# Patient Record
Sex: Female | Born: 1960
Health system: Southern US, Community
[De-identification: ages and names within clinical notes are randomized; demographics above are authoritative.]

## PROBLEM LIST (undated history)

## (undated) DIAGNOSIS — I700 Atherosclerosis of aorta: Secondary | ICD-10-CM

## (undated) DIAGNOSIS — H5440 Blindness, one eye, unspecified eye: Secondary | ICD-10-CM

## (undated) DIAGNOSIS — R0609 Other forms of dyspnea: Secondary | ICD-10-CM

## (undated) DIAGNOSIS — K219 Gastro-esophageal reflux disease without esophagitis: Secondary | ICD-10-CM

## (undated) DIAGNOSIS — I2510 Atherosclerotic heart disease of native coronary artery without angina pectoris: Secondary | ICD-10-CM

## (undated) DIAGNOSIS — F32A Depression, unspecified: Secondary | ICD-10-CM

## (undated) DIAGNOSIS — Z951 Presence of aortocoronary bypass graft: Secondary | ICD-10-CM

## (undated) DIAGNOSIS — N179 Acute kidney failure, unspecified: Secondary | ICD-10-CM

## (undated) DIAGNOSIS — A6000 Herpesviral infection of urogenital system, unspecified: Secondary | ICD-10-CM

## (undated) DIAGNOSIS — H43811 Vitreous degeneration, right eye: Secondary | ICD-10-CM

## (undated) DIAGNOSIS — I509 Heart failure, unspecified: Secondary | ICD-10-CM

## (undated) DIAGNOSIS — N189 Chronic kidney disease, unspecified: Secondary | ICD-10-CM

## (undated) DIAGNOSIS — G4730 Sleep apnea, unspecified: Secondary | ICD-10-CM

## (undated) DIAGNOSIS — I671 Cerebral aneurysm, nonruptured: Secondary | ICD-10-CM

## (undated) DIAGNOSIS — N2581 Secondary hyperparathyroidism of renal origin: Secondary | ICD-10-CM

## (undated) DIAGNOSIS — J45909 Unspecified asthma, uncomplicated: Secondary | ICD-10-CM

## (undated) DIAGNOSIS — R0600 Dyspnea, unspecified: Secondary | ICD-10-CM

## (undated) DIAGNOSIS — J449 Chronic obstructive pulmonary disease, unspecified: Secondary | ICD-10-CM

## (undated) DIAGNOSIS — Z86718 Personal history of other venous thrombosis and embolism: Secondary | ICD-10-CM

## (undated) DIAGNOSIS — I208 Other forms of angina pectoris: Secondary | ICD-10-CM

## (undated) DIAGNOSIS — N184 Chronic kidney disease, stage 4 (severe): Secondary | ICD-10-CM

## (undated) DIAGNOSIS — F419 Anxiety disorder, unspecified: Secondary | ICD-10-CM

## (undated) DIAGNOSIS — D649 Anemia, unspecified: Secondary | ICD-10-CM

## (undated) DIAGNOSIS — D352 Benign neoplasm of pituitary gland: Secondary | ICD-10-CM

## (undated) DIAGNOSIS — E785 Hyperlipidemia, unspecified: Secondary | ICD-10-CM

## (undated) DIAGNOSIS — E119 Type 2 diabetes mellitus without complications: Secondary | ICD-10-CM

## (undated) DIAGNOSIS — I2089 Other forms of angina pectoris: Secondary | ICD-10-CM

## (undated) DIAGNOSIS — I5020 Unspecified systolic (congestive) heart failure: Secondary | ICD-10-CM

## (undated) DIAGNOSIS — R2681 Unsteadiness on feet: Secondary | ICD-10-CM

## (undated) DIAGNOSIS — N186 End stage renal disease: Secondary | ICD-10-CM

## (undated) DIAGNOSIS — I10 Essential (primary) hypertension: Secondary | ICD-10-CM

## (undated) DIAGNOSIS — J810 Acute pulmonary edema: Secondary | ICD-10-CM

## (undated) DIAGNOSIS — I639 Cerebral infarction, unspecified: Secondary | ICD-10-CM

## (undated) HISTORY — PX: BREAST EXCISIONAL BIOPSY: SUR124

## (undated) HISTORY — PX: BRAIN SURGERY: SHX531

## (undated) HISTORY — DX: Heart failure, unspecified: I50.9

---

## 1984-12-20 HISTORY — PX: BRAIN SURGERY: SHX531

## 2012-12-20 DIAGNOSIS — I639 Cerebral infarction, unspecified: Secondary | ICD-10-CM

## 2012-12-20 HISTORY — DX: Cerebral infarction, unspecified: I63.9

## 2013-11-17 ENCOUNTER — Emergency Department: Payer: Self-pay | Admitting: Emergency Medicine

## 2013-11-18 LAB — BASIC METABOLIC PANEL
Anion Gap: 7 (ref 7–16)
BUN: 11 mg/dL (ref 7–18)
Calcium, Total: 9.4 mg/dL (ref 8.5–10.1)
Chloride: 98 mmol/L (ref 98–107)
Co2: 29 mmol/L (ref 21–32)
EGFR (Non-African Amer.): >60
Glucose: 348 mg/dL — ABNORMAL HIGH (ref 65–99)
Potassium: 3.8 mmol/L (ref 3.5–5.1)
Sodium: 134 mmol/L — ABNORMAL LOW (ref 136–145)

## 2013-11-18 LAB — CBC
HCT: 37.9 % (ref 35.0–47.0)
MCHC: 33.1 g/dL (ref 32.0–36.0)
MCV: 83 fL (ref 80–100)
RBC: 4.55 10*6/uL (ref 3.80–5.20)
RDW: 14.1 % (ref 11.5–14.5)
WBC: 14.4 10*3/uL — ABNORMAL HIGH (ref 3.6–11.0)

## 2013-11-18 LAB — PROTIME-INR: INR: 1.7

## 2013-11-18 IMAGING — CT CT HEAD WITH CONTRAST
3 of 9 series · 15 of 47 positions shown, 18 images · IV contrast (isovue)
Comparison: \None available

CLINICAL DATA: Headache, history of venous sinus thrombosis

EXAM:
CT HEAD WITHOUT AND WITH CONTRAST
TECHNIQUE: Contiguous axial images were obtained from the base of the skull
through the vertex without and with intravenous contrast
Contiguous axial images were obtained from the base of the skull
through the vertex prior to and following the administration of
intravenous contrast .
CONTRAST:  75 cc of Isovue 300.

[Series 7: head wo thins · axial · 0.42mm/px · z∈[-141,-15]mm · 12 of 96 slices shown, 15 images]
[im 6/96  brain]
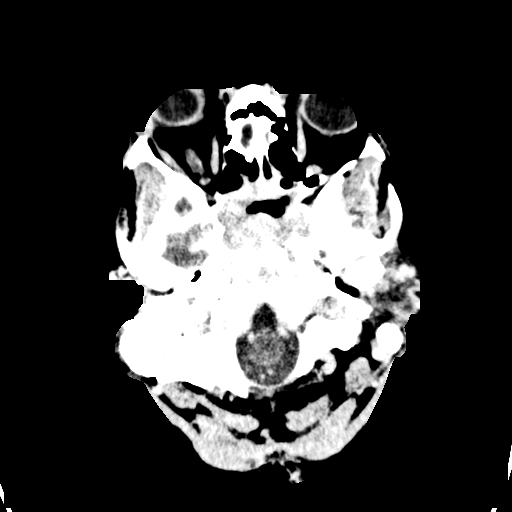
[im 6/96  bone]
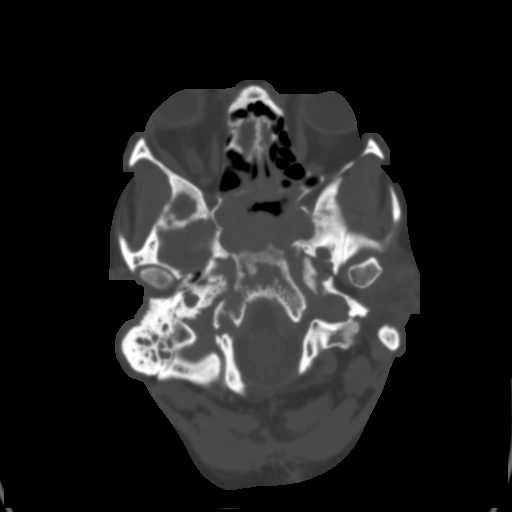
[im 17/96  brain]
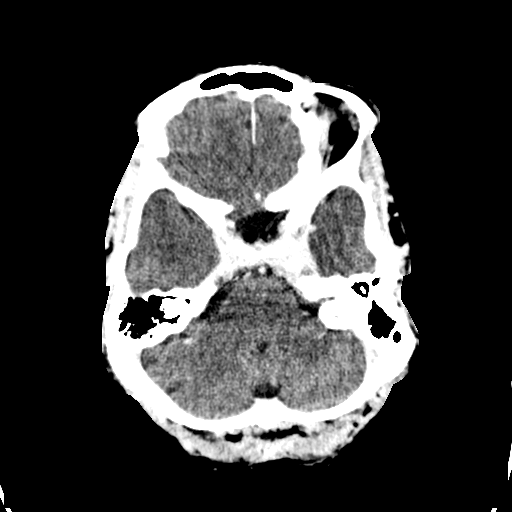
[im 23/96  brain]
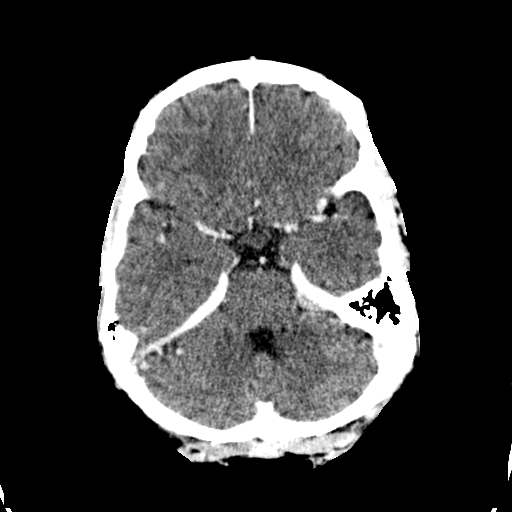
[im 28/96  brain]
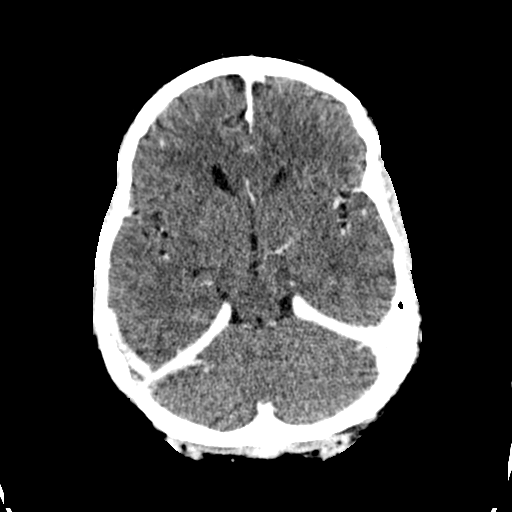
[im 40/96  brain]
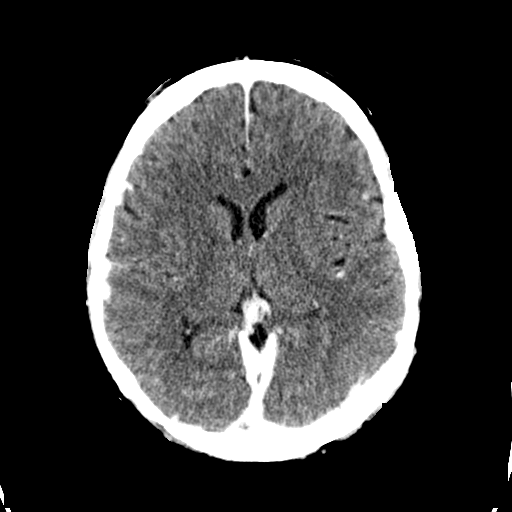
[im 40/96  bone]
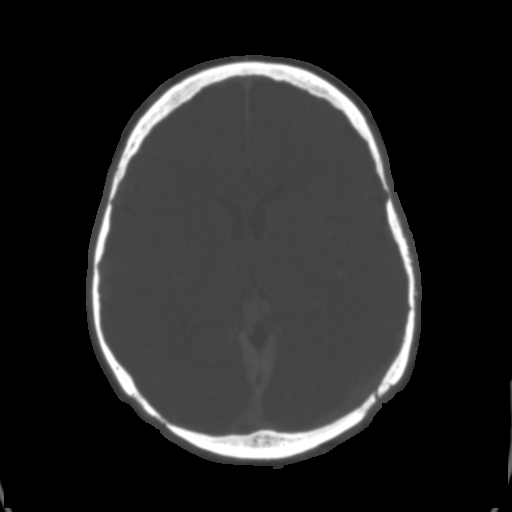
[im 45/96  brain]
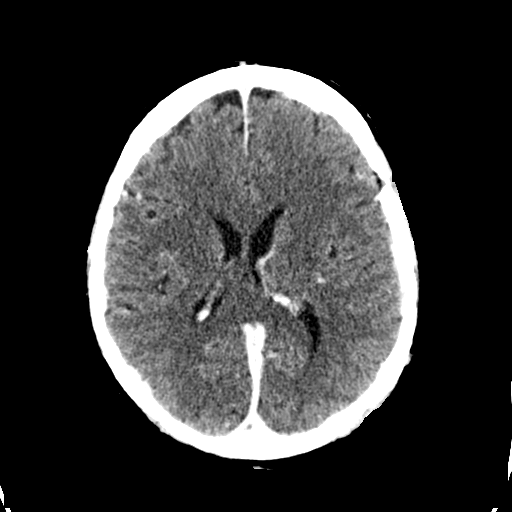
[im 51/96  brain]
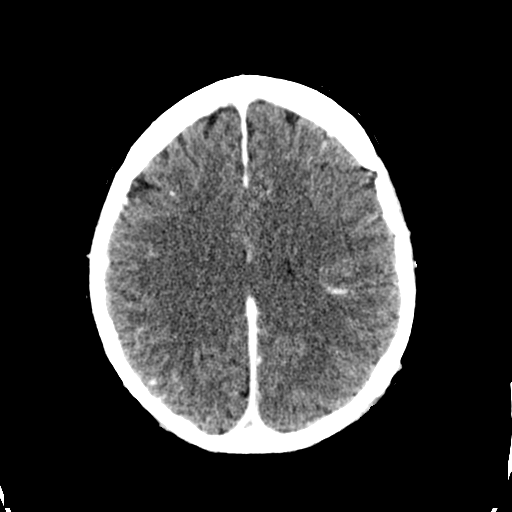
[im 62/96  brain]
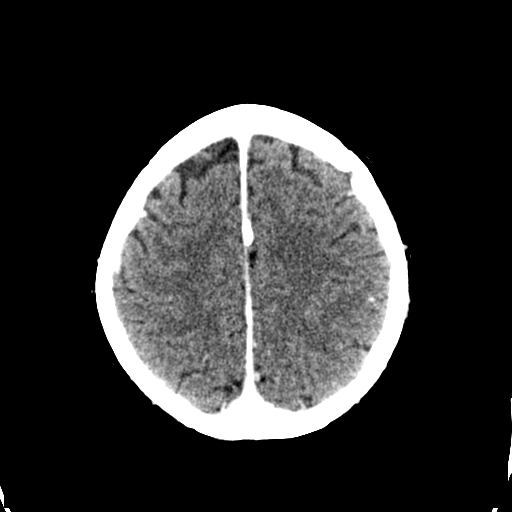
[im 68/96  brain]
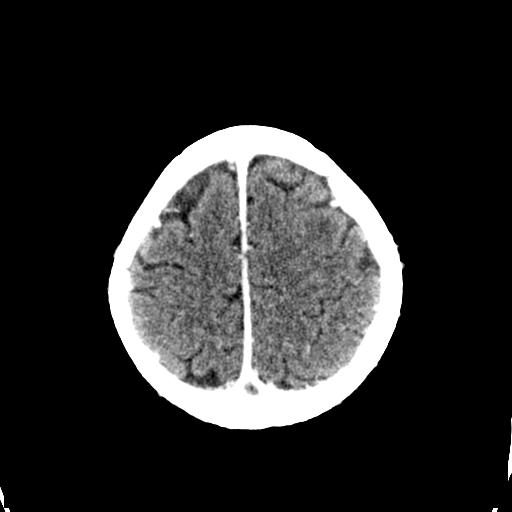
[im 68/96  bone]
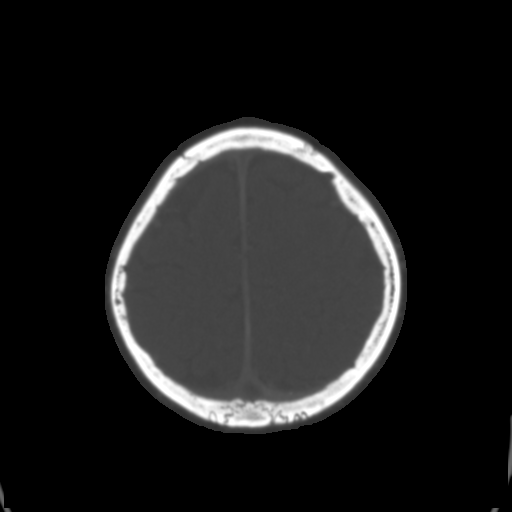
[im 73/96  brain]
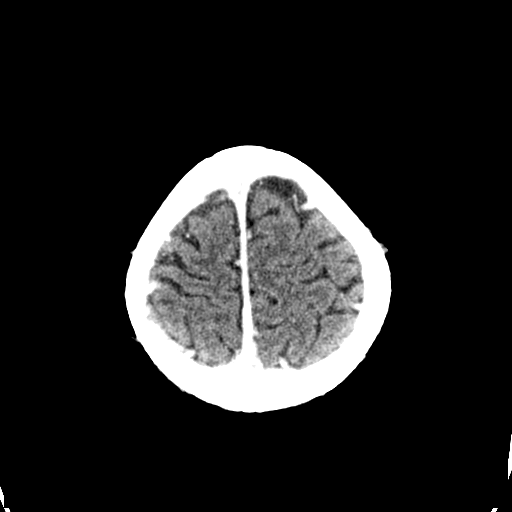
[im 84/96  brain]
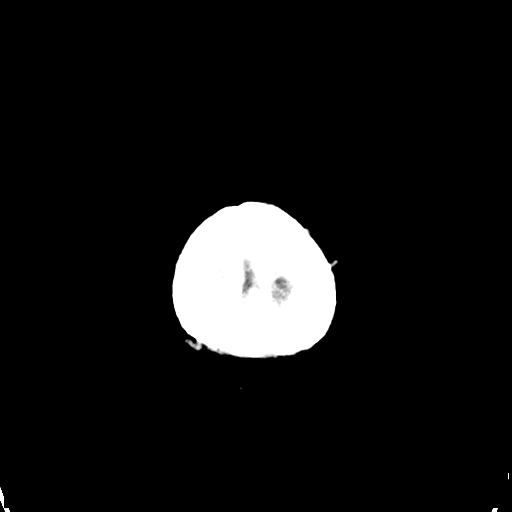
[im 90/96  brain]
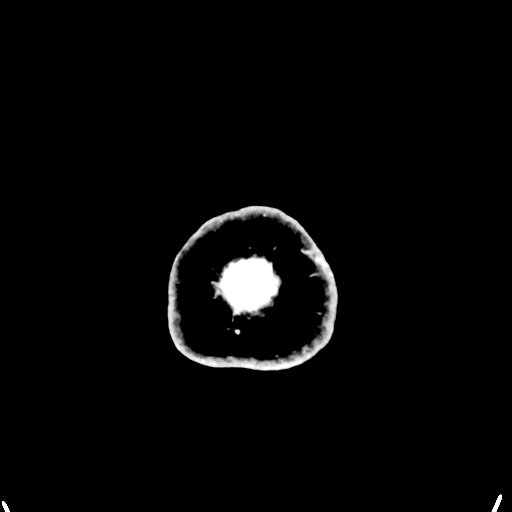

[Series 605: cor thin · coronal · 0.43mm/px · 2 of 161 slices shown]
[im 54/161  brain]
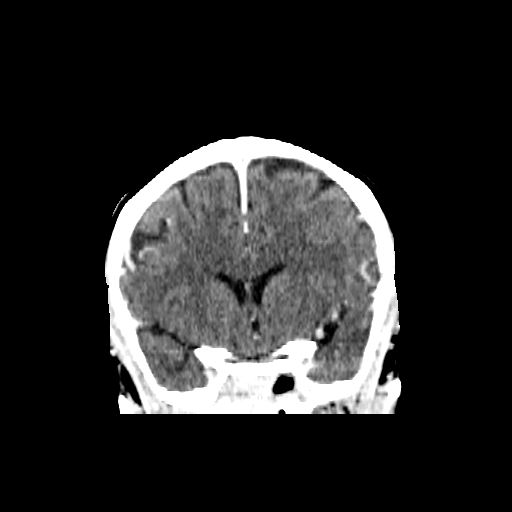
[im 107/161  brain]
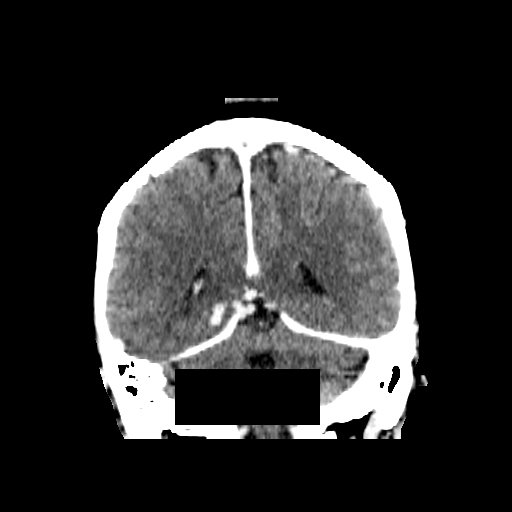

[Series 606: sag thin · sagittal · 0.43mm/px · 1 of 141 slices shown]
[im 71/141  brain]
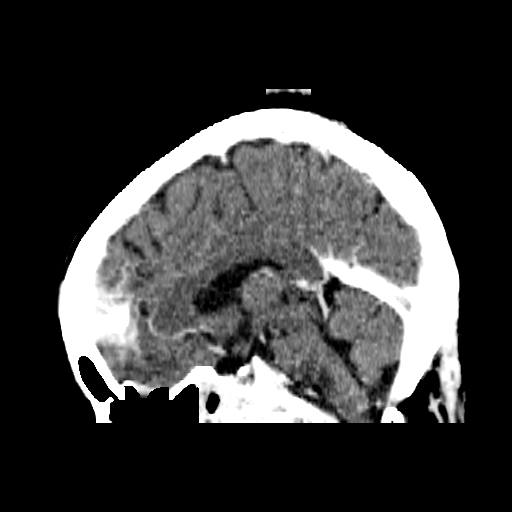

[15 of 47 positions shown; findings below may reference images not displayed]

FINDINGS: Precontrast images of the brain demonstrate no evidence of acute
intracranial hemorrhage or infarct. No mass or midline shift. On
post-contrast sequences, a long segment filling defect is seen
within the sagittal sinus extending posteriorly into the torcula
with extension into the bilateral transverse sinuses, left greater
than right. There is extension into the straight sinus as well This
clot is nonocclusive. No associated cortical venous were deep venous
infarct identified.

No mass or midline shift. Ventricles are normal in size without
evidence of hydrocephalus. The intracranial the arterial vasculature
is grossly within normal limits.

The calvarium is intact.  Orbits are within normal limits.

Postsurgical changes are seen within the sellar region, compatible
with history prior pituitary tumor removal.
IMPRESSION: 1. Findings compatible with nonocclusive cerebral venous sinus
thrombosis involving the sagittal sinus, bilateral transverse
sinuses, and straight sinus. No CT evidence of associated deep or
cortical venous infarct.
2. Postoperative changes from prior transsphenoidal pituitary mass
resection.

## 2014-08-30 DIAGNOSIS — D649 Anemia, unspecified: Secondary | ICD-10-CM | POA: Insufficient documentation

## 2014-08-30 DIAGNOSIS — F329 Major depressive disorder, single episode, unspecified: Secondary | ICD-10-CM | POA: Insufficient documentation

## 2014-08-30 DIAGNOSIS — F32A Depression, unspecified: Secondary | ICD-10-CM | POA: Insufficient documentation

## 2014-12-03 DIAGNOSIS — H209 Unspecified iridocyclitis: Secondary | ICD-10-CM | POA: Insufficient documentation

## 2014-12-18 DIAGNOSIS — H0520 Unspecified exophthalmos: Secondary | ICD-10-CM | POA: Insufficient documentation

## 2015-02-13 DIAGNOSIS — H04123 Dry eye syndrome of bilateral lacrimal glands: Secondary | ICD-10-CM | POA: Insufficient documentation

## 2015-10-02 DIAGNOSIS — G8929 Other chronic pain: Secondary | ICD-10-CM | POA: Insufficient documentation

## 2015-10-02 DIAGNOSIS — R519 Headache, unspecified: Secondary | ICD-10-CM | POA: Insufficient documentation

## 2015-10-24 DIAGNOSIS — H2513 Age-related nuclear cataract, bilateral: Secondary | ICD-10-CM | POA: Insufficient documentation

## 2016-01-03 DIAGNOSIS — E1151 Type 2 diabetes mellitus with diabetic peripheral angiopathy without gangrene: Secondary | ICD-10-CM | POA: Insufficient documentation

## 2016-01-09 DIAGNOSIS — H43811 Vitreous degeneration, right eye: Secondary | ICD-10-CM | POA: Insufficient documentation

## 2016-12-20 DIAGNOSIS — Z951 Presence of aortocoronary bypass graft: Secondary | ICD-10-CM

## 2016-12-20 DIAGNOSIS — I219 Acute myocardial infarction, unspecified: Secondary | ICD-10-CM

## 2016-12-20 HISTORY — PX: CORONARY ARTERY BYPASS GRAFT: SHX141

## 2016-12-20 HISTORY — DX: Acute myocardial infarction, unspecified: I21.9

## 2016-12-20 HISTORY — DX: Presence of aortocoronary bypass graft: Z95.1

## 2016-12-30 DIAGNOSIS — I82409 Acute embolism and thrombosis of unspecified deep veins of unspecified lower extremity: Secondary | ICD-10-CM | POA: Insufficient documentation

## 2016-12-30 DIAGNOSIS — Z86718 Personal history of other venous thrombosis and embolism: Secondary | ICD-10-CM | POA: Insufficient documentation

## 2016-12-30 HISTORY — DX: Acute embolism and thrombosis of unspecified deep veins of unspecified lower extremity: I82.409

## 2017-05-07 DIAGNOSIS — A6000 Herpesviral infection of urogenital system, unspecified: Secondary | ICD-10-CM | POA: Insufficient documentation

## 2017-09-09 DIAGNOSIS — E113291 Type 2 diabetes mellitus with mild nonproliferative diabetic retinopathy without macular edema, right eye: Secondary | ICD-10-CM | POA: Insufficient documentation

## 2017-10-21 DIAGNOSIS — I1310 Hypertensive heart and chronic kidney disease without heart failure, with stage 1 through stage 4 chronic kidney disease, or unspecified chronic kidney disease: Secondary | ICD-10-CM | POA: Insufficient documentation

## 2017-10-21 DIAGNOSIS — N183 Chronic kidney disease, stage 3 unspecified: Secondary | ICD-10-CM | POA: Insufficient documentation

## 2018-12-27 DIAGNOSIS — H4720 Unspecified optic atrophy: Secondary | ICD-10-CM | POA: Diagnosis not present

## 2019-03-21 DIAGNOSIS — Z8616 Personal history of COVID-19: Secondary | ICD-10-CM

## 2019-03-21 HISTORY — DX: Personal history of COVID-19: Z86.16

## 2019-03-24 DIAGNOSIS — I249 Acute ischemic heart disease, unspecified: Secondary | ICD-10-CM | POA: Insufficient documentation

## 2019-03-24 DIAGNOSIS — J1282 Pneumonia due to coronavirus disease 2019: Secondary | ICD-10-CM | POA: Insufficient documentation

## 2019-03-24 HISTORY — DX: Acute ischemic heart disease, unspecified: I24.9

## 2019-04-10 DIAGNOSIS — G4733 Obstructive sleep apnea (adult) (pediatric): Secondary | ICD-10-CM | POA: Diagnosis present

## 2019-04-10 DIAGNOSIS — F330 Major depressive disorder, recurrent, mild: Secondary | ICD-10-CM | POA: Insufficient documentation

## 2019-06-14 ENCOUNTER — Ambulatory Visit: Payer: Medicare PPO | Attending: Internal Medicine

## 2019-06-14 DIAGNOSIS — G4710 Hypersomnia, unspecified: Secondary | ICD-10-CM | POA: Diagnosis present

## 2019-06-15 ENCOUNTER — Other Ambulatory Visit: Payer: Self-pay

## 2019-07-30 DIAGNOSIS — Z1231 Encounter for screening mammogram for malignant neoplasm of breast: Secondary | ICD-10-CM | POA: Diagnosis not present

## 2019-07-30 DIAGNOSIS — Z124 Encounter for screening for malignant neoplasm of cervix: Secondary | ICD-10-CM | POA: Diagnosis not present

## 2019-07-30 DIAGNOSIS — Z01419 Encounter for gynecological examination (general) (routine) without abnormal findings: Secondary | ICD-10-CM | POA: Diagnosis not present

## 2019-07-30 DIAGNOSIS — Z113 Encounter for screening for infections with a predominantly sexual mode of transmission: Secondary | ICD-10-CM | POA: Diagnosis not present

## 2019-07-30 DIAGNOSIS — H05243 Constant exophthalmos, bilateral: Secondary | ICD-10-CM | POA: Diagnosis not present

## 2019-07-31 ENCOUNTER — Other Ambulatory Visit: Payer: Self-pay | Admitting: Obstetrics and Gynecology

## 2019-07-31 DIAGNOSIS — Z1231 Encounter for screening mammogram for malignant neoplasm of breast: Secondary | ICD-10-CM

## 2019-08-17 DIAGNOSIS — Z794 Long term (current) use of insulin: Secondary | ICD-10-CM | POA: Diagnosis not present

## 2019-08-17 DIAGNOSIS — I2510 Atherosclerotic heart disease of native coronary artery without angina pectoris: Secondary | ICD-10-CM | POA: Diagnosis not present

## 2019-08-17 DIAGNOSIS — E1165 Type 2 diabetes mellitus with hyperglycemia: Secondary | ICD-10-CM | POA: Diagnosis not present

## 2019-08-17 DIAGNOSIS — Z6829 Body mass index (BMI) 29.0-29.9, adult: Secondary | ICD-10-CM | POA: Diagnosis not present

## 2019-08-17 DIAGNOSIS — E785 Hyperlipidemia, unspecified: Secondary | ICD-10-CM | POA: Diagnosis not present

## 2019-08-17 DIAGNOSIS — H5462 Unqualified visual loss, left eye, normal vision right eye: Secondary | ICD-10-CM | POA: Diagnosis not present

## 2019-08-17 DIAGNOSIS — Z809 Family history of malignant neoplasm, unspecified: Secondary | ICD-10-CM | POA: Diagnosis not present

## 2019-08-17 DIAGNOSIS — G4700 Insomnia, unspecified: Secondary | ICD-10-CM | POA: Diagnosis not present

## 2019-08-17 DIAGNOSIS — I10 Essential (primary) hypertension: Secondary | ICD-10-CM | POA: Diagnosis not present

## 2019-08-17 DIAGNOSIS — Z8679 Personal history of other diseases of the circulatory system: Secondary | ICD-10-CM | POA: Diagnosis not present

## 2019-08-22 DIAGNOSIS — R6889 Other general symptoms and signs: Secondary | ICD-10-CM | POA: Diagnosis not present

## 2019-09-20 DIAGNOSIS — E785 Hyperlipidemia, unspecified: Secondary | ICD-10-CM | POA: Diagnosis not present

## 2019-09-20 DIAGNOSIS — E1165 Type 2 diabetes mellitus with hyperglycemia: Secondary | ICD-10-CM | POA: Diagnosis not present

## 2019-09-20 DIAGNOSIS — R6889 Other general symptoms and signs: Secondary | ICD-10-CM | POA: Diagnosis not present

## 2019-09-20 DIAGNOSIS — I10 Essential (primary) hypertension: Secondary | ICD-10-CM | POA: Diagnosis not present

## 2019-09-20 DIAGNOSIS — Z794 Long term (current) use of insulin: Secondary | ICD-10-CM | POA: Diagnosis not present

## 2019-09-20 DIAGNOSIS — N1832 Chronic kidney disease, stage 3b: Secondary | ICD-10-CM | POA: Diagnosis not present

## 2019-10-09 DIAGNOSIS — R6889 Other general symptoms and signs: Secondary | ICD-10-CM | POA: Diagnosis not present

## 2019-10-09 DIAGNOSIS — I25118 Atherosclerotic heart disease of native coronary artery with other forms of angina pectoris: Secondary | ICD-10-CM | POA: Diagnosis not present

## 2019-10-09 DIAGNOSIS — E1159 Type 2 diabetes mellitus with other circulatory complications: Secondary | ICD-10-CM | POA: Diagnosis not present

## 2019-10-09 DIAGNOSIS — Z794 Long term (current) use of insulin: Secondary | ICD-10-CM | POA: Diagnosis not present

## 2019-10-19 DIAGNOSIS — R6889 Other general symptoms and signs: Secondary | ICD-10-CM | POA: Diagnosis not present

## 2019-10-19 DIAGNOSIS — J42 Unspecified chronic bronchitis: Secondary | ICD-10-CM | POA: Diagnosis not present

## 2019-10-19 DIAGNOSIS — E1159 Type 2 diabetes mellitus with other circulatory complications: Secondary | ICD-10-CM | POA: Diagnosis not present

## 2019-10-19 DIAGNOSIS — G4733 Obstructive sleep apnea (adult) (pediatric): Secondary | ICD-10-CM | POA: Diagnosis not present

## 2019-10-19 DIAGNOSIS — Z23 Encounter for immunization: Secondary | ICD-10-CM | POA: Diagnosis not present

## 2019-10-19 DIAGNOSIS — I10 Essential (primary) hypertension: Secondary | ICD-10-CM | POA: Diagnosis not present

## 2019-10-19 DIAGNOSIS — Z780 Asymptomatic menopausal state: Secondary | ICD-10-CM | POA: Diagnosis not present

## 2019-10-19 DIAGNOSIS — Z794 Long term (current) use of insulin: Secondary | ICD-10-CM | POA: Diagnosis not present

## 2019-10-19 DIAGNOSIS — I25118 Atherosclerotic heart disease of native coronary artery with other forms of angina pectoris: Secondary | ICD-10-CM | POA: Diagnosis not present

## 2019-10-19 DIAGNOSIS — F330 Major depressive disorder, recurrent, mild: Secondary | ICD-10-CM | POA: Diagnosis not present

## 2019-10-22 DIAGNOSIS — R6889 Other general symptoms and signs: Secondary | ICD-10-CM | POA: Diagnosis not present

## 2019-10-22 DIAGNOSIS — R69 Illness, unspecified: Secondary | ICD-10-CM | POA: Diagnosis not present

## 2019-11-01 DIAGNOSIS — R809 Proteinuria, unspecified: Secondary | ICD-10-CM | POA: Diagnosis not present

## 2019-11-01 DIAGNOSIS — Z794 Long term (current) use of insulin: Secondary | ICD-10-CM | POA: Diagnosis not present

## 2019-11-01 DIAGNOSIS — E1165 Type 2 diabetes mellitus with hyperglycemia: Secondary | ICD-10-CM | POA: Diagnosis not present

## 2019-11-01 DIAGNOSIS — R6889 Other general symptoms and signs: Secondary | ICD-10-CM | POA: Diagnosis not present

## 2019-11-01 DIAGNOSIS — E785 Hyperlipidemia, unspecified: Secondary | ICD-10-CM | POA: Diagnosis not present

## 2019-11-01 DIAGNOSIS — I10 Essential (primary) hypertension: Secondary | ICD-10-CM | POA: Diagnosis not present

## 2019-11-19 DIAGNOSIS — G4733 Obstructive sleep apnea (adult) (pediatric): Secondary | ICD-10-CM | POA: Diagnosis not present

## 2019-11-19 DIAGNOSIS — J42 Unspecified chronic bronchitis: Secondary | ICD-10-CM | POA: Diagnosis not present

## 2019-11-19 DIAGNOSIS — Z1211 Encounter for screening for malignant neoplasm of colon: Secondary | ICD-10-CM | POA: Diagnosis not present

## 2019-11-19 DIAGNOSIS — I25118 Atherosclerotic heart disease of native coronary artery with other forms of angina pectoris: Secondary | ICD-10-CM | POA: Diagnosis not present

## 2019-11-19 DIAGNOSIS — Z794 Long term (current) use of insulin: Secondary | ICD-10-CM | POA: Diagnosis not present

## 2019-11-19 DIAGNOSIS — I10 Essential (primary) hypertension: Secondary | ICD-10-CM | POA: Diagnosis not present

## 2019-11-19 DIAGNOSIS — E1159 Type 2 diabetes mellitus with other circulatory complications: Secondary | ICD-10-CM | POA: Diagnosis not present

## 2019-11-23 ENCOUNTER — Emergency Department: Payer: Medicare HMO

## 2019-11-23 ENCOUNTER — Other Ambulatory Visit: Payer: Self-pay

## 2019-11-23 ENCOUNTER — Encounter: Payer: Self-pay | Admitting: Emergency Medicine

## 2019-11-23 ENCOUNTER — Inpatient Hospital Stay
Admission: EM | Admit: 2019-11-23 | Discharge: 2019-11-26 | DRG: 305 | Disposition: A | Discharge status: Home Health | Payer: Medicare HMO | Attending: Internal Medicine | Admitting: Internal Medicine

## 2019-11-23 ENCOUNTER — Observation Stay: Payer: Medicare HMO

## 2019-11-23 DIAGNOSIS — M549 Dorsalgia, unspecified: Secondary | ICD-10-CM | POA: Diagnosis present

## 2019-11-23 DIAGNOSIS — Z888 Allergy status to other drugs, medicaments and biological substances status: Secondary | ICD-10-CM | POA: Diagnosis not present

## 2019-11-23 DIAGNOSIS — N179 Acute kidney failure, unspecified: Secondary | ICD-10-CM | POA: Diagnosis not present

## 2019-11-23 DIAGNOSIS — R079 Chest pain, unspecified: Secondary | ICD-10-CM | POA: Diagnosis present

## 2019-11-23 DIAGNOSIS — H5462 Unqualified visual loss, left eye, normal vision right eye: Secondary | ICD-10-CM | POA: Diagnosis present

## 2019-11-23 DIAGNOSIS — I499 Cardiac arrhythmia, unspecified: Secondary | ICD-10-CM | POA: Diagnosis not present

## 2019-11-23 DIAGNOSIS — I10 Essential (primary) hypertension: Secondary | ICD-10-CM | POA: Diagnosis not present

## 2019-11-23 DIAGNOSIS — G8929 Other chronic pain: Secondary | ICD-10-CM | POA: Diagnosis not present

## 2019-11-23 DIAGNOSIS — U071 COVID-19: Secondary | ICD-10-CM | POA: Diagnosis not present

## 2019-11-23 DIAGNOSIS — I252 Old myocardial infarction: Secondary | ICD-10-CM

## 2019-11-23 DIAGNOSIS — N1832 Chronic kidney disease, stage 3b: Secondary | ICD-10-CM | POA: Diagnosis present

## 2019-11-23 DIAGNOSIS — Z87891 Personal history of nicotine dependence: Secondary | ICD-10-CM | POA: Diagnosis not present

## 2019-11-23 DIAGNOSIS — Z20828 Contact with and (suspected) exposure to other viral communicable diseases: Secondary | ICD-10-CM | POA: Diagnosis not present

## 2019-11-23 DIAGNOSIS — E11319 Type 2 diabetes mellitus with unspecified diabetic retinopathy without macular edema: Secondary | ICD-10-CM | POA: Diagnosis present

## 2019-11-23 DIAGNOSIS — I161 Hypertensive emergency: Principal | ICD-10-CM | POA: Diagnosis present

## 2019-11-23 DIAGNOSIS — R0781 Pleurodynia: Secondary | ICD-10-CM | POA: Diagnosis not present

## 2019-11-23 DIAGNOSIS — I2510 Atherosclerotic heart disease of native coronary artery without angina pectoris: Secondary | ICD-10-CM | POA: Diagnosis present

## 2019-11-23 DIAGNOSIS — Z794 Long term (current) use of insulin: Secondary | ICD-10-CM | POA: Diagnosis not present

## 2019-11-23 DIAGNOSIS — E1165 Type 2 diabetes mellitus with hyperglycemia: Secondary | ICD-10-CM | POA: Diagnosis not present

## 2019-11-23 DIAGNOSIS — I129 Hypertensive chronic kidney disease with stage 1 through stage 4 chronic kidney disease, or unspecified chronic kidney disease: Secondary | ICD-10-CM | POA: Diagnosis present

## 2019-11-23 DIAGNOSIS — E876 Hypokalemia: Secondary | ICD-10-CM | POA: Diagnosis not present

## 2019-11-23 DIAGNOSIS — I248 Other forms of acute ischemic heart disease: Secondary | ICD-10-CM | POA: Diagnosis present

## 2019-11-23 DIAGNOSIS — E785 Hyperlipidemia, unspecified: Secondary | ICD-10-CM | POA: Diagnosis present

## 2019-11-23 DIAGNOSIS — I160 Hypertensive urgency: Secondary | ICD-10-CM | POA: Diagnosis present

## 2019-11-23 DIAGNOSIS — Z951 Presence of aortocoronary bypass graft: Secondary | ICD-10-CM | POA: Diagnosis not present

## 2019-11-23 DIAGNOSIS — R42 Dizziness and giddiness: Secondary | ICD-10-CM | POA: Diagnosis not present

## 2019-11-23 DIAGNOSIS — R0789 Other chest pain: Secondary | ICD-10-CM | POA: Diagnosis not present

## 2019-11-23 DIAGNOSIS — Z8619 Personal history of other infectious and parasitic diseases: Secondary | ICD-10-CM | POA: Diagnosis not present

## 2019-11-23 DIAGNOSIS — E1122 Type 2 diabetes mellitus with diabetic chronic kidney disease: Secondary | ICD-10-CM | POA: Diagnosis present

## 2019-11-23 HISTORY — DX: Hyperlipidemia, unspecified: E78.5

## 2019-11-23 HISTORY — DX: Essential (primary) hypertension: I10

## 2019-11-23 HISTORY — DX: Type 2 diabetes mellitus without complications: E11.9

## 2019-11-23 HISTORY — DX: Blindness, one eye, unspecified eye: H54.40

## 2019-11-23 LAB — URINE DRUG SCREEN, QUALITATIVE (ARMC ONLY)
Amphetamines, Ur Screen: NOT DETECTED
Barbiturates, Ur Screen: NOT DETECTED
Benzodiazepine, Ur Scrn: NOT DETECTED
Cannabinoid 50 Ng, Ur ~~LOC~~: NOT DETECTED
Cocaine Metabolite,Ur ~~LOC~~: NOT DETECTED
MDMA (Ecstasy)Ur Screen: NOT DETECTED
Methadone Scn, Ur: NOT DETECTED
Opiate, Ur Screen: NOT DETECTED
Phencyclidine (PCP) Ur S: NOT DETECTED
Tricyclic, Ur Screen: NOT DETECTED

## 2019-11-23 LAB — BASIC METABOLIC PANEL
Anion gap: 12 (ref 5–15)
BUN: 12 mg/dL (ref 6–20)
CO2: 21 mmol/L — ABNORMAL LOW (ref 22–32)
Calcium: 8.2 mg/dL — ABNORMAL LOW (ref 8.9–10.3)
Chloride: 104 mmol/L (ref 98–111)
Creatinine, Ser: 1.28 mg/dL — ABNORMAL HIGH (ref 0.44–1.00)
GFR calc Af Amer: 54 mL/min — ABNORMAL LOW (ref >60)
GFR calc non Af Amer: 46 mL/min — ABNORMAL LOW (ref >60)
Glucose, Bld: 462 mg/dL — ABNORMAL HIGH (ref 70–99)
Potassium: 3.9 mmol/L (ref 3.5–5.1)
Sodium: 137 mmol/L (ref 135–145)

## 2019-11-23 LAB — TROPONIN I (HIGH SENSITIVITY)
Troponin I (High Sensitivity): 36 ng/L — ABNORMAL HIGH (ref <18)
Troponin I (High Sensitivity): 35 ng/L — ABNORMAL HIGH (ref <18)
Troponin I (High Sensitivity): 38 ng/L — ABNORMAL HIGH (ref <18)
Troponin I (High Sensitivity): 39 ng/L — ABNORMAL HIGH (ref <18)

## 2019-11-23 LAB — HEMOGLOBIN A1C
Hgb A1c MFr Bld: 14.6 % — ABNORMAL HIGH (ref 4.8–5.6)
Mean Plasma Glucose: 372.32 mg/dL

## 2019-11-23 LAB — CBC
HCT: 36.4 % (ref 36.0–46.0)
Hemoglobin: 12.3 g/dL (ref 12.0–15.0)
MCH: 29.2 pg (ref 26.0–34.0)
MCHC: 33.8 g/dL (ref 30.0–36.0)
MCV: 86.5 fL (ref 80.0–100.0)
Platelets: 310 10*3/uL (ref 150–400)
RBC: 4.21 MIL/uL (ref 3.87–5.11)
RDW: 12.4 % (ref 11.5–15.5)
WBC: 7.9 10*3/uL (ref 4.0–10.5)
nRBC: 0 % (ref 0.0–0.2)

## 2019-11-23 LAB — SARS CORONAVIRUS 2 BY RT PCR (HOSPITAL ORDER, PERFORMED IN ~~LOC~~ HOSPITAL LAB): SARS Coronavirus 2: NEGATIVE

## 2019-11-23 LAB — MRSA PCR SCREENING: MRSA by PCR: POSITIVE — AB

## 2019-11-23 LAB — GLUCOSE, CAPILLARY
Glucose-Capillary: 441 mg/dL — ABNORMAL HIGH (ref 70–99)
Glucose-Capillary: 359 mg/dL — ABNORMAL HIGH (ref 70–99)

## 2019-11-23 IMAGING — CT CT ANGIO CHEST
2 of 6 series · 18 of 46 positions shown · IV contrast (APPLIED)
Comparison: Chest x-ray [DATE]

CLINICAL DATA: Pleuritic chest pain

EXAM:
CT ANGIOGRAPHY CHEST WITH CONTRAST
TECHNIQUE: Multidetector CT imaging of the chest was performed using the
standard protocol during bolus administration of intravenous
contrast. Multiplanar CT image reconstructions and MIPs were
obtained to evaluate the vascular anatomy.
CONTRAST:  75mL OMNIPAQUE IOHEXOL 350 MG/ML SOLN

[Series 5: thins · axial · 0.63mm/px · z∈[+945,+1180]mm · 16 of 259 slices shown]
[im 12/259  lung]
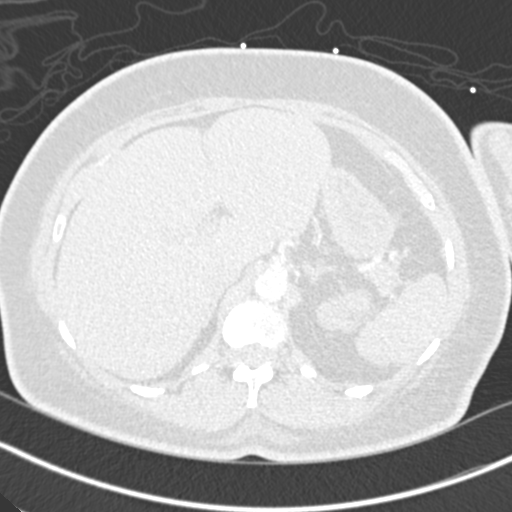
[im 34/259  soft-tissue]
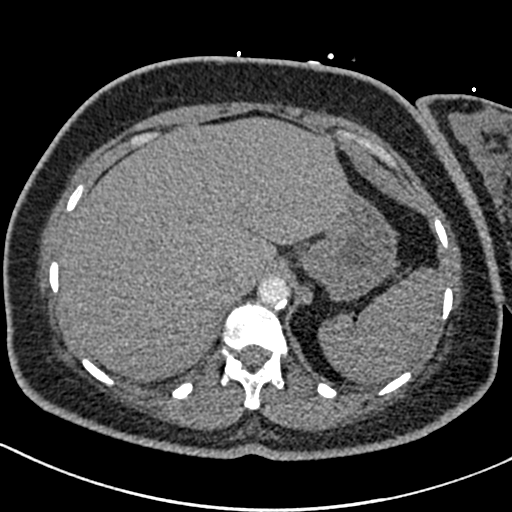
[im 45/259  lung]
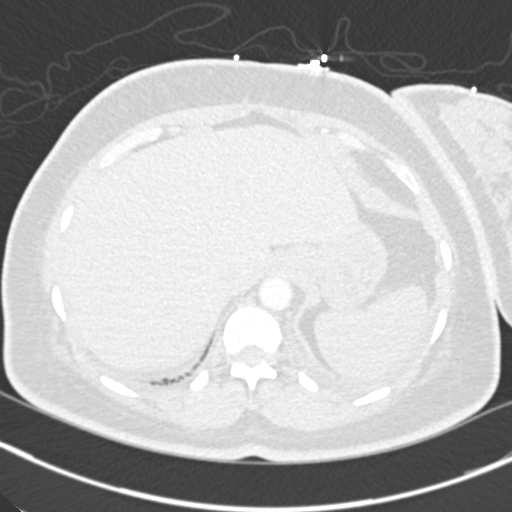
[im 57/259  soft-tissue]
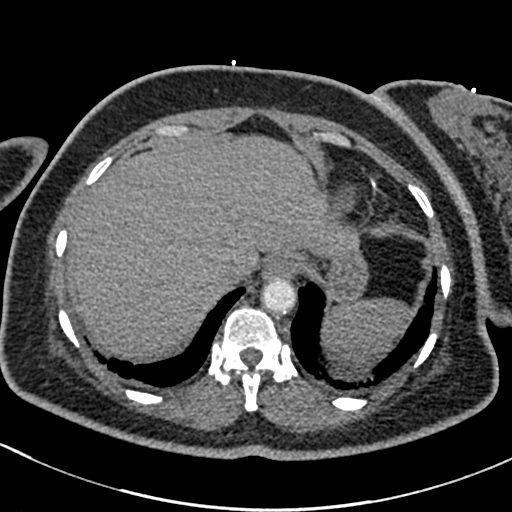
[im 79/259  lung]
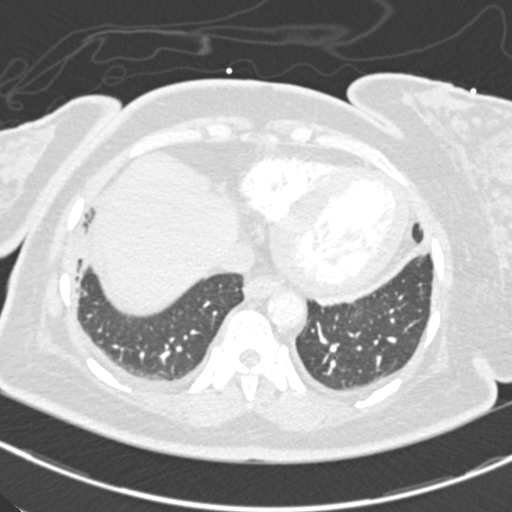
[im 90/259  soft-tissue]
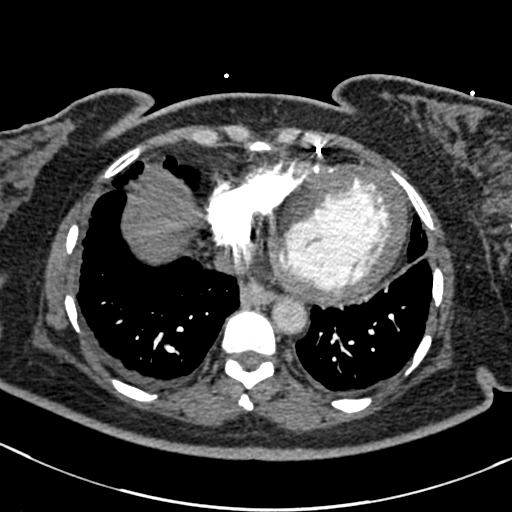
[im 101/259  lung]
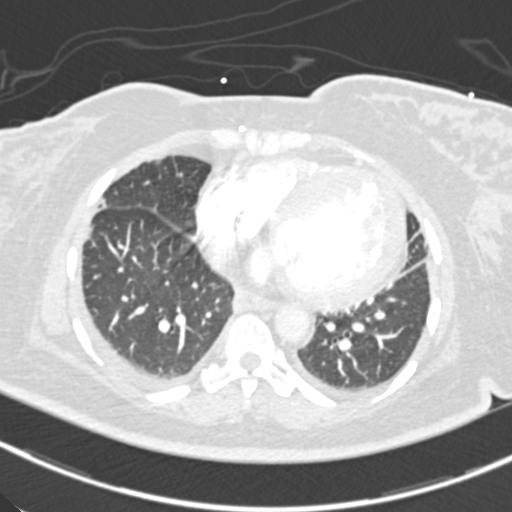
[im 124/259  soft-tissue]
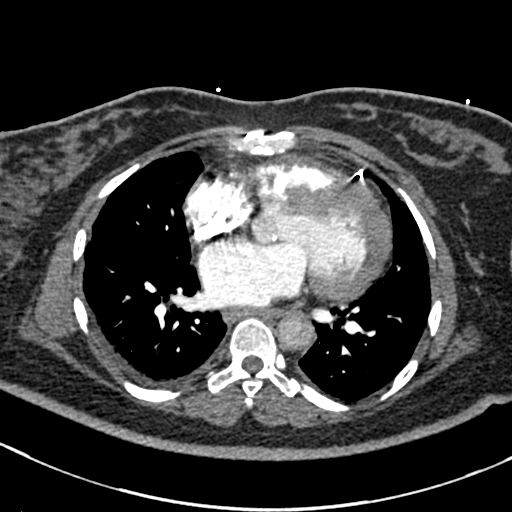
[im 135/259  lung]
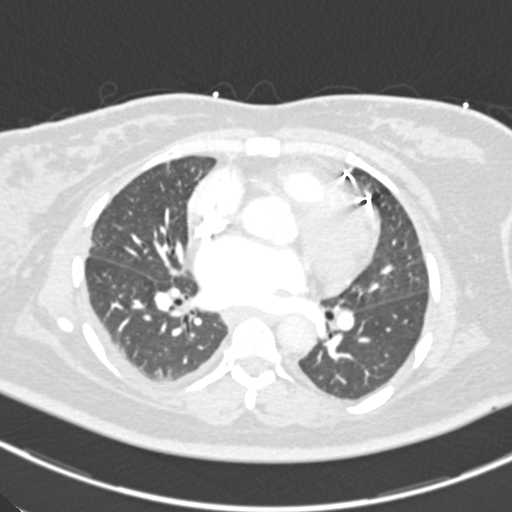
[im 158/259  soft-tissue]
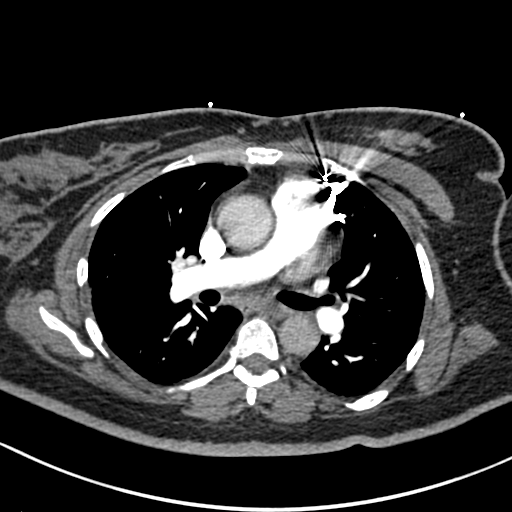
[im 169/259  lung]
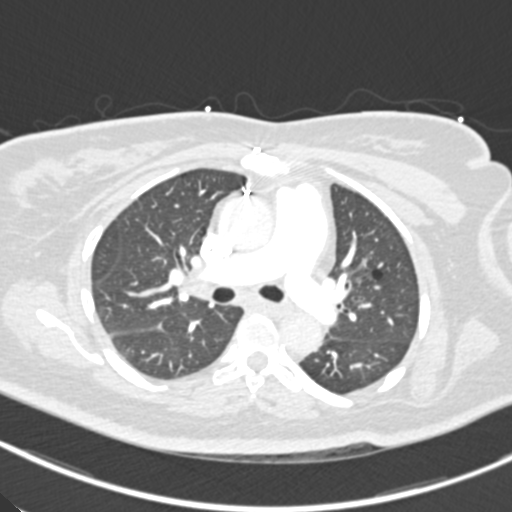
[im 180/259  soft-tissue]
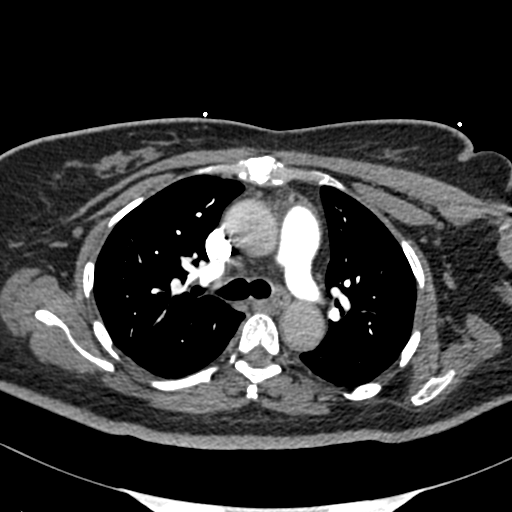
[im 202/259  lung]
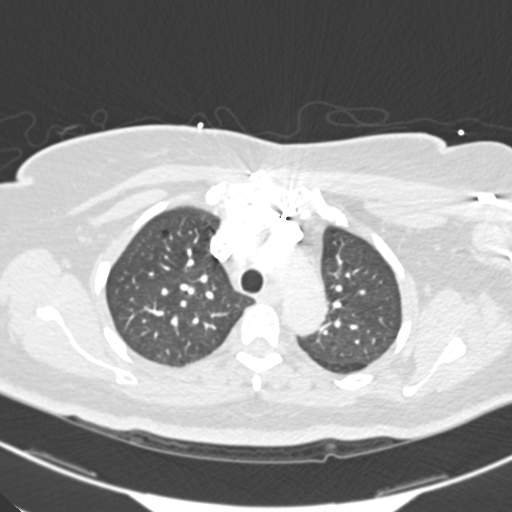
[im 214/259  soft-tissue]
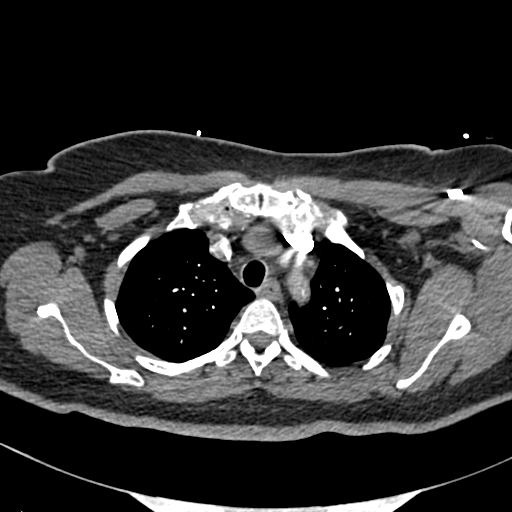
[im 225/259  lung]
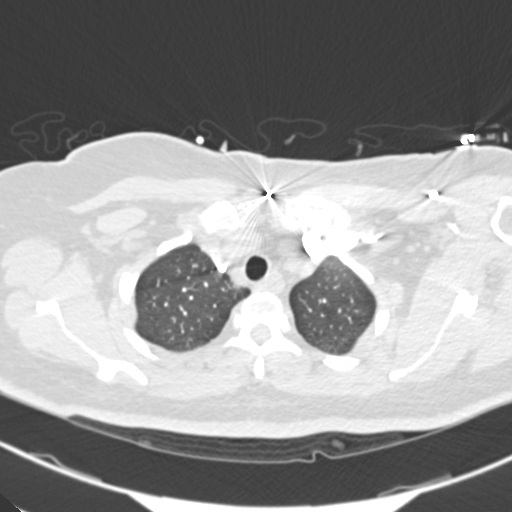
[im 247/259  soft-tissue]
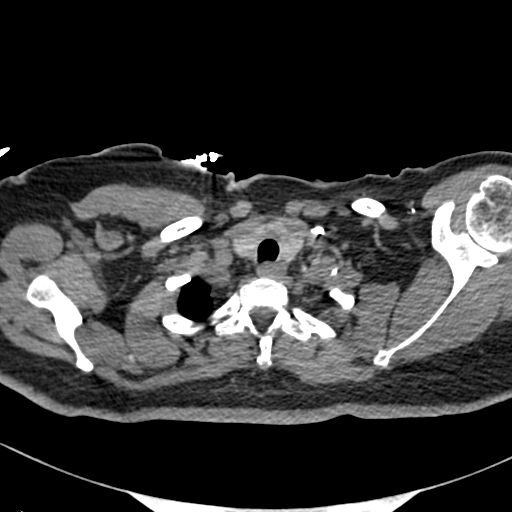

[Series 7: coronal mpr · coronal · 0.51mm/px · 2 of 74 slices shown]
[im 25/74  soft-tissue]
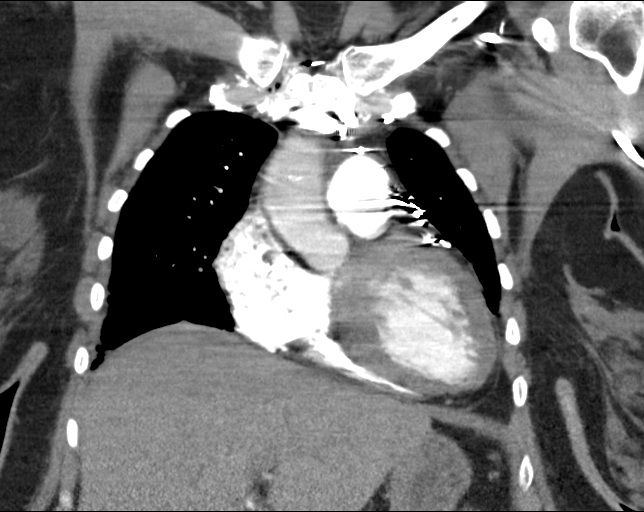
[im 49/74  soft-tissue]
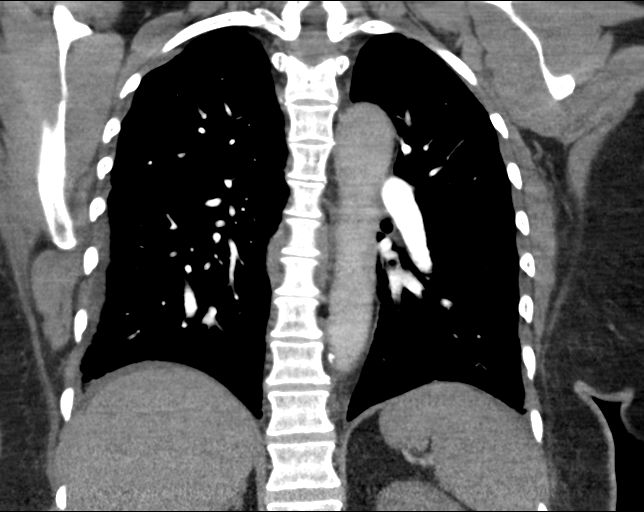

[18 of 46 positions shown; findings below may reference images not displayed]

FINDINGS: Cardiovascular: Satisfactory opacification of the pulmonary arteries
to the segmental level. No evidence of pulmonary embolism. Mild
aortic atherosclerosis. No aneurysmal dilatation. Coronary vascular
calcification. Cardiomegaly. No pericardial effusion.

Mediastinum/Nodes: Midline trachea. No thyroid mass. Mildly
prominent low right paratracheal node measuring 11 mm. Esophagus
within normal limits.

Lungs/Pleura: Mild right greater than left posterior pleural
thickening. 4 mm right upper lobe subpleural lung nodule, series 6,
image number 21. Mild subpleural fibrosis within the right middle
lobe and peripheral right base. Minimal emphysema with scattered
lung cysts.

Upper Abdomen: No acute abnormality.

Musculoskeletal: Post sternotomy changes. No acute or suspicious
osseous abnormality

Review of the MIP images confirms the above findings.
IMPRESSION: 1. Negative for acute pulmonary embolus.
2. No acute pulmonary infiltrate. Mild subpleural fibrosis in the
right middle lobe and right lung base.
3. Cardiomegaly

Aortic Atherosclerosis ([1E]-[1E]) and Emphysema ([1E]-[1E]).

## 2019-11-23 IMAGING — CT CT HEAD W/O CM
3 series · 15 of 46 positions shown, 18 images · non-contrast
Comparison: [DATE] CT

CLINICAL DATA: Chest pain and dizzy

EXAM:
CT HEAD WITHOUT CONTRAST
TECHNIQUE: Contiguous axial images were obtained from the base of the skull
through the vertex without intravenous contrast.

[Series 2: head wo · axial · 0.40mm/px · z∈[+1312,+1432]mm · 9 of 29 slices shown, 12 images]
[im 3/29  brain]
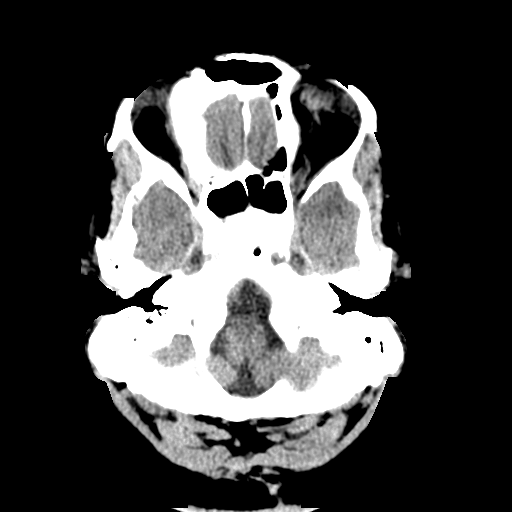
[im 3/29  bone]
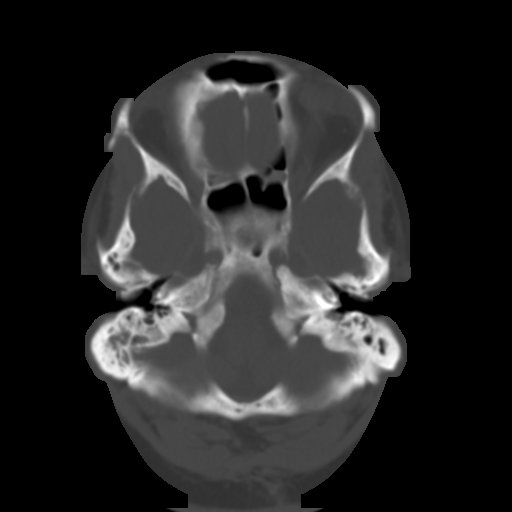
[im 6/29  brain]
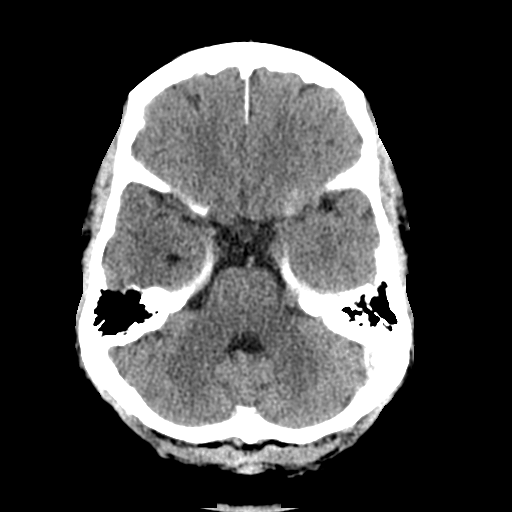
[im 9/29  brain]
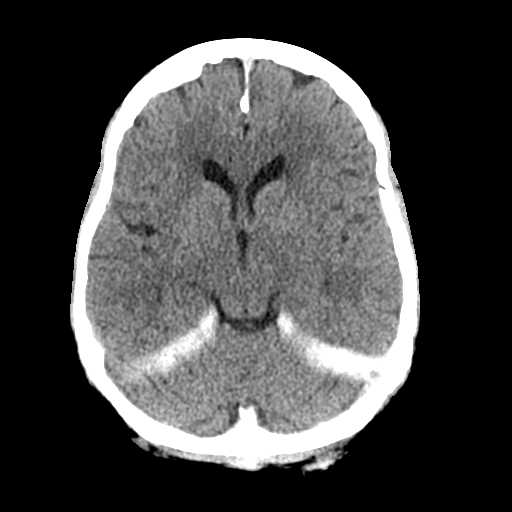
[im 12/29  brain]
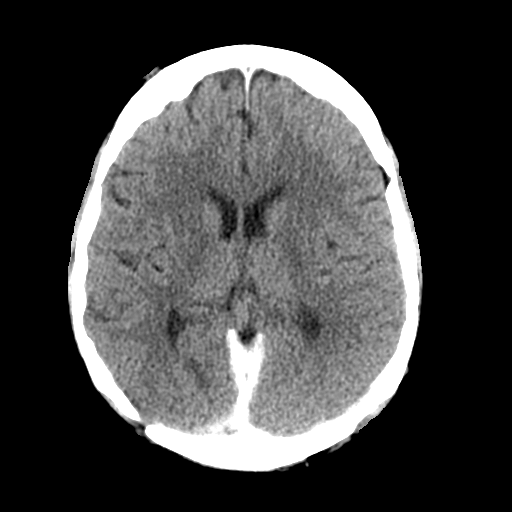
[im 15/29  brain]
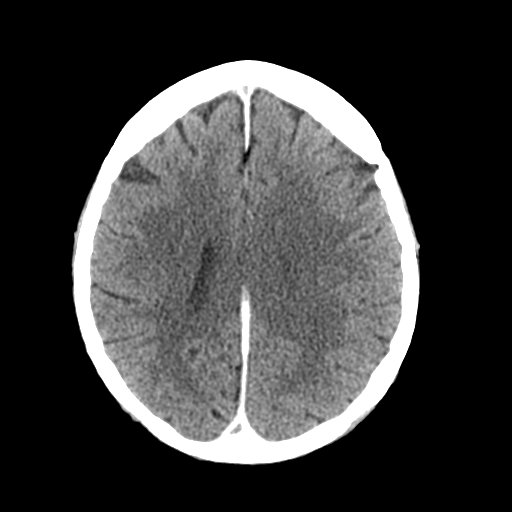
[im 15/29  bone]
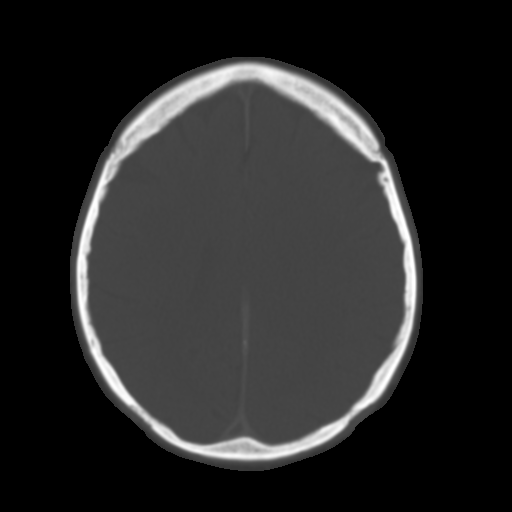
[im 18/29  brain]
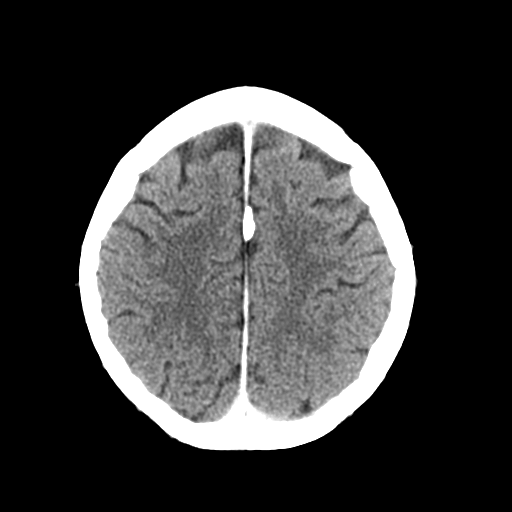
[im 21/29  brain]
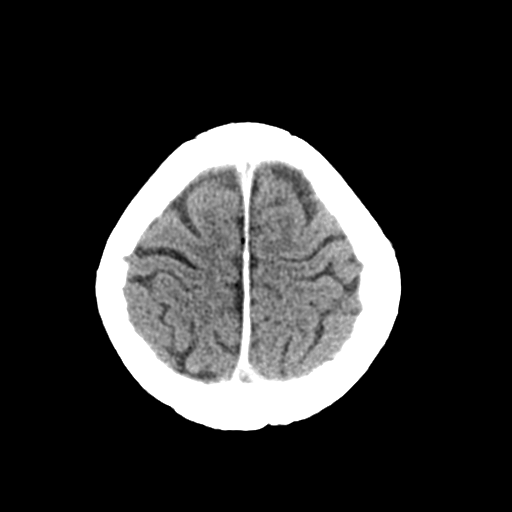
[im 24/29  brain]
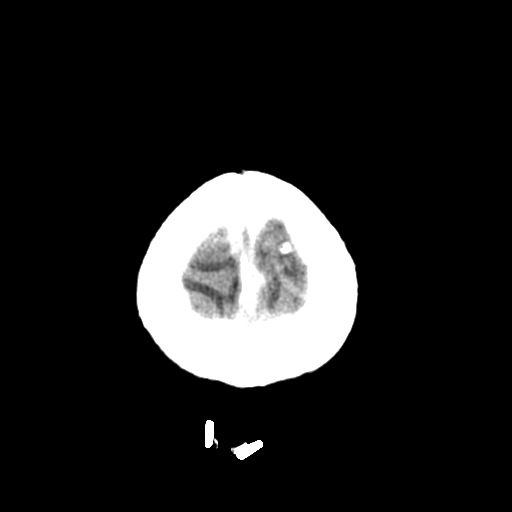
[im 27/29  brain]
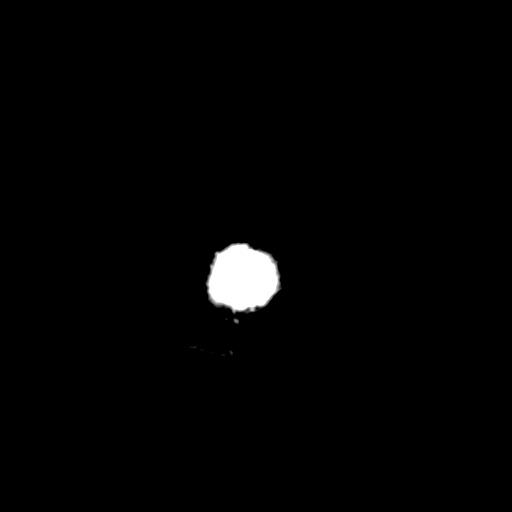
[im 27/29  bone]
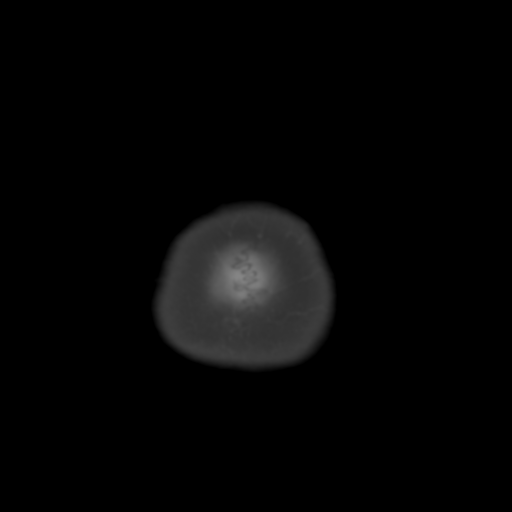

[Series 4: coronal soft tissue · coronal · 0.30mm/px · 3 of 60 slices shown]
[im 20/60  brain]
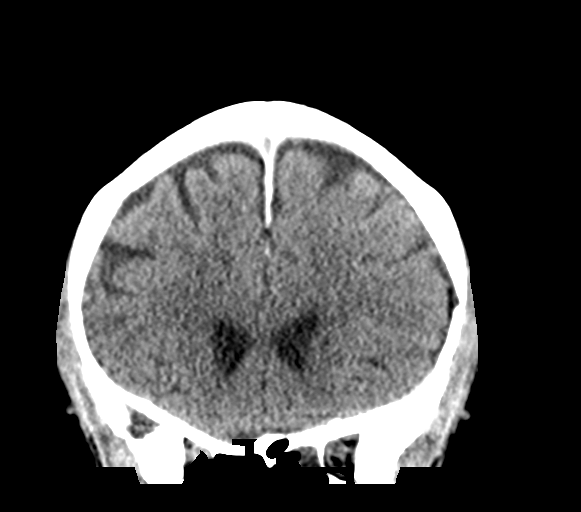
[im 27/60  brain]
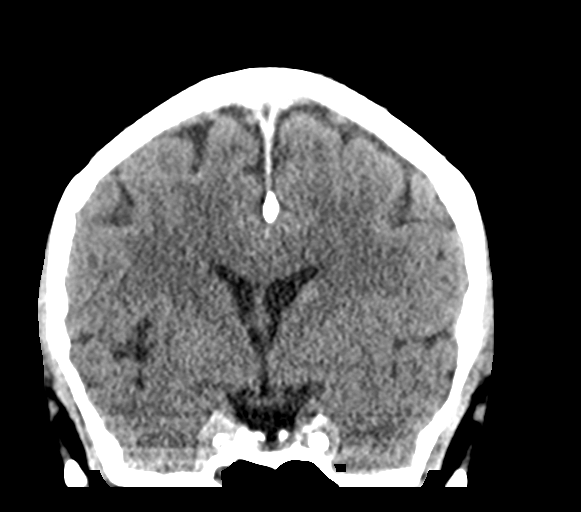
[im 33/60  brain]
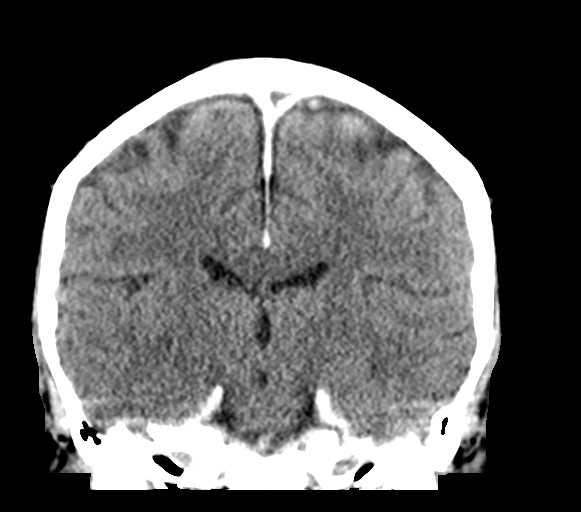

[Series 5: sagittal soft tissue · sagittal · 0.29mm/px · 3 of 52 slices shown]
[im 18/52  brain]
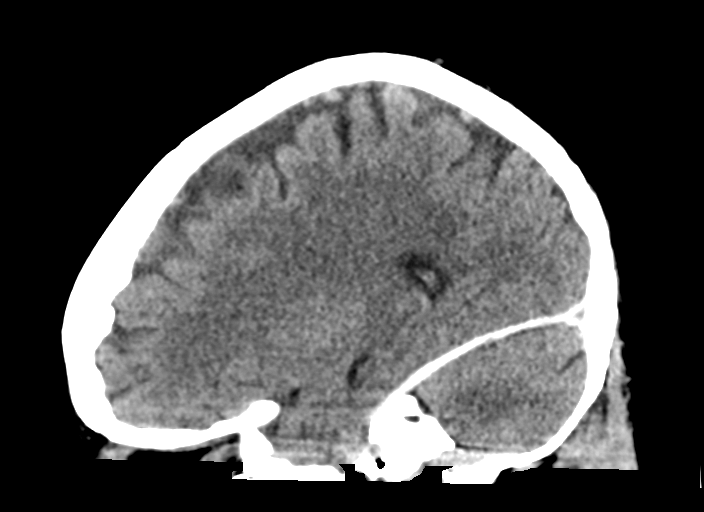
[im 26/52  brain]
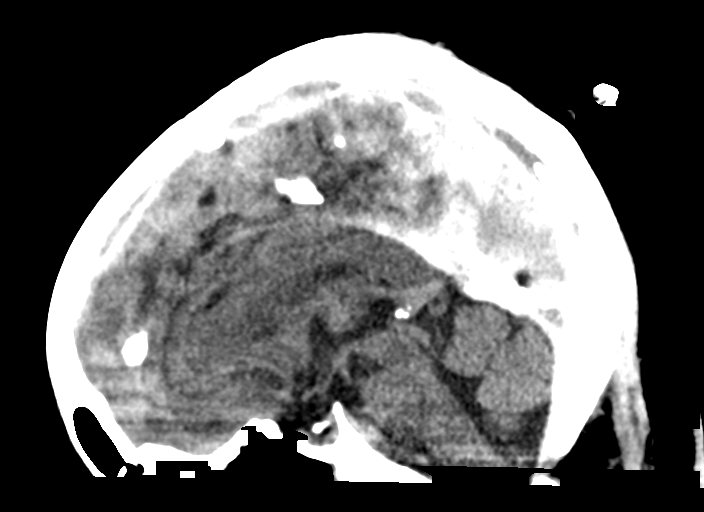
[im 35/52  brain]
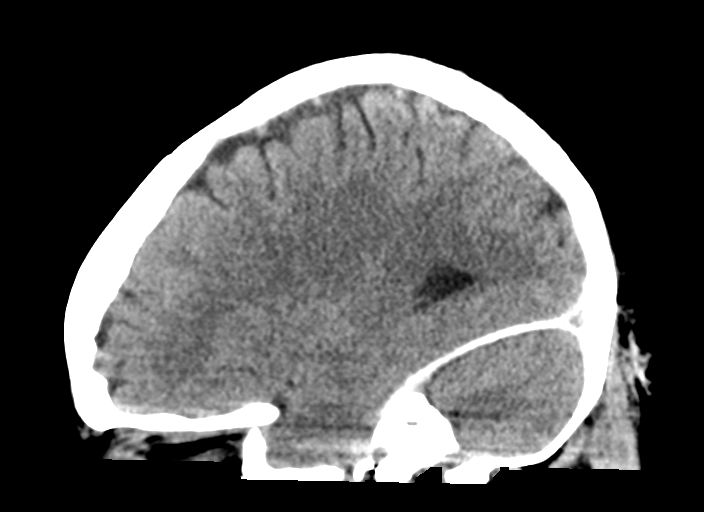

[15 of 46 positions shown; findings below may reference images not displayed]

FINDINGS: Brain: No acute territorial infarction or intracranial mass is
visualized. Hyperdense dural venous sinuses, similar as compared
with [WU]. Hypodense filling defect in the superior sagittal sinus
extending to the torcula and transverse sinuses, similar
distribution as compared with prior CT. Stable ventricle size.
Postsurgical changes in the sellar region.

Vascular: Carotid vascular calcification.

Skull: Right posterior craniotomy changes.  No fracture

Sinuses/Orbits: Postsurgical changes of the sinuses. Mild mucosal
thickening is evident

Other: None
IMPRESSION: 1. No definite acute interval change in appearance of the brain
since comparison study from [DATE].
2. Generalized hyperdensity in the region of the dural venous
sinuses with filling defect in the superior sagittal sinus,
extending to the torcula and the transverse sinuses, possibly due to
chronic thrombosis. These findings were all present on the [WU]
study and do not appear significantly changed.
3. Postoperative changes of the sellar region.

## 2019-11-23 IMAGING — CR DG CHEST 2V
1 series · 2 of 2 positions shown · non-contrast
Comparison: None.

CLINICAL DATA: Chest pain, dizziness

EXAM:
CHEST - 2 VIEW

[Series 1: dg chest 2 view · 0.14mm/px · 2 of 2 slices shown]
[im 1/2]
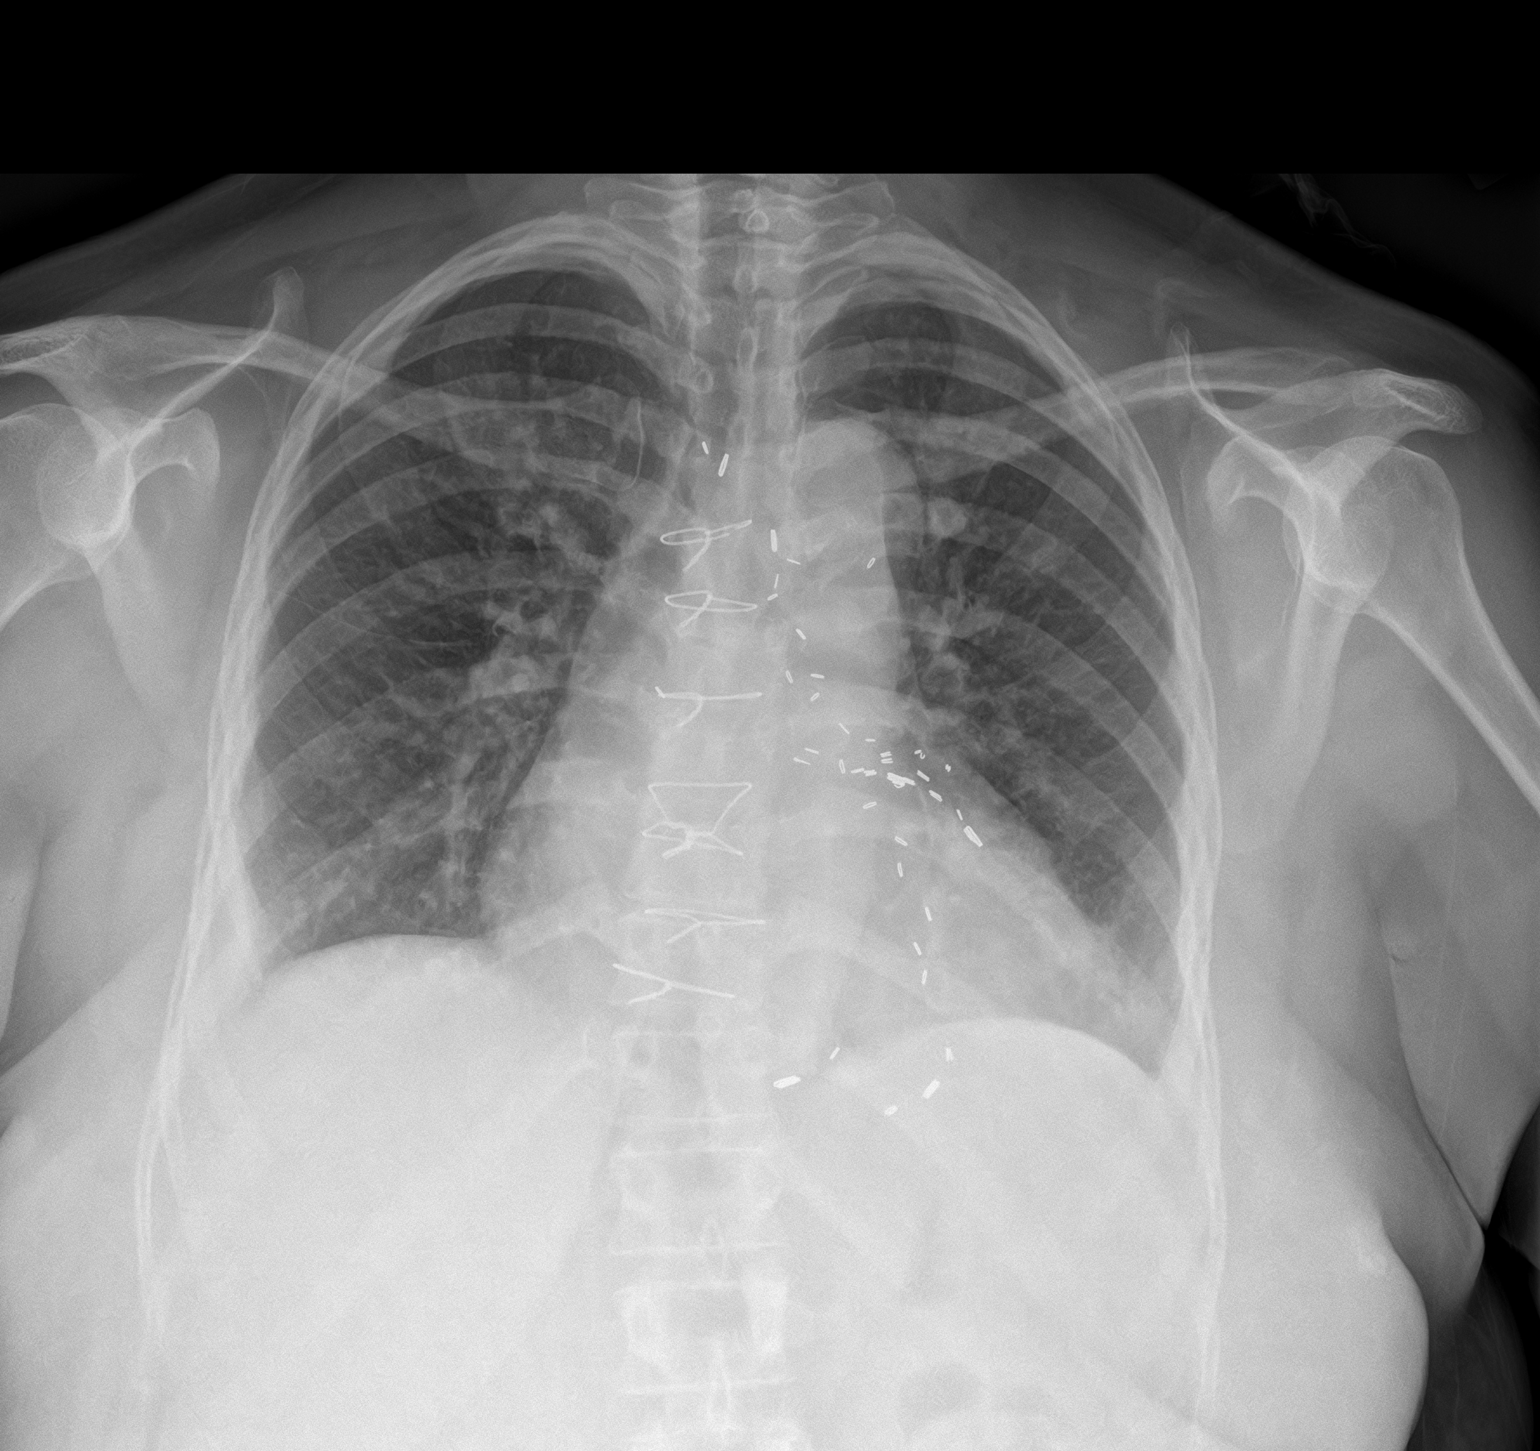
[im 2/2]
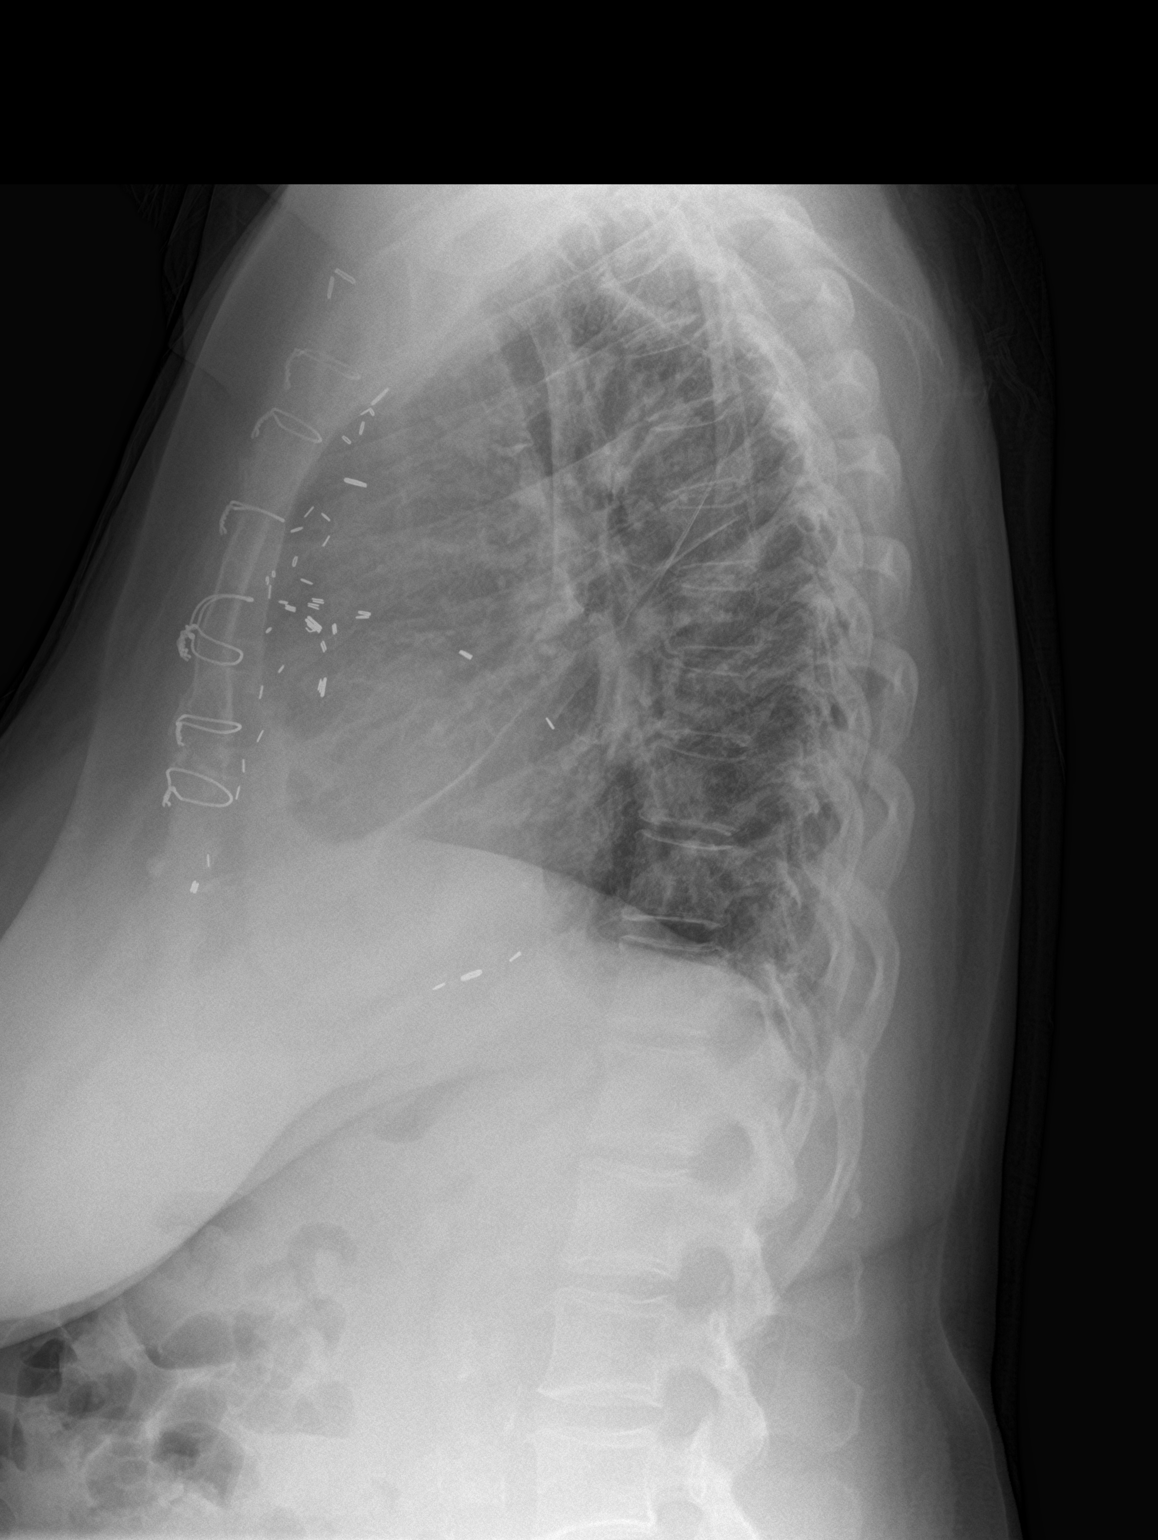

[2 of 2 positions shown; findings below may reference images not displayed]

FINDINGS: Post CABG changes. Mild cardiomegaly. Rounded densities in the left
hilar region likely calcified hilar lymph nodes. Trace right pleural
effusion. Mild streaky left basilar opacity. No pneumothorax.
Osseous structures are within normal limits.
IMPRESSION: 1. Trace right pleural effusion.
2. Mild streaky left basilar opacity, atelectasis versus infiltrate.
3. Mild cardiomegaly without edema.

## 2019-11-23 MED ORDER — MORPHINE SULFATE (PF) 2 MG/ML IV SOLN: 2.0000 mg | INTRAVENOUS | Status: DC | PRN

## 2019-11-23 MED ORDER — ASPIRIN EC 81 MG PO TBEC
81.0000 mg | DELAYED_RELEASE_TABLET | Freq: Every day | ORAL | Status: DC
  Administered 2019-11-24 – 2019-11-26 (×3): 81 mg via ORAL
  Filled 2019-11-23 (×4): qty 1

## 2019-11-23 MED ORDER — NITROGLYCERIN 0.4 MG SL SUBL
0.4000 mg | SUBLINGUAL_TABLET | SUBLINGUAL | Status: DC | PRN
  Administered 2019-11-23: 0.4 mg via SUBLINGUAL
  Filled 2019-11-23: qty 1

## 2019-11-23 MED ORDER — METOPROLOL TARTRATE 25 MG PO TABS
25.0000 mg | ORAL_TABLET | Freq: Two times a day (BID) | ORAL | Status: DC
  Administered 2019-11-23 – 2019-11-26 (×6): 25 mg via ORAL
  Filled 2019-11-23 (×6): qty 1

## 2019-11-23 MED ORDER — ALUM & MAG HYDROXIDE-SIMETH 200-200-20 MG/5ML PO SUSP
30.0000 mL | Freq: Once | ORAL | Status: AC
  Administered 2019-11-23: 30 mL via ORAL
  Filled 2019-11-23: qty 30

## 2019-11-23 MED ORDER — SODIUM CHLORIDE 0.9% FLUSH
3.0000 mL | Freq: Once | INTRAVENOUS | Status: AC
  Administered 2019-11-23: 3 mL via INTRAVENOUS

## 2019-11-23 MED ORDER — IOHEXOL 350 MG/ML SOLN: 75.0000 mL | Freq: Once | INTRAVENOUS | Status: DC | PRN

## 2019-11-23 MED ORDER — LISINOPRIL 5 MG PO TABS
30.0000 mg | ORAL_TABLET | Freq: Every day | ORAL | Status: DC
  Administered 2019-11-23 – 2019-11-24 (×2): 30 mg via ORAL
  Filled 2019-11-23 (×2): qty 1
  Filled 2019-11-23: qty 6

## 2019-11-23 MED ORDER — INSULIN ASPART PROT & ASPART (70-30 MIX) 100 UNIT/ML ~~LOC~~ SUSP
30.0000 [IU] | Freq: Two times a day (BID) | SUBCUTANEOUS | Status: DC
  Administered 2019-11-24: 30 [IU] via SUBCUTANEOUS
  Filled 2019-11-23: qty 10

## 2019-11-23 MED ORDER — MIRTAZAPINE 15 MG PO TABS
30.0000 mg | ORAL_TABLET | Freq: Every day | ORAL | Status: DC
  Administered 2019-11-23 – 2019-11-25 (×3): 30 mg via ORAL
  Filled 2019-11-23 (×3): qty 2

## 2019-11-23 MED ORDER — AMLODIPINE BESYLATE 5 MG PO TABS
5.0000 mg | ORAL_TABLET | Freq: Every day | ORAL | Status: DC
  Administered 2019-11-23 – 2019-11-24 (×2): 5 mg via ORAL
  Filled 2019-11-23 (×3): qty 1

## 2019-11-23 MED ORDER — MOMETASONE FURO-FORMOTEROL FUM 200-5 MCG/ACT IN AERO
2.0000 | INHALATION_SPRAY | Freq: Two times a day (BID) | RESPIRATORY_TRACT | Status: DC
  Administered 2019-11-23 – 2019-11-26 (×6): 2 via RESPIRATORY_TRACT
  Filled 2019-11-23 (×2): qty 8.8

## 2019-11-23 MED ORDER — TOPIRAMATE 25 MG PO TABS
25.0000 mg | ORAL_TABLET | Freq: Every day | ORAL | Status: DC
  Administered 2019-11-24 – 2019-11-26 (×3): 25 mg via ORAL
  Filled 2019-11-23 (×3): qty 1

## 2019-11-23 MED ORDER — LIDOCAINE VISCOUS HCL 2 % MT SOLN
15.0000 mL | Freq: Once | OROMUCOSAL | Status: AC
  Administered 2019-11-23: 15 mL via ORAL
  Filled 2019-11-23: qty 15

## 2019-11-23 MED ORDER — IOHEXOL 350 MG/ML SOLN
75.0000 mL | Freq: Once | INTRAVENOUS | Status: AC | PRN
  Administered 2019-11-23: 75 mL via INTRAVENOUS

## 2019-11-23 MED ORDER — ACETAMINOPHEN 500 MG PO TABS
1000.0000 mg | ORAL_TABLET | ORAL | Status: AC
  Filled 2019-11-23: qty 2

## 2019-11-23 MED ORDER — HEPARIN SODIUM (PORCINE) 5000 UNIT/ML IJ SOLN
5000.0000 [IU] | Freq: Three times a day (TID) | INTRAMUSCULAR | Status: DC
  Administered 2019-11-23 – 2019-11-26 (×8): 5000 [IU] via SUBCUTANEOUS
  Filled 2019-11-23 (×8): qty 1

## 2019-11-23 MED ORDER — ISOSORBIDE MONONITRATE ER 30 MG PO TB24
30.0000 mg | ORAL_TABLET | Freq: Every day | ORAL | Status: DC
  Administered 2019-11-23 – 2019-11-26 (×4): 30 mg via ORAL
  Filled 2019-11-23 (×4): qty 1

## 2019-11-23 MED ORDER — INSULIN ASPART 100 UNIT/ML ~~LOC~~ SOLN
0.0000 [IU] | Freq: Every day | SUBCUTANEOUS | Status: DC
  Administered 2019-11-23: 5 [IU] via SUBCUTANEOUS
  Filled 2019-11-23: qty 1

## 2019-11-23 MED ORDER — CHLORHEXIDINE GLUCONATE CLOTH 2 % EX PADS
6.0000 | MEDICATED_PAD | Freq: Every day | CUTANEOUS | Status: DC
  Administered 2019-11-24 – 2019-11-26 (×2): 6 via TOPICAL

## 2019-11-23 MED ORDER — NITROGLYCERIN 2 % TD OINT
1.0000 [in_us] | TOPICAL_OINTMENT | TRANSDERMAL | Status: AC
  Administered 2019-11-23: 1 [in_us] via TOPICAL
  Filled 2019-11-23: qty 1

## 2019-11-23 MED ORDER — FENOFIBRATE 160 MG PO TABS
160.0000 mg | ORAL_TABLET | Freq: Every day | ORAL | Status: DC
  Administered 2019-11-24 – 2019-11-26 (×3): 160 mg via ORAL
  Filled 2019-11-23 (×4): qty 1

## 2019-11-23 MED ORDER — MUPIROCIN 2 % EX OINT
1.0000 "application " | TOPICAL_OINTMENT | Freq: Two times a day (BID) | CUTANEOUS | Status: DC
  Administered 2019-11-24 – 2019-11-26 (×6): 1 via NASAL
  Filled 2019-11-23: qty 22

## 2019-11-23 MED ORDER — ATORVASTATIN CALCIUM 80 MG PO TABS
80.0000 mg | ORAL_TABLET | Freq: Every day | ORAL | Status: DC
  Administered 2019-11-25: 80 mg via ORAL
  Filled 2019-11-23: qty 1
  Filled 2019-11-23: qty 4

## 2019-11-23 MED ORDER — ONDANSETRON HCL 4 MG/2ML IJ SOLN
4.0000 mg | Freq: Four times a day (QID) | INTRAMUSCULAR | Status: DC | PRN
  Administered 2019-11-23: 4 mg via INTRAVENOUS
  Filled 2019-11-23: qty 2

## 2019-11-23 MED ORDER — ACETAMINOPHEN 325 MG PO TABS
650.0000 mg | ORAL_TABLET | ORAL | Status: DC | PRN
  Administered 2019-11-23 – 2019-11-25 (×5): 650 mg via ORAL
  Filled 2019-11-23 (×5): qty 2

## 2019-11-23 MED ORDER — HYDRALAZINE HCL 50 MG PO TABS
50.0000 mg | ORAL_TABLET | Freq: Three times a day (TID) | ORAL | Status: DC
  Administered 2019-11-23 – 2019-11-26 (×8): 50 mg via ORAL
  Filled 2019-11-23 (×8): qty 1

## 2019-11-23 MED ORDER — INSULIN ASPART 100 UNIT/ML ~~LOC~~ SOLN
0.0000 [IU] | Freq: Three times a day (TID) | SUBCUTANEOUS | Status: DC
  Administered 2019-11-24: 5 [IU] via SUBCUTANEOUS
  Administered 2019-11-24: 15 [IU] via SUBCUTANEOUS
  Administered 2019-11-24: 8 [IU] via SUBCUTANEOUS
  Administered 2019-11-25: 3 [IU] via SUBCUTANEOUS
  Administered 2019-11-25: 11 [IU] via SUBCUTANEOUS
  Administered 2019-11-25 – 2019-11-26 (×2): 3 [IU] via SUBCUTANEOUS
  Filled 2019-11-23 (×6): qty 1

## 2019-11-23 MED ORDER — INSULIN ASPART 100 UNIT/ML ~~LOC~~ SOLN
8.0000 [IU] | Freq: Once | SUBCUTANEOUS | Status: AC
  Administered 2019-11-23: 8 [IU] via SUBCUTANEOUS
  Filled 2019-11-23: qty 1

## 2019-11-23 MED ORDER — TORSEMIDE 20 MG PO TABS
10.0000 mg | ORAL_TABLET | Freq: Every day | ORAL | Status: DC
  Administered 2019-11-24: 10 mg via ORAL
  Filled 2019-11-23: qty 1

## 2019-11-23 NOTE — ED Provider Notes (Signed)
[facility] Emergency Department Provider Note   ____________________________________________   First MD Initiated Contact with Patient 11/23/19 1555     (approximate)  I have reviewed the triage vital signs and the nursing notes.   HISTORY  Chief Complaint Chest Pain and Dizziness    HPI Terry Arroyo is a 58 y.o. female here for evaluation of chest pain  Patient reports started noticing chest pain little bit last night and then throughout the day of burning feeling a heavy somewhat uncomfortable sensation over the left chest  It is mild to moderate intensity.  She has for the last week or 2 experience a little bit of body aches and a slight dry cough as well.  She reports she had a fever this morning and of about 102  No nausea vomiting.  No known exposure to Covid.  Has not been tested for COVID-19  Known cardiac history.  Previous MI.   Past Medical History:  Diagnosis Date   Blind left eye    Brain tumor ([place]) 1986   Diabetes mellitus without complication ([place])    Hyperlipidemia    Hypertension    MI (myocardial infarction) ([address]) 2018    Patient Active Problem List   Diagnosis Date Noted   Chest pain 11/23/2019    Past Surgical History:  Procedure Laterality Date   BRAIN SURGERY     CESAREAN SECTION      Prior to Admission medications   Not on File    Allergies Shellfish allergy, Kiwi extract, and Metformin  No family history on file.  Social History Social History   Tobacco Use   Smoking status: Former Smoker   Smokeless tobacco: Never Used  Substance Use Topics   Alcohol use: Not Currently   Drug use: Not Currently    Review of Systems Constitutional: see HPI Eyes: No visual changes. ENT: No sore throat. Cardiovascular: See HPI Respiratory: Denies shortness of breath. Gastrointestinal: No abdominal pain.   Genitourinary: Negative for dysuria. Musculoskeletal: Negative for back pain.  Some  muscle aches the last week or 2 Skin: Negative for rash. Neurological: Negative for headaches, areas of focal weakness or numbness.    ____________________________________________   PHYSICAL EXAM:  VITAL SIGNS: ED Triage Vitals  Enc Vitals Group     BP 11/23/19 1225 (!) 193/93     Pulse Rate 11/23/19 1225 67     Resp 11/23/19 1225 20     Temp 11/23/19 1225 98.9 F (37.2 C)     Temp Source 11/23/19 1225 Oral     SpO2 11/23/19 1225 100 %     Weight 11/23/19 1225 179 lb (81.2 kg)     Height 11/23/19 1225 5\' 5"  (1.651 m)     Head Circumference --      Peak Flow --      Pain Score 11/23/19 1229 5     Pain Loc --      Pain Edu? --      Excl. in [place]? --     Constitutional: Alert and oriented. Well appearing and in no acute distress.  Pleasant.  Significant hypertension noted on recheck of blood pressure Eyes: Conjunctivae are normal. Head: Atraumatic. Nose: No congestion/rhinnorhea. Mouth/Throat: Mucous membranes are moist. Neck: No stridor.  Cardiovascular: Normal rate, regular rhythm. Grossly normal heart sounds.  Good peripheral circulation. Respiratory: Normal respiratory effort.  No retractions. Lungs CTAB.  Noted to be actively coughing.  Lung sounds clear bilateral no wheezing. Gastrointestinal: Soft and nontender.  No distention. Musculoskeletal: No lower extremity tenderness nor edema. Neurologic:  Normal speech and language. No gross focal neurologic deficits are appreciated.  Skin:  Skin is warm, dry and intact. No rash noted. Psychiatric: Mood and affect are normal. Speech and behavior are normal.  ____________________________________________   LABS (all labs ordered are listed, but only abnormal results are displayed)  Labs Reviewed  BASIC METABOLIC PANEL - Abnormal; Notable for the following components:      Result Value   CO2 21 (*)    Glucose, Bld 462 (*)    Creatinine, Ser 1.28 (*)    Calcium 8.2 (*)    GFR calc non Af Amer 46 (*)    GFR calc Af Amer  54 (*)    All other components within normal limits  TROPONIN I (HIGH SENSITIVITY) - Abnormal; Notable for the following components:   Troponin I (High Sensitivity) 36 (*)    All other components within normal limits  TROPONIN I (HIGH SENSITIVITY) - Abnormal; Notable for the following components:   Troponin I (High Sensitivity) 35 (*)    All other components within normal limits  TROPONIN I (HIGH SENSITIVITY) - Abnormal; Notable for the following components:   Troponin I (High Sensitivity) 38 (*)    All other components within normal limits  SARS CORONAVIRUS 2 BY RT PCR (HOSPITAL ORDER, [facility])  CBC  URINE DRUG SCREEN, QUALITATIVE ([facility] ONLY)  HEMOGLOBIN A1C  HIV ANTIBODY (ROUTINE TESTING W REFLEX)  LIPID PANEL   ____________________________________________  EKG  Reviewed enterotomy at 1230 Heart rate 70 QRS 100 QTc 500 Normal sinus rhythm, probable left ventricular hypertrophy, repolarization abnormality cannot exclude ischemia  No previous for comparison ____________________________________________  RADIOLOGY  Dg Chest 2 View  Result Date: 11/23/2019 CLINICAL DATA:  Chest pain, dizziness EXAM: CHEST - 2 VIEW COMPARISON:  None. FINDINGS: Post CABG changes. Mild cardiomegaly. Rounded densities in the left hilar region likely calcified hilar lymph nodes. Trace right pleural effusion. Mild streaky left basilar opacity. No pneumothorax. Osseous structures are within normal limits. IMPRESSION: 1. Trace right pleural effusion. 2. Mild streaky left basilar opacity, atelectasis versus infiltrate. 3. Mild cardiomegaly without edema. Electronically Signed   By: Davina Poke M.D.   On: 11/23/2019 13:31   Ct Head Wo Contrast  Result Date: 11/23/2019 CLINICAL DATA:  Chest pain and dizzy EXAM: CT HEAD WITHOUT CONTRAST TECHNIQUE: Contiguous axial images were obtained from the base of the skull through the vertex without intravenous contrast. COMPARISON:   11/18/2013 CT FINDINGS: Brain: No acute territorial infarction or intracranial mass is visualized. Hyperdense dural venous sinuses, similar as compared with 2014. Hypodense filling defect in the superior sagittal sinus extending to the torcula and transverse sinuses, similar distribution as compared with prior CT. Stable ventricle size. Postsurgical changes in the sellar region. Vascular: Carotid vascular calcification. Skull: Right posterior craniotomy changes.  No fracture Sinuses/Orbits: Postsurgical changes of the sinuses. Mild mucosal thickening is evident Other: None IMPRESSION: 1. No definite acute interval change in appearance of the brain since comparison study from November 2014. 2. Generalized hyperdensity in the region of the dural venous sinuses with filling defect in the superior sagittal sinus, extending to the torcula and the transverse sinuses, possibly due to chronic thrombosis. These findings were all present on the 2014 study and do not appear significantly changed. 3. Postoperative changes of the sellar region. Electronically Signed   By: Donavan Foil M.D.   On: 11/23/2019 18:12   Ct Angio  Chest Pe W And/or Wo Contrast  Result Date: 11/23/2019 CLINICAL DATA:  Pleuritic chest pain EXAM: CT ANGIOGRAPHY CHEST WITH CONTRAST TECHNIQUE: Multidetector CT imaging of the chest was performed using the standard protocol during bolus administration of intravenous contrast. Multiplanar CT image reconstructions and MIPs were obtained to evaluate the vascular anatomy. CONTRAST:  11mL OMNIPAQUE IOHEXOL 350 MG/ML SOLN COMPARISON:  Chest x-ray 11/23/2019 FINDINGS: Cardiovascular: Satisfactory opacification of the pulmonary arteries to the segmental level. No evidence of pulmonary embolism. Mild aortic atherosclerosis. No aneurysmal dilatation. Coronary vascular calcification. Cardiomegaly. No pericardial effusion. Mediastinum/Nodes: Midline trachea. No thyroid mass. Mildly prominent low right paratracheal  node measuring 11 mm. Esophagus within normal limits. Lungs/Pleura: Mild right greater than left posterior pleural thickening. 4 mm right upper lobe subpleural lung nodule, series 6, image number 21. Mild subpleural fibrosis within the right middle lobe and peripheral right base. Minimal emphysema with scattered lung cysts. Upper Abdomen: No acute abnormality. Musculoskeletal: Post sternotomy changes. No acute or suspicious osseous abnormality Review of the MIP images confirms the above findings. IMPRESSION: 1. Negative for acute pulmonary embolus. 2. No acute pulmonary infiltrate. Mild subpleural fibrosis in the right middle lobe and right lung base. 3. Cardiomegaly Aortic Atherosclerosis (ICD10-I70.0) and Emphysema (ICD10-J43.9). Electronically Signed   By: Donavan Foil M.D.   On: 11/23/2019 18:00     ____________________________________________   PROCEDURES  Procedure(s) performed: None  Procedures  Critical Care performed: No  ____________________________________________   INITIAL IMPRESSION / ASSESSMENT AND PLAN / ED COURSE  Pertinent labs & imaging results that were available during my care of the patient were reviewed by me and considered in my medical decision making (see chart for details).   Differential diagnosis includes, but is not limited to, ACS, aortic dissection, pulmonary embolism, cardiac tamponade, pneumothorax, pneumonia, pericarditis, myocarditis, GI-related causes including esophagitis/gastritis, and musculoskeletal chest wall pain, covid, pneumonia, etc.        ____________________________________________   FINAL CLINICAL IMPRESSION(S) / ED DIAGNOSES  Final diagnoses:  Chest pain, moderate coronary artery risk        Note:  This document was prepared using Dragon voice recognition software and may include unintentional dictation errors       Delman Kitten, MD 11/23/19 1924

## 2019-11-23 NOTE — H&P (Signed)
[facility] History and Physical   Patient: Terry Arroyo IHK:[id number]   PCP: Kirk Ruths, MD DOB: October 21, 1961   DOA: 11/23/2019   DOS: 11/23/2019   DOS: the patient was seen and examined on 11/23/2019  Patient coming from: The patient is coming from Home  Chief Complaint: Chest tightness as well as dizziness and cough and shortness of breath  HPI: Terry Arroyo is a 58 y.o. female with Past medical history of CAD SP CABG in 2018, HTN, HLD, type II DM with left eye vision loss likely from retinopathy, Covid infection in March 2020 in [place]. Patient presented with complaints of chest pain. She mentions that 1 month ago her PCP took her off of the Norvasc that she was taking for her blood pressure. 3 weeks ago she started having complaints of on and off chest pain associated with shortness of breath. For last 3 to 4 days patient actually has been complaining of orthopnea as well as PND. She has been increasing the number of the pillows that she is using. She has chronic swelling and denies any change in her swelling. She had a temperature with EMS but did not feel feverish at home. She reports having chills for last 1 week. No diarrhea no constipation. No recent Covid exposure. She remains compliant with her medication and does not drink or smoke alcohol or use recreational drugs.  ED Course: Blood pressure significantly elevated.  Troponins were elevated as well.  Patient was referred for admission.  At her baseline ambulates without assistance independent for most of her ADL;  manages her medication on her own.  Review of Systems: as mentioned in the history of present illness.  All other systems reviewed and are negative.  Past Medical History:  Diagnosis Date  . Blind left eye   . Brain tumor ([place]) 1986  . Diabetes mellitus without complication ([place])   . Hyperlipidemia   . Hypertension   . MI (myocardial infarction) ([place]) 2018   Past Surgical History:   Procedure Laterality Date  . BRAIN SURGERY    . CESAREAN SECTION     Social History:  reports that she has quit smoking. She has never used smokeless tobacco. She reports previous alcohol use. She reports previous drug use.  Allergies  Allergen Reactions  . Shellfish Allergy Anaphylaxis    Shrimp/lobster  Shrimp/lobster    . Kiwi Extract Swelling  . Metformin Other (See Comments)    Other reaction(s): GI Intolerance   Family history reviewed and not pertinent   Prior to Admission medications   Not on File    Physical Exam: Vitals:   11/23/19 1621 11/23/19 1641 11/23/19 1708 11/23/19 1820  BP: (!) 201/129 (!) 197/97 (!) 196/98 (!) 210/124  Pulse: 71 72 74 65  Resp: 18 19 18 14   Temp: 98.4 F (36.9 C)     TempSrc: Oral     SpO2: 99% 98% 98% 96%  Weight:      Height:        General: alert and oriented to time, place, and person. Appear in mild distress, affect appropriate Eyes: PERRL, Conjunctiva normal ENT: Oral Mucosa Clear, moist  Neck: no JVD, no Abnormal Mass Or lumps Cardiovascular: S1 and S2 Present, no Murmur, peripheral pulses symmetrical Respiratory: good respiratory effort, Bilateral Air entry equal and Decreased, no signs of accessory muscle use, faint basal crackles, no wheezes Abdomen: Bowel Sound present, Soft and no tenderness, no hernia Skin: no rashes  Extremities: bilateral  Pedal edema, no calf tenderness Neurologic: without any new focal findings, mental status, alert and oriented x3, speech normal, PERLA, Motor strength 5/5 and symmetric, Sensation grossly normal to light touch and Reflex difficult to assess Gait not checked due to patient safety concerns  Data Reviewed: I have personally reviewed and interpreted labs, imaging as discussed below.  CBC: Recent Labs  Lab 11/23/19 1234  WBC 7.9  HGB 12.3  HCT 36.4  MCV 86.5  PLT 650   Basic Metabolic Panel: Recent Labs  Lab 11/23/19 1234  NA 137  K 3.9  CL 104  CO2 21*  GLUCOSE  462*  BUN 12  CREATININE 1.28*  CALCIUM 8.2*   GFR: Estimated Creatinine Clearance: 51.1 mL/min (A) (by C-G formula based on SCr of 1.28 mg/dL (H)). Liver Function Tests: No results for input(s): AST, ALT, ALKPHOS, BILITOT, PROT, ALBUMIN in the last 168 hours. No results for input(s): LIPASE, AMYLASE in the last 168 hours. No results for input(s): AMMONIA in the last 168 hours. Coagulation Profile: No results for input(s): INR, PROTIME in the last 168 hours. Cardiac Enzymes: No results for input(s): CKTOTAL, CKMB, CKMBINDEX, TROPONINI in the last 168 hours. BNP (last 3 results) No results for input(s): PROBNP in the last 8760 hours. HbA1C: No results for input(s): HGBA1C in the last 72 hours. CBG: No results for input(s): GLUCAP in the last 168 hours. Lipid Profile: No results for input(s): CHOL, HDL, LDLCALC, TRIG, CHOLHDL, LDLDIRECT in the last 72 hours. Thyroid Function Tests: No results for input(s): TSH, T4TOTAL, FREET4, T3FREE, THYROIDAB in the last 72 hours. Anemia Panel: No results for input(s): VITAMINB12, FOLATE, FERRITIN, TIBC, IRON, RETICCTPCT in the last 72 hours. Urine analysis: No results found for: COLORURINE, APPEARANCEUR, LABSPEC, PHURINE, GLUCOSEU, HGBUR, BILIRUBINUR, KETONESUR, PROTEINUR, UROBILINOGEN, NITRITE, LEUKOCYTESUR  Radiological Exams on Admission: Dg Chest 2 View  Result Date: 11/23/2019 CLINICAL DATA:  Chest pain, dizziness EXAM: CHEST - 2 VIEW COMPARISON:  None. FINDINGS: Post CABG changes. Mild cardiomegaly. Rounded densities in the left hilar region likely calcified hilar lymph nodes. Trace right pleural effusion. Mild streaky left basilar opacity. No pneumothorax. Osseous structures are within normal limits. IMPRESSION: 1. Trace right pleural effusion. 2. Mild streaky left basilar opacity, atelectasis versus infiltrate. 3. Mild cardiomegaly without edema. Electronically Signed   By: Davina Poke M.D.   On: 11/23/2019 13:31   Ct Head Wo  Contrast  Result Date: 11/23/2019 CLINICAL DATA:  Chest pain and dizzy EXAM: CT HEAD WITHOUT CONTRAST TECHNIQUE: Contiguous axial images were obtained from the base of the skull through the vertex without intravenous contrast. COMPARISON:  11/18/2013 CT FINDINGS: Brain: No acute territorial infarction or intracranial mass is visualized. Hyperdense dural venous sinuses, similar as compared with 2014. Hypodense filling defect in the superior sagittal sinus extending to the torcula and transverse sinuses, similar distribution as compared with prior CT. Stable ventricle size. Postsurgical changes in the sellar region. Vascular: Carotid vascular calcification. Skull: Right posterior craniotomy changes.  No fracture Sinuses/Orbits: Postsurgical changes of the sinuses. Mild mucosal thickening is evident Other: None IMPRESSION: 1. No definite acute interval change in appearance of the brain since comparison study from November 2014. 2. Generalized hyperdensity in the region of the dural venous sinuses with filling defect in the superior sagittal sinus, extending to the torcula and the transverse sinuses, possibly due to chronic thrombosis. These findings were all present on the 2014 study and do not appear significantly changed. 3. Postoperative changes of the sellar region. Electronically Signed  By: Donavan Foil M.D.   On: 11/23/2019 18:12   Ct Angio Chest Pe W And/or Wo Contrast  Result Date: 11/23/2019 CLINICAL DATA:  Pleuritic chest pain EXAM: CT ANGIOGRAPHY CHEST WITH CONTRAST TECHNIQUE: Multidetector CT imaging of the chest was performed using the standard protocol during bolus administration of intravenous contrast. Multiplanar CT image reconstructions and MIPs were obtained to evaluate the vascular anatomy. CONTRAST:  85mL OMNIPAQUE IOHEXOL 350 MG/ML SOLN COMPARISON:  Chest x-ray 11/23/2019 FINDINGS: Cardiovascular: Satisfactory opacification of the pulmonary arteries to the segmental level. No evidence of  pulmonary embolism. Mild aortic atherosclerosis. No aneurysmal dilatation. Coronary vascular calcification. Cardiomegaly. No pericardial effusion. Mediastinum/Nodes: Midline trachea. No thyroid mass. Mildly prominent low right paratracheal node measuring 11 mm. Esophagus within normal limits. Lungs/Pleura: Mild right greater than left posterior pleural thickening. 4 mm right upper lobe subpleural lung nodule, series 6, image number 21. Mild subpleural fibrosis within the right middle lobe and peripheral right base. Minimal emphysema with scattered lung cysts. Upper Abdomen: No acute abnormality. Musculoskeletal: Post sternotomy changes. No acute or suspicious osseous abnormality Review of the MIP images confirms the above findings. IMPRESSION: 1. Negative for acute pulmonary embolus. 2. No acute pulmonary infiltrate. Mild subpleural fibrosis in the right middle lobe and right lung base. 3. Cardiomegaly Aortic Atherosclerosis (ICD10-I70.0) and Emphysema (ICD10-J43.9). Electronically Signed   By: Donavan Foil M.D.   On: 11/23/2019 18:00   EKG: Independently reviewed. normal sinus rhythm, nonspecific ST and T waves changes. Echocardiogram: Ordered on 11/23/2019  I reviewed all nursing notes, pharmacy notes, vitals, pertinent old records.  Assessment/Plan 1.  Chest pain  hypertension/hypertensive emergency Elevated troponin Presents with complaints of chest pain. Blood pressure significantly elevated. Likely this is all demand ischemia from high blood pressure. High blood pressure is likely from recent change in her medication. We will continue blood pressure medication. Admit in stepdown unit potential follow-up consider troponin production echocardiogram. If the echocardiogram is abnormal or the troponin is significantly abnormal patient may require to be placed on heparin drip and cardiology should be consulted. Continue close monitoring.  2.  Dizziness Likely secondary to high blood pressure.  CNS examination unremarkable. CT head unremarkable as well. Monitor.  3. Type 2 Diabetes Mellitus, moderately controled with ophthalmic complication Check hemoglobin A1c production currently on sliding scale moderate. Continuing home insulin as well.  4.  Hyperlipidemia. Continue home regimen. Nutrition: Cardiac diet and Carb modified diet DVT Prophylaxis: Subcutaneous Heparin   Advance goals of care discussion: Full code   Consults: none   Family Communication: no family was present at bedside, at the time of interview.  Disposition: Admitted as observation, step-down unit. Likely to be discharged home, in 1-2 days.  I have discussed plan of care as described above with RN and patient/family.   Author: Berle Mull, MD [facility] 11/23/2019 7:52 PM   To reach On-call, see care teams to locate the attending and reach out to them via [email or web address]. If 7PM-7AM, please contact night-coverage If you still have difficulty reaching the attending provider, please page the [facility] (Director on Call) for [facility] on amion for assistance.

## 2019-11-23 NOTE — ED Notes (Signed)
Pt returned from CT at this time, admitting MD at bedside at this time.

## 2019-11-23 NOTE — ED Notes (Signed)
Pt taken to CT at this time.

## 2019-11-23 NOTE — ED Notes (Signed)
First Nurse Note: Pt to ED via [facility] from home for Chest pain x 3 days, pain is radiating down left arm today. Pt febrile at 102 (temp). Pt has hx/o HTN, BP today 250/125- Pt states that she is on 3 Bp meds.

## 2019-11-23 NOTE — ED Triage Notes (Signed)
Pt to ED via [facility] from home for Chest pain x 3 days. Pt states that she called EMS today because this morning when she woke up she was dizzy. Pt states that the pain in chest comes and goes. Pt states that chest is currently hurting. Pt states that she was also having back pain and nausea. Pt was given 500 cc of fluid by EMS and 4 mg of Zofran. Pt states that her nausea is better now. EMS reported temp of 102, however, pt is afebrile in triage.

## 2019-11-23 NOTE — ED Notes (Signed)
CT notified that patient's IV termination point is above the wrist. CT states will come and evaluate.

## 2019-11-23 NOTE — ED Notes (Signed)
Patient ambulatory to bathroom, independent. Meal tray provided per request. Updated on POC, waiting for COVID results and IP bed.

## 2019-11-24 ENCOUNTER — Observation Stay
Admit: 2019-11-24 | Discharge: 2019-11-24 | Disposition: A | Discharge status: Home / Self Care | Payer: Medicare HMO | Attending: Internal Medicine | Admitting: Internal Medicine

## 2019-11-24 DIAGNOSIS — I160 Hypertensive urgency: Secondary | ICD-10-CM | POA: Diagnosis present

## 2019-11-24 DIAGNOSIS — G8929 Other chronic pain: Secondary | ICD-10-CM | POA: Diagnosis present

## 2019-11-24 DIAGNOSIS — Z888 Allergy status to other drugs, medicaments and biological substances status: Secondary | ICD-10-CM | POA: Diagnosis not present

## 2019-11-24 DIAGNOSIS — Z8619 Personal history of other infectious and parasitic diseases: Secondary | ICD-10-CM | POA: Diagnosis not present

## 2019-11-24 DIAGNOSIS — Z951 Presence of aortocoronary bypass graft: Secondary | ICD-10-CM | POA: Diagnosis not present

## 2019-11-24 DIAGNOSIS — I129 Hypertensive chronic kidney disease with stage 1 through stage 4 chronic kidney disease, or unspecified chronic kidney disease: Secondary | ICD-10-CM | POA: Diagnosis present

## 2019-11-24 DIAGNOSIS — I2510 Atherosclerotic heart disease of native coronary artery without angina pectoris: Secondary | ICD-10-CM | POA: Diagnosis present

## 2019-11-24 DIAGNOSIS — Z794 Long term (current) use of insulin: Secondary | ICD-10-CM | POA: Diagnosis not present

## 2019-11-24 DIAGNOSIS — I252 Old myocardial infarction: Secondary | ICD-10-CM | POA: Diagnosis not present

## 2019-11-24 DIAGNOSIS — Z87891 Personal history of nicotine dependence: Secondary | ICD-10-CM | POA: Diagnosis not present

## 2019-11-24 DIAGNOSIS — H5462 Unqualified visual loss, left eye, normal vision right eye: Secondary | ICD-10-CM | POA: Diagnosis present

## 2019-11-24 DIAGNOSIS — N1832 Chronic kidney disease, stage 3b: Secondary | ICD-10-CM | POA: Diagnosis present

## 2019-11-24 DIAGNOSIS — E876 Hypokalemia: Secondary | ICD-10-CM | POA: Diagnosis present

## 2019-11-24 DIAGNOSIS — N179 Acute kidney failure, unspecified: Secondary | ICD-10-CM | POA: Diagnosis present

## 2019-11-24 DIAGNOSIS — E785 Hyperlipidemia, unspecified: Secondary | ICD-10-CM | POA: Diagnosis present

## 2019-11-24 DIAGNOSIS — R079 Chest pain, unspecified: Secondary | ICD-10-CM | POA: Diagnosis not present

## 2019-11-24 DIAGNOSIS — I248 Other forms of acute ischemic heart disease: Secondary | ICD-10-CM | POA: Diagnosis present

## 2019-11-24 DIAGNOSIS — I161 Hypertensive emergency: Secondary | ICD-10-CM | POA: Diagnosis present

## 2019-11-24 DIAGNOSIS — E11319 Type 2 diabetes mellitus with unspecified diabetic retinopathy without macular edema: Secondary | ICD-10-CM | POA: Diagnosis present

## 2019-11-24 DIAGNOSIS — M549 Dorsalgia, unspecified: Secondary | ICD-10-CM | POA: Diagnosis present

## 2019-11-24 DIAGNOSIS — E1122 Type 2 diabetes mellitus with diabetic chronic kidney disease: Secondary | ICD-10-CM | POA: Diagnosis present

## 2019-11-24 DIAGNOSIS — Z20828 Contact with and (suspected) exposure to other viral communicable diseases: Secondary | ICD-10-CM | POA: Diagnosis present

## 2019-11-24 HISTORY — DX: Hypertensive urgency: I16.0

## 2019-11-24 LAB — BASIC METABOLIC PANEL
Anion gap: 11 (ref 5–15)
BUN: 17 mg/dL (ref 6–20)
CO2: 22 mmol/L (ref 22–32)
Calcium: 8 mg/dL — ABNORMAL LOW (ref 8.9–10.3)
Chloride: 106 mmol/L (ref 98–111)
Creatinine, Ser: 1.35 mg/dL — ABNORMAL HIGH (ref 0.44–1.00)
GFR calc Af Amer: 50 mL/min — ABNORMAL LOW (ref >60)
GFR calc non Af Amer: 43 mL/min — ABNORMAL LOW (ref >60)
Glucose, Bld: 207 mg/dL — ABNORMAL HIGH (ref 70–99)
Potassium: 3.5 mmol/L (ref 3.5–5.1)
Sodium: 139 mmol/L (ref 135–145)

## 2019-11-24 LAB — LIPID PANEL
Cholesterol: 316 mg/dL — ABNORMAL HIGH (ref 0–200)
HDL: 39 mg/dL — ABNORMAL LOW (ref >40)
LDL Cholesterol: UNDETERMINED mg/dL (ref 0–99)
Total CHOL/HDL Ratio: 8.1 RATIO
Triglycerides: 564 mg/dL — ABNORMAL HIGH (ref <150)
VLDL: UNDETERMINED mg/dL (ref 0–40)

## 2019-11-24 LAB — GLUCOSE, CAPILLARY
Glucose-Capillary: 163 mg/dL — ABNORMAL HIGH (ref 70–99)
Glucose-Capillary: 201 mg/dL — ABNORMAL HIGH (ref 70–99)
Glucose-Capillary: 205 mg/dL — ABNORMAL HIGH (ref 70–99)
Glucose-Capillary: 287 mg/dL — ABNORMAL HIGH (ref 70–99)
Glucose-Capillary: 300 mg/dL — ABNORMAL HIGH (ref 70–99)
Glucose-Capillary: 376 mg/dL — ABNORMAL HIGH (ref 70–99)

## 2019-11-24 LAB — CBC
HCT: 36.1 % (ref 36.0–46.0)
Hemoglobin: 11.8 g/dL — ABNORMAL LOW (ref 12.0–15.0)
MCH: 29.3 pg (ref 26.0–34.0)
MCHC: 32.7 g/dL (ref 30.0–36.0)
MCV: 89.6 fL (ref 80.0–100.0)
Platelets: 303 10*3/uL (ref 150–400)
RBC: 4.03 MIL/uL (ref 3.87–5.11)
RDW: 12.4 % (ref 11.5–15.5)
WBC: 7.9 10*3/uL (ref 4.0–10.5)
nRBC: 0 % (ref 0.0–0.2)

## 2019-11-24 LAB — HIV ANTIBODY (ROUTINE TESTING W REFLEX): HIV Screen 4th Generation wRfx: NONREACTIVE

## 2019-11-24 LAB — LDL CHOLESTEROL, DIRECT: Direct LDL: 140 mg/dL — ABNORMAL HIGH (ref 0–99)

## 2019-11-24 MED ORDER — TORSEMIDE 20 MG PO TABS
20.0000 mg | ORAL_TABLET | Freq: Every day | ORAL | Status: DC
  Filled 2019-11-24: qty 1

## 2019-11-24 MED ORDER — INSULIN ASPART PROT & ASPART (70-30 MIX) 100 UNIT/ML ~~LOC~~ SUSP
40.0000 [IU] | Freq: Two times a day (BID) | SUBCUTANEOUS | Status: DC
  Administered 2019-11-24 – 2019-11-26 (×4): 40 [IU] via SUBCUTANEOUS
  Filled 2019-11-24 (×4): qty 10

## 2019-11-24 NOTE — Progress Notes (Signed)
[facility] Progress Note  Patient: Terry Arroyo GGE:[id number]   PCP: Terry Arroyo DOB: March 21, 1961   DOA: 11/23/2019   DOS: 11/24/2019   Date of Service: the patient was seen and examined on 11/24/2019  Chief Complaint  Patient presents with   Chest Pain   Dizziness   Brief hospital course: Terry Arroyo is a 58 y.o. female with Past medical history of CAD SP CABG in 2018, HTN, HLD, type II DM with left eye vision loss likely from retinopathy, Covid infection in March 2020 in [place]. Patient presented with complaints of chest pain. She mentions that 1 month ago her PCP took her off of the Norvasc that she was taking for her blood pressure. 3 weeks ago she started having complaints of on and off chest pain associated with shortness of breath. For last 3 to 4 days patient actually has been complaining of orthopnea as well as PND. She has been increasing the number of the pillows that she is using. She has chronic swelling and denies any change in her swelling. She had a temperature with EMS but did not feel feverish at home. She reports having chills for last 1 week. No diarrhea no constipation. No recent Covid exposure. She remains compliant with her medication and does not drink or smoke alcohol or use recreational drugs.  Currently further plan is continue blood pressure control and further cardiac work-up.  Subjective: No more chest pain.  No nausea no vomiting.  No more dizziness.  No fever no chills.  Continues to have swelling in the legs.  Assessment and Plan: Scheduled Meds:  acetaminophen  1,000 mg Oral STAT   amLODipine  5 mg Oral Daily   aspirin EC  81 mg Oral Daily   atorvastatin  80 mg Oral q1800   Chlorhexidine Gluconate Cloth  6 each Topical Q0600   fenofibrate  160 mg Oral Daily   heparin  5,000 Units Subcutaneous Q8H   hydrALAZINE  50 mg Oral Q8H   insulin aspart  0-15 Units Subcutaneous TID WC   insulin aspart  0-5 Units  Subcutaneous QHS   insulin aspart protamine- aspart  40 Units Subcutaneous BID WC   isosorbide mononitrate  30 mg Oral Daily   lisinopril  30 mg Oral Daily   metoprolol tartrate  25 mg Oral BID   mirtazapine  30 mg Oral QHS   mometasone-formoterol  2 puff Inhalation BID   mupirocin ointment  1 application Nasal BID   topiramate  25 mg Oral Daily   torsemide  10 mg Oral Daily   Continuous Infusions: PRN Meds: acetaminophen, morphine injection, nitroGLYCERIN, ondansetron (ZOFRAN) IV 1.  Chest pain Essential Hypertension hypertensive emergency Elevated troponin Presents with complaints of chest pain. Blood pressure significantly elevated. Likely this is all demand ischemia from high blood pressure. High blood pressure is likely from recent change in her medication. We will continue blood pressure medication. Check echocardiogram. If the echocardiogram is abnormal or the troponin is significantly abnormal patient may require to be placed on heparin drip and cardiology should be consulted. Continue close monitoring.  2.  Dizziness resolved Likely secondary to high blood pressure. CNS examination unremarkable. CT head unremarkable as well. Monitor.  3. Type 2 Diabetes Mellitus, moderately controled with ophthalmic complication hemoglobin Y6T 14.6 currently on sliding scale moderate. Increase home insulin as well. Pt reports compliance but appears to have uncontrolled sugars at home  4.  Hyperlipidemia. Continue home regimen.  Nutrition: Cardiac diet  and Carb modified diet DVT Prophylaxis: Subcutaneous Heparin   Advance goals of care discussion: Full code  Family Communication: no family was present at bedside, at the time of interview.   Disposition:  Discharge to home.  Consultants: none Procedures: Echocardiogram   Antibiotics: Anti-infectives (From admission, onward)   None       Objective: Physical Exam: Vitals:   11/24/19 1230 11/24/19 1300  11/24/19 1400 11/24/19 1415  BP: (!) 134/108   (!) 142/78  Pulse: 70 (!) 108 64 75  Resp: 18 (!) 21 18 (!) 22  Temp:      TempSrc:      SpO2: 98% 95% 98% 95%  Weight:      Height:        Intake/Output Summary (Last 24 hours) at 11/24/2019 1603 Last data filed at 11/24/2019 0000 Gross per 24 hour  Intake 360 ml  Output --  Net 360 ml   Filed Weights   11/23/19 1225  Weight: 81.2 kg   General: alert and oriented to time, place, and person. Appear in mild distress, affect appropriate Eyes: PERRL, Conjunctiva normal ENT: Oral Mucosa Clear, moist  Neck: no JVD, no Abnormal Mass Or lumps Cardiovascular: S1 and S2 Present, no Murmur,  Respiratory: good respiratory effort, Bilateral Air entry equal and Decreased, no signs of accessory muscle use, Clear to Auscultation, no Crackles, no wheezes Abdomen: Bowel Sound present, Soft and no tenderness, no hernia Skin: no rashes  Extremities: bilateral  Pedal edema, no calf tenderness Neurologic: without any new focal findings Gait not checked due to patient safety concerns  Data Reviewed: I have personally reviewed and interpreted daily labs, tele strips, imagings as discussed above. I reviewed all nursing notes, pharmacy notes, vitals, pertinent old records I have discussed plan of care as described above with RN and patient/family.  CBC: Recent Labs  Lab 11/23/19 1234 11/24/19 0544  WBC 7.9 7.9  HGB 12.3 11.8*  HCT 36.4 36.1  MCV 86.5 89.6  PLT 310 532   Basic Metabolic Panel: Recent Labs  Lab 11/23/19 1234 11/24/19 0544  NA 137 139  K 3.9 3.5  CL 104 106  CO2 21* 22  GLUCOSE 462* 207*  BUN 12 17  CREATININE 1.28* 1.35*  CALCIUM 8.2* 8.0*   Liver Function Tests: No results for input(s): AST, ALT, ALKPHOS, BILITOT, PROT, ALBUMIN in the last 168 hours. No results for input(s): LIPASE, AMYLASE in the last 168 hours. No results for input(s): AMMONIA in the last 168 hours. Coagulation Profile: No results for input(s):  INR, PROTIME in the last 168 hours. Cardiac Enzymes: No results for input(s): CKTOTAL, CKMB, CKMBINDEX, TROPONINI in the last 168 hours. BNP (last 3 results) No results for input(s): PROBNP in the last 8760 hours. CBG: Recent Labs  Lab 11/23/19 2316 11/24/19 0054 11/24/19 0328 11/24/19 0812 11/24/19 1206  GLUCAP 359* 287* 201* 300* 376*   Studies: Ct Head Wo Contrast  Result Date: 11/23/2019 CLINICAL DATA:  Chest pain and dizzy EXAM: CT HEAD WITHOUT CONTRAST TECHNIQUE: Contiguous axial images were obtained from the base of the skull through the vertex without intravenous contrast. COMPARISON:  11/18/2013 CT FINDINGS: Brain: No acute territorial infarction or intracranial mass is visualized. Hyperdense dural venous sinuses, similar as compared with 2014. Hypodense filling defect in the superior sagittal sinus extending to the torcula and transverse sinuses, similar distribution as compared with prior CT. Stable ventricle size. Postsurgical changes in the sellar region. Vascular: Carotid vascular calcification. Skull: Right posterior craniotomy changes.  No fracture Sinuses/Orbits: Postsurgical changes of the sinuses. Mild mucosal thickening is evident Other: None IMPRESSION: 1. No definite acute interval change in appearance of the brain since comparison study from November 2014. 2. Generalized hyperdensity in the region of the dural venous sinuses with filling defect in the superior sagittal sinus, extending to the torcula and the transverse sinuses, possibly due to chronic thrombosis. These findings were all present on the 2014 study and do not appear significantly changed. 3. Postoperative changes of the sellar region. Electronically Signed   By: Donavan Foil M.D.   On: 11/23/2019 18:12   Ct Angio Chest Pe W And/or Wo Contrast  Result Date: 11/23/2019 CLINICAL DATA:  Pleuritic chest pain EXAM: CT ANGIOGRAPHY CHEST WITH CONTRAST TECHNIQUE: Multidetector CT imaging of the chest was performed  using the standard protocol during bolus administration of intravenous contrast. Multiplanar CT image reconstructions and MIPs were obtained to evaluate the vascular anatomy. CONTRAST:  34mL OMNIPAQUE IOHEXOL 350 MG/ML SOLN COMPARISON:  Chest x-ray 11/23/2019 FINDINGS: Cardiovascular: Satisfactory opacification of the pulmonary arteries to the segmental level. No evidence of pulmonary embolism. Mild aortic atherosclerosis. No aneurysmal dilatation. Coronary vascular calcification. Cardiomegaly. No pericardial effusion. Mediastinum/Nodes: Midline trachea. No thyroid mass. Mildly prominent low right paratracheal node measuring 11 mm. Esophagus within normal limits. Lungs/Pleura: Mild right greater than left posterior pleural thickening. 4 mm right upper lobe subpleural lung nodule, series 6, image number 21. Mild subpleural fibrosis within the right middle lobe and peripheral right base. Minimal emphysema with scattered lung cysts. Upper Abdomen: No acute abnormality. Musculoskeletal: Post sternotomy changes. No acute or suspicious osseous abnormality Review of the MIP images confirms the above findings. IMPRESSION: 1. Negative for acute pulmonary embolus. 2. No acute pulmonary infiltrate. Mild subpleural fibrosis in the right middle lobe and right lung base. 3. Cardiomegaly Aortic Atherosclerosis (ICD10-I70.0) and Emphysema (ICD10-J43.9). Electronically Signed   By: Donavan Foil M.D.   On: 11/23/2019 18:00   Time spent: 35 minutes  Author: Berle Mull, Arroyo [facility] 11/24/2019 4:03 PM  To reach On-call, see care teams to locate the attending and reach out to them via [email or web address]. If 7PM-7AM, please contact night-coverage If you still have difficulty reaching the attending provider, please page the [facility] (Director on Call) for [facility] on amion for assistance.

## 2019-11-24 NOTE — Plan of Care (Signed)
Patient denies chest pain at this time.  BP has decreased within normal limits.  No complaints of distress.  Patient asleep most of shift.  Blood sugar decreasing currently in 200s.  Coverage given. Will continue to monitor.

## 2019-11-25 LAB — BASIC METABOLIC PANEL
Anion gap: 10 (ref 5–15)
BUN: 19 mg/dL (ref 6–20)
CO2: 24 mmol/L (ref 22–32)
Calcium: 8.1 mg/dL — ABNORMAL LOW (ref 8.9–10.3)
Chloride: 103 mmol/L (ref 98–111)
Creatinine, Ser: 1.57 mg/dL — ABNORMAL HIGH (ref 0.44–1.00)
GFR calc Af Amer: 42 mL/min — ABNORMAL LOW (ref >60)
GFR calc non Af Amer: 36 mL/min — ABNORMAL LOW (ref >60)
Glucose, Bld: 173 mg/dL — ABNORMAL HIGH (ref 70–99)
Potassium: 3.2 mmol/L — ABNORMAL LOW (ref 3.5–5.1)
Sodium: 137 mmol/L (ref 135–145)

## 2019-11-25 LAB — CBC
HCT: 37.5 % (ref 36.0–46.0)
Hemoglobin: 12.5 g/dL (ref 12.0–15.0)
MCH: 29.1 pg (ref 26.0–34.0)
MCHC: 33.3 g/dL (ref 30.0–36.0)
MCV: 87.4 fL (ref 80.0–100.0)
Platelets: 317 10*3/uL (ref 150–400)
RBC: 4.29 MIL/uL (ref 3.87–5.11)
RDW: 12.3 % (ref 11.5–15.5)
WBC: 7.2 10*3/uL (ref 4.0–10.5)
nRBC: 0 % (ref 0.0–0.2)

## 2019-11-25 LAB — GLUCOSE, CAPILLARY
Glucose-Capillary: 197 mg/dL — ABNORMAL HIGH (ref 70–99)
Glucose-Capillary: 348 mg/dL — ABNORMAL HIGH (ref 70–99)
Glucose-Capillary: 192 mg/dL — ABNORMAL HIGH (ref 70–99)
Glucose-Capillary: 92 mg/dL (ref 70–99)

## 2019-11-25 LAB — ECHOCARDIOGRAM COMPLETE
Height: 65 in
Weight: 2864 oz

## 2019-11-25 MED ORDER — POTASSIUM CHLORIDE CRYS ER 20 MEQ PO TBCR
40.0000 meq | EXTENDED_RELEASE_TABLET | ORAL | Status: AC
  Administered 2019-11-25: 40 meq via ORAL
  Filled 2019-11-25: qty 2

## 2019-11-25 MED ORDER — SODIUM CHLORIDE 0.9% FLUSH
10.0000 mL | Freq: Two times a day (BID) | INTRAVENOUS | Status: DC
  Administered 2019-11-25: 10 mL via INTRAVENOUS

## 2019-11-25 MED ORDER — FUROSEMIDE 10 MG/ML IJ SOLN: 40.0000 mg | Freq: Two times a day (BID) | INTRAMUSCULAR | Status: DC

## 2019-11-25 MED ORDER — BISACODYL 5 MG PO TBEC
5.0000 mg | DELAYED_RELEASE_TABLET | Freq: Every day | ORAL | Status: DC | PRN
  Administered 2019-11-26: 5 mg via ORAL
  Filled 2019-11-25: qty 1

## 2019-11-25 MED ORDER — AMLODIPINE BESYLATE 5 MG PO TABS
5.0000 mg | ORAL_TABLET | Freq: Two times a day (BID) | ORAL | Status: DC
  Administered 2019-11-25 – 2019-11-26 (×3): 5 mg via ORAL
  Filled 2019-11-25 (×2): qty 1

## 2019-11-25 MED ORDER — POTASSIUM CHLORIDE CRYS ER 20 MEQ PO TBCR
20.0000 meq | EXTENDED_RELEASE_TABLET | Freq: Two times a day (BID) | ORAL | Status: DC
  Filled 2019-11-25: qty 1

## 2019-11-25 MED ORDER — METHOCARBAMOL 500 MG PO TABS
500.0000 mg | ORAL_TABLET | Freq: Three times a day (TID) | ORAL | Status: DC | PRN
  Administered 2019-11-25: 500 mg via ORAL
  Filled 2019-11-25 (×4): qty 1

## 2019-11-25 MED ORDER — LIDOCAINE 5 % EX PTCH
1.0000 | MEDICATED_PATCH | CUTANEOUS | Status: DC
  Administered 2019-11-25 – 2019-11-26 (×2): 1 via TRANSDERMAL
  Filled 2019-11-25 (×2): qty 1

## 2019-11-25 MED ORDER — FUROSEMIDE 10 MG/ML IJ SOLN
40.0000 mg | Freq: Once | INTRAMUSCULAR | Status: AC
  Administered 2019-11-25: 40 mg via INTRAVENOUS
  Filled 2019-11-25: qty 4

## 2019-11-25 NOTE — Plan of Care (Signed)
Patient alert and oriented.  No acute concerns.  No complaints of pain or discomfort.  No complaints of chest pain during shift.  Will continiue to monitor.

## 2019-11-25 NOTE — Progress Notes (Signed)
[facility] Progress Note  Patient: Terry Arroyo WCB:[id number]   PCP: Kirk Ruths, MD DOB: [birth year]   DOA: 11/23/2019   DOS: 11/25/2019   Date of Service: the patient was seen and examined on 11/25/2019  Chief Complaint  Patient presents with  . Chest Pain  . Dizziness   Brief hospital course: Terry Arroyo is a 58 y.o. female with Past medical history of CAD SP CABG in 2018, HTN, HLD, type II DM with left eye vision loss likely from retinopathy, Covid infection in March 2020 in [place]. Patient presented with complaints of chest pain. She mentions that 1 month ago her PCP took her off of the Norvasc that she was taking for her blood pressure. 3 weeks ago she started having complaints of on and off chest pain associated with shortness of breath. For last 3 to 4 days patient actually has been complaining of orthopnea as well as PND. She has been increasing the number of the pillows that she is using. She has chronic swelling and denies any change in her swelling. She had a temperature with EMS but did not feel feverish at home. She reports having chills for last 1 week. No diarrhea no constipation. No recent Covid exposure. She remains compliant with her medication and does not drink or smoke alcohol or use recreational drugs.  Currently further plan is continue blood pressure control and further cardiac work-up.  Subjective: Breathing okay.  No more chest pain.  Continues to have swelling in the leg.  Reports some back pain which is chronic.  No dizziness no lightheadedness.  Assessment and Plan: Scheduled Meds: . amLODipine  5 mg Oral BID  . aspirin EC  81 mg Oral Daily  . atorvastatin  80 mg Oral q1800  . Chlorhexidine Gluconate Cloth  6 each Topical Q0600  . fenofibrate  160 mg Oral Daily  . furosemide  40 mg Intravenous Once  . heparin  5,000 Units Subcutaneous Q8H  . hydrALAZINE  50 mg Oral Q8H  . insulin aspart  0-15 Units Subcutaneous TID WC  .  insulin aspart  0-5 Units Subcutaneous QHS  . insulin aspart protamine- aspart  40 Units Subcutaneous BID WC  . isosorbide mononitrate  30 mg Oral Daily  . lidocaine  1 patch Transdermal Q24H  . metoprolol tartrate  25 mg Oral BID  . mirtazapine  30 mg Oral QHS  . mometasone-formoterol  2 puff Inhalation BID  . mupirocin ointment  1 application Nasal BID  . potassium chloride  40 mEq Oral Q2H  . topiramate  25 mg Oral Daily   Continuous Infusions: PRN Meds: acetaminophen, methocarbamol, nitroGLYCERIN, ondansetron (ZOFRAN) IV 1.  Chest pain/resolved Essential Hypertension hypertensive emergency Elevated troponin Presents with complaints of chest pain. Blood pressure significantly elevated. Likely this is all demand ischemia from high blood pressure. High blood pressure is likely from recent change in her medication. Echocardiogram shows EF of 55 to 60%, no LVH.  No valvular abnormality. Continue 81 mg aspirin, increase Norvasc from 5 mg daily to 5 mg twice daily. Holding lisinopril in the setting of worsening renal function. Continue hydralazine 50 mg 3 times daily. Continuing Imdur 30 mg daily. Continue Lopressor 25 mg twice daily. Switching torsemide 10 mg daily to Lasix 40 mg IV x1 today. Continue close monitoring.  2.  Dizziness resolved Likely secondary to high blood pressure. CNS examination unremarkable. CT head unremarkable as well. Monitor.  3. Type 2 Diabetes Mellitus,  poorly controled with  ophthalmic complication hemoglobin H8N 14.6 currently on sliding scale moderate. Increase home insulin as well. Pt reports compliance but appears to have uncontrolled sugars at home  4.  Hyperlipidemia. Continue home regimen. Total cholesterol 316, triglyceride 564, unable to calculate LDL. Will need a repeat lab in 1 month.  5.  Acute kidney injury on chronic kidney disease stage IIIb. Baseline serum creatinine 1.28, EGFR 42. Currently serum creatinine 1.57. Likely  due to hemodynamic injury from hypertension. Monitor while receiving IV diuresis.  6.  Hypokalemia. Replacing orally.  7. Chronic back pain. Heating pad, Robaxin,  Nutrition: Cardiac diet and Carb modified diet DVT Prophylaxis: Subcutaneous Heparin   Advance goals of care discussion: Full code  Family Communication: no family was present at bedside, at the time of interview.   Disposition:  Discharge to home 11/26/19.  Consultants: none Procedures: Echocardiogram   Antibiotics: Anti-infectives (From admission, onward)   None       Objective: Physical Exam: Vitals:   11/25/19 0000 11/25/19 0100 11/25/19 0200 11/25/19 0639  BP:    (!) 182/92  Pulse: 64 75 62   Resp: 19 12 18    Temp:      TempSrc:      SpO2: 98% 98% 98%   Weight:      Height:        Intake/Output Summary (Last 24 hours) at 11/25/2019 0913 Last data filed at 11/25/2019 0600 Gross per 24 hour  Intake 720 ml  Output 1 ml  Net 719 ml   Filed Weights   11/23/19 1225  Weight: 81.2 kg   General: alert and oriented to time, place, and person. Appear in mild distress, affect appropriate Eyes: PERRL, Conjunctiva normal ENT: Oral Mucosa Clear, moist  Neck: no JVD, no Abnormal Mass Or lumps Cardiovascular: S1 and S2 Present, no Murmur, peripheral pulses symmetrical Respiratory: good respiratory effort, Bilateral Air entry equal and Decreased, no signs of accessory muscle use, Clear to Auscultation, no Crackles, no wheezes Abdomen: Bowel Sound present, Soft and no tenderness, no hernia Skin: no rashes  Extremities: bilateral  Pedal edema, no calf tenderness Neurologic: without any new focal findings Gait not checked due to patient safety concerns  Lower back tenderness no radiculopathy   Data Reviewed: I have personally reviewed and interpreted daily labs, tele strips, imagings as discussed above. I reviewed all nursing notes, pharmacy notes, vitals, pertinent old records I have discussed plan of  care as described above with RN and patient/family.  CBC: Recent Labs  Lab 11/23/19 1234 11/24/19 0544 11/25/19 0456  WBC 7.9 7.9 7.2  HGB 12.3 11.8* 12.5  HCT 36.4 36.1 37.5  MCV 86.5 89.6 87.4  PLT 310 303 277   Basic Metabolic Panel: Recent Labs  Lab 11/23/19 1234 11/24/19 0544 11/25/19 0456  NA 137 139 137  K 3.9 3.5 3.2*  CL 104 106 103  CO2 21* 22 24  GLUCOSE 462* 207* 173*  BUN 12 17 19   CREATININE 1.28* 1.35* 1.57*  CALCIUM 8.2* 8.0* 8.1*   Liver Function Tests: No results for input(s): AST, ALT, ALKPHOS, BILITOT, PROT, ALBUMIN in the last 168 hours. No results for input(s): LIPASE, AMYLASE in the last 168 hours. No results for input(s): AMMONIA in the last 168 hours. Coagulation Profile: No results for input(s): INR, PROTIME in the last 168 hours. Cardiac Enzymes: No results for input(s): CKTOTAL, CKMB, CKMBINDEX, TROPONINI in the last 168 hours. BNP (last 3 results) No results for input(s): PROBNP in the last 8760 hours. CBG:  Recent Labs  Lab 11/24/19 0812 11/24/19 1206 11/24/19 1608 11/24/19 2226 11/25/19 0741  GLUCAP 300* 376* 205* 163* 197*   Studies: No results found. Time spent: 35 minutes  Author: Berle Mull, MD [facility] 11/25/2019 9:13 AM  To reach On-call, see care teams to locate the attending and reach out to them via [email or web address]. If 7PM-7AM, please contact night-coverage If you still have difficulty reaching the attending provider, please page the [facility] (Director on Call) for [facility] on amion for assistance.

## 2019-11-26 LAB — BASIC METABOLIC PANEL
Anion gap: 8 (ref 5–15)
BUN: 23 mg/dL — ABNORMAL HIGH (ref 6–20)
CO2: 23 mmol/L (ref 22–32)
Calcium: 8.1 mg/dL — ABNORMAL LOW (ref 8.9–10.3)
Chloride: 107 mmol/L (ref 98–111)
Creatinine, Ser: 1.55 mg/dL — ABNORMAL HIGH (ref 0.44–1.00)
GFR calc Af Amer: 43 mL/min — ABNORMAL LOW (ref >60)
GFR calc non Af Amer: 37 mL/min — ABNORMAL LOW (ref >60)
Glucose, Bld: 134 mg/dL — ABNORMAL HIGH (ref 70–99)
Potassium: 3.8 mmol/L (ref 3.5–5.1)
Sodium: 138 mmol/L (ref 135–145)

## 2019-11-26 LAB — CBC
HCT: 37.4 % (ref 36.0–46.0)
Hemoglobin: 12.2 g/dL (ref 12.0–15.0)
MCH: 29.4 pg (ref 26.0–34.0)
MCHC: 32.6 g/dL (ref 30.0–36.0)
MCV: 90.1 fL (ref 80.0–100.0)
Platelets: 326 10*3/uL (ref 150–400)
RBC: 4.15 MIL/uL (ref 3.87–5.11)
RDW: 12.5 % (ref 11.5–15.5)
WBC: 8.1 10*3/uL (ref 4.0–10.5)
nRBC: 0 % (ref 0.0–0.2)

## 2019-11-26 LAB — GLUCOSE, CAPILLARY: Glucose-Capillary: 164 mg/dL — ABNORMAL HIGH (ref 70–99)

## 2019-11-26 MED ORDER — INSULIN ASPART PROT & ASPART (70-30 MIX) 100 UNIT/ML ~~LOC~~ SUSP: 40.0000 [IU] | Freq: Two times a day (BID) | SUBCUTANEOUS | 0 refills | Status: DC

## 2019-11-26 MED ORDER — MIRTAZAPINE 30 MG PO TABS: 30.0000 mg | ORAL_TABLET | Freq: Every day | ORAL | 0 refills | Status: DC

## 2019-11-26 MED ORDER — HYDROCODONE-ACETAMINOPHEN 5-325 MG PO TABS
2.0000 | ORAL_TABLET | Freq: Once | ORAL | Status: AC
  Administered 2019-11-26: 2 via ORAL
  Filled 2019-11-26: qty 2

## 2019-11-26 MED ORDER — AMLODIPINE BESYLATE 5 MG PO TABS: 5.0000 mg | ORAL_TABLET | Freq: Two times a day (BID) | ORAL | 0 refills | Status: DC

## 2019-11-26 MED ORDER — METHOCARBAMOL 500 MG PO TABS: 500.0000 mg | ORAL_TABLET | Freq: Three times a day (TID) | ORAL | 0 refills | Status: DC | PRN

## 2019-11-26 MED ORDER — METOPROLOL TARTRATE 25 MG PO TABS: 25.0000 mg | ORAL_TABLET | Freq: Two times a day (BID) | ORAL | 0 refills | Status: DC

## 2019-11-26 MED ORDER — HYDRALAZINE HCL 50 MG PO TABS: 50.0000 mg | ORAL_TABLET | Freq: Three times a day (TID) | ORAL | 0 refills | Status: DC

## 2019-11-26 MED ORDER — ASPIRIN 81 MG PO TBEC: 81.0000 mg | DELAYED_RELEASE_TABLET | Freq: Every day | ORAL | 0 refills | Status: DC

## 2019-11-26 MED ORDER — FUROSEMIDE 20 MG PO TABS: 20.0000 mg | ORAL_TABLET | Freq: Every day | ORAL | 0 refills | Status: DC

## 2019-11-26 MED ORDER — ATORVASTATIN CALCIUM 80 MG PO TABS: 80.0000 mg | ORAL_TABLET | Freq: Every day | ORAL | 0 refills | Status: AC

## 2019-11-26 MED ORDER — ISOSORBIDE MONONITRATE ER 30 MG PO TB24: 30.0000 mg | ORAL_TABLET | Freq: Every day | ORAL | 0 refills | Status: DC

## 2019-11-26 MED ORDER — FUROSEMIDE 20 MG PO TABS
20.0000 mg | ORAL_TABLET | Freq: Every day | ORAL | Status: DC
  Administered 2019-11-26: 20 mg via ORAL
  Filled 2019-11-26: qty 1

## 2019-11-26 NOTE — Progress Notes (Signed)
Discharge instructions given to patient. IV and monitor removed. All questions answered. Patient is waiting for her ride to arrive.

## 2019-11-26 NOTE — TOC Transition Note (Signed)
Transition of Care [facility]) - CM/SW Discharge Note   Patient Details  Name: Terry Arroyo MRN: [id number] Date of Birth: Sep 08, 1961  [facility] of [facility]) CM/SW Contact:  Ross Ludwig, LCSW Phone Number: 11/26/2019, 10:24 AM   Clinical Narrative:    Patient is a 58 year old female who is alert and oriented x4.  Patient states she has had home health in the past in [place] when she was living there.  Patient states she has been in [place] for about 2 years, and states there are things that she misses about [place].  She has family in [place] which is the reason she moved down here.  Patient requested a cane, CSW spoke to [place] from DME and he will deliver once he arrives.   Final next level of care: [place] Barriers to Discharge: Barriers Resolved   Patient Goals and CMS Choice Patient states their goals for this hospitalization and ongoing recovery are:: To return back home and get better. CMS Medicare.gov Compare Post Acute Care list provided to:: Patient Choice offered to / list presented to : Patient  Discharge Placement  Patient will be discharging back home with home health PT, RN, and social work with [employer].                     Discharge Plan and Services                DME Arranged: Kasandra Knudsen DME Agency: [employer] Date DME Agency Contacted: 11/26/19 Time DME Agency Contacted: 73 Representative spoke with at DME Agency: Kay: RN, PT, Social Work [facility] Agency: [place] Date [place]: 11/26/19 Time [place]: 1024 Representative spoke with at [place]: [place] ([place]) Interventions     Readmission Risk Interventions No flowsheet data found.

## 2019-11-27 NOTE — Discharge Summary (Signed)
[facility] Discharge Summary   Patient: Terry Arroyo LKT:[id number]   PCP: Kirk Ruths, MD DOB: September 17, 1961   Date of admission: 11/23/2019   Date of discharge: 11/26/2019     Discharge Diagnoses:  Principal diagnosis   Hypertensive urgency Active Problems:   Chest pain  Admitted From: home Disposition:  Home with home health  Recommendations for Outpatient Follow-up:  1. PCP: please follow up in 1 week for blood pressure and DM management. Also need a referral to Cardiology 2. Follow up LABS/TEST:  none  Follow-up Information    Kirk Ruths, MD. Go on 12/04/2019.   Specialty: Internal Medicine Why: Appointment at 3:45pm Contact information: [place] [id number] [phone]          Diet recommendation: Cardiac diet and Carb modified diet  Activity: The patient is advised to gradually reintroduce usual activities,as tolerated  Discharge Condition: good  Code Status: Full code   History of present illness: As per the H and P dictated on admission, " Terry Arroyo is a 58 y.o. female with Past medical history of CAD SP CABG in 2018, HTN, HLD, type II DM with left eye vision loss likely from retinopathy, Covid infection in March 2020 in [place]. Patient presented with complaints of chest pain. She mentions that 1 month ago her PCP took her off of the Norvasc that she was taking for her blood pressure. 3 weeks ago she started having complaints of on and off chest pain associated with shortness of breath. For last 3 to 4 days patient actually has been complaining of orthopnea as well as PND. She has been increasing the number of the pillows that she is using. She has chronic swelling and denies any change in her swelling. She had a temperature with EMS but did not feel feverish at home. She reports having chills for last 1 week. No diarrhea no constipation. No recent Covid exposure. She remains  compliant with her medication and does not drink or smoke alcohol or use recreational drugs."  Hospital Course:  Summary of her active problems in the hospital is as following. 1.Chest pain/resolved Essential Hypertension hypertensive emergency Elevated troponin Presents with complaints of chest pain. Blood pressure significantly elevated. Likely this is all demand ischemia from high blood pressure. High blood pressure is likely from recent change in her medication. Echocardiogram shows EF of 55 to 60%, no LVH.  No valvular abnormality. Continue 81 mg aspirin, increase Norvasc from 5 mg daily to 5 mg twice daily. Holding lisinopril in the setting of worsening renal function. Continue hydralazine 50 mg 3 times daily. Continuing Imdur 30 mg daily. Continue Lopressor 25 mg twice daily. Switching torsemide 10 mg daily  2.Dizziness resolved Likely secondary to high blood pressure. CNS examination unremarkable. CT head unremarkable as well. Monitor.  3. Type 2 Diabetes Mellitus, poorlycontroled with ophthalmiccomplication hemoglobin B5D 14.6 Increase home insulin as well. Pt reports compliance but appears to have uncontrolled sugars at home  4.Hyperlipidemia. Continue home regimen. Total cholesterol 316, triglyceride 564, unable to calculate LDL. Will need a repeat lab in 1 month.  5.  Acute kidney injury on chronic kidney disease stage IIIb. Baseline serum creatinine 1.28, EGFR 42. Currently serum creatinine 1.57. stable Likely due to hemodynamic injury from hypertension.  6.  Hypokalemia. Replaced  7. Chronic back pain. Heating pad, Robaxin,  Home health was arranged. On the day of the discharge the patient's vitals were stable, and  no other acute medical condition were reported by patient. the patient was felt safe to be discharge at Home with Home health.  Consultants: none Procedures: none  DISCHARGE MEDICATION: Allergies as of 11/26/2019       Reactions   Shellfish Allergy Anaphylaxis   Shrimp/lobster  Shrimp/lobster    Kiwi Extract Swelling   Metformin Other (See Comments)   Other reaction(s): GI Intolerance      Medication List    TAKE these medications   amLODipine 5 MG tablet Commonly known as: NORVASC Take 1 tablet (5 mg total) by mouth 2 (two) times daily.   aspirin 81 MG EC tablet Take 1 tablet (81 mg total) by mouth daily.   atorvastatin 80 MG tablet Commonly known as: LIPITOR Take 1 tablet (80 mg total) by mouth daily at 6 PM.   Fluticasone-Salmeterol 250-50 MCG/DOSE Aepb Commonly known as: ADVAIR Inhale 1 puff into the lungs 2 (two) times daily.   hydrALAZINE 50 MG tablet Commonly known as: APRESOLINE Take 1 tablet (50 mg total) by mouth 3 (three) times daily.   insulin aspart protamine- aspart (70-30) 100 UNIT/ML injection Commonly known as: NOVOLOG MIX 70/30 Inject 0.4 mLs (40 Units total) into the skin 2 (two) times daily with a meal.   isosorbide mononitrate 30 MG 24 hr tablet Commonly known as: IMDUR Take 1 tablet (30 mg total) by mouth daily.   methocarbamol 500 MG tablet Commonly known as: ROBAXIN Take 1 tablet (500 mg total) by mouth every 8 (eight) hours as needed for muscle spasms.   metoprolol tartrate 25 MG tablet Commonly known as: LOPRESSOR Take 1 tablet (25 mg total) by mouth 2 (two) times daily.   mirtazapine 30 MG tablet Commonly known as: REMERON Take 1 tablet (30 mg total) by mouth at bedtime.   topiramate 25 MG capsule Commonly known as: TOPAMAX Take 25 mg by mouth daily.   torsemide 10 MG tablet Commonly known as: DEMADEX Take 10 mg by mouth daily.      Allergies  Allergen Reactions  . Shellfish Allergy Anaphylaxis    Shrimp/lobster  Shrimp/lobster    . Kiwi Extract Swelling  . Metformin Other (See Comments)    Other reaction(s): GI Intolerance   Discharge Instructions    Ambulatory referral to Cardiology   Complete by: As directed    Diet - low sodium  heart healthy   Complete by: As directed    Discharge instructions   Complete by: As directed    It is important that you read the given instructions as well as go over your medication list with RN to help you understand your care after this hospitalization.  Please follow-up with PCP in 1-2 weeks.  Please note that NO REFILLS for any discharge medications will be authorized once you are discharged, as it is imperative that you return to your primary care physician (or establish a relationship with a primary care physician if you do not have one) for your aftercare needs so that they can reassess your need for medications and monitor your lab values.  Please request your primary care physician to go over all Hospital Tests and Procedure/Radiological results at the follow up. Please get all Hospital records sent to your PCP by signing hospital release before you go home.   Do not take more than prescribed Pain, Sleep and Anxiety Medications.  You were cared for by a hospitalist during your hospital stay. If you have any questions about your discharge medications or the care you  received while you were in the hospital after you are discharged, you can call the unit @UNIT @ you were admitted to and ask to speak with the hospitalist Berle Mull. Ask for Hospitalist on call if the hospitalist that took care of you is not available.   Once you are discharged, your primary care physician will handle any further medical issues.  You Must read complete instructions/literature along with all the possible adverse reactions/side effects for all the Medicines you take and that have been prescribed to you. Take any new Medicines after you have completely understood and accept all the possible adverse reactions/side effects.  If you have smoked or chewed Tobacco in the last 2 yrs please STOP smoking If you drink alcohol, please safely reduce the use. Do not drive, operating heavy machinery, perform activities  at heights, swimming or participation in water activities or provide baby sitting services under influence.  Wear Seat belts while driving.   Increase activity slowly   Complete by: As directed      Discharge Exam: Filed Weights   11/23/19 1225 11/26/19 0454  Weight: 81.2 kg 80.6 kg   Vitals:   11/26/19 0454 11/26/19 0748  BP: (!) 141/64 136/79  Pulse: 65 68  Resp: 18 18  Temp: 98.4 F (36.9 C) 98.1 F (36.7 C)  SpO2: 99% 94%   General: Appear in no distress, no Rash; Oral Mucosa Clear, moist. no Abnormal Mass Or lumps Cardiovascular: S1 and S2 Present, no Murmur, Respiratory: normal respiratory effort, Bilateral Air entry present and Clear to Auscultation, no Crackles, no wheezes Abdomen: Bowel Sound present, Soft and no tenderness, no hernia Extremities: no Pedal edema, no calf tenderness Neurology: alert and oriented to time, place, and person affect appropriate.  The results of significant diagnostics from this hospitalization (including imaging, microbiology, ancillary and laboratory) are listed below for reference.    Significant Diagnostic Studies: Dg Chest 2 View  Result Date: 11/23/2019 CLINICAL DATA:  Chest pain, dizziness EXAM: CHEST - 2 VIEW COMPARISON:  None. FINDINGS: Post CABG changes. Mild cardiomegaly. Rounded densities in the left hilar region likely calcified hilar lymph nodes. Trace right pleural effusion. Mild streaky left basilar opacity. No pneumothorax. Osseous structures are within normal limits. IMPRESSION: 1. Trace right pleural effusion. 2. Mild streaky left basilar opacity, atelectasis versus infiltrate. 3. Mild cardiomegaly without edema. Electronically Signed   By: Davina Poke M.D.   On: 11/23/2019 13:31   Ct Head Wo Contrast  Result Date: 11/23/2019 CLINICAL DATA:  Chest pain and dizzy EXAM: CT HEAD WITHOUT CONTRAST TECHNIQUE: Contiguous axial images were obtained from the base of the skull through the vertex without intravenous contrast.  COMPARISON:  11/18/2013 CT FINDINGS: Brain: No acute territorial infarction or intracranial mass is visualized. Hyperdense dural venous sinuses, similar as compared with 2014. Hypodense filling defect in the superior sagittal sinus extending to the torcula and transverse sinuses, similar distribution as compared with prior CT. Stable ventricle size. Postsurgical changes in the sellar region. Vascular: Carotid vascular calcification. Skull: Right posterior craniotomy changes.  No fracture Sinuses/Orbits: Postsurgical changes of the sinuses. Mild mucosal thickening is evident Other: None IMPRESSION: 1. No definite acute interval change in appearance of the brain since comparison study from November 2014. 2. Generalized hyperdensity in the region of the dural venous sinuses with filling defect in the superior sagittal sinus, extending to the torcula and the transverse sinuses, possibly due to chronic thrombosis. These findings were all present on the 2014 study and do not appear significantly  changed. 3. Postoperative changes of the sellar region. Electronically Signed   By: Donavan Foil M.D.   On: 11/23/2019 18:12   Ct Angio Chest Pe W And/or Wo Contrast  Result Date: 11/23/2019 CLINICAL DATA:  Pleuritic chest pain EXAM: CT ANGIOGRAPHY CHEST WITH CONTRAST TECHNIQUE: Multidetector CT imaging of the chest was performed using the standard protocol during bolus administration of intravenous contrast. Multiplanar CT image reconstructions and MIPs were obtained to evaluate the vascular anatomy. CONTRAST:  92m OMNIPAQUE IOHEXOL 350 MG/ML SOLN COMPARISON:  Chest x-ray 11/23/2019 FINDINGS: Cardiovascular: Satisfactory opacification of the pulmonary arteries to the segmental level. No evidence of pulmonary embolism. Mild aortic atherosclerosis. No aneurysmal dilatation. Coronary vascular calcification. Cardiomegaly. No pericardial effusion. Mediastinum/Nodes: Midline trachea. No thyroid mass. Mildly prominent low right  paratracheal node measuring 11 mm. Esophagus within normal limits. Lungs/Pleura: Mild right greater than left posterior pleural thickening. 4 mm right upper lobe subpleural lung nodule, series 6, image number 21. Mild subpleural fibrosis within the right middle lobe and peripheral right base. Minimal emphysema with scattered lung cysts. Upper Abdomen: No acute abnormality. Musculoskeletal: Post sternotomy changes. No acute or suspicious osseous abnormality Review of the MIP images confirms the above findings. IMPRESSION: 1. Negative for acute pulmonary embolus. 2. No acute pulmonary infiltrate. Mild subpleural fibrosis in the right middle lobe and right lung base. 3. Cardiomegaly Aortic Atherosclerosis (ICD10-I70.0) and Emphysema (ICD10-J43.9). Electronically Signed   By: KDonavan FoilM.D.   On: 11/23/2019 18:00   Microbiology: Recent Results (from the past 240 hour(s))  SARS Coronavirus 2 by RT PCR ([facility] Nasopharyngeal Nasopharyngeal Swab     Status: None   Collection Time: 11/23/19  5:54 PM   Specimen: Nasopharyngeal Swab  Result Value Ref Range Status   SARS Coronavirus 2 NEGATIVE NEGATIVE Final    Comment: (NOTE) SARS-CoV-2 target nucleic acids are NOT DETECTED. The SARS-CoV-2 RNA is generally detectable in upper and lower respiratory specimens during the acute phase of infection. The lowest concentration of SARS-CoV-2 viral copies this assay can detect is 250 copies / mL. A negative result does not preclude SARS-CoV-2 infection and should not be used as the sole basis for treatment or other patient management decisions.  A negative result may occur with improper specimen collection / handling, submission of specimen other than nasopharyngeal swab, presence of viral mutation(s) within the areas targeted by this assay, and inadequate number of viral copies (<250 copies / mL). A negative result must be combined with clinical observations,  patient history, and epidemiological information. Fact Sheet for Patients:   hStrictlyIdeas.noFact Sheet for Healthcare Providers: hBankingDealers.co.zaThis test is not yet approved or cleared  by the [place] and has been authorized for detection and/or diagnosis of SARS-CoV-2 by FDA under an Emergency Use Authorization (EUA).  This EUA will remain in effect (meaning this test can be used) for the duration of the COVID-19 declaration under Section 564(b)(1) of the Act, 21 U.S.C. section 360bbb-3(b)(1), unless the authorization is terminated or revoked sooner. [facility] [address], [place] [address]  MRSA PCR Screening     Status: Abnormal   Collection Time: 11/23/19  9:09 PM   Specimen: Nasopharyngeal  Result Value Ref Range Status   MRSA by PCR POSITIVE (A) NEGATIVE Final    Comment:        The GeneXpert MRSA Assay (FDA approved for NASAL specimens only), is one component of a comprehensive MRSA  colonization surveillance program. It is not intended to diagnose MRSA infection nor to guide or monitor treatment for MRSA infections. RESULT CALLED TO, READ BACK BY AND VERIFIED WITH: TONYA SILVA @2342  11/23/2019 TTG  Performed at [facility], [address] [place], [place] [address]    Labs: CBC: Recent Labs  Lab 11/23/19 1234 11/24/19 0544 11/25/19 0456 11/26/19 0551  WBC 7.9 7.9 7.2 8.1  HGB 12.3 11.8* 12.5 12.2  HCT 36.4 36.1 37.5 37.4  MCV 86.5 89.6 87.4 90.1  PLT 310 303 317 457   Basic Metabolic Panel: Recent Labs  Lab 11/23/19 1234 11/24/19 0544 11/25/19 0456 11/26/19 0551  NA 137 139 137 138  K 3.9 3.5 3.2* 3.8  CL 104 106 103 107  CO2 21* 22 24 23   GLUCOSE 462* 207* 173* 134*  BUN 12 17 19  23*  CREATININE 1.28* 1.35* 1.57* 1.55*  CALCIUM 8.2* 8.0* 8.1* 8.1*   Liver Function Tests: No results for input(s): AST, ALT, ALKPHOS, BILITOT, PROT,  ALBUMIN in the last 168 hours. No results for input(s): LIPASE, AMYLASE in the last 168 hours. No results for input(s): AMMONIA in the last 168 hours. Cardiac Enzymes: No results for input(s): CKTOTAL, CKMB, CKMBINDEX, TROPONINI in the last 168 hours. BNP (last 3 results) No results for input(s): BNP in the last 8760 hours. CBG: Recent Labs  Lab 11/25/19 0741 11/25/19 1131 11/25/19 1722 11/25/19 2057 11/26/19 0747  GLUCAP 197* 348* 192* 92 164*    Time spent: 35 minutes  Signed:  Berle Mull  [facility] 11/26/2019 8:46 AM

## 2019-11-29 ENCOUNTER — Other Ambulatory Visit: Payer: Self-pay

## 2019-11-29 DIAGNOSIS — Z1211 Encounter for screening for malignant neoplasm of colon: Secondary | ICD-10-CM | POA: Diagnosis not present

## 2019-11-29 NOTE — Patient Outreach (Signed)
[facility]) Care Management  11/29/2019  Terry Arroyo Aug 05, 1961 [id number]    EMMI-GENERAL DISCHARGE RED ON EMMI ALERT Day # 1 Date: 11/28/2019 Red Alert Reason: "Unfilled prescriptions? Yes Able to fill today/tomorrow? No"   Outreach attempt #1 to patient. Spoke with patient who denies any acute issues or concerns at present. Reviewed and addressed red alerts with patient. She reports that she called [employer] mail order on yesterday regarding her Hydralazine. This is a new med and she gets her meds through [facility] mail order pharmacy. They will be shipping her med to her. She states she called PCP on yesterday to advise him and was told she would be fine a few days without med. Patient had questions regarding her Novolog insulin and RN CM able to answer her question and reviewed meds with pt. She confirms she has all other meds in the home. She goes for PCP appt on 12/04/2019. Patient stats she lives with her dad and he is abel to assist her as need. She denies any issues with transportation. Patient reports she has [facility] Medicare and Medicaid and is active with their CM services. Advised patient that they would get one more automated EMMI-GENERAL post discharge calls to assess how they are doing following recent hospitalization and will receive a call from a nurse if any of their responses were abnormal. Patient voiced understanding and was appreciative of f/u call.    Plan: RN CM will close case at this time.   [name], RN,BSN,CCM [facility] Telephonic Care Management Coordinator Direct Phone: [phone] Toll Free: [phone] Fax: [phone]

## 2019-12-03 ENCOUNTER — Other Ambulatory Visit: Payer: Self-pay

## 2019-12-03 NOTE — Patient Outreach (Signed)
[facility]) Care Management  12/03/2019  Terry Arroyo November 26, 1961 [id number]    EMMI-General Discharge RED ON EMMI ALERT Day # 4 Date: 12/01/2019 Red Alert Reason: "Able to fill today/tomorrow? No"   Outreach attempt #1 to patient to follow up on red alert. RN CM spoke with patient a few days ago regarding the same red flag and was awaiting delivery of med from mail order pharmacy. Patient reports she is still awaiting arrival of one med from [facility] mail order. She states that she did call them to follow up and was told they were having some shipping/delivery delays due to pandemic and holiday. She reports she was told to expect med delivered by the end of today. Patient has PCP appt on tomorrow. She already plans to get script from MD office sent to local pharmacy to get small quantity of med if she has not received shipment of med by then. She denies any other RN CM needs or concerns at present.      Plan: RN CM will close case at this time.   [name], RN,BSN,CCM [place] [facility] Telephonic Care Management Coordinator Direct Phone: [phone] Toll Free: [phone] Fax: [phone]

## 2019-12-04 DIAGNOSIS — E1139 Type 2 diabetes mellitus with other diabetic ophthalmic complication: Secondary | ICD-10-CM | POA: Diagnosis not present

## 2019-12-04 DIAGNOSIS — R6889 Other general symptoms and signs: Secondary | ICD-10-CM | POA: Diagnosis not present

## 2019-12-04 DIAGNOSIS — E785 Hyperlipidemia, unspecified: Secondary | ICD-10-CM | POA: Diagnosis not present

## 2019-12-04 DIAGNOSIS — M791 Myalgia, unspecified site: Secondary | ICD-10-CM | POA: Insufficient documentation

## 2019-12-04 DIAGNOSIS — G8929 Other chronic pain: Secondary | ICD-10-CM | POA: Diagnosis not present

## 2019-12-04 DIAGNOSIS — N1832 Chronic kidney disease, stage 3b: Secondary | ICD-10-CM | POA: Diagnosis not present

## 2019-12-04 DIAGNOSIS — E1159 Type 2 diabetes mellitus with other circulatory complications: Secondary | ICD-10-CM | POA: Diagnosis not present

## 2019-12-04 DIAGNOSIS — N179 Acute kidney failure, unspecified: Secondary | ICD-10-CM | POA: Diagnosis not present

## 2019-12-04 DIAGNOSIS — I25118 Atherosclerotic heart disease of native coronary artery with other forms of angina pectoris: Secondary | ICD-10-CM | POA: Diagnosis not present

## 2019-12-04 DIAGNOSIS — M545 Low back pain: Secondary | ICD-10-CM | POA: Diagnosis not present

## 2019-12-04 DIAGNOSIS — E1122 Type 2 diabetes mellitus with diabetic chronic kidney disease: Secondary | ICD-10-CM | POA: Diagnosis not present

## 2019-12-21 HISTORY — PX: EYE SURGERY: SHX253

## 2020-01-08 DIAGNOSIS — I10 Essential (primary) hypertension: Secondary | ICD-10-CM | POA: Diagnosis not present

## 2020-01-08 DIAGNOSIS — G4733 Obstructive sleep apnea (adult) (pediatric): Secondary | ICD-10-CM | POA: Diagnosis not present

## 2020-01-08 DIAGNOSIS — E1159 Type 2 diabetes mellitus with other circulatory complications: Secondary | ICD-10-CM | POA: Diagnosis not present

## 2020-01-08 DIAGNOSIS — I25118 Atherosclerotic heart disease of native coronary artery with other forms of angina pectoris: Secondary | ICD-10-CM | POA: Diagnosis not present

## 2020-01-08 DIAGNOSIS — Z794 Long term (current) use of insulin: Secondary | ICD-10-CM | POA: Diagnosis not present

## 2020-01-17 DIAGNOSIS — E1165 Type 2 diabetes mellitus with hyperglycemia: Secondary | ICD-10-CM | POA: Diagnosis not present

## 2020-01-17 DIAGNOSIS — I10 Essential (primary) hypertension: Secondary | ICD-10-CM | POA: Diagnosis not present

## 2020-01-17 DIAGNOSIS — Z794 Long term (current) use of insulin: Secondary | ICD-10-CM | POA: Diagnosis not present

## 2020-01-17 DIAGNOSIS — E785 Hyperlipidemia, unspecified: Secondary | ICD-10-CM | POA: Diagnosis not present

## 2020-01-21 ENCOUNTER — Inpatient Hospital Stay
Admission: EM | Admit: 2020-01-21 | Discharge: 2020-01-23 | DRG: 638 | Disposition: A | Discharge status: Home Health | Payer: Medicare HMO | Attending: Internal Medicine | Admitting: Internal Medicine

## 2020-01-21 ENCOUNTER — Other Ambulatory Visit: Payer: Self-pay

## 2020-01-21 ENCOUNTER — Encounter: Payer: Self-pay | Admitting: Emergency Medicine

## 2020-01-21 ENCOUNTER — Emergency Department: Admission: EM | Admit: 2020-01-21 | Payer: Medicare HMO

## 2020-01-21 DIAGNOSIS — Z85841 Personal history of malignant neoplasm of brain: Secondary | ICD-10-CM

## 2020-01-21 DIAGNOSIS — I2510 Atherosclerotic heart disease of native coronary artery without angina pectoris: Secondary | ICD-10-CM | POA: Diagnosis present

## 2020-01-21 DIAGNOSIS — Z8249 Family history of ischemic heart disease and other diseases of the circulatory system: Secondary | ICD-10-CM

## 2020-01-21 DIAGNOSIS — Z9181 History of falling: Secondary | ICD-10-CM | POA: Diagnosis not present

## 2020-01-21 DIAGNOSIS — R110 Nausea: Secondary | ICD-10-CM | POA: Diagnosis not present

## 2020-01-21 DIAGNOSIS — Z87891 Personal history of nicotine dependence: Secondary | ICD-10-CM

## 2020-01-21 DIAGNOSIS — G4489 Other headache syndrome: Secondary | ICD-10-CM | POA: Diagnosis not present

## 2020-01-21 DIAGNOSIS — Z91013 Allergy to seafood: Secondary | ICD-10-CM

## 2020-01-21 DIAGNOSIS — R0789 Other chest pain: Secondary | ICD-10-CM | POA: Diagnosis not present

## 2020-01-21 DIAGNOSIS — H5462 Unqualified visual loss, left eye, normal vision right eye: Secondary | ICD-10-CM | POA: Diagnosis present

## 2020-01-21 DIAGNOSIS — Z79899 Other long term (current) drug therapy: Secondary | ICD-10-CM | POA: Diagnosis not present

## 2020-01-21 DIAGNOSIS — R079 Chest pain, unspecified: Secondary | ICD-10-CM

## 2020-01-21 DIAGNOSIS — E785 Hyperlipidemia, unspecified: Secondary | ICD-10-CM | POA: Diagnosis present

## 2020-01-21 DIAGNOSIS — E876 Hypokalemia: Secondary | ICD-10-CM | POA: Diagnosis present

## 2020-01-21 DIAGNOSIS — I160 Hypertensive urgency: Secondary | ICD-10-CM | POA: Diagnosis not present

## 2020-01-21 DIAGNOSIS — I252 Old myocardial infarction: Secondary | ICD-10-CM | POA: Diagnosis not present

## 2020-01-21 DIAGNOSIS — E871 Hypo-osmolality and hyponatremia: Secondary | ICD-10-CM | POA: Diagnosis present

## 2020-01-21 DIAGNOSIS — R739 Hyperglycemia, unspecified: Secondary | ICD-10-CM

## 2020-01-21 DIAGNOSIS — Z888 Allergy status to other drugs, medicaments and biological substances status: Secondary | ICD-10-CM

## 2020-01-21 DIAGNOSIS — I2699 Other pulmonary embolism without acute cor pulmonale: Secondary | ICD-10-CM | POA: Diagnosis not present

## 2020-01-21 DIAGNOSIS — E878 Other disorders of electrolyte and fluid balance, not elsewhere classified: Secondary | ICD-10-CM | POA: Diagnosis not present

## 2020-01-21 DIAGNOSIS — I161 Hypertensive emergency: Secondary | ICD-10-CM

## 2020-01-21 DIAGNOSIS — R112 Nausea with vomiting, unspecified: Secondary | ICD-10-CM | POA: Diagnosis not present

## 2020-01-21 DIAGNOSIS — Z87892 Personal history of anaphylaxis: Secondary | ICD-10-CM

## 2020-01-21 DIAGNOSIS — I159 Secondary hypertension, unspecified: Secondary | ICD-10-CM | POA: Diagnosis not present

## 2020-01-21 DIAGNOSIS — Z20822 Contact with and (suspected) exposure to covid-19: Secondary | ICD-10-CM | POA: Diagnosis present

## 2020-01-21 DIAGNOSIS — Z7982 Long term (current) use of aspirin: Secondary | ICD-10-CM | POA: Diagnosis not present

## 2020-01-21 DIAGNOSIS — Z951 Presence of aortocoronary bypass graft: Secondary | ICD-10-CM | POA: Diagnosis not present

## 2020-01-21 DIAGNOSIS — N390 Urinary tract infection, site not specified: Secondary | ICD-10-CM | POA: Diagnosis not present

## 2020-01-21 DIAGNOSIS — R42 Dizziness and giddiness: Secondary | ICD-10-CM | POA: Diagnosis not present

## 2020-01-21 DIAGNOSIS — Z91018 Allergy to other foods: Secondary | ICD-10-CM

## 2020-01-21 DIAGNOSIS — Z833 Family history of diabetes mellitus: Secondary | ICD-10-CM

## 2020-01-21 DIAGNOSIS — Z794 Long term (current) use of insulin: Secondary | ICD-10-CM | POA: Diagnosis not present

## 2020-01-21 DIAGNOSIS — E1165 Type 2 diabetes mellitus with hyperglycemia: Secondary | ICD-10-CM | POA: Diagnosis not present

## 2020-01-21 DIAGNOSIS — Z7951 Long term (current) use of inhaled steroids: Secondary | ICD-10-CM | POA: Diagnosis not present

## 2020-01-21 DIAGNOSIS — I10 Essential (primary) hypertension: Secondary | ICD-10-CM | POA: Diagnosis not present

## 2020-01-21 DIAGNOSIS — R9431 Abnormal electrocardiogram [ECG] [EKG]: Secondary | ICD-10-CM | POA: Diagnosis not present

## 2020-01-21 LAB — URINALYSIS, COMPLETE (UACMP) WITH MICROSCOPIC
Bilirubin Urine: NEGATIVE
Glucose, UA: >=500 mg/dL — AB
Hgb urine dipstick: NEGATIVE
Ketones, ur: NEGATIVE mg/dL
Leukocytes,Ua: NEGATIVE
Nitrite: NEGATIVE
Protein, ur: >=300 mg/dL — AB
Specific Gravity, Urine: 1.011 (ref 1.005–1.030)
pH: 7 (ref 5.0–8.0)

## 2020-01-21 LAB — CBC WITH DIFFERENTIAL/PLATELET
Abs Immature Granulocytes: 0.03 10*3/uL (ref 0.00–0.07)
Basophils Absolute: 0.1 10*3/uL (ref 0.0–0.1)
Basophils Relative: 1 %
Eosinophils Absolute: 0.2 10*3/uL (ref 0.0–0.5)
Eosinophils Relative: 2 %
HCT: 35.2 % — ABNORMAL LOW (ref 36.0–46.0)
Hemoglobin: 12.3 g/dL (ref 12.0–15.0)
Immature Granulocytes: 0 %
Lymphocytes Relative: 28 %
Lymphs Abs: 2.2 10*3/uL (ref 0.7–4.0)
MCH: 28.7 pg (ref 26.0–34.0)
MCHC: 34.9 g/dL (ref 30.0–36.0)
MCV: 82.2 fL (ref 80.0–100.0)
Monocytes Absolute: 0.6 10*3/uL (ref 0.1–1.0)
Monocytes Relative: 7 %
Neutro Abs: 4.8 10*3/uL (ref 1.7–7.7)
Neutrophils Relative %: 62 %
Platelets: 306 10*3/uL (ref 150–400)
RBC: 4.28 MIL/uL (ref 3.87–5.11)
RDW: 12.5 % (ref 11.5–15.5)
WBC: 7.8 10*3/uL (ref 4.0–10.5)
nRBC: 0 % (ref 0.0–0.2)

## 2020-01-21 LAB — COMPREHENSIVE METABOLIC PANEL
ALT: 9 U/L (ref 0–44)
AST: 21 U/L (ref 15–41)
Albumin: 2 g/dL — ABNORMAL LOW (ref 3.5–5.0)
Alkaline Phosphatase: 83 U/L (ref 38–126)
Anion gap: 11 (ref 5–15)
BUN: 10 mg/dL (ref 6–20)
CO2: 26 mmol/L (ref 22–32)
Calcium: 7.8 mg/dL — ABNORMAL LOW (ref 8.9–10.3)
Chloride: 95 mmol/L — ABNORMAL LOW (ref 98–111)
Creatinine, Ser: 1.43 mg/dL — ABNORMAL HIGH (ref 0.44–1.00)
GFR calc Af Amer: 47 mL/min — ABNORMAL LOW (ref >60)
GFR calc non Af Amer: 41 mL/min — ABNORMAL LOW (ref >60)
Glucose, Bld: 593 mg/dL (ref 70–99)
Potassium: 3 mmol/L — ABNORMAL LOW (ref 3.5–5.1)
Sodium: 132 mmol/L — ABNORMAL LOW (ref 135–145)
Total Bilirubin: 0.6 mg/dL (ref 0.3–1.2)
Total Protein: 5.6 g/dL — ABNORMAL LOW (ref 6.5–8.1)

## 2020-01-21 LAB — TROPONIN I (HIGH SENSITIVITY): Troponin I (High Sensitivity): 35 ng/L — ABNORMAL HIGH (ref <18)

## 2020-01-21 MED ORDER — AMLODIPINE BESYLATE 5 MG PO TABS
5.0000 mg | ORAL_TABLET | Freq: Once | ORAL | Status: AC
  Administered 2020-01-22: 5 mg via ORAL
  Filled 2020-01-21: qty 1

## 2020-01-21 MED ORDER — POTASSIUM CHLORIDE CRYS ER 20 MEQ PO TBCR
40.0000 meq | EXTENDED_RELEASE_TABLET | Freq: Once | ORAL | Status: AC
  Administered 2020-01-21: 40 meq via ORAL
  Filled 2020-01-21: qty 2

## 2020-01-21 MED ORDER — SODIUM CHLORIDE 0.9 % IV BOLUS
1000.0000 mL | Freq: Once | INTRAVENOUS | Status: AC
  Administered 2020-01-21: 22:00:00 1000 mL via INTRAVENOUS

## 2020-01-21 MED ORDER — METOPROLOL TARTRATE 25 MG PO TABS
25.0000 mg | ORAL_TABLET | Freq: Once | ORAL | Status: AC
  Administered 2020-01-22: 25 mg via ORAL
  Filled 2020-01-21: qty 1

## 2020-01-21 NOTE — ED Provider Notes (Signed)
[facility] Emergency Department Provider Note    ____________________________________________   I have reviewed the triage vital signs and the nursing notes.   HISTORY  Chief Complaint Chest Pain, Nausea, and Weakness   History limited by: Not Limited   HPI Terry Arroyo is a 59 y.o. female who presents to the emergency department today with multiple medical complaints.  She states that starting 2 weeks ago she developed nausea and vomiting.  This is continued.  Also 2 weeks ago she started developing some chest pain and left arm pain and weakness.  She is also noticed sore throat as well as change to her taste.  States she did have Covid back in March and that her symptoms reminded her of when she had Covid previously.  She says that she called 911 today because she became dizzy today.  She describes it as a feeling of lightheadedness. She denies any fevers.    Records reviewed. Per medical record review patient has a history of DM, HLD, HTN, MI.  Past Medical History:  Diagnosis Date  . Blind left eye   . Brain tumor ([place]) 1986  . Diabetes mellitus without complication ([place])   . Hyperlipidemia   . Hypertension   . MI (myocardial infarction) ([place]) 2018    Patient Active Problem List   Diagnosis Date Noted  . Hypertensive urgency 11/24/2019  . Chest pain 11/23/2019    Past Surgical History:  Procedure Laterality Date  . BRAIN SURGERY    . CESAREAN SECTION      Prior to Admission medications   Medication Sig Start Date End Date Taking? Authorizing Provider  amLODipine (NORVASC) 5 MG tablet Take 1 tablet (5 mg total) by mouth 2 (two) times daily. 11/26/19   Lavina Hamman, MD  aspirin EC 81 MG EC tablet Take 1 tablet (81 mg total) by mouth daily. 11/26/19   Lavina Hamman, MD  atorvastatin (LIPITOR) 80 MG tablet Take 1 tablet (80 mg total) by mouth daily at 6 PM. 11/26/19   Lavina Hamman, MD  Fluticasone-Salmeterol (ADVAIR) 250-50 MCG/DOSE  AEPB Inhale 1 puff into the lungs 2 (two) times daily.    [provider]  hydrALAZINE (APRESOLINE) 50 MG tablet Take 1 tablet (50 mg total) by mouth 3 (three) times daily. 11/26/19   Lavina Hamman, MD  insulin aspart protamine- aspart (NOVOLOG MIX 70/30) (70-30) 100 UNIT/ML injection Inject 0.4 mLs (40 Units total) into the skin 2 (two) times daily with a meal. 11/26/19   Lavina Hamman, MD  isosorbide mononitrate (IMDUR) 30 MG 24 hr tablet Take 1 tablet (30 mg total) by mouth daily. 11/26/19   Lavina Hamman, MD  methocarbamol (ROBAXIN) 500 MG tablet Take 1 tablet (500 mg total) by mouth every 8 (eight) hours as needed for muscle spasms. 11/26/19   Lavina Hamman, MD  metoprolol tartrate (LOPRESSOR) 25 MG tablet Take 1 tablet (25 mg total) by mouth 2 (two) times daily. 11/26/19   Lavina Hamman, MD  mirtazapine (REMERON) 30 MG tablet Take 1 tablet (30 mg total) by mouth at bedtime. 11/26/19   Lavina Hamman, MD  topiramate (TOPAMAX) 25 MG capsule Take 25 mg by mouth daily.    [provider]  torsemide (DEMADEX) 10 MG tablet Take 10 mg by mouth daily.    [provider]    Allergies Shellfish allergy, Kiwi extract, and Metformin  History reviewed. No pertinent family history.  Social History Social History  Tobacco Use  . Smoking status: Former Research scientist (life sciences)  . Smokeless tobacco: Never Used  Substance Use Topics  . Alcohol use: Not Currently  . Drug use: Not Currently    Review of Systems Constitutional: No fever/chills Eyes: No visual changes. ENT: Positive for sore throat.  Cardiovascular: Positive for chest pain. Respiratory: Denies shortness of breath. Gastrointestinal: Positive for nausea and vomiting.  Genitourinary: Negative for dysuria. Musculoskeletal: Positive for left arm pain. Skin: Negative for rash. Neurological: Positive for dizziness.  ____________________________________________   PHYSICAL EXAM:  VITAL SIGNS: ED Triage Vitals  Enc  Vitals Group     BP 01/21/20 2022 (!) 192/100     Pulse Rate 01/21/20 2022 71     Resp 01/21/20 2022 18     Temp 01/21/20 2022 98.8 F (37.1 C)     Temp Source 01/21/20 2022 Oral     SpO2 01/21/20 2022 95 %     Weight 01/21/20 2019 187 lb (84.8 kg)     Height 01/21/20 2019 5\' 5"  (1.651 m)     Head Circumference --      Peak Flow --      Pain Score 01/21/20 2018 6   Constitutional: Alert and oriented.  Eyes: Conjunctivae are normal.  ENT      Head: Normocephalic and atraumatic.      Nose: No congestion/rhinnorhea.      Mouth/Throat: Mucous membranes are moist.      Neck: No stridor. Hematological/Lymphatic/Immunilogical: No cervical lymphadenopathy. Cardiovascular: Normal rate, regular rhythm.  No murmurs, rubs, or gallops.  Respiratory: Normal respiratory effort without tachypnea nor retractions. Breath sounds are clear and equal bilaterally. No wheezes/rales/rhonchi. Gastrointestinal: Soft and non tender. No rebound. No guarding.  Genitourinary: Deferred Musculoskeletal: Normal range of motion in all extremities. No lower extremity edema. Neurologic:  Normal speech and language. No gross focal neurologic deficits are appreciated.  Skin:  Skin is warm, dry and intact. No rash noted. Psychiatric: Mood and affect are normal. Speech and behavior are normal. Patient exhibits appropriate insight and judgment.  ____________________________________________    LABS (pertinent positives/negatives)  CMP na 132, k 3.0, glu 593, cr 1.43 Trop hs 35 CBC wbc 7.8, hgb 12.3, plt 306  ____________________________________________   EKG  I, Nance Pear, attending physician, personally viewed and interpreted this EKG  EKG Time: 2020 Rate: 74 Rhythm: sinus rhythm Axis: normal Intervals: qtc 475 QRS: narrow, q waves V1, LVH ST changes: no st elevation Impression: abnormal ekg   ____________________________________________     RADIOLOGY  None  ____________________________________________   PROCEDURES  Procedures  ____________________________________________   INITIAL IMPRESSION / ASSESSMENT AND PLAN / ED COURSE  Pertinent labs & imaging results that were available during my care of the patient were reviewed by me and considered in my medical decision making (see chart for details).   Patient presented to the emergency department today with multiple medical complaints.  Most of them.  Have been present for the past 2 weeks.  She does state today she felt more dizziness.  Blood work is notable for elevated blood glucose level.  I do think this could cause some of the patient's symptoms so will give IV fluids.  Additionally patient was found to be hypertensive here.  She has not been able to keep her home medications down secondary to nausea so will give patient some of her home medications here to help with the blood pressure.  Do think if patient sugar improves and she clinically feels better would be reasonable  for her to be discharged home. ____________________________________________   FINAL CLINICAL IMPRESSION(S) / ED DIAGNOSES  Final diagnoses:  Hyperglycemia  Secondary hypertension  Nausea  Nonspecific chest pain     Note: This dictation was prepared with Dragon dictation. Any transcriptional errors that result from this process are unintentional     Nance Pear, MD 01/21/20 [phone]

## 2020-01-21 NOTE — ED Triage Notes (Signed)
Pt arrival via [facility] from home due to dizziness, chest pain, n/v, left arm pain, and high BP. EMS states that pt originally called out for dizziness but had the other symptoms on arrival. EMS states she threw up her medications today.  Pt has history of HTN and DM. BP 022 systolic with EMS, last BP 174/86.   2 in of nitro paste on pt's chest, 324 asa given in route.

## 2020-01-22 ENCOUNTER — Inpatient Hospital Stay: Payer: Medicare HMO

## 2020-01-22 ENCOUNTER — Encounter: Payer: Self-pay | Admitting: Family Medicine

## 2020-01-22 DIAGNOSIS — I159 Secondary hypertension, unspecified: Secondary | ICD-10-CM | POA: Diagnosis present

## 2020-01-22 DIAGNOSIS — E785 Hyperlipidemia, unspecified: Secondary | ICD-10-CM | POA: Diagnosis present

## 2020-01-22 DIAGNOSIS — Z7951 Long term (current) use of inhaled steroids: Secondary | ICD-10-CM | POA: Diagnosis not present

## 2020-01-22 DIAGNOSIS — I161 Hypertensive emergency: Secondary | ICD-10-CM | POA: Diagnosis present

## 2020-01-22 DIAGNOSIS — Z79899 Other long term (current) drug therapy: Secondary | ICD-10-CM | POA: Diagnosis not present

## 2020-01-22 DIAGNOSIS — Z87892 Personal history of anaphylaxis: Secondary | ICD-10-CM | POA: Diagnosis not present

## 2020-01-22 DIAGNOSIS — E876 Hypokalemia: Secondary | ICD-10-CM

## 2020-01-22 DIAGNOSIS — I160 Hypertensive urgency: Secondary | ICD-10-CM

## 2020-01-22 DIAGNOSIS — E1165 Type 2 diabetes mellitus with hyperglycemia: Principal | ICD-10-CM

## 2020-01-22 DIAGNOSIS — N390 Urinary tract infection, site not specified: Secondary | ICD-10-CM | POA: Diagnosis present

## 2020-01-22 DIAGNOSIS — Z91013 Allergy to seafood: Secondary | ICD-10-CM | POA: Diagnosis not present

## 2020-01-22 DIAGNOSIS — R110 Nausea: Secondary | ICD-10-CM | POA: Diagnosis present

## 2020-01-22 DIAGNOSIS — I2510 Atherosclerotic heart disease of native coronary artery without angina pectoris: Secondary | ICD-10-CM | POA: Diagnosis present

## 2020-01-22 DIAGNOSIS — E871 Hypo-osmolality and hyponatremia: Secondary | ICD-10-CM | POA: Diagnosis present

## 2020-01-22 DIAGNOSIS — E878 Other disorders of electrolyte and fluid balance, not elsewhere classified: Secondary | ICD-10-CM | POA: Diagnosis present

## 2020-01-22 DIAGNOSIS — Z794 Long term (current) use of insulin: Secondary | ICD-10-CM | POA: Diagnosis not present

## 2020-01-22 DIAGNOSIS — Z20822 Contact with and (suspected) exposure to covid-19: Secondary | ICD-10-CM | POA: Diagnosis present

## 2020-01-22 DIAGNOSIS — Z951 Presence of aortocoronary bypass graft: Secondary | ICD-10-CM | POA: Diagnosis not present

## 2020-01-22 DIAGNOSIS — Z9181 History of falling: Secondary | ICD-10-CM | POA: Diagnosis not present

## 2020-01-22 DIAGNOSIS — Z888 Allergy status to other drugs, medicaments and biological substances status: Secondary | ICD-10-CM | POA: Diagnosis not present

## 2020-01-22 DIAGNOSIS — Z87891 Personal history of nicotine dependence: Secondary | ICD-10-CM | POA: Diagnosis not present

## 2020-01-22 DIAGNOSIS — R739 Hyperglycemia, unspecified: Secondary | ICD-10-CM | POA: Diagnosis not present

## 2020-01-22 DIAGNOSIS — I252 Old myocardial infarction: Secondary | ICD-10-CM | POA: Diagnosis not present

## 2020-01-22 DIAGNOSIS — Z85841 Personal history of malignant neoplasm of brain: Secondary | ICD-10-CM | POA: Diagnosis not present

## 2020-01-22 DIAGNOSIS — Z7982 Long term (current) use of aspirin: Secondary | ICD-10-CM | POA: Diagnosis not present

## 2020-01-22 DIAGNOSIS — R079 Chest pain, unspecified: Secondary | ICD-10-CM

## 2020-01-22 DIAGNOSIS — H5462 Unqualified visual loss, left eye, normal vision right eye: Secondary | ICD-10-CM | POA: Diagnosis present

## 2020-01-22 LAB — NM MYOCAR MULTI W/SPECT W/WALL MOTION / EF
Estimated workload: 1 METS
Exercise duration (min): 1 min
LV dias vol: 129 mL (ref 46–106)
LV sys vol: 89 mL
Peak HR: 86 {beats}/min
Percent HR: 52 %
Rest HR: 70 {beats}/min
SDS: 5
SRS: 20
SSS: 25
TID: 1.1

## 2020-01-22 LAB — GLUCOSE, CAPILLARY
Glucose-Capillary: 424 mg/dL — ABNORMAL HIGH (ref 70–99)
Glucose-Capillary: 356 mg/dL — ABNORMAL HIGH (ref 70–99)
Glucose-Capillary: 315 mg/dL — ABNORMAL HIGH (ref 70–99)
Glucose-Capillary: 278 mg/dL — ABNORMAL HIGH (ref 70–99)
Glucose-Capillary: 161 mg/dL — ABNORMAL HIGH (ref 70–99)
Glucose-Capillary: 55 mg/dL — ABNORMAL LOW (ref 70–99)
Glucose-Capillary: 97 mg/dL (ref 70–99)
Glucose-Capillary: 105 mg/dL — ABNORMAL HIGH (ref 70–99)
Glucose-Capillary: 125 mg/dL — ABNORMAL HIGH (ref 70–99)

## 2020-01-22 LAB — BASIC METABOLIC PANEL
Anion gap: 11 (ref 5–15)
BUN: 10 mg/dL (ref 6–20)
CO2: 26 mmol/L (ref 22–32)
Calcium: 7.9 mg/dL — ABNORMAL LOW (ref 8.9–10.3)
Chloride: 103 mmol/L (ref 98–111)
Creatinine, Ser: 1.37 mg/dL — ABNORMAL HIGH (ref 0.44–1.00)
GFR calc Af Amer: 49 mL/min — ABNORMAL LOW (ref >60)
GFR calc non Af Amer: 43 mL/min — ABNORMAL LOW (ref >60)
Glucose, Bld: 333 mg/dL — ABNORMAL HIGH (ref 70–99)
Potassium: 3.4 mmol/L — ABNORMAL LOW (ref 3.5–5.1)
Sodium: 140 mmol/L (ref 135–145)

## 2020-01-22 LAB — LIPID PANEL
Cholesterol: 290 mg/dL — ABNORMAL HIGH (ref 0–200)
HDL: 46 mg/dL (ref >40)
LDL Cholesterol: UNDETERMINED mg/dL (ref 0–99)
Total CHOL/HDL Ratio: 6.3 RATIO
Triglycerides: 453 mg/dL — ABNORMAL HIGH (ref <150)
VLDL: UNDETERMINED mg/dL (ref 0–40)

## 2020-01-22 LAB — CBC
HCT: 36.7 % (ref 36.0–46.0)
Hemoglobin: 12.4 g/dL (ref 12.0–15.0)
MCH: 28.4 pg (ref 26.0–34.0)
MCHC: 33.8 g/dL (ref 30.0–36.0)
MCV: 84 fL (ref 80.0–100.0)
Platelets: 323 10*3/uL (ref 150–400)
RBC: 4.37 MIL/uL (ref 3.87–5.11)
RDW: 12.5 % (ref 11.5–15.5)
WBC: 8.1 10*3/uL (ref 4.0–10.5)
nRBC: 0 % (ref 0.0–0.2)

## 2020-01-22 LAB — MAGNESIUM: Magnesium: 1.7 mg/dL (ref 1.7–2.4)

## 2020-01-22 LAB — TROPONIN I (HIGH SENSITIVITY)
Troponin I (High Sensitivity): 39 ng/L — ABNORMAL HIGH (ref <18)
Troponin I (High Sensitivity): 43 ng/L — ABNORMAL HIGH (ref <18)

## 2020-01-22 LAB — HEMOGLOBIN A1C
Hgb A1c MFr Bld: 14.1 % — ABNORMAL HIGH (ref 4.8–5.6)
Mean Plasma Glucose: 357.97 mg/dL

## 2020-01-22 LAB — LDL CHOLESTEROL, DIRECT: Direct LDL: 145.2 mg/dL — ABNORMAL HIGH (ref 0–99)

## 2020-01-22 LAB — SARS CORONAVIRUS 2 (TAT 6-24 HRS): SARS Coronavirus 2: NEGATIVE

## 2020-01-22 LAB — FIBRIN DERIVATIVES D-DIMER (ARMC ONLY): Fibrin derivatives D-dimer (ARMC): 1686.18 ng/mL (FEU) — ABNORMAL HIGH (ref 0.00–499.00)

## 2020-01-22 IMAGING — CT CT ANGIO CHEST
2 of 6 series · 18 of 46 positions shown · IV contrast (APPLIED)
Comparison: [DATE].

CLINICAL DATA: Pulmonary embolism. Chest pain and dizziness.

EXAM:
CT ANGIOGRAPHY CHEST WITH CONTRAST
TECHNIQUE: Multidetector CT imaging of the chest was performed using the
standard protocol during bolus administration of intravenous
contrast. Multiplanar CT image reconstructions and MIPs were
obtained to evaluate the vascular anatomy.
CONTRAST:  75mL OMNIPAQUE IOHEXOL 350 MG/ML SOLN

[Series 5: thins · axial · 0.61mm/px · z∈[-560,-351]mm · 15 of 229 slices shown]
[im 10/229  lung]
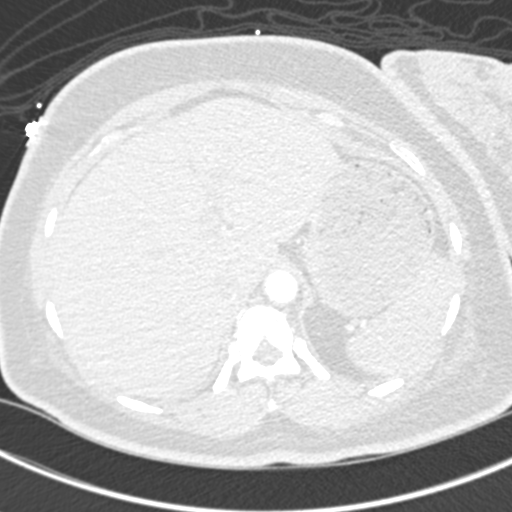
[im 30/229  soft-tissue]
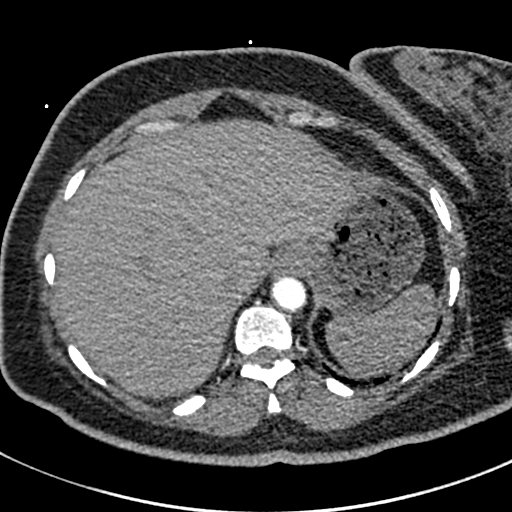
[im 40/229  lung]
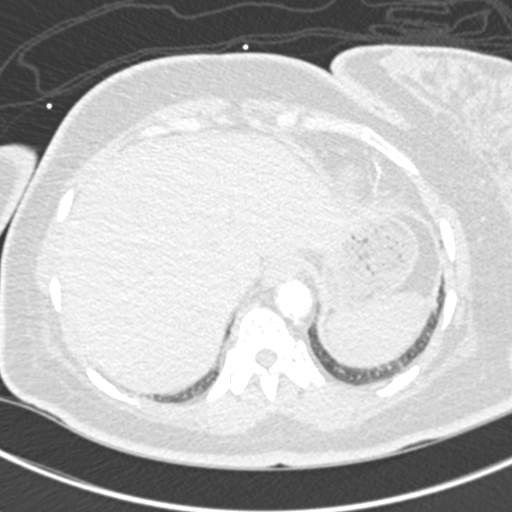
[im 60/229  soft-tissue]
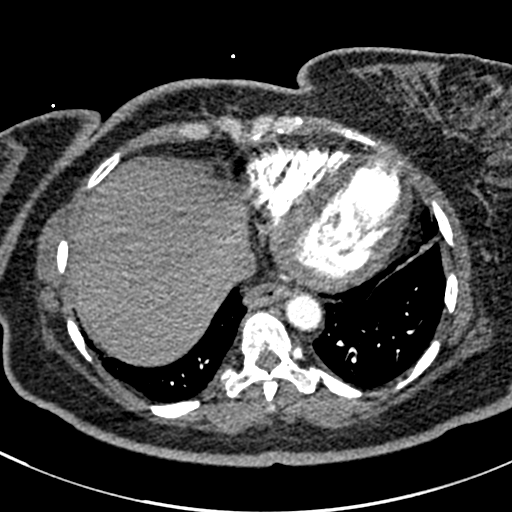
[im 70/229  lung]
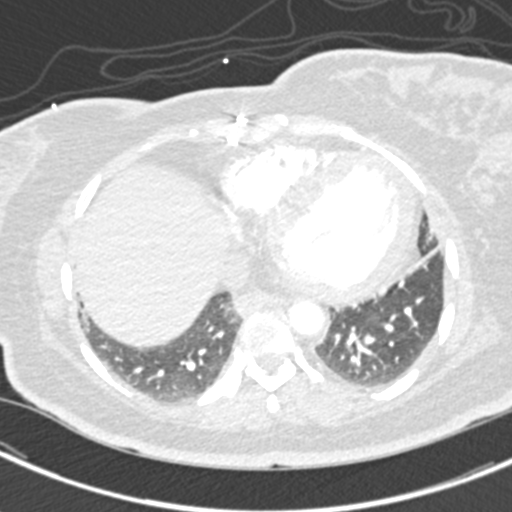
[im 90/229  soft-tissue]
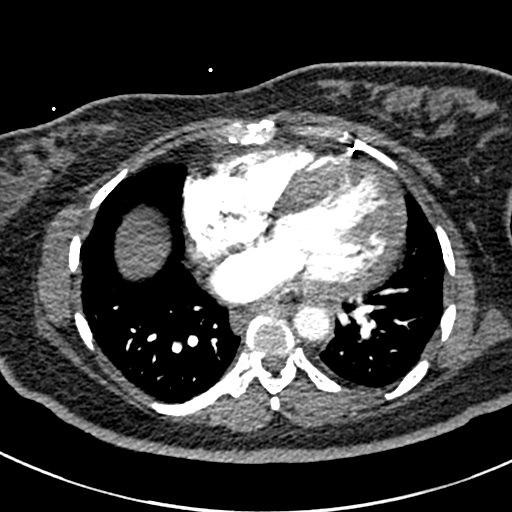
[im 100/229  lung]
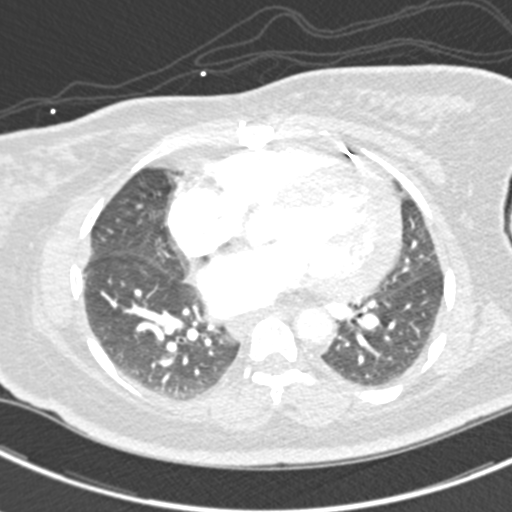
[im 119/229  soft-tissue]
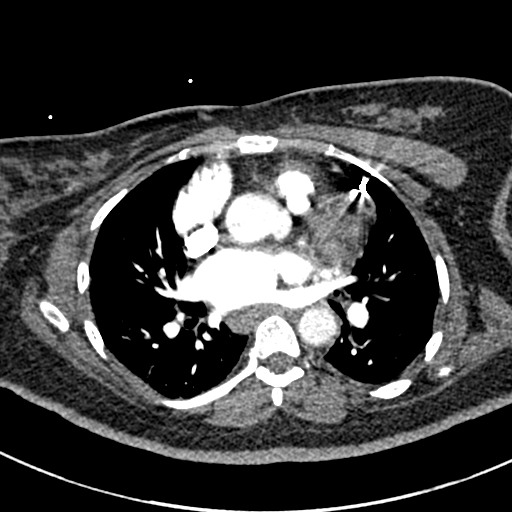
[im 129/229  lung]
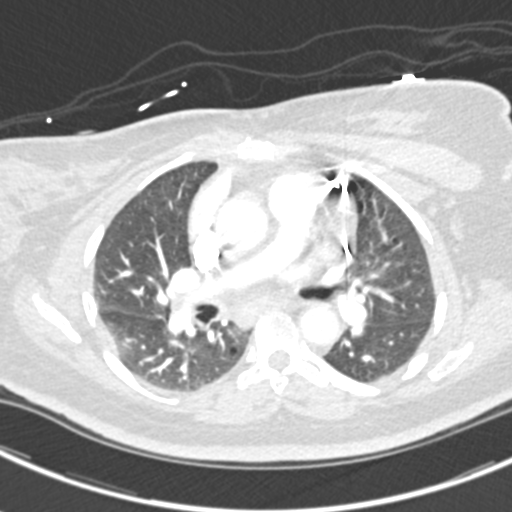
[im 139/229  soft-tissue]
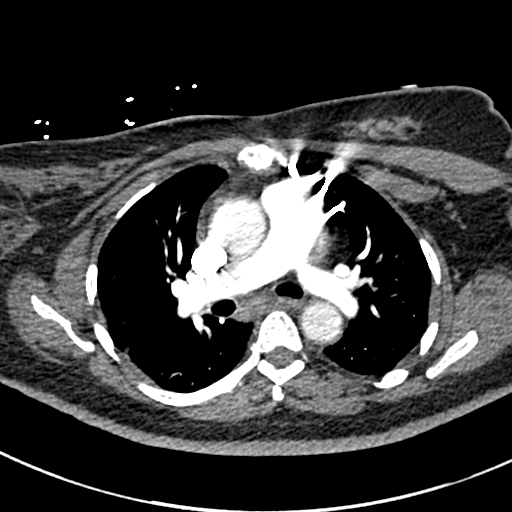
[im 159/229  lung]
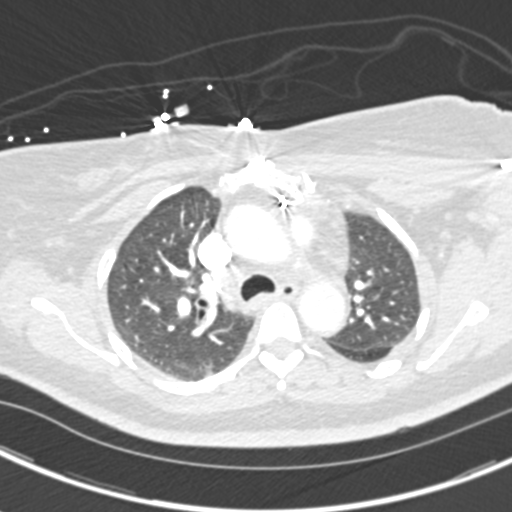
[im 169/229  soft-tissue]
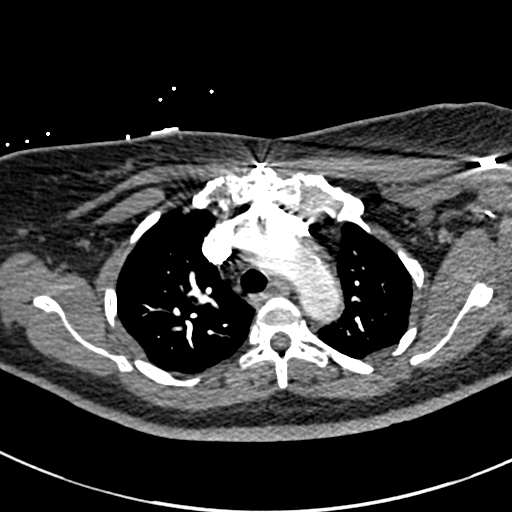
[im 189/229  lung]
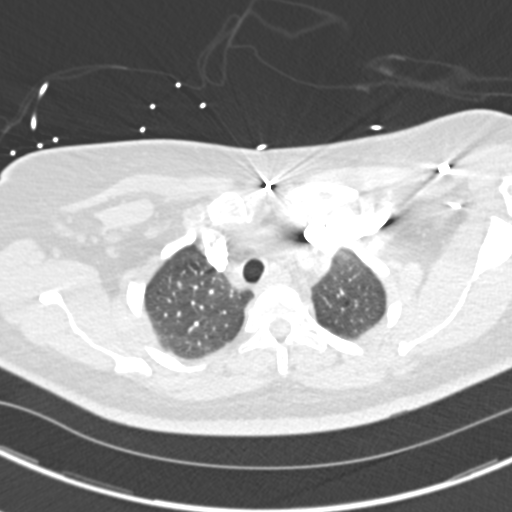
[im 199/229  soft-tissue]
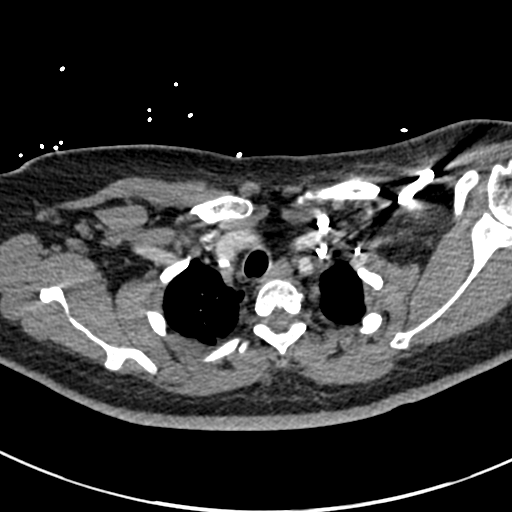
[im 219/229  lung]
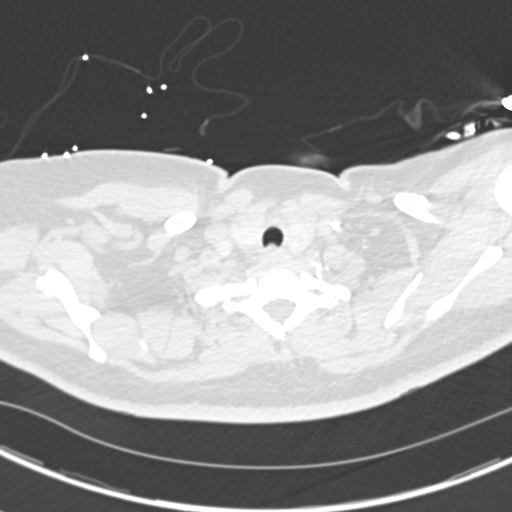

[Series 7: coronal mpr · coronal · 0.49mm/px · 3 of 75 slices shown]
[im 19/75  soft-tissue]
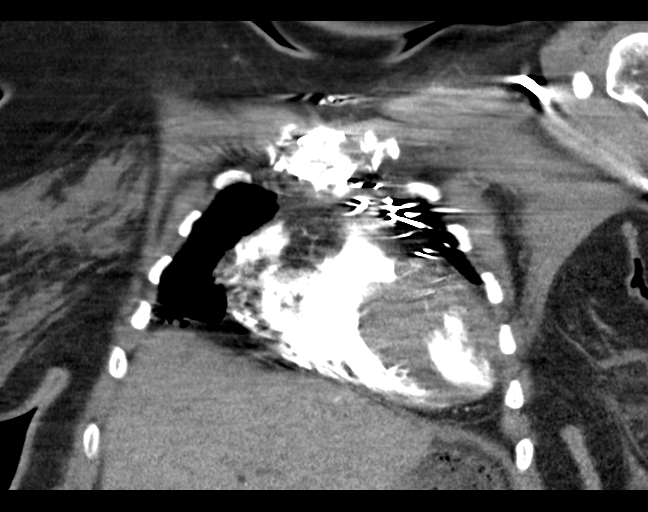
[im 38/75  soft-tissue]
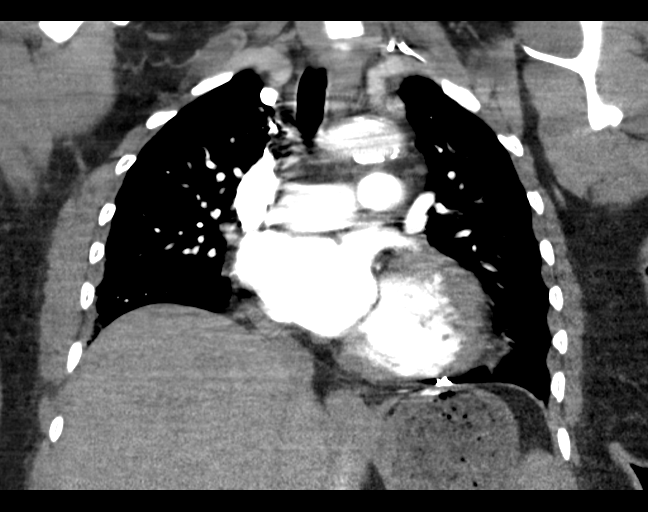
[im 56/75  soft-tissue]
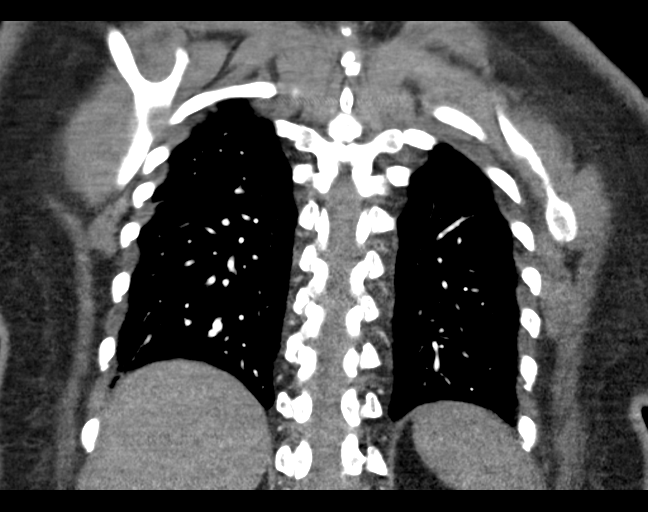

[18 of 46 positions shown; findings below may reference images not displayed]

FINDINGS: Cardiovascular: Contrast injection is sufficient to demonstrate
satisfactory opacification of the pulmonary arteries to the
segmental level. There is no pulmonary embolus. The main pulmonary
artery is within normal limits for size. There is no CT evidence of
acute right heart strain. There are mild atherosclerotic changes of
the visualized thoracic aorta. Heart size is enlarged. The patient
is status post prior median sternotomy and CABG. There is thickening
of the left ventricular wall.

Mediastinum/Nodes:

--No mediastinal or hilar lymphadenopathy.

--No axillary lymphadenopathy.

--No supraclavicular lymphadenopathy.

--Normal thyroid gland.

--The esophagus is unremarkable

Lungs/Pleura: No pulmonary nodules or masses. No pleural effusion or
pneumothorax. No focal airspace consolidation. No focal pleural
abnormality.

Upper Abdomen: No acute abnormality.

Musculoskeletal: No chest wall abnormality. No acute or significant
osseous findings.

Review of the MIP images confirms the above findings.
IMPRESSION: 1. No evidence of acute pulmonary embolism.
2. Cardiomegaly with left ventricular wall thickening.

Aortic Atherosclerosis ([FB]-[FB]).

## 2020-01-22 MED ORDER — NITROGLYCERIN IN D5W 200-5 MCG/ML-% IV SOLN: 0.0000 ug/min | INTRAVENOUS | Status: DC

## 2020-01-22 MED ORDER — MORPHINE SULFATE (PF) 2 MG/ML IV SOLN
2.0000 mg | INTRAVENOUS | Status: DC | PRN
  Administered 2020-01-22: 4 mg via INTRAVENOUS
  Administered 2020-01-22 – 2020-01-23 (×2): 2 mg via INTRAVENOUS
  Filled 2020-01-22: qty 1
  Filled 2020-01-22: qty 2
  Filled 2020-01-22 (×2): qty 1

## 2020-01-22 MED ORDER — CYCLOBENZAPRINE HCL 10 MG PO TABS
10.0000 mg | ORAL_TABLET | Freq: Once | ORAL | Status: AC
  Administered 2020-01-22: 10 mg via ORAL
  Filled 2020-01-22: qty 1

## 2020-01-22 MED ORDER — ATORVASTATIN CALCIUM 20 MG PO TABS
80.0000 mg | ORAL_TABLET | Freq: Every day | ORAL | Status: DC
  Administered 2020-01-22: 80 mg via ORAL
  Filled 2020-01-22: qty 4

## 2020-01-22 MED ORDER — SODIUM CHLORIDE 0.9% FLUSH: 10.0000 mL | INTRAVENOUS | Status: DC | PRN

## 2020-01-22 MED ORDER — INSULIN ASPART 100 UNIT/ML ~~LOC~~ SOLN
10.0000 [IU] | Freq: Once | SUBCUTANEOUS | Status: AC
  Administered 2020-01-22: 02:00:00 10 [IU] via INTRAVENOUS
  Filled 2020-01-22: qty 1

## 2020-01-22 MED ORDER — SODIUM CHLORIDE 0.9 % IV SOLN
INTRAVENOUS | Status: DC | PRN
  Administered 2020-01-22 – 2020-01-23 (×2): 250 mL via INTRAVENOUS

## 2020-01-22 MED ORDER — ENOXAPARIN SODIUM 40 MG/0.4ML ~~LOC~~ SOLN
40.0000 mg | SUBCUTANEOUS | Status: DC
  Administered 2020-01-22 – 2020-01-23 (×2): 40 mg via SUBCUTANEOUS
  Filled 2020-01-22 (×2): qty 0.4

## 2020-01-22 MED ORDER — ASPIRIN EC 81 MG PO TBEC: 81.0000 mg | DELAYED_RELEASE_TABLET | Freq: Every day | ORAL | Status: DC

## 2020-01-22 MED ORDER — INSULIN ASPART 100 UNIT/ML ~~LOC~~ SOLN
0.0000 [IU] | SUBCUTANEOUS | Status: DC
  Administered 2020-01-22: 20 [IU] via SUBCUTANEOUS
  Administered 2020-01-22: 07:00:00 11 [IU] via SUBCUTANEOUS
  Filled 2020-01-22 (×2): qty 1

## 2020-01-22 MED ORDER — INSULIN ASPART 100 UNIT/ML ~~LOC~~ SOLN: 0.0000 [IU] | SUBCUTANEOUS | Status: DC

## 2020-01-22 MED ORDER — SODIUM CHLORIDE 0.9% FLUSH
10.0000 mL | Freq: Two times a day (BID) | INTRAVENOUS | Status: DC
  Administered 2020-01-22: 10 mL

## 2020-01-22 MED ORDER — REGADENOSON 0.4 MG/5ML IV SOLN
0.4000 mg | Freq: Once | INTRAVENOUS | Status: AC
  Administered 2020-01-22: 0.4 mg via INTRAVENOUS

## 2020-01-22 MED ORDER — TORSEMIDE 10 MG PO TABS
10.0000 mg | ORAL_TABLET | Freq: Every day | ORAL | Status: DC
  Filled 2020-01-22: qty 1

## 2020-01-22 MED ORDER — ACETAMINOPHEN 325 MG PO TABS
650.0000 mg | ORAL_TABLET | ORAL | Status: DC | PRN
  Administered 2020-01-22: 04:00:00 650 mg via ORAL
  Filled 2020-01-22: qty 2

## 2020-01-22 MED ORDER — ASPIRIN EC 81 MG PO TBEC
81.0000 mg | DELAYED_RELEASE_TABLET | Freq: Every day | ORAL | Status: DC
  Administered 2020-01-22 – 2020-01-23 (×2): 81 mg via ORAL
  Filled 2020-01-22 (×2): qty 1

## 2020-01-22 MED ORDER — METHOCARBAMOL 500 MG PO TABS
500.0000 mg | ORAL_TABLET | Freq: Three times a day (TID) | ORAL | Status: DC | PRN
  Filled 2020-01-22: qty 1

## 2020-01-22 MED ORDER — MORPHINE SULFATE (PF) 2 MG/ML IV SOLN
2.0000 mg | INTRAVENOUS | Status: DC | PRN
  Administered 2020-01-22: 2 mg via INTRAVENOUS
  Filled 2020-01-22: qty 1

## 2020-01-22 MED ORDER — NITROGLYCERIN 0.4 MG SL SUBL: 0.4000 mg | SUBLINGUAL_TABLET | SUBLINGUAL | Status: DC | PRN

## 2020-01-22 MED ORDER — HYDRALAZINE HCL 50 MG PO TABS
50.0000 mg | ORAL_TABLET | Freq: Three times a day (TID) | ORAL | Status: DC
  Administered 2020-01-22 – 2020-01-23 (×4): 50 mg via ORAL
  Filled 2020-01-22 (×4): qty 1

## 2020-01-22 MED ORDER — POTASSIUM CHLORIDE 20 MEQ PO PACK
40.0000 meq | PACK | Freq: Once | ORAL | Status: AC
  Administered 2020-01-22: 40 meq via ORAL
  Filled 2020-01-22: qty 2

## 2020-01-22 MED ORDER — ISOSORBIDE MONONITRATE ER 60 MG PO TB24
60.0000 mg | ORAL_TABLET | Freq: Every day | ORAL | Status: DC
  Administered 2020-01-23: 60 mg via ORAL
  Filled 2020-01-22: qty 1

## 2020-01-22 MED ORDER — NITROGLYCERIN IN D5W 200-5 MCG/ML-% IV SOLN
0.0000 ug/min | INTRAVENOUS | Status: DC
  Administered 2020-01-22: 20 ug/min via INTRAVENOUS
  Administered 2020-01-22: 30 ug/min via INTRAVENOUS
  Administered 2020-01-22: 25 ug/min via INTRAVENOUS
  Administered 2020-01-22: 02:00:00 5 ug/min via INTRAVENOUS
  Filled 2020-01-22: qty 250

## 2020-01-22 MED ORDER — INSULIN ASPART 100 UNIT/ML ~~LOC~~ SOLN
0.0000 [IU] | SUBCUTANEOUS | Status: DC
  Administered 2020-01-22 – 2020-01-23 (×2): 3 [IU] via SUBCUTANEOUS
  Administered 2020-01-23: 4 [IU] via SUBCUTANEOUS
  Administered 2020-01-23: 3 [IU] via SUBCUTANEOUS
  Filled 2020-01-22 (×4): qty 1

## 2020-01-22 MED ORDER — SODIUM CHLORIDE 0.9 % IV SOLN: INTRAVENOUS | Status: DC

## 2020-01-22 MED ORDER — ALPRAZOLAM 0.25 MG PO TABS
0.2500 mg | ORAL_TABLET | Freq: Two times a day (BID) | ORAL | Status: DC | PRN
  Filled 2020-01-22: qty 1

## 2020-01-22 MED ORDER — METOPROLOL TARTRATE 25 MG PO TABS: 25.0000 mg | ORAL_TABLET | Freq: Two times a day (BID) | ORAL | Status: DC

## 2020-01-22 MED ORDER — METOPROLOL TARTRATE 25 MG PO TABS
25.0000 mg | ORAL_TABLET | Freq: Two times a day (BID) | ORAL | Status: DC
  Administered 2020-01-22 – 2020-01-23 (×2): 25 mg via ORAL
  Filled 2020-01-22 (×3): qty 1

## 2020-01-22 MED ORDER — ZOLPIDEM TARTRATE 5 MG PO TABS: 5.0000 mg | ORAL_TABLET | Freq: Every evening | ORAL | Status: DC | PRN

## 2020-01-22 MED ORDER — TECHNETIUM TC 99M TETROFOSMIN IV KIT
30.0000 | PACK | Freq: Once | INTRAVENOUS | Status: AC | PRN
  Administered 2020-01-22: 30.84 via INTRAVENOUS

## 2020-01-22 MED ORDER — ISOSORBIDE MONONITRATE ER 60 MG PO TB24
30.0000 mg | ORAL_TABLET | Freq: Every day | ORAL | Status: DC
  Administered 2020-01-22: 30 mg via ORAL
  Filled 2020-01-22: qty 1

## 2020-01-22 MED ORDER — LIDOCAINE VISCOUS HCL 2 % MT SOLN
15.0000 mL | Freq: Once | OROMUCOSAL | Status: DC
  Filled 2020-01-22 (×2): qty 15

## 2020-01-22 MED ORDER — INSULIN ASPART PROT & ASPART (70-30 MIX) 100 UNIT/ML ~~LOC~~ SUSP
40.0000 [IU] | Freq: Two times a day (BID) | SUBCUTANEOUS | Status: DC
  Administered 2020-01-22 – 2020-01-23 (×3): 40 [IU] via SUBCUTANEOUS
  Filled 2020-01-22 (×3): qty 10

## 2020-01-22 MED ORDER — SODIUM CHLORIDE 0.9 % IV SOLN
1.0000 g | INTRAVENOUS | Status: DC
  Administered 2020-01-22 – 2020-01-23 (×2): 1 g via INTRAVENOUS
  Filled 2020-01-22 (×2): qty 10
  Filled 2020-01-22: qty 1

## 2020-01-22 MED ORDER — ONDANSETRON HCL 4 MG/2ML IJ SOLN
4.0000 mg | Freq: Four times a day (QID) | INTRAMUSCULAR | Status: DC | PRN
  Administered 2020-01-22: 18:00:00 4 mg via INTRAVENOUS
  Filled 2020-01-22: qty 2

## 2020-01-22 MED ORDER — IOHEXOL 350 MG/ML SOLN
75.0000 mL | Freq: Once | INTRAVENOUS | Status: AC | PRN
  Administered 2020-01-22: 17:00:00 75 mL via INTRAVENOUS

## 2020-01-22 MED ORDER — ASPIRIN 81 MG PO CHEW: 324.0000 mg | CHEWABLE_TABLET | Freq: Once | ORAL | Status: DC

## 2020-01-22 MED ORDER — ALUM & MAG HYDROXIDE-SIMETH 200-200-20 MG/5ML PO SUSP
30.0000 mL | Freq: Once | ORAL | Status: DC
  Filled 2020-01-22: qty 30

## 2020-01-22 MED ORDER — TECHNETIUM TC 99M TETROFOSMIN IV KIT
10.0000 | PACK | Freq: Once | INTRAVENOUS | Status: AC | PRN
  Administered 2020-01-22: 11:00:00 9.81 via INTRAVENOUS

## 2020-01-22 MED ORDER — TOPIRAMATE 25 MG PO TABS
25.0000 mg | ORAL_TABLET | Freq: Every day | ORAL | Status: DC
  Administered 2020-01-22 – 2020-01-23 (×2): 25 mg via ORAL
  Filled 2020-01-22 (×2): qty 1

## 2020-01-22 MED ORDER — AMLODIPINE BESYLATE 5 MG PO TABS
5.0000 mg | ORAL_TABLET | Freq: Two times a day (BID) | ORAL | Status: DC
  Administered 2020-01-22 – 2020-01-23 (×3): 5 mg via ORAL
  Filled 2020-01-22 (×3): qty 1

## 2020-01-22 MED ORDER — INFLUENZA VAC SPLIT QUAD 0.5 ML IM SUSY: 0.5000 mL | PREFILLED_SYRINGE | INTRAMUSCULAR | Status: DC

## 2020-01-22 MED ORDER — MIRTAZAPINE 15 MG PO TABS
30.0000 mg | ORAL_TABLET | Freq: Every day | ORAL | Status: DC
  Administered 2020-01-22: 30 mg via ORAL
  Filled 2020-01-22: qty 2

## 2020-01-22 NOTE — Plan of Care (Signed)
  Problem: Education: Goal: Knowledge of General Education information will improve Description: Including pain rating scale, medication(s)/side effects and non-pharmacologic comfort measures Outcome: Progressing Note: Patient profile completed. Patient is currently on a nitroglycerin drip. Patient is complaining of a headache.

## 2020-01-22 NOTE — Consult Note (Signed)
CARDIOLOGY CONSULT NOTE               Patient ID: Terry Arroyo MRN: [id number] DOB/AGE: 1961/10/19 59 y.o.  Admit date: 01/21/2020 Referring Physician: Lavina Hamman, MD  Primary Physician: Kirk Ruths, MD  Primary Cardiologist: Flossie Dibble, MD  Reason for Consultation: Chest Pain   HPI: Terry Arroyo is a 59 year old African-American female with a PMH significant for CAD s/p CABGx4 (2018, performed in [place]), hypertension, dyslipidemia, DM type II, brain tumor s/p resection with residual left eye blindness who presented to [facility] ED with hypertensive urgency with an initial BP=190/104, hyperglycemia with an initial CBG=593 and chest pain.   During her ED course her labs revealed a troponin = 35, creatinine of 1.43 and multiple electrolyte imbalances including hypokalemia, hyponatremia and hypochloremia. Her EKG was negative for any ischemic changes and her second troponin=43. The patient was given 325 of aspirin, amlodipine, flexeril, 40 mEq of potassium orally, 1 L bolus of IV normal saline, 10 units of IV regular insulin and was started on a nitroglycerin IV drip and admitted to the Telemetry unit.   Upon consult, the patient c/o right low flank pain, nausea and vomiting. She reports that she has been unable to take any of her home medications for the past couple days due to the symptoms.  She reports that she has left chest discomfort that she describes as a "warm sensation" that radiates to her left arm and associated weakness of the left arm. The patient states that the discomfort does not feel the same as the pain she felt during her previous MI. The patient admits that she has been experiencing some dizziness at home and suffered a fall in her bathroom 4 days ago. She denies hitting her head or any trauma to her neck, back or extremities. The patient denies all tobacco and ilicit drug use. She also reports that she was diagnosed with COVID-19 back in December and that she  feels as if these are the same symptoms that she experienced during that time. Her COVID-19 results are currently pending.    Review of systems complete and found to be negative unless listed above    Past Medical History:  Diagnosis Date  . Blind left eye   . Brain tumor ([place]) 1986  . Diabetes mellitus without complication ([place])   . Hyperlipidemia   . Hypertension   . MI (myocardial infarction) ([place]) 2018    Past Surgical History:  Procedure Laterality Date  . BRAIN SURGERY    . CESAREAN SECTION      Medications Prior to Admission  Medication Sig Dispense Refill Last Dose  . amLODipine (NORVASC) 5 MG tablet Take 1 tablet (5 mg total) by mouth 2 (two) times daily. 60 tablet 0 Unknown at Unknown  . aspirin EC 81 MG EC tablet Take 1 tablet (81 mg total) by mouth daily. 30 tablet 0 Unknown at Unknown  . atorvastatin (LIPITOR) 80 MG tablet Take 1 tablet (80 mg total) by mouth daily at 6 PM. 30 tablet 0 Unknown at Unknown  . Fluticasone-Salmeterol (ADVAIR) 250-50 MCG/DOSE AEPB Inhale 1 puff into the lungs 2 (two) times daily.   Unknown at Unknown  . hydrALAZINE (APRESOLINE) 50 MG tablet Take 1 tablet (50 mg total) by mouth 3 (three) times daily. 90 tablet 0 Unknown at Unknown  . insulin aspart protamine- aspart (NOVOLOG MIX 70/30) (70-30) 100 UNIT/ML injection Inject 0.4 mLs (40 Units total) into the skin 2 (two) times daily  with a meal. 10 mL 0 Unknown at Unknown  . methocarbamol (ROBAXIN) 500 MG tablet Take 1 tablet (500 mg total) by mouth every 8 (eight) hours as needed for muscle spasms. 15 tablet 0 prn at prn  . metoprolol tartrate (LOPRESSOR) 25 MG tablet Take 1 tablet (25 mg total) by mouth 2 (two) times daily. 60 tablet 0 Unknown at Unknown  . mirtazapine (REMERON) 30 MG tablet Take 1 tablet (30 mg total) by mouth at bedtime. 30 tablet 0 Unknown at Unknown  . topiramate (TOPAMAX) 25 MG capsule Take 25 mg by mouth daily.   Unknown at Unknown  . torsemide (DEMADEX) 10 MG tablet Take  10 mg by mouth daily.   Unknown at Unknown  . valACYclovir (VALTREX) 500 MG tablet Take 1 tablet by mouth 2 (two) times daily.   Unknown at Unknown  . isosorbide mononitrate (IMDUR) 30 MG 24 hr tablet Take 1 tablet (30 mg total) by mouth daily. (Patient not taking: Reported on 01/22/2020) 30 tablet 0 Not Taking at Unknown time   Social History   Socioeconomic History  . Marital status: Single    Spouse name: Not on file  . Number of children: Not on file  . Years of education: Not on file  . Highest education level: Not on file  Occupational History  . Not on file  Tobacco Use  . Smoking status: Former Research scientist (life sciences)  . Smokeless tobacco: Never Used  Substance and Sexual Activity  . Alcohol use: Not Currently  . Drug use: Not Currently  . Sexual activity: Not on file  Other Topics Concern  . Not on file  Social History Narrative  . Not on file   Social Determinants of Health   Financial Resource Strain:   . Difficulty of Paying Living Expenses: Not on file  Food Insecurity:   . Worried About Charity fundraiser in the Last Year: Not on file  . Ran Out of Food in the Last Year: Not on file  Transportation Needs:   . Lack of Transportation (Medical): Not on file  . Lack of Transportation (Non-Medical): Not on file  Physical Activity:   . Days of Exercise per Week: Not on file  . Minutes of Exercise per Session: Not on file  Stress:   . Feeling of Stress : Not on file  Social Connections:   . Frequency of Communication with Friends and Family: Not on file  . Frequency of Social Gatherings with Friends and Family: Not on file  . Attends Religious Services: Not on file  . Active Member of Clubs or Organizations: Not on file  . Attends Archivist Meetings: Not on file  . Marital Status: Not on file  Intimate Partner Violence:   . Fear of Current or Ex-Partner: Not on file  . Emotionally Abused: Not on file  . Physically Abused: Not on file  . Sexually Abused: Not on file     History reviewed. No pertinent family history.    Review of systems complete and found to be negative unless listed above      PHYSICAL EXAM  General: appears disheveled, well nourished, in no acute distress HEENT:  Normocephalic and atramatic, left eye blindness  Neck:  No JVD.  Lungs: Clear bilaterally to auscultation Heart: HRRR . Normal S1 and S2 without gallops or murmurs.  Abdomen: Bowel sounds are positive, abdomen soft and non-tender  Msk:  Back normal, abnormal gait. Normal strength and tone for age. Extremities: No clubbing,  cyanosis or edema.   Neuro: Alert and oriented X 3. Psych: flat affect, responds appropriately  Labs:   Lab Results  Component Value Date   WBC 8.1 01/22/2020   HGB 12.4 01/22/2020   HCT 36.7 01/22/2020   MCV 84.0 01/22/2020   PLT 323 01/22/2020    Recent Labs  Lab 01/21/20 2032 01/21/20 2032 01/22/20 0607  NA 132*   < > 140  K 3.0*   < > 3.4*  CL 95*   < > 103  CO2 26   < > 26  BUN 10   < > 10  CREATININE 1.43*   < > 1.37*  CALCIUM 7.8*   < > 7.9*  PROT 5.6*  --   --   BILITOT 0.6  --   --   ALKPHOS 83  --   --   ALT 9  --   --   AST 21  --   --   GLUCOSE 593*   < > 333*   < > = values in this interval not displayed.   No results found for: CKTOTAL, CKMB, CKMBINDEX, TROPONINI  Lab Results  Component Value Date   CHOL 290 (H) 01/22/2020   CHOL 316 (H) 11/24/2019   Lab Results  Component Value Date   HDL 46 01/22/2020   HDL 39 (L) 11/24/2019   Lab Results  Component Value Date   LDLCALC UNABLE TO CALCULATE IF TRIGLYCERIDE OVER 400 mg/dL 01/22/2020   LDLCALC UNABLE TO CALCULATE IF TRIGLYCERIDE OVER 400 mg/dL 11/24/2019   Lab Results  Component Value Date   TRIG 453 (H) 01/22/2020   TRIG 564 (H) 11/24/2019   Lab Results  Component Value Date   CHOLHDL 6.3 01/22/2020   CHOLHDL 8.1 11/24/2019   Lab Results  Component Value Date   LDLDIRECT 140.0 (H) 11/24/2019      Radiology: No results found.  EKG: NSR  with rate of 64 with LVH and T wave inversion  ASSESSMENT AND PLAN:  1. Chest pain, intermittent, unclear etiology, EKG shows SR with LVH and T wave inversion, serial troponin= 35>>43>>39, patient reports that the pain is different from her previous MI, she is medically managed at home with Imdur.   2. CAD s/p CABGx4 in 2018 (performed in [place]), patient is medically optimized thus far with aspirin, statin and beta-blocker.   3.  Hypertensive Urgency, initial BP=196/104>>216/113, patient has been unable to take her home medications for the past few days due to N/V, upon admission patient was given amlodipine, metoprolol and started on a nitroglycerin drip, current BP= 141/78.   4. DM Type II, uncontrolled,  initial CBG=593, patient displayed symptoms of DKA with recurrent N/V, treated with insulin and IV fluids, stable at this time, CBG= 161.  5. Dyslipidemia, reasonably controlled, patient is managed with [employer].    Plan: 1. Obtain EKG now, keep patient NPO to obtain Myoview Multi W/Spect W/Wall Motion/EF on today for further evaluation of chest pain.   2. Discontinue Nitroglycerin drip at this time. Increase Imdur to 60mg  for anginal symptoms.   3. Continue Norvasc, hydralazine and metoprolol for blood pressure control at the current dosages.   4. Continue Cardiac Monitoring continuously and every 4 hour blood pressure monitoring.  Discussed with Dr. Clayborn Bigness who also evaluated the patient and the plan was made in collaboration with him.   Signed: Gladstone Pih MD, PHD, [facility] 01/22/2020, 10:00 AM

## 2020-01-22 NOTE — Progress Notes (Signed)
[place] OF CARE NOTE Patient: Terry Arroyo KKD:[id number]   PCP: Kirk Ruths, MD DOB: [birth year]   DOA: 01/21/2020   DOS: 01/22/2020    Patient was admitted by my colleague Dr. Sidney Ace earlier on 01/22/2020. I have reviewed the H&P as well as assessment and plan and agree with the same. Important changes in the plan are listed below.  Plan of care: Active Problems:   Chest pain   Hypertensive urgency    Cardiology consulted. Patient reports left flank pain. D-dimer is elevated. We will rule out PE with a CT scan  Author: Berle Mull, MD [facility] 01/22/2020 7:03 PM   If 7PM-7AM, please contact night-coverage at [email or web address]

## 2020-01-22 NOTE — ED Notes (Signed)
ED TO INPATIENT HANDOFF REPORT  ED Nurse Name and Phone #: Nolene Bernheim Name/Age/Gender Terry Arroyo 59 y.o. female Room/Bed: ED04A/ED04A  Code Status   Code Status: Full Code  Home/SNF/Other Home Patient oriented to: self, place, time and situation Is this baseline? Yes   Triage Complete: Triage complete  Chief Complaint Chest pain [R07.9]  Triage Note Pt arrival via [facility] from home due to dizziness, chest pain, n/v, left arm pain, and high BP. EMS states that pt originally called out for dizziness but had the other symptoms on arrival. EMS states she threw up her medications today.  Pt has history of HTN and DM. BP 502 systolic with EMS, last BP 174/86.   2 in of nitro paste on pt's chest, 324 asa given in route.    Allergies Allergies  Allergen Reactions  . Shellfish Allergy Anaphylaxis    Shrimp/lobster  Shrimp/lobster    . Kiwi Extract Swelling  . Metformin Other (See Comments)    Other reaction(s): GI Intolerance    Level of Care/Admitting Diagnosis ED Disposition    ED Disposition Condition Comment   Admit  Hospital Area: [place] [place] [[id number]]  Level of Care: Telemetry [5]  Covid Evaluation: Asymptomatic Screening Protocol (No Symptoms)  Diagnosis: Chest pain [774128]  Admitting Physician: Christel Mormon [[id number]]  Attending Physician: Christel Mormon [[id number]]       B Medical/Surgery History Past Medical History:  Diagnosis Date  . Blind left eye   . Brain tumor ([place]) 1986  . Diabetes mellitus without complication ([place])   . Hyperlipidemia   . Hypertension   . MI (myocardial infarction) ([place]) 2018   Past Surgical History:  Procedure Laterality Date  . BRAIN SURGERY    . CESAREAN SECTION       A IV Location/Drains/Wounds Patient Lines/Drains/Airways Status   Active Line/Drains/Airways    Name:   Placement date:   Placement time:   Site:   Days:   Peripheral IV 01/21/20 Right Hand   01/21/20    2031    Hand   1    Peripheral IV 01/22/20 Left Wrist   01/22/20    0145    Wrist   less than 1          Intake/Output Last 24 hours  Intake/Output Summary (Last 24 hours) at 01/22/2020 0259 Last data filed at 01/22/2020 0134 Gross per 24 hour  Intake 1000 ml  Output 1100 ml  Net -100 ml    Labs/Imaging Results for orders placed or performed during the hospital encounter of 01/21/20 (from the past 48 hour(s))  CBC with Differential     Status: Abnormal   Collection Time: 01/21/20  8:32 PM  Result Value Ref Range   WBC 7.8 4.0 - 10.5 K/uL   RBC 4.28 3.87 - 5.11 MIL/uL   Hemoglobin 12.3 12.0 - 15.0 g/dL   HCT 35.2 (L) 36.0 - 46.0 %   MCV 82.2 80.0 - 100.0 fL   MCH 28.7 26.0 - 34.0 pg   MCHC 34.9 30.0 - 36.0 g/dL   RDW 12.5 11.5 - 15.5 %   Platelets 306 150 - 400 K/uL   nRBC 0.0 0.0 - 0.2 %   Neutrophils Relative % 62 %   Neutro Abs 4.8 1.7 - 7.7 K/uL   Lymphocytes Relative 28 %   Lymphs Abs 2.2 0.7 - 4.0 K/uL   Monocytes Relative 7 %   Monocytes Absolute 0.6 0.1 - 1.0 K/uL  Eosinophils Relative 2 %   Eosinophils Absolute 0.2 0.0 - 0.5 K/uL   Basophils Relative 1 %   Basophils Absolute 0.1 0.0 - 0.1 K/uL   Immature Granulocytes 0 %   Abs Immature Granulocytes 0.03 0.00 - 0.07 K/uL    Comment: Performed at [facility], [address] [place], [place] [address]  Comprehensive metabolic panel     Status: Abnormal   Collection Time: 01/21/20  8:32 PM  Result Value Ref Range   Sodium 132 (L) 135 - 145 mmol/L   Potassium 3.0 (L) 3.5 - 5.1 mmol/L   Chloride 95 (L) 98 - 111 mmol/L   CO2 26 22 - 32 mmol/L   Glucose, Bld 593 (HH) 70 - 99 mg/dL    Comment: CRITICAL RESULT CALLED TO, READ BACK BY AND VERIFIED WITH KRISTY BRAND AT 2149 01/21/2020  TFK    BUN 10 6 - 20 mg/dL   Creatinine, Ser 1.43 (H) 0.44 - 1.00 mg/dL   Calcium 7.8 (L) 8.9 - 10.3 mg/dL   Total Protein 5.6 (L) 6.5 - 8.1 g/dL   Albumin 2.0 (L) 3.5 - 5.0 g/dL   AST 21 15 - 41 U/L   ALT 9 0 - 44 U/L   Alkaline  Phosphatase 83 38 - 126 U/L   Total Bilirubin 0.6 0.3 - 1.2 mg/dL   GFR calc non Af Amer 41 (L) >60 mL/min   GFR calc Af Amer 47 (L) >60 mL/min   Anion gap 11 5 - 15    Comment: Performed at [facility], [address] [place], [place] [address]  Troponin I (High Sensitivity)     Status: Abnormal   Collection Time: 01/21/20  8:32 PM  Result Value Ref Range   Troponin I (High Sensitivity) 35 (H) <18 ng/L    Comment: (NOTE) Elevated high sensitivity troponin I (hsTnI) values and significant  changes across serial measurements may suggest ACS but many other  chronic and acute conditions are known to elevate hsTnI results.  Refer to the "Links" section for chest pain algorithms and additional  guidance. Performed at [facility], [address] [place], [place] [address]   Urinalysis, Complete w Microscopic     Status: Abnormal   Collection Time: 01/21/20 11:33 PM  Result Value Ref Range   Color, Urine STRAW (A) YELLOW   APPearance CLEAR (A) CLEAR   Specific Gravity, Urine 1.011 1.005 - 1.030   pH 7.0 5.0 - 8.0   Glucose, UA >=500 (A) NEGATIVE mg/dL   Hgb urine dipstick NEGATIVE NEGATIVE   Bilirubin Urine NEGATIVE NEGATIVE   Ketones, ur NEGATIVE NEGATIVE mg/dL   Protein, ur >=300 (A) NEGATIVE mg/dL   Nitrite NEGATIVE NEGATIVE   Leukocytes,Ua NEGATIVE NEGATIVE   RBC / HPF 0-5 0 - 5 RBC/hpf   WBC, UA 11-20 0 - 5 WBC/hpf   Bacteria, UA RARE (A) NONE SEEN   Squamous Epithelial / LPF 0-5 0 - 5    Comment: Performed at [facility], [address], [place] [address]  Troponin I (High Sensitivity)     Status: Abnormal   Collection Time: 01/22/20 12:09 AM  Result Value Ref Range   Troponin I (High Sensitivity) 43 (H) <18 ng/L    Comment: (NOTE) Elevated high sensitivity troponin I (hsTnI) values and significant  changes across serial measurements may suggest ACS but many other  chronic and acute conditions are known to elevate hsTnI results.   Refer to the "Links" section  for chest pain algorithms and additional  guidance. Performed at [facility], [address] [place],  [address]   Glucose, capillary     Status: Abnormal   Collection Time: 01/22/20  1:53 AM  Result Value Ref Range   Glucose-Capillary 424 (H) 70 - 99 mg/dL   No results found.  Pending Labs Unresulted Labs (From admission, onward)    Start     Ordered   01/22/20 0207  Lipid panel  Once,   STAT     01/22/20 0208   01/22/20 0206  CBC  (enoxaparin (LOVENOX)    CrCl >/= 30 ml/min)  Once,   STAT    Comments: Baseline for enoxaparin therapy IF NOT ALREADY DRAWN.  Notify MD if PLT < 100 K.    01/22/20 0208   01/21/20 2340  SARS CORONAVIRUS 2 (TAT 6-24 HRS) Nasopharyngeal Nasopharyngeal Swab  (Tier 3 (TAT 6-24 hrs))  Once,   STAT    Question Answer Comment  Is this test for diagnosis or screening Screening   Symptomatic for COVID-19 as defined by CDC Unknown   Hospitalized for COVID-19 Unknown   Admitted to ICU for COVID-19 Unknown   Previously tested for COVID-19 Unknown   Resident in a congregate (group) care setting Unknown   Employed in healthcare setting Unknown   Pregnant Unknown      01/21/20 2340          Vitals/Pain Today's Vitals   01/22/20 0018 01/22/20 0100 01/22/20 0209 01/22/20 0245  BP:  (!) 183/92 (!) 187/93 (!) 184/84  Pulse: 80 68 63   Resp:  15 15 16   Temp:      TempSrc:      SpO2:  96% 96%   Weight:      Height:      PainSc:        Isolation Precautions No active isolations  Medications Medications  nitroGLYCERIN 50 mg in dextrose 5 % 250 mL (0.2 mg/mL) infusion (10 mcg/min Intravenous Rate/Dose Change 01/22/20 0224)  aspirin chewable tablet 324 mg (has no administration in time range)  aspirin EC tablet 81 mg (has no administration in time range)  amLODipine (NORVASC) tablet 5 mg (has no administration in time range)  atorvastatin (LIPITOR) tablet 80 mg (has no administration in time range)   hydrALAZINE (APRESOLINE) tablet 50 mg (has no administration in time range)  isosorbide mononitrate (IMDUR) 24 hr tablet 30 mg (has no administration in time range)  torsemide (DEMADEX) tablet 10 mg (has no administration in time range)  mirtazapine (REMERON) tablet 30 mg (has no administration in time range)  insulin aspart protamine- aspart (NOVOLOG MIX 70/30) injection 40 Units (has no administration in time range)  methocarbamol (ROBAXIN) tablet 500 mg (has no administration in time range)  topiramate (TOPAMAX) capsule 25 mg (has no administration in time range)  acetaminophen (TYLENOL) tablet 650 mg (has no administration in time range)  ondansetron (ZOFRAN) injection 4 mg (has no administration in time range)  enoxaparin (LOVENOX) injection 40 mg (has no administration in time range)  0.9 %  sodium chloride infusion (has no administration in time range)  alum & mag hydroxide-simeth (MAALOX/MYLANTA) 200-200-20 MG/5ML suspension 30 mL (has no administration in time range)    And  lidocaine (XYLOCAINE) 2 % viscous mouth solution 15 mL (has no administration in time range)  aspirin EC tablet 81 mg (has no administration in time range)  metoprolol tartrate (LOPRESSOR) tablet 25 mg (has no administration in time range)  zolpidem (AMBIEN) tablet 5 mg (has no administration in time range)  ALPRAZolam (XANAX) tablet 0.25 mg (has no administration in time range)  nitroGLYCERIN 50 mg in dextrose 5 % 250 mL (0.2 mg/mL) infusion (has no administration in time range)  sodium chloride 0.9 % bolus 1,000 mL (0 mLs Intravenous Stopped 01/22/20 0134)  potassium chloride SA (KLOR-CON) CR tablet 40 mEq (40 mEq Oral Given 01/21/20 2321)  metoprolol tartrate (LOPRESSOR) tablet 25 mg (25 mg Oral Given 01/22/20 0018)  amLODipine (NORVASC) tablet 5 mg (5 mg Oral Given 01/22/20 0019)  insulin aspart (novoLOG) injection 10 Units (10 Units Intravenous Given 01/22/20 0216)  cyclobenzaprine (FLEXERIL) tablet 10 mg (10 mg  Oral Given 01/22/20 0215)    Mobility walks with person assist Moderate fall risk   Focused Assessments Cardiac Assessment Handoff:    No results found for: CKTOTAL, CKMB, CKMBINDEX, TROPONINI No results found for: DDIMER Does the Patient currently have chest pain? No      R Recommendations: See Admitting Provider Note  Report given to:   Additional Notes:

## 2020-01-22 NOTE — H&P (Signed)
[facility] [place] NAME: Terry Arroyo    MR#:  [id number]  DATE OF BIRTH:  05-28-1961  DATE OF ADMISSION:  01/21/2020  PRIMARY CARE PHYSICIAN: Terry Ruths, MD   REQUESTING/REFERRING PHYSICIAN: Gonzella Lex, MD CHIEF COMPLAINT:   Chief Complaint  Patient presents with  . Chest Pain  . Nausea  . Weakness    HISTORY OF PRESENT ILLNESS:  Terry Arroyo  is a 59 y.o. female with a known history of hypertension, dyslipidemia, coronary artery disease status post MI, type diabetes mellitus, brain tumor status post resection and left eye blindness, who presented to the emergency room with acute onset of midsternal chest pain felt as pressure and graded 7/10 in severity with associated nausea vomiting and diaphoresis.  She denied any dyspnea or palpitations or cough or wheezing.  Denies any leg pain or edema recent travels or surgeries.  She has been having headache and dizziness.  No paresthesias or focal muscle weakness.  She admits to urinary frequency and urgency without dysuria or hematuria.  She has been having associated back pain.  Upon presentation to the emergency room, blood pressure was 196/104 and later 216/113 with otherwise normal vital signs.  Labs revealed hypokalemia 3 and a blood glucose of 593 with a creatinine of 1.43 better than previous levels, mild hyponatremia and hypochloremia.  Troponin I was 35 and later 43 and CBC was unremarkable.  Urinalysis revealed more than 300 protein and more than 500 glucose with 11-20 WBCs.  COVID-19 PCR is currently pending. EKG showed normal sinus rhythm with a rate of 74 with LVH with T wave inversion laterally.  Repeat EKG showed normal sinus rhythm with rate of 64 with LVH and similar T wave inversion laterally.  The patient was given for me aspirin, 40 mEq p.o. potassium chloride, 1 L bolus of IV normal saline, 10 units of IV regular insulin and was started on IV nitroglycerin drip.  He was also  given his amlodipine and Flexeril.  The patient will be admitted to a telemetry bed for further evaluation and management. PAST MEDICAL HISTORY:   Past Medical History:  Diagnosis Date  . Blind left eye   . Brain tumor (Terry Arroyo) 1986  . Diabetes mellitus without complication (Terry Arroyo)   . Hyperlipidemia   . Hypertension   . MI (myocardial infarction) ([place]) 2018    PAST SURGICAL HISTORY:   Past Surgical History:  Procedure Laterality Date  . BRAIN SURGERY    . CESAREAN SECTION      SOCIAL HISTORY:   Social History   Tobacco Use  . Smoking status: Former Research scientist (life sciences)  . Smokeless tobacco: Never Used  Substance Use Topics  . Alcohol use: Not Currently    FAMILY HISTORY:  Positive for MI in her father in his 8s as well as her brother at the age of 34 when he had a CABG.  History is otherwise positive for hypertension, CVA and type 2 diabetes mellitus DRUG ALLERGIES:   Allergies  Allergen Reactions  . Shellfish Allergy Anaphylaxis    Shrimp/lobster  Shrimp/lobster    . Kiwi Extract Swelling  . Metformin Other (See Comments)    Other reaction(s): GI Intolerance    REVIEW OF SYSTEMS:   Review of Systems  Eyes: Positive for discharge.   As per history of present illness. All pertinent systems were reviewed above. Constitutional,  HEENT, cardiovascular, respiratory, GI, GU, musculoskeletal, neuro, psychiatric, endocrine,  integumentary and hematologic systems were reviewed and  are otherwise  negative/unremarkable except for positive findings mentioned above in the HPI.   MEDICATIONS AT HOME:   Prior to Admission medications   Medication Sig Start Date End Date Taking? Authorizing Provider  amLODipine (NORVASC) 5 MG tablet Take 1 tablet (5 mg total) by mouth 2 (two) times daily. 11/26/19  Yes Terry Hamman, MD  aspirin EC 81 MG EC tablet Take 1 tablet (81 mg total) by mouth daily. 11/26/19  Yes Terry Hamman, MD  atorvastatin (LIPITOR) 80 MG tablet Take 1 tablet (80 mg  total) by mouth daily at 6 PM. 11/26/19  Yes Terry Hamman, MD  Fluticasone-Salmeterol (ADVAIR) 250-50 MCG/DOSE AEPB Inhale 1 puff into the lungs 2 (two) times daily.   Yes [provider]  hydrALAZINE (APRESOLINE) 50 MG tablet Take 1 tablet (50 mg total) by mouth 3 (three) times daily. 11/26/19  Yes Terry Hamman, MD  insulin aspart protamine- aspart (NOVOLOG MIX 70/30) (70-30) 100 UNIT/ML injection Inject 0.4 mLs (40 Units total) into the skin 2 (two) times daily with a meal. 11/26/19  Yes Terry Hamman, MD  methocarbamol (ROBAXIN) 500 MG tablet Take 1 tablet (500 mg total) by mouth every 8 (eight) hours as needed for muscle spasms. 11/26/19  Yes Terry Hamman, MD  metoprolol tartrate (LOPRESSOR) 25 MG tablet Take 1 tablet (25 mg total) by mouth 2 (two) times daily. 11/26/19  Yes Terry Hamman, MD  mirtazapine (REMERON) 30 MG tablet Take 1 tablet (30 mg total) by mouth at bedtime. 11/26/19  Yes Terry Hamman, MD  topiramate (TOPAMAX) 25 MG capsule Take 25 mg by mouth daily.   Yes [provider]  torsemide (DEMADEX) 10 MG tablet Take 10 mg by mouth daily.   Yes [provider]  valACYclovir (VALTREX) 500 MG tablet Take 1 tablet by mouth 2 (two) times daily. 05/20/17  Yes [provider]  isosorbide mononitrate (IMDUR) 30 MG 24 hr tablet Take 1 tablet (30 mg total) by mouth daily. Patient not taking: Reported on 01/22/2020 11/26/19   Terry Hamman, MD      VITAL SIGNS:  Blood pressure (!) 218/114, pulse 80, temperature 98.8 F (37.1 C), temperature source Oral, resp. rate 13, height 5\' 5"  (1.651 m), weight 84.8 kg, SpO2 93 %.  PHYSICAL EXAMINATION:  Physical Exam  GENERAL:  59 y.o.-year-old patient lying in the bed with no acute distress.  EYES: Pupils equal, round, reactive to light and accommodation. No scleral icterus. Extraocular muscles intact.  HEENT: Head atraumatic, normocephalic. Oropharynx and nasopharynx clear.  NECK:  Supple, no jugular  venous distention. No thyroid enlargement, no tenderness.  LUNGS: Normal breath sounds bilaterally, no wheezing, rales,rhonchi or crepitation. No use of accessory muscles of respiration.  CARDIOVASCULAR: Regular rate and rhythm, S1, S2 normal. No murmurs, rubs, or gallops.  ABDOMEN: Soft, nondistended, nontender. Bowel sounds present. No organomegaly or mass.  EXTREMITIES: No pedal edema, cyanosis, or clubbing.  NEUROLOGIC: Cranial nerves II through XII are intact. Muscle strength 5/5 in all extremities. Sensation intact. Gait not checked.  PSYCHIATRIC: The patient is alert and oriented x 3.  Normal affect and good eye contact. SKIN: No obvious rash, lesion, or ulcer.   LABORATORY PANEL:   CBC Recent Labs  Lab 01/21/20 2032  WBC 7.8  HGB 12.3  HCT 35.2*  PLT 306   ------------------------------------------------------------------------------------------------------------------  Chemistries  Recent Labs  Lab 01/21/20 2032  NA 132*  K 3.0*  CL 95*  CO2 26  GLUCOSE  593*  BUN 10  CREATININE 1.43*  CALCIUM 7.8*  AST 21  ALT 9  ALKPHOS 83  BILITOT 0.6   ------------------------------------------------------------------------------------------------------------------  Cardiac Enzymes No results for input(s): TROPONINI in the last 168 hours. ------------------------------------------------------------------------------------------------------------------  RADIOLOGY:  No results found.    IMPRESSION AND PLAN:   1.  Chest pain, rule out acute coronary syndrome.  The patient will be admitted to a telemetry bed.  Will follow serial cardiac enzymes and EKGs.  We will obtain a cardiology consult in a.m. for further cardiac risk stratification.  The patient will be placed on aspirin as well as p.r.n. sublingual nitroglycerin and morphine sulfate for pain.  This could be secondary to hypertensive urgency.  I notified Dr. Clayborn Bigness about the patient.  2.  Hypertensive urgency.  We  will continue the patient on IV nitroglycerin drip and continue her antihypertensives including Norvasc, hydralazine and Lopressor.  3.  Uncontrolled type 2 diabetes mellitus with nonketotic hyperglycemia.  The patient was given 10 units of IV regular insulin.  Will continue basal coverage with NovoLog Mix 70/30 and place the patient on supplemental coverage with NovoLog with frequent fingerstick blood glucose measures.  4.  Hypokalemia.  Potassium will be replaced and magnesium level will be checked.  5.  UTI.  The patient will be placed on IV Rocephin and will follow urine culture and sensitivity.  6.  Dyslipidemia.  Statin therapy will be resumed.  7.  DVT prophylaxis.  Subcutaneous Lovenox.       All the records are reviewed and case discussed with ED provider. The plan of care was discussed in details with the patient (and family). I answered all questions. The patient agreed to proceed with the above mentioned plan. Further management will depend upon hospital course.   CODE STATUS: Full code  TOTAL TIME TAKING CARE OF THIS PATIENT: 55 minutes.    Christel Mormon M.D on 01/22/2020 at 2:15 AM  [facility]   From 7 PM-7 AM, contact night-coverage [email or web address]  CC: Primary care physician; Terry Ruths, MD   Note: This dictation was prepared with Dragon dictation along with smaller phrase technology. Any transcriptional errors that result from this process are unintentional.

## 2020-01-22 NOTE — ED Notes (Signed)
Introduced self to patient, patient's BP elevated, MD notified and orders to be placed.

## 2020-01-22 NOTE — ED Provider Notes (Addendum)
Patient remains extremely hypertensive with systolics in the 295M and troponin trending up from 35-43.  She remains chest pain-free.  Will put patient on a nitro drip and get her admitted to the hospitalist service.  Her blood glucose is elevated at 593 with no evidence of DKA.  A repeat CBG is pending.   CRITICAL CARE Performed by: Rudene Re  ?  Total critical care time: 30 min  Critical care time was exclusive of separately billable procedures and treating other patients.  Critical care was necessary to treat or prevent imminent or life-threatening deterioration.  Critical care was time spent personally by me on the following activities: development of treatment plan with patient and/or surrogate as well as nursing, discussions with consultants, evaluation of patient's response to treatment, examination of patient, obtaining history from patient or surrogate, ordering and performing treatments and interventions, ordering and review of laboratory studies, ordering and review of radiographic studies, pulse oximetry and re-evaluation of patient's condition.    _________________________ 1:58 AM on 01/22/2020 -----------------------------------------  Patient complaining of lower back spasms which are chronic and she usually takes a muscle relaxant for those. Will give flexeril. She also had another episode of CP just before nitro was started which resolved with nitro. Repeat EKG unchanged from EKG done at arrival which does show new TWI in the lateral leads when compared to prior.  Patient's chest pain resolved soon as nitro was initiated.   Alfred Levins, [place], MD 01/22/20 [place], [place], MD 01/22/20 0200

## 2020-01-22 NOTE — Progress Notes (Signed)
FSBS 55- orange juice given / recheck FSBS improved 97- pt currently eating/ will continue to monitor.

## 2020-01-23 DIAGNOSIS — I161 Hypertensive emergency: Secondary | ICD-10-CM

## 2020-01-23 DIAGNOSIS — R110 Nausea: Secondary | ICD-10-CM

## 2020-01-23 DIAGNOSIS — R739 Hyperglycemia, unspecified: Secondary | ICD-10-CM

## 2020-01-23 LAB — CBC WITH DIFFERENTIAL/PLATELET
Abs Immature Granulocytes: 0.02 10*3/uL (ref 0.00–0.07)
Basophils Absolute: 0.1 10*3/uL (ref 0.0–0.1)
Basophils Relative: 1 %
Eosinophils Absolute: 0.5 10*3/uL (ref 0.0–0.5)
Eosinophils Relative: 5 %
HCT: 38.8 % (ref 36.0–46.0)
Hemoglobin: 12.7 g/dL (ref 12.0–15.0)
Immature Granulocytes: 0 %
Lymphocytes Relative: 34 %
Lymphs Abs: 3.6 10*3/uL (ref 0.7–4.0)
MCH: 28.3 pg (ref 26.0–34.0)
MCHC: 32.7 g/dL (ref 30.0–36.0)
MCV: 86.4 fL (ref 80.0–100.0)
Monocytes Absolute: 0.6 10*3/uL (ref 0.1–1.0)
Monocytes Relative: 5 %
Neutro Abs: 6 10*3/uL (ref 1.7–7.7)
Neutrophils Relative %: 55 %
Platelets: 363 10*3/uL (ref 150–400)
RBC: 4.49 MIL/uL (ref 3.87–5.11)
RDW: 13 % (ref 11.5–15.5)
WBC: 10.8 10*3/uL — ABNORMAL HIGH (ref 4.0–10.5)
nRBC: 0 % (ref 0.0–0.2)

## 2020-01-23 LAB — COMPREHENSIVE METABOLIC PANEL
ALT: 11 U/L (ref 0–44)
AST: 17 U/L (ref 15–41)
Albumin: 2.2 g/dL — ABNORMAL LOW (ref 3.5–5.0)
Alkaline Phosphatase: 82 U/L (ref 38–126)
Anion gap: 9 (ref 5–15)
BUN: 16 mg/dL (ref 6–20)
CO2: 25 mmol/L (ref 22–32)
Calcium: 7.8 mg/dL — ABNORMAL LOW (ref 8.9–10.3)
Chloride: 104 mmol/L (ref 98–111)
Creatinine, Ser: 1.72 mg/dL — ABNORMAL HIGH (ref 0.44–1.00)
GFR calc Af Amer: 38 mL/min — ABNORMAL LOW (ref >60)
GFR calc non Af Amer: 32 mL/min — ABNORMAL LOW (ref >60)
Glucose, Bld: 182 mg/dL — ABNORMAL HIGH (ref 70–99)
Potassium: 3.4 mmol/L — ABNORMAL LOW (ref 3.5–5.1)
Sodium: 138 mmol/L (ref 135–145)
Total Bilirubin: 0.5 mg/dL (ref 0.3–1.2)
Total Protein: 5.9 g/dL — ABNORMAL LOW (ref 6.5–8.1)

## 2020-01-23 LAB — GLUCOSE, CAPILLARY
Glucose-Capillary: 132 mg/dL — ABNORMAL HIGH (ref 70–99)
Glucose-Capillary: 150 mg/dL — ABNORMAL HIGH (ref 70–99)
Glucose-Capillary: 166 mg/dL — ABNORMAL HIGH (ref 70–99)
Glucose-Capillary: 93 mg/dL (ref 70–99)

## 2020-01-23 LAB — URINE CULTURE: Culture: NO GROWTH

## 2020-01-23 LAB — MAGNESIUM: Magnesium: 1.9 mg/dL (ref 1.7–2.4)

## 2020-01-23 LAB — TROPONIN I (HIGH SENSITIVITY): Troponin I (High Sensitivity): 34 ng/L — ABNORMAL HIGH (ref <18)

## 2020-01-23 MED ORDER — ISOSORBIDE MONONITRATE ER 60 MG PO TB24: 60.0000 mg | ORAL_TABLET | Freq: Every day | ORAL | 0 refills | Status: DC

## 2020-01-23 MED ORDER — CEPHALEXIN 250 MG PO CAPS: 250.0000 mg | ORAL_CAPSULE | Freq: Two times a day (BID) | ORAL | 0 refills | Status: AC

## 2020-01-23 NOTE — TOC Initial Note (Signed)
Transition of Care [facility]) - Initial/Assessment Note    Patient Details  Name: Terry Arroyo MRN: [id number] Date of Birth: November 22, 1961  Transition of [facility]) CM/SW Contact:    Victorino Dike, RN Phone Number: 01/23/2020, 1:49 PM  Clinical Narrative:                   Patient to discharge home today.  Cab voucher provided to patient.  [place] PT services will be provided by [facility].     Barriers to Discharge: Barriers Resolved   Patient Goals and CMS Choice        Expected Discharge Plan and Services           Expected Discharge Date: 01/23/20               DME Arranged: N/A DME Agency: NA       HH Arranged: PT HH Agency: [facility] [employer] Date [place]: 01/23/20 Time [place]: 73 Representative spoke with at [place] Arrangements/Services                       Activities of Daily Living Home Assistive Devices/Equipment: Radio producer (specify quad or straight) ADL Screening (condition at time of admission) Patient's cognitive ability adequate to safely complete daily activities?: Yes Is the patient deaf or have difficulty hearing?: No Does the patient have difficulty seeing, even when wearing glasses/contacts?: No Does the patient have difficulty concentrating, remembering, or making decisions?: No Patient able to express need for assistance with ADLs?: Yes Does the patient have difficulty dressing or bathing?: No Independently performs ADLs?: Yes (appropriate for developmental age) Does the patient have difficulty walking or climbing stairs?: No Weakness of Legs: None Weakness of Arms/Hands: None  Permission Sought/Granted                  Emotional Assessment              Admission diagnosis:  Nausea [R11.0] Hyperglycemia [R73.9] Hypertensive emergency [I16.1] Chest pain [R07.9] Nonspecific chest pain [R07.9] Hypertensive urgency [I16.0] Patient Active  Problem List   Diagnosis Date Noted  . Hyperglycemia   . Nausea   . Hypertensive emergency   . Hypertensive urgency 11/24/2019  . Nonspecific chest pain 11/23/2019   PCP:  Kirk Ruths, MD Pharmacy:   [facility] [address], [place] [address] [place] [address] Phone: [phone] Fax: [phone]     Social Determinants of Health (SDOH) Interventions    Readmission Risk Interventions No flowsheet data found.

## 2020-01-23 NOTE — Plan of Care (Signed)
Up to bathroom with standby assist.  No increased pai or shortness of breath noted.

## 2020-01-23 NOTE — Discharge Instructions (Signed)
Chest Wall Pain Chest wall pain is pain in or around the bones and muscles of your chest. Chest wall pain may be caused by:  An injury.  Coughing a lot.  Using your chest and arm muscles too much. Sometimes, the cause may not be known. This pain may take a few weeks or longer to get better. Follow these instructions at home: Managing pain, stiffness, and swelling If told, put ice on the painful area:  Put ice in a plastic bag.  Place a towel between your skin and the bag.  Leave the ice on for 20 minutes, 2-3 times a day.  Activity  Rest as told by your doctor.  Avoid doing things that cause pain. This includes lifting heavy items.  Ask your doctor what activities are safe for you. General instructions   Take over-the-counter and prescription medicines only as told by your doctor.  Do not use any products that contain nicotine or tobacco, such as cigarettes, e-cigarettes, and chewing tobacco. If you need help quitting, ask your doctor.  Keep all follow-up visits as told by your doctor. This is important. Contact a doctor if:  You have a fever.  Your chest pain gets worse.  You have new symptoms. Get help right away if:  You feel sick to your stomach (nauseous) or you throw up (vomit).  You feel sweaty or light-headed.  You have a cough with mucus from your lungs (sputum) or you cough up blood.  You are short of breath. These symptoms may be an emergency. Do not wait to see if the symptoms will go away. Get medical help right away. Call your local emergency services (911 in the [place] Do not drive yourself to the hospital. Summary  Chest wall pain is pain in or around the bones and muscles of your chest.  It may be treated with ice, rest, and medicines. Your condition may also get better if you avoid doing things that cause pain.  Contact a doctor if you have a fever, chest pain that gets worse, or new symptoms.  Get help right away if you feel light-headed  or you get short of breath. These symptoms may be an emergency. This information is not intended to replace advice given to you by your health care provider. Make sure you discuss any questions you have with your health care provider. Document Revised: 06/08/2018 Document Reviewed: 06/08/2018 Elsevier Patient Education  2020 [employer]  

## 2020-01-23 NOTE — Progress Notes (Signed)
Inpatient Diabetes Program Recommendations  AACE/ADA: New Consensus Statement on Inpatient Glycemic Control (2015)  Target Ranges:  Prepandial:   less than 140 mg/dL      Peak postprandial:   less than 180 mg/dL (1-2 hours)      Critically ill patients:  140 - 180 mg/dL   Lab Results  Component Value Date   GLUCAP 93 01/23/2020   HGBA1C 14.1 (H) 01/22/2020    Spoke with pt regarding A1c level of 14.1% and glucose control at home. Pt reports taking her insulin as prescribed. Pt also reports she checks her glucose everyday and usually has trends in upper 100 range occasionally in the 200's. Discussed current A1c level. Reviewed glucose and A1c goals. Stressed importance of glucose control to reduce complications.  Pt reports drinking orange and apple juice all day long. Discussed in detail dietary modifications and what to drink. Pt to stop drinking all juices.   Discussed observations with pt glucose trends while here only on half of her home dose. Told pt she really was drinking more juice than what she needs to. Pt also reports snacking on the wrong things during the day. Pt reports she wants to try to do better when she gets home.  Pt reports blurry vision.  Pt reports needing a new glucose meter as her gives her error messages. Pt also reports she needs help finding someone to do an eye exam on her in the area.   Thanks,  Tama Headings RN, MSN, BC-ADM Inpatient Diabetes Coordinator Team Pager [phone] (8a-5p)

## 2020-01-23 NOTE — Consult Note (Signed)
[facility] Card[facility]    SUBJECTIVE: The patient reports to be doing well today and is without chest pain at this time. There were no issues overnight and she reports that she has not experienced the chest pain since her admission on yesterday. She is SR on the telemetry monitor with a HR=68. The patient continues to be mildly hypertensive with most recent bp= 140/74 (at 0414) and continues to report left-side flank pain rated at 4/10 at this time. She denies any dyspnea, dizziness, syncope, palpitations or headache. She is now without nausea or vomiting and is able to keep her food and medications down.    Vitals:   01/22/20 1524 01/22/20 2100 01/23/20 0414 01/23/20 0758  BP: 116/76 131/79 140/74 (!) 158/86  Pulse: 78 77 63 68  Resp: 18 18 18    Temp:  98.3 F (36.8 C) 98.3 F (36.8 C) 97.9 F (36.6 C)  TempSrc:  Oral Oral Oral  SpO2: 100% 98% 100% 99%  Weight:      Height:         Intake/Output Summary (Last 24 hours) at 01/23/2020 1136 Last data filed at 01/23/2020 0900 Gross per 24 hour  Intake 364.26 ml  Output 300 ml  Net 64.26 ml      PHYSICAL EXAM  General: well-groomed, well nourished, in no acute distress HEENT:  Normocephalic and atraumatic, left eye blindness Neck:  No JVD.  Lungs: upper lobes clear bilaterally to auscultation, Lower lobes are slightly diminished to auscultation  Heart: HRRR . Normal S1 and S2 without gallops or murmurs.  Abdomen: Bowel sounds are positive, abdomen soft and non-tender  Msk:  Back normal, abnormal gait, Normal strength and tone for age. Extremities: No clubbing, cyanosis or edema.   Neuro: Alert and oriented X 3. Psych:  Good affect, responds appropriately  LABS: Basic Metabolic Panel: Recent Labs    01/22/20 0607 01/23/20 0843  NA 140 138  K 3.4* 3.4*  CL 103 104  CO2 26 25  GLUCOSE 333* 182*  BUN 10 16  CREATININE 1.37* 1.72*  CALCIUM 7.9* 7.8*  MG 1.7 1.9   Liver Function Tests: Recent Labs    01/21/20 2032  01/23/20 0843  AST 21 17  ALT 9 11  ALKPHOS 83 82  BILITOT 0.6 0.5  PROT 5.6* 5.9*  ALBUMIN 2.0* 2.2*   No results for input(s): LIPASE, AMYLASE in the last 72 hours. CBC: Recent Labs    01/21/20 2032 01/21/20 2032 01/22/20 0607 01/23/20 0843  WBC 7.8   < > 8.1 10.8*  NEUTROABS 4.8  --   --  6.0  HGB 12.3   < > 12.4 12.7  HCT 35.2*   < > 36.7 38.8  MCV 82.2   < > 84.0 86.4  PLT 306   < > 323 363   < > = values in this interval not displayed.   Cardiac Enzymes: No results for input(s): CKTOTAL, CKMB, CKMBINDEX, TROPONINI in the last 72 hours. BNP: Invalid input(s): POCBNP D-Dimer: No results for input(s): DDIMER in the last 72 hours. Hemoglobin A1C: Recent Labs    01/22/20 0607  HGBA1C 14.1*   Fasting Lipid Panel: Recent Labs    01/22/20 0607  CHOL 290*  HDL 46  LDLCALC UNABLE TO CALCULATE IF TRIGLYCERIDE OVER 400 mg/dL  TRIG 453*  CHOLHDL 6.3  LDLDIRECT 145.2*   Thyroid Function Tests: No results for input(s): TSH, T4TOTAL, T3FREE, THYROIDAB in the last 72 hours.  Invalid input(s): FREET3 Anemia Panel: No results for  input(s): VITAMINB12, FOLATE, FERRITIN, TIBC, IRON, RETICCTPCT in the last 72 hours.  CT ANGIO CHEST PE W OR WO CONTRAST  Result Date: 01/22/2020 CLINICAL DATA:  Pulmonary embolism. Chest pain and dizziness. EXAM: CT ANGIOGRAPHY CHEST WITH CONTRAST TECHNIQUE: Multidetector CT imaging of the chest was performed using the standard protocol during bolus administration of intravenous contrast. Multiplanar CT image reconstructions and MIPs were obtained to evaluate the vascular anatomy. CONTRAST:  53mL OMNIPAQUE IOHEXOL 350 MG/ML SOLN COMPARISON:  November 23, 2019. FINDINGS: Cardiovascular: Contrast injection is sufficient to demonstrate satisfactory opacification of the pulmonary arteries to the segmental level. There is no pulmonary embolus. The main pulmonary artery is within normal limits for size. There is no CT evidence of acute right heart  strain. There are mild atherosclerotic changes of the visualized thoracic aorta. Heart size is enlarged. The patient is status post prior median sternotomy and CABG. There is thickening of the left ventricular wall. Mediastinum/Nodes: --No mediastinal or hilar lymphadenopathy. --No axillary lymphadenopathy. --No supraclavicular lymphadenopathy. --Normal thyroid gland. --The esophagus is unremarkable Lungs/Pleura: No pulmonary nodules or masses. No pleural effusion or pneumothorax. No focal airspace consolidation. No focal pleural abnormality. Upper Abdomen: No acute abnormality. Musculoskeletal: No chest wall abnormality. No acute or significant osseous findings. Review of the MIP images confirms the above findings. IMPRESSION: 1. No evidence of acute pulmonary embolism. 2. Cardiomegaly with left ventricular wall thickening. Aortic Atherosclerosis (ICD10-I70.0). Electronically Signed   By: Constance Holster M.D.   On: 01/22/2020 21:34   NM Myocar Multi W/Spect W/Wall Motion / EF  Result Date: 01/22/2020  Blood pressure demonstrated a normal response to exercise.  There was no ST segment deviation noted during stress.  No T wave inversion was noted during stress.  Defect 1: There is a medium defect of moderate severity present in the basal inferoseptal, basal inferior, basal inferolateral, mid inferoseptal, mid inferior, mid inferolateral and apical inferior location.  Findings consistent with prior myocardial infarction with peri-infarct ischemia.  This is an intermediate risk study.  The left ventricular ejection fraction is severely decreased (<30%).  Nuclear stress EF: 30%.  Conclusion Adequate chemical stress Await nuclear images    TELEMETRY: SR with HR=68  ASSESSMENT AND PLAN:  Active Problems:   Chest pain   Hypertensive urgency  CAD s/p CABGx4 DM Type II, uncontrolled Dyslipidemia  Plan:  1. Myoview was slightly abnormal but likely chronic, no significant evidence of ischemia, EF  severely reduced at <30%. Nitroglycerin gtt was stop on yesterday and the patient was placed on Imdur 60mg  upon our recommendation. The patient currently denies all chest pain at this time. Continue Imdur at 60mg  for anginal symptoms and to assist with bp control.   2. No indication for further cardiac diagnostics at this time, but patient should be further evaluated upon follow-up with her primary cardiologist.   3. Patient continues to be mildly hypertensive with bp=140/74, reasonably stable at this time, she denies any headache or dizziness. Continue home medication management with Norvasc, hydralazine and metoprolol at the current dosages for blood pressure control with the addition of Imur 60mg .   4. Recommend ambulating the patient around the unit, with assistance, to ensure that no exertional chest pain occurs.   5. Follow-up with primary Cardiologist in 1 week.  6. Patient can be discharged from a Cardiology standpoint.   Discussed with Dr. Clayborn Bigness who also evaluated the patient and the plan was made in collaboration with him.     Gladstone Pih, NP, ACNPC-AG  01/23/2020  11:36 AM

## 2020-01-23 NOTE — Evaluation (Signed)
Physical Therapy Evaluation Patient Details Name: Terry Arroyo MRN: [id number] DOB: 03-07-1961 Today's Date: 01/23/2020   History of Present Illness  Per MD notes: Pt is a 59 y.o. female with a known history of hypertension, dyslipidemia, coronary artery disease status post MI, type diabetes mellitus, brain tumor status post resection and left eye blindness, who presented to the emergency room with acute onset of midsternal chest pain felt as pressure and graded 7/10 in severity with associated nausea vomiting and diaphoresis.  MD assessment includes: Chest pain, intermittent, unclear etiology, HTN, uncontrolled DM II, hypokalemia, UTI.    Clinical Impression  Pt pleasant and motivated to participate during the session.  Pt reports has been actively attempting to increase her activity at home including use of a peddler and is looking into purchasing a treadmill.  Pt put forth good effort during the session and did not require any physical assistance to perform tasks although she did require extra time and effort with bed mobility and transfers and ambulated with very slow cadence.  Pt's SpO2 on room air remained >/= 96% throughout the session with HR increasing from a resting rate of 64 bpm to mid 70s with amb.  Pt reported no adverse symptoms during the session other than very minimal SOB after amb.  Pt will benefit from HHPT services upon discharge to safely address deficits listed in patient problem list for decreased risk of further functional decline and eventual return to PLOF.      Follow Up Recommendations Home health PT    Equipment Recommendations  None recommended by PT    Recommendations for Other Services       Precautions / Restrictions Precautions Precautions: Fall Restrictions Weight Bearing Restrictions: No      Mobility  Bed Mobility Overal bed mobility: Modified Independent             General bed mobility comments: Extra time and effort  required  Transfers Overall transfer level: Needs assistance Equipment used: Rolling walker (2 wheeled) Transfers: Sit to/from Stand Sit to Stand: Min guard         General transfer comment: Fair eccentric and concentric control  Ambulation/Gait Ambulation/Gait assistance: Min guard Gait Distance (Feet): 150 Feet Assistive device: Rolling walker (2 wheeled) Gait Pattern/deviations: Decreased step length - right;Decreased step length - left;Step-through pattern Gait velocity: decreased   General Gait Details: Slow cadence but steady without LOB  Stairs            Wheelchair Mobility    Modified Rankin (Stroke Patients Only)       Balance Overall balance assessment: Needs assistance Sitting-balance support: Feet supported;No upper extremity supported Sitting balance-Leahy Scale: Normal     Standing balance support: Bilateral upper extremity supported Standing balance-Leahy Scale: Fair                               Pertinent Vitals/Pain Pain Assessment: 0-10 Pain Score: 3  Pain Location: low back Pain Descriptors / Indicators: Sore Pain Intervention(s): Premedicated before session;Monitored during session    Home Living Family/patient expects to be discharged to:: Private residence Living Arrangements: Parent Available Help at Discharge: Family;Available 24 hours/day Type of Home: House Home Access: Level entry     Home Layout: One level Home Equipment: Bedside commode;Walker - 2 wheels;Cane - single point;Shower seat;Grab bars - tub/shower Additional Comments: Pt lives with father who is 15 but very high functioning and able to assist as needed  Prior Function Level of Independence: Independent with assistive device(s)         Comments: Pt mostly ambulates without and AD limited community distances but occasionally uses a SPC when back pain is high, one fall in the last year secondry to "legs gave out", Ind with ADLs     Hand  Dominance        Extremity/Trunk Assessment   Upper Extremity Assessment Upper Extremity Assessment: Defer to OT evaluation    Lower Extremity Assessment Lower Extremity Assessment: Generalized weakness       Communication   Communication: No difficulties  Cognition Arousal/Alertness: Awake/alert Behavior During Therapy: WFL for tasks assessed/performed Overall Cognitive Status: Within Functional Limits for tasks assessed                                        General Comments      Exercises Total Joint Exercises Ankle Circles/Pumps: AROM;Strengthening;Both;10 reps Quad Sets: Strengthening;Both;10 reps Gluteal Sets: Strengthening;Both;10 reps Heel Slides: AROM;Strengthening;Both;10 reps Hip ABduction/ADduction: Strengthening;Both;10 reps Long Arc Quad: Strengthening;Both;10 reps Knee Flexion: Strengthening;Both;10 reps Marching in Standing: AROM;Both;Standing;5 reps Other Exercises Other Exercises: HEP education for BLE APs, QS, LAQ, and GS x 10 each every 1-2 hours, BLE hip abd/add x 10 each 2x/day   Assessment/Plan    PT Assessment Patient needs continued PT services  PT Problem List Decreased strength;Decreased activity tolerance;Decreased balance;Decreased mobility       PT Treatment Interventions DME instruction;Gait training;Functional mobility training;Therapeutic activities;Therapeutic exercise;Balance training;Stair training;Patient/family education    PT Goals (Current goals can be found in the Care Plan section)  Acute Rehab PT Goals Patient Stated Goal: To walk better and return home PT Goal Formulation: With patient Time For Goal Achievement: 02/05/20 Potential to Achieve Goals: Good    Frequency Min 2X/week   Barriers to discharge        Co-evaluation               AM-PAC PT "6 Clicks" Mobility  Outcome Measure Help needed turning from your back to your side while in a flat bed without using bedrails?: A Little Help  needed moving from lying on your back to sitting on the side of a flat bed without using bedrails?: A Little Help needed moving to and from a bed to a chair (including a wheelchair)?: A Little Help needed standing up from a chair using your arms (e.g., wheelchair or bedside chair)?: A Little Help needed to walk in hospital room?: A Little Help needed climbing 3-5 steps with a railing? : A Little 6 Click Score: 18    End of Session Equipment Utilized During Treatment: Gait belt Activity Tolerance: Patient tolerated treatment well Patient left: in bed;with call bell/phone within reach;with bed alarm set;Other (comment)(Pt declined up in chair) Nurse Communication: Mobility status PT Visit Diagnosis: Difficulty in walking, not elsewhere classified (R26.2);Muscle weakness (generalized) (M62.81);History of falling (Z91.81)    Time: 6237-6283 PT Time Calculation (min) (ACUTE ONLY): 29 min   Charges:   PT Evaluation $PT Eval Moderate Complexity: 1 Mod PT Treatments $Therapeutic Exercise: 8-22 mins        D. Royetta Asal PT, DPT 01/23/20, 12:14 PM

## 2020-01-23 NOTE — Progress Notes (Signed)
Discharge instructions explained to pt/ verbalized an understanding/ iv and tele removed/ taxi voucher provided/  transported off unit via wheelchair

## 2020-01-26 DIAGNOSIS — Z7982 Long term (current) use of aspirin: Secondary | ICD-10-CM | POA: Diagnosis not present

## 2020-01-26 DIAGNOSIS — I10 Essential (primary) hypertension: Secondary | ICD-10-CM | POA: Diagnosis not present

## 2020-01-26 DIAGNOSIS — I2510 Atherosclerotic heart disease of native coronary artery without angina pectoris: Secondary | ICD-10-CM | POA: Diagnosis not present

## 2020-01-26 DIAGNOSIS — E785 Hyperlipidemia, unspecified: Secondary | ICD-10-CM | POA: Diagnosis not present

## 2020-01-26 DIAGNOSIS — E1165 Type 2 diabetes mellitus with hyperglycemia: Secondary | ICD-10-CM | POA: Diagnosis not present

## 2020-01-26 DIAGNOSIS — N390 Urinary tract infection, site not specified: Secondary | ICD-10-CM | POA: Diagnosis not present

## 2020-01-26 DIAGNOSIS — I252 Old myocardial infarction: Secondary | ICD-10-CM | POA: Diagnosis not present

## 2020-01-26 DIAGNOSIS — Z794 Long term (current) use of insulin: Secondary | ICD-10-CM | POA: Diagnosis not present

## 2020-01-26 DIAGNOSIS — H5462 Unqualified visual loss, left eye, normal vision right eye: Secondary | ICD-10-CM | POA: Diagnosis not present

## 2020-01-26 NOTE — Discharge Summary (Signed)
[place] [facility] [place] NAME: Terry Arroyo    MR#:  [id number]  DATE OF BIRTH:  05-Apr-1961  DATE OF ADMISSION:  01/21/2020   ADMITTING PHYSICIAN: Christel Mormon, MD  DATE OF DISCHARGE: 01/23/2020  3:31 PM  PRIMARY CARE PHYSICIAN: Kirk Ruths, MD   ADMISSION DIAGNOSIS:  Nausea [R11.0] Hyperglycemia [R73.9] Hypertensive emergency [I16.1] Chest pain [R07.9] Nonspecific chest pain [R07.9] Hypertensive urgency [I16.0] DISCHARGE DIAGNOSIS:  Active Problems:   Nonspecific chest pain   Hypertensive urgency   Hyperglycemia   Nausea   Hypertensive emergency  SECONDARY DIAGNOSIS:   Past Medical History:  Diagnosis Date  . Blind left eye   . Brain tumor ([place]) 1986  . Diabetes mellitus without complication ([place])   . Hyperlipidemia   . Hypertension   . MI (myocardial infarction) [facility]) 2018   HOSPITAL COURSE:  Terry Arroyo  is a 59 y.o. female with a known history of hypertension, dyslipidemia, coronary artery disease status post MI, type diabetes mellitus, brain tumor status post resection and left eye blindness, admitted with acute onset of midsternal chest pain felt as pressure and graded 7/10 in severity with associated nausea vomiting and diaphoresis.  1.  Chest pain, ruled out acute coronary syndrome.  Patient was ruled out neg serial troponins - Myoview was slightly abnormal but fixed defect per cardio, no significant evidence of ischemia, EF severely reduced at <30%. The patient currently denies all chest pain at this time. Continue Imdur at 60mg  for anginal symptoms and to assist with bp control.   2.  Hypertensive urgency.  initially required IV nitroglycerin drip and resolved.   3.  Uncontrolled type 2 diabetes mellitus with nonketotic hyperglycemia resolved  4.  Hypokalemia - repleted  5.  UTI - treated  6.  Dyslipidemia.  Statin therapy will be resumed at D/C  DISCHARGE CONDITIONS:  stable CONSULTS OBTAINED:   DRUG ALLERGIES:    Allergies  Allergen Reactions  . Shellfish Allergy Anaphylaxis    Shrimp/lobster  Shrimp/lobster    . Kiwi Extract Swelling  . Metformin Other (See Comments)    Other reaction(s): GI Intolerance   DISCHARGE MEDICATIONS:   Allergies as of 01/23/2020      Reactions   Shellfish Allergy Anaphylaxis   Shrimp/lobster  Shrimp/lobster    Kiwi Extract Swelling   Metformin Other (See Comments)   Other reaction(s): GI Intolerance      Medication List    TAKE these medications   amLODipine 5 MG tablet Commonly known as: NORVASC Take 1 tablet (5 mg total) by mouth 2 (two) times daily.   aspirin 81 MG EC tablet Take 1 tablet (81 mg total) by mouth daily.   atorvastatin 80 MG tablet Commonly known as: LIPITOR Take 1 tablet (80 mg total) by mouth daily at 6 PM.   Fluticasone-Salmeterol 250-50 MCG/DOSE Aepb Commonly known as: ADVAIR Inhale 1 puff into the lungs 2 (two) times daily.   hydrALAZINE 50 MG tablet Commonly known as: APRESOLINE Take 1 tablet (50 mg total) by mouth 3 (three) times daily.   insulin aspart protamine- aspart (70-30) 100 UNIT/ML injection Commonly known as: NOVOLOG MIX 70/30 Inject 0.4 mLs (40 Units total) into the skin 2 (two) times daily with a meal.   isosorbide mononitrate 60 MG 24 hr tablet Commonly known as: IMDUR Take 1 tablet (60 mg total) by mouth daily. What changed:   medication strength  how much to take   methocarbamol 500 MG tablet Commonly known as:  ROBAXIN Take 1 tablet (500 mg total) by mouth every 8 (eight) hours as needed for muscle spasms.   metoprolol tartrate 25 MG tablet Commonly known as: LOPRESSOR Take 1 tablet (25 mg total) by mouth 2 (two) times daily.   mirtazapine 30 MG tablet Commonly known as: REMERON Take 1 tablet (30 mg total) by mouth at bedtime.   topiramate 25 MG capsule Commonly known as: TOPAMAX Take 25 mg by mouth daily.   torsemide 10 MG tablet Commonly known as: DEMADEX Take 10 mg by mouth  daily.   valACYclovir 500 MG tablet Commonly known as: VALTREX Take 1 tablet by mouth 2 (two) times daily.     ASK your doctor about these medications   cephALEXin 250 MG capsule Commonly known as: KEFLEX Take 1 capsule (250 mg total) by mouth 2 (two) times daily for 2 days. Ask about: Should I take this medication?      DISCHARGE INSTRUCTIONS:   DIET:  Cardiac diet DISCHARGE CONDITION:  Good ACTIVITY:  Activity as tolerated OXYGEN:  Home Oxygen: No.  Oxygen Delivery: room air DISCHARGE LOCATION:  home with HHPT  If you experience worsening of your admission symptoms, develop shortness of breath, life threatening emergency, suicidal or homicidal thoughts you must seek medical attention immediately by calling 911 or calling your MD immediately  if symptoms less severe.  You Must read complete instructions/literature along with all the possible adverse reactions/side effects for all the Medicines you take and that have been prescribed to you. Take any new Medicines after you have completely understood and accpet all the possible adverse reactions/side effects.   Please note  You were cared for by a hospitalist during your hospital stay. If you have any questions about your discharge medications or the care you received while you were in the hospital after you are discharged, you can call the unit and asked to speak with the hospitalist on call if the hospitalist that took care of you is not available. Once you are discharged, your primary care physician will handle any further medical issues. Please note that NO REFILLS for any discharge medications will be authorized once you are discharged, as it is imperative that you return to your primary care physician (or establish a relationship with a primary care physician if you do not have one) for your aftercare needs so that they can reassess your need for medications and monitor your lab values.    On the day of Discharge:  VITAL  SIGNS:  Blood pressure (!) 145/71, pulse 73, temperature (!) 97.5 F (36.4 C), temperature source Oral, resp. rate 16, height 5\' 6"  (1.676 m), weight 79.3 kg, SpO2 100 %. PHYSICAL EXAMINATION:  GENERAL:  59 y.o.-year-old patient lying in the bed with no acute distress.  EYES: Pupils equal, round, reactive to light and accommodation. No scleral icterus. Extraocular muscles intact.  HEENT: Head atraumatic, normocephalic. Oropharynx and nasopharynx clear.  NECK:  Supple, no jugular venous distention. No thyroid enlargement, no tenderness.  LUNGS: Normal breath sounds bilaterally, no wheezing, rales,rhonchi or crepitation. No use of accessory muscles of respiration.  CARDIOVASCULAR: S1, S2 normal. No murmurs, rubs, or gallops.  ABDOMEN: Soft, non-tender, non-distended. Bowel sounds present. No organomegaly or mass.  EXTREMITIES: No pedal edema, cyanosis, or clubbing.  NEUROLOGIC: Cranial nerves II through XII are intact. Muscle strength 5/5 in all extremities. Sensation intact. Gait not checked.  PSYCHIATRIC: The patient is alert and oriented x 3.  SKIN: No obvious rash, lesion, or ulcer.  DATA  REVIEW:   CBC Recent Labs  Lab 01/23/20 0843  WBC 10.8*  HGB 12.7  HCT 38.8  PLT 363    Chemistries  Recent Labs  Lab 01/23/20 0843  NA 138  K 3.4*  CL 104  CO2 25  GLUCOSE 182*  BUN 16  CREATININE 1.72*  CALCIUM 7.8*  MG 1.9  AST 17  ALT 11  ALKPHOS 82  BILITOT 0.5      Follow-up Information    Kirk Ruths, MD. Schedule an appointment as soon as possible for a visit in 1 week(s).   Specialty: Internal Medicine Contact information: [place] [address] [phone]            Management plans discussed with the patient, family and they are in agreement.  CODE STATUS: Prior   TOTAL TIME TAKING CARE OF THIS PATIENT: 45 minutes.    Max Sane M.D on 01/26/2020 at 2:54 PM  [facility]   CC: Primary  care physician; Kirk Ruths, MD   Note: This dictation was prepared with Dragon dictation along with smaller phrase technology. Any transcriptional errors that result from this process are unintentional.

## 2020-01-29 ENCOUNTER — Other Ambulatory Visit: Payer: Self-pay | Admitting: *Deleted

## 2020-01-29 DIAGNOSIS — I10 Essential (primary) hypertension: Secondary | ICD-10-CM | POA: Diagnosis not present

## 2020-01-29 DIAGNOSIS — Z794 Long term (current) use of insulin: Secondary | ICD-10-CM | POA: Diagnosis not present

## 2020-01-29 DIAGNOSIS — I2510 Atherosclerotic heart disease of native coronary artery without angina pectoris: Secondary | ICD-10-CM | POA: Diagnosis not present

## 2020-01-29 DIAGNOSIS — E1165 Type 2 diabetes mellitus with hyperglycemia: Secondary | ICD-10-CM | POA: Diagnosis not present

## 2020-01-29 DIAGNOSIS — N390 Urinary tract infection, site not specified: Secondary | ICD-10-CM | POA: Diagnosis not present

## 2020-01-29 DIAGNOSIS — H5462 Unqualified visual loss, left eye, normal vision right eye: Secondary | ICD-10-CM | POA: Diagnosis not present

## 2020-01-29 DIAGNOSIS — Z7982 Long term (current) use of aspirin: Secondary | ICD-10-CM | POA: Diagnosis not present

## 2020-01-29 DIAGNOSIS — I252 Old myocardial infarction: Secondary | ICD-10-CM | POA: Diagnosis not present

## 2020-01-29 DIAGNOSIS — E785 Hyperlipidemia, unspecified: Secondary | ICD-10-CM | POA: Diagnosis not present

## 2020-01-29 NOTE — Patient Outreach (Signed)
[place] [facility]) Care Management  01/29/2020  Terry Arroyo 08-14-1961 [id number]   EMMI - General Discharge Red on EMMI Alert  Day : 4 Date: 01/28/20 Red Alert Reason: " Transportation to follow up ?" NO ", Other questions problems? " Yes"   Outreach Attempt #1 Subjective:  Outreach call to patient ,no answer able to leave a HIPAA compliant message for return call.     Plan Will plan return call in the next 4 business days Will send patient unsuccessful outreach letter    Joylene Draft, RN, BSN  [place] Management Coordinator  [phone]- Mobile [phone]- [place]

## 2020-01-30 DIAGNOSIS — Z7982 Long term (current) use of aspirin: Secondary | ICD-10-CM | POA: Diagnosis not present

## 2020-01-30 DIAGNOSIS — N390 Urinary tract infection, site not specified: Secondary | ICD-10-CM | POA: Diagnosis not present

## 2020-01-30 DIAGNOSIS — I252 Old myocardial infarction: Secondary | ICD-10-CM | POA: Diagnosis not present

## 2020-01-30 DIAGNOSIS — E1165 Type 2 diabetes mellitus with hyperglycemia: Secondary | ICD-10-CM | POA: Diagnosis not present

## 2020-01-30 DIAGNOSIS — Z794 Long term (current) use of insulin: Secondary | ICD-10-CM | POA: Diagnosis not present

## 2020-01-30 DIAGNOSIS — E785 Hyperlipidemia, unspecified: Secondary | ICD-10-CM | POA: Diagnosis not present

## 2020-01-30 DIAGNOSIS — I2510 Atherosclerotic heart disease of native coronary artery without angina pectoris: Secondary | ICD-10-CM | POA: Diagnosis not present

## 2020-01-30 DIAGNOSIS — H5462 Unqualified visual loss, left eye, normal vision right eye: Secondary | ICD-10-CM | POA: Diagnosis not present

## 2020-01-30 DIAGNOSIS — I10 Essential (primary) hypertension: Secondary | ICD-10-CM | POA: Diagnosis not present

## 2020-01-31 DIAGNOSIS — R399 Unspecified symptoms and signs involving the genitourinary system: Secondary | ICD-10-CM | POA: Diagnosis not present

## 2020-01-31 DIAGNOSIS — R8290 Unspecified abnormal findings in urine: Secondary | ICD-10-CM | POA: Diagnosis not present

## 2020-01-31 DIAGNOSIS — R6889 Other general symptoms and signs: Secondary | ICD-10-CM | POA: Diagnosis not present

## 2020-02-01 ENCOUNTER — Other Ambulatory Visit: Payer: Self-pay | Admitting: *Deleted

## 2020-02-01 NOTE — Patient Outreach (Signed)
[facility] [facility]) Care Management  02/01/2020  Terry Arroyo Jul 29, 1961 [id number]   EMMI - General Discharge Red on EMMI Alert   Day : 4 Date: 01/28/20 Red Alert Reason: " Transportation to follow up ?" NO ", Other questions problems? " Yes"   Outreach attempt #2 Subjective : Successful outreach call to patient, explained reason for the call and EMMI voice activated response call.Reviewed red alerts with patient.She reports using [employer] transportation and is aware of needing to schedule ride 3 days prior to appointment.  She reports having all medications from discharge and taking as prescribed. She contacted PCP office due to UTI symptoms went for urine specimen collection has received new prescription for antibiotic that she obtained at local pharmacy and started taking .  Reinforced adequate fluid intake reports she drinks plenty of water. She denies chest pain shortness of breath. She reports monitoring her blood pressure at home and keeping a record reading today 153/70. She reports monitoring her blood sugar and reading to 134 today keeping record to take to  next medical appointments. She discussed having PCP visit scheduled as well as cardiology visit She has home health services with nursing and physical therapy with [facility].  Patient living at home with her 15 year father that is independent,drives  and is able to provide transportation if needed.  Patient reports having [employer] Medicare and Medicaid and is active [facility] phone visits. Patient denies any other Care Management needs, care questions or concerns at this time.   Plan Will close case at this time, patient enrolled in external program, [employer] Dual Special needs program.    Joylene Draft, RN, BSN  [place] Management Coordinator  [phone]- Mobile [phone]- [place]

## 2020-02-05 DIAGNOSIS — N390 Urinary tract infection, site not specified: Secondary | ICD-10-CM | POA: Diagnosis not present

## 2020-02-05 DIAGNOSIS — E785 Hyperlipidemia, unspecified: Secondary | ICD-10-CM | POA: Diagnosis not present

## 2020-02-05 DIAGNOSIS — H5462 Unqualified visual loss, left eye, normal vision right eye: Secondary | ICD-10-CM | POA: Diagnosis not present

## 2020-02-05 DIAGNOSIS — Z794 Long term (current) use of insulin: Secondary | ICD-10-CM | POA: Diagnosis not present

## 2020-02-05 DIAGNOSIS — E1165 Type 2 diabetes mellitus with hyperglycemia: Secondary | ICD-10-CM | POA: Diagnosis not present

## 2020-02-05 DIAGNOSIS — I252 Old myocardial infarction: Secondary | ICD-10-CM | POA: Diagnosis not present

## 2020-02-05 DIAGNOSIS — I10 Essential (primary) hypertension: Secondary | ICD-10-CM | POA: Diagnosis not present

## 2020-02-05 DIAGNOSIS — I2510 Atherosclerotic heart disease of native coronary artery without angina pectoris: Secondary | ICD-10-CM | POA: Diagnosis not present

## 2020-02-05 DIAGNOSIS — Z7982 Long term (current) use of aspirin: Secondary | ICD-10-CM | POA: Diagnosis not present

## 2020-02-06 DIAGNOSIS — Z794 Long term (current) use of insulin: Secondary | ICD-10-CM | POA: Diagnosis not present

## 2020-02-06 DIAGNOSIS — E1165 Type 2 diabetes mellitus with hyperglycemia: Secondary | ICD-10-CM | POA: Diagnosis not present

## 2020-02-06 DIAGNOSIS — I10 Essential (primary) hypertension: Secondary | ICD-10-CM | POA: Diagnosis not present

## 2020-02-06 DIAGNOSIS — H5462 Unqualified visual loss, left eye, normal vision right eye: Secondary | ICD-10-CM | POA: Diagnosis not present

## 2020-02-06 DIAGNOSIS — E785 Hyperlipidemia, unspecified: Secondary | ICD-10-CM | POA: Diagnosis not present

## 2020-02-06 DIAGNOSIS — Z7982 Long term (current) use of aspirin: Secondary | ICD-10-CM | POA: Diagnosis not present

## 2020-02-06 DIAGNOSIS — I2510 Atherosclerotic heart disease of native coronary artery without angina pectoris: Secondary | ICD-10-CM | POA: Diagnosis not present

## 2020-02-06 DIAGNOSIS — N390 Urinary tract infection, site not specified: Secondary | ICD-10-CM | POA: Diagnosis not present

## 2020-02-06 DIAGNOSIS — I252 Old myocardial infarction: Secondary | ICD-10-CM | POA: Diagnosis not present

## 2020-02-11 DIAGNOSIS — E785 Hyperlipidemia, unspecified: Secondary | ICD-10-CM | POA: Diagnosis not present

## 2020-02-11 DIAGNOSIS — I252 Old myocardial infarction: Secondary | ICD-10-CM | POA: Diagnosis not present

## 2020-02-11 DIAGNOSIS — N390 Urinary tract infection, site not specified: Secondary | ICD-10-CM | POA: Diagnosis not present

## 2020-02-11 DIAGNOSIS — I2510 Atherosclerotic heart disease of native coronary artery without angina pectoris: Secondary | ICD-10-CM | POA: Diagnosis not present

## 2020-02-11 DIAGNOSIS — E1165 Type 2 diabetes mellitus with hyperglycemia: Secondary | ICD-10-CM | POA: Diagnosis not present

## 2020-02-11 DIAGNOSIS — H5462 Unqualified visual loss, left eye, normal vision right eye: Secondary | ICD-10-CM | POA: Diagnosis not present

## 2020-02-11 DIAGNOSIS — I10 Essential (primary) hypertension: Secondary | ICD-10-CM | POA: Diagnosis not present

## 2020-02-13 DIAGNOSIS — N390 Urinary tract infection, site not specified: Secondary | ICD-10-CM | POA: Diagnosis not present

## 2020-02-13 DIAGNOSIS — I252 Old myocardial infarction: Secondary | ICD-10-CM | POA: Diagnosis not present

## 2020-02-13 DIAGNOSIS — H5462 Unqualified visual loss, left eye, normal vision right eye: Secondary | ICD-10-CM | POA: Diagnosis not present

## 2020-02-13 DIAGNOSIS — E1165 Type 2 diabetes mellitus with hyperglycemia: Secondary | ICD-10-CM | POA: Diagnosis not present

## 2020-02-13 DIAGNOSIS — Z794 Long term (current) use of insulin: Secondary | ICD-10-CM | POA: Diagnosis not present

## 2020-02-13 DIAGNOSIS — I2510 Atherosclerotic heart disease of native coronary artery without angina pectoris: Secondary | ICD-10-CM | POA: Diagnosis not present

## 2020-02-13 DIAGNOSIS — I10 Essential (primary) hypertension: Secondary | ICD-10-CM | POA: Diagnosis not present

## 2020-02-13 DIAGNOSIS — E785 Hyperlipidemia, unspecified: Secondary | ICD-10-CM | POA: Diagnosis not present

## 2020-02-13 DIAGNOSIS — Z7982 Long term (current) use of aspirin: Secondary | ICD-10-CM | POA: Diagnosis not present

## 2020-02-15 DIAGNOSIS — I10 Essential (primary) hypertension: Secondary | ICD-10-CM | POA: Diagnosis not present

## 2020-02-15 DIAGNOSIS — I2510 Atherosclerotic heart disease of native coronary artery without angina pectoris: Secondary | ICD-10-CM | POA: Diagnosis not present

## 2020-02-15 DIAGNOSIS — H5462 Unqualified visual loss, left eye, normal vision right eye: Secondary | ICD-10-CM | POA: Diagnosis not present

## 2020-02-15 DIAGNOSIS — Z794 Long term (current) use of insulin: Secondary | ICD-10-CM | POA: Diagnosis not present

## 2020-02-15 DIAGNOSIS — E785 Hyperlipidemia, unspecified: Secondary | ICD-10-CM | POA: Diagnosis not present

## 2020-02-15 DIAGNOSIS — E1165 Type 2 diabetes mellitus with hyperglycemia: Secondary | ICD-10-CM | POA: Diagnosis not present

## 2020-02-15 DIAGNOSIS — N390 Urinary tract infection, site not specified: Secondary | ICD-10-CM | POA: Diagnosis not present

## 2020-02-15 DIAGNOSIS — Z7982 Long term (current) use of aspirin: Secondary | ICD-10-CM | POA: Diagnosis not present

## 2020-02-15 DIAGNOSIS — I252 Old myocardial infarction: Secondary | ICD-10-CM | POA: Diagnosis not present

## 2020-02-18 DIAGNOSIS — I10 Essential (primary) hypertension: Secondary | ICD-10-CM | POA: Diagnosis not present

## 2020-02-18 DIAGNOSIS — M5489 Other dorsalgia: Secondary | ICD-10-CM | POA: Diagnosis not present

## 2020-02-18 DIAGNOSIS — Z794 Long term (current) use of insulin: Secondary | ICD-10-CM | POA: Diagnosis not present

## 2020-02-18 DIAGNOSIS — J42 Unspecified chronic bronchitis: Secondary | ICD-10-CM | POA: Diagnosis not present

## 2020-02-18 DIAGNOSIS — I25118 Atherosclerotic heart disease of native coronary artery with other forms of angina pectoris: Secondary | ICD-10-CM | POA: Diagnosis not present

## 2020-02-18 DIAGNOSIS — Z87891 Personal history of nicotine dependence: Secondary | ICD-10-CM | POA: Diagnosis not present

## 2020-02-18 DIAGNOSIS — E1159 Type 2 diabetes mellitus with other circulatory complications: Secondary | ICD-10-CM | POA: Diagnosis not present

## 2020-02-18 DIAGNOSIS — F330 Major depressive disorder, recurrent, mild: Secondary | ICD-10-CM | POA: Diagnosis not present

## 2020-02-18 DIAGNOSIS — G4733 Obstructive sleep apnea (adult) (pediatric): Secondary | ICD-10-CM | POA: Diagnosis not present

## 2020-02-18 DIAGNOSIS — M549 Dorsalgia, unspecified: Secondary | ICD-10-CM | POA: Diagnosis not present

## 2020-02-18 DIAGNOSIS — I700 Atherosclerosis of aorta: Secondary | ICD-10-CM | POA: Insufficient documentation

## 2020-02-21 DIAGNOSIS — Z794 Long term (current) use of insulin: Secondary | ICD-10-CM | POA: Diagnosis not present

## 2020-02-21 DIAGNOSIS — E1165 Type 2 diabetes mellitus with hyperglycemia: Secondary | ICD-10-CM | POA: Diagnosis not present

## 2020-02-21 DIAGNOSIS — H5462 Unqualified visual loss, left eye, normal vision right eye: Secondary | ICD-10-CM | POA: Diagnosis not present

## 2020-02-21 DIAGNOSIS — I2510 Atherosclerotic heart disease of native coronary artery without angina pectoris: Secondary | ICD-10-CM | POA: Diagnosis not present

## 2020-02-21 DIAGNOSIS — N390 Urinary tract infection, site not specified: Secondary | ICD-10-CM | POA: Diagnosis not present

## 2020-02-21 DIAGNOSIS — I252 Old myocardial infarction: Secondary | ICD-10-CM | POA: Diagnosis not present

## 2020-02-21 DIAGNOSIS — Z7982 Long term (current) use of aspirin: Secondary | ICD-10-CM | POA: Diagnosis not present

## 2020-02-21 DIAGNOSIS — E785 Hyperlipidemia, unspecified: Secondary | ICD-10-CM | POA: Diagnosis not present

## 2020-02-21 DIAGNOSIS — I10 Essential (primary) hypertension: Secondary | ICD-10-CM | POA: Diagnosis not present

## 2020-02-25 DIAGNOSIS — I10 Essential (primary) hypertension: Secondary | ICD-10-CM | POA: Diagnosis not present

## 2020-02-25 DIAGNOSIS — H5462 Unqualified visual loss, left eye, normal vision right eye: Secondary | ICD-10-CM | POA: Diagnosis not present

## 2020-02-25 DIAGNOSIS — E1165 Type 2 diabetes mellitus with hyperglycemia: Secondary | ICD-10-CM | POA: Diagnosis not present

## 2020-02-25 DIAGNOSIS — I252 Old myocardial infarction: Secondary | ICD-10-CM | POA: Diagnosis not present

## 2020-02-25 DIAGNOSIS — I2510 Atherosclerotic heart disease of native coronary artery without angina pectoris: Secondary | ICD-10-CM | POA: Diagnosis not present

## 2020-02-25 DIAGNOSIS — Z794 Long term (current) use of insulin: Secondary | ICD-10-CM | POA: Diagnosis not present

## 2020-02-25 DIAGNOSIS — Z7982 Long term (current) use of aspirin: Secondary | ICD-10-CM | POA: Diagnosis not present

## 2020-02-25 DIAGNOSIS — E785 Hyperlipidemia, unspecified: Secondary | ICD-10-CM | POA: Diagnosis not present

## 2020-02-25 DIAGNOSIS — N390 Urinary tract infection, site not specified: Secondary | ICD-10-CM | POA: Diagnosis not present

## 2020-02-26 ENCOUNTER — Other Ambulatory Visit: Payer: Self-pay

## 2020-02-26 ENCOUNTER — Emergency Department: Payer: Medicare HMO

## 2020-02-26 ENCOUNTER — Observation Stay: Payer: Medicare HMO

## 2020-02-26 ENCOUNTER — Inpatient Hospital Stay
Admission: EM | Admit: 2020-02-26 | Discharge: 2020-03-02 | DRG: 291 | Disposition: A | Discharge status: Home / Self Care | Payer: Medicare HMO | Attending: Internal Medicine | Admitting: Internal Medicine

## 2020-02-26 DIAGNOSIS — R079 Chest pain, unspecified: Secondary | ICD-10-CM | POA: Diagnosis not present

## 2020-02-26 DIAGNOSIS — T508X5A Adverse effect of diagnostic agents, initial encounter: Secondary | ICD-10-CM | POA: Diagnosis present

## 2020-02-26 DIAGNOSIS — I130 Hypertensive heart and chronic kidney disease with heart failure and stage 1 through stage 4 chronic kidney disease, or unspecified chronic kidney disease: Principal | ICD-10-CM | POA: Diagnosis present

## 2020-02-26 DIAGNOSIS — Z20822 Contact with and (suspected) exposure to covid-19: Secondary | ICD-10-CM | POA: Diagnosis not present

## 2020-02-26 DIAGNOSIS — N1832 Chronic kidney disease, stage 3b: Secondary | ICD-10-CM | POA: Diagnosis not present

## 2020-02-26 DIAGNOSIS — I160 Hypertensive urgency: Secondary | ICD-10-CM | POA: Diagnosis not present

## 2020-02-26 DIAGNOSIS — M79605 Pain in left leg: Secondary | ICD-10-CM | POA: Diagnosis not present

## 2020-02-26 DIAGNOSIS — Z7982 Long term (current) use of aspirin: Secondary | ICD-10-CM

## 2020-02-26 DIAGNOSIS — Z79899 Other long term (current) drug therapy: Secondary | ICD-10-CM | POA: Diagnosis not present

## 2020-02-26 DIAGNOSIS — I161 Hypertensive emergency: Secondary | ICD-10-CM

## 2020-02-26 DIAGNOSIS — I69354 Hemiplegia and hemiparesis following cerebral infarction affecting left non-dominant side: Secondary | ICD-10-CM

## 2020-02-26 DIAGNOSIS — E871 Hypo-osmolality and hyponatremia: Secondary | ICD-10-CM | POA: Diagnosis not present

## 2020-02-26 DIAGNOSIS — I25810 Atherosclerosis of coronary artery bypass graft(s) without angina pectoris: Secondary | ICD-10-CM | POA: Diagnosis not present

## 2020-02-26 DIAGNOSIS — Z87891 Personal history of nicotine dependence: Secondary | ICD-10-CM

## 2020-02-26 DIAGNOSIS — I5032 Chronic diastolic (congestive) heart failure: Secondary | ICD-10-CM | POA: Diagnosis not present

## 2020-02-26 DIAGNOSIS — N183 Chronic kidney disease, stage 3 unspecified: Secondary | ICD-10-CM

## 2020-02-26 DIAGNOSIS — I5021 Acute systolic (congestive) heart failure: Secondary | ICD-10-CM | POA: Diagnosis not present

## 2020-02-26 DIAGNOSIS — M79662 Pain in left lower leg: Secondary | ICD-10-CM | POA: Diagnosis not present

## 2020-02-26 DIAGNOSIS — Z794 Long term (current) use of insulin: Secondary | ICD-10-CM

## 2020-02-26 DIAGNOSIS — R601 Generalized edema: Secondary | ICD-10-CM | POA: Diagnosis present

## 2020-02-26 DIAGNOSIS — E669 Obesity, unspecified: Secondary | ICD-10-CM | POA: Diagnosis present

## 2020-02-26 DIAGNOSIS — G4733 Obstructive sleep apnea (adult) (pediatric): Secondary | ICD-10-CM | POA: Diagnosis present

## 2020-02-26 DIAGNOSIS — E785 Hyperlipidemia, unspecified: Secondary | ICD-10-CM | POA: Diagnosis present

## 2020-02-26 DIAGNOSIS — E1169 Type 2 diabetes mellitus with other specified complication: Secondary | ICD-10-CM

## 2020-02-26 DIAGNOSIS — I509 Heart failure, unspecified: Secondary | ICD-10-CM

## 2020-02-26 DIAGNOSIS — Z888 Allergy status to other drugs, medicaments and biological substances status: Secondary | ICD-10-CM

## 2020-02-26 DIAGNOSIS — N179 Acute kidney failure, unspecified: Secondary | ICD-10-CM | POA: Diagnosis not present

## 2020-02-26 DIAGNOSIS — N141 Nephropathy induced by other drugs, medicaments and biological substances: Secondary | ICD-10-CM | POA: Diagnosis present

## 2020-02-26 DIAGNOSIS — H5462 Unqualified visual loss, left eye, normal vision right eye: Secondary | ICD-10-CM | POA: Diagnosis present

## 2020-02-26 DIAGNOSIS — E1165 Type 2 diabetes mellitus with hyperglycemia: Secondary | ICD-10-CM | POA: Diagnosis present

## 2020-02-26 DIAGNOSIS — I5023 Acute on chronic systolic (congestive) heart failure: Secondary | ICD-10-CM | POA: Diagnosis present

## 2020-02-26 DIAGNOSIS — M7989 Other specified soft tissue disorders: Secondary | ICD-10-CM | POA: Diagnosis not present

## 2020-02-26 DIAGNOSIS — R109 Unspecified abdominal pain: Secondary | ICD-10-CM | POA: Diagnosis not present

## 2020-02-26 DIAGNOSIS — R600 Localized edema: Secondary | ICD-10-CM

## 2020-02-26 DIAGNOSIS — F329 Major depressive disorder, single episode, unspecified: Secondary | ICD-10-CM | POA: Diagnosis present

## 2020-02-26 DIAGNOSIS — I5043 Acute on chronic combined systolic (congestive) and diastolic (congestive) heart failure: Secondary | ICD-10-CM | POA: Diagnosis not present

## 2020-02-26 DIAGNOSIS — M79669 Pain in unspecified lower leg: Secondary | ICD-10-CM | POA: Diagnosis not present

## 2020-02-26 DIAGNOSIS — E1122 Type 2 diabetes mellitus with diabetic chronic kidney disease: Secondary | ICD-10-CM | POA: Diagnosis not present

## 2020-02-26 DIAGNOSIS — I071 Rheumatic tricuspid insufficiency: Secondary | ICD-10-CM | POA: Diagnosis present

## 2020-02-26 DIAGNOSIS — E876 Hypokalemia: Secondary | ICD-10-CM | POA: Diagnosis present

## 2020-02-26 DIAGNOSIS — Z91013 Allergy to seafood: Secondary | ICD-10-CM | POA: Diagnosis not present

## 2020-02-26 DIAGNOSIS — Y92239 Unspecified place in hospital as the place of occurrence of the external cause: Secondary | ICD-10-CM | POA: Diagnosis present

## 2020-02-26 DIAGNOSIS — M79604 Pain in right leg: Secondary | ICD-10-CM | POA: Diagnosis not present

## 2020-02-26 DIAGNOSIS — Z91018 Allergy to other foods: Secondary | ICD-10-CM | POA: Diagnosis not present

## 2020-02-26 DIAGNOSIS — Z951 Presence of aortocoronary bypass graft: Secondary | ICD-10-CM

## 2020-02-26 DIAGNOSIS — I2510 Atherosclerotic heart disease of native coronary artery without angina pectoris: Secondary | ICD-10-CM | POA: Diagnosis present

## 2020-02-26 DIAGNOSIS — Z6830 Body mass index (BMI) 30.0-30.9, adult: Secondary | ICD-10-CM

## 2020-02-26 DIAGNOSIS — I252 Old myocardial infarction: Secondary | ICD-10-CM

## 2020-02-26 DIAGNOSIS — M79661 Pain in right lower leg: Secondary | ICD-10-CM | POA: Diagnosis not present

## 2020-02-26 DIAGNOSIS — R0902 Hypoxemia: Secondary | ICD-10-CM | POA: Diagnosis present

## 2020-02-26 DIAGNOSIS — N1831 Chronic kidney disease, stage 3a: Secondary | ICD-10-CM | POA: Diagnosis not present

## 2020-02-26 DIAGNOSIS — R778 Other specified abnormalities of plasma proteins: Secondary | ICD-10-CM | POA: Diagnosis present

## 2020-02-26 DIAGNOSIS — I255 Ischemic cardiomyopathy: Secondary | ICD-10-CM | POA: Diagnosis present

## 2020-02-26 DIAGNOSIS — E8809 Other disorders of plasma-protein metabolism, not elsewhere classified: Secondary | ICD-10-CM | POA: Diagnosis present

## 2020-02-26 DIAGNOSIS — R809 Proteinuria, unspecified: Secondary | ICD-10-CM | POA: Diagnosis present

## 2020-02-26 DIAGNOSIS — Z8249 Family history of ischemic heart disease and other diseases of the circulatory system: Secondary | ICD-10-CM

## 2020-02-26 DIAGNOSIS — E119 Type 2 diabetes mellitus without complications: Secondary | ICD-10-CM

## 2020-02-26 DIAGNOSIS — I129 Hypertensive chronic kidney disease with stage 1 through stage 4 chronic kidney disease, or unspecified chronic kidney disease: Secondary | ICD-10-CM | POA: Diagnosis not present

## 2020-02-26 DIAGNOSIS — M5430 Sciatica, unspecified side: Secondary | ICD-10-CM | POA: Diagnosis present

## 2020-02-26 DIAGNOSIS — J449 Chronic obstructive pulmonary disease, unspecified: Secondary | ICD-10-CM | POA: Diagnosis present

## 2020-02-26 DIAGNOSIS — R0602 Shortness of breath: Secondary | ICD-10-CM | POA: Diagnosis not present

## 2020-02-26 DIAGNOSIS — N184 Chronic kidney disease, stage 4 (severe): Secondary | ICD-10-CM | POA: Diagnosis present

## 2020-02-26 HISTORY — DX: Generalized edema: R60.1

## 2020-02-26 HISTORY — DX: Acute on chronic systolic (congestive) heart failure: I50.23

## 2020-02-26 LAB — MAGNESIUM: Magnesium: 1.7 mg/dL (ref 1.7–2.4)

## 2020-02-26 LAB — HEPATIC FUNCTION PANEL
ALT: 11 U/L (ref 0–44)
AST: 16 U/L (ref 15–41)
Albumin: 2.4 g/dL — ABNORMAL LOW (ref 3.5–5.0)
Alkaline Phosphatase: 64 U/L (ref 38–126)
Bilirubin, Direct: <0.1 mg/dL (ref 0.0–0.2)
Total Bilirubin: 0.5 mg/dL (ref 0.3–1.2)
Total Protein: 6.5 g/dL (ref 6.5–8.1)

## 2020-02-26 LAB — CBC
HCT: 36.7 % (ref 36.0–46.0)
Hemoglobin: 12.1 g/dL (ref 12.0–15.0)
MCH: 28.3 pg (ref 26.0–34.0)
MCHC: 33 g/dL (ref 30.0–36.0)
MCV: 85.9 fL (ref 80.0–100.0)
Platelets: 369 10*3/uL (ref 150–400)
RBC: 4.27 MIL/uL (ref 3.87–5.11)
RDW: 14.6 % (ref 11.5–15.5)
WBC: 9.4 10*3/uL (ref 4.0–10.5)
nRBC: 0 % (ref 0.0–0.2)

## 2020-02-26 LAB — URINALYSIS, COMPLETE (UACMP) WITH MICROSCOPIC
Bacteria, UA: NONE SEEN
Bilirubin Urine: NEGATIVE
Glucose, UA: 150 mg/dL — AB
Hgb urine dipstick: NEGATIVE
Ketones, ur: NEGATIVE mg/dL
Leukocytes,Ua: NEGATIVE
Nitrite: NEGATIVE
Protein, ur: >=300 mg/dL — AB
Specific Gravity, Urine: 1.004 — ABNORMAL LOW (ref 1.005–1.030)
pH: 7 (ref 5.0–8.0)

## 2020-02-26 LAB — BASIC METABOLIC PANEL
Anion gap: 13 (ref 5–15)
BUN: 14 mg/dL (ref 6–20)
CO2: 22 mmol/L (ref 22–32)
Calcium: 8.5 mg/dL — ABNORMAL LOW (ref 8.9–10.3)
Chloride: 105 mmol/L (ref 98–111)
Creatinine, Ser: 1.74 mg/dL — ABNORMAL HIGH (ref 0.44–1.00)
GFR calc Af Amer: 37 mL/min — ABNORMAL LOW (ref >60)
GFR calc non Af Amer: 32 mL/min — ABNORMAL LOW (ref >60)
Glucose, Bld: 257 mg/dL — ABNORMAL HIGH (ref 70–99)
Potassium: 2.8 mmol/L — ABNORMAL LOW (ref 3.5–5.1)
Sodium: 140 mmol/L (ref 135–145)

## 2020-02-26 LAB — PHOSPHORUS: Phosphorus: 4.2 mg/dL (ref 2.5–4.6)

## 2020-02-26 LAB — URINE DRUG SCREEN, QUALITATIVE (ARMC ONLY)
Amphetamines, Ur Screen: NOT DETECTED
Barbiturates, Ur Screen: NOT DETECTED
Benzodiazepine, Ur Scrn: NOT DETECTED
Cannabinoid 50 Ng, Ur ~~LOC~~: NOT DETECTED
Cocaine Metabolite,Ur ~~LOC~~: NOT DETECTED
MDMA (Ecstasy)Ur Screen: NOT DETECTED
Methadone Scn, Ur: NOT DETECTED
Opiate, Ur Screen: NOT DETECTED
Phencyclidine (PCP) Ur S: NOT DETECTED
Tricyclic, Ur Screen: NOT DETECTED

## 2020-02-26 LAB — GLUCOSE, CAPILLARY
Glucose-Capillary: 192 mg/dL — ABNORMAL HIGH (ref 70–99)
Glucose-Capillary: 212 mg/dL — ABNORMAL HIGH (ref 70–99)

## 2020-02-26 LAB — BRAIN NATRIURETIC PEPTIDE: B Natriuretic Peptide: 610 pg/mL — ABNORMAL HIGH (ref 0.0–100.0)

## 2020-02-26 LAB — TROPONIN I (HIGH SENSITIVITY)
Troponin I (High Sensitivity): 46 ng/L — ABNORMAL HIGH (ref <18)
Troponin I (High Sensitivity): 44 ng/L — ABNORMAL HIGH (ref <18)

## 2020-02-26 LAB — PROTIME-INR
INR: 0.9 (ref 0.8–1.2)
Prothrombin Time: 11.9 seconds (ref 11.4–15.2)

## 2020-02-26 LAB — FIBRIN DERIVATIVES D-DIMER (ARMC ONLY): Fibrin derivatives D-dimer (ARMC): 4885.57 ng/mL (FEU) — ABNORMAL HIGH (ref 0.00–499.00)

## 2020-02-26 IMAGING — US US RENAL
1 series · 14 of 25 positions shown · non-contrast
Comparison: CTA chest today and [DATE].

CLINICAL DATA: 58-year-old female with flank pain.

EXAM:
RENAL / URINARY TRACT ULTRASOUND COMPLETE

[Series 1: us renal · 14 of 54 slices shown]
[im 1/54]
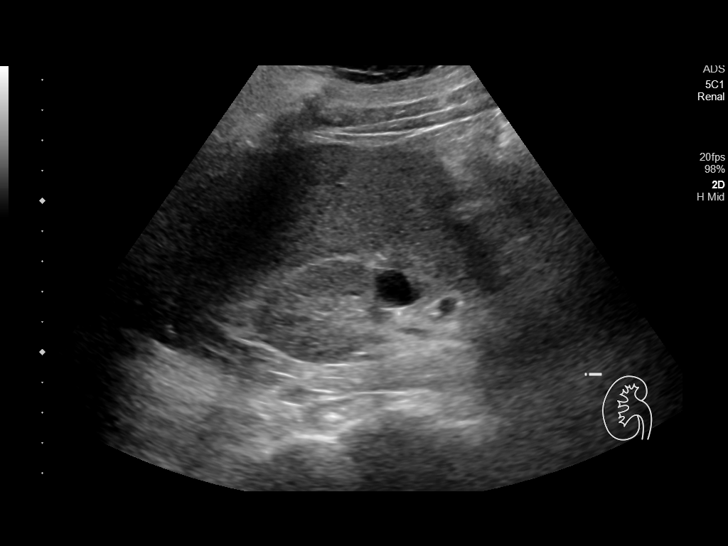
[im 5/54]
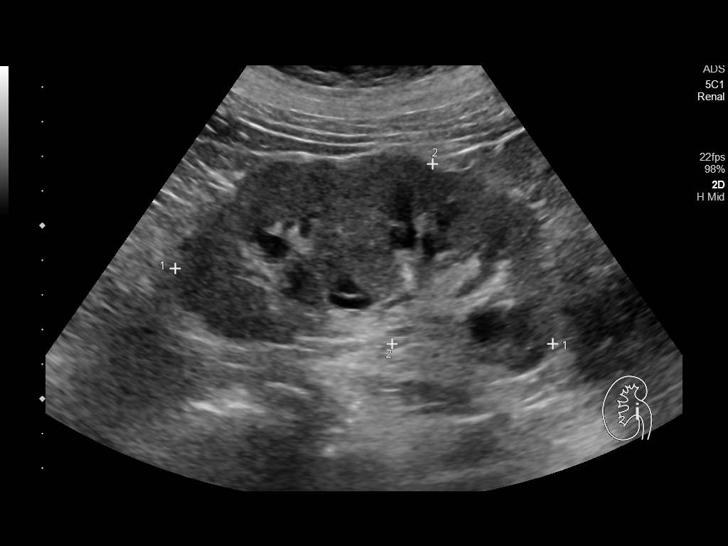
[im 9/54]
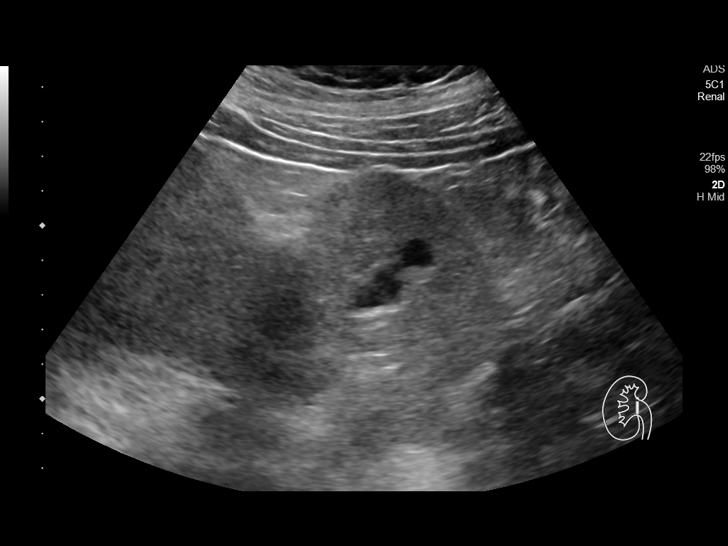
[im 14/54]
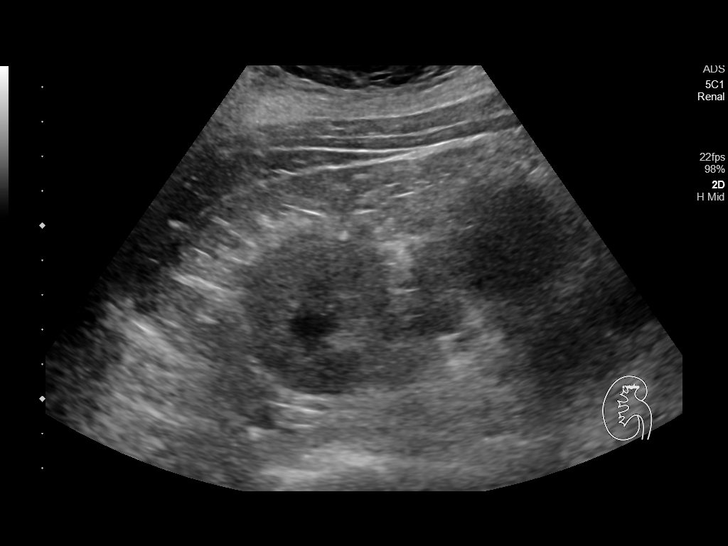
[im 18/54]
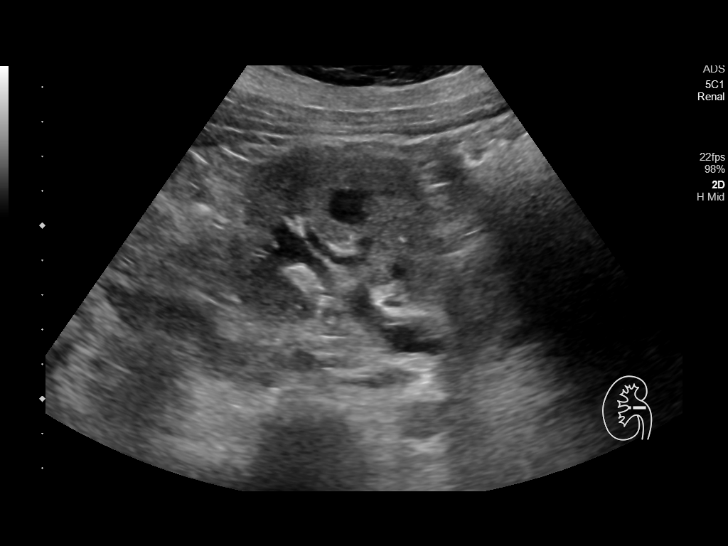
[im 20/54]
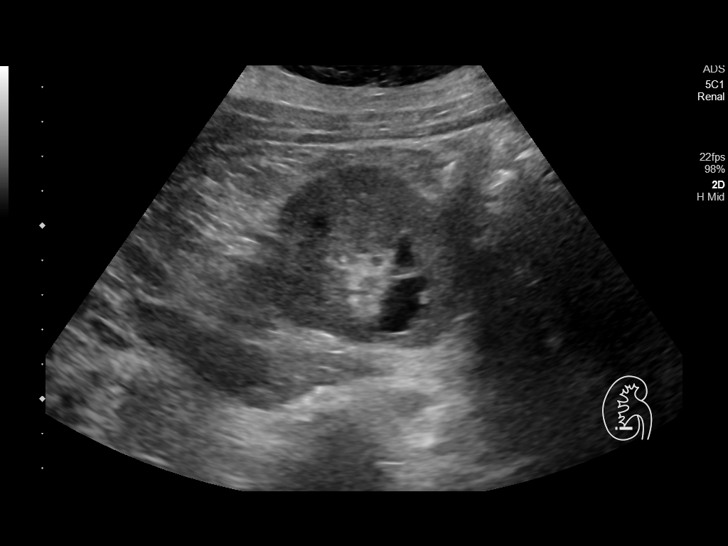
[im 25/54]
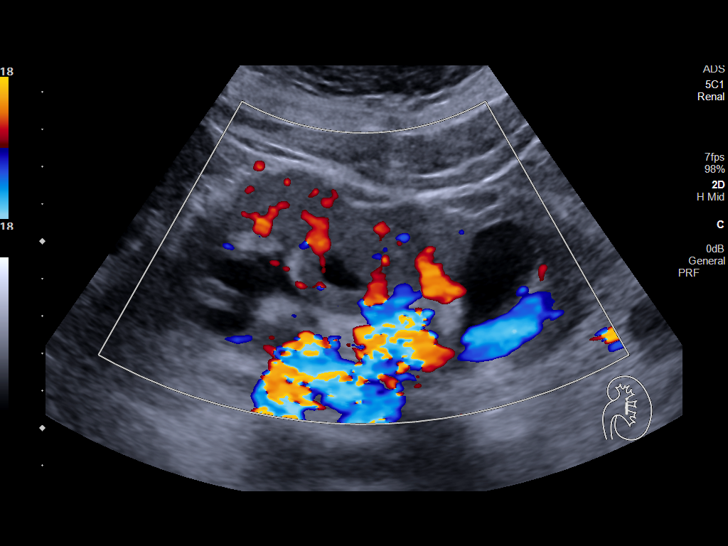
[im 29/54]
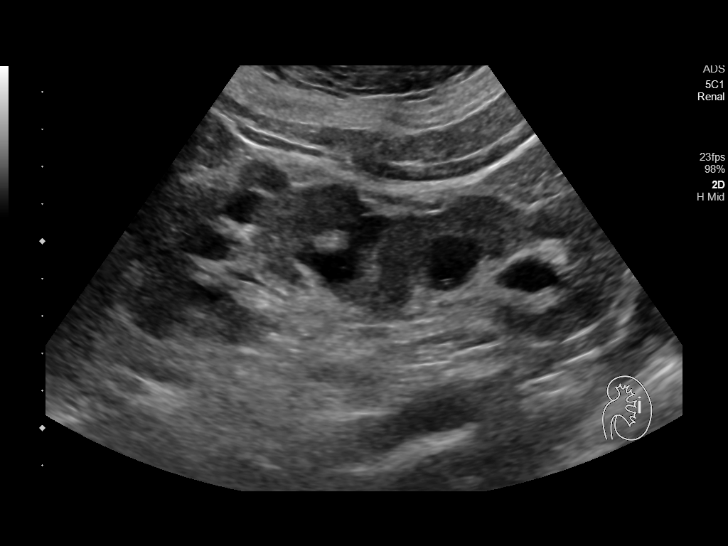
[im 34/54]
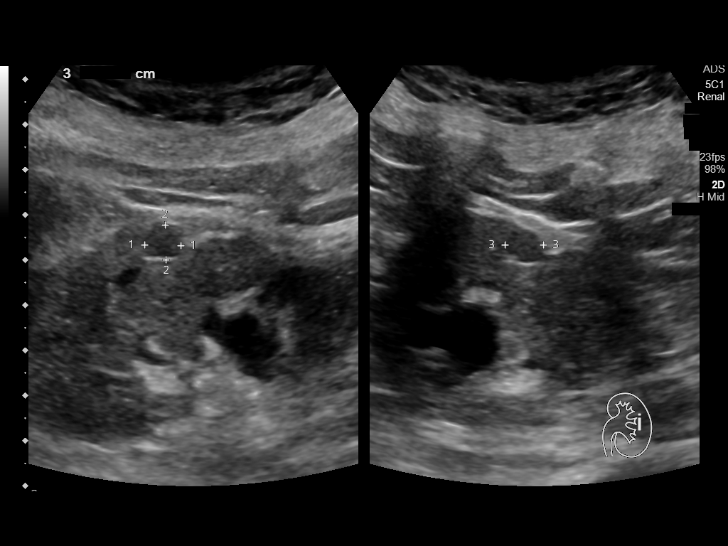
[im 36/54]
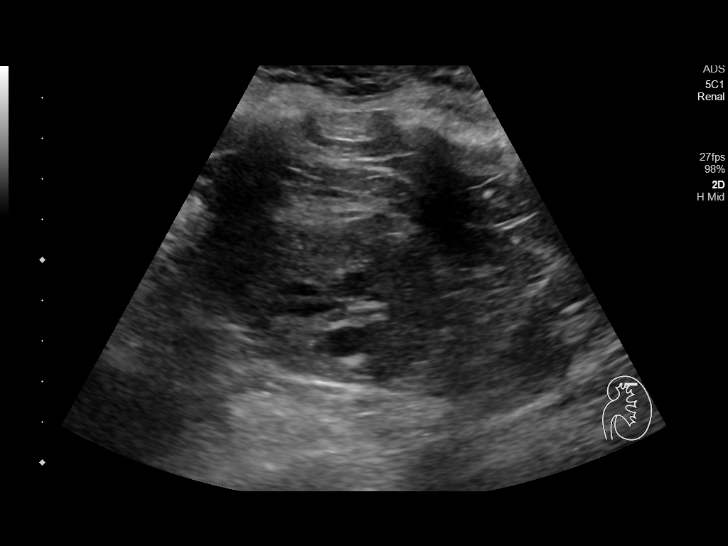
[im 40/54]
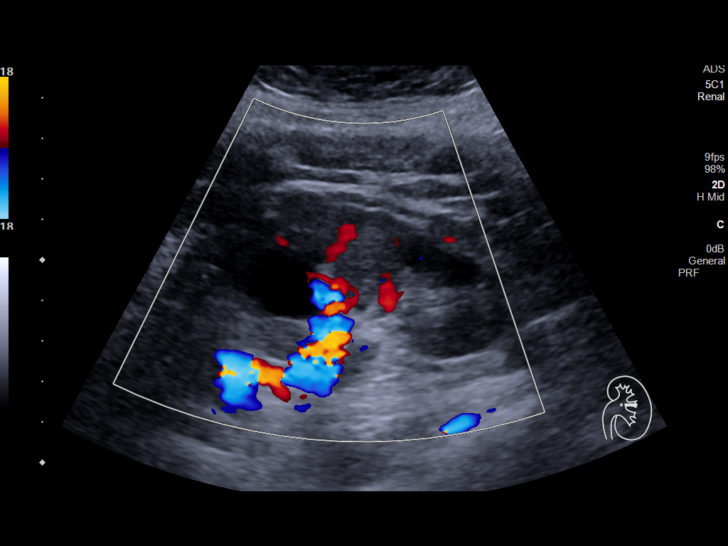
[im 45/54]
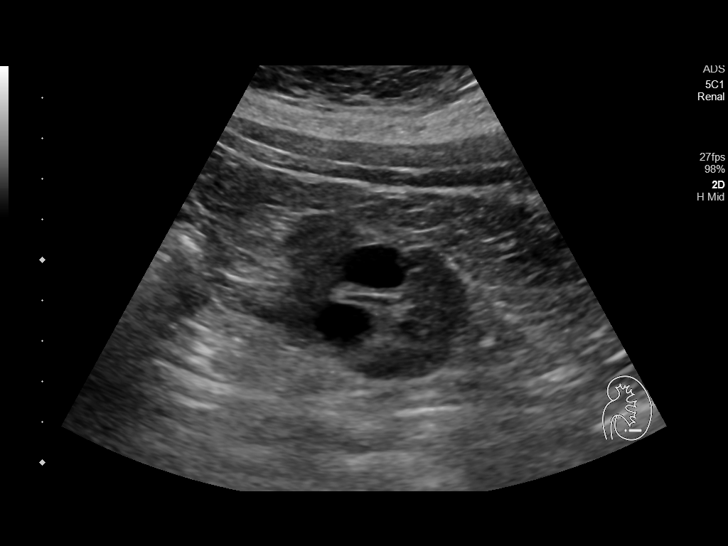
[im 49/54]
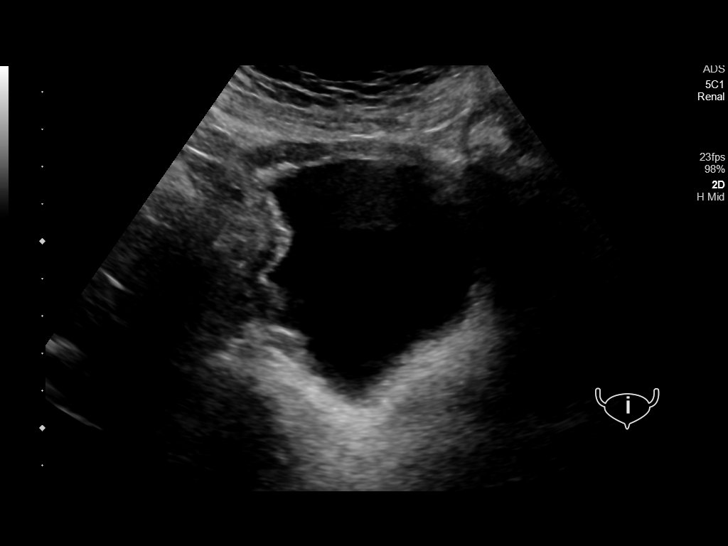
[im 54/54]
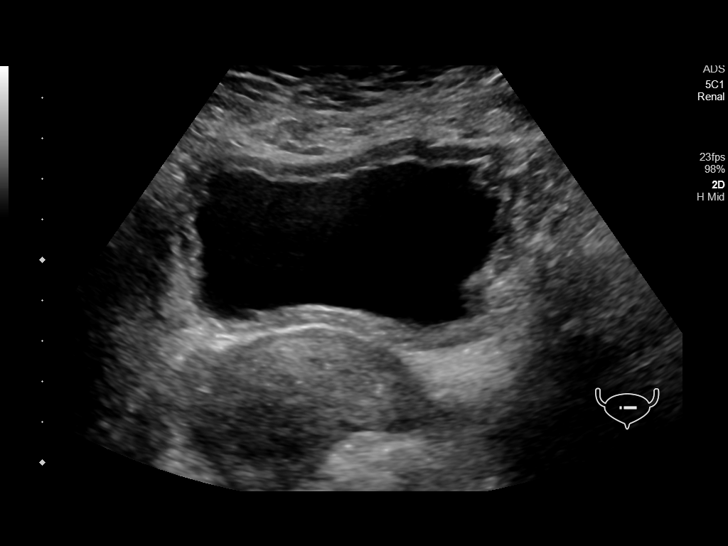

[14 of 25 positions shown; findings below may reference images not displayed]

FINDINGS: Right Kidney:

Renal measurements: 11.1 x 5.3 x 6.9 cm = volume: 214 mL. Mild
persistent fetal lobulations of the right kidney. Cortical
echogenicity within normal limits. There is some prominence of the
intrarenal collecting system (image 7), but no overt hydronephrosis.
Possible nephrolithiasis at the midpole on image 9.

Left Kidney:

Renal measurements: 11.8 x 4.7 x 3.6 cm = volume: 106 mL. Similar
lobulations to the right kidney. Cortical echogenicity within normal
limits. Suggestion of mild hydronephrosis (image 24 and 25). No
definite left renal lesion.

Bladder:

Diminutive, which probably explains the appearance of mild bladder
wall thickening (image 51).

Other:

None.
IMPRESSION: Questionable renal hydronephrosis greater on the left. Possible
right nephrolithiasis.

If there is hematuria and/or acute renal insufficiency then
follow-up noncontrast CT Abdomen and Pelvis is recommended.
Otherwise a routine follow-up CT Urogram (with contrast) would be
most valuable.

## 2020-02-26 IMAGING — CR DG CHEST 2V
1 series · 2 of 2 positions shown · non-contrast
Comparison: [DATE]

CLINICAL DATA: Chest pain.

EXAM:
CHEST - 2 VIEW

[Series 1: dg chest 2 view · 0.14mm/px · 2 of 2 slices shown]
[im 1/2]
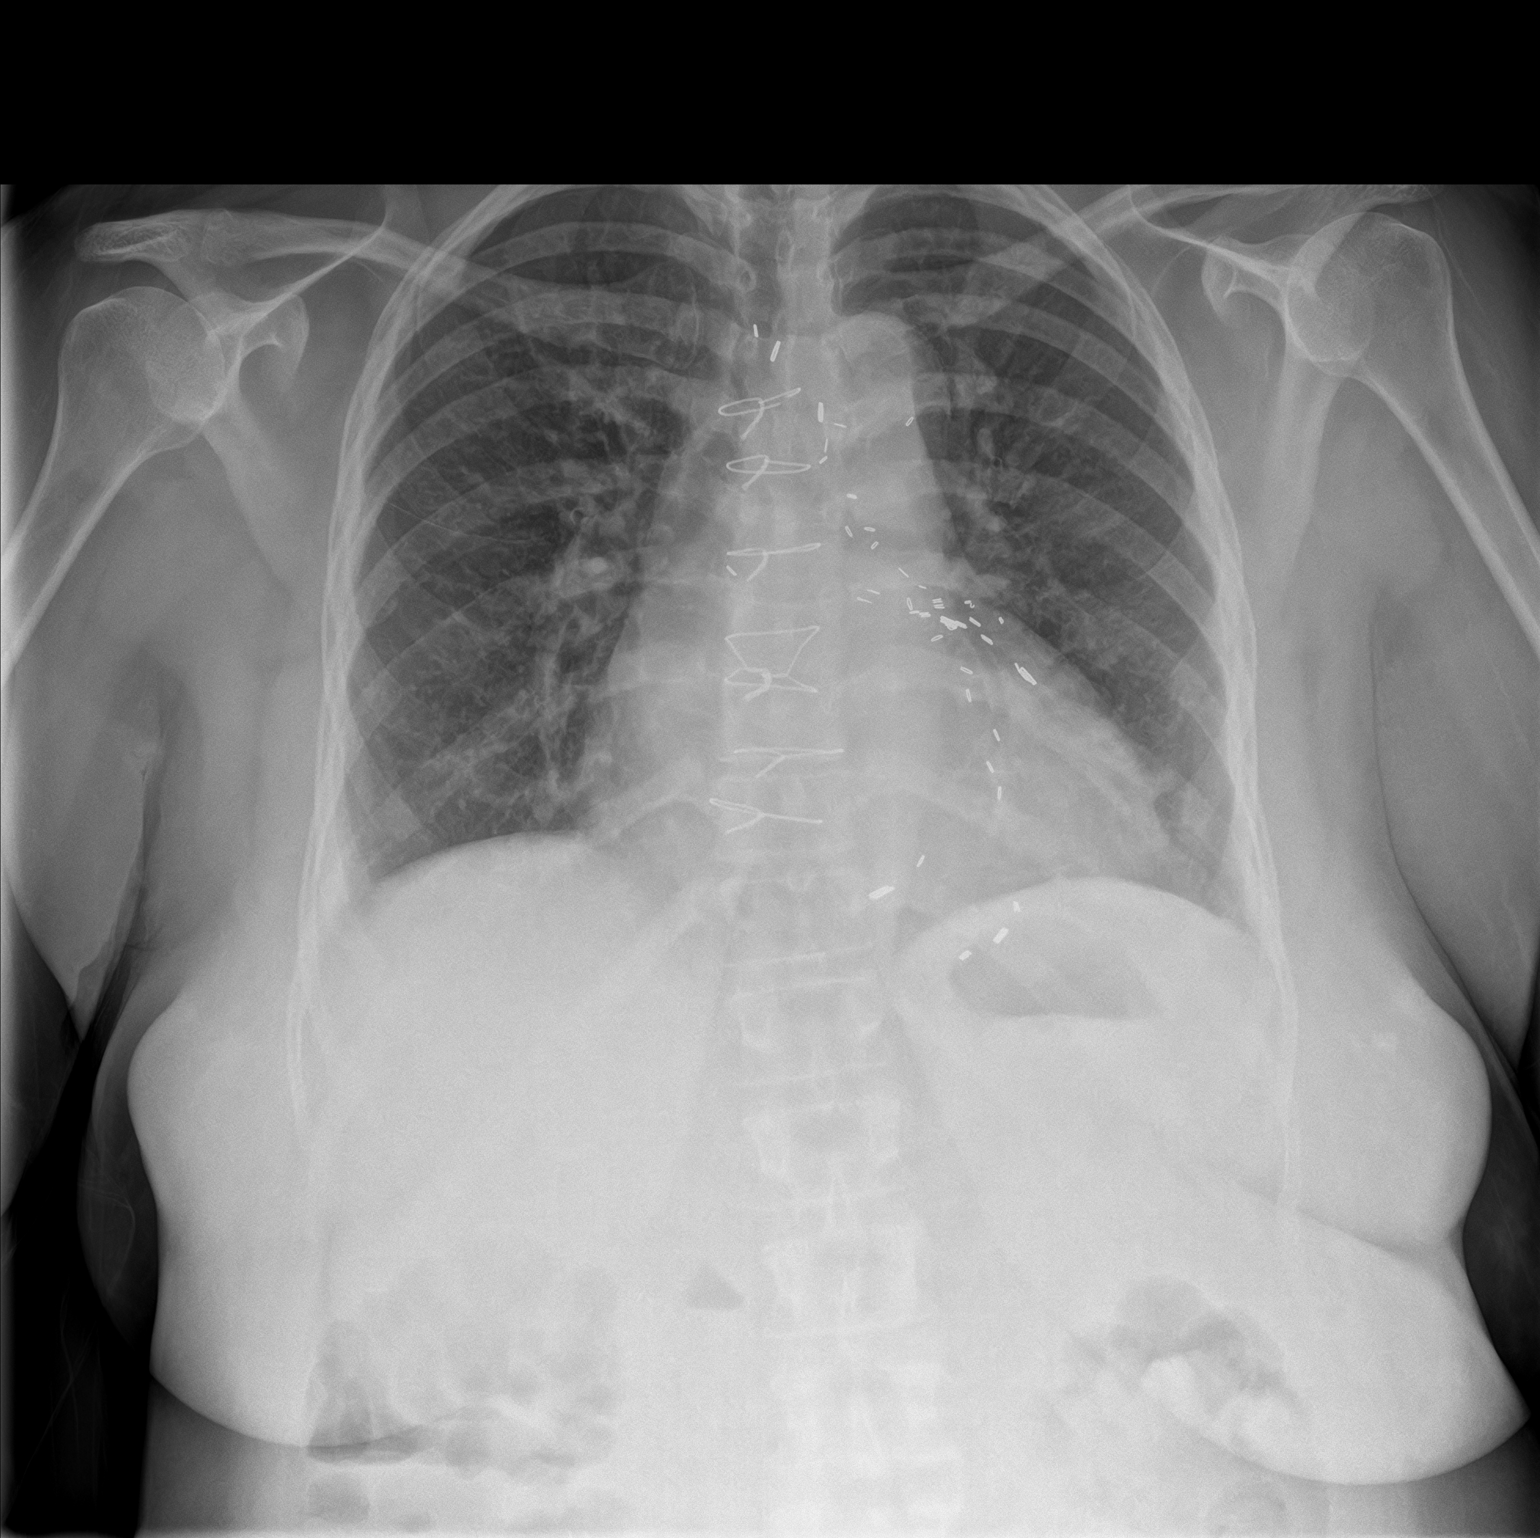
[im 2/2]
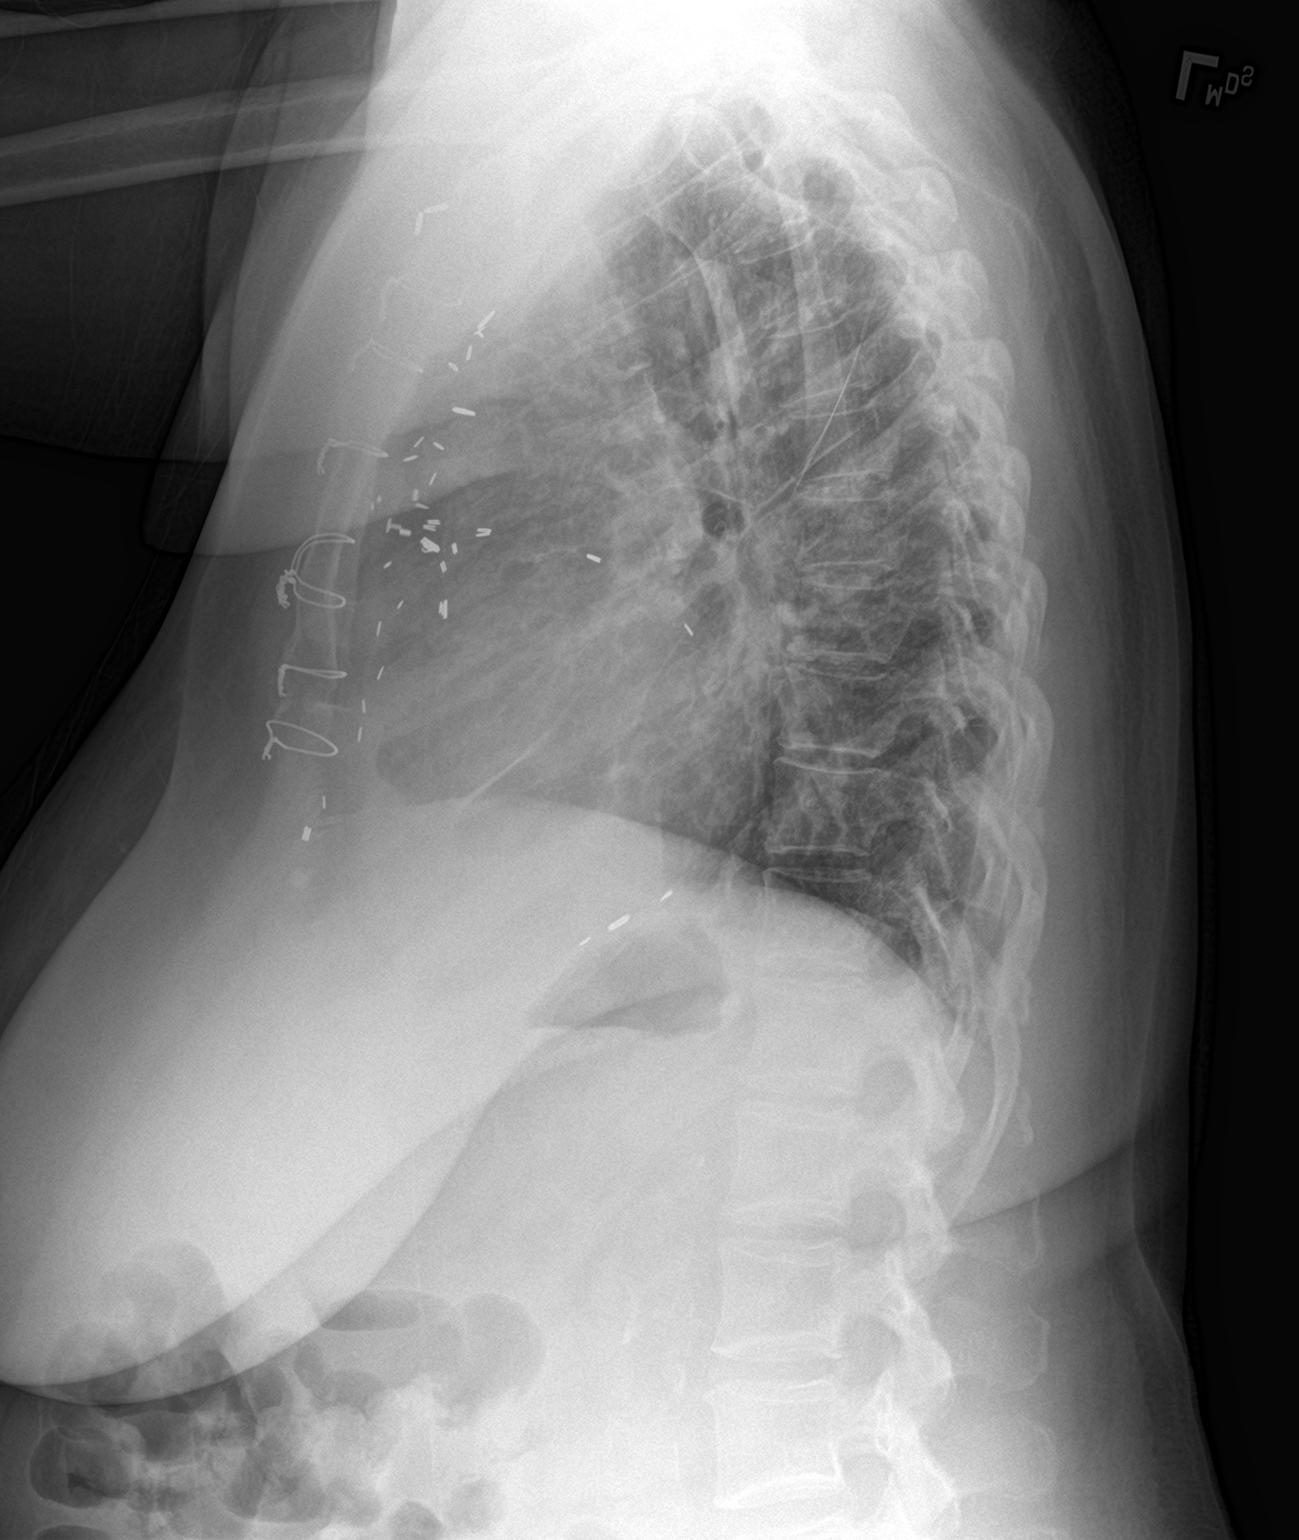

[2 of 2 positions shown; findings below may reference images not displayed]

FINDINGS: Stable mild cardiomegaly. The hila and mediastinum are normal. No
pneumothorax. No nodules or masses. No focal infiltrates or overt
edema. Mild pulmonary venous congestion suggested.
IMPRESSION: Possible mild pulmonary venous congestion. No other acute
abnormalities.

## 2020-02-26 IMAGING — US US EXTREM LOW VENOUS
1 series · 13 of 24 positions shown · non-contrast
Comparison: None.

CLINICAL DATA: Bilateral leg swelling



[Series 1: us extrem low venous · 0.07mm/px · 13 of 58 slices shown]
[im 1/58]
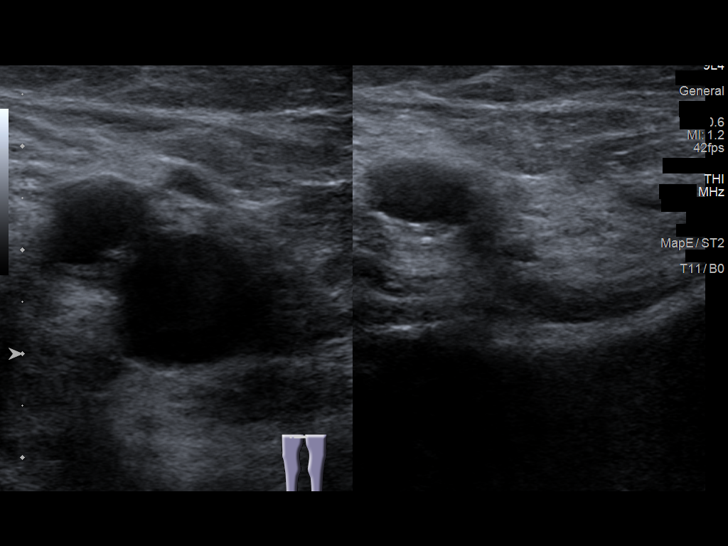
[im 5/58]
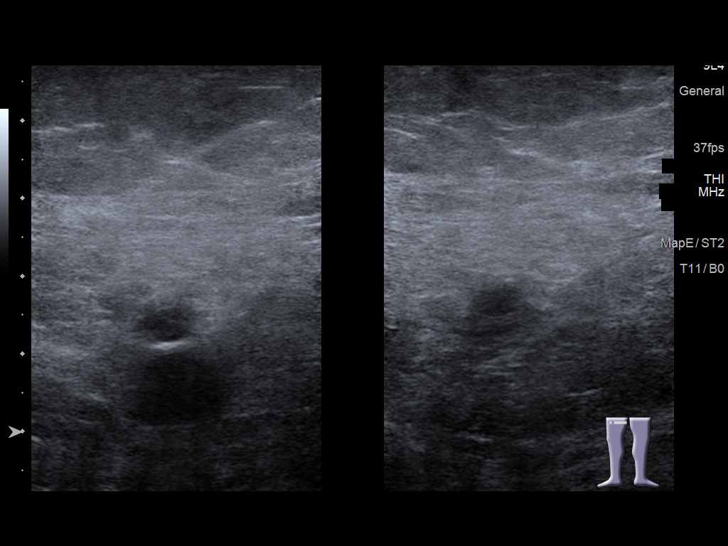
[im 10/58]
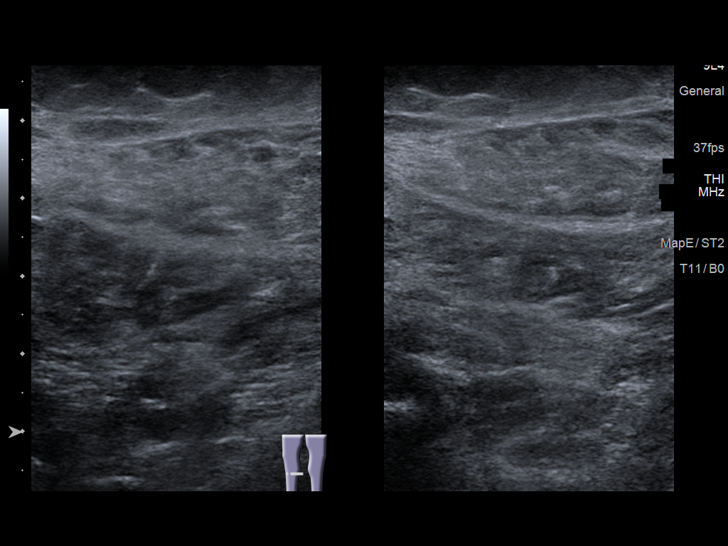
[im 15/58]
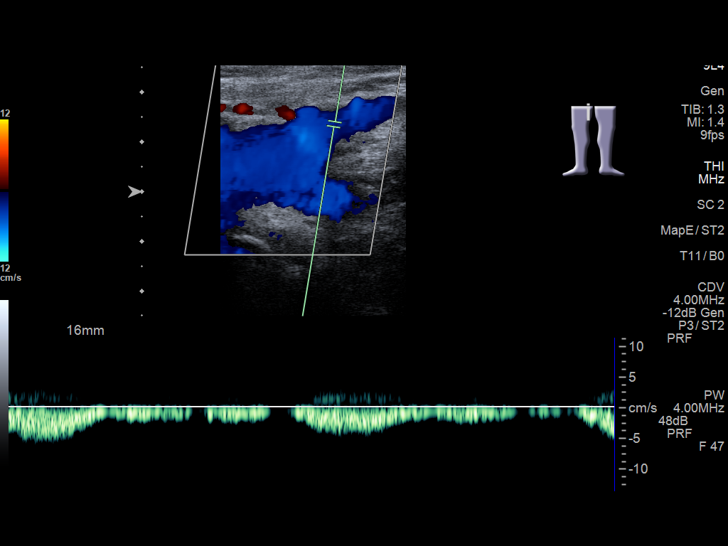
[im 20/58]
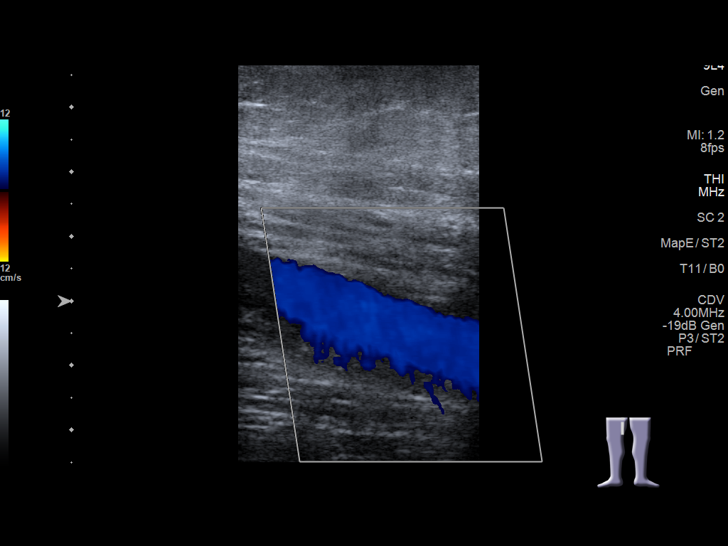
[im 25/58]
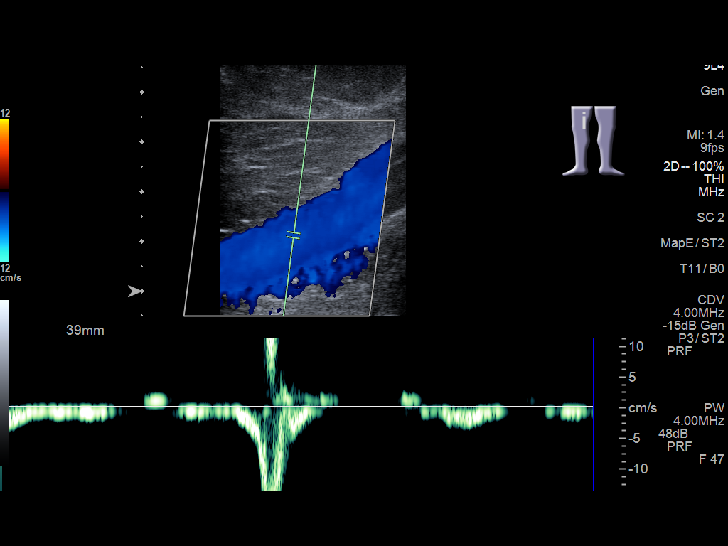
[im 30/58]
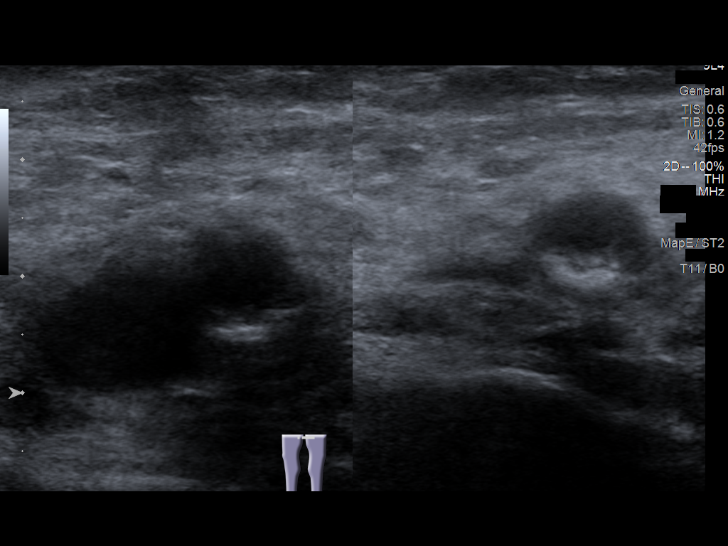
[im 33/58]
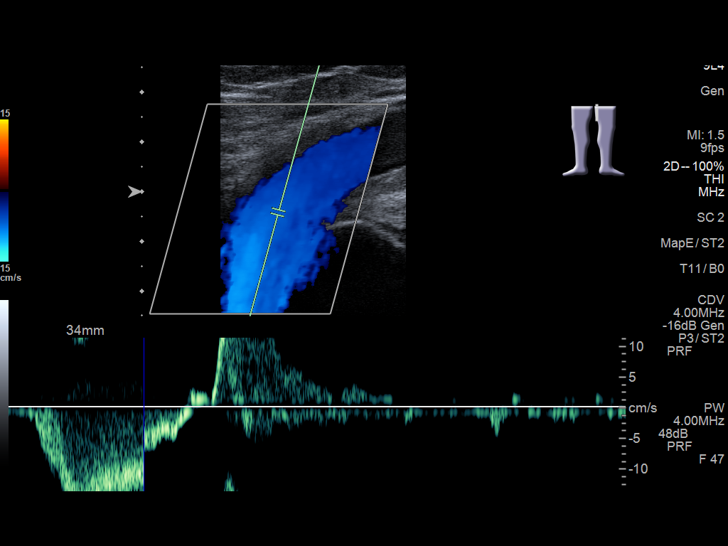
[im 38/58]
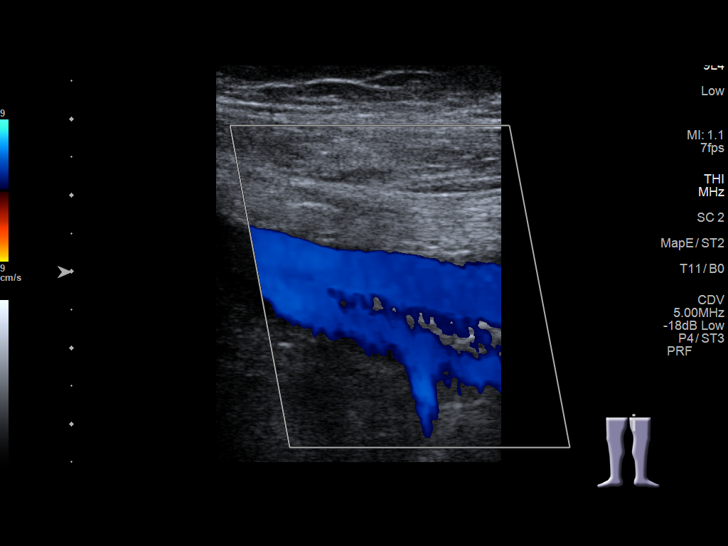
[im 43/58]
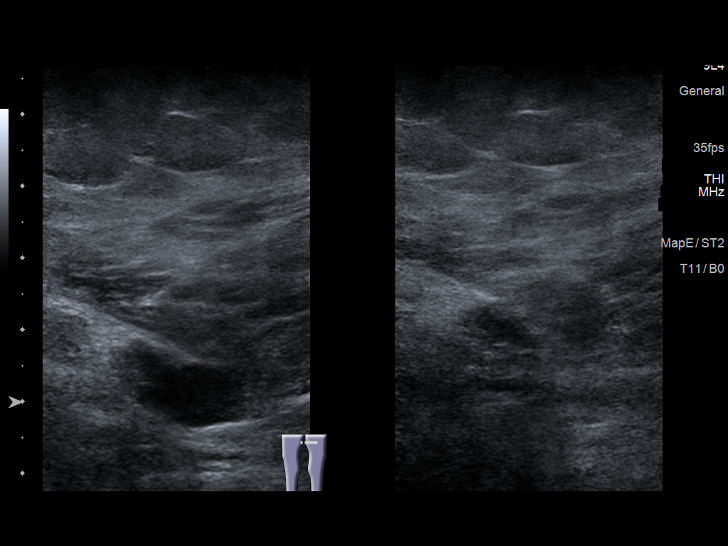
[im 48/58]
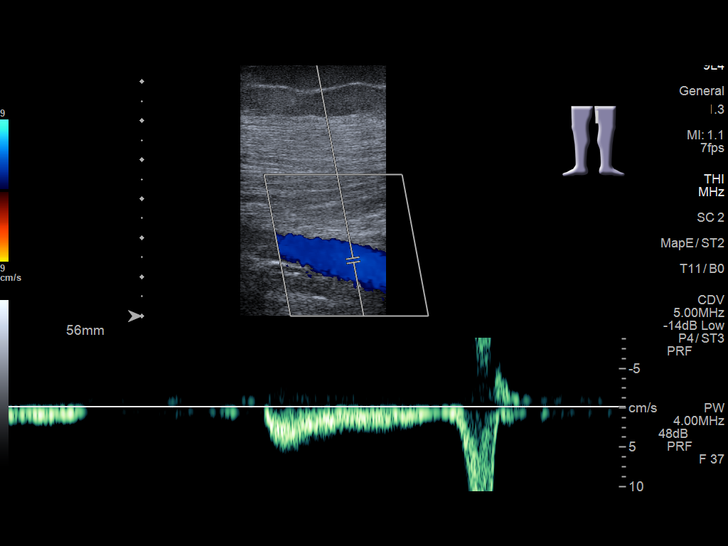
[im 53/58]
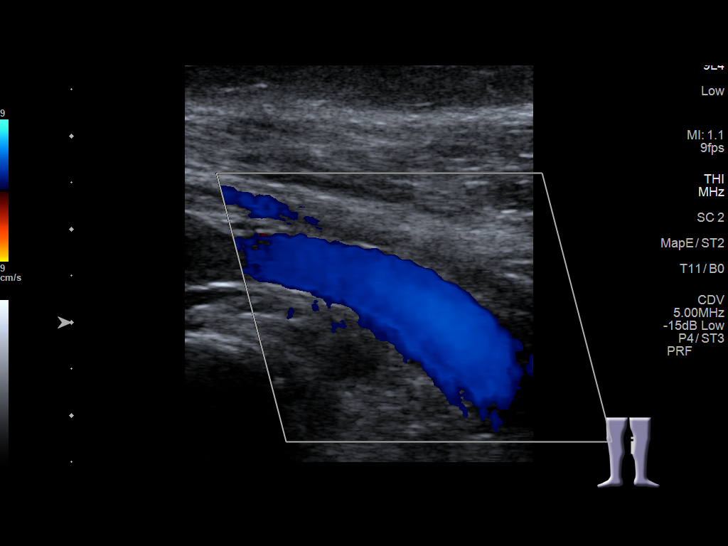
[im 58/58]
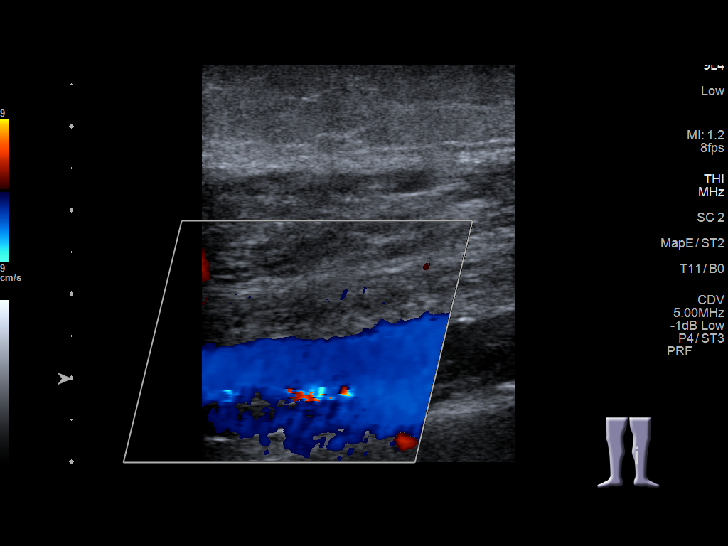

[13 of 24 positions shown; findings below may reference images not displayed]

FINDINGS: RIGHT LOWER EXTREMITY

Common Femoral Vein: No evidence of thrombus. Normal
compressibility, respiratory phasicity and response to augmentation.

Saphenofemoral Junction: No evidence of thrombus. Normal
compressibility and flow on color Doppler imaging.

Profunda Femoral Vein: No evidence of thrombus. Normal
compressibility and flow on color Doppler imaging.

Femoral Vein: No evidence of thrombus. Normal compressibility,
respiratory phasicity and response to augmentation.

Popliteal Vein: No evidence of thrombus. Normal compressibility,
respiratory phasicity and response to augmentation.

Calf Veins: No evidence of thrombus. Normal compressibility and flow
on color Doppler imaging.

Superficial Great Saphenous Vein: No evidence of thrombus. Normal
compressibility.

Venous Reflux:  None.

Other Findings:  None.

LEFT LOWER EXTREMITY

Common Femoral Vein: No evidence of thrombus. Normal
compressibility, respiratory phasicity and response to augmentation.

Saphenofemoral Junction: No evidence of thrombus. Normal
compressibility and flow on color Doppler imaging.

Profunda Femoral Vein: No evidence of thrombus. Normal
compressibility and flow on color Doppler imaging.

Femoral Vein: No evidence of thrombus. Normal compressibility,
respiratory phasicity and response to augmentation.

Popliteal Vein: No evidence of thrombus. Normal compressibility,
respiratory phasicity and response to augmentation.

Calf Veins: No evidence of thrombus. Normal compressibility and flow
on color Doppler imaging.

Superficial Great Saphenous Vein: No evidence of thrombus. Normal
compressibility.

Venous Reflux:  None.

Other Findings:  None.
IMPRESSION: No evidence of deep venous thrombosis in either lower extremity.

## 2020-02-26 IMAGING — CT CT ANGIO CHEST
2 of 6 series · 18 of 46 positions shown · IV contrast (APPLIED)
Comparison: CTA chest [DATE].

CLINICAL DATA: 58-year-old female with increased lower extremity
swelling, shortness of breath, flank pain.

EXAM:
CT ANGIOGRAPHY CHEST WITH CONTRAST
TECHNIQUE: Multidetector CT imaging of the chest was performed using the
standard protocol during bolus administration of intravenous
contrast. Multiplanar CT image reconstructions and MIPs were
obtained to evaluate the vascular anatomy.
CONTRAST:  60mL OMNIPAQUE IOHEXOL 350 MG/ML SOLN

[Series 5: thins (person_name) · axial · 0.63mm/px · z∈[-482,-236]mm · 15 of 271 slices shown]
[im 12/271  lung]
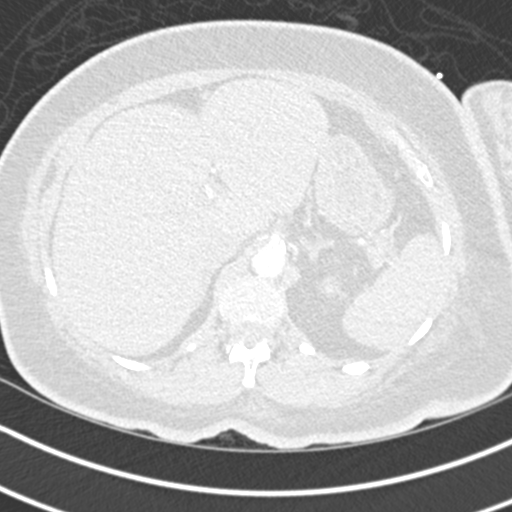
[im 36/271  soft-tissue]
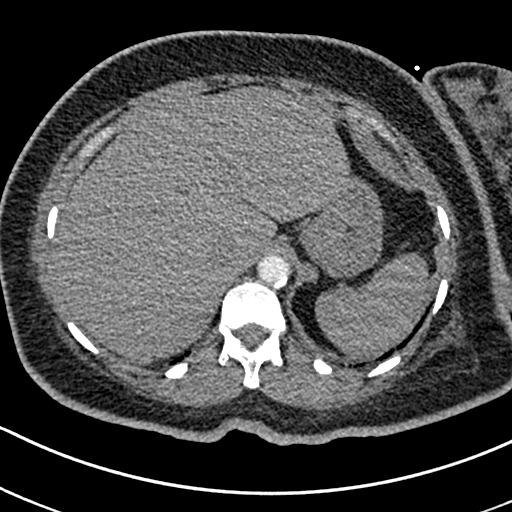
[im 47/271  lung]
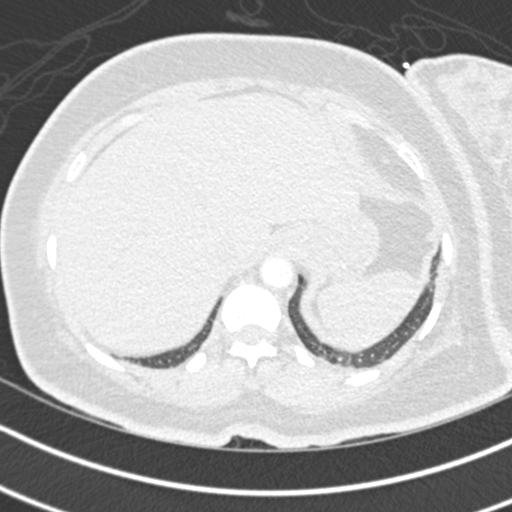
[im 71/271  soft-tissue]
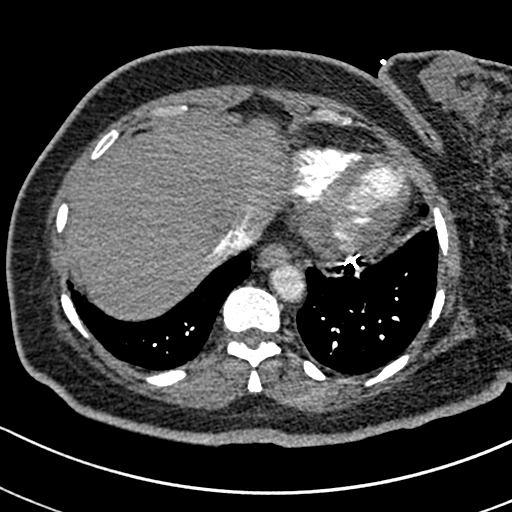
[im 83/271  lung]
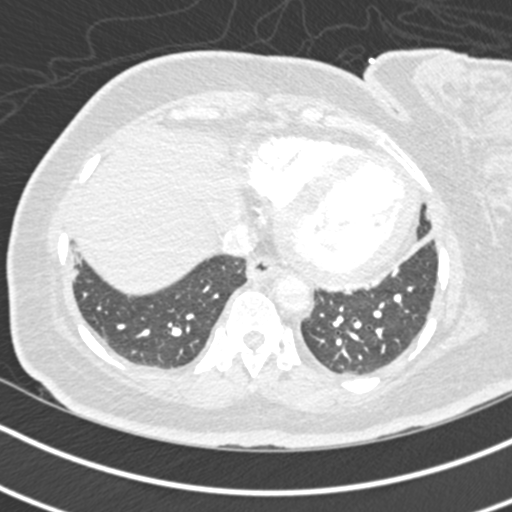
[im 106/271  soft-tissue]
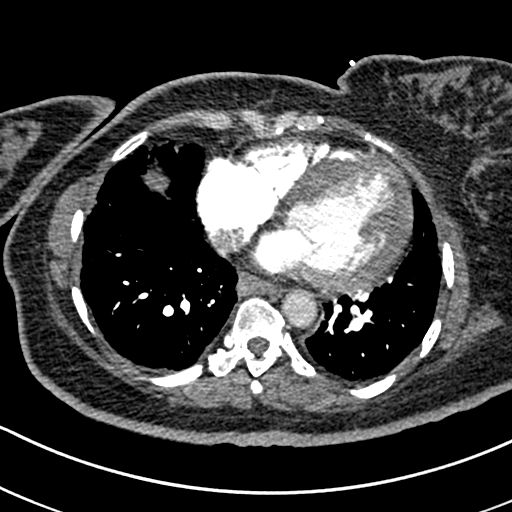
[im 118/271  lung]
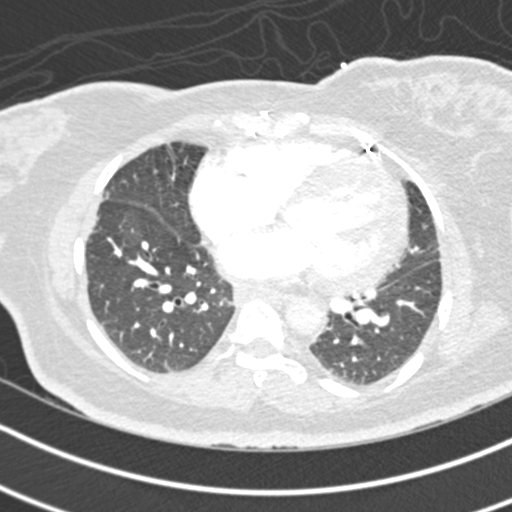
[im 141/271  soft-tissue]
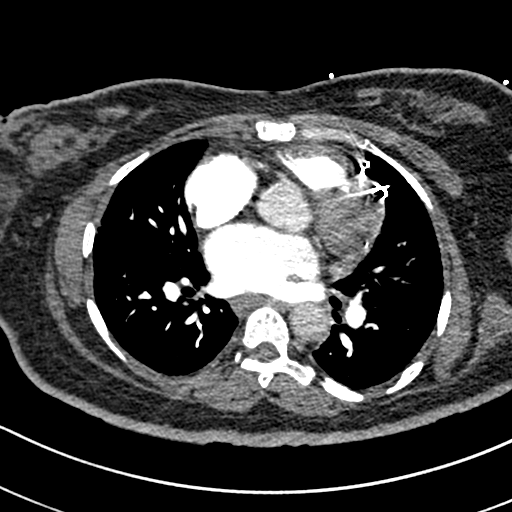
[im 153/271  lung]
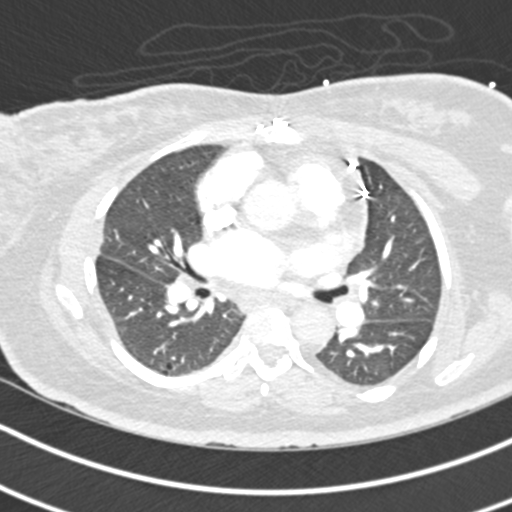
[im 165/271  soft-tissue]
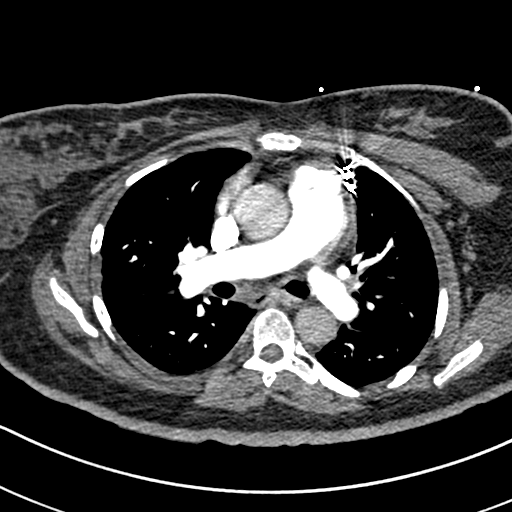
[im 188/271  lung]
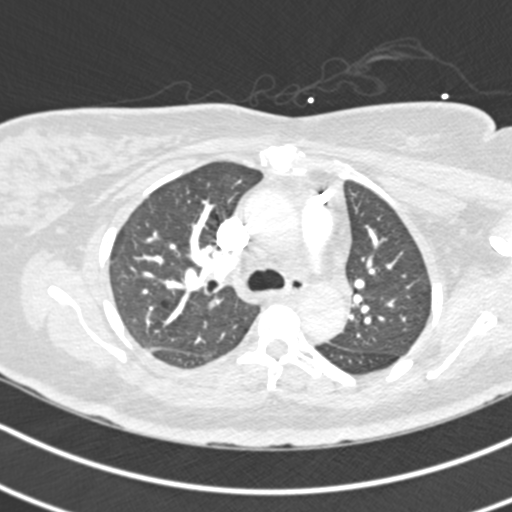
[im 200/271  soft-tissue]
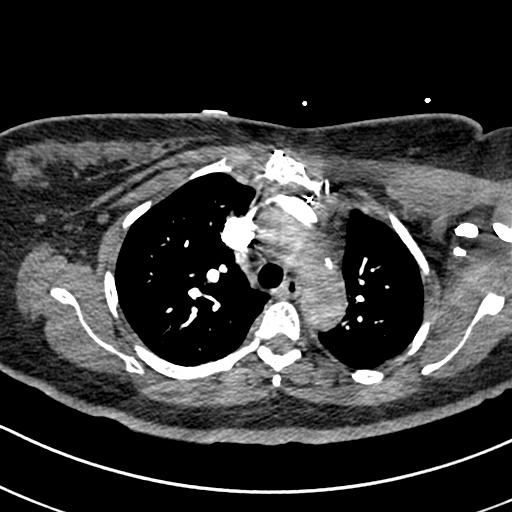
[im 224/271  lung]
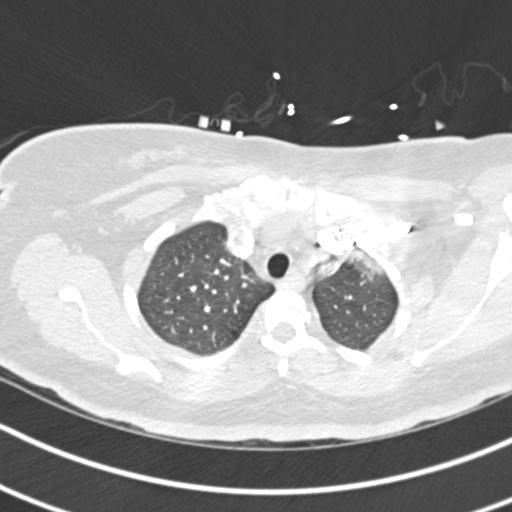
[im 235/271  soft-tissue]
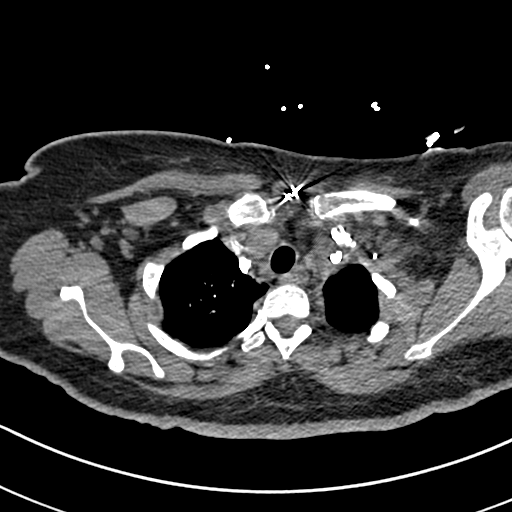
[im 259/271  lung]
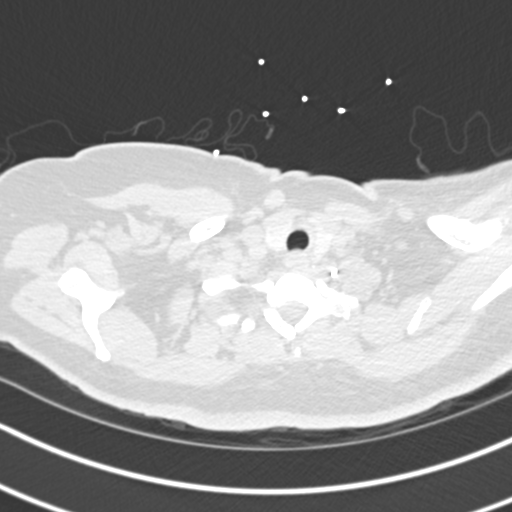

[Series 7: coronal mpr · coronal · 0.55mm/px · 3 of 78 slices shown]
[im 20/78  soft-tissue]
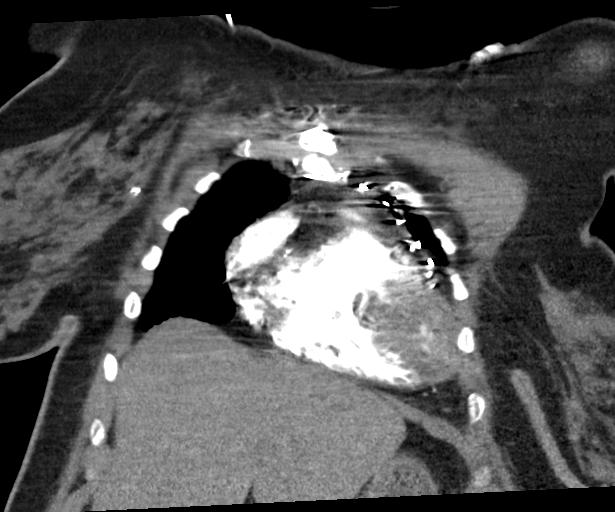
[im 39/78  soft-tissue]
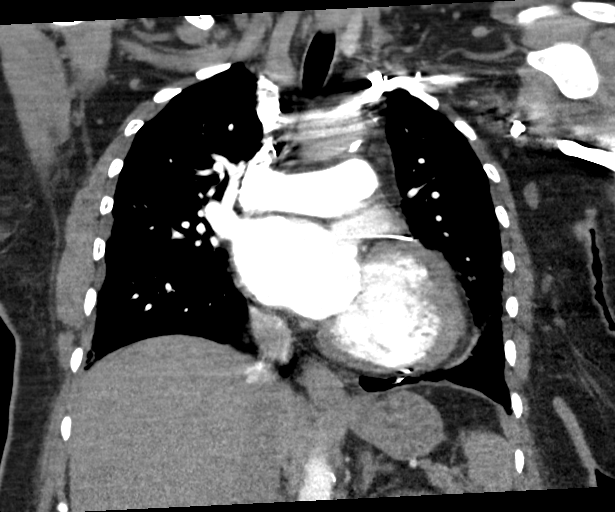
[im 58/78  soft-tissue]
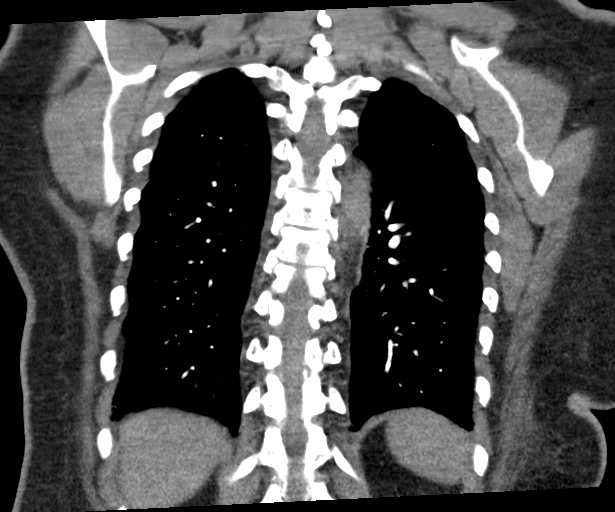

[18 of 46 positions shown; findings below may reference images not displayed]

FINDINGS: Cardiovascular: Good contrast bolus timing in the pulmonary arterial
tree.

No focal filling defect identified in the pulmonary arteries to
suggest acute pulmonary embolism.

Sequelae of CABG. Little contrast in the aorta today. Stable
cardiomegaly. No pericardial effusion.

Mediastinum/Nodes: No mediastinal lymphadenopathy.

Lungs/Pleura: No convincing pleural fluid. Major airways are patent.
Improved lung volumes from last month. No evidence of pulmonary
edema. Mild lung base scarring or atelectasis.

Upper Abdomen: Negative visible liver, spleen, adrenal glands,
pancreas and stomach. Minimal visualization of the left renal upper
pole only.

Musculoskeletal: Prior sternotomy. T1 spina bifida occulta (normal
variant). No acute osseous abnormality identified.

Review of the MIP images confirms the above findings.
IMPRESSION: 1. Negative for acute pulmonary embolus.
2. Cardiomegaly.  Prior CABG.
3. No acute findings in the chest. Improved lung volumes from last
month with mild lung base scarring or atelectasis.

## 2020-02-26 MED ORDER — SODIUM CHLORIDE 0.9% FLUSH: 3.0000 mL | INTRAVENOUS | Status: DC | PRN

## 2020-02-26 MED ORDER — POTASSIUM CHLORIDE CRYS ER 20 MEQ PO TBCR
40.0000 meq | EXTENDED_RELEASE_TABLET | Freq: Once | ORAL | Status: AC
  Administered 2020-02-26: 40 meq via ORAL
  Filled 2020-02-26: qty 2

## 2020-02-26 MED ORDER — IOHEXOL 350 MG/ML SOLN
60.0000 mL | Freq: Once | INTRAVENOUS | Status: AC | PRN
  Administered 2020-02-26: 60 mL via INTRAVENOUS

## 2020-02-26 MED ORDER — AMLODIPINE BESYLATE 5 MG PO TABS
5.0000 mg | ORAL_TABLET | Freq: Once | ORAL | Status: AC
  Administered 2020-02-26: 5 mg via ORAL
  Filled 2020-02-26: qty 1

## 2020-02-26 MED ORDER — SODIUM CHLORIDE 0.9 % IV SOLN: 250.0000 mL | INTRAVENOUS | Status: DC | PRN

## 2020-02-26 MED ORDER — INSULIN GLARGINE 100 UNIT/ML ~~LOC~~ SOLN
50.0000 [IU] | Freq: Every day | SUBCUTANEOUS | Status: DC
  Administered 2020-02-26 – 2020-03-01 (×5): 50 [IU] via SUBCUTANEOUS
  Filled 2020-02-26 (×6): qty 0.5

## 2020-02-26 MED ORDER — TOPIRAMATE 25 MG PO TABS
25.0000 mg | ORAL_TABLET | Freq: Every day | ORAL | Status: DC
  Administered 2020-02-27 – 2020-03-02 (×5): 25 mg via ORAL
  Filled 2020-02-26 (×5): qty 1

## 2020-02-26 MED ORDER — LABETALOL HCL 5 MG/ML IV SOLN
5.0000 mg | Freq: Once | INTRAVENOUS | Status: AC
  Administered 2020-02-26: 5 mg via INTRAVENOUS
  Filled 2020-02-26: qty 4

## 2020-02-26 MED ORDER — INSULIN ASPART 100 UNIT/ML ~~LOC~~ SOLN
0.0000 [IU] | SUBCUTANEOUS | Status: DC
  Administered 2020-02-26 – 2020-02-27 (×4): 2 [IU] via SUBCUTANEOUS
  Administered 2020-02-27: 3 [IU] via SUBCUTANEOUS
  Administered 2020-02-27: 1 [IU] via SUBCUTANEOUS
  Administered 2020-02-28: 2 [IU] via SUBCUTANEOUS
  Administered 2020-02-28: 1 [IU] via SUBCUTANEOUS
  Administered 2020-02-28 (×2): 2 [IU] via SUBCUTANEOUS
  Administered 2020-02-28: 5 [IU] via SUBCUTANEOUS
  Administered 2020-02-29: 3 [IU] via SUBCUTANEOUS
  Filled 2020-02-26 (×13): qty 1

## 2020-02-26 MED ORDER — HYDROCODONE-ACETAMINOPHEN 5-325 MG PO TABS
1.0000 | ORAL_TABLET | Freq: Once | ORAL | Status: AC
  Administered 2020-02-26: 1 via ORAL
  Filled 2020-02-26: qty 1

## 2020-02-26 MED ORDER — METOPROLOL TARTRATE 25 MG PO TABS
25.0000 mg | ORAL_TABLET | Freq: Two times a day (BID) | ORAL | Status: DC
  Administered 2020-02-27 – 2020-02-28 (×4): 25 mg via ORAL
  Filled 2020-02-26 (×4): qty 1

## 2020-02-26 MED ORDER — SODIUM CHLORIDE 0.9% FLUSH: 3.0000 mL | Freq: Once | INTRAVENOUS | Status: DC

## 2020-02-26 MED ORDER — TOPIRAMATE 25 MG PO TABS: 25.0000 mg | ORAL_TABLET | Freq: Every day | ORAL | Status: DC

## 2020-02-26 MED ORDER — ONDANSETRON HCL 4 MG PO TABS: 4.0000 mg | ORAL_TABLET | Freq: Four times a day (QID) | ORAL | Status: DC | PRN

## 2020-02-26 MED ORDER — LABETALOL HCL 5 MG/ML IV SOLN
10.0000 mg | Freq: Once | INTRAVENOUS | Status: AC
  Administered 2020-02-26: 10 mg via INTRAVENOUS
  Filled 2020-02-26: qty 4

## 2020-02-26 MED ORDER — FUROSEMIDE 10 MG/ML IJ SOLN
60.0000 mg | Freq: Once | INTRAMUSCULAR | Status: AC
  Administered 2020-02-26: 60 mg via INTRAVENOUS
  Filled 2020-02-26: qty 8

## 2020-02-26 MED ORDER — ACETAMINOPHEN 325 MG PO TABS
650.0000 mg | ORAL_TABLET | Freq: Four times a day (QID) | ORAL | Status: DC | PRN
  Administered 2020-02-28: 650 mg via ORAL
  Filled 2020-02-26: qty 2

## 2020-02-26 MED ORDER — MIRTAZAPINE 15 MG PO TABS
30.0000 mg | ORAL_TABLET | Freq: Every day | ORAL | Status: DC
  Administered 2020-02-26 – 2020-03-01 (×5): 30 mg via ORAL
  Filled 2020-02-26 (×5): qty 2

## 2020-02-26 MED ORDER — SODIUM CHLORIDE 0.9% FLUSH
3.0000 mL | Freq: Two times a day (BID) | INTRAVENOUS | Status: DC
  Administered 2020-02-27 – 2020-03-02 (×10): 3 mL via INTRAVENOUS

## 2020-02-26 MED ORDER — ISOSORBIDE MONONITRATE ER 30 MG PO TB24
30.0000 mg | ORAL_TABLET | Freq: Every day | ORAL | Status: DC
  Administered 2020-02-26 – 2020-03-02 (×6): 30 mg via ORAL
  Filled 2020-02-26 (×6): qty 1

## 2020-02-26 MED ORDER — ONDANSETRON HCL 4 MG/2ML IJ SOLN
4.0000 mg | Freq: Four times a day (QID) | INTRAMUSCULAR | Status: DC | PRN
  Administered 2020-02-28: 4 mg via INTRAVENOUS
  Filled 2020-02-26: qty 2

## 2020-02-26 MED ORDER — FUROSEMIDE 10 MG/ML IJ SOLN
40.0000 mg | Freq: Two times a day (BID) | INTRAMUSCULAR | Status: DC
  Administered 2020-02-27 – 2020-02-28 (×3): 40 mg via INTRAVENOUS
  Filled 2020-02-26 (×3): qty 4

## 2020-02-26 MED ORDER — HYDRALAZINE HCL 50 MG PO TABS
75.0000 mg | ORAL_TABLET | Freq: Once | ORAL | Status: AC
  Administered 2020-02-26: 75 mg via ORAL
  Filled 2020-02-26: qty 2

## 2020-02-26 MED ORDER — METHOCARBAMOL 500 MG PO TABS
500.0000 mg | ORAL_TABLET | Freq: Three times a day (TID) | ORAL | Status: DC | PRN
  Filled 2020-02-26 (×2): qty 1

## 2020-02-26 MED ORDER — ENOXAPARIN SODIUM 40 MG/0.4ML ~~LOC~~ SOLN
30.0000 mg | SUBCUTANEOUS | Status: DC
  Administered 2020-02-26: 30 mg via SUBCUTANEOUS
  Filled 2020-02-26: qty 0.4

## 2020-02-26 MED ORDER — ATORVASTATIN CALCIUM 20 MG PO TABS
80.0000 mg | ORAL_TABLET | Freq: Every day | ORAL | Status: DC
  Administered 2020-02-26: 80 mg via ORAL
  Filled 2020-02-26: qty 4

## 2020-02-26 MED ORDER — ASPIRIN EC 81 MG PO TBEC
81.0000 mg | DELAYED_RELEASE_TABLET | Freq: Every day | ORAL | Status: DC
  Administered 2020-02-27 – 2020-03-02 (×5): 81 mg via ORAL
  Filled 2020-02-26 (×5): qty 1

## 2020-02-26 MED ORDER — VALACYCLOVIR HCL 500 MG PO TABS
500.0000 mg | ORAL_TABLET | Freq: Two times a day (BID) | ORAL | Status: DC
  Administered 2020-02-26 – 2020-03-02 (×10): 500 mg via ORAL
  Filled 2020-02-26 (×11): qty 1

## 2020-02-26 MED ORDER — METOPROLOL TARTRATE 25 MG PO TABS
25.0000 mg | ORAL_TABLET | Freq: Once | ORAL | Status: AC
  Administered 2020-02-26: 25 mg via ORAL
  Filled 2020-02-26: qty 1

## 2020-02-26 MED ORDER — OXYCODONE HCL 5 MG PO TABS
5.0000 mg | ORAL_TABLET | ORAL | Status: DC | PRN
  Administered 2020-02-26 – 2020-03-01 (×5): 5 mg via ORAL
  Filled 2020-02-26 (×5): qty 1

## 2020-02-26 MED ORDER — MOMETASONE FURO-FORMOTEROL FUM 200-5 MCG/ACT IN AERO
2.0000 | INHALATION_SPRAY | Freq: Two times a day (BID) | RESPIRATORY_TRACT | Status: DC
  Administered 2020-02-27 – 2020-03-02 (×10): 2 via RESPIRATORY_TRACT
  Filled 2020-02-26: qty 8.8

## 2020-02-26 MED ORDER — MAGNESIUM OXIDE 400 (241.3 MG) MG PO TABS
400.0000 mg | ORAL_TABLET | Freq: Two times a day (BID) | ORAL | Status: DC
  Administered 2020-02-26 – 2020-03-02 (×10): 400 mg via ORAL
  Filled 2020-02-26 (×10): qty 1

## 2020-02-26 MED ORDER — ACETAMINOPHEN 650 MG RE SUPP: 650.0000 mg | Freq: Four times a day (QID) | RECTAL | Status: DC | PRN

## 2020-02-26 NOTE — ED Notes (Signed)
Ultrasound at bedside

## 2020-02-26 NOTE — ED Notes (Signed)
US at bedside

## 2020-02-26 NOTE — ED Notes (Signed)
Patient transported to CT 

## 2020-02-26 NOTE — ED Notes (Signed)
Patient assisted to restroom, no difficulties. Patient's belongings placed in bags, collected clothes, black shoes, 2 purses

## 2020-02-26 NOTE — ED Notes (Signed)
Pt father updated with pt permission.

## 2020-02-26 NOTE — ED Notes (Signed)
Sent blue top to lab

## 2020-02-26 NOTE — ED Notes (Signed)
Patient provided with meal tray.

## 2020-02-26 NOTE — ED Provider Notes (Signed)
[facility] Emergency Department Provider Note    First MD Initiated Contact with Patient 02/26/20 1413     (approximate)  I have reviewed the triage vital signs and the nursing notes.   HISTORY  Chief Complaint Dizziness    HPI Terry Arroyo is a 59 y.o. female below listed past medical history as well as a history of congestive heart failure with EF less than 30% presents to the ER for worsening swelling in her extremities as well as exertional shortness of breath.  She not having active chest pain right now but has had some discomfort over the past few days.  States she been taking her medications as prescribed.  States that they did recently change her torsemide but is not having any effect and feels like she started swelling over the past few days.  Denies any nausea or vomiting.    Past Medical History:  Diagnosis Date  . Blind left eye   . Brain tumor ([place]) 1986  . Diabetes mellitus without complication ([place])   . Hyperlipidemia   . Hypertension   . MI (myocardial infarction) ([place]) 2018   No family history on file. Past Surgical History:  Procedure Laterality Date  . BRAIN SURGERY    . CESAREAN SECTION     Patient Active Problem List   Diagnosis Date Noted  . Hyperglycemia   . Nausea   . Hypertensive emergency   . Hypertensive urgency 11/24/2019  . Nonspecific chest pain 11/23/2019      Prior to Admission medications   Medication Sig Start Date End Date Taking? Authorizing Provider  amLODipine (NORVASC) 5 MG tablet Take 1 tablet (5 mg total) by mouth 2 (two) times daily. 11/26/19   Lavina Hamman, MD  aspirin EC 81 MG EC tablet Take 1 tablet (81 mg total) by mouth daily. 11/26/19   Lavina Hamman, MD  atorvastatin (LIPITOR) 80 MG tablet Take 1 tablet (80 mg total) by mouth daily at 6 PM. 11/26/19   Lavina Hamman, MD  Fluticasone-Salmeterol (ADVAIR) 250-50 MCG/DOSE AEPB Inhale 1 puff into the lungs 2 (two) times daily.    [provider]  hydrALAZINE (APRESOLINE) 50 MG tablet Take 1 tablet (50 mg total) by mouth 3 (three) times daily. 11/26/19   Lavina Hamman, MD  insulin aspart protamine- aspart (NOVOLOG MIX 70/30) (70-30) 100 UNIT/ML injection Inject 0.4 mLs (40 Units total) into the skin 2 (two) times daily with a meal. 11/26/19   Lavina Hamman, MD  isosorbide mononitrate (IMDUR) 60 MG 24 hr tablet Take 1 tablet (60 mg total) by mouth daily. 01/24/20   Max Sane, MD  methocarbamol (ROBAXIN) 500 MG tablet Take 1 tablet (500 mg total) by mouth every 8 (eight) hours as needed for muscle spasms. 11/26/19   Lavina Hamman, MD  metoprolol tartrate (LOPRESSOR) 25 MG tablet Take 1 tablet (25 mg total) by mouth 2 (two) times daily. 11/26/19   Lavina Hamman, MD  mirtazapine (REMERON) 30 MG tablet Take 1 tablet (30 mg total) by mouth at bedtime. 11/26/19   Lavina Hamman, MD  topiramate (TOPAMAX) 25 MG capsule Take 25 mg by mouth daily.    [provider]  torsemide (DEMADEX) 10 MG tablet Take 10 mg by mouth daily.    [provider]  valACYclovir (VALTREX) 500 MG tablet Take 1 tablet by mouth 2 (two) times daily. 05/20/17   [provider]    Allergies Shellfish allergy, Severiano Gilbert  extract, and Metformin    Social History Social History   Tobacco Use  . Smoking status: Former Research scientist (life sciences)  . Smokeless tobacco: Never Used  Substance Use Topics  . Alcohol use: Not Currently  . Drug use: Not Currently    Review of Systems Patient denies headaches, rhinorrhea, blurry vision, numbness, shortness of breath, chest pain, edema, cough, abdominal pain, nausea, vomiting, diarrhea, dysuria, fevers, rashes or hallucinations unless otherwise stated above in HPI. ____________________________________________   PHYSICAL EXAM:  VITAL SIGNS: Vitals:   02/26/20 1748 02/26/20 1800  BP: (!) 189/98 (!) 185/99  Pulse: 72 68  Resp: 13 17  Temp:    SpO2: 97% 98%    Constitutional: Alert and oriented.    Eyes: Conjunctivae are normal.  Head: Atraumatic. Nose: No congestion/rhinnorhea. Mouth/Throat: Mucous membranes are moist.   Neck: No stridor. Painless ROM.  Cardiovascular: Normal rate, regular rhythm. Grossly normal heart sounds.  Good peripheral circulation. Respiratory: Normal respiratory effort.  No retractions. Lungs with bibasilar inspiratory crackles Gastrointestinal: Soft and nontender. No distention. No abdominal bruits. No CVA tenderness. Genitourinary:  Musculoskeletal: No lower extremity tenderness 2+ pitting edema. BLE and BUE.Marland Kitchen  No joint effusions. Neurologic:  Normal speech and language. No gross focal neurologic deficits are appreciated. No facial droop Skin:  Skin is warm, dry and intact. No rash noted. Psychiatric: Mood and affect are normal. Speech and behavior are normal.  ____________________________________________   LABS (all labs ordered are listed, but only abnormal results are displayed)  Results for orders placed or performed during the hospital encounter of 02/26/20 (from the past 24 hour(s))  Basic metabolic panel     Status: Abnormal   Collection Time: 02/26/20  1:02 PM  Result Value Ref Range   Sodium 140 135 - 145 mmol/L   Potassium 2.8 (L) 3.5 - 5.1 mmol/L   Chloride 105 98 - 111 mmol/L   CO2 22 22 - 32 mmol/L   Glucose, Bld 257 (H) 70 - 99 mg/dL   BUN 14 6 - 20 mg/dL   Creatinine, Ser 1.74 (H) 0.44 - 1.00 mg/dL   Calcium 8.5 (L) 8.9 - 10.3 mg/dL   GFR calc non Af Amer 32 (L) >60 mL/min   GFR calc Af Amer 37 (L) >60 mL/min   Anion gap 13 5 - 15  CBC     Status: None   Collection Time: 02/26/20  1:02 PM  Result Value Ref Range   WBC 9.4 4.0 - 10.5 K/uL   RBC 4.27 3.87 - 5.11 MIL/uL   Hemoglobin 12.1 12.0 - 15.0 g/dL   HCT 36.7 36.0 - 46.0 %   MCV 85.9 80.0 - 100.0 fL   MCH 28.3 26.0 - 34.0 pg   MCHC 33.0 30.0 - 36.0 g/dL   RDW 14.6 11.5 - 15.5 %   Platelets 369 150 - 400 K/uL   nRBC 0.0 0.0 - 0.2 %  Troponin I (High Sensitivity)      Status: Abnormal   Collection Time: 02/26/20  1:02 PM  Result Value Ref Range   Troponin I (High Sensitivity) 46 (H) <18 ng/L  Hepatic function panel     Status: Abnormal   Collection Time: 02/26/20  1:02 PM  Result Value Ref Range   Total Protein 6.5 6.5 - 8.1 g/dL   Albumin 2.4 (L) 3.5 - 5.0 g/dL   AST 16 15 - 41 U/L   ALT 11 0 - 44 U/L   Alkaline Phosphatase 64 38 - 126 U/L  Total Bilirubin 0.5 0.3 - 1.2 mg/dL   Bilirubin, Direct <0.1 0.0 - 0.2 mg/dL   Indirect Bilirubin NOT CALCULATED 0.3 - 0.9 mg/dL  Brain natriuretic peptide     Status: Abnormal   Collection Time: 02/26/20  1:02 PM  Result Value Ref Range   B Natriuretic Peptide 610.0 (H) 0.0 - 100.0 pg/mL  Troponin I (High Sensitivity)     Status: Abnormal   Collection Time: 02/26/20  4:09 PM  Result Value Ref Range   Troponin I (High Sensitivity) 44 (H) <18 ng/L  Urinalysis, Complete w Microscopic     Status: Abnormal   Collection Time: 02/26/20  4:20 PM  Result Value Ref Range   Color, Urine STRAW (A) YELLOW   APPearance CLEAR (A) CLEAR   Specific Gravity, Urine 1.004 (L) 1.005 - 1.030   pH 7.0 5.0 - 8.0   Glucose, UA 150 (A) NEGATIVE mg/dL   Hgb urine dipstick NEGATIVE NEGATIVE   Bilirubin Urine NEGATIVE NEGATIVE   Ketones, ur NEGATIVE NEGATIVE mg/dL   Protein, ur >=300 (A) NEGATIVE mg/dL   Nitrite NEGATIVE NEGATIVE   Leukocytes,Ua NEGATIVE NEGATIVE   RBC / HPF 0-5 0 - 5 RBC/hpf   WBC, UA 0-5 0 - 5 WBC/hpf   Bacteria, UA NONE SEEN NONE SEEN   Squamous Epithelial / LPF 0-5 0 - 5   Hyaline Casts, UA PRESENT    ____________________________________________  EKG My review and personal interpretation at Time: 13:05   Indication: sob  Rate: 70  Rhythm: sinus Axis: left Other: lvh, nonspecific st abn ____________________________________________  RADIOLOGY  I personally reviewed all radiographic images ordered to evaluate for the above acute complaints and reviewed radiology reports and findings.  These findings  were personally discussed with the patient.  Please see medical record for radiology report.  ____________________________________________   PROCEDURES  Procedure(s) performed:  .Critical Care Performed by: Merlyn Lot, MD Authorized by: Merlyn Lot, MD   Critical care provider statement:    Critical care time (minutes):  30   Critical care time was exclusive of:  Separately billable procedures and treating other patients   Critical care was necessary to treat or prevent imminent or life-threatening deterioration of the following conditions:  Cardiac failure   Critical care was time spent personally by me on the following activities:  Development of treatment plan with patient or surrogate, discussions with consultants, evaluation of patient's response to treatment, examination of patient, obtaining history from patient or surrogate, ordering and performing treatments and interventions, ordering and review of laboratory studies, ordering and review of radiographic studies, pulse oximetry, re-evaluation of patient's condition and review of old charts      Critical Care performed: yes ____________________________________________   INITIAL IMPRESSION / [place] / ED COURSE  Pertinent labs & imaging results that were available during my care of the patient were reviewed by me and considered in my medical decision making (see chart for details).   DDX: Asthma, copd, CHF, pna, ptx, malignancy, Pe, anemia   Terry Arroyo is a 59 y.o. who presents to the ED with swelling exertional shortness of breath as described above.  Patient protecting her airway she not actually hypoxic but is markedly hypertensive.  She not complaining of chest pain but does have diffuse edema both bilateral lower extremity and upper extremity also feel she is having swelling around her eyes now.  Clinical Course as of Feb 26 1812  Tue Feb 26, 2020  1630 Blood pressure is improving.  No  evidence of exertional dyspnea or hypoxia.  Patient now stating she is having some flank pain where she might have a urinary tract infection.  Will check urine.   [PR]    Clinical Course User Index [PR] Merlyn Lot, MD    The patient was evaluated in Emergency Department today for the symptoms described in the history of present illness. He/she was evaluated in the context of the global COVID-19 pandemic, which necessitated consideration that the patient might be at risk for infection with the SARS-CoV-2 virus that causes COVID-19. Institutional protocols and algorithms that pertain to the evaluation of patients at risk for COVID-19 are in a state of rapid change based on information released by regulatory bodies including the CDC and federal and state organizations. These policies and algorithms were followed during the patient's care in the ED.  As part of my medical decision making, I reviewed the following data within the [place] notes reviewed and incorporated, Labs reviewed, notes from prior ED visits and [place] Controlled Substance Database   ____________________________________________   FINAL CLINICAL IMPRESSION(S) / ED DIAGNOSES  Final diagnoses:  Hypertensive emergency      NEW MEDICATIONS STARTED DURING THIS VISIT:  New Prescriptions   No medications on file     Note:  This document was prepared using Dragon voice recognition software and may include unintentional dictation errors.    Merlyn Lot, MD 02/26/20 [phone]

## 2020-02-26 NOTE — ED Triage Notes (Signed)
FIRST NURSE NOTE- pt c/o feeling dizzy since yesterday. Pt feels like her hands and legs are swollen. NAD at this time. Placed in wheelchair.

## 2020-02-26 NOTE — H&P (Addendum)
Terry Arroyo IWL:[id number] DOB: Apr 13, 1961 DOA: 02/26/2020     PCP: Kirk Ruths, MD   Outpatient Specialists:   CARDS:   Bethann Berkshire, MD   Endocrinology Dr. Pasty Arch Patient arrived to ER on 02/26/20 at 1220  Patient coming from: home Lives  With family    Chief Complaint:   Chief Complaint  Patient presents with  . Dizziness    HPI: Terry Arroyo is a 59 y.o. female with medical history significant of DM2, COPD, CAD, depression, OSA, HTN, sciatica, Hx of Stroke, HLD, brain tumor status post resection and left eye blindness     Presented with generalized edema and feeling somewhat lightheaded.  Edema started her lower extremities but progressed to her upper extremities and even her face feels puffy.she has had some associated left-sided chest pain described as a warm feeling in her chest she also have had some associated nausea vomiting some diarrhea.  She feels  shortness of breath worsening when she ambulates. Reportedly has been compliant with her home medications Supposedly her torsemide was increased recently but she does not feel it made any difference. Last admition was in Feb 2021 for hypertensive urgency and chest pain  Myoview was slightly abnormal but fixed defect per cardio, no significant evidence of ischemia, EF severely reduced at <30% She initially required nitroglycerin drip and was able to control her blood pressures Hydralazine was added to her home medications She was also noted to have UTI and treated with Rocephin.  Reports she could not go to the cardiology appointment bc she was told that the doctor does onto work there anymore.  States she had an allergic reaction where her face swell up 2 wks ago and was told it was due to hydralazine and it was stopped Infectious risk factors:  Reports shortness of breath, dry cough, chest pain,  N/V/Diarrhea     in house  PCR testing  Pending  Lab Results  Component Value Date   [place] 01/22/2020   [place] NEGATIVE 11/23/2019     Regarding pertinent Chronic problems:    Hyperlipidemia -  on statins states she cannot tolerate Lipitor   HTN on Norvasc, imdur. Lsinopril, metoprolol   chronic CHF diastolic/systolic/ combined -  Torsemide 10mg  a day and lasix 20 mg PRN    CAD  - On Aspirin, statin, betablocker,                  -  followed by cardiology had MI while in [place] sp CABG                   DM 2 -  Lab Results  Component Value Date   HGBA1C 14.1 (H) 01/22/2020   on insulin,        obesity-   BMI Readings from Last 1 Encounters:  02/26/20 30.18 kg/m    COPD - not on oxygen PF Readings from Last 1 Encounters:  No data found for PF      OSA -not using  CPAP     Hx of CVA - 2014  With  residual deficits left side weakness on Aspirin 81 mg,     CKD stage III  - baseline Cr 1.7 Lab Results  Component Value Date   CREATININE 1.74 (H) 02/26/2020   CREATININE 1.72 (H) 01/23/2020   CREATININE 1.37 (H) 01/22/2020     While in ER: Initially in the emergency department hypertensive into the [place] medications was  given as well as doses of labetalol IV with some improvement. Patient at some point did report some flank pain but felt similar to when she had a urinary tract infection. Troponin noted to be mildly elevated at 40 Albumin down to 2.4 BNP elevated at 610 Patient not hypoxic evidence of some vascular congestion on chest x-ray Potassium noted to be down to 2.8 Given hypokalemia history of CKD and evidence of significant fluid overload  Hospitalist was called for admission for hypertensive urgency hypokalemia and acute on chronic systolic CHF exacerbation The following Work up has been ordered so far:  Orders Placed This Encounter  Procedures  . Critical Care  . SARS CORONAVIRUS 2 (TAT 6-24 HRS) Nasopharyngeal Nasopharyngeal Swab  . DG Chest 2 View  . Basic metabolic panel  . CBC  . Urinalysis, Complete w  Microscopic  . Hepatic function panel  . Brain natriuretic peptide  . Protime-INR  . Urine Drug Screen, Qualitative ([place] only)  . Cardiac monitoring  . [place] Lock IV, Maintain IV access  . Consult to hospitalist  ALL PATIENTS BEING ADMITTED/HAVING PROCEDURES NEED COVID-19 SCREENING  . Pulse oximetry, continuous  . ED EKG     Following Medications were ordered in ER: Medications  sodium chloride flush (NS) 0.9 % injection 3 mL (3 mLs Intravenous Refused 02/26/20 1521)  furosemide (LASIX) injection 60 mg (60 mg Intravenous Given 02/26/20 1544)  hydrALAZINE (APRESOLINE) tablet 75 mg (75 mg Oral Given 02/26/20 1455)  potassium chloride SA (KLOR-CON) CR tablet 40 mEq (40 mEq Oral Given 02/26/20 1635)  HYDROcodone-acetaminophen (NORCO/VICODIN) 5-325 MG per tablet 1 tablet (1 tablet Oral Given 02/26/20 1649)  amLODipine (NORVASC) tablet 5 mg (5 mg Oral Given 02/26/20 1659)  metoprolol tartrate (LOPRESSOR) tablet 25 mg (25 mg Oral Given 02/26/20 1718)  labetalol (NORMODYNE) injection 5 mg (5 mg Intravenous Given 02/26/20 1733)  labetalol (NORMODYNE) injection 10 mg (10 mg Intravenous Given 02/26/20 1805)        Consult Orders  (From admission, onward)         Start     Ordered   02/26/20 1758  Consult to hospitalist  ALL PATIENTS BEING ADMITTED/HAVING PROCEDURES NEED COVID-19 SCREENING  Once    Comments: ALL PATIENTS BEING ADMITTED/HAVING PROCEDURES NEED COVID-19 SCREENING  Provider:  (Not yet assigned)  Question Answer Comment  Place call to: [phone]   Reason for Consult Admit      02/26/20 1757          Significant initial  Findings: Abnormal Labs Reviewed  BASIC METABOLIC PANEL - Abnormal; Notable for the following components:      Result Value   Potassium 2.8 (*)    Glucose, Bld 257 (*)    Creatinine, Ser 1.74 (*)    Calcium 8.5 (*)    GFR calc non Af Amer 32 (*)    GFR calc Af Amer 37 (*)    All other components within normal limits  URINALYSIS, COMPLETE (UACMP) WITH MICROSCOPIC  - Abnormal; Notable for the following components:   Color, Urine STRAW (*)    APPearance CLEAR (*)    Specific Gravity, Urine 1.004 (*)    Glucose, UA 150 (*)    Protein, ur >=300 (*)    All other components within normal limits  HEPATIC FUNCTION PANEL - Abnormal; Notable for the following components:   Albumin 2.4 (*)    All other components within normal limits  BRAIN NATRIURETIC PEPTIDE - Abnormal; Notable for the following components:   B  Natriuretic Peptide 610.0 (*)    All other components within normal limits  TROPONIN I (HIGH SENSITIVITY) - Abnormal; Notable for the following components:   Troponin I (High Sensitivity) 46 (*)    All other components within normal limits  TROPONIN I (HIGH SENSITIVITY) - Abnormal; Notable for the following components:   Troponin I (High Sensitivity) 44 (*)    All other components within normal limits    Otherwise labs showing:    Recent Labs  Lab 02/26/20 1302  NA 140  K 2.8*  CO2 22  GLUCOSE 257*  BUN 14  CREATININE 1.74*  CALCIUM 8.5*    Cr   stable,   Lab Results  Component Value Date   CREATININE 1.74 (H) 02/26/2020   CREATININE 1.72 (H) 01/23/2020   CREATININE 1.37 (H) 01/22/2020    Recent Labs  Lab 02/26/20 1302  AST 16  ALT 11  ALKPHOS 64  BILITOT 0.5  PROT 6.5  ALBUMIN 2.4*   Lab Results  Component Value Date   CALCIUM 8.5 (L) 02/26/2020     WBC      Component Value Date/Time   WBC 9.4 02/26/2020 1302   ANC    Component Value Date/Time   NEUTROABS 6.0 01/23/2020 0843     Plt: Lab Results  Component Value Date   PLT 369 02/26/2020     COVID-19 Labs  No results for input(s): DDIMER, FERRITIN, LDH, CRP in the last 72 hours.  Lab Results  Component Value Date   SARSCOV2NAA NEGATIVE 01/22/2020   [place] NEGATIVE 11/23/2019    HG/HCT  stable,      Component Value Date/Time   HGB 12.1 02/26/2020 1302   HGB 12.6 11/18/2013 0138   HCT 36.7 02/26/2020 1302   HCT 37.9 11/18/2013 0138       Troponinm 46 -44   ECG: Ordered Personally reviewed by me showing: HR : 71 Rhythm:   NSR    no evidence of ischemic changes [id number]   BNP (last 3 results) Recent Labs    02/26/20 1302  BNP 610.0*    ProBNP (last 3 results) No results for input(s): PROBNP in the last 8760 hours.  DM  labs:  HbA1C: Recent Labs    11/23/19 1831 01/22/20 0607  HGBA1C 14.6* 14.1*       CBG (last 3)  No results for input(s): GLUCAP in the last 72 hours.     UA   no evidence of UTI but proteinuria   Urine analysis:    Component Value Date/Time   COLORURINE STRAW (A) 02/26/2020 1620   APPEARANCEUR CLEAR (A) 02/26/2020 1620   LABSPEC 1.004 (L) 02/26/2020 1620   PHURINE 7.0 02/26/2020 1620   GLUCOSEU 150 (A) 02/26/2020 1620   HGBUR NEGATIVE 02/26/2020 1620   BILIRUBINUR NEGATIVE 02/26/2020 1620   KETONESUR NEGATIVE 02/26/2020 1620   PROTEINUR >=300 (A) 02/26/2020 1620   NITRITE NEGATIVE 02/26/2020 1620   LEUKOCYTESUR NEGATIVE 02/26/2020 1620      Ordered    CXR -vascular congestion     ED Triage Vitals  Enc Vitals Group     BP 02/26/20 1256 (!) 193/90     Pulse Rate 02/26/20 1256 74     Resp 02/26/20 1530 (!) 21     Temp 02/26/20 1256 97.9 F (36.6 C)     Temp Source 02/26/20 1256 Oral     SpO2 02/26/20 1256 99 %     Weight 02/26/20 1303 187 lb (84.8 kg)  Height 02/26/20 1303 5\' 6"  (1.676 m)     Head Circumference --      Peak Flow --      Pain Score 02/26/20 1300 6     Pain Loc --      Pain Edu? --      Excl. in [place]? --   TMAX(24)@       Latest  Blood pressure (!) 185/99, pulse 68, temperature 97.9 F (36.6 C), temperature source Oral, resp. rate 17, height 5\' 6"  (1.676 m), weight 84.8 kg, SpO2 98 %.     Review of Systems:    Pertinent positives include:    chest pain, , nausea, vomiting, diarrhea,   shortness of breath at rest.  dyspnea on exertion,Bilateral lower extremity swelling   Constitutional:  No weight loss, night sweats, Fevers, chills,  fatigue, weight loss  HEENT:  No headaches, Difficulty swallowing,Tooth/dental problems,Sore throat,  No sneezing, itching, ear ache, nasal congestion, post nasal drip,  Cardio-vascular:  No Orthopnea, PND, anasarca, dizziness, palpitations.no GI:  No heartburn, indigestion, abdominal painchange in bowel habits, loss of appetite, melena, blood in stool, hematemesis Resp:  no No excess mucus, no productive cough, No non-productive cough, No coughing up of blood.No change in color of mucus.No wheezing. Skin:  no rash or lesions. No jaundice GU:  no dysuria, change in color of urine, no urgency or frequency. No straining to urinate.  No flank pain.  Musculoskeletal:  No joint pain or no joint swelling. No decreased range of motion. No back pain.  Psych:  No change in mood or affect. No depression or anxiety. No memory loss.  Neuro: no localizing neurological complaints, no tingling, no weakness, no double vision, no gait abnormality, no slurred speech, no confusion  All systems reviewed and apart from [place] all are negative  Past Medical History:   Past Medical History:  Diagnosis Date  . Blind left eye   . Brain tumor ([place]) 1986  . Diabetes mellitus without complication ([place])   . Hyperlipidemia   . Hypertension   . MI (myocardial infarction) ([place]) 2018     Past Surgical History:  Procedure Laterality Date  . BRAIN SURGERY    . CESAREAN SECTION      Social History:  Ambulatory   cane,       reports that she has quit smoking. She has never used smokeless tobacco. She reports previous alcohol use. She reports previous drug use.   Family History:   Family History  Problem Relation Age of Onset  . CAD Mother   . CAD Brother     Allergies: Allergies  Allergen Reactions  . Shellfish Allergy Anaphylaxis    Shrimp/lobster  Shrimp/lobster    . Kiwi Extract Swelling  . Metformin Other (See Comments)    Other reaction(s): GI Intolerance     Prior to Admission  medications   Medication Sig Start Date End Date Taking? Authorizing Provider  amLODipine (NORVASC) 5 MG tablet Take 1 tablet (5 mg total) by mouth 2 (two) times daily. 11/26/19   Lavina Hamman, MD  aspirin EC 81 MG EC tablet Take 1 tablet (81 mg total) by mouth daily. 11/26/19   Lavina Hamman, MD  atorvastatin (LIPITOR) 80 MG tablet Take 1 tablet (80 mg total) by mouth daily at 6 PM. 11/26/19   Lavina Hamman, MD  Fluticasone-Salmeterol (ADVAIR) 250-50 MCG/DOSE AEPB Inhale 1 puff into the lungs 2 (two) times daily.    [provider]  hydrALAZINE (APRESOLINE)  50 MG tablet Take 1 tablet (50 mg total) by mouth 3 (three) times daily. 11/26/19   Lavina Hamman, MD  insulin aspart protamine- aspart (NOVOLOG MIX 70/30) (70-30) 100 UNIT/ML injection Inject 0.4 mLs (40 Units total) into the skin 2 (two) times daily with a meal. 11/26/19   Lavina Hamman, MD  isosorbide mononitrate (IMDUR) 60 MG 24 hr tablet Take 1 tablet (60 mg total) by mouth daily. 01/24/20   Max Sane, MD  methocarbamol (ROBAXIN) 500 MG tablet Take 1 tablet (500 mg total) by mouth every 8 (eight) hours as needed for muscle spasms. 11/26/19   Lavina Hamman, MD  metoprolol tartrate (LOPRESSOR) 25 MG tablet Take 1 tablet (25 mg total) by mouth 2 (two) times daily. 11/26/19   Lavina Hamman, MD  mirtazapine (REMERON) 30 MG tablet Take 1 tablet (30 mg total) by mouth at bedtime. 11/26/19   Lavina Hamman, MD  topiramate (TOPAMAX) 25 MG capsule Take 25 mg by mouth daily.    [provider]  torsemide (DEMADEX) 10 MG tablet Take 10 mg by mouth daily.    [provider]  valACYclovir (VALTREX) 500 MG tablet Take 1 tablet by mouth 2 (two) times daily. 05/20/17   [provider]   Physical Exam: Blood pressure (!) 185/99, pulse 68, temperature 97.9 F (36.6 C), temperature source Oral, resp. rate 17, height 5\' 6"  (1.676 m), weight 84.8 kg, SpO2 98 %. 1. General:  in No  Acute distress   Chronically ill  -appearing 2. Psychological: Alert and   Oriented 3. Head/ENT:   Moist  Mucous Membranes                          Head Non traumatic, neck supple                          Poor Dentition 4. SKIN:  decreased Skin turgor,  Skin clean Dry and intact no rash 5. Heart: Regular rate and rhythm no Murmur, no Rub or gallop 6. Lungs: no wheezes some crackles   7. Abdomen: Soft,  non-tender,   Distended obese  bowel sounds present 8. Lower extremities: no clubbing, cyanosis, edema 9. Neurologically Grossly intact, moving all 4 extremities equally   10. MSK: Normal range of motion   All other LABS:     Recent Labs  Lab 02/26/20 1302  WBC 9.4  HGB 12.1  HCT 36.7  MCV 85.9  PLT 369     Recent Labs  Lab 02/26/20 1302  NA 140  K 2.8*  CL 105  CO2 22  GLUCOSE 257*  BUN 14  CREATININE 1.74*  CALCIUM 8.5*     Recent Labs  Lab 02/26/20 1302  AST 16  ALT 11  ALKPHOS 64  BILITOT 0.5  PROT 6.5  ALBUMIN 2.4*       Cultures:    Component Value Date/Time   SDES  01/21/2020 2333    URINE, CATHETERIZED Performed at [facility], [address] [place], [place] [address]    [facility]  01/21/2020 2333    NONE Performed at [facility], [address] [place], [place] [address]    CULT  01/21/2020 2333    NO GROWTH Performed at [facility], [place] [address] [place], [place] [address]    REPTSTATUS 01/23/2020 FINAL 01/21/2020 2333     Radiological Exams on Admission: DG Chest  2 View  Result Date: 02/26/2020 CLINICAL DATA:  Chest pain. EXAM: CHEST - 2 VIEW COMPARISON:  November 23, 2019 FINDINGS: Stable mild cardiomegaly. The hila and mediastinum are normal. No pneumothorax. No nodules or masses. No focal infiltrates or overt edema. Mild pulmonary venous congestion suggested. IMPRESSION: Possible mild pulmonary venous congestion. No other acute abnormalities. Electronically Signed   By: Dorise Bullion III M.D   On: 02/26/2020 13:58    Chart has  been reviewed    Assessment/Plan   59 y.o. female with medical history significant of DM2, COPD, CAD, depression, OSA, HTN, sciatica, Hx of Stroke, HLD, brain tumor status post resection and left eye blindness     Admitted for    hypertensive urgency hypokalemia and acute on chronic systolic CHF exacerbation     Present on Admission: . Nonspecific chest pain - continue to cycle CE likely in the setting of hypertensive urgency   CTA  - no evidence of PE, cardiomegaly noted Acute on chronic systolic CHF exacerbation -during last admission EF was less than 30% on Myoview will need follow-up with cardiology and further work-up Diuresis and obtain echogram to confirm EF Check TSH, HIV nonreactive in December 2020 Myoview showed no evidence of acute ischemia   . Hypertensive urgency-restart home medications avoid over aggressive drop in blood pressures.  Of note hydralazine was not on her new medication list although she was discharged on it. She developed alergic reaction to hydralazine and was taken off it Will need long-term management of hypertension Consult cardiology  . CKD (chronic kidney disease), stage III -avoid nephrotoxic medications patient will likely need to be referred to nephrology given significant proteinuria   . Hypokalemia- - will replace and repeat in AM,  check magnesium level and replace as needed  . Anasarca-complicated in the setting of chronic systolic CHF, hypoalbuminemia and possibly nephrotic syndrome.  Will diurese and monitor kidney   Dm 2-  - Order Sensitive  SSI   -  switch to   Lantus 50units,  -  check TSH and HgA1C  - Hold by mouth medications    . Proteinuria -will need nephrology follow-up  . Hypoalbuminemia -in the setting of renal disease diabetes no evidence of liver failure.  Will need follow-up with nephrology to further evaluate Is is can contribute to fluid accumulation Order prealbumin and nutritional consult   . Elevated troponin -in  the setting of CHF and hypertensive urgency.  Stable continue to follow  OSA -pt states she  Is not on CPAP  Other plan as per orders.  DVT prophylaxis:  Lovenox     Code Status:  FULL CODE  as per patient  I had personally discussed CODE STATUS with patient    Family Communication:   Family not at  Bedside    Disposition Plan:    To home once workup is complete and patient is stable  Will need improved BP control and set up to see cardiology                     Would benefit from PT/OT eval prior to DC  Ordered                                    Nutrition    consulted  Consults called:  Notified [facility] cardiology through smartchat   Admission status:  ED Disposition    ED Disposition Condition Comment   Admit  The patient appears reasonably stabilized for admission considering the current resources, flow, and capabilities available in the ED at this time, and I doubt any other [facility] requiring further screening and/or treatment in the ED prior to admission is  present.       Obs     Level of care    tele  For   24H        Precautions: admitted as  asymptomatic screening protocol    PPE: Used by the provider:   P100  eye Goggles,  Gloves      Jemima Petko 02/26/2020, 9:49 PM    [facility]     after 2 AM please page floor coverage PA If 7AM-7PM, please contact the day team taking care of the patient using [email or web address]   Patient was evaluated in the context of the global COVID-19 pandemic, which necessitated consideration that the patient might be at risk for infection with the SARS-CoV-2 virus that causes COVID-19. Institutional protocols and algorithms that pertain to the evaluation of patients at risk for COVID-19 are in a state of rapid change based on information released by regulatory bodies including the CDC and federal and state organizations. These policies and algorithms were followed during the patient's care.

## 2020-02-26 NOTE — ED Triage Notes (Addendum)
Pt comes via POV from home with c/o arm swelling, dizziness, left sided chest pain and arm pain. Pt states a warm feeling in her chest. Pt has noticeable swelling to left arm and hand. Pt states pain in arm as well.  Pt states this started awhile back. Pt states N/V/D.  Pt states she gets SOB when she walks.

## 2020-02-26 NOTE — ED Notes (Signed)
[place] sent to lab

## 2020-02-27 ENCOUNTER — Encounter: Payer: Self-pay | Admitting: Internal Medicine

## 2020-02-27 ENCOUNTER — Other Ambulatory Visit: Payer: Self-pay

## 2020-02-27 ENCOUNTER — Observation Stay
Admit: 2020-02-27 | Discharge: 2020-02-27 | Disposition: A | Discharge status: Home / Self Care | Payer: Medicare HMO | Attending: Internal Medicine | Admitting: Internal Medicine

## 2020-02-27 DIAGNOSIS — N179 Acute kidney failure, unspecified: Secondary | ICD-10-CM | POA: Diagnosis present

## 2020-02-27 DIAGNOSIS — I5023 Acute on chronic systolic (congestive) heart failure: Secondary | ICD-10-CM

## 2020-02-27 DIAGNOSIS — H5462 Unqualified visual loss, left eye, normal vision right eye: Secondary | ICD-10-CM | POA: Diagnosis present

## 2020-02-27 DIAGNOSIS — Z7982 Long term (current) use of aspirin: Secondary | ICD-10-CM | POA: Diagnosis not present

## 2020-02-27 DIAGNOSIS — I252 Old myocardial infarction: Secondary | ICD-10-CM | POA: Diagnosis not present

## 2020-02-27 DIAGNOSIS — I130 Hypertensive heart and chronic kidney disease with heart failure and stage 1 through stage 4 chronic kidney disease, or unspecified chronic kidney disease: Secondary | ICD-10-CM | POA: Diagnosis present

## 2020-02-27 DIAGNOSIS — Z87891 Personal history of nicotine dependence: Secondary | ICD-10-CM | POA: Diagnosis not present

## 2020-02-27 DIAGNOSIS — R600 Localized edema: Secondary | ICD-10-CM

## 2020-02-27 DIAGNOSIS — N1831 Chronic kidney disease, stage 3a: Secondary | ICD-10-CM

## 2020-02-27 DIAGNOSIS — F329 Major depressive disorder, single episode, unspecified: Secondary | ICD-10-CM | POA: Diagnosis present

## 2020-02-27 DIAGNOSIS — N1832 Chronic kidney disease, stage 3b: Secondary | ICD-10-CM | POA: Diagnosis present

## 2020-02-27 DIAGNOSIS — Z888 Allergy status to other drugs, medicaments and biological substances status: Secondary | ICD-10-CM | POA: Diagnosis not present

## 2020-02-27 DIAGNOSIS — Z794 Long term (current) use of insulin: Secondary | ICD-10-CM | POA: Diagnosis not present

## 2020-02-27 DIAGNOSIS — E1122 Type 2 diabetes mellitus with diabetic chronic kidney disease: Secondary | ICD-10-CM | POA: Diagnosis present

## 2020-02-27 DIAGNOSIS — N141 Nephropathy induced by other drugs, medicaments and biological substances: Secondary | ICD-10-CM | POA: Diagnosis present

## 2020-02-27 DIAGNOSIS — E785 Hyperlipidemia, unspecified: Secondary | ICD-10-CM | POA: Diagnosis present

## 2020-02-27 DIAGNOSIS — Z79899 Other long term (current) drug therapy: Secondary | ICD-10-CM | POA: Diagnosis not present

## 2020-02-27 DIAGNOSIS — Z20822 Contact with and (suspected) exposure to covid-19: Secondary | ICD-10-CM | POA: Diagnosis present

## 2020-02-27 DIAGNOSIS — I161 Hypertensive emergency: Secondary | ICD-10-CM

## 2020-02-27 DIAGNOSIS — R601 Generalized edema: Secondary | ICD-10-CM

## 2020-02-27 DIAGNOSIS — Z91018 Allergy to other foods: Secondary | ICD-10-CM | POA: Diagnosis not present

## 2020-02-27 DIAGNOSIS — M79669 Pain in unspecified lower leg: Secondary | ICD-10-CM | POA: Diagnosis not present

## 2020-02-27 DIAGNOSIS — Z91013 Allergy to seafood: Secondary | ICD-10-CM | POA: Diagnosis not present

## 2020-02-27 DIAGNOSIS — E1165 Type 2 diabetes mellitus with hyperglycemia: Secondary | ICD-10-CM | POA: Diagnosis present

## 2020-02-27 DIAGNOSIS — I509 Heart failure, unspecified: Secondary | ICD-10-CM

## 2020-02-27 DIAGNOSIS — I5043 Acute on chronic combined systolic (congestive) and diastolic (congestive) heart failure: Secondary | ICD-10-CM

## 2020-02-27 DIAGNOSIS — E871 Hypo-osmolality and hyponatremia: Secondary | ICD-10-CM | POA: Diagnosis present

## 2020-02-27 DIAGNOSIS — E8809 Other disorders of plasma-protein metabolism, not elsewhere classified: Secondary | ICD-10-CM | POA: Diagnosis present

## 2020-02-27 DIAGNOSIS — I69354 Hemiplegia and hemiparesis following cerebral infarction affecting left non-dominant side: Secondary | ICD-10-CM | POA: Diagnosis not present

## 2020-02-27 DIAGNOSIS — Y92239 Unspecified place in hospital as the place of occurrence of the external cause: Secondary | ICD-10-CM | POA: Diagnosis present

## 2020-02-27 DIAGNOSIS — T508X5A Adverse effect of diagnostic agents, initial encounter: Secondary | ICD-10-CM | POA: Diagnosis present

## 2020-02-27 LAB — GLUCOSE, CAPILLARY
Glucose-Capillary: 109 mg/dL — ABNORMAL HIGH (ref 70–99)
Glucose-Capillary: 145 mg/dL — ABNORMAL HIGH (ref 70–99)
Glucose-Capillary: 180 mg/dL — ABNORMAL HIGH (ref 70–99)
Glucose-Capillary: 184 mg/dL — ABNORMAL HIGH (ref 70–99)
Glucose-Capillary: 200 mg/dL — ABNORMAL HIGH (ref 70–99)
Glucose-Capillary: 220 mg/dL — ABNORMAL HIGH (ref 70–99)

## 2020-02-27 LAB — ECHOCARDIOGRAM COMPLETE
Height: 66 in
Weight: 2860.79 oz

## 2020-02-27 LAB — COMPREHENSIVE METABOLIC PANEL
ALT: 10 U/L (ref 0–44)
AST: 13 U/L — ABNORMAL LOW (ref 15–41)
Albumin: 2.3 g/dL — ABNORMAL LOW (ref 3.5–5.0)
Alkaline Phosphatase: 70 U/L (ref 38–126)
Anion gap: 7 (ref 5–15)
BUN: 17 mg/dL (ref 6–20)
CO2: 26 mmol/L (ref 22–32)
Calcium: 8.1 mg/dL — ABNORMAL LOW (ref 8.9–10.3)
Chloride: 109 mmol/L (ref 98–111)
Creatinine, Ser: 1.69 mg/dL — ABNORMAL HIGH (ref 0.44–1.00)
GFR calc Af Amer: 38 mL/min — ABNORMAL LOW (ref >60)
GFR calc non Af Amer: 33 mL/min — ABNORMAL LOW (ref >60)
Glucose, Bld: 239 mg/dL — ABNORMAL HIGH (ref 70–99)
Potassium: 2.9 mmol/L — ABNORMAL LOW (ref 3.5–5.1)
Sodium: 142 mmol/L (ref 135–145)
Total Bilirubin: 0.8 mg/dL (ref 0.3–1.2)
Total Protein: 6.2 g/dL — ABNORMAL LOW (ref 6.5–8.1)

## 2020-02-27 LAB — TSH: TSH: 2.685 u[IU]/mL (ref 0.350–4.500)

## 2020-02-27 LAB — CBC
HCT: 35.1 % — ABNORMAL LOW (ref 36.0–46.0)
Hemoglobin: 11.7 g/dL — ABNORMAL LOW (ref 12.0–15.0)
MCH: 28.9 pg (ref 26.0–34.0)
MCHC: 33.3 g/dL (ref 30.0–36.0)
MCV: 86.7 fL (ref 80.0–100.0)
Platelets: 372 10*3/uL (ref 150–400)
RBC: 4.05 MIL/uL (ref 3.87–5.11)
RDW: 14.6 % (ref 11.5–15.5)
WBC: 10.2 10*3/uL (ref 4.0–10.5)
nRBC: 0 % (ref 0.0–0.2)

## 2020-02-27 LAB — TROPONIN I (HIGH SENSITIVITY)
Troponin I (High Sensitivity): 41 ng/L — ABNORMAL HIGH (ref <18)
Troponin I (High Sensitivity): 43 ng/L — ABNORMAL HIGH (ref <18)

## 2020-02-27 LAB — MAGNESIUM: Magnesium: 1.6 mg/dL — ABNORMAL LOW (ref 1.7–2.4)

## 2020-02-27 LAB — PHOSPHORUS: Phosphorus: 4.4 mg/dL (ref 2.5–4.6)

## 2020-02-27 LAB — HEMOGLOBIN A1C
Hgb A1c MFr Bld: 10.2 % — ABNORMAL HIGH (ref 4.8–5.6)
Mean Plasma Glucose: 246.04 mg/dL

## 2020-02-27 LAB — PREALBUMIN: Prealbumin: 35.4 mg/dL (ref 18–38)

## 2020-02-27 LAB — SARS CORONAVIRUS 2 (TAT 6-24 HRS): SARS Coronavirus 2: NEGATIVE

## 2020-02-27 MED ORDER — ENOXAPARIN SODIUM 40 MG/0.4ML ~~LOC~~ SOLN
40.0000 mg | SUBCUTANEOUS | Status: DC
  Administered 2020-02-27: 40 mg via SUBCUTANEOUS
  Filled 2020-02-27: qty 0.4

## 2020-02-27 MED ORDER — POTASSIUM CHLORIDE CRYS ER 20 MEQ PO TBCR
40.0000 meq | EXTENDED_RELEASE_TABLET | ORAL | Status: AC
  Administered 2020-02-27 (×2): 40 meq via ORAL
  Filled 2020-02-27: qty 2

## 2020-02-27 MED ORDER — PERFLUTREN LIPID MICROSPHERE
1.0000 mL | INTRAVENOUS | Status: AC | PRN
  Administered 2020-02-27: 2 mL via INTRAVENOUS
  Filled 2020-02-27: qty 10

## 2020-02-27 MED ORDER — MAGNESIUM SULFATE 2 GM/50ML IV SOLN
2.0000 g | Freq: Once | INTRAVENOUS | Status: AC
  Administered 2020-02-27: 2 g via INTRAVENOUS
  Filled 2020-02-27: qty 50

## 2020-02-27 MED ORDER — AMLODIPINE BESYLATE 10 MG PO TABS
10.0000 mg | ORAL_TABLET | Freq: Every day | ORAL | Status: DC
  Administered 2020-02-27 – 2020-03-02 (×5): 10 mg via ORAL
  Filled 2020-02-27 (×5): qty 1

## 2020-02-27 MED ORDER — DIPHENHYDRAMINE HCL 25 MG PO CAPS
25.0000 mg | ORAL_CAPSULE | Freq: Four times a day (QID) | ORAL | Status: DC | PRN
  Administered 2020-02-27 (×2): 25 mg via ORAL
  Filled 2020-02-27 (×2): qty 1

## 2020-02-27 NOTE — Progress Notes (Signed)
*  PRELIMINARY RESULTS* Echocardiogram 2D Echocardiogram has been performed.  Wallie Char Magaline Steinberg 02/27/2020, 11:23 AM

## 2020-02-27 NOTE — Care Management Obs Status (Signed)
[place] NOTIFICATION   Patient Details  Name: Terry Arroyo MRN: [id number] Date of Birth: 22-Oct-1961   Medicare Observation Status Notification Given:  Yes    Rollande Thursby A Andriy Sherk, LCSW 02/27/2020, 10:33 AM

## 2020-02-27 NOTE — Clinical Social Work Note (Signed)
1.  Do you have a scale? Yes 2.  Do you weigh yourself daily? Yes 3.  Two pounds over night or 5 in one week call your doctor because it could be fluid overload? pt aware 4.  Do you go to the Heart Failure Clinic? yes 5. An appointment with heart failure clinic will be made before discharge if. Follow up scheduled for 3/18 If patient is home bound, home health can be arranged with an agency that provides a heart failure protocol.   [place], [place]

## 2020-02-27 NOTE — Progress Notes (Signed)
Inpatient Diabetes Program Recommendations  AACE/ADA: New Consensus Statement on Inpatient Glycemic Control (2015)  Target Ranges:  Prepandial:   less than 140 mg/dL      Peak postprandial:   less than 180 mg/dL (1-2 hours)      Critically ill patients:  140 - 180 mg/dL   Lab Results  Component Value Date   GLUCAP 109 (H) 02/27/2020   HGBA1C 10.2 (H) 02/27/2020    Review of Glycemic Control Results for Terry Arroyo, Terry Arroyo (MRN [id number]) as of 02/27/2020 11:38  Ref. Range 02/26/2020 19:56 02/26/2020 23:42 02/27/2020 00:50 02/27/2020 05:50 02/27/2020 08:10  Glucose-Capillary Latest Ref Range: 70 - 99 mg/dL 192 (H) 212 (H) 220 (H) 145 (H) 109 (H)   Diabetes history: DM2 Outpatient Diabetes medications: Novolog 70/30 mix 40 units tid ac meals Current orders for Inpatient glycemic control: Lantus 50 units qd + Novolog sensitive correction q 4 hrs.  Inpatient Diabetes Program Recommendations:   Spoke with patient @ length regarding insulin @ home and nutrition. Patient noted her CBGs were remaining high, so added the third insulin dose @ noon before lunch. Patient said her PCP has left the practice she was seeing but has an appointment March 15th with new PCP. Patient agrees to take log of CBGs with her to her appointment and discuss insulin regimen with MD. Reviewed with patient that the Novolog 70/30 is normally taken ac breakfast & dinner, but patient reports no hypoglycemic readings or symptoms.Reviewed Hypoglycemic protocol with patient. A1c has decreased from  14.1 on  01/22/20 to current 10.2.  May need to increase 70/30 bid dosing on discharge.  Thank you, Terry Arroyo. Terry Trickett, RN, MSN, CDE  Diabetes Coordinator Inpatient Glycemic Control Team Team Pager [phone] (8am-5pm) 02/27/2020 11:58 AM

## 2020-02-27 NOTE — Evaluation (Signed)
Physical Therapy Evaluation Patient Details Name: Terry Arroyo MRN: [id number] DOB: 09-15-1961 Today's Date: 02/27/2020   History of Present Illness  Pt is a 59 y.o. female presenting to hospital 02/26/20 with worsening swelling in extremities and face, exertional SOB, lightheaded, warm feeling in chest, N/V, and some diarrhea.  Pt admitted with acute on chronic combined diastolic and systolic CHF, nonspecific chest pain, hypertensive urgency, CKD, hypokalemia, anasarca, DM, proteinuria, hypoalbuminemia, elevated troponin, and OSA.  PMH includes CHF, L eye blindness, brain tumor, DM, htn, MI, h/o brain surgery.  Clinical Impression  Prior to hospital admission, pt was modified independent ambulating with [facility]; lives with her father in level entry home (1 level).  Currently pt is modified independent with bed mobility; SBA with transfers; and CGA ambulating 200 feet with SPC (decreased stance time noted R LE).  HR 73-74 bpm and O2 sats 97% or greater on room air with ambulation.  Pt reporting low back pain laying in bed beginning of session and 5/10 LBP end of session (pt reports h/o hurting it lifting 24 pack of waters): nurse notified pt requesting pain medication.  Pt would benefit from skilled PT to address noted impairments and functional limitations (see below for any additional details).  Upon hospital discharge, no further PT needs anticipated.    Follow Up Recommendations No PT follow up    Equipment Recommendations  Cane    Recommendations for Other Services       Precautions / Restrictions Precautions Precautions: Fall Restrictions Weight Bearing Restrictions: No      Mobility  Bed Mobility Overal bed mobility: Modified Independent             General bed mobility comments: Semi-supine to/from sitting without any noted difficulties  Transfers Overall transfer level: Needs assistance Equipment used: Straight cane Transfers: Sit to/from Stand Sit to Stand:  Supervision Stand pivot transfers: Min guard       General transfer comment: fairly strong stand from bed; steady with SPC use  Ambulation/Gait Ambulation/Gait assistance: Min guard Gait Distance (Feet): 200 Feet Assistive device: Straight cane   Gait velocity: decreased   General Gait Details: antalgic; decreased stance time R LE; partial step through gait pattern; steady with SPC use  Stairs            Wheelchair Mobility    Modified Rankin (Stroke Patients Only)       Balance Overall balance assessment: Needs assistance Sitting-balance support: No upper extremity supported;Feet supported Sitting balance-Leahy Scale: Normal Sitting balance - Comments: steady sitting reaching within BOS   Standing balance support: No upper extremity supported Standing balance-Leahy Scale: Good Standing balance comment: steady standing reaching within BOS                             Pertinent Vitals/Pain Pain Assessment: 0-10 Pain Score: 5  Pain Location: low back Pain Descriptors / Indicators: Aching;Sore Pain Intervention(s): Limited activity within patient's tolerance;Monitored during session;Repositioned;Patient requesting pain meds-RN notified     Home Living Family/patient expects to be discharged to:: Private residence Living Arrangements: Parent(Pt's father) Available Help at Discharge: Family;Available PRN/intermittently Type of Home: House Home Access: Level entry     Home Layout: One level Home Equipment: Bedside commode;Walker - 2 wheels;Cane - single point;Shower seat;Grab bars - tub/shower Additional Comments: Pt lives with father who is 29 but very high functioning and able to assist as needed.  Performs community tasks including driving. Pt reports she always  uses SPC for functional mobility.    Prior Function Level of Independence: Independent with assistive device(s)         Comments: Ambulatory with SPC.  H/o fall last month when leg  "gave out".     Hand Dominance   Dominant Hand: Left    Extremity/Trunk Assessment   Upper Extremity Assessment Upper Extremity Assessment: Generalized weakness    Lower Extremity Assessment Lower Extremity Assessment: Generalized weakness;LLE deficits/detail;RLE deficits/detail RLE Deficits / Details: hip flexion 4/5; knee flexion/extension 4/5; DF/PF at least 4/5 LLE Deficits / Details: hip flexion 4+/5; knee flexion/extension 4/5; DF/PF at least 4/5    Cervical / Trunk Assessment Cervical / Trunk Assessment: Normal  Communication   Communication: No difficulties  Cognition Arousal/Alertness: Awake/alert Behavior During Therapy: WFL for tasks assessed/performed Overall Cognitive Status: Within Functional Limits for tasks assessed                                 General Comments: A&Ox4      General Comments General comments (skin integrity, edema, etc.): B LE edema noted.  Nursing cleared pt for participation in physical therapy.  Pt agreeable to PT session.    Exercises    Assessment/Plan    PT Assessment Patient needs continued PT services  PT Problem List Decreased strength;Decreased activity tolerance;Decreased balance;Decreased mobility;Pain       PT Treatment Interventions DME instruction;Gait training;Functional mobility training;Therapeutic activities;Therapeutic exercise;Balance training;Patient/family education    PT Goals (Current goals can be found in the Care Plan section)  Acute Rehab PT Goals Patient Stated Goal: to feel better PT Goal Formulation: With patient Time For Goal Achievement: 03/12/20 Potential to Achieve Goals: Good    Frequency Min 2X/week   Barriers to discharge        Co-evaluation               AM-PAC PT "6 Clicks" Mobility  Outcome Measure Help needed turning from your back to your side while in a flat bed without using bedrails?: None Help needed moving from lying on your back to sitting on the side  of a flat bed without using bedrails?: None Help needed moving to and from a bed to a chair (including a wheelchair)?: A Little Help needed standing up from a chair using your arms (e.g., wheelchair or bedside chair)?: A Little Help needed to walk in hospital room?: A Little Help needed climbing 3-5 steps with a railing? : A Little 6 Click Score: 20    End of Session Equipment Utilized During Treatment: Gait belt Activity Tolerance: Patient tolerated treatment well Patient left: in bed;with call bell/phone within reach;with bed alarm set Nurse Communication: Mobility status;Precautions;Patient requests pain meds PT Visit Diagnosis: Other abnormalities of gait and mobility (R26.89);Muscle weakness (generalized) (M62.81);History of falling (Z91.81);Difficulty in walking, not elsewhere classified (R26.2)    Time: 3734-2876 PT Time Calculation (min) (ACUTE ONLY): 21 min   Charges:   PT Evaluation $PT Eval Low Complexity: 1 Low PT Treatments $Therapeutic Exercise: 8-22 mins        Leitha Bleak, PT 02/27/20, 1:53 PM

## 2020-02-27 NOTE — Plan of Care (Signed)
  Problem: Education: Goal: Knowledge of General Education information will improve Description: Including pain rating scale, medication(s)/side effects and non-pharmacologic comfort measures Outcome: Completed/Met

## 2020-02-27 NOTE — Consult Note (Signed)
[facility] Cardiology  CARDIOLOGY CONSULT NOTE  Patient ID: Terry Arroyo MRN: [id number] DOB/AGE: 05-30-1961 59 y.o.  Admit date: 02/26/2020 Referring Physician [place] Primary Physician Dina Rich Cardiologist Nehemiah Massed Reason for Consultation congestive heart failure  HPI: 59 year old female referred for evaluation of congestive heart failure.  Patient has known history of coronary artery disease, status post CABG 2018 while living in [place].  Patient moved to [facility] 2019.  She presents with chief complaint of generalized swelling, with peripheral edema and hands and lower extremities.  Emergency room, patient was markedly hypertensive with blood pressure 189/93.  ECG revealed normal sinus rhythm at a rate of 67 bpm, with old anteroseptal MI and nonspecific ST-T wave abnormalities.  Patient denies chest pain.  Admission labs notable for borderline elevated high-sensitivity troponin of 41 and 43.  Labs also notable for BUN and creatinine of 17 and 1.69, respectively.  Prior 2D echocardiogram 11/24/2019 revealed LVEF of 55 to 60%.  Recent Lexiscan sestamibi study 2//2021 revealed LVEF of 30% with inferior and apical scar with minimal peri-infarct ischemia.  The patient was treated with intravenous furosemide, with initial diuresis, and modest clinical improvement.  Review of systems complete and found to be negative unless listed above     Past Medical History:  Diagnosis Date  . Blind left eye   . Brain tumor ([place]) 1986  . Diabetes mellitus without complication ([place])   . Hyperlipidemia   . Hypertension   . MI (myocardial infarction) ([place]) 2018    Past Surgical History:  Procedure Laterality Date  . BRAIN SURGERY    . CESAREAN SECTION      Medications Prior to Admission  Medication Sig Dispense Refill Last Dose  . amLODipine (NORVASC) 5 MG tablet Take 1 tablet (5 mg total) by mouth 2 (two) times daily. (Patient taking differently: Take 5 mg by mouth daily. ) 60 tablet 0  02/26/2020 at 1000  . aspirin EC 81 MG EC tablet Take 1 tablet (81 mg total) by mouth daily. 30 tablet 0 02/26/2020 at 1000  . atorvastatin (LIPITOR) 80 MG tablet Take 1 tablet (80 mg total) by mouth daily at 6 PM. (Patient taking differently: Take 80 mg by mouth daily. ) 30 tablet 0 02/26/2020 at 1000  . Fluticasone-Salmeterol (ADVAIR) 250-50 MCG/DOSE AEPB Inhale 1 puff into the lungs 2 (two) times daily.   02/26/2020 at 1000  . furosemide (LASIX) 20 MG tablet Take 20 mg by mouth daily as needed for edema.   prn at prn  . insulin aspart protamine- aspart (NOVOLOG MIX 70/30) (70-30) 100 UNIT/ML injection Inject 0.4 mLs (40 Units total) into the skin 2 (two) times daily with a meal. (Patient taking differently: Inject 40 Units into the skin 3 (three) times daily. ) 10 mL 0 02/26/2020 at 1000  . isosorbide mononitrate (IMDUR) 60 MG 24 hr tablet Take 1 tablet (60 mg total) by mouth daily. (Patient taking differently: Take 30 mg by mouth daily. ) 30 tablet 0 02/26/2020 at 1000  . lisinopril (ZESTRIL) 30 MG tablet Take 30 mg by mouth daily.   02/26/2020 at 1000  . methocarbamol (ROBAXIN) 500 MG tablet Take 1 tablet (500 mg total) by mouth every 8 (eight) hours as needed for muscle spasms. 15 tablet 0 prn at prn  . metoprolol tartrate (LOPRESSOR) 25 MG tablet Take 1 tablet (25 mg total) by mouth 2 (two) times daily. 60 tablet 0 02/26/2020 at 1000  . mirtazapine (REMERON) 30 MG tablet Take 1 tablet (30 mg  total) by mouth at bedtime. 30 tablet 0 02/25/2020 at Unknown time  . topiramate (TOPAMAX) 25 MG capsule Take 25 mg by mouth daily.   02/26/2020 at 1000  . valACYclovir (VALTREX) 500 MG tablet Take 1 tablet by mouth 2 (two) times daily.   02/26/2020 at 1000   Social History   Socioeconomic History  . Marital status: Single    Spouse name: Not on file  . Number of children: Not on file  . Years of education: Not on file  . Highest education level: Not on file  Occupational History  . Not on file  Tobacco Use  . Smoking  status: Former Research scientist (life sciences)  . Smokeless tobacco: Never Used  Substance and Sexual Activity  . Alcohol use: Not Currently  . Drug use: Not Currently  . Sexual activity: Not on file  Other Topics Concern  . Not on file  Social History Narrative  . Not on file   Social Determinants of Health   Financial Resource Strain:   . Difficulty of Paying Living Expenses: Not on file  Food Insecurity:   . Worried About Charity fundraiser in the Last Year: Not on file  . Ran Out of Food in the Last Year: Not on file  Transportation Needs:   . Lack of Transportation (Medical): Not on file  . Lack of Transportation (Non-Medical): Not on file  Physical Activity:   . Days of Exercise per Week: Not on file  . Minutes of Exercise per Session: Not on file  Stress:   . Feeling of Stress : Not on file  Social Connections:   . Frequency of Communication with Friends and Family: Not on file  . Frequency of Social Gatherings with Friends and Family: Not on file  . Attends Religious Services: Not on file  . Active Member of Clubs or Organizations: Not on file  . Attends Archivist Meetings: Not on file  . Marital Status: Not on file  Intimate Partner Violence:   . Fear of Current or Ex-Partner: Not on file  . Emotionally Abused: Not on file  . Physically Abused: Not on file  . Sexually Abused: Not on file    Family History  Problem Relation Age of Onset  . CAD Mother   . CAD Brother       Review of systems complete and found to be negative unless listed above      PHYSICAL EXAM  General: Well developed, well nourished, in no acute distress HEENT:  Normocephalic and atramatic Neck:  No JVD.  Lungs: Clear bilaterally to auscultation and percussion. Heart: HRRR . Normal S1 and S2 without gallops or murmurs.  Abdomen: Bowel sounds are positive, abdomen soft and non-tender  Msk:  Back normal, normal gait. Normal strength and tone for age. Extremities: No clubbing, cyanosis or edema.    Neuro: Alert and oriented X 3. Psych:  Good affect, responds appropriately  Labs:   Lab Results  Component Value Date   WBC 10.2 02/27/2020   HGB 11.7 (L) 02/27/2020   HCT 35.1 (L) 02/27/2020   MCV 86.7 02/27/2020   PLT 372 02/27/2020    Recent Labs  Lab 02/27/20 0101  NA 142  K 2.9*  CL 109  CO2 26  BUN 17  CREATININE 1.69*  CALCIUM 8.1*  PROT 6.2*  BILITOT 0.8  ALKPHOS 70  ALT 10  AST 13*  GLUCOSE 239*   No results found for: CKTOTAL, CKMB, CKMBINDEX, TROPONINI  Lab Results  Component Value Date   CHOL 290 (H) 01/22/2020   CHOL 316 (H) 11/24/2019   Lab Results  Component Value Date   HDL 46 01/22/2020   HDL 39 (L) 11/24/2019   Lab Results  Component Value Date   LDLCALC UNABLE TO CALCULATE IF TRIGLYCERIDE OVER 400 mg/dL 01/22/2020   LDLCALC UNABLE TO CALCULATE IF TRIGLYCERIDE OVER 400 mg/dL 11/24/2019   Lab Results  Component Value Date   TRIG 453 (H) 01/22/2020   TRIG 564 (H) 11/24/2019   Lab Results  Component Value Date   CHOLHDL 6.3 01/22/2020   CHOLHDL 8.1 11/24/2019   Lab Results  Component Value Date   LDLDIRECT 145.2 (H) 01/22/2020   LDLDIRECT 140.0 (H) 11/24/2019      Radiology: DG Chest 2 View  Result Date: 02/26/2020 CLINICAL DATA:  Chest pain. EXAM: CHEST - 2 VIEW COMPARISON:  November 23, 2019 FINDINGS: Stable mild cardiomegaly. The hila and mediastinum are normal. No pneumothorax. No nodules or masses. No focal infiltrates or overt edema. Mild pulmonary venous congestion suggested. IMPRESSION: Possible mild pulmonary venous congestion. No other acute abnormalities. Electronically Signed   By: Dorise Bullion III M.D   On: 02/26/2020 13:58   CT ANGIO CHEST PE W OR WO CONTRAST  Result Date: 02/26/2020 CLINICAL DATA:  59 year old female with increased lower extremity swelling, shortness of breath, flank pain. EXAM: CT ANGIOGRAPHY CHEST WITH CONTRAST TECHNIQUE: Multidetector CT imaging of the chest was performed using the standard  protocol during bolus administration of intravenous contrast. Multiplanar CT image reconstructions and MIPs were obtained to evaluate the vascular anatomy. CONTRAST:  15mL OMNIPAQUE IOHEXOL 350 MG/ML SOLN COMPARISON:  CTA chest 01/22/2020. FINDINGS: Cardiovascular: Good contrast bolus timing in the pulmonary arterial tree. No focal filling defect identified in the pulmonary arteries to suggest acute pulmonary embolism. Sequelae of CABG. Little contrast in the aorta today. Stable cardiomegaly. No pericardial effusion. Mediastinum/Nodes: No mediastinal lymphadenopathy. Lungs/Pleura: No convincing pleural fluid. Major airways are patent. Improved lung volumes from last month. No evidence of pulmonary edema. Mild lung base scarring or atelectasis. Upper Abdomen: Negative visible liver, spleen, adrenal glands, pancreas and stomach. Minimal visualization of the left renal upper pole only. Musculoskeletal: Prior sternotomy. T1 spina bifida occulta (normal variant). No acute osseous abnormality identified. Review of the MIP images confirms the above findings. IMPRESSION: 1. Negative for acute pulmonary embolus. 2. Cardiomegaly.  Prior CABG. 3. No acute findings in the chest. Improved lung volumes from last month with mild lung base scarring or atelectasis. Electronically Signed   By: Genevie Ann M.D.   On: 02/26/2020 20:51   US RENAL  Result Date: 02/26/2020 CLINICAL DATA:  59 year old female with flank pain. EXAM: RENAL / URINARY TRACT ULTRASOUND COMPLETE COMPARISON:  CTA chest today and 01/22/2020. FINDINGS: Right Kidney: Renal measurements: 11.1 x 5.3 x 6.9 cm = volume: 214 mL. Mild persistent fetal lobulations of the right kidney. Cortical echogenicity within normal limits. There is some prominence of the intrarenal collecting system (image 7), but no overt hydronephrosis. Possible nephrolithiasis at the midpole on image 9. Left Kidney: Renal measurements: 11.8 x 4.7 x 3.6 cm = volume: 106 mL. Similar lobulations to the  right kidney. Cortical echogenicity within normal limits. Suggestion of mild hydronephrosis (image 24 and 25). No definite left renal lesion. Bladder: Diminutive, which probably explains the appearance of mild bladder wall thickening (image 51). Other: None. IMPRESSION: Questionable renal hydronephrosis greater on the left. Possible right nephrolithiasis. If there is hematuria and/or acute renal insufficiency then  follow-up noncontrast CT Abdomen and Pelvis is recommended. Otherwise a routine follow-up CT Urogram (with contrast) would be most valuable. Electronically Signed   By: Genevie Ann M.D.   On: 02/26/2020 20:48   US Venous Img Lower Bilateral (DVT)  Result Date: 02/26/2020 CLINICAL DATA:  Bilateral leg swelling EXAM: BILATERAL LOWER EXTREMITY VENOUS DOPPLER ULTRASOUND TECHNIQUE: Gray-scale sonography with graded compression, as well as color Doppler and duplex ultrasound were performed to evaluate the lower extremity deep venous systems from the level of the common femoral vein and including the common femoral, femoral, profunda femoral, popliteal and calf veins including the posterior tibial, peroneal and gastrocnemius veins when visible. The superficial great saphenous vein was also interrogated. Spectral Doppler was utilized to evaluate flow at rest and with distal augmentation maneuvers in the common femoral, femoral and popliteal veins. COMPARISON:  None. FINDINGS: RIGHT LOWER EXTREMITY Common Femoral Vein: No evidence of thrombus. Normal compressibility, respiratory phasicity and response to augmentation. Saphenofemoral Junction: No evidence of thrombus. Normal compressibility and flow on color Doppler imaging. Profunda Femoral Vein: No evidence of thrombus. Normal compressibility and flow on color Doppler imaging. Femoral Vein: No evidence of thrombus. Normal compressibility, respiratory phasicity and response to augmentation. Popliteal Vein: No evidence of thrombus. Normal compressibility, respiratory  phasicity and response to augmentation. Calf Veins: No evidence of thrombus. Normal compressibility and flow on color Doppler imaging. Superficial Great Saphenous Vein: No evidence of thrombus. Normal compressibility. Venous Reflux:  None. Other Findings:  None. LEFT LOWER EXTREMITY Common Femoral Vein: No evidence of thrombus. Normal compressibility, respiratory phasicity and response to augmentation. Saphenofemoral Junction: No evidence of thrombus. Normal compressibility and flow on color Doppler imaging. Profunda Femoral Vein: No evidence of thrombus. Normal compressibility and flow on color Doppler imaging. Femoral Vein: No evidence of thrombus. Normal compressibility, respiratory phasicity and response to augmentation. Popliteal Vein: No evidence of thrombus. Normal compressibility, respiratory phasicity and response to augmentation. Calf Veins: No evidence of thrombus. Normal compressibility and flow on color Doppler imaging. Superficial Great Saphenous Vein: No evidence of thrombus. Normal compressibility. Venous Reflux:  None. Other Findings:  None. IMPRESSION: No evidence of deep venous thrombosis in either lower extremity. Electronically Signed   By: Constance Holster M.D.   On: 02/26/2020 23:42    EKG: Normal sinus rhythm, old anterior septal MI, nonspecific lateral ST-T wave abnormalities  ASSESSMENT AND PLAN:   1.  Acute on chronic combined diastolic and systolic congestive heart failure.  The patient has known ischemic cardiomyopathy, as well as, hypertensive cardiomyopathy, with estimated LV ejection fraction 55 to 60% x 2 D echocardiogram 11/24/2019, which is not consistent with recent LVEF of 30% by Lexiscan sestamibi study 01/22/2020.  Patient presents with peripheral edema, which improved after initial diuresis. 2.  Coronary artery disease, status post CABG, currently denies chest pain, with nondiagnostic ECG, mildly elevated high-sensitivity troponin of 41 and 43, without delta.  Recent  Lexiscan sestamibi study showed predominant inferoapical scar with peri-infarct ischemia. 3.  Essential hypertension.  Patient presents with elevated blood pressure, improved today, likely contributing to diastolic CHF. 4.  Chronic kidney disease, BUN and creatinine 17 and 1.69, respectively which appears stable  Recommendations  1.  Agree with current therapy 2.  Continue diuresis 3.  Carefully monitor renal status 4.  Review 2D echocardiogram 5.  Consider adding ACE/ARB which can be done as outpatient 6.  Defer further cardiac diagnostics at this time, including cardiac catheterization  Signed: Isaias Cowman MD,PhD, [facility] 02/27/2020, 8:50 AM

## 2020-02-27 NOTE — Evaluation (Signed)
Occupational Therapy Evaluation Patient Details Name: Terry Arroyo MRN: [id number] DOB: [birth year] Today's Date: 02/27/2020    History of Present Illness Terry Arroyo is a 59 y.o. female with medical history significant of DM2, COPD, CAD, depression, OSA, HTN, sciatica, Hx of Stroke, HLD, brain tumor status post resection and left eye blindness   Presented with generalized edema and feeling somewhat lightheaded.  Edema started her lower extremities but progressed to her upper extremities and even her face feels puffy.she has had some associated left-sided chest pain described as a warm feeling in her chest she also have had some associated nausea vomiting some diarrhea. She feels SOB worsening when she ambulates.   Clinical Impression   Patient presents to ED after nonspecific chest pain and generalized edema.  Patient previously MOD I with BADLs and required assistance for community tasks from father.  Patient sitting at EOB upon entry and agreeable to therapy session.  Patient reports no chest pain but general soreness in B LEs due to swelling.  Provided education on goals and role of OT in acute care setting.  Patient demonstrated good carryover with all education from therapist and performed bathing tasks with SBA while seated, SBA/CGA for dressing tasks and CGA for functional transfers.  Provided education on use of energy conservation techniques and compensatory techniques to improve independence and safety.  The patient would benefit from skilled OT to address overall safety and activity tolerance to reduce caregiver burden and improve independence.  Based on today's performance, recommending [facility] OT upon d/c.    Follow Up Recommendations  Home health OT    Equipment Recommendations  None recommended by OT    Recommendations for Other Services       Precautions / Restrictions Precautions Precautions: Fall Restrictions Weight Bearing Restrictions: No      Mobility Bed  Mobility Overal bed mobility: Modified Independent             General bed mobility comments: Extra time and use of bed rails  Transfers Overall transfer level: Needs assistance   Transfers: Sit to/from Stand;Stand Pivot Transfers Sit to Stand: Min guard;From elevated surface Stand pivot transfers: Min guard       General transfer comment: Cues for body mechanics and sequencing    Balance                                           ADL either performed or assessed with clinical judgement   ADL Overall ADL's : Needs assistance/impaired     Grooming: Wash/dry hands;Wash/dry face;Oral care;Applying deodorant;Brushing hair;Set up;Sitting   Upper Body Bathing: Supervision/ safety;Set up;Sitting   Lower Body Bathing: Supervison/ safety;Set up;Sitting/lateral leans   Upper Body Dressing : Set up;Sitting   Lower Body Dressing: Min guard;Sit to/from stand;Cueing for safety   Toilet Transfer: Min Art therapist Details (indicate cue type and reason): 2 trials for sit to stand with CGA on final attempt after education with body mechanics and sequencing Toileting- Clothing Manipulation and Hygiene: Supervision/safety;Sitting/lateral lean         General ADL Comments: Patient demonstrates overall good safety and use of self pacing.  Occasionally requires cues for sequencing.     Vision Baseline Vision/History: Legally blind(Patient is blind in L eye) Patient Visual Report: No change from baseline       Perception     Praxis  Pertinent Vitals/Pain Pain Assessment: No/denies pain(Patient reports no pain, only slight soreness in B LEs d/e swelling.)     Hand Dominance Left   Extremity/Trunk Assessment Upper Extremity Assessment Upper Extremity Assessment: Overall WFL for tasks assessed   Lower Extremity Assessment Lower Extremity Assessment: Defer to PT evaluation   Cervical / Trunk Assessment Cervical / Trunk Assessment:  Normal   Communication Communication Communication: No difficulties   Cognition Arousal/Alertness: Awake/alert Behavior During Therapy: WFL for tasks assessed/performed Overall Cognitive Status: Within Functional Limits for tasks assessed                                 General Comments: A&Ox4   General Comments  Some edema noted in B LEs, but appears to be resolving.    Exercises Total Joint Exercises Ankle Circles/Pumps: 15 reps;Both;Seated Other Exercises Other Exercises: Educated patient on role and goals of OT in acute care setting Other Exercises: Safety education regarding call light, bed controls, etc Other Exercises: Education on sequencing and body mechanics during functional transfers Other Exercises: Pt completed bed bath while sitting with SBA for UB and LB.  SBA for UB dressing while seated and CGA for LB dressing while standing to manage over hips.   Shoulder Instructions      Home Living Family/patient expects to be discharged to:: Private residence Living Arrangements: Parent Available Help at Discharge: Family;Available PRN/intermittently Type of Home: House Home Access: Level entry     Home Layout: One level     Bathroom Shower/Tub: Teacher, early years/pre: Standard     Home Equipment: Bedside commode;Walker - 2 wheels;Cane - single point;Shower seat;Grab bars - tub/shower   Additional Comments: Pt lives with father who is 11 but very high functioning and able to assist as needed.  Performs community tasks including driving. Pt reports she always uses [facility] for functional mobility.      Prior Functioning/Environment Level of Independence: Independent with assistive device(s)        Comments: Patient reports she consistently uses [facility] for functional mobility.  Does not drive.  MOD I with ADLs.  Pt and father both complete homemaking tasks.        OT Problem List: Decreased activity tolerance      OT  Treatment/Interventions: Self-care/ADL training;Therapeutic exercise;Energy conservation;Therapeutic activities;Patient/family education    OT Goals(Current goals can be found in the care plan section) Acute Rehab OT Goals Patient Stated Goal: "get to feeling better" OT Goal Formulation: With patient Time For Goal Achievement: 03/12/20 Potential to Achieve Goals: Good  OT Frequency: Min 1X/week   Barriers to D/C:            Co-evaluation              AM-PAC OT "6 Clicks" Daily Activity     Outcome Measure Help from another person eating meals?: None Help from another person taking care of personal grooming?: None Help from another person toileting, which includes using toliet, bedpan, or urinal?: A Little Help from another person bathing (including washing, rinsing, drying)?: A Little Help from another person to put on and taking off regular upper body clothing?: None Help from another person to put on and taking off regular lower body clothing?: A Little 6 Click Score: 21   End of Session    Activity Tolerance: Patient tolerated treatment well Patient left: in bed;with call bell/phone within reach;with bed alarm set;Other (comment)(Social worker present)  OT Visit Diagnosis: Unsteadiness on feet (R26.81)                Time: 8338-2505 OT Time Calculation (min): 40 min Charges:  OT General Charges $OT Visit: 1 Visit OT Evaluation $OT Eval Low Complexity: 1 Low OT Treatments $Self Care/Home Management : 38-52 mins  Baldomero Lamy, MS, OTR/L 02/27/20, 10:55 AM

## 2020-02-27 NOTE — Progress Notes (Signed)
PROGRESS NOTE    Terry Arroyo  HKV:[id number] DOB: 02-Jan-1961 DOA: 02/26/2020 PCP: Kirk Ruths, MD   Brief Narrative:  LOANNE Arroyo is a 59 y.o. female with medical history significant of DM2, COPD, CAD s/p CABG, depression, OSA, HTN, sciatica, Hx of Stroke, HLD, brain tumor status post resection and left eye blindness,Presented with generalized edema and feeling somewhat lightheaded.  Apparently her torsemide was increased recently but did not make much difference.  Recent admission in February for hypertensive urgency and nonspecific chest pain.  Myoview done during that time with new decreased in EF from normal to less than 30%.  Prior echo done in December with normal EF and diastolic dysfunction.  Found to have elevated blood pressure, evidence of volume overload and hypokalemia.  Blood pressure improved with nitro drip in ED. she was started on IV Lasix.  Repeat echo was ordered.  Subjective: Patient is feeling little better when seen during morning rounds.  No chest pain.  She continued to complain about exertional dyspnea, orthopnea and PND.  She was sleeping pretty propped up for the past 56-month.  She was talking about not seeing cardiology as an outpatient stating that her physician left the practice and she has not been assigned to a new one.  According to another note her PCP left.  Assessment & Plan:   Active Problems:   Nonspecific chest pain   Hypertensive urgency   DM2 (diabetes mellitus, type 2) (HCC)   CKD (chronic kidney disease), stage III   Hyponatremia   Anasarca   Proteinuria   Hypoalbuminemia   Elevated troponin   Acute on chronic systolic CHF (congestive heart failure) (HCC)   Acute on chronic heart failure (HCC)  Acute on chronic combined systolic and diastolic heart failure.  Patient with hypertensive and ischemic cardiomyopathy, new diagnosis of decreased EF on Myoview done in February.  TSH within normal limit.  Clinically improving. Repeat  echogram ordered-pending results. CTA done due to elevated D-dimer and hypoxia which was negative for PE.  Lower extremity venous Doppler was negative for DVT. Cardiology was consulted-appreciate the recommendations. -No need for further ischemic work-up at this time. -Continue IV Lasix -Daily weight. -Daily BMP. -Strict intake and output.  Hypertension.  Hypertensive urgency resolved. Patient was discharged on hydralazine during prior hospitalization, apparently later developed a allergic reaction and was taken off. -Blood pressure remained elevated. -Increase home dose of amlodipine to 10 mg daily. -Should get ACE or ARB once renal function improves.  Chest pain/CAD. patient was having nonspecific chest discomfort most likely secondary to elevated blood pressure.  Currently resolved. Mildly elevated troponin most likely due to demand. No acute changes in EKG. -Continue to monitor -Continue home dose of Imdur.  AKI with CKD stage III.  Creatinine improving with diuresis.  Baseline around 1.3-1.5. -Continue to monitor. -Avoid nephrotoxins  Electrolyte abnormalities.  She had high Po kalemia with potassium of 2.9 and hypomagnesemia with magnesium of 1.6. -Replete electrolytes and monitor.  Type 2 diabetes.  A1c improved to 10.2 from 14.1 in 1 month. -Continue Lantus 50 units. -Continue SSI.  Proteinuria/hypoalbuminemia.  Albumin within normal limit.  Patient has CKD. -She needs outpatient nephrology evaluation.  OSA.  Per patient she is not on CPAP.  Objective: Vitals:   02/27/20 0238 02/27/20 0549 02/27/20 0811 02/27/20 1242  BP:  (!) 156/85 (!) 143/74 (!) 163/85  Pulse:  66 63 64  Resp:  18  20  Temp:  98.7 F (37.1 C) (!) 97.4  F (36.3 C) (!) 97.5 F (36.4 C)  TempSrc:  Oral Oral Oral  SpO2:  98% 100% 100%  Weight: 81.1 kg     Height:        Intake/Output Summary (Last 24 hours) at 02/27/2020 1248 Last data filed at 02/27/2020 0650 Gross per 24 hour  Intake 240  ml  Output 1000 ml  Net -760 ml   Filed Weights   02/26/20 1303 02/27/20 0045 02/27/20 0238  Weight: 84.8 kg 81.1 kg 81.1 kg    Examination:  General exam: Appears calm and comfortable  Respiratory system: Clear to auscultation. Respiratory effort normal. Cardiovascular system: S1 & S2 heard, RRR. No JVD, murmurs, rubs, gallops or clicks. Gastrointestinal system: Soft, nontender, nondistended, bowel sounds positive. Central nervous system: Alert and oriented. No focal neurological deficits.Symmetric 5 x 5 power. Extremities: 1+LE edema, no cyanosis, pulses intact and symmetrical. Skin: No rashes, lesions or ulcers Psychiatry: Judgement and insight appear normal.   DVT prophylaxis: Lovenox Code Status: Full Family Communication: Discussed plan with patient. Disposition Plan: Pending improvement and diuresing.  Continue IV Lasix for now.  She will go back home.  Consultants:   Cardiology  Procedures:  Antimicrobials:   Data Reviewed: I have personally reviewed following labs and imaging studies  CBC: Recent Labs  Lab 02/26/20 1302 02/27/20 0101  WBC 9.4 10.2  HGB 12.1 11.7*  HCT 36.7 35.1*  MCV 85.9 86.7  PLT 369 644   Basic Metabolic Panel: Recent Labs  Lab 02/26/20 1302 02/26/20 1609 02/27/20 0101  NA 140  --  142  K 2.8*  --  2.9*  CL 105  --  109  CO2 22  --  26  GLUCOSE 257*  --  239*  BUN 14  --  17  CREATININE 1.74*  --  1.69*  CALCIUM 8.5*  --  8.1*  MG  --  1.7 1.6*  PHOS  --  4.2 4.4   GFR: Estimated Creatinine Clearance: 39.4 mL/min (A) (by C-G formula based on SCr of 1.69 mg/dL (H)). Liver Function Tests: Recent Labs  Lab 02/26/20 1302 02/27/20 0101  AST 16 13*  ALT 11 10  ALKPHOS 64 70  BILITOT 0.5 0.8  PROT 6.5 6.2*  ALBUMIN 2.4* 2.3*   No results for input(s): LIPASE, AMYLASE in the last 168 hours. No results for input(s): AMMONIA in the last 168 hours. Coagulation Profile: Recent Labs  Lab 02/26/20 1926  INR 0.9    Cardiac Enzymes: No results for input(s): CKTOTAL, CKMB, CKMBINDEX, TROPONINI in the last 168 hours. BNP (last 3 results) No results for input(s): PROBNP in the last 8760 hours. HbA1C: Recent Labs    02/27/20 0101  HGBA1C 10.2*   CBG: Recent Labs  Lab 02/26/20 1956 02/26/20 2342 02/27/20 0050 02/27/20 0550 02/27/20 0810  GLUCAP 192* 212* 220* 145* 109*   Lipid Profile: No results for input(s): CHOL, HDL, LDLCALC, TRIG, CHOLHDL, LDLDIRECT in the last 72 hours. Thyroid Function Tests: Recent Labs    02/27/20 0101  TSH 2.685   Anemia Panel: No results for input(s): VITAMINB12, FOLATE, FERRITIN, TIBC, IRON, RETICCTPCT in the last 72 hours. Sepsis Labs: No results for input(s): PROCALCITON, LATICACIDVEN in the last 168 hours.  Recent Results (from the past 240 hour(s))  SARS CORONAVIRUS 2 (TAT 6-24 HRS) Nasopharyngeal Nasopharyngeal Swab     Status: None   Collection Time: 02/26/20  6:04 PM   Specimen: Nasopharyngeal Swab  Result Value Ref Range Status   SARS Coronavirus 2  NEGATIVE NEGATIVE Final    Comment: (NOTE) SARS-CoV-2 target nucleic acids are NOT DETECTED. The SARS-CoV-2 RNA is generally detectable in upper and lower respiratory specimens during the acute phase of infection. Negative results do not preclude SARS-CoV-2 infection, do not rule out co-infections with other pathogens, and should not be used as the sole basis for treatment or other patient management decisions. Negative results must be combined with clinical observations, patient history, and epidemiological information. The expected result is Negative. Fact Sheet for Patients: SugarRoll.be Fact Sheet for Healthcare Providers: https://www.woods-mathews.com/ This test is not yet approved or cleared by the [place] FDA and  has been authorized for detection and/or diagnosis of SARS-CoV-2 by FDA under an Emergency Use Authorization (EUA). This EUA will  remain  in effect (meaning this test can be used) for the duration of the COVID-19 declaration under Section 56 4(b)(1) of the Act, 21 U.S.C. section 360bbb-3(b)(1), unless the authorization is terminated or revoked sooner. Performed at [facility], [place] [address], [place], [place] [address]      Radiology Studies: DG Chest 2 View  Result Date: 02/26/2020 CLINICAL DATA:  Chest pain. EXAM: CHEST - 2 VIEW COMPARISON:  November 23, 2019 FINDINGS: Stable mild cardiomegaly. The hila and mediastinum are normal. No pneumothorax. No nodules or masses. No focal infiltrates or overt edema. Mild pulmonary venous congestion suggested. IMPRESSION: Possible mild pulmonary venous congestion. No other acute abnormalities. Electronically Signed   By: Dorise Bullion III M.D   On: 02/26/2020 13:58   CT ANGIO CHEST PE W OR WO CONTRAST  Result Date: 02/26/2020 CLINICAL DATA:  59 year old female with increased lower extremity swelling, shortness of breath, flank pain. EXAM: CT ANGIOGRAPHY CHEST WITH CONTRAST TECHNIQUE: Multidetector CT imaging of the chest was performed using the standard protocol during bolus administration of intravenous contrast. Multiplanar CT image reconstructions and MIPs were obtained to evaluate the vascular anatomy. CONTRAST:  11mL OMNIPAQUE IOHEXOL 350 MG/ML SOLN COMPARISON:  CTA chest 01/22/2020. FINDINGS: Cardiovascular: Good contrast bolus timing in the pulmonary arterial tree. No focal filling defect identified in the pulmonary arteries to suggest acute pulmonary embolism. Sequelae of CABG. Little contrast in the aorta today. Stable cardiomegaly. No pericardial effusion. Mediastinum/Nodes: No mediastinal lymphadenopathy. Lungs/Pleura: No convincing pleural fluid. Major airways are patent. Improved lung volumes from last month. No evidence of pulmonary edema. Mild lung base scarring or atelectasis. Upper Abdomen: Negative visible liver, spleen, adrenal glands, pancreas and stomach.  Minimal visualization of the left renal upper pole only. Musculoskeletal: Prior sternotomy. T1 spina bifida occulta (normal variant). No acute osseous abnormality identified. Review of the MIP images confirms the above findings. IMPRESSION: 1. Negative for acute pulmonary embolus. 2. Cardiomegaly.  Prior CABG. 3. No acute findings in the chest. Improved lung volumes from last month with mild lung base scarring or atelectasis. Electronically Signed   By: Genevie Ann M.D.   On: 02/26/2020 20:51   US RENAL  Result Date: 02/26/2020 CLINICAL DATA:  59 year old female with flank pain. EXAM: RENAL / URINARY TRACT ULTRASOUND COMPLETE COMPARISON:  CTA chest today and 01/22/2020. FINDINGS: Right Kidney: Renal measurements: 11.1 x 5.3 x 6.9 cm = volume: 214 mL. Mild persistent fetal lobulations of the right kidney. Cortical echogenicity within normal limits. There is some prominence of the intrarenal collecting system (image 7), but no overt hydronephrosis. Possible nephrolithiasis at the midpole on image 9. Left Kidney: Renal measurements: 11.8 x 4.7 x 3.6 cm = volume: 106 mL. Similar lobulations to the right kidney.  Cortical echogenicity within normal limits. Suggestion of mild hydronephrosis (image 24 and 25). No definite left renal lesion. Bladder: Diminutive, which probably explains the appearance of mild bladder wall thickening (image 51). Other: None. IMPRESSION: Questionable renal hydronephrosis greater on the left. Possible right nephrolithiasis. If there is hematuria and/or acute renal insufficiency then follow-up noncontrast CT Abdomen and Pelvis is recommended. Otherwise a routine follow-up CT Urogram (with contrast) would be most valuable. Electronically Signed   By: Genevie Ann M.D.   On: 02/26/2020 20:48   US Venous Img Lower Bilateral (DVT)  Result Date: 02/26/2020 CLINICAL DATA:  Bilateral leg swelling EXAM: BILATERAL LOWER EXTREMITY VENOUS DOPPLER ULTRASOUND TECHNIQUE: Gray-scale sonography with graded  compression, as well as color Doppler and duplex ultrasound were performed to evaluate the lower extremity deep venous systems from the level of the common femoral vein and including the common femoral, femoral, profunda femoral, popliteal and calf veins including the posterior tibial, peroneal and gastrocnemius veins when visible. The superficial great saphenous vein was also interrogated. Spectral Doppler was utilized to evaluate flow at rest and with distal augmentation maneuvers in the common femoral, femoral and popliteal veins. COMPARISON:  None. FINDINGS: RIGHT LOWER EXTREMITY Common Femoral Vein: No evidence of thrombus. Normal compressibility, respiratory phasicity and response to augmentation. Saphenofemoral Junction: No evidence of thrombus. Normal compressibility and flow on color Doppler imaging. Profunda Femoral Vein: No evidence of thrombus. Normal compressibility and flow on color Doppler imaging. Femoral Vein: No evidence of thrombus. Normal compressibility, respiratory phasicity and response to augmentation. Popliteal Vein: No evidence of thrombus. Normal compressibility, respiratory phasicity and response to augmentation. Calf Veins: No evidence of thrombus. Normal compressibility and flow on color Doppler imaging. Superficial Great Saphenous Vein: No evidence of thrombus. Normal compressibility. Venous Reflux:  None. Other Findings:  None. LEFT LOWER EXTREMITY Common Femoral Vein: No evidence of thrombus. Normal compressibility, respiratory phasicity and response to augmentation. Saphenofemoral Junction: No evidence of thrombus. Normal compressibility and flow on color Doppler imaging. Profunda Femoral Vein: No evidence of thrombus. Normal compressibility and flow on color Doppler imaging. Femoral Vein: No evidence of thrombus. Normal compressibility, respiratory phasicity and response to augmentation. Popliteal Vein: No evidence of thrombus. Normal compressibility, respiratory phasicity and  response to augmentation. Calf Veins: No evidence of thrombus. Normal compressibility and flow on color Doppler imaging. Superficial Great Saphenous Vein: No evidence of thrombus. Normal compressibility. Venous Reflux:  None. Other Findings:  None. IMPRESSION: No evidence of deep venous thrombosis in either lower extremity. Electronically Signed   By: Constance Holster M.D.   On: 02/26/2020 23:42    Scheduled Meds: . aspirin EC  81 mg Oral Daily  . enoxaparin (LOVENOX) injection  40 mg Subcutaneous Q24H  . furosemide  40 mg Intravenous Q12H  . insulin aspart  0-9 Units Subcutaneous Q4H  . insulin glargine  50 Units Subcutaneous QHS  . isosorbide mononitrate  30 mg Oral Daily  . magnesium oxide  400 mg Oral BID  . metoprolol tartrate  25 mg Oral BID  . mirtazapine  30 mg Oral QHS  . mometasone-formoterol  2 puff Inhalation BID  . potassium chloride  40 mEq Oral Q4H  . sodium chloride flush  3 mL Intravenous Once  . sodium chloride flush  3 mL Intravenous Q12H  . topiramate  25 mg Oral Daily  . valACYclovir  500 mg Oral BID   Continuous Infusions: . sodium chloride    . magnesium sulfate bolus IVPB 2 g (02/27/20 1221)  LOS: 0 days   Time spent: 40 minutes  Lorella Nimrod, MD [facility]  If 7PM-7AM, please contact night-coverage [email or web address]  02/27/2020, 12:48 PM   This record has been created using Systems analyst. Errors have been sought and corrected,but may not always be located. Such creation errors do not reflect on the standard of care.

## 2020-02-28 LAB — BASIC METABOLIC PANEL
Anion gap: 9 (ref 5–15)
BUN: 25 mg/dL — ABNORMAL HIGH (ref 6–20)
CO2: 27 mmol/L (ref 22–32)
Calcium: 7.9 mg/dL — ABNORMAL LOW (ref 8.9–10.3)
Chloride: 104 mmol/L (ref 98–111)
Creatinine, Ser: 2.26 mg/dL — ABNORMAL HIGH (ref 0.44–1.00)
GFR calc Af Amer: 27 mL/min — ABNORMAL LOW (ref >60)
GFR calc non Af Amer: 23 mL/min — ABNORMAL LOW (ref >60)
Glucose, Bld: 151 mg/dL — ABNORMAL HIGH (ref 70–99)
Potassium: 2.9 mmol/L — ABNORMAL LOW (ref 3.5–5.1)
Sodium: 140 mmol/L (ref 135–145)

## 2020-02-28 LAB — ANA W/REFLEX IF POSITIVE: Anti Nuclear Antibody (ANA): NEGATIVE

## 2020-02-28 LAB — GLUCOSE, CAPILLARY
Glucose-Capillary: 136 mg/dL — ABNORMAL HIGH (ref 70–99)
Glucose-Capillary: 153 mg/dL — ABNORMAL HIGH (ref 70–99)
Glucose-Capillary: 165 mg/dL — ABNORMAL HIGH (ref 70–99)
Glucose-Capillary: 170 mg/dL — ABNORMAL HIGH (ref 70–99)
Glucose-Capillary: 182 mg/dL — ABNORMAL HIGH (ref 70–99)
Glucose-Capillary: 222 mg/dL — ABNORMAL HIGH (ref 70–99)
Glucose-Capillary: 252 mg/dL — ABNORMAL HIGH (ref 70–99)

## 2020-02-28 LAB — MAGNESIUM: Magnesium: 2 mg/dL (ref 1.7–2.4)

## 2020-02-28 MED ORDER — POLYETHYLENE GLYCOL 3350 17 G PO PACK
17.0000 g | PACK | Freq: Every day | ORAL | Status: DC
  Administered 2020-02-28 – 2020-03-02 (×4): 17 g via ORAL
  Filled 2020-02-28 (×4): qty 1

## 2020-02-28 MED ORDER — ENOXAPARIN SODIUM 30 MG/0.3ML ~~LOC~~ SOLN
30.0000 mg | SUBCUTANEOUS | Status: DC
  Administered 2020-02-28: 30 mg via SUBCUTANEOUS
  Filled 2020-02-28: qty 0.3

## 2020-02-28 MED ORDER — POTASSIUM CHLORIDE CRYS ER 20 MEQ PO TBCR
40.0000 meq | EXTENDED_RELEASE_TABLET | ORAL | Status: AC
  Administered 2020-02-28 (×2): 40 meq via ORAL
  Filled 2020-02-28 (×2): qty 2

## 2020-02-28 NOTE — Progress Notes (Signed)
[facility]    SUBJECTIVE: The patient denies chest pain. She reports that she is feeling somewhat better, but is still not able to comfortably lie flat in bed due to shortness of breath, which she has experienced for the last few months. Her peripheral edema has also improved.    Vitals:   02/27/20 1646 02/27/20 2007 02/28/20 0333 02/28/20 0725  BP: (!) 166/84 (!) 156/76 (!) 158/76 (!) 162/89  Pulse: 67 66 63 66  Resp: 18 18 19 18   Temp: 97.9 F (36.6 C) 98.1 F (36.7 C) 97.8 F (36.6 C) 98 F (36.7 C)  TempSrc: Oral Oral  Oral  SpO2: 100% 97% 96% 98%  Weight:   79.8 kg   Height:         Intake/Output Summary (Last 24 hours) at 02/28/2020 0856 Last data filed at 02/27/2020 2049 Gross per 24 hour  Intake 120 ml  Output 900 ml  Net -780 ml      PHYSICAL EXAM  General: Well developed, well nourished, sitting at an incline in bed, in no acute distress HEENT:  Normocephalic and atramatic Neck:  No JVD.  Lungs: Clear bilaterally to auscultation, no wheezing or crackles Heart: HRRR . Normal S1 and S2 without gallops or murmurs.  Abdomen: nondistended Msk:  Back normal, gait not assessed. Normal strength and tone for age. Extremities: Trace to 1+ bilateral pedal and ankle edema.   Neuro: Alert and oriented X 3. Psych:  Good affect, responds appropriately   LABS: Basic Metabolic Panel: Recent Labs    02/26/20 1302 02/26/20 1609 02/27/20 0101 02/28/20 0301  NA   < >  --  142 140  K   < >  --  2.9* 2.9*  CL   < >  --  109 104  CO2   < >  --  26 27  GLUCOSE   < >  --  239* 151*  BUN   < >  --  17 25*  CREATININE   < >  --  1.69* 2.26*  CALCIUM   < >  --  8.1* 7.9*  MG   < > 1.7 1.6* 2.0  PHOS  --  4.2 4.4  --    < > = values in this interval not displayed.   Liver Function Tests: Recent Labs    02/26/20 1302 02/27/20 0101  AST 16 13*  ALT 11 10  ALKPHOS 64 70  BILITOT 0.5 0.8  PROT 6.5 6.2*  ALBUMIN 2.4* 2.3*   No results for input(s): LIPASE,  AMYLASE in the last 72 hours. CBC: Recent Labs    02/26/20 1302 02/27/20 0101  WBC 9.4 10.2  HGB 12.1 11.7*  HCT 36.7 35.1*  MCV 85.9 86.7  PLT 369 372   Cardiac Enzymes: No results for input(s): CKTOTAL, CKMB, CKMBINDEX, TROPONINI in the last 72 hours. BNP: Invalid input(s): POCBNP D-Dimer: No results for input(s): DDIMER in the last 72 hours. Hemoglobin A1C: Recent Labs    02/27/20 0101  HGBA1C 10.2*   Fasting Lipid Panel: No results for input(s): CHOL, HDL, LDLCALC, TRIG, CHOLHDL, LDLDIRECT in the last 72 hours. Thyroid Function Tests: Recent Labs    02/27/20 0101  TSH 2.685   Anemia Panel: No results for input(s): VITAMINB12, FOLATE, FERRITIN, TIBC, IRON, RETICCTPCT in the last 72 hours.  DG Chest 2 View  Result Date: 02/26/2020 CLINICAL DATA:  Chest pain. EXAM: CHEST - 2 VIEW COMPARISON:  November 23, 2019 FINDINGS: Stable mild cardiomegaly. The hila  and mediastinum are normal. No pneumothorax. No nodules or masses. No focal infiltrates or overt edema. Mild pulmonary venous congestion suggested. IMPRESSION: Possible mild pulmonary venous congestion. No other acute abnormalities. Electronically Signed   By: Dorise Bullion III M.D   On: 02/26/2020 13:58   CT ANGIO CHEST PE W OR WO CONTRAST  Result Date: 02/26/2020 CLINICAL DATA:  59 year old female with increased lower extremity swelling, shortness of breath, flank pain. EXAM: CT ANGIOGRAPHY CHEST WITH CONTRAST TECHNIQUE: Multidetector CT imaging of the chest was performed using the standard protocol during bolus administration of intravenous contrast. Multiplanar CT image reconstructions and MIPs were obtained to evaluate the vascular anatomy. CONTRAST:  61mL OMNIPAQUE IOHEXOL 350 MG/ML SOLN COMPARISON:  CTA chest 01/22/2020. FINDINGS: Cardiovascular: Good contrast bolus timing in the pulmonary arterial tree. No focal filling defect identified in the pulmonary arteries to suggest acute pulmonary embolism. Sequelae of CABG.  Little contrast in the aorta today. Stable cardiomegaly. No pericardial effusion. Mediastinum/Nodes: No mediastinal lymphadenopathy. Lungs/Pleura: No convincing pleural fluid. Major airways are patent. Improved lung volumes from last month. No evidence of pulmonary edema. Mild lung base scarring or atelectasis. Upper Abdomen: Negative visible liver, spleen, adrenal glands, pancreas and stomach. Minimal visualization of the left renal upper pole only. Musculoskeletal: Prior sternotomy. T1 spina bifida occulta (normal variant). No acute osseous abnormality identified. Review of the MIP images confirms the above findings. IMPRESSION: 1. Negative for acute pulmonary embolus. 2. Cardiomegaly.  Prior CABG. 3. No acute findings in the chest. Improved lung volumes from last month with mild lung base scarring or atelectasis. Electronically Signed   By: Genevie Ann M.D.   On: 02/26/2020 20:51   US RENAL  Result Date: 02/26/2020 CLINICAL DATA:  59 year old female with flank pain. EXAM: RENAL / URINARY TRACT ULTRASOUND COMPLETE COMPARISON:  CTA chest today and 01/22/2020. FINDINGS: Right Kidney: Renal measurements: 11.1 x 5.3 x 6.9 cm = volume: 214 mL. Mild persistent fetal lobulations of the right kidney. Cortical echogenicity within normal limits. There is some prominence of the intrarenal collecting system (image 7), but no overt hydronephrosis. Possible nephrolithiasis at the midpole on image 9. Left Kidney: Renal measurements: 11.8 x 4.7 x 3.6 cm = volume: 106 mL. Similar lobulations to the right kidney. Cortical echogenicity within normal limits. Suggestion of mild hydronephrosis (image 24 and 25). No definite left renal lesion. Bladder: Diminutive, which probably explains the appearance of mild bladder wall thickening (image 51). Other: None. IMPRESSION: Questionable renal hydronephrosis greater on the left. Possible right nephrolithiasis. If there is hematuria and/or acute renal insufficiency then follow-up noncontrast  CT Abdomen and Pelvis is recommended. Otherwise a routine follow-up CT Urogram (with contrast) would be most valuable. Electronically Signed   By: Genevie Ann M.D.   On: 02/26/2020 20:48   US Venous Img Lower Bilateral (DVT)  Result Date: 02/26/2020 CLINICAL DATA:  Bilateral leg swelling EXAM: BILATERAL LOWER EXTREMITY VENOUS DOPPLER ULTRASOUND TECHNIQUE: Gray-scale sonography with graded compression, as well as color Doppler and duplex ultrasound were performed to evaluate the lower extremity deep venous systems from the level of the common femoral vein and including the common femoral, femoral, profunda femoral, popliteal and calf veins including the posterior tibial, peroneal and gastrocnemius veins when visible. The superficial great saphenous vein was also interrogated. Spectral Doppler was utilized to evaluate flow at rest and with distal augmentation maneuvers in the common femoral, femoral and popliteal veins. COMPARISON:  None. FINDINGS: RIGHT LOWER EXTREMITY Common Femoral Vein: No evidence of thrombus. Normal compressibility,  respiratory phasicity and response to augmentation. Saphenofemoral Junction: No evidence of thrombus. Normal compressibility and flow on color Doppler imaging. Profunda Femoral Vein: No evidence of thrombus. Normal compressibility and flow on color Doppler imaging. Femoral Vein: No evidence of thrombus. Normal compressibility, respiratory phasicity and response to augmentation. Popliteal Vein: No evidence of thrombus. Normal compressibility, respiratory phasicity and response to augmentation. Calf Veins: No evidence of thrombus. Normal compressibility and flow on color Doppler imaging. Superficial Great Saphenous Vein: No evidence of thrombus. Normal compressibility. Venous Reflux:  None. Other Findings:  None. LEFT LOWER EXTREMITY Common Femoral Vein: No evidence of thrombus. Normal compressibility, respiratory phasicity and response to augmentation. Saphenofemoral Junction: No  evidence of thrombus. Normal compressibility and flow on color Doppler imaging. Profunda Femoral Vein: No evidence of thrombus. Normal compressibility and flow on color Doppler imaging. Femoral Vein: No evidence of thrombus. Normal compressibility, respiratory phasicity and response to augmentation. Popliteal Vein: No evidence of thrombus. Normal compressibility, respiratory phasicity and response to augmentation. Calf Veins: No evidence of thrombus. Normal compressibility and flow on color Doppler imaging. Superficial Great Saphenous Vein: No evidence of thrombus. Normal compressibility. Venous Reflux:  None. Other Findings:  None. IMPRESSION: No evidence of deep venous thrombosis in either lower extremity. Electronically Signed   By: Constance Holster M.D.   On: 02/26/2020 23:42   ECHOCARDIOGRAM COMPLETE  Result Date: 02/27/2020    ECHOCARDIOGRAM REPORT   Patient Name:   LYNNETT LANGLINAIS Date of Exam: 02/27/2020 Medical Rec #:  [id number]       Height:       66.0 in Accession #:    [id number]      Weight:       178.8 lb Date of Birth:  30-Oct-1961        BSA:          1.907 m Patient Age:    59 years        BP:           143/74 mmHg Patient Gender: F               HR:           68 bpm. Exam Location:  [facility] Procedure: 2D Echo, Color Doppler, Cardiac Doppler and Intracardiac            Opacification Agent Indications:     H88.50 CHF-Acute Systolic  History:         Patient has prior history of Echocardiogram examinations.                  Previous Myocardial Infarction, Prior CABG; Risk                  Factors:Hypertension, Diabetes and Dyslipidemia. Pt tested                  positive for COVID-19 02/2019.  Sonographer:     Charmayne Sheer RDCS (AE) Referring Phys:  [place] Diagnosing Phys: Isaias Cowman MD IMPRESSIONS  1. Left ventricular ejection fraction, by estimation, is 30 to 35%. The left ventricle has moderately decreased function. The left ventricle has no regional wall motion  abnormalities. Left ventricular diastolic parameters were normal.  2. Right ventricular systolic function is normal. The right ventricular size is normal.  3. The mitral valve is normal in structure. Mild mitral valve regurgitation. No evidence of mitral stenosis.  4. The aortic valve is normal in structure. Aortic valve regurgitation is not visualized. No aortic stenosis  is present.  5. The inferior vena cava is normal in size with greater than 50% respiratory variability, suggesting right atrial pressure of 3 mmHg. FINDINGS  Left Ventricle: Left ventricular ejection fraction, by estimation, is 30 to 35%. The left ventricle has moderately decreased function. The left ventricle has no regional wall motion abnormalities. Definity contrast agent was given IV to delineate the left ventricular endocardial borders. The left ventricular internal cavity size was normal in size. There is no left ventricular hypertrophy. Left ventricular diastolic parameters were normal. Right Ventricle: The right ventricular size is normal. No increase in right ventricular wall thickness. Right ventricular systolic function is normal. Left Atrium: Left atrial size was normal in size. Right Atrium: Right atrial size was normal in size. Pericardium: There is no evidence of pericardial effusion. Mitral Valve: The mitral valve is normal in structure. Normal mobility of the mitral valve leaflets. Mild mitral valve regurgitation. No evidence of mitral valve stenosis. MV peak gradient, 6.0 mmHg. The mean mitral valve gradient is 2.0 mmHg. Tricuspid Valve: The tricuspid valve is normal in structure. Tricuspid valve regurgitation is mild . No evidence of tricuspid stenosis. Aortic Valve: The aortic valve is normal in structure. Aortic valve regurgitation is not visualized. No aortic stenosis is present. Aortic valve mean gradient measures 4.0 mmHg. Aortic valve peak gradient measures 11.2 mmHg. Aortic valve area, by VTI measures 2.18 cm. Pulmonic  Valve: The pulmonic valve was normal in structure. Pulmonic valve regurgitation is not visualized. No evidence of pulmonic stenosis. Aorta: The aortic root is normal in size and structure. Venous: The inferior vena cava is normal in size with greater than 50% respiratory variability, suggesting right atrial pressure of 3 mmHg. IAS/Shunts: No atrial level shunt detected by color flow Doppler.  LEFT VENTRICLE PLAX 2D LVIDd:         5.83 cm      Diastology LVIDs:         5.00 cm      LV e' lateral:   3.48 cm/s LV PW:         0.79 cm      LV E/e' lateral: 31.3 LV IVS:        0.84 cm      LV e' medial:    3.05 cm/s LVOT diam:     1.80 cm      LV E/e' medial:  35.7 LV SV:         62 LV SV Index:   33 LVOT Area:     2.54 cm  LV Volumes (MOD) LV vol d, MOD A2C: 97.5 ml LV vol d, MOD A4C: 129.0 ml LV vol s, MOD A2C: 51.8 ml LV vol s, MOD A4C: 80.5 ml LV SV MOD A2C:     45.7 ml LV SV MOD A4C:     129.0 ml LV SV MOD BP:      47.9 ml LEFT ATRIUM             Index LA diam:        3.10 cm 1.63 cm/m LA Vol (A2C):   39.4 ml 20.66 ml/m LA Vol (A4C):   54.2 ml 28.42 ml/m LA Biplane Vol: 48.2 ml 25.28 ml/m  AORTIC VALVE                   PULMONIC VALVE AV Area (Vmax):    1.50 cm    PV Vmax:       1.54 m/s AV Area (Vmean):   1.72  cm    PV Vmean:      98.800 cm/s AV Area (VTI):     2.18 cm    PV VTI:        0.302 m AV Vmax:           167.00 cm/s PV Peak grad:  9.5 mmHg AV Vmean:          95.700 cm/s PV Mean grad:  4.0 mmHg AV VTI:            0.285 m AV Peak Grad:      11.2 mmHg AV Mean Grad:      4.0 mmHg LVOT Vmax:         98.30 cm/s LVOT Vmean:        64.700 cm/s LVOT VTI:          0.244 m LVOT/AV VTI ratio: 0.86  AORTA Ao Root diam: 3.10 cm MITRAL VALVE MV Area (PHT): 5.27 cm     SHUNTS MV Peak grad:  6.0 mmHg     Systemic VTI:  0.24 m MV Mean grad:  2.0 mmHg     Systemic Diam: 1.80 cm MV Vmax:       1.22 m/s MV Vmean:      67.8 cm/s MV Decel Time: 144 msec MV E velocity: 109.00 cm/s MV A velocity: 111.00 cm/s MV E/A ratio:   0.98 Isaias Cowman MD Electronically signed by Isaias Cowman MD Signature Date/Time: 02/27/2020/1:49:37 PM    Final      Echo LVEF 30-35% with no regional wall motion abnormalities, mild mitral regurgitation  TELEMETRY: sinus rhythm, rate 65 bpm  ASSESSMENT AND PLAN:  Active Problems:   Nonspecific chest pain   Hypertensive urgency   DM2 (diabetes mellitus, type 2) (HCC)   CKD (chronic kidney disease), stage III   Hyponatremia   Anasarca   Proteinuria   Hypoalbuminemia   Elevated troponin   Acute on chronic systolic CHF (congestive heart failure) (HCC)   Acute on chronic heart failure (HCC)   Leg edema    1. Acute on chronic combined diastolic and systolic congestive heart failure. The patient has known ischemic cardiomyopathy, as well as, hypertensive cardiomyopathy, with estimated LV ejection fraction 55 to 60% based on 2D echocardiogram in 11/24/2019,which is not consistent with recent LVEF of 30% by Lexiscan sestamibi study 01/22/2020 with a medium defect of moderate severity present in the basal inferoseptal, basal inferior, basal inferolateral, mid inferoseptal, mid inferior, mid inferolateral and apical inferior location. Findings consistent with prior myocardial infarction with peri-infarct ischemia. The patient presented this admission for orthopnea, peripheral edema, with improvement after initial diuresis, though complicated by AKI. 2.  Coronary artery disease, status post CABG, currently denies chest pain, with nondiagnostic ECG, mildly elevated high-sensitivity troponin of 41 and 43, without delta.  See above for Lexiscan results. 3.  Essential hypertension.  Patient presented with elevated blood pressure, likely contributing to diastolic CHF. 4.  Chronic kidney disease, BUN and creatinine worse today: 25 and 2.26. IV Lasix currently being held.  Recommendations: 1. Agree with holding IV Lasix at this time due to worsening kidney function. 2. Recommend nephrology  consult 3. Recommend adding ACEi/ARB when renal function stabilizes 4. Defer cardiac catheterization to evaluate reduced LV function during this admission in the absence of chest pain and in the setting of AKI. Would recommend consider it as outpatient.   Clabe Seal, PA-C 02/28/2020 8:56 AM

## 2020-02-28 NOTE — Progress Notes (Signed)
Inpatient Diabetes Program Recommendations  AACE/ADA: New Consensus Statement on Inpatient Glycemic Control (2015)  Target Ranges:  Prepandial:   less than 140 mg/dL      Peak postprandial:   less than 180 mg/dL (1-2 hours)      Critically ill patients:  140 - 180 mg/dL   Lab Results  Component Value Date   GLUCAP 252 (H) 02/28/2020   HGBA1C 10.2 (H) 02/27/2020    Review of Glycemic Control Results for Terry Arroyo, Terry Arroyo (MRN [id number]) as of 02/28/2020 12:39  Ref. Range 02/27/2020 20:38 02/28/2020 00:24 02/28/2020 03:37 02/28/2020 07:27 02/28/2020 12:06  Glucose-Capillary Latest Ref Range: 70 - 99 mg/dL 184 (H) 170 (H) 153 (H) 136 (H) 252 (H)   Diabetes history: DM2 Outpatient Diabetes medications: Novolog 70/30 mix 40 units tid ac meals Current orders for Inpatient glycemic control: Lantus 50 units qd + Novolog sensitive correction q 4 hrs.  Inpatient Diabetes Program Recommendations:   Since Patient is eating, -Novolog 5 units tid meal coverage if eats 50%  Thank you, Nani Gasser. Chalsey Leeth, RN, MSN, CDE  Diabetes Coordinator Inpatient Glycemic Control Team Team Pager [phone] (8am-5pm) 02/28/2020 12:40 PM

## 2020-02-28 NOTE — Plan of Care (Signed)
  Problem: Clinical Measurements: Goal: Ability to maintain clinical measurements within normal limits will improve Outcome: Progressing Goal: Will remain free from infection Outcome: Progressing   

## 2020-02-28 NOTE — Progress Notes (Signed)
PROGRESS NOTE    Terry Arroyo  FXT:[id number] DOB: 24-Jan-1961 DOA: 02/26/2020 PCP: Kirk Ruths, MD   Brief Narrative:  Terry Arroyo is a 59 y.o. female with medical history significant of DM2, COPD, CAD s/p CABG, depression, OSA, HTN, sciatica, Hx of Stroke, HLD, brain tumor status post resection and left eye blindness,Presented with generalized edema and feeling somewhat lightheaded.  Apparently her torsemide was increased recently but did not make much difference.  Recent admission in February for hypertensive urgency and nonspecific chest pain.  Myoview done during that time with new decreased in EF from normal to less than 30%.  Prior echo done in December with normal EF and diastolic dysfunction.  Found to have elevated blood pressure, evidence of volume overload and hypokalemia.  Blood pressure improved with nitro drip in ED. she was started on IV Lasix.  Repeat echo was ordered.  Subjective: Patient was feeling little better when seen today.  Her shortness of breath is improving but still not able to lie flat due to dyspnea.  Lower extremity edema is improving.  We discussed about worsening renal function and holding Lasix for today.  I answered her questions.  Assessment & Plan:   Active Problems:   Nonspecific chest pain   Hypertensive urgency   DM2 (diabetes mellitus, type 2) (HCC)   CKD (chronic kidney disease), stage III   Hyponatremia   Anasarca   Proteinuria   Hypoalbuminemia   Elevated troponin   Acute on chronic systolic CHF (congestive heart failure) (HCC)   Acute on chronic heart failure (HCC)   Leg edema  Acute on chronic combined systolic and diastolic heart failure.  Patient with hypertensive and ischemic cardiomyopathy, new diagnosis of decreased EF on Myoview done in February.  TSH within normal limit.  Clinically improving. Repeat echogram-with EF of 30 to 35%.  IVC with more than 50% collapsibility, most consistent with euvolemia. CTA done due to  elevated D-dimer and hypoxia which was negative for PE.  Lower extremity venous Doppler was negative for DVT. Cardiology was consulted-appreciate the recommendations. -No need for further ischemic work-up at this time, they will arrange as an outpatient. -Hold IV Lasix today due to increase in her creatinine, if renal function started improving tomorrow she can be started on p.o. Lasix and discharge.  She will follow up with cardiology as an outpatient for further management. -Daily weight. -Daily BMP. -Strict intake and output.  Hypertension.  Hypertensive urgency resolved. Patient was discharged on hydralazine during prior hospitalization, apparently later developed a allergic reaction and was taken off. -Blood pressure remained elevated. -Increase home dose of amlodipine to 10 mg daily. -Should get ACE or ARB once renal function improves.  Chest pain/CAD. patient was having nonspecific chest discomfort most likely secondary to elevated blood pressure.  Currently resolved. Mildly elevated troponin most likely due to demand. No acute changes in EKG. -Continue to monitor -Continue home dose of Imdur.  AKI with CKD stage III.  Creatinine worsened today with diuresis.  Baseline around 1.3-1.5.  Increased to 2.26 today from 1.69. -Holding Lasix. -Continue to monitor. -Avoid nephrotoxins  Electrolyte abnormalities.  She had persistent hypokalemia, potassium remain at 2.9.  Magnesium improved with repletion.  Patient was also getting lasix. -Replete electrolytes and monitor.  Type 2 diabetes.  A1c improved to 10.2 from 14.1 in 1 month. CBG within goal. -Continue Lantus 50 units. -Continue SSI.  Proteinuria/hypoalbuminemia.  Pre Albumin within normal limit.  Patient has CKD. -She needs outpatient nephrology evaluation.  OSA.  Per patient she is not on CPAP.  Objective: Vitals:   02/27/20 1646 02/27/20 2007 02/28/20 0333 02/28/20 0725  BP: (!) 166/84 (!) 156/76 (!) 158/76 (!) 162/89   Pulse: 67 66 63 66  Resp: 18 18 19 18   Temp: 97.9 F (36.6 C) 98.1 F (36.7 C) 97.8 F (36.6 C) 98 F (36.7 C)  TempSrc: Oral Oral  Oral  SpO2: 100% 97% 96% 98%  Weight:   79.8 kg   Height:        Intake/Output Summary (Last 24 hours) at 02/28/2020 1132 Last data filed at 02/28/2020 0950 Gross per 24 hour  Intake 360 ml  Output 1300 ml  Net -940 ml   Filed Weights   02/27/20 0045 02/27/20 0238 02/28/20 0333  Weight: 81.1 kg 81.1 kg 79.8 kg    Examination:  General exam: Appears calm and comfortable  Respiratory system: Clear to auscultation. Respiratory effort normal. Cardiovascular system: S1 & S2 heard, RRR. No JVD, murmurs, rubs, gallops or clicks. Gastrointestinal system: Soft, nontender, nondistended, bowel sounds positive. Central nervous system: Alert and oriented. No focal neurological deficits.Symmetric 5 x 5 power. Extremities: 1+LE edema, no cyanosis, pulses intact and symmetrical. Skin: No rashes, lesions or ulcers Psychiatry: Judgement and insight appear normal.   DVT prophylaxis: Lovenox Code Status: Full Family Communication: Discussed plan with patient. Disposition Plan: Pending improvement in her renal function.  Holding diuretics today, if renal function started improving tomorrow she can go home on p.o. Lasix.  Consultants:   Cardiology  Procedures:  Antimicrobials:   Data Reviewed: I have personally reviewed following labs and imaging studies  CBC: Recent Labs  Lab 02/26/20 1302 02/27/20 0101  WBC 9.4 10.2  HGB 12.1 11.7*  HCT 36.7 35.1*  MCV 85.9 86.7  PLT 369 812   Basic Metabolic Panel: Recent Labs  Lab 02/26/20 1302 02/26/20 1609 02/27/20 0101 02/28/20 0301  NA 140  --  142 140  K 2.8*  --  2.9* 2.9*  CL 105  --  109 104  CO2 22  --  26 27  GLUCOSE 257*  --  239* 151*  BUN 14  --  17 25*  CREATININE 1.74*  --  1.69* 2.26*  CALCIUM 8.5*  --  8.1* 7.9*  MG  --  1.7 1.6* 2.0  PHOS  --  4.2 4.4  --    GFR: Estimated  Creatinine Clearance: 29.3 mL/min (A) (by C-G formula based on SCr of 2.26 mg/dL (H)). Liver Function Tests: Recent Labs  Lab 02/26/20 1302 02/27/20 0101  AST 16 13*  ALT 11 10  ALKPHOS 64 70  BILITOT 0.5 0.8  PROT 6.5 6.2*  ALBUMIN 2.4* 2.3*   No results for input(s): LIPASE, AMYLASE in the last 168 hours. No results for input(s): AMMONIA in the last 168 hours. Coagulation Profile: Recent Labs  Lab 02/26/20 1926  INR 0.9   Cardiac Enzymes: No results for input(s): CKTOTAL, CKMB, CKMBINDEX, TROPONINI in the last 168 hours. BNP (last 3 results) No results for input(s): PROBNP in the last 8760 hours. HbA1C: Recent Labs    02/27/20 0101  HGBA1C 10.2*   CBG: Recent Labs  Lab 02/27/20 1645 02/27/20 2038 02/28/20 0024 02/28/20 0337 02/28/20 0727  GLUCAP 180* 184* 170* 153* 136*   Lipid Profile: No results for input(s): CHOL, HDL, LDLCALC, TRIG, CHOLHDL, LDLDIRECT in the last 72 hours. Thyroid Function Tests: Recent Labs    02/27/20 0101  TSH 2.685   Anemia Panel: No  results for input(s): VITAMINB12, FOLATE, FERRITIN, TIBC, IRON, RETICCTPCT in the last 72 hours. Sepsis Labs: No results for input(s): PROCALCITON, LATICACIDVEN in the last 168 hours.  Recent Results (from the past 240 hour(s))  SARS CORONAVIRUS 2 (TAT 6-24 HRS) Nasopharyngeal Nasopharyngeal Swab     Status: None   Collection Time: 02/26/20  6:04 PM   Specimen: Nasopharyngeal Swab  Result Value Ref Range Status   SARS Coronavirus 2 NEGATIVE NEGATIVE Final    Comment: (NOTE) SARS-CoV-2 target nucleic acids are NOT DETECTED. The SARS-CoV-2 RNA is generally detectable in upper and lower respiratory specimens during the acute phase of infection. Negative results do not preclude SARS-CoV-2 infection, do not rule out co-infections with other pathogens, and should not be used as the sole basis for treatment or other patient management decisions. Negative results must be combined with clinical  observations, patient history, and epidemiological information. The expected result is Negative. Fact Sheet for Patients: SugarRoll.be Fact Sheet for Healthcare Providers: https://www.woods-mathews.com/ This test is not yet approved or cleared by the [place] FDA and  has been authorized for detection and/or diagnosis of SARS-CoV-2 by FDA under an Emergency Use Authorization (EUA). This EUA will remain  in effect (meaning this test can be used) for the duration of the COVID-19 declaration under Section 56 4(b)(1) of the Act, 21 U.S.C. section 360bbb-3(b)(1), unless the authorization is terminated or revoked sooner. Performed at [facility], [place] [address] [place], [place] [address]      Radiology Studies: DG Chest 2 View  Result Date: 02/26/2020 CLINICAL DATA:  Chest pain. EXAM: CHEST - 2 VIEW COMPARISON:  November 23, 2019 FINDINGS: Stable mild cardiomegaly. The hila and mediastinum are normal. No pneumothorax. No nodules or masses. No focal infiltrates or overt edema. Mild pulmonary venous congestion suggested. IMPRESSION: Possible mild pulmonary venous congestion. No other acute abnormalities. Electronically Signed   By: Dorise Bullion III M.D   On: 02/26/2020 13:58   CT ANGIO CHEST PE W OR WO CONTRAST  Result Date: 02/26/2020 CLINICAL DATA:  59 year old female with increased lower extremity swelling, shortness of breath, flank pain. EXAM: CT ANGIOGRAPHY CHEST WITH CONTRAST TECHNIQUE: Multidetector CT imaging of the chest was performed using the standard protocol during bolus administration of intravenous contrast. Multiplanar CT image reconstructions and MIPs were obtained to evaluate the vascular anatomy. CONTRAST:  26mL OMNIPAQUE IOHEXOL 350 MG/ML SOLN COMPARISON:  CTA chest 01/22/2020. FINDINGS: Cardiovascular: Good contrast bolus timing in the pulmonary arterial tree. No focal filling defect identified in the pulmonary arteries to  suggest acute pulmonary embolism. Sequelae of CABG. Little contrast in the aorta today. Stable cardiomegaly. No pericardial effusion. Mediastinum/Nodes: No mediastinal lymphadenopathy. Lungs/Pleura: No convincing pleural fluid. Major airways are patent. Improved lung volumes from last month. No evidence of pulmonary edema. Mild lung base scarring or atelectasis. Upper Abdomen: Negative visible liver, spleen, adrenal glands, pancreas and stomach. Minimal visualization of the left renal upper pole only. Musculoskeletal: Prior sternotomy. T1 spina bifida occulta (normal variant). No acute osseous abnormality identified. Review of the MIP images confirms the above findings. IMPRESSION: 1. Negative for acute pulmonary embolus. 2. Cardiomegaly.  Prior CABG. 3. No acute findings in the chest. Improved lung volumes from last month with mild lung base scarring or atelectasis. Electronically Signed   By: Genevie Ann M.D.   On: 02/26/2020 20:51   US RENAL  Result Date: 02/26/2020 CLINICAL DATA:  59 year old female with flank pain. EXAM: RENAL / URINARY TRACT ULTRASOUND COMPLETE COMPARISON:  CTA  chest today and 01/22/2020. FINDINGS: Right Kidney: Renal measurements: 11.1 x 5.3 x 6.9 cm = volume: 214 mL. Mild persistent fetal lobulations of the right kidney. Cortical echogenicity within normal limits. There is some prominence of the intrarenal collecting system (image 7), but no overt hydronephrosis. Possible nephrolithiasis at the midpole on image 9. Left Kidney: Renal measurements: 11.8 x 4.7 x 3.6 cm = volume: 106 mL. Similar lobulations to the right kidney. Cortical echogenicity within normal limits. Suggestion of mild hydronephrosis (image 24 and 25). No definite left renal lesion. Bladder: Diminutive, which probably explains the appearance of mild bladder wall thickening (image 51). Other: None. IMPRESSION: Questionable renal hydronephrosis greater on the left. Possible right nephrolithiasis. If there is hematuria and/or  acute renal insufficiency then follow-up noncontrast CT Abdomen and Pelvis is recommended. Otherwise a routine follow-up CT Urogram (with contrast) would be most valuable. Electronically Signed   By: Genevie Ann M.D.   On: 02/26/2020 20:48   US Venous Img Lower Bilateral (DVT)  Result Date: 02/26/2020 CLINICAL DATA:  Bilateral leg swelling EXAM: BILATERAL LOWER EXTREMITY VENOUS DOPPLER ULTRASOUND TECHNIQUE: Gray-scale sonography with graded compression, as well as color Doppler and duplex ultrasound were performed to evaluate the lower extremity deep venous systems from the level of the common femoral vein and including the common femoral, femoral, profunda femoral, popliteal and calf veins including the posterior tibial, peroneal and gastrocnemius veins when visible. The superficial great saphenous vein was also interrogated. Spectral Doppler was utilized to evaluate flow at rest and with distal augmentation maneuvers in the common femoral, femoral and popliteal veins. COMPARISON:  None. FINDINGS: RIGHT LOWER EXTREMITY Common Femoral Vein: No evidence of thrombus. Normal compressibility, respiratory phasicity and response to augmentation. Saphenofemoral Junction: No evidence of thrombus. Normal compressibility and flow on color Doppler imaging. Profunda Femoral Vein: No evidence of thrombus. Normal compressibility and flow on color Doppler imaging. Femoral Vein: No evidence of thrombus. Normal compressibility, respiratory phasicity and response to augmentation. Popliteal Vein: No evidence of thrombus. Normal compressibility, respiratory phasicity and response to augmentation. Calf Veins: No evidence of thrombus. Normal compressibility and flow on color Doppler imaging. Superficial Great Saphenous Vein: No evidence of thrombus. Normal compressibility. Venous Reflux:  None. Other Findings:  None. LEFT LOWER EXTREMITY Common Femoral Vein: No evidence of thrombus. Normal compressibility, respiratory phasicity and  response to augmentation. Saphenofemoral Junction: No evidence of thrombus. Normal compressibility and flow on color Doppler imaging. Profunda Femoral Vein: No evidence of thrombus. Normal compressibility and flow on color Doppler imaging. Femoral Vein: No evidence of thrombus. Normal compressibility, respiratory phasicity and response to augmentation. Popliteal Vein: No evidence of thrombus. Normal compressibility, respiratory phasicity and response to augmentation. Calf Veins: No evidence of thrombus. Normal compressibility and flow on color Doppler imaging. Superficial Great Saphenous Vein: No evidence of thrombus. Normal compressibility. Venous Reflux:  None. Other Findings:  None. IMPRESSION: No evidence of deep venous thrombosis in either lower extremity. Electronically Signed   By: Constance Holster M.D.   On: 02/26/2020 23:42   ECHOCARDIOGRAM COMPLETE  Result Date: 02/27/2020    ECHOCARDIOGRAM REPORT   Patient Name:   Terry Arroyo Date of Exam: 02/27/2020 Medical Rec #:  [id number]       Height:       66.0 in Accession #:    [id number]      Weight:       178.8 lb Date of Birth:  Jan 26, 1961        BSA:  1.907 m Patient Age:    68 years        BP:           143/74 mmHg Patient Gender: F               HR:           68 bpm. Exam Location:  [facility] Procedure: 2D Echo, Color Doppler, Cardiac Doppler and Intracardiac            Opacification Agent Indications:     W09.81 CHF-Acute Systolic  History:         Patient has prior history of Echocardiogram examinations.                  Previous Myocardial Infarction, Prior CABG; Risk                  Factors:Hypertension, Diabetes and Dyslipidemia. Pt tested                  positive for COVID-19 02/2019.  Sonographer:     Charmayne Sheer RDCS (AE) Referring Phys:  [facility] Diagnosing Phys: Isaias Cowman MD IMPRESSIONS  1. Left ventricular ejection fraction, by estimation, is 30 to 35%. The left ventricle has moderately decreased function. The  left ventricle has no regional wall motion abnormalities. Left ventricular diastolic parameters were normal.  2. Right ventricular systolic function is normal. The right ventricular size is normal.  3. The mitral valve is normal in structure. Mild mitral valve regurgitation. No evidence of mitral stenosis.  4. The aortic valve is normal in structure. Aortic valve regurgitation is not visualized. No aortic stenosis is present.  5. The inferior vena cava is normal in size with greater than 50% respiratory variability, suggesting right atrial pressure of 3 mmHg. FINDINGS  Left Ventricle: Left ventricular ejection fraction, by estimation, is 30 to 35%. The left ventricle has moderately decreased function. The left ventricle has no regional wall motion abnormalities. Definity contrast agent was given IV to delineate the left ventricular endocardial borders. The left ventricular internal cavity size was normal in size. There is no left ventricular hypertrophy. Left ventricular diastolic parameters were normal. Right Ventricle: The right ventricular size is normal. No increase in right ventricular wall thickness. Right ventricular systolic function is normal. Left Atrium: Left atrial size was normal in size. Right Atrium: Right atrial size was normal in size. Pericardium: There is no evidence of pericardial effusion. Mitral Valve: The mitral valve is normal in structure. Normal mobility of the mitral valve leaflets. Mild mitral valve regurgitation. No evidence of mitral valve stenosis. MV peak gradient, 6.0 mmHg. The mean mitral valve gradient is 2.0 mmHg. Tricuspid Valve: The tricuspid valve is normal in structure. Tricuspid valve regurgitation is mild . No evidence of tricuspid stenosis. Aortic Valve: The aortic valve is normal in structure. Aortic valve regurgitation is not visualized. No aortic stenosis is present. Aortic valve mean gradient measures 4.0 mmHg. Aortic valve peak gradient measures 11.2 mmHg. Aortic valve  area, by VTI measures 2.18 cm. Pulmonic Valve: The pulmonic valve was normal in structure. Pulmonic valve regurgitation is not visualized. No evidence of pulmonic stenosis. Aorta: The aortic root is normal in size and structure. Venous: The inferior vena cava is normal in size with greater than 50% respiratory variability, suggesting right atrial pressure of 3 mmHg. IAS/Shunts: No atrial level shunt detected by color flow Doppler.  LEFT VENTRICLE PLAX 2D LVIDd:         5.83  cm      Diastology LVIDs:         5.00 cm      LV e' lateral:   3.48 cm/s LV PW:         0.79 cm      LV E/e' lateral: 31.3 LV IVS:        0.84 cm      LV e' medial:    3.05 cm/s LVOT diam:     1.80 cm      LV E/e' medial:  35.7 LV SV:         62 LV SV Index:   33 LVOT Area:     2.54 cm  LV Volumes (MOD) LV vol d, MOD A2C: 97.5 ml LV vol d, MOD A4C: 129.0 ml LV vol s, MOD A2C: 51.8 ml LV vol s, MOD A4C: 80.5 ml LV SV MOD A2C:     45.7 ml LV SV MOD A4C:     129.0 ml LV SV MOD BP:      47.9 ml LEFT ATRIUM             Index LA diam:        3.10 cm 1.63 cm/m LA Vol (A2C):   39.4 ml 20.66 ml/m LA Vol (A4C):   54.2 ml 28.42 ml/m LA Biplane Vol: 48.2 ml 25.28 ml/m  AORTIC VALVE                   PULMONIC VALVE AV Area (Vmax):    1.50 cm    PV Vmax:       1.54 m/s AV Area (Vmean):   1.72 cm    PV Vmean:      98.800 cm/s AV Area (VTI):     2.18 cm    PV VTI:        0.302 m AV Vmax:           167.00 cm/s PV Peak grad:  9.5 mmHg AV Vmean:          95.700 cm/s PV Mean grad:  4.0 mmHg AV VTI:            0.285 m AV Peak Grad:      11.2 mmHg AV Mean Grad:      4.0 mmHg LVOT Vmax:         98.30 cm/s LVOT Vmean:        64.700 cm/s LVOT VTI:          0.244 m LVOT/AV VTI ratio: 0.86  AORTA Ao Root diam: 3.10 cm MITRAL VALVE MV Area (PHT): 5.27 cm     SHUNTS MV Peak grad:  6.0 mmHg     Systemic VTI:  0.24 m MV Mean grad:  2.0 mmHg     Systemic Diam: 1.80 cm MV Vmax:       1.22 m/s MV Vmean:      67.8 cm/s MV Decel Time: 144 msec MV E velocity: 109.00 cm/s  MV A velocity: 111.00 cm/s MV E/A ratio:  0.98 Isaias Cowman MD Electronically signed by Isaias Cowman MD Signature Date/Time: 02/27/2020/1:49:37 PM    Final     Scheduled Meds: . amLODipine  10 mg Oral Daily  . aspirin EC  81 mg Oral Daily  . enoxaparin (LOVENOX) injection  30 mg Subcutaneous Q24H  . insulin aspart  0-9 Units Subcutaneous Q4H  . insulin glargine  50 Units Subcutaneous QHS  . isosorbide mononitrate  30 mg Oral Daily  .  magnesium oxide  400 mg Oral BID  . metoprolol tartrate  25 mg Oral BID  . mirtazapine  30 mg Oral QHS  . mometasone-formoterol  2 puff Inhalation BID  . polyethylene glycol  17 g Oral Daily  . potassium chloride  40 mEq Oral Q4H  . sodium chloride flush  3 mL Intravenous Once  . sodium chloride flush  3 mL Intravenous Q12H  . topiramate  25 mg Oral Daily  . valACYclovir  500 mg Oral BID   Continuous Infusions: . sodium chloride       LOS: 1 day   Time spent: 40 minutes  Lorella Nimrod, MD [facility]  If 7PM-7AM, please contact night-coverage [email or web address]  02/28/2020, 11:32 AM   This record has been created using Systems analyst. Errors have been sought and corrected,but may not always be located. Such creation errors do not reflect on the standard of care.

## 2020-02-29 LAB — BASIC METABOLIC PANEL
Anion gap: 10 (ref 5–15)
Anion gap: 6 (ref 5–15)
BUN: 30 mg/dL — ABNORMAL HIGH (ref 6–20)
BUN: 30 mg/dL — ABNORMAL HIGH (ref 6–20)
CO2: 20 mmol/L — ABNORMAL LOW (ref 22–32)
CO2: 22 mmol/L (ref 22–32)
Calcium: 8.3 mg/dL — ABNORMAL LOW (ref 8.9–10.3)
Calcium: 8.1 mg/dL — ABNORMAL LOW (ref 8.9–10.3)
Chloride: 107 mmol/L (ref 98–111)
Chloride: 107 mmol/L (ref 98–111)
Creatinine, Ser: 2.38 mg/dL — ABNORMAL HIGH (ref 0.44–1.00)
Creatinine, Ser: 2.11 mg/dL — ABNORMAL HIGH (ref 0.44–1.00)
GFR calc Af Amer: 25 mL/min — ABNORMAL LOW (ref >60)
GFR calc Af Amer: 29 mL/min — ABNORMAL LOW (ref >60)
GFR calc non Af Amer: 22 mL/min — ABNORMAL LOW (ref >60)
GFR calc non Af Amer: 25 mL/min — ABNORMAL LOW (ref >60)
Glucose, Bld: 214 mg/dL — ABNORMAL HIGH (ref 70–99)
Glucose, Bld: 253 mg/dL — ABNORMAL HIGH (ref 70–99)
Potassium: 4.3 mmol/L (ref 3.5–5.1)
Potassium: 4.4 mmol/L (ref 3.5–5.1)
Sodium: 137 mmol/L (ref 135–145)
Sodium: 135 mmol/L (ref 135–145)

## 2020-02-29 LAB — GLUCOSE, CAPILLARY
Glucose-Capillary: 243 mg/dL — ABNORMAL HIGH (ref 70–99)
Glucose-Capillary: 205 mg/dL — ABNORMAL HIGH (ref 70–99)
Glucose-Capillary: 253 mg/dL — ABNORMAL HIGH (ref 70–99)
Glucose-Capillary: 227 mg/dL — ABNORMAL HIGH (ref 70–99)
Glucose-Capillary: 171 mg/dL — ABNORMAL HIGH (ref 70–99)

## 2020-02-29 MED ORDER — INSULIN ASPART 100 UNIT/ML ~~LOC~~ SOLN
0.0000 [IU] | Freq: Three times a day (TID) | SUBCUTANEOUS | Status: DC
  Administered 2020-02-29: 3 [IU] via SUBCUTANEOUS
  Administered 2020-02-29: 5 [IU] via SUBCUTANEOUS
  Administered 2020-03-01: 2 [IU] via SUBCUTANEOUS
  Administered 2020-03-01: 1 [IU] via SUBCUTANEOUS
  Administered 2020-03-01 – 2020-03-02 (×2): 2 [IU] via SUBCUTANEOUS
  Administered 2020-03-02: 5 [IU] via SUBCUTANEOUS
  Filled 2020-02-29 (×7): qty 1

## 2020-02-29 MED ORDER — INSULIN ASPART 100 UNIT/ML ~~LOC~~ SOLN
3.0000 [IU] | Freq: Three times a day (TID) | SUBCUTANEOUS | Status: DC
  Administered 2020-02-29 – 2020-03-02 (×8): 3 [IU] via SUBCUTANEOUS
  Filled 2020-02-29 (×7): qty 1

## 2020-02-29 MED ORDER — SODIUM CHLORIDE 0.9 % IV BOLUS
500.0000 mL | Freq: Once | INTRAVENOUS | Status: AC
  Administered 2020-02-29: 500 mL via INTRAVENOUS

## 2020-02-29 MED ORDER — INSULIN ASPART 100 UNIT/ML ~~LOC~~ SOLN: 0.0000 [IU] | Freq: Every day | SUBCUTANEOUS | Status: DC

## 2020-02-29 MED ORDER — ENOXAPARIN SODIUM 40 MG/0.4ML ~~LOC~~ SOLN
40.0000 mg | SUBCUTANEOUS | Status: DC
  Administered 2020-02-29 – 2020-03-01 (×2): 40 mg via SUBCUTANEOUS
  Filled 2020-02-29 (×2): qty 0.4

## 2020-02-29 MED ORDER — METOPROLOL SUCCINATE ER 50 MG PO TB24
50.0000 mg | ORAL_TABLET | Freq: Two times a day (BID) | ORAL | Status: DC
  Administered 2020-02-29 – 2020-03-02 (×5): 50 mg via ORAL
  Filled 2020-02-29 (×5): qty 1

## 2020-02-29 NOTE — Progress Notes (Signed)
[facility] Cardiology  SUBJECTIVE: Patient laying flat in bed, denies chest pain or shortness of breath, oxygenating well on room air   Vitals:   02/28/20 1948 02/29/20 0158 02/29/20 0434 02/29/20 0734  BP: 137/80  (!) 148/76 (!) 179/91  Pulse: 70  67 68  Resp: 18  19 19   Temp: 98.5 F (36.9 C)  98.4 F (36.9 C) 98.4 F (36.9 C)  TempSrc: Oral  Oral Oral  SpO2: 100%  96% 100%  Weight:  84.1 kg    Height:         Intake/Output Summary (Last 24 hours) at 02/29/2020 0757 Last data filed at 02/29/2020 0436 Gross per 24 hour  Intake 480 ml  Output 2000 ml  Net -1520 ml      PHYSICAL EXAM  General: Well developed, well nourished, in no acute distress HEENT:  Normocephalic and atramatic Neck:  No JVD.  Lungs: Clear bilaterally to auscultation and percussion. Heart: HRRR . Normal S1 and S2 without gallops or murmurs.  Abdomen: Bowel sounds are positive, abdomen soft and non-tender  Msk:  Back normal, normal gait. Normal strength and tone for age. Extremities: No clubbing, cyanosis or edema.   Neuro: Alert and oriented X 3. Psych:  Good affect, responds appropriately   LABS: Basic Metabolic Panel: Recent Labs    02/26/20 1302 02/26/20 1609 02/27/20 0101 02/27/20 0101 02/28/20 0301 02/29/20 0650  NA   < >  --  142   < > 140 137  K   < >  --  2.9*   < > 2.9* 4.3  CL   < >  --  109   < > 104 107  CO2   < >  --  26   < > 27 20*  GLUCOSE   < >  --  239*   < > 151* 214*  BUN   < >  --  17   < > 25* 30*  CREATININE   < >  --  1.69*   < > 2.26* 2.38*  CALCIUM   < >  --  8.1*   < > 7.9* 8.3*  MG   < > 1.7 1.6*  --  2.0  --   PHOS  --  4.2 4.4  --   --   --    < > = values in this interval not displayed.   Liver Function Tests: Recent Labs    02/26/20 1302 02/27/20 0101  AST 16 13*  ALT 11 10  ALKPHOS 64 70  BILITOT 0.5 0.8  PROT 6.5 6.2*  ALBUMIN 2.4* 2.3*   No results for input(s): LIPASE, AMYLASE in the last 72 hours. CBC: Recent Labs    02/26/20 1302  02/27/20 0101  WBC 9.4 10.2  HGB 12.1 11.7*  HCT 36.7 35.1*  MCV 85.9 86.7  PLT 369 372   Cardiac Enzymes: No results for input(s): CKTOTAL, CKMB, CKMBINDEX, TROPONINI in the last 72 hours. BNP: Invalid input(s): POCBNP D-Dimer: No results for input(s): DDIMER in the last 72 hours. Hemoglobin A1C: Recent Labs    02/27/20 0101  HGBA1C 10.2*   Fasting Lipid Panel: No results for input(s): CHOL, HDL, LDLCALC, TRIG, CHOLHDL, LDLDIRECT in the last 72 hours. Thyroid Function Tests: Recent Labs    02/27/20 0101  TSH 2.685   Anemia Panel: No results for input(s): VITAMINB12, FOLATE, FERRITIN, TIBC, IRON, RETICCTPCT in the last 72 hours.  ECHOCARDIOGRAM COMPLETE  Result Date: 02/27/2020    ECHOCARDIOGRAM REPORT  Patient Name:   Terry Arroyo Date of Exam: 02/27/2020 Medical Rec #:  [id number]       Height:       66.0 in Accession #:    [id number]      Weight:       178.8 lb Date of Birth:  1961-08-14        BSA:          1.907 m Patient Age:    59 years        BP:           143/74 mmHg Patient Gender: F               HR:           68 bpm. Exam Location:  [facility] Procedure: 2D Echo, Color Doppler, Cardiac Doppler and Intracardiac            Opacification Agent Indications:     D63.87 CHF-Acute Systolic  History:         Patient has prior history of Echocardiogram examinations.                  Previous Myocardial Infarction, Prior CABG; Risk                  Factors:Hypertension, Diabetes and Dyslipidemia. Pt tested                  positive for COVID-19 02/2019.  Sonographer:     Charmayne Sheer RDCS (AE) Referring Phys:  [place] Diagnosing Phys: Isaias Cowman MD IMPRESSIONS  1. Left ventricular ejection fraction, by estimation, is 30 to 35%. The left ventricle has moderately decreased function. The left ventricle has no regional wall motion abnormalities. Left ventricular diastolic parameters were normal.  2. Right ventricular systolic function is normal. The right ventricular  size is normal.  3. The mitral valve is normal in structure. Mild mitral valve regurgitation. No evidence of mitral stenosis.  4. The aortic valve is normal in structure. Aortic valve regurgitation is not visualized. No aortic stenosis is present.  5. The inferior vena cava is normal in size with greater than 50% respiratory variability, suggesting right atrial pressure of 3 mmHg. FINDINGS  Left Ventricle: Left ventricular ejection fraction, by estimation, is 30 to 35%. The left ventricle has moderately decreased function. The left ventricle has no regional wall motion abnormalities. Definity contrast agent was given IV to delineate the left ventricular endocardial borders. The left ventricular internal cavity size was normal in size. There is no left ventricular hypertrophy. Left ventricular diastolic parameters were normal. Right Ventricle: The right ventricular size is normal. No increase in right ventricular wall thickness. Right ventricular systolic function is normal. Left Atrium: Left atrial size was normal in size. Right Atrium: Right atrial size was normal in size. Pericardium: There is no evidence of pericardial effusion. Mitral Valve: The mitral valve is normal in structure. Normal mobility of the mitral valve leaflets. Mild mitral valve regurgitation. No evidence of mitral valve stenosis. MV peak gradient, 6.0 mmHg. The mean mitral valve gradient is 2.0 mmHg. Tricuspid Valve: The tricuspid valve is normal in structure. Tricuspid valve regurgitation is mild . No evidence of tricuspid stenosis. Aortic Valve: The aortic valve is normal in structure. Aortic valve regurgitation is not visualized. No aortic stenosis is present. Aortic valve mean gradient measures 4.0 mmHg. Aortic valve peak gradient measures 11.2 mmHg. Aortic valve area, by VTI measures 2.18 cm. Pulmonic Valve: The pulmonic valve  was normal in structure. Pulmonic valve regurgitation is not visualized. No evidence of pulmonic stenosis. Aorta:  The aortic root is normal in size and structure. Venous: The inferior vena cava is normal in size with greater than 50% respiratory variability, suggesting right atrial pressure of 3 mmHg. IAS/Shunts: No atrial level shunt detected by color flow Doppler.  LEFT VENTRICLE PLAX 2D LVIDd:         5.83 cm      Diastology LVIDs:         5.00 cm      LV e' lateral:   3.48 cm/s LV PW:         0.79 cm      LV E/e' lateral: 31.3 LV IVS:        0.84 cm      LV e' medial:    3.05 cm/s LVOT diam:     1.80 cm      LV E/e' medial:  35.7 LV SV:         62 LV SV Index:   33 LVOT Area:     2.54 cm  LV Volumes (MOD) LV vol d, MOD A2C: 97.5 ml LV vol d, MOD A4C: 129.0 ml LV vol s, MOD A2C: 51.8 ml LV vol s, MOD A4C: 80.5 ml LV SV MOD A2C:     45.7 ml LV SV MOD A4C:     129.0 ml LV SV MOD BP:      47.9 ml LEFT ATRIUM             Index LA diam:        3.10 cm 1.63 cm/m LA Vol (A2C):   39.4 ml 20.66 ml/m LA Vol (A4C):   54.2 ml 28.42 ml/m LA Biplane Vol: 48.2 ml 25.28 ml/m  AORTIC VALVE                   PULMONIC VALVE AV Area (Vmax):    1.50 cm    PV Vmax:       1.54 m/s AV Area (Vmean):   1.72 cm    PV Vmean:      98.800 cm/s AV Area (VTI):     2.18 cm    PV VTI:        0.302 m AV Vmax:           167.00 cm/s PV Peak grad:  9.5 mmHg AV Vmean:          95.700 cm/s PV Mean grad:  4.0 mmHg AV VTI:            0.285 m AV Peak Grad:      11.2 mmHg AV Mean Grad:      4.0 mmHg LVOT Vmax:         98.30 cm/s LVOT Vmean:        64.700 cm/s LVOT VTI:          0.244 m LVOT/AV VTI ratio: 0.86  AORTA Ao Root diam: 3.10 cm MITRAL VALVE MV Area (PHT): 5.27 cm     SHUNTS MV Peak grad:  6.0 mmHg     Systemic VTI:  0.24 m MV Mean grad:  2.0 mmHg     Systemic Diam: 1.80 cm MV Vmax:       1.22 m/s MV Vmean:      67.8 cm/s MV Decel Time: 144 msec MV E velocity: 109.00 cm/s MV A velocity: 111.00 cm/s MV E/A ratio:  0.98 Isaias Cowman MD Electronically signed by  Isaias Cowman MD Signature Date/Time: 02/27/2020/1:49:37 PM    Final       Echo LVEF 30 to 35%  TELEMETRY: Sinus rhythm at 65 bpm:  ASSESSMENT AND PLAN:  Active Problems:   Nonspecific chest pain   Hypertensive urgency   DM2 (diabetes mellitus, type 2) (HCC)   CKD (chronic kidney disease), stage III   Hyponatremia   Anasarca   Proteinuria   Hypoalbuminemia   Elevated troponin   Acute on chronic systolic CHF (congestive heart failure) (HCC)   Acute on chronic heart failure (HCC)   Leg edema    1.  Acute on chronic systolic congestive heart failure.  She has known ischemic cardiomyopathy with recent [place] sestamibi study/01/2020 which revealed LVEF of 30% with inferior apical scar without significant ischemia.  Patient has undergone diuresis with overall clinical improvement.  Patient reports that she is near baseline in terms of her breathing and peripheral edema. 2.  Coronary artery disease.  Patient is status post CABG while living in [place].  Currently denies chest pain.  High-sensitivity troponin borderline elevated (41 and 43), without delta. 3.  Essential hypertension.  Patient presented with elevated blood pressure likely contributing to CHF and borderline elevated high-sensitivity troponin.  Patient treated with hydralazine which is not well tolerated.  Amlodipine increased to 10 mg daily.  Systolic blood pressure elevated today.  Heart rate 67 bpm. 4.  Chronic kidney disease, stage IV, BUN and creatinine 30 and 2.38, respectively.  Recommendations  1.  Agree with current therapy 2.  Agree with stopping IV furosemide due to worsening kidney function, and patient currently appears euvolemic 3.  Change metoprolol to tartrate to metoprolol succinate 50 mg twice daily for ischemic cardiomyopathy, and better blood pressure control 4.  Defer cardiac catheterization in the absence of chest pain, and in the setting of acute on chronic knee failure.  May consider invasive evaluation as outpatient once renal function is stabilized. 5.   Consider adding ACEI / ARB when renal function when stabilized 6.  Ambulate, possible discharge later today or in a.m. 7.  Follow-up Dr. Nehemiah Massed in 1 week  Sign off for now, please call for any questions   Isaias Cowman, MD, PhD, [facility] 02/29/2020 7:57 AM

## 2020-02-29 NOTE — Progress Notes (Signed)
PROGRESS NOTE    Terry Arroyo  GBT:[id number] DOB: 1961-10-11 DOA: 02/26/2020 PCP: Kirk Ruths, MD   Brief Narrative:  Terry Arroyo is a 59 y.o. female with medical history significant of DM2, COPD, CAD s/p CABG, depression, OSA, HTN, sciatica, Hx of Stroke, HLD, brain tumor status post resection and left eye blindness,Presented with generalized edema and feeling somewhat lightheaded.  Apparently her torsemide was increased recently but did not make much difference.  Recent admission in February for hypertensive urgency and nonspecific chest pain.  Myoview done during that time with new decreased in EF from normal to less than 30%.  Prior echo done in December with normal EF and diastolic dysfunction.  Found to have elevated blood pressure, evidence of volume overload and hypokalemia.  Blood pressure improved with nitro drip in ED. she was started on IV Lasix.  Repeat echo was ordered.  Subjective: Patient was feeling little better when seen today.  Her dyspnea is improving.  She was able to lay flat without any orthopnea now.  Assessment & Plan:   Active Problems:   Nonspecific chest pain   Hypertensive urgency   DM2 (diabetes mellitus, type 2) (HCC)   CKD (chronic kidney disease), stage III   Hyponatremia   Anasarca   Proteinuria   Hypoalbuminemia   Elevated troponin   Acute on chronic systolic CHF (congestive heart failure) (HCC)   Acute on chronic heart failure (HCC)   Leg edema  Acute on chronic combined systolic and diastolic heart failure.  Patient with hypertensive and ischemic cardiomyopathy, new diagnosis of decreased EF on Myoview done in February.  TSH within normal limit.  Clinically improving. Repeat echogram-with EF of 30 to 35%.  IVC with more than 50% collapsibility, most consistent with euvolemia. CTA done due to elevated D-dimer and hypoxia which was negative for PE.  Lower extremity venous Doppler was negative for DVT. Cardiology was  consulted-appreciate the recommendations. -No need for further ischemic work-up at this time, they will arrange as an outpatient.  She will follow up with cardiology as an outpatient for further management. She appears little dry, keep holding less sick and encouraged increase p.o. intake along with IV saline total of 500 cc. -Daily weight. -Daily BMP. -Strict intake and output.  Hypertension.  Hypertensive urgency resolved. Patient was discharged on hydralazine during prior hospitalization, apparently later developed a allergic reaction and was taken off. -Blood pressure remained elevated. -Increase home dose of amlodipine to 10 mg daily. -Should get ACE or ARB once renal function improves.  Chest pain/CAD. patient was having nonspecific chest discomfort most likely secondary to elevated blood pressure.  Currently resolved. Mildly elevated troponin most likely due to demand. No acute changes in EKG. -Continue to monitor -Continue home dose of Imdur.  AKI with CKD stage III.  Creatinine worsened today when with holding Lasix yesterday.  Baseline around 1.3-1.5.  Increased to 2.38 today from 1.69.  Appears euvolemic. -Holding Lasix. -Giving her a small fluid challenge. -Continue to monitor. -Avoid nephrotoxins  Electrolyte abnormalities.  Electrolytes within normal limits today. -Replete electrolytes as needed and monitor.  Type 2 diabetes.  A1c improved to 10.2 from 14.1 in 1 month. CBG within goal. -Continue Lantus 50 units. -Continue SSI.  Proteinuria/hypoalbuminemia.  Pre Albumin within normal limit.  Patient has CKD. -She needs outpatient nephrology evaluation.  OSA.  Per patient she is not on CPAP.  Objective: Vitals:   02/29/20 0158 02/29/20 0434 02/29/20 0734 02/29/20 1207  BP:  (!) 148/76 Marland Kitchen)  179/91 (!) 165/85  Pulse:  67 68 66  Resp:  19 19 20   Temp:  98.4 F (36.9 C) 98.4 F (36.9 C) 98.1 F (36.7 C)  TempSrc:  Oral Oral Oral  SpO2:  96% 100% 100%  Weight:  84.1 kg     Height:        Intake/Output Summary (Last 24 hours) at 02/29/2020 1337 Last data filed at 02/29/2020 1200 Gross per 24 hour  Intake 1400 ml  Output 2900 ml  Net -1500 ml   Filed Weights   02/27/20 0238 02/28/20 0333 02/29/20 0158  Weight: 81.1 kg 79.8 kg 84.1 kg    Examination:  General exam: Appears calm and comfortable  Respiratory system: Clear to auscultation. Respiratory effort normal. Cardiovascular system: S1 & S2 heard, RRR. No JVD, murmurs, rubs, gallops or clicks. Gastrointestinal system: Soft, nontender, nondistended, bowel sounds positive. Central nervous system: Alert and oriented. No focal neurological deficits.Symmetric 5 x 5 power. Extremities: No edema, no cyanosis, pulses intact and symmetrical. Skin: No rashes, lesions or ulcers Psychiatry: Judgement and insight appear normal.   DVT prophylaxis: Lovenox Code Status: Full Family Communication: Discussed plan with patient. Disposition Plan: Pending improvement in her renal function.  Holding diuretics today, if renal function started improving tomorrow she can go home on p.o. Lasix.  Consultants:   Cardiology  Procedures:  Antimicrobials:   Data Reviewed: I have personally reviewed following labs and imaging studies  CBC: Recent Labs  Lab 02/26/20 1302 02/27/20 0101  WBC 9.4 10.2  HGB 12.1 11.7*  HCT 36.7 35.1*  MCV 85.9 86.7  PLT 369 706   Basic Metabolic Panel: Recent Labs  Lab 02/26/20 1302 02/26/20 1609 02/27/20 0101 02/28/20 0301 02/29/20 0650  NA 140  --  142 140 137  K 2.8*  --  2.9* 2.9* 4.3  CL 105  --  109 104 107  CO2 22  --  26 27 20*  GLUCOSE 257*  --  239* 151* 214*  BUN 14  --  17 25* 30*  CREATININE 1.74*  --  1.69* 2.26* 2.38*  CALCIUM 8.5*  --  8.1* 7.9* 8.3*  MG  --  1.7 1.6* 2.0  --   PHOS  --  4.2 4.4  --   --    GFR: Estimated Creatinine Clearance: 28.5 mL/min (A) (by C-G formula based on SCr of 2.38 mg/dL (H)). Liver Function Tests: Recent  Labs  Lab 02/26/20 1302 02/27/20 0101  AST 16 13*  ALT 11 10  ALKPHOS 64 70  BILITOT 0.5 0.8  PROT 6.5 6.2*  ALBUMIN 2.4* 2.3*   No results for input(s): LIPASE, AMYLASE in the last 168 hours. No results for input(s): AMMONIA in the last 168 hours. Coagulation Profile: Recent Labs  Lab 02/26/20 1926  INR 0.9   Cardiac Enzymes: No results for input(s): CKTOTAL, CKMB, CKMBINDEX, TROPONINI in the last 168 hours. BNP (last 3 results) No results for input(s): PROBNP in the last 8760 hours. HbA1C: Recent Labs    02/27/20 0101  HGBA1C 10.2*   CBG: Recent Labs  Lab 02/28/20 2028 02/28/20 2344 02/29/20 0435 02/29/20 0732 02/29/20 1206  GLUCAP 182* 222* 243* 205* 253*   Lipid Profile: No results for input(s): CHOL, HDL, LDLCALC, TRIG, CHOLHDL, LDLDIRECT in the last 72 hours. Thyroid Function Tests: Recent Labs    02/27/20 0101  TSH 2.685   Anemia Panel: No results for input(s): VITAMINB12, FOLATE, FERRITIN, TIBC, IRON, RETICCTPCT in the last 72 hours. Sepsis Labs:  No results for input(s): PROCALCITON, LATICACIDVEN in the last 168 hours.  Recent Results (from the past 240 hour(s))  SARS CORONAVIRUS 2 (TAT 6-24 HRS) Nasopharyngeal Nasopharyngeal Swab     Status: None   Collection Time: 02/26/20  6:04 PM   Specimen: Nasopharyngeal Swab  Result Value Ref Range Status   SARS Coronavirus 2 NEGATIVE NEGATIVE Final    Comment: (NOTE) SARS-CoV-2 target nucleic acids are NOT DETECTED. The SARS-CoV-2 RNA is generally detectable in upper and lower respiratory specimens during the acute phase of infection. Negative results do not preclude SARS-CoV-2 infection, do not rule out co-infections with other pathogens, and should not be used as the sole basis for treatment or other patient management decisions. Negative results must be combined with clinical observations, patient history, and epidemiological information. The expected result is Negative. Fact Sheet for  Patients: SugarRoll.be Fact Sheet for Healthcare Providers: https://www.woods-mathews.com/ This test is not yet approved or cleared by the [place] FDA and  has been authorized for detection and/or diagnosis of SARS-CoV-2 by FDA under an Emergency Use Authorization (EUA). This EUA will remain  in effect (meaning this test can be used) for the duration of the COVID-19 declaration under Section 56 4(b)(1) of the Act, 21 U.S.C. section 360bbb-3(b)(1), unless the authorization is terminated or revoked sooner. Performed at [facility], [place] [address] [place], [place] [address]      Radiology Studies: No results found.  Scheduled Meds: . amLODipine  10 mg Oral Daily  . aspirin EC  81 mg Oral Daily  . enoxaparin (LOVENOX) injection  30 mg Subcutaneous Q24H  . insulin aspart  0-5 Units Subcutaneous QHS  . insulin aspart  0-9 Units Subcutaneous TID WC  . insulin aspart  3 Units Subcutaneous TID WC  . insulin glargine  50 Units Subcutaneous QHS  . isosorbide mononitrate  30 mg Oral Daily  . magnesium oxide  400 mg Oral BID  . metoprolol succinate  50 mg Oral BID  . mirtazapine  30 mg Oral QHS  . mometasone-formoterol  2 puff Inhalation BID  . polyethylene glycol  17 g Oral Daily  . sodium chloride flush  3 mL Intravenous Once  . sodium chloride flush  3 mL Intravenous Q12H  . topiramate  25 mg Oral Daily  . valACYclovir  500 mg Oral BID   Continuous Infusions: . sodium chloride       LOS: 2 days   Time spent: 40 minutes  Lorella Nimrod, MD [facility]  If 7PM-7AM, please contact night-coverage [email or web address]  02/29/2020, 1:37 PM   This record has been created using Systems analyst. Errors have been sought and corrected,but may not always be located. Such creation errors do not reflect on the standard of care.

## 2020-02-29 NOTE — Plan of Care (Signed)
  Problem: Clinical Measurements: Goal: Ability to maintain clinical measurements within normal limits will improve Outcome: Progressing Note: BUN and creatinine elevated; being managed with IV fluids and increased PO intake per MD orders. Goal: Will remain free from infection Outcome: Progressing Goal: Diagnostic test results will improve Outcome: Progressing Goal: Respiratory complications will improve Outcome: Progressing Goal: Cardiovascular complication will be avoided Outcome: Progressing   Problem: Elimination: Goal: Will not experience complications related to urinary retention Outcome: Progressing   Problem: Education: Goal: Ability to demonstrate management of disease process will improve Outcome: Progressing Goal: Ability to verbalize understanding of medication therapies will improve Outcome: Progressing Goal: Individualized Educational Video(s) Outcome: Progressing   Problem: Activity: Goal: Capacity to carry out activities will improve Outcome: Progressing   Problem: Cardiac: Goal: Ability to achieve and maintain adequate cardiopulmonary perfusion will improve Outcome: Progressing

## 2020-02-29 NOTE — Progress Notes (Signed)
Occupational Therapy Treatment Patient Details Name: Terry Arroyo MRN: [id number] DOB: July 22, 1961 Today's Date: 02/29/2020    History of present illness Pt is a 59 y.o. female presenting to hospital 02/26/20 with worsening swelling in extremities and face, exertional SOB, lightheaded, warm feeling in chest, N/V, and some diarrhea.  Pt admitted with acute on chronic combined diastolic and systolic CHF, nonspecific chest pain, hypertensive urgency, CKD, hypokalemia, anasarca, DM, proteinuria, hypoalbuminemia, elevated troponin, and OSA.  PMH includes CHF, L eye blindness, brain tumor, DM, htn, MI, h/o brain surgery.   OT comments  Patient participated well this date.  Performed toileting with SPV for all aspects and no use of AD for functional mobility/transfers.  Patient has IV and required cues for safety as it was plugged in to the wall.  Patient demonstrates improved activity tolerance and strength overall.  Discussed changing follow up recommendation to 'no OT follow up'. Patient agreeable.       Follow Up Recommendations  No OT follow up    Equipment Recommendations  None recommended by OT    Recommendations for Other Services      Precautions / Restrictions Precautions Precautions: Fall Restrictions Weight Bearing Restrictions: No       Mobility Bed Mobility Overal bed mobility: Modified Independent                Transfers Overall transfer level: Needs assistance Equipment used: None Transfers: Sit to/from Stand Sit to Stand: Supervision         General transfer comment: Required cues for safety regarding IV pole    Balance           Standing balance support: No upper extremity supported Standing balance-Leahy Scale: Good Standing balance comment: Able to perform ADLs within BOS safely                           ADL either performed or assessed with clinical judgement   ADL Overall ADL's : Needs assistance/impaired                          Toilet Transfer: Copy Details (indicate cue type and reason): Patient able to walk in to restroom without use of AD/SBA.  REquired assistance for IV pole safety. Toileting- Clothing Manipulation and Hygiene: Supervision/safety;Sit to/from stand Toileting - Clothing Manipulation Details (indicate cue type and reason): Completeing hygiene standing at sink with SPV     Functional mobility during ADLs: Supervision/safety;Cueing for safety General ADL Comments: Patient demonstrates overall good safety and use of self pacing.     Vision Patient Visual Report: No change from baseline     Perception     Praxis      Cognition Arousal/Alertness: Awake/alert Behavior During Therapy: WFL for tasks assessed/performed Overall Cognitive Status: Within Functional Limits for tasks assessed                                 General Comments: A&Ox4        Exercises Other Exercises Other Exercises: Completed toileting with MOD I for bed mobility.  SPV for functional transfers/mobility without use of AD.  Required cues for safe transfer of IV pole. Other Exercises: Safety education regarding call light, bed controls, etc   Shoulder Instructions       General Comments Improved activity tolerance noted    Pertinent Vitals/ Pain  Pain Assessment: No/denies pain  Home Living Family/patient expects to be discharged to:: Private residence Living Arrangements: Parent Available Help at Discharge: Family;Available PRN/intermittently Type of Home: House Home Access: Level entry     Home Layout: One level     Bathroom Shower/Tub: Teacher, early years/pre: Standard     Home Equipment: Bedside commode;Walker - 2 wheels;Cane - single point;Shower seat;Grab bars - tub/shower   Additional Comments: Pt lives with father who is 61 but very high functioning and able to assist as needed.  Performs community tasks including  driving. Pt reports she always uses SPC for functional mobility.      Prior Functioning/Environment Level of Independence: Independent with assistive device(s)        Comments: Ambulatory with SPC.  H/o fall last month when leg "gave out".   Frequency  Min 1X/week        Progress Toward Goals  OT Goals(current goals can now be found in the care plan section)  Progress towards OT goals: Progressing toward goals     Plan Discharge plan needs to be updated;Frequency remains appropriate    Co-evaluation                 AM-PAC OT "6 Clicks" Daily Activity     Outcome Measure   Help from another person eating meals?: None Help from another person taking care of personal grooming?: None Help from another person toileting, which includes using toliet, bedpan, or urinal?: A Little Help from another person bathing (including washing, rinsing, drying)?: A Little Help from another person to put on and taking off regular upper body clothing?: None Help from another person to put on and taking off regular lower body clothing?: None 6 Click Score: 22    End of Session    OT Visit Diagnosis: Unsteadiness on feet (R26.81)   Activity Tolerance Patient tolerated treatment well   Patient Left in bed;with call bell/phone within reach;with nursing/sitter in room   Nurse Communication          Time: 0630-1601 OT Time Calculation (min): 16 min  Charges: OT General Charges $OT Visit: 1 Visit OT Treatments $Self Care/Home Management : 8-22 mins  Baldomero Lamy, MS, OTR/L 02/29/20, 12:24 PM

## 2020-02-29 NOTE — Plan of Care (Signed)
  Problem: Clinical Measurements: Goal: Ability to maintain clinical measurements within normal limits will improve Outcome: Progressing Goal: Will remain free from infection Outcome: Progressing Goal: Diagnostic test results will improve Outcome: Progressing Goal: Respiratory complications will improve Outcome: Progressing Goal: Cardiovascular complication will be avoided Outcome: Progressing   Problem: Education: Goal: Ability to demonstrate management of disease process will improve Outcome: Progressing Goal: Ability to verbalize understanding of medication therapies will improve Outcome: Progressing Goal: Individualized Educational Video(s) Outcome: Progressing   Problem: Cardiac: Goal: Ability to achieve and maintain adequate cardiopulmonary perfusion will improve Outcome: Progressing

## 2020-02-29 NOTE — Care Management Important Message (Signed)
Important Message  Patient Details  Name: [name] MRN: [id number] Date of Birth: [birth year]   Medicare Important Message Given:  Yes     Dannette Barbara 02/29/2020, 12:49 PM

## 2020-03-01 ENCOUNTER — Inpatient Hospital Stay: Payer: Medicare HMO

## 2020-03-01 LAB — GLUCOSE, CAPILLARY
Glucose-Capillary: 169 mg/dL — ABNORMAL HIGH (ref 70–99)
Glucose-Capillary: 184 mg/dL — ABNORMAL HIGH (ref 70–99)
Glucose-Capillary: 140 mg/dL — ABNORMAL HIGH (ref 70–99)
Glucose-Capillary: 170 mg/dL — ABNORMAL HIGH (ref 70–99)

## 2020-03-01 LAB — BASIC METABOLIC PANEL
Anion gap: 9 (ref 5–15)
BUN: 30 mg/dL — ABNORMAL HIGH (ref 6–20)
CO2: 22 mmol/L (ref 22–32)
Calcium: 8.3 mg/dL — ABNORMAL LOW (ref 8.9–10.3)
Chloride: 108 mmol/L (ref 98–111)
Creatinine, Ser: 2.16 mg/dL — ABNORMAL HIGH (ref 0.44–1.00)
GFR calc Af Amer: 29 mL/min — ABNORMAL LOW (ref >60)
GFR calc non Af Amer: 25 mL/min — ABNORMAL LOW (ref >60)
Glucose, Bld: 163 mg/dL — ABNORMAL HIGH (ref 70–99)
Potassium: 4.2 mmol/L (ref 3.5–5.1)
Sodium: 139 mmol/L (ref 135–145)

## 2020-03-01 LAB — PROTEIN / CREATININE RATIO, URINE
Creatinine, Urine: 37 mg/dL
Protein Creatinine Ratio: 11.95 mg/mg{Cre} — ABNORMAL HIGH (ref 0.00–0.15)
Total Protein, Urine: 442 mg/dL

## 2020-03-01 IMAGING — US US EXTREM LOW VENOUS
1 series · 13 of 24 positions shown · non-contrast
Comparison: None.

CLINICAL DATA: 58-year-old with bilateral leg pain and calf
tenderness.



[Series 1: us venous img lower bilat (dvt) · portal-venous · 13 of 31 slices shown]
[im 1/31]
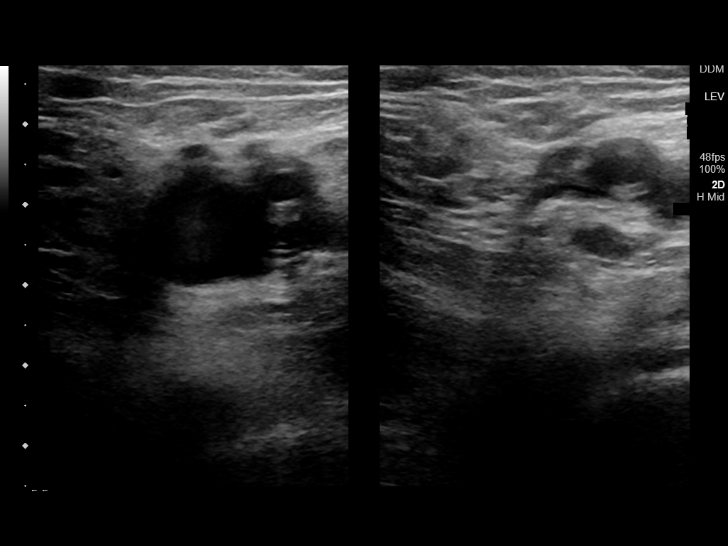
[im 3/31]
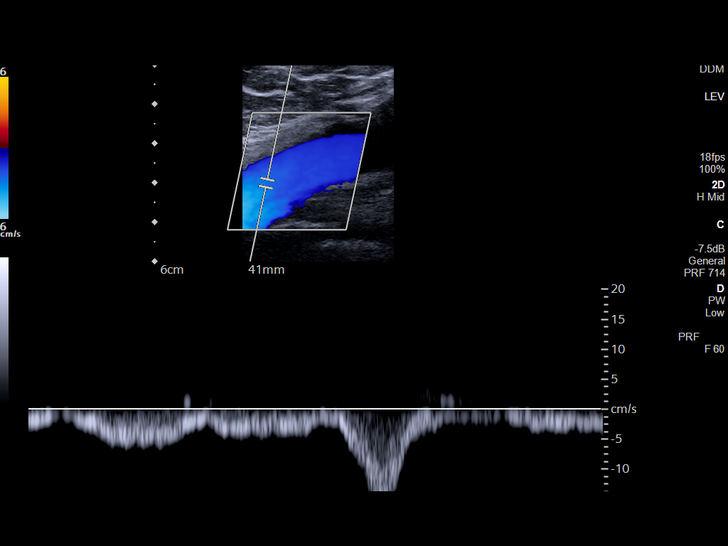
[im 6/31]
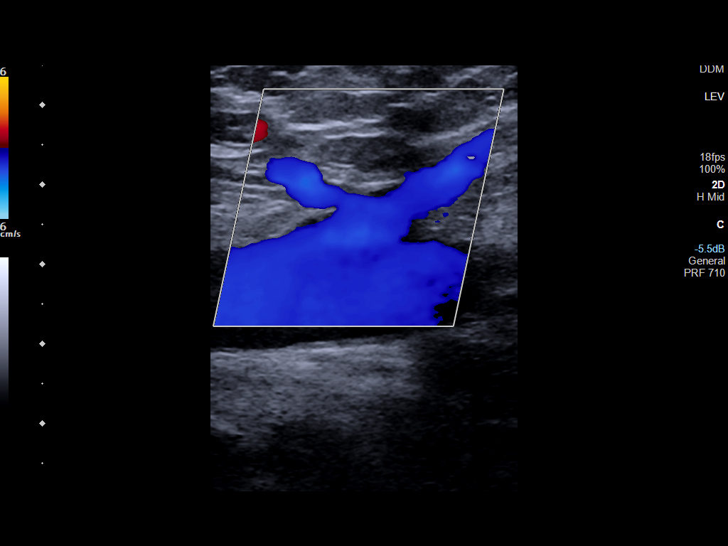
[im 8/31]
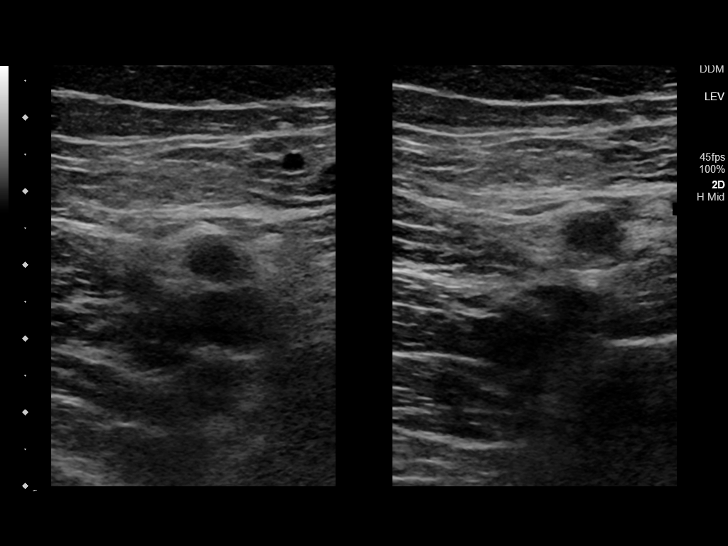
[im 11/31]
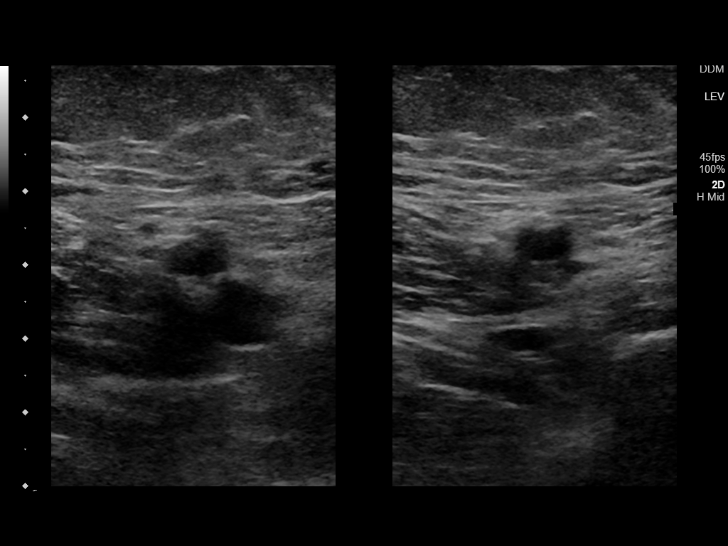
[im 14/31]
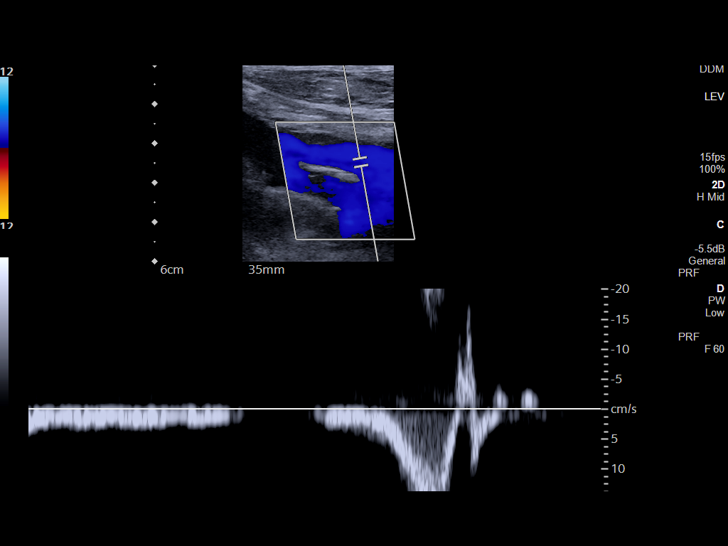
[im 16/31]
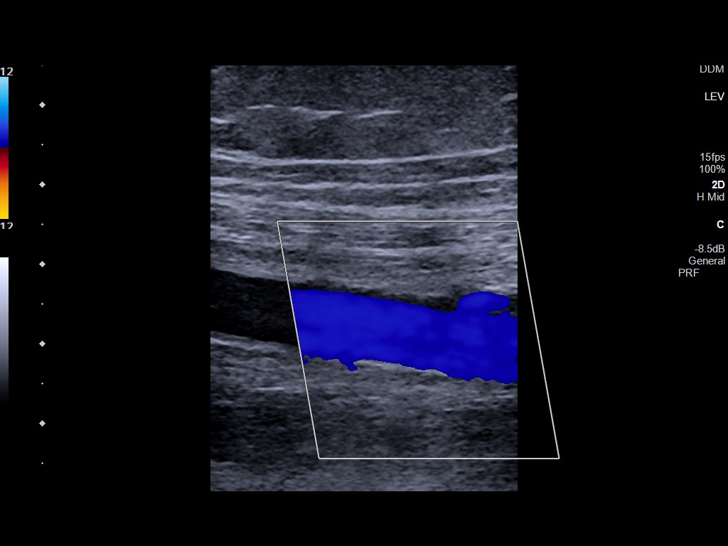
[im 17/31]
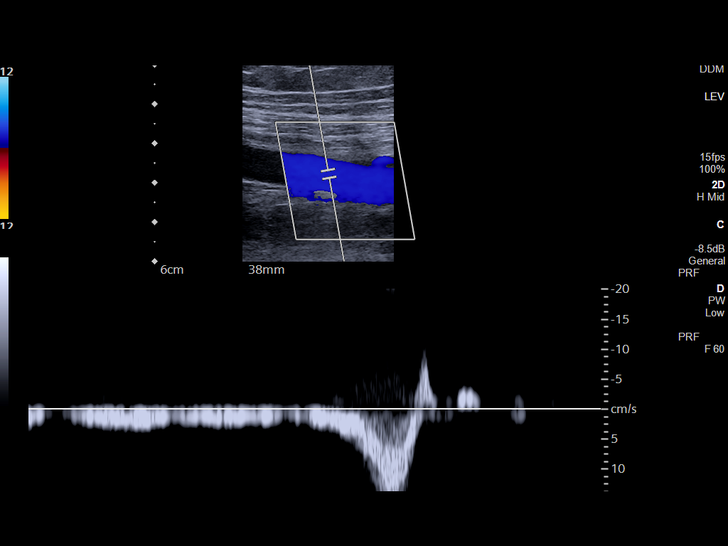
[im 20/31]
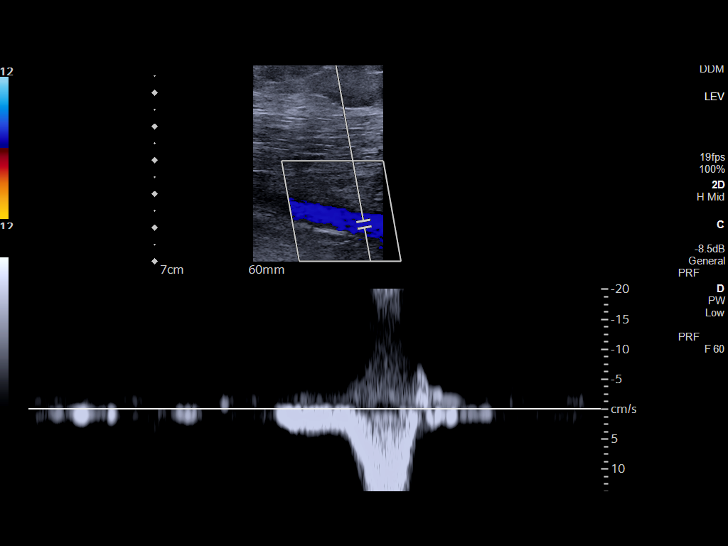
[im 23/31]
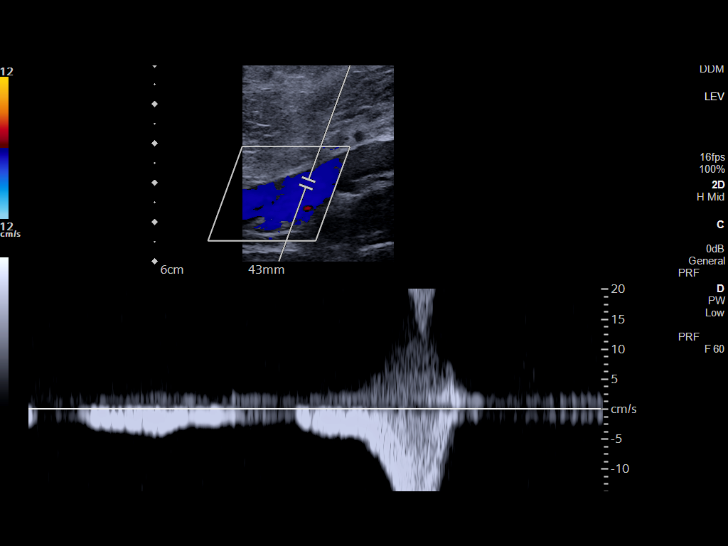
[im 25/31]
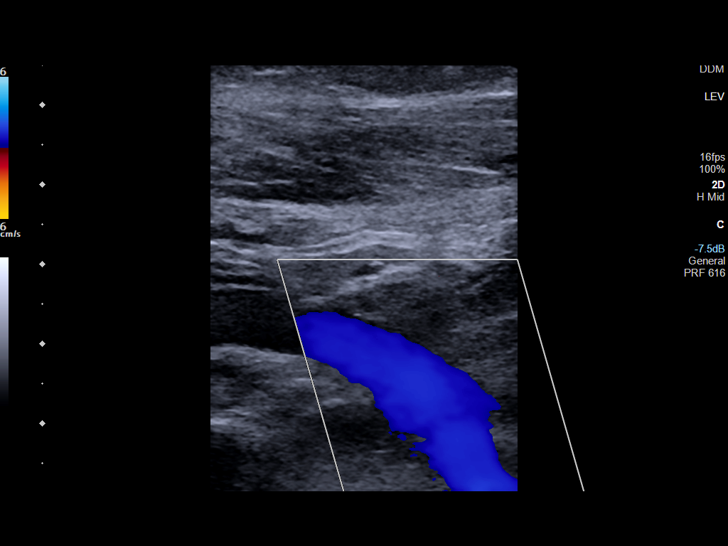
[im 28/31]
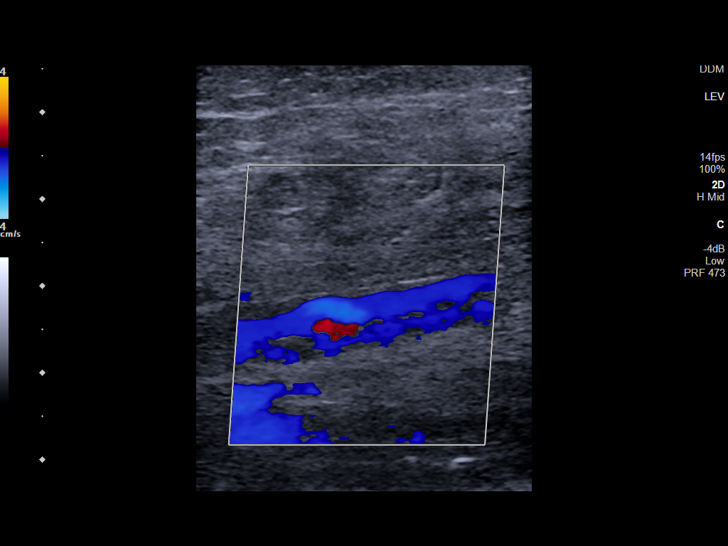
[im 31/31]
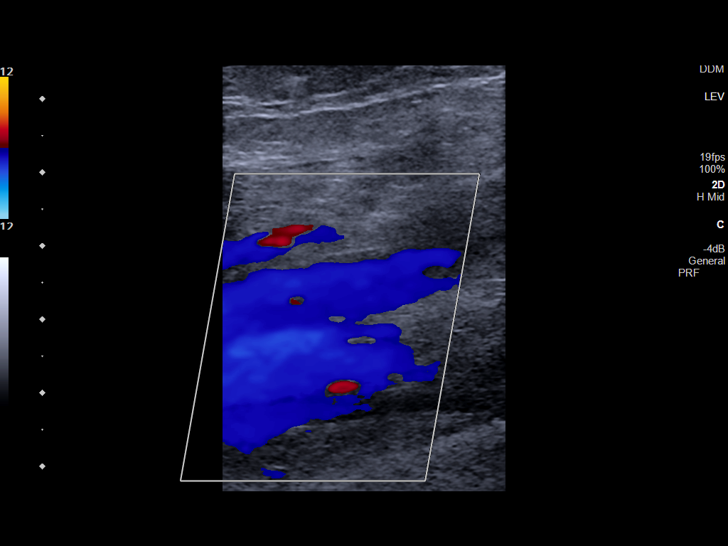

[13 of 24 positions shown; findings below may reference images not displayed]

FINDINGS: RIGHT LOWER EXTREMITY

Common Femoral Vein: No evidence of thrombus. Normal
compressibility, respiratory phasicity and response to augmentation.

Saphenofemoral Junction: No evidence of thrombus. Normal
compressibility and flow on color Doppler imaging.

Profunda Femoral Vein: No evidence of thrombus. Normal
compressibility and flow on color Doppler imaging.

Femoral Vein: No evidence of thrombus. Normal compressibility,
respiratory phasicity and response to augmentation.

Popliteal Vein: No evidence of thrombus. Normal compressibility,
respiratory phasicity and response to augmentation.

Calf Veins: No evidence of thrombus. Normal compressibility and flow
on color Doppler imaging.

LEFT LOWER EXTREMITY

Common Femoral Vein: No evidence of thrombus. Normal
compressibility, respiratory phasicity and response to augmentation.

Saphenofemoral Junction: No evidence of thrombus. Normal
compressibility and flow on color Doppler imaging.

Profunda Femoral Vein: No evidence of thrombus. Normal
compressibility and flow on color Doppler imaging.

Femoral Vein: No evidence of thrombus. Normal compressibility,
respiratory phasicity and response to augmentation.

Popliteal Vein: No evidence of thrombus. Normal compressibility,
respiratory phasicity and response to augmentation.

Calf Veins: No evidence of thrombus. Normal compressibility and flow
on color Doppler imaging.
IMPRESSION: No evidence of deep venous thrombosis in either lower extremity.

## 2020-03-01 IMAGING — US US EXTREM LOW VENOUS
1 series · 13 of 24 positions shown · non-contrast
Comparison: None.

CLINICAL DATA: 58-year-old with bilateral leg pain and calf
tenderness.



[Series 1: us venous img lower bilat (dvt) · portal-venous · 31 acquisitions, 13 frames shown]
[im 1/31]
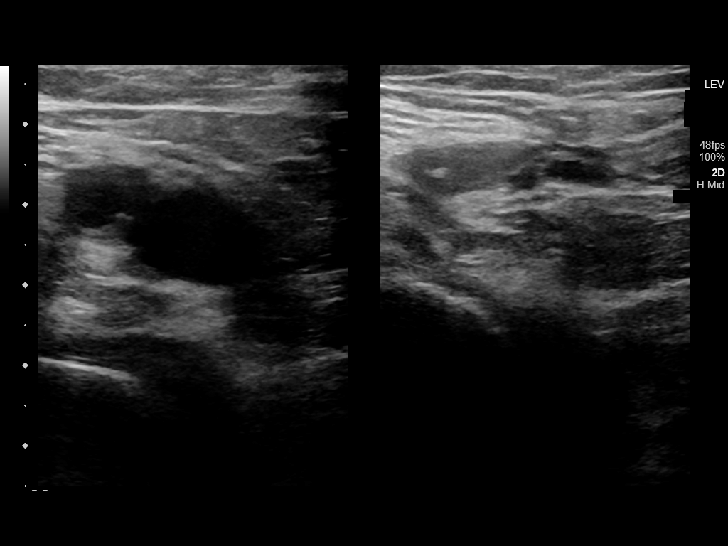
[im 3/31]
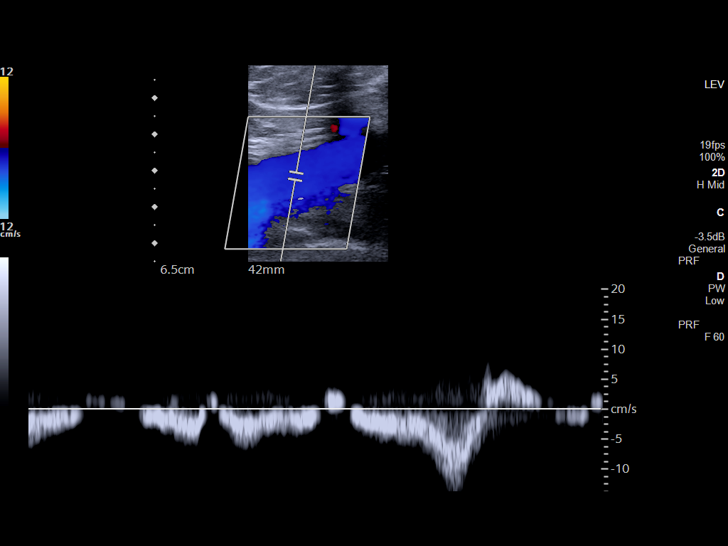
[im 6/31]
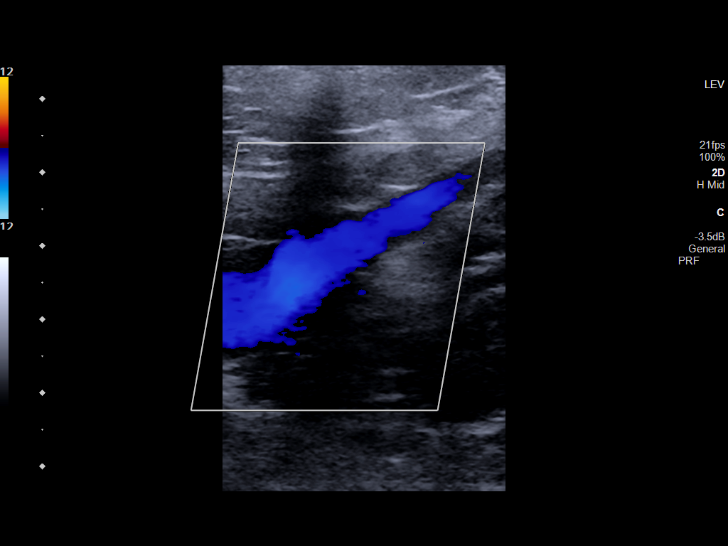
[im 8/31]
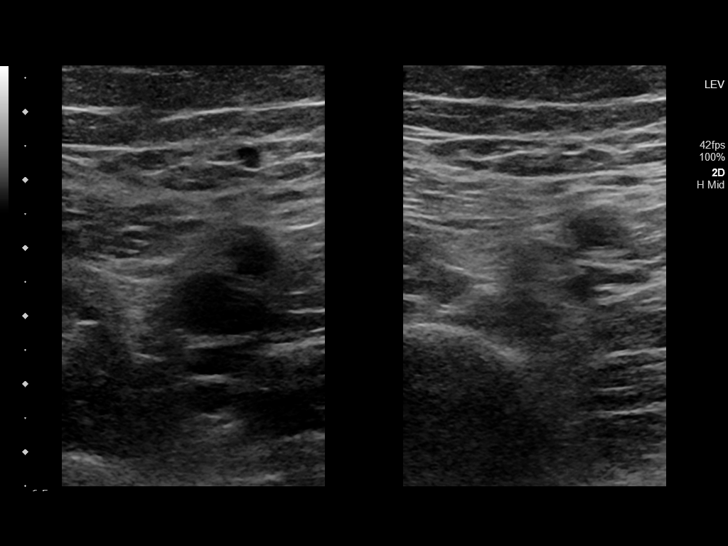
[im 11/31]
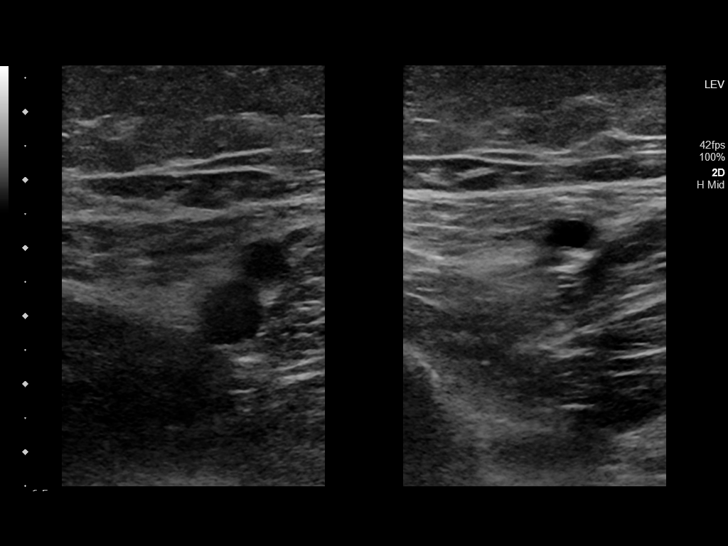
[im 14/31]
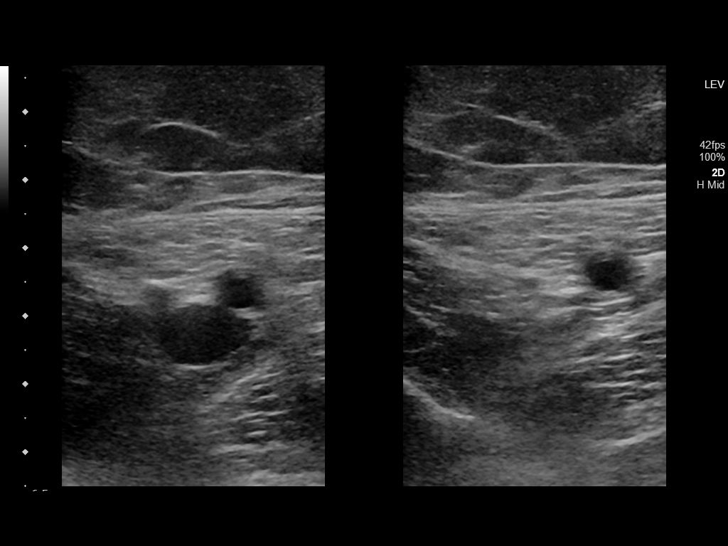
[im 17/31]
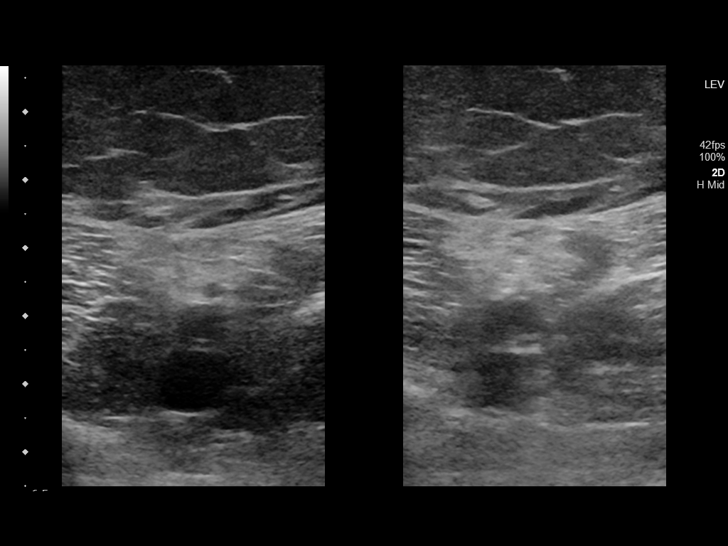
[im 19/31]
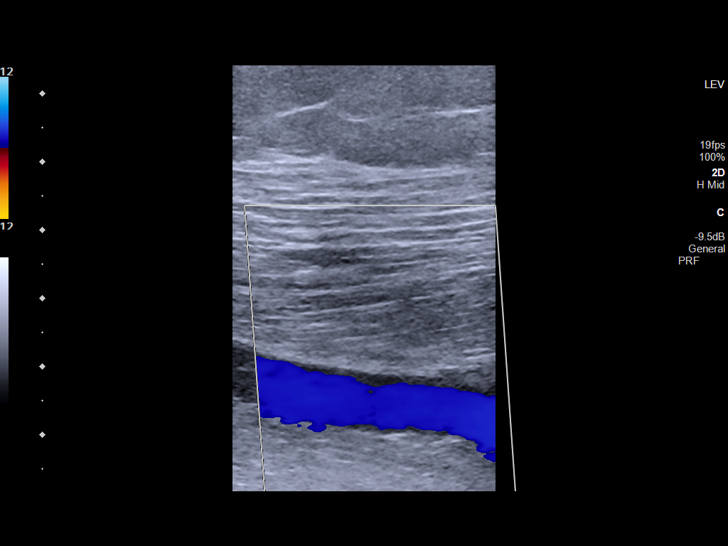
[im 21/31]
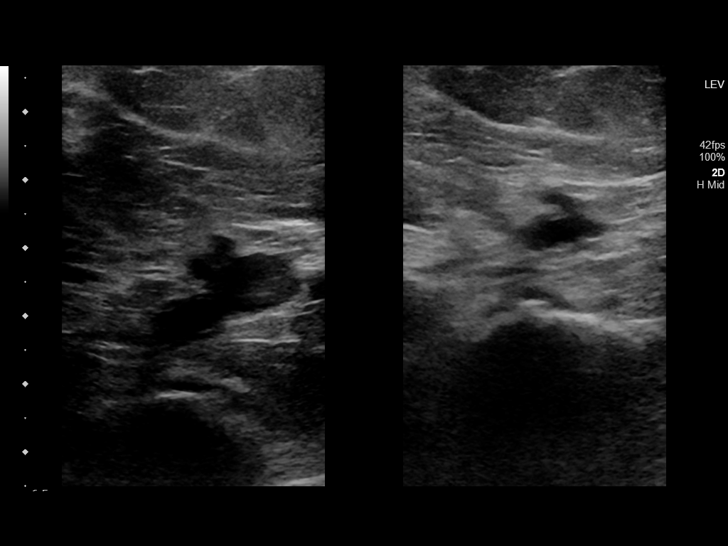
[im 24/31]
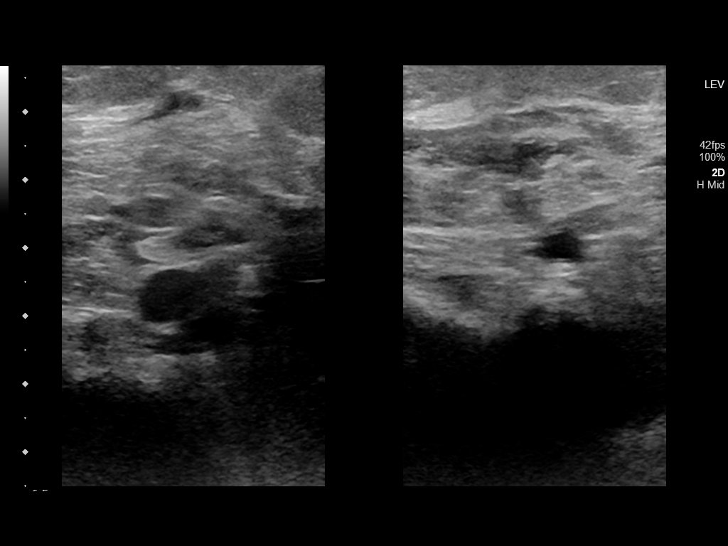
[im 27/31]
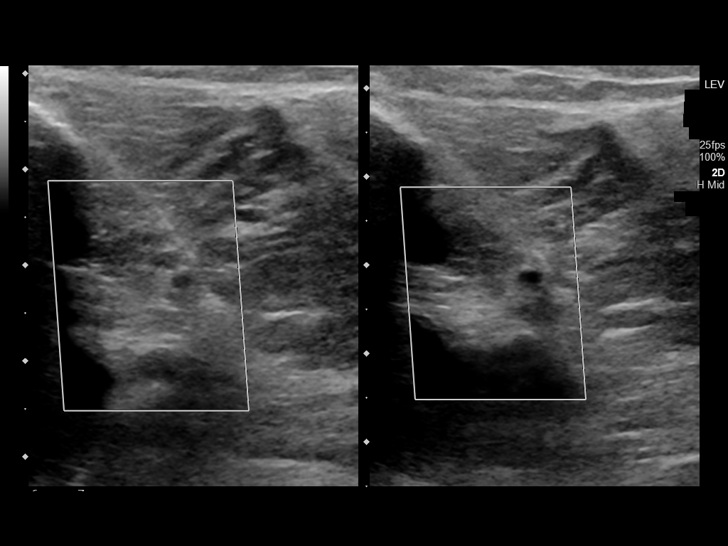
[im 29/31]
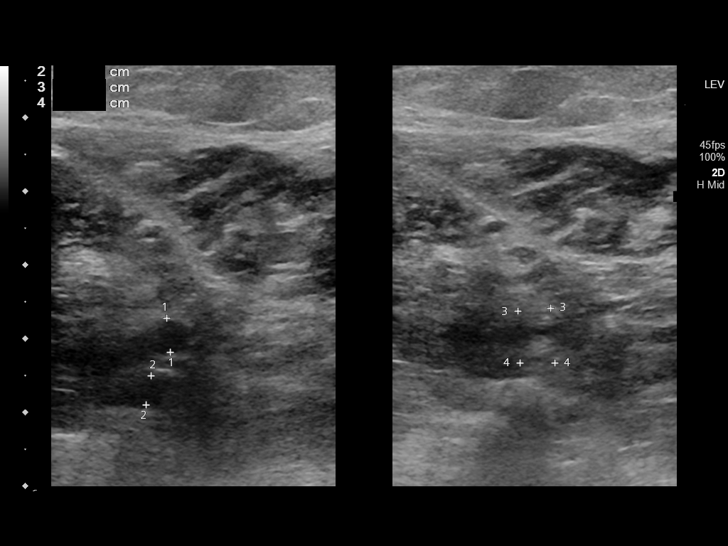
[im 31/31]
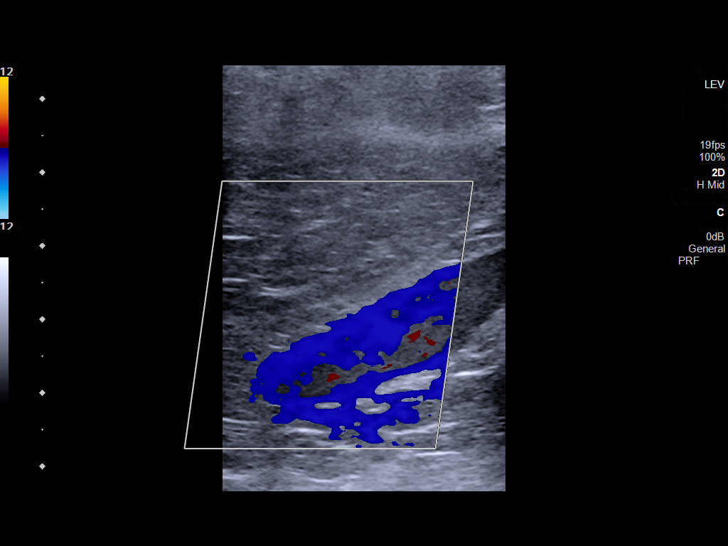

[13 of 24 positions shown; findings below may reference images not displayed]

FINDINGS: RIGHT LOWER EXTREMITY

Common Femoral Vein: No evidence of thrombus. Normal
compressibility, respiratory phasicity and response to augmentation.

Saphenofemoral Junction: No evidence of thrombus. Normal
compressibility and flow on color Doppler imaging.

Profunda Femoral Vein: No evidence of thrombus. Normal
compressibility and flow on color Doppler imaging.

Femoral Vein: No evidence of thrombus. Normal compressibility,
respiratory phasicity and response to augmentation.

Popliteal Vein: No evidence of thrombus. Normal compressibility,
respiratory phasicity and response to augmentation.

Calf Veins: No evidence of thrombus. Normal compressibility and flow
on color Doppler imaging.

LEFT LOWER EXTREMITY

Common Femoral Vein: No evidence of thrombus. Normal
compressibility, respiratory phasicity and response to augmentation.

Saphenofemoral Junction: No evidence of thrombus. Normal
compressibility and flow on color Doppler imaging.

Profunda Femoral Vein: No evidence of thrombus. Normal
compressibility and flow on color Doppler imaging.

Femoral Vein: No evidence of thrombus. Normal compressibility,
respiratory phasicity and response to augmentation.

Popliteal Vein: No evidence of thrombus. Normal compressibility,
respiratory phasicity and response to augmentation.

Calf Veins: No evidence of thrombus. Normal compressibility and flow
on color Doppler imaging.
IMPRESSION: No evidence of deep venous thrombosis in either lower extremity.

## 2020-03-01 IMAGING — CT CT RENAL STONE PROTOCOL
2 of 4 series · 16 of 46 positions shown, 18 images · non-contrast
Comparison: Renal ultrasound, [DATE]

CLINICAL DATA: Left-sided abdominal/pelvic pain beginning this
morning.

EXAM:
CT ABDOMEN AND PELVIS WITHOUT CONTRAST
TECHNIQUE: Multidetector CT imaging of the abdomen and pelvis was performed
following the standard protocol without IV contrast.

[Series 2: stone full standard · axial · 0.79mm/px · z∈[-401,-1]mm · 13 of 88 slices shown, 15 images]
[im 4/88  soft-tissue]
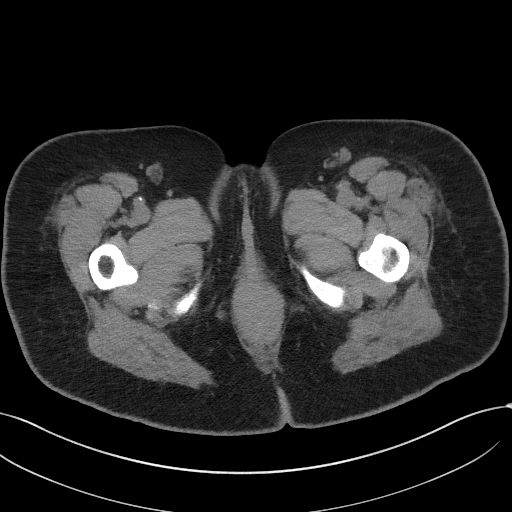
[im 4/88  bone]
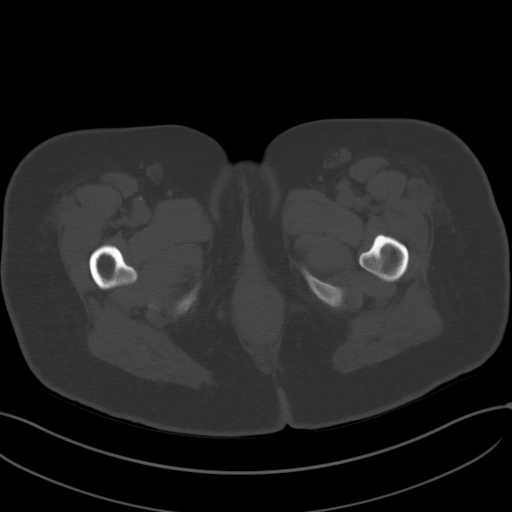
[im 11/88  soft-tissue]
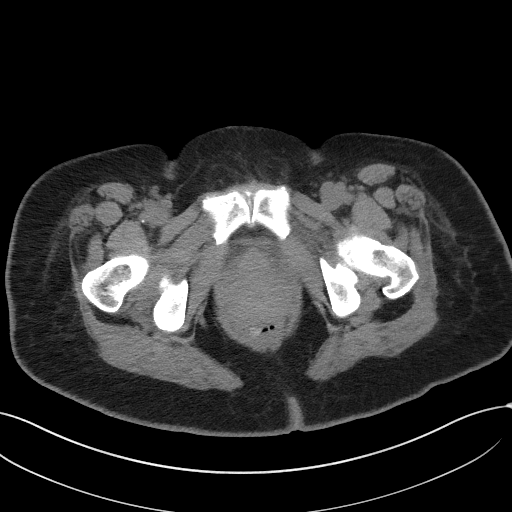
[im 19/88  soft-tissue]
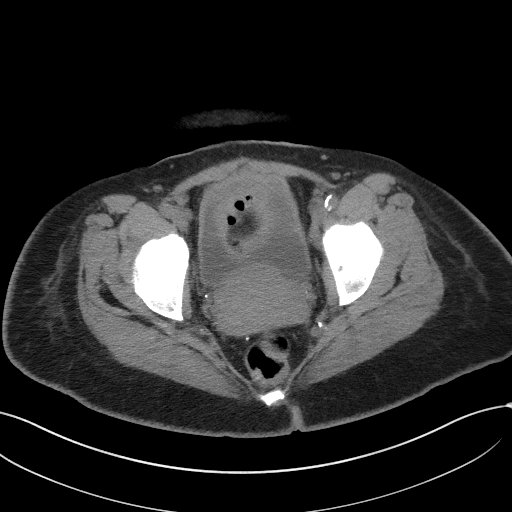
[im 26/88  soft-tissue]
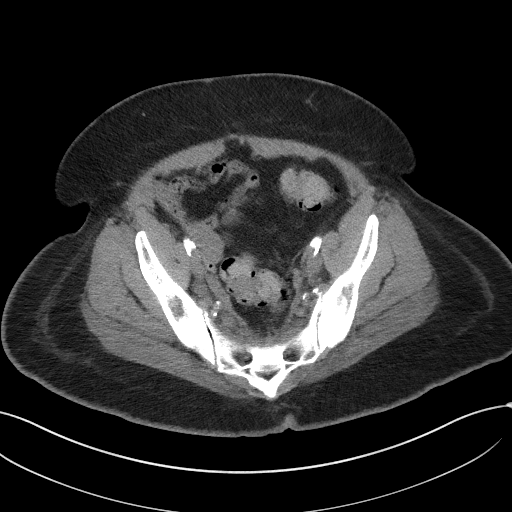
[im 30/88  soft-tissue]
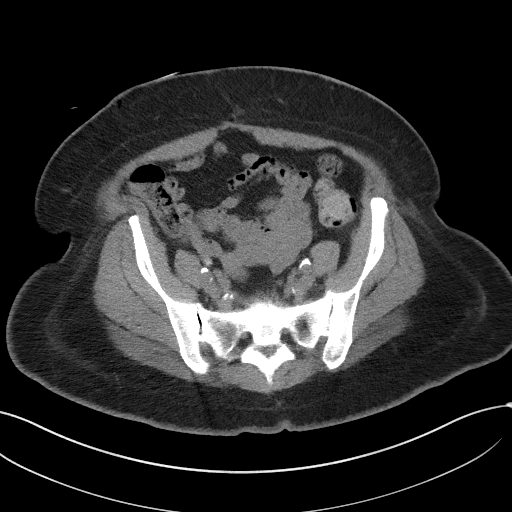
[im 37/88  soft-tissue]
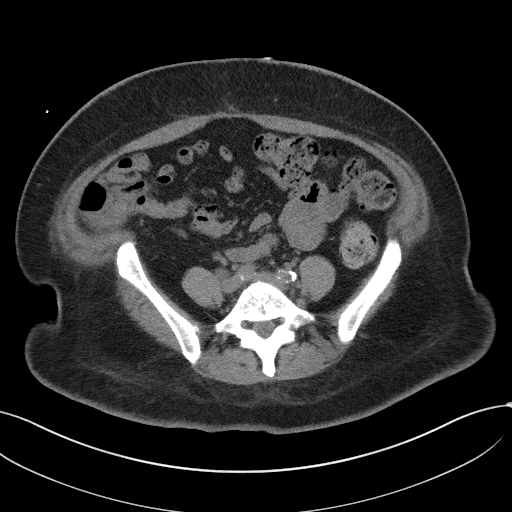
[im 44/88  soft-tissue]
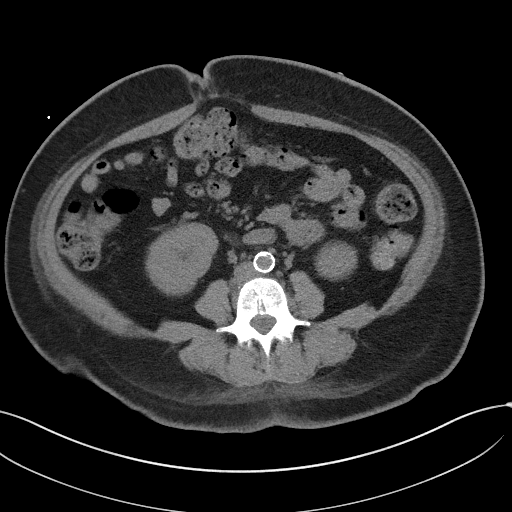
[im 51/88  soft-tissue]
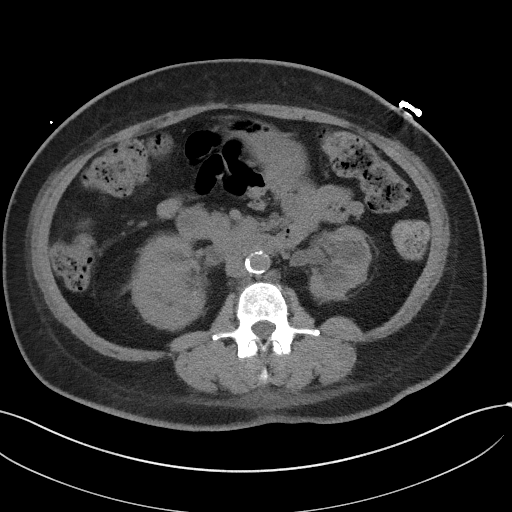
[im 59/88  soft-tissue]
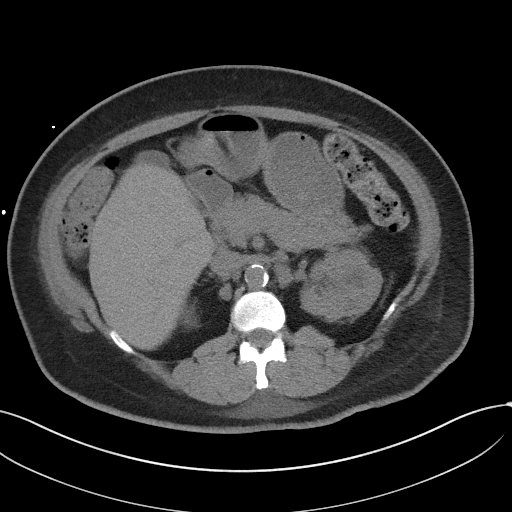
[im 59/88  bone]
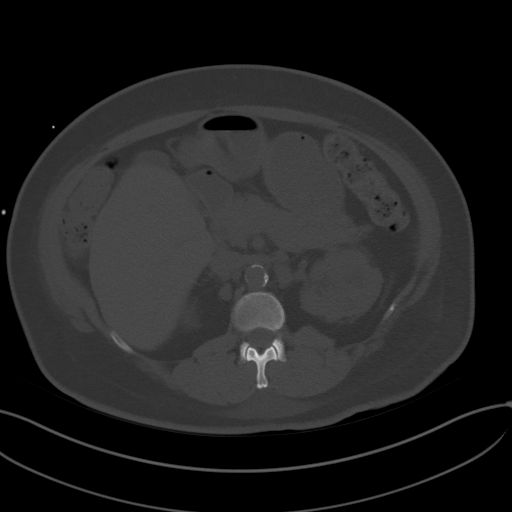
[im 62/88  soft-tissue]
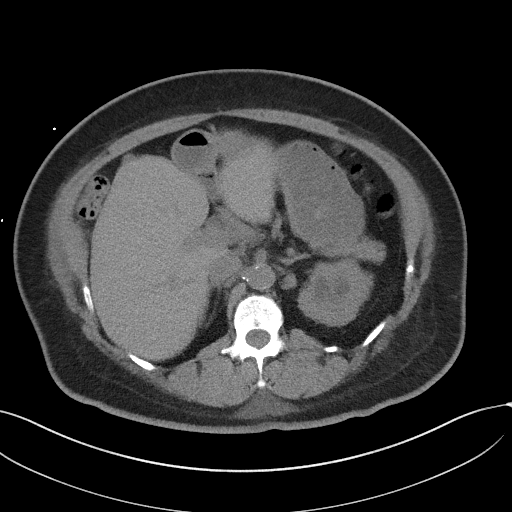
[im 69/88  soft-tissue]
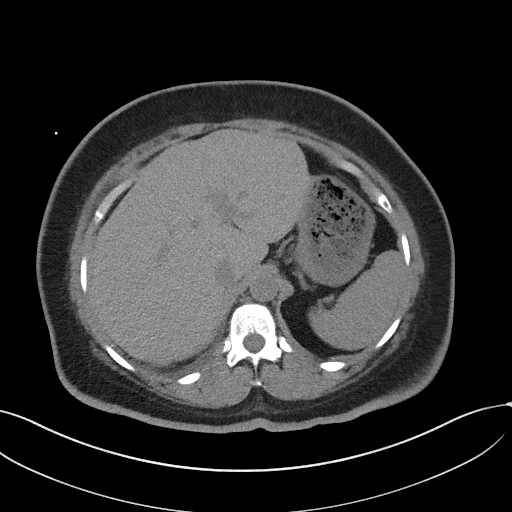
[im 77/88  soft-tissue]
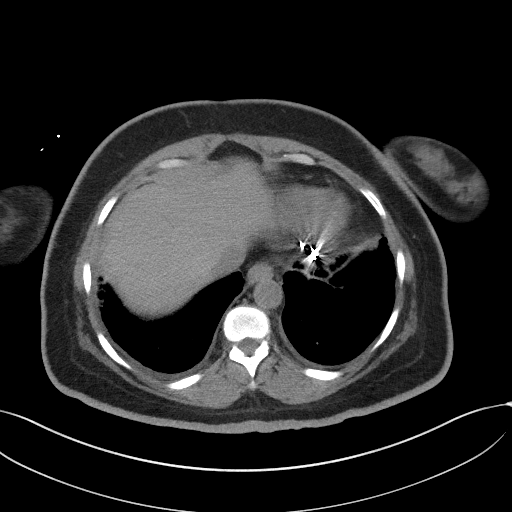
[im 84/88  soft-tissue]
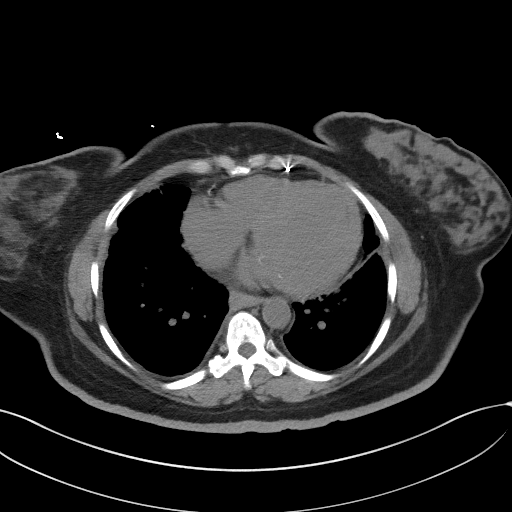

[Series 5: coronal · coronal · 0.74mm/px · 3 of 153 slices shown]
[im 51/153  soft-tissue]
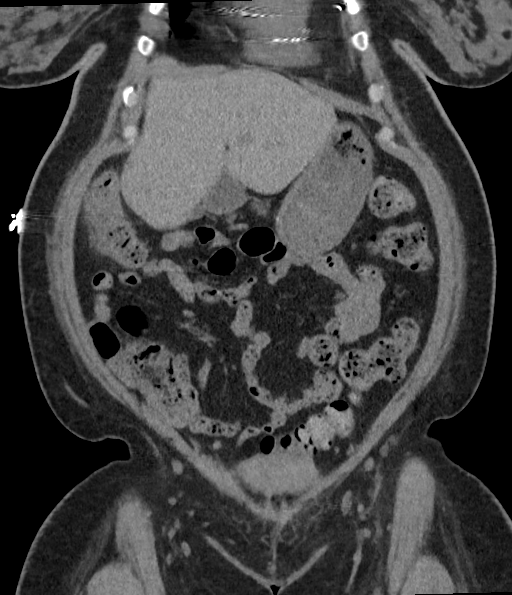
[im 68/153  soft-tissue]
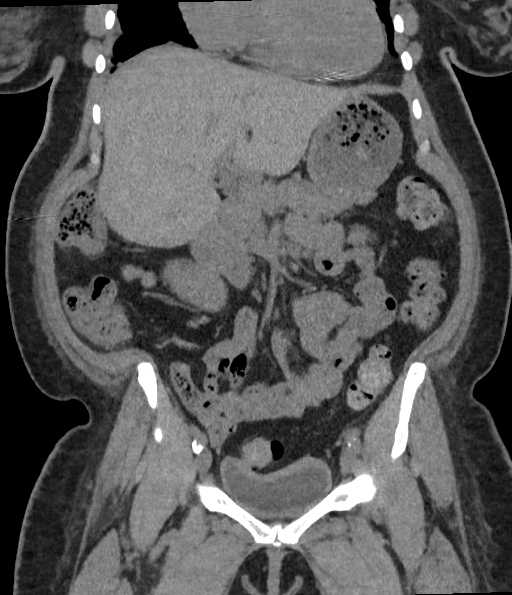
[im 85/153  soft-tissue]
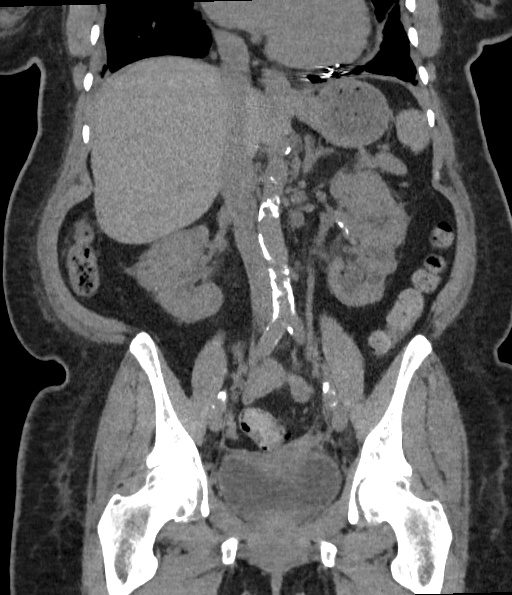

[16 of 46 positions shown; findings below may reference images not displayed]

FINDINGS: Lower chest: Mild cardiomegaly.  No acute findings.

Hepatobiliary: No focal liver abnormality is seen. No gallstones,
gallbladder wall thickening, or biliary dilatation.

Pancreas: Unremarkable. No pancreatic ductal dilatation or
surrounding inflammatory changes.

Spleen: Normal in size without focal abnormality.

Adrenals/Urinary Tract: No adrenal masses.

Left renal cortical thinning with more focal areas of cortical
scarring. No renal masses. No collecting system stones. No
hydronephrosis. Normal ureters. Bladder is unremarkable.

Stomach/Bowel: Normal stomach. Small bowel and colon are normal in
caliber. No wall thickening. No inflammation. Mild increased colonic
stool burden diffusely. Normal appendix visualized.

Vascular/Lymphatic: Dense aortic atherosclerosis. No aneurysm.
Prominent retroperitoneal lymph nodes at the level of the kidneys,
all subcentimeter in short axis.

Reproductive: Uterus and bilateral adnexa are unremarkable.

Other: No abdominal wall hernia or abnormality. No abdominopelvic
ascites.

Musculoskeletal: No fracture or acute finding. No osteoblastic or
osteolytic lesions.
IMPRESSION: 1. No acute findings within the abdomen or pelvis.
2. No renal or ureteral stones or hydronephrosis.
3. Mild increased colonic stool burden diffusely. No bowel
obstruction or inflammation.
4. Aortic atherosclerosis.

## 2020-03-01 NOTE — Consult Note (Signed)
[facility]  CONSULT NOTE    Date: 03/01/2020                  Patient Name:  Terry Arroyo  MRN: [id number]  DOB: 07-27-1961  Age / Sex: 59 y.o., female         PCP: Kirk Ruths, MD                 Service Requesting Consult: Dr. Reesa Chew                 Reason for Consult: Acute renal failure            History of Present Illness: Ms. BELEM HINTZE admitted to [facility] on 02/26/2020 for Leg edema [R60.0] Flank pain [R10.9] Hypertensive urgency [I16.0] Hypertensive emergency [I16.1] Acute on chronic heart failure [facility]) [I50.9]  Patient got IV contrast for CTA of chest looking for PE on admission. Pateint had hypertensive emergency on admission. Found to have acute exacerbation of congestive heart failure an startedon IV furosemide from 3/9 to 3/11.   Medications: Outpatient medications: Medications Prior to Admission  Medication Sig Dispense Refill Last Dose  . amLODipine (NORVASC) 5 MG tablet Take 1 tablet (5 mg total) by mouth 2 (two) times daily. (Patient taking differently: Take 5 mg by mouth daily. ) 60 tablet 0 02/26/2020 at 1000  . aspirin EC 81 MG EC tablet Take 1 tablet (81 mg total) by mouth daily. 30 tablet 0 02/26/2020 at 1000  . atorvastatin (LIPITOR) 80 MG tablet Take 1 tablet (80 mg total) by mouth daily at 6 PM. (Patient taking differently: Take 80 mg by mouth daily. ) 30 tablet 0 02/26/2020 at 1000  . Fluticasone-Salmeterol (ADVAIR) 250-50 MCG/DOSE AEPB Inhale 1 puff into the lungs 2 (two) times daily.   02/26/2020 at 1000  . furosemide (LASIX) 20 MG tablet Take 20 mg by mouth daily as needed for edema.   prn at prn  . insulin aspart protamine- aspart (NOVOLOG MIX 70/30) (70-30) 100 UNIT/ML injection Inject 0.4 mLs (40 Units total) into the skin 2 (two) times daily with a meal. (Patient taking differently: Inject 40 Units into the skin 3 (three) times daily. ) 10 mL 0 02/26/2020 at 1000  . isosorbide mononitrate (IMDUR) 60 MG 24 hr tablet Take 1  tablet (60 mg total) by mouth daily. (Patient taking differently: Take 30 mg by mouth daily. ) 30 tablet 0 02/26/2020 at 1000  . lisinopril (ZESTRIL) 30 MG tablet Take 30 mg by mouth daily.   02/26/2020 at 1000  . methocarbamol (ROBAXIN) 500 MG tablet Take 1 tablet (500 mg total) by mouth every 8 (eight) hours as needed for muscle spasms. 15 tablet 0 prn at prn  . metoprolol tartrate (LOPRESSOR) 25 MG tablet Take 1 tablet (25 mg total) by mouth 2 (two) times daily. 60 tablet 0 02/26/2020 at 1000  . mirtazapine (REMERON) 30 MG tablet Take 1 tablet (30 mg total) by mouth at bedtime. 30 tablet 0 02/25/2020 at Unknown time  . topiramate (TOPAMAX) 25 MG capsule Take 25 mg by mouth daily.   02/26/2020 at 1000  . valACYclovir (VALTREX) 500 MG tablet Take 1 tablet by mouth 2 (two) times daily.   02/26/2020 at 1000    Current medications: Current Facility-Administered Medications  Medication Dose Route Frequency Provider Last Rate Last Admin  . 0.9 %  sodium chloride infusion  250 mL Intravenous PRN Toy Baker, MD      .  acetaminophen (TYLENOL) tablet 650 mg  650 mg Oral Q6H PRN Toy Baker, MD   650 mg at 02/28/20 0842   Or  . acetaminophen (TYLENOL) suppository 650 mg  650 mg Rectal Q6H PRN Doutova, Anastassia, MD      . amLODipine (NORVASC) tablet 10 mg  10 mg Oral Daily Lorella Nimrod, MD   10 mg at 03/01/20 0853  . aspirin EC tablet 81 mg  81 mg Oral Daily Doutova, Anastassia, MD   81 mg at 03/01/20 0853  . diphenhydrAMINE (BENADRYL) capsule 25 mg  25 mg Oral Q6H PRN Sharion Settler, NP   25 mg at 02/27/20 2031  . enoxaparin (LOVENOX) injection 40 mg  40 mg Subcutaneous Q24H Lorella Nimrod, MD   40 mg at 02/29/20 2214  . insulin aspart (novoLOG) injection 0-5 Units  0-5 Units Subcutaneous QHS Amin, Soundra Pilon, MD      . insulin aspart (novoLOG) injection 0-9 Units  0-9 Units Subcutaneous TID WC Lorella Nimrod, MD   2 Units at 03/01/20 1300  . insulin aspart (novoLOG) injection 3 Units  3 Units  Subcutaneous TID WC Lorella Nimrod, MD   3 Units at 03/01/20 1259  . insulin glargine (LANTUS) injection 50 Units  50 Units Subcutaneous QHS Toy Baker, MD   50 Units at 02/29/20 2214  . isosorbide mononitrate (IMDUR) 24 hr tablet 30 mg  30 mg Oral Daily Doutova, Anastassia, MD   30 mg at 03/01/20 0853  . magnesium oxide (MAG-OX) tablet 400 mg  400 mg Oral BID Toy Baker, MD   400 mg at 03/01/20 0853  . methocarbamol (ROBAXIN) tablet 500 mg  500 mg Oral Q8H PRN Doutova, Anastassia, MD      . metoprolol succinate (TOPROL-XL) 24 hr tablet 50 mg  50 mg Oral BID Paraschos, Alexander, MD   50 mg at 03/01/20 0853  . mirtazapine (REMERON) tablet 30 mg  30 mg Oral QHS Toy Baker, MD   30 mg at 02/29/20 2214  . mometasone-formoterol (DULERA) 200-5 MCG/ACT inhaler 2 puff  2 puff Inhalation BID Toy Baker, MD   2 puff at 03/01/20 0854  . ondansetron (ZOFRAN) tablet 4 mg  4 mg Oral Q6H PRN Doutova, Anastassia, MD       Or  . ondansetron (ZOFRAN) injection 4 mg  4 mg Intravenous Q6H PRN Doutova, Anastassia, MD   4 mg at 02/28/20 0340  . oxyCODONE (Oxy IR/ROXICODONE) immediate release tablet 5 mg  5 mg Oral Q4H PRN Toy Baker, MD   5 mg at 03/01/20 1153  . polyethylene glycol (MIRALAX / GLYCOLAX) packet 17 g  17 g Oral Daily Lorella Nimrod, MD   17 g at 03/01/20 0854  . sodium chloride flush (NS) 0.9 % injection 3 mL  3 mL Intravenous Once Doutova, Anastassia, MD      . sodium chloride flush (NS) 0.9 % injection 3 mL  3 mL Intravenous Q12H Doutova, Anastassia, MD   3 mL at 03/01/20 0857  . sodium chloride flush (NS) 0.9 % injection 3 mL  3 mL Intravenous PRN Doutova, Anastassia, MD      . topiramate (TOPAMAX) tablet 25 mg  25 mg Oral Daily Doutova, Anastassia, MD   25 mg at 03/01/20 0853  . valACYclovir (VALTREX) tablet 500 mg  500 mg Oral BID Toy Baker, MD   500 mg at 03/01/20 0853      Allergies: Allergies  Allergen Reactions  . Hydralazine Shortness  Of Breath and Swelling    Body  aches  . Shellfish Allergy Anaphylaxis    Shrimp/lobster  Shrimp/lobster    . Kiwi Extract Swelling  . Metformin Other (See Comments)    Other reaction(s): GI Intolerance      Past Medical History: Past Medical History:  Diagnosis Date  . Blind left eye   . Brain tumor ([place]) 1986  . Diabetes mellitus without complication ([place])   . Hyperlipidemia   . Hypertension   . MI (myocardial infarction) ([place]) 2018     Past Surgical History: Past Surgical History:  Procedure Laterality Date  . BRAIN SURGERY    . CESAREAN SECTION       Family History: Family History  Problem Relation Age of Onset  . CAD Mother   . CAD Brother      Social History: Social History   Socioeconomic History  . Marital status: Single    Spouse name: Not on file  . Number of children: Not on file  . Years of education: Not on file  . Highest education level: Not on file  Occupational History  . Not on file  Tobacco Use  . Smoking status: Former Research scientist (life sciences)  . Smokeless tobacco: Never Used  Substance and Sexual Activity  . Alcohol use: Not Currently  . Drug use: Not Currently  . Sexual activity: Not on file  Other Topics Concern  . Not on file  Social History Narrative  . Not on file   Social Determinants of Health   Financial Resource Strain:   . Difficulty of Paying Living Expenses:   Food Insecurity:   . Worried About Charity fundraiser in the Last Year:   . Arboriculturist in the Last Year:   Transportation Needs:   . Film/video editor (Medical):   Marland Kitchen Lack of Transportation (Non-Medical):   Physical Activity:   . Days of Exercise per Week:   . Minutes of Exercise per Session:   Stress:   . Feeling of Stress :   Social Connections:   . Frequency of Communication with Friends and Family:   . Frequency of Social Gatherings with Friends and Family:   . Attends Religious Services:   . Active Member of Clubs or Organizations:   . Attends English as a second language teacher Meetings:   Marland Kitchen Marital Status:   Intimate Partner Violence:   . Fear of Current or Ex-Partner:   . Emotionally Abused:   Marland Kitchen Physically Abused:   . Sexually Abused:      Review of Systems: Review of Systems  Constitutional: Negative.  Negative for chills, diaphoresis, fever, malaise/fatigue and weight loss.  HENT: Negative.  Negative for congestion, ear discharge, ear pain, hearing loss, nosebleeds, sinus pain, sore throat and tinnitus.   Eyes: Negative.  Negative for blurred vision, double vision, photophobia, pain, discharge and redness.  Respiratory: Positive for cough, shortness of breath and wheezing. Negative for hemoptysis, sputum production and stridor.   Cardiovascular: Positive for leg swelling. Negative for chest pain, palpitations, orthopnea, claudication and PND.  Gastrointestinal: Positive for abdominal pain and diarrhea. Negative for blood in stool, constipation, heartburn, melena, nausea and vomiting.  Genitourinary: Negative.  Negative for dysuria, flank pain, frequency, hematuria and urgency.  Musculoskeletal: Negative.  Negative for back pain, falls, joint pain, myalgias and neck pain.  Skin: Negative.  Negative for itching and rash.  Neurological: Negative.  Negative for dizziness, tingling, tremors, sensory change, speech change, focal weakness, seizures, loss of consciousness, weakness and headaches.  Endo/Heme/Allergies: Negative.  Negative for environmental  allergies and polydipsia. Does not bruise/bleed easily.  Psychiatric/Behavioral: Negative.  Negative for depression, hallucinations, memory loss, substance abuse and suicidal ideas. The patient is not nervous/anxious and does not have insomnia.     Vital Signs: Blood pressure (!) 171/87, pulse 65, temperature 98.2 F (36.8 C), temperature source Oral, resp. rate 19, height 5\' 6"  (1.676 m), weight 83.6 kg, SpO2 100 %.  Weight trends: Filed Weights   02/28/20 0333 02/29/20 0158 03/01/20 0416   Weight: 79.8 kg 84.1 kg 83.6 kg    Physical Exam: General: NAD,   Head: Normocephalic, atraumatic. Moist oral mucosal membranes  Eyes: Anicteric, PERRL  Neck: Supple, trachea midline  Lungs:  Clear to auscultation  Heart: Regular rate and rhythm  Abdomen:  Soft, nontender,   Extremities:  + peripheral edema.  Neurologic: Nonfocal, moving all four extremities  Skin: No lesions         Lab results: Basic Metabolic Panel: Recent Labs  Lab 02/26/20 1302 02/26/20 1609 02/27/20 0101 02/27/20 0101 02/28/20 0301 02/28/20 0301 02/29/20 0650 02/29/20 1334 03/01/20 0818  NA   < >  --  142   < > 140   < > 137 135 139  K   < >  --  2.9*   < > 2.9*   < > 4.3 4.4 4.2  CL   < >  --  109   < > 104   < > 107 107 108  CO2   < >  --  26   < > 27   < > 20* 22 22  GLUCOSE   < >  --  239*   < > 151*   < > 214* 253* 163*  BUN   < >  --  17   < > 25*   < > 30* 30* 30*  CREATININE   < >  --  1.69*   < > 2.26*   < > 2.38* 2.11* 2.16*  CALCIUM   < >  --  8.1*   < > 7.9*   < > 8.3* 8.1* 8.3*  MG  --  1.7 1.6*  --  2.0  --   --   --   --   PHOS  --  4.2 4.4  --   --   --   --   --   --    < > = values in this interval not displayed.    Liver Function Tests: Recent Labs  Lab 02/26/20 1302 02/27/20 0101  AST 16 13*  ALT 11 10  ALKPHOS 64 70  BILITOT 0.5 0.8  PROT 6.5 6.2*  ALBUMIN 2.4* 2.3*   No results for input(s): LIPASE, AMYLASE in the last 168 hours. No results for input(s): AMMONIA in the last 168 hours.  CBC: Recent Labs  Lab 02/26/20 1302 02/27/20 0101  WBC 9.4 10.2  HGB 12.1 11.7*  HCT 36.7 35.1*  MCV 85.9 86.7  PLT 369 372    Cardiac Enzymes: No results for input(s): CKTOTAL, CKMB, CKMBINDEX, TROPONINI in the last 168 hours.  BNP: Invalid input(s): POCBNP  CBG: Recent Labs  Lab 02/29/20 1206 02/29/20 1622 02/29/20 2003 03/01/20 0816 03/01/20 1246  GLUCAP 253* 227* 171* 169* 184*    Microbiology: Results for orders placed or performed during the  hospital encounter of 02/26/20  SARS CORONAVIRUS 2 (TAT 6-24 HRS) Nasopharyngeal Nasopharyngeal Swab     Status: None   Collection Time: 02/26/20  6:04 PM   Specimen: Nasopharyngeal  Swab  Result Value Ref Range Status   SARS Coronavirus 2 NEGATIVE NEGATIVE Final    Comment: (NOTE) SARS-CoV-2 target nucleic acids are NOT DETECTED. The SARS-CoV-2 RNA is generally detectable in upper and lower respiratory specimens during the acute phase of infection. Negative results do not preclude SARS-CoV-2 infection, do not rule out co-infections with other pathogens, and should not be used as the sole basis for treatment or other patient management decisions. Negative results must be combined with clinical observations, patient history, and epidemiological information. The expected result is Negative. Fact Sheet for Patients: SugarRoll.be Fact Sheet for Healthcare Providers: https://www.woods-mathews.com/ This test is not yet approved or cleared by the [place] FDA and  has been authorized for detection and/or diagnosis of SARS-CoV-2 by FDA under an Emergency Use Authorization (EUA). This EUA will remain  in effect (meaning this test can be used) for the duration of the COVID-19 declaration under Section 56 4(b)(1) of the Act, 21 U.S.C. section 360bbb-3(b)(1), unless the authorization is terminated or revoked sooner. Performed at [facility], [place] [address], [place], [place] [address]     Coagulation Studies: No results for input(s): LABPROT, INR in the last 72 hours.  Urinalysis: No results for input(s): COLORURINE, LABSPEC, PHURINE, GLUCOSEU, HGBUR, BILIRUBINUR, KETONESUR, PROTEINUR, UROBILINOGEN, NITRITE, LEUKOCYTESUR in the last 72 hours.  Invalid input(s): APPERANCEUR    Imaging: US Venous Img Lower Bilateral (DVT)  Result Date: 03/01/2020 CLINICAL DATA:  59 year old with bilateral leg pain and calf tenderness. EXAM: BILATERAL  LOWER EXTREMITY VENOUS DOPPLER ULTRASOUND TECHNIQUE: Gray-scale sonography with graded compression, as well as color Doppler and duplex ultrasound were performed to evaluate the lower extremity deep venous systems from the level of the common femoral vein and including the common femoral, femoral, profunda femoral, popliteal and calf veins including the posterior tibial, peroneal and gastrocnemius veins when visible. The superficial great saphenous vein was also interrogated. Spectral Doppler was utilized to evaluate flow at rest and with distal augmentation maneuvers in the common femoral, femoral and popliteal veins. COMPARISON:  None. FINDINGS: RIGHT LOWER EXTREMITY Common Femoral Vein: No evidence of thrombus. Normal compressibility, respiratory phasicity and response to augmentation. Saphenofemoral Junction: No evidence of thrombus. Normal compressibility and flow on color Doppler imaging. Profunda Femoral Vein: No evidence of thrombus. Normal compressibility and flow on color Doppler imaging. Femoral Vein: No evidence of thrombus. Normal compressibility, respiratory phasicity and response to augmentation. Popliteal Vein: No evidence of thrombus. Normal compressibility, respiratory phasicity and response to augmentation. Calf Veins: No evidence of thrombus. Normal compressibility and flow on color Doppler imaging. LEFT LOWER EXTREMITY Common Femoral Vein: No evidence of thrombus. Normal compressibility, respiratory phasicity and response to augmentation. Saphenofemoral Junction: No evidence of thrombus. Normal compressibility and flow on color Doppler imaging. Profunda Femoral Vein: No evidence of thrombus. Normal compressibility and flow on color Doppler imaging. Femoral Vein: No evidence of thrombus. Normal compressibility, respiratory phasicity and response to augmentation. Popliteal Vein: No evidence of thrombus. Normal compressibility, respiratory phasicity and response to augmentation. Calf Veins: No  evidence of thrombus. Normal compressibility and flow on color Doppler imaging. IMPRESSION: No evidence of deep venous thrombosis in either lower extremity. Electronically Signed   By: Markus Daft M.D.   On: 03/01/2020 14:17   CT RENAL STONE STUDY  Result Date: 03/01/2020 CLINICAL DATA:  Left-sided abdominal/pelvic pain beginning this morning. EXAM: CT ABDOMEN AND PELVIS WITHOUT CONTRAST TECHNIQUE: Multidetector CT imaging of the abdomen and pelvis was performed following the standard protocol without IV  contrast. COMPARISON:  Renal ultrasound, 02/26/2020 FINDINGS: Lower chest: Mild cardiomegaly.  No acute findings. Hepatobiliary: No focal liver abnormality is seen. No gallstones, gallbladder wall thickening, or biliary dilatation. Pancreas: Unremarkable. No pancreatic ductal dilatation or surrounding inflammatory changes. Spleen: Normal in size without focal abnormality. Adrenals/Urinary Tract: No adrenal masses. Left renal cortical thinning with more focal areas of cortical scarring. No renal masses. No collecting system stones. No hydronephrosis. Normal ureters. Bladder is unremarkable. Stomach/Bowel: Normal stomach. Small bowel and colon are normal in caliber. No wall thickening. No inflammation. Mild increased colonic stool burden diffusely. Normal appendix visualized. Vascular/Lymphatic: Dense aortic atherosclerosis. No aneurysm. Prominent retroperitoneal lymph nodes at the level of the kidneys, all subcentimeter in short axis. Reproductive: Uterus and bilateral adnexa are unremarkable. Other: No abdominal wall hernia or abnormality. No abdominopelvic ascites. Musculoskeletal: No fracture or acute finding. No osteoblastic or osteolytic lesions. IMPRESSION: 1. No acute findings within the abdomen or pelvis. 2. No renal or ureteral stones or hydronephrosis. 3. Mild increased colonic stool burden diffusely. No bowel obstruction or inflammation. 4. Aortic atherosclerosis. Electronically Signed   By: Lajean Manes M.D.   On: 03/01/2020 13:54      Assessment & Plan: Ms. JOELYS STAUBS is a 59 y.o. black female with diabetes mellitus type II, hypertension, COPD, coronary artery disease status post CABG, sleep apnea, CVA, left eye blindness, congestive heart failure , who was admitted to [facility] on 02/26/2020 for Leg edema [R60.0] Flank pain [R10.9] Hypertensive urgency [I16.0] Hypertensive emergency [I16.1] Acute on chronic heart failure ([place]) [I50.9]  1. Acute renal failure: on chronic kidney disease stage IIIB with proteinuria: baseline creatinine of 1.55, GFR of 37 on 11/26/2019.  Chronic Kidney Disease most likely secondary to diabetic nephropathy with glycosuria on urinalysis.  Acute renal failure secondary to hypertensive urgency, acute cardiorenal syndrome, IV contrast exposure and possible obstructive uropathy.  CT with no signs of obstruction IV contrast exposure on 02/26/2020 - holding lisinopril.   2. Hypertension: emergency on admission with chest pain. Allergy to hydralazine Current regimen of amlodipine, isosorbide mononitrate, metoprolol.   3. Diabetes mellitus type II with chronic kidney disease: insulin dependent. History of poor control. Hemoglobin A1c of 10.2% on 02/27/20.    LOS: 3 Charlis Harner 3/13/20213:56 PM

## 2020-03-01 NOTE — Progress Notes (Signed)
PROGRESS NOTE    Terry Arroyo  MBT:[id number] DOB: Jan 17, 1961 DOA: 02/26/2020 PCP: Kirk Ruths, MD   Brief Narrative:  Terry Arroyo is a 59 y.o. female with medical history significant of DM2, COPD, CAD s/p CABG, depression, OSA, HTN, sciatica, Hx of Stroke, HLD, brain tumor status post resection and left eye blindness,Presented with generalized edema and feeling somewhat lightheaded.  Apparently her torsemide was increased recently but did not make much difference.  Recent admission in February for hypertensive urgency and nonspecific chest pain.  Myoview done during that time with new decreased in EF from normal to less than 30%.  Prior echo done in December with normal EF and diastolic dysfunction.  Found to have elevated blood pressure, evidence of volume overload and hypokalemia.  Blood pressure improved with nitro drip in ED. she was started on IV Lasix.  Repeat echo was ordered.  Subjective: Patient was complaining of left calf tenderness with worsening swelling. Denies any shortness of breath or chest pain.  Assessment & Plan:   Active Problems:   Nonspecific chest pain   Hypertensive urgency   DM2 (diabetes mellitus, type 2) (HCC)   CKD (chronic kidney disease), stage III   Hyponatremia   Anasarca   Proteinuria   Hypoalbuminemia   Elevated troponin   Acute on chronic systolic CHF (congestive heart failure) (HCC)   Acute on chronic heart failure (HCC)   Leg edema  Acute on chronic combined systolic and diastolic heart failure.  Patient with hypertensive and ischemic cardiomyopathy, new diagnosis of decreased EF on Myoview done in February.  TSH within normal limit.  Clinically improving. Repeat echogram-with EF of 30 to 35%.  IVC with more than 50% collapsibility, most consistent with euvolemia. CTA done due to elevated D-dimer and hypoxia which was negative for PE.  Lower extremity venous Doppler was negative for DVT. Cardiology was consulted-appreciate the  recommendations. -No need for further ischemic work-up at this time, they will arrange as an outpatient.  She will follow up with cardiology as an outpatient for further management. She appears euvolemic with some lower extremity edema, left worse than right. Unable to diurese due to worsening renal function. -Daily weight. -Daily BMP. -Strict intake and output.  AKI with CKD stage III.  Creatinine worsened today when with holding Lasix yesterday.  Baseline around 1.3-1.5. Creatinine remained above 2, no change with giving some gentle fluid. Renal ultrasound done on 02/26/2020 with some possible mild hydronephrosis and renal stone. She denies any abdominal, flank pain. No gross hematuria. -Nephrology consult. -Get CT abdomen-renal stone study. -Holding Lasix. -Continue to monitor. -Avoid nephrotoxins  Left calf tenderness with edema. Patient was complaining of left calf tenderness with worsening edema more prominent on left. DVT was negative on admission. Patient was on DVT prophylaxis. -Recheck venous Doppler for left lower extremity.  Hypertension.  Hypertensive urgency resolved. Patient was discharged on hydralazine during prior hospitalization, apparently later developed a allergic reaction and was taken off. -Blood pressure remained elevated. -Increase home dose of amlodipine to 10 mg daily. -Should get ACE or ARB once renal function improves. -Add as needed labetalol.  Chest pain/CAD. patient was having nonspecific chest discomfort most likely secondary to elevated blood pressure.  Currently resolved. Mildly elevated troponin most likely due to demand. No acute changes in EKG. -Continue to monitor -Continue home dose of Imdur.  Electrolyte abnormalities.  Electrolytes within normal limits today. -Replete electrolytes as needed and monitor.  Type 2 diabetes.  A1c improved to 10.2 from  14.1 in 1 month. CBG within goal. -Continue Lantus 50 units. -Continue  SSI.  Proteinuria/hypoalbuminemia.  Pre Albumin within normal limit.  Patient has CKD. -Nephrology was consulted today.  OSA.  Per patient she is not on CPAP.  Objective: Vitals:   02/29/20 1622 02/29/20 1920 03/01/20 0416 03/01/20 0815  BP: (!) 147/79 (!) 149/82 (!) 163/89 (!) 171/87  Pulse: 64 65 64 65  Resp: 19 19 19 19   Temp: 98.4 F (36.9 C) 98.2 F (36.8 C) 98.3 F (36.8 C) 98.2 F (36.8 C)  TempSrc: Oral Oral Oral Oral  SpO2: 99% 98% 99% 100%  Weight:   83.6 kg   Height:        Intake/Output Summary (Last 24 hours) at 03/01/2020 1151 Last data filed at 03/01/2020 0950 Gross per 24 hour  Intake 1680 ml  Output 4000 ml  Net -2320 ml   Filed Weights   02/28/20 0333 02/29/20 0158 03/01/20 0416  Weight: 79.8 kg 84.1 kg 83.6 kg    Examination:  General exam: Appears calm and comfortable  Respiratory system: Clear to auscultation. Respiratory effort normal. Cardiovascular system: S1 & S2 heard, RRR. No JVD, murmurs, rubs, gallops or clicks. Gastrointestinal system: Soft, nontender, nondistended, bowel sounds positive. Central nervous system: Alert and oriented. No focal neurological deficits.Symmetric 5 x 5 power. Extremities: 1-2+ lower extremity edema, worse on left than right with mild left calf tenderness. Skin: No rashes, lesions or ulcers Psychiatry: Judgement and insight appear normal.   DVT prophylaxis: Lovenox Code Status: Full Family Communication: Discussed plan with patient. Disposition Plan: Pending improvement in her renal function. Persistently elevated creatinine-nephrology was consulted as patient needs diuresis, started building up some fluid.  Consultants:   Cardiology  Nephrology  Procedures:  Antimicrobials:   Data Reviewed: I have personally reviewed following labs and imaging studies  CBC: Recent Labs  Lab 02/26/20 1302 02/27/20 0101  WBC 9.4 10.2  HGB 12.1 11.7*  HCT 36.7 35.1*  MCV 85.9 86.7  PLT 369 536   Basic  Metabolic Panel: Recent Labs  Lab 02/26/20 1302 02/26/20 1609 02/27/20 0101 02/28/20 0301 02/29/20 0650 02/29/20 1334 03/01/20 0818  NA   < >  --  142 140 137 135 139  K   < >  --  2.9* 2.9* 4.3 4.4 4.2  CL   < >  --  109 104 107 107 108  CO2   < >  --  26 27 20* 22 22  GLUCOSE   < >  --  239* 151* 214* 253* 163*  BUN   < >  --  17 25* 30* 30* 30*  CREATININE   < >  --  1.69* 2.26* 2.38* 2.11* 2.16*  CALCIUM   < >  --  8.1* 7.9* 8.3* 8.1* 8.3*  MG  --  1.7 1.6* 2.0  --   --   --   PHOS  --  4.2 4.4  --   --   --   --    < > = values in this interval not displayed.   GFR: Estimated Creatinine Clearance: 31.3 mL/min (A) (by C-G formula based on SCr of 2.16 mg/dL (H)). Liver Function Tests: Recent Labs  Lab 02/26/20 1302 02/27/20 0101  AST 16 13*  ALT 11 10  ALKPHOS 64 70  BILITOT 0.5 0.8  PROT 6.5 6.2*  ALBUMIN 2.4* 2.3*   No results for input(s): LIPASE, AMYLASE in the last 168 hours. No results for input(s): AMMONIA in  the last 168 hours. Coagulation Profile: Recent Labs  Lab 02/26/20 1926  INR 0.9   Cardiac Enzymes: No results for input(s): CKTOTAL, CKMB, CKMBINDEX, TROPONINI in the last 168 hours. BNP (last 3 results) No results for input(s): PROBNP in the last 8760 hours. HbA1C: No results for input(s): HGBA1C in the last 72 hours. CBG: Recent Labs  Lab 02/29/20 0732 02/29/20 1206 02/29/20 1622 02/29/20 2003 03/01/20 0816  GLUCAP 205* 253* 227* 171* 169*   Lipid Profile: No results for input(s): CHOL, HDL, LDLCALC, TRIG, CHOLHDL, LDLDIRECT in the last 72 hours. Thyroid Function Tests: No results for input(s): TSH, T4TOTAL, FREET4, T3FREE, THYROIDAB in the last 72 hours. Anemia Panel: No results for input(s): VITAMINB12, FOLATE, FERRITIN, TIBC, IRON, RETICCTPCT in the last 72 hours. Sepsis Labs: No results for input(s): PROCALCITON, LATICACIDVEN in the last 168 hours.  Recent Results (from the past 240 hour(s))  SARS CORONAVIRUS 2 (TAT 6-24 HRS)  Nasopharyngeal Nasopharyngeal Swab     Status: None   Collection Time: 02/26/20  6:04 PM   Specimen: Nasopharyngeal Swab  Result Value Ref Range Status   SARS Coronavirus 2 NEGATIVE NEGATIVE Final    Comment: (NOTE) SARS-CoV-2 target nucleic acids are NOT DETECTED. The SARS-CoV-2 RNA is generally detectable in upper and lower respiratory specimens during the acute phase of infection. Negative results do not preclude SARS-CoV-2 infection, do not rule out co-infections with other pathogens, and should not be used as the sole basis for treatment or other patient management decisions. Negative results must be combined with clinical observations, patient history, and epidemiological information. The expected result is Negative. Fact Sheet for Patients: SugarRoll.be Fact Sheet for Healthcare Providers: https://www.woods-mathews.com/ This test is not yet approved or cleared by the [place] FDA and  has been authorized for detection and/or diagnosis of SARS-CoV-2 by FDA under an Emergency Use Authorization (EUA). This EUA will remain  in effect (meaning this test can be used) for the duration of the COVID-19 declaration under Section 56 4(b)(1) of the Act, 21 U.S.C. section 360bbb-3(b)(1), unless the authorization is terminated or revoked sooner. Performed at [facility], [place] [address], [place], [place] [address]      Radiology Studies: No results found.  Scheduled Meds: . amLODipine  10 mg Oral Daily  . aspirin EC  81 mg Oral Daily  . enoxaparin (LOVENOX) injection  40 mg Subcutaneous Q24H  . insulin aspart  0-5 Units Subcutaneous QHS  . insulin aspart  0-9 Units Subcutaneous TID WC  . insulin aspart  3 Units Subcutaneous TID WC  . insulin glargine  50 Units Subcutaneous QHS  . isosorbide mononitrate  30 mg Oral Daily  . magnesium oxide  400 mg Oral BID  . metoprolol succinate  50 mg Oral BID  . mirtazapine  30 mg Oral QHS  .  mometasone-formoterol  2 puff Inhalation BID  . polyethylene glycol  17 g Oral Daily  . sodium chloride flush  3 mL Intravenous Once  . sodium chloride flush  3 mL Intravenous Q12H  . topiramate  25 mg Oral Daily  . valACYclovir  500 mg Oral BID   Continuous Infusions: . sodium chloride       LOS: 3 days   Time spent: 40 minutes  Lorella Nimrod, MD [facility]  If 7PM-7AM, please contact night-coverage [email or web address]  03/01/2020, 11:51 AM   This record has been created using Systems analyst. Errors have been sought and corrected,but may not always be located. Such  creation errors do not reflect on the standard of care.

## 2020-03-02 DIAGNOSIS — M79669 Pain in unspecified lower leg: Secondary | ICD-10-CM

## 2020-03-02 DIAGNOSIS — R079 Chest pain, unspecified: Secondary | ICD-10-CM

## 2020-03-02 DIAGNOSIS — R778 Other specified abnormalities of plasma proteins: Secondary | ICD-10-CM

## 2020-03-02 DIAGNOSIS — E8809 Other disorders of plasma-protein metabolism, not elsewhere classified: Secondary | ICD-10-CM

## 2020-03-02 DIAGNOSIS — R809 Proteinuria, unspecified: Secondary | ICD-10-CM

## 2020-03-02 LAB — GLUCOSE, CAPILLARY
Glucose-Capillary: 183 mg/dL — ABNORMAL HIGH (ref 70–99)
Glucose-Capillary: 273 mg/dL — ABNORMAL HIGH (ref 70–99)

## 2020-03-02 LAB — RENAL FUNCTION PANEL
Albumin: 2.6 g/dL — ABNORMAL LOW (ref 3.5–5.0)
Anion gap: 12 (ref 5–15)
BUN: 36 mg/dL — ABNORMAL HIGH (ref 6–20)
CO2: 23 mmol/L (ref 22–32)
Calcium: 8.8 mg/dL — ABNORMAL LOW (ref 8.9–10.3)
Chloride: 104 mmol/L (ref 98–111)
Creatinine, Ser: 2.21 mg/dL — ABNORMAL HIGH (ref 0.44–1.00)
GFR calc Af Amer: 28 mL/min — ABNORMAL LOW (ref >60)
GFR calc non Af Amer: 24 mL/min — ABNORMAL LOW (ref >60)
Glucose, Bld: 221 mg/dL — ABNORMAL HIGH (ref 70–99)
Phosphorus: 3.9 mg/dL (ref 2.5–4.6)
Potassium: 4.2 mmol/L (ref 3.5–5.1)
Sodium: 139 mmol/L (ref 135–145)

## 2020-03-02 LAB — HEPATITIS C ANTIBODY: HCV Ab: NONREACTIVE

## 2020-03-02 LAB — HEPATITIS B SURFACE ANTIGEN: Hepatitis B Surface Ag: NONREACTIVE

## 2020-03-02 LAB — HEPATITIS B CORE ANTIBODY, IGM: Hep B C IgM: NONREACTIVE

## 2020-03-02 LAB — HEPATITIS B SURFACE ANTIBODY,QUALITATIVE: Hep B S Ab: REACTIVE — AB

## 2020-03-02 MED ORDER — AMLODIPINE BESYLATE 10 MG PO TABS: 10.0000 mg | ORAL_TABLET | Freq: Every day | ORAL | 0 refills | Status: DC

## 2020-03-02 MED ORDER — METOPROLOL SUCCINATE ER 50 MG PO TB24: 50.0000 mg | ORAL_TABLET | Freq: Two times a day (BID) | ORAL | 0 refills | Status: DC

## 2020-03-02 MED ORDER — FUROSEMIDE 40 MG PO TABS
40.0000 mg | ORAL_TABLET | Freq: Every day | ORAL | Status: DC
  Administered 2020-03-02: 40 mg via ORAL
  Filled 2020-03-02: qty 1

## 2020-03-02 MED ORDER — FUROSEMIDE 40 MG PO TABS: 40.0000 mg | ORAL_TABLET | Freq: Every day | ORAL | 1 refills | Status: DC

## 2020-03-02 NOTE — Progress Notes (Signed)
[facility]  ROUNDING NOTE   Subjective:   Patient has many questions about the diagnosis of congestive heart failure and kidney failure.   Sitting up in bed. No complaints.   Objective:  Vital signs in last 24 hours:  Temp:  [97.3 F (36.3 C)-98.7 F (37.1 C)] 97.9 F (36.6 C) (03/14 1111) Pulse Rate:  [57-64] 64 (03/14 1111) Resp:  [18-20] 20 (03/14 0422) BP: (146-153)/(70-92) 146/70 (03/14 1111) SpO2:  [99 %-100 %] 100 % (03/14 1111) Weight:  [83.6 kg] 83.6 kg (03/14 0422)  Weight change: 0.045 kg Filed Weights   02/29/20 0158 03/01/20 0416 03/02/20 0422  Weight: 84.1 kg 83.6 kg 83.6 kg    Intake/Output: I/O last 3 completed shifts: In: 20 [P.O.:720] Out: 5550 [Urine:5550]   Intake/Output this shift:  Total I/O In: 120 [P.O.:120] Out: 1000 [Urine:1000]  Physical Exam: General: NAD,   Head: Normocephalic, atraumatic. Moist oral mucosal membranes  Eyes: Anicteric, PERRL  Neck: Supple, trachea midline  Lungs:  Clear to auscultation  Heart: Regular rate and rhythm  Abdomen:  Soft, nontender,   Extremities:  trace peripheral edema.  Neurologic: Nonfocal, moving all four extremities  Skin: No lesions        Basic Metabolic Panel: Recent Labs  Lab 02/26/20 1302 02/26/20 1609 02/27/20 0101 02/27/20 0101 02/28/20 0301 02/28/20 0301 02/29/20 0650 02/29/20 0650 02/29/20 1334 03/01/20 0818 03/02/20 0430  NA   < >  --  142   < > 140  --  137  --  135 139 139  K   < >  --  2.9*   < > 2.9*  --  4.3  --  4.4 4.2 4.2  CL   < >  --  109   < > 104  --  107  --  107 108 104  CO2   < >  --  26   < > 27  --  20*  --  22 22 23   GLUCOSE   < >  --  239*   < > 151*  --  214*  --  253* 163* 221*  BUN   < >  --  17   < > 25*  --  30*  --  30* 30* 36*  CREATININE   < >  --  1.69*   < > 2.26*  --  2.38*  --  2.11* 2.16* 2.21*  CALCIUM   < >  --  8.1*   < > 7.9*   < > 8.3*   < > 8.1* 8.3* 8.8*  MG  --  1.7 1.6*  --  2.0  --   --   --   --   --   --   PHOS   --  4.2 4.4  --   --   --   --   --   --   --  3.9   < > = values in this interval not displayed.    Liver Function Tests: Recent Labs  Lab 02/26/20 1302 02/27/20 0101 03/02/20 0430  AST 16 13*  --   ALT 11 10  --   ALKPHOS 64 70  --   BILITOT 0.5 0.8  --   PROT 6.5 6.2*  --   ALBUMIN 2.4* 2.3* 2.6*   No results for input(s): LIPASE, AMYLASE in the last 168 hours. No results for input(s): AMMONIA in the last 168 hours.  CBC: Recent Labs  Lab 02/26/20 1302 02/27/20  0101  WBC 9.4 10.2  HGB 12.1 11.7*  HCT 36.7 35.1*  MCV 85.9 86.7  PLT 369 372    Cardiac Enzymes: No results for input(s): CKTOTAL, CKMB, CKMBINDEX, TROPONINI in the last 168 hours.  BNP: Invalid input(s): POCBNP  CBG: Recent Labs  Lab 03/01/20 1246 03/01/20 1649 03/01/20 2114 03/02/20 0725 03/02/20 1112  GLUCAP 184* 140* 170* 183* 273*    Microbiology: Results for orders placed or performed during the hospital encounter of 02/26/20  SARS CORONAVIRUS 2 (TAT 6-24 HRS) Nasopharyngeal Nasopharyngeal Swab     Status: None   Collection Time: 02/26/20  6:04 PM   Specimen: Nasopharyngeal Swab  Result Value Ref Range Status   SARS Coronavirus 2 NEGATIVE NEGATIVE Final    Comment: (NOTE) SARS-CoV-2 target nucleic acids are NOT DETECTED. The SARS-CoV-2 RNA is generally detectable in upper and lower respiratory specimens during the acute phase of infection. Negative results do not preclude SARS-CoV-2 infection, do not rule out co-infections with other pathogens, and should not be used as the sole basis for treatment or other patient management decisions. Negative results must be combined with clinical observations, patient history, and epidemiological information. The expected result is Negative. Fact Sheet for Patients: SugarRoll.be Fact Sheet for Healthcare Providers: https://www.woods-mathews.com/ This test is not yet approved or cleared by the [place] FDA and  has been authorized for detection and/or diagnosis of SARS-CoV-2 by FDA under an Emergency Use Authorization (EUA). This EUA will remain  in effect (meaning this test can be used) for the duration of the COVID-19 declaration under Section 56 4(b)(1) of the Act, 21 U.S.C. section 360bbb-3(b)(1), unless the authorization is terminated or revoked sooner. Performed at [facility], [place] [address] [place], [place] [address]     Coagulation Studies: No results for input(s): LABPROT, INR in the last 72 hours.  Urinalysis: No results for input(s): COLORURINE, LABSPEC, PHURINE, GLUCOSEU, HGBUR, BILIRUBINUR, KETONESUR, PROTEINUR, UROBILINOGEN, NITRITE, LEUKOCYTESUR in the last 72 hours.  Invalid input(s): APPERANCEUR    Imaging: US Venous Img Lower Bilateral (DVT)  Result Date: 03/01/2020 CLINICAL DATA:  59 year old with bilateral leg pain and calf tenderness. EXAM: BILATERAL LOWER EXTREMITY VENOUS DOPPLER ULTRASOUND TECHNIQUE: Gray-scale sonography with graded compression, as well as color Doppler and duplex ultrasound were performed to evaluate the lower extremity deep venous systems from the level of the common femoral vein and including the common femoral, femoral, profunda femoral, popliteal and calf veins including the posterior tibial, peroneal and gastrocnemius veins when visible. The superficial great saphenous vein was also interrogated. Spectral Doppler was utilized to evaluate flow at rest and with distal augmentation maneuvers in the common femoral, femoral and popliteal veins. COMPARISON:  None. FINDINGS: RIGHT LOWER EXTREMITY Common Femoral Vein: No evidence of thrombus. Normal compressibility, respiratory phasicity and response to augmentation. Saphenofemoral Junction: No evidence of thrombus. Normal compressibility and flow on color Doppler imaging. Profunda Femoral Vein: No evidence of thrombus. Normal compressibility and flow on color Doppler imaging. Femoral  Vein: No evidence of thrombus. Normal compressibility, respiratory phasicity and response to augmentation. Popliteal Vein: No evidence of thrombus. Normal compressibility, respiratory phasicity and response to augmentation. Calf Veins: No evidence of thrombus. Normal compressibility and flow on color Doppler imaging. LEFT LOWER EXTREMITY Common Femoral Vein: No evidence of thrombus. Normal compressibility, respiratory phasicity and response to augmentation. Saphenofemoral Junction: No evidence of thrombus. Normal compressibility and flow on color Doppler imaging. Profunda Femoral Vein: No evidence of thrombus. Normal compressibility and flow on color Doppler  imaging. Femoral Vein: No evidence of thrombus. Normal compressibility, respiratory phasicity and response to augmentation. Popliteal Vein: No evidence of thrombus. Normal compressibility, respiratory phasicity and response to augmentation. Calf Veins: No evidence of thrombus. Normal compressibility and flow on color Doppler imaging. IMPRESSION: No evidence of deep venous thrombosis in either lower extremity. Electronically Signed   By: Markus Daft M.D.   On: 03/01/2020 14:17   CT RENAL STONE STUDY  Result Date: 03/01/2020 CLINICAL DATA:  Left-sided abdominal/pelvic pain beginning this morning. EXAM: CT ABDOMEN AND PELVIS WITHOUT CONTRAST TECHNIQUE: Multidetector CT imaging of the abdomen and pelvis was performed following the standard protocol without IV contrast. COMPARISON:  Renal ultrasound, 02/26/2020 FINDINGS: Lower chest: Mild cardiomegaly.  No acute findings. Hepatobiliary: No focal liver abnormality is seen. No gallstones, gallbladder wall thickening, or biliary dilatation. Pancreas: Unremarkable. No pancreatic ductal dilatation or surrounding inflammatory changes. Spleen: Normal in size without focal abnormality. Adrenals/Urinary Tract: No adrenal masses. Left renal cortical thinning with more focal areas of cortical scarring. No renal masses. No  collecting system stones. No hydronephrosis. Normal ureters. Bladder is unremarkable. Stomach/Bowel: Normal stomach. Small bowel and colon are normal in caliber. No wall thickening. No inflammation. Mild increased colonic stool burden diffusely. Normal appendix visualized. Vascular/Lymphatic: Dense aortic atherosclerosis. No aneurysm. Prominent retroperitoneal lymph nodes at the level of the kidneys, all subcentimeter in short axis. Reproductive: Uterus and bilateral adnexa are unremarkable. Other: No abdominal wall hernia or abnormality. No abdominopelvic ascites. Musculoskeletal: No fracture or acute finding. No osteoblastic or osteolytic lesions. IMPRESSION: 1. No acute findings within the abdomen or pelvis. 2. No renal or ureteral stones or hydronephrosis. 3. Mild increased colonic stool burden diffusely. No bowel obstruction or inflammation. 4. Aortic atherosclerosis. Electronically Signed   By: Lajean Manes M.D.   On: 03/01/2020 13:54     Medications:   . sodium chloride     . amLODipine  10 mg Oral Daily  . aspirin EC  81 mg Oral Daily  . enoxaparin (LOVENOX) injection  40 mg Subcutaneous Q24H  . furosemide  40 mg Oral Daily  . insulin aspart  0-5 Units Subcutaneous QHS  . insulin aspart  0-9 Units Subcutaneous TID WC  . insulin aspart  3 Units Subcutaneous TID WC  . insulin glargine  50 Units Subcutaneous QHS  . isosorbide mononitrate  30 mg Oral Daily  . magnesium oxide  400 mg Oral BID  . metoprolol succinate  50 mg Oral BID  . mirtazapine  30 mg Oral QHS  . mometasone-formoterol  2 puff Inhalation BID  . polyethylene glycol  17 g Oral Daily  . sodium chloride flush  3 mL Intravenous Once  . sodium chloride flush  3 mL Intravenous Q12H  . topiramate  25 mg Oral Daily  . valACYclovir  500 mg Oral BID   sodium chloride, acetaminophen **OR** acetaminophen, diphenhydrAMINE, methocarbamol, ondansetron **OR** ondansetron (ZOFRAN) IV, oxyCODONE, sodium chloride flush  Assessment/  Plan:  [name] is a 59 y.o. black female with diabetes mellitus type II, hypertension, COPD, coronary artery disease status post CABG, sleep apnea, CVA, left eye blindness, congestive heart failure , who was admitted to [facility] on 02/26/2020 for Leg edema [R60.0] Flank pain [R10.9] Hypertensive urgency [I16.0] Hypertensive emergency [I16.1] Acute on chronic heart failure (Mark) [I50.9]  1. Acute renal failure: on chronic kidney disease stage IIIB with proteinuria: baseline creatinine of 1.55, GFR of 37 on 11/26/2019.  Chronic Kidney Disease most likely secondary to diabetic nephropathy with  glycosuria on urinalysis.  Acute renal failure secondary to hypertensive urgency, acute cardiorenal syndrome, IV contrast exposure and possible obstructive uropathy.  CT with no signs of obstruction IV contrast exposure on 02/26/2020 - restart lisinopril.   2. Hypertension: emergency on admission with chest pain. Allergy to hydralazine Current regimen of amlodipine, isosorbide mononitrate, metoprolol. - restart furosemide and lisinopril.    3. Diabetes mellitus type II with chronic kidney disease: insulin dependent. History of poor control. Hemoglobin A1c of 10.2% on 02/27/20.   4. Acute exacerbation of chronic systolic congestive heart failure: EF of 30-35% on echo from 02/27/20.  - Fluid restriction.  - Furosemide as above.   Patient will need close outpatient follow up with nephrology.     LOS: 4 Dhani Imel 3/14/202111:39 AM

## 2020-03-02 NOTE — Discharge Summary (Signed)
Physician Discharge Summary  Terry Arroyo GYK:[id number] DOB: March 26, 1961 DOA: 02/26/2020  PCP: Kirk Ruths, MD  Admit date: 02/26/2020 Discharge date: 03/02/2020  Admitted From: Home Disposition: Home  Recommendations for Outpatient Follow-up:  1. Follow up with PCP in 1-2 weeks 2. Please obtain BMP/CBC in one week 3. Please follow up on the following pending results: None  Home Health: No Equipment/Devices: None Discharge Condition: Stable CODE STATUS: Full Diet recommendation: Heart Healthy / Carb Modified   Brief/Interim Summary: Terry Arroyo a 59 y.o.femalewith medical history significant of DM2, COPD, CAD s/p CABG, depression, OSA, HTN, sciatica, Hx of Stroke, HLD,brain tumor status post resection and left eye blindness,Presented withgeneralized edema and feeling somewhat lightheaded.  Has an history of recent admission in February for hypertensive urgency and nonspecific chest pain.  Myoview done during that time shows newly decreased EF to 30%.  Prior echo done in December with normal EF and diastolic dysfunction.  Repeat echogram with EF of 30 to 35% and some wall motion abnormalities consistent with recent MI.  Cardiology was consulted and they will do further ischemic work-up as an outpatient once renal function improves.  Patient was initially diuresed with IV Lasix.  Which was held for couple of days due to worsening renal function.  Renal function remained stable despite stopping Lasix and giving her some gentle fluid.  Nephrology was consulted.  Renal ultrasound with chronic kidney disease and some hydronephrosis, she underwent CT renal stone studies which was negative for nephrolithiasis or hydronephrosis.  Nephrology was consulted and her AKI with CKD stage IIIb was thought to be due to getting some IV contrast exposure along with acute cardiorenal syndrome.  She did received IV contrast for CTA on admission which was negative for PE.  She did had left lower  extremity edema and calf tenderness.  Venous Doppler studies were negative for DVT.  She was discharged on p.o. Lasix 40 mg instead of 20.  She will also asked to restrict her fluid intake and she will follow up closely with her cardiologist and nephrologist.  Her home dose of metoprolol was increased from 25 to 50 mg twice daily. Electrolyte abnormalities were monitored very closely and repleted as necessary.  She has uncontrolled diabetes with A1c of 10.2.  Which is improvement from her prior A1c of 14.  She will continue with her current home regimen and follow-up with primary care physician for further management.  Discharge Diagnoses:  Active Problems:   Nonspecific chest pain   Hypertensive urgency   DM2 (diabetes mellitus, type 2) (HCC)   CKD (chronic kidney disease), stage III   Hyponatremia   Anasarca   Proteinuria   Hypoalbuminemia   Elevated troponin   Acute on chronic systolic CHF (congestive heart failure) (HCC)   Acute on chronic heart failure (HCC)   Leg edema  Discharge Instructions  Discharge Instructions    (HEART FAILURE PATIENTS) Call MD:  Anytime you have any of the following symptoms: 1) 3 pound weight gain in 24 hours or 5 pounds in 1 week 2) shortness of breath, with or without a dry hacking cough 3) swelling in the hands, feet or stomach 4) if you have to sleep on extra pillows at night in order to breathe.   Complete by: As directed    Diet - low sodium heart healthy   Complete by: As directed    Discharge instructions   Complete by: As directed    It was pleasure taking care of you.  We have make some changes to your medications, increase the dose of Lasix and metoprolol. Please restrict your fluid to 1.5 L daily. Please follow-up with your cardiologist and nephrologist for further management.   Increase activity slowly   Complete by: As directed      Allergies as of 03/02/2020      Reactions   Hydralazine Shortness Of Breath, Swelling   Body aches    Shellfish Allergy Anaphylaxis   Shrimp/lobster  Shrimp/lobster    Kiwi Extract Swelling   Metformin Other (See Comments)   Other reaction(s): GI Intolerance      Medication List    STOP taking these medications   metoprolol tartrate 25 MG tablet Commonly known as: LOPRESSOR     TAKE these medications   amLODipine 10 MG tablet Commonly known as: NORVASC Take 1 tablet (10 mg total) by mouth daily. Start taking on: March 03, 2020 What changed:   medication strength  how much to take  when to take this   aspirin 81 MG EC tablet Take 1 tablet (81 mg total) by mouth daily.   atorvastatin 80 MG tablet Commonly known as: LIPITOR Take 1 tablet (80 mg total) by mouth daily at 6 PM. What changed: when to take this   Fluticasone-Salmeterol 250-50 MCG/DOSE Aepb Commonly known as: ADVAIR Inhale 1 puff into the lungs 2 (two) times daily.   furosemide 40 MG tablet Commonly known as: LASIX Take 1 tablet (40 mg total) by mouth daily. What changed:   medication strength  how much to take  when to take this  reasons to take this   insulin aspart protamine- aspart (70-30) 100 UNIT/ML injection Commonly known as: NOVOLOG MIX 70/30 Inject 0.4 mLs (40 Units total) into the skin 2 (two) times daily with a meal. What changed: when to take this   isosorbide mononitrate 60 MG 24 hr tablet Commonly known as: IMDUR Take 1 tablet (60 mg total) by mouth daily. What changed: how much to take   lisinopril 30 MG tablet Commonly known as: ZESTRIL Take 30 mg by mouth daily.   methocarbamol 500 MG tablet Commonly known as: ROBAXIN Take 1 tablet (500 mg total) by mouth every 8 (eight) hours as needed for muscle spasms.   metoprolol succinate 50 MG 24 hr tablet Commonly known as: TOPROL-XL Take 1 tablet (50 mg total) by mouth 2 (two) times daily. Take with or immediately following a meal.   mirtazapine 30 MG tablet Commonly known as: REMERON Take 1 tablet (30 mg total) by mouth  at bedtime.   topiramate 25 MG capsule Commonly known as: TOPAMAX Take 25 mg by mouth daily.   valACYclovir 500 MG tablet Commonly known as: VALTREX Take 1 tablet by mouth 2 (two) times daily.      Follow-up Information    [facility] Follow up on 03/06/2020.   Specialty: Cardiology Why: at [facility] through the [facility] entrance Contact information: [address]         Allergies  Allergen Reactions  . Hydralazine Shortness Of Breath and Swelling    Body aches  . Shellfish Allergy Anaphylaxis    Shrimp/lobster  Shrimp/lobster    . Kiwi Extract Swelling  . Metformin Other (See Comments)    Other reaction(s): GI Intolerance    Consultations:  Cardiology  Nephrology  Procedures/Studies: DG Chest 2 View  Result Date: 02/26/2020 CLINICAL DATA:  Chest pain. EXAM:  CHEST - 2 VIEW COMPARISON:  November 23, 2019 FINDINGS: Stable mild cardiomegaly. The hila and mediastinum are normal. No pneumothorax. No nodules or masses. No focal infiltrates or overt edema. Mild pulmonary venous congestion suggested. IMPRESSION: Possible mild pulmonary venous congestion. No other acute abnormalities. Electronically Signed   By: Dorise Bullion III M.D   On: 02/26/2020 13:58   CT ANGIO CHEST PE W OR WO CONTRAST  Result Date: 02/26/2020 CLINICAL DATA:  59 year old female with increased lower extremity swelling, shortness of breath, flank pain. EXAM: CT ANGIOGRAPHY CHEST WITH CONTRAST TECHNIQUE: Multidetector CT imaging of the chest was performed using the standard protocol during bolus administration of intravenous contrast. Multiplanar CT image reconstructions and MIPs were obtained to evaluate the vascular anatomy. CONTRAST:  36mL OMNIPAQUE IOHEXOL 350 MG/ML SOLN COMPARISON:  CTA chest 01/22/2020. FINDINGS: Cardiovascular: Good contrast bolus timing in the pulmonary arterial tree.  No focal filling defect identified in the pulmonary arteries to suggest acute pulmonary embolism. Sequelae of CABG. Little contrast in the aorta today. Stable cardiomegaly. No pericardial effusion. Mediastinum/Nodes: No mediastinal lymphadenopathy. Lungs/Pleura: No convincing pleural fluid. Major airways are patent. Improved lung volumes from last month. No evidence of pulmonary edema. Mild lung base scarring or atelectasis. Upper Abdomen: Negative visible liver, spleen, adrenal glands, pancreas and stomach. Minimal visualization of the left renal upper pole only. Musculoskeletal: Prior sternotomy. T1 spina bifida occulta (normal variant). No acute osseous abnormality identified. Review of the MIP images confirms the above findings. IMPRESSION: 1. Negative for acute pulmonary embolus. 2. Cardiomegaly.  Prior CABG. 3. No acute findings in the chest. Improved lung volumes from last month with mild lung base scarring or atelectasis. Electronically Signed   By: Genevie Ann M.D.   On: 02/26/2020 20:51   US RENAL  Result Date: 02/26/2020 CLINICAL DATA:  59 year old female with flank pain. EXAM: RENAL / URINARY TRACT ULTRASOUND COMPLETE COMPARISON:  CTA chest today and 01/22/2020. FINDINGS: Right Kidney: Renal measurements: 11.1 x 5.3 x 6.9 cm = volume: 214 mL. Mild persistent fetal lobulations of the right kidney. Cortical echogenicity within normal limits. There is some prominence of the intrarenal collecting system (image 7), but no overt hydronephrosis. Possible nephrolithiasis at the midpole on image 9. Left Kidney: Renal measurements: 11.8 x 4.7 x 3.6 cm = volume: 106 mL. Similar lobulations to the right kidney. Cortical echogenicity within normal limits. Suggestion of mild hydronephrosis (image 24 and 25). No definite left renal lesion. Bladder: Diminutive, which probably explains the appearance of mild bladder wall thickening (image 51). Other: None. IMPRESSION: Questionable renal hydronephrosis greater on the left.  Possible right nephrolithiasis. If there is hematuria and/or acute renal insufficiency then follow-up noncontrast CT Abdomen and Pelvis is recommended. Otherwise a routine follow-up CT Urogram (with contrast) would be most valuable. Electronically Signed   By: Genevie Ann M.D.   On: 02/26/2020 20:48   US Venous Img Lower Bilateral (DVT)  Result Date: 03/01/2020 CLINICAL DATA:  59 year old with bilateral leg pain and calf tenderness. EXAM: BILATERAL LOWER EXTREMITY VENOUS DOPPLER ULTRASOUND TECHNIQUE: Gray-scale sonography with graded compression, as well as color Doppler and duplex ultrasound were performed to evaluate the lower extremity deep venous systems from the level of the common femoral vein and including the common femoral, femoral, profunda femoral, popliteal and calf veins including the posterior tibial, peroneal and gastrocnemius veins when visible. The superficial great saphenous vein was also interrogated. Spectral Doppler was utilized to evaluate flow at rest and with distal augmentation maneuvers in the common femoral,  femoral and popliteal veins. COMPARISON:  None. FINDINGS: RIGHT LOWER EXTREMITY Common Femoral Vein: No evidence of thrombus. Normal compressibility, respiratory phasicity and response to augmentation. Saphenofemoral Junction: No evidence of thrombus. Normal compressibility and flow on color Doppler imaging. Profunda Femoral Vein: No evidence of thrombus. Normal compressibility and flow on color Doppler imaging. Femoral Vein: No evidence of thrombus. Normal compressibility, respiratory phasicity and response to augmentation. Popliteal Vein: No evidence of thrombus. Normal compressibility, respiratory phasicity and response to augmentation. Calf Veins: No evidence of thrombus. Normal compressibility and flow on color Doppler imaging. LEFT LOWER EXTREMITY Common Femoral Vein: No evidence of thrombus. Normal compressibility, respiratory phasicity and response to augmentation.  Saphenofemoral Junction: No evidence of thrombus. Normal compressibility and flow on color Doppler imaging. Profunda Femoral Vein: No evidence of thrombus. Normal compressibility and flow on color Doppler imaging. Femoral Vein: No evidence of thrombus. Normal compressibility, respiratory phasicity and response to augmentation. Popliteal Vein: No evidence of thrombus. Normal compressibility, respiratory phasicity and response to augmentation. Calf Veins: No evidence of thrombus. Normal compressibility and flow on color Doppler imaging. IMPRESSION: No evidence of deep venous thrombosis in either lower extremity. Electronically Signed   By: Markus Daft M.D.   On: 03/01/2020 14:17   US Venous Img Lower Bilateral (DVT)  Result Date: 02/26/2020 CLINICAL DATA:  Bilateral leg swelling EXAM: BILATERAL LOWER EXTREMITY VENOUS DOPPLER ULTRASOUND TECHNIQUE: Gray-scale sonography with graded compression, as well as color Doppler and duplex ultrasound were performed to evaluate the lower extremity deep venous systems from the level of the common femoral vein and including the common femoral, femoral, profunda femoral, popliteal and calf veins including the posterior tibial, peroneal and gastrocnemius veins when visible. The superficial great saphenous vein was also interrogated. Spectral Doppler was utilized to evaluate flow at rest and with distal augmentation maneuvers in the common femoral, femoral and popliteal veins. COMPARISON:  None. FINDINGS: RIGHT LOWER EXTREMITY Common Femoral Vein: No evidence of thrombus. Normal compressibility, respiratory phasicity and response to augmentation. Saphenofemoral Junction: No evidence of thrombus. Normal compressibility and flow on color Doppler imaging. Profunda Femoral Vein: No evidence of thrombus. Normal compressibility and flow on color Doppler imaging. Femoral Vein: No evidence of thrombus. Normal compressibility, respiratory phasicity and response to augmentation. Popliteal  Vein: No evidence of thrombus. Normal compressibility, respiratory phasicity and response to augmentation. Calf Veins: No evidence of thrombus. Normal compressibility and flow on color Doppler imaging. Superficial Great Saphenous Vein: No evidence of thrombus. Normal compressibility. Venous Reflux:  None. Other Findings:  None. LEFT LOWER EXTREMITY Common Femoral Vein: No evidence of thrombus. Normal compressibility, respiratory phasicity and response to augmentation. Saphenofemoral Junction: No evidence of thrombus. Normal compressibility and flow on color Doppler imaging. Profunda Femoral Vein: No evidence of thrombus. Normal compressibility and flow on color Doppler imaging. Femoral Vein: No evidence of thrombus. Normal compressibility, respiratory phasicity and response to augmentation. Popliteal Vein: No evidence of thrombus. Normal compressibility, respiratory phasicity and response to augmentation. Calf Veins: No evidence of thrombus. Normal compressibility and flow on color Doppler imaging. Superficial Great Saphenous Vein: No evidence of thrombus. Normal compressibility. Venous Reflux:  None. Other Findings:  None. IMPRESSION: No evidence of deep venous thrombosis in either lower extremity. Electronically Signed   By: Constance Holster M.D.   On: 02/26/2020 23:42   ECHOCARDIOGRAM COMPLETE  Result Date: 02/27/2020    ECHOCARDIOGRAM REPORT   Patient Name:   RIVERS GASSMANN Date of Exam: 02/27/2020 Medical Rec #:  [id number]  Height:       66.0 in Accession #:    [id number]      Weight:       178.8 lb Date of Birth:  06-26-1961        BSA:          1.907 m Patient Age:    59 years        BP:           143/74 mmHg Patient Gender: F               HR:           68 bpm. Exam Location:  [facility] Procedure: 2D Echo, Color Doppler, Cardiac Doppler and Intracardiac            Opacification Agent Indications:     F81.82 CHF-Acute Systolic  History:         Patient has prior history of Echocardiogram examinations.                   Previous Myocardial Infarction, Prior CABG; Risk                  Factors:Hypertension, Diabetes and Dyslipidemia. Pt tested                  positive for COVID-19 02/2019.  Sonographer:     Charmayne Sheer RDCS (AE) Referring Phys:  [facility] Diagnosing Phys: Isaias Cowman MD IMPRESSIONS  1. Left ventricular ejection fraction, by estimation, is 30 to 35%. The left ventricle has moderately decreased function. The left ventricle has no regional wall motion abnormalities. Left ventricular diastolic parameters were normal.  2. Right ventricular systolic function is normal. The right ventricular size is normal.  3. The mitral valve is normal in structure. Mild mitral valve regurgitation. No evidence of mitral stenosis.  4. The aortic valve is normal in structure. Aortic valve regurgitation is not visualized. No aortic stenosis is present.  5. The inferior vena cava is normal in size with greater than 50% respiratory variability, suggesting right atrial pressure of 3 mmHg. FINDINGS  Left Ventricle: Left ventricular ejection fraction, by estimation, is 30 to 35%. The left ventricle has moderately decreased function. The left ventricle has no regional wall motion abnormalities. Definity contrast agent was given IV to delineate the left ventricular endocardial borders. The left ventricular internal cavity size was normal in size. There is no left ventricular hypertrophy. Left ventricular diastolic parameters were normal. Right Ventricle: The right ventricular size is normal. No increase in right ventricular wall thickness. Right ventricular systolic function is normal. Left Atrium: Left atrial size was normal in size. Right Atrium: Right atrial size was normal in size. Pericardium: There is no evidence of pericardial effusion. Mitral Valve: The mitral valve is normal in structure. Normal mobility of the mitral valve leaflets. Mild mitral valve regurgitation. No evidence of mitral valve stenosis.  MV peak gradient, 6.0 mmHg. The mean mitral valve gradient is 2.0 mmHg. Tricuspid Valve: The tricuspid valve is normal in structure. Tricuspid valve regurgitation is mild . No evidence of tricuspid stenosis. Aortic Valve: The aortic valve is normal in structure. Aortic valve regurgitation is not visualized. No aortic stenosis is present. Aortic valve mean gradient measures 4.0 mmHg. Aortic valve peak gradient measures 11.2 mmHg. Aortic valve area, by VTI measures 2.18 cm. Pulmonic Valve: The pulmonic valve was normal in structure. Pulmonic valve regurgitation is not visualized. No evidence of pulmonic stenosis. Aorta: The aortic root is normal  in size and structure. Venous: The inferior vena cava is normal in size with greater than 50% respiratory variability, suggesting right atrial pressure of 3 mmHg. IAS/Shunts: No atrial level shunt detected by color flow Doppler.  LEFT VENTRICLE PLAX 2D LVIDd:         5.83 cm      Diastology LVIDs:         5.00 cm      LV e' lateral:   3.48 cm/s LV PW:         0.79 cm      LV E/e' lateral: 31.3 LV IVS:        0.84 cm      LV e' medial:    3.05 cm/s LVOT diam:     1.80 cm      LV E/e' medial:  35.7 LV SV:         62 LV SV Index:   33 LVOT Area:     2.54 cm  LV Volumes (MOD) LV vol d, MOD A2C: 97.5 ml LV vol d, MOD A4C: 129.0 ml LV vol s, MOD A2C: 51.8 ml LV vol s, MOD A4C: 80.5 ml LV SV MOD A2C:     45.7 ml LV SV MOD A4C:     129.0 ml LV SV MOD BP:      47.9 ml LEFT ATRIUM             Index LA diam:        3.10 cm 1.63 cm/m LA Vol (A2C):   39.4 ml 20.66 ml/m LA Vol (A4C):   54.2 ml 28.42 ml/m LA Biplane Vol: 48.2 ml 25.28 ml/m  AORTIC VALVE                   PULMONIC VALVE AV Area (Vmax):    1.50 cm    PV Vmax:       1.54 m/s AV Area (Vmean):   1.72 cm    PV Vmean:      98.800 cm/s AV Area (VTI):     2.18 cm    PV VTI:        0.302 m AV Vmax:           167.00 cm/s PV Peak grad:  9.5 mmHg AV Vmean:          95.700 cm/s PV Mean grad:  4.0 mmHg AV VTI:            0.285 m  AV Peak Grad:      11.2 mmHg AV Mean Grad:      4.0 mmHg LVOT Vmax:         98.30 cm/s LVOT Vmean:        64.700 cm/s LVOT VTI:          0.244 m LVOT/AV VTI ratio: 0.86  AORTA Ao Root diam: 3.10 cm MITRAL VALVE MV Area (PHT): 5.27 cm     SHUNTS MV Peak grad:  6.0 mmHg     Systemic VTI:  0.24 m MV Mean grad:  2.0 mmHg     Systemic Diam: 1.80 cm MV Vmax:       1.22 m/s MV Vmean:      67.8 cm/s MV Decel Time: 144 msec MV E velocity: 109.00 cm/s MV A velocity: 111.00 cm/s MV E/A ratio:  0.98 Isaias Cowman MD Electronically signed by Isaias Cowman MD Signature Date/Time: 02/27/2020/1:49:37 PM    Final    CT RENAL STONE STUDY  Result Date:  03/01/2020 CLINICAL DATA:  Left-sided abdominal/pelvic pain beginning this morning. EXAM: CT ABDOMEN AND PELVIS WITHOUT CONTRAST TECHNIQUE: Multidetector CT imaging of the abdomen and pelvis was performed following the standard protocol without IV contrast. COMPARISON:  Renal ultrasound, 02/26/2020 FINDINGS: Lower chest: Mild cardiomegaly.  No acute findings. Hepatobiliary: No focal liver abnormality is seen. No gallstones, gallbladder wall thickening, or biliary dilatation. Pancreas: Unremarkable. No pancreatic ductal dilatation or surrounding inflammatory changes. Spleen: Normal in size without focal abnormality. Adrenals/Urinary Tract: No adrenal masses. Left renal cortical thinning with more focal areas of cortical scarring. No renal masses. No collecting system stones. No hydronephrosis. Normal ureters. Bladder is unremarkable. Stomach/Bowel: Normal stomach. Small bowel and colon are normal in caliber. No wall thickening. No inflammation. Mild increased colonic stool burden diffusely. Normal appendix visualized. Vascular/Lymphatic: Dense aortic atherosclerosis. No aneurysm. Prominent retroperitoneal lymph nodes at the level of the kidneys, all subcentimeter in short axis. Reproductive: Uterus and bilateral adnexa are unremarkable. Other: No abdominal wall hernia or  abnormality. No abdominopelvic ascites. Musculoskeletal: No fracture or acute finding. No osteoblastic or osteolytic lesions. IMPRESSION: 1. No acute findings within the abdomen or pelvis. 2. No renal or ureteral stones or hydronephrosis. 3. Mild increased colonic stool burden diffusely. No bowel obstruction or inflammation. 4. Aortic atherosclerosis. Electronically Signed   By: Lajean Manes M.D.   On: 03/01/2020 13:54     Subjective: Patient was feeling better when seen today.  She continued to experience some exertional dyspnea and still cannot lay completely flat but states that it is much improved than before.  Discharge Exam: Vitals:   03/02/20 0723 03/02/20 1111  BP: (!) 152/76 (!) 146/70  Pulse: (!) 57 64  Resp:    Temp: 98.5 F (36.9 C) 97.9 F (36.6 C)  SpO2: 100% 100%   Vitals:   03/01/20 2018 03/02/20 0422 03/02/20 0723 03/02/20 1111  BP: (!) 147/92 (!) 147/71 (!) 152/76 (!) 146/70  Pulse: 61 (!) 57 (!) 57 64  Resp: 20 20    Temp: 98.7 F (37.1 C) 97.9 F (36.6 C) 98.5 F (36.9 C) 97.9 F (36.6 C)  TempSrc: Oral Oral Oral Oral  SpO2: 100% 99% 100% 100%  Weight:  83.6 kg    Height:        General: Pt is alert, awake, not in acute distress Cardiovascular: RRR, S1/S2 +, no rubs, no gallops Respiratory: CTA bilaterally, no wheezing, no rhonchi Abdominal: Soft, NT, ND, bowel sounds + Extremities: 2+ LE edema, no cyanosis   The results of significant diagnostics from this hospitalization (including imaging, microbiology, ancillary and laboratory) are listed below for reference.    Microbiology: Recent Results (from the past 240 hour(s))  SARS CORONAVIRUS 2 (TAT 6-24 HRS) Nasopharyngeal Nasopharyngeal Swab     Status: None   Collection Time: 02/26/20  6:04 PM   Specimen: Nasopharyngeal Swab  Result Value Ref Range Status   SARS Coronavirus 2 NEGATIVE NEGATIVE Final    Comment: (NOTE) SARS-CoV-2 target nucleic acids are NOT DETECTED. The SARS-CoV-2 RNA is  generally detectable in upper and lower respiratory specimens during the acute phase of infection. Negative results do not preclude SARS-CoV-2 infection, do not rule out co-infections with other pathogens, and should not be used as the sole basis for treatment or other patient management decisions. Negative results must be combined with clinical observations, patient history, and epidemiological information. The expected result is Negative. Fact Sheet for Patients: SugarRoll.be Fact Sheet for Healthcare Providers: https://www.woods-mathews.com/ This test is not yet approved or  cleared by the [place] and  has been authorized for detection and/or diagnosis of SARS-CoV-2 by FDA under an Emergency Use Authorization (EUA). This EUA will remain  in effect (meaning this test can be used) for the duration of the COVID-19 declaration under Section 56 4(b)(1) of the Act, 21 U.S.C. section 360bbb-3(b)(1), unless the authorization is terminated or revoked sooner. Performed at [facility], [place] [address], [place], [place] [address]      Labs: BNP (last 3 results) Recent Labs    02/26/20 1302  BNP 623.7*   Basic Metabolic Panel: Recent Labs  Lab 02/26/20 1302 02/26/20 1609 02/27/20 0101 02/27/20 0101 02/28/20 0301 02/29/20 0650 02/29/20 1334 03/01/20 0818 03/02/20 0430  NA   < >  --  142   < > 140 137 135 139 139  K   < >  --  2.9*   < > 2.9* 4.3 4.4 4.2 4.2  CL   < >  --  109   < > 104 107 107 108 104  CO2   < >  --  26   < > 27 20* 22 22 23   GLUCOSE   < >  --  239*   < > 151* 214* 253* 163* 221*  BUN   < >  --  17   < > 25* 30* 30* 30* 36*  CREATININE   < >  --  1.69*   < > 2.26* 2.38* 2.11* 2.16* 2.21*  CALCIUM   < >  --  8.1*   < > 7.9* 8.3* 8.1* 8.3* 8.8*  MG  --  1.7 1.6*  --  2.0  --   --   --   --   PHOS  --  4.2 4.4  --   --   --   --   --  3.9   < > = values in this interval not displayed.   Liver Function  Tests: Recent Labs  Lab 02/26/20 1302 02/27/20 0101 03/02/20 0430  AST 16 13*  --   ALT 11 10  --   ALKPHOS 64 70  --   BILITOT 0.5 0.8  --   PROT 6.5 6.2*  --   ALBUMIN 2.4* 2.3* 2.6*   No results for input(s): LIPASE, AMYLASE in the last 168 hours. No results for input(s): AMMONIA in the last 168 hours. CBC: Recent Labs  Lab 02/26/20 1302 02/27/20 0101  WBC 9.4 10.2  HGB 12.1 11.7*  HCT 36.7 35.1*  MCV 85.9 86.7  PLT 369 372   Cardiac Enzymes: No results for input(s): CKTOTAL, CKMB, CKMBINDEX, TROPONINI in the last 168 hours. BNP: Invalid input(s): POCBNP CBG: Recent Labs  Lab 03/01/20 1246 03/01/20 1649 03/01/20 2114 03/02/20 0725 03/02/20 1112  GLUCAP 184* 140* 170* 183* 273*   D-Dimer No results for input(s): DDIMER in the last 72 hours. Hgb A1c No results for input(s): HGBA1C in the last 72 hours. Lipid Profile No results for input(s): CHOL, HDL, LDLCALC, TRIG, CHOLHDL, LDLDIRECT in the last 72 hours. Thyroid function studies No results for input(s): TSH, T4TOTAL, T3FREE, THYROIDAB in the last 72 hours.  Invalid input(s): FREET3 Anemia work up No results for input(s): VITAMINB12, FOLATE, FERRITIN, TIBC, IRON, RETICCTPCT in the last 72 hours. Urinalysis    Component Value Date/Time   COLORURINE STRAW (A) 02/26/2020 1620   APPEARANCEUR CLEAR (A) 02/26/2020 1620   LABSPEC 1.004 (L) 02/26/2020 1620   PHURINE 7.0 02/26/2020 1620  GLUCOSEU 150 (A) 02/26/2020 1620   HGBUR NEGATIVE 02/26/2020 1620   BILIRUBINUR NEGATIVE 02/26/2020 1620   KETONESUR NEGATIVE 02/26/2020 1620   PROTEINUR >=300 (A) 02/26/2020 1620   NITRITE NEGATIVE 02/26/2020 1620   LEUKOCYTESUR NEGATIVE 02/26/2020 1620   Sepsis Labs Invalid input(s): PROCALCITONIN,  WBC,  LACTICIDVEN Microbiology Recent Results (from the past 240 hour(s))  SARS CORONAVIRUS 2 (TAT 6-24 HRS) Nasopharyngeal Nasopharyngeal Swab     Status: None   Collection Time: 02/26/20  6:04 PM   Specimen:  Nasopharyngeal Swab  Result Value Ref Range Status   SARS Coronavirus 2 NEGATIVE NEGATIVE Final    Comment: (NOTE) SARS-CoV-2 target nucleic acids are NOT DETECTED. The SARS-CoV-2 RNA is generally detectable in upper and lower respiratory specimens during the acute phase of infection. Negative results do not preclude SARS-CoV-2 infection, do not rule out co-infections with other pathogens, and should not be used as the sole basis for treatment or other patient management decisions. Negative results must be combined with clinical observations, patient history, and epidemiological information. The expected result is Negative. Fact Sheet for Patients: SugarRoll.be Fact Sheet for Healthcare Providers: https://www.woods-mathews.com/ This test is not yet approved or cleared by the [place] FDA and  has been authorized for detection and/or diagnosis of SARS-CoV-2 by FDA under an Emergency Use Authorization (EUA). This EUA will remain  in effect (meaning this test can be used) for the duration of the COVID-19 declaration under Section 56 4(b)(1) of the Act, 21 U.S.C. section 360bbb-3(b)(1), unless the authorization is terminated or revoked sooner. Performed at [facility], [place] [address], [place], [place] [address]     Time coordinating discharge: Over 30 minutes  SIGNED:  Lorella Nimrod, MD  [facility] 03/02/2020, 11:19 AM  If 7PM-7AM, please contact night-coverage [email or web address]  This record has been created using Systems analyst. Errors have been sought and corrected,but may not always be located. Such creation errors do not reflect on the standard of care.

## 2020-03-02 NOTE — Progress Notes (Signed)
Written and verbal discharge instructions discussed with pt. Discussed medication, HF education, follow-up appt and when to call MD or return to ER. See DC papers for details. IV and tele dc'd. Belongings with pt. Pt to car via wheelchair by RN where family was waiting.

## 2020-03-03 ENCOUNTER — Telehealth: Payer: Self-pay | Admitting: Family

## 2020-03-03 NOTE — Telephone Encounter (Signed)
Spoke with patient and confirmed her new patient appt with [place] for 3/18 in the CHF Clinic. Patient said she is doing well since she was discharged with no complaints or symptoms except some swelling but is getting better. She is following a low sodium diet, taking her medications and checking her weight daily since she got home.   Alyse Low, [place]

## 2020-03-04 LAB — PROTEIN ELECTRO, RANDOM URINE
Albumin ELP, Urine: 57.4 %
Alpha-1-Globulin, U: 7.9 %
Alpha-2-Globulin, U: 8.9 %
Beta Globulin, U: 12.4 %
Gamma Globulin, U: 13.5 %
Total Protein, Urine: 450.8 mg/dL

## 2020-03-05 LAB — PROTEIN ELECTROPHORESIS, SERUM
A/G Ratio: 0.6 — ABNORMAL LOW (ref 0.7–1.7)
Albumin ELP: 2.2 g/dL — ABNORMAL LOW (ref 2.9–4.4)
Alpha-1-Globulin: 0.2 g/dL (ref 0.0–0.4)
Alpha-2-Globulin: 1.4 g/dL — ABNORMAL HIGH (ref 0.4–1.0)
Beta Globulin: 1 g/dL (ref 0.7–1.3)
Gamma Globulin: 0.9 g/dL (ref 0.4–1.8)
Globulin, Total: 3.5 g/dL (ref 2.2–3.9)
Total Protein ELP: 5.7 g/dL — ABNORMAL LOW (ref 6.0–8.5)

## 2020-03-06 ENCOUNTER — Other Ambulatory Visit: Payer: Self-pay

## 2020-03-06 ENCOUNTER — Encounter: Payer: Self-pay | Admitting: Family

## 2020-03-06 ENCOUNTER — Ambulatory Visit: Payer: Medicare HMO | Attending: Family | Admitting: Family

## 2020-03-06 VITALS — BP 160/86 | HR 70 | Resp 18 | Ht 66.0 in | Wt 179.2 lb

## 2020-03-06 DIAGNOSIS — E119 Type 2 diabetes mellitus without complications: Secondary | ICD-10-CM | POA: Diagnosis not present

## 2020-03-06 DIAGNOSIS — Z7982 Long term (current) use of aspirin: Secondary | ICD-10-CM | POA: Insufficient documentation

## 2020-03-06 DIAGNOSIS — I252 Old myocardial infarction: Secondary | ICD-10-CM | POA: Diagnosis not present

## 2020-03-06 DIAGNOSIS — Z79899 Other long term (current) drug therapy: Secondary | ICD-10-CM | POA: Insufficient documentation

## 2020-03-06 DIAGNOSIS — Z8249 Family history of ischemic heart disease and other diseases of the circulatory system: Secondary | ICD-10-CM | POA: Insufficient documentation

## 2020-03-06 DIAGNOSIS — R0602 Shortness of breath: Secondary | ICD-10-CM | POA: Insufficient documentation

## 2020-03-06 DIAGNOSIS — N184 Chronic kidney disease, stage 4 (severe): Secondary | ICD-10-CM

## 2020-03-06 DIAGNOSIS — R42 Dizziness and giddiness: Secondary | ICD-10-CM | POA: Insufficient documentation

## 2020-03-06 DIAGNOSIS — M549 Dorsalgia, unspecified: Secondary | ICD-10-CM | POA: Diagnosis not present

## 2020-03-06 DIAGNOSIS — R682 Dry mouth, unspecified: Secondary | ICD-10-CM | POA: Diagnosis not present

## 2020-03-06 DIAGNOSIS — Z794 Long term (current) use of insulin: Secondary | ICD-10-CM

## 2020-03-06 DIAGNOSIS — I890 Lymphedema, not elsewhere classified: Secondary | ICD-10-CM | POA: Diagnosis not present

## 2020-03-06 DIAGNOSIS — Z8616 Personal history of COVID-19: Secondary | ICD-10-CM | POA: Insufficient documentation

## 2020-03-06 DIAGNOSIS — I110 Hypertensive heart disease with heart failure: Secondary | ICD-10-CM | POA: Diagnosis not present

## 2020-03-06 DIAGNOSIS — Z87891 Personal history of nicotine dependence: Secondary | ICD-10-CM | POA: Diagnosis not present

## 2020-03-06 DIAGNOSIS — I10 Essential (primary) hypertension: Secondary | ICD-10-CM

## 2020-03-06 DIAGNOSIS — E785 Hyperlipidemia, unspecified: Secondary | ICD-10-CM | POA: Insufficient documentation

## 2020-03-06 DIAGNOSIS — I5022 Chronic systolic (congestive) heart failure: Secondary | ICD-10-CM | POA: Diagnosis not present

## 2020-03-06 DIAGNOSIS — E1122 Type 2 diabetes mellitus with diabetic chronic kidney disease: Secondary | ICD-10-CM

## 2020-03-06 DIAGNOSIS — H5462 Unqualified visual loss, left eye, normal vision right eye: Secondary | ICD-10-CM | POA: Diagnosis not present

## 2020-03-06 DIAGNOSIS — F419 Anxiety disorder, unspecified: Secondary | ICD-10-CM | POA: Diagnosis not present

## 2020-03-06 DIAGNOSIS — R5383 Other fatigue: Secondary | ICD-10-CM | POA: Insufficient documentation

## 2020-03-06 LAB — KAPPA/LAMBDA LIGHT CHAINS

## 2020-03-06 NOTE — Progress Notes (Signed)
Patient ID: Terry Arroyo, female    DOB: Mar 11, 1961, 59 y.o.   MRN: [id number]  HPI  Terry Arroyo is a 59 y/o female with a history of DM, hyperlipidemia, HTN, brain tumor, left eye blindness, previous tobacco use and chronic heart failure.   Echo report from 02/27/20 reviewed and showed an EF of 30-35% along with mild MR.   Admitted 02/26/20 due to HTN and HF exacerbation. Cardiology and nephrology consults obtained. Initially needed IV lasix but then held due to worsening renal function and then transitioned to oral diuretics. Negative for DVT. Discharged after 5 days.   She presents today for her initial visit with a chief complaint of moderate shortness of breath upon minimal exertion. She describes this as chronic in nature having been present for a couple of years. She has associated fatigue, pedal edema, light-headedness, anxiety and back pain along with this. She denies any difficulty sleeping, abdominal distention, palpitations, chest pain, cough or weight gain.   She says that she drinks ~ 1/2 a case (12 bottles) of water every day as her mouth gets "extremely dry" and is aware that she's drinking too much fluids.   She is tearful in the office because she relays the trauma that she went through when she says that she was diagnosed with COVID early in the pandemic. She says that she remembers being on a stretcher and being moved often into tents and hallways. She shares an experience of one time her roommate dying while in the bathroom. She has lost friends due to [place] as well.   Past Medical History:  Diagnosis Date  . Blind left eye   . Brain tumor ([place]) 1986  . CHF (congestive heart failure) ([place])   . Diabetes mellitus without complication ([place])   . Hyperlipidemia   . Hypertension   . MI (myocardial infarction) ([place]) 2018   Past Surgical History:  Procedure Laterality Date  . BRAIN SURGERY    . CESAREAN SECTION     Family History  Problem Relation Age of Onset  . CAD Mother    . CAD Brother    Social History   Tobacco Use  . Smoking status: Former Research scientist (life sciences)  . Smokeless tobacco: Never Used  Substance Use Topics  . Alcohol use: Not Currently   Allergies  Allergen Reactions  . Hydralazine Shortness Of Breath and Swelling    Body aches  . Shellfish Allergy Anaphylaxis    Shrimp/lobster  Shrimp/lobster    . Kiwi Extract Swelling  . Metformin Other (See Comments)    Other reaction(s): GI Intolerance   Prior to Admission medications   Medication Sig Start Date End Date Taking? Authorizing Provider  amLODipine (NORVASC) 10 MG tablet Take 1 tablet (10 mg total) by mouth daily. 03/03/20  Yes Lorella Nimrod, MD  aspirin EC 81 MG EC tablet Take 1 tablet (81 mg total) by mouth daily. 11/26/19  Yes Lavina Hamman, MD  atorvastatin (LIPITOR) 80 MG tablet Take 1 tablet (80 mg total) by mouth daily at 6 PM. Patient taking differently: Take 80 mg by mouth daily.  11/26/19  Yes Lavina Hamman, MD  Fluticasone-Salmeterol (ADVAIR) 250-50 MCG/DOSE AEPB Inhale 1 puff into the lungs 2 (two) times daily.   Yes [provider]  furosemide (LASIX) 40 MG tablet Take 1 tablet (40 mg total) by mouth daily. 03/02/20  Yes Lorella Nimrod, MD  insulin aspart protamine- aspart (NOVOLOG MIX 70/30) (70-30) 100 UNIT/ML injection Inject 0.4 mLs (40 Units total)  into the skin 2 (two) times daily with a meal. Patient taking differently: Inject 40 Units into the skin 3 (three) times daily.  11/26/19  Yes Lavina Hamman, MD  isosorbide mononitrate (IMDUR) 30 MG 24 hr tablet Take 30 mg by mouth daily.   Yes [provider]  lisinopril (ZESTRIL) 30 MG tablet Take 30 mg by mouth daily.   Yes [provider]  methocarbamol (ROBAXIN) 500 MG tablet Take 1 tablet (500 mg total) by mouth every 8 (eight) hours as needed for muscle spasms. 11/26/19  Yes Lavina Hamman, MD  metoprolol succinate (TOPROL-XL) 50 MG 24 hr tablet Take 1 tablet (50 mg total) by mouth 2 (two) times daily.  Take with or immediately following a meal. 03/02/20  Yes Lorella Nimrod, MD  mirtazapine (REMERON) 30 MG tablet Take 1 tablet (30 mg total) by mouth at bedtime. 11/26/19  Yes Lavina Hamman, MD  mometasone-formoterol [facility]) 200-5 MCG/ACT AERO Inhale 2 puffs into the lungs 2 (two) times daily.   Yes [provider]  topiramate (TOPAMAX) 25 MG capsule Take 25 mg by mouth daily.   Yes [provider]  valACYclovir (VALTREX) 500 MG tablet Take 1 tablet by mouth 2 (two) times daily. 05/20/17  Yes [provider]     Review of Systems  Constitutional: Positive for fatigue (easily). Negative for appetite change.  HENT: Negative for congestion, postnasal drip and sore throat.   Eyes: Positive for visual disturbance (blind in left eye). Negative for pain.  Respiratory: Positive for shortness of breath (easily). Negative for cough.   Cardiovascular: Positive for leg swelling. Negative for chest pain.  Gastrointestinal: Negative for abdominal distention and abdominal pain.  Endocrine: Negative.   Genitourinary: Negative.   Musculoskeletal: Positive for back pain. Negative for neck pain.  Skin: Negative.   Allergic/Immunologic: Negative.   Neurological: Positive for light-headedness. Negative for dizziness.  Hematological: Negative for adenopathy. Does not bruise/bleed easily.  Psychiatric/Behavioral: Negative for dysphoric mood and sleep disturbance (sleeping on 1 pillow). The patient is not nervous/anxious.     Vitals:   03/06/20 0908  BP: (!) 160/86  Pulse: 70  Resp: 18  SpO2: 96%  Weight: 179 lb 3.2 oz (81.3 kg)  Height: 5\' 6"  (1.676 m)   Wt Readings from Last 3 Encounters:  03/06/20 179 lb 3.2 oz (81.3 kg)  03/02/20 184 lb 6.4 oz (83.6 kg)  01/22/20 174 lb 12.8 oz (79.3 kg)   Lab Results  Component Value Date   CREATININE 2.21 (H) 03/02/2020   CREATININE 2.16 (H) 03/01/2020   CREATININE 2.11 (H) 02/29/2020     Physical Exam Vitals and nursing note  reviewed.  Constitutional:      Appearance: Normal appearance.  HENT:     Head: Normocephalic and atraumatic.  Cardiovascular:     Rate and Rhythm: Normal rate and regular rhythm.  Pulmonary:     Effort: Pulmonary effort is normal. No respiratory distress.     Breath sounds: No wheezing or rales.  Abdominal:     General: There is no distension.     Palpations: Abdomen is soft.  Musculoskeletal:        General: No tenderness.     Cervical back: Normal range of motion and neck supple.     Right lower leg: Edema (1+ pitting) present.     Left lower leg: Edema (1+ pitting) present.  Skin:    General: Skin is warm and dry.  Neurological:     Mental  Status: She is alert and oriented to person, place, and time. Mental status is at baseline.  Psychiatric:        Mood and Affect: Mood is anxious. Affect is tearful.        Behavior: Behavior normal.        Thought Content: Thought content normal.     Assessment & Plan:  1: Chronic heart failure with reduced ejection fraction- - NYHA class III - euvolemic today - weighing daily and she says that her weight has been stable; reminded to call for an overnight weight gain of >2 pounds or a weekly weight gain of >5 pounds - not adding salt and has been trying to look at food labels; reviewed the importance of closely following a 2000mg  sodium diet and written dietary information along with a low sodium cookbook were given to the patient - explained that she was drinking too much fluids and that she needed to get her daily fluid intake down to 60-64 ounces of fluid daily; encouraged her to try sucking on sugar free candy to see if that helps with her dry mouth - had telemedicine visit with cardiology Barrington Ellison) 02/13/20 - will send in paramedicine referral and patient was agreeable to this - BNP 02/25/30 was 610.0  2: HTN- - BP mildly elevated today but she says that she's feeling anxious - saw PCP Ouida Sills) 02/18/20 - BMP from 03/02/20 reviewed  and showed sodium 139, potassium 4.2, creatinine 2.21 and GFR 28 - says that she sees nephrology 03/10/20  3: DM- - saw endocrinology Honor Junes) 01/17/20 - A1c 02/27/20 was 10.2% - glucose at home this morning was 152  4: Anxiety- - patient is quite tearful when talking about her health experience early in the pandemic and that she didn't even think she was in the [place] when being moved out into tents - emotional support offered and she was encouraged to follow-up with her PCP should she feel like she needs assistance with this  5: Lymphedema- - stage 2 - does have compression socks but hasn't been wearing them; instructed her to put them on every morning with removal at bedtime - elevate legs when sitting for long periods of time - limited in her ability to exercise due to her symptoms - consider lymphapress compression boots if edema persists   Medication bottles were reviewed.   Return in 6 weeks or sooner for any questions/problems before then.

## 2020-03-06 NOTE — Patient Instructions (Signed)
Continue weighing daily and call for an overnight weight gain of > 2 pounds or a weekly weight gain of >5 pounds. 

## 2020-03-10 ENCOUNTER — Telehealth (HOSPITAL_COMMUNITY): Payer: Self-pay

## 2020-03-10 ENCOUNTER — Other Ambulatory Visit: Payer: Self-pay

## 2020-03-10 DIAGNOSIS — N1832 Chronic kidney disease, stage 3b: Secondary | ICD-10-CM | POA: Insufficient documentation

## 2020-03-10 DIAGNOSIS — I129 Hypertensive chronic kidney disease with stage 1 through stage 4 chronic kidney disease, or unspecified chronic kidney disease: Secondary | ICD-10-CM

## 2020-03-10 DIAGNOSIS — N189 Chronic kidney disease, unspecified: Secondary | ICD-10-CM | POA: Insufficient documentation

## 2020-03-10 DIAGNOSIS — E871 Hypo-osmolality and hyponatremia: Secondary | ICD-10-CM | POA: Insufficient documentation

## 2020-03-10 DIAGNOSIS — D631 Anemia in chronic kidney disease: Secondary | ICD-10-CM | POA: Insufficient documentation

## 2020-03-10 HISTORY — DX: Hypertensive chronic kidney disease with stage 1 through stage 4 chronic kidney disease, or unspecified chronic kidney disease: I12.9

## 2020-03-10 NOTE — Telephone Encounter (Signed)
Contacted Beda to set up appt with her for a home visit to discuss her being part of [employer] program for Heart Failure.  Made appt for tomorrow around 11 am.  Will check medication bottles for her.  Will educate her on heart failure, diet and medication management.   Portal [phone]

## 2020-03-10 NOTE — Patient Outreach (Signed)
[facility]) Care Management  03/10/2020  Terry Arroyo Apr 18, 1961 [id number]    EMMI-General Discharge RED ON EMMI ALERT Day # 4 Date: 03/07/2020 Red Alert Reason: "Other questions/problems? Yes"   Outreach attempt # 1 to patient. Spoke with patient briefly as she reported she was waiting on her ride to get to MD appt. Patient states she uses Medicaid transportation and has MD appt at 10am today and agency is running late. She has spoken with someone from agency and was told driver on the way and agency has notified MD office that patient late due to transportation. Reviewed and addressed red alert with patient. She reports that her legs have been swollen. They were so prior to discharge and have improved slightly. She is taking diuretic and using compression stockings as advised. Patient reports her MD appt today with kidney MD is to follow up on this issue. She denies any RN CM needs or concerns at this time. Patient active with [facility] CM program. She has completed automated post discharge calls.     Plan: RN CM will close case at this time.   [name], RN,BSN,CCM [place] [facility] Telephonic Care Management Coordinator Direct Phone: [phone] Toll Free: [phone] Fax: [phone]

## 2020-03-11 ENCOUNTER — Encounter (HOSPITAL_COMMUNITY): Payer: Self-pay

## 2020-03-11 ENCOUNTER — Other Ambulatory Visit (HOSPITAL_COMMUNITY): Payer: Self-pay

## 2020-03-11 NOTE — Progress Notes (Signed)
Today had first home visit with Enid Derry.  Explained the program to her and she is wanting to be part of it.  Signed proper paperwork.  Discussed low sodium diet and fluids.  Verified her medications and they matched what was in epic.  She is aware of how and why she is taking these medications.  She denies any problems today, denies chest pain, shortness of breath, headaches or dizziness.  Very pleasant to talk with.  Lungs were clear and no edema in legs, she has some edema on top of feet.  Abdominal area does not feel tight.  She weighs daily and has working scale.  She is aware to weigh daily at same time every morning with same clothes on.  She is aware if 3 lbs weight gain over night or 5 lbs in a week to contact provider or me.  She appears to understand.  She is aware when to call 911 and when to call me or provider when she is not feeling well.  She is aware of up coming appts.  Will visit for heart failure and educate on heart failure.   [place] [phone]

## 2020-03-12 DIAGNOSIS — D631 Anemia in chronic kidney disease: Secondary | ICD-10-CM | POA: Diagnosis not present

## 2020-03-12 DIAGNOSIS — R809 Proteinuria, unspecified: Secondary | ICD-10-CM | POA: Diagnosis not present

## 2020-03-12 DIAGNOSIS — E871 Hypo-osmolality and hyponatremia: Secondary | ICD-10-CM | POA: Diagnosis not present

## 2020-03-12 DIAGNOSIS — I5022 Chronic systolic (congestive) heart failure: Secondary | ICD-10-CM | POA: Diagnosis not present

## 2020-03-12 DIAGNOSIS — E1122 Type 2 diabetes mellitus with diabetic chronic kidney disease: Secondary | ICD-10-CM | POA: Diagnosis not present

## 2020-03-12 DIAGNOSIS — I129 Hypertensive chronic kidney disease with stage 1 through stage 4 chronic kidney disease, or unspecified chronic kidney disease: Secondary | ICD-10-CM | POA: Diagnosis not present

## 2020-03-12 DIAGNOSIS — N1832 Chronic kidney disease, stage 3b: Secondary | ICD-10-CM | POA: Diagnosis not present

## 2020-03-12 DIAGNOSIS — N179 Acute kidney failure, unspecified: Secondary | ICD-10-CM | POA: Diagnosis not present

## 2020-03-12 DIAGNOSIS — N178 Other acute kidney failure: Secondary | ICD-10-CM | POA: Diagnosis not present

## 2020-03-14 ENCOUNTER — Inpatient Hospital Stay (HOSPITAL_COMMUNITY): Payer: Medicare HMO | Admitting: Speech Pathology

## 2020-03-17 DIAGNOSIS — I252 Old myocardial infarction: Secondary | ICD-10-CM | POA: Diagnosis not present

## 2020-03-17 DIAGNOSIS — I10 Essential (primary) hypertension: Secondary | ICD-10-CM | POA: Diagnosis not present

## 2020-03-17 DIAGNOSIS — E1165 Type 2 diabetes mellitus with hyperglycemia: Secondary | ICD-10-CM | POA: Diagnosis not present

## 2020-03-17 DIAGNOSIS — N390 Urinary tract infection, site not specified: Secondary | ICD-10-CM | POA: Diagnosis not present

## 2020-03-17 DIAGNOSIS — Z794 Long term (current) use of insulin: Secondary | ICD-10-CM | POA: Diagnosis not present

## 2020-03-17 DIAGNOSIS — H5462 Unqualified visual loss, left eye, normal vision right eye: Secondary | ICD-10-CM | POA: Diagnosis not present

## 2020-03-17 DIAGNOSIS — I2510 Atherosclerotic heart disease of native coronary artery without angina pectoris: Secondary | ICD-10-CM | POA: Diagnosis not present

## 2020-03-17 DIAGNOSIS — Z7982 Long term (current) use of aspirin: Secondary | ICD-10-CM | POA: Diagnosis not present

## 2020-03-17 DIAGNOSIS — E785 Hyperlipidemia, unspecified: Secondary | ICD-10-CM | POA: Diagnosis not present

## 2020-03-19 DIAGNOSIS — R809 Proteinuria, unspecified: Secondary | ICD-10-CM | POA: Diagnosis not present

## 2020-03-19 DIAGNOSIS — I10 Essential (primary) hypertension: Secondary | ICD-10-CM | POA: Diagnosis not present

## 2020-03-19 DIAGNOSIS — E1165 Type 2 diabetes mellitus with hyperglycemia: Secondary | ICD-10-CM | POA: Diagnosis not present

## 2020-03-19 DIAGNOSIS — Z794 Long term (current) use of insulin: Secondary | ICD-10-CM | POA: Diagnosis not present

## 2020-03-19 DIAGNOSIS — E785 Hyperlipidemia, unspecified: Secondary | ICD-10-CM | POA: Diagnosis not present

## 2020-03-19 DIAGNOSIS — N1832 Chronic kidney disease, stage 3b: Secondary | ICD-10-CM | POA: Diagnosis not present

## 2020-03-20 DIAGNOSIS — G4733 Obstructive sleep apnea (adult) (pediatric): Secondary | ICD-10-CM | POA: Diagnosis not present

## 2020-03-20 DIAGNOSIS — Z9989 Dependence on other enabling machines and devices: Secondary | ICD-10-CM | POA: Diagnosis not present

## 2020-03-20 DIAGNOSIS — I10 Essential (primary) hypertension: Secondary | ICD-10-CM | POA: Diagnosis not present

## 2020-03-20 DIAGNOSIS — J42 Unspecified chronic bronchitis: Secondary | ICD-10-CM | POA: Diagnosis not present

## 2020-03-20 DIAGNOSIS — I25118 Atherosclerotic heart disease of native coronary artery with other forms of angina pectoris: Secondary | ICD-10-CM | POA: Diagnosis not present

## 2020-03-20 DIAGNOSIS — E1159 Type 2 diabetes mellitus with other circulatory complications: Secondary | ICD-10-CM | POA: Diagnosis not present

## 2020-03-20 DIAGNOSIS — Z794 Long term (current) use of insulin: Secondary | ICD-10-CM | POA: Diagnosis not present

## 2020-03-20 DIAGNOSIS — I700 Atherosclerosis of aorta: Secondary | ICD-10-CM | POA: Diagnosis not present

## 2020-03-20 DIAGNOSIS — F330 Major depressive disorder, recurrent, mild: Secondary | ICD-10-CM | POA: Diagnosis not present

## 2020-03-24 DIAGNOSIS — E1165 Type 2 diabetes mellitus with hyperglycemia: Secondary | ICD-10-CM | POA: Diagnosis not present

## 2020-03-24 DIAGNOSIS — I252 Old myocardial infarction: Secondary | ICD-10-CM | POA: Diagnosis not present

## 2020-03-24 DIAGNOSIS — Z7982 Long term (current) use of aspirin: Secondary | ICD-10-CM | POA: Diagnosis not present

## 2020-03-24 DIAGNOSIS — H5462 Unqualified visual loss, left eye, normal vision right eye: Secondary | ICD-10-CM | POA: Diagnosis not present

## 2020-03-24 DIAGNOSIS — E785 Hyperlipidemia, unspecified: Secondary | ICD-10-CM | POA: Diagnosis not present

## 2020-03-24 DIAGNOSIS — N390 Urinary tract infection, site not specified: Secondary | ICD-10-CM | POA: Diagnosis not present

## 2020-03-24 DIAGNOSIS — I2510 Atherosclerotic heart disease of native coronary artery without angina pectoris: Secondary | ICD-10-CM | POA: Diagnosis not present

## 2020-03-24 DIAGNOSIS — I10 Essential (primary) hypertension: Secondary | ICD-10-CM | POA: Diagnosis not present

## 2020-03-24 DIAGNOSIS — Z794 Long term (current) use of insulin: Secondary | ICD-10-CM | POA: Diagnosis not present

## 2020-03-26 DIAGNOSIS — E785 Hyperlipidemia, unspecified: Secondary | ICD-10-CM | POA: Diagnosis not present

## 2020-03-26 DIAGNOSIS — Z7982 Long term (current) use of aspirin: Secondary | ICD-10-CM | POA: Diagnosis not present

## 2020-03-26 DIAGNOSIS — E1165 Type 2 diabetes mellitus with hyperglycemia: Secondary | ICD-10-CM | POA: Diagnosis not present

## 2020-03-26 DIAGNOSIS — I2510 Atherosclerotic heart disease of native coronary artery without angina pectoris: Secondary | ICD-10-CM | POA: Diagnosis not present

## 2020-03-26 DIAGNOSIS — Z794 Long term (current) use of insulin: Secondary | ICD-10-CM | POA: Diagnosis not present

## 2020-03-26 DIAGNOSIS — N390 Urinary tract infection, site not specified: Secondary | ICD-10-CM | POA: Diagnosis not present

## 2020-03-26 DIAGNOSIS — I10 Essential (primary) hypertension: Secondary | ICD-10-CM | POA: Diagnosis not present

## 2020-03-26 DIAGNOSIS — I252 Old myocardial infarction: Secondary | ICD-10-CM | POA: Diagnosis not present

## 2020-03-26 DIAGNOSIS — H5462 Unqualified visual loss, left eye, normal vision right eye: Secondary | ICD-10-CM | POA: Diagnosis not present

## 2020-03-29 ENCOUNTER — Ambulatory Visit: Payer: Medicare HMO | Attending: Internal Medicine

## 2020-03-29 DIAGNOSIS — Z23 Encounter for immunization: Secondary | ICD-10-CM

## 2020-03-29 NOTE — Progress Notes (Signed)
   Covid-19 Vaccination Clinic  Name:  LUGENE HITT    MRN: [id number] DOB: 1961-09-28  03/29/2020  Ms. Wrubel was observed post Covid-19 immunization for 15 minutes without incident. She was provided with Vaccine Information Sheet and instruction to access the V-Safe system.   Ms. Riemenschneider was instructed to call 911 with any severe reactions post vaccine: Marland Kitchen Difficulty breathing  . Swelling of face and throat  . A fast heartbeat  . A bad rash all over body  . Dizziness and weakness   Immunizations Administered    Name Date Dose VIS Date Route   Pfizer COVID-19 Vaccine 03/29/2020  9:41 AM 0.3 mL 11/30/2019 Intramuscular   Manufacturer: [place]   Lot: [id number]   [place]: [id number]

## 2020-04-01 ENCOUNTER — Telehealth (HOSPITAL_COMMUNITY): Payer: Self-pay

## 2020-04-01 DIAGNOSIS — E785 Hyperlipidemia, unspecified: Secondary | ICD-10-CM | POA: Diagnosis not present

## 2020-04-01 DIAGNOSIS — I252 Old myocardial infarction: Secondary | ICD-10-CM | POA: Diagnosis not present

## 2020-04-01 DIAGNOSIS — E1165 Type 2 diabetes mellitus with hyperglycemia: Secondary | ICD-10-CM | POA: Diagnosis not present

## 2020-04-01 DIAGNOSIS — N390 Urinary tract infection, site not specified: Secondary | ICD-10-CM | POA: Diagnosis not present

## 2020-04-01 DIAGNOSIS — I2510 Atherosclerotic heart disease of native coronary artery without angina pectoris: Secondary | ICD-10-CM | POA: Diagnosis not present

## 2020-04-01 DIAGNOSIS — H5462 Unqualified visual loss, left eye, normal vision right eye: Secondary | ICD-10-CM | POA: Diagnosis not present

## 2020-04-01 DIAGNOSIS — I10 Essential (primary) hypertension: Secondary | ICD-10-CM | POA: Diagnosis not present

## 2020-04-01 DIAGNOSIS — Z794 Long term (current) use of insulin: Secondary | ICD-10-CM | POA: Diagnosis not present

## 2020-04-01 DIAGNOSIS — Z7982 Long term (current) use of aspirin: Secondary | ICD-10-CM | POA: Diagnosis not present

## 2020-04-01 NOTE — Telephone Encounter (Signed)
Attempted contact, left message.   [place] [phone]

## 2020-04-04 DIAGNOSIS — I10 Essential (primary) hypertension: Secondary | ICD-10-CM | POA: Diagnosis not present

## 2020-04-04 DIAGNOSIS — I252 Old myocardial infarction: Secondary | ICD-10-CM | POA: Diagnosis not present

## 2020-04-04 DIAGNOSIS — H5462 Unqualified visual loss, left eye, normal vision right eye: Secondary | ICD-10-CM | POA: Diagnosis not present

## 2020-04-04 DIAGNOSIS — Z794 Long term (current) use of insulin: Secondary | ICD-10-CM | POA: Diagnosis not present

## 2020-04-04 DIAGNOSIS — E1165 Type 2 diabetes mellitus with hyperglycemia: Secondary | ICD-10-CM | POA: Diagnosis not present

## 2020-04-04 DIAGNOSIS — N390 Urinary tract infection, site not specified: Secondary | ICD-10-CM | POA: Diagnosis not present

## 2020-04-04 DIAGNOSIS — Z7982 Long term (current) use of aspirin: Secondary | ICD-10-CM | POA: Diagnosis not present

## 2020-04-04 DIAGNOSIS — E785 Hyperlipidemia, unspecified: Secondary | ICD-10-CM | POA: Diagnosis not present

## 2020-04-04 DIAGNOSIS — I2510 Atherosclerotic heart disease of native coronary artery without angina pectoris: Secondary | ICD-10-CM | POA: Diagnosis not present

## 2020-04-09 DIAGNOSIS — I252 Old myocardial infarction: Secondary | ICD-10-CM | POA: Diagnosis not present

## 2020-04-09 DIAGNOSIS — H5462 Unqualified visual loss, left eye, normal vision right eye: Secondary | ICD-10-CM | POA: Diagnosis not present

## 2020-04-09 DIAGNOSIS — E1165 Type 2 diabetes mellitus with hyperglycemia: Secondary | ICD-10-CM | POA: Diagnosis not present

## 2020-04-09 DIAGNOSIS — I10 Essential (primary) hypertension: Secondary | ICD-10-CM | POA: Diagnosis not present

## 2020-04-09 DIAGNOSIS — I2510 Atherosclerotic heart disease of native coronary artery without angina pectoris: Secondary | ICD-10-CM | POA: Diagnosis not present

## 2020-04-09 DIAGNOSIS — N390 Urinary tract infection, site not specified: Secondary | ICD-10-CM | POA: Diagnosis not present

## 2020-04-09 DIAGNOSIS — Z7982 Long term (current) use of aspirin: Secondary | ICD-10-CM | POA: Diagnosis not present

## 2020-04-09 DIAGNOSIS — E785 Hyperlipidemia, unspecified: Secondary | ICD-10-CM | POA: Diagnosis not present

## 2020-04-09 DIAGNOSIS — Z794 Long term (current) use of insulin: Secondary | ICD-10-CM | POA: Diagnosis not present

## 2020-04-10 DIAGNOSIS — E1165 Type 2 diabetes mellitus with hyperglycemia: Secondary | ICD-10-CM | POA: Diagnosis not present

## 2020-04-10 DIAGNOSIS — H5462 Unqualified visual loss, left eye, normal vision right eye: Secondary | ICD-10-CM | POA: Diagnosis not present

## 2020-04-10 DIAGNOSIS — Z794 Long term (current) use of insulin: Secondary | ICD-10-CM | POA: Diagnosis not present

## 2020-04-10 DIAGNOSIS — Z7982 Long term (current) use of aspirin: Secondary | ICD-10-CM | POA: Diagnosis not present

## 2020-04-10 DIAGNOSIS — I252 Old myocardial infarction: Secondary | ICD-10-CM | POA: Diagnosis not present

## 2020-04-10 DIAGNOSIS — I2510 Atherosclerotic heart disease of native coronary artery without angina pectoris: Secondary | ICD-10-CM | POA: Diagnosis not present

## 2020-04-10 DIAGNOSIS — N390 Urinary tract infection, site not specified: Secondary | ICD-10-CM | POA: Diagnosis not present

## 2020-04-10 DIAGNOSIS — E785 Hyperlipidemia, unspecified: Secondary | ICD-10-CM | POA: Diagnosis not present

## 2020-04-10 DIAGNOSIS — I10 Essential (primary) hypertension: Secondary | ICD-10-CM | POA: Diagnosis not present

## 2020-04-14 ENCOUNTER — Ambulatory Visit: Payer: Medicare HMO | Admitting: Family

## 2020-04-14 NOTE — Progress Notes (Signed)
Patient ID: Terry Arroyo, female    DOB: Sep 21, 1961, 59 y.o.   MRN: [id number]  HPI  Terry Arroyo is a 59 y/o female with a history of DM, hyperlipidemia, HTN, brain tumor, left eye blindness, previous tobacco use and chronic heart failure.   Echo report from 02/27/20 reviewed and showed an EF of 30-35% along with mild MR.   Admitted 02/26/20 due to HTN and HF exacerbation. Cardiology and nephrology consults obtained. Initially needed IV lasix but then held due to worsening renal function and then transitioned to oral diuretics. Negative for DVT. Discharged after 5 days.   She presents today for a follow-up visit with a chief complaint of moderate shortness of breath upon minimal exertion. She describes this as chronic in nature having been present for several years. She has associated fatigue, light-headedness, weakness, pedal edema, nausea and chronic pain. She denies any difficulty sleeping, abdominal distention, palpitations, chest pain, cough or weight gain.   She says that she lives with her dad who keeps the house "very warm" because he's on blood thinners and stays cold all the time. She says that she needs to find a place to live on her own because she feels like her symptoms worsen when she gets too hot.   She is currently taking both metoprolol and carvedilol.   She has received her first COVID vaccine.   Past Medical History:  Diagnosis Date  . Blind left eye   . Brain tumor ([place]) 1986  . CHF (congestive heart failure) ([place])   . Diabetes mellitus without complication ([place])   . Hyperlipidemia   . Hypertension   . MI (myocardial infarction) ([place]) 2018   Past Surgical History:  Procedure Laterality Date  . BRAIN SURGERY    . CESAREAN SECTION     Family History  Problem Relation Age of Onset  . CAD Mother   . CAD Brother    Social History   Tobacco Use  . Smoking status: Former Research scientist (life sciences)  . Smokeless tobacco: Never Used  Substance Use Topics  . Alcohol use: Not Currently    Allergies  Allergen Reactions  . Hydralazine Shortness Of Breath and Swelling    Body aches  . Shellfish Allergy Anaphylaxis    Shrimp/lobster  Shrimp/lobster    . Kiwi Extract Swelling  . Metformin Other (See Comments)    Other reaction(s): GI Intolerance   Prior to Admission medications   Medication Sig Start Date End Date Taking? Authorizing Provider  amLODipine (NORVASC) 10 MG tablet Take 1 tablet (10 mg total) by mouth daily. 03/03/20  Yes Lorella Nimrod, MD  aspirin EC 81 MG EC tablet Take 1 tablet (81 mg total) by mouth daily. 11/26/19  Yes Lavina Hamman, MD  atorvastatin (LIPITOR) 80 MG tablet Take 1 tablet (80 mg total) by mouth daily at 6 PM. Patient taking differently: Take 80 mg by mouth daily.  11/26/19  Yes Lavina Hamman, MD  carvedilol (COREG) 12.5 MG tablet Take 12.5 mg by mouth 2 (two) times daily with a meal.   Yes [provider]  Fluticasone-Salmeterol (ADVAIR) 250-50 MCG/DOSE AEPB Inhale 1 puff into the lungs 2 (two) times daily.   Yes [provider]  furosemide (LASIX) 40 MG tablet Take 1 tablet (40 mg total) by mouth daily. 03/02/20  Yes Lorella Nimrod, MD  insulin aspart protamine- aspart (NOVOLOG MIX 70/30) (70-30) 100 UNIT/ML injection Inject 0.4 mLs (40 Units total) into the skin 2 (two) times daily with a meal.  Patient taking differently: Inject 45 Units into the skin 2 (two) times daily with a meal.  11/26/19  Yes Lavina Hamman, MD  isosorbide mononitrate (IMDUR) 30 MG 24 hr tablet Take 30 mg by mouth daily.   Yes [provider]  lisinopril (ZESTRIL) 30 MG tablet Take 30 mg by mouth daily.   Yes [provider]  methocarbamol (ROBAXIN) 500 MG tablet Take 1 tablet (500 mg total) by mouth every 8 (eight) hours as needed for muscle spasms. 11/26/19  Yes Lavina Hamman, MD  mirtazapine (REMERON) 30 MG tablet Take 1 tablet (30 mg total) by mouth at bedtime. 11/26/19  Yes Lavina Hamman, MD  mometasone-formoterol [facility])  200-5 MCG/ACT AERO Inhale 2 puffs into the lungs 2 (two) times daily.   Yes [provider]  topiramate (TOPAMAX) 25 MG capsule Take 25 mg by mouth daily.   Yes [provider]  valACYclovir (VALTREX) 500 MG tablet Take 1 tablet by mouth 2 (two) times daily. 05/20/17  Yes [provider]    Review of Systems  Constitutional: Positive for fatigue (easily). Negative for appetite change.  HENT: Negative for congestion, postnasal drip and sore throat.   Eyes: Positive for visual disturbance (blind in left eye). Negative for pain.  Respiratory: Positive for shortness of breath (easily). Negative for cough.   Cardiovascular: Positive for leg swelling. Negative for chest pain and palpitations.  Gastrointestinal: Positive for nausea. Negative for abdominal distention and abdominal pain.  Endocrine: Negative.   Genitourinary: Negative.   Musculoskeletal: Positive for back pain. Negative for neck pain.  Skin: Negative.   Allergic/Immunologic: Negative.   Neurological: Positive for weakness and light-headedness. Negative for dizziness.  Hematological: Negative for adenopathy. Does not bruise/bleed easily.  Psychiatric/Behavioral: Negative for dysphoric mood and sleep disturbance (sleeping on 1 pillow). The patient is not nervous/anxious.    Vitals:   04/15/20 1215  BP: (!) 141/89  Pulse: 69  Resp: 18  SpO2: 100%  Weight: 178 lb 8 oz (81 kg)  Height: 5\' 2"  (1.575 m)   Wt Readings from Last 3 Encounters:  04/15/20 178 lb 8 oz (81 kg)  03/11/20 176 lb 12.8 oz (80.2 kg)  03/06/20 179 lb 3.2 oz (81.3 kg)   Lab Results  Component Value Date   CREATININE 2.21 (H) 03/02/2020   CREATININE 2.16 (H) 03/01/2020   CREATININE 2.11 (H) 02/29/2020    Physical Exam Vitals and nursing note reviewed.  Constitutional:      Appearance: Normal appearance.  HENT:     Head: Normocephalic and atraumatic.  Cardiovascular:     Rate and Rhythm: Normal rate and regular rhythm.   Pulmonary:     Effort: Pulmonary effort is normal. No respiratory distress.     Breath sounds: No wheezing or rales.  Abdominal:     General: There is no distension.     Palpations: Abdomen is soft.  Musculoskeletal:        General: No tenderness.     Cervical back: Normal range of motion and neck supple.     Right lower leg: Edema (1+ pitting) present.     Left lower leg: Edema (1+ pitting) present.  Skin:    General: Skin is warm and dry.  Neurological:     Mental Status: She is alert and oriented to person, place, and time. Mental status is at baseline.  Psychiatric:        Mood and Affect: Mood is anxious.  Behavior: Behavior normal.        Thought Content: Thought content normal.    Assessment & Plan:  1: Chronic heart failure with reduced ejection fraction- - NYHA class III - euvolemic today - weighing daily and she says that her weight has been stable; reminded to call for an overnight weight gain of >2 pounds or a weekly weight gain of >5 pounds - weight stable from last visit here 1 month ago - not adding salt and has been trying to look at food labels; reviewed the importance of closely following a 2000mg  sodium diet  - had telemedicine visit with cardiology Barrington Ellison) 02/13/20 - patient taking both metoprolol and carvedilol so she was instructed to stop the metoprolol and continue carvedilol - participating in paramedicine program - BNP 02/25/30 was 610.0 - renal function limits the use of entresto  2: HTN- - BP ok today - saw PCP Ouida Sills) 03/20/20 - BMP from 03/12/20 reviewed and showed sodium 135, potassium 3.8, creatinine 1.73 and GFR 37  3: DM- - saw endocrinology Pasty Arch) 03/19/20 - A1c 02/27/20 was 10.2% - glucose at home this morning was 91 - saw nephrology ([place]) 03/12/20 & returns May 2021  4: Anxiety- - patient is irritated with her dad (who she lives with) and how hot he keeps the house; she feels like her symptoms are worse when she gets  overheated  5: Lymphedema- - stage 2 - wearing compression socks daily and says that her swelling has improved - elevate legs when sitting for long periods of time - limited in her ability to exercise due to her symptoms - consider lymphapress compression boots if edema persists   Medication bottles were reviewed.  Return in 6 months or sooner for any questions/problems befofre then.

## 2020-04-15 ENCOUNTER — Ambulatory Visit: Payer: Medicare HMO | Attending: Family | Admitting: Family

## 2020-04-15 ENCOUNTER — Encounter: Payer: Self-pay | Admitting: Family

## 2020-04-15 ENCOUNTER — Other Ambulatory Visit: Payer: Self-pay

## 2020-04-15 VITALS — BP 141/89 | HR 69 | Resp 18 | Ht 62.0 in | Wt 178.5 lb

## 2020-04-15 DIAGNOSIS — E785 Hyperlipidemia, unspecified: Secondary | ICD-10-CM | POA: Insufficient documentation

## 2020-04-15 DIAGNOSIS — I890 Lymphedema, not elsewhere classified: Secondary | ICD-10-CM | POA: Diagnosis not present

## 2020-04-15 DIAGNOSIS — I110 Hypertensive heart disease with heart failure: Secondary | ICD-10-CM | POA: Insufficient documentation

## 2020-04-15 DIAGNOSIS — Z79899 Other long term (current) drug therapy: Secondary | ICD-10-CM | POA: Insufficient documentation

## 2020-04-15 DIAGNOSIS — R110 Nausea: Secondary | ICD-10-CM | POA: Insufficient documentation

## 2020-04-15 DIAGNOSIS — N184 Chronic kidney disease, stage 4 (severe): Secondary | ICD-10-CM

## 2020-04-15 DIAGNOSIS — G8929 Other chronic pain: Secondary | ICD-10-CM | POA: Insufficient documentation

## 2020-04-15 DIAGNOSIS — R5383 Other fatigue: Secondary | ICD-10-CM | POA: Diagnosis not present

## 2020-04-15 DIAGNOSIS — H5462 Unqualified visual loss, left eye, normal vision right eye: Secondary | ICD-10-CM | POA: Diagnosis not present

## 2020-04-15 DIAGNOSIS — Z7982 Long term (current) use of aspirin: Secondary | ICD-10-CM | POA: Diagnosis not present

## 2020-04-15 DIAGNOSIS — R0602 Shortness of breath: Secondary | ICD-10-CM | POA: Insufficient documentation

## 2020-04-15 DIAGNOSIS — I5022 Chronic systolic (congestive) heart failure: Secondary | ICD-10-CM | POA: Insufficient documentation

## 2020-04-15 DIAGNOSIS — Z7901 Long term (current) use of anticoagulants: Secondary | ICD-10-CM | POA: Diagnosis not present

## 2020-04-15 DIAGNOSIS — E119 Type 2 diabetes mellitus without complications: Secondary | ICD-10-CM | POA: Insufficient documentation

## 2020-04-15 DIAGNOSIS — Z794 Long term (current) use of insulin: Secondary | ICD-10-CM | POA: Diagnosis not present

## 2020-04-15 DIAGNOSIS — Z8249 Family history of ischemic heart disease and other diseases of the circulatory system: Secondary | ICD-10-CM | POA: Diagnosis not present

## 2020-04-15 DIAGNOSIS — I10 Essential (primary) hypertension: Secondary | ICD-10-CM | POA: Diagnosis not present

## 2020-04-15 DIAGNOSIS — R531 Weakness: Secondary | ICD-10-CM | POA: Insufficient documentation

## 2020-04-15 DIAGNOSIS — R42 Dizziness and giddiness: Secondary | ICD-10-CM | POA: Diagnosis not present

## 2020-04-15 DIAGNOSIS — E1165 Type 2 diabetes mellitus with hyperglycemia: Secondary | ICD-10-CM | POA: Diagnosis not present

## 2020-04-15 DIAGNOSIS — N390 Urinary tract infection, site not specified: Secondary | ICD-10-CM | POA: Diagnosis not present

## 2020-04-15 DIAGNOSIS — I252 Old myocardial infarction: Secondary | ICD-10-CM | POA: Diagnosis not present

## 2020-04-15 DIAGNOSIS — E1122 Type 2 diabetes mellitus with diabetic chronic kidney disease: Secondary | ICD-10-CM

## 2020-04-15 DIAGNOSIS — Z87891 Personal history of nicotine dependence: Secondary | ICD-10-CM | POA: Diagnosis not present

## 2020-04-15 DIAGNOSIS — F419 Anxiety disorder, unspecified: Secondary | ICD-10-CM

## 2020-04-15 DIAGNOSIS — I2510 Atherosclerotic heart disease of native coronary artery without angina pectoris: Secondary | ICD-10-CM | POA: Diagnosis not present

## 2020-04-15 NOTE — Patient Instructions (Addendum)
Continue weighing daily and call for an overnight weight gain of > 2 pounds or a weekly weight gain of >5 pounds.   Stop taking the metoprolol as you are taking the carvedilol

## 2020-04-16 DIAGNOSIS — I2510 Atherosclerotic heart disease of native coronary artery without angina pectoris: Secondary | ICD-10-CM | POA: Diagnosis not present

## 2020-04-16 DIAGNOSIS — I10 Essential (primary) hypertension: Secondary | ICD-10-CM | POA: Diagnosis not present

## 2020-04-16 DIAGNOSIS — E1165 Type 2 diabetes mellitus with hyperglycemia: Secondary | ICD-10-CM | POA: Diagnosis not present

## 2020-04-16 DIAGNOSIS — I252 Old myocardial infarction: Secondary | ICD-10-CM | POA: Diagnosis not present

## 2020-04-16 DIAGNOSIS — H5462 Unqualified visual loss, left eye, normal vision right eye: Secondary | ICD-10-CM | POA: Diagnosis not present

## 2020-04-16 DIAGNOSIS — Z7982 Long term (current) use of aspirin: Secondary | ICD-10-CM | POA: Diagnosis not present

## 2020-04-16 DIAGNOSIS — N390 Urinary tract infection, site not specified: Secondary | ICD-10-CM | POA: Diagnosis not present

## 2020-04-16 DIAGNOSIS — E785 Hyperlipidemia, unspecified: Secondary | ICD-10-CM | POA: Diagnosis not present

## 2020-04-16 DIAGNOSIS — Z794 Long term (current) use of insulin: Secondary | ICD-10-CM | POA: Diagnosis not present

## 2020-04-17 DIAGNOSIS — H2513 Age-related nuclear cataract, bilateral: Secondary | ICD-10-CM | POA: Diagnosis not present

## 2020-04-17 DIAGNOSIS — H47292 Other optic atrophy, left eye: Secondary | ICD-10-CM | POA: Diagnosis not present

## 2020-04-17 DIAGNOSIS — H43391 Other vitreous opacities, right eye: Secondary | ICD-10-CM | POA: Diagnosis not present

## 2020-04-22 DIAGNOSIS — N390 Urinary tract infection, site not specified: Secondary | ICD-10-CM | POA: Diagnosis not present

## 2020-04-22 DIAGNOSIS — I10 Essential (primary) hypertension: Secondary | ICD-10-CM | POA: Diagnosis not present

## 2020-04-22 DIAGNOSIS — E1165 Type 2 diabetes mellitus with hyperglycemia: Secondary | ICD-10-CM | POA: Diagnosis not present

## 2020-04-22 DIAGNOSIS — Z794 Long term (current) use of insulin: Secondary | ICD-10-CM | POA: Diagnosis not present

## 2020-04-22 DIAGNOSIS — H5462 Unqualified visual loss, left eye, normal vision right eye: Secondary | ICD-10-CM | POA: Diagnosis not present

## 2020-04-22 DIAGNOSIS — I2510 Atherosclerotic heart disease of native coronary artery without angina pectoris: Secondary | ICD-10-CM | POA: Diagnosis not present

## 2020-04-22 DIAGNOSIS — E785 Hyperlipidemia, unspecified: Secondary | ICD-10-CM | POA: Diagnosis not present

## 2020-04-22 DIAGNOSIS — I252 Old myocardial infarction: Secondary | ICD-10-CM | POA: Diagnosis not present

## 2020-04-22 DIAGNOSIS — Z7982 Long term (current) use of aspirin: Secondary | ICD-10-CM | POA: Diagnosis not present

## 2020-04-23 ENCOUNTER — Other Ambulatory Visit: Payer: Self-pay

## 2020-04-23 ENCOUNTER — Ambulatory Visit: Payer: Medicare HMO | Attending: Internal Medicine

## 2020-04-23 DIAGNOSIS — Z23 Encounter for immunization: Secondary | ICD-10-CM

## 2020-04-23 NOTE — Progress Notes (Signed)
   Covid-19 Vaccination Clinic  Name:  Terry Arroyo    MRN: [id number] DOB: 06-09-1961  04/23/2020  [name] was observed post Covid-19 immunization for 15 minutes without incident. She was provided with Vaccine Information Sheet and instruction to access the V-Safe system.   [name] was instructed to call 911 with any severe reactions post vaccine: Marland Kitchen Difficulty breathing  . Swelling of face and throat  . A fast heartbeat  . A bad rash all over body  . Dizziness and weakness   Immunizations Administered    Name Date Dose VIS Date Route   Pfizer COVID-19 Vaccine 04/23/2020 12:39 PM 0.3 mL 02/13/2019 Intramuscular   Manufacturer: [place]   Lot: [id number]   [place]: [id number]

## 2020-04-24 DIAGNOSIS — N1832 Chronic kidney disease, stage 3b: Secondary | ICD-10-CM | POA: Diagnosis not present

## 2020-04-24 DIAGNOSIS — N178 Other acute kidney failure: Secondary | ICD-10-CM | POA: Diagnosis not present

## 2020-04-24 DIAGNOSIS — E1122 Type 2 diabetes mellitus with diabetic chronic kidney disease: Secondary | ICD-10-CM | POA: Diagnosis not present

## 2020-04-24 DIAGNOSIS — N2581 Secondary hyperparathyroidism of renal origin: Secondary | ICD-10-CM | POA: Insufficient documentation

## 2020-04-24 DIAGNOSIS — D631 Anemia in chronic kidney disease: Secondary | ICD-10-CM | POA: Diagnosis not present

## 2020-04-24 DIAGNOSIS — E871 Hypo-osmolality and hyponatremia: Secondary | ICD-10-CM | POA: Diagnosis not present

## 2020-04-24 DIAGNOSIS — R809 Proteinuria, unspecified: Secondary | ICD-10-CM | POA: Diagnosis not present

## 2020-04-24 DIAGNOSIS — I129 Hypertensive chronic kidney disease with stage 1 through stage 4 chronic kidney disease, or unspecified chronic kidney disease: Secondary | ICD-10-CM | POA: Diagnosis not present

## 2020-04-25 DIAGNOSIS — Z794 Long term (current) use of insulin: Secondary | ICD-10-CM | POA: Diagnosis not present

## 2020-04-25 DIAGNOSIS — Z7982 Long term (current) use of aspirin: Secondary | ICD-10-CM | POA: Diagnosis not present

## 2020-04-25 DIAGNOSIS — H5462 Unqualified visual loss, left eye, normal vision right eye: Secondary | ICD-10-CM | POA: Diagnosis not present

## 2020-04-25 DIAGNOSIS — I252 Old myocardial infarction: Secondary | ICD-10-CM | POA: Diagnosis not present

## 2020-04-25 DIAGNOSIS — E785 Hyperlipidemia, unspecified: Secondary | ICD-10-CM | POA: Diagnosis not present

## 2020-04-25 DIAGNOSIS — I2510 Atherosclerotic heart disease of native coronary artery without angina pectoris: Secondary | ICD-10-CM | POA: Diagnosis not present

## 2020-04-25 DIAGNOSIS — N390 Urinary tract infection, site not specified: Secondary | ICD-10-CM | POA: Diagnosis not present

## 2020-04-25 DIAGNOSIS — I10 Essential (primary) hypertension: Secondary | ICD-10-CM | POA: Diagnosis not present

## 2020-04-25 DIAGNOSIS — E1165 Type 2 diabetes mellitus with hyperglycemia: Secondary | ICD-10-CM | POA: Diagnosis not present

## 2020-04-28 DIAGNOSIS — I252 Old myocardial infarction: Secondary | ICD-10-CM | POA: Diagnosis not present

## 2020-04-28 DIAGNOSIS — Z794 Long term (current) use of insulin: Secondary | ICD-10-CM | POA: Diagnosis not present

## 2020-04-28 DIAGNOSIS — H5462 Unqualified visual loss, left eye, normal vision right eye: Secondary | ICD-10-CM | POA: Diagnosis not present

## 2020-04-28 DIAGNOSIS — E785 Hyperlipidemia, unspecified: Secondary | ICD-10-CM | POA: Diagnosis not present

## 2020-04-28 DIAGNOSIS — I10 Essential (primary) hypertension: Secondary | ICD-10-CM | POA: Diagnosis not present

## 2020-04-28 DIAGNOSIS — I2510 Atherosclerotic heart disease of native coronary artery without angina pectoris: Secondary | ICD-10-CM | POA: Diagnosis not present

## 2020-04-28 DIAGNOSIS — E1165 Type 2 diabetes mellitus with hyperglycemia: Secondary | ICD-10-CM | POA: Diagnosis not present

## 2020-04-28 DIAGNOSIS — N390 Urinary tract infection, site not specified: Secondary | ICD-10-CM | POA: Diagnosis not present

## 2020-04-28 DIAGNOSIS — Z7982 Long term (current) use of aspirin: Secondary | ICD-10-CM | POA: Diagnosis not present

## 2020-05-08 ENCOUNTER — Encounter (HOSPITAL_COMMUNITY): Payer: Self-pay

## 2020-05-08 ENCOUNTER — Other Ambulatory Visit (HOSPITAL_COMMUNITY): Payer: Self-pay

## 2020-05-08 NOTE — Progress Notes (Signed)
Bgl:160  Today had a home visit with Enid Derry.  She states doing well.  She has just a little but of fluid in ankle area.  Legs has no swelling.  She states wearing her flip flops today but usually she has on compression stockings.  Advised her to elevate her legs when sitting.  She states has appt with eye doctor next week to discuss cataract surgery, she is blind in one eye and vision is poor in other.   She has all her medications, verified them.  Her sugar has been more controlled.  She has everything for daily living.  She watches what she eats, low salt, low carbs and sugars.  She is cutting back on her water and fluids.  She was drinking about 160 ounces to 200 ounces and she is down to about 90 ounces now.  She is still cutting it back.  Reminded her that all liquids count as fluids, such as milk, juices, soups and ice cream.  She states she loves to drink milk, she will start to subtract that from her water.  She is using hard candy to quench her thirst.   She denies chest pain, headaches, dizziness or shortness of breath.  She exercises everyday with what Physical therapy taught her.  She remains active around the house.  Will continue to visit for heart failure.   [place] [phone]

## 2020-05-13 DIAGNOSIS — Z7982 Long term (current) use of aspirin: Secondary | ICD-10-CM | POA: Diagnosis not present

## 2020-05-13 DIAGNOSIS — I10 Essential (primary) hypertension: Secondary | ICD-10-CM | POA: Diagnosis not present

## 2020-05-13 DIAGNOSIS — I2510 Atherosclerotic heart disease of native coronary artery without angina pectoris: Secondary | ICD-10-CM | POA: Diagnosis not present

## 2020-05-13 DIAGNOSIS — N390 Urinary tract infection, site not specified: Secondary | ICD-10-CM | POA: Diagnosis not present

## 2020-05-13 DIAGNOSIS — Z794 Long term (current) use of insulin: Secondary | ICD-10-CM | POA: Diagnosis not present

## 2020-05-13 DIAGNOSIS — E1165 Type 2 diabetes mellitus with hyperglycemia: Secondary | ICD-10-CM | POA: Diagnosis not present

## 2020-05-13 DIAGNOSIS — I252 Old myocardial infarction: Secondary | ICD-10-CM | POA: Diagnosis not present

## 2020-05-13 DIAGNOSIS — H5462 Unqualified visual loss, left eye, normal vision right eye: Secondary | ICD-10-CM | POA: Diagnosis not present

## 2020-05-13 DIAGNOSIS — E785 Hyperlipidemia, unspecified: Secondary | ICD-10-CM | POA: Diagnosis not present

## 2020-05-21 DIAGNOSIS — Z7982 Long term (current) use of aspirin: Secondary | ICD-10-CM | POA: Diagnosis not present

## 2020-05-21 DIAGNOSIS — E1165 Type 2 diabetes mellitus with hyperglycemia: Secondary | ICD-10-CM | POA: Diagnosis not present

## 2020-05-21 DIAGNOSIS — I10 Essential (primary) hypertension: Secondary | ICD-10-CM | POA: Diagnosis not present

## 2020-05-21 DIAGNOSIS — N390 Urinary tract infection, site not specified: Secondary | ICD-10-CM | POA: Diagnosis not present

## 2020-05-21 DIAGNOSIS — Z794 Long term (current) use of insulin: Secondary | ICD-10-CM | POA: Diagnosis not present

## 2020-05-21 DIAGNOSIS — I2510 Atherosclerotic heart disease of native coronary artery without angina pectoris: Secondary | ICD-10-CM | POA: Diagnosis not present

## 2020-05-21 DIAGNOSIS — E785 Hyperlipidemia, unspecified: Secondary | ICD-10-CM | POA: Diagnosis not present

## 2020-05-21 DIAGNOSIS — H5462 Unqualified visual loss, left eye, normal vision right eye: Secondary | ICD-10-CM | POA: Diagnosis not present

## 2020-05-21 DIAGNOSIS — I252 Old myocardial infarction: Secondary | ICD-10-CM | POA: Diagnosis not present

## 2020-05-29 ENCOUNTER — Telehealth (HOSPITAL_COMMUNITY): Payer: Self-pay

## 2020-05-29 NOTE — Telephone Encounter (Signed)
Weight 178 lbs.  Today had a telephone visit with Enid Derry.  She has been doing well as far as Heart Failure.  She has completed her home PT.  She is continuing to walk and exercise on her own.  She denies any chest pain, headaches or dizziness.  Denies shortness of breath or cough.  She has an appt for cataract surgery in July, she states her eyes are getting worse.  She is excited to get them fixed. She has all her medications and aware of how to take them.  She watches her diet and fluids she takes in daily. She weighs daily and weight is staying the same.  Will continue to visit for heart failure.   [place] [phone]

## 2020-06-03 ENCOUNTER — Other Ambulatory Visit: Payer: Self-pay | Admitting: Obstetrics and Gynecology

## 2020-06-03 DIAGNOSIS — Z1231 Encounter for screening mammogram for malignant neoplasm of breast: Secondary | ICD-10-CM

## 2020-06-19 DIAGNOSIS — R809 Proteinuria, unspecified: Secondary | ICD-10-CM | POA: Diagnosis not present

## 2020-06-19 DIAGNOSIS — E785 Hyperlipidemia, unspecified: Secondary | ICD-10-CM | POA: Diagnosis not present

## 2020-06-19 DIAGNOSIS — Z794 Long term (current) use of insulin: Secondary | ICD-10-CM | POA: Diagnosis not present

## 2020-06-19 DIAGNOSIS — N1832 Chronic kidney disease, stage 3b: Secondary | ICD-10-CM | POA: Diagnosis not present

## 2020-06-19 DIAGNOSIS — E1165 Type 2 diabetes mellitus with hyperglycemia: Secondary | ICD-10-CM | POA: Diagnosis not present

## 2020-06-19 DIAGNOSIS — I10 Essential (primary) hypertension: Secondary | ICD-10-CM | POA: Diagnosis not present

## 2020-06-20 DIAGNOSIS — Z794 Long term (current) use of insulin: Secondary | ICD-10-CM | POA: Diagnosis not present

## 2020-06-20 DIAGNOSIS — I10 Essential (primary) hypertension: Secondary | ICD-10-CM | POA: Diagnosis not present

## 2020-06-20 DIAGNOSIS — E1159 Type 2 diabetes mellitus with other circulatory complications: Secondary | ICD-10-CM | POA: Diagnosis not present

## 2020-06-20 DIAGNOSIS — I25118 Atherosclerotic heart disease of native coronary artery with other forms of angina pectoris: Secondary | ICD-10-CM | POA: Diagnosis not present

## 2020-06-27 DIAGNOSIS — I700 Atherosclerosis of aorta: Secondary | ICD-10-CM | POA: Diagnosis not present

## 2020-06-27 DIAGNOSIS — N184 Chronic kidney disease, stage 4 (severe): Secondary | ICD-10-CM | POA: Diagnosis not present

## 2020-06-27 DIAGNOSIS — I129 Hypertensive chronic kidney disease with stage 1 through stage 4 chronic kidney disease, or unspecified chronic kidney disease: Secondary | ICD-10-CM | POA: Diagnosis not present

## 2020-06-27 DIAGNOSIS — Z0000 Encounter for general adult medical examination without abnormal findings: Secondary | ICD-10-CM | POA: Insufficient documentation

## 2020-06-27 DIAGNOSIS — Z794 Long term (current) use of insulin: Secondary | ICD-10-CM | POA: Diagnosis not present

## 2020-06-27 DIAGNOSIS — I25118 Atherosclerotic heart disease of native coronary artery with other forms of angina pectoris: Secondary | ICD-10-CM | POA: Diagnosis not present

## 2020-06-27 DIAGNOSIS — J42 Unspecified chronic bronchitis: Secondary | ICD-10-CM | POA: Diagnosis not present

## 2020-06-27 DIAGNOSIS — E1159 Type 2 diabetes mellitus with other circulatory complications: Secondary | ICD-10-CM | POA: Diagnosis not present

## 2020-06-27 DIAGNOSIS — E1122 Type 2 diabetes mellitus with diabetic chronic kidney disease: Secondary | ICD-10-CM | POA: Diagnosis not present

## 2020-07-01 ENCOUNTER — Ambulatory Visit
Admission: RE | Admit: 2020-07-01 | Discharge: 2020-07-01 | Disposition: A | Discharge status: Home / Self Care | Payer: Medicare HMO | Source: Ambulatory Visit | Attending: Obstetrics and Gynecology | Admitting: Obstetrics and Gynecology

## 2020-07-01 DIAGNOSIS — Z1231 Encounter for screening mammogram for malignant neoplasm of breast: Secondary | ICD-10-CM | POA: Diagnosis not present

## 2020-07-01 IMAGING — MG DIGITAL SCREENING BILAT W/ TOMO W/ CAD
8 series · 8 of 24 positions shown · non-contrast
Comparison: Previous exam(s).

CLINICAL DATA: Screening.

EXAM:
DIGITAL SCREENING BILATERAL MAMMOGRAM WITH TOMO AND CAD

[R CC synth-2D]
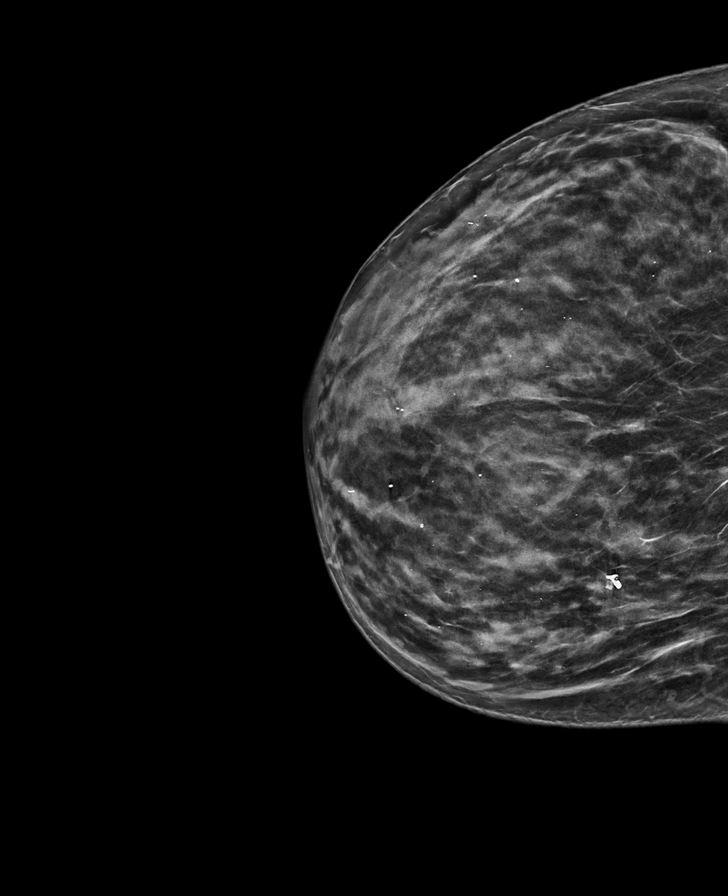

[L MLO synth-2D]
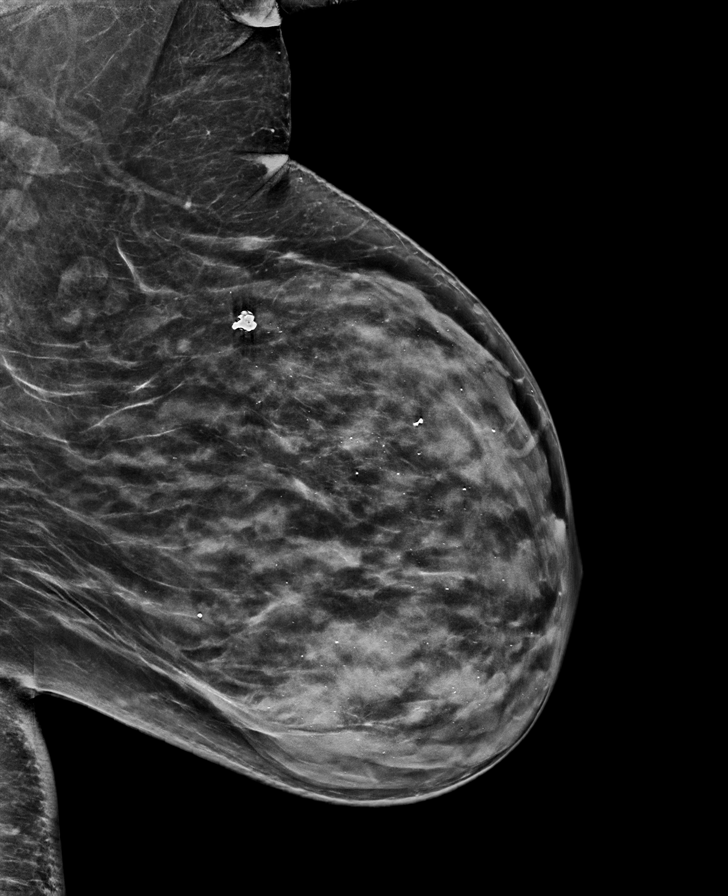

[L CC synth-2D]
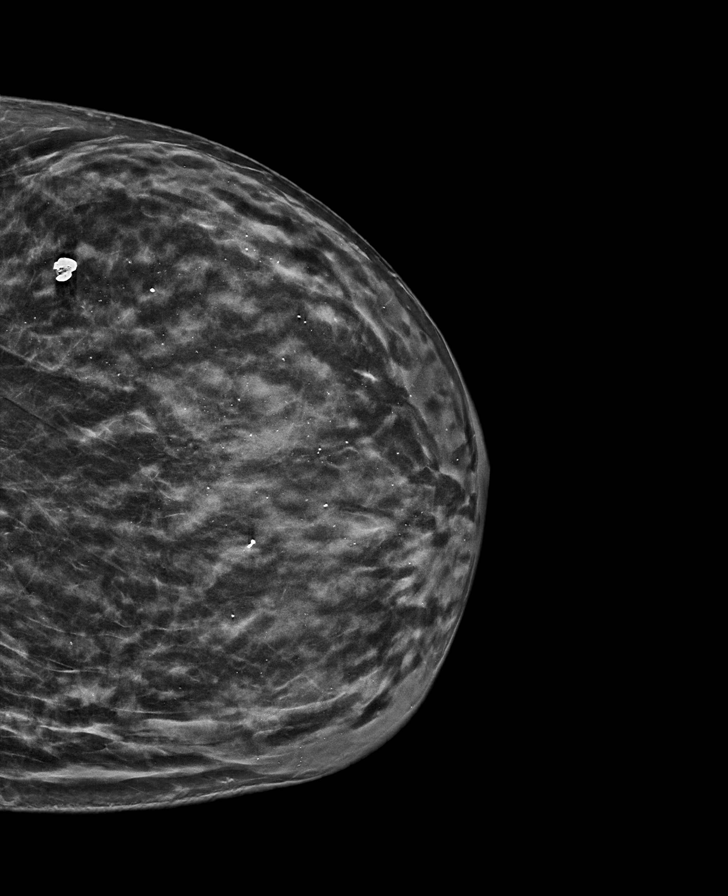

[R MLO synth-2D]
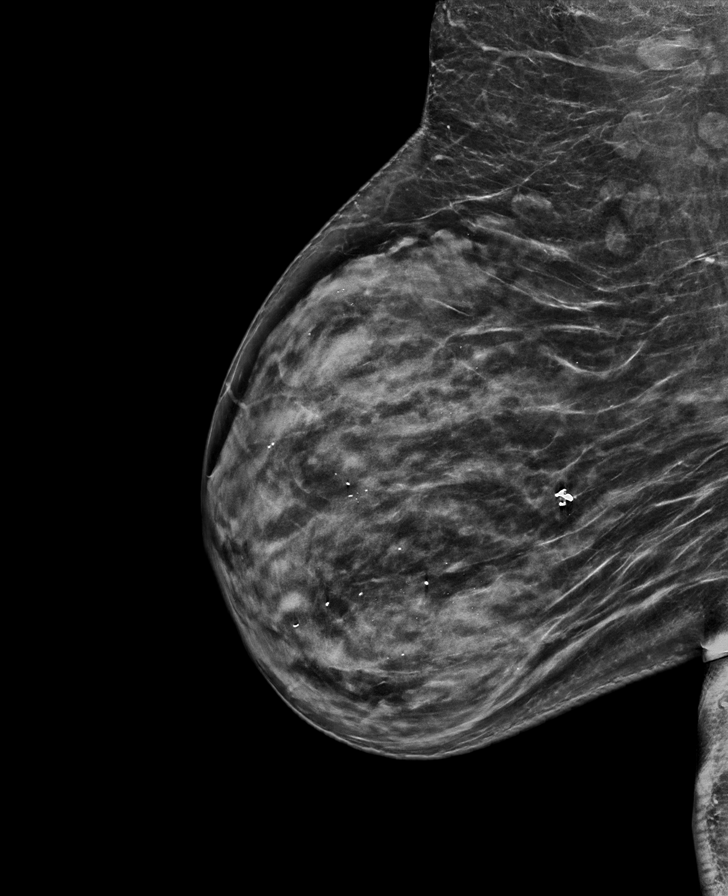

[L MLO tomo · tomo slice 40/79.0]
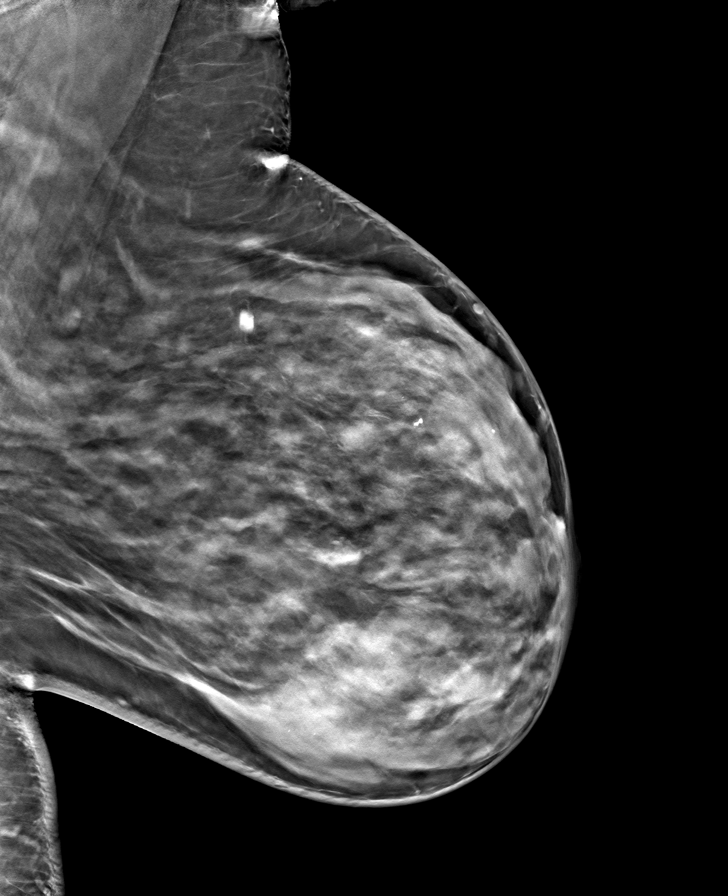

[R CC tomo · tomo slice 33/66.0]
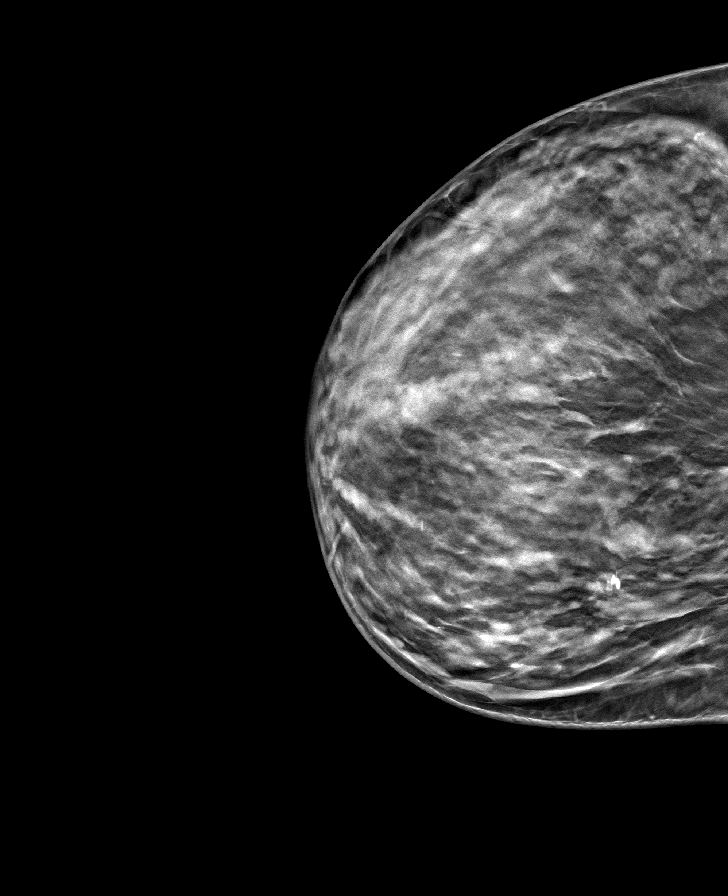

[L CC tomo · tomo slice 33/65.0]
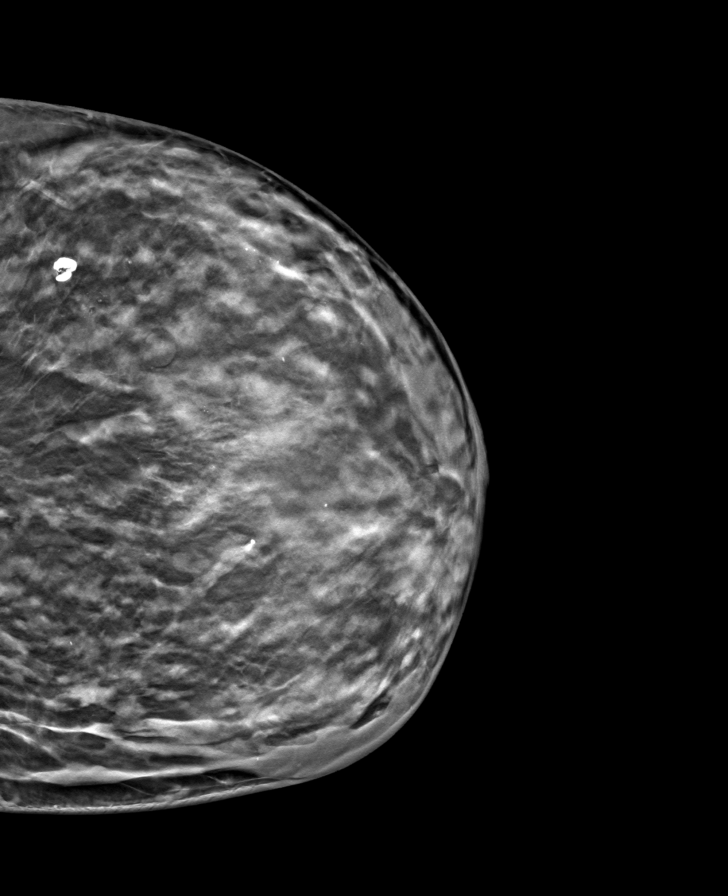

[R MLO tomo · tomo slice 37/73.0]
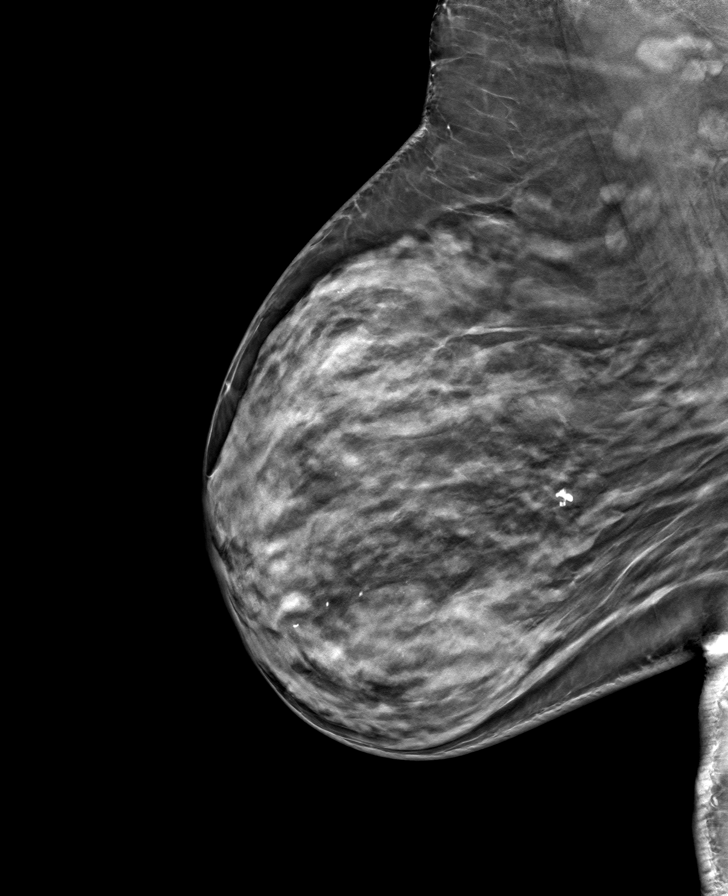

[8 of 24 positions shown; findings below may reference images not displayed]

ACR Breast Density Category c: The breast tissue is heterogeneously
dense, which may obscure small masses.
FINDINGS: There are no findings suspicious for malignancy. Images were
processed with CAD.
IMPRESSION: No mammographic evidence of malignancy. A result letter of this
screening mammogram will be mailed directly to the patient.

RECOMMENDATION:
Screening mammogram in one year. (Code:[5V])

BI-RADS CATEGORY  1: Negative.

## 2020-07-10 DIAGNOSIS — H2511 Age-related nuclear cataract, right eye: Secondary | ICD-10-CM | POA: Diagnosis not present

## 2020-07-10 DIAGNOSIS — H52213 Irregular astigmatism, bilateral: Secondary | ICD-10-CM | POA: Diagnosis not present

## 2020-07-10 DIAGNOSIS — H2589 Other age-related cataract: Secondary | ICD-10-CM | POA: Diagnosis not present

## 2020-07-10 DIAGNOSIS — H25043 Posterior subcapsular polar age-related cataract, bilateral: Secondary | ICD-10-CM | POA: Diagnosis not present

## 2020-07-10 DIAGNOSIS — H25013 Cortical age-related cataract, bilateral: Secondary | ICD-10-CM | POA: Diagnosis not present

## 2020-07-10 DIAGNOSIS — E113393 Type 2 diabetes mellitus with moderate nonproliferative diabetic retinopathy without macular edema, bilateral: Secondary | ICD-10-CM | POA: Diagnosis not present

## 2020-07-10 DIAGNOSIS — H2513 Age-related nuclear cataract, bilateral: Secondary | ICD-10-CM | POA: Diagnosis not present

## 2020-07-10 DIAGNOSIS — H40013 Open angle with borderline findings, low risk, bilateral: Secondary | ICD-10-CM | POA: Diagnosis not present

## 2020-07-15 ENCOUNTER — Telehealth (HOSPITAL_COMMUNITY): Payer: Self-pay

## 2020-07-15 NOTE — Telephone Encounter (Signed)
Today had a telephone visit with Terry Arroyo.  She is doing great she says.  She has cataract surgery scheduled for Aug 24 and is looking forward to it.  She has all her medications and verified them.  She is no longer taking lisinopril and she wants to know how to stop it.  Advised her to contact [facility] and advise them.  Her sugars has been running good she said.  She is now drinking glucerna to help maintain her diabetes.  She watches high sodium foods and watches her fluid intake.  She denies any swelling, chest pain, headaches or dizziness.  She has everything for daily living.  She lives with her husband.  She is not weighing at home due to her eye sight.  She stays active and walks.  She has no complaints today.  Will continue to visit for heart failure.   [place] [phone]

## 2020-08-04 DIAGNOSIS — N1832 Chronic kidney disease, stage 3b: Secondary | ICD-10-CM | POA: Diagnosis not present

## 2020-08-04 DIAGNOSIS — N2581 Secondary hyperparathyroidism of renal origin: Secondary | ICD-10-CM | POA: Diagnosis not present

## 2020-08-04 DIAGNOSIS — E1122 Type 2 diabetes mellitus with diabetic chronic kidney disease: Secondary | ICD-10-CM | POA: Diagnosis not present

## 2020-08-04 DIAGNOSIS — R809 Proteinuria, unspecified: Secondary | ICD-10-CM | POA: Diagnosis not present

## 2020-08-04 DIAGNOSIS — I129 Hypertensive chronic kidney disease with stage 1 through stage 4 chronic kidney disease, or unspecified chronic kidney disease: Secondary | ICD-10-CM | POA: Diagnosis not present

## 2020-08-04 DIAGNOSIS — D631 Anemia in chronic kidney disease: Secondary | ICD-10-CM | POA: Diagnosis not present

## 2020-08-12 DIAGNOSIS — H2511 Age-related nuclear cataract, right eye: Secondary | ICD-10-CM | POA: Diagnosis not present

## 2020-08-12 DIAGNOSIS — H25811 Combined forms of age-related cataract, right eye: Secondary | ICD-10-CM | POA: Diagnosis not present

## 2020-08-12 DIAGNOSIS — H5703 Miosis: Secondary | ICD-10-CM | POA: Diagnosis not present

## 2020-08-14 ENCOUNTER — Other Ambulatory Visit: Payer: Self-pay | Admitting: Nephrology

## 2020-08-14 ENCOUNTER — Other Ambulatory Visit: Payer: Self-pay | Admitting: Radiology

## 2020-08-14 DIAGNOSIS — R809 Proteinuria, unspecified: Secondary | ICD-10-CM

## 2020-08-18 ENCOUNTER — Telehealth (HOSPITAL_COMMUNITY): Payer: Self-pay

## 2020-08-18 NOTE — Telephone Encounter (Signed)
Today contacted Terry Arroyo, she states as far as heart failure she is doing well.  She has a little swelling sometimes but always goes back down.  She stays active.  She is applying to move out to apartment by herself, she is excited.  She has been living with her Dad. She had her cataract surgery last week, went well.  She is still healing and still blurry, they advised will take about 3 weeks to become clear.   She is unaware of her appt this week to have biopsy, she thought it was later in the month.  She states will get a ride there.  She watches high sodium foods and how much fluids she intakes.  She has all her medications and aware of how to take them.  Verified them.  She has not gained weight, she is maintaining around the same. 179 lbs.  When too hot to go walking, she exercises inside.  Will consider for discharge from program after she moves and continues to do well.  Will continue to visit for heart failure.   [place] [phone]

## 2020-08-20 ENCOUNTER — Ambulatory Visit: Payer: Medicare HMO

## 2020-08-27 DIAGNOSIS — I129 Hypertensive chronic kidney disease with stage 1 through stage 4 chronic kidney disease, or unspecified chronic kidney disease: Secondary | ICD-10-CM | POA: Diagnosis not present

## 2020-08-27 DIAGNOSIS — N1832 Chronic kidney disease, stage 3b: Secondary | ICD-10-CM | POA: Diagnosis not present

## 2020-08-29 DIAGNOSIS — Z0100 Encounter for examination of eyes and vision without abnormal findings: Secondary | ICD-10-CM | POA: Diagnosis not present

## 2020-08-29 DIAGNOSIS — H2511 Age-related nuclear cataract, right eye: Secondary | ICD-10-CM | POA: Diagnosis not present

## 2020-08-29 DIAGNOSIS — H5213 Myopia, bilateral: Secondary | ICD-10-CM | POA: Diagnosis not present

## 2020-09-01 ENCOUNTER — Ambulatory Visit: Payer: Medicare HMO | Admitting: Dermatology

## 2020-09-03 DIAGNOSIS — H903 Sensorineural hearing loss, bilateral: Secondary | ICD-10-CM | POA: Insufficient documentation

## 2020-09-03 DIAGNOSIS — G5620 Lesion of ulnar nerve, unspecified upper limb: Secondary | ICD-10-CM | POA: Insufficient documentation

## 2020-09-03 DIAGNOSIS — I671 Cerebral aneurysm, nonruptured: Secondary | ICD-10-CM | POA: Insufficient documentation

## 2020-09-03 DIAGNOSIS — H5440 Blindness, one eye, unspecified eye: Secondary | ICD-10-CM | POA: Insufficient documentation

## 2020-09-03 DIAGNOSIS — D353 Benign neoplasm of craniopharyngeal duct: Secondary | ICD-10-CM | POA: Insufficient documentation

## 2020-09-03 DIAGNOSIS — H5000 Unspecified esotropia: Secondary | ICD-10-CM | POA: Insufficient documentation

## 2020-09-03 DIAGNOSIS — E782 Mixed hyperlipidemia: Secondary | ICD-10-CM | POA: Insufficient documentation

## 2020-09-03 DIAGNOSIS — D352 Benign neoplasm of pituitary gland: Secondary | ICD-10-CM | POA: Insufficient documentation

## 2020-09-03 DIAGNOSIS — H04129 Dry eye syndrome of unspecified lacrimal gland: Secondary | ICD-10-CM | POA: Insufficient documentation

## 2020-09-05 DIAGNOSIS — Z01419 Encounter for gynecological examination (general) (routine) without abnormal findings: Secondary | ICD-10-CM | POA: Diagnosis not present

## 2020-09-05 DIAGNOSIS — M545 Low back pain: Secondary | ICD-10-CM | POA: Diagnosis not present

## 2020-09-05 DIAGNOSIS — R102 Pelvic and perineal pain: Secondary | ICD-10-CM | POA: Diagnosis not present

## 2020-09-09 ENCOUNTER — Other Ambulatory Visit: Payer: Self-pay | Admitting: Radiology

## 2020-09-10 ENCOUNTER — Other Ambulatory Visit: Payer: Self-pay | Admitting: Radiology

## 2020-09-11 ENCOUNTER — Ambulatory Visit
Admission: RE | Admit: 2020-09-11 | Discharge: 2020-09-11 | Disposition: A | Discharge status: Home / Self Care | Payer: Medicare HMO | Source: Ambulatory Visit | Attending: Nephrology | Admitting: Nephrology

## 2020-09-11 ENCOUNTER — Other Ambulatory Visit: Payer: Self-pay

## 2020-09-11 DIAGNOSIS — J45909 Unspecified asthma, uncomplicated: Secondary | ICD-10-CM | POA: Diagnosis present

## 2020-09-11 DIAGNOSIS — I130 Hypertensive heart and chronic kidney disease with heart failure and stage 1 through stage 4 chronic kidney disease, or unspecified chronic kidney disease: Secondary | ICD-10-CM | POA: Diagnosis present

## 2020-09-11 DIAGNOSIS — I129 Hypertensive chronic kidney disease with stage 1 through stage 4 chronic kidney disease, or unspecified chronic kidney disease: Secondary | ICD-10-CM | POA: Insufficient documentation

## 2020-09-11 DIAGNOSIS — Z20822 Contact with and (suspected) exposure to covid-19: Secondary | ICD-10-CM | POA: Diagnosis present

## 2020-09-11 DIAGNOSIS — R310 Gross hematuria: Secondary | ICD-10-CM | POA: Diagnosis present

## 2020-09-11 DIAGNOSIS — R809 Proteinuria, unspecified: Secondary | ICD-10-CM

## 2020-09-11 DIAGNOSIS — I2510 Atherosclerotic heart disease of native coronary artery without angina pectoris: Secondary | ICD-10-CM | POA: Diagnosis present

## 2020-09-11 DIAGNOSIS — Z951 Presence of aortocoronary bypass graft: Secondary | ICD-10-CM | POA: Diagnosis not present

## 2020-09-11 DIAGNOSIS — R319 Hematuria, unspecified: Secondary | ICD-10-CM | POA: Diagnosis not present

## 2020-09-11 DIAGNOSIS — Z8673 Personal history of transient ischemic attack (TIA), and cerebral infarction without residual deficits: Secondary | ICD-10-CM | POA: Diagnosis not present

## 2020-09-11 DIAGNOSIS — S37019A Minor contusion of unspecified kidney, initial encounter: Secondary | ICD-10-CM | POA: Diagnosis present

## 2020-09-11 DIAGNOSIS — E876 Hypokalemia: Secondary | ICD-10-CM | POA: Diagnosis present

## 2020-09-11 DIAGNOSIS — K219 Gastro-esophageal reflux disease without esophagitis: Secondary | ICD-10-CM | POA: Diagnosis present

## 2020-09-11 DIAGNOSIS — D631 Anemia in chronic kidney disease: Secondary | ICD-10-CM | POA: Diagnosis not present

## 2020-09-11 DIAGNOSIS — R109 Unspecified abdominal pain: Secondary | ICD-10-CM | POA: Diagnosis present

## 2020-09-11 DIAGNOSIS — G4700 Insomnia, unspecified: Secondary | ICD-10-CM | POA: Diagnosis present

## 2020-09-11 DIAGNOSIS — H5462 Unqualified visual loss, left eye, normal vision right eye: Secondary | ICD-10-CM | POA: Diagnosis present

## 2020-09-11 DIAGNOSIS — N170 Acute kidney failure with tubular necrosis: Secondary | ICD-10-CM | POA: Diagnosis not present

## 2020-09-11 DIAGNOSIS — E785 Hyperlipidemia, unspecified: Secondary | ICD-10-CM | POA: Diagnosis present

## 2020-09-11 DIAGNOSIS — N1832 Chronic kidney disease, stage 3b: Secondary | ICD-10-CM | POA: Insufficient documentation

## 2020-09-11 DIAGNOSIS — N2581 Secondary hyperparathyroidism of renal origin: Secondary | ICD-10-CM | POA: Diagnosis present

## 2020-09-11 DIAGNOSIS — R600 Localized edema: Secondary | ICD-10-CM | POA: Diagnosis not present

## 2020-09-11 DIAGNOSIS — E237 Disorder of pituitary gland, unspecified: Secondary | ICD-10-CM | POA: Diagnosis present

## 2020-09-11 DIAGNOSIS — N99840 Postprocedural hematoma of a genitourinary system organ or structure following a genitourinary system procedure: Secondary | ICD-10-CM | POA: Diagnosis present

## 2020-09-11 DIAGNOSIS — I5022 Chronic systolic (congestive) heart failure: Secondary | ICD-10-CM | POA: Diagnosis present

## 2020-09-11 DIAGNOSIS — N184 Chronic kidney disease, stage 4 (severe): Secondary | ICD-10-CM | POA: Diagnosis present

## 2020-09-11 DIAGNOSIS — I10 Essential (primary) hypertension: Secondary | ICD-10-CM | POA: Diagnosis not present

## 2020-09-11 DIAGNOSIS — S37011A Minor contusion of right kidney, initial encounter: Secondary | ICD-10-CM | POA: Diagnosis present

## 2020-09-11 DIAGNOSIS — N179 Acute kidney failure, unspecified: Secondary | ICD-10-CM | POA: Diagnosis present

## 2020-09-11 DIAGNOSIS — N1330 Unspecified hydronephrosis: Secondary | ICD-10-CM | POA: Diagnosis present

## 2020-09-11 DIAGNOSIS — D72829 Elevated white blood cell count, unspecified: Secondary | ICD-10-CM | POA: Diagnosis present

## 2020-09-11 DIAGNOSIS — N189 Chronic kidney disease, unspecified: Secondary | ICD-10-CM | POA: Diagnosis not present

## 2020-09-11 DIAGNOSIS — N183 Chronic kidney disease, stage 3 unspecified: Secondary | ICD-10-CM | POA: Diagnosis not present

## 2020-09-11 DIAGNOSIS — S37012A Minor contusion of left kidney, initial encounter: Secondary | ICD-10-CM | POA: Diagnosis not present

## 2020-09-11 DIAGNOSIS — E1122 Type 2 diabetes mellitus with diabetic chronic kidney disease: Secondary | ICD-10-CM | POA: Diagnosis present

## 2020-09-11 DIAGNOSIS — R1310 Dysphagia, unspecified: Secondary | ICD-10-CM | POA: Diagnosis present

## 2020-09-11 LAB — CBC
HCT: 36.6 % (ref 36.0–46.0)
Hemoglobin: 13.1 g/dL (ref 12.0–15.0)
MCH: 29.8 pg (ref 26.0–34.0)
MCHC: 35.8 g/dL (ref 30.0–36.0)
MCV: 83.4 fL (ref 80.0–100.0)
Platelets: 387 10*3/uL (ref 150–400)
RBC: 4.39 MIL/uL (ref 3.87–5.11)
RDW: 14.6 % (ref 11.5–15.5)
WBC: 9.9 10*3/uL (ref 4.0–10.5)
nRBC: 0 % (ref 0.0–0.2)

## 2020-09-11 LAB — PROTIME-INR
INR: 1 (ref 0.8–1.2)
Prothrombin Time: 12.5 seconds (ref 11.4–15.2)

## 2020-09-11 LAB — GLUCOSE, CAPILLARY: Glucose-Capillary: 301 mg/dL — ABNORMAL HIGH (ref 70–99)

## 2020-09-11 IMAGING — US US BIOPSY
1 series · 11 of 11 positions shown · non-contrast
Comparison: none

INDICATION: Chronic kidney disease, proteinuria, hypertension and diabetes. The
patient requires renal biopsy.

[Series 1: us biopsy · 11 of 11 slices shown]
[im 1/11]
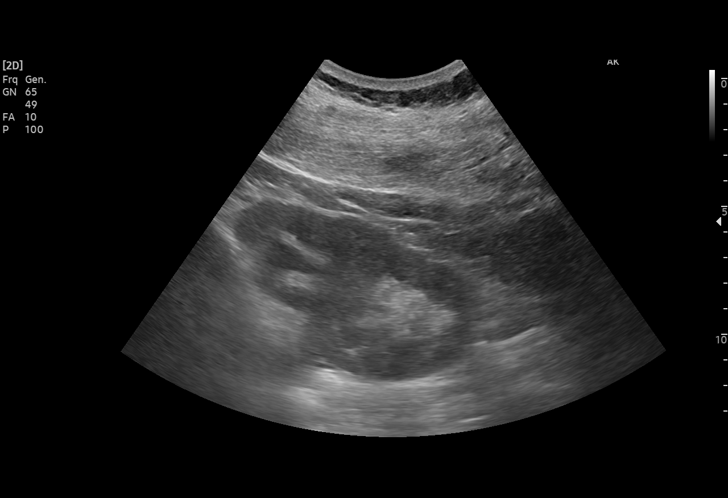
[im 2/11]
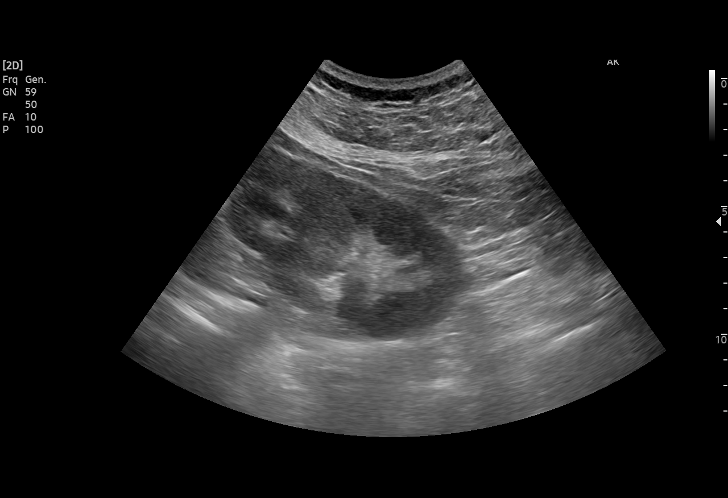
[im 3/11]
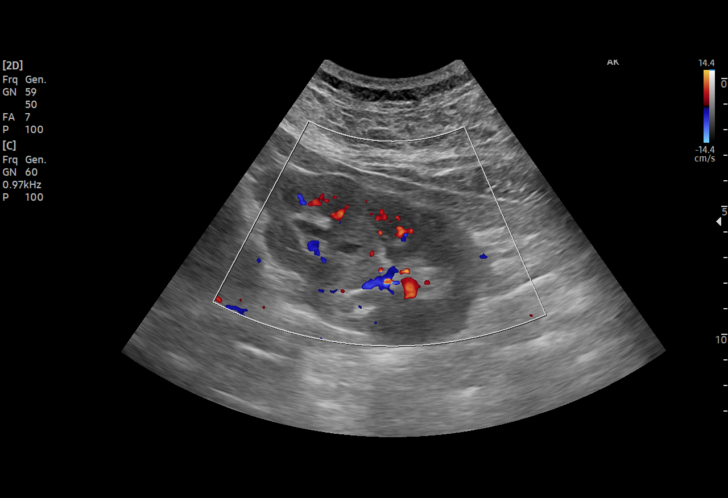
[im 4/11]
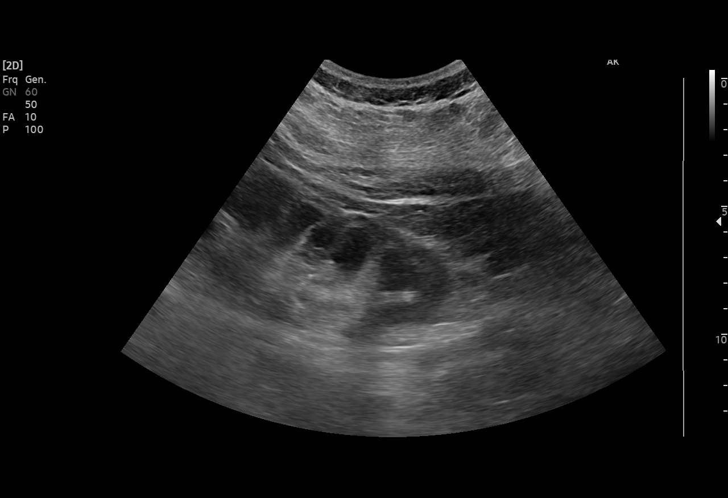
[im 5/11]
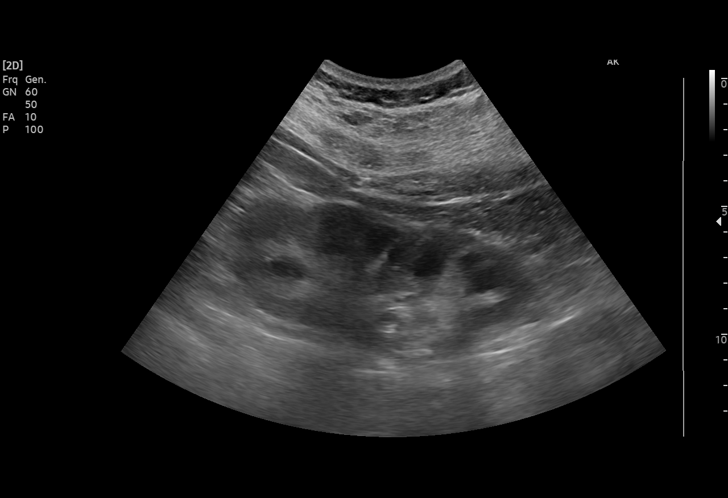
[im 6/11]
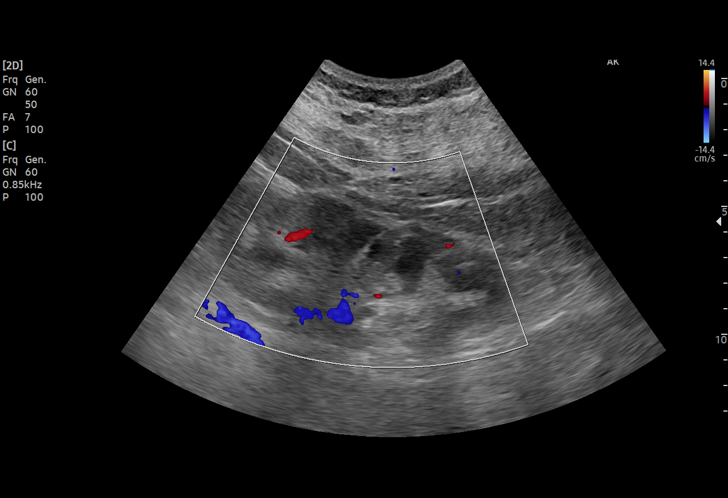
[im 7/11]
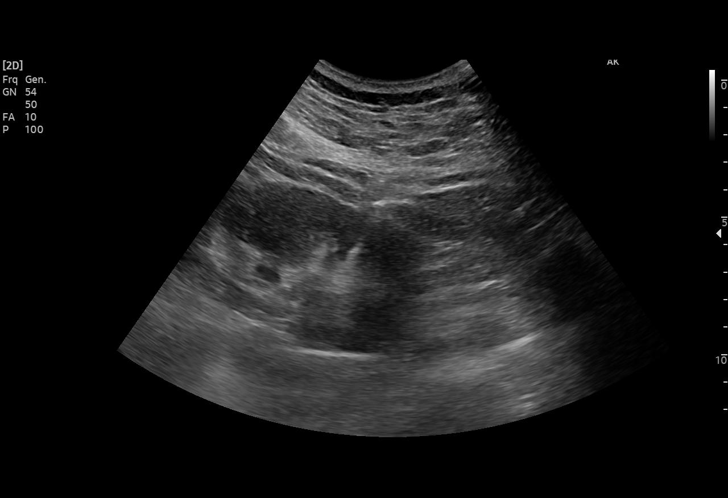
[im 8/11]
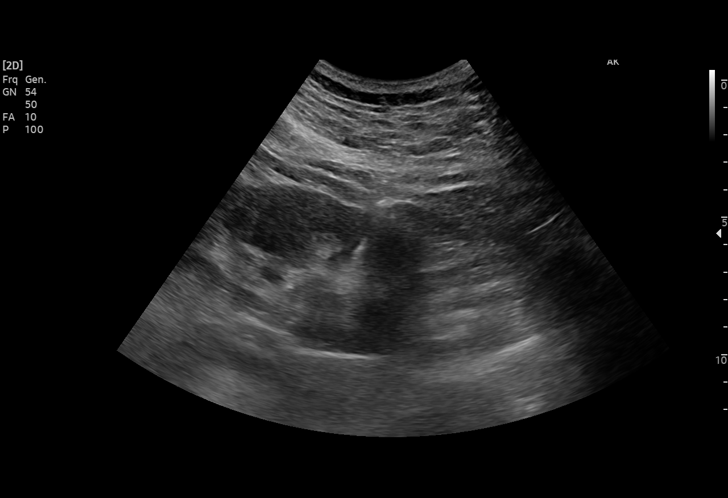
[im 9/11]
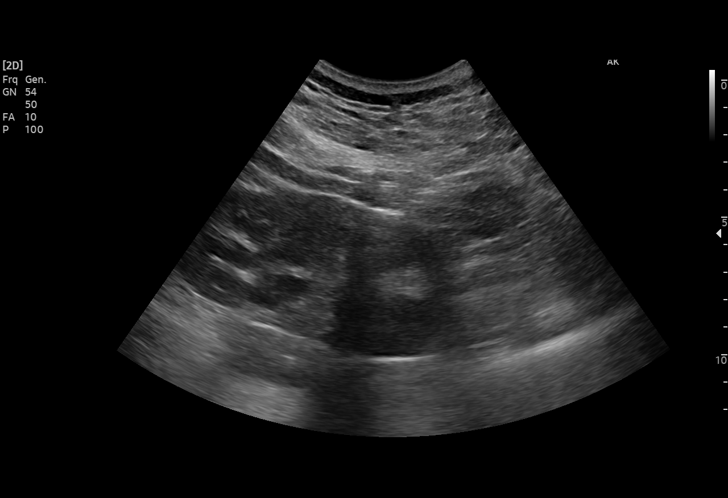
[im 10/11]
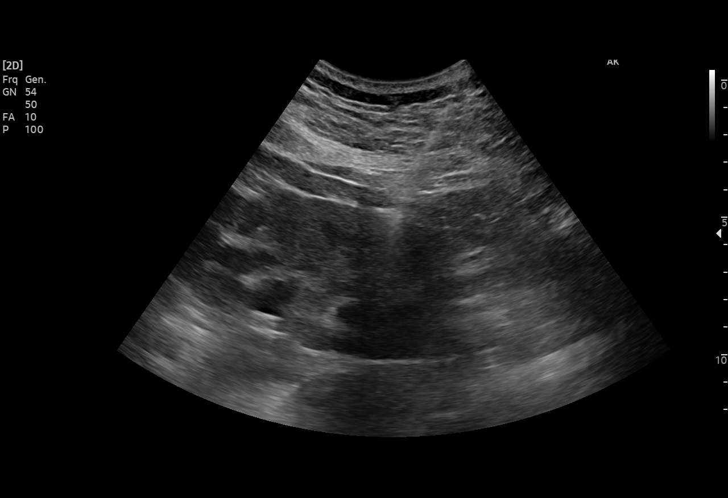
[im 11/11]
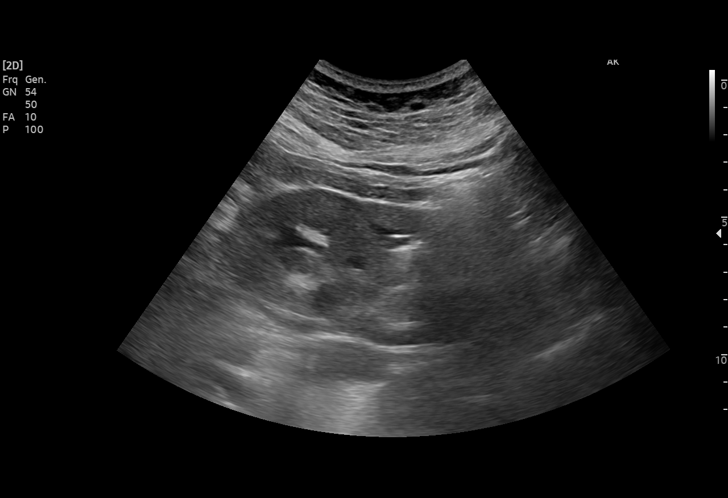

[11 of 11 positions shown; findings below may reference images not displayed]

EXAM:
ULTRASOUND GUIDED CORE BIOPSY OF RIGHT KIDNEY

MEDICATIONS:
None.

ANESTHESIA/SEDATION:
Fentanyl 100 mcg IV; Versed 1.0 mg IV

Moderate Sedation Time:  10 minutes.

The patient was continuously monitored during the procedure by the
interventional radiology nurse under my direct supervision.

PROCEDURE:
The procedure, risks, benefits, and alternatives were explained to
the patient. Questions regarding the procedure were encouraged and
answered. The patient understands and consents to the procedure. A
time-out was performed prior to initiating the procedure.

Ultrasound was performed of both kidneys in a prone position. The
right flank region was prepped with chlorhexidine in a sterile
fashion, and a sterile drape was applied covering the operative
field. A sterile gown and sterile gloves were used for the
procedure. Local anesthesia was provided with 1% Lidocaine.

A 15 gauge trocar needle was advanced to the margin of lower pole
cortex of the right kidney. After confirming needle tip position, 2
separate coaxial 16 gauge core biopsy samples were obtained.
Material was submitted on saline soaked Telfa gauze. A slurry of
Gel-Foam pledgets was injected via the outer needle as the needle
was retracted. Additional ultrasound was performed.

COMPLICATIONS:
None immediate.
FINDINGS: Both kidneys were well-visualized by ultrasound. The left kidney
demonstrates more significant cortical atrophy at the level of the
lower pole compared to the right and the right was therefore chosen
for sampling. Solid core biopsy samples were obtained.
IMPRESSION: Ultrasound-guided core biopsy performed at the level of right lower
pole renal cortex.

## 2020-09-11 MED ORDER — HYDRALAZINE HCL 20 MG/ML IJ SOLN
INTRAMUSCULAR | Status: AC
  Filled 2020-09-11: qty 1

## 2020-09-11 MED ORDER — MIDAZOLAM HCL 2 MG/2ML IJ SOLN
INTRAMUSCULAR | Status: AC
  Filled 2020-09-11: qty 2

## 2020-09-11 MED ORDER — FENTANYL CITRATE (PF) 100 MCG/2ML IJ SOLN
INTRAMUSCULAR | Status: AC
  Filled 2020-09-11: qty 2

## 2020-09-11 MED ORDER — FENTANYL CITRATE (PF) 100 MCG/2ML IJ SOLN
INTRAMUSCULAR | Status: AC | PRN
  Administered 2020-09-11 (×2): 50 ug via INTRAVENOUS

## 2020-09-11 MED ORDER — SODIUM CHLORIDE 0.9 % IV SOLN
INTRAVENOUS | Status: DC
  Administered 2020-09-11: 20 mL via INTRAVENOUS

## 2020-09-11 MED ORDER — CARVEDILOL 12.5 MG PO TABS
12.5000 mg | ORAL_TABLET | Freq: Once | ORAL | Status: AC
  Administered 2020-09-11: 12.5 mg via ORAL
  Filled 2020-09-11: qty 1

## 2020-09-11 MED ORDER — ONDANSETRON HCL 4 MG/2ML IJ SOLN
INTRAMUSCULAR | Status: AC
  Administered 2020-09-11: 4 mg
  Filled 2020-09-11: qty 2

## 2020-09-11 MED ORDER — LABETALOL HCL 5 MG/ML IV SOLN
INTRAVENOUS | Status: AC
  Filled 2020-09-11: qty 4

## 2020-09-11 MED ORDER — LABETALOL HCL 5 MG/ML IV SOLN: 10.0000 mg | Freq: Once | INTRAVENOUS | Status: DC

## 2020-09-11 MED ORDER — MIDAZOLAM HCL 2 MG/2ML IJ SOLN
INTRAMUSCULAR | Status: AC | PRN
  Administered 2020-09-11: 1 mg via INTRAVENOUS

## 2020-09-11 MED ORDER — ONDANSETRON HCL 4 MG/2ML IJ SOLN: 4.0000 mg | Freq: Once | INTRAMUSCULAR | Status: DC

## 2020-09-11 MED ORDER — ISOSORBIDE MONONITRATE ER 30 MG PO TB24
30.0000 mg | ORAL_TABLET | Freq: Once | ORAL | Status: AC
  Administered 2020-09-11: 30 mg via ORAL
  Filled 2020-09-11: qty 1

## 2020-09-11 NOTE — Discharge Instructions (Signed)
Percutaneous Kidney Biopsy, Care After This sheet gives you information about how to care for yourself after your procedure. Your health care provider may also give you more specific instructions. If you have problems or questions, contact your health care provider. What can I expect after the procedure? After the procedure, it is common to have:  Pain or soreness near the biopsy site.  Pink or cloudy urine for 24 hours after the procedure. Follow these instructions at home: Activity  Return to your normal activities as told by your health care provider. Ask your health care provider what activities are safe for you.  If you were given a sedative during the procedure, it can affect you for several hours. Do not drive or operate machinery until your health care provider says that it is safe.  Do not lift anything that is heavier than 10 lb (4.5 kg), or the limit that you are told, until your health care provider says that it is safe.  Avoid activities that take a lot of effort (are strenuous) until your health care provider approves. Most people will have to wait 2 weeks before returning to activities such as exercise or sex. General instructions   Take over-the-counter and prescription medicines only as told by your health care provider.  You may eat and drink after your procedure. Follow instructions from your health care provider about eating or drinking restrictions.  Check your biopsy site every day for signs of infection. Check for: ? More redness, swelling, or pain. ? Fluid or blood. ? Warmth. ? Pus or a bad smell.  Keep all follow-up visits as told by your health care provider. This is important. Contact a health care provider if:  You have more redness, swelling, or pain around your biopsy site.  You have fluid or blood coming from your biopsy site.  Your biopsy site feels warm to the touch.  You have pus or a bad smell coming from your biopsy site.  You have blood  in your urine more than 24 hours after your procedure.  You have a fever. Get help right away if:  Your urine is dark red or [place].  You cannot urinate.  It burns when you urinate.  You feel dizzy or light-headed.  You have severe pain in your abdomen or side. Summary  After the procedure, it is common to have pain or soreness at the biopsy site and pink or cloudy urine for the first 24 hours.  Check your biopsy site each day for signs of infection, such as more redness, swelling, or pain; fluid, blood, pus or a bad smell coming from the biopsy site; or the biopsy site feeling warm to touch.  Return to your normal activities as told by your health care provider. This information is not intended to replace advice given to you by your health care provider. Make sure you discuss any questions you have with your health care provider. Document Revised: 08/09/2019 Document Reviewed: 08/09/2019 Elsevier Patient Education  [place]. Muscle Biopsy, Care After This sheet gives you information about how to care for yourself after your procedure. Your health care provider may also give you more specific instructions. If you have problems or questions, contact your health care provider. What can I expect after the procedure? After your procedure, it is common to have soreness and tenderness at the site of the biopsy. This may last for a few days. Follow these instructions at home: Biopsy site care   Follow instructions from  your health care provider about how to take care of your biopsy site. Make sure you: ? Wash your hands with soap and water before and after you change your bandage (dressing). If soap and water are not available, use hand sanitizer. ? Change your dressing as told by your health care provider. ? Leave stitches (sutures), skin glue, or adhesive strips in place. These skin closures may need to stay in place for 2 weeks or longer. If adhesive strip edges start to  loosen and curl up, you may trim the loose edges. Do not remove adhesive strips completely unless your health care provider tells you to do that.  Check your biopsy site every day for signs of infection. Check for: ? Redness, swelling, or pain. ? Fluid or blood. ? Warmth. ? Pus or a bad smell. Activity  Do not drive for 24 hours if you were given a sedative during your procedure.  Return to your normal activities as told by your health care provider. Ask your health care provider what activities are safe for you. General instructions  Take over-the-counter and prescription medicines only as told by your health care provider.  Do not take baths, swim, or use a hot tub until your health care provider approves. Ask your health care provider if you may take showers. You may only be allowed to take sponge baths.  Keep all follow-up visits as told by your health care provider. This is important. Contact a health care provider if:  You have redness, swelling, or pain around your biopsy site.  You have fluid or blood coming from your biopsy site.  Your biopsy site feels warm to the touch.  You have pus or a bad smell coming from your biopsy site.  You have a fever.  You are light-headed or you feel faint. Get help right away if you:  Develop a rash.  Have trouble breathing.  Have numbness or tingling going down the arm or leg of your biopsy site. Summary  After your procedure, it is common to have soreness and tenderness at the site of the biopsy.  Wash your hands before and after you change your dressing. Leave stitches, skin glue, and adhesive strips in place.  Return to your normal activities as told by your health care provider.  Contact a health care provider if you have redness, swelling, or pain around your biopsy site.  Get help right away if you develop a rash, have trouble breathing, or have numbness and tingling down your arm or leg. This information is not  intended to replace advice given to you by your health care provider. Make sure you discuss any questions you have with your health care provider. Document Revised: 10/31/2018 Document Reviewed: 10/31/2018 Elsevier Patient Education  2020 [employer].

## 2020-09-11 NOTE — Progress Notes (Signed)
Pt voided and gross hematuria present. Dr Laural Roes informed. Pt to stay for another hour, push fluids and void again to see if urine cleared.

## 2020-09-11 NOTE — Procedures (Signed)
Interventional Radiology Procedure Note  Procedure: US Guided Biopsy of right kidney  Complications: None  Estimated Blood Loss: < 10 mL  Findings: 16 G core biopsy of right lower pole renal cortex performed under US guidance.  Two core samples obtained and sent to Pathology.  Venetia Night. Kathlene Cote, M.D Pager:  [phone]

## 2020-09-11 NOTE — Progress Notes (Signed)
Pt drank 3 bottles of water. Mild hematuria noted with void. MD aware. Pt to be d/c.

## 2020-09-11 NOTE — Progress Notes (Signed)
Patient clinically stable post Kidney biopsy, tolerated well. Received Fentanyl 100 mcg IV along with Versed 1mg  IV for procedure. Denies complaints post procedure. Awake/alert and oriented post procedure. Vitals stable pre and post procedure. Report given to [place] in specials post procedure.

## 2020-09-11 NOTE — Progress Notes (Signed)
Pt given a sandwich tray and a ginger ale.

## 2020-09-12 ENCOUNTER — Encounter: Payer: Self-pay | Admitting: Emergency Medicine

## 2020-09-12 ENCOUNTER — Inpatient Hospital Stay
Admission: EM | Admit: 2020-09-12 | Discharge: 2020-09-15 | DRG: 920 | Disposition: A | Discharge status: Home / Self Care | Payer: Medicare HMO | Attending: Internal Medicine | Admitting: Internal Medicine

## 2020-09-12 ENCOUNTER — Emergency Department: Payer: Medicare HMO

## 2020-09-12 ENCOUNTER — Other Ambulatory Visit: Payer: Self-pay

## 2020-09-12 ENCOUNTER — Observation Stay: Payer: Medicare HMO

## 2020-09-12 DIAGNOSIS — S37011A Minor contusion of right kidney, initial encounter: Secondary | ICD-10-CM | POA: Diagnosis present

## 2020-09-12 DIAGNOSIS — Z7951 Long term (current) use of inhaled steroids: Secondary | ICD-10-CM

## 2020-09-12 DIAGNOSIS — E1122 Type 2 diabetes mellitus with diabetic chronic kidney disease: Secondary | ICD-10-CM | POA: Diagnosis present

## 2020-09-12 DIAGNOSIS — Z87891 Personal history of nicotine dependence: Secondary | ICD-10-CM

## 2020-09-12 DIAGNOSIS — R6883 Chills (without fever): Secondary | ICD-10-CM | POA: Diagnosis present

## 2020-09-12 DIAGNOSIS — K219 Gastro-esophageal reflux disease without esophagitis: Secondary | ICD-10-CM | POA: Diagnosis present

## 2020-09-12 DIAGNOSIS — N184 Chronic kidney disease, stage 4 (severe): Secondary | ICD-10-CM | POA: Diagnosis present

## 2020-09-12 DIAGNOSIS — Z794 Long term (current) use of insulin: Secondary | ICD-10-CM

## 2020-09-12 DIAGNOSIS — N99840 Postprocedural hematoma of a genitourinary system organ or structure following a genitourinary system procedure: Principal | ICD-10-CM | POA: Diagnosis present

## 2020-09-12 DIAGNOSIS — N179 Acute kidney failure, unspecified: Secondary | ICD-10-CM | POA: Diagnosis present

## 2020-09-12 DIAGNOSIS — E785 Hyperlipidemia, unspecified: Secondary | ICD-10-CM | POA: Diagnosis present

## 2020-09-12 DIAGNOSIS — D72829 Elevated white blood cell count, unspecified: Secondary | ICD-10-CM | POA: Diagnosis present

## 2020-09-12 DIAGNOSIS — I130 Hypertensive heart and chronic kidney disease with heart failure and stage 1 through stage 4 chronic kidney disease, or unspecified chronic kidney disease: Secondary | ICD-10-CM | POA: Diagnosis present

## 2020-09-12 DIAGNOSIS — R1310 Dysphagia, unspecified: Secondary | ICD-10-CM | POA: Diagnosis present

## 2020-09-12 DIAGNOSIS — E237 Disorder of pituitary gland, unspecified: Secondary | ICD-10-CM | POA: Diagnosis present

## 2020-09-12 DIAGNOSIS — Z8249 Family history of ischemic heart disease and other diseases of the circulatory system: Secondary | ICD-10-CM

## 2020-09-12 DIAGNOSIS — Z888 Allergy status to other drugs, medicaments and biological substances status: Secondary | ICD-10-CM

## 2020-09-12 DIAGNOSIS — N183 Chronic kidney disease, stage 3 unspecified: Secondary | ICD-10-CM

## 2020-09-12 DIAGNOSIS — Z8673 Personal history of transient ischemic attack (TIA), and cerebral infarction without residual deficits: Secondary | ICD-10-CM

## 2020-09-12 DIAGNOSIS — Z7982 Long term (current) use of aspirin: Secondary | ICD-10-CM

## 2020-09-12 DIAGNOSIS — R319 Hematuria, unspecified: Secondary | ICD-10-CM | POA: Diagnosis not present

## 2020-09-12 DIAGNOSIS — J45909 Unspecified asthma, uncomplicated: Secondary | ICD-10-CM | POA: Diagnosis present

## 2020-09-12 DIAGNOSIS — D631 Anemia in chronic kidney disease: Secondary | ICD-10-CM | POA: Diagnosis present

## 2020-09-12 DIAGNOSIS — I160 Hypertensive urgency: Secondary | ICD-10-CM | POA: Diagnosis present

## 2020-09-12 DIAGNOSIS — Z951 Presence of aortocoronary bypass graft: Secondary | ICD-10-CM

## 2020-09-12 DIAGNOSIS — I252 Old myocardial infarction: Secondary | ICD-10-CM

## 2020-09-12 DIAGNOSIS — Z91013 Allergy to seafood: Secondary | ICD-10-CM

## 2020-09-12 DIAGNOSIS — R310 Gross hematuria: Secondary | ICD-10-CM | POA: Diagnosis present

## 2020-09-12 DIAGNOSIS — R112 Nausea with vomiting, unspecified: Secondary | ICD-10-CM | POA: Diagnosis present

## 2020-09-12 DIAGNOSIS — Z803 Family history of malignant neoplasm of breast: Secondary | ICD-10-CM

## 2020-09-12 DIAGNOSIS — H5462 Unqualified visual loss, left eye, normal vision right eye: Secondary | ICD-10-CM | POA: Diagnosis present

## 2020-09-12 DIAGNOSIS — N1330 Unspecified hydronephrosis: Secondary | ICD-10-CM | POA: Diagnosis not present

## 2020-09-12 DIAGNOSIS — Z91048 Other nonmedicinal substance allergy status: Secondary | ICD-10-CM

## 2020-09-12 DIAGNOSIS — S37019A Minor contusion of unspecified kidney, initial encounter: Secondary | ICD-10-CM | POA: Diagnosis present

## 2020-09-12 DIAGNOSIS — S37012A Minor contusion of left kidney, initial encounter: Secondary | ICD-10-CM | POA: Diagnosis not present

## 2020-09-12 DIAGNOSIS — Z20822 Contact with and (suspected) exposure to covid-19: Secondary | ICD-10-CM | POA: Diagnosis present

## 2020-09-12 DIAGNOSIS — R600 Localized edema: Secondary | ICD-10-CM | POA: Diagnosis not present

## 2020-09-12 DIAGNOSIS — Z79899 Other long term (current) drug therapy: Secondary | ICD-10-CM

## 2020-09-12 DIAGNOSIS — N2581 Secondary hyperparathyroidism of renal origin: Secondary | ICD-10-CM | POA: Diagnosis present

## 2020-09-12 DIAGNOSIS — I5022 Chronic systolic (congestive) heart failure: Secondary | ICD-10-CM | POA: Diagnosis present

## 2020-09-12 DIAGNOSIS — N9489 Other specified conditions associated with female genital organs and menstrual cycle: Secondary | ICD-10-CM | POA: Diagnosis present

## 2020-09-12 DIAGNOSIS — I2510 Atherosclerotic heart disease of native coronary artery without angina pectoris: Secondary | ICD-10-CM | POA: Diagnosis present

## 2020-09-12 DIAGNOSIS — E876 Hypokalemia: Secondary | ICD-10-CM | POA: Diagnosis present

## 2020-09-12 DIAGNOSIS — G4700 Insomnia, unspecified: Secondary | ICD-10-CM | POA: Diagnosis present

## 2020-09-12 DIAGNOSIS — R109 Unspecified abdominal pain: Secondary | ICD-10-CM | POA: Diagnosis present

## 2020-09-12 DIAGNOSIS — Z9109 Other allergy status, other than to drugs and biological substances: Secondary | ICD-10-CM

## 2020-09-12 DIAGNOSIS — I158 Other secondary hypertension: Secondary | ICD-10-CM | POA: Diagnosis present

## 2020-09-12 LAB — CBC WITH DIFFERENTIAL/PLATELET
Abs Immature Granulocytes: 0.04 10*3/uL (ref 0.00–0.07)
Basophils Absolute: 0 10*3/uL (ref 0.0–0.1)
Basophils Relative: 0 %
Eosinophils Absolute: 0.4 10*3/uL (ref 0.0–0.5)
Eosinophils Relative: 3 %
HCT: 32.3 % — ABNORMAL LOW (ref 36.0–46.0)
Hemoglobin: 11.3 g/dL — ABNORMAL LOW (ref 12.0–15.0)
Immature Granulocytes: 0 %
Lymphocytes Relative: 14 %
Lymphs Abs: 1.6 10*3/uL (ref 0.7–4.0)
MCH: 29.7 pg (ref 26.0–34.0)
MCHC: 35 g/dL (ref 30.0–36.0)
MCV: 85 fL (ref 80.0–100.0)
Monocytes Absolute: 0.7 10*3/uL (ref 0.1–1.0)
Monocytes Relative: 7 %
Neutro Abs: 8.5 10*3/uL — ABNORMAL HIGH (ref 1.7–7.7)
Neutrophils Relative %: 76 %
Platelets: 342 10*3/uL (ref 150–400)
RBC: 3.8 MIL/uL — ABNORMAL LOW (ref 3.87–5.11)
RDW: 14.9 % (ref 11.5–15.5)
WBC: 11.2 10*3/uL — ABNORMAL HIGH (ref 4.0–10.5)
nRBC: 0 % (ref 0.0–0.2)

## 2020-09-12 LAB — RESPIRATORY PANEL BY RT PCR (FLU A&B, COVID)
Influenza A by PCR: NEGATIVE
Influenza B by PCR: NEGATIVE
SARS Coronavirus 2 by RT PCR: NEGATIVE

## 2020-09-12 LAB — COMPREHENSIVE METABOLIC PANEL
ALT: 10 U/L (ref 0–44)
AST: 13 U/L — ABNORMAL LOW (ref 15–41)
Albumin: 2.3 g/dL — ABNORMAL LOW (ref 3.5–5.0)
Alkaline Phosphatase: 74 U/L (ref 38–126)
Anion gap: 11 (ref 5–15)
BUN: 24 mg/dL — ABNORMAL HIGH (ref 6–20)
CO2: 24 mmol/L (ref 22–32)
Calcium: 8.3 mg/dL — ABNORMAL LOW (ref 8.9–10.3)
Chloride: 100 mmol/L (ref 98–111)
Creatinine, Ser: 3.52 mg/dL — ABNORMAL HIGH (ref 0.44–1.00)
GFR calc Af Amer: 16 mL/min — ABNORMAL LOW (ref >60)
GFR calc non Af Amer: 14 mL/min — ABNORMAL LOW (ref >60)
Glucose, Bld: 290 mg/dL — ABNORMAL HIGH (ref 70–99)
Potassium: 3.3 mmol/L — ABNORMAL LOW (ref 3.5–5.1)
Sodium: 135 mmol/L (ref 135–145)
Total Bilirubin: 0.6 mg/dL (ref 0.3–1.2)
Total Protein: 6.4 g/dL — ABNORMAL LOW (ref 6.5–8.1)

## 2020-09-12 LAB — TYPE AND SCREEN
ABO/RH(D): AB POS
Antibody Screen: NEGATIVE

## 2020-09-12 LAB — URINALYSIS, COMPLETE (UACMP) WITH MICROSCOPIC
RBC / HPF: >50 RBC/hpf — ABNORMAL HIGH (ref 0–5)
Specific Gravity, Urine: 1.011 (ref 1.005–1.030)

## 2020-09-12 LAB — LIPASE, BLOOD: Lipase: 30 U/L (ref 11–51)

## 2020-09-12 IMAGING — US US ABDOMEN COMPLETE
1 series · 13 of 25 positions shown · non-contrast
Comparison: Noncontrast abdominal CT [DATE]

CLINICAL DATA: Ultrasound guided right renal biopsy yesterday.
Diffuse abdominal and right flank pain.

EXAM:
ABDOMEN ULTRASOUND COMPLETE
ABDOMEN PELVIS LIMITED

[Series 1: us abdomen complete · 89 acquisitions, 13 frames shown]
[im 1/89]
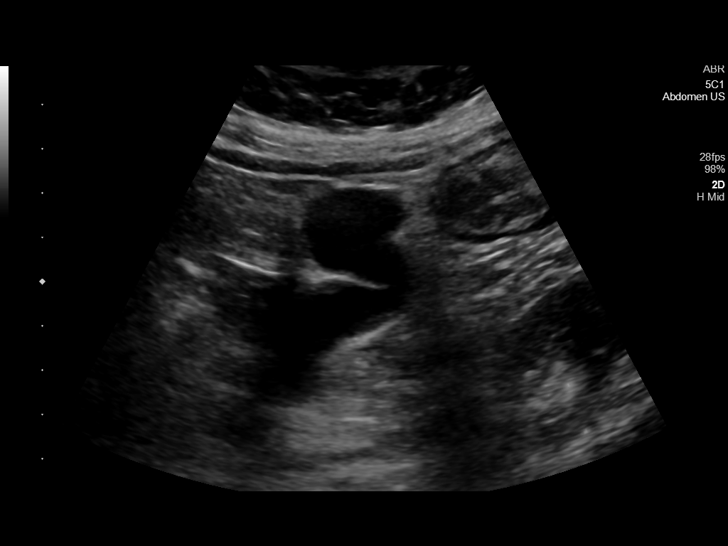
[im 8/89]
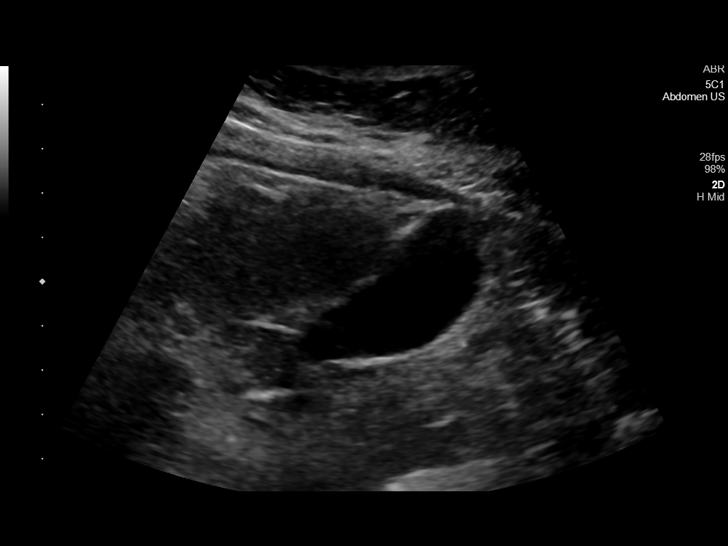
[im 15/89]
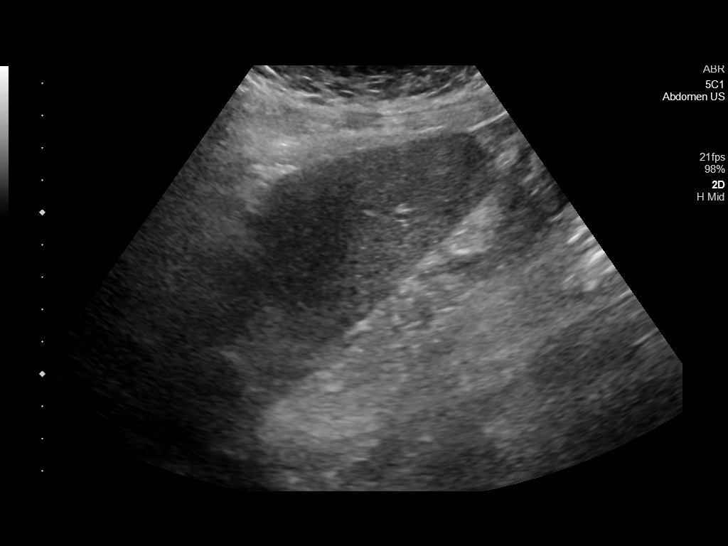
[im 23/89]
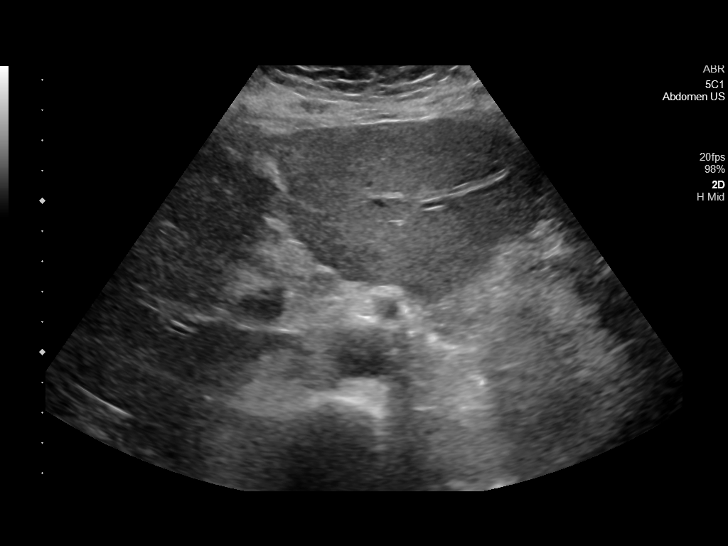
[im 30/89]
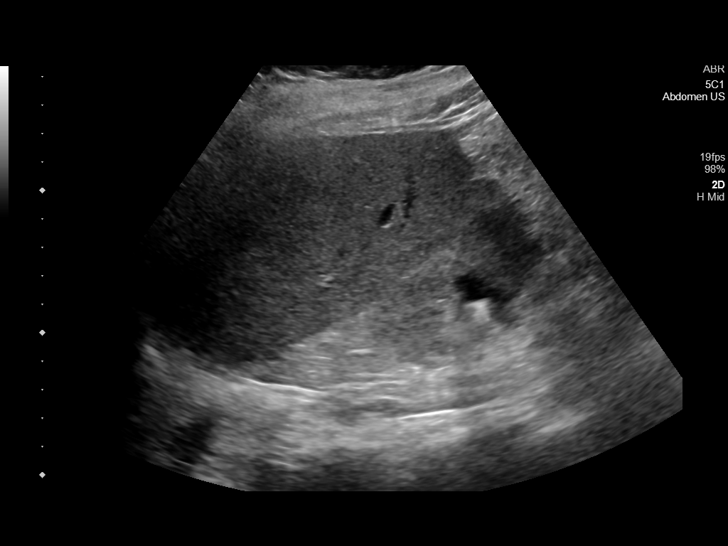
[im 37/89]
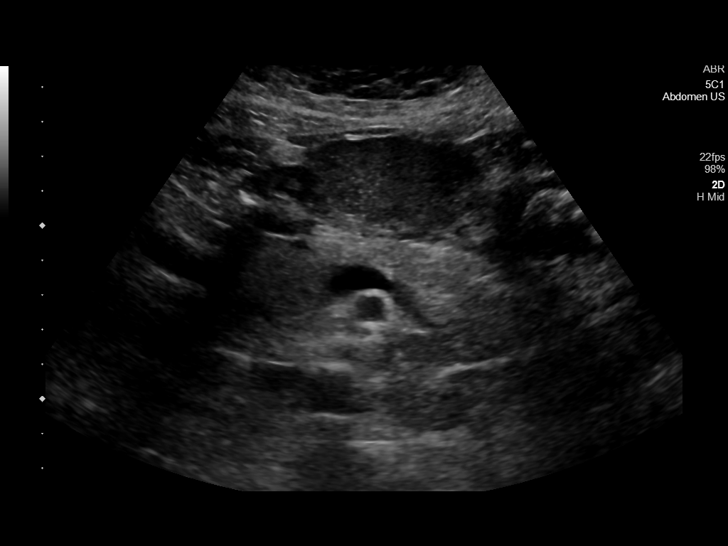
[im 45/89]
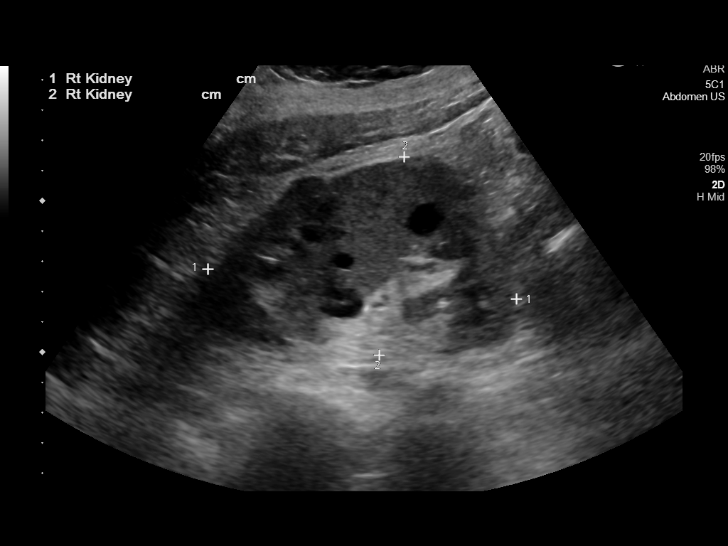
[im 52/89]
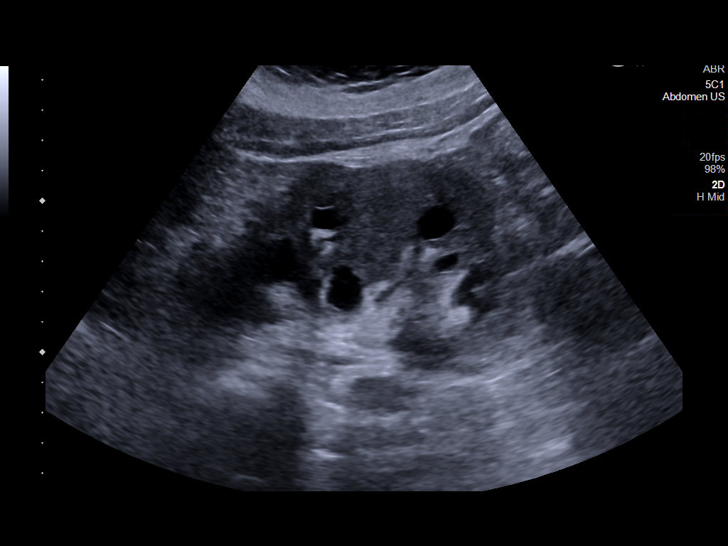
[im 59/89]
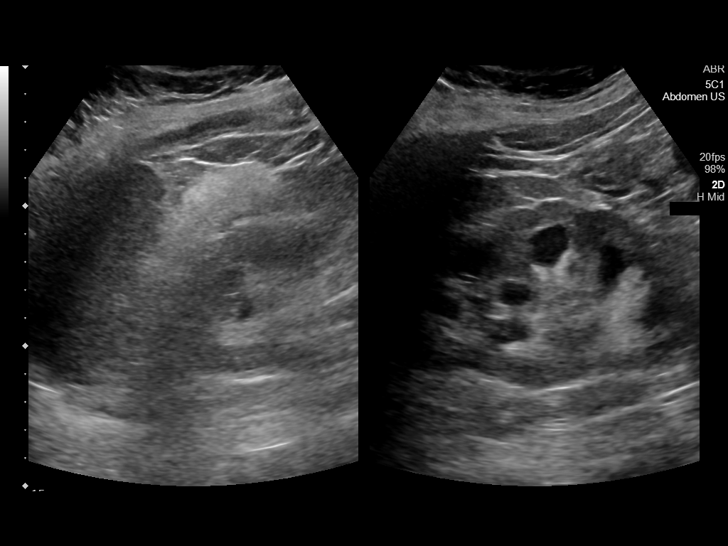
[im 67/89]
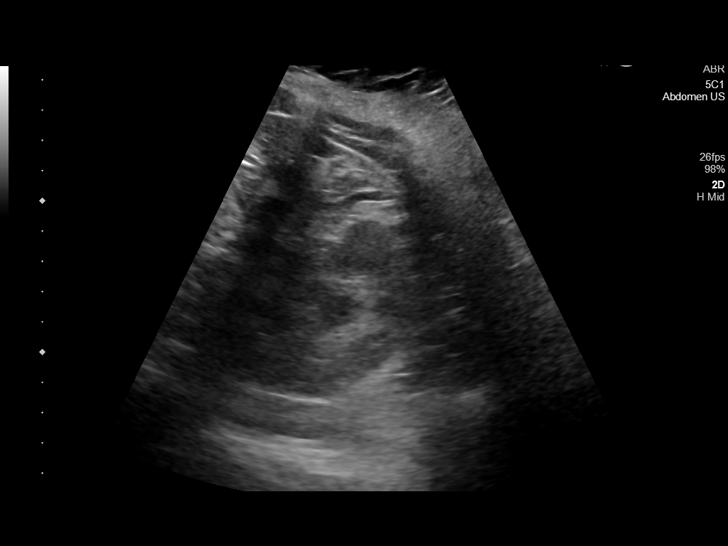
[im 74/89]
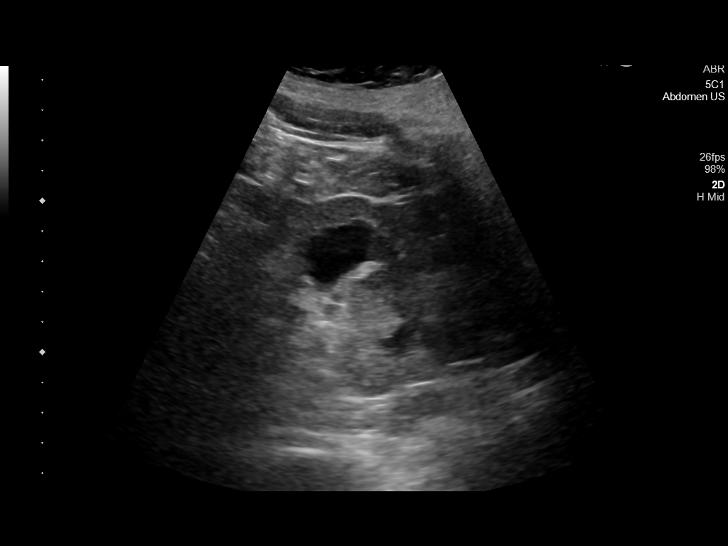
[im 81/89]
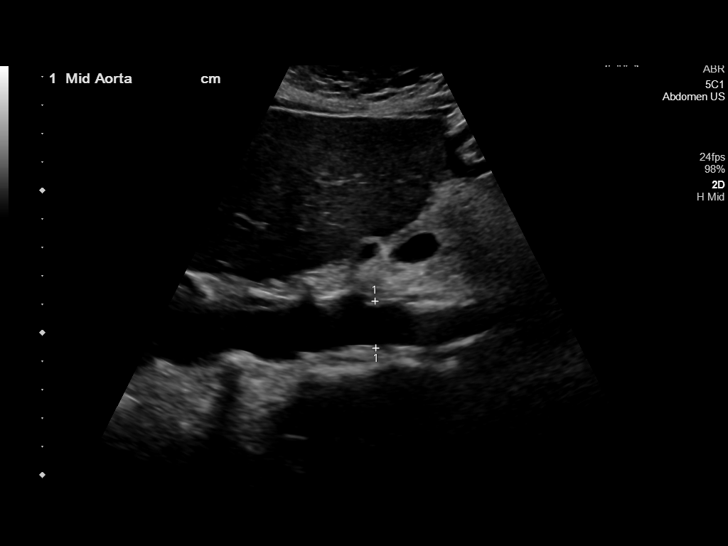
[im 89/89]
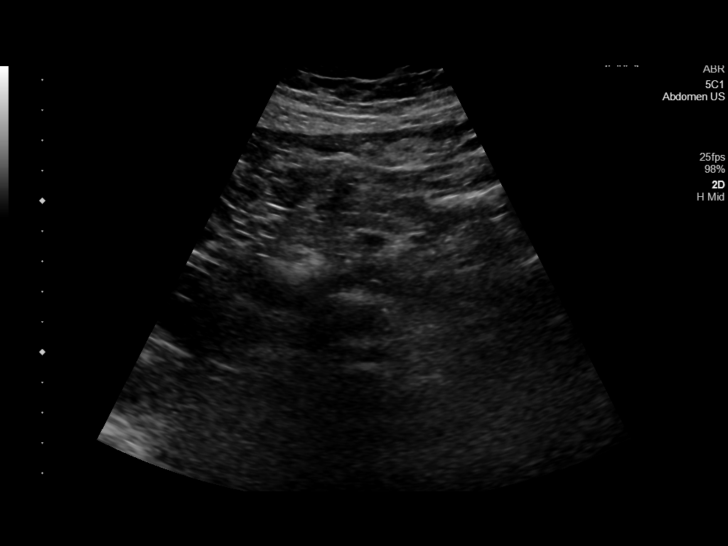

[13 of 25 positions shown; findings below may reference images not displayed]

FINDINGS: Abdomen:

Gallbladder: Physiologically distended. No gallstones or wall
thickening visualized. No sonographic Murphy sign noted by
sonographer.

Common bile duct: Diameter: 3 mm, normal.

Liver: No focal lesion identified. Heterogeneously increased in
parenchymal echogenicity. No perihepatic fluid collection or
sonographic evidence of hepatic injury. Portal vein is patent on
color Doppler imaging with normal direction of blood flow towards
the liver.

IVC: No abnormality visualized.

Pancreas: Visualized portion unremarkable. No peripancreatic
collection visualized by ultrasound.

Spleen: Size and appearance within normal limits.

Right Kidney: Length: 10.3 cm. No evidence of perinephric hematoma
post recent biopsy. Slight prominence of the renal calices without
renal pelvis dilatation. No focal renal abnormality.

Left Kidney: Length: 10.8 cm. Mild calyceal dilatation without renal
pelvis distension or frank hydronephrosis. No focal renal
abnormality.

Abdominal aorta: No aneurysm visualized.  Atherosclerotic plaque.

Other findings: No visualized free fluid or ascites.

Pelvis:

Bladder: Physiologically distended without wall thickening or
intraluminal debris.

Other findings: No pelvic ascites.
IMPRESSION: 1. No sonographic evidence of complication post right renal biopsy
yesterday. No evidence perinephric hematoma. If there is persistent
clinical concern for post biopsy injury, recommend further
evaluation with CT.
2. No evidence of peripancreatic inflammation by ultrasound.
3. No abdominal or pelvic ascites.
4. No debris in the urinary bladder.

## 2020-09-12 IMAGING — US US PELVIS LIMITED
1 series · 13 of 25 positions shown · non-contrast
Comparison: Noncontrast abdominal CT [DATE]

CLINICAL DATA: Ultrasound guided right renal biopsy yesterday.
Diffuse abdominal and right flank pain.

EXAM:
ABDOMEN ULTRASOUND COMPLETE
ABDOMEN PELVIS LIMITED

[Series 1: us abdomen complete · 89 acquisitions, 13 frames shown]
[im 1/89]
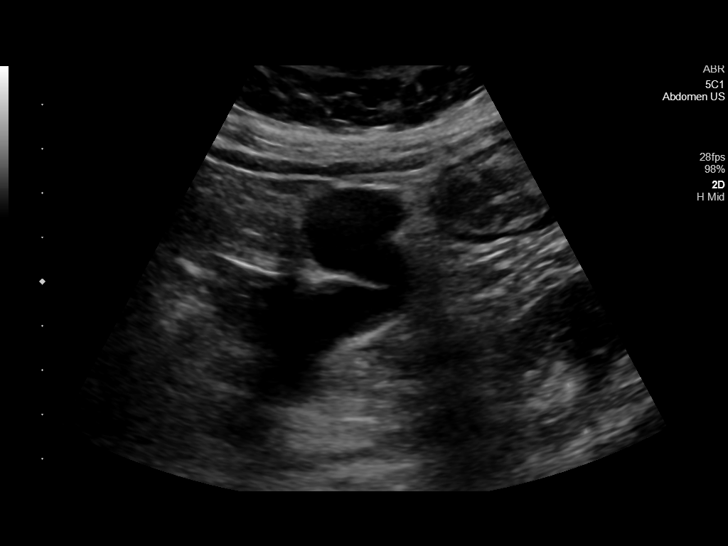
[im 8/89]
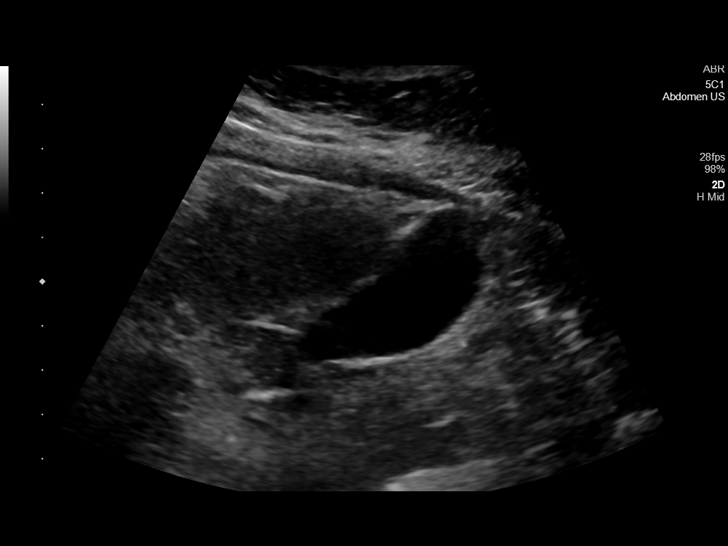
[im 15/89]
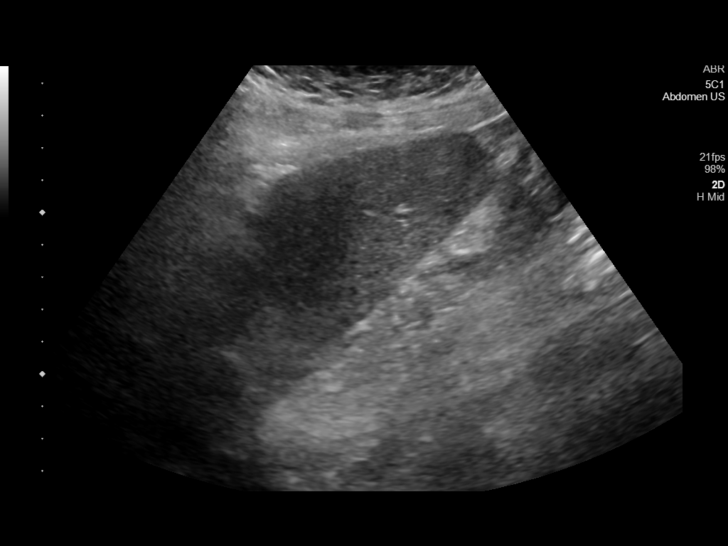
[im 23/89]
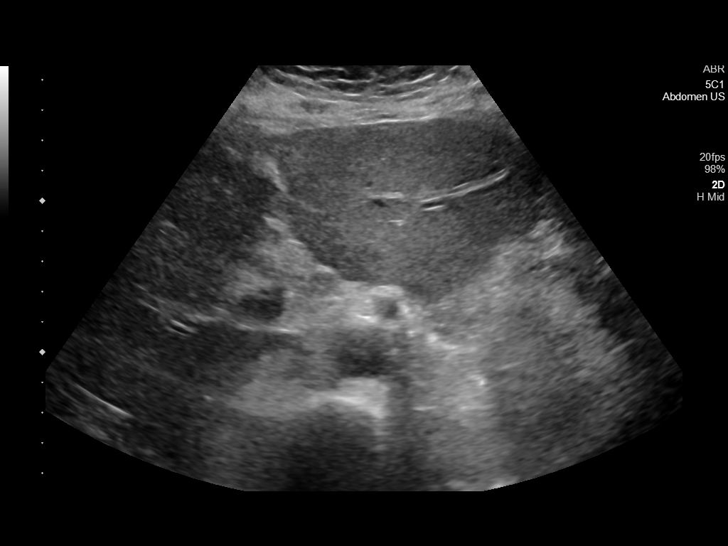
[im 30/89]
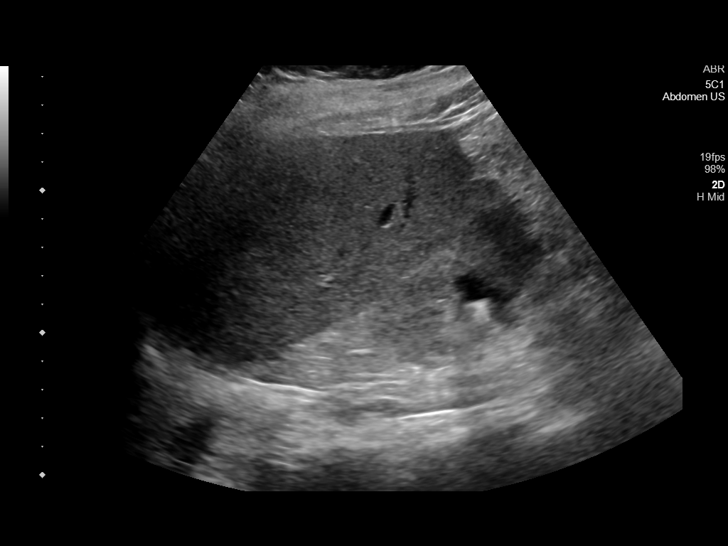
[im 37/89]
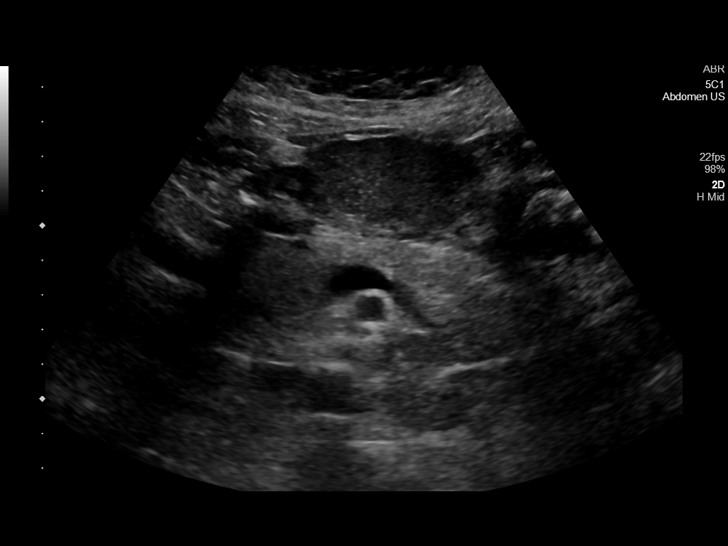
[im 45/89]
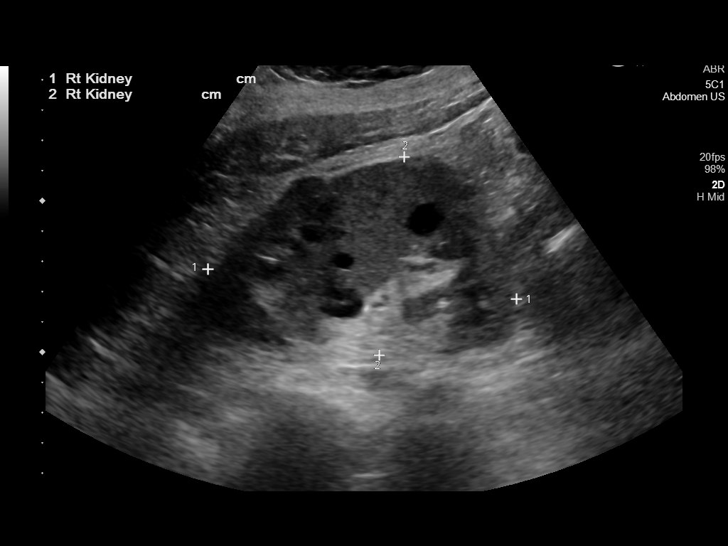
[im 52/89]
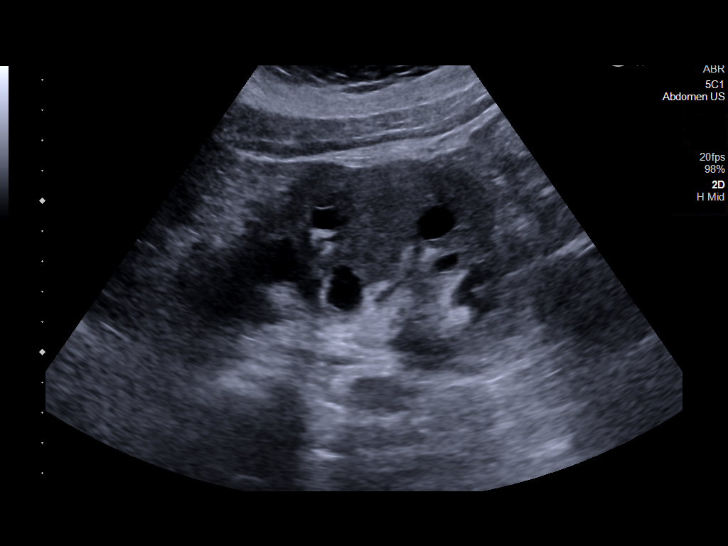
[im 59/89]
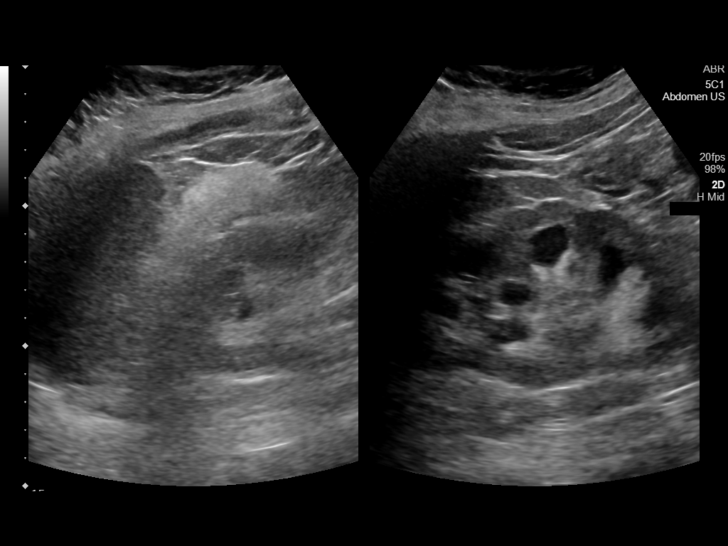
[im 67/89]
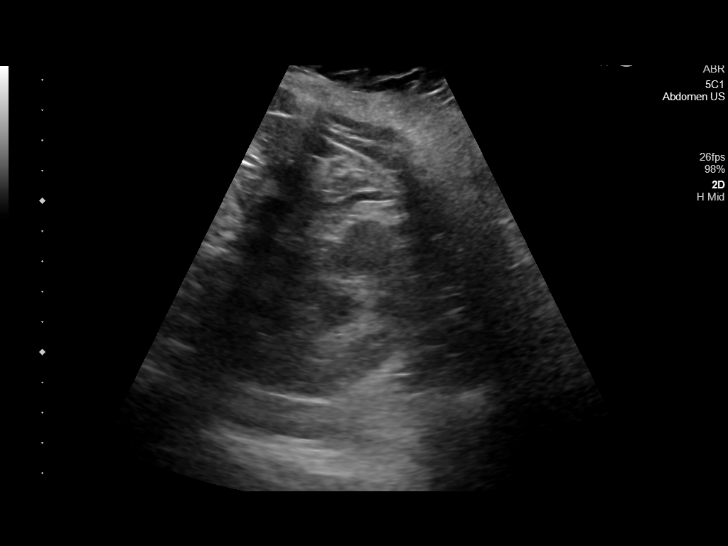
[im 74/89]
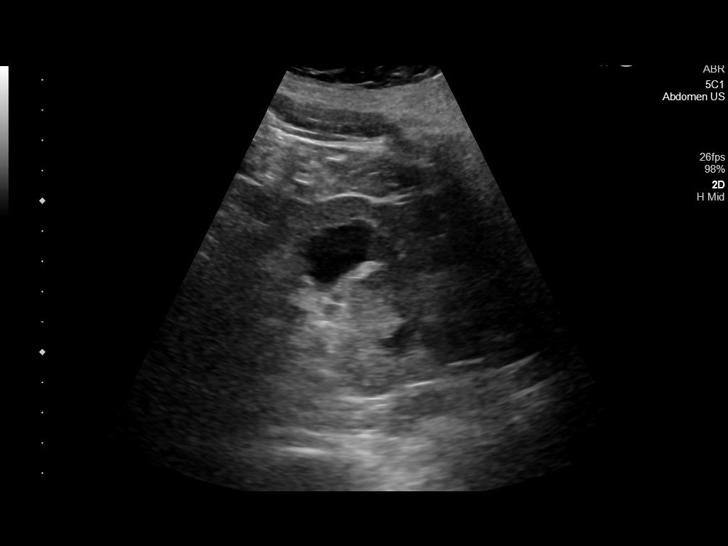
[im 81/89]
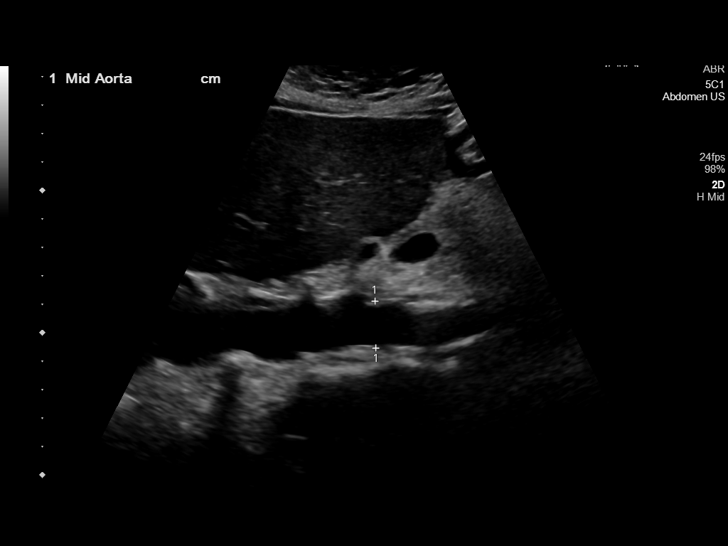
[im 89/89]
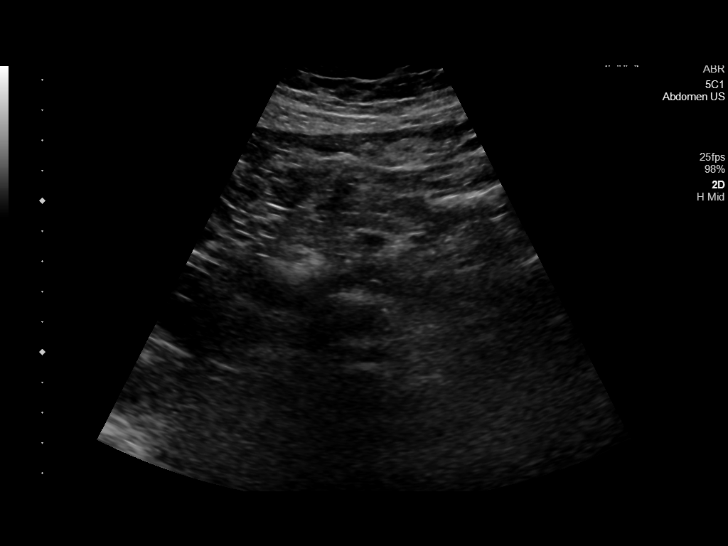

[13 of 25 positions shown; findings below may reference images not displayed]

FINDINGS: Abdomen:

Gallbladder: Physiologically distended. No gallstones or wall
thickening visualized. No sonographic Murphy sign noted by
sonographer.

Common bile duct: Diameter: 3 mm, normal.

Liver: No focal lesion identified. Heterogeneously increased in
parenchymal echogenicity. No perihepatic fluid collection or
sonographic evidence of hepatic injury. Portal vein is patent on
color Doppler imaging with normal direction of blood flow towards
the liver.

IVC: No abnormality visualized.

Pancreas: Visualized portion unremarkable. No peripancreatic
collection visualized by ultrasound.

Spleen: Size and appearance within normal limits.

Right Kidney: Length: 10.3 cm. No evidence of perinephric hematoma
post recent biopsy. Slight prominence of the renal calices without
renal pelvis dilatation. No focal renal abnormality.

Left Kidney: Length: 10.8 cm. Mild calyceal dilatation without renal
pelvis distension or frank hydronephrosis. No focal renal
abnormality.

Abdominal aorta: No aneurysm visualized.  Atherosclerotic plaque.

Other findings: No visualized free fluid or ascites.

Pelvis:

Bladder: Physiologically distended without wall thickening or
intraluminal debris.

Other findings: No pelvic ascites.
IMPRESSION: 1. No sonographic evidence of complication post right renal biopsy
yesterday. No evidence perinephric hematoma. If there is persistent
clinical concern for post biopsy injury, recommend further
evaluation with CT.
2. No evidence of peripancreatic inflammation by ultrasound.
3. No abdominal or pelvic ascites.
4. No debris in the urinary bladder.

## 2020-09-12 IMAGING — CT CT ABD-PELV W/O CM
2 of 4 series · 16 of 46 positions shown, 18 images · non-contrast
Comparison: [DATE]

CLINICAL DATA: History of recent right kidney biopsy. Patient with
flank pain and hematuria.

EXAM:
CT ABDOMEN AND PELVIS WITHOUT CONTRAST
TECHNIQUE: Multidetector CT imaging of the abdomen and pelvis was performed
following the standard protocol without IV contrast.

[Series 2: axial st · axial · 0.82mm/px · z∈[+32,+457]mm · 13 of 93 slices shown, 15 images]
[im 4/93  soft-tissue]
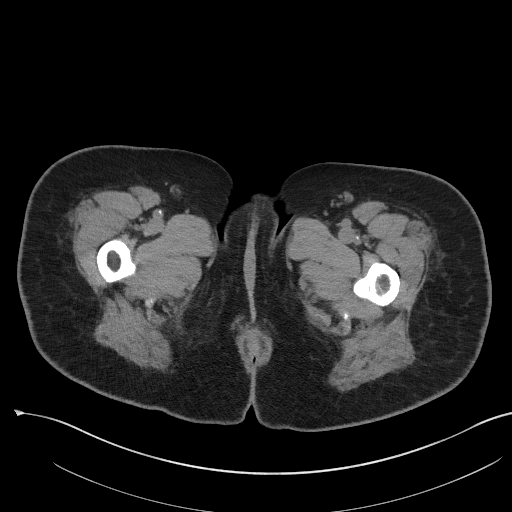
[im 4/93  bone]
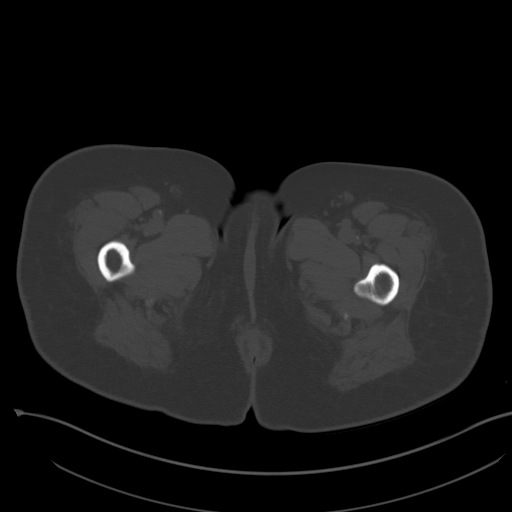
[im 12/93  soft-tissue]
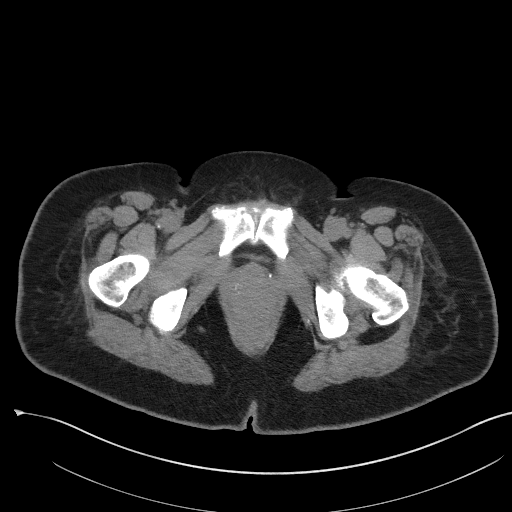
[im 20/93  soft-tissue]
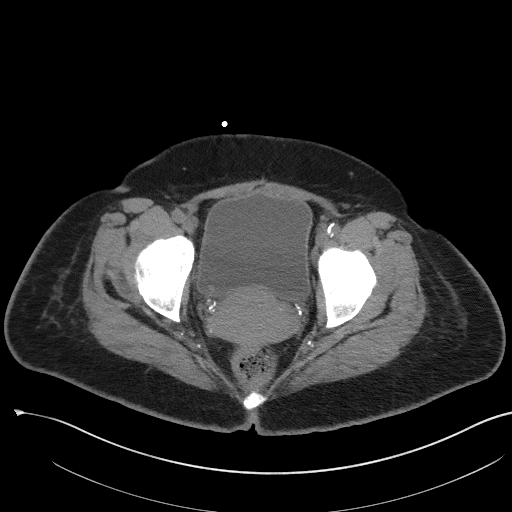
[im 27/93  soft-tissue]
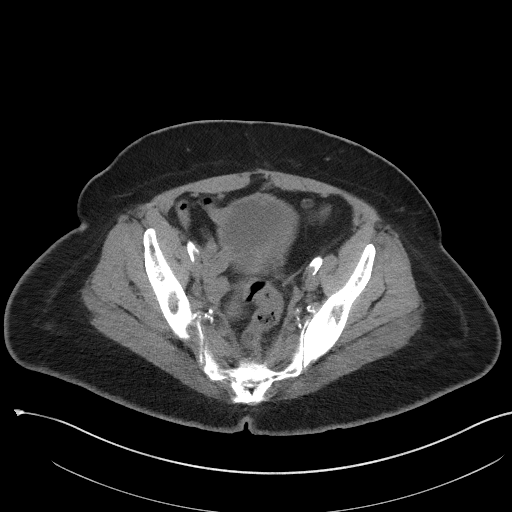
[im 31/93  soft-tissue]
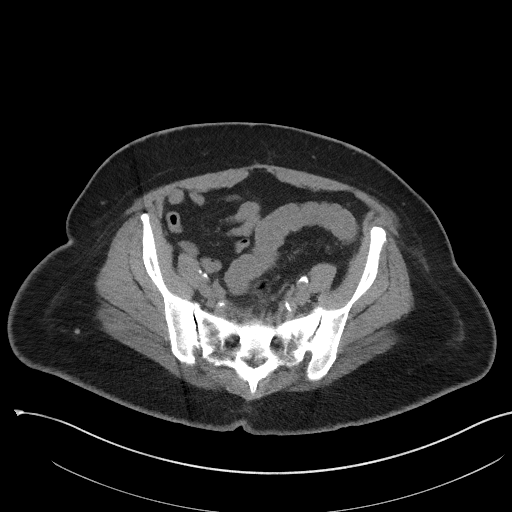
[im 39/93  soft-tissue]
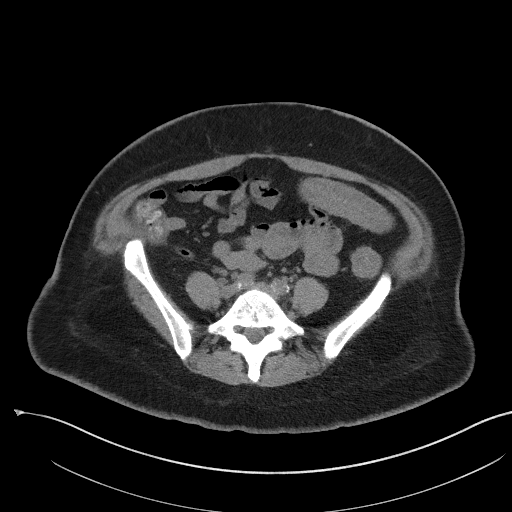
[im 47/93  soft-tissue]
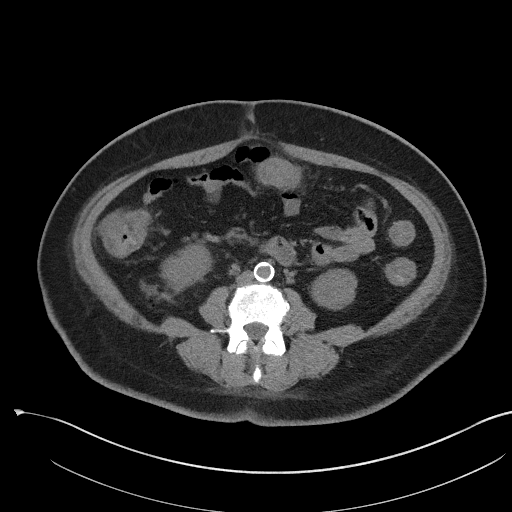
[im 54/93  soft-tissue]
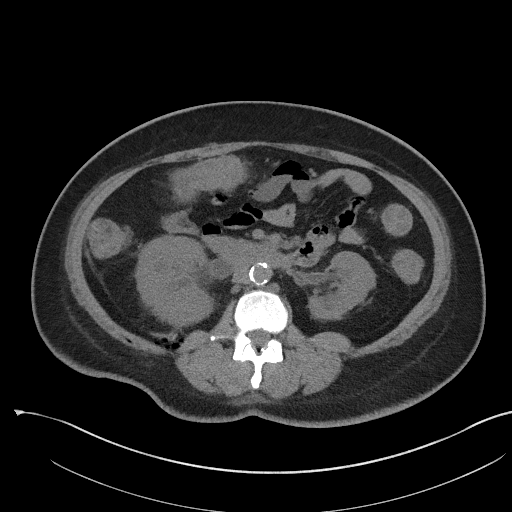
[im 62/93  soft-tissue]
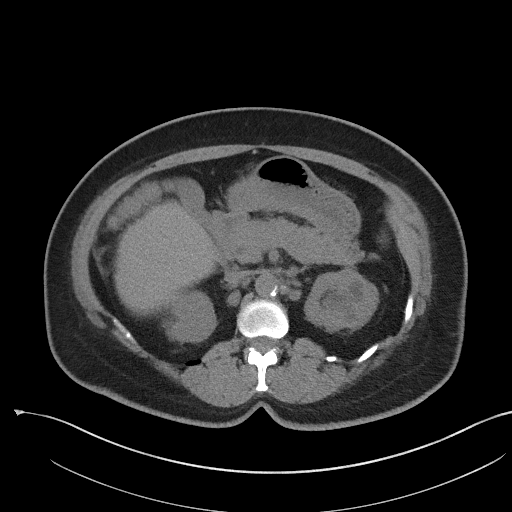
[im 62/93  bone]
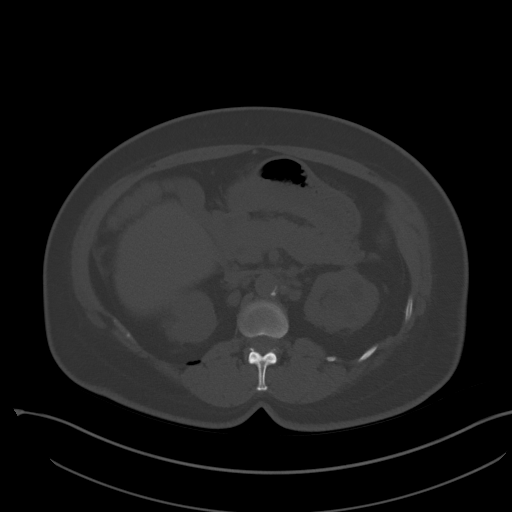
[im 66/93  soft-tissue]
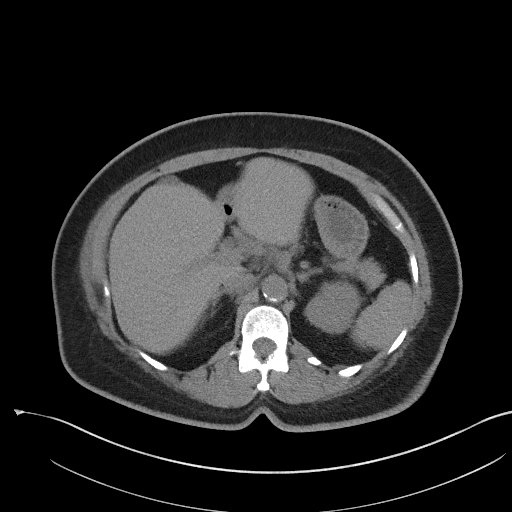
[im 73/93  soft-tissue]
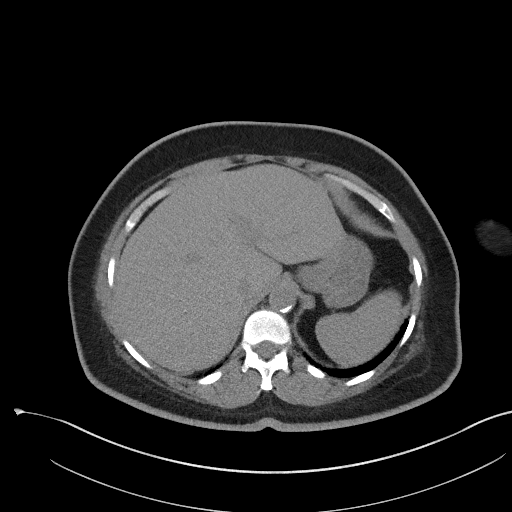
[im 81/93  soft-tissue]
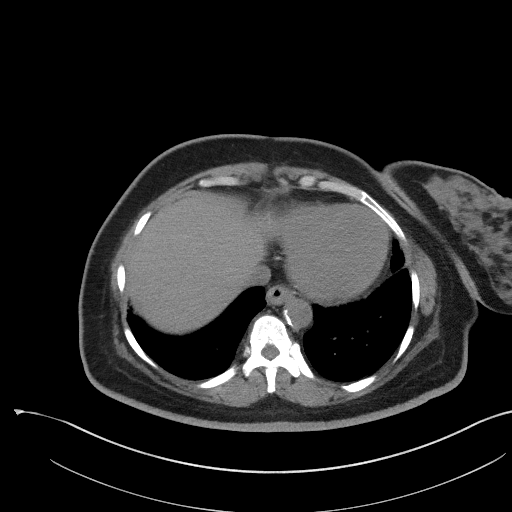
[im 89/93  soft-tissue]
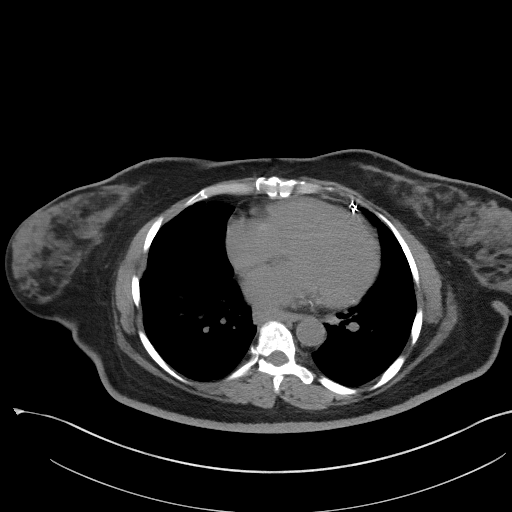

[Series 5: coronal st · coronal · 0.90mm/px · 3 of 87 slices shown]
[im 29/87  soft-tissue]
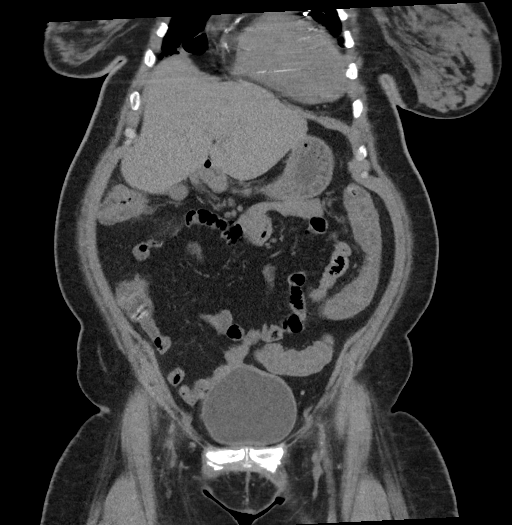
[im 39/87  soft-tissue]
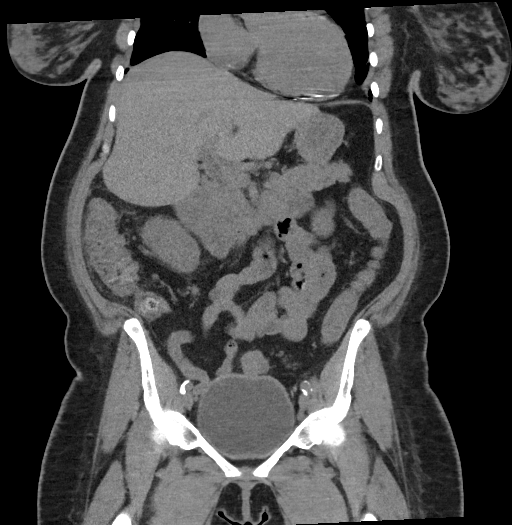
[im 48/87  soft-tissue]
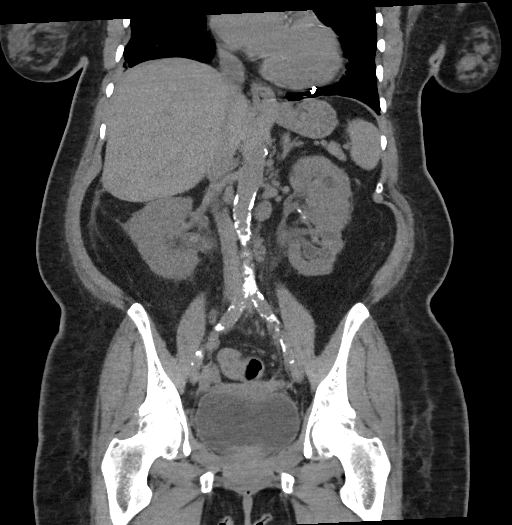

[16 of 46 positions shown; findings below may reference images not displayed]

FINDINGS: Lower chest: The lung bases are clear of an acute process. No
pleural effusion or pulmonary lesions. Stable age advanced aortic
and coronary artery calcifications and evidence of prior coronary
artery bypass surgery. No pericardial effusion. The esophagus is
grossly normal.

Hepatobiliary: No hepatic lesions or intrahepatic biliary
dilatation. The gallbladder appears normal.

Pancreas: No mass, inflammation or ductal dilatation.

Spleen: Normal size.  No focal lesions.

Adrenals/Urinary Tract: The adrenal glands are unremarkable and
stable.

Stable bilateral hydronephrosis and slightly prominent extra renal
pelves. No significant hydroureter. Small area of high attenuation
in the right collecting system could be a small amount of
blood/hematoma. No renal or ureteral calculi. Left renal artery
calcifications are noted.

There is a small right-sided perinephric hematoma and perinephric
air both of which are not unexpected.

The bladder is unremarkable.  No bladder calculi, mass or hematoma.

Stomach/Bowel: The stomach, duodenum, small bowel and colon are
grossly normal without oral contrast. No acute inflammatory changes,
mass lesions or obstructive findings. The terminal ileum and
appendix are normal.

Vascular/Lymphatic: Age advanced atherosclerotic calcifications
involving the aorta and branch vessels. No aneurysm.

Stable borderline enlarged retroperitoneal lymph nodes.

Reproductive: The uterus and ovaries are unremarkable.

Other: No pelvic mass or adenopathy. No free pelvic fluid
collections. No inguinal mass or adenopathy. No abdominal wall
hernia or subcutaneous lesions.

Musculoskeletal: No significant bony findings.
IMPRESSION: 1. Stable bilateral hydronephrosis and slightly prominent extra
renal pelves. No significant hydroureter. Small area of high
attenuation in the right collecting system could be a small amount
of blood/hematoma.
2. Small right-sided perinephric hematoma and perinephric air both
of which are not unexpected. No large retroperitoneal hematoma.
3. No renal or ureteral calculi.
4. Age advanced atherosclerotic calcifications involving the aorta
and branch vessels.
5. Aortic atherosclerosis.

Aortic Atherosclerosis ([LB]-[LB]).

## 2020-09-12 MED ORDER — SODIUM CHLORIDE 0.9 % IV SOLN: INTRAVENOUS | Status: DC

## 2020-09-12 MED ORDER — INSULIN ASPART 100 UNIT/ML ~~LOC~~ SOLN
0.0000 [IU] | Freq: Every day | SUBCUTANEOUS | Status: DC
  Administered 2020-09-12 – 2020-09-13 (×2): 2 [IU] via SUBCUTANEOUS
  Administered 2020-09-14: 3 [IU] via SUBCUTANEOUS
  Filled 2020-09-12 (×3): qty 1

## 2020-09-12 MED ORDER — AMLODIPINE BESYLATE 10 MG PO TABS
10.0000 mg | ORAL_TABLET | Freq: Every day | ORAL | Status: DC
  Administered 2020-09-12 – 2020-09-15 (×4): 10 mg via ORAL
  Filled 2020-09-12: qty 2
  Filled 2020-09-12: qty 1
  Filled 2020-09-12: qty 2
  Filled 2020-09-12: qty 1

## 2020-09-12 MED ORDER — MORPHINE SULFATE (PF) 4 MG/ML IV SOLN
4.0000 mg | Freq: Once | INTRAVENOUS | Status: AC
  Administered 2020-09-12: 4 mg via INTRAVENOUS
  Filled 2020-09-12: qty 1

## 2020-09-12 MED ORDER — OXYCODONE HCL 5 MG PO TABS
5.0000 mg | ORAL_TABLET | ORAL | Status: DC | PRN
  Administered 2020-09-14 – 2020-09-15 (×5): 5 mg via ORAL
  Filled 2020-09-12 (×5): qty 1

## 2020-09-12 MED ORDER — ONDANSETRON HCL 4 MG PO TABS: 4.0000 mg | ORAL_TABLET | Freq: Four times a day (QID) | ORAL | Status: DC | PRN

## 2020-09-12 MED ORDER — ACETAMINOPHEN 325 MG PO TABS
650.0000 mg | ORAL_TABLET | Freq: Four times a day (QID) | ORAL | Status: DC | PRN
  Administered 2020-09-14: 650 mg via ORAL
  Filled 2020-09-12: qty 2

## 2020-09-12 MED ORDER — SODIUM CHLORIDE 0.9 % IV SOLN: Freq: Once | INTRAVENOUS | Status: DC

## 2020-09-12 MED ORDER — VALACYCLOVIR HCL 500 MG PO TABS
500.0000 mg | ORAL_TABLET | Freq: Two times a day (BID) | ORAL | Status: DC
  Administered 2020-09-12 – 2020-09-15 (×6): 500 mg via ORAL
  Filled 2020-09-12 (×7): qty 1

## 2020-09-12 MED ORDER — MORPHINE SULFATE (PF) 2 MG/ML IV SOLN
1.0000 mg | INTRAVENOUS | Status: DC | PRN
  Administered 2020-09-13 – 2020-09-14 (×2): 1 mg via INTRAVENOUS
  Filled 2020-09-12 (×2): qty 1

## 2020-09-12 MED ORDER — BISACODYL 5 MG PO TBEC: 5.0000 mg | DELAYED_RELEASE_TABLET | Freq: Every day | ORAL | Status: DC | PRN

## 2020-09-12 MED ORDER — ONDANSETRON HCL 4 MG/2ML IJ SOLN: 4.0000 mg | Freq: Four times a day (QID) | INTRAMUSCULAR | Status: DC | PRN

## 2020-09-12 MED ORDER — MIRTAZAPINE 15 MG PO TBDP
30.0000 mg | ORAL_TABLET | Freq: Every day | ORAL | Status: DC
  Administered 2020-09-12 – 2020-09-14 (×3): 30 mg via ORAL
  Filled 2020-09-12 (×5): qty 2

## 2020-09-12 MED ORDER — CARVEDILOL 12.5 MG PO TABS
12.5000 mg | ORAL_TABLET | Freq: Two times a day (BID) | ORAL | Status: DC
  Administered 2020-09-12 – 2020-09-15 (×6): 12.5 mg via ORAL
  Filled 2020-09-12 (×2): qty 1
  Filled 2020-09-12: qty 2
  Filled 2020-09-12: qty 1
  Filled 2020-09-12 (×2): qty 2

## 2020-09-12 MED ORDER — ATORVASTATIN CALCIUM 20 MG PO TABS
80.0000 mg | ORAL_TABLET | Freq: Every day | ORAL | Status: DC
  Administered 2020-09-12 – 2020-09-15 (×4): 80 mg via ORAL
  Filled 2020-09-12 (×4): qty 4

## 2020-09-12 MED ORDER — ONDANSETRON HCL 4 MG/2ML IJ SOLN
4.0000 mg | Freq: Once | INTRAMUSCULAR | Status: AC
  Administered 2020-09-12: 4 mg via INTRAVENOUS
  Filled 2020-09-12: qty 2

## 2020-09-12 MED ORDER — ACETAMINOPHEN 650 MG RE SUPP: 650.0000 mg | Freq: Four times a day (QID) | RECTAL | Status: DC | PRN

## 2020-09-12 MED ORDER — TOPIRAMATE 25 MG PO TABS
25.0000 mg | ORAL_TABLET | Freq: Two times a day (BID) | ORAL | Status: DC
  Administered 2020-09-12 – 2020-09-15 (×7): 25 mg via ORAL
  Filled 2020-09-12 (×8): qty 1

## 2020-09-12 MED ORDER — INSULIN ASPART 100 UNIT/ML ~~LOC~~ SOLN
0.0000 [IU] | Freq: Three times a day (TID) | SUBCUTANEOUS | Status: DC
  Administered 2020-09-13: 3 [IU] via SUBCUTANEOUS
  Administered 2020-09-13 (×2): 5 [IU] via SUBCUTANEOUS
  Administered 2020-09-14: 2 [IU] via SUBCUTANEOUS
  Administered 2020-09-14 (×2): 5 [IU] via SUBCUTANEOUS
  Administered 2020-09-15: 2 [IU] via SUBCUTANEOUS
  Administered 2020-09-15: 5 [IU] via SUBCUTANEOUS
  Filled 2020-09-12 (×8): qty 1

## 2020-09-12 MED ORDER — ISOSORBIDE MONONITRATE ER 30 MG PO TB24
30.0000 mg | ORAL_TABLET | Freq: Every day | ORAL | Status: DC
  Administered 2020-09-13 – 2020-09-15 (×4): 30 mg via ORAL
  Filled 2020-09-12 (×4): qty 1

## 2020-09-12 MED ORDER — FLUTICASONE FUROATE-VILANTEROL 200-25 MCG/INH IN AEPB
1.0000 | INHALATION_SPRAY | Freq: Every day | RESPIRATORY_TRACT | Status: DC
  Administered 2020-09-12 – 2020-09-15 (×4): 1 via RESPIRATORY_TRACT
  Filled 2020-09-12: qty 28

## 2020-09-12 MED ORDER — IPRATROPIUM-ALBUTEROL 0.5-2.5 (3) MG/3ML IN SOLN: 3.0000 mL | RESPIRATORY_TRACT | Status: DC | PRN

## 2020-09-12 MED ORDER — ACETAMINOPHEN 500 MG PO TABS
1000.0000 mg | ORAL_TABLET | Freq: Once | ORAL | Status: AC
  Administered 2020-09-12: 1000 mg via ORAL
  Filled 2020-09-12: qty 2

## 2020-09-12 NOTE — ED Triage Notes (Addendum)
Pt presents to ED via POV with c/o dark hematuria, pt states had a kidney biopsy done yesterday on the R kidney, states today noted dark red blood in urine. Pt also c/o R flank pain at the incision site and lower abdominal pain.   Pt states had hematuria yesterday prior to discharge, drank water and it cleared up, then re-started again this morning.

## 2020-09-12 NOTE — ED Notes (Signed)
Pt resting calmly in bed. Bed locked low. Rail up. Pt's eyes remain bulged out like with thyroid issues/graves disease. Pt denies history of thyroid condition but reports this happened with past tumor to pituitary gland.

## 2020-09-12 NOTE — ED Notes (Signed)
Visitor to bedside.  

## 2020-09-12 NOTE — ED Notes (Signed)
EDP Malinda notified that pt's BP 203/100 currently and IV team consult placed as this RN and Mimi RN attempted for IV access without success.

## 2020-09-12 NOTE — H&P (Signed)
History and Physical    Terry Arroyo YBO:[id number] DOB: 1961-08-23 DOA: 09/12/2020  PCP: Kirk Ruths, MD  Patient coming from: home    Chief Complaint:  Hematuria  HPI: 59 y/o F w/ PMH of CAD s/p CABG in 2018, CVA (in 2014), CKD, asthma, HTN, HLD, DM2, pituitary tumor s/p removal (in 1992, benign), blindness of left eye who presents w/ hematuria and right flank pain x day prior to admission. The flank pain is on the right side that is sharp, intermittent w/ radiation to the lower abd. Laying on her stomach makes the pain better and laying on her right side makes the pain worse. The severity is currently 6/10. Of note, pt had a kidney biopsy the day prior to admission for worsening Cr/GFR of unknown etiology. Also, pt c/o chills, nausea and vomiting. Pt denies any fever, sweating, cough, chest pain, dysuria, urinary urgency, diarrhea or constipation.   Review of Systems: As per HPI otherwise 10 point review of systems negative.    Past Medical History:  Diagnosis Date  . Blind left eye   . Brain tumor ([place]) 1986  . CHF (congestive heart failure) ([place])   . Diabetes mellitus without complication ([place])   . Hyperlipidemia   . Hypertension   . MI (myocardial infarction) ([place]) 2018    Past Surgical History:  Procedure Laterality Date  . BRAIN SURGERY    . BREAST EXCISIONAL BIOPSY Left yrs ago   benign  . CESAREAN SECTION       reports that she has quit smoking. She has never used smokeless tobacco. She reports previous alcohol use. She reports previous drug use.  Allergies  Allergen Reactions  . Hydralazine Shortness Of Breath and Swelling    Body aches  . Shellfish Allergy Anaphylaxis    Shrimp/lobster  Shrimp/lobster    . Kiwi Extract Swelling  . Metformin Other (See Comments)    Other reaction(s): GI Intolerance    Family History  Problem Relation Age of Onset  . CAD Mother   . CAD Brother   . Breast cancer Sister 11     Prior to Admission medications    Medication Sig Start Date End Date Taking? Authorizing Provider  amLODipine (NORVASC) 10 MG tablet Take 1 tablet (10 mg total) by mouth daily. Patient not taking: Reported on 09/11/2020 03/03/20   Lorella Nimrod, MD  aspirin EC 81 MG EC tablet Take 1 tablet (81 mg total) by mouth daily. 11/26/19   Lavina Hamman, MD  atorvastatin (LIPITOR) 80 MG tablet Take 1 tablet (80 mg total) by mouth daily at 6 PM. Patient not taking: Reported on 09/11/2020 11/26/19   Lavina Hamman, MD  carvedilol (COREG) 12.5 MG tablet Take 12.5 mg by mouth 2 (two) times daily with a meal.    [provider]  Fluticasone-Salmeterol (ADVAIR) 250-50 MCG/DOSE AEPB Inhale 1 puff into the lungs 2 (two) times daily.    [provider]  furosemide (LASIX) 40 MG tablet Take 1 tablet (40 mg total) by mouth daily. 03/02/20   Lorella Nimrod, MD  insulin aspart protamine- aspart (NOVOLOG MIX 70/30) (70-30) 100 UNIT/ML injection Inject 0.4 mLs (40 Units total) into the skin 2 (two) times daily with a meal. Patient taking differently: Inject 45 Units into the skin 2 (two) times daily with a meal.  11/26/19   Lavina Hamman, MD  isosorbide mononitrate (IMDUR) 30 MG 24 hr tablet Take 30 mg by mouth daily.    [provider]  lisinopril (ZESTRIL) 30 MG tablet Take 30 mg by mouth daily.     [provider]  methocarbamol (ROBAXIN) 500 MG tablet Take 1 tablet (500 mg total) by mouth every 8 (eight) hours as needed for muscle spasms. 11/26/19   Lavina Hamman, MD  mirtazapine (REMERON) 30 MG tablet Take 1 tablet (30 mg total) by mouth at bedtime. 11/26/19   Lavina Hamman, MD  mometasone-formoterol [facility]) 200-5 MCG/ACT AERO Inhale 2 puffs into the lungs 2 (two) times daily.    [provider]  topiramate (TOPAMAX) 25 MG capsule Take 25 mg by mouth daily.    [provider]  valACYclovir (VALTREX) 500 MG tablet Take 1 tablet by mouth 2 (two) times daily. 05/20/17   [provider]     Physical Exam: Vitals:   09/12/20 0812 09/12/20 1042 09/12/20 1250 09/12/20 1558  BP: (!) 179/86 (!) 184/87 (!) 192/90 (!) 203/100  Pulse: 71 66 72 77  Resp: 18 20 18 18   Temp: 98.3 F (36.8 C)     TempSrc: Oral     SpO2: 99% 100% 99% 100%  Weight:      Height:        Constitutional: NAD, calm but uncomfortable  Vitals:   09/12/20 0812 09/12/20 1042 09/12/20 1250 09/12/20 1558  BP: (!) 179/86 (!) 184/87 (!) 192/90 (!) 203/100  Pulse: 71 66 72 77  Resp: 18 20 18 18   Temp: 98.3 F (36.8 C)     TempSrc: Oral     SpO2: 99% 100% 99% 100%  Weight:      Height:       Eyes: PERRL, lids and conjunctivae normal ENMT: Mucous membranes are moist.  Neck: normal, supple Respiratory: clear to auscultation bilaterally, no wheezing, no crackles. Normal respiratory effort Cardiovascular: S1/S2+, no rubs / gallops. 2+ b/l LE pitting edema Abdomen: soft, tenderness to palpation, non-distended & hyperactive bowel sounds Musculoskeletal: no clubbing / cyanosis. No joint deformity upper and lower extremities. Good ROM, no contractures. Normal muscle tone.  Skin: no rashes, lesions, ulcers. Neurologic: CN 2-12 grossly intact.  Strength 5/5 in all 4.  Psychiatric: Normal judgment and insight. Alert and oriented x 3. Flat mood and affect   Labs on Admission: I have personally reviewed following labs and imaging studies  CBC: Recent Labs  Lab 09/11/20 0933 09/12/20 0821  WBC 9.9 11.2*  NEUTROABS  --  8.5*  HGB 13.1 11.3*  HCT 36.6 32.3*  MCV 83.4 85.0  PLT 387 182   Basic Metabolic Panel: Recent Labs  Lab 09/12/20 0821  NA 135  K 3.3*  CL 100  CO2 24  GLUCOSE 290*  BUN 24*  CREATININE 3.52*  CALCIUM 8.3*   GFR: Estimated Creatinine Clearance: 18.8 mL/min (A) (by C-G formula based on SCr of 3.52 mg/dL (H)). Liver Function Tests: Recent Labs  Lab 09/12/20 0821  AST 13*  ALT 10  ALKPHOS 74  BILITOT 0.6  PROT 6.4*  ALBUMIN 2.3*   Recent Labs  Lab 09/12/20 0821   LIPASE 30   No results for input(s): AMMONIA in the last 168 hours. Coagulation Profile: Recent Labs  Lab 09/11/20 0933  INR 1.0   Cardiac Enzymes: No results for input(s): CKTOTAL, CKMB, CKMBINDEX, TROPONINI in the last 168 hours. BNP (last 3 results) No results for input(s): PROBNP in the last 8760 hours. HbA1C: No results for input(s): HGBA1C in the last 72 hours. CBG: Recent Labs  Lab 09/11/20 0932  GLUCAP 301*  Lipid Profile: No results for input(s): CHOL, HDL, LDLCALC, TRIG, CHOLHDL, LDLDIRECT in the last 72 hours. Thyroid Function Tests: No results for input(s): TSH, T4TOTAL, FREET4, T3FREE, THYROIDAB in the last 72 hours. Anemia Panel: No results for input(s): VITAMINB12, FOLATE, FERRITIN, TIBC, IRON, RETICCTPCT in the last 72 hours. Urine analysis:    Component Value Date/Time   COLORURINE RED (A) 09/12/2020 0945   APPEARANCEUR CLOUDY (A) 09/12/2020 0945   LABSPEC 1.011 09/12/2020 0945   PHURINE  09/12/2020 0945    TEST NOT REPORTED DUE TO COLOR INTERFERENCE OF URINE PIGMENT   GLUCOSEU (A) 09/12/2020 0945    TEST NOT REPORTED DUE TO COLOR INTERFERENCE OF URINE PIGMENT   HGBUR (A) 09/12/2020 0945    TEST NOT REPORTED DUE TO COLOR INTERFERENCE OF URINE PIGMENT   BILIRUBINUR (A) 09/12/2020 0945    TEST NOT REPORTED DUE TO COLOR INTERFERENCE OF URINE PIGMENT   KETONESUR (A) 09/12/2020 0945    TEST NOT REPORTED DUE TO COLOR INTERFERENCE OF URINE PIGMENT   PROTEINUR (A) 09/12/2020 0945    TEST NOT REPORTED DUE TO COLOR INTERFERENCE OF URINE PIGMENT   NITRITE (A) 09/12/2020 0945    TEST NOT REPORTED DUE TO COLOR INTERFERENCE OF URINE PIGMENT   LEUKOCYTESUR (A) 09/12/2020 0945    TEST NOT REPORTED DUE TO COLOR INTERFERENCE OF URINE PIGMENT    Radiological Exams on Admission: US Abdomen Complete  Result Date: 09/12/2020 CLINICAL DATA:  Ultrasound guided right renal biopsy yesterday. Diffuse abdominal and right flank pain. EXAM: ABDOMEN ULTRASOUND COMPLETE  ABDOMEN PELVIS LIMITED COMPARISON:  Noncontrast abdominal CT 03/01/2020 FINDINGS: Abdomen: Gallbladder: Physiologically distended. No gallstones or wall thickening visualized. No sonographic Murphy sign noted by sonographer. Common bile duct: Diameter: 3 mm, normal. Liver: No focal lesion identified. Heterogeneously increased in parenchymal echogenicity. No perihepatic fluid collection or sonographic evidence of hepatic injury. Portal vein is patent on color Doppler imaging with normal direction of blood flow towards the liver. IVC: No abnormality visualized. Pancreas: Visualized portion unremarkable. No peripancreatic collection visualized by ultrasound. Spleen: Size and appearance within normal limits. Right Kidney: Length: 10.3 cm. No evidence of perinephric hematoma post recent biopsy. Slight prominence of the renal calices without renal pelvis dilatation. No focal renal abnormality. Left Kidney: Length: 10.8 cm. Mild calyceal dilatation without renal pelvis distension or frank hydronephrosis. No focal renal abnormality. Abdominal aorta: No aneurysm visualized.  Atherosclerotic plaque. Other findings: No visualized free fluid or ascites. Pelvis: Bladder: Physiologically distended without wall thickening or intraluminal debris. Other findings: No pelvic ascites. IMPRESSION: 1. No sonographic evidence of complication post right renal biopsy yesterday. No evidence perinephric hematoma. If there is persistent clinical concern for post biopsy injury, recommend further evaluation with CT. 2. No evidence of peripancreatic inflammation by ultrasound. 3. No abdominal or pelvic ascites. 4. No debris in the urinary bladder. Electronically Signed   By: Keith Rake M.D.   On: 09/12/2020 15:59   US PELVIS LIMITED (TRANSABDOMINAL ONLY)  Result Date: 09/12/2020 CLINICAL DATA:  Ultrasound guided right renal biopsy yesterday. Diffuse abdominal and right flank pain. EXAM: ABDOMEN ULTRASOUND COMPLETE ABDOMEN PELVIS LIMITED  COMPARISON:  Noncontrast abdominal CT 03/01/2020 FINDINGS: Abdomen: Gallbladder: Physiologically distended. No gallstones or wall thickening visualized. No sonographic Murphy sign noted by sonographer. Common bile duct: Diameter: 3 mm, normal. Liver: No focal lesion identified. Heterogeneously increased in parenchymal echogenicity. No perihepatic fluid collection or sonographic evidence of hepatic injury. Portal vein is patent on color Doppler imaging with normal direction of blood flow towards  the liver. IVC: No abnormality visualized. Pancreas: Visualized portion unremarkable. No peripancreatic collection visualized by ultrasound. Spleen: Size and appearance within normal limits. Right Kidney: Length: 10.3 cm. No evidence of perinephric hematoma post recent biopsy. Slight prominence of the renal calices without renal pelvis dilatation. No focal renal abnormality. Left Kidney: Length: 10.8 cm. Mild calyceal dilatation without renal pelvis distension or frank hydronephrosis. No focal renal abnormality. Abdominal aorta: No aneurysm visualized.  Atherosclerotic plaque. Other findings: No visualized free fluid or ascites. Pelvis: Bladder: Physiologically distended without wall thickening or intraluminal debris. Other findings: No pelvic ascites. IMPRESSION: 1. No sonographic evidence of complication post right renal biopsy yesterday. No evidence perinephric hematoma. If there is persistent clinical concern for post biopsy injury, recommend further evaluation with CT. 2. No evidence of peripancreatic inflammation by ultrasound. 3. No abdominal or pelvic ascites. 4. No debris in the urinary bladder. Electronically Signed   By: Keith Rake M.D.   On: 09/12/2020 15:59   US BIOPSY (KIDNEY)  Result Date: 09/11/2020 INDICATION: Chronic kidney disease, proteinuria, hypertension and diabetes. The patient requires renal biopsy. EXAM: ULTRASOUND GUIDED CORE BIOPSY OF RIGHT KIDNEY MEDICATIONS: None. ANESTHESIA/SEDATION:  Fentanyl 100 mcg IV; Versed 1.0 mg IV Moderate Sedation Time:  10 minutes. The patient was continuously monitored during the procedure by the interventional radiology nurse under my direct supervision. PROCEDURE: The procedure, risks, benefits, and alternatives were explained to the patient. Questions regarding the procedure were encouraged and answered. The patient understands and consents to the procedure. A time-out was performed prior to initiating the procedure. Ultrasound was performed of both kidneys in a prone position. The right flank region was prepped with chlorhexidine in a sterile fashion, and a sterile drape was applied covering the operative field. A sterile gown and sterile gloves were used for the procedure. Local anesthesia was provided with 1% Lidocaine. A 15 gauge trocar needle was advanced to the margin of lower pole cortex of the right kidney. After confirming needle tip position, 2 separate coaxial 16 gauge core biopsy samples were obtained. Material was submitted on saline soaked Telfa gauze. A slurry of Gel-Foam pledgets was injected via the outer needle as the needle was retracted. Additional ultrasound was performed. COMPLICATIONS: None immediate. FINDINGS: Both kidneys were well-visualized by ultrasound. The left kidney demonstrates more significant cortical atrophy at the level of the lower pole compared to the right and the right was therefore chosen for sampling. Solid core biopsy samples were obtained. IMPRESSION: Ultrasound-guided core biopsy performed at the level of right lower pole renal cortex. Electronically Signed   By: Aletta Edouard M.D.   On: 09/11/2020 14:19    EKG: Independently reviewed.   Assessment/Plan Active Problems:   * No active hospital problems. * Hematuria: gross. UA is shows few bacteria, red color. Urine cx ordered. Likely secondary to kidney biopsy on 09/11/20. [place] of kidneys shows no perinephric hematoma or no [place] evidence of complication of post renal  biopsy. CT abd/pelvis ordered to further evaluate. Nephro consulted   AKI on CKDIV: etiology unclear. Kidney biopsy results pending. Continue on IVFs. Nephro consulted. Will hold home dose of ACE-I, lasix secondary to AKI  Leukocytosis: likely reactive. Will continue to monitor   DM2: poorly controlled w/ HbA1c 10.2 in March 2021. Will start SSI w/ accuchecks  HTN: uncontrolled. W/ HTN urgency. Will continue on home dose of carvedilol, amlodipine, imdur. IV hydralazine prn   CAD: s/p CABG in 2018. Will hold home dose of aspirin, ACE-I secondary to hematuria &  AKI on CKD. Will continue on home dose of amlodipine, carvedilol, imdur  HLD: will continue on statin  Hx of pituitary tumor: s/p removal in 1992 which left the pt blind in the left eye  Insomnia: continue on home dose of mirtazapine    DVT prophylaxis: SCDs Code Status: full  Family Communication:  Disposition Plan: likely d/c back home Consults called: nephro, Dr. Juleen China Admission status: observation   Wyvonnia Dusky MD [facility] Pager [phone]   If 7PM-7AM, please contact night-coverage [email or web address]   09/12/2020, 4:38 PM

## 2020-09-12 NOTE — ED Provider Notes (Addendum)
[facility] Emergency Department Provider Note   ____________________________________________   First MD Initiated Contact with Patient 09/12/20 1407     (approximate)  I have reviewed the triage vital signs and the nursing notes.   HISTORY  Chief Complaint Post-op Problem and Hematuria    HPI Terry Arroyo is a 59 y.o. female patient with renal biopsy done yesterday.  Today she is having a lot of pain in the right flank and and diffusely in the abdomen along with very bloody urine.  She reports urine looks almost like blood.  It stopped briefly after she drank some water yesterday and started up again.  She has not had blood in urine before the biopsy as I understand it.  Pain is moderate to moderately severe.  Is made worse with palpation or movement.  Started after the biopsy.  This both in the right flank and in the belly.  Deep and achy.         Past Medical History:  Diagnosis Date  . Blind left eye   . Brain tumor ([place]) 1986  . CHF (congestive heart failure) ([place])   . Diabetes mellitus without complication ([place])   . Hyperlipidemia   . Hypertension   . MI (myocardial infarction) ([place]) 2018    Patient Active Problem List   Diagnosis Date Noted  . Hematuria 09/12/2020  . Calf tenderness   . Acute on chronic heart failure ([place]) 02/27/2020  . Leg edema   . DM2 (diabetes mellitus, type 2) ([place]) 02/26/2020  . CKD (chronic kidney disease), stage III 02/26/2020  . Hyponatremia 02/26/2020  . Anasarca 02/26/2020  . Proteinuria 02/26/2020  . Hypoalbuminemia 02/26/2020  . Elevated troponin 02/26/2020  . Acute on chronic systolic CHF (congestive heart failure) ([place]) 02/26/2020  . Hyperglycemia   . Nausea   . Hypertensive emergency   . Hypertensive urgency 11/24/2019  . Nonspecific chest pain 11/23/2019    Past Surgical History:  Procedure Laterality Date  . BRAIN SURGERY    . BREAST EXCISIONAL BIOPSY Left yrs ago   benign  . CESAREAN  SECTION      Prior to Admission medications   Medication Sig Start Date End Date Taking? Authorizing Provider  amLODipine (NORVASC) 10 MG tablet Take 1 tablet (10 mg total) by mouth daily. Patient not taking: Reported on 09/11/2020 03/03/20   Lorella Nimrod, MD  aspirin EC 81 MG EC tablet Take 1 tablet (81 mg total) by mouth daily. 11/26/19   Lavina Hamman, MD  atorvastatin (LIPITOR) 80 MG tablet Take 1 tablet (80 mg total) by mouth daily at 6 PM. Patient not taking: Reported on 09/11/2020 11/26/19   Lavina Hamman, MD  carvedilol (COREG) 12.5 MG tablet Take 12.5 mg by mouth 2 (two) times daily with a meal.    [provider]  Fluticasone-Salmeterol (ADVAIR) 250-50 MCG/DOSE AEPB Inhale 1 puff into the lungs 2 (two) times daily.    [provider]  furosemide (LASIX) 40 MG tablet Take 1 tablet (40 mg total) by mouth daily. 03/02/20   Lorella Nimrod, MD  insulin aspart protamine- aspart (NOVOLOG MIX 70/30) (70-30) 100 UNIT/ML injection Inject 0.4 mLs (40 Units total) into the skin 2 (two) times daily with a meal. Patient taking differently: Inject 45 Units into the skin 2 (two) times daily with a meal.  11/26/19   Lavina Hamman, MD  isosorbide mononitrate (IMDUR) 30 MG 24 hr tablet Take 30 mg by mouth daily.  [provider]  lisinopril (ZESTRIL) 30 MG tablet Take 30 mg by mouth daily.     [provider]  methocarbamol (ROBAXIN) 500 MG tablet Take 1 tablet (500 mg total) by mouth every 8 (eight) hours as needed for muscle spasms. 11/26/19   Lavina Hamman, MD  mirtazapine (REMERON) 30 MG tablet Take 1 tablet (30 mg total) by mouth at bedtime. 11/26/19   Lavina Hamman, MD  mometasone-formoterol [facility]) 200-5 MCG/ACT AERO Inhale 2 puffs into the lungs 2 (two) times daily.    [provider]  topiramate (TOPAMAX) 25 MG capsule Take 25 mg by mouth daily.    [provider]  valACYclovir (VALTREX) 500 MG tablet Take 1 tablet by mouth 2 (two) times  daily. 05/20/17   [provider]    Allergies Hydralazine, Shellfish allergy, Kiwi extract, and Metformin  Family History  Problem Relation Age of Onset  . CAD Mother   . CAD Brother   . Breast cancer Sister 66    Social History Social History   Tobacco Use  . Smoking status: Former Research scientist (life sciences)  . Smokeless tobacco: Never Used  Substance Use Topics  . Alcohol use: Not Currently  . Drug use: Not Currently    Review of Systems  Constitutional: No fever/chills Eyes: No visual changes. ENT: No sore throat. Cardiovascular: Denies chest pain. Respiratory: Denies shortness of breath. Gastrointestinal:  abdominal pain.  No nausea, no vomiting.  No diarrhea.  No constipation. Genitourinary: Negative for dysuria. Musculoskeletal: Negative for back pain. Skin: Negative for rash. Neurological: Negative for headaches, focal weakness  ____________________________________________   PHYSICAL EXAM:  VITAL SIGNS: ED Triage Vitals  Enc Vitals Group     BP 09/12/20 0812 (!) 179/86     Pulse Rate 09/12/20 0812 71     Resp 09/12/20 0812 18     Temp 09/12/20 0812 98.3 F (36.8 C)     Temp Source 09/12/20 0812 Oral     SpO2 09/12/20 0812 99 %     Weight 09/12/20 0811 181 lb (82.1 kg)     Height 09/12/20 0811 5\' 6"  (1.676 m)     Head Circumference --      Peak Flow --      Pain Score 09/12/20 0830 7     Pain Loc --      Pain Edu? --      Excl. in [place]? --     Constitutional: Alert and oriented.  In pain Eyes: Conjunctivae are normal.  Head: Atraumatic. Nose: No congestion/rhinnorhea. Mouth/Throat: Mucous membranes are moist.  Oropharynx non-erythematous. Neck: No stridor.  Cardiovascular: Normal rate, regular rhythm. Grossly normal heart sounds.  Good peripheral circulation. Respiratory: Normal respiratory effort.  No retractions. Lungs CTAB. Gastrointestinal: Soft diffusely tender no distention. No abdominal bruits.  Right CVA tenderness. Musculoskeletal: No lower  extremity tenderness nor edema.  Neurologic:  Normal speech and language. No gross focal neurologic deficits are appreciated. No gait instability. Skin:  Skin is warm, dry and intact. No rash noted.   ____________________________________________   LABS (all labs ordered are listed, but only abnormal results are displayed)  Labs Reviewed  CBC WITH DIFFERENTIAL/PLATELET - Abnormal; Notable for the following components:      Result Value   WBC 11.2 (*)    RBC 3.80 (*)    Hemoglobin 11.3 (*)    HCT 32.3 (*)    Neutro Abs 8.5 (*)    All other components within normal limits  COMPREHENSIVE METABOLIC PANEL -  Abnormal; Notable for the following components:   Potassium 3.3 (*)    Glucose, Bld 290 (*)    BUN 24 (*)    Creatinine, Ser 3.52 (*)    Calcium 8.3 (*)    Total Protein 6.4 (*)    Albumin 2.3 (*)    AST 13 (*)    GFR calc non Af Amer 14 (*)    GFR calc Af Amer 16 (*)    All other components within normal limits  URINALYSIS, COMPLETE (UACMP) WITH MICROSCOPIC - Abnormal; Notable for the following components:   Color, Urine RED (*)    APPearance CLOUDY (*)    Glucose, UA   (*)    Value: TEST NOT REPORTED DUE TO COLOR INTERFERENCE OF URINE PIGMENT   Hgb urine dipstick   (*)    Value: TEST NOT REPORTED DUE TO COLOR INTERFERENCE OF URINE PIGMENT   Bilirubin Urine   (*)    Value: TEST NOT REPORTED DUE TO COLOR INTERFERENCE OF URINE PIGMENT   Ketones, ur   (*)    Value: TEST NOT REPORTED DUE TO COLOR INTERFERENCE OF URINE PIGMENT   Protein, ur   (*)    Value: TEST NOT REPORTED DUE TO COLOR INTERFERENCE OF URINE PIGMENT   Nitrite   (*)    Value: TEST NOT REPORTED DUE TO COLOR INTERFERENCE OF URINE PIGMENT   Leukocytes,Ua   (*)    Value: TEST NOT REPORTED DUE TO COLOR INTERFERENCE OF URINE PIGMENT   RBC / HPF >50 (*)    Bacteria, UA FEW (*)    All other components within normal limits  RESPIRATORY PANEL BY RT PCR (FLU A&B, COVID)  URINE CULTURE  LIPASE, BLOOD  CBC  BASIC  METABOLIC PANEL  TYPE AND SCREEN   ____________________________________________  EKG   ____________________________________________  RADIOLOGY  ED MD interpretation: Ultrasound read by radiology reviewed by me does not show any acute pathology.  Discussed the request for CT with radiology.  They feel noncontrasted CT will be adequate.  We cannot do a contrasted CT because of her poor renal function  Official radiology report(s): US Abdomen Complete  Result Date: 09/12/2020 CLINICAL DATA:  Ultrasound guided right renal biopsy yesterday. Diffuse abdominal and right flank pain. EXAM: ABDOMEN ULTRASOUND COMPLETE ABDOMEN PELVIS LIMITED COMPARISON:  Noncontrast abdominal CT 03/01/2020 FINDINGS: Abdomen: Gallbladder: Physiologically distended. No gallstones or wall thickening visualized. No sonographic Murphy sign noted by sonographer. Common bile duct: Diameter: 3 mm, normal. Liver: No focal lesion identified. Heterogeneously increased in parenchymal echogenicity. No perihepatic fluid collection or sonographic evidence of hepatic injury. Portal vein is patent on color Doppler imaging with normal direction of blood flow towards the liver. IVC: No abnormality visualized. Pancreas: Visualized portion unremarkable. No peripancreatic collection visualized by ultrasound. Spleen: Size and appearance within normal limits. Right Kidney: Length: 10.3 cm. No evidence of perinephric hematoma post recent biopsy. Slight prominence of the renal calices without renal pelvis dilatation. No focal renal abnormality. Left Kidney: Length: 10.8 cm. Mild calyceal dilatation without renal pelvis distension or frank hydronephrosis. No focal renal abnormality. Abdominal aorta: No aneurysm visualized.  Atherosclerotic plaque. Other findings: No visualized free fluid or ascites. Pelvis: Bladder: Physiologically distended without wall thickening or intraluminal debris. Other findings: No pelvic ascites. IMPRESSION: 1. No sonographic  evidence of complication post right renal biopsy yesterday. No evidence perinephric hematoma. If there is persistent clinical concern for post biopsy injury, recommend further evaluation with CT. 2. No evidence of peripancreatic inflammation by ultrasound.  3. No abdominal or pelvic ascites. 4. No debris in the urinary bladder. Electronically Signed   By: Keith Rake M.D.   On: 09/12/2020 15:59   US PELVIS LIMITED (TRANSABDOMINAL ONLY)  Result Date: 09/12/2020 CLINICAL DATA:  Ultrasound guided right renal biopsy yesterday. Diffuse abdominal and right flank pain. EXAM: ABDOMEN ULTRASOUND COMPLETE ABDOMEN PELVIS LIMITED COMPARISON:  Noncontrast abdominal CT 03/01/2020 FINDINGS: Abdomen: Gallbladder: Physiologically distended. No gallstones or wall thickening visualized. No sonographic Murphy sign noted by sonographer. Common bile duct: Diameter: 3 mm, normal. Liver: No focal lesion identified. Heterogeneously increased in parenchymal echogenicity. No perihepatic fluid collection or sonographic evidence of hepatic injury. Portal vein is patent on color Doppler imaging with normal direction of blood flow towards the liver. IVC: No abnormality visualized. Pancreas: Visualized portion unremarkable. No peripancreatic collection visualized by ultrasound. Spleen: Size and appearance within normal limits. Right Kidney: Length: 10.3 cm. No evidence of perinephric hematoma post recent biopsy. Slight prominence of the renal calices without renal pelvis dilatation. No focal renal abnormality. Left Kidney: Length: 10.8 cm. Mild calyceal dilatation without renal pelvis distension or frank hydronephrosis. No focal renal abnormality. Abdominal aorta: No aneurysm visualized.  Atherosclerotic plaque. Other findings: No visualized free fluid or ascites. Pelvis: Bladder: Physiologically distended without wall thickening or intraluminal debris. Other findings: No pelvic ascites. IMPRESSION: 1. No sonographic evidence of  complication post right renal biopsy yesterday. No evidence perinephric hematoma. If there is persistent clinical concern for post biopsy injury, recommend further evaluation with CT. 2. No evidence of peripancreatic inflammation by ultrasound. 3. No abdominal or pelvic ascites. 4. No debris in the urinary bladder. Electronically Signed   By: Keith Rake M.D.   On: 09/12/2020 15:59    ____________________________________________   PROCEDURES  Procedure(s) performed (including Critical Care):  Procedures   ____________________________________________   INITIAL IMPRESSION / ASSESSMENT AND PLAN / ED COURSE  Patient with decreasing GFR.  Even compared to just in May her GFR is lower.  Her H&H is lower compared to yesterday.  Yesterday it was 13 and 36 today at 1132.  Because of her marked pain and bleeding I will get first an ultrasound of her kidneys and abdomen since her GFR is so low that I cannot use contrast and if need be I will add CT.  We will get her typed and screened.    ----------------------------------------- 5:22 PM on 09/12/2020 -----------------------------------------  Patient's blood pressure is up.  We will make sure she is taking all of her medicines and if necessary give her some IV morphine for pain control if that does not work we can try further measures to reduce her blood pressure.   Even if the CT is negative I want to watch the patient.  Her blood pressure is going up in spite of having her medicines and she is having the gross hematuria after the biopsy with a fairly significant decrease in her H&H.  This will have to be 6 further evaluated and stabilized.       ____________________________________________   FINAL CLINICAL IMPRESSION(S) / ED DIAGNOSES  Final diagnoses:  Gross hematuria     ED Discharge Orders    None      *Please note:  Terry Arroyo was evaluated in Emergency Department on 09/12/2020 for the symptoms described in the  history of present illness. She was evaluated in the context of the global COVID-19 pandemic, which necessitated consideration that the patient might be at risk for infection with the SARS-CoV-2 virus that  causes COVID-19. Institutional protocols and algorithms that pertain to the evaluation of patients at risk for COVID-19 are in a state of rapid change based on information released by regulatory bodies including the CDC and federal and state organizations. These policies and algorithms were followed during the patient's care in the ED.  Some ED evaluations and interventions may be delayed as a result of limited staffing during and the pandemic.*   Note:  This document was prepared using Dragon voice recognition software and may include unintentional dictation errors.    Nena Polio, MD 09/12/20 1731    Nena Polio, MD 09/12/20 [phone]

## 2020-09-12 NOTE — ED Notes (Signed)
Pt urinated. Bloody. Sample sent to lab.

## 2020-09-12 NOTE — ED Notes (Addendum)
Pt laying calmly in bed. Pt has on sunglasses. Pt denies fever but c/o new hot flashes/chills, tenderness at R flank/back at biopsy site, and bright red blood in her urine since the biopsy. Skin around site appropriate color and warmth. Tender. No swelling noted. Site remains covered with gauze dressing. Pt requesting pain meds.

## 2020-09-12 NOTE — Progress Notes (Signed)
16:35 Arrived to room to start IV. Pt. Not in room.

## 2020-09-12 NOTE — ED Notes (Signed)
BG 238

## 2020-09-12 NOTE — ED Notes (Signed)
Pt up to restroom. Will give insulin once pt back to bed.

## 2020-09-12 NOTE — ED Notes (Signed)
Marya Amsler RN stated he placed an IV w/ [place]. Upon arrival to bedside with pain med pt already off unit at CT

## 2020-09-12 NOTE — ED Notes (Signed)
Pt done with her food tray.

## 2020-09-12 NOTE — ED Notes (Signed)
Pt remains off unit at CT. Verbal from [facility] to give pain meds once pt back and if BP remains elevated to check back in with him to further address if necessary.

## 2020-09-12 NOTE — ED Notes (Signed)
Mimi RN attempting for IV access.

## 2020-09-12 NOTE — ED Notes (Signed)
Attempted for 22g IV at L ac. Mimi RN assessing R arm for possible IV placement. Pt hard stick.

## 2020-09-13 DIAGNOSIS — I10 Essential (primary) hypertension: Secondary | ICD-10-CM | POA: Diagnosis not present

## 2020-09-13 DIAGNOSIS — I2510 Atherosclerotic heart disease of native coronary artery without angina pectoris: Secondary | ICD-10-CM | POA: Diagnosis present

## 2020-09-13 DIAGNOSIS — H5462 Unqualified visual loss, left eye, normal vision right eye: Secondary | ICD-10-CM | POA: Diagnosis present

## 2020-09-13 DIAGNOSIS — G4700 Insomnia, unspecified: Secondary | ICD-10-CM | POA: Diagnosis present

## 2020-09-13 DIAGNOSIS — I5022 Chronic systolic (congestive) heart failure: Secondary | ICD-10-CM | POA: Diagnosis present

## 2020-09-13 DIAGNOSIS — R109 Unspecified abdominal pain: Secondary | ICD-10-CM | POA: Diagnosis present

## 2020-09-13 DIAGNOSIS — E876 Hypokalemia: Secondary | ICD-10-CM | POA: Diagnosis present

## 2020-09-13 DIAGNOSIS — S37019A Minor contusion of unspecified kidney, initial encounter: Secondary | ICD-10-CM | POA: Diagnosis present

## 2020-09-13 DIAGNOSIS — N2581 Secondary hyperparathyroidism of renal origin: Secondary | ICD-10-CM | POA: Diagnosis present

## 2020-09-13 DIAGNOSIS — K219 Gastro-esophageal reflux disease without esophagitis: Secondary | ICD-10-CM | POA: Diagnosis present

## 2020-09-13 DIAGNOSIS — N184 Chronic kidney disease, stage 4 (severe): Secondary | ICD-10-CM | POA: Diagnosis present

## 2020-09-13 DIAGNOSIS — E1159 Type 2 diabetes mellitus with other circulatory complications: Secondary | ICD-10-CM | POA: Insufficient documentation

## 2020-09-13 DIAGNOSIS — R1310 Dysphagia, unspecified: Secondary | ICD-10-CM | POA: Diagnosis present

## 2020-09-13 DIAGNOSIS — E1122 Type 2 diabetes mellitus with diabetic chronic kidney disease: Secondary | ICD-10-CM | POA: Diagnosis present

## 2020-09-13 DIAGNOSIS — N99840 Postprocedural hematoma of a genitourinary system organ or structure following a genitourinary system procedure: Secondary | ICD-10-CM | POA: Diagnosis present

## 2020-09-13 DIAGNOSIS — D72829 Elevated white blood cell count, unspecified: Secondary | ICD-10-CM | POA: Diagnosis present

## 2020-09-13 DIAGNOSIS — Z20822 Contact with and (suspected) exposure to covid-19: Secondary | ICD-10-CM | POA: Diagnosis present

## 2020-09-13 DIAGNOSIS — N179 Acute kidney failure, unspecified: Secondary | ICD-10-CM | POA: Diagnosis not present

## 2020-09-13 DIAGNOSIS — I130 Hypertensive heart and chronic kidney disease with heart failure and stage 1 through stage 4 chronic kidney disease, or unspecified chronic kidney disease: Secondary | ICD-10-CM | POA: Diagnosis present

## 2020-09-13 DIAGNOSIS — J45909 Unspecified asthma, uncomplicated: Secondary | ICD-10-CM | POA: Diagnosis present

## 2020-09-13 DIAGNOSIS — E785 Hyperlipidemia, unspecified: Secondary | ICD-10-CM | POA: Diagnosis present

## 2020-09-13 DIAGNOSIS — S37011A Minor contusion of right kidney, initial encounter: Secondary | ICD-10-CM | POA: Diagnosis present

## 2020-09-13 DIAGNOSIS — Z951 Presence of aortocoronary bypass graft: Secondary | ICD-10-CM | POA: Diagnosis not present

## 2020-09-13 DIAGNOSIS — Z8673 Personal history of transient ischemic attack (TIA), and cerebral infarction without residual deficits: Secondary | ICD-10-CM | POA: Diagnosis not present

## 2020-09-13 DIAGNOSIS — E237 Disorder of pituitary gland, unspecified: Secondary | ICD-10-CM | POA: Diagnosis present

## 2020-09-13 DIAGNOSIS — R310 Gross hematuria: Secondary | ICD-10-CM | POA: Diagnosis present

## 2020-09-13 DIAGNOSIS — N1330 Unspecified hydronephrosis: Secondary | ICD-10-CM | POA: Diagnosis not present

## 2020-09-13 LAB — CBC
HCT: 30.9 % — ABNORMAL LOW (ref 36.0–46.0)
Hemoglobin: 10.4 g/dL — ABNORMAL LOW (ref 12.0–15.0)
MCH: 29.5 pg (ref 26.0–34.0)
MCHC: 33.7 g/dL (ref 30.0–36.0)
MCV: 87.5 fL (ref 80.0–100.0)
Platelets: 310 10*3/uL (ref 150–400)
RBC: 3.53 MIL/uL — ABNORMAL LOW (ref 3.87–5.11)
RDW: 14.8 % (ref 11.5–15.5)
WBC: 8.8 10*3/uL (ref 4.0–10.5)
nRBC: 0 % (ref 0.0–0.2)

## 2020-09-13 LAB — BASIC METABOLIC PANEL
Anion gap: 8 (ref 5–15)
BUN: 27 mg/dL — ABNORMAL HIGH (ref 6–20)
CO2: 25 mmol/L (ref 22–32)
Calcium: 7.7 mg/dL — ABNORMAL LOW (ref 8.9–10.3)
Chloride: 105 mmol/L (ref 98–111)
Creatinine, Ser: 3.24 mg/dL — ABNORMAL HIGH (ref 0.44–1.00)
GFR calc Af Amer: 17 mL/min — ABNORMAL LOW (ref >60)
GFR calc non Af Amer: 15 mL/min — ABNORMAL LOW (ref >60)
Glucose, Bld: 244 mg/dL — ABNORMAL HIGH (ref 70–99)
Potassium: 2.6 mmol/L — CL (ref 3.5–5.1)
Sodium: 138 mmol/L (ref 135–145)

## 2020-09-13 LAB — GLUCOSE, CAPILLARY
Glucose-Capillary: 238 mg/dL — ABNORMAL HIGH (ref 70–99)
Glucose-Capillary: 213 mg/dL — ABNORMAL HIGH (ref 70–99)
Glucose-Capillary: 290 mg/dL — ABNORMAL HIGH (ref 70–99)
Glucose-Capillary: 270 mg/dL — ABNORMAL HIGH (ref 70–99)
Glucose-Capillary: 237 mg/dL — ABNORMAL HIGH (ref 70–99)

## 2020-09-13 MED ORDER — LABETALOL HCL 5 MG/ML IV SOLN
10.0000 mg | INTRAVENOUS | Status: DC | PRN
  Administered 2020-09-13: 10 mg via INTRAVENOUS
  Filled 2020-09-13: qty 4

## 2020-09-13 MED ORDER — POTASSIUM CHLORIDE 10 MEQ/100ML IV SOLN
10.0000 meq | Freq: Once | INTRAVENOUS | Status: AC
  Administered 2020-09-13: 10 meq via INTRAVENOUS
  Filled 2020-09-13: qty 100

## 2020-09-13 MED ORDER — POTASSIUM CHLORIDE 10 MEQ/100ML IV SOLN
10.0000 meq | INTRAVENOUS | Status: AC
  Administered 2020-09-13 (×3): 10 meq via INTRAVENOUS
  Filled 2020-09-13 (×3): qty 100

## 2020-09-13 MED ORDER — POTASSIUM CHLORIDE CRYS ER 20 MEQ PO TBCR
40.0000 meq | EXTENDED_RELEASE_TABLET | Freq: Once | ORAL | Status: AC
  Administered 2020-09-13: 40 meq via ORAL
  Filled 2020-09-13: qty 2

## 2020-09-13 NOTE — ED Notes (Signed)
Pt transported to floor by Melody, RN.

## 2020-09-13 NOTE — ED Notes (Signed)
Pt resting calmly in bed. Denies pain currently. Rachael Fee NP notified of continued elevated BP. Awaiting orders.

## 2020-09-13 NOTE — Progress Notes (Signed)
Terry Arroyo  MRN: [id number]  DOB/AGE: 1961-02-27 59 y.o.  Primary Care Physician:Anderson, Ocie Cornfield, MD  Admit date: 09/12/2020  Chief Complaint:  Chief Complaint  Patient presents with  . Post-op Problem  . Hematuria    S-Pt presented on  09/12/2020 with  Chief Complaint  Patient presents with  . Post-op Problem  . Hematuria  . [name] is a 59 year old African-American female with a past medical history of coronary artery disease s/p CABG in 2018, CVA, CKD, hypertension, hyperlipidemia, diabetes mellitus type 2, degenerative tumor s/p removal in 1992, blindness in the left eye who was admitted on September 24 with chief complaint of hematuria . History of present illness date back to September 23 when patient had a renal biopsy for nephrotic range proteinuria/worsening of GFR. Patient then developed hematuria and came to the ER.  Upon evaluation in the ER patient had an ultrasound and later CT scan done which showed patient had hematoma and patient was admitted for further care.Patient main complaint in today's visit was my urine has now cleared up I feel better.No complaint of chest pain/shortness of breath/abdominal pain.   Medications . amLODipine  10 mg Oral Daily  . atorvastatin  80 mg Oral Daily  . carvedilol  12.5 mg Oral BID WC  . fluticasone furoate-vilanterol  1 puff Inhalation Daily  . insulin aspart  0-5 Units Subcutaneous QHS  . insulin aspart  0-9 Units Subcutaneous TID WC  . isosorbide mononitrate  30 mg Oral Daily  . mirtazapine  30 mg Oral QHS  . topiramate  25 mg Oral BID  . valACYclovir  500 mg Oral BID         WUJ:WJXBJ from the symptoms mentioned above,there are no other symptoms referable to all systems reviewed.  Physical Exam: Vital signs in last 24 hours: Pulse Rate:  [65-78] 72 (09/25 1701) Resp:  [17-19] 18 (09/25 0259) BP: (123-203)/(62-93) 166/85 (09/25 1701) SpO2:  [96 %-98 %] 98 % (09/25 0259) Weight change:      Intake/Output from previous day: No intake/output data recorded. Total I/O In: 382.2 [IV Piggyback:382.2] Out: -    Physical Exam: General- pt is awake,alert, oriented to time place and person Resp- No acute REsp distress, CTA B/L NO Rhonchi CVS- S1S2 regular in rate and rhythm GIT- BS+, soft, NT, ND EXT- NO LE Edema, Cyanosis   Lab Results: CBC Recent Labs    09/12/20 0821 09/13/20 0400  WBC 11.2* 8.8  HGB 11.3* 10.4*  HCT 32.3* 30.9*  PLT 342 310    BMET Recent Labs    09/12/20 0821 09/13/20 0400  NA 135 138  K 3.3* 2.6*  CL 100 105  CO2 24 25  GLUCOSE 290* 244*  BUN 24* 27*  CREATININE 3.52* 3.24*  CALCIUM 8.3* 7.7*    Creatinine trend 2021 3.5==>3.2 1.7--2.2  in March admission  2020 1.3--1.6   MICRO Recent Results (from the past 240 hour(s))  Respiratory Panel by RT PCR (Flu A&B, Covid) - Nasopharyngeal Swab     Status: None   Collection Time: 09/12/20  4:04 PM   Specimen: Nasopharyngeal Swab  Result Value Ref Range Status   SARS Coronavirus 2 by RT PCR NEGATIVE NEGATIVE Final    Comment: (NOTE) SARS-CoV-2 target nucleic acids are NOT DETECTED.  The SARS-CoV-2 RNA is generally detectable in upper respiratoy specimens during the acute phase of infection. The lowest concentration of SARS-CoV-2 viral copies this assay can detect is 131 copies/mL. A negative result does  not preclude SARS-Cov-2 infection and should not be used as the sole basis for treatment or other patient management decisions. A negative result may occur with  improper specimen collection/handling, submission of specimen other than nasopharyngeal swab, presence of viral mutation(s) within the areas targeted by this assay, and inadequate number of viral copies (<131 copies/mL). A negative result must be combined with clinical observations, patient history, and epidemiological information. The expected result is Negative.  Fact Sheet for Patients:   PinkCheek.be  Fact Sheet for Healthcare Providers:  GravelBags.it  This test is no t yet approved or cleared by the [place] FDA and  has been authorized for detection and/or diagnosis of SARS-CoV-2 by FDA under an Emergency Use Authorization (EUA). This EUA will remain  in effect (meaning this test can be used) for the duration of the COVID-19 declaration under Section 564(b)(1) of the Act, 21 U.S.C. section 360bbb-3(b)(1), unless the authorization is terminated or revoked sooner.     Influenza A by PCR NEGATIVE NEGATIVE Final   Influenza B by PCR NEGATIVE NEGATIVE Final    Comment: (NOTE) The Xpert Xpress SARS-CoV-2/FLU/RSV assay is intended as an aid in  the diagnosis of influenza from Nasopharyngeal swab specimens and  should not be used as a sole basis for treatment. Nasal washings and  aspirates are unacceptable for Xpert Xpress SARS-CoV-2/FLU/RSV  testing.  Fact Sheet for Patients: PinkCheek.be  Fact Sheet for Healthcare Providers: GravelBags.it  This test is not yet approved or cleared by the [place] FDA and  has been authorized for detection and/or diagnosis of SARS-CoV-2 by  FDA under an Emergency Use Authorization (EUA). This EUA will remain  in effect (meaning this test can be used) for the duration of the  Covid-19 declaration under Section 564(b)(1) of the Act, 21  U.S.C. section 360bbb-3(b)(1), unless the authorization is  terminated or revoked. Performed at [facility], [address] [place], [place] [address]       Lab Results  Component Value Date   CALCIUM 7.7 (L) 09/13/2020   PHOS 3.9 03/02/2020    U/s done yesterday Right Kidney: Length: 10.3 cm. No evidence of perinephric hematoma post recent biopsy. Slight prominence of the renal calices without renal pelvis dilatation. No focal renal abnormality.  Left  Kidney: Length: 10.8 cm. Mild calyceal dilatation without renal pelvis distension or frank hydronephrosis. No focal renal abnormality.   CT scan done yesterday Stable bilateral hydronephrosis and slightly prominent extra renal pelves. No significant hydroureter. Small area of high attenuation in the right collecting system could be a small amount of blood/hematoma. No renal or ureteral calculi. Left renal artery calcifications are noted.  There is a small right-sided perinephric hematoma and perinephric air both of which are not unexpected.       Impression:   Ms. Sheller is a 59 year old African-American female with a past medical history of coronary artery disease s/p CABG in 2018, CVA, CKD, hypertension, hyperlipidemia, diabetes mellitus type 2, degenerative tumor s/p removal in 1992, blindness in the left eye who was admitted on September 24 with chief complaint of  hematuria  AKI on CKD Leukocytosis Hypertensive urgency  1)Renal  AKI secondary to ATN Patient has AKI on CKD Patient has CKD stage IV Patient has CKD stage IV most likely secondary to diabetes mellitus  Patient AKI is minimally better   2)HTN Patient blood pressure is now much better controlled   3)Anemia of chronic disease  HGb at goal (9--11)   4) secondary hyperparathyroidism -  CKD Mineral-Bone Disorder   Secondary Hyperparathyroidism present Patient does have secondary hypertension as patient intact PTH level is elevated to 166 pg/mL as an outpatient   Phosphorus at goal.   5) hematuria Patient has hematuria secondary to recent biopsy    6) electrolytes   sodium Normonatremic   potassium Hypokalemia We will replete   7)Acid base Co2 at goal   8) proteinuria Patient does have a history of nephrotic range proteinuria Patient is now status post biopsy Patient currently not on any ACE/ ARB secondary to AKI Biopsy results are pending We will continue current treatment  9)  bilateral hydronephrosis Patient AKI is improving Patient might need urology consult   Plan:  AKI is improving Hematuria is better We will follow hemoglobin-for the need of PRBC, no need at this time We will continue current treatment for now    Lucielle Vokes s [facility] 09/13/2020, 5:23 PM

## 2020-09-13 NOTE — Progress Notes (Signed)
PROGRESS NOTE    Terry Arroyo  IRW:[id number] DOB: January 07, 1961 DOA: 09/12/2020 PCP: Kirk Ruths, MD    Assessment & Plan:   Active Problems:   Hematuria  Hematuria: gross. UA is shows few bacteria, red color. Urine cx is pending. Likely secondary to kidney biopsy on 09/11/20. [place] of kidneys shows no perinephric hematoma or no [place] evidence of complication of post renal biopsy. CT abd/pelvis   stable bilateral hydronephrosis and slightly prominent extra renal pelves. No significant hydroureter. Small area of high attenuation in the right collecting system could be a small amount of blood/hematoma. Small right-sided perinephric hematoma and perinephric air both of which are not unexpected. No large retroperitoneal hematoma. Nephro consulted    AKI on CKDIV: etiology unclear. Cr is trending down today. Kidney biopsy results pending. Continue on IVFs. Nephro consulted. Will hold home dose of ACE-I, lasix secondary to AKI  Hypokalemia: KCl repleted. Will continue to monitor   Leukocytosis: resolved  DM2: poorly controlled w/ HbA1c 10.2 in March 2021. Will continue on SSI w/ accuchecks  HTN: uncontrolled. HTN urgency has resolved. Will continue on home dose of carvedilol, amlodipine, imdur. IV hydralazine prn   CAD: s/p CABG in 2018. Will hold home dose of aspirin, ACE-I secondary to hematuria & AKI on CKD. Will continue on home dose of amlodipine, carvedilol, imdur  HLD: will continue on statin   Hx of pituitary tumor: s/p removal in 1992 which left the pt blind in the left eye  Insomnia: continue on home dose of mirtazapine    DVT prophylaxis: SCDs Code Status: full  Family Communication:  Disposition Plan: likely d/c back home   Consultants:   nephro    Procedures:   Antimicrobials:    Subjective: Pt c/o flank pain but improved from yesterday   Objective: Vitals:   09/13/20 0254 09/13/20 0259 09/13/20 0358 09/13/20 0802  BP: (!) 187/93  123/62 (!)  152/83  Pulse:  76 74 71  Resp:  18    Temp:      TempSrc:      SpO2:  98%    Weight:      Height:       No intake or output data in the 24 hours ending 09/13/20 0817 Filed Weights   09/12/20 0811  Weight: 82.1 kg    Examination:  General exam: Appears calm and comfortable  Respiratory system: Clear to auscultation. Respiratory effort normal. Cardiovascular system: S1 & S2 +. No rubs, gallops or clicks. No pedal edema. Gastrointestinal system: Abdomen is nondistended, soft and nontender. Normal bowel sounds heard. Central nervous system: Alert and oriented.  Psychiatry: Judgement and insight appear normal. Mood & affect appropriate.     Data Reviewed: I have personally reviewed following labs and imaging studies  CBC: Recent Labs  Lab 09/11/20 0933 09/12/20 0821 09/13/20 0400  WBC 9.9 11.2* 8.8  NEUTROABS  --  8.5*  --   HGB 13.1 11.3* 10.4*  HCT 36.6 32.3* 30.9*  MCV 83.4 85.0 87.5  PLT 387 342 761   Basic Metabolic Panel: Recent Labs  Lab 09/12/20 0821 09/13/20 0400  NA 135 138  K 3.3* 2.6*  CL 100 105  CO2 24 25  GLUCOSE 290* 244*  BUN 24* 27*  CREATININE 3.52* 3.24*  CALCIUM 8.3* 7.7*   GFR: Estimated Creatinine Clearance: 20.4 mL/min (A) (by C-G formula based on SCr of 3.24 mg/dL (H)). Liver Function Tests: Recent Labs  Lab 09/12/20 0821  AST 13*  ALT 10  ALKPHOS 74  BILITOT 0.6  PROT 6.4*  ALBUMIN 2.3*   Recent Labs  Lab 09/12/20 0821  LIPASE 30   No results for input(s): AMMONIA in the last 168 hours. Coagulation Profile: Recent Labs  Lab 09/11/20 0933  INR 1.0   Cardiac Enzymes: No results for input(s): CKTOTAL, CKMB, CKMBINDEX, TROPONINI in the last 168 hours. BNP (last 3 results) No results for input(s): PROBNP in the last 8760 hours. HbA1C: No results for input(s): HGBA1C in the last 72 hours. CBG: Recent Labs  Lab 09/11/20 0932  GLUCAP 301*   Lipid Profile: No results for input(s): CHOL, HDL, LDLCALC, TRIG,  CHOLHDL, LDLDIRECT in the last 72 hours. Thyroid Function Tests: No results for input(s): TSH, T4TOTAL, FREET4, T3FREE, THYROIDAB in the last 72 hours. Anemia Panel: No results for input(s): VITAMINB12, FOLATE, FERRITIN, TIBC, IRON, RETICCTPCT in the last 72 hours. Sepsis Labs: No results for input(s): PROCALCITON, LATICACIDVEN in the last 168 hours.  Recent Results (from the past 240 hour(s))  Respiratory Panel by RT PCR (Flu A&B, Covid) - Nasopharyngeal Swab     Status: None   Collection Time: 09/12/20  4:04 PM   Specimen: Nasopharyngeal Swab  Result Value Ref Range Status   SARS Coronavirus 2 by RT PCR NEGATIVE NEGATIVE Final    Comment: (NOTE) SARS-CoV-2 target nucleic acids are NOT DETECTED.  The SARS-CoV-2 RNA is generally detectable in upper respiratoy specimens during the acute phase of infection. The lowest concentration of SARS-CoV-2 viral copies this assay can detect is 131 copies/mL. A negative result does not preclude SARS-Cov-2 infection and should not be used as the sole basis for treatment or other patient management decisions. A negative result may occur with  improper specimen collection/handling, submission of specimen other than nasopharyngeal swab, presence of viral mutation(s) within the areas targeted by this assay, and inadequate number of viral copies (<131 copies/mL). A negative result must be combined with clinical observations, patient history, and epidemiological information. The expected result is Negative.  Fact Sheet for Patients:  PinkCheek.be  Fact Sheet for Healthcare Providers:  GravelBags.it  This test is no t yet approved or cleared by the [place] FDA and  has been authorized for detection and/or diagnosis of SARS-CoV-2 by FDA under an Emergency Use Authorization (EUA). This EUA will remain  in effect (meaning this test can be used) for the duration of the COVID-19 declaration  under Section 564(b)(1) of the Act, 21 U.S.C. section 360bbb-3(b)(1), unless the authorization is terminated or revoked sooner.     Influenza A by PCR NEGATIVE NEGATIVE Final   Influenza B by PCR NEGATIVE NEGATIVE Final    Comment: (NOTE) The Xpert Xpress SARS-CoV-2/FLU/RSV assay is intended as an aid in  the diagnosis of influenza from Nasopharyngeal swab specimens and  should not be used as a sole basis for treatment. Nasal washings and  aspirates are unacceptable for Xpert Xpress SARS-CoV-2/FLU/RSV  testing.  Fact Sheet for Patients: PinkCheek.be  Fact Sheet for Healthcare Providers: GravelBags.it  This test is not yet approved or cleared by the [place] FDA and  has been authorized for detection and/or diagnosis of SARS-CoV-2 by  FDA under an Emergency Use Authorization (EUA). This EUA will remain  in effect (meaning this test can be used) for the duration of the  Covid-19 declaration under Section 564(b)(1) of the Act, 21  U.S.C. section 360bbb-3(b)(1), unless the authorization is  terminated or revoked. Performed at [facility], [address], [place], [place] [address]  Radiology Studies: CT ABDOMEN PELVIS WO CONTRAST  Result Date: 09/12/2020 CLINICAL DATA:  History of recent right kidney biopsy. Patient with flank pain and hematuria. EXAM: CT ABDOMEN AND PELVIS WITHOUT CONTRAST TECHNIQUE: Multidetector CT imaging of the abdomen and pelvis was performed following the standard protocol without IV contrast. COMPARISON:  03/01/2020 FINDINGS: Lower chest: The lung bases are clear of an acute process. No pleural effusion or pulmonary lesions. Stable age advanced aortic and coronary artery calcifications and evidence of prior coronary artery bypass surgery. No pericardial effusion. The esophagus is grossly normal. Hepatobiliary: No hepatic lesions or intrahepatic biliary dilatation. The  gallbladder appears normal. Pancreas: No mass, inflammation or ductal dilatation. Spleen: Normal size.  No focal lesions. Adrenals/Urinary Tract: The adrenal glands are unremarkable and stable. Stable bilateral hydronephrosis and slightly prominent extra renal pelves. No significant hydroureter. Small area of high attenuation in the right collecting system could be a small amount of blood/hematoma. No renal or ureteral calculi. Left renal artery calcifications are noted. There is a small right-sided perinephric hematoma and perinephric air both of which are not unexpected. The bladder is unremarkable.  No bladder calculi, mass or hematoma. Stomach/Bowel: The stomach, duodenum, small bowel and colon are grossly normal without oral contrast. No acute inflammatory changes, mass lesions or obstructive findings. The terminal ileum and appendix are normal. Vascular/Lymphatic: Age advanced atherosclerotic calcifications involving the aorta and branch vessels. No aneurysm. Stable borderline enlarged retroperitoneal lymph nodes. Reproductive: The uterus and ovaries are unremarkable. Other: No pelvic mass or adenopathy. No free pelvic fluid collections. No inguinal mass or adenopathy. No abdominal wall hernia or subcutaneous lesions. Musculoskeletal: No significant bony findings. IMPRESSION: 1. Stable bilateral hydronephrosis and slightly prominent extra renal pelves. No significant hydroureter. Small area of high attenuation in the right collecting system could be a small amount of blood/hematoma. 2. Small right-sided perinephric hematoma and perinephric air both of which are not unexpected. No large retroperitoneal hematoma. 3. No renal or ureteral calculi. 4. Age advanced atherosclerotic calcifications involving the aorta and branch vessels. 5. Aortic atherosclerosis. Aortic Atherosclerosis (ICD10-I70.0). Electronically Signed   By: Marijo Sanes M.D.   On: 09/12/2020 18:00   US Abdomen Complete  Result Date:  09/12/2020 CLINICAL DATA:  Ultrasound guided right renal biopsy yesterday. Diffuse abdominal and right flank pain. EXAM: ABDOMEN ULTRASOUND COMPLETE ABDOMEN PELVIS LIMITED COMPARISON:  Noncontrast abdominal CT 03/01/2020 FINDINGS: Abdomen: Gallbladder: Physiologically distended. No gallstones or wall thickening visualized. No sonographic Murphy sign noted by sonographer. Common bile duct: Diameter: 3 mm, normal. Liver: No focal lesion identified. Heterogeneously increased in parenchymal echogenicity. No perihepatic fluid collection or sonographic evidence of hepatic injury. Portal vein is patent on color Doppler imaging with normal direction of blood flow towards the liver. IVC: No abnormality visualized. Pancreas: Visualized portion unremarkable. No peripancreatic collection visualized by ultrasound. Spleen: Size and appearance within normal limits. Right Kidney: Length: 10.3 cm. No evidence of perinephric hematoma post recent biopsy. Slight prominence of the renal calices without renal pelvis dilatation. No focal renal abnormality. Left Kidney: Length: 10.8 cm. Mild calyceal dilatation without renal pelvis distension or frank hydronephrosis. No focal renal abnormality. Abdominal aorta: No aneurysm visualized.  Atherosclerotic plaque. Other findings: No visualized free fluid or ascites. Pelvis: Bladder: Physiologically distended without wall thickening or intraluminal debris. Other findings: No pelvic ascites. IMPRESSION: 1. No sonographic evidence of complication post right renal biopsy yesterday. No evidence perinephric hematoma. If there is persistent clinical concern for post biopsy injury, recommend further evaluation with CT. 2. No  evidence of peripancreatic inflammation by ultrasound. 3. No abdominal or pelvic ascites. 4. No debris in the urinary bladder. Electronically Signed   By: Keith Rake M.D.   On: 09/12/2020 15:59   US PELVIS LIMITED (TRANSABDOMINAL ONLY)  Result Date: 09/12/2020 CLINICAL  DATA:  Ultrasound guided right renal biopsy yesterday. Diffuse abdominal and right flank pain. EXAM: ABDOMEN ULTRASOUND COMPLETE ABDOMEN PELVIS LIMITED COMPARISON:  Noncontrast abdominal CT 03/01/2020 FINDINGS: Abdomen: Gallbladder: Physiologically distended. No gallstones or wall thickening visualized. No sonographic Murphy sign noted by sonographer. Common bile duct: Diameter: 3 mm, normal. Liver: No focal lesion identified. Heterogeneously increased in parenchymal echogenicity. No perihepatic fluid collection or sonographic evidence of hepatic injury. Portal vein is patent on color Doppler imaging with normal direction of blood flow towards the liver. IVC: No abnormality visualized. Pancreas: Visualized portion unremarkable. No peripancreatic collection visualized by ultrasound. Spleen: Size and appearance within normal limits. Right Kidney: Length: 10.3 cm. No evidence of perinephric hematoma post recent biopsy. Slight prominence of the renal calices without renal pelvis dilatation. No focal renal abnormality. Left Kidney: Length: 10.8 cm. Mild calyceal dilatation without renal pelvis distension or frank hydronephrosis. No focal renal abnormality. Abdominal aorta: No aneurysm visualized.  Atherosclerotic plaque. Other findings: No visualized free fluid or ascites. Pelvis: Bladder: Physiologically distended without wall thickening or intraluminal debris. Other findings: No pelvic ascites. IMPRESSION: 1. No sonographic evidence of complication post right renal biopsy yesterday. No evidence perinephric hematoma. If there is persistent clinical concern for post biopsy injury, recommend further evaluation with CT. 2. No evidence of peripancreatic inflammation by ultrasound. 3. No abdominal or pelvic ascites. 4. No debris in the urinary bladder. Electronically Signed   By: Keith Rake M.D.   On: 09/12/2020 15:59   US BIOPSY (KIDNEY)  Result Date: 09/11/2020 INDICATION: Chronic kidney disease, proteinuria,  hypertension and diabetes. The patient requires renal biopsy. EXAM: ULTRASOUND GUIDED CORE BIOPSY OF RIGHT KIDNEY MEDICATIONS: None. ANESTHESIA/SEDATION: Fentanyl 100 mcg IV; Versed 1.0 mg IV Moderate Sedation Time:  10 minutes. The patient was continuously monitored during the procedure by the interventional radiology nurse under my direct supervision. PROCEDURE: The procedure, risks, benefits, and alternatives were explained to the patient. Questions regarding the procedure were encouraged and answered. The patient understands and consents to the procedure. A time-out was performed prior to initiating the procedure. Ultrasound was performed of both kidneys in a prone position. The right flank region was prepped with chlorhexidine in a sterile fashion, and a sterile drape was applied covering the operative field. A sterile gown and sterile gloves were used for the procedure. Local anesthesia was provided with 1% Lidocaine. A 15 gauge trocar needle was advanced to the margin of lower pole cortex of the right kidney. After confirming needle tip position, 2 separate coaxial 16 gauge core biopsy samples were obtained. Material was submitted on saline soaked Telfa gauze. A slurry of Gel-Foam pledgets was injected via the outer needle as the needle was retracted. Additional ultrasound was performed. COMPLICATIONS: None immediate. FINDINGS: Both kidneys were well-visualized by ultrasound. The left kidney demonstrates more significant cortical atrophy at the level of the lower pole compared to the right and the right was therefore chosen for sampling. Solid core biopsy samples were obtained. IMPRESSION: Ultrasound-guided core biopsy performed at the level of right lower pole renal cortex. Electronically Signed   By: Aletta Edouard M.D.   On: 09/11/2020 14:19        Scheduled Meds: . amLODipine  10 mg Oral Daily  .  atorvastatin  80 mg Oral Daily  . carvedilol  12.5 mg Oral BID WC  . fluticasone  furoate-vilanterol  1 puff Inhalation Daily  . insulin aspart  0-5 Units Subcutaneous QHS  . insulin aspart  0-9 Units Subcutaneous TID WC  . isosorbide mononitrate  30 mg Oral Daily  . mirtazapine  30 mg Oral QHS  . topiramate  25 mg Oral BID  . valACYclovir  500 mg Oral BID   Continuous Infusions: . sodium chloride Stopped (09/12/20 1600)  . sodium chloride 50 mL/hr at 09/12/20 1750  . potassium chloride 10 mEq (09/13/20 0801)     LOS: 0 days    Time spent: 32 mins    Wyvonnia Dusky, MD [facility] Pager [phone]  If 7PM-7AM, please contact night-coverage [email or web address] 09/13/2020, 8:17 AM

## 2020-09-13 NOTE — ED Notes (Signed)
Report to brandy, rn.  

## 2020-09-13 NOTE — ED Notes (Signed)
Paged brenda morrison, [phone] twice to notify of critical potassium 2.6. awaiting answer, first page at [phone].

## 2020-09-13 NOTE — ED Notes (Signed)
This RN gave report to [employer] April

## 2020-09-13 NOTE — ED Notes (Signed)
Pillows provided, blood pressure improved.

## 2020-09-14 DIAGNOSIS — N179 Acute kidney failure, unspecified: Secondary | ICD-10-CM

## 2020-09-14 DIAGNOSIS — R1310 Dysphagia, unspecified: Secondary | ICD-10-CM

## 2020-09-14 DIAGNOSIS — R310 Gross hematuria: Secondary | ICD-10-CM

## 2020-09-14 DIAGNOSIS — S37011A Minor contusion of right kidney, initial encounter: Secondary | ICD-10-CM

## 2020-09-14 DIAGNOSIS — N1330 Unspecified hydronephrosis: Secondary | ICD-10-CM

## 2020-09-14 LAB — CBC
HCT: 35.3 % — ABNORMAL LOW (ref 36.0–46.0)
Hemoglobin: 11.7 g/dL — ABNORMAL LOW (ref 12.0–15.0)
MCH: 29.5 pg (ref 26.0–34.0)
MCHC: 33.1 g/dL (ref 30.0–36.0)
MCV: 89.1 fL (ref 80.0–100.0)
Platelets: 390 10*3/uL (ref 150–400)
RBC: 3.96 MIL/uL (ref 3.87–5.11)
RDW: 14.8 % (ref 11.5–15.5)
WBC: 8.9 10*3/uL (ref 4.0–10.5)
nRBC: 0 % (ref 0.0–0.2)

## 2020-09-14 LAB — URINE CULTURE

## 2020-09-14 LAB — BASIC METABOLIC PANEL
Anion gap: 10 (ref 5–15)
BUN: 30 mg/dL — ABNORMAL HIGH (ref 6–20)
CO2: 20 mmol/L — ABNORMAL LOW (ref 22–32)
Calcium: 8.1 mg/dL — ABNORMAL LOW (ref 8.9–10.3)
Chloride: 107 mmol/L (ref 98–111)
Creatinine, Ser: 3.31 mg/dL — ABNORMAL HIGH (ref 0.44–1.00)
GFR calc Af Amer: 17 mL/min — ABNORMAL LOW (ref >60)
GFR calc non Af Amer: 15 mL/min — ABNORMAL LOW (ref >60)
Glucose, Bld: 274 mg/dL — ABNORMAL HIGH (ref 70–99)
Potassium: 3.6 mmol/L (ref 3.5–5.1)
Sodium: 137 mmol/L (ref 135–145)

## 2020-09-14 LAB — GLUCOSE, CAPILLARY
Glucose-Capillary: 281 mg/dL — ABNORMAL HIGH (ref 70–99)
Glucose-Capillary: 279 mg/dL — ABNORMAL HIGH (ref 70–99)
Glucose-Capillary: 281 mg/dL — ABNORMAL HIGH (ref 70–99)

## 2020-09-14 MED ORDER — PANTOPRAZOLE SODIUM 40 MG PO TBEC
40.0000 mg | DELAYED_RELEASE_TABLET | Freq: Every day | ORAL | Status: DC
  Administered 2020-09-14 – 2020-09-15 (×2): 40 mg via ORAL
  Filled 2020-09-14 (×2): qty 1

## 2020-09-14 NOTE — Progress Notes (Signed)
Terry Arroyo  MRN: [id number]  DOB/AGE: 1961/02/21 59 y.o.  Primary Care Physician:Anderson, Ocie Cornfield, MD  Admit date: 09/12/2020  Chief Complaint:  Chief Complaint  Patient presents with   Post-op Problem   Hematuria    S-Pt presented on  09/12/2020 with  Chief Complaint  Patient presents with   Post-op Problem   Hematuria  . Patient offers no new concerns, pt informed me that her urine has now cleared up . No complaint of chest pain/shortness of breath/abdominal pain.   Medications  amLODipine  10 mg Oral Daily   atorvastatin  80 mg Oral Daily   carvedilol  12.5 mg Oral BID WC   fluticasone furoate-vilanterol  1 puff Inhalation Daily   insulin aspart  0-5 Units Subcutaneous QHS   insulin aspart  0-9 Units Subcutaneous TID WC   isosorbide mononitrate  30 mg Oral Daily   mirtazapine  30 mg Oral QHS   topiramate  25 mg Oral BID   valACYclovir  500 mg Oral BID         JOA:CZYSA from the symptoms mentioned above,there are no other symptoms referable to all systems reviewed.  Physical Exam: Vital signs in last 24 hours: Temp:  [97.9 F (36.6 C)-98.4 F (36.9 C)] 97.9 F (36.6 C) (09/26 0441) Pulse Rate:  [65-72] 70 (09/26 0441) Resp:  [17-20] 20 (09/26 0441) BP: (141-174)/(72-88) 151/72 (09/26 0441) SpO2:  [97 %-99 %] 98 % (09/26 0441) Weight change:  Last BM Date: 09/13/20  Intake/Output from previous day: 09/25 0701 - 09/26 0700 In: 2045.9 [P.O.:120; I.V.:1543.8; IV Piggyback:382.2] Out: 450 [Urine:450] Total I/O In: -  Out: 400 [Urine:400]   Physical Exam: General- pt is awake,alert, oriented to time place and person Resp- No acute REsp distress, CTA B/L NO Rhonchi CVS- S1S2 regular in rate and rhythm GIT- BS+, soft, NT, ND EXT- NO LE Edema, Cyanosis   Lab Results: CBC Recent Labs    09/13/20 0400 09/14/20 0913  WBC 8.8 8.9  HGB 10.4* 11.7*  HCT 30.9* 35.3*  PLT 310 390    BMET Recent Labs    09/13/20 0400  09/14/20 0913  NA 138 137  K 2.6* 3.6  CL 105 107  CO2 25 20*  GLUCOSE 244* 274*  BUN 27* 30*  CREATININE 3.24* 3.31*  CALCIUM 7.7* 8.1*    Creatinine trend 2021 3.5==>3.2 1.7--2.2  in March admission  2020 1.3--1.6   MICRO Recent Results (from the past 240 hour(s))  Respiratory Panel by RT PCR (Flu A&B, Covid) - Nasopharyngeal Swab     Status: None   Collection Time: 09/12/20  4:04 PM   Specimen: Nasopharyngeal Swab  Result Value Ref Range Status   SARS Coronavirus 2 by RT PCR NEGATIVE NEGATIVE Final    Comment: (NOTE) SARS-CoV-2 target nucleic acids are NOT DETECTED.  The SARS-CoV-2 RNA is generally detectable in upper respiratoy specimens during the acute phase of infection. The lowest concentration of SARS-CoV-2 viral copies this assay can detect is 131 copies/mL. A negative result does not preclude SARS-Cov-2 infection and should not be used as the sole basis for treatment or other patient management decisions. A negative result may occur with  improper specimen collection/handling, submission of specimen other than nasopharyngeal swab, presence of viral mutation(s) within the areas targeted by this assay, and inadequate number of viral copies (<131 copies/mL). A negative result must be combined with clinical observations, patient history, and epidemiological information. The expected result is Negative.  Fact Sheet for Patients:  PinkCheek.be  Fact Sheet for Healthcare Providers:  GravelBags.it  This test is no t yet approved or cleared by the [place] FDA and  has been authorized for detection and/or diagnosis of SARS-CoV-2 by FDA under an Emergency Use Authorization (EUA). This EUA will remain  in effect (meaning this test can be used) for the duration of the COVID-19 declaration under Section 564(b)(1) of the Act, 21 U.S.C. section 360bbb-3(b)(1), unless the authorization is terminated  or revoked sooner.     Influenza A by PCR NEGATIVE NEGATIVE Final   Influenza B by PCR NEGATIVE NEGATIVE Final    Comment: (NOTE) The Xpert Xpress SARS-CoV-2/FLU/RSV assay is intended as an aid in  the diagnosis of influenza from Nasopharyngeal swab specimens and  should not be used as a sole basis for treatment. Nasal washings and  aspirates are unacceptable for Xpert Xpress SARS-CoV-2/FLU/RSV  testing.  Fact Sheet for Patients: PinkCheek.be  Fact Sheet for Healthcare Providers: GravelBags.it  This test is not yet approved or cleared by the [place] FDA and  has been authorized for detection and/or diagnosis of SARS-CoV-2 by  FDA under an Emergency Use Authorization (EUA). This EUA will remain  in effect (meaning this test can be used) for the duration of the  Covid-19 declaration under Section 564(b)(1) of the Act, 21  U.S.C. section 360bbb-3(b)(1), unless the authorization is  terminated or revoked. Performed at [facility], [address], [place], [place] [address]   Urine culture     Status: Abnormal   Collection Time: 09/12/20  4:04 PM   Specimen: Urine, Random  Result Value Ref Range Status   Specimen Description   Final    URINE, RANDOM Performed at [facility], [address], [place], [place] [address]    Special Requests   Final    NONE Performed at [facility], [address] [place], [place] [address]    Culture MULTIPLE SPECIES PRESENT, SUGGEST RECOLLECTION (A)  Final   Report Status 09/14/2020 FINAL  Final      Lab Results  Component Value Date   CALCIUM 8.1 (L) 09/14/2020   PHOS 3.9 03/02/2020    U/s done yesterday Right Kidney: Length: 10.3 cm. No evidence of perinephric hematoma post recent biopsy. Slight prominence of the renal calices without renal pelvis dilatation. No focal renal abnormality.  Left Kidney: Length: 10.8 cm. Mild calyceal  dilatation without renal pelvis distension or frank hydronephrosis. No focal renal abnormality.   CT scan done yesterday Stable bilateral hydronephrosis and slightly prominent extra renal pelves. No significant hydroureter. Small area of high attenuation in the right collecting system could be a small amount of blood/hematoma. No renal or ureteral calculi. Left renal artery calcifications are noted.  There is a small right-sided perinephric hematoma and perinephric air both of which are not unexpected.       Impression:   Ms. Gielow is a 59 year old African-American female with a past medical history of coronary artery disease s/p CABG in 2018, CVA, CKD, hypertension, hyperlipidemia, diabetes mellitus type 2, degenerative tumor s/p removal in 1992, blindness in the left eye who was admitted on September 24 with chief complaint of  hematuria  AKI on CKD Leukocytosis Hypertensive urgency  1)Renal  AKI secondary to ATN Patient has AKI on CKD Patient has CKD stage IV Patient has CKD stage IV most likely secondary to diabetes mellitus  Patient AKI is stable  2)HTN Patient blood pressure is now much better controlled   3)Anemia  of chronic disease  HGb at goal (9--11) No need for PRBC/Epogen  4) secondary hyperparathyroidism -CKD Mineral-Bone Disorder   Secondary Hyperparathyroidism present Patient does have secondary hypertension as patient intact PTH level is elevated to 166 pg/mL as an outpatient   Phosphorus at goal.   5) hematuria Patient has hematuria secondary to recent biopsy Now better   6) electrolytes   sodium Normonatremic   potassium Hypokalemia Now better   7)Acid base Co2 at goal   8) proteinuria Patient does have a history of nephrotic range proteinuria Patient is now status post biopsy Patient currently not on any ACE/ ARB secondary to AKI Biopsy results are pending We will continue current treatment  9) bilateral  hydronephrosis Patient AKI is improving Etiology saw the patient recommended outpatient follow-up after Lasix scan   Plan:  AKI is improving Hematuria is better We will continue current treatment for now. In case the patient is discharged will see patient in nephrology clinic.    Amaira Safley s Theador Hawthorne 09/14/2020, 11:02 AM

## 2020-09-14 NOTE — Progress Notes (Signed)
PROGRESS NOTE    Terry Arroyo  IRC:[id number] DOB: Mar 04, 1961 DOA: 09/12/2020 PCP: Kirk Ruths, MD    Assessment & Plan:   Active Problems:   Hematuria   Kidney hematoma  Hematuria: gross. UA is shows few bacteria, red color. Urine cx is pending. Likely secondary to kidney biopsy on 09/11/20. [place] of kidneys shows no perinephric hematoma or no [place] evidence of complication of post renal biopsy. CT abd/pelvis   stable bilateral hydronephrosis and slightly prominent extra renal pelves. No significant hydroureter. Small area of high attenuation in the right collecting system could be a small amount of blood/hematoma. Small right-sided perinephric hematoma and perinephric air both of which are not unexpected. No large retroperitoneal hematoma. Nephro consulted    AKI on CKDIV: etiology unclear. Cr is trending up today. Kidney biopsy results pending. Continue on IVFs. Will hold home dose of ACE-I, lasix secondary to AKI. Nephro following and recs apprec  Dysphagia: etiology unclear. Speech consulted   Hypokalemia: KCl repleted. Will continue to monitor   Leukocytosis: resolved  DM2: poorly controlled w/ HbA1c 10.2 in March 2021. Will continue on SSI w/ accuchecks  HTN: uncontrolled. HTN urgency has resolved. Will continue on home dose of carvedilol, amlodipine, imdur. IV hydralazine prn   CAD: s/p CABG in 2018. Will hold home dose of aspirin, ACE-I secondary to hematuria & AKI on CKD. Will continue on home dose of amlodipine, carvedilol, imdur  HLD: will continue on statin   Hx of pituitary tumor: s/p removal in 1992 which left the pt blind in the left eye  Insomnia: continue on home dose of mirtazapine    DVT prophylaxis: SCDs Code Status: full  Family Communication:  Disposition Plan: likely d/c back home  Status is: Inpatient  Remains inpatient appropriate because:Ongoing diagnostic testing needed not appropriate for outpatient work up and IV treatments  appropriate due to intensity of illness or inability to take PO   Dispo: The patient is from: Home              Anticipated d/c is to: Home              Anticipated d/c date is: 1 day              Patient currently is not medically stable to d/c.     Consultants:   nephro    Procedures:   Antimicrobials:    Subjective: Pt c/o flank pain and difficulty swallowing today   Objective: Vitals:   09/13/20 2130 09/13/20 2230 09/14/20 0441 09/14/20 1209  BP: (!) 141/72 (!) 174/88 (!) 151/72 (!) 175/87  Pulse: 66 68 70 67  Resp:  20 20 16   Temp:  98.4 F (36.9 C) 97.9 F (36.6 C) 97.7 F (36.5 C)  TempSrc:    Oral  SpO2: 97% 99% 98% 100%  Weight:      Height:        Intake/Output Summary (Last 24 hours) at 09/14/2020 1421 Last data filed at 09/14/2020 0742 Gross per 24 hour  Intake 1755.5 ml  Output 850 ml  Net 905.5 ml   Filed Weights   09/12/20 0811  Weight: 82.1 kg    Examination:  General exam: Appears uncomfortable  Respiratory system: clear breath sounds b/l. No rales, rhonchi  Cardiovascular system: S1/S2+. No rubs or gallops. Gastrointestinal system: Abdomen is nondistended, soft and nontender. Normal bowel sounds heard. Central nervous system: Alert and oriented. Moves all 4 extremities  Psychiatry: Judgement and insight appear normal. Flat  mood and affect     Data Reviewed: I have personally reviewed following labs and imaging studies  CBC: Recent Labs  Lab 09/11/20 0933 09/12/20 0821 09/13/20 0400 09/14/20 0913  WBC 9.9 11.2* 8.8 8.9  NEUTROABS  --  8.5*  --   --   HGB 13.1 11.3* 10.4* 11.7*  HCT 36.6 32.3* 30.9* 35.3*  MCV 83.4 85.0 87.5 89.1  PLT 387 342 310 829   Basic Metabolic Panel: Recent Labs  Lab 09/12/20 0821 09/13/20 0400 09/14/20 0913  NA 135 138 137  K 3.3* 2.6* 3.6  CL 100 105 107  CO2 24 25 20*  GLUCOSE 290* 244* 274*  BUN 24* 27* 30*  CREATININE 3.52* 3.24* 3.31*  CALCIUM 8.3* 7.7* 8.1*   GFR: Estimated  Creatinine Clearance: 20 mL/min (A) (by C-G formula based on SCr of 3.31 mg/dL (H)). Liver Function Tests: Recent Labs  Lab 09/12/20 0821  AST 13*  ALT 10  ALKPHOS 74  BILITOT 0.6  PROT 6.4*  ALBUMIN 2.3*   Recent Labs  Lab 09/12/20 0821  LIPASE 30   No results for input(s): AMMONIA in the last 168 hours. Coagulation Profile: Recent Labs  Lab 09/11/20 0933  INR 1.0   Cardiac Enzymes: No results for input(s): CKTOTAL, CKMB, CKMBINDEX, TROPONINI in the last 168 hours. BNP (last 3 results) No results for input(s): PROBNP in the last 8760 hours. HbA1C: No results for input(s): HGBA1C in the last 72 hours. CBG: Recent Labs  Lab 09/13/20 1329 09/13/20 1652 09/13/20 2242 09/14/20 0745 09/14/20 1205  GLUCAP 290* 270* 237* 281* 279*   Lipid Profile: No results for input(s): CHOL, HDL, LDLCALC, TRIG, CHOLHDL, LDLDIRECT in the last 72 hours. Thyroid Function Tests: No results for input(s): TSH, T4TOTAL, FREET4, T3FREE, THYROIDAB in the last 72 hours. Anemia Panel: No results for input(s): VITAMINB12, FOLATE, FERRITIN, TIBC, IRON, RETICCTPCT in the last 72 hours. Sepsis Labs: No results for input(s): PROCALCITON, LATICACIDVEN in the last 168 hours.  Recent Results (from the past 240 hour(s))  Respiratory Panel by RT PCR (Flu A&B, Covid) - Nasopharyngeal Swab     Status: None   Collection Time: 09/12/20  4:04 PM   Specimen: Nasopharyngeal Swab  Result Value Ref Range Status   SARS Coronavirus 2 by RT PCR NEGATIVE NEGATIVE Final    Comment: (NOTE) SARS-CoV-2 target nucleic acids are NOT DETECTED.  The SARS-CoV-2 RNA is generally detectable in upper respiratoy specimens during the acute phase of infection. The lowest concentration of SARS-CoV-2 viral copies this assay can detect is 131 copies/mL. A negative result does not preclude SARS-Cov-2 infection and should not be used as the sole basis for treatment or other patient management decisions. A negative result may  occur with  improper specimen collection/handling, submission of specimen other than nasopharyngeal swab, presence of viral mutation(s) within the areas targeted by this assay, and inadequate number of viral copies (<131 copies/mL). A negative result must be combined with clinical observations, patient history, and epidemiological information. The expected result is Negative.  Fact Sheet for Patients:  PinkCheek.be  Fact Sheet for Healthcare Providers:  GravelBags.it  This test is no t yet approved or cleared by the [place] FDA and  has been authorized for detection and/or diagnosis of SARS-CoV-2 by FDA under an Emergency Use Authorization (EUA). This EUA will remain  in effect (meaning this test can be used) for the duration of the COVID-19 declaration under Section 564(b)(1) of the Act, 21 U.S.C. section 360bbb-3(b)(1), unless the  authorization is terminated or revoked sooner.     Influenza A by PCR NEGATIVE NEGATIVE Final   Influenza B by PCR NEGATIVE NEGATIVE Final    Comment: (NOTE) The Xpert Xpress SARS-CoV-2/FLU/RSV assay is intended as an aid in  the diagnosis of influenza from Nasopharyngeal swab specimens and  should not be used as a sole basis for treatment. Nasal washings and  aspirates are unacceptable for Xpert Xpress SARS-CoV-2/FLU/RSV  testing.  Fact Sheet for Patients: PinkCheek.be  Fact Sheet for Healthcare Providers: GravelBags.it  This test is not yet approved or cleared by the [place] FDA and  has been authorized for detection and/or diagnosis of SARS-CoV-2 by  FDA under an Emergency Use Authorization (EUA). This EUA will remain  in effect (meaning this test can be used) for the duration of the  Covid-19 declaration under Section 564(b)(1) of the Act, 21  U.S.C. section 360bbb-3(b)(1), unless the authorization is  terminated or  revoked. Performed at [facility], [address], [place], [place] [address]   Urine culture     Status: Abnormal   Collection Time: 09/12/20  4:04 PM   Specimen: Urine, Random  Result Value Ref Range Status   Specimen Description   Final    URINE, RANDOM Performed at [facility], [address], [name], [place] [address]    Special Requests   Final    NONE Performed at [facility], [address] [place], [place] [address]    Culture MULTIPLE SPECIES PRESENT, SUGGEST RECOLLECTION (A)  Final   Report Status 09/14/2020 FINAL  Final         Radiology Studies: CT ABDOMEN PELVIS WO CONTRAST  Result Date: 09/12/2020 CLINICAL DATA:  History of recent right kidney biopsy. Patient with flank pain and hematuria. EXAM: CT ABDOMEN AND PELVIS WITHOUT CONTRAST TECHNIQUE: Multidetector CT imaging of the abdomen and pelvis was performed following the standard protocol without IV contrast. COMPARISON:  03/01/2020 FINDINGS: Lower chest: The lung bases are clear of an acute process. No pleural effusion or pulmonary lesions. Stable age advanced aortic and coronary artery calcifications and evidence of prior coronary artery bypass surgery. No pericardial effusion. The esophagus is grossly normal. Hepatobiliary: No hepatic lesions or intrahepatic biliary dilatation. The gallbladder appears normal. Pancreas: No mass, inflammation or ductal dilatation. Spleen: Normal size.  No focal lesions. Adrenals/Urinary Tract: The adrenal glands are unremarkable and stable. Stable bilateral hydronephrosis and slightly prominent extra renal pelves. No significant hydroureter. Small area of high attenuation in the right collecting system could be a small amount of blood/hematoma. No renal or ureteral calculi. Left renal artery calcifications are noted. There is a small right-sided perinephric hematoma and perinephric air both of which are not unexpected. The bladder is unremarkable.  No  bladder calculi, mass or hematoma. Stomach/Bowel: The stomach, duodenum, small bowel and colon are grossly normal without oral contrast. No acute inflammatory changes, mass lesions or obstructive findings. The terminal ileum and appendix are normal. Vascular/Lymphatic: Age advanced atherosclerotic calcifications involving the aorta and branch vessels. No aneurysm. Stable borderline enlarged retroperitoneal lymph nodes. Reproductive: The uterus and ovaries are unremarkable. Other: No pelvic mass or adenopathy. No free pelvic fluid collections. No inguinal mass or adenopathy. No abdominal wall hernia or subcutaneous lesions. Musculoskeletal: No significant bony findings. IMPRESSION: 1. Stable bilateral hydronephrosis and slightly prominent extra renal pelves. No significant hydroureter. Small area of high attenuation in the right collecting system could be a small amount of blood/hematoma. 2. Small right-sided perinephric hematoma  and perinephric air both of which are not unexpected. No large retroperitoneal hematoma. 3. No renal or ureteral calculi. 4. Age advanced atherosclerotic calcifications involving the aorta and branch vessels. 5. Aortic atherosclerosis. Aortic Atherosclerosis (ICD10-I70.0). Electronically Signed   By: Marijo Sanes M.D.   On: 09/12/2020 18:00   US Abdomen Complete  Result Date: 09/12/2020 CLINICAL DATA:  Ultrasound guided right renal biopsy yesterday. Diffuse abdominal and right flank pain. EXAM: ABDOMEN ULTRASOUND COMPLETE ABDOMEN PELVIS LIMITED COMPARISON:  Noncontrast abdominal CT 03/01/2020 FINDINGS: Abdomen: Gallbladder: Physiologically distended. No gallstones or wall thickening visualized. No sonographic Murphy sign noted by sonographer. Common bile duct: Diameter: 3 mm, normal. Liver: No focal lesion identified. Heterogeneously increased in parenchymal echogenicity. No perihepatic fluid collection or sonographic evidence of hepatic injury. Portal vein is patent on color Doppler  imaging with normal direction of blood flow towards the liver. IVC: No abnormality visualized. Pancreas: Visualized portion unremarkable. No peripancreatic collection visualized by ultrasound. Spleen: Size and appearance within normal limits. Right Kidney: Length: 10.3 cm. No evidence of perinephric hematoma post recent biopsy. Slight prominence of the renal calices without renal pelvis dilatation. No focal renal abnormality. Left Kidney: Length: 10.8 cm. Mild calyceal dilatation without renal pelvis distension or frank hydronephrosis. No focal renal abnormality. Abdominal aorta: No aneurysm visualized.  Atherosclerotic plaque. Other findings: No visualized free fluid or ascites. Pelvis: Bladder: Physiologically distended without wall thickening or intraluminal debris. Other findings: No pelvic ascites. IMPRESSION: 1. No sonographic evidence of complication post right renal biopsy yesterday. No evidence perinephric hematoma. If there is persistent clinical concern for post biopsy injury, recommend further evaluation with CT. 2. No evidence of peripancreatic inflammation by ultrasound. 3. No abdominal or pelvic ascites. 4. No debris in the urinary bladder. Electronically Signed   By: Keith Rake M.D.   On: 09/12/2020 15:59   US PELVIS LIMITED (TRANSABDOMINAL ONLY)  Result Date: 09/12/2020 CLINICAL DATA:  Ultrasound guided right renal biopsy yesterday. Diffuse abdominal and right flank pain. EXAM: ABDOMEN ULTRASOUND COMPLETE ABDOMEN PELVIS LIMITED COMPARISON:  Noncontrast abdominal CT 03/01/2020 FINDINGS: Abdomen: Gallbladder: Physiologically distended. No gallstones or wall thickening visualized. No sonographic Murphy sign noted by sonographer. Common bile duct: Diameter: 3 mm, normal. Liver: No focal lesion identified. Heterogeneously increased in parenchymal echogenicity. No perihepatic fluid collection or sonographic evidence of hepatic injury. Portal vein is patent on color Doppler imaging with normal  direction of blood flow towards the liver. IVC: No abnormality visualized. Pancreas: Visualized portion unremarkable. No peripancreatic collection visualized by ultrasound. Spleen: Size and appearance within normal limits. Right Kidney: Length: 10.3 cm. No evidence of perinephric hematoma post recent biopsy. Slight prominence of the renal calices without renal pelvis dilatation. No focal renal abnormality. Left Kidney: Length: 10.8 cm. Mild calyceal dilatation without renal pelvis distension or frank hydronephrosis. No focal renal abnormality. Abdominal aorta: No aneurysm visualized.  Atherosclerotic plaque. Other findings: No visualized free fluid or ascites. Pelvis: Bladder: Physiologically distended without wall thickening or intraluminal debris. Other findings: No pelvic ascites. IMPRESSION: 1. No sonographic evidence of complication post right renal biopsy yesterday. No evidence perinephric hematoma. If there is persistent clinical concern for post biopsy injury, recommend further evaluation with CT. 2. No evidence of peripancreatic inflammation by ultrasound. 3. No abdominal or pelvic ascites. 4. No debris in the urinary bladder. Electronically Signed   By: Keith Rake M.D.   On: 09/12/2020 15:59        Scheduled Meds: . amLODipine  10 mg Oral Daily  . atorvastatin  80 mg Oral Daily  . carvedilol  12.5 mg Oral BID WC  . fluticasone furoate-vilanterol  1 puff Inhalation Daily  . insulin aspart  0-5 Units Subcutaneous QHS  . insulin aspart  0-9 Units Subcutaneous TID WC  . isosorbide mononitrate  30 mg Oral Daily  . mirtazapine  30 mg Oral QHS  . pantoprazole  40 mg Oral Daily  . topiramate  25 mg Oral BID  . valACYclovir  500 mg Oral BID   Continuous Infusions: . sodium chloride 50 mL/hr at 09/14/20 0300     LOS: 1 day    Time spent: 30 mins    Wyvonnia Dusky, MD [facility] Pager [phone]  If 7PM-7AM, please contact  night-coverage [email or web address] 09/14/2020, 2:21 PM

## 2020-09-14 NOTE — Consult Note (Signed)
I have been asked to see the patient by Dr. Trinidad Curet, for evaluation and management of bilateral hydronephrosis.  History of present illness: 33F with chronic renal insufficiency who underwent right renal biopsy on 9/23 for further evaluation.  The next day she presented to the ED with worsening pain and hematuria.  She had a subsequent bleed noted on CT scan with a perinephric hematoma and clot within the collecting system.  She also had noted some mild chronic hydro v. Peri-pelvic cyst v. Extrarenal pelvis.   URology consulted for eval of hydro.  This AM she feels better, but still having some right flank pressure and discomfort.  Hematuria has resolved.  No fevers.   She has h/o CABG, CVA, DM and HTN.  Review of systems: A 12 point comprehensive review of systems was obtained and is negative unless otherwise stated in the history of present illness.  Patient Active Problem List   Diagnosis Date Noted  . Kidney hematoma 09/13/2020  . Essential hypertension   . AKI (acute kidney injury) ([place])   . Hematuria 09/12/2020  . Calf tenderness   . Acute on chronic heart failure ([place]) 02/27/2020  . Leg edema   . DM2 (diabetes mellitus, type 2) ([place]) 02/26/2020  . CKD (chronic kidney disease), stage III 02/26/2020  . Hyponatremia 02/26/2020  . Anasarca 02/26/2020  . Proteinuria 02/26/2020  . Hypoalbuminemia 02/26/2020  . Elevated troponin 02/26/2020  . Acute on chronic systolic CHF (congestive heart failure) ([place]) 02/26/2020  . Hyperglycemia   . Nausea   . Hypertensive emergency   . Hypertensive urgency 11/24/2019  . Nonspecific chest pain 11/23/2019    No current facility-administered medications on file prior to encounter.   Current Outpatient Medications on File Prior to Encounter  Medication Sig Dispense Refill  . amLODipine (NORVASC) 10 MG tablet Take 1 tablet (10 mg total) by mouth daily. 30 tablet 0  . aspirin EC 81 MG EC tablet Take 1 tablet (81 mg total) by mouth  daily. 30 tablet 0  . atorvastatin (LIPITOR) 80 MG tablet Take 1 tablet (80 mg total) by mouth daily at 6 PM. 30 tablet 0  . atropine 1 % ophthalmic solution Place 1 drop into the right eye 2 (two) times daily.    . brimonidine (ALPHAGAN) 0.2 % ophthalmic solution Place 1 drop into the right eye 3 (three) times daily.    . carvedilol (COREG) 12.5 MG tablet Take 12.5 mg by mouth 2 (two) times daily with a meal.    . cyclobenzaprine (FLEXERIL) 5 MG tablet Take 5 mg by mouth 2 (two) times daily as needed for muscle spasms.    . Fluticasone-Salmeterol (ADVAIR) 250-50 MCG/DOSE AEPB Inhale 1 puff into the lungs 2 (two) times daily.    . furosemide (LASIX) 40 MG tablet Take 1 tablet (40 mg total) by mouth daily. 30 tablet 1  . insulin aspart protamine- aspart (NOVOLOG MIX 70/30) (70-30) 100 UNIT/ML injection Inject 0.4 mLs (40 Units total) into the skin 2 (two) times daily with a meal. (Patient taking differently: Inject 45 Units into the skin 2 (two) times daily with a meal. ) 10 mL 0  . isosorbide mononitrate (IMDUR) 30 MG 24 hr tablet Take 30 mg by mouth daily.    Marland Kitchen ketorolac (ACULAR) 0.5 % ophthalmic solution Place 1 drop into the right eye 4 (four) times daily.    . metoprolol succinate (TOPROL-XL) 50 MG 24 hr tablet Take 50 mg by mouth daily.    . mirtazapine (  REMERON) 30 MG tablet Take 1 tablet (30 mg total) by mouth at bedtime. 30 tablet 0  . mometasone-formoterol (DULERA) 200-5 MCG/ACT AERO Inhale 2 puffs into the lungs 2 (two) times daily.    Marland Kitchen ofloxacin (OCUFLOX) 0.3 % ophthalmic solution Place 1 drop into the right eye 4 (four) times daily.    . prednisoLONE acetate (PRED FORTE) 1 % ophthalmic suspension Place 1 drop into the right eye 4 (four) times daily.    Marland Kitchen topiramate (TOPAMAX) 25 MG capsule Take 25 mg by mouth daily.    . valACYclovir (VALTREX) 500 MG tablet Take 1 tablet by mouth 2 (two) times daily.      Past Medical History:  Diagnosis Date  . Blind left eye   . Brain tumor ([place])  1986  . CHF (congestive heart failure) ([place])   . Diabetes mellitus without complication ([place])   . Hyperlipidemia   . Hypertension   . MI (myocardial infarction) ([place]) 2018    Past Surgical History:  Procedure Laterality Date  . BRAIN SURGERY    . BREAST EXCISIONAL BIOPSY Left yrs ago   benign  . CESAREAN SECTION      Social History   Tobacco Use  . Smoking status: Former Research scientist (life sciences)  . Smokeless tobacco: Never Used  Substance Use Topics  . Alcohol use: Not Currently  . Drug use: Not Currently    Family History  Problem Relation Age of Onset  . CAD Mother   . CAD Brother   . Breast cancer Sister 33    PE: Vitals:   09/13/20 2114 09/13/20 2130 09/13/20 2230 09/14/20 0441  BP: (!) 156/80 (!) 141/72 (!) 174/88 (!) 151/72  Pulse: 65 66 68 70  Resp: 17  20 20   Temp: 98.4 F (36.9 C)  98.4 F (36.9 C) 97.9 F (36.6 C)  TempSrc: Oral     SpO2: 99% 97% 99% 98%  Weight:      Height:       Patient appears to be in no acute distress  patient is alert and oriented x3 Atraumatic normocephalic head No cervical or supraclavicular lymphadenopathy appreciated No increased work of breathing, no audible wheezes/rhonchi Regular sinus rhythm/rate Abdomen is soft, nontender, nondistended, Right CVA tenderness Lower extremities are symmetric without appreciable edema Grossly neurologically intact No identifiable skin lesions  Recent Labs    09/11/20 0933 09/12/20 0821 09/13/20 0400  WBC 9.9 11.2* 8.8  HGB 13.1 11.3* 10.4*  HCT 36.6 32.3* 30.9*   Recent Labs    09/12/20 0821 09/13/20 0400  NA 135 138  K 3.3* 2.6*  CL 100 105  CO2 24 25  GLUCOSE 290* 244*  BUN 24* 27*  CREATININE 3.52* 3.24*  CALCIUM 8.3* 7.7*   Recent Labs    09/11/20 0933  INR 1.0   No results for input(s): LABURIN in the last 72 hours. Results for orders placed or performed during the hospital encounter of 09/12/20  Respiratory Panel by RT PCR (Flu A&B, Covid) - Nasopharyngeal Swab      Status: None   Collection Time: 09/12/20  4:04 PM   Specimen: Nasopharyngeal Swab  Result Value Ref Range Status   SARS Coronavirus 2 by RT PCR NEGATIVE NEGATIVE Final    Comment: (NOTE) SARS-CoV-2 target nucleic acids are NOT DETECTED.  The SARS-CoV-2 RNA is generally detectable in upper respiratoy specimens during the acute phase of infection. The lowest concentration of SARS-CoV-2 viral copies this assay can detect is 131 copies/mL. A negative result does  not preclude SARS-Cov-2 infection and should not be used as the sole basis for treatment or other patient management decisions. A negative result may occur with  improper specimen collection/handling, submission of specimen other than nasopharyngeal swab, presence of viral mutation(s) within the areas targeted by this assay, and inadequate number of viral copies (<131 copies/mL). A negative result must be combined with clinical observations, patient history, and epidemiological information. The expected result is Negative.  Fact Sheet for Patients:  PinkCheek.be  Fact Sheet for Healthcare Providers:  GravelBags.it  This test is no t yet approved or cleared by the [place] FDA and  has been authorized for detection and/or diagnosis of SARS-CoV-2 by FDA under an Emergency Use Authorization (EUA). This EUA will remain  in effect (meaning this test can be used) for the duration of the COVID-19 declaration under Section 564(b)(1) of the Act, 21 U.S.C. section 360bbb-3(b)(1), unless the authorization is terminated or revoked sooner.     Influenza A by PCR NEGATIVE NEGATIVE Final   Influenza B by PCR NEGATIVE NEGATIVE Final    Comment: (NOTE) The Xpert Xpress SARS-CoV-2/FLU/RSV assay is intended as an aid in  the diagnosis of influenza from Nasopharyngeal swab specimens and  should not be used as a sole basis for treatment. Nasal washings and  aspirates are  unacceptable for Xpert Xpress SARS-CoV-2/FLU/RSV  testing.  Fact Sheet for Patients: PinkCheek.be  Fact Sheet for Healthcare Providers: GravelBags.it  This test is not yet approved or cleared by the [place] FDA and  has been authorized for detection and/or diagnosis of SARS-CoV-2 by  FDA under an Emergency Use Authorization (EUA). This EUA will remain  in effect (meaning this test can be used) for the duration of the  Covid-19 declaration under Section 564(b)(1) of the Act, 21  U.S.C. section 360bbb-3(b)(1), unless the authorization is  terminated or revoked. Performed at [facility], [address] [place], [place] [address]     Imaging: I have reviewed the CT scan obtained in the ED with pertinent findings as noted in the HPI and discussed with the patient.  Imp:  [id number] with severe vascular disease due to DM and poorly controlled HTN with subsequent renal insufficiency who had a perinephric bleed after core needle biopy of right kidney for further evaluation of kidney insufficieny.  She has chronic dilation of her kidney's which is otherwise poorly characterized because of the lack of IV contrast.  Her renal failure has improved slight from day prior.  Recommendations: Unclear if the dilated kidneys are contributing to her renal failure, but to sort this out, it may be worth obtaining a lasix renogram once she has recovered from the peri-nephric bleed.  She is improving otherwise and I expect will be discharged soon.  The lasix renogram can be obtained by her medical doctors (nephrology) as outpatient.  She can then f/u with urology in clinic if indicated.  Thanks for this consult, we will sign-off.  Please reconsult with any additional questions.   Ardis Hughs

## 2020-09-15 DIAGNOSIS — K219 Gastro-esophageal reflux disease without esophagitis: Secondary | ICD-10-CM

## 2020-09-15 LAB — CBC
HCT: 28.9 % — ABNORMAL LOW (ref 36.0–46.0)
Hemoglobin: 10.3 g/dL — ABNORMAL LOW (ref 12.0–15.0)
MCH: 30.6 pg (ref 26.0–34.0)
MCHC: 35.6 g/dL (ref 30.0–36.0)
MCV: 85.8 fL (ref 80.0–100.0)
Platelets: 335 10*3/uL (ref 150–400)
RBC: 3.37 MIL/uL — ABNORMAL LOW (ref 3.87–5.11)
RDW: 14.9 % (ref 11.5–15.5)
WBC: 8.7 10*3/uL (ref 4.0–10.5)
nRBC: 0 % (ref 0.0–0.2)

## 2020-09-15 LAB — GLUCOSE, CAPILLARY
Glucose-Capillary: 188 mg/dL — ABNORMAL HIGH (ref 70–99)
Glucose-Capillary: 257 mg/dL — ABNORMAL HIGH (ref 70–99)
Glucose-Capillary: 222 mg/dL — ABNORMAL HIGH (ref 70–99)

## 2020-09-15 LAB — BASIC METABOLIC PANEL
Anion gap: 10 (ref 5–15)
BUN: 33 mg/dL — ABNORMAL HIGH (ref 6–20)
CO2: 20 mmol/L — ABNORMAL LOW (ref 22–32)
Calcium: 7.8 mg/dL — ABNORMAL LOW (ref 8.9–10.3)
Chloride: 108 mmol/L (ref 98–111)
Creatinine, Ser: 3.14 mg/dL — ABNORMAL HIGH (ref 0.44–1.00)
GFR calc Af Amer: 18 mL/min — ABNORMAL LOW (ref >60)
GFR calc non Af Amer: 16 mL/min — ABNORMAL LOW (ref >60)
Glucose, Bld: 228 mg/dL — ABNORMAL HIGH (ref 70–99)
Potassium: 3.4 mmol/L — ABNORMAL LOW (ref 3.5–5.1)
Sodium: 138 mmol/L (ref 135–145)

## 2020-09-15 MED ORDER — ASPIRIN 81 MG PO TBEC
81.0000 mg | DELAYED_RELEASE_TABLET | Freq: Every day | ORAL | 0 refills | Status: DC
Start: 2020-09-15 — End: 2021-02-10

## 2020-09-15 MED ORDER — PANTOPRAZOLE SODIUM 40 MG PO TBEC: 40.0000 mg | DELAYED_RELEASE_TABLET | Freq: Every day | ORAL | 0 refills | Status: DC

## 2020-09-15 NOTE — Discharge Summary (Addendum)
Physician Discharge Summary  Terry Arroyo DJS:[id number] DOB: 30-Jul-1961 DOA: 09/12/2020  PCP: Kirk Ruths, MD  Admit date: 09/12/2020 Discharge date: 09/15/2020  Admitted From: home Disposition:  Home   Recommendations for Outpatient Follow-up:  1. Follow up with PCP in 1-2 weeks 2. F/u nephro within 1 week   Home Health: no  Equipment/Devices:  Discharge Condition: stable CODE STATUS: full  Diet recommendation: carb modified/renal diet   Brief/Interim Summary: 59 y/o F w/ PMH of CAD s/p CABG in 2018, CVA (in 2014), CKD, asthma, HTN, HLD, DM2, pituitary tumor s/p removal (in 1992, benign), blindness of left eye who presents w/ hematuria and right flank pain x day prior to admission. The flank pain is on the right side that is sharp, intermittent w/ radiation to the lower abd. Laying on her stomach makes the pain better and laying on her right side makes the pain worse. The severity is currently 6/10. Of note, pt had a kidney biopsy the day prior to admission for worsening Cr/GFR of unknown etiology. Also, pt c/o chills, nausea and vomiting. Pt denies any fever, sweating, cough, chest pain, dysuria, urinary urgency, diarrhea or constipation.   Pt was found to have a small right sided perinephric hematoma w/ stable b/l hydronephrosis. The hematoma was likely secondary to recent kidney biopsy.  Of note, pt did not require a blood transfusion while inpatient. Pt was treated conservatively w/ IVFs and pain meds prn. Nephro as well urology saw the pt inpatient. No need any further inpatient work up for hydronephrosis but can f/u outpatient as per urology. For more information, please see other progress notes.    Discharge Diagnoses:  Active Problems:   Hematuria   Kidney hematoma  Hematuria: gross. UA is shows few bacteria, red color. Urine cx is pending. Likely secondary to kidney biopsy on 09/11/20. [place] of kidneys shows no perinephric hematoma or no [place] evidence of complication of  post renal biopsy. CT abd/pelvis   stable bilateral hydronephrosis and slightly prominent extra renal pelves. No significant hydroureter. Small area of high attenuation in the right collecting system could be a small amount of blood/hematoma. Small right-sided perinephric hematoma and perinephric air both of which are not unexpected. No large retroperitoneal hematoma. Nephro following and recs apprec    AKI on CKDIV: etiology unclear. Cr is trending up today. Kidney biopsy results pending. Continue on IVFs. Will hold home dose of ACE-I, lasix secondary to AKI. Nephro following and recs apprec  Dysphagia: likely secondary to GERD.  No aspiration noted as per speech. Started on PPI   Hypokalemia: will continue to monitor   Leukocytosis: resolved  DM2: poorly controlled w/ HbA1c 10.2 in March 2021. Will continue on SSI w/ accuchecks  HTN: uncontrolled. HTN urgency has resolved. Will continue on home dose of carvedilol, amlodipine, imdur. IV hydralazine prn   CAD: s/p CABG in 2018. Will hold home dose of aspirin, ACE-I secondary to hematuria & AKI on CKD. Will continue on home dose of amlodipine, carvedilol, imdur  HLD: will continue on statin   Hx of pituitary tumor: s/p removal in 1992 which left the pt blind in the left eye  Insomnia: continue on home dose of mirtazapine  Discharge Instructions  Discharge Instructions    Diet - low sodium heart healthy   Complete by: As directed    Diet Carb Modified   Complete by: As directed    Discharge instructions   Complete by: As directed    F/u nephro, Dr. Juleen China  within in 1 week. F/u PCP in 1-2 weeks   Increase activity slowly   Complete by: As directed      Allergies as of 09/15/2020      Reactions   Hydralazine Shortness Of Breath, Swelling   Body aches   Lisinopril Swelling   Shellfish Allergy Anaphylaxis   Shrimp/lobster  Shrimp/lobster    Kiwi Extract Swelling   Metformin Other (See Comments)   Other  reaction(s): GI Intolerance   Tape Itching   Use paper tape whenever possible      Medication List    TAKE these medications   amLODipine 10 MG tablet Commonly known as: NORVASC Take 1 tablet (10 mg total) by mouth daily.   aspirin 81 MG EC tablet Take 1 tablet (81 mg total) by mouth daily. Do not take this medication for 1 week and then restart. What changed: additional instructions   atorvastatin 80 MG tablet Commonly known as: LIPITOR Take 1 tablet (80 mg total) by mouth daily at 6 PM.   atropine 1 % ophthalmic solution Place 1 drop into the right eye 2 (two) times daily.   brimonidine 0.2 % ophthalmic solution Commonly known as: ALPHAGAN Place 1 drop into the right eye 3 (three) times daily.   carvedilol 12.5 MG tablet Commonly known as: COREG Take 12.5 mg by mouth 2 (two) times daily with a meal.   cyclobenzaprine 5 MG tablet Commonly known as: FLEXERIL Take 5 mg by mouth 2 (two) times daily as needed for muscle spasms.   Fluticasone-Salmeterol 250-50 MCG/DOSE Aepb Commonly known as: ADVAIR Inhale 1 puff into the lungs 2 (two) times daily.   furosemide 40 MG tablet Commonly known as: LASIX Take 1 tablet (40 mg total) by mouth daily.   insulin aspart protamine- aspart (70-30) 100 UNIT/ML injection Commonly known as: NOVOLOG MIX 70/30 Inject 0.4 mLs (40 Units total) into the skin 2 (two) times daily with a meal. What changed: how much to take   isosorbide mononitrate 30 MG 24 hr tablet Commonly known as: IMDUR Take 30 mg by mouth daily.   ketorolac 0.5 % ophthalmic solution Commonly known as: ACULAR Place 1 drop into the right eye 4 (four) times daily.   metoprolol succinate 50 MG 24 hr tablet Commonly known as: TOPROL-XL Take 50 mg by mouth daily.   mirtazapine 30 MG tablet Commonly known as: REMERON Take 1 tablet (30 mg total) by mouth at bedtime.   mometasone-formoterol 200-5 MCG/ACT Aero Commonly known as: DULERA Inhale 2 puffs into the lungs 2  (two) times daily.   ofloxacin 0.3 % ophthalmic solution Commonly known as: OCUFLOX Place 1 drop into the right eye 4 (four) times daily.   pantoprazole 40 MG tablet Commonly known as: PROTONIX Take 1 tablet (40 mg total) by mouth daily. Start taking on: September 16, 2020   prednisoLONE acetate 1 % ophthalmic suspension Commonly known as: PRED FORTE Place 1 drop into the right eye 4 (four) times daily.   topiramate 25 MG capsule Commonly known as: TOPAMAX Take 25 mg by mouth daily.   valACYclovir 500 MG tablet Commonly known as: VALTREX Take 1 tablet by mouth 2 (two) times daily.       Allergies  Allergen Reactions  . Hydralazine Shortness Of Breath and Swelling    Body aches  . Lisinopril Swelling  . Shellfish Allergy Anaphylaxis    Shrimp/lobster  Shrimp/lobster    . Kiwi Extract Swelling  . Metformin Other (See Comments)    Other  reaction(s): GI Intolerance  . Tape Itching    Use paper tape whenever possible    Consultations:  nephro  uro    Procedures/Studies: CT ABDOMEN PELVIS WO CONTRAST  Result Date: 09/12/2020 CLINICAL DATA:  History of recent right kidney biopsy. Patient with flank pain and hematuria. EXAM: CT ABDOMEN AND PELVIS WITHOUT CONTRAST TECHNIQUE: Multidetector CT imaging of the abdomen and pelvis was performed following the standard protocol without IV contrast. COMPARISON:  03/01/2020 FINDINGS: Lower chest: The lung bases are clear of an acute process. No pleural effusion or pulmonary lesions. Stable age advanced aortic and coronary artery calcifications and evidence of prior coronary artery bypass surgery. No pericardial effusion. The esophagus is grossly normal. Hepatobiliary: No hepatic lesions or intrahepatic biliary dilatation. The gallbladder appears normal. Pancreas: No mass, inflammation or ductal dilatation. Spleen: Normal size.  No focal lesions. Adrenals/Urinary Tract: The adrenal glands are unremarkable and stable. Stable bilateral  hydronephrosis and slightly prominent extra renal pelves. No significant hydroureter. Small area of high attenuation in the right collecting system could be a small amount of blood/hematoma. No renal or ureteral calculi. Left renal artery calcifications are noted. There is a small right-sided perinephric hematoma and perinephric air both of which are not unexpected. The bladder is unremarkable.  No bladder calculi, mass or hematoma. Stomach/Bowel: The stomach, duodenum, small bowel and colon are grossly normal without oral contrast. No acute inflammatory changes, mass lesions or obstructive findings. The terminal ileum and appendix are normal. Vascular/Lymphatic: Age advanced atherosclerotic calcifications involving the aorta and branch vessels. No aneurysm. Stable borderline enlarged retroperitoneal lymph nodes. Reproductive: The uterus and ovaries are unremarkable. Other: No pelvic mass or adenopathy. No free pelvic fluid collections. No inguinal mass or adenopathy. No abdominal wall hernia or subcutaneous lesions. Musculoskeletal: No significant bony findings. IMPRESSION: 1. Stable bilateral hydronephrosis and slightly prominent extra renal pelves. No significant hydroureter. Small area of high attenuation in the right collecting system could be a small amount of blood/hematoma. 2. Small right-sided perinephric hematoma and perinephric air both of which are not unexpected. No large retroperitoneal hematoma. 3. No renal or ureteral calculi. 4. Age advanced atherosclerotic calcifications involving the aorta and branch vessels. 5. Aortic atherosclerosis. Aortic Atherosclerosis (ICD10-I70.0). Electronically Signed   By: Marijo Sanes M.D.   On: 09/12/2020 18:00   US Abdomen Complete  Result Date: 09/12/2020 CLINICAL DATA:  Ultrasound guided right renal biopsy yesterday. Diffuse abdominal and right flank pain. EXAM: ABDOMEN ULTRASOUND COMPLETE ABDOMEN PELVIS LIMITED COMPARISON:  Noncontrast abdominal CT 03/01/2020  FINDINGS: Abdomen: Gallbladder: Physiologically distended. No gallstones or wall thickening visualized. No sonographic Murphy sign noted by sonographer. Common bile duct: Diameter: 3 mm, normal. Liver: No focal lesion identified. Heterogeneously increased in parenchymal echogenicity. No perihepatic fluid collection or sonographic evidence of hepatic injury. Portal vein is patent on color Doppler imaging with normal direction of blood flow towards the liver. IVC: No abnormality visualized. Pancreas: Visualized portion unremarkable. No peripancreatic collection visualized by ultrasound. Spleen: Size and appearance within normal limits. Right Kidney: Length: 10.3 cm. No evidence of perinephric hematoma post recent biopsy. Slight prominence of the renal calices without renal pelvis dilatation. No focal renal abnormality. Left Kidney: Length: 10.8 cm. Mild calyceal dilatation without renal pelvis distension or frank hydronephrosis. No focal renal abnormality. Abdominal aorta: No aneurysm visualized.  Atherosclerotic plaque. Other findings: No visualized free fluid or ascites. Pelvis: Bladder: Physiologically distended without wall thickening or intraluminal debris. Other findings: No pelvic ascites. IMPRESSION: 1. No sonographic evidence of complication post  right renal biopsy yesterday. No evidence perinephric hematoma. If there is persistent clinical concern for post biopsy injury, recommend further evaluation with CT. 2. No evidence of peripancreatic inflammation by ultrasound. 3. No abdominal or pelvic ascites. 4. No debris in the urinary bladder. Electronically Signed   By: Keith Rake M.D.   On: 09/12/2020 15:59   US PELVIS LIMITED (TRANSABDOMINAL ONLY)  Result Date: 09/12/2020 CLINICAL DATA:  Ultrasound guided right renal biopsy yesterday. Diffuse abdominal and right flank pain. EXAM: ABDOMEN ULTRASOUND COMPLETE ABDOMEN PELVIS LIMITED COMPARISON:  Noncontrast abdominal CT 03/01/2020 FINDINGS: Abdomen:  Gallbladder: Physiologically distended. No gallstones or wall thickening visualized. No sonographic Murphy sign noted by sonographer. Common bile duct: Diameter: 3 mm, normal. Liver: No focal lesion identified. Heterogeneously increased in parenchymal echogenicity. No perihepatic fluid collection or sonographic evidence of hepatic injury. Portal vein is patent on color Doppler imaging with normal direction of blood flow towards the liver. IVC: No abnormality visualized. Pancreas: Visualized portion unremarkable. No peripancreatic collection visualized by ultrasound. Spleen: Size and appearance within normal limits. Right Kidney: Length: 10.3 cm. No evidence of perinephric hematoma post recent biopsy. Slight prominence of the renal calices without renal pelvis dilatation. No focal renal abnormality. Left Kidney: Length: 10.8 cm. Mild calyceal dilatation without renal pelvis distension or frank hydronephrosis. No focal renal abnormality. Abdominal aorta: No aneurysm visualized.  Atherosclerotic plaque. Other findings: No visualized free fluid or ascites. Pelvis: Bladder: Physiologically distended without wall thickening or intraluminal debris. Other findings: No pelvic ascites. IMPRESSION: 1. No sonographic evidence of complication post right renal biopsy yesterday. No evidence perinephric hematoma. If there is persistent clinical concern for post biopsy injury, recommend further evaluation with CT. 2. No evidence of peripancreatic inflammation by ultrasound. 3. No abdominal or pelvic ascites. 4. No debris in the urinary bladder. Electronically Signed   By: Keith Rake M.D.   On: 09/12/2020 15:59   US BIOPSY (KIDNEY)  Result Date: 09/11/2020 INDICATION: Chronic kidney disease, proteinuria, hypertension and diabetes. The patient requires renal biopsy. EXAM: ULTRASOUND GUIDED CORE BIOPSY OF RIGHT KIDNEY MEDICATIONS: None. ANESTHESIA/SEDATION: Fentanyl 100 mcg IV; Versed 1.0 mg IV Moderate Sedation Time:  10  minutes. The patient was continuously monitored during the procedure by the interventional radiology nurse under my direct supervision. PROCEDURE: The procedure, risks, benefits, and alternatives were explained to the patient. Questions regarding the procedure were encouraged and answered. The patient understands and consents to the procedure. A time-out was performed prior to initiating the procedure. Ultrasound was performed of both kidneys in a prone position. The right flank region was prepped with chlorhexidine in a sterile fashion, and a sterile drape was applied covering the operative field. A sterile gown and sterile gloves were used for the procedure. Local anesthesia was provided with 1% Lidocaine. A 15 gauge trocar needle was advanced to the margin of lower pole cortex of the right kidney. After confirming needle tip position, 2 separate coaxial 16 gauge core biopsy samples were obtained. Material was submitted on saline soaked Telfa gauze. A slurry of Gel-Foam pledgets was injected via the outer needle as the needle was retracted. Additional ultrasound was performed. COMPLICATIONS: None immediate. FINDINGS: Both kidneys were well-visualized by ultrasound. The left kidney demonstrates more significant cortical atrophy at the level of the lower pole compared to the right and the right was therefore chosen for sampling. Solid core biopsy samples were obtained. IMPRESSION: Ultrasound-guided core biopsy performed at the level of right lower pole renal cortex. Electronically Signed   By: Eulas Post  Kathlene Cote M.D.   On: 09/11/2020 14:19      Subjective: Pt c/o burning sensation her throat area    Discharge Exam: Vitals:   09/15/20 1138 09/15/20 1204  BP: (!) 181/86 (!) 158/84  Pulse: 70   Resp: 16   Temp: 98.3 F (36.8 C)   SpO2: 100%    Vitals:   09/14/20 1929 09/15/20 0536 09/15/20 1138 09/15/20 1204  BP: (!) 152/80 136/70 (!) 181/86 (!) 158/84  Pulse: 75 64 70   Resp: 18 18 16    Temp: 97.9  F (36.6 C) 97.7 F (36.5 C) 98.3 F (36.8 C)   TempSrc: Oral Oral    SpO2: 100% 98% 100%   Weight:      Height:        General: Pt is alert, awake, not in acute distress Cardiovascular: S1/S2 +, no rubs, no gallops Respiratory: CTA bilaterally, no wheezing, no rhonchi Abdominal: Soft, NT, ND, bowel sounds + Extremities:  no cyanosis    The results of significant diagnostics from this hospitalization (including imaging, microbiology, ancillary and laboratory) are listed below for reference.     Microbiology: Recent Results (from the past 240 hour(s))  Respiratory Panel by RT PCR (Flu A&B, Covid) - Nasopharyngeal Swab     Status: None   Collection Time: 09/12/20  4:04 PM   Specimen: Nasopharyngeal Swab  Result Value Ref Range Status   SARS Coronavirus 2 by RT PCR NEGATIVE NEGATIVE Final    Comment: (NOTE) SARS-CoV-2 target nucleic acids are NOT DETECTED.  The SARS-CoV-2 RNA is generally detectable in upper respiratoy specimens during the acute phase of infection. The lowest concentration of SARS-CoV-2 viral copies this assay can detect is 131 copies/mL. A negative result does not preclude SARS-Cov-2 infection and should not be used as the sole basis for treatment or other patient management decisions. A negative result may occur with  improper specimen collection/handling, submission of specimen other than nasopharyngeal swab, presence of viral mutation(s) within the areas targeted by this assay, and inadequate number of viral copies (<131 copies/mL). A negative result must be combined with clinical observations, patient history, and epidemiological information. The expected result is Negative.  Fact Sheet for Patients:  PinkCheek.be  Fact Sheet for Healthcare Providers:  GravelBags.it  This test is no t yet approved or cleared by the [place] FDA and  has been authorized for detection and/or diagnosis of  SARS-CoV-2 by FDA under an Emergency Use Authorization (EUA). This EUA will remain  in effect (meaning this test can be used) for the duration of the COVID-19 declaration under Section 564(b)(1) of the Act, 21 U.S.C. section 360bbb-3(b)(1), unless the authorization is terminated or revoked sooner.     Influenza A by PCR NEGATIVE NEGATIVE Final   Influenza B by PCR NEGATIVE NEGATIVE Final    Comment: (NOTE) The Xpert Xpress SARS-CoV-2/FLU/RSV assay is intended as an aid in  the diagnosis of influenza from Nasopharyngeal swab specimens and  should not be used as a sole basis for treatment. Nasal washings and  aspirates are unacceptable for Xpert Xpress SARS-CoV-2/FLU/RSV  testing.  Fact Sheet for Patients: PinkCheek.be  Fact Sheet for Healthcare Providers: GravelBags.it  This test is not yet approved or cleared by the [place] FDA and  has been authorized for detection and/or diagnosis of SARS-CoV-2 by  FDA under an Emergency Use Authorization (EUA). This EUA will remain  in effect (meaning this test can be used) for the duration of the  Covid-19 declaration under Section  564(b)(1) of the Act, 21  U.S.C. section 360bbb-3(b)(1), unless the authorization is  terminated or revoked. Performed at [facility], [address] [place], [place] [address]   Urine culture     Status: Abnormal   Collection Time: 09/12/20  4:04 PM   Specimen: Urine, Random  Result Value Ref Range Status   Specimen Description   Final    URINE, RANDOM Performed at [facility], [address] [place], [place] [address]    Special Requests   Final    NONE Performed at [facility], [address] [place], [place] [address]    Culture MULTIPLE SPECIES PRESENT, SUGGEST RECOLLECTION (A)  Final   Report Status 09/14/2020 FINAL  Final     Labs: BNP (last 3 results) Recent Labs    02/26/20 1302  BNP 610.0*    Basic Metabolic Panel: Recent Labs  Lab 09/12/20 0821 09/13/20 0400 09/14/20 0913 09/15/20 0403  NA 135 138 137 138  K 3.3* 2.6* 3.6 3.4*  CL 100 105 107 108  CO2 24 25 20* 20*  GLUCOSE 290* 244* 274* 228*  BUN 24* 27* 30* 33*  CREATININE 3.52* 3.24* 3.31* 3.14*  CALCIUM 8.3* 7.7* 8.1* 7.8*   Liver Function Tests: Recent Labs  Lab 09/12/20 0821  AST 13*  ALT 10  ALKPHOS 74  BILITOT 0.6  PROT 6.4*  ALBUMIN 2.3*   Recent Labs  Lab 09/12/20 0821  LIPASE 30   No results for input(s): AMMONIA in the last 168 hours. CBC: Recent Labs  Lab 09/11/20 0933 09/12/20 0821 09/13/20 0400 09/14/20 0913 09/15/20 0403  WBC 9.9 11.2* 8.8 8.9 8.7  NEUTROABS  --  8.5*  --   --   --   HGB 13.1 11.3* 10.4* 11.7* 10.3*  HCT 36.6 32.3* 30.9* 35.3* 28.9*  MCV 83.4 85.0 87.5 89.1 85.8  PLT 387 342 310 390 335   Cardiac Enzymes: No results for input(s): CKTOTAL, CKMB, CKMBINDEX, TROPONINI in the last 168 hours. BNP: Invalid input(s): POCBNP CBG: Recent Labs  Lab 09/14/20 0745 09/14/20 1205 09/14/20 2120 09/15/20 0805 09/15/20 1140  GLUCAP 281* 279* 281* 257* 222*   D-Dimer No results for input(s): DDIMER in the last 72 hours. Hgb A1c No results for input(s): HGBA1C in the last 72 hours. Lipid Profile No results for input(s): CHOL, HDL, LDLCALC, TRIG, CHOLHDL, LDLDIRECT in the last 72 hours. Thyroid function studies No results for input(s): TSH, T4TOTAL, T3FREE, THYROIDAB in the last 72 hours.  Invalid input(s): FREET3 Anemia work up No results for input(s): VITAMINB12, FOLATE, FERRITIN, TIBC, IRON, RETICCTPCT in the last 72 hours. Urinalysis    Component Value Date/Time   COLORURINE RED (A) 09/12/2020 0945   APPEARANCEUR CLOUDY (A) 09/12/2020 0945   LABSPEC 1.011 09/12/2020 0945   PHURINE  09/12/2020 0945    TEST NOT REPORTED DUE TO COLOR INTERFERENCE OF URINE PIGMENT   GLUCOSEU (A) 09/12/2020 0945    TEST NOT REPORTED DUE TO COLOR INTERFERENCE OF URINE  PIGMENT   HGBUR (A) 09/12/2020 0945    TEST NOT REPORTED DUE TO COLOR INTERFERENCE OF URINE PIGMENT   BILIRUBINUR (A) 09/12/2020 0945    TEST NOT REPORTED DUE TO COLOR INTERFERENCE OF URINE PIGMENT   KETONESUR (A) 09/12/2020 0945    TEST NOT REPORTED DUE TO COLOR INTERFERENCE OF URINE PIGMENT   PROTEINUR (A) 09/12/2020 0945    TEST NOT REPORTED DUE TO COLOR INTERFERENCE OF URINE PIGMENT   NITRITE (A) 09/12/2020 0945  TEST NOT REPORTED DUE TO COLOR INTERFERENCE OF URINE PIGMENT   LEUKOCYTESUR (A) 09/12/2020 0945    TEST NOT REPORTED DUE TO COLOR INTERFERENCE OF URINE PIGMENT   Sepsis Labs Invalid input(s): PROCALCITONIN,  WBC,  LACTICIDVEN Microbiology Recent Results (from the past 240 hour(s))  Respiratory Panel by RT PCR (Flu A&B, Covid) - Nasopharyngeal Swab     Status: None   Collection Time: 09/12/20  4:04 PM   Specimen: Nasopharyngeal Swab  Result Value Ref Range Status   SARS Coronavirus 2 by RT PCR NEGATIVE NEGATIVE Final    Comment: (NOTE) SARS-CoV-2 target nucleic acids are NOT DETECTED.  The SARS-CoV-2 RNA is generally detectable in upper respiratoy specimens during the acute phase of infection. The lowest concentration of SARS-CoV-2 viral copies this assay can detect is 131 copies/mL. A negative result does not preclude SARS-Cov-2 infection and should not be used as the sole basis for treatment or other patient management decisions. A negative result may occur with  improper specimen collection/handling, submission of specimen other than nasopharyngeal swab, presence of viral mutation(s) within the areas targeted by this assay, and inadequate number of viral copies (<131 copies/mL). A negative result must be combined with clinical observations, patient history, and epidemiological information. The expected result is Negative.  Fact Sheet for Patients:  PinkCheek.be  Fact Sheet for Healthcare Providers:   GravelBags.it  This test is no t yet approved or cleared by the [place] FDA and  has been authorized for detection and/or diagnosis of SARS-CoV-2 by FDA under an Emergency Use Authorization (EUA). This EUA will remain  in effect (meaning this test can be used) for the duration of the COVID-19 declaration under Section 564(b)(1) of the Act, 21 U.S.C. section 360bbb-3(b)(1), unless the authorization is terminated or revoked sooner.     Influenza A by PCR NEGATIVE NEGATIVE Final   Influenza B by PCR NEGATIVE NEGATIVE Final    Comment: (NOTE) The Xpert Xpress SARS-CoV-2/FLU/RSV assay is intended as an aid in  the diagnosis of influenza from Nasopharyngeal swab specimens and  should not be used as a sole basis for treatment. Nasal washings and  aspirates are unacceptable for Xpert Xpress SARS-CoV-2/FLU/RSV  testing.  Fact Sheet for Patients: PinkCheek.be  Fact Sheet for Healthcare Providers: GravelBags.it  This test is not yet approved or cleared by the [place] FDA and  has been authorized for detection and/or diagnosis of SARS-CoV-2 by  FDA under an Emergency Use Authorization (EUA). This EUA will remain  in effect (meaning this test can be used) for the duration of the  Covid-19 declaration under Section 564(b)(1) of the Act, 21  U.S.C. section 360bbb-3(b)(1), unless the authorization is  terminated or revoked. Performed at [facility], [address], [place], [place] [address]   Urine culture     Status: Abnormal   Collection Time: 09/12/20  4:04 PM   Specimen: Urine, Random  Result Value Ref Range Status   Specimen Description   Final    URINE, RANDOM Performed at [facility], [address] [place], [place] [address]    Special Requests   Final    NONE Performed at [facility], [address] [place], [place] [address]    Culture  MULTIPLE SPECIES PRESENT, SUGGEST RECOLLECTION (A)  Final   Report Status 09/14/2020 FINAL  Final     Time coordinating discharge: Over 30 minutes  SIGNED:   Wyvonnia Dusky, MD  [facility] 09/15/2020, 1:57 PM Pager  If 7PM-7AM, please contact night-coverage [email or web address]

## 2020-09-15 NOTE — Progress Notes (Signed)
Terry Arroyo  A and O x 4 VSS. Pt tolerating diet well. No complaints of pain or nausea. IV removed intact, prescriptions given. Pt voices understanding of discharge instructions with no further questions. Pt discharged via wheelchair with axillary.   Allergies as of 09/15/2020      Reactions   Hydralazine Shortness Of Breath, Swelling   Body aches   Lisinopril Swelling   Shellfish Allergy Anaphylaxis   Shrimp/lobster  Shrimp/lobster    Kiwi Extract Swelling   Metformin Other (See Comments)   Other reaction(s): GI Intolerance   Tape Itching   Use paper tape whenever possible      Medication List    TAKE these medications   amLODipine 10 MG tablet Commonly known as: NORVASC Take 1 tablet (10 mg total) by mouth daily.   aspirin 81 MG EC tablet Take 1 tablet (81 mg total) by mouth daily. Do not take this medication for 1 week and then restart. What changed: additional instructions   atorvastatin 80 MG tablet Commonly known as: LIPITOR Take 1 tablet (80 mg total) by mouth daily at 6 PM.   atropine 1 % ophthalmic solution Place 1 drop into the right eye 2 (two) times daily.   brimonidine 0.2 % ophthalmic solution Commonly known as: ALPHAGAN Place 1 drop into the right eye 3 (three) times daily.   carvedilol 12.5 MG tablet Commonly known as: COREG Take 12.5 mg by mouth 2 (two) times daily with a meal.   cyclobenzaprine 5 MG tablet Commonly known as: FLEXERIL Take 5 mg by mouth 2 (two) times daily as needed for muscle spasms.   Fluticasone-Salmeterol 250-50 MCG/DOSE Aepb Commonly known as: ADVAIR Inhale 1 puff into the lungs 2 (two) times daily.   furosemide 40 MG tablet Commonly known as: LASIX Take 1 tablet (40 mg total) by mouth daily.   insulin aspart protamine- aspart (70-30) 100 UNIT/ML injection Commonly known as: NOVOLOG MIX 70/30 Inject 0.4 mLs (40 Units total) into the skin 2 (two) times daily with a meal. What changed: how much to take   isosorbide  mononitrate 30 MG 24 hr tablet Commonly known as: IMDUR Take 30 mg by mouth daily.   ketorolac 0.5 % ophthalmic solution Commonly known as: ACULAR Place 1 drop into the right eye 4 (four) times daily.   metoprolol succinate 50 MG 24 hr tablet Commonly known as: TOPROL-XL Take 50 mg by mouth daily.   mirtazapine 30 MG tablet Commonly known as: REMERON Take 1 tablet (30 mg total) by mouth at bedtime.   mometasone-formoterol 200-5 MCG/ACT Aero Commonly known as: DULERA Inhale 2 puffs into the lungs 2 (two) times daily.   ofloxacin 0.3 % ophthalmic solution Commonly known as: OCUFLOX Place 1 drop into the right eye 4 (four) times daily.   pantoprazole 40 MG tablet Commonly known as: PROTONIX Take 1 tablet (40 mg total) by mouth daily. Start taking on: September 16, 2020   prednisoLONE acetate 1 % ophthalmic suspension Commonly known as: PRED FORTE Place 1 drop into the right eye 4 (four) times daily.   topiramate 25 MG capsule Commonly known as: TOPAMAX Take 25 mg by mouth daily.   valACYclovir 500 MG tablet Commonly known as: VALTREX Take 1 tablet by mouth 2 (two) times daily.       Vitals:   09/15/20 1138 09/15/20 1204  BP: (!) 181/86 (!) 158/84  Pulse: 70   Resp: 16   Temp: 98.3 F (36.8 C)   SpO2: 100%  Terry Arroyo

## 2020-09-15 NOTE — Progress Notes (Signed)
[place], [place] 09/15/20  Subjective:   Hospital day # 2 Patient is doing fair. Denies any acute complaints Has some LE edema   09/26 0701 - 09/27 0700 In: 1065.2 [I.V.:1065.2] Out: 400 [Urine:400] Lab Results  Component Value Date   CREATININE 3.14 (H) 09/15/2020   CREATININE 3.31 (H) 09/14/2020   CREATININE 3.24 (H) 09/13/2020     Objective:  Vital signs in last 24 hours:  Temp:  [97.7 F (36.5 C)-97.9 F (36.6 C)] 97.7 F (36.5 C) (09/27 0536) Pulse Rate:  [64-75] 64 (09/27 0536) Resp:  [16-18] 18 (09/27 0536) BP: (136-175)/(70-87) 136/70 (09/27 0536) SpO2:  [98 %-100 %] 98 % (09/27 0536)  Weight change:  Filed Weights   09/12/20 0811  Weight: 82.1 kg    Intake/Output:    Intake/Output Summary (Last 24 hours) at 09/15/2020 1123 Last data filed at 09/15/2020 1001 Gross per 24 hour  Intake 1425.2 ml  Output --  Net 1425.2 ml    Physical Exam: General:  No acute distress, laying in the bed  HEENT  anicteric, moist oral mucous membrane  Pulm/lungs  normal breathing effort, coarse breath sounds  CVS/Heart  regular rhythm, no rub or gallop  Abdomen:   Soft, nontender  Extremities: + peripheral edema  Neurologic:  Alert, oriented, able to follow commands  Skin:  No acute rashes     Basic Metabolic Panel:  Recent Labs  Lab 09/12/20 0821 09/12/20 0821 09/13/20 0400 09/14/20 0913 09/15/20 0403  NA 135  --  138 137 138  K 3.3*  --  2.6* 3.6 3.4*  CL 100  --  105 107 108  CO2 24  --  25 20* 20*  GLUCOSE 290*  --  244* 274* 228*  BUN 24*  --  27* 30* 33*  CREATININE 3.52*  --  3.24* 3.31* 3.14*  CALCIUM 8.3*   < > 7.7* 8.1* 7.8*   < > = values in this interval not displayed.     CBC: Recent Labs  Lab 09/11/20 0933 09/12/20 0821 09/13/20 0400 09/14/20 0913 09/15/20 0403  WBC 9.9 11.2* 8.8 8.9 8.7  NEUTROABS  --  8.5*  --   --   --   HGB 13.1 11.3* 10.4* 11.7* 10.3*  HCT 36.6 32.3* 30.9* 35.3* 28.9*  MCV  83.4 85.0 87.5 89.1 85.8  PLT 387 342 310 390 335      Lab Results  Component Value Date   HEPBSAG NON REACTIVE 03/02/2020   HEPBSAB Reactive (A) 03/02/2020   HEPBIGM NON REACTIVE 03/02/2020      Microbiology:  Recent Results (from the past 240 hour(s))  Respiratory Panel by RT PCR (Flu A&B, Covid) - Nasopharyngeal Swab     Status: None   Collection Time: 09/12/20  4:04 PM   Specimen: Nasopharyngeal Swab  Result Value Ref Range Status   SARS Coronavirus 2 by RT PCR NEGATIVE NEGATIVE Final    Comment: (NOTE) SARS-CoV-2 target nucleic acids are NOT DETECTED.  The SARS-CoV-2 RNA is generally detectable in upper respiratoy specimens during the acute phase of infection. The lowest concentration of SARS-CoV-2 viral copies this assay can detect is 131 copies/mL. A negative result does not preclude SARS-Cov-2 infection and should not be used as the sole basis for treatment or other patient management decisions. A negative result may occur with  improper specimen collection/handling, submission of specimen other than nasopharyngeal swab, presence of viral mutation(s) within the areas targeted by this assay, and inadequate number  of viral copies (<131 copies/mL). A negative result must be combined with clinical observations, patient history, and epidemiological information. The expected result is Negative.  Fact Sheet for Patients:  PinkCheek.be  Fact Sheet for Healthcare Providers:  GravelBags.it  This test is no t yet approved or cleared by the [place] FDA and  has been authorized for detection and/or diagnosis of SARS-CoV-2 by FDA under an Emergency Use Authorization (EUA). This EUA will remain  in effect (meaning this test can be used) for the duration of the COVID-19 declaration under Section 564(b)(1) of the Act, 21 U.S.C. section 360bbb-3(b)(1), unless the authorization is terminated or revoked sooner.      Influenza A by PCR NEGATIVE NEGATIVE Final   Influenza B by PCR NEGATIVE NEGATIVE Final    Comment: (NOTE) The Xpert Xpress SARS-CoV-2/FLU/RSV assay is intended as an aid in  the diagnosis of influenza from Nasopharyngeal swab specimens and  should not be used as a sole basis for treatment. Nasal washings and  aspirates are unacceptable for Xpert Xpress SARS-CoV-2/FLU/RSV  testing.  Fact Sheet for Patients: PinkCheek.be  Fact Sheet for Healthcare Providers: GravelBags.it  This test is not yet approved or cleared by the [place] FDA and  has been authorized for detection and/or diagnosis of SARS-CoV-2 by  FDA under an Emergency Use Authorization (EUA). This EUA will remain  in effect (meaning this test can be used) for the duration of the  Covid-19 declaration under Section 564(b)(1) of the Act, 21  U.S.C. section 360bbb-3(b)(1), unless the authorization is  terminated or revoked. Performed at [facility], [address], [place], [place] [address]   Urine culture     Status: Abnormal   Collection Time: 09/12/20  4:04 PM   Specimen: Urine, Random  Result Value Ref Range Status   Specimen Description   Final    URINE, RANDOM Performed at [facility], [address], [place], [place] [address]    Special Requests   Final    NONE Performed at [facility], [address] [place], [place] [address]    Culture MULTIPLE SPECIES PRESENT, SUGGEST RECOLLECTION (A)  Final   Report Status 09/14/2020 FINAL  Final    Coagulation Studies: No results for input(s): LABPROT, INR in the last 72 hours.  Urinalysis: No results for input(s): COLORURINE, LABSPEC, PHURINE, GLUCOSEU, HGBUR, BILIRUBINUR, KETONESUR, PROTEINUR, UROBILINOGEN, NITRITE, LEUKOCYTESUR in the last 72 hours.  Invalid input(s): APPERANCEUR    Imaging: No results found.   Medications:    . amLODipine  10 mg Oral  Daily  . atorvastatin  80 mg Oral Daily  . carvedilol  12.5 mg Oral BID WC  . fluticasone furoate-vilanterol  1 puff Inhalation Daily  . insulin aspart  0-5 Units Subcutaneous QHS  . insulin aspart  0-9 Units Subcutaneous TID WC  . isosorbide mononitrate  30 mg Oral Daily  . mirtazapine  30 mg Oral QHS  . pantoprazole  40 mg Oral Daily  . topiramate  25 mg Oral BID  . valACYclovir  500 mg Oral BID   acetaminophen **OR** acetaminophen, bisacodyl, ipratropium-albuterol, labetalol, morphine injection, ondansetron **OR** ondansetron (ZOFRAN) IV, oxyCODONE  Assessment/ Plan:  59 y.o. female with    coronary artery disease s/p CABG in 2018, CVA, CKD, hypertension, hyperlipidemia, diabetes mellitus type 2, degenerative tumor s/p removal in 1992, blindness in the left eye   admitted on 09/12/2020 for Kidney hematoma [S37.019A] Gross hematuria [R31.0] Hematuria [R31.9]  #Acute kidney injury #Chronic kidney  disease stage IV.  Baseline creatinine 3.1/GFR 16-18 -AKI possibly secondary to ATN from volume shifts.  CT of the abdomen showed stable bilateral hydronephrosis and prominent extrarenal pelvis.  Patient was evaluated by urologist who recommended outpatient Lasix scan.  #Hypertension with lower extremity edema -Continue to follow low-salt diet -Continue current antihypertensive regimen and adjust as outpatient -Continue furosemide as outpatient  #Hypokalemia -Expected to resolve with normal diet -If continues as outpatient, may need potassium supplementation  #Anemia of chronic kidney disease Lab Results  Component Value Date   HGB 10.3 (L) 09/15/2020  No indication for Epogen at this time  #Diabetes type 2 with CKD Lab Results  Component Value Date   HGBA1C 10.2 (H) 02/27/2020  Diabetes is poorly controlled Work with PCP to optimize blood sugar control     LOS: 2 Dakota Stangl 9/27/202111:23 AM  [facility], [place]  Note:  This note was prepared with Dragon dictation. Any transcription errors are unintentional

## 2020-09-15 NOTE — Progress Notes (Signed)
Patient sent out via wheelchair to her fathers waiting car

## 2020-09-15 NOTE — Progress Notes (Signed)
IV site leaking. Patient may potentially be discharged today. Dr Jimmye Norman notified and said to leave IV out, discontinue IVF

## 2020-09-15 NOTE — Evaluation (Signed)
Clinical/Bedside Swallow Evaluation Patient Details  Name: Terry Arroyo MRN: [id number] Date of Birth: 1961-06-18  Today's Date: 09/15/2020 Time: SLP Start Time (ACUTE ONLY): 0955 SLP Stop Time (ACUTE ONLY): 1055 SLP Time Calculation (min) (ACUTE ONLY): 60 min  Past Medical History:  Past Medical History:  Diagnosis Date  . Blind left eye   . Brain tumor ([place]) 1986  . CHF (congestive heart failure) ([place])   . Diabetes mellitus without complication ([place])   . Hyperlipidemia   . Hypertension   . MI (myocardial infarction) ([place]) 2018   Past Surgical History:  Past Surgical History:  Procedure Laterality Date  . BRAIN SURGERY    . BREAST EXCISIONAL BIOPSY Left yrs ago   benign  . CESAREAN SECTION     HPI:  Pt is a 59 y/o F w/ PMH of CAD s/p CABG in 2018, CVA (in 2014), CKD, asthma, HTN, HLD, DM2, pituitary tumor s/p removal (in 1992, benign), blindness of left eye who presents w/ hematuria and right flank pain x day prior to admission. The flank pain is on the right side that is sharp, intermittent w/ radiation to the lower abd. Laying on her stomach makes the pain better and laying on her right side makes the pain worse. The severity is currently 6/10. Of note, pt had a kidney biopsy the day prior to admission for worsening Cr/GFR of unknown etiology. Also, pt c/o chills, nausea and vomiting.    Assessment / Plan / Recommendation Clinical Impression  Pt appears to present w/ adequate oropharyngeal phase swallowing w/ No oropharyngeal phase dysphagia noted, No neuromuscular deficits noted.Pt consumed po trials w/ No overt, clinical s/s of aspiration during/after po trials. Pt appears at reduced risk for aspiration when following general aspiration precautions. However, pt did exhibit mild s/s of potential REFLUX behavior and described several s/s of REFLUX issues including spitting up Phlegm, hiccups/belching, and feelings of pressure/tightness in the chest when eating. She also reported  incidents of Regurgitation of food/liquid material at home. Pt stated she has not been treated for Reflux; is not on a PPI currently. During po trials, pt consumed all consistencies w/ no overt coughing, decline in vocal quality, or change in respiratory presentation during/post trials. Oral phase appeared [facility] w/ timely bolus management, mastication, and control of bolus propulsion for A-P transfer for swallowing. Oral clearing achieved w/ all trial consistencies. Cursory OM Exam appeared [facility] w/ no unilateral weakness noted. Speech Clear. Pt fed self. Recommend continue a Regular consistency diet w/ well-Cut meats, moistened foods; Thin liquids. Recommend general aspiration precautions, REFLUX precautions. Education given on REFLUX management; food consistencies and easy to eat options; general aspiration precautions. NSG to reconsult if any new needs arise. NSG agreed. MD consulted and will address REFLUX; PPI recommended. Pt would benefit from f/u w/ GI for formal assessment d/t REFLUX s/s; pt stated current Stress in her life.  SLP Visit Diagnosis: Dysphagia, unspecified (R13.10) (suspect impact from Reflux d/t s/s)    Aspiration Risk  No limitations    Diet Recommendation  Regular diet w/ well-cut meats, moistened foods(lessen problematic foods causing Esophageal dysmotility or s/s of REFLUX); Thin liquids. General aspiration and REFLUX precautions. Handouts given.  Medication Administration: Whole meds with liquid    Other  Recommendations Recommended Consults: Consider GI evaluation (REFLUX) Oral Care Recommendations: Oral care BID;Patient independent with oral care Other Recommendations:  (n./a)   Follow up Recommendations None      Frequency and Duration  (n/a)   (n/a)  Prognosis Prognosis for Safe Diet Advancement: Good Barriers to Reach Goals:  (REFLUX )      Swallow Study   General Date of Onset: 09/11/20 HPI: Pt is a 59 y/o F w/ PMH of CAD s/p CABG in 2018, CVA (in  2014), CKD, asthma, HTN, HLD, DM2, pituitary tumor s/p removal (in 1992, benign), blindness of left eye who presents w/ hematuria and right flank pain x day prior to admission. The flank pain is on the right side that is sharp, intermittent w/ radiation to the lower abd. Laying on her stomach makes the pain better and laying on her right side makes the pain worse. The severity is currently 6/10. Of note, pt had a kidney biopsy the day prior to admission for worsening Cr/GFR of unknown etiology. Also, pt c/o chills, nausea and vomiting.  Type of Study: Bedside Swallow Evaluation Previous Swallow Assessment: none Diet Prior to this Study: Regular;Thin liquids Temperature Spikes Noted: No (wbc 8.7) Respiratory Status: Room air History of Recent Intubation: No Behavior/Cognition: Alert;Cooperative;Pleasant mood Oral Cavity Assessment: Within Functional Limits Oral Care Completed by SLP: Recent completion by staff Oral Cavity - Dentition: Adequate natural dentition Vision: Functional for self-feeding (blind in L eye) Self-Feeding Abilities: Able to feed self Patient Positioning: Upright in bed (EOB) Baseline Vocal Quality: Normal (min gravely) Volitional Cough: Strong Volitional Swallow: Able to elicit    Oral/Motor/Sensory Function Overall Oral Motor/Sensory Function: Within functional limits   Ice Chips Ice chips: Not tested   Thin Liquid Thin Liquid: Within functional limits Presentation: Self Fed;Straw (drinking water via Jug/straw; multiple sips)    Nectar Thick Nectar Thick Liquid: Not tested   Honey Thick Honey Thick Liquid: Not tested   Puree Puree: Not tested   Solid     Solid: Within functional limits Presentation: Self Fed;Spoon (several bitesof breakfast meal) Other Comments: no reports of discomfort this meal        Orinda Kenner, MS, [employer] Speech Language Pathologist Re[facility] [phone] Terry Arroyo 09/15/2020,1:50 PM

## 2020-09-16 ENCOUNTER — Telehealth (HOSPITAL_COMMUNITY): Payer: Self-pay

## 2020-09-16 DIAGNOSIS — R809 Proteinuria, unspecified: Secondary | ICD-10-CM | POA: Diagnosis not present

## 2020-09-16 DIAGNOSIS — E1129 Type 2 diabetes mellitus with other diabetic kidney complication: Secondary | ICD-10-CM | POA: Diagnosis not present

## 2020-09-16 DIAGNOSIS — N189 Chronic kidney disease, unspecified: Secondary | ICD-10-CM | POA: Diagnosis not present

## 2020-09-16 NOTE — Telephone Encounter (Signed)
Today had a telephone visit with Terry Arroyo.  She states feeling better, she is sore from the biopsy and she is still weak.  She has a new apartment and will be moving in probably next week.  She is looking forward to having her own place.  She will have close neighbors.  She is working on getting an aid to help her out.  She has been living with her Dad for several years due to her health.  She has everything she needs for her apartment.  She has all her medications and aware of how to take them.  Her eyes are healing, she did lose vision in 1 eye but other is able to see better in it.  She has no swelling, she tries to stay active and walk.  Her weight is staying same at around 180 lbs.  She watches her high sodium foods and fluids daily. She is aware of up coming appts.  Will do a home visit once she has moved in.  Will visit for heart failure, diet and medication compliance.   [place] [phone]

## 2020-09-18 ENCOUNTER — Other Ambulatory Visit: Payer: Self-pay

## 2020-09-18 ENCOUNTER — Encounter: Payer: Self-pay | Admitting: Nephrology

## 2020-09-18 LAB — SURGICAL PATHOLOGY

## 2020-09-18 NOTE — Patient Outreach (Signed)
[facility]) Care Management  09/18/2020  Terry Arroyo May 11, 1961 [id number]    EMMI-General Discharge RED ON EMMI ALERT Day # 1 Date: 09/17/2020 Red Alert Reason: "scheduled follow up? No Unfilled prescriptions? Yes Other questions/problems? Yes"   Outreach attempt # 1 to patient. Spoke with patient briefly as she rep rotes she ws still resting.  Reviewed and addressed red alerts with patient. She confirms that she called PCP office and scheduled MD follow up appt for next Monday. However, her main MD/PCP is booked up so she will be seeing one of the PAs. She denies any issues with transportation. She confirms she has all her meds in the home and no issues or concerns regarding them. Patient reports she has no questions or concerns at this time. She is aware to seek medical attention for any worsening and/or unresolved sxs. Advised patient that they would get one more automated EMMI-GENERAL post discharge calls to assess how they are doing following recent hospitalization and will receive a call from a nurse if any of their responses were abnormal. Patient voiced understanding and was appreciative of f/u call.    Plan: RN CM will close case at this time.   [name], RN,BSN,CCM [facility] Telephonic Care Management Coordinator Direct Phone: [phone] Toll Free: [phone] Fax: [phone]

## 2020-09-22 ENCOUNTER — Other Ambulatory Visit: Payer: Self-pay

## 2020-09-22 DIAGNOSIS — K219 Gastro-esophageal reflux disease without esophagitis: Secondary | ICD-10-CM | POA: Diagnosis not present

## 2020-09-22 DIAGNOSIS — N184 Chronic kidney disease, stage 4 (severe): Secondary | ICD-10-CM | POA: Diagnosis not present

## 2020-09-22 DIAGNOSIS — G4733 Obstructive sleep apnea (adult) (pediatric): Secondary | ICD-10-CM | POA: Diagnosis not present

## 2020-09-22 DIAGNOSIS — Z09 Encounter for follow-up examination after completed treatment for conditions other than malignant neoplasm: Secondary | ICD-10-CM | POA: Diagnosis not present

## 2020-09-22 DIAGNOSIS — I129 Hypertensive chronic kidney disease with stage 1 through stage 4 chronic kidney disease, or unspecified chronic kidney disease: Secondary | ICD-10-CM | POA: Diagnosis not present

## 2020-09-22 DIAGNOSIS — Z794 Long term (current) use of insulin: Secondary | ICD-10-CM | POA: Diagnosis not present

## 2020-09-22 DIAGNOSIS — E1122 Type 2 diabetes mellitus with diabetic chronic kidney disease: Secondary | ICD-10-CM | POA: Diagnosis not present

## 2020-09-22 NOTE — Patient Outreach (Signed)
[facility]) Care Management  09/22/2020  Terry Arroyo 02/13/1961 [id number]    EMMI-General Discharge RED ON EMMI ALERT Day # 4 Date: 09/20/2020 Red Alert Reason: "Other questions/problems? Yes"   Outreach attempt # 1 to patient. Spoke with patient who reported he was doing fine. RN CM reviewed and addressed ed alert with patient. She reports error in response. She stets that other than being tired she is doing okay and has no issues or concerns at present. She is getting dressed as she has MD follow up appt today. She denies any RN CM needs or concerns at this time. Patient has completed post discharge automated EMMI calls at this time.      Plan: RN CM will close case at this time.   [name], RN,BSN,CCM [place] [facility] Telephonic Care Management Coordinator Direct Phone: [phone] Toll Free: [phone] Fax: [phone]

## 2020-10-10 ENCOUNTER — Inpatient Hospital Stay
Admission: EM | Admit: 2020-10-10 | Discharge: 2020-10-12 | DRG: 190 | Disposition: A | Discharge status: Home / Self Care | Payer: Medicare HMO | Attending: Internal Medicine | Admitting: Internal Medicine

## 2020-10-10 ENCOUNTER — Other Ambulatory Visit: Payer: Self-pay

## 2020-10-10 ENCOUNTER — Emergency Department: Payer: Medicare HMO

## 2020-10-10 DIAGNOSIS — Z79899 Other long term (current) drug therapy: Secondary | ICD-10-CM

## 2020-10-10 DIAGNOSIS — Z803 Family history of malignant neoplasm of breast: Secondary | ICD-10-CM | POA: Diagnosis not present

## 2020-10-10 DIAGNOSIS — I5022 Chronic systolic (congestive) heart failure: Secondary | ICD-10-CM | POA: Diagnosis not present

## 2020-10-10 DIAGNOSIS — Z91013 Allergy to seafood: Secondary | ICD-10-CM

## 2020-10-10 DIAGNOSIS — Z7951 Long term (current) use of inhaled steroids: Secondary | ICD-10-CM

## 2020-10-10 DIAGNOSIS — Z87891 Personal history of nicotine dependence: Secondary | ICD-10-CM | POA: Diagnosis not present

## 2020-10-10 DIAGNOSIS — Z91018 Allergy to other foods: Secondary | ICD-10-CM

## 2020-10-10 DIAGNOSIS — I2510 Atherosclerotic heart disease of native coronary artery without angina pectoris: Secondary | ICD-10-CM | POA: Diagnosis present

## 2020-10-10 DIAGNOSIS — E872 Acidosis: Secondary | ICD-10-CM | POA: Diagnosis not present

## 2020-10-10 DIAGNOSIS — N1832 Chronic kidney disease, stage 3b: Secondary | ICD-10-CM | POA: Diagnosis not present

## 2020-10-10 DIAGNOSIS — J90 Pleural effusion, not elsewhere classified: Secondary | ICD-10-CM | POA: Diagnosis not present

## 2020-10-10 DIAGNOSIS — E1159 Type 2 diabetes mellitus with other circulatory complications: Secondary | ICD-10-CM

## 2020-10-10 DIAGNOSIS — Z888 Allergy status to other drugs, medicaments and biological substances status: Secondary | ICD-10-CM | POA: Diagnosis not present

## 2020-10-10 DIAGNOSIS — I130 Hypertensive heart and chronic kidney disease with heart failure and stage 1 through stage 4 chronic kidney disease, or unspecified chronic kidney disease: Secondary | ICD-10-CM | POA: Diagnosis not present

## 2020-10-10 DIAGNOSIS — I252 Old myocardial infarction: Secondary | ICD-10-CM | POA: Diagnosis not present

## 2020-10-10 DIAGNOSIS — H5462 Unqualified visual loss, left eye, normal vision right eye: Secondary | ICD-10-CM | POA: Diagnosis present

## 2020-10-10 DIAGNOSIS — J440 Chronic obstructive pulmonary disease with acute lower respiratory infection: Secondary | ICD-10-CM | POA: Diagnosis present

## 2020-10-10 DIAGNOSIS — I160 Hypertensive urgency: Secondary | ICD-10-CM | POA: Diagnosis not present

## 2020-10-10 DIAGNOSIS — D649 Anemia, unspecified: Secondary | ICD-10-CM | POA: Diagnosis present

## 2020-10-10 DIAGNOSIS — R0602 Shortness of breath: Secondary | ICD-10-CM | POA: Diagnosis not present

## 2020-10-10 DIAGNOSIS — K219 Gastro-esophageal reflux disease without esophagitis: Secondary | ICD-10-CM | POA: Diagnosis present

## 2020-10-10 DIAGNOSIS — H409 Unspecified glaucoma: Secondary | ICD-10-CM | POA: Diagnosis present

## 2020-10-10 DIAGNOSIS — J441 Chronic obstructive pulmonary disease with (acute) exacerbation: Principal | ICD-10-CM | POA: Diagnosis present

## 2020-10-10 DIAGNOSIS — R0603 Acute respiratory distress: Secondary | ICD-10-CM

## 2020-10-10 DIAGNOSIS — Z8249 Family history of ischemic heart disease and other diseases of the circulatory system: Secondary | ICD-10-CM

## 2020-10-10 DIAGNOSIS — R0600 Dyspnea, unspecified: Secondary | ICD-10-CM | POA: Diagnosis not present

## 2020-10-10 DIAGNOSIS — Z794 Long term (current) use of insulin: Secondary | ICD-10-CM

## 2020-10-10 DIAGNOSIS — J189 Pneumonia, unspecified organism: Secondary | ICD-10-CM | POA: Diagnosis not present

## 2020-10-10 DIAGNOSIS — J9601 Acute respiratory failure with hypoxia: Secondary | ICD-10-CM | POA: Diagnosis not present

## 2020-10-10 DIAGNOSIS — E785 Hyperlipidemia, unspecified: Secondary | ICD-10-CM | POA: Diagnosis present

## 2020-10-10 DIAGNOSIS — E1122 Type 2 diabetes mellitus with diabetic chronic kidney disease: Secondary | ICD-10-CM | POA: Diagnosis present

## 2020-10-10 DIAGNOSIS — I10 Essential (primary) hypertension: Secondary | ICD-10-CM

## 2020-10-10 DIAGNOSIS — N136 Pyonephrosis: Secondary | ICD-10-CM | POA: Diagnosis not present

## 2020-10-10 DIAGNOSIS — J9602 Acute respiratory failure with hypercapnia: Secondary | ICD-10-CM

## 2020-10-10 DIAGNOSIS — Z20822 Contact with and (suspected) exposure to covid-19: Secondary | ICD-10-CM | POA: Diagnosis not present

## 2020-10-10 DIAGNOSIS — I152 Hypertension secondary to endocrine disorders: Secondary | ICD-10-CM

## 2020-10-10 DIAGNOSIS — F32A Depression, unspecified: Secondary | ICD-10-CM | POA: Diagnosis present

## 2020-10-10 DIAGNOSIS — I5020 Unspecified systolic (congestive) heart failure: Secondary | ICD-10-CM | POA: Diagnosis not present

## 2020-10-10 HISTORY — DX: Chronic obstructive pulmonary disease with (acute) exacerbation: J44.1

## 2020-10-10 LAB — TROPONIN I (HIGH SENSITIVITY)
Troponin I (High Sensitivity): 19 ng/L — ABNORMAL HIGH (ref <18)
Troponin I (High Sensitivity): 21 ng/L — ABNORMAL HIGH (ref <18)

## 2020-10-10 LAB — CBC
HCT: 29.7 % — ABNORMAL LOW (ref 36.0–46.0)
Hemoglobin: 9.6 g/dL — ABNORMAL LOW (ref 12.0–15.0)
MCH: 29.4 pg (ref 26.0–34.0)
MCHC: 32.3 g/dL (ref 30.0–36.0)
MCV: 91.1 fL (ref 80.0–100.0)
Platelets: 324 10*3/uL (ref 150–400)
RBC: 3.26 MIL/uL — ABNORMAL LOW (ref 3.87–5.11)
RDW: 15.5 % (ref 11.5–15.5)
WBC: 11.8 10*3/uL — ABNORMAL HIGH (ref 4.0–10.5)
nRBC: 0 % (ref 0.0–0.2)

## 2020-10-10 LAB — BASIC METABOLIC PANEL
Anion gap: 10 (ref 5–15)
BUN: 39 mg/dL — ABNORMAL HIGH (ref 6–20)
CO2: 21 mmol/L — ABNORMAL LOW (ref 22–32)
Calcium: 8.2 mg/dL — ABNORMAL LOW (ref 8.9–10.3)
Chloride: 112 mmol/L — ABNORMAL HIGH (ref 98–111)
Creatinine, Ser: 3.86 mg/dL — ABNORMAL HIGH (ref 0.44–1.00)
GFR, Estimated: 13 mL/min — ABNORMAL LOW (ref >60)
Glucose, Bld: 238 mg/dL — ABNORMAL HIGH (ref 70–99)
Potassium: 3.9 mmol/L (ref 3.5–5.1)
Sodium: 143 mmol/L (ref 135–145)

## 2020-10-10 LAB — BLOOD GAS, VENOUS
Patient temperature: 37
pCO2, Ven: 41 mmHg — ABNORMAL LOW (ref 44.0–60.0)
pH, Ven: 7.32 (ref 7.250–7.430)
pO2, Ven: 37 mmHg (ref 32.0–45.0)

## 2020-10-10 LAB — RESPIRATORY PANEL BY RT PCR (FLU A&B, COVID)
Influenza A by PCR: NEGATIVE
Influenza B by PCR: NEGATIVE
SARS Coronavirus 2 by RT PCR: NEGATIVE

## 2020-10-10 LAB — BRAIN NATRIURETIC PEPTIDE: B Natriuretic Peptide: 1251 pg/mL — ABNORMAL HIGH (ref 0.0–100.0)

## 2020-10-10 LAB — LACTIC ACID, PLASMA: Lactic Acid, Venous: 1.2 mmol/L (ref 0.5–1.9)

## 2020-10-10 IMAGING — CR DG CHEST 2V
1 series · 2 of 2 positions shown · non-contrast
Comparison: [DATE]

CLINICAL DATA: Dyspnea

EXAM:
CHEST - 2 VIEW

[Series 1: dg chest 2 view · 0.14mm/px · 2 of 2 slices shown]
[im 1/2]
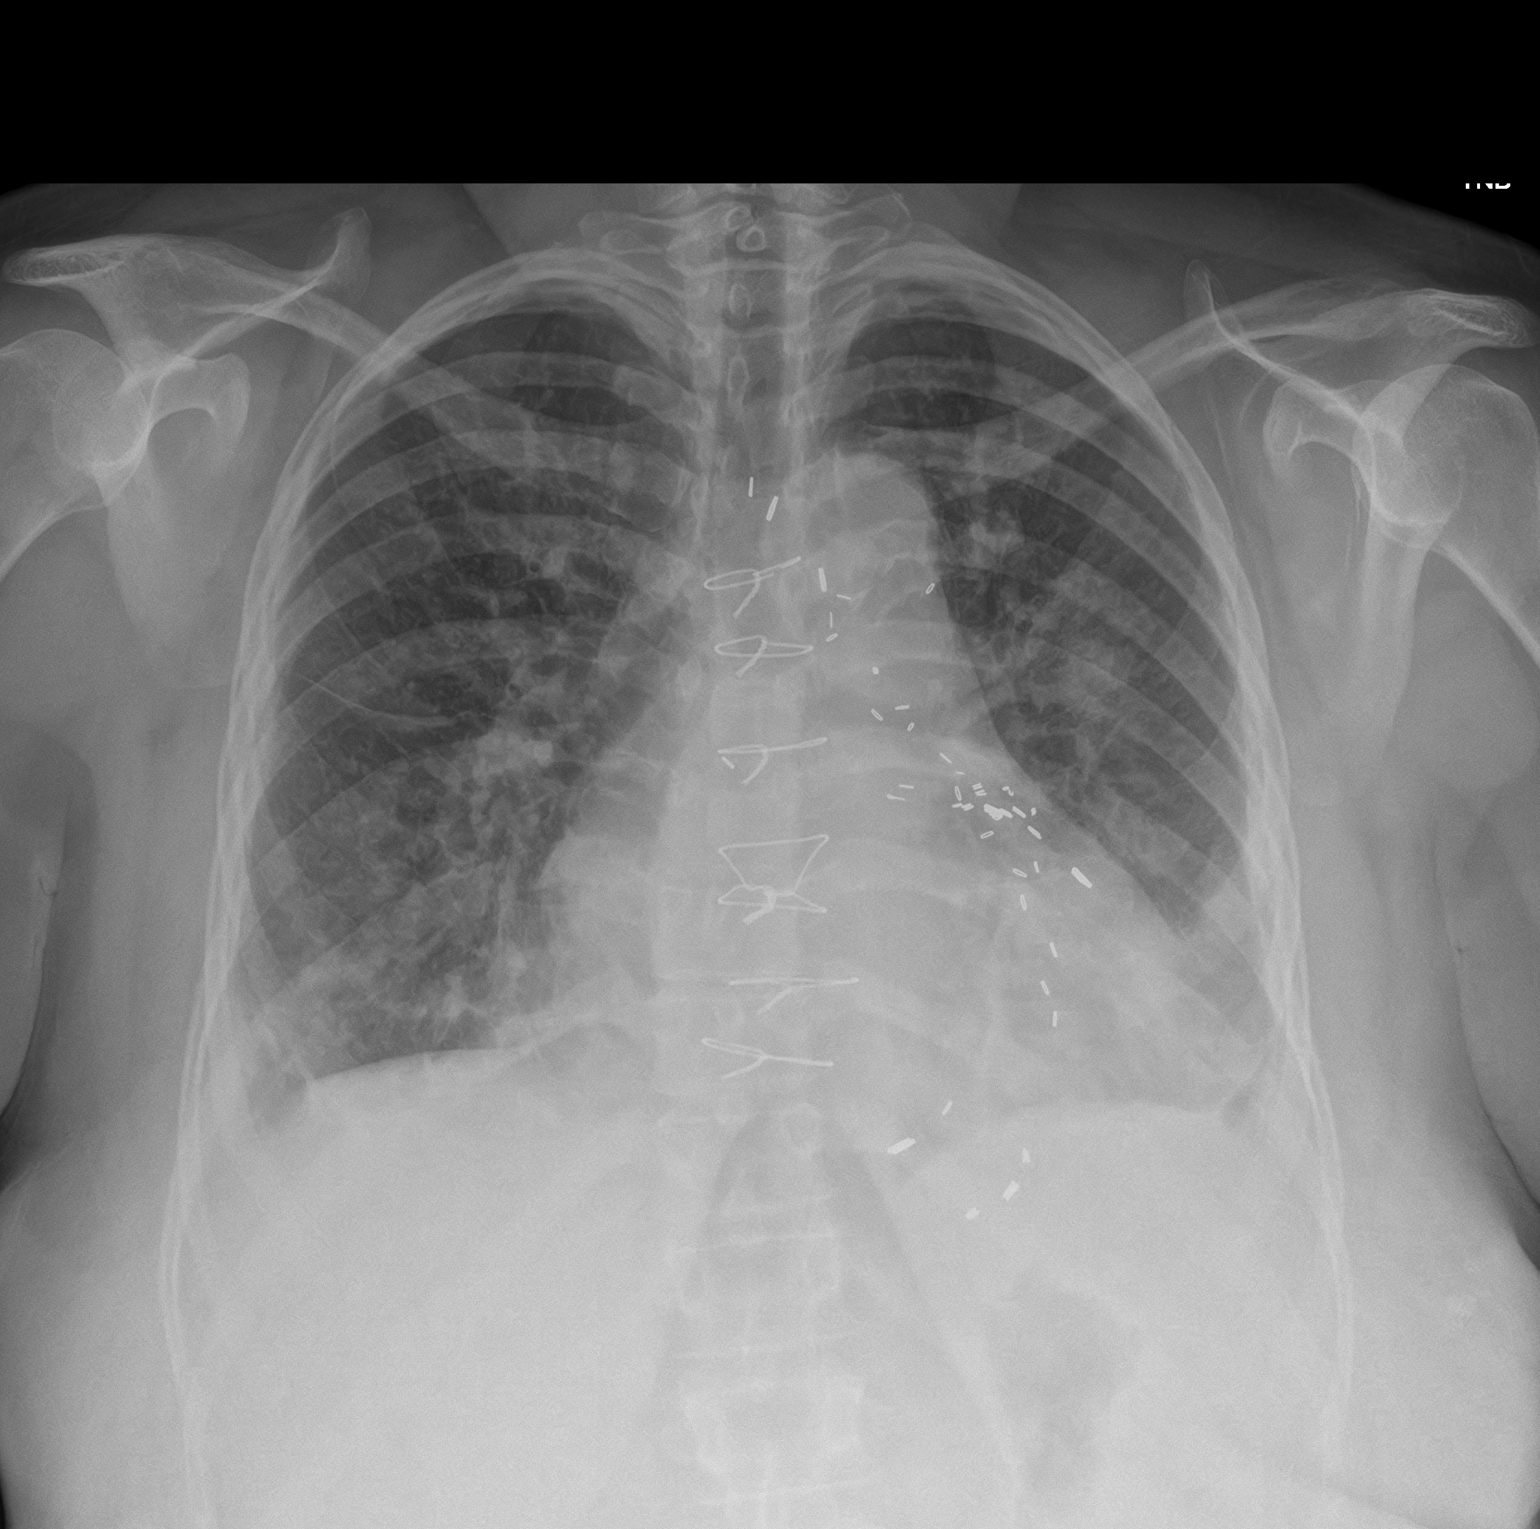
[im 2/2]
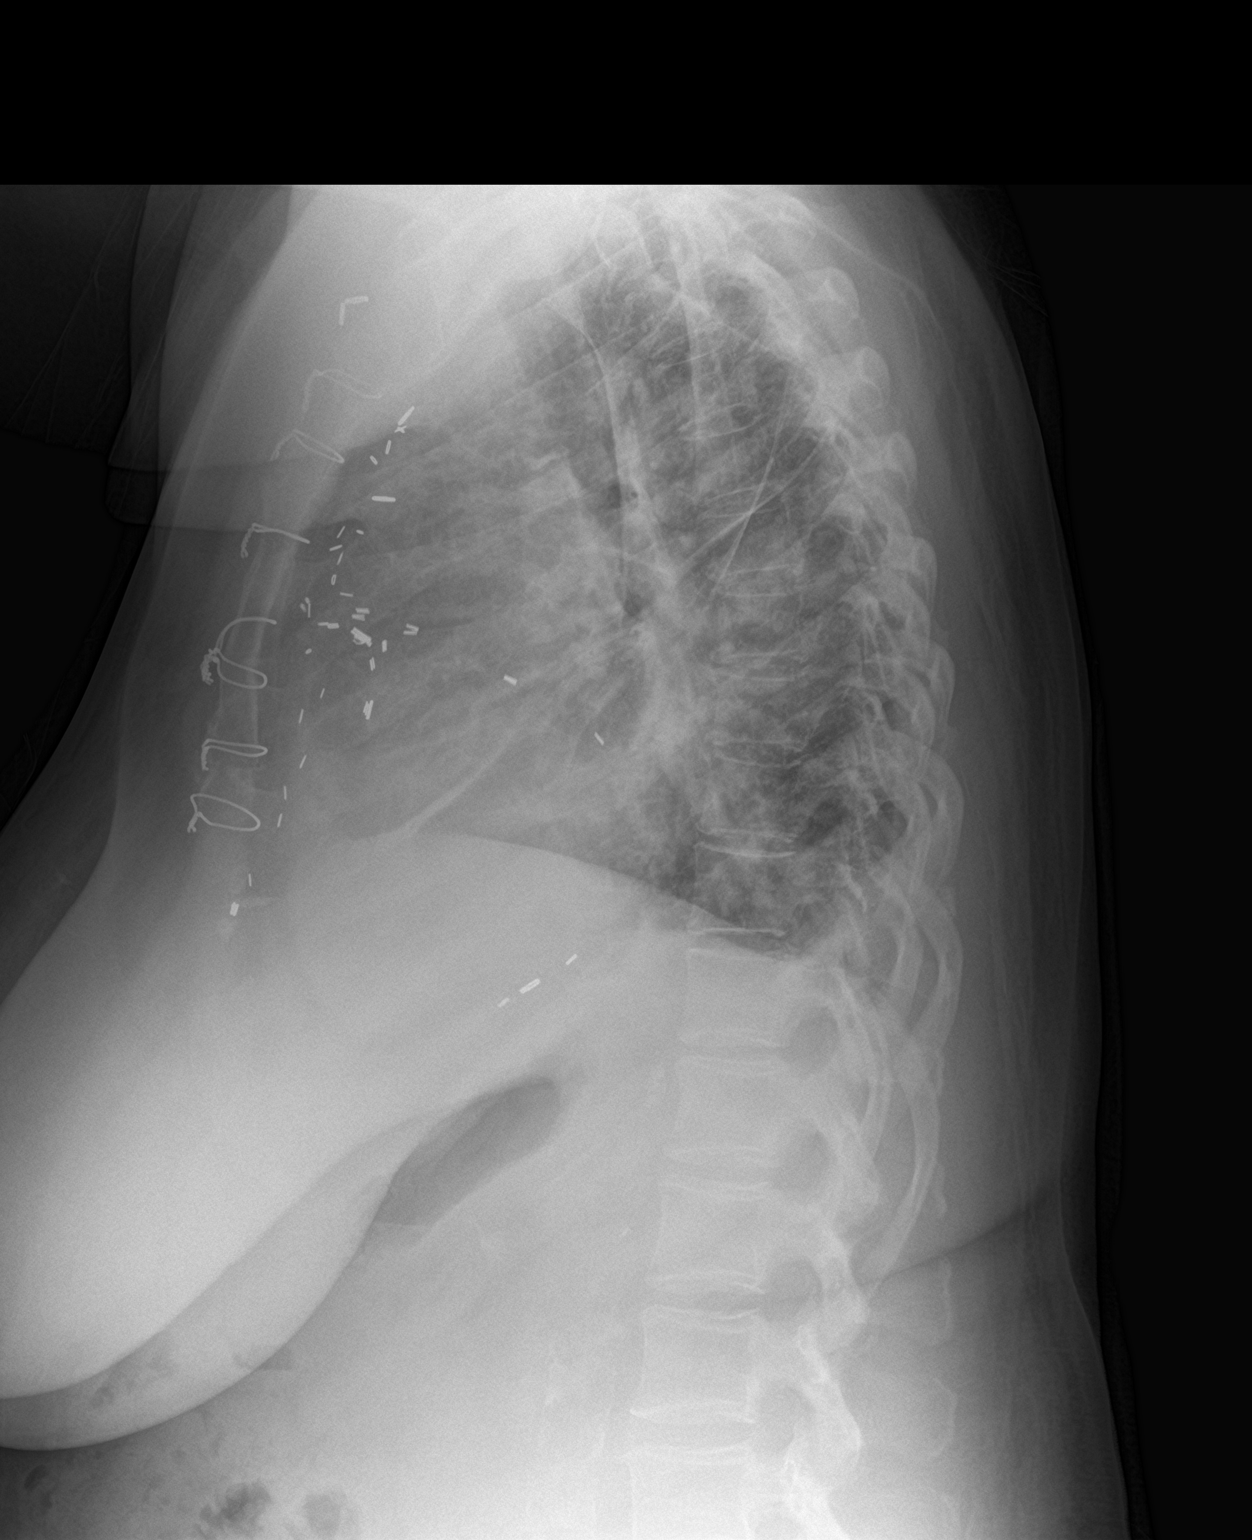

[2 of 2 positions shown; findings below may reference images not displayed]

FINDINGS: The lungs are symmetrically well expanded. Tiny right pleural
effusion has developed. Multifocal nodular infiltrate has developed
within the left lung, likely infectious or inflammatory in nature.
Minimal infiltrate may be present at the right lung base. No
pneumothorax. Coronary artery bypass grafting has been performed.
Cardiac size within normal limits. No acute bone abnormality.
IMPRESSION: Interval development of nodular asymmetric pulmonary infiltrate,
likely infectious or inflammatory.

## 2020-10-10 MED ORDER — IPRATROPIUM-ALBUTEROL 0.5-2.5 (3) MG/3ML IN SOLN
3.0000 mL | Freq: Once | RESPIRATORY_TRACT | Status: AC
  Administered 2020-10-10: 3 mL via RESPIRATORY_TRACT

## 2020-10-10 MED ORDER — PANTOPRAZOLE SODIUM 40 MG PO TBEC
40.0000 mg | DELAYED_RELEASE_TABLET | Freq: Every day | ORAL | Status: DC
  Administered 2020-10-11 – 2020-10-12 (×2): 40 mg via ORAL
  Filled 2020-10-10 (×2): qty 1

## 2020-10-10 MED ORDER — ISOSORBIDE MONONITRATE ER 30 MG PO TB24
30.0000 mg | ORAL_TABLET | Freq: Every day | ORAL | Status: DC
  Administered 2020-10-11 – 2020-10-12 (×2): 30 mg via ORAL
  Filled 2020-10-10 (×2): qty 1

## 2020-10-10 MED ORDER — HEPARIN SODIUM (PORCINE) 5000 UNIT/ML IJ SOLN
5000.0000 [IU] | Freq: Three times a day (TID) | INTRAMUSCULAR | Status: DC
  Administered 2020-10-11 – 2020-10-12 (×4): 5000 [IU] via SUBCUTANEOUS
  Filled 2020-10-10 (×4): qty 1

## 2020-10-10 MED ORDER — CARVEDILOL 3.125 MG PO TABS
12.5000 mg | ORAL_TABLET | Freq: Two times a day (BID) | ORAL | Status: DC
  Administered 2020-10-11: 12.5 mg via ORAL
  Filled 2020-10-10: qty 4
  Filled 2020-10-10: qty 2

## 2020-10-10 MED ORDER — SODIUM CHLORIDE 0.9 % IV SOLN
500.0000 mg | INTRAVENOUS | Status: DC
  Administered 2020-10-11: 20:00:00 500 mg via INTRAVENOUS
  Filled 2020-10-10 (×2): qty 500

## 2020-10-10 MED ORDER — INSULIN ASPART 100 UNIT/ML ~~LOC~~ SOLN
0.0000 [IU] | Freq: Three times a day (TID) | SUBCUTANEOUS | Status: DC
  Administered 2020-10-11: 3 [IU] via SUBCUTANEOUS
  Administered 2020-10-11: 20:00:00 1 [IU] via SUBCUTANEOUS
  Administered 2020-10-11: 5 [IU] via SUBCUTANEOUS
  Administered 2020-10-11: 7 [IU] via SUBCUTANEOUS
  Administered 2020-10-12: 3 [IU] via SUBCUTANEOUS
  Administered 2020-10-12: 14:00:00 2 [IU] via SUBCUTANEOUS
  Filled 2020-10-10 (×6): qty 1

## 2020-10-10 MED ORDER — LABETALOL HCL 5 MG/ML IV SOLN
20.0000 mg | INTRAVENOUS | Status: DC | PRN
  Administered 2020-10-11 (×2): 10 mg via INTRAVENOUS
  Filled 2020-10-10: qty 4

## 2020-10-10 MED ORDER — ATROPINE SULFATE 1 % OP SOLN: 1.0000 [drp] | Freq: Two times a day (BID) | OPHTHALMIC | Status: DC

## 2020-10-10 MED ORDER — ACETAMINOPHEN 650 MG RE SUPP: 650.0000 mg | Freq: Four times a day (QID) | RECTAL | Status: DC | PRN

## 2020-10-10 MED ORDER — METHYLPREDNISOLONE SODIUM SUCC 125 MG IJ SOLR
125.0000 mg | Freq: Once | INTRAMUSCULAR | Status: AC
  Administered 2020-10-10: 125 mg via INTRAVENOUS
  Filled 2020-10-10: qty 2

## 2020-10-10 MED ORDER — FUROSEMIDE 40 MG PO TABS: 40.0000 mg | ORAL_TABLET | Freq: Every day | ORAL | Status: DC

## 2020-10-10 MED ORDER — AMLODIPINE BESYLATE 10 MG PO TABS
10.0000 mg | ORAL_TABLET | Freq: Every day | ORAL | Status: DC
  Administered 2020-10-11 – 2020-10-12 (×2): 10 mg via ORAL
  Filled 2020-10-10: qty 2
  Filled 2020-10-10: qty 1

## 2020-10-10 MED ORDER — OFLOXACIN 0.3 % OP SOLN: 1.0000 [drp] | Freq: Four times a day (QID) | OPHTHALMIC | Status: DC

## 2020-10-10 MED ORDER — MAGNESIUM HYDROXIDE 400 MG/5ML PO SUSP
30.0000 mL | Freq: Every day | ORAL | Status: DC | PRN
  Filled 2020-10-10: qty 30

## 2020-10-10 MED ORDER — MAGNESIUM SULFATE 2 GM/50ML IV SOLN
2.0000 g | Freq: Once | INTRAVENOUS | Status: AC
  Administered 2020-10-10: 2 g via INTRAVENOUS
  Filled 2020-10-10: qty 50

## 2020-10-10 MED ORDER — SODIUM CHLORIDE 0.9 % IV SOLN
500.0000 mg | Freq: Once | INTRAVENOUS | Status: AC
  Administered 2020-10-10: 500 mg via INTRAVENOUS
  Filled 2020-10-10: qty 500

## 2020-10-10 MED ORDER — SODIUM CHLORIDE 0.9 % IV SOLN: INTRAVENOUS | Status: DC

## 2020-10-10 MED ORDER — ONDANSETRON HCL 4 MG/2ML IJ SOLN
4.0000 mg | Freq: Four times a day (QID) | INTRAMUSCULAR | Status: DC | PRN
  Administered 2020-10-11: 4 mg via INTRAVENOUS
  Filled 2020-10-10: qty 2

## 2020-10-10 MED ORDER — SODIUM CHLORIDE 0.9 % IV SOLN
2.0000 g | INTRAVENOUS | Status: DC
  Administered 2020-10-11: 2 g via INTRAVENOUS
  Filled 2020-10-10: qty 20
  Filled 2020-10-10: qty 2

## 2020-10-10 MED ORDER — LORAZEPAM 2 MG/ML IJ SOLN: 1.0000 mg | Freq: Once | INTRAMUSCULAR | Status: DC

## 2020-10-10 MED ORDER — IPRATROPIUM-ALBUTEROL 0.5-2.5 (3) MG/3ML IN SOLN
3.0000 mL | Freq: Once | RESPIRATORY_TRACT | Status: AC
  Administered 2020-10-10: 3 mL via RESPIRATORY_TRACT
  Filled 2020-10-10: qty 3

## 2020-10-10 MED ORDER — MIRTAZAPINE 15 MG PO TABS
30.0000 mg | ORAL_TABLET | Freq: Every day | ORAL | Status: DC
  Administered 2020-10-11: 20:00:00 30 mg via ORAL
  Filled 2020-10-10: qty 2

## 2020-10-10 MED ORDER — SODIUM CHLORIDE 0.9 % IV SOLN
2.0000 g | Freq: Once | INTRAVENOUS | Status: AC
  Administered 2020-10-10: 2 g via INTRAVENOUS
  Filled 2020-10-10: qty 2

## 2020-10-10 MED ORDER — ASPIRIN EC 81 MG PO TBEC
81.0000 mg | DELAYED_RELEASE_TABLET | Freq: Every day | ORAL | Status: DC
  Administered 2020-10-11 – 2020-10-12 (×2): 81 mg via ORAL
  Filled 2020-10-10 (×2): qty 1

## 2020-10-10 MED ORDER — METOPROLOL SUCCINATE ER 50 MG PO TB24
50.0000 mg | ORAL_TABLET | Freq: Every day | ORAL | Status: DC
  Administered 2020-10-11 – 2020-10-12 (×2): 50 mg via ORAL
  Filled 2020-10-10 (×2): qty 1

## 2020-10-10 MED ORDER — VALACYCLOVIR HCL 500 MG PO TABS
500.0000 mg | ORAL_TABLET | Freq: Two times a day (BID) | ORAL | Status: DC
  Administered 2020-10-11: 500 mg via ORAL
  Filled 2020-10-10: qty 1

## 2020-10-10 MED ORDER — ACETAMINOPHEN 325 MG PO TABS: 650.0000 mg | ORAL_TABLET | Freq: Four times a day (QID) | ORAL | Status: DC | PRN

## 2020-10-10 MED ORDER — BRIMONIDINE TARTRATE 0.2 % OP SOLN: 1.0000 [drp] | Freq: Three times a day (TID) | OPHTHALMIC | Status: DC

## 2020-10-10 MED ORDER — INSULIN ASPART PROT & ASPART (70-30 MIX) 100 UNIT/ML ~~LOC~~ SUSP
45.0000 [IU] | Freq: Two times a day (BID) | SUBCUTANEOUS | Status: DC
  Administered 2020-10-11 (×2): 45 [IU] via SUBCUTANEOUS
  Filled 2020-10-10 (×3): qty 10

## 2020-10-10 MED ORDER — ENOXAPARIN SODIUM 40 MG/0.4ML ~~LOC~~ SOLN: 40.0000 mg | SUBCUTANEOUS | Status: DC

## 2020-10-10 MED ORDER — PREDNISOLONE ACETATE 1 % OP SUSP: 1.0000 [drp] | Freq: Four times a day (QID) | OPHTHALMIC | Status: DC

## 2020-10-10 MED ORDER — TOPIRAMATE 25 MG PO TABS
25.0000 mg | ORAL_TABLET | Freq: Every day | ORAL | Status: DC
  Administered 2020-10-11 – 2020-10-12 (×2): 25 mg via ORAL
  Filled 2020-10-10 (×2): qty 1

## 2020-10-10 MED ORDER — ASPIRIN EC 81 MG PO TBEC
81.0000 mg | DELAYED_RELEASE_TABLET | Freq: Every day | ORAL | Status: DC
  Filled 2020-10-10: qty 1

## 2020-10-10 MED ORDER — ATORVASTATIN CALCIUM 20 MG PO TABS: 80.0000 mg | ORAL_TABLET | Freq: Every day | ORAL | Status: DC

## 2020-10-10 MED ORDER — CYCLOBENZAPRINE HCL 10 MG PO TABS: 5.0000 mg | ORAL_TABLET | Freq: Two times a day (BID) | ORAL | Status: DC | PRN

## 2020-10-10 MED ORDER — LORAZEPAM 2 MG/ML IJ SOLN
INTRAMUSCULAR | Status: AC
  Filled 2020-10-10: qty 1

## 2020-10-10 MED ORDER — TRAZODONE HCL 50 MG PO TABS: 25.0000 mg | ORAL_TABLET | Freq: Every evening | ORAL | Status: DC | PRN

## 2020-10-10 MED ORDER — ONDANSETRON HCL 4 MG PO TABS
4.0000 mg | ORAL_TABLET | Freq: Four times a day (QID) | ORAL | Status: DC | PRN
  Administered 2020-10-11: 4 mg via ORAL
  Filled 2020-10-10: qty 1

## 2020-10-10 MED ORDER — ALBUTEROL SULFATE (2.5 MG/3ML) 0.083% IN NEBU: 2.5000 mg | INHALATION_SOLUTION | Freq: Once | RESPIRATORY_TRACT | Status: DC

## 2020-10-10 MED ORDER — KETOROLAC TROMETHAMINE 0.5 % OP SOLN: 1.0000 [drp] | Freq: Four times a day (QID) | OPHTHALMIC | Status: DC

## 2020-10-10 NOTE — ED Triage Notes (Signed)
This RN notified by screener that patient requesting oxygen due to SOB, pt with noted cough, RA sats 95%, apologized and explained delay to patient and explained would get patient triaged as quickly as able.

## 2020-10-10 NOTE — Progress Notes (Signed)
Anticoagulation monitoring(Lovenox):  59 yo female ordered Lovenox 40 mg Q24h  Filed Weights   10/10/20 1131  Weight: 83.9 kg (185 lb)   BMI 29.86   Lab Results  Component Value Date   CREATININE 3.86 (H) 10/10/2020   CREATININE 3.14 (H) 09/15/2020   CREATININE 3.31 (H) 09/14/2020   Estimated Creatinine Clearance: 17.3 mL/min (A) (by C-G formula based on SCr of 3.86 mg/dL (H)). Hemoglobin & Hematocrit     Component Value Date/Time   HGB 9.6 (L) 10/10/2020 1134   HGB 12.6 11/18/2013 0138   HCT 29.7 (L) 10/10/2020 1134   HCT 37.9 11/18/2013 0138     Per Protocol for Patient with estCrcl < 30 ml/min and BMI < 40, will transition to Heparin 5000 units SQ Q8H.

## 2020-10-10 NOTE — Progress Notes (Signed)
PHARMACY -  BRIEF ANTIBIOTIC NOTE   Pharmacy has received consult(s) for Cefepime from an ED provider.  The patient's profile has been reviewed for ht/wt/allergies/indication/available labs.    One time order(s) placed for Cefepime 2g IV  Further antibiotics/pharmacy consults should be ordered by admitting physician if indicated.                       Sherilyn Banker, PharmD Pharmacy Resident  10/10/2020 7:11 PM

## 2020-10-10 NOTE — ED Provider Notes (Signed)
[facility] Emergency Department Provider Note    First MD Initiated Contact with Patient 10/10/20 1720     (approximate)  I have reviewed the triage vital signs and the nursing notes.   HISTORY  Chief Complaint Shortness of Breath and Leg Swelling  History limited 2/2 resp distress  HPI Terry Arroyo is a 59 y.o. female bolus past medical history presents to the ER for worsening shortness of breath. Patient states that she awoke with shortness of breath. Denies any pain. Can feel herself wheezing. Having difficulty  provide any history due to respiratory distress. She is brought back from triage to the hall due to mild prolonged wait times but on my evaluation patient found to be tripoding in respiratory distress and was taken to room for BiPAP.   Past Medical History:  Diagnosis Date  . Blind left eye   . Brain tumor ([place]) 1986  . CHF (congestive heart failure) ([place])   . Diabetes mellitus without complication ([place])   . Hyperlipidemia   . Hypertension   . MI (myocardial infarction) ([place]) 2018   Family History  Problem Relation Age of Onset  . CAD Mother   . CAD Brother   . Breast cancer Sister 65   Past Surgical History:  Procedure Laterality Date  . BRAIN SURGERY    . BREAST EXCISIONAL BIOPSY Left yrs ago   benign  . CESAREAN SECTION     Patient Active Problem List   Diagnosis Date Noted  . Kidney hematoma 09/13/2020  . Essential hypertension   . AKI (acute kidney injury) ([place])   . Hematuria 09/12/2020  . Calf tenderness   . Acute on chronic heart failure ([place]) 02/27/2020  . Leg edema   . DM2 (diabetes mellitus, type 2) ([place]) 02/26/2020  . CKD (chronic kidney disease), stage III ([place]) 02/26/2020  . Hyponatremia 02/26/2020  . Anasarca 02/26/2020  . Proteinuria 02/26/2020  . Hypoalbuminemia 02/26/2020  . Elevated troponin 02/26/2020  . Acute on chronic systolic CHF (congestive heart failure) ([place]) 02/26/2020  . Hyperglycemia   .  Nausea   . Hypertensive emergency   . Hypertensive urgency 11/24/2019  . Nonspecific chest pain 11/23/2019      Prior to Admission medications   Medication Sig Start Date End Date Taking? Authorizing Provider  amLODipine (NORVASC) 10 MG tablet Take 1 tablet (10 mg total) by mouth daily. 03/03/20   Lorella Nimrod, MD  aspirin 81 MG EC tablet Take 1 tablet (81 mg total) by mouth daily. Do not take this medication for 1 week and then restart. 09/15/20   Wyvonnia Dusky, MD  atorvastatin (LIPITOR) 80 MG tablet Take 1 tablet (80 mg total) by mouth daily at 6 PM. 11/26/19   Lavina Hamman, MD  atropine 1 % ophthalmic solution Place 1 drop into the right eye 2 (two) times daily. 07/21/20   [provider]  brimonidine (ALPHAGAN) 0.2 % ophthalmic solution Place 1 drop into the right eye 3 (three) times daily. 08/22/20   [provider]  carvedilol (COREG) 12.5 MG tablet Take 12.5 mg by mouth 2 (two) times daily with a meal.    [provider]  cyclobenzaprine (FLEXERIL) 5 MG tablet Take 5 mg by mouth 2 (two) times daily as needed for muscle spasms. 09/01/20   [provider]  Fluticasone-Salmeterol (ADVAIR) 250-50 MCG/DOSE AEPB Inhale 1 puff into the lungs 2 (two) times daily.    [provider]  furosemide (LASIX) 40 MG tablet  Take 1 tablet (40 mg total) by mouth daily. 03/02/20   Lorella Nimrod, MD  insulin aspart protamine- aspart (NOVOLOG MIX 70/30) (70-30) 100 UNIT/ML injection Inject 0.4 mLs (40 Units total) into the skin 2 (two) times daily with a meal. Patient taking differently: Inject 45 Units into the skin 2 (two) times daily with a meal.  11/26/19   Lavina Hamman, MD  isosorbide mononitrate (IMDUR) 30 MG 24 hr tablet Take 30 mg by mouth daily.    [provider]  ketorolac (ACULAR) 0.5 % ophthalmic solution Place 1 drop into the right eye 4 (four) times daily. 08/22/20   [provider]  metoprolol succinate (TOPROL-XL) 50 MG 24 hr  tablet Take 50 mg by mouth daily. 05/06/20   [provider]  mirtazapine (REMERON) 30 MG tablet Take 1 tablet (30 mg total) by mouth at bedtime. 11/26/19   Lavina Hamman, MD  mometasone-formoterol [facility]) 200-5 MCG/ACT AERO Inhale 2 puffs into the lungs 2 (two) times daily.    [provider]  ofloxacin (OCUFLOX) 0.3 % ophthalmic solution Place 1 drop into the right eye 4 (four) times daily. 08/22/20   [provider]  pantoprazole (PROTONIX) 40 MG tablet Take 1 tablet (40 mg total) by mouth daily. 09/16/20 10/16/20  Wyvonnia Dusky, MD  prednisoLONE acetate (PRED FORTE) 1 % ophthalmic suspension Place 1 drop into the right eye 4 (four) times daily. 08/22/20   [provider]  topiramate (TOPAMAX) 25 MG capsule Take 25 mg by mouth daily.    [provider]  valACYclovir (VALTREX) 500 MG tablet Take 1 tablet by mouth 2 (two) times daily. 05/20/17   [provider]    Allergies Hydralazine, Lisinopril, Shellfish allergy, Kiwi extract, Metformin, and Tape    Social History Social History   Tobacco Use  . Smoking status: Former Research scientist (life sciences)  . Smokeless tobacco: Never Used  Substance Use Topics  . Alcohol use: Not Currently  . Drug use: Not Currently    Review of Systems Patient denies headaches, rhinorrhea, blurry vision, numbness, shortness of breath, chest pain, edema, cough, abdominal pain, nausea, vomiting, diarrhea, dysuria, fevers, rashes or hallucinations unless otherwise stated above in HPI. ____________________________________________   PHYSICAL EXAM:  VITAL SIGNS: Vitals:   10/10/20 1507 10/10/20 1729  BP: (!) 192/82 (!) 177/92  Pulse: 70 74  Resp: (!) 22 20  Temp:    SpO2: 94% 95%    Constitutional: Alert and oriented. Tripoding, speaking in one word responses Eyes: Conjunctivae are normal.  Head: Atraumatic. Nose: No congestion/rhinnorhea. Mouth/Throat: Mucous membranes are moist.   Neck: No stridor. Painless ROM.   Cardiovascular: Normal rate, regular rhythm. Grossly normal heart sounds.  Good peripheral circulation. Respiratory: marked tachypnea, use of accessory muscless, faint expiratory wheeze, diminished BS throughout Gastrointestinal: Soft and nontender. No distention. No abdominal bruits. No CVA tenderness. Genitourinary:  Musculoskeletal: No lower extremity tenderness. 1+ BLEedema.  No joint effusions. Neurologic:  Normal speech and language. No gross focal neurologic deficits are appreciated. No facial droop Skin:  Skin is warm, dry and intact. No rash noted. Psychiatric: Mood and affect are normal. Speech and behavior are normal.  ____________________________________________   LABS (all labs ordered are listed, but only abnormal results are displayed)  Results for orders placed or performed during the hospital encounter of 10/10/20 (from the past 24 hour(s))  Basic metabolic panel     Status: Abnormal   Collection Time: 10/10/20 11:34 AM  Result Value Ref Range  Sodium 143 135 - 145 mmol/L   Potassium 3.9 3.5 - 5.1 mmol/L   Chloride 112 (H) 98 - 111 mmol/L   CO2 21 (L) 22 - 32 mmol/L   Glucose, Bld 238 (H) 70 - 99 mg/dL   BUN 39 (H) 6 - 20 mg/dL   Creatinine, Ser 3.86 (H) 0.44 - 1.00 mg/dL   Calcium 8.2 (L) 8.9 - 10.3 mg/dL   GFR, Estimated 13 (L) >60 mL/min   Anion gap 10 5 - 15  CBC     Status: Abnormal   Collection Time: 10/10/20 11:34 AM  Result Value Ref Range   WBC 11.8 (H) 4.0 - 10.5 K/uL   RBC 3.26 (L) 3.87 - 5.11 MIL/uL   Hemoglobin 9.6 (L) 12.0 - 15.0 g/dL   HCT 29.7 (L) 36 - 46 %   MCV 91.1 80.0 - 100.0 fL   MCH 29.4 26.0 - 34.0 pg   MCHC 32.3 30.0 - 36.0 g/dL   RDW 15.5 11.5 - 15.5 %   Platelets 324 150 - 400 K/uL   nRBC 0.0 0.0 - 0.2 %  Troponin I (High Sensitivity)     Status: Abnormal   Collection Time: 10/10/20 11:34 AM  Result Value Ref Range   Troponin I (High Sensitivity) 19 (H) <18 ng/L  Brain natriuretic peptide     Status: Abnormal   Collection  Time: 10/10/20 11:34 AM  Result Value Ref Range   B Natriuretic Peptide 1,251.0 (H) 0.0 - 100.0 pg/mL  Troponin I (High Sensitivity)     Status: Abnormal   Collection Time: 10/10/20  3:07 PM  Result Value Ref Range   Troponin I (High Sensitivity) 21 (H) <18 ng/L  Blood gas, venous     Status: Abnormal   Collection Time: 10/10/20  6:48 PM  Result Value Ref Range   pH, Ven 7.32 7.25 - 7.43   pCO2, Ven 41 (L) 44 - 60 mmHg   pO2, Ven 37.0 32 - 45 mmHg   Patient temperature 37.0    Collection site VENOUS    Sample type VENOUS   Lactic acid, plasma     Status: None   Collection Time: 10/10/20  7:33 PM  Result Value Ref Range   Lactic Acid, Venous 1.2 0.5 - 1.9 mmol/L   ____________________________________________  EKG My review and personal interpretation at Time: 11:32   Indication: Shortness of breath rate: 65 rhythm: sinus Axis: normal Other: ST and T wave abnormalities in lateral leads STEMI criteria ____________________________________________  RADIOLOGY  I personally reviewed all radiographic images ordered to evaluate for the above acute complaints and reviewed radiology reports and findings.  These findings were personally discussed with the patient.  Please see medical record for radiology report.  ____________________________________________   PROCEDURES  Procedure(s) performed:  .Critical Care Performed by: Merlyn Lot, MD Authorized by: Merlyn Lot, MD   Critical care provider statement:    Critical care time (minutes):  40   Critical care time was exclusive of:  Separately billable procedures and treating other patients   Critical care was necessary to treat or prevent imminent or life-threatening deterioration of the following conditions:  Respiratory failure   Critical care was time spent personally by me on the following activities:  Development of treatment plan with patient or surrogate, discussions with consultants, evaluation of patient's  response to treatment, examination of patient, obtaining history from patient or surrogate, ordering and performing treatments and interventions, ordering and review of laboratory studies, ordering and review of  radiographic studies, pulse oximetry, re-evaluation of patient's condition and review of old [place] performed: yes ____________________________________________   INITIAL IMPRESSION / ASSESSMENT AND PLAN / ED COURSE  Pertinent labs & imaging results that were available during my care of the patient were reviewed by me and considered in my medical decision making (see chart for details).   DDX: Asthma, copd, CHF, pna, ptx, malignancy, Pe, anemia   Terry Arroyo is a 59 y.o. who presents to the ED with presentation as described above.  Patient is critically ill-appearing moderate respiratory distress no hypoxia but appears to be having acute COPD exacerbation.  Will be placed on BiPAP.  Will order duo nebs.  Troponins in triage were stable.  Have a lower suspicion from primary ACS.  Elevation likely secondary to demand ischemia in the setting of respiratory illness.  Have a lower suspicion for PE.  Clinical Course as of Oct 10 2037  Fri Oct 10, 2020  1949 Patient appears significantly more comfortable on BiPAP.  Does seem to be bronchodilator responsive.  Given her respiratory distress and possible infiltrate on chest x-ray will cover with antibiotics she does have mild leukocytosis.     [PR]  2034 Case discussed with hospitalist.  As she is on BiPAP has requested holding off until we have her Covid test back to determine appropriate disposition.  Patient clinically appears to be improving much more comfortable on BiPAP still with wheezing and decreased air movement but it is improving.   [PR]    Clinical Course User Index [PR] Merlyn Lot, MD    The patient was evaluated in Emergency Department today for the symptoms described in the history of present  illness. He/she was evaluated in the context of the global COVID-19 pandemic, which necessitated consideration that the patient might be at risk for infection with the SARS-CoV-2 virus that causes COVID-19. Institutional protocols and algorithms that pertain to the evaluation of patients at risk for COVID-19 are in a state of rapid change based on information released by regulatory bodies including the CDC and federal and state organizations. These policies and algorithms were followed during the patient's care in the ED.  As part of my medical decision making, I reviewed the following data within the [facility] notes reviewed and incorporated, Labs reviewed, notes from prior ED visits and [place] Controlled Substance Database   ____________________________________________   FINAL CLINICAL IMPRESSION(S) / ED DIAGNOSES  Final diagnoses:  COPD exacerbation ([place])  Acute respiratory distress      NEW MEDICATIONS STARTED DURING THIS VISIT:  New Prescriptions   No medications on file     Note:  This document was prepared using Dragon voice recognition software and may include unintentional dictation errors.    Merlyn Lot, MD 10/10/20 2038

## 2020-10-10 NOTE — ED Notes (Signed)
RN and charge nurse Levada Dy attempted to place an IV in this patient

## 2020-10-10 NOTE — ED Triage Notes (Signed)
Reports SOB upon awakening this AM with bilateral leg swelling. Pt reports normal SOB on exertion but typically is not SOB at rest. Pt alert and oriented X4, cooperative, RR even and unlabored, color WNL. Pt in NAD.

## 2020-10-10 NOTE — ED Notes (Addendum)
Pt c/o dyspnea with exertion and intermittent cough that is worse when she is laying down since last night. Pt c/o of pain in her back and legs. 1+ pitting edema noted in lower extremities bilaterally. Pt denies exposure to known illness. Pt states swelling in legs occurred gradually. Pt does not want to elevate legs at this time, is sitting on edge of bed.

## 2020-10-10 NOTE — ED Notes (Signed)
Pt assisted to toilet 

## 2020-10-10 NOTE — H&P (Addendum)
[place]   PATIENT NAME: Terry Arroyo    MR#:  [id number]  DATE OF BIRTH:  04/01/1961  DATE OF ADMISSION:  10/10/2020  PRIMARY CARE PHYSICIAN: Kirk Ruths, MD   REQUESTING/REFERRING PHYSICIAN: Blake Divine, MD  CHIEF COMPLAINT:   Chief Complaint  Patient presents with  . Shortness of Breath  . Leg Swelling    HISTORY OF PRESENT ILLNESS:  Terry Arroyo  is a 59 y.o. female with a known history of CHF, type 2 diabetes mellitus, hypertension, dyslipidemia and coronary artery disease, who presented to the emergency room with acute onset of worsening dyspnea with associated cough productive of whitish sputum and wheezing over the last 3 weeks with significant respiratory distress today.  She denied any fever or chills.  No nausea or vomiting or abdominal pain.  No rhinorrhea or nasal congestion or sore throat or earache.  No chest pain or palpitations.  She has been having mild diarrhea.  She was in significant respiratory distress in the ER tripoding when she was placed on BiPAP.  Upon presentation to the emergency room, blood pressure was 190/70 with initial pulse oximetry that was high 100% later 94% on room air and with worsening respiratory distress 91%, respiratory rate of 22-24 with significant increased labored breathing when she was placed on BiPAP with pulse oximetry 90% on 40% FiO2 on BiPAP.  COVID-19 PCR came back negative as well as influenza antigens.  Blood cultures were drawn.  Portable chest ray showed interval development of nodular asymmetric pulmonary infiltrate, likely infectious or inflammatory.  Glucose was 238 with a BUN of 39 creatinine of 3.86 above previous levels and BNP 1251 with high-sensitivity troponin I of 19 and later 21.  CBC showed leukocytosis of 11.8 with anemia close to baseline.  The patient was given IV cefepime and Zithromax well as 3 DuoNeb's, 1 mg of IM Ativan and 2 g of IV magnesium sulfate as well as 125 mg of IV Solu-Medrol.   She will be admitted to progressive unit bed for further evaluation and management. Marland Kitchen PAST MEDICAL HISTORY:   Past Medical History:  Diagnosis Date  . Blind left eye   . Brain tumor ([place]) 1986  . CHF (congestive heart failure) ([place])   . Diabetes mellitus without complication ([place])   . Hyperlipidemia   . Hypertension   . MI (myocardial infarction) ([place]) 2018    PAST SURGICAL HISTORY:   Past Surgical History:  Procedure Laterality Date  . BRAIN SURGERY    . BREAST EXCISIONAL BIOPSY Left yrs ago   benign  . CESAREAN SECTION      SOCIAL HISTORY:   Social History   Tobacco Use  . Smoking status: Former Research scientist (life sciences)  . Smokeless tobacco: Never Used  Substance Use Topics  . Alcohol use: Not Currently    FAMILY HISTORY:   Family History  Problem Relation Age of Onset  . CAD Mother   . CAD Brother   . Breast cancer Sister 52    DRUG ALLERGIES:   Allergies  Allergen Reactions  . Hydralazine Shortness Of Breath and Swelling    Body aches  . Lisinopril Swelling  . Shellfish Allergy Anaphylaxis    Shrimp/lobster  Shrimp/lobster    . Kiwi Extract Swelling  . Metformin Other (See Comments)    Other reaction(s): GI Intolerance  . Tape Itching    Use paper tape whenever possible    REVIEW OF SYSTEMS:   ROS As per history of present  illness. All pertinent systems were reviewed above. Constitutional, HEENT, cardiovascular, respiratory, GI, GU, musculoskeletal, neuro, psychiatric, endocrine, integumentary and hematologic systems were reviewed and are otherwise negative/unremarkable except for positive findings mentioned above in the HPI.   MEDICATIONS AT HOME:   Prior to Admission medications   Medication Sig Start Date End Date Taking? Authorizing Provider  amLODipine (NORVASC) 10 MG tablet Take 1 tablet (10 mg total) by mouth daily. 03/03/20   Lorella Nimrod, MD  aspirin 81 MG EC tablet Take 1 tablet (81 mg total) by mouth daily. Do not take this medication for 1 week  and then restart. 09/15/20   Wyvonnia Dusky, MD  atorvastatin (LIPITOR) 80 MG tablet Take 1 tablet (80 mg total) by mouth daily at 6 PM. 11/26/19   Lavina Hamman, MD  atropine 1 % ophthalmic solution Place 1 drop into the right eye 2 (two) times daily. 07/21/20   [provider]  brimonidine (ALPHAGAN) 0.2 % ophthalmic solution Place 1 drop into the right eye 3 (three) times daily. 08/22/20   [provider]  carvedilol (COREG) 12.5 MG tablet Take 12.5 mg by mouth 2 (two) times daily with a meal.    [provider]  cyclobenzaprine (FLEXERIL) 5 MG tablet Take 5 mg by mouth 2 (two) times daily as needed for muscle spasms. 09/01/20   [provider]  Fluticasone-Salmeterol (ADVAIR) 250-50 MCG/DOSE AEPB Inhale 1 puff into the lungs 2 (two) times daily.    [provider]  furosemide (LASIX) 40 MG tablet Take 1 tablet (40 mg total) by mouth daily. 03/02/20   Lorella Nimrod, MD  insulin aspart protamine- aspart (NOVOLOG MIX 70/30) (70-30) 100 UNIT/ML injection Inject 0.4 mLs (40 Units total) into the skin 2 (two) times daily with a meal. Patient taking differently: Inject 45 Units into the skin 2 (two) times daily with a meal.  11/26/19   Lavina Hamman, MD  isosorbide mononitrate (IMDUR) 30 MG 24 hr tablet Take 30 mg by mouth daily.    [provider]  ketorolac (ACULAR) 0.5 % ophthalmic solution Place 1 drop into the right eye 4 (four) times daily. 08/22/20   [provider]  metoprolol succinate (TOPROL-XL) 50 MG 24 hr tablet Take 50 mg by mouth daily. 05/06/20   [provider]  mirtazapine (REMERON) 30 MG tablet Take 1 tablet (30 mg total) by mouth at bedtime. 11/26/19   Lavina Hamman, MD  mometasone-formoterol [facility]) 200-5 MCG/ACT AERO Inhale 2 puffs into the lungs 2 (two) times daily.    [provider]  ofloxacin (OCUFLOX) 0.3 % ophthalmic solution Place 1 drop into the right eye 4 (four) times daily. 08/22/20   [provider]  omeprazole (PRILOSEC) 20 MG capsule Take 20 mg by mouth daily. 09/22/20   [provider]  pantoprazole (PROTONIX) 40 MG tablet Take 1 tablet (40 mg total) by mouth daily. 09/16/20 10/16/20  Wyvonnia Dusky, MD  prednisoLONE acetate (PRED FORTE) 1 % ophthalmic suspension Place 1 drop into the right eye 4 (four) times daily. 08/22/20   [provider]  topiramate (TOPAMAX) 25 MG capsule Take 25 mg by mouth daily.    [provider]  topiramate (TOPAMAX) 25 MG tablet Take 25 mg by mouth daily. 08/19/20   [provider]  valACYclovir (VALTREX) 500 MG tablet Take 1 tablet by mouth 2 (two) times daily. 05/20/17   [provider]      VITAL SIGNS:  Blood pressure Marland Kitchen)  178/92, pulse 69, temperature 98 F (36.7 C), temperature source Oral, resp. rate (!) 24, height 5\' 6"  (1.676 m), weight 83.9 kg, SpO2 97 %.  PHYSICAL EXAMINATION:  Physical Exam  GENERAL:  59 y.o.-year-old female patient lying in the bed with mild respiratory distress on BiPAP. EYES: Pupils equal, round, reactive to light and accommodation. No scleral icterus. Extraocular muscles intact.  HEENT: Head atraumatic, normocephalic. Oropharynx and nasopharynx clear.  NECK:  Supple, no jugular venous distention. No thyroid enlargement, no tenderness.  LUNGS: Diminished bibasal breath sounds with bibasal crackles as well as diminished expiratory airflow with residual expiratory wheezes and harsh vesicular breathing. CARDIOVASCULAR: Regular rate and rhythm, S1, S2 normal. No murmurs, rubs, or gallops.  ABDOMEN: Soft, nondistended, nontender. Bowel sounds present. No organomegaly or mass.  EXTREMITIES: Trace bilateral lower extremity pitting edema, with no cyanosis, or clubbing.  NEUROLOGIC: Cranial nerves II through XII are intact. Muscle strength 5/5 in all extremities. Sensation intact. Gait not checked.  PSYCHIATRIC: The patient is alert and oriented x 3.  Normal affect and good  eye contact. SKIN: No obvious rash, lesion, or ulcer.   LABORATORY PANEL:   CBC Recent Labs  Lab 10/10/20 1134  WBC 11.8*  HGB 9.6*  HCT 29.7*  PLT 324   ------------------------------------------------------------------------------------------------------------------  Chemistries  Recent Labs  Lab 10/10/20 1134  NA 143  K 3.9  CL 112*  CO2 21*  GLUCOSE 238*  BUN 39*  CREATININE 3.86*  CALCIUM 8.2*   ------------------------------------------------------------------------------------------------------------------  Cardiac Enzymes No results for input(s): TROPONINI in the last 168 hours. ------------------------------------------------------------------------------------------------------------------  RADIOLOGY:  DG Chest 2 View  Result Date: 10/10/2020 CLINICAL DATA:  Dyspnea EXAM: CHEST - 2 VIEW COMPARISON:  02/26/2020 FINDINGS: The lungs are symmetrically well expanded. Tiny right pleural effusion has developed. Multifocal nodular infiltrate has developed within the left lung, likely infectious or inflammatory in nature. Minimal infiltrate may be present at the right lung base. No pneumothorax. Coronary artery bypass grafting has been performed. Cardiac size within normal limits. No acute bone abnormality. IMPRESSION: Interval development of nodular asymmetric pulmonary infiltrate, likely infectious or inflammatory. Electronically Signed   By: Fidela Salisbury MD   On: 10/10/2020 12:23      IMPRESSION AND PLAN:   1.  Acute respiratory failure with hypoxia secondary to severe COPD exacerbation. -The patient will be admitted to a progressive unit bed. -We will continue BiPAP overnight. -This will be tapered as tolerated to nasal cannula. -We will continue steroid therapy with IV Solu-Medrol as well as scheduled and as needed duo nebs. -We will continue antibiotic therapy with IV Rocephin and Zithromax.  2.  Community-acquired pneumonia likely the culprit for severe  COPD exacerbation and contributing to acute respiratory failure. -The patient tested negative for COVID-19. -She will be placed on IV Rocephin and just a max. -Mucolytic therapy will be provided. -We will follow sputum and blood cultures. -We will obtain pneumonia antigens.  3.  Hypertensive urgency. -We will continue Norvasc and Coreg. -The patient will be placed on as needed IV labetalol.  4.  Type 2 diabetes mellitus without complications. -We will place the patient on supplemental coverage with NovoLog and continue basal coverage.  5.  Dyslipidemia. -We will continue TriCor and statin therapy.  6.  Glaucoma. -Ophthalmology GTTs will be continued.  7.  GERD. -We will continue PPI therapy.  8.  DVT prophylaxis. -Subcutaneous Lovenox   All the records are reviewed and case discussed with ED provider. The plan of care was discussed  in details with the patient (and family). I answered all questions. The patient agreed to proceed with the above mentioned plan. Further management will depend upon hospital course.   CODE STATUS: Full code  Status is: Inpatient  Remains inpatient appropriate because:Hemodynamically unstable, Ongoing diagnostic testing needed not appropriate for outpatient work up, Unsafe d/c plan, IV treatments appropriate due to intensity of illness or inability to take PO and Inpatient level of care appropriate due to severity of illness   Dispo: The patient is from: Home              Anticipated d/c is to: Home              Anticipated d/c date is: 3 days              Patient currently is not medically stable to d/c.   TOTAL TIME TAKING CARE OF THIS PATIENT: 55 minutes.    Christel Mormon M.D on 10/10/2020 at 10:33 PM  [facility]   From 7 PM-7 AM, contact night-coverage [email or web address]  CC: Primary care physician; Kirk Ruths, MD

## 2020-10-11 DIAGNOSIS — E1159 Type 2 diabetes mellitus with other circulatory complications: Secondary | ICD-10-CM

## 2020-10-11 DIAGNOSIS — I5020 Unspecified systolic (congestive) heart failure: Secondary | ICD-10-CM

## 2020-10-11 DIAGNOSIS — I152 Hypertension secondary to endocrine disorders: Secondary | ICD-10-CM

## 2020-10-11 DIAGNOSIS — R0603 Acute respiratory distress: Secondary | ICD-10-CM

## 2020-10-11 LAB — BASIC METABOLIC PANEL
Anion gap: 10 (ref 5–15)
BUN: 41 mg/dL — ABNORMAL HIGH (ref 6–20)
CO2: 20 mmol/L — ABNORMAL LOW (ref 22–32)
Calcium: 8.4 mg/dL — ABNORMAL LOW (ref 8.9–10.3)
Chloride: 112 mmol/L — ABNORMAL HIGH (ref 98–111)
Creatinine, Ser: 3.79 mg/dL — ABNORMAL HIGH (ref 0.44–1.00)
GFR, Estimated: 13 mL/min — ABNORMAL LOW (ref >60)
Glucose, Bld: 275 mg/dL — ABNORMAL HIGH (ref 70–99)
Potassium: 4 mmol/L (ref 3.5–5.1)
Sodium: 142 mmol/L (ref 135–145)

## 2020-10-11 LAB — EXPECTORATED SPUTUM ASSESSMENT W GRAM STAIN, RFLX TO RESP C

## 2020-10-11 LAB — CBC
HCT: 31.6 % — ABNORMAL LOW (ref 36.0–46.0)
Hemoglobin: 10.4 g/dL — ABNORMAL LOW (ref 12.0–15.0)
MCH: 29.6 pg (ref 26.0–34.0)
MCHC: 32.9 g/dL (ref 30.0–36.0)
MCV: 90 fL (ref 80.0–100.0)
Platelets: 353 10*3/uL (ref 150–400)
RBC: 3.51 MIL/uL — ABNORMAL LOW (ref 3.87–5.11)
RDW: 15.5 % (ref 11.5–15.5)
WBC: 12 10*3/uL — ABNORMAL HIGH (ref 4.0–10.5)
nRBC: 0 % (ref 0.0–0.2)

## 2020-10-11 LAB — GLUCOSE, CAPILLARY
Glucose-Capillary: 127 mg/dL — ABNORMAL HIGH (ref 70–99)
Glucose-Capillary: 156 mg/dL — ABNORMAL HIGH (ref 70–99)
Glucose-Capillary: 217 mg/dL — ABNORMAL HIGH (ref 70–99)
Glucose-Capillary: 272 mg/dL — ABNORMAL HIGH (ref 70–99)
Glucose-Capillary: 275 mg/dL — ABNORMAL HIGH (ref 70–99)
Glucose-Capillary: 348 mg/dL — ABNORMAL HIGH (ref 70–99)
Glucose-Capillary: 56 mg/dL — ABNORMAL LOW (ref 70–99)
Glucose-Capillary: 65 mg/dL — ABNORMAL LOW (ref 70–99)

## 2020-10-11 MED ORDER — POLYVINYL ALCOHOL 1.4 % OP SOLN
1.0000 [drp] | Freq: Four times a day (QID) | OPHTHALMIC | Status: DC
  Administered 2020-10-11 – 2020-10-12 (×3): 1 [drp] via OPHTHALMIC
  Filled 2020-10-11 (×2): qty 15

## 2020-10-11 MED ORDER — IPRATROPIUM-ALBUTEROL 0.5-2.5 (3) MG/3ML IN SOLN
3.0000 mL | Freq: Four times a day (QID) | RESPIRATORY_TRACT | Status: DC
  Administered 2020-10-11 – 2020-10-12 (×3): 3 mL via RESPIRATORY_TRACT
  Filled 2020-10-11 (×4): qty 3

## 2020-10-11 MED ORDER — VALACYCLOVIR HCL 500 MG PO TABS: 500.0000 mg | ORAL_TABLET | ORAL | Status: DC

## 2020-10-11 MED ORDER — FUROSEMIDE 10 MG/ML IJ SOLN
40.0000 mg | Freq: Every day | INTRAMUSCULAR | Status: DC
  Administered 2020-10-11: 40 mg via INTRAVENOUS
  Filled 2020-10-11: qty 4

## 2020-10-11 MED ORDER — FENOFIBRATE 160 MG PO TABS
160.0000 mg | ORAL_TABLET | Freq: Every day | ORAL | Status: DC
  Administered 2020-10-11 – 2020-10-12 (×2): 160 mg via ORAL
  Filled 2020-10-11 (×2): qty 1

## 2020-10-11 MED ORDER — GLYCERIN-HYPROMELLOSE-PEG 400 0.2-0.2-1 % OP SOLN: 1.0000 [drp] | Freq: Four times a day (QID) | OPHTHALMIC | Status: DC

## 2020-10-11 MED ORDER — SODIUM BICARBONATE 650 MG PO TABS
650.0000 mg | ORAL_TABLET | Freq: Three times a day (TID) | ORAL | Status: DC
  Administered 2020-10-11 (×2): 650 mg via ORAL
  Filled 2020-10-11 (×4): qty 1

## 2020-10-11 NOTE — ED Notes (Signed)
Message sent to pharmacy requesting evaluation of need to re-time dose of Heparin in order to be in keeping with ordered schedule of Q8 hr based off first dose being at 0000. Awaiting response from pharmacist.

## 2020-10-11 NOTE — Progress Notes (Signed)
PROGRESS NOTE    Terry Arroyo  JJK:[id number] DOB: 11/04/1961 DOA: 10/10/2020 PCP: Kirk Ruths, MD   Brief Narrative: Taken from H&P Terry Arroyo  is a 59 y.o. female with a known history of CHF, type 2 diabetes mellitus, hypertension, dyslipidemia and coronary artery disease, who presented to the emergency room with acute onset of worsening dyspnea with associated cough productive of whitish sputum and wheezing over the last 3 weeks with significant respiratory distress today. She was admitted for COPD exacerbation, initially requiring BiPAP, transition to nasal cannula this morning.  COVID-19 negative, BNP at 1251 Chest x-ray concerning for a new infiltrate so she received cefepime and Zithromax in ED and continue with ceftriaxone and Zithromax.  Subjective: Patient was feeling little better when seen today.  She was still on BiPAP but saturating 100%.  Able to speak in full sentences.  She does not use oxygen at home.  Successfully transitioned her to nasal cannula without any significant drop in her saturation.  Assessment & Plan:   Active Problems:   COPD exacerbation ([place])  Acute hypoxic respiratory failure secondary to COPD exacerbation and community-acquired pneumonia.  Patient remained afebrile, mild leukocytosis.  Clinically seems improving.  She was desaturating initially requiring BiPAP.  Now able to transition her to nasal cannula.  Does not use oxygen at home.  Blood cultures negative so far.  Strep pneumo and Legionella labs were never sent, although ordered.  She did received Solu-Medrol on admission. -Continue with DuoNeb. -Continue with Rocephin and Zithromax. -Continue with supportive care. -Incentive spirometry and flutter valve.  Hypertension.  Patient had elevated blood pressure on admission.  Remained mildly elevated but improved. -Continue with Norvasc and Coreg. -Continue with as needed labetalol  HFrEF.  Patient has EF of 30 to 35% on an echo done  in March 2021. Having 2+ lower extremity edema.  Taking torsemide at home. -Start her on IV Lasix 40 mg daily-can be converted to home dose of torsemide once lower extremity edema improves. -Continue with carvedilol. -Daily BMP and weight. -Strict intake and output.  AKI with CKD stage IIIb.  Slight improvement in creatinine to 3.79 this morning, baseline around 3.1-3.5. -Continue to monitor. -Avoid nephrotoxins.  Metabolic acidosis.  Most likely secondary to renal disease. -Start her on p.o. bicarb.  Type 2 diabetes mellitus with renal complications.  CBG elevated.  A1c pending.  Patient did receive a dose of Solu-Medrol. -Continue home dose of 70/30, 45 units twice daily. -Continue with SSI  Dyslipidemia. -Continue with Lipitor and fenofibrate.  History of glaucoma.  A lot of IV preparations were listed in her chart.  Apparently not taking any. -We will ask pharmacy to appropriately restart home medications.  GERD. -Continue with PPI  Depression.  No acute concern. -Continue home dose of Remeron  Objective: Vitals:   10/11/20 0900 10/11/20 0922 10/11/20 1000 10/11/20 1100  BP: (!) 150/100  (!) 162/102 (!) 164/89  Pulse: 70  74 66  Resp:    20  Temp:      TempSrc:      SpO2: 97% 100% 98% 99%  Weight:      Height:        Intake/Output Summary (Last 24 hours) at 10/11/2020 1316 Last data filed at 10/11/2020 1126 Gross per 24 hour  Intake 1367.71 ml  Output --  Net 1367.71 ml   Filed Weights   10/10/20 1131  Weight: 83.9 kg    Examination:  General exam: Appears calm and comfortable  Respiratory  system: Clear to auscultation. Respiratory effort normal. Cardiovascular system: S1 & S2 heard, RRR.  Gastrointestinal system: Soft, nontender, nondistended, bowel sounds positive. Central nervous system: Alert and oriented. No focal neurological deficits. Extremities: 2+ LE edema, no cyanosis, pulses intact and symmetrical. Psychiatry: Judgement and insight appear  normal. Mood & affect appropriate.    DVT prophylaxis: Heparin Code Status: Full Family Communication: Father was updated on phone. Disposition Plan:  Status is: Inpatient  Remains inpatient appropriate because:Inpatient level of care appropriate due to severity of illness   Dispo: The patient is from: Home              Anticipated d/c is to: Home              Anticipated d/c date is: 2 days              Patient currently is not medically stable to d/c.   Consultants:   None  Procedures:  Antimicrobials:  Ceftriaxone Zithromycin  Data Reviewed: I have personally reviewed following labs and imaging studies  CBC: Recent Labs  Lab 10/10/20 1134 10/11/20 0343  WBC 11.8* 12.0*  HGB 9.6* 10.4*  HCT 29.7* 31.6*  MCV 91.1 90.0  PLT 324 361   Basic Metabolic Panel: Recent Labs  Lab 10/10/20 1134 10/11/20 0343  NA 143 142  K 3.9 4.0  CL 112* 112*  CO2 21* 20*  GLUCOSE 238* 275*  BUN 39* 41*  CREATININE 3.86* 3.79*  CALCIUM 8.2* 8.4*   GFR: Estimated Creatinine Clearance: 17.6 mL/min (A) (by C-G formula based on SCr of 3.79 mg/dL (H)). Liver Function Tests: No results for input(s): AST, ALT, ALKPHOS, BILITOT, PROT, ALBUMIN in the last 168 hours. No results for input(s): LIPASE, AMYLASE in the last 168 hours. No results for input(s): AMMONIA in the last 168 hours. Coagulation Profile: No results for input(s): INR, PROTIME in the last 168 hours. Cardiac Enzymes: No results for input(s): CKTOTAL, CKMB, CKMBINDEX, TROPONINI in the last 168 hours. BNP (last 3 results) No results for input(s): PROBNP in the last 8760 hours. HbA1C: No results for input(s): HGBA1C in the last 72 hours. CBG: Recent Labs  Lab 10/11/20 0027 10/11/20 0312 10/11/20 0857  GLUCAP 217* 275* 272*   Lipid Profile: No results for input(s): CHOL, HDL, LDLCALC, TRIG, CHOLHDL, LDLDIRECT in the last 72 hours. Thyroid Function Tests: No results for input(s): TSH, T4TOTAL, FREET4, T3FREE,  THYROIDAB in the last 72 hours. Anemia Panel: No results for input(s): VITAMINB12, FOLATE, FERRITIN, TIBC, IRON, RETICCTPCT in the last 72 hours. Sepsis Labs: Recent Labs  Lab 10/10/20 1933  LATICACIDVEN 1.2    Recent Results (from the past 240 hour(s))  Respiratory Panel by RT PCR (Flu A&B, Covid) - Nasopharyngeal Swab     Status: None   Collection Time: 10/10/20  6:47 PM   Specimen: Nasopharyngeal Swab  Result Value Ref Range Status   SARS Coronavirus 2 by RT PCR NEGATIVE NEGATIVE Final    Comment: (NOTE) SARS-CoV-2 target nucleic acids are NOT DETECTED.  The SARS-CoV-2 RNA is generally detectable in upper respiratoy specimens during the acute phase of infection. The lowest concentration of SARS-CoV-2 viral copies this assay can detect is 131 copies/mL. A negative result does not preclude SARS-Cov-2 infection and should not be used as the sole basis for treatment or other patient management decisions. A negative result may occur with  improper specimen collection/handling, submission of specimen other than nasopharyngeal swab, presence of viral mutation(s) within the areas targeted by this  assay, and inadequate number of viral copies (<131 copies/mL). A negative result must be combined with clinical observations, patient history, and epidemiological information. The expected result is Negative.  Fact Sheet for Patients:  PinkCheek.be  Fact Sheet for Healthcare Providers:  GravelBags.it  This test is no t yet approved or cleared by the [place] FDA and  has been authorized for detection and/or diagnosis of SARS-CoV-2 by FDA under an Emergency Use Authorization (EUA). This EUA will remain  in effect (meaning this test can be used) for the duration of the COVID-19 declaration under Section 564(b)(1) of the Act, 21 U.S.C. section 360bbb-3(b)(1), unless the authorization is terminated or revoked sooner.      Influenza A by PCR NEGATIVE NEGATIVE Final   Influenza B by PCR NEGATIVE NEGATIVE Final    Comment: (NOTE) The Xpert Xpress SARS-CoV-2/FLU/RSV assay is intended as an aid in  the diagnosis of influenza from Nasopharyngeal swab specimens and  should not be used as a sole basis for treatment. Nasal washings and  aspirates are unacceptable for Xpert Xpress SARS-CoV-2/FLU/RSV  testing.  Fact Sheet for Patients: PinkCheek.be  Fact Sheet for Healthcare Providers: GravelBags.it  This test is not yet approved or cleared by the [place] FDA and  has been authorized for detection and/or diagnosis of SARS-CoV-2 by  FDA under an Emergency Use Authorization (EUA). This EUA will remain  in effect (meaning this test can be used) for the duration of the  Covid-19 declaration under Section 564(b)(1) of the Act, 21  U.S.C. section 360bbb-3(b)(1), unless the authorization is  terminated or revoked. Performed at [facility], [address] [place], [place] [address]   Blood culture (routine x 2)     Status: None (Preliminary result)   Collection Time: 10/10/20  7:34 PM   Specimen: BLOOD  Result Value Ref Range Status   Specimen Description BLOOD LEFT ANTECUBITAL  Final   Special Requests   Final    BOTTLES DRAWN AEROBIC AND ANAEROBIC Blood Culture adequate volume   Culture   Final    NO GROWTH < 12 HOURS Performed at [facility] [address] [place], [place] [address]    Report Status PENDING  Incomplete  Blood culture (routine x 2)     Status: None (Preliminary result)   Collection Time: 10/10/20  7:35 PM   Specimen: BLOOD  Result Value Ref Range Status   Specimen Description BLOOD BLOOD RIGHT ARM  Final   Special Requests   Final    BOTTLES DRAWN AEROBIC AND ANAEROBIC Blood Culture adequate volume   Culture   Final    NO GROWTH < 12 HOURS Performed at [facility], [address]  [place], [place] [address]    Report Status PENDING  Incomplete  Culture, sputum-assessment     Status: None   Collection Time: 10/11/20 12:52 AM   Specimen: Sputum  Result Value Ref Range Status   Specimen Description SPUTUM  Final   Special Requests NONE  Final   Sputum evaluation   Final    Sputum specimen not acceptable for testing.  Please recollect.   NOTIFIED Susette Racer 10/11/20 AT 0205 HS Performed at [facility], [address] [place], [place] [address]    Report Status 10/11/2020 FINAL  Final     Radiology Studies: DG Chest 2 View  Result Date: 10/10/2020 CLINICAL DATA:  Dyspnea EXAM: CHEST - 2 VIEW COMPARISON:  02/26/2020 FINDINGS: The lungs are symmetrically well expanded. Tiny right  pleural effusion has developed. Multifocal nodular infiltrate has developed within the left lung, likely infectious or inflammatory in nature. Minimal infiltrate may be present at the right lung base. No pneumothorax. Coronary artery bypass grafting has been performed. Cardiac size within normal limits. No acute bone abnormality. IMPRESSION: Interval development of nodular asymmetric pulmonary infiltrate, likely infectious or inflammatory. Electronically Signed   By: Fidela Salisbury MD   On: 10/10/2020 12:23    Scheduled Meds: . amLODipine  10 mg Oral Daily  . aspirin EC  81 mg Oral Daily  . atorvastatin  80 mg Oral q1800  . carvedilol  12.5 mg Oral BID WC  . furosemide  40 mg Intravenous Daily  . heparin injection (subcutaneous)  5,000 Units Subcutaneous Q8H  . insulin aspart  0-9 Units Subcutaneous TID PC & HS  . insulin aspart protamine- aspart  45 Units Subcutaneous BID WC  . isosorbide mononitrate  30 mg Oral Daily  . LORazepam  1 mg Intramuscular Once  . metoprolol succinate  50 mg Oral Daily  . mirtazapine  30 mg Oral QHS  . pantoprazole  40 mg Oral Daily  . topiramate  25 mg Oral Daily  . valACYclovir  500 mg Oral BID   Continuous Infusions: . sodium chloride 100  mL/hr at 10/11/20 1242  . azithromycin    . cefTRIAXone (ROCEPHIN)  IV       LOS: 1 day   Time spent: 40 minutes.  Lorella Nimrod, MD [facility]  If 7PM-7AM, please contact night-coverage [email or web address]  10/11/2020, 1:16 PM   This record has been created using Systems analyst. Errors have been sought and corrected,but may not always be located. Such creation errors do not reflect on the standard of care.

## 2020-10-11 NOTE — ED Notes (Signed)
Advised nurse that patient has assigned bed 

## 2020-10-11 NOTE — ED Notes (Signed)
Pt ambulatory to toilet

## 2020-10-11 NOTE — ED Notes (Signed)
Hospitalist at bedside. Hospitalist requests for this nurse to call RT to remove bipap. Tried call Rt, no answer. Will call back.

## 2020-10-11 NOTE — ED Notes (Signed)
Pt ambulated to void in toilet.

## 2020-10-12 LAB — CBC
HCT: 28.4 % — ABNORMAL LOW (ref 36.0–46.0)
Hemoglobin: 9.1 g/dL — ABNORMAL LOW (ref 12.0–15.0)
MCH: 29.2 pg (ref 26.0–34.0)
MCHC: 32 g/dL (ref 30.0–36.0)
MCV: 91 fL (ref 80.0–100.0)
Platelets: 301 10*3/uL (ref 150–400)
RBC: 3.12 MIL/uL — ABNORMAL LOW (ref 3.87–5.11)
RDW: 15.2 % (ref 11.5–15.5)
WBC: 13.3 10*3/uL — ABNORMAL HIGH (ref 4.0–10.5)
nRBC: 0 % (ref 0.0–0.2)

## 2020-10-12 LAB — RENAL FUNCTION PANEL
Albumin: 2 g/dL — ABNORMAL LOW (ref 3.5–5.0)
Anion gap: 11 (ref 5–15)
BUN: 57 mg/dL — ABNORMAL HIGH (ref 6–20)
CO2: 17 mmol/L — ABNORMAL LOW (ref 22–32)
Calcium: 8.1 mg/dL — ABNORMAL LOW (ref 8.9–10.3)
Chloride: 111 mmol/L (ref 98–111)
Creatinine, Ser: 3.87 mg/dL — ABNORMAL HIGH (ref 0.44–1.00)
GFR, Estimated: 13 mL/min — ABNORMAL LOW (ref >60)
Glucose, Bld: 159 mg/dL — ABNORMAL HIGH (ref 70–99)
Phosphorus: 6 mg/dL — ABNORMAL HIGH (ref 2.5–4.6)
Potassium: 3.6 mmol/L (ref 3.5–5.1)
Sodium: 139 mmol/L (ref 135–145)

## 2020-10-12 LAB — GLUCOSE, CAPILLARY
Glucose-Capillary: 167 mg/dL — ABNORMAL HIGH (ref 70–99)
Glucose-Capillary: 242 mg/dL — ABNORMAL HIGH (ref 70–99)
Glucose-Capillary: 99 mg/dL (ref 70–99)

## 2020-10-12 MED ORDER — SODIUM BICARBONATE 650 MG PO TABS
1300.0000 mg | ORAL_TABLET | Freq: Three times a day (TID) | ORAL | 1 refills | Status: DC
Start: 2020-10-12 — End: 2020-11-12

## 2020-10-12 MED ORDER — CEFDINIR 300 MG PO CAPS: 300.0000 mg | ORAL_CAPSULE | Freq: Two times a day (BID) | ORAL | 0 refills | Status: AC

## 2020-10-12 MED ORDER — VALACYCLOVIR HCL 500 MG PO TABS
500.0000 mg | ORAL_TABLET | ORAL | Status: DC
Start: 2020-10-13 — End: 2020-10-21

## 2020-10-12 MED ORDER — AZITHROMYCIN 500 MG PO TABS: 500.0000 mg | ORAL_TABLET | Freq: Every day | ORAL | 0 refills | Status: AC

## 2020-10-12 MED ORDER — TORSEMIDE 10 MG PO TABS: 20.0000 mg | ORAL_TABLET | Freq: Every day | ORAL | 0 refills | Status: DC

## 2020-10-12 MED ORDER — TORSEMIDE 20 MG PO TABS
20.0000 mg | ORAL_TABLET | Freq: Every day | ORAL | Status: DC
  Administered 2020-10-12: 20 mg via ORAL
  Filled 2020-10-12: qty 1

## 2020-10-12 MED ORDER — SODIUM BICARBONATE 650 MG PO TABS
1300.0000 mg | ORAL_TABLET | Freq: Three times a day (TID) | ORAL | Status: DC
  Administered 2020-10-12: 1300 mg via ORAL
  Filled 2020-10-12 (×3): qty 2

## 2020-10-12 MED ORDER — TORSEMIDE 20 MG PO TABS: 10.0000 mg | ORAL_TABLET | Freq: Every day | ORAL | Status: DC

## 2020-10-12 MED ORDER — INSULIN ASPART PROT & ASPART (70-30 MIX) 100 UNIT/ML ~~LOC~~ SUSP
35.0000 [IU] | Freq: Two times a day (BID) | SUBCUTANEOUS | Status: DC
  Administered 2020-10-12: 35 [IU] via SUBCUTANEOUS
  Filled 2020-10-12: qty 10

## 2020-10-12 NOTE — Progress Notes (Signed)
Patient walked to the bathroom and back to the bed without oxygen. Dyspenic with exertion, but O2 sats remaind in the high 90's on RA.  Dr. Reesa Chew made aware.

## 2020-10-12 NOTE — Progress Notes (Signed)
Pt is being discharged home.  Discharge papers given and explained to pt. Pt verbalized understanding. Meds and f/u appointments reviewed. Awaiting transportation. 

## 2020-10-12 NOTE — Discharge Summary (Signed)
Physician Discharge Summary  Terry Arroyo SJG:[id number] DOB: 05-01-1961 DOA: 10/10/2020  PCP: Kirk Ruths, MD  Admit date: 10/10/2020 Discharge date: 10/12/2020  Admitted From: Home Disposition: Home   Recommendations for Outpatient Follow-up:  1. Follow up with PCP in 1-2 weeks 2. Please obtain BMP/CBC in one week 3. Please follow up on the following pending results:None  Home Health:No Equipment/Devices:Cane Discharge Condition: Stable CODE STATUS: Full Diet recommendation: Heart Healthy / Carb Modified   Brief/Interim Summary: ShirleyBrownis a59 y.o.femalewith a known history of CHF, type 2 diabetes mellitus, hypertension, dyslipidemia and coronary artery disease, who presented to the emergency room with acute onset of worsening dyspnea with associated coughproductive of whitish sputumand wheezingover the last 3 weeks with significant respiratory distress on the day of admission. She was admitted for COPD exacerbation, initially requiring BiPAP, transition to nasal cannula and eventually weaned off from oxygen.Marland Kitchen  COVID-19 negative, BNP at 1251. Chest x-ray concerning for a new infiltrate so she received cefepime and Zithromax in ED and continue with ceftriaxone and Zithromax while in the hospital and discharged on cefdinir and Zithromax to complete a 5-day course. Urine cultures with multiple species but she was already on antibiotics which can take care of UTI if there is any concern.  She also received IV Lasix which was discontinued with an increase in her creatinine.  Her home dose of torsemide was increased from 10 mg daily to 20 mg daily and she was advised to follow-up with her nephrologist closely.  Patient has an history of HFrEF with EF of 30 to 35% on echo done in March 2021.  There was both carvedilol and metoprolol listed in her chart.  Palpation metoprolol was discontinued by her cardiologist.  We discontinued the metoprolol from her chart and she  will continue with carvedilol.  She was also started on bicarb supplement due to metabolic acidosis secondary to renal disease.  Lot of eye drops were listed in her chart and according to patient she is not using them anymore.  They were discontinued and she can follow-up with her ophthalmologist for further recommendations.  Patient will continue rest of her home meds and follow-up with her providers.  Discharge Diagnoses:  Active Problems:   Hypertension complicating diabetes ([place])   COPD exacerbation [facility])   Discharge Instructions  Discharge Instructions    Diet - low sodium heart healthy   Complete by: As directed    Discharge instructions   Complete by: As directed    It was pleasure taking care of you. We made some changes to your medication which include continue with carvedilol and stop taking metoprolol. We increased the dose of torsemide from 10mg  to 20 mg daily. We also added bicarb tablets as your body was becoming little acidotic due to your kidney disease. We adjusted the dose of your Valtrex according to your kidney functions, now you will take it 1 pill every other day instead of twice daily. Please follow-up with your kidney doctor and heart failure clinic closely. You are also being provided for antibiotics for 3 more days.   Increase activity slowly   Complete by: As directed      Allergies as of 10/12/2020      Reactions   Hydralazine Shortness Of Breath, Swelling   Body aches   Lisinopril Swelling   Shellfish Allergy Anaphylaxis   Shrimp/lobster  Shrimp/lobster    Kiwi Extract Swelling   Metformin Other (See Comments)   Other reaction(s): GI Intolerance   Tape Itching  Use paper tape whenever possible      Medication List    STOP taking these medications   atropine 1 % ophthalmic solution   brimonidine 0.2 % ophthalmic solution Commonly known as: ALPHAGAN   Fluticasone-Salmeterol 250-50 MCG/DOSE Aepb Commonly known as: ADVAIR    furosemide 40 MG tablet Commonly known as: LASIX   ketorolac 0.5 % ophthalmic solution Commonly known as: ACULAR   metoprolol succinate 50 MG 24 hr tablet Commonly known as: TOPROL-XL   ofloxacin 0.3 % ophthalmic solution Commonly known as: OCUFLOX   pantoprazole 40 MG tablet Commonly known as: PROTONIX   prednisoLONE acetate 1 % ophthalmic suspension Commonly known as: PRED FORTE     TAKE these medications   albuterol 108 (90 Base) MCG/ACT inhaler Commonly known as: VENTOLIN HFA Inhale 1-2 puffs into the lungs every 6 (six) hours as needed for wheezing or shortness of breath.   amLODipine 10 MG tablet Commonly known as: NORVASC Take 1 tablet (10 mg total) by mouth daily.   ammonium lactate 12 % lotion Commonly known as: LAC-HYDRIN Apply 1 application topically as needed for dry skin.   aspirin 81 MG EC tablet Take 1 tablet (81 mg total) by mouth daily. Do not take this medication for 1 week and then restart.   atorvastatin 80 MG tablet Commonly known as: LIPITOR Take 1 tablet (80 mg total) by mouth daily at 6 PM.   azithromycin 500 MG tablet Commonly known as: ZITHROMAX Take 1 tablet (500 mg total) by mouth daily for 3 days.   carvedilol 25 MG tablet Commonly known as: COREG Take 25 mg by mouth 2 (two) times daily with a meal.   cefdinir 300 MG capsule Commonly known as: OMNICEF Take 1 capsule (300 mg total) by mouth 2 (two) times daily for 4 days.   cyanocobalamin 1000 MCG tablet Take 1,000 mcg by mouth daily.   cyclobenzaprine 5 MG tablet Commonly known as: FLEXERIL Take 5 mg by mouth 2 (two) times daily as needed for muscle spasms.   fenofibrate 145 MG tablet Commonly known as: TRICOR Take 145 mg by mouth daily.   GLYCERIN-HYPROMELLOSE-PEG 400 OP Place 1 drop into both eyes 4 (four) times daily.   insulin aspart protamine- aspart (70-30) 100 UNIT/ML injection Commonly known as: NOVOLOG MIX 70/30 Inject 0.4 mLs (40 Units total) into the skin 2  (two) times daily with a meal. What changed: how much to take   isosorbide mononitrate 30 MG 24 hr tablet Commonly known as: IMDUR Take 30 mg by mouth daily.   mirtazapine 30 MG tablet Commonly known as: REMERON Take 1 tablet (30 mg total) by mouth at bedtime.   mometasone-formoterol 200-5 MCG/ACT Aero Commonly known as: DULERA Inhale 2 puffs into the lungs 2 (two) times daily.   omeprazole 20 MG capsule Commonly known as: PRILOSEC Take 20 mg by mouth daily.   sodium bicarbonate 650 MG tablet Take 2 tablets (1,300 mg total) by mouth 3 (three) times daily.   topiramate 25 MG tablet Commonly known as: TOPAMAX Take 25 mg by mouth daily.   torsemide 10 MG tablet Commonly known as: DEMADEX Take 2 tablets (20 mg total) by mouth daily. What changed: how much to take   valACYclovir 500 MG tablet Commonly known as: VALTREX Take 1 tablet (500 mg total) by mouth every other day. Start taking on: October 13, 2020 What changed: when to take this            [place]  (  From admission, onward)         Start     Ordered   10/12/20 1138  For home use only DME Cane  Once        10/12/20 1137          Follow-up Information    Kirk Ruths, MD. Schedule an appointment as soon as possible for a visit.   Specialty: Internal Medicine Contact information: [place] [address] [phone]        [facility] Follow up on 10/15/2020.   Specialty: Cardiology Why: at 12:00pm. Enter through the [facility] entrance Contact information: [place]             Allergies  Allergen Reactions  . Hydralazine Shortness Of Breath and Swelling    Body aches  . Lisinopril Swelling  . Shellfish Allergy Anaphylaxis    Shrimp/lobster  Shrimp/lobster    . Kiwi Extract Swelling  . Metformin Other  (See Comments)    Other reaction(s): GI Intolerance  . Tape Itching    Use paper tape whenever possible    Consultations:  None  Procedures/Studies: CT ABDOMEN PELVIS WO CONTRAST  Result Date: 09/12/2020 CLINICAL DATA:  History of recent right kidney biopsy. Patient with flank pain and hematuria. EXAM: CT ABDOMEN AND PELVIS WITHOUT CONTRAST TECHNIQUE: Multidetector CT imaging of the abdomen and pelvis was performed following the standard protocol without IV contrast. COMPARISON:  03/01/2020 FINDINGS: Lower chest: The lung bases are clear of an acute process. No pleural effusion or pulmonary lesions. Stable age advanced aortic and coronary artery calcifications and evidence of prior coronary artery bypass surgery. No pericardial effusion. The esophagus is grossly normal. Hepatobiliary: No hepatic lesions or intrahepatic biliary dilatation. The gallbladder appears normal. Pancreas: No mass, inflammation or ductal dilatation. Spleen: Normal size.  No focal lesions. Adrenals/Urinary Tract: The adrenal glands are unremarkable and stable. Stable bilateral hydronephrosis and slightly prominent extra renal pelves. No significant hydroureter. Small area of high attenuation in the right collecting system could be a small amount of blood/hematoma. No renal or ureteral calculi. Left renal artery calcifications are noted. There is a small right-sided perinephric hematoma and perinephric air both of which are not unexpected. The bladder is unremarkable.  No bladder calculi, mass or hematoma. Stomach/Bowel: The stomach, duodenum, small bowel and colon are grossly normal without oral contrast. No acute inflammatory changes, mass lesions or obstructive findings. The terminal ileum and appendix are normal. Vascular/Lymphatic: Age advanced atherosclerotic calcifications involving the aorta and branch vessels. No aneurysm. Stable borderline enlarged retroperitoneal lymph nodes. Reproductive: The uterus and ovaries are  unremarkable. Other: No pelvic mass or adenopathy. No free pelvic fluid collections. No inguinal mass or adenopathy. No abdominal wall hernia or subcutaneous lesions. Musculoskeletal: No significant bony findings. IMPRESSION: 1. Stable bilateral hydronephrosis and slightly prominent extra renal pelves. No significant hydroureter. Small area of high attenuation in the right collecting system could be a small amount of blood/hematoma. 2. Small right-sided perinephric hematoma and perinephric air both of which are not unexpected. No large retroperitoneal hematoma. 3. No renal or ureteral calculi. 4. Age advanced atherosclerotic calcifications involving the aorta and branch vessels. 5. Aortic atherosclerosis. Aortic Atherosclerosis (ICD10-I70.0). Electronically Signed   By: Marijo Sanes M.D.   On: 09/12/2020 18:00   DG Chest 2 View  Result Date: 10/10/2020 CLINICAL DATA:  Dyspnea EXAM: CHEST -  2 VIEW COMPARISON:  02/26/2020 FINDINGS: The lungs are symmetrically well expanded. Tiny right pleural effusion has developed. Multifocal nodular infiltrate has developed within the left lung, likely infectious or inflammatory in nature. Minimal infiltrate may be present at the right lung base. No pneumothorax. Coronary artery bypass grafting has been performed. Cardiac size within normal limits. No acute bone abnormality. IMPRESSION: Interval development of nodular asymmetric pulmonary infiltrate, likely infectious or inflammatory. Electronically Signed   By: Fidela Salisbury MD   On: 10/10/2020 12:23   US Abdomen Complete  Result Date: 09/12/2020 CLINICAL DATA:  Ultrasound guided right renal biopsy yesterday. Diffuse abdominal and right flank pain. EXAM: ABDOMEN ULTRASOUND COMPLETE ABDOMEN PELVIS LIMITED COMPARISON:  Noncontrast abdominal CT 03/01/2020 FINDINGS: Abdomen: Gallbladder: Physiologically distended. No gallstones or wall thickening visualized. No sonographic Murphy sign noted by sonographer. Common bile duct:  Diameter: 3 mm, normal. Liver: No focal lesion identified. Heterogeneously increased in parenchymal echogenicity. No perihepatic fluid collection or sonographic evidence of hepatic injury. Portal vein is patent on color Doppler imaging with normal direction of blood flow towards the liver. IVC: No abnormality visualized. Pancreas: Visualized portion unremarkable. No peripancreatic collection visualized by ultrasound. Spleen: Size and appearance within normal limits. Right Kidney: Length: 10.3 cm. No evidence of perinephric hematoma post recent biopsy. Slight prominence of the renal calices without renal pelvis dilatation. No focal renal abnormality. Left Kidney: Length: 10.8 cm. Mild calyceal dilatation without renal pelvis distension or frank hydronephrosis. No focal renal abnormality. Abdominal aorta: No aneurysm visualized.  Atherosclerotic plaque. Other findings: No visualized free fluid or ascites. Pelvis: Bladder: Physiologically distended without wall thickening or intraluminal debris. Other findings: No pelvic ascites. IMPRESSION: 1. No sonographic evidence of complication post right renal biopsy yesterday. No evidence perinephric hematoma. If there is persistent clinical concern for post biopsy injury, recommend further evaluation with CT. 2. No evidence of peripancreatic inflammation by ultrasound. 3. No abdominal or pelvic ascites. 4. No debris in the urinary bladder. Electronically Signed   By: Keith Rake M.D.   On: 09/12/2020 15:59   US PELVIS LIMITED (TRANSABDOMINAL ONLY)  Result Date: 09/12/2020 CLINICAL DATA:  Ultrasound guided right renal biopsy yesterday. Diffuse abdominal and right flank pain. EXAM: ABDOMEN ULTRASOUND COMPLETE ABDOMEN PELVIS LIMITED COMPARISON:  Noncontrast abdominal CT 03/01/2020 FINDINGS: Abdomen: Gallbladder: Physiologically distended. No gallstones or wall thickening visualized. No sonographic Murphy sign noted by sonographer. Common bile duct: Diameter: 3 mm,  normal. Liver: No focal lesion identified. Heterogeneously increased in parenchymal echogenicity. No perihepatic fluid collection or sonographic evidence of hepatic injury. Portal vein is patent on color Doppler imaging with normal direction of blood flow towards the liver. IVC: No abnormality visualized. Pancreas: Visualized portion unremarkable. No peripancreatic collection visualized by ultrasound. Spleen: Size and appearance within normal limits. Right Kidney: Length: 10.3 cm. No evidence of perinephric hematoma post recent biopsy. Slight prominence of the renal calices without renal pelvis dilatation. No focal renal abnormality. Left Kidney: Length: 10.8 cm. Mild calyceal dilatation without renal pelvis distension or frank hydronephrosis. No focal renal abnormality. Abdominal aorta: No aneurysm visualized.  Atherosclerotic plaque. Other findings: No visualized free fluid or ascites. Pelvis: Bladder: Physiologically distended without wall thickening or intraluminal debris. Other findings: No pelvic ascites. IMPRESSION: 1. No sonographic evidence of complication post right renal biopsy yesterday. No evidence perinephric hematoma. If there is persistent clinical concern for post biopsy injury, recommend further evaluation with CT. 2. No evidence of peripancreatic inflammation by ultrasound. 3. No abdominal or pelvic ascites. 4. No debris in  the urinary bladder. Electronically Signed   By: Keith Rake M.D.   On: 09/12/2020 15:59     Subjective: Patient was feeling better when seen today.  Appears back to her baseline.  No desaturation with ambulation but did become little dyspneic.  Discharge Exam: Vitals:   10/12/20 0723 10/12/20 1240  BP: (!) 153/86 (!) 148/82  Pulse: 67 65  Resp: 16 15  Temp: 98.2 F (36.8 C) 98.3 F (36.8 C)  SpO2: 99% 100%   Vitals:   10/12/20 0437 10/12/20 0500 10/12/20 0723 10/12/20 1240  BP: 133/71  (!) 153/86 (!) 148/82  Pulse: 62  67 65  Resp: 18  16 15   Temp:  97.9 F (36.6 C)  98.2 F (36.8 C) 98.3 F (36.8 C)  TempSrc: Oral  Oral Oral  SpO2: 99%  99% 100%  Weight:  91 kg    Height:        General: Pt is alert, awake, not in acute distress Cardiovascular: RRR, S1/S2 +, no rubs, no gallops Respiratory: CTA bilaterally, no wheezing, no rhonchi Abdominal: Soft, NT, ND, bowel sounds + Extremities: 1+ LE edema, no cyanosis   The results of significant diagnostics from this hospitalization (including imaging, microbiology, ancillary and laboratory) are listed below for reference.    Microbiology: Recent Results (from the past 240 hour(s))  Respiratory Panel by RT PCR (Flu A&B, Covid) - Nasopharyngeal Swab     Status: None   Collection Time: 10/10/20  6:47 PM   Specimen: Nasopharyngeal Swab  Result Value Ref Range Status   SARS Coronavirus 2 by RT PCR NEGATIVE NEGATIVE Final    Comment: (NOTE) SARS-CoV-2 target nucleic acids are NOT DETECTED.  The SARS-CoV-2 RNA is generally detectable in upper respiratoy specimens during the acute phase of infection. The lowest concentration of SARS-CoV-2 viral copies this assay can detect is 131 copies/mL. A negative result does not preclude SARS-Cov-2 infection and should not be used as the sole basis for treatment or other patient management decisions. A negative result may occur with  improper specimen collection/handling, submission of specimen other than nasopharyngeal swab, presence of viral mutation(s) within the areas targeted by this assay, and inadequate number of viral copies (<131 copies/mL). A negative result must be combined with clinical observations, patient history, and epidemiological information. The expected result is Negative.  Fact Sheet for Patients:  PinkCheek.be  Fact Sheet for Healthcare Providers:  GravelBags.it  This test is no t yet approved or cleared by the [place] FDA and  has been authorized for  detection and/or diagnosis of SARS-CoV-2 by FDA under an Emergency Use Authorization (EUA). This EUA will remain  in effect (meaning this test can be used) for the duration of the COVID-19 declaration under Section 564(b)(1) of the Act, 21 U.S.C. section 360bbb-3(b)(1), unless the authorization is terminated or revoked sooner.     Influenza A by PCR NEGATIVE NEGATIVE Final   Influenza B by PCR NEGATIVE NEGATIVE Final    Comment: (NOTE) The Xpert Xpress SARS-CoV-2/FLU/RSV assay is intended as an aid in  the diagnosis of influenza from Nasopharyngeal swab specimens and  should not be used as a sole basis for treatment. Nasal washings and  aspirates are unacceptable for Xpert Xpress SARS-CoV-2/FLU/RSV  testing.  Fact Sheet for Patients: PinkCheek.be  Fact Sheet for Healthcare Providers: GravelBags.it  This test is not yet approved or cleared by the [place] FDA and  has been authorized for detection and/or diagnosis of SARS-CoV-2 by  FDA under an  Emergency Use Authorization (EUA). This EUA will remain  in effect (meaning this test can be used) for the duration of the  Covid-19 declaration under Section 564(b)(1) of the Act, 21  U.S.C. section 360bbb-3(b)(1), unless the authorization is  terminated or revoked. Performed at [facility], [address] [place], [place] [address]   Blood culture (routine x 2)     Status: None (Preliminary result)   Collection Time: 10/10/20  7:34 PM   Specimen: BLOOD  Result Value Ref Range Status   Specimen Description BLOOD LEFT ANTECUBITAL  Final   Special Requests   Final    BOTTLES DRAWN AEROBIC AND ANAEROBIC Blood Culture adequate volume   Culture   Final    NO GROWTH 2 DAYS Performed at [facility], [address] [place], [place] [address]    Report Status PENDING  Incomplete  Blood culture (routine x 2)     Status: None (Preliminary result)    Collection Time: 10/10/20  7:35 PM   Specimen: BLOOD  Result Value Ref Range Status   Specimen Description BLOOD BLOOD RIGHT ARM  Final   Special Requests   Final    BOTTLES DRAWN AEROBIC AND ANAEROBIC Blood Culture adequate volume   Culture   Final    NO GROWTH 2 DAYS Performed at [facility], [address] [place], [place] [address]    Report Status PENDING  Incomplete  Culture, sputum-assessment     Status: None   Collection Time: 10/11/20 12:52 AM   Specimen: Sputum  Result Value Ref Range Status   Specimen Description SPUTUM  Final   Special Requests NONE  Final   Sputum evaluation   Final    Sputum specimen not acceptable for testing.  Please recollect.   NOTIFIED [facility] 10/11/20 AT 0205 HS Performed at [facility], [address] [place], [place] [address]    Report Status 10/11/2020 FINAL  Final     Labs: BNP (last 3 results) Recent Labs    02/26/20 1302 10/10/20 1134  BNP 610.0* 4,580.9*   Basic Metabolic Panel: Recent Labs  Lab 10/10/20 1134 10/11/20 0343 10/12/20 0449  NA 143 142 139  K 3.9 4.0 3.6  CL 112* 112* 111  CO2 21* 20* 17*  GLUCOSE 238* 275* 159*  BUN 39* 41* 57*  CREATININE 3.86* 3.79* 3.87*  CALCIUM 8.2* 8.4* 8.1*  PHOS  --   --  6.0*   Liver Function Tests: Recent Labs  Lab 10/12/20 0449  ALBUMIN 2.0*   No results for input(s): LIPASE, AMYLASE in the last 168 hours. No results for input(s): AMMONIA in the last 168 hours. CBC: Recent Labs  Lab 10/10/20 1134 10/11/20 0343 10/12/20 0449  WBC 11.8* 12.0* 13.3*  HGB 9.6* 10.4* 9.1*  HCT 29.7* 31.6* 28.4*  MCV 91.1 90.0 91.0  PLT 324 353 301   Cardiac Enzymes: No results for input(s): CKTOTAL, CKMB, CKMBINDEX, TROPONINI in the last 168 hours. BNP: Invalid input(s): POCBNP CBG: Recent Labs  Lab 10/11/20 2254 10/11/20 2343 10/12/20 0026 10/12/20 0900 10/12/20 1243  GLUCAP 65* 56* 99 242* 167*   D-Dimer No results for input(s): DDIMER  in the last 72 hours. Hgb A1c No results for input(s): HGBA1C in the last 72 hours. Lipid Profile No results for input(s): CHOL, HDL, LDLCALC, TRIG, CHOLHDL, LDLDIRECT in the last 72 hours. Thyroid function studies No results for input(s): TSH, T4TOTAL, T3FREE, THYROIDAB in the last 72 hours.  Invalid input(s): FREET3 Anemia  work up No results for input(s): VITAMINB12, FOLATE, FERRITIN, TIBC, IRON, RETICCTPCT in the last 72 hours. Urinalysis    Component Value Date/Time   COLORURINE RED (A) 09/12/2020 0945   APPEARANCEUR CLOUDY (A) 09/12/2020 0945   LABSPEC 1.011 09/12/2020 0945   PHURINE  09/12/2020 0945    TEST NOT REPORTED DUE TO COLOR INTERFERENCE OF URINE PIGMENT   GLUCOSEU (A) 09/12/2020 0945    TEST NOT REPORTED DUE TO COLOR INTERFERENCE OF URINE PIGMENT   HGBUR (A) 09/12/2020 0945    TEST NOT REPORTED DUE TO COLOR INTERFERENCE OF URINE PIGMENT   BILIRUBINUR (A) 09/12/2020 0945    TEST NOT REPORTED DUE TO COLOR INTERFERENCE OF URINE PIGMENT   KETONESUR (A) 09/12/2020 0945    TEST NOT REPORTED DUE TO COLOR INTERFERENCE OF URINE PIGMENT   PROTEINUR (A) 09/12/2020 0945    TEST NOT REPORTED DUE TO COLOR INTERFERENCE OF URINE PIGMENT   NITRITE (A) 09/12/2020 0945    TEST NOT REPORTED DUE TO COLOR INTERFERENCE OF URINE PIGMENT   LEUKOCYTESUR (A) 09/12/2020 0945    TEST NOT REPORTED DUE TO COLOR INTERFERENCE OF URINE PIGMENT   Sepsis Labs Invalid input(s): PROCALCITONIN,  WBC,  LACTICIDVEN Microbiology Recent Results (from the past 240 hour(s))  Respiratory Panel by RT PCR (Flu A&B, Covid) - Nasopharyngeal Swab     Status: None   Collection Time: 10/10/20  6:47 PM   Specimen: Nasopharyngeal Swab  Result Value Ref Range Status   SARS Coronavirus 2 by RT PCR NEGATIVE NEGATIVE Final    Comment: (NOTE) SARS-CoV-2 target nucleic acids are NOT DETECTED.  The SARS-CoV-2 RNA is generally detectable in upper respiratoy specimens during the acute phase of infection. The  lowest concentration of SARS-CoV-2 viral copies this assay can detect is 131 copies/mL. A negative result does not preclude SARS-Cov-2 infection and should not be used as the sole basis for treatment or other patient management decisions. A negative result may occur with  improper specimen collection/handling, submission of specimen other than nasopharyngeal swab, presence of viral mutation(s) within the areas targeted by this assay, and inadequate number of viral copies (<131 copies/mL). A negative result must be combined with clinical observations, patient history, and epidemiological information. The expected result is Negative.  Fact Sheet for Patients:  PinkCheek.be  Fact Sheet for Healthcare Providers:  GravelBags.it  This test is no t yet approved or cleared by the [place] FDA and  has been authorized for detection and/or diagnosis of SARS-CoV-2 by FDA under an Emergency Use Authorization (EUA). This EUA will remain  in effect (meaning this test can be used) for the duration of the COVID-19 declaration under Section 564(b)(1) of the Act, 21 U.S.C. section 360bbb-3(b)(1), unless the authorization is terminated or revoked sooner.     Influenza A by PCR NEGATIVE NEGATIVE Final   Influenza B by PCR NEGATIVE NEGATIVE Final    Comment: (NOTE) The Xpert Xpress SARS-CoV-2/FLU/RSV assay is intended as an aid in  the diagnosis of influenza from Nasopharyngeal swab specimens and  should not be used as a sole basis for treatment. Nasal washings and  aspirates are unacceptable for Xpert Xpress SARS-CoV-2/FLU/RSV  testing.  Fact Sheet for Patients: PinkCheek.be  Fact Sheet for Healthcare Providers: GravelBags.it  This test is not yet approved or cleared by the [place] FDA and  has been authorized for detection and/or diagnosis of SARS-CoV-2 by  FDA under  an Emergency Use Authorization (EUA). This EUA will remain  in effect (meaning this  test can be used) for the duration of the  Covid-19 declaration under Section 564(b)(1) of the Act, 21  U.S.C. section 360bbb-3(b)(1), unless the authorization is  terminated or revoked. Performed at [facility], [address] [place], [place] [address]   Blood culture (routine x 2)     Status: None (Preliminary result)   Collection Time: 10/10/20  7:34 PM   Specimen: BLOOD  Result Value Ref Range Status   Specimen Description BLOOD LEFT ANTECUBITAL  Final   Special Requests   Final    BOTTLES DRAWN AEROBIC AND ANAEROBIC Blood Culture adequate volume   Culture   Final    NO GROWTH 2 DAYS Performed at [facility], [address], [place], [place] [address]    Report Status PENDING  Incomplete  Blood culture (routine x 2)     Status: None (Preliminary result)   Collection Time: 10/10/20  7:35 PM   Specimen: BLOOD  Result Value Ref Range Status   Specimen Description BLOOD BLOOD RIGHT ARM  Final   Special Requests   Final    BOTTLES DRAWN AEROBIC AND ANAEROBIC Blood Culture adequate volume   Culture   Final    NO GROWTH 2 DAYS Performed at [facility], [address], [place], [place] [address]    Report Status PENDING  Incomplete  Culture, sputum-assessment     Status: None   Collection Time: 10/11/20 12:52 AM   Specimen: Sputum  Result Value Ref Range Status   Specimen Description SPUTUM  Final   Special Requests NONE  Final   Sputum evaluation   Final    Sputum specimen not acceptable for testing.  Please recollect.   NOTIFIED Susette Racer 10/11/20 AT 0205 HS Performed at [facility], [address] [place],  [address]    Report Status 10/11/2020 FINAL  Final    Time coordinating discharge: Over 30 minutes  SIGNED:  Lorella Nimrod, MD  [facility] 10/12/2020, 1:09 PM  If 7PM-7AM, please contact  night-coverage [email or web address]  This record has been created using Systems analyst. Errors have been sought and corrected,but may not always be located. Such creation errors do not reflect on the standard of care.

## 2020-10-12 NOTE — Progress Notes (Signed)
Pt with CBG of 65 @ 2254. Juice provided per protocol. CBG result of 56 @ 2343. Soda provided per protocol. Pt also provided crackers with peanut butter. CBG rechecked 0026 with result of 99.

## 2020-10-12 NOTE — TOC Transition Note (Signed)
Transition of Care [facility]) - CM/SW Discharge Note   Patient Details  Name: Terry Arroyo MRN: [id number] Date of Birth: [birth year]  Transition [facility]) CM/SW Contact:  Elliot Gurney [place], [place] Phone Number: [phone] 10/12/2020, 1:20 PM   Clinical Narrative:    Patient discussed need for a ride home as father and sister are not able to pick her up today. Patient now has cain, and will be discharged home by [employer] taxi.  Jarvis Knodel, LCSW Transition of Care [phone]    Final next level of care: Home/Self Care Barriers to Discharge: No Barriers Identified   Patient Goals and CMS Choice Patient states their goals for this hospitalization and ongoing recovery are:: "I want to get my health back"      Discharge Placement                       Discharge Plan and Services In-house Referral: Clinical Social Work Discharge Planning Services: NA              DME Agency: NA                  Social Determinants of Health (SDOH) Interventions     Readmission Risk Interventions Readmission Risk Prevention Plan 10/12/2020  Transportation Screening Complete  Medication Review Press photographer) Complete  PCP or Specialist appointment within 3-5 days of discharge Complete  SW Recovery Care/Counseling Consult Complete  [place] Not Complete  SNF Comments Patient pending evaluation  Some recent data might be hidden

## 2020-10-12 NOTE — TOC Initial Note (Addendum)
Transition of Care [facility]) - Initial/Assessment Note    Patient Details  Name: Terry Arroyo MRN: [id number] Date of Birth: 26-Aug-1961  Transition of Care [facility]) CM/SW Contact:    Elliot Gurney [place], [place] Phone [phone] 10/12/2020, 10:51 AM  Clinical Narrative:                 Patient is a 59 year old female admitted with COPD exacerbation,  Patient resides in [facility] and has lived there for the past 3 monts. Per patient she is followed by Dr. Frazier Richards and has an appointment coming up , she believes on 10/30/20. Patient also has an appointment with Darylene Price FNP on 10/15/20. Patient uses  [employer] for her transportation needs.  Patient recieves her medications through [facility] service.  Patient states having no services in the home, however is currently on the waiting list for CAP services. Patiemt describes  family support which includes her father, sister and nephews. Patient states that she would like a new cain, as the last one she had is now broken.  Order for a new cain will be requested bu the doctor. Transition of Care to continue to follow  [facility], LCSW Transition of Care [phone]   Expected Discharge Plan:  (pending) Barriers to Discharge: No Barriers Identified   Patient Goals and CMS Choice Patient states their goals for this hospitalization and ongoing recovery are:: "I want to get my health back"      Expected Discharge Plan and Services Expected Discharge Plan:  (pending) In-house Referral: Clinical Social Work Discharge Planning Services: NA   Living arrangements for the past 2 months: [place] ([place])                   DME Agency: NA                  Prior Living Arrangements/Services Living arrangements for the past 2 months: [place] ([place]) Lives with:: Self Patient language and need for interpreter reviewed:: Yes Do you feel  safe going back to the place where you live?: Yes      Need for Family Participation in Patient Care: No (Comment)   Current home services:  (On waiting list for CAP services) Criminal Activity/Legal Involvement Pertinent to Current Situation/Hospitalization: No - Comment as needed  Activities of Daily Living Home Assistive Devices/Equipment: Cane (specify quad or straight) (striaght) ADL Screening (condition at time of admission) Patient's cognitive ability adequate to safely complete daily activities?: Yes Is the patient deaf or have difficulty hearing?: No Does the patient have difficulty seeing, even when wearing glasses/contacts?: Yes Does the patient have difficulty concentrating, remembering, or making decisions?: Yes Patient able to express need for assistance with ADLs?: Yes Does the patient have difficulty dressing or bathing?: Yes Independently performs ADLs?: No Communication: Independent Dressing (OT): Needs assistance Is this a change from baseline?: Pre-admission baseline Grooming: Needs assistance Is this a change from baseline?: Pre-admission baseline Feeding: Independent Bathing: Independent Toileting: Independent In/Out Bed: Independent Walks in Home: Independent with device (comment) Does the patient have difficulty walking or climbing stairs?: No Weakness of Legs: Left Weakness of Arms/Hands: Left  Permission Sought/Granted      Share Information with NAME: Ronie Spies     Permission granted to share info w Relationship: Father  Permission granted to share info w Contact Information: [phone]  Emotional Assessment Appearance:: Appears younger than stated age Attitude/Demeanor/Rapport: Engaged Affect (typically observed): Accepting, Adaptable  Orientation: : Oriented to Self, Oriented to Place, Oriented to  Time, Oriented to Situation Alcohol / Substance Use: Not Applicable Psych Involvement: No (comment)  Admission diagnosis:  Acute respiratory  distress [R06.03] COPD exacerbation ([place]) [J44.1] Patient Active Problem List   Diagnosis Date Noted  . HFrEF (heart failure with reduced ejection fraction) ([place])   . Acute respiratory distress   . COPD exacerbation ([place]) 10/10/2020  . Kidney hematoma 09/13/2020  . Hypertension complicating diabetes ([place])   . AKI (acute kidney injury) ([place])   . Hematuria 09/12/2020  . Calf tenderness   . Acute on chronic heart failure ([place]) 02/27/2020  . Leg edema   . DM2 (diabetes mellitus, type 2) ([place]) 02/26/2020  . CKD (chronic kidney disease), stage III ([place]) 02/26/2020  . Hyponatremia 02/26/2020  . Anasarca 02/26/2020  . Proteinuria 02/26/2020  . Hypoalbuminemia 02/26/2020  . Elevated troponin 02/26/2020  . Acute on chronic systolic CHF (congestive heart failure) ([place]) 02/26/2020  . Hyperglycemia   . Nausea   . Hypertensive emergency   . Hypertensive urgency 11/24/2019  . Nonspecific chest pain 11/23/2019   PCP:  Kirk Ruths, MD Pharmacy:   [facility] [address], [place] - [place] [address] [place] [address] Phone: [phone] Fax: [facility] [place], [place] [address] Phone: [phone] Fax: [phone]     Social Determinants of Health (SDOH) Interventions    Readmission Risk Interventions Readmission Risk Prevention Plan 10/12/2020  Transportation Screening Complete  Medication Review ([place]) Complete  PCP or Specialist appointment within 3-5 days of discharge Complete  SW Recovery Care/Counseling Consult Complete  [place] Not Complete  SNF Comments Patient pending evaluation  Some recent data might be hidden

## 2020-10-13 LAB — HEMOGLOBIN A1C
Hgb A1c MFr Bld: 8.6 % — ABNORMAL HIGH (ref 4.8–5.6)
Mean Plasma Glucose: 200 mg/dL

## 2020-10-14 ENCOUNTER — Telehealth (HOSPITAL_COMMUNITY): Payer: Self-pay

## 2020-10-14 NOTE — Telephone Encounter (Signed)
Attempted  To contact to set up home visit.  Left message, will try again.   [place] [phone]

## 2020-10-14 NOTE — Telephone Encounter (Signed)
Terry Arroyo contacted me back and advised she was ok.  She states she needs an aid to help her out.  She is living on her own.  She has apartment now and feels she needs some assistance doing things.  Advised will talk to her PCP to see if he can help.  She is aware of HF appt tomorrow and has transportation scheduled.  She states feeling better since hospital stay.  Set up home visit for next week.  Will visit for heart failure.   [place] [phone]

## 2020-10-15 ENCOUNTER — Other Ambulatory Visit: Payer: Self-pay

## 2020-10-15 ENCOUNTER — Ambulatory Visit: Payer: Medicare HMO | Attending: Family | Admitting: Family

## 2020-10-15 ENCOUNTER — Encounter: Payer: Self-pay | Admitting: Family

## 2020-10-15 VITALS — BP 142/79 | HR 65 | Resp 18 | Ht 66.0 in | Wt 188.1 lb

## 2020-10-15 DIAGNOSIS — E785 Hyperlipidemia, unspecified: Secondary | ICD-10-CM | POA: Insufficient documentation

## 2020-10-15 DIAGNOSIS — Z741 Need for assistance with personal care: Secondary | ICD-10-CM | POA: Insufficient documentation

## 2020-10-15 DIAGNOSIS — I110 Hypertensive heart disease with heart failure: Secondary | ICD-10-CM | POA: Insufficient documentation

## 2020-10-15 DIAGNOSIS — E119 Type 2 diabetes mellitus without complications: Secondary | ICD-10-CM | POA: Diagnosis not present

## 2020-10-15 DIAGNOSIS — Z87891 Personal history of nicotine dependence: Secondary | ICD-10-CM | POA: Diagnosis not present

## 2020-10-15 DIAGNOSIS — N184 Chronic kidney disease, stage 4 (severe): Secondary | ICD-10-CM

## 2020-10-15 DIAGNOSIS — I890 Lymphedema, not elsewhere classified: Secondary | ICD-10-CM | POA: Diagnosis not present

## 2020-10-15 DIAGNOSIS — Z7951 Long term (current) use of inhaled steroids: Secondary | ICD-10-CM | POA: Insufficient documentation

## 2020-10-15 DIAGNOSIS — I10 Essential (primary) hypertension: Secondary | ICD-10-CM

## 2020-10-15 DIAGNOSIS — F419 Anxiety disorder, unspecified: Secondary | ICD-10-CM | POA: Insufficient documentation

## 2020-10-15 DIAGNOSIS — E1122 Type 2 diabetes mellitus with diabetic chronic kidney disease: Secondary | ICD-10-CM

## 2020-10-15 DIAGNOSIS — Z794 Long term (current) use of insulin: Secondary | ICD-10-CM | POA: Insufficient documentation

## 2020-10-15 DIAGNOSIS — Z79899 Other long term (current) drug therapy: Secondary | ICD-10-CM | POA: Insufficient documentation

## 2020-10-15 DIAGNOSIS — Z8249 Family history of ischemic heart disease and other diseases of the circulatory system: Secondary | ICD-10-CM | POA: Insufficient documentation

## 2020-10-15 DIAGNOSIS — Z7982 Long term (current) use of aspirin: Secondary | ICD-10-CM | POA: Insufficient documentation

## 2020-10-15 DIAGNOSIS — I5022 Chronic systolic (congestive) heart failure: Secondary | ICD-10-CM | POA: Diagnosis not present

## 2020-10-15 DIAGNOSIS — I252 Old myocardial infarction: Secondary | ICD-10-CM | POA: Insufficient documentation

## 2020-10-15 LAB — CULTURE, BLOOD (ROUTINE X 2)
Culture: NO GROWTH
Culture: NO GROWTH
Special Requests: ADEQUATE
Special Requests: ADEQUATE

## 2020-10-15 NOTE — Patient Instructions (Signed)
Kristi the paramedic is working on getting you an aid and on getting you [employer] for food.  Follow up with PCP and Nephrology

## 2020-10-15 NOTE — Progress Notes (Signed)
Patient ID: Terry Arroyo, female    DOB: 1961/05/08, 59 y.o.   MRN: [id number]  Congestive Heart Failure Associated symptoms include chest pain, fatigue (easily) and shortness of breath. Pertinent negatives include no abdominal pain or palpitations.  Shortness of Breath Associated symptoms include chest pain, leg swelling and vomiting. Pertinent negatives include no abdominal pain, neck pain or sore throat.  Chest Pain  Associated symptoms include back pain, nausea (Reports nausea at certain smells), shortness of breath, vomiting and weakness. Pertinent negatives include no abdominal pain, cough, dizziness or palpitations.  Her past medical history is significant for CHF.    Terry Arroyo is a 59 y/o female with a history of DM, hyperlipidemia, HTN, brain tumor, left eye blindness, previous tobacco use and chronic heart failure.   Echo report from 02/27/20 reviewed and showed an EF of 30-35% along with mild MR.   Admitted 10/10/20 for Pneumonia and discharged 10/12/20. Admitted 09/12/20 for post-op complications following renal biopsy and discharged 09/15/20.  She presents today for a follow-up visit with a chief complaint of moderate shortness of breath upon minimal exertion. She describes this as chronic in nature having been present for several years. She has associated fatigue, light-headedness, weakness, pedal edema, nausea and chronic pain. She denies any difficulty sleeping, abdominal distention, palpitations, chest pain, cough or weight gain.   She says that she is no longer living with her dad and has been struggling living on her own. Patient reports she needs help with cooking, cleaning, and getting to her appointments. Patient recently was hospitalized for pneumonia but reports improvement.    Past Medical History:  Diagnosis Date  . Blind left eye   . Brain tumor ([place]) 1986  . CHF (congestive heart failure) ([place])   . Diabetes mellitus without complication ([place])   . Hyperlipidemia    . Hypertension   . MI (myocardial infarction) ([place]) 2018   Past Surgical History:  Procedure Laterality Date  . BRAIN SURGERY    . BREAST EXCISIONAL BIOPSY Left yrs ago   benign  . CESAREAN SECTION     Family History  Problem Relation Age of Onset  . CAD Mother   . CAD Brother   . Breast cancer Sister 24   Social History   Tobacco Use  . Smoking status: Former Research scientist (life sciences)  . Smokeless tobacco: Never Used  Substance Use Topics  . Alcohol use: Not Currently   Allergies  Allergen Reactions  . Hydralazine Shortness Of Breath and Swelling    Body aches  . Lisinopril Swelling  . Shellfish Allergy Anaphylaxis    Shrimp/lobster  Shrimp/lobster    . Kiwi Extract Swelling  . Metformin Other (See Comments)    Other reaction(s): GI Intolerance  . Tape Itching    Use paper tape whenever possible   Prior to Admission medications   Medication Sig Start Date End Date Taking? Authorizing Provider  amLODipine (NORVASC) 10 MG tablet Take 1 tablet (10 mg total) by mouth daily. 03/03/20  Yes Lorella Nimrod, MD  aspirin EC 81 MG EC tablet Take 1 tablet (81 mg total) by mouth daily. 11/26/19  Yes Lavina Hamman, MD  atorvastatin (LIPITOR) 80 MG tablet Take 1 tablet (80 mg total) by mouth daily at 6 PM. Patient taking differently: Take 80 mg by mouth daily.  11/26/19  Yes Lavina Hamman, MD  carvedilol (COREG) 12.5 MG tablet Take 12.5 mg by mouth 2 (two) times daily with a meal.   Yes [provider]  Fluticasone-Salmeterol (ADVAIR) 250-50 MCG/DOSE AEPB Inhale 1 puff into the lungs 2 (two) times daily.   Yes [provider]  furosemide (LASIX) 40 MG tablet Take 1 tablet (40 mg total) by mouth daily. 03/02/20  Yes Lorella Nimrod, MD  insulin aspart protamine- aspart (NOVOLOG MIX 70/30) (70-30) 100 UNIT/ML injection Inject 0.4 mLs (40 Units total) into the skin 2 (two) times daily with a meal. Patient taking differently: Inject 45 Units into the skin 2 (two) times daily with a meal.   11/26/19  Yes Lavina Hamman, MD  isosorbide mononitrate (IMDUR) 30 MG 24 hr tablet Take 30 mg by mouth daily.   Yes [provider]  lisinopril (ZESTRIL) 30 MG tablet Take 30 mg by mouth daily.   Yes [provider]  methocarbamol (ROBAXIN) 500 MG tablet Take 1 tablet (500 mg total) by mouth every 8 (eight) hours as needed for muscle spasms. 11/26/19  Yes Lavina Hamman, MD  mirtazapine (REMERON) 30 MG tablet Take 1 tablet (30 mg total) by mouth at bedtime. 11/26/19  Yes Lavina Hamman, MD  mometasone-formoterol [facility]) 200-5 MCG/ACT AERO Inhale 2 puffs into the lungs 2 (two) times daily.   Yes [provider]  topiramate (TOPAMAX) 25 MG capsule Take 25 mg by mouth daily.   Yes [provider]  valACYclovir (VALTREX) 500 MG tablet Take 1 tablet by mouth 2 (two) times daily. 05/20/17  Yes [provider]    Review of Systems  Constitutional: Positive for fatigue (easily). Negative for appetite change.  HENT: Positive for postnasal drip (Patient reports lots of thick mucous secretions). Negative for congestion and sore throat.   Eyes: Positive for visual disturbance (blind in left eye). Negative for pain.  Respiratory: Positive for shortness of breath. Negative for cough.   Cardiovascular: Positive for chest pain and leg swelling. Negative for palpitations.  Gastrointestinal: Positive for nausea (Reports nausea at certain smells) and vomiting. Negative for abdominal distention and abdominal pain.  Endocrine: Negative.   Genitourinary: Negative.   Musculoskeletal: Positive for back pain. Negative for neck pain.  Skin: Negative.   Allergic/Immunologic: Negative.   Neurological: Positive for weakness and light-headedness. Negative for dizziness.  Hematological: Negative for adenopathy. Does not bruise/bleed easily.  Psychiatric/Behavioral: Negative for dysphoric mood and sleep disturbance (sleeping on 1 pillow). The patient is not nervous/anxious.     Vitals:   10/15/20 1155  BP: (!) 142/79  Pulse: 65  Resp: 18  SpO2: 99%  Weight: 188 lb 2 oz (85.3 kg)  Height: 5\' 6"  (1.676 m)   Wt Readings from Last 3 Encounters:  10/15/20 188 lb 2 oz (85.3 kg)  10/12/20 200 lb 9.9 oz (91 kg)  09/12/20 181 lb (82.1 kg)   Lab Results  Component Value Date   CREATININE 3.87 (H) 10/12/2020   CREATININE 3.79 (H) 10/11/2020   CREATININE 3.86 (H) 10/10/2020    Physical Exam Vitals and nursing note reviewed.  Constitutional:      Appearance: Normal appearance.  HENT:     Head: Normocephalic and atraumatic.  Cardiovascular:     Rate and Rhythm: Normal rate and regular rhythm.  Pulmonary:     Effort: Pulmonary effort is normal. No respiratory distress.     Breath sounds: No wheezing or rales.  Abdominal:     General: There is no distension.     Palpations: Abdomen is soft.  Musculoskeletal:        General: No tenderness.     Cervical back: Normal  range of motion and neck supple.     Right lower leg: Edema (1+ pitting) present.     Left lower leg: Edema (1+ pitting) present.  Skin:    General: Skin is warm and dry.  Neurological:     Mental Status: She is alert and oriented to person, place, and time. Mental status is at baseline.  Psychiatric:        Mood and Affect: Mood is anxious.        Behavior: Behavior normal.        Thought Content: Thought content normal.    Assessment & Plan:  1: Chronic heart failure with reduced ejection fraction- - NYHA class III - euvolemic today - weighing daily and she says that her weight has been stable; reminded to call for an overnight weight gain of >2 pounds or a weekly weight gain of >5 pounds - weight up 4 pounds from last visit here 6 months ago - not adding salt and has been trying to look at food labels; reviewed the importance of closely following a 2000mg  sodium diet  - participating in paramedicine program & she is trying to work with Education officer, museum to inquire about possible aid in  the home  - BNP 10/10/2020 was 1251.0 - renal function limits the use of entresto  2: HTN- - BP slightly elevated today - saw PCP ([place]) 09/22/20 - Renal function panel 10/12/20 showed Sodium 139, Potassium 3.6, Creatinine 3.86, GFR 13.  3: DM- - saw endocrinology Pasty Arch) 06/19/20 - A1c 02/27/20 was 8.6% - glucose in office 147 - saw nephrology ([place]) 08/04/20, patient to see nephrology in office in 2 weeks  4: Anxiety- - patient is mildly anxious and reports concerns about ADL management -SW and Paramedic attempting to obtain an aid for patient  5: Lymphedema- - stage 2 - wearing compression socks  - elevate legs when sitting for long periods of time - limited in her ability to exercise due to her symptoms   Medication lists were reviewed, patient requested to stop taking her Valtrex. Shared decision making with patient regarding holding the valtrex unless she has an outbreak.   Return in 4 months or sooner for any questions/problems befofre then.

## 2020-10-16 ENCOUNTER — Telehealth (HOSPITAL_COMMUNITY): Payer: Self-pay | Admitting: Licensed Clinical Social Worker

## 2020-10-16 NOTE — Telephone Encounter (Signed)
[place] called CSW to inquire about aid services for patient as she is struggling with completing home duties due to medical condition and vision concerns.  Pt has Medicaid so CSW able to complete PCS application and send to pt Heart Failure provider, Darylene Price, for review and signature.   Will continue to follow and turn in paperwork once completed  Jorge Ny, [facility] Desk#: [phone] Cell#: [phone]

## 2020-10-16 NOTE — Telephone Encounter (Signed)
Signed PCS form faxed back to CSW.  CSW sent in to [place] for review- fax confirmation received   Will continue to follow and assist as needed  Jorge Ny, [facility] Desk#: [phone] Cell#: [phone]

## 2020-10-20 ENCOUNTER — Other Ambulatory Visit: Payer: Self-pay

## 2020-10-20 NOTE — Patient Outreach (Signed)
[facility]) Care Management  10/20/2020  KYLEA BERRONG Oct 17, 1961 [id number]    EMMI-General Discharge RED ON EMMI ALERT Day # 4 Date: 10/18/2020 Red Alert Reason: "Unfilled prescriptions? Yes Able to fill today/tomorrow? No"   Outreach attempt #1 to patient. Spoke with patient who denies any acute issues or concerns at this time. Reviewed and addressed red alerts. Patient reports she was unable to get inhaler due to cost of $70. She voices that she did not go to her usual pharmacy to get med filled. Patient confirms she has Medicare and Medicaid. Advised patient that her co-pay should be much lower than that. She does not recall showing them both of her insurance cards. Advised patient to make sure pharmacy has correct insurance info on file and she voiced understanding. She will take both cards back to pharmacy. She states that she was given some samples of med and has enough at present. She has PCP follow up appt this week. If for some reason she is still unable to obtain med she will discuss alternative options with MD. She denies any further RN CM needs or concerns at this time. Advised patient that they would get one more automated EMMI-GENERAL post discharge calls to assess how they are doing following recent hospitalization and will receive a call from a nurse if any of their responses were abnormal. Patient voiced understanding and was appreciative of f/u call.     Plan: RN CM will close case at this time.   [name], RN,BSN,CCM [place] Management Telephonic Care Management Coordinator Direct Phone: [phone] Toll Free: [phone] Fax: [phone]

## 2020-10-21 ENCOUNTER — Telehealth (HOSPITAL_COMMUNITY): Payer: Self-pay

## 2020-10-21 ENCOUNTER — Emergency Department: Payer: Medicare HMO

## 2020-10-21 ENCOUNTER — Inpatient Hospital Stay
Admission: EM | Admit: 2020-10-21 | Discharge: 2020-10-28 | DRG: 291 | Disposition: A | Discharge status: Home Health | Payer: Medicare HMO | Attending: Internal Medicine | Admitting: Internal Medicine

## 2020-10-21 ENCOUNTER — Other Ambulatory Visit: Payer: Self-pay

## 2020-10-21 DIAGNOSIS — Z23 Encounter for immunization: Secondary | ICD-10-CM

## 2020-10-21 DIAGNOSIS — I509 Heart failure, unspecified: Secondary | ICD-10-CM | POA: Diagnosis not present

## 2020-10-21 DIAGNOSIS — I152 Hypertension secondary to endocrine disorders: Secondary | ICD-10-CM | POA: Diagnosis present

## 2020-10-21 DIAGNOSIS — R0689 Other abnormalities of breathing: Secondary | ICD-10-CM | POA: Diagnosis not present

## 2020-10-21 DIAGNOSIS — N136 Pyonephrosis: Secondary | ICD-10-CM | POA: Diagnosis present

## 2020-10-21 DIAGNOSIS — R809 Proteinuria, unspecified: Secondary | ICD-10-CM | POA: Diagnosis not present

## 2020-10-21 DIAGNOSIS — I10 Essential (primary) hypertension: Secondary | ICD-10-CM | POA: Diagnosis not present

## 2020-10-21 DIAGNOSIS — I132 Hypertensive heart and chronic kidney disease with heart failure and with stage 5 chronic kidney disease, or end stage renal disease: Principal | ICD-10-CM | POA: Diagnosis present

## 2020-10-21 DIAGNOSIS — I517 Cardiomegaly: Secondary | ICD-10-CM | POA: Diagnosis not present

## 2020-10-21 DIAGNOSIS — N184 Chronic kidney disease, stage 4 (severe): Secondary | ICD-10-CM | POA: Diagnosis present

## 2020-10-21 DIAGNOSIS — G4733 Obstructive sleep apnea (adult) (pediatric): Secondary | ICD-10-CM | POA: Diagnosis present

## 2020-10-21 DIAGNOSIS — R069 Unspecified abnormalities of breathing: Secondary | ICD-10-CM | POA: Diagnosis not present

## 2020-10-21 DIAGNOSIS — K5900 Constipation, unspecified: Secondary | ICD-10-CM | POA: Diagnosis present

## 2020-10-21 DIAGNOSIS — E1122 Type 2 diabetes mellitus with diabetic chronic kidney disease: Secondary | ICD-10-CM | POA: Diagnosis not present

## 2020-10-21 DIAGNOSIS — E785 Hyperlipidemia, unspecified: Secondary | ICD-10-CM | POA: Diagnosis present

## 2020-10-21 DIAGNOSIS — Z91013 Allergy to seafood: Secondary | ICD-10-CM | POA: Diagnosis not present

## 2020-10-21 DIAGNOSIS — M545 Low back pain, unspecified: Secondary | ICD-10-CM | POA: Diagnosis not present

## 2020-10-21 DIAGNOSIS — I110 Hypertensive heart disease with heart failure: Secondary | ICD-10-CM | POA: Diagnosis not present

## 2020-10-21 DIAGNOSIS — I5023 Acute on chronic systolic (congestive) heart failure: Secondary | ICD-10-CM

## 2020-10-21 DIAGNOSIS — E876 Hypokalemia: Secondary | ICD-10-CM | POA: Diagnosis present

## 2020-10-21 DIAGNOSIS — M546 Pain in thoracic spine: Secondary | ICD-10-CM | POA: Diagnosis not present

## 2020-10-21 DIAGNOSIS — Z951 Presence of aortocoronary bypass graft: Secondary | ICD-10-CM | POA: Diagnosis not present

## 2020-10-21 DIAGNOSIS — Z20822 Contact with and (suspected) exposure to covid-19: Secondary | ICD-10-CM | POA: Diagnosis not present

## 2020-10-21 DIAGNOSIS — Z7982 Long term (current) use of aspirin: Secondary | ICD-10-CM

## 2020-10-21 DIAGNOSIS — R079 Chest pain, unspecified: Secondary | ICD-10-CM | POA: Diagnosis not present

## 2020-10-21 DIAGNOSIS — Z794 Long term (current) use of insulin: Secondary | ICD-10-CM

## 2020-10-21 DIAGNOSIS — I130 Hypertensive heart and chronic kidney disease with heart failure and stage 1 through stage 4 chronic kidney disease, or unspecified chronic kidney disease: Principal | ICD-10-CM | POA: Diagnosis present

## 2020-10-21 DIAGNOSIS — I129 Hypertensive chronic kidney disease with stage 1 through stage 4 chronic kidney disease, or unspecified chronic kidney disease: Secondary | ICD-10-CM | POA: Diagnosis not present

## 2020-10-21 DIAGNOSIS — Z87891 Personal history of nicotine dependence: Secondary | ICD-10-CM

## 2020-10-21 DIAGNOSIS — J90 Pleural effusion, not elsewhere classified: Secondary | ICD-10-CM | POA: Diagnosis not present

## 2020-10-21 DIAGNOSIS — R531 Weakness: Secondary | ICD-10-CM | POA: Diagnosis not present

## 2020-10-21 DIAGNOSIS — J189 Pneumonia, unspecified organism: Secondary | ICD-10-CM | POA: Diagnosis not present

## 2020-10-21 DIAGNOSIS — Z79899 Other long term (current) drug therapy: Secondary | ICD-10-CM | POA: Diagnosis not present

## 2020-10-21 DIAGNOSIS — N179 Acute kidney failure, unspecified: Secondary | ICD-10-CM | POA: Diagnosis present

## 2020-10-21 DIAGNOSIS — E119 Type 2 diabetes mellitus without complications: Secondary | ICD-10-CM

## 2020-10-21 DIAGNOSIS — J811 Chronic pulmonary edema: Secondary | ICD-10-CM | POA: Diagnosis not present

## 2020-10-21 DIAGNOSIS — I5043 Acute on chronic combined systolic (congestive) and diastolic (congestive) heart failure: Secondary | ICD-10-CM | POA: Diagnosis present

## 2020-10-21 DIAGNOSIS — N185 Chronic kidney disease, stage 5: Secondary | ICD-10-CM | POA: Diagnosis not present

## 2020-10-21 DIAGNOSIS — E1165 Type 2 diabetes mellitus with hyperglycemia: Secondary | ICD-10-CM | POA: Diagnosis present

## 2020-10-21 DIAGNOSIS — D75839 Thrombocytosis, unspecified: Secondary | ICD-10-CM | POA: Diagnosis present

## 2020-10-21 DIAGNOSIS — R600 Localized edema: Secondary | ICD-10-CM | POA: Diagnosis not present

## 2020-10-21 DIAGNOSIS — I2510 Atherosclerotic heart disease of native coronary artery without angina pectoris: Secondary | ICD-10-CM | POA: Diagnosis present

## 2020-10-21 DIAGNOSIS — Z87441 Personal history of nephrotic syndrome: Secondary | ICD-10-CM | POA: Diagnosis not present

## 2020-10-21 DIAGNOSIS — E871 Hypo-osmolality and hyponatremia: Secondary | ICD-10-CM | POA: Diagnosis not present

## 2020-10-21 DIAGNOSIS — E1159 Type 2 diabetes mellitus with other circulatory complications: Secondary | ICD-10-CM | POA: Diagnosis present

## 2020-10-21 DIAGNOSIS — E11319 Type 2 diabetes mellitus with unspecified diabetic retinopathy without macular edema: Secondary | ICD-10-CM | POA: Diagnosis present

## 2020-10-21 DIAGNOSIS — E1169 Type 2 diabetes mellitus with other specified complication: Secondary | ICD-10-CM | POA: Diagnosis present

## 2020-10-21 DIAGNOSIS — J449 Chronic obstructive pulmonary disease, unspecified: Secondary | ICD-10-CM | POA: Diagnosis present

## 2020-10-21 DIAGNOSIS — Z8673 Personal history of transient ischemic attack (TIA), and cerebral infarction without residual deficits: Secondary | ICD-10-CM

## 2020-10-21 DIAGNOSIS — R0602 Shortness of breath: Secondary | ICD-10-CM | POA: Diagnosis not present

## 2020-10-21 DIAGNOSIS — I252 Old myocardial infarction: Secondary | ICD-10-CM

## 2020-10-21 DIAGNOSIS — H5462 Unqualified visual loss, left eye, normal vision right eye: Secondary | ICD-10-CM | POA: Diagnosis present

## 2020-10-21 DIAGNOSIS — G8929 Other chronic pain: Secondary | ICD-10-CM | POA: Diagnosis present

## 2020-10-21 DIAGNOSIS — I5020 Unspecified systolic (congestive) heart failure: Secondary | ICD-10-CM | POA: Diagnosis not present

## 2020-10-21 DIAGNOSIS — N183 Chronic kidney disease, stage 3 unspecified: Secondary | ICD-10-CM | POA: Diagnosis not present

## 2020-10-21 HISTORY — DX: Cerebral infarction, unspecified: I63.9

## 2020-10-21 LAB — CBC WITH DIFFERENTIAL/PLATELET
Abs Immature Granulocytes: 0.06 10*3/uL (ref 0.00–0.07)
Basophils Absolute: 0.1 10*3/uL (ref 0.0–0.1)
Basophils Relative: 1 %
Eosinophils Absolute: 0.3 10*3/uL (ref 0.0–0.5)
Eosinophils Relative: 3 %
HCT: 31.1 % — ABNORMAL LOW (ref 36.0–46.0)
Hemoglobin: 10 g/dL — ABNORMAL LOW (ref 12.0–15.0)
Immature Granulocytes: 1 %
Lymphocytes Relative: 14 %
Lymphs Abs: 1.7 10*3/uL (ref 0.7–4.0)
MCH: 29.6 pg (ref 26.0–34.0)
MCHC: 32.2 g/dL (ref 30.0–36.0)
MCV: 92 fL (ref 80.0–100.0)
Monocytes Absolute: 0.7 10*3/uL (ref 0.1–1.0)
Monocytes Relative: 6 %
Neutro Abs: 9.3 10*3/uL — ABNORMAL HIGH (ref 1.7–7.7)
Neutrophils Relative %: 75 %
Platelets: 376 10*3/uL (ref 150–400)
RBC: 3.38 MIL/uL — ABNORMAL LOW (ref 3.87–5.11)
RDW: 14.5 % (ref 11.5–15.5)
WBC: 12.2 10*3/uL — ABNORMAL HIGH (ref 4.0–10.5)
nRBC: 0 % (ref 0.0–0.2)

## 2020-10-21 LAB — BLOOD GAS, VENOUS
Acid-base deficit: 4.7 mmol/L — ABNORMAL HIGH (ref 0.0–2.0)
Bicarbonate: 21.6 mmol/L (ref 20.0–28.0)
O2 Saturation: 71.4 %
Patient temperature: 37
pCO2, Ven: 44 mmHg (ref 44.0–60.0)
pH, Ven: 7.3 (ref 7.250–7.430)
pO2, Ven: 42 mmHg (ref 32.0–45.0)

## 2020-10-21 LAB — LACTIC ACID, PLASMA: Lactic Acid, Venous: 1.1 mmol/L (ref 0.5–1.9)

## 2020-10-21 LAB — COMPREHENSIVE METABOLIC PANEL
ALT: 8 U/L (ref 0–44)
AST: 17 U/L (ref 15–41)
Albumin: 2.4 g/dL — ABNORMAL LOW (ref 3.5–5.0)
Alkaline Phosphatase: 70 U/L (ref 38–126)
Anion gap: 14 (ref 5–15)
BUN: 44 mg/dL — ABNORMAL HIGH (ref 6–20)
CO2: 18 mmol/L — ABNORMAL LOW (ref 22–32)
Calcium: 8.2 mg/dL — ABNORMAL LOW (ref 8.9–10.3)
Chloride: 110 mmol/L (ref 98–111)
Creatinine, Ser: 3.37 mg/dL — ABNORMAL HIGH (ref 0.44–1.00)
GFR, Estimated: 15 mL/min — ABNORMAL LOW (ref >60)
Glucose, Bld: 222 mg/dL — ABNORMAL HIGH (ref 70–99)
Potassium: 3.7 mmol/L (ref 3.5–5.1)
Sodium: 142 mmol/L (ref 135–145)
Total Bilirubin: 1.1 mg/dL (ref 0.3–1.2)
Total Protein: 6.7 g/dL (ref 6.5–8.1)

## 2020-10-21 LAB — BRAIN NATRIURETIC PEPTIDE: B Natriuretic Peptide: 1384.4 pg/mL — ABNORMAL HIGH (ref 0.0–100.0)

## 2020-10-21 LAB — TROPONIN I (HIGH SENSITIVITY): Troponin I (High Sensitivity): 17 ng/L (ref <18)

## 2020-10-21 IMAGING — DX DG CHEST 1V PORT
1 series · 1 of 1 positions shown · non-contrast
Comparison: [DATE]

CLINICAL DATA: Shortness of breath for 1 week.

EXAM:
PORTABLE CHEST 1 VIEW

[chest ap]
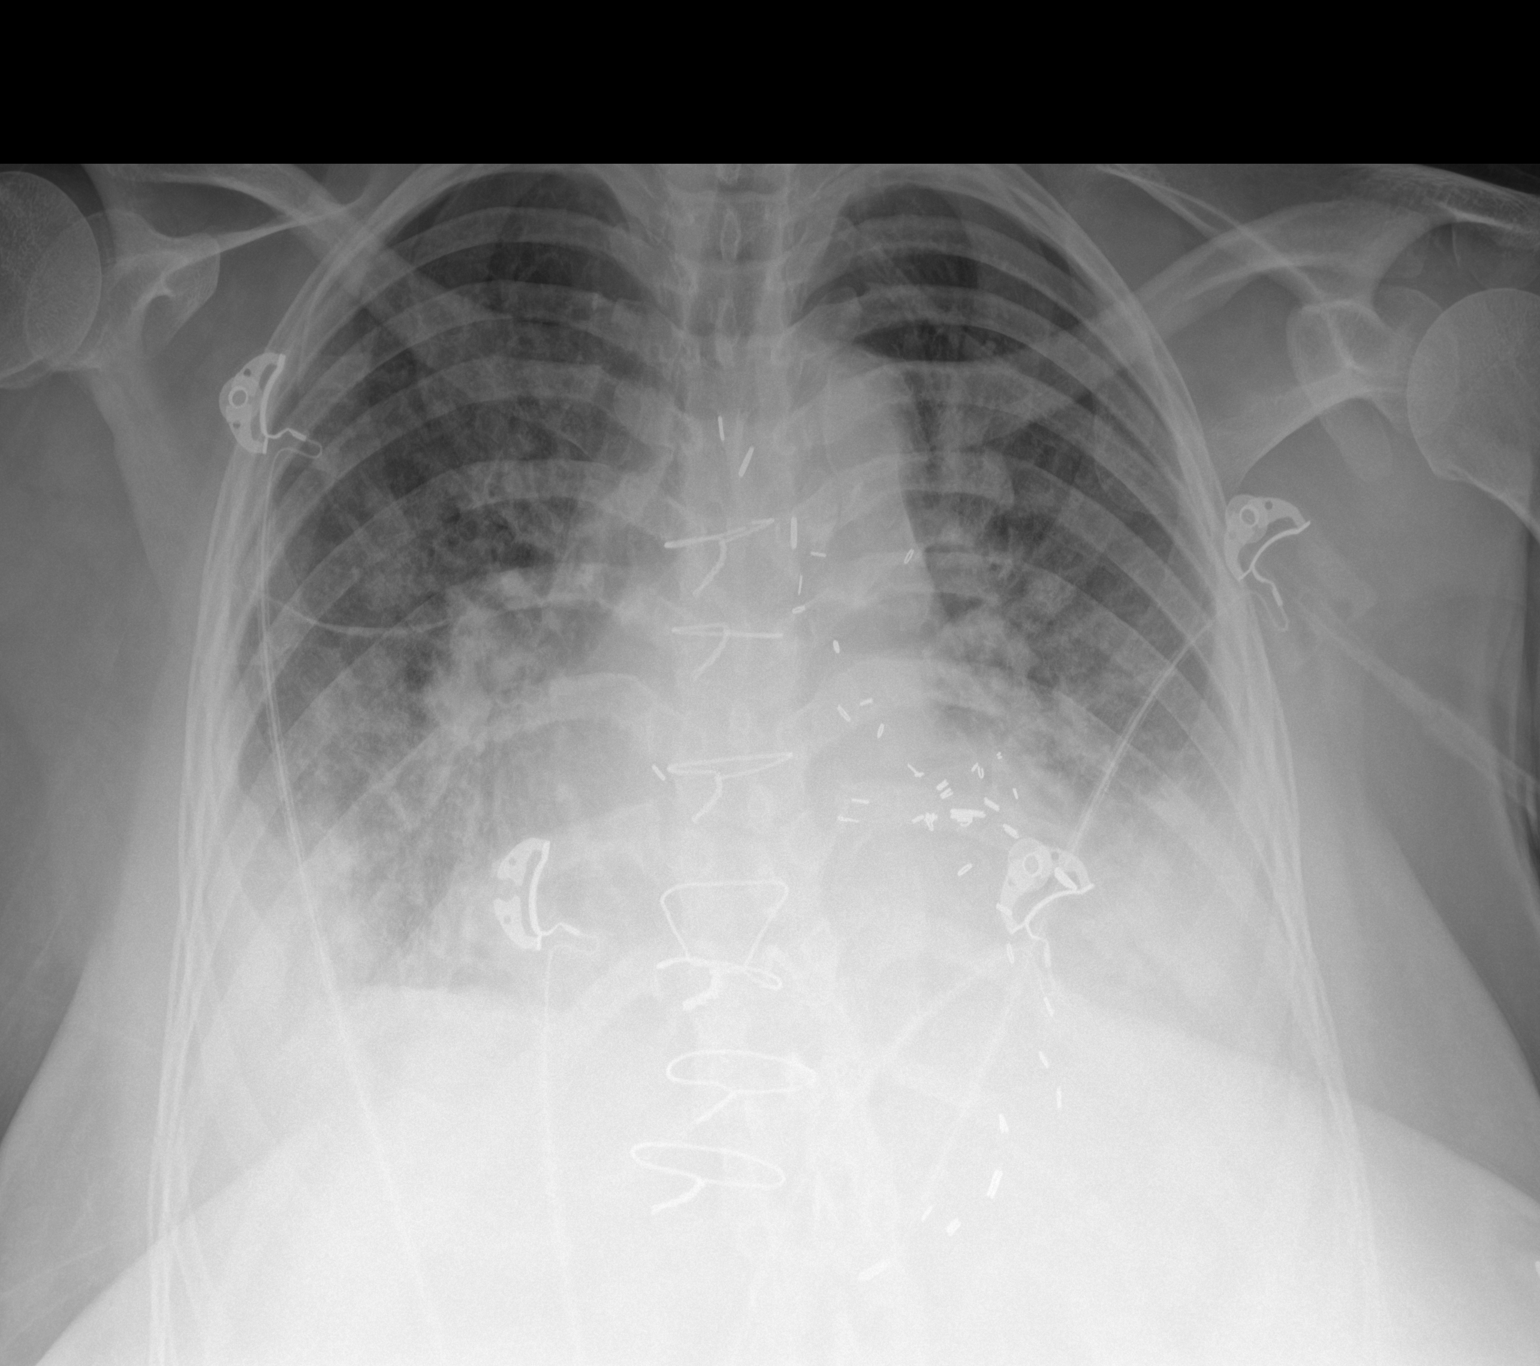

[1 of 1 positions shown; findings below may reference images not displayed]

FINDINGS: Post median sternotomy and CABG mild cardiomegaly. Progressive
bilateral pleural effusions, now moderate in degree. Fluid in the
right minor fissure. Mild pulmonary edema. The previous nodular
densities in the left greater than right lung are not definitively
seen and may be partially obscured on the current exam by pulmonary
edema. No pneumothorax. No acute osseous abnormalities are seen.
IMPRESSION: Mild cardiomegaly with pulmonary edema and bilateral pleural
effusions, consistent with CHF.

## 2020-10-21 MED ORDER — NITROGLYCERIN 2 % TD OINT
0.5000 [in_us] | TOPICAL_OINTMENT | Freq: Once | TRANSDERMAL | Status: AC
  Administered 2020-10-21: 0.5 [in_us] via TOPICAL
  Filled 2020-10-21: qty 1

## 2020-10-21 MED ORDER — IPRATROPIUM-ALBUTEROL 0.5-2.5 (3) MG/3ML IN SOLN
3.0000 mL | Freq: Once | RESPIRATORY_TRACT | Status: AC
  Administered 2020-10-21: 3 mL via RESPIRATORY_TRACT
  Filled 2020-10-21: qty 3

## 2020-10-21 MED ORDER — FUROSEMIDE 10 MG/ML IJ SOLN
20.0000 mg | Freq: Once | INTRAMUSCULAR | Status: AC
  Administered 2020-10-21: 20 mg via INTRAVENOUS
  Filled 2020-10-21: qty 4

## 2020-10-21 MED ORDER — FUROSEMIDE 10 MG/ML IJ SOLN
20.0000 mg | Freq: Once | INTRAMUSCULAR | Status: AC
  Administered 2020-10-22: 20 mg via INTRAVENOUS
  Filled 2020-10-21: qty 4

## 2020-10-21 NOTE — ED Notes (Signed)
Attempted IV without success. RN able to obtain light green and lavender blood tubes.

## 2020-10-21 NOTE — Telephone Encounter (Signed)
Had a phone visit with Terry Arroyo today.  She states doing ok, she states has her medications.  She states she has hard time doing things.  Tammy Sours is working to get her an aid for help, advised her that.  She also said heard from [employer] and they will be delivered to tomorrow.  She appears very thankful for those.  She is aware of up coming appts.  Wanted to just let her know that we were working on helping her, she moved in to apartment by herself where she was living with her Dad.  She is having some difficulty living by herself but she is trying to do her best to be on her own.  Will continue to visit for heart failure.   [place] [phone]

## 2020-10-21 NOTE — Assessment & Plan Note (Addendum)
Blood pressure (!) 201/90, pulse 66, temperature 98.4 F (36.9 C), temperature source Oral, resp. rate (!) 22, weight 82.6 kg, SpO2 100 %. NTG ointment continued. Home regimen of torsemide held and continued pt on lasix at 40 mg iv q8h x 2 dose.  Pt is not on ACEI/ARB/ or ARNI consider cardiology consult for repeat assessmenet for rec frequent spirdoes of heart failure Als c? Compliance and education.? Depreesion.

## 2020-10-21 NOTE — ED Triage Notes (Signed)
Pt presents to ER c/o SOB x1 week.  Pt has hx of CHF, CKD, HTN.  Pt has tachypnea and wheezing upon arrival.  Pt denies chest pain.

## 2020-10-21 NOTE — ED Provider Notes (Signed)
[facility] Emergency Department Provider Note   ____________________________________________   First MD Initiated Contact with Patient 10/21/20 1914     (approximate)  I have reviewed the triage vital signs and the nursing notes.   HISTORY  Chief Complaint Shortness of Breath  Chief complaint shortness of breath  HPI Terry Arroyo is a 59 y.o. female who reports she was in the hospital a couple weeks ago.  She had pneumonia.  Several days after she left the hospital she got short of breath.  She reports that short of breath came on very rapidly.  Her legs began swelling bilaterally.  Now she is very short of breath and taking short rapid breaths.  Her legs are swollen bilaterally.  She is reports she has low back pain when she breathes but not any pain in the chest or upper back.  She is not coughing.  She had not had a fever today.      Past Medical History:  Diagnosis Date  . Blind left eye   . Brain tumor ([place]) 1986  . CHF (congestive heart failure) ([place])   . Diabetes mellitus without complication ([place])   . Hyperlipidemia   . Hypertension   . MI (myocardial infarction) ([place]) 2018    Patient Active Problem List   Diagnosis Date Noted  . HFrEF (heart failure with reduced ejection fraction) ([place])   . Acute respiratory distress   . COPD exacerbation ([place]) 10/10/2020  . Kidney hematoma 09/13/2020  . Hypertension complicating diabetes ([place])   . AKI (acute kidney injury) ([place])   . Hematuria 09/12/2020  . Calf tenderness   . Acute on chronic heart failure ([place]) 02/27/2020  . Leg edema   . DM2 (diabetes mellitus, type 2) ([place]) 02/26/2020  . CKD (chronic kidney disease), stage III ([place]) 02/26/2020  . Hyponatremia 02/26/2020  . Anasarca 02/26/2020  . Proteinuria 02/26/2020  . Hypoalbuminemia 02/26/2020  . Elevated troponin 02/26/2020  . Acute on chronic systolic CHF (congestive heart failure) ([place]) 02/26/2020  . Hyperglycemia   . Nausea     . Hypertensive emergency   . Hypertensive urgency 11/24/2019  . Nonspecific chest pain 11/23/2019    Past Surgical History:  Procedure Laterality Date  . BRAIN SURGERY    . BREAST EXCISIONAL BIOPSY Left yrs ago   benign  . CESAREAN SECTION      Prior to Admission medications   Medication Sig Start Date End Date Taking? Authorizing Provider  albuterol (VENTOLIN HFA) 108 (90 Base) MCG/ACT inhaler Inhale 1-2 puffs into the lungs every 6 (six) hours as needed for wheezing or shortness of breath.    [provider]  amLODipine (NORVASC) 10 MG tablet Take 1 tablet (10 mg total) by mouth daily. 03/03/20   Lorella Nimrod, MD  ammonium lactate (LAC-HYDRIN) 12 % lotion Apply 1 application topically as needed for dry skin.    [provider]  aspirin 81 MG EC tablet Take 1 tablet (81 mg total) by mouth daily. Do not take this medication for 1 week and then restart. 09/15/20   Wyvonnia Dusky, MD  atorvastatin (LIPITOR) 80 MG tablet Take 1 tablet (80 mg total) by mouth daily at 6 PM. 11/26/19   Lavina Hamman, MD  carvedilol (COREG) 25 MG tablet Take 25 mg by mouth 2 (two) times daily with a meal.     [provider]  cyanocobalamin 1000 MCG tablet Take 1,000 mcg by mouth daily.    [provider]  cyclobenzaprine (FLEXERIL) 5 MG tablet Take 5 mg by mouth 2 (two) times daily as needed for muscle spasms. 09/01/20   [provider]  fenofibrate (TRICOR) 145 MG tablet Take 145 mg by mouth daily.    [provider]  GLYCERIN-HYPROMELLOSE-PEG 400 OP Place 1 drop into both eyes 4 (four) times daily.    [provider]  insulin aspart protamine- aspart (NOVOLOG MIX 70/30) (70-30) 100 UNIT/ML injection Inject 0.4 mLs (40 Units total) into the skin 2 (two) times daily with a meal. Patient taking differently: Inject 45 Units into the skin 2 (two) times daily with a meal.  11/26/19   Lavina Hamman, MD  isosorbide mononitrate (IMDUR) 30 MG 24 hr  tablet Take 30 mg by mouth daily.    [provider]  mirtazapine (REMERON) 30 MG tablet Take 1 tablet (30 mg total) by mouth at bedtime. 11/26/19   Lavina Hamman, MD  mometasone-formoterol [facility]) 200-5 MCG/ACT AERO Inhale 2 puffs into the lungs 2 (two) times daily.    [provider]  omeprazole (PRILOSEC) 20 MG capsule Take 20 mg by mouth daily.  09/22/20   [provider]  sodium bicarbonate 650 MG tablet Take 2 tablets (1,300 mg total) by mouth 3 (three) times daily. 10/12/20   Lorella Nimrod, MD  topiramate (TOPAMAX) 25 MG tablet Take 25 mg by mouth daily. 08/19/20   [provider]  torsemide (DEMADEX) 10 MG tablet Take 2 tablets (20 mg total) by mouth daily. 10/12/20   Lorella Nimrod, MD  valACYclovir (VALTREX) 500 MG tablet Take 1 tablet (500 mg total) by mouth every other day. 10/13/20   Lorella Nimrod, MD    Allergies Hydralazine, Lisinopril, Shellfish allergy, Kiwi extract, Metformin, and Tape  Family History  Problem Relation Age of Onset  . CAD Mother   . CAD Brother   . Breast cancer Sister 30    Social History Social History   Tobacco Use  . Smoking status: Former Research scientist (life sciences)  . Smokeless tobacco: Never Used  Substance Use Topics  . Alcohol use: Not Currently  . Drug use: Not Currently    Review of Systems  Constitutional: No fever/chills Eyes: No visual changes. ENT: No sore throat. Cardiovascular: Denies chest pain. Respiratory:  shortness of breath. Gastrointestinal: No abdominal pain.  No nausea, no vomiting.  No diarrhea.  No constipation. Genitourinary: Negative for dysuria. Musculoskeletal: Low back pain. Skin: Negative for rash. Neurological: Negative for headaches, focal weakness   ____________________________________________   PHYSICAL EXAM:  VITAL SIGNS: ED Triage Vitals  Enc Vitals Group     BP      Pulse      Resp      Temp      Temp src      SpO2      Weight      Height      Head Circumference       Peak Flow      Pain Score      Pain Loc      Pain Edu?      Excl. in [place]?     Constitutional: Alert and oriented.  Taking shallow rapid respirations Eyes: Conjunctivae are normal. PER. EOMI. Head: Atraumatic. Nose: No congestion/rhinnorhea. Mouth/Throat: Mucous membranes are moist.  Oropharynx non-erythematous. Neck: No stridor. Cardiovascular: Normal rate, regular rhythm. Grossly normal heart sounds.  Good peripheral circulation. Respiratory: Increased respiratory effort.  No retractions. Lungs CTAB. Gastrointestinal: Soft and nontender. No distention. No abdominal bruits.  Musculoskeletal: No lower extremity tenderness bilateral edema.   Neurologic:  Normal speech and language. No gross focal neurologic deficits are appreciated. No gait instability. Skin:  Skin is warm, dry and intact. No rash noted.   ____________________________________________   LABS (all labs ordered are listed, but only abnormal results are displayed)  Labs Reviewed  COMPREHENSIVE METABOLIC PANEL - Abnormal; Notable for the following components:      Result Value   CO2 18 (*)    Glucose, Bld 222 (*)    BUN 44 (*)    Creatinine, Ser 3.37 (*)    Calcium 8.2 (*)    Albumin 2.4 (*)    GFR, Estimated 15 (*)    All other components within normal limits  BRAIN NATRIURETIC PEPTIDE - Abnormal; Notable for the following components:   B Natriuretic Peptide 1,384.4 (*)    All other components within normal limits  CBC WITH DIFFERENTIAL/PLATELET - Abnormal; Notable for the following components:   WBC 12.2 (*)    RBC 3.38 (*)    Hemoglobin 10.0 (*)    HCT 31.1 (*)    Neutro Abs 9.3 (*)    All other components within normal limits  BLOOD GAS, VENOUS - Abnormal; Notable for the following components:   Acid-base deficit 4.7 (*)    All other components within normal limits  RESPIRATORY PANEL BY RT PCR (FLU A&B, COVID)  LACTIC ACID, PLASMA  LACTIC ACID, PLASMA  URINALYSIS, COMPLETE (UACMP) WITH MICROSCOPIC    FIBRIN DERIVATIVES D-DIMER ([facility] ONLY)  TROPONIN I (HIGH SENSITIVITY)  TROPONIN I (HIGH SENSITIVITY)   ____________________________________________  EKG  EKG read interpreted by me shows normal sinus rhythm rate of 69 rightward axis no acute ST-T wave changes compared to one from March.  I should say however the comparing the limb leads from today to March leads me to suspect that one of the 2 may have limb lead reversal.  I will order another EKG. ____________________________________________  RADIOLOGY Gertha Calkin, personally viewed and evaluated these images (plain radiographs) as part of my medical decision making, as well as reviewing the written report by the radiologist.  ED MD interpretation: Chest x-ray read by radiology reviewed by me does appear to be consistent with CHF.  We have not been able to get a IV yet so I will order some BiPAP to see if this will help. Official radiology report(s): DG Chest Portable 1 View  Result Date: 10/21/2020 CLINICAL DATA:  Shortness of breath for 1 week. EXAM: PORTABLE CHEST 1 VIEW COMPARISON:  10/10/2020 FINDINGS: Post median sternotomy and CABG mild cardiomegaly. Progressive bilateral pleural effusions, now moderate in degree. Fluid in the right minor fissure. Mild pulmonary edema. The previous nodular densities in the left greater than right lung are not definitively seen and may be partially obscured on the current exam by pulmonary edema. No pneumothorax. No acute osseous abnormalities are seen. IMPRESSION: Mild cardiomegaly with pulmonary edema and bilateral pleural effusions, consistent with CHF. Electronically Signed   By: Keith Rake M.D.   On: 10/21/2020 19:35    ____________________________________________   PROCEDURES  Procedure(s) performed (including Critical Care):  Procedures   ____________________________________________   INITIAL IMPRESSION / ASSESSMENT AND PLAN / ED COURSE   We were unable to get an IV  for some time. IV team had managed to get one just now. We will give her her Lasix and then we will get her admitted.  ____________________________________________   FINAL CLINICAL IMPRESSION(S) / ED DIAGNOSES  Final diagnoses:  Congestive heart failure, unspecified HF chronicity, unspecified heart failure type ([place])  Hypertension, unspecified type     ED Discharge Orders    None      *Please note:  Terry Arroyo was evaluated in Emergency Department on 10/21/2020 for the symptoms described in the history of present illness. She was evaluated in the context of the global COVID-19 pandemic, which necessitated consideration that the patient might be at risk for infection with the SARS-CoV-2 virus that causes COVID-19. Institutional protocols and algorithms that pertain to the evaluation of patients at risk for COVID-19 are in a state of rapid change based on information released by regulatory bodies including the CDC and federal and state organizations. These policies and algorithms were followed during the patient's care in the ED.  Some ED evaluations and interventions may be delayed as a result of limited staffing during and the pandemic.*   Note:  This document was prepared using Dragon voice recognition software and may include unintentional dictation errors.    Nena Polio, MD 10/21/20 2217

## 2020-10-21 NOTE — H&P (Signed)
History and Physical    Terry Arroyo OEV:[id number] DOB: 1961/05/13 DOA: 10/21/2020  PCP: Kirk Ruths, MD  Patient coming from: Home  I have personally briefly reviewed patient's old medical records in [place]  Chief Complaint: Shortness of breath  HPI: Terry Arroyo is a 59 y.o. female with medical history significant for chronic systolic CHF (EF 82-99% by TTE 02/27/2020), CAD s/p CABG, CKD stage IV-V, insulin-dependent type 2 diabetes, hypertension, hyperlipidemia, COPD, and OSA who presents to the ED for evaluation of shortness of breath.  Patient recently hospitalized from 10/10/2020-10/12/2020 for acute COPD exacerbation and community-acquired pneumonia.  She initially required BiPAP but was transitioned off.  She was treated with empiric antibiotics and IV steroids.  She did not require supplemental O2 on discharge.  Patient states over the last few days she has been having recurrent shortness of breath, worse with minimal activity.  She has had worsening swelling in both of her lower extremities.  She has had a nonproductive cough.  She denies any subjective fevers, chills, diaphoresis, chest pain, nausea, vomiting, abdominal pain, or dysuria.  Patient does state that she has been drinking up to twelve 12 oz water bottles daily.  She says she has been limiting salt intake.  ED Course:  Initial vitals showed BP 195/87, pulse 70, RR 30, temp 98.4 Fahrenheit, SPO2 99-100% on room air.  Due to difficulty obtaining IV access, patient was placed on BiPAP.  Labs show WBC 12.2, hemoglobin 10.0, platelets 376,000, sodium 142, potassium 3.7, bicarb 18, BUN 44, creatinine 3.37, serum glucose 222, BNP 1384.4, VBG shows pH 7.30/PCO2 44/PO2 42.0.  High-sensitivity troponin I is 17.  Lactic acid 1.1.  SARS-CoV-2 PCR panel is ordered and pending.  Portable chest x-ray shows cardiomegaly with pulmonary edema, bilateral pleural effusions, and prior sternotomy/CABG  changes.  Patient was given topical nitroglycerin ointment x2.  IV access was obtained and patient was given IV Lasix 20 mg.  The hospitalist service was consulted for further evaluation and management.  Review of Systems: All systems reviewed and are negative except as documented in history of present illness above.   Past Medical History:  Diagnosis Date  . Blind left eye   . Brain tumor ([place]) 1986  . CHF (congestive heart failure) ([place])   . Diabetes mellitus without complication ([place])   . Hyperlipidemia   . Hypertension   . MI (myocardial infarction) ([place]) 2018    Past Surgical History:  Procedure Laterality Date  . BRAIN SURGERY    . BREAST EXCISIONAL BIOPSY Left yrs ago   benign  . CESAREAN SECTION      Social History:  reports that she has quit smoking. She has never used smokeless tobacco. She reports previous alcohol use. She reports previous drug use.  Allergies  Allergen Reactions  . Hydralazine Shortness Of Breath and Swelling    Body aches  . Shellfish Allergy Anaphylaxis    Shrimp/lobster  Shrimp/lobster    . Kiwi Extract Swelling  . Metformin Other (See Comments)    Other reaction(s): GI Intolerance  . Tape Itching    Use paper tape whenever possible    Family History  Problem Relation Age of Onset  . CAD Mother   . CAD Brother   . Breast cancer Sister 21     Prior to Admission medications   Medication Sig Start Date End Date Taking? Authorizing Provider  furosemide (LASIX) 40 MG tablet Take 1 tablet by mouth daily. 10/02/20  Yes [provider]  albuterol (VENTOLIN HFA) 108 (90 Base) MCG/ACT inhaler Inhale 1-2 puffs into the lungs every 6 (six) hours as needed for wheezing or shortness of breath.    [provider]  amLODipine (NORVASC) 10 MG tablet Take 1 tablet (10 mg total) by mouth daily. 03/03/20   Lorella Nimrod, MD  ammonium lactate (LAC-HYDRIN) 12 % lotion Apply 1 application topically as needed for dry skin.    [provider]  aspirin 81 MG EC tablet Take 1 tablet (81 mg total) by mouth daily. Do not take this medication for 1 week and then restart. 09/15/20   Wyvonnia Dusky, MD  atorvastatin (LIPITOR) 80 MG tablet Take 1 tablet (80 mg total) by mouth daily at 6 PM. 11/26/19   Lavina Hamman, MD  carvedilol (COREG) 25 MG tablet Take 25 mg by mouth 2 (two) times daily with a meal.     [provider]  cyanocobalamin 1000 MCG tablet Take 1,000 mcg by mouth daily.    [provider]  cyclobenzaprine (FLEXERIL) 5 MG tablet Take 5 mg by mouth 2 (two) times daily as needed for muscle spasms. 09/01/20   [provider]  fenofibrate (TRICOR) 145 MG tablet Take 145 mg by mouth daily.    [provider]  GLYCERIN-HYPROMELLOSE-PEG 400 OP Place 1 drop into both eyes 4 (four) times daily.    [provider]  insulin aspart protamine- aspart (NOVOLOG MIX 70/30) (70-30) 100 UNIT/ML injection Inject 0.4 mLs (40 Units total) into the skin 2 (two) times daily with a meal. Patient taking differently: Inject 45 Units into the skin 2 (two) times daily with a meal.  11/26/19   Lavina Hamman, MD  isosorbide mononitrate (IMDUR) 30 MG 24 hr tablet Take 30 mg by mouth daily.    [provider]  mirtazapine (REMERON) 30 MG tablet Take 1 tablet (30 mg total) by mouth at bedtime. 11/26/19   Lavina Hamman, MD  mometasone-formoterol [facility]) 200-5 MCG/ACT AERO Inhale 2 puffs into the lungs 2 (two) times daily.    [provider]  omeprazole (PRILOSEC) 20 MG capsule Take 20 mg by mouth daily.  09/22/20   [provider]  sodium bicarbonate 650 MG tablet Take 2 tablets (1,300 mg total) by mouth 3 (three) times daily. 10/12/20   Lorella Nimrod, MD  topiramate (TOPAMAX) 25 MG tablet Take 25 mg by mouth daily. 08/19/20   [provider]  torsemide (DEMADEX) 10 MG tablet Take 2 tablets (20 mg total) by mouth daily. 10/12/20   Lorella Nimrod, MD    Physical  Exam: Vitals:   10/21/20 1927 10/21/20 2019 10/21/20 2030 10/21/20 2245  BP:  (!) 201/90  (!) 196/103  Pulse:  67 66 72  Resp:  (!) 29 (!) 22 (!) 21  Temp:      TempSrc:      SpO2:  99% 100% 100%  Weight: 82.6 kg      Constitutional: Resting in bed with head elevated, wearing BiPAP.  NAD, calm, comfortable Eyes: PERRL, lids and conjunctivae normal ENMT: Mucous membranes are moist. Posterior pharynx clear of any exudate or lesions.Normal dentition.  Neck: normal, supple, no masses. Respiratory: Bibasilar crackles.  Normal respiratory effort while on BiPAP, transitioned to nasal cannula during my exam. No accessory muscle use.  Cardiovascular: Regular rate and rhythm, systolic murmur present.  +2 bilateral lower extremity edema. 2+ pedal pulses. Abdomen: no tenderness, no masses palpated. No hepatosplenomegaly. Bowel sounds positive.  Musculoskeletal: no clubbing / cyanosis. No joint deformity upper and lower extremities. Good ROM, no contractures. Normal muscle tone.  Skin: no rashes, lesions, ulcers. No induration Neurologic: CN 2-12 grossly intact. Sensation intact, Strength 5/5 in all 4.  Psychiatric: Normal judgment and insight. Alert and oriented x 3. Normal mood.   Labs on Admission: I have personally reviewed following labs and imaging studies  CBC: Recent Labs  Lab 10/21/20 1931  WBC 12.2*  NEUTROABS 9.3*  HGB 10.0*  HCT 31.1*  MCV 92.0  PLT 703   Basic Metabolic Panel: Recent Labs  Lab 10/21/20 1931  NA 142  K 3.7  CL 110  CO2 18*  GLUCOSE 222*  BUN 44*  CREATININE 3.37*  CALCIUM 8.2*   GFR: Estimated Creatinine Clearance: 19.7 mL/min (A) (by C-G formula based on SCr of 3.37 mg/dL (H)). Liver Function Tests: Recent Labs  Lab 10/21/20 1931  AST 17  ALT 8  ALKPHOS 70  BILITOT 1.1  PROT 6.7  ALBUMIN 2.4*   No results for input(s): LIPASE, AMYLASE in the last 168 hours. No results for input(s): AMMONIA in the last 168 hours. Coagulation Profile: No  results for input(s): INR, PROTIME in the last 168 hours. Cardiac Enzymes: No results for input(s): CKTOTAL, CKMB, CKMBINDEX, TROPONINI in the last 168 hours. BNP (last 3 results) No results for input(s): PROBNP in the last 8760 hours. HbA1C: No results for input(s): HGBA1C in the last 72 hours. CBG: No results for input(s): GLUCAP in the last 168 hours. Lipid Profile: No results for input(s): CHOL, HDL, LDLCALC, TRIG, CHOLHDL, LDLDIRECT in the last 72 hours. Thyroid Function Tests: No results for input(s): TSH, T4TOTAL, FREET4, T3FREE, THYROIDAB in the last 72 hours. Anemia Panel: No results for input(s): VITAMINB12, FOLATE, FERRITIN, TIBC, IRON, RETICCTPCT in the last 72 hours. Urine analysis:    Component Value Date/Time   COLORURINE RED (A) 09/12/2020 0945   APPEARANCEUR CLOUDY (A) 09/12/2020 0945   LABSPEC 1.011 09/12/2020 0945   PHURINE  09/12/2020 0945    TEST NOT REPORTED DUE TO COLOR INTERFERENCE OF URINE PIGMENT   GLUCOSEU (A) 09/12/2020 0945    TEST NOT REPORTED DUE TO COLOR INTERFERENCE OF URINE PIGMENT   HGBUR (A) 09/12/2020 0945    TEST NOT REPORTED DUE TO COLOR INTERFERENCE OF URINE PIGMENT   BILIRUBINUR (A) 09/12/2020 0945    TEST NOT REPORTED DUE TO COLOR INTERFERENCE OF URINE PIGMENT   KETONESUR (A) 09/12/2020 0945    TEST NOT REPORTED DUE TO COLOR INTERFERENCE OF URINE PIGMENT   PROTEINUR (A) 09/12/2020 0945    TEST NOT REPORTED DUE TO COLOR INTERFERENCE OF URINE PIGMENT   NITRITE (A) 09/12/2020 0945    TEST NOT REPORTED DUE TO COLOR INTERFERENCE OF URINE PIGMENT   LEUKOCYTESUR (A) 09/12/2020 0945    TEST NOT REPORTED DUE TO COLOR INTERFERENCE OF URINE PIGMENT    Radiological Exams on Admission: DG Chest Portable 1 View  Result Date: 10/21/2020 CLINICAL DATA:  Shortness of breath for 1 week. EXAM: PORTABLE CHEST 1 VIEW COMPARISON:  10/10/2020 FINDINGS: Post median sternotomy and CABG mild cardiomegaly. Progressive bilateral pleural effusions, now moderate  in degree. Fluid in the right minor fissure. Mild pulmonary edema. The previous nodular densities in the left greater than right lung are not definitively seen and may be partially obscured on the current exam by pulmonary edema. No pneumothorax. No acute osseous abnormalities are seen. IMPRESSION: Mild cardiomegaly with pulmonary edema and bilateral pleural effusions, consistent with CHF. Electronically  Signed   By: Keith Rake M.D.   On: 10/21/2020 19:35    EKG: Ordered and pending.  Assessment/Plan Principal Problem:   Acute on chronic systolic congestive heart failure (HCC) Active Problems:   DM2 (diabetes mellitus, type 2) (HCC)   CKD (chronic kidney disease) stage 4, GFR 15-29 ml/min (HCC)   Hypertension associated with diabetes ([place])   CAD (coronary artery disease)   Hyperlipidemia associated with type 2 diabetes mellitus (HCC)   OSA (obstructive sleep apnea)  Terry Arroyo is a 59 y.o. female with medical history significant for chronic systolic CHF (EF 98-92% by TTE 02/27/2020), CAD s/p CABG, CKD stage IV-V, insulin-dependent type 2 diabetes, hypertension, hyperlipidemia, COPD, and OSA who is admitted with acute on chronic systolic CHF exacerbation.  Acute on chronic systolic CHF exacerbation: EF 30-35% by TTE 02/27/2020.  Pulmonary edema with bilateral pleural effusions seen on chest x-ray.  Has peripheral edema on exam.  BNP is elevated.  Reports excess water intake at home. -Continue diuresis with IV Lasix 40 mg twice daily -Initially requiring BiPAP, now weaned to nasal cannula -Use BiPAP as needed -Strict I/O's and daily weights -Continue carvedilol 25 mg twice daily -Not on ACE/ARB/spironolactone due to renal dysfunction  CAD s/p CABG: Stable, denies any chest pain.  Continue aspirin, statin, Coreg, Imdur.  COPD: Respiratory symptoms consistent with CHF exacerbation, does not appear to have any acute COPD issues at time of admission. -Continue Dulera and as needed  albuterol  CKD stage IV-V: Renal function appears near recent baseline.  Continue to monitor with diuresis.  Insulin-dependent type 2 diabetes: Last A1c 8.6% on 10/11/2020.  Place on sliding scale insulin for now and adjust as needed.  Hypertension: Continue IV Lasix diuresis as above.  Resume home Coreg, amlodipine, and Imdur.  Hyperlipidemia: Continue atorvastatin.  OSA: Patient states she is not currently using CPAP.  DVT prophylaxis: Subcutaneous heparin Code Status: Full code, confirmed with patient Family Communication: Discussed with patient, she has discussed with family Disposition Plan: From home and likely discharge to home pending adequate diuresis since respiratory improvement.  Consults called: None Admission status:  Status is: Inpatient  Remains inpatient appropriate because:IV treatments appropriate due to intensity of illness or inability to take PO and Inpatient level of care appropriate due to severity of illness   Dispo: The patient is from: Home              Anticipated d/c is to: Home              Anticipated d/c date is: 2 days              Patient currently is not medically stable to d/c.   Zada Finders MD [facility]  If 7PM-7AM, please contact night-coverage [email or web address]  10/21/2020, 11:28 PM

## 2020-10-22 ENCOUNTER — Encounter: Payer: Self-pay | Admitting: Internal Medicine

## 2020-10-22 ENCOUNTER — Telehealth (HOSPITAL_COMMUNITY): Payer: Self-pay | Admitting: Licensed Clinical Social Worker

## 2020-10-22 DIAGNOSIS — N183 Chronic kidney disease, stage 3 unspecified: Secondary | ICD-10-CM

## 2020-10-22 DIAGNOSIS — Z794 Long term (current) use of insulin: Secondary | ICD-10-CM

## 2020-10-22 DIAGNOSIS — N184 Chronic kidney disease, stage 4 (severe): Secondary | ICD-10-CM

## 2020-10-22 DIAGNOSIS — E1122 Type 2 diabetes mellitus with diabetic chronic kidney disease: Secondary | ICD-10-CM

## 2020-10-22 LAB — CBC
HCT: 28.7 % — ABNORMAL LOW (ref 36.0–46.0)
Hemoglobin: 9.3 g/dL — ABNORMAL LOW (ref 12.0–15.0)
MCH: 29.2 pg (ref 26.0–34.0)
MCHC: 32.4 g/dL (ref 30.0–36.0)
MCV: 90.3 fL (ref 80.0–100.0)
Platelets: 381 10*3/uL (ref 150–400)
RBC: 3.18 MIL/uL — ABNORMAL LOW (ref 3.87–5.11)
RDW: 14.3 % (ref 11.5–15.5)
WBC: 9.8 10*3/uL (ref 4.0–10.5)
nRBC: 0 % (ref 0.0–0.2)

## 2020-10-22 LAB — CBG MONITORING, ED
Glucose-Capillary: 187 mg/dL — ABNORMAL HIGH (ref 70–99)
Glucose-Capillary: 272 mg/dL — ABNORMAL HIGH (ref 70–99)
Glucose-Capillary: 194 mg/dL — ABNORMAL HIGH (ref 70–99)

## 2020-10-22 LAB — URINALYSIS, COMPLETE (UACMP) WITH MICROSCOPIC
Bacteria, UA: NONE SEEN
Bilirubin Urine: NEGATIVE
Glucose, UA: 150 mg/dL — AB
Hgb urine dipstick: NEGATIVE
Ketones, ur: NEGATIVE mg/dL
Nitrite: NEGATIVE
Protein, ur: >=300 mg/dL — AB
Specific Gravity, Urine: 1.007 (ref 1.005–1.030)
pH: 7 (ref 5.0–8.0)

## 2020-10-22 LAB — BASIC METABOLIC PANEL
Anion gap: 11 (ref 5–15)
BUN: 43 mg/dL — ABNORMAL HIGH (ref 6–20)
CO2: 21 mmol/L — ABNORMAL LOW (ref 22–32)
Calcium: 8.3 mg/dL — ABNORMAL LOW (ref 8.9–10.3)
Chloride: 111 mmol/L (ref 98–111)
Creatinine, Ser: 3.37 mg/dL — ABNORMAL HIGH (ref 0.44–1.00)
GFR, Estimated: 15 mL/min — ABNORMAL LOW (ref >60)
Glucose, Bld: 261 mg/dL — ABNORMAL HIGH (ref 70–99)
Potassium: 3.3 mmol/L — ABNORMAL LOW (ref 3.5–5.1)
Sodium: 143 mmol/L (ref 135–145)

## 2020-10-22 LAB — RESPIRATORY PANEL BY RT PCR (FLU A&B, COVID)
Influenza A by PCR: NEGATIVE
Influenza B by PCR: NEGATIVE
SARS Coronavirus 2 by RT PCR: NEGATIVE

## 2020-10-22 LAB — TROPONIN I (HIGH SENSITIVITY): Troponin I (High Sensitivity): 19 ng/L — ABNORMAL HIGH (ref <18)

## 2020-10-22 LAB — GLUCOSE, CAPILLARY
Glucose-Capillary: 302 mg/dL — ABNORMAL HIGH (ref 70–99)
Glucose-Capillary: 261 mg/dL — ABNORMAL HIGH (ref 70–99)

## 2020-10-22 LAB — MAGNESIUM: Magnesium: 1.8 mg/dL (ref 1.7–2.4)

## 2020-10-22 MED ORDER — INSULIN ASPART 100 UNIT/ML ~~LOC~~ SOLN
0.0000 [IU] | Freq: Three times a day (TID) | SUBCUTANEOUS | Status: DC
  Administered 2020-10-22: 5 [IU] via SUBCUTANEOUS
  Administered 2020-10-22: 2 [IU] via SUBCUTANEOUS
  Administered 2020-10-22: 7 [IU] via SUBCUTANEOUS
  Administered 2020-10-23 (×2): 2 [IU] via SUBCUTANEOUS
  Administered 2020-10-23: 1 [IU] via SUBCUTANEOUS
  Administered 2020-10-24 (×3): 2 [IU] via SUBCUTANEOUS
  Administered 2020-10-25: 3 [IU] via SUBCUTANEOUS
  Administered 2020-10-25: 8 [IU] via SUBCUTANEOUS
  Administered 2020-10-25 – 2020-10-26 (×4): 7 [IU] via SUBCUTANEOUS
  Administered 2020-10-27 (×2): 9 [IU] via SUBCUTANEOUS
  Filled 2020-10-22 (×17): qty 1

## 2020-10-22 MED ORDER — CARVEDILOL 6.25 MG PO TABS
25.0000 mg | ORAL_TABLET | Freq: Two times a day (BID) | ORAL | Status: DC
  Administered 2020-10-22: 25 mg via ORAL
  Filled 2020-10-22: qty 1

## 2020-10-22 MED ORDER — HEPARIN SODIUM (PORCINE) 5000 UNIT/ML IJ SOLN
5000.0000 [IU] | Freq: Three times a day (TID) | INTRAMUSCULAR | Status: DC
  Administered 2020-10-22 – 2020-10-28 (×21): 5000 [IU] via SUBCUTANEOUS
  Filled 2020-10-22 (×22): qty 1

## 2020-10-22 MED ORDER — SODIUM CHLORIDE 0.9% FLUSH
3.0000 mL | Freq: Two times a day (BID) | INTRAVENOUS | Status: DC
  Administered 2020-10-22 – 2020-10-28 (×14): 3 mL via INTRAVENOUS

## 2020-10-22 MED ORDER — PANTOPRAZOLE SODIUM 40 MG PO TBEC
40.0000 mg | DELAYED_RELEASE_TABLET | Freq: Every day | ORAL | Status: DC
  Administered 2020-10-22: 40 mg via ORAL
  Filled 2020-10-22: qty 1

## 2020-10-22 MED ORDER — INFLUENZA VAC SPLIT QUAD 0.5 ML IM SUSY: 0.5000 mL | PREFILLED_SYRINGE | INTRAMUSCULAR | Status: DC

## 2020-10-22 MED ORDER — CYCLOBENZAPRINE HCL 10 MG PO TABS
5.0000 mg | ORAL_TABLET | Freq: Two times a day (BID) | ORAL | Status: DC | PRN
  Administered 2020-10-22 – 2020-10-25 (×8): 5 mg via ORAL
  Filled 2020-10-22 (×9): qty 1

## 2020-10-22 MED ORDER — AMLODIPINE BESYLATE 5 MG PO TABS
10.0000 mg | ORAL_TABLET | Freq: Every day | ORAL | Status: DC
  Administered 2020-10-22: 10 mg via ORAL
  Filled 2020-10-22: qty 2

## 2020-10-22 MED ORDER — ONDANSETRON HCL 4 MG/2ML IJ SOLN: 4.0000 mg | Freq: Four times a day (QID) | INTRAMUSCULAR | Status: DC | PRN

## 2020-10-22 MED ORDER — ONDANSETRON HCL 4 MG PO TABS
4.0000 mg | ORAL_TABLET | Freq: Four times a day (QID) | ORAL | Status: DC | PRN
  Administered 2020-10-23: 4 mg via ORAL
  Filled 2020-10-22: qty 1

## 2020-10-22 MED ORDER — ISOSORBIDE MONONITRATE ER 60 MG PO TB24
30.0000 mg | ORAL_TABLET | Freq: Every day | ORAL | Status: DC
  Administered 2020-10-22: 30 mg via ORAL
  Filled 2020-10-22: qty 1

## 2020-10-22 MED ORDER — OXYCODONE-ACETAMINOPHEN 7.5-325 MG PO TABS
1.0000 | ORAL_TABLET | Freq: Four times a day (QID) | ORAL | Status: DC | PRN
  Administered 2020-10-22 – 2020-10-23 (×2): 1 via ORAL
  Filled 2020-10-22 (×2): qty 1

## 2020-10-22 MED ORDER — ATORVASTATIN CALCIUM 80 MG PO TABS
80.0000 mg | ORAL_TABLET | Freq: Every day | ORAL | Status: DC
  Administered 2020-10-22: 80 mg via ORAL
  Filled 2020-10-22: qty 4

## 2020-10-22 MED ORDER — ASPIRIN EC 81 MG PO TBEC
81.0000 mg | DELAYED_RELEASE_TABLET | Freq: Every day | ORAL | Status: DC
  Administered 2020-10-22: 81 mg via ORAL
  Filled 2020-10-22: qty 1

## 2020-10-22 MED ORDER — FUROSEMIDE 10 MG/ML IJ SOLN
40.0000 mg | Freq: Two times a day (BID) | INTRAMUSCULAR | Status: DC
  Administered 2020-10-22 – 2020-10-23 (×3): 40 mg via INTRAVENOUS
  Filled 2020-10-22 (×3): qty 4

## 2020-10-22 MED ORDER — SODIUM BICARBONATE 650 MG PO TABS
1300.0000 mg | ORAL_TABLET | Freq: Three times a day (TID) | ORAL | Status: DC
  Administered 2020-10-22 – 2020-10-24 (×7): 1300 mg via ORAL
  Filled 2020-10-22 (×8): qty 2

## 2020-10-22 MED ORDER — ACETAMINOPHEN 325 MG PO TABS
650.0000 mg | ORAL_TABLET | Freq: Four times a day (QID) | ORAL | Status: DC | PRN
  Administered 2020-10-22 – 2020-10-28 (×4): 650 mg via ORAL
  Filled 2020-10-22 (×5): qty 2

## 2020-10-22 MED ORDER — ACETAMINOPHEN 650 MG RE SUPP: 650.0000 mg | Freq: Four times a day (QID) | RECTAL | Status: DC | PRN

## 2020-10-22 MED ORDER — MOMETASONE FURO-FORMOTEROL FUM 200-5 MCG/ACT IN AERO: 2.0000 | INHALATION_SPRAY | Freq: Two times a day (BID) | RESPIRATORY_TRACT | Status: DC

## 2020-10-22 MED ORDER — INSULIN ASPART 100 UNIT/ML ~~LOC~~ SOLN
0.0000 [IU] | Freq: Every day | SUBCUTANEOUS | Status: DC
  Administered 2020-10-22: 3 [IU] via SUBCUTANEOUS
  Administered 2020-10-24: 4 [IU] via SUBCUTANEOUS
  Administered 2020-10-25: 3 [IU] via SUBCUTANEOUS
  Administered 2020-10-26: 4 [IU] via SUBCUTANEOUS
  Filled 2020-10-22 (×5): qty 1

## 2020-10-22 MED ORDER — ALBUTEROL SULFATE (2.5 MG/3ML) 0.083% IN NEBU: 2.5000 mg | INHALATION_SOLUTION | Freq: Four times a day (QID) | RESPIRATORY_TRACT | Status: DC | PRN

## 2020-10-22 MED ORDER — MOMETASONE FURO-FORMOTEROL FUM 100-5 MCG/ACT IN AERO
2.0000 | INHALATION_SPRAY | Freq: Two times a day (BID) | RESPIRATORY_TRACT | Status: DC
  Administered 2020-10-22 – 2020-10-28 (×11): 2 via RESPIRATORY_TRACT
  Filled 2020-10-22 (×2): qty 8.8

## 2020-10-22 NOTE — Progress Notes (Signed)
PROGRESS NOTE    Terry Arroyo  RCV:[id number] DOB: Jan 07, 1961 DOA: 10/21/2020 PCP: Kirk Ruths, MD    Assessment & Plan:   Principal Problem:   Acute on chronic systolic congestive heart failure ([place]) Active Problems:   DM2 (diabetes mellitus, type 2) (HCC)   CKD (chronic kidney disease) stage 4, GFR 15-29 ml/min (HCC)   Hypertension associated with diabetes ([place])   CAD (coronary artery disease)   Hyperlipidemia associated with type 2 diabetes mellitus (HCC)   OSA (obstructive sleep apnea)    Acute on chronic systolic CHF exacerbation: echo showed EF 30-35% 02/27/2020. Pulmonary edema with bilateral pleural effusions seen on chest x-ray. Continue on IV lasix. Strict I/Os & daily weights. Initially required BiPAP and now currently on 2L [place]. Not on ACE/ARB/spironolactone due to renal dysfunction  CAD: s/p CABG. Continue on coreg, statin, aspirin & imdur   COPD: w/o exacerbation. Continue on bronchodilators & encourage incentive spirometry   CKD IV: w/ hx of nephrotic syndrome. Cr is stable from day prior. Nephro consulted   Likely ACD: secondary to CKD. No need for a transfusion at this time. Will continue to monitor   Hypokalemia: will hold off replacing secondary CKDIV. Will continue to monitor   DM2: poorly controlled w/ HbA1c 8.6. Continue on SSI w/ accuchecks   HTN: continue on coreg, amlodipine, imdur   HLD: continue on statin   OSA: does not use CPAP at home     DVT prophylaxis: heparin  Code Status: full  Family Communication:  Disposition Plan: likely d/c back home.  Status is: Inpatient  Remains inpatient appropriate because:Ongoing diagnostic testing needed not appropriate for outpatient work up and IV treatments appropriate due to intensity of illness or inability to take PO   Dispo: The patient is from: Home              Anticipated d/c is to: Home              Anticipated d/c date is: 3 days              Patient currently is not  medically stable to d/c.         Consultants:   nephro    Procedures:   Antimicrobials:   Subjective: Pt c/o fatigue   Objective: Vitals:   10/22/20 0730 10/22/20 0800 10/22/20 0830 10/22/20 0841  BP: (!) 142/73 (!) 162/89 (!) 199/103 (!) 199/103  Pulse: 70 71 74   Resp: 16 17 (!) 28   Temp:      TempSrc:      SpO2: 99% 97% 98%   Weight:        Intake/Output Summary (Last 24 hours) at 10/22/2020 0848 Last data filed at 10/22/2020 0829 Gross per 24 hour  Intake --  Output 1650 ml  Net -1650 ml   Filed Weights   10/21/20 1927  Weight: 82.6 kg    Examination:  General exam: Appears anxious and tearful  Respiratory system: diminished breath sounds b/l  Cardiovascular system: S1 & S2 +. No rubs, gallops or clicks.  Gastrointestinal system: Abdomen is nondistended, soft and nontender. Normal bowel sounds heard. Central nervous system: Alert and oriented. Moves all 4 extremities  Psychiatry: Judgement and insight appear normal. Tearful      Data Reviewed: I have personally reviewed following labs and imaging studies  CBC: Recent Labs  Lab 10/21/20 1931 10/22/20 0500  WBC 12.2* 9.8  NEUTROABS 9.3*  --   HGB 10.0* 9.3*  HCT  31.1* 28.7*  MCV 92.0 90.3  PLT 376 428   Basic Metabolic Panel: Recent Labs  Lab 10/21/20 1931 10/22/20 0500  NA 142 143  K 3.7 3.3*  CL 110 111  CO2 18* 21*  GLUCOSE 222* 261*  BUN 44* 43*  CREATININE 3.37* 3.37*  CALCIUM 8.2* 8.3*  MG  --  1.8   GFR: Estimated Creatinine Clearance: 19.7 mL/min (A) (by C-G formula based on SCr of 3.37 mg/dL (H)). Liver Function Tests: Recent Labs  Lab 10/21/20 1931  AST 17  ALT 8  ALKPHOS 70  BILITOT 1.1  PROT 6.7  ALBUMIN 2.4*   No results for input(s): LIPASE, AMYLASE in the last 168 hours. No results for input(s): AMMONIA in the last 168 hours. Coagulation Profile: No results for input(s): INR, PROTIME in the last 168 hours. Cardiac Enzymes: No results for input(s):  CKTOTAL, CKMB, CKMBINDEX, TROPONINI in the last 168 hours. BNP (last 3 results) No results for input(s): PROBNP in the last 8760 hours. HbA1C: No results for input(s): HGBA1C in the last 72 hours. CBG: Recent Labs  Lab 10/22/20 0148 10/22/20 0831  GLUCAP 187* 272*   Lipid Profile: No results for input(s): CHOL, HDL, LDLCALC, TRIG, CHOLHDL, LDLDIRECT in the last 72 hours. Thyroid Function Tests: No results for input(s): TSH, T4TOTAL, FREET4, T3FREE, THYROIDAB in the last 72 hours. Anemia Panel: No results for input(s): VITAMINB12, FOLATE, FERRITIN, TIBC, IRON, RETICCTPCT in the last 72 hours. Sepsis Labs: Recent Labs  Lab 10/21/20 1931  LATICACIDVEN 1.1    Recent Results (from the past 240 hour(s))  Respiratory Panel by RT PCR (Flu A&B, Covid) - Nasopharyngeal Swab     Status: None   Collection Time: 10/21/20 11:51 PM   Specimen: Nasopharyngeal Swab  Result Value Ref Range Status   SARS Coronavirus 2 by RT PCR NEGATIVE NEGATIVE Final    Comment: (NOTE) SARS-CoV-2 target nucleic acids are NOT DETECTED.  The SARS-CoV-2 RNA is generally detectable in upper respiratoy specimens during the acute phase of infection. The lowest concentration of SARS-CoV-2 viral copies this assay can detect is 131 copies/mL. A negative result does not preclude SARS-Cov-2 infection and should not be used as the sole basis for treatment or other patient management decisions. A negative result may occur with  improper specimen collection/handling, submission of specimen other than nasopharyngeal swab, presence of viral mutation(s) within the areas targeted by this assay, and inadequate number of viral copies (<131 copies/mL). A negative result must be combined with clinical observations, patient history, and epidemiological information. The expected result is Negative.  Fact Sheet for Patients:  PinkCheek.be  Fact Sheet for Healthcare Providers:    GravelBags.it  This test is no t yet approved or cleared by the [place] FDA and  has been authorized for detection and/or diagnosis of SARS-CoV-2 by FDA under an Emergency Use Authorization (EUA). This EUA will remain  in effect (meaning this test can be used) for the duration of the COVID-19 declaration under Section 564(b)(1) of the Act, 21 U.S.C. section 360bbb-3(b)(1), unless the authorization is terminated or revoked sooner.     Influenza A by PCR NEGATIVE NEGATIVE Final   Influenza B by PCR NEGATIVE NEGATIVE Final    Comment: (NOTE) The Xpert Xpress SARS-CoV-2/FLU/RSV assay is intended as an aid in  the diagnosis of influenza from Nasopharyngeal swab specimens and  should not be used as a sole basis for treatment. Nasal washings and  aspirates are unacceptable for Xpert Xpress SARS-CoV-2/FLU/RSV  testing.  Fact Sheet for Patients: PinkCheek.be  Fact Sheet for Healthcare Providers: GravelBags.it  This test is not yet approved or cleared by the [place] FDA and  has been authorized for detection and/or diagnosis of SARS-CoV-2 by  FDA under an Emergency Use Authorization (EUA). This EUA will remain  in effect (meaning this test can be used) for the duration of the  Covid-19 declaration under Section 564(b)(1) of the Act, 21  U.S.C. section 360bbb-3(b)(1), unless the authorization is  terminated or revoked. Performed at [name], [address], [place], [place] [address]          Radiology Studies: DG Chest Portable 1 View  Result Date: 10/21/2020 CLINICAL DATA:  Shortness of breath for 1 week. EXAM: PORTABLE CHEST 1 VIEW COMPARISON:  10/10/2020 FINDINGS: Post median sternotomy and CABG mild cardiomegaly. Progressive bilateral pleural effusions, now moderate in degree. Fluid in the right minor fissure. Mild pulmonary edema. The previous nodular densities in  the left greater than right lung are not definitively seen and may be partially obscured on the current exam by pulmonary edema. No pneumothorax. No acute osseous abnormalities are seen. IMPRESSION: Mild cardiomegaly with pulmonary edema and bilateral pleural effusions, consistent with CHF. Electronically Signed   By: Keith Rake M.D.   On: 10/21/2020 19:35        Scheduled Meds: . amLODipine  10 mg Oral Daily  . aspirin EC  81 mg Oral Daily  . atorvastatin  80 mg Oral q1800  . carvedilol  25 mg Oral BID WC  . furosemide  40 mg Intravenous Q12H  . heparin  5,000 Units Subcutaneous Q8H  . insulin aspart  0-5 Units Subcutaneous QHS  . insulin aspart  0-9 Units Subcutaneous TID WC  . isosorbide mononitrate  30 mg Oral Daily  . mometasone-formoterol  2 puff Inhalation BID  . pantoprazole  40 mg Oral Daily  . sodium bicarbonate  1,300 mg Oral TID  . sodium chloride flush  3 mL Intravenous Q12H   Continuous Infusions:   LOS: 1 day    Time spent: 32 mins    Wyvonnia Dusky, MD [facility] Pager [phone]  If 7PM-7AM, please contact night-coverage [email or web address] 10/22/2020, 8:48 AM

## 2020-10-22 NOTE — ED Notes (Signed)
Hospitalist at bedside 

## 2020-10-22 NOTE — ED Notes (Signed)
Per Hospitalist take pt off oxygen . Pt oxygen saturation off oxygen , 96%

## 2020-10-22 NOTE — Progress Notes (Signed)
   Heart Failure Nurse Navigator Note  HFrEF 30-35% by echocardiogram performed 02/27/2020.  Normal right ventricular systolic function, Mild MR. Mild TR.  Initial visit.  Co morbidities:  Diabetes CKD stage IV Hypertension Coronary artery disease/ CABG Hyperlipidemia OSA- noncompliant with CPAP COPD     Medications:  Amlodipine 10 mg daily ASA 81 mg daily Lipitor 80 mg daily Coreg 25 mg BID Imdur 30 mg daily Lasix 40 mg IV every 8 hours   She is not on an ACE/ARB/MRA due to kidney function  Labs:  Sodium 143, potassium 3.3, BUN 43, creatinine 3.37, magnesium 1.8 hemoglobin 9.3, lactic acid 1.1, troponin 19, BNP 1,384.  Assessment:   General- she is awake and alert in the ED, currently on [place].  C/o  Left sided sharp back discomfort.  HEENT-no JVD, normocephalic  Cardiac- heart tones regular.  Chest-diminished in the bases, no wheezes or rhonci  Abdomen- soft non tender  Musculoskeletal- 1 + pitting edema  Psych- is pleasant and appropriate  Neuro- speech is clear.   Patient was interviewed in the ED, she currently denies any increasing SOB on 02 per nasal cannula.  She denies any chest pain or pressure, does complain of a sharp pain on the left side of her back.  Discussed how she takes care of her heart failure at home- she states she weighs herself daily, does not add salt to anything, uses [employer] for seasoning.  States she does drink a lot of water. Discussed limiting it to 2 liters in a 24 hour period.  Discussed what to report to doctor, to aid in keeping her out of the hospital, she voices understanding.  She states when she was discharged, her prescriptions were sent to the wrong pharmacy and they did not know her insurances and she did not have the $76 to pay for them, so she had gone without her medications, ending up in the hospital.   Given heart failure teaching booklet and zone magnet.  Pricilla Riffle RN, CHFN

## 2020-10-22 NOTE — Telephone Encounter (Signed)
CSW informed by [employer] [facility] that pt currently in the hospital but was supposed to receive shipment from [employer] today.  CSW called [employer] to inform- meals are out for delivery but they will try to cancel shipment and will place pt account on hold until we call to inform she is back home  Will continue to follow and assist as needed  Jorge Ny, [facility] Desk#: [phone] Cell#: [phone]

## 2020-10-22 NOTE — Progress Notes (Signed)
[facility]  ROUNDING NOTE   Subjective:   Terry Arroyo presented to the ED for worsening SOB, and dyspnea on exertion, has h/o CHF with EF 30 -35%,CKD Stage V, Diabetes and hypertension. She was admitted to the hospital recently for COPD exacerbation and pneumonia.  Objective:  Vital signs in last 24 hours:  Temp:  [98.4 F (36.9 C)] 98.4 F (36.9 C) (11/02 1926) Pulse Rate:  [57-74] 61 (11/03 1530) Resp:  [15-30] 19 (11/03 1530) BP: (107-202)/(53-110) 139/78 (11/03 1530) SpO2:  [94 %-100 %] 97 % (11/03 1530) Weight:  [82.6 kg] 82.6 kg (11/02 1927)  Weight change:  Filed Weights   10/21/20 1927  Weight: 82.6 kg    Intake/Output: I/O last 3 completed shifts: In: -  Out: 950 [Urine:950]   Intake/Output this shift:  Total I/O In: -  Out: 1200 [Urine:1200]  Physical Exam: General: No acute distress  Head: Normocephalic, atraumatic. Moist oral mucosal membranes  Eyes: Anicteric  Lungs:  Lungs with fine crackles bilaterally,increased work of breathing with exertion  Heart: Regular rate and rhythm  Abdomen:  Soft, nontender,   Extremities:  Trace  peripheral edema.  Neurologic: Oriented,  moving all four extremities  Skin: No acute lesions or rashes    Basic Metabolic Panel: Recent Labs  Lab 10/21/20 1931 10/22/20 0500  NA 142 143  K 3.7 3.3*  CL 110 111  CO2 18* 21*  GLUCOSE 222* 261*  BUN 44* 43*  CREATININE 3.37* 3.37*  CALCIUM 8.2* 8.3*  MG  --  1.8    Liver Function Tests: Recent Labs  Lab 10/21/20 1931  AST 17  ALT 8  ALKPHOS 70  BILITOT 1.1  PROT 6.7  ALBUMIN 2.4*   No results for input(s): LIPASE, AMYLASE in the last 168 hours. No results for input(s): AMMONIA in the last 168 hours.  CBC: Recent Labs  Lab 10/21/20 1931 10/22/20 0500  WBC 12.2* 9.8  NEUTROABS 9.3*  --   HGB 10.0* 9.3*  HCT 31.1* 28.7*  MCV 92.0 90.3  PLT 376 381    Cardiac Enzymes: No results for input(s): CKTOTAL, CKMB, CKMBINDEX, TROPONINI in the  last 168 hours.  BNP: Invalid input(s): POCBNP  CBG: Recent Labs  Lab 10/22/20 0148 10/22/20 0831 10/22/20 1218  GLUCAP 187* 272* 194*    Microbiology: Results for orders placed or performed during the hospital encounter of 10/21/20  Respiratory Panel by RT PCR (Flu A&B, Covid) - Nasopharyngeal Swab     Status: None   Collection Time: 10/21/20 11:51 PM   Specimen: Nasopharyngeal Swab  Result Value Ref Range Status   SARS Coronavirus 2 by RT PCR NEGATIVE NEGATIVE Final    Comment: (NOTE) SARS-CoV-2 target nucleic acids are NOT DETECTED.  The SARS-CoV-2 RNA is generally detectable in upper respiratoy specimens during the acute phase of infection. The lowest concentration of SARS-CoV-2 viral copies this assay can detect is 131 copies/mL. A negative result does not preclude SARS-Cov-2 infection and should not be used as the sole basis for treatment or other patient management decisions. A negative result may occur with  improper specimen collection/handling, submission of specimen other than nasopharyngeal swab, presence of viral mutation(s) within the areas targeted by this assay, and inadequate number of viral copies (<131 copies/mL). A negative result must be combined with clinical observations, patient history, and epidemiological information. The expected result is Negative.  Fact Sheet for Patients:  PinkCheek.be  Fact Sheet for Healthcare Providers:  GravelBags.it  This test is no  t yet approved or cleared by the [place] and  has been authorized for detection and/or diagnosis of SARS-CoV-2 by FDA under an Emergency Use Authorization (EUA). This EUA will remain  in effect (meaning this test can be used) for the duration of the COVID-19 declaration under Section 564(b)(1) of the Act, 21 U.S.C. section 360bbb-3(b)(1), unless the authorization is terminated or revoked sooner.     Influenza A by PCR  NEGATIVE NEGATIVE Final   Influenza B by PCR NEGATIVE NEGATIVE Final    Comment: (NOTE) The Xpert Xpress SARS-CoV-2/FLU/RSV assay is intended as an aid in  the diagnosis of influenza from Nasopharyngeal swab specimens and  should not be used as a sole basis for treatment. Nasal washings and  aspirates are unacceptable for Xpert Xpress SARS-CoV-2/FLU/RSV  testing.  Fact Sheet for Patients: PinkCheek.be  Fact Sheet for Healthcare Providers: GravelBags.it  This test is not yet approved or cleared by the [place] FDA and  has been authorized for detection and/or diagnosis of SARS-CoV-2 by  FDA under an Emergency Use Authorization (EUA). This EUA will remain  in effect (meaning this test can be used) for the duration of the  Covid-19 declaration under Section 564(b)(1) of the Act, 21  U.S.C. section 360bbb-3(b)(1), unless the authorization is  terminated or revoked. Performed at [facility], [address] [place], [place] [address]     Coagulation Studies: No results for input(s): LABPROT, INR in the last 72 hours.  Urinalysis: Recent Labs    10/21/20 0829  COLORURINE STRAW*  LABSPEC 1.007  PHURINE 7.0  GLUCOSEU 150*  HGBUR NEGATIVE  BILIRUBINUR NEGATIVE  KETONESUR NEGATIVE  PROTEINUR >=300*  NITRITE NEGATIVE  LEUKOCYTESUR SMALL*      Imaging: DG Chest Portable 1 View  Result Date: 10/21/2020 CLINICAL DATA:  Shortness of breath for 1 week. EXAM: PORTABLE CHEST 1 VIEW COMPARISON:  10/10/2020 FINDINGS: Post median sternotomy and CABG mild cardiomegaly. Progressive bilateral pleural effusions, now moderate in degree. Fluid in the right minor fissure. Mild pulmonary edema. The previous nodular densities in the left greater than right lung are not definitively seen and may be partially obscured on the current exam by pulmonary edema. No pneumothorax. No acute osseous abnormalities are seen. IMPRESSION:  Mild cardiomegaly with pulmonary edema and bilateral pleural effusions, consistent with CHF. Electronically Signed   By: Keith Rake M.D.   On: 10/21/2020 19:35     Medications:    . furosemide  40 mg Intravenous Q12H  . heparin  5,000 Units Subcutaneous Q8H  . insulin aspart  0-5 Units Subcutaneous QHS  . insulin aspart  0-9 Units Subcutaneous TID WC  . mometasone-formoterol  2 puff Inhalation BID  . sodium bicarbonate  1,300 mg Oral TID  . sodium chloride flush  3 mL Intravenous Q12H   acetaminophen **OR** acetaminophen, albuterol, cyclobenzaprine, ondansetron **OR** ondansetron (ZOFRAN) IV  Assessment/ Plan:  Terry Arroyo is a 59 y.o.  female presented to the ED for worsening SOB, and dyspnea on exertion, has h/o CHF with EF 30 -35%,CKD Stage IV worsening to V, Diabetes and hypertension. She was admitted to the hospital recently for COPD exacerbation and pneumonia.  # CKD Stage IV  Baseline Creatinine 3.14 on 09/15/2020 with GFR 18  Lab Results  Component Value Date   CREATININE 3.37 (H) 10/22/2020   CREATININE 3.37 (H) 10/21/2020   CREATININE 3.87 (H) 10/12/2020  GFR 15 today BUN  43 Renal function worsening progressively overtime We will continue  to monitor closely Hold all Nephrotoxic agents Patient tearful when talking about possible dialysis in future No acute indication for dialysis today  #Diabetes with CKD Blood glucose readings above goal On Insulin Aspart  #Hypertension Blood Pressure above goal 160/75 On Amlodipine and Coreg  Patient is on Furosemide 40mg  Q12hours Received topical Nitroglycerin in ED  # Acute on chronic systolic CHF exacerbation: EF 30-35% from TTE on 02/27/2020 Patient is on Furosemide as above  SpO2 98% on RA,patient has nasal cannula off of her nostril on assessment  LOS: 1 [place] 11/3/20214:08 PM

## 2020-10-22 NOTE — ED Notes (Signed)
Pt taken off BiPAP per Dr. Posey Pronto.

## 2020-10-23 DIAGNOSIS — R531 Weakness: Secondary | ICD-10-CM | POA: Diagnosis not present

## 2020-10-23 DIAGNOSIS — I5020 Unspecified systolic (congestive) heart failure: Secondary | ICD-10-CM | POA: Diagnosis not present

## 2020-10-23 DIAGNOSIS — I2510 Atherosclerotic heart disease of native coronary artery without angina pectoris: Secondary | ICD-10-CM | POA: Diagnosis not present

## 2020-10-23 DIAGNOSIS — E1169 Type 2 diabetes mellitus with other specified complication: Secondary | ICD-10-CM | POA: Diagnosis not present

## 2020-10-23 LAB — CBC
HCT: 25.7 % — ABNORMAL LOW (ref 36.0–46.0)
Hemoglobin: 8.5 g/dL — ABNORMAL LOW (ref 12.0–15.0)
MCH: 29.5 pg (ref 26.0–34.0)
MCHC: 33.1 g/dL (ref 30.0–36.0)
MCV: 89.2 fL (ref 80.0–100.0)
Platelets: 374 10*3/uL (ref 150–400)
RBC: 2.88 MIL/uL — ABNORMAL LOW (ref 3.87–5.11)
RDW: 14.4 % (ref 11.5–15.5)
WBC: 10 10*3/uL (ref 4.0–10.5)
nRBC: 0 % (ref 0.0–0.2)

## 2020-10-23 LAB — BASIC METABOLIC PANEL
Anion gap: 8 (ref 5–15)
BUN: 46 mg/dL — ABNORMAL HIGH (ref 6–20)
CO2: 23 mmol/L (ref 22–32)
Calcium: 7.9 mg/dL — ABNORMAL LOW (ref 8.9–10.3)
Chloride: 110 mmol/L (ref 98–111)
Creatinine, Ser: 3.69 mg/dL — ABNORMAL HIGH (ref 0.44–1.00)
GFR, Estimated: 14 mL/min — ABNORMAL LOW (ref >60)
Glucose, Bld: 196 mg/dL — ABNORMAL HIGH (ref 70–99)
Potassium: 3.2 mmol/L — ABNORMAL LOW (ref 3.5–5.1)
Sodium: 141 mmol/L (ref 135–145)

## 2020-10-23 LAB — GLUCOSE, CAPILLARY
Glucose-Capillary: 182 mg/dL — ABNORMAL HIGH (ref 70–99)
Glucose-Capillary: 158 mg/dL — ABNORMAL HIGH (ref 70–99)
Glucose-Capillary: 147 mg/dL — ABNORMAL HIGH (ref 70–99)
Glucose-Capillary: 153 mg/dL — ABNORMAL HIGH (ref 70–99)

## 2020-10-23 MED ORDER — MORPHINE SULFATE (PF) 2 MG/ML IV SOLN
1.0000 mg | INTRAVENOUS | Status: DC | PRN
  Administered 2020-10-23 – 2020-10-25 (×7): 1 mg via INTRAVENOUS
  Filled 2020-10-23 (×7): qty 1

## 2020-10-23 MED ORDER — OXYCODONE HCL 5 MG PO TABS
5.0000 mg | ORAL_TABLET | Freq: Four times a day (QID) | ORAL | Status: DC | PRN
  Administered 2020-10-23 – 2020-10-24 (×4): 5 mg via ORAL
  Filled 2020-10-23 (×4): qty 1

## 2020-10-23 NOTE — Progress Notes (Signed)
PROGRESS NOTE    EQUILLA QUE  OQH:[id number] DOB: 05-20-1961 DOA: 10/21/2020 PCP: Kirk Ruths, MD    Assessment & Plan:   Principal Problem:   Acute on chronic systolic congestive heart failure ([place]) Active Problems:   DM2 (diabetes mellitus, type 2) (HCC)   CKD (chronic kidney disease) stage 4, GFR 15-29 ml/min (HCC)   Hypertension associated with diabetes ([place])   CAD (coronary artery disease)   Hyperlipidemia associated with type 2 diabetes mellitus ([place])   OSA (obstructive sleep apnea)  AKI on CKD IV: w/ hx of nephrotic syndrome. Cr is trending up from day prior. Hold lasix, coreg, imdur, aspirin and any other nephrotoxic meds as pt's kidney biopsy showed hypersensitivity reaction   Acute on chronic systolic CHF exacerbation: echo showed EF 30-35% 02/27/2020. Pulmonary edema with bilateral pleural effusions seen on chest x-ray. Strict I/Os & daily weights. Initially required BiPAP and now currently on 2L [place]. Hold lasix secondary to AKI on CKD. Not on ACE/ARB/spironolactone due to renal dysfunction  CAD: s/p CABG. Continue to hold home dose of aspirin, imdur, statin, coreg as pt's kidney biopsy showed hypersensitivity   COPD: w/o exacerbation. Continue on bronchodilators & encourage incentive spirometry   Likely ACD: likely secondary to CKD. No need for a transfusion at this time   Hypokalemia: will hold off replacing secondary AKI on CKDIV. Nephro is following   DM2: poorly controlled w/ HbA1c 8.6. Continue on SSI w/ accuchecks   HTN: continue on hold anti-HTN secondary to pt's kidney biopsy showing hypersensitivity reaction   HLD: continue to hold statin   OSA: does not use CPAP at home     DVT prophylaxis: heparin  Code Status: full  Family Communication:  Disposition Plan: likely d/c back home.  Status is: Inpatient  Remains inpatient appropriate because:Ongoing diagnostic testing needed not appropriate for outpatient work up and IV treatments  appropriate due to intensity of illness or inability to take PO   Dispo: The patient is from: Home              Anticipated d/c is to: Home              Anticipated d/c date is: 3 days              Patient currently is not medically stable to d/c.         Consultants:   nephro    Procedures:   Antimicrobials:   Subjective: Pt c/o back pain  Objective: Vitals:   10/22/20 1941 10/22/20 2100 10/23/20 0154 10/23/20 0412  BP: (!) 171/79 (!) 170/85  (!) 147/81  Pulse: (!) 59 68  (!) 57  Resp: 18 18  18   Temp: 98.3 F (36.8 C) 98 F (36.7 C)  98 F (36.7 C)  TempSrc: Oral Oral  Oral  SpO2: 100% 95%  99%  Weight:   87.1 kg     Intake/Output Summary (Last 24 hours) at 10/23/2020 0718 Last data filed at 10/22/2020 2233 Gross per 24 hour  Intake 120 ml  Output 2650 ml  Net -2530 ml   Filed Weights   10/21/20 1927 10/23/20 0154  Weight: 82.6 kg 87.1 kg    Examination:  General exam: Appears calm but uncomfortable  Respiratory system: decreased breath sounds b/l otherwise clear Cardiovascular system: S1 & S2 +. No rubs, gallops or clicks.  Gastrointestinal system: Abdomen is nondistended, soft and nontender. Normal bowel sounds heard. Central nervous system: Alert & oriented. Moves all 4 extremities  Psychiatry: Judgement and insight appear normal. Flat mood and affect     Data Reviewed: I have personally reviewed following labs and imaging studies  CBC: Recent Labs  Lab 10/21/20 1931 10/22/20 0500 10/23/20 0350  WBC 12.2* 9.8 10.0  NEUTROABS 9.3*  --   --   HGB 10.0* 9.3* 8.5*  HCT 31.1* 28.7* 25.7*  MCV 92.0 90.3 89.2  PLT 376 381 350   Basic Metabolic Panel: Recent Labs  Lab 10/21/20 1931 10/22/20 0500 10/23/20 0350  NA 142 143 141  K 3.7 3.3* 3.2*  CL 110 111 110  CO2 18* 21* 23  GLUCOSE 222* 261* 196*  BUN 44* 43* 46*  CREATININE 3.37* 3.37* 3.69*  CALCIUM 8.2* 8.3* 7.9*  MG  --  1.8  --    GFR: Estimated Creatinine Clearance:  18.5 mL/min (A) (by C-G formula based on SCr of 3.69 mg/dL (H)). Liver Function Tests: Recent Labs  Lab 10/21/20 1931  AST 17  ALT 8  ALKPHOS 70  BILITOT 1.1  PROT 6.7  ALBUMIN 2.4*   No results for input(s): LIPASE, AMYLASE in the last 168 hours. No results for input(s): AMMONIA in the last 168 hours. Coagulation Profile: No results for input(s): INR, PROTIME in the last 168 hours. Cardiac Enzymes: No results for input(s): CKTOTAL, CKMB, CKMBINDEX, TROPONINI in the last 168 hours. BNP (last 3 results) No results for input(s): PROBNP in the last 8760 hours. HbA1C: No results for input(s): HGBA1C in the last 72 hours. CBG: Recent Labs  Lab 10/22/20 0148 10/22/20 0831 10/22/20 1218 10/22/20 1815 10/22/20 1943  GLUCAP 187* 272* 194* 302* 261*   Lipid Profile: No results for input(s): CHOL, HDL, LDLCALC, TRIG, CHOLHDL, LDLDIRECT in the last 72 hours. Thyroid Function Tests: No results for input(s): TSH, T4TOTAL, FREET4, T3FREE, THYROIDAB in the last 72 hours. Anemia Panel: No results for input(s): VITAMINB12, FOLATE, FERRITIN, TIBC, IRON, RETICCTPCT in the last 72 hours. Sepsis Labs: Recent Labs  Lab 10/21/20 1931  LATICACIDVEN 1.1    Recent Results (from the past 240 hour(s))  Respiratory Panel by RT PCR (Flu A&B, Covid) - Nasopharyngeal Swab     Status: None   Collection Time: 10/21/20 11:51 PM   Specimen: Nasopharyngeal Swab  Result Value Ref Range Status   SARS Coronavirus 2 by RT PCR NEGATIVE NEGATIVE Final    Comment: (NOTE) SARS-CoV-2 target nucleic acids are NOT DETECTED.  The SARS-CoV-2 RNA is generally detectable in upper respiratoy specimens during the acute phase of infection. The lowest concentration of SARS-CoV-2 viral copies this assay can detect is 131 copies/mL. A negative result does not preclude SARS-Cov-2 infection and should not be used as the sole basis for treatment or other patient management decisions. A negative result may occur with    improper specimen collection/handling, submission of specimen other than nasopharyngeal swab, presence of viral mutation(s) within the areas targeted by this assay, and inadequate number of viral copies (<131 copies/mL). A negative result must be combined with clinical observations, patient history, and epidemiological information. The expected result is Negative.  Fact Sheet for Patients:  PinkCheek.be  Fact Sheet for Healthcare Providers:  GravelBags.it  This test is no t yet approved or cleared by the [place] FDA and  has been authorized for detection and/or diagnosis of SARS-CoV-2 by FDA under an Emergency Use Authorization (EUA). This EUA will remain  in effect (meaning this test can be used) for the duration of the COVID-19 declaration under Section 564(b)(1) of the Act,  21 U.S.C. section 360bbb-3(b)(1), unless the authorization is terminated or revoked sooner.     Influenza A by PCR NEGATIVE NEGATIVE Final   Influenza B by PCR NEGATIVE NEGATIVE Final    Comment: (NOTE) The Xpert Xpress SARS-CoV-2/FLU/RSV assay is intended as an aid in  the diagnosis of influenza from Nasopharyngeal swab specimens and  should not be used as a sole basis for treatment. Nasal washings and  aspirates are unacceptable for Xpert Xpress SARS-CoV-2/FLU/RSV  testing.  Fact Sheet for Patients: PinkCheek.be  Fact Sheet for Healthcare Providers: GravelBags.it  This test is not yet approved or cleared by the [place] FDA and  has been authorized for detection and/or diagnosis of SARS-CoV-2 by  FDA under an Emergency Use Authorization (EUA). This EUA will remain  in effect (meaning this test can be used) for the duration of the  Covid-19 declaration under Section 564(b)(1) of the Act, 21  U.S.C. section 360bbb-3(b)(1), unless the authorization is  terminated or  revoked. Performed at [facility], [address] [place], [place] [address]          Radiology Studies: DG Chest Portable 1 View  Result Date: 10/21/2020 CLINICAL DATA:  Shortness of breath for 1 week. EXAM: PORTABLE CHEST 1 VIEW COMPARISON:  10/10/2020 FINDINGS: Post median sternotomy and CABG mild cardiomegaly. Progressive bilateral pleural effusions, now moderate in degree. Fluid in the right minor fissure. Mild pulmonary edema. The previous nodular densities in the left greater than right lung are not definitively seen and may be partially obscured on the current exam by pulmonary edema. No pneumothorax. No acute osseous abnormalities are seen. IMPRESSION: Mild cardiomegaly with pulmonary edema and bilateral pleural effusions, consistent with CHF. Electronically Signed   By: Keith Rake M.D.   On: 10/21/2020 19:35        Scheduled Meds: . furosemide  40 mg Intravenous Q12H  . heparin  5,000 Units Subcutaneous Q8H  . influenza vac split quadrivalent PF  0.5 mL Intramuscular Tomorrow-1000  . insulin aspart  0-5 Units Subcutaneous QHS  . insulin aspart  0-9 Units Subcutaneous TID WC  . mometasone-formoterol  2 puff Inhalation BID  . sodium bicarbonate  1,300 mg Oral TID  . sodium chloride flush  3 mL Intravenous Q12H   Continuous Infusions:   LOS: 2 days    Time spent: 30 mins    Wyvonnia Dusky, MD [facility] Pager [phone]  If 7PM-7AM, please contact night-coverage [email or web address] 10/23/2020, 7:18 AM

## 2020-10-23 NOTE — Progress Notes (Signed)
[facility]  ROUNDING NOTE   Subjective:   UOP 2679mL.   Creatinine 3.69 (3.37)  Complains of nausea/vomiting and back pain  Objective:  Vital signs in last 24 hours:  Temp:  [98 F (36.7 C)-98.4 F (36.9 C)] 98.4 F (36.9 C) (11/04 0819) Pulse Rate:  [57-68] 65 (11/04 0819) Resp:  [15-24] 18 (11/04 0819) BP: (107-172)/(69-87) 172/72 (11/04 0819) SpO2:  [94 %-100 %] 100 % (11/04 0819) Weight:  [87.1 kg] 87.1 kg (11/04 0154)  Weight change: 4.545 kg Filed Weights   10/21/20 1927 10/23/20 0154  Weight: 82.6 kg 87.1 kg    Intake/Output: I/O last 3 completed shifts: In: 120 [P.O.:120] Out: 3600 [Urine:3600]   Intake/Output this shift:  Total I/O In: 240 [P.O.:240] Out: 700 [Urine:400; Emesis/NG output:300]  Physical Exam: General: No acute distress  Head: Normocephalic, atraumatic. Moist oral mucosal membranes  Eyes: Anicteric  Lungs:  Bilateral crackles   Heart: Regular rate and rhythm  Abdomen:  Soft, nontender,   Extremities:  +peripheral edema.  Neurologic: Oriented,  moving all four extremities  Skin: No acute lesions or rashes    Basic Metabolic Panel: Recent Labs  Lab 10/21/20 1931 10/22/20 0500 10/23/20 0350  NA 142 143 141  K 3.7 3.3* 3.2*  CL 110 111 110  CO2 18* 21* 23  GLUCOSE 222* 261* 196*  BUN 44* 43* 46*  CREATININE 3.37* 3.37* 3.69*  CALCIUM 8.2* 8.3* 7.9*  MG  --  1.8  --     Liver Function Tests: Recent Labs  Lab 10/21/20 1931  AST 17  ALT 8  ALKPHOS 70  BILITOT 1.1  PROT 6.7  ALBUMIN 2.4*   No results for input(s): LIPASE, AMYLASE in the last 168 hours. No results for input(s): AMMONIA in the last 168 hours.  CBC: Recent Labs  Lab 10/21/20 1931 10/22/20 0500 10/23/20 0350  WBC 12.2* 9.8 10.0  NEUTROABS 9.3*  --   --   HGB 10.0* 9.3* 8.5*  HCT 31.1* 28.7* 25.7*  MCV 92.0 90.3 89.2  PLT 376 381 374    Cardiac Enzymes: No results for input(s): CKTOTAL, CKMB, CKMBINDEX, TROPONINI in the last 168  hours.  BNP: Invalid input(s): POCBNP  CBG: Recent Labs  Lab 10/22/20 0831 10/22/20 1218 10/22/20 1815 10/22/20 1943 10/23/20 0816  GLUCAP 272* 194* 302* 261* 182*    Microbiology: Results for orders placed or performed during the hospital encounter of 10/21/20  Respiratory Panel by RT PCR (Flu A&B, Covid) - Nasopharyngeal Swab     Status: None   Collection Time: 10/21/20 11:51 PM   Specimen: Nasopharyngeal Swab  Result Value Ref Range Status   SARS Coronavirus 2 by RT PCR NEGATIVE NEGATIVE Final    Comment: (NOTE) SARS-CoV-2 target nucleic acids are NOT DETECTED.  The SARS-CoV-2 RNA is generally detectable in upper respiratoy specimens during the acute phase of infection. The lowest concentration of SARS-CoV-2 viral copies this assay can detect is 131 copies/mL. A negative result does not preclude SARS-Cov-2 infection and should not be used as the sole basis for treatment or other patient management decisions. A negative result may occur with  improper specimen collection/handling, submission of specimen other than nasopharyngeal swab, presence of viral mutation(s) within the areas targeted by this assay, and inadequate number of viral copies (<131 copies/mL). A negative result must be combined with clinical observations, patient history, and epidemiological information. The expected result is Negative.  Fact Sheet for Patients:  PinkCheek.be  Fact Sheet for Healthcare Providers:  GravelBags.it  This test is no t yet approved or cleared by the [place] and  has been authorized for detection and/or diagnosis of SARS-CoV-2 by FDA under an Emergency Use Authorization (EUA). This EUA will remain  in effect (meaning this test can be used) for the duration of the COVID-19 declaration under Section 564(b)(1) of the Act, 21 U.S.C. section 360bbb-3(b)(1), unless the authorization is terminated or revoked  sooner.     Influenza A by PCR NEGATIVE NEGATIVE Final   Influenza B by PCR NEGATIVE NEGATIVE Final    Comment: (NOTE) The Xpert Xpress SARS-CoV-2/FLU/RSV assay is intended as an aid in  the diagnosis of influenza from Nasopharyngeal swab specimens and  should not be used as a sole basis for treatment. Nasal washings and  aspirates are unacceptable for Xpert Xpress SARS-CoV-2/FLU/RSV  testing.  Fact Sheet for Patients: PinkCheek.be  Fact Sheet for Healthcare Providers: GravelBags.it  This test is not yet approved or cleared by the [place] FDA and  has been authorized for detection and/or diagnosis of SARS-CoV-2 by  FDA under an Emergency Use Authorization (EUA). This EUA will remain  in effect (meaning this test can be used) for the duration of the  Covid-19 declaration under Section 564(b)(1) of the Act, 21  U.S.C. section 360bbb-3(b)(1), unless the authorization is  terminated or revoked. Performed at [facility], [address] [place],  [address]     Coagulation Studies: No results for input(s): LABPROT, INR in the last 72 hours.  Urinalysis: Recent Labs    10/21/20 0829  COLORURINE STRAW*  LABSPEC 1.007  PHURINE 7.0  GLUCOSEU 150*  HGBUR NEGATIVE  BILIRUBINUR NEGATIVE  KETONESUR NEGATIVE  PROTEINUR >=300*  NITRITE NEGATIVE  LEUKOCYTESUR SMALL*      Imaging: DG Chest Portable 1 View  Result Date: 10/21/2020 CLINICAL DATA:  Shortness of breath for 1 week. EXAM: PORTABLE CHEST 1 VIEW COMPARISON:  10/10/2020 FINDINGS: Post median sternotomy and CABG mild cardiomegaly. Progressive bilateral pleural effusions, now moderate in degree. Fluid in the right minor fissure. Mild pulmonary edema. The previous nodular densities in the left greater than right lung are not definitively seen and may be partially obscured on the current exam by pulmonary edema. No pneumothorax. No acute osseous  abnormalities are seen. IMPRESSION: Mild cardiomegaly with pulmonary edema and bilateral pleural effusions, consistent with CHF. Electronically Signed   By: Keith Rake M.D.   On: 10/21/2020 19:35     Medications:     heparin  5,000 Units Subcutaneous Q8H   influenza vac split quadrivalent PF  0.5 mL Intramuscular Tomorrow-1000   insulin aspart  0-5 Units Subcutaneous QHS   insulin aspart  0-9 Units Subcutaneous TID WC   mometasone-formoterol  2 puff Inhalation BID   sodium bicarbonate  1,300 mg Oral TID   sodium chloride flush  3 mL Intravenous Q12H   acetaminophen **OR** acetaminophen, albuterol, cyclobenzaprine, morphine injection, ondansetron **OR** ondansetron (ZOFRAN) IV, oxyCODONE  Assessment/ Plan:  [name] is a 59 y.o.  female presented to the ED for worsening SOB, and dyspnea on exertion, has h/o CHF with EF 30 -35%,CKD Stage IV worsening to V, Diabetes and hypertension. She was admitted to the hospital recently for COPD exacerbation and pneumonia.  # Acute Kidney Injury on CKD Stage IV  Baseline Creatinine 3.14 on 09/15/2020 with GFR 18  Lab Results  Component Value Date   CREATININE 3.69 (H) 10/23/2020   CREATININE 3.37 (H) 10/22/2020   CREATININE 3.37 (  H) 10/21/2020   Recent renal biopsy on 09/11/20 shows acute interstitial nephritis hypersensitivity type.  At that time, patient was asked to stop valacyclovir. However no improvement in renal function.  Holding several home agents that could be the culprit such as mirtazapine, topiramate, fenofibrate, amlodipine, aspirin, carvedilol, isosorbide mononitrate, and omeprazole. No acute indication for dialysis today  #Diabetes with CKD Not well controlled: hemoglobin A1c of 8.6%  #Hypertension Blood Pressure above goal 160/75 On Amlodipine and Coreg  Patient is on Furosemide 40mg  Q12hours Received topical Nitroglycerin in ED  # Acute on chronic systolic CHF exacerbation: EF 30-35% from TTE on  02/27/2020 - IV loop diuretics.   LOS: 2 Breleigh Carpino 11/4/202111:30 AM

## 2020-10-23 NOTE — TOC Transition Note (Addendum)
Transition of Care [facility]) - CM/SW Discharge Note   Patient Details  Name: Terry Arroyo MRN: [id number] Date of Birth: 1961/09/07  Transition of Care [facility]) CM/SW Contact:  Eileen Stanford, LCSW Phone Number: 10/23/2020, 10:38 AM   Clinical Narrative:  Pt is alert and oriented. CSW met with pt at bedside. Pt confirmed she is from [facility]. Pt states [facility] transports her to her doctor appointments. Pt states [employer] mails her her prescriptions. Pt has a established PCP--Dr. Ouida Sills. Pt is not receiving any in home services at this time. Pt is refusing to go to SNF it is recommended. Pt states she would like HH and would like a shower chair. Pt states [employer] called her and told her [facility] would provide her HH--CSW to follow up with [facility].  Heart Failure Screening: Pt states she has a scale at home and is aware to weigh herself daily. Pt is also aware to contact her doctor if she notices weight gain.          Barriers to Discharge: Continued Medical Work up   Patient Goals and CMS Choice Patient states their goals for this hospitalization and ongoing recovery are:: to get better   Choice offered to / list presented to : Patient  Discharge Placement                       Discharge Plan and Services In-house Referral: Clinical Social Work   Post Acute Care Choice: Home Health          DME Arranged: Shower stool DME Agency: [employer] Date DME Agency Contacted: 10/23/20 Time DME Agency Contacted: [phone] Representative spoke with at DME Agency: [place]: [place] Determinants of Health ([place]) Interventions     Readmission Risk Interventions Readmission Risk Prevention Plan 10/23/2020 10/12/2020  Transportation Screening Complete Complete  Medication Review Press photographer) Complete Complete  PCP or Specialist appointment within 3-5 days of discharge Complete Complete  HRI or  Home Care Consult Complete -  SW Recovery Care/Counseling Consult Complete Complete  Palliative Care Screening Not Applicable Not [place] Not Applicable Not Complete  SNF Comments - Patient pending evaluation  Some recent data might be hidden

## 2020-10-23 NOTE — Plan of Care (Signed)

## 2020-10-23 NOTE — TOC Progression Note (Signed)
Transition of Care [facility]) - Progression Note    Patient Details  Name: Terry Arroyo MRN: [id number] Date of Birth: 1961/02/04  Transition of [facility]) CM/SW Contact  Eileen Stanford, LCSW Phone Number: 10/23/2020, 11:46 AM  Clinical Narrative:   [facility] is unable to provide [facility] RN therefore can not accept pt. [facility] can accept at this time--CSW will follow up with pt to determine choice.    Expected Discharge Plan: [place] Barriers to Discharge: Continued Medical Work up  Expected Discharge Plan and Services Expected Discharge Plan: [place] In-house Referral: Clinical Social Work   Post Acute Care Choice: [place] arrangements for the past 2 months: [place]                 DME Arranged: Shower stool DME Agency: [employer] Date DME Agency Contacted: 10/23/20 Time DME Agency Contacted: [phone] Representative spoke with at DME Agency: [place]: [place] Determinants of Health ([place]) Interventions    Readmission Risk Interventions Readmission Risk Prevention Plan 10/23/2020 10/12/2020  Transportation Screening Complete Complete  Medication Review Press photographer) Complete Complete  PCP or Specialist appointment within 3-5 days of discharge Complete Complete  HRI or Home Care Consult Complete -  SW Recovery Care/Counseling Consult Complete Complete  Palliative Care Screening Not Applicable Not [place] Not Applicable Not Complete  SNF Comments - Patient pending evaluation  Some recent data might be hidden

## 2020-10-23 NOTE — Progress Notes (Signed)
Mobility Specialist - Progress Note   10/23/20 1400  Mobility  Range of Motion/Exercises Right leg;Left leg (quad sets, ankle pumps, slr, hip iso, hip add/abd)  Level of Assistance Standby assist, set-up cues, supervision of patient - no hands on  Distance Ambulated (ft) 0 ft  Mobility Response Tolerated well  Mobility performed by Mobility specialist  $Mobility charge 1 Mobility    Pre-mobility: 62 HR, 99% SpO2 Post-mobility: 65 HR, 98% SpO2   Pt was lying in bed upon arrival utilizing 2L [place] O2. Pt agreed to session. Pt was c/o back pain and stated that pain worsens with movement. Pt performed bed exercises: ankle pumps, quad sets, straight leg raises, hip abduction/adduction, hip isometrics. Overall, pt tolerated session well. Pt remains in bed with all needs in reach and alarm set.    Kathee Delton Mobility Specialist 10/23/20, 2:58 PM

## 2020-10-24 ENCOUNTER — Inpatient Hospital Stay: Payer: Medicare HMO

## 2020-10-24 LAB — GLUCOSE, CAPILLARY
Glucose-Capillary: 178 mg/dL — ABNORMAL HIGH (ref 70–99)
Glucose-Capillary: 164 mg/dL — ABNORMAL HIGH (ref 70–99)
Glucose-Capillary: 185 mg/dL — ABNORMAL HIGH (ref 70–99)
Glucose-Capillary: 307 mg/dL — ABNORMAL HIGH (ref 70–99)

## 2020-10-24 LAB — BASIC METABOLIC PANEL
Anion gap: 12 (ref 5–15)
BUN: 47 mg/dL — ABNORMAL HIGH (ref 6–20)
CO2: 22 mmol/L (ref 22–32)
Calcium: 8.2 mg/dL — ABNORMAL LOW (ref 8.9–10.3)
Chloride: 103 mmol/L (ref 98–111)
Creatinine, Ser: 3.92 mg/dL — ABNORMAL HIGH (ref 0.44–1.00)
GFR, Estimated: 13 mL/min — ABNORMAL LOW (ref >60)
Glucose, Bld: 213 mg/dL — ABNORMAL HIGH (ref 70–99)
Potassium: 3.1 mmol/L — ABNORMAL LOW (ref 3.5–5.1)
Sodium: 137 mmol/L (ref 135–145)

## 2020-10-24 LAB — CBC
HCT: 27.8 % — ABNORMAL LOW (ref 36.0–46.0)
Hemoglobin: 8.9 g/dL — ABNORMAL LOW (ref 12.0–15.0)
MCH: 29.2 pg (ref 26.0–34.0)
MCHC: 32 g/dL (ref 30.0–36.0)
MCV: 91.1 fL (ref 80.0–100.0)
Platelets: 438 10*3/uL — ABNORMAL HIGH (ref 150–400)
RBC: 3.05 MIL/uL — ABNORMAL LOW (ref 3.87–5.11)
RDW: 14.6 % (ref 11.5–15.5)
WBC: 12.8 10*3/uL — ABNORMAL HIGH (ref 4.0–10.5)
nRBC: 0 % (ref 0.0–0.2)

## 2020-10-24 IMAGING — CT CT T SPINE W/O CM
3 series · 9 of 33 positions shown, 10 images · non-contrast
Comparison: CT chest [DATE]

CLINICAL DATA: Mid back pain

EXAM:
CT THORACIC SPINE WITHOUT CONTRAST
TECHNIQUE: Multidetector CT images of the thoracic were obtained using the
standard protocol without intravenous contrast.

[Series 4: t spine soft · axial · 0.25mm/px · z∈[-214,-214]mm · 1 of 152 slices shown, 2 images]
[im 82/152  soft-tissue]
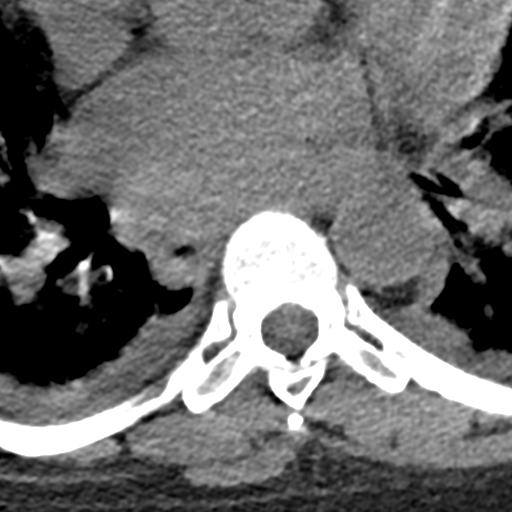
[im 82/152  bone]
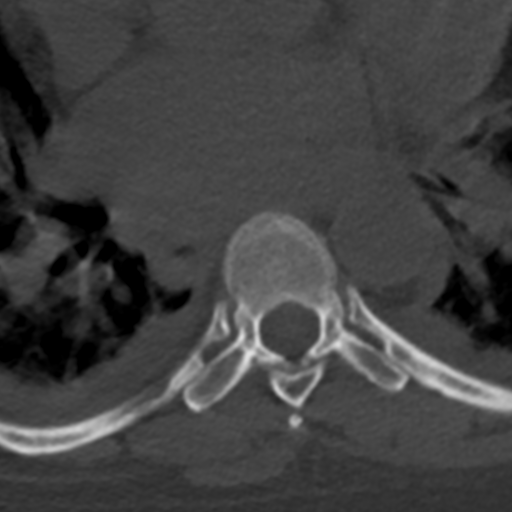

[Series 6: sagittal bone · sagittal · 0.27mm/px · 5 of 71 slices shown]
[im 24/71  bone]
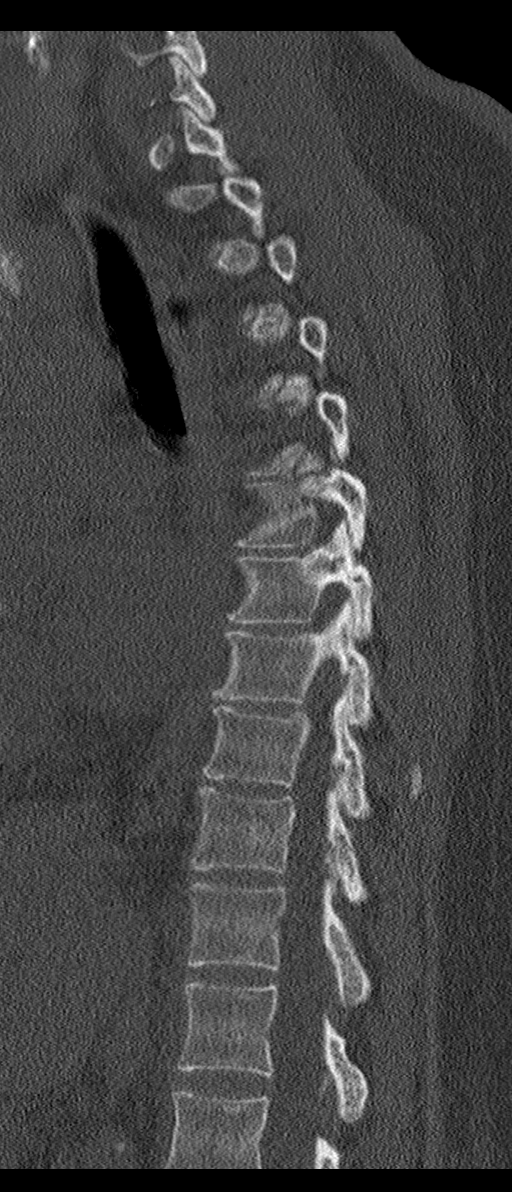
[im 30/71  bone]
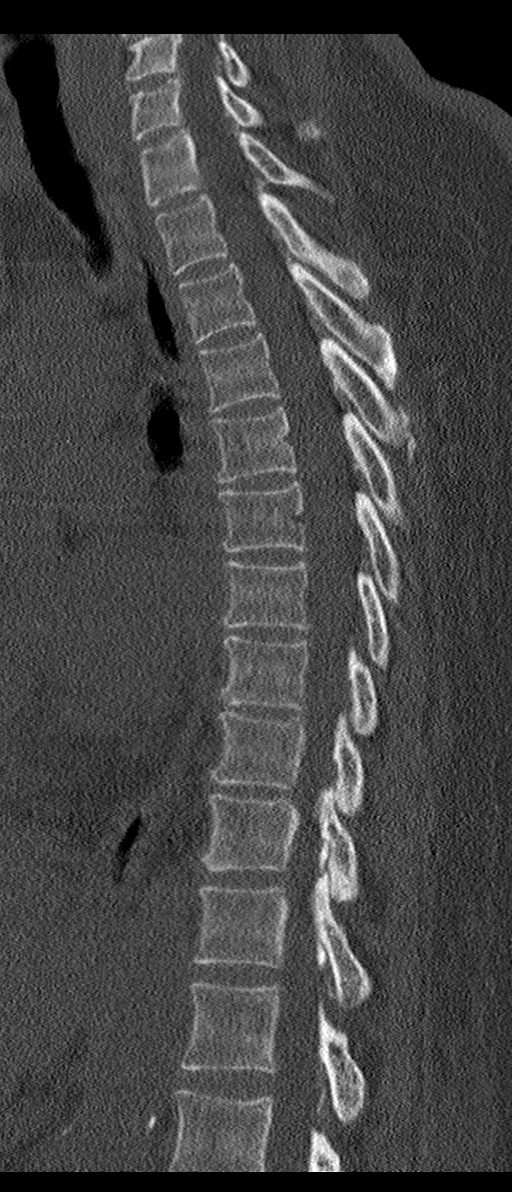
[im 36/71  bone]
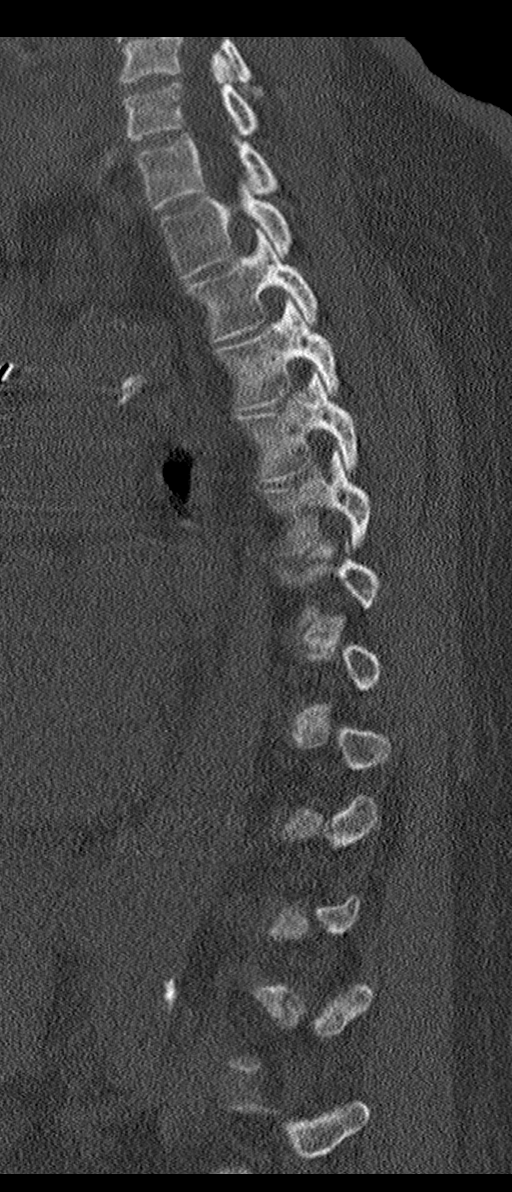
[im 41/71  bone]
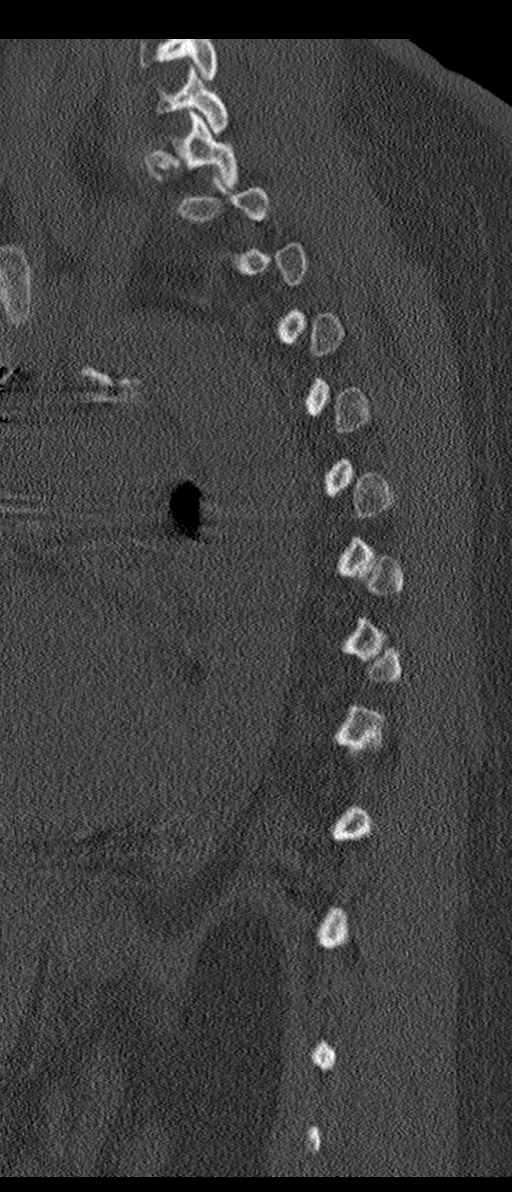
[im 47/71  bone]
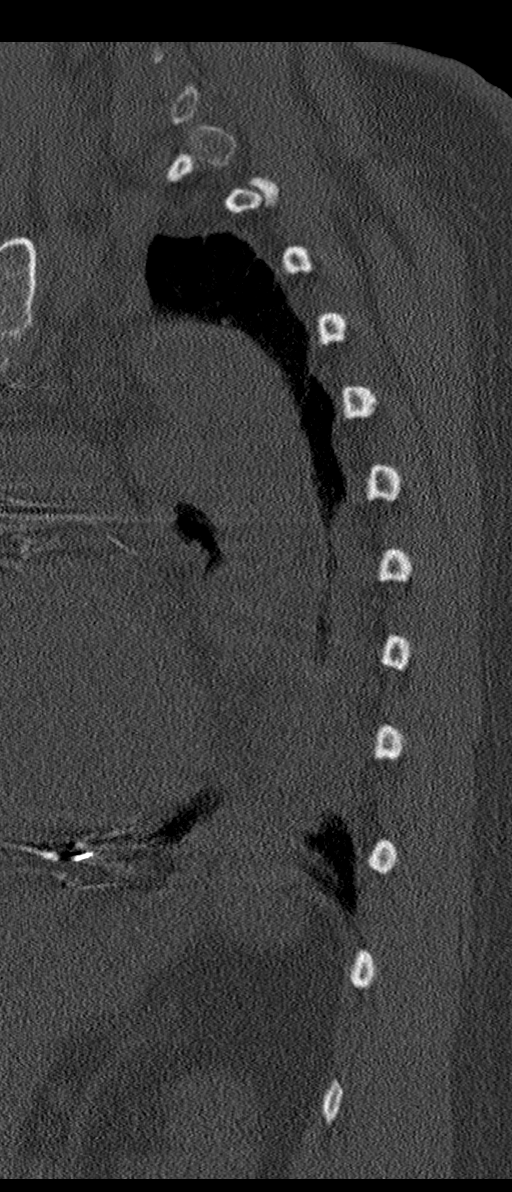

[Series 7: coronal bone · coronal · 0.28mm/px · 3 of 71 slices shown]
[im 15/71  bone]
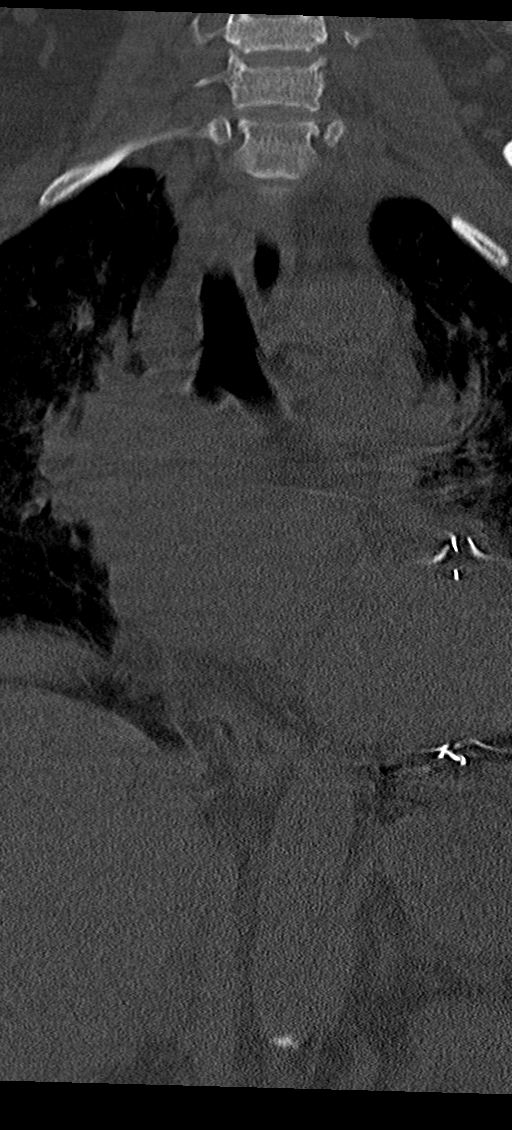
[im 29/71  bone]
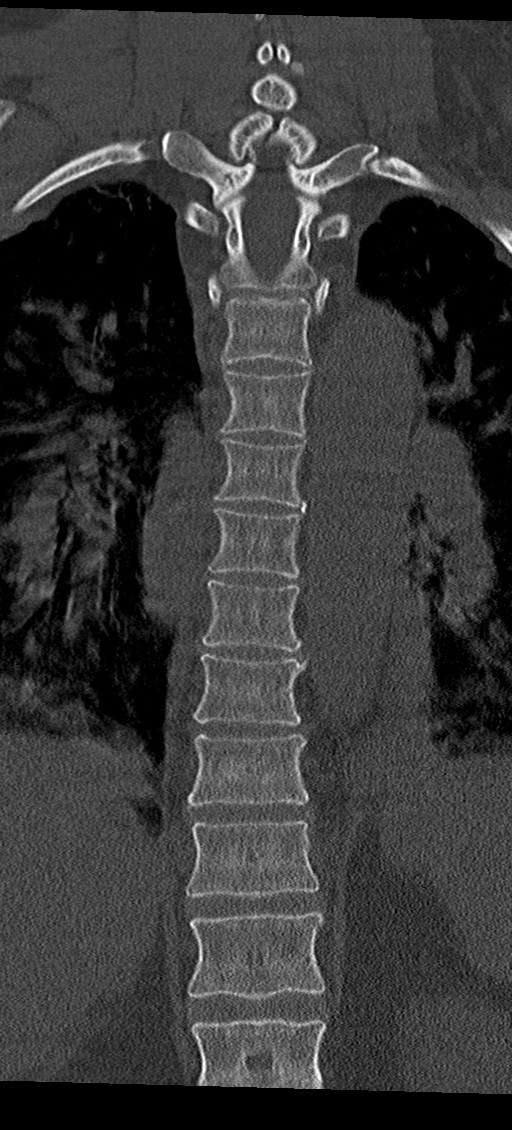
[im 43/71  bone]
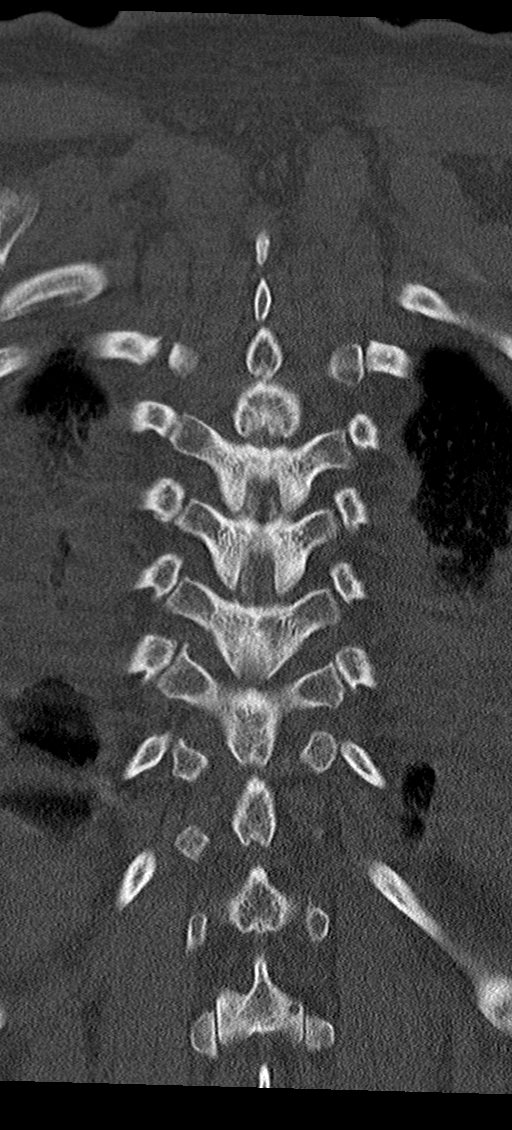

[9 of 33 positions shown; findings below may reference images not displayed]

FINDINGS: Alignment: Normal

Vertebrae: Negative for fracture or mass. No evidence of spinal
infection.

Paraspinal and other soft tissues: Negative for paraspinous No fluid
mass collection or edema.

Small bilateral pleural effusions. Diffuse mild bilateral airspace
disease.

Disc levels: No significant disc space narrowing. No disc
degeneration or spurring identified. Negative for spinal stenosis.
IMPRESSION: Negative CT thoracic spine

Bilateral airspace disease and bilateral small pleural effusions
which may represent congestive heart failure based on prior chest
x-ray.

## 2020-10-24 IMAGING — CT CT L SPINE W/O CM
3 series · 14 of 35 positions shown, 17 images · non-contrast
Comparison: CT abdomen pelvis [DATE]

CLINICAL DATA: Low back pain

EXAM:
CT LUMBAR SPINE WITHOUT CONTRAST
TECHNIQUE: Multidetector CT imaging of the lumbar spine was performed without
intravenous contrast administration. Multiplanar CT image
reconstructions were also generated.

[Series 5: l spine soft · axial · 0.38mm/px · z∈[-531,-345]mm · 6 of 122 slices shown, 8 images]
[im 19/122  soft-tissue]
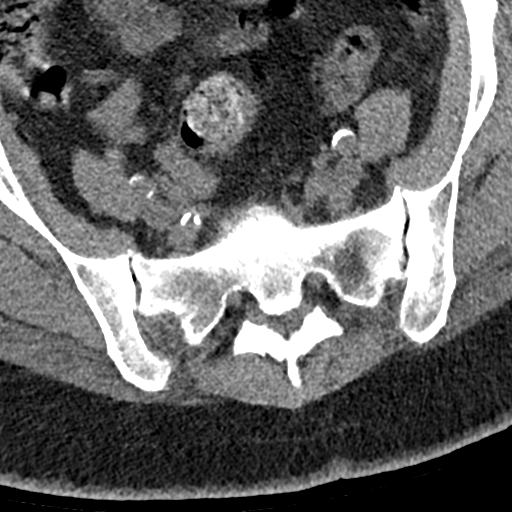
[im 19/122  bone]
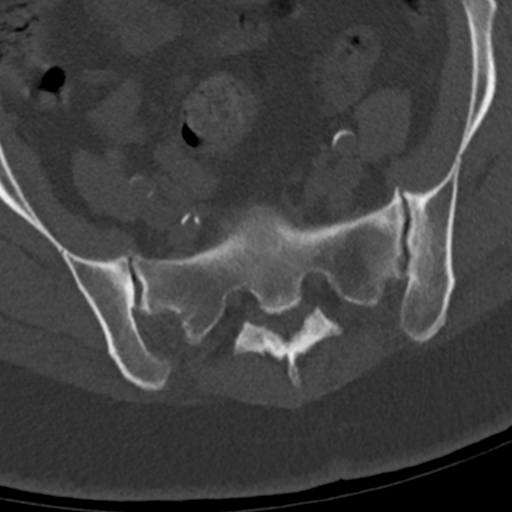
[im 38/122  bone]
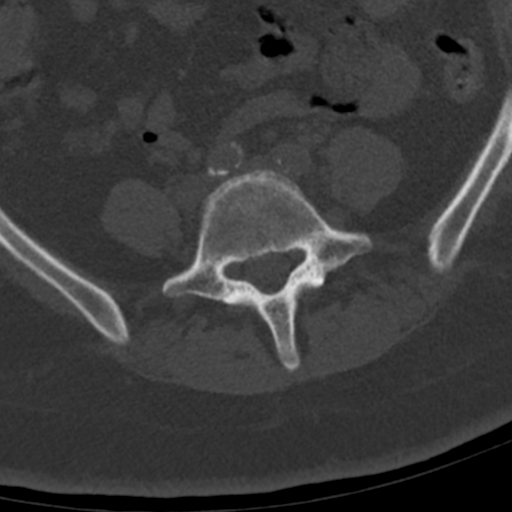
[im 56/122  bone]
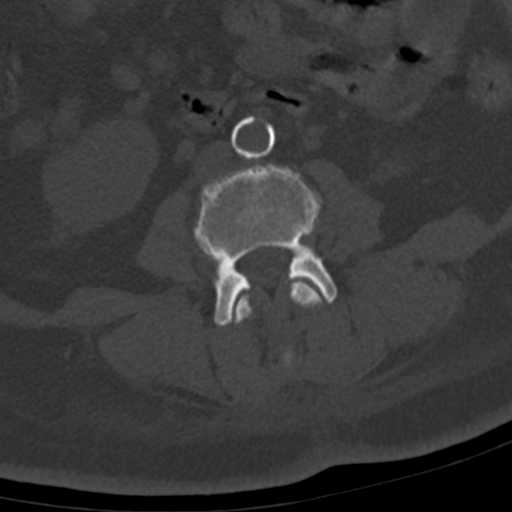
[im 75/122  bone]
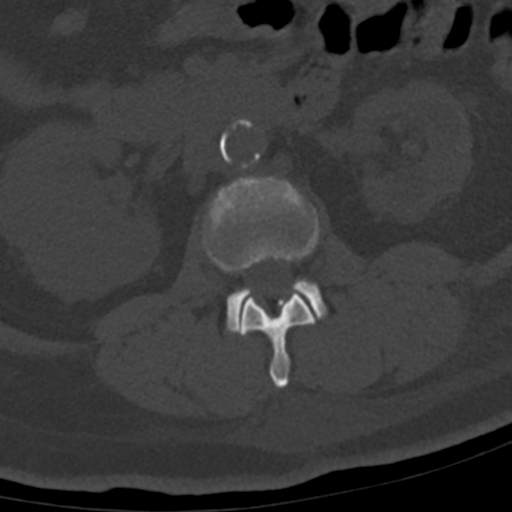
[im 94/122  soft-tissue]
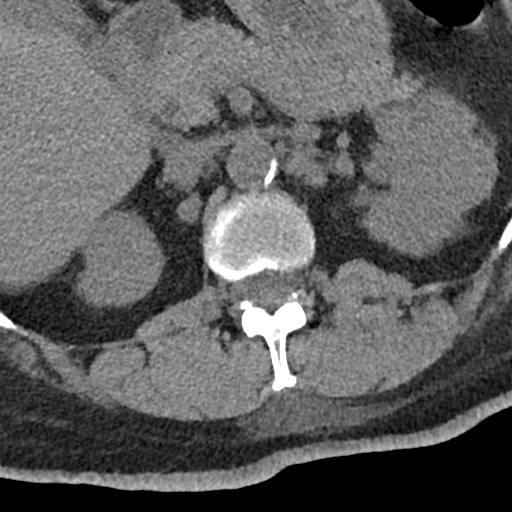
[im 94/122  bone]
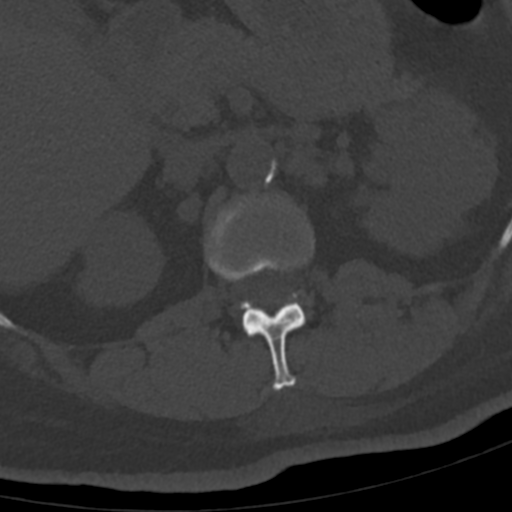
[im 112/122  bone]
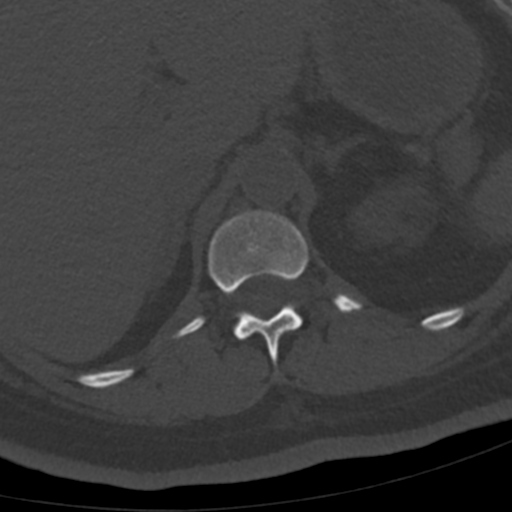

[Series 8: sagittal bone · sagittal · 0.37mm/px · 5 of 98 slices shown, 6 images]
[im 33/98  bone]
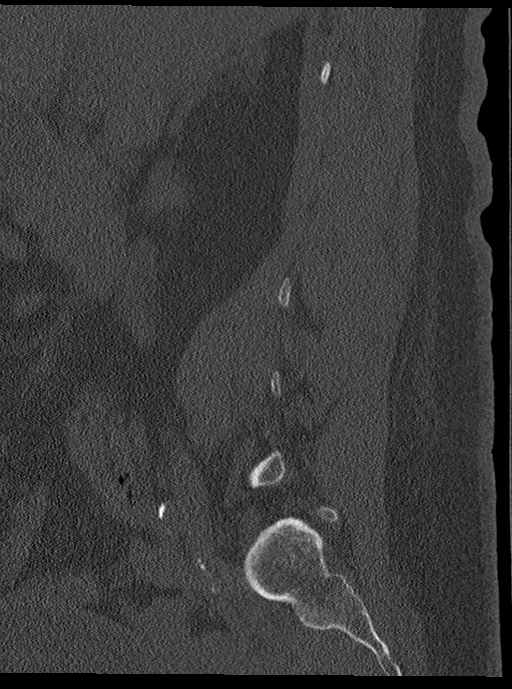
[im 41/98  bone]
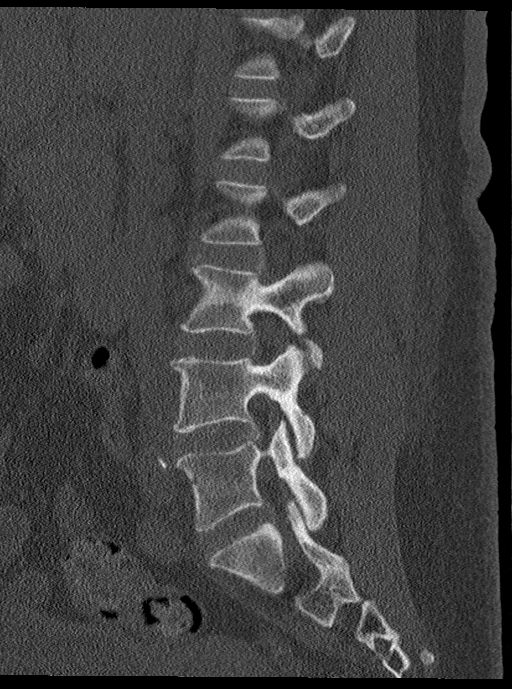
[im 49/98  soft-tissue]
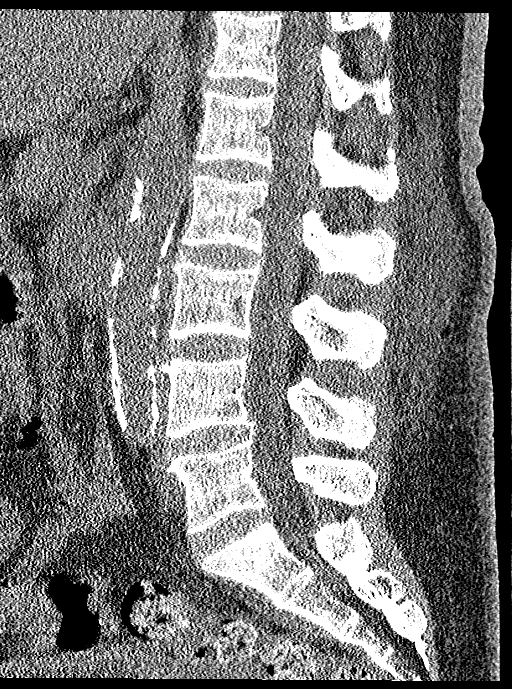
[im 49/98  bone]
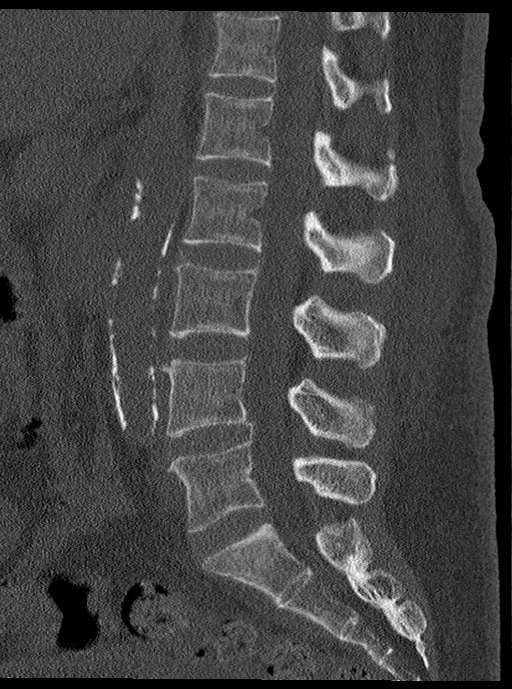
[im 57/98  bone]
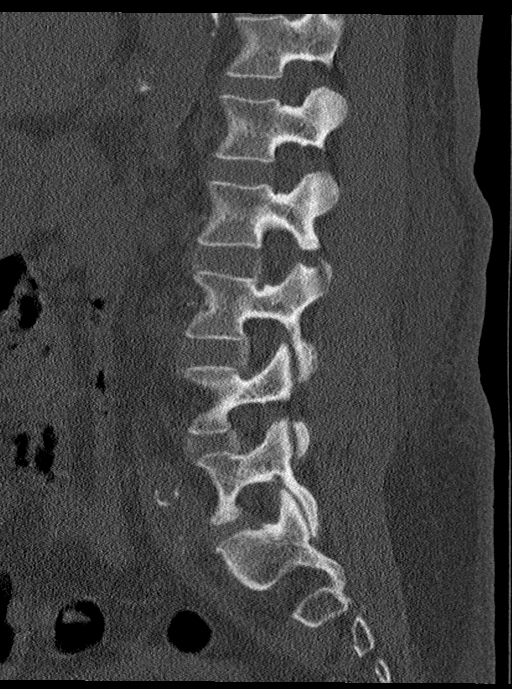
[im 65/98  bone]
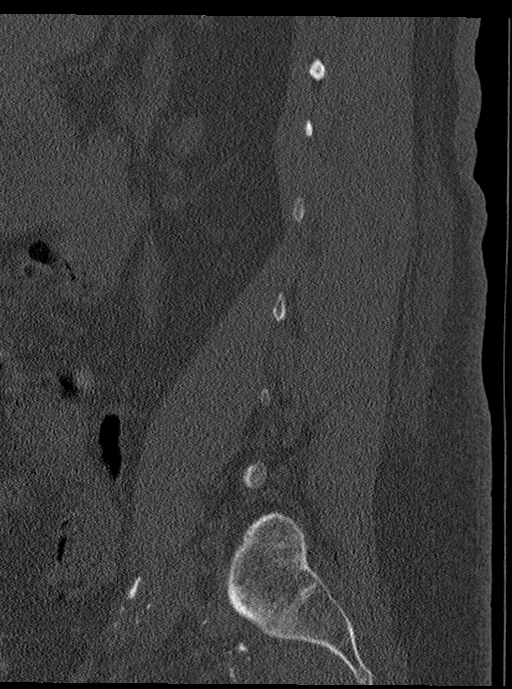

[Series 9: coronal bone · coronal · 0.38mm/px · 3 of 74 slices shown]
[im 15/74  bone]
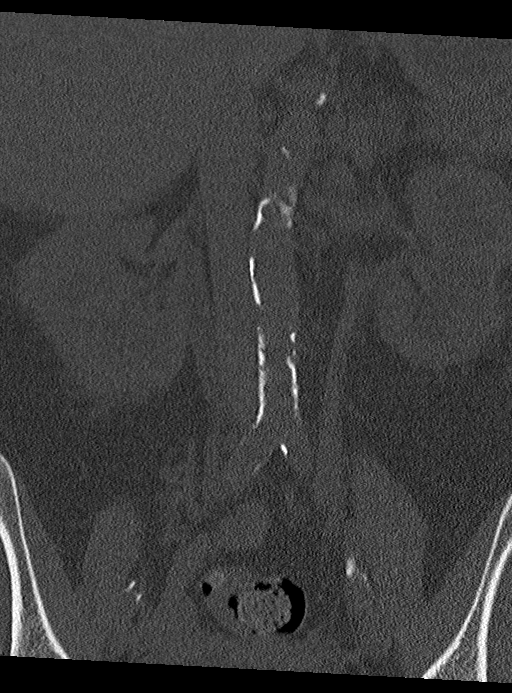
[im 30/74  bone]
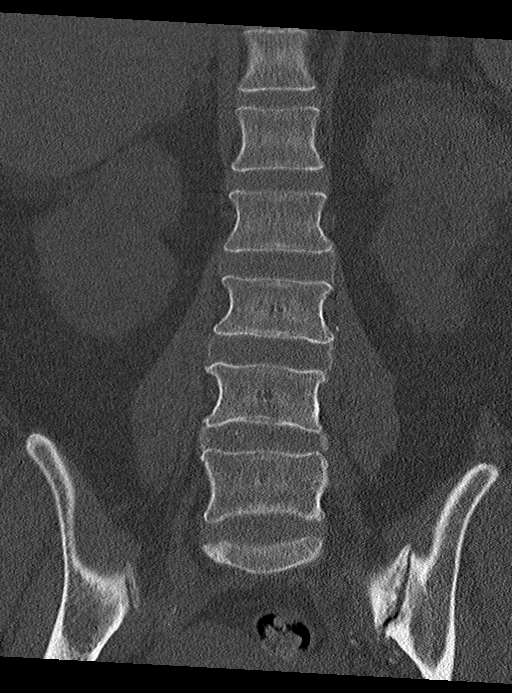
[im 44/74  bone]
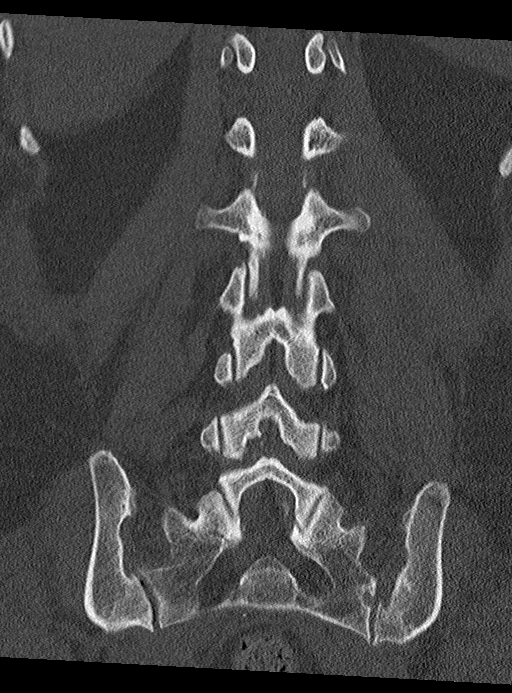

[14 of 35 positions shown; findings below may reference images not displayed]

FINDINGS: Segmentation: Normal

Alignment: Normal

Vertebrae: Negative for fracture or mass. No evidence of spinal
infection.

Paraspinal and other soft tissues: Atherosclerotic abdominal aorta
without aneurysm.

Lobular contours to the left renal cortex unchanged. Mild left
hydronephrosis unchanged. No obstructing calculus. No paraspinous
soft tissue edema or fluid collection.

Disc levels: L1-2: Negative

L2-3: Mild disc and facet degeneration. Negative for disc protrusion
or stenosis.

L3-4: Mild disc and mild facet degeneration. Negative for disc
protrusion or stenosis

L4-5: Mild disc bulging and mild facet degeneration. Negative for
disc protrusion or stenosis.

L5-S1: Mild disc degeneration. Negative for disc protrusion or
stenosis.
IMPRESSION: Mild lumbar degenerative change.  No cause for acute lumbar pain.

## 2020-10-24 MED ORDER — PNEUMOCOCCAL VAC POLYVALENT 25 MCG/0.5ML IJ INJ
0.5000 mL | INJECTION | INTRAMUSCULAR | Status: AC
  Administered 2020-10-25: 0.5 mL via INTRAMUSCULAR
  Filled 2020-10-24: qty 0.5

## 2020-10-24 MED ORDER — OXYCODONE HCL 5 MG PO TABS
10.0000 mg | ORAL_TABLET | Freq: Four times a day (QID) | ORAL | Status: DC | PRN
  Administered 2020-10-25 – 2020-10-26 (×3): 10 mg via ORAL
  Filled 2020-10-24 (×4): qty 2

## 2020-10-24 MED ORDER — PREDNISONE 50 MG PO TABS
80.0000 mg | ORAL_TABLET | Freq: Every day | ORAL | Status: DC
  Administered 2020-10-24 – 2020-10-25 (×2): 80 mg via ORAL
  Filled 2020-10-24 (×2): qty 1

## 2020-10-24 MED ORDER — SODIUM BICARBONATE 650 MG PO TABS
650.0000 mg | ORAL_TABLET | Freq: Two times a day (BID) | ORAL | Status: DC
  Administered 2020-10-24 – 2020-10-28 (×8): 650 mg via ORAL
  Filled 2020-10-24 (×8): qty 1

## 2020-10-24 MED ORDER — IPRATROPIUM-ALBUTEROL 0.5-2.5 (3) MG/3ML IN SOLN
3.0000 mL | RESPIRATORY_TRACT | Status: DC | PRN
  Filled 2020-10-24: qty 3

## 2020-10-24 NOTE — Care Management Important Message (Signed)
Important Message  Patient Details  Name: Terry Arroyo MRN: [id number] Date of Birth: [birth year]   Medicare Important Message Given:  Yes     [name] 10/24/2020, 10:44 AM

## 2020-10-24 NOTE — Progress Notes (Signed)
Mobility Specialist - Progress Note   10/24/20 1603  Mobility  Activity Contraindicated/medical hold  Mobility performed by Mobility specialist    Pt was seen this date for mobility session. However, upon arrival, pt c/o pain that "started in her back and transitioned to the R side of her body", whereas pt stated "I can barely move my arm." Pt also c/o chest tightness, stating "it feels tight and warm in my chest". Session was concluded and nurse was immediately notified of these concerns. Will attempt session as pt becomes medically appropriate.    Kathee Delton Mobility Specialist 10/24/20, 4:06 PM

## 2020-10-24 NOTE — Progress Notes (Signed)
PROGRESS NOTE    Terry Arroyo  RCV:[id number] DOB: 1961-07-11 DOA: 10/21/2020 PCP: Kirk Ruths, MD    Assessment & Plan:   Principal Problem:   Acute on chronic systolic congestive heart failure (Terry Arroyo) Active Problems:   DM2 (diabetes mellitus, type 2) (HCC)   CKD (chronic kidney disease) stage 4, GFR 15-29 ml/min (HCC)   Hypertension associated with diabetes (Terry Arroyo)   CAD (coronary artery disease)   Hyperlipidemia associated with type 2 diabetes mellitus (Terry Arroyo)   OSA (obstructive sleep apnea)  AKI on CKD IV: w/ hx of nephrotic syndrome. Cr continues to trend up daily.  Hold lasix, coreg, imdur, aspirin and any other nephrotoxic meds as pt's kidney biopsy showed hypersensitivity reaction. Nephro following and recs apprec  Acute on chronic systolic CHF exacerbation: echo showed EF 30-35% 02/27/2020.Neg approx 1.1L. Strict I/Os & daily weights. Initially required BiPAP and now currently on 2L . Hold lasix secondary to AKI on CKD. Not on ACE/ARB/spironolactone due to renal dysfunction  Thrombocytosis: etiology unclear. Likely reactive. Will continue to monitor   CAD: s/p CABG. Continue to hold home dose of aspirin, imdur, statin, coreg as pt's kidney biopsy showed hypersensitivity   COPD: w/o exacerbation. Continue on bronchodilators & encourage incentive spirometry   Likely ACD: likely secondary to CKD. Will transfuse w/ Hb <7.0.  Hypokalemia: will hold off replacing secondary AKI on CKDIV. Nephro is following   DM2: poorly controlled w/ HbA1c 8.6. Continue on SSI w/ accuchecks   HTN: continue to hold anti-HTN secondary to pt's kidney biopsy showing hypersensitivity reaction    HLD: continue to hold statin   OSA: does not use CPAP at home     DVT prophylaxis: heparin  Code Status: full  Family Communication:  Disposition Plan: likely d/c back home.  Status is: Inpatient  Remains inpatient appropriate because:Ongoing diagnostic testing needed not appropriate  for outpatient work up and IV treatments appropriate due to intensity of illness or inability to take PO, Cr continues to trend up daily    Dispo: The patient is from: Home              Anticipated d/c is to: Home              Anticipated d/c date is: 3 days              Patient currently is not medically stable to d/c.     Consultants:   nephro    Procedures:   Antimicrobials:   Subjective: Pt c/o malaise.  Objective: Vitals:   10/23/20 1922 10/24/20 0441 10/24/20 0749 10/24/20 1133  BP: (!) 179/81 (!) 153/73 (!) 161/79 (!) 164/82  Pulse: 68 68 71 72  Resp: 20 19 19 19   Temp: 99.2 F (37.3 C) 98.9 F (37.2 C) 98.1 F (36.7 C) 99.1 F (37.3 C)  TempSrc: Oral Oral  Oral  SpO2: 100% 99% 98% 100%  Weight:  84.2 kg      Intake/Output Summary (Last 24 hours) at 10/24/2020 1203 Last data filed at 10/24/2020 1027 Gross per 24 hour  Intake 480 ml  Output 1625 ml  Net -1145 ml   Filed Weights   10/21/20 1927 10/23/20 0154 10/24/20 0441  Weight: 82.6 kg 87.1 kg 84.2 kg    Examination:  General exam: Appears lethargic  Respiratory system: diminished breath sounds b/l.  Cardiovascular system: S1 & S2 +. No rubs or gallops Gastrointestinal system: Abdomen is nondistended, soft and nontender. Hypoactive bowel sounds  Central nervous system:  Appears lethargic. Moves all 4 extremities  Psychiatry: judgement and insight appear normal. Flat mood and affect    Data Reviewed: I have personally reviewed following labs and imaging studies  CBC: Recent Labs  Lab 10/21/20 1931 10/22/20 0500 10/23/20 0350 10/24/20 0342  WBC 12.2* 9.8 10.0 12.8*  NEUTROABS 9.3*  --   --   --   HGB 10.0* 9.3* 8.5* 8.9*  HCT 31.1* 28.7* 25.7* 27.8*  MCV 92.0 90.3 89.2 91.1  PLT 376 381 374 272*   Basic Metabolic Panel: Recent Labs  Lab 10/21/20 1931 10/22/20 0500 10/23/20 0350 10/24/20 0342  NA 142 143 141 137  K 3.7 3.3* 3.2* 3.1*  CL 110 111 110 103  CO2 18* 21* 23 22    GLUCOSE 222* 261* 196* 213*  BUN 44* 43* 46* 47*  CREATININE 3.37* 3.37* 3.69* 3.92*  CALCIUM 8.2* 8.3* 7.9* 8.2*  MG  --  1.8  --   --    GFR: Estimated Creatinine Clearance: 17.1 mL/min (A) (by C-G formula based on SCr of 3.92 mg/dL (H)). Liver Function Tests: Recent Labs  Lab 10/21/20 1931  AST 17  ALT 8  ALKPHOS 70  BILITOT 1.1  PROT 6.7  ALBUMIN 2.4*   No results for input(s): LIPASE, AMYLASE in the last 168 hours. No results for input(s): AMMONIA in the last 168 hours. Coagulation Profile: No results for input(s): INR, PROTIME in the last 168 hours. Cardiac Enzymes: No results for input(s): CKTOTAL, CKMB, CKMBINDEX, TROPONINI in the last 168 hours. BNP (last 3 results) No results for input(s): PROBNP in the last 8760 hours. HbA1C: No results for input(s): HGBA1C in the last 72 hours. CBG: Recent Labs  Lab 10/23/20 1139 10/23/20 1621 10/23/20 2058 10/24/20 0806 10/24/20 1134  GLUCAP 158* 147* 153* 178* 164*   Lipid Profile: No results for input(s): CHOL, HDL, LDLCALC, TRIG, CHOLHDL, LDLDIRECT in the last 72 hours. Thyroid Function Tests: No results for input(s): TSH, T4TOTAL, FREET4, T3FREE, THYROIDAB in the last 72 hours. Anemia Panel: No results for input(s): VITAMINB12, FOLATE, FERRITIN, TIBC, IRON, RETICCTPCT in the last 72 hours. Sepsis Labs: Recent Labs  Lab 10/21/20 1931  LATICACIDVEN 1.1    Recent Results (from the past 240 hour(s))  Respiratory Panel by RT PCR (Flu A&B, Covid) - Nasopharyngeal Swab     Status: None   Collection Time: 10/21/20 11:51 PM   Specimen: Nasopharyngeal Swab  Result Value Ref Range Status   SARS Coronavirus 2 by RT PCR NEGATIVE NEGATIVE Final    Comment: (NOTE) SARS-CoV-2 target nucleic acids are NOT DETECTED.  The SARS-CoV-2 RNA is generally detectable in upper respiratoy specimens during the acute phase of infection. The lowest concentration of SARS-CoV-2 viral copies this assay can detect is 131 copies/mL. A  negative result does not preclude SARS-Cov-2 infection and should not be used as the sole basis for treatment or other patient management decisions. A negative result may occur with  improper specimen collection/handling, submission of specimen other than nasopharyngeal swab, presence of viral mutation(s) within the areas targeted by this assay, and inadequate number of viral copies (<131 copies/mL). A negative result must be combined with clinical observations, patient history, and epidemiological information. The expected result is Negative.  Fact Sheet for Patients:  PinkCheek.be  Fact Sheet for Healthcare Providers:  GravelBags.it  This test is no t yet approved or cleared by the [place] FDA and  has been authorized for detection and/or diagnosis of SARS-CoV-2 by FDA under an Emergency  Use Authorization (EUA). This EUA will remain  in effect (meaning this test can be used) for the duration of the COVID-19 declaration under Section 564(b)(1) of the Act, 21 U.S.C. section 360bbb-3(b)(1), unless the authorization is terminated or revoked sooner.     Influenza A by PCR NEGATIVE NEGATIVE Final   Influenza B by PCR NEGATIVE NEGATIVE Final    Comment: (NOTE) The Xpert Xpress SARS-CoV-2/FLU/RSV assay is intended as an aid in  the diagnosis of influenza from Nasopharyngeal swab specimens and  should not be used as a sole basis for treatment. Nasal washings and  aspirates are unacceptable for Xpert Xpress SARS-CoV-2/FLU/RSV  testing.  Fact Sheet for Patients: PinkCheek.be  Fact Sheet for Healthcare Providers: GravelBags.it  This test is not yet approved or cleared by the [place] FDA and  has been authorized for detection and/or diagnosis of SARS-CoV-2 by  FDA under an Emergency Use Authorization (EUA). This EUA will remain  in effect (meaning this test can  be used) for the duration of the  Covid-19 declaration under Section 564(b)(1) of the Act, 21  U.S.C. section 360bbb-3(b)(1), unless the authorization is  terminated or revoked. Performed at [facility], [address] [place], [place] [address]          Radiology Studies: No results found.      Scheduled Meds: . heparin  5,000 Units Subcutaneous Q8H  . influenza vac split quadrivalent PF  0.5 mL Intramuscular Tomorrow-1000  . insulin aspart  0-5 Units Subcutaneous QHS  . insulin aspart  0-9 Units Subcutaneous TID WC  . mometasone-formoterol  2 puff Inhalation BID  . sodium bicarbonate  650 mg Oral BID  . sodium chloride flush  3 mL Intravenous Q12H   Continuous Infusions:   LOS: 3 days    Time spent: 32 mins    Wyvonnia Dusky, MD [facility] Pager [phone]  If 7PM-7AM, please contact night-coverage [email or web address] 10/24/2020, 12:03 PM

## 2020-10-24 NOTE — Progress Notes (Signed)
[facility]  ROUNDING NOTE   Subjective:   Patient resting in bed, in no acute distress.  Urine output for the preceding 24 hours is 2,575 ml  Objective:  Vital signs in last 24 hours:  Temp:  [98.1 F (36.7 C)-99.2 F (37.3 C)] 99.2 F (37.3 C) (11/05 1602) Pulse Rate:  [66-75] 75 (11/05 1602) Resp:  [18-20] 20 (11/05 1602) BP: (153-179)/(73-93) 164/93 (11/05 1602) SpO2:  [98 %-100 %] 100 % (11/05 1602) Weight:  [84.2 kg] 84.2 kg (11/05 0441)  Weight change: -2.9 kg Filed Weights   10/21/20 1927 10/23/20 0154 10/24/20 0441  Weight: 82.6 kg 87.1 kg 84.2 kg    Intake/Output: I/O last 3 completed shifts: In: 600 [P.O.:600] Out: 6629 [Urine:3475; Emesis/NG output:300]   Intake/Output this shift:  Total I/O In: 240 [P.O.:240] Out: 1000 [Urine:1000]  Physical Exam: General: Resting in bed,In no acute distress  Head: Moist oral mucosal membranes  Eyes: Sclerae and conjunctivae clear  Lungs:  Respirations even,unlabored,Lungs clear  Heart: S1S2,no rubs or gallops  Abdomen:  Soft, nontender, non distended  Extremities:  +peripheral edema.  Neurologic: Alert,awake,oriented  Skin: No acute lesions or rashes    Basic Metabolic Panel: Recent Labs  Lab 10/21/20 1931 10/21/20 1931 10/22/20 0500 10/23/20 0350 10/24/20 0342  NA 142  --  143 141 137  K 3.7  --  3.3* 3.2* 3.1*  CL 110  --  111 110 103  CO2 18*  --  21* 23 22  GLUCOSE 222*  --  261* 196* 213*  BUN 44*  --  43* 46* 47*  CREATININE 3.37*  --  3.37* 3.69* 3.92*  CALCIUM 8.2*   < > 8.3* 7.9* 8.2*  MG  --   --  1.8  --   --    < > = values in this interval not displayed.    Liver Function Tests: Recent Labs  Lab 10/21/20 1931  AST 17  ALT 8  ALKPHOS 70  BILITOT 1.1  PROT 6.7  ALBUMIN 2.4*   No results for input(s): LIPASE, AMYLASE in the last 168 hours. No results for input(s): AMMONIA in the last 168 hours.  CBC: Recent Labs  Lab 10/21/20 1931 10/22/20 0500 10/23/20 0350  10/24/20 0342  WBC 12.2* 9.8 10.0 12.8*  NEUTROABS 9.3*  --   --   --   HGB 10.0* 9.3* 8.5* 8.9*  HCT 31.1* 28.7* 25.7* 27.8*  MCV 92.0 90.3 89.2 91.1  PLT 376 381 374 438*    Cardiac Enzymes: No results for input(s): CKTOTAL, CKMB, CKMBINDEX, TROPONINI in the last 168 hours.  BNP: Invalid input(s): POCBNP  CBG: Recent Labs  Lab 10/23/20 1621 10/23/20 2058 10/24/20 0806 10/24/20 1134 10/24/20 1631  GLUCAP 147* 153* 178* 164* 185*    Microbiology: Results for orders placed or performed during the hospital encounter of 10/21/20  Respiratory Panel by RT PCR (Flu A&B, Covid) - Nasopharyngeal Swab     Status: None   Collection Time: 10/21/20 11:51 PM   Specimen: Nasopharyngeal Swab  Result Value Ref Range Status   SARS Coronavirus 2 by RT PCR NEGATIVE NEGATIVE Final    Comment: (NOTE) SARS-CoV-2 target nucleic acids are NOT DETECTED.  The SARS-CoV-2 RNA is generally detectable in upper respiratoy specimens during the acute phase of infection. The lowest concentration of SARS-CoV-2 viral copies this assay can detect is 131 copies/mL. A negative result does not preclude SARS-Cov-2 infection and should not be used as the sole basis for treatment  or other patient management decisions. A negative result may occur with  improper specimen collection/handling, submission of specimen other than nasopharyngeal swab, presence of viral mutation(s) within the areas targeted by this assay, and inadequate number of viral copies (<131 copies/mL). A negative result must be combined with clinical observations, patient history, and epidemiological information. The expected result is Negative.  Fact Sheet for Patients:  PinkCheek.be  Fact Sheet for Healthcare Providers:  GravelBags.it  This test is no t yet approved or cleared by the [place] FDA and  has been authorized for detection and/or diagnosis of SARS-CoV-2 by FDA  under an Emergency Use Authorization (EUA). This EUA will remain  in effect (meaning this test can be used) for the duration of the COVID-19 declaration under Section 564(b)(1) of the Act, 21 U.S.C. section 360bbb-3(b)(1), unless the authorization is terminated or revoked sooner.     Influenza A by PCR NEGATIVE NEGATIVE Final   Influenza B by PCR NEGATIVE NEGATIVE Final    Comment: (NOTE) The Xpert Xpress SARS-CoV-2/FLU/RSV assay is intended as an aid in  the diagnosis of influenza from Nasopharyngeal swab specimens and  should not be used as a sole basis for treatment. Nasal washings and  aspirates are unacceptable for Xpert Xpress SARS-CoV-2/FLU/RSV  testing.  Fact Sheet for Patients: PinkCheek.be  Fact Sheet for Healthcare Providers: GravelBags.it  This test is not yet approved or cleared by the [place] FDA and  has been authorized for detection and/or diagnosis of SARS-CoV-2 by  FDA under an Emergency Use Authorization (EUA). This EUA will remain  in effect (meaning this test can be used) for the duration of the  Covid-19 declaration under Section 564(b)(1) of the Act, 21  U.S.C. section 360bbb-3(b)(1), unless the authorization is  terminated or revoked. Performed at [facility], [address] [place], [place] [address]     Coagulation Studies: No results for input(s): LABPROT, INR in the last 72 hours.  Urinalysis: No results for input(s): COLORURINE, LABSPEC, PHURINE, GLUCOSEU, HGBUR, BILIRUBINUR, KETONESUR, PROTEINUR, UROBILINOGEN, NITRITE, LEUKOCYTESUR in the last 72 hours.  Invalid input(s): APPERANCEUR    Imaging: CT THORACIC SPINE WO CONTRAST  Result Date: 10/24/2020 CLINICAL DATA:  Mid back pain EXAM: CT THORACIC SPINE WITHOUT CONTRAST TECHNIQUE: Multidetector CT images of the thoracic were obtained using the standard protocol without intravenous contrast. COMPARISON:  CT chest  01/22/2020 FINDINGS: Alignment: Normal Vertebrae: Negative for fracture or mass. No evidence of spinal infection. Paraspinal and other soft tissues: Negative for paraspinous No fluid mass collection or edema. Small bilateral pleural effusions. Diffuse mild bilateral airspace disease. Disc levels: No significant disc space narrowing. No disc degeneration or spurring identified. Negative for spinal stenosis. IMPRESSION: Negative CT thoracic spine Bilateral airspace disease and bilateral small pleural effusions which may represent congestive heart failure based on prior chest x-ray. Electronically Signed   By: Franchot Gallo M.D.   On: 10/24/2020 15:14   CT LUMBAR SPINE WO CONTRAST  Result Date: 10/24/2020 CLINICAL DATA:  Low back pain EXAM: CT LUMBAR SPINE WITHOUT CONTRAST TECHNIQUE: Multidetector CT imaging of the lumbar spine was performed without intravenous contrast administration. Multiplanar CT image reconstructions were also generated. COMPARISON:  CT abdomen pelvis 09/12/2020 FINDINGS: Segmentation: Normal Alignment: Normal Vertebrae: Negative for fracture or mass. No evidence of spinal infection. Paraspinal and other soft tissues: Atherosclerotic abdominal aorta without aneurysm. Lobular contours to the left renal cortex unchanged. Mild left hydronephrosis unchanged. No obstructing calculus. No paraspinous soft tissue edema or fluid collection. Disc levels:  L1-2: Negative L2-3: Mild disc and facet degeneration. Negative for disc protrusion or stenosis. L3-4: Mild disc and mild facet degeneration. Negative for disc protrusion or stenosis L4-5: Mild disc bulging and mild facet degeneration. Negative for disc protrusion or stenosis. L5-S1: Mild disc degeneration. Negative for disc protrusion or stenosis. IMPRESSION: Mild lumbar degenerative change.  No cause for acute lumbar pain. Electronically Signed   By: Franchot Gallo M.D.   On: 10/24/2020 15:23     Medications:    . heparin  5,000 Units  Subcutaneous Q8H  . influenza vac split quadrivalent PF  0.5 mL Intramuscular Tomorrow-1000  . insulin aspart  0-5 Units Subcutaneous QHS  . insulin aspart  0-9 Units Subcutaneous TID WC  . mometasone-formoterol  2 puff Inhalation BID  . [START ON 10/25/2020] pneumococcal 23 valent vaccine  0.5 mL Intramuscular Tomorrow-1000  . predniSONE  80 mg Oral Q breakfast  . sodium bicarbonate  650 mg Oral BID  . sodium chloride flush  3 mL Intravenous Q12H   acetaminophen **OR** acetaminophen, cyclobenzaprine, ipratropium-albuterol, morphine injection, ondansetron **OR** ondansetron (ZOFRAN) IV, oxyCODONE  Assessment/ Plan:  Terry Arroyo is a 59 y.o.  female presented to the ED for worsening SOB, and dyspnea on exertion, has h/o CHF with EF 30 -35%,CKD Stage IV worsening to V, Diabetes and hypertension. She was admitted to the hospital recently for COPD exacerbation and pneumonia.  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  Urine output for the preceding 24 hours is 2,575 ml Nephrotoxic agents on hold  #Diabetes with CKD Hemoglobin A1c of 8.6% Blood glucose readings above goal On Insulin Aspart scheduled and sliding scale  # Acute on chronic systolic CHF exacerbation: EF 30-35% from TTE on 02/27/2020 In no acute respiratory distress On 2L supplemental O2 via nasal cannula  LOS: 3 Terry Arroyo 11/5/20215:17 PM

## 2020-10-25 LAB — BASIC METABOLIC PANEL
Anion gap: 12 (ref 5–15)
BUN: 51 mg/dL — ABNORMAL HIGH (ref 6–20)
CO2: 23 mmol/L (ref 22–32)
Calcium: 8.3 mg/dL — ABNORMAL LOW (ref 8.9–10.3)
Chloride: 103 mmol/L (ref 98–111)
Creatinine, Ser: 4.01 mg/dL — ABNORMAL HIGH (ref 0.44–1.00)
GFR, Estimated: 12 mL/min — ABNORMAL LOW (ref >60)
Glucose, Bld: 363 mg/dL — ABNORMAL HIGH (ref 70–99)
Potassium: 3.6 mmol/L (ref 3.5–5.1)
Sodium: 138 mmol/L (ref 135–145)

## 2020-10-25 LAB — GLUCOSE, CAPILLARY
Glucose-Capillary: 319 mg/dL — ABNORMAL HIGH (ref 70–99)
Glucose-Capillary: 313 mg/dL — ABNORMAL HIGH (ref 70–99)
Glucose-Capillary: 234 mg/dL — ABNORMAL HIGH (ref 70–99)
Glucose-Capillary: 269 mg/dL — ABNORMAL HIGH (ref 70–99)

## 2020-10-25 LAB — CBC
HCT: 28.6 % — ABNORMAL LOW (ref 36.0–46.0)
Hemoglobin: 9.4 g/dL — ABNORMAL LOW (ref 12.0–15.0)
MCH: 29.5 pg (ref 26.0–34.0)
MCHC: 32.9 g/dL (ref 30.0–36.0)
MCV: 89.7 fL (ref 80.0–100.0)
Platelets: 447 10*3/uL — ABNORMAL HIGH (ref 150–400)
RBC: 3.19 MIL/uL — ABNORMAL LOW (ref 3.87–5.11)
RDW: 14.1 % (ref 11.5–15.5)
WBC: 20.2 10*3/uL — ABNORMAL HIGH (ref 4.0–10.5)
nRBC: 0 % (ref 0.0–0.2)

## 2020-10-25 MED ORDER — AMLODIPINE BESYLATE 10 MG PO TABS
10.0000 mg | ORAL_TABLET | Freq: Every day | ORAL | Status: DC
  Administered 2020-10-25 – 2020-10-28 (×4): 10 mg via ORAL
  Filled 2020-10-25 (×4): qty 1

## 2020-10-25 MED ORDER — DOCUSATE SODIUM 100 MG PO CAPS
200.0000 mg | ORAL_CAPSULE | Freq: Two times a day (BID) | ORAL | Status: DC
  Administered 2020-10-25 – 2020-10-28 (×7): 200 mg via ORAL
  Filled 2020-10-25 (×7): qty 2

## 2020-10-25 MED ORDER — TORSEMIDE 20 MG PO TABS
20.0000 mg | ORAL_TABLET | Freq: Every day | ORAL | Status: DC
  Administered 2020-10-26 – 2020-10-28 (×3): 20 mg via ORAL
  Filled 2020-10-25 (×3): qty 1

## 2020-10-25 MED ORDER — BISACODYL 5 MG PO TBEC: 5.0000 mg | DELAYED_RELEASE_TABLET | Freq: Every day | ORAL | Status: DC | PRN

## 2020-10-25 MED ORDER — MECLIZINE HCL 12.5 MG PO TABS
12.5000 mg | ORAL_TABLET | Freq: Two times a day (BID) | ORAL | Status: DC | PRN
  Filled 2020-10-25: qty 1

## 2020-10-25 MED ORDER — CALCIUM CARBONATE 1250 (500 CA) MG PO TABS
1250.0000 mg | ORAL_TABLET | Freq: Every day | ORAL | Status: DC
  Administered 2020-10-26 – 2020-10-28 (×3): 1250 mg via ORAL
  Filled 2020-10-25 (×5): qty 1

## 2020-10-25 MED ORDER — LABETALOL HCL 5 MG/ML IV SOLN
10.0000 mg | Freq: Once | INTRAVENOUS | Status: AC
  Administered 2020-10-25: 10 mg via INTRAVENOUS
  Filled 2020-10-25: qty 4

## 2020-10-25 MED ORDER — PREDNISONE 20 MG PO TABS
20.0000 mg | ORAL_TABLET | Freq: Every day | ORAL | Status: DC
  Administered 2020-10-26 – 2020-10-28 (×3): 20 mg via ORAL
  Filled 2020-10-25 (×3): qty 1

## 2020-10-25 MED ORDER — LABETALOL HCL 5 MG/ML IV SOLN
10.0000 mg | INTRAVENOUS | Status: DC | PRN
  Administered 2020-10-25 – 2020-10-26 (×2): 10 mg via INTRAVENOUS
  Filled 2020-10-25 (×3): qty 4

## 2020-10-25 NOTE — Plan of Care (Signed)
  Problem: Education: Goal: Ability to demonstrate management of disease process will improve Outcome: Progressing Goal: Ability to verbalize understanding of medication therapies will improve Outcome: Progressing   Problem: Activity: Goal: Capacity to carry out activities will improve Outcome: Progressing   Problem: Education: Goal: Knowledge of General Education information will improve Description: Including pain rating scale, medication(s)/side effects and non-pharmacologic comfort measures Outcome: Progressing   

## 2020-10-25 NOTE — Progress Notes (Signed)
[facility]  ROUNDING NOTE   Subjective:   Patient is sitting up in the chair.  Denies any acute complaints Reports that her lower extremity edema is improving Overall she feels depressed about her health situation.  She has multiple chronic medical issues including diabetic retinopathy, poor vision in the left eye, now chronic kidney disease.  She is worried about going on dialysis.  Objective:  Vital signs in last 24 hours:  Temp:  [98.1 F (36.7 C)-99.2 F (37.3 C)] 98.7 F (37.1 C) (11/06 0743) Pulse Rate:  [72-80] 79 (11/06 0743) Resp:  [16-20] 16 (11/06 0743) BP: (164-184)/(82-94) 175/89 (11/06 0743) SpO2:  [100 %] 100 % (11/06 0743) Weight:  [85.6 kg] 85.6 kg (11/06 0548)  Weight change: 1.4 kg Filed Weights   10/23/20 0154 10/24/20 0441 10/25/20 0548  Weight: 87.1 kg 84.2 kg 85.6 kg    Intake/Output: I/O last 3 completed shifts: In: 240 [P.O.:240] Out: 2300 [Urine:2300]   Intake/Output this shift:  No intake/output data recorded.  Physical Exam: General: Resting in bed,In no acute distress  Head: Moist oral mucosal membranes  Eyes: Sclerae and conjunctivae clear  Lungs:  Respirations even,unlabored,Lungs clear  Heart: S1S2,no rubs or gallops  Abdomen:  Soft, nontender, non distended  Extremities:  +peripheral edema.  Neurologic: Alert,awake,oriented  Skin: No acute lesions or rashes    Basic Metabolic Panel: Recent Labs  Lab 10/21/20 1931 10/21/20 1931 10/22/20 0500 10/22/20 0500 10/23/20 0350 10/24/20 0342 10/25/20 0343  NA 142  --  143  --  141 137 138  K 3.7  --  3.3*  --  3.2* 3.1* 3.6  CL 110  --  111  --  110 103 103  CO2 18*  --  21*  --  23 22 23   GLUCOSE 222*  --  261*  --  196* 213* 363*  BUN 44*  --  43*  --  46* 47* 51*  CREATININE 3.37*  --  3.37*  --  3.69* 3.92* 4.01*  CALCIUM 8.2*   < > 8.3*   < > 7.9* 8.2* 8.3*  MG  --   --  1.8  --   --   --   --    < > = values in this interval not displayed.    Liver  Function Tests: Recent Labs  Lab 10/21/20 1931  AST 17  ALT 8  ALKPHOS 70  BILITOT 1.1  PROT 6.7  ALBUMIN 2.4*   No results for input(s): LIPASE, AMYLASE in the last 168 hours. No results for input(s): AMMONIA in the last 168 hours.  CBC: Recent Labs  Lab 10/21/20 1931 10/22/20 0500 10/23/20 0350 10/24/20 0342 10/25/20 0343  WBC 12.2* 9.8 10.0 12.8* 20.2*  NEUTROABS 9.3*  --   --   --   --   HGB 10.0* 9.3* 8.5* 8.9* 9.4*  HCT 31.1* 28.7* 25.7* 27.8* 28.6*  MCV 92.0 90.3 89.2 91.1 89.7  PLT 376 381 374 438* 447*    Cardiac Enzymes: No results for input(s): CKTOTAL, CKMB, CKMBINDEX, TROPONINI in the last 168 hours.  BNP: Invalid input(s): POCBNP  CBG: Recent Labs  Lab 10/23/20 2058 10/24/20 0806 10/24/20 1134 10/24/20 1631 10/24/20 2032  GLUCAP 153* 178* 164* 185* 307*    Microbiology: Results for orders placed or performed during the hospital encounter of 10/21/20  Respiratory Panel by RT PCR (Flu A&B, Covid) - Nasopharyngeal Swab     Status: None   Collection Time: 10/21/20 11:51 PM  Specimen: Nasopharyngeal Swab  Result Value Ref Range Status   SARS Coronavirus 2 by RT PCR NEGATIVE NEGATIVE Final    Comment: (NOTE) SARS-CoV-2 target nucleic acids are NOT DETECTED.  The SARS-CoV-2 RNA is generally detectable in upper respiratoy specimens during the acute phase of infection. The lowest concentration of SARS-CoV-2 viral copies this assay can detect is 131 copies/mL. A negative result does not preclude SARS-Cov-2 infection and should not be used as the sole basis for treatment or other patient management decisions. A negative result may occur with  improper specimen collection/handling, submission of specimen other than nasopharyngeal swab, presence of viral mutation(s) within the areas targeted by this assay, and inadequate number of viral copies (<131 copies/mL). A negative result must be combined with clinical observations, patient history, and  epidemiological information. The expected result is Negative.  Fact Sheet for Patients:  PinkCheek.be  Fact Sheet for Healthcare Providers:  GravelBags.it  This test is no t yet approved or cleared by the [place] FDA and  has been authorized for detection and/or diagnosis of SARS-CoV-2 by FDA under an Emergency Use Authorization (EUA). This EUA will remain  in effect (meaning this test can be used) for the duration of the COVID-19 declaration under Section 564(b)(1) of the Act, 21 U.S.C. section 360bbb-3(b)(1), unless the authorization is terminated or revoked sooner.     Influenza A by PCR NEGATIVE NEGATIVE Final   Influenza B by PCR NEGATIVE NEGATIVE Final    Comment: (NOTE) The Xpert Xpress SARS-CoV-2/FLU/RSV assay is intended as an aid in  the diagnosis of influenza from Nasopharyngeal swab specimens and  should not be used as a sole basis for treatment. Nasal washings and  aspirates are unacceptable for Xpert Xpress SARS-CoV-2/FLU/RSV  testing.  Fact Sheet for Patients: PinkCheek.be  Fact Sheet for Healthcare Providers: GravelBags.it  This test is not yet approved or cleared by the [place] FDA and  has been authorized for detection and/or diagnosis of SARS-CoV-2 by  FDA under an Emergency Use Authorization (EUA). This EUA will remain  in effect (meaning this test can be used) for the duration of the  Covid-19 declaration under Section 564(b)(1) of the Act, 21  U.S.C. section 360bbb-3(b)(1), unless the authorization is  terminated or revoked. Performed at [facility], [address] [place], [place] [address]     Coagulation Studies: No results for input(s): LABPROT, INR in the last 72 hours.  Urinalysis: No results for input(s): COLORURINE, LABSPEC, PHURINE, GLUCOSEU, HGBUR, BILIRUBINUR, KETONESUR, PROTEINUR, UROBILINOGEN,  NITRITE, LEUKOCYTESUR in the last 72 hours.  Invalid input(s): APPERANCEUR    Imaging: CT THORACIC SPINE WO CONTRAST  Result Date: 10/24/2020 CLINICAL DATA:  Mid back pain EXAM: CT THORACIC SPINE WITHOUT CONTRAST TECHNIQUE: Multidetector CT images of the thoracic were obtained using the standard protocol without intravenous contrast. COMPARISON:  CT chest 01/22/2020 FINDINGS: Alignment: Normal Vertebrae: Negative for fracture or mass. No evidence of spinal infection. Paraspinal and other soft tissues: Negative for paraspinous No fluid mass collection or edema. Small bilateral pleural effusions. Diffuse mild bilateral airspace disease. Disc levels: No significant disc space narrowing. No disc degeneration or spurring identified. Negative for spinal stenosis. IMPRESSION: Negative CT thoracic spine Bilateral airspace disease and bilateral small pleural effusions which may represent congestive heart failure based on prior chest x-ray. Electronically Signed   By: Franchot Gallo M.D.   On: 10/24/2020 15:14   CT LUMBAR SPINE WO CONTRAST  Result Date: 10/24/2020 CLINICAL DATA:  Low back pain EXAM: CT  LUMBAR SPINE WITHOUT CONTRAST TECHNIQUE: Multidetector CT imaging of the lumbar spine was performed without intravenous contrast administration. Multiplanar CT image reconstructions were also generated. COMPARISON:  CT abdomen pelvis 09/12/2020 FINDINGS: Segmentation: Normal Alignment: Normal Vertebrae: Negative for fracture or mass. No evidence of spinal infection. Paraspinal and other soft tissues: Atherosclerotic abdominal aorta without aneurysm. Lobular contours to the left renal cortex unchanged. Mild left hydronephrosis unchanged. No obstructing calculus. No paraspinous soft tissue edema or fluid collection. Disc levels: L1-2: Negative L2-3: Mild disc and facet degeneration. Negative for disc protrusion or stenosis. L3-4: Mild disc and mild facet degeneration. Negative for disc protrusion or stenosis L4-5:  Mild disc bulging and mild facet degeneration. Negative for disc protrusion or stenosis. L5-S1: Mild disc degeneration. Negative for disc protrusion or stenosis. IMPRESSION: Mild lumbar degenerative change.  No cause for acute lumbar pain. Electronically Signed   By: Franchot Gallo M.D.   On: 10/24/2020 15:23     Medications:    . heparin  5,000 Units Subcutaneous Q8H  . influenza vac split quadrivalent PF  0.5 mL Intramuscular Tomorrow-1000  . insulin aspart  0-5 Units Subcutaneous QHS  . insulin aspart  0-9 Units Subcutaneous TID WC  . labetalol  10 mg Intravenous Once  . mometasone-formoterol  2 puff Inhalation BID  . pneumococcal 23 valent vaccine  0.5 mL Intramuscular Tomorrow-1000  . predniSONE  80 mg Oral Q breakfast  . sodium bicarbonate  650 mg Oral BID  . sodium chloride flush  3 mL Intravenous Q12H   acetaminophen **OR** acetaminophen, cyclobenzaprine, ipratropium-albuterol, morphine injection, ondansetron **OR** ondansetron (ZOFRAN) IV, oxyCODONE  Assessment/ Plan:  [name] is a 59 y.o.  female with medical problems of coronary artery disease status post CABG in 2018 Stroke 2014 CKD Asthma Hypertension Hyperlipidemia Diabetes type 2, poorly controlled Pituitary tumor status post removal in 1992 Blindness in left eye Right perinephric hematoma status post kidney biopsy September 2021 Chronic mild hydronephrosis bilaterally  presented to the ED for worsening SOB, and dyspnea on exertion, has h/o CHF with EF 30 -35%,CKD Stage IV worsening to V, Diabetes and hypertension. She was admitted to the hospital recently for COPD exacerbation and pneumonia.  Renal biopsy from September 11, 2020 shows acute tubular injury with mild acute interstitial nephritis suggestive of a hypersensitivity etiology.  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  No evidence of immune complex  mediated or active GN.  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   # Acute Kidney Injury on CKD Stage IV  Baseline Creatinine 3.14 on 09/15/2020 with GFR 18  Lab Results  Component Value Date   CREATININE 4.01 (H) 10/25/2020   CREATININE 3.92 (H) 10/24/2020   CREATININE 3.69 (H) 10/23/2020   Recent renal biopsy on 09/11/20 shows acute interstitial nephritis hypersensitivity type as well as diabetic glomerulosclerosis. Currently getting treatment with prednisone.  Serum creatinine is worsening and is up to 4.01 today. Will reduce dose to 20 mg Trial of prednisone for 2-4 weeks for AIN.  Improvement is unlikely as patient has significant underlying diabetic nephrosclerosis. We will place on calcium supplements for prevention of osteoporosis Electrolytes and volume status are acceptable.  No acute indication for dialysis at present.  #Diabetes with CKD Lab Results  Component Value Date   HGBA1C 8.6 (H) 10/11/2020    Blood glucose readings above goal On Insulin   # Acute on chronic systolic CHF exacerbation: #Lower extremity edema EF 30-35% from  TTE on 02/27/2020 In no acute respiratory distress Will add low-dose torsemide for volume control   LOS: 4 Verlee Pope 11/6/20219:13 AM

## 2020-10-25 NOTE — Progress Notes (Signed)
   10/25/20 1744  Vitals  Temp 98.1 F (36.7 C)  Temp Source Oral  BP (!) 189/94  MAP (mmHg) 122  BP Location Left Arm  BP Method Automatic  Patient Position (if appropriate) Sitting  Pulse Rate 84  Pulse Rate Source Monitor  Resp 20  MEWS COLOR  MEWS Score Color Green  Oxygen Therapy  SpO2 99 %  O2 Device Room Air  MEWS Score  MEWS Temp 0  MEWS Systolic 0  MEWS Pulse 0  MEWS RR 0  MEWS LOC 0  MEWS Score 0   Patient c/o sharp CP that self resolved. ECG placed in chart. Dr. Jimmye Norman notified; Amlodipine ordered; see eMAR.

## 2020-10-25 NOTE — TOC Progression Note (Signed)
Transition of Care [facility]) - Progression Note    Patient Details  Name: MCKINZY FULLER MRN: [id number] Date of Birth: 1961/05/21  [facility] CM/SW Contact  Izola Price, RN Phone Number: 10/25/2020, 11:50 AM  Clinical Narrative:   Spoke with patient by room phone. Said she was feeling better today but stated she might have to have dialysis and waiting to speak to nephrology provider. Spoke with patient regarding [place] care is unable to provide the RN [facility] care but [facility] has accepted patient. Patient verbalized twice that [place] services was okay with patient. Informed her CM would continue to monitor and would be back in touch regarding further discharge planning. Anticipated discharge per provider notes is estimated to be around 10/27/20. Pt. Had no further questions or issues at this time. Simmie Davies RN CM    Expected Discharge Plan: [place] Barriers to Discharge: Continued Medical Work up  Expected Discharge Plan and Services Expected Discharge Plan: [place] In-house Referral: Clinical Social Work   Post Acute Care Choice: [place] arrangements for the past 2 months: [place]                 DME Arranged: Shower stool DME Agency: [employer] Date DME Agency Contacted: 10/23/20 Time DME Agency Contacted: [phone] Representative spoke with at DME Agency: [place]: [place] Determinants of Health ([place]) Interventions    Readmission Risk Interventions Readmission Risk Prevention Plan 10/23/2020 10/12/2020  Transportation Screening Complete Complete  Medication Review Press photographer) Complete Complete  PCP or Specialist appointment within 3-5 days of discharge Complete Complete  HRI or Home Care Consult Complete -  SW Recovery Care/Counseling Consult Complete Complete  Palliative Care Screening Not Applicable Not [place] Not Applicable Not Complete  SNF Comments - Patient pending evaluation  Some recent data might be hidden

## 2020-10-25 NOTE — Progress Notes (Signed)
PROGRESS NOTE    Terry Arroyo  YQM:[id number] DOB: 03-23-1961 DOA: 10/21/2020 PCP: Kirk Ruths, MD    Assessment & Plan:   Principal Problem:   Acute on chronic systolic congestive heart failure ([place]) Active Problems:   DM2 (diabetes mellitus, type 2) (HCC)   CKD (chronic kidney disease) stage 4, GFR 15-29 ml/min (HCC)   Hypertension associated with diabetes ([place])   CAD (coronary artery disease)   Hyperlipidemia associated with type 2 diabetes mellitus ([place])   OSA (obstructive sleep apnea)  AKI on CKD IV: w/ hx of nephrotic syndrome. Cr continues to trend up daily despite w/holding nephrotoxic meds. Continue on prednisone for acute interstitial nephritis as per nephro. Hold lasix, coreg, imdur, aspirin and any other nephrotoxic meds as pt's kidney biopsy showed hypersensitivity reaction. Nephro following and recs apprec  Acute on chronic systolic CHF exacerbation: echo showed EF 30-35% 02/27/2020.Neg approx 1.1L. Strict I/Os & daily weights. Initially required BiPAP and now currently on 2L [place]. Hold lasix secondary to AKI on CKD. Not on ACE/ARB/spironolactone due to renal dysfunction  Thrombocytosis: etiology unclear. Will continue to monitor   CAD: s/p CABG. Continue to hold home dose of aspirin, imdur, statin, coreg as pt's kidney biopsy showed hypersensitivity   COPD: w/o exacerbation. Continue on bronchodilators and encourage incentive spirometry   Likely BMW:UXLKGM secondary to CKD. No need for a transfusion at this time   Hypokalemia: WNL today.   DM2: HbA1c 8.6, poorly controlled. Continue on SSI w/ accuchecks  HTN: continue to hold anti-HTN secondary to pt's kidney biopsy showing hypersensitivity reaction    HLD: continue to hold statin   OSA: does not use CPAP at home   Chronic back pain: CT L spine shows mild degenerative change. No cause of acute lumbar pain. Morphine, oxycodone prn  Constipation: colace, dulcolax prn   DVT prophylaxis: heparin     Code Status: full  Family Communication:  Disposition Plan: likely d/c back home.  Status is: Inpatient  Remains inpatient appropriate because:Ongoing diagnostic testing needed not appropriate for outpatient work up and IV treatments appropriate due to intensity of illness or inability to take PO, will possibly need HD as Cr continues to trend up     Dispo: The patient is from: Home              Anticipated d/c is to: Home              Anticipated d/c date is: 3 days              Patient currently is not medically stable to d/c.     Consultants:   nephro    Procedures:   Antimicrobials:   Subjective: Pt c/o back pain   Objective: Vitals:   10/24/20 1602 10/24/20 1931 10/25/20 0409 10/25/20 0548  BP: (!) 164/93 (!) 168/83 (!) 184/94   Pulse: 75 78 80   Resp: 20 17 18    Temp: 99.2 F (37.3 C) 98.8 F (37.1 C) 98.1 F (36.7 C)   TempSrc: Oral Oral Oral   SpO2: 100% 100% 100%   Weight:    85.6 kg    Intake/Output Summary (Last 24 hours) at 10/25/2020 0723 Last data filed at 10/25/2020 0420 Gross per 24 hour  Intake 240 ml  Output 1600 ml  Net -1360 ml   Filed Weights   10/23/20 0154 10/24/20 0441 10/25/20 0548  Weight: 87.1 kg 84.2 kg 85.6 kg    Examination:  General exam: Appears calm &  comfortable  Respiratory system: decreased breath sounds b/l  Cardiovascular system: S1/S2+. No rubs or gallops Gastrointestinal system: Abdomen is nondistended, soft and nontender. Hypoactive bowel sounds  Central nervous system: Appears alert & oriented. Moves all 4 extremities  Psychiatry: judgement and insight appear normal. Flat mood and affect     Data Reviewed: I have personally reviewed following labs and imaging studies  CBC: Recent Labs  Lab 10/21/20 1931 10/22/20 0500 10/23/20 0350 10/24/20 0342 10/25/20 0343  WBC 12.2* 9.8 10.0 12.8* 20.2*  NEUTROABS 9.3*  --   --   --   --   HGB 10.0* 9.3* 8.5* 8.9* 9.4*  HCT 31.1* 28.7* 25.7* 27.8* 28.6*  MCV  92.0 90.3 89.2 91.1 89.7  PLT 376 381 374 438* 932*   Basic Metabolic Panel: Recent Labs  Lab 10/21/20 1931 10/22/20 0500 10/23/20 0350 10/24/20 0342 10/25/20 0343  NA 142 143 141 137 138  K 3.7 3.3* 3.2* 3.1* 3.6  CL 110 111 110 103 103  CO2 18* 21* 23 22 23   GLUCOSE 222* 261* 196* 213* 363*  BUN 44* 43* 46* 47* 51*  CREATININE 3.37* 3.37* 3.69* 3.92* 4.01*  CALCIUM 8.2* 8.3* 7.9* 8.2* 8.3*  MG  --  1.8  --   --   --    GFR: Estimated Creatinine Clearance: 16.9 mL/min (A) (by C-G formula based on SCr of 4.01 mg/dL (H)). Liver Function Tests: Recent Labs  Lab 10/21/20 1931  AST 17  ALT 8  ALKPHOS 70  BILITOT 1.1  PROT 6.7  ALBUMIN 2.4*   No results for input(s): LIPASE, AMYLASE in the last 168 hours. No results for input(s): AMMONIA in the last 168 hours. Coagulation Profile: No results for input(s): INR, PROTIME in the last 168 hours. Cardiac Enzymes: No results for input(s): CKTOTAL, CKMB, CKMBINDEX, TROPONINI in the last 168 hours. BNP (last 3 results) No results for input(s): PROBNP in the last 8760 hours. HbA1C: No results for input(s): HGBA1C in the last 72 hours. CBG: Recent Labs  Lab 10/23/20 2058 10/24/20 0806 10/24/20 1134 10/24/20 1631 10/24/20 2032  GLUCAP 153* 178* 164* 185* 307*   Lipid Profile: No results for input(s): CHOL, HDL, LDLCALC, TRIG, CHOLHDL, LDLDIRECT in the last 72 hours. Thyroid Function Tests: No results for input(s): TSH, T4TOTAL, FREET4, T3FREE, THYROIDAB in the last 72 hours. Anemia Panel: No results for input(s): VITAMINB12, FOLATE, FERRITIN, TIBC, IRON, RETICCTPCT in the last 72 hours. Sepsis Labs: Recent Labs  Lab 10/21/20 1931  LATICACIDVEN 1.1    Recent Results (from the past 240 hour(s))  Respiratory Panel by RT PCR (Flu A&B, Covid) - Nasopharyngeal Swab     Status: None   Collection Time: 10/21/20 11:51 PM   Specimen: Nasopharyngeal Swab  Result Value Ref Range Status   SARS Coronavirus 2 by RT PCR  NEGATIVE NEGATIVE Final    Comment: (NOTE) SARS-CoV-2 target nucleic acids are NOT DETECTED.  The SARS-CoV-2 RNA is generally detectable in upper respiratoy specimens during the acute phase of infection. The lowest concentration of SARS-CoV-2 viral copies this assay can detect is 131 copies/mL. A negative result does not preclude SARS-Cov-2 infection and should not be used as the sole basis for treatment or other patient management decisions. A negative result may occur with  improper specimen collection/handling, submission of specimen other than nasopharyngeal swab, presence of viral mutation(s) within the areas targeted by this assay, and inadequate number of viral copies (<131 copies/mL). A negative result must be combined with clinical observations,  patient history, and epidemiological information. The expected result is Negative.  Fact Sheet for Patients:  PinkCheek.be  Fact Sheet for Healthcare Providers:  GravelBags.it  This test is no t yet approved or cleared by the [place] FDA and  has been authorized for detection and/or diagnosis of SARS-CoV-2 by FDA under an Emergency Use Authorization (EUA). This EUA will remain  in effect (meaning this test can be used) for the duration of the COVID-19 declaration under Section 564(b)(1) of the Act, 21 U.S.C. section 360bbb-3(b)(1), unless the authorization is terminated or revoked sooner.     Influenza A by PCR NEGATIVE NEGATIVE Final   Influenza B by PCR NEGATIVE NEGATIVE Final    Comment: (NOTE) The Xpert Xpress SARS-CoV-2/FLU/RSV assay is intended as an aid in  the diagnosis of influenza from Nasopharyngeal swab specimens and  should not be used as a sole basis for treatment. Nasal washings and  aspirates are unacceptable for Xpert Xpress SARS-CoV-2/FLU/RSV  testing.  Fact Sheet for Patients: PinkCheek.be  Fact Sheet for  Healthcare Providers: GravelBags.it  This test is not yet approved or cleared by the [place] FDA and  has been authorized for detection and/or diagnosis of SARS-CoV-2 by  FDA under an Emergency Use Authorization (EUA). This EUA will remain  in effect (meaning this test can be used) for the duration of the  Covid-19 declaration under Section 564(b)(1) of the Act, 21  U.S.C. section 360bbb-3(b)(1), unless the authorization is  terminated or revoked. Performed at [facility], [address] [place], [place] [address]          Radiology Studies: CT THORACIC SPINE WO CONTRAST  Result Date: 10/24/2020 CLINICAL DATA:  Mid back pain EXAM: CT THORACIC SPINE WITHOUT CONTRAST TECHNIQUE: Multidetector CT images of the thoracic were obtained using the standard protocol without intravenous contrast. COMPARISON:  CT chest 01/22/2020 FINDINGS: Alignment: Normal Vertebrae: Negative for fracture or mass. No evidence of spinal infection. Paraspinal and other soft tissues: Negative for paraspinous No fluid mass collection or edema. Small bilateral pleural effusions. Diffuse mild bilateral airspace disease. Disc levels: No significant disc space narrowing. No disc degeneration or spurring identified. Negative for spinal stenosis. IMPRESSION: Negative CT thoracic spine Bilateral airspace disease and bilateral small pleural effusions which may represent congestive heart failure based on prior chest x-ray. Electronically Signed   By: Franchot Gallo M.D.   On: 10/24/2020 15:14   CT LUMBAR SPINE WO CONTRAST  Result Date: 10/24/2020 CLINICAL DATA:  Low back pain EXAM: CT LUMBAR SPINE WITHOUT CONTRAST TECHNIQUE: Multidetector CT imaging of the lumbar spine was performed without intravenous contrast administration. Multiplanar CT image reconstructions were also generated. COMPARISON:  CT abdomen pelvis 09/12/2020 FINDINGS: Segmentation: Normal Alignment: Normal Vertebrae:  Negative for fracture or mass. No evidence of spinal infection. Paraspinal and other soft tissues: Atherosclerotic abdominal aorta without aneurysm. Lobular contours to the left renal cortex unchanged. Mild left hydronephrosis unchanged. No obstructing calculus. No paraspinous soft tissue edema or fluid collection. Disc levels: L1-2: Negative L2-3: Mild disc and facet degeneration. Negative for disc protrusion or stenosis. L3-4: Mild disc and mild facet degeneration. Negative for disc protrusion or stenosis L4-5: Mild disc bulging and mild facet degeneration. Negative for disc protrusion or stenosis. L5-S1: Mild disc degeneration. Negative for disc protrusion or stenosis. IMPRESSION: Mild lumbar degenerative change.  No cause for acute lumbar pain. Electronically Signed   By: Franchot Gallo M.D.   On: 10/24/2020 15:23        Scheduled Meds: .  heparin  5,000 Units Subcutaneous Q8H  . influenza vac split quadrivalent PF  0.5 mL Intramuscular Tomorrow-1000  . insulin aspart  0-5 Units Subcutaneous QHS  . insulin aspart  0-9 Units Subcutaneous TID WC  . labetalol  10 mg Intravenous Once  . mometasone-formoterol  2 puff Inhalation BID  . pneumococcal 23 valent vaccine  0.5 mL Intramuscular Tomorrow-1000  . predniSONE  80 mg Oral Q breakfast  . sodium bicarbonate  650 mg Oral BID  . sodium chloride flush  3 mL Intravenous Q12H   Continuous Infusions:   LOS: 4 days    Time spent: 34 mins    Wyvonnia Dusky, MD [facility] Pager [phone]  If 7PM-7AM, please contact night-coverage [email or web address] 10/25/2020, 7:23 AM

## 2020-10-25 NOTE — Plan of Care (Signed)

## 2020-10-25 NOTE — Progress Notes (Addendum)
Pt BP at 183/93 HR 87. Pt was given amlodipine (NORVASC) at 1812 but unchanged. Notify NP Randol Kern. Will continue to monitor.  Update 204: NP Randol Kern ordered labetalol IV 10 mg every 4 hours PRN. Will continue to monitor.

## 2020-10-25 NOTE — Plan of Care (Signed)

## 2020-10-25 NOTE — Progress Notes (Signed)
[place] paged to provide support to pt.; when [facility] arrived, pt. sitting up in bed watching TV; pt. shared that she has been dealing with numerous health issues over the last few years; she has had two pituitary tumors removed and can no longer see out of one eye; Pt. had COVID in early 2020 while visiting her brother in [place]; she suffered a stroke which left her left side weak; she came to hospital this admission with shortness of breath and back pain but has also been recently diagnosed with kidney disease; pt. somewhat dejected and is trying to process this new diagnosis and the possibility of having to go on dialysis.  Pt. recently moved from living with her father in [place] to living in an apartment in a retirement community nearby and she says she loves this new living situation.  [facility] provided extended supportive listening and helped pt. contact meal service to order home food delivery to stop until she returns from hospital; [facility] prayed for pt.'s sense of divine presence and peace to be felt.  [facility] remains available as needed.

## 2020-10-26 LAB — GLUCOSE, CAPILLARY
Glucose-Capillary: 373 mg/dL — ABNORMAL HIGH (ref 70–99)
Glucose-Capillary: 332 mg/dL — ABNORMAL HIGH (ref 70–99)
Glucose-Capillary: 325 mg/dL — ABNORMAL HIGH (ref 70–99)
Glucose-Capillary: 349 mg/dL — ABNORMAL HIGH (ref 70–99)

## 2020-10-26 LAB — CBC
HCT: 30.4 % — ABNORMAL LOW (ref 36.0–46.0)
Hemoglobin: 10.1 g/dL — ABNORMAL LOW (ref 12.0–15.0)
MCH: 29.5 pg (ref 26.0–34.0)
MCHC: 33.2 g/dL (ref 30.0–36.0)
MCV: 88.9 fL (ref 80.0–100.0)
Platelets: 466 10*3/uL — ABNORMAL HIGH (ref 150–400)
RBC: 3.42 MIL/uL — ABNORMAL LOW (ref 3.87–5.11)
RDW: 14.2 % (ref 11.5–15.5)
WBC: 18.9 10*3/uL — ABNORMAL HIGH (ref 4.0–10.5)
nRBC: 0 % (ref 0.0–0.2)

## 2020-10-26 LAB — BASIC METABOLIC PANEL
Anion gap: 12 (ref 5–15)
BUN: 63 mg/dL — ABNORMAL HIGH (ref 6–20)
CO2: 21 mmol/L — ABNORMAL LOW (ref 22–32)
Calcium: 8.8 mg/dL — ABNORMAL LOW (ref 8.9–10.3)
Chloride: 99 mmol/L (ref 98–111)
Creatinine, Ser: 3.95 mg/dL — ABNORMAL HIGH (ref 0.44–1.00)
GFR, Estimated: 13 mL/min — ABNORMAL LOW (ref >60)
Glucose, Bld: 372 mg/dL — ABNORMAL HIGH (ref 70–99)
Potassium: 3.7 mmol/L (ref 3.5–5.1)
Sodium: 132 mmol/L — ABNORMAL LOW (ref 135–145)

## 2020-10-26 MED ORDER — PANTOPRAZOLE SODIUM 40 MG PO TBEC
40.0000 mg | DELAYED_RELEASE_TABLET | Freq: Every day | ORAL | Status: DC
  Administered 2020-10-26 – 2020-10-28 (×3): 40 mg via ORAL
  Filled 2020-10-26 (×3): qty 1

## 2020-10-26 MED ORDER — PREDNISOLONE ACETATE 1 % OP SUSP
1.0000 [drp] | Freq: Four times a day (QID) | OPHTHALMIC | Status: DC
  Administered 2020-10-26 – 2020-10-28 (×10): 1 [drp] via OPHTHALMIC
  Filled 2020-10-26 (×2): qty 1

## 2020-10-26 NOTE — Plan of Care (Signed)

## 2020-10-26 NOTE — Plan of Care (Signed)
  Problem: Education: Goal: Ability to demonstrate management of disease process will improve Outcome: Progressing Goal: Ability to verbalize understanding of medication therapies will improve Outcome: Progressing   Problem: Activity: Goal: Capacity to carry out activities will improve Outcome: Progressing   Problem: Education: Goal: Knowledge of General Education information will improve Description: Including pain rating scale, medication(s)/side effects and non-pharmacologic comfort measures Outcome: Progressing   

## 2020-10-26 NOTE — Progress Notes (Signed)
[facility]  ROUNDING NOTE   Subjective:   Patient is sitting up in the bed.  Denies any acute complaints Reports that her lower extremity edema is improving  in better mood today Able to eat without nausea or vomiting   Objective:  Vital signs in last 24 hours:  Temp:  [97.7 F (36.5 C)-98.6 F (37 C)] 98.4 F (36.9 C) (11/07 1153) Pulse Rate:  [69-84] 78 (11/07 1153) Resp:  [17-20] 20 (11/07 1153) BP: (162-189)/(84-97) 175/95 (11/07 1153) SpO2:  [96 %-100 %] 96 % (11/07 1153) Weight:  [87.5 kg] 87.5 kg (11/07 0436)  Weight change: 1.854 kg Filed Weights   10/24/20 0441 10/25/20 0548 10/26/20 0436  Weight: 84.2 kg 85.6 kg 87.5 kg    Intake/Output: I/O last 3 completed shifts: In: 960 [P.O.:960] Out: 2300 [Urine:2300]   Intake/Output this shift:  Total I/O In: 240 [P.O.:240] Out: 700 [Urine:700]  Physical Exam: General: Resting in bed,In no acute distress  Head: Moist oral mucosal membranes  Eyes: Sclerae and conjunctivae clear  Lungs:  Respirations even,unlabored,Lungs clear  Heart: S1S2,no rubs or gallops  Abdomen:  Soft, nontender, non distended  Extremities:  +peripheral edema.  Neurologic: Alert,awake,oriented  Skin: No acute lesions or rashes    Basic Metabolic Panel: Recent Labs  Lab 10/22/20 0500 10/22/20 0500 10/23/20 0350 10/23/20 0350 10/24/20 0342 10/25/20 0343 10/26/20 0645  NA 143  --  141  --  137 138 132*  K 3.3*  --  3.2*  --  3.1* 3.6 3.7  CL 111  --  110  --  103 103 99  CO2 21*  --  23  --  22 23 21*  GLUCOSE 261*  --  196*  --  213* 363* 372*  BUN 43*  --  46*  --  47* 51* 63*  CREATININE 3.37*  --  3.69*  --  3.92* 4.01* 3.95*  CALCIUM 8.3*   < > 7.9*   < > 8.2* 8.3* 8.8*  MG 1.8  --   --   --   --   --   --    < > = values in this interval not displayed.    Liver Function Tests: Recent Labs  Lab 10/21/20 1931  AST 17  ALT 8  ALKPHOS 70  BILITOT 1.1  PROT 6.7  ALBUMIN 2.4*   No results for  input(s): LIPASE, AMYLASE in the last 168 hours. No results for input(s): AMMONIA in the last 168 hours.  CBC: Recent Labs  Lab 10/21/20 1931 10/21/20 1931 10/22/20 0500 10/23/20 0350 10/24/20 0342 10/25/20 0343 10/26/20 0645  WBC 12.2*   < > 9.8 10.0 12.8* 20.2* 18.9*  NEUTROABS 9.3*  --   --   --   --   --   --   HGB 10.0*   < > 9.3* 8.5* 8.9* 9.4* 10.1*  HCT 31.1*   < > 28.7* 25.7* 27.8* 28.6* 30.4*  MCV 92.0   < > 90.3 89.2 91.1 89.7 88.9  PLT 376   < > 381 374 438* 447* 466*   < > = values in this interval not displayed.    Cardiac Enzymes: No results for input(s): CKTOTAL, CKMB, CKMBINDEX, TROPONINI in the last 168 hours.  BNP: Invalid input(s): POCBNP  CBG: Recent Labs  Lab 10/25/20 1150 10/25/20 1646 10/25/20 1959 10/26/20 0755 10/26/20 1153  GLUCAP 313* 234* 269* 373* 332*    Microbiology: Results for orders placed or performed during the hospital encounter  of 10/21/20  Respiratory Panel by RT PCR (Flu A&B, Covid) - Nasopharyngeal Swab     Status: None   Collection Time: 10/21/20 11:51 PM   Specimen: Nasopharyngeal Swab  Result Value Ref Range Status   SARS Coronavirus 2 by RT PCR NEGATIVE NEGATIVE Final    Comment: (NOTE) SARS-CoV-2 target nucleic acids are NOT DETECTED.  The SARS-CoV-2 RNA is generally detectable in upper respiratoy specimens during the acute phase of infection. The lowest concentration of SARS-CoV-2 viral copies this assay can detect is 131 copies/mL. A negative result does not preclude SARS-Cov-2 infection and should not be used as the sole basis for treatment or other patient management decisions. A negative result may occur with  improper specimen collection/handling, submission of specimen other than nasopharyngeal swab, presence of viral mutation(s) within the areas targeted by this assay, and inadequate number of viral copies (<131 copies/mL). A negative result must be combined with clinical observations, patient history,  and epidemiological information. The expected result is Negative.  Fact Sheet for Patients:  PinkCheek.be  Fact Sheet for Healthcare Providers:  GravelBags.it  This test is no t yet approved or cleared by the [place] FDA and  has been authorized for detection and/or diagnosis of SARS-CoV-2 by FDA under an Emergency Use Authorization (EUA). This EUA will remain  in effect (meaning this test can be used) for the duration of the COVID-19 declaration under Section 564(b)(1) of the Act, 21 U.S.C. section 360bbb-3(b)(1), unless the authorization is terminated or revoked sooner.     Influenza A by PCR NEGATIVE NEGATIVE Final   Influenza B by PCR NEGATIVE NEGATIVE Final    Comment: (NOTE) The Xpert Xpress SARS-CoV-2/FLU/RSV assay is intended as an aid in  the diagnosis of influenza from Nasopharyngeal swab specimens and  should not be used as a sole basis for treatment. Nasal washings and  aspirates are unacceptable for Xpert Xpress SARS-CoV-2/FLU/RSV  testing.  Fact Sheet for Patients: PinkCheek.be  Fact Sheet for Healthcare Providers: GravelBags.it  This test is not yet approved or cleared by the [place] FDA and  has been authorized for detection and/or diagnosis of SARS-CoV-2 by  FDA under an Emergency Use Authorization (EUA). This EUA will remain  in effect (meaning this test can be used) for the duration of the  Covid-19 declaration under Section 564(b)(1) of the Act, 21  U.S.C. section 360bbb-3(b)(1), unless the authorization is  terminated or revoked. Performed at [facility], [address] [place], [place] [address]     Coagulation Studies: No results for input(s): LABPROT, INR in the last 72 hours.  Urinalysis: No results for input(s): COLORURINE, LABSPEC, PHURINE, GLUCOSEU, HGBUR, BILIRUBINUR, KETONESUR, PROTEINUR, UROBILINOGEN,  NITRITE, LEUKOCYTESUR in the last 72 hours.  Invalid input(s): APPERANCEUR    Imaging: CT THORACIC SPINE WO CONTRAST  Result Date: 10/24/2020 CLINICAL DATA:  Mid back pain EXAM: CT THORACIC SPINE WITHOUT CONTRAST TECHNIQUE: Multidetector CT images of the thoracic were obtained using the standard protocol without intravenous contrast. COMPARISON:  CT chest 01/22/2020 FINDINGS: Alignment: Normal Vertebrae: Negative for fracture or mass. No evidence of spinal infection. Paraspinal and other soft tissues: Negative for paraspinous No fluid mass collection or edema. Small bilateral pleural effusions. Diffuse mild bilateral airspace disease. Disc levels: No significant disc space narrowing. No disc degeneration or spurring identified. Negative for spinal stenosis. IMPRESSION: Negative CT thoracic spine Bilateral airspace disease and bilateral small pleural effusions which may represent congestive heart failure based on prior chest x-ray. Electronically Signed  By: Franchot Gallo M.D.   On: 10/24/2020 15:14   CT LUMBAR SPINE WO CONTRAST  Result Date: 10/24/2020 CLINICAL DATA:  Low back pain EXAM: CT LUMBAR SPINE WITHOUT CONTRAST TECHNIQUE: Multidetector CT imaging of the lumbar spine was performed without intravenous contrast administration. Multiplanar CT image reconstructions were also generated. COMPARISON:  CT abdomen pelvis 09/12/2020 FINDINGS: Segmentation: Normal Alignment: Normal Vertebrae: Negative for fracture or mass. No evidence of spinal infection. Paraspinal and other soft tissues: Atherosclerotic abdominal aorta without aneurysm. Lobular contours to the left renal cortex unchanged. Mild left hydronephrosis unchanged. No obstructing calculus. No paraspinous soft tissue edema or fluid collection. Disc levels: L1-2: Negative L2-3: Mild disc and facet degeneration. Negative for disc protrusion or stenosis. L3-4: Mild disc and mild facet degeneration. Negative for disc protrusion or stenosis L4-5:  Mild disc bulging and mild facet degeneration. Negative for disc protrusion or stenosis. L5-S1: Mild disc degeneration. Negative for disc protrusion or stenosis. IMPRESSION: Mild lumbar degenerative change.  No cause for acute lumbar pain. Electronically Signed   By: Franchot Gallo M.D.   On: 10/24/2020 15:23     Medications:    . amLODipine  10 mg Oral Daily  . calcium carbonate  1,250 mg Oral Q breakfast  . docusate sodium  200 mg Oral BID  . heparin  5,000 Units Subcutaneous Q8H  . influenza vac split quadrivalent PF  0.5 mL Intramuscular Tomorrow-1000  . insulin aspart  0-5 Units Subcutaneous QHS  . insulin aspart  0-9 Units Subcutaneous TID WC  . mometasone-formoterol  2 puff Inhalation BID  . pantoprazole  40 mg Oral Daily  . prednisoLONE acetate  1 drop Right Eye QID  . predniSONE  20 mg Oral Q breakfast  . sodium bicarbonate  650 mg Oral BID  . sodium chloride flush  3 mL Intravenous Q12H  . torsemide  20 mg Oral Daily   acetaminophen **OR** acetaminophen, bisacodyl, cyclobenzaprine, ipratropium-albuterol, labetalol, meclizine, morphine injection, ondansetron **OR** ondansetron (ZOFRAN) IV, oxyCODONE  Assessment/ Plan:  Terry Arroyo is a 59 y.o.  female with medical problems of coronary artery disease status post CABG in 2018 Stroke 2014 CKD Asthma Hypertension Hyperlipidemia Diabetes type 2, poorly controlled Pituitary tumor status post removal in 1992 Blindness in left eye Right perinephric hematoma status post kidney biopsy September 2021 Chronic mild hydronephrosis bilaterally  presented to the ED for worsening SOB, and dyspnea on exertion, has h/o CHF with EF 30 -35%,CKD Stage IV worsening to V, Diabetes and hypertension. She was admitted to the hospital recently for COPD exacerbation and pneumonia.  Renal biopsy from September 11, 2020 shows acute tubular injury with mild acute interstitial nephritis suggestive of a hypersensitivity etiology.  Diabetic  glomerulosclerosis in association with FSGS, mild to moderate tubulointerstitial scarring and moderate arterionephrosclerosis.  No evidence of immune complex mediated or active GN.  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   # Acute Kidney Injury on CKD Stage IV  Baseline Creatinine 3.14 on 09/15/2020 with GFR 18  Lab Results  Component Value Date   CREATININE 3.95 (H) 10/26/2020   CREATININE 4.01 (H) 10/25/2020   CREATININE 3.92 (H) 10/24/2020   Recent renal biopsy on 09/11/20 shows acute interstitial nephritis hypersensitivity type as well as diabetic glomerulosclerosis. Currently getting treatment with prednisone.   Trial of prednisone for 2-4 weeks for AIN.  Improvement is unlikely as patient has significant underlying diabetic nephrosclerosis. We will place on calcium supplements for prevention of osteoporosis Electrolytes and volume status  are acceptable.  No acute indication for dialysis at present.  #Diabetes type 2 with CKD Lab Results  Component Value Date   HGBA1C 8.6 (H) 10/11/2020    Blood glucose readings above goal On Insulin   # Acute on chronic systolic CHF exacerbation: #Lower extremity edema EF 30-35% from TTE on 02/27/2020 In no acute respiratory distress Continue  low-dose torsemide for volume control   LOS: 5 Kenia Teagarden 11/7/202112:17 PM

## 2020-10-26 NOTE — Progress Notes (Signed)
PROGRESS NOTE    Terry Arroyo  UKG:[id number] DOB: 1961/07/02 DOA: 10/21/2020 PCP: Kirk Ruths, MD    Assessment & Plan:   Principal Problem:   Acute on chronic systolic congestive heart failure ([place]) Active Problems:   DM2 (diabetes mellitus, type 2) (HCC)   CKD (chronic kidney disease) stage 4, GFR 15-29 ml/min (HCC)   Hypertension associated with diabetes ([place])   CAD (coronary artery disease)   Hyperlipidemia associated with type 2 diabetes mellitus ([place])   OSA (obstructive sleep apnea)  AKI on CKD IV: w/ hx of nephrotic syndrome. Cr is trending down slightly today. Continue on prednisone for acute interstitial nephritis as per nephro. Continue on torsemide as per nephro. Hold lasix, coreg, imdur, aspirin and any other nephrotoxic meds as pt's kidney biopsy showed hypersensitivity reaction. Nephro following and recs apprec  Acute on chronic systolic CHF exacerbation: echo showed EF 30-35% 02/27/2020. Continue on torsemide. Strict I/Os & daily weights. Initially required BiPAP and now currently on 2L [place]. Hold lasix secondary to AKI on CKD. Not on ACE/ARB/spironolactone due to renal dysfunction  Thrombocytosis: etiology unclear. Will continue to monitor    CAD: s/p CABG. Continue to hold home dose of aspirin, imdur, statin, coreg as pt's kidney biopsy showed hypersensitivity   COPD: w/o exacerbation. Continue on bronchodilators and encourage incentive spirometry   Likely JSE:GBTDVV secondary to CKD. Will transfuse if Hb <7.0.    Hypokalemia: within normal limits today.   DM2: poorly controlled, HbA1c 8.6. Continue on SSI w/ accuchecks.   HTN: continue on amlodipine   HLD: continue to hold statin   OSA: does not use CPAP at home   Chronic back pain: CT L spine shows mild degenerative change. No cause of acute lumbar pain. Morphine, oxycodone prn  Constipation: continue on colace, dulcolax  DVT prophylaxis: heparin   Code Status: full  Family Communication:    Disposition Plan: likely d/c back home.  Status is: Inpatient  Remains inpatient appropriate because:Ongoing diagnostic testing needed not appropriate for outpatient work up and IV treatments appropriate due to intensity of illness or inability to take PO, will possibly need HD as Cr continues to trend up     Dispo: The patient is from: Home              Anticipated d/c is to: Home              Anticipated d/c date is: 3 days              Patient currently is not medically stable to d/c.     Consultants:   nephro    Procedures:   Antimicrobials:   Subjective: Pt c/o fatigue   Objective: Vitals:   10/25/20 2220 10/26/20 0435 10/26/20 0436 10/26/20 0543  BP: (!) 165/84 (!) 173/91  (!) 162/87  Pulse: 79 74  69  Resp:  19    Temp: 98.1 F (36.7 C) 97.7 F (36.5 C)    TempSrc: Oral Oral    SpO2: 100% 100%    Weight:   87.5 kg     Intake/Output Summary (Last 24 hours) at 10/26/2020 0710 Last data filed at 10/26/2020 0501 Gross per 24 hour  Intake 960 ml  Output 1700 ml  Net -740 ml   Filed Weights   10/24/20 0441 10/25/20 0548 10/26/20 0436  Weight: 84.2 kg 85.6 kg 87.5 kg    Examination:  General exam: Appears calm & comfortable  Respiratory system: diminished breath sounds b/l  Cardiovascular system: S1/ S2+. No rubs or gallops Gastrointestinal system: Abdomen is nondistended, soft and nontender. Hypoactive bowel sounds  Central nervous system: Appears alert & oriented. Moves all 4 extremities  Psychiatry: Judgement and insight appear normal. Flat mood and affect     Data Reviewed: I have personally reviewed following labs and imaging studies  CBC: Recent Labs  Lab 10/21/20 1931 10/21/20 1931 10/22/20 0500 10/23/20 0350 10/24/20 0342 10/25/20 0343 10/26/20 0645  WBC 12.2*   < > 9.8 10.0 12.8* 20.2* 18.9*  NEUTROABS 9.3*  --   --   --   --   --   --   HGB 10.0*   < > 9.3* 8.5* 8.9* 9.4* 10.1*  HCT 31.1*   < > 28.7* 25.7* 27.8* 28.6* 30.4*   MCV 92.0   < > 90.3 89.2 91.1 89.7 88.9  PLT 376   < > 381 374 438* 447* 466*   < > = values in this interval not displayed.   Basic Metabolic Panel: Recent Labs  Lab 10/21/20 1931 10/22/20 0500 10/23/20 0350 10/24/20 0342 10/25/20 0343  NA 142 143 141 137 138  K 3.7 3.3* 3.2* 3.1* 3.6  CL 110 111 110 103 103  CO2 18* 21* 23 22 23   GLUCOSE 222* 261* 196* 213* 363*  BUN 44* 43* 46* 47* 51*  CREATININE 3.37* 3.37* 3.69* 3.92* 4.01*  CALCIUM 8.2* 8.3* 7.9* 8.2* 8.3*  MG  --  1.8  --   --   --    GFR: Estimated Creatinine Clearance: 17 mL/min (A) (by C-G formula based on SCr of 4.01 mg/dL (H)). Liver Function Tests: Recent Labs  Lab 10/21/20 1931  AST 17  ALT 8  ALKPHOS 70  BILITOT 1.1  PROT 6.7  ALBUMIN 2.4*   No results for input(s): LIPASE, AMYLASE in the last 168 hours. No results for input(s): AMMONIA in the last 168 hours. Coagulation Profile: No results for input(s): INR, PROTIME in the last 168 hours. Cardiac Enzymes: No results for input(s): CKTOTAL, CKMB, CKMBINDEX, TROPONINI in the last 168 hours. BNP (last 3 results) No results for input(s): PROBNP in the last 8760 hours. HbA1C: No results for input(s): HGBA1C in the last 72 hours. CBG: Recent Labs  Lab 10/24/20 2032 10/25/20 0932 10/25/20 1150 10/25/20 1646 10/25/20 1959  GLUCAP 307* 319* 313* 234* 269*   Lipid Profile: No results for input(s): CHOL, HDL, LDLCALC, TRIG, CHOLHDL, LDLDIRECT in the last 72 hours. Thyroid Function Tests: No results for input(s): TSH, T4TOTAL, FREET4, T3FREE, THYROIDAB in the last 72 hours. Anemia Panel: No results for input(s): VITAMINB12, FOLATE, FERRITIN, TIBC, IRON, RETICCTPCT in the last 72 hours. Sepsis Labs: Recent Labs  Lab 10/21/20 1931  LATICACIDVEN 1.1    Recent Results (from the past 240 hour(s))  Respiratory Panel by RT PCR (Flu A&B, Covid) - Nasopharyngeal Swab     Status: None   Collection Time: 10/21/20 11:51 PM   Specimen: Nasopharyngeal  Swab  Result Value Ref Range Status   SARS Coronavirus 2 by RT PCR NEGATIVE NEGATIVE Final    Comment: (NOTE) SARS-CoV-2 target nucleic acids are NOT DETECTED.  The SARS-CoV-2 RNA is generally detectable in upper respiratoy specimens during the acute phase of infection. The lowest concentration of SARS-CoV-2 viral copies this assay can detect is 131 copies/mL. A negative result does not preclude SARS-Cov-2 infection and should not be used as the sole basis for treatment or other patient management decisions. A negative result may occur with  improper specimen collection/handling, submission of specimen other than nasopharyngeal swab, presence of viral mutation(s) within the areas targeted by this assay, and inadequate number of viral copies (<131 copies/mL). A negative result must be combined with clinical observations, patient history, and epidemiological information. The expected result is Negative.  Fact Sheet for Patients:  PinkCheek.be  Fact Sheet for Healthcare Providers:  GravelBags.it  This test is no t yet approved or cleared by the [place] FDA and  has been authorized for detection and/or diagnosis of SARS-CoV-2 by FDA under an Emergency Use Authorization (EUA). This EUA will remain  in effect (meaning this test can be used) for the duration of the COVID-19 declaration under Section 564(b)(1) of the Act, 21 U.S.C. section 360bbb-3(b)(1), unless the authorization is terminated or revoked sooner.     Influenza A by PCR NEGATIVE NEGATIVE Final   Influenza B by PCR NEGATIVE NEGATIVE Final    Comment: (NOTE) The Xpert Xpress SARS-CoV-2/FLU/RSV assay is intended as an aid in  the diagnosis of influenza from Nasopharyngeal swab specimens and  should not be used as a sole basis for treatment. Nasal washings and  aspirates are unacceptable for Xpert Xpress SARS-CoV-2/FLU/RSV  testing.  Fact Sheet for  Patients: PinkCheek.be  Fact Sheet for Healthcare Providers: GravelBags.it  This test is not yet approved or cleared by the [place] FDA and  has been authorized for detection and/or diagnosis of SARS-CoV-2 by  FDA under an Emergency Use Authorization (EUA). This EUA will remain  in effect (meaning this test can be used) for the duration of the  Covid-19 declaration under Section 564(b)(1) of the Act, 21  U.S.C. section 360bbb-3(b)(1), unless the authorization is  terminated or revoked. Performed at [facility], [address] [place], [place] [address]          Radiology Studies: CT THORACIC SPINE WO CONTRAST  Result Date: 10/24/2020 CLINICAL DATA:  Mid back pain EXAM: CT THORACIC SPINE WITHOUT CONTRAST TECHNIQUE: Multidetector CT images of the thoracic were obtained using the standard protocol without intravenous contrast. COMPARISON:  CT chest 01/22/2020 FINDINGS: Alignment: Normal Vertebrae: Negative for fracture or mass. No evidence of spinal infection. Paraspinal and other soft tissues: Negative for paraspinous No fluid mass collection or edema. Small bilateral pleural effusions. Diffuse mild bilateral airspace disease. Disc levels: No significant disc space narrowing. No disc degeneration or spurring identified. Negative for spinal stenosis. IMPRESSION: Negative CT thoracic spine Bilateral airspace disease and bilateral small pleural effusions which may represent congestive heart failure based on prior chest x-ray. Electronically Signed   By: Franchot Gallo M.D.   On: 10/24/2020 15:14   CT LUMBAR SPINE WO CONTRAST  Result Date: 10/24/2020 CLINICAL DATA:  Low back pain EXAM: CT LUMBAR SPINE WITHOUT CONTRAST TECHNIQUE: Multidetector CT imaging of the lumbar spine was performed without intravenous contrast administration. Multiplanar CT image reconstructions were also generated. COMPARISON:  CT abdomen pelvis  09/12/2020 FINDINGS: Segmentation: Normal Alignment: Normal Vertebrae: Negative for fracture or mass. No evidence of spinal infection. Paraspinal and other soft tissues: Atherosclerotic abdominal aorta without aneurysm. Lobular contours to the left renal cortex unchanged. Mild left hydronephrosis unchanged. No obstructing calculus. No paraspinous soft tissue edema or fluid collection. Disc levels: L1-2: Negative L2-3: Mild disc and facet degeneration. Negative for disc protrusion or stenosis. L3-4: Mild disc and mild facet degeneration. Negative for disc protrusion or stenosis L4-5: Mild disc bulging and mild facet degeneration. Negative for disc protrusion or stenosis. L5-S1: Mild disc degeneration. Negative for disc  protrusion or stenosis. IMPRESSION: Mild lumbar degenerative change.  No cause for acute lumbar pain. Electronically Signed   By: Franchot Gallo M.D.   On: 10/24/2020 15:23        Scheduled Meds: . amLODipine  10 mg Oral Daily  . calcium carbonate  1,250 mg Oral Q breakfast  . docusate sodium  200 mg Oral BID  . heparin  5,000 Units Subcutaneous Q8H  . influenza vac split quadrivalent PF  0.5 mL Intramuscular Tomorrow-1000  . insulin aspart  0-5 Units Subcutaneous QHS  . insulin aspart  0-9 Units Subcutaneous TID WC  . mometasone-formoterol  2 puff Inhalation BID  . predniSONE  20 mg Oral Q breakfast  . sodium bicarbonate  650 mg Oral BID  . sodium chloride flush  3 mL Intravenous Q12H  . torsemide  20 mg Oral Daily   Continuous Infusions:   LOS: 5 days    Time spent: 31 mins    Wyvonnia Dusky, MD [facility] Pager [phone]  If 7PM-7AM, please contact night-coverage [email or web address] 10/26/2020, 7:10 AM

## 2020-10-27 LAB — BASIC METABOLIC PANEL
Anion gap: 11 (ref 5–15)
BUN: 72 mg/dL — ABNORMAL HIGH (ref 6–20)
CO2: 23 mmol/L (ref 22–32)
Calcium: 8.7 mg/dL — ABNORMAL LOW (ref 8.9–10.3)
Chloride: 98 mmol/L (ref 98–111)
Creatinine, Ser: 3.81 mg/dL — ABNORMAL HIGH (ref 0.44–1.00)
GFR, Estimated: 13 mL/min — ABNORMAL LOW (ref >60)
Glucose, Bld: 346 mg/dL — ABNORMAL HIGH (ref 70–99)
Potassium: 3.5 mmol/L (ref 3.5–5.1)
Sodium: 132 mmol/L — ABNORMAL LOW (ref 135–145)

## 2020-10-27 LAB — GLUCOSE, CAPILLARY
Glucose-Capillary: 319 mg/dL — ABNORMAL HIGH (ref 70–99)
Glucose-Capillary: 408 mg/dL — ABNORMAL HIGH (ref 70–99)
Glucose-Capillary: 429 mg/dL — ABNORMAL HIGH (ref 70–99)
Glucose-Capillary: 159 mg/dL — ABNORMAL HIGH (ref 70–99)
Glucose-Capillary: 173 mg/dL — ABNORMAL HIGH (ref 70–99)

## 2020-10-27 LAB — CBC
HCT: 29.1 % — ABNORMAL LOW (ref 36.0–46.0)
Hemoglobin: 9.9 g/dL — ABNORMAL LOW (ref 12.0–15.0)
MCH: 29.6 pg (ref 26.0–34.0)
MCHC: 34 g/dL (ref 30.0–36.0)
MCV: 86.9 fL (ref 80.0–100.0)
Platelets: 511 10*3/uL — ABNORMAL HIGH (ref 150–400)
RBC: 3.35 MIL/uL — ABNORMAL LOW (ref 3.87–5.11)
RDW: 13.8 % (ref 11.5–15.5)
WBC: 13.7 10*3/uL — ABNORMAL HIGH (ref 4.0–10.5)
nRBC: 0 % (ref 0.0–0.2)

## 2020-10-27 MED ORDER — INSULIN ASPART PROT & ASPART (70-30 MIX) 100 UNIT/ML ~~LOC~~ SUSP
10.0000 [IU] | Freq: Two times a day (BID) | SUBCUTANEOUS | Status: DC
  Administered 2020-10-27 – 2020-10-28 (×4): 10 [IU] via SUBCUTANEOUS
  Filled 2020-10-27 (×4): qty 10

## 2020-10-27 MED ORDER — INSULIN ASPART 100 UNIT/ML ~~LOC~~ SOLN
0.0000 [IU] | Freq: Three times a day (TID) | SUBCUTANEOUS | Status: DC
  Administered 2020-10-27: 3 [IU] via SUBCUTANEOUS
  Administered 2020-10-28: 5 [IU] via SUBCUTANEOUS
  Administered 2020-10-28: 8 [IU] via SUBCUTANEOUS
  Administered 2020-10-28: 11 [IU] via SUBCUTANEOUS
  Filled 2020-10-27 (×4): qty 1

## 2020-10-27 NOTE — Progress Notes (Signed)
[facility]  ROUNDING NOTE   Subjective:   Patient resting in bed, reports feeling tired, and sore on back, from lying in bed.    Objective:  Vital signs in last 24 hours:  Temp:  [97.6 F (36.4 C)-98.3 F (36.8 C)] 97.9 F (36.6 C) (11/08 1100) Pulse Rate:  [71-81] 71 (11/08 1100) Resp:  [17-19] 19 (11/08 0849) BP: (164-181)/(85-94) 177/85 (11/08 1100) SpO2:  [99 %-100 %] 99 % (11/08 1100) Weight:  [87.1 kg] 87.1 kg (11/08 0357)  Weight change: -0.354 kg Filed Weights   10/25/20 0548 10/26/20 0436 10/27/20 0357  Weight: 85.6 kg 87.5 kg 87.1 kg    Intake/Output: I/O last 3 completed shifts: In: 1198 [P.O.:1198] Out: 1450 [Urine:1450]   Intake/Output this shift:  Total I/O In: 240 [P.O.:240] Out: 900 [Urine:900]  Physical Exam: General: In no acute distress  Head: Normocephalic, atraumatic  Eyes: Sclerae and conjunctivae clear  Lungs:  Lungs with fine crackles  Heart: S1S2,no rubs or gallops  Abdomen:  Soft, nontender, non distended  Extremities:  1+peripheral edema.  Neurologic: Oriented X 3  Skin: No acute lesions or rashes    Basic Metabolic Panel: Recent Labs  Lab 10/22/20 0500 10/22/20 0500 10/23/20 0350 10/23/20 0350 10/24/20 0342 10/24/20 0342 10/25/20 0343 10/26/20 0645 10/27/20 0452  NA 143   < > 141  --  137  --  138 132* 132*  K 3.3*   < > 3.2*  --  3.1*  --  3.6 3.7 3.5  CL 111   < > 110  --  103  --  103 99 98  CO2 21*   < > 23  --  22  --  23 21* 23  GLUCOSE 261*   < > 196*  --  213*  --  363* 372* 346*  BUN 43*   < > 46*  --  47*  --  51* 63* 72*  CREATININE 3.37*   < > 3.69*  --  3.92*  --  4.01* 3.95* 3.81*  CALCIUM 8.3*   < > 7.9*   < > 8.2*   < > 8.3* 8.8* 8.7*  MG 1.8  --   --   --   --   --   --   --   --    < > = values in this interval not displayed.    Liver Function Tests: Recent Labs  Lab 10/21/20 1931  AST 17  ALT 8  ALKPHOS 70  BILITOT 1.1  PROT 6.7  ALBUMIN 2.4*   No results for input(s):  LIPASE, AMYLASE in the last 168 hours. No results for input(s): AMMONIA in the last 168 hours.  CBC: Recent Labs  Lab 10/21/20 1931 10/22/20 0500 10/23/20 0350 10/24/20 0342 10/25/20 0343 10/26/20 0645 10/27/20 0452  WBC 12.2*   < > 10.0 12.8* 20.2* 18.9* 13.7*  NEUTROABS 9.3*  --   --   --   --   --   --   HGB 10.0*   < > 8.5* 8.9* 9.4* 10.1* 9.9*  HCT 31.1*   < > 25.7* 27.8* 28.6* 30.4* 29.1*  MCV 92.0   < > 89.2 91.1 89.7 88.9 86.9  PLT 376   < > 374 438* 447* 466* 511*   < > = values in this interval not displayed.    Cardiac Enzymes: No results for input(s): CKTOTAL, CKMB, CKMBINDEX, TROPONINI in the last 168 hours.  BNP: Invalid input(s): POCBNP  CBG: Recent Labs  Lab 10/26/20 1153 10/26/20 1649 10/26/20 1941 10/27/20 0937 10/27/20 1230  GLUCAP 332* 325* 349* 408* 429*    Microbiology: Results for orders placed or performed during the hospital encounter of 10/21/20  Respiratory Panel by RT PCR (Flu A&B, Covid) - Nasopharyngeal Swab     Status: None   Collection Time: 10/21/20 11:51 PM   Specimen: Nasopharyngeal Swab  Result Value Ref Range Status   SARS Coronavirus 2 by RT PCR NEGATIVE NEGATIVE Final    Comment: (NOTE) SARS-CoV-2 target nucleic acids are NOT DETECTED.  The SARS-CoV-2 RNA is generally detectable in upper respiratoy specimens during the acute phase of infection. The lowest concentration of SARS-CoV-2 viral copies this assay can detect is 131 copies/mL. A negative result does not preclude SARS-Cov-2 infection and should not be used as the sole basis for treatment or other patient management decisions. A negative result may occur with  improper specimen collection/handling, submission of specimen other than nasopharyngeal swab, presence of viral mutation(s) within the areas targeted by this assay, and inadequate number of viral copies (<131 copies/mL). A negative result must be combined with clinical observations, patient history, and  epidemiological information. The expected result is Negative.  Fact Sheet for Patients:  PinkCheek.be  Fact Sheet for Healthcare Providers:  GravelBags.it  This test is no t yet approved or cleared by the [place] FDA and  has been authorized for detection and/or diagnosis of SARS-CoV-2 by FDA under an Emergency Use Authorization (EUA). This EUA will remain  in effect (meaning this test can be used) for the duration of the COVID-19 declaration under Section 564(b)(1) of the Act, 21 U.S.C. section 360bbb-3(b)(1), unless the authorization is terminated or revoked sooner.     Influenza A by PCR NEGATIVE NEGATIVE Final   Influenza B by PCR NEGATIVE NEGATIVE Final    Comment: (NOTE) The Xpert Xpress SARS-CoV-2/FLU/RSV assay is intended as an aid in  the diagnosis of influenza from Nasopharyngeal swab specimens and  should not be used as a sole basis for treatment. Nasal washings and  aspirates are unacceptable for Xpert Xpress SARS-CoV-2/FLU/RSV  testing.  Fact Sheet for Patients: PinkCheek.be  Fact Sheet for Healthcare Providers: GravelBags.it  This test is not yet approved or cleared by the [place] FDA and  has been authorized for detection and/or diagnosis of SARS-CoV-2 by  FDA under an Emergency Use Authorization (EUA). This EUA will remain  in effect (meaning this test can be used) for the duration of the  Covid-19 declaration under Section 564(b)(1) of the Act, 21  U.S.C. section 360bbb-3(b)(1), unless the authorization is  terminated or revoked. Performed at [facility], [address] [place], [place] [address]     Coagulation Studies: No results for input(s): LABPROT, INR in the last 72 hours.  Urinalysis: No results for input(s): COLORURINE, LABSPEC, PHURINE, GLUCOSEU, HGBUR, BILIRUBINUR, KETONESUR, PROTEINUR, UROBILINOGEN,  NITRITE, LEUKOCYTESUR in the last 72 hours.  Invalid input(s): APPERANCEUR    Imaging: No results found.   Medications:    . amLODipine  10 mg Oral Daily  . calcium carbonate  1,250 mg Oral Q breakfast  . docusate sodium  200 mg Oral BID  . heparin  5,000 Units Subcutaneous Q8H  . influenza vac split quadrivalent PF  0.5 mL Intramuscular Tomorrow-1000  . insulin aspart  0-15 Units Subcutaneous TID WC  . insulin aspart  0-5 Units Subcutaneous QHS  . insulin aspart protamine- aspart  10 Units Subcutaneous BID WC  . mometasone-formoterol  2 puff  Inhalation BID  . pantoprazole  40 mg Oral Daily  . prednisoLONE acetate  1 drop Right Eye QID  . predniSONE  20 mg Oral Q breakfast  . sodium bicarbonate  650 mg Oral BID  . sodium chloride flush  3 mL Intravenous Q12H  . torsemide  20 mg Oral Daily   acetaminophen **OR** acetaminophen, bisacodyl, cyclobenzaprine, ipratropium-albuterol, labetalol, meclizine, morphine injection, ondansetron **OR** ondansetron (ZOFRAN) IV, oxyCODONE  Assessment/ Plan:  [name] is a 59 y.o.  female with medical problems of coronary artery disease status post CABG in 2018 Stroke 2014 CKD Asthma Hypertension Hyperlipidemia Diabetes type 2, poorly controlled Pituitary tumor status post removal in 1992 Blindness in left eye Right perinephric hematoma status post kidney biopsy September 2021 Chronic mild hydronephrosis bilaterally  presented to the ED for worsening SOB, and dyspnea on exertion, has h/o CHF with EF 30 -35%,CKD Stage IV worsening to V, Diabetes and hypertension. She was admitted to the hospital recently for COPD exacerbation and pneumonia.  Renal biopsy from September 11, 2020 shows acute tubular injury with mild acute interstitial nephritis suggestive of a hypersensitivity etiology.  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  No evidence  of immune complex mediated or active GN.  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   # Acute Kidney Injury on CKD Stage IV  Baseline Creatinine 3.14 on 09/15/2020 with GFR 18  Lab Results  Component Value Date   CREATININE 3.81 (H) 10/27/2020   CREATININE 3.95 (H) 10/26/2020   CREATININE 4.01 (H) 10/25/2020  Creatinine stays high, slightly improved Urine Output 1450 ml for the preceding 24 hours   #Diabetes type 2 with CKD Lab Results  Component Value Date   HGBA1C 8.6 (H) 10/11/2020    Blood glucose readings stays high, despite Insulin Aspart scheduled and sliding scale doses  # Acute on chronic systolic CHF exacerbation: #Lower extremity edema EF 30-35% from TTE on 02/27/2020 Continue Torsemide 20 mg PO daily  #Hyponatremia Sodium 132 today Will continue monitoring closely    LOS: 6 Terry Arroyo 11/8/20212:24 PM

## 2020-10-27 NOTE — Progress Notes (Signed)
PROGRESS NOTE    Terry Arroyo  MWU:[id number] DOB: September 28, 1961 DOA: 10/21/2020 PCP: Kirk Ruths, MD    Assessment & Plan:   Principal Problem:   Acute on chronic systolic congestive heart failure ([place]) Active Problems:   DM2 (diabetes mellitus, type 2) (HCC)   CKD (chronic kidney disease) stage 4, GFR 15-29 ml/min (HCC)   Hypertension associated with diabetes ([place])   CAD (coronary artery disease)   Hyperlipidemia associated with type 2 diabetes mellitus ([place])   OSA (obstructive sleep apnea)  AKI on CKD IV: w/ hx of nephrotic syndrome. Cr is trending down again today.  Continue on prednisone for acute interstitial nephritis as per nephro. Continue on torsemide as per nephro. Hold lasix, coreg, imdur, aspirin and any other nephrotoxic meds as pt's kidney biopsy showed hypersensitivity reaction. Nephro following and recs apprec  Acute on chronic systolic CHF exacerbation: echo showed EF 30-35% 02/27/2020. Continue on torsemide. Strict I/Os & daily weights. Initially required BiPAP and now currently on 2L [place]. Hold lasix secondary to AKI on CKD. Not on ACE/ARB/spironolactone due to renal dysfunction  Thrombocytosis: etiology unclear. Will continue to monitor    CAD: s/p CABG. Continue to hold home dose of aspirin, imdur, statin, coreg as pt's kidney biopsy showed hypersensitivity   COPD: w/o exacerbation. Encourage incentive spirometry and continue on bronchodilators  Likely ACD: likely secondary to CKD. No need for a transfusion at this time.    Hypokalemia: WNL today. Will continue   DM2: poorly controlled, HbA1c 8.6. Continue on SSI w/ accuchecks.   HTN: continue on amlodipine   HLD: continue to hold statin   OSA: does not use CPAP at home   Chronic back pain: CT L spine shows mild degenerative change. No cause of acute lumbar pain. Morphine, oxycodone prn  Constipation: continue on colace, dulcolax   DVT prophylaxis: heparin   Code Status: full  Family  Communication:  Disposition Plan: likely d/c back home. PT/OT cosnulted   Status is: Inpatient  Remains inpatient appropriate because:Ongoing diagnostic testing needed not appropriate for outpatient work up and IV treatments appropriate due to intensity of illness or inability to take PO, may need HD but nephro will decide     Dispo: The patient is from: Home              Anticipated d/c is to: Home              Anticipated d/c date is: 3 days              Patient currently is not medically stable to d/c.     Consultants:   nephro    Procedures:   Antimicrobials:   Subjective: Pt c/o back pain   Objective: Vitals:   10/26/20 1942 10/27/20 0335 10/27/20 0355 10/27/20 0357  BP: (!) 164/89 (!) 181/93 (!) 177/94   Pulse: 81 76    Resp: 18 17 18    Temp: 98.2 F (36.8 C) 97.6 F (36.4 C) 97.7 F (36.5 C)   TempSrc: Oral Oral Oral   SpO2: 100% 100% 100%   Weight:    87.1 kg    Intake/Output Summary (Last 24 hours) at 10/27/2020 0725 Last data filed at 10/27/2020 0520 Gross per 24 hour  Intake 958 ml  Output 1450 ml  Net -492 ml   Filed Weights   10/25/20 0548 10/26/20 0436 10/27/20 0357  Weight: 85.6 kg 87.5 kg 87.1 kg    Examination:  General exam: Appears calm & comfortable  Respiratory system: decreased breath sounds  Cardiovascular system: S1/S2+. No rubs or gallops Gastrointestinal system: Abdomen is nondistended, soft and nontender. Hypoactive bowel sounds  Central nervous system: Appears alert & oriented. Moves all 4 extremities  Psychiatry: Judgement and insight appear normal. Flat mood and affect     Data Reviewed: I have personally reviewed following labs and imaging studies  CBC: Recent Labs  Lab 10/21/20 1931 10/22/20 0500 10/23/20 0350 10/24/20 0342 10/25/20 0343 10/26/20 0645 10/27/20 0452  WBC 12.2*   < > 10.0 12.8* 20.2* 18.9* 13.7*  NEUTROABS 9.3*  --   --   --   --   --   --   HGB 10.0*   < > 8.5* 8.9* 9.4* 10.1* 9.9*  HCT  31.1*   < > 25.7* 27.8* 28.6* 30.4* 29.1*  MCV 92.0   < > 89.2 91.1 89.7 88.9 86.9  PLT 376   < > 374 438* 447* 466* 511*   < > = values in this interval not displayed.   Basic Metabolic Panel: Recent Labs  Lab 10/22/20 0500 10/22/20 0500 10/23/20 0350 10/24/20 0342 10/25/20 0343 10/26/20 0645 10/27/20 0452  NA 143   < > 141 137 138 132* 132*  K 3.3*   < > 3.2* 3.1* 3.6 3.7 3.5  CL 111   < > 110 103 103 99 98  CO2 21*   < > 23 22 23  21* 23  GLUCOSE 261*   < > 196* 213* 363* 372* 346*  BUN 43*   < > 46* 47* 51* 63* 72*  CREATININE 3.37*   < > 3.69* 3.92* 4.01* 3.95* 3.81*  CALCIUM 8.3*   < > 7.9* 8.2* 8.3* 8.8* 8.7*  MG 1.8  --   --   --   --   --   --    < > = values in this interval not displayed.   GFR: Estimated Creatinine Clearance: 17.9 mL/min (A) (by C-G formula based on SCr of 3.81 mg/dL (H)). Liver Function Tests: Recent Labs  Lab 10/21/20 1931  AST 17  ALT 8  ALKPHOS 70  BILITOT 1.1  PROT 6.7  ALBUMIN 2.4*   No results for input(s): LIPASE, AMYLASE in the last 168 hours. No results for input(s): AMMONIA in the last 168 hours. Coagulation Profile: No results for input(s): INR, PROTIME in the last 168 hours. Cardiac Enzymes: No results for input(s): CKTOTAL, CKMB, CKMBINDEX, TROPONINI in the last 168 hours. BNP (last 3 results) No results for input(s): PROBNP in the last 8760 hours. HbA1C: No results for input(s): HGBA1C in the last 72 hours. CBG: Recent Labs  Lab 10/25/20 1959 10/26/20 0755 10/26/20 1153 10/26/20 1649 10/26/20 1941  GLUCAP 269* 373* 332* 325* 349*   Lipid Profile: No results for input(s): CHOL, HDL, LDLCALC, TRIG, CHOLHDL, LDLDIRECT in the last 72 hours. Thyroid Function Tests: No results for input(s): TSH, T4TOTAL, FREET4, T3FREE, THYROIDAB in the last 72 hours. Anemia Panel: No results for input(s): VITAMINB12, FOLATE, FERRITIN, TIBC, IRON, RETICCTPCT in the last 72 hours. Sepsis Labs: Recent Labs  Lab 10/21/20 1931    LATICACIDVEN 1.1    Recent Results (from the past 240 hour(s))  Respiratory Panel by RT PCR (Flu A&B, Covid) - Nasopharyngeal Swab     Status: None   Collection Time: 10/21/20 11:51 PM   Specimen: Nasopharyngeal Swab  Result Value Ref Range Status   SARS Coronavirus 2 by RT PCR NEGATIVE NEGATIVE Final    Comment: (NOTE) SARS-CoV-2 target  nucleic acids are NOT DETECTED.  The SARS-CoV-2 RNA is generally detectable in upper respiratoy specimens during the acute phase of infection. The lowest concentration of SARS-CoV-2 viral copies this assay can detect is 131 copies/mL. A negative result does not preclude SARS-Cov-2 infection and should not be used as the sole basis for treatment or other patient management decisions. A negative result may occur with  improper specimen collection/handling, submission of specimen other than nasopharyngeal swab, presence of viral mutation(s) within the areas targeted by this assay, and inadequate number of viral copies (<131 copies/mL). A negative result must be combined with clinical observations, patient history, and epidemiological information. The expected result is Negative.  Fact Sheet for Patients:  PinkCheek.be  Fact Sheet for Healthcare Providers:  GravelBags.it  This test is no t yet approved or cleared by the [place] FDA and  has been authorized for detection and/or diagnosis of SARS-CoV-2 by FDA under an Emergency Use Authorization (EUA). This EUA will remain  in effect (meaning this test can be used) for the duration of the COVID-19 declaration under Section 564(b)(1) of the Act, 21 U.S.C. section 360bbb-3(b)(1), unless the authorization is terminated or revoked sooner.     Influenza A by PCR NEGATIVE NEGATIVE Final   Influenza B by PCR NEGATIVE NEGATIVE Final    Comment: (NOTE) The Xpert Xpress SARS-CoV-2/FLU/RSV assay is intended as an aid in  the diagnosis of  influenza from Nasopharyngeal swab specimens and  should not be used as a sole basis for treatment. Nasal washings and  aspirates are unacceptable for Xpert Xpress SARS-CoV-2/FLU/RSV  testing.  Fact Sheet for Patients: PinkCheek.be  Fact Sheet for Healthcare Providers: GravelBags.it  This test is not yet approved or cleared by the [place] FDA and  has been authorized for detection and/or diagnosis of SARS-CoV-2 by  FDA under an Emergency Use Authorization (EUA). This EUA will remain  in effect (meaning this test can be used) for the duration of the  Covid-19 declaration under Section 564(b)(1) of the Act, 21  U.S.C. section 360bbb-3(b)(1), unless the authorization is  terminated or revoked. Performed at [facility], [address], [place], [place] [address]          Radiology Studies: No results found.      Scheduled Meds:  amLODipine  10 mg Oral Daily   calcium carbonate  1,250 mg Oral Q breakfast   docusate sodium  200 mg Oral BID   heparin  5,000 Units Subcutaneous Q8H   influenza vac split quadrivalent PF  0.5 mL Intramuscular Tomorrow-1000   insulin aspart  0-5 Units Subcutaneous QHS   insulin aspart  0-9 Units Subcutaneous TID WC   mometasone-formoterol  2 puff Inhalation BID   pantoprazole  40 mg Oral Daily   prednisoLONE acetate  1 drop Right Eye QID   predniSONE  20 mg Oral Q breakfast   sodium bicarbonate  650 mg Oral BID   sodium chloride flush  3 mL Intravenous Q12H   torsemide  20 mg Oral Daily   Continuous Infusions:   LOS: 6 days    Time spent: 32 mins    Wyvonnia Dusky, MD [facility] Pager [phone]  If 7PM-7AM, please contact night-coverage [email or web address] 10/27/2020, 7:25 AM

## 2020-10-27 NOTE — Progress Notes (Signed)
PT Cancellation Note  Patient Details Name: Terry Arroyo MRN: [id number] DOB: 11-09-1961   Cancelled Treatment:    Reason Eval/Treat Not Completed: Medical issues which prohibited therapy (Per chart review, BG uptrending since breakast, in 400s twice today. Last BP also elevated in 170s SBP. WIll hold PT evaluation until medically appropriate.)  3:39 PM, 10/27/20 Etta Grandchild, PT, DPT Physical Therapist - [facility]  [phone] ([place])   Dondi Aime C 10/27/2020, 3:39 PM

## 2020-10-27 NOTE — TOC Progression Note (Signed)
Transition of Care [facility]) - Progression Note    Patient Details  Name: Terry Arroyo MRN: [id number] Date of Birth: 1961/04/07  [facility]) CM/SW Contact  Eileen Stanford, LCSW Phone Number: 10/27/2020, 3:14 PM  Clinical Narrative:   Pt HH has been set up for when pt returns to [facility] Pt not currently medically stable. CSW will continue to follow.    Expected Discharge Plan: [place] Barriers to Discharge: Continued Medical Work up  Expected Discharge Plan and Services Expected Discharge Plan: [place] In-house Referral: Clinical Social Work   Post Acute Care Choice: [place] arrangements for the past 2 months: [place]                 DME Arranged: Shower stool DME Agency: [employer] Date DME Agency Contacted: 10/23/20 Time DME Agency Contacted: [id number] Representative spoke with at DME Agency: [place]: [place] Determinants of Health ([place]) Interventions    Readmission Risk Interventions Readmission Risk Prevention Plan 10/23/2020 10/12/2020  Transportation Screening Complete Complete  Medication Review Press photographer) Complete Complete  PCP or Specialist appointment within 3-5 days of discharge Complete Complete  HRI or Home Care Consult Complete -  SW Recovery Care/Counseling Consult Complete Complete  Palliative Care Screening Not Applicable Not [place] Not Applicable Not Complete  SNF Comments - Patient pending evaluation  Some recent data might be hidden

## 2020-10-27 NOTE — Progress Notes (Signed)
Mobility Specialist - Progress Note   10/27/20 1400  Mobility  Activity Contraindicated/medical hold  Mobility performed by Mobility specialist    Per chart review, pt's glucose levels are currently 429, sitting outside safety guidelines for mobility. Will hold off this date and attempt session when pt is medically appropriate to be seen.    Kathee Delton Mobility Specialist 10/27/20, 2:43 PM

## 2020-10-27 NOTE — Evaluation (Signed)
Occupational Therapy Evaluation Patient Details Name: Terry Arroyo MRN: [id number] DOB: Aug 11, 1961 Today's Date: 10/27/2020    History of Present Illness Terry. Terry Arroyo is a 59 y.o.  female with medical problems of coronary artery disease status post CABG in 2018 Stroke 2014, CKD, Asthma, Hypertension, Hyperlipidemia, Diabetes type 2, Pituitary tumor status post removal in 1992, and Blindness in left eye. Presented to the ED for worsening SOB, and dyspnea on exertion, has h/o CHF with EF 30 -35%,CKD Stage IV worsening to V, Diabetes and hypertension.   Clinical Impression   Terry Arroyo was seen for OT evaluation this date, RN cleared pt for in room mobility. Prior to hospital admission, pt was MOD I for ADLs and mobility using SPC. Pt lives alone in 2nd floor apartment c accessible bathroom and elevator entry. Pt presents to acute OT demonstrating impaired ADL performance and functional mobility 2/2 decreased activity tolerance. Upon arrival pt self-initiating BSC t/f - currently requires SUPERVISION for BSC t/f and perihygiene. MOD I doff B socks seated EOB. Return to sitting vitals taken: BP 166/92, MAP 114, HR 72. Pt would benefit from skilled OT to address noted impairments and functional limitations (see below for any additional details) in order to maximize safety and independence while minimizing falls risk and caregiver burden. Upon hospital discharge, recommend HHOT to maximize pt safety and return to functional independence during meaningful occupations of daily life.     Follow Up Recommendations  Home health OT    Equipment Recommendations  Tub/shower seat    Recommendations for Other Services       Precautions / Restrictions Precautions Precautions: None Restrictions Weight Bearing Restrictions: No      Mobility Bed Mobility Overal bed mobility: Independent     Transfers Overall transfer level: Independent        General transfer comment: SPT bed<>BSC w/o  AD    Balance Overall balance assessment: Mild deficits observed, not formally tested         ADL either performed or assessed with clinical judgement   ADL Overall ADL's : Needs assistance/impaired    General ADL Comments: SUPERVISION for BSC t/f including perihygiene. MOD I doff B socks seated EOB.                   Pertinent Vitals/Pain Pain Assessment: No/denies pain     Hand Dominance Left   Extremity/Trunk Assessment Upper Extremity Assessment Upper Extremity Assessment: Overall WFL for tasks assessed (L weakness residual from old stroke - WFL)   Lower Extremity Assessment Lower Extremity Assessment: Generalized weakness       Communication Communication Communication: No difficulties   Cognition Arousal/Alertness: Awake/alert Behavior During Therapy: WFL for tasks assessed/performed Overall Cognitive Status: Within Functional Limits for tasks assessed        General Comments  Seated: BP 166/92, MAP 114, HR 72    Exercises Exercises: Other exercises Other Exercises Other Exercises: Pt educated re: OT role, DME recs, d/c recs, falls prevention, ECS Other Exercises: LBD, toileting, sup<>sit, sit<>stand, SPT, sitting/standing balance/tolerance   Shoulder Instructions      Home Living Family/patient expects to be discharged to:: Private residence Living Arrangements: Alone Available Help at Discharge: Family;Available PRN/intermittently Type of Home: Apartment Home Access: Level entry;Elevator     Home Layout: One level     Bathroom Shower/Tub: Teacher, early years/pre: Standard     Home Equipment: Bedside commode;Walker - 2 wheels;Cane - single point;Shower seat;Grab bars - tub/shower;Grab bars -  toilet          Prior Functioning/Environment Level of Independence: Independent with assistive device(s)        Comments: Ambulatory with SPC. Pt does not drive, has meals delivered. Nephew checks in weekls         OT Problem  List: Decreased activity tolerance;Decreased safety awareness      OT Treatment/Interventions: Self-care/ADL training;Therapeutic exercise;DME and/or AE instruction;Energy conservation;Therapeutic activities;Patient/family education;Balance training    OT Goals(Current goals can be found in the care plan section) Acute Rehab OT Goals Patient Stated Goal: to return home OT Goal Formulation: With patient Time For Goal Achievement: 11/10/20 Potential to Achieve Goals: Good ADL Goals Pt Will Perform Grooming: Independently;standing Pt Will Transfer to Toilet: ambulating;regular height toilet;Independently Additional ADL Goal #1: Pt will Independently verbalize plan to implement x3 ECS  OT Frequency: Min 1X/week    AM-PAC OT "6 Clicks" Daily Activity     Outcome Measure Help from another person eating meals?: None Help from another person taking care of personal grooming?: A Little Help from another person toileting, which includes using toliet, bedpan, or urinal?: None Help from another person bathing (including washing, rinsing, drying)?: A Little Help from another person to put on and taking off regular upper body clothing?: None Help from another person to put on and taking off regular lower body clothing?: None 6 Click Score: 22   End of Session Nurse Communication: Other (comment) (920mL urine output )  Activity Tolerance: Patient tolerated treatment well Patient left: in bed;with call bell/phone within reach  OT Visit Diagnosis: Other abnormalities of gait and mobility (R26.89)                Time: 4496-7591 OT Time Calculation (min): 14 min Charges:  OT General Charges $OT Visit: 1 Visit OT Evaluation $OT Eval Low Complexity: 1 Low OT Treatments $Self Care/Home Management : 8-22 mins  [name], M.S. OTR/L  10/27/20, 4:40 PM  ascom [phone]

## 2020-10-27 NOTE — Progress Notes (Signed)
Inpatient Diabetes Program Recommendations  AACE/ADA: New Consensus Statement on Inpatient Glycemic Control (2015)  Target Ranges:  Prepandial:   less than 140 mg/dL      Peak postprandial:   less than 180 mg/dL (1-2 hours)      Critically ill patients:  140 - 180 mg/dL   Lab Results  Component Value Date   GLUCAP 349 (H) 10/26/2020   HGBA1C 8.6 (H) 10/11/2020    Review of Glycemic Control Results for BIRDENA, KINGMA (MRN [id number]) as of 10/27/2020 08:58  Ref. Range 10/26/2020 07:55 10/26/2020 11:53 10/26/2020 16:49 10/26/2020 19:41  Glucose-Capillary Latest Ref Range: 70 - 99 mg/dL 373 (H) 332 (H) 325 (H) 349 (H)   Diabetes history: DM2 Outpatient Diabetes medications: Novolog 70/30 45 units bid Current orders for Inpatient glycemic control: Novolog sensitive 0-9 units tid + hs 0-5 units  Inpatient Diabetes Program Recommendations:   Add portion of Novolog 70/30 10 units bid ac meals. Secure chat sent to Dr. Jimmye Norman.  Thank you, Nani Gasser. Mayrin Schmuck, RN, MSN, CDE  Diabetes Coordinator Inpatient Glycemic Control Team Team Pager [phone] (8am-5pm) 10/27/2020 8:59 AM

## 2020-10-27 NOTE — Care Management Important Message (Signed)
Important Message  Patient Details  Name: Terry Arroyo MRN: [id number] Date of Birth: 04-Aug-1961   Medicare Important Message Given:  Yes     Juliann Pulse A Royce Stegman 10/27/2020, 1:29 PM

## 2020-10-28 DIAGNOSIS — N179 Acute kidney failure, unspecified: Secondary | ICD-10-CM

## 2020-10-28 LAB — CBC
HCT: 26.4 % — ABNORMAL LOW (ref 36.0–46.0)
Hemoglobin: 8.6 g/dL — ABNORMAL LOW (ref 12.0–15.0)
MCH: 28.8 pg (ref 26.0–34.0)
MCHC: 32.6 g/dL (ref 30.0–36.0)
MCV: 88.3 fL (ref 80.0–100.0)
Platelets: 486 10*3/uL — ABNORMAL HIGH (ref 150–400)
RBC: 2.99 MIL/uL — ABNORMAL LOW (ref 3.87–5.11)
RDW: 13.8 % (ref 11.5–15.5)
WBC: 10 10*3/uL (ref 4.0–10.5)
nRBC: 0 % (ref 0.0–0.2)

## 2020-10-28 LAB — BASIC METABOLIC PANEL
Anion gap: 9 (ref 5–15)
BUN: 77 mg/dL — ABNORMAL HIGH (ref 6–20)
CO2: 24 mmol/L (ref 22–32)
Calcium: 8 mg/dL — ABNORMAL LOW (ref 8.9–10.3)
Chloride: 99 mmol/L (ref 98–111)
Creatinine, Ser: 3.84 mg/dL — ABNORMAL HIGH (ref 0.44–1.00)
GFR, Estimated: 13 mL/min — ABNORMAL LOW (ref >60)
Glucose, Bld: 310 mg/dL — ABNORMAL HIGH (ref 70–99)
Potassium: 3.4 mmol/L — ABNORMAL LOW (ref 3.5–5.1)
Sodium: 132 mmol/L — ABNORMAL LOW (ref 135–145)

## 2020-10-28 LAB — GLUCOSE, CAPILLARY
Glucose-Capillary: 344 mg/dL — ABNORMAL HIGH (ref 70–99)
Glucose-Capillary: 229 mg/dL — ABNORMAL HIGH (ref 70–99)
Glucose-Capillary: 262 mg/dL — ABNORMAL HIGH (ref 70–99)

## 2020-10-28 MED ORDER — ISOSORBIDE MONONITRATE ER 30 MG PO TB24
30.0000 mg | ORAL_TABLET | Freq: Once | ORAL | Status: AC
  Administered 2020-10-28: 30 mg via ORAL
  Filled 2020-10-28: qty 1

## 2020-10-28 MED ORDER — PREDNISONE 20 MG PO TABS: 20.0000 mg | ORAL_TABLET | Freq: Every day | ORAL | 0 refills | Status: DC

## 2020-10-28 MED ORDER — CARVEDILOL 25 MG PO TABS
25.0000 mg | ORAL_TABLET | Freq: Once | ORAL | Status: AC
  Administered 2020-10-28: 25 mg via ORAL
  Filled 2020-10-28: qty 1

## 2020-10-28 NOTE — Progress Notes (Addendum)
Discussed patient discharge instructions, including medications and follow up appointments.  Encouraged patient to continue to monitor blood sugars and BP at home.

## 2020-10-28 NOTE — Progress Notes (Signed)
Patient BP still elevated-  Per MD may take time for BP to come down.  Patient has been off BP medications since being here.   Ordered Imdur to give.  Still ok to  discharge

## 2020-10-28 NOTE — Progress Notes (Signed)
Inpatient Diabetes Program Recommendations  AACE/ADA: New Consensus Statement on Inpatient Glycemic Control (2015)  Target Ranges:  Prepandial:   less than 140 mg/dL      Peak postprandial:   less than 180 mg/dL (1-2 hours)      Critically ill patients:  140 - 180 mg/dL   Lab Results  Component Value Date   GLUCAP 344 (H) 10/28/2020   HGBA1C 8.6 (H) 10/11/2020    Review of Glycemic Control Results for Terry Arroyo, Terry Arroyo (MRN [id number]) as of 10/28/2020 10:16  Ref. Range 10/27/2020 09:37 10/27/2020 12:30 10/27/2020 16:33 10/27/2020 20:03 10/28/2020 07:50  Glucose-Capillary Latest Ref Range: 70 - 99 mg/dL 408 (H) 429 (H) 159 (H) 173 (H) 344 (H)   Diabetes history: DM2 Outpatient Diabetes medications: Novolog 70/30 45 units bid Current orders for Inpatient glycemic control: 70/30 10 units bid + Novolog sensitive 0-9 units tid + hs 0-5 units  Inpatient Diabetes Program Recommendations:   -Consider increase in Novolog 70/30 to 25 units bid Secure chat sent to Dr. Jimmye Norman.  Thank you, Nani Gasser. Yoan Sallade, RN, MSN, CDE  Diabetes Coordinator Inpatient Glycemic Control Team Team Pager [phone] (8am-5pm) 10/28/2020 10:17 AM

## 2020-10-28 NOTE — Discharge Summary (Signed)
Physician Discharge Summary  [name] DVV:[id number] DOB: 1961-02-10 DOA: 10/21/2020  PCP: Terry Ruths, MD  Admit date: 10/21/2020 Discharge date: 10/28/2020  Admitted From: home Disposition: home w/ home health   Recommendations for Outpatient Follow-up:  1. Follow up with PCP in 1-2 weeks 2. F/u w/ nephro, Terry Arroyo in 1-2 weeks   Home Health: yes  Equipment/Devices:  Discharge Condition: stable  CODE STATUS: full  Diet recommendation: Heart Healthy / Carb Modified / Renal   Brief/Interim Summary: HPI was taken from Terry Arroyo: Terry Arroyo is a 59 y.o. female with medical history significant for chronic systolic CHF (EF 62-69% by TTE 02/27/2020), CAD s/p CABG, CKD stage IV-V, insulin-dependent type 2 diabetes, hypertension, hyperlipidemia, COPD, and OSA who presents to the ED for evaluation of shortness of breath.  Patient recently hospitalized from 10/10/2020-10/12/2020 for acute COPD exacerbation and community-acquired pneumonia.  She initially required BiPAP but was transitioned off.  She was treated with empiric antibiotics and IV steroids.  She did not require supplemental O2 on discharge.  Patient states over the last few days she has been having recurrent shortness of breath, worse with minimal activity.  She has had worsening swelling in both of her lower extremities.  She has had a nonproductive cough.  She denies any subjective fevers, chills, diaphoresis, chest pain, nausea, vomiting, abdominal pain, or dysuria.  Patient does state that she has been drinking up to twelve 12 oz water bottles daily.  She says she has been limiting salt intake.  ED Course:  Initial vitals showed BP 195/87, pulse 70, RR 30, temp 98.4 Fahrenheit, SPO2 99-100% on room air.  Due to difficulty obtaining IV access, patient was placed on BiPAP.  Labs show WBC 12.2, hemoglobin 10.0, platelets 376,000, sodium 142, potassium 3.7, bicarb 18, BUN 44, creatinine 3.37,  serum glucose 222, BNP 1384.4, VBG shows pH 7.30/PCO2 44/PO2 42.0.  High-sensitivity troponin I is 17.  Lactic acid 1.1.  SARS-CoV-2 PCR panel is ordered and pending.  Portable chest x-ray shows cardiomegaly with pulmonary edema, bilateral pleural effusions, and prior sternotomy/CABG changes.  Patient was given topical nitroglycerin ointment x2.  IV access was obtained and patient was given IV Lasix 20 mg.  The hospitalist service was consulted for further evaluation and management.  Hospital Course from Terry Arroyo 11/3-11/9/21: Pt presented w/ shortness of breath and was found to have acute on chronic CHF exacerbation. Pt was initially treated w/ IV lasix then torsemide as per nephro. Of note, pt was found to have AKI on CKD IV and all of pt's anti-HTN meds were held as pt's kidney biopsy showed hypersensitivity reaction. Pt's BP meds were restarted at d/c. Also, pt was put on prednisone for acute interstitial nephritis as per nephro. Pt will f/u outpatient w/ Terry Arroyo in 1-2 weeks. For more information, please see other progress/consult notes for more information.   Discharge Diagnoses:  Principal Problem:   Acute on chronic systolic congestive heart failure (HCC) Active Problems:   DM2 (diabetes mellitus, type 2) (HCC)   CKD (chronic kidney disease) stage 4, GFR 15-29 ml/min (HCC)   Hypertension associated with diabetes ([place])   CAD (coronary artery disease)   Hyperlipidemia associated with type 2 diabetes mellitus (HCC)   OSA (obstructive sleep apnea) AKI on CKD IV: w/ hx of nephrotic syndrome. Cr is trending down again today.  Continue on prednisone for acute interstitial nephritis as per nephro. Continue on torsemide as per nephro. Hold lasix, coreg, imdur,  aspirin and any other nephrotoxic meds as pt's kidney biopsy showed hypersensitivity reaction but will restart at d/c Nephro following and recs apprec  Acute on chronic systolic CHF exacerbation: echo showed EF 30-35%  02/27/2020. Continue on torsemide. Strict I/Os & daily weights. Initially required BiPAP and now currently on 2L [place]. Hold lasix secondary to AKI on CKD. Not on ACE/ARB/spironolactone due to renal dysfunction  Thrombocytosis: etiology unclear. Will continue to monitor    CAD: s/p CABG. Continue to hold home dose of aspirin, imdur, statin, coreg as pt's kidney biopsy showed hypersensitivity   COPD: w/o exacerbation. Encourage incentive spirometry and continue on bronchodilators  Likely ACD: likely secondary to CKD. No need for a transfusion at this time.    Hypokalemia: WNL today. Will continue   DM2: poorly controlled, HbA1c 8.6. Continue on SSI w/ accuchecks.   HTN: continue on amlodipine   HLD: continue to hold statin   OSA: does not use CPAP at home   Chronic back pain: CT L spine shows mild degenerative change. No cause of acute lumbar pain. Morphine, oxycodone prn  Constipation: continue on colace, dulcolax    Discharge Instructions  Discharge Instructions    Diet - low sodium heart healthy   Complete by: As directed    & a renal diet   Discharge instructions   Complete by: As directed    F/u w/ PCP in 1-2 weeks. F/u w/ nephro, Terry Arroyo in 1-2 weeks   Increase activity slowly   Complete by: As directed      Allergies as of 10/28/2020      Reactions   Hydralazine Shortness Of Breath, Swelling   Body aches   Shellfish Allergy Anaphylaxis   Shrimp/lobster  Shrimp/lobster    Kiwi Extract Swelling   Metformin Other (See Comments)   Other reaction(s): GI Intolerance   Tape Itching   Use paper tape whenever possible      Medication List    STOP taking these medications   furosemide 40 MG tablet Commonly known as: LASIX     TAKE these medications   albuterol 108 (90 Base) MCG/ACT inhaler Commonly known as: VENTOLIN HFA Inhale 1-2 puffs into the lungs every 6 (six) hours as needed for wheezing or shortness of breath.   amLODipine 10 MG tablet Commonly  known as: NORVASC Take 1 tablet (10 mg total) by mouth daily.   ammonium lactate 12 % lotion Commonly known as: LAC-HYDRIN Apply 1 application topically as needed for dry skin.   aspirin 81 MG EC tablet Take 1 tablet (81 mg total) by mouth daily. Do not take this medication for 1 week and then restart.   atorvastatin 80 MG tablet Commonly known as: LIPITOR Take 1 tablet (80 mg total) by mouth daily at 6 PM.   carvedilol 25 MG tablet Commonly known as: COREG Take 25 mg by mouth 2 (two) times daily with a meal.   cyanocobalamin 1000 MCG tablet Take 1,000 mcg by mouth daily.   cyclobenzaprine 5 MG tablet Commonly known as: FLEXERIL Take 5 mg by mouth 2 (two) times daily as needed for muscle spasms.   fenofibrate 145 MG tablet Commonly known as: TRICOR Take 145 mg by mouth daily.   GLYCERIN-HYPROMELLOSE-PEG 400 OP Place 1 drop into both eyes 4 (four) times daily.   insulin aspart protamine- aspart (70-30) 100 UNIT/ML injection Commonly known as: NOVOLOG MIX 70/30 Inject 0.4 mLs (40 Units total) into the skin 2 (two) times daily with a meal. What changed:  how much to take   isosorbide mononitrate 30 MG 24 hr tablet Commonly known as: IMDUR Take 30 mg by mouth daily.   mirtazapine 30 MG tablet Commonly known as: REMERON Take 1 tablet (30 mg total) by mouth at bedtime.   mometasone-formoterol 200-5 MCG/ACT Aero Commonly known as: DULERA Inhale 2 puffs into the lungs 2 (two) times daily.   omeprazole 20 MG capsule Commonly known as: PRILOSEC Take 20 mg by mouth daily.   prednisoLONE acetate 1 % ophthalmic suspension Commonly known as: PRED FORTE Place 1 drop into the right eye 4 (four) times daily.   predniSONE 20 MG tablet Commonly known as: DELTASONE Take 1 tablet (20 mg total) by mouth daily with breakfast for 14 days. Start taking on: October 29, 2020   sodium bicarbonate 650 MG tablet Take 2 tablets (1,300 mg total) by mouth 3 (three) times daily.    topiramate 25 MG tablet Commonly known as: TOPAMAX Take 25 mg by mouth daily.   torsemide 10 MG tablet Commonly known as: DEMADEX Take 2 tablets (20 mg total) by mouth daily.            Durable Medical Equipment  (From admission, onward)         Start     Ordered   10/23/20 1052  For home use only DME Shower stool  Once        10/23/20 1051          Follow-up Information    [facility] up on 11/05/2020.   Specialty: Cardiology Why: at 11:00am. Enter through the [place] entrance Contact information: [address] [place]             Allergies  Allergen Reactions  . Hydralazine Shortness Of Breath and Swelling    Body aches  . Shellfish Allergy Anaphylaxis    Shrimp/lobster  Shrimp/lobster    . Kiwi Extract Swelling  . Metformin Other (See Comments)    Other reaction(s): GI Intolerance  . Tape Itching    Use paper tape whenever possible    Consultations:  Nephro    Procedures/Studies: DG Chest 2 View  Result Date: 10/10/2020 CLINICAL DATA:  Dyspnea EXAM: CHEST - 2 VIEW COMPARISON:  02/26/2020 FINDINGS: The lungs are symmetrically well expanded. Tiny right pleural effusion has developed. Multifocal nodular infiltrate has developed within the left lung, likely infectious or inflammatory in nature. Minimal infiltrate may be present at the right lung base. No pneumothorax. Coronary artery bypass grafting has been performed. Cardiac size within normal limits. No acute bone abnormality. IMPRESSION: Interval development of nodular asymmetric pulmonary infiltrate, likely infectious or inflammatory. Electronically Signed   By: Fidela Salisbury MD   On: 10/10/2020 12:23   CT THORACIC SPINE WO CONTRAST  Result Date: 10/24/2020 CLINICAL DATA:  Mid back pain EXAM: CT THORACIC SPINE WITHOUT CONTRAST TECHNIQUE: Multidetector CT images of the thoracic were  obtained using the standard protocol without intravenous contrast. COMPARISON:  CT chest 01/22/2020 FINDINGS: Alignment: Normal Vertebrae: Negative for fracture or mass. No evidence of spinal infection. Paraspinal and other soft tissues: Negative for paraspinous No fluid mass collection or edema. Small bilateral pleural effusions. Diffuse mild bilateral airspace disease. Disc levels: No significant disc space narrowing. No disc degeneration or spurring identified. Negative for spinal stenosis. IMPRESSION: Negative CT thoracic spine Bilateral airspace disease and bilateral small pleural effusions which may represent congestive heart failure based on prior chest  x-ray. Electronically Signed   By: Franchot Gallo M.D.   On: 10/24/2020 15:14   CT LUMBAR SPINE WO CONTRAST  Result Date: 10/24/2020 CLINICAL DATA:  Low back pain EXAM: CT LUMBAR SPINE WITHOUT CONTRAST TECHNIQUE: Multidetector CT imaging of the lumbar spine was performed without intravenous contrast administration. Multiplanar CT image reconstructions were also generated. COMPARISON:  CT abdomen pelvis 09/12/2020 FINDINGS: Segmentation: Normal Alignment: Normal Vertebrae: Negative for fracture or mass. No evidence of spinal infection. Paraspinal and other soft tissues: Atherosclerotic abdominal aorta without aneurysm. Lobular contours to the left renal cortex unchanged. Mild left hydronephrosis unchanged. No obstructing calculus. No paraspinous soft tissue edema or fluid collection. Disc levels: L1-2: Negative L2-3: Mild disc and facet degeneration. Negative for disc protrusion or stenosis. L3-4: Mild disc and mild facet degeneration. Negative for disc protrusion or stenosis L4-5: Mild disc bulging and mild facet degeneration. Negative for disc protrusion or stenosis. L5-S1: Mild disc degeneration. Negative for disc protrusion or stenosis. IMPRESSION: Mild lumbar degenerative change.  No cause for acute lumbar pain. Electronically Signed   By: Franchot Gallo M.D.   On: 10/24/2020 15:23   DG Chest Portable 1 View  Result Date: 10/21/2020 CLINICAL DATA:  Shortness of breath for 1 week. EXAM: PORTABLE CHEST 1 VIEW COMPARISON:  10/10/2020 FINDINGS: Post median sternotomy and CABG mild cardiomegaly. Progressive bilateral pleural effusions, now moderate in degree. Fluid in the right minor fissure. Mild pulmonary edema. The previous nodular densities in the left greater than right lung are not definitively seen and may be partially obscured on the current exam by pulmonary edema. No pneumothorax. No acute osseous abnormalities are seen. IMPRESSION: Mild cardiomegaly with pulmonary edema and bilateral pleural effusions, consistent with CHF. Electronically Signed   By: Keith Rake M.D.   On: 10/21/2020 19:35      Subjective: Pt c/o fatigue    Discharge Exam: Vitals:   10/28/20 1048 10/28/20 1137  BP: (!) 172/99 (!) 186/93  Pulse: 72 75  Resp:  19  Temp:  97.9 F (36.6 C)  SpO2: 100% 100%   Vitals:   10/28/20 0413 10/28/20 0750 10/28/20 1048 10/28/20 1137  BP:  (!) 181/86 (!) 172/99 (!) 186/93  Pulse:  80 72 75  Resp:  19  19  Temp:  98.1 F (36.7 C)  97.9 F (36.6 C)  TempSrc:  Oral  Oral  SpO2:  99% 100% 100%  Weight: 88.3 kg       General: Pt is alert, awake, not in acute distress Cardiovascular: S1/S2 +, no rubs, no gallops Respiratory: CTA bilaterally, no wheezing, no rhonchi Abdominal: Soft, NT, ND, bowel sounds + Extremities:  no cyanosis    The results of significant diagnostics from this hospitalization (including imaging, microbiology, ancillary and laboratory) are listed below for reference.     Microbiology: Recent Results (from the past 240 hour(s))  Respiratory Panel by RT PCR (Flu A&B, Covid) - Nasopharyngeal Swab     Status: None   Collection Time: 10/21/20 11:51 PM   Specimen: Nasopharyngeal Swab  Result Value Ref Range Status   SARS Coronavirus 2 by RT PCR NEGATIVE NEGATIVE Final    Comment:  (NOTE) SARS-CoV-2 target nucleic acids are NOT DETECTED.  The SARS-CoV-2 RNA is generally detectable in upper respiratoy specimens during the acute phase of infection. The lowest concentration of SARS-CoV-2 viral copies this assay can detect is 131 copies/mL. A negative result does not preclude SARS-Cov-2 infection and should not be used as the sole basis for  treatment or other patient management decisions. A negative result may occur with  improper specimen collection/handling, submission of specimen other than nasopharyngeal swab, presence of viral mutation(s) within the areas targeted by this assay, and inadequate number of viral copies (<131 copies/mL). A negative result must be combined with clinical observations, patient history, and epidemiological information. The expected result is Negative.  Fact Sheet for Patients:  PinkCheek.be  Fact Sheet for Healthcare Providers:  GravelBags.it  This test is no t yet approved or cleared by the [place] FDA and  has been authorized for detection and/or diagnosis of SARS-CoV-2 by FDA under an Emergency Use Authorization (EUA). This EUA will remain  in effect (meaning this test can be used) for the duration of the COVID-19 declaration under Section 564(b)(1) of the Act, 21 U.S.C. section 360bbb-3(b)(1), unless the authorization is terminated or revoked sooner.     Influenza A by PCR NEGATIVE NEGATIVE Final   Influenza B by PCR NEGATIVE NEGATIVE Final    Comment: (NOTE) The Xpert Xpress SARS-CoV-2/FLU/RSV assay is intended as an aid in  the diagnosis of influenza from Nasopharyngeal swab specimens and  should not be used as a sole basis for treatment. Nasal washings and  aspirates are unacceptable for Xpert Xpress SARS-CoV-2/FLU/RSV  testing.  Fact Sheet for Patients: PinkCheek.be  Fact Sheet for Healthcare  Providers: GravelBags.it  This test is not yet approved or cleared by the [place] FDA and  has been authorized for detection and/or diagnosis of SARS-CoV-2 by  FDA under an Emergency Use Authorization (EUA). This EUA will remain  in effect (meaning this test can be used) for the duration of the  Covid-19 declaration under Section 564(b)(1) of the Act, 21  U.S.C. section 360bbb-3(b)(1), unless the authorization is  terminated or revoked. Performed at [facility], [address] [place], [place] [address]      Labs: BNP (last 3 results) Recent Labs    02/26/20 1302 10/10/20 1134 10/21/20 1931  BNP 610.0* 1,251.0* 7,322.0*   Basic Metabolic Panel: Recent Labs  Lab 10/22/20 0500 10/23/20 0350 10/24/20 0342 10/25/20 0343 10/26/20 0645 10/27/20 0452 10/28/20 0404  NA 143   < > 137 138 132* 132* 132*  K 3.3*   < > 3.1* 3.6 3.7 3.5 3.4*  CL 111   < > 103 103 99 98 99  CO2 21*   < > 22 23 21* 23 24  GLUCOSE 261*   < > 213* 363* 372* 346* 310*  BUN 43*   < > 47* 51* 63* 72* 77*  CREATININE 3.37*   < > 3.92* 4.01* 3.95* 3.81* 3.84*  CALCIUM 8.3*   < > 8.2* 8.3* 8.8* 8.7* 8.0*  MG 1.8  --   --   --   --   --   --    < > = values in this interval not displayed.   Liver Function Tests: Recent Labs  Lab 10/21/20 1931  AST 17  ALT 8  ALKPHOS 70  BILITOT 1.1  PROT 6.7  ALBUMIN 2.4*   No results for input(s): LIPASE, AMYLASE in the last 168 hours. No results for input(s): AMMONIA in the last 168 hours. CBC: Recent Labs  Lab 10/21/20 1931 10/22/20 0500 10/24/20 0342 10/25/20 0343 10/26/20 0645 10/27/20 0452 10/28/20 0404  WBC 12.2*   < > 12.8* 20.2* 18.9* 13.7* 10.0  NEUTROABS 9.3*  --   --   --   --   --   --  HGB 10.0*   < > 8.9* 9.4* 10.1* 9.9* 8.6*  HCT 31.1*   < > 27.8* 28.6* 30.4* 29.1* 26.4*  MCV 92.0   < > 91.1 89.7 88.9 86.9 88.3  PLT 376   < > 438* 447* 466* 511* 486*   < > = values in this interval not  displayed.   Cardiac Enzymes: No results for input(s): CKTOTAL, CKMB, CKMBINDEX, TROPONINI in the last 168 hours. BNP: Invalid input(s): POCBNP CBG: Recent Labs  Lab 10/27/20 1230 10/27/20 1633 10/27/20 2003 10/28/20 0750 10/28/20 1128  GLUCAP 429* 159* 173* 344* 229*   D-Dimer No results for input(s): DDIMER in the last 72 hours. Hgb A1c No results for input(s): HGBA1C in the last 72 hours. Lipid Profile No results for input(s): CHOL, HDL, LDLCALC, TRIG, CHOLHDL, LDLDIRECT in the last 72 hours. Thyroid function studies No results for input(s): TSH, T4TOTAL, T3FREE, THYROIDAB in the last 72 hours.  Invalid input(s): FREET3 Anemia work up No results for input(s): VITAMINB12, FOLATE, FERRITIN, TIBC, IRON, RETICCTPCT in the last 72 hours. Urinalysis    Component Value Date/Time   COLORURINE STRAW (A) 10/21/2020 0829   APPEARANCEUR CLEAR (A) 10/21/2020 0829   LABSPEC 1.007 10/21/2020 0829   PHURINE 7.0 10/21/2020 0829   GLUCOSEU 150 (A) 10/21/2020 0829   HGBUR NEGATIVE 10/21/2020 0829   BILIRUBINUR NEGATIVE 10/21/2020 0829   KETONESUR NEGATIVE 10/21/2020 0829   PROTEINUR >=300 (A) 10/21/2020 0829   NITRITE NEGATIVE 10/21/2020 0829   LEUKOCYTESUR SMALL (A) 10/21/2020 0829   Sepsis Labs Invalid input(s): PROCALCITONIN,  WBC,  LACTICIDVEN Microbiology Recent Results (from the past 240 hour(s))  Respiratory Panel by RT PCR (Flu A&B, Covid) - Nasopharyngeal Swab     Status: None   Collection Time: 10/21/20 11:51 PM   Specimen: Nasopharyngeal Swab  Result Value Ref Range Status   SARS Coronavirus 2 by RT PCR NEGATIVE NEGATIVE Final    Comment: (NOTE) SARS-CoV-2 target nucleic acids are NOT DETECTED.  The SARS-CoV-2 RNA is generally detectable in upper respiratoy specimens during the acute phase of infection. The lowest concentration of SARS-CoV-2 viral copies this assay can detect is 131 copies/mL. A negative result does not preclude SARS-Cov-2 infection and should  not be used as the sole basis for treatment or other patient management decisions. A negative result may occur with  improper specimen collection/handling, submission of specimen other than nasopharyngeal swab, presence of viral mutation(s) within the areas targeted by this assay, and inadequate number of viral copies (<131 copies/mL). A negative result must be combined with clinical observations, patient history, and epidemiological information. The expected result is Negative.  Fact Sheet for Patients:  PinkCheek.be  Fact Sheet for Healthcare Providers:  GravelBags.it  This test is no t yet approved or cleared by the [place] FDA and  has been authorized for detection and/or diagnosis of SARS-CoV-2 by FDA under an Emergency Use Authorization (EUA). This EUA will remain  in effect (meaning this test can be used) for the duration of the COVID-19 declaration under Section 564(b)(1) of the Act, 21 U.S.C. section 360bbb-3(b)(1), unless the authorization is terminated or revoked sooner.     Influenza A by PCR NEGATIVE NEGATIVE Final   Influenza B by PCR NEGATIVE NEGATIVE Final    Comment: (NOTE) The Xpert Xpress SARS-CoV-2/FLU/RSV assay is intended as an aid in  the diagnosis of influenza from Nasopharyngeal swab specimens and  should not be used as a sole basis for treatment. Nasal washings and  aspirates are unacceptable  for Xpert Xpress SARS-CoV-2/FLU/RSV  testing.  Fact Sheet for Patients: PinkCheek.be  Fact Sheet for Healthcare Providers: GravelBags.it  This test is not yet approved or cleared by the [place] FDA and  has been authorized for detection and/or diagnosis of SARS-CoV-2 by  FDA under an Emergency Use Authorization (EUA). This EUA will remain  in effect (meaning this test can be used) for the duration of the  Covid-19 declaration under  Section 564(b)(1) of the Act, 21  U.S.C. section 360bbb-3(b)(1), unless the authorization is  terminated or revoked. Performed at [facility], [address], [place], [place] [address]      Time coordinating discharge: Over 30 minutes  SIGNED:   Wyvonnia Dusky, MD  [facility] 10/28/2020, 2:04 PM Pager   If 7PM-7AM, please contact night-coverage

## 2020-10-28 NOTE — Progress Notes (Signed)
[facility]  ROUNDING NOTE   Subjective:   Patient sitting up at the side of the bed, appears much comfortable today. She is requesting to go home today.  Objective:  Vital signs in last 24 hours:  Temp:  [97.9 F (36.6 C)-98.7 F (37.1 C)] 97.9 F (36.6 C) (11/09 1137) Pulse Rate:  [70-80] 75 (11/09 1137) Resp:  [17-19] 19 (11/09 1137) BP: (157-186)/(80-99) 186/93 (11/09 1137) SpO2:  [99 %-100 %] 100 % (11/09 1137) Weight:  [88.3 kg] 88.3 kg (11/09 0413)  Weight change: 1.17 kg Filed Weights   10/26/20 0436 10/27/20 0357 10/28/20 0413  Weight: 87.5 kg 87.1 kg 88.3 kg    Intake/Output: I/O last 3 completed shifts: In: 72 [P.O.:478] Out: 4000 [Urine:4000]   Intake/Output this shift:  Total I/O In: 840 [P.O.:840] Out: 1400 [Urine:1400]  Physical Exam: General: ISitting up in bed, in no acute distress  Head: Normocephalic, atraumatic  Eyes: Sclerae and conjunctivae clear  Lungs:  Lungs clear, normal respiratory effort  Heart: Regular  Abdomen:  Soft, nontender, non distended  Extremities:  1+peripheral edema.  Neurologic: Speech clear and appropriate  Skin: No acute lesions or rashes    Basic Metabolic Panel: Recent Labs  Lab 10/22/20 0500 10/23/20 0350 10/24/20 0342 10/24/20 0342 10/25/20 0343 10/25/20 0343 10/26/20 0645 10/27/20 0452 10/28/20 0404  NA 143   < > 137  --  138  --  132* 132* 132*  K 3.3*   < > 3.1*  --  3.6  --  3.7 3.5 3.4*  CL 111   < > 103  --  103  --  99 98 99  CO2 21*   < > 22  --  23  --  21* 23 24  GLUCOSE 261*   < > 213*  --  363*  --  372* 346* 310*  BUN 43*   < > 47*  --  51*  --  63* 72* 77*  CREATININE 3.37*   < > 3.92*  --  4.01*  --  3.95* 3.81* 3.84*  CALCIUM 8.3*   < > 8.2*   < > 8.3*   < > 8.8* 8.7* 8.0*  MG 1.8  --   --   --   --   --   --   --   --    < > = values in this interval not displayed.    Liver Function Tests: Recent Labs  Lab 10/21/20 1931  AST 17  ALT 8  ALKPHOS 70  BILITOT 1.1   PROT 6.7  ALBUMIN 2.4*   No results for input(s): LIPASE, AMYLASE in the last 168 hours. No results for input(s): AMMONIA in the last 168 hours.  CBC: Recent Labs  Lab 10/21/20 1931 10/22/20 0500 10/24/20 0342 10/25/20 0343 10/26/20 0645 10/27/20 0452 10/28/20 0404  WBC 12.2*   < > 12.8* 20.2* 18.9* 13.7* 10.0  NEUTROABS 9.3*  --   --   --   --   --   --   HGB 10.0*   < > 8.9* 9.4* 10.1* 9.9* 8.6*  HCT 31.1*   < > 27.8* 28.6* 30.4* 29.1* 26.4*  MCV 92.0   < > 91.1 89.7 88.9 86.9 88.3  PLT 376   < > 438* 447* 466* 511* 486*   < > = values in this interval not displayed.    Cardiac Enzymes: No results for input(s): CKTOTAL, CKMB, CKMBINDEX, TROPONINI in the last 168 hours.  BNP: Invalid  input(s): POCBNP  CBG: Recent Labs  Lab 10/27/20 1230 10/27/20 1633 10/27/20 2003 10/28/20 0750 10/28/20 1128  GLUCAP 429* 159* 173* 344* 229*    Microbiology: Results for orders placed or performed during the hospital encounter of 10/21/20  Respiratory Panel by RT PCR (Flu A&B, Covid) - Nasopharyngeal Swab     Status: None   Collection Time: 10/21/20 11:51 PM   Specimen: Nasopharyngeal Swab  Result Value Ref Range Status   SARS Coronavirus 2 by RT PCR NEGATIVE NEGATIVE Final    Comment: (NOTE) SARS-CoV-2 target nucleic acids are NOT DETECTED.  The SARS-CoV-2 RNA is generally detectable in upper respiratoy specimens during the acute phase of infection. The lowest concentration of SARS-CoV-2 viral copies this assay can detect is 131 copies/mL. A negative result does not preclude SARS-Cov-2 infection and should not be used as the sole basis for treatment or other patient management decisions. A negative result may occur with  improper specimen collection/handling, submission of specimen other than nasopharyngeal swab, presence of viral mutation(s) within the areas targeted by this assay, and inadequate number of viral copies (<131 copies/mL). A negative result must be  combined with clinical observations, patient history, and epidemiological information. The expected result is Negative.  Fact Sheet for Patients:  PinkCheek.be  Fact Sheet for Healthcare Providers:  GravelBags.it  This test is no t yet approved or cleared by the [place] FDA and  has been authorized for detection and/or diagnosis of SARS-CoV-2 by FDA under an Emergency Use Authorization (EUA). This EUA will remain  in effect (meaning this test can be used) for the duration of the COVID-19 declaration under Section 564(b)(1) of the Act, 21 U.S.C. section 360bbb-3(b)(1), unless the authorization is terminated or revoked sooner.     Influenza A by PCR NEGATIVE NEGATIVE Final   Influenza B by PCR NEGATIVE NEGATIVE Final    Comment: (NOTE) The Xpert Xpress SARS-CoV-2/FLU/RSV assay is intended as an aid in  the diagnosis of influenza from Nasopharyngeal swab specimens and  should not be used as a sole basis for treatment. Nasal washings and  aspirates are unacceptable for Xpert Xpress SARS-CoV-2/FLU/RSV  testing.  Fact Sheet for Patients: PinkCheek.be  Fact Sheet for Healthcare Providers: GravelBags.it  This test is not yet approved or cleared by the [place] FDA and  has been authorized for detection and/or diagnosis of SARS-CoV-2 by  FDA under an Emergency Use Authorization (EUA). This EUA will remain  in effect (meaning this test can be used) for the duration of the  Covid-19 declaration under Section 564(b)(1) of the Act, 21  U.S.C. section 360bbb-3(b)(1), unless the authorization is  terminated or revoked. Performed at [facility], [address] [place], [place] [address]     Coagulation Studies: No results for input(s): LABPROT, INR in the last 72 hours.  Urinalysis: No results for input(s): COLORURINE, LABSPEC, PHURINE, GLUCOSEU,  HGBUR, BILIRUBINUR, KETONESUR, PROTEINUR, UROBILINOGEN, NITRITE, LEUKOCYTESUR in the last 72 hours.  Invalid input(s): APPERANCEUR    Imaging: No results found.   Medications:    . amLODipine  10 mg Oral Daily  . calcium carbonate  1,250 mg Oral Q breakfast  . docusate sodium  200 mg Oral BID  . heparin  5,000 Units Subcutaneous Q8H  . influenza vac split quadrivalent PF  0.5 mL Intramuscular Tomorrow-1000  . insulin aspart  0-15 Units Subcutaneous TID WC  . insulin aspart  0-5 Units Subcutaneous QHS  . insulin aspart protamine- aspart  10 Units Subcutaneous BID  WC  . mometasone-formoterol  2 puff Inhalation BID  . pantoprazole  40 mg Oral Daily  . prednisoLONE acetate  1 drop Right Eye QID  . predniSONE  20 mg Oral Q breakfast  . sodium bicarbonate  650 mg Oral BID  . sodium chloride flush  3 mL Intravenous Q12H  . torsemide  20 mg Oral Daily   acetaminophen **OR** acetaminophen, bisacodyl, cyclobenzaprine, ipratropium-albuterol, labetalol, meclizine, morphine injection, ondansetron **OR** ondansetron (ZOFRAN) IV, oxyCODONE  Assessment/ Plan:  [name] is a 59 y.o.  female with medical problems of coronary artery disease status post CABG in 2018 Stroke 2014 CKD Asthma Hypertension Hyperlipidemia Diabetes type 2, poorly controlled Pituitary tumor status post removal in 1992 Blindness in left eye Right perinephric hematoma status post kidney biopsy September 2021 Chronic mild hydronephrosis bilaterally  presented to the ED for worsening SOB, and dyspnea on exertion, has h/o CHF with EF 30 -35%,CKD Stage IV worsening to V, Diabetes and hypertension. She was admitted to the hospital recently for COPD exacerbation and pneumonia.  Renal biopsy from September 11, 2020 shows acute tubular injury with mild acute interstitial nephritis suggestive of a hypersensitivity etiology.  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  No evidence of immune complex mediated or active GN.  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   # Acute Kidney Injury on CKD Stage IV  Baseline Creatinine 3.14 on 09/15/2020 with GFR 18  Lab Results  Component Value Date   CREATININE 3.84 (H) 10/28/2020   CREATININE 3.81 (H) 10/27/2020   CREATININE 3.95 (H) 10/26/2020  Creatinine levels stays relatively stable No acute indication for dialysis Will follow up as outpatient if getting discharged today    #Diabetes type 2 with CKD Lab Results  Component Value Date   HGBA1C 8.6 (H) 10/11/2020    Not well controlled  # Acute on chronic systolic CHF exacerbation: #Lower extremity edema EF 30-35% from TTE on 02/27/2020 Torsemide 20 mg PO daily Continues to have peripheral edema  #Hyponatremia Sodium stable at 132   LOS: 7 Jarmaine Ehrler 11/9/20212:11 PM

## 2020-10-28 NOTE — Progress Notes (Signed)
Mobility Specialist - Progress Note   10/28/20 1300  Mobility  Activity Sat and stood x 3  Range of Motion/Exercises Right leg;Left leg (ankle pumps, hip iso, slr, seated/standing march, heel slide)  Level of Assistance Modified independent, requires aide device or extra time  Assistive Device Front wheel walker  Distance Ambulated (ft) 0 ft  Mobility Response Tolerated well  Mobility performed by Mobility specialist  $Mobility charge 1 Mobility    Pre-mobility: 79 HR, 100% SpO2 Post-mobility: 75 HR, 95% SpO2   Pt was sitting in bed upon arrival utilizing room air. Pt agreed to session. Pt was modI in all activities this session. Pt performed seated exercises: ankle pumps, hip isometrics, heel slides, seated march, straight leg raises. Pt performed STSx3 and progressed to standing march on 3rd attempt. No LOB noted. Pt denied dizziness or weakness. Pt rated her RPE this session a 5/10. O2 > 95% throughout session. Overall, pt tolerated session well. Pt was left in bed with all needs in reach. Nurse entered at the end of session and is aware of performance.    Kathee Delton Mobility Specialist 10/28/20, 1:48 PM

## 2020-10-28 NOTE — TOC Transition Note (Signed)
Transition of Care [facility]) - CM/SW Discharge Note   Patient Details  Name: Terry Arroyo MRN: [id number] Date of Birth: 1961/02/04  Transition of Care [facility]) CM/SW Contact:  Eileen Stanford, LCSW Phone Number: 10/28/2020, 3:13 PM   Clinical Narrative: Pt is medically stable for d/c. HH has been arranged through [facility]. Pt will return to [facility] Pt has no ride back to facility. Taxi voucher provided.      Final next level of care: Home w Home Health Services Barriers to Discharge: No Barriers Identified   Patient Goals and CMS Choice Patient states their goals for this hospitalization and ongoing recovery are:: to get better   Choice offered to / list presented to : Patient  Discharge Placement                Patient to be transferred to facility by: taxi voucher provided   Patient and family notified of of transfer: 10/28/20  Discharge Plan and Services In-house Referral: Clinical Social Work   Post Acute Care Choice: Home Health          DME Arranged: Shower stool DME Agency: [employer] Date DME Agency Contacted: 10/23/20 Time DME Agency Contacted: [phone] Representative spoke with at DME Agency: [place]: [place] Determinants of Health ([place]) Interventions     Readmission Risk Interventions Readmission Risk Prevention Plan 10/23/2020 10/12/2020  Transportation Screening Complete Complete  Medication Review Press photographer) Complete Complete  PCP or Specialist appointment within 3-5 days of discharge Complete Complete  HRI or Home Care Consult Complete -  SW Recovery Care/Counseling Consult Complete Complete  Palliative Care Screening Not Applicable Not [place] Not Applicable Not Complete  SNF Comments - Patient pending evaluation  Some recent data might be hidden

## 2020-10-28 NOTE — Evaluation (Signed)
Physical Therapy Evaluation Patient Details Name: Terry Arroyo MRN: [id number] DOB: 04-24-1961 Today's Date: 10/28/2020   History of Present Illness   Patient is a 59 year old female admitted to the hospital recently for COPD exacerbation and pneumonia after presenting to the ED with  worsening SOB and dyspnea on exertion. Patient has history of CHF with EF 30-35%, diabetes, hypertension, coronary artery disease status post CABG in 2018 Stroke 2014, CKD, Asthma, pituitary tumor removal and blindness in left eye   Clinical Impression  PT evaluation completed. Patient reports she is usually ambulatory with single point cane at home and in the community. Patient currently is Modified independent with transfers with good safety awareness demonstrated. Patient ambulated in room with rolling walker for support and supervision for safety, minimal cues for rolling walker use and proper body mechanics. Patient stating she prefers the rolling walker at this time over the cane due to generalized weakness and fatigue with activity. Patient has limited overall endurance during functional activity and required rest break due to fatigue with activity. Blood pressure 172/70mmHg, Sp02 100% on room air, and heart rate 72bpm after walking. No dizziness is reported during activity. Recommend PT to address functional limitations listed below to maximize independence and return to PLOF as patient previously ambulating with single point cane.     Follow Up Recommendations Home health PT    Equipment Recommendations  Rolling walker with 5" wheels    Recommendations for Other Services       Precautions / Restrictions Precautions Precautions: Fall Restrictions Weight Bearing Restrictions: No      Mobility  Bed Mobility               General bed mobility comments: not assessed as patient sitting up on arrival and post session     Transfers Overall transfer level: Needs assistance   Transfers: Sit  to/from Stand Sit to Stand: Modified independent (Device/Increase time)         General transfer comment: no physical assistance is required with functional transfers from bed   Ambulation/Gait Ambulation/Gait assistance: Supervision Gait Distance (Feet): 30 Feet Assistive device: Rolling walker (2 wheeled) Gait Pattern/deviations: Wide base of support Gait velocity: decreased   General Gait Details: Patient ambulated in room with rolling walker for support. Verbal cues for proper use of rolling walker placement for improved body mechanics. Patient states she prefers rolling walker over the cane at this time for increased support with ambulation   Stairs            Wheelchair Mobility    Modified Rankin (Stroke Patients Only)       Balance Overall balance assessment: Needs assistance Sitting-balance support: No upper extremity supported;Feet supported Sitting balance-Leahy Scale: Normal Sitting balance - Comments: patient able to reach outside base of support without loss of balance    Standing balance support: Bilateral upper extremity supported;During functional activity Standing balance-Leahy Scale: Fair Standing balance comment: patient relying on rolling walker for support in standing position                              Pertinent Vitals/Pain Pain Assessment: No/denies pain    Home Living Family/patient expects to be discharged to:: Private residence Living Arrangements: Alone Available Help at Discharge: Family;Available PRN/intermittently Type of Home: Apartment Home Access: Level entry;Elevator     Home Layout: One level Home Equipment: Cane - single point      Prior Function  Level of Independence: Independent with assistive device(s)         Comments: Patient is ambulatory with single point cane in the home and community. Patient has meals on wheels for all meals per her report      Hand Dominance        Extremity/Trunk  Assessment   Upper Extremity Assessment Upper Extremity Assessment: Generalized weakness    Lower Extremity Assessment Lower Extremity Assessment: LLE deficits/detail;RLE deficits/detail RLE Deficits / Details: knee extension and ankle dorsiflexion, plantarflexion 5/5. impaired endurance for sustained activity  LLE Deficits / Details: knee extension and ankle dorsiflexion, plantarflexion 5/5. impaired endurance for sustained activity        Communication   Communication: No difficulties  Cognition Arousal/Alertness: Awake/alert Behavior During Therapy: WFL for tasks assessed/performed Overall Cognitive Status: Within Functional Limits for tasks assessed                                        General Comments      Exercises     Assessment/Plan    PT Assessment Patient needs continued PT services  PT Problem List Decreased strength;Decreased activity tolerance;Decreased balance;Decreased knowledge of use of DME       PT Treatment Interventions DME instruction;Gait training;Stair training;Functional mobility training;Therapeutic activities;Therapeutic exercise;Balance training    PT Goals (Current goals can be found in the Care Plan section)  Acute Rehab PT Goals Patient Stated Goal: to have more strength  PT Goal Formulation: With patient Time For Goal Achievement: 11/11/20 Potential to Achieve Goals: Good    Frequency Min 2X/week   Barriers to discharge        Co-evaluation               AM-PAC PT "6 Clicks" Mobility  Outcome Measure Help needed turning from your back to your side while in a flat bed without using bedrails?: None Help needed moving from lying on your back to sitting on the side of a flat bed without using bedrails?: A Little Help needed moving to and from a bed to a chair (including a wheelchair)?: A Little Help needed standing up from a chair using your arms (e.g., wheelchair or bedside chair)?: A Little Help needed to  walk in hospital room?: A Little Help needed climbing 3-5 steps with a railing? : A Little 6 Click Score: 19    End of Session Equipment Utilized During Treatment: Gait belt Activity Tolerance: Patient limited by fatigue Patient left: with call bell/phone within reach;in bed (sitting up in bed, set-up for bathing provided per request ) Nurse Communication: Mobility status PT Visit Diagnosis: Muscle weakness (generalized) (M62.81);Unsteadiness on feet (R26.81)    Time: 0981-1914 PT Time Calculation (min) (ACUTE ONLY): 33 min   Charges:   PT Evaluation $PT Eval Moderate Complexity: 1 Mod PT Treatments $Therapeutic Activity: 8-22 mins        Minna Merritts, PT, MPT   Percell Locus 10/28/2020, 11:09 AM

## 2020-10-28 NOTE — Progress Notes (Addendum)
Patient BP elevated 186/93 - Loss of IV access, unable to give labetolol IV.  Paged MD- ordered 1 dose of coreg.  Will recheck BP

## 2020-10-29 DIAGNOSIS — N184 Chronic kidney disease, stage 4 (severe): Secondary | ICD-10-CM | POA: Diagnosis not present

## 2020-10-29 DIAGNOSIS — I5022 Chronic systolic (congestive) heart failure: Secondary | ICD-10-CM | POA: Diagnosis not present

## 2020-10-29 DIAGNOSIS — G4733 Obstructive sleep apnea (adult) (pediatric): Secondary | ICD-10-CM | POA: Diagnosis not present

## 2020-10-29 DIAGNOSIS — I130 Hypertensive heart and chronic kidney disease with heart failure and stage 1 through stage 4 chronic kidney disease, or unspecified chronic kidney disease: Secondary | ICD-10-CM | POA: Diagnosis not present

## 2020-10-29 DIAGNOSIS — E1122 Type 2 diabetes mellitus with diabetic chronic kidney disease: Secondary | ICD-10-CM | POA: Diagnosis not present

## 2020-10-29 DIAGNOSIS — E785 Hyperlipidemia, unspecified: Secondary | ICD-10-CM | POA: Diagnosis not present

## 2020-10-29 DIAGNOSIS — I2510 Atherosclerotic heart disease of native coronary artery without angina pectoris: Secondary | ICD-10-CM | POA: Diagnosis not present

## 2020-10-29 DIAGNOSIS — J449 Chronic obstructive pulmonary disease, unspecified: Secondary | ICD-10-CM | POA: Diagnosis not present

## 2020-10-29 DIAGNOSIS — E1165 Type 2 diabetes mellitus with hyperglycemia: Secondary | ICD-10-CM | POA: Diagnosis not present

## 2020-10-30 DIAGNOSIS — J449 Chronic obstructive pulmonary disease, unspecified: Secondary | ICD-10-CM | POA: Diagnosis not present

## 2020-10-30 DIAGNOSIS — I5022 Chronic systolic (congestive) heart failure: Secondary | ICD-10-CM | POA: Diagnosis not present

## 2020-10-30 DIAGNOSIS — G4733 Obstructive sleep apnea (adult) (pediatric): Secondary | ICD-10-CM | POA: Diagnosis not present

## 2020-10-30 DIAGNOSIS — E785 Hyperlipidemia, unspecified: Secondary | ICD-10-CM | POA: Diagnosis not present

## 2020-10-30 DIAGNOSIS — E1165 Type 2 diabetes mellitus with hyperglycemia: Secondary | ICD-10-CM | POA: Diagnosis not present

## 2020-10-30 DIAGNOSIS — I2510 Atherosclerotic heart disease of native coronary artery without angina pectoris: Secondary | ICD-10-CM | POA: Diagnosis not present

## 2020-10-30 DIAGNOSIS — E1122 Type 2 diabetes mellitus with diabetic chronic kidney disease: Secondary | ICD-10-CM | POA: Diagnosis not present

## 2020-10-30 DIAGNOSIS — N184 Chronic kidney disease, stage 4 (severe): Secondary | ICD-10-CM | POA: Diagnosis not present

## 2020-10-30 DIAGNOSIS — I130 Hypertensive heart and chronic kidney disease with heart failure and stage 1 through stage 4 chronic kidney disease, or unspecified chronic kidney disease: Secondary | ICD-10-CM | POA: Diagnosis not present

## 2020-11-01 ENCOUNTER — Emergency Department: Payer: Medicare HMO

## 2020-11-01 ENCOUNTER — Other Ambulatory Visit: Payer: Self-pay

## 2020-11-01 ENCOUNTER — Inpatient Hospital Stay
Admission: EM | Admit: 2020-11-01 | Discharge: 2020-11-12 | DRG: 291 | Disposition: A | Discharge status: Home Health | Payer: Medicare HMO | Attending: Internal Medicine | Admitting: Internal Medicine

## 2020-11-01 DIAGNOSIS — Z87891 Personal history of nicotine dependence: Secondary | ICD-10-CM

## 2020-11-01 DIAGNOSIS — N179 Acute kidney failure, unspecified: Secondary | ICD-10-CM | POA: Diagnosis not present

## 2020-11-01 DIAGNOSIS — I252 Old myocardial infarction: Secondary | ICD-10-CM

## 2020-11-01 DIAGNOSIS — E1169 Type 2 diabetes mellitus with other specified complication: Secondary | ICD-10-CM

## 2020-11-01 DIAGNOSIS — Z8249 Family history of ischemic heart disease and other diseases of the circulatory system: Secondary | ICD-10-CM

## 2020-11-01 DIAGNOSIS — R52 Pain, unspecified: Secondary | ICD-10-CM | POA: Diagnosis not present

## 2020-11-01 DIAGNOSIS — I5023 Acute on chronic systolic (congestive) heart failure: Secondary | ICD-10-CM | POA: Diagnosis not present

## 2020-11-01 DIAGNOSIS — Z951 Presence of aortocoronary bypass graft: Secondary | ICD-10-CM | POA: Diagnosis not present

## 2020-11-01 DIAGNOSIS — Z794 Long term (current) use of insulin: Secondary | ICD-10-CM | POA: Diagnosis not present

## 2020-11-01 DIAGNOSIS — G4733 Obstructive sleep apnea (adult) (pediatric): Secondary | ICD-10-CM | POA: Diagnosis present

## 2020-11-01 DIAGNOSIS — D631 Anemia in chronic kidney disease: Secondary | ICD-10-CM | POA: Diagnosis not present

## 2020-11-01 DIAGNOSIS — R0602 Shortness of breath: Secondary | ICD-10-CM | POA: Diagnosis not present

## 2020-11-01 DIAGNOSIS — M549 Dorsalgia, unspecified: Secondary | ICD-10-CM | POA: Diagnosis not present

## 2020-11-01 DIAGNOSIS — N19 Unspecified kidney failure: Secondary | ICD-10-CM

## 2020-11-01 DIAGNOSIS — Z20822 Contact with and (suspected) exposure to covid-19: Secondary | ICD-10-CM | POA: Diagnosis not present

## 2020-11-01 DIAGNOSIS — M898X9 Other specified disorders of bone, unspecified site: Secondary | ICD-10-CM | POA: Diagnosis present

## 2020-11-01 DIAGNOSIS — J449 Chronic obstructive pulmonary disease, unspecified: Secondary | ICD-10-CM | POA: Diagnosis not present

## 2020-11-01 DIAGNOSIS — J9811 Atelectasis: Secondary | ICD-10-CM | POA: Diagnosis not present

## 2020-11-01 DIAGNOSIS — E1122 Type 2 diabetes mellitus with diabetic chronic kidney disease: Secondary | ICD-10-CM | POA: Diagnosis not present

## 2020-11-01 DIAGNOSIS — N183 Chronic kidney disease, stage 3 unspecified: Secondary | ICD-10-CM

## 2020-11-01 DIAGNOSIS — I120 Hypertensive chronic kidney disease with stage 5 chronic kidney disease or end stage renal disease: Secondary | ICD-10-CM | POA: Diagnosis not present

## 2020-11-01 DIAGNOSIS — I2510 Atherosclerotic heart disease of native coronary artery without angina pectoris: Secondary | ICD-10-CM | POA: Diagnosis not present

## 2020-11-01 DIAGNOSIS — Z7982 Long term (current) use of aspirin: Secondary | ICD-10-CM

## 2020-11-01 DIAGNOSIS — R739 Hyperglycemia, unspecified: Secondary | ICD-10-CM

## 2020-11-01 DIAGNOSIS — R809 Proteinuria, unspecified: Secondary | ICD-10-CM | POA: Diagnosis not present

## 2020-11-01 DIAGNOSIS — R079 Chest pain, unspecified: Secondary | ICD-10-CM | POA: Diagnosis not present

## 2020-11-01 DIAGNOSIS — I5022 Chronic systolic (congestive) heart failure: Secondary | ICD-10-CM | POA: Diagnosis not present

## 2020-11-01 DIAGNOSIS — Z79899 Other long term (current) drug therapy: Secondary | ICD-10-CM

## 2020-11-01 DIAGNOSIS — I517 Cardiomegaly: Secondary | ICD-10-CM | POA: Diagnosis not present

## 2020-11-01 DIAGNOSIS — Z91018 Allergy to other foods: Secondary | ICD-10-CM

## 2020-11-01 DIAGNOSIS — E119 Type 2 diabetes mellitus without complications: Secondary | ICD-10-CM

## 2020-11-01 DIAGNOSIS — N170 Acute kidney failure with tubular necrosis: Secondary | ICD-10-CM

## 2020-11-01 DIAGNOSIS — J441 Chronic obstructive pulmonary disease with (acute) exacerbation: Secondary | ICD-10-CM | POA: Diagnosis not present

## 2020-11-01 DIAGNOSIS — I10 Essential (primary) hypertension: Secondary | ICD-10-CM | POA: Diagnosis not present

## 2020-11-01 DIAGNOSIS — Z91013 Allergy to seafood: Secondary | ICD-10-CM

## 2020-11-01 DIAGNOSIS — I248 Other forms of acute ischemic heart disease: Secondary | ICD-10-CM | POA: Diagnosis not present

## 2020-11-01 DIAGNOSIS — Z992 Dependence on renal dialysis: Secondary | ICD-10-CM

## 2020-11-01 DIAGNOSIS — E872 Acidosis: Secondary | ICD-10-CM | POA: Diagnosis not present

## 2020-11-01 DIAGNOSIS — R7989 Other specified abnormal findings of blood chemistry: Secondary | ICD-10-CM | POA: Diagnosis not present

## 2020-11-01 DIAGNOSIS — N2581 Secondary hyperparathyroidism of renal origin: Secondary | ICD-10-CM | POA: Diagnosis present

## 2020-11-01 DIAGNOSIS — R778 Other specified abnormalities of plasma proteins: Secondary | ICD-10-CM

## 2020-11-01 DIAGNOSIS — K5900 Constipation, unspecified: Secondary | ICD-10-CM | POA: Diagnosis not present

## 2020-11-01 DIAGNOSIS — D75838 Other thrombocytosis: Secondary | ICD-10-CM | POA: Diagnosis not present

## 2020-11-01 DIAGNOSIS — R1084 Generalized abdominal pain: Secondary | ICD-10-CM | POA: Diagnosis not present

## 2020-11-01 DIAGNOSIS — I130 Hypertensive heart and chronic kidney disease with heart failure and stage 1 through stage 4 chronic kidney disease, or unspecified chronic kidney disease: Secondary | ICD-10-CM | POA: Diagnosis not present

## 2020-11-01 DIAGNOSIS — N178 Other acute kidney failure: Secondary | ICD-10-CM | POA: Diagnosis not present

## 2020-11-01 DIAGNOSIS — Z803 Family history of malignant neoplasm of breast: Secondary | ICD-10-CM

## 2020-11-01 DIAGNOSIS — E876 Hypokalemia: Secondary | ICD-10-CM | POA: Diagnosis present

## 2020-11-01 DIAGNOSIS — Z7951 Long term (current) use of inhaled steroids: Secondary | ICD-10-CM

## 2020-11-01 DIAGNOSIS — E1165 Type 2 diabetes mellitus with hyperglycemia: Secondary | ICD-10-CM | POA: Diagnosis present

## 2020-11-01 DIAGNOSIS — N10 Acute tubulo-interstitial nephritis: Secondary | ICD-10-CM | POA: Diagnosis not present

## 2020-11-01 DIAGNOSIS — N184 Chronic kidney disease, stage 4 (severe): Secondary | ICD-10-CM | POA: Diagnosis not present

## 2020-11-01 DIAGNOSIS — H5462 Unqualified visual loss, left eye, normal vision right eye: Secondary | ICD-10-CM | POA: Diagnosis present

## 2020-11-01 DIAGNOSIS — N186 End stage renal disease: Secondary | ICD-10-CM | POA: Diagnosis not present

## 2020-11-01 DIAGNOSIS — I132 Hypertensive heart and chronic kidney disease with heart failure and with stage 5 chronic kidney disease, or end stage renal disease: Principal | ICD-10-CM | POA: Diagnosis present

## 2020-11-01 DIAGNOSIS — Z888 Allergy status to other drugs, medicaments and biological substances status: Secondary | ICD-10-CM

## 2020-11-01 DIAGNOSIS — N049 Nephrotic syndrome with unspecified morphologic changes: Secondary | ICD-10-CM | POA: Diagnosis not present

## 2020-11-01 DIAGNOSIS — I5031 Acute diastolic (congestive) heart failure: Secondary | ICD-10-CM | POA: Diagnosis not present

## 2020-11-01 DIAGNOSIS — E785 Hyperlipidemia, unspecified: Secondary | ICD-10-CM | POA: Diagnosis present

## 2020-11-01 DIAGNOSIS — Z8673 Personal history of transient ischemic attack (TIA), and cerebral infarction without residual deficits: Secondary | ICD-10-CM

## 2020-11-01 DIAGNOSIS — R600 Localized edema: Secondary | ICD-10-CM | POA: Diagnosis not present

## 2020-11-01 DIAGNOSIS — F418 Other specified anxiety disorders: Secondary | ICD-10-CM | POA: Diagnosis not present

## 2020-11-01 DIAGNOSIS — M545 Low back pain, unspecified: Secondary | ICD-10-CM | POA: Diagnosis not present

## 2020-11-01 DIAGNOSIS — R069 Unspecified abnormalities of breathing: Secondary | ICD-10-CM | POA: Diagnosis not present

## 2020-11-01 LAB — BLOOD GAS, VENOUS
Acid-base deficit: 0.8 mmol/L (ref 0.0–2.0)
Bicarbonate: 24.9 mmol/L (ref 20.0–28.0)
O2 Saturation: 54.2 %
Patient temperature: 37
pCO2, Ven: 44 mmHg (ref 44.0–60.0)
pH, Ven: 7.36 (ref 7.250–7.430)
pO2, Ven: <31 mmHg — CL (ref 32.0–45.0)

## 2020-11-01 LAB — CBC
HCT: 28 % — ABNORMAL LOW (ref 36.0–46.0)
Hemoglobin: 9.1 g/dL — ABNORMAL LOW (ref 12.0–15.0)
MCH: 29.1 pg (ref 26.0–34.0)
MCHC: 32.5 g/dL (ref 30.0–36.0)
MCV: 89.5 fL (ref 80.0–100.0)
Platelets: 469 10*3/uL — ABNORMAL HIGH (ref 150–400)
RBC: 3.13 MIL/uL — ABNORMAL LOW (ref 3.87–5.11)
RDW: 14.4 % (ref 11.5–15.5)
WBC: 11.7 10*3/uL — ABNORMAL HIGH (ref 4.0–10.5)
nRBC: 0 % (ref 0.0–0.2)

## 2020-11-01 LAB — BASIC METABOLIC PANEL
Anion gap: 11 (ref 5–15)
BUN: 60 mg/dL — ABNORMAL HIGH (ref 6–20)
CO2: 22 mmol/L (ref 22–32)
Calcium: 8 mg/dL — ABNORMAL LOW (ref 8.9–10.3)
Chloride: 101 mmol/L (ref 98–111)
Creatinine, Ser: 3.3 mg/dL — ABNORMAL HIGH (ref 0.44–1.00)
GFR, Estimated: 16 mL/min — ABNORMAL LOW (ref >60)
Glucose, Bld: 520 mg/dL (ref 70–99)
Potassium: 3 mmol/L — ABNORMAL LOW (ref 3.5–5.1)
Sodium: 134 mmol/L — ABNORMAL LOW (ref 135–145)

## 2020-11-01 LAB — RESPIRATORY PANEL BY RT PCR (FLU A&B, COVID)
Influenza A by PCR: NEGATIVE
Influenza B by PCR: NEGATIVE
SARS Coronavirus 2 by RT PCR: NEGATIVE

## 2020-11-01 LAB — CBG MONITORING, ED
Glucose-Capillary: 206 mg/dL — ABNORMAL HIGH (ref 70–99)
Glucose-Capillary: 391 mg/dL — ABNORMAL HIGH (ref 70–99)

## 2020-11-01 LAB — TROPONIN I (HIGH SENSITIVITY)
Troponin I (High Sensitivity): 110 ng/L (ref <18)
Troponin I (High Sensitivity): 99 ng/L — ABNORMAL HIGH (ref <18)

## 2020-11-01 LAB — BRAIN NATRIURETIC PEPTIDE: B Natriuretic Peptide: 1306.3 pg/mL — ABNORMAL HIGH (ref 0.0–100.0)

## 2020-11-01 LAB — RETICULOCYTES
Immature Retic Fract: 15.6 % (ref 2.3–15.9)
RBC.: 2.93 MIL/uL — ABNORMAL LOW (ref 3.87–5.11)
Retic Count, Absolute: 82.9 10*3/uL (ref 19.0–186.0)
Retic Ct Pct: 2.8 % (ref 0.4–3.1)

## 2020-11-01 IMAGING — CR DG ABDOMEN 2V
3 series · 3 of 3 positions shown · non-contrast
Comparison: [DATE]

CLINICAL DATA: Constipation

EXAM:
ABDOMEN - 2 VIEW

[abdomen erect]
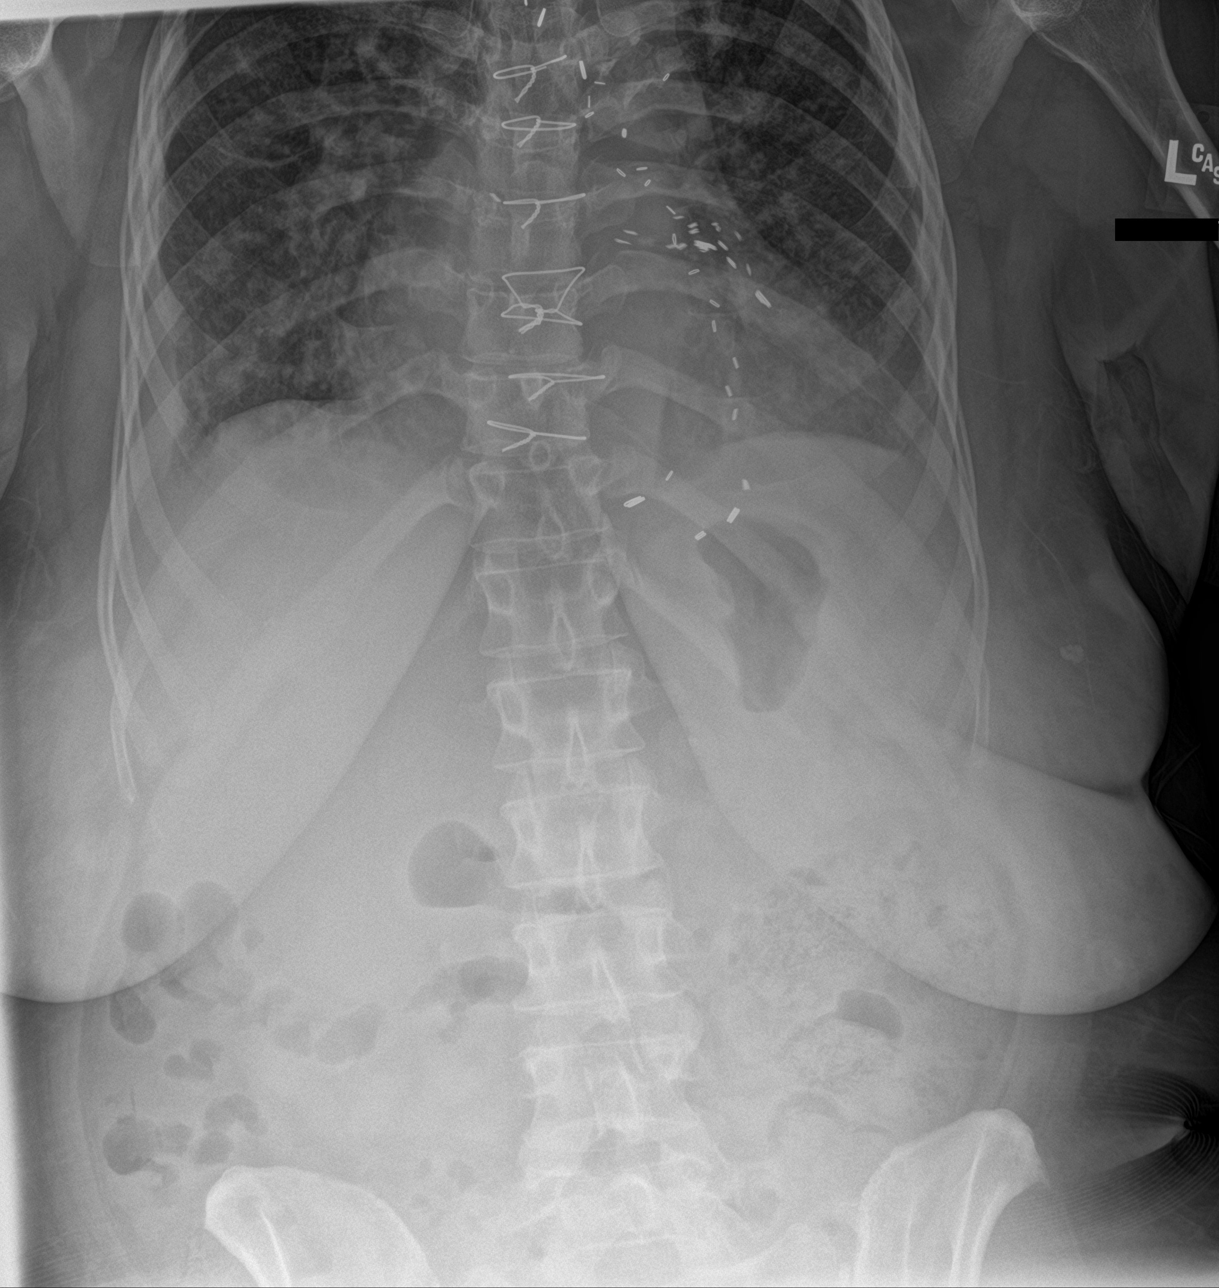

[abdomen supine (1 of 2)]
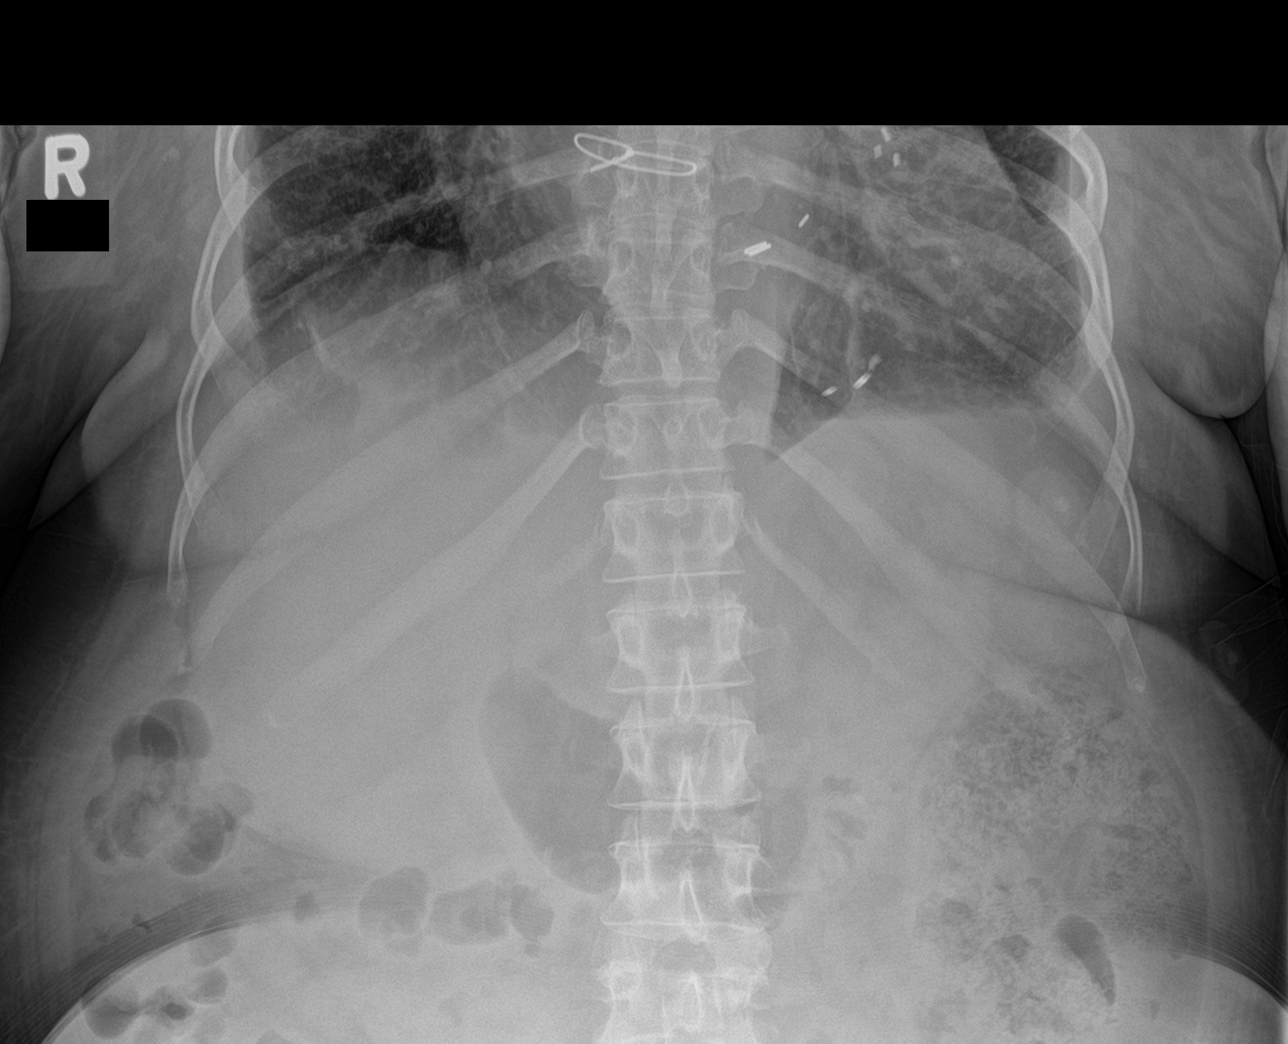

[abdomen supine (2 of 2)]
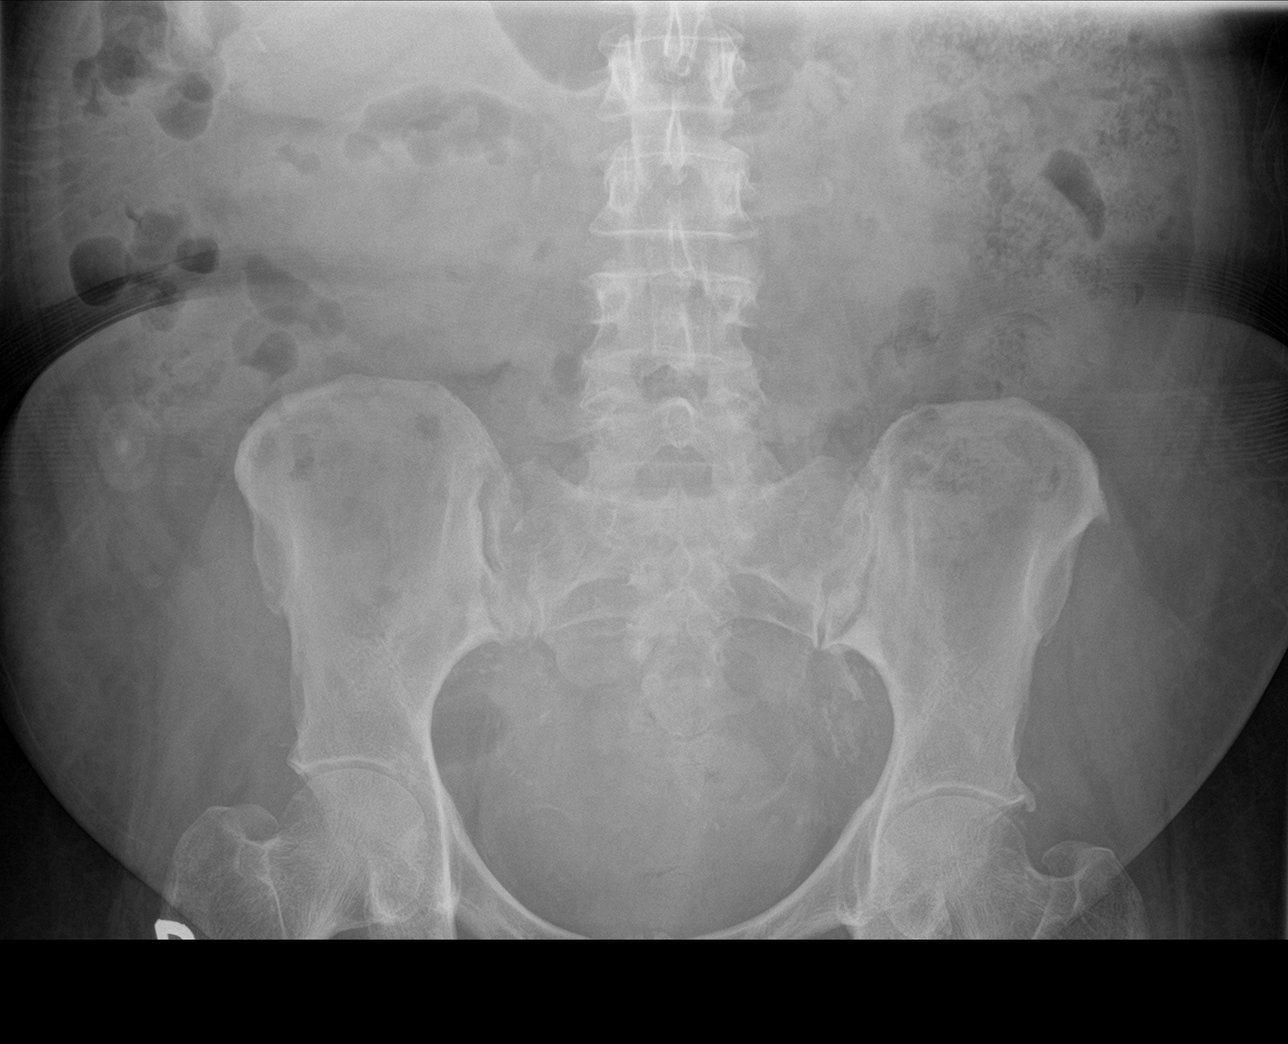

[3 of 3 positions shown; findings below may reference images not displayed]

FINDINGS: Air and stool-filled nondilated loops of bowel. Moderate colonic
stool burden predominately in the LEFT colon. Surgical clips project
over the heart. Status post median sternotomy. Small bilateral
pleural effusions. Vascular calcifications. Chondrocalcinosis of the
pubic symphysis.
IMPRESSION: Nonobstructive bowel gas pattern. Moderate colonic stool burden,
predominately in the LEFT colon.

## 2020-11-01 IMAGING — CR DG CHEST 2V
1 series · 2 of 2 positions shown · non-contrast
Comparison: [DATE]

CLINICAL DATA: Chest pain and shortness of breath

EXAM:
CHEST - 2 VIEW

[Series 1: dg chest 2 view · 0.14mm/px · 2 of 2 slices shown]
[im 1/2]
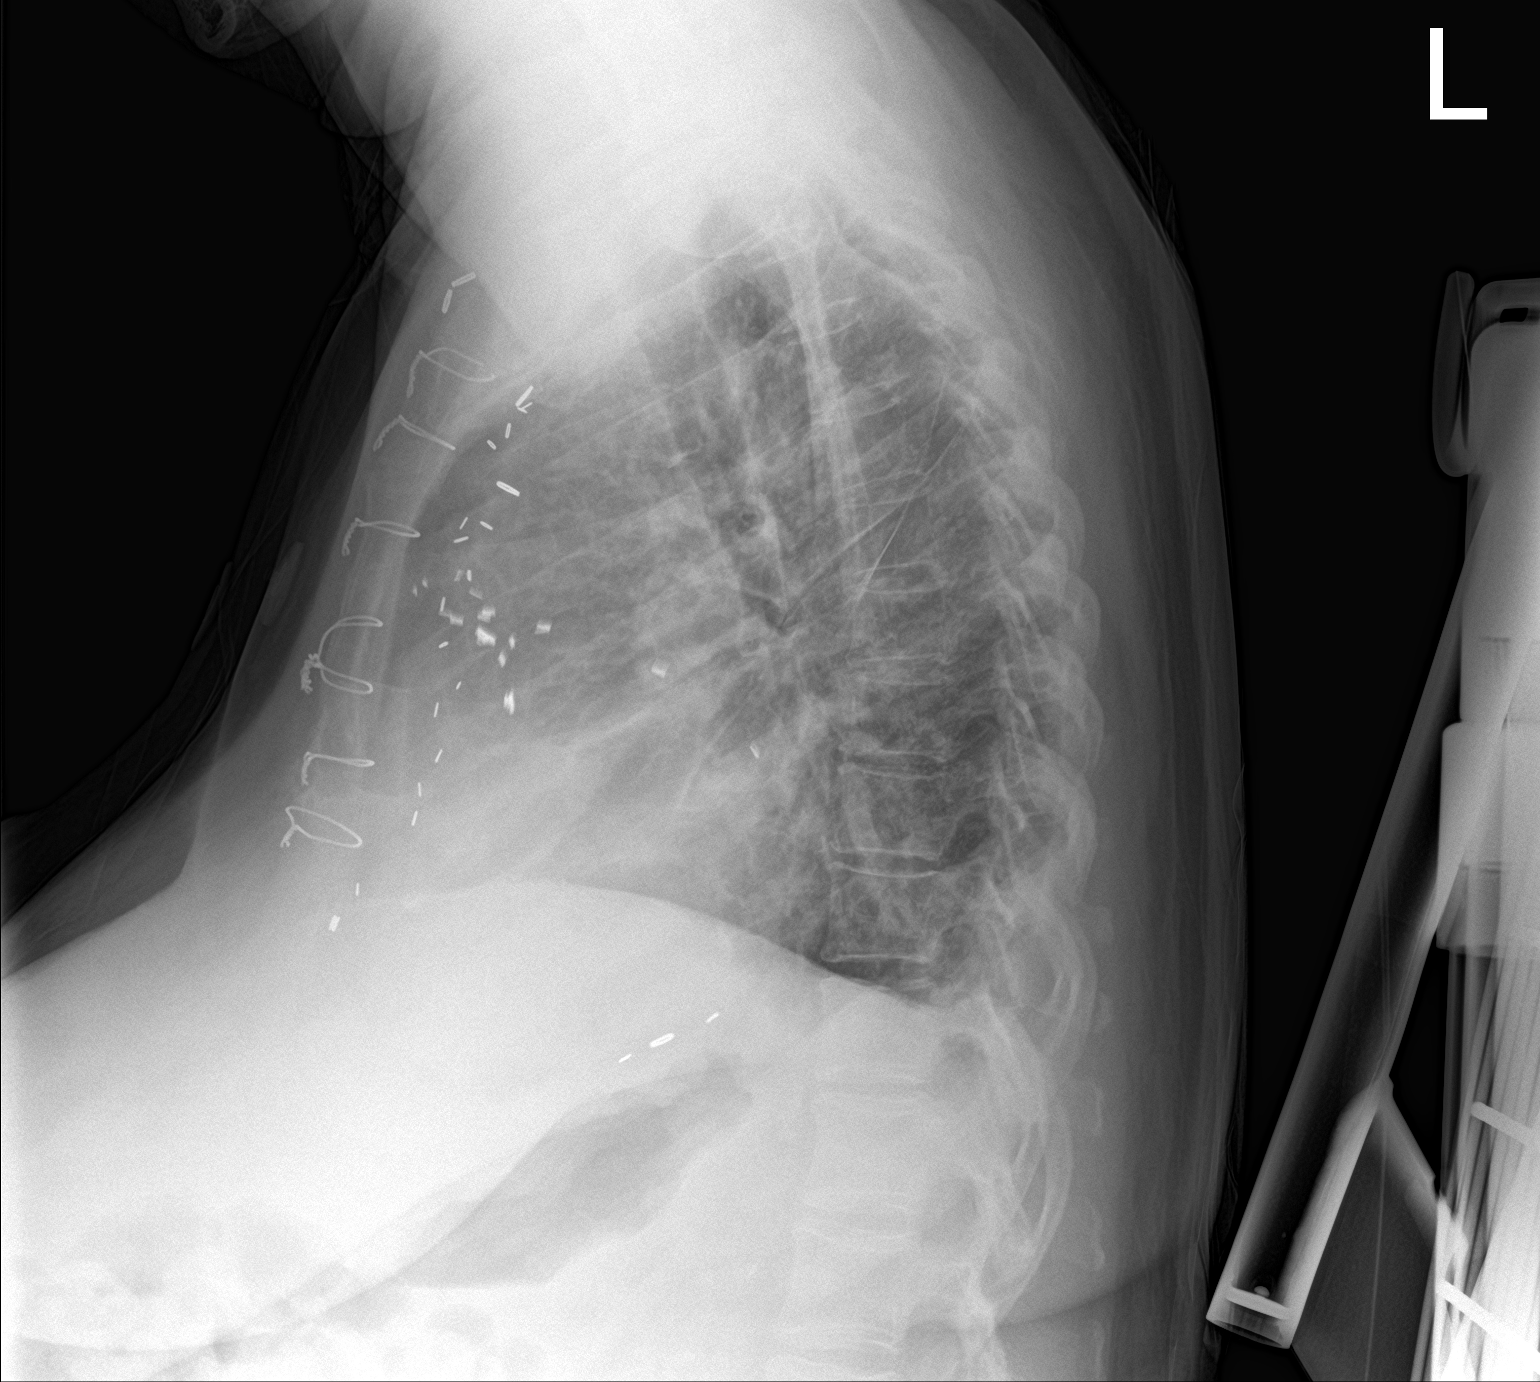
[im 2/2]
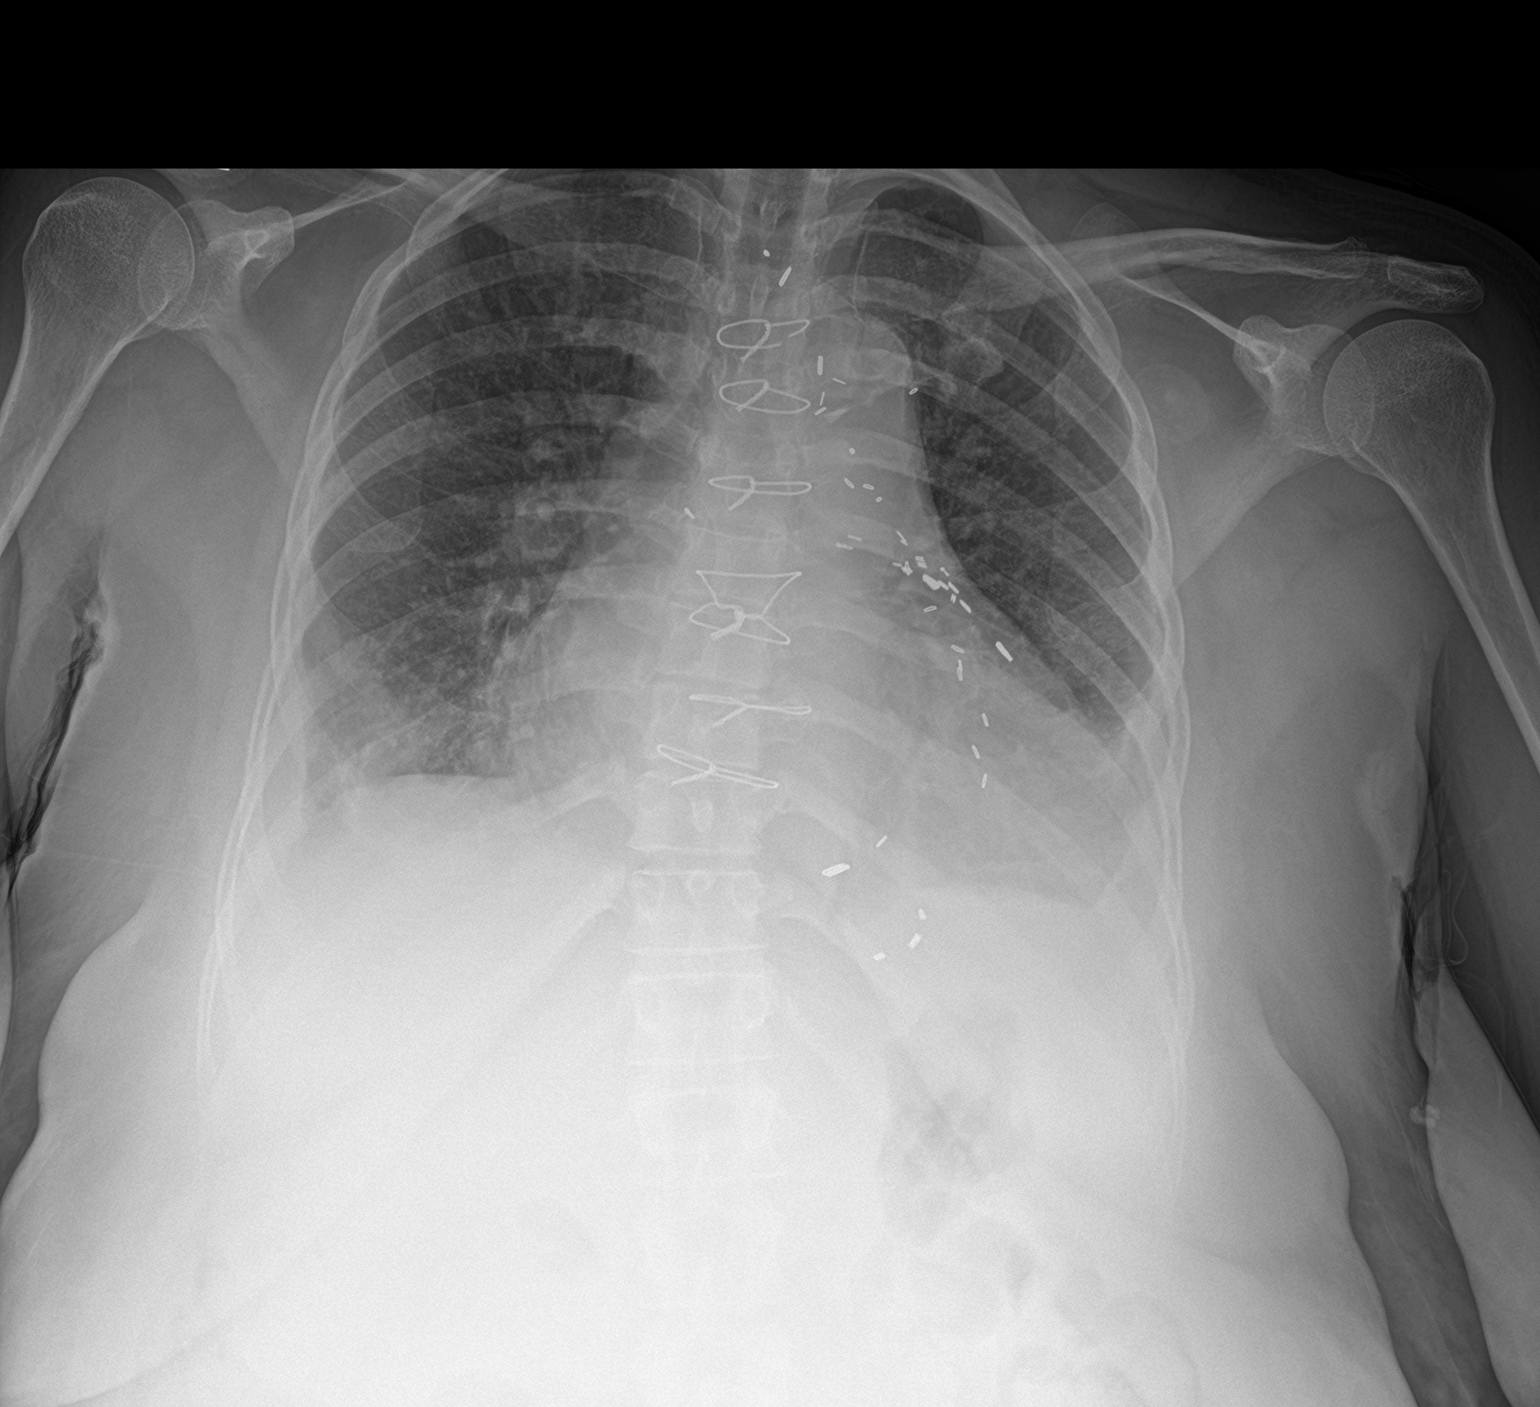

[2 of 2 positions shown; findings below may reference images not displayed]

FINDINGS: There is mild bibasilar atelectasis. There is no edema or airspace
opacity. Heart is mildly enlarged with pulmonary vascularity normal.
Patient is status post internal mammary bypass grafting. No
adenopathy. No bone lesions. No pneumothorax.
IMPRESSION: Cardiomegaly with pulmonary vascularity normal. Postoperative
changes. Mild bibasilar atelectasis. No edema or airspace opacity.

## 2020-11-01 MED ORDER — FENOFIBRATE 160 MG PO TABS
160.0000 mg | ORAL_TABLET | Freq: Every day | ORAL | Status: DC
  Administered 2020-11-02 – 2020-11-12 (×10): 160 mg via ORAL
  Filled 2020-11-01 (×11): qty 1

## 2020-11-01 MED ORDER — HEPARIN SODIUM (PORCINE) 5000 UNIT/ML IJ SOLN
5000.0000 [IU] | Freq: Three times a day (TID) | INTRAMUSCULAR | Status: DC
  Administered 2020-11-01 – 2020-11-12 (×30): 5000 [IU] via SUBCUTANEOUS
  Filled 2020-11-01 (×31): qty 1

## 2020-11-01 MED ORDER — TORSEMIDE 20 MG PO TABS
10.0000 mg | ORAL_TABLET | Freq: Every day | ORAL | Status: DC
  Administered 2020-11-02 (×2): 10 mg via ORAL
  Filled 2020-11-01 (×4): qty 1

## 2020-11-01 MED ORDER — SODIUM CHLORIDE 0.9 % IV BOLUS
1000.0000 mL | Freq: Once | INTRAVENOUS | Status: AC
  Administered 2020-11-01: 1000 mL via INTRAVENOUS

## 2020-11-01 MED ORDER — INSULIN ASPART 100 UNIT/ML ~~LOC~~ SOLN
0.0000 [IU] | Freq: Three times a day (TID) | SUBCUTANEOUS | Status: DC
  Administered 2020-11-02: 5 [IU] via SUBCUTANEOUS
  Administered 2020-11-02: 8 [IU] via SUBCUTANEOUS
  Administered 2020-11-02 – 2020-11-03 (×2): 11 [IU] via SUBCUTANEOUS
  Filled 2020-11-01 (×4): qty 1

## 2020-11-01 MED ORDER — INSULIN ASPART 100 UNIT/ML ~~LOC~~ SOLN
10.0000 [IU] | Freq: Once | SUBCUTANEOUS | Status: AC
  Administered 2020-11-01: 10 [IU] via INTRAVENOUS
  Filled 2020-11-01: qty 1

## 2020-11-01 MED ORDER — ASPIRIN EC 81 MG PO TBEC
81.0000 mg | DELAYED_RELEASE_TABLET | Freq: Every day | ORAL | Status: DC
  Administered 2020-11-01 – 2020-11-12 (×11): 81 mg via ORAL
  Filled 2020-11-01 (×12): qty 1

## 2020-11-01 MED ORDER — MORPHINE SULFATE (PF) 4 MG/ML IV SOLN
4.0000 mg | Freq: Once | INTRAVENOUS | Status: AC
  Administered 2020-11-01: 4 mg via INTRAVENOUS
  Filled 2020-11-01: qty 1

## 2020-11-01 MED ORDER — SODIUM CHLORIDE 0.9 % IV SOLN
250.0000 mL | INTRAVENOUS | Status: DC | PRN
  Administered 2020-11-03 – 2020-11-07 (×3): 250 mL via INTRAVENOUS

## 2020-11-01 MED ORDER — ONDANSETRON HCL 4 MG/2ML IJ SOLN
4.0000 mg | Freq: Once | INTRAMUSCULAR | Status: AC
  Administered 2020-11-01: 4 mg via INTRAVENOUS
  Filled 2020-11-01: qty 2

## 2020-11-01 MED ORDER — SODIUM BICARBONATE 650 MG PO TABS
1300.0000 mg | ORAL_TABLET | Freq: Three times a day (TID) | ORAL | Status: DC
  Administered 2020-11-01 – 2020-11-05 (×11): 1300 mg via ORAL
  Filled 2020-11-01 (×12): qty 2

## 2020-11-01 MED ORDER — POTASSIUM CHLORIDE CRYS ER 20 MEQ PO TBCR
20.0000 meq | EXTENDED_RELEASE_TABLET | Freq: Every day | ORAL | Status: DC
  Filled 2020-11-01: qty 1

## 2020-11-01 MED ORDER — ATORVASTATIN CALCIUM 80 MG PO TABS
80.0000 mg | ORAL_TABLET | Freq: Every day | ORAL | Status: DC
  Administered 2020-11-02 – 2020-11-12 (×10): 80 mg via ORAL
  Filled 2020-11-01 (×10): qty 1

## 2020-11-01 MED ORDER — SODIUM CHLORIDE 0.9% FLUSH
3.0000 mL | Freq: Two times a day (BID) | INTRAVENOUS | Status: DC
  Administered 2020-11-01 – 2020-11-12 (×19): 3 mL via INTRAVENOUS

## 2020-11-01 MED ORDER — TORSEMIDE 20 MG PO TABS: 20.0000 mg | ORAL_TABLET | Freq: Every day | ORAL | Status: DC

## 2020-11-01 MED ORDER — OXYCODONE HCL 5 MG PO TABS
5.0000 mg | ORAL_TABLET | Freq: Once | ORAL | Status: AC
  Administered 2020-11-01: 5 mg via ORAL
  Filled 2020-11-01: qty 1

## 2020-11-01 MED ORDER — PREDNISONE 20 MG PO TABS
20.0000 mg | ORAL_TABLET | Freq: Every day | ORAL | Status: DC
  Administered 2020-11-02 – 2020-11-12 (×11): 20 mg via ORAL
  Filled 2020-11-01 (×12): qty 1

## 2020-11-01 MED ORDER — MOMETASONE FURO-FORMOTEROL FUM 200-5 MCG/ACT IN AERO
2.0000 | INHALATION_SPRAY | Freq: Two times a day (BID) | RESPIRATORY_TRACT | Status: DC
  Administered 2020-11-02 – 2020-11-12 (×20): 2 via RESPIRATORY_TRACT
  Filled 2020-11-01: qty 8.8

## 2020-11-01 MED ORDER — FAMOTIDINE 20 MG PO TABS
10.0000 mg | ORAL_TABLET | Freq: Every day | ORAL | Status: DC
  Administered 2020-11-02 – 2020-11-12 (×10): 10 mg via ORAL
  Filled 2020-11-01 (×10): qty 1

## 2020-11-01 MED ORDER — CARVEDILOL 25 MG PO TABS
25.0000 mg | ORAL_TABLET | Freq: Two times a day (BID) | ORAL | Status: DC
  Administered 2020-11-02 – 2020-11-12 (×19): 25 mg via ORAL
  Filled 2020-11-01 (×19): qty 1

## 2020-11-01 MED ORDER — POTASSIUM CHLORIDE CRYS ER 20 MEQ PO TBCR
20.0000 meq | EXTENDED_RELEASE_TABLET | Freq: Once | ORAL | Status: AC
  Administered 2020-11-01: 20 meq via ORAL
  Filled 2020-11-01: qty 1

## 2020-11-01 MED ORDER — AMLODIPINE BESYLATE 10 MG PO TABS
10.0000 mg | ORAL_TABLET | Freq: Every day | ORAL | Status: DC
  Administered 2020-11-01 – 2020-11-12 (×11): 10 mg via ORAL
  Filled 2020-11-01 (×6): qty 1
  Filled 2020-11-01: qty 2
  Filled 2020-11-01 (×4): qty 1

## 2020-11-01 MED ORDER — TOPIRAMATE 25 MG PO TABS
25.0000 mg | ORAL_TABLET | Freq: Every day | ORAL | Status: DC
  Administered 2020-11-02 – 2020-11-12 (×11): 25 mg via ORAL
  Filled 2020-11-01 (×11): qty 1

## 2020-11-01 MED ORDER — ISOSORBIDE MONONITRATE ER 30 MG PO TB24
30.0000 mg | ORAL_TABLET | Freq: Every day | ORAL | Status: DC
  Administered 2020-11-01 – 2020-11-12 (×11): 30 mg via ORAL
  Filled 2020-11-01 (×11): qty 1

## 2020-11-01 MED ORDER — POTASSIUM CHLORIDE CRYS ER 20 MEQ PO TBCR
20.0000 meq | EXTENDED_RELEASE_TABLET | Freq: Every day | ORAL | Status: DC
Start: 2020-11-02 — End: 2020-11-01

## 2020-11-01 MED ORDER — SODIUM CHLORIDE 0.9% FLUSH: 3.0000 mL | INTRAVENOUS | Status: DC | PRN

## 2020-11-01 NOTE — ED Provider Notes (Signed)
[facility] Emergency Department Provider Note  ____________________________________________   First MD Initiated Contact with Patient 11/01/20 1604     (approximate)  I have reviewed the triage vital signs and the nursing notes.   HISTORY  Chief Complaint Shortness of Breath and Back Pain   HPI Terry Arroyo is a 59 y.o. female with a history of CHF, DM, hypertension, MI, and CVA presents to the emergency department for treatment and evaluation of back pain, chest pain, abdominal pain and lower extremity swelling. She was released from here on 10/28/20. She states that she has felt bad since she got home. She states that she has been taking her medications as prescribed but doesn't feel any better.  Past Medical History:  Diagnosis Date  . Blind left eye   . Brain tumor ([place]) 1986  . CHF (congestive heart failure) ([place])   . Diabetes mellitus without complication ([place])   . Hyperlipidemia   . Hypertension   . MI (myocardial infarction) ([place]) 2018  . Stroke [facility])    stated affected left side.    Patient Active Problem List   Diagnosis Date Noted  . SOB (shortness of breath) 11/01/2020  . Acute on chronic systolic congestive heart failure ([place]) 10/21/2020  . CAD (coronary artery disease) 10/21/2020  . Hyperlipidemia associated with type 2 diabetes mellitus ([place]) 10/21/2020  . HFrEF (heart failure with reduced ejection fraction) ([place])   . Acute respiratory distress   . COPD exacerbation ([place]) 10/10/2020  . Kidney hematoma 09/13/2020  . Hypertension associated with diabetes ([place])   . AKI (acute kidney injury) ([place])   . Hematuria 09/12/2020  . Calf tenderness   . Acute on chronic heart failure ([place]) 02/27/2020  . Leg edema   . DM2 (diabetes mellitus, type 2) ([place]) 02/26/2020  . CKD (chronic kidney disease) stage 4, GFR 15-29 ml/min (HCC) 02/26/2020  . Hyponatremia 02/26/2020  . Anasarca 02/26/2020  . Proteinuria 02/26/2020  . Hypoalbuminemia  02/26/2020  . Elevated troponin 02/26/2020  . Acute on chronic systolic CHF (congestive heart failure) ([place]) 02/26/2020  . Hyperglycemia   . Nausea   . Hypertensive emergency   . Hypertensive urgency 11/24/2019  . Chest pain 11/23/2019  . OSA (obstructive sleep apnea) 04/10/2019    Past Surgical History:  Procedure Laterality Date  . BRAIN SURGERY    . BREAST EXCISIONAL BIOPSY Left yrs ago   benign  . CESAREAN SECTION      Prior to Admission medications   Medication Sig Start Date End Date Taking? Authorizing Provider  albuterol (VENTOLIN HFA) 108 (90 Base) MCG/ACT inhaler Inhale 1-2 puffs into the lungs every 6 (six) hours as needed for wheezing or shortness of breath.    [provider]  amLODipine (NORVASC) 10 MG tablet Take 1 tablet (10 mg total) by mouth daily. 03/03/20   Lorella Nimrod, MD  ammonium lactate (LAC-HYDRIN) 12 % lotion Apply 1 application topically as needed for dry skin.    [provider]  aspirin 81 MG EC tablet Take 1 tablet (81 mg total) by mouth daily. Do not take this medication for 1 week and then restart. 09/15/20   Wyvonnia Dusky, MD  atorvastatin (LIPITOR) 80 MG tablet Take 1 tablet (80 mg total) by mouth daily at 6 PM. 11/26/19   Lavina Hamman, MD  carvedilol (COREG) 25 MG tablet Take 25 mg by mouth 2 (two) times daily with a meal.     [provider]  cyanocobalamin 1000  MCG tablet Take 1,000 mcg by mouth daily.    [provider]  cyclobenzaprine (FLEXERIL) 5 MG tablet Take 5 mg by mouth 2 (two) times daily as needed for muscle spasms. 09/01/20   [provider]  fenofibrate (TRICOR) 145 MG tablet Take 145 mg by mouth daily.    [provider]  GLYCERIN-HYPROMELLOSE-PEG 400 OP Place 1 drop into both eyes 4 (four) times daily.    [provider]  insulin aspart protamine- aspart (NOVOLOG MIX 70/30) (70-30) 100 UNIT/ML injection Inject 0.4 mLs (40 Units total) into the skin 2 (two) times  daily with a meal. Patient taking differently: Inject 45 Units into the skin 2 (two) times daily with a meal.  11/26/19   Lavina Hamman, MD  isosorbide mononitrate (IMDUR) 30 MG 24 hr tablet Take 30 mg by mouth daily.    [provider]  mirtazapine (REMERON) 30 MG tablet Take 1 tablet (30 mg total) by mouth at bedtime. 11/26/19   Lavina Hamman, MD  mometasone-formoterol [facility]) 200-5 MCG/ACT AERO Inhale 2 puffs into the lungs 2 (two) times daily.    [provider]  omeprazole (PRILOSEC) 20 MG capsule Take 20 mg by mouth daily.  09/22/20   [provider]  prednisoLONE acetate (PRED FORTE) 1 % ophthalmic suspension Place 1 drop into the right eye 4 (four) times daily. 08/22/20   [provider]  predniSONE (DELTASONE) 20 MG tablet Take 1 tablet (20 mg total) by mouth daily with breakfast for 14 days. 10/29/20 11/12/20  Wyvonnia Dusky, MD  sodium bicarbonate 650 MG tablet Take 2 tablets (1,300 mg total) by mouth 3 (three) times daily. 10/12/20   Lorella Nimrod, MD  topiramate (TOPAMAX) 25 MG tablet Take 25 mg by mouth daily. 08/19/20   [provider]  torsemide (DEMADEX) 10 MG tablet Take 2 tablets (20 mg total) by mouth daily. 10/12/20   Lorella Nimrod, MD    Allergies Hydralazine, Shellfish allergy, Kiwi extract, Metformin, and Tape  Family History  Problem Relation Age of Onset  . CAD Mother   . CAD Brother   . Breast cancer Sister 100    Social History Social History   Tobacco Use  . Smoking status: Former Research scientist (life sciences)  . Smokeless tobacco: Never Used  Vaping Use  . Vaping Use: Never used  Substance Use Topics  . Alcohol use: Not Currently  . Drug use: Not Currently    Review of Systems  Constitutional: No fever/chills. Eyes: No visual changes. ENT: No sore throat. Cardiovascular: Positive for chest pain. Negative for pleuritic pain. Negative for palpitations. Positive for leg pain. Respiratory: Negative for shortness of  breath. Gastrointestinal: positive for abdominal pain. Negative for nausea, no vomiting.  No diarrhea.  No constipation. Genitourinary: Negative for dysuria. Musculoskeletal: Positive for back pain.  Skin: Negative for rash, lesion, wound. Neurological: Negative for headaches, focal weakness or numbness. ____________________________________________   PHYSICAL EXAM:  VITAL SIGNS: ED Triage Vitals [11/01/20 1512]  Enc Vitals Group     BP (!) 171/74     Pulse Rate 72     Resp (!) 22     Temp 98.5 F (36.9 C)     Temp Source Oral     SpO2 97 %     Weight 194 lb 0.1 oz (88 kg)     Height 5\' 6"  (1.676 m)     Head Circumference      Peak Flow      Pain Score 7  Pain Loc      Pain Edu?      Excl. in [place]?     Constitutional: Alert and oriented. Chronically ill appearing and in no acute distress. Normal mental status. Eyes: Conjunctivae are normal. PERRL. Head: Atraumatic. Nose: No congestion/rhinnorhea. Mouth/Throat: Mucous membranes are moist.  Oropharynx non-erythematous. Tongue normal in size and color. Neck: No stridor. No carotid bruit appreciated on exam. Hematological/Lymphatic/Immunilogical: No cervical lymphadenopathy. Cardiovascular: Normal rate, regular rhythm. Grossly normal heart sounds.  Good peripheral circulation. Respiratory: Normal respiratory effort.  No retractions. Lungs CTAB. Gastrointestinal: Soft and nontender. No distention. No abdominal bruits. No CVA tenderness. Genitourinary: Exam deferred. Musculoskeletal: No lower extremity tenderness. 1+ pitting edema of extremities. Neurologic:  Normal speech and language. No gross focal neurologic deficits are appreciated. Skin:  Skin is warm, dry and intact. No rash noted. Psychiatric: Mood and affect are normal. Speech and behavior are normal.  ____________________________________________   LABS (all labs ordered are listed, but only abnormal results are displayed)  Labs Reviewed  BASIC METABOLIC PANEL  - Abnormal; Notable for the following components:      Result Value   Sodium 134 (*)    Potassium 3.0 (*)    Glucose, Bld 520 (*)    BUN 60 (*)    Creatinine, Ser 3.30 (*)    Calcium 8.0 (*)    GFR, Estimated 16 (*)    All other components within normal limits  CBC - Abnormal; Notable for the following components:   WBC 11.7 (*)    RBC 3.13 (*)    Hemoglobin 9.1 (*)    HCT 28.0 (*)    Platelets 469 (*)    All other components within normal limits  BLOOD GAS, VENOUS - Abnormal; Notable for the following components:   pO2, Ven <31.0 (*)    All other components within normal limits  BRAIN NATRIURETIC PEPTIDE - Abnormal; Notable for the following components:   B Natriuretic Peptide 1,306.3 (*)    All other components within normal limits  CBG MONITORING, ED - Abnormal; Notable for the following components:   Glucose-Capillary 391 (*)    All other components within normal limits  TROPONIN I (HIGH SENSITIVITY) - Abnormal; Notable for the following components:   Troponin I (High Sensitivity) 110 (*)    All other components within normal limits  TROPONIN I (HIGH SENSITIVITY) - Abnormal; Notable for the following components:   Troponin I (High Sensitivity) 99 (*)    All other components within normal limits  RESPIRATORY PANEL BY RT PCR (FLU A&B, COVID)  CBG MONITORING, ED  CBG MONITORING, ED  CBG MONITORING, ED  CBG MONITORING, ED  CBG MONITORING, ED  CBG MONITORING, ED  CBG MONITORING, ED  CBG MONITORING, ED  CBG MONITORING, ED  CBG MONITORING, ED  CBG MONITORING, ED  CBG MONITORING, ED  CBG MONITORING, ED  CBG MONITORING, ED  CBG MONITORING, ED  CBG MONITORING, ED   ____________________________________________  EKG  ED ECG REPORT I, Cary Lothrop, FNP-BC personally viewed and interpreted this ECG.   Date: 11/01/2020  EKG Time: 1637  Rate: 72  Rhythm: unchanged from previous tracings, normal sinus rhythm  Axis: normal axis  Intervals:none  ST&T Change: no ST  elevation  ____________________________________________  RADIOLOGY  ED MD interpretation: Chest x-ray negative for acute cardiopulmonary abnormality.  I, Sherrie George, personally viewed and evaluated these images (plain radiographs) as part of my medical decision making, as well as reviewing the written report by the radiologist.  Official  radiology report(s): DG Chest 2 View  Result Date: 11/01/2020 CLINICAL DATA:  Chest pain and shortness of breath EXAM: CHEST - 2 VIEW COMPARISON:  October 21, 2020 FINDINGS: There is mild bibasilar atelectasis. There is no edema or airspace opacity. Heart is mildly enlarged with pulmonary vascularity normal. Patient is status post internal mammary bypass grafting. No adenopathy. No bone lesions. No pneumothorax. IMPRESSION: Cardiomegaly with pulmonary vascularity normal. Postoperative changes. Mild bibasilar atelectasis. No edema or airspace opacity. Electronically Signed   By: Lowella Grip III M.D.   On: 11/01/2020 15:51   DG Abd 2 Views  Result Date: 11/01/2020 CLINICAL DATA:  Constipation EXAM: ABDOMEN - 2 VIEW COMPARISON:  September 12, 2020 FINDINGS: Air and stool-filled nondilated loops of bowel. Moderate colonic stool burden predominately in the LEFT colon. Surgical clips project over the heart. Status post median sternotomy. Small bilateral pleural effusions. Vascular calcifications. Chondrocalcinosis of the pubic symphysis. IMPRESSION: Nonobstructive bowel gas pattern. Moderate colonic stool burden, predominately in the LEFT colon. Electronically Signed   By: Valentino Saxon MD   On: 11/01/2020 17:58    ____________________________________________   PROCEDURES  Procedure(s) performed: None  Procedures  IV inserted by me.  22-gauge in the right hand.  Critical Care performed: Yes, see critical care note(s)  ____________________________________________   INITIAL IMPRESSION / ASSESSMENT AND PLAN   59 year old female presenting  to the emergency department for treatment and evaluation of symptoms as described in the HPI.  Protocol labs and EKG completed while awaiting ER room assignment.  Awaiting results.   Troponin is noted to be elevated at 110 and glucose is 533.  Orders for fluids placed however she is a difficult IV start.   Upon reviewing her chart, she does have history of an elevated troponin this year however only elevated to 46.  During her recent admission in early November, she complained of thoracic and lumbar pain and CT of both was performed without any acute findings. No recent cardiology note found.  IV was dislodged accidentally.  IV team consulted.  Awaiting repeat troponin and repeat CBG.  Only 200 mL of fluid have been infused prior to IV infiltrating.  Patient is awake, alert, oriented.  She states that she had requested a home health service when she was discharged home. Social worker visited once and hasn't been back. She lives alone.   ----------------------------------------- 7:18 PM on 11/01/2020 -----------------------------------------  Second troponin drawn and sent by RN. IV in left upper arm now infusing NS. Plan will be to repeat glucose at around 8:00pm. No chest pain at this time. Patient complaining of back pain. Will order medication.  As part of my medical decision making, I reviewed the following data within the [facility] EKG reviewed, Old chart reviewed, Discussed with admitting physician and Notes from prior ED visits  ----------------------------------------- 9:19 PM on 11/01/2020 -----------------------------------------  Patient to be admitted under hospitalist service. ____________________________________________  Differential diagnosis includes, but not limited to:   ED COURSE  Clinical Course as of Nov 01 2118  Sat Nov 01, 2020  1736 Troponin I (High Sensitivity)(!!) [CT]  2007 Second troponin down to 99. Patient not currently complaining of  chest pain.  Troponin I (High Sensitivity)(!) [CT]    Clinical Course User Index [CT] Lottie Siska B, FNP    FINAL CLINICAL IMPRESSION(S) / ED DIAGNOSES  Final diagnoses:  Hyperglycemia  Elevated troponin  Renal failure, unspecified chronicity     ED Discharge Orders    None  Terry Arroyo was evaluated in Emergency Department on 11/01/2020 for the symptoms described in the history of present illness. She was evaluated in the context of the global COVID-19 pandemic, which necessitated consideration that the patient might be at risk for infection with the SARS-CoV-2 virus that causes COVID-19. Institutional protocols and algorithms that pertain to the evaluation of patients at risk for COVID-19 are in a state of rapid change based on information released by regulatory bodies including the CDC and federal and state organizations. These policies and algorithms were followed during the patient's care in the ED.   Note:  This document was prepared using Dragon voice recognition software and may include unintentional dictation errors.   Victorino Dike, FNP 11/01/20 2119    Nena Polio, MD 11/02/20 [phone]

## 2020-11-01 NOTE — ED Notes (Signed)
Date and time results received: 11/01/20 2038   Test: Troponin  Critical Value: 69  Name of Provider Notified: NP Triplett  Orders Received? Or Actions Taken?: No new orders at this time

## 2020-11-01 NOTE — ED Notes (Signed)
IV team at bedside 

## 2020-11-01 NOTE — ED Notes (Signed)
Pt up to use bathroom 

## 2020-11-01 NOTE — Assessment & Plan Note (Signed)
a1c is high at 8.6 about 3 weeks ago when she was in hospital.  Cont asa 81/ atorvastatin 80 mg for dm related dyslipidemia. Ssi/ accuchecks/ glycemic protocol.

## 2020-11-01 NOTE — ED Notes (Signed)
Lab called to collect blood work.

## 2020-11-01 NOTE — H&P (Addendum)
History and Physical    Terry Arroyo QAS:[id number] DOB: 05/01/1961 DOA: 11/01/2020  PCP: Kirk Ruths, MD    Patient coming from:  Home   Chief Complaint:  Chest pain sob.   HPI: Terry Arroyo is a 59 y.o. female with medical history significant of dm ii, htn, cva, cad seen in ed for sob and le edema. Chart review has march echo which shows moderately decreased LV function with ef of 30%, different from 55-60 in 11/2019. She was recently discharged in October 2021 for similar presentation  and is returning to [place] for not feeling well with generalized complaints of chest pain sob and le edema..  I do not see  cardiology consult note pt, reports she does see cardiologist in addition to nephrologist as well.. Pt lives alone. Pt is retired, pt sees a Production designer, theatre/television/film. Pt states that she does not think she wants snf options but needs someone at home for cleaning and cooking etc.[facility] was started on her case last admission but pt was on wait list.  ED Course:  Vitals:   11/01/20 2100 11/01/20 2130 11/01/20 2200 11/01/20 2240  BP: (!) 188/86 100/63 (!) 147/106 (!) 164/111  Pulse: 64 63 60   Resp: 19 19    Temp:      TempSrc:      SpO2: 93% 97% 97%   Weight:      Height:        Review of Systems:  Review of Systems  Constitutional: Negative.   HENT: Negative.   Eyes: Negative.   Cardiovascular: Positive for chest pain and leg swelling.  Gastrointestinal: Positive for constipation.  Genitourinary: Negative.   Musculoskeletal: Positive for back pain.  Skin: Negative.   Neurological: Negative.   Endo/Heme/Allergies: Negative.   Psychiatric/Behavioral: Negative.   All other systems reviewed and are negative.  Past Medical History:  Diagnosis Date  . Blind left eye   . Brain tumor ([place]) 1986  . CHF (congestive heart failure) ([place])   . Diabetes mellitus without complication ([place])   . Hyperlipidemia   . Hypertension   . MI (myocardial infarction) ([place]) 2018    . Stroke [facility])    stated affected left side.    Past Surgical History:  Procedure Laterality Date  . BRAIN SURGERY    . BREAST EXCISIONAL BIOPSY Left yrs ago   benign  . CESAREAN SECTION       reports that she has quit smoking. She has never used smokeless tobacco. She reports previous alcohol use. She reports previous drug use.  Allergies  Allergen Reactions  . Hydralazine Shortness Of Breath and Swelling    Body aches  . Shellfish Allergy Anaphylaxis    Shrimp/lobster  Shrimp/lobster    . Kiwi Extract Swelling  . Metformin Other (See Comments)    Other reaction(s): GI Intolerance  . Tape Itching    Use paper tape whenever possible    Family History  Problem Relation Age of Onset  . CAD Mother   . CAD Brother   . Breast cancer Sister 51    Prior to Admission medications   Medication Sig Start Date End Date Taking? Authorizing Provider  albuterol (VENTOLIN HFA) 108 (90 Base) MCG/ACT inhaler Inhale 1-2 puffs into the lungs every 6 (six) hours as needed for wheezing or shortness of breath.    [provider]  amLODipine (NORVASC) 10 MG tablet Take 1 tablet (10 mg total) by mouth daily. 03/03/20   Lorella Nimrod, MD  ammonium lactate (LAC-HYDRIN) 12 % lotion Apply 1 application topically as needed for dry skin.    [provider]  aspirin 81 MG EC tablet Take 1 tablet (81 mg total) by mouth daily. Do not take this medication for 1 week and then restart. 09/15/20   Wyvonnia Dusky, MD  atorvastatin (LIPITOR) 80 MG tablet Take 1 tablet (80 mg total) by mouth daily at 6 PM. 11/26/19   Lavina Hamman, MD  carvedilol (COREG) 25 MG tablet Take 25 mg by mouth 2 (two) times daily with a meal.     [provider]  cyanocobalamin 1000 MCG tablet Take 1,000 mcg by mouth daily.    [provider]  cyclobenzaprine (FLEXERIL) 5 MG tablet Take 5 mg by mouth 2 (two) times daily as needed for muscle spasms. 09/01/20   [provider]   fenofibrate (TRICOR) 145 MG tablet Take 145 mg by mouth daily.    [provider]  GLYCERIN-HYPROMELLOSE-PEG 400 OP Place 1 drop into both eyes 4 (four) times daily.    [provider]  insulin aspart protamine- aspart (NOVOLOG MIX 70/30) (70-30) 100 UNIT/ML injection Inject 0.4 mLs (40 Units total) into the skin 2 (two) times daily with a meal. Patient taking differently: Inject 45 Units into the skin 2 (two) times daily with a meal.  11/26/19   Lavina Hamman, MD  isosorbide mononitrate (IMDUR) 30 MG 24 hr tablet Take 30 mg by mouth daily.    [provider]  mirtazapine (REMERON) 30 MG tablet Take 1 tablet (30 mg total) by mouth at bedtime. 11/26/19   Lavina Hamman, MD  mometasone-formoterol [facility]) 200-5 MCG/ACT AERO Inhale 2 puffs into the lungs 2 (two) times daily.    [provider]  omeprazole (PRILOSEC) 20 MG capsule Take 20 mg by mouth daily.  09/22/20   [provider]  prednisoLONE acetate (PRED FORTE) 1 % ophthalmic suspension Place 1 drop into the right eye 4 (four) times daily. 08/22/20   [provider]  predniSONE (DELTASONE) 20 MG tablet Take 1 tablet (20 mg total) by mouth daily with breakfast for 14 days. 10/29/20 11/12/20  Wyvonnia Dusky, MD  sodium bicarbonate 650 MG tablet Take 2 tablets (1,300 mg total) by mouth 3 (three) times daily. 10/12/20   Lorella Nimrod, MD  topiramate (TOPAMAX) 25 MG tablet Take 25 mg by mouth daily. 08/19/20   [provider]  torsemide (DEMADEX) 10 MG tablet Take 2 tablets (20 mg total) by mouth daily. 10/12/20   Lorella Nimrod, MD    Physical Exam: Vitals:   11/01/20 2100 11/01/20 2130 11/01/20 2200 11/01/20 2240  BP: (!) 188/86 100/63 (!) 147/106 (!) 164/111  Pulse: 64 63 60   Resp: 19 19    Temp:      TempSrc:      SpO2: 93% 97% 97%   Weight:      Height:        Physical Exam Constitutional:      Appearance: She is obese.  HENT:     Head: Normocephalic and  atraumatic.     Right Ear: External ear normal.     Left Ear: External ear normal.     Nose: Nose normal.  Eyes:     General: Lids are normal. Vision grossly intact.     Extraocular Movements: Extraocular movements intact.     Pupils: Pupils are equal, round, and reactive to light.     Comments: BL proptosis will  get tft.  Cardiovascular:     Rate and Rhythm: Normal rate and regular rhythm.  Pulmonary:     Effort: Pulmonary effort is normal.     Breath sounds: Normal breath sounds.  Abdominal:     General: Bowel sounds are normal.     Palpations: Abdomen is soft. There is no mass.  Musculoskeletal:     Right lower leg: Edema present.     Left lower leg: Edema present.  Skin:    General: Skin is warm.  Neurological:     General: No focal deficit present.     Mental Status: She is alert and oriented to person, place, and time.  Psychiatric:        Behavior: Behavior normal.      Labs on Admission: I have personally reviewed following labs and imaging studies  CBC: Recent Labs  Lab 10/26/20 0645 10/27/20 0452 10/28/20 0404 11/01/20 1520  WBC 18.9* 13.7* 10.0 11.7*  HGB 10.1* 9.9* 8.6* 9.1*  HCT 30.4* 29.1* 26.4* 28.0*  MCV 88.9 86.9 88.3 89.5  PLT 466* 511* 486* 856*   Basic Metabolic Panel: Recent Labs  Lab 10/26/20 0645 10/27/20 0452 10/28/20 0404 11/01/20 1520  NA 132* 132* 132* 134*  K 3.7 3.5 3.4* 3.0*  CL 99 98 99 101  CO2 21* 23 24 22   GLUCOSE 372* 346* 310* 520*  BUN 63* 72* 77* 60*  CREATININE 3.95* 3.81* 3.84* 3.30*  CALCIUM 8.8* 8.7* 8.0* 8.0*   GFR: Estimated Creatinine Clearance: 20.8 mL/min (A) (by C-G formula based on SCr of 3.3 mg/dL (H)). Liver Function Tests: No results for input(s): AST, ALT, ALKPHOS, BILITOT, PROT, ALBUMIN in the last 168 hours. No results for input(s): LIPASE, AMYLASE in the last 168 hours. No results for input(s): AMMONIA in the last 168 hours. Coagulation Profile: No results for input(s): INR, PROTIME in the last  168 hours. Cardiac Enzymes: No results for input(s): CKTOTAL, CKMB, CKMBINDEX, TROPONINI in the last 168 hours. BNP (last 3 results) No results for input(s): PROBNP in the last 8760 hours. HbA1C: No results for input(s): HGBA1C in the last 72 hours. CBG: Recent Labs  Lab 10/27/20 2003 10/28/20 0750 10/28/20 1128 10/28/20 1636 11/01/20 2043  GLUCAP 173* 344* 229* 262* 391*   Lipid Profile: No results for input(s): CHOL, HDL, LDLCALC, TRIG, CHOLHDL, LDLDIRECT in the last 72 hours. Thyroid Function Tests: No results for input(s): TSH, T4TOTAL, FREET4, T3FREE, THYROIDAB in the last 72 hours. Anemia Panel: No results for input(s): VITAMINB12, FOLATE, FERRITIN, TIBC, IRON, RETICCTPCT in the last 72 hours. Urine analysis:    Component Value Date/Time   COLORURINE STRAW (A) 10/21/2020 0829   APPEARANCEUR CLEAR (A) 10/21/2020 0829   LABSPEC 1.007 10/21/2020 0829   PHURINE 7.0 10/21/2020 0829   GLUCOSEU 150 (A) 10/21/2020 0829   HGBUR NEGATIVE 10/21/2020 0829   BILIRUBINUR NEGATIVE 10/21/2020 0829   KETONESUR NEGATIVE 10/21/2020 0829   PROTEINUR >=300 (A) 10/21/2020 0829   NITRITE NEGATIVE 10/21/2020 0829   LEUKOCYTESUR SMALL (A) 10/21/2020 0829    Intake/Output Summary (Last 24 hours) at 11/01/2020 2307 Last data filed at 11/01/2020 2110 Gross per 24 hour  Intake 1000 ml  Output --  Net 1000 ml   Lab Results  Component Value Date   CREATININE 3.30 (H) 11/01/2020   CREATININE 3.84 (H) 10/28/2020   CREATININE 3.81 (H) 10/27/2020    COVID-19 Labs  No results for input(s): DDIMER, FERRITIN, LDH, CRP in the last 72 hours.  Lab Results  Component Value Date   SARSCOV2NAA NEGATIVE 11/01/2020   [place] NEGATIVE 10/21/2020   [place] NEGATIVE 10/10/2020   [place] NEGATIVE 09/12/2020    Radiological Exams on Admission: DG Chest 2 View  Result Date: 11/01/2020 CLINICAL DATA:  Chest pain and shortness of breath EXAM: CHEST - 2 VIEW COMPARISON:  October 21, 2020 FINDINGS: There is mild bibasilar atelectasis. There is no edema or airspace opacity. Heart is mildly enlarged with pulmonary vascularity normal. Patient is status post internal mammary bypass grafting. No adenopathy. No bone lesions. No pneumothorax. IMPRESSION: Cardiomegaly with pulmonary vascularity normal. Postoperative changes. Mild bibasilar atelectasis. No edema or airspace opacity. Electronically Signed   By: Lowella Grip III M.D.   On: 11/01/2020 15:51   DG Abd 2 Views  Result Date: 11/01/2020 CLINICAL DATA:  Constipation EXAM: ABDOMEN - 2 VIEW COMPARISON:  September 12, 2020 FINDINGS: Air and stool-filled nondilated loops of bowel. Moderate colonic stool burden predominately in the LEFT colon. Surgical clips project over the heart. Status post median sternotomy. Small bilateral pleural effusions. Vascular calcifications. Chondrocalcinosis of the pubic symphysis. IMPRESSION: Nonobstructive bowel gas pattern. Moderate colonic stool burden, predominately in the LEFT colon. Electronically Signed   By: Valentino Saxon MD   On: 11/01/2020 17:58    EKG: Independently reviewed.  Sinus rhythm 72 with IVCD .    Assessment/Plan Principal Problem:   Chest pain Active Problems:   SOB (shortness of breath)   DM2 (diabetes mellitus, type 2) (HCC)   Leg edema   COPD exacerbation (HCC)    COPD exacerbation (HCC) Assessment & Plan Cont dulera and prn albuterol. Supplemental oxygen as needed. Pt reported SOB and currently on RA. Pt's BNP is 1306 which I suspect is from combination of renal failure.  VBG shows normal oxygen level.    DM2 (diabetes mellitus, type 2) ([place]) Assessment & Plan a1c is high at 8.6 about 3 weeks ago when she was in hospital.  Cont asa 81/ atorvastatin 80 mg for dm related dyslipidemia. Ssi/ accuchecks/ glycemic protocol. Pt given 10 units of iv insulin. She is on home regimen of 70/30 and is held to prevent any hypoglycemia.  HTN: Assessment &  Plan Pt is currently on home regimen of amlodipine and metoprolol and consider switching amlodipine to diltiazem.   Leg edema: Assessment & Plan D/D include amlodipine related or chf related.  BNP elevated, but suspect med related edema heart failure.  Pt continued on torsemide 10 mg daily.  Chest pain/CAD:  Assessment & Plan Pt states she is having chest pain  intermittently and sees cardiologist. We will admit to med tele and cycle troponin and obtain repeat echo and cardiology consulted- Dr. Clayborn Bigness.  Pt had echo in 11/2019 which showed ef of 55-60 and march of this year has reduced EF. Home meds resumed upon previous discharge. Abnormal troponin attributed to her renal failure- we will cycle and follow.   CKD stage iv: Assessment & Plan Lab Results  Component Value Date   CREATININE 3.30 (H) 11/01/2020   CREATININE 3.84 (H) 10/28/2020   CREATININE 3.81 (H) 10/27/2020  Per nephrology note in October 2021 pt  Has renal biopsy in 08/2020 which showed ATN and AIN and we will hold PPI therapy. Pt continued on her prednisone taper.    Anemia: Assessment & Plan Attribute to ACD from renal disease. Anemia panel.   Constipation: Assessment & Plan PRN colace.   DVT prophylaxis:  heparin   Code Status:  Full code   Family Communication:  Sudie Bailey (Father)  [phone] (Mobile)  Disposition Plan:  Home    Consults called:  Cardiology [place].   Admission status: Inpatient    Para Skeans MD [facility] [phone] How to contact the [facility] Attending or Consulting provider [place]  or covering provider during after hours [place], for this patient?    1. Check the care team in [facility] and look for a) attending/consulting [facility] provider listed and b) the [facility] team listed 2. Log into [email or web address] and use [place]'s universal password to access. If you do not have the password, please contact the hospital operator. 3. Locate the [facility] provider you are  looking for under [facility] and page to a number that you can be directly reached. 4. If you still have difficulty reaching the provider, please page the [facility] (Director on Call) for the Hospitalists listed on amion for assistance. [email or web address] Password [facility] 11/01/2020, 11:07 PM

## 2020-11-01 NOTE — ED Triage Notes (Signed)
FIRST NURSE: Pt to ER via EMS with report of all over back pain and pain to left side.

## 2020-11-01 NOTE — Assessment & Plan Note (Signed)
Pt also reports SOB , currently on RA and we will monitor pulse oximetry with vitals.

## 2020-11-01 NOTE — ED Notes (Signed)
IVF re-started after IV placement by IV team. Per report previous IV infiltrated. Repeat troponin collected as well and sent to lab.

## 2020-11-01 NOTE — Assessment & Plan Note (Signed)
D/d include amlodipine related or chf related.  BNP pending.

## 2020-11-01 NOTE — Assessment & Plan Note (Signed)
Pt states she is having chest pain  intermittently and sees cardiologist. We will admit to med tele and cycle troponin and obtain repeat echo and cardiology consulted- Dr. Clayborn Bigness.

## 2020-11-01 NOTE — ED Triage Notes (Signed)
Pt comes ems for SOB and lower back pain. Pt was here in the hospital and d/c Monday. Has CHF and feet are more swollen than normal. Pt crying in triage.

## 2020-11-01 NOTE — ED Notes (Signed)
Cari Beth aware of critical lab values

## 2020-11-01 NOTE — Assessment & Plan Note (Signed)
Cont dulera and prn albuterol.

## 2020-11-02 ENCOUNTER — Inpatient Hospital Stay
Admit: 2020-11-02 | Discharge: 2020-11-02 | Disposition: A | Discharge status: Home / Self Care | Payer: Medicare HMO | Attending: Internal Medicine | Admitting: Internal Medicine

## 2020-11-02 DIAGNOSIS — R079 Chest pain, unspecified: Secondary | ICD-10-CM | POA: Diagnosis not present

## 2020-11-02 LAB — FERRITIN: Ferritin: 166 ng/mL (ref 11–307)

## 2020-11-02 LAB — IRON AND TIBC
Iron: 33 ug/dL (ref 28–170)
Saturation Ratios: 19 % (ref 10.4–31.8)
TIBC: 176 ug/dL — ABNORMAL LOW (ref 250–450)
UIBC: 143 ug/dL

## 2020-11-02 LAB — GLUCOSE, CAPILLARY
Glucose-Capillary: 193 mg/dL — ABNORMAL HIGH (ref 70–99)
Glucose-Capillary: 240 mg/dL — ABNORMAL HIGH (ref 70–99)
Glucose-Capillary: 281 mg/dL — ABNORMAL HIGH (ref 70–99)
Glucose-Capillary: 289 mg/dL — ABNORMAL HIGH (ref 70–99)
Glucose-Capillary: 316 mg/dL — ABNORMAL HIGH (ref 70–99)
Glucose-Capillary: 322 mg/dL — ABNORMAL HIGH (ref 70–99)
Glucose-Capillary: 349 mg/dL — ABNORMAL HIGH (ref 70–99)

## 2020-11-02 LAB — TSH: TSH: 3.905 u[IU]/mL (ref 0.350–4.500)

## 2020-11-02 LAB — BASIC METABOLIC PANEL
Anion gap: 9 (ref 5–15)
BUN: 52 mg/dL — ABNORMAL HIGH (ref 6–20)
CO2: 23 mmol/L (ref 22–32)
Calcium: 7.9 mg/dL — ABNORMAL LOW (ref 8.9–10.3)
Chloride: 107 mmol/L (ref 98–111)
Creatinine, Ser: 3.36 mg/dL — ABNORMAL HIGH (ref 0.44–1.00)
GFR, Estimated: 15 mL/min — ABNORMAL LOW (ref >60)
Glucose, Bld: 343 mg/dL — ABNORMAL HIGH (ref 70–99)
Potassium: 3.4 mmol/L — ABNORMAL LOW (ref 3.5–5.1)
Sodium: 139 mmol/L (ref 135–145)

## 2020-11-02 LAB — FOLATE: Folate: 8.9 ng/mL (ref >5.9)

## 2020-11-02 LAB — CREATININE, SERUM
Creatinine, Ser: 3.14 mg/dL — ABNORMAL HIGH (ref 0.44–1.00)
GFR, Estimated: 17 mL/min — ABNORMAL LOW (ref >60)

## 2020-11-02 LAB — T4, FREE: Free T4: 0.88 ng/dL (ref 0.61–1.12)

## 2020-11-02 LAB — VITAMIN B12: Vitamin B-12: 176 pg/mL — ABNORMAL LOW (ref 180–914)

## 2020-11-02 LAB — MAGNESIUM: Magnesium: 1.7 mg/dL (ref 1.7–2.4)

## 2020-11-02 MED ORDER — BISACODYL 5 MG PO TBEC
10.0000 mg | DELAYED_RELEASE_TABLET | Freq: Once | ORAL | Status: AC
  Administered 2020-11-02: 10 mg via ORAL
  Filled 2020-11-02: qty 2

## 2020-11-02 MED ORDER — FUROSEMIDE 10 MG/ML IJ SOLN
6.0000 mg/h | INTRAVENOUS | Status: DC
  Administered 2020-11-02 – 2020-11-07 (×6): 6 mg/h via INTRAVENOUS
  Filled 2020-11-02 (×4): qty 20

## 2020-11-02 MED ORDER — MIRTAZAPINE 15 MG PO TABS
30.0000 mg | ORAL_TABLET | Freq: Every day | ORAL | Status: DC
  Administered 2020-11-02 – 2020-11-11 (×9): 30 mg via ORAL
  Filled 2020-11-02 (×9): qty 2

## 2020-11-02 MED ORDER — ALBUMIN HUMAN 25 % IV SOLN
25.0000 g | Freq: Three times a day (TID) | INTRAVENOUS | Status: DC
  Administered 2020-11-02 (×2): 25 g via INTRAVENOUS
  Filled 2020-11-02 (×2): qty 100

## 2020-11-02 MED ORDER — CYCLOBENZAPRINE HCL 10 MG PO TABS
5.0000 mg | ORAL_TABLET | Freq: Two times a day (BID) | ORAL | Status: DC | PRN
  Administered 2020-11-02 – 2020-11-03 (×2): 5 mg via ORAL
  Filled 2020-11-02 (×2): qty 1

## 2020-11-02 MED ORDER — ALBUTEROL SULFATE HFA 108 (90 BASE) MCG/ACT IN AERS
1.0000 | INHALATION_SPRAY | Freq: Four times a day (QID) | RESPIRATORY_TRACT | Status: DC | PRN
  Administered 2020-11-04 – 2020-11-06 (×3): 2 via RESPIRATORY_TRACT
  Filled 2020-11-02 (×2): qty 6.7

## 2020-11-02 MED ORDER — POTASSIUM CHLORIDE CRYS ER 20 MEQ PO TBCR
40.0000 meq | EXTENDED_RELEASE_TABLET | Freq: Every day | ORAL | Status: AC
  Administered 2020-11-02: 40 meq via ORAL

## 2020-11-02 MED ORDER — PREDNISOLONE ACETATE 1 % OP SUSP
1.0000 [drp] | Freq: Four times a day (QID) | OPHTHALMIC | Status: DC
  Administered 2020-11-02 – 2020-11-12 (×37): 1 [drp] via OPHTHALMIC
  Filled 2020-11-02 (×2): qty 1

## 2020-11-02 MED ORDER — VITAMIN B-12 1000 MCG PO TABS
1000.0000 ug | ORAL_TABLET | Freq: Every day | ORAL | Status: DC
  Administered 2020-11-02 – 2020-11-12 (×10): 1000 ug via ORAL
  Filled 2020-11-02 (×10): qty 1

## 2020-11-02 MED ORDER — INSULIN GLARGINE 100 UNIT/ML ~~LOC~~ SOLN
20.0000 [IU] | Freq: Every day | SUBCUTANEOUS | Status: DC
  Administered 2020-11-02 – 2020-11-03 (×2): 20 [IU] via SUBCUTANEOUS
  Filled 2020-11-02 (×3): qty 0.2

## 2020-11-02 MED ORDER — MAGNESIUM SULFATE 2 GM/50ML IV SOLN
2.0000 g | Freq: Once | INTRAVENOUS | Status: AC
  Administered 2020-11-02: 2 g via INTRAVENOUS
  Filled 2020-11-02: qty 50

## 2020-11-02 MED ORDER — FUROSEMIDE 10 MG/ML IJ SOLN
40.0000 mg | Freq: Two times a day (BID) | INTRAMUSCULAR | Status: DC
  Administered 2020-11-02: 40 mg via INTRAVENOUS

## 2020-11-02 NOTE — Consult Note (Signed)
CARDIOLOGY CONSULT NOTE               Patient ID: Terry Arroyo MRN: [id number] DOB/AGE: 1961/06/19 59 y.o.  Admit date: 11/01/2020 Referring Physician Dr. Florina Ou hospitalist Primary Physician Frazier Richards primary Primary Cardiologist none Reason for Consultation shortness of breath congestive heart failure i  HPI: 59 year old female presents with shortness of breath dyspnea known significant renal insufficiency cardiomyopathy hypertension hyperlipidemia diabetes who started having shortness of breath she was recently admitted and evaluated less than a week ago she is followed in the heart failure clinic but does not follow with a cardiologist when she had worsening swelling and shortness of breath she was brought to the emergency room and advised to be admitted for further evaluation and care.  She denies any significant chest pain  Review of systems complete and found to be negative unless listed above     Past Medical History:  Diagnosis Date  . Blind left eye   . Brain tumor ([place]) 1986  . CHF (congestive heart failure) ([place])   . Diabetes mellitus without complication ([place])   . Hyperlipidemia   . Hypertension   . MI (myocardial infarction) ([place]) 2018  . Stroke [facility])    stated affected left side.    Past Surgical History:  Procedure Laterality Date  . BRAIN SURGERY    . BREAST EXCISIONAL BIOPSY Left yrs ago   benign  . CESAREAN SECTION      Medications Prior to Admission  Medication Sig Dispense Refill Last Dose  . albuterol (VENTOLIN HFA) 108 (90 Base) MCG/ACT inhaler Inhale 1-2 puffs into the lungs every 6 (six) hours as needed for wheezing or shortness of breath.     Marland Kitchen amLODipine (NORVASC) 10 MG tablet Take 1 tablet (10 mg total) by mouth daily. 30 tablet 0   . ammonium lactate (LAC-HYDRIN) 12 % lotion Apply 1 application topically as needed for dry skin.     Marland Kitchen aspirin 81 MG EC tablet Take 1 tablet (81 mg total) by mouth daily. Do not take this  medication for 1 week and then restart. 30 tablet 0   . atorvastatin (LIPITOR) 80 MG tablet Take 1 tablet (80 mg total) by mouth daily at 6 PM. 30 tablet 0   . carvedilol (COREG) 25 MG tablet Take 25 mg by mouth 2 (two) times daily with a meal.      . cyanocobalamin 1000 MCG tablet Take 1,000 mcg by mouth daily.     . cyclobenzaprine (FLEXERIL) 5 MG tablet Take 5 mg by mouth 2 (two) times daily as needed for muscle spasms.     . fenofibrate (TRICOR) 145 MG tablet Take 145 mg by mouth daily.     Marland Kitchen GLYCERIN-HYPROMELLOSE-PEG 400 OP Place 1 drop into both eyes 4 (four) times daily.     . insulin aspart protamine- aspart (NOVOLOG MIX 70/30) (70-30) 100 UNIT/ML injection Inject 0.4 mLs (40 Units total) into the skin 2 (two) times daily with a meal. (Patient taking differently: Inject 45 Units into the skin 2 (two) times daily with a meal. ) 10 mL 0   . isosorbide mononitrate (IMDUR) 30 MG 24 hr tablet Take 30 mg by mouth daily.     . mirtazapine (REMERON) 30 MG tablet Take 1 tablet (30 mg total) by mouth at bedtime. 30 tablet 0   . mometasone-formoterol (DULERA) 200-5 MCG/ACT AERO Inhale 2 puffs into the lungs 2 (two) times daily.     Marland Kitchen omeprazole (PRILOSEC) 20  MG capsule Take 20 mg by mouth daily.      . prednisoLONE acetate (PRED FORTE) 1 % ophthalmic suspension Place 1 drop into the right eye 4 (four) times daily.     . predniSONE (DELTASONE) 20 MG tablet Take 1 tablet (20 mg total) by mouth daily with breakfast for 14 days. 14 tablet 0   . sodium bicarbonate 650 MG tablet Take 2 tablets (1,300 mg total) by mouth 3 (three) times daily. 180 tablet 1   . topiramate (TOPAMAX) 25 MG tablet Take 25 mg by mouth daily.     Marland Kitchen torsemide (DEMADEX) 10 MG tablet Take 2 tablets (20 mg total) by mouth daily. 60 tablet 0    Social History   Socioeconomic History  . Marital status: Single    Spouse name: Not on file  . Number of children: Not on file  . Years of education: Not on file  . Highest education  level: Not on file  Occupational History  . Not on file  Tobacco Use  . Smoking status: Former Research scientist (life sciences)  . Smokeless tobacco: Never Used  Vaping Use  . Vaping Use: Never used  Substance and Sexual Activity  . Alcohol use: Not Currently  . Drug use: Not Currently  . Sexual activity: Not Currently  Other Topics Concern  . Not on file  Social History Narrative  . Not on file   Social Determinants of Health   Financial Resource Strain:   . Difficulty of Paying Living Expenses: Not on file  Food Insecurity:   . Worried About Charity fundraiser in the Last Year: Not on file  . Ran Out of Food in the Last Year: Not on file  Transportation Needs:   . Lack of Transportation (Medical): Not on file  . Lack of Transportation (Non-Medical): Not on file  Physical Activity:   . Days of Exercise per Week: Not on file  . Minutes of Exercise per Session: Not on file  Stress:   . Feeling of Stress : Not on file  Social Connections:   . Frequency of Communication with Friends and Family: Not on file  . Frequency of Social Gatherings with Friends and Family: Not on file  . Attends Religious Services: Not on file  . Active Member of Clubs or Organizations: Not on file  . Attends Archivist Meetings: Not on file  . Marital Status: Not on file  Intimate Partner Violence:   . Fear of Current or Ex-Partner: Not on file  . Emotionally Abused: Not on file  . Physically Abused: Not on file  . Sexually Abused: Not on file    Family History  Problem Relation Age of Onset  . CAD Mother   . CAD Brother   . Breast cancer Sister 40      Review of systems complete and found to be negative unless listed above      PHYSICAL EXAM  General: Well developed, well nourished, in no acute distress HEENT:  Normocephalic and atramatic Neck:  No JVD.  Lungs: Clear bilaterally to auscultation and percussion. Heart: HRRR . Normal S1 and S2 without gallops or murmurs.  Abdomen: Bowel sounds  are positive, abdomen soft and non-tender  Msk:  Back normal, normal gait. Normal strength and tone for age. Extremities: No clubbing, cyanosis or edema.   Neuro: Alert and oriented X 3. Psych:  Good affect, responds appropriately  Labs:   Lab Results  Component Value Date   WBC 11.7 (H)  11/01/2020   HGB 9.1 (L) 11/01/2020   HCT 28.0 (L) 11/01/2020   MCV 89.5 11/01/2020   PLT 469 (H) 11/01/2020    Recent Labs  Lab 11/02/20 0935  NA 139  K 3.4*  CL 107  CO2 23  BUN 52*  CREATININE 3.36*  CALCIUM 7.9*  GLUCOSE 343*   No results found for: CKTOTAL, CKMB, CKMBINDEX, TROPONINI  Lab Results  Component Value Date   CHOL 290 (H) 01/22/2020   CHOL 316 (H) 11/24/2019   Lab Results  Component Value Date   HDL 46 01/22/2020   HDL 39 (L) 11/24/2019   Lab Results  Component Value Date   LDLCALC UNABLE TO CALCULATE IF TRIGLYCERIDE OVER 400 mg/dL 01/22/2020   LDLCALC UNABLE TO CALCULATE IF TRIGLYCERIDE OVER 400 mg/dL 11/24/2019   Lab Results  Component Value Date   TRIG 453 (H) 01/22/2020   TRIG 564 (H) 11/24/2019   Lab Results  Component Value Date   CHOLHDL 6.3 01/22/2020   CHOLHDL 8.1 11/24/2019   Lab Results  Component Value Date   LDLDIRECT 145.2 (H) 01/22/2020   LDLDIRECT 140.0 (H) 11/24/2019      Radiology: DG Chest 2 View  Result Date: 11/01/2020 CLINICAL DATA:  Chest pain and shortness of breath EXAM: CHEST - 2 VIEW COMPARISON:  October 21, 2020 FINDINGS: There is mild bibasilar atelectasis. There is no edema or airspace opacity. Heart is mildly enlarged with pulmonary vascularity normal. Patient is status post internal mammary bypass grafting. No adenopathy. No bone lesions. No pneumothorax. IMPRESSION: Cardiomegaly with pulmonary vascularity normal. Postoperative changes. Mild bibasilar atelectasis. No edema or airspace opacity. Electronically Signed   By: Lowella Grip III M.D.   On: 11/01/2020 15:51   DG Chest 2 View  Result Date:  10/10/2020 CLINICAL DATA:  Dyspnea EXAM: CHEST - 2 VIEW COMPARISON:  02/26/2020 FINDINGS: The lungs are symmetrically well expanded. Tiny right pleural effusion has developed. Multifocal nodular infiltrate has developed within the left lung, likely infectious or inflammatory in nature. Minimal infiltrate may be present at the right lung base. No pneumothorax. Coronary artery bypass grafting has been performed. Cardiac size within normal limits. No acute bone abnormality. IMPRESSION: Interval development of nodular asymmetric pulmonary infiltrate, likely infectious or inflammatory. Electronically Signed   By: Fidela Salisbury MD   On: 10/10/2020 12:23   CT THORACIC SPINE WO CONTRAST  Result Date: 10/24/2020 CLINICAL DATA:  Mid back pain EXAM: CT THORACIC SPINE WITHOUT CONTRAST TECHNIQUE: Multidetector CT images of the thoracic were obtained using the standard protocol without intravenous contrast. COMPARISON:  CT chest 01/22/2020 FINDINGS: Alignment: Normal Vertebrae: Negative for fracture or mass. No evidence of spinal infection. Paraspinal and other soft tissues: Negative for paraspinous No fluid mass collection or edema. Small bilateral pleural effusions. Diffuse mild bilateral airspace disease. Disc levels: No significant disc space narrowing. No disc degeneration or spurring identified. Negative for spinal stenosis. IMPRESSION: Negative CT thoracic spine Bilateral airspace disease and bilateral small pleural effusions which may represent congestive heart failure based on prior chest x-ray. Electronically Signed   By: Franchot Gallo M.D.   On: 10/24/2020 15:14   CT LUMBAR SPINE WO CONTRAST  Result Date: 10/24/2020 CLINICAL DATA:  Low back pain EXAM: CT LUMBAR SPINE WITHOUT CONTRAST TECHNIQUE: Multidetector CT imaging of the lumbar spine was performed without intravenous contrast administration. Multiplanar CT image reconstructions were also generated. COMPARISON:  CT abdomen pelvis 09/12/2020 FINDINGS:  Segmentation: Normal Alignment: Normal Vertebrae: Negative for fracture or mass. No evidence  of spinal infection. Paraspinal and other soft tissues: Atherosclerotic abdominal aorta without aneurysm. Lobular contours to the left renal cortex unchanged. Mild left hydronephrosis unchanged. No obstructing calculus. No paraspinous soft tissue edema or fluid collection. Disc levels: L1-2: Negative L2-3: Mild disc and facet degeneration. Negative for disc protrusion or stenosis. L3-4: Mild disc and mild facet degeneration. Negative for disc protrusion or stenosis L4-5: Mild disc bulging and mild facet degeneration. Negative for disc protrusion or stenosis. L5-S1: Mild disc degeneration. Negative for disc protrusion or stenosis. IMPRESSION: Mild lumbar degenerative change.  No cause for acute lumbar pain. Electronically Signed   By: Franchot Gallo M.D.   On: 10/24/2020 15:23   DG Chest Portable 1 View  Result Date: 10/21/2020 CLINICAL DATA:  Shortness of breath for 1 week. EXAM: PORTABLE CHEST 1 VIEW COMPARISON:  10/10/2020 FINDINGS: Post median sternotomy and CABG mild cardiomegaly. Progressive bilateral pleural effusions, now moderate in degree. Fluid in the right minor fissure. Mild pulmonary edema. The previous nodular densities in the left greater than right lung are not definitively seen and may be partially obscured on the current exam by pulmonary edema. No pneumothorax. No acute osseous abnormalities are seen. IMPRESSION: Mild cardiomegaly with pulmonary edema and bilateral pleural effusions, consistent with CHF. Electronically Signed   By: Keith Rake M.D.   On: 10/21/2020 19:35   DG Abd 2 Views  Result Date: 11/01/2020 CLINICAL DATA:  Constipation EXAM: ABDOMEN - 2 VIEW COMPARISON:  September 12, 2020 FINDINGS: Air and stool-filled nondilated loops of bowel. Moderate colonic stool burden predominately in the LEFT colon. Surgical clips project over the heart. Status post median sternotomy. Small  bilateral pleural effusions. Vascular calcifications. Chondrocalcinosis of the pubic symphysis. IMPRESSION: Nonobstructive bowel gas pattern. Moderate colonic stool burden, predominately in the LEFT colon. Electronically Signed   By: Valentino Saxon MD   On: 11/01/2020 17:58    EKG: Normal sinus rhythm nonspecific ST-T wave changes  ASSESSMENT AND PLAN:  Shortness of breath Congestive heart failure Leg edema Hypertension Renal insufficiency stage IV History of brain tumor Diabetes Hyperlipidemia History of CVA . Plan Agree with admit to telemetry Follow-up troponins and EKGs Consider echocardiogram for assessment left ventricular function Recommend nephrology input for renal insufficiency Patient has been followed in the heart failure clinic but evidently does not have a regular cardiologist Continue diabetes management and support Statin therapy for hyperlipidemia Defer cardiac cath because of severe renal insufficiency Recommend aggressive medical therapy for heart failure We will avoid ACE ARB or Entresto because of renal insufficiency  Signed: Yolonda Kida MD 11/02/2020, 11:39 AM

## 2020-11-02 NOTE — Progress Notes (Signed)
Magnesium IV not compatible with IV lasix gtt. Attempted new IV  Not successful Second RN attempt. Unsuccessful Provider prefers patient to have medication IV. Consult placed for IV team.  Magnesium rescheduled for later today

## 2020-11-02 NOTE — Progress Notes (Signed)
PROGRESS NOTE    Terry Arroyo  IWO:[id number] DOB: 1961-06-09 DOA: 11/01/2020 PCP: Kirk Ruths, MD  Outpatient Specialists: [facility]    Brief Narrative:   From admitter h & p: Terry Arroyo is a 59 y.o. female with medical history significant of dm ii, htn, cva, cad seen in ed for sob and le edema. Chart review has march echo which shows moderately decreased LV function with ef of 30%, different from 55-60 in 11/2019. She was recently discharged in October 2021 for similar presentation  and is returning to [place] for not feeling well with generalized complaints of chest pain sob and le edema..  I do not see  cardiology consult note pt, reports she does see cardiologist in addition to nephrologist as well.. Pt lives alone. Pt is retired, pt sees a Production designer, theatre/television/film. Pt states that she does not think she wants snf options but needs someone at home for cleaning and cooking etc.[facility] was started on her case last admission but pt was on wait list.   Assessment & Plan:   Principal Problem:   Chest pain Active Problems:   DM2 (diabetes mellitus, type 2) (HCC)   Leg edema   COPD exacerbation (HCC)   SOB (shortness of breath)   # HFrEF with mild exacerbation # Chest pain # Coronary artery disease. Patient reports few days predominantly dyspnea with exertion worse than baseline. Here no hypoxia documented. On Faulk 2 L at time of my exam but I took it off and o2 remained normal for 10 minutes. Does have moderate LE pitting edema. CXR w/o overt signs vascular congestion. EF 30-35 earlier this year. Troponins mildly elevated and flat, chest pain has subsided this morning, this in setting of sig ckd, ekg non-ischemic. Hx multi--vessel cabg 2018.  - daily weights, strict I/os - hold home torsemide 10, start lasix 40 iv bid - cont aspirin, atorvastatin, carvedilol, imdur; will leave further evidence-based therapy additions to cardiology - f/u cardiology recs when available -  monitor O2  # COPD  Appears more or less quiescent - cont home dulera, albuterol prn  # Debility - PT/OT consult  # Hypokalemia  K 3 on admission, Mg unknown. Received 33 of Kcl yesterday - repeat k, mg - daily kcl 40  # CKD 4/5 Renal biopsy from September 11, 2020 shows acute tubular injury with mild acute interstitial nephritis suggestive of a hypersensitivity etiology.  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 GFR of about 15 seems to be new baseline. - cont home prednisone 20 qd, sodium bicarb - nephrology consulted   # HTN - here bp moderately elevated - cont home amlodipine, coreg, imdur - lasix as above, if bp doesn't improve w/ diuresis consider additional agents  # Anemia of chronic kidney disease Stable  # T2DM - here gluocose elevated to 300s - hold home 70/30 - start lantus 20 qd - SSI  # Constipation - colace  # OSA Not on cpap at home   DVT prophylaxis: heparin Code Status: full Family Communication: none @ bedside  Status is: Inpatient  Remains inpatient appropriate because:Inpatient level of care appropriate due to severity of illness   Dispo: The patient is from: Home              Anticipated d/c is to: TBD              Anticipated d/c date is: 2-3 days  Patient currently is not medically stable to d/c.        Consultants:  Cardiology, nephrology  Procedures: none  Antimicrobials:  none    Subjective: This morning chest pain has subsided. No sob while resting. Has appetite, no vomiting.   Objective: Vitals:   11/02/20 0400 11/02/20 0600 11/02/20 0800 11/02/20 0851  BP: (!) 155/81  (!) 174/91   Pulse: 67 65 65 64  Resp: 12 13 (!) 29 16  Temp: 98.5 F (36.9 C)  98.8 F (37.1 C)   TempSrc: Oral  Oral   SpO2: 100% 100% 100% 99%  Weight: 84.4 kg     Height:        Intake/Output Summary (Last 24 hours) at 11/02/2020  0900 Last data filed at 11/02/2020 0626 Gross per 24 hour  Intake 1360 ml  Output 325 ml  Net 1035 ml   Filed Weights   11/01/20 1512 11/02/20 0033 11/02/20 0400  Weight: 88 kg 84.4 kg 84.4 kg    Examination:  General exam: Appears calm and comfortable  Respiratory system: scattered faint rhonchi, no wheeze Cardiovascular system: S1 & S2 heard, RRR. Soft systolic murmur Gastrointestinal system: Abdomen is nondistended, soft and nontender. No organomegaly or masses felt. Normal bowel sounds heard. Central nervous system: Alert and oriented. No focal neurological deficits. Extremities: Symmetric 5 x 5 power. Skin: No rashes. Moderate pitting edema. Psychiatry: speech a bit slowed, affect flat    Data Reviewed: I have personally reviewed following labs and imaging studies  CBC: Recent Labs  Lab 10/27/20 0452 10/28/20 0404 11/01/20 1520  WBC 13.7* 10.0 11.7*  HGB 9.9* 8.6* 9.1*  HCT 29.1* 26.4* 28.0*  MCV 86.9 88.3 89.5  PLT 511* 486* 379*   Basic Metabolic Panel: Recent Labs  Lab 10/27/20 0452 10/28/20 0404 11/01/20 1520 11/01/20 2323  NA 132* 132* 134*  --   K 3.5 3.4* 3.0*  --   CL 98 99 101  --   CO2 23 24 22   --   GLUCOSE 346* 310* 520*  --   BUN 72* 77* 60*  --   CREATININE 3.81* 3.84* 3.30* 3.14*  CALCIUM 8.7* 8.0* 8.0*  --    GFR: Estimated Creatinine Clearance: 21.4 mL/min (A) (by C-G formula based on SCr of 3.14 mg/dL (H)). Liver Function Tests: No results for input(s): AST, ALT, ALKPHOS, BILITOT, PROT, ALBUMIN in the last 168 hours. No results for input(s): LIPASE, AMYLASE in the last 168 hours. No results for input(s): AMMONIA in the last 168 hours. Coagulation Profile: No results for input(s): INR, PROTIME in the last 168 hours. Cardiac Enzymes: No results for input(s): CKTOTAL, CKMB, CKMBINDEX, TROPONINI in the last 168 hours. BNP (last 3 results) No results for input(s): PROBNP in the last 8760 hours. HbA1C: No results for input(s):  HGBA1C in the last 72 hours. CBG: Recent Labs  Lab 11/01/20 2331 11/02/20 0011 11/02/20 0241 11/02/20 0622 11/02/20 0842  GLUCAP 206* 193* 316* 349* 322*   Lipid Profile: No results for input(s): CHOL, HDL, LDLCALC, TRIG, CHOLHDL, LDLDIRECT in the last 72 hours. Thyroid Function Tests: Recent Labs    11/01/20 2323  TSH 3.905  FREET4 0.88   Anemia Panel: Recent Labs    11/01/20 2323  FOLATE 8.9  FERRITIN 166  TIBC 176*  IRON 33  RETICCTPCT 2.8   Urine analysis:    Component Value Date/Time   COLORURINE STRAW (A) 10/21/2020 0829   APPEARANCEUR CLEAR (A) 10/21/2020 0829   LABSPEC 1.007 10/21/2020 0240  PHURINE 7.0 10/21/2020 0829   GLUCOSEU 150 (A) 10/21/2020 0829   HGBUR NEGATIVE 10/21/2020 0829   BILIRUBINUR NEGATIVE 10/21/2020 0829   KETONESUR NEGATIVE 10/21/2020 0829   PROTEINUR >=300 (A) 10/21/2020 0829   NITRITE NEGATIVE 10/21/2020 0829   LEUKOCYTESUR SMALL (A) 10/21/2020 0829   Sepsis Labs: @LABRCNTIP (procalcitonin:4,lacticidven:4)  ) Recent Results (from the past 240 hour(s))  Respiratory Panel by RT PCR (Flu A&B, Covid) - Nasopharyngeal Swab     Status: None   Collection Time: 11/01/20  9:18 PM   Specimen: Nasopharyngeal Swab  Result Value Ref Range Status   SARS Coronavirus 2 by RT PCR NEGATIVE NEGATIVE Final    Comment: (NOTE) SARS-CoV-2 target nucleic acids are NOT DETECTED.  The SARS-CoV-2 RNA is generally detectable in upper respiratoy specimens during the acute phase of infection. The lowest concentration of SARS-CoV-2 viral copies this assay can detect is 131 copies/mL. A negative result does not preclude SARS-Cov-2 infection and should not be used as the sole basis for treatment or other patient management decisions. A negative result may occur with  improper specimen collection/handling, submission of specimen other than nasopharyngeal swab, presence of viral mutation(s) within the areas targeted by this assay, and inadequate number of  viral copies (<131 copies/mL). A negative result must be combined with clinical observations, patient history, and epidemiological information. The expected result is Negative.  Fact Sheet for Patients:  PinkCheek.be  Fact Sheet for Healthcare Providers:  GravelBags.it  This test is no t yet approved or cleared by the [place] FDA and  has been authorized for detection and/or diagnosis of SARS-CoV-2 by FDA under an Emergency Use Authorization (EUA). This EUA will remain  in effect (meaning this test can be used) for the duration of the COVID-19 declaration under Section 564(b)(1) of the Act, 21 U.S.C. section 360bbb-3(b)(1), unless the authorization is terminated or revoked sooner.     Influenza A by PCR NEGATIVE NEGATIVE Final   Influenza B by PCR NEGATIVE NEGATIVE Final    Comment: (NOTE) The Xpert Xpress SARS-CoV-2/FLU/RSV assay is intended as an aid in  the diagnosis of influenza from Nasopharyngeal swab specimens and  should not be used as a sole basis for treatment. Nasal washings and  aspirates are unacceptable for Xpert Xpress SARS-CoV-2/FLU/RSV  testing.  Fact Sheet for Patients: PinkCheek.be  Fact Sheet for Healthcare Providers: GravelBags.it  This test is not yet approved or cleared by the [place] FDA and  has been authorized for detection and/or diagnosis of SARS-CoV-2 by  FDA under an Emergency Use Authorization (EUA). This EUA will remain  in effect (meaning this test can be used) for the duration of the  Covid-19 declaration under Section 564(b)(1) of the Act, 21  U.S.C. section 360bbb-3(b)(1), unless the authorization is  terminated or revoked. Performed at [facility], [address] [place], [place] [address]          Radiology Studies: DG Chest 2 View  Result Date: 11/01/2020 CLINICAL DATA:  Chest pain and  shortness of breath EXAM: CHEST - 2 VIEW COMPARISON:  October 21, 2020 FINDINGS: There is mild bibasilar atelectasis. There is no edema or airspace opacity. Heart is mildly enlarged with pulmonary vascularity normal. Patient is status post internal mammary bypass grafting. No adenopathy. No bone lesions. No pneumothorax. IMPRESSION: Cardiomegaly with pulmonary vascularity normal. Postoperative changes. Mild bibasilar atelectasis. No edema or airspace opacity. Electronically Signed   By: Lowella Grip III M.D.   On: 11/01/2020 15:51   DG Abd  2 Views  Result Date: 11/01/2020 CLINICAL DATA:  Constipation EXAM: ABDOMEN - 2 VIEW COMPARISON:  September 12, 2020 FINDINGS: Air and stool-filled nondilated loops of bowel. Moderate colonic stool burden predominately in the LEFT colon. Surgical clips project over the heart. Status post median sternotomy. Small bilateral pleural effusions. Vascular calcifications. Chondrocalcinosis of the pubic symphysis. IMPRESSION: Nonobstructive bowel gas pattern. Moderate colonic stool burden, predominately in the LEFT colon. Electronically Signed   By: Valentino Saxon MD   On: 11/01/2020 17:58        Scheduled Meds: . amLODipine  10 mg Oral Daily  . aspirin EC  81 mg Oral Daily  . atorvastatin  80 mg Oral q1800  . carvedilol  25 mg Oral BID WC  . famotidine  10 mg Oral Daily  . fenofibrate  160 mg Oral q1800  . heparin  5,000 Units Subcutaneous Q8H  . insulin aspart  0-15 Units Subcutaneous TID WC  . insulin glargine  20 Units Subcutaneous Daily  . isosorbide mononitrate  30 mg Oral Daily  . mirtazapine  30 mg Oral QHS  . mometasone-formoterol  2 puff Inhalation BID  . potassium chloride  40 mEq Oral Daily  . prednisoLONE acetate  1 drop Right Eye QID  . predniSONE  20 mg Oral Q breakfast  . sodium bicarbonate  1,300 mg Oral TID  . sodium chloride flush  3 mL Intravenous Q12H  . topiramate  25 mg Oral Daily  . torsemide  10 mg Oral Daily  .  cyanocobalamin  1,000 mcg Oral Daily   Continuous Infusions: . sodium chloride       LOS: 1 day    Time spent: 50 min    Desma Maxim, MD [facility]   If 7PM-7AM, please contact night-coverage [email or web address] Password [email or web address] 11/02/2020, 9:00 AM

## 2020-11-02 NOTE — Plan of Care (Signed)
  Problem: Education: Goal: Knowledge of General Education information will improve Description: Including pain rating scale, medication(s)/side effects and non-pharmacologic comfort measures Outcome: Progressing   Problem: Health Behavior/Discharge Planning: Goal: Ability to manage health-related needs will improve Outcome: Progressing   Problem: Clinical Measurements: Goal: Respiratory complications will improve Outcome: Progressing   Problem: Pain Managment: Goal: General experience of comfort will improve Outcome: Progressing   Problem: Safety: Goal: Ability to remain free from injury will improve Outcome: Progressing   

## 2020-11-02 NOTE — TOC Progression Note (Signed)
Transition of Care [facility]) - Progression Note    Patient Details  Name: Terry Arroyo MRN: [id number] Date of Birth: 12-19-1961  [facility] of [facility] CM/SW Contact  Izola Price, RN Phone Number: 11/02/2020, 4:30 PM  Clinical Narrative:   11/14 1530  PT/OT evaluations just in. HH PT/ with RW instead of SNF.  Anticipated DC 2-3 days. Not medically stable today. Simmie Davies RN CM         Expected Discharge Plan and Services                                                 Social Determinants of Health (SDOH) Interventions    Readmission Risk Interventions Readmission Risk Prevention Plan 10/23/2020 10/12/2020  Transportation Screening Complete Complete  Medication Review Press photographer) Complete Complete  PCP or Specialist appointment within 3-5 days of discharge Complete Complete  HRI or Home Care Consult Complete -  SW Recovery Care/Counseling Consult Complete Complete  Palliative Care Screening Not Applicable Not Applicable  Skilled Nursing Facility Not Applicable Not Complete  SNF Comments - Patient pending evaluation  Some recent data might be hidden

## 2020-11-02 NOTE — Plan of Care (Signed)
  Problem: Education: Goal: Knowledge of General Education information will improve Description: Including pain rating scale, medication(s)/side effects and non-pharmacologic comfort measures Outcome: Progressing   Problem: Clinical Measurements: Goal: Respiratory complications will improve Outcome: Progressing   Problem: Pain Managment: Goal: General experience of comfort will improve Outcome: Progressing   Problem: Safety: Goal: Ability to remain free from injury will improve Outcome: Progressing   

## 2020-11-02 NOTE — Progress Notes (Signed)
OT Cancellation Note  Patient Details Name: Terry Arroyo MRN: [id number] DOB: Sep 05, 1961   Cancelled Treatment:    Reason Eval/Treat Not Completed: Fatigue/lethargy limiting ability to participate. OT order received and chart reviewed. Pt familiar to this OT. Upon arrival pt reports fatigue following recent PT eval, declines OT assessment this date. Will continue to follow and initiate services as pt able.   [name], M.S. OTR/L  11/02/20, 2:53 PM  ascom [phone]

## 2020-11-02 NOTE — Progress Notes (Addendum)
[facility]  ROUNDING NOTE   Subjective:  Patient well-known to [place] from the office. Had renal biopsy performed in September. This demonstrated mild allergic interstitial nephritis along with underlying diabetic nephropathy. Recently her baseline creatinine has fluctuated between 3.3-3.9. eGFR currently 15. Came in with significant shortness of breath and lower extremity edema.   Objective:  Vital signs in last 24 hours:  Temp:  [98.3 F (36.8 C)-98.8 F (37.1 C)] 98.6 F (37 C) (11/14 1200) Pulse Rate:  [60-72] 67 (11/14 1200) Resp:  [12-29] 16 (11/14 1200) BP: (100-190)/(63-111) 142/86 (11/14 1200) SpO2:  [92 %-100 %] 99 % (11/14 1200) Weight:  [84.4 kg-88 kg] 84.4 kg (11/14 0400)  Weight change:  Filed Weights   11/01/20 1512 11/02/20 0033 11/02/20 0400  Weight: 88 kg 84.4 kg 84.4 kg    Intake/Output: I/O last 3 completed shifts: In: 1360 [P.O.:360; IV Piggyback:1000] Out: 325 [Urine:325]   Intake/Output this shift:  Total I/O In: -  Out: 1700 [Urine:1700]  Physical Exam: General:  No acute distress  Head:  Normocephalic, atraumatic. Moist oral mucosal membranes  Eyes:  Anicteric  Neck:  Supple  Lungs:   Clear to auscultation, normal effort  Heart:  S1S2 no rubs  Abdomen:   Soft, nontender, bowel sounds present  Extremities:  2+ peripheral edema.  Neurologic:  Awake, alert, following commands  Skin:  No lesions  Access:  No hemodialysis access    Basic Metabolic Panel: Recent Labs  Lab 10/27/20 0452 10/27/20 0452 10/28/20 0404 11/01/20 1520 11/01/20 2323 11/02/20 0935  NA 132*  --  132* 134*  --  139  K 3.5  --  3.4* 3.0*  --  3.4*  CL 98  --  99 101  --  107  CO2 23  --  24 22  --  23  GLUCOSE 346*  --  310* 520*  --  343*  BUN 72*  --  77* 60*  --  52*  CREATININE 3.81*  --  3.84* 3.30* 3.14* 3.36*  CALCIUM 8.7*   < > 8.0* 8.0*  --  7.9*  MG  --   --   --   --   --  1.7   < > = values in this interval not displayed.    Liver  Function Tests: No results for input(s): AST, ALT, ALKPHOS, BILITOT, PROT, ALBUMIN in the last 168 hours. No results for input(s): LIPASE, AMYLASE in the last 168 hours. No results for input(s): AMMONIA in the last 168 hours.  CBC: Recent Labs  Lab 10/27/20 0452 10/28/20 0404 11/01/20 1520  WBC 13.7* 10.0 11.7*  HGB 9.9* 8.6* 9.1*  HCT 29.1* 26.4* 28.0*  MCV 86.9 88.3 89.5  PLT 511* 486* 469*    Cardiac Enzymes: No results for input(s): CKTOTAL, CKMB, CKMBINDEX, TROPONINI in the last 168 hours.  BNP: Invalid input(s): POCBNP  CBG: Recent Labs  Lab 11/02/20 0011 11/02/20 0241 11/02/20 0622 11/02/20 0842 11/02/20 1159  GLUCAP 193* 316* 349* 322* 50*    Microbiology: Results for orders placed or performed during the hospital encounter of 11/01/20  Respiratory Panel by RT PCR (Flu A&B, Covid) - Nasopharyngeal Swab     Status: None   Collection Time: 11/01/20  9:18 PM   Specimen: Nasopharyngeal Swab  Result Value Ref Range Status   SARS Coronavirus 2 by RT PCR NEGATIVE NEGATIVE Final    Comment: (NOTE) SARS-CoV-2 target nucleic acids are NOT DETECTED.  The SARS-CoV-2 RNA is generally detectable in  upper respiratoy specimens during the acute phase of infection. The lowest concentration of SARS-CoV-2 viral copies this assay can detect is 131 copies/mL. A negative result does not preclude SARS-Cov-2 infection and should not be used as the sole basis for treatment or other patient management decisions. A negative result may occur with  improper specimen collection/handling, submission of specimen other than nasopharyngeal swab, presence of viral mutation(s) within the areas targeted by this assay, and inadequate number of viral copies (<131 copies/mL). A negative result must be combined with clinical observations, patient history, and epidemiological information. The expected result is Negative.  Fact Sheet for Patients:   PinkCheek.be  Fact Sheet for Healthcare Providers:  GravelBags.it  This test is no t yet approved or cleared by the [place] FDA and  has been authorized for detection and/or diagnosis of SARS-CoV-2 by FDA under an Emergency Use Authorization (EUA). This EUA will remain  in effect (meaning this test can be used) for the duration of the COVID-19 declaration under Section 564(b)(1) of the Act, 21 U.S.C. section 360bbb-3(b)(1), unless the authorization is terminated or revoked sooner.     Influenza A by PCR NEGATIVE NEGATIVE Final   Influenza B by PCR NEGATIVE NEGATIVE Final    Comment: (NOTE) The Xpert Xpress SARS-CoV-2/FLU/RSV assay is intended as an aid in  the diagnosis of influenza from Nasopharyngeal swab specimens and  should not be used as a sole basis for treatment. Nasal washings and  aspirates are unacceptable for Xpert Xpress SARS-CoV-2/FLU/RSV  testing.  Fact Sheet for Patients: PinkCheek.be  Fact Sheet for Healthcare Providers: GravelBags.it  This test is not yet approved or cleared by the [place] FDA and  has been authorized for detection and/or diagnosis of SARS-CoV-2 by  FDA under an Emergency Use Authorization (EUA). This EUA will remain  in effect (meaning this test can be used) for the duration of the  Covid-19 declaration under Section 564(b)(1) of the Act, 21  U.S.C. section 360bbb-3(b)(1), unless the authorization is  terminated or revoked. Performed at [facility], [address] [place], [place] [address]     Coagulation Studies: No results for input(s): LABPROT, INR in the last 72 hours.  Urinalysis: No results for input(s): COLORURINE, LABSPEC, PHURINE, GLUCOSEU, HGBUR, BILIRUBINUR, KETONESUR, PROTEINUR, UROBILINOGEN, NITRITE, LEUKOCYTESUR in the last 72 hours.  Invalid input(s): APPERANCEUR    Imaging: DG  Chest 2 View  Result Date: 11/01/2020 CLINICAL DATA:  Chest pain and shortness of breath EXAM: CHEST - 2 VIEW COMPARISON:  October 21, 2020 FINDINGS: There is mild bibasilar atelectasis. There is no edema or airspace opacity. Heart is mildly enlarged with pulmonary vascularity normal. Patient is status post internal mammary bypass grafting. No adenopathy. No bone lesions. No pneumothorax. IMPRESSION: Cardiomegaly with pulmonary vascularity normal. Postoperative changes. Mild bibasilar atelectasis. No edema or airspace opacity. Electronically Signed   By: Lowella Grip III M.D.   On: 11/01/2020 15:51   DG Abd 2 Views  Result Date: 11/01/2020 CLINICAL DATA:  Constipation EXAM: ABDOMEN - 2 VIEW COMPARISON:  September 12, 2020 FINDINGS: Air and stool-filled nondilated loops of bowel. Moderate colonic stool burden predominately in the LEFT colon. Surgical clips project over the heart. Status post median sternotomy. Small bilateral pleural effusions. Vascular calcifications. Chondrocalcinosis of the pubic symphysis. IMPRESSION: Nonobstructive bowel gas pattern. Moderate colonic stool burden, predominately in the LEFT colon. Electronically Signed   By: Valentino Saxon MD   On: 11/01/2020 17:58     Medications:   . sodium  chloride     . amLODipine  10 mg Oral Daily  . aspirin EC  81 mg Oral Daily  . atorvastatin  80 mg Oral q1800  . carvedilol  25 mg Oral BID WC  . famotidine  10 mg Oral Daily  . fenofibrate  160 mg Oral q1800  . furosemide  40 mg Intravenous BID  . heparin  5,000 Units Subcutaneous Q8H  . insulin aspart  0-15 Units Subcutaneous TID WC  . insulin glargine  20 Units Subcutaneous Daily  . isosorbide mononitrate  30 mg Oral Daily  . mirtazapine  30 mg Oral QHS  . mometasone-formoterol  2 puff Inhalation BID  . prednisoLONE acetate  1 drop Right Eye QID  . predniSONE  20 mg Oral Q breakfast  . sodium bicarbonate  1,300 mg Oral TID  . sodium chloride flush  3 mL Intravenous  Q12H  . topiramate  25 mg Oral Daily  . cyanocobalamin  1,000 mcg Oral Daily   sodium chloride, albuterol, cyclobenzaprine, sodium chloride flush  Assessment/ Plan:  59 y.o. female with past medical history of congestive heart failure, diabetes mellitus type 2 with chronic kidney disease, hypertension, hyperlipidemia, chronic kidney disease stage IV with diabetic nephropathy and interstitial nephritis, history of brain tumor, left eye blindness who presented with increasing shortness of breath and lower extremity edema.  1.  Diabetes mellitus type 2 with chronic kidney disease biopsy-proven/chronic kidney disease stage IV/proteinuria/history of mild interstitial nephritis.  Patient had biopsy performed back in September.  This showed some mild hypersensitivity reaction along with a background of diabetic nephropathy.  Reports having increasing lower extremity edema and shortness of breath.  Suspect that she is volume overloaded from nephrotic syndrome.  Obtain urine protein to creatinine ratio.  Start the patient on furosemide drip at 6 mg/h and discontinue IV bolus Lasix.  Also add albumin 25 g IV every 8 hours.  Monitor renal function closely.  Advised the patient that there is high likelihood of progression of her underlying chronic kidney disease to ESRD.  2.  Anemia of chronic kidney disease.  Hemoglobin 9.1.  May need to consider Epogen as an outpatient.  3.  Metabolic acidosis.  Maintain the patient on sodium bicarb and repletion for now.  4.  Thanks for consultation.   LOS: 1 Hartman Minahan 11/14/20211:29 PM

## 2020-11-02 NOTE — Progress Notes (Signed)
*  PRELIMINARY RESULTS* Echocardiogram 2D Echocardiogram has been performed.  Terry Arroyo 11/02/2020, 7:27 PM

## 2020-11-02 NOTE — Progress Notes (Signed)
Physical Therapy Treatment Patient Details Name: Terry Arroyo MRN: [id number] DOB: 1961/02/11 Today's Date: 11/02/2020    History of Present Illness Terry Arroyo is a 59 y.o. female with medical history significant of dm ii, htn, cva, cad seen in ed for sob and le edema. Chart review has march echo which shows moderately decreased LV function with ef of 30%. She was recently discharged in October 2021 for similar presentation  and is returning to [place] for not feeling well with generalized complaints of chest pain sob and le edema..  I do not see  cardiology consult note pt, reports she does see cardiologist in addition to nephrologist as well. Patient lives alone in an apartment for seniors community. [facility] was started on her case last admission but pt was on wait list.    PT Comments    Pt is able to complete all basic transfers with modI with good carry over of all cuing for ind with RW with safety. Patient ambulates supervision 121ft with good carry over of cuing for increased foot clearance for decreased fall risk with good carry over. Pt fatigued at the end of session reporting BLE are "tired". Patient would benefit from HHPT to increase BLE strength/endurance in order to safely complete community errands. PT recommendation HHPT if patient has family in the area to complete community errands, as patietn fatigues quickly at this time (patient reports she does). Would benefit from skilled PT to address above deficits and promote optimal return to PLOF.   Follow Up Recommendations  Home health PT     Equipment Recommendations  Rolling walker with 5" wheels    Recommendations for Other Services       Precautions / Restrictions      Mobility  Bed Mobility Overal bed mobility: Modified Independent             General bed mobility comments: increased time needed for LE negotiation, physically ind  Transfers Overall transfer level: Needs assistance   Transfers: Sit  to/from Stand Sit to Stand: Modified independent (Device/Increase time)         General transfer comment: increased time and cuing for set up/hand placement for RW safety with good carry over  Ambulation/Gait Ambulation/Gait assistance: Supervision Gait Distance (Feet): 150 Feet Assistive device: Rolling walker (2 wheeled)   Gait velocity: decreased   General Gait Details: Min cuing for foot clearance verbalizing importance to prevent falls   Stairs             Wheelchair Mobility    Modified Rankin (Stroke Patients Only)       Balance Overall balance assessment: Needs assistance Sitting-balance support: No upper extremity supported;Feet supported Sitting balance-Leahy Scale: Normal     Standing balance support: During functional activity;No upper extremity supported;Bilateral upper extremity supported Standing balance-Leahy Scale: Good Standing balance comment: able to remain standing without RW for a couple minutes, relies on RW for amb                            Cognition Arousal/Alertness: Awake/alert Behavior During Therapy: [facility] for tasks assessed/performed Overall Cognitive Status: Within Functional Limits for tasks assessed                                        Exercises Other Exercises Other Exercises: PT educated on RW safety in transfers  and through ambulation. cuing for increased foot clearance wtih gait for increased safety with good carry over    General Comments        Pertinent Vitals/Pain Pain Assessment: No/denies pain    Home Living Family/patient expects to be discharged to:: Private residence Living Arrangements: Alone Available Help at Discharge: Family;Available PRN/intermittently Type of Home: Apartment Home Access: Level entry;Elevator   Home Layout: One level Home Equipment: Cane - single point Additional Comments: Pt lives with father who is 41 but very high functioning and able to assist as  needed.  Performs community tasks including driving. Pt reports she always uses SPC for functional mobility.    Prior Function Level of Independence: Independent with assistive device(s)      Comments: Patient is ambulatory with single point cane in the home and community. Patient has meals on wheels for all meals per her report, but does report she needs help with community errands at this time.   PT Goals (current goals can now be found in the care plan section) Acute Rehab PT Goals Patient Stated Goal: to go home PT Goal Formulation: With patient Time For Goal Achievement: 11/16/20 Potential to Achieve Goals: Fair    Frequency    Min 2X/week      PT Plan      Co-evaluation              AM-PAC PT "6 Clicks" Mobility   Outcome Measure  Help needed turning from your back to your side while in a flat bed without using bedrails?: None Help needed moving from lying on your back to sitting on the side of a flat bed without using bedrails?: None Help needed moving to and from a bed to a chair (including a wheelchair)?: A Little Help needed standing up from a chair using your arms (e.g., wheelchair or bedside chair)?: A Little Help needed to walk in hospital room?: A Little Help needed climbing 3-5 steps with a railing? : A Lot 6 Click Score: 19    End of Session Equipment Utilized During Treatment: Gait belt Activity Tolerance: Patient limited by fatigue Patient left: with call bell/phone within reach;in bed Nurse Communication: Mobility status PT Visit Diagnosis: Muscle weakness (generalized) (M62.81);Unsteadiness on feet (R26.81)     Time: 0128-0150 PT Time Calculation (min) (ACUTE ONLY): 22 min  Charges:  $Therapeutic Activity: 8-22 mins                     Durwin Reges DPT   Durwin Reges 11/02/2020, 2:53 PM

## 2020-11-02 NOTE — Progress Notes (Addendum)
Pt was admitted ont eh floor with no signs of ditress. Pt alert x 4. VSS. Ascom within pt reach and educateda bout safety. Pt requested for purewick. purewick was place on pt. Pt was also complain of stabbing pain 6/10. Notify NP Ouma. Will continue to monitor.  Update 0041: Unalbe to obatined RedsVest. [place] not connecting to internet. Pt requested to do Heart failure education after breakfast. Will notify incoming shift. Will continue to monitor.  Update 0052 Ouma placed ordered flexeril tablet 5 mg 2 times daily PRN. Will continue for muscle spasm. Will continue to monitor.  Update 0250: rechecked pt blood sugar at 316. Notify NP Ouma. Will continue to monitor.  Update 0354: Per NP Ouma no order to cover the 316 blood sugar but rechecked blood sugar at 0600. Will continue to monitor.  Update 0622: Pt blood sugar at 349. Notify NP Ouma. Will continue to monitor.  Update [phone]: Talked to NP Ouma and no new order place. Will notify incoming shift. Will continue to monitor.

## 2020-11-02 NOTE — Progress Notes (Signed)
Pt reporting unable to move bowels for couple of days. Notify NP OUma and ordered dulcolax EC 10 mg tablet oral once. Will continue to monitor.

## 2020-11-03 DIAGNOSIS — R079 Chest pain, unspecified: Secondary | ICD-10-CM | POA: Diagnosis not present

## 2020-11-03 LAB — BASIC METABOLIC PANEL
Anion gap: 11 (ref 5–15)
BUN: 55 mg/dL — ABNORMAL HIGH (ref 6–20)
CO2: 25 mmol/L (ref 22–32)
Calcium: 8.3 mg/dL — ABNORMAL LOW (ref 8.9–10.3)
Chloride: 105 mmol/L (ref 98–111)
Creatinine, Ser: 3.54 mg/dL — ABNORMAL HIGH (ref 0.44–1.00)
GFR, Estimated: 14 mL/min — ABNORMAL LOW (ref >60)
Glucose, Bld: 345 mg/dL — ABNORMAL HIGH (ref 70–99)
Potassium: 3.3 mmol/L — ABNORMAL LOW (ref 3.5–5.1)
Sodium: 141 mmol/L (ref 135–145)

## 2020-11-03 LAB — PROTEIN / CREATININE RATIO, URINE
Creatinine, Urine: 31 mg/dL
Protein Creatinine Ratio: 15.52 mg/mg{Cre} — ABNORMAL HIGH (ref 0.00–0.15)
Total Protein, Urine: 481 mg/dL

## 2020-11-03 LAB — MAGNESIUM: Magnesium: 2.2 mg/dL (ref 1.7–2.4)

## 2020-11-03 LAB — GLUCOSE, CAPILLARY
Glucose-Capillary: 319 mg/dL — ABNORMAL HIGH (ref 70–99)
Glucose-Capillary: 232 mg/dL — ABNORMAL HIGH (ref 70–99)
Glucose-Capillary: 314 mg/dL — ABNORMAL HIGH (ref 70–99)
Glucose-Capillary: 251 mg/dL — ABNORMAL HIGH (ref 70–99)

## 2020-11-03 MED ORDER — POTASSIUM CHLORIDE CRYS ER 20 MEQ PO TBCR
40.0000 meq | EXTENDED_RELEASE_TABLET | Freq: Two times a day (BID) | ORAL | Status: DC
  Administered 2020-11-03 – 2020-11-06 (×7): 40 meq via ORAL
  Filled 2020-11-03 (×7): qty 2

## 2020-11-03 MED ORDER — INSULIN GLARGINE 100 UNIT/ML ~~LOC~~ SOLN
10.0000 [IU] | Freq: Once | SUBCUTANEOUS | Status: AC
  Administered 2020-11-03: 10 [IU] via SUBCUTANEOUS
  Filled 2020-11-03: qty 0.1

## 2020-11-03 MED ORDER — INSULIN ASPART 100 UNIT/ML ~~LOC~~ SOLN
0.0000 [IU] | Freq: Every day | SUBCUTANEOUS | Status: DC
  Administered 2020-11-03: 3 [IU] via SUBCUTANEOUS
  Administered 2020-11-06 – 2020-11-08 (×2): 2 [IU] via SUBCUTANEOUS
  Filled 2020-11-03 (×3): qty 1

## 2020-11-03 MED ORDER — INSULIN ASPART 100 UNIT/ML ~~LOC~~ SOLN
0.0000 [IU] | Freq: Three times a day (TID) | SUBCUTANEOUS | Status: DC
  Administered 2020-11-03: 15 [IU] via SUBCUTANEOUS
  Administered 2020-11-04: 11 [IU] via SUBCUTANEOUS
  Administered 2020-11-04: 7 [IU] via SUBCUTANEOUS
  Administered 2020-11-05 (×2): 3 [IU] via SUBCUTANEOUS
  Administered 2020-11-05 – 2020-11-06 (×2): 4 [IU] via SUBCUTANEOUS
  Administered 2020-11-06 – 2020-11-07 (×3): 3 [IU] via SUBCUTANEOUS
  Administered 2020-11-08 – 2020-11-09 (×3): 4 [IU] via SUBCUTANEOUS
  Administered 2020-11-09 – 2020-11-11 (×3): 3 [IU] via SUBCUTANEOUS
  Administered 2020-11-12: 11 [IU] via SUBCUTANEOUS
  Filled 2020-11-03 (×18): qty 1

## 2020-11-03 MED ORDER — ALBUMIN HUMAN 25 % IV SOLN
25.0000 g | Freq: Three times a day (TID) | INTRAVENOUS | Status: DC
  Administered 2020-11-03 – 2020-11-07 (×13): 25 g via INTRAVENOUS
  Filled 2020-11-03 (×13): qty 100

## 2020-11-03 MED ORDER — INSULIN GLARGINE 100 UNIT/ML ~~LOC~~ SOLN: 30.0000 [IU] | Freq: Every day | SUBCUTANEOUS | Status: DC

## 2020-11-03 MED ORDER — INSULIN ASPART 100 UNIT/ML ~~LOC~~ SOLN
4.0000 [IU] | Freq: Three times a day (TID) | SUBCUTANEOUS | Status: DC
  Administered 2020-11-03 – 2020-11-04 (×3): 4 [IU] via SUBCUTANEOUS
  Filled 2020-11-03 (×3): qty 1

## 2020-11-03 MED ORDER — INSULIN GLARGINE 100 UNIT/ML ~~LOC~~ SOLN
40.0000 [IU] | Freq: Every day | SUBCUTANEOUS | Status: DC
  Administered 2020-11-04: 40 [IU] via SUBCUTANEOUS
  Filled 2020-11-03: qty 0.4

## 2020-11-03 NOTE — Progress Notes (Addendum)
  Heart Failure Nurse Navigator Note  HFrEF 30% by echocardiogram  02/2020.  Normal right ventricular systolic function.  Mild MR.  Mild TR.   She presented to the ED with complaints of SOB, chest pain and lower extremity edema.    Co morbidities:  Diabetes type 2 Hypertension Coronary artery disease Anemia COPD CKD stage IV-V Hyperlipidemia OSA, not using CPAP   Medications:  Imdur 30 mg daily Fenofibrate 160 mg daily Norvasc 10 mg daily ASA 81 mg daily Lipitor 80 mg daily Coreg 25 mg BID  Lasix infusion  at 6 mg an hour.  Labs:  Sodium 141, potassium 3.3, BUN 55 (52), creatinine 3.54 (3.36), Magnesium 2.2.  Intake 1,120 ml Output 3,700 ml  Weight 85.7 kg (84.4)  Reds clip reading on admission 35. BNP 1306  BP 162/88 p 72   Assessment:  General- she is awake and alert, sitting up in chair at bedside.  No acute distress.  HEENT- no JVD, normocephalic.  Cardiac- heart tones of regular rate and rhythm.  Chest- breath sounds clear with fine crackles in the bases.  Abdomen rounded soft, non tender.  Musculoskeletal- lower extremities with 1+ pitting edema.  Neuro- speech is clear,  Psych is pleasant and appropriate.   Discussed with patient how much  she had drank after she was discharged on 11/9 with a fluid restriction.  She listed 4-8 ounce glasses of cranberry juice, 4-8 ounce glassed of water, and 2 cups of tea daily.  Talked about limit that she is on now and if she were to be discharged on that she could have no more than 5- 8ounces glasses per 24 hour period. Discussed keeping a diary to keep tract of what she is taking in.  She voices understanding.    Also talked about weighing daily, she says for one thing she can not see the digital readings and another that her battery is dead.  Was able to give her a talking scale.  She started to cry.  Discussed with her nurse, if staff on nights could weigh her on it tonight for her to get used  to.  Also talked with nurse about her output and her weight.  She put out almost 4 liters and weight went up 1.3 kg or 2.86 pounds.   Offered her the heart failure video's- she said she would like to see those, started with What heart failure is and Water pill and fluids.  She will look at more tomorrow.   Will continue to follow.  Pricilla Riffle RN, CHFN

## 2020-11-03 NOTE — TOC Initial Note (Signed)
Transition of Care [facility]) - Initial/Assessment Note    Patient Details  Name: Terry Arroyo MRN: [id number] Date of Birth: 03-02-1961  Transition of Care [facility]) CM/SW Contact:    Kerin Salen, RN Phone Number: 11/03/2020, 1:55 PM  Clinical Narrative: CM met with patient in room. Patient states she lives alone, new to the area, blind in left eye since age 59. Uses [employer] delivery medication service and [employer] delivery service. Uses rolling walker and cane for ambulation. PCP is Dr. Ouida Sills at the [facility]. Refuses SNF says she's been living with one eye since age 21, however receptive to [facility], states few weeks ago she was scheduled for services however when called to inquire about starting date, was informed that she was on the waiting list. Per unit Nurse patient will receive [facility] services with [facility]. I did call to confirm, no answer left message to return call.                 Expected Discharge Plan: [place] Barriers to Discharge: Continued Medical Work up   Patient Goals and CMS Choice Patient states their goals for this hospitalization and ongoing recovery are:: To go home with home health services      Expected Discharge Plan and Services Expected Discharge Plan: [place] Choice: [place] arrangements for the past 2 months: Apartment                           HH Arranged: RN, Nurse's Aide, PT, OT HH Agency: [facility] Date [place]: 11/03/20 Time [place]: [place] Representative spoke with at [place]: [place] Arrangements/Services Living arrangements for the past 2 months: [place] with:: Self Patient language and need for interpreter reviewed:: Yes Do you feel safe going back to the place where you live?: Yes      Need for Family Participation in Patient Care: No (Comment) Care giver support system in  place?: No (comment)   Criminal Activity/Legal Involvement Pertinent to Current Situation/Hospitalization: No - Comment as needed  Activities of Daily Living Home Assistive Devices/Equipment: Cane (specify quad or straight), Walker (specify type) (straight cane; 4 wheels walker) ADL Screening (condition at time of admission) Patient's cognitive ability adequate to safely complete daily activities?: Yes Is the patient deaf or have difficulty hearing?: No Does the patient have difficulty seeing, even when wearing glasses/contacts?: No Does the patient have difficulty concentrating, remembering, or making decisions?: No Patient able to express need for assistance with ADLs?: Yes Does the patient have difficulty dressing or bathing?: Yes Independently performs ADLs?: No Communication: Independent Dressing (OT): Needs assistance Is this a change from baseline?: Pre-admission baseline Grooming: Independent Is this a change from baseline?: Pre-admission baseline Feeding: Independent Bathing: Needs assistance Is this a change from baseline?: Pre-admission baseline Toileting: Needs assistance Is this a change from baseline?: Pre-admission baseline Walks in Home: Needs assistance Is this a change from baseline?: Pre-admission baseline Does the patient have difficulty walking or climbing stairs?: Yes Weakness of Legs: Both Weakness of Arms/Hands: None  Permission Sought/Granted                  Emotional Assessment Appearance:: Appears stated age Attitude/Demeanor/Rapport: Engaged Affect (typically observed): Accepting, Appropriate Orientation: : Oriented to Self, Oriented to Place, Oriented to  Time, Oriented to Situation Alcohol /  Substance Use: Not Applicable Psych Involvement: No (comment)  Admission diagnosis:  CN (constipation) [K59.00] Hyperglycemia [R73.9] Elevated troponin [R77.8] Chest pain [R07.9] Renal failure, unspecified chronicity [N19] Patient Active Problem  List   Diagnosis Date Noted  . SOB (shortness of breath) 11/01/2020  . Acute on chronic systolic congestive heart failure ([place]) 10/21/2020  . CAD (coronary artery disease) 10/21/2020  . Hyperlipidemia associated with type 2 diabetes mellitus ([place]) 10/21/2020  . HFrEF (heart failure with reduced ejection fraction) ([place])   . Acute respiratory distress   . COPD exacerbation ([place]) 10/10/2020  . Kidney hematoma 09/13/2020  . Hypertension associated with diabetes ([place])   . AKI (acute kidney injury) ([place])   . Hematuria 09/12/2020  . Calf tenderness   . Acute on chronic heart failure ([place]) 02/27/2020  . Leg edema   . DM2 (diabetes mellitus, type 2) ([place]) 02/26/2020  . CKD (chronic kidney disease) stage 4, GFR 15-29 ml/min (HCC) 02/26/2020  . Hyponatremia 02/26/2020  . Anasarca 02/26/2020  . Proteinuria 02/26/2020  . Hypoalbuminemia 02/26/2020  . Elevated troponin 02/26/2020  . Acute on chronic systolic CHF (congestive heart failure) ([place]) 02/26/2020  . Hyperglycemia   . Nausea   . Hypertensive emergency   . Hypertensive urgency 11/24/2019  . Chest pain 11/23/2019  . OSA (obstructive sleep apnea) 04/10/2019   PCP:  Kirk Ruths, MD Pharmacy:   [facility] [address], [place] - [place] [address] [place] [address] Phone: [phone] Fax: [facility] - [place], [place] [address] Phone: [phone] Fax: [phone]  [place] [address]), [place] - [place]) [place] [address] Phone: [phone] Fax: [phone]     Social Determinants of Health (SDOH) Interventions    Readmission Risk Interventions Readmission Risk Prevention Plan 11/03/2020 10/23/2020 10/12/2020  Transportation Screening Complete Complete Complete  Medication Review (RN Care Manager) Complete Complete Complete  PCP or  Specialist appointment within 3-5 days of discharge Not Complete Complete Complete  PCP/Specialist Appt Not Complete comments To be scheduled by unit secretary. - -  HRI or Home Care Consult Complete Complete -  SW Recovery Care/Counseling Consult Complete Complete Complete  Palliative Care Screening Not Applicable Not Applicable Not Applicable  Skilled Nursing Facility Patient Refused Not Applicable Not Complete  SNF Comments Patient refuses states she is able to take care of herself. - Patient pending evaluation  Some recent data might be hidden

## 2020-11-03 NOTE — Progress Notes (Signed)
[facility]  ROUNDING NOTE   Subjective:  Patient with good urine output of 3.7 L yesterday. Edema status improved.   Objective:  Vital signs in last 24 hours:  Temp:  [98.1 F (36.7 C)-98.7 F (37.1 C)] 98.1 F (36.7 C) (11/15 1155) Pulse Rate:  [65-77] 65 (11/15 1155) Resp:  [11-25] 18 (11/15 1155) BP: (141-179)/(69-88) 152/77 (11/15 1155) SpO2:  [95 %-100 %] 98 % (11/15 1155) Weight:  [85.7 kg] 85.7 kg (11/15 0348)  Weight change: -2.27 kg Filed Weights   11/02/20 0033 11/02/20 0400 11/03/20 0348  Weight: 84.4 kg 84.4 kg 85.7 kg    Intake/Output: I/O last 3 completed shifts: In: 2480.3 [P.O.:1198; I.V.:86.3; IV Piggyback:1196] Out: 8676 [Urine:4025]   Intake/Output this shift:  Total I/O In: 772 [P.O.:720; I.V.:30; IV Piggyback:22] Out: 850 [Urine:850]  Physical Exam: General:  No acute distress  Head:  Normocephalic, atraumatic. Moist oral mucosal membranes  Eyes:  Anicteric  Neck:  Supple  Lungs:   Clear to auscultation, normal effort  Heart:  S1S2 no rubs  Abdomen:   Soft, nontender, bowel sounds present  Extremities:  1+ peripheral edema.  Neurologic:  Awake, alert, following commands  Skin:  No lesions  Access:  No hemodialysis access    Basic Metabolic Panel: Recent Labs  Lab 10/28/20 0404 10/28/20 0404 11/01/20 1520 11/01/20 2323 11/02/20 0935 11/03/20 0541  NA 132*  --  134*  --  139 141  K 3.4*  --  3.0*  --  3.4* 3.3*  CL 99  --  101  --  107 105  CO2 24  --  22  --  23 25  GLUCOSE 310*  --  520*  --  343* 345*  BUN 77*  --  60*  --  52* 55*  CREATININE 3.84*  --  3.30* 3.14* 3.36* 3.54*  CALCIUM 8.0*   < > 8.0*  --  7.9* 8.3*  MG  --   --   --   --  1.7 2.2   < > = values in this interval not displayed.    Liver Function Tests: No results for input(s): AST, ALT, ALKPHOS, BILITOT, PROT, ALBUMIN in the last 168 hours. No results for input(s): LIPASE, AMYLASE in the last 168 hours. No results for input(s): AMMONIA in  the last 168 hours.  CBC: Recent Labs  Lab 10/28/20 0404 11/01/20 1520  WBC 10.0 11.7*  HGB 8.6* 9.1*  HCT 26.4* 28.0*  MCV 88.3 89.5  PLT 486* 469*    Cardiac Enzymes: No results for input(s): CKTOTAL, CKMB, CKMBINDEX, TROPONINI in the last 168 hours.  BNP: Invalid input(s): POCBNP  CBG: Recent Labs  Lab 11/02/20 1159 11/02/20 1656 11/02/20 2114 11/03/20 0812 11/03/20 1154  GLUCAP 281* 240* 289* 319* 232*    Microbiology: Results for orders placed or performed during the hospital encounter of 11/01/20  Respiratory Panel by RT PCR (Flu A&B, Covid) - Nasopharyngeal Swab     Status: None   Collection Time: 11/01/20  9:18 PM   Specimen: Nasopharyngeal Swab  Result Value Ref Range Status   SARS Coronavirus 2 by RT PCR NEGATIVE NEGATIVE Final    Comment: (NOTE) SARS-CoV-2 target nucleic acids are NOT DETECTED.  The SARS-CoV-2 RNA is generally detectable in upper respiratoy specimens during the acute phase of infection. The lowest concentration of SARS-CoV-2 viral copies this assay can detect is 131 copies/mL. A negative result does not preclude SARS-Cov-2 infection and should not be used as the sole  basis for treatment or other patient management decisions. A negative result may occur with  improper specimen collection/handling, submission of specimen other than nasopharyngeal swab, presence of viral mutation(s) within the areas targeted by this assay, and inadequate number of viral copies (<131 copies/mL). A negative result must be combined with clinical observations, patient history, and epidemiological information. The expected result is Negative.  Fact Sheet for Patients:  PinkCheek.be  Fact Sheet for Healthcare Providers:  GravelBags.it  This test is no t yet approved or cleared by the [place] FDA and  has been authorized for detection and/or diagnosis of SARS-CoV-2 by FDA under an Emergency  Use Authorization (EUA). This EUA will remain  in effect (meaning this test can be used) for the duration of the COVID-19 declaration under Section 564(b)(1) of the Act, 21 U.S.C. section 360bbb-3(b)(1), unless the authorization is terminated or revoked sooner.     Influenza A by PCR NEGATIVE NEGATIVE Final   Influenza B by PCR NEGATIVE NEGATIVE Final    Comment: (NOTE) The Xpert Xpress SARS-CoV-2/FLU/RSV assay is intended as an aid in  the diagnosis of influenza from Nasopharyngeal swab specimens and  should not be used as a sole basis for treatment. Nasal washings and  aspirates are unacceptable for Xpert Xpress SARS-CoV-2/FLU/RSV  testing.  Fact Sheet for Patients: PinkCheek.be  Fact Sheet for Healthcare Providers: GravelBags.it  This test is not yet approved or cleared by the [place] FDA and  has been authorized for detection and/or diagnosis of SARS-CoV-2 by  FDA under an Emergency Use Authorization (EUA). This EUA will remain  in effect (meaning this test can be used) for the duration of the  Covid-19 declaration under Section 564(b)(1) of the Act, 21  U.S.C. section 360bbb-3(b)(1), unless the authorization is  terminated or revoked. Performed at [facility], [address] [place], [place] [address]     Coagulation Studies: No results for input(s): LABPROT, INR in the last 72 hours.  Urinalysis: No results for input(s): COLORURINE, LABSPEC, PHURINE, GLUCOSEU, HGBUR, BILIRUBINUR, KETONESUR, PROTEINUR, UROBILINOGEN, NITRITE, LEUKOCYTESUR in the last 72 hours.  Invalid input(s): APPERANCEUR    Imaging: DG Chest 2 View  Result Date: 11/01/2020 CLINICAL DATA:  Chest pain and shortness of breath EXAM: CHEST - 2 VIEW COMPARISON:  October 21, 2020 FINDINGS: There is mild bibasilar atelectasis. There is no edema or airspace opacity. Heart is mildly enlarged with pulmonary vascularity normal.  Patient is status post internal mammary bypass grafting. No adenopathy. No bone lesions. No pneumothorax. IMPRESSION: Cardiomegaly with pulmonary vascularity normal. Postoperative changes. Mild bibasilar atelectasis. No edema or airspace opacity. Electronically Signed   By: Lowella Grip III M.D.   On: 11/01/2020 15:51   DG Abd 2 Views  Result Date: 11/01/2020 CLINICAL DATA:  Constipation EXAM: ABDOMEN - 2 VIEW COMPARISON:  September 12, 2020 FINDINGS: Air and stool-filled nondilated loops of bowel. Moderate colonic stool burden predominately in the LEFT colon. Surgical clips project over the heart. Status post median sternotomy. Small bilateral pleural effusions. Vascular calcifications. Chondrocalcinosis of the pubic symphysis. IMPRESSION: Nonobstructive bowel gas pattern. Moderate colonic stool burden, predominately in the LEFT colon. Electronically Signed   By: Valentino Saxon MD   On: 11/01/2020 17:58     Medications:    sodium chloride 250 mL (11/03/20 0034)   albumin human 25 g (11/03/20 0946)   furosemide (LASIX) 200 mg in dextrose 5% 100 mL (2mg /mL) infusion 6 mg/hr (11/03/20 0713)    amLODipine  10 mg Oral Daily  aspirin EC  81 mg Oral Daily   atorvastatin  80 mg Oral q1800   carvedilol  25 mg Oral BID WC   famotidine  10 mg Oral Daily   fenofibrate  160 mg Oral q1800   heparin  5,000 Units Subcutaneous Q8H   insulin aspart  0-20 Units Subcutaneous TID WC   insulin aspart  0-5 Units Subcutaneous QHS   insulin aspart  4 Units Subcutaneous TID WC   [START ON 11/04/2020] insulin glargine  40 Units Subcutaneous Daily   isosorbide mononitrate  30 mg Oral Daily   mirtazapine  30 mg Oral QHS   mometasone-formoterol  2 puff Inhalation BID   potassium chloride  40 mEq Oral BID   prednisoLONE acetate  1 drop Right Eye QID   predniSONE  20 mg Oral Q breakfast   sodium bicarbonate  1,300 mg Oral TID   sodium chloride flush  3 mL Intravenous Q12H    topiramate  25 mg Oral Daily   cyanocobalamin  1,000 mcg Oral Daily   sodium chloride, albuterol, cyclobenzaprine, sodium chloride flush  Assessment/ Plan:  59 y.o. female with past medical history of congestive heart failure, diabetes mellitus type 2 with chronic kidney disease, hypertension, hyperlipidemia, chronic kidney disease stage IV with diabetic nephropathy and interstitial nephritis, history of brain tumor, left eye blindness who presented with increasing shortness of breath and lower extremity edema.  1.  Diabetes mellitus type 2 with chronic kidney disease biopsy-proven/chronic kidney disease stage IV/proteinuria/history of mild interstitial nephritis.  Patient had biopsy performed back in September.  This showed some mild hypersensitivity reaction along with a background of diabetic nephropathy.  Reports having increasing lower extremity edema and shortness of breath.  Suspect that she is volume overloaded from nephrotic syndrome.   -Patient did have good response to diuretics.  Urine output was 3.7 L yesterday.  Continue diuresis at this time.  Continue albumin as well.  2.  Anemia of chronic kidney disease.  No immediate need for Epogen but will likely need this as an outpatient.  3.  Metabolic acidosis.  Serum bicarbonate acceptable at 25.  Continue periodically monitor.  Maintain the patient on sodium bicarbonate repletion.     LOS: 2 Emmagene Ortner 11/15/20212:11 PM

## 2020-11-03 NOTE — Progress Notes (Addendum)
PROGRESS NOTE    Terry Arroyo  UMP:[id number] DOB: 1961/10/30 DOA: 11/01/2020 PCP: Kirk Ruths, MD  Outpatient Specialists: [facility]    Brief Narrative:   From admitter h & p: Terry Arroyo is a 59 y.o. female with medical history significant of dm ii, htn, cva, cad seen in ed for sob and le edema. Chart review has march echo which shows moderately decreased LV function with ef of 30%, different from 55-60 in 11/2019. She was recently discharged in October 2021 for similar presentation  and is returning to [place] for not feeling well with generalized complaints of chest pain sob and le edema..  I do not see  cardiology consult note pt, reports she does see cardiologist in addition to nephrologist as well.. Pt lives alone. Pt is retired, pt sees a Production designer, theatre/television/film. Pt states that she does not think she wants snf options but needs someone at home for cleaning and cooking etc.[facility] was started on her case last admission but pt was on wait list.   Assessment & Plan:   Principal Problem:   Chest pain Active Problems:   DM2 (diabetes mellitus, type 2) (HCC)   Leg edema   COPD exacerbation (HCC)   SOB (shortness of breath)   # HFrEF with mild exacerbation # Chest pain # Coronary artery disease. Patient reports few days predominantly dyspnea with exertion worse than baseline. Here no hypoxia documented. Not requiring O2. Does have moderate LE pitting edema. CXR w/o overt signs vascular congestion. EF 30-35 earlier this year. Troponins mildly elevated and flat, chest pain has subsided this morning, this in setting of sig ckd, ekg non-ischemic. Hx multi--vessel cabg 2018. Nephrotic syndrome likely playing a significant role here. uop last 24 excellent, neg neg 2500 last 24 hours - daily weights, strict I/os - hold home torsemide 10, given nephrotic syndrome IV albumin ordered by nephrology with lasix gtt - cont aspirin, atorvastatin, carvedilol, imdur; will leave  further evidence-based therapy additions to cardiology - cardiology following, appreciate recs  # COPD  Appears more or less quiescent - cont home dulera, albuterol prn  # Debility - PT/OT consult  # Hypokalemia  k 3.3 today, mg wnl. Aggressive diuresis as above - repeat k, mg - daily kcl 40 increase to bid  # CKD 4/5 Renal biopsy from September 11, 2020 shows acute tubular injury with mild acute interstitial nephritis suggestive of a hypersensitivity etiology.  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 GFR of about 15 seems to be new baseline. Per nephro likely nephrotic syndrome - cont home prednisone 20 qd, sodium bicarb - nephrology consulted - f/u upc    # HTN - here bp moderately elevated - cont home amlodipine, coreg, imdur - lasix as above, if bp doesn't improve w/ diuresis consider additional agents  # Anemia of chronic kidney disease Stable  # T2DM - here gluocose elevated to 300s - hold home 70/30 - increase lantus to 40 qd, add novolog 4 w/ meals - dm coordinator following, appreciate recs - SSI resistant  # Constipation - colace  # OSA Not on cpap at home   DVT prophylaxis: heparin Code Status: full Family Communication: none @ bedside  Status is: Inpatient  Remains inpatient appropriate because:Inpatient level of care appropriate due to severity of illness   Dispo: The patient is from: Home              Anticipated d/c is to: home  w/ home health (PT has seen, home health orders have been placed)              Anticipated d/c date is: 2-3 days              Patient currently is not medically stable to d/c.        Consultants:  Cardiology, nephrology  Procedures: none  Antimicrobials:  none    Subjective: No chest pain or sob. Has appetite. No vomiting. No cough.   Objective: Vitals:   11/03/20 0348 11/03/20 0357 11/03/20 0810 11/03/20  1155  BP:  (!) 179/86 (!) 162/88 (!) 152/77  Pulse:  65 65 65  Resp:  15 16 18   Temp:  98.4 F (36.9 C) 98.3 F (36.8 C) 98.1 F (36.7 C)  TempSrc:  Oral Oral Oral  SpO2:  98% 100% 98%  Weight: 85.7 kg     Height:        Intake/Output Summary (Last 24 hours) at 11/03/2020 1207 Last data filed at 11/03/2020 1000 Gross per 24 hour  Intake 1652.33 ml  Output 3800 ml  Net -2147.67 ml   Filed Weights   11/02/20 0033 11/02/20 0400 11/03/20 0348  Weight: 84.4 kg 84.4 kg 85.7 kg    Examination:  General exam: Appears calm and comfortable  Respiratory system: ctab Cardiovascular system: S1 & S2 heard, RRR. Soft systolic murmur Gastrointestinal system: Abdomen is nondistended, soft and nontender. No organomegaly or masses felt. Normal bowel sounds heard. Central nervous system: Alert and oriented. No focal neurological deficits. Extremities: Symmetric 5 x 5 power. Skin: No rashes. Moderate pitting edema improved  Psychiatry: speech a bit slowed, affect flat    Data Reviewed: I have personally reviewed following labs and imaging studies  CBC: Recent Labs  Lab 10/28/20 0404 11/01/20 1520  WBC 10.0 11.7*  HGB 8.6* 9.1*  HCT 26.4* 28.0*  MCV 88.3 89.5  PLT 486* 622*   Basic Metabolic Panel: Recent Labs  Lab 10/28/20 0404 11/01/20 1520 11/01/20 2323 11/02/20 0935 11/03/20 0541  NA 132* 134*  --  139 141  K 3.4* 3.0*  --  3.4* 3.3*  CL 99 101  --  107 105  CO2 24 22  --  23 25  GLUCOSE 310* 520*  --  343* 345*  BUN 77* 60*  --  52* 55*  CREATININE 3.84* 3.30* 3.14* 3.36* 3.54*  CALCIUM 8.0* 8.0*  --  7.9* 8.3*  MG  --   --   --  1.7 2.2   GFR: Estimated Creatinine Clearance: 19.1 mL/min (A) (by C-G formula based on SCr of 3.54 mg/dL (H)). Liver Function Tests: No results for input(s): AST, ALT, ALKPHOS, BILITOT, PROT, ALBUMIN in the last 168 hours. No results for input(s): LIPASE, AMYLASE in the last 168 hours. No results for input(s): AMMONIA in the last  168 hours. Coagulation Profile: No results for input(s): INR, PROTIME in the last 168 hours. Cardiac Enzymes: No results for input(s): CKTOTAL, CKMB, CKMBINDEX, TROPONINI in the last 168 hours. BNP (last 3 results) No results for input(s): PROBNP in the last 8760 hours. HbA1C: No results for input(s): HGBA1C in the last 72 hours. CBG: Recent Labs  Lab 11/02/20 1159 11/02/20 1656 11/02/20 2114 11/03/20 0812 11/03/20 1154  GLUCAP 281* 240* 289* 319* 232*   Lipid Profile: No results for input(s): CHOL, HDL, LDLCALC, TRIG, CHOLHDL, LDLDIRECT in the last 72 hours. Thyroid Function Tests: Recent Labs    11/01/20 2323  TSH 3.905  FREET4 0.88   Anemia Panel: Recent Labs    11/01/20 2323  VITAMINB12 176*  FOLATE 8.9  FERRITIN 166  TIBC 176*  IRON 33  RETICCTPCT 2.8   Urine analysis:    Component Value Date/Time   COLORURINE STRAW (A) 10/21/2020 0829   APPEARANCEUR CLEAR (A) 10/21/2020 0829   LABSPEC 1.007 10/21/2020 0829   PHURINE 7.0 10/21/2020 0829   GLUCOSEU 150 (A) 10/21/2020 0829   HGBUR NEGATIVE 10/21/2020 0829   BILIRUBINUR NEGATIVE 10/21/2020 0829   KETONESUR NEGATIVE 10/21/2020 0829   PROTEINUR >=300 (A) 10/21/2020 0829   NITRITE NEGATIVE 10/21/2020 0829   LEUKOCYTESUR SMALL (A) 10/21/2020 0829   Sepsis Labs: @LABRCNTIP (procalcitonin:4,lacticidven:4)  ) Recent Results (from the past 240 hour(s))  Respiratory Panel by RT PCR (Flu A&B, Covid) - Nasopharyngeal Swab     Status: None   Collection Time: 11/01/20  9:18 PM   Specimen: Nasopharyngeal Swab  Result Value Ref Range Status   SARS Coronavirus 2 by RT PCR NEGATIVE NEGATIVE Final    Comment: (NOTE) SARS-CoV-2 target nucleic acids are NOT DETECTED.  The SARS-CoV-2 RNA is generally detectable in upper respiratoy specimens during the acute phase of infection. The lowest concentration of SARS-CoV-2 viral copies this assay can detect is 131 copies/mL. A negative result does not preclude  SARS-Cov-2 infection and should not be used as the sole basis for treatment or other patient management decisions. A negative result may occur with  improper specimen collection/handling, submission of specimen other than nasopharyngeal swab, presence of viral mutation(s) within the areas targeted by this assay, and inadequate number of viral copies (<131 copies/mL). A negative result must be combined with clinical observations, patient history, and epidemiological information. The expected result is Negative.  Fact Sheet for Patients:  PinkCheek.be  Fact Sheet for Healthcare Providers:  GravelBags.it  This test is no t yet approved or cleared by the [place] FDA and  has been authorized for detection and/or diagnosis of SARS-CoV-2 by FDA under an Emergency Use Authorization (EUA). This EUA will remain  in effect (meaning this test can be used) for the duration of the COVID-19 declaration under Section 564(b)(1) of the Act, 21 U.S.C. section 360bbb-3(b)(1), unless the authorization is terminated or revoked sooner.     Influenza A by PCR NEGATIVE NEGATIVE Final   Influenza B by PCR NEGATIVE NEGATIVE Final    Comment: (NOTE) The Xpert Xpress SARS-CoV-2/FLU/RSV assay is intended as an aid in  the diagnosis of influenza from Nasopharyngeal swab specimens and  should not be used as a sole basis for treatment. Nasal washings and  aspirates are unacceptable for Xpert Xpress SARS-CoV-2/FLU/RSV  testing.  Fact Sheet for Patients: PinkCheek.be  Fact Sheet for Healthcare Providers: GravelBags.it  This test is not yet approved or cleared by the [place] FDA and  has been authorized for detection and/or diagnosis of SARS-CoV-2 by  FDA under an Emergency Use Authorization (EUA). This EUA will remain  in effect (meaning this test can be used) for the duration of the   Covid-19 declaration under Section 564(b)(1) of the Act, 21  U.S.C. section 360bbb-3(b)(1), unless the authorization is  terminated or revoked. Performed at [facility], [address], [place], [place] [address]          Radiology Studies: DG Chest 2 View  Result Date: 11/01/2020 CLINICAL DATA:  Chest pain and shortness of breath EXAM: CHEST - 2 VIEW COMPARISON:  October 21, 2020 FINDINGS: There is mild bibasilar atelectasis. There is  no edema or airspace opacity. Heart is mildly enlarged with pulmonary vascularity normal. Patient is status post internal mammary bypass grafting. No adenopathy. No bone lesions. No pneumothorax. IMPRESSION: Cardiomegaly with pulmonary vascularity normal. Postoperative changes. Mild bibasilar atelectasis. No edema or airspace opacity. Electronically Signed   By: Lowella Grip III M.D.   On: 11/01/2020 15:51   DG Abd 2 Views  Result Date: 11/01/2020 CLINICAL DATA:  Constipation EXAM: ABDOMEN - 2 VIEW COMPARISON:  September 12, 2020 FINDINGS: Air and stool-filled nondilated loops of bowel. Moderate colonic stool burden predominately in the LEFT colon. Surgical clips project over the heart. Status post median sternotomy. Small bilateral pleural effusions. Vascular calcifications. Chondrocalcinosis of the pubic symphysis. IMPRESSION: Nonobstructive bowel gas pattern. Moderate colonic stool burden, predominately in the LEFT colon. Electronically Signed   By: Valentino Saxon MD   On: 11/01/2020 17:58        Scheduled Meds: . amLODipine  10 mg Oral Daily  . aspirin EC  81 mg Oral Daily  . atorvastatin  80 mg Oral q1800  . carvedilol  25 mg Oral BID WC  . famotidine  10 mg Oral Daily  . fenofibrate  160 mg Oral q1800  . heparin  5,000 Units Subcutaneous Q8H  . insulin aspart  0-20 Units Subcutaneous TID WC  . insulin aspart  0-5 Units Subcutaneous QHS  . insulin glargine  10 Units Subcutaneous Once  . [START ON 11/04/2020] insulin  glargine  30 Units Subcutaneous Daily  . isosorbide mononitrate  30 mg Oral Daily  . mirtazapine  30 mg Oral QHS  . mometasone-formoterol  2 puff Inhalation BID  . prednisoLONE acetate  1 drop Right Eye QID  . predniSONE  20 mg Oral Q breakfast  . sodium bicarbonate  1,300 mg Oral TID  . sodium chloride flush  3 mL Intravenous Q12H  . topiramate  25 mg Oral Daily  . cyanocobalamin  1,000 mcg Oral Daily   Continuous Infusions: . sodium chloride 250 mL (11/03/20 0034)  . albumin human 25 g (11/03/20 0946)  . furosemide (LASIX) 200 mg in dextrose 5% 100 mL (2mg /mL) infusion 6 mg/hr (11/03/20 0713)     LOS: 2 days    Time spent: 30 min    Desma Maxim, MD [facility]   If 7PM-7AM, please contact night-coverage [email or web address] Password [facility] 11/03/2020, 12:07 PM

## 2020-11-03 NOTE — Progress Notes (Addendum)
Inpatient Diabetes Program Recommendations  AACE/ADA: New Consensus Statement on Inpatient Glycemic Control (2015)  Target Ranges:  Prepandial:   less than 140 mg/dL      Peak postprandial:   less than 180 mg/dL (1-2 hours)      Critically ill patients:  140 - 180 mg/dL   Lab Results  Component Value Date   GLUCAP 319 (H) 11/03/2020   HGBA1C 8.6 (H) 10/11/2020    Review of Glycemic Control Results for Terry Arroyo, Terry Arroyo (MRN [id number]) as of 11/03/2020 11:54  Ref. Range 11/02/2020 02:41 11/02/2020 06:22 11/02/2020 08:42 11/02/2020 11:59 11/02/2020 16:56 11/02/2020 21:14 11/03/2020 08:12  Glucose-Capillary Latest Ref Range: 70 - 99 mg/dL 316 (H) 349 (H) 322 (H) 281 (H) 240 (H) 289 (H) 319 (H)   Diabetes history: DM 2 Outpatient Diabetes medications:  Novolog 70/30 mix 45 units bid Prednisone 20 mg daily (started on 11/10) Current orders for Inpatient glycemic control:  Lantus 20 units daily Novolog moderate tid with meals  Prednisone 20 mg daily Inpatient Diabetes Program Recommendations:    Please consider changing Lantus to 20 units bid. Also please add Novolog 4 units tid with meals.    Thanks,  Adah Perl, RN, BC-ADM Inpatient Diabetes Coordinator Pager [phone] (8a-5p)

## 2020-11-03 NOTE — Evaluation (Signed)
Occupational Therapy Evaluation Patient Details Name: Terry Arroyo MRN: [id number] DOB: 1961/10/21 Today's Date: 11/03/2020    History of Present Illness Terry Arroyo is a 59 y.o. female with medical history significant of dm ii, htn, cva, cad seen in ed for sob and le edema. Chart review has march echo which shows moderately decreased LV function with ef of 30%. She was recently discharged in October 2021 for similar presentation  and is returning to [place] for not feeling well with generalized complaints of chest pain sob and le edema..  I do not see  cardiology consult note pt, reports she does see cardiologist in addition to nephrologist as well. Patient lives alone in an apartment for seniors community. [facility] was started on her case last admission but pt was on wait list.   Clinical Impression   Pt was seen for OT evaluation this date. Prior to hospital admission, pt was independent with ADL, PRN AD use for mobility. Upon entry, pt requesting assist as purewick was malfunctioning. Upon further investigation, suction on canister wasn't working. Therapist corrected. Linens wet. Pt assisted with gown change and linens change. Pt performed bed mobility with supervision, provided with clean garments for LB dressing which she initiated over feet in standing with SBA/CGA and no overt LOB. Instructed to sit for portion of LB dressing to decrease risk of falls. Pt able to ambulate from bed to recliner with SBA/CGA. Pt endorsing fatigue after getting to recliner.   Currently pt demonstrates impairments as described below (See OT problem list) which functionally limit her ability to perform ADL/self-care tasks at baseline independence. Pt currently requires set up and supervision/SBA for STS ADL tasks and ADL mobility for safety. Pt fatigues quickly. VSS throughout. Pt would benefit from skilled OT services to address noted impairments and functional limitations (see below for any additional  details) in order to maximize safety and independence while minimizing falls risk and caregiver burden. Upon hospital discharge, recommend HHOT to maximize pt safety and return to functional independence during meaningful occupations of daily life.     Follow Up Recommendations  Home health OT;Supervision - Intermittent    Equipment Recommendations  Tub/shower seat    Recommendations for Other Services       Precautions / Restrictions Precautions Precautions: Fall Restrictions Weight Bearing Restrictions: No      Mobility Bed Mobility Overal bed mobility: Modified Independent             General bed mobility comments: increased time needed for LE negotiation, physically ind    Transfers Overall transfer level: Needs assistance Equipment used: None Transfers: Sit to/from Stand Sit to Stand: Supervision         General transfer comment: STS transfer for LB dressing, supervision for safety 2/2 higher level balance demands    Balance Overall balance assessment: Needs assistance Sitting-balance support: No upper extremity supported;Feet supported Sitting balance-Leahy Scale: Normal     Standing balance support: During functional activity;No upper extremity supported Standing balance-Leahy Scale: Fair                             ADL either performed or assessed with clinical judgement   ADL Overall ADL's : Needs assistance/impaired                                       General ADL  Comments: Supervision for safety for STS ADL for safety and activity tolerance/cardiopulmonary status     Vision Patient Visual Report: No change from baseline       Perception     Praxis      Pertinent Vitals/Pain Pain Assessment: No/denies pain     Hand Dominance Left   Extremity/Trunk Assessment Upper Extremity Assessment Upper Extremity Assessment: Generalized weakness   Lower Extremity Assessment Lower Extremity Assessment: Generalized  weakness       Communication Communication Communication: No difficulties   Cognition Arousal/Alertness: Awake/alert Behavior During Therapy: WFL for tasks assessed/performed Overall Cognitive Status: Within Functional Limits for tasks assessed                                     General Comments       Exercises Other Exercises Other Exercises: LB dressing, bed > recliner, cues for rest/recovery, VSS throughout   Shoulder Instructions      Home Living Family/patient expects to be discharged to:: Private residence Living Arrangements: Alone Available Help at Discharge: Family;Available PRN/intermittently Type of Home: Apartment Home Access: Level entry;Elevator     Home Layout: One level     Bathroom Shower/Tub: Teacher, early years/pre: Standard     Home Equipment: Cane - single point   Additional Comments: Pt lives with father who is 42 but very high functioning and able to assist as needed.  Performs community tasks including driving. Pt reports she always uses SPC for functional mobility.lives with daughter, on 4L oxygen chronically      Prior Functioning/Environment Level of Independence: Independent with assistive device(s)        Comments: Patient is ambulatory with single point cane in the home and community. Patient has meals on wheels for all meals per her report, but does report she needs help with community errands at this time        OT Problem List: Decreased strength;Decreased activity tolerance;Decreased safety awareness;Decreased knowledge of use of DME or AE;Impaired balance (sitting and/or standing)      OT Treatment/Interventions: Self-care/ADL training;Therapeutic exercise;DME and/or AE instruction;Energy conservation;Therapeutic activities;Patient/family education;Balance training    OT Goals(Current goals can be found in the care plan section) Acute Rehab OT Goals Patient Stated Goal: to go home OT Goal  Formulation: With patient Time For Goal Achievement: 11/17/20 Potential to Achieve Goals: Good ADL Goals Pt Will Perform Lower Body Dressing: with modified independence;sit to/from stand Pt Will Transfer to Toilet: with modified independence;ambulating (BSC over commode, LRAD for amb) Additional ADL Goal #1: Pt will verbalize plan to implement at least 2 learned energy conservation strategies to incorporate into daily ADL routines at home.  OT Frequency: Min 1X/week   Barriers to D/C:            Co-evaluation              AM-PAC OT "6 Clicks" Daily Activity     Outcome Measure Help from another person eating meals?: None Help from another person taking care of personal grooming?: None Help from another person toileting, which includes using toliet, bedpan, or urinal?: A Little Help from another person bathing (including washing, rinsing, drying)?: A Little Help from another person to put on and taking off regular upper body clothing?: None Help from another person to put on and taking off regular lower body clothing?: A Little 6 Click Score: 21   End of Session  Nurse Communication: Mobility status  Activity Tolerance: Patient tolerated treatment well Patient left: in chair;with call bell/phone within reach;with chair alarm set  OT Visit Diagnosis: Other abnormalities of gait and mobility (R26.89);Muscle weakness (generalized) (M62.81)                Time: 1034-1100 OT Time Calculation (min): 26 min Charges:  OT General Charges $OT Visit: 1 Visit OT Evaluation $OT Eval Moderate Complexity: 1 Mod OT Treatments $Self Care/Home Management : 8-22 mins  Jeni Salles, MPH, MS, OTR/L ascom [phone] 11/03/20, 1:09 PM

## 2020-11-03 NOTE — Progress Notes (Signed)
[facility]    SUBJECTIVE: Patient states he feels reasonably well denies any chest pain shortness of breath is reasonable no vertigo lightheadedness or dizziness no nausea vomiting has been seen and evaluated by nephrology patient is being treated for shortness of breath and heart failure and feels reasonably well today   Vitals:   11/03/20 0348 11/03/20 0357 11/03/20 0810 11/03/20 1155  BP:  (!) 179/86 (!) 162/88 (!) 152/77  Pulse:  65 65 65  Resp:  15 16 18   Temp:  98.4 F (36.9 C) 98.3 F (36.8 C) 98.1 F (36.7 C)  TempSrc:  Oral Oral Oral  SpO2:  98% 100% 98%  Weight: 85.7 kg     Height:         Intake/Output Summary (Last 24 hours) at 11/03/2020 1609 Last data filed at 11/03/2020 1500 Gross per 24 hour  Intake 1857.3 ml  Output 2850 ml  Net -992.7 ml      PHYSICAL EXAM  General: Well developed, well nourished, in no acute distress HEENT:  Normocephalic and atramatic legally blind Neck:  No JVD.  Lungs: Clear bilaterally to auscultation and percussion. Heart: HRRR . Normal S1 and S2 without gallops or murmurs.  Abdomen: Bowel sounds are positive, abdomen soft and non-tender  Msk:  Back normal, normal gait. Normal strength and tone for age. Extremities: No clubbing, cyanosis or 2+edema.   Neuro: Alert and oriented X 3. Psych:  Good affect, responds appropriately   LABS: Basic Metabolic Panel: Recent Labs    11/02/20 0935 11/03/20 0541  NA 139 141  K 3.4* 3.3*  CL 107 105  CO2 23 25  GLUCOSE 343* 345*  BUN 52* 55*  CREATININE 3.36* 3.54*  CALCIUM 7.9* 8.3*  MG 1.7 2.2   Liver Function Tests: No results for input(s): AST, ALT, ALKPHOS, BILITOT, PROT, ALBUMIN in the last 72 hours. No results for input(s): LIPASE, AMYLASE in the last 72 hours. CBC: Recent Labs    11/01/20 1520  WBC 11.7*  HGB 9.1*  HCT 28.0*  MCV 89.5  PLT 469*   Cardiac Enzymes: No results for input(s): CKTOTAL, CKMB, CKMBINDEX, TROPONINI in the last 72  hours. BNP: Invalid input(s): POCBNP D-Dimer: No results for input(s): DDIMER in the last 72 hours. Hemoglobin A1C: No results for input(s): HGBA1C in the last 72 hours. Fasting Lipid Panel: No results for input(s): CHOL, HDL, LDLCALC, TRIG, CHOLHDL, LDLDIRECT in the last 72 hours. Thyroid Function Tests: Recent Labs    11/01/20 2323  TSH 3.905   Anemia Panel: Recent Labs    11/01/20 2323  VITAMINB12 176*  FOLATE 8.9  FERRITIN 166  TIBC 176*  IRON 33  RETICCTPCT 2.8    DG Abd 2 Views  Result Date: 11/01/2020 CLINICAL DATA:  Constipation EXAM: ABDOMEN - 2 VIEW COMPARISON:  September 12, 2020 FINDINGS: Air and stool-filled nondilated loops of bowel. Moderate colonic stool burden predominately in the LEFT colon. Surgical clips project over the heart. Status post median sternotomy. Small bilateral pleural effusions. Vascular calcifications. Chondrocalcinosis of the pubic symphysis. IMPRESSION: Nonobstructive bowel gas pattern. Moderate colonic stool burden, predominately in the LEFT colon. Electronically Signed   By: Valentino Saxon MD   On: 11/01/2020 17:58       TELEMETRY: Normal sinus rhythm 65 nonspecific changes:  ASSESSMENT AND PLAN:  Principal Problem:   Chest pain Active Problems:   DM2 (diabetes mellitus, type 2) (HCC)   Leg edema   COPD exacerbation (HCC)   SOB (shortness of breath)  Plan Agree with therapy by nephrology for further management and control Do not recommend any invasive cardiac studies because of renal insufficiency Evidence of heart failure agree with Lasix drip Inhalers as necessary for COPD History of CVA but no new symptoms Continue diabetes management and control Continue statin therapy for hyperlipidemia Recommend conservative cardiac input at this stage   Yolonda Kida, MD 11/03/2020 4:09 PM

## 2020-11-03 NOTE — Plan of Care (Signed)

## 2020-11-04 DIAGNOSIS — G4733 Obstructive sleep apnea (adult) (pediatric): Secondary | ICD-10-CM | POA: Diagnosis not present

## 2020-11-04 DIAGNOSIS — N049 Nephrotic syndrome with unspecified morphologic changes: Secondary | ICD-10-CM

## 2020-11-04 DIAGNOSIS — I2510 Atherosclerotic heart disease of native coronary artery without angina pectoris: Secondary | ICD-10-CM | POA: Diagnosis not present

## 2020-11-04 DIAGNOSIS — I5022 Chronic systolic (congestive) heart failure: Secondary | ICD-10-CM | POA: Diagnosis not present

## 2020-11-04 DIAGNOSIS — N184 Chronic kidney disease, stage 4 (severe): Secondary | ICD-10-CM | POA: Diagnosis not present

## 2020-11-04 DIAGNOSIS — J449 Chronic obstructive pulmonary disease, unspecified: Secondary | ICD-10-CM | POA: Diagnosis not present

## 2020-11-04 DIAGNOSIS — I130 Hypertensive heart and chronic kidney disease with heart failure and stage 1 through stage 4 chronic kidney disease, or unspecified chronic kidney disease: Secondary | ICD-10-CM | POA: Diagnosis not present

## 2020-11-04 DIAGNOSIS — R079 Chest pain, unspecified: Secondary | ICD-10-CM | POA: Diagnosis not present

## 2020-11-04 DIAGNOSIS — E1122 Type 2 diabetes mellitus with diabetic chronic kidney disease: Secondary | ICD-10-CM | POA: Diagnosis not present

## 2020-11-04 LAB — BASIC METABOLIC PANEL
Anion gap: 8 (ref 5–15)
BUN: 58 mg/dL — ABNORMAL HIGH (ref 6–20)
CO2: 27 mmol/L (ref 22–32)
Calcium: 8.1 mg/dL — ABNORMAL LOW (ref 8.9–10.3)
Chloride: 105 mmol/L (ref 98–111)
Creatinine, Ser: 3.79 mg/dL — ABNORMAL HIGH (ref 0.44–1.00)
GFR, Estimated: 13 mL/min — ABNORMAL LOW (ref >60)
Glucose, Bld: 211 mg/dL — ABNORMAL HIGH (ref 70–99)
Potassium: 3.7 mmol/L (ref 3.5–5.1)
Sodium: 140 mmol/L (ref 135–145)

## 2020-11-04 LAB — MAGNESIUM: Magnesium: 2 mg/dL (ref 1.7–2.4)

## 2020-11-04 LAB — GLUCOSE, CAPILLARY
Glucose-Capillary: 220 mg/dL — ABNORMAL HIGH (ref 70–99)
Glucose-Capillary: 257 mg/dL — ABNORMAL HIGH (ref 70–99)
Glucose-Capillary: 123 mg/dL — ABNORMAL HIGH (ref 70–99)
Glucose-Capillary: 122 mg/dL — ABNORMAL HIGH (ref 70–99)

## 2020-11-04 MED ORDER — INSULIN GLARGINE 100 UNIT/ML ~~LOC~~ SOLN
50.0000 [IU] | Freq: Every day | SUBCUTANEOUS | Status: DC
  Administered 2020-11-05 – 2020-11-06 (×2): 50 [IU] via SUBCUTANEOUS
  Filled 2020-11-04 (×2): qty 0.5

## 2020-11-04 MED ORDER — POLYETHYLENE GLYCOL 3350 17 G PO PACK
17.0000 g | PACK | Freq: Every day | ORAL | Status: DC
  Administered 2020-11-04 – 2020-11-09 (×4): 17 g via ORAL
  Filled 2020-11-04 (×5): qty 1

## 2020-11-04 MED ORDER — INSULIN ASPART 100 UNIT/ML ~~LOC~~ SOLN
6.0000 [IU] | Freq: Three times a day (TID) | SUBCUTANEOUS | Status: DC
  Administered 2020-11-04 – 2020-11-06 (×5): 6 [IU] via SUBCUTANEOUS
  Filled 2020-11-04 (×5): qty 1

## 2020-11-04 MED ORDER — INSULIN GLARGINE 100 UNIT/ML ~~LOC~~ SOLN
10.0000 [IU] | Freq: Once | SUBCUTANEOUS | Status: AC
  Administered 2020-11-04: 10 [IU] via SUBCUTANEOUS
  Filled 2020-11-04: qty 0.1

## 2020-11-04 MED ORDER — BISACODYL 5 MG PO TBEC: 5.0000 mg | DELAYED_RELEASE_TABLET | Freq: Every day | ORAL | Status: DC | PRN

## 2020-11-04 NOTE — Progress Notes (Signed)
PT Cancellation Note  Patient Details Name: Terry Arroyo MRN: [id number] DOB: 06/23/1961   Cancelled Treatment:     PT attempt. PT hold. pt with multiple line/meds running and pt requesting to wait till next date to ambulate.  Will return at 10 am on 11/17 per pt request.    Willette Pa 11/04/2020, 3:52 PM

## 2020-11-04 NOTE — Progress Notes (Signed)
OT Cancellation Note  Patient Details Name: Terry Arroyo MRN: [id number] DOB: 1961-11-30   Cancelled Treatment:    Reason Eval/Treat Not Completed: Other (comment). Upon attempt, pt seated on BSC, call bell within reach. She reports she needs additional time for toileting and agreeable to OT re-attempting at later time for OT session.   Jeni Salles, MPH, MS, OTR/L ascom [phone] 11/04/20, 9:59 AM

## 2020-11-04 NOTE — Progress Notes (Addendum)
PROGRESS NOTE    Terry Arroyo  JAS:[id number] DOB: 25-Oct-1961 DOA: 11/01/2020 PCP: Kirk Ruths, MD  Outpatient Specialists: [facility]    Brief Narrative:   From admitter h & p: NAZARET Arroyo is a 59 y.o. female with medical history significant of dm ii, htn, cva, cad seen in ed for sob and le edema. Chart review has march echo which shows moderately decreased LV function with ef of 30%, different from 55-60 in 11/2019. She was recently discharged in October 2021 for similar presentation  and is returning to [place] for not feeling well with generalized complaints of chest pain sob and le edema..  I do not see  cardiology consult note pt, reports she does see cardiologist in addition to nephrologist as well.. Pt lives alone. Pt is retired, pt sees a Production designer, theatre/television/film. Pt states that she does not think she wants snf options but needs someone at home for cleaning and cooking etc.[facility] was started on her case last admission but pt was on wait list.   Assessment & Plan:   Principal Problem:   Chest pain Active Problems:   DM2 (diabetes mellitus, type 2) (HCC)   Leg edema   COPD exacerbation (HCC)   SOB (shortness of breath)   # HFrEF with mild exacerbation # Chest pain # Coronary artery disease. Patient reports few days predominantly dyspnea with exertion worse than baseline. Here no hypoxia documented. Not requiring O2. Does have significant LE pitting edema. CXR w/o overt signs vascular congestion. EF 30-35 earlier this year. Troponins mildly elevated and flat, chest pain has subsided this morning, this in setting of sig ckd, ekg non-ischemic. Hx multi--vessel cabg 2018. Nephrotic syndrome likely playing a significant role here. uop last 24 good - daily weights, strict I/os - hold home torsemide 10, given nephrotic syndrome IV albumin ordered by nephrology with lasix gtt - cont aspirin, atorvastatin, carvedilol, imdur; will leave further evidence-based therapy  additions to cardiology - cardiology following, appreciate recs  # CKD 4/5 # Nephrotic syndrome Per nephrology, renal biopsy from 08/2020 shows background diabetic nephropathy and mild hypersensitivity reaction.  GFR of about 15 seems to be new baseline. Cr down-trending a bit today. UPC markedly elevated (15). UOP 2 L yesterday, neg neg 750. - cont home prednisone 20 qd (nephrology advises continuing as is for now), sodium bicarb - nephrology following  # COPD  Appears more or less quiescent - cont home dulera, albuterol prn  # Debility - PT/OT consult  # Hypokalemia  WNL today - daily kcl 40 bid  # HTN - here bp moderately elevated - cont home amlodipine, coreg, imdur - lasix as above, if bp doesn't improve w/ diuresis consider additional agents  # Anemia of chronic kidney disease Stable  # T2DM - here gluocose elevated to 200s. - hold home 70/30 - increase lantus to 50 qd, increase novolog to 6 w/ meals - dm coordinator following, appreciate recs - SSI resistant  # Constipation - colace  # OSA Not on cpap at home   DVT prophylaxis: heparin Code Status: full Family Communication: none @ bedside  Status is: Inpatient  Remains inpatient appropriate because:Inpatient level of care appropriate due to severity of illness   Dispo: The patient is from: Home              Anticipated d/c is to: home w/ home health (PT has seen, home health orders have been placed)  Anticipated d/c date is: 3-4 days              Patient currently is not medically stable to d/c.        Consultants:  Cardiology, nephrology  Procedures: none  Antimicrobials:  none    Subjective: Feeling well. Thinks swelling somewhat improved. No chest pain, no sob. Tolerating diet.  Objective: Vitals:   11/04/20 0259 11/04/20 0712 11/04/20 0712 11/04/20 1140  BP: (!) 154/86 (!) 157/81  (!) 164/92  Pulse: 64 67  100  Resp: 13 (!) 22  (!) 23  Temp: 98.5 F (36.9 C) 98.4  F (36.9 C) 98.4 F (36.9 C) 97.9 F (36.6 C)  TempSrc: Oral Oral Oral Oral  SpO2: 98% 99%  99%  Weight: 88.3 kg  85.1 kg   Height:        Intake/Output Summary (Last 24 hours) at 11/04/2020 1329 Last data filed at 11/04/2020 1021 Gross per 24 hour  Intake 1393.89 ml  Output 1800 ml  Net -406.11 ml   Filed Weights   11/03/20 0348 11/04/20 0259 11/04/20 0712  Weight: 85.7 kg 88.3 kg 85.1 kg    Examination:  General exam: Appears calm and comfortable  Respiratory system: ctab Cardiovascular system: S1 & S2 heard, RRR. Soft systolic murmur Gastrointestinal system: Abdomen is nondistended, soft and nontender. No organomegaly or masses felt. Normal bowel sounds heard. Central nervous system: Alert and oriented. No focal neurological deficits. Extremities: Symmetric 5 x 5 power. Skin: No rashes. Moderate pitting edema improving Psychiatry: speech a bit slowed, affect flat    Data Reviewed: I have personally reviewed following labs and imaging studies  CBC: Recent Labs  Lab 11/01/20 1520  WBC 11.7*  HGB 9.1*  HCT 28.0*  MCV 89.5  PLT 010*   Basic Metabolic Panel: Recent Labs  Lab 11/01/20 1520 11/01/20 2323 11/02/20 0935 11/03/20 0541 11/04/20 0543  NA 134*  --  139 141 140  K 3.0*  --  3.4* 3.3* 3.7  CL 101  --  107 105 105  CO2 22  --  23 25 27   GLUCOSE 520*  --  343* 345* 211*  BUN 60*  --  52* 55* 58*  CREATININE 3.30* 3.14* 3.36* 3.54* 3.79*  CALCIUM 8.0*  --  7.9* 8.3* 8.1*  MG  --   --  1.7 2.2 2.0   GFR: Estimated Creatinine Clearance: 17.8 mL/min (A) (by C-G formula based on SCr of 3.79 mg/dL (H)). Liver Function Tests: No results for input(s): AST, ALT, ALKPHOS, BILITOT, PROT, ALBUMIN in the last 168 hours. No results for input(s): LIPASE, AMYLASE in the last 168 hours. No results for input(s): AMMONIA in the last 168 hours. Coagulation Profile: No results for input(s): INR, PROTIME in the last 168 hours. Cardiac Enzymes: No results for  input(s): CKTOTAL, CKMB, CKMBINDEX, TROPONINI in the last 168 hours. BNP (last 3 results) No results for input(s): PROBNP in the last 8760 hours. HbA1C: No results for input(s): HGBA1C in the last 72 hours. CBG: Recent Labs  Lab 11/03/20 1154 11/03/20 1659 11/03/20 2050 11/04/20 0805 11/04/20 1140  GLUCAP 232* 314* 251* 220* 257*   Lipid Profile: No results for input(s): CHOL, HDL, LDLCALC, TRIG, CHOLHDL, LDLDIRECT in the last 72 hours. Thyroid Function Tests: Recent Labs    11/01/20 2323  TSH 3.905  FREET4 0.88   Anemia Panel: Recent Labs    11/01/20 2323  VITAMINB12 176*  FOLATE 8.9  FERRITIN 166  TIBC 176*  IRON 33  RETICCTPCT 2.8   Urine analysis:    Component Value Date/Time   COLORURINE STRAW (A) 10/21/2020 0829   APPEARANCEUR CLEAR (A) 10/21/2020 0829   LABSPEC 1.007 10/21/2020 0829   PHURINE 7.0 10/21/2020 0829   GLUCOSEU 150 (A) 10/21/2020 0829   HGBUR NEGATIVE 10/21/2020 0829   BILIRUBINUR NEGATIVE 10/21/2020 0829   KETONESUR NEGATIVE 10/21/2020 0829   PROTEINUR >=300 (A) 10/21/2020 0829   NITRITE NEGATIVE 10/21/2020 0829   LEUKOCYTESUR SMALL (A) 10/21/2020 0829   Sepsis Labs: @LABRCNTIP (procalcitonin:4,lacticidven:4)  ) Recent Results (from the past 240 hour(s))  Respiratory Panel by RT PCR (Flu A&B, Covid) - Nasopharyngeal Swab     Status: None   Collection Time: 11/01/20  9:18 PM   Specimen: Nasopharyngeal Swab  Result Value Ref Range Status   SARS Coronavirus 2 by RT PCR NEGATIVE NEGATIVE Final    Comment: (NOTE) SARS-CoV-2 target nucleic acids are NOT DETECTED.  The SARS-CoV-2 RNA is generally detectable in upper respiratoy specimens during the acute phase of infection. The lowest concentration of SARS-CoV-2 viral copies this assay can detect is 131 copies/mL. A negative result does not preclude SARS-Cov-2 infection and should not be used as the sole basis for treatment or other patient management decisions. A negative result may  occur with  improper specimen collection/handling, submission of specimen other than nasopharyngeal swab, presence of viral mutation(s) within the areas targeted by this assay, and inadequate number of viral copies (<131 copies/mL). A negative result must be combined with clinical observations, patient history, and epidemiological information. The expected result is Negative.  Fact Sheet for Patients:  PinkCheek.be  Fact Sheet for Healthcare Providers:  GravelBags.it  This test is no t yet approved or cleared by the [place] FDA and  has been authorized for detection and/or diagnosis of SARS-CoV-2 by FDA under an Emergency Use Authorization (EUA). This EUA will remain  in effect (meaning this test can be used) for the duration of the COVID-19 declaration under Section 564(b)(1) of the Act, 21 U.S.C. section 360bbb-3(b)(1), unless the authorization is terminated or revoked sooner.     Influenza A by PCR NEGATIVE NEGATIVE Final   Influenza B by PCR NEGATIVE NEGATIVE Final    Comment: (NOTE) The Xpert Xpress SARS-CoV-2/FLU/RSV assay is intended as an aid in  the diagnosis of influenza from Nasopharyngeal swab specimens and  should not be used as a sole basis for treatment. Nasal washings and  aspirates are unacceptable for Xpert Xpress SARS-CoV-2/FLU/RSV  testing.  Fact Sheet for Patients: PinkCheek.be  Fact Sheet for Healthcare Providers: GravelBags.it  This test is not yet approved or cleared by the [place] FDA and  has been authorized for detection and/or diagnosis of SARS-CoV-2 by  FDA under an Emergency Use Authorization (EUA). This EUA will remain  in effect (meaning this test can be used) for the duration of the  Covid-19 declaration under Section 564(b)(1) of the Act, 21  U.S.C. section 360bbb-3(b)(1), unless the authorization is  terminated or  revoked. Performed at [facility], [address] [place], [place] [address]          Radiology Studies: No results found.      Scheduled Meds: . amLODipine  10 mg Oral Daily  . aspirin EC  81 mg Oral Daily  . atorvastatin  80 mg Oral q1800  . carvedilol  25 mg Oral BID WC  . famotidine  10 mg Oral Daily  . fenofibrate  160 mg Oral q1800  . heparin  5,000  Units Subcutaneous Q8H  . insulin aspart  0-20 Units Subcutaneous TID WC  . insulin aspart  0-5 Units Subcutaneous QHS  . insulin aspart  4 Units Subcutaneous TID WC  . insulin glargine  40 Units Subcutaneous Daily  . isosorbide mononitrate  30 mg Oral Daily  . mirtazapine  30 mg Oral QHS  . mometasone-formoterol  2 puff Inhalation BID  . polyethylene glycol  17 g Oral Daily  . potassium chloride  40 mEq Oral BID  . prednisoLONE acetate  1 drop Right Eye QID  . predniSONE  20 mg Oral Q breakfast  . sodium bicarbonate  1,300 mg Oral TID  . sodium chloride flush  3 mL Intravenous Q12H  . topiramate  25 mg Oral Daily  . cyanocobalamin  1,000 mcg Oral Daily   Continuous Infusions: . sodium chloride 7 mL/hr at 11/03/20 1800  . albumin human 25 g (11/04/20 0616)  . furosemide (LASIX) 200 mg in dextrose 5% 100 mL (2mg /mL) infusion 6 mg/hr (11/03/20 1500)     LOS: 3 days    Time spent: 30 min    Desma Maxim, MD [facility]   If 7PM-7AM, please contact night-coverage [email or web address] Password [email or web address] 11/04/2020, 1:29 PM

## 2020-11-04 NOTE — Progress Notes (Signed)
Mobility Specialist - Progress Note   11/04/20 1528  Mobility  Range of Motion/Exercises All extremities (ankle pumps, quad sets, slr, heel slides, crossover reach)  Level of Assistance Independent  Assistive Device None  Distance Ambulated (ft) 0 ft  Mobility Response Tolerated well  Mobility performed by Mobility specialist  $Mobility charge 1 Mobility    Pre-mobility: 65 HR, 98% SpO2 Post-mobility: 67 HR, 98% SpO2   Pt was lying in bed upon arrival utilizing room air. Pt agreed to session. Pt denied pain, nausea, or fatigue but did c/o being cold. Pt performed supine exercises: ankle pumps 2x10, straight leg raises x 15, heel slides x12, hip isometrics 2x15, quad sets x 10, and cross-over reach 2x10 independently. Overall, pt tolerated session well. Pt was left in bed with all needs in reach and alarm set.    Kathee Delton Mobility Specialist 11/04/20, 3:32 PM

## 2020-11-04 NOTE — Care Management Important Message (Signed)
Important Message  Patient Details  Name: Terry Arroyo MRN: [id number] Date of Birth: [birth year]   Medicare Important Message Given:  Yes     Dannette Barbara 11/04/2020, 11:00 AM

## 2020-11-04 NOTE — Progress Notes (Signed)
[facility]  ROUNDING NOTE   Subjective:  Patient sitting on a recliner, denies worsening SOB, nausea or vomiting, reports constipation.   Objective:  Vital signs in last 24 hours:  Temp:  [97.8 F (36.6 C)-98.5 F (36.9 C)] 97.9 F (36.6 C) (11/16 1140) Pulse Rate:  [64-100] 100 (11/16 1140) Resp:  [13-23] 23 (11/16 1140) BP: (140-164)/(78-92) 164/92 (11/16 1140) SpO2:  [96 %-99 %] 99 % (11/16 1140) Weight:  [85.1 kg-88.3 kg] 85.1 kg (11/16 0712)  Weight change: 2.57 kg Filed Weights   11/03/20 0348 11/04/20 0259 11/04/20 0712  Weight: 85.7 kg 88.3 kg 85.1 kg    Intake/Output: I/O last 3 completed shifts: In: 2270.9 [P.O.:1918; I.V.:184.9; IV Piggyback:168] Out: 3650 [Urine:3650]   Intake/Output this shift:  Total I/O In: 720 [P.O.:720] Out: 700 [Urine:700]  Physical Exam: General:  Sitting up on a recliner, appears in no acute distress  Head:   Moist oral mucosal membranes  Eyes:  Sclerae and conjunctivae clear  Lungs:   Respiration even unlabored, lungs clear, diminished at the bases  Heart:  Regular rate and rhythm  Abdomen:   Soft, nontender, nondistended  Extremities:  2+ peripheral edema.  Neurologic:  Oriented x3  Skin:  No acute lesions or rashes    Basic Metabolic Panel: Recent Labs  Lab 11/01/20 1520 11/01/20 1520 11/01/20 2323 11/02/20 0935 11/03/20 0541 11/04/20 0543  NA 134*  --   --  139 141 140  K 3.0*  --   --  3.4* 3.3* 3.7  CL 101  --   --  107 105 105  CO2 22  --   --  23 25 27   GLUCOSE 520*  --   --  343* 345* 211*  BUN 60*  --   --  52* 55* 58*  CREATININE 3.30*  --  3.14* 3.36* 3.54* 3.79*  CALCIUM 8.0*   < >  --  7.9* 8.3* 8.1*  MG  --   --   --  1.7 2.2 2.0   < > = values in this interval not displayed.    Liver Function Tests: No results for input(s): AST, ALT, ALKPHOS, BILITOT, PROT, ALBUMIN in the last 168 hours. No results for input(s): LIPASE, AMYLASE in the last 168 hours. No results for input(s):  AMMONIA in the last 168 hours.  CBC: Recent Labs  Lab 11/01/20 1520  WBC 11.7*  HGB 9.1*  HCT 28.0*  MCV 89.5  PLT 469*    Cardiac Enzymes: No results for input(s): CKTOTAL, CKMB, CKMBINDEX, TROPONINI in the last 168 hours.  BNP: Invalid input(s): POCBNP  CBG: Recent Labs  Lab 11/03/20 1154 11/03/20 1659 11/03/20 2050 11/04/20 0805 11/04/20 1140  GLUCAP 232* 314* 251* 220* 257*    Microbiology: Results for orders placed or performed during the hospital encounter of 11/01/20  Respiratory Panel by RT PCR (Flu A&B, Covid) - Nasopharyngeal Swab     Status: None   Collection Time: 11/01/20  9:18 PM   Specimen: Nasopharyngeal Swab  Result Value Ref Range Status   SARS Coronavirus 2 by RT PCR NEGATIVE NEGATIVE Final    Comment: (NOTE) SARS-CoV-2 target nucleic acids are NOT DETECTED.  The SARS-CoV-2 RNA is generally detectable in upper respiratoy specimens during the acute phase of infection. The lowest concentration of SARS-CoV-2 viral copies this assay can detect is 131 copies/mL. A negative result does not preclude SARS-Cov-2 infection and should not be used as the sole basis for treatment or other patient management  decisions. A negative result may occur with  improper specimen collection/handling, submission of specimen other than nasopharyngeal swab, presence of viral mutation(s) within the areas targeted by this assay, and inadequate number of viral copies (<131 copies/mL). A negative result must be combined with clinical observations, patient history, and epidemiological information. The expected result is Negative.  Fact Sheet for Patients:  PinkCheek.be  Fact Sheet for Healthcare Providers:  GravelBags.it  This test is no t yet approved or cleared by the [place] FDA and  has been authorized for detection and/or diagnosis of SARS-CoV-2 by FDA under an Emergency Use Authorization (EUA). This  EUA will remain  in effect (meaning this test can be used) for the duration of the COVID-19 declaration under Section 564(b)(1) of the Act, 21 U.S.C. section 360bbb-3(b)(1), unless the authorization is terminated or revoked sooner.     Influenza A by PCR NEGATIVE NEGATIVE Final   Influenza B by PCR NEGATIVE NEGATIVE Final    Comment: (NOTE) The Xpert Xpress SARS-CoV-2/FLU/RSV assay is intended as an aid in  the diagnosis of influenza from Nasopharyngeal swab specimens and  should not be used as a sole basis for treatment. Nasal washings and  aspirates are unacceptable for Xpert Xpress SARS-CoV-2/FLU/RSV  testing.  Fact Sheet for Patients: PinkCheek.be  Fact Sheet for Healthcare Providers: GravelBags.it  This test is not yet approved or cleared by the [place] FDA and  has been authorized for detection and/or diagnosis of SARS-CoV-2 by  FDA under an Emergency Use Authorization (EUA). This EUA will remain  in effect (meaning this test can be used) for the duration of the  Covid-19 declaration under Section 564(b)(1) of the Act, 21  U.S.C. section 360bbb-3(b)(1), unless the authorization is  terminated or revoked. Performed at [facility], [address] [place], [place] [address]     Coagulation Studies: No results for input(s): LABPROT, INR in the last 72 hours.  Urinalysis: No results for input(s): COLORURINE, LABSPEC, PHURINE, GLUCOSEU, HGBUR, BILIRUBINUR, KETONESUR, PROTEINUR, UROBILINOGEN, NITRITE, LEUKOCYTESUR in the last 72 hours.  Invalid input(s): APPERANCEUR    Imaging: No results found.   Medications:   . sodium chloride 7 mL/hr at 11/03/20 1800  . albumin human 25 g (11/04/20 0616)  . furosemide (LASIX) 200 mg in dextrose 5% 100 mL (2mg /mL) infusion 6 mg/hr (11/03/20 1500)   . amLODipine  10 mg Oral Daily  . aspirin EC  81 mg Oral Daily  . atorvastatin  80 mg Oral q1800  .  carvedilol  25 mg Oral BID WC  . famotidine  10 mg Oral Daily  . fenofibrate  160 mg Oral q1800  . heparin  5,000 Units Subcutaneous Q8H  . insulin aspart  0-20 Units Subcutaneous TID WC  . insulin aspart  0-5 Units Subcutaneous QHS  . insulin aspart  4 Units Subcutaneous TID WC  . insulin glargine  40 Units Subcutaneous Daily  . isosorbide mononitrate  30 mg Oral Daily  . mirtazapine  30 mg Oral QHS  . mometasone-formoterol  2 puff Inhalation BID  . polyethylene glycol  17 g Oral Daily  . potassium chloride  40 mEq Oral BID  . prednisoLONE acetate  1 drop Right Eye QID  . predniSONE  20 mg Oral Q breakfast  . sodium bicarbonate  1,300 mg Oral TID  . sodium chloride flush  3 mL Intravenous Q12H  . topiramate  25 mg Oral Daily  . cyanocobalamin  1,000 mcg Oral Daily   sodium chloride,  albuterol, cyclobenzaprine, sodium chloride flush  Assessment/ Plan:  59 y.o. female with past medical history of congestive heart failure, diabetes mellitus type 2 with chronic kidney disease, hypertension, hyperlipidemia, chronic kidney disease stage IV with diabetic nephropathy and interstitial nephritis, history of brain tumor, left eye blindness who presented with increasing shortness of breath and lower extremity edema.   # Chronic kidney disease stage IV #proteinuria #mild interstitial nephritis.  #Bilateral lower extremity edema  Lab Results  Component Value Date   CREATININE 3.79 (H) 11/04/2020   CREATININE 3.54 (H) 11/03/2020   CREATININE 3.36 (H) 11/02/2020  Urine output for the preceding 24 hours is 1950 mL Patient continues to be on furosemide infusion 6 mg/h Still has significant peripheral edema We will continue monitoring renal function closely No acute indication for dialysis  #Anemia of chronic kidney disease.   Hemoglobin 9.1 on 11/01/2020 We will continue monitoring CBCs  # Metabolic acidosis.   Bicarbonate 27 today Patient is on sodium bicarbonate p.o.  # Diabetes  mellitus type 2 with chronic kidney disease Blood glucose levels stay above goal Patient is on insulin aspart and insulin glargine  LOS: 3 Khristen Cheyney 11/16/202112:13 PM

## 2020-11-05 ENCOUNTER — Ambulatory Visit: Payer: Medicare HMO | Admitting: Family

## 2020-11-05 DIAGNOSIS — I5023 Acute on chronic systolic (congestive) heart failure: Secondary | ICD-10-CM

## 2020-11-05 DIAGNOSIS — N183 Chronic kidney disease, stage 3 unspecified: Secondary | ICD-10-CM | POA: Diagnosis not present

## 2020-11-05 DIAGNOSIS — N049 Nephrotic syndrome with unspecified morphologic changes: Secondary | ICD-10-CM

## 2020-11-05 DIAGNOSIS — E1122 Type 2 diabetes mellitus with diabetic chronic kidney disease: Secondary | ICD-10-CM | POA: Diagnosis not present

## 2020-11-05 LAB — BASIC METABOLIC PANEL
Anion gap: 11 (ref 5–15)
BUN: 70 mg/dL — ABNORMAL HIGH (ref 6–20)
CO2: 26 mmol/L (ref 22–32)
Calcium: 8.8 mg/dL — ABNORMAL LOW (ref 8.9–10.3)
Chloride: 106 mmol/L (ref 98–111)
Creatinine, Ser: 3.9 mg/dL — ABNORMAL HIGH (ref 0.44–1.00)
GFR, Estimated: 13 mL/min — ABNORMAL LOW (ref >60)
Glucose, Bld: 170 mg/dL — ABNORMAL HIGH (ref 70–99)
Potassium: 4 mmol/L (ref 3.5–5.1)
Sodium: 143 mmol/L (ref 135–145)

## 2020-11-05 LAB — GLUCOSE, CAPILLARY
Glucose-Capillary: 159 mg/dL — ABNORMAL HIGH (ref 70–99)
Glucose-Capillary: 132 mg/dL — ABNORMAL HIGH (ref 70–99)
Glucose-Capillary: 130 mg/dL — ABNORMAL HIGH (ref 70–99)
Glucose-Capillary: 141 mg/dL — ABNORMAL HIGH (ref 70–99)

## 2020-11-05 LAB — MAGNESIUM: Magnesium: 1.9 mg/dL (ref 1.7–2.4)

## 2020-11-05 MED ORDER — IPRATROPIUM-ALBUTEROL 0.5-2.5 (3) MG/3ML IN SOLN
3.0000 mL | RESPIRATORY_TRACT | Status: DC | PRN
  Administered 2020-11-05 – 2020-11-09 (×2): 3 mL via RESPIRATORY_TRACT
  Filled 2020-11-05 (×2): qty 3

## 2020-11-05 MED ORDER — SENNOSIDES-DOCUSATE SODIUM 8.6-50 MG PO TABS
2.0000 | ORAL_TABLET | Freq: Two times a day (BID) | ORAL | Status: DC
  Administered 2020-11-05 – 2020-11-12 (×9): 2 via ORAL
  Filled 2020-11-05 (×13): qty 2

## 2020-11-05 MED ORDER — LACTULOSE 10 GM/15ML PO SOLN
20.0000 g | Freq: Once | ORAL | Status: AC
  Administered 2020-11-05: 20 g via ORAL
  Filled 2020-11-05: qty 30

## 2020-11-05 NOTE — Progress Notes (Signed)
Occupational Therapy Treatment Patient Details Name: Terry Arroyo MRN: [id number] DOB: 08/30/1961 Today's Date: 11/05/2020    History of present illness Terry Arroyo is a 59 y.o. female with medical history significant of dm ii, htn, cva, cad seen in ed for sob and le edema. Chart review has march echo which shows moderately decreased LV function with ef of 30%. She was recently discharged in October 2021 for similar presentation  and is returning to [place] for not feeling well with generalized complaints of chest pain sob and le edema..  I do not see  cardiology consult note pt, reports she does see cardiologist in addition to nephrologist as well. Patient lives alone in an apartment for seniors community. [facility] was started on her case last admission but pt was on wait list.   OT comments  Upon entering the room, pt seated in recliner chair and agreeable to OT intervention. Pt verbalizing urgency for toileting needs and stands from recliner chair with RW and transfers onto 3 in1 commode with supervision for safety. Pt performed clothing management and hygiene with supervision as well and ambulates to sink with supervision and therapist assisting with line management for safety. Pt standing for ~ 5 minutes to wash face, wash hands, and brush teeth with supervision overall. Pt takes seated rest break secondary to fatigue with therapist providing energy conservation education for home use. Pt becomes very tearful over current situation and verbalized, " It has been one thing after the other for the last two years" with therapist providing therapeutic use of self. Pt requesting to continue and ambulates into hallway and transitions easily to physical therapy session. Pt continues to benefit from OT intervention and will benefit from [facility] once discharged.  Follow Up Recommendations  Home health OT;Supervision - Intermittent    Equipment Recommendations  Tub/shower seat       Precautions /  Restrictions Precautions Precautions: Fall Restrictions Weight Bearing Restrictions: No       Mobility Bed Mobility    General bed mobility comments: pt seated in recliner chair upon entering the room  Transfers Overall transfer level: Needs assistance Equipment used: Rolling walker (2 wheeled) Transfers: Sit to/from Stand Sit to Stand: Supervision         General transfer comment: supervision overall with min cuing for safety awareness secondary to lines/leads management    Balance Overall balance assessment: Needs assistance Sitting-balance support: No upper extremity supported;Feet supported Sitting balance-Leahy Scale: Good Sitting balance - Comments: no LOB   Standing balance support: During functional activity;No upper extremity supported Standing balance-Leahy Scale: Fair Standing balance comment: Pt does have slight unsteadinesss however overall no LOB           ADL either performed or assessed with clinical judgement   ADL Overall ADL's : Needs assistance/impaired     Grooming: Wash/dry hands;Wash/dry face;Oral care;Standing;Supervision/safety      Toilet Transfer: Research officer, trade union Details (indicate cue type and reason): min cuing and assistance for management of lines/leads Toileting- Clothing Manipulation and Hygiene: Supervision/safety;Sit to/from stand        Vision Patient Visual Report: No change from baseline            Cognition Arousal/Alertness: Awake/alert Behavior During Therapy: WFL for tasks assessed/performed Overall Cognitive Status: Within Functional Limits for tasks assessed    General Comments: pt is A and o X 4                   Pertinent Vitals/  Pain       Pain Assessment: No/denies pain         Frequency  Min 1X/week        Progress Toward Goals  OT Goals(current goals can now be found in the care plan section)  Progress towards OT goals: Progressing toward goals  Acute Rehab  OT Goals Patient Stated Goal: to go home OT Goal Formulation: With patient Time For Goal Achievement: 11/17/20 Potential to Achieve Goals: Good  Plan Discharge plan remains appropriate       AM-PAC OT "6 Clicks" Daily Activity     Outcome Measure   Help from another person eating meals?: None Help from another person taking care of personal grooming?: None Help from another person toileting, which includes using toliet, bedpan, or urinal?: A Little Help from another person bathing (including washing, rinsing, drying)?: A Little   Help from another person to put on and taking off regular lower body clothing?: A Little 6 Click Score: 17    End of Session Equipment Utilized During Treatment: Rolling walker;Other (comment) (BSC)  OT Visit Diagnosis: Other abnormalities of gait and mobility (R26.89);Muscle weakness (generalized) (M62.81)   Activity Tolerance Patient tolerated treatment well   Patient Left Other (comment) (hand off to PTA)   Nurse Communication          Time: 0240-9735 OT Time Calculation (min): 24 min  Charges: OT General Charges $OT Visit: 1 Visit OT Treatments $Self Care/Home Management : 23-37 mins  Darleen Crocker, MS, OTR/L , CBIS ascom [phone]  11/05/20, 12:30 PM

## 2020-11-05 NOTE — Progress Notes (Signed)
Physical Therapy Treatment Patient Details Name: Terry Arroyo MRN: [id number] DOB: 1961/05/22 Today's Date: 11/05/2020    History of Present Illness Terry Arroyo is a 59 y.o. female with medical history significant of dm ii, htn, cva, cad seen in ed for sob and le edema. Chart review has march echo which shows moderately decreased LV function with ef of 30%. She was recently discharged in October 2021 for similar presentation  and is returning to [place] for not feeling well with generalized complaints of chest pain sob and le edema..  I do not see  cardiology consult note pt, reports she does see cardiologist in addition to nephrologist as well. Patient lives alone in an apartment for seniors community. [facility] was started on her case last admission but pt was on wait list.    PT Comments    Pt was standing in hallway finishing OT session upon PT session starting. Pt upset about situation overall. Throughout session pt less upset. She was easily able to ambulate 400 + ft with RW + 1 standing rest break. No LOB. More limited by fatigue than anything else. Recommend increased OOB activity/ambulation throughout the day. Mobility team is following. She will benefit form continued skilled HHPT at [facility] to address deficits and continue to assist pt back to PLOF.     Follow Up Recommendations  Home health PT     Equipment Recommendations  Rolling walker with 5" wheels    Recommendations for Other Services       Precautions / Restrictions Precautions Precautions: Fall Restrictions Weight Bearing Restrictions: No    Mobility  Bed Mobility               General bed mobility comments: Pt was already up with Ot upon PT session starting  Transfers Overall transfer level: Modified independent Equipment used: Rolling walker (2 wheeled) Transfers: Sit to/from Stand Sit to Stand: Supervision            Ambulation/Gait Ambulation/Gait assistance: Supervision Gait Distance  (Feet): 400 Feet (1 seated rest halfway) Assistive device: Rolling walker (2 wheeled) Gait Pattern/deviations: Wide base of support Gait velocity: decreased   General Gait Details: Pt was easily able to ambulate 400 ft with 1 seated rest halfway         Balance Overall balance assessment: Needs assistance Sitting-balance support: No upper extremity supported;Feet supported Sitting balance-Leahy Scale: Normal     Standing balance support: During functional activity;No upper extremity supported Standing balance-Leahy Scale: Fair Standing balance comment: Pt does have slight unsteadinesss however overall no LOB        Cognition Arousal/Alertness: Awake/alert Behavior During Therapy: WFL for tasks assessed/performed Overall Cognitive Status: Within Functional Limits for tasks assessed        General Comments: pt is A and o X 4             Pertinent Vitals/Pain Pain Assessment: No/denies pain           PT Goals (current goals can now be found in the care plan section) Acute Rehab PT Goals Patient Stated Goal: to go home Progress towards PT goals: Progressing toward goals    Frequency    Min 2X/week      PT Plan Current plan remains appropriate       AM-PAC PT "6 Clicks" Mobility   Outcome Measure  Help needed turning from your back to your side while in a flat bed without using bedrails?: None Help needed moving from lying on  your back to sitting on the side of a flat bed without using bedrails?: None Help needed moving to and from a bed to a chair (including a wheelchair)?: A Little Help needed standing up from a chair using your arms (e.g., wheelchair or bedside chair)?: A Little Help needed to walk in hospital room?: A Little Help needed climbing 3-5 steps with a railing? : A Little 6 Click Score: 20    End of Session Equipment Utilized During Treatment: Gait belt Activity Tolerance: Patient limited by fatigue Patient left: in chair;with call  bell/phone within reach;with chair alarm set;with nursing/sitter in room Nurse Communication: Mobility status PT Visit Diagnosis: Muscle weakness (generalized) (M62.81);Unsteadiness on feet (R26.81)     Time: 5374-8270 PT Time Calculation (min) (ACUTE ONLY): 15 min  Charges:  $Gait Training: 8-22 mins                     Julaine Fusi PTA 11/05/20, 11:47 AM

## 2020-11-05 NOTE — Progress Notes (Signed)
[facility]  ROUNDING NOTE   Subjective:  Patient getting attended by occupational therapist, ambulating using a rolling walker. She is tearful and appears to be overwhelmed with her multiple medical conditions.  She is on Furosemide infusion, still with significant peripheral edema. Total urine output for the preceding 24 hours is 2,650 ml.  Objective:  Vital signs in last 24 hours:  Temp:  [97.7 F (36.5 C)-98.3 F (36.8 C)] 98.2 F (36.8 C) (11/17 1110) Pulse Rate:  [65-67] 65 (11/17 1110) Resp:  [18-20] 18 (11/17 1110) BP: (103-176)/(82-90) 103/89 (11/17 1110) SpO2:  [97 %-100 %] 99 % (11/17 1110) Weight:  [84.5 kg] 84.5 kg (11/17 0400)  Weight change: -3.16 kg Filed Weights   11/04/20 0259 11/04/20 0712 11/05/20 0400  Weight: 88.3 kg 85.1 kg 84.5 kg    Intake/Output: I/O last 3 completed shifts: In: 2225.5 [P.O.:1840; I.V.:78; IV Piggyback:307.5] Out: 3750 [Urine:3750]   Intake/Output this shift:  Total I/O In: 240 [P.O.:240] Out: 100 [Urine:100]  Physical Exam: General:  Getting attended by the occupational therapist, tearful when talking about medical problems  Head:  Normocephalic,atraumatic  Eyes: Anicteric  Lungs:   Lungs clear, diminished at the bases,respirations symmetrical, slightly labored with exertion  Heart:  S1S2, no rubs or gallops  Abdomen:   Soft, nontender, nondistended  Extremities:  2+ peripheral edema.  Neurologic:  Awake,alert,oriented  Skin:  No acute lesions or rashes    Basic Metabolic Panel: Recent Labs  Lab 11/01/20 1520 11/01/20 1520 11/01/20 2323 11/02/20 0935 11/02/20 0935 11/03/20 0541 11/04/20 0543 11/05/20 0548  NA 134*  --   --  139  --  141 140 143  K 3.0*  --   --  3.4*  --  3.3* 3.7 4.0  CL 101  --   --  107  --  105 105 106  CO2 22  --   --  23  --  25 27 26   GLUCOSE 520*  --   --  343*  --  345* 211* 170*  BUN 60*  --   --  52*  --  55* 58* 70*  CREATININE 3.30*   < > 3.14* 3.36*  --  3.54*  3.79* 3.90*  CALCIUM 8.0*   < >  --  7.9*   < > 8.3* 8.1* 8.8*  MG  --   --   --  1.7  --  2.2 2.0 1.9   < > = values in this interval not displayed.    Liver Function Tests: No results for input(s): AST, ALT, ALKPHOS, BILITOT, PROT, ALBUMIN in the last 168 hours. No results for input(s): LIPASE, AMYLASE in the last 168 hours. No results for input(s): AMMONIA in the last 168 hours.  CBC: Recent Labs  Lab 11/01/20 1520  WBC 11.7*  HGB 9.1*  HCT 28.0*  MCV 89.5  PLT 469*    Cardiac Enzymes: No results for input(s): CKTOTAL, CKMB, CKMBINDEX, TROPONINI in the last 168 hours.  BNP: Invalid input(s): POCBNP  CBG: Recent Labs  Lab 11/04/20 1140 11/04/20 1614 11/04/20 1936 11/05/20 0808 11/05/20 1129  GLUCAP 257* 123* 122* 159* 132*    Microbiology: Results for orders placed or performed during the hospital encounter of 11/01/20  Respiratory Panel by RT PCR (Flu A&B, Covid) - Nasopharyngeal Swab     Status: None   Collection Time: 11/01/20  9:18 PM   Specimen: Nasopharyngeal Swab  Result Value Ref Range Status   SARS Coronavirus 2 by RT PCR  NEGATIVE NEGATIVE Final    Comment: (NOTE) SARS-CoV-2 target nucleic acids are NOT DETECTED.  The SARS-CoV-2 RNA is generally detectable in upper respiratoy specimens during the acute phase of infection. The lowest concentration of SARS-CoV-2 viral copies this assay can detect is 131 copies/mL. A negative result does not preclude SARS-Cov-2 infection and should not be used as the sole basis for treatment or other patient management decisions. A negative result may occur with  improper specimen collection/handling, submission of specimen other than nasopharyngeal swab, presence of viral mutation(s) within the areas targeted by this assay, and inadequate number of viral copies (<131 copies/mL). A negative result must be combined with clinical observations, patient history, and epidemiological information. The expected result is  Negative.  Fact Sheet for Patients:  PinkCheek.be  Fact Sheet for Healthcare Providers:  GravelBags.it  This test is no t yet approved or cleared by the [place] FDA and  has been authorized for detection and/or diagnosis of SARS-CoV-2 by FDA under an Emergency Use Authorization (EUA). This EUA will remain  in effect (meaning this test can be used) for the duration of the COVID-19 declaration under Section 564(b)(1) of the Act, 21 U.S.C. section 360bbb-3(b)(1), unless the authorization is terminated or revoked sooner.     Influenza A by PCR NEGATIVE NEGATIVE Final   Influenza B by PCR NEGATIVE NEGATIVE Final    Comment: (NOTE) The Xpert Xpress SARS-CoV-2/FLU/RSV assay is intended as an aid in  the diagnosis of influenza from Nasopharyngeal swab specimens and  should not be used as a sole basis for treatment. Nasal washings and  aspirates are unacceptable for Xpert Xpress SARS-CoV-2/FLU/RSV  testing.  Fact Sheet for Patients: PinkCheek.be  Fact Sheet for Healthcare Providers: GravelBags.it  This test is not yet approved or cleared by the [place] FDA and  has been authorized for detection and/or diagnosis of SARS-CoV-2 by  FDA under an Emergency Use Authorization (EUA). This EUA will remain  in effect (meaning this test can be used) for the duration of the  Covid-19 declaration under Section 564(b)(1) of the Act, 21  U.S.C. section 360bbb-3(b)(1), unless the authorization is  terminated or revoked. Performed at [facility], [address] [place], [place] [address]     Coagulation Studies: No results for input(s): LABPROT, INR in the last 72 hours.  Urinalysis: No results for input(s): COLORURINE, LABSPEC, PHURINE, GLUCOSEU, HGBUR, BILIRUBINUR, KETONESUR, PROTEINUR, UROBILINOGEN, NITRITE, LEUKOCYTESUR in the last 72 hours.  Invalid  input(s): APPERANCEUR    Imaging: No results found.   Medications:   . sodium chloride 7 mL/hr at 11/03/20 1800  . albumin human 25 g (11/05/20 0525)  . furosemide (LASIX) 200 mg in dextrose 5% 100 mL (2mg /mL) infusion 6 mg/hr (11/04/20 1415)   . amLODipine  10 mg Oral Daily  . aspirin EC  81 mg Oral Daily  . atorvastatin  80 mg Oral q1800  . carvedilol  25 mg Oral BID WC  . famotidine  10 mg Oral Daily  . fenofibrate  160 mg Oral q1800  . heparin  5,000 Units Subcutaneous Q8H  . insulin aspart  0-20 Units Subcutaneous TID WC  . insulin aspart  0-5 Units Subcutaneous QHS  . insulin aspart  6 Units Subcutaneous TID WC  . insulin glargine  50 Units Subcutaneous Daily  . isosorbide mononitrate  30 mg Oral Daily  . mirtazapine  30 mg Oral QHS  . mometasone-formoterol  2 puff Inhalation BID  . polyethylene glycol  17 g  Oral Daily  . potassium chloride  40 mEq Oral BID  . prednisoLONE acetate  1 drop Right Eye QID  . predniSONE  20 mg Oral Q breakfast  . senna-docusate  2 tablet Oral BID  . sodium bicarbonate  1,300 mg Oral TID  . sodium chloride flush  3 mL Intravenous Q12H  . topiramate  25 mg Oral Daily  . cyanocobalamin  1,000 mcg Oral Daily   sodium chloride, albuterol, bisacodyl, cyclobenzaprine, ipratropium-albuterol, sodium chloride flush  Assessment/ Plan:  59 y.o. female with past medical history of congestive heart failure, diabetes mellitus type 2 with chronic kidney disease, hypertension, hyperlipidemia, chronic kidney disease stage IV with diabetic nephropathy and interstitial nephritis, history of brain tumor, left eye blindness who presented with increasing shortness of breath and lower extremity edema.   # Chronic kidney disease stage IV #proteinuria #mild interstitial nephritis.  #Bilateral lower extremity edema  Lab Results  Component Value Date   CREATININE 3.90 (H) 11/05/2020   CREATININE 3.79 (H) 11/04/2020   CREATININE 3.54 (H) 11/03/2020   Creatinine levels getting progressively worse Urine output stays adequate, 2650 ml for the preceding 24 hours We will continue Furosemide infusion No acute indication for dialysis, will continue monitoring renal function closely  #Anemia of chronic kidney disease.   Lab Results  Component Value Date   HGB 9.1 (L) 11/01/2020    # Diabetes mellitus type 2 with chronic kidney disease Lab Results  Component Value Date   HGBA1C 8.6 (H) 10/11/2020   Blood glucose readings stay above goal Patient is on Insulin Aspart and Insulin Glargine  #Metabolic Acidosis  CO2 stays stable, 26 today Will discontinue Sodium Bicarbonate    LOS: 4 Jaysean Manville 11/17/202112:21 PM

## 2020-11-05 NOTE — Progress Notes (Signed)
Mobility Specialist - Progress Note   11/05/20 1459  Mobility  Activity Ambulated in room;Ambulated in hall  Level of Assistance Modified independent, requires aide device or extra time (CGA for safety)  Assistive Device Front wheel walker  Distance Ambulated (ft) 180 ft  Mobility Response Tolerated well  Mobility performed by Mobility specialist  $Mobility charge 1 Mobility    Pre-mobility: 65 HR, 100% SpO2 During mobility: 79 HR, 96% SpO2 Post-mobility: 69 HR, 96% SpO2   Pt sitting on recliner w/ nursing present in room upon arrival. Pt agreed to session. Pt ambulated 180' total in room and hallway mod. Independently w/ RW. CGA utilized for safety. No LOB noted, however, pt states legs "feeling wobbly". Pt c/o slight SOB. O2 sat > 95% t/o session. Pt on RA. SOB managed by PLB. Overall, pt tolerated session very well. Pt pleasant and motivated t/o session. Requested to go back to bed at the end of session. Pt left laying in bed w/ all needs placed in reach.     Kaitlan Bin Mobility Specialist  11/05/20, 3:04 PM

## 2020-11-05 NOTE — Progress Notes (Addendum)
PROGRESS NOTE    Terry Arroyo  QJJ:[id number] DOB: 1961/10/13 DOA: 11/01/2020 PCP: Kirk Ruths, MD   Chief complaint.  Shortness of breath. Brief Narrative:  Patient is a 59 year old female with history of type 2 diabetes, essential hypertension, history of CVA, coronary disease, chronic systolic congestive heart failure with ejection fraction 30-35% (03/21).  She came to the hospital complaining increase short of breath, chest pain, and leg edema.  She also has worsening renal function.  She has been seen by nephrology and cardiology, she is started on IV Lasix drip.   Assessment & Plan:   Principal Problem:   Chest pain Active Problems:   DM2 (diabetes mellitus, type 2) (HCC)   Leg edema   COPD exacerbation (HCC)   SOB (shortness of breath)   Nephrotic syndrome  #1.  Acute on chronic systolic congestive heart failure with ejection fraction 30 to 35%. Patient is a followed by cardiology. Still on IV Lasix drip. Renal function is worse today, discussed with nephrology, patient still has significant short of breath, consider giving albumin while patient albumin level 2.4.  #2.  Chronic kidney disease stage IV/V.  Nephrotic syndrome. Followed by nephrology.  #3.  Chest pain. Followed by cardiology.  Chest pain most likely due to congestive heart failure and short of breath. No additional chest pain since admitted to the hospital.   #4.  COPD. Appear to be stable.  5.  Essential hypertension. Continue home medicines.  #6.  Type 2 diabetes uncontrolled with hyperglycemia. Patient on high dose of Lantus.  Continue sliding scale insulin.  7.  Obstructive sleep apnea. Not on CPAP at home.    DVT prophylaxis: Heparin Code Status: Full Family Communication: None Disposition Plan:  .   Status is: Inpatient  Remains inpatient appropriate because:Inpatient level of care appropriate due to severity of illness   Dispo: The patient is from: Home               Anticipated d/c is to: Home              Anticipated d/c date is: 3 days              Patient currently is not medically stable to d/c.        I/O last 3 completed shifts: In: 2225.5 [P.O.:1840; I.V.:78; IV Piggyback:307.5] Out: 3750 [Urine:3750] Total I/O In: 240 [P.O.:240] Out: -      Consultants:   Nephrology and cardiology.  Procedures: None  Antimicrobials: None  Subjective: Patient still has significant short of breath when talking to me.  Cough, nonproductive. Patient also has significant constipation, but no nausea vomiting or abdominal pain. No fever or chills. No dysuria hematuria.  Objective: Vitals:   11/04/20 1925 11/05/20 0400 11/05/20 0807 11/05/20 0827  BP: (!) 158/83 (!) 176/86 (!) 157/82   Pulse: 67 66 67   Resp: 19 18 20    Temp: 98 F (36.7 C) 98.1 F (36.7 C) 97.7 F (36.5 C)   TempSrc: Oral Oral Oral   SpO2: 98% 100% 100% 97%  Weight:  84.5 kg    Height:        Intake/Output Summary (Last 24 hours) at 11/05/2020 1046 Last data filed at 11/05/2020 0807 Gross per 24 hour  Intake 1622.5 ml  Output 1950 ml  Net -327.5 ml   Filed Weights   11/04/20 0259 11/04/20 0712 11/05/20 0400  Weight: 88.3 kg 85.1 kg 84.5 kg    Examination:  General exam: Appears calm  and comfortable  Respiratory system: Decreased breathing sounds.Marland Kitchen Respiratory effort normal. Cardiovascular system: S1 & S2 heard, RRR. No JVD, murmurs, rubs, gallops or clicks.  Gastrointestinal system: Abdomen is nondistended, soft and nontender. No organomegaly or masses felt. Normal bowel sounds heard. Central nervous system: Alert and oriented. No focal neurological deficits. Extremities: 2+ bilateral lower extremity edema. Skin: No rashes, lesions or ulcers Psychiatry: Mood & affect appropriate.     Data Reviewed: I have personally reviewed following labs and imaging studies  CBC: Recent Labs  Lab 11/01/20 1520  WBC 11.7*  HGB 9.1*  HCT 28.0*  MCV 89.5  PLT  308*   Basic Metabolic Panel: Recent Labs  Lab 11/01/20 1520 11/01/20 1520 11/01/20 2323 11/02/20 0935 11/03/20 0541 11/04/20 0543 11/05/20 0548  NA 134*  --   --  139 141 140 143  K 3.0*  --   --  3.4* 3.3* 3.7 4.0  CL 101  --   --  107 105 105 106  CO2 22  --   --  23 25 27 26   GLUCOSE 520*  --   --  343* 345* 211* 170*  BUN 60*  --   --  52* 55* 58* 70*  CREATININE 3.30*   < > 3.14* 3.36* 3.54* 3.79* 3.90*  CALCIUM 8.0*  --   --  7.9* 8.3* 8.1* 8.8*  MG  --   --   --  1.7 2.2 2.0 1.9   < > = values in this interval not displayed.   GFR: Estimated Creatinine Clearance: 17.2 mL/min (A) (by C-G formula based on SCr of 3.9 mg/dL (H)). Liver Function Tests: No results for input(s): AST, ALT, ALKPHOS, BILITOT, PROT, ALBUMIN in the last 168 hours. No results for input(s): LIPASE, AMYLASE in the last 168 hours. No results for input(s): AMMONIA in the last 168 hours. Coagulation Profile: No results for input(s): INR, PROTIME in the last 168 hours. Cardiac Enzymes: No results for input(s): CKTOTAL, CKMB, CKMBINDEX, TROPONINI in the last 168 hours. BNP (last 3 results) No results for input(s): PROBNP in the last 8760 hours. HbA1C: No results for input(s): HGBA1C in the last 72 hours. CBG: Recent Labs  Lab 11/04/20 0805 11/04/20 1140 11/04/20 1614 11/04/20 1936 11/05/20 0808  GLUCAP 220* 257* 123* 122* 159*   Lipid Profile: No results for input(s): CHOL, HDL, LDLCALC, TRIG, CHOLHDL, LDLDIRECT in the last 72 hours. Thyroid Function Tests: No results for input(s): TSH, T4TOTAL, FREET4, T3FREE, THYROIDAB in the last 72 hours. Anemia Panel: No results for input(s): VITAMINB12, FOLATE, FERRITIN, TIBC, IRON, RETICCTPCT in the last 72 hours. Sepsis Labs: No results for input(s): PROCALCITON, LATICACIDVEN in the last 168 hours.  Recent Results (from the past 240 hour(s))  Respiratory Panel by RT PCR (Flu A&B, Covid) - Nasopharyngeal Swab     Status: None   Collection Time:  11/01/20  9:18 PM   Specimen: Nasopharyngeal Swab  Result Value Ref Range Status   SARS Coronavirus 2 by RT PCR NEGATIVE NEGATIVE Final    Comment: (NOTE) SARS-CoV-2 target nucleic acids are NOT DETECTED.  The SARS-CoV-2 RNA is generally detectable in upper respiratoy specimens during the acute phase of infection. The lowest concentration of SARS-CoV-2 viral copies this assay can detect is 131 copies/mL. A negative result does not preclude SARS-Cov-2 infection and should not be used as the sole basis for treatment or other patient management decisions. A negative result may occur with  improper specimen collection/handling, submission of specimen other than nasopharyngeal  swab, presence of viral mutation(s) within the areas targeted by this assay, and inadequate number of viral copies (<131 copies/mL). A negative result must be combined with clinical observations, patient history, and epidemiological information. The expected result is Negative.  Fact Sheet for Patients:  PinkCheek.be  Fact Sheet for Healthcare Providers:  GravelBags.it  This test is no t yet approved or cleared by the [place] FDA and  has been authorized for detection and/or diagnosis of SARS-CoV-2 by FDA under an Emergency Use Authorization (EUA). This EUA will remain  in effect (meaning this test can be used) for the duration of the COVID-19 declaration under Section 564(b)(1) of the Act, 21 U.S.C. section 360bbb-3(b)(1), unless the authorization is terminated or revoked sooner.     Influenza A by PCR NEGATIVE NEGATIVE Final   Influenza B by PCR NEGATIVE NEGATIVE Final    Comment: (NOTE) The Xpert Xpress SARS-CoV-2/FLU/RSV assay is intended as an aid in  the diagnosis of influenza from Nasopharyngeal swab specimens and  should not be used as a sole basis for treatment. Nasal washings and  aspirates are unacceptable for Xpert Xpress  SARS-CoV-2/FLU/RSV  testing.  Fact Sheet for Patients: PinkCheek.be  Fact Sheet for Healthcare Providers: GravelBags.it  This test is not yet approved or cleared by the [place] FDA and  has been authorized for detection and/or diagnosis of SARS-CoV-2 by  FDA under an Emergency Use Authorization (EUA). This EUA will remain  in effect (meaning this test can be used) for the duration of the  Covid-19 declaration under Section 564(b)(1) of the Act, 21  U.S.C. section 360bbb-3(b)(1), unless the authorization is  terminated or revoked. Performed at [facility], [address] [place], [place] [address]          Radiology Studies: No results found.      Scheduled Meds: . amLODipine  10 mg Oral Daily  . aspirin EC  81 mg Oral Daily  . atorvastatin  80 mg Oral q1800  . carvedilol  25 mg Oral BID WC  . famotidine  10 mg Oral Daily  . fenofibrate  160 mg Oral q1800  . heparin  5,000 Units Subcutaneous Q8H  . insulin aspart  0-20 Units Subcutaneous TID WC  . insulin aspart  0-5 Units Subcutaneous QHS  . insulin aspart  6 Units Subcutaneous TID WC  . insulin glargine  50 Units Subcutaneous Daily  . isosorbide mononitrate  30 mg Oral Daily  . lactulose  20 g Oral Once  . mirtazapine  30 mg Oral QHS  . mometasone-formoterol  2 puff Inhalation BID  . polyethylene glycol  17 g Oral Daily  . potassium chloride  40 mEq Oral BID  . prednisoLONE acetate  1 drop Right Eye QID  . predniSONE  20 mg Oral Q breakfast  . senna-docusate  2 tablet Oral BID  . sodium bicarbonate  1,300 mg Oral TID  . sodium chloride flush  3 mL Intravenous Q12H  . topiramate  25 mg Oral Daily  . cyanocobalamin  1,000 mcg Oral Daily   Continuous Infusions: . sodium chloride 7 mL/hr at 11/03/20 1800  . albumin human 25 g (11/05/20 0525)  . furosemide (LASIX) 200 mg in dextrose 5% 100 mL (2mg /mL) infusion 6 mg/hr (11/04/20 1415)      LOS: 4 days    Time spent: 27 minutes    Sharen Hones, MD [facility]   To contact the attending provider between 7A-7P or the covering provider during after  hours 7P-7A, please log into the web site [email or web address] and access using universal [place] password for that web site. If you do not have the password, please call the hospital operator.  11/05/2020, 10:46 AM

## 2020-11-05 NOTE — Plan of Care (Signed)
  Problem: Health Behavior/Discharge Planning: Goal: Ability to manage health-related needs will improve Outcome: Progressing   Problem: Clinical Measurements: Goal: Will remain free from infection Outcome: Progressing   Problem: Clinical Measurements: Goal: Respiratory complications will improve Outcome: Progressing   

## 2020-11-06 ENCOUNTER — Other Ambulatory Visit (INDEPENDENT_AMBULATORY_CARE_PROVIDER_SITE_OTHER): Payer: Self-pay | Admitting: Nurse Practitioner

## 2020-11-06 DIAGNOSIS — I5023 Acute on chronic systolic (congestive) heart failure: Secondary | ICD-10-CM | POA: Diagnosis not present

## 2020-11-06 DIAGNOSIS — N049 Nephrotic syndrome with unspecified morphologic changes: Secondary | ICD-10-CM | POA: Diagnosis not present

## 2020-11-06 DIAGNOSIS — N183 Chronic kidney disease, stage 3 unspecified: Secondary | ICD-10-CM | POA: Diagnosis not present

## 2020-11-06 DIAGNOSIS — E1122 Type 2 diabetes mellitus with diabetic chronic kidney disease: Secondary | ICD-10-CM | POA: Diagnosis not present

## 2020-11-06 LAB — BASIC METABOLIC PANEL
Anion gap: 15 (ref 5–15)
BUN: 71 mg/dL — ABNORMAL HIGH (ref 6–20)
CO2: 24 mmol/L (ref 22–32)
Calcium: 9.1 mg/dL (ref 8.9–10.3)
Chloride: 104 mmol/L (ref 98–111)
Creatinine, Ser: 4.32 mg/dL — ABNORMAL HIGH (ref 0.44–1.00)
GFR, Estimated: 11 mL/min — ABNORMAL LOW (ref >60)
Glucose, Bld: 193 mg/dL — ABNORMAL HIGH (ref 70–99)
Potassium: 4.8 mmol/L (ref 3.5–5.1)
Sodium: 143 mmol/L (ref 135–145)

## 2020-11-06 LAB — GLUCOSE, CAPILLARY
Glucose-Capillary: 138 mg/dL — ABNORMAL HIGH (ref 70–99)
Glucose-Capillary: 140 mg/dL — ABNORMAL HIGH (ref 70–99)
Glucose-Capillary: 199 mg/dL — ABNORMAL HIGH (ref 70–99)
Glucose-Capillary: 220 mg/dL — ABNORMAL HIGH (ref 70–99)

## 2020-11-06 LAB — MAGNESIUM: Magnesium: 1.8 mg/dL (ref 1.7–2.4)

## 2020-11-06 MED ORDER — INSULIN GLARGINE 100 UNIT/ML ~~LOC~~ SOLN
30.0000 [IU] | Freq: Every day | SUBCUTANEOUS | Status: DC
  Administered 2020-11-08 – 2020-11-12 (×5): 30 [IU] via SUBCUTANEOUS
  Filled 2020-11-06 (×7): qty 0.3

## 2020-11-06 MED ORDER — INSULIN ASPART 100 UNIT/ML ~~LOC~~ SOLN
4.0000 [IU] | Freq: Three times a day (TID) | SUBCUTANEOUS | Status: DC
  Administered 2020-11-06 – 2020-11-12 (×14): 4 [IU] via SUBCUTANEOUS
  Filled 2020-11-06 (×15): qty 1

## 2020-11-06 NOTE — Progress Notes (Signed)
[facility]  ROUNDING NOTE   Subjective:  Patient getting attended by occupational therapist, ambulating using a rolling walker. She is tearful and appears to be overwhelmed with her multiple medical conditions.  Patient's renal function keeps deteriorating, will have to consider dialysis at this point. Dr.Lateef discussed the need for dialysis with the patient.Patient expressed willingness to proceed with dialysis plan.  Objective:  Vital signs in last 24 hours:  Temp:  [98 F (36.7 C)-98.2 F (36.8 C)] 98.2 F (36.8 C) (11/18 1138) Pulse Rate:  [63-65] 63 (11/18 1138) Resp:  [16-19] 18 (11/18 1138) BP: (136-159)/(73-83) 136/76 (11/18 1138) SpO2:  [95 %-100 %] 100 % (11/18 1138) Weight:  [84 kg] 84 kg (11/18 0100)  Weight change: -1.134 kg Filed Weights   11/04/20 0712 11/05/20 0400 11/06/20 0100  Weight: 85.1 kg 84.5 kg 84 kg    Intake/Output: I/O last 3 completed shifts: In: 540 [P.O.:540] Out: 2850 [Urine:2850]   Intake/Output this shift:  No intake/output data recorded.  Physical Exam: General:  Awake, alert, flat affect  Head:  Normocephalic,atraumatic  Eyes: Sclerae and conjunctivae clear  Lungs:   Respirations symmetrical, Dyspnea with exertion +, lungs diminished at the bases  Heart:  Regular rate and rhythm  Abdomen:   Soft, nontender, nondistended  Extremities:  2+ peripheral edema.  Neurologic:  Oriented x3   Skin:  No acute lesions or rashes    Basic Metabolic Panel: Recent Labs  Lab 11/02/20 0935 11/02/20 0935 11/03/20 0541 11/03/20 0541 11/04/20 0543 11/05/20 0548 11/06/20 0533  NA 139  --  141  --  140 143 143  K 3.4*  --  3.3*  --  3.7 4.0 4.8  CL 107  --  105  --  105 106 104  CO2 23  --  25  --  27 26 24   GLUCOSE 343*  --  345*  --  211* 170* 193*  BUN 52*  --  55*  --  58* 70* 71*  CREATININE 3.36*  --  3.54*  --  3.79* 3.90* 4.32*  CALCIUM 7.9*   < > 8.3*   < > 8.1* 8.8* 9.1  MG 1.7  --  2.2  --  2.0 1.9 1.8   < > =  values in this interval not displayed.    Liver Function Tests: No results for input(s): AST, ALT, ALKPHOS, BILITOT, PROT, ALBUMIN in the last 168 hours. No results for input(s): LIPASE, AMYLASE in the last 168 hours. No results for input(s): AMMONIA in the last 168 hours.  CBC: Recent Labs  Lab 11/01/20 1520  WBC 11.7*  HGB 9.1*  HCT 28.0*  MCV 89.5  PLT 469*    Cardiac Enzymes: No results for input(s): CKTOTAL, CKMB, CKMBINDEX, TROPONINI in the last 168 hours.  BNP: Invalid input(s): POCBNP  CBG: Recent Labs  Lab 11/05/20 1129 11/05/20 1628 11/05/20 2043 11/06/20 0810 11/06/20 1139  GLUCAP 132* 130* 141* 138* 140*    Microbiology: Results for orders placed or performed during the hospital encounter of 11/01/20  Respiratory Panel by RT PCR (Flu A&B, Covid) - Nasopharyngeal Swab     Status: None   Collection Time: 11/01/20  9:18 PM   Specimen: Nasopharyngeal Swab  Result Value Ref Range Status   SARS Coronavirus 2 by RT PCR NEGATIVE NEGATIVE Final    Comment: (NOTE) SARS-CoV-2 target nucleic acids are NOT DETECTED.  The SARS-CoV-2 RNA is generally detectable in upper respiratoy specimens during the acute phase of infection. The  lowest concentration of SARS-CoV-2 viral copies this assay can detect is 131 copies/mL. A negative result does not preclude SARS-Cov-2 infection and should not be used as the sole basis for treatment or other patient management decisions. A negative result may occur with  improper specimen collection/handling, submission of specimen other than nasopharyngeal swab, presence of viral mutation(s) within the areas targeted by this assay, and inadequate number of viral copies (<131 copies/mL). A negative result must be combined with clinical observations, patient history, and epidemiological information. The expected result is Negative.  Fact Sheet for Patients:  PinkCheek.be  Fact Sheet for Healthcare  Providers:  GravelBags.it  This test is no t yet approved or cleared by the [place] FDA and  has been authorized for detection and/or diagnosis of SARS-CoV-2 by FDA under an Emergency Use Authorization (EUA). This EUA will remain  in effect (meaning this test can be used) for the duration of the COVID-19 declaration under Section 564(b)(1) of the Act, 21 U.S.C. section 360bbb-3(b)(1), unless the authorization is terminated or revoked sooner.     Influenza A by PCR NEGATIVE NEGATIVE Final   Influenza B by PCR NEGATIVE NEGATIVE Final    Comment: (NOTE) The Xpert Xpress SARS-CoV-2/FLU/RSV assay is intended as an aid in  the diagnosis of influenza from Nasopharyngeal swab specimens and  should not be used as a sole basis for treatment. Nasal washings and  aspirates are unacceptable for Xpert Xpress SARS-CoV-2/FLU/RSV  testing.  Fact Sheet for Patients: PinkCheek.be  Fact Sheet for Healthcare Providers: GravelBags.it  This test is not yet approved or cleared by the [place] FDA and  has been authorized for detection and/or diagnosis of SARS-CoV-2 by  FDA under an Emergency Use Authorization (EUA). This EUA will remain  in effect (meaning this test can be used) for the duration of the  Covid-19 declaration under Section 564(b)(1) of the Act, 21  U.S.C. section 360bbb-3(b)(1), unless the authorization is  terminated or revoked. Performed at [facility], [address] [place], [place] [address]     Coagulation Studies: No results for input(s): LABPROT, INR in the last 72 hours.  Urinalysis: No results for input(s): COLORURINE, LABSPEC, PHURINE, GLUCOSEU, HGBUR, BILIRUBINUR, KETONESUR, PROTEINUR, UROBILINOGEN, NITRITE, LEUKOCYTESUR in the last 72 hours.  Invalid input(s): APPERANCEUR    Imaging: No results found.   Medications:   . sodium chloride 7 mL/hr at  11/03/20 1800  . albumin human 25 g (11/06/20 0545)  . furosemide (LASIX) 200 mg in dextrose 5% 100 mL (2mg /mL) infusion 6 mg/hr (11/06/20 0037)   . amLODipine  10 mg Oral Daily  . aspirin EC  81 mg Oral Daily  . atorvastatin  80 mg Oral q1800  . carvedilol  25 mg Oral BID WC  . famotidine  10 mg Oral Daily  . fenofibrate  160 mg Oral q1800  . heparin  5,000 Units Subcutaneous Q8H  . insulin aspart  0-20 Units Subcutaneous TID WC  . insulin aspart  0-5 Units Subcutaneous QHS  . insulin aspart  4 Units Subcutaneous TID WC  . [START ON 11/07/2020] insulin glargine  30 Units Subcutaneous Daily  . isosorbide mononitrate  30 mg Oral Daily  . mirtazapine  30 mg Oral QHS  . mometasone-formoterol  2 puff Inhalation BID  . polyethylene glycol  17 g Oral Daily  . prednisoLONE acetate  1 drop Right Eye QID  . predniSONE  20 mg Oral Q breakfast  . senna-docusate  2 tablet Oral BID  .  sodium chloride flush  3 mL Intravenous Q12H  . topiramate  25 mg Oral Daily  . cyanocobalamin  1,000 mcg Oral Daily   sodium chloride, albuterol, bisacodyl, cyclobenzaprine, ipratropium-albuterol, sodium chloride flush  Assessment/ Plan:  59 y.o. female with past medical history of congestive heart failure, diabetes mellitus type 2 with chronic kidney disease, hypertension, hyperlipidemia, chronic kidney disease stage IV with diabetic nephropathy and interstitial nephritis, history of brain tumor, left eye blindness who presented with increasing shortness of breath and lower extremity edema.   # Chronic kidney disease stage IV #proteinuria #mild interstitial nephritis.  #Bilateral lower extremity edema  Lab Results  Component Value Date   CREATININE 4.32 (H) 11/06/2020   CREATININE 3.90 (H) 11/05/2020   CREATININE 3.79 (H) 11/04/2020  Patient's renal function worsening Planning for initiation of dialysis tomorrow Vascular consulted for Permcath placement NPO after midnight tonight  #Anemia of chronic  kidney disease.   Lab Results  Component Value Date   HGB 9.1 (L) 11/01/2020    # Diabetes mellitus type 2 with chronic kidney disease Lab Results  Component Value Date   HGBA1C 8.6 (H) 10/11/2020   Controlled with long acting and short acting insulins Patient is on prednisone Diabetic management per primary team  #Metabolic Acidosis  CO2 24 today Sodium bicarbonate discontinued yesterday    LOS: 5 Terry Arroyo 11/18/202112:44 PM

## 2020-11-06 NOTE — Progress Notes (Signed)
Upon arrival at bedside, pt complaining of pain and swelling in left forearm. Assessed arm which is swollen with hardening of undetermined size; patient yelled in pain when VAST RN attempted to palpate area. Elevated left forearm on several blankets (above level of heart) and applied ice pack. Informed pt's nurse of findings and actions.  Assessed vasculature of right arm utilizing ultrasound. No appropriate veins visualized for IV placement. Assessed veins in hand which are small and tortuous. Advised pt's nurse of findings; also advised that left arm is too swollen and painful to assess at this time.

## 2020-11-06 NOTE — Progress Notes (Signed)
Terry Arroyo visited pt. per OR from [facility] for prayer and support for pt.; [facility] visited w/pt. two weeks ago during a prior admission.  Pt. sitting on side of bed watching TV when [facility] entered; she appeared very dejected and shared that she is feeling 'depressed' because she was told she must begin dialysis tomorrow.  The prospect of dialysis is both discouraging and frightening to pt.  She asked E[place] several times whether the procedure to place dialysis port will be painful and whether dialysis hurts; she is not aware of whether dialysis will be temporary or permanent.  Pt sees need for dialysis as most recent setback in a long series of recent serious health issues including bout of COVID 19, two heart attacks, and a stroke.  Pt. wonders whether she did the right thing in recently moving out of her father's house to a retirement community near [employer]; she feels that it would be good to have someone living w/her current health issues.   Volin paged to other rm. but prayed for pt. at end of visit.  Chaplains remain available as needed to support pt.

## 2020-11-06 NOTE — Progress Notes (Signed)
Mobility Specialist - Progress Note   11/06/20 1330  Mobility  Activity Refused mobility  Mobility performed by Mobility specialist    Attempted mobility session earlier this date, pt refused d/t eating lunch. Re-attempted session at this time, pt refused d/t "hurting all over". States she's "tired" and "feeling achy". Will hold off this date and re-attempt at a later date when pt is feeling appropriate.    Maude Gloor Mobility Specialist  11/06/20, 1:32 PM

## 2020-11-06 NOTE — Progress Notes (Addendum)
VAST consulted to obtain 2nd IV access. Pt is receiving continuous lasix drip and albumin administration every 8 hrs. Pt has had 4 IV's in 4 days and is a difficult access patient according to VAST colleagues. Arrived at bedside to assess patient's vasculature utilizing ultrasound. Patient attempting to eat breakfast and crying. Inquired what was happening and pt sobbed "I have to go on dialysis".  Advised pt VAST RN would return after she's had a chance to finish breakfast. Advised unit nurse of visit and discussion.

## 2020-11-06 NOTE — Progress Notes (Signed)
PROGRESS NOTE    Terry Arroyo  LYY:[id number] DOB: Mar 02, 1961 DOA: 11/01/2020 PCP: Kirk Ruths, MD   Complaint for shortness of breath. Brief Narrative:  Patient is a 59 year old female with history of type 2 diabetes, essential hypertension, history of CVA, coronary disease, chronic systolic congestive heart failure with ejection fraction 30-35% (03/21).  She came to the hospital complaining increase short of breath, chest pain, and leg edema.  She also has worsening renal function.  She has been seen by nephrology and cardiology, she is started on IV Lasix drip. Patient renal function is getting worse, short of breath is not getting better.  Permacath is placed on 11/19 for dialysis.   Assessment & Plan:   Principal Problem:   Chest pain Active Problems:   DM2 (diabetes mellitus, type 2) (HCC)   Leg edema   COPD exacerbation (HCC)   SOB (shortness of breath)   Nephrotic syndrome  #1.  Acute on chronic systolic congestive heart failure with ejection fraction 30 to 35%. Patient still has significant short of breath, but renal function is getting worse.  Planning for dialysis tomorrow.  2.  Chronic kidney stage IV/V.  Nephrotic syndrome. Renal function gradually getting worse after diuretics.  Permacath will be placed tomorrow for dialysis.  3.  Chest pain. Likely due to congestive heart failure and shortness of breath.  4.  COPD. No bronchospasm.  5.  Essential hypertension.  Continue home medicines.  6.  Type 2 diabetes uncontrolled with hyperglycemia. Will reduce insulin dose as patient renal function continued to deteriorate.    DVT prophylaxis: Heparin Code Status: Full Family Communication: father updated Disposition Plan:  .   Status is: Inpatient  Remains inpatient appropriate because:Inpatient level of care appropriate due to severity of illness   Dispo: The patient is from: Home              Anticipated d/c is to: Home               Anticipated d/c date is: > 3 days              Patient currently is not medically stable to d/c.        I/O last 3 completed shifts: In: 540 [P.O.:540] Out: 2850 [Urine:2850] No intake/output data recorded.     Consultants:   nephrology  Procedures: none  Antimicrobials: none  Subjective: Patient still has significant shortness of breath, no hypoxia. No cough. No chest pain No abdominal pain.  No fever or chills No headache or dizziness  Objective: Vitals:   11/05/20 2041 11/06/20 0100 11/06/20 0406 11/06/20 0808  BP: (!) 147/83  (!) 159/78 (!) 146/78  Pulse: 65  63 64  Resp: 16  18 19   Temp: 98 F (36.7 C)  98.1 F (36.7 C) 98.1 F (36.7 C)  TempSrc: Oral  Oral Oral  SpO2: 100%  100% 98%  Weight:  84 kg    Height:        Intake/Output Summary (Last 24 hours) at 11/06/2020 1050 Last data filed at 11/05/2020 1902 Gross per 24 hour  Intake --  Output 1500 ml  Net -1500 ml   Filed Weights   11/04/20 0712 11/05/20 0400 11/06/20 0100  Weight: 85.1 kg 84.5 kg 84 kg    Examination:  General exam: Appears calm and comfortable  Respiratory system: Decreased breathing sounds.Marland Kitchen Respiratory effort normal. Cardiovascular system: S1 & S2 heard, RRR. No JVD, murmurs, rubs, gallops or clicks. 1+ pedal edema. Gastrointestinal  system: Abdomen is nondistended, soft and nontender. No organomegaly or masses felt. Normal bowel sounds heard. Central nervous system: Alert and oriented. No focal neurological deficits. Extremities: Symmetric Skin: No rashes, lesions or ulcers Psychiatry:  Mood & affect appropriate.     Data Reviewed: I have personally reviewed following labs and imaging studies  CBC: Recent Labs  Lab 11/01/20 1520  WBC 11.7*  HGB 9.1*  HCT 28.0*  MCV 89.5  PLT 976*   Basic Metabolic Panel: Recent Labs  Lab 11/02/20 0935 11/03/20 0541 11/04/20 0543 11/05/20 0548 11/06/20 0533  NA 139 141 140 143 143  K 3.4* 3.3* 3.7 4.0 4.8  CL 107  105 105 106 104  CO2 23 25 27 26 24   GLUCOSE 343* 345* 211* 170* 193*  BUN 52* 55* 58* 70* 71*  CREATININE 3.36* 3.54* 3.79* 3.90* 4.32*  CALCIUM 7.9* 8.3* 8.1* 8.8* 9.1  MG 1.7 2.2 2.0 1.9 1.8   GFR: Estimated Creatinine Clearance: 15.5 mL/min (A) (by C-G formula based on SCr of 4.32 mg/dL (H)). Liver Function Tests: No results for input(s): AST, ALT, ALKPHOS, BILITOT, PROT, ALBUMIN in the last 168 hours. No results for input(s): LIPASE, AMYLASE in the last 168 hours. No results for input(s): AMMONIA in the last 168 hours. Coagulation Profile: No results for input(s): INR, PROTIME in the last 168 hours. Cardiac Enzymes: No results for input(s): CKTOTAL, CKMB, CKMBINDEX, TROPONINI in the last 168 hours. BNP (last 3 results) No results for input(s): PROBNP in the last 8760 hours. HbA1C: No results for input(s): HGBA1C in the last 72 hours. CBG: Recent Labs  Lab 11/05/20 0808 11/05/20 1129 11/05/20 1628 11/05/20 2043 11/06/20 0810  GLUCAP 159* 132* 130* 141* 138*   Lipid Profile: No results for input(s): CHOL, HDL, LDLCALC, TRIG, CHOLHDL, LDLDIRECT in the last 72 hours. Thyroid Function Tests: No results for input(s): TSH, T4TOTAL, FREET4, T3FREE, THYROIDAB in the last 72 hours. Anemia Panel: No results for input(s): VITAMINB12, FOLATE, FERRITIN, TIBC, IRON, RETICCTPCT in the last 72 hours. Sepsis Labs: No results for input(s): PROCALCITON, LATICACIDVEN in the last 168 hours.  Recent Results (from the past 240 hour(s))  Respiratory Panel by RT PCR (Flu A&B, Covid) - Nasopharyngeal Swab     Status: None   Collection Time: 11/01/20  9:18 PM   Specimen: Nasopharyngeal Swab  Result Value Ref Range Status   SARS Coronavirus 2 by RT PCR NEGATIVE NEGATIVE Final    Comment: (NOTE) SARS-CoV-2 target nucleic acids are NOT DETECTED.  The SARS-CoV-2 RNA is generally detectable in upper respiratoy specimens during the acute phase of infection. The lowest concentration of  SARS-CoV-2 viral copies this assay can detect is 131 copies/mL. A negative result does not preclude SARS-Cov-2 infection and should not be used as the sole basis for treatment or other patient management decisions. A negative result may occur with  improper specimen collection/handling, submission of specimen other than nasopharyngeal swab, presence of viral mutation(s) within the areas targeted by this assay, and inadequate number of viral copies (<131 copies/mL). A negative result must be combined with clinical observations, patient history, and epidemiological information. The expected result is Negative.  Fact Sheet for Patients:  PinkCheek.be  Fact Sheet for Healthcare Providers:  GravelBags.it  This test is no t yet approved or cleared by the [place] FDA and  has been authorized for detection and/or diagnosis of SARS-CoV-2 by FDA under an Emergency Use Authorization (EUA). This EUA will remain  in effect (meaning this test can be  used) for the duration of the COVID-19 declaration under Section 564(b)(1) of the Act, 21 U.S.C. section 360bbb-3(b)(1), unless the authorization is terminated or revoked sooner.     Influenza A by PCR NEGATIVE NEGATIVE Final   Influenza B by PCR NEGATIVE NEGATIVE Final    Comment: (NOTE) The Xpert Xpress SARS-CoV-2/FLU/RSV assay is intended as an aid in  the diagnosis of influenza from Nasopharyngeal swab specimens and  should not be used as a sole basis for treatment. Nasal washings and  aspirates are unacceptable for Xpert Xpress SARS-CoV-2/FLU/RSV  testing.  Fact Sheet for Patients: PinkCheek.be  Fact Sheet for Healthcare Providers: GravelBags.it  This test is not yet approved or cleared by the [place] FDA and  has been authorized for detection and/or diagnosis of SARS-CoV-2 by  FDA under an Emergency Use  Authorization (EUA). This EUA will remain  in effect (meaning this test can be used) for the duration of the  Covid-19 declaration under Section 564(b)(1) of the Act, 21  U.S.C. section 360bbb-3(b)(1), unless the authorization is  terminated or revoked. Performed at [facility], [address], [place], [place] [address]          Radiology Studies: No results found.      Scheduled Meds: . amLODipine  10 mg Oral Daily  . aspirin EC  81 mg Oral Daily  . atorvastatin  80 mg Oral q1800  . carvedilol  25 mg Oral BID WC  . famotidine  10 mg Oral Daily  . fenofibrate  160 mg Oral q1800  . heparin  5,000 Units Subcutaneous Q8H  . insulin aspart  0-20 Units Subcutaneous TID WC  . insulin aspart  0-5 Units Subcutaneous QHS  . insulin aspart  6 Units Subcutaneous TID WC  . insulin glargine  50 Units Subcutaneous Daily  . isosorbide mononitrate  30 mg Oral Daily  . mirtazapine  30 mg Oral QHS  . mometasone-formoterol  2 puff Inhalation BID  . polyethylene glycol  17 g Oral Daily  . potassium chloride  40 mEq Oral BID  . prednisoLONE acetate  1 drop Right Eye QID  . predniSONE  20 mg Oral Q breakfast  . senna-docusate  2 tablet Oral BID  . sodium chloride flush  3 mL Intravenous Q12H  . topiramate  25 mg Oral Daily  . cyanocobalamin  1,000 mcg Oral Daily   Continuous Infusions: . sodium chloride 7 mL/hr at 11/03/20 1800  . albumin human 25 g (11/06/20 0545)  . furosemide (LASIX) 200 mg in dextrose 5% 100 mL (2mg /mL) infusion 6 mg/hr (11/06/20 0037)     LOS: 5 days    Time spent: 28 minutes    Sharen Hones, MD [facility]   To contact the attending provider between 7A-7P or the covering provider during after hours 7P-7A, please log into the web site [email or web address] and access using universal [place] password for that web site. If you do not have the password, please call the hospital operator.  11/06/2020, 10:50 AM

## 2020-11-07 ENCOUNTER — Inpatient Hospital Stay
Admission: EM | Disposition: A | Discharge status: Home Health | Payer: Self-pay | Source: Home / Self Care | Attending: Internal Medicine

## 2020-11-07 ENCOUNTER — Encounter: Payer: Self-pay | Admitting: Vascular Surgery

## 2020-11-07 DIAGNOSIS — I5023 Acute on chronic systolic (congestive) heart failure: Secondary | ICD-10-CM | POA: Diagnosis not present

## 2020-11-07 DIAGNOSIS — E1122 Type 2 diabetes mellitus with diabetic chronic kidney disease: Secondary | ICD-10-CM | POA: Diagnosis not present

## 2020-11-07 DIAGNOSIS — N183 Chronic kidney disease, stage 3 unspecified: Secondary | ICD-10-CM | POA: Diagnosis not present

## 2020-11-07 DIAGNOSIS — Z992 Dependence on renal dialysis: Secondary | ICD-10-CM

## 2020-11-07 DIAGNOSIS — N049 Nephrotic syndrome with unspecified morphologic changes: Secondary | ICD-10-CM | POA: Diagnosis not present

## 2020-11-07 DIAGNOSIS — N186 End stage renal disease: Secondary | ICD-10-CM

## 2020-11-07 DIAGNOSIS — N185 Chronic kidney disease, stage 5: Secondary | ICD-10-CM

## 2020-11-07 HISTORY — PX: DIALYSIS/PERMA CATHETER INSERTION: CATH118288

## 2020-11-07 LAB — COMPREHENSIVE METABOLIC PANEL
ALT: 12 U/L (ref 0–44)
AST: 11 U/L — ABNORMAL LOW (ref 15–41)
Albumin: 3.6 g/dL (ref 3.5–5.0)
Alkaline Phosphatase: 58 U/L (ref 38–126)
Anion gap: 14 (ref 5–15)
BUN: 74 mg/dL — ABNORMAL HIGH (ref 6–20)
CO2: 25 mmol/L (ref 22–32)
Calcium: 9 mg/dL (ref 8.9–10.3)
Chloride: 101 mmol/L (ref 98–111)
Creatinine, Ser: 4.71 mg/dL — ABNORMAL HIGH (ref 0.44–1.00)
GFR, Estimated: 10 mL/min — ABNORMAL LOW (ref >60)
Glucose, Bld: 212 mg/dL — ABNORMAL HIGH (ref 70–99)
Potassium: 4.7 mmol/L (ref 3.5–5.1)
Sodium: 140 mmol/L (ref 135–145)
Total Bilirubin: 0.7 mg/dL (ref 0.3–1.2)
Total Protein: 6.4 g/dL — ABNORMAL LOW (ref 6.5–8.1)

## 2020-11-07 LAB — CBC WITH DIFFERENTIAL/PLATELET
Abs Immature Granulocytes: 0.08 10*3/uL — ABNORMAL HIGH (ref 0.00–0.07)
Basophils Absolute: 0.1 10*3/uL (ref 0.0–0.1)
Basophils Relative: 0 %
Eosinophils Absolute: 0.1 10*3/uL (ref 0.0–0.5)
Eosinophils Relative: 1 %
HCT: 24.8 % — ABNORMAL LOW (ref 36.0–46.0)
Hemoglobin: 7.9 g/dL — ABNORMAL LOW (ref 12.0–15.0)
Immature Granulocytes: 1 %
Lymphocytes Relative: 16 %
Lymphs Abs: 2.5 10*3/uL (ref 0.7–4.0)
MCH: 29.3 pg (ref 26.0–34.0)
MCHC: 31.9 g/dL (ref 30.0–36.0)
MCV: 91.9 fL (ref 80.0–100.0)
Monocytes Absolute: 1 10*3/uL (ref 0.1–1.0)
Monocytes Relative: 6 %
Neutro Abs: 11.7 10*3/uL — ABNORMAL HIGH (ref 1.7–7.7)
Neutrophils Relative %: 76 %
Platelets: 351 10*3/uL (ref 150–400)
RBC: 2.7 MIL/uL — ABNORMAL LOW (ref 3.87–5.11)
RDW: 15.2 % (ref 11.5–15.5)
WBC: 15.4 10*3/uL — ABNORMAL HIGH (ref 4.0–10.5)
nRBC: 0 % (ref 0.0–0.2)

## 2020-11-07 LAB — GLUCOSE, CAPILLARY
Glucose-Capillary: 172 mg/dL — ABNORMAL HIGH (ref 70–99)
Glucose-Capillary: 76 mg/dL (ref 70–99)
Glucose-Capillary: 134 mg/dL — ABNORMAL HIGH (ref 70–99)
Glucose-Capillary: 187 mg/dL — ABNORMAL HIGH (ref 70–99)
Glucose-Capillary: 35 mg/dL — CL (ref 70–99)
Glucose-Capillary: 122 mg/dL — ABNORMAL HIGH (ref 70–99)

## 2020-11-07 LAB — IRON AND TIBC
Iron: 44 ug/dL (ref 28–170)
Saturation Ratios: 25 % (ref 10.4–31.8)
TIBC: 174 ug/dL — ABNORMAL LOW (ref 250–450)
UIBC: 130 ug/dL

## 2020-11-07 LAB — MRSA PCR SCREENING: MRSA by PCR: NEGATIVE

## 2020-11-07 LAB — MAGNESIUM: Magnesium: 1.7 mg/dL (ref 1.7–2.4)

## 2020-11-07 LAB — VITAMIN B12: Vitamin B-12: 388 pg/mL (ref 180–914)

## 2020-11-07 SURGERY — DIALYSIS/PERMA CATHETER INSERTION
Anesthesia: Moderate Sedation

## 2020-11-07 MED ORDER — LIDOCAINE HCL (PF) 1 % IJ SOLN
5.0000 mL | INTRAMUSCULAR | Status: DC | PRN
  Filled 2020-11-07: qty 5

## 2020-11-07 MED ORDER — SODIUM CHLORIDE 0.9 % IV SOLN: INTRAVENOUS | Status: DC

## 2020-11-07 MED ORDER — METHYLPREDNISOLONE SODIUM SUCC 125 MG IJ SOLR: 125.0000 mg | Freq: Once | INTRAMUSCULAR | Status: DC | PRN

## 2020-11-07 MED ORDER — FENTANYL CITRATE (PF) 100 MCG/2ML IJ SOLN
INTRAMUSCULAR | Status: AC
  Filled 2020-11-07: qty 2

## 2020-11-07 MED ORDER — ALTEPLASE 2 MG IJ SOLR: 2.0000 mg | Freq: Once | INTRAMUSCULAR | Status: DC | PRN

## 2020-11-07 MED ORDER — SODIUM CHLORIDE 0.9 % IV SOLN: 100.0000 mL | INTRAVENOUS | Status: DC | PRN

## 2020-11-07 MED ORDER — CEFAZOLIN SODIUM-DEXTROSE 1-4 GM/50ML-% IV SOLN
INTRAVENOUS | Status: AC
  Administered 2020-11-07: 1 g via INTRAVENOUS
  Filled 2020-11-07: qty 50

## 2020-11-07 MED ORDER — ACETAMINOPHEN 325 MG PO TABS
650.0000 mg | ORAL_TABLET | Freq: Four times a day (QID) | ORAL | Status: DC | PRN
  Administered 2020-11-12: 650 mg via ORAL
  Filled 2020-11-07: qty 2

## 2020-11-07 MED ORDER — MIDAZOLAM HCL 2 MG/ML PO SYRP
8.0000 mg | ORAL_SOLUTION | Freq: Once | ORAL | Status: DC | PRN
  Filled 2020-11-07: qty 4

## 2020-11-07 MED ORDER — LIDOCAINE-PRILOCAINE 2.5-2.5 % EX CREA
1.0000 "application " | TOPICAL_CREAM | CUTANEOUS | Status: DC | PRN
  Filled 2020-11-07: qty 5

## 2020-11-07 MED ORDER — ONDANSETRON HCL 4 MG/2ML IJ SOLN
4.0000 mg | Freq: Four times a day (QID) | INTRAMUSCULAR | Status: DC | PRN
  Administered 2020-11-07 – 2020-11-11 (×3): 4 mg via INTRAVENOUS
  Filled 2020-11-07 (×3): qty 2

## 2020-11-07 MED ORDER — MIDAZOLAM HCL 2 MG/2ML IJ SOLN
INTRAMUSCULAR | Status: AC
  Filled 2020-11-07: qty 2

## 2020-11-07 MED ORDER — DEXTROSE 50 % IV SOLN
25.0000 g | INTRAVENOUS | Status: AC
  Administered 2020-11-07: 25 g via INTRAVENOUS
  Filled 2020-11-07 (×2): qty 50

## 2020-11-07 MED ORDER — PENTAFLUOROPROP-TETRAFLUOROETH EX AERO
1.0000 "application " | INHALATION_SPRAY | CUTANEOUS | Status: DC | PRN
  Filled 2020-11-07: qty 30

## 2020-11-07 MED ORDER — HYDROMORPHONE HCL 1 MG/ML IJ SOLN
1.0000 mg | Freq: Once | INTRAMUSCULAR | Status: AC | PRN
  Administered 2020-11-07: 1 mg via INTRAVENOUS
  Filled 2020-11-07: qty 1

## 2020-11-07 MED ORDER — HEPARIN SODIUM (PORCINE) 1000 UNIT/ML DIALYSIS: 1000.0000 [IU] | INTRAMUSCULAR | Status: DC | PRN

## 2020-11-07 MED ORDER — CHLORHEXIDINE GLUCONATE CLOTH 2 % EX PADS
6.0000 | MEDICATED_PAD | Freq: Every day | CUTANEOUS | Status: DC
  Administered 2020-11-07 – 2020-11-12 (×5): 6 via TOPICAL

## 2020-11-07 MED ORDER — CEFAZOLIN SODIUM-DEXTROSE 1-4 GM/50ML-% IV SOLN
1.0000 g | Freq: Once | INTRAVENOUS | Status: AC
  Filled 2020-11-07: qty 50

## 2020-11-07 MED ORDER — MIDAZOLAM HCL 2 MG/2ML IJ SOLN
INTRAMUSCULAR | Status: DC | PRN
  Administered 2020-11-07 (×2): 1 mg via INTRAVENOUS

## 2020-11-07 MED ORDER — FENTANYL CITRATE (PF) 100 MCG/2ML IJ SOLN
INTRAMUSCULAR | Status: DC | PRN
  Administered 2020-11-07 (×2): 50 ug via INTRAVENOUS

## 2020-11-07 MED ORDER — FAMOTIDINE 20 MG PO TABS: 40.0000 mg | ORAL_TABLET | Freq: Once | ORAL | Status: DC | PRN

## 2020-11-07 MED ORDER — DIPHENHYDRAMINE HCL 50 MG/ML IJ SOLN: 50.0000 mg | Freq: Once | INTRAMUSCULAR | Status: DC | PRN

## 2020-11-07 SURGICAL SUPPLY — 6 items
CATH PALIN MAXID VT KIT 19CM (CATHETERS) ×2 IMPLANT
DERMABOND ADVANCED (GAUZE/BANDAGES/DRESSINGS) ×1
DERMABOND ADVANCED .7 DNX12 (GAUZE/BANDAGES/DRESSINGS) ×1 IMPLANT
PACK ANGIOGRAPHY (CUSTOM PROCEDURE TRAY) ×2 IMPLANT
SUT MNCRL AB 4-0 PS2 18 (SUTURE) ×2 IMPLANT
SUT PROLENE 0 CT 1 30 (SUTURE) ×2 IMPLANT

## 2020-11-07 NOTE — Care Management Important Message (Signed)
Important Message  Patient Details  Name: Terry Arroyo MRN: [id number] Date of Birth: 1961/02/03   Medicare Important Message Given:  Yes     Dannette Barbara 11/07/2020, 12:28 PM

## 2020-11-07 NOTE — Progress Notes (Signed)
[facility]  ROUNDING NOTE   Subjective:  Patient underwent  vascular procedure this morning, with placement of Right IJ permcath. She is getting her first dialysis session today.   HEMODIALYSIS FLOWSHEET:  Blood Flow Rate (mL/min): 200 mL/min Arterial Pressure (mmHg): -50 mmHg Venous Pressure (mmHg): 70 mmHg Transmembrane Pressure (mmHg): 40 mmHg Ultrafiltration Rate (mL/min): 250 mL/min Dialysate Flow Rate (mL/min): 300 ml/min Conductivity: Machine : 14 Conductivity: Machine : 14 Dialysis Fluid Bolus: Normal Saline Bolus Amount (mL): 250 mL   Objective:  Vital signs in last 24 hours:  Temp:  [97.9 F (36.6 C)-98.9 F (37.2 C)] 98.1 F (36.7 C) (11/19 1016) Pulse Rate:  [60-92] 66 (11/19 1200) Resp:  [14-20] 19 (11/19 1200) BP: (131-161)/(71-89) 158/82 (11/19 1200) SpO2:  [95 %-100 %] 98 % (11/19 1200) Weight:  [83.9 kg] 83.9 kg (11/19 0831)  Weight change: -0.136 kg Filed Weights   11/06/20 0100 11/07/20 0355 11/07/20 0831  Weight: 84 kg 83.9 kg 83.9 kg    Intake/Output: I/O last 3 completed shifts: In: 1177.5 [P.O.:840; I.V.:237.5; IV Piggyback:100] Out: 1000 [Urine:1000]   Intake/Output this shift:  No intake/output data recorded.  Physical Exam: General:  Lying in bed, in no acute distress  Head:  Oral mucous membranes moist  Eyes: Anicteric  Lungs:   Lungs clear, diminished at the bases,respiration even,unlabored  Heart:  S1S2, no rubs or gallops  Abdomen:   Soft, nontender, nondistended  Extremities:  2+ peripheral edema.  Neurologic:  Awake,alert,oriented  Skin:  No acute lesions or rashes  Rt IJ Permcath  Basic Metabolic Panel: Recent Labs  Lab 11/03/20 0541 11/03/20 0541 11/04/20 0543 11/04/20 0543 11/05/20 0548 11/06/20 0533 11/07/20 0538  NA 141  --  140  --  143 143 140  K 3.3*  --  3.7  --  4.0 4.8 4.7  CL 105  --  105  --  106 104 101  CO2 25  --  27  --  26 24 25   GLUCOSE 345*  --  211*  --  170* 193* 212*  BUN 55*   --  58*  --  70* 71* 74*  CREATININE 3.54*  --  3.79*  --  3.90* 4.32* 4.71*  CALCIUM 8.3*   < > 8.1*   < > 8.8* 9.1 9.0  MG 2.2  --  2.0  --  1.9 1.8 1.7   < > = values in this interval not displayed.    Liver Function Tests: Recent Labs  Lab 11/07/20 0538  AST 11*  ALT 12  ALKPHOS 58  BILITOT 0.7  PROT 6.4*  ALBUMIN 3.6   No results for input(s): LIPASE, AMYLASE in the last 168 hours. No results for input(s): AMMONIA in the last 168 hours.  CBC: Recent Labs  Lab 11/01/20 1520 11/07/20 0538  WBC 11.7* 15.4*  NEUTROABS  --  11.7*  HGB 9.1* 7.9*  HCT 28.0* 24.8*  MCV 89.5 91.9  PLT 469* 351    Cardiac Enzymes: No results for input(s): CKTOTAL, CKMB, CKMBINDEX, TROPONINI in the last 168 hours.  BNP: Invalid input(s): POCBNP  CBG: Recent Labs  Lab 11/06/20 0810 11/06/20 1139 11/06/20 1715 11/06/20 2111 11/07/20 0740  GLUCAP 138* 140* 199* 220* 172*    Microbiology: Results for orders placed or performed during the hospital encounter of 11/01/20  Respiratory Panel by RT PCR (Flu A&B, Covid) - Nasopharyngeal Swab     Status: None   Collection Time: 11/01/20  9:18 PM  Specimen: Nasopharyngeal Swab  Result Value Ref Range Status   SARS Coronavirus 2 by RT PCR NEGATIVE NEGATIVE Final    Comment: (NOTE) SARS-CoV-2 target nucleic acids are NOT DETECTED.  The SARS-CoV-2 RNA is generally detectable in upper respiratoy specimens during the acute phase of infection. The lowest concentration of SARS-CoV-2 viral copies this assay can detect is 131 copies/mL. A negative result does not preclude SARS-Cov-2 infection and should not be used as the sole basis for treatment or other patient management decisions. A negative result may occur with  improper specimen collection/handling, submission of specimen other than nasopharyngeal swab, presence of viral mutation(s) within the areas targeted by this assay, and inadequate number of viral copies (<131 copies/mL). A  negative result must be combined with clinical observations, patient history, and epidemiological information. The expected result is Negative.  Fact Sheet for Patients:  PinkCheek.be  Fact Sheet for Healthcare Providers:  GravelBags.it  This test is no t yet approved or cleared by the [place] FDA and  has been authorized for detection and/or diagnosis of SARS-CoV-2 by FDA under an Emergency Use Authorization (EUA). This EUA will remain  in effect (meaning this test can be used) for the duration of the COVID-19 declaration under Section 564(b)(1) of the Act, 21 U.S.C. section 360bbb-3(b)(1), unless the authorization is terminated or revoked sooner.     Influenza A by PCR NEGATIVE NEGATIVE Final   Influenza B by PCR NEGATIVE NEGATIVE Final    Comment: (NOTE) The Xpert Xpress SARS-CoV-2/FLU/RSV assay is intended as an aid in  the diagnosis of influenza from Nasopharyngeal swab specimens and  should not be used as a sole basis for treatment. Nasal washings and  aspirates are unacceptable for Xpert Xpress SARS-CoV-2/FLU/RSV  testing.  Fact Sheet for Patients: PinkCheek.be  Fact Sheet for Healthcare Providers: GravelBags.it  This test is not yet approved or cleared by the [place] FDA and  has been authorized for detection and/or diagnosis of SARS-CoV-2 by  FDA under an Emergency Use Authorization (EUA). This EUA will remain  in effect (meaning this test can be used) for the duration of the  Covid-19 declaration under Section 564(b)(1) of the Act, 21  U.S.C. section 360bbb-3(b)(1), unless the authorization is  terminated or revoked. Performed at [facility], [address] [place], [place] [address]   MRSA PCR Screening     Status: None   Collection Time: 11/07/20  1:30 AM   Specimen: Nasal Mucosa; Nasopharyngeal  Result Value Ref Range  Status   MRSA by PCR NEGATIVE NEGATIVE Final    Comment:        The GeneXpert MRSA Assay (FDA approved for NASAL specimens only), is one component of a comprehensive MRSA colonization surveillance program. It is not intended to diagnose MRSA infection nor to guide or monitor treatment for MRSA infections. Performed at [facility], [address] [place], [place] [address]     Coagulation Studies: No results for input(s): LABPROT, INR in the last 72 hours.  Urinalysis: No results for input(s): COLORURINE, LABSPEC, PHURINE, GLUCOSEU, HGBUR, BILIRUBINUR, KETONESUR, PROTEINUR, UROBILINOGEN, NITRITE, LEUKOCYTESUR in the last 72 hours.  Invalid input(s): APPERANCEUR    Imaging: PERIPHERAL VASCULAR CATHETERIZATION  Result Date: 11/07/2020 See op note    Medications:   . sodium chloride 250 mL (11/07/20 0522)  . sodium chloride    . sodium chloride    . albumin human 25 g (11/07/20 0640)  . furosemide (LASIX) 200 mg in dextrose 5% 100 mL (  2mg /mL) infusion 6 mg/hr (11/07/20 0521)   . amLODipine  10 mg Oral Daily  . aspirin EC  81 mg Oral Daily  . atorvastatin  80 mg Oral q1800  . carvedilol  25 mg Oral BID WC  . Chlorhexidine Gluconate Cloth  6 each Topical Q0600  . famotidine  10 mg Oral Daily  . fenofibrate  160 mg Oral q1800  . fentaNYL      . heparin  5,000 Units Subcutaneous Q8H  . insulin aspart  0-20 Units Subcutaneous TID WC  . insulin aspart  0-5 Units Subcutaneous QHS  . insulin aspart  4 Units Subcutaneous TID WC  . insulin glargine  30 Units Subcutaneous Daily  . isosorbide mononitrate  30 mg Oral Daily  . midazolam      . mirtazapine  30 mg Oral QHS  . mometasone-formoterol  2 puff Inhalation BID  . polyethylene glycol  17 g Oral Daily  . prednisoLONE acetate  1 drop Right Eye QID  . predniSONE  20 mg Oral Q breakfast  . senna-docusate  2 tablet Oral BID  . sodium chloride flush  3 mL Intravenous Q12H  . topiramate  25 mg Oral Daily  .  cyanocobalamin  1,000 mcg Oral Daily   sodium chloride, sodium chloride, sodium chloride, albuterol, alteplase, bisacodyl, cyclobenzaprine, heparin, HYDROmorphone (DILAUDID) injection, ipratropium-albuterol, lidocaine (PF), lidocaine-prilocaine, ondansetron (ZOFRAN) IV, pentafluoroprop-tetrafluoroeth, sodium chloride flush  Assessment/ Plan:  59 y.o. female with past medical history of congestive heart failure, diabetes mellitus type 2 with chronic kidney disease, hypertension, hyperlipidemia, chronic kidney disease stage IV with diabetic nephropathy and interstitial nephritis, history of brain tumor, left eye blindness who presented with increasing shortness of breath and lower extremity edema.   # Chronic kidney disease stage IV #proteinuria #mild interstitial nephritis.  #Bilateral lower extremity edema  Lab Results  Component Value Date   CREATININE 4.71 (H) 11/07/2020   CREATININE 4.32 (H) 11/06/2020   CREATININE 3.90 (H) 11/05/2020  Patient receiving her first dialysis treatment today,short session New dialysis access placed by vascular team today, Rt IJ Permcath Patient tolerating treatment well Will plan for her second dialysis  treatment session tomorrow Will discontinue IV Furosemide infusion  #Anemia of chronic kidney disease.   Lab Results  Component Value Date   HGB 7.9 (L) 11/07/2020  Will  Consider Epogen with dialysis   # Diabetes mellitus type 2 with chronic kidney disease Lab Results  Component Value Date   HGBA1C 8.6 (H) 10/11/2020  Blood glucose levels getting controlled with current antihyperglycemic regimen   #Metabolic Acidosis  Resolved  CO2 25 today    LOS: 6 Terry Arroyo 11/19/202112:47 PM

## 2020-11-07 NOTE — Progress Notes (Signed)
Mobility Specialist - Progress Note   11/07/20 1600  Mobility  Range of Motion/Exercises Right leg;Left leg (ankle pumps, hip isometrics, straight leg raises)  Level of Assistance Standby assist, set-up cues, supervision of patient - no hands on  Assistive Device Front wheel walker  Distance Ambulated (ft) 0 ft  Mobility Response Tolerated well  Mobility performed by Mobility specialist  $Mobility charge 1 Mobility    Pre-mobility: 65 HR, 96% SpO2 Post-mobility: 63 HR, 94% SpO2   Pt was lying in bed upon arrival utilizing room air. Pt agreed to session. Pt was teary-eyed c/o pain in site of new cath placement "7/10". Nurse was notified. Pt performed supine exercises: ankle pumps, hip isometrics, and straight leg raises. Nurse entered to administer medication. Pt stated, "it hurts from my ear down" pointing to tunneled HD cath. Further mobility deferred d/t pt feeling immediately drowsy from medication. Overall, pt tolerated session well. Pt was left in bed with all needs in reach and alarm set.    Kathee Delton Mobility Specialist 11/07/20, 4:43 PM

## 2020-11-07 NOTE — Progress Notes (Signed)
Patient's remeron and sennekot not given becase patient is unable to tolerate PO due to nausea and episode of vomiting. NP aware. Instructed to hold food or drink at this time.

## 2020-11-07 NOTE — Progress Notes (Signed)
PROGRESS NOTE    Terry Arroyo  IWP:[id number] DOB: 12-26-1960 DOA: 11/01/2020 PCP: Kirk Ruths, MD   Chief complaint shortness of breath. Brief Narrative:  Patient is a 59 year old female with history oftype 2 diabetes, essential hypertension, history of CVA, coronary disease, chronic systolic congestive heart failure with ejection fraction 30-35%(03/21).She came to the hospital complaining increase short of breath, chest pain, and leg edema. She also has worsening renal function. She has been seen by nephrology and cardiology, she is started on IV Lasix drip. Patient renal function is getting worse, short of breath is not getting better.  Permacath is placed on 11/19 and patient dialyzed.   Assessment & Plan:   Principal Problem:   Chest pain Active Problems:   DM2 (diabetes mellitus, type 2) (HCC)   Acute on chronic systolic CHF (congestive heart failure) (HCC)   Leg edema   COPD exacerbation (HCC)   SOB (shortness of breath)   Nephrotic syndrome  #1. Acute on chronic systolic congestive heart failure. Patient condition was not improving after giving Lasix drip.  As result, she has received hemodialysis.  2.  Acute kidney failure on chronic kidney stage IV/V, nephrotic syndrome. Renal function got worse after diuretics.  Permacath was placed today, patient has received the first session of dialysis.  Currently patient doing well.  3.  Chest pain. Likely due to heart failure and shortness of breath.  4.  COPD. No bronchospasm.  5.  Essential hypertension. Continue home medicines.  6.  Type 2 diabetes uncontrolled with hyperglycemia. Continue current regimen.  Insulin has been decreasing dose yesterday.  7.  Anemia of chronic kidney disease. No evidence of acute bleed.  Check iron B12 level.  8.  Reactive thrombocytosis.     DVT prophylaxis: Heparin Code Status: Full Family Communication: None Disposition Plan:  .   Status is:  Inpatient  Remains inpatient appropriate because:Inpatient level of care appropriate due to severity of illness   Dispo: The patient is from: Home              Anticipated d/c is to: Home              Anticipated d/c date is: > 3 days              Patient currently is not medically stable to d/c.        I/O last 3 completed shifts: In: 1177.5 [P.O.:840; I.V.:237.5; IV Piggyback:100] Out: 1000 [Urine:1000] No intake/output data recorded.     Consultants:   Nephrology  Procedures: None  Antimicrobials: None  Subjective: Seen patient after dialysis.  Patient has some sleepiness, but shortness of breath seem to be improving.  No cough. No abdominal pain nausea vomiting. No fever chills. No headache or dizziness.  Objective: Vitals:   11/07/20 1200 11/07/20 1215 11/07/20 1230 11/07/20 1256  BP: (!) 158/82 (!) 159/84 (!) 161/88 (!) 165/79  Pulse: 66 66 69 65  Resp: 19 19 19 18   Temp:      TempSrc:      SpO2: 98%   99%  Weight:      Height:        Intake/Output Summary (Last 24 hours) at 11/07/2020 1343 Last data filed at 11/07/2020 1230 Gross per 24 hour  Intake 1177.47 ml  Output 1000 ml  Net 177.47 ml   Filed Weights   11/06/20 0100 11/07/20 0355 11/07/20 0831  Weight: 84 kg 83.9 kg 83.9 kg    Examination:  General exam: Appears  calm and comfortable  Respiratory system: Clear to auscultation. Respiratory effort normal. Cardiovascular system: S1 & S2 heard, RRR. No JVD, murmurs, rubs, gallops or clicks. No pedal edema. Gastrointestinal system: Abdomen is nondistended, soft and nontender. No organomegaly or masses felt. Normal bowel sounds heard. Central nervous system: drowsy and oriented x2. No focal neurological deficits. Extremities: Symmetric 5 x 5 power. Skin: No rashes, lesions or ulcers Psychiatry:  Mood & affect appropriate.     Data Reviewed: I have personally reviewed following labs and imaging studies  CBC: Recent Labs  Lab  11/01/20 1520 11/07/20 0538  WBC 11.7* 15.4*  NEUTROABS  --  11.7*  HGB 9.1* 7.9*  HCT 28.0* 24.8*  MCV 89.5 91.9  PLT 469* 161   Basic Metabolic Panel: Recent Labs  Lab 11/03/20 0541 11/04/20 0543 11/05/20 0548 11/06/20 0533 11/07/20 0538  NA 141 140 143 143 140  K 3.3* 3.7 4.0 4.8 4.7  CL 105 105 106 104 101  CO2 25 27 26 24 25   GLUCOSE 345* 211* 170* 193* 212*  BUN 55* 58* 70* 71* 74*  CREATININE 3.54* 3.79* 3.90* 4.32* 4.71*  CALCIUM 8.3* 8.1* 8.8* 9.1 9.0  MG 2.2 2.0 1.9 1.8 1.7   GFR: Estimated Creatinine Clearance: 14.2 mL/min (A) (by C-G formula based on SCr of 4.71 mg/dL (H)). Liver Function Tests: Recent Labs  Lab 11/07/20 0538  AST 11*  ALT 12  ALKPHOS 58  BILITOT 0.7  PROT 6.4*  ALBUMIN 3.6   No results for input(s): LIPASE, AMYLASE in the last 168 hours. No results for input(s): AMMONIA in the last 168 hours. Coagulation Profile: No results for input(s): INR, PROTIME in the last 168 hours. Cardiac Enzymes: No results for input(s): CKTOTAL, CKMB, CKMBINDEX, TROPONINI in the last 168 hours. BNP (last 3 results) No results for input(s): PROBNP in the last 8760 hours. HbA1C: No results for input(s): HGBA1C in the last 72 hours. CBG: Recent Labs  Lab 11/06/20 1139 11/06/20 1715 11/06/20 2111 11/07/20 0740 11/07/20 1315  GLUCAP 140* 199* 220* 172* 76   Lipid Profile: No results for input(s): CHOL, HDL, LDLCALC, TRIG, CHOLHDL, LDLDIRECT in the last 72 hours. Thyroid Function Tests: No results for input(s): TSH, T4TOTAL, FREET4, T3FREE, THYROIDAB in the last 72 hours. Anemia Panel: No results for input(s): VITAMINB12, FOLATE, FERRITIN, TIBC, IRON, RETICCTPCT in the last 72 hours. Sepsis Labs: No results for input(s): PROCALCITON, LATICACIDVEN in the last 168 hours.  Recent Results (from the past 240 hour(s))  Respiratory Panel by RT PCR (Flu A&B, Covid) - Nasopharyngeal Swab     Status: None   Collection Time: 11/01/20  9:18 PM   Specimen:  Nasopharyngeal Swab  Result Value Ref Range Status   SARS Coronavirus 2 by RT PCR NEGATIVE NEGATIVE Final    Comment: (NOTE) SARS-CoV-2 target nucleic acids are NOT DETECTED.  The SARS-CoV-2 RNA is generally detectable in upper respiratoy specimens during the acute phase of infection. The lowest concentration of SARS-CoV-2 viral copies this assay can detect is 131 copies/mL. A negative result does not preclude SARS-Cov-2 infection and should not be used as the sole basis for treatment or other patient management decisions. A negative result may occur with  improper specimen collection/handling, submission of specimen other than nasopharyngeal swab, presence of viral mutation(s) within the areas targeted by this assay, and inadequate number of viral copies (<131 copies/mL). A negative result must be combined with clinical observations, patient history, and epidemiological information. The expected result is Negative.  Fact  Sheet for Patients:  PinkCheek.be  Fact Sheet for Healthcare Providers:  GravelBags.it  This test is no t yet approved or cleared by the [place] FDA and  has been authorized for detection and/or diagnosis of SARS-CoV-2 by FDA under an Emergency Use Authorization (EUA). This EUA will remain  in effect (meaning this test can be used) for the duration of the COVID-19 declaration under Section 564(b)(1) of the Act, 21 U.S.C. section 360bbb-3(b)(1), unless the authorization is terminated or revoked sooner.     Influenza A by PCR NEGATIVE NEGATIVE Final   Influenza B by PCR NEGATIVE NEGATIVE Final    Comment: (NOTE) The Xpert Xpress SARS-CoV-2/FLU/RSV assay is intended as an aid in  the diagnosis of influenza from Nasopharyngeal swab specimens and  should not be used as a sole basis for treatment. Nasal washings and  aspirates are unacceptable for Xpert Xpress SARS-CoV-2/FLU/RSV  testing.  Fact Sheet  for Patients: PinkCheek.be  Fact Sheet for Healthcare Providers: GravelBags.it  This test is not yet approved or cleared by the [place] FDA and  has been authorized for detection and/or diagnosis of SARS-CoV-2 by  FDA under an Emergency Use Authorization (EUA). This EUA will remain  in effect (meaning this test can be used) for the duration of the  Covid-19 declaration under Section 564(b)(1) of the Act, 21  U.S.C. section 360bbb-3(b)(1), unless the authorization is  terminated or revoked. Performed at [facility], [address] [place], [place] [address]   MRSA PCR Screening     Status: None   Collection Time: 11/07/20  1:30 AM   Specimen: Nasal Mucosa; Nasopharyngeal  Result Value Ref Range Status   MRSA by PCR NEGATIVE NEGATIVE Final    Comment:        The GeneXpert MRSA Assay (FDA approved for NASAL specimens only), is one component of a comprehensive MRSA colonization surveillance program. It is not intended to diagnose MRSA infection nor to guide or monitor treatment for MRSA infections. Performed at [facility], [address] [place], [place] [address]          Radiology Studies: PERIPHERAL VASCULAR CATHETERIZATION  Result Date: 11/07/2020 See op note       Scheduled Meds: . amLODipine  10 mg Oral Daily  . aspirin EC  81 mg Oral Daily  . atorvastatin  80 mg Oral q1800  . carvedilol  25 mg Oral BID WC  . Chlorhexidine Gluconate Cloth  6 each Topical Q0600  . famotidine  10 mg Oral Daily  . fenofibrate  160 mg Oral q1800  . fentaNYL      . heparin  5,000 Units Subcutaneous Q8H  . insulin aspart  0-20 Units Subcutaneous TID WC  . insulin aspart  0-5 Units Subcutaneous QHS  . insulin aspart  4 Units Subcutaneous TID WC  . insulin glargine  30 Units Subcutaneous Daily  . isosorbide mononitrate  30 mg Oral Daily  . midazolam      . mirtazapine  30 mg Oral QHS  .  mometasone-formoterol  2 puff Inhalation BID  . polyethylene glycol  17 g Oral Daily  . prednisoLONE acetate  1 drop Right Eye QID  . predniSONE  20 mg Oral Q breakfast  . senna-docusate  2 tablet Oral BID  . sodium chloride flush  3 mL Intravenous Q12H  . topiramate  25 mg Oral Daily  . cyanocobalamin  1,000 mcg Oral Daily   Continuous Infusions: . sodium chloride 250 mL (11/07/20  0522)  . albumin human 25 g (11/07/20 0640)     LOS: 6 days    Time spent: 25 minutes    Sharen Hones, MD [facility]   To contact the attending provider between 7A-7P or the covering provider during after hours 7P-7A, please log into the web site [email or web address] and access using universal [place] password for that web site. If you do not have the password, please call the hospital operator.  11/07/2020, 1:43 PM

## 2020-11-07 NOTE — Progress Notes (Signed)
Pt awakened from sleep during rounds and while nurse was checking vitals. Patient c/o numbness and "pins and needles in toes". Neuro assessment performed. See flowsheet.  Patient began crying. She explains she is nervous for today's procedure. Patient will have perm cath placed and begin HD. Chaplain paged to come talk with patient. Patient got up to use bedside scale. Patient c/o dizziness upon sitting back in bed. Patient states pins and needles feeling has subsided. No c/o pain. Nurse at bedside with patient. No signs of acute distress. Vitals stable.Respirations even. Patient is nervous and teary eyed at this time. Will wait at bedside for chaplain arrival.

## 2020-11-07 NOTE — Progress Notes (Signed)
Hypoglycemic Event  CBG: 35 at 2032  Treatment: D50 25 g IV. See MAR  Symptoms: Shaky, sweating, decreased LOC  Follow-up CBG: Time:2104 CBG Result:187  Possible Reasons for Event: Patient was NPO for procedure and following procedure, patient experienced episodes of n/v  Comments/MD notified: NP notified of hypoglycemic event. Nurse instructed to hold food or drink due to patient's nausea and recheck patient BS in 1 hour.   2105: Patient alert and oriented. Able to recall that she felt shaky and sweaty. Says she does not recall "going out". Patient requesting sandwich tray. Will hold food until further instruction from NP.     Terry Arroyo

## 2020-11-07 NOTE — Progress Notes (Signed)
Patient alert in bed. States no complaints of nausea at this time. No signs of acute distress. Patient requesting to eat. Patient is okay to have snack and drink as per NP. Patient given sandwich tray and cranberry juice.

## 2020-11-07 NOTE — Op Note (Signed)
OPERATIVE NOTE    PRE-OPERATIVE DIAGNOSIS: 1. ESRD   POST-OPERATIVE DIAGNOSIS: same as above  PROCEDURE: 1. Ultrasound guidance for vascular access to the right internal jugular vein 2. Fluoroscopic guidance for placement of catheter 3. Placement of a 19 cm tip to cuff tunneled hemodialysis catheter via the right internal jugular vein  SURGEON: Leotis Pain, MD  ANESTHESIA:  Local with Moderate conscious sedation for approximately 12 minutes using 2 mg of Versed and 100 mcg of Fentanyl  ESTIMATED BLOOD LOSS: 25 cc  FLUORO TIME: less than one minute  CONTRAST: none  FINDING(S): 1.  Patent right internal jugular vein  SPECIMEN(S):  None  INDICATIONS:   Terry Arroyo is a 58 y.o.female who presents with renal failure and volume overload and has now progressed to need dialysis.  The patient needs long term dialysis access for their ESRD, and a Permcath is necessary.  Risks and benefits are discussed and informed consent is obtained.    DESCRIPTION: After obtaining full informed written consent, the patient was brought back to the vascular suited. The patient's right neck and chest were sterilely prepped and draped in a sterile surgical field was created. Moderate conscious sedation was administered during a face to face encounter with the patient throughout the procedure with my supervision of the RN administering medicines and monitoring the patient's vital signs, pulse oximetry, telemetry and mental status throughout from the start of the procedure until the patient was taken to the recovery room.  The right internal jugular vein was visualized with ultrasound and found to be patent. It was then accessed under direct ultrasound guidance and a permanent image was recorded. A wire was placed. After skin nick and dilatation, the peel-away sheath was placed over the wire. I then turned my attention to an area under the clavicle. Approximately 1-2 fingerbreadths below the clavicle a small  counterincision was created and tunneled from the subclavicular incision to the access site. Using fluoroscopic guidance, a 19 centimeter tip to cuff tunneled hemodialysis catheter was selected, and tunneled from the subclavicular incision to the access site. It was then placed through the peel-away sheath and the peel-away sheath was removed. Using fluoroscopic guidance the catheter tips were parked in the right atrium. The appropriate distal connectors were placed. It withdrew blood well and flushed easily with heparinized saline and a concentrated heparin solution was then placed. It was secured to the chest wall with 2 Prolene sutures. The access incision was closed single 4-0 Monocryl. A 4-0 Monocryl pursestring suture was placed around the exit site. Sterile dressings were placed. The patient tolerated the procedure well and was taken to the recovery room in stable condition.  COMPLICATIONS: None  CONDITION: Stable  Leotis Pain, MD 11/07/2020 9:02 AM   This note was created with Dragon Medical transcription system. Any errors in dictation are purely unintentional.

## 2020-11-07 NOTE — Progress Notes (Signed)
PT Cancellation Note  Patient Details Name: Terry Arroyo MRN: [id number] DOB: January 08, 1961   Cancelled Treatment:    Reason Eval/Treat Not Completed: Patient at procedure or test/unavailable   Pt at procedure and dialysis this am.  Will continue as appropriate.   Chesley Noon 11/07/2020, 12:33 PM

## 2020-11-07 NOTE — Consult Note (Signed)
[facility] Vascular Consult Note  MRN : [id number]  Terry Arroyo is a 59 y.o. (1961/03/25) female who presents with chief complaint of  Chief Complaint  Patient presents with  . Shortness of Breath  . Back Pain  .  History of Present Illness: Patient presents with shortness of breath and worsening of her renal function.  We are consulted by Dr. Holley Raring in nephrology for placement of a PermCath.  She needs dialysis at this time.  She had previously been stage IV chronic kidney disease, but her creatinine clearance is down to 10 and she is now progressed to end-stage renal disease.  She has significant volume overload.  She has significant right arm swelling.  She is short of breath at rest.  Current Facility-Administered Medications  Medication Dose Route Frequency Provider Last Rate Last Admin  . 0.9 %  sodium chloride infusion  250 mL Intravenous PRN Para Skeans, MD 7 mL/hr at 11/07/20 0522 250 mL at 11/07/20 0522  . 0.9 %  sodium chloride infusion   Intravenous Continuous Eulogio Ditch E, NP      . albumin human 25 % solution 25 g  25 g Intravenous Q8H Gwynne Edinger, MD 60 mL/hr at 11/07/20 0640 25 g at 11/07/20 0640  . albuterol (VENTOLIN HFA) 108 (90 Base) MCG/ACT inhaler 1-2 puff  1-2 puff Inhalation Q6H PRN Gwynne Edinger, MD   2 puff at 11/06/20 0419  . amLODipine (NORVASC) tablet 10 mg  10 mg Oral Daily Para Skeans, MD   10 mg at 11/06/20 0923  . aspirin EC tablet 81 mg  81 mg Oral Daily Para Skeans, MD   81 mg at 11/06/20 0924  . atorvastatin (LIPITOR) tablet 80 mg  80 mg Oral q1800 Para Skeans, MD   80 mg at 11/06/20 1710  . bisacodyl (DULCOLAX) EC tablet 5 mg  5 mg Oral Daily PRN Lang Snow, NP      . carvedilol (COREG) tablet 25 mg  25 mg Oral BID WC Para Skeans, MD   25 mg at 11/06/20 1710  . ceFAZolin (ANCEF) IVPB 1 g/50 mL premix  1 g Intravenous Once Eulogio Ditch E, NP      . cyclobenzaprine (FLEXERIL) tablet 5 mg   5 mg Oral BID PRN Lang Snow, NP   5 mg at 11/03/20 0839  . diphenhydrAMINE (BENADRYL) injection 50 mg  50 mg Intravenous Once PRN Kris Hartmann, NP      . famotidine (PEPCID) tablet 10 mg  10 mg Oral Daily Para Skeans, MD   10 mg at 11/06/20 0924  . famotidine (PEPCID) tablet 40 mg  40 mg Oral Once PRN Kris Hartmann, NP      . fenofibrate tablet 160 mg  160 mg Oral q1800 Florina Ou V, MD   160 mg at 11/06/20 1710  . furosemide (LASIX) 200 mg in dextrose 5 % 100 mL (2 mg/mL) infusion  6 mg/hr Intravenous Continuous Lateef, Munsoor, MD 3 mL/hr at 11/07/20 0521 6 mg/hr at 11/07/20 0521  . heparin injection 5,000 Units  5,000 Units Subcutaneous Q8H Para Skeans, MD   5,000 Units at 11/07/20 0728  . HYDROmorphone (DILAUDID) injection 1 mg  1 mg Intravenous Once PRN Eulogio Ditch E, NP      . insulin aspart (novoLOG) injection 0-20 Units  0-20 Units Subcutaneous TID WC Wouk, Ailene Rud, MD   4 Units at 11/06/20 1744  .  insulin aspart (novoLOG) injection 0-5 Units  0-5 Units Subcutaneous QHS Gwynne Edinger, MD   2 Units at 11/06/20 2133  . insulin aspart (novoLOG) injection 4 Units  4 Units Subcutaneous TID WC Sharen Hones, MD   4 Units at 11/06/20 1744  . insulin glargine (LANTUS) injection 30 Units  30 Units Subcutaneous Daily Sharen Hones, MD      . ipratropium-albuterol (DUONEB) 0.5-2.5 (3) MG/3ML nebulizer solution 3 mL  3 mL Nebulization Q4H PRN Sharen Hones, MD   3 mL at 11/05/20 0827  . isosorbide mononitrate (IMDUR) 24 hr tablet 30 mg  30 mg Oral Daily Para Skeans, MD   30 mg at 11/06/20 0923  . methylPREDNISolone sodium succinate (SOLU-MEDROL) 125 mg/2 mL injection 125 mg  125 mg Intravenous Once PRN Kris Hartmann, NP      . midazolam (VERSED) 2 MG/ML syrup 8 mg  8 mg Oral Once PRN Kris Hartmann, NP      . mirtazapine (REMERON) tablet 30 mg  30 mg Oral QHS Gwynne Edinger, MD   30 mg at 11/06/20 2132  . mometasone-formoterol (DULERA) 200-5 MCG/ACT inhaler 2  puff  2 puff Inhalation BID Para Skeans, MD   2 puff at 11/06/20 2134  . ondansetron (ZOFRAN) injection 4 mg  4 mg Intravenous Q6H PRN Eulogio Ditch E, NP      . polyethylene glycol (MIRALAX / GLYCOLAX) packet 17 g  17 g Oral Daily Gwynne Edinger, MD   17 g at 11/06/20 0929  . prednisoLONE acetate (PRED FORTE) 1 % ophthalmic suspension 1 drop  1 drop Right Eye QID Gwynne Edinger, MD   1 drop at 11/06/20 2135  . predniSONE (DELTASONE) tablet 20 mg  20 mg Oral Q breakfast Para Skeans, MD   20 mg at 11/06/20 0852  . senna-docusate (Senokot-S) tablet 2 tablet  2 tablet Oral BID Sharen Hones, MD   2 tablet at 11/06/20 2132  . sodium chloride flush (NS) 0.9 % injection 3 mL  3 mL Intravenous Q12H Para Skeans, MD   3 mL at 11/06/20 2205  . sodium chloride flush (NS) 0.9 % injection 3 mL  3 mL Intravenous PRN Para Skeans, MD      . topiramate (TOPAMAX) tablet 25 mg  25 mg Oral Daily Para Skeans, MD   25 mg at 11/06/20 0925  . vitamin B-12 (CYANOCOBALAMIN) tablet 1,000 mcg  1,000 mcg Oral Daily Gwynne Edinger, MD   1,000 mcg at 11/06/20 6967    Past Medical History:  Diagnosis Date  . Blind left eye   . Brain tumor ([place]) 1986  . CHF (congestive heart failure) ([place])   . Diabetes mellitus without complication ([place])   . Hyperlipidemia   . Hypertension   . MI (myocardial infarction) ([place]) 2018  . Stroke [facility])    stated affected left side.    Past Surgical History:  Procedure Laterality Date  . BRAIN SURGERY    . BREAST EXCISIONAL BIOPSY Left yrs ago   benign  . CESAREAN SECTION       Social History   Tobacco Use  . Smoking status: Former Research scientist (life sciences)  . Smokeless tobacco: Never Used  Vaping Use  . Vaping Use: Never used  Substance Use Topics  . Alcohol use: Not Currently  . Drug use: Not Currently     Family History  Problem Relation Age of Onset  . CAD Mother   . CAD  Brother   . Breast cancer Sister 61  no bleeding or clotting disorders  Allergies  Allergen  Reactions  . Hydralazine Shortness Of Breath and Swelling    Body aches  . Shellfish Allergy Anaphylaxis    Shrimp/lobster  Shrimp/lobster    . Kiwi Extract Swelling  . Metformin Other (See Comments)    Other reaction(s): GI Intolerance  . Tape Itching    Use paper tape whenever possible     REVIEW OF SYSTEMS (Negative unless checked)  Constitutional: [] Weight loss  [] Fever  [] Chills Cardiac: [] Chest pain   [] Chest pressure   [] Palpitations   [] Shortness of breath when laying flat   [] Shortness of breath at rest   [] Shortness of breath with exertion. Vascular:  [] Pain in legs with walking   [] Pain in legs at rest   [] Pain in legs when laying flat   [] Claudication   [] Pain in feet when walking  [] Pain in feet at rest  [] Pain in feet when laying flat   [] History of DVT   [x] Phlebitis   [x] Swelling in legs   [] Varicose veins   [] Non-healing ulcers Pulmonary:   [] Uses home oxygen   [] Productive cough   [] Hemoptysis   [] Wheeze  [] COPD   [] Asthma Neurologic:  [] Dizziness  [] Blackouts   [] Seizures   [x] History of stroke   [] History of TIA  [] Aphasia   [] Temporary blindness   [] Dysphagia   [] Weakness or numbness in arms   [] Weakness or numbness in legs Musculoskeletal:  [x] Arthritis   [] Joint swelling   [] Joint pain   [] Low back pain Hematologic:  [] Easy bruising  [] Easy bleeding   [] Hypercoagulable state   [x] Anemic  [] Hepatitis Gastrointestinal:  [] Blood in stool   [] Vomiting blood  [] Gastroesophageal reflux/heartburn   [] Difficulty swallowing. Genitourinary:  [x] Chronic kidney disease   [] Difficult urination  [] Frequent urination  [] Burning with urination   [] Blood in urine Skin:  [] Rashes   [] Ulcers   [] Wounds Psychological:  [] History of anxiety   []  History of major depression.  Physical Examination  Vitals:   11/06/20 1532 11/06/20 2016 11/07/20 0355 11/07/20 0739  BP: (!) 145/76 131/73 (!) 149/78 135/73  Pulse: 66 63 64 63  Resp: 20  (P) 20 18  Temp: 97.9 F (36.6 C) 98.6 F (37  C) 98.9 F (37.2 C)   TempSrc: Oral Oral Oral   SpO2: 100% 96% 97% 99%  Weight:   83.9 kg   Height:       Body mass index is 29.84 kg/m. Gen:  WD/WN, NAD Head: [place]/AT, No temporalis wasting.  Ear/Nose/Throat: Hearing grossly intact, nares w/o erythema or drainage, oropharynx w/o Erythema/Exudate Eyes: Sclera non-icteric, conjunctiva clear Neck: Trachea midline.  No JVD.  Pulmonary:  Good air movement, respirations not labored, equal bilaterally.  Cardiac: RRR, normal S1, S2. Vascular:  Vessel Right Left  Radial Palpable Palpable   Musculoskeletal: right sided weakness, right arm with 2+ edema. BLE with 1-2+ edema. Neurologic: Sensation grossly intact in extremities.  Speech is fluent. Motor exam as listed above. Psychiatric: Judgment and insight appear fair.  The patient is somewhat somnolent. Dermatologic: No rashes or ulcers noted.  No cellulitis or open wounds.      CBC Lab Results  Component Value Date   WBC 15.4 (H) 11/07/2020   HGB 7.9 (L) 11/07/2020   HCT 24.8 (L) 11/07/2020   MCV 91.9 11/07/2020   PLT 351 11/07/2020    BMET    Component Value Date/Time   NA 140 11/07/2020 0538  NA 134 (L) 11/18/2013 0138   K 4.7 11/07/2020 0538   K 3.8 11/18/2013 0138   CL 101 11/07/2020 0538   CL 98 11/18/2013 0138   CO2 25 11/07/2020 0538   CO2 29 11/18/2013 0138   GLUCOSE 212 (H) 11/07/2020 0538   GLUCOSE 348 (H) 11/18/2013 0138   BUN 74 (H) 11/07/2020 0538   BUN 11 11/18/2013 0138   CREATININE 4.71 (H) 11/07/2020 0538   CREATININE 0.87 11/18/2013 0138   CALCIUM 9.0 11/07/2020 0538   CALCIUM 9.4 11/18/2013 0138   GFRNONAA 10 (L) 11/07/2020 0538   GFRNONAA >60 11/18/2013 0138   GFRAA 18 (L) 09/15/2020 0403   GFRAA >60 11/18/2013 0138   Estimated Creatinine Clearance: 14.2 mL/min (A) (by C-G formula based on SCr of 4.71 mg/dL (H)).  COAG Lab Results  Component Value Date   INR 1.0 09/11/2020   INR 0.9 02/26/2020   INR 1.7 11/18/2013    Radiology DG  Chest 2 View  Result Date: 11/01/2020 CLINICAL DATA:  Chest pain and shortness of breath EXAM: CHEST - 2 VIEW COMPARISON:  October 21, 2020 FINDINGS: There is mild bibasilar atelectasis. There is no edema or airspace opacity. Heart is mildly enlarged with pulmonary vascularity normal. Patient is status post internal mammary bypass grafting. No adenopathy. No bone lesions. No pneumothorax. IMPRESSION: Cardiomegaly with pulmonary vascularity normal. Postoperative changes. Mild bibasilar atelectasis. No edema or airspace opacity. Electronically Signed   By: Lowella Grip III M.D.   On: 11/01/2020 15:51   DG Chest 2 View  Result Date: 10/10/2020 CLINICAL DATA:  Dyspnea EXAM: CHEST - 2 VIEW COMPARISON:  02/26/2020 FINDINGS: The lungs are symmetrically well expanded. Tiny right pleural effusion has developed. Multifocal nodular infiltrate has developed within the left lung, likely infectious or inflammatory in nature. Minimal infiltrate may be present at the right lung base. No pneumothorax. Coronary artery bypass grafting has been performed. Cardiac size within normal limits. No acute bone abnormality. IMPRESSION: Interval development of nodular asymmetric pulmonary infiltrate, likely infectious or inflammatory. Electronically Signed   By: Fidela Salisbury MD   On: 10/10/2020 12:23   CT THORACIC SPINE WO CONTRAST  Result Date: 10/24/2020 CLINICAL DATA:  Mid back pain EXAM: CT THORACIC SPINE WITHOUT CONTRAST TECHNIQUE: Multidetector CT images of the thoracic were obtained using the standard protocol without intravenous contrast. COMPARISON:  CT chest 01/22/2020 FINDINGS: Alignment: Normal Vertebrae: Negative for fracture or mass. No evidence of spinal infection. Paraspinal and other soft tissues: Negative for paraspinous No fluid mass collection or edema. Small bilateral pleural effusions. Diffuse mild bilateral airspace disease. Disc levels: No significant disc space narrowing. No disc degeneration or  spurring identified. Negative for spinal stenosis. IMPRESSION: Negative CT thoracic spine Bilateral airspace disease and bilateral small pleural effusions which may represent congestive heart failure based on prior chest x-ray. Electronically Signed   By: Franchot Gallo M.D.   On: 10/24/2020 15:14   CT LUMBAR SPINE WO CONTRAST  Result Date: 10/24/2020 CLINICAL DATA:  Low back pain EXAM: CT LUMBAR SPINE WITHOUT CONTRAST TECHNIQUE: Multidetector CT imaging of the lumbar spine was performed without intravenous contrast administration. Multiplanar CT image reconstructions were also generated. COMPARISON:  CT abdomen pelvis 09/12/2020 FINDINGS: Segmentation: Normal Alignment: Normal Vertebrae: Negative for fracture or mass. No evidence of spinal infection. Paraspinal and other soft tissues: Atherosclerotic abdominal aorta without aneurysm. Lobular contours to the left renal cortex unchanged. Mild left hydronephrosis unchanged. No obstructing calculus. No paraspinous soft tissue edema or fluid collection. Disc  levels: L1-2: Negative L2-3: Mild disc and facet degeneration. Negative for disc protrusion or stenosis. L3-4: Mild disc and mild facet degeneration. Negative for disc protrusion or stenosis L4-5: Mild disc bulging and mild facet degeneration. Negative for disc protrusion or stenosis. L5-S1: Mild disc degeneration. Negative for disc protrusion or stenosis. IMPRESSION: Mild lumbar degenerative change.  No cause for acute lumbar pain. Electronically Signed   By: Franchot Gallo M.D.   On: 10/24/2020 15:23   DG Chest Portable 1 View  Result Date: 10/21/2020 CLINICAL DATA:  Shortness of breath for 1 week. EXAM: PORTABLE CHEST 1 VIEW COMPARISON:  10/10/2020 FINDINGS: Post median sternotomy and CABG mild cardiomegaly. Progressive bilateral pleural effusions, now moderate in degree. Fluid in the right minor fissure. Mild pulmonary edema. The previous nodular densities in the left greater than right lung are not  definitively seen and may be partially obscured on the current exam by pulmonary edema. No pneumothorax. No acute osseous abnormalities are seen. IMPRESSION: Mild cardiomegaly with pulmonary edema and bilateral pleural effusions, consistent with CHF. Electronically Signed   By: Keith Rake M.D.   On: 10/21/2020 19:35   DG Abd 2 Views  Result Date: 11/01/2020 CLINICAL DATA:  Constipation EXAM: ABDOMEN - 2 VIEW COMPARISON:  September 12, 2020 FINDINGS: Air and stool-filled nondilated loops of bowel. Moderate colonic stool burden predominately in the LEFT colon. Surgical clips project over the heart. Status post median sternotomy. Small bilateral pleural effusions. Vascular calcifications. Chondrocalcinosis of the pubic symphysis. IMPRESSION: Nonobstructive bowel gas pattern. Moderate colonic stool burden, predominately in the LEFT colon. Electronically Signed   By: Valentino Saxon MD   On: 11/01/2020 17:58      Assessment/Plan 1.  Renal failure worsening and now felt to be end-stage renal disease.  PermCath will be placed today.  Patient needs dialysis for shortness of breath and swelling.  Risks and benefits were discussed with the patient and she is agreeable to proceed. 2.  Congestive heart failure.  Worsened by worsening renal function.  Volume overload persists. 3.  Diabetes.  Likely an underlying cause of her renal insufficiency and blood glucose control important in reducing the progression of atherosclerotic disease. Also, involved in wound healing. On appropriate medications. 4.  Hypertension.  Likely an underlying cause of her renal insufficiency and made worse with her volume overload. blood pressure control important in reducing the progression of atherosclerotic disease. On appropriate oral medications.    Leotis Pain, MD  11/07/2020 8:21 AM    This note was created with Dragon medical transcription system.  Any error is purely unintentional

## 2020-11-08 DIAGNOSIS — N183 Chronic kidney disease, stage 3 unspecified: Secondary | ICD-10-CM | POA: Diagnosis not present

## 2020-11-08 DIAGNOSIS — D75838 Other thrombocytosis: Secondary | ICD-10-CM

## 2020-11-08 DIAGNOSIS — N049 Nephrotic syndrome with unspecified morphologic changes: Secondary | ICD-10-CM | POA: Diagnosis not present

## 2020-11-08 DIAGNOSIS — E1122 Type 2 diabetes mellitus with diabetic chronic kidney disease: Secondary | ICD-10-CM | POA: Diagnosis not present

## 2020-11-08 DIAGNOSIS — I5023 Acute on chronic systolic (congestive) heart failure: Secondary | ICD-10-CM | POA: Diagnosis not present

## 2020-11-08 LAB — CBC WITH DIFFERENTIAL/PLATELET
Abs Immature Granulocytes: 0.05 10*3/uL (ref 0.00–0.07)
Basophils Absolute: 0.1 10*3/uL (ref 0.0–0.1)
Basophils Relative: 1 %
Eosinophils Absolute: 0.3 10*3/uL (ref 0.0–0.5)
Eosinophils Relative: 3 %
HCT: 24.2 % — ABNORMAL LOW (ref 36.0–46.0)
Hemoglobin: 7.7 g/dL — ABNORMAL LOW (ref 12.0–15.0)
Immature Granulocytes: 1 %
Lymphocytes Relative: 20 %
Lymphs Abs: 2.1 10*3/uL (ref 0.7–4.0)
MCH: 29.3 pg (ref 26.0–34.0)
MCHC: 31.8 g/dL (ref 30.0–36.0)
MCV: 92 fL (ref 80.0–100.0)
Monocytes Absolute: 0.8 10*3/uL (ref 0.1–1.0)
Monocytes Relative: 8 %
Neutro Abs: 7.6 10*3/uL (ref 1.7–7.7)
Neutrophils Relative %: 67 %
Platelets: 372 10*3/uL (ref 150–400)
RBC: 2.63 MIL/uL — ABNORMAL LOW (ref 3.87–5.11)
RDW: 15.1 % (ref 11.5–15.5)
WBC: 10.9 10*3/uL — ABNORMAL HIGH (ref 4.0–10.5)
nRBC: 0 % (ref 0.0–0.2)

## 2020-11-08 LAB — BASIC METABOLIC PANEL
Anion gap: 12 (ref 5–15)
BUN: 57 mg/dL — ABNORMAL HIGH (ref 6–20)
CO2: 30 mmol/L (ref 22–32)
Calcium: 8.8 mg/dL — ABNORMAL LOW (ref 8.9–10.3)
Chloride: 101 mmol/L (ref 98–111)
Creatinine, Ser: 4.05 mg/dL — ABNORMAL HIGH (ref 0.44–1.00)
GFR, Estimated: 12 mL/min — ABNORMAL LOW (ref >60)
Glucose, Bld: 223 mg/dL — ABNORMAL HIGH (ref 70–99)
Potassium: 4.3 mmol/L (ref 3.5–5.1)
Sodium: 143 mmol/L (ref 135–145)

## 2020-11-08 LAB — MAGNESIUM: Magnesium: 1.7 mg/dL (ref 1.7–2.4)

## 2020-11-08 LAB — GLUCOSE, CAPILLARY
Glucose-Capillary: 175 mg/dL — ABNORMAL HIGH (ref 70–99)
Glucose-Capillary: 167 mg/dL — ABNORMAL HIGH (ref 70–99)
Glucose-Capillary: 62 mg/dL — ABNORMAL LOW (ref 70–99)
Glucose-Capillary: 77 mg/dL (ref 70–99)
Glucose-Capillary: 155 mg/dL — ABNORMAL HIGH (ref 70–99)
Glucose-Capillary: 244 mg/dL — ABNORMAL HIGH (ref 70–99)
Glucose-Capillary: 210 mg/dL — ABNORMAL HIGH (ref 70–99)

## 2020-11-08 NOTE — Progress Notes (Signed)
PROGRESS NOTE    Terry Arroyo  FBP:[id number] DOB: Apr 27, 1961 DOA: 11/01/2020 PCP: Kirk Ruths, MD   Chief complaint.  Shortness of breath. Brief Narrative:  Patient is a 59 year old female with history oftype 2 diabetes, essential hypertension, history of CVA, coronary disease, chronic systolic congestive heart failure with ejection fraction 30-35%(03/21).She came to the hospital complaining increase short of breath, chest pain, and leg edema. She also has worsening renal function. She has been seen by nephrology and cardiology, she is started on IV Lasix drip. Patient renal function is getting worse, short of breath is not getting better. Permacath isplaced on 11/19 and patient dialyzed.   Assessment & Plan:   Principal Problem:   Chest pain Active Problems:   DM2 (diabetes mellitus, type 2) (HCC)   Acute on chronic systolic CHF (congestive heart failure) (HCC)   Leg edema   COPD exacerbation (HCC)   SOB (shortness of breath)   Nephrotic syndrome  #1.  Acute on chronic systolic congestive heart failure. Patient volume status much better today after dialysis.  She is less short of breath today.  2.  Acute kidney failure on chronic kidney disease stage IV/V. Nephrotic syndrome. Patient has a permacath placed yesterday, patient received dialysis yesterday and today.  She is tolerating dialysis.  3.  Chest pain.  No recurrence.  4.  COPD.  No bronchospasm.  5. Type 2 diabetes uncontrolled with hyperglycemia. Continue current regimen.  6.  Anemia of chronic kidney disease. Hemoglobin 7.7 today.  Will transfuse as needed. B12 and iron level adequate.      DVT prophylaxis: Heparin Code Status: Full Family Communication: None Disposition Plan:  .   Status is: Inpatient  Remains inpatient appropriate because:Inpatient level of care appropriate due to severity of illness   Dispo: The patient is from: Home              Anticipated d/c is to: Home               Anticipated d/c date is: 3 days              Patient currently is not medically stable to d/c.        I/O last 3 completed shifts: In: 1297.5 [P.O.:960; I.V.:237.5; IV Piggyback:100] Out: 2201 [Urine:2200; Stool:1] No intake/output data recorded.     Consultants:   Nephrology.  Procedures: Permacath  Antimicrobials: None  Subjective: Seen patient at dialysis.  Patient feels much better today, short of breath essentially resolved.  No cough. No abdominal pain nausea vomiting. No fever or chills.  Objective: Vitals:   11/08/20 1215 11/08/20 1230 11/08/20 1245 11/08/20 1300  BP: (!) 147/74 (!) 163/77 (!) 168/79 (!) 161/78  Pulse: 67 67 66 67  Resp: 17 (!) 24 20 (!) 21  Temp:      TempSrc:      SpO2: 100% 100% 99% 100%  Weight:      Height:        Intake/Output Summary (Last 24 hours) at 11/08/2020 1324 Last data filed at 11/08/2020 0700 Gross per 24 hour  Intake 600 ml  Output 1201 ml  Net -601 ml   Filed Weights   11/07/20 0355 11/07/20 0831 11/08/20 0502  Weight: 83.9 kg 83.9 kg 82.9 kg    Examination:  General exam: Appears calm and comfortable  Respiratory system: Clear to auscultation. Respiratory effort normal. Cardiovascular system: S1 & S2 heard, RRR. No JVD, murmurs, rubs, gallops or clicks. No pedal edema. Gastrointestinal system:  Abdomen is nondistended, soft and nontender. No organomegaly or masses felt. Normal bowel sounds heard. Central nervous system: Alert and oriented. No focal neurological deficits. Extremities: Symmetric  Skin: No rashes, lesions or ulcers Psychiatry:  Mood & affect appropriate.     Data Reviewed: I have personally reviewed following labs and imaging studies  CBC: Recent Labs  Lab 11/01/20 1520 11/07/20 0538 11/08/20 0530  WBC 11.7* 15.4* 10.9*  NEUTROABS  --  11.7* 7.6  HGB 9.1* 7.9* 7.7*  HCT 28.0* 24.8* 24.2*  MCV 89.5 91.9 92.0  PLT 469* 351 106   Basic Metabolic Panel: Recent Labs  Lab  11/04/20 0543 11/05/20 0548 11/06/20 0533 11/07/20 0538 11/08/20 0530  NA 140 143 143 140 143  K 3.7 4.0 4.8 4.7 4.3  CL 105 106 104 101 101  CO2 27 26 24 25 30   GLUCOSE 211* 170* 193* 212* 223*  BUN 58* 70* 71* 74* 57*  CREATININE 3.79* 3.90* 4.32* 4.71* 4.05*  CALCIUM 8.1* 8.8* 9.1 9.0 8.8*  MG 2.0 1.9 1.8 1.7 1.7   GFR: Estimated Creatinine Clearance: 16.4 mL/min (A) (by C-G formula based on SCr of 4.05 mg/dL (H)). Liver Function Tests: Recent Labs  Lab 11/07/20 0538  AST 11*  ALT 12  ALKPHOS 58  BILITOT 0.7  PROT 6.4*  ALBUMIN 3.6   No results for input(s): LIPASE, AMYLASE in the last 168 hours. No results for input(s): AMMONIA in the last 168 hours. Coagulation Profile: No results for input(s): INR, PROTIME in the last 168 hours. Cardiac Enzymes: No results for input(s): CKTOTAL, CKMB, CKMBINDEX, TROPONINI in the last 168 hours. BNP (last 3 results) No results for input(s): PROBNP in the last 8760 hours. HbA1C: No results for input(s): HGBA1C in the last 72 hours. CBG: Recent Labs  Lab 11/07/20 2032 11/07/20 2104 11/07/20 2225 11/08/20 0103 11/08/20 0752  GLUCAP 35* 187* 122* 175* 167*   Lipid Profile: No results for input(s): CHOL, HDL, LDLCALC, TRIG, CHOLHDL, LDLDIRECT in the last 72 hours. Thyroid Function Tests: No results for input(s): TSH, T4TOTAL, FREET4, T3FREE, THYROIDAB in the last 72 hours. Anemia Panel: Recent Labs    11/07/20 1453  VITAMINB12 388  TIBC 174*  IRON 44   Sepsis Labs: No results for input(s): PROCALCITON, LATICACIDVEN in the last 168 hours.  Recent Results (from the past 240 hour(s))  Respiratory Panel by RT PCR (Flu A&B, Covid) - Nasopharyngeal Swab     Status: None   Collection Time: 11/01/20  9:18 PM   Specimen: Nasopharyngeal Swab  Result Value Ref Range Status   SARS Coronavirus 2 by RT PCR NEGATIVE NEGATIVE Final    Comment: (NOTE) SARS-CoV-2 target nucleic acids are NOT DETECTED.  The SARS-CoV-2 RNA is  generally detectable in upper respiratoy specimens during the acute phase of infection. The lowest concentration of SARS-CoV-2 viral copies this assay can detect is 131 copies/mL. A negative result does not preclude SARS-Cov-2 infection and should not be used as the sole basis for treatment or other patient management decisions. A negative result may occur with  improper specimen collection/handling, submission of specimen other than nasopharyngeal swab, presence of viral mutation(s) within the areas targeted by this assay, and inadequate number of viral copies (<131 copies/mL). A negative result must be combined with clinical observations, patient history, and epidemiological information. The expected result is Negative.  Fact Sheet for Patients:  PinkCheek.be  Fact Sheet for Healthcare Providers:  GravelBags.it  This test is no t yet approved or cleared by  the [employer] and  has been authorized for detection and/or diagnosis of SARS-CoV-2 by FDA under an Emergency Use Authorization (EUA). This EUA will remain  in effect (meaning this test can be used) for the duration of the COVID-19 declaration under Section 564(b)(1) of the Act, 21 U.S.C. section 360bbb-3(b)(1), unless the authorization is terminated or revoked sooner.     Influenza A by PCR NEGATIVE NEGATIVE Final   Influenza B by PCR NEGATIVE NEGATIVE Final    Comment: (NOTE) The Xpert Xpress SARS-CoV-2/FLU/RSV assay is intended as an aid in  the diagnosis of influenza from Nasopharyngeal swab specimens and  should not be used as a sole basis for treatment. Nasal washings and  aspirates are unacceptable for Xpert Xpress SARS-CoV-2/FLU/RSV  testing.  Fact Sheet for Patients: PinkCheek.be  Fact Sheet for Healthcare Providers: GravelBags.it  This test is not yet approved or cleared by the [place] FDA and  has been authorized for detection and/or diagnosis of SARS-CoV-2 by  FDA under an Emergency Use Authorization (EUA). This EUA will remain  in effect (meaning this test can be used) for the duration of the  Covid-19 declaration under Section 564(b)(1) of the Act, 21  U.S.C. section 360bbb-3(b)(1), unless the authorization is  terminated or revoked. Performed at [facility], [address] [place], [place] [address]   MRSA PCR Screening     Status: None   Collection Time: 11/07/20  1:30 AM   Specimen: Nasal Mucosa; Nasopharyngeal  Result Value Ref Range Status   MRSA by PCR NEGATIVE NEGATIVE Final    Comment:        The GeneXpert MRSA Assay (FDA approved for NASAL specimens only), is one component of a comprehensive MRSA colonization surveillance program. It is not intended to diagnose MRSA infection nor to guide or monitor treatment for MRSA infections. Performed at [facility], [address], [place],  [address]          Radiology Studies: PERIPHERAL VASCULAR CATHETERIZATION  Result Date: 11/07/2020 See op note       Scheduled Meds: . amLODipine  10 mg Oral Daily  . aspirin EC  81 mg Oral Daily  . atorvastatin  80 mg Oral q1800  . carvedilol  25 mg Oral BID WC  . Chlorhexidine Gluconate Cloth  6 each Topical Q0600  . famotidine  10 mg Oral Daily  . fenofibrate  160 mg Oral q1800  . heparin  5,000 Units Subcutaneous Q8H  . insulin aspart  0-20 Units Subcutaneous TID WC  . insulin aspart  0-5 Units Subcutaneous QHS  . insulin aspart  4 Units Subcutaneous TID WC  . insulin glargine  30 Units Subcutaneous Daily  . isosorbide mononitrate  30 mg Oral Daily  . mirtazapine  30 mg Oral QHS  . mometasone-formoterol  2 puff Inhalation BID  . polyethylene glycol  17 g Oral Daily  . prednisoLONE acetate  1 drop Right Eye QID  . predniSONE  20 mg Oral Q breakfast  . senna-docusate  2 tablet Oral BID  . sodium chloride  flush  3 mL Intravenous Q12H  . topiramate  25 mg Oral Daily  . cyanocobalamin  1,000 mcg Oral Daily   Continuous Infusions: . sodium chloride 250 mL (11/07/20 0522)     LOS: 7 days    Time spent: 27 minutes    Sharen Hones, MD [facility]   To contact the attending provider between 7A-7P or the covering provider during after  hours 7P-7A, please log into the web site [email or web address] and access using universal [place] password for that web site. If you do not have the password, please call the hospital operator.  11/08/2020, 1:24 PM

## 2020-11-08 NOTE — Plan of Care (Signed)
  Problem: Health Behavior/Discharge Planning: Goal: Ability to manage health-related needs will improve Outcome: Progressing   Problem: Clinical Measurements: Goal: Diagnostic test results will improve Outcome: Progressing   Problem: Clinical Measurements: Goal: Cardiovascular complication will be avoided Outcome: Progressing   

## 2020-11-08 NOTE — Progress Notes (Signed)
Mobility Specialist - Progress Note   11/08/20 1107  Mobility  Activity Contraindicated/medical hold  Mobility performed by Mobility specialist    Pt currently out of room for dialysis tx. Will hold and re-attempt session when pt is available.    Maleeka Sabatino Mobility Specialist  11/08/20, 11:08 AM

## 2020-11-08 NOTE — TOC Progression Note (Addendum)
Transition of Care [facility]) - Progression Note    Patient Details  Name: Terry Arroyo MRN: [id number] Date of Birth: 1961/07/03  Transition of [facility]) CM/SW Contact  Izola Price, RN Phone Number: 11/08/2020, 10:18 AM  Clinical Narrative:  1/20 1015 Provider notes indicate DC > 3 days on 11/19. Dialysis 11/19. Right IJ access placed 11/19. Oxygen 2L/[place] today's flowsheets. Simmie Davies RN CM      Expected Discharge Plan: [place] Barriers to Discharge: Continued Medical Work up  Expected Discharge Plan and Services Expected Discharge Plan: [place] Choice: [place] arrangements for the past 2 months: Apartment                           HH Arranged: RN, Nurse's Aide, PT, OT HH Agency: [facility] Date [place]: 11/03/20 Time [place]: [id number] Representative spoke with at [place]: [place] ([place]) Interventions    Readmission Risk Interventions Readmission Risk Prevention Plan 11/03/2020 10/23/2020 10/12/2020  Transportation Screening Complete Complete Complete  Medication Review Press photographer) Complete Complete Complete  PCP or Specialist appointment within 3-5 days of discharge Not Complete Complete Complete  PCP/Specialist Appt Not Complete comments To be scheduled by unit secretary. - -  HRI or Home Care Consult Complete Complete -  SW Recovery Care/Counseling Consult Complete Complete Complete  Palliative Care Screening Not Applicable Not Applicable Not Applicable  Skilled Nursing Facility Patient Refused Not Applicable Not Complete  SNF Comments Patient refuses states she is able to take care of herself. - Patient pending evaluation  Some recent data might be hidden

## 2020-11-08 NOTE — Progress Notes (Signed)
Terry Arroyo  MRN: [id number]  DOB/AGE: 09-02-1961 59 y.o.  Primary Care Physician:Anderson, Ocie [place], MD  Admit date: 11/01/2020  Chief Complaint:  Chief Complaint  Patient presents with  . Shortness of Breath  . Back Pain    S-Pt presented on  11/01/2020 with  Chief Complaint  Patient presents with  . Shortness of Breath  . Back Pain  . Patient offers no specific physical complaints Patient was seen on dialysis Patient tolerating treatment well  Medications   . amLODipine  10 mg Oral Daily  . aspirin EC  81 mg Oral Daily  . atorvastatin  80 mg Oral q1800  . carvedilol  25 mg Oral BID WC  . Chlorhexidine Gluconate Cloth  6 each Topical Q0600  . famotidine  10 mg Oral Daily  . fenofibrate  160 mg Oral q1800  . heparin  5,000 Units Subcutaneous Q8H  . insulin aspart  0-20 Units Subcutaneous TID WC  . insulin aspart  0-5 Units Subcutaneous QHS  . insulin aspart  4 Units Subcutaneous TID WC  . insulin glargine  30 Units Subcutaneous Daily  . isosorbide mononitrate  30 mg Oral Daily  . mirtazapine  30 mg Oral QHS  . mometasone-formoterol  2 puff Inhalation BID  . polyethylene glycol  17 g Oral Daily  . prednisoLONE acetate  1 drop Right Eye QID  . predniSONE  20 mg Oral Q breakfast  . senna-docusate  2 tablet Oral BID  . sodium chloride flush  3 mL Intravenous Q12H  . topiramate  25 mg Oral Daily  . cyanocobalamin  1,000 mcg Oral Daily         VZD:GLOVF from the symptoms mentioned above,there are no other symptoms referable to all systems reviewed.  Physical Exam: Vital signs in last 24 hours: Temp:  [97.8 F (36.6 C)-98.6 F (37 C)] 98.6 F (37 C) (11/20 0751) Pulse Rate:  [57-92] 65 (11/20 0751) Resp:  [15-20] 16 (11/20 0751) BP: (110-169)/(56-88) 153/72 (11/20 0751) SpO2:  [94 %-100 %] 97 % (11/20 0751) Weight:  [82.9 kg] 82.9 kg (11/20 0502) Weight change: 0.03 kg Last BM Date: 11/08/20  Intake/Output from previous day: 11/19 0701 - 11/20  0700 In: 600 [P.O.:600] Out: 1201 [Urine:1200; Stool:1] No intake/output data recorded.   Physical Exam: General- pt is awake,alert, oriented to time place and person  Resp- No acute REsp distress, CTA B/L NO Rhonchi  CVS- S1S2 regular in rate and rhythm  GIT- BS+, soft, Non tender , Non distended  EXT-trace LE Edema ,no Cyanosis  Access-tunneled cath   Lab Results:  CBC  Recent Labs    11/07/20 0538 11/08/20 0530  WBC 15.4* 10.9*  HGB 7.9* 7.7*  HCT 24.8* 24.2*  PLT 351 372    BMET  Recent Labs    11/07/20 0538 11/08/20 0530  NA 140 143  K 4.7 4.3  CL 101 101  CO2 25 30  GLUCOSE 212* 223*  BUN 74* 57*  CREATININE 4.71* 4.05*  CALCIUM 9.0 8.8*      Most recent Creatinine trend  Lab Results  Component Value Date   CREATININE 4.05 (H) 11/08/2020   CREATININE 4.71 (H) 11/07/2020   CREATININE 4.32 (H) 11/06/2020      MICRO   Recent Results (from the past 240 hour(s))  Respiratory Panel by RT PCR (Flu A&B, Covid) - Nasopharyngeal Swab     Status: None   Collection Time: 11/01/20  9:18 PM   Specimen: Nasopharyngeal Swab  Result Value Ref Range Status   SARS Coronavirus 2 by RT PCR NEGATIVE NEGATIVE Final    Comment: (NOTE) SARS-CoV-2 target nucleic acids are NOT DETECTED.  The SARS-CoV-2 RNA is generally detectable in upper respiratoy specimens during the acute phase of infection. The lowest concentration of SARS-CoV-2 viral copies this assay can detect is 131 copies/mL. A negative result does not preclude SARS-Cov-2 infection and should not be used as the sole basis for treatment or other patient management decisions. A negative result may occur with  improper specimen collection/handling, submission of specimen other than nasopharyngeal swab, presence of viral mutation(s) within the areas targeted by this assay, and inadequate number of viral copies (<131 copies/mL). A negative result must be combined with clinical observations, patient  history, and epidemiological information. The expected result is Negative.  Fact Sheet for Patients:  PinkCheek.be  Fact Sheet for Healthcare Providers:  GravelBags.it  This test is no t yet approved or cleared by the [place] FDA and  has been authorized for detection and/or diagnosis of SARS-CoV-2 by FDA under an Emergency Use Authorization (EUA). This EUA will remain  in effect (meaning this test can be used) for the duration of the COVID-19 declaration under Section 564(b)(1) of the Act, 21 U.S.C. section 360bbb-3(b)(1), unless the authorization is terminated or revoked sooner.     Influenza A by PCR NEGATIVE NEGATIVE Final   Influenza B by PCR NEGATIVE NEGATIVE Final    Comment: (NOTE) The Xpert Xpress SARS-CoV-2/FLU/RSV assay is intended as an aid in  the diagnosis of influenza from Nasopharyngeal swab specimens and  should not be used as a sole basis for treatment. Nasal washings and  aspirates are unacceptable for Xpert Xpress SARS-CoV-2/FLU/RSV  testing.  Fact Sheet for Patients: PinkCheek.be  Fact Sheet for Healthcare Providers: GravelBags.it  This test is not yet approved or cleared by the [place] FDA and  has been authorized for detection and/or diagnosis of SARS-CoV-2 by  FDA under an Emergency Use Authorization (EUA). This EUA will remain  in effect (meaning this test can be used) for the duration of the  Covid-19 declaration under Section 564(b)(1) of the Act, 21  U.S.C. section 360bbb-3(b)(1), unless the authorization is  terminated or revoked. Performed at [facility], [address] [place], [place] [address]   MRSA PCR Screening     Status: None   Collection Time: 11/07/20  1:30 AM   Specimen: Nasal Mucosa; Nasopharyngeal  Result Value Ref Range Status   MRSA by PCR NEGATIVE NEGATIVE Final    Comment:        The  GeneXpert MRSA Assay (FDA approved for NASAL specimens only), is one component of a comprehensive MRSA colonization surveillance program. It is not intended to diagnose MRSA infection nor to guide or monitor treatment for MRSA infections. Performed at [facility], [address], [place], [place] [address]          Impression:   1)Renal     CKD stage 4 now most likely ESRD. Patient did have renal biopsy on September 23 which showed the patient has diabetic nephropathy with interstitial nephritis Data from care everywhere is reviewed-this is a note from September 17, 2020 Discussed preliminary biopsy report with patient. Mild acute interstitial nephritis suggestive of hypersensitivity. Also with diabetic kidney disease.  CKD since 2020 CKD secondary to diabetes mellitus  Patient first day of dialysis was on November 07, 2020 Patient had permacath placed on November 07, 2020   2)HTN  Blood pressure is stable    3)Anemia of chronic disease  CBC Latest Ref Rng & Units 11/08/2020 11/07/2020 11/01/2020  WBC 4.0 - 10.5 K/uL 10.9(H) 15.4(H) 11.7(H)  Hemoglobin 12.0 - 15.0 g/dL 7.7(L) 7.9(L) 9.1(L)  Hematocrit 36 - 46 % 24.2(L) 24.8(L) 28.0(L)  Platelets 150 - 400 K/uL 372 351 469(H)       HGb is not at goal (9--11) On Epogen  4) Secondary hyperparathyroidism -CKD Mineral-Bone Disorder    Lab Results  Component Value Date   CALCIUM 8.8 (L) 11/08/2020   PHOS 6.0 (H) 10/12/2020    Secondary Hyperparathyroidism present Phosphorus is not at goal.   5) diabetes mellitus type 2 Primary team is following  6) Electrolytes   BMP Latest Ref Rng & Units 11/08/2020 11/07/2020 11/06/2020  Glucose 70 - 99 mg/dL 223(H) 212(H) 193(H)  BUN 6 - 20 mg/dL 57(H) 74(H) 71(H)  Creatinine 0.44 - 1.00 mg/dL 4.05(H) 4.71(H) 4.32(H)  Sodium 135 - 145 mmol/L 143 140 143  Potassium 3.5 - 5.1 mmol/L 4.3 4.7 4.8  Chloride 98 - 111 mmol/L 101 101 104  CO2 22 - 32  mmol/L 30 25 24   Calcium 8.9 - 10.3 mg/dL 8.8(L) 9.0 9.1     Sodium Normonatremic   Potassium Normokalemic    7)Acid base    Co2 at goal     Plan:   We will continue the current treatment plan     Maddelyn Rocca s [facility] 11/08/2020, 10:24 AM

## 2020-11-09 ENCOUNTER — Inpatient Hospital Stay: Payer: Medicare HMO

## 2020-11-09 DIAGNOSIS — N184 Chronic kidney disease, stage 4 (severe): Secondary | ICD-10-CM | POA: Diagnosis not present

## 2020-11-09 DIAGNOSIS — N178 Other acute kidney failure: Secondary | ICD-10-CM

## 2020-11-09 DIAGNOSIS — R079 Chest pain, unspecified: Secondary | ICD-10-CM | POA: Diagnosis not present

## 2020-11-09 DIAGNOSIS — I5023 Acute on chronic systolic (congestive) heart failure: Secondary | ICD-10-CM | POA: Diagnosis not present

## 2020-11-09 LAB — BASIC METABOLIC PANEL
Anion gap: 8 (ref 5–15)
BUN: 46 mg/dL — ABNORMAL HIGH (ref 6–20)
CO2: 27 mmol/L (ref 22–32)
Calcium: 8.1 mg/dL — ABNORMAL LOW (ref 8.9–10.3)
Chloride: 102 mmol/L (ref 98–111)
Creatinine, Ser: 3.09 mg/dL — ABNORMAL HIGH (ref 0.44–1.00)
GFR, Estimated: 17 mL/min — ABNORMAL LOW (ref >60)
Glucose, Bld: 182 mg/dL — ABNORMAL HIGH (ref 70–99)
Potassium: 4.2 mmol/L (ref 3.5–5.1)
Sodium: 137 mmol/L (ref 135–145)

## 2020-11-09 LAB — CBC WITH DIFFERENTIAL/PLATELET
Abs Immature Granulocytes: 0.05 10*3/uL (ref 0.00–0.07)
Basophils Absolute: 0 10*3/uL (ref 0.0–0.1)
Basophils Relative: 0 %
Eosinophils Absolute: 0.1 10*3/uL (ref 0.0–0.5)
Eosinophils Relative: 0 %
HCT: 23.3 % — ABNORMAL LOW (ref 36.0–46.0)
Hemoglobin: 7.4 g/dL — ABNORMAL LOW (ref 12.0–15.0)
Immature Granulocytes: 0 %
Lymphocytes Relative: 13 %
Lymphs Abs: 1.7 10*3/uL (ref 0.7–4.0)
MCH: 28.8 pg (ref 26.0–34.0)
MCHC: 31.8 g/dL (ref 30.0–36.0)
MCV: 90.7 fL (ref 80.0–100.0)
Monocytes Absolute: 0.8 10*3/uL (ref 0.1–1.0)
Monocytes Relative: 6 %
Neutro Abs: 10.1 10*3/uL — ABNORMAL HIGH (ref 1.7–7.7)
Neutrophils Relative %: 81 %
Platelets: 322 10*3/uL (ref 150–400)
RBC: 2.57 MIL/uL — ABNORMAL LOW (ref 3.87–5.11)
RDW: 14.6 % (ref 11.5–15.5)
WBC: 12.7 10*3/uL — ABNORMAL HIGH (ref 4.0–10.5)
nRBC: 0 % (ref 0.0–0.2)

## 2020-11-09 LAB — GLUCOSE, CAPILLARY
Glucose-Capillary: 155 mg/dL — ABNORMAL HIGH (ref 70–99)
Glucose-Capillary: 121 mg/dL — ABNORMAL HIGH (ref 70–99)
Glucose-Capillary: 115 mg/dL — ABNORMAL HIGH (ref 70–99)
Glucose-Capillary: 163 mg/dL — ABNORMAL HIGH (ref 70–99)

## 2020-11-09 IMAGING — DX DG CHEST 1V
1 series · 1 of 1 positions shown · non-contrast
Comparison: [DATE]

CLINICAL DATA: Acute tubular necrosis.

EXAM:
CHEST  1 VIEW

[chest ap]
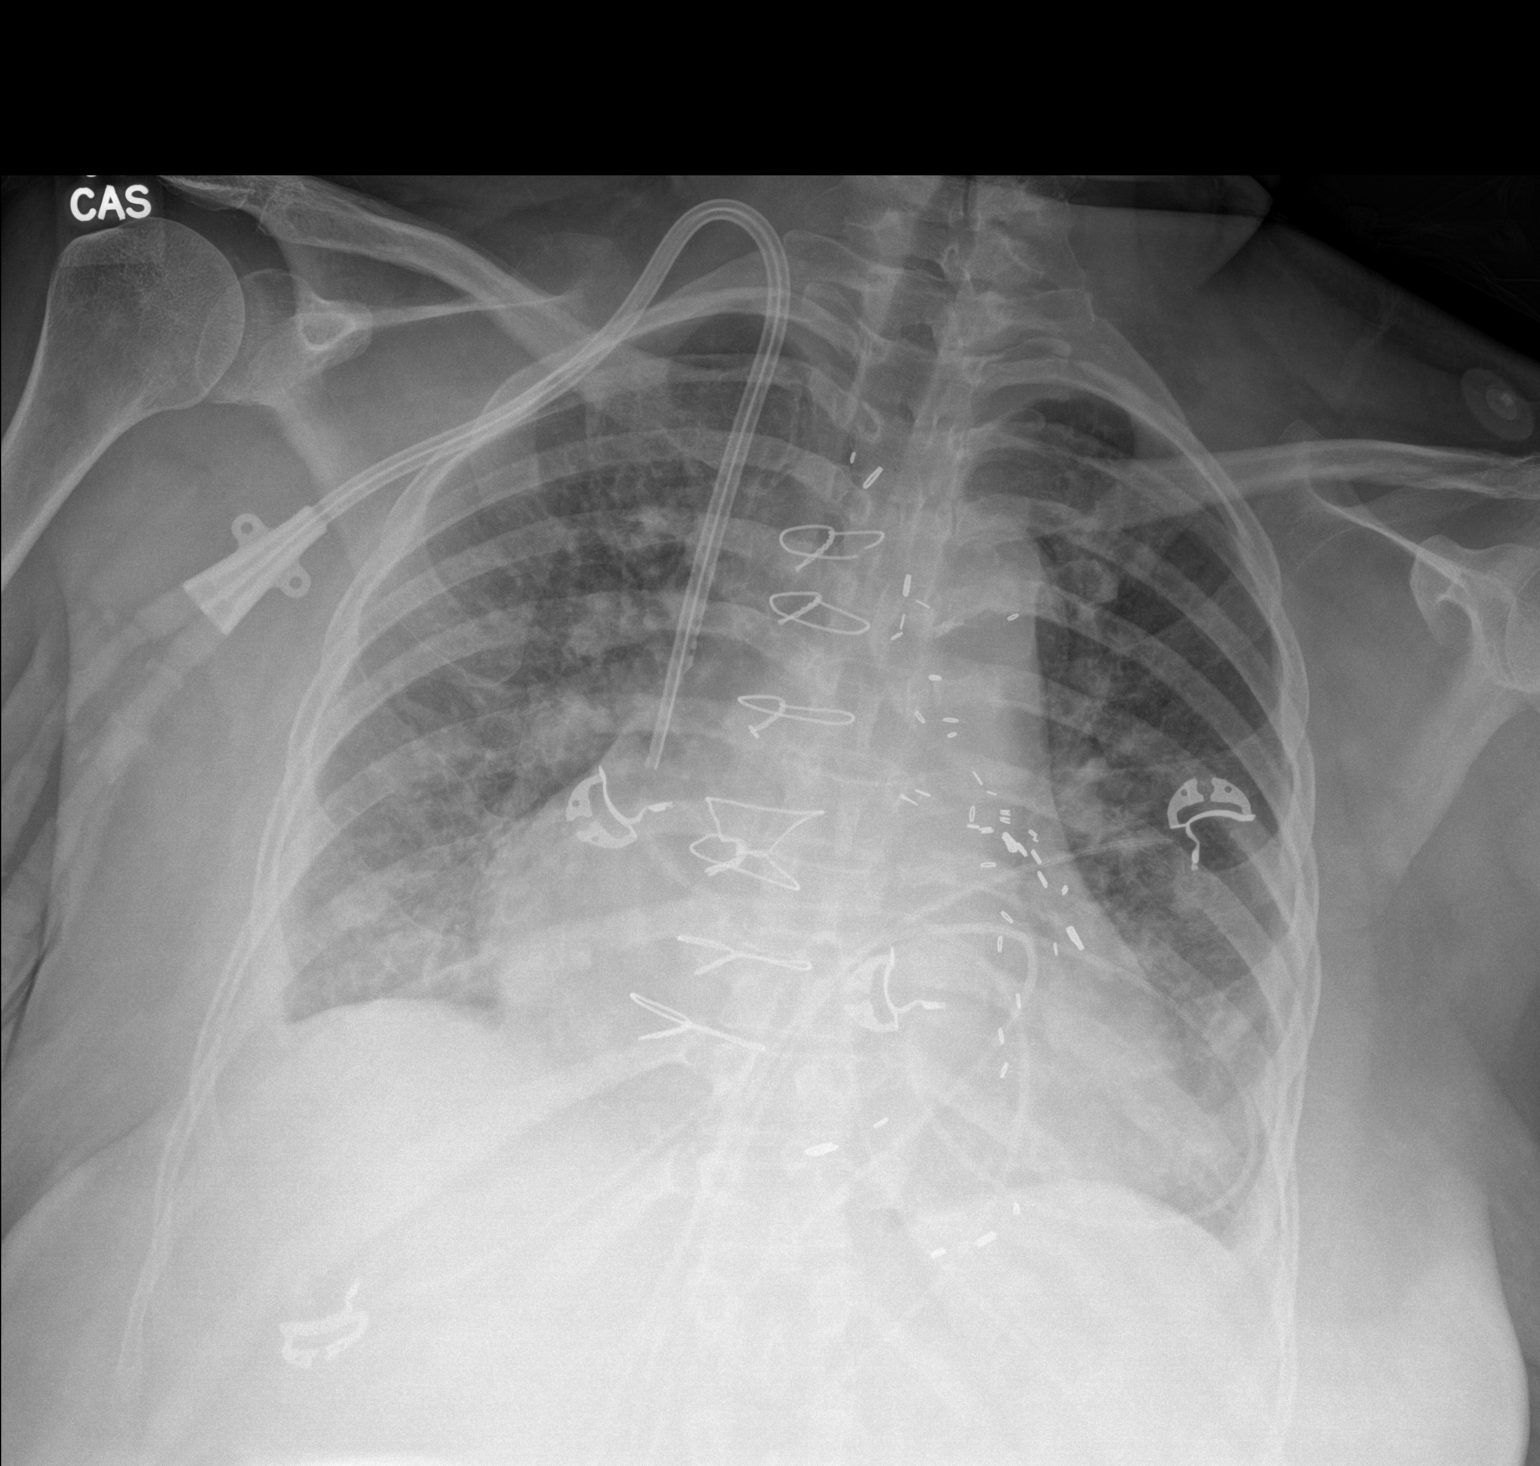

[1 of 1 positions shown; findings below may reference images not displayed]

FINDINGS: Interval placement of right IJ central venous catheter with tip over
the SVC. No pneumothorax. Lungs are adequately inflated with mild
hazy prominence of the central pulmonary vessels likely mild
vascular congestion. Minimal blunting of the costophrenic angles
unchanged likely small amount of pleural fluid/pleuroparenchymal
scarring. Mild stable cardiomegaly. Remainder of the exam is
unchanged.
IMPRESSION: Mild stable cardiomegaly with suggestion of minimal vascular
congestion. Possible small amount bilateral pleural
fluid/pleuroparenchymal scarring.

## 2020-11-09 MED ORDER — FUROSEMIDE 10 MG/ML IJ SOLN
40.0000 mg | Freq: Once | INTRAMUSCULAR | Status: AC
  Administered 2020-11-09: 40 mg via INTRAVENOUS
  Filled 2020-11-09: qty 4

## 2020-11-09 MED ORDER — HEPARIN SODIUM (PORCINE) 1000 UNIT/ML DIALYSIS
20.0000 [IU]/kg | INTRAMUSCULAR | Status: DC | PRN
  Filled 2020-11-09: qty 2

## 2020-11-09 MED ORDER — LORAZEPAM 0.5 MG PO TABS: 0.5000 mg | ORAL_TABLET | Freq: Four times a day (QID) | ORAL | Status: DC | PRN

## 2020-11-09 MED ORDER — CITALOPRAM HYDROBROMIDE 20 MG PO TABS
10.0000 mg | ORAL_TABLET | Freq: Every day | ORAL | Status: DC
  Administered 2020-11-09 – 2020-11-12 (×4): 10 mg via ORAL
  Filled 2020-11-09 (×4): qty 1

## 2020-11-09 MED ORDER — FUROSEMIDE 40 MG PO TABS
40.0000 mg | ORAL_TABLET | Freq: Two times a day (BID) | ORAL | Status: DC
  Administered 2020-11-09 – 2020-11-12 (×6): 40 mg via ORAL
  Filled 2020-11-09 (×6): qty 1

## 2020-11-09 NOTE — Progress Notes (Addendum)
Patient c/o SOB upon rounding. Patient sitting upright in bed. Placed on 2L [place]. Patient states SOB has improved on [place] Will continue to assess.

## 2020-11-09 NOTE — Progress Notes (Signed)
Patient completed duoneb treatment. Respirations unlabored, 20 BPM. O2 sat 100 on 3L. Patient states she feels breathing has improved. Day nurse at bedside with night shift nurse. Report and handoff given to RN.

## 2020-11-09 NOTE — Progress Notes (Signed)
Patient called nurses station [phone]. RN came to bedside. Patient c/o inability to catch her breath. Patient's O2 sats 100 on 3L. HR 67. Patient tachypneac 32 breaths per minute, shallow. Lung sounds clear on auscultation. Administered duoneb. Patient's RR currently 20 BPM. RN at bedside. No signs of acute distress at this time. RN will stay at bedside to continue monitoring patient.

## 2020-11-09 NOTE — Progress Notes (Signed)
PROGRESS NOTE    Terry Arroyo  POE:[id number] DOB: [birth year] DOA: 11/01/2020 PCP: Kirk Ruths, MD   Chief complaint.  Both Brief Narrative:  Patient is a 59 year old female with history oftype 2 diabetes, essential hypertension, history of CVA, coronary disease, chronic systolic congestive heart failure with ejection fraction 30-35%(03/21).She came to the hospital complaining increase short of breath, chest pain, and leg edema. She also has worsening renal function. She has been seen by nephrology and cardiology, she is started on IV Lasix drip. Patient renal function is getting worse, short of breath is not getting better. Permacath isplaced on 11/19and patient dialyzed.   Assessment & Plan:   Principal Problem:   Chest pain Active Problems:   DM2 (diabetes mellitus, type 2) (HCC)   Acute on chronic systolic CHF (congestive heart failure) (HCC)   Leg edema   Acute renal failure superimposed on stage 4 chronic kidney disease (HCC)   COPD exacerbation (HCC)   SOB (shortness of breath)   Nephrotic syndrome   Reactive thrombocytosis  #1. Acute on chronic systolic congestive heart failure. Patient has received hemodialysis yesterday.  She still has significant short of breath intermittently. Continue to follow.  2.  Anxiety and depression. Patient is depressed, continue home dose of Remeron, add citalopram 10 mg daily.  Patient also has significant anxiety, which could be partially responsible for shortness of breath.  Will add lower dose as needed Ativan for now.  #3.  Acute kidney injury on chronic kidney disease stage IV/V. Followed by nephrology.  4.  COPD. No bronchospasm.  5.  Type 2 diabetes uncontrolled with hyperglycemia. Continue current regimen.  6.  Anemia chronic kidney disease. Hemoglobin trending down to 7.4 today.  Continue to follow, transfuse as needed. No active bleeding, iron B12 level adequate.    DVT prophylaxis: Heparin Code  Status: Full Family Communication: None Disposition Plan:     Status is: Inpatient  Remains inpatient appropriate because:Inpatient level of care appropriate due to severity of illness   Dispo: The patient is from: Home              Anticipated d/c is to: Home              Anticipated d/c date is: > 3 days              Patient currently is not medically stable to d/c.        I/O last 3 completed shifts: In: 1200 [P.O.:1200] Out: 1201 [Urine:1200; Stool:1] No intake/output data recorded.     Consultants:   Nephrology  Procedures: HD  Antimicrobials: None Subjective: Patient still has sent me short of breath intermittently.  She seems to have severe depression, she also has anxiety. She has no cough.  She has no chest pain.  No palpitation. No abdominal pain or nausea vomiting. No fever or chills.  Objective: Vitals:   11/08/20 2018 11/09/20 0003 11/09/20 0413 11/09/20 0732  BP: (!) 146/87 (!) 165/91 (!) 165/83 (!) 162/82  Pulse: 73 71 67 66  Resp:  19 20   Temp: 98.7 F (37.1 C) 98.9 F (37.2 C) 98.7 F (37.1 C)   TempSrc: Oral Oral Axillary   SpO2: 100% 97% 100% 100%  Weight:   82.7 kg   Height:        Intake/Output Summary (Last 24 hours) at 11/09/2020 1041 Last data filed at 11/09/2020 0614 Gross per 24 hour  Intake 600 ml  Output 0 ml  Net 600 ml  Filed Weights   11/07/20 0831 11/08/20 0502 11/09/20 0413  Weight: 83.9 kg 82.9 kg 82.7 kg    Examination:  General exam: Appears calm and comfortable  Respiratory system: Decreased breathing sounds without crackles wheezes.Marland Kitchen Respiratory effort normal. Cardiovascular system: S1 & S2 heard, RRR. No JVD, murmurs, rubs, gallops or clicks. 2+ pedal edema. Gastrointestinal system: Abdomen is nondistended, soft and nontender. No organomegaly or masses felt. Normal bowel sounds heard. Central nervous system: Alert and oriented. No focal neurological deficits. Extremities: Symmetric  Skin: No rashes,  lesions or ulcers Psychiatry: Depressed mood.    Data Reviewed: I have personally reviewed following labs and imaging studies  CBC: Recent Labs  Lab 11/07/20 0538 11/08/20 0530 11/09/20 0502  WBC 15.4* 10.9* 12.7*  NEUTROABS 11.7* 7.6 10.1*  HGB 7.9* 7.7* 7.4*  HCT 24.8* 24.2* 23.3*  MCV 91.9 92.0 90.7  PLT 351 372 502   Basic Metabolic Panel: Recent Labs  Lab 11/04/20 0543 11/04/20 0543 11/05/20 0548 11/06/20 0533 11/07/20 0538 11/08/20 0530 11/09/20 0502  NA 140   < > 143 143 140 143 137  K 3.7   < > 4.0 4.8 4.7 4.3 4.2  CL 105   < > 106 104 101 101 102  CO2 27   < > 26 24 25 30 27   GLUCOSE 211*   < > 170* 193* 212* 223* 182*  BUN 58*   < > 70* 71* 74* 57* 46*  CREATININE 3.79*   < > 3.90* 4.32* 4.71* 4.05* 3.09*  CALCIUM 8.1*   < > 8.8* 9.1 9.0 8.8* 8.1*  MG 2.0  --  1.9 1.8 1.7 1.7  --    < > = values in this interval not displayed.   GFR: Estimated Creatinine Clearance: 21.5 mL/min (A) (by C-G formula based on SCr of 3.09 mg/dL (H)). Liver Function Tests: Recent Labs  Lab 11/07/20 0538  AST 11*  ALT 12  ALKPHOS 58  BILITOT 0.7  PROT 6.4*  ALBUMIN 3.6   No results for input(s): LIPASE, AMYLASE in the last 168 hours. No results for input(s): AMMONIA in the last 168 hours. Coagulation Profile: No results for input(s): INR, PROTIME in the last 168 hours. Cardiac Enzymes: No results for input(s): CKTOTAL, CKMB, CKMBINDEX, TROPONINI in the last 168 hours. BNP (last 3 results) No results for input(s): PROBNP in the last 8760 hours. HbA1C: No results for input(s): HGBA1C in the last 72 hours. CBG: Recent Labs  Lab 11/08/20 1419 11/08/20 1648 11/08/20 1916 11/08/20 2106 11/09/20 0731  GLUCAP 77 155* 244* 210* 155*   Lipid Profile: No results for input(s): CHOL, HDL, LDLCALC, TRIG, CHOLHDL, LDLDIRECT in the last 72 hours. Thyroid Function Tests: No results for input(s): TSH, T4TOTAL, FREET4, T3FREE, THYROIDAB in the last 72 hours. Anemia  Panel: Recent Labs    11/07/20 1453  VITAMINB12 388  TIBC 174*  IRON 44   Sepsis Labs: No results for input(s): PROCALCITON, LATICACIDVEN in the last 168 hours.  Recent Results (from the past 240 hour(s))  Respiratory Panel by RT PCR (Flu A&B, Covid) - Nasopharyngeal Swab     Status: None   Collection Time: 11/01/20  9:18 PM   Specimen: Nasopharyngeal Swab  Result Value Ref Range Status   SARS Coronavirus 2 by RT PCR NEGATIVE NEGATIVE Final    Comment: (NOTE) SARS-CoV-2 target nucleic acids are NOT DETECTED.  The SARS-CoV-2 RNA is generally detectable in upper respiratoy specimens during the acute phase of infection. The lowest concentration of  SARS-CoV-2 viral copies this assay can detect is 131 copies/mL. A negative result does not preclude SARS-Cov-2 infection and should not be used as the sole basis for treatment or other patient management decisions. A negative result may occur with  improper specimen collection/handling, submission of specimen other than nasopharyngeal swab, presence of viral mutation(s) within the areas targeted by this assay, and inadequate number of viral copies (<131 copies/mL). A negative result must be combined with clinical observations, patient history, and epidemiological information. The expected result is Negative.  Fact Sheet for Patients:  PinkCheek.be  Fact Sheet for Healthcare Providers:  GravelBags.it  This test is no t yet approved or cleared by the [place] FDA and  has been authorized for detection and/or diagnosis of SARS-CoV-2 by FDA under an Emergency Use Authorization (EUA). This EUA will remain  in effect (meaning this test can be used) for the duration of the COVID-19 declaration under Section 564(b)(1) of the Act, 21 U.S.C. section 360bbb-3(b)(1), unless the authorization is terminated or revoked sooner.     Influenza A by PCR NEGATIVE NEGATIVE Final    Influenza B by PCR NEGATIVE NEGATIVE Final    Comment: (NOTE) The Xpert Xpress SARS-CoV-2/FLU/RSV assay is intended as an aid in  the diagnosis of influenza from Nasopharyngeal swab specimens and  should not be used as a sole basis for treatment. Nasal washings and  aspirates are unacceptable for Xpert Xpress SARS-CoV-2/FLU/RSV  testing.  Fact Sheet for Patients: PinkCheek.be  Fact Sheet for Healthcare Providers: GravelBags.it  This test is not yet approved or cleared by the [place] FDA and  has been authorized for detection and/or diagnosis of SARS-CoV-2 by  FDA under an Emergency Use Authorization (EUA). This EUA will remain  in effect (meaning this test can be used) for the duration of the  Covid-19 declaration under Section 564(b)(1) of the Act, 21  U.S.C. section 360bbb-3(b)(1), unless the authorization is  terminated or revoked. Performed at [facility], [address] [place], [place] [address]   MRSA PCR Screening     Status: None   Collection Time: 11/07/20  1:30 AM   Specimen: Nasal Mucosa; Nasopharyngeal  Result Value Ref Range Status   MRSA by PCR NEGATIVE NEGATIVE Final    Comment:        The GeneXpert MRSA Assay (FDA approved for NASAL specimens only), is one component of a comprehensive MRSA colonization surveillance program. It is not intended to diagnose MRSA infection nor to guide or monitor treatment for MRSA infections. Performed at [facility], [address] [place], [place] [address]          Radiology Studies: No results found.      Scheduled Meds:  amLODipine  10 mg Oral Daily   aspirin EC  81 mg Oral Daily   atorvastatin  80 mg Oral q1800   carvedilol  25 mg Oral BID WC   Chlorhexidine Gluconate Cloth  6 each Topical Q0600   citalopram  10 mg Oral Daily   famotidine  10 mg Oral Daily   fenofibrate  160 mg Oral q1800   furosemide  40  mg Oral BID   heparin  5,000 Units Subcutaneous Q8H   insulin aspart  0-20 Units Subcutaneous TID WC   insulin aspart  0-5 Units Subcutaneous QHS   insulin aspart  4 Units Subcutaneous TID WC   insulin glargine  30 Units Subcutaneous Daily   isosorbide mononitrate  30 mg Oral Daily   mirtazapine  30 mg Oral QHS   mometasone-formoterol  2 puff Inhalation BID   polyethylene glycol  17 g Oral Daily   prednisoLONE acetate  1 drop Right Eye QID   predniSONE  20 mg Oral Q breakfast   senna-docusate  2 tablet Oral BID   sodium chloride flush  3 mL Intravenous Q12H   topiramate  25 mg Oral Daily   cyanocobalamin  1,000 mcg Oral Daily   Continuous Infusions:  sodium chloride 250 mL (11/07/20 0522)     LOS: 8 days    Time spent: 26 minutes    Sharen Hones, MD [facility]   To contact the attending provider between 7A-7P or the covering provider during after hours 7P-7A, please log into the web site [email or web address] and access using universal [place] password for that web site. If you do not have the password, please call the hospital operator.  11/09/2020, 10:41 AM

## 2020-11-09 NOTE — Progress Notes (Signed)
Terry Arroyo  MRN: [id number]  DOB/AGE: [birth year] 58 y.o.  Primary Care Physician:Anderson, Ocie Cornfield, MD  Admit date: 11/01/2020  Chief Complaint:  Chief Complaint  Patient presents with  . Shortness of Breath  . Back Pain    S-Pt presented on  11/01/2020 with  Chief Complaint  Patient presents with  . Shortness of Breath  . Back Pain  . Patient main complaint today visit was shortness of breath.  Medications   . amLODipine  10 mg Oral Daily  . aspirin EC  81 mg Oral Daily  . atorvastatin  80 mg Oral q1800  . carvedilol  25 mg Oral BID WC  . Chlorhexidine Gluconate Cloth  6 each Topical Q0600  . famotidine  10 mg Oral Daily  . fenofibrate  160 mg Oral q1800  . heparin  5,000 Units Subcutaneous Q8H  . insulin aspart  0-20 Units Subcutaneous TID WC  . insulin aspart  0-5 Units Subcutaneous QHS  . insulin aspart  4 Units Subcutaneous TID WC  . insulin glargine  30 Units Subcutaneous Daily  . isosorbide mononitrate  30 mg Oral Daily  . mirtazapine  30 mg Oral QHS  . mometasone-formoterol  2 puff Inhalation BID  . polyethylene glycol  17 g Oral Daily  . prednisoLONE acetate  1 drop Right Eye QID  . predniSONE  20 mg Oral Q breakfast  . senna-docusate  2 tablet Oral BID  . sodium chloride flush  3 mL Intravenous Q12H  . topiramate  25 mg Oral Daily  . cyanocobalamin  1,000 mcg Oral Daily         BZJ:IRCVE from the symptoms mentioned above,there are no other symptoms referable to all systems reviewed.  Physical Exam: Vital signs in last 24 hours: Temp:  [98.2 F (36.8 C)-98.9 F (37.2 C)] 98.7 F (37.1 C) (11/21 0413) Pulse Rate:  [65-73] 67 (11/21 0413) Resp:  [16-24] 20 (11/21 0413) BP: (146-175)/(72-91) 165/83 (11/21 0413) SpO2:  [97 %-100 %] 100 % (11/21 0413) Weight:  [82.7 kg] 82.7 kg (11/21 0413) Weight change: -1.2 kg Last BM Date: 11/08/20  Intake/Output from previous day: 11/20 0701 - 11/21 0700 In: 840 [P.O.:840] Out: 0  No  intake/output data recorded.   Physical Exam: General- pt is awake,alert, oriented to time place and person  Resp- mild acute REsp distress,  Rhonchi+  CVS- S1S2 regular in rate and rhythm  GIT- BS+, soft, Non tender , Non distended  EXT-trace LE Edema ,no Cyanosis  Access-tunneled cath   Lab Results:  CBC  Recent Labs    11/08/20 0530 11/09/20 0502  WBC 10.9* 12.7*  HGB 7.7* 7.4*  HCT 24.2* 23.3*  PLT 372 322    BMET  Recent Labs    11/08/20 0530 11/09/20 0502  NA 143 137  K 4.3 4.2  CL 101 102  CO2 30 27  GLUCOSE 223* 182*  BUN 57* 46*  CREATININE 4.05* 3.09*  CALCIUM 8.8* 8.1*      Most recent Creatinine trend  Lab Results  Component Value Date   CREATININE 3.09 (H) 11/09/2020   CREATININE 4.05 (H) 11/08/2020   CREATININE 4.71 (H) 11/07/2020      MICRO   Recent Results (from the past 240 hour(s))  Respiratory Panel by RT PCR (Flu A&B, Covid) - Nasopharyngeal Swab     Status: None   Collection Time: 11/01/20  9:18 PM   Specimen: Nasopharyngeal Swab  Result Value Ref Range Status   SARS Coronavirus  2 by RT PCR NEGATIVE NEGATIVE Final    Comment: (NOTE) SARS-CoV-2 target nucleic acids are NOT DETECTED.  The SARS-CoV-2 RNA is generally detectable in upper respiratoy specimens during the acute phase of infection. The lowest concentration of SARS-CoV-2 viral copies this assay can detect is 131 copies/mL. A negative result does not preclude SARS-Cov-2 infection and should not be used as the sole basis for treatment or other patient management decisions. A negative result may occur with  improper specimen collection/handling, submission of specimen other than nasopharyngeal swab, presence of viral mutation(s) within the areas targeted by this assay, and inadequate number of viral copies (<131 copies/mL). A negative result must be combined with clinical observations, patient history, and epidemiological information. The expected result is  Negative.  Fact Sheet for Patients:  PinkCheek.be  Fact Sheet for Healthcare Providers:  GravelBags.it  This test is no t yet approved or cleared by the [place] FDA and  has been authorized for detection and/or diagnosis of SARS-CoV-2 by FDA under an Emergency Use Authorization (EUA). This EUA will remain  in effect (meaning this test can be used) for the duration of the COVID-19 declaration under Section 564(b)(1) of the Act, 21 U.S.C. section 360bbb-3(b)(1), unless the authorization is terminated or revoked sooner.     Influenza A by PCR NEGATIVE NEGATIVE Final   Influenza B by PCR NEGATIVE NEGATIVE Final    Comment: (NOTE) The Xpert Xpress SARS-CoV-2/FLU/RSV assay is intended as an aid in  the diagnosis of influenza from Nasopharyngeal swab specimens and  should not be used as a sole basis for treatment. Nasal washings and  aspirates are unacceptable for Xpert Xpress SARS-CoV-2/FLU/RSV  testing.  Fact Sheet for Patients: PinkCheek.be  Fact Sheet for Healthcare Providers: GravelBags.it  This test is not yet approved or cleared by the [place] FDA and  has been authorized for detection and/or diagnosis of SARS-CoV-2 by  FDA under an Emergency Use Authorization (EUA). This EUA will remain  in effect (meaning this test can be used) for the duration of the  Covid-19 declaration under Section 564(b)(1) of the Act, 21  U.S.C. section 360bbb-3(b)(1), unless the authorization is  terminated or revoked. Performed at [facility], [address] [place], [place] [address]   MRSA PCR Screening     Status: None   Collection Time: 11/07/20  1:30 AM   Specimen: Nasal Mucosa; Nasopharyngeal  Result Value Ref Range Status   MRSA by PCR NEGATIVE NEGATIVE Final    Comment:        The GeneXpert MRSA Assay (FDA approved for NASAL specimens only), is  one component of a comprehensive MRSA colonization surveillance program. It is not intended to diagnose MRSA infection nor to guide or monitor treatment for MRSA infections. Performed at [facility], [address], [place], [place] [address]          Impression:   1)Renal     CKD stage 4 now most likely ESRD. Patient did have renal biopsy on September 23 which showed the patient has diabetic nephropathy with interstitial nephritis Data from care everywhere is reviewed-this is a note from September 17, 2020 Discussed preliminary biopsy report with patient. Mild acute interstitial nephritis suggestive of hypersensitivity. Also with diabetic kidney disease.  CKD since 2020 CKD secondary to diabetes mellitus  Patient first day of dialysis was on November 07, 2020 Patient had permacath placed on November 07, 2020   Patient was last dialyzed yesterday No need for renal placement therapy  today  2)HTN    Blood pressure is stable    3)Anemia of chronic disease  CBC Latest Ref Rng & Units 11/09/2020 11/08/2020 11/07/2020  WBC 4.0 - 10.5 K/uL 12.7(H) 10.9(H) 15.4(H)  Hemoglobin 12.0 - 15.0 g/dL 7.4(L) 7.7(L) 7.9(L)  Hematocrit 36 - 46 % 23.3(L) 24.2(L) 24.8(L)  Platelets 150 - 400 K/uL 322 372 351    Results for ILYNN, STAUFFER (MRN [id number]) as of 11/09/2020 07:33  Ref. Range 11/07/2020 14:53  Iron Latest Ref Range: 28 - 170 ug/dL 44  UIBC Latest Units: ug/dL 130  TIBC Latest Ref Range: 250 - 450 ug/dL 174 (L)  Saturation Ratios Latest Ref Range: 10.4 - 31.8 % 25        HGb is not at goal (9--11) On Epogen  4) Secondary hyperparathyroidism -CKD Mineral-Bone Disorder    Lab Results  Component Value Date   CALCIUM 8.1 (L) 11/09/2020   PHOS 6.0 (H) 10/12/2020    Secondary Hyperparathyroidism present Phosphorus is not at goal.   5) shortness of breath Patient is most likely in fluid overload I did ask for chest x-ray Chest x-ray formal  report is pending but on review patient does have pulmonary edema We will start patient on IV Lasix We will start patient on p.o. Lasix after 1 IV dose We will try to remove fluid during dialysis tomorrow   6) Electrolytes   BMP Latest Ref Rng & Units 11/09/2020 11/08/2020 11/07/2020  Glucose 70 - 99 mg/dL 182(H) 223(H) 212(H)  BUN 6 - 20 mg/dL 46(H) 57(H) 74(H)  Creatinine 0.44 - 1.00 mg/dL 3.09(H) 4.05(H) 4.71(H)  Sodium 135 - 145 mmol/L 137 143 140  Potassium 3.5 - 5.1 mmol/L 4.2 4.3 4.7  Chloride 98 - 111 mmol/L 102 101 101  CO2 22 - 32 mmol/L 27 30 25   Calcium 8.9 - 10.3 mg/dL 8.1(L) 8.8(L) 9.0     Sodium Normonatremic   Potassium Normokalemic    7)Acid base  Co2 at goal  8) infection prevention We will ask for hepatitis profile We will ask for QuantiFERON   Plan:   Chest x-ray formal report is pending but on review patient does have pulmonary edema We will start patient on IV Lasix We will start patient on p.o. Lasix after 1 IV dose We will try to remove fluid during dialysis tomorrow     Terry Arroyo s Theador Hawthorne 11/09/2020, 7:32 AM

## 2020-11-09 NOTE — Progress Notes (Signed)
Physical Therapy Treatment Patient Details Name: Terry Arroyo MRN: [id number] DOB: 1961-02-16 Today's Date: 11/09/2020    History of Present Illness Terry Arroyo is a 59 y.o. female with medical history significant of dm ii, htn, cva, cad seen in ed for sob and le edema. Chart review has march echo which shows moderately decreased LV function with ef of 30%. She was recently discharged in October 2021 for similar presentation  and is returning to [place] for not feeling well with generalized complaints of chest pain sob and le edema..  I do not see  cardiology consult note pt, reports she does see cardiologist in addition to nephrologist as well. Patient lives alone in an apartment for seniors community. [facility] was started on her case last admission but pt was on wait list.    PT Comments    Pt ready for session.  Transfers on and off commode with supervision.  She is able to complete 2 laps with RW and supervision with 1 standing rest break today.  Generally steady.  92% sats on 3 lpm with rest and mobility.  Overall does well but fatigued with effort. Of note HgB 7.4 today and trending down but pt wanted to walk and tolerated well with no dizziness or symptoms besides fatigue.   Follow Up Recommendations  Home health PT     Equipment Recommendations  Rolling walker with 5" wheels    Recommendations for Other Services       Precautions / Restrictions Precautions Precautions: Fall Restrictions Weight Bearing Restrictions: No    Mobility  Bed Mobility Overal bed mobility: Modified Independent                Transfers Overall transfer level: Needs assistance Equipment used: Rolling walker (2 wheeled) Transfers: Sit to/from Stand Sit to Stand: Supervision            Ambulation/Gait Ambulation/Gait assistance: Supervision Gait Distance (Feet): 400 Feet Assistive device: Rolling walker (2 wheeled) Gait Pattern/deviations: Step-through pattern;Decreased step  length - right;Decreased step length - left Gait velocity: decreased   General Gait Details: Pt was easily able to ambulate 400 ft with 1 standing rest halfway   Stairs             Wheelchair Mobility    Modified Rankin (Stroke Patients Only)       Balance Overall balance assessment: Needs assistance Sitting-balance support: No upper extremity supported;Feet supported Sitting balance-Leahy Scale: Good Sitting balance - Comments: no LOB   Standing balance support: During functional activity;No upper extremity supported Standing balance-Leahy Scale: Fair Standing balance comment: Pt does have slight unsteadinesss however overall no LOB                             Cognition Arousal/Alertness: Awake/alert Behavior During Therapy: WFL for tasks assessed/performed Overall Cognitive Status: Within Functional Limits for tasks assessed                                 General Comments: pt is A and o X 4      Exercises      General Comments        Pertinent Vitals/Pain Pain Assessment: No/denies pain    Home Living                      Prior Function  PT Goals (current goals can now be found in the care plan section) Progress towards PT goals: Progressing toward goals    Frequency    Min 2X/week      PT Plan Current plan remains appropriate    Co-evaluation              AM-PAC PT "6 Clicks" Mobility   Outcome Measure  Help needed turning from your back to your side while in a flat bed without using bedrails?: None Help needed moving from lying on your back to sitting on the side of a flat bed without using bedrails?: None Help needed moving to and from a bed to a chair (including a wheelchair)?: A Little Help needed standing up from a chair using your arms (e.g., wheelchair or bedside chair)?: A Little Help needed to walk in hospital room?: A Little Help needed climbing 3-5 steps with a railing? : A  Little 6 Click Score: 20    End of Session Equipment Utilized During Treatment: Gait belt Activity Tolerance: Patient limited by fatigue Patient left: in chair;with call bell/phone within reach;with chair alarm set;with nursing/sitter in room Nurse Communication: Mobility status PT Visit Diagnosis: Muscle weakness (generalized) (M62.81);Unsteadiness on feet (R26.81)     Time: 5146-0479 PT Time Calculation (min) (ACUTE ONLY): 18 min  Charges:  $Gait Training: 8-22 mins                    Chesley Noon, PTA 11/09/20, 12:59 PM

## 2020-11-10 DIAGNOSIS — N049 Nephrotic syndrome with unspecified morphologic changes: Secondary | ICD-10-CM | POA: Diagnosis not present

## 2020-11-10 DIAGNOSIS — I5023 Acute on chronic systolic (congestive) heart failure: Secondary | ICD-10-CM | POA: Diagnosis not present

## 2020-11-10 DIAGNOSIS — N178 Other acute kidney failure: Secondary | ICD-10-CM | POA: Diagnosis not present

## 2020-11-10 DIAGNOSIS — N184 Chronic kidney disease, stage 4 (severe): Secondary | ICD-10-CM | POA: Diagnosis not present

## 2020-11-10 LAB — CBC WITH DIFFERENTIAL/PLATELET
Abs Immature Granulocytes: 0.05 10*3/uL (ref 0.00–0.07)
Basophils Absolute: 0 10*3/uL (ref 0.0–0.1)
Basophils Relative: 0 %
Eosinophils Absolute: 0.1 10*3/uL (ref 0.0–0.5)
Eosinophils Relative: 1 %
HCT: 22.6 % — ABNORMAL LOW (ref 36.0–46.0)
Hemoglobin: 7 g/dL — ABNORMAL LOW (ref 12.0–15.0)
Immature Granulocytes: 1 %
Lymphocytes Relative: 19 %
Lymphs Abs: 1.9 10*3/uL (ref 0.7–4.0)
MCH: 28.8 pg (ref 26.0–34.0)
MCHC: 31 g/dL (ref 30.0–36.0)
MCV: 93 fL (ref 80.0–100.0)
Monocytes Absolute: 0.8 10*3/uL (ref 0.1–1.0)
Monocytes Relative: 8 %
Neutro Abs: 7 10*3/uL (ref 1.7–7.7)
Neutrophils Relative %: 71 %
Platelets: 319 10*3/uL (ref 150–400)
RBC: 2.43 MIL/uL — ABNORMAL LOW (ref 3.87–5.11)
RDW: 14.7 % (ref 11.5–15.5)
WBC: 9.8 10*3/uL (ref 4.0–10.5)
nRBC: 0 % (ref 0.0–0.2)

## 2020-11-10 LAB — BASIC METABOLIC PANEL
Anion gap: 13 (ref 5–15)
BUN: 65 mg/dL — ABNORMAL HIGH (ref 6–20)
CO2: 27 mmol/L (ref 22–32)
Calcium: 8.3 mg/dL — ABNORMAL LOW (ref 8.9–10.3)
Chloride: 99 mmol/L (ref 98–111)
Creatinine, Ser: 4.2 mg/dL — ABNORMAL HIGH (ref 0.44–1.00)
GFR, Estimated: 12 mL/min — ABNORMAL LOW (ref >60)
Glucose, Bld: 183 mg/dL — ABNORMAL HIGH (ref 70–99)
Potassium: 3.8 mmol/L (ref 3.5–5.1)
Sodium: 139 mmol/L (ref 135–145)

## 2020-11-10 LAB — HEPATITIS B CORE ANTIBODY, TOTAL: Hep B Core Total Ab: NONREACTIVE

## 2020-11-10 LAB — GLUCOSE, CAPILLARY
Glucose-Capillary: 122 mg/dL — ABNORMAL HIGH (ref 70–99)
Glucose-Capillary: 92 mg/dL (ref 70–99)
Glucose-Capillary: 110 mg/dL — ABNORMAL HIGH (ref 70–99)

## 2020-11-10 LAB — HEPATITIS B SURFACE ANTIBODY,QUALITATIVE: Hep B S Ab: REACTIVE — AB

## 2020-11-10 LAB — HEPATITIS B SURFACE ANTIGEN: Hepatitis B Surface Ag: NONREACTIVE

## 2020-11-10 LAB — HEPATITIS C ANTIBODY: HCV Ab: NONREACTIVE

## 2020-11-10 LAB — PREPARE RBC (CROSSMATCH)

## 2020-11-10 MED ORDER — SODIUM CHLORIDE 0.9% IV SOLUTION: Freq: Once | INTRAVENOUS | Status: DC

## 2020-11-10 NOTE — Care Management Important Message (Signed)
Important Message  Patient Details  Name: Terry Arroyo MRN: [id number] Date of Birth: December 16, 1961   Medicare Important Message Given:  Yes     Dannette Barbara 11/10/2020, 12:25 PM

## 2020-11-10 NOTE — Progress Notes (Signed)
[facility]  ROUNDING NOTE   Subjective:  Patient planning to receive her third dialysis session today, tolerated two previous sessions very well.She reports feeling better after the initiation of dialysis.     Objective:  Vital signs in last 24 hours:  Temp:  [98.1 F (36.7 C)-99 F (37.2 C)] 98.1 F (36.7 C) (11/22 1202) Pulse Rate:  [63-70] 66 (11/22 1202) Resp:  [17-19] 18 (11/22 1202) BP: (127-157)/(72-84) 127/72 (11/22 1202) SpO2:  [96 %-100 %] 100 % (11/22 1202) Weight:  [81.3 kg] 81.3 kg (11/22 0342)  Weight change: -1.37 kg Filed Weights   11/08/20 0502 11/09/20 0413 11/10/20 0342  Weight: 82.9 kg 82.7 kg 81.3 kg    Intake/Output: I/O last 3 completed shifts: In: 1080 [P.O.:1080] Out: 800 [Urine:800]   Intake/Output this shift:  No intake/output data recorded.  Physical Exam: General:  In no acute distress  Head:  Oral mucous membranes moist  Eyes: Anicteric  Lungs:   Lungs clear, respiration even,unlabored,on 2L O2 via   Heart:  S1S2, no rubs or gallops  Abdomen:   Soft, nontender, nondistended  Extremities:  Trace peripheral edema.  Neurologic:  Awake,alert,oriented  Skin:  No acute lesions or rashes  Rt IJ Permcath  Basic Metabolic Panel: Recent Labs  Lab 11/04/20 0543 11/04/20 0543 11/05/20 6384 11/05/20 0548 11/06/20 0533 11/06/20 0533 11/07/20 6659 11/07/20 0538 11/08/20 0530 11/09/20 0502 11/10/20 0513  NA 140   < > 143   < > 143  --  140  --  143 137 139  K 3.7   < > 4.0   < > 4.8  --  4.7  --  4.3 4.2 3.8  CL 105   < > 106   < > 104  --  101  --  101 102 99  CO2 27   < > 26   < > 24  --  25  --  30 27 27   GLUCOSE 211*   < > 170*   < > 193*  --  212*  --  223* 182* 183*  BUN 58*   < > 70*   < > 71*  --  74*  --  57* 46* 65*  CREATININE 3.79*   < > 3.90*   < > 4.32*  --  4.71*  --  4.05* 3.09* 4.20*  CALCIUM 8.1*   < > 8.8*   < > 9.1   < > 9.0   < > 8.8* 8.1* 8.3*  MG 2.0  --  1.9  --  1.8  --  1.7  --  1.7  --   --     < > = values in this interval not displayed.    Liver Function Tests: Recent Labs  Lab 11/07/20 0538  AST 11*  ALT 12  ALKPHOS 58  BILITOT 0.7  PROT 6.4*  ALBUMIN 3.6   No results for input(s): LIPASE, AMYLASE in the last 168 hours. No results for input(s): AMMONIA in the last 168 hours.  CBC: Recent Labs  Lab 11/07/20 0538 11/08/20 0530 11/09/20 0502 11/10/20 0513  WBC 15.4* 10.9* 12.7* 9.8  NEUTROABS 11.7* 7.6 10.1* 7.0  HGB 7.9* 7.7* 7.4* 7.0*  HCT 24.8* 24.2* 23.3* 22.6*  MCV 91.9 92.0 90.7 93.0  PLT 351 372 322 319    Cardiac Enzymes: No results for input(s): CKTOTAL, CKMB, CKMBINDEX, TROPONINI in the last 168 hours.  BNP: Invalid input(s): POCBNP  CBG: Recent Labs  Lab 11/09/20 1137 11/09/20  1703 11/09/20 2139 11/10/20 0831 11/10/20 1204  GLUCAP 121* 115* 163* 122* 92    Microbiology: Results for orders placed or performed during the hospital encounter of 11/01/20  Respiratory Panel by RT PCR (Flu A&B, Covid) - Nasopharyngeal Swab     Status: None   Collection Time: 11/01/20  9:18 PM   Specimen: Nasopharyngeal Swab  Result Value Ref Range Status   SARS Coronavirus 2 by RT PCR NEGATIVE NEGATIVE Final    Comment: (NOTE) SARS-CoV-2 target nucleic acids are NOT DETECTED.  The SARS-CoV-2 RNA is generally detectable in upper respiratoy specimens during the acute phase of infection. The lowest concentration of SARS-CoV-2 viral copies this assay can detect is 131 copies/mL. A negative result does not preclude SARS-Cov-2 infection and should not be used as the sole basis for treatment or other patient management decisions. A negative result may occur with  improper specimen collection/handling, submission of specimen other than nasopharyngeal swab, presence of viral mutation(s) within the areas targeted by this assay, and inadequate number of viral copies (<131 copies/mL). A negative result must be combined with clinical observations, patient  history, and epidemiological information. The expected result is Negative.  Fact Sheet for Patients:  PinkCheek.be  Fact Sheet for Healthcare Providers:  GravelBags.it  This test is no t yet approved or cleared by the [place] FDA and  has been authorized for detection and/or diagnosis of SARS-CoV-2 by FDA under an Emergency Use Authorization (EUA). This EUA will remain  in effect (meaning this test can be used) for the duration of the COVID-19 declaration under Section 564(b)(1) of the Act, 21 U.S.C. section 360bbb-3(b)(1), unless the authorization is terminated or revoked sooner.     Influenza A by PCR NEGATIVE NEGATIVE Final   Influenza B by PCR NEGATIVE NEGATIVE Final    Comment: (NOTE) The Xpert Xpress SARS-CoV-2/FLU/RSV assay is intended as an aid in  the diagnosis of influenza from Nasopharyngeal swab specimens and  should not be used as a sole basis for treatment. Nasal washings and  aspirates are unacceptable for Xpert Xpress SARS-CoV-2/FLU/RSV  testing.  Fact Sheet for Patients: PinkCheek.be  Fact Sheet for Healthcare Providers: GravelBags.it  This test is not yet approved or cleared by the [place] FDA and  has been authorized for detection and/or diagnosis of SARS-CoV-2 by  FDA under an Emergency Use Authorization (EUA). This EUA will remain  in effect (meaning this test can be used) for the duration of the  Covid-19 declaration under Section 564(b)(1) of the Act, 21  U.S.C. section 360bbb-3(b)(1), unless the authorization is  terminated or revoked. Performed at [name], [address] [place], [place] [address]   MRSA PCR Screening     Status: None   Collection Time: 11/07/20  1:30 AM   Specimen: Nasal Mucosa; Nasopharyngeal  Result Value Ref Range Status   MRSA by PCR NEGATIVE NEGATIVE Final    Comment:        The  GeneXpert MRSA Assay (FDA approved for NASAL specimens only), is one component of a comprehensive MRSA colonization surveillance program. It is not intended to diagnose MRSA infection nor to guide or monitor treatment for MRSA infections. Performed at [facility], [address] [place], [place] [address]     Coagulation Studies: No results for input(s): LABPROT, INR in the last 72 hours.  Urinalysis: No results for input(s): COLORURINE, LABSPEC, PHURINE, GLUCOSEU, HGBUR, BILIRUBINUR, KETONESUR, PROTEINUR, UROBILINOGEN, NITRITE, LEUKOCYTESUR in the last 72 hours.  Invalid input(s): APPERANCEUR  Imaging: DG Chest 1 View  Result Date: 11/09/2020 CLINICAL DATA:  Acute tubular necrosis. EXAM: CHEST  1 VIEW COMPARISON:  11/01/2020 FINDINGS: Interval placement of right IJ central venous catheter with tip over the SVC. No pneumothorax. Lungs are adequately inflated with mild hazy prominence of the central pulmonary vessels likely mild vascular congestion. Minimal blunting of the costophrenic angles unchanged likely small amount of pleural fluid/pleuroparenchymal scarring. Mild stable cardiomegaly. Remainder of the exam is unchanged. IMPRESSION: Mild stable cardiomegaly with suggestion of minimal vascular congestion. Possible small amount bilateral pleural fluid/pleuroparenchymal scarring. Electronically Signed   By: Marin Olp M.D.   On: 11/09/2020 10:52     Medications:   . sodium chloride 250 mL (11/07/20 0522)   . sodium chloride   Intravenous Once  . amLODipine  10 mg Oral Daily  . aspirin EC  81 mg Oral Daily  . atorvastatin  80 mg Oral q1800  . carvedilol  25 mg Oral BID WC  . Chlorhexidine Gluconate Cloth  6 each Topical Q0600  . citalopram  10 mg Oral Daily  . famotidine  10 mg Oral Daily  . fenofibrate  160 mg Oral q1800  . furosemide  40 mg Oral BID  . heparin  5,000 Units Subcutaneous Q8H  . insulin aspart  0-20 Units Subcutaneous TID WC  . insulin  aspart  0-5 Units Subcutaneous QHS  . insulin aspart  4 Units Subcutaneous TID WC  . insulin glargine  30 Units Subcutaneous Daily  . isosorbide mononitrate  30 mg Oral Daily  . mirtazapine  30 mg Oral QHS  . mometasone-formoterol  2 puff Inhalation BID  . polyethylene glycol  17 g Oral Daily  . prednisoLONE acetate  1 drop Right Eye QID  . predniSONE  20 mg Oral Q breakfast  . senna-docusate  2 tablet Oral BID  . sodium chloride flush  3 mL Intravenous Q12H  . topiramate  25 mg Oral Daily  . cyanocobalamin  1,000 mcg Oral Daily   sodium chloride, acetaminophen, albuterol, bisacodyl, cyclobenzaprine, heparin, ipratropium-albuterol, LORazepam, ondansetron (ZOFRAN) IV, sodium chloride flush  Assessment/ Plan:  59 y.o. female with past medical history of congestive heart failure, diabetes mellitus type 2 with chronic kidney disease, hypertension, hyperlipidemia, chronic kidney disease stage IV with diabetic nephropathy and interstitial nephritis, history of brain tumor, left eye blindness who presented with increasing shortness of breath and lower extremity edema.   # Chronic kidney disease stage IV #proteinuria #mild interstitial nephritis.  #Bilateral lower extremity edema  Lab Results  Component Value Date   CREATININE 4.20 (H) 11/10/2020   CREATININE 3.09 (H) 11/09/2020   CREATININE 4.05 (H) 11/08/2020  Planning for 3rd dialysis session today Will continue assessing daily  #Anemia of chronic kidney disease.   Lab Results  Component Value Date   HGB 7.0 (L) 11/10/2020  [place] PRBC transfusion today  # Diabetes mellitus type 2 with chronic kidney disease Lab Results  Component Value Date   HGBA1C 8.6 (H) 10/11/2020   On Insulin Aspart and Insulin Glargine      LOS: 9 Kilah Drahos 11/22/20213:12 PM

## 2020-11-10 NOTE — Evaluation (Signed)
Occupational Therapy Evaluation Patient Details Name: Terry Arroyo MRN: [id number] DOB: 01-Jul-1961 Today's Date: 11/10/2020    History of Present Illness Terry Arroyo is a 59 y.o. female with medical history significant of dm ii, htn, cva, cad seen in ed for sob and le edema. Chart review has march echo which shows moderately decreased LV function with ef of 30%. She was recently discharged in October 2021 for similar presentation  and is returning to [place] for not feeling well with generalized complaints of chest pain sob and le edema..  I do not see  cardiology consult note pt, reports she does see cardiologist in addition to nephrologist as well. Patient lives alone in an apartment for seniors community. [facility] was started on her case last admission but pt was on wait list.   Clinical Impression   Pt seen for OT tx this date. Pt eager to participate. Ambulated to the bathroom with supervision/SBA and no DME, no LOB. Pt set up for seated sponge bath requiring supervision/CGA for safety when standing for LB/perineal washing. Pt able to doff/don socks without assist in sitting. VSS throughout session. Provided pt with 4oz of apple juice her pt's request at end of session after clearing with RN. Pt instructed in falls prevention and use of call bell for any OOB/out of chair activity after pt reporting she washed her clothes in the bathroom on her own. Chair alarm set, pt verbalized understanding. Pt continues to benefit from skilled OT Services to maximize safety and return to PLOF while minimizing falls risk and caregiver burden. Continue to recommend [facility] services and intermittent supervision.     Follow Up Recommendations  Home health OT;Supervision - Intermittent    Equipment Recommendations  None recommended by OT    Recommendations for Other Services       Precautions / Restrictions Precautions Precautions: Fall Restrictions Weight Bearing Restrictions: No      Mobility  Bed Mobility               General bed mobility comments: deferred, in recliner at start and end of session    Transfers Overall transfer level: Needs assistance Equipment used: None Transfers: Sit to/from Stand Sit to Stand: Supervision              Balance Overall balance assessment: Needs assistance Sitting-balance support: No upper extremity supported;Feet supported Sitting balance-Leahy Scale: Normal     Standing balance support: During functional activity;No upper extremity supported Standing balance-Leahy Scale: Fair                             ADL either performed or assessed with clinical judgement   ADL Overall ADL's : Needs assistance/impaired     Grooming: Wash/dry hands;Wash/dry face;Sitting;Set up;Supervision/safety   Upper Body Bathing: Supervision/ safety;Set up;Sitting   Lower Body Bathing: Sit to/from stand;Min guard;Set up   Upper Body Dressing : Sitting;Set up;Supervision/safety   Lower Body Dressing: Sitting/lateral leans;Supervision/safety   Toilet Transfer: Ambulation;Supervision/safety                   Vision Patient Visual Report: No change from baseline       Perception     Praxis      Pertinent Vitals/Pain Pain Assessment: No/denies pain     Hand Dominance     Extremity/Trunk Assessment             Communication     Cognition Arousal/Alertness: Awake/alert  Behavior During Therapy: WFL for tasks assessed/performed Overall Cognitive Status: Within Functional Limits for tasks assessed                                 General Comments: mild decreased safety - pt encouraged to use call bell for nursing assist for all mobility; pt verbalized understanding   General Comments       Exercises Other Exercises Other Exercises: Seated sponge bath with setup and supervision for safety, pt tolerated well   Shoulder Instructions      Home Living                                           Prior Functioning/Environment                   OT Problem List:        OT Treatment/Interventions:      OT Goals(Current goals can be found in the care plan section) Acute Rehab OT Goals Patient Stated Goal: to go home OT Goal Formulation: With patient Time For Goal Achievement: 11/17/20 Potential to Achieve Goals: Good  OT Frequency: Min 1X/week   Barriers to D/C:            Co-evaluation              AM-PAC OT "6 Clicks" Daily Activity     Outcome Measure Help from another person eating meals?: None Help from another person taking care of personal grooming?: None Help from another person toileting, which includes using toliet, bedpan, or urinal?: A Little Help from another person bathing (including washing, rinsing, drying)?: A Little Help from another person to put on and taking off regular upper body clothing?: None Help from another person to put on and taking off regular lower body clothing?: A Little 6 Click Score: 21   End of Session Nurse Communication: Mobility status  Activity Tolerance: Patient tolerated treatment well Patient left: in chair;with call bell/phone within reach;with chair alarm set                   Time: 1421-1448 OT Time Calculation (min): 27 min Charges:  OT General Charges $OT Visit: 1 Visit OT Treatments $Self Care/Home Management : 23-37 mins  Jeni Salles, MPH, MS, OTR/L ascom [phone] 11/10/20, 2:52 PM

## 2020-11-10 NOTE — Progress Notes (Signed)
PROGRESS NOTE    Terry Arroyo  XBD:[id number] DOB: 1961/11/05 DOA: 11/01/2020 PCP: Kirk Ruths, MD   Chief complaint.  Shortness of breath. Brief Narrative:  Patient is a 59 year old female with history oftype 2 diabetes, essential hypertension, history of CVA, coronary disease, chronic systolic congestive heart failure with ejection fraction 30-35%(03/21).She came to the hospital complaining increase short of breath, chest pain, and leg edema. She also has worsening renal function. She has been seen by nephrology and cardiology, she is started on IV Lasix drip. Patient renal function is getting worse, short of breath is not getting better. Permacath isplaced on 11/19and patient dialyzed.  Received PRBC transfusion for hemoglobin of 7.0 on 11/22.   Assessment & Plan:   Principal Problem:   Chest pain Active Problems:   DM2 (diabetes mellitus, type 2) (HCC)   Acute on chronic systolic CHF (congestive heart failure) (HCC)   Leg edema   Acute renal failure superimposed on stage 4 chronic kidney disease (HCC)   COPD exacerbation (HCC)   SOB (shortness of breath)   Nephrotic syndrome   Reactive thrombocytosis  #1.  Acute on chronic systolic congestive heart failure. Condition gradually improved after receiving hemodialysis. Patient short of breath is partially caused by anxiety depression.  Started as needed Ativan and citalopram.  2.  Anxiety depression. As above.  3. Acute kidney injury on chronic kidney disease stage IV/V. Nephrotic syndrome. Permacath placed, patient is on scheduled dialysis.  #4.  Type 2 diabetes with hyperglycemia. Continue current regimen.  5.  Anemia of chronic disease. Patient hemoglobin dropped down to 7.0.  Will transfuse 1 unit PRBC. No active bleeding, B12 and iron levels adequate.      DVT prophylaxis: Heparin Code Status: Full Family Communication: ,sister updated [phone] Disposition Plan:  .   Status is:  Inpatient  Remains inpatient appropriate because:Inpatient level of care appropriate due to severity of illness   Dispo: The patient is from: Home              Anticipated d/c is to: Home              Anticipated d/c date is: 3 days              Patient currently is not medically stable to d/c.        I/O last 3 completed shifts: In: 1080 [P.O.:1080] Out: 800 [Urine:800] No intake/output data recorded.     Consultants:   nephrology  Procedures: HD  Antimicrobials: none  Subjective: Patient still has short of breath with minimal exertion.  She is also having significant anxiety.  No cough. No abdominal pain or nausea vomiting.  No fever chills. She still making some urine.  No hematuria or dysuria. No additional chest pain or palpitation.  Objective: Vitals:   11/09/20 1702 11/09/20 1949 11/10/20 0342 11/10/20 0809  BP: (!) 148/84 (!) 157/74 (!) 149/79 (!) 156/84  Pulse: 69 70 63 64  Resp: 19 17 17 18   Temp: 99 F (37.2 C) 98.3 F (36.8 C) 98.2 F (36.8 C) 98.3 F (36.8 C)  TempSrc: Oral Oral Oral   SpO2: 100% 96% 98% 100%  Weight:   81.3 kg   Height:        Intake/Output Summary (Last 24 hours) at 11/10/2020 0902 Last data filed at 11/10/2020 0403 Gross per 24 hour  Intake 960 ml  Output 800 ml  Net 160 ml   Filed Weights   11/08/20 0502 11/09/20 0413 11/10/20 0342  Weight: 82.9 kg 82.7 kg 81.3 kg    Examination:  General exam: Appears calm and comfortable  Respiratory system: Clear to auscultation. Respiratory effort normal. Cardiovascular system: S1 & S2 heard, RRR. No JVD, murmurs, rubs, gallops or clicks. 1+ pedal edema. Gastrointestinal system: Abdomen is nondistended, soft and nontender. No organomegaly or masses felt. Normal bowel sounds heard. Central nervous system: Alert and oriented. No focal neurological deficits. Extremities: Symmetric  Skin: No rashes, lesions or ulcers Psychiatry: Judgement and insight appear normal. Mood &  affect appropriate.     Data Reviewed: I have personally reviewed following labs and imaging studies  CBC: Recent Labs  Lab 11/07/20 0538 11/08/20 0530 11/09/20 0502 11/10/20 0513  WBC 15.4* 10.9* 12.7* 9.8  NEUTROABS 11.7* 7.6 10.1* 7.0  HGB 7.9* 7.7* 7.4* 7.0*  HCT 24.8* 24.2* 23.3* 22.6*  MCV 91.9 92.0 90.7 93.0  PLT 351 372 322 403   Basic Metabolic Panel: Recent Labs  Lab 11/04/20 0543 11/04/20 0543 11/05/20 0548 11/05/20 0548 11/06/20 0533 11/07/20 0538 11/08/20 0530 11/09/20 0502 11/10/20 0513  NA 140   < > 143   < > 143 140 143 137 139  K 3.7   < > 4.0   < > 4.8 4.7 4.3 4.2 3.8  CL 105   < > 106   < > 104 101 101 102 99  CO2 27   < > 26   < > 24 25 30 27 27   GLUCOSE 211*   < > 170*   < > 193* 212* 223* 182* 183*  BUN 58*   < > 70*   < > 71* 74* 57* 46* 65*  CREATININE 3.79*   < > 3.90*   < > 4.32* 4.71* 4.05* 3.09* 4.20*  CALCIUM 8.1*   < > 8.8*   < > 9.1 9.0 8.8* 8.1* 8.3*  MG 2.0  --  1.9  --  1.8 1.7 1.7  --   --    < > = values in this interval not displayed.   GFR: Estimated Creatinine Clearance: 15.7 mL/min (A) (by C-G formula based on SCr of 4.2 mg/dL (H)). Liver Function Tests: Recent Labs  Lab 11/07/20 0538  AST 11*  ALT 12  ALKPHOS 58  BILITOT 0.7  PROT 6.4*  ALBUMIN 3.6   No results for input(s): LIPASE, AMYLASE in the last 168 hours. No results for input(s): AMMONIA in the last 168 hours. Coagulation Profile: No results for input(s): INR, PROTIME in the last 168 hours. Cardiac Enzymes: No results for input(s): CKTOTAL, CKMB, CKMBINDEX, TROPONINI in the last 168 hours. BNP (last 3 results) No results for input(s): PROBNP in the last 8760 hours. HbA1C: No results for input(s): HGBA1C in the last 72 hours. CBG: Recent Labs  Lab 11/09/20 0731 11/09/20 1137 11/09/20 1703 11/09/20 2139 11/10/20 0831  GLUCAP 155* 121* 115* 163* 122*   Lipid Profile: No results for input(s): CHOL, HDL, LDLCALC, TRIG, CHOLHDL, LDLDIRECT in the  last 72 hours. Thyroid Function Tests: No results for input(s): TSH, T4TOTAL, FREET4, T3FREE, THYROIDAB in the last 72 hours. Anemia Panel: Recent Labs    11/07/20 1453  VITAMINB12 388  TIBC 174*  IRON 44   Sepsis Labs: No results for input(s): PROCALCITON, LATICACIDVEN in the last 168 hours.  Recent Results (from the past 240 hour(s))  Respiratory Panel by RT PCR (Flu A&B, Covid) - Nasopharyngeal Swab     Status: None   Collection Time: 11/01/20  9:18 PM   Specimen:  Nasopharyngeal Swab  Result Value Ref Range Status   SARS Coronavirus 2 by RT PCR NEGATIVE NEGATIVE Final    Comment: (NOTE) SARS-CoV-2 target nucleic acids are NOT DETECTED.  The SARS-CoV-2 RNA is generally detectable in upper respiratoy specimens during the acute phase of infection. The lowest concentration of SARS-CoV-2 viral copies this assay can detect is 131 copies/mL. A negative result does not preclude SARS-Cov-2 infection and should not be used as the sole basis for treatment or other patient management decisions. A negative result may occur with  improper specimen collection/handling, submission of specimen other than nasopharyngeal swab, presence of viral mutation(s) within the areas targeted by this assay, and inadequate number of viral copies (<131 copies/mL). A negative result must be combined with clinical observations, patient history, and epidemiological information. The expected result is Negative.  Fact Sheet for Patients:  PinkCheek.be  Fact Sheet for Healthcare Providers:  GravelBags.it  This test is no t yet approved or cleared by the [place] FDA and  has been authorized for detection and/or diagnosis of SARS-CoV-2 by FDA under an Emergency Use Authorization (EUA). This EUA will remain  in effect (meaning this test can be used) for the duration of the COVID-19 declaration under Section 564(b)(1) of the Act, 21  U.S.C. section 360bbb-3(b)(1), unless the authorization is terminated or revoked sooner.     Influenza A by PCR NEGATIVE NEGATIVE Final   Influenza B by PCR NEGATIVE NEGATIVE Final    Comment: (NOTE) The Xpert Xpress SARS-CoV-2/FLU/RSV assay is intended as an aid in  the diagnosis of influenza from Nasopharyngeal swab specimens and  should not be used as a sole basis for treatment. Nasal washings and  aspirates are unacceptable for Xpert Xpress SARS-CoV-2/FLU/RSV  testing.  Fact Sheet for Patients: PinkCheek.be  Fact Sheet for Healthcare Providers: GravelBags.it  This test is not yet approved or cleared by the [place] FDA and  has been authorized for detection and/or diagnosis of SARS-CoV-2 by  FDA under an Emergency Use Authorization (EUA). This EUA will remain  in effect (meaning this test can be used) for the duration of the  Covid-19 declaration under Section 564(b)(1) of the Act, 21  U.S.C. section 360bbb-3(b)(1), unless the authorization is  terminated or revoked. Performed at [facility], [address] [place], [place] [address]   MRSA PCR Screening     Status: None   Collection Time: 11/07/20  1:30 AM   Specimen: Nasal Mucosa; Nasopharyngeal  Result Value Ref Range Status   MRSA by PCR NEGATIVE NEGATIVE Final    Comment:        The GeneXpert MRSA Assay (FDA approved for NASAL specimens only), is one component of a comprehensive MRSA colonization surveillance program. It is not intended to diagnose MRSA infection nor to guide or monitor treatment for MRSA infections. Performed at [facility], [address] [place], [place] [address]          Radiology Studies: DG Chest 1 View  Result Date: 11/09/2020 CLINICAL DATA:  Acute tubular necrosis. EXAM: CHEST  1 VIEW COMPARISON:  11/01/2020 FINDINGS: Interval placement of right IJ central venous catheter with tip over the  SVC. No pneumothorax. Lungs are adequately inflated with mild hazy prominence of the central pulmonary vessels likely mild vascular congestion. Minimal blunting of the costophrenic angles unchanged likely small amount of pleural fluid/pleuroparenchymal scarring. Mild stable cardiomegaly. Remainder of the exam is unchanged. IMPRESSION: Mild stable cardiomegaly with suggestion of minimal vascular congestion. Possible small amount  bilateral pleural fluid/pleuroparenchymal scarring. Electronically Signed   By: Marin Olp M.D.   On: 11/09/2020 10:52        Scheduled Meds: . sodium chloride   Intravenous Once  . amLODipine  10 mg Oral Daily  . aspirin EC  81 mg Oral Daily  . atorvastatin  80 mg Oral q1800  . carvedilol  25 mg Oral BID WC  . Chlorhexidine Gluconate Cloth  6 each Topical Q0600  . citalopram  10 mg Oral Daily  . famotidine  10 mg Oral Daily  . fenofibrate  160 mg Oral q1800  . furosemide  40 mg Oral BID  . heparin  5,000 Units Subcutaneous Q8H  . insulin aspart  0-20 Units Subcutaneous TID WC  . insulin aspart  0-5 Units Subcutaneous QHS  . insulin aspart  4 Units Subcutaneous TID WC  . insulin glargine  30 Units Subcutaneous Daily  . isosorbide mononitrate  30 mg Oral Daily  . mirtazapine  30 mg Oral QHS  . mometasone-formoterol  2 puff Inhalation BID  . polyethylene glycol  17 g Oral Daily  . prednisoLONE acetate  1 drop Right Eye QID  . predniSONE  20 mg Oral Q breakfast  . senna-docusate  2 tablet Oral BID  . sodium chloride flush  3 mL Intravenous Q12H  . topiramate  25 mg Oral Daily  . cyanocobalamin  1,000 mcg Oral Daily   Continuous Infusions: . sodium chloride 250 mL (11/07/20 0522)     LOS: 9 days    Time spent: 27 minutes    Sharen Hones, MD [facility]   To contact the attending provider between 7A-7P or the covering provider during after hours 7P-7A, please log into the web site [email or web address] and access using universal [place]  password for that web site. If you do not have the password, please call the hospital operator.  11/10/2020, 9:02 AM

## 2020-11-10 NOTE — Progress Notes (Signed)
Mobility Specialist - Progress Note   11/10/20 1143  Mobility  Activity Ambulated in room;Ambulated in hall  Level of Assistance Modified independent, requires aide device or extra time (CGA for safety)  Assistive Device Front wheel walker  Distance Ambulated (ft) 180 ft  Mobility Response Tolerated well  Mobility performed by Mobility specialist  $Mobility charge 1 Mobility    Pre-mobility: 70 HR, 99% SpO2 During mobility: 73 HR, 94% SpO2 Post-mobility: 70 HR, 95% SpO2   Pt laying in bed upon arrival. Pt initially refused OOB mobility d/t "feeling tired". However, she agrees to ambulate after washing her face w/ a washcloth. Pt able to get to EOB independently. Pt S2S to RW independently. Pt ambulated 180' total in room and hallway mod. Independently. CGA utilized for safety. No LOB noted. No c/o pain. Slight SOB at the end of session. O2 sat > 93% t/o session. Pt on RA. Pt refused further ambulation d/t her sister coming to visit for a little during session. Overall, pt tolerated session well. Pt left sitting on recliner w/ all needs placed in reach.     Bevely Hackbart Mobility Specialist  11/10/20, 11:50 AM

## 2020-11-11 DIAGNOSIS — N178 Other acute kidney failure: Secondary | ICD-10-CM | POA: Diagnosis not present

## 2020-11-11 DIAGNOSIS — N184 Chronic kidney disease, stage 4 (severe): Secondary | ICD-10-CM | POA: Diagnosis not present

## 2020-11-11 DIAGNOSIS — I5023 Acute on chronic systolic (congestive) heart failure: Secondary | ICD-10-CM | POA: Diagnosis not present

## 2020-11-11 LAB — TYPE AND SCREEN
ABO/RH(D): AB POS
Antibody Screen: NEGATIVE
Unit division: 0

## 2020-11-11 LAB — CBC WITH DIFFERENTIAL/PLATELET
Abs Immature Granulocytes: 0.04 10*3/uL (ref 0.00–0.07)
Basophils Absolute: 0.1 10*3/uL (ref 0.0–0.1)
Basophils Relative: 1 %
Eosinophils Absolute: 0.1 10*3/uL (ref 0.0–0.5)
Eosinophils Relative: 1 %
HCT: 27.5 % — ABNORMAL LOW (ref 36.0–46.0)
Hemoglobin: 9.1 g/dL — ABNORMAL LOW (ref 12.0–15.0)
Immature Granulocytes: 0 %
Lymphocytes Relative: 21 %
Lymphs Abs: 2.4 10*3/uL (ref 0.7–4.0)
MCH: 29.7 pg (ref 26.0–34.0)
MCHC: 33.1 g/dL (ref 30.0–36.0)
MCV: 89.9 fL (ref 80.0–100.0)
Monocytes Absolute: 1.1 10*3/uL — ABNORMAL HIGH (ref 0.1–1.0)
Monocytes Relative: 9 %
Neutro Abs: 7.8 10*3/uL — ABNORMAL HIGH (ref 1.7–7.7)
Neutrophils Relative %: 68 %
Platelets: 319 10*3/uL (ref 150–400)
RBC: 3.06 MIL/uL — ABNORMAL LOW (ref 3.87–5.11)
RDW: 14.3 % (ref 11.5–15.5)
WBC: 11.5 10*3/uL — ABNORMAL HIGH (ref 4.0–10.5)
nRBC: 0 % (ref 0.0–0.2)

## 2020-11-11 LAB — BPAM RBC
Blood Product Expiration Date: 202112202359
ISSUE DATE / TIME: 202111221651
Unit Type and Rh: 6200

## 2020-11-11 LAB — GLUCOSE, CAPILLARY
Glucose-Capillary: 136 mg/dL — ABNORMAL HIGH (ref 70–99)
Glucose-Capillary: 100 mg/dL — ABNORMAL HIGH (ref 70–99)
Glucose-Capillary: 91 mg/dL (ref 70–99)
Glucose-Capillary: 88 mg/dL (ref 70–99)

## 2020-11-11 LAB — BASIC METABOLIC PANEL
Anion gap: 12 (ref 5–15)
BUN: 33 mg/dL — ABNORMAL HIGH (ref 6–20)
CO2: 30 mmol/L (ref 22–32)
Calcium: 8.4 mg/dL — ABNORMAL LOW (ref 8.9–10.3)
Chloride: 97 mmol/L — ABNORMAL LOW (ref 98–111)
Creatinine, Ser: 2.73 mg/dL — ABNORMAL HIGH (ref 0.44–1.00)
GFR, Estimated: 20 mL/min — ABNORMAL LOW (ref >60)
Glucose, Bld: 157 mg/dL — ABNORMAL HIGH (ref 70–99)
Potassium: 3.7 mmol/L (ref 3.5–5.1)
Sodium: 139 mmol/L (ref 135–145)

## 2020-11-11 NOTE — Progress Notes (Signed)
[facility]  ROUNDING NOTE   Subjective:  Patient tolerated her first three dialysis sessions without problems.She reports feeling better, still has dyspnea on exertion, requiring supplemental O2 as needed.     Objective:  Vital signs in last 24 hours:  Temp:  [98 F (36.7 C)-98.6 F (37 C)] 98.4 F (36.9 C) (11/23 1153) Pulse Rate:  [62-71] 66 (11/23 1153) Resp:  [13-26] 15 (11/23 1315) BP: (125-182)/(72-90) 125/72 (11/23 1153) SpO2:  [98 %-100 %] 98 % (11/23 1153) Weight:  [79.9 kg] 79.9 kg (11/23 0500)  Weight change: -1.452 kg Filed Weights   11/09/20 0413 11/10/20 0342 11/11/20 0500  Weight: 82.7 kg 81.3 kg 79.9 kg    Intake/Output: I/O last 3 completed shifts: In: 360 [P.O.:360] Out: 2327 [Urine:1100; Other:1227]   Intake/Output this shift:  Total I/O In: 600 [P.O.:600] Out: -   Physical Exam: General:  In no acute distress  Head:  Oral mucous membranes moist  Eyes: Sclerae and conjunctivae clear  Lungs:   Respirations symmetrical,unlabored  Heart:  S1S2, no rubs or gallops  Abdomen:   Soft, nontender, nondistended  Extremities:  No peripheral edema.  Neurologic:  Oriented x3, speech clear and appropriate  Skin:  No acute lesions or rashes  Rt IJ Permcath  Basic Metabolic Panel: Recent Labs  Lab 11/05/20 0548 11/05/20 0548 11/06/20 0533 11/06/20 0533 11/07/20 0538 11/07/20 0538 11/08/20 0530 11/08/20 0530 11/09/20 0502 11/10/20 0513 11/11/20 0543  NA 143   < > 143   < > 140  --  143  --  137 139 139  K 4.0   < > 4.8   < > 4.7  --  4.3  --  4.2 3.8 3.7  CL 106   < > 104   < > 101  --  101  --  102 99 97*  CO2 26   < > 24   < > 25  --  30  --  27 27 30   GLUCOSE 170*   < > 193*   < > 212*  --  223*  --  182* 183* 157*  BUN 70*   < > 71*   < > 74*  --  57*  --  46* 65* 33*  CREATININE 3.90*   < > 4.32*   < > 4.71*  --  4.05*  --  3.09* 4.20* 2.73*  CALCIUM 8.8*   < > 9.1   < > 9.0   < > 8.8*   < > 8.1* 8.3* 8.4*  MG 1.9  --  1.8   --  1.7  --  1.7  --   --   --   --    < > = values in this interval not displayed.    Liver Function Tests: Recent Labs  Lab 11/07/20 0538  AST 11*  ALT 12  ALKPHOS 58  BILITOT 0.7  PROT 6.4*  ALBUMIN 3.6   No results for input(s): LIPASE, AMYLASE in the last 168 hours. No results for input(s): AMMONIA in the last 168 hours.  CBC: Recent Labs  Lab 11/07/20 0538 11/08/20 0530 11/09/20 0502 11/10/20 0513 11/11/20 0543  WBC 15.4* 10.9* 12.7* 9.8 11.5*  NEUTROABS 11.7* 7.6 10.1* 7.0 7.8*  HGB 7.9* 7.7* 7.4* 7.0* 9.1*  HCT 24.8* 24.2* 23.3* 22.6* 27.5*  MCV 91.9 92.0 90.7 93.0 89.9  PLT 351 372 322 319 319    Cardiac Enzymes: No results for input(s): CKTOTAL, CKMB, CKMBINDEX, TROPONINI in the last  168 hours.  BNP: Invalid input(s): POCBNP  CBG: Recent Labs  Lab 11/10/20 0831 11/10/20 1204 11/10/20 2021 11/11/20 0811 11/11/20 1153  GLUCAP 122* 92 110* 136* 100*    Microbiology: Results for orders placed or performed during the hospital encounter of 11/01/20  Respiratory Panel by RT PCR (Flu A&B, Covid) - Nasopharyngeal Swab     Status: None   Collection Time: 11/01/20  9:18 PM   Specimen: Nasopharyngeal Swab  Result Value Ref Range Status   SARS Coronavirus 2 by RT PCR NEGATIVE NEGATIVE Final    Comment: (NOTE) SARS-CoV-2 target nucleic acids are NOT DETECTED.  The SARS-CoV-2 RNA is generally detectable in upper respiratoy specimens during the acute phase of infection. The lowest concentration of SARS-CoV-2 viral copies this assay can detect is 131 copies/mL. A negative result does not preclude SARS-Cov-2 infection and should not be used as the sole basis for treatment or other patient management decisions. A negative result may occur with  improper specimen collection/handling, submission of specimen other than nasopharyngeal swab, presence of viral mutation(s) within the areas targeted by this assay, and inadequate number of viral copies (<131  copies/mL). A negative result must be combined with clinical observations, patient history, and epidemiological information. The expected result is Negative.  Fact Sheet for Patients:  PinkCheek.be  Fact Sheet for Healthcare Providers:  GravelBags.it  This test is no t yet approved or cleared by the [place] FDA and  has been authorized for detection and/or diagnosis of SARS-CoV-2 by FDA under an Emergency Use Authorization (EUA). This EUA will remain  in effect (meaning this test can be used) for the duration of the COVID-19 declaration under Section 564(b)(1) of the Act, 21 U.S.C. section 360bbb-3(b)(1), unless the authorization is terminated or revoked sooner.     Influenza A by PCR NEGATIVE NEGATIVE Final   Influenza B by PCR NEGATIVE NEGATIVE Final    Comment: (NOTE) The Xpert Xpress SARS-CoV-2/FLU/RSV assay is intended as an aid in  the diagnosis of influenza from Nasopharyngeal swab specimens and  should not be used as a sole basis for treatment. Nasal washings and  aspirates are unacceptable for Xpert Xpress SARS-CoV-2/FLU/RSV  testing.  Fact Sheet for Patients: PinkCheek.be  Fact Sheet for Healthcare Providers: GravelBags.it  This test is not yet approved or cleared by the [place] FDA and  has been authorized for detection and/or diagnosis of SARS-CoV-2 by  FDA under an Emergency Use Authorization (EUA). This EUA will remain  in effect (meaning this test can be used) for the duration of the  Covid-19 declaration under Section 564(b)(1) of the Act, 21  U.S.C. section 360bbb-3(b)(1), unless the authorization is  terminated or revoked. Performed at [facility], [address] [place], [place] [address]   MRSA PCR Screening     Status: None   Collection Time: 11/07/20  1:30 AM   Specimen: Nasal Mucosa; Nasopharyngeal  Result Value  Ref Range Status   MRSA by PCR NEGATIVE NEGATIVE Final    Comment:        The GeneXpert MRSA Assay (FDA approved for NASAL specimens only), is one component of a comprehensive MRSA colonization surveillance program. It is not intended to diagnose MRSA infection nor to guide or monitor treatment for MRSA infections. Performed at [facility], [address] [place], [place] [address]     Coagulation Studies: No results for input(s): LABPROT, INR in the last 72 hours.  Urinalysis: No results for input(s): COLORURINE, LABSPEC, [place], [place],  HGBUR, BILIRUBINUR, KETONESUR, PROTEINUR, UROBILINOGEN, NITRITE, LEUKOCYTESUR in the last 72 hours.  Invalid input(s): APPERANCEUR    Imaging: No results found.   Medications:   . sodium chloride 250 mL (11/07/20 0522)   . sodium chloride   Intravenous Once  . amLODipine  10 mg Oral Daily  . aspirin EC  81 mg Oral Daily  . atorvastatin  80 mg Oral q1800  . carvedilol  25 mg Oral BID WC  . Chlorhexidine Gluconate Cloth  6 each Topical Q0600  . citalopram  10 mg Oral Daily  . famotidine  10 mg Oral Daily  . fenofibrate  160 mg Oral q1800  . furosemide  40 mg Oral BID  . heparin  5,000 Units Subcutaneous Q8H  . insulin aspart  0-20 Units Subcutaneous TID WC  . insulin aspart  0-5 Units Subcutaneous QHS  . insulin aspart  4 Units Subcutaneous TID WC  . insulin glargine  30 Units Subcutaneous Daily  . isosorbide mononitrate  30 mg Oral Daily  . mirtazapine  30 mg Oral QHS  . mometasone-formoterol  2 puff Inhalation BID  . polyethylene glycol  17 g Oral Daily  . prednisoLONE acetate  1 drop Right Eye QID  . predniSONE  20 mg Oral Q breakfast  . senna-docusate  2 tablet Oral BID  . sodium chloride flush  3 mL Intravenous Q12H  . topiramate  25 mg Oral Daily  . cyanocobalamin  1,000 mcg Oral Daily   sodium chloride, acetaminophen, albuterol, bisacodyl, cyclobenzaprine, ipratropium-albuterol, LORazepam, ondansetron  (ZOFRAN) IV, sodium chloride flush  Assessment/ Plan:  59 y.o. female with past medical history of congestive heart failure, diabetes mellitus type 2 with chronic kidney disease, hypertension, hyperlipidemia, chronic kidney disease stage IV with diabetic nephropathy and interstitial nephritis, history of brain tumor, left eye blindness who presented with increasing shortness of breath and lower extremity edema.   # Chronic kidney disease progressed to ESRD  #proteinuria #mild interstitial nephritis.  #Bilateral lower extremity edema  Patient received first three dialysis sessions, tolerated well  Volume and electrolyte status acceptable  No additional dialysis required today Will continue monitoring closely  #Anemia of chronic kidney disease.   Lab Results  Component Value Date   HGB 9.1 (L) 11/11/2020  Post PRBC transfusion yesterday  # Diabetes mellitus type 2 with chronic kidney disease Lab Results  Component Value Date   HGBA1C 8.6 (H) 10/11/2020  Blood glucose levels within acceptable range  On Insulin Aspart and Insulin Glargine      LOS: 10 Delorise Hunkele 11/23/20213:53 PM

## 2020-11-11 NOTE — Progress Notes (Signed)
Mobility Specialist - Progress Note   11/11/20 1700  Mobility  Activity Ambulated in hall  Level of [place] wheel walker  Distance Ambulated (ft) 180 ft  Mobility Response Tolerated well  Mobility performed by Mobility specialist  $Mobility charge 1 Mobility    Pre-mobility: 69 HR, 96% SpO2 During mobility: 71 HR, 95% SpO2 Post-mobility: 70 HR, 97% SpO2   Pt was sitting EOB upon arrival utilizing room air. Pt agreed to session. Pt denied any pain, nausea, or fatigue. Pt was independent in ambulation this date. Pt ambulated 180' in room/hallway with no LOB. Pt denied SOB, O2 > 94% throughout activity. Upon return to room, pt c/o feeling slightly tired. Overall, pt tolerated session well. Pt was left EOB with all needs in reach. Pt voiced concern about being d/c and not being able to participate in dialysis d/t center being closed for upcoming holiday. Nurse notified.    Kathee Delton Mobility Specialist 11/11/20, 5:19 PM

## 2020-11-11 NOTE — TOC Progression Note (Signed)
Transition of Care [facility]) - Progression Note    Patient Details  Name: Terry Arroyo MRN: [id number] Date of Birth: Apr 22, 1961  Transition of Care [facility] CM/SW Contact  [name], LCSW Phone Number: 11/11/2020, 12:02 PM  Clinical Narrative:    CSW spoke with [facility] coordinator about patient needing a scheduled time and place. Estill Bamberg was already aware and working to locate and secure. She will notify when completed.    Expected Discharge Plan: [place] Barriers to Discharge: Continued Medical Work up  Expected Discharge Plan and Services Expected Discharge Plan: [place] Choice: [place] arrangements for the past 2 months: Apartment                           HH Arranged: RN, Nurse's Aide, PT, OT HH Agency: [facility] Date [place]: 11/03/20 Time [place]: 3785 Representative spoke with at [place]: [place] ([place]) Interventions    Readmission Risk Interventions Readmission Risk Prevention Plan 11/03/2020 10/23/2020 10/12/2020  Transportation Screening Complete Complete Complete  Medication Review Press photographer) Complete Complete Complete  PCP or Specialist appointment within 3-5 days of discharge Not Complete Complete Complete  PCP/Specialist Appt Not Complete comments To be scheduled by unit secretary. - -  HRI or Home Care Consult Complete Complete -  SW Recovery Care/Counseling Consult Complete Complete Complete  Palliative Care Screening Not Applicable Not Applicable Not Applicable  Skilled Nursing Facility Patient Refused Not Applicable Not Complete  SNF Comments Patient refuses states she is able to take care of herself. - Patient pending evaluation  Some recent data might be hidden

## 2020-11-11 NOTE — Progress Notes (Signed)
PROGRESS NOTE    Terry Arroyo  ONG:[id number] DOB: Jun 26, 1961 DOA: 11/01/2020 PCP: Kirk Ruths, MD   Chief complaint.  Shortness of breath. Brief Narrative:  Patient is a 59 year old female with history oftype 2 diabetes, essential hypertension, history of CVA, coronary disease, chronic systolic congestive heart failure with ejection fraction 30-35%(03/21).She came to the hospital complaining increase short of breath, chest pain, and leg edema. She also has worsening renal function. She has been seen by nephrology and cardiology, she is started on IV Lasix drip. Patient renal function is getting worse, short of breath is not getting better. Permacath isplaced on 11/19and patient dialyzed.  Received PRBC transfusion for hemoglobin of 7.0 on 11/22.   Assessment & Plan:   Principal Problem:   Chest pain Active Problems:   DM2 (diabetes mellitus, type 2) (HCC)   Acute on chronic systolic CHF (congestive heart failure) (HCC)   Leg edema   Acute renal failure superimposed on stage 4 chronic kidney disease (HCC)   COPD exacerbation (HCC)   SOB (shortness of breath)   Nephrotic syndrome   Reactive thrombocytosis  #1. Acute on chronic systolic congestive heart failure. Condition finally improved after dialysis. Patient also has some anxiety, on as needed Ativan and a scheduled escitalopram.  #2.  Anxiety and depression. As above  3.  Pure acute kidney injury on chronic kidney disease stage IV/V. Nephrotic syndrome. Patient had a permacath placed, currently on scheduled dialysis. Can discharge when dialysis chair is set up.  4.  Type 2 diabetes with hyperglycemia. Continue current regimen.  5.  Anemia of chronic disease. Status post 1 unit PRBC.  Adequate B12 and iron level.    DVT prophylaxis: Heparin Code Status: full Family Communication: none Disposition Plan:  .   Status is: Inpatient  Remains inpatient appropriate because:Inpatient level of care  appropriate due to severity of illness   Dispo: The patient is from: Home              Anticipated d/c is to: Home              Anticipated d/c date is: 2 days              Patient currently is not medically stable to d/c.        I/O last 3 completed shifts: In: 360 [P.O.:360] Out: 2327 [Urine:1100; Other:1227] Total I/O In: 600 [P.O.:600] Out: -      Consultants:   Nephrology  Procedures: HD  Antimicrobials: None  Subjective: Patient finally feels better today.  No wheezing short of breath.  She was able to sleep flat overnight. Denies any cough. No chest pain or palpitation. No dysuria hematuria pain No fever or chills.  Objective: Vitals:   11/11/20 1230 11/11/20 1245 11/11/20 1300 11/11/20 1315  BP:      Pulse:      Resp: 20 17 20 15   Temp:      TempSrc:      SpO2:      Weight:      Height:        Intake/Output Summary (Last 24 hours) at 11/11/2020 1459 Last data filed at 11/11/2020 1345 Gross per 24 hour  Intake 600 ml  Output 2327 ml  Net -1727 ml   Filed Weights   11/09/20 0413 11/10/20 0342 11/11/20 0500  Weight: 82.7 kg 81.3 kg 79.9 kg    Examination:  General exam: Appears calm and comfortable  Respiratory system: Clear to auscultation. Respiratory effort normal.  Cardiovascular system: S1 & S2 heard, RRR. No JVD, murmurs, rubs, gallops or clicks. No pedal edema. Gastrointestinal system: Abdomen is nondistended, soft and nontender. No organomegaly or masses felt. Normal bowel sounds heard. Central nervous system: Alert and oriented. No focal neurological deficits. Extremities: Symmetric  Skin: No rashes, lesions or ulcers Psychiatry:  Mood & affect appropriate.     Data Reviewed: I have personally reviewed following labs and imaging studies  CBC: Recent Labs  Lab 11/07/20 0538 11/08/20 0530 11/09/20 0502 11/10/20 0513 11/11/20 0543  WBC 15.4* 10.9* 12.7* 9.8 11.5*  NEUTROABS 11.7* 7.6 10.1* 7.0 7.8*  HGB 7.9* 7.7* 7.4*  7.0* 9.1*  HCT 24.8* 24.2* 23.3* 22.6* 27.5*  MCV 91.9 92.0 90.7 93.0 89.9  PLT 351 372 322 319 762   Basic Metabolic Panel: Recent Labs  Lab 11/05/20 0548 11/05/20 0548 11/06/20 0533 11/06/20 0533 11/07/20 0538 11/08/20 0530 11/09/20 0502 11/10/20 0513 11/11/20 0543  NA 143   < > 143   < > 140 143 137 139 139  K 4.0   < > 4.8   < > 4.7 4.3 4.2 3.8 3.7  CL 106   < > 104   < > 101 101 102 99 97*  CO2 26   < > 24   < > 25 30 27 27 30   GLUCOSE 170*   < > 193*   < > 212* 223* 182* 183* 157*  BUN 70*   < > 71*   < > 74* 57* 46* 65* 33*  CREATININE 3.90*   < > 4.32*   < > 4.71* 4.05* 3.09* 4.20* 2.73*  CALCIUM 8.8*   < > 9.1   < > 9.0 8.8* 8.1* 8.3* 8.4*  MG 1.9  --  1.8  --  1.7 1.7  --   --   --    < > = values in this interval not displayed.   GFR: Estimated Creatinine Clearance: 23.9 mL/min (A) (by C-G formula based on SCr of 2.73 mg/dL (H)). Liver Function Tests: Recent Labs  Lab 11/07/20 0538  AST 11*  ALT 12  ALKPHOS 58  BILITOT 0.7  PROT 6.4*  ALBUMIN 3.6   No results for input(s): LIPASE, AMYLASE in the last 168 hours. No results for input(s): AMMONIA in the last 168 hours. Coagulation Profile: No results for input(s): INR, PROTIME in the last 168 hours. Cardiac Enzymes: No results for input(s): CKTOTAL, CKMB, CKMBINDEX, TROPONINI in the last 168 hours. BNP (last 3 results) No results for input(s): PROBNP in the last 8760 hours. HbA1C: No results for input(s): HGBA1C in the last 72 hours. CBG: Recent Labs  Lab 11/10/20 0831 11/10/20 1204 11/10/20 2021 11/11/20 0811 11/11/20 1153  GLUCAP 122* 92 110* 136* 100*   Lipid Profile: No results for input(s): CHOL, HDL, LDLCALC, TRIG, CHOLHDL, LDLDIRECT in the last 72 hours. Thyroid Function Tests: No results for input(s): TSH, T4TOTAL, FREET4, T3FREE, THYROIDAB in the last 72 hours. Anemia Panel: No results for input(s): VITAMINB12, FOLATE, FERRITIN, TIBC, IRON, RETICCTPCT in the last 72 hours. Sepsis  Labs: No results for input(s): PROCALCITON, LATICACIDVEN in the last 168 hours.  Recent Results (from the past 240 hour(s))  Respiratory Panel by RT PCR (Flu A&B, Covid) - Nasopharyngeal Swab     Status: None   Collection Time: 11/01/20  9:18 PM   Specimen: Nasopharyngeal Swab  Result Value Ref Range Status   SARS Coronavirus 2 by RT PCR NEGATIVE NEGATIVE Final    Comment: (NOTE)  SARS-CoV-2 target nucleic acids are NOT DETECTED.  The SARS-CoV-2 RNA is generally detectable in upper respiratoy specimens during the acute phase of infection. The lowest concentration of SARS-CoV-2 viral copies this assay can detect is 131 copies/mL. A negative result does not preclude SARS-Cov-2 infection and should not be used as the sole basis for treatment or other patient management decisions. A negative result may occur with  improper specimen collection/handling, submission of specimen other than nasopharyngeal swab, presence of viral mutation(s) within the areas targeted by this assay, and inadequate number of viral copies (<131 copies/mL). A negative result must be combined with clinical observations, patient history, and epidemiological information. The expected result is Negative.  Fact Sheet for Patients:  PinkCheek.be  Fact Sheet for Healthcare Providers:  GravelBags.it  This test is no t yet approved or cleared by the [place] FDA and  has been authorized for detection and/or diagnosis of SARS-CoV-2 by FDA under an Emergency Use Authorization (EUA). This EUA will remain  in effect (meaning this test can be used) for the duration of the COVID-19 declaration under Section 564(b)(1) of the Act, 21 U.S.C. section 360bbb-3(b)(1), unless the authorization is terminated or revoked sooner.     Influenza A by PCR NEGATIVE NEGATIVE Final   Influenza B by PCR NEGATIVE NEGATIVE Final    Comment: (NOTE) The Xpert Xpress  SARS-CoV-2/FLU/RSV assay is intended as an aid in  the diagnosis of influenza from Nasopharyngeal swab specimens and  should not be used as a sole basis for treatment. Nasal washings and  aspirates are unacceptable for Xpert Xpress SARS-CoV-2/FLU/RSV  testing.  Fact Sheet for Patients: PinkCheek.be  Fact Sheet for Healthcare Providers: GravelBags.it  This test is not yet approved or cleared by the [place] FDA and  has been authorized for detection and/or diagnosis of SARS-CoV-2 by  FDA under an Emergency Use Authorization (EUA). This EUA will remain  in effect (meaning this test can be used) for the duration of the  Covid-19 declaration under Section 564(b)(1) of the Act, 21  U.S.C. section 360bbb-3(b)(1), unless the authorization is  terminated or revoked. Performed at [facility], [address] [place], [place] [address]   MRSA PCR Screening     Status: None   Collection Time: 11/07/20  1:30 AM   Specimen: Nasal Mucosa; Nasopharyngeal  Result Value Ref Range Status   MRSA by PCR NEGATIVE NEGATIVE Final    Comment:        The GeneXpert MRSA Assay (FDA approved for NASAL specimens only), is one component of a comprehensive MRSA colonization surveillance program. It is not intended to diagnose MRSA infection nor to guide or monitor treatment for MRSA infections. Performed at [facility], [address] [place], [place] [address]          Radiology Studies: No results found.      Scheduled Meds: . sodium chloride   Intravenous Once  . amLODipine  10 mg Oral Daily  . aspirin EC  81 mg Oral Daily  . atorvastatin  80 mg Oral q1800  . carvedilol  25 mg Oral BID WC  . Chlorhexidine Gluconate Cloth  6 each Topical Q0600  . citalopram  10 mg Oral Daily  . famotidine  10 mg Oral Daily  . fenofibrate  160 mg Oral q1800  . furosemide  40 mg Oral BID  . heparin  5,000 Units  Subcutaneous Q8H  . insulin aspart  0-20 Units Subcutaneous TID WC  . insulin aspart  0-5 Units Subcutaneous QHS  . insulin aspart  4 Units Subcutaneous TID WC  . insulin glargine  30 Units Subcutaneous Daily  . isosorbide mononitrate  30 mg Oral Daily  . mirtazapine  30 mg Oral QHS  . mometasone-formoterol  2 puff Inhalation BID  . polyethylene glycol  17 g Oral Daily  . prednisoLONE acetate  1 drop Right Eye QID  . predniSONE  20 mg Oral Q breakfast  . senna-docusate  2 tablet Oral BID  . sodium chloride flush  3 mL Intravenous Q12H  . topiramate  25 mg Oral Daily  . cyanocobalamin  1,000 mcg Oral Daily   Continuous Infusions: . sodium chloride 250 mL (11/07/20 0522)     LOS: 10 days    Time spent: 27 minutes    Sharen Hones, MD [facility]   To contact the attending provider between 7A-7P or the covering provider during after hours 7P-7A, please log into the web site [email or web address] and access using universal [place] password for that web site. If you do not have the password, please call the hospital operator.  11/11/2020, 2:59 PM

## 2020-11-12 DIAGNOSIS — N186 End stage renal disease: Secondary | ICD-10-CM

## 2020-11-12 DIAGNOSIS — Z992 Dependence on renal dialysis: Secondary | ICD-10-CM

## 2020-11-12 DIAGNOSIS — J441 Chronic obstructive pulmonary disease with (acute) exacerbation: Secondary | ICD-10-CM

## 2020-11-12 LAB — QUANTIFERON-TB GOLD PLUS (RQFGPL)
QuantiFERON Mitogen Value: 2.48 IU/mL
QuantiFERON Nil Value: 0 IU/mL
QuantiFERON TB1 Ag Value: 0 IU/mL
QuantiFERON TB2 Ag Value: 0 IU/mL

## 2020-11-12 LAB — PHOSPHORUS: Phosphorus: 3.4 mg/dL (ref 2.5–4.6)

## 2020-11-12 LAB — GLUCOSE, CAPILLARY
Glucose-Capillary: 87 mg/dL (ref 70–99)
Glucose-Capillary: 109 mg/dL — ABNORMAL HIGH (ref 70–99)
Glucose-Capillary: 110 mg/dL — ABNORMAL HIGH (ref 70–99)
Glucose-Capillary: 271 mg/dL — ABNORMAL HIGH (ref 70–99)

## 2020-11-12 LAB — QUANTIFERON-TB GOLD PLUS: QuantiFERON-TB Gold Plus: NEGATIVE

## 2020-11-12 MED ORDER — INSULIN ASPART PROT & ASPART (70-30 MIX) 100 UNIT/ML ~~LOC~~ SUSP: 15.0000 [IU] | Freq: Two times a day (BID) | SUBCUTANEOUS | 0 refills | Status: DC

## 2020-11-12 NOTE — Progress Notes (Addendum)
Pt return from dialysis, c/o headache, BP 176/85, scheduled meds and PRN tylenol administered. MD made aware, no new orders. Will continue to monitor.    UPDATE: pt states she feels better since eating and taking meds. BP recheck 136/78. Pt eager to discharge. MD made aware/ok to discharge.

## 2020-11-12 NOTE — Progress Notes (Signed)
Discharge instructions explained/pt verbalized understanding. IV and tele removed. [employer] called for transport. Will transport off unit via wheelchair when they arrive.

## 2020-11-12 NOTE — Progress Notes (Signed)
Patient clinically accepted at [facility] MWF 11:45am. Patient can start on Monday 11/29 at 11:15am. Patient stated that she rides with [facility] to medical appointments already, so they will need to be made aware of the place and time. [facility] aware. Please contact me with any dialysis placement concerns.  Elvera Bicker Dialysis Coordinator [phone]

## 2020-11-12 NOTE — Progress Notes (Signed)
Mobility Specialist - Progress Note   11/12/20 1600  Mobility  Activity Dangled on edge of bed  Level of Assistance Standby assist, set-up cues, supervision of patient - no hands on  Assistive Device None  Distance Ambulated (ft) 0 ft  Mobility Response Tolerated well  Mobility performed by Mobility specialist  $Mobility charge 1 Mobility    Pt lying in bed upon arrival utilizing room air. Pt denied any pain or nausea, but did c/o fatigue d/t HD treatment. Pt dangled EOB for 5 mins with no c/o dizziness. Pt presents with fair sitting balance. Overall, pt tolerated session well. Pt left in bed with all needs in reach, anticipating d/c.    Kathee Delton Mobility Specialist 11/12/20, 4:22 PM

## 2020-11-12 NOTE — Progress Notes (Signed)
Mobility Specialist - Progress Note   11/12/20 1200  Mobility  Activity Contraindicated/medical hold  Mobility performed by Mobility specialist    Pt currently undergoing dialysis at this time. Will attempt session at another date/time when pt becomes available.    Kathee Delton Mobility Specialist 11/12/20, 12:02 PM

## 2020-11-12 NOTE — Care Management Important Message (Signed)
Important Message  Patient Details  Name: Terry Arroyo MRN: [id number] Date of Birth: 1961-09-30   Medicare Important Message Given:  Yes     Dannette Barbara 11/12/2020, 11:44 AM

## 2020-11-12 NOTE — Discharge Instructions (Signed)
Keep log of his sugars at home  Carb control renal diet with 1200 mL fluid restriction  Start your dialysis at [facility] on November 29 at 11:45 AM at  [facility]

## 2020-11-12 NOTE — Progress Notes (Signed)
[facility]  ROUNDING NOTE   Subjective:  Patient seen in dialysis today, tolerating treatment well.  Primary team planning for discharge later today, outpatient dialysis arrangements were done starting on Monday.     Objective:  Vital signs in last 24 hours:  Temp:  [98.2 F (36.8 C)-98.5 F (36.9 C)] 98.5 F (36.9 C) (11/24 1419) Pulse Rate:  [56-68] 61 (11/24 1519) Resp:  [14-21] 18 (11/24 1419) BP: (136-176)/(62-85) 136/78 (11/24 1519) SpO2:  [99 %-100 %] 100 % (11/24 1419) Weight:  [80.6 kg] 80.6 kg (11/24 0411)  Weight change: 0.68 kg Filed Weights   11/10/20 0342 11/11/20 0500 11/12/20 0411  Weight: 81.3 kg 79.9 kg 80.6 kg    Intake/Output: I/O last 3 completed shifts: In: 840 [P.O.:840] Out: 3977 [Urine:2750; Other:1227]   Intake/Output this shift:  Total I/O In: 120 [P.O.:120] Out: 2600 [Urine:600; Other:2000]  Physical Exam: General:  Resting in bed, in no acute distress, receiving dialysis treatment  Head:  Sclera and conjunctivae clear  Eyes:  Anicteric  Lungs:   Respirations symmetrical,unlabored, lungs clear  Heart:  Regular  Abdomen:   Soft, nontender, nondistended  Extremities:  No peripheral edema.  Neurologic:  Awake, alert, oriented  Skin:  No acute lesions or rashes  Rt IJ Permcath  Basic Metabolic Panel: Recent Labs  Lab 11/06/20 0533 11/06/20 0533 11/07/20 0538 11/07/20 0538 11/08/20 0530 11/08/20 0530 11/09/20 0502 11/10/20 0513 11/11/20 0543 11/12/20 0859  NA 143   < > 140  --  143  --  137 139 139  --   K 4.8   < > 4.7  --  4.3  --  4.2 3.8 3.7  --   CL 104   < > 101  --  101  --  102 99 97*  --   CO2 24   < > 25  --  30  --  27 27 30   --   GLUCOSE 193*   < > 212*  --  223*  --  182* 183* 157*  --   BUN 71*   < > 74*  --  57*  --  46* 65* 33*  --   CREATININE 4.32*   < > 4.71*  --  4.05*  --  3.09* 4.20* 2.73*  --   CALCIUM 9.1   < > 9.0   < > 8.8*   < > 8.1* 8.3* 8.4*  --   MG 1.8  --  1.7  --  1.7  --   --    --   --   --   PHOS  --   --   --   --   --   --   --   --   --  3.4   < > = values in this interval not displayed.    Liver Function Tests: Recent Labs  Lab 11/07/20 0538  AST 11*  ALT 12  ALKPHOS 58  BILITOT 0.7  PROT 6.4*  ALBUMIN 3.6   No results for input(s): LIPASE, AMYLASE in the last 168 hours. No results for input(s): AMMONIA in the last 168 hours.  CBC: Recent Labs  Lab 11/07/20 0538 11/08/20 0530 11/09/20 0502 11/10/20 0513 11/11/20 0543  WBC 15.4* 10.9* 12.7* 9.8 11.5*  NEUTROABS 11.7* 7.6 10.1* 7.0 7.8*  HGB 7.9* 7.7* 7.4* 7.0* 9.1*  HCT 24.8* 24.2* 23.3* 22.6* 27.5*  MCV 91.9 92.0 90.7 93.0 89.9  PLT 351 372 322 319 319  Cardiac Enzymes: No results for input(s): CKTOTAL, CKMB, CKMBINDEX, TROPONINI in the last 168 hours.  BNP: Invalid input(s): POCBNP  CBG: Recent Labs  Lab 11/11/20 1733 11/11/20 2037 11/12/20 0729 11/12/20 1400 11/12/20 1419  GLUCAP 91 88 87 110* 109*    Microbiology: Results for orders placed or performed during the hospital encounter of 11/01/20  Respiratory Panel by RT PCR (Flu A&B, Covid) - Nasopharyngeal Swab     Status: None   Collection Time: 11/01/20  9:18 PM   Specimen: Nasopharyngeal Swab  Result Value Ref Range Status   SARS Coronavirus 2 by RT PCR NEGATIVE NEGATIVE Final    Comment: (NOTE) SARS-CoV-2 target nucleic acids are NOT DETECTED.  The SARS-CoV-2 RNA is generally detectable in upper respiratoy specimens during the acute phase of infection. The lowest concentration of SARS-CoV-2 viral copies this assay can detect is 131 copies/mL. A negative result does not preclude SARS-Cov-2 infection and should not be used as the sole basis for treatment or other patient management decisions. A negative result may occur with  improper specimen collection/handling, submission of specimen other than nasopharyngeal swab, presence of viral mutation(s) within the areas targeted by this assay, and inadequate number  of viral copies (<131 copies/mL). A negative result must be combined with clinical observations, patient history, and epidemiological information. The expected result is Negative.  Fact Sheet for Patients:  PinkCheek.be  Fact Sheet for Healthcare Providers:  GravelBags.it  This test is no t yet approved or cleared by the [place] FDA and  has been authorized for detection and/or diagnosis of SARS-CoV-2 by FDA under an Emergency Use Authorization (EUA). This EUA will remain  in effect (meaning this test can be used) for the duration of the COVID-19 declaration under Section 564(b)(1) of the Act, 21 U.S.C. section 360bbb-3(b)(1), unless the authorization is terminated or revoked sooner.     Influenza A by PCR NEGATIVE NEGATIVE Final   Influenza B by PCR NEGATIVE NEGATIVE Final    Comment: (NOTE) The Xpert Xpress SARS-CoV-2/FLU/RSV assay is intended as an aid in  the diagnosis of influenza from Nasopharyngeal swab specimens and  should not be used as a sole basis for treatment. Nasal washings and  aspirates are unacceptable for Xpert Xpress SARS-CoV-2/FLU/RSV  testing.  Fact Sheet for Patients: PinkCheek.be  Fact Sheet for Healthcare Providers: GravelBags.it  This test is not yet approved or cleared by the [place] FDA and  has been authorized for detection and/or diagnosis of SARS-CoV-2 by  FDA under an Emergency Use Authorization (EUA). This EUA will remain  in effect (meaning this test can be used) for the duration of the  Covid-19 declaration under Section 564(b)(1) of the Act, 21  U.S.C. section 360bbb-3(b)(1), unless the authorization is  terminated or revoked. Performed at [facility], [address] [place], [place] [address]   MRSA PCR Screening     Status: None   Collection Time: 11/07/20  1:30 AM   Specimen: Nasal Mucosa;  Nasopharyngeal  Result Value Ref Range Status   MRSA by PCR NEGATIVE NEGATIVE Final    Comment:        The GeneXpert MRSA Assay (FDA approved for NASAL specimens only), is one component of a comprehensive MRSA colonization surveillance program. It is not intended to diagnose MRSA infection nor to guide or monitor treatment for MRSA infections. Performed at [facility], [address] [place],  [address]     Coagulation Studies: No results for input(s): LABPROT, INR in the  last 72 hours.  Urinalysis: No results for input(s): COLORURINE, LABSPEC, PHURINE, GLUCOSEU, HGBUR, BILIRUBINUR, KETONESUR, PROTEINUR, UROBILINOGEN, NITRITE, LEUKOCYTESUR in the last 72 hours.  Invalid input(s): APPERANCEUR    Imaging: No results found.   Medications:   . sodium chloride 250 mL (11/07/20 0522)   . sodium chloride   Intravenous Once  . amLODipine  10 mg Oral Daily  . aspirin EC  81 mg Oral Daily  . atorvastatin  80 mg Oral q1800  . carvedilol  25 mg Oral BID WC  . Chlorhexidine Gluconate Cloth  6 each Topical Q0600  . citalopram  10 mg Oral Daily  . famotidine  10 mg Oral Daily  . fenofibrate  160 mg Oral q1800  . furosemide  40 mg Oral BID  . heparin  5,000 Units Subcutaneous Q8H  . insulin aspart  0-20 Units Subcutaneous TID WC  . insulin aspart  0-5 Units Subcutaneous QHS  . insulin aspart  4 Units Subcutaneous TID WC  . insulin glargine  30 Units Subcutaneous Daily  . isosorbide mononitrate  30 mg Oral Daily  . mirtazapine  30 mg Oral QHS  . mometasone-formoterol  2 puff Inhalation BID  . polyethylene glycol  17 g Oral Daily  . prednisoLONE acetate  1 drop Right Eye QID  . predniSONE  20 mg Oral Q breakfast  . senna-docusate  2 tablet Oral BID  . sodium chloride flush  3 mL Intravenous Q12H  . topiramate  25 mg Oral Daily  . cyanocobalamin  1,000 mcg Oral Daily   sodium chloride, acetaminophen, albuterol, bisacodyl, cyclobenzaprine,  ipratropium-albuterol, LORazepam, ondansetron (ZOFRAN) IV, sodium chloride flush  Assessment/ Plan:  59 y.o. female with past medical history of congestive heart failure, diabetes mellitus type 2 with chronic kidney disease, hypertension, hyperlipidemia, chronic kidney disease stage IV with diabetic nephropathy and interstitial nephritis, history of brain tumor, left eye blindness who presented with increasing shortness of breath and lower extremity edema.   #End-stage renal disease #proteinuria #mild interstitial nephritis.  #Bilateral lower extremity edema  Receiving dialysis treatment today Patient will be discharged home after dialysis We will resume dialysis from next Monday at her outpatient dialysis center  #Anemia of chronic kidney disease.   Lab Results  Component Value Date   HGB 9.1 (L) 11/11/2020  Will continue monitoring  # Diabetes mellitus type 2 with chronic kidney disease Lab Results  Component Value Date   HGBA1C 8.6 (H) 10/11/2020  Continue antihyperglycemic management as outpatient     LOS: 11 Terry Arroyo 11/24/20214:27 PM

## 2020-11-12 NOTE — Discharge Summary (Signed)
Terry Arroyo at [place] NAME: Terry Arroyo    MR#:  [id number]  DATE OF BIRTH:  1961/03/01  DATE OF ADMISSION:  11/01/2020 ADMITTING PHYSICIAN: Para Skeans, MD  DATE OF DISCHARGE: 11/12/2020  PRIMARY CARE PHYSICIAN: Kirk Ruths, MD    ADMISSION DIAGNOSIS:  CN (constipation) [K59.00] Hyperglycemia [R73.9] Elevated troponin [R77.8] Chest pain [R07.9] Renal failure, unspecified chronicity [N19]  DISCHARGE DIAGNOSIS:  acute on chronic systolic congestive heart failure chronic kidney disease stage V-- progress to end-stage renal disease now started on dialysis type II diabetes with chronic kidney disease anemia of chronic disease status post one unit blood transfusion  SECONDARY DIAGNOSIS:   Past Medical History:  Diagnosis Date   Blind left eye    Brain tumor ([place]) 1986   CHF (congestive heart failure) ([place])    Diabetes mellitus without complication ([place])    Hyperlipidemia    Hypertension    MI (myocardial infarction) ([place]) 2018   Stroke [facility])    stated affected left side.    HOSPITAL COURSE:  59 year old female with history oftype 2 diabetes, essential hypertension, history of CVA, coronary disease, chronic systolic congestive heart failure with ejection fraction 30-35%(03/21).She came to the hospital complaining increase short of breath, chest pain, and leg edema. She also has worsening renal function. She has been seen by nephrology and cardiology, she is started on IV Lasix drip.  #Acute on chornic HFrEF with  exacerbation # Coronary artery disease. -Patient reports few days predominantly dyspnea with exertion worse than baseline. - EF 30-35 earlier this year.  -Troponins mildly elevated and flat appeared demand ischemia in the setting of CHF -Hx multi--vessel cabg 2018.  - daily weights, strict I/os - resumed home torsemide - cont aspirin, atorvastatin, carvedilol, imdur - cardiology consult with Dr  Clayborn Bigness on 11/03/20 -- oxygen sats hundred percent on room air  # CKD 4/5-- progressed to end-stage renal disease now on dialysis # Nephrotic syndrome -Per nephrology, renal biopsy from 08/2020 shows background diabetic nephropathy and mild hypersensitivity reaction.  GFR of about 15 seems to be new baseline. Cr down-trending a bit today. UPC markedly elevated (15). UOP 2 L yesterday, neg neg 750. -Discussed with Dr. Juleen China. Will discontinue prednisone and sodium bicarb.  -- Per Dr. Juleen China resume torsemide 40 mg daily. He will adjust dose again as outpatient.  # COPD  Appears more or less quiescent - cont home dulera, albuterol prn  # Debility - PT/OT consult-- recommends home health PT OT arranged  # Hypokalemia  resolved  # HTN  - cont home amlodipine, coreg, imdur -resume torsemide 40 mg daily as before.  # Anemia of chronic kidney disease -received one unit of blood transfusion  # T2DM, with hyperglycemia and CKD stage IV/end-stage renal failure  - here glucose elevated to 200s. - SSI resistant -sugars stable. I have decreased her insulin home dose 70/3215 units BID  # OSA defer to primary care physician for follow-up   DVT prophylaxis: heparin Code Status: full Family Communication: none @ bedside  Status is: Inpatient  Dispo: The patient is from: Home  Anticipated d/c is to: home w/ home health (PT has seen, home health orders have been placed)  Anticipated d/c date is: today after HD  Patient currently is medically stable to d/c.  Just discharge plan with patient. She is in agreement with discharging home. Social worker to make sure patient has transportation for dialysis. Okay from nephrology Dr. Juleen China to go home  after HD today   CONSULTS OBTAINED:    DRUG ALLERGIES:   Allergies  Allergen Reactions   Hydralazine Shortness Of Breath and Swelling    Body aches   Shellfish Allergy Anaphylaxis     Shrimp/lobster  Shrimp/lobster     Kiwi Extract Swelling   Metformin Other (See Comments)    Other reaction(s): GI Intolerance   Tape Itching    Use paper tape whenever possible    DISCHARGE MEDICATIONS:   Allergies as of 11/12/2020      Reactions   Hydralazine Shortness Of Breath, Swelling   Body aches   Shellfish Allergy Anaphylaxis   Shrimp/lobster  Shrimp/lobster    Kiwi Extract Swelling   Metformin Other (See Comments)   Other reaction(s): GI Intolerance   Tape Itching   Use paper tape whenever possible      Medication List    STOP taking these medications   predniSONE 20 MG tablet Commonly known as: DELTASONE   sodium bicarbonate 650 MG tablet     TAKE these medications   albuterol 108 (90 Base) MCG/ACT inhaler Commonly known as: VENTOLIN HFA Inhale 1-2 puffs into the lungs every 6 (six) hours as needed for wheezing or shortness of breath.   amLODipine 10 MG tablet Commonly known as: NORVASC Take 1 tablet (10 mg total) by mouth daily.   ammonium lactate 12 % lotion Commonly known as: LAC-HYDRIN Apply 1 application topically as needed for dry skin.   aspirin 81 MG EC tablet Take 1 tablet (81 mg total) by mouth daily. Do not take this medication for 1 week and then restart.   atorvastatin 80 MG tablet Commonly known as: LIPITOR Take 1 tablet (80 mg total) by mouth daily at 6 PM.   carvedilol 25 MG tablet Commonly known as: COREG Take 25 mg by mouth 2 (two) times daily with a meal.   cyanocobalamin 1000 MCG tablet Take 1,000 mcg by mouth daily.   cyclobenzaprine 5 MG tablet Commonly known as: FLEXERIL Take 5 mg by mouth 2 (two) times daily as needed for muscle spasms.   fenofibrate 145 MG tablet Commonly known as: TRICOR Take 145 mg by mouth daily.   GLYCERIN-HYPROMELLOSE-PEG 400 OP Place 1 drop into both eyes 4 (four) times daily.   insulin aspart protamine- aspart (70-30) 100 UNIT/ML injection Commonly known as: NOVOLOG MIX  70/30 Inject 0.15 mLs (15 Units total) into the skin 2 (two) times daily with a meal. What changed: how much to take   isosorbide mononitrate 30 MG 24 hr tablet Commonly known as: IMDUR Take 30 mg by mouth daily.   mirtazapine 30 MG tablet Commonly known as: REMERON Take 1 tablet (30 mg total) by mouth at bedtime.   mometasone-formoterol 200-5 MCG/ACT Aero Commonly known as: DULERA Inhale 2 puffs into the lungs 2 (two) times daily.   omeprazole 20 MG capsule Commonly known as: PRILOSEC Take 20 mg by mouth daily.   prednisoLONE acetate 1 % ophthalmic suspension Commonly known as: PRED FORTE Place 1 drop into the right eye 4 (four) times daily.   topiramate 25 MG tablet Commonly known as: TOPAMAX Take 25 mg by mouth daily.   torsemide 10 MG tablet Commonly known as: DEMADEX Take 2 tablets (20 mg total) by mouth daily.            Durable Medical Equipment  (From admission, onward)         Start     Ordered   11/12/20 0936  DME Gilford Rile  Once       Question Answer Comment  Walker: With [place]   Patient needs a walker to treat with the following condition CHF (congestive heart failure) ([place])      11/12/20 0935          If you experience worsening of your admission symptoms, develop shortness of breath, life threatening emergency, suicidal or homicidal thoughts you must seek medical attention immediately by calling 911 or calling your MD immediately  if symptoms less severe.  You Must read complete instructions/literature along with all the possible adverse reactions/side effects for all the Medicines you take and that have been prescribed to you. Take any new Medicines after you have completely understood and accept all the possible adverse reactions/side effects.   Please note  You were cared for by a hospitalist during your hospital stay. If you have any questions about your discharge medications or the care you received while you were in the hospital  after you are discharged, you can call the unit and asked to speak with the hospitalist on call if the hospitalist that took care of you is not available. Once you are discharged, your primary care physician will handle any further medical issues. Please note that NO REFILLS for any discharge medications will be authorized once you are discharged, as it is imperative that you return to your primary care physician (or establish a relationship with a primary care physician if you do not have one) for your aftercare needs so that they can reassess your need for medications and monitor your lab values. Today   SUBJECTIVE   No new complaints  VITAL SIGNS:  Blood pressure (!) 151/76, pulse 64, temperature 98.2 F (36.8 C), temperature source Oral, resp. rate 18, height 5\' 6"  (1.676 m), weight 80.6 kg, SpO2 99 %.  I/O:    Intake/Output Summary (Last 24 hours) at 11/12/2020 0945 Last data filed at 11/12/2020 0414 Gross per 24 hour  Intake 600 ml  Output 1650 ml  Net -1050 ml    PHYSICAL EXAMINATION:  GENERAL:  59 y.o.-year-old patient lying in the bed with no acute distress.  EYES: Pupils equal, round, reactive to light and accommodation. No scleral icterus.  HEENT: Head atraumatic, normocephalic. Oropharynx and nasopharynx clear.  NECK:  Supple, no jugular venous distention. No thyroid enlargement, no tenderness.  LUNGS: Normal breath sounds bilaterally, no wheezing, rales,rhonchi or crepitation. No use of accessory muscles of respiration.  CARDIOVASCULAR: S1, S2 normal. No murmurs, rubs, or gallops.  ABDOMEN: Soft, non-tender, non-distended. Bowel sounds present. No organomegaly or mass.  EXTREMITIES: No pedal edema, cyanosis, or clubbing.  NEUROLOGIC: Cranial nerves II through XII are intact. Muscle strength 5/5 in all extremities. Sensation intact. Gait not checked.  PSYCHIATRIC: The patient is alert and oriented x 3.  SKIN: No obvious rash, lesion, or ulcer.   DATA REVIEW:   CBC   Recent Labs  Lab 11/11/20 0543  WBC 11.5*  HGB 9.1*  HCT 27.5*  PLT 319    Chemistries  Recent Labs  Lab 11/07/20 0538 11/07/20 0538 11/08/20 0530 11/09/20 0502 11/11/20 0543  NA 140   < > 143   < > 139  K 4.7   < > 4.3   < > 3.7  CL 101   < > 101   < > 97*  CO2 25   < > 30   < > 30  GLUCOSE 212*   < > 223*   < > 157*  BUN 74*   < > 57*   < > 33*  CREATININE 4.71*   < > 4.05*   < > 2.73*  CALCIUM 9.0   < > 8.8*   < > 8.4*  MG 1.7   < > 1.7  --   --   AST 11*  --   --   --   --   ALT 12  --   --   --   --   ALKPHOS 58  --   --   --   --   BILITOT 0.7  --   --   --   --    < > = values in this interval not displayed.    Microbiology Results   Recent Results (from the past 240 hour(s))  MRSA PCR Screening     Status: None   Collection Time: 11/07/20  1:30 AM   Specimen: Nasal Mucosa; Nasopharyngeal  Result Value Ref Range Status   MRSA by PCR NEGATIVE NEGATIVE Final    Comment:        The GeneXpert MRSA Assay (FDA approved for NASAL specimens only), is one component of a comprehensive MRSA colonization surveillance program. It is not intended to diagnose MRSA infection nor to guide or monitor treatment for MRSA infections. Performed at [facility], [address], [place], [place] [address]     RADIOLOGY:  No results found.   CODE STATUS:     Code Status Orders  (From admission, onward)         Start     Ordered   11/01/20 2136  Full code  Continuous        11/01/20 2136        Code Status History    Date Active Date Inactive Code Status Order ID Comments User Context   10/22/2020 0016 10/28/2020 2317 Full Code [id number]  Lenore Cordia, MD ED   10/10/2020 2233 10/12/2020 2036 Full Code [id number]  Mansy, Arvella Merles, MD ED   09/12/2020 1701 09/15/2020 1902 Full Code [id number]  Wyvonnia Dusky, MD ED   02/26/2020 1946 03/02/2020 2055 Full Code [id number]  Toy Baker, MD ED   01/22/2020 0208 01/23/2020 2047 Full Code [id number]  Mansy,  Arvella Merles, MD ED   11/23/2019 1827 11/26/2019 1613 Full Code [id number]  Lavina Hamman, MD ED   Advance Care Planning Activity       TOTAL TIME TAKING CARE OF THIS PATIENT: *35* minutes.    Fritzi Mandes M.D  [facility]    CC: Primary care physician; Kirk Ruths, MD

## 2020-11-12 NOTE — TOC Transition Note (Signed)
Transition of Care [facility]) - CM/SW Discharge Note   Patient Details  Name: Terry Arroyo MRN: [id number] Date of Birth: 11/30/1961  Transition of Care [facility]: [name]) CM/SW Contact:  [name], LCSW Phone Number: 11/12/2020, 1:35 PM   Clinical Narrative:    CSW notified patient is discharging back to [place], patient has been provided with a taxi voucher for her transport home today. Attending requested CSW make sure patient has transportation arranged for her dialysis appointments. [facility] MWF 11:45am. Patient can start on Monday 11/29 at [place]. [place]   Patient uses [name] medical transportation [phone]. CSW contacted [name], spoke with [name], confirmed days/times for appointments and the address.    Final next level of care: [place] Barriers to Discharge: Continued Medical Work up   Patient Goals and CMS Choice Patient states their goals for this hospitalization and ongoing recovery are:: To go home with home health services      Discharge Placement                       Discharge Plan and Services     Post Acute Care Choice: [place]: RN, Nurse's Aide, PT, OT [facility] Agency: [facility] Date [facility] Agency Contacted: 11/03/20 Time [place]: [place] Representative spoke with at [place]: [name] ([place]) Interventions     Readmission Risk Interventions Readmission Risk Prevention Plan 11/03/2020 10/23/2020 10/12/2020  Transportation Screening Complete Complete Complete  Medication Review Press photographer) Complete Complete Complete  PCP or Specialist appointment within 3-5 days of discharge Not Complete Complete Complete  PCP/Specialist Appt Not Complete comments To be scheduled by unit secretary. - -  HRI or Home Care Consult Complete Complete -  SW Recovery Care/Counseling Consult Complete Complete Complete  Palliative Care  Screening Not Applicable Not Applicable Not Applicable  Skilled Nursing Facility Patient Refused Not Applicable Not Complete  SNF Comments Patient refuses states she is able to take care of herself. - Patient pending evaluation  Some recent data might be hidden

## 2020-11-14 DIAGNOSIS — E1165 Type 2 diabetes mellitus with hyperglycemia: Secondary | ICD-10-CM | POA: Diagnosis not present

## 2020-11-14 DIAGNOSIS — I2510 Atherosclerotic heart disease of native coronary artery without angina pectoris: Secondary | ICD-10-CM | POA: Diagnosis not present

## 2020-11-14 DIAGNOSIS — N184 Chronic kidney disease, stage 4 (severe): Secondary | ICD-10-CM | POA: Diagnosis not present

## 2020-11-14 DIAGNOSIS — I130 Hypertensive heart and chronic kidney disease with heart failure and stage 1 through stage 4 chronic kidney disease, or unspecified chronic kidney disease: Secondary | ICD-10-CM | POA: Diagnosis not present

## 2020-11-14 DIAGNOSIS — E785 Hyperlipidemia, unspecified: Secondary | ICD-10-CM | POA: Diagnosis not present

## 2020-11-14 DIAGNOSIS — E1122 Type 2 diabetes mellitus with diabetic chronic kidney disease: Secondary | ICD-10-CM | POA: Diagnosis not present

## 2020-11-14 DIAGNOSIS — I5022 Chronic systolic (congestive) heart failure: Secondary | ICD-10-CM | POA: Diagnosis not present

## 2020-11-14 DIAGNOSIS — G4733 Obstructive sleep apnea (adult) (pediatric): Secondary | ICD-10-CM | POA: Diagnosis not present

## 2020-11-14 DIAGNOSIS — J449 Chronic obstructive pulmonary disease, unspecified: Secondary | ICD-10-CM | POA: Diagnosis not present

## 2020-11-17 ENCOUNTER — Other Ambulatory Visit: Payer: Self-pay

## 2020-11-17 DIAGNOSIS — E119 Type 2 diabetes mellitus without complications: Secondary | ICD-10-CM | POA: Diagnosis not present

## 2020-11-17 DIAGNOSIS — Z794 Long term (current) use of insulin: Secondary | ICD-10-CM | POA: Diagnosis not present

## 2020-11-17 DIAGNOSIS — Z992 Dependence on renal dialysis: Secondary | ICD-10-CM | POA: Diagnosis not present

## 2020-11-17 DIAGNOSIS — N186 End stage renal disease: Secondary | ICD-10-CM | POA: Diagnosis not present

## 2020-11-17 NOTE — Patient Outreach (Signed)
[facility]) Care Management  11/17/2020  Terry Arroyo 02/13/1961 [id number]    EMMI- General Discharge RED ON EMMI ALERT Day # 1 Date: 11/15/2020 Red Alert Reason: "Scheduled follow-up? No Other questions/problems? Yes"    Outreach attempt # 1 to patient. No answer. RN CM left HIPAA compliant voicemail message along with contact info.     Plan: RN CM will make outreach attempt to patient within 3-4 business days. RN CM will send unsuccessful outreach letter to patient.  [name], RN,BSN,CCM [place] Management Telephonic Care Management Coordinator Direct Phone: [phone] Toll Free: [phone] Fax: [phone]

## 2020-11-18 ENCOUNTER — Other Ambulatory Visit: Payer: Self-pay

## 2020-11-18 DIAGNOSIS — J449 Chronic obstructive pulmonary disease, unspecified: Secondary | ICD-10-CM | POA: Diagnosis not present

## 2020-11-18 DIAGNOSIS — I130 Hypertensive heart and chronic kidney disease with heart failure and stage 1 through stage 4 chronic kidney disease, or unspecified chronic kidney disease: Secondary | ICD-10-CM | POA: Diagnosis not present

## 2020-11-18 DIAGNOSIS — E1165 Type 2 diabetes mellitus with hyperglycemia: Secondary | ICD-10-CM | POA: Diagnosis not present

## 2020-11-18 DIAGNOSIS — I5022 Chronic systolic (congestive) heart failure: Secondary | ICD-10-CM | POA: Diagnosis not present

## 2020-11-18 DIAGNOSIS — Z992 Dependence on renal dialysis: Secondary | ICD-10-CM | POA: Diagnosis not present

## 2020-11-18 DIAGNOSIS — G4733 Obstructive sleep apnea (adult) (pediatric): Secondary | ICD-10-CM | POA: Diagnosis not present

## 2020-11-18 DIAGNOSIS — N184 Chronic kidney disease, stage 4 (severe): Secondary | ICD-10-CM | POA: Diagnosis not present

## 2020-11-18 DIAGNOSIS — N186 End stage renal disease: Secondary | ICD-10-CM | POA: Diagnosis not present

## 2020-11-18 DIAGNOSIS — E785 Hyperlipidemia, unspecified: Secondary | ICD-10-CM | POA: Diagnosis not present

## 2020-11-18 DIAGNOSIS — I2510 Atherosclerotic heart disease of native coronary artery without angina pectoris: Secondary | ICD-10-CM | POA: Diagnosis not present

## 2020-11-18 DIAGNOSIS — E1122 Type 2 diabetes mellitus with diabetic chronic kidney disease: Secondary | ICD-10-CM | POA: Diagnosis not present

## 2020-11-18 NOTE — Patient Outreach (Signed)
[name] [facility]) Care Management  11/18/2020  Terry Arroyo 1961-10-09 [id number]    EMMI- General Discharge RED ON EMMI ALERT Day # 1 Date: 11/15/2020 Red Alert Reason: "Scheduled follow-up? No Other questions/problems? Yes"     Outreach attempt # 2 to patient. No answer at present.      Plan: RN CM will make outreach attempt to patient within 3-4 business days.  [name], RN,BSN,CCM [place] Management Telephonic Care Management Coordinator Direct Phone: [phone] Toll Free: [phone] Fax: [phone]

## 2020-11-19 ENCOUNTER — Telehealth (HOSPITAL_COMMUNITY): Payer: Self-pay

## 2020-11-19 ENCOUNTER — Other Ambulatory Visit: Payer: Self-pay

## 2020-11-19 DIAGNOSIS — N186 End stage renal disease: Secondary | ICD-10-CM | POA: Diagnosis not present

## 2020-11-19 DIAGNOSIS — Z992 Dependence on renal dialysis: Secondary | ICD-10-CM | POA: Diagnosis not present

## 2020-11-19 NOTE — Patient Outreach (Addendum)
[facility]) Care Management  11/19/2020  Terry Arroyo 1961-07-18 [id number]   EMMI-General Discharge RED ON EMMI ALERT Day #1 Date:11/15/2020 Red Alert Reason:"Scheduled follow-up? No Other questions/problems? Yes"   EMMI-General Discharge RED ON EMMI ALERT Day # 4 Date: 11/18/2020 Red Alert Reason: "Scheduled follow-up? No Sad/hopeless/anxious/empty? Yes other questions/problems? Yes"   Unsuccessful outreach attempt # 3 to patient.      Plan: RN CM will make outreach attempt to patient within 3-4 wks if no response from letter mailed to patient.  [name], RN,BSN,CCM [place] Management Telephonic Care Management Coordinator Direct Phone: [phone] Toll Free: [phone] Fax: [phone]

## 2020-11-19 NOTE — Telephone Encounter (Signed)
Attempted to contact to set up home visit.  No answer/left message.   Daril Warga [place] EMT-Paramedic [phone] 

## 2020-11-21 DIAGNOSIS — E785 Hyperlipidemia, unspecified: Secondary | ICD-10-CM | POA: Diagnosis not present

## 2020-11-21 DIAGNOSIS — E1122 Type 2 diabetes mellitus with diabetic chronic kidney disease: Secondary | ICD-10-CM | POA: Diagnosis not present

## 2020-11-21 DIAGNOSIS — N186 End stage renal disease: Secondary | ICD-10-CM | POA: Diagnosis not present

## 2020-11-21 DIAGNOSIS — J449 Chronic obstructive pulmonary disease, unspecified: Secondary | ICD-10-CM | POA: Diagnosis not present

## 2020-11-21 DIAGNOSIS — I2510 Atherosclerotic heart disease of native coronary artery without angina pectoris: Secondary | ICD-10-CM | POA: Diagnosis not present

## 2020-11-21 DIAGNOSIS — N184 Chronic kidney disease, stage 4 (severe): Secondary | ICD-10-CM | POA: Diagnosis not present

## 2020-11-21 DIAGNOSIS — Z992 Dependence on renal dialysis: Secondary | ICD-10-CM | POA: Diagnosis not present

## 2020-11-21 DIAGNOSIS — E1165 Type 2 diabetes mellitus with hyperglycemia: Secondary | ICD-10-CM | POA: Diagnosis not present

## 2020-11-21 DIAGNOSIS — I5022 Chronic systolic (congestive) heart failure: Secondary | ICD-10-CM | POA: Diagnosis not present

## 2020-11-21 DIAGNOSIS — I130 Hypertensive heart and chronic kidney disease with heart failure and stage 1 through stage 4 chronic kidney disease, or unspecified chronic kidney disease: Secondary | ICD-10-CM | POA: Diagnosis not present

## 2020-11-21 DIAGNOSIS — G4733 Obstructive sleep apnea (adult) (pediatric): Secondary | ICD-10-CM | POA: Diagnosis not present

## 2020-11-24 DIAGNOSIS — Z992 Dependence on renal dialysis: Secondary | ICD-10-CM | POA: Diagnosis not present

## 2020-11-24 DIAGNOSIS — N186 End stage renal disease: Secondary | ICD-10-CM | POA: Diagnosis not present

## 2020-11-25 DIAGNOSIS — I2510 Atherosclerotic heart disease of native coronary artery without angina pectoris: Secondary | ICD-10-CM | POA: Diagnosis not present

## 2020-11-25 DIAGNOSIS — I5022 Chronic systolic (congestive) heart failure: Secondary | ICD-10-CM | POA: Diagnosis not present

## 2020-11-25 DIAGNOSIS — E1165 Type 2 diabetes mellitus with hyperglycemia: Secondary | ICD-10-CM | POA: Diagnosis not present

## 2020-11-25 DIAGNOSIS — N184 Chronic kidney disease, stage 4 (severe): Secondary | ICD-10-CM | POA: Diagnosis not present

## 2020-11-25 DIAGNOSIS — I130 Hypertensive heart and chronic kidney disease with heart failure and stage 1 through stage 4 chronic kidney disease, or unspecified chronic kidney disease: Secondary | ICD-10-CM | POA: Diagnosis not present

## 2020-11-25 DIAGNOSIS — E785 Hyperlipidemia, unspecified: Secondary | ICD-10-CM | POA: Diagnosis not present

## 2020-11-25 DIAGNOSIS — E1122 Type 2 diabetes mellitus with diabetic chronic kidney disease: Secondary | ICD-10-CM | POA: Diagnosis not present

## 2020-11-25 DIAGNOSIS — G4733 Obstructive sleep apnea (adult) (pediatric): Secondary | ICD-10-CM | POA: Diagnosis not present

## 2020-11-25 DIAGNOSIS — J449 Chronic obstructive pulmonary disease, unspecified: Secondary | ICD-10-CM | POA: Diagnosis not present

## 2020-11-26 DIAGNOSIS — Z992 Dependence on renal dialysis: Secondary | ICD-10-CM | POA: Diagnosis not present

## 2020-11-26 DIAGNOSIS — N186 End stage renal disease: Secondary | ICD-10-CM | POA: Diagnosis not present

## 2020-11-27 DIAGNOSIS — E1122 Type 2 diabetes mellitus with diabetic chronic kidney disease: Secondary | ICD-10-CM | POA: Diagnosis not present

## 2020-11-27 DIAGNOSIS — Z992 Dependence on renal dialysis: Secondary | ICD-10-CM | POA: Diagnosis not present

## 2020-11-27 DIAGNOSIS — N186 End stage renal disease: Secondary | ICD-10-CM | POA: Diagnosis not present

## 2020-11-27 DIAGNOSIS — Z794 Long term (current) use of insulin: Secondary | ICD-10-CM | POA: Diagnosis not present

## 2020-11-27 LAB — ECHOCARDIOGRAM COMPLETE
AR max vel: 1.57 cm2
AV Peak grad: 12.4 mmHg
Ao pk vel: 1.76 m/s
Area-P 1/2: 2.56 cm2
Calc EF: 44.8 %
Height: 66 in
S' Lateral: 4.3 cm
Single Plane A2C EF: 46.6 %
Single Plane A4C EF: 41.9 %
Weight: 2975.68 oz

## 2020-11-28 DIAGNOSIS — N186 End stage renal disease: Secondary | ICD-10-CM | POA: Diagnosis not present

## 2020-11-28 DIAGNOSIS — Z992 Dependence on renal dialysis: Secondary | ICD-10-CM | POA: Diagnosis not present

## 2020-12-01 DIAGNOSIS — N186 End stage renal disease: Secondary | ICD-10-CM | POA: Diagnosis not present

## 2020-12-01 DIAGNOSIS — Z992 Dependence on renal dialysis: Secondary | ICD-10-CM | POA: Diagnosis not present

## 2020-12-02 ENCOUNTER — Telehealth (HOSPITAL_COMMUNITY): Payer: Self-pay

## 2020-12-02 DIAGNOSIS — J449 Chronic obstructive pulmonary disease, unspecified: Secondary | ICD-10-CM | POA: Diagnosis not present

## 2020-12-02 DIAGNOSIS — E1165 Type 2 diabetes mellitus with hyperglycemia: Secondary | ICD-10-CM | POA: Diagnosis not present

## 2020-12-02 DIAGNOSIS — E1122 Type 2 diabetes mellitus with diabetic chronic kidney disease: Secondary | ICD-10-CM | POA: Diagnosis not present

## 2020-12-02 DIAGNOSIS — G4733 Obstructive sleep apnea (adult) (pediatric): Secondary | ICD-10-CM | POA: Diagnosis not present

## 2020-12-02 DIAGNOSIS — N186 End stage renal disease: Secondary | ICD-10-CM | POA: Diagnosis not present

## 2020-12-02 DIAGNOSIS — I2510 Atherosclerotic heart disease of native coronary artery without angina pectoris: Secondary | ICD-10-CM | POA: Diagnosis not present

## 2020-12-02 DIAGNOSIS — I132 Hypertensive heart and chronic kidney disease with heart failure and with stage 5 chronic kidney disease, or end stage renal disease: Secondary | ICD-10-CM | POA: Diagnosis not present

## 2020-12-02 DIAGNOSIS — I5022 Chronic systolic (congestive) heart failure: Secondary | ICD-10-CM | POA: Diagnosis not present

## 2020-12-02 DIAGNOSIS — D631 Anemia in chronic kidney disease: Secondary | ICD-10-CM | POA: Diagnosis not present

## 2020-12-02 NOTE — Telephone Encounter (Signed)
Was able to get in touch with Terry Arroyo today.  She states doin ok very tired.  She is taking dialysis on M W F.  Made a home appt for Tuesday next week.  She wishes to have the [employer] meals, sent message to Tammy Sours in [name] to restart since she is at home.  She states [facility] PT has came but no one else.  She could really use an aid.  At discharge they said she was getting all the services.  She states PT was checking on it for her.  Will check to make sure they will be coming during the appt.  She states she is able to do some small things around the house but tires out quickly.  She states she has everything she needs.  She has all her medications.  She is making her dialysis appts, she is new with dialysis.  Will visit for heart failure.   [place] [phone]

## 2020-12-03 ENCOUNTER — Telehealth (HOSPITAL_COMMUNITY): Payer: Self-pay | Admitting: Licensed Clinical Social Worker

## 2020-12-03 DIAGNOSIS — N186 End stage renal disease: Secondary | ICD-10-CM | POA: Diagnosis not present

## 2020-12-03 DIAGNOSIS — Z992 Dependence on renal dialysis: Secondary | ICD-10-CM | POA: Diagnosis not present

## 2020-12-03 NOTE — Telephone Encounter (Signed)
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 CSW sent secure message to [facility] Meals requesting meals restart. CSW available as needed. Raquel Sarna, [place], [place]

## 2020-12-05 DIAGNOSIS — N186 End stage renal disease: Secondary | ICD-10-CM | POA: Diagnosis not present

## 2020-12-05 DIAGNOSIS — Z992 Dependence on renal dialysis: Secondary | ICD-10-CM | POA: Diagnosis not present

## 2020-12-08 DIAGNOSIS — Z992 Dependence on renal dialysis: Secondary | ICD-10-CM | POA: Diagnosis not present

## 2020-12-08 DIAGNOSIS — N186 End stage renal disease: Secondary | ICD-10-CM | POA: Diagnosis not present

## 2020-12-09 ENCOUNTER — Other Ambulatory Visit (HOSPITAL_COMMUNITY): Payer: Self-pay

## 2020-12-09 DIAGNOSIS — I132 Hypertensive heart and chronic kidney disease with heart failure and with stage 5 chronic kidney disease, or end stage renal disease: Secondary | ICD-10-CM | POA: Diagnosis not present

## 2020-12-09 DIAGNOSIS — E1122 Type 2 diabetes mellitus with diabetic chronic kidney disease: Secondary | ICD-10-CM | POA: Diagnosis not present

## 2020-12-09 DIAGNOSIS — E1165 Type 2 diabetes mellitus with hyperglycemia: Secondary | ICD-10-CM | POA: Diagnosis not present

## 2020-12-09 DIAGNOSIS — G4733 Obstructive sleep apnea (adult) (pediatric): Secondary | ICD-10-CM | POA: Diagnosis not present

## 2020-12-09 DIAGNOSIS — N186 End stage renal disease: Secondary | ICD-10-CM | POA: Diagnosis not present

## 2020-12-09 DIAGNOSIS — I5022 Chronic systolic (congestive) heart failure: Secondary | ICD-10-CM | POA: Diagnosis not present

## 2020-12-09 DIAGNOSIS — I2510 Atherosclerotic heart disease of native coronary artery without angina pectoris: Secondary | ICD-10-CM | POA: Diagnosis not present

## 2020-12-09 DIAGNOSIS — J449 Chronic obstructive pulmonary disease, unspecified: Secondary | ICD-10-CM | POA: Diagnosis not present

## 2020-12-09 DIAGNOSIS — D631 Anemia in chronic kidney disease: Secondary | ICD-10-CM | POA: Diagnosis not present

## 2020-12-09 NOTE — Progress Notes (Signed)
Attempted to make a home visit, not sure if she is at home or not.  No answer, attempted her phone also and left message.  Waited about 30 minutes with no response.  Will continue to try to contact and visit.    [place] [phone]

## 2020-12-10 ENCOUNTER — Other Ambulatory Visit: Payer: Self-pay

## 2020-12-10 DIAGNOSIS — Z992 Dependence on renal dialysis: Secondary | ICD-10-CM | POA: Diagnosis not present

## 2020-12-10 DIAGNOSIS — N186 End stage renal disease: Secondary | ICD-10-CM | POA: Diagnosis not present

## 2020-12-10 NOTE — Patient Outreach (Signed)
[facility]) Care Management  12/10/2020  FELIPE CABELL 08-17-1961 [id number]    EMMI-General Discharge RED ON EMMI ALERT Day #1 Date:11/15/2020 Red Alert Reason:"Scheduled follow-up? No Other questions/problems? Yes"   Multiple attempts to establish contact with patient without success. No response from letter mailed to patient. Case is being closed at this time.     Plan: RN CM will close case at this time.   [name], RN,BSN,CCM [place] Management Telephonic Care Management Coordinator Direct Phone: [phone] Toll Free: [phone] Fax: [phone]

## 2020-12-12 DIAGNOSIS — Z992 Dependence on renal dialysis: Secondary | ICD-10-CM | POA: Diagnosis not present

## 2020-12-12 DIAGNOSIS — N186 End stage renal disease: Secondary | ICD-10-CM | POA: Diagnosis not present

## 2020-12-15 DIAGNOSIS — Z992 Dependence on renal dialysis: Secondary | ICD-10-CM | POA: Diagnosis not present

## 2020-12-15 DIAGNOSIS — N186 End stage renal disease: Secondary | ICD-10-CM | POA: Diagnosis not present

## 2020-12-16 DIAGNOSIS — N186 End stage renal disease: Secondary | ICD-10-CM | POA: Diagnosis not present

## 2020-12-16 DIAGNOSIS — D631 Anemia in chronic kidney disease: Secondary | ICD-10-CM | POA: Diagnosis not present

## 2020-12-16 DIAGNOSIS — T82818A Embolism of vascular prosthetic devices, implants and grafts, initial encounter: Secondary | ICD-10-CM | POA: Diagnosis not present

## 2020-12-16 DIAGNOSIS — E1122 Type 2 diabetes mellitus with diabetic chronic kidney disease: Secondary | ICD-10-CM | POA: Diagnosis not present

## 2020-12-16 DIAGNOSIS — Z4901 Encounter for fitting and adjustment of extracorporeal dialysis catheter: Secondary | ICD-10-CM | POA: Diagnosis not present

## 2020-12-16 DIAGNOSIS — J449 Chronic obstructive pulmonary disease, unspecified: Secondary | ICD-10-CM | POA: Diagnosis not present

## 2020-12-16 DIAGNOSIS — I120 Hypertensive chronic kidney disease with stage 5 chronic kidney disease or end stage renal disease: Secondary | ICD-10-CM | POA: Diagnosis not present

## 2020-12-17 DIAGNOSIS — Z992 Dependence on renal dialysis: Secondary | ICD-10-CM | POA: Diagnosis not present

## 2020-12-17 DIAGNOSIS — N186 End stage renal disease: Secondary | ICD-10-CM | POA: Diagnosis not present

## 2020-12-18 DIAGNOSIS — D631 Anemia in chronic kidney disease: Secondary | ICD-10-CM | POA: Diagnosis not present

## 2020-12-18 DIAGNOSIS — E1165 Type 2 diabetes mellitus with hyperglycemia: Secondary | ICD-10-CM | POA: Diagnosis not present

## 2020-12-18 DIAGNOSIS — I5022 Chronic systolic (congestive) heart failure: Secondary | ICD-10-CM | POA: Diagnosis not present

## 2020-12-18 DIAGNOSIS — E1122 Type 2 diabetes mellitus with diabetic chronic kidney disease: Secondary | ICD-10-CM | POA: Diagnosis not present

## 2020-12-18 DIAGNOSIS — J449 Chronic obstructive pulmonary disease, unspecified: Secondary | ICD-10-CM | POA: Diagnosis not present

## 2020-12-18 DIAGNOSIS — N186 End stage renal disease: Secondary | ICD-10-CM | POA: Diagnosis not present

## 2020-12-18 DIAGNOSIS — I2510 Atherosclerotic heart disease of native coronary artery without angina pectoris: Secondary | ICD-10-CM | POA: Diagnosis not present

## 2020-12-18 DIAGNOSIS — I132 Hypertensive heart and chronic kidney disease with heart failure and with stage 5 chronic kidney disease, or end stage renal disease: Secondary | ICD-10-CM | POA: Diagnosis not present

## 2020-12-18 DIAGNOSIS — G4733 Obstructive sleep apnea (adult) (pediatric): Secondary | ICD-10-CM | POA: Diagnosis not present

## 2020-12-19 DIAGNOSIS — Z992 Dependence on renal dialysis: Secondary | ICD-10-CM | POA: Diagnosis not present

## 2020-12-19 DIAGNOSIS — N186 End stage renal disease: Secondary | ICD-10-CM | POA: Diagnosis not present

## 2020-12-22 DIAGNOSIS — N186 End stage renal disease: Secondary | ICD-10-CM | POA: Diagnosis not present

## 2020-12-22 DIAGNOSIS — Z992 Dependence on renal dialysis: Secondary | ICD-10-CM | POA: Diagnosis not present

## 2020-12-24 DIAGNOSIS — Z992 Dependence on renal dialysis: Secondary | ICD-10-CM | POA: Diagnosis not present

## 2020-12-24 DIAGNOSIS — N186 End stage renal disease: Secondary | ICD-10-CM | POA: Diagnosis not present

## 2020-12-26 DIAGNOSIS — Z992 Dependence on renal dialysis: Secondary | ICD-10-CM | POA: Diagnosis not present

## 2020-12-26 DIAGNOSIS — N186 End stage renal disease: Secondary | ICD-10-CM | POA: Diagnosis not present

## 2020-12-29 DIAGNOSIS — Z992 Dependence on renal dialysis: Secondary | ICD-10-CM | POA: Diagnosis not present

## 2020-12-29 DIAGNOSIS — N186 End stage renal disease: Secondary | ICD-10-CM | POA: Diagnosis not present

## 2020-12-30 ENCOUNTER — Other Ambulatory Visit (INDEPENDENT_AMBULATORY_CARE_PROVIDER_SITE_OTHER): Payer: Self-pay | Admitting: Vascular Surgery

## 2020-12-30 DIAGNOSIS — N186 End stage renal disease: Secondary | ICD-10-CM

## 2020-12-31 DIAGNOSIS — N186 End stage renal disease: Secondary | ICD-10-CM | POA: Diagnosis not present

## 2020-12-31 DIAGNOSIS — Z992 Dependence on renal dialysis: Secondary | ICD-10-CM | POA: Diagnosis not present

## 2021-01-01 ENCOUNTER — Encounter (INDEPENDENT_AMBULATORY_CARE_PROVIDER_SITE_OTHER): Payer: Self-pay | Admitting: Vascular Surgery

## 2021-01-01 ENCOUNTER — Ambulatory Visit (INDEPENDENT_AMBULATORY_CARE_PROVIDER_SITE_OTHER): Payer: Medicare HMO | Admitting: Vascular Surgery

## 2021-01-01 ENCOUNTER — Ambulatory Visit (INDEPENDENT_AMBULATORY_CARE_PROVIDER_SITE_OTHER): Payer: Medicare HMO

## 2021-01-01 ENCOUNTER — Other Ambulatory Visit: Payer: Self-pay

## 2021-01-01 VITALS — BP 136/77 | HR 66 | Resp 16 | Ht 66.0 in | Wt 174.2 lb

## 2021-01-01 DIAGNOSIS — I152 Hypertension secondary to endocrine disorders: Secondary | ICD-10-CM | POA: Diagnosis not present

## 2021-01-01 DIAGNOSIS — E1159 Type 2 diabetes mellitus with other circulatory complications: Secondary | ICD-10-CM

## 2021-01-01 DIAGNOSIS — N1330 Unspecified hydronephrosis: Secondary | ICD-10-CM | POA: Insufficient documentation

## 2021-01-01 DIAGNOSIS — N186 End stage renal disease: Secondary | ICD-10-CM

## 2021-01-01 DIAGNOSIS — Z794 Long term (current) use of insulin: Secondary | ICD-10-CM | POA: Diagnosis not present

## 2021-01-01 DIAGNOSIS — J3489 Other specified disorders of nose and nasal sinuses: Secondary | ICD-10-CM | POA: Insufficient documentation

## 2021-01-01 DIAGNOSIS — E1122 Type 2 diabetes mellitus with diabetic chronic kidney disease: Secondary | ICD-10-CM | POA: Diagnosis not present

## 2021-01-01 DIAGNOSIS — I25118 Atherosclerotic heart disease of native coronary artery with other forms of angina pectoris: Secondary | ICD-10-CM

## 2021-01-01 DIAGNOSIS — L258 Unspecified contact dermatitis due to other agents: Secondary | ICD-10-CM | POA: Insufficient documentation

## 2021-01-01 DIAGNOSIS — J323 Chronic sphenoidal sinusitis: Secondary | ICD-10-CM | POA: Insufficient documentation

## 2021-01-01 DIAGNOSIS — N183 Chronic kidney disease, stage 3 unspecified: Secondary | ICD-10-CM

## 2021-01-02 DIAGNOSIS — Z992 Dependence on renal dialysis: Secondary | ICD-10-CM | POA: Diagnosis not present

## 2021-01-02 DIAGNOSIS — N186 End stage renal disease: Secondary | ICD-10-CM | POA: Diagnosis not present

## 2021-01-04 ENCOUNTER — Encounter (INDEPENDENT_AMBULATORY_CARE_PROVIDER_SITE_OTHER): Payer: Self-pay | Admitting: Vascular Surgery

## 2021-01-04 NOTE — Progress Notes (Signed)
MRN : [id number]  Terry Arroyo is a 60 y.o. (25-Mar-1961) female who presents with chief complaint of  Chief Complaint  Patient presents with  . New Patient (Initial Visit)    Ref [place] vein mapping  .  History of Present Illness:    The patient is seen for evaluation of dialysis access.  The patient has a history of multiple failed accesses.  There have been accesses in both arms and in the thighs.    Current access is via a catheter which is functioning well.  There have not been any episodes of catheter infection.  The patient denies amaurosis fugax or recent TIA symptoms. There are no recent neurological changes noted. The patient denies claudication symptoms or rest pain symptoms. The patient denies history of DVT, PE or superficial thrombophlebitis. The patient denies recent episodes of angina or shortness of breath.    Current Meds  Medication Sig  . albuterol (VENTOLIN HFA) 108 (90 Base) MCG/ACT inhaler Inhale 1-2 puffs into the lungs every 6 (six) hours as needed for wheezing or shortness of breath.  Marland Kitchen amLODipine (NORVASC) 10 MG tablet Take 1 tablet (10 mg total) by mouth daily.  Marland Kitchen ammonium lactate (LAC-HYDRIN) 12 % lotion Apply 1 application topically as needed for dry skin.  Marland Kitchen aspirin 81 MG EC tablet Take 1 tablet (81 mg total) by mouth daily. Do not take this medication for 1 week and then restart.  Marland Kitchen atorvastatin (LIPITOR) 80 MG tablet Take 1 tablet (80 mg total) by mouth daily at 6 PM.  . carvedilol (COREG) 25 MG tablet Take 25 mg by mouth 2 (two) times daily with a meal.   . cyanocobalamin 1000 MCG tablet Take 1,000 mcg by mouth daily.  . cyclobenzaprine (FLEXERIL) 5 MG tablet Take 5 mg by mouth 2 (two) times daily as needed for muscle spasms.  . fenofibrate (TRICOR) 145 MG tablet Take 145 mg by mouth daily.  Marland Kitchen GLYCERIN-HYPROMELLOSE-PEG 400 OP Place 1 drop into both eyes 4 (four) times daily.  . insulin aspart protamine- aspart (NOVOLOG MIX 70/30) (70-30) 100  UNIT/ML injection Inject 0.15 mLs (15 Units total) into the skin 2 (two) times daily with a meal.  . isosorbide mononitrate (IMDUR) 30 MG 24 hr tablet Take 30 mg by mouth daily.  . mirtazapine (REMERON) 30 MG tablet Take 1 tablet (30 mg total) by mouth at bedtime.  . mometasone-formoterol (DULERA) 200-5 MCG/ACT AERO Inhale 2 puffs into the lungs 2 (two) times daily.  Marland Kitchen omeprazole (PRILOSEC) 20 MG capsule Take 20 mg by mouth daily.   . prednisoLONE acetate (PRED FORTE) 1 % ophthalmic suspension Place 1 drop into the right eye 4 (four) times daily.  Marland Kitchen topiramate (TOPAMAX) 25 MG tablet Take 25 mg by mouth daily.  Marland Kitchen torsemide (DEMADEX) 10 MG tablet Take 2 tablets (20 mg total) by mouth daily.    Past Medical History:  Diagnosis Date  . Blind left eye   . Brain tumor ([place]) 1986  . CHF (congestive heart failure) ([place])   . Diabetes mellitus without complication ([place])   . Hyperlipidemia   . Hypertension   . MI (myocardial infarction) ([place]) 2018  . Stroke [facility])    stated affected left side.    Past Surgical History:  Procedure Laterality Date  . BRAIN SURGERY    . BREAST EXCISIONAL BIOPSY Left yrs ago   benign  . CESAREAN SECTION    . DIALYSIS/PERMA CATHETER INSERTION N/A 11/07/2020   Procedure: DIALYSIS/PERMA CATHETER  INSERTION;  Surgeon: Algernon Huxley, MD;  Location: [facility];  Service: Cardiovascular;  Laterality: N/A;    Social History Social History   Tobacco Use  . Smoking status: Former Research scientist (life sciences)  . Smokeless tobacco: Never Used  Vaping Use  . Vaping Use: Never used  Substance Use Topics  . Alcohol use: Not Currently  . Drug use: Not Currently    Family History Family History  Problem Relation Age of Onset  . CAD Mother   . CAD Brother   . Breast cancer Sister 20    Allergies  Allergen Reactions  . Hydralazine Shortness Of Breath and Swelling    Body aches  . Shellfish Allergy Anaphylaxis    Shrimp/lobster  Shrimp/lobster    . Kiwi Extract Swelling   . Metformin Other (See Comments)    Other reaction(s): GI Intolerance  . Tape Itching    Use paper tape whenever possible     REVIEW OF SYSTEMS (Negative unless checked)  Constitutional: [] Weight loss  [] Fever  [] Chills Cardiac: [] Chest pain   [] Chest pressure   [] Palpitations   [] Shortness of breath when laying flat   [] Shortness of breath with exertion. Vascular:  [] Pain in legs with walking   [] Pain in legs at rest  [] History of DVT   [] Phlebitis   [] Swelling in legs   [] Varicose veins   [] Non-healing ulcers Pulmonary:   [] Uses home oxygen   [] Productive cough   [] Hemoptysis   [] Wheeze  [] COPD   [] Asthma Neurologic:  [] Dizziness   [] Seizures   [] History of stroke   [] History of TIA  [] Aphasia   [] Vissual changes   [] Weakness or numbness in arm   [] Weakness or numbness in leg Musculoskeletal:   [] Joint swelling   [] Joint pain   [] Low back pain Hematologic:  [] Easy bruising  [] Easy bleeding   [] Hypercoagulable state   [] Anemic Gastrointestinal:  [] Diarrhea   [] Vomiting  [] Gastroesophageal reflux/heartburn   [] Difficulty swallowing. Genitourinary:  [x] Chronic kidney disease   [] Difficult urination  [] Frequent urination   [] Blood in urine Skin:  [] Rashes   [] Ulcers  Psychological:  [] History of anxiety   []  History of major depression.  Physical Examination  Vitals:   01/01/21 1532  BP: 136/77  Pulse: 66  Resp: 16  Weight: 174 lb 3.2 oz (79 kg)  Height: 5\' 6"  (1.676 m)   Body mass index is 28.12 kg/m. Gen: WD/WN, NAD Head: [place]/AT, No temporalis wasting.  Ear/Nose/Throat: Hearing grossly intact, nares w/o erythema or drainage Eyes: PER, EOMI, sclera nonicteric.  Neck: Supple, no large masses.   Pulmonary:  Good air movement, no audible wheezing bilaterally, no use of accessory muscles.  Cardiac: RRR, no JVD Vascular: superficial veins are visible and compressible; right IJ catheter CD&I Vessel Right Left  Radial Palpable Palpable  Ulnar Palpable Palpable  Brachial Palpable  Palpable  Gastrointestinal: Non-distended. No guarding/no peritoneal signs.  Musculoskeletal: M/S 5/5 throughout.  No deformity or atrophy.  Neurologic: CN 2-12 intact. Symmetrical.  Speech is fluent. Motor exam as listed above. Psychiatric: Judgment intact, Mood & affect appropriate for pt's clinical situation. Dermatologic: No rashes or ulcers noted.  No changes consistent with cellulitis.  CBC Lab Results  Component Value Date   WBC 11.5 (H) 11/11/2020   HGB 9.1 (L) 11/11/2020   HCT 27.5 (L) 11/11/2020   MCV 89.9 11/11/2020   PLT 319 11/11/2020    BMET    Component Value Date/Time   NA 139 11/11/2020 0543   NA 134 (L)  11/18/2013 0138   K 3.7 11/11/2020 0543   K 3.8 11/18/2013 0138   CL 97 (L) 11/11/2020 0543   CL 98 11/18/2013 0138   CO2 30 11/11/2020 0543   CO2 29 11/18/2013 0138   GLUCOSE 157 (H) 11/11/2020 0543   GLUCOSE 348 (H) 11/18/2013 0138   BUN 33 (H) 11/11/2020 0543   BUN 11 11/18/2013 0138   CREATININE 2.73 (H) 11/11/2020 0543   CREATININE 0.87 11/18/2013 0138   CALCIUM 8.4 (L) 11/11/2020 0543   CALCIUM 9.4 11/18/2013 0138   GFRNONAA 20 (L) 11/11/2020 0543   GFRNONAA >60 11/18/2013 0138   GFRAA 18 (L) 09/15/2020 0403   GFRAA >60 11/18/2013 0138   CrCl cannot be calculated (Patient's most recent lab result is older than the maximum 21 days allowed.).  COAG Lab Results  Component Value Date   INR 1.0 09/11/2020   INR 0.9 02/26/2020   INR 1.7 11/18/2013    Radiology VAS [place] UPPER EXTREMITY ARTERIAL DUPLEX  Result Date: 01/01/2021 UPPER EXTREMITY DUPLEX STUDY Indications: Patient complains of esrd.  Performing Technologist: Concha Norway RVT  Examination Guidelines: A complete evaluation includes B-mode imaging, spectral Doppler, color Doppler, and power Doppler as needed of all accessible portions of each vessel. Bilateral testing is considered an integral part of a complete examination. Limited examinations for reoccurring indications may be performed  as noted.  Right Pre-Dialysis Findings: +-----------------------+----------+--------------------+---------+--------+ Location               PSV (cm/s)Intralum. Diam. (cm)Waveform Comments +-----------------------+----------+--------------------+---------+--------+ Brachial Antecub. fossa84        0.46                triphasic         +-----------------------+----------+--------------------+---------+--------+ Radial Art at Wrist    63        0.21                triphasic         +-----------------------+----------+--------------------+---------+--------+ Ulnar Art at Wrist     95        0.23                triphasic         +-----------------------+----------+--------------------+---------+--------+  Left Pre-Dialysis Findings: +-----------------------+----------+--------------------+---------+--------+ Location               PSV (cm/s)Intralum. Diam. (cm)Waveform Comments +-----------------------+----------+--------------------+---------+--------+ Brachial Antecub. fossa73        0.48                biphasic          +-----------------------+----------+--------------------+---------+--------+ Radial Art at Wrist    76        0.20                triphasic         +-----------------------+----------+--------------------+---------+--------+ Ulnar Art at Wrist     92        0.25                triphasic         +-----------------------+----------+--------------------+---------+--------+  Summary:  Right: No obstruction visualized in the right upper extremity. Left: No obstruction visualized in the left upper extremity. *See table(s) above for measurements and observations. Electronically signed by [name] MD on 01/01/2021 at 4:39:08 PM.    Final    VAS [place] UPPER EXT VEIN MAPPING (PRE-OP AVF)  Result Date: 01/01/2021 UPPER EXTREMITY VEIN MAPPING  Indications: Pre-access. History: Esrd.  Performing Technologist: Concha Norway RVT  Examination Guidelines: A complete  evaluation includes B-mode imaging, spectral Doppler, color Doppler, and power Doppler as needed of all accessible portions of each vessel. Bilateral testing is considered an integral part of a complete examination. Limited examinations for reoccurring indications may be performed as noted. +-----------------+-------------+----------+--------+ Right Cephalic   Diameter (cm)Depth (cm)Findings +-----------------+-------------+----------+--------+ Prox upper arm       0.29                        +-----------------+-------------+----------+--------+ Mid upper arm        0.31                        +-----------------+-------------+----------+--------+ Dist upper arm       0.29                        +-----------------+-------------+----------+--------+ Antecubital fossa    0.15                        +-----------------+-------------+----------+--------+ Prox forearm         0.27                        +-----------------+-------------+----------+--------+ Mid forearm          0.18                        +-----------------+-------------+----------+--------+ Dist forearm         0.16                        +-----------------+-------------+----------+--------+ +-----------------+-------------+----------+--------+ Right Basilic    Diameter (cm)Depth (cm)Findings +-----------------+-------------+----------+--------+ Prox upper arm       0.45                        +-----------------+-------------+----------+--------+ Mid upper arm        0.36                        +-----------------+-------------+----------+--------+ Dist upper arm       0.35                        +-----------------+-------------+----------+--------+ Antecubital fossa    0.34                        +-----------------+-------------+----------+--------+ Prox forearm         0.28                        +-----------------+-------------+----------+--------+  +-----------------+-------------+----------+--------+ Left Cephalic    Diameter (cm)Depth (cm)Findings +-----------------+-------------+----------+--------+ Prox upper arm       0.31                        +-----------------+-------------+----------+--------+ Mid upper arm        0.27                        +-----------------+-------------+----------+--------+ Dist upper arm       0.26                        +-----------------+-------------+----------+--------+ Antecubital fossa    0.28                        +-----------------+-------------+----------+--------+  Prox forearm         0.20                        +-----------------+-------------+----------+--------+ Mid forearm          0.18                        +-----------------+-------------+----------+--------+ Dist forearm         0.21                        +-----------------+-------------+----------+--------+ +-----------------+-------------+----------+--------+ Left Basilic     Diameter (cm)Depth (cm)Findings +-----------------+-------------+----------+--------+ Prox upper arm       0.39                        +-----------------+-------------+----------+--------+ Mid upper arm        0.27                        +-----------------+-------------+----------+--------+ Dist upper arm       0.27                        +-----------------+-------------+----------+--------+ Antecubital fossa    0.29                        +-----------------+-------------+----------+--------+ Prox forearm         0.22                        +-----------------+-------------+----------+--------+ Summary: Right: Compressible good caliber veins. Left: Compressible good caliber veins. *See table(s) above for measurements and observations.  Diagnosing physician: [name] MD Electronically signed by [name] MD on 01/01/2021 at 4:39:06 PM.    Final      Assessment/Plan 1. ESRD (end stage renal disease)  ([place]) Recommend:  At this time the patient does not have appropriate extremity access for dialysis  Patient should have a right brachial cephalic fistula created.  The risks, benefits and alternative therapies were reviewed in detail with the patient.  All questions were answered.  The patient agrees to proceed with surgery.    2. Coronary artery disease of native artery of native heart with stable angina pectoris ([place]) Continue cardiac and antihypertensive medications as already ordered and reviewed, no changes at this time.  Continue statin as ordered and reviewed, no changes at this time  Nitrates PRN for chest pain   3. Hypertension associated with diabetes ([place]) Continue antihypertensive medications as already ordered, these medications have been reviewed and there are no changes at this time.   4. Type 2 diabetes mellitus with stage 3 chronic kidney disease, with long-term current use of insulin, unspecified whether stage 3a or 3b CKD ([place]) Continue hypoglycemic medications as already ordered, these medications have been reviewed and there are no changes at this time.  Hgb A1C to be monitored as already arranged by primary service     [name], MD  01/04/2021 2:10 PM

## 2021-01-04 NOTE — H&P (View-Only) (Signed)
MRN : [id number]  Terry Arroyo is a 60 y.o. (07-14-1961) female who presents with chief complaint of  Chief Complaint  Patient presents with  . New Patient (Initial Visit)    Ref [place] vein mapping  .  History of Present Illness:    The patient is seen for evaluation of dialysis access.  The patient has a history of multiple failed accesses.  There have been accesses in both arms and in the thighs.    Current access is via a catheter which is functioning well.  There have not been any episodes of catheter infection.  The patient denies amaurosis fugax or recent TIA symptoms. There are no recent neurological changes noted. The patient denies claudication symptoms or rest pain symptoms. The patient denies history of DVT, PE or superficial thrombophlebitis. The patient denies recent episodes of angina or shortness of breath.    Current Meds  Medication Sig  . albuterol (VENTOLIN HFA) 108 (90 Base) MCG/ACT inhaler Inhale 1-2 puffs into the lungs every 6 (six) hours as needed for wheezing or shortness of breath.  Marland Kitchen amLODipine (NORVASC) 10 MG tablet Take 1 tablet (10 mg total) by mouth daily.  Marland Kitchen ammonium lactate (LAC-HYDRIN) 12 % lotion Apply 1 application topically as needed for dry skin.  Marland Kitchen aspirin 81 MG EC tablet Take 1 tablet (81 mg total) by mouth daily. Do not take this medication for 1 week and then restart.  Marland Kitchen atorvastatin (LIPITOR) 80 MG tablet Take 1 tablet (80 mg total) by mouth daily at 6 PM.  . carvedilol (COREG) 25 MG tablet Take 25 mg by mouth 2 (two) times daily with a meal.   . cyanocobalamin 1000 MCG tablet Take 1,000 mcg by mouth daily.  . cyclobenzaprine (FLEXERIL) 5 MG tablet Take 5 mg by mouth 2 (two) times daily as needed for muscle spasms.  . fenofibrate (TRICOR) 145 MG tablet Take 145 mg by mouth daily.  Marland Kitchen GLYCERIN-HYPROMELLOSE-PEG 400 OP Place 1 drop into both eyes 4 (four) times daily.  . insulin aspart protamine- aspart (NOVOLOG MIX 70/30) (70-30) 100  UNIT/ML injection Inject 0.15 mLs (15 Units total) into the skin 2 (two) times daily with a meal.  . isosorbide mononitrate (IMDUR) 30 MG 24 hr tablet Take 30 mg by mouth daily.  . mirtazapine (REMERON) 30 MG tablet Take 1 tablet (30 mg total) by mouth at bedtime.  . mometasone-formoterol (DULERA) 200-5 MCG/ACT AERO Inhale 2 puffs into the lungs 2 (two) times daily.  Marland Kitchen omeprazole (PRILOSEC) 20 MG capsule Take 20 mg by mouth daily.   . prednisoLONE acetate (PRED FORTE) 1 % ophthalmic suspension Place 1 drop into the right eye 4 (four) times daily.  Marland Kitchen topiramate (TOPAMAX) 25 MG tablet Take 25 mg by mouth daily.  Marland Kitchen torsemide (DEMADEX) 10 MG tablet Take 2 tablets (20 mg total) by mouth daily.    Past Medical History:  Diagnosis Date  . Blind left eye   . Brain tumor ([place]) 1986  . CHF (congestive heart failure) ([place])   . Diabetes mellitus without complication ([place])   . Hyperlipidemia   . Hypertension   . MI (myocardial infarction) ([place]) 2018  . Stroke [facility])    stated affected left side.    Past Surgical History:  Procedure Laterality Date  . BRAIN SURGERY    . BREAST EXCISIONAL BIOPSY Left yrs ago   benign  . CESAREAN SECTION    . DIALYSIS/PERMA CATHETER INSERTION N/A 11/07/2020   Procedure: DIALYSIS/PERMA CATHETER  INSERTION;  Surgeon: Algernon Huxley, MD;  Location: [facility];  Service: Cardiovascular;  Laterality: N/A;    Social History Social History   Tobacco Use  . Smoking status: Former Research scientist (life sciences)  . Smokeless tobacco: Never Used  Vaping Use  . Vaping Use: Never used  Substance Use Topics  . Alcohol use: Not Currently  . Drug use: Not Currently    Family History Family History  Problem Relation Age of Onset  . CAD Mother   . CAD Brother   . Breast cancer Sister 38    Allergies  Allergen Reactions  . Hydralazine Shortness Of Breath and Swelling    Body aches  . Shellfish Allergy Anaphylaxis    Shrimp/lobster  Shrimp/lobster    . Kiwi Extract Swelling   . Metformin Other (See Comments)    Other reaction(s): GI Intolerance  . Tape Itching    Use paper tape whenever possible     REVIEW OF SYSTEMS (Negative unless checked)  Constitutional: [] Weight loss  [] Fever  [] Chills Cardiac: [] Chest pain   [] Chest pressure   [] Palpitations   [] Shortness of breath when laying flat   [] Shortness of breath with exertion. Vascular:  [] Pain in legs with walking   [] Pain in legs at rest  [] History of DVT   [] Phlebitis   [] Swelling in legs   [] Varicose veins   [] Non-healing ulcers Pulmonary:   [] Uses home oxygen   [] Productive cough   [] Hemoptysis   [] Wheeze  [] COPD   [] Asthma Neurologic:  [] Dizziness   [] Seizures   [] History of stroke   [] History of TIA  [] Aphasia   [] Vissual changes   [] Weakness or numbness in arm   [] Weakness or numbness in leg Musculoskeletal:   [] Joint swelling   [] Joint pain   [] Low back pain Hematologic:  [] Easy bruising  [] Easy bleeding   [] Hypercoagulable state   [] Anemic Gastrointestinal:  [] Diarrhea   [] Vomiting  [] Gastroesophageal reflux/heartburn   [] Difficulty swallowing. Genitourinary:  [x] Chronic kidney disease   [] Difficult urination  [] Frequent urination   [] Blood in urine Skin:  [] Rashes   [] Ulcers  Psychological:  [] History of anxiety   []  History of major depression.  Physical Examination  Vitals:   01/01/21 1532  BP: 136/77  Pulse: 66  Resp: 16  Weight: 174 lb 3.2 oz (79 kg)  Height: 5\' 6"  (1.676 m)   Body mass index is 28.12 kg/m. Gen: WD/WN, NAD Head: [place]/AT, No temporalis wasting.  Ear/Nose/Throat: Hearing grossly intact, nares w/o erythema or drainage Eyes: PER, EOMI, sclera nonicteric.  Neck: Supple, no large masses.   Pulmonary:  Good air movement, no audible wheezing bilaterally, no use of accessory muscles.  Cardiac: RRR, no JVD Vascular: superficial veins are visible and compressible; right IJ catheter CD&I Vessel Right Left  Radial Palpable Palpable  Ulnar Palpable Palpable  Brachial Palpable  Palpable  Gastrointestinal: Non-distended. No guarding/no peritoneal signs.  Musculoskeletal: M/S 5/5 throughout.  No deformity or atrophy.  Neurologic: CN 2-12 intact. Symmetrical.  Speech is fluent. Motor exam as listed above. Psychiatric: Judgment intact, Mood & affect appropriate for pt's clinical situation. Dermatologic: No rashes or ulcers noted.  No changes consistent with cellulitis.  CBC Lab Results  Component Value Date   WBC 11.5 (H) 11/11/2020   HGB 9.1 (L) 11/11/2020   HCT 27.5 (L) 11/11/2020   MCV 89.9 11/11/2020   PLT 319 11/11/2020    BMET    Component Value Date/Time   NA 139 11/11/2020 0543   NA 134 (L)  11/18/2013 0138   K 3.7 11/11/2020 0543   K 3.8 11/18/2013 0138   CL 97 (L) 11/11/2020 0543   CL 98 11/18/2013 0138   CO2 30 11/11/2020 0543   CO2 29 11/18/2013 0138   GLUCOSE 157 (H) 11/11/2020 0543   GLUCOSE 348 (H) 11/18/2013 0138   BUN 33 (H) 11/11/2020 0543   BUN 11 11/18/2013 0138   CREATININE 2.73 (H) 11/11/2020 0543   CREATININE 0.87 11/18/2013 0138   CALCIUM 8.4 (L) 11/11/2020 0543   CALCIUM 9.4 11/18/2013 0138   GFRNONAA 20 (L) 11/11/2020 0543   GFRNONAA >60 11/18/2013 0138   GFRAA 18 (L) 09/15/2020 0403   GFRAA >60 11/18/2013 0138   CrCl cannot be calculated (Patient's most recent lab result is older than the maximum 21 days allowed.).  COAG Lab Results  Component Value Date   INR 1.0 09/11/2020   INR 0.9 02/26/2020   INR 1.7 11/18/2013    Radiology VAS [place] UPPER EXTREMITY ARTERIAL DUPLEX  Result Date: 01/01/2021 UPPER EXTREMITY DUPLEX STUDY Indications: Patient complains of esrd.  Performing Technologist: Concha Norway RVT  Examination Guidelines: A complete evaluation includes B-mode imaging, spectral Doppler, color Doppler, and power Doppler as needed of all accessible portions of each vessel. Bilateral testing is considered an integral part of a complete examination. Limited examinations for reoccurring indications may be performed  as noted.  Right Pre-Dialysis Findings: +-----------------------+----------+--------------------+---------+--------+ Location               PSV (cm/s)Intralum. Diam. (cm)Waveform Comments +-----------------------+----------+--------------------+---------+--------+ Brachial Antecub. fossa84        0.46                triphasic         +-----------------------+----------+--------------------+---------+--------+ Radial Art at Wrist    63        0.21                triphasic         +-----------------------+----------+--------------------+---------+--------+ Ulnar Art at Wrist     95        0.23                triphasic         +-----------------------+----------+--------------------+---------+--------+  Left Pre-Dialysis Findings: +-----------------------+----------+--------------------+---------+--------+ Location               PSV (cm/s)Intralum. Diam. (cm)Waveform Comments +-----------------------+----------+--------------------+---------+--------+ Brachial Antecub. fossa73        0.48                biphasic          +-----------------------+----------+--------------------+---------+--------+ Radial Art at Wrist    76        0.20                triphasic         +-----------------------+----------+--------------------+---------+--------+ Ulnar Art at Wrist     92        0.25                triphasic         +-----------------------+----------+--------------------+---------+--------+  Summary:  Right: No obstruction visualized in the right upper extremity. Left: No obstruction visualized in the left upper extremity. *See table(s) above for measurements and observations. Electronically signed by [name] MD on 01/01/2021 at 4:39:08 PM.    Final    VAS [place] UPPER EXT VEIN MAPPING (PRE-OP AVF)  Result Date: 01/01/2021 UPPER EXTREMITY VEIN MAPPING  Indications: Pre-access. History: Esrd.  Performing Technologist: Concha Norway RVT  Examination Guidelines: A complete  evaluation includes B-mode imaging, spectral Doppler, color Doppler, and power Doppler as needed of all accessible portions of each vessel. Bilateral testing is considered an integral part of a complete examination. Limited examinations for reoccurring indications may be performed as noted. +-----------------+-------------+----------+--------+ Right Cephalic   Diameter (cm)Depth (cm)Findings +-----------------+-------------+----------+--------+ Prox upper arm       0.29                        +-----------------+-------------+----------+--------+ Mid upper arm        0.31                        +-----------------+-------------+----------+--------+ Dist upper arm       0.29                        +-----------------+-------------+----------+--------+ Antecubital fossa    0.15                        +-----------------+-------------+----------+--------+ Prox forearm         0.27                        +-----------------+-------------+----------+--------+ Mid forearm          0.18                        +-----------------+-------------+----------+--------+ Dist forearm         0.16                        +-----------------+-------------+----------+--------+ +-----------------+-------------+----------+--------+ Right Basilic    Diameter (cm)Depth (cm)Findings +-----------------+-------------+----------+--------+ Prox upper arm       0.45                        +-----------------+-------------+----------+--------+ Mid upper arm        0.36                        +-----------------+-------------+----------+--------+ Dist upper arm       0.35                        +-----------------+-------------+----------+--------+ Antecubital fossa    0.34                        +-----------------+-------------+----------+--------+ Prox forearm         0.28                        +-----------------+-------------+----------+--------+  +-----------------+-------------+----------+--------+ Left Cephalic    Diameter (cm)Depth (cm)Findings +-----------------+-------------+----------+--------+ Prox upper arm       0.31                        +-----------------+-------------+----------+--------+ Mid upper arm        0.27                        +-----------------+-------------+----------+--------+ Dist upper arm       0.26                        +-----------------+-------------+----------+--------+ Antecubital fossa    0.28                        +-----------------+-------------+----------+--------+  Prox forearm         0.20                        +-----------------+-------------+----------+--------+ Mid forearm          0.18                        +-----------------+-------------+----------+--------+ Dist forearm         0.21                        +-----------------+-------------+----------+--------+ +-----------------+-------------+----------+--------+ Left Basilic     Diameter (cm)Depth (cm)Findings +-----------------+-------------+----------+--------+ Prox upper arm       0.39                        +-----------------+-------------+----------+--------+ Mid upper arm        0.27                        +-----------------+-------------+----------+--------+ Dist upper arm       0.27                        +-----------------+-------------+----------+--------+ Antecubital fossa    0.29                        +-----------------+-------------+----------+--------+ Prox forearm         0.22                        +-----------------+-------------+----------+--------+ Summary: Right: Compressible good caliber veins. Left: Compressible good caliber veins. *See table(s) above for measurements and observations.  Diagnosing physician: [name] MD Electronically signed by [name] MD on 01/01/2021 at 4:39:06 PM.    Final      Assessment/Plan 1. ESRD (end stage renal disease)  ([place]) Recommend:  At this time the patient does not have appropriate extremity access for dialysis  Patient should have a right brachial cephalic fistula created.  The risks, benefits and alternative therapies were reviewed in detail with the patient.  All questions were answered.  The patient agrees to proceed with surgery.    2. Coronary artery disease of native artery of native heart with stable angina pectoris ([place]) Continue cardiac and antihypertensive medications as already ordered and reviewed, no changes at this time.  Continue statin as ordered and reviewed, no changes at this time  Nitrates PRN for chest pain   3. Hypertension associated with diabetes ([place]) Continue antihypertensive medications as already ordered, these medications have been reviewed and there are no changes at this time.   4. Type 2 diabetes mellitus with stage 3 chronic kidney disease, with long-term current use of insulin, unspecified whether stage 3a or 3b CKD ([place]) Continue hypoglycemic medications as already ordered, these medications have been reviewed and there are no changes at this time.  Hgb A1C to be monitored as already arranged by primary service     [name], MD  01/04/2021 2:10 PM

## 2021-01-06 DIAGNOSIS — R112 Nausea with vomiting, unspecified: Secondary | ICD-10-CM | POA: Diagnosis not present

## 2021-01-06 DIAGNOSIS — R630 Anorexia: Secondary | ICD-10-CM | POA: Diagnosis not present

## 2021-01-06 DIAGNOSIS — Z992 Dependence on renal dialysis: Secondary | ICD-10-CM | POA: Diagnosis not present

## 2021-01-06 DIAGNOSIS — R5383 Other fatigue: Secondary | ICD-10-CM | POA: Diagnosis not present

## 2021-01-06 DIAGNOSIS — N186 End stage renal disease: Secondary | ICD-10-CM | POA: Diagnosis not present

## 2021-01-07 DIAGNOSIS — Z992 Dependence on renal dialysis: Secondary | ICD-10-CM | POA: Diagnosis not present

## 2021-01-07 DIAGNOSIS — N186 End stage renal disease: Secondary | ICD-10-CM | POA: Diagnosis not present

## 2021-01-09 DIAGNOSIS — N186 End stage renal disease: Secondary | ICD-10-CM | POA: Diagnosis not present

## 2021-01-09 DIAGNOSIS — Z992 Dependence on renal dialysis: Secondary | ICD-10-CM | POA: Diagnosis not present

## 2021-01-12 DIAGNOSIS — R519 Headache, unspecified: Secondary | ICD-10-CM | POA: Diagnosis not present

## 2021-01-12 DIAGNOSIS — Z794 Long term (current) use of insulin: Secondary | ICD-10-CM | POA: Diagnosis not present

## 2021-01-12 DIAGNOSIS — Z992 Dependence on renal dialysis: Secondary | ICD-10-CM | POA: Diagnosis not present

## 2021-01-12 DIAGNOSIS — E119 Type 2 diabetes mellitus without complications: Secondary | ICD-10-CM | POA: Diagnosis not present

## 2021-01-12 DIAGNOSIS — N186 End stage renal disease: Secondary | ICD-10-CM | POA: Diagnosis not present

## 2021-01-12 DIAGNOSIS — R112 Nausea with vomiting, unspecified: Secondary | ICD-10-CM | POA: Diagnosis not present

## 2021-01-14 DIAGNOSIS — Z992 Dependence on renal dialysis: Secondary | ICD-10-CM | POA: Diagnosis not present

## 2021-01-14 DIAGNOSIS — N186 End stage renal disease: Secondary | ICD-10-CM | POA: Diagnosis not present

## 2021-01-15 ENCOUNTER — Telehealth (INDEPENDENT_AMBULATORY_CARE_PROVIDER_SITE_OTHER): Payer: Self-pay

## 2021-01-15 ENCOUNTER — Telehealth (HOSPITAL_COMMUNITY): Payer: Self-pay

## 2021-01-15 NOTE — Telephone Encounter (Signed)
Spoke with Celeste at [facility] to schedule the patient for a right brachial cephalic AV fistula with Dr. Delana Meyer. [facility] scheduled for [facility]23/21 at [facility]. Phone call pre-op on 01/19/21 between 1-5 pm and covid testing on 01/21/21 between 8-1 pm at the [facility]. Pre-surgical instructions will be faxed to [facility] at [facility].

## 2021-01-15 NOTE — Telephone Encounter (Signed)
Was able to get in touch with Terry Arroyo.  She has dialysis Mon, Wed, and Fridays.  Hard to get in touch with her between doctors and dialysis.  She states doing pretty good.  Set up appt for next week to visit in person.  She denies any problems at this time.  Will check for heart failure and medication compliance during visit.  Moms meals were started back last month.   She states she has everything at this time she needs for daily living.   [place] [phone]

## 2021-01-16 DIAGNOSIS — N186 End stage renal disease: Secondary | ICD-10-CM | POA: Diagnosis not present

## 2021-01-16 DIAGNOSIS — Z992 Dependence on renal dialysis: Secondary | ICD-10-CM | POA: Diagnosis not present

## 2021-01-19 ENCOUNTER — Other Ambulatory Visit
Admission: RE | Admit: 2021-01-19 | Discharge: 2021-01-19 | Disposition: A | Discharge status: Home / Self Care | Payer: Medicare HMO | Source: Ambulatory Visit | Attending: Vascular Surgery | Admitting: Vascular Surgery

## 2021-01-19 ENCOUNTER — Other Ambulatory Visit (INDEPENDENT_AMBULATORY_CARE_PROVIDER_SITE_OTHER): Payer: Self-pay | Admitting: Nurse Practitioner

## 2021-01-19 ENCOUNTER — Other Ambulatory Visit: Payer: Self-pay

## 2021-01-19 DIAGNOSIS — Z992 Dependence on renal dialysis: Secondary | ICD-10-CM | POA: Diagnosis not present

## 2021-01-19 DIAGNOSIS — N186 End stage renal disease: Secondary | ICD-10-CM | POA: Diagnosis not present

## 2021-01-19 HISTORY — DX: Atherosclerotic heart disease of native coronary artery without angina pectoris: I25.10

## 2021-01-19 HISTORY — DX: Unspecified asthma, uncomplicated: J45.909

## 2021-01-19 HISTORY — DX: Depression, unspecified: F32.A

## 2021-01-19 HISTORY — DX: Anemia, unspecified: D64.9

## 2021-01-19 HISTORY — DX: Chronic kidney disease, unspecified: N18.9

## 2021-01-19 HISTORY — DX: Anxiety disorder, unspecified: F41.9

## 2021-01-19 HISTORY — DX: Gastro-esophageal reflux disease without esophagitis: K21.9

## 2021-01-19 NOTE — Patient Instructions (Signed)
Your procedure is scheduled on: Friday January 23, 2021. Report to Day Surgery inside [facility] 2nd floor (stop by Admissions desk first (before getting on elevator). To find out your arrival time please call [phone] between 1PM - 3PM on Thursday January 22, 2021.  Remember: Instructions that are not followed completely may result in serious medical risk,  up to and including death, or upon the discretion of your surgeon and anesthesiologist your  surgery may need to be rescheduled.     _X__ 1. Do not eat food after midnight the night before your procedure.                 No chewing gum or hard candies. You may drink clear liquids up to 2 hours                 before you are scheduled to arrive for your surgery- DO not drink clear                 liquids within 2 hours of the start of your surgery.                 Clear Liquids include:  water, apple juice without pulp, clear Gatorade, G2 or                  Gatorade Zero (avoid Red/Purple/Blue), Black Coffee or Tea (Do not add                 anything to coffee or tea).  __X__2.  On the morning of surgery brush your teeth with toothpaste and water, you                may rinse your mouth with mouthwash if you wish.  Do not swallow any toothpaste of mouthwash.     _X__ 3.  No Alcohol for 24 hours before or after surgery.   _X__ 4.  Do Not Smoke or use e-cigarettes For 24 Hours Prior to Your Surgery.                 Do not use any chewable tobacco products for at least 6 hours prior to                 Surgery.  _X__  5.  Do not use any recreational drugs (marijuana, cocaine, heroin, ecstasy, MDMA or other)                For at least one week prior to your surgery.  Combination of these drugs with anesthesia                May have life threatening results.  __X__ 6.  Notify your doctor if there is any change in your medical condition      (cold, fever, infections).     Do not wear jewelry, make-up,  hairpins, clips or nail polish. Do not wear lotions, powders, or perfumes. . Do not shave 48 hours prior to surgery. Men may shave face and neck. Do not bring valuables to the hospital.    [facility] is not responsible for any belongings or valuables.  Contacts, dentures or bridgework may not be worn into surgery. Leave your suitcase in the car. After surgery it may be brought to your room. For patients admitted to the hospital, discharge time is determined by your treatment team.   Patients discharged the day of surgery will not be allowed to drive home.  Make arrangements for someone to be with you for the first 24 hours of your Same Day Discharge.   __X__ Take these medicines the morning of surgery with A SIP OF WATER:    1. amLODipine (NORVASC) 10 MG  2. carvedilol (COREG) 25 MG   3. isosorbide mononitrate (IMDUR) 30 MG  4. omeprazole (PRILOSEC) 20 MG    ____ Fleet Enema (as directed)   __X__ Use CHG Soap (or wipes) as directed  ____ Use Benzoyl Peroxide Gel as instructed  ____ Use inhalers on the day of surgery  ____ Stop metformin 2 days prior to surgery    __X__ Take 1/2 of usual insulin dose the night before surgery. No insulin the morning          of surgery. insulin aspart protamine- aspart (NOVOLOG MIX 70/30) (70-30) 100 UNIT/ML  __X__ Stop Anti-inflammatories such as Ibuprofen, Aleve, Advil, naproxen and or BC powders.    __X__ Stop supplements until after surgery.    __X__ Do not start any herbal supplements before your procedure.    If you have any questions regarding your pre-procedure instructions,  Please call Pre-admit Testing at [phone].

## 2021-01-19 NOTE — Pre-Procedure Instructions (Addendum)
TC to patient to inform that I will change the Covid test for tomorrow, since Dialysis is on Mon, Wed and Fri. Also, she needs to arrange for an adult to be with her after the surgery, she will contact her father and see if he can stay with her since she lives alone. Numa will bring her to the hospital and take her home. However an adult will need to be taking care of her at home. Patient ok with information given.

## 2021-01-20 ENCOUNTER — Telehealth: Payer: Self-pay | Admitting: Licensed Clinical Social Worker

## 2021-01-20 ENCOUNTER — Telehealth (HOSPITAL_COMMUNITY): Payer: Self-pay

## 2021-01-20 ENCOUNTER — Encounter
Admission: RE | Admit: 2021-01-20 | Discharge: 2021-01-20 | Disposition: A | Discharge status: Home / Self Care | Payer: Medicare HMO | Source: Ambulatory Visit | Attending: Vascular Surgery | Admitting: Vascular Surgery

## 2021-01-20 DIAGNOSIS — Z01818 Encounter for other preprocedural examination: Secondary | ICD-10-CM | POA: Insufficient documentation

## 2021-01-20 DIAGNOSIS — R799 Abnormal finding of blood chemistry, unspecified: Secondary | ICD-10-CM | POA: Diagnosis not present

## 2021-01-20 DIAGNOSIS — Z01810 Encounter for preprocedural cardiovascular examination: Secondary | ICD-10-CM | POA: Diagnosis not present

## 2021-01-20 LAB — BASIC METABOLIC PANEL
Anion gap: 11 (ref 5–15)
BUN: 24 mg/dL — ABNORMAL HIGH (ref 6–20)
CO2: 28 mmol/L (ref 22–32)
Calcium: 7.9 mg/dL — ABNORMAL LOW (ref 8.9–10.3)
Chloride: 102 mmol/L (ref 98–111)
Creatinine, Ser: 2.9 mg/dL — ABNORMAL HIGH (ref 0.44–1.00)
GFR, Estimated: 18 mL/min — ABNORMAL LOW (ref >60)
Glucose, Bld: 184 mg/dL — ABNORMAL HIGH (ref 70–99)
Potassium: 2.6 mmol/L — CL (ref 3.5–5.1)
Sodium: 141 mmol/L (ref 135–145)

## 2021-01-20 LAB — CBC WITH DIFFERENTIAL/PLATELET
Abs Immature Granulocytes: 0.02 10*3/uL (ref 0.00–0.07)
Basophils Absolute: 0.1 10*3/uL (ref 0.0–0.1)
Basophils Relative: 1 %
Eosinophils Absolute: 0.5 10*3/uL (ref 0.0–0.5)
Eosinophils Relative: 6 %
HCT: 35.2 % — ABNORMAL LOW (ref 36.0–46.0)
Hemoglobin: 11.3 g/dL — ABNORMAL LOW (ref 12.0–15.0)
Immature Granulocytes: 0 %
Lymphocytes Relative: 25 %
Lymphs Abs: 2.1 10*3/uL (ref 0.7–4.0)
MCH: 28.3 pg (ref 26.0–34.0)
MCHC: 32.1 g/dL (ref 30.0–36.0)
MCV: 88 fL (ref 80.0–100.0)
Monocytes Absolute: 0.8 10*3/uL (ref 0.1–1.0)
Monocytes Relative: 9 %
Neutro Abs: 4.9 10*3/uL (ref 1.7–7.7)
Neutrophils Relative %: 59 %
Platelets: 317 10*3/uL (ref 150–400)
RBC: 4 MIL/uL (ref 3.87–5.11)
RDW: 14 % (ref 11.5–15.5)
WBC: 8.3 10*3/uL (ref 4.0–10.5)
nRBC: 0 % (ref 0.0–0.2)

## 2021-01-20 LAB — TYPE AND SCREEN
ABO/RH(D): AB POS
Antibody Screen: NEGATIVE
Extend sample reason: TRANSFUSED

## 2021-01-20 LAB — PROTIME-INR
INR: 1.1 (ref 0.8–1.2)
Prothrombin Time: 13.4 seconds (ref 11.4–15.2)

## 2021-01-20 LAB — APTT: aPTT: 32 seconds (ref 24–36)

## 2021-01-20 NOTE — Telephone Encounter (Signed)
Had a home visit set up to visit with Terry Arroyo today.  Noticed she has preop appt today.  Contacted her and said she meant to call me, she states she has too much going on today then she needs to quarentine.  She states doing ok, trying to get this surgery done.  She states her food stamps lapsed when she was in the hospital, she is trying to get them started back up.  Will ask if we can get moms meals for her til she gets food stamps.  She states will not need both.  She states will take about a month to get them.  She has all her medications and everything else for daily living. Will visit next week for heart failure.   Terry Arroyo [phone]

## 2021-01-20 NOTE — Progress Notes (Signed)
  [facility] Perioperative Services: Pre-Admission/Anesthesia Testing  Abnormal Lab Notification   Date: 01/20/21  Name: Terry Arroyo MRN:   [id number]  Re: Abnormal labs noted during PAT appointment   Provider(s) Notified: Schnier, [name] Lory, MD and Eulogio Ditch, NP-C Notification mode: Routed and/or faxed via [place] LAB VALUE(S): Lab Results  Component Value Date   K 2.6 (LL) 01/20/2021    Notes:  Patient is scheduled for a ARTERIOVENOUS (AV) FISTULA CREATION (BRACHIAL CEPHALIC POSSIBLE BASILIC) (Right ) on 39/53/2023. Patient is on daily furosemide and torsemide therapy per her medication records. Forwarding results to surgeon and his APP for review and optimization. Will place orders to have K+ rechecked on day of procedure to ensure correction of noted derangement. This is a Community education officer; no formal response is required.  Honor Loh, MSN, APRN, FNP-C, CEN [facility]  Peri-operative Services Nurse Practitioner Phone: [phone] Fax: [phone] 01/20/21 1:42 PM

## 2021-01-20 NOTE — Telephone Encounter (Signed)
Patient identified as a candidate to receive 7 heart healthy meals per week for 4 weeks through [facility].  Completed referral sent in for review.  Anticipate patient will receive first shipment of food in 1-3 business days.  Terry Arroyo H. Terry Highland, LCSW Clinical Social Worker [facility] Desk#: [phone] Cell#: [phone]  

## 2021-01-21 ENCOUNTER — Other Ambulatory Visit: Payer: Medicare HMO

## 2021-01-21 ENCOUNTER — Encounter: Payer: Self-pay | Admitting: Vascular Surgery

## 2021-01-21 DIAGNOSIS — Z992 Dependence on renal dialysis: Secondary | ICD-10-CM | POA: Diagnosis not present

## 2021-01-21 DIAGNOSIS — N186 End stage renal disease: Secondary | ICD-10-CM | POA: Diagnosis not present

## 2021-01-21 NOTE — Progress Notes (Signed)
[facility] Perioperative Services  Pre-Admission/Anesthesia Testing Clinical Review  Date: 01/27/21  Patient Demographics:  Name: Terry Arroyo DOB:   Jul 28, 1961 MRN:   [id number]  Planned Surgical Procedure(s):    Case: [id number] Date/Time: 01/28/21 0830   Procedure: ARTERIOVENOUS (AV) FISTULA CREATION (BRACHIAL CEPHALIC POSSIBLE BASILIC) (Right )   Anesthesia type: General   Pre-op diagnosis: ESRD   Location: [facility] OR ROOM 07 / [place] ORS FOR ANESTHESIA GROUP   Surgeons: Katha Cabal, MD    NOTE: Available PAT nursing documentation and vital signs have been reviewed. Clinical nursing staff has updated patient's PMH/PSHx, current medication list, and drug allergies/intolerances to ensure comprehensive history available to assist in medical decision making as it pertains to the aforementioned surgical procedure and anticipated anesthetic course.   Clinical Discussion:  Terry Arroyo is a 60 y.o. female who is submitted for pre-surgical anesthesia review and clearance prior to her undergoing the above procedure. Patient is a Former Smoker (quit 2018). Pertinent PMH includes: angina at rest, CAD (s/p CABG), MI (2018), anterior cerebral aneurysm (left MCA), CVA (2014), HFrEF, HTN, HLD, T2DM, DVT, ESRD, COPD, asthma, OSAH (non-compliant with nocturnal PAP therapy), GERD (on daily PPI therapy), pituitary tumor, anxiety, depression, BLIND in left eye.   Patient with a significant medical history as noted above.  In review of her EMR, it was noted the patient has had multiple hospital admissions for chest pain shortness of breath. During her admission in 01/2020, patient underwent myocardial perfusion imaging study that revealed severely reduced left ventricular systolic function; EF <45%.  Imaging demonstrated a medium defect of moderate severity consistent with prior myocardial infarction with peri-infarct ischemia. Patient last seen by cardiology Clayborn Bigness, MD) during  hospital admission in 10/2020. At that time, invasive cardiac studies were not recommended due to renal insufficiency.  TTE performed on 11/02/2020 revealed inferior hypokinesis likely secondary to CAD, and mildly decreased left ventricular systolic function; EF 80-99%.  There was mild to moderate valvular insufficiency noted (see full interpretation of cardiovascular testing below). Patient with progressive renal disease (ESRD) in 10/2020 and was started on hemodialysis.   In preparation for the above procedure, given patient's complex medical history significant for cardiovascular disease/diagnoses, presurgical cardiac clearance was sought by the PAT team.  Of note, patient has not been seen by a local cardiologist since admission to [facility] back in 10/2020.  Call placed to [facility] clinic cardiology to secure appointment for patient be seen by attending cardiologist prior to her upcoming procedure.  Patient able to be seen on 01-27-2021 at 1400 by Dr. Lujean Amel, which is the last provider who saw her in the hospital.  Call placed to patient to notify her of the aforementioned appointment details with cardiology.  She was instructed to call PAT if unable to keep appointment.  Patient was seen and preoperative consult by cardiology Clayborn Bigness, MD) on 01/27/2021.  Clinical note pending dictation, however it is noted the cardiology has plans to have patient undergo further noninvasive cardiovascular testing via TTE and myocardial perfusion imaging study.  These studies are scheduled for 03/05/2021. Per cardiology, "this patient is optimized for surgery and may proceed with the planned procedural course with a LOW risk stratification".  This patient is on daily antiplatelet therapy.  She has been instructed on recommendations from vascular surgery and cardiology for continuing her daily low-dose ASA throughout the perioperative course.  She denies previous perioperative complications with anesthesia.  Vitals  with BMI 01/19/2021 01/01/2021 11/12/2020  Height 5'  6" 5\' 6"  -  Weight 180 lbs 174 lbs 3 oz -  BMI 16.96 78.93 -  Systolic - 810 175  Diastolic - 77 78  Pulse - 66 61    Providers/Specialists:   NOTE: Primary physician provider listed below. Patient may have been seen by APP or partner within same practice.   PROVIDER ROLE / SPECIALTY LAST Anne Hahn, [name] Lory, MD Vascular Surgery  01/01/2021  Kirk Ruths, MD Primary Care Provider  11/27/2020  Katrine Coho, MD Cardiology  01/27/2021   Allergies:  Hydralazine, Shellfish allergy, Kiwi extract, Metformin, and Tape  Current Home Medications:   Arroyo current facility-administered medications for this encounter.    amLODipine (NORVASC) 10 MG tablet   ammonium lactate (LAC-HYDRIN) 12 % lotion   aspirin 81 MG EC tablet   atorvastatin (LIPITOR) 80 MG tablet   carvedilol (COREG) 25 MG tablet   furosemide (LASIX) 40 MG tablet   insulin aspart protamine- aspart (NOVOLOG MIX 70/30) (70-30) 100 UNIT/ML injection   isosorbide mononitrate (IMDUR) 30 MG 24 hr tablet   lisinopril (ZESTRIL) 30 MG tablet   mirtazapine (REMERON) 30 MG tablet   mometasone-formoterol (DULERA) 200-5 MCG/ACT AERO   omeprazole (PRILOSEC) 20 MG capsule   topiramate (TOPAMAX) 25 MG tablet   torsemide (DEMADEX) 10 MG tablet   History:   Past Medical History:  Diagnosis Date   Anemia    Anxiety    Asthma    Blind left eye    Brain tumor ([place]) 1986   COPD (chronic obstructive pulmonary disease) (HCC)    Coronary artery disease    Depression    Diabetes mellitus without complication (HCC)    ESRD (end stage renal disease) ([place])    GERD (gastroesophageal reflux disease)    HFrEF (heart failure with reduced ejection fraction) ([place])    History of 2019 novel coronavirus disease (COVID-19) 03/2019   [place]   History of DVT (deep vein thrombosis)    Hx of CABG    Hyperlipidemia     Hypertension    MI (myocardial infarction) ([place]) 2018   Sleep apnea    non-compliant with nocturnal PAP therapy   Stroke [facility]) 2014   stated affected left side.   Past Surgical History:  Procedure Laterality Date   BRAIN SURGERY  1986   pituitary tumor   BREAST EXCISIONAL BIOPSY Left yrs ago   benign   CESAREAN SECTION     CORONARY ARTERY BYPASS GRAFT  2018   done in [place] N/A 11/07/2020   Procedure: DIALYSIS/PERMA CATHETER INSERTION;  Surgeon: Algernon Huxley, MD;  Location: [place] CV LAB;  Service: Cardiovascular;  Laterality: N/A;   EYE SURGERY Right 2021   cataracts    Family History  Problem Relation Age of Onset   CAD Mother    CAD Brother    Breast cancer Sister 90   Social History   Tobacco Use   Smoking status: Former Smoker    Quit date: 2018    Years since quitting: 4.1   Smokeless tobacco: Never Used  Scientific laboratory technician Use: Never used  Substance Use Topics   Alcohol use: Not Currently   Drug use: Not Currently    Pertinent Clinical Results:  LABS:    Hypokalemia noted.  Results forwarded to primary attending surgeon for review and optimization.  K+ level to be rechecked on day of surgery; order has been entered.  Hospital Outpatient Visit on 01/26/2021  Component Date Value Ref Range Status   SARS Coronavirus 2 01/26/2021 NEGATIVE  NEGATIVE Final   Comment: (NOTE) SARS-CoV-2 target nucleic acids are NOT DETECTED.  The SARS-CoV-2 RNA is generally detectable in upper and lower respiratory specimens during the acute phase of infection. Negative results do not preclude SARS-CoV-2 infection, do not rule out co-infections with other pathogens, and should not be used as the sole basis for treatment or other patient management decisions. Negative results must be combined with clinical observations, patient history, and epidemiological information. The expected result is Negative.  Fact  Sheet for Patients: SugarRoll.be  Fact Sheet for Healthcare Providers: https://www.woods-mathews.com/  This test is not yet approved or cleared by the [place] FDA and  has been authorized for detection and/or diagnosis of SARS-CoV-2 by FDA under an Emergency Use Authorization (EUA). This EUA will remain  in effect (meaning this test can be used) for the duration of the COVID-19 declaration under Se                          ction 564(b)(1) of the Act, 21 U.S.C. section 360bbb-3(b)(1), unless the authorization is terminated or revoked sooner.  Performed at [facility], [place] [address], [place], [place] [address]   Hospital Outpatient Visit on 01/20/2021  Component Date Value Ref Range Status   WBC 01/20/2021 8.3  4.0 - 10.5 K/uL Final   RBC 01/20/2021 4.00  3.87 - 5.11 MIL/uL Final   Hemoglobin 01/20/2021 11.3* 12.0 - 15.0 g/dL Final   HCT 01/20/2021 35.2* 36.0 - 46.0 % Final   MCV 01/20/2021 88.0  80.0 - 100.0 fL Final   MCH 01/20/2021 28.3  26.0 - 34.0 pg Final   MCHC 01/20/2021 32.1  30.0 - 36.0 g/dL Final   RDW 01/20/2021 14.0  11.5 - 15.5 % Final   Platelets 01/20/2021 317  150 - 400 K/uL Final   nRBC 01/20/2021 0.0  0.0 - 0.2 % Final   Neutrophils Relative % 01/20/2021 59  % Final   Neutro Abs 01/20/2021 4.9  1.7 - 7.7 K/uL Final   Lymphocytes Relative 01/20/2021 25  % Final   Lymphs Abs 01/20/2021 2.1  0.7 - 4.0 K/uL Final   Monocytes Relative 01/20/2021 9  % Final   Monocytes Absolute 01/20/2021 0.8  0.1 - 1.0 K/uL Final   Eosinophils Relative 01/20/2021 6  % Final   Eosinophils Absolute 01/20/2021 0.5  0.0 - 0.5 K/uL Final   Basophils Relative 01/20/2021 1  % Final   Basophils Absolute 01/20/2021 0.1  0.0 - 0.1 K/uL Final   Immature Granulocytes 01/20/2021 0  % Final   Abs Immature Granulocytes 01/20/2021 0.02  0.00 - 0.07 K/uL Final   Performed at [facility], [place], [place] [address]   Sodium 01/20/2021 141  135 - 145 mmol/L Final   Potassium 01/20/2021 2.6* 3.5 - 5.1 mmol/L Final   Comment: CRITICAL RESULT CALLED TO, READ BACK BY AND VERIFIED WITH Geovannie Vilar NP AT 1338 ON 01/20/21 SNG    Chloride 01/20/2021 102  98 - 111 mmol/L Final   CO2 01/20/2021 28  22 - 32 mmol/L Final   Glucose, Bld 01/20/2021 184* 70 - 99 mg/dL Final   Glucose reference range applies only to samples taken after fasting for at least 8 hours.   BUN 01/20/2021 24* 6 - 20 mg/dL Final   Creatinine, Ser 01/20/2021 2.90*  0.44 - 1.00 mg/dL Final   Calcium 01/20/2021 7.9* 8.9 - 10.3 mg/dL Final   GFR, Estimated 01/20/2021 18* >60 mL/min Final   Comment: (NOTE) Calculated using the CKD-EPI Creatinine Equation (2021)    Anion gap 01/20/2021 11  5 - 15 Final   Performed at [facility], [address] [place], [place] [address]   Prothrombin Time 01/20/2021 13.4  11.4 - 15.2 seconds Final   INR 01/20/2021 1.1  0.8 - 1.2 Final   Comment: (NOTE) INR goal varies based on device and disease states. Performed at [facility], [address] [place], [place] [address]    aPTT 01/20/2021 32  24 - 36 seconds Final   Performed at [facility], [address] [place], [place] [address]   ABO/RH(D) 01/20/2021 AB POS   Final   Antibody Screen 01/20/2021 NEG   Final   Sample Expiration 01/20/2021 01/23/2021,2359   Final   Extend sample reason 01/20/2021    Final                   Value:TRANSFUSED IN PAST 3 MONTHS, UNABLE TO EXTEND Performed at [facility], [address] [place], [place] [address]      ECG: Date: 01/20/2021 Time ECG obtained: 1023 AM Rate: 58 bpm Rhythm: sinus bradycardia Intervals: PR 132 ms. QRS 104 ms. QTc 463 ms. ST segment and T wave changes: Lateral ST abnormality  Comparison: Similar to previous tracing obtained on 11/01/2020   IMAGING / PROCEDURES: VASCULAR UPPER EXTREMITY DUPLEX STUDY  performed on 01/01/2021 1. Arroyo obstruction visualized in the right upper extremity 2. Arroyo fracture visualized in left upper extremity  LEXISCAN performed on 01/22/2020 1. Severely decreased left ventricular systolic function; LVEF <10% 2. There is a medium defect of moderate severity present in the basal inferoseptal, basal inferior, basal inferolateral, mid inferoseptal, mid inferior, mid inferolateral, and apical inferior location.  Findings consistent with prior myocardial infarction with peri-infarct ischemia 3. There was Arroyo evidence of stress-induced arrhythmias  4. Intermediate risk study  DIAGNOSTIC RADIOGRAPHS OF CHEST performed on 11/09/2020 1. Mild stable cardiomegaly with suggestion of minimal vascular congestion 2. Possible small amount of bilateral pleural fluid/pleuroparenchymal scarring  ECHOCARDIOGRAM performed on 11/02/2020 1. Inferior Hypokinesis likely secondary to CAD.  2. Left ventricular ejection fraction, by estimation, is 40 to 45%.  3. Left ventricular ejection fraction by 2D MOD biplane is 44.8 %.  4. The left ventricle has mildly decreased function.  5. The left ventricle demonstrates regional wall motion abnormalities. 6. The left ventricular internal cavity size was mildly dilated.  7. Left ventricular diastolic parameters were normal.  8. Right ventricular systolic function is normal.  9. The right ventricular size is normal.  10. The mitral valve is grossly normal. Mild to moderate mitral valve regurgitation.  11. The aortic valve is grossly normal. Aortic valve regurgitation is not visualized.   Impression and Plan:  Terry Arroyo has been referred for pre-anesthesia review and clearance prior to her undergoing the planned anesthetic and procedural courses. Available labs, pertinent testing, and imaging results were personally reviewed by me. This patient has been appropriately cleared by cardiology with an overall LOW risk stratification for significant  perioperative cardiovascular complications.   Patient with noted hypokalemia.  Potassium level 2.6 mmol/L.  Primary attending surgeon Delana Meyer, MD) and his APP Owens Shark, NP-C) were made aware.  Order has been placed for K+ to be rechecked on the day of surgery.  With that being  said, based on clinical review performed today (01/27/21), barring any significant acute changes in the patient's overall condition, it is anticipated that she will be able to proceed with the planned surgical intervention. Any acute changes in clinical condition may necessitate her procedure being postponed and/or cancelled. Pre-surgical instructions were reviewed with the patient during her PAT appointment and questions were fielded by PAT clinical staff.  Honor Loh, MSN, APRN, FNP-C, CEN [facility]  Peri-operative Services Nurse Practitioner Phone: [phone] 01/27/21 2:12 PM  NOTE: This note has been prepared using Dragon dictation software. Despite my best ability to proofread, there is always the potential that unintentional transcriptional errors may still occur from this process.

## 2021-01-23 DIAGNOSIS — N186 End stage renal disease: Secondary | ICD-10-CM | POA: Diagnosis not present

## 2021-01-23 DIAGNOSIS — Z992 Dependence on renal dialysis: Secondary | ICD-10-CM | POA: Diagnosis not present

## 2021-01-26 ENCOUNTER — Other Ambulatory Visit: Payer: Self-pay

## 2021-01-26 ENCOUNTER — Other Ambulatory Visit
Admission: RE | Admit: 2021-01-26 | Discharge: 2021-01-26 | Disposition: A | Discharge status: Home / Self Care | Payer: Medicare HMO | Source: Ambulatory Visit | Attending: Vascular Surgery | Admitting: Vascular Surgery

## 2021-01-26 DIAGNOSIS — Z01812 Encounter for preprocedural laboratory examination: Secondary | ICD-10-CM | POA: Diagnosis not present

## 2021-01-26 DIAGNOSIS — N186 End stage renal disease: Secondary | ICD-10-CM | POA: Diagnosis not present

## 2021-01-26 DIAGNOSIS — Z992 Dependence on renal dialysis: Secondary | ICD-10-CM | POA: Diagnosis not present

## 2021-01-26 DIAGNOSIS — Z20822 Contact with and (suspected) exposure to covid-19: Secondary | ICD-10-CM | POA: Insufficient documentation

## 2021-01-26 LAB — SARS CORONAVIRUS 2 (TAT 6-24 HRS): SARS Coronavirus 2: NEGATIVE

## 2021-01-27 ENCOUNTER — Encounter: Payer: Self-pay | Admitting: Vascular Surgery

## 2021-01-27 DIAGNOSIS — I10 Essential (primary) hypertension: Secondary | ICD-10-CM | POA: Diagnosis not present

## 2021-01-27 DIAGNOSIS — R0602 Shortness of breath: Secondary | ICD-10-CM | POA: Diagnosis not present

## 2021-01-27 DIAGNOSIS — G4733 Obstructive sleep apnea (adult) (pediatric): Secondary | ICD-10-CM | POA: Diagnosis not present

## 2021-01-27 DIAGNOSIS — E1159 Type 2 diabetes mellitus with other circulatory complications: Secondary | ICD-10-CM | POA: Diagnosis not present

## 2021-01-27 DIAGNOSIS — F330 Major depressive disorder, recurrent, mild: Secondary | ICD-10-CM | POA: Diagnosis not present

## 2021-01-27 DIAGNOSIS — I208 Other forms of angina pectoris: Secondary | ICD-10-CM | POA: Diagnosis not present

## 2021-01-27 DIAGNOSIS — I2510 Atherosclerotic heart disease of native coronary artery without angina pectoris: Secondary | ICD-10-CM | POA: Diagnosis not present

## 2021-01-27 DIAGNOSIS — I671 Cerebral aneurysm, nonruptured: Secondary | ICD-10-CM | POA: Diagnosis not present

## 2021-01-27 DIAGNOSIS — I25708 Atherosclerosis of coronary artery bypass graft(s), unspecified, with other forms of angina pectoris: Secondary | ICD-10-CM | POA: Diagnosis not present

## 2021-01-28 ENCOUNTER — Encounter
Admission: RE | Disposition: A | Discharge status: Home / Self Care | Payer: Self-pay | Source: Home / Self Care | Attending: Vascular Surgery

## 2021-01-28 ENCOUNTER — Ambulatory Visit: Payer: Medicare HMO | Admitting: Urgent Care

## 2021-01-28 ENCOUNTER — Ambulatory Visit
Admission: RE | Admit: 2021-01-28 | Discharge: 2021-01-28 | Disposition: A | Discharge status: Home / Self Care | Payer: Medicare HMO | Attending: Vascular Surgery | Admitting: Vascular Surgery

## 2021-01-28 ENCOUNTER — Other Ambulatory Visit: Payer: Self-pay

## 2021-01-28 DIAGNOSIS — E782 Mixed hyperlipidemia: Secondary | ICD-10-CM | POA: Diagnosis not present

## 2021-01-28 DIAGNOSIS — N186 End stage renal disease: Secondary | ICD-10-CM | POA: Insufficient documentation

## 2021-01-28 DIAGNOSIS — I132 Hypertensive heart and chronic kidney disease with heart failure and with stage 5 chronic kidney disease, or end stage renal disease: Secondary | ICD-10-CM | POA: Diagnosis not present

## 2021-01-28 DIAGNOSIS — Z8673 Personal history of transient ischemic attack (TIA), and cerebral infarction without residual deficits: Secondary | ICD-10-CM | POA: Insufficient documentation

## 2021-01-28 DIAGNOSIS — E1122 Type 2 diabetes mellitus with diabetic chronic kidney disease: Secondary | ICD-10-CM | POA: Diagnosis not present

## 2021-01-28 DIAGNOSIS — Z8249 Family history of ischemic heart disease and other diseases of the circulatory system: Secondary | ICD-10-CM | POA: Diagnosis not present

## 2021-01-28 DIAGNOSIS — E785 Hyperlipidemia, unspecified: Secondary | ICD-10-CM | POA: Diagnosis not present

## 2021-01-28 DIAGNOSIS — N185 Chronic kidney disease, stage 5: Secondary | ICD-10-CM | POA: Diagnosis not present

## 2021-01-28 DIAGNOSIS — D631 Anemia in chronic kidney disease: Secondary | ICD-10-CM | POA: Diagnosis not present

## 2021-01-28 DIAGNOSIS — Z79899 Other long term (current) drug therapy: Secondary | ICD-10-CM | POA: Diagnosis not present

## 2021-01-28 DIAGNOSIS — I252 Old myocardial infarction: Secondary | ICD-10-CM | POA: Diagnosis not present

## 2021-01-28 DIAGNOSIS — I509 Heart failure, unspecified: Secondary | ICD-10-CM | POA: Insufficient documentation

## 2021-01-28 DIAGNOSIS — Z888 Allergy status to other drugs, medicaments and biological substances status: Secondary | ICD-10-CM | POA: Diagnosis not present

## 2021-01-28 DIAGNOSIS — Z794 Long term (current) use of insulin: Secondary | ICD-10-CM | POA: Insufficient documentation

## 2021-01-28 DIAGNOSIS — Z7982 Long term (current) use of aspirin: Secondary | ICD-10-CM | POA: Diagnosis not present

## 2021-01-28 DIAGNOSIS — R739 Hyperglycemia, unspecified: Secondary | ICD-10-CM | POA: Diagnosis not present

## 2021-01-28 DIAGNOSIS — I5023 Acute on chronic systolic (congestive) heart failure: Secondary | ICD-10-CM | POA: Diagnosis not present

## 2021-01-28 DIAGNOSIS — I739 Peripheral vascular disease, unspecified: Secondary | ICD-10-CM | POA: Diagnosis not present

## 2021-01-28 DIAGNOSIS — Z87891 Personal history of nicotine dependence: Secondary | ICD-10-CM | POA: Diagnosis not present

## 2021-01-28 DIAGNOSIS — J449 Chronic obstructive pulmonary disease, unspecified: Secondary | ICD-10-CM | POA: Diagnosis not present

## 2021-01-28 DIAGNOSIS — H5462 Unqualified visual loss, left eye, normal vision right eye: Secondary | ICD-10-CM | POA: Insufficient documentation

## 2021-01-28 DIAGNOSIS — I2510 Atherosclerotic heart disease of native coronary artery without angina pectoris: Secondary | ICD-10-CM | POA: Diagnosis not present

## 2021-01-28 DIAGNOSIS — I25118 Atherosclerotic heart disease of native coronary artery with other forms of angina pectoris: Secondary | ICD-10-CM | POA: Diagnosis not present

## 2021-01-28 DIAGNOSIS — I130 Hypertensive heart and chronic kidney disease with heart failure and stage 1 through stage 4 chronic kidney disease, or unspecified chronic kidney disease: Secondary | ICD-10-CM | POA: Diagnosis not present

## 2021-01-28 HISTORY — DX: Presence of aortocoronary bypass graft: Z95.1

## 2021-01-28 HISTORY — DX: End stage renal disease: N18.6

## 2021-01-28 HISTORY — DX: Other forms of angina pectoris: I20.89

## 2021-01-28 HISTORY — PX: AV FISTULA PLACEMENT: SHX1204

## 2021-01-28 HISTORY — DX: Chronic obstructive pulmonary disease, unspecified: J44.9

## 2021-01-28 HISTORY — DX: Benign neoplasm of pituitary gland: D35.2

## 2021-01-28 HISTORY — DX: Sleep apnea, unspecified: G47.30

## 2021-01-28 HISTORY — DX: Unspecified systolic (congestive) heart failure: I50.20

## 2021-01-28 HISTORY — DX: Other forms of angina pectoris: I20.8

## 2021-01-28 HISTORY — DX: Personal history of other venous thrombosis and embolism: Z86.718

## 2021-01-28 HISTORY — DX: Cerebral aneurysm, nonruptured: I67.1

## 2021-01-28 LAB — TYPE AND SCREEN
ABO/RH(D): AB POS
Antibody Screen: NEGATIVE

## 2021-01-28 LAB — COMPREHENSIVE METABOLIC PANEL
ALT: 10 U/L (ref 0–44)
AST: 12 U/L — ABNORMAL LOW (ref 15–41)
Albumin: 2.6 g/dL — ABNORMAL LOW (ref 3.5–5.0)
Alkaline Phosphatase: 80 U/L (ref 38–126)
Anion gap: 12 (ref 5–15)
BUN: 25 mg/dL — ABNORMAL HIGH (ref 6–20)
CO2: 21 mmol/L — ABNORMAL LOW (ref 22–32)
Calcium: 8.3 mg/dL — ABNORMAL LOW (ref 8.9–10.3)
Chloride: 102 mmol/L (ref 98–111)
Creatinine, Ser: 3.77 mg/dL — ABNORMAL HIGH (ref 0.44–1.00)
GFR, Estimated: 13 mL/min — ABNORMAL LOW (ref >60)
Glucose, Bld: 320 mg/dL — ABNORMAL HIGH (ref 70–99)
Potassium: 3.3 mmol/L — ABNORMAL LOW (ref 3.5–5.1)
Sodium: 135 mmol/L (ref 135–145)
Total Bilirubin: 0.4 mg/dL (ref 0.3–1.2)
Total Protein: 6.6 g/dL (ref 6.5–8.1)

## 2021-01-28 LAB — POCT I-STAT, CHEM 8
BUN: 24 mg/dL — ABNORMAL HIGH (ref 6–20)
Calcium, Ion: 1.13 mmol/L — ABNORMAL LOW (ref 1.15–1.40)
Chloride: 104 mmol/L (ref 98–111)
Creatinine, Ser: 4 mg/dL — ABNORMAL HIGH (ref 0.44–1.00)
Glucose, Bld: 316 mg/dL — ABNORMAL HIGH (ref 70–99)
HCT: 30 % — ABNORMAL LOW (ref 36.0–46.0)
Hemoglobin: 10.2 g/dL — ABNORMAL LOW (ref 12.0–15.0)
Potassium: 3.3 mmol/L — ABNORMAL LOW (ref 3.5–5.1)
Sodium: 137 mmol/L (ref 135–145)
TCO2: 22 mmol/L (ref 22–32)

## 2021-01-28 LAB — GLUCOSE, CAPILLARY
Glucose-Capillary: 309 mg/dL — ABNORMAL HIGH (ref 70–99)
Glucose-Capillary: 293 mg/dL — ABNORMAL HIGH (ref 70–99)

## 2021-01-28 SURGERY — ARTERIOVENOUS (AV) FISTULA CREATION
Anesthesia: General | Laterality: Right

## 2021-01-28 MED ORDER — BUPIVACAINE LIPOSOME 1.3 % IJ SUSP
INTRAMUSCULAR | Status: DC | PRN
  Administered 2021-01-28: 12.5 mL

## 2021-01-28 MED ORDER — ORAL CARE MOUTH RINSE: 15.0000 mL | Freq: Once | OROMUCOSAL | Status: DC

## 2021-01-28 MED ORDER — HEPARIN SODIUM (PORCINE) 5000 UNIT/ML IJ SOLN
INTRAMUSCULAR | Status: AC
  Filled 2021-01-28: qty 1

## 2021-01-28 MED ORDER — FENTANYL CITRATE (PF) 100 MCG/2ML IJ SOLN
INTRAMUSCULAR | Status: AC
  Filled 2021-01-28: qty 2

## 2021-01-28 MED ORDER — SODIUM CHLORIDE 0.9 % IV SOLN: INTRAVENOUS | Status: DC

## 2021-01-28 MED ORDER — CHLORHEXIDINE GLUCONATE CLOTH 2 % EX PADS: 6.0000 | MEDICATED_PAD | Freq: Once | CUTANEOUS | Status: DC

## 2021-01-28 MED ORDER — HYDROCODONE-ACETAMINOPHEN 5-325 MG PO TABS: 1.0000 | ORAL_TABLET | Freq: Four times a day (QID) | ORAL | 0 refills | Status: DC | PRN

## 2021-01-28 MED ORDER — NALOXONE HCL 0.4 MG/ML IJ SOLN
INTRAMUSCULAR | Status: DC | PRN
Start: 2021-01-28 — End: 2021-01-28
  Administered 2021-01-28: 40 ug via INTRAVENOUS
  Administered 2021-01-28: 50 ug via INTRAVENOUS
  Administered 2021-01-28: 100 ug via INTRAVENOUS

## 2021-01-28 MED ORDER — CEFAZOLIN SODIUM-DEXTROSE 1-4 GM/50ML-% IV SOLN
INTRAVENOUS | Status: AC
  Filled 2021-01-28: qty 50

## 2021-01-28 MED ORDER — BUPIVACAINE HCL (PF) 0.5 % IJ SOLN
INTRAMUSCULAR | Status: AC
  Filled 2021-01-28: qty 30

## 2021-01-28 MED ORDER — SUCCINYLCHOLINE CHLORIDE 20 MG/ML IJ SOLN
INTRAMUSCULAR | Status: DC | PRN
  Administered 2021-01-28: 40 mg via INTRAVENOUS

## 2021-01-28 MED ORDER — NALOXONE HCL 0.4 MG/ML IJ SOLN
INTRAMUSCULAR | Status: AC
  Filled 2021-01-28: qty 1

## 2021-01-28 MED ORDER — HEMOSTATIC AGENTS (NO CHARGE) OPTIME
TOPICAL | Status: DC | PRN
  Administered 2021-01-28: 1 via TOPICAL

## 2021-01-28 MED ORDER — ONDANSETRON HCL 4 MG/2ML IJ SOLN: 4.0000 mg | Freq: Once | INTRAMUSCULAR | Status: DC | PRN

## 2021-01-28 MED ORDER — SODIUM CHLORIDE 0.9 % IV SOLN
INTRAVENOUS | Status: DC | PRN
  Administered 2021-01-28: 200 mL via INTRAMUSCULAR

## 2021-01-28 MED ORDER — PROPOFOL 10 MG/ML IV BOLUS
INTRAVENOUS | Status: AC
  Filled 2021-01-28: qty 20

## 2021-01-28 MED ORDER — EPHEDRINE SULFATE 50 MG/ML IJ SOLN
INTRAMUSCULAR | Status: DC | PRN
Start: 2021-01-28 — End: 2021-01-28
  Administered 2021-01-28 (×2): 5 mg via INTRAVENOUS
  Administered 2021-01-28: 15 mg via INTRAVENOUS

## 2021-01-28 MED ORDER — CEFAZOLIN SODIUM-DEXTROSE 1-4 GM/50ML-% IV SOLN
1.0000 g | INTRAVENOUS | Status: AC
  Administered 2021-01-28: 1 g via INTRAVENOUS

## 2021-01-28 MED ORDER — ONDANSETRON HCL 4 MG/2ML IJ SOLN
INTRAMUSCULAR | Status: DC | PRN
  Administered 2021-01-28: 4 mg via INTRAVENOUS

## 2021-01-28 MED ORDER — FENTANYL CITRATE (PF) 100 MCG/2ML IJ SOLN: 25.0000 ug | INTRAMUSCULAR | Status: DC | PRN

## 2021-01-28 MED ORDER — PROPOFOL 10 MG/ML IV BOLUS
INTRAVENOUS | Status: DC | PRN
  Administered 2021-01-28: 100 mg via INTRAVENOUS
  Administered 2021-01-28: 30 mg via INTRAVENOUS

## 2021-01-28 MED ORDER — EPINEPHRINE 1 MG/10ML IJ SOSY
PREFILLED_SYRINGE | INTRAMUSCULAR | Status: DC | PRN
  Administered 2021-01-28: .01 mg via INTRAVENOUS
  Administered 2021-01-28: .02 mg via INTRAVENOUS

## 2021-01-28 MED ORDER — BUPIVACAINE HCL (PF) 0.5 % IJ SOLN
INTRAMUSCULAR | Status: DC | PRN
  Administered 2021-01-28: 12.5 mL

## 2021-01-28 MED ORDER — BUPIVACAINE LIPOSOME 1.3 % IJ SUSP
INTRAMUSCULAR | Status: AC
  Filled 2021-01-28: qty 20

## 2021-01-28 MED ORDER — PHENYLEPHRINE HCL-NACL 20-0.9 MG/250ML-% IV SOLN
INTRAVENOUS | Status: DC | PRN
  Administered 2021-01-28: 20 ug/min via INTRAVENOUS

## 2021-01-28 MED ORDER — PAPAVERINE HCL 30 MG/ML IJ SOLN
INTRAMUSCULAR | Status: AC
  Filled 2021-01-28: qty 2

## 2021-01-28 MED ORDER — CHLORHEXIDINE GLUCONATE 0.12 % MT SOLN: 15.0000 mL | Freq: Once | OROMUCOSAL | Status: DC

## 2021-01-28 MED ORDER — LACTATED RINGERS IV SOLN: INTRAVENOUS | Status: DC | PRN

## 2021-01-28 MED ORDER — FENTANYL CITRATE (PF) 100 MCG/2ML IJ SOLN
INTRAMUSCULAR | Status: DC | PRN
  Administered 2021-01-28: 25 ug via INTRAVENOUS
  Administered 2021-01-28: 50 ug via INTRAVENOUS
  Administered 2021-01-28 (×4): 25 ug via INTRAVENOUS

## 2021-01-28 SURGICAL SUPPLY — 59 items
ADH SKN CLS APL DERMABOND .7 (GAUZE/BANDAGES/DRESSINGS) ×1
APL PRP STRL LF DISP 70% ISPRP (MISCELLANEOUS) ×1
APPLIER CLIP 11 MED OPEN (CLIP)
APPLIER CLIP 9.375 SM OPEN (CLIP)
APR CLP MED 11 20 MLT OPN (CLIP)
APR CLP SM 9.3 20 MLT OPN (CLIP)
BAG DECANTER FOR FLEXI CONT (MISCELLANEOUS) ×2 IMPLANT
BLADE SURG SZ11 CARB STEEL (BLADE) ×2 IMPLANT
BOOT SUTURE AID YELLOW STND (SUTURE) ×2 IMPLANT
BRUSH SCRUB EZ  4% CHG (MISCELLANEOUS) ×1
BRUSH SCRUB EZ 4% CHG (MISCELLANEOUS) ×1 IMPLANT
CANISTER SUCT 1200ML W/VALVE (MISCELLANEOUS) ×2 IMPLANT
CHLORAPREP W/TINT 26 (MISCELLANEOUS) ×2 IMPLANT
CLIP APPLIE 11 MED OPEN (CLIP) IMPLANT
CLIP APPLIE 9.375 SM OPEN (CLIP) IMPLANT
COVER WAND RF STERILE (DRAPES) ×2 IMPLANT
DERMABOND ADVANCED (GAUZE/BANDAGES/DRESSINGS) ×1
DERMABOND ADVANCED .7 DNX12 (GAUZE/BANDAGES/DRESSINGS) ×1 IMPLANT
DRESSING SURGICEL FIBRLLR 1X2 (HEMOSTASIS) ×1 IMPLANT
DRSG SURGICEL FIBRILLAR 1X2 (HEMOSTASIS) ×2
ELECT CAUTERY BLADE 6.4 (BLADE) ×2 IMPLANT
ELECT REM PT RETURN 9FT ADLT (ELECTROSURGICAL) ×2
ELECTRODE REM PT RTRN 9FT ADLT (ELECTROSURGICAL) ×1 IMPLANT
GEL ULTRASOUND 20GR AQUASONIC (MISCELLANEOUS) IMPLANT
GLOVE INDICATOR 7.5 STRL GRN (GLOVE) ×2 IMPLANT
GLOVE SURG ENC MOIS LTX SZ7 (GLOVE) ×2 IMPLANT
GLOVE SURG SYN 8.0 (GLOVE) ×2 IMPLANT
GOWN STRL REUS W/ TWL LRG LVL3 (GOWN DISPOSABLE) ×2 IMPLANT
GOWN STRL REUS W/ TWL XL LVL3 (GOWN DISPOSABLE) ×1 IMPLANT
GOWN STRL REUS W/TWL LRG LVL3 (GOWN DISPOSABLE) ×4
GOWN STRL REUS W/TWL XL LVL3 (GOWN DISPOSABLE) ×2
IV NS 500ML (IV SOLUTION) ×2
IV NS 500ML BAXH (IV SOLUTION) ×1 IMPLANT
KIT TURNOVER KIT A (KITS) ×2 IMPLANT
LABEL OR SOLS (LABEL) ×2 IMPLANT
LOOP RED MAXI  1X406MM (MISCELLANEOUS) ×1
LOOP VESSEL MAXI 1X406 RED (MISCELLANEOUS) ×1 IMPLANT
LOOP VESSEL MINI 0.8X406 BLUE (MISCELLANEOUS) ×2 IMPLANT
LOOPS BLUE MINI 0.8X406MM (MISCELLANEOUS) ×2
MANIFOLD NEPTUNE II (INSTRUMENTS) ×2 IMPLANT
NEEDLE FILTER BLUNT 18X 1/2SAF (NEEDLE) ×1
NEEDLE FILTER BLUNT 18X1 1/2 (NEEDLE) ×1 IMPLANT
NEEDLE HYPO 30X.5 LL (NEEDLE) IMPLANT
PACK EXTREMITY ARMC (MISCELLANEOUS) ×2 IMPLANT
PAD PREP 24X41 OB/GYN DISP (PERSONAL CARE ITEMS) ×2 IMPLANT
STOCKINETTE STRL 4IN 9604848 (GAUZE/BANDAGES/DRESSINGS) ×2 IMPLANT
SUT MNCRL+ 5-0 UNDYED PC-3 (SUTURE) ×1 IMPLANT
SUT MONOCRYL 5-0 (SUTURE) ×1
SUT PROLENE 6 0 BV (SUTURE) ×8 IMPLANT
SUT SILK 2 0 (SUTURE) ×2
SUT SILK 2-0 18XBRD TIE 12 (SUTURE) ×1 IMPLANT
SUT SILK 3 0 (SUTURE) ×2
SUT SILK 3-0 18XBRD TIE 12 (SUTURE) ×1 IMPLANT
SUT SILK 4 0 (SUTURE) ×2
SUT SILK 4-0 18XBRD TIE 12 (SUTURE) ×1 IMPLANT
SUT VIC AB 3-0 SH 27 (SUTURE) ×2
SUT VIC AB 3-0 SH 27X BRD (SUTURE) ×1 IMPLANT
SYR 20ML LL LF (SYRINGE) ×2 IMPLANT
SYR 3ML LL SCALE MARK (SYRINGE) ×2 IMPLANT

## 2021-01-28 NOTE — Transfer of Care (Signed)
Immediate Anesthesia Transfer of Care Note  Patient: Terry Arroyo  Procedure(s) Performed: ARTERIOVENOUS (AV) FISTULA CREATION (Right )  Patient Location: PACU  Anesthesia Type:General  Level of Consciousness: awake  Airway & Oxygen Therapy: Patient Spontanous Breathing and Patient connected to face mask oxygen  Post-op Assessment: Report given to RN and Post -op Vital signs reviewed and stable  Post vital signs: stable  Last Vitals:  Vitals Value Taken Time  BP 155/81 01/28/21 1007  Temp    Pulse 66 01/28/21 1013  Resp 14 01/28/21 1013  SpO2 100 % 01/28/21 1013  Vitals shown include unvalidated device data.  Last Pain:  Vitals:   01/28/21 0722  PainSc: 0-No pain         Complications: No complications documented.

## 2021-01-28 NOTE — Discharge Instructions (Signed)
AMBULATORY SURGERY  DISCHARGE INSTRUCTIONS   1) The drugs that you were given will stay in your system until tomorrow so for the next 24 hours you should not:  A) Drive an automobile B) Make any legal decisions C) Drink any alcoholic beverage   2) You may resume regular meals tomorrow.  Today it is better to start with liquids and gradually work up to solid foods.  You may eat anything you prefer, but it is better to start with liquids, then soup and crackers, and gradually work up to solid foods.   3) Please notify your doctor immediately if you have any unusual bleeding, trouble breathing, redness and pain at the surgery site, drainage, fever, or pain not relieved by medication.  4) Your post-operative visit with Dr.                                     is: Date:                        Time:    Please call to schedule your post-operative visit.  5) Additional Instructions:   Bupivacaine Liposomal Suspension for Injection What is this medicine? BUPIVACAINE LIPOSOMAL (bue PIV a kane LIP oh som al) is an anesthetic. It causes loss of feeling in the skin or other tissues. It is used to prevent and to treat pain from some procedures. This medicine may be used for other purposes; ask your health care provider or pharmacist if you have questions. COMMON BRAND NAME(S): EXPAREL What should I tell my health care provider before I take this medicine? They need to know if you have any of these conditions:  G6PD deficiency  heart disease  kidney disease  liver disease  low blood pressure  lung or breathing disease, like asthma  an unusual or allergic reaction to bupivacaine, other medicines, foods, dyes, or preservatives  pregnant or trying to get pregnant  breast-feeding How should I use this medicine? This medicine is injected into the affected area. It is given by a health care provider in a hospital or clinic setting. Talk to your health care provider about the use of  this medicine in children. While it may be given to children as young as 6 years for selected conditions, precautions do apply. Overdosage: If you think you have taken too much of this medicine contact a poison control center or emergency room at once. NOTE: This medicine is only for you. Do not share this medicine with others. What if I miss a dose? This does not apply. What may interact with this medicine? This medicine may interact with the following medications:  acetaminophen  certain antibiotics like dapsone, nitrofurantoin, aminosalicylic acid, sulfonamides  certain medicines for seizures like phenobarbital, phenytoin, valproic acid  chloroquine  cyclophosphamide  flutamide  hydroxyurea  ifosfamide  metoclopramide  nitric oxide  nitroglycerin  nitroprusside  nitrous oxide  other local anesthetics like lidocaine, pramoxine, tetracaine  primaquine  quinine  rasburicase  sulfasalazine This list may not describe all possible interactions. Give your health care provider a list of all the medicines, herbs, non-prescription drugs, or dietary supplements you use. Also tell them if you smoke, drink alcohol, or use illegal drugs. Some items may interact with your medicine. What should I watch for while using this medicine? Your condition will be monitored carefully while you are receiving this medicine. Be careful to  avoid injury while the area is numb, and you are not aware of pain. What side effects may I notice from receiving this medicine? Side effects that you should report to your doctor or health care professional as soon as possible:  allergic reactions like skin rash, itching or hives, swelling of the face, lips, or tongue  seizures  signs and symptoms of a dangerous change in heartbeat or heart rhythm like chest pain; dizziness; fast, irregular heartbeat; palpitations; feeling faint or lightheaded; falls; breathing problems  signs and symptoms of  methemoglobinemia such as pale, gray, or blue colored skin; headache; fast heartbeat; shortness of breath; feeling faint or lightheaded, falls; tiredness Side effects that usually do not require medical attention (report to your doctor or health care professional if they continue or are bothersome):  anxious  back pain  changes in taste  changes in vision  constipation  dizziness  fever  nausea, vomiting This list may not describe all possible side effects. Call your doctor for medical advice about side effects. You may report side effects to FDA at 1-800-FDA-1088. Where should I keep my medicine? This drug is given in a hospital or clinic and will not be stored at home. NOTE: This sheet is a summary. It may not cover all possible information. If you have questions about this medicine, talk to your doctor, pharmacist, or health care provider.  2021 Elsevier/Gold Standard (2020-03-13 12:24:57)

## 2021-01-28 NOTE — Anesthesia Postprocedure Evaluation (Signed)
Anesthesia Post Note  Patient: Terry Arroyo  Procedure(s) Performed: ARTERIOVENOUS (AV) FISTULA CREATION (Right )  Patient location during evaluation: PACU Anesthesia Type: General Level of consciousness: awake and alert Pain management: pain level controlled Vital Signs Assessment: post-procedure vital signs reviewed and stable Respiratory status: spontaneous breathing, nonlabored ventilation, respiratory function stable and patient connected to nasal cannula oxygen Cardiovascular status: blood pressure returned to baseline and stable Postop Assessment: no apparent nausea or vomiting Anesthetic complications: no   No complications documented.   Last Vitals:  Vitals:   01/28/21 1045 01/28/21 1101  BP: (!) 143/74 137/71  Pulse: 65 63  Resp: 12 10  Temp:  (!) 36.4 C  SpO2: 95% 93%    Last Pain:  Vitals:   01/28/21 1101  TempSrc: Oral  PainSc: 0-No pain                 Molli Barrows

## 2021-01-28 NOTE — Interval H&P Note (Signed)
History and Physical Interval Note:  01/28/2021 7:35 AM  Terry Arroyo  has presented today for surgery, with the diagnosis of ESRD.  The various methods of treatment have been discussed with the patient and family. After consideration of risks, benefits and other options for treatment, the patient has consented to  Procedure(s): ARTERIOVENOUS (AV) FISTULA CREATION (BRACHIAL CEPHALIC POSSIBLE BASILIC) (Right) as a surgical intervention.  The patient's history has been reviewed, patient examined, no change in status, stable for surgery.  I have reviewed the patient's chart and labs.  Questions were answered to the patient's satisfaction.     [name]

## 2021-01-28 NOTE — Anesthesia Procedure Notes (Signed)
Procedure Name: Intubation Date/Time: 01/28/2021 8:07 AM Performed by: Natasha Mead, CRNA Pre-anesthesia Checklist: Patient identified, Emergency Drugs available, Suction available and Patient being monitored Patient Re-evaluated:Patient Re-evaluated prior to induction Oxygen Delivery Method: Circle system utilized Preoxygenation: Pre-oxygenation with 100% oxygen Induction Type: IV induction Ventilation: Mask ventilation without difficulty Laryngoscope Size: Mac and 3 Grade View: Grade II Tube type: Oral Tube size: 7.0 mm Number of attempts: 1 Airway Equipment and Method: Stylet Placement Confirmation: ETT inserted through vocal cords under direct vision,  positive ETCO2 and breath sounds checked- equal and bilateral Tube secured with: Tape Dental Injury: Teeth and Oropharynx as per pre-operative assessment

## 2021-01-28 NOTE — Progress Notes (Signed)
Patient comes from PACU. Patient is having some drowsiness. Got Narcan during surgery. Patient has to wait 90 minutes after narcan administration. Patient tolerating diet ginger ale.

## 2021-01-28 NOTE — Op Note (Signed)
     OPERATIVE NOTE   PROCEDURE: right brachial cephalic arteriovenous fistula placement  PRE-OPERATIVE DIAGNOSIS: End Stage Renal Disease  POST-OPERATIVE DIAGNOSIS: End Stage Renal Disease  SURGEON: [name]  ASSISTANT(S): None  ANESTHESIA: general  ESTIMATED BLOOD LOSS: <50 cc  FINDING(S): 3.5 mm cephalic vein  SPECIMEN(S):  none  INDICATIONS:   Terry Arroyo is a 60 y.o. female who presents with end stage renal disease.  The patient is scheduled for right brachiocephalic arteriovenous fistula placement.  The patient is aware the risks include but are not limited to: bleeding, infection, steal syndrome, nerve damage, ischemic monomelic neuropathy, failure to mature, and need for additional procedures.  The patient is aware of the risks of the procedure and elects to proceed forward.  DESCRIPTION: After full informed written consent was obtained from the patient, the patient was brought back to the operating room and placed supine upon the operating table.  Prior to induction, the patient received IV antibiotics.   After obtaining adequate anesthesia, the patient was then prepped and draped in the standard fashion for a right arm access procedure.   A first assistant was required to provide a safe and appropriate environment for executing the surgery.  The assistant was integral in providing retraction, exposure, running suture providing suction and in the closing process.   A curvilinear incision was then created midway between the radial impulse and the cephalic vein. The cephalic vein was then identified and dissected circumferentially. It was marked with a surgical marker.    Attention was then turned to the brachial artery which was exposed through the same incision and looped proximally and distally. Side branches were controlled with 4-0 silk ties.  The distal segment of the vein was ligated with a  2-0 silk, and the vein was transected.  The proximal segment  was interrogated with serial dilators.  The vein accepted up to a 3.5 mm dilator without any difficulty. Heparinized saline was infused into the vein and clamped it with a small bulldog.  At this point, I reset my exposure of the brachial artery and controlled the artery with vessel loops proximally and distally.  An arteriotomy was then made with a #11 blade, and extended with a Potts scissor.  Heparinized saline was injected proximal and distal into the radial artery.  The vein was then approximated to the artery while the artery was in its native bed and subsequently the vein was beveled using Potts scissors. The vein was then sewn to the artery in an end-to-side configuration with a running stitch of 6-0 Prolene.  Prior to completing this anastomosis Flushing maneuvers were performed and the artery was allowed to forward and back bleed.  There was no evidence of clot from any vessels.  I completed the anastomosis in the usual fashion and then released all vessel loops and clamps.    There was good  thrill in the venous outflow, and there was 1+ palpable radial pulse.  At this point, I irrigated out the surgical wound.  There was no further active bleeding.  The subcutaneous tissue was reapproximated with a running stitch of 3-0 Vicryl.  The skin was then reapproximated with a running subcuticular stitch of 4-0 Vicryl.  The skin was then cleaned, dried, and reinforced with Dermabond.    The patient tolerated this procedure well.   COMPLICATIONS: None  CONDITION: Margaret[facility] D[facility]  Office: [phone]   01/28/2021, 9:45 AM

## 2021-01-28 NOTE — Anesthesia Preprocedure Evaluation (Signed)
Anesthesia Evaluation  Patient identified by MRN, date of birth, ID band Patient awake    Reviewed: Allergy & Precautions, H&P , NPO status , Patient's Chart, lab work & pertinent test results, reviewed documented beta blocker date and time   Airway Mallampati: II  TM Distance: >3 FB Neck ROM: full    Dental  (+) Upper Dentures, Lower Dentures   Pulmonary asthma , sleep apnea , pneumonia, resolved, COPD, former smoker,    Pulmonary exam normal        Cardiovascular Exercise Tolerance: Poor hypertension, On Medications + angina with exertion + CAD, + Past MI, + Peripheral Vascular Disease and +CHF  Normal cardiovascular exam Rate:Normal     Neuro/Psych  Headaches, PSYCHIATRIC DISORDERS Anxiety Depression  Neuromuscular disease CVA    GI/Hepatic Neg liver ROS, GERD  Medicated,  Endo/Other  negative endocrine ROSdiabetes, Poorly Controlled, Type 1, Insulin Dependent  Renal/GU ESRF and DialysisRenal disease  negative genitourinary   Musculoskeletal   Abdominal   Peds  Hematology  (+) Blood dyscrasia, anemia ,   Anesthesia Other Findings   Reproductive/Obstetrics negative OB ROS                             Anesthesia Physical Anesthesia Plan  ASA: IV  Anesthesia Plan: General LMA   Post-op Pain Management:    Induction:   PONV Risk Score and Plan: 4 or greater  Airway Management Planned:   Additional Equipment:   Intra-op Plan:   Post-operative Plan:   Informed Consent: I have reviewed the patients History and Physical, chart, labs and discussed the procedure including the risks, benefits and alternatives for the proposed anesthesia with the patient or authorized representative who has indicated his/her understanding and acceptance.       Plan Discussed with: CRNA  Anesthesia Plan Comments:         Anesthesia Quick Evaluation

## 2021-01-29 ENCOUNTER — Encounter: Payer: Self-pay | Admitting: Vascular Surgery

## 2021-01-30 ENCOUNTER — Telehealth (INDEPENDENT_AMBULATORY_CARE_PROVIDER_SITE_OTHER): Payer: Self-pay

## 2021-01-30 DIAGNOSIS — N186 End stage renal disease: Secondary | ICD-10-CM | POA: Diagnosis not present

## 2021-01-30 DIAGNOSIS — Z992 Dependence on renal dialysis: Secondary | ICD-10-CM | POA: Diagnosis not present

## 2021-01-30 NOTE — Telephone Encounter (Signed)
Patient called stating she had surgery for a fistula placement with Dr. Lucky Cowboy on 01/28/21 and has some redness and swelling of her arm. I advised that the redness and swelling are all normal, after being cut for surgery this will happen. I asked if she had any other symptoms or if the swelling and redness is bad and she stated she did not have other symptoms and the swelling and redness is not bad. Patient stated she called because the paperwork she was given stated to call if she had redness or swelling.

## 2021-02-02 DIAGNOSIS — Z992 Dependence on renal dialysis: Secondary | ICD-10-CM | POA: Diagnosis not present

## 2021-02-02 DIAGNOSIS — N186 End stage renal disease: Secondary | ICD-10-CM | POA: Diagnosis not present

## 2021-02-03 ENCOUNTER — Encounter (HOSPITAL_COMMUNITY): Payer: Self-pay

## 2021-02-03 ENCOUNTER — Other Ambulatory Visit (HOSPITAL_COMMUNITY): Payer: Self-pay

## 2021-02-03 NOTE — Progress Notes (Signed)
Today had a home visit with Terry Arroyo.  She states she is doing ok.  She states the last year has been really hectic and gets overwhelmed a lot.  She is healing from her fistula being placed last week.  She has been going to her dialysis appts.  She has everything for daily living.  She states [place] has not come back out, she states came once.  She states she just needs an aid to help with daily chores.  She has been getting [employer] meals, she states she does have her food stamps now and will no longer need them. She also has a healthy food card from her insurance.  She is aware of up coming appts.  She has all her medications and gets them delivered through [place].  She is able to set up her transportation for herself to get places.  She wants to be able to get around and be more active.  She states appetite is not good, eats small meals, she states always feels full.  She weighs daily.  She watches her diet and fluids.  She checks her sugars daily.  She is able to see much better.  She denies any problems today such as chest pain, headaches, dizziness or increased shortness of breath.  Will continue to visit for heart failure, diet and medication compliance.   [place] [phone]

## 2021-02-04 DIAGNOSIS — N186 End stage renal disease: Secondary | ICD-10-CM | POA: Diagnosis not present

## 2021-02-04 DIAGNOSIS — Z992 Dependence on renal dialysis: Secondary | ICD-10-CM | POA: Diagnosis not present

## 2021-02-05 NOTE — Telephone Encounter (Signed)
Patient called stating that her swelling has gone down but she would like to come in to be seen. Patient hasn't had post op visit scheduled ; I made appt.   This note is for documentation purposes only.

## 2021-02-06 ENCOUNTER — Encounter: Payer: Self-pay | Admitting: Vascular Surgery

## 2021-02-06 DIAGNOSIS — N186 End stage renal disease: Secondary | ICD-10-CM | POA: Diagnosis not present

## 2021-02-06 DIAGNOSIS — Z992 Dependence on renal dialysis: Secondary | ICD-10-CM | POA: Diagnosis not present

## 2021-02-09 ENCOUNTER — Emergency Department: Payer: Medicare HMO

## 2021-02-09 ENCOUNTER — Observation Stay
Admission: EM | Admit: 2021-02-09 | Discharge: 2021-02-11 | Disposition: A | Discharge status: Home / Self Care | Payer: Medicare HMO | Attending: Internal Medicine | Admitting: Internal Medicine

## 2021-02-09 ENCOUNTER — Other Ambulatory Visit: Payer: Self-pay

## 2021-02-09 DIAGNOSIS — I2510 Atherosclerotic heart disease of native coronary artery without angina pectoris: Secondary | ICD-10-CM | POA: Diagnosis not present

## 2021-02-09 DIAGNOSIS — J810 Acute pulmonary edema: Secondary | ICD-10-CM

## 2021-02-09 DIAGNOSIS — Z7901 Long term (current) use of anticoagulants: Secondary | ICD-10-CM | POA: Insufficient documentation

## 2021-02-09 DIAGNOSIS — E1122 Type 2 diabetes mellitus with diabetic chronic kidney disease: Secondary | ICD-10-CM | POA: Insufficient documentation

## 2021-02-09 DIAGNOSIS — Z8616 Personal history of COVID-19: Secondary | ICD-10-CM | POA: Diagnosis not present

## 2021-02-09 DIAGNOSIS — I132 Hypertensive heart and chronic kidney disease with heart failure and with stage 5 chronic kidney disease, or end stage renal disease: Secondary | ICD-10-CM | POA: Insufficient documentation

## 2021-02-09 DIAGNOSIS — R600 Localized edema: Secondary | ICD-10-CM | POA: Diagnosis not present

## 2021-02-09 DIAGNOSIS — Z87891 Personal history of nicotine dependence: Secondary | ICD-10-CM | POA: Insufficient documentation

## 2021-02-09 DIAGNOSIS — Z20822 Contact with and (suspected) exposure to covid-19: Secondary | ICD-10-CM | POA: Diagnosis not present

## 2021-02-09 DIAGNOSIS — N186 End stage renal disease: Secondary | ICD-10-CM | POA: Diagnosis not present

## 2021-02-09 DIAGNOSIS — E119 Type 2 diabetes mellitus without complications: Secondary | ICD-10-CM

## 2021-02-09 DIAGNOSIS — Z8673 Personal history of transient ischemic attack (TIA), and cerebral infarction without residual deficits: Secondary | ICD-10-CM | POA: Diagnosis not present

## 2021-02-09 DIAGNOSIS — R0602 Shortness of breath: Secondary | ICD-10-CM | POA: Diagnosis not present

## 2021-02-09 DIAGNOSIS — J90 Pleural effusion, not elsewhere classified: Secondary | ICD-10-CM

## 2021-02-09 DIAGNOSIS — I25118 Atherosclerotic heart disease of native coronary artery with other forms of angina pectoris: Secondary | ICD-10-CM | POA: Diagnosis not present

## 2021-02-09 DIAGNOSIS — J439 Emphysema, unspecified: Secondary | ICD-10-CM | POA: Diagnosis present

## 2021-02-09 DIAGNOSIS — Z992 Dependence on renal dialysis: Secondary | ICD-10-CM | POA: Diagnosis not present

## 2021-02-09 DIAGNOSIS — Z7982 Long term (current) use of aspirin: Secondary | ICD-10-CM | POA: Insufficient documentation

## 2021-02-09 DIAGNOSIS — E1169 Type 2 diabetes mellitus with other specified complication: Secondary | ICD-10-CM

## 2021-02-09 DIAGNOSIS — R072 Precordial pain: Secondary | ICD-10-CM | POA: Diagnosis not present

## 2021-02-09 DIAGNOSIS — I5043 Acute on chronic combined systolic (congestive) and diastolic (congestive) heart failure: Secondary | ICD-10-CM | POA: Diagnosis not present

## 2021-02-09 DIAGNOSIS — D631 Anemia in chronic kidney disease: Secondary | ICD-10-CM | POA: Diagnosis not present

## 2021-02-09 DIAGNOSIS — J449 Chronic obstructive pulmonary disease, unspecified: Secondary | ICD-10-CM | POA: Diagnosis not present

## 2021-02-09 DIAGNOSIS — F32A Depression, unspecified: Secondary | ICD-10-CM | POA: Diagnosis not present

## 2021-02-09 DIAGNOSIS — I10 Essential (primary) hypertension: Secondary | ICD-10-CM | POA: Diagnosis not present

## 2021-02-09 DIAGNOSIS — N2581 Secondary hyperparathyroidism of renal origin: Secondary | ICD-10-CM | POA: Diagnosis not present

## 2021-02-09 DIAGNOSIS — Z794 Long term (current) use of insulin: Secondary | ICD-10-CM | POA: Diagnosis not present

## 2021-02-09 DIAGNOSIS — R079 Chest pain, unspecified: Secondary | ICD-10-CM | POA: Diagnosis not present

## 2021-02-09 DIAGNOSIS — I213 ST elevation (STEMI) myocardial infarction of unspecified site: Secondary | ICD-10-CM | POA: Diagnosis not present

## 2021-02-09 DIAGNOSIS — Z79899 Other long term (current) drug therapy: Secondary | ICD-10-CM | POA: Diagnosis not present

## 2021-02-09 DIAGNOSIS — I517 Cardiomegaly: Secondary | ICD-10-CM | POA: Diagnosis not present

## 2021-02-09 DIAGNOSIS — R0789 Other chest pain: Secondary | ICD-10-CM | POA: Diagnosis not present

## 2021-02-09 LAB — BASIC METABOLIC PANEL
Anion gap: 10 (ref 5–15)
BUN: 41 mg/dL — ABNORMAL HIGH (ref 6–20)
CO2: 24 mmol/L (ref 22–32)
Calcium: 9.1 mg/dL (ref 8.9–10.3)
Chloride: 102 mmol/L (ref 98–111)
Creatinine, Ser: 3.71 mg/dL — ABNORMAL HIGH (ref 0.44–1.00)
GFR, Estimated: 14 mL/min — ABNORMAL LOW (ref >60)
Glucose, Bld: 299 mg/dL — ABNORMAL HIGH (ref 70–99)
Potassium: 3.7 mmol/L (ref 3.5–5.1)
Sodium: 136 mmol/L (ref 135–145)

## 2021-02-09 LAB — CBC
HCT: 35.2 % — ABNORMAL LOW (ref 36.0–46.0)
Hemoglobin: 11.2 g/dL — ABNORMAL LOW (ref 12.0–15.0)
MCH: 27.7 pg (ref 26.0–34.0)
MCHC: 31.8 g/dL (ref 30.0–36.0)
MCV: 87.1 fL (ref 80.0–100.0)
Platelets: 378 10*3/uL (ref 150–400)
RBC: 4.04 MIL/uL (ref 3.87–5.11)
RDW: 13.9 % (ref 11.5–15.5)
WBC: 9.4 10*3/uL (ref 4.0–10.5)
nRBC: 0 % (ref 0.0–0.2)

## 2021-02-09 LAB — HEMOGLOBIN A1C
Hgb A1c MFr Bld: 8.9 % — ABNORMAL HIGH (ref 4.8–5.6)
Mean Plasma Glucose: 208.73 mg/dL

## 2021-02-09 LAB — RESP PANEL BY RT-PCR (FLU A&B, COVID) ARPGX2
Influenza A by PCR: NEGATIVE
Influenza B by PCR: NEGATIVE
SARS Coronavirus 2 by RT PCR: NEGATIVE

## 2021-02-09 LAB — TROPONIN I (HIGH SENSITIVITY)
Troponin I (High Sensitivity): 20 ng/L — ABNORMAL HIGH (ref <18)
Troponin I (High Sensitivity): 19 ng/L — ABNORMAL HIGH (ref <18)

## 2021-02-09 LAB — CBG MONITORING, ED: Glucose-Capillary: 194 mg/dL — ABNORMAL HIGH (ref 70–99)

## 2021-02-09 LAB — HIV ANTIBODY (ROUTINE TESTING W REFLEX): HIV Screen 4th Generation wRfx: NONREACTIVE

## 2021-02-09 IMAGING — CR DG CHEST 2V
2 series · 2 of 2 positions shown · non-contrast
Comparison: Chest radiograph [DATE]

CLINICAL DATA: Patient arrives from dialysis with midsternal chest
pain, did not receive dialysis treatment. Elevated blood pressure
and shortness of breath x2 days

EXAM:
CHEST - 2 VIEW

[chest pa]
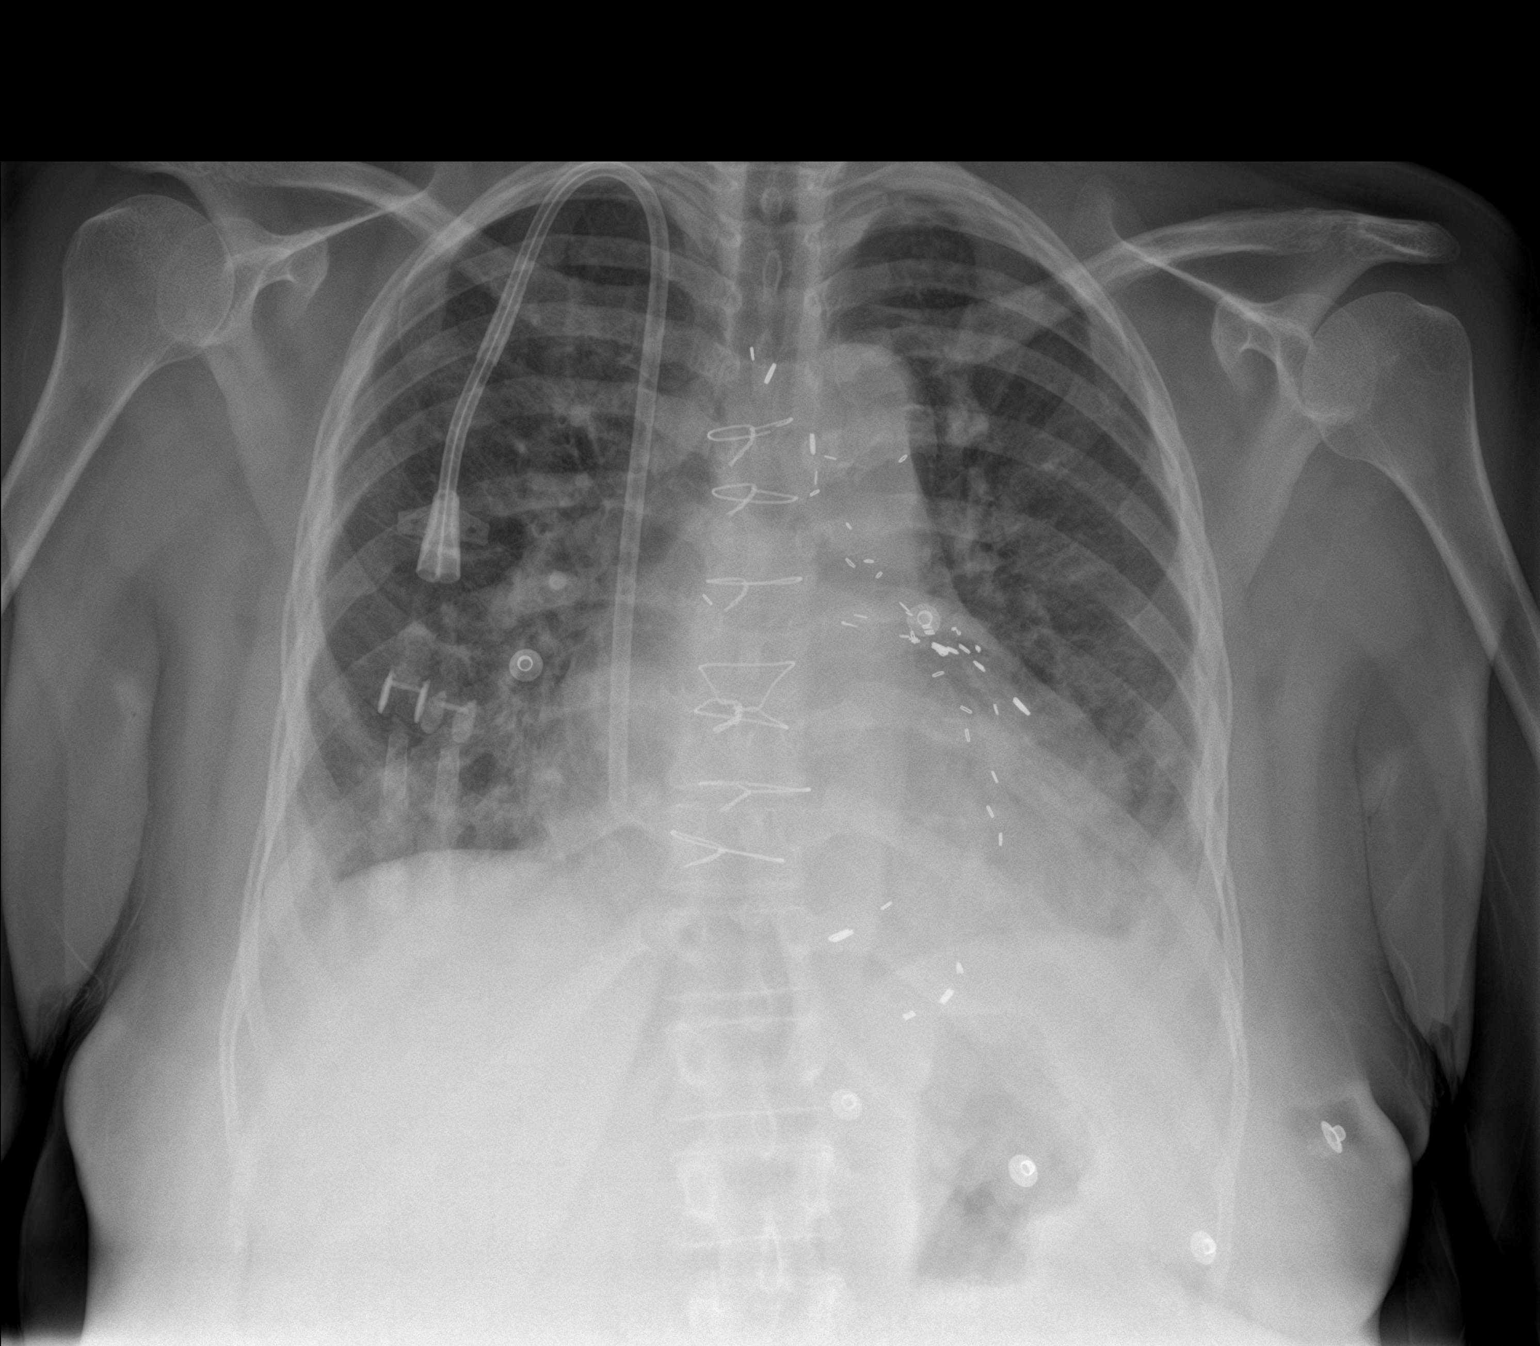

[chest lat]
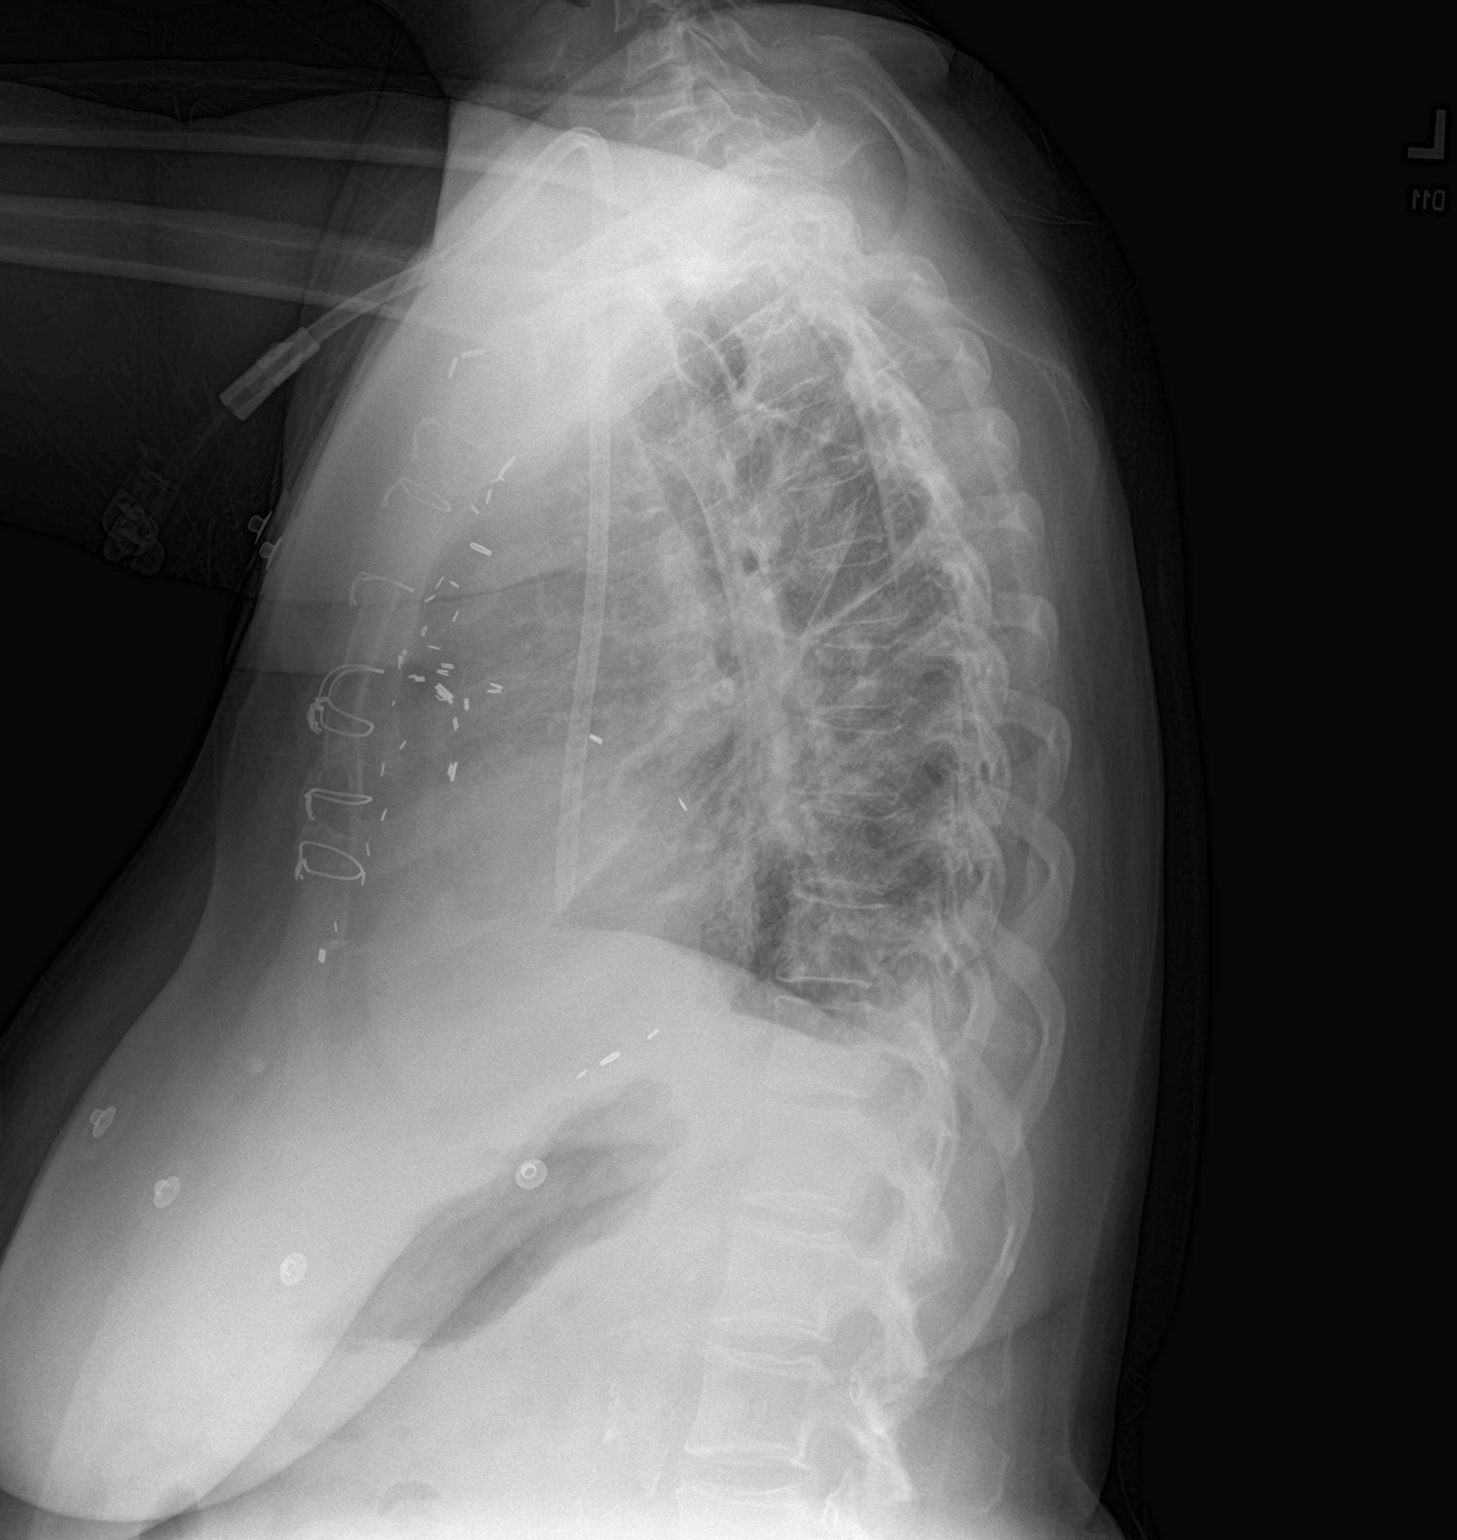

[2 of 2 positions shown; findings below may reference images not displayed]

FINDINGS: Right IJ central venous catheter with tip overlying the right
atrium. Stable mild cardiomegaly. Surgical clips overlie the
mediastinum. Left greater than right basilar and perihilar
opacities. Tiny bilateral pleural effusions. The visualized skeletal
structures are unchanged.
IMPRESSION: Cardiomegaly with likely pulmonary edema and tiny bilateral
effusions.

## 2021-02-09 MED ORDER — AMMONIUM LACTATE 12 % EX LOTN
1.0000 "application " | TOPICAL_LOTION | CUTANEOUS | Status: DC | PRN
  Filled 2021-02-09: qty 400

## 2021-02-09 MED ORDER — SODIUM CHLORIDE 0.9% FLUSH: 3.0000 mL | INTRAVENOUS | Status: DC | PRN

## 2021-02-09 MED ORDER — CHLORHEXIDINE GLUCONATE CLOTH 2 % EX PADS
6.0000 | MEDICATED_PAD | Freq: Every day | CUTANEOUS | Status: DC
  Administered 2021-02-11: 6 via TOPICAL
  Filled 2021-02-09: qty 6

## 2021-02-09 MED ORDER — INSULIN ASPART 100 UNIT/ML ~~LOC~~ SOLN
0.0000 [IU] | Freq: Three times a day (TID) | SUBCUTANEOUS | Status: DC
  Administered 2021-02-09 – 2021-02-10 (×2): 3 [IU] via SUBCUTANEOUS
  Administered 2021-02-10 (×2): 5 [IU] via SUBCUTANEOUS
  Administered 2021-02-11: 3 [IU] via SUBCUTANEOUS
  Administered 2021-02-11: 11 [IU] via SUBCUTANEOUS
  Filled 2021-02-09 (×6): qty 1

## 2021-02-09 MED ORDER — SODIUM CHLORIDE 0.9% FLUSH
3.0000 mL | Freq: Two times a day (BID) | INTRAVENOUS | Status: DC
  Administered 2021-02-10 – 2021-02-11 (×3): 3 mL via INTRAVENOUS

## 2021-02-09 MED ORDER — CARVEDILOL 25 MG PO TABS
25.0000 mg | ORAL_TABLET | Freq: Two times a day (BID) | ORAL | Status: DC
  Administered 2021-02-09 – 2021-02-11 (×2): 25 mg via ORAL
  Filled 2021-02-09 (×3): qty 1

## 2021-02-09 MED ORDER — ONDANSETRON HCL 4 MG/2ML IJ SOLN: 4.0000 mg | Freq: Four times a day (QID) | INTRAMUSCULAR | Status: DC | PRN

## 2021-02-09 MED ORDER — HEPARIN SODIUM (PORCINE) 5000 UNIT/ML IJ SOLN
5000.0000 [IU] | Freq: Three times a day (TID) | INTRAMUSCULAR | Status: DC
  Administered 2021-02-10 – 2021-02-11 (×2): 5000 [IU] via SUBCUTANEOUS
  Filled 2021-02-09 (×3): qty 1

## 2021-02-09 MED ORDER — PANTOPRAZOLE SODIUM 40 MG PO TBEC
40.0000 mg | DELAYED_RELEASE_TABLET | Freq: Every day | ORAL | Status: DC
  Administered 2021-02-10 – 2021-02-11 (×2): 40 mg via ORAL
  Filled 2021-02-09 (×2): qty 1

## 2021-02-09 MED ORDER — LISINOPRIL 20 MG PO TABS
30.0000 mg | ORAL_TABLET | Freq: Every day | ORAL | Status: DC
  Administered 2021-02-11: 30 mg via ORAL
  Filled 2021-02-09 (×2): qty 1

## 2021-02-09 MED ORDER — MOMETASONE FURO-FORMOTEROL FUM 200-5 MCG/ACT IN AERO
2.0000 | INHALATION_SPRAY | Freq: Two times a day (BID) | RESPIRATORY_TRACT | Status: DC | PRN
  Filled 2021-02-09: qty 8.8

## 2021-02-09 MED ORDER — ACETAMINOPHEN 500 MG PO TABS: 500.0000 mg | ORAL_TABLET | Freq: Four times a day (QID) | ORAL | Status: DC | PRN

## 2021-02-09 MED ORDER — AMLODIPINE BESYLATE 10 MG PO TABS
10.0000 mg | ORAL_TABLET | Freq: Every day | ORAL | Status: DC
Start: 2021-02-09 — End: 2021-02-11
  Administered 2021-02-09 – 2021-02-11 (×2): 10 mg via ORAL
  Filled 2021-02-09 (×2): qty 1
  Filled 2021-02-09: qty 2

## 2021-02-09 MED ORDER — HEPARIN SODIUM (PORCINE) 1000 UNIT/ML DIALYSIS
20.0000 [IU]/kg | INTRAMUSCULAR | Status: DC | PRN
  Filled 2021-02-09: qty 2

## 2021-02-09 MED ORDER — ONDANSETRON HCL 4 MG PO TABS: 4.0000 mg | ORAL_TABLET | Freq: Four times a day (QID) | ORAL | Status: DC | PRN

## 2021-02-09 MED ORDER — MIRTAZAPINE 15 MG PO TABS
30.0000 mg | ORAL_TABLET | Freq: Every day | ORAL | Status: DC
  Administered 2021-02-10: 30 mg via ORAL
  Filled 2021-02-09: qty 2

## 2021-02-09 MED ORDER — TORSEMIDE 20 MG PO TABS: 20.0000 mg | ORAL_TABLET | Freq: Every day | ORAL | Status: DC

## 2021-02-09 MED ORDER — TORSEMIDE 20 MG PO TABS
100.0000 mg | ORAL_TABLET | Freq: Every day | ORAL | Status: DC
Start: 2021-02-10 — End: 2021-02-11
  Administered 2021-02-10 – 2021-02-11 (×2): 100 mg via ORAL
  Filled 2021-02-09 (×2): qty 5
  Filled 2021-02-09: qty 1

## 2021-02-09 MED ORDER — SODIUM CHLORIDE 0.9 % IV SOLN
250.0000 mL | INTRAVENOUS | Status: DC | PRN
Start: 2021-02-09 — End: 2021-02-11

## 2021-02-09 MED ORDER — ATORVASTATIN CALCIUM 80 MG PO TABS
80.0000 mg | ORAL_TABLET | Freq: Every day | ORAL | Status: DC
  Administered 2021-02-10: 80 mg via ORAL
  Filled 2021-02-09: qty 1

## 2021-02-09 MED ORDER — TOPIRAMATE 25 MG PO TABS
25.0000 mg | ORAL_TABLET | Freq: Every day | ORAL | Status: DC
  Administered 2021-02-10 – 2021-02-11 (×2): 25 mg via ORAL
  Filled 2021-02-09 (×2): qty 1

## 2021-02-09 MED ORDER — HYDROCODONE-ACETAMINOPHEN 5-325 MG PO TABS: 1.0000 | ORAL_TABLET | Freq: Four times a day (QID) | ORAL | Status: DC | PRN

## 2021-02-09 MED ORDER — ASPIRIN EC 81 MG PO TBEC
81.0000 mg | DELAYED_RELEASE_TABLET | Freq: Every day | ORAL | Status: DC
  Administered 2021-02-10 – 2021-02-11 (×2): 81 mg via ORAL
  Filled 2021-02-09 (×2): qty 1

## 2021-02-09 MED ORDER — ISOSORBIDE MONONITRATE ER 30 MG PO TB24
30.0000 mg | ORAL_TABLET | Freq: Every day | ORAL | Status: DC
  Administered 2021-02-11: 30 mg via ORAL
  Filled 2021-02-09 (×2): qty 1

## 2021-02-09 NOTE — ED Notes (Signed)
Pt to dialysis now.

## 2021-02-09 NOTE — ED Notes (Signed)
Medication Reconciliation Report  For Home History Technicians  HIGHLIGHTS:  1. The patient WAS personally interviewed 2. If not, what was the main source used: NOT APPLICABLE 3. Does the patient appear to take any anti-coagulation agents (e.g. warfarin, Eliquis or Xarelto): NO 4. Does the patient appear to take any anti-convulsant agents (e.g. divalproex, levetiracetam or phenytoin): NO 5. Does the patient appear to use any insulin products (e.g. Lantus, Novolin or Humalog): YES 6. Does the patient appear to take any "beta-blockers" (e.g. metoprolol, carvedilol or bisoprolol: YES  BARRIERS:  1. Were there any barriers that prevented or complicated the medication reconciliation process: NO 2. If yes, what was the primary barrier encountered: None 3. Does the patient appear compliant with prescribed medications: UNABLE TO DETERMINE 4. Does the patient express any barriers with compliance: NO 5. What is the primary barrier the patient reports: None   NOTES:[Include any concerns, remarks or complaints the patient expresses regarding medication therapy. Any observations or other information that might be useful to the treatment team can also be included. Immediate needs or concerns should be referred to the RN or appropriate member of the treatment team.]  Patient was interviewed and reports medications as reflected in med reconciliation tab. Of note, patient reports taking TORSEMIDE as well as FUROSEMIDE. Pharmacy records suggest the latter while recent cardiology notes suggest the former -- unable to satisfactorily confirm either medication.    Colen Darling, CPhT [place] at [facility]. [place], [place] [address] [phone]  ** The above is intended solely for informational and/or communicative purposes. It should in no way be considered an endorsement of any specific treatment, therapy or action. **

## 2021-02-09 NOTE — H&P (Signed)
History and Physical    Terry Arroyo VWU:[id number] DOB: 08-24-1961 DOA: 02/09/2021  PCP: Kirk Ruths, MD   Patient coming from: Home   I have personally briefly reviewed patient's old medical records in [place]  Chief Complaint: Chest pain                                Shortness of breath  HPI: Terry Arroyo is a 60 y.o. female with medical history significant for end-stage renal disease on hemodialysis, dialysis days are Monday/Wednesday/Friday, history of coronary artery disease, depression, COPD and diabetes mellitus who was sent to the ER via EMS for evaluation of chest pain.  Chest pain was sudden onset and mostly midsternal with radiation to the back.  She rated it a 7 x 10 in intensity at its worst.  Chest pain was associated with nausea but no vomiting.  She received 1 nitroglycerin sublingual tablet and was sent to the emergency room for evaluation.  Patient has had an MI in the past but states that this pain feels different.  EMS administered aspirin 325 mg.  Patient had a twelve-lead EKG done in the ER which showed no acute findings and troponin is flat.  She is currently chest pain-free but has shortness of breath at rest.  She denies having any diaphoresis, no palpitations, no dizziness, no lightheadedness, no fever, no chills, no leg swelling, no orthopnea, no abdominal pain, no diarrhea, no constipation, no urinary symptoms, no blurred vision or focal deficits. Labs show sodium 136, potassium 3.7, chloride 103, bicarb 24, glucose 299, BUN 41, creatinine 3.71, calcium 9.1, troponin XX, white cell count 9.4, hemoglobin 11.2, hematocrit 35.2, MCV 87.1, RDW 13.9, platelet count 378 Respiratory viral panel is negative Chest x-ray reviewed by me shows cardiomegaly with likely pulmonary edema and tiny bilateral Effusions. Twelve-lead EKG reviewed by me shows sinus rhythm with LVH   ED Course: Patient is a 60 year old African-American female with multiple medical  problems who was sent from dialysis center for evaluation of chest pain.  Patient's dialysis days are Monday/Wednesday/Friday.  She missed her outpatient dialysis session because she was in the ER for evaluation of chest pain and will need to be referred to observation status for renal replacement therapy.  Twelve-lead EKG showed no acute findings and her troponin is flat.    Review of Systems: As per HPI otherwise all other systems reviewed and negative.    Past Medical History:  Diagnosis Date  . Anemia   . Angina at rest [facility])   . Anterior cerebral aneurysm    approximately 5 mm; left MCA   . Anxiety   . Asthma   . Blind left eye   . COPD (chronic obstructive pulmonary disease) ([place])   . Coronary artery disease   . Depression   . Diabetes mellitus without complication ([place])   . ESRD (end stage renal disease) ([place])   . GERD (gastroesophageal reflux disease)   . HFrEF (heart failure with reduced ejection fraction) ([place])   . History of 2019 novel coronavirus disease (COVID-19) 03/2019   [place]  . History of DVT (deep vein thrombosis)   . Hx of CABG   . Hyperlipidemia   . Hypertension   . MI (myocardial infarction) ([place]) 2018  . Pituitary adenoma ([place])   . Sleep apnea    non-compliant with nocturnal PAP therapy  . Stroke [facility]) 2014  stated affected left side.    Past Surgical History:  Procedure Laterality Date  . AV FISTULA PLACEMENT Right 01/28/2021   Procedure: ARTERIOVENOUS (AV) FISTULA CREATION;  Surgeon: Katha Cabal, MD;  Location: [facility] ORS;  Service: Vascular;  Laterality: Right;  . BRAIN SURGERY  1986   pituitary tumor  . BREAST EXCISIONAL BIOPSY Left yrs ago   benign  . CESAREAN SECTION    . CORONARY ARTERY BYPASS GRAFT  2018   done in [place]  . DIALYSIS/PERMA CATHETER INSERTION N/A 11/07/2020   Procedure: DIALYSIS/PERMA CATHETER INSERTION;  Surgeon: Algernon Huxley, MD;  Location: [place] CV LAB;  Service:  Cardiovascular;  Laterality: N/A;  . EYE SURGERY Right 2021   cataracts      reports that she quit smoking about 4 years ago. She has never used smokeless tobacco. She reports previous alcohol use. She reports previous drug use.  Allergies  Allergen Reactions  . Hydralazine Shortness Of Breath and Swelling    Body aches  . Shellfish Allergy Anaphylaxis    Shrimp/lobster  Shrimp/lobster    . Kiwi Extract Swelling  . Metformin Other (See Comments)    GI Intolerance  . Tape Itching    Use paper tape whenever possible    Family History  Problem Relation Age of Onset  . CAD Mother   . CAD Brother   . Breast cancer Sister 36      Prior to Admission medications   Medication Sig Start Date End Date Taking? Authorizing Provider  acetaminophen (TYLENOL) 500 MG tablet Take 500 mg by mouth every 6 (six) hours as needed.    [provider]  amLODipine (NORVASC) 10 MG tablet Take 1 tablet (10 mg total) by mouth daily. 03/03/20   Lorella Nimrod, MD  ammonium lactate (LAC-HYDRIN) 12 % lotion Apply 1 application topically as needed for dry skin.    [provider]  aspirin 81 MG EC tablet Take 1 tablet (81 mg total) by mouth daily. Do not take this medication for 1 week and then restart. 09/15/20   Wyvonnia Dusky, MD  atorvastatin (LIPITOR) 80 MG tablet Take 1 tablet (80 mg total) by mouth daily at 6 PM. 11/26/19   Lavina Hamman, MD  carvedilol (COREG) 25 MG tablet Take 25 mg by mouth 2 (two) times daily with a meal.     [provider]  furosemide (LASIX) 40 MG tablet Take 40 mg by mouth.    [provider]  HYDROcodone-acetaminophen (NORCO) 5-325 MG tablet Take 1-2 tablets by mouth every 6 (six) hours as needed. 01/28/21   Schnier, [name] Lory, MD  insulin aspart protamine- aspart (NOVOLOG MIX 70/30) (70-30) 100 UNIT/ML injection Inject 0.15 mLs (15 Units total) into the skin 2 (two) times daily with a meal. Patient taking differently: Inject 15 Units into  the skin 3 (three) times daily with meals. 11/12/20   Fritzi Mandes, MD  isosorbide mononitrate (IMDUR) 30 MG 24 hr tablet Take 30 mg by mouth daily.    [provider]  lisinopril (ZESTRIL) 30 MG tablet Take 30 mg by mouth daily. 01/09/21   [provider]  mirtazapine (REMERON) 30 MG tablet Take 1 tablet (30 mg total) by mouth at bedtime. 11/26/19   Lavina Hamman, MD  mometasone-formoterol [facility]) 200-5 MCG/ACT AERO Inhale 2 puffs into the lungs 2 (two) times daily as needed (respiratory issues.).    [provider]  omeprazole (PRILOSEC) 20 MG capsule Take 20 mg by  mouth daily.  09/22/20   [provider]  topiramate (TOPAMAX) 25 MG tablet Take 25 mg by mouth daily. 08/19/20   [provider]  torsemide (DEMADEX) 10 MG tablet Take 2 tablets (20 mg total) by mouth daily. 10/12/20   Lorella Nimrod, MD    Physical Exam: Vitals:   02/09/21 1500 02/09/21 1515 02/09/21 1516 02/09/21 1615  BP: (!) 188/81 (!) 186/117  (!) 176/93  Pulse:  66  64  Resp:  (!) 30  19  Temp:      SpO2: 96% 95% 96% 99%  Weight:      Height:         Vitals:   02/09/21 1500 02/09/21 1515 02/09/21 1516 02/09/21 1615  BP: (!) 188/81 (!) 186/117  (!) 176/93  Pulse:  66  64  Resp:  (!) 30  19  Temp:      SpO2: 96% 95% 96% 99%  Weight:      Height:          Constitutional: Alert and oriented x 3 .  Patient in moderate respiratory distress HEENT:      Head: Normocephalic and atraumatic.         Eyes: PERLA, EOMI, Conjunctivae are normal. Sclera is non-icteric.       Mouth/Throat: Mucous membranes are moist.       Neck: Supple with no signs of meningismus. Cardiovascular: Regular rate and rhythm. No murmurs, gallops, or rubs. 2+ symmetrical distal pulses are present . No JVD. No LE edema Respiratory:  Tachypneic.crackles at the bases bilaterally. No wheezes,  or rhonchi.  Gastrointestinal: Soft, non tender, and non distended with positive bowel sounds.  Genitourinary:  No CVA tenderness. Musculoskeletal: Nontender with normal range of motion in all extremities. No cyanosis, or erythema of extremities. Neurologic:  Face is symmetric. Moving all extremities. No gross focal neurologic deficits  Skin: Skin is warm, dry.  No rash or ulcers Psychiatric: Mood and affect are normal   Labs on Admission: I have personally reviewed following labs and imaging studies  CBC: Recent Labs  Lab 02/09/21 1229  WBC 9.4  HGB 11.2*  HCT 35.2*  MCV 87.1  PLT 976   Basic Metabolic Panel: Recent Labs  Lab 02/09/21 1229  NA 136  K 3.7  CL 102  CO2 24  GLUCOSE 299*  BUN 41*  CREATININE 3.71*  CALCIUM 9.1   GFR: Estimated Creatinine Clearance: 17.8 mL/min (A) (by C-G formula based on SCr of 3.71 mg/dL (H)). Liver Function Tests: No results for input(s): AST, ALT, ALKPHOS, BILITOT, PROT, ALBUMIN in the last 168 hours. No results for input(s): LIPASE, AMYLASE in the last 168 hours. No results for input(s): AMMONIA in the last 168 hours. Coagulation Profile: No results for input(s): INR, PROTIME in the last 168 hours. Cardiac Enzymes: No results for input(s): CKTOTAL, CKMB, CKMBINDEX, TROPONINI in the last 168 hours. BNP (last 3 results) No results for input(s): PROBNP in the last 8760 hours. HbA1C: No results for input(s): HGBA1C in the last 72 hours. CBG: No results for input(s): GLUCAP in the last 168 hours. Lipid Profile: No results for input(s): CHOL, HDL, LDLCALC, TRIG, CHOLHDL, LDLDIRECT in the last 72 hours. Thyroid Function Tests: No results for input(s): TSH, T4TOTAL, FREET4, T3FREE, THYROIDAB in the last 72 hours. Anemia Panel: No results for input(s): VITAMINB12, FOLATE, FERRITIN, TIBC, IRON, RETICCTPCT in the last 72 hours. Urine analysis:    Component Value Date/Time   COLORURINE STRAW (A) 10/21/2020 7341  APPEARANCEUR CLEAR (A) 10/21/2020 0829   LABSPEC 1.007 10/21/2020 0829   PHURINE 7.0 10/21/2020 0829   GLUCOSEU 150 (A) 10/21/2020  0829   HGBUR NEGATIVE 10/21/2020 0829   BILIRUBINUR NEGATIVE 10/21/2020 0829   KETONESUR NEGATIVE 10/21/2020 0829   PROTEINUR >=300 (A) 10/21/2020 0829   NITRITE NEGATIVE 10/21/2020 0829   LEUKOCYTESUR SMALL (A) 10/21/2020 0829    Radiological Exams on Admission: DG Chest 2 View  Result Date: 02/09/2021 CLINICAL DATA:  Patient arrives from dialysis with midsternal chest pain, did not receive dialysis treatment. Elevated blood pressure and shortness of breath x2 days EXAM: CHEST - 2 VIEW COMPARISON:  Chest radiograph November 09, 2020 FINDINGS: Right IJ central venous catheter with tip overlying the right atrium. Stable mild cardiomegaly. Surgical clips overlie the mediastinum. Left greater than right basilar and perihilar opacities. Tiny bilateral pleural effusions. The visualized skeletal structures are unchanged. IMPRESSION: Cardiomegaly with likely pulmonary edema and tiny bilateral effusions. Electronically Signed   By: Dahlia Bailiff MD   On: 02/09/2021 13:06     Assessment/Plan Principal Problem:   Chest pain Active Problems:   DM2 (diabetes mellitus, type 2) (HCC)   Acute on chronic combined systolic and diastolic CHF (congestive heart failure) (HCC)   CAD (coronary artery disease)   ESRD (end stage renal disease) (HCC)   Depression   COPD (chronic obstructive pulmonary disease) with emphysema (HCC)      Chest pain In a patient with known coronary artery disease Chest pain appears to be atypical and has resolved.  Patient has no EKG changes and troponin is flat Continue aspirin, nitrates, statins and beta-blockers    Acute on chronic combined systolic and diastolic dysfunction CHF Patient presents for evaluation of chest pain which has resolved but now is in respiratory distress at rest Chest x-ray shows pulmonary edema with bilateral pleural effusions Blood pressure significantly elevated Patient's last known LVEF is 40 to 45% (11/21) Expect improvement in patient's  symptoms for renal replacement therapy Optimize blood pressure control   Diabetes mellitus with complications of end-stage renal disease on hemodialysis Patient's dialysis days are Monday/Wednesday/Friday We will request nephrology consult for renal replacement therapy Maintain consistent carbohydrate diet Glycemic control with sliding scale insulin   Hypertension Continue amlodipine, carvedilol, Imdur and lisinopril   Depression Continue mirtazapine   COPD Not acutely exacerbated Continue as needed bronchodilator therapy as well as inhaled steroids   GERD Continue Protonix  DVT prophylaxis: Heparin Code Status: full code Family Communication: Greater than 50% of time was spent discussing patient's condition and plan of care with her at the bedside.  All questions and concerns have been addressed.  She verbalizes understanding and agrees with the plan. Disposition Plan: Back to previous home environment Consults called: Nephrology Status: Observation    Rylie Knierim MD [facility]     02/09/2021, 5:22 PM

## 2021-02-09 NOTE — ED Notes (Signed)
Pt BP 186/117. Cheri Fowler, MD notified. This RN also placed pt on 2L for comfort.

## 2021-02-09 NOTE — ED Provider Notes (Signed)
[facility] Emergency Department Provider Note   ____________________________________________   Event Date/Time   First MD Initiated Contact with Patient 02/09/21 1248     (approximate)  I have reviewed the triage vital signs and the nursing notes.   HISTORY  Chief Complaint Chest Pain    HPI SHANETTE TAMARGO is a 60 y.o. female a stated past medical history of CVA in the past type 2 diabetes, CAD, COPD, and end-stage renal disease on dialysis Monday/Wednesday/Friday who presents for chest pain from her dialysis office.  Patient did not receive her treatment of dialysis today as she has been having some chest tightness and shortness of breath that began over the weekend.  Patient denies any increase in fluid intake but does state that she had a bowl of "oodles of noodles" that was very salty and is concerned that this may have caused her to retain more fluid.  Patient endorses dyspnea on exertion.  Patient describes the chest pain as a bandlike tightness around the inferior anterior portion of her chest.  Of note patient states that she had a fistula placed to the right arm approximately 2 weeks prior to arrival.  Patient currently denies any vision changes, tinnitus, difficulty speaking, facial droop, sore throat, abdominal pain, nausea/vomiting/diarrhea, dysuria, or weakness/numbness/paresthesias in any extremity         Past Medical History:  Diagnosis Date  . Anemia   . Angina at rest [facility])   . Anterior cerebral aneurysm    approximately 5 mm; left MCA   . Anxiety   . Asthma   . Blind left eye   . COPD (chronic obstructive pulmonary disease) ([place])   . Coronary artery disease   . Depression   . Diabetes mellitus without complication ([place])   . ESRD (end stage renal disease) ([place])   . GERD (gastroesophageal reflux disease)   . HFrEF (heart failure with reduced ejection fraction) ([place])   . History of 2019 novel coronavirus disease (COVID-19) 03/2019    [place]  . History of DVT (deep vein thrombosis)   . Hx of CABG   . Hyperlipidemia   . Hypertension   . MI (myocardial infarction) ([place]) 2018  . Pituitary adenoma ([place])   . Sleep apnea    non-compliant with nocturnal PAP therapy  . Stroke [facility]) 2014   stated affected left side.    Patient Active Problem List   Diagnosis Date Noted  . COPD (chronic obstructive pulmonary disease) with emphysema ([place]) 02/09/2021  . Chronic sphenoidal sinusitis 01/01/2021  . Contact dermatitis and other eczema due to other specified agent 01/01/2021  . Hydronephrosis, left 01/01/2021  . Other diseases of nasal cavity and sinuses(478.19) 01/01/2021  . ESRD (end stage renal disease) ([place])   . Reactive thrombocytosis 11/08/2020  . Nephrotic syndrome 11/04/2020  . SOB (shortness of breath) 11/01/2020  . Acute on chronic combined systolic and diastolic CHF (congestive heart failure) ([place]) 10/21/2020  . CAD (coronary artery disease) 10/21/2020  . Hyperlipidemia associated with type 2 diabetes mellitus ([place]) 10/21/2020  . HFrEF (heart failure with reduced ejection fraction) ([place])   . Acute respiratory distress   . COPD exacerbation ([place]) 10/10/2020  . Kidney hematoma 09/13/2020  . Hypertension associated with diabetes ([place])   . Acute renal failure superimposed on stage 4 chronic kidney disease ([place])   . Hematuria 09/12/2020  . 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[place]) 09/03/2020  . Blind left eye 09/03/2020  .  Brain aneurysm 09/03/2020  . Esotropia 09/03/2020  . Lesion of ulnar nerve 09/03/2020  . Mixed hyperlipidemia 09/03/2020  . Sensory hearing loss, bilateral 09/03/2020  . Tear film insufficiency 09/03/2020  . Health maintenance examination 06/27/2020  . Secondary hyperparathyroidism of renal origin ([place]) 04/24/2020  . Anemia in chronic kidney disease 03/10/2020  . Benign hypertensive kidney disease with chronic kidney disease 03/10/2020   . Hyposmolality and/or hyponatremia 03/10/2020  . Stage 3b chronic kidney disease ([place]) 03/10/2020  . Calf tenderness   . Acute on chronic heart failure ([place]) 02/27/2020  . Leg edema   . DM2 (diabetes mellitus, type 2) ([place]) 02/26/2020  . CKD (chronic kidney disease) stage 4, GFR 15-29 ml/min (HCC) 02/26/2020  . Hyponatremia 02/26/2020  . Anasarca 02/26/2020  . Proteinuria 02/26/2020  . Hypoalbuminemia 02/26/2020  . Elevated troponin 02/26/2020  . Acute on chronic systolic CHF (congestive heart failure) ([place]) 02/26/2020  . Aortic atherosclerosis ([place]) 02/18/2020  . Hyperglycemia   . Nausea   . Hypertensive emergency   . Hypertensive urgency 11/24/2019  . Chest pain 11/23/2019  . OSA (obstructive sleep apnea) 04/10/2019  . Mild episode of recurrent major depressive disorder ([place]) 04/10/2019  . ACS (acute coronary syndrome) ([place]) 03/24/2019  . Pneumonia due to COVID-19 virus 03/24/2019  . Hypertensive heart/kidney disease w/chronic kidney disease stage III ([place]) 10/21/2017  . Mild nonproliferative diabetic retinopathy of right eye associated with type 2 diabetes mellitus ([place]) 09/09/2017  . Genital herpes 05/07/2017  . DVT (deep venous thrombosis) ([place]) 12/30/2016  . PVD (posterior vitreous detachment), right eye 01/09/2016  . Diabetic peripheral angiopathy ([place]) 01/03/2016  . Age-related nuclear cataract of both eyes 10/24/2015  . Chronic nonintractable headache 10/02/2015  . Dry eyes, bilateral 02/13/2015  . Proptosis 12/18/2014  . Iritis of right eye 12/03/2014  . Anemia 08/30/2014  . Depression 08/30/2014    Past Surgical History:  Procedure Laterality Date  . AV FISTULA PLACEMENT Right 01/28/2021   Procedure: ARTERIOVENOUS (AV) FISTULA CREATION;  Surgeon: Katha Cabal, MD;  Location: [facility] ORS;  Service: Vascular;  Laterality: Right;  . BRAIN SURGERY  1986   pituitary tumor  . BREAST EXCISIONAL BIOPSY Left yrs ago   benign  . CESAREAN SECTION    . CORONARY  ARTERY BYPASS GRAFT  2018   done in [place]  . DIALYSIS/PERMA CATHETER INSERTION N/A 11/07/2020   Procedure: DIALYSIS/PERMA CATHETER INSERTION;  Surgeon: Algernon Huxley, MD;  Location: [facility];  Service: Cardiovascular;  Laterality: N/A;  . EYE SURGERY Right 2021   cataracts     Prior to Admission medications   Medication Sig Start Date End Date Taking? Authorizing Provider  acetaminophen (TYLENOL) 500 MG tablet Take 500 mg by mouth every 6 (six) hours as needed.    [provider]  amLODipine (NORVASC) 10 MG tablet Take 1 tablet (10 mg total) by mouth daily. 03/03/20   Lorella Nimrod, MD  ammonium lactate (LAC-HYDRIN) 12 % lotion Apply 1 application topically as needed for dry skin.    [provider]  aspirin 81 MG EC tablet Take 1 tablet (81 mg total) by mouth daily. Do not take this medication for 1 week and then restart. 09/15/20   Wyvonnia Dusky, MD  atorvastatin (LIPITOR) 80 MG tablet Take 1 tablet (80 mg total) by mouth daily at 6 PM. 11/26/19   Lavina Hamman, MD  carvedilol (COREG) 25 MG tablet Take 25 mg by mouth 2 (two) times daily with a meal.  [provider]  furosemide (LASIX) 40 MG tablet Take 40 mg by mouth.    [provider]  HYDROcodone-acetaminophen (NORCO) 5-325 MG tablet Take 1-2 tablets by mouth every 6 (six) hours as needed. 01/28/21   Schnier, [name] Lory, MD  insulin aspart protamine- aspart (NOVOLOG MIX 70/30) (70-30) 100 UNIT/ML injection Inject 0.15 mLs (15 Units total) into the skin 2 (two) times daily with a meal. Patient taking differently: Inject 15 Units into the skin 3 (three) times daily with meals. 11/12/20   Fritzi Mandes, MD  isosorbide mononitrate (IMDUR) 30 MG 24 hr tablet Take 30 mg by mouth daily.    [provider]  lisinopril (ZESTRIL) 30 MG tablet Take 30 mg by mouth daily. 01/09/21   [provider]  mirtazapine (REMERON) 30 MG tablet Take 1 tablet (30 mg total) by mouth at bedtime.  11/26/19   Lavina Hamman, MD  mometasone-formoterol [facility]) 200-5 MCG/ACT AERO Inhale 2 puffs into the lungs 2 (two) times daily as needed (respiratory issues.).    [provider]  omeprazole (PRILOSEC) 20 MG capsule Take 20 mg by mouth daily.  09/22/20   [provider]  topiramate (TOPAMAX) 25 MG tablet Take 25 mg by mouth daily. 08/19/20   [provider]  torsemide (DEMADEX) 10 MG tablet Take 2 tablets (20 mg total) by mouth daily. 10/12/20   Lorella Nimrod, MD    Allergies Hydralazine, Shellfish allergy, Kiwi extract, Metformin, and Tape  Family History  Problem Relation Age of Onset  . CAD Mother   . CAD Brother   . Breast cancer Sister 6    Social History Social History   Tobacco Use  . Smoking status: Former Smoker    Quit date: 2018    Years since quitting: 4.1  . Smokeless tobacco: Never Used  Vaping Use  . Vaping Use: Never used  Substance Use Topics  . Alcohol use: Not Currently  . Drug use: Not Currently    Review of Systems Constitutional: No fever/chills Eyes: No visual changes. ENT: No sore throat. Cardiovascular: Endorses chest pain. Respiratory: Endorses shortness of breath. Gastrointestinal: No abdominal pain.  No nausea, no vomiting.  No diarrhea. Genitourinary: Negative for dysuria. Musculoskeletal: Negative for acute arthralgias Skin: Negative for rash. Neurological: Negative for headaches, weakness/numbness/paresthesias in any extremity Psychiatric: Negative for suicidal ideation/homicidal ideation   ____________________________________________   PHYSICAL EXAM:  VITAL SIGNS: ED Triage Vitals  Enc Vitals Group     BP 02/09/21 1229 (!) 186/90     Pulse Rate 02/09/21 1229 70     Resp 02/09/21 1229 (!) 22     Temp 02/09/21 1229 98 F (36.7 C)     Temp src --      SpO2 02/09/21 1229 91 %     Weight 02/09/21 1227 179 lb (81.2 kg)     Height 02/09/21 1227 5\' 6"  (1.676 m)     Head Circumference --      Peak  Flow --      Pain Score 02/09/21 1227 6     Pain Loc --      Pain Edu? --      Excl. in [place]? --    Constitutional: Alert and oriented. Well appearing and in no acute distress. Eyes: Conjunctivae are normal. PERRL. Head: Atraumatic. Nose: No congestion/rhinnorhea. Mouth/Throat: Mucous membranes are moist. Neck: No stridor Cardiovascular: Grossly normal heart sounds.  Good peripheral circulation. Respiratory: Normal respiratory effort.  No retractions. Gastrointestinal: Soft and nontender. No  distention. Musculoskeletal: No obvious deformities Neurologic:  Normal speech and language. No gross focal neurologic deficits are appreciated. Skin:  Skin is warm and dry. No rash noted.  Dual lumen dialysis catheter to right upper anterior chest wall.  Right AV fistula in the [facility] fossa well-healing and with minimal surrounding edema Psychiatric: Mood and affect are normal. Speech and behavior are normal.  ____________________________________________   LABS (all labs ordered are listed, but only abnormal results are displayed)  Labs Reviewed  BASIC METABOLIC PANEL - Abnormal; Notable for the following components:      Result Value   Glucose, Bld 299 (*)    BUN 41 (*)    Creatinine, Ser 3.71 (*)    GFR, Estimated 14 (*)    All other components within normal limits  CBC - Abnormal; Notable for the following components:   Hemoglobin 11.2 (*)    HCT 35.2 (*)    All other components within normal limits  TROPONIN I (HIGH SENSITIVITY) - Abnormal; Notable for the following components:   Troponin I (High Sensitivity) 20 (*)    All other components within normal limits  TROPONIN I (HIGH SENSITIVITY) - Abnormal; Notable for the following components:   Troponin I (High Sensitivity) 19 (*)    All other components within normal limits  RESP PANEL BY RT-PCR (FLU A&B, COVID) ARPGX2  HIV ANTIBODY (ROUTINE TESTING W REFLEX)  HEMOGLOBIN W0J  BASIC METABOLIC PANEL  CBC    ____________________________________________  EKG  ED ECG REPORT I, Naaman Plummer, the attending physician, personally viewed and interpreted this ECG.  Date: 02/09/2021 EKG Time: 1223 Rate: 71 Rhythm: normal sinus rhythm QRS Axis: normal Intervals: normal ST/T Wave abnormalities: normal Narrative Interpretation: no evidence of acute ischemia  ____________________________________________  RADIOLOGY  ED MD interpretation: 2 view chest x-ray shows cardiomegaly with likely pulmonary edema and tiny bilateral effusions  Official radiology report(s): DG Chest 2 View  Result Date: 02/09/2021 CLINICAL DATA:  Patient arrives from dialysis with midsternal chest pain, did not receive dialysis treatment. Elevated blood pressure and shortness of breath x2 days EXAM: CHEST - 2 VIEW COMPARISON:  Chest radiograph November 09, 2020 FINDINGS: Right IJ central venous catheter with tip overlying the right atrium. Stable mild cardiomegaly. Surgical clips overlie the mediastinum. Left greater than right basilar and perihilar opacities. Tiny bilateral pleural effusions. The visualized skeletal structures are unchanged. IMPRESSION: Cardiomegaly with likely pulmonary edema and tiny bilateral effusions. Electronically Signed   By: Dahlia Bailiff MD   On: 02/09/2021 13:06    ____________________________________________   PROCEDURES  Procedure(s) performed (including Critical Care):  .Critical Care Performed by: Naaman Plummer, MD Authorized by: Naaman Plummer, MD   Critical care provider statement:    Critical care time (minutes):  37   Critical care was necessary to treat or prevent imminent or life-threatening deterioration of the following conditions:  Respiratory failure   Critical care was time spent personally by me on the following activities:  Discussions with consultants, evaluation of patient's response to treatment, examination of patient, ordering and performing treatments and  interventions, ordering and review of laboratory studies, ordering and review of radiographic studies, pulse oximetry, re-evaluation of patient's condition, obtaining history from patient or surrogate and review of old charts   I assumed direction of critical care for this patient from another provider in my specialty: no     Care discussed with: admitting provider   .1-3 Lead EKG Interpretation Performed by: Naaman Plummer, MD Authorized by:  Naaman Plummer, MD     Interpretation: normal     ECG rate:  63   ECG rate assessment: normal     Rhythm: sinus rhythm     Ectopy: none     Conduction: normal       ____________________________________________   INITIAL IMPRESSION / ASSESSMENT AND PLAN / ED COURSE  As part of my medical decision making, I reviewed the following data within the [place] notes reviewed and incorporated, Labs reviewed, EKG interpreted, Old chart reviewed, Radiograph reviewed and Notes from prior ED visits reviewed and incorporated        Patient is a 60 year old female who presents for chest pain from her dialysis office with associated shortness of breath.  Differential diagnosis for this patient includes but is not limited to, pulmonary edema, heart failure exacerbation, ACS, aortic dissection, or pneumonia Laboratory/radiologic evaluation of his bilateral pulmonary edema and pleural effusions that are likely contributing to patient's significant chest pain and shortness of breath.  Troponin is at baseline for patient Consult: Dr. Candiss Norse in nephrology who agrees to dialyze the patient as quickly as possible Dr. Francine Graven internal medicine agrees to accept this patient onto her service for further management Dispo: Admit to observation for dialysis      ____________________________________________   FINAL CLINICAL IMPRESSION(S) / ED DIAGNOSES  Final diagnoses:  Chest pain, unspecified type  Acute pulmonary edema (HCC)  Pleural  effusion, bilateral     ED Discharge Orders    None       Note:  This document was prepared using Dragon voice recognition software and may include unintentional dictation errors.   Naaman Plummer, MD 02/09/21 1726

## 2021-02-09 NOTE — ED Triage Notes (Signed)
First Nurse NOte:  [facility] with C/O chest pain.  States went to dialysis and sent to ED.  Did not have a treatment.  NTG x 1 given.  ASA 325 mg given by EMS.  BP  200/88. Has hisotry of MI in 2018.

## 2021-02-09 NOTE — ED Notes (Addendum)
Pt stuck by [employer] nurse and unable to gain IV access at this time. Pt stuck butterfly and unable to collect blood. EDT Mel to attempt.

## 2021-02-09 NOTE — ED Triage Notes (Addendum)
Pt comes via EMS from diaysis with c/o CP. Pt states she did not have her treatment. Pt states M,W,F treatment days. Pt states mid sternal CP. EMs gave 326 aspirin and 1 nitro. EMS reports elevated BP.  Pt states SOB. Pt states pain that radiates to her back.  Pt has labored breathing and some accessory muscle use. Pt states 6/10 pain

## 2021-02-09 NOTE — Progress Notes (Signed)
[place], [place] 02/09/21  Subjective:   Hospital day # 0 Patient presents to the emergency room for shortness of breath.  She was found to have severely elevated blood pressure and lower extremity edema.  Chest x-ray shows pulmonary edema.  Urgent nephrology consult is requested for hemodialysis.  Patient states that she did not miss her dialysis treatment she did go to her treatment on Friday.     Objective:  Vital signs in last 24 hours:  Temp:  [98 F (36.7 C)] 98 F (36.7 C) (02/21 1229) Pulse Rate:  [66-75] 66 (02/21 1515) Resp:  [17-30] 30 (02/21 1515) BP: (180-211)/(80-117) 186/117 (02/21 1515) SpO2:  [91 %-98 %] 96 % (02/21 1516) Weight:  [81.2 kg] 81.2 kg (02/21 1227)  Weight change:  Filed Weights   02/09/21 1227  Weight: 81.2 kg    Intake/Output:   No intake or output data in the 24 hours ending 02/09/21 1541  Physical Exam: General:  Moderate distress distress, laying in the bed  HEENT  anicteric, moist oral mucous membrane  Pulm/lungs  bilateral crackles  CVS/Heart  regular rhythm, no rub or gallop  Abdomen:   Soft, nontender  Extremities:  2+ peripheral edema  Neurologic:  Alert, oriented, able to follow commands  Skin:  No acute rashes  Right IJ PermCath    Basic Metabolic Panel:  Recent Labs  Lab 02/09/21 1229  NA 136  K 3.7  CL 102  CO2 24  GLUCOSE 299*  BUN 41*  CREATININE 3.71*  CALCIUM 9.1     CBC: Recent Labs  Lab 02/09/21 1229  WBC 9.4  HGB 11.2*  HCT 35.2*  MCV 87.1  PLT 378      Lab Results  Component Value Date   HEPBSAG NON REACTIVE 11/09/2020   HEPBSAB Reactive (A) 11/09/2020   HEPBIGM NON REACTIVE 03/02/2020      Microbiology:  No results found for this or any previous visit (from the past 240 hour(s)).  Coagulation Studies: No results for input(s): LABPROT, INR in the last 72 hours.  Urinalysis: No results for input(s): COLORURINE, LABSPEC, PHURINE, GLUCOSEU, HGBUR,  BILIRUBINUR, KETONESUR, PROTEINUR, UROBILINOGEN, NITRITE, LEUKOCYTESUR in the last 72 hours.  Invalid input(s): APPERANCEUR    Imaging: DG Chest 2 View  Result Date: 02/09/2021 CLINICAL DATA:  Patient arrives from dialysis with midsternal chest pain, did not receive dialysis treatment. Elevated blood pressure and shortness of breath x2 days EXAM: CHEST - 2 VIEW COMPARISON:  Chest radiograph November 09, 2020 FINDINGS: Right IJ central venous catheter with tip overlying the right atrium. Stable mild cardiomegaly. Surgical clips overlie the mediastinum. Left greater than right basilar and perihilar opacities. Tiny bilateral pleural effusions. The visualized skeletal structures are unchanged. IMPRESSION: Cardiomegaly with likely pulmonary edema and tiny bilateral effusions. Electronically Signed   By: Dahlia Bailiff MD   On: 02/09/2021 13:06     Medications:       Assessment/ Plan:  60 y.o. female with COPD, coronary disease, depression, diabetes, end-stage renal disease, GERD, heart failure with reduced ejection fraction, Covid 19 in April 2020, history of DVT, history of CABG, ventilatory adenoma, sleep apnea, history of stroke    admitted on 02/09/2021 for Chest pains, EMS  [facility] dialysis/MWF/78 kg/240 minutes / 78.5 kg  #Acute pulmonary edema in the setting of end-stage renal disease #End-stage renal disease #Anemia of chronic kidney disease #Hypertension, uncontrolled #Lower extremity edema #Secondary hyperparathyroidism  Lab Results  Component Value Date  CALCIUM 9.1 02/09/2021   CAION 1.13 (L) 01/28/2021   PHOS 3.4 11/12/2020     Plan: Urgent hemodialysis treatment.  Orders have been prepared and dialysis nurse has been notified.  Ultrafiltration goal of 2.5 to 3 kg as tolerated.  Counseled patient to follow strict fluid restriction.  Will increase the dose of torsemide to 100 mg daily.  Blood pressure control might improve with volume removal.  Consider dosing  antihypertensives in the evening. -Monitor calcium and phosphorus during this admission -Holding Epogen administration due to uncontrolled blood pressure      LOS: 0 Milarose Savich 2/21/20223:41 PM  [place], [place]  Note: This note was prepared with Dragon dictation. Any transcription errors are unintentional

## 2021-02-10 DIAGNOSIS — D631 Anemia in chronic kidney disease: Secondary | ICD-10-CM | POA: Diagnosis not present

## 2021-02-10 DIAGNOSIS — I10 Essential (primary) hypertension: Secondary | ICD-10-CM | POA: Diagnosis not present

## 2021-02-10 DIAGNOSIS — R079 Chest pain, unspecified: Secondary | ICD-10-CM | POA: Diagnosis not present

## 2021-02-10 DIAGNOSIS — I5043 Acute on chronic combined systolic (congestive) and diastolic (congestive) heart failure: Secondary | ICD-10-CM | POA: Diagnosis not present

## 2021-02-10 DIAGNOSIS — N186 End stage renal disease: Secondary | ICD-10-CM | POA: Diagnosis not present

## 2021-02-10 DIAGNOSIS — J810 Acute pulmonary edema: Secondary | ICD-10-CM | POA: Diagnosis not present

## 2021-02-10 DIAGNOSIS — I25118 Atherosclerotic heart disease of native coronary artery with other forms of angina pectoris: Secondary | ICD-10-CM | POA: Diagnosis not present

## 2021-02-10 DIAGNOSIS — R600 Localized edema: Secondary | ICD-10-CM | POA: Diagnosis not present

## 2021-02-10 LAB — CBC
HCT: 33.5 % — ABNORMAL LOW (ref 36.0–46.0)
Hemoglobin: 10.6 g/dL — ABNORMAL LOW (ref 12.0–15.0)
MCH: 27.3 pg (ref 26.0–34.0)
MCHC: 31.6 g/dL (ref 30.0–36.0)
MCV: 86.3 fL (ref 80.0–100.0)
Platelets: 289 10*3/uL (ref 150–400)
RBC: 3.88 MIL/uL (ref 3.87–5.11)
RDW: 13.9 % (ref 11.5–15.5)
WBC: 6.7 10*3/uL (ref 4.0–10.5)
nRBC: 0 % (ref 0.0–0.2)

## 2021-02-10 LAB — BASIC METABOLIC PANEL
Anion gap: 9 (ref 5–15)
BUN: 24 mg/dL — ABNORMAL HIGH (ref 6–20)
CO2: 27 mmol/L (ref 22–32)
Calcium: 8.1 mg/dL — ABNORMAL LOW (ref 8.9–10.3)
Chloride: 100 mmol/L (ref 98–111)
Creatinine, Ser: 2.84 mg/dL — ABNORMAL HIGH (ref 0.44–1.00)
GFR, Estimated: 19 mL/min — ABNORMAL LOW (ref >60)
Glucose, Bld: 300 mg/dL — ABNORMAL HIGH (ref 70–99)
Potassium: 3.3 mmol/L — ABNORMAL LOW (ref 3.5–5.1)
Sodium: 136 mmol/L (ref 135–145)

## 2021-02-10 LAB — GLUCOSE, CAPILLARY
Glucose-Capillary: 172 mg/dL — ABNORMAL HIGH (ref 70–99)
Glucose-Capillary: 206 mg/dL — ABNORMAL HIGH (ref 70–99)
Glucose-Capillary: 120 mg/dL — ABNORMAL HIGH (ref 70–99)
Glucose-Capillary: 131 mg/dL — ABNORMAL HIGH (ref 70–99)

## 2021-02-10 LAB — HEPATITIS B SURFACE ANTIGEN
Hepatitis B Surface Ag: NONREACTIVE
Hepatitis B Surface Ag: NONREACTIVE

## 2021-02-10 LAB — CBG MONITORING, ED: Glucose-Capillary: 244 mg/dL — ABNORMAL HIGH (ref 70–99)

## 2021-02-10 MED ORDER — HEPARIN SODIUM (PORCINE) 1000 UNIT/ML IJ SOLN: 3600.0000 [IU] | Freq: Once | INTRAMUSCULAR | Status: DC

## 2021-02-10 MED ORDER — POTASSIUM CHLORIDE CRYS ER 10 MEQ PO TBCR
10.0000 meq | EXTENDED_RELEASE_TABLET | Freq: Every day | ORAL | Status: DC
  Administered 2021-02-10 – 2021-02-11 (×2): 10 meq via ORAL
  Filled 2021-02-10 (×2): qty 1

## 2021-02-10 MED ORDER — ASPIRIN 81 MG PO TBEC
81.0000 mg | DELAYED_RELEASE_TABLET | Freq: Every day | ORAL | 0 refills | Status: AC
Start: 2021-02-10 — End: ?

## 2021-02-10 NOTE — ED Notes (Signed)
Pt given morning meds at this time.  Pt also helped with repositioning in bed.  Pt comfortable at this time and denies any other needs.

## 2021-02-10 NOTE — Progress Notes (Signed)
[place], [place] 02/10/21  Subjective:   Hospital day # 0 2/21- Patient presents to the emergency room for shortness of breath.  She was found to have severely elevated blood pressure and lower extremity edema.  Chest x-ray shows pulmonary edema.  Urgent nephrology consult is requested for hemodialysis.  Patient states that she did not miss her dialysis treatment she did go to her treatment on Friday.  HD on 2/21 removed 1700 cc. Patient was on 2 L. She requested to end treatment early because of cramping.  2/22-oral intake remains very poor.  Continues to have significant lower extremity edema.  Discontinued her treatment early yesterday because of cramping.  We discussed with patient about an extra treatment today for volume removal   Objective:  Vital signs in last 24 hours:  Temp:  [98 F (36.7 C)-98.5 F (36.9 C)] 98.5 F (36.9 C) (02/22 0754) Pulse Rate:  [60-75] 61 (02/22 0754) Resp:  [16-30] 18 (02/22 0754) BP: (112-211)/(70-117) 166/91 (02/22 0754) SpO2:  [91 %-100 %] 100 % (02/22 0754) Weight:  [81.2 kg] 81.2 kg (02/21 1227)  Weight change:  Filed Weights   02/09/21 1227  Weight: 81.2 kg    Intake/Output:    Intake/Output Summary (Last 24 hours) at 02/10/2021 7510 Last data filed at 02/09/2021 2300 Gross per 24 hour  Intake -  Output 1796 ml  Net -1796 ml    Physical Exam: General:  Moderate distress distress, laying in the bed  HEENT  anicteric, moist oral mucous membrane  Pulm/lungs  mild bilateral crackles, improved, [place] O2  CVS/Heart  regular rhythm, no rub or gallop  Abdomen:   Soft, nontender  Extremities:  2+ peripheral edema  Neurologic:  Alert, oriented, able to follow commands  Skin:  No acute rashes  Right IJ PermCath    Basic Metabolic Panel:  Recent Labs  Lab 02/09/21 1229 02/10/21 0356  NA 136 136  K 3.7 3.3*  CL 102 100  CO2 24 27  GLUCOSE 299* 300*  BUN 41* 24*  CREATININE 3.71* 2.84*  CALCIUM 9.1  8.1*     CBC: Recent Labs  Lab 02/09/21 1229 02/10/21 0356  WBC 9.4 6.7  HGB 11.2* 10.6*  HCT 35.2* 33.5*  MCV 87.1 86.3  PLT 378 289      Lab Results  Component Value Date   HEPBSAG NON REACTIVE 11/09/2020   HEPBSAB Reactive (A) 11/09/2020   HEPBIGM NON REACTIVE 03/02/2020      Microbiology:  Recent Results (from the past 240 hour(s))  Resp Panel by RT-PCR (Flu A&B, Covid) Nasopharyngeal Swab     Status: None   Collection Time: 02/09/21  4:06 PM   Specimen: Nasopharyngeal Swab; Nasopharyngeal(NP) swabs in vial transport medium  Result Value Ref Range Status   SARS Coronavirus 2 by RT PCR NEGATIVE NEGATIVE Final    Comment: (NOTE) SARS-CoV-2 target nucleic acids are NOT DETECTED.  The SARS-CoV-2 RNA is generally detectable in upper respiratory specimens during the acute phase of infection. The lowest concentration of SARS-CoV-2 viral copies this assay can detect is 138 copies/mL. A negative result does not preclude SARS-Cov-2 infection and should not be used as the sole basis for treatment or other patient management decisions. A negative result may occur with  improper specimen collection/handling, submission of specimen other than nasopharyngeal swab, presence of viral mutation(s) within the areas targeted by this assay, and inadequate number of viral copies(<138 copies/mL). A negative result must be combined with clinical observations, patient  history, and epidemiological information. The expected result is Negative.  Fact Sheet for Patients:  EntrepreneurPulse.com.au  Fact Sheet for Healthcare Providers:  IncredibleEmployment.be  This test is no t yet approved or cleared by the [place] FDA and  has been authorized for detection and/or diagnosis of SARS-CoV-2 by FDA under an Emergency Use Authorization (EUA). This EUA will remain  in effect (meaning this test can be used) for the duration of the COVID-19  declaration under Section 564(b)(1) of the Act, 21 U.S.C.section 360bbb-3(b)(1), unless the authorization is terminated  or revoked sooner.       Influenza A by PCR NEGATIVE NEGATIVE Final   Influenza B by PCR NEGATIVE NEGATIVE Final    Comment: (NOTE) The Xpert Xpress SARS-CoV-2/FLU/RSV plus assay is intended as an aid in the diagnosis of influenza from Nasopharyngeal swab specimens and should not be used as a sole basis for treatment. Nasal washings and aspirates are unacceptable for Xpert Xpress SARS-CoV-2/FLU/RSV testing.  Fact Sheet for Patients: EntrepreneurPulse.com.au  Fact Sheet for Healthcare Providers: IncredibleEmployment.be  This test is not yet approved or cleared by the [place] FDA and has been authorized for detection and/or diagnosis of SARS-CoV-2 by FDA under an Emergency Use Authorization (EUA). This EUA will remain in effect (meaning this test can be used) for the duration of the COVID-19 declaration under Section 564(b)(1) of the Act, 21 U.S.C. section 360bbb-3(b)(1), unless the authorization is terminated or revoked.  Performed at [facility], [address] [place], [place] [address]     Coagulation Studies: No results for input(s): LABPROT, INR in the last 72 hours.  Urinalysis: No results for input(s): COLORURINE, LABSPEC, PHURINE, GLUCOSEU, HGBUR, BILIRUBINUR, KETONESUR, PROTEINUR, UROBILINOGEN, NITRITE, LEUKOCYTESUR in the last 72 hours.  Invalid input(s): APPERANCEUR    Imaging: DG Chest 2 View  Result Date: 02/09/2021 CLINICAL DATA:  Patient arrives from dialysis with midsternal chest pain, did not receive dialysis treatment. Elevated blood pressure and shortness of breath x2 days EXAM: CHEST - 2 VIEW COMPARISON:  Chest radiograph November 09, 2020 FINDINGS: Right IJ central venous catheter with tip overlying the right atrium. Stable mild cardiomegaly. Surgical clips overlie the  mediastinum. Left greater than right basilar and perihilar opacities. Tiny bilateral pleural effusions. The visualized skeletal structures are unchanged. IMPRESSION: Cardiomegaly with likely pulmonary edema and tiny bilateral effusions. Electronically Signed   By: Dahlia Bailiff MD   On: 02/09/2021 13:06     Medications:   Scheduled Meds: . amLODipine  10 mg Oral Daily  . aspirin EC  81 mg Oral Daily  . atorvastatin  80 mg Oral QHS  . carvedilol  25 mg Oral BID WC  . Chlorhexidine Gluconate Cloth  6 each Topical Q0600  . heparin  5,000 Units Subcutaneous Q8H  . insulin aspart  0-15 Units Subcutaneous TID WC  . isosorbide mononitrate  30 mg Oral Daily  . lisinopril  30 mg Oral Daily  . mirtazapine  30 mg Oral QHS  . pantoprazole  40 mg Oral Daily  . potassium chloride  10 mEq Oral Daily  . sodium chloride flush  3 mL Intravenous Q12H  . topiramate  25 mg Oral Daily  . torsemide  100 mg Oral Daily   Continuous Infusions: . sodium chloride     PRN Meds:.sodium chloride, acetaminophen, ammonium lactate, heparin, HYDROcodone-acetaminophen, mometasone-formoterol, ondansetron **OR** ondansetron (ZOFRAN) IV, sodium chloride flush     Assessment/ Plan:  60 y.o. female with COPD, coronary disease, depression, diabetes, end-stage renal disease,  GERD, heart failure with reduced ejection fraction, Covid 19 in April 2020, history of DVT, history of CABG, ventilatory adenoma, sleep apnea, history of stroke    admitted on 02/09/2021 for Acute pulmonary edema (HCC) [J81.0] Pleural effusion, bilateral [J90] Chest pain [R07.9] Chest pain, unspecified type [R07.9]  [facility] dialysis/MWF/78 kg/240 minutes / 78.5 kg  #Acute pulmonary edema in the setting of end-stage renal disease #End-stage renal disease #Anemia of chronic kidney disease #Hypertension, uncontrolled #Lower extremity edema #Secondary hyperparathyroidism  Lab Results  Component Value Date   CALCIUM 8.1 (L) 02/10/2021    CAION 1.13 (L) 01/28/2021   PHOS 3.4 11/12/2020     Plan: 1800 cc removed with dialysis yesterday Repeat hemodialysis treatment today for volume removal. Initially we were trying to attempt UF only treatment but later was changed to hemodialysis with 4K bath -  Consider dosing antihypertensives in the evening. -Monitor calcium and phosphorus during this admission -Holding Epogen administration due to uncontrolled blood pressure      LOS: 0 Harmeet Singh 2/22/20229:27 AM  [facility] [place], [place]  Note: This note was prepared with Dragon dictation. Any transcription errors are unintentional

## 2021-02-10 NOTE — Progress Notes (Signed)
[place] [facility] [place] NAME: Terry Arroyo    MR#:  [id number]  DATE OF BIRTH:  11/28/1961  SUBJECTIVE:  CHIEF COMPLAINT:   Chief Complaint  Patient presents with  . Chest Pain  denies any chest pain but reports some lower back pain (h/o accident 2 yrs ago but no vertebral #), denies any fall. Some SOB, waiting for HD, don't have much appetite  REVIEW OF SYSTEMS:  Review of Systems  Constitutional: Positive for malaise/fatigue. Negative for diaphoresis, fever and weight loss.  HENT: Negative for ear discharge, ear pain, hearing loss, nosebleeds, sore throat and tinnitus.   Eyes: Negative for blurred vision and pain.  Respiratory: Positive for shortness of breath. Negative for cough, hemoptysis and wheezing.   Cardiovascular: Positive for chest pain and leg swelling. Negative for palpitations and orthopnea.  Gastrointestinal: Negative for abdominal pain, blood in stool, constipation, diarrhea, heartburn, nausea and vomiting.  Genitourinary: Negative for dysuria, frequency and urgency.  Musculoskeletal: Negative for back pain and myalgias.  Skin: Negative for itching and rash.  Neurological: Negative for dizziness, tingling, tremors, focal weakness, seizures, weakness and headaches.  Psychiatric/Behavioral: Negative for depression. The patient is not nervous/anxious.    DRUG ALLERGIES:   Allergies  Allergen Reactions  . Hydralazine Shortness Of Breath and Swelling    Body aches  . Shellfish Allergy Anaphylaxis    Shrimp/lobster  Shrimp/lobster    . Kiwi Extract Swelling  . Metformin Other (See Comments)    GI Intolerance  . Tape Itching    Use paper tape whenever possible   VITALS:  Blood pressure (!) 176/85, pulse 62, temperature 99.2 F (37.3 C), temperature source Oral, resp. rate 19, height 5\' 6"  (1.676 m), weight 81.2 kg, SpO2 98 %. PHYSICAL EXAMINATION:  Physical Exam HENT:     Head: Normocephalic and atraumatic.  Eyes:     Extraocular  Movements: EOM normal.     Conjunctiva/sclera: Conjunctivae normal.     Pupils: Pupils are equal, round, and reactive to light.  Neck:     Thyroid: No thyromegaly.     Vascular: JVD present.     Trachea: No tracheal deviation.  Cardiovascular:     Rate and Rhythm: Normal rate and regular rhythm.     Heart sounds: Normal heart sounds.  Pulmonary:     Effort: Pulmonary effort is normal. No respiratory distress.     Breath sounds: Decreased breath sounds present. No wheezing.  Chest:     Chest wall: No tenderness.  Abdominal:     General: Bowel sounds are normal. There is no distension.     Palpations: Abdomen is soft.     Tenderness: There is no abdominal tenderness.  Musculoskeletal:        General: Normal range of motion.     Cervical back: Normal range of motion and neck supple.     Thoracic back: Spasms and tenderness present.       Back:     Right lower leg: Edema present.     Left lower leg: Edema present.  Skin:    General: Skin is warm and dry.     Findings: No rash.  Neurological:     Mental Status: She is alert and oriented to person, place, and time.     Cranial Nerves: No cranial nerve deficit.    access - Rt IJ permcath in place LABORATORY PANEL:  Female CBC Recent Labs  Lab 02/10/21 0356  WBC 6.7  HGB 10.6*  HCT 33.5*  PLT 289   ------------------------------------------------------------------------------------------------------------------ Chemistries  Recent Labs  Lab 02/10/21 0356  NA 136  K 3.3*  CL 100  CO2 27  GLUCOSE 300*  BUN 24*  CREATININE 2.84*  CALCIUM 8.1*   RADIOLOGY:  No results found. ASSESSMENT AND PLAN:  60 y.o. female with medical history significant for end-stage renal disease on hemodialysis, dialysis days are Monday/Wednesday/Friday, history of coronary artery disease, depression, COPD and diabetes mellitus admitted for chest pain  Chest pain In a patient with known coronary artery disease Chest pain appears to be  atypical and has resolved.  Patient has no EKG changes and troponin is flat Continue aspirin, nitrates, statins and beta-blockers  Acute on chronic combined systolic and diastolic dysfunction CHF Patient presents for evaluation of chest pain which has resolved but now is in respiratory distress at rest Chest x-ray shows pulmonary edema with bilateral pleural effusions Blood pressure remains significantly elevated Patient's last known LVEF is 40 to 45% (11/21) Expect improvement in patient's symptoms with HD Optimize blood pressure control  Diabetes mellitus with complications of end-stage renal disease on hemodialysis Patient's dialysis days are Monday/Wednesday/Friday Nephro planning for HD today. Maintain consistent carbohydrate diet Glycemic control with sliding scale insulin  Hypertension Continue amlodipine, carvedilol, Imdur and lisinopril Adjust as need after HD  Depression Continue mirtazapine  COPD Not acutely exacerbated Continue as needed bronchodilator therapy as well as inhaled steroids  GERD Continue Protonix  Body mass index is 28.89 kg/m.  Net IO Since Admission: -1,676 mL [02/10/21 1756]    Status is: Observation  The patient remains OBS appropriate and will d/c before 2 midnights.  Dispo: The patient is from: Home              Anticipated d/c is to: Home              Anticipated d/c date is: 1 day              Patient currently is not medically stable to d/c.   Difficult to place patient No   DVT prophylaxis:       heparin injection 5,000 Units Start: 02/09/21 2200     Family Communication:"discussed with patient"   All the records are reviewed and case discussed with Care Management/Social Worker. Management plans discussed with the patient, nursing, nephro and they are in agreement.  CODE STATUS: Full Code Level of care: Progressive Cardiac  TOTAL TIME TAKING CARE OF THIS PATIENT: 35 minutes.   More than 50% of the time was  spent in counseling/coordination of care: YES  POSSIBLE D/C IN 1-2 DAYS, DEPENDING ON CLINICAL CONDITION.   Max Sane M.D on 02/10/2021 at 5:56 PM  [facility]   CC: Primary care physician; Kirk Ruths, MD  Note: This dictation was prepared with Dragon dictation along with smaller phrase technology. Any transcriptional errors that result from this process are unintentional.

## 2021-02-10 NOTE — ED Notes (Signed)
Patient provided with a meal tray, and drink at her request. Patient pillow provided, as well as warm blanket.

## 2021-02-10 NOTE — Progress Notes (Signed)
Inpatient Diabetes Program Recommendations  AACE/ADA: New Consensus Statement on Inpatient Glycemic Control (2015)  Target Ranges:  Prepandial:   less than 140 mg/dL      Peak postprandial:   less than 180 mg/dL (1-2 hours)      Critically ill patients:  140 - 180 mg/dL   Lab Results  Component Value Date   GLUCAP 172 (H) 02/10/2021   HGBA1C 8.9 (H) 02/09/2021    Review of Glycemic Control Results for Terry Arroyo, Terry Arroyo (MRN [id number]) as of 02/10/2021 10:17  Ref. Range 02/09/2021 17:58 02/10/2021 02:48 02/10/2021 09:28  Glucose-Capillary Latest Ref Range: 70 - 99 mg/dL 194 (H) 244 (H) 172 (H)   Diabetes history: DM 2 Outpatient Diabetes medications:  70/30 15 units bid Current orders for Inpatient glycemic control:  Novolog moderate tid with meals Inpatient Diabetes Program Recommendations:    May consider adding Levemir 6 units bid and reduce Novolog correction to sensitive tid with meals.   Thanks,  Adah Perl, RN, BC-ADM Inpatient Diabetes Coordinator Pager [phone] (8a-5p)

## 2021-02-10 NOTE — ED Notes (Signed)
Patient given a meal. Glucose checked for meal. 244 to be treated per sliding scale.

## 2021-02-10 NOTE — Discharge Instructions (Signed)

## 2021-02-10 NOTE — Progress Notes (Addendum)
Patient arrived to unit alert and c/o some lower back pain pre HD. Temp was 99.2 oral, BP was in the 628'O systolic. Orders were in for SEQ/ UF only tx today, upon arrival. At initiation of HD, pt c/o " I feel cold" Dr. Candiss Norse was then called and made aware of vital signs and symptoms. SEQ setting does not allow for temp to be ste to 37 degrees. Dr. Candiss Norse then changed the order to a full HD for the remaining time on a 4k bath. With a temp of 37 degrees, bicarb 35, NA 137 for patient's comfort. See orders for complete details. Patient stated " I cramped yesterday, I can not tolerate 3L. Dr. Candiss Norse was made aware during call, Ok to set patient for 2.5L net per MD.

## 2021-02-10 NOTE — ED Notes (Signed)
Patient resting comfortably. Report given to [facility], [place].

## 2021-02-10 NOTE — Plan of Care (Signed)

## 2021-02-10 NOTE — Progress Notes (Signed)
Hemodialysis patient known at [place] MWF 11:45, patient stated no concerns about transportation or dialysis. Please contact me with any dialysis placement concerns.  Elvera Bicker Dialysis Coordinator [phone]

## 2021-02-11 DIAGNOSIS — R600 Localized edema: Secondary | ICD-10-CM | POA: Diagnosis not present

## 2021-02-11 DIAGNOSIS — Z794 Long term (current) use of insulin: Secondary | ICD-10-CM | POA: Diagnosis not present

## 2021-02-11 DIAGNOSIS — I25118 Atherosclerotic heart disease of native coronary artery with other forms of angina pectoris: Secondary | ICD-10-CM | POA: Diagnosis not present

## 2021-02-11 DIAGNOSIS — I5043 Acute on chronic combined systolic (congestive) and diastolic (congestive) heart failure: Secondary | ICD-10-CM | POA: Diagnosis not present

## 2021-02-11 DIAGNOSIS — Z992 Dependence on renal dialysis: Secondary | ICD-10-CM

## 2021-02-11 DIAGNOSIS — J810 Acute pulmonary edema: Secondary | ICD-10-CM | POA: Diagnosis not present

## 2021-02-11 DIAGNOSIS — E1122 Type 2 diabetes mellitus with diabetic chronic kidney disease: Secondary | ICD-10-CM

## 2021-02-11 DIAGNOSIS — R079 Chest pain, unspecified: Secondary | ICD-10-CM | POA: Diagnosis not present

## 2021-02-11 DIAGNOSIS — N186 End stage renal disease: Secondary | ICD-10-CM

## 2021-02-11 DIAGNOSIS — I10 Essential (primary) hypertension: Secondary | ICD-10-CM | POA: Diagnosis not present

## 2021-02-11 LAB — CBC
HCT: 36.5 % (ref 36.0–46.0)
Hemoglobin: 11.6 g/dL — ABNORMAL LOW (ref 12.0–15.0)
MCH: 27.6 pg (ref 26.0–34.0)
MCHC: 31.8 g/dL (ref 30.0–36.0)
MCV: 86.9 fL (ref 80.0–100.0)
Platelets: 304 10*3/uL (ref 150–400)
RBC: 4.2 MIL/uL (ref 3.87–5.11)
RDW: 13.7 % (ref 11.5–15.5)
WBC: 8.1 10*3/uL (ref 4.0–10.5)
nRBC: 0 % (ref 0.0–0.2)

## 2021-02-11 LAB — BASIC METABOLIC PANEL
Anion gap: 7 (ref 5–15)
BUN: 20 mg/dL (ref 6–20)
CO2: 28 mmol/L (ref 22–32)
Calcium: 8.2 mg/dL — ABNORMAL LOW (ref 8.9–10.3)
Chloride: 99 mmol/L (ref 98–111)
Creatinine, Ser: 2.84 mg/dL — ABNORMAL HIGH (ref 0.44–1.00)
GFR, Estimated: 19 mL/min — ABNORMAL LOW (ref >60)
Glucose, Bld: 185 mg/dL — ABNORMAL HIGH (ref 70–99)
Potassium: 3.4 mmol/L — ABNORMAL LOW (ref 3.5–5.1)
Sodium: 134 mmol/L — ABNORMAL LOW (ref 135–145)

## 2021-02-11 LAB — GLUCOSE, CAPILLARY
Glucose-Capillary: 184 mg/dL — ABNORMAL HIGH (ref 70–99)
Glucose-Capillary: 321 mg/dL — ABNORMAL HIGH (ref 70–99)

## 2021-02-11 LAB — HEPATITIS B SURFACE ANTIBODY, QUANTITATIVE: Hep B S AB Quant (Post): 43.2 m[IU]/mL (ref >9.9)

## 2021-02-11 MED ORDER — POLYETHYLENE GLYCOL 3350 17 G PO PACK: 17.0000 g | PACK | Freq: Every day | ORAL | 0 refills | Status: DC

## 2021-02-11 MED ORDER — POLYETHYLENE GLYCOL 3350 17 G PO PACK
17.0000 g | PACK | Freq: Every day | ORAL | Status: DC
  Administered 2021-02-11: 17 g via ORAL
  Filled 2021-02-11: qty 1

## 2021-02-11 NOTE — Progress Notes (Signed)
[place], [place] 02/11/21  Subjective:   Hospital day # 0 2/21- Patient presents to the emergency room for shortness of breath.  She was found to have severely elevated blood pressure and lower extremity edema.  Chest x-ray shows pulmonary edema.  Urgent nephrology consult is requested for hemodialysis.  Patient states that she did not miss her dialysis treatment she did go to her treatment on Friday.  HD on 2/21 removed 1700 cc. Patient was on 2 L. She requested to end treatment early because of cramping.  2/22-oral intake remains very poor.  Continues to have significant lower extremity edema.  Discontinued her treatment early yesterday because of cramping.  We discussed with patient about an extra treatment today for volume removal  Patient seen sitting on the side of bed States she feels bed today Able to a small amount of breakfast No nausea Denies shortness of breath and chest pain   Objective:  Vital signs in last 24 hours:  Temp:  [98.1 F (36.7 C)-99.2 F (37.3 C)] 98.5 F (36.9 C) (02/23 1233) Pulse Rate:  [62-80] 65 (02/23 1233) Resp:  [14-27] 19 (02/23 1233) BP: (132-188)/(74-105) 142/74 (02/23 1233) SpO2:  [95 %-100 %] 99 % (02/23 1233) Weight:  [72.6 kg] 72.6 kg (02/22 2121)  Weight change: -8.573 kg Filed Weights   02/09/21 1227 02/10/21 2121  Weight: 81.2 kg 72.6 kg    Intake/Output:    Intake/Output Summary (Last 24 hours) at 02/11/2021 1300 Last data filed at 02/10/2021 2048 Gross per 24 hour  Intake 120 ml  Output 2501 ml  Net -2381 ml    Physical Exam: General:  Moderate distress distress, laying in the bed  HEENT  anicteric, moist oral mucous membrane  Pulm/lungs  mild bilateral crackles, improved, [place] O2  CVS/Heart  regular rhythm, no rub or gallop  Abdomen:   Soft, nontender  Extremities:  2+ peripheral edema  Neurologic:  Alert, oriented, able to follow commands  Skin:  No acute rashes  Right IJ  PermCath    Basic Metabolic Panel:  Recent Labs  Lab 02/09/21 1229 02/10/21 0356 02/11/21 0358  NA 136 136 134*  K 3.7 3.3* 3.4*  CL 102 100 99  CO2 24 27 28   GLUCOSE 299* 300* 185*  BUN 41* 24* 20  CREATININE 3.71* 2.84* 2.84*  CALCIUM 9.1 8.1* 8.2*     CBC: Recent Labs  Lab 02/09/21 1229 02/10/21 0356 02/11/21 0358  WBC 9.4 6.7 8.1  HGB 11.2* 10.6* 11.6*  HCT 35.2* 33.5* 36.5  MCV 87.1 86.3 86.9  PLT 378 289 304      Lab Results  Component Value Date   HEPBSAG NON REACTIVE 02/10/2021   HEPBSAB Reactive (A) 11/09/2020   HEPBIGM NON REACTIVE 03/02/2020      Microbiology:  Recent Results (from the past 240 hour(s))  Resp Panel by RT-PCR (Flu A&B, Covid) Nasopharyngeal Swab     Status: None   Collection Time: 02/09/21  4:06 PM   Specimen: Nasopharyngeal Swab; Nasopharyngeal(NP) swabs in vial transport medium  Result Value Ref Range Status   SARS Coronavirus 2 by RT PCR NEGATIVE NEGATIVE Final    Comment: (NOTE) SARS-CoV-2 target nucleic acids are NOT DETECTED.  The SARS-CoV-2 RNA is generally detectable in upper respiratory specimens during the acute phase of infection. The lowest concentration of SARS-CoV-2 viral copies this assay can detect is 138 copies/mL. A negative result does not preclude SARS-Cov-2 infection and should not be used as the sole  basis for treatment or other patient management decisions. A negative result may occur with  improper specimen collection/handling, submission of specimen other than nasopharyngeal swab, presence of viral mutation(s) within the areas targeted by this assay, and inadequate number of viral copies(<138 copies/mL). A negative result must be combined with clinical observations, patient history, and epidemiological information. The expected result is Negative.  Fact Sheet for Patients:  EntrepreneurPulse.com.au  Fact Sheet for Healthcare Providers:   IncredibleEmployment.be  This test is no t yet approved or cleared by the [place] FDA and  has been authorized for detection and/or diagnosis of SARS-CoV-2 by FDA under an Emergency Use Authorization (EUA). This EUA will remain  in effect (meaning this test can be used) for the duration of the COVID-19 declaration under Section 564(b)(1) of the Act, 21 U.S.C.section 360bbb-3(b)(1), unless the authorization is terminated  or revoked sooner.       Influenza A by PCR NEGATIVE NEGATIVE Final   Influenza B by PCR NEGATIVE NEGATIVE Final    Comment: (NOTE) The Xpert Xpress SARS-CoV-2/FLU/RSV plus assay is intended as an aid in the diagnosis of influenza from Nasopharyngeal swab specimens and should not be used as a sole basis for treatment. Nasal washings and aspirates are unacceptable for Xpert Xpress SARS-CoV-2/FLU/RSV testing.  Fact Sheet for Patients: EntrepreneurPulse.com.au  Fact Sheet for Healthcare Providers: IncredibleEmployment.be  This test is not yet approved or cleared by the [place] FDA and has been authorized for detection and/or diagnosis of SARS-CoV-2 by FDA under an Emergency Use Authorization (EUA). This EUA will remain in effect (meaning this test can be used) for the duration of the COVID-19 declaration under Section 564(b)(1) of the Act, 21 U.S.C. section 360bbb-3(b)(1), unless the authorization is terminated or revoked.  Performed at [facility], [address] [place], [place] [address]     Coagulation Studies: No results for input(s): LABPROT, INR in the last 72 hours.  Urinalysis: No results for input(s): COLORURINE, LABSPEC, PHURINE, GLUCOSEU, HGBUR, BILIRUBINUR, KETONESUR, PROTEINUR, UROBILINOGEN, NITRITE, LEUKOCYTESUR in the last 72 hours.  Invalid input(s): APPERANCEUR    Imaging: No results found.   Medications:   Scheduled Meds: . amLODipine  10 mg Oral  Daily  . aspirin EC  81 mg Oral Daily  . atorvastatin  80 mg Oral QHS  . carvedilol  25 mg Oral BID WC  . Chlorhexidine Gluconate Cloth  6 each Topical Q0600  . heparin  5,000 Units Subcutaneous Q8H  . heparin sodium (porcine)  3,600 Units Intracatheter Once  . insulin aspart  0-15 Units Subcutaneous TID WC  . isosorbide mononitrate  30 mg Oral Daily  . lisinopril  30 mg Oral Daily  . mirtazapine  30 mg Oral QHS  . pantoprazole  40 mg Oral Daily  . polyethylene glycol  17 g Oral Daily  . potassium chloride  10 mEq Oral Daily  . sodium chloride flush  3 mL Intravenous Q12H  . topiramate  25 mg Oral Daily  . torsemide  100 mg Oral Daily   Continuous Infusions: . sodium chloride     PRN Meds:.sodium chloride, acetaminophen, ammonium lactate, HYDROcodone-acetaminophen, mometasone-formoterol, ondansetron **OR** ondansetron (ZOFRAN) IV, sodium chloride flush     Assessment/ Plan:  60 y.o. female with COPD, coronary disease, depression, diabetes, end-stage renal disease, GERD, heart failure with reduced ejection fraction, Covid 19 in April 2020, history of DVT, history of CABG, ventilatory adenoma, sleep apnea, history of stroke    admitted on 02/09/2021 for Acute pulmonary edema (  [place]) [J81.0] Pleural effusion, bilateral [J90] Chest pain [R07.9] Chest pain, unspecified type [R07.9]  [facility] dialysis/MWF/78 kg/240 minutes / 78.5 kg  #Acute pulmonary edema in the setting of end-stage renal disease #End-stage renal disease #Anemia of chronic kidney disease #Hypertension, uncontrolled #Lower extremity edema #Secondary hyperparathyroidism  Lab Results  Component Value Date   CALCIUM 8.2 (L) 02/11/2021   CAION 1.13 (L) 01/28/2021   PHOS 3.4 11/12/2020     Plan:  Received dialysis yesterday Removed 2.5L of fluid Initially we attempted UF only treatment but later was changed to hemodialysis with 4K bath - Consider dosing antihypertensives in the evening. -Monitor  calcium and phosphorus during this admission -We discussed with the patent the importance of monitoring fluid intake to prevent these events, empathizing with the difficulties caused by restrictive renal and diabetic diets. -She is cleared for d/c from our stance -Next treatment will be completed at her outpatient center      LOS: 0 Colon [place] 2/23/20221:00 PM  [place], [place]  Note: This note was prepared with Dragon dictation. Any transcription errors are unintentional

## 2021-02-11 NOTE — Progress Notes (Signed)
Patient discharged per orders, cab voucher provided. PIV and tele removed from patient. AVS reviewed. Patient taken down to medical mall by wheelchair.

## 2021-02-11 NOTE — TOC Transition Note (Signed)
Transition of Care [facility]) - CM/SW Discharge Note   Patient Details  Name: Terry Arroyo MRN: [id number] Date of Birth: 03-08-1961  [facility] CM/SW Contact:  Eileen Stanford, LCSW Phone Number: 02/11/2021, 1:16 PM   Clinical Narrative:  [facility] [place] arranged [facility]. [facility] aware of d/c today. No DME needs. No additional needs at this time.     Final next level of care: [place] Barriers to Discharge: No Barriers Identified   Patient Goals and CMS Choice Patient states their goals for this hospitalization and ongoing recovery are:: to go home   Choice offered to / list presented to : Patient  Discharge Placement                    Patient and family notified of of transfer: 02/11/21  Discharge Plan and Services In-house Referral: NA   Post Acute Care Choice: [place]: PT,Nurse's Aide [place]: [facility] Date [place]: 02/11/21 Time [place]: [place] Representative spoke with at [place]: [place] ([place]) Interventions     Readmission Risk Interventions Readmission Risk Prevention Plan 11/03/2020 10/23/2020 10/12/2020  Transportation Screening Complete Complete Complete  Medication Review Press photographer) Complete Complete Complete  PCP or Specialist appointment within 3-5 days of discharge Not Complete Complete Complete  PCP/Specialist Appt Not Complete comments To be scheduled by unit secretary. - -  HRI or Home Care Consult Complete Complete -  SW Recovery Care/Counseling Consult Complete Complete Complete  Palliative Care Screening Not Applicable Not Applicable Not Applicable  Skilled Nursing Facility Patient Refused Not Applicable Not Complete  SNF Comments Patient refuses states she is able to take care of herself. - Patient pending evaluation  Some recent data might be hidden

## 2021-02-11 NOTE — Evaluation (Signed)
Physical Therapy Evaluation Patient Details Name: Terry Arroyo MRN: [id number] DOB: [birth year] Today's Date: 02/11/2021   History of Present Illness  presented to ER secondary to CP (midsternal radiating to back), SOB; admitted for management for atypical chest pain (resolved), acute/chronic systolic and diastolic CHF.  Clinical Impression  Upon evaluation, patient alert and oriented; follows commands and agreeable to treatment session.  Endorses generalized soreness in bilat LEs, but denies specific mechanism of injury.  R UE strength and ROM generally guarded, mildly edematous due to recent fistula placement; otherwise, bilat UE/LE strength and ROM grossly WFL for basic transfers and gait.  Able to complete bed mobility with mod indep; sit/stand, basic transfers and gait (350') with RW, cga/close sup.  Demonstrates reciprocal stepping pattern with fair step height/length; mild sway with dynamic gait components (but self-corrects); slow and deliberate in cadence, endorses generalized fatigue with exertional activities.  HR 60-70s throughout; denies chest pain/pressure/palpitations throughout Would benefit from skilled PT to address above deficits and promote optimal return to PLOF.; Recommend transition to HHPT upon discharge from acute hospitalization.     Follow Up Recommendations Home health PT    Equipment Recommendations   (has [phone])    Recommendations for Other Services       Precautions / Restrictions Precautions Precaution Comments: R AVF, R perm-cath Restrictions Weight Bearing Restrictions: No      Mobility  Bed Mobility Overal bed mobility: Modified Independent                  Transfers Overall transfer level: Needs assistance Equipment used: Rolling walker (2 wheeled) Transfers: Sit to/from Stand Sit to Stand: Supervision         General transfer comment: requires UE support to safely complete  Ambulation/Gait Ambulation/Gait assistance:  Supervision;Min guard Gait Distance (Feet): 350 Feet Assistive device: Rolling walker (2 wheeled)   Gait velocity: 10' walk time, 12-13 seconds   General Gait Details: reciprocal stepping pattern with fair step height/length; mild sway with dynamic gait components (but self-corrects); slow and deliberate in cadence, endorses generalized fatigue with exertional activities.  HR 60-70s throughout; denies chest pain/pressure/palpitations throughout  Stairs            Wheelchair Mobility    Modified Rankin (Stroke Patients Only)       Balance Overall balance assessment: Needs assistance Sitting-balance support: No upper extremity supported;Feet supported Sitting balance-Leahy Scale: Good     Standing balance support: Bilateral upper extremity supported Standing balance-Leahy Scale: Fair                               Pertinent Vitals/Pain Pain Assessment: Faces Faces Pain Scale: Hurts little more Pain Location: bilat LEs Pain Descriptors / Indicators: Aching;Sore Pain Intervention(s): Limited activity within patient's tolerance;Monitored during session;Repositioned    Home Living Family/patient expects to be discharged to:: Private residence Living Arrangements: Alone Available Help at Discharge: Family;Available PRN/intermittently Type of Home: Apartment Home Access: Level entry;Elevator     Home Layout: One level Home Equipment: Cane - single point;Walker - 4 wheels      Prior Function Level of Independence: Independent with assistive device(s)         Comments: Ambulatory with [employer] for household/community distances; does not drive, uses public transportation to/from dialysis and received assistance with meals.  Denies fall history.     Hand Dominance   Dominant Hand: Left    Extremity/Trunk Assessment   Upper Extremity Assessment Upper  Extremity Assessment: Generalized weakness (R UE grossly 3-/5 (sore from recent AVF placement), mildly  edematous; L UE grossly 4+/5)    Lower Extremity Assessment Lower Extremity Assessment: Overall WFL for tasks assessed (grossly 4-/5 bilat; mild paresthesia reported toes bilat feet)       Communication   Communication: No difficulties (slightly mumbled at times)  Cognition Arousal/Alertness: Awake/alert Behavior During Therapy: WFL for tasks assessed/performed Overall Cognitive Status: Within Functional Limits for tasks assessed                                        General Comments      Exercises     Assessment/Plan    PT Assessment Patient needs continued PT services  PT Problem List Decreased activity tolerance;Decreased balance;Decreased mobility;Cardiopulmonary status limiting activity       PT Treatment Interventions Gait training;Stair training;Functional mobility training;Therapeutic activities;DME instruction;Therapeutic exercise;Balance training;Patient/family education    PT Goals (Current goals can be found in the Care Plan section)  Acute Rehab PT Goals Patient Stated Goal: to get back home PT Goal Formulation: With patient Time For Goal Achievement: 02/25/21 Potential to Achieve Goals: Good    Frequency Min 2X/week   Barriers to discharge        Co-evaluation               AM-PAC PT "6 Clicks" Mobility  Outcome Measure Help needed turning from your back to your side while in a flat bed without using bedrails?: None Help needed moving from lying on your back to sitting on the side of a flat bed without using bedrails?: None Help needed moving to and from a bed to a chair (including a wheelchair)?: None Help needed standing up from a chair using your arms (e.g., wheelchair or bedside chair)?: A Little Help needed to walk in hospital room?: A Little Help needed climbing 3-5 steps with a railing? : A Little 6 Click Score: 21    End of Session Equipment Utilized During Treatment: Gait belt Activity Tolerance: Patient tolerated  treatment well Patient left: in chair;with call bell/phone within reach;with chair alarm set Nurse Communication: Mobility status PT Visit Diagnosis: Muscle weakness (generalized) (M62.81);Difficulty in walking, not elsewhere classified (R26.2)    Time: 0240-9735 PT Time Calculation (min) (ACUTE ONLY): 16 min   Charges:   PT Evaluation $PT Eval Moderate Complexity: 1 Mod        Kelton Bultman H. Owens Shark, PT, DPT, NCS 02/11/21, 12:51 PM [phone]

## 2021-02-11 NOTE — Discharge Summary (Signed)
Physician Discharge Summary  Terry Arroyo VQX:[id number] DOB: 1961/04/27 DOA: 02/09/2021  PCP: Kirk Ruths, MD  Admit date: 02/09/2021 Discharge date: 02/11/2021  Admitted From: Home Disposition: Home  Recommendations for Outpatient Follow-up:  1. Follow up with PCP in 1-2 weeks 2. Follow-up in heart failure clinic 3. Follow-up for dialysis 4. Please obtain BMP/CBC in one week 5. Please follow up on the following pending results: None  Home Health: Yes Equipment/Devices: Rolling walker Discharge Condition: Stable CODE STATUS: Full Diet recommendation: Heart Healthy / Carb Modified   Brief/Interim Summary: Terry Arroyo is a 60 y.o. female with medical history significant for end-stage renal disease on hemodialysis, dialysis days are Monday/Wednesday/Friday, history of coronary artery disease, depression, COPD and diabetes mellitus who was sent to the ER via EMS for evaluation of chest pain.  Patient has an history of coronary artery disease.  Chest appears seems to be atypical and resolved.  No EKG changes and troponin with flat curve.  She will continue home dose of aspirin, nitrates, statin and beta-blocker and will follow up with her cardiologist.  Patient was found to have volume up, EF of 40 to 45% according to echo done in November 2021.  Volume is being managed with dialysis.  Patient missed 1 day of dialysis, stating that she was not feeling well.  She received dialysis with improvement in volume status.  She received 2 consecutive days of dialysis and per nephrology can miss her routine day on Wednesday and resume her schedule on Friday.  Complaint of feeling overall weak, PT evaluation was obtained and they were recommending home health services with PT which were ordered.  Patient continued to have elevated blood pressure.  She will continue her home meds which include amlodipine, carvedilol, Imdur and lisinopril.  She will continue rest of her home medications and  follow-up with her providers.  Discharge Diagnoses:  Principal Problem:   Chest pain Active Problems:   DM2 (diabetes mellitus, type 2) (HCC)   Acute on chronic combined systolic and diastolic CHF (congestive heart failure) (HCC)   CAD (coronary artery disease)   ESRD (end stage renal disease) (HCC)   Depression   COPD (chronic obstructive pulmonary disease) with emphysema ([place])   Acute pulmonary edema (HCC)   Discharge Instructions  Discharge Instructions    Diet - low sodium heart healthy   Complete by: As directed    Increase activity slowly   Complete by: As directed    Increase activity slowly   Complete by: As directed    It was pleasure taking care of you. Please go for your dialysis on Friday, no need for dialysis today as you received 2 consecutive days before. Follow-up in heart failure clinic.   No wound care   Complete by: As directed      Allergies as of 02/11/2021      Reactions   Hydralazine Shortness Of Breath, Swelling   Body aches   Shellfish Allergy Anaphylaxis   Shrimp/lobster  Shrimp/lobster    Kiwi Extract Swelling   Metformin Other (See Comments)   GI Intolerance   Tape Itching   Use paper tape whenever possible      Medication List    TAKE these medications   acetaminophen 500 MG tablet Commonly known as: TYLENOL Take 500-1,000 mg by mouth every 6 (six) hours as needed for mild pain or moderate pain.   amLODipine 10 MG tablet Commonly known as: NORVASC Take 1 tablet (10 mg total) by mouth daily.  ammonium lactate 12 % lotion Commonly known as: LAC-HYDRIN Apply 1 application topically as needed for dry skin.   aspirin 81 MG EC tablet Take 1 tablet (81 mg total) by mouth daily.   atorvastatin 80 MG tablet Commonly known as: LIPITOR Take 1 tablet (80 mg total) by mouth daily at 6 PM.   carvedilol 25 MG tablet Commonly known as: COREG Take 25 mg by mouth 2 (two) times daily with a meal.   fenofibrate 145 MG tablet Commonly  known as: TRICOR Take 145 mg by mouth daily.   furosemide 40 MG tablet Commonly known as: LASIX Take 40 mg by mouth.   HYDROcodone-acetaminophen 5-325 MG tablet Commonly known as: Norco Take 1-2 tablets by mouth every 6 (six) hours as needed.   insulin aspart protamine- aspart (70-30) 100 UNIT/ML injection Commonly known as: NOVOLOG MIX 70/30 Inject 0.15 mLs (15 Units total) into the skin 2 (two) times daily with a meal. What changed: when to take this   isosorbide mononitrate 30 MG 24 hr tablet Commonly known as: IMDUR Take 30 mg by mouth daily.   lisinopril 30 MG tablet Commonly known as: ZESTRIL Take 30 mg by mouth daily.   mirtazapine 30 MG tablet Commonly known as: REMERON Take 1 tablet (30 mg total) by mouth at bedtime.   mometasone-formoterol 200-5 MCG/ACT Aero Commonly known as: DULERA Inhale 2 puffs into the lungs 2 (two) times daily.   omeprazole 20 MG capsule Commonly known as: PRILOSEC Take 20 mg by mouth daily.   polyethylene glycol 17 g packet Commonly known as: MIRALAX / GLYCOLAX Take 17 g by mouth daily.   topiramate 25 MG tablet Commonly known as: TOPAMAX Take 25 mg by mouth daily.   torsemide 10 MG tablet Commonly known as: DEMADEX Take 2 tablets (20 mg total) by mouth daily.   vitamin B-12 1000 MCG tablet Commonly known as: CYANOCOBALAMIN Take 1,000 mcg by mouth daily.       Follow-up Information    Kirk Ruths, MD. Go on 02/16/2021.   Specialty: Internal Medicine Why: @ 4:15pm Contact information: [place] [address] [phone]        Yolonda Kida, MD. Go on 02/13/2021.   Specialties: Cardiology, Internal Medicine Why: @10 :00am Contact information: [place] [address] [phone]              Allergies  Allergen Reactions  . Hydralazine Shortness Of Breath and Swelling    Body aches  . Shellfish Allergy Anaphylaxis     Shrimp/lobster  Shrimp/lobster    . Kiwi Extract Swelling  . Metformin Other (See Comments)    GI Intolerance  . Tape Itching    Use paper tape whenever possible    Consultations:  Nephrology  Procedures/Studies: DG Chest 2 View  Result Date: 02/09/2021 CLINICAL DATA:  Patient arrives from dialysis with midsternal chest pain, did not receive dialysis treatment. Elevated blood pressure and shortness of breath x2 days EXAM: CHEST - 2 VIEW COMPARISON:  Chest radiograph November 09, 2020 FINDINGS: Right IJ central venous catheter with tip overlying the right atrium. Stable mild cardiomegaly. Surgical clips overlie the mediastinum. Left greater than right basilar and perihilar opacities. Tiny bilateral pleural effusions. The visualized skeletal structures are unchanged. IMPRESSION: Cardiomegaly with likely pulmonary edema and tiny bilateral effusions. Electronically Signed   By: Dahlia Bailiff MD   On: 02/09/2021 13:06     Subjective: Patient was seen and  examined today.  She was complaining of generalized weakness.  No chest pain or shortness of breath.  Discharge Exam: Vitals:   02/11/21 0317 02/11/21 0757  BP: (!) 161/75 (!) 173/82  Pulse: 68 67  Resp: 18 17  Temp: 98.2 F (36.8 C) 98.2 F (36.8 C)  SpO2: 96% 95%   Vitals:   02/10/21 2050 02/10/21 2121 02/11/21 0317 02/11/21 0757  BP: (!) 164/85 (!) 165/83 (!) 161/75 (!) 173/82  Pulse: 71 80 68 67  Resp: (!) 24 20 18 17   Temp:  98.1 F (36.7 C) 98.2 F (36.8 C) 98.2 F (36.8 C)  TempSrc:  Oral Axillary Oral  SpO2: 98% 97% 96% 95%  Weight:  72.6 kg    Height:        General: Pt is alert, awake, not in acute distress Cardiovascular: RRR, S1/S2 +, no rubs, no gallops Respiratory: CTA bilaterally, no wheezing, no rhonchi Abdominal: Soft, NT, ND, bowel sounds + Extremities: Trace LE edema, no cyanosis   The results of significant diagnostics from this hospitalization (including imaging, microbiology, ancillary and  laboratory) are listed below for reference.    Microbiology: Recent Results (from the past 240 hour(s))  Resp Panel by RT-PCR (Flu A&B, Covid) Nasopharyngeal Swab     Status: None   Collection Time: 02/09/21  4:06 PM   Specimen: Nasopharyngeal Swab; Nasopharyngeal(NP) swabs in vial transport medium  Result Value Ref Range Status   SARS Coronavirus 2 by RT PCR NEGATIVE NEGATIVE Final    Comment: (NOTE) SARS-CoV-2 target nucleic acids are NOT DETECTED.  The SARS-CoV-2 RNA is generally detectable in upper respiratory specimens during the acute phase of infection. The lowest concentration of SARS-CoV-2 viral copies this assay can detect is 138 copies/mL. A negative result does not preclude SARS-Cov-2 infection and should not be used as the sole basis for treatment or other patient management decisions. A negative result may occur with  improper specimen collection/handling, submission of specimen other than nasopharyngeal swab, presence of viral mutation(s) within the areas targeted by this assay, and inadequate number of viral copies(<138 copies/mL). A negative result must be combined with clinical observations, patient history, and epidemiological information. The expected result is Negative.  Fact Sheet for Patients:  EntrepreneurPulse.com.au  Fact Sheet for Healthcare Providers:  IncredibleEmployment.be  This test is no t yet approved or cleared by the [place] FDA and  has been authorized for detection and/or diagnosis of SARS-CoV-2 by FDA under an Emergency Use Authorization (EUA). This EUA will remain  in effect (meaning this test can be used) for the duration of the COVID-19 declaration under Section 564(b)(1) of the Act, 21 U.S.C.section 360bbb-3(b)(1), unless the authorization is terminated  or revoked sooner.       Influenza A by PCR NEGATIVE NEGATIVE Final   Influenza B by PCR NEGATIVE NEGATIVE Final    Comment: (NOTE) The  Xpert Xpress SARS-CoV-2/FLU/RSV plus assay is intended as an aid in the diagnosis of influenza from Nasopharyngeal swab specimens and should not be used as a sole basis for treatment. Nasal washings and aspirates are unacceptable for Xpert Xpress SARS-CoV-2/FLU/RSV testing.  Fact Sheet for Patients: EntrepreneurPulse.com.au  Fact Sheet for Healthcare Providers: IncredibleEmployment.be  This test is not yet approved or cleared by the [place] FDA and has been authorized for detection and/or diagnosis of SARS-CoV-2 by FDA under an Emergency Use Authorization (EUA). This EUA will remain in effect (meaning this test can be used) for the duration of the COVID-19 declaration under Section  564(b)(1) of the Act, 21 U.S.C. section 360bbb-3(b)(1), unless the authorization is terminated or revoked.  Performed at [facility], [address] [place], [place] [address]      Labs: BNP (last 3 results) Recent Labs    10/10/20 1134 10/21/20 1931 11/01/20 1520  BNP 1,251.0* 1,384.4* 7,322.0*   Basic Metabolic Panel: Recent Labs  Lab 02/09/21 1229 02/10/21 0356 02/11/21 0358  NA 136 136 134*  K 3.7 3.3* 3.4*  CL 102 100 99  CO2 24 27 28   GLUCOSE 299* 300* 185*  BUN 41* 24* 20  CREATININE 3.71* 2.84* 2.84*  CALCIUM 9.1 8.1* 8.2*   Liver Function Tests: No results for input(s): AST, ALT, ALKPHOS, BILITOT, PROT, ALBUMIN in the last 168 hours. No results for input(s): LIPASE, AMYLASE in the last 168 hours. No results for input(s): AMMONIA in the last 168 hours. CBC: Recent Labs  Lab 02/09/21 1229 02/10/21 0356 02/11/21 0358  WBC 9.4 6.7 8.1  HGB 11.2* 10.6* 11.6*  HCT 35.2* 33.5* 36.5  MCV 87.1 86.3 86.9  PLT 378 289 304   Cardiac Enzymes: No results for input(s): CKTOTAL, CKMB, CKMBINDEX, TROPONINI in the last 168 hours. BNP: Invalid input(s): POCBNP CBG: Recent Labs  Lab 02/10/21 0928 02/10/21 1156 02/10/21 1605  02/10/21 2122 02/11/21 0758  GLUCAP 172* 206* 120* 131* 184*   D-Dimer No results for input(s): DDIMER in the last 72 hours. Hgb A1c Recent Labs    02/09/21 1758  HGBA1C 8.9*   Lipid Profile No results for input(s): CHOL, HDL, LDLCALC, TRIG, CHOLHDL, LDLDIRECT in the last 72 hours. Thyroid function studies No results for input(s): TSH, T4TOTAL, T3FREE, THYROIDAB in the last 72 hours.  Invalid input(s): FREET3 Anemia work up No results for input(s): VITAMINB12, FOLATE, FERRITIN, TIBC, IRON, RETICCTPCT in the last 72 hours. Urinalysis    Component Value Date/Time   COLORURINE STRAW (A) 10/21/2020 0829   APPEARANCEUR CLEAR (A) 10/21/2020 0829   LABSPEC 1.007 10/21/2020 0829   PHURINE 7.0 10/21/2020 0829   GLUCOSEU 150 (A) 10/21/2020 0829   HGBUR NEGATIVE 10/21/2020 0829   BILIRUBINUR NEGATIVE 10/21/2020 0829   KETONESUR NEGATIVE 10/21/2020 0829   PROTEINUR >=300 (A) 10/21/2020 0829   NITRITE NEGATIVE 10/21/2020 0829   LEUKOCYTESUR SMALL (A) 10/21/2020 0829   Sepsis Labs Invalid input(s): PROCALCITONIN,  WBC,  LACTICIDVEN Microbiology Recent Results (from the past 240 hour(s))  Resp Panel by RT-PCR (Flu A&B, Covid) Nasopharyngeal Swab     Status: None   Collection Time: 02/09/21  4:06 PM   Specimen: Nasopharyngeal Swab; Nasopharyngeal(NP) swabs in vial transport medium  Result Value Ref Range Status   SARS Coronavirus 2 by RT PCR NEGATIVE NEGATIVE Final    Comment: (NOTE) SARS-CoV-2 target nucleic acids are NOT DETECTED.  The SARS-CoV-2 RNA is generally detectable in upper respiratory specimens during the acute phase of infection. The lowest concentration of SARS-CoV-2 viral copies this assay can detect is 138 copies/mL. A negative result does not preclude SARS-Cov-2 infection and should not be used as the sole basis for treatment or other patient management decisions. A negative result may occur with  improper specimen collection/handling, submission of specimen  other than nasopharyngeal swab, presence of viral mutation(s) within the areas targeted by this assay, and inadequate number of viral copies(<138 copies/mL). A negative result must be combined with clinical observations, patient history, and epidemiological information. The expected result is Negative.  Fact Sheet for Patients:  EntrepreneurPulse.com.au  Fact Sheet for Healthcare Providers:  IncredibleEmployment.be  This  test is no t yet approved or cleared by the [place] and  has been authorized for detection and/or diagnosis of SARS-CoV-2 by FDA under an Emergency Use Authorization (EUA). This EUA will remain  in effect (meaning this test can be used) for the duration of the COVID-19 declaration under Section 564(b)(1) of the Act, 21 U.S.C.section 360bbb-3(b)(1), unless the authorization is terminated  or revoked sooner.       Influenza A by PCR NEGATIVE NEGATIVE Final   Influenza B by PCR NEGATIVE NEGATIVE Final    Comment: (NOTE) The Xpert Xpress SARS-CoV-2/FLU/RSV plus assay is intended as an aid in the diagnosis of influenza from Nasopharyngeal swab specimens and should not be used as a sole basis for treatment. Nasal washings and aspirates are unacceptable for Xpert Xpress SARS-CoV-2/FLU/RSV testing.  Fact Sheet for Patients: EntrepreneurPulse.com.au  Fact Sheet for Healthcare Providers: IncredibleEmployment.be  This test is not yet approved or cleared by the [place] FDA and has been authorized for detection and/or diagnosis of SARS-CoV-2 by FDA under an Emergency Use Authorization (EUA). This EUA will remain in effect (meaning this test can be used) for the duration of the COVID-19 declaration under Section 564(b)(1) of the Act, 21 U.S.C. section 360bbb-3(b)(1), unless the authorization is terminated or revoked.  Performed at [facility], [address]  [place], [place] [address]     Time coordinating discharge: Over 30 minutes  SIGNED:  Lorella Nimrod, MD  [facility] 02/11/2021, 12:27 PM  If 7PM-7AM, please contact night-coverage [email or web address]  This record has been created using Systems analyst. Errors have been sought and corrected,but may not always be located. Such creation errors do not reflect on the standard of care.

## 2021-02-11 NOTE — Plan of Care (Signed)
  Problem: Education: Goal: Knowledge of disease and its progression will improve Outcome: Adequate for Discharge Goal: Individualized Educational Video(s) Outcome: Adequate for Discharge   Problem: Fluid Volume: Goal: Compliance with measures to maintain balanced fluid volume will improve Outcome: Adequate for Discharge   Problem: Health Behavior/Discharge Planning: Goal: Ability to manage health-related needs will improve Outcome: Adequate for Discharge   Problem: Nutritional: Goal: Ability to make healthy dietary choices will improve Outcome: Adequate for Discharge   Problem: Clinical Measurements: Goal: Complications related to the disease process, condition or treatment will be avoided or minimized Outcome: Adequate for Discharge

## 2021-02-11 NOTE — TOC Initial Note (Signed)
Transition of Care [facility]) - Initial/Assessment Note    Patient Details  Name: Terry Arroyo MRN: [id number] Date of Birth: [birth year]  Transition of Care [facility]) CM/SW Contact:    Eileen Stanford, LCSW Phone Number: 02/11/2021, 1:13 PM  Clinical Narrative: CSW spoke with pt at bedside. Pt states she is agreeable to [facility]. Pt states she had [facility] in the past however, they never came so she wants to use another agency. CSW read the options aloud and the pt is agreeable to [facility]. CSW has provided the referral to [facility]. Pt dc today.                  Expected Discharge Plan: [place] Barriers to Discharge: No Barriers Identified   Patient Goals and CMS Choice Patient states their goals for this hospitalization and ongoing recovery are:: to go home   Choice offered to / list presented to : Patient  Expected Discharge Plan and Services Expected Discharge Plan: [place] In-house Referral: NA   Post Acute Care Choice: [place] arrangements for the past 2 months: Single Family Home Expected Discharge Date: 02/11/21                         [facility] Arranged: PT,Nurse's Aide HH Agency: [facility] [employer]) Date [place]: 02/11/21 Time [place]: [place] Representative spoke with at [place]: Helene Kelp  Prior Living Arrangements/Services Living arrangements for the past 2 months: [place] Lives with:: Self Patient language and need for interpreter reviewed:: Yes Do you feel safe going back to the place where you live?: Yes      Need for Family Participation in Patient Care: Yes (Comment) Care giver support system in place?: Yes (comment)   Criminal Activity/Legal Involvement Pertinent to Current Situation/Hospitalization: No - Comment as needed  Activities of Daily Living      Permission Sought/Granted Permission sought to share information with : Family Supports Permission  granted to share information with : Yes, Verbal Permission Granted     Permission granted to share info w AGENCY: [facility]        Emotional Assessment Appearance:: Appears stated age Attitude/Demeanor/Rapport: Engaged Affect (typically observed): Accepting,Appropriate Orientation: : Oriented to Place,Oriented to Self,Oriented to  Time,Oriented to Situation Alcohol / Substance Use: Not Applicable Psych Involvement: No (comment)  Admission diagnosis:  Acute pulmonary edema (HCC) [J81.0] Pleural effusion, bilateral [J90] Chest pain [R07.9] Chest pain, unspecified type [R07.9] Patient Active Problem List   Diagnosis Date Noted  . Acute pulmonary edema (HCC)   . COPD (chronic obstructive pulmonary disease) with emphysema ([place]) 02/09/2021  . Chronic sphenoidal sinusitis 01/01/2021  . Contact dermatitis and other eczema due to other specified agent 01/01/2021  . Hydronephrosis, left 01/01/2021  . Other diseases of nasal cavity and sinuses(478.19) 01/01/2021  . ESRD (end stage renal disease) ([place])   . Reactive thrombocytosis 11/08/2020  . Nephrotic syndrome 11/04/2020  . SOB (shortness of breath) 11/01/2020  . Acute on chronic combined systolic and diastolic CHF (congestive heart failure) ([place]) 10/21/2020  . CAD (coronary artery disease) 10/21/2020  . Hyperlipidemia associated with type 2 diabetes mellitus ([place]) 10/21/2020  . HFrEF (heart failure with reduced ejection fraction) ([place])   . Acute respiratory distress   . COPD exacerbation ([place]) 10/10/2020  . Kidney hematoma 09/13/2020  . Hypertension associated with diabetes ([place])   . Acute renal failure superimposed on  stage 4 chronic kidney disease ([place])   . Hematuria 09/12/2020  . 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 Blind left eye 09/03/2020  . Brain aneurysm 09/03/2020  . Esotropia 09/03/2020  . Lesion of ulnar nerve 09/03/2020  . Mixed hyperlipidemia 09/03/2020  . Sensory hearing  loss, bilateral 09/03/2020  . Tear film insufficiency 09/03/2020  . Health maintenance examination 06/27/2020  . Secondary hyperparathyroidism of renal origin ([place]) 04/24/2020  . Anemia in chronic kidney disease 03/10/2020  . Benign hypertensive kidney disease with chronic kidney disease 03/10/2020  . Hyposmolality and/or hyponatremia 03/10/2020  . Stage 3b chronic kidney disease ([place]) 03/10/2020  . Calf tenderness   . Acute on chronic heart failure ([place]) 02/27/2020  . Leg edema   . DM2 (diabetes mellitus, type 2) ([place]) 02/26/2020  . CKD (chronic kidney disease) stage 4, GFR 15-29 ml/min (HCC) 02/26/2020  . Hyponatremia 02/26/2020  . Anasarca 02/26/2020  . Proteinuria 02/26/2020  . Hypoalbuminemia 02/26/2020  . Elevated troponin 02/26/2020  . Acute on chronic systolic CHF (congestive heart failure) ([place]) 02/26/2020  . Aortic atherosclerosis ([place]) 02/18/2020  . Hyperglycemia   . Nausea   . Hypertensive emergency   . Hypertensive urgency 11/24/2019  . Chest pain 11/23/2019  . OSA (obstructive sleep apnea) 04/10/2019  . Mild episode of recurrent major depressive disorder ([place]) 04/10/2019  . ACS (acute coronary syndrome) ([place]) 03/24/2019  . Pneumonia due to COVID-19 virus 03/24/2019  . Hypertensive heart/kidney disease w/chronic kidney disease stage III ([place]) 10/21/2017  . Mild nonproliferative diabetic retinopathy of right eye associated with type 2 diabetes mellitus ([place]) 09/09/2017  . Genital herpes 05/07/2017  . DVT (deep venous thrombosis) ([place]) 12/30/2016  . PVD (posterior vitreous detachment), right eye 01/09/2016  . Diabetic peripheral angiopathy ([place]) 01/03/2016  . Age-related nuclear cataract of both eyes 10/24/2015  . Chronic nonintractable headache 10/02/2015  . Dry eyes, bilateral 02/13/2015  . Proptosis 12/18/2014  . Iritis of right eye 12/03/2014  . Anemia 08/30/2014  . Depression 08/30/2014   PCP:  Kirk Ruths, MD Pharmacy:   [facility] [address] [place] [address] [place] [address] Phone: [phone] Fax: [facility] [place], [place] [address] Phone: [phone] Fax: [phone]     Social Determinants of Health (SDOH) Interventions    Readmission Risk Interventions Readmission Risk Prevention Plan 11/03/2020 10/23/2020 10/12/2020  Transportation Screening Complete Complete Complete  Medication Review (RN Care Manager) Complete Complete Complete  PCP or Specialist appointment within 3-5 days of discharge Not Complete Complete Complete  PCP/Specialist Appt Not Complete comments To be scheduled by unit secretary. - -  HRI or Home Care Consult Complete Complete -  SW Recovery Care/Counseling Consult Complete Complete Complete  Palliative Care Screening Not Applicable Not Applicable Not Applicable  Skilled Nursing Facility Patient Refused Not Applicable Not Complete  SNF Comments Patient refuses states she is able to take care of herself. - Patient pending evaluation  Some recent data might be hidden

## 2021-02-12 ENCOUNTER — Ambulatory Visit: Payer: Medicare HMO | Admitting: Family

## 2021-02-12 LAB — HEPATITIS B SURFACE ANTIBODY, QUANTITATIVE: Hep B S AB Quant (Post): 49 m[IU]/mL (ref >9.9)

## 2021-02-12 LAB — HEPATITIS B DNA, ULTRAQUANTITATIVE, PCR
HBV DNA SERPL PCR-ACNC: NOT DETECTED IU/mL
HBV DNA SERPL PCR-LOG IU: UNDETERMINED log10 IU/mL

## 2021-02-13 DIAGNOSIS — N186 End stage renal disease: Secondary | ICD-10-CM | POA: Diagnosis not present

## 2021-02-13 DIAGNOSIS — Z992 Dependence on renal dialysis: Secondary | ICD-10-CM | POA: Diagnosis not present

## 2021-02-16 DIAGNOSIS — Z992 Dependence on renal dialysis: Secondary | ICD-10-CM | POA: Diagnosis not present

## 2021-02-16 DIAGNOSIS — N186 End stage renal disease: Secondary | ICD-10-CM | POA: Diagnosis not present

## 2021-02-17 DIAGNOSIS — I5042 Chronic combined systolic (congestive) and diastolic (congestive) heart failure: Secondary | ICD-10-CM | POA: Diagnosis not present

## 2021-02-17 DIAGNOSIS — E785 Hyperlipidemia, unspecified: Secondary | ICD-10-CM | POA: Diagnosis not present

## 2021-02-17 DIAGNOSIS — I130 Hypertensive heart and chronic kidney disease with heart failure and stage 1 through stage 4 chronic kidney disease, or unspecified chronic kidney disease: Secondary | ICD-10-CM | POA: Diagnosis not present

## 2021-02-17 DIAGNOSIS — G4733 Obstructive sleep apnea (adult) (pediatric): Secondary | ICD-10-CM | POA: Diagnosis not present

## 2021-02-17 DIAGNOSIS — H548 Legal blindness, as defined in USA: Secondary | ICD-10-CM | POA: Diagnosis not present

## 2021-02-17 DIAGNOSIS — N186 End stage renal disease: Secondary | ICD-10-CM | POA: Diagnosis not present

## 2021-02-17 DIAGNOSIS — Z992 Dependence on renal dialysis: Secondary | ICD-10-CM | POA: Diagnosis not present

## 2021-02-17 DIAGNOSIS — E1122 Type 2 diabetes mellitus with diabetic chronic kidney disease: Secondary | ICD-10-CM | POA: Diagnosis not present

## 2021-02-17 DIAGNOSIS — I2510 Atherosclerotic heart disease of native coronary artery without angina pectoris: Secondary | ICD-10-CM | POA: Diagnosis not present

## 2021-02-17 DIAGNOSIS — J449 Chronic obstructive pulmonary disease, unspecified: Secondary | ICD-10-CM | POA: Diagnosis not present

## 2021-02-17 DIAGNOSIS — Z794 Long term (current) use of insulin: Secondary | ICD-10-CM | POA: Diagnosis not present

## 2021-02-18 ENCOUNTER — Encounter (HOSPITAL_COMMUNITY): Payer: Self-pay

## 2021-02-18 DIAGNOSIS — Z992 Dependence on renal dialysis: Secondary | ICD-10-CM | POA: Diagnosis not present

## 2021-02-18 DIAGNOSIS — N186 End stage renal disease: Secondary | ICD-10-CM | POA: Diagnosis not present

## 2021-02-18 NOTE — Progress Notes (Signed)
Contacted Terry Arroyo to make a home visit.  She states she is unable to talk now, she is grocery shopping.  She did states that [facility] has visited but unable to give her an aid because her house was too clean, they stated she does not need help.  She was upset about it.  She advised will call me back when she is home.   [place] [phone]

## 2021-02-19 ENCOUNTER — Encounter (INDEPENDENT_AMBULATORY_CARE_PROVIDER_SITE_OTHER): Payer: Medicare HMO

## 2021-02-19 ENCOUNTER — Ambulatory Visit (INDEPENDENT_AMBULATORY_CARE_PROVIDER_SITE_OTHER): Payer: Medicare HMO | Admitting: Nurse Practitioner

## 2021-02-19 DIAGNOSIS — I25118 Atherosclerotic heart disease of native coronary artery with other forms of angina pectoris: Secondary | ICD-10-CM | POA: Diagnosis not present

## 2021-02-19 DIAGNOSIS — N186 End stage renal disease: Secondary | ICD-10-CM | POA: Diagnosis not present

## 2021-02-19 DIAGNOSIS — R0600 Dyspnea, unspecified: Secondary | ICD-10-CM | POA: Diagnosis not present

## 2021-02-19 DIAGNOSIS — R609 Edema, unspecified: Secondary | ICD-10-CM | POA: Diagnosis not present

## 2021-02-19 DIAGNOSIS — Z951 Presence of aortocoronary bypass graft: Secondary | ICD-10-CM | POA: Diagnosis not present

## 2021-02-19 DIAGNOSIS — I700 Atherosclerosis of aorta: Secondary | ICD-10-CM | POA: Diagnosis not present

## 2021-02-19 DIAGNOSIS — E113293 Type 2 diabetes mellitus with mild nonproliferative diabetic retinopathy without macular edema, bilateral: Secondary | ICD-10-CM | POA: Diagnosis not present

## 2021-02-19 DIAGNOSIS — I10 Essential (primary) hypertension: Secondary | ICD-10-CM | POA: Diagnosis not present

## 2021-02-19 DIAGNOSIS — E782 Mixed hyperlipidemia: Secondary | ICD-10-CM | POA: Diagnosis not present

## 2021-02-20 DIAGNOSIS — N186 End stage renal disease: Secondary | ICD-10-CM | POA: Diagnosis not present

## 2021-02-20 DIAGNOSIS — Z992 Dependence on renal dialysis: Secondary | ICD-10-CM | POA: Diagnosis not present

## 2021-02-23 DIAGNOSIS — Z992 Dependence on renal dialysis: Secondary | ICD-10-CM | POA: Diagnosis not present

## 2021-02-23 DIAGNOSIS — N186 End stage renal disease: Secondary | ICD-10-CM | POA: Diagnosis not present

## 2021-02-24 DIAGNOSIS — I132 Hypertensive heart and chronic kidney disease with heart failure and with stage 5 chronic kidney disease, or end stage renal disease: Secondary | ICD-10-CM | POA: Diagnosis not present

## 2021-02-24 DIAGNOSIS — I2510 Atherosclerotic heart disease of native coronary artery without angina pectoris: Secondary | ICD-10-CM | POA: Diagnosis not present

## 2021-02-24 DIAGNOSIS — I5043 Acute on chronic combined systolic (congestive) and diastolic (congestive) heart failure: Secondary | ICD-10-CM | POA: Diagnosis not present

## 2021-02-24 DIAGNOSIS — J449 Chronic obstructive pulmonary disease, unspecified: Secondary | ICD-10-CM | POA: Diagnosis not present

## 2021-02-24 DIAGNOSIS — Z992 Dependence on renal dialysis: Secondary | ICD-10-CM | POA: Diagnosis not present

## 2021-02-24 DIAGNOSIS — F419 Anxiety disorder, unspecified: Secondary | ICD-10-CM | POA: Diagnosis not present

## 2021-02-24 DIAGNOSIS — F32A Depression, unspecified: Secondary | ICD-10-CM | POA: Diagnosis not present

## 2021-02-24 DIAGNOSIS — D631 Anemia in chronic kidney disease: Secondary | ICD-10-CM | POA: Diagnosis not present

## 2021-02-24 DIAGNOSIS — E1122 Type 2 diabetes mellitus with diabetic chronic kidney disease: Secondary | ICD-10-CM | POA: Diagnosis not present

## 2021-02-24 DIAGNOSIS — I25118 Atherosclerotic heart disease of native coronary artery with other forms of angina pectoris: Secondary | ICD-10-CM | POA: Diagnosis not present

## 2021-02-24 DIAGNOSIS — N186 End stage renal disease: Secondary | ICD-10-CM | POA: Diagnosis not present

## 2021-02-24 DIAGNOSIS — Z794 Long term (current) use of insulin: Secondary | ICD-10-CM | POA: Diagnosis not present

## 2021-02-24 DIAGNOSIS — F330 Major depressive disorder, recurrent, mild: Secondary | ICD-10-CM | POA: Diagnosis not present

## 2021-02-25 DIAGNOSIS — Z992 Dependence on renal dialysis: Secondary | ICD-10-CM | POA: Diagnosis not present

## 2021-02-25 DIAGNOSIS — N186 End stage renal disease: Secondary | ICD-10-CM | POA: Diagnosis not present

## 2021-02-26 DIAGNOSIS — I132 Hypertensive heart and chronic kidney disease with heart failure and with stage 5 chronic kidney disease, or end stage renal disease: Secondary | ICD-10-CM | POA: Diagnosis not present

## 2021-02-26 DIAGNOSIS — D631 Anemia in chronic kidney disease: Secondary | ICD-10-CM | POA: Diagnosis not present

## 2021-02-26 DIAGNOSIS — I5042 Chronic combined systolic (congestive) and diastolic (congestive) heart failure: Secondary | ICD-10-CM | POA: Diagnosis not present

## 2021-02-26 DIAGNOSIS — J449 Chronic obstructive pulmonary disease, unspecified: Secondary | ICD-10-CM | POA: Diagnosis not present

## 2021-02-26 DIAGNOSIS — H548 Legal blindness, as defined in USA: Secondary | ICD-10-CM | POA: Diagnosis not present

## 2021-02-26 DIAGNOSIS — F419 Anxiety disorder, unspecified: Secondary | ICD-10-CM | POA: Diagnosis not present

## 2021-02-26 DIAGNOSIS — E785 Hyperlipidemia, unspecified: Secondary | ICD-10-CM | POA: Diagnosis not present

## 2021-02-26 DIAGNOSIS — E1122 Type 2 diabetes mellitus with diabetic chronic kidney disease: Secondary | ICD-10-CM | POA: Diagnosis not present

## 2021-02-26 DIAGNOSIS — G4733 Obstructive sleep apnea (adult) (pediatric): Secondary | ICD-10-CM | POA: Diagnosis not present

## 2021-02-26 DIAGNOSIS — F32A Depression, unspecified: Secondary | ICD-10-CM | POA: Diagnosis not present

## 2021-02-26 DIAGNOSIS — I130 Hypertensive heart and chronic kidney disease with heart failure and stage 1 through stage 4 chronic kidney disease, or unspecified chronic kidney disease: Secondary | ICD-10-CM | POA: Diagnosis not present

## 2021-02-26 DIAGNOSIS — I5043 Acute on chronic combined systolic (congestive) and diastolic (congestive) heart failure: Secondary | ICD-10-CM | POA: Diagnosis not present

## 2021-02-26 DIAGNOSIS — N186 End stage renal disease: Secondary | ICD-10-CM | POA: Diagnosis not present

## 2021-02-26 DIAGNOSIS — I2510 Atherosclerotic heart disease of native coronary artery without angina pectoris: Secondary | ICD-10-CM | POA: Diagnosis not present

## 2021-02-27 DIAGNOSIS — Z992 Dependence on renal dialysis: Secondary | ICD-10-CM | POA: Diagnosis not present

## 2021-02-27 DIAGNOSIS — N186 End stage renal disease: Secondary | ICD-10-CM | POA: Diagnosis not present

## 2021-02-28 DIAGNOSIS — I130 Hypertensive heart and chronic kidney disease with heart failure and stage 1 through stage 4 chronic kidney disease, or unspecified chronic kidney disease: Secondary | ICD-10-CM | POA: Diagnosis not present

## 2021-02-28 DIAGNOSIS — G4733 Obstructive sleep apnea (adult) (pediatric): Secondary | ICD-10-CM | POA: Diagnosis not present

## 2021-02-28 DIAGNOSIS — E1122 Type 2 diabetes mellitus with diabetic chronic kidney disease: Secondary | ICD-10-CM | POA: Diagnosis not present

## 2021-02-28 DIAGNOSIS — I5042 Chronic combined systolic (congestive) and diastolic (congestive) heart failure: Secondary | ICD-10-CM | POA: Diagnosis not present

## 2021-02-28 DIAGNOSIS — H548 Legal blindness, as defined in USA: Secondary | ICD-10-CM | POA: Diagnosis not present

## 2021-02-28 DIAGNOSIS — J449 Chronic obstructive pulmonary disease, unspecified: Secondary | ICD-10-CM | POA: Diagnosis not present

## 2021-02-28 DIAGNOSIS — N186 End stage renal disease: Secondary | ICD-10-CM | POA: Diagnosis not present

## 2021-02-28 DIAGNOSIS — I2510 Atherosclerotic heart disease of native coronary artery without angina pectoris: Secondary | ICD-10-CM | POA: Diagnosis not present

## 2021-02-28 DIAGNOSIS — E785 Hyperlipidemia, unspecified: Secondary | ICD-10-CM | POA: Diagnosis not present

## 2021-03-02 DIAGNOSIS — Z992 Dependence on renal dialysis: Secondary | ICD-10-CM | POA: Diagnosis not present

## 2021-03-02 DIAGNOSIS — N186 End stage renal disease: Secondary | ICD-10-CM | POA: Diagnosis not present

## 2021-03-04 DIAGNOSIS — N186 End stage renal disease: Secondary | ICD-10-CM | POA: Diagnosis not present

## 2021-03-04 DIAGNOSIS — Z992 Dependence on renal dialysis: Secondary | ICD-10-CM | POA: Diagnosis not present

## 2021-03-05 DIAGNOSIS — I2510 Atherosclerotic heart disease of native coronary artery without angina pectoris: Secondary | ICD-10-CM | POA: Diagnosis not present

## 2021-03-05 DIAGNOSIS — F419 Anxiety disorder, unspecified: Secondary | ICD-10-CM | POA: Diagnosis not present

## 2021-03-05 DIAGNOSIS — E1122 Type 2 diabetes mellitus with diabetic chronic kidney disease: Secondary | ICD-10-CM | POA: Diagnosis not present

## 2021-03-05 DIAGNOSIS — D631 Anemia in chronic kidney disease: Secondary | ICD-10-CM | POA: Diagnosis not present

## 2021-03-05 DIAGNOSIS — I5043 Acute on chronic combined systolic (congestive) and diastolic (congestive) heart failure: Secondary | ICD-10-CM | POA: Diagnosis not present

## 2021-03-05 DIAGNOSIS — J449 Chronic obstructive pulmonary disease, unspecified: Secondary | ICD-10-CM | POA: Diagnosis not present

## 2021-03-05 DIAGNOSIS — F32A Depression, unspecified: Secondary | ICD-10-CM | POA: Diagnosis not present

## 2021-03-05 DIAGNOSIS — N186 End stage renal disease: Secondary | ICD-10-CM | POA: Diagnosis not present

## 2021-03-05 DIAGNOSIS — I132 Hypertensive heart and chronic kidney disease with heart failure and with stage 5 chronic kidney disease, or end stage renal disease: Secondary | ICD-10-CM | POA: Diagnosis not present

## 2021-03-06 DIAGNOSIS — N186 End stage renal disease: Secondary | ICD-10-CM | POA: Diagnosis not present

## 2021-03-06 DIAGNOSIS — Z992 Dependence on renal dialysis: Secondary | ICD-10-CM | POA: Diagnosis not present

## 2021-03-07 DIAGNOSIS — I5042 Chronic combined systolic (congestive) and diastolic (congestive) heart failure: Secondary | ICD-10-CM | POA: Diagnosis not present

## 2021-03-07 DIAGNOSIS — E1122 Type 2 diabetes mellitus with diabetic chronic kidney disease: Secondary | ICD-10-CM | POA: Diagnosis not present

## 2021-03-07 DIAGNOSIS — G4733 Obstructive sleep apnea (adult) (pediatric): Secondary | ICD-10-CM | POA: Diagnosis not present

## 2021-03-07 DIAGNOSIS — J449 Chronic obstructive pulmonary disease, unspecified: Secondary | ICD-10-CM | POA: Diagnosis not present

## 2021-03-07 DIAGNOSIS — N186 End stage renal disease: Secondary | ICD-10-CM | POA: Diagnosis not present

## 2021-03-07 DIAGNOSIS — I2510 Atherosclerotic heart disease of native coronary artery without angina pectoris: Secondary | ICD-10-CM | POA: Diagnosis not present

## 2021-03-07 DIAGNOSIS — I130 Hypertensive heart and chronic kidney disease with heart failure and stage 1 through stage 4 chronic kidney disease, or unspecified chronic kidney disease: Secondary | ICD-10-CM | POA: Diagnosis not present

## 2021-03-07 DIAGNOSIS — E785 Hyperlipidemia, unspecified: Secondary | ICD-10-CM | POA: Diagnosis not present

## 2021-03-07 DIAGNOSIS — H548 Legal blindness, as defined in USA: Secondary | ICD-10-CM | POA: Diagnosis not present

## 2021-03-09 ENCOUNTER — Other Ambulatory Visit (INDEPENDENT_AMBULATORY_CARE_PROVIDER_SITE_OTHER): Payer: Self-pay | Admitting: Vascular Surgery

## 2021-03-09 DIAGNOSIS — Z992 Dependence on renal dialysis: Secondary | ICD-10-CM | POA: Diagnosis not present

## 2021-03-09 DIAGNOSIS — N186 End stage renal disease: Secondary | ICD-10-CM | POA: Diagnosis not present

## 2021-03-09 DIAGNOSIS — Z9889 Other specified postprocedural states: Secondary | ICD-10-CM

## 2021-03-10 ENCOUNTER — Ambulatory Visit (INDEPENDENT_AMBULATORY_CARE_PROVIDER_SITE_OTHER): Payer: Medicare HMO | Admitting: Nurse Practitioner

## 2021-03-10 ENCOUNTER — Encounter (INDEPENDENT_AMBULATORY_CARE_PROVIDER_SITE_OTHER): Payer: Self-pay | Admitting: Nurse Practitioner

## 2021-03-10 ENCOUNTER — Ambulatory Visit (INDEPENDENT_AMBULATORY_CARE_PROVIDER_SITE_OTHER): Payer: Medicare HMO

## 2021-03-10 ENCOUNTER — Other Ambulatory Visit: Payer: Self-pay

## 2021-03-10 VITALS — BP 138/77 | HR 62 | Ht 66.0 in | Wt 168.0 lb

## 2021-03-10 DIAGNOSIS — I5043 Acute on chronic combined systolic (congestive) and diastolic (congestive) heart failure: Secondary | ICD-10-CM | POA: Diagnosis not present

## 2021-03-10 DIAGNOSIS — D631 Anemia in chronic kidney disease: Secondary | ICD-10-CM | POA: Diagnosis not present

## 2021-03-10 DIAGNOSIS — Z9889 Other specified postprocedural states: Secondary | ICD-10-CM | POA: Diagnosis not present

## 2021-03-10 DIAGNOSIS — N186 End stage renal disease: Secondary | ICD-10-CM | POA: Diagnosis not present

## 2021-03-10 DIAGNOSIS — I132 Hypertensive heart and chronic kidney disease with heart failure and with stage 5 chronic kidney disease, or end stage renal disease: Secondary | ICD-10-CM | POA: Diagnosis not present

## 2021-03-10 DIAGNOSIS — N183 Chronic kidney disease, stage 3 unspecified: Secondary | ICD-10-CM

## 2021-03-10 DIAGNOSIS — Z794 Long term (current) use of insulin: Secondary | ICD-10-CM

## 2021-03-10 DIAGNOSIS — F32A Depression, unspecified: Secondary | ICD-10-CM | POA: Diagnosis not present

## 2021-03-10 DIAGNOSIS — I152 Hypertension secondary to endocrine disorders: Secondary | ICD-10-CM

## 2021-03-10 DIAGNOSIS — E1122 Type 2 diabetes mellitus with diabetic chronic kidney disease: Secondary | ICD-10-CM | POA: Diagnosis not present

## 2021-03-10 DIAGNOSIS — J449 Chronic obstructive pulmonary disease, unspecified: Secondary | ICD-10-CM | POA: Diagnosis not present

## 2021-03-10 DIAGNOSIS — E1159 Type 2 diabetes mellitus with other circulatory complications: Secondary | ICD-10-CM

## 2021-03-10 DIAGNOSIS — I2510 Atherosclerotic heart disease of native coronary artery without angina pectoris: Secondary | ICD-10-CM | POA: Diagnosis not present

## 2021-03-10 DIAGNOSIS — F419 Anxiety disorder, unspecified: Secondary | ICD-10-CM | POA: Diagnosis not present

## 2021-03-11 DIAGNOSIS — Z992 Dependence on renal dialysis: Secondary | ICD-10-CM | POA: Diagnosis not present

## 2021-03-11 DIAGNOSIS — N186 End stage renal disease: Secondary | ICD-10-CM | POA: Diagnosis not present

## 2021-03-12 DIAGNOSIS — E782 Mixed hyperlipidemia: Secondary | ICD-10-CM | POA: Diagnosis not present

## 2021-03-12 DIAGNOSIS — I10 Essential (primary) hypertension: Secondary | ICD-10-CM | POA: Diagnosis not present

## 2021-03-12 DIAGNOSIS — I25118 Atherosclerotic heart disease of native coronary artery with other forms of angina pectoris: Secondary | ICD-10-CM | POA: Diagnosis not present

## 2021-03-12 DIAGNOSIS — I5042 Chronic combined systolic (congestive) and diastolic (congestive) heart failure: Secondary | ICD-10-CM | POA: Diagnosis not present

## 2021-03-12 DIAGNOSIS — N186 End stage renal disease: Secondary | ICD-10-CM | POA: Diagnosis not present

## 2021-03-12 DIAGNOSIS — E1122 Type 2 diabetes mellitus with diabetic chronic kidney disease: Secondary | ICD-10-CM | POA: Diagnosis not present

## 2021-03-12 DIAGNOSIS — I132 Hypertensive heart and chronic kidney disease with heart failure and with stage 5 chronic kidney disease, or end stage renal disease: Secondary | ICD-10-CM | POA: Diagnosis not present

## 2021-03-12 DIAGNOSIS — I2510 Atherosclerotic heart disease of native coronary artery without angina pectoris: Secondary | ICD-10-CM | POA: Diagnosis not present

## 2021-03-12 DIAGNOSIS — D631 Anemia in chronic kidney disease: Secondary | ICD-10-CM | POA: Diagnosis not present

## 2021-03-12 DIAGNOSIS — H548 Legal blindness, as defined in USA: Secondary | ICD-10-CM | POA: Diagnosis not present

## 2021-03-12 DIAGNOSIS — I5043 Acute on chronic combined systolic (congestive) and diastolic (congestive) heart failure: Secondary | ICD-10-CM | POA: Diagnosis not present

## 2021-03-12 DIAGNOSIS — F32A Depression, unspecified: Secondary | ICD-10-CM | POA: Diagnosis not present

## 2021-03-12 DIAGNOSIS — J449 Chronic obstructive pulmonary disease, unspecified: Secondary | ICD-10-CM | POA: Diagnosis not present

## 2021-03-12 DIAGNOSIS — I130 Hypertensive heart and chronic kidney disease with heart failure and stage 1 through stage 4 chronic kidney disease, or unspecified chronic kidney disease: Secondary | ICD-10-CM | POA: Diagnosis not present

## 2021-03-12 DIAGNOSIS — R0600 Dyspnea, unspecified: Secondary | ICD-10-CM | POA: Diagnosis not present

## 2021-03-12 DIAGNOSIS — E113293 Type 2 diabetes mellitus with mild nonproliferative diabetic retinopathy without macular edema, bilateral: Secondary | ICD-10-CM | POA: Diagnosis not present

## 2021-03-12 DIAGNOSIS — E785 Hyperlipidemia, unspecified: Secondary | ICD-10-CM | POA: Diagnosis not present

## 2021-03-12 DIAGNOSIS — G4733 Obstructive sleep apnea (adult) (pediatric): Secondary | ICD-10-CM | POA: Diagnosis not present

## 2021-03-12 DIAGNOSIS — R609 Edema, unspecified: Secondary | ICD-10-CM | POA: Diagnosis not present

## 2021-03-12 DIAGNOSIS — I700 Atherosclerosis of aorta: Secondary | ICD-10-CM | POA: Diagnosis not present

## 2021-03-12 DIAGNOSIS — Z951 Presence of aortocoronary bypass graft: Secondary | ICD-10-CM | POA: Diagnosis not present

## 2021-03-12 DIAGNOSIS — F419 Anxiety disorder, unspecified: Secondary | ICD-10-CM | POA: Diagnosis not present

## 2021-03-13 DIAGNOSIS — Z992 Dependence on renal dialysis: Secondary | ICD-10-CM | POA: Diagnosis not present

## 2021-03-13 DIAGNOSIS — N186 End stage renal disease: Secondary | ICD-10-CM | POA: Diagnosis not present

## 2021-03-15 ENCOUNTER — Encounter (INDEPENDENT_AMBULATORY_CARE_PROVIDER_SITE_OTHER): Payer: Self-pay | Admitting: Nurse Practitioner

## 2021-03-15 NOTE — Progress Notes (Signed)
Subjective:    Patient ID: Terry Arroyo, female    DOB: 1961/01/28, 60 y.o.   MRN: [id number] Chief Complaint  Patient presents with   Follow-up    4 week  [facility] post AV fistula creation . U/S    The patient returns today following right brachiocephalic AV fistula placement on 01/28/2021.  The wound is healed.  However the fistula has a good thrill and bruit but it is not easily distinguished with palpation.  The patient denies any episodes of hypotension.  She denies any issues with hypotension.  Her PermCath is working well and she has not missed any episodes of dialysis.  Her PermCath is currently in her right chest.  Today the patient has a flow volume of 885.  No area of significant stenosis seen.   Review of Systems  All other systems reviewed and are negative.      Objective:   Physical Exam Vitals reviewed.  HENT:     Head: Normocephalic.  Cardiovascular:     Rate and Rhythm: Normal rate.     Pulses: Normal pulses.     Arteriovenous access: right arteriovenous access is present.    Comments: Good thrill and bruit Pulmonary:     Effort: Pulmonary effort is normal.  Skin:    General: Skin is warm and dry.  Neurological:     Mental Status: She is alert and oriented to person, place, and time.  Psychiatric:        Mood and Affect: Mood normal.        Behavior: Behavior normal.        Thought Content: Thought content normal.        Judgment: Judgment normal.     BP 138/77    Pulse 62    Ht 5\' 6"  (1.676 m)    Wt 168 lb (76.2 kg)    BMI 27.12 kg/m   Past Medical History:  Diagnosis Date   Anemia    Angina at rest [facility])    Anterior cerebral aneurysm    approximately 5 mm; left MCA    Anxiety    Asthma    Blind left eye    COPD (chronic obstructive pulmonary disease) (HCC)    Coronary artery disease    Depression    Diabetes mellitus without complication (HCC)    ESRD (end stage renal disease) (HCC)    GERD (gastroesophageal reflux disease)     HFrEF (heart failure with reduced ejection fraction) ([place])    History of 2019 novel coronavirus disease (COVID-19) 03/2019   [place]   History of DVT (deep vein thrombosis)    Hx of CABG    Hyperlipidemia    Hypertension    MI (myocardial infarction) ([place]) 2018   Pituitary adenoma ([place])    Sleep apnea    non-compliant with nocturnal PAP therapy   Stroke [facility]) 2014   stated affected left side.    Social History   Socioeconomic History   Marital status: Single    Spouse name: Not on file   Number of children: Not on file   Years of education: Not on file   Highest education level: Not on file  Occupational History   Not on file  Tobacco Use   Smoking status: Former Smoker    Quit date: 2018    Years since quitting: 4.2   Smokeless tobacco: Never Used  Scientific laboratory technician Use: Never used  Substance and Sexual  Activity   Alcohol use: Not Currently   Drug use: Not Currently   Sexual activity: Not Currently  Other Topics Concern   Not on file  Social History Narrative   Not on file   Social Determinants of Health   Financial Resource Strain: Not on file  Food Insecurity: Not on file  Transportation Needs: Not on file  Physical Activity: Not on file  Stress: Not on file  Social Connections: Not on file  Intimate Partner Violence: Not on file    Past Surgical History:  Procedure Laterality Date   AV FISTULA PLACEMENT Right 01/28/2021   Procedure: ARTERIOVENOUS (AV) FISTULA CREATION;  Surgeon: Katha Cabal, MD;  Location: [facility] ORS;  Service: Vascular;  Laterality: Right;   [place]   pituitary tumor   BREAST EXCISIONAL BIOPSY Left yrs ago   benign   [place]  2018   done in [place] N/A 11/07/2020   Procedure: DIALYSIS/PERMA CATHETER INSERTION;  Surgeon: Algernon Huxley, MD;  Location: [place] CV LAB;   Service: Cardiovascular;  Laterality: N/A;   EYE SURGERY Right 2021   cataracts     Family History  Problem Relation Age of Onset   CAD Mother    CAD Brother    Breast cancer Sister 59    Allergies  Allergen Reactions   Hydralazine Shortness Of Breath and Swelling    Body aches   Shellfish Allergy Anaphylaxis    Shrimp/lobster  Shrimp/lobster     Kiwi Extract Swelling   Metformin Other (See Comments)    GI Intolerance   Tape Itching    Use paper tape whenever possible    CBC Latest Ref Rng & Units 02/11/2021 02/10/2021 02/09/2021  WBC 4.0 - 10.5 K/uL 8.1 6.7 9.4  Hemoglobin 12.0 - 15.0 g/dL 11.6(L) 10.6(L) 11.2(L)  Hematocrit 36.0 - 46.0 % 36.5 33.5(L) 35.2(L)  Platelets 150 - 400 K/uL 304 289 378      CMP     Component Value Date/Time   NA 134 (L) 02/11/2021 0358   NA 134 (L) 11/18/2013 0138   K 3.4 (L) 02/11/2021 0358   K 3.8 11/18/2013 0138   CL 99 02/11/2021 0358   CL 98 11/18/2013 0138   CO2 28 02/11/2021 0358   CO2 29 11/18/2013 0138   GLUCOSE 185 (H) 02/11/2021 0358   GLUCOSE 348 (H) 11/18/2013 0138   BUN 20 02/11/2021 0358   BUN 11 11/18/2013 0138   CREATININE 2.84 (H) 02/11/2021 0358   CREATININE 0.87 11/18/2013 0138   CALCIUM 8.2 (L) 02/11/2021 0358   CALCIUM 9.4 11/18/2013 0138   PROT 6.6 01/28/2021 0717   ALBUMIN 2.6 (L) 01/28/2021 0717   AST 12 (L) 01/28/2021 0717   ALT 10 01/28/2021 0717   ALKPHOS 80 01/28/2021 0717   BILITOT 0.4 01/28/2021 0717   GFRNONAA 19 (L) 02/11/2021 0358   GFRNONAA >60 11/18/2013 0138   GFRAA 18 (L) 09/15/2020 0403   GFRAA >60 11/18/2013 0138     No results found.     Assessment & Plan:   1. ESRD (end stage renal disease) ([place]) Today the patient's access has a low flow volume would be expected her brachiocephalic AV fistula however the fistula is also not readily palpable.  The fistula may need additional time to mature.  We will have the patient return in 5 weeks in order to reevaluate her access  with  an additional HDA.  Patient is advised to utilize a stress ball to squeeze to try to help with fistula maturation.  If fistula continues to show signs of delayed maturation patient will need a fistulogram to assist with fistula maturation.  2. Hypertension associated with diabetes ([place]) Continue antihypertensive medications as already ordered, these medications have been reviewed and there are no changes at this time.   3. Type 2 diabetes mellitus with stage 3 chronic kidney disease, with long-term current use of insulin, unspecified whether stage 3a or 3b CKD ([place]) Continue hypoglycemic medications as already ordered, these medications have been reviewed and there are no changes at this time.  Hgb A1C to be monitored as already arranged by primary service    Current Outpatient Medications on File Prior to Visit  Medication Sig Dispense Refill   ACCU-CHEK AVIVA PLUS test strip      Accu-Chek Softclix Lancets lancets      acetaminophen (TYLENOL) 500 MG tablet Take 500-1,000 mg by mouth every 6 (six) hours as needed for mild pain or moderate pain.     amLODipine (NORVASC) 10 MG tablet Take 1 tablet (10 mg total) by mouth daily. 30 tablet 0   ammonium lactate (LAC-HYDRIN) 12 % lotion Apply 1 application topically as needed for dry skin.     aspirin 81 MG EC tablet Take 1 tablet (81 mg total) by mouth daily. 30 tablet 0   atorvastatin (LIPITOR) 80 MG tablet Take 1 tablet (80 mg total) by mouth daily at 6 PM. 30 tablet 0   carvedilol (COREG) 25 MG tablet Take 25 mg by mouth 2 (two) times daily with a meal.      fenofibrate (TRICOR) 145 MG tablet Take 145 mg by mouth daily.     furosemide (LASIX) 40 MG tablet Take 40 mg by mouth.     HYDROcodone-acetaminophen (NORCO) 5-325 MG tablet Take 1-2 tablets by mouth every 6 (six) hours as needed. 50 tablet 0   insulin aspart protamine- aspart (NOVOLOG MIX 70/30) (70-30) 100 UNIT/ML injection Inject 0.15 mLs (15 Units total) into the skin  2 (two) times daily with a meal. (Patient taking differently: Inject 15 Units into the skin 3 (three) times daily with meals.) 10 mL 0   isosorbide mononitrate (IMDUR) 30 MG 24 hr tablet Take 30 mg by mouth daily.     lisinopril (ZESTRIL) 30 MG tablet Take 30 mg by mouth daily.     mirtazapine (REMERON) 30 MG tablet Take 1 tablet (30 mg total) by mouth at bedtime. 30 tablet 0   mometasone-formoterol (DULERA) 200-5 MCG/ACT AERO Inhale 2 puffs into the lungs 2 (two) times daily.     omeprazole (PRILOSEC) 20 MG capsule Take 20 mg by mouth daily.      polyethylene glycol (MIRALAX / GLYCOLAX) 17 g packet Take 17 g by mouth daily. 14 each 0   topiramate (TOPAMAX) 25 MG tablet Take 25 mg by mouth daily.     torsemide (DEMADEX) 10 MG tablet Take 2 tablets (20 mg total) by mouth daily. 60 tablet 0   vitamin B-12 (CYANOCOBALAMIN) 1000 MCG tablet Take 1,000 mcg by mouth daily.     No current facility-administered medications on file prior to visit.    There are no Patient Instructions on file for this visit. No follow-ups on file.   Kris Hartmann, NP

## 2021-03-15 NOTE — H&P (View-Only) (Signed)
Subjective:    Patient ID: [name], female    DOB: 05/22/1961, 60 y.o.   MRN: [id number] Chief Complaint  Patient presents with  . Follow-up    4 week  [facility] post AV fistula creation . U/S    The patient returns today following right brachiocephalic AV fistula placement on 01/28/2021.  The wound is healed.  However the fistula has a good thrill and bruit but it is not easily distinguished with palpation.  The patient denies any episodes of hypotension.  She denies any issues with hypotension.  Her PermCath is working well and she has not missed any episodes of dialysis.  Her PermCath is currently in her right chest.  Today the patient has a flow volume of 885.  No area of significant stenosis seen.   Review of Systems  All other systems reviewed and are negative.      Objective:   Physical Exam Vitals reviewed.  HENT:     Head: Normocephalic.  Cardiovascular:     Rate and Rhythm: Normal rate.     Pulses: Normal pulses.     Arteriovenous access: right arteriovenous access is present.    Comments: Good thrill and bruit Pulmonary:     Effort: Pulmonary effort is normal.  Skin:    General: Skin is warm and dry.  Neurological:     Mental Status: She is alert and oriented to person, place, and time.  Psychiatric:        Mood and Affect: Mood normal.        Behavior: Behavior normal.        Thought Content: Thought content normal.        Judgment: Judgment normal.     BP 138/77   Pulse 62   Ht 5\' 6"  (1.676 m)   Wt 168 lb (76.2 kg)   BMI 27.12 kg/m   Past Medical History:  Diagnosis Date  . Anemia   . Angina at rest [facility])   . Anterior cerebral aneurysm    approximately 5 mm; left MCA   . Anxiety   . Asthma   . Blind left eye   . COPD (chronic obstructive pulmonary disease) ([place])   . Coronary artery disease   . Depression   . Diabetes mellitus without complication ([place])   . ESRD (end stage renal disease) ([place])   . GERD (gastroesophageal reflux disease)    . HFrEF (heart failure with reduced ejection fraction) ([place])   . History of 2019 novel coronavirus disease (COVID-19) 03/2019   [place]  . History of DVT (deep vein thrombosis)   . Hx of CABG   . Hyperlipidemia   . Hypertension   . MI (myocardial infarction) ([place]) 2018  . Pituitary adenoma ([place])   . Sleep apnea    non-compliant with nocturnal PAP therapy  . Stroke [facility]) 2014   stated affected left side.    Social History   Socioeconomic History  . Marital status: Single    Spouse name: Not on file  . Number of children: Not on file  . Years of education: Not on file  . Highest education level: Not on file  Occupational History  . Not on file  Tobacco Use  . Smoking status: Former Smoker    Quit date: 2018    Years since quitting: 4.2  . Smokeless tobacco: Never Used  Vaping Use  . Vaping Use: Never used  Substance and Sexual Activity  . Alcohol  use: Not Currently  . Drug use: Not Currently  . Sexual activity: Not Currently  Other Topics Concern  . Not on file  Social History Narrative  . Not on file   Social Determinants of Health   Financial Resource Strain: Not on file  Food Insecurity: Not on file  Transportation Needs: Not on file  Physical Activity: Not on file  Stress: Not on file  Social Connections: Not on file  Intimate Partner Violence: Not on file    Past Surgical History:  Procedure Laterality Date  . AV FISTULA PLACEMENT Right 01/28/2021   Procedure: ARTERIOVENOUS (AV) FISTULA CREATION;  Surgeon: Katha Cabal, MD;  Location: [facility] ORS;  Service: Vascular;  Laterality: Right;  . BRAIN SURGERY  1986   pituitary tumor  . BREAST EXCISIONAL BIOPSY Left yrs ago   benign  . CESAREAN SECTION    . CORONARY ARTERY BYPASS GRAFT  2018   done in [place]  . DIALYSIS/PERMA CATHETER INSERTION N/A 11/07/2020   Procedure: DIALYSIS/PERMA CATHETER INSERTION;  Surgeon: Algernon Huxley, MD;  Location: [place] CV LAB;   Service: Cardiovascular;  Laterality: N/A;  . EYE SURGERY Right 2021   cataracts     Family History  Problem Relation Age of Onset  . CAD Mother   . CAD Brother   . Breast cancer Sister 47    Allergies  Allergen Reactions  . Hydralazine Shortness Of Breath and Swelling    Body aches  . Shellfish Allergy Anaphylaxis    Shrimp/lobster  Shrimp/lobster    . Kiwi Extract Swelling  . Metformin Other (See Comments)    GI Intolerance  . Tape Itching    Use paper tape whenever possible    CBC Latest Ref Rng & Units 02/11/2021 02/10/2021 02/09/2021  WBC 4.0 - 10.5 K/uL 8.1 6.7 9.4  Hemoglobin 12.0 - 15.0 g/dL 11.6(L) 10.6(L) 11.2(L)  Hematocrit 36.0 - 46.0 % 36.5 33.5(L) 35.2(L)  Platelets 150 - 400 K/uL 304 289 378      CMP     Component Value Date/Time   NA 134 (L) 02/11/2021 0358   NA 134 (L) 11/18/2013 0138   K 3.4 (L) 02/11/2021 0358   K 3.8 11/18/2013 0138   CL 99 02/11/2021 0358   CL 98 11/18/2013 0138   CO2 28 02/11/2021 0358   CO2 29 11/18/2013 0138   GLUCOSE 185 (H) 02/11/2021 0358   GLUCOSE 348 (H) 11/18/2013 0138   BUN 20 02/11/2021 0358   BUN 11 11/18/2013 0138   CREATININE 2.84 (H) 02/11/2021 0358   CREATININE 0.87 11/18/2013 0138   CALCIUM 8.2 (L) 02/11/2021 0358   CALCIUM 9.4 11/18/2013 0138   PROT 6.6 01/28/2021 0717   ALBUMIN 2.6 (L) 01/28/2021 0717   AST 12 (L) 01/28/2021 0717   ALT 10 01/28/2021 0717   ALKPHOS 80 01/28/2021 0717   BILITOT 0.4 01/28/2021 0717   GFRNONAA 19 (L) 02/11/2021 0358   GFRNONAA >60 11/18/2013 0138   GFRAA 18 (L) 09/15/2020 0403   GFRAA >60 11/18/2013 0138     No results found.     Assessment & Plan:   1. ESRD (end stage renal disease) ([place]) Today the patient's access has a low flow volume would be expected her brachiocephalic AV fistula however the fistula is also not readily palpable.  The fistula may need additional time to mature.  We will have the patient return in 5 weeks in order to reevaluate her access  with an additional HDA.  Patient is advised to utilize a stress ball to squeeze to try to help with fistula maturation.  If fistula continues to show signs of delayed maturation patient will need a fistulogram to assist with fistula maturation.  2. Hypertension associated with diabetes ([place]) Continue antihypertensive medications as already ordered, these medications have been reviewed and there are no changes at this time.   3. Type 2 diabetes mellitus with stage 3 chronic kidney disease, with long-term current use of insulin, unspecified whether stage 3a or 3b CKD ([place]) Continue hypoglycemic medications as already ordered, these medications have been reviewed and there are no changes at this time.  Hgb A1C to be monitored as already arranged by primary service    Current Outpatient Medications on File Prior to Visit  Medication Sig Dispense Refill  . ACCU-CHEK AVIVA PLUS test strip     . Accu-Chek Softclix Lancets lancets     . acetaminophen (TYLENOL) 500 MG tablet Take 500-1,000 mg by mouth every 6 (six) hours as needed for mild pain or moderate pain.    Marland Kitchen amLODipine (NORVASC) 10 MG tablet Take 1 tablet (10 mg total) by mouth daily. 30 tablet 0  . ammonium lactate (LAC-HYDRIN) 12 % lotion Apply 1 application topically as needed for dry skin.    Marland Kitchen aspirin 81 MG EC tablet Take 1 tablet (81 mg total) by mouth daily. 30 tablet 0  . atorvastatin (LIPITOR) 80 MG tablet Take 1 tablet (80 mg total) by mouth daily at 6 PM. 30 tablet 0  . carvedilol (COREG) 25 MG tablet Take 25 mg by mouth 2 (two) times daily with a meal.     . fenofibrate (TRICOR) 145 MG tablet Take 145 mg by mouth daily.    . furosemide (LASIX) 40 MG tablet Take 40 mg by mouth.    Marland Kitchen HYDROcodone-acetaminophen (NORCO) 5-325 MG tablet Take 1-2 tablets by mouth every 6 (six) hours as needed. 50 tablet 0  . insulin aspart protamine- aspart (NOVOLOG MIX 70/30) (70-30) 100 UNIT/ML injection Inject 0.15 mLs (15 Units total) into the skin  2 (two) times daily with a meal. (Patient taking differently: Inject 15 Units into the skin 3 (three) times daily with meals.) 10 mL 0  . isosorbide mononitrate (IMDUR) 30 MG 24 hr tablet Take 30 mg by mouth daily.    Marland Kitchen lisinopril (ZESTRIL) 30 MG tablet Take 30 mg by mouth daily.    . mirtazapine (REMERON) 30 MG tablet Take 1 tablet (30 mg total) by mouth at bedtime. 30 tablet 0  . mometasone-formoterol (DULERA) 200-5 MCG/ACT AERO Inhale 2 puffs into the lungs 2 (two) times daily.    Marland Kitchen omeprazole (PRILOSEC) 20 MG capsule Take 20 mg by mouth daily.     . polyethylene glycol (MIRALAX / GLYCOLAX) 17 g packet Take 17 g by mouth daily. 14 each 0  . topiramate (TOPAMAX) 25 MG tablet Take 25 mg by mouth daily.    Marland Kitchen torsemide (DEMADEX) 10 MG tablet Take 2 tablets (20 mg total) by mouth daily. 60 tablet 0  . vitamin B-12 (CYANOCOBALAMIN) 1000 MCG tablet Take 1,000 mcg by mouth daily.     No current facility-administered medications on file prior to visit.    There are no Patient Instructions on file for this visit. No follow-ups on file.   Kris Hartmann, NP

## 2021-03-16 DIAGNOSIS — N186 End stage renal disease: Secondary | ICD-10-CM | POA: Diagnosis not present

## 2021-03-16 DIAGNOSIS — Z992 Dependence on renal dialysis: Secondary | ICD-10-CM | POA: Diagnosis not present

## 2021-03-17 ENCOUNTER — Encounter (HOSPITAL_COMMUNITY): Payer: Self-pay

## 2021-03-17 ENCOUNTER — Other Ambulatory Visit (HOSPITAL_COMMUNITY): Payer: Self-pay

## 2021-03-17 DIAGNOSIS — I2510 Atherosclerotic heart disease of native coronary artery without angina pectoris: Secondary | ICD-10-CM | POA: Diagnosis not present

## 2021-03-17 DIAGNOSIS — D631 Anemia in chronic kidney disease: Secondary | ICD-10-CM | POA: Diagnosis not present

## 2021-03-17 DIAGNOSIS — N186 End stage renal disease: Secondary | ICD-10-CM | POA: Diagnosis not present

## 2021-03-17 DIAGNOSIS — I5043 Acute on chronic combined systolic (congestive) and diastolic (congestive) heart failure: Secondary | ICD-10-CM | POA: Diagnosis not present

## 2021-03-17 DIAGNOSIS — F32A Depression, unspecified: Secondary | ICD-10-CM | POA: Diagnosis not present

## 2021-03-17 DIAGNOSIS — I130 Hypertensive heart and chronic kidney disease with heart failure and stage 1 through stage 4 chronic kidney disease, or unspecified chronic kidney disease: Secondary | ICD-10-CM | POA: Diagnosis not present

## 2021-03-17 DIAGNOSIS — H548 Legal blindness, as defined in USA: Secondary | ICD-10-CM | POA: Diagnosis not present

## 2021-03-17 DIAGNOSIS — E1122 Type 2 diabetes mellitus with diabetic chronic kidney disease: Secondary | ICD-10-CM | POA: Diagnosis not present

## 2021-03-17 DIAGNOSIS — I132 Hypertensive heart and chronic kidney disease with heart failure and with stage 5 chronic kidney disease, or end stage renal disease: Secondary | ICD-10-CM | POA: Diagnosis not present

## 2021-03-17 DIAGNOSIS — I5042 Chronic combined systolic (congestive) and diastolic (congestive) heart failure: Secondary | ICD-10-CM | POA: Diagnosis not present

## 2021-03-17 DIAGNOSIS — E785 Hyperlipidemia, unspecified: Secondary | ICD-10-CM | POA: Diagnosis not present

## 2021-03-17 DIAGNOSIS — F419 Anxiety disorder, unspecified: Secondary | ICD-10-CM | POA: Diagnosis not present

## 2021-03-17 DIAGNOSIS — J449 Chronic obstructive pulmonary disease, unspecified: Secondary | ICD-10-CM | POA: Diagnosis not present

## 2021-03-17 DIAGNOSIS — G4733 Obstructive sleep apnea (adult) (pediatric): Secondary | ICD-10-CM | POA: Diagnosis not present

## 2021-03-17 NOTE — Progress Notes (Signed)
Bgl: 136 Today had a home visit with Enid Derry.  She denies any problems today.  She states she is feeling better, getting her strength back.  She is getting everything in order.  She gets transportation scheduled for going shopping and visiting family.  She has been going to all her dialysis appts and doctor appts.  She has all her medications and aware of how to take them and able to call for refills.  She fills a med box up weekly.  She has been doing some of her grocery shopping, cooking at home.  She is watching her high sodium foods and vary careful with how much fluids she intakes.  She checks her sugars often.  Her blood pressure is better today than last visit, she states she has started taking her blood pressure meds earlier in the day.  She has everything she needs for daily living.  She is aware of calling with any problems.  She states she sees now she is able to do more than she could, she still has PT coming but she says she does not need and aid daily now.  She knows on dialysis days to take it easy and she will feel more tired.  She has a friend coming to visit for 2 weeks, she is excited about that. Legs has no edema, lungs are clear.  Denies any chest pain or increased shortness of breath.  Denies dizziness.  Will continue to visit for heart failure.   [place] [phone]

## 2021-03-18 DIAGNOSIS — Z992 Dependence on renal dialysis: Secondary | ICD-10-CM | POA: Diagnosis not present

## 2021-03-18 DIAGNOSIS — N186 End stage renal disease: Secondary | ICD-10-CM | POA: Diagnosis not present

## 2021-03-19 DIAGNOSIS — I5043 Acute on chronic combined systolic (congestive) and diastolic (congestive) heart failure: Secondary | ICD-10-CM | POA: Diagnosis not present

## 2021-03-19 DIAGNOSIS — D631 Anemia in chronic kidney disease: Secondary | ICD-10-CM | POA: Diagnosis not present

## 2021-03-19 DIAGNOSIS — Z992 Dependence on renal dialysis: Secondary | ICD-10-CM | POA: Diagnosis not present

## 2021-03-19 DIAGNOSIS — F32A Depression, unspecified: Secondary | ICD-10-CM | POA: Diagnosis not present

## 2021-03-19 DIAGNOSIS — I2510 Atherosclerotic heart disease of native coronary artery without angina pectoris: Secondary | ICD-10-CM | POA: Diagnosis not present

## 2021-03-19 DIAGNOSIS — J449 Chronic obstructive pulmonary disease, unspecified: Secondary | ICD-10-CM | POA: Diagnosis not present

## 2021-03-19 DIAGNOSIS — F419 Anxiety disorder, unspecified: Secondary | ICD-10-CM | POA: Diagnosis not present

## 2021-03-19 DIAGNOSIS — I132 Hypertensive heart and chronic kidney disease with heart failure and with stage 5 chronic kidney disease, or end stage renal disease: Secondary | ICD-10-CM | POA: Diagnosis not present

## 2021-03-19 DIAGNOSIS — N186 End stage renal disease: Secondary | ICD-10-CM | POA: Diagnosis not present

## 2021-03-19 DIAGNOSIS — E1122 Type 2 diabetes mellitus with diabetic chronic kidney disease: Secondary | ICD-10-CM | POA: Diagnosis not present

## 2021-03-20 ENCOUNTER — Other Ambulatory Visit: Payer: Self-pay

## 2021-03-20 ENCOUNTER — Encounter: Payer: Self-pay | Admitting: Family Medicine

## 2021-03-20 ENCOUNTER — Observation Stay
Admission: EM | Admit: 2021-03-20 | Discharge: 2021-03-22 | Disposition: A | Discharge status: Home / Self Care | Payer: Medicare HMO | Attending: Internal Medicine | Admitting: Internal Medicine

## 2021-03-20 DIAGNOSIS — I5043 Acute on chronic combined systolic (congestive) and diastolic (congestive) heart failure: Secondary | ICD-10-CM | POA: Insufficient documentation

## 2021-03-20 DIAGNOSIS — J439 Emphysema, unspecified: Secondary | ICD-10-CM | POA: Diagnosis not present

## 2021-03-20 DIAGNOSIS — R220 Localized swelling, mass and lump, head: Secondary | ICD-10-CM | POA: Diagnosis present

## 2021-03-20 DIAGNOSIS — Z951 Presence of aortocoronary bypass graft: Secondary | ICD-10-CM | POA: Insufficient documentation

## 2021-03-20 DIAGNOSIS — N186 End stage renal disease: Secondary | ICD-10-CM | POA: Insufficient documentation

## 2021-03-20 DIAGNOSIS — I5020 Unspecified systolic (congestive) heart failure: Secondary | ICD-10-CM | POA: Diagnosis not present

## 2021-03-20 DIAGNOSIS — I132 Hypertensive heart and chronic kidney disease with heart failure and with stage 5 chronic kidney disease, or end stage renal disease: Secondary | ICD-10-CM | POA: Diagnosis not present

## 2021-03-20 DIAGNOSIS — Z7982 Long term (current) use of aspirin: Secondary | ICD-10-CM | POA: Insufficient documentation

## 2021-03-20 DIAGNOSIS — E1122 Type 2 diabetes mellitus with diabetic chronic kidney disease: Secondary | ICD-10-CM | POA: Diagnosis not present

## 2021-03-20 DIAGNOSIS — E1169 Type 2 diabetes mellitus with other specified complication: Secondary | ICD-10-CM

## 2021-03-20 DIAGNOSIS — J449 Chronic obstructive pulmonary disease, unspecified: Secondary | ICD-10-CM | POA: Insufficient documentation

## 2021-03-20 DIAGNOSIS — Z8616 Personal history of COVID-19: Secondary | ICD-10-CM | POA: Diagnosis not present

## 2021-03-20 DIAGNOSIS — Z79899 Other long term (current) drug therapy: Secondary | ICD-10-CM | POA: Diagnosis not present

## 2021-03-20 DIAGNOSIS — R609 Edema, unspecified: Secondary | ICD-10-CM | POA: Diagnosis not present

## 2021-03-20 DIAGNOSIS — I2510 Atherosclerotic heart disease of native coronary artery without angina pectoris: Secondary | ICD-10-CM | POA: Diagnosis not present

## 2021-03-20 DIAGNOSIS — Z20822 Contact with and (suspected) exposure to covid-19: Secondary | ICD-10-CM | POA: Diagnosis not present

## 2021-03-20 DIAGNOSIS — Z794 Long term (current) use of insulin: Secondary | ICD-10-CM | POA: Diagnosis not present

## 2021-03-20 DIAGNOSIS — E119 Type 2 diabetes mellitus without complications: Secondary | ICD-10-CM | POA: Diagnosis not present

## 2021-03-20 DIAGNOSIS — Z87891 Personal history of nicotine dependence: Secondary | ICD-10-CM | POA: Diagnosis not present

## 2021-03-20 DIAGNOSIS — T783XXA Angioneurotic edema, initial encounter: Secondary | ICD-10-CM | POA: Diagnosis not present

## 2021-03-20 DIAGNOSIS — J45909 Unspecified asthma, uncomplicated: Secondary | ICD-10-CM | POA: Diagnosis not present

## 2021-03-20 DIAGNOSIS — I82409 Acute embolism and thrombosis of unspecified deep veins of unspecified lower extremity: Secondary | ICD-10-CM | POA: Diagnosis present

## 2021-03-20 DIAGNOSIS — Z992 Dependence on renal dialysis: Secondary | ICD-10-CM | POA: Diagnosis not present

## 2021-03-20 DIAGNOSIS — I10 Essential (primary) hypertension: Secondary | ICD-10-CM | POA: Diagnosis not present

## 2021-03-20 HISTORY — DX: Angioneurotic edema, initial encounter: T78.3XXA

## 2021-03-20 LAB — BASIC METABOLIC PANEL
Anion gap: 13 (ref 5–15)
BUN: 29 mg/dL — ABNORMAL HIGH (ref 6–20)
CO2: 23 mmol/L (ref 22–32)
Calcium: 8.4 mg/dL — ABNORMAL LOW (ref 8.9–10.3)
Chloride: 98 mmol/L (ref 98–111)
Creatinine, Ser: 5.23 mg/dL — ABNORMAL HIGH (ref 0.44–1.00)
GFR, Estimated: 9 mL/min — ABNORMAL LOW (ref >60)
Glucose, Bld: 469 mg/dL — ABNORMAL HIGH (ref 70–99)
Potassium: 4.2 mmol/L (ref 3.5–5.1)
Sodium: 134 mmol/L — ABNORMAL LOW (ref 135–145)

## 2021-03-20 LAB — GLUCOSE, CAPILLARY: Glucose-Capillary: 343 mg/dL — ABNORMAL HIGH (ref 70–99)

## 2021-03-20 LAB — CBC WITH DIFFERENTIAL/PLATELET
Abs Immature Granulocytes: 0.02 10*3/uL (ref 0.00–0.07)
Basophils Absolute: 0.1 10*3/uL (ref 0.0–0.1)
Basophils Relative: 1 %
Eosinophils Absolute: 0.3 10*3/uL (ref 0.0–0.5)
Eosinophils Relative: 5 %
HCT: 39.4 % (ref 36.0–46.0)
Hemoglobin: 12.6 g/dL (ref 12.0–15.0)
Immature Granulocytes: 0 %
Lymphocytes Relative: 22 %
Lymphs Abs: 1.5 10*3/uL (ref 0.7–4.0)
MCH: 27.1 pg (ref 26.0–34.0)
MCHC: 32 g/dL (ref 30.0–36.0)
MCV: 84.7 fL (ref 80.0–100.0)
Monocytes Absolute: 0.5 10*3/uL (ref 0.1–1.0)
Monocytes Relative: 8 %
Neutro Abs: 4.5 10*3/uL (ref 1.7–7.7)
Neutrophils Relative %: 64 %
Platelets: 289 10*3/uL (ref 150–400)
RBC: 4.65 MIL/uL (ref 3.87–5.11)
RDW: 14.6 % (ref 11.5–15.5)
WBC: 7 10*3/uL (ref 4.0–10.5)
nRBC: 0 % (ref 0.0–0.2)

## 2021-03-20 LAB — HEPATITIS B SURFACE ANTIGEN: Hepatitis B Surface Ag: NONREACTIVE

## 2021-03-20 LAB — RESP PANEL BY RT-PCR (FLU A&B, COVID) ARPGX2
Influenza A by PCR: NEGATIVE
Influenza B by PCR: NEGATIVE
SARS Coronavirus 2 by RT PCR: NEGATIVE

## 2021-03-20 MED ORDER — INSULIN ASPART PROT & ASPART (70-30 MIX) 100 UNIT/ML ~~LOC~~ SUSP: 10.0000 [IU] | Freq: Two times a day (BID) | SUBCUTANEOUS | Status: DC

## 2021-03-20 MED ORDER — TOPIRAMATE 25 MG PO TABS
25.0000 mg | ORAL_TABLET | Freq: Every day | ORAL | Status: DC
  Administered 2021-03-21 – 2021-03-22 (×2): 25 mg via ORAL
  Filled 2021-03-20 (×2): qty 1

## 2021-03-20 MED ORDER — ACETAMINOPHEN 325 MG PO TABS
650.0000 mg | ORAL_TABLET | Freq: Four times a day (QID) | ORAL | Status: DC | PRN
  Administered 2021-03-21: 650 mg via ORAL
  Filled 2021-03-20: qty 2

## 2021-03-20 MED ORDER — PENTAFLUOROPROP-TETRAFLUOROETH EX AERO
1.0000 "application " | INHALATION_SPRAY | CUTANEOUS | Status: DC | PRN
  Filled 2021-03-20: qty 30

## 2021-03-20 MED ORDER — PANTOPRAZOLE SODIUM 40 MG PO TBEC
40.0000 mg | DELAYED_RELEASE_TABLET | Freq: Every day | ORAL | Status: DC
  Administered 2021-03-21 – 2021-03-22 (×2): 40 mg via ORAL
  Filled 2021-03-20 (×2): qty 1

## 2021-03-20 MED ORDER — CHLORHEXIDINE GLUCONATE CLOTH 2 % EX PADS
6.0000 | MEDICATED_PAD | Freq: Every day | CUTANEOUS | Status: DC
  Filled 2021-03-20: qty 6

## 2021-03-20 MED ORDER — MIRTAZAPINE 15 MG PO TABS
30.0000 mg | ORAL_TABLET | Freq: Every day | ORAL | Status: DC
  Administered 2021-03-20 – 2021-03-21 (×2): 30 mg via ORAL
  Filled 2021-03-20 (×2): qty 2

## 2021-03-20 MED ORDER — VITAMIN B-12 1000 MCG PO TABS
1000.0000 ug | ORAL_TABLET | Freq: Every day | ORAL | Status: DC
  Administered 2021-03-21 – 2021-03-22 (×2): 1000 ug via ORAL
  Filled 2021-03-20 (×2): qty 1

## 2021-03-20 MED ORDER — PREDNISONE 20 MG PO TABS
40.0000 mg | ORAL_TABLET | Freq: Once | ORAL | Status: AC
  Administered 2021-03-20: 40 mg via ORAL
  Filled 2021-03-20: qty 2

## 2021-03-20 MED ORDER — POLYETHYLENE GLYCOL 3350 17 G PO PACK
17.0000 g | PACK | Freq: Every day | ORAL | Status: DC | PRN
Start: 2021-03-20 — End: 2021-03-22

## 2021-03-20 MED ORDER — ISOSORBIDE MONONITRATE ER 30 MG PO TB24
30.0000 mg | ORAL_TABLET | Freq: Every day | ORAL | Status: DC
  Administered 2021-03-21 – 2021-03-22 (×2): 30 mg via ORAL
  Filled 2021-03-20 (×2): qty 1

## 2021-03-20 MED ORDER — FUROSEMIDE 40 MG PO TABS
40.0000 mg | ORAL_TABLET | Freq: Every day | ORAL | Status: DC
  Administered 2021-03-21 – 2021-03-22 (×2): 40 mg via ORAL
  Filled 2021-03-20 (×2): qty 1

## 2021-03-20 MED ORDER — ONDANSETRON HCL 4 MG/2ML IJ SOLN
4.0000 mg | Freq: Four times a day (QID) | INTRAMUSCULAR | Status: DC | PRN
  Administered 2021-03-21: 4 mg via INTRAVENOUS
  Filled 2021-03-20: qty 2

## 2021-03-20 MED ORDER — ACETAMINOPHEN 650 MG RE SUPP: 650.0000 mg | Freq: Four times a day (QID) | RECTAL | Status: DC | PRN

## 2021-03-20 MED ORDER — INSULIN ASPART 100 UNIT/ML ~~LOC~~ SOLN: 0.0000 [IU] | Freq: Three times a day (TID) | SUBCUTANEOUS | Status: DC

## 2021-03-20 MED ORDER — LIDOCAINE-PRILOCAINE 2.5-2.5 % EX CREA
1.0000 "application " | TOPICAL_CREAM | CUTANEOUS | Status: DC | PRN
  Filled 2021-03-20: qty 5

## 2021-03-20 MED ORDER — TRAZODONE HCL 50 MG PO TABS: 50.0000 mg | ORAL_TABLET | Freq: Every evening | ORAL | Status: DC | PRN

## 2021-03-20 MED ORDER — HEPARIN SODIUM (PORCINE) 1000 UNIT/ML DIALYSIS
1000.0000 [IU] | INTRAMUSCULAR | Status: DC | PRN
  Administered 2021-03-20: 3600 [IU] via INTRAVENOUS_CENTRAL
  Filled 2021-03-20 (×2): qty 1

## 2021-03-20 MED ORDER — LIDOCAINE HCL (PF) 1 % IJ SOLN
5.0000 mL | INTRAMUSCULAR | Status: DC | PRN
  Filled 2021-03-20: qty 5

## 2021-03-20 MED ORDER — ENOXAPARIN SODIUM 30 MG/0.3ML ~~LOC~~ SOLN
30.0000 mg | SUBCUTANEOUS | Status: DC
  Administered 2021-03-20: 30 mg via SUBCUTANEOUS
  Filled 2021-03-20: qty 0.3

## 2021-03-20 MED ORDER — SODIUM CHLORIDE 0.9 % IV SOLN: 100.0000 mL | INTRAVENOUS | Status: DC | PRN

## 2021-03-20 MED ORDER — TORSEMIDE 20 MG PO TABS: 20.0000 mg | ORAL_TABLET | Freq: Every day | ORAL | Status: DC

## 2021-03-20 MED ORDER — AMLODIPINE BESYLATE 10 MG PO TABS
10.0000 mg | ORAL_TABLET | Freq: Every day | ORAL | Status: DC
  Administered 2021-03-21 – 2021-03-22 (×2): 10 mg via ORAL
  Filled 2021-03-20 (×2): qty 1

## 2021-03-20 MED ORDER — HEPARIN SODIUM (PORCINE) 1000 UNIT/ML IJ SOLN
2000.0000 [IU] | Freq: Once | INTRAMUSCULAR | Status: AC
  Administered 2021-03-20: 2000 [IU] via INTRAVENOUS
  Filled 2021-03-20: qty 2

## 2021-03-20 MED ORDER — ASPIRIN EC 81 MG PO TBEC
81.0000 mg | DELAYED_RELEASE_TABLET | Freq: Every day | ORAL | Status: DC
  Administered 2021-03-21 – 2021-03-22 (×2): 81 mg via ORAL
  Filled 2021-03-20 (×2): qty 1

## 2021-03-20 MED ORDER — ALTEPLASE 2 MG IJ SOLR
2.0000 mg | Freq: Once | INTRAMUSCULAR | Status: DC | PRN
  Filled 2021-03-20: qty 2

## 2021-03-20 MED ORDER — OXYCODONE HCL 5 MG PO TABS: 5.0000 mg | ORAL_TABLET | ORAL | Status: DC | PRN

## 2021-03-20 MED ORDER — SODIUM CHLORIDE 0.9 % IV SOLN
100.0000 mL | INTRAVENOUS | Status: DC | PRN
  Administered 2021-03-21: 100 mL via INTRAVENOUS

## 2021-03-20 MED ORDER — METHYLPREDNISOLONE SODIUM SUCC 125 MG IJ SOLR
80.0000 mg | INTRAMUSCULAR | Status: AC
  Administered 2021-03-20: 80 mg via INTRAVENOUS
  Filled 2021-03-20: qty 2

## 2021-03-20 MED ORDER — DIPHENHYDRAMINE HCL 25 MG PO CAPS
25.0000 mg | ORAL_CAPSULE | Freq: Four times a day (QID) | ORAL | Status: DC | PRN
  Administered 2021-03-20: 25 mg via ORAL

## 2021-03-20 MED ORDER — ONDANSETRON HCL 4 MG PO TABS: 4.0000 mg | ORAL_TABLET | Freq: Four times a day (QID) | ORAL | Status: DC | PRN

## 2021-03-20 MED ORDER — ATORVASTATIN CALCIUM 20 MG PO TABS
80.0000 mg | ORAL_TABLET | Freq: Every day | ORAL | Status: DC
  Administered 2021-03-21: 80 mg via ORAL
  Filled 2021-03-20: qty 4

## 2021-03-20 MED ORDER — INSULIN ASPART 100 UNIT/ML ~~LOC~~ SOLN
0.0000 [IU] | Freq: Every day | SUBCUTANEOUS | Status: DC
  Administered 2021-03-20: 4 [IU] via SUBCUTANEOUS
  Filled 2021-03-20: qty 1

## 2021-03-20 MED ORDER — MOMETASONE FURO-FORMOTEROL FUM 200-5 MCG/ACT IN AERO
2.0000 | INHALATION_SPRAY | Freq: Two times a day (BID) | RESPIRATORY_TRACT | Status: DC
  Administered 2021-03-21 – 2021-03-22 (×3): 2 via RESPIRATORY_TRACT
  Filled 2021-03-20: qty 8.8

## 2021-03-20 MED ORDER — CARVEDILOL 25 MG PO TABS
25.0000 mg | ORAL_TABLET | Freq: Two times a day (BID) | ORAL | Status: DC
  Administered 2021-03-21 – 2021-03-22 (×3): 25 mg via ORAL
  Filled 2021-03-20 (×3): qty 1

## 2021-03-20 NOTE — Plan of Care (Signed)

## 2021-03-20 NOTE — ED Triage Notes (Signed)
Pt BIB EMS for angioedema of the lips. Pt brought in from dialysis clinic and was unable to complete treatment due to swelling of lips.   Pt has hoarseness to voice as well.  Pt airway is intact in triage.

## 2021-03-20 NOTE — H&P (Signed)
[facility] History and Physical  RUARI DUGGAN EVO:[id number] DOB: [birth year] DOA: 03/20/2021  Referring physician: Dr. Jacqualine Code PCP: Kirk Ruths, MD   Chief Complaint: facial swelling  HPI: Terry Arroyo is a 60 y.o. female with history of ESRD currently on dialysis, type 2 diabetes, HFrEF, COPD, DVT, who presents with facial swelling.  Patient reports that over the last several days she has been having intermittent swelling of her face, eyes, lips, and tongue.  This has been coming and going, but when it was noted today at dialysis she was sent to the ED for an evaluation.  She reports that she has never had her throat feels swollen but her tongue became concerning a large yesterday and made it difficult to eat or drink food.  She currently does not have any problem breathing, eating, or drinking.  She reports that she was placed back on lisinopril for blood pressure issues sometime in January.  Upon further recollection she thought it was strange that she was placed on lisinopril as she had previously been taken off of it around the same time that she had similar episodes of facial swelling several years ago in [place].  She was unaware that there was a connection between ACE inhibitor's and facial swelling.  In the ED vital signs notable only for hypertension, labs are unremarkable.  Exam was notable for some questionable periorbital edema as well as obvious swelling of the upper lip.  She was given IV methylprednisolone, p.o. prednisone, and admitted for further monitoring.  Review of Systems:  Pertinent positives and negative per HPI, all others reviewed and negative  Past Medical History:  Diagnosis Date  . Anemia   . Angina at rest [facility])   . Angioedema 03/20/2021   Lisinopril  . Anterior cerebral aneurysm    approximately 5 mm; left MCA   . Anxiety   . Asthma   . Blind left eye   . COPD (chronic obstructive pulmonary disease) ([place])   . Coronary artery disease    . Depression   . Diabetes mellitus without complication ([place])   . ESRD (end stage renal disease) ([place])   . GERD (gastroesophageal reflux disease)   . HFrEF (heart failure with reduced ejection fraction) ([place])   . History of 2019 novel coronavirus disease (COVID-19) 03/2019   [place]  . History of DVT (deep vein thrombosis)   . Hx of CABG   . Hyperlipidemia   . Hypertension   . MI (myocardial infarction) ([place]) 2018  . Pituitary adenoma ([place])   . Sleep apnea    non-compliant with nocturnal PAP therapy  . Stroke [facility]) 2014   stated affected left side.   Past Surgical History:  Procedure Laterality Date  . AV FISTULA PLACEMENT Right 01/28/2021   Procedure: ARTERIOVENOUS (AV) FISTULA CREATION;  Surgeon: Katha Cabal, MD;  Location: [facility] ORS;  Service: Vascular;  Laterality: Right;  . BRAIN SURGERY  1986   pituitary tumor  . BREAST EXCISIONAL BIOPSY Left yrs ago   benign  . CESAREAN SECTION    . CORONARY ARTERY BYPASS GRAFT  2018   done in [place]  . DIALYSIS/PERMA CATHETER INSERTION N/A 11/07/2020   Procedure: DIALYSIS/PERMA CATHETER INSERTION;  Surgeon: Algernon Huxley, MD;  Location: [place] CV LAB;  Service: Cardiovascular;  Laterality: N/A;  . EYE SURGERY Right 2021   cataracts    Social History:  reports that she quit smoking about 4 years ago. She  has never used smokeless tobacco. She reports previous alcohol use. She reports previous drug use.  Allergies  Allergen Reactions  . Hydralazine Shortness Of Breath and Swelling    Body aches  . Lisinopril Swelling    Angioedema  . Shellfish Allergy Anaphylaxis    Shrimp/lobster  Shrimp/lobster    . Kiwi Extract Swelling  . Metformin Other (See Comments)    GI Intolerance  . Tape Itching    Use paper tape whenever possible    Family History  Problem Relation Age of Onset  . CAD Mother   . CAD Brother   . Breast cancer Sister 55     Prior to Admission medications   Medication  Sig Start Date End Date Taking? Authorizing Provider  ACCU-CHEK AVIVA PLUS test strip  01/14/21   [provider]  Accu-Chek Softclix Lancets lancets  12/09/20   [provider]  acetaminophen (TYLENOL) 500 MG tablet Take 500-1,000 mg by mouth every 6 (six) hours as needed for mild pain or moderate pain.    [provider]  amLODipine (NORVASC) 10 MG tablet Take 1 tablet (10 mg total) by mouth daily. 03/03/20   Lorella Nimrod, MD  ammonium lactate (LAC-HYDRIN) 12 % lotion Apply 1 application topically as needed for dry skin.    [provider]  aspirin 81 MG EC tablet Take 1 tablet (81 mg total) by mouth daily. 02/10/21   Max Sane, MD  atorvastatin (LIPITOR) 80 MG tablet Take 1 tablet (80 mg total) by mouth daily at 6 PM. 11/26/19   Lavina Hamman, MD  carvedilol (COREG) 25 MG tablet Take 25 mg by mouth 2 (two) times daily with a meal.     [provider]  fenofibrate (TRICOR) 145 MG tablet Take 145 mg by mouth daily.    [provider]  furosemide (LASIX) 40 MG tablet Take 40 mg by mouth. Patient not taking: Reported on 03/17/2021    [provider]  HYDROcodone-acetaminophen (NORCO) 5-325 MG tablet Take 1-2 tablets by mouth every 6 (six) hours as needed. 01/28/21   Schnier, [name] Lory, MD  insulin aspart protamine- aspart (NOVOLOG MIX 70/30) (70-30) 100 UNIT/ML injection Inject 0.15 mLs (15 Units total) into the skin 2 (two) times daily with a meal. Patient taking differently: Inject 15 Units into the skin 3 (three) times daily with meals. 11/12/20   Fritzi Mandes, MD  isosorbide mononitrate (IMDUR) 30 MG 24 hr tablet Take 30 mg by mouth daily.    [provider]  lisinopril (ZESTRIL) 30 MG tablet Take 30 mg by mouth daily. 01/09/21   [provider]  mirtazapine (REMERON) 30 MG tablet Take 1 tablet (30 mg total) by mouth at bedtime. 11/26/19   Lavina Hamman, MD  mometasone-formoterol [facility]) 200-5 MCG/ACT AERO Inhale 2  puffs into the lungs 2 (two) times daily.    [provider]  omeprazole (PRILOSEC) 20 MG capsule Take 20 mg by mouth daily.  09/22/20   [provider]  polyethylene glycol (MIRALAX / GLYCOLAX) 17 g packet Take 17 g by mouth daily. 02/11/21   Lorella Nimrod, MD  topiramate (TOPAMAX) 25 MG tablet Take 25 mg by mouth daily. 08/19/20   [provider]  torsemide (DEMADEX) 10 MG tablet Take 2 tablets (20 mg total) by mouth daily. 10/12/20   Lorella Nimrod, MD  vitamin B-12 (CYANOCOBALAMIN) 1000 MCG tablet Take 1,000 mcg by mouth daily.    [provider]   Physical Exam: Vitals:  03/20/21 1308 03/20/21 1400 03/20/21 1556 03/20/21 1644  BP: 129/69 (!) 148/77 (!) 151/92 (!) 163/90  Pulse: 61 64 66 68  Resp: 20 15 18 17   Temp:    98.2 F (36.8 C)  TempSrc:      SpO2: 98% 98% 97% 99%  Weight:      Height:        Wt Readings from Last 3 Encounters:  03/20/21 78.5 kg  03/10/21 76.2 kg  02/10/21 72.6 kg     . General:  Appears calm and comfortable . Eyes: questionable mild periorbital swelling . ENT: noticeable swelling along upper lip . Neck: no masses . Cardiovascular: RRR, no m/r/g. No LE edema. Marland Kitchen Respiratory: CTA bilaterally, no w/r/r. Normal respiratory effort. . Abdomen: soft, ntnd . Skin: no rash or induration seen on limited exam . Musculoskeletal: grossly normal tone BUE/BLE . Psychiatric: grossly normal mood and affect, speech fluent and appropriate . Neurologic: grossly non-focal.          Labs on Admission:  Basic Metabolic Panel: Recent Labs  Lab 03/20/21 1247  NA 134*  K 4.2  CL 98  CO2 23  GLUCOSE 469*  BUN 29*  CREATININE 5.23*  CALCIUM 8.4*   Liver Function Tests: No results for input(s): AST, ALT, ALKPHOS, BILITOT, PROT, ALBUMIN in the last 168 hours. No results for input(s): LIPASE, AMYLASE in the last 168 hours. No results for input(s): AMMONIA in the last 168 hours. CBC: Recent Labs  Lab 03/20/21 1247  WBC 7.0   NEUTROABS 4.5  HGB 12.6  HCT 39.4  MCV 84.7  PLT 289   Cardiac Enzymes: No results for input(s): CKTOTAL, CKMB, CKMBINDEX, TROPONINI in the last 168 hours.  BNP (last 3 results) Recent Labs    10/10/20 1134 10/21/20 1931 11/01/20 1520  BNP 1,251.0* 1,384.4* 1,306.3*    ProBNP (last 3 results) No results for input(s): PROBNP in the last 8760 hours.  CBG: No results for input(s): GLUCAP in the last 168 hours.  Radiological Exams on Admission: No results found.  EKG: not obtained  Assessment/Plan Active Problems:   DM2 (diabetes mellitus, type 2) (HCC)   HFrEF (heart failure with reduced ejection fraction) (HCC)   ESRD (end stage renal disease) ([place])   DVT (deep venous thrombosis) (HCC)   COPD (chronic obstructive pulmonary disease) with emphysema ([place])   #Angioedema Patient presenting with classic signs of angioedema almost certainly secondary to recent reinitiation of an ACE inhibitor for which she reports a prior reaction of angioedema.  Possible that other medications such as amlodipine are implicated but this seems less likely given her history.  Lisinopril has been placed on her allergy list and discontinued.  We will monitor overnight for improvement and likely discharge in the a.m.  #Known medical problems ESRD-status post dialysis session between transferring from ED to the floor. Hypertension-continue amlodipine, carvedilol, Imdur, torsemide CAD-continue aspirin Hyperlipidemia-continue atorvastatin.  Fenofibrate not continued given ESRD. DM2-reduce NovoLog 70/30 mix from 15 units 3 times daily to 10 units twice daily with sensitive insulin sliding scale Insomnia-continue mirtazapine COPD-continue Dulera inhaler GERD-continue omeprazole  Code Status: Full code DVT Prophylaxis: Lovenox Family Communication: None Disposition Plan: Observation, MedSurg  Time spent: 50 min  Clarnce Flock MD/MPH [facility]  Note:  This document was prepared  using Systems analyst and may include unintentional dictation errors.

## 2021-03-20 NOTE — ED Notes (Signed)
continuous cardiac and pulse ox monitoring.  

## 2021-03-20 NOTE — ED Provider Notes (Signed)
[facility] Emergency Department Provider Note ____________________________________________   Event Date/Time   First MD Initiated Contact with Patient 03/20/21 1206     (approximate)  I have reviewed the triage vital signs and the nursing notes.  HISTORY  Chief Complaint Allergic Reaction  HPI Terry Arroyo is a 60 y.o. female with a history of end-stage renal disease on hemodialysis hypertension hyperlipidemia previous MI  Patient reports for the last couple days she has had intermittent swelling of her lips mouth and neck  She went to dialysis today and she was very swollen she reports when she got up this morning her lips were very swollen her eyes were almost swollen shut and she was having difficulty breathing because her tongue felt very large.  As she reports she had something similar when she was on hydralazine in the past.  The upper lip does itch slightly.  Denies any known insect bites or allergen triggers  She does report she is starting to feel better and the swelling seems a little bit better now but still persisting across her lip that is itching though her tongue does not feel swollen her eyes swelling has come down some now  EMS gave 50 mg oral Benadryl.  Patient did not complete her hemodialysis today   Past Medical History:  Diagnosis Date  . Anemia   . Angina at rest [facility])   . Anterior cerebral aneurysm    approximately 5 mm; left MCA   . Anxiety   . Asthma   . Blind left eye   . COPD (chronic obstructive pulmonary disease) ([place])   . Coronary artery disease   . Depression   . Diabetes mellitus without complication ([place])   . ESRD (end stage renal disease) ([place])   . GERD (gastroesophageal reflux disease)   . HFrEF (heart failure with reduced ejection fraction) ([place])   . History of 2019 novel coronavirus disease (COVID-19) 03/2019   [place]  . History of DVT (deep vein thrombosis)   . Hx of CABG    . Hyperlipidemia   . Hypertension   . MI (myocardial infarction) ([place]) 2018  . Pituitary adenoma ([place])   . Sleep apnea    non-compliant with nocturnal PAP therapy  . Stroke [facility]) 2014   stated affected left side.    Patient Active Problem List   Diagnosis Date Noted  . Acute pulmonary edema (HCC)   . COPD (chronic obstructive pulmonary disease) with emphysema ([place]) 02/09/2021  . Chronic sphenoidal sinusitis 01/01/2021  . Contact dermatitis and other eczema due to other specified agent 01/01/2021  . Hydronephrosis, left 01/01/2021  . Other diseases of nasal cavity and sinuses(478.19) 01/01/2021  . ESRD (end stage renal disease) ([place])   . Reactive thrombocytosis 11/08/2020  . Nephrotic syndrome 11/04/2020  . SOB (shortness of breath) 11/01/2020  . Acute on chronic combined systolic and diastolic CHF (congestive heart failure) ([place]) 10/21/2020  . CAD (coronary artery disease) 10/21/2020  . Hyperlipidemia associated with type 2 diabetes mellitus ([place]) 10/21/2020  . HFrEF (heart failure with reduced ejection fraction) ([place])   . Acute respiratory distress   . COPD exacerbation ([place]) 10/10/2020  . Kidney hematoma 09/13/2020  . Hypertension associated with diabetes ([place])   . Acute renal failure superimposed on stage 4 chronic kidney disease ([place])   . Hematuria 09/12/2020  . 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[place]) 09/03/2020  . Blind left eye 09/03/2020  . Brain aneurysm  09/03/2020  . Esotropia 09/03/2020  . Lesion of ulnar nerve 09/03/2020  . Mixed hyperlipidemia 09/03/2020  . Sensory hearing loss, bilateral 09/03/2020  . Tear film insufficiency 09/03/2020  . Health maintenance examination 06/27/2020  . Secondary hyperparathyroidism of renal origin ([place]) 04/24/2020  . Anemia in chronic kidney disease 03/10/2020  . Benign hypertensive kidney disease with chronic kidney disease 03/10/2020  . Hyposmolality and/or hyponatremia 03/10/2020  . Stage 3b  chronic kidney disease ([place]) 03/10/2020  . Calf tenderness   . Acute on chronic heart failure ([place]) 02/27/2020  . Leg edema   . DM2 (diabetes mellitus, type 2) ([place]) 02/26/2020  . CKD (chronic kidney disease) stage 4, GFR 15-29 ml/min (HCC) 02/26/2020  . Hyponatremia 02/26/2020  . Anasarca 02/26/2020  . Proteinuria 02/26/2020  . Hypoalbuminemia 02/26/2020  . Elevated troponin 02/26/2020  . Acute on chronic systolic CHF (congestive heart failure) ([place]) 02/26/2020  . Aortic atherosclerosis ([place]) 02/18/2020  . Hyperglycemia   . Nausea   . Hypertensive emergency   . Hypertensive urgency 11/24/2019  . Chest pain 11/23/2019  . OSA (obstructive sleep apnea) 04/10/2019  . Mild episode of recurrent major depressive disorder ([place]) 04/10/2019  . ACS (acute coronary syndrome) ([place]) 03/24/2019  . Pneumonia due to COVID-19 virus 03/24/2019  . Hypertensive heart/kidney disease w/chronic kidney disease stage III ([place]) 10/21/2017  . Mild nonproliferative diabetic retinopathy of right eye associated with type 2 diabetes mellitus ([place]) 09/09/2017  . Genital herpes 05/07/2017  . DVT (deep venous thrombosis) ([place]) 12/30/2016  . PVD (posterior vitreous detachment), right eye 01/09/2016  . Diabetic peripheral angiopathy ([place]) 01/03/2016  . Age-related nuclear cataract of both eyes 10/24/2015  . Chronic nonintractable headache 10/02/2015  . Dry eyes, bilateral 02/13/2015  . Proptosis 12/18/2014  . Iritis of right eye 12/03/2014  . Anemia 08/30/2014  . Depression 08/30/2014    Past Surgical History:  Procedure Laterality Date  . AV FISTULA PLACEMENT Right 01/28/2021   Procedure: ARTERIOVENOUS (AV) FISTULA CREATION;  Surgeon: Katha Cabal, MD;  Location: [facility] ORS;  Service: Vascular;  Laterality: Right;  . BRAIN SURGERY  1986   pituitary tumor  . BREAST EXCISIONAL BIOPSY Left yrs ago   benign  . CESAREAN SECTION    . CORONARY ARTERY BYPASS GRAFT  2018   done in [place]  . DIALYSIS/PERMA  CATHETER INSERTION N/A 11/07/2020   Procedure: DIALYSIS/PERMA CATHETER INSERTION;  Surgeon: Algernon Huxley, MD;  Location: [place] CV LAB;  Service: Cardiovascular;  Laterality: N/A;  . EYE SURGERY Right 2021   cataracts     Prior to Admission medications   Medication Sig Start Date End Date Taking? Authorizing Provider  ACCU-CHEK AVIVA PLUS test strip  01/14/21   [provider]  Accu-Chek Softclix Lancets lancets  12/09/20   [provider]  acetaminophen (TYLENOL) 500 MG tablet Take 500-1,000 mg by mouth every 6 (six) hours as needed for mild pain or moderate pain.    [provider]  amLODipine (NORVASC) 10 MG tablet Take 1 tablet (10 mg total) by mouth daily. 03/03/20   Lorella Nimrod, MD  ammonium lactate (LAC-HYDRIN) 12 % lotion Apply 1 application topically as needed for dry skin.    [provider]  aspirin 81 MG EC tablet Take 1 tablet (81 mg total) by mouth daily. 02/10/21   Max Sane, MD  atorvastatin (LIPITOR) 80 MG tablet Take 1 tablet (80 mg total) by mouth daily at 6 PM. 11/26/19   Lavina Hamman, MD  carvedilol (COREG) 25 MG tablet Take 25 mg by mouth 2 (two) times daily with a meal.     [provider]  fenofibrate (TRICOR) 145 MG tablet Take 145 mg by mouth daily.    [provider]  furosemide (LASIX) 40 MG tablet Take 40 mg by mouth. Patient not taking: Reported on 03/17/2021    [provider]  HYDROcodone-acetaminophen (NORCO) 5-325 MG tablet Take 1-2 tablets by mouth every 6 (six) hours as needed. 01/28/21   Schnier, [name] Lory, MD  insulin aspart protamine- aspart (NOVOLOG MIX 70/30) (70-30) 100 UNIT/ML injection Inject 0.15 mLs (15 Units total) into the skin 2 (two) times daily with a meal. Patient taking differently: Inject 15 Units into the skin 3 (three) times daily with meals. 11/12/20   Fritzi Mandes, MD  isosorbide mononitrate (IMDUR) 30 MG 24 hr tablet Take 30 mg by mouth daily.    [provider]  lisinopril (ZESTRIL) 30 MG tablet Take 30 mg by mouth daily. 01/09/21   [provider]  mirtazapine (REMERON) 30 MG tablet Take 1 tablet (30 mg total) by mouth at bedtime. 11/26/19   Lavina Hamman, MD  mometasone-formoterol [facility]) 200-5 MCG/ACT AERO Inhale 2 puffs into the lungs 2 (two) times daily.    [provider]  omeprazole (PRILOSEC) 20 MG capsule Take 20 mg by mouth daily.  09/22/20   [provider]  polyethylene glycol (MIRALAX / GLYCOLAX) 17 g packet Take 17 g by mouth daily. 02/11/21   Lorella Nimrod, MD  topiramate (TOPAMAX) 25 MG tablet Take 25 mg by mouth daily. 08/19/20   [provider]  torsemide (DEMADEX) 10 MG tablet Take 2 tablets (20 mg total) by mouth daily. 10/12/20   Lorella Nimrod, MD  vitamin B-12 (CYANOCOBALAMIN) 1000 MCG tablet Take 1,000 mcg by mouth daily.    [provider]    Allergies Hydralazine, Shellfish allergy, Kiwi extract, Metformin, and Tape  Family History  Problem Relation Age of Onset  . CAD Mother   . CAD Brother   . Breast cancer Sister 69    Social History Social History   Tobacco Use  . Smoking status: Former Smoker    Quit date: 2018    Years since quitting: 4.2  . Smokeless tobacco: Never Used  Vaping Use  . Vaping Use: Never used  Substance Use Topics  . Alcohol use: Not Currently  . Drug use: Not Currently    Review of Systems Constitutional: No fever/chills Eyes: No visual changes sent for swelling around her eyes. ENT: No sore throat.  See HPI Cardiovascular: Denies chest pain. Respiratory: Denies shortness of breath. Gastrointestinal: No abdominal pain.   Genitourinary: Negative for dysuria. Musculoskeletal: Negative for back pain. Skin: Negative for rash. Neurological: Negative for headaches, areas of focal weakness or numbness.    ____________________________________________   PHYSICAL EXAM:  VITAL SIGNS: ED Triage Vitals  Enc Vitals Group     BP 03/20/21  1217 (!) 159/135     Pulse Rate 03/20/21 1218 64     Resp 03/20/21 1224 14     Temp 03/20/21 1224 97.7 F (36.5 C)     Temp Source 03/20/21 1224 Oral     SpO2 03/20/21 1218 96 %     Weight 03/20/21 1217 173 lb (78.5 kg)     Height 03/20/21 1217 5\' 6"  (1.676 m)     Head Circumference --      Peak Flow --      Pain  Score 03/20/21 1217 0     Pain Loc --      Pain Edu? --      Excl. in [place]? --     Constitutional: Alert and oriented. Well appearing and in no acute distress. Eyes: Conjunctivae are normal. Head: Atraumatic.  Patient has mild periorbital edema bilateral. Nose: No congestion/rhinnorhea. Mouth/Throat: Mucous membranes are moist. Neck: No stridor.  Patient has mild to moderate angioedema of her upper lip, lower lip does not appear to be involved.  Currently demonstrating no lingular or sublingual edema.  Posterior oropharynx is visualizable and does not appear to be edematous at this point.  She reports just a slight hoarseness of voice Cardiovascular: Normal rate, regular rhythm. Grossly normal heart sounds.  Good peripheral circulation. Respiratory: Normal respiratory effort.  No retractions. Lungs CTAB. Gastrointestinal: Soft and nontender. No distention. Musculoskeletal: No lower extremity tenderness nor edema. Neurologic:  Normal speech and language. No gross focal neurologic deficits are appreciated.  Skin:  Skin is warm, dry and intact. No rash noted. Psychiatric: Mood and affect are normal. Speech and behavior are normal.  ____________________________________________   LABS (all labs ordered are listed, but only abnormal results are displayed)  Labs Reviewed  BASIC METABOLIC PANEL - Abnormal; Notable for the following components:      Result Value   Sodium 134 (*)    Glucose, Bld 469 (*)    BUN 29 (*)    Creatinine, Ser 5.23 (*)    Calcium 8.4 (*)    GFR, Estimated 9 (*)    All other components within normal limits  RESP PANEL BY RT-PCR (FLU A&B, COVID)  ARPGX2  CBC WITH DIFFERENTIAL/PLATELET   ____________________________________________  EKG   ____________________________________________  RADIOLOGY   ____________________________________________   PROCEDURES  Procedure(s) performed: None  Procedures  Critical Care performed: No  ____________________________________________   INITIAL IMPRESSION / ASSESSMENT AND PLAN / ED COURSE  Pertinent labs & imaging results that were available during my care of the patient were reviewed by me and considered in my medical decision making (see chart for details).   Patient presents evaluated, demonstrates what appears to be angioedema.  No obvious allergen type trigger.  She does report recent use of Prilosec added to her medications, but also reports being on lisinopril, as well as Norvasc and a host of medications that could potentially lead to angioedema.  Will give steroid here, doubt that there would be benefit to epinephrine at this point, denies any respiratory symptom except for slight hoarseness of voice now,  ----------------------------------------- 2:51 PM on 03/20/2021 -----------------------------------------  Patient reports no trouble swallowing.  Reports her voice seems to have returned to normal.  Will admit for further care management as she continues to exhibit mild angioedema of her upper lip and some periorbital.  Given her waxing and waning course the last several days and being on ACE inhibitor, I certainly have concern that this could acutely worsen and at this point feel it would be prudent to observe her given the report of airway involvement but seems to be improving    Dr. Juleen China has seen and evaluated the patient planning to perform hemodialysis this afternoon.  Admission discussed with hospitalist Dr. Dione Plover.  Patient understanding agreeable with admission  ____________________________________________   FINAL CLINICAL IMPRESSION(S) / ED  DIAGNOSES  Final diagnoses:  Angioedema, initial encounter        Note:  This document was prepared using Dragon voice recognition software and may include unintentional dictation errors  Delman Kitten, MD 03/20/21 1453

## 2021-03-20 NOTE — Progress Notes (Signed)
[facility]  Dialysis Note   Subjective:   Terry Arroyo is a 60 y.o. female with established care at [facility]. She has past medical history of ESRD on HD, hypertension, sleep apnea, CABG, depression, diabetes, CAD and COPD. Her dialysis clinic sent her to the ED with facial swelling that started last night.   Patient seen resting in bed Alert and oriented Currently NPO Denies shortness of breath States her mouth and tongue feel swollen. She placed ice on lip last night but awoke with lip and right facial swelling. She put on her mask and went to treatment.   Objective:  Vital signs in last 24 hours:  Temp:  [97.7 F (36.5 C)] 97.7 F (36.5 C) (04/01 1224) Pulse Rate:  [61-64] 61 (04/01 1308) Resp:  [14-20] 20 (04/01 1308) BP: (129-159)/(69-135) 129/69 (04/01 1308) SpO2:  [96 %-98 %] 98 % (04/01 1308) Weight:  [78.5 kg] 78.5 kg (04/01 1217)  Weight change:  Filed Weights   03/20/21 1217  Weight: 78.5 kg    Intake/Output: No intake/output data recorded.   Intake/Output this shift:  No intake/output data recorded.  Physical Exam: General: NAD resting comfortable  Head: Normocephalic, atraumatic. Min Tongue edema,  Upper lip, right jaw edema  Eyes: Anicteric, PERRL  Neck: Supple, trachea midline  Lungs:  Clear to auscultation  Heart: Regular rate and rhythm  Abdomen:  Soft, nontender,   Extremities:  no peripheral edema.  Neurologic: Nonfocal, moving all four extremities  Skin: No lesions  Access: Rt IJ Permcath    Basic Metabolic Panel: Recent Labs  Lab 03/20/21 1247  NA 134*  K 4.2  CL 98  CO2 23  GLUCOSE 469*  BUN 29*  CREATININE 5.23*  CALCIUM 8.4*    Liver Function Tests: No results for input(s): AST, ALT, ALKPHOS, BILITOT, PROT, ALBUMIN in the last 168 hours. No results for input(s): LIPASE, AMYLASE in the last 168 hours. No results for input(s): AMMONIA in the last 168 hours.  CBC: Recent Labs  Lab 03/20/21 1247  WBC 7.0   NEUTROABS 4.5  HGB 12.6  HCT 39.4  MCV 84.7  PLT 289    Cardiac Enzymes: No results for input(s): CKTOTAL, CKMB, CKMBINDEX, TROPONINI in the last 168 hours.  BNP: Invalid input(s): POCBNP  CBG: No results for input(s): GLUCAP in the last 168 hours.  Microbiology: Results for orders placed or performed during the hospital encounter of 02/09/21  Resp Panel by RT-PCR (Flu A&B, Covid) Nasopharyngeal Swab     Status: None   Collection Time: 02/09/21  4:06 PM   Specimen: Nasopharyngeal Swab; Nasopharyngeal(NP) swabs in vial transport medium  Result Value Ref Range Status   SARS Coronavirus 2 by RT PCR NEGATIVE NEGATIVE Final    Comment: (NOTE) SARS-CoV-2 target nucleic acids are NOT DETECTED.  The SARS-CoV-2 RNA is generally detectable in upper respiratory specimens during the acute phase of infection. The lowest concentration of SARS-CoV-2 viral copies this assay can detect is 138 copies/mL. A negative result does not preclude SARS-Cov-2 infection and should not be used as the sole basis for treatment or other patient management decisions. A negative result may occur with  improper specimen collection/handling, submission of specimen other than nasopharyngeal swab, presence of viral mutation(s) within the areas targeted by this assay, and inadequate number of viral copies(<138 copies/mL). A negative result must be combined with clinical observations, patient history, and epidemiological information. The expected result is Negative.  Fact Sheet for Patients:  EntrepreneurPulse.com.au  Fact Sheet for  Healthcare Providers:  IncredibleEmployment.be  This test is no t yet approved or cleared by the [place] and  has been authorized for detection and/or diagnosis of SARS-CoV-2 by FDA under an Emergency Use Authorization (EUA). This EUA will remain  in effect (meaning this test can be used) for the duration of the COVID-19 declaration  under Section 564(b)(1) of the Act, 21 U.S.C.section 360bbb-3(b)(1), unless the authorization is terminated  or revoked sooner.       Influenza A by PCR NEGATIVE NEGATIVE Final   Influenza B by PCR NEGATIVE NEGATIVE Final    Comment: (NOTE) The Xpert Xpress SARS-CoV-2/FLU/RSV plus assay is intended as an aid in the diagnosis of influenza from Nasopharyngeal swab specimens and should not be used as a sole basis for treatment. Nasal washings and aspirates are unacceptable for Xpert Xpress SARS-CoV-2/FLU/RSV testing.  Fact Sheet for Patients: EntrepreneurPulse.com.au  Fact Sheet for Healthcare Providers: IncredibleEmployment.be  This test is not yet approved or cleared by the [place] FDA and has been authorized for detection and/or diagnosis of SARS-CoV-2 by FDA under an Emergency Use Authorization (EUA). This EUA will remain in effect (meaning this test can be used) for the duration of the COVID-19 declaration under Section 564(b)(1) of the Act, 21 U.S.C. section 360bbb-3(b)(1), unless the authorization is terminated or revoked.  Performed at [facility], [address] [place], [place] [address]     Coagulation Studies: No results for input(s): LABPROT, INR in the last 72 hours.  Urinalysis: No results for input(s): COLORURINE, LABSPEC, PHURINE, GLUCOSEU, HGBUR, BILIRUBINUR, KETONESUR, PROTEINUR, UROBILINOGEN, NITRITE, LEUKOCYTESUR in the last 72 hours.  Invalid input(s): APPERANCEUR    Imaging: No results found.   Medications:       Assessment/ Plan:  [name] is a 60 y.o.  female with established care at [facility]. She has past medical history of ESRD on HD, hypertension, sleep apnea, CABG, depression, diabetes, CAD and COPD. Her dialysis clinic sent her to the ED with facial swelling that started last night.   [facility]/ MWF/ [facility]/ Rt IJ Permcath/ 74kg   1. End Stage Renal Disease on hemodialysis:  MWF  Last hemodialysis was 03/18/21 Will receive dialysis today  Next scheduled dialysis is Monday.    No results found for: POTASSIUM No intake or output data in the 24 hours ending 03/20/21 1448  2. Hypertension with chronic kidney disease:   Well controlled. Home regimen consists of carvedilol, amlodipine, isosorbide, and lisinopril.   BP 129/69   Pulse 61   Temp 97.7 F (36.5 C) (Oral)   Resp 20   Ht 5\' 6"  (1.676 m)   Wt 78.5 kg   SpO2 98%   BMI 27.92 kg/m   3. Anemia of chronic kidney disease  Lab Results  Component Value Date   HGB 12.6 03/20/2021  Within acceptable range  4. Secondary Hyperparathyroidism: with outpatient labs:  Lab Results  Component Value Date   CALCIUM 8.4 (L) 03/20/2021   CAION 1.13 (L) 01/28/2021   PHOS 3.4 11/12/2020  Within acceptable range        LOS: 0 Trestin Vences 4/1/20222:48 PM

## 2021-03-20 NOTE — Progress Notes (Signed)
1hr and 23 mins rtd,arterial and venous pressures were elevated despite flush. Clotting was noted in the arterial and venous chambers as well as the top of the dialyzer. Blood was then returned to prevent loss of circuit blood. Blood return was successful. Patient agreed to have a new set up and resume her tx. New circuit was set up and DR. Candiss Norse was called and made aware. Verbal order was also obtained to give a bolus of heprin to prevent clotting of the new circuit. Patient's vital signs remained stable.

## 2021-03-21 DIAGNOSIS — N186 End stage renal disease: Secondary | ICD-10-CM | POA: Diagnosis not present

## 2021-03-21 DIAGNOSIS — E1122 Type 2 diabetes mellitus with diabetic chronic kidney disease: Secondary | ICD-10-CM | POA: Diagnosis not present

## 2021-03-21 DIAGNOSIS — Z794 Long term (current) use of insulin: Secondary | ICD-10-CM | POA: Diagnosis not present

## 2021-03-21 DIAGNOSIS — D631 Anemia in chronic kidney disease: Secondary | ICD-10-CM | POA: Diagnosis not present

## 2021-03-21 DIAGNOSIS — T783XXA Angioneurotic edema, initial encounter: Secondary | ICD-10-CM | POA: Diagnosis not present

## 2021-03-21 DIAGNOSIS — Z992 Dependence on renal dialysis: Secondary | ICD-10-CM | POA: Diagnosis not present

## 2021-03-21 LAB — GLUCOSE, CAPILLARY
Glucose-Capillary: 534 mg/dL (ref 70–99)
Glucose-Capillary: 442 mg/dL — ABNORMAL HIGH (ref 70–99)
Glucose-Capillary: 505 mg/dL (ref 70–99)
Glucose-Capillary: 493 mg/dL — ABNORMAL HIGH (ref 70–99)
Glucose-Capillary: 491 mg/dL — ABNORMAL HIGH (ref 70–99)
Glucose-Capillary: 300 mg/dL — ABNORMAL HIGH (ref 70–99)
Glucose-Capillary: 282 mg/dL — ABNORMAL HIGH (ref 70–99)
Glucose-Capillary: 387 mg/dL — ABNORMAL HIGH (ref 70–99)
Glucose-Capillary: 235 mg/dL — ABNORMAL HIGH (ref 70–99)

## 2021-03-21 LAB — BASIC METABOLIC PANEL
Anion gap: 12 (ref 5–15)
BUN: 27 mg/dL — ABNORMAL HIGH (ref 6–20)
CO2: 24 mmol/L (ref 22–32)
Calcium: 8.5 mg/dL — ABNORMAL LOW (ref 8.9–10.3)
Chloride: 94 mmol/L — ABNORMAL LOW (ref 98–111)
Creatinine, Ser: 3.78 mg/dL — ABNORMAL HIGH (ref 0.44–1.00)
GFR, Estimated: 13 mL/min — ABNORMAL LOW (ref >60)
Glucose, Bld: 586 mg/dL (ref 70–99)
Potassium: 4.1 mmol/L (ref 3.5–5.1)
Sodium: 130 mmol/L — ABNORMAL LOW (ref 135–145)

## 2021-03-21 MED ORDER — INSULIN ASPART 100 UNIT/ML ~~LOC~~ SOLN: 15.0000 [IU] | Freq: Once | SUBCUTANEOUS | Status: DC

## 2021-03-21 MED ORDER — INSULIN ASPART 100 UNIT/ML ~~LOC~~ SOLN
0.0000 [IU] | SUBCUTANEOUS | Status: DC
  Administered 2021-03-21: 5 [IU] via SUBCUTANEOUS
  Administered 2021-03-21: 8 [IU] via SUBCUTANEOUS
  Administered 2021-03-21: 15 [IU] via SUBCUTANEOUS
  Administered 2021-03-22: 5 [IU] via SUBCUTANEOUS
  Administered 2021-03-22: 3 [IU] via SUBCUTANEOUS
  Administered 2021-03-22: 5 [IU] via SUBCUTANEOUS
  Administered 2021-03-22: 3 [IU] via SUBCUTANEOUS
  Filled 2021-03-21 (×6): qty 1

## 2021-03-21 MED ORDER — INSULIN ASPART 100 UNIT/ML ~~LOC~~ SOLN: 0.0000 [IU] | Freq: Three times a day (TID) | SUBCUTANEOUS | Status: DC

## 2021-03-21 MED ORDER — INSULIN ASPART 100 UNIT/ML ~~LOC~~ SOLN
3.0000 [IU] | Freq: Once | SUBCUTANEOUS | Status: AC
  Administered 2021-03-21: 3 [IU] via INTRAVENOUS
  Filled 2021-03-21: qty 0.03

## 2021-03-21 MED ORDER — INSULIN GLARGINE 100 UNIT/ML ~~LOC~~ SOLN
20.0000 [IU] | Freq: Every day | SUBCUTANEOUS | Status: DC
  Administered 2021-03-21 – 2021-03-22 (×2): 20 [IU] via SUBCUTANEOUS
  Filled 2021-03-21 (×3): qty 0.2

## 2021-03-21 MED ORDER — INSULIN ASPART 100 UNIT/ML IV SOLN
15.0000 [IU] | INTRAVENOUS | Status: AC
  Administered 2021-03-21: 15 [IU] via INTRAVENOUS
  Filled 2021-03-21: qty 0.15

## 2021-03-21 MED ORDER — HEPARIN SODIUM (PORCINE) 5000 UNIT/ML IJ SOLN
5000.0000 [IU] | Freq: Three times a day (TID) | INTRAMUSCULAR | Status: DC
  Administered 2021-03-21 – 2021-03-22 (×2): 5000 [IU] via SUBCUTANEOUS
  Filled 2021-03-21 (×2): qty 1

## 2021-03-21 NOTE — Progress Notes (Addendum)
Patient called this nurse at 0520 c/o of  nausea and vomiting. When nurse entered patients room, patient was vomiting and appeared to be sweaty and clammy. Patient vital  signs and blood sugar were taken. Patient was given zofran IV.   B/S was 534. On call provider Judd Gaudier MD was paged and orders for IV insulin 3 units was given at and administered. BS was rechecked and is now 505. Will continue to monitor.   0715: BS rechecked now 442.

## 2021-03-21 NOTE — Progress Notes (Addendum)
BG 493.  Decreased LOC.  Poor response to sternal rub.  Provider notified.  RR called.

## 2021-03-21 NOTE — Progress Notes (Addendum)
[facility]  Dialysis Note   Subjective:   Terry Arroyo is a 60 y.o. female with established care at [facility]. She has past medical history of ESRD on HD, hypertension, sleep apnea, CABG, depression, diabetes, CAD and COPD. Her dialysis clinic sent her to the ED with facial swelling that started last night.   Patient seen resting in bed Reports feeling "bad" overnight when her sugar went high Currently feels fine Denies shortness of breath and nausea  Objective:  Vital signs in last 24 hours:  Temp:  [97.7 F (36.5 C)-98.8 F (37.1 C)] 98.2 F (36.8 C) (04/02 0756) Pulse Rate:  [61-88] 84 (04/02 0756) Resp:  [14-20] 18 (04/02 0756) BP: (129-182)/(69-135) 174/93 (04/02 0756) SpO2:  [95 %-100 %] 99 % (04/02 0756) Weight:  [78.1 kg-78.5 kg] 78.1 kg (04/01 2257)  Weight change:  Filed Weights   03/20/21 1217 03/20/21 2257  Weight: 78.5 kg 78.1 kg    Intake/Output: I/O last 3 completed shifts: In: 100 [I.V.:100] Out: 1500 [Other:1500]   Intake/Output this shift:  No intake/output data recorded.  Physical Exam: General: NAD resting comfortable  Head: Normocephalic, atraumatic. Moist oral mucosa  Eyes: Anicteric, PERRL  Neck: Supple, trachea midline  Lungs:  Clear to auscultation  Heart: Regular rate and rhythm  Abdomen:  Soft, nontender,   Extremities:  no peripheral edema.  Neurologic: Nonfocal, moving all four extremities  Skin: No lesions  Access: Rt IJ Permcath    Basic Metabolic Panel: Recent Labs  Lab 03/20/21 1247 03/21/21 0614  NA 134* 130*  K 4.2 4.1  CL 98 94*  CO2 23 24  GLUCOSE 469* 586*  BUN 29* 27*  CREATININE 5.23* 3.78*  CALCIUM 8.4* 8.5*    Liver Function Tests: No results for input(s): AST, ALT, ALKPHOS, BILITOT, PROT, ALBUMIN in the last 168 hours. No results for input(s): LIPASE, AMYLASE in the last 168 hours. No results for input(s): AMMONIA in the last 168 hours.  CBC: Recent Labs  Lab 03/20/21 1247  WBC 7.0   NEUTROABS 4.5  HGB 12.6  HCT 39.4  MCV 84.7  PLT 289    Cardiac Enzymes: No results for input(s): CKTOTAL, CKMB, CKMBINDEX, TROPONINI in the last 168 hours.  BNP: Invalid input(s): POCBNP  CBG: Recent Labs  Lab 03/21/21 0647 03/21/21 0713 03/21/21 0743 03/21/21 0846 03/21/21 1004  GLUCAP 505* 442* 493* 491* 300*    Microbiology: Results for orders placed or performed during the hospital encounter of 03/20/21  Resp Panel by RT-PCR (Flu A&B, Covid) Nasopharyngeal Swab     Status: None   Collection Time: 03/20/21  3:25 PM   Specimen: Nasopharyngeal Swab; Nasopharyngeal(NP) swabs in vial transport medium  Result Value Ref Range Status   SARS Coronavirus 2 by RT PCR NEGATIVE NEGATIVE Final    Comment: (NOTE) SARS-CoV-2 target nucleic acids are NOT DETECTED.  The SARS-CoV-2 RNA is generally detectable in upper respiratory specimens during the acute phase of infection. The lowest concentration of SARS-CoV-2 viral copies this assay can detect is 138 copies/mL. A negative result does not preclude SARS-Cov-2 infection and should not be used as the sole basis for treatment or other patient management decisions. A negative result may occur with  improper specimen collection/handling, submission of specimen other than nasopharyngeal swab, presence of viral mutation(s) within the areas targeted by this assay, and inadequate number of viral copies(<138 copies/mL). A negative result must be combined with clinical observations, patient history, and epidemiological information. The expected result is Negative.  Fact  Sheet for Patients:  EntrepreneurPulse.com.au  Fact Sheet for Healthcare Providers:  IncredibleEmployment.be  This test is no t yet approved or cleared by the [place] FDA and  has been authorized for detection and/or diagnosis of SARS-CoV-2 by FDA under an Emergency Use Authorization (EUA). This EUA will remain  in effect  (meaning this test can be used) for the duration of the COVID-19 declaration under Section 564(b)(1) of the Act, 21 U.S.C.section 360bbb-3(b)(1), unless the authorization is terminated  or revoked sooner.       Influenza A by PCR NEGATIVE NEGATIVE Final   Influenza B by PCR NEGATIVE NEGATIVE Final    Comment: (NOTE) The Xpert Xpress SARS-CoV-2/FLU/RSV plus assay is intended as an aid in the diagnosis of influenza from Nasopharyngeal swab specimens and should not be used as a sole basis for treatment. Nasal washings and aspirates are unacceptable for Xpert Xpress SARS-CoV-2/FLU/RSV testing.  Fact Sheet for Patients: EntrepreneurPulse.com.au  Fact Sheet for Healthcare Providers: IncredibleEmployment.be  This test is not yet approved or cleared by the [place] FDA and has been authorized for detection and/or diagnosis of SARS-CoV-2 by FDA under an Emergency Use Authorization (EUA). This EUA will remain in effect (meaning this test can be used) for the duration of the COVID-19 declaration under Section 564(b)(1) of the Act, 21 U.S.C. section 360bbb-3(b)(1), unless the authorization is terminated or revoked.  Performed at [facility], [address] [place], [place] [address]     Coagulation Studies: No results for input(s): LABPROT, INR in the last 72 hours.  Urinalysis: No results for input(s): COLORURINE, LABSPEC, PHURINE, GLUCOSEU, HGBUR, BILIRUBINUR, KETONESUR, PROTEINUR, UROBILINOGEN, NITRITE, LEUKOCYTESUR in the last 72 hours.  Invalid input(s): APPERANCEUR    Imaging: No results found.   Medications:       Assessment/ Plan:  [name] is a 60 y.o.  female with established care at [facility]. She has past medical history of ESRD on HD, hypertension, sleep apnea, CABG, depression, diabetes, CAD and COPD. Her dialysis clinic sent her to the ED with facial swelling that started last night.   [facility]/ MWF/  [facility]/ Rt IJ Permcath/ 74kg   1. End Stage Renal Disease on hemodialysis: MWF  Last hemodialysis was 03/18/21 Received dialysis yesterday UF goal 1.5L acheived  Next scheduled dialysis is Monday.    No results found for: POTASSIUM  Intake/Output Summary (Last 24 hours) at 03/21/2021 1010 Last data filed at 03/21/2021 7782 Gross per 24 hour  Intake 100 ml  Output 1500 ml  Net -1400 ml    2. Hypertension with chronic kidney disease:   Well controlled.  Home regimen consists of carvedilol, amlodipine, isosorbide, and lisinopril.   BP (!) 174/93   Pulse 84   Temp 98.2 F (36.8 C) (Oral)   Resp 18   Ht 5\' 6"  (1.676 m)   Wt 78.1 kg   SpO2 99%   BMI 27.79 kg/m   3. Anemia of chronic kidney disease  Lab Results  Component Value Date   HGB 12.6 03/20/2021  Within acceptable range  4. Secondary Hyperparathyroidism: with outpatient labs:  Lab Results  Component Value Date   CALCIUM 8.5 (L) 03/21/2021   CAION 1.13 (L) 01/28/2021   PHOS 3.4 11/12/2020  Within acceptable range  5. Diabetes mellitus type II with chronic kidney disease: insulin dependent. Home regimen includes Aspart 15 units BID. Most recent hemoglobin A1c is 8.9 on 02/09/21.  - Given methylprednisolone  80mg  yesterday in the ED for angioedema -  Glucose 534 this morning - Continues to trend down since that time, 282 at 1200.     LOS: 0 Zakariah Urwin 4/2/202210:10 AM

## 2021-03-21 NOTE — Progress Notes (Signed)
Order Req for patient requesting Advance Directive. Patient was also a rapid response follow up from previous chaplain. I spoke to patient briefly about the AD packet and information, leaving packet with her to be completed and possibly notarized on Monday.

## 2021-03-21 NOTE — Significant Event (Signed)
Rapid Response Event Note   Reason for Call : called RRT for persistent high blood sugars, non dka.   Initial Focused Assessment: Pt laying in bed, arousable, but lethargic, c/o nausea at times.      Interventions: Spoke with Dr Jimmye Norman, made NPO, changed SSI with q 4 hour frequency checks, 15 units novalog IV once.   Plan of Care: as above, Mickle Mallory, and or Agricultural consultant, Estill Bamberg to call for further assistance.     Event Summary:   MD Notified: Dr Jimmye Norman [place] Call [place] A, RN

## 2021-03-21 NOTE — Progress Notes (Signed)
PROGRESS NOTE    Terry Arroyo  KNL:[id number] DOB: 27-Dec-1960 DOA: 03/20/2021 PCP: Kirk Ruths, MD   Assessment & Plan:   Active Problems:   DM2 (diabetes mellitus, type 2) (HCC)   HFrEF (heart failure with reduced ejection fraction) (HCC)   ESRD (end stage renal disease) ([facility])   DVT (deep venous thrombosis) (HCC)   COPD (chronic obstructive pulmonary disease) with emphysema (HCC)   Angioedema: likely secondary to ACE-I use. ACE-Is are on allergy list. S/p steroids. Swelling of face & lips has resolved  ESRD: on HD. HD as per nephro   HTN: continue amlodipine, carvedilol, imdur, & torsemide  CAD: stable. continue aspirin, carvedilol  HLD: continue on statin  DM2: poorly controlled. Continue on 70/30 & increased SSI to moderate w/ accuchecks. Will consult DM coordinator 03/23/21   Insomnia: continue on home dose of mirtazapine   COPD: w/o exacerbation. Continue on bronchodilators and encourage incentive spirometry   GERD: continue on PPI    DVT prophylaxis: lovenox  Code Status: full  Family Communication:  Disposition Plan: likely d/c back home  Level of care: Med-Surg   Status is: Observation  The patient remains OBS appropriate and will d/c before 2 midnights.  Dispo: The patient is from: Home              Anticipated d/c is to: Home              Patient currently is not medically stable to d/c.   Difficult to place patient No     Consultants:   nephro    Procedures:    Antimicrobials:    Subjective: Pt c/o nausea   Objective: Vitals:   03/21/21 0659 03/21/21 0741 03/21/21 0756 03/21/21 1158  BP: (!) 163/86 (!) 172/87 (!) 174/93 (!) 164/78  Pulse: 88 86 84 66  Resp: 20 18 18 15   Temp: 98 F (36.7 C) 98.7 F (37.1 C) 98.2 F (36.8 C) 98.4 F (36.9 C)  TempSrc:  Oral Oral Oral  SpO2: 98% 100% 99% 98%  Weight:      Height:        Intake/Output Summary (Last 24 hours) at 03/21/2021 1211 Last data filed at 03/21/2021 7902 Gross  per 24 hour  Intake 100 ml  Output 1500 ml  Net -1400 ml   Filed Weights   03/20/21 1217 03/20/21 2257  Weight: 78.5 kg 78.1 kg    Examination:  General exam: Appears calm but uncomfortable  Respiratory system: Clear to auscultation. Respiratory effort normal. Cardiovascular system: S1 & S2 +. No rubs, gallops or clicks.  Gastrointestinal system: Abdomen is nondistended, soft and nontender. Normal bowel sounds heard. Central nervous system: Alert and oriented. Moves all 4 extremities  Psychiatry: Judgement and insight appear normal. Flat mood and affect.     Data Reviewed: I have personally reviewed following labs and imaging studies  CBC: Recent Labs  Lab 03/20/21 1247  WBC 7.0  NEUTROABS 4.5  HGB 12.6  HCT 39.4  MCV 84.7  PLT 409   Basic Metabolic Panel: Recent Labs  Lab 03/20/21 1247 03/21/21 0614  NA 134* 130*  K 4.2 4.1  CL 98 94*  CO2 23 24  GLUCOSE 469* 586*  BUN 29* 27*  CREATININE 5.23* 3.78*  CALCIUM 8.4* 8.5*   GFR: Estimated Creatinine Clearance: 17.1 mL/min (A) (by C-G formula based on SCr of 3.78 mg/dL (H)). Liver Function Tests: No results for input(s): AST, ALT, ALKPHOS, BILITOT, PROT, ALBUMIN in the last 168  hours. No results for input(s): LIPASE, AMYLASE in the last 168 hours. No results for input(s): AMMONIA in the last 168 hours. Coagulation Profile: No results for input(s): INR, PROTIME in the last 168 hours. Cardiac Enzymes: No results for input(s): CKTOTAL, CKMB, CKMBINDEX, TROPONINI in the last 168 hours. BNP (last 3 results) No results for input(s): PROBNP in the last 8760 hours. HbA1C: No results for input(s): HGBA1C in the last 72 hours. CBG: Recent Labs  Lab 03/21/21 0713 03/21/21 0743 03/21/21 0846 03/21/21 1004 03/21/21 1200  GLUCAP 442* 493* 491* 300* 282*   Lipid Profile: No results for input(s): CHOL, HDL, LDLCALC, TRIG, CHOLHDL, LDLDIRECT in the last 72 hours. Thyroid Function Tests: No results for input(s):  TSH, T4TOTAL, FREET4, T3FREE, THYROIDAB in the last 72 hours. Anemia Panel: No results for input(s): VITAMINB12, FOLATE, FERRITIN, TIBC, IRON, RETICCTPCT in the last 72 hours. Sepsis Labs: No results for input(s): PROCALCITON, LATICACIDVEN in the last 168 hours.  Recent Results (from the past 240 hour(s))  Resp Panel by RT-PCR (Flu A&B, Covid) Nasopharyngeal Swab     Status: None   Collection Time: 03/20/21  3:25 PM   Specimen: Nasopharyngeal Swab; Nasopharyngeal(NP) swabs in vial transport medium  Result Value Ref Range Status   SARS Coronavirus 2 by RT PCR NEGATIVE NEGATIVE Final    Comment: (NOTE) SARS-CoV-2 target nucleic acids are NOT DETECTED.  The SARS-CoV-2 RNA is generally detectable in upper respiratory specimens during the acute phase of infection. The lowest concentration of SARS-CoV-2 viral copies this assay can detect is 138 copies/mL. A negative result does not preclude SARS-Cov-2 infection and should not be used as the sole basis for treatment or other patient management decisions. A negative result may occur with  improper specimen collection/handling, submission of specimen other than nasopharyngeal swab, presence of viral mutation(s) within the areas targeted by this assay, and inadequate number of viral copies(<138 copies/mL). A negative result must be combined with clinical observations, patient history, and epidemiological information. The expected result is Negative.  Fact Sheet for Patients:  EntrepreneurPulse.com.au  Fact Sheet for Healthcare Providers:  IncredibleEmployment.be  This test is no t yet approved or cleared by the [place] FDA and  has been authorized for detection and/or diagnosis of SARS-CoV-2 by FDA under an Emergency Use Authorization (EUA). This EUA will remain  in effect (meaning this test can be used) for the duration of the COVID-19 declaration under Section 564(b)(1) of the Act,  21 U.S.C.section 360bbb-3(b)(1), unless the authorization is terminated  or revoked sooner.       Influenza A by PCR NEGATIVE NEGATIVE Final   Influenza B by PCR NEGATIVE NEGATIVE Final    Comment: (NOTE) The Xpert Xpress SARS-CoV-2/FLU/RSV plus assay is intended as an aid in the diagnosis of influenza from Nasopharyngeal swab specimens and should not be used as a sole basis for treatment. Nasal washings and aspirates are unacceptable for Xpert Xpress SARS-CoV-2/FLU/RSV testing.  Fact Sheet for Patients: EntrepreneurPulse.com.au  Fact Sheet for Healthcare Providers: IncredibleEmployment.be  This test is not yet approved or cleared by the [place] FDA and has been authorized for detection and/or diagnosis of SARS-CoV-2 by FDA under an Emergency Use Authorization (EUA). This EUA will remain in effect (meaning this test can be used) for the duration of the COVID-19 declaration under Section 564(b)(1) of the Act, 21 U.S.C. section 360bbb-3(b)(1), unless the authorization is terminated or revoked.  Performed at [facility], [address] [place], [place] [address]  Radiology Studies: No results found.      Scheduled Meds: . amLODipine  10 mg Oral Daily  . aspirin EC  81 mg Oral Daily  . atorvastatin  80 mg Oral q1800  . carvedilol  25 mg Oral BID WC  . Chlorhexidine Gluconate Cloth  6 each Topical Q0600  . enoxaparin (LOVENOX) injection  30 mg Subcutaneous Q24H  . furosemide  40 mg Oral Daily  . insulin aspart  0-15 Units Subcutaneous Q4H  . isosorbide mononitrate  30 mg Oral Daily  . mirtazapine  30 mg Oral QHS  . mometasone-formoterol  2 puff Inhalation BID  . pantoprazole  40 mg Oral Daily  . topiramate  25 mg Oral Daily  . vitamin B-12  1,000 mcg Oral Daily   Continuous Infusions: . sodium chloride       LOS: 0 days    Time spent: 33 mins    Wyvonnia Dusky, MD [facility] Pager [phone]  If 7PM-7AM, please contact night-coverage 03/21/2021, 12:11 PM

## 2021-03-22 DIAGNOSIS — E1122 Type 2 diabetes mellitus with diabetic chronic kidney disease: Secondary | ICD-10-CM | POA: Diagnosis not present

## 2021-03-22 DIAGNOSIS — D631 Anemia in chronic kidney disease: Secondary | ICD-10-CM | POA: Diagnosis not present

## 2021-03-22 DIAGNOSIS — Z794 Long term (current) use of insulin: Secondary | ICD-10-CM | POA: Diagnosis not present

## 2021-03-22 DIAGNOSIS — T783XXA Angioneurotic edema, initial encounter: Secondary | ICD-10-CM | POA: Diagnosis not present

## 2021-03-22 DIAGNOSIS — Z992 Dependence on renal dialysis: Secondary | ICD-10-CM | POA: Diagnosis not present

## 2021-03-22 DIAGNOSIS — N186 End stage renal disease: Secondary | ICD-10-CM | POA: Diagnosis not present

## 2021-03-22 LAB — CBC
HCT: 36.9 % (ref 36.0–46.0)
Hemoglobin: 12.1 g/dL (ref 12.0–15.0)
MCH: 27.8 pg (ref 26.0–34.0)
MCHC: 32.8 g/dL (ref 30.0–36.0)
MCV: 84.6 fL (ref 80.0–100.0)
Platelets: 316 10*3/uL (ref 150–400)
RBC: 4.36 MIL/uL (ref 3.87–5.11)
RDW: 14.2 % (ref 11.5–15.5)
WBC: 9.9 10*3/uL (ref 4.0–10.5)
nRBC: 0 % (ref 0.0–0.2)

## 2021-03-22 LAB — BASIC METABOLIC PANEL
Anion gap: 12 (ref 5–15)
BUN: 40 mg/dL — ABNORMAL HIGH (ref 6–20)
CO2: 25 mmol/L (ref 22–32)
Calcium: 8.3 mg/dL — ABNORMAL LOW (ref 8.9–10.3)
Chloride: 99 mmol/L (ref 98–111)
Creatinine, Ser: 4.97 mg/dL — ABNORMAL HIGH (ref 0.44–1.00)
GFR, Estimated: 10 mL/min — ABNORMAL LOW (ref >60)
Glucose, Bld: 222 mg/dL — ABNORMAL HIGH (ref 70–99)
Potassium: 3.5 mmol/L (ref 3.5–5.1)
Sodium: 136 mmol/L (ref 135–145)

## 2021-03-22 LAB — HEPATITIS B SURFACE ANTIBODY, QUANTITATIVE: Hep B S AB Quant (Post): 78.6 m[IU]/mL (ref >9.9)

## 2021-03-22 LAB — GLUCOSE, CAPILLARY
Glucose-Capillary: 179 mg/dL — ABNORMAL HIGH (ref 70–99)
Glucose-Capillary: 196 mg/dL — ABNORMAL HIGH (ref 70–99)
Glucose-Capillary: 204 mg/dL — ABNORMAL HIGH (ref 70–99)
Glucose-Capillary: 229 mg/dL — ABNORMAL HIGH (ref 70–99)

## 2021-03-22 NOTE — Progress Notes (Signed)
Pt stable for discharge, A/Ox4, VS as charted, Pt discharged home 14:50  via [address] [facility] transportation). Pt took all personal belongings, PIV removed, discharge education and discharge paperwork provided to patient, verbalized understanding of discharge education. Verdis Frederickson A Hayly Litsey 03/22/2021

## 2021-03-22 NOTE — Discharge Summary (Signed)
Physician Discharge Summary  Terry Arroyo QJF:[id number] DOB: [birth year] DOA: 03/20/2021  PCP: Kirk Ruths, MD  Admit date: 03/20/2021 Discharge date: 03/22/2021  Admitted From: home  Disposition: home   Recommendations for Outpatient Follow-up:  1. Follow up with PCP in 1-2 weeks 2. F/u w/ nephro in 2 weeks   Home Health: no  Equipment/Devices:   Discharge Condition: stable  CODE STATUS: full  Diet recommendation: Heart Healthy / Carb Modified  Brief/Interim Summary: HPI was taken from Dr. Dione Plover: Terry Arroyo is a 60 y.o. female with history of ESRD currently on dialysis, type 2 diabetes, HFrEF, COPD, DVT, who presents with facial swelling.  Patient reports that over the last several days she has been having intermittent swelling of her face, eyes, lips, and tongue.  This has been coming and going, but when it was noted today at dialysis she was sent to the ED for an evaluation.  She reports that she has never had her throat feels swollen but her tongue became concerning a large yesterday and made it difficult to eat or drink food.  She currently does not have any problem breathing, eating, or drinking.  She reports that she was placed back on lisinopril for blood pressure issues sometime in January.  Upon further recollection she thought it was strange that she was placed on lisinopril as she had previously been taken off of it around the same time that she had similar episodes of facial swelling several years ago in [place].  She was unaware that there was a connection between ACE inhibitor's and facial swelling.  In the ED vital signs notable only for hypertension, labs are unremarkable.  Exam was notable for some questionable periorbital edema as well as obvious swelling of the upper lip.  She was given IV methylprednisolone, p.o. prednisone, and admitted for further monitoring.  Hospital course from Dr. Jimmye Norman 4/2-03/22/21: Pt presented w/ angioedema likely secondary to  lisinopril. ACE-I were put on pt's allergy list and pt understands not take ACE-I anymore. Pt was given IV steroids and pt's swelling of the face & lips resolved prior to d/c. Furthermore, pt has poorly controlled DM2 and several episodes of hyperglycemia. The hyperglycemia episodes were likely caused by the steroid use. For more information, please see previous progress/consult notes.   Discharge Diagnoses:  Active Problems:   DM2 (diabetes mellitus, type 2) (HCC)   HFrEF (heart failure with reduced ejection fraction) (HCC)   ESRD (end stage renal disease) ([place])   DVT (deep venous thrombosis) (HCC)   COPD (chronic obstructive pulmonary disease) with emphysema (HCC)  Angioedema: likely secondary to ACE-I use. ACE-Is are on allergy list. S/p steroids. Swelling of face & lips has resolved  ESRD: on HD. HD as per nephro   HTN: continue amlodipine, carvedilol, imdur, & torsemide  CAD: stable. continue aspirin, carvedilol  HLD: continue on statin  DM2: poorly controlled. Continue on 70/30 & increased SSI to moderate w/ accuchecks. Will consult DM coordinator 03/23/21   Insomnia: continue on home dose of mirtazapine   COPD: w/o exacerbation. Continue on bronchodilators and encourage incentive spirometry   GERD: continue on PPI   Discharge Instructions  Discharge Instructions    Diet - low sodium heart healthy   Complete by: As directed    Diet Carb Modified   Complete by: As directed    Discharge instructions   Complete by: As directed    F/u w/ PCP in 1 week. F/u w/ nephro in 2 weeks  Increase activity slowly   Complete by: As directed    No wound care   Complete by: As directed      Allergies as of 03/22/2021      Reactions   Hydralazine Shortness Of Breath, Swelling   Body aches   Lisinopril Swelling   Angioedema   Shellfish Allergy Anaphylaxis   Shrimp/lobster  Shrimp/lobster    Kiwi Extract Swelling   Metformin Other (See Comments)   GI Intolerance   Tape Itching    Use paper tape whenever possible      Medication List    TAKE these medications   Accu-Chek Aviva Plus test strip Generic drug: glucose blood   Accu-Chek Softclix Lancets lancets   acetaminophen 500 MG tablet Commonly known as: TYLENOL Take 500-1,000 mg by mouth every 6 (six) hours as needed for mild pain or moderate pain.   amLODipine 10 MG tablet Commonly known as: NORVASC Take 1 tablet (10 mg total) by mouth daily.   ammonium lactate 12 % lotion Commonly known as: LAC-HYDRIN Apply 1 application topically as needed for dry skin.   aspirin 81 MG EC tablet Take 1 tablet (81 mg total) by mouth daily.   atorvastatin 80 MG tablet Commonly known as: LIPITOR Take 1 tablet (80 mg total) by mouth daily at 6 PM.   carvedilol 25 MG tablet Commonly known as: COREG Take 25 mg by mouth 2 (two) times daily with a meal.   fenofibrate 145 MG tablet Commonly known as: TRICOR Take 145 mg by mouth daily.   furosemide 40 MG tablet Commonly known as: LASIX Take 40 mg by mouth.   HYDROcodone-acetaminophen 5-325 MG tablet Commonly known as: Norco Take 1-2 tablets by mouth every 6 (six) hours as needed.   insulin aspart protamine- aspart (70-30) 100 UNIT/ML injection Commonly known as: NOVOLOG MIX 70/30 Inject 0.15 mLs (15 Units total) into the skin 2 (two) times daily with a meal. What changed: when to take this   isosorbide mononitrate 30 MG 24 hr tablet Commonly known as: IMDUR Take 30 mg by mouth daily.   mirtazapine 30 MG tablet Commonly known as: REMERON Take 1 tablet (30 mg total) by mouth at bedtime.   mometasone-formoterol 200-5 MCG/ACT Aero Commonly known as: DULERA Inhale 2 puffs into the lungs 2 (two) times daily.   omeprazole 20 MG capsule Commonly known as: PRILOSEC Take 20 mg by mouth daily.   polyethylene glycol 17 g packet Commonly known as: MIRALAX / GLYCOLAX Take 17 g by mouth daily.   topiramate 25 MG tablet Commonly known as: TOPAMAX Take 25  mg by mouth daily.   vitamin B-12 1000 MCG tablet Commonly known as: CYANOCOBALAMIN Take 1,000 mcg by mouth daily.       Allergies  Allergen Reactions  . Hydralazine Shortness Of Breath and Swelling    Body aches  . Lisinopril Swelling    Angioedema  . Shellfish Allergy Anaphylaxis    Shrimp/lobster  Shrimp/lobster    . Kiwi Extract Swelling  . Metformin Other (See Comments)    GI Intolerance  . Tape Itching    Use paper tape whenever possible    Consultations:  nephro    Procedures/Studies: VAS US DUPLEX DIALYSIS ACCESS (AVF, AVG)  Result Date: 03/11/2021 DIALYSIS ACCESS Reason for Exam: First look. Access Site: Right Upper Extremity. Access Type: Brachial-cephalic AVF. History: Created 01/28/2021. Limitations: Dialysis through right permacath so limited evaluation of CVS. Performing Technologist: Concha Norway RVT  Examination Guidelines: A complete evaluation includes  B-mode imaging, spectral Doppler, color Doppler, and power Doppler as needed of all accessible portions of each vessel. Unilateral testing is considered an integral part of a complete examination. Limited examinations for reoccurring indications may be performed as noted.  Findings: +--------------------+----------+-----------------+--------+ AVF                 PSV (cm/s)Flow Vol (mL/min)Comments +--------------------+----------+-----------------+--------+ Native artery inflow   127           885                +--------------------+----------+-----------------+--------+ AVF Anastomosis        142                              +--------------------+----------+-----------------+--------+  +------------+----------+-------------+----------+--------+ OUTFLOW VEINPSV (cm/s)Diameter (cm)Depth (cm)Describe +------------+----------+-------------+----------+--------+ Confluence     120                                    +------------+----------+-------------+----------+--------+ Prox UA         88                                     +------------+----------+-------------+----------+--------+ Mid UA         155                                    +------------+----------+-------------+----------+--------+ Dist UA        178        0.31                        +------------+----------+-------------+----------+--------+  +--------------+------------+----------+---------+---------+-------------------+                 Diameter  Depth (cm)Branching   PSV       Flow Volume                       (cm)                        (cm/s)       (ml/min)       +--------------+------------+----------+---------+---------+-------------------+ Rt Rad Art Dis                                  51                        +--------------+------------+----------+---------+---------+-------------------+ Antegrade                                                                 +--------------+------------+----------+---------+---------+-------------------+  Summary: Patent new right brachiocephalic AVF with no evidence of significant stenosis.  *See table(s) above for measurements and observations.  Diagnosing physician: Leotis Pain MD Electronically signed by Leotis Pain MD on 03/11/2021 at 3:00:36 PM.    --------------------------------------------------------------------------------   Final   \   Subjective: Pt denies any  complaints    Discharge Exam: Vitals:   03/22/21 0751 03/22/21 1211  BP: (!) 165/79 (!) 149/78  Pulse: 67 64  Resp: 17 17  Temp: 98.1 F (36.7 C) 98 F (36.7 C)  SpO2: 97% 100%   Vitals:   03/22/21 0004 03/22/21 0432 03/22/21 0751 03/22/21 1211  BP: 138/69 (!) 148/68 (!) 165/79 (!) 149/78  Pulse: (!) 59 62 67 64  Resp: 15 16 17 17   Temp: 97.9 F (36.6 C) 97.7 F (36.5 C) 98.1 F (36.7 C) 98 F (36.7 C)  TempSrc: Oral Oral Oral Oral  SpO2: 96% 99% 97% 100%  Weight:      Height:        General: Pt is alert, awake, not in acute distress Cardiovascular:  S1/S2 +, no rubs, no gallops Respiratory: CTA bilaterally, no wheezing, no rhonchi Abdominal: Soft, NT, ND, bowel sounds + Extremities: no edema, no cyanosis    The results of significant diagnostics from this hospitalization (including imaging, microbiology, ancillary and laboratory) are listed below for reference.     Microbiology: Recent Results (from the past 240 hour(s))  Resp Panel by RT-PCR (Flu A&B, Covid) Nasopharyngeal Swab     Status: None   Collection Time: 03/20/21  3:25 PM   Specimen: Nasopharyngeal Swab; Nasopharyngeal(NP) swabs in vial transport medium  Result Value Ref Range Status   SARS Coronavirus 2 by RT PCR NEGATIVE NEGATIVE Final    Comment: (NOTE) SARS-CoV-2 target nucleic acids are NOT DETECTED.  The SARS-CoV-2 RNA is generally detectable in upper respiratory specimens during the acute phase of infection. The lowest concentration of SARS-CoV-2 viral copies this assay can detect is 138 copies/mL. A negative result does not preclude SARS-Cov-2 infection and should not be used as the sole basis for treatment or other patient management decisions. A negative result may occur with  improper specimen collection/handling, submission of specimen other than nasopharyngeal swab, presence of viral mutation(s) within the areas targeted by this assay, and inadequate number of viral copies(<138 copies/mL). A negative result must be combined with clinical observations, patient history, and epidemiological information. The expected result is Negative.  Fact Sheet for Patients:  EntrepreneurPulse.com.au  Fact Sheet for Healthcare Providers:  IncredibleEmployment.be  This test is no t yet approved or cleared by the [place] FDA and  has been authorized for detection and/or diagnosis of SARS-CoV-2 by FDA under an Emergency Use Authorization (EUA). This EUA will remain  in effect (meaning this test can be used) for the duration of  the COVID-19 declaration under Section 564(b)(1) of the Act, 21 U.S.C.section 360bbb-3(b)(1), unless the authorization is terminated  or revoked sooner.       Influenza A by PCR NEGATIVE NEGATIVE Final   Influenza B by PCR NEGATIVE NEGATIVE Final    Comment: (NOTE) The Xpert Xpress SARS-CoV-2/FLU/RSV plus assay is intended as an aid in the diagnosis of influenza from Nasopharyngeal swab specimens and should not be used as a sole basis for treatment. Nasal washings and aspirates are unacceptable for Xpert Xpress SARS-CoV-2/FLU/RSV testing.  Fact Sheet for Patients: EntrepreneurPulse.com.au  Fact Sheet for Healthcare Providers: IncredibleEmployment.be  This test is not yet approved or cleared by the [place] FDA and has been authorized for detection and/or diagnosis of SARS-CoV-2 by FDA under an Emergency Use Authorization (EUA). This EUA will remain in effect (meaning this test can be used) for the duration of the COVID-19 declaration under Section 564(b)(1) of the Act, 21 U.S.C. section 360bbb-3(b)(1), unless the authorization is terminated  or revoked.  Performed at [facility], [address] [place],  [address]      Labs: BNP (last 3 results) Recent Labs    10/10/20 1134 10/21/20 1931 11/01/20 1520  BNP 1,251.0* 1,384.4* 2,263.3*   Basic Metabolic Panel: Recent Labs  Lab 03/20/21 1247 03/21/21 0614 03/22/21 0430  NA 134* 130* 136  K 4.2 4.1 3.5  CL 98 94* 99  CO2 23 24 25   GLUCOSE 469* 586* 222*  BUN 29* 27* 40*  CREATININE 5.23* 3.78* 4.97*  CALCIUM 8.4* 8.5* 8.3*   Liver Function Tests: No results for input(s): AST, ALT, ALKPHOS, BILITOT, PROT, ALBUMIN in the last 168 hours. No results for input(s): LIPASE, AMYLASE in the last 168 hours. No results for input(s): AMMONIA in the last 168 hours. CBC: Recent Labs  Lab 03/20/21 1247 03/22/21 0430  WBC 7.0 9.9  NEUTROABS 4.5  --   HGB 12.6  12.1  HCT 39.4 36.9  MCV 84.7 84.6  PLT 289 316   Cardiac Enzymes: No results for input(s): CKTOTAL, CKMB, CKMBINDEX, TROPONINI in the last 168 hours. BNP: Invalid input(s): POCBNP CBG: Recent Labs  Lab 03/21/21 2010 03/22/21 0005 03/22/21 0429 03/22/21 0750 03/22/21 1209  GLUCAP 235* 179* 204* 196* 229*   D-Dimer No results for input(s): DDIMER in the last 72 hours. Hgb A1c No results for input(s): HGBA1C in the last 72 hours. Lipid Profile No results for input(s): CHOL, HDL, LDLCALC, TRIG, CHOLHDL, LDLDIRECT in the last 72 hours. Thyroid function studies No results for input(s): TSH, T4TOTAL, T3FREE, THYROIDAB in the last 72 hours.  Invalid input(s): FREET3 Anemia work up No results for input(s): VITAMINB12, FOLATE, FERRITIN, TIBC, IRON, RETICCTPCT in the last 72 hours. Urinalysis    Component Value Date/Time   COLORURINE STRAW (A) 10/21/2020 0829   APPEARANCEUR CLEAR (A) 10/21/2020 0829   LABSPEC 1.007 10/21/2020 0829   PHURINE 7.0 10/21/2020 0829   GLUCOSEU 150 (A) 10/21/2020 0829   HGBUR NEGATIVE 10/21/2020 0829   BILIRUBINUR NEGATIVE 10/21/2020 0829   KETONESUR NEGATIVE 10/21/2020 0829   PROTEINUR >=300 (A) 10/21/2020 0829   NITRITE NEGATIVE 10/21/2020 0829   LEUKOCYTESUR SMALL (A) 10/21/2020 0829   Sepsis Labs Invalid input(s): PROCALCITONIN,  WBC,  LACTICIDVEN Microbiology Recent Results (from the past 240 hour(s))  Resp Panel by RT-PCR (Flu A&B, Covid) Nasopharyngeal Swab     Status: None   Collection Time: 03/20/21  3:25 PM   Specimen: Nasopharyngeal Swab; Nasopharyngeal(NP) swabs in vial transport medium  Result Value Ref Range Status   SARS Coronavirus 2 by RT PCR NEGATIVE NEGATIVE Final    Comment: (NOTE) SARS-CoV-2 target nucleic acids are NOT DETECTED.  The SARS-CoV-2 RNA is generally detectable in upper respiratory specimens during the acute phase of infection. The lowest concentration of SARS-CoV-2 viral copies this assay can detect is 138  copies/mL. A negative result does not preclude SARS-Cov-2 infection and should not be used as the sole basis for treatment or other patient management decisions. A negative result may occur with  improper specimen collection/handling, submission of specimen other than nasopharyngeal swab, presence of viral mutation(s) within the areas targeted by this assay, and inadequate number of viral copies(<138 copies/mL). A negative result must be combined with clinical observations, patient history, and epidemiological information. The expected result is Negative.  Fact Sheet for Patients:  EntrepreneurPulse.com.au  Fact Sheet for Healthcare Providers:  IncredibleEmployment.be  This test is no t yet approved or cleared by the [place] FDA and  has  been authorized for detection and/or diagnosis of SARS-CoV-2 by FDA under an Emergency Use Authorization (EUA). This EUA will remain  in effect (meaning this test can be used) for the duration of the COVID-19 declaration under Section 564(b)(1) of the Act, 21 U.S.C.section 360bbb-3(b)(1), unless the authorization is terminated  or revoked sooner.       Influenza A by PCR NEGATIVE NEGATIVE Final   Influenza B by PCR NEGATIVE NEGATIVE Final    Comment: (NOTE) The Xpert Xpress SARS-CoV-2/FLU/RSV plus assay is intended as an aid in the diagnosis of influenza from Nasopharyngeal swab specimens and should not be used as a sole basis for treatment. Nasal washings and aspirates are unacceptable for Xpert Xpress SARS-CoV-2/FLU/RSV testing.  Fact Sheet for Patients: EntrepreneurPulse.com.au  Fact Sheet for Healthcare Providers: IncredibleEmployment.be  This test is not yet approved or cleared by the [place] FDA and has been authorized for detection and/or diagnosis of SARS-CoV-2 by FDA under an Emergency Use Authorization (EUA). This EUA will remain in effect (meaning  this test can be used) for the duration of the COVID-19 declaration under Section 564(b)(1) of the Act, 21 U.S.C. section 360bbb-3(b)(1), unless the authorization is terminated or revoked.  Performed at [facility], [address], [place], [place] [address]      Time coordinating discharge: Over 30 minutes  SIGNED:   Wyvonnia Dusky, MD  [facility] 03/22/2021, 1:12 PM Pager   If 7PM-7AM, please contact night-coverage

## 2021-03-22 NOTE — Progress Notes (Signed)
[facility]  Dialysis Note   Subjective:   Terry Arroyo is a 60 y.o. female with established care at [place]. She has past medical history of ESRD on HD, hypertension, sleep apnea, CABG, depression, diabetes, CAD and COPD. Her dialysis clinic sent her to the ED with facial swelling that started last night.   Patient seen resting in bed States MD said she can go home Denies shortness of breath Denies nausea  Objective:  Vital signs in last 24 hours:  Temp:  [97.7 F (36.5 C)-98.4 F (36.9 C)] 98.1 F (36.7 C) (04/03 0751) Pulse Rate:  [59-67] 67 (04/03 0751) Resp:  [15-17] 17 (04/03 0751) BP: (124-165)/(68-83) 165/79 (04/03 0751) SpO2:  [96 %-100 %] 97 % (04/03 0751)  Weight change:  Filed Weights   03/20/21 1217 03/20/21 2257  Weight: 78.5 kg 78.1 kg    Intake/Output: I/O last 3 completed shifts: In: 580 [P.O.:480; I.V.:100] Out: 1500 [Other:1500]   Intake/Output this shift:  No intake/output data recorded.  Physical Exam: General: NAD resting comfortable  Head: Normocephalic, atraumatic. Moist oral mucosa  Eyes: Anicteric, PERRL  Neck: Supple, trachea midline  Lungs:  Clear to auscultation  Heart: Regular rate and rhythm  Abdomen:  Soft, nontender,   Extremities:  no peripheral edema.  Neurologic: Nonfocal, moving all four extremities  Skin: No lesions  Access: Rt IJ Permcath    Basic Metabolic Panel: Recent Labs  Lab 03/20/21 1247 03/21/21 0614 03/22/21 0430  NA 134* 130* 136  K 4.2 4.1 3.5  CL 98 94* 99  CO2 23 24 25   GLUCOSE 469* 586* 222*  BUN 29* 27* 40*  CREATININE 5.23* 3.78* 4.97*  CALCIUM 8.4* 8.5* 8.3*    Liver Function Tests: No results for input(s): AST, ALT, ALKPHOS, BILITOT, PROT, ALBUMIN in the last 168 hours. No results for input(s): LIPASE, AMYLASE in the last 168 hours. No results for input(s): AMMONIA in the last 168 hours.  CBC: Recent Labs  Lab 03/20/21 1247 03/22/21 0430  WBC 7.0 9.9  NEUTROABS 4.5  --    HGB 12.6 12.1  HCT 39.4 36.9  MCV 84.7 84.6  PLT 289 316    Cardiac Enzymes: No results for input(s): CKTOTAL, CKMB, CKMBINDEX, TROPONINI in the last 168 hours.  BNP: Invalid input(s): POCBNP  CBG: Recent Labs  Lab 03/21/21 1606 03/21/21 2010 03/22/21 0005 03/22/21 0429 03/22/21 0750  GLUCAP 387* 235* 179* 204* 196*    Microbiology: Results for orders placed or performed during the hospital encounter of 03/20/21  Resp Panel by RT-PCR (Flu A&B, Covid) Nasopharyngeal Swab     Status: None   Collection Time: 03/20/21  3:25 PM   Specimen: Nasopharyngeal Swab; Nasopharyngeal(NP) swabs in vial transport medium  Result Value Ref Range Status   SARS Coronavirus 2 by RT PCR NEGATIVE NEGATIVE Final    Comment: (NOTE) SARS-CoV-2 target nucleic acids are NOT DETECTED.  The SARS-CoV-2 RNA is generally detectable in upper respiratory specimens during the acute phase of infection. The lowest concentration of SARS-CoV-2 viral copies this assay can detect is 138 copies/mL. A negative result does not preclude SARS-Cov-2 infection and should not be used as the sole basis for treatment or other patient management decisions. A negative result may occur with  improper specimen collection/handling, submission of specimen other than nasopharyngeal swab, presence of viral mutation(s) within the areas targeted by this assay, and inadequate number of viral copies(<138 copies/mL). A negative result must be combined with clinical observations, patient history, and epidemiological information.  The expected result is Negative.  Fact Sheet for Patients:  EntrepreneurPulse.com.au  Fact Sheet for Healthcare Providers:  IncredibleEmployment.be  This test is no t yet approved or cleared by the [place] FDA and  has been authorized for detection and/or diagnosis of SARS-CoV-2 by FDA under an Emergency Use Authorization (EUA). This EUA will remain  in effect  (meaning this test can be used) for the duration of the COVID-19 declaration under Section 564(b)(1) of the Act, 21 U.S.C.section 360bbb-3(b)(1), unless the authorization is terminated  or revoked sooner.       Influenza A by PCR NEGATIVE NEGATIVE Final   Influenza B by PCR NEGATIVE NEGATIVE Final    Comment: (NOTE) The Xpert Xpress SARS-CoV-2/FLU/RSV plus assay is intended as an aid in the diagnosis of influenza from Nasopharyngeal swab specimens and should not be used as a sole basis for treatment. Nasal washings and aspirates are unacceptable for Xpert Xpress SARS-CoV-2/FLU/RSV testing.  Fact Sheet for Patients: EntrepreneurPulse.com.au  Fact Sheet for Healthcare Providers: IncredibleEmployment.be  This test is not yet approved or cleared by the [place] FDA and has been authorized for detection and/or diagnosis of SARS-CoV-2 by FDA under an Emergency Use Authorization (EUA). This EUA will remain in effect (meaning this test can be used) for the duration of the COVID-19 declaration under Section 564(b)(1) of the Act, 21 U.S.C. section 360bbb-3(b)(1), unless the authorization is terminated or revoked.  Performed at [facility], [address] [place],  [address]     Coagulation Studies: No results for input(s): LABPROT, INR in the last 72 hours.  Urinalysis: No results for input(s): COLORURINE, LABSPEC, PHURINE, GLUCOSEU, HGBUR, BILIRUBINUR, KETONESUR, PROTEINUR, UROBILINOGEN, NITRITE, LEUKOCYTESUR in the last 72 hours.  Invalid input(s): APPERANCEUR    Imaging: No results found.   Medications:       Assessment/ Plan:  [name] is a 60 y.o.  female with established care at [place]. She has past medical history of ESRD on HD, hypertension, sleep apnea, CABG, depression, diabetes, CAD and COPD. Her dialysis clinic sent her to the ED with facial swelling that started last night.   [facility]/ MWF/  [place]/ Rt IJ Permcath/ 74kg  1. Angioedema likely due to Lisinopril  -d/c Lisinopril and added to allergy list -given steriods  2. End Stage Renal Disease on hemodialysis: MWF  Last hemodialysis was 03/18/21  Next scheduled dialysis is Monday.    No results found for: POTASSIUM  Intake/Output Summary (Last 24 hours) at 03/22/2021 0947 Last data filed at 03/22/2021 0432 Gross per 24 hour  Intake 480 ml  Output 0 ml  Net 480 ml   3. Anemia of chronic kidney disease  Lab Results  Component Value Date   HGB 12.1 03/22/2021  Within acceptable range  4. Secondary Hyperparathyroidism: with outpatient labs:  Lab Results  Component Value Date   CALCIUM 8.3 (L) 03/22/2021   CAION 1.13 (L) 01/28/2021   PHOS 3.4 11/12/2020  Within acceptable range  5. Diabetes mellitus type II with chronic kidney disease: insulin dependent. Home regimen includes Aspart 15 units BID. Most recent hemoglobin A1c is 8.9 on 02/09/21.  - Given methylprednisolone  80mg  yesterday in the ED for angioedema and increased glucose that morning over 500. - Glucose controlled with SSi     LOS: 0 Casmira Cramer 4/3/20229:47 AM

## 2021-03-22 NOTE — TOC Transition Note (Signed)
Transition of Care [facility]) - CM/SW Discharge Note   Patient Details  Name: Terry Arroyo MRN: [id number] Date of Birth: 1961/08/09  Transition of [facility]) CM/SW Contact:  Harriet Masson, RN Phone Number: [phone] 03/22/2021, 1:33 PM   Clinical Narrative:    Requested for transportation needs for discharge today. Spoke with pt to assisted with transportation needs. Pt verified she lives alone and able to utilize [employer] or public transportation services. Pt able to afford her medications and get to her medical appointments. No additional needs.  Provided voucher today and confirmed address for discharge transportation home. No other issues to address at this time.   TOC available for any additional needs.   Final next level of care: Home/Self Care Barriers to Discharge: No Barriers Identified   Patient Goals and CMS Choice        Discharge Placement                    Patient and family notified of of transfer: 03/22/21  Discharge Plan and Services                                     Social Determinants of Health (SDOH) Interventions     Readmission Risk Interventions Readmission Risk Prevention Plan 11/03/2020 10/23/2020 10/12/2020  Transportation Screening Complete Complete Complete  Medication Review Press photographer) Complete Complete Complete  PCP or Specialist appointment within 3-5 days of discharge Not Complete Complete Complete  PCP/Specialist Appt Not Complete comments To be scheduled by unit secretary. - -  HRI or Home Care Consult Complete Complete -  SW Recovery Care/Counseling Consult Complete Complete Complete  Palliative Care Screening Not Applicable Not Applicable Not Applicable  Skilled Nursing Facility Patient Refused Not Applicable Not Complete  SNF Comments Patient refuses states she is able to take care of herself. - Patient pending evaluation  Some recent data might be hidden

## 2021-03-22 NOTE — Plan of Care (Signed)

## 2021-03-23 DIAGNOSIS — N186 End stage renal disease: Secondary | ICD-10-CM | POA: Diagnosis not present

## 2021-03-23 DIAGNOSIS — Z992 Dependence on renal dialysis: Secondary | ICD-10-CM | POA: Diagnosis not present

## 2021-03-24 LAB — HEPATITIS B DNA, ULTRAQUANTITATIVE, PCR
HBV DNA SERPL PCR-ACNC: NOT DETECTED IU/mL
HBV DNA SERPL PCR-LOG IU: UNDETERMINED log10 IU/mL

## 2021-03-25 DIAGNOSIS — Z992 Dependence on renal dialysis: Secondary | ICD-10-CM | POA: Diagnosis not present

## 2021-03-25 DIAGNOSIS — N186 End stage renal disease: Secondary | ICD-10-CM | POA: Diagnosis not present

## 2021-03-26 DIAGNOSIS — F419 Anxiety disorder, unspecified: Secondary | ICD-10-CM | POA: Diagnosis not present

## 2021-03-26 DIAGNOSIS — I2510 Atherosclerotic heart disease of native coronary artery without angina pectoris: Secondary | ICD-10-CM | POA: Diagnosis not present

## 2021-03-26 DIAGNOSIS — I5043 Acute on chronic combined systolic (congestive) and diastolic (congestive) heart failure: Secondary | ICD-10-CM | POA: Diagnosis not present

## 2021-03-26 DIAGNOSIS — J449 Chronic obstructive pulmonary disease, unspecified: Secondary | ICD-10-CM | POA: Diagnosis not present

## 2021-03-26 DIAGNOSIS — D631 Anemia in chronic kidney disease: Secondary | ICD-10-CM | POA: Diagnosis not present

## 2021-03-26 DIAGNOSIS — N186 End stage renal disease: Secondary | ICD-10-CM | POA: Diagnosis not present

## 2021-03-26 DIAGNOSIS — I132 Hypertensive heart and chronic kidney disease with heart failure and with stage 5 chronic kidney disease, or end stage renal disease: Secondary | ICD-10-CM | POA: Diagnosis not present

## 2021-03-26 DIAGNOSIS — E1122 Type 2 diabetes mellitus with diabetic chronic kidney disease: Secondary | ICD-10-CM | POA: Diagnosis not present

## 2021-03-26 DIAGNOSIS — F32A Depression, unspecified: Secondary | ICD-10-CM | POA: Diagnosis not present

## 2021-03-27 DIAGNOSIS — N186 End stage renal disease: Secondary | ICD-10-CM | POA: Diagnosis not present

## 2021-03-27 DIAGNOSIS — Z992 Dependence on renal dialysis: Secondary | ICD-10-CM | POA: Diagnosis not present

## 2021-03-30 DIAGNOSIS — Z992 Dependence on renal dialysis: Secondary | ICD-10-CM | POA: Diagnosis not present

## 2021-03-30 DIAGNOSIS — N186 End stage renal disease: Secondary | ICD-10-CM | POA: Diagnosis not present

## 2021-03-31 ENCOUNTER — Telehealth (INDEPENDENT_AMBULATORY_CARE_PROVIDER_SITE_OTHER): Payer: Self-pay

## 2021-03-31 NOTE — Telephone Encounter (Signed)
A fax and phone call from Lujean Rave was received from [place] to schedule the patient for a right arm fistulagram. Patient is scheduled with Dr. Delana Meyer on 04/07/21 with an arrival time of 10:15 am to the MM. Per pre-admit no covid testing needed. Pre-procedure instructions will be faxed to [facility] at [facility].

## 2021-04-01 ENCOUNTER — Ambulatory Visit: Payer: Medicare HMO

## 2021-04-01 ENCOUNTER — Other Ambulatory Visit (INDEPENDENT_AMBULATORY_CARE_PROVIDER_SITE_OTHER): Payer: Self-pay | Admitting: Nurse Practitioner

## 2021-04-01 DIAGNOSIS — N186 End stage renal disease: Secondary | ICD-10-CM | POA: Diagnosis not present

## 2021-04-01 DIAGNOSIS — Z992 Dependence on renal dialysis: Secondary | ICD-10-CM | POA: Diagnosis not present

## 2021-04-02 ENCOUNTER — Other Ambulatory Visit: Payer: Self-pay

## 2021-04-02 ENCOUNTER — Other Ambulatory Visit
Admission: RE | Admit: 2021-04-02 | Discharge: 2021-04-02 | Disposition: A | Discharge status: Home / Self Care | Payer: Medicare HMO | Source: Ambulatory Visit | Attending: Vascular Surgery | Admitting: Vascular Surgery

## 2021-04-02 DIAGNOSIS — Z20822 Contact with and (suspected) exposure to covid-19: Secondary | ICD-10-CM | POA: Diagnosis not present

## 2021-04-02 DIAGNOSIS — Z01812 Encounter for preprocedural laboratory examination: Secondary | ICD-10-CM | POA: Diagnosis not present

## 2021-04-02 LAB — SARS CORONAVIRUS 2 (TAT 6-24 HRS): SARS Coronavirus 2: NEGATIVE

## 2021-04-02 NOTE — Telephone Encounter (Signed)
Per Dr. Candiss Norse and Dr. Delana Meyer the patient has been scheduled for a right arm declot on 04/03/21 with a 12:00 pm arrival time to the MM. Covid testing is today 04/02/21 between 8-2 pm at the [facility]. Pre-procedure instructions were discussed with the patient and I spoke with Anderson Malta at [facility] regarding this procedure.

## 2021-04-03 ENCOUNTER — Ambulatory Visit
Admission: RE | Admit: 2021-04-03 | Discharge: 2021-04-03 | Disposition: A | Discharge status: Home / Self Care | Payer: Medicare HMO | Attending: Vascular Surgery | Admitting: Vascular Surgery

## 2021-04-03 ENCOUNTER — Other Ambulatory Visit: Payer: Self-pay

## 2021-04-03 ENCOUNTER — Other Ambulatory Visit (INDEPENDENT_AMBULATORY_CARE_PROVIDER_SITE_OTHER): Payer: Self-pay | Admitting: Nurse Practitioner

## 2021-04-03 ENCOUNTER — Encounter: Payer: Self-pay | Admitting: Vascular Surgery

## 2021-04-03 ENCOUNTER — Encounter
Admission: RE | Disposition: A | Discharge status: Home / Self Care | Payer: Self-pay | Source: Home / Self Care | Attending: Vascular Surgery

## 2021-04-03 DIAGNOSIS — N186 End stage renal disease: Secondary | ICD-10-CM | POA: Diagnosis not present

## 2021-04-03 DIAGNOSIS — I120 Hypertensive chronic kidney disease with stage 5 chronic kidney disease or end stage renal disease: Secondary | ICD-10-CM | POA: Insufficient documentation

## 2021-04-03 DIAGNOSIS — T82510A Breakdown (mechanical) of surgically created arteriovenous fistula, initial encounter: Secondary | ICD-10-CM | POA: Insufficient documentation

## 2021-04-03 DIAGNOSIS — Y841 Kidney dialysis as the cause of abnormal reaction of the patient, or of later complication, without mention of misadventure at the time of the procedure: Secondary | ICD-10-CM | POA: Insufficient documentation

## 2021-04-03 DIAGNOSIS — Z87891 Personal history of nicotine dependence: Secondary | ICD-10-CM | POA: Diagnosis not present

## 2021-04-03 DIAGNOSIS — Z888 Allergy status to other drugs, medicaments and biological substances status: Secondary | ICD-10-CM | POA: Insufficient documentation

## 2021-04-03 DIAGNOSIS — T82898A Other specified complication of vascular prosthetic devices, implants and grafts, initial encounter: Secondary | ICD-10-CM | POA: Diagnosis not present

## 2021-04-03 DIAGNOSIS — E1122 Type 2 diabetes mellitus with diabetic chronic kidney disease: Secondary | ICD-10-CM | POA: Diagnosis not present

## 2021-04-03 DIAGNOSIS — Z992 Dependence on renal dialysis: Secondary | ICD-10-CM | POA: Insufficient documentation

## 2021-04-03 DIAGNOSIS — Z7982 Long term (current) use of aspirin: Secondary | ICD-10-CM | POA: Diagnosis not present

## 2021-04-03 DIAGNOSIS — Z794 Long term (current) use of insulin: Secondary | ICD-10-CM | POA: Insufficient documentation

## 2021-04-03 DIAGNOSIS — Z91013 Allergy to seafood: Secondary | ICD-10-CM | POA: Insufficient documentation

## 2021-04-03 DIAGNOSIS — Z79899 Other long term (current) drug therapy: Secondary | ICD-10-CM | POA: Diagnosis not present

## 2021-04-03 HISTORY — PX: PERIPHERAL VASCULAR THROMBECTOMY: CATH118306

## 2021-04-03 LAB — GLUCOSE, CAPILLARY: Glucose-Capillary: 260 mg/dL — ABNORMAL HIGH (ref 70–99)

## 2021-04-03 LAB — POTASSIUM (ARMC VASCULAR LAB ONLY): Potassium (ARMC vascular lab): 5 (ref 3.5–5.1)

## 2021-04-03 SURGERY — PERIPHERAL VASCULAR THROMBECTOMY
Anesthesia: Moderate Sedation | Laterality: Right

## 2021-04-03 MED ORDER — FAMOTIDINE 20 MG PO TABS
ORAL_TABLET | ORAL | Status: AC
  Administered 2021-04-03: 40 mg via ORAL
  Filled 2021-04-03: qty 2

## 2021-04-03 MED ORDER — MIDAZOLAM HCL 2 MG/ML PO SYRP: 8.0000 mg | ORAL_SOLUTION | Freq: Once | ORAL | Status: DC | PRN

## 2021-04-03 MED ORDER — DIPHENHYDRAMINE HCL 50 MG/ML IJ SOLN
50.0000 mg | Freq: Once | INTRAMUSCULAR | Status: AC | PRN
  Administered 2021-04-03: 50 mg via INTRAVENOUS

## 2021-04-03 MED ORDER — MIDAZOLAM HCL 2 MG/2ML IJ SOLN
INTRAMUSCULAR | Status: AC
  Filled 2021-04-03: qty 2

## 2021-04-03 MED ORDER — ONDANSETRON HCL 4 MG/2ML IJ SOLN: 4.0000 mg | Freq: Four times a day (QID) | INTRAMUSCULAR | Status: DC | PRN

## 2021-04-03 MED ORDER — FAMOTIDINE 20 MG PO TABS: 40.0000 mg | ORAL_TABLET | Freq: Once | ORAL | Status: AC | PRN

## 2021-04-03 MED ORDER — DIPHENHYDRAMINE HCL 50 MG/ML IJ SOLN
INTRAMUSCULAR | Status: AC
  Filled 2021-04-03: qty 1

## 2021-04-03 MED ORDER — METHYLPREDNISOLONE SODIUM SUCC 125 MG IJ SOLR: 125.0000 mg | Freq: Once | INTRAMUSCULAR | Status: AC | PRN

## 2021-04-03 MED ORDER — SODIUM CHLORIDE 0.9 % IV SOLN
INTRAVENOUS | Status: DC
  Administered 2021-04-03: 1000 mL via INTRAVENOUS

## 2021-04-03 MED ORDER — HYDROMORPHONE HCL 1 MG/ML IJ SOLN: 1.0000 mg | Freq: Once | INTRAMUSCULAR | Status: DC | PRN

## 2021-04-03 MED ORDER — ALTEPLASE 2 MG IJ SOLR
INTRAMUSCULAR | Status: AC
  Filled 2021-04-03: qty 4

## 2021-04-03 MED ORDER — IODIXANOL 320 MG/ML IV SOLN
INTRAVENOUS | Status: DC | PRN
  Administered 2021-04-03: 10 mL

## 2021-04-03 MED ORDER — FENTANYL CITRATE (PF) 100 MCG/2ML IJ SOLN
INTRAMUSCULAR | Status: DC | PRN
  Administered 2021-04-03 (×2): 50 ug via INTRAVENOUS

## 2021-04-03 MED ORDER — CEFAZOLIN SODIUM-DEXTROSE 1-4 GM/50ML-% IV SOLN: 1.0000 g | Freq: Once | INTRAVENOUS | Status: AC

## 2021-04-03 MED ORDER — CEFAZOLIN SODIUM-DEXTROSE 1-4 GM/50ML-% IV SOLN
INTRAVENOUS | Status: AC
  Administered 2021-04-03: 1 g via INTRAVENOUS
  Filled 2021-04-03: qty 50

## 2021-04-03 MED ORDER — METHYLPREDNISOLONE SODIUM SUCC 125 MG IJ SOLR
INTRAMUSCULAR | Status: AC
  Administered 2021-04-03: 125 mg via INTRAVENOUS
  Filled 2021-04-03: qty 2

## 2021-04-03 MED ORDER — FENTANYL CITRATE (PF) 100 MCG/2ML IJ SOLN
INTRAMUSCULAR | Status: AC
  Filled 2021-04-03: qty 2

## 2021-04-03 MED ORDER — HEPARIN SODIUM (PORCINE) 1000 UNIT/ML IJ SOLN
INTRAMUSCULAR | Status: AC
  Filled 2021-04-03: qty 1

## 2021-04-03 MED ORDER — MIDAZOLAM HCL 2 MG/2ML IJ SOLN
INTRAMUSCULAR | Status: DC | PRN
  Administered 2021-04-03: 2 mg via INTRAVENOUS

## 2021-04-03 SURGICAL SUPPLY — 7 items
CANNULA 5F STIFF (CANNULA) ×2 IMPLANT
CATH EMBOLECTOMY 5FR (BALLOONS) IMPLANT
COVER PROBE U/S 5X48 (MISCELLANEOUS) ×2 IMPLANT
DRAPE BRACHIAL (DRAPES) ×2 IMPLANT
PACK ANGIOGRAPHY (CUSTOM PROCEDURE TRAY) ×2 IMPLANT
SHEATH BRITE TIP 7FRX5.5 (SHEATH) ×2 IMPLANT
SUT MNCRL AB 4-0 PS2 18 (SUTURE) ×2 IMPLANT

## 2021-04-03 NOTE — Op Note (Signed)
OPERATIVE NOTE   PROCEDURE: 1. Contrast injection right arm brachiocephalic fistula  PRE-OPERATIVE DIAGNOSIS: Complication of dialysis access                                                       End Stage Renal Disease  POST-OPERATIVE DIAGNOSIS: same as above   SURGEON: Katha Cabal, M.D.  ANESTHESIA: Conscious sedation was administered under my direct supervision by the interventional radiology RN.  IV Versed plus fentanyl were utilized. Continuous ECG, pulse oximetry and blood pressure was monitored throughout the entire procedure.  Conscious sedation was for a total of 19 minutes 23 seconds minutes.  ESTIMATED BLOOD LOSS: minimal  FINDING(S): 1. Sclerotic threadlike cephalic vein extending all the way to the level of the deltoid  SPECIMEN(S):  None  CONTRAST: 10 cc  FLUOROSCOPY TIME: 0.2 minutes  INDICATIONS: Terry Arroyo is a 60 y.o. female who  presents with malfunctioning right arm AV access.  The patient is scheduled for angiography with possible intervention of the AV access to prevent loss of the permanent access.  The patient is aware the risks include but are not limited to: bleeding, infection, thrombosis of the cannulated access, and possible anaphylactic reaction to the contrast.  The patient acknowledges if the access can not be salvaged a tunneled catheter will be needed and will be placed during this procedure.  The patient is aware of the risks of the procedure and elects to proceed with the angiogram and intervention.  DESCRIPTION: After full informed written consent was obtained, the patient was brought back to the Special Procedure suite and placed supine position.  Appropriate cardiopulmonary monitors were placed.  The right arm was prepped and draped in the standard fashion.  Appropriate timeout is called.   The right brachiocephalic access was cannulated with a micropuncture needle under ultrasound guidence.  Ultrasound was used to evaluate the right  brachiocephalic access.  It was echolucent and compressible indicating it is patent .  An ultrasound image was acquired for the permanent record.  A micropuncture needle was used to access the right brachiocephalic access under direct ultrasound guidance.  The microwire was then advanced under fluoroscopic guidance without difficulty followed by the micro-sheath.  The J-wire was then advanced and a 6 Fr sheath inserted.  Hand injections were completed to image the access from the arterial anastomosis through the entire access.  The central venous structures were also imaged by hand injections.  Based on the images, the fistula is technically patent but functionally occluded.  Beginning at the level of the biceps the cephalic vein becomes threadlike considerably less then 1 mm in diameter and this extends all the way up to the level of the deltoid.  At the shoulder level the cephalic vein once again regains a normal caliber of 3 mm in the cephalic confluence and subclavian as well as the innominate and superior vena cava's are all widely patent.  This fistula will never mature it will be nonfunctional and attempts at salvage would not be appropriate.  Clearly the segment at the level of the arterial anastomosis is patent and can be used as the takeoff for a brachial axillary prosthetic graft.  A 4-0 Monocryl purse-string suture was sewn around the sheath.  The sheath was removed and light pressure was applied.  A sterile bandage was  applied to the puncture site.    COMPLICATIONS: None CONDITION: Unchanged  Katha Cabal, M.D [facility] Office: [phone]  04/03/2021 8:31 PM

## 2021-04-03 NOTE — Interval H&P Note (Signed)
History and Physical Interval Note:  04/03/2021 2:13 PM  Terry Arroyo  has presented today for surgery, with the diagnosis of RT arm declot   End Stage Renal Covid April 14.  The various methods of treatment have been discussed with the patient and family. After consideration of risks, benefits and other options for treatment, the patient has consented to  Procedure(s): PERIPHERAL VASCULAR THROMBECTOMY (Right) as a surgical intervention.  The patient's history has been reviewed, patient examined, no change in status, stable for surgery.  I have reviewed the patient's chart and labs.  Questions were answered to the patient's satisfaction.     [name]

## 2021-04-04 DIAGNOSIS — Z992 Dependence on renal dialysis: Secondary | ICD-10-CM | POA: Diagnosis not present

## 2021-04-04 DIAGNOSIS — N186 End stage renal disease: Secondary | ICD-10-CM | POA: Diagnosis not present

## 2021-04-06 ENCOUNTER — Encounter: Payer: Self-pay | Admitting: Vascular Surgery

## 2021-04-06 DIAGNOSIS — Z992 Dependence on renal dialysis: Secondary | ICD-10-CM | POA: Diagnosis not present

## 2021-04-06 DIAGNOSIS — N186 End stage renal disease: Secondary | ICD-10-CM | POA: Diagnosis not present

## 2021-04-07 ENCOUNTER — Ambulatory Visit: Admission: RE | Admit: 2021-04-07 | Payer: Medicare HMO | Source: Home / Self Care | Admitting: Vascular Surgery

## 2021-04-07 ENCOUNTER — Encounter: Admission: RE | Payer: Self-pay | Source: Home / Self Care

## 2021-04-07 DIAGNOSIS — E785 Hyperlipidemia, unspecified: Secondary | ICD-10-CM | POA: Diagnosis not present

## 2021-04-07 DIAGNOSIS — I130 Hypertensive heart and chronic kidney disease with heart failure and stage 1 through stage 4 chronic kidney disease, or unspecified chronic kidney disease: Secondary | ICD-10-CM | POA: Diagnosis not present

## 2021-04-07 DIAGNOSIS — E1122 Type 2 diabetes mellitus with diabetic chronic kidney disease: Secondary | ICD-10-CM | POA: Diagnosis not present

## 2021-04-07 DIAGNOSIS — N186 End stage renal disease: Secondary | ICD-10-CM | POA: Diagnosis not present

## 2021-04-07 DIAGNOSIS — I5042 Chronic combined systolic (congestive) and diastolic (congestive) heart failure: Secondary | ICD-10-CM | POA: Diagnosis not present

## 2021-04-07 DIAGNOSIS — J449 Chronic obstructive pulmonary disease, unspecified: Secondary | ICD-10-CM | POA: Diagnosis not present

## 2021-04-07 DIAGNOSIS — H548 Legal blindness, as defined in USA: Secondary | ICD-10-CM | POA: Diagnosis not present

## 2021-04-07 DIAGNOSIS — I2510 Atherosclerotic heart disease of native coronary artery without angina pectoris: Secondary | ICD-10-CM | POA: Diagnosis not present

## 2021-04-07 DIAGNOSIS — G4733 Obstructive sleep apnea (adult) (pediatric): Secondary | ICD-10-CM | POA: Diagnosis not present

## 2021-04-07 SURGERY — A/V FISTULAGRAM
Anesthesia: Moderate Sedation | Laterality: Right

## 2021-04-08 DIAGNOSIS — Z992 Dependence on renal dialysis: Secondary | ICD-10-CM | POA: Diagnosis not present

## 2021-04-08 DIAGNOSIS — N186 End stage renal disease: Secondary | ICD-10-CM | POA: Diagnosis not present

## 2021-04-09 DIAGNOSIS — F32A Depression, unspecified: Secondary | ICD-10-CM | POA: Diagnosis not present

## 2021-04-09 DIAGNOSIS — I132 Hypertensive heart and chronic kidney disease with heart failure and with stage 5 chronic kidney disease, or end stage renal disease: Secondary | ICD-10-CM | POA: Diagnosis not present

## 2021-04-09 DIAGNOSIS — F419 Anxiety disorder, unspecified: Secondary | ICD-10-CM | POA: Diagnosis not present

## 2021-04-09 DIAGNOSIS — J449 Chronic obstructive pulmonary disease, unspecified: Secondary | ICD-10-CM | POA: Diagnosis not present

## 2021-04-09 DIAGNOSIS — I2510 Atherosclerotic heart disease of native coronary artery without angina pectoris: Secondary | ICD-10-CM | POA: Diagnosis not present

## 2021-04-09 DIAGNOSIS — I5043 Acute on chronic combined systolic (congestive) and diastolic (congestive) heart failure: Secondary | ICD-10-CM | POA: Diagnosis not present

## 2021-04-09 DIAGNOSIS — N186 End stage renal disease: Secondary | ICD-10-CM | POA: Diagnosis not present

## 2021-04-09 DIAGNOSIS — D631 Anemia in chronic kidney disease: Secondary | ICD-10-CM | POA: Diagnosis not present

## 2021-04-09 DIAGNOSIS — E1122 Type 2 diabetes mellitus with diabetic chronic kidney disease: Secondary | ICD-10-CM | POA: Diagnosis not present

## 2021-04-10 DIAGNOSIS — Z992 Dependence on renal dialysis: Secondary | ICD-10-CM | POA: Diagnosis not present

## 2021-04-10 DIAGNOSIS — N186 End stage renal disease: Secondary | ICD-10-CM | POA: Diagnosis not present

## 2021-04-13 ENCOUNTER — Other Ambulatory Visit (INDEPENDENT_AMBULATORY_CARE_PROVIDER_SITE_OTHER): Payer: Self-pay | Admitting: Vascular Surgery

## 2021-04-13 DIAGNOSIS — Z992 Dependence on renal dialysis: Secondary | ICD-10-CM | POA: Diagnosis not present

## 2021-04-13 DIAGNOSIS — Z794 Long term (current) use of insulin: Secondary | ICD-10-CM | POA: Diagnosis not present

## 2021-04-13 DIAGNOSIS — N186 End stage renal disease: Secondary | ICD-10-CM | POA: Diagnosis not present

## 2021-04-13 DIAGNOSIS — T82868D Thrombosis of vascular prosthetic devices, implants and grafts, subsequent encounter: Secondary | ICD-10-CM

## 2021-04-13 DIAGNOSIS — E119 Type 2 diabetes mellitus without complications: Secondary | ICD-10-CM | POA: Diagnosis not present

## 2021-04-14 ENCOUNTER — Ambulatory Visit (INDEPENDENT_AMBULATORY_CARE_PROVIDER_SITE_OTHER): Payer: Medicare HMO | Admitting: Nurse Practitioner

## 2021-04-14 ENCOUNTER — Encounter (INDEPENDENT_AMBULATORY_CARE_PROVIDER_SITE_OTHER): Payer: Medicare HMO

## 2021-04-14 ENCOUNTER — Other Ambulatory Visit: Payer: Self-pay

## 2021-04-14 ENCOUNTER — Encounter (INDEPENDENT_AMBULATORY_CARE_PROVIDER_SITE_OTHER): Payer: Self-pay | Admitting: Nurse Practitioner

## 2021-04-14 ENCOUNTER — Ambulatory Visit (INDEPENDENT_AMBULATORY_CARE_PROVIDER_SITE_OTHER): Payer: Medicare HMO

## 2021-04-14 VITALS — BP 157/76 | HR 72 | Resp 16 | Wt 171.8 lb

## 2021-04-14 DIAGNOSIS — E785 Hyperlipidemia, unspecified: Secondary | ICD-10-CM

## 2021-04-14 DIAGNOSIS — N186 End stage renal disease: Secondary | ICD-10-CM | POA: Diagnosis not present

## 2021-04-14 DIAGNOSIS — T82868D Thrombosis of vascular prosthetic devices, implants and grafts, subsequent encounter: Secondary | ICD-10-CM | POA: Diagnosis not present

## 2021-04-14 DIAGNOSIS — E1159 Type 2 diabetes mellitus with other circulatory complications: Secondary | ICD-10-CM

## 2021-04-14 DIAGNOSIS — E1169 Type 2 diabetes mellitus with other specified complication: Secondary | ICD-10-CM

## 2021-04-14 DIAGNOSIS — I152 Hypertension secondary to endocrine disorders: Secondary | ICD-10-CM

## 2021-04-14 NOTE — Progress Notes (Signed)
Subjective:    Patient ID: Terry Arroyo, female    DOB: 01-02-1961, 60 y.o.   MRN: [id number] Chief Complaint  Patient presents with  . Follow-up    5 week ultrasound follow up    Terry Arroyo is a 60 year old female that presents today for evaluation following right upper extremity fistulogram.  The patient had a right brachiocephalic AV fistula placed on 01/28/2021 that failed to mature.  The patient underwent a fistulogram on 04/03/2021 which revealed:  "Based on the images, the fistula is technically patent but functionally occluded.  Beginning at the level of the biceps the cephalic vein becomes threadlike considerably less then 1 mm in diameter and this extends all the way up to the level of the deltoid.  At the shoulder level the cephalic vein once again regains a normal caliber of 3 mm in the cephalic confluence and subclavian as well as the innominate and superior vena cava's are all widely patent.  This fistula will never mature it will be nonfunctional and attempts at salvage would not be appropriate.  Clearly the segment at the level of the arterial anastomosis is patent and can be used as the takeoff for a brachial axillary prosthetic graft"  The patient continues to be maintained via PermCath.  She denies any issues following fistulogram.  She denies any fever or chills.  Noninvasive studies today confirm occlusion of brachiocephalic AV fistula   Review of Systems  HENT: Negative for facial swelling.   Hematological: Does not bruise/bleed easily.  All other systems reviewed and are negative.      Objective:   Physical Exam Vitals reviewed.  HENT:     Head: Normocephalic.  Cardiovascular:     Rate and Rhythm: Normal rate.     Pulses: Normal pulses.          Radial pulses are 2+ on the left side.  Pulmonary:     Effort: Pulmonary effort is normal.  Neurological:     Mental Status: She is alert and oriented to person, place, and time. Mental status is at baseline.   Psychiatric:        Mood and Affect: Mood normal.        Behavior: Behavior normal.        Thought Content: Thought content normal.        Judgment: Judgment normal.     BP (!) 157/76 (BP Location: Left Arm)   Pulse 72   Resp 16   Wt 171 lb 12.8 oz (77.9 kg)   BMI 27.73 kg/m   Past Medical History:  Diagnosis Date  . Anemia   . Angina at rest [facility])   . Angioedema 03/20/2021   Lisinopril  . Anterior cerebral aneurysm    approximately 5 mm; left MCA   . Anxiety   . Asthma   . Blind left eye   . COPD (chronic obstructive pulmonary disease) ([place])   . Coronary artery disease   . Depression   . Diabetes mellitus without complication ([place])   . ESRD (end stage renal disease) ([place])   . GERD (gastroesophageal reflux disease)   . HFrEF (heart failure with reduced ejection fraction) ([place])   . History of 2019 novel coronavirus disease (COVID-19) 03/2019   [place]  . History of DVT (deep vein thrombosis)   . Hx of CABG   . Hyperlipidemia   . Hypertension   . MI (myocardial infarction) ([place]) 2018  . Pituitary adenoma ([place])   .  Sleep apnea    non-compliant with nocturnal PAP therapy  . Stroke [facility]) 2014   stated affected left side.    Social History   Socioeconomic History  . Marital status: Single    Spouse name: Not on file  . Number of children: Not on file  . Years of education: Not on file  . Highest education level: Not on file  Occupational History  . Not on file  Tobacco Use  . Smoking status: Former Smoker    Quit date: 2018    Years since quitting: 4.3  . Smokeless tobacco: Never Used  Vaping Use  . Vaping Use: Never used  Substance and Sexual Activity  . Alcohol use: Not Currently  . Drug use: Not Currently  . Sexual activity: Not Currently  Other Topics Concern  . Not on file  Social History Narrative  . Not on file   Social Determinants of Health   Financial Resource Strain: Not on file  Food Insecurity: Not on  file  Transportation Needs: Not on file  Physical Activity: Not on file  Stress: Not on file  Social Connections: Not on file  Intimate Partner Violence: Not on file    Past Surgical History:  Procedure Laterality Date  . AV FISTULA PLACEMENT Right 01/28/2021   Procedure: ARTERIOVENOUS (AV) FISTULA CREATION;  Surgeon: Katha Cabal, MD;  Location: [facility] ORS;  Service: Vascular;  Laterality: Right;  . BRAIN SURGERY  1986   pituitary tumor  . BREAST EXCISIONAL BIOPSY Left yrs ago   benign  . CESAREAN SECTION    . CORONARY ARTERY BYPASS GRAFT  2018   done in [place]  . DIALYSIS/PERMA CATHETER INSERTION N/A 11/07/2020   Procedure: DIALYSIS/PERMA CATHETER INSERTION;  Surgeon: Algernon Huxley, MD;  Location: [place] CV LAB;  Service: Cardiovascular;  Laterality: N/A;  . EYE SURGERY Right 2021   cataracts   . PERIPHERAL VASCULAR THROMBECTOMY Right 04/03/2021   Procedure: A/V Fistulagram ;  Surgeon: Katha Cabal, MD;  Location: [place] CV LAB;  Service: Cardiovascular;  Laterality: Right;    Family History  Problem Relation Age of Onset  . CAD Mother   . CAD Brother   . Breast cancer Sister 51    Allergies  Allergen Reactions  . Hydralazine Shortness Of Breath and Swelling    Body aches Other reaction(s): Hives / Skin Rash  . Lisinopril Swelling    Angioedema  . Shellfish Allergy Anaphylaxis    Shrimp/lobster  Shrimp/lobster    . Kiwi Extract Swelling    Other reaction(s): Hives / Skin Rash  . Metformin Other (See Comments) and Diarrhea    GI Intolerance  . Tape Itching    Use paper tape whenever possible    CBC Latest Ref Rng & Units 03/22/2021 03/20/2021 02/11/2021  WBC 4.0 - 10.5 K/uL 9.9 7.0 8.1  Hemoglobin 12.0 - 15.0 g/dL 12.1 12.6 11.6(L)  Hematocrit 36.0 - 46.0 % 36.9 39.4 36.5  Platelets 150 - 400 K/uL 316 289 304      CMP     Component Value Date/Time   NA 136 03/22/2021 0430   NA 134 (L) 11/18/2013 0138   K 3.5 03/22/2021 0430   K 3.8  11/18/2013 0138   CL 99 03/22/2021 0430   CL 98 11/18/2013 0138   CO2 25 03/22/2021 0430   CO2 29 11/18/2013 0138   GLUCOSE 222 (H) 03/22/2021 0430   GLUCOSE 348 (H) 11/18/2013 0138   BUN 40 (H)  03/22/2021 0430   BUN 11 11/18/2013 0138   CREATININE 4.97 (H) 03/22/2021 0430   CREATININE 0.87 11/18/2013 0138   CALCIUM 8.3 (L) 03/22/2021 0430   CALCIUM 9.4 11/18/2013 0138   PROT 6.6 01/28/2021 0717   ALBUMIN 2.6 (L) 01/28/2021 0717   AST 12 (L) 01/28/2021 0717   ALT 10 01/28/2021 0717   ALKPHOS 80 01/28/2021 0717   BILITOT 0.4 01/28/2021 0717   GFRNONAA 10 (L) 03/22/2021 0430   GFRNONAA >60 11/18/2013 0138   GFRAA 18 (L) 09/15/2020 0403   GFRAA >60 11/18/2013 0138     No results found.     Assessment & Plan:   1. ESRD (end stage renal disease) ([place]) Recommend:  At this time the patient does not have appropriate extremity access for dialysis  Patient should have a right brachial axillary graft created using the segment at the arterial anastomosis for takeoff  The risks, benefits and alternative therapies were reviewed in detail with the patient.  All questions were answered.  The patient agrees to proceed with surgery.    2. Hyperlipidemia associated with type 2 diabetes mellitus ([place]) Continue statin as ordered and reviewed, no changes at this time   3. Hypertension associated with diabetes ([place]) Continue antihypertensive medications as already ordered, these medications have been reviewed and there are no changes at this time.   Current Outpatient Medications on File Prior to Visit  Medication Sig Dispense Refill  . ACCU-CHEK AVIVA PLUS test strip     . Accu-Chek Softclix Lancets lancets     . acetaminophen (TYLENOL) 500 MG tablet Take 500-1,000 mg by mouth every 6 (six) hours as needed for mild pain or moderate pain.    Marland Kitchen amLODipine (NORVASC) 10 MG tablet Take 1 tablet (10 mg total) by mouth daily. 30 tablet 0  . ammonium lactate (LAC-HYDRIN) 12 % lotion Apply 1  application topically as needed for dry skin.    Marland Kitchen aspirin 81 MG EC tablet Take 1 tablet (81 mg total) by mouth daily. 30 tablet 0  . atorvastatin (LIPITOR) 80 MG tablet Take 1 tablet (80 mg total) by mouth daily at 6 PM. 30 tablet 0  . carvedilol (COREG) 25 MG tablet Take 25 mg by mouth at bedtime.    . fenofibrate (TRICOR) 145 MG tablet Take 145 mg by mouth daily.    . furosemide (LASIX) 40 MG tablet Take 80 mg by mouth. Take 2 tablets daily    . insulin aspart protamine- aspart (NOVOLOG MIX 70/30) (70-30) 100 UNIT/ML injection Inject 0.15 mLs (15 Units total) into the skin 2 (two) times daily with a meal. (Patient taking differently: Inject 15 Units into the skin 3 (three) times daily with meals.) 10 mL 0  . isosorbide mononitrate (IMDUR) 30 MG 24 hr tablet Take 60 mg by mouth daily. Take 2 tablets daily    . mirtazapine (REMERON) 30 MG tablet Take 1 tablet (30 mg total) by mouth at bedtime. 30 tablet 0  . mometasone-formoterol (DULERA) 200-5 MCG/ACT AERO Inhale 2 puffs into the lungs 2 (two) times daily.    Marland Kitchen omeprazole (PRILOSEC) 40 MG capsule Take 40 mg by mouth daily.    Marland Kitchen topiramate (TOPAMAX) 25 MG tablet Take 25 mg by mouth daily.    . vitamin B-12 (CYANOCOBALAMIN) 1000 MCG tablet Take 1,000 mcg by mouth daily.    Marland Kitchen HYDROcodone-acetaminophen (NORCO) 5-325 MG tablet Take 1-2 tablets by mouth every 6 (six) hours as needed. (Patient not taking: Reported on 04/14/2021) 50  tablet 0  . polyethylene glycol (MIRALAX / GLYCOLAX) 17 g packet Take 17 g by mouth daily. (Patient not taking: No sig reported) 14 each 0   No current facility-administered medications on file prior to visit.    There are no Patient Instructions on file for this visit. No follow-ups on file.   Kris Hartmann, NP

## 2021-04-14 NOTE — H&P (View-Only) (Signed)
Subjective:    Patient ID: Terry Arroyo, female    DOB: 04-Jun-1961, 60 y.o.   MRN: [id number] Chief Complaint  Patient presents with  . Follow-up    5 week ultrasound follow up    Terry Arroyo is a 60 year old female that presents today for evaluation following right upper extremity fistulogram.  The patient had a right brachiocephalic AV fistula placed on 01/28/2021 that failed to mature.  The patient underwent a fistulogram on 04/03/2021 which revealed:  "Based on the images, the fistula is technically patent but functionally occluded.  Beginning at the level of the biceps the cephalic vein becomes threadlike considerably less then 1 mm in diameter and this extends all the way up to the level of the deltoid.  At the shoulder level the cephalic vein once again regains a normal caliber of 3 mm in the cephalic confluence and subclavian as well as the innominate and superior vena cava's are all widely patent.  This fistula will never mature it will be nonfunctional and attempts at salvage would not be appropriate.  Clearly the segment at the level of the arterial anastomosis is patent and can be used as the takeoff for a brachial axillary prosthetic graft"  The patient continues to be maintained via PermCath.  She denies any issues following fistulogram.  She denies any fever or chills.  Noninvasive studies today confirm occlusion of brachiocephalic AV fistula   Review of Systems  HENT: Negative for facial swelling.   Hematological: Does not bruise/bleed easily.  All other systems reviewed and are negative.      Objective:   Physical Exam Vitals reviewed.  HENT:     Head: Normocephalic.  Cardiovascular:     Rate and Rhythm: Normal rate.     Pulses: Normal pulses.          Radial pulses are 2+ on the left side.  Pulmonary:     Effort: Pulmonary effort is normal.  Neurological:     Mental Status: She is alert and oriented to person, place, and time. Mental status is at baseline.   Psychiatric:        Mood and Affect: Mood normal.        Behavior: Behavior normal.        Thought Content: Thought content normal.        Judgment: Judgment normal.     BP (!) 157/76 (BP Location: Left Arm)   Pulse 72   Resp 16   Wt 171 lb 12.8 oz (77.9 kg)   BMI 27.73 kg/m   Past Medical History:  Diagnosis Date  . Anemia   . Angina at rest [facility])   . Angioedema 03/20/2021   Lisinopril  . Anterior cerebral aneurysm    approximately 5 mm; left MCA   . Anxiety   . Asthma   . Blind left eye   . COPD (chronic obstructive pulmonary disease) ([place])   . Coronary artery disease   . Depression   . Diabetes mellitus without complication ([place])   . ESRD (end stage renal disease) ([place])   . GERD (gastroesophageal reflux disease)   . HFrEF (heart failure with reduced ejection fraction) ([place])   . History of 2019 novel coronavirus disease (COVID-19) 03/2019   [place]  . History of DVT (deep vein thrombosis)   . Hx of CABG   . Hyperlipidemia   . Hypertension   . MI (myocardial infarction) ([place]) 2018  . Pituitary adenoma ([place])   .  Sleep apnea    non-compliant with nocturnal PAP therapy  . Stroke [facility]) 2014   stated affected left side.    Social History   Socioeconomic History  . Marital status: Single    Spouse name: Not on file  . Number of children: Not on file  . Years of education: Not on file  . Highest education level: Not on file  Occupational History  . Not on file  Tobacco Use  . Smoking status: Former Smoker    Quit date: 2018    Years since quitting: 4.3  . Smokeless tobacco: Never Used  Vaping Use  . Vaping Use: Never used  Substance and Sexual Activity  . Alcohol use: Not Currently  . Drug use: Not Currently  . Sexual activity: Not Currently  Other Topics Concern  . Not on file  Social History Narrative  . Not on file   Social Determinants of Health   Financial Resource Strain: Not on file  Food Insecurity: Not on  file  Transportation Needs: Not on file  Physical Activity: Not on file  Stress: Not on file  Social Connections: Not on file  Intimate Partner Violence: Not on file    Past Surgical History:  Procedure Laterality Date  . AV FISTULA PLACEMENT Right 01/28/2021   Procedure: ARTERIOVENOUS (AV) FISTULA CREATION;  Surgeon: Katha Cabal, MD;  Location: [facility] ORS;  Service: Vascular;  Laterality: Right;  . BRAIN SURGERY  1986   pituitary tumor  . BREAST EXCISIONAL BIOPSY Left yrs ago   benign  . CESAREAN SECTION    . CORONARY ARTERY BYPASS GRAFT  2018   done in [place]  . DIALYSIS/PERMA CATHETER INSERTION N/A 11/07/2020   Procedure: DIALYSIS/PERMA CATHETER INSERTION;  Surgeon: Algernon Huxley, MD;  Location: [place] CV LAB;  Service: Cardiovascular;  Laterality: N/A;  . EYE SURGERY Right 2021   cataracts   . PERIPHERAL VASCULAR THROMBECTOMY Right 04/03/2021   Procedure: A/V Fistulagram ;  Surgeon: Katha Cabal, MD;  Location: [place] CV LAB;  Service: Cardiovascular;  Laterality: Right;    Family History  Problem Relation Age of Onset  . CAD Mother   . CAD Brother   . Breast cancer Sister 25    Allergies  Allergen Reactions  . Hydralazine Shortness Of Breath and Swelling    Body aches Other reaction(s): Hives / Skin Rash  . Lisinopril Swelling    Angioedema  . Shellfish Allergy Anaphylaxis    Shrimp/lobster  Shrimp/lobster    . Kiwi Extract Swelling    Other reaction(s): Hives / Skin Rash  . Metformin Other (See Comments) and Diarrhea    GI Intolerance  . Tape Itching    Use paper tape whenever possible    CBC Latest Ref Rng & Units 03/22/2021 03/20/2021 02/11/2021  WBC 4.0 - 10.5 K/uL 9.9 7.0 8.1  Hemoglobin 12.0 - 15.0 g/dL 12.1 12.6 11.6(L)  Hematocrit 36.0 - 46.0 % 36.9 39.4 36.5  Platelets 150 - 400 K/uL 316 289 304      CMP     Component Value Date/Time   NA 136 03/22/2021 0430   NA 134 (L) 11/18/2013 0138   K 3.5 03/22/2021 0430   K 3.8  11/18/2013 0138   CL 99 03/22/2021 0430   CL 98 11/18/2013 0138   CO2 25 03/22/2021 0430   CO2 29 11/18/2013 0138   GLUCOSE 222 (H) 03/22/2021 0430   GLUCOSE 348 (H) 11/18/2013 0138   BUN 40 (H)  03/22/2021 0430   BUN 11 11/18/2013 0138   CREATININE 4.97 (H) 03/22/2021 0430   CREATININE 0.87 11/18/2013 0138   CALCIUM 8.3 (L) 03/22/2021 0430   CALCIUM 9.4 11/18/2013 0138   PROT 6.6 01/28/2021 0717   ALBUMIN 2.6 (L) 01/28/2021 0717   AST 12 (L) 01/28/2021 0717   ALT 10 01/28/2021 0717   ALKPHOS 80 01/28/2021 0717   BILITOT 0.4 01/28/2021 0717   GFRNONAA 10 (L) 03/22/2021 0430   GFRNONAA >60 11/18/2013 0138   GFRAA 18 (L) 09/15/2020 0403   GFRAA >60 11/18/2013 0138     No results found.     Assessment & Plan:   1. ESRD (end stage renal disease) ([place]) Recommend:  At this time the patient does not have appropriate extremity access for dialysis  Patient should have a right brachial axillary graft created using the segment at the arterial anastomosis for takeoff  The risks, benefits and alternative therapies were reviewed in detail with the patient.  All questions were answered.  The patient agrees to proceed with surgery.    2. Hyperlipidemia associated with type 2 diabetes mellitus ([place]) Continue statin as ordered and reviewed, no changes at this time   3. Hypertension associated with diabetes ([place]) Continue antihypertensive medications as already ordered, these medications have been reviewed and there are no changes at this time.   Current Outpatient Medications on File Prior to Visit  Medication Sig Dispense Refill  . ACCU-CHEK AVIVA PLUS test strip     . Accu-Chek Softclix Lancets lancets     . acetaminophen (TYLENOL) 500 MG tablet Take 500-1,000 mg by mouth every 6 (six) hours as needed for mild pain or moderate pain.    Marland Kitchen amLODipine (NORVASC) 10 MG tablet Take 1 tablet (10 mg total) by mouth daily. 30 tablet 0  . ammonium lactate (LAC-HYDRIN) 12 % lotion Apply 1  application topically as needed for dry skin.    Marland Kitchen aspirin 81 MG EC tablet Take 1 tablet (81 mg total) by mouth daily. 30 tablet 0  . atorvastatin (LIPITOR) 80 MG tablet Take 1 tablet (80 mg total) by mouth daily at 6 PM. 30 tablet 0  . carvedilol (COREG) 25 MG tablet Take 25 mg by mouth at bedtime.    . fenofibrate (TRICOR) 145 MG tablet Take 145 mg by mouth daily.    . furosemide (LASIX) 40 MG tablet Take 80 mg by mouth. Take 2 tablets daily    . insulin aspart protamine- aspart (NOVOLOG MIX 70/30) (70-30) 100 UNIT/ML injection Inject 0.15 mLs (15 Units total) into the skin 2 (two) times daily with a meal. (Patient taking differently: Inject 15 Units into the skin 3 (three) times daily with meals.) 10 mL 0  . isosorbide mononitrate (IMDUR) 30 MG 24 hr tablet Take 60 mg by mouth daily. Take 2 tablets daily    . mirtazapine (REMERON) 30 MG tablet Take 1 tablet (30 mg total) by mouth at bedtime. 30 tablet 0  . mometasone-formoterol (DULERA) 200-5 MCG/ACT AERO Inhale 2 puffs into the lungs 2 (two) times daily.    Marland Kitchen omeprazole (PRILOSEC) 40 MG capsule Take 40 mg by mouth daily.    Marland Kitchen topiramate (TOPAMAX) 25 MG tablet Take 25 mg by mouth daily.    . vitamin B-12 (CYANOCOBALAMIN) 1000 MCG tablet Take 1,000 mcg by mouth daily.    Marland Kitchen HYDROcodone-acetaminophen (NORCO) 5-325 MG tablet Take 1-2 tablets by mouth every 6 (six) hours as needed. (Patient not taking: Reported on 04/14/2021) 50  tablet 0  . polyethylene glycol (MIRALAX / GLYCOLAX) 17 g packet Take 17 g by mouth daily. (Patient not taking: No sig reported) 14 each 0   No current facility-administered medications on file prior to visit.    There are no Patient Instructions on file for this visit. No follow-ups on file.   Kris Hartmann, NP

## 2021-04-15 ENCOUNTER — Telehealth (HOSPITAL_COMMUNITY): Payer: Self-pay

## 2021-04-15 DIAGNOSIS — Z992 Dependence on renal dialysis: Secondary | ICD-10-CM | POA: Diagnosis not present

## 2021-04-15 DIAGNOSIS — N186 End stage renal disease: Secondary | ICD-10-CM | POA: Diagnosis not present

## 2021-04-15 NOTE — Telephone Encounter (Signed)
Today spoke with Terry Arroyo.  She states she is doing ok.  She has everything she needs for daily living.  She has all her medications, they get delivered to her.  She is having a new graph placed sometime soon for her dialysis, she is not too happy with that.  The one they placed is not working and not healing.  She is aware of her appts and she has been going to all dialysis appts.  She uses transportation to go shopping and to her appts.  She states everything has been good at home and she is able to do things to keep it clean.  Will continue to visit for heart failure.   [place] [phone]

## 2021-04-16 DIAGNOSIS — R0602 Shortness of breath: Secondary | ICD-10-CM | POA: Diagnosis not present

## 2021-04-16 DIAGNOSIS — I208 Other forms of angina pectoris: Secondary | ICD-10-CM | POA: Diagnosis not present

## 2021-04-16 DIAGNOSIS — I2510 Atherosclerotic heart disease of native coronary artery without angina pectoris: Secondary | ICD-10-CM | POA: Diagnosis not present

## 2021-04-17 DIAGNOSIS — N186 End stage renal disease: Secondary | ICD-10-CM | POA: Diagnosis not present

## 2021-04-17 DIAGNOSIS — Z992 Dependence on renal dialysis: Secondary | ICD-10-CM | POA: Diagnosis not present

## 2021-04-18 DIAGNOSIS — Z992 Dependence on renal dialysis: Secondary | ICD-10-CM | POA: Diagnosis not present

## 2021-04-18 DIAGNOSIS — N186 End stage renal disease: Secondary | ICD-10-CM | POA: Diagnosis not present

## 2021-04-20 DIAGNOSIS — N186 End stage renal disease: Secondary | ICD-10-CM | POA: Diagnosis not present

## 2021-04-20 DIAGNOSIS — Z992 Dependence on renal dialysis: Secondary | ICD-10-CM | POA: Diagnosis not present

## 2021-04-22 DIAGNOSIS — Z992 Dependence on renal dialysis: Secondary | ICD-10-CM | POA: Diagnosis not present

## 2021-04-22 DIAGNOSIS — N186 End stage renal disease: Secondary | ICD-10-CM | POA: Diagnosis not present

## 2021-04-23 DIAGNOSIS — N186 End stage renal disease: Secondary | ICD-10-CM | POA: Diagnosis not present

## 2021-04-23 DIAGNOSIS — F419 Anxiety disorder, unspecified: Secondary | ICD-10-CM | POA: Diagnosis not present

## 2021-04-23 DIAGNOSIS — I5043 Acute on chronic combined systolic (congestive) and diastolic (congestive) heart failure: Secondary | ICD-10-CM | POA: Diagnosis not present

## 2021-04-23 DIAGNOSIS — I132 Hypertensive heart and chronic kidney disease with heart failure and with stage 5 chronic kidney disease, or end stage renal disease: Secondary | ICD-10-CM | POA: Diagnosis not present

## 2021-04-23 DIAGNOSIS — E1122 Type 2 diabetes mellitus with diabetic chronic kidney disease: Secondary | ICD-10-CM | POA: Diagnosis not present

## 2021-04-23 DIAGNOSIS — F32A Depression, unspecified: Secondary | ICD-10-CM | POA: Diagnosis not present

## 2021-04-23 DIAGNOSIS — I2510 Atherosclerotic heart disease of native coronary artery without angina pectoris: Secondary | ICD-10-CM | POA: Diagnosis not present

## 2021-04-23 DIAGNOSIS — J449 Chronic obstructive pulmonary disease, unspecified: Secondary | ICD-10-CM | POA: Diagnosis not present

## 2021-04-23 DIAGNOSIS — D631 Anemia in chronic kidney disease: Secondary | ICD-10-CM | POA: Diagnosis not present

## 2021-04-24 ENCOUNTER — Telehealth (INDEPENDENT_AMBULATORY_CARE_PROVIDER_SITE_OTHER): Payer: Self-pay

## 2021-04-24 DIAGNOSIS — Z992 Dependence on renal dialysis: Secondary | ICD-10-CM | POA: Diagnosis not present

## 2021-04-24 DIAGNOSIS — N186 End stage renal disease: Secondary | ICD-10-CM | POA: Diagnosis not present

## 2021-04-24 NOTE — Telephone Encounter (Signed)
A fax was received from [place] at  [facility] for the patient to be scheduled for a right arm graft with Dr. Delana Meyer. Patient is scheduled for right brachial axillary graft on 05/13/21 at the [facility]. Phone call pre-op on 05/07/21 between 1-5 pm. Pre-surgical  instructions will be faxed to [facility] at [facility].

## 2021-04-27 DIAGNOSIS — N186 End stage renal disease: Secondary | ICD-10-CM | POA: Diagnosis not present

## 2021-04-27 DIAGNOSIS — Z992 Dependence on renal dialysis: Secondary | ICD-10-CM | POA: Diagnosis not present

## 2021-04-29 DIAGNOSIS — Z992 Dependence on renal dialysis: Secondary | ICD-10-CM | POA: Diagnosis not present

## 2021-04-29 DIAGNOSIS — N186 End stage renal disease: Secondary | ICD-10-CM | POA: Diagnosis not present

## 2021-04-30 ENCOUNTER — Other Ambulatory Visit (INDEPENDENT_AMBULATORY_CARE_PROVIDER_SITE_OTHER): Payer: Self-pay | Admitting: Nurse Practitioner

## 2021-05-01 DIAGNOSIS — Z992 Dependence on renal dialysis: Secondary | ICD-10-CM | POA: Diagnosis not present

## 2021-05-01 DIAGNOSIS — N186 End stage renal disease: Secondary | ICD-10-CM | POA: Diagnosis not present

## 2021-05-04 DIAGNOSIS — N186 End stage renal disease: Secondary | ICD-10-CM | POA: Diagnosis not present

## 2021-05-04 DIAGNOSIS — Z992 Dependence on renal dialysis: Secondary | ICD-10-CM | POA: Diagnosis not present

## 2021-05-06 DIAGNOSIS — Z992 Dependence on renal dialysis: Secondary | ICD-10-CM | POA: Diagnosis not present

## 2021-05-06 DIAGNOSIS — N186 End stage renal disease: Secondary | ICD-10-CM | POA: Diagnosis not present

## 2021-05-07 ENCOUNTER — Encounter
Admission: RE | Admit: 2021-05-07 | Discharge: 2021-05-07 | Disposition: A | Discharge status: Home / Self Care | Payer: Medicare HMO | Source: Ambulatory Visit | Attending: Vascular Surgery | Admitting: Vascular Surgery

## 2021-05-07 ENCOUNTER — Other Ambulatory Visit
Admission: RE | Admit: 2021-05-07 | Discharge: 2021-05-07 | Disposition: A | Discharge status: Home / Self Care | Payer: Medicare HMO | Source: Ambulatory Visit | Attending: Vascular Surgery | Admitting: Vascular Surgery

## 2021-05-07 ENCOUNTER — Other Ambulatory Visit: Payer: Self-pay

## 2021-05-07 DIAGNOSIS — Z01818 Encounter for other preprocedural examination: Secondary | ICD-10-CM | POA: Diagnosis not present

## 2021-05-07 DIAGNOSIS — Z01810 Encounter for preprocedural cardiovascular examination: Secondary | ICD-10-CM | POA: Diagnosis not present

## 2021-05-07 HISTORY — DX: Other forms of dyspnea: R06.09

## 2021-05-07 HISTORY — DX: Unsteadiness on feet: R26.81

## 2021-05-07 HISTORY — DX: Dyspnea, unspecified: R06.00

## 2021-05-07 HISTORY — DX: Atherosclerosis of aorta: I70.0

## 2021-05-07 HISTORY — DX: Secondary hyperparathyroidism of renal origin: N25.81

## 2021-05-07 HISTORY — DX: Type 2 diabetes mellitus without complications: E11.9

## 2021-05-07 LAB — BASIC METABOLIC PANEL
Anion gap: 13 (ref 5–15)
BUN: 28 mg/dL — ABNORMAL HIGH (ref 6–20)
CO2: 23 mmol/L (ref 22–32)
Calcium: 8.8 mg/dL — ABNORMAL LOW (ref 8.9–10.3)
Chloride: 97 mmol/L — ABNORMAL LOW (ref 98–111)
Creatinine, Ser: 4.53 mg/dL — ABNORMAL HIGH (ref 0.44–1.00)
GFR, Estimated: 11 mL/min — ABNORMAL LOW (ref >60)
Glucose, Bld: 415 mg/dL — ABNORMAL HIGH (ref 70–99)
Potassium: 3.2 mmol/L — ABNORMAL LOW (ref 3.5–5.1)
Sodium: 133 mmol/L — ABNORMAL LOW (ref 135–145)

## 2021-05-07 LAB — CBC WITH DIFFERENTIAL/PLATELET
Abs Immature Granulocytes: 0.02 10*3/uL (ref 0.00–0.07)
Basophils Absolute: 0.1 10*3/uL (ref 0.0–0.1)
Basophils Relative: 1 %
Eosinophils Absolute: 0.4 10*3/uL (ref 0.0–0.5)
Eosinophils Relative: 4 %
HCT: 34.7 % — ABNORMAL LOW (ref 36.0–46.0)
Hemoglobin: 11.6 g/dL — ABNORMAL LOW (ref 12.0–15.0)
Immature Granulocytes: 0 %
Lymphocytes Relative: 17 %
Lymphs Abs: 1.5 10*3/uL (ref 0.7–4.0)
MCH: 28.1 pg (ref 26.0–34.0)
MCHC: 33.4 g/dL (ref 30.0–36.0)
MCV: 84 fL (ref 80.0–100.0)
Monocytes Absolute: 0.7 10*3/uL (ref 0.1–1.0)
Monocytes Relative: 7 %
Neutro Abs: 6.4 10*3/uL (ref 1.7–7.7)
Neutrophils Relative %: 71 %
Platelets: 318 10*3/uL (ref 150–400)
RBC: 4.13 MIL/uL (ref 3.87–5.11)
RDW: 15 % (ref 11.5–15.5)
WBC: 9 10*3/uL (ref 4.0–10.5)
nRBC: 0 % (ref 0.0–0.2)

## 2021-05-07 LAB — PROTIME-INR
INR: 1 (ref 0.8–1.2)
Prothrombin Time: 13 seconds (ref 11.4–15.2)

## 2021-05-07 LAB — TYPE AND SCREEN
ABO/RH(D): AB POS
Antibody Screen: NEGATIVE

## 2021-05-07 LAB — APTT: aPTT: 34 seconds (ref 24–36)

## 2021-05-07 NOTE — Progress Notes (Incomplete)
Perioperative Services Pre-Admission/Anesthesia Testing    Date: 05/08/21  Name: Terry Arroyo MRN:   [id number]  Re: Clearance for upcoming vascular procedure   Case: [id number] Date/Time: 05/13/21 1217   Procedure: INSERTION OF ARTERIOVENOUS (AV) GORE-TEX GRAFT ARM ( BRACHIAL AXILLARY ) (Right )   Anesthesia type: General   Pre-op diagnosis: ESRD   Location: [place] [facility] [place]   Surgeons: Katha Cabal, MD     Patient scheduled for the above procedure on 05/13/2021 with Dr. [name].  Of note, patient previously reviewed by PAT APP prior to a vascular procedure on 01/28/2021; see my note dated 01/27/2021.  Interval medical history reviewed:  Patient seen in the ED on 02/09/2021 for evaluation of chest pain and shortness of breath.  Patient presented to the ED via EMS from the dialysis center; did not have treatment.  Given SL NTG and ASA en route.  BP was 200/88 upon arrival.  Patient with acute pulmonary edema in the setting of ESRD; admitted for dialysis.  Patient dialyzed and returned to the ED.  She was discharged home in stable condition on 02/11/2021 with plans to follow-up with her PCP and heart failure clinic.  Patient to continue regular scheduled hemodialysis treatments on Mondays, Wednesdays, and Fridays.  Patient seen in the ED again on 03/20/2021 for angioedema and hoarse voice quality.  Patient brought in to the ED from dialysis center by EMS.  Symptoms reported to be intermittent x 2 days, however worse that morning.  Patient lips were very swollen, eyes were almost swollen shut, and she was having difficulty breathing. Did not complete her hemodialysis treatment that day.  EMS administered 50 mg of oral diphenhydramine; somewhat improved upon arrival.  Had recently been started on omeprazole, however she was also on an ACEi (lisinopril). Treated with steroids and admitted for observation. Dialyzed during her admission.  On the morning  of 03/21/2021, patient complained of nausea, vomiting, and diaphoresis. Blood glucose level was found to be 534 following IV corticosteroid dosing for angioedema reaction; treated with IV insulin.  She became lethargic and had a poor response to sternal rub and rapid response was called to stabilize patient. Changes were made to antidiabetic regimen.  Clinical condition improved and patient discharged home in stable condition 03/22/2021.  Patient followed regularly by the home based patient advocate program through the [facility] heart failure clinic. EMT-P contacts patient on a regular basis regarding her hear failure symptoms and blood glucose management. Efforts of program is to maintain patient optimization to prevent exacerbations of patient's chronic conditions that lead to ED visits and hospital admissions. [place], EMT-P working with patient frequently touches base to assess care needs and social determinants of care, which has been a wonderful and Marine scientist for this patient.   Patient has had no other acute events or changes to her medical history. Previously placed AV graft has been found to not be appropriate for hemodialysis, hence the need for the planned procedural course. Cardiology previously cleared patient for procedure, however given the length of time and recent ED admissions, updated clearance from cardiology provider was sought by the PAT team.  Per cardiology, "***.  Updated Pre-Operative  Work Up:   ECG: Date: 05/08/2021 Time ECG obtained: 1352 PM Rate: 58 bpm Rhythm: sinus bradycardia Axis (leads I and aVF): Normal Intervals: PR 156 ms. QRS 100 ms. QTc 465 ms.  ST segment and T wave changes: Nonspecific lateral ST and T wave abnormalities  Comparison: Similar to previous tracing obtained on 02/09/2021  LABS: Hospital Outpatient Visit on 05/07/2021  Component Date Value Ref Range Status   WBC 05/07/2021 9.0  4.0 - 10.5 K/uL Final   RBC 05/07/2021 4.13  3.87 - 5.11  MIL/uL Final   Hemoglobin 05/07/2021 11.6* 12.0 - 15.0 g/dL Final   HCT 05/07/2021 34.7* 36.0 - 46.0 % Final   MCV 05/07/2021 84.0  80.0 - 100.0 fL Final   MCH 05/07/2021 28.1  26.0 - 34.0 pg Final   MCHC 05/07/2021 33.4  30.0 - 36.0 g/dL Final   RDW 05/07/2021 15.0  11.5 - 15.5 % Final   Platelets 05/07/2021 318  150 - 400 K/uL Final   nRBC 05/07/2021 0.0  0.0 - 0.2 % Final   Neutrophils Relative % 05/07/2021 71  % Final   Neutro Abs 05/07/2021 6.4  1.7 - 7.7 K/uL Final   Lymphocytes Relative 05/07/2021 17  % Final   Lymphs Abs 05/07/2021 1.5  0.7 - 4.0 K/uL Final   Monocytes Relative 05/07/2021 7  % Final   Monocytes Absolute 05/07/2021 0.7  0.1 - 1.0 K/uL Final   Eosinophils Relative 05/07/2021 4  % Final   Eosinophils Absolute 05/07/2021 0.4  0.0 - 0.5 K/uL Final   Basophils Relative 05/07/2021 1  % Final   Basophils Absolute 05/07/2021 0.1  0.0 - 0.1 K/uL Final   Immature Granulocytes 05/07/2021 0  % Final   Abs Immature Granulocytes 05/07/2021 0.02  0.00 - 0.07 K/uL Final   Performed at [facility], [place], [place] [address]   Sodium 05/07/2021 133* 135 - 145 mmol/L Final   Potassium 05/07/2021 3.2* 3.5 - 5.1 mmol/L Final   Chloride 05/07/2021 97* 98 - 111 mmol/L Final   CO2 05/07/2021 23  22 - 32 mmol/L Final   Glucose, Bld 05/07/2021 415* 70 - 99 mg/dL Final   Glucose reference range applies only to samples taken after fasting for at least 8 hours.   BUN 05/07/2021 28* 6 - 20 mg/dL Final   Creatinine, Ser 05/07/2021 4.53* 0.44 - 1.00 mg/dL Final   Calcium 05/07/2021 8.8* 8.9 - 10.3 mg/dL Final   GFR, Estimated 05/07/2021 11* >60 mL/min Final   Comment: (NOTE) Calculated using the CKD-EPI Creatinine Equation (2021)    Anion gap 05/07/2021 13  5 - 15 Final   Performed at [facility], [address] [place], [place] [address]   Prothrombin Time 05/07/2021 13.0  11.4 - 15.2 seconds Final   INR 05/07/2021 1.0  0.8 - 1.2 Final    Comment: (NOTE) INR goal varies based on device and disease states. Performed at [facility], [address] [place], [place] [address]    aPTT 05/07/2021 34  24 - 36 seconds Final   Performed at [facility], [address] [place], [place] [address]   ABO/RH(D) 05/07/2021 AB POS   Final   Antibody Screen 05/07/2021 NEG   Final   Sample Expiration 05/07/2021 05/21/2021,2359   Final   Extend sample reason 05/07/2021    Final                   Value:NO TRANSFUSIONS OR PREGNANCY IN THE PAST 3 MONTHS Performed at [facility] [address] [place], [place] [address]     Impression and Plan:  Terry Arroyo has been referred for pre-anesthesia review and clearance prior to her undergoing the planned anesthetic and procedural courses. Available labs,  pertinent testing, and imaging results were personally reviewed by me. This patient has been appropriately cleared by cardiology with an overall *** risk of significant perioperative cardiovascular complications.  Based on clinical review performed today (05/08/21), barring any significant acute changes in the patient's overall condition, it is anticipated that she will be able to proceed with the planned surgical intervention. Any acute changes in clinical condition may necessitate her procedure being postponed and/or cancelled. Patient will meet with anesthesia team (MD and/or CRNA) on this day of her procedure for preoperative evaluation/assessment.   Pre-surgical instructions were reviewed with the patient during her PAT appointment and questions were fielded by PAT clinical staff. Patient was advised that if any questions or concerns arise prior to her procedure then she should return a call to PAT and/or her surgeon's office to discuss.  Honor Loh, MSN, APRN, FNP-C, CEN [facility]  Peri-operative Services Nurse Practitioner Phone: [phone] 05/08/21 4:47 PM  NOTE: This note has  been prepared using Dragon dictation software. Despite my best ability to proofread, there is always the potential that unintentional transcriptional errors may still occur from this process.

## 2021-05-07 NOTE — Patient Instructions (Signed)
INSTRUCTIONS FOR SURGERY     Your surgery is scheduled for: Wednesday, MAY 25TH       To find out your arrival time for the day of surgery,          please call [phone] between 1 pm and 3 pm on :  Tuesday, MAY 24TH     When you arrive for surgery, report to the [facility]. ONCE THEY HAVE COMPLETED THEIR PROCESS, PROCEED TO THE SECOND FLOOR AND SIGN IN AT THE SURGERY DESK.    REMEMBER: Instructions that are not followed completely may result in serious medical risk,  up to and including death, or upon the discretion of your surgeon and anesthesiologist,            your surgery may need to be rescheduled.  __X__ 1. Do not eat food after midnight the night before your procedure.                    No gum, candy, lozenger, tic tacs, tums or hard candies.                  ABSOLUTELY NOTHING SOLID IN YOUR MOUTH AFTER MIDNIGHT                    You may drink unlimited clear liquids up to 2 hours before you are scheduled to arrive for surgery.                   Do not drink anything within those 2 hours unless you need to take medicine, then take the                   smallest amount you need.  Clear liquids include:  water, Gatorade, Black coffee, black tea.                   Sugar may be added but no dairy/ honey /lemon.                        Broth and jello is not considered a clear liquid.  __x__  2. On the morning of surgery, please brush your teeth with toothpaste and water. You may rinse with                  mouthwash if you wish but DO NOT SWALLOW TOOTHPASTE OR MOUTHWASH  __X___3. NO alcohol for 24 hours before or after surgery.  __x___ 4.  Do NOT smoke or use e-cigarettes for 24 HOURS PRIOR TO SURGERY.                      DO NOT Use any chewable tobacco products for at least 6 hours prior to surgery.  __x___ 5. If you start any new medication after this appointment and prior to surgery,  please                   Bring it with you on the day of surgery.  ___x__ 6. Notify your doctor if there is any change in your medical condition, such  as fever,                  infection, vomitting, diarrhea or any open sores.  __x___ 7.  USE the CHG WIPES as instructed, the night before surgery and the day of surgery.                   Once you have washed with this soap, do NOT use any of the following: Powders, perfumes                    or lotions. Please do not wear make up, hairpins, clips or nail polish. You MAY NOT wear deodorant.                                                      Women need to shave 48 hours prior to surgery.                   DO NOT wear ANY jewelry on the day of surgery. If there are rings that are too tight to                    remove easily, please address this prior to the surgery day. Piercings need to be removed.                                                                     NO METAL ON YOUR BODY.                    Do NOT bring any valuables.  If you came to Pre-Admit testing then you will not need license,                     insurance card or credit card.  If you will be staying overnight, please either leave your things in                     the car or have your family be responsible for these items.                     [place] IS NOT RESPONSIBLE FOR BELONGINGS OR VALUABLES.  ___X__ 8. DO NOT wear contact lenses on surgery day.  You may not have dentures,                     Hearing aides, contacts or glasses in the operating room. These items can be                    Placed in the Recovery Room to receive immediately after surgery.  __x___ 9. IF YOU ARE SCHEDULED TO GO HOME ON THE SAME DAY, YOU MUST                   Have someone to drive you home and to stay with you  for the first 24 hours.  Have an arrangement prior to arriving on surgery day.  ___x__ 10. Take the following medications on the morning of surgery with  a sip of water:                              1. AMLODIPINE                     2. OMEPRAZOLE(take an extra dose the night before surgery)                     3. TRICOR                     4. IMDUR                     5.                    _____ 11.  Follow any instructions provided to you by your surgeon.                        Such as enema, clear liquid bowel prep  __X__  12. DO NOT STOP ASPIRIN PRIOR TO SURGERY,                        but DO NOT TAKE IT ON THE DAY OF SURGERY.                    STOP ALL OTHER ASPIRIN PRODUCTS                       THIS INCLUDES BC POWDERS / GOODIES POWDER / PEPTO BISMAL  __x___ 13. STOP Anti-inflammatories as of TODAY, MAY 19TH                       This includes IBUPROFEN / MOTRIN / ADVIL / ALEVE/ NAPROXYN                    YOU MAY TAKE TYLENOL ANY TIME PRIOR TO SURGERY.  _X____ 14.  Stop supplements until after surgery.                     This includes:VITAMIN B12  ___X___16. TAKE YOUR NOVOLOG AS USUAL ON THE DAY PRIOR TO SURGERY.                             Do NOT take any diabetes medications on surgery day.  ____X__17.  Continue to take the following medications but do not take on the morning of surgery:                         LASIX // ASPIRIN // INSULIN  ______18.  Wear clean and comfortable clothing to the hospital.  PLEASE BRING PHONE [place].  WEAR A SHIRT THAT WILL BE EASY TO GET INTO AFTER SURGERY.

## 2021-05-08 ENCOUNTER — Other Ambulatory Visit: Payer: Medicare HMO

## 2021-05-08 DIAGNOSIS — Z992 Dependence on renal dialysis: Secondary | ICD-10-CM | POA: Diagnosis not present

## 2021-05-08 DIAGNOSIS — N186 End stage renal disease: Secondary | ICD-10-CM | POA: Diagnosis not present

## 2021-05-11 DIAGNOSIS — N186 End stage renal disease: Secondary | ICD-10-CM | POA: Diagnosis not present

## 2021-05-11 DIAGNOSIS — Z992 Dependence on renal dialysis: Secondary | ICD-10-CM | POA: Diagnosis not present

## 2021-05-12 MED ORDER — ORAL CARE MOUTH RINSE: 15.0000 mL | Freq: Once | OROMUCOSAL | Status: AC

## 2021-05-12 MED ORDER — CHLORHEXIDINE GLUCONATE CLOTH 2 % EX PADS
6.0000 | MEDICATED_PAD | Freq: Once | CUTANEOUS | Status: AC
  Administered 2021-05-13: 6 via TOPICAL

## 2021-05-12 MED ORDER — CHLORHEXIDINE GLUCONATE 0.12 % MT SOLN: 15.0000 mL | Freq: Once | OROMUCOSAL | Status: AC

## 2021-05-12 MED ORDER — SODIUM CHLORIDE 0.9 % IV SOLN: INTRAVENOUS | Status: DC

## 2021-05-12 MED ORDER — SODIUM CHLORIDE 0.9 % IV SOLN
1.0000 g | INTRAVENOUS | Status: AC
  Administered 2021-05-13: 1 g via INTRAVENOUS

## 2021-05-12 NOTE — Progress Notes (Signed)
Perioperative Services Pre-Admission/Anesthesia Testing    Date: 05/12/21  Name: Terry Arroyo MRN:   [id number]  Re: Clearance for upcoming vascular procedure   Case: [id number] Date/Time: 05/13/21 1217   Procedure: INSERTION OF ARTERIOVENOUS (AV) GORE-TEX GRAFT ARM ( BRACHIAL AXILLARY ) (Right )   Anesthesia type: General   Pre-op diagnosis: ESRD   Location: [place] [facility] [place]   Surgeons: Katha Cabal, MD    Patient scheduled for the above procedure on 05/13/2021 with Dr. [name].  Of note, patient previously reviewed by PAT APP prior to a vascular procedure on 01/28/2021; see my note dated 01/27/2021.  Interval medical history reviewed:   Patient seen in the ED on 02/09/2021 for evaluation of chest pain and shortness of breath.  Patient presented to the ED via EMS from the dialysis center; did not have treatment.  Given SL NTG and ASA en route.  BP was 200/88 upon arrival.  Patient with acute pulmonary edema in the setting of ESRD; admitted for dialysis.  Patient dialyzed and returned to the ED.  She was discharged home in stable condition on 02/11/2021 with plans to follow-up with her PCP and heart failure clinic.  Patient to continue regular scheduled hemodialysis treatments on Mondays, Wednesdays, and Fridays.   Patient seen in the ED again on 03/20/2021 for angioedema and hoarse voice quality.  Patient brought in to the ED from dialysis center by EMS.  Symptoms reported to be intermittent x 2 days, however worse that morning.  Patient lips were very swollen, eyes were almost swollen shut, and she was having difficulty breathing. Did not complete her hemodialysis treatment that day.  EMS administered 50 mg of oral diphenhydramine; somewhat improved upon arrival.  Had recently been started on omeprazole, however she was also on an ACEi (lisinopril). Treated with steroids and admitted for observation. Dialyzed during her admission.  On the  morning of 03/21/2021, patient complained of nausea, vomiting, and diaphoresis. Blood glucose level was found to be 534 following IV corticosteroid dosing for angioedema reaction; treated with IV insulin.  She became lethargic and had a poor response to sternal rub and rapid response was called to stabilize patient. Changes were made to antidiabetic regimen.  Clinical condition improved and patient discharged home in stable condition 03/22/2021.   Patient followed regularly by the home based patient advocate program through the [facility] heart failure clinic. EMT-P contacts patient on a regular basis regarding her hear failure symptoms and blood glucose management. Efforts of program is to maintain patient optimization to prevent exacerbations of patient's chronic conditions that lead to ED visits and hospital admissions. [place], EMT-P working with patient frequently touches base to assess care needs and social determinants of care, which has been a wonderful and Marine scientist for this patient.   Patient has had no other acute events or changes to her medical history. Previously placed AV graft has been found to not be appropriate for hemodialysis, hence the need for the planned procedural course. Cardiology previously cleared patient for procedure, however given the length of time and recent ED admissions, updated clearance from cardiology provider was sought by the PAT team.  Per cardiology, "this patient is optimized for surgery and may proceed with the planned procedural course with a LOW risk stratification". Patient on daily low dose ASA. She has been cleared to hold her daily low dose ASA if requested by surgery. Per Dr. Nino Parsley protocol, patient may continue ASA throughout the perioperative  period, with plans to hold it only on the day of her procedure.   Updated Pre-Operative  Work Up:   ECG: Date: 05/12/2021 Time ECG obtained: 1352 PM Rate: 58 bpm Rhythm: sinus bradycardia Axis (leads I and  aVF): Normal Intervals: PR 156 ms. QRS 100 ms. QTc 465 ms.  ST segment and T wave changes: Nonspecific lateral ST and T wave abnormalities  Comparison: Similar to previous tracing obtained on 02/09/2021  LABS: Hospital Outpatient Visit on 05/07/2021  Component Date Value Ref Range Status  . WBC 05/07/2021 9.0  4.0 - 10.5 K/uL Final  . RBC 05/07/2021 4.13  3.87 - 5.11 MIL/uL Final  . Hemoglobin 05/07/2021 11.6* 12.0 - 15.0 g/dL Final  . HCT 05/07/2021 34.7* 36.0 - 46.0 % Final  . MCV 05/07/2021 84.0  80.0 - 100.0 fL Final  . MCH 05/07/2021 28.1  26.0 - 34.0 pg Final  . MCHC 05/07/2021 33.4  30.0 - 36.0 g/dL Final  . RDW 05/07/2021 15.0  11.5 - 15.5 % Final  . Platelets 05/07/2021 318  150 - 400 K/uL Final  . nRBC 05/07/2021 0.0  0.0 - 0.2 % Final  . Neutrophils Relative % 05/07/2021 71  % Final  . Neutro Abs 05/07/2021 6.4  1.7 - 7.7 K/uL Final  . Lymphocytes Relative 05/07/2021 17  % Final  . Lymphs Abs 05/07/2021 1.5  0.7 - 4.0 K/uL Final  . Monocytes Relative 05/07/2021 7  % Final  . Monocytes Absolute 05/07/2021 0.7  0.1 - 1.0 K/uL Final  . Eosinophils Relative 05/07/2021 4  % Final  . Eosinophils Absolute 05/07/2021 0.4  0.0 - 0.5 K/uL Final  . Basophils Relative 05/07/2021 1  % Final  . Basophils Absolute 05/07/2021 0.1  0.0 - 0.1 K/uL Final  . Immature Granulocytes 05/07/2021 0  % Final  . Abs Immature Granulocytes 05/07/2021 0.02  0.00 - 0.07 K/uL Final   Performed at [facility], [address] [place], [place] [address]  . Sodium 05/07/2021 133* 135 - 145 mmol/L Final  . Potassium 05/07/2021 3.2* 3.5 - 5.1 mmol/L Final  . Chloride 05/07/2021 97* 98 - 111 mmol/L Final  . CO2 05/07/2021 23  22 - 32 mmol/L Final  . Glucose, Bld 05/07/2021 415* 70 - 99 mg/dL Final   Glucose reference range applies only to samples taken after fasting for at least 8 hours.  . BUN 05/07/2021 28* 6 - 20 mg/dL Final  . Creatinine, Ser 05/07/2021 4.53* 0.44 - 1.00 mg/dL Final  .  Calcium 05/07/2021 8.8* 8.9 - 10.3 mg/dL Final  . GFR, Estimated 05/07/2021 11* >60 mL/min Final   Comment: (NOTE) Calculated using the CKD-EPI Creatinine Equation (2021)   . Anion gap 05/07/2021 13  5 - 15 Final   Performed at [facility], [address] [place], [place] [address]  . Prothrombin Time 05/07/2021 13.0  11.4 - 15.2 seconds Final  . INR 05/07/2021 1.0  0.8 - 1.2 Final   Comment: (NOTE) INR goal varies based on device and disease states. Performed at [facility], [address] [place], [place] [address]   . aPTT 05/07/2021 34  24 - 36 seconds Final   Performed at [facility], [address] [place],  [address]  . ABO/RH(D) 05/07/2021 AB POS   Final  . Antibody Screen 05/07/2021 NEG   Final  . Sample Expiration 05/07/2021 05/21/2021,2359   Final  . Extend sample reason 05/07/2021    Final  Value:NO TRANSFUSIONS OR PREGNANCY IN THE PAST 3 MONTHS Performed at [facility], [address] [place], [place] [address]    Impression and Plan:  Terry Arroyo has been referred for pre-anesthesia review and clearance prior to her undergoing the planned anesthetic and procedural courses. Available labs, pertinent testing, and imaging results were personally reviewed by me. This patient has been appropriately cleared by cardiology with an overall LOW risk of significant perioperative cardiovascular complications.  Based on clinical review performed today (05/12/21), barring any significant acute changes in the patient's overall condition, it is anticipated that she will be able to proceed with the planned surgical intervention. Any acute changes in clinical condition may necessitate her procedure being postponed and/or cancelled. Patient will meet with anesthesia team (MD and/or CRNA) on this day of her procedure for preoperative evaluation/assessment.   Pre-surgical instructions were reviewed with the patient  during her PAT appointment and questions were fielded by PAT clinical staff. Patient was advised that if any questions or concerns arise prior to her procedure then she should return a call to PAT and/or her surgeon's office to discuss.  Honor Loh, MSN, APRN, FNP-C, CEN [facility]  Peri-operative Services Nurse Practitioner Phone: [phone] 05/12/21 8:46 AM  NOTE: This note has been prepared using Dragon dictation software. Despite my best ability to proofread, there is always the potential that unintentional transcriptional errors may still occur from this process.

## 2021-05-13 ENCOUNTER — Encounter: Payer: Self-pay | Admitting: Vascular Surgery

## 2021-05-13 ENCOUNTER — Other Ambulatory Visit: Payer: Self-pay

## 2021-05-13 ENCOUNTER — Ambulatory Visit
Admission: RE | Admit: 2021-05-13 | Discharge: 2021-05-13 | Disposition: A | Discharge status: Home / Self Care | Payer: Medicare HMO | Attending: Vascular Surgery | Admitting: Vascular Surgery

## 2021-05-13 ENCOUNTER — Inpatient Hospital Stay: Payer: Medicare HMO | Admitting: Urgent Care

## 2021-05-13 ENCOUNTER — Encounter
Admission: RE | Disposition: A | Discharge status: Home / Self Care | Payer: Self-pay | Source: Home / Self Care | Attending: Vascular Surgery

## 2021-05-13 DIAGNOSIS — Z992 Dependence on renal dialysis: Secondary | ICD-10-CM | POA: Diagnosis not present

## 2021-05-13 DIAGNOSIS — E1122 Type 2 diabetes mellitus with diabetic chronic kidney disease: Secondary | ICD-10-CM | POA: Insufficient documentation

## 2021-05-13 DIAGNOSIS — Z888 Allergy status to other drugs, medicaments and biological substances status: Secondary | ICD-10-CM | POA: Insufficient documentation

## 2021-05-13 DIAGNOSIS — Z87891 Personal history of nicotine dependence: Secondary | ICD-10-CM | POA: Diagnosis not present

## 2021-05-13 DIAGNOSIS — N186 End stage renal disease: Secondary | ICD-10-CM | POA: Insufficient documentation

## 2021-05-13 DIAGNOSIS — Z794 Long term (current) use of insulin: Secondary | ICD-10-CM | POA: Diagnosis not present

## 2021-05-13 DIAGNOSIS — Z8616 Personal history of COVID-19: Secondary | ICD-10-CM | POA: Insufficient documentation

## 2021-05-13 DIAGNOSIS — N185 Chronic kidney disease, stage 5: Secondary | ICD-10-CM | POA: Diagnosis not present

## 2021-05-13 DIAGNOSIS — E785 Hyperlipidemia, unspecified: Secondary | ICD-10-CM | POA: Diagnosis not present

## 2021-05-13 DIAGNOSIS — Z79899 Other long term (current) drug therapy: Secondary | ICD-10-CM | POA: Diagnosis not present

## 2021-05-13 DIAGNOSIS — J449 Chronic obstructive pulmonary disease, unspecified: Secondary | ICD-10-CM | POA: Diagnosis not present

## 2021-05-13 DIAGNOSIS — I132 Hypertensive heart and chronic kidney disease with heart failure and with stage 5 chronic kidney disease, or end stage renal disease: Secondary | ICD-10-CM | POA: Diagnosis not present

## 2021-05-13 DIAGNOSIS — Z7982 Long term (current) use of aspirin: Secondary | ICD-10-CM | POA: Diagnosis not present

## 2021-05-13 DIAGNOSIS — I509 Heart failure, unspecified: Secondary | ICD-10-CM | POA: Diagnosis not present

## 2021-05-13 DIAGNOSIS — Z87892 Personal history of anaphylaxis: Secondary | ICD-10-CM | POA: Insufficient documentation

## 2021-05-13 DIAGNOSIS — D631 Anemia in chronic kidney disease: Secondary | ICD-10-CM | POA: Diagnosis not present

## 2021-05-13 DIAGNOSIS — I5020 Unspecified systolic (congestive) heart failure: Secondary | ICD-10-CM | POA: Insufficient documentation

## 2021-05-13 HISTORY — PX: AV FISTULA PLACEMENT: SHX1204

## 2021-05-13 LAB — POCT I-STAT, CHEM 8
BUN: 35 mg/dL — ABNORMAL HIGH (ref 6–20)
Calcium, Ion: 1.09 mmol/L — ABNORMAL LOW (ref 1.15–1.40)
Chloride: 99 mmol/L (ref 98–111)
Creatinine, Ser: 6 mg/dL — ABNORMAL HIGH (ref 0.44–1.00)
Glucose, Bld: 271 mg/dL — ABNORMAL HIGH (ref 70–99)
HCT: 35 % — ABNORMAL LOW (ref 36.0–46.0)
Hemoglobin: 11.9 g/dL — ABNORMAL LOW (ref 12.0–15.0)
Potassium: 3.3 mmol/L — ABNORMAL LOW (ref 3.5–5.1)
Sodium: 135 mmol/L (ref 135–145)
TCO2: 24 mmol/L (ref 22–32)

## 2021-05-13 LAB — GLUCOSE, CAPILLARY
Glucose-Capillary: 263 mg/dL — ABNORMAL HIGH (ref 70–99)
Glucose-Capillary: 185 mg/dL — ABNORMAL HIGH (ref 70–99)

## 2021-05-13 SURGERY — INSERTION OF ARTERIOVENOUS (AV) GORE-TEX GRAFT ARM
Anesthesia: General | Site: Arm Upper | Laterality: Right

## 2021-05-13 MED ORDER — FENTANYL CITRATE (PF) 100 MCG/2ML IJ SOLN
INTRAMUSCULAR | Status: DC | PRN
  Administered 2021-05-13: 25 ug via INTRAVENOUS
  Administered 2021-05-13: 50 ug via INTRAVENOUS
  Administered 2021-05-13: 25 ug via INTRAVENOUS

## 2021-05-13 MED ORDER — SODIUM CHLORIDE 0.9 % IV SOLN
INTRAVENOUS | Status: DC | PRN
  Administered 2021-05-13: 501 mL via INTRAMUSCULAR

## 2021-05-13 MED ORDER — SODIUM CHLORIDE 0.9 % IV SOLN
INTRAVENOUS | Status: AC
  Filled 2021-05-13: qty 10

## 2021-05-13 MED ORDER — MIDAZOLAM HCL 2 MG/2ML IJ SOLN
INTRAMUSCULAR | Status: AC
  Filled 2021-05-13: qty 2

## 2021-05-13 MED ORDER — FENTANYL CITRATE (PF) 100 MCG/2ML IJ SOLN: 25.0000 ug | INTRAMUSCULAR | Status: DC | PRN

## 2021-05-13 MED ORDER — MEPERIDINE HCL 25 MG/ML IJ SOLN: 6.2500 mg | INTRAMUSCULAR | Status: DC | PRN

## 2021-05-13 MED ORDER — PROPOFOL 10 MG/ML IV BOLUS
INTRAVENOUS | Status: AC
  Filled 2021-05-13: qty 20

## 2021-05-13 MED ORDER — EPHEDRINE SULFATE 50 MG/ML IJ SOLN
INTRAMUSCULAR | Status: DC | PRN
  Administered 2021-05-13: 10 mg via INTRAVENOUS
  Administered 2021-05-13: 2.5 mg via INTRAVENOUS

## 2021-05-13 MED ORDER — PROPOFOL 10 MG/ML IV BOLUS
INTRAVENOUS | Status: DC | PRN
  Administered 2021-05-13: 150 mg via INTRAVENOUS

## 2021-05-13 MED ORDER — ONDANSETRON HCL 4 MG/2ML IJ SOLN: 4.0000 mg | Freq: Once | INTRAMUSCULAR | Status: DC | PRN

## 2021-05-13 MED ORDER — LACTATED RINGERS IV SOLN: INTRAVENOUS | Status: DC | PRN

## 2021-05-13 MED ORDER — INSULIN ASPART 100 UNIT/ML IJ SOLN
INTRAMUSCULAR | Status: AC
  Filled 2021-05-13: qty 1

## 2021-05-13 MED ORDER — LIDOCAINE HCL (CARDIAC) PF 100 MG/5ML IV SOSY
PREFILLED_SYRINGE | INTRAVENOUS | Status: DC | PRN
  Administered 2021-05-13: 60 mg via INTRAVENOUS

## 2021-05-13 MED ORDER — ONDANSETRON HCL 4 MG/2ML IJ SOLN
INTRAMUSCULAR | Status: DC | PRN
  Administered 2021-05-13: 4 mg via INTRAVENOUS

## 2021-05-13 MED ORDER — CHLORHEXIDINE GLUCONATE 0.12 % MT SOLN
OROMUCOSAL | Status: AC
  Administered 2021-05-13: 15 mL via OROMUCOSAL
  Filled 2021-05-13: qty 15

## 2021-05-13 MED ORDER — MIDAZOLAM HCL 2 MG/2ML IJ SOLN
INTRAMUSCULAR | Status: DC | PRN
  Administered 2021-05-13: 1 mg via INTRAVENOUS

## 2021-05-13 MED ORDER — PHENYLEPHRINE HCL (PRESSORS) 10 MG/ML IV SOLN
INTRAVENOUS | Status: DC | PRN
  Administered 2021-05-13: 100 ug via INTRAVENOUS

## 2021-05-13 MED ORDER — INSULIN ASPART 100 UNIT/ML IJ SOLN
6.0000 [IU] | Freq: Once | INTRAMUSCULAR | Status: AC
  Administered 2021-05-13: 6 [IU] via SUBCUTANEOUS

## 2021-05-13 MED ORDER — HYDROCODONE-ACETAMINOPHEN 5-325 MG PO TABS: 1.0000 | ORAL_TABLET | Freq: Four times a day (QID) | ORAL | 0 refills | Status: DC | PRN

## 2021-05-13 MED ORDER — FENTANYL CITRATE (PF) 100 MCG/2ML IJ SOLN
INTRAMUSCULAR | Status: AC
  Filled 2021-05-13: qty 2

## 2021-05-13 MED ORDER — HEPARIN SODIUM (PORCINE) 5000 UNIT/ML IJ SOLN
INTRAMUSCULAR | Status: AC
  Filled 2021-05-13: qty 1

## 2021-05-13 MED ORDER — PROPOFOL 500 MG/50ML IV EMUL
INTRAVENOUS | Status: AC
  Filled 2021-05-13: qty 50

## 2021-05-13 MED ORDER — LIDOCAINE HCL (PF) 2 % IJ SOLN
INTRAMUSCULAR | Status: AC
  Filled 2021-05-13: qty 4

## 2021-05-13 MED ORDER — BUPIVACAINE LIPOSOME 1.3 % IJ SUSP
INTRAMUSCULAR | Status: DC | PRN
  Administered 2021-05-13: 20 mL

## 2021-05-13 MED ORDER — BUPIVACAINE LIPOSOME 1.3 % IJ SUSP
INTRAMUSCULAR | Status: AC
  Filled 2021-05-13: qty 20

## 2021-05-13 MED ORDER — SEVOFLURANE IN SOLN
RESPIRATORY_TRACT | Status: AC
  Filled 2021-05-13: qty 250

## 2021-05-13 MED ORDER — BUPIVACAINE HCL (PF) 0.5 % IJ SOLN
INTRAMUSCULAR | Status: DC | PRN
  Administered 2021-05-13: 30 mL

## 2021-05-13 MED ORDER — BUPIVACAINE HCL (PF) 0.5 % IJ SOLN
INTRAMUSCULAR | Status: AC
  Filled 2021-05-13: qty 30

## 2021-05-13 SURGICAL SUPPLY — 66 items
ADH SKN CLS APL DERMABOND .7 (GAUZE/BANDAGES/DRESSINGS) ×1
APL PRP STRL LF DISP 70% ISPRP (MISCELLANEOUS) ×1
APPLIER CLIP 11 MED OPEN (CLIP)
APPLIER CLIP 9.375 SM OPEN (CLIP)
APR CLP MED 11 20 MLT OPN (CLIP)
APR CLP SM 9.3 20 MLT OPN (CLIP)
BAG COUNTER SPONGE EZ (MISCELLANEOUS) ×2 IMPLANT
BAG DECANTER FOR FLEXI CONT (MISCELLANEOUS) ×2 IMPLANT
BAG SPNG 4X4 CLR HAZ (MISCELLANEOUS) ×1
BLADE SURG SZ11 CARB STEEL (BLADE) ×2 IMPLANT
BOOT SUTURE AID YELLOW STND (SUTURE) ×2 IMPLANT
BRUSH SCRUB EZ  4% CHG (MISCELLANEOUS) ×1
BRUSH SCRUB EZ 4% CHG (MISCELLANEOUS) ×1 IMPLANT
CANISTER SUCT 1200ML W/VALVE (MISCELLANEOUS) ×2 IMPLANT
CHLORAPREP W/TINT 26 (MISCELLANEOUS) ×2 IMPLANT
CLIP APPLIE 11 MED OPEN (CLIP) IMPLANT
CLIP APPLIE 9.375 SM OPEN (CLIP) IMPLANT
COVER WAND RF STERILE (DRAPES) ×2 IMPLANT
DERMABOND ADVANCED (GAUZE/BANDAGES/DRESSINGS) ×1
DERMABOND ADVANCED .7 DNX12 (GAUZE/BANDAGES/DRESSINGS) ×1 IMPLANT
DRESSING SURGICEL FIBRLLR 1X2 (HEMOSTASIS) ×1 IMPLANT
DRSG SURGICEL FIBRILLAR 1X2 (HEMOSTASIS) ×2
ELECT CAUTERY BLADE 6.4 (BLADE) ×2 IMPLANT
ELECT REM PT RETURN 9FT ADLT (ELECTROSURGICAL) ×2
ELECTRODE REM PT RTRN 9FT ADLT (ELECTROSURGICAL) ×1 IMPLANT
GLOVE SURG ENC MOIS LTX SZ7 (GLOVE) ×2 IMPLANT
GLOVE SURG SYN 8.0 (GLOVE) ×2 IMPLANT
GLOVE SURG SYN 8.0 PF PI (GLOVE) ×1 IMPLANT
GLOVE SURG UNDER LTX SZ7.5 (GLOVE) ×2 IMPLANT
GOWN STRL REUS W/ TWL LRG LVL3 (GOWN DISPOSABLE) ×1 IMPLANT
GOWN STRL REUS W/ TWL XL LVL3 (GOWN DISPOSABLE) ×2 IMPLANT
GOWN STRL REUS W/TWL LRG LVL3 (GOWN DISPOSABLE) ×2
GOWN STRL REUS W/TWL XL LVL3 (GOWN DISPOSABLE) ×4
GRAFT PROPATEN STD WALL 4 7X45 (Vascular Products) ×2 IMPLANT
IV NS 500ML (IV SOLUTION) ×2
IV NS 500ML BAXH (IV SOLUTION) ×1 IMPLANT
KIT TURNOVER KIT A (KITS) ×2 IMPLANT
LABEL OR SOLS (LABEL) ×2 IMPLANT
LOOP RED MAXI  1X406MM (MISCELLANEOUS) ×1
LOOP VESSEL MAXI 1X406 RED (MISCELLANEOUS) ×1 IMPLANT
LOOP VESSEL MINI 0.8X406 BLUE (MISCELLANEOUS) ×2 IMPLANT
LOOPS BLUE MINI 0.8X406MM (MISCELLANEOUS) ×2
MANIFOLD NEPTUNE II (INSTRUMENTS) ×2 IMPLANT
NDL FILTER BLUNT 18X1 1/2 (NEEDLE) ×1 IMPLANT
NEEDLE FILTER BLUNT 18X 1/2SAF (NEEDLE) ×1
NEEDLE FILTER BLUNT 18X1 1/2 (NEEDLE) ×1 IMPLANT
NS IRRIG 500ML POUR BTL (IV SOLUTION) ×2 IMPLANT
PACK EXTREMITY ARMC (MISCELLANEOUS) ×2 IMPLANT
PAD PREP 24X41 OB/GYN DISP (PERSONAL CARE ITEMS) ×2 IMPLANT
STOCKINETTE 48X4 2 PLY STRL (GAUZE/BANDAGES/DRESSINGS) ×1 IMPLANT
STOCKINETTE STRL 4IN 9604848 (GAUZE/BANDAGES/DRESSINGS) ×2 IMPLANT
SUT GTX CV-6 30 (SUTURE) ×4 IMPLANT
SUT MNCRL+ 5-0 UNDYED PC-3 (SUTURE) ×1 IMPLANT
SUT MONOCRYL 5-0 (SUTURE) ×1
SUT PROLENE 6 0 BV (SUTURE) ×6 IMPLANT
SUT SILK 2 0 (SUTURE) ×2
SUT SILK 2 0 SH (SUTURE) ×2 IMPLANT
SUT SILK 2-0 18XBRD TIE 12 (SUTURE) ×1 IMPLANT
SUT SILK 3 0 (SUTURE) ×2
SUT SILK 3-0 18XBRD TIE 12 (SUTURE) ×1 IMPLANT
SUT SILK 4 0 (SUTURE) ×2
SUT SILK 4-0 18XBRD TIE 12 (SUTURE) ×1 IMPLANT
SUT VIC AB 3-0 SH 27 (SUTURE) ×4
SUT VIC AB 3-0 SH 27X BRD (SUTURE) ×2 IMPLANT
SYR 20ML LL LF (SYRINGE) ×2 IMPLANT
SYR 3ML LL SCALE MARK (SYRINGE) ×2 IMPLANT

## 2021-05-13 NOTE — Interval H&P Note (Signed)
History and Physical Interval Note:  05/13/2021 4:21 PM  Terry Arroyo  has presented today for surgery, with the diagnosis of ESRD.  The various methods of treatment have been discussed with the patient and family. After consideration of risks, benefits and other options for treatment, the patient has consented to  Procedure(s): INSERTION OF ARTERIOVENOUS (AV) GORE-TEX GRAFT ARM ( BRACHIAL AXILLARY ) (Right) as a surgical intervention.  The patient's history has been reviewed, patient examined, no change in status, stable for surgery.  I have reviewed the patient's chart and labs.  Questions were answered to the patient's satisfaction.     [name]

## 2021-05-13 NOTE — Anesthesia Postprocedure Evaluation (Signed)
Anesthesia Post Note  Patient: Terry Arroyo  Procedure(s) Performed: INSERTION OF ARTERIOVENOUS (AV) GORE-TEX GRAFT ARM ( BRACHIAL AXILLARY ) (Right Arm Upper)  Patient location during evaluation: PACU Anesthesia Type: General Level of consciousness: awake and alert, awake and oriented Pain management: pain level controlled Vital Signs Assessment: post-procedure vital signs reviewed and stable Respiratory status: spontaneous breathing, nonlabored ventilation and respiratory function stable Cardiovascular status: blood pressure returned to baseline and stable Postop Assessment: no apparent nausea or vomiting Anesthetic complications: no   No complications documented.   Last Vitals:  Vitals:   05/13/21 1448 05/13/21 1453  BP:  139/61  Pulse:  64  Resp: 12 14  Temp:  (!) 36.2 C  SpO2:  96%    Last Pain:  Vitals:   05/13/21 1453  TempSrc: Temporal  PainSc: 0-No pain                 Phill Mutter

## 2021-05-13 NOTE — Transfer of Care (Signed)
Immediate Anesthesia Transfer of Care Note  Patient: Terry Arroyo  Procedure(s) Performed: INSERTION OF ARTERIOVENOUS (AV) GORE-TEX GRAFT ARM ( BRACHIAL AXILLARY ) (Right Arm Upper)  Patient Location: PACU  Anesthesia Type:General  Level of Consciousness: awake and alert   Airway & Oxygen Therapy: Patient Spontanous Breathing and Patient connected to nasal cannula oxygen  Post-op Assessment: Report given to RN and Post -op Vital signs reviewed and stable  Post vital signs: Reviewed and stable  Last Vitals:  Vitals Value Taken Time  BP 141/67 05/13/21 1418  Temp 36.5 C 05/13/21 1418  Pulse 62 05/13/21 1419  Resp 12 05/13/21 1419  SpO2 100 % 05/13/21 1419  Vitals shown include unvalidated device data.  Last Pain:  Vitals:   05/13/21 1041  TempSrc: Temporal  PainSc: 0-No pain         Complications: No complications documented.

## 2021-05-13 NOTE — H&P (View-Only) (Signed)
The patient's family was not present in the waiting area after surgery.  I was requested to call.  Contact in the computer is the patient's father.  The number was his cell phone which unfortunately went straight to voicemail but the mailbox was full.

## 2021-05-13 NOTE — Anesthesia Procedure Notes (Signed)
Procedure Name: LMA Insertion Date/Time: 05/13/2021 11:43 AM Performed by: Fredderick Phenix, CRNA Pre-anesthesia Checklist: Patient identified, Emergency Drugs available, Suction available and Patient being monitored Patient Re-evaluated:Patient Re-evaluated prior to induction Oxygen Delivery Method: Circle system utilized Preoxygenation: Pre-oxygenation with 100% oxygen Induction Type: IV induction Ventilation: Mask ventilation without difficulty LMA Size: 3.5 Number of attempts: 1 Tube secured with: Tape Dental Injury: Teeth and Oropharynx as per pre-operative assessment

## 2021-05-13 NOTE — Anesthesia Preprocedure Evaluation (Signed)
Anesthesia Evaluation  Patient identified by MRN, date of birth, ID band Patient awake    Reviewed: Allergy & Precautions, H&P , NPO status , Patient's Chart, lab work & pertinent test results, reviewed documented beta blocker date and time   Airway Mallampati: II  TM Distance: >3 FB Neck ROM: full    Dental  (+) Upper Dentures, Lower Dentures   Pulmonary asthma , sleep apnea , pneumonia, resolved, COPD, former smoker,    Pulmonary exam normal        Cardiovascular Exercise Tolerance: Poor hypertension, On Medications, Pt. on home beta blockers and Pt. on medications + angina with exertion + CAD, + Past MI, + Peripheral Vascular Disease, +CHF and + DOE  Normal cardiovascular exam Rate:Normal     Neuro/Psych  Headaches, PSYCHIATRIC DISORDERS Anxiety Depression  Neuromuscular disease CVA    GI/Hepatic Neg liver ROS, GERD  Medicated,  Endo/Other  negative endocrine ROSdiabetes, Poorly Controlled, Type 1, Insulin Dependent  Renal/GU ESRF and DialysisRenal disease  negative genitourinary   Musculoskeletal   Abdominal   Peds  Hematology  (+) Blood dyscrasia, anemia ,   Anesthesia Other Findings Anemia    Angina at rest [facility])    Angioedema 03/20/2021 Lisinopril  Anterior cerebral aneurysm  approximately 5 mm; left MCA   Anxiety    Asthma    Blind left eye    COPD (chronic obstructive pulmonary disease) (HCC)    Coronary artery disease    Depression    Diabetes mellitus without complication (HCC)    ESRD (end stage renal disease) ([place])    GERD (gastroesophageal reflux disease)    HFrEF (heart failure with reduced ejection fraction) ([place])    History of 2019 novel coronavirus disease (COVID-19) 03/2019 [place] [facility] [place]  History of DVT (deep vein thrombosis)    Hx of CABG    Hyperlipidemia    Hypertension    MI (myocardial infarction) ([place]) 2018   Pituitary adenoma ([place])    Sleep  apnea  non-compliant with nocturnal PAP therapy  Stroke [facility]) 2014 stated affected left side.     Reproductive/Obstetrics negative OB ROS                             Anesthesia Physical  Anesthesia Plan  ASA: IV  Anesthesia Plan: General LMA   Post-op Pain Management:    Induction:   PONV Risk Score and Plan: 4 or greater and Propofol infusion and Midazolam  Airway Management Planned: LMA  Additional Equipment:   Intra-op Plan:   Post-operative Plan:   Informed Consent: I have reviewed the patients History and Physical, chart, labs and discussed the procedure including the risks, benefits and alternatives for the proposed anesthesia with the patient or authorized representative who has indicated his/her understanding and acceptance.       Plan Discussed with: CRNA, Anesthesiologist and Surgeon  Anesthesia Plan Comments:         Anesthesia Quick Evaluation

## 2021-05-13 NOTE — Op Note (Signed)
OPERATIVE NOTE   PROCEDURE: right brachial axillary arteriovenous graft placement  PRE-OPERATIVE DIAGNOSIS: End Stage Renal Disease  POST-OPERATIVE DIAGNOSIS: End Stage Renal Disease  SURGEON: [name]  ASSISTANT(S): None  ANESTHESIA: general  ESTIMATED BLOOD LOSS: <50 cc  FINDING(S): 4 mm brachial artery with a 8 to 10 mm axillary vein  SPECIMEN(S):  none  INDICATIONS:   Terry Arroyo is a 60 y.o. female who presents with end stage renal disease.  The patient is scheduled for right brachial axillary AV graft placement.  The patient is aware the risks include but are not limited to: bleeding, infection, steal syndrome, nerve damage, ischemic monomelic neuropathy, failure to mature, and need for additional procedures.  The patient is aware of the risks of the procedure and elects to proceed forward.  DESCRIPTION: After full informed written consent was obtained from the patient, the patient was brought back to the operating room and placed supine upon the operating table.  Prior to induction, the patient received IV antibiotics.   After obtaining adequate anesthesia, the patient was then prepped and draped in the standard fashion for a right arm access procedure.   A first assistant was required to provide a safe and appropriate environment for executing the surgery.  The assistant was integral in providing retraction, exposure, running suture providing suction and in the closing process.   A linear incision was then created along the medial border of the biceps muscle just proximal to the antecubital crease and the brachial artery which was exposed through. The brachial artery was then looped proximally and distally with Silastic Vesseloops. Side branches were controlled with 4-0 silk ties.  Attention was then turned to the exposure of the axillary vein. Linear incision was then created medial to the proximal portion of the biceps at the level of the anterior  axillary crease. The axillary vein was exposed and again looped proximally and distally with Silastic vessel loops. Associated tributaries were also controlled with Silastic Vesseloops.  The Gore tunneler was then delivered onto the field and a subcutaneous path was made from the arterial incision to the venous incision. A 4-7 tapered PTFE propatent graft by Simeon Craft was then pulled through the subcutaneous tunnel. The arterial 4 mm portion was then approximated to the brachial artery. Brachial artery was controlled proximally and distally with the Silastic Vesseloops. Arteriotomy was made with an 11 blade scalpel and extended with Potts scissors and a 6-0 Prolene stay suture was placed. End graft to side brachial artery anastomosis was then fashioned with running CV 6 suture. Flushing maneuvers were performed suture line was hemostatic and the graft was then assessed for proper position and ease of future cannulation. Heparinized saline was infused into the vein and the graft was clamped with a vascular clamp. With the graft pressurized it was approximated to the axillary vein in its native bed and then marked with a surgical marker. The vein was then delivered into the surgical field and controlled with the Silastic vessel loops. Venotomy was then made with an 11 blade scalpel and extended with Potts scissors and a 6-0 Prolene suture was used as stay suture. The the graft was then sewn to the vein in an end graft to side vein fashion using running CV 6 suture.  Flushing maneuvers were performed and the artery was allowed to forward and back bleed.  Flow was then established through the AV graft  There was good  thrill in the venous outflow, and there was 1+  palpable radial pulse.  At this point, I irrigated out the surgical wounds.  There was no further active bleeding.  The subcutaneous tissue was reapproximated with a running stitch of 3-0 Vicryl.  The skin was then reapproximated with a running subcuticular  stitch of 4-0 Vicryl.  The skin was then cleaned, dried, and reinforced with Dermabond.    The patient tolerated this procedure well.   COMPLICATIONS: None  CONDITION: Terry Arroyo [facility]  Office: [phone]   05/13/2021, 2:34 PM

## 2021-05-13 NOTE — Discharge Instructions (Signed)

## 2021-05-13 NOTE — Progress Notes (Signed)
The patient's family was not present in the waiting area after surgery.  I was requested to call.  Contact in the computer is the patient's father.  The number was his cell phone which unfortunately went straight to voicemail but the mailbox was full.

## 2021-05-13 NOTE — Interval H&P Note (Signed)
History and Physical Interval Note:  05/13/2021 10:55 AM  Terry Arroyo  has presented today for surgery, with the diagnosis of ESRD.  The various methods of treatment have been discussed with the patient and family. After consideration of risks, benefits and other options for treatment, the patient has consented to  Procedure(s): INSERTION OF ARTERIOVENOUS (AV) GORE-TEX GRAFT ARM ( BRACHIAL AXILLARY ) (Right) as a surgical intervention.  The patient's history has been reviewed, patient examined, no change in status, stable for surgery.  I have reviewed the patient's chart and labs.  Questions were answered to the patient's satisfaction.     [name]

## 2021-05-14 ENCOUNTER — Encounter: Payer: Self-pay | Admitting: Vascular Surgery

## 2021-05-14 ENCOUNTER — Telehealth (INDEPENDENT_AMBULATORY_CARE_PROVIDER_SITE_OTHER): Payer: Self-pay

## 2021-05-14 DIAGNOSIS — Z992 Dependence on renal dialysis: Secondary | ICD-10-CM | POA: Diagnosis not present

## 2021-05-14 DIAGNOSIS — N186 End stage renal disease: Secondary | ICD-10-CM | POA: Diagnosis not present

## 2021-05-14 NOTE — Telephone Encounter (Signed)
Patient left a message stating her right hand is cold and wanting to know if its normal or not. I attempted to contact the patient and a message was left for a return call. Patient had a right upper arm AVG placed on 05/13/21 with Dr. Delana Meyer.

## 2021-05-15 DIAGNOSIS — N186 End stage renal disease: Secondary | ICD-10-CM | POA: Diagnosis not present

## 2021-05-15 DIAGNOSIS — Z992 Dependence on renal dialysis: Secondary | ICD-10-CM | POA: Diagnosis not present

## 2021-05-19 DIAGNOSIS — N186 End stage renal disease: Secondary | ICD-10-CM | POA: Diagnosis not present

## 2021-05-19 DIAGNOSIS — Z992 Dependence on renal dialysis: Secondary | ICD-10-CM | POA: Diagnosis not present

## 2021-05-20 DIAGNOSIS — N186 End stage renal disease: Secondary | ICD-10-CM | POA: Diagnosis not present

## 2021-05-20 DIAGNOSIS — Z992 Dependence on renal dialysis: Secondary | ICD-10-CM | POA: Diagnosis not present

## 2021-05-22 DIAGNOSIS — N186 End stage renal disease: Secondary | ICD-10-CM | POA: Diagnosis not present

## 2021-05-22 DIAGNOSIS — Z992 Dependence on renal dialysis: Secondary | ICD-10-CM | POA: Diagnosis not present

## 2021-05-25 DIAGNOSIS — N186 End stage renal disease: Secondary | ICD-10-CM | POA: Diagnosis not present

## 2021-05-25 DIAGNOSIS — Z992 Dependence on renal dialysis: Secondary | ICD-10-CM | POA: Diagnosis not present

## 2021-05-26 DIAGNOSIS — N186 End stage renal disease: Secondary | ICD-10-CM | POA: Diagnosis not present

## 2021-05-26 DIAGNOSIS — E1122 Type 2 diabetes mellitus with diabetic chronic kidney disease: Secondary | ICD-10-CM | POA: Diagnosis not present

## 2021-05-26 DIAGNOSIS — Z992 Dependence on renal dialysis: Secondary | ICD-10-CM | POA: Diagnosis not present

## 2021-05-26 DIAGNOSIS — Z794 Long term (current) use of insulin: Secondary | ICD-10-CM | POA: Diagnosis not present

## 2021-05-27 DIAGNOSIS — Z992 Dependence on renal dialysis: Secondary | ICD-10-CM | POA: Diagnosis not present

## 2021-05-27 DIAGNOSIS — N186 End stage renal disease: Secondary | ICD-10-CM | POA: Diagnosis not present

## 2021-05-29 DIAGNOSIS — Z992 Dependence on renal dialysis: Secondary | ICD-10-CM | POA: Diagnosis not present

## 2021-05-29 DIAGNOSIS — N186 End stage renal disease: Secondary | ICD-10-CM | POA: Diagnosis not present

## 2021-06-01 DIAGNOSIS — Z992 Dependence on renal dialysis: Secondary | ICD-10-CM | POA: Diagnosis not present

## 2021-06-01 DIAGNOSIS — N186 End stage renal disease: Secondary | ICD-10-CM | POA: Diagnosis not present

## 2021-06-03 ENCOUNTER — Other Ambulatory Visit (INDEPENDENT_AMBULATORY_CARE_PROVIDER_SITE_OTHER): Payer: Self-pay | Admitting: Vascular Surgery

## 2021-06-03 DIAGNOSIS — Z95828 Presence of other vascular implants and grafts: Secondary | ICD-10-CM

## 2021-06-03 DIAGNOSIS — Z992 Dependence on renal dialysis: Secondary | ICD-10-CM | POA: Diagnosis not present

## 2021-06-03 DIAGNOSIS — N186 End stage renal disease: Secondary | ICD-10-CM | POA: Diagnosis not present

## 2021-06-03 DIAGNOSIS — R630 Anorexia: Secondary | ICD-10-CM | POA: Diagnosis not present

## 2021-06-03 DIAGNOSIS — Z20822 Contact with and (suspected) exposure to covid-19: Secondary | ICD-10-CM | POA: Diagnosis not present

## 2021-06-03 DIAGNOSIS — R197 Diarrhea, unspecified: Secondary | ICD-10-CM | POA: Diagnosis not present

## 2021-06-03 DIAGNOSIS — R0602 Shortness of breath: Secondary | ICD-10-CM | POA: Diagnosis not present

## 2021-06-04 ENCOUNTER — Ambulatory Visit (INDEPENDENT_AMBULATORY_CARE_PROVIDER_SITE_OTHER): Payer: Medicare HMO | Admitting: Nurse Practitioner

## 2021-06-04 ENCOUNTER — Ambulatory Visit (INDEPENDENT_AMBULATORY_CARE_PROVIDER_SITE_OTHER): Payer: Medicare HMO

## 2021-06-04 ENCOUNTER — Other Ambulatory Visit: Payer: Self-pay

## 2021-06-04 VITALS — BP 146/72 | HR 67 | Ht 66.0 in | Wt 172.0 lb

## 2021-06-04 DIAGNOSIS — N186 End stage renal disease: Secondary | ICD-10-CM

## 2021-06-04 DIAGNOSIS — I152 Hypertension secondary to endocrine disorders: Secondary | ICD-10-CM

## 2021-06-04 DIAGNOSIS — E1169 Type 2 diabetes mellitus with other specified complication: Secondary | ICD-10-CM

## 2021-06-04 DIAGNOSIS — Z95828 Presence of other vascular implants and grafts: Secondary | ICD-10-CM | POA: Diagnosis not present

## 2021-06-04 DIAGNOSIS — E785 Hyperlipidemia, unspecified: Secondary | ICD-10-CM

## 2021-06-04 DIAGNOSIS — E1159 Type 2 diabetes mellitus with other circulatory complications: Secondary | ICD-10-CM

## 2021-06-05 DIAGNOSIS — N186 End stage renal disease: Secondary | ICD-10-CM | POA: Diagnosis not present

## 2021-06-05 DIAGNOSIS — Z992 Dependence on renal dialysis: Secondary | ICD-10-CM | POA: Diagnosis not present

## 2021-06-08 DIAGNOSIS — Z992 Dependence on renal dialysis: Secondary | ICD-10-CM | POA: Diagnosis not present

## 2021-06-08 DIAGNOSIS — N186 End stage renal disease: Secondary | ICD-10-CM | POA: Diagnosis not present

## 2021-06-10 DIAGNOSIS — Z992 Dependence on renal dialysis: Secondary | ICD-10-CM | POA: Diagnosis not present

## 2021-06-10 DIAGNOSIS — N186 End stage renal disease: Secondary | ICD-10-CM | POA: Diagnosis not present

## 2021-06-11 ENCOUNTER — Emergency Department: Payer: Medicare HMO

## 2021-06-11 ENCOUNTER — Observation Stay
Admission: EM | Admit: 2021-06-11 | Discharge: 2021-06-15 | Disposition: A | Discharge status: Home / Self Care | Payer: Medicare HMO | Attending: Internal Medicine | Admitting: Internal Medicine

## 2021-06-11 ENCOUNTER — Encounter: Payer: Self-pay | Admitting: Internal Medicine

## 2021-06-11 ENCOUNTER — Other Ambulatory Visit: Payer: Self-pay

## 2021-06-11 DIAGNOSIS — I517 Cardiomegaly: Secondary | ICD-10-CM | POA: Diagnosis not present

## 2021-06-11 DIAGNOSIS — Z8616 Personal history of COVID-19: Secondary | ICD-10-CM | POA: Diagnosis not present

## 2021-06-11 DIAGNOSIS — E1169 Type 2 diabetes mellitus with other specified complication: Secondary | ICD-10-CM

## 2021-06-11 DIAGNOSIS — D631 Anemia in chronic kidney disease: Secondary | ICD-10-CM | POA: Insufficient documentation

## 2021-06-11 DIAGNOSIS — I152 Hypertension secondary to endocrine disorders: Secondary | ICD-10-CM

## 2021-06-11 DIAGNOSIS — N186 End stage renal disease: Secondary | ICD-10-CM | POA: Diagnosis not present

## 2021-06-11 DIAGNOSIS — H5440 Blindness, one eye, unspecified eye: Secondary | ICD-10-CM | POA: Diagnosis present

## 2021-06-11 DIAGNOSIS — J449 Chronic obstructive pulmonary disease, unspecified: Secondary | ICD-10-CM | POA: Insufficient documentation

## 2021-06-11 DIAGNOSIS — Z8673 Personal history of transient ischemic attack (TIA), and cerebral infarction without residual deficits: Secondary | ICD-10-CM | POA: Diagnosis not present

## 2021-06-11 DIAGNOSIS — E1151 Type 2 diabetes mellitus with diabetic peripheral angiopathy without gangrene: Secondary | ICD-10-CM | POA: Diagnosis not present

## 2021-06-11 DIAGNOSIS — Z87891 Personal history of nicotine dependence: Secondary | ICD-10-CM | POA: Insufficient documentation

## 2021-06-11 DIAGNOSIS — J810 Acute pulmonary edema: Secondary | ICD-10-CM

## 2021-06-11 DIAGNOSIS — E1122 Type 2 diabetes mellitus with diabetic chronic kidney disease: Secondary | ICD-10-CM | POA: Diagnosis not present

## 2021-06-11 DIAGNOSIS — Z7982 Long term (current) use of aspirin: Secondary | ICD-10-CM | POA: Diagnosis not present

## 2021-06-11 DIAGNOSIS — F341 Dysthymic disorder: Secondary | ICD-10-CM

## 2021-06-11 DIAGNOSIS — E1159 Type 2 diabetes mellitus with other circulatory complications: Secondary | ICD-10-CM

## 2021-06-11 DIAGNOSIS — E1165 Type 2 diabetes mellitus with hyperglycemia: Secondary | ICD-10-CM | POA: Diagnosis not present

## 2021-06-11 DIAGNOSIS — Z794 Long term (current) use of insulin: Secondary | ICD-10-CM | POA: Diagnosis not present

## 2021-06-11 DIAGNOSIS — I132 Hypertensive heart and chronic kidney disease with heart failure and with stage 5 chronic kidney disease, or end stage renal disease: Secondary | ICD-10-CM | POA: Diagnosis not present

## 2021-06-11 DIAGNOSIS — I129 Hypertensive chronic kidney disease with stage 1 through stage 4 chronic kidney disease, or unspecified chronic kidney disease: Secondary | ICD-10-CM | POA: Diagnosis not present

## 2021-06-11 DIAGNOSIS — I5042 Chronic combined systolic (congestive) and diastolic (congestive) heart failure: Secondary | ICD-10-CM | POA: Diagnosis not present

## 2021-06-11 DIAGNOSIS — I5020 Unspecified systolic (congestive) heart failure: Secondary | ICD-10-CM | POA: Diagnosis not present

## 2021-06-11 DIAGNOSIS — R0602 Shortness of breath: Secondary | ICD-10-CM | POA: Insufficient documentation

## 2021-06-11 DIAGNOSIS — Z7901 Long term (current) use of anticoagulants: Secondary | ICD-10-CM | POA: Diagnosis not present

## 2021-06-11 DIAGNOSIS — I2510 Atherosclerotic heart disease of native coronary artery without angina pectoris: Secondary | ICD-10-CM | POA: Insufficient documentation

## 2021-06-11 DIAGNOSIS — Z992 Dependence on renal dialysis: Secondary | ICD-10-CM | POA: Diagnosis not present

## 2021-06-11 DIAGNOSIS — J45909 Unspecified asthma, uncomplicated: Secondary | ICD-10-CM | POA: Insufficient documentation

## 2021-06-11 DIAGNOSIS — Z79899 Other long term (current) drug therapy: Secondary | ICD-10-CM | POA: Diagnosis not present

## 2021-06-11 DIAGNOSIS — Z20822 Contact with and (suspected) exposure to covid-19: Secondary | ICD-10-CM | POA: Insufficient documentation

## 2021-06-11 DIAGNOSIS — I10 Essential (primary) hypertension: Secondary | ICD-10-CM | POA: Diagnosis present

## 2021-06-11 DIAGNOSIS — E8779 Other fluid overload: Secondary | ICD-10-CM

## 2021-06-11 DIAGNOSIS — I82409 Acute embolism and thrombosis of unspecified deep veins of unspecified lower extremity: Secondary | ICD-10-CM | POA: Diagnosis present

## 2021-06-11 DIAGNOSIS — I120 Hypertensive chronic kidney disease with stage 5 chronic kidney disease or end stage renal disease: Secondary | ICD-10-CM | POA: Diagnosis not present

## 2021-06-11 DIAGNOSIS — Z951 Presence of aortocoronary bypass graft: Secondary | ICD-10-CM | POA: Insufficient documentation

## 2021-06-11 DIAGNOSIS — R739 Hyperglycemia, unspecified: Secondary | ICD-10-CM | POA: Diagnosis not present

## 2021-06-11 DIAGNOSIS — N2581 Secondary hyperparathyroidism of renal origin: Secondary | ICD-10-CM | POA: Diagnosis not present

## 2021-06-11 DIAGNOSIS — E8770 Fluid overload, unspecified: Secondary | ICD-10-CM | POA: Diagnosis present

## 2021-06-11 DIAGNOSIS — E876 Hypokalemia: Secondary | ICD-10-CM | POA: Diagnosis not present

## 2021-06-11 DIAGNOSIS — E782 Mixed hyperlipidemia: Secondary | ICD-10-CM | POA: Diagnosis present

## 2021-06-11 DIAGNOSIS — I25118 Atherosclerotic heart disease of native coronary artery with other forms of angina pectoris: Secondary | ICD-10-CM | POA: Diagnosis not present

## 2021-06-11 DIAGNOSIS — E785 Hyperlipidemia, unspecified: Secondary | ICD-10-CM

## 2021-06-11 DIAGNOSIS — J90 Pleural effusion, not elsewhere classified: Secondary | ICD-10-CM | POA: Diagnosis not present

## 2021-06-11 DIAGNOSIS — F32A Depression, unspecified: Secondary | ICD-10-CM | POA: Diagnosis present

## 2021-06-11 LAB — RESP PANEL BY RT-PCR (FLU A&B, COVID) ARPGX2
Influenza A by PCR: NEGATIVE
Influenza B by PCR: NEGATIVE
SARS Coronavirus 2 by RT PCR: NEGATIVE

## 2021-06-11 LAB — BASIC METABOLIC PANEL WITH GFR
Anion gap: 11 (ref 5–15)
BUN: 17 mg/dL (ref 6–20)
CO2: 29 mmol/L (ref 22–32)
Calcium: 7.8 mg/dL — ABNORMAL LOW (ref 8.9–10.3)
Chloride: 95 mmol/L — ABNORMAL LOW (ref 98–111)
Creatinine, Ser: 4.23 mg/dL — ABNORMAL HIGH (ref 0.44–1.00)
GFR, Estimated: 12 mL/min — ABNORMAL LOW (ref >60)
Glucose, Bld: 324 mg/dL — ABNORMAL HIGH (ref 70–99)
Potassium: 2.9 mmol/L — ABNORMAL LOW (ref 3.5–5.1)
Sodium: 135 mmol/L (ref 135–145)

## 2021-06-11 LAB — CBC
HCT: 26.4 % — ABNORMAL LOW (ref 36.0–46.0)
Hemoglobin: 8.6 g/dL — ABNORMAL LOW (ref 12.0–15.0)
MCH: 28.7 pg (ref 26.0–34.0)
MCHC: 32.6 g/dL (ref 30.0–36.0)
MCV: 88 fL (ref 80.0–100.0)
Platelets: 255 K/uL (ref 150–400)
RBC: 3 MIL/uL — ABNORMAL LOW (ref 3.87–5.11)
RDW: 15.3 % (ref 11.5–15.5)
WBC: 7.6 K/uL (ref 4.0–10.5)
nRBC: 0 % (ref 0.0–0.2)

## 2021-06-11 LAB — GLUCOSE, CAPILLARY
Glucose-Capillary: 163 mg/dL — ABNORMAL HIGH (ref 70–99)
Glucose-Capillary: 173 mg/dL — ABNORMAL HIGH (ref 70–99)

## 2021-06-11 LAB — TROPONIN I (HIGH SENSITIVITY)
Troponin I (High Sensitivity): 15 ng/L (ref <18)
Troponin I (High Sensitivity): 14 ng/L (ref <18)

## 2021-06-11 LAB — HEPATITIS B SURFACE ANTIGEN: Hepatitis B Surface Ag: NONREACTIVE

## 2021-06-11 IMAGING — CR DG CHEST 2V
1 series · 2 of 2 positions shown · non-contrast
Comparison: [DATE].

CLINICAL DATA: Shortness of breath.  Back pain.

EXAM:
CHEST - 2 VIEW

[Series 1: dg chest 2 view · 0.14mm/px · 2 of 2 slices shown]
[im 1/2]
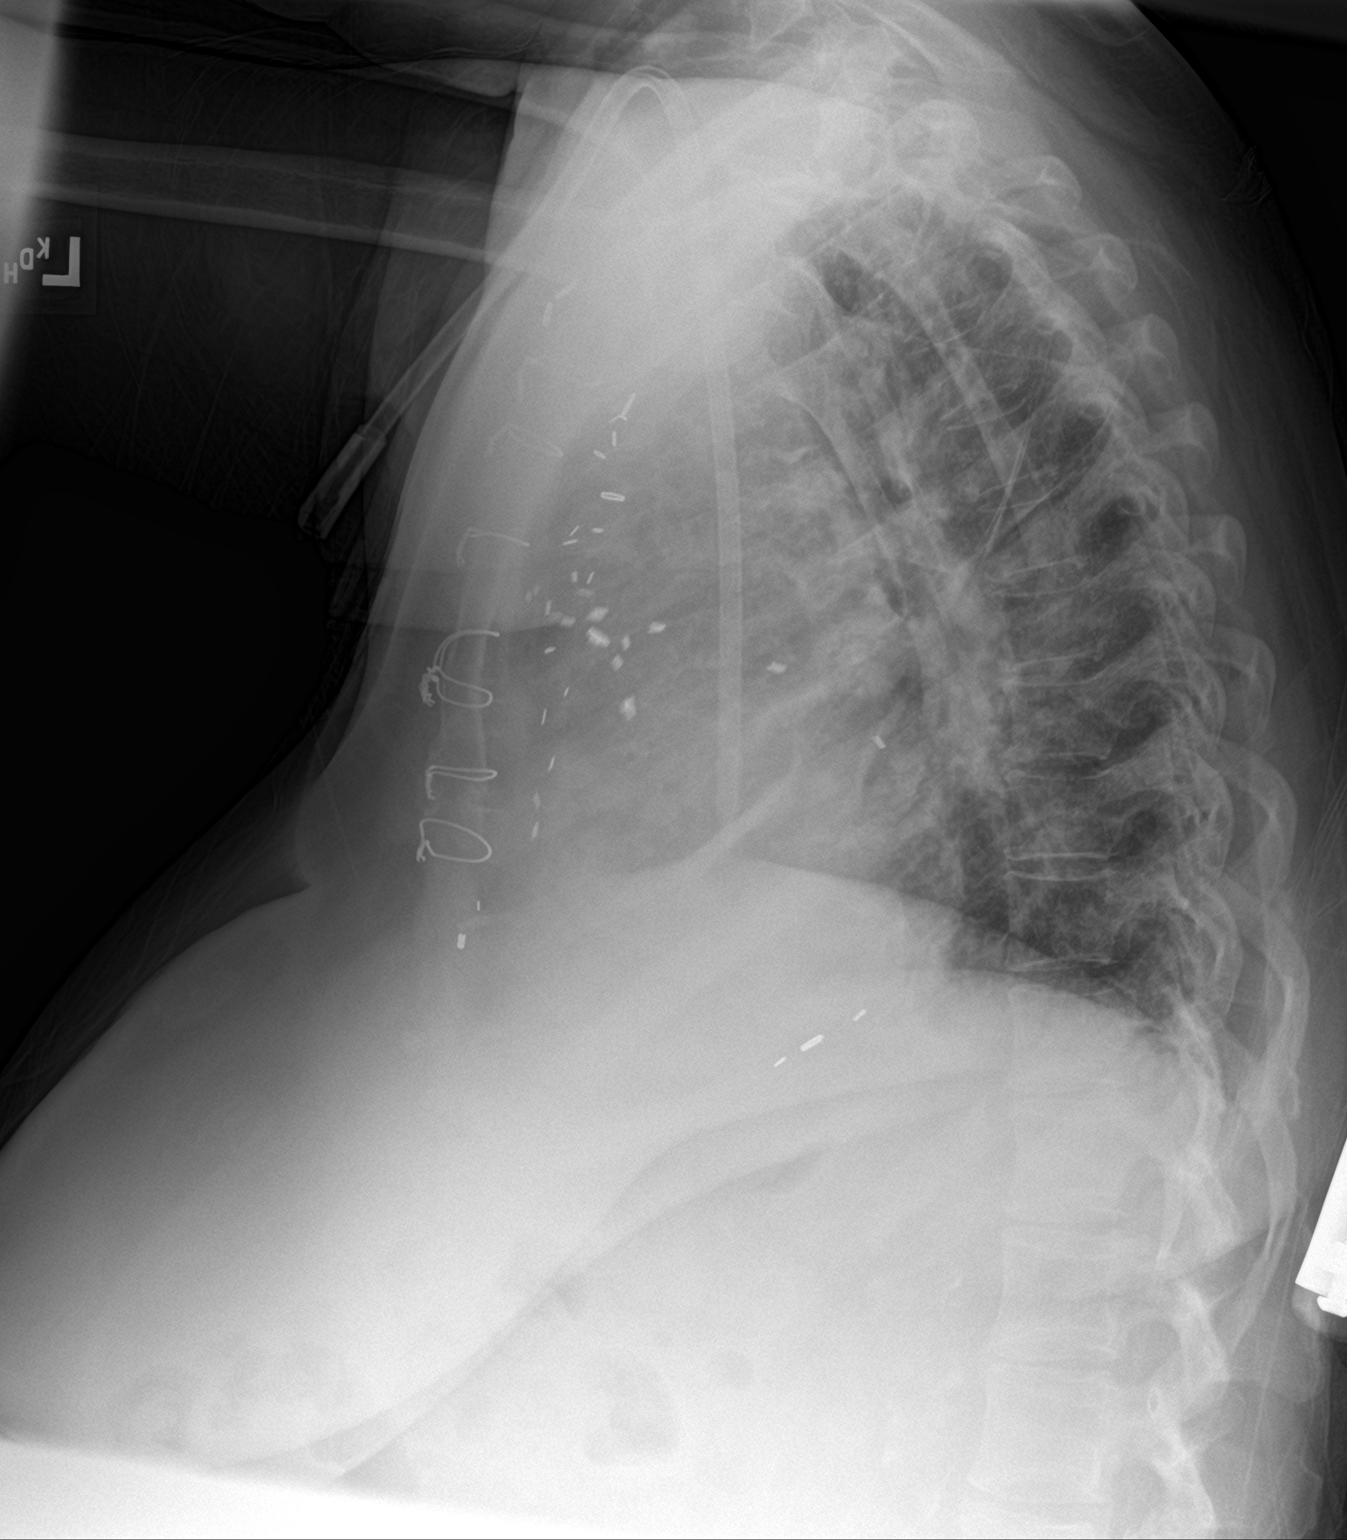
[im 2/2]
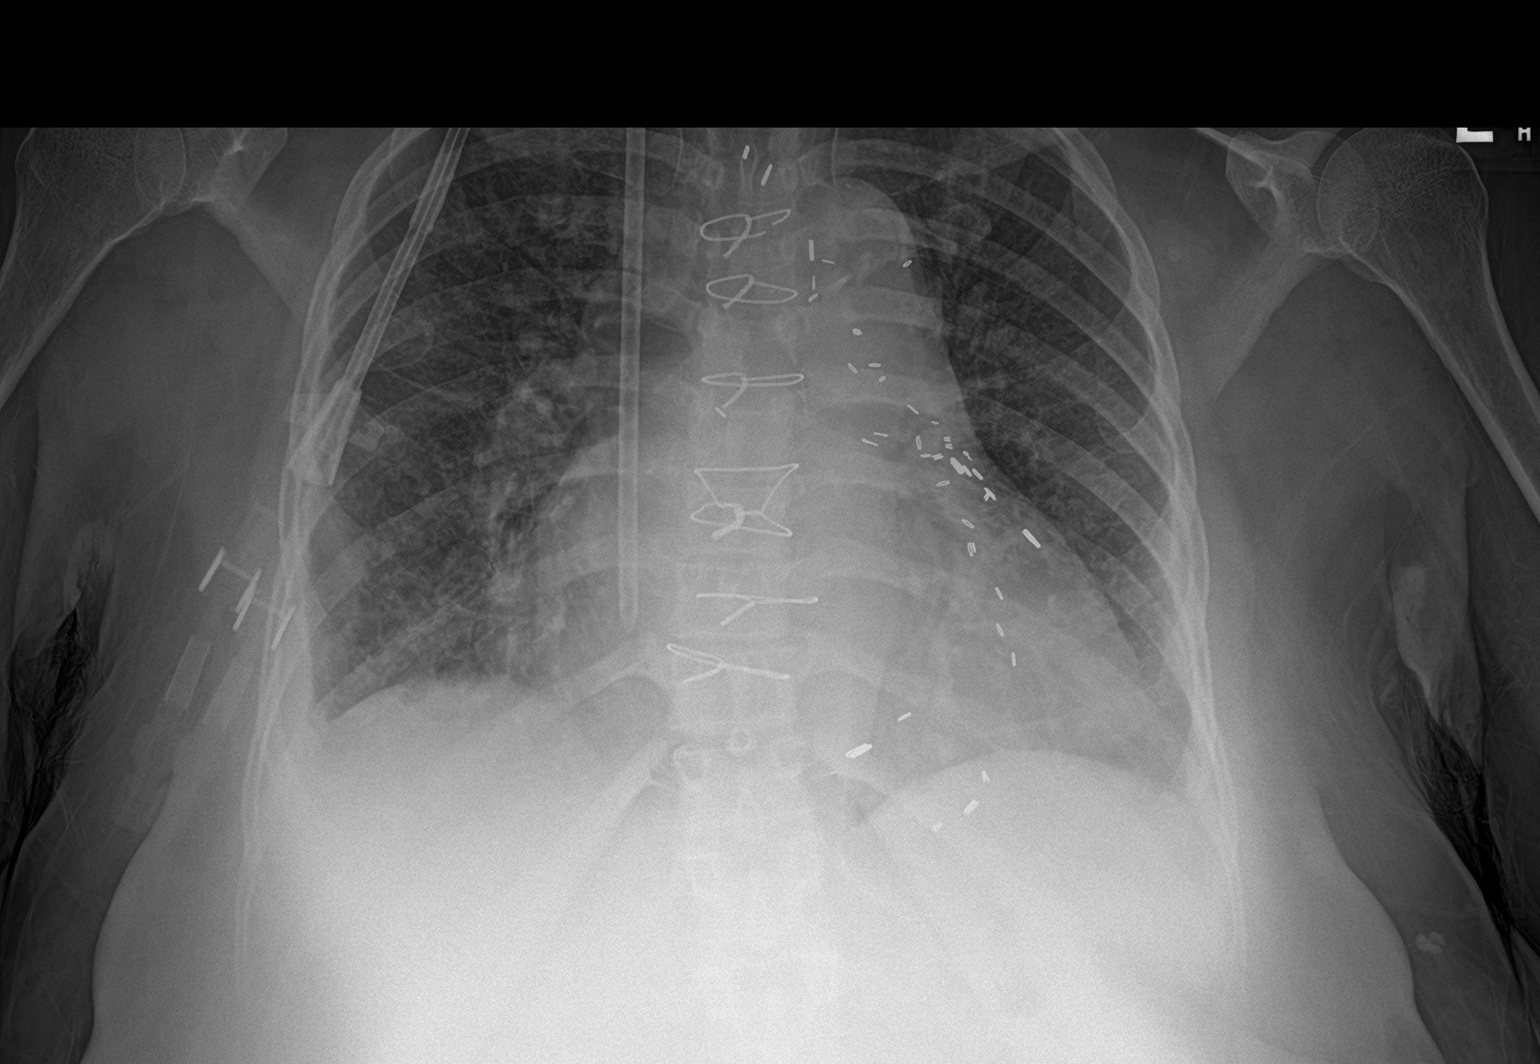

[2 of 2 positions shown; findings below may reference images not displayed]

FINDINGS: Right IJ line with tip again noted over the right atrium in
unchanged position. Prior CABG. Stable cardiomegaly. Diffuse
bilateral interstitial prominence and small bilateral pleural
effusions. Findings suggest mild CHF. Similar findings noted on
prior exam. No pneumothorax.
IMPRESSION: 1. Right IJ line with tip again noted over the right atrium in
unchanged position.

2. Prior CABG. Stable cardiomegaly. Diffuse bilateral interstitial
prominence and small bilateral pleural effusions consistent with
mild CHF. Similar findings noted on prior exam.

## 2021-06-11 MED ORDER — CHLORHEXIDINE GLUCONATE CLOTH 2 % EX PADS
6.0000 | MEDICATED_PAD | Freq: Every day | CUTANEOUS | Status: DC
  Administered 2021-06-12: 6 via TOPICAL
  Filled 2021-06-11 (×2): qty 6

## 2021-06-11 MED ORDER — HEPARIN SODIUM (PORCINE) 5000 UNIT/ML IJ SOLN
5000.0000 [IU] | Freq: Three times a day (TID) | INTRAMUSCULAR | Status: DC
  Administered 2021-06-11 – 2021-06-15 (×10): 5000 [IU] via SUBCUTANEOUS
  Filled 2021-06-11 (×11): qty 1

## 2021-06-11 MED ORDER — ACETAMINOPHEN 325 MG PO TABS: 650.0000 mg | ORAL_TABLET | Freq: Four times a day (QID) | ORAL | Status: DC | PRN

## 2021-06-11 MED ORDER — CLONIDINE HCL 0.1 MG PO TABS
0.1000 mg | ORAL_TABLET | Freq: Two times a day (BID) | ORAL | Status: DC
  Administered 2021-06-12 – 2021-06-14 (×5): 0.1 mg via ORAL
  Filled 2021-06-11 (×6): qty 1

## 2021-06-11 MED ORDER — TOPIRAMATE 25 MG PO TABS
25.0000 mg | ORAL_TABLET | Freq: Every day | ORAL | Status: DC
  Administered 2021-06-11 – 2021-06-15 (×5): 25 mg via ORAL
  Filled 2021-06-11 (×5): qty 1

## 2021-06-11 MED ORDER — ACETAMINOPHEN 650 MG RE SUPP: 650.0000 mg | Freq: Four times a day (QID) | RECTAL | Status: DC | PRN

## 2021-06-11 MED ORDER — INSULIN ASPART PROT & ASPART (70-30 MIX) 100 UNIT/ML ~~LOC~~ SUSP
15.0000 [IU] | Freq: Two times a day (BID) | SUBCUTANEOUS | Status: DC
  Administered 2021-06-11 – 2021-06-15 (×8): 15 [IU] via SUBCUTANEOUS
  Filled 2021-06-11 (×9): qty 10

## 2021-06-11 MED ORDER — ASPIRIN EC 81 MG PO TBEC
81.0000 mg | DELAYED_RELEASE_TABLET | Freq: Every day | ORAL | Status: DC
  Administered 2021-06-11 – 2021-06-15 (×4): 81 mg via ORAL
  Filled 2021-06-11 (×5): qty 1

## 2021-06-11 MED ORDER — VITAMIN B-12 1000 MCG PO TABS
1000.0000 ug | ORAL_TABLET | Freq: Every day | ORAL | Status: DC
  Administered 2021-06-11 – 2021-06-15 (×5): 1000 ug via ORAL
  Filled 2021-06-11 (×6): qty 1

## 2021-06-11 MED ORDER — FENOFIBRATE 160 MG PO TABS
160.0000 mg | ORAL_TABLET | Freq: Every day | ORAL | Status: DC
  Administered 2021-06-12 – 2021-06-15 (×4): 160 mg via ORAL
  Filled 2021-06-11 (×5): qty 1

## 2021-06-11 MED ORDER — MIRTAZAPINE 15 MG PO TABS
30.0000 mg | ORAL_TABLET | Freq: Every day | ORAL | Status: DC
  Administered 2021-06-11 – 2021-06-14 (×4): 30 mg via ORAL
  Filled 2021-06-11 (×4): qty 2

## 2021-06-11 MED ORDER — LIDOCAINE 5 % EX PTCH
2.0000 | MEDICATED_PATCH | CUTANEOUS | Status: DC
  Administered 2021-06-11 – 2021-06-12 (×2): 2 via TRANSDERMAL
  Filled 2021-06-11 (×3): qty 2

## 2021-06-11 MED ORDER — INSULIN ASPART 100 UNIT/ML IJ SOLN
0.0000 [IU] | Freq: Every day | INTRAMUSCULAR | Status: DC
  Administered 2021-06-12 – 2021-06-14 (×2): 2 [IU] via SUBCUTANEOUS
  Filled 2021-06-11 (×2): qty 1

## 2021-06-11 MED ORDER — ISOSORBIDE MONONITRATE ER 30 MG PO TB24
30.0000 mg | ORAL_TABLET | Freq: Every day | ORAL | Status: DC
  Administered 2021-06-13 – 2021-06-15 (×3): 30 mg via ORAL
  Filled 2021-06-11 (×3): qty 1

## 2021-06-11 MED ORDER — INSULIN ASPART 100 UNIT/ML IJ SOLN
0.0000 [IU] | Freq: Three times a day (TID) | INTRAMUSCULAR | Status: DC
  Administered 2021-06-11: 1 [IU] via SUBCUTANEOUS
  Administered 2021-06-12: 2 [IU] via SUBCUTANEOUS
  Administered 2021-06-12 – 2021-06-13 (×2): 3 [IU] via SUBCUTANEOUS
  Administered 2021-06-13: 1 [IU] via SUBCUTANEOUS
  Administered 2021-06-13: 2 [IU] via SUBCUTANEOUS
  Administered 2021-06-14: 1 [IU] via SUBCUTANEOUS
  Administered 2021-06-14 – 2021-06-15 (×2): 3 [IU] via SUBCUTANEOUS
  Filled 2021-06-11 (×9): qty 1

## 2021-06-11 MED ORDER — ONDANSETRON HCL 4 MG PO TABS
4.0000 mg | ORAL_TABLET | Freq: Four times a day (QID) | ORAL | Status: DC | PRN
Start: 2021-06-11 — End: 2021-06-16

## 2021-06-11 MED ORDER — PANTOPRAZOLE SODIUM 40 MG PO TBEC
80.0000 mg | DELAYED_RELEASE_TABLET | Freq: Every day | ORAL | Status: DC
  Administered 2021-06-11 – 2021-06-15 (×5): 80 mg via ORAL
  Filled 2021-06-11 (×5): qty 2

## 2021-06-11 MED ORDER — ATORVASTATIN CALCIUM 20 MG PO TABS
80.0000 mg | ORAL_TABLET | Freq: Every day | ORAL | Status: DC
  Administered 2021-06-11 – 2021-06-15 (×5): 80 mg via ORAL
  Filled 2021-06-11 (×5): qty 4

## 2021-06-11 MED ORDER — SPIRONOLACTONE 25 MG PO TABS
25.0000 mg | ORAL_TABLET | Freq: Every day | ORAL | Status: DC
  Administered 2021-06-11 – 2021-06-15 (×4): 25 mg via ORAL
  Filled 2021-06-11 (×5): qty 1

## 2021-06-11 MED ORDER — TRAZODONE HCL 50 MG PO TABS
50.0000 mg | ORAL_TABLET | Freq: Every day | ORAL | Status: DC
  Administered 2021-06-11 – 2021-06-14 (×4): 50 mg via ORAL
  Filled 2021-06-11 (×4): qty 1

## 2021-06-11 MED ORDER — CARVEDILOL 25 MG PO TABS
25.0000 mg | ORAL_TABLET | Freq: Two times a day (BID) | ORAL | Status: DC
  Administered 2021-06-11 – 2021-06-15 (×7): 25 mg via ORAL
  Filled 2021-06-11 (×7): qty 1

## 2021-06-11 MED ORDER — AMLODIPINE BESYLATE 10 MG PO TABS
10.0000 mg | ORAL_TABLET | Freq: Every day | ORAL | Status: DC
  Administered 2021-06-11 – 2021-06-15 (×4): 10 mg via ORAL
  Filled 2021-06-11 (×4): qty 1

## 2021-06-11 MED ORDER — ONDANSETRON HCL 4 MG/2ML IJ SOLN
4.0000 mg | Freq: Four times a day (QID) | INTRAMUSCULAR | Status: DC | PRN
  Administered 2021-06-13: 4 mg via INTRAVENOUS
  Filled 2021-06-11: qty 2

## 2021-06-11 NOTE — ED Notes (Signed)
Attempt to draw labs left AC unsuccessful.  Patient to Xray.  Lab contacted to draw labs.

## 2021-06-11 NOTE — ED Provider Notes (Addendum)
[facility] Emergency Department Provider Note  ____________________________________________   Event Date/Time   First MD Initiated Contact with Patient 06/11/21 [phone]     (approximate)  I have reviewed the triage vital signs and the nursing notes.   HISTORY  Chief Complaint Shortness of Breath    HPI Terry Arroyo is a 60 y.o. female here with shortness of breath.  The patient states that over the last 2 to 3 days, she has had progressively worsening shortness of breath.  She is end-stage renal disease and did go to dialysis yesterday.  Did not pull off much fluid, however, because her potassium was low.  Since then, she has had progressive worsening shortness of breath, body aches, back pain.  She has been generally fatigued.  She had difficulty getting around the house.  Her blood sugars been elevated.  She has had diffuse body aches.  Denies any fevers.  She feels markedly short of breath, even at rest, and is having difficulty speaking in full sentences.  Remainder of history limited due to mild respiratory distress.    Past Medical History:  Diagnosis Date   Anemia    Angina at rest [facility])    Angioedema 03/20/2021   Lisinopril   Anterior cerebral aneurysm    approximately 5 mm; left MCA    Anxiety    Aortic atherosclerosis (HCC)    Asthma    Blind left eye    COPD (chronic obstructive pulmonary disease) (HCC)    Coronary artery disease    Depression    DOE (dyspnea on exertion)    ESRD (end stage renal disease) (HCC)    Gait instability    GERD (gastroesophageal reflux disease)    HFrEF (heart failure with reduced ejection fraction) ([place])    History of 2019 novel coronavirus disease (COVID-19) 03/2019   [place]   History of DVT (deep vein thrombosis)    Hx of CABG 2018   x 4; performed in [place]   Hyperlipidemia    Hypertension    MI (myocardial infarction) ([place]) 2018   no stents   Pituitary adenoma  ([place])    Secondary hyperparathyroidism ([place])    Sleep apnea    non-compliant with nocturnal PAP therapy   Stroke [facility]) 2014   stated affected left side.   T2DM (type 2 diabetes mellitus) ([place])     Patient Active Problem List   Diagnosis Date Noted   Fluid overload 06/11/2021   Acute pulmonary edema (HCC)    COPD (chronic obstructive pulmonary disease) with emphysema ([place]) 02/09/2021   Chronic sphenoidal sinusitis 01/01/2021   Contact dermatitis and other eczema due to other specified agent 01/01/2021   Hydronephrosis, left 01/01/2021   Other diseases of nasal cavity and sinuses(478.19) 01/01/2021   ESRD (end stage renal disease) ([place])    Reactive thrombocytosis 11/08/2020   Nephrotic syndrome 11/04/2020   SOB (shortness of breath) 11/01/2020   Acute on chronic combined systolic and diastolic CHF (congestive heart failure) ([place]) 10/21/2020   CAD (coronary artery disease) 10/21/2020   Hyperlipidemia associated with type 2 diabetes mellitus ([place]) 10/21/2020   HFrEF (heart failure with reduced ejection fraction) (HCC)    Acute respiratory distress    COPD exacerbation ([place]) 10/10/2020   Kidney hematoma 09/13/2020   Hypertension associated with diabetes ([place])    Acute renal failure superimposed on stage 4 chronic kidney disease ([place])    Hematuria 09/12/2020   Benign neoplasm of pituitary gland  and craniopharyngeal duct ([place]) 09/03/2020   Blind left eye 09/03/2020   Brain aneurysm 09/03/2020   Esotropia 09/03/2020   Lesion of ulnar nerve 09/03/2020   Mixed hyperlipidemia 09/03/2020   Sensory hearing loss, bilateral 09/03/2020   Tear film insufficiency 09/03/2020   Health maintenance examination 06/27/2020   Secondary hyperparathyroidism of renal origin ([place]) 04/24/2020   Anemia in chronic kidney disease 03/10/2020   Benign hypertensive kidney disease with chronic kidney disease 03/10/2020   Hyposmolality and/or hyponatremia 03/10/2020   Stage 3b chronic kidney disease ([place])  03/10/2020   Calf tenderness    Acute on chronic heart failure ([place]) 02/27/2020   Leg edema    DM2 (diabetes mellitus, type 2) ([place]) 02/26/2020   CKD (chronic kidney disease) stage 4, GFR 15-29 ml/min (HCC) 02/26/2020   Hyponatremia 02/26/2020   Anasarca 02/26/2020   Proteinuria 02/26/2020   Hypoalbuminemia 02/26/2020   Elevated troponin 02/26/2020   Acute on chronic systolic CHF (congestive heart failure) ([place]) 02/26/2020   Aortic atherosclerosis ([place]) 02/18/2020   Hyperglycemia    Nausea    Hypertensive emergency    Hypertensive urgency 11/24/2019   Chest pain 11/23/2019   OSA (obstructive sleep apnea) 04/10/2019   Mild episode of recurrent major depressive disorder ([place]) 04/10/2019   ACS (acute coronary syndrome) ([place]) 03/24/2019   Pneumonia due to COVID-19 virus 03/24/2019   Hypertensive heart/kidney disease w/chronic kidney disease stage III ([place]) 10/21/2017   Mild nonproliferative diabetic retinopathy of right eye associated with type 2 diabetes mellitus ([place]) 09/09/2017   Genital herpes 05/07/2017   DVT (deep venous thrombosis) ([place]) 12/30/2016   PVD (posterior vitreous detachment), right eye 01/09/2016   Diabetic peripheral angiopathy ([place]) 01/03/2016   Age-related nuclear cataract of both eyes 10/24/2015   Chronic nonintractable headache 10/02/2015   Dry eyes, bilateral 02/13/2015   Proptosis 12/18/2014   Iritis of right eye 12/03/2014   Anemia 08/30/2014   Depression 08/30/2014    Past Surgical History:  Procedure Laterality Date   AV FISTULA PLACEMENT Right 01/28/2021   Procedure: ARTERIOVENOUS (AV) FISTULA CREATION;  Surgeon: Katha Cabal, MD;  Location: [facility] ORS;  Service: Vascular;  Laterality: Right;   AV FISTULA PLACEMENT Right 05/13/2021   Procedure: INSERTION OF ARTERIOVENOUS (AV) GORE-TEX GRAFT ARM ( BRACHIAL AXILLARY );  Surgeon: Katha Cabal, MD;  Location: [facility] ORS;  Service: Vascular;  Laterality: Right;   [place]   pituitary  tumor   BREAST EXCISIONAL BIOPSY Left yrs ago   benign   CESAREAN SECTION     CORONARY ARTERY BYPASS GRAFT  2018   x 4 vessels; performed in [place] N/A 11/07/2020   Procedure: DIALYSIS/PERMA CATHETER INSERTION;  Surgeon: Algernon Huxley, MD;  Location: [place] [facility];  Service: Cardiovascular;  Laterality: N/A;   EYE SURGERY Right 2021   cataracts    PERIPHERAL VASCULAR THROMBECTOMY Right 04/03/2021   Procedure: A/V Fistulagram ;  Surgeon: Katha Cabal, MD;  Location: [place] [facility];  Service: Cardiovascular;  Laterality: Right;    Prior to Admission medications   Medication Sig Start Date End Date Taking? Authorizing Provider  acetaminophen (TYLENOL) 500 MG tablet Take 500-1,000 mg by mouth every 6 (six) hours as needed for mild pain or moderate pain.   Yes [provider]  albuterol (VENTOLIN HFA) 108 (90 Base) MCG/ACT inhaler Inhale 2 puffs into the lungs every 6 (six) hours as needed for wheezing or shortness of breath.  Yes [provider]  amLODipine (NORVASC) 10 MG tablet Take 1 tablet (10 mg total) by mouth daily. 03/03/20  Yes Lorella Nimrod, MD  ammonium lactate (LAC-HYDRIN) 12 % lotion Apply 1 application topically daily.   Yes [provider]  aspirin 81 MG EC tablet Take 1 tablet (81 mg total) by mouth daily. 02/10/21  Yes Max Sane, MD  atorvastatin (LIPITOR) 80 MG tablet Take 1 tablet (80 mg total) by mouth daily at 6 PM. 11/26/19  Yes Lavina Hamman, MD  bismuth subsalicylate (PEPTO BISMOL) 262 MG/15ML suspension Take 30 mLs by mouth every 6 (six) hours as needed for indigestion or diarrhea or loose stools.   Yes [provider]  carvedilol (COREG) 25 MG tablet Take 25 mg by mouth 2 (two) times daily.   Yes [provider]  cloNIDine (CATAPRES) 0.1 MG tablet Take 0.1 mg by mouth 2 (two) times daily. (Non-dialysis days)   Yes [provider]  fenofibrate (TRICOR) 145 MG  tablet Take 145 mg by mouth daily.   Yes [provider]  furosemide (LASIX) 40 MG tablet Take 40 mg by mouth daily.   Yes [provider]  insulin aspart protamine- aspart (NOVOLOG MIX 70/30) (70-30) 100 UNIT/ML injection Inject 0.15 mLs (15 Units total) into the skin 2 (two) times daily with a meal. 11/12/20  Yes Fritzi Mandes, MD  isosorbide mononitrate (IMDUR) 30 MG 24 hr tablet Take 30 mg by mouth daily.   Yes [provider]  loperamide (IMODIUM A-D) 2 MG tablet Take 4 mg by mouth 4 (four) times daily as needed for diarrhea or loose stools.   Yes [provider]  mirtazapine (REMERON) 30 MG tablet Take 1 tablet (30 mg total) by mouth at bedtime. 11/26/19  Yes Lavina Hamman, MD  mometasone-formoterol [facility]) 200-5 MCG/ACT AERO Inhale 2 puffs into the lungs 2 (two) times daily.   Yes [provider]  omeprazole (PRILOSEC) 40 MG capsule Take 40 mg by mouth daily. 09/22/20  Yes [provider]  polyethylene glycol (MIRALAX / GLYCOLAX) 17 g packet Take 17 g by mouth daily. Patient taking differently: Take 17 g by mouth daily as needed for mild constipation or moderate constipation. 02/11/21  Yes Lorella Nimrod, MD  potassium chloride (KLOR-CON) 10 MEQ tablet Take 10 mEq by mouth daily.   Yes [provider]  topiramate (TOPAMAX) 25 MG tablet Take 25 mg by mouth daily. 08/19/20  Yes [provider]  traZODone (DESYREL) 50 MG tablet Take 50 mg by mouth at bedtime.   Yes [provider]  vitamin B-12 (CYANOCOBALAMIN) 1000 MCG tablet Take 1,000 mcg by mouth daily.   Yes [provider]  HYDROcodone-acetaminophen (NORCO) 5-325 MG tablet Take 1-2 tablets by mouth every 6 (six) hours as needed for moderate pain or severe pain. Patient not taking: No sig reported 05/13/21   Schnier, [name] Lory, MD    Allergies Hydralazine, Kiwi extract, Lisinopril, Shellfish allergy, Betadine [povidone iodine], Metformin, and  Tape  Family History  Problem Relation Age of Onset   CAD Mother    CAD Brother    Breast cancer Sister 66    Social History Social History   Tobacco Use   Smoking status: Former    Pack years: 0.00    Types: Cigarettes    Quit date: 2018    Years since quitting: 4.4   Smokeless tobacco: Never  Vaping Use   Vaping Use: Never used  Substance Use Topics  Alcohol use: Not Currently   Drug use: Not Currently    Review of Systems  Review of Systems  Constitutional:  Positive for fatigue. Negative for fever.  HENT:  Negative for congestion and sore throat.   Eyes:  Negative for visual disturbance.  Respiratory:  Positive for cough, shortness of breath and wheezing.   Cardiovascular:  Negative for chest pain.  Gastrointestinal:  Negative for abdominal pain, diarrhea, nausea and vomiting.  Genitourinary:  Negative for flank pain.  Musculoskeletal:  Negative for back pain and neck pain.  Skin:  Negative for rash and wound.  Neurological:  Positive for weakness.  All other systems reviewed and are negative.   ____________________________________________  PHYSICAL EXAM:      VITAL SIGNS: ED Triage Vitals  Enc Vitals Group     BP 06/11/21 0812 (!) 155/76     Pulse Rate 06/11/21 0812 64     Resp 06/11/21 0812 20     Temp 06/11/21 0812 98.6 F (37 C)     Temp Source 06/11/21 0812 Oral     SpO2 06/11/21 0812 97 %     Weight 06/11/21 0813 170 lb (77.1 kg)     Height 06/11/21 0813 5\' 6"  (1.676 m)     Head Circumference --      Peak Flow --      Pain Score 06/11/21 0813 5     Pain Loc --      Pain Edu? --      Excl. in [place]? --      Physical Exam Vitals and nursing note reviewed.  Constitutional:      General: She is not in acute distress.    Appearance: She is well-developed.  HENT:     Head: Normocephalic and atraumatic.  Eyes:     Conjunctiva/sclera: Conjunctivae normal.  Cardiovascular:     Rate and Rhythm: Normal rate and regular rhythm.     Heart sounds:  Normal heart sounds. No murmur heard.   No friction rub.  Pulmonary:     Effort: Pulmonary effort is normal. No respiratory distress.     Breath sounds: Examination of the right-lower field reveals rales. Examination of the left-lower field reveals rales. Wheezing and rales present.  Abdominal:     General: There is no distension.     Palpations: Abdomen is soft.     Tenderness: There is no abdominal tenderness.  Musculoskeletal:     Cervical back: Neck supple.     Right lower leg: Edema present.     Left lower leg: Edema present.  Skin:    General: Skin is warm.     Capillary Refill: Capillary refill takes less than 2 seconds.  Neurological:     Mental Status: She is alert and oriented to person, place, and time.     Motor: No abnormal muscle tone.      ____________________________________________   LABS (all labs ordered are listed, but only abnormal results are displayed)  Labs Reviewed  BASIC METABOLIC PANEL - Abnormal; Notable for the following components:      Result Value   Potassium 2.9 (*)    Chloride 95 (*)    Glucose, Bld 324 (*)    Creatinine, Ser 4.23 (*)    Calcium 7.8 (*)    GFR, Estimated 12 (*)    All other components within normal limits  CBC - Abnormal; Notable for the following components:   RBC 3.00 (*)    Hemoglobin 8.6 (*)  HCT 26.4 (*)    All other components within normal limits  RESP PANEL BY RT-PCR (FLU A&B, COVID) ARPGX2  POC URINE PREG, ED  TROPONIN I (HIGH SENSITIVITY)  TROPONIN I (HIGH SENSITIVITY)    ____________________________________________  EKG: Normal sinus rhythm, ventricular rate 64.  PR 136, QRS 110, QTc 472.  No acute ST elevations or depressions.  Nonspecific ST changes. ________________________________________  RADIOLOGY All imaging, including plain films, CT scans, and ultrasounds, independently reviewed by me, and interpretations confirmed via formal radiology reads.  ED MD interpretation:   Chest x-ray: Prior  CABG, mild CHF,  Official radiology report(s): DG Chest 2 View  Result Date: 06/11/2021 CLINICAL DATA:  Shortness of breath.  Back pain. EXAM: CHEST - 2 VIEW COMPARISON:  02/09/2021. FINDINGS: Right IJ line with tip again noted over the right atrium in unchanged position. Prior CABG. Stable cardiomegaly. Diffuse bilateral interstitial prominence and small bilateral pleural effusions. Findings suggest mild CHF. Similar findings noted on prior exam. No pneumothorax. IMPRESSION: 1. Right IJ line with tip again noted over the right atrium in unchanged position. 2. Prior CABG. Stable cardiomegaly. Diffuse bilateral interstitial prominence and small bilateral pleural effusions consistent with mild CHF. Similar findings noted on prior exam. Electronically Signed   By: [place]   On: 06/11/2021 09:02    ____________________________________________  PROCEDURES   Procedure(s) performed (including Critical Care):  Ultrasound ED Peripheral IV (Provider)  Date/Time: 06/11/2021 11:42 AM Performed by: Duffy Bruce, MD Authorized by: Duffy Bruce, MD   Procedure details:    Indications: multiple failed IV attempts     Skin Prep: chlorhexidine gluconate     Location:  Right forearm   Angiocath:  20 G   Bedside Ultrasound Guided: Yes     Images: not archived     Patient tolerated procedure without complications: Yes     Dressing applied: Yes    ____________________________________________  INITIAL IMPRESSION / MDM / [place] / ED COURSE  As part of my medical decision making, I reviewed the following data within the [place] notes reviewed and incorporated, Old chart reviewed, Notes from prior ED visits, and [place] was evaluated in Emergency Department on 06/11/2021 for the symptoms described in the history of present illness. She was evaluated in the context of the global COVID-19 pandemic, which  necessitated consideration that the patient might be at risk for infection with the SARS-CoV-2 virus that causes COVID-19. Institutional protocols and algorithms that pertain to the evaluation of patients at risk for COVID-19 are in a state of rapid change based on information released by regulatory bodies including the CDC and federal and state organizations. These policies and algorithms were followed during the patient's care in the ED.  Some ED evaluations and interventions may be delayed as a result of limited staffing during the pandemic.*     Medical Decision Making: 60 year old female here with shortness of breath.  Suspect dyspnea related to hypervolemia in the setting of end-stage renal disease and reportedly not being able to tolerate a full session due to her hypokalemia.  I suspect her hypokalemia may be related to poor p.o. intake.  Chest x-ray reviewed, does show interstitial prominence and bilateral effusions consistent with mild CHF.  She is tachypneic.  Lab work shows persistent hypokalemia and hyperglycemia.  Discussed with nephrology, will admit for dialysis and monitoring of electrolytes and symptom control.  Patient updated and in  agreement.  ____________________________________________  FINAL CLINICAL IMPRESSION(S) / ED DIAGNOSES  Final diagnoses:  Acute pulmonary edema (HCC)  ESRD (end stage renal disease) ([place])  Hypokalemia     MEDICATIONS GIVEN DURING THIS VISIT:  Medications  Chlorhexidine Gluconate Cloth 2 % PADS 6 each (has no administration in time range)     ED Discharge Orders     None        Note:  This document was prepared using Dragon voice recognition software and may include unintentional dictation errors.   Duffy Bruce, MD 06/11/21 1130    Duffy Bruce, MD 06/11/21 [phone]

## 2021-06-11 NOTE — ED Triage Notes (Signed)
Reports dialysis yesterday, low potassium and high blood sugar, felt yesterday evening but this am had shortness of breath with walking

## 2021-06-11 NOTE — Progress Notes (Signed)
[facility]  ROUNDING NOTE   Subjective:   [name] was admitted to [facility] on 06/11/2021 for Fluid overload [E87.70]  Last hemodialysis treatment was yesterday. She had 1.6 liters removed.   However patient today with shortness of breath, weakness and low glucose readings   Patient did use her AVG yesterday for the first time.    Objective:  Vital signs in last 24 hours:  Temp:  [98.6 F (37 C)] 98.6 F (37 C) (06/23 0812) Pulse Rate:  [62-65] 63 (06/23 1030) Resp:  [20-26] 26 (06/23 1030) BP: (151-159)/(69-88) 157/88 (06/23 1030) SpO2:  [97 %-100 %] 100 % (06/23 1030) Weight:  [77.1 kg] 77.1 kg (06/23 0813)  Weight change:  Filed Weights   06/11/21 0813  Weight: 77.1 kg    Intake/Output: No intake/output data recorded.   Intake/Output this shift:  No intake/output data recorded.  Physical Exam: General: In respiratory distress  Head: Normocephalic, atraumatic. Moist oral mucosal membranes  Eyes: Anicteric, PERRL  Neck: Supple, trachea midline  Lungs:  Crackles at bases, [place] O2.   Heart: Regular rate and rhythm  Abdomen:  Soft, nontender  Extremities:  + peripheral edema.  Neurologic: Nonfocal, moving all four extremities  Skin: No lesions  Access: Right AVG, Right IJ permcath    Basic Metabolic Panel: Recent Labs  Lab 06/11/21 0844  NA 135  K 2.9*  CL 95*  CO2 29  GLUCOSE 324*  BUN 17  CREATININE 4.23*  CALCIUM 7.8*    Liver Function Tests: No results for input(s): AST, ALT, ALKPHOS, BILITOT, PROT, ALBUMIN in the last 168 hours. No results for input(s): LIPASE, AMYLASE in the last 168 hours. No results for input(s): AMMONIA in the last 168 hours.  CBC: Recent Labs  Lab 06/11/21 0844  WBC 7.6  HGB 8.6*  HCT 26.4*  MCV 88.0  PLT 255    Cardiac Enzymes: No results for input(s): CKTOTAL, CKMB, CKMBINDEX, TROPONINI in the last 168 hours.  BNP: Invalid input(s): POCBNP  CBG: No results for input(s): GLUCAP in the  last 168 hours.  Microbiology: Results for orders placed or performed during the hospital encounter of 06/11/21  Resp Panel by RT-PCR (Flu A&B, Covid) Nasopharyngeal Swab     Status: None   Collection Time: 06/11/21 10:28 AM   Specimen: Nasopharyngeal Swab; Nasopharyngeal(NP) swabs in vial transport medium  Result Value Ref Range Status   SARS Coronavirus 2 by RT PCR NEGATIVE NEGATIVE Final    Comment: (NOTE) SARS-CoV-2 target nucleic acids are NOT DETECTED.  The SARS-CoV-2 RNA is generally detectable in upper respiratory specimens during the acute phase of infection. The lowest concentration of SARS-CoV-2 viral copies this assay can detect is 138 copies/mL. A negative result does not preclude SARS-Cov-2 infection and should not be used as the sole basis for treatment or other patient management decisions. A negative result may occur with  improper specimen collection/handling, submission of specimen other than nasopharyngeal swab, presence of viral mutation(s) within the areas targeted by this assay, and inadequate number of viral copies(<138 copies/mL). A negative result must be combined with clinical observations, patient history, and epidemiological information. The expected result is Negative.  Fact Sheet for Patients:  EntrepreneurPulse.com.au  Fact Sheet for Healthcare Providers:  IncredibleEmployment.be  This test is no t yet approved or cleared by the [place] FDA and  has been authorized for detection and/or diagnosis of SARS-CoV-2 by FDA under an Emergency Use Authorization (EUA). This EUA will remain  in effect (  meaning this test can be used) for the duration of the COVID-19 declaration under Section 564(b)(1) of the Act, 21 U.S.C.section 360bbb-3(b)(1), unless the authorization is terminated  or revoked sooner.       Influenza A by PCR NEGATIVE NEGATIVE Final   Influenza B by PCR NEGATIVE NEGATIVE Final    Comment:  (NOTE) The Xpert Xpress SARS-CoV-2/FLU/RSV plus assay is intended as an aid in the diagnosis of influenza from Nasopharyngeal swab specimens and should not be used as a sole basis for treatment. Nasal washings and aspirates are unacceptable for Xpert Xpress SARS-CoV-2/FLU/RSV testing.  Fact Sheet for Patients: EntrepreneurPulse.com.au  Fact Sheet for Healthcare Providers: IncredibleEmployment.be  This test is not yet approved or cleared by the [place] FDA and has been authorized for detection and/or diagnosis of SARS-CoV-2 by FDA under an Emergency Use Authorization (EUA). This EUA will remain in effect (meaning this test can be used) for the duration of the COVID-19 declaration under Section 564(b)(1) of the Act, 21 U.S.C. section 360bbb-3(b)(1), unless the authorization is terminated or revoked.  Performed at [facility], [address] [place], [place] [address]     Coagulation Studies: No results for input(s): LABPROT, INR in the last 72 hours.  Urinalysis: No results for input(s): COLORURINE, LABSPEC, PHURINE, GLUCOSEU, HGBUR, BILIRUBINUR, KETONESUR, PROTEINUR, UROBILINOGEN, NITRITE, LEUKOCYTESUR in the last 72 hours.  Invalid input(s): APPERANCEUR    Imaging: DG Chest 2 View  Result Date: 06/11/2021 CLINICAL DATA:  Shortness of breath.  Back pain. EXAM: CHEST - 2 VIEW COMPARISON:  02/09/2021. FINDINGS: Right IJ line with tip again noted over the right atrium in unchanged position. Prior CABG. Stable cardiomegaly. Diffuse bilateral interstitial prominence and small bilateral pleural effusions. Findings suggest mild CHF. Similar findings noted on prior exam. No pneumothorax. IMPRESSION: 1. Right IJ line with tip again noted over the right atrium in unchanged position. 2. Prior CABG. Stable cardiomegaly. Diffuse bilateral interstitial prominence and small bilateral pleural effusions consistent with mild CHF. Similar  findings noted on prior exam. Electronically Signed   By: [place]   On: 06/11/2021 09:02     Medications:     Chlorhexidine Gluconate Cloth  6 each Topical Q0600     Assessment/ Plan:  [name] is a 60 y.o. black female with end stage renal disease on hemodialysis, hypertension, angioedema to ARB/ACE-I, COPD/asthma, diabetes mellitus type II, diabetic retinopathy with left eye blindness, coronary artery disease, depression, congestive heart failure status post CABG, DVT, hyperlipidemia, CVA who is admitted to [facility] on 06/11/2021 for Fluid overload [E87.70]  [facility] MWF [facility] Right AVG 79kg  End Stage Renal Disease: hemodialysis treatment yesterday. However with volume overload and needs an extra dialysis treatment today. 4K bath due to hypokalemia.   Hypokalemia: continue potassium chloride. Start spironolactone on this admission. Hold furosemide.   Hypertension: elevated and fluid driven, 017/49. Home regimen of amlodipine, carvedilol, furosemide and isosorbide mononitrate. Holding furosemide.   Secondary Hyperparathyroidism:  not currently on binders.   Anemia with chronic kidney disease: hemoglobin 8.6, normocytic. EPO with MWF HD treatments.    LOS: 0 Alok Minshall 6/23/202211:29 AM

## 2021-06-11 NOTE — Progress Notes (Signed)
Patient arrived from HD °

## 2021-06-11 NOTE — Progress Notes (Signed)
Patient tolersated tx without incident. BFR maintained throughout treatment. Patient has voiced no concerns. Fluid removal 2.5 liters. No s/sx of infection at cathter site.

## 2021-06-11 NOTE — ED Notes (Signed)
Report given to dialysis RN

## 2021-06-11 NOTE — ED Triage Notes (Addendum)
Pt in via EMS from home. Pt has dialysis Mon, Wed, fri and has it done yesterday but had low potassium. Pt also with increase blood sugar. FSBS 448

## 2021-06-11 NOTE — H&P (Signed)
History and Physical   Terry Arroyo QQP:[id number] DOB: 1961/01/22 DOA: 06/11/2021  PCP: Kirk Ruths, MD  Outpatient Specialists: Dr. Clayborn Bigness Patient coming from: home  I have personally briefly reviewed patient's old medical records in [place].  Chief Concern: Shortness of breath  HPI: Terry Arroyo is a 60 y.o. female with medical history significant for CAD status post CABG 2018, hypertension, hyperlipidemia, end-stage renal disease on hemodialysis Monday Wednesday Friday, presents emergency department for chief concerns of shortness of breath.  She reports that she had her full dose of dialysis session on 06/10/2021. She states that she a full session of hemodialysis for 4 hours. She feels minimally improved after dialysis session.  She does not know how much fluid was removed on 6/22.  She reports that the shortness of breath has been gradually worsening over the last three weeks. She has not been able to lay down to sleep at night and has been sitting up in a chair to sleep.  At bedside she is awake alert and oriented to her name, age, location of hospital.  She denies fever, chest pain, headache, syncope, loss of consciousness, trauma to her body, dysuria.  She states that she still makes urine about 3 times a day.  Social history: She lives at home by herself.  She is a former tobacco user and quit in 2018.  She denies recreational drug use.  She denies EtOH.  She is currently disabled.  Vaccination history: She is vaccinated for COVID-19, 3 doses.  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  ROS: Constitutional: no weight change, no fever ENT/Mouth: no sore throat, no rhinorrhea Eyes: no eye pain, no vision changes Cardiovascular: no chest pain, + dyspnea,  no edema, no palpitations Respiratory: no cough, no sputum, no wheezing Gastrointestinal: no nausea, no vomiting, no diarrhea, no constipation Genitourinary: no urinary incontinence, no dysuria, no  hematuria Musculoskeletal: no arthralgias, no myalgias Skin: no skin lesions, no pruritus, Neuro: + weakness, no loss of consciousness, no syncope Psych: no anxiety, no depression, + decrease appetite Heme/Lymph: no bruising, no bleeding  ED Course: Discussed with ED provider, patient requiring hospitalization for chief concerns of shortness of breath.  Vitals in the emergency department was remarkable for temperature 98.6, respiration rate of 20, blood pressure 155/76, SPO2 of 97% on room air.  Labs in the emergency department was remarkable for serum sodium of 135, potassium 2.9, chloride 95, bicarb 29, BUN of 17, serum creatinine of 4.23, nonfasting blood glucose 324, WBC 7.6, hemoglobin 8.6, platelets 255.  Initial high-sensitivity troponin was 15 and decreased to 14.  COVID PCR/influenza A PCR/influenza B PCR were negative.  Assessment/Plan  Principal Problem:   Fluid overload Active Problems:   Hyperglycemia   Hypertension associated with diabetes (HCC)   HFrEF (heart failure with reduced ejection fraction) (HCC)   CAD (coronary artery disease)   Hyperlipidemia associated with type 2 diabetes mellitus ([place])   ESRD (end stage renal disease) (HCC)   Anemia in chronic kidney disease   Blind left eye   Diabetic peripheral angiopathy (HCC)   DVT (deep venous thrombosis) (HCC)   Depression   Mixed hyperlipidemia   Hypokalemia   # Fluid overload in setting of end-stage renal disease on hemodialysis # End-stage renal disease on hemodialysis-MWF - ED provider consulted nephrology, Dr. Juleen China who will see the patient - Patient will get additional hemodialysis session on day of admission  # Hypokalemia - 2.9 on admission - Patient  potassium ranges from 3.2-4.2 over the last 2 months - Treatment per nephrology  # Hypertension-elevated - Resumed amlodipine 10 mg daily, carvedilol 25 mg twice daily, - Clonidine 0.1 mg p.o. twice daily, resumed on nondialysis day, starting  dose 06/12/2021 at 10 AM - Isosorbide mononitrate 30 mg daily resumed for 06/12/2021 - Spironolactone 25 mg daily resumed  # Insulin-dependent diabetes mellitus-insulin aspart/protamine 15 units subcutaneous twice daily with meals as her home regimen and this has been resumed - Insulin SSI for end-stage renal disease patient with at bedtime coverage ordered - Renal/carb modified diet  # Heart failure reduced ejection-patient fluid overload # History of CAD status post CABG in 2018 - Dialysis as above - Spironolactone 25 mg daily carvedilol 25 mg twice daily, Imdur 30 mg daily resumed  # Low back pain-I suspect this is musculoskeletal secondary to sitting in a chair and sleeping for the last 3 weeks due to shortness of breath - Lidocaine patches, 2 patches, applied to the low back/lumbar area, every 24 hours, 3 days ordered  # Appetite loss-mirtazapine 30 mg nightly, outpatient follow-up  # Insomnia-trazodone 30 mg nightly resumed  # GERD-PPI # B12 deficiency-1000 mcg p.o. daily resumed # Hyperlipidemia-atorvastatin 80 mg nightly resumed, fenofibrate 160 mg daily # History of COVID-19 infection in April 2020 # History of COPD-compensated and not in acute exacerbation at this time  Chart reviewed.   Echo on 11/02/2020: Ejection fraction was estimated at 40 to 62%, diastolic parameters were within normal limits, mild to moderate mitral valve regurgitation  DVT prophylaxis: Heparin 5000 units subcutaneous every 8 hours Code Status: Full code Diet: Renal/carb modified diet Family Communication: Per patient request, I attempted to call her father to update regarding her admission status.  I attempted to call [phone] busy signal, [phone] went directly to voicemail Disposition Plan: Pending nephrology evaluation Consults called: Nephrology Admission status: MedSurg, abscess, with telemetry  Past Medical History:  Diagnosis Date   Anemia    Angina at rest [facility])    Angioedema  03/20/2021   Lisinopril   Anterior cerebral aneurysm    approximately 5 mm; left MCA    Anxiety    Aortic atherosclerosis (HCC)    Asthma    Blind left eye    COPD (chronic obstructive pulmonary disease) (HCC)    Coronary artery disease    Depression    DOE (dyspnea on exertion)    ESRD (end stage renal disease) ([place])    Gait instability    GERD (gastroesophageal reflux disease)    HFrEF (heart failure with reduced ejection fraction) ([place])    History of 2019 novel coronavirus disease (COVID-19) 03/2019   [place]   History of DVT (deep vein thrombosis)    Hx of CABG 2018   x 4; performed in [place]   Hyperlipidemia    Hypertension    MI (myocardial infarction) ([place]) 2018   no stents   Pituitary adenoma ([place])    Secondary hyperparathyroidism ([place])    Sleep apnea    non-compliant with nocturnal PAP therapy   Stroke [facility]) 2014   stated affected left side.   T2DM (type 2 diabetes mellitus) ([place])    Past Surgical History:  Procedure Laterality Date   AV FISTULA PLACEMENT Right 01/28/2021   Procedure: ARTERIOVENOUS (AV) FISTULA CREATION;  Surgeon: Katha Cabal, MD;  Location: [facility] ORS;  Service: Vascular;  Laterality: Right;   AV FISTULA PLACEMENT Right 05/13/2021   Procedure: INSERTION OF ARTERIOVENOUS (AV)  GORE-TEX GRAFT ARM ( BRACHIAL AXILLARY );  Surgeon: Katha Cabal, MD;  Location: [facility] ORS;  Service: Vascular;  Laterality: Right;   [place]   pituitary tumor   BREAST EXCISIONAL BIOPSY Left yrs ago   benign   CESAREAN SECTION     CORONARY ARTERY BYPASS GRAFT  2018   x 4 vessels; performed in [place]   DIALYSIS/PERMA CATHETER INSERTION N/A 11/07/2020   Procedure: DIALYSIS/PERMA CATHETER INSERTION;  Surgeon: Algernon Huxley, MD;  Location: [place] [facility];  Service: Cardiovascular;  Laterality: N/A;   EYE SURGERY Right 2021   cataracts    PERIPHERAL VASCULAR THROMBECTOMY Right 04/03/2021   Procedure: A/V Fistulagram ;   Surgeon: Katha Cabal, MD;  Location: [place] [facility];  Service: Cardiovascular;  Laterality: Right;   Social History:  reports that she quit smoking about 4 years ago. She has never used smokeless tobacco. She reports previous alcohol use. She reports previous drug use.  Allergies  Allergen Reactions   Hydralazine Hives, Shortness Of Breath, Swelling and Rash    Body aches    Kiwi Extract Hives, Swelling and Rash    Mouth swells    Lisinopril Swelling    Angioedema   Shellfish Allergy Anaphylaxis    Shrimp/lobster  Betadine leaves welts on skin     Betadine [Povidone Iodine] Hives and Rash    LEAVES WELTS ON SKIN   Metformin Diarrhea and Other (See Comments)    GI Intolerance   Tape Itching    Use paper tape whenever possible   Family History  Problem Relation Age of Onset   CAD Mother    CAD Brother    Breast cancer Sister 61   Family history: Family history reviewed and not pertinent  Prior to Admission medications   Medication Sig Start Date End Date Taking? Authorizing Provider  acetaminophen (TYLENOL) 500 MG tablet Take 500-1,000 mg by mouth every 6 (six) hours as needed for mild pain or moderate pain.   Yes [provider]  albuterol (VENTOLIN HFA) 108 (90 Base) MCG/ACT inhaler Inhale 2 puffs into the lungs every 6 (six) hours as needed for wheezing or shortness of breath.   Yes [provider]  amLODipine (NORVASC) 10 MG tablet Take 1 tablet (10 mg total) by mouth daily. 03/03/20  Yes Lorella Nimrod, MD  ammonium lactate (LAC-HYDRIN) 12 % lotion Apply 1 application topically daily.   Yes [provider]  aspirin 81 MG EC tablet Take 1 tablet (81 mg total) by mouth daily. 02/10/21  Yes Max Sane, MD  atorvastatin (LIPITOR) 80 MG tablet Take 1 tablet (80 mg total) by mouth daily at 6 PM. 11/26/19  Yes Lavina Hamman, MD  bismuth subsalicylate (PEPTO BISMOL) 262 MG/15ML suspension Take 30 mLs by mouth every 6 (six) hours as needed for  indigestion or diarrhea or loose stools.   Yes [provider]  carvedilol (COREG) 25 MG tablet Take 25 mg by mouth 2 (two) times daily.   Yes [provider]  cloNIDine (CATAPRES) 0.1 MG tablet Take 0.1 mg by mouth 2 (two) times daily. (Non-dialysis days)   Yes [provider]  fenofibrate (TRICOR) 145 MG tablet Take 145 mg by mouth daily.   Yes [provider]  furosemide (LASIX) 40 MG tablet Take 40 mg by mouth daily.   Yes [provider]  insulin aspart protamine- aspart (NOVOLOG MIX 70/30) (70-30) 100 UNIT/ML injection Inject 0.15 mLs (15 Units total)  into the skin 2 (two) times daily with a meal. 11/12/20  Yes Fritzi Mandes, MD  isosorbide mononitrate (IMDUR) 30 MG 24 hr tablet Take 30 mg by mouth daily.   Yes [provider]  loperamide (IMODIUM A-D) 2 MG tablet Take 4 mg by mouth 4 (four) times daily as needed for diarrhea or loose stools.   Yes [provider]  mirtazapine (REMERON) 30 MG tablet Take 1 tablet (30 mg total) by mouth at bedtime. 11/26/19  Yes Lavina Hamman, MD  mometasone-formoterol [facility]) 200-5 MCG/ACT AERO Inhale 2 puffs into the lungs 2 (two) times daily.   Yes [provider]  omeprazole (PRILOSEC) 40 MG capsule Take 40 mg by mouth daily. 09/22/20  Yes [provider]  polyethylene glycol (MIRALAX / GLYCOLAX) 17 g packet Take 17 g by mouth daily. Patient taking differently: Take 17 g by mouth daily as needed for mild constipation or moderate constipation. 02/11/21  Yes Lorella Nimrod, MD  potassium chloride (KLOR-CON) 10 MEQ tablet Take 10 mEq by mouth daily.   Yes [provider]  topiramate (TOPAMAX) 25 MG tablet Take 25 mg by mouth daily. 08/19/20  Yes [provider]  traZODone (DESYREL) 50 MG tablet Take 50 mg by mouth at bedtime.   Yes [provider]  vitamin B-12 (CYANOCOBALAMIN) 1000 MCG tablet Take 1,000 mcg by mouth daily.   Yes [provider]   HYDROcodone-acetaminophen (NORCO) 5-325 MG tablet Take 1-2 tablets by mouth every 6 (six) hours as needed for moderate pain or severe pain. Patient not taking: No sig reported 05/13/21   Katha Cabal, MD   Physical Exam: Vitals:   06/11/21 0930 06/11/21 1000 06/11/21 1030 06/11/21 1130  BP: (!) 151/79 (!) 159/81 (!) 157/88 (!) 163/63  Pulse: 64 62 63 63  Resp:   (!) 26 (!) 23  Temp:      TempSrc:      SpO2: 100% 100% 100% 100%  Weight:      Height:       Constitutional: appears older than chronological age, NAD, calm, comfortable Eyes: Left eye blindness, lids and conjunctivae normal ENMT: Mucous membranes are moist. Posterior pharynx clear of any exudate or lesions. Age-appropriate dentition. Hearing appropriate Neck: normal, supple, no masses, no thyromegaly Respiratory: clear to auscultation bilaterally, no wheezing, no crackles. Normal respiratory effort. No accessory muscle use.  Cardiovascular: Regular rate and rhythm, no murmurs / rubs / gallops. 2+ bilateral pitting edema. 2+ pedal pulses. No carotid bruits.  Abdomen: Obese abdomen, no tenderness, no masses palpated, no hepatosplenomegaly. Bowel sounds positive.  Musculoskeletal: no clubbing / cyanosis. No joint deformity upper and lower extremities. Good ROM, no contractures, no atrophy. Normal muscle tone.  Skin: no rashes, lesions, ulcers. No induration Neurologic: Sensation intact. Strength 5/5 in all 4.  Psychiatric: Normal judgment and insight. Alert and oriented x 3.  Flat affect  EKG: independently reviewed, showing sinus rhythm with rate of 64, qtc 472  Chest x-ray on Admission: I personally reviewed and I agree with radiologist reading as below.  DG Chest 2 View  Result Date: 06/11/2021 CLINICAL DATA:  Shortness of breath.  Back pain. EXAM: CHEST - 2 VIEW COMPARISON:  02/09/2021. FINDINGS: Right IJ line with tip again noted over the right atrium in unchanged position. Prior CABG. Stable cardiomegaly. Diffuse  bilateral interstitial prominence and small bilateral pleural effusions. Findings suggest mild CHF. Similar findings noted on prior exam. No pneumothorax. IMPRESSION: 1. Right IJ line with tip again noted  over the right atrium in unchanged position. 2. Prior CABG. Stable cardiomegaly. Diffuse bilateral interstitial prominence and small bilateral pleural effusions consistent with mild CHF. Similar findings noted on prior exam. Electronically Signed   By: Marcello Moores  Register   On: 06/11/2021 09:02    Labs on Admission: I have personally reviewed following labs  CBC: Recent Labs  Lab 06/11/21 0844  WBC 7.6  HGB 8.6*  HCT 26.4*  MCV 88.0  PLT 719   Basic Metabolic Panel: Recent Labs  Lab 06/11/21 0844  NA 135  K 2.9*  CL 95*  CO2 29  GLUCOSE 324*  BUN 17  CREATININE 4.23*  CALCIUM 7.8*   GFR: Estimated Creatinine Clearance: 15.2 mL/min (A) (by C-G formula based on SCr of 4.23 mg/dL (H)).  Urine analysis:    Component Value Date/Time   COLORURINE STRAW (A) 10/21/2020 0829   APPEARANCEUR CLEAR (A) 10/21/2020 0829   LABSPEC 1.007 10/21/2020 0829   PHURINE 7.0 10/21/2020 0829   GLUCOSEU 150 (A) 10/21/2020 0829   HGBUR NEGATIVE 10/21/2020 0829   BILIRUBINUR NEGATIVE 10/21/2020 0829   KETONESUR NEGATIVE 10/21/2020 0829   PROTEINUR >=300 (A) 10/21/2020 0829   NITRITE NEGATIVE 10/21/2020 0829   LEUKOCYTESUR SMALL (A) 10/21/2020 0829   Dr. Tobie Poet [facility]  If 7PM-7AM, please contact overnight-coverage provider If 7AM-7PM, please contact day coverage provider [email or web address]  06/11/2021, 12:41 PM

## 2021-06-11 NOTE — ED Notes (Signed)
Pt SOB when this RN entered room. Pt states that this normally happens when her potassium is low. Pt reassured that her oxygen sats where normal and we are awaiting her lab results and that the EDP will be in shortly. Blanket provided for pt. Call light in reach.

## 2021-06-12 ENCOUNTER — Ambulatory Visit
Admission: EM | Disposition: A | Discharge status: Home / Self Care | Payer: Self-pay | Source: Home / Self Care | Attending: Emergency Medicine

## 2021-06-12 DIAGNOSIS — E785 Hyperlipidemia, unspecified: Secondary | ICD-10-CM | POA: Diagnosis not present

## 2021-06-12 DIAGNOSIS — N186 End stage renal disease: Secondary | ICD-10-CM | POA: Diagnosis not present

## 2021-06-12 DIAGNOSIS — D631 Anemia in chronic kidney disease: Secondary | ICD-10-CM | POA: Diagnosis not present

## 2021-06-12 DIAGNOSIS — N2581 Secondary hyperparathyroidism of renal origin: Secondary | ICD-10-CM | POA: Diagnosis not present

## 2021-06-12 DIAGNOSIS — I25118 Atherosclerotic heart disease of native coronary artery with other forms of angina pectoris: Secondary | ICD-10-CM | POA: Diagnosis not present

## 2021-06-12 DIAGNOSIS — E1159 Type 2 diabetes mellitus with other circulatory complications: Secondary | ICD-10-CM | POA: Diagnosis not present

## 2021-06-12 DIAGNOSIS — I152 Hypertension secondary to endocrine disorders: Secondary | ICD-10-CM | POA: Diagnosis not present

## 2021-06-12 DIAGNOSIS — Z452 Encounter for adjustment and management of vascular access device: Secondary | ICD-10-CM | POA: Diagnosis not present

## 2021-06-12 DIAGNOSIS — I5020 Unspecified systolic (congestive) heart failure: Secondary | ICD-10-CM | POA: Diagnosis not present

## 2021-06-12 DIAGNOSIS — I120 Hypertensive chronic kidney disease with stage 5 chronic kidney disease or end stage renal disease: Secondary | ICD-10-CM | POA: Diagnosis not present

## 2021-06-12 DIAGNOSIS — J810 Acute pulmonary edema: Secondary | ICD-10-CM

## 2021-06-12 DIAGNOSIS — E8779 Other fluid overload: Secondary | ICD-10-CM | POA: Diagnosis not present

## 2021-06-12 DIAGNOSIS — E1169 Type 2 diabetes mellitus with other specified complication: Secondary | ICD-10-CM | POA: Diagnosis not present

## 2021-06-12 HISTORY — PX: DIALYSIS/PERMA CATHETER REMOVAL: CATH118289

## 2021-06-12 LAB — GLUCOSE, CAPILLARY
Glucose-Capillary: 283 mg/dL — ABNORMAL HIGH (ref 70–99)
Glucose-Capillary: 174 mg/dL — ABNORMAL HIGH (ref 70–99)
Glucose-Capillary: 232 mg/dL — ABNORMAL HIGH (ref 70–99)
Glucose-Capillary: 219 mg/dL — ABNORMAL HIGH (ref 70–99)

## 2021-06-12 LAB — CBC
HCT: 25.9 % — ABNORMAL LOW (ref 36.0–46.0)
Hemoglobin: 8.2 g/dL — ABNORMAL LOW (ref 12.0–15.0)
MCH: 28.4 pg (ref 26.0–34.0)
MCHC: 31.7 g/dL (ref 30.0–36.0)
MCV: 89.6 fL (ref 80.0–100.0)
Platelets: 259 10*3/uL (ref 150–400)
RBC: 2.89 MIL/uL — ABNORMAL LOW (ref 3.87–5.11)
RDW: 15 % (ref 11.5–15.5)
WBC: 7.3 10*3/uL (ref 4.0–10.5)
nRBC: 0 % (ref 0.0–0.2)

## 2021-06-12 LAB — BASIC METABOLIC PANEL
Anion gap: 8 (ref 5–15)
BUN: 18 mg/dL (ref 6–20)
CO2: 30 mmol/L (ref 22–32)
Calcium: 8.1 mg/dL — ABNORMAL LOW (ref 8.9–10.3)
Chloride: 98 mmol/L (ref 98–111)
Creatinine, Ser: 3.94 mg/dL — ABNORMAL HIGH (ref 0.44–1.00)
GFR, Estimated: 13 mL/min — ABNORMAL LOW (ref >60)
Glucose, Bld: 318 mg/dL — ABNORMAL HIGH (ref 70–99)
Potassium: 3.9 mmol/L (ref 3.5–5.1)
Sodium: 136 mmol/L (ref 135–145)

## 2021-06-12 LAB — HEMOGLOBIN A1C
Hgb A1c MFr Bld: 9.6 % — ABNORMAL HIGH (ref 4.8–5.6)
Mean Plasma Glucose: 229 mg/dL

## 2021-06-12 LAB — BRAIN NATRIURETIC PEPTIDE: B Natriuretic Peptide: 3501.7 pg/mL — ABNORMAL HIGH (ref 0.0–100.0)

## 2021-06-12 SURGERY — DIALYSIS/PERMA CATHETER REMOVAL
Anesthesia: LOCAL

## 2021-06-12 MED ORDER — COVID-19 MRNA VAC-TRIS(PFIZER) 30 MCG/0.3ML IM SUSP
0.3000 mL | Freq: Once | INTRAMUSCULAR | Status: AC
  Filled 2021-06-12: qty 0.3

## 2021-06-12 MED FILL — Insulin Aspart Prot & Aspart (Human) Inj 100 Unit/ML (70-30): SUBCUTANEOUS | Qty: 0.15 | Status: AC

## 2021-06-12 SURGICAL SUPPLY — 2 items
FORCEPS HALSTEAD CVD 5IN STRL (INSTRUMENTS) ×1 IMPLANT
TRAY LACERAT/PLASTIC (MISCELLANEOUS) ×1 IMPLANT

## 2021-06-12 NOTE — H&P (View-Only) (Signed)
[facility] Admission History & Physical  MRN : [id number]  Terry Arroyo is a 60 y.o. ([birth year]) female who presents with chief complaint of  Chief Complaint  Patient presents with   Shortness of Breath   History of Present Illness:  I am asked to evaluate the patient by the nephrology service. The patient has a tunneled catheter and a functioning upper extremity dialysis access.    The patient reports they're not been any problems with any of their dialysis runs. They are reporting good flows with good parameters at dialysis. Patient denies pain or tenderness overlying the access.  There is no pain with dialysis.  The patient denies hand pain or finger pain consistent with steal syndrome.  No fevers or chills while on dialysis.   Current Facility-Administered Medications  Medication Dose Route Frequency Provider Last Rate Last Admin   acetaminophen (TYLENOL) tablet 650 mg  650 mg Oral Q6H PRN Cox, Amy N, DO       Or   acetaminophen (TYLENOL) suppository 650 mg  650 mg Rectal Q6H PRN Cox, Amy N, DO       amLODipine (NORVASC) tablet 10 mg  10 mg Oral Daily Cox, Amy N, DO   10 mg at 06/11/21 1802   aspirin EC tablet 81 mg  81 mg Oral Daily Cox, Amy N, DO   81 mg at 06/11/21 1802   atorvastatin (LIPITOR) tablet 80 mg  80 mg Oral q1800 Cox, Amy N, DO   80 mg at 06/11/21 1802   carvedilol (COREG) tablet 25 mg  25 mg Oral BID Cox, Amy N, DO   25 mg at 06/11/21 2141   Chlorhexidine Gluconate Cloth 2 % PADS 6 each  6 each Topical Q0600 Cox, Amy N, DO   6 each at 06/12/21 0998   cloNIDine (CATAPRES) tablet 0.1 mg  0.1 mg Oral BID Cox, Amy N, DO       [START ON 06/13/2021] COVID-19 mRNA Vac-TriS (Pfizer) injection 0.3 mL  0.3 mL Intramuscular ONCE-1600 Alma Friendly, MD       fenofibrate tablet 160 mg  160 mg Oral Daily Cox, Amy N, DO   160 mg at 06/12/21 0907   heparin injection 5,000 Units  5,000 Units Subcutaneous Q8H Cox, Amy N, DO   5,000 Units at 06/12/21  0534   insulin aspart (novoLOG) injection 0-5 Units  0-5 Units Subcutaneous QHS Cox, Amy N, DO       insulin aspart (novoLOG) injection 0-6 Units  0-6 Units Subcutaneous TID WC Cox, Amy N, DO   3 Units at 06/12/21 0911   insulin aspart protamine- aspart (NOVOLOG MIX 70/30) injection 15 Units  15 Units Subcutaneous BID WC Cox, Amy N, DO   15 Units at 06/12/21 0911   isosorbide mononitrate (IMDUR) 24 hr tablet 30 mg  30 mg Oral Daily Cox, Amy N, DO       lidocaine (LIDODERM) 5 % 2 patch  2 patch Transdermal Q24H Cox, Amy N, DO   2 patch at 06/11/21 2144   mirtazapine (REMERON) tablet 30 mg  30 mg Oral QHS Cox, Amy N, DO   30 mg at 06/11/21 2142   ondansetron (ZOFRAN) tablet 4 mg  4 mg Oral Q6H PRN Cox, Amy N, DO       Or   ondansetron (ZOFRAN) injection 4 mg  4 mg Intravenous Q6H PRN Cox, Amy N, DO       pantoprazole (PROTONIX) EC tablet 80  mg  80 mg Oral Daily Cox, Amy N, DO   80 mg at 06/12/21 5809   spironolactone (ALDACTONE) tablet 25 mg  25 mg Oral Daily Kolluru, Sarath, MD   25 mg at 06/11/21 1802   topiramate (TOPAMAX) tablet 25 mg  25 mg Oral Daily Cox, Amy N, DO   25 mg at 06/12/21 9833   traZODone (DESYREL) tablet 50 mg  50 mg Oral QHS Cox, Amy N, DO   50 mg at 06/11/21 2141   vitamin B-12 (CYANOCOBALAMIN) tablet 1,000 mcg  1,000 mcg Oral Daily Cox, Amy N, DO   1,000 mcg at 06/12/21 8250   Past Medical History:  Diagnosis Date   Anemia    Angina at rest [facility])    Angioedema 03/20/2021   Lisinopril   Anterior cerebral aneurysm    approximately 5 mm; left MCA    Anxiety    Aortic atherosclerosis (HCC)    Asthma    Blind left eye    COPD (chronic obstructive pulmonary disease) (HCC)    Coronary artery disease    Depression    DOE (dyspnea on exertion)    ESRD (end stage renal disease) (HCC)    Gait instability    GERD (gastroesophageal reflux disease)    HFrEF (heart failure with reduced ejection fraction) ([place])    History of 2019 novel coronavirus disease (COVID-19) 03/2019    [facility] [place]   History of DVT (deep vein thrombosis)    Hx of CABG 2018   x 4; performed in [place]   Hyperlipidemia    Hypertension    MI (myocardial infarction) ([place]) 2018   no stents   Pituitary adenoma ([place])    Secondary hyperparathyroidism ([place])    Sleep apnea    non-compliant with nocturnal PAP therapy   Stroke [facility]) 2014   stated affected left side.   T2DM (type 2 diabetes mellitus) ([place])    Past Surgical History:  Procedure Laterality Date   AV FISTULA PLACEMENT Right 01/28/2021   Procedure: ARTERIOVENOUS (AV) FISTULA CREATION;  Surgeon: Katha Cabal, MD;  Location: [facility] ORS;  Service: Vascular;  Laterality: Right;   AV FISTULA PLACEMENT Right 05/13/2021   Procedure: INSERTION OF ARTERIOVENOUS (AV) GORE-TEX GRAFT ARM ( BRACHIAL AXILLARY );  Surgeon: Katha Cabal, MD;  Location: [facility] ORS;  Service: Vascular;  Laterality: Right;   [place]   pituitary tumor   BREAST EXCISIONAL BIOPSY Left yrs ago   benign   CESAREAN SECTION     CORONARY ARTERY BYPASS GRAFT  2018   x 4 vessels; performed in [place] N/A 11/07/2020   Procedure: DIALYSIS/PERMA CATHETER INSERTION;  Surgeon: Algernon Huxley, MD;  Location: [place] [facility];  Service: Cardiovascular;  Laterality: N/A;   EYE SURGERY Right 2021   cataracts    PERIPHERAL VASCULAR THROMBECTOMY Right 04/03/2021   Procedure: A/V Fistulagram ;  Surgeon: Katha Cabal, MD;  Location: [place] [facility];  Service: Cardiovascular;  Laterality: Right;   Social History Social History   Tobacco Use   Smoking status: Former    Pack years: 0.00    Types: Cigarettes    Quit date: 2018    Years since quitting: 4.4   Smokeless tobacco: Never  Vaping Use   Vaping Use: Never used  Substance Use Topics   Alcohol use: Not Currently   Drug use: Not Currently   Family History Family History  Problem Relation Age of Onset   CAD Mother    CAD  Brother    Breast cancer Sister 71  No family history of bleeding or clotting disorders, autoimmune disease or porphyria.  Allergies  Allergen Reactions   Hydralazine Hives, Shortness Of Breath, Swelling and Rash    Body aches    Kiwi Extract Hives, Swelling and Rash    Mouth swells    Lisinopril Swelling    Angioedema   Shellfish Allergy Anaphylaxis    Shrimp/lobster  Betadine leaves welts on skin     Betadine [Povidone Iodine] Hives and Rash    LEAVES WELTS ON SKIN   Metformin Diarrhea and Other (See Comments)    GI Intolerance   Tape Itching    Use paper tape whenever possible   REVIEW OF SYSTEMS (Negative unless checked)  Constitutional: [] Weight loss  [] Fever  [] Chills Cardiac: [] Chest pain   [] Chest pressure   [] Palpitations   [] Shortness of breath when laying flat   [] Shortness of breath at rest   [x] Shortness of breath with exertion. Vascular:  [] Pain in legs with walking   [] Pain in legs at rest   [] Pain in legs when laying flat   [] Claudication   [] Pain in feet when walking  [] Pain in feet at rest  [] Pain in feet when laying flat   [] History of DVT   [] Phlebitis   [] Swelling in legs   [] Varicose veins   [] Non-healing ulcers Pulmonary:   [] Uses home oxygen   [] Productive cough   [] Hemoptysis   [] Wheeze  [] COPD   [] Asthma Neurologic:  [] Dizziness  [] Blackouts   [] Seizures   [] History of stroke   [] History of TIA  [] Aphasia   [] Temporary blindness   [] Dysphagia   [] Weakness or numbness in arms   [] Weakness or numbness in legs Musculoskeletal:  [] Arthritis   [] Joint swelling   [] Joint pain   [] Low back pain Hematologic:  [] Easy bruising  [] Easy bleeding   [] Hypercoagulable state   [] Anemic  [] Hepatitis Gastrointestinal:  [] Blood in stool   [] Vomiting blood  [] Gastroesophageal reflux/heartburn   [] Difficulty swallowing. Genitourinary:  [x] Chronic kidney disease   [] Difficult urination  [] Frequent urination  [] Burning with urination   [] Blood in urine Skin:  [] Rashes   [] Ulcers    [] Wounds Psychological:  [] History of anxiety   []  History of major depression.  Physical Examination  Vitals:   06/12/21 0433 06/12/21 0757 06/12/21 0937 06/12/21 0956  BP: 130/62 134/63 (!) 149/72 (!) 149/72  Pulse: 61 (!) 58  68  Resp: 20 18  20   Temp: 98.6 F (37 C) 98.3 F (36.8 C)  98.1 F (36.7 C)  TempSrc: Oral Oral  Oral  SpO2: 99% 100% 100% 100%  Weight:      Height:       Body mass index is 27.44 kg/m. Gen: WD/WN, NAD Head: [place]/AT, No temporalis wasting. Prominent temp pulse not noted. Ear/Nose/Throat: Hearing grossly intact, nares w/o erythema or drainage, oropharynx w/o Erythema/Exudate,  Eyes: Conjunctiva clear, sclera non-icteric Neck: Trachea midline.  No JVD.  Pulmonary:  Good air movement, respirations not labored, no use of accessory muscles.  Cardiac: RRR, normal S1, S2. Vascular:  Vessel Right Left  Radial Palpable Palpable  Ulnar Not Palpable Not Palpable  Brachial Palpable Palpable  Carotid Palpable, without bruit Palpable, without bruit  Gastrointestinal: soft, non-tender/non-distended. No guarding/reflex.  Musculoskeletal: M/S 5/5 throughout.  Extremities without ischemic changes.  No deformity or atrophy.  Neurologic: Sensation grossly intact in extremities.  Symmetrical.  Speech is fluent. Motor exam as listed above. Psychiatric: Judgment intact, Mood & affect appropriate for pt's clinical situation. Dermatologic: No rashes or ulcers noted.  No cellulitis or open wounds. Lymph : No Cervical, Axillary, or Inguinal lymphadenopathy.  CBC Lab Results  Component Value Date   WBC 7.3 06/12/2021   HGB 8.2 (L) 06/12/2021   HCT 25.9 (L) 06/12/2021   MCV 89.6 06/12/2021   PLT 259 06/12/2021   BMET    Component Value Date/Time   NA 136 06/12/2021 0644   NA 134 (L) 11/18/2013 0138   K 3.9 06/12/2021 0644   K 3.8 11/18/2013 0138   CL 98 06/12/2021 0644   CL 98 11/18/2013 0138   CO2 30 06/12/2021 0644   CO2 29 11/18/2013 0138   GLUCOSE 318  (H) 06/12/2021 0644   GLUCOSE 348 (H) 11/18/2013 0138   BUN 18 06/12/2021 0644   BUN 11 11/18/2013 0138   CREATININE 3.94 (H) 06/12/2021 0644   CREATININE 0.87 11/18/2013 0138   CALCIUM 8.1 (L) 06/12/2021 0644   CALCIUM 9.4 11/18/2013 0138   GFRNONAA 13 (L) 06/12/2021 0644   GFRNONAA >60 11/18/2013 0138   GFRAA 18 (L) 09/15/2020 0403   GFRAA >60 11/18/2013 0138   Estimated Creatinine Clearance: 16.3 mL/min (A) (by C-G formula based on SCr of 3.94 mg/dL (H)).  COAG Lab Results  Component Value Date   INR 1.0 05/07/2021   INR 1.1 01/20/2021   INR 1.0 09/11/2020   Radiology DG Chest 2 View  Result Date: 06/11/2021 CLINICAL DATA:  Shortness of breath.  Back pain. EXAM: CHEST - 2 VIEW COMPARISON:  02/09/2021. FINDINGS: Right IJ line with tip again noted over the right atrium in unchanged position. Prior CABG. Stable cardiomegaly. Diffuse bilateral interstitial prominence and small bilateral pleural effusions. Findings suggest mild CHF. Similar findings noted on prior exam. No pneumothorax. IMPRESSION: 1. Right IJ line with tip again noted over the right atrium in unchanged position. 2. Prior CABG. Stable cardiomegaly. Diffuse bilateral interstitial prominence and small bilateral pleural effusions consistent with mild CHF. Similar findings noted on prior exam. Electronically Signed   By: [place]   On: 06/11/2021 09:02   VAS US DUPLEX DIALYSIS ACCESS (AVF, AVG)  Result Date: 06/04/2021 DIALYSIS ACCESS Patient Name:  Terry Arroyo  Date of Exam:   06/04/2021 Medical Rec #: [id number]        Accession #:    [id number] Date of Birth: [birth year]         Patient Gender: F Patient Age:   40Y Exam Location:  [facility] Procedure:      VAS US DUPLEX DIALYSIS ACCESS (AVF, AVG) Referring Phys: [id number] [place] --------------------------------------------------------------------------------  History: Created 01/28/2021          5/25 Thrombectomy of avg. Performing Technologist:  Concha Norway RVT  Examination Guidelines: A complete evaluation includes B-mode imaging, spectral Doppler, color Doppler, and power Doppler as needed of all accessible portions of each vessel. Unilateral testing is considered an integral part of a complete examination. Limited examinations for reoccurring indications may be performed as noted.  Findings:   +--------------------+----------+-----------------+--------+ AVG                 PSV (cm/s)Flow Vol (mL/min)Describe +--------------------+----------+-----------------+--------+ Native artery inflow   267          2473                +--------------------+----------+-----------------+--------+ Arterial anastomosis   437                              +--------------------+----------+-----------------+--------+  Prox graft             201                              +--------------------+----------+-----------------+--------+ Mid graft              144                              +--------------------+----------+-----------------+--------+ Distal graft           155                              +--------------------+----------+-----------------+--------+ Venous anastomosis     182                              +--------------------+----------+-----------------+--------+ Venous outflow         102                              +--------------------+----------+-----------------+--------+ +--------------+------------+----------+---------+---------+-------------------+                 Diameter  Depth (cm)Branching   PSV       Flow Volume                       (cm)                        (cm/s)       (ml/min)       +--------------+------------+----------+---------+---------+-------------------+ Rt Rad Art Dis                                  40                        +--------------+------------+----------+---------+---------+-------------------+ Antegrade                                                                  +--------------+------------+----------+---------+---------+-------------------+  Summary: Widely patent right BrachAx AVG s/p thrombectomy.  *See table(s) above for measurements and observations.  Diagnosing physician: [name] MD Electronically signed by [name] MD on 06/04/2021 at 4:41:00 PM.   --------------------------------------------------------------------------------   Final     Assessment/Plan  1.  End-stage renal disease requiring hemodialysis:  The patient has an extremity access that is functioning well. Therefore, the patient will undergo removal of the tunneled catheter under local anesthesia.  The risks and benefits were described to the patient.  All questions were answered.  The patient agrees to proceed with angiography and intervention. Patient will continue dialysis therapy without further interruption if a successful intervention is not achieved then a tunneled catheter will be placed. Dialysis has already been arranged. 2.  Hypertension:  Patient will continue medical management; nephrology is following no changes in oral medications. 3. Diabetes mellitus:  Glucose will be monitored and oral medications been held this morning once the patient has undergone  the patient's procedure po intake will be reinitiated and again Accu-Cheks will be used to assess the blood glucose level and treat as needed. The patient will be restarted on the patient's usual hypoglycemic regime 4.  Coronary artery disease:  EKG will be monitored. Nitrates will be used if needed. The patient's oral cardiac medications will be continued.  Discussed with Dr. Mayme Genta, PA-C  06/12/2021 12:30 PM

## 2021-06-12 NOTE — Progress Notes (Signed)
This patient arrived pre Hd, reported feeling tired, denies pain, vital signs stable on arrival.

## 2021-06-12 NOTE — Progress Notes (Signed)
Mobility Specialist - Progress Note   06/12/21 1432  Mobility  Activity Off unit  Mobility performed by Mobility specialist    Pt still off unit for HD tx at time of attempt. Will attempt session another date/time as pt becomes available.    Kathee Delton Mobility Specialist 06/12/21, 2:33 PM

## 2021-06-12 NOTE — Progress Notes (Addendum)
[facility]  ROUNDING NOTE   Subjective:   Ms. DELMI FULFER was admitted to [facility] on 06/11/2021 for Hypokalemia [E87.6] Acute pulmonary edema ([place]) [J81.0] ESRD (end stage renal disease) ([place]) [N18.6] Fluid overload [E87.70]  Last hemodialysis treatment was yesterday. She had 1.6 liters removed.   Denies shortness of breath today Complains of fatigue Patient seen later during dialysis   HEMODIALYSIS FLOWSHEET:  Blood Flow Rate (mL/min): 150 mL/min Arterial Pressure (mmHg): -80 mmHg Venous Pressure (mmHg): 70 mmHg Transmembrane Pressure (mmHg): 20 mmHg Ultrafiltration Rate (mL/min): 70 mL/min Dialysate Flow Rate (mL/min): 600 ml/min Conductivity: Machine : 13.6 Conductivity: Machine : 13.6 Dialysis Fluid Bolus: Normal Saline Bolus Amount (mL): 250 mL    Objective:  Vital signs in last 24 hours:  Temp:  [98.1 F (36.7 C)-98.6 F (37 C)] 98.1 F (36.7 C) (06/24 0956) Pulse Rate:  [58-70] 68 (06/24 0956) Resp:  [14-22] 20 (06/24 1315) BP: (123-160)/(59-79) 134/71 (06/24 1315) SpO2:  [95 %-100 %] 100 % (06/24 0956)  Weight change:  Filed Weights   06/11/21 0813  Weight: 77.1 kg    Intake/Output: I/O last 3 completed shifts: In: 240 [P.O.:240] Out: 2001 [Other:2001]   Intake/Output this shift:  Total I/O In: -  Out: 1660 [Other:1660]  Physical Exam: General: NAD, resting comfortable  Head: Normocephalic, atraumatic. Moist oral mucosal membranes  Eyes: Anicteric  Lungs:  Clear bilaterally, normal breathing effort  Heart: Regular rate and rhythm  Abdomen:  Soft, nontender  Extremities:  + peripheral edema.  Neurologic: Nonfocal, moving all four extremities  Skin: No lesions  Access: Right AVG, Right IJ permcath    Basic Metabolic Panel: Recent Labs  Lab 06/11/21 0844 06/12/21 0644  NA 135 136  K 2.9* 3.9  CL 95* 98  CO2 29 30  GLUCOSE 324* 318*  BUN 17 18  CREATININE 4.23* 3.94*  CALCIUM 7.8* 8.1*     Liver Function  Tests: No results for input(s): AST, ALT, ALKPHOS, BILITOT, PROT, ALBUMIN in the last 168 hours. No results for input(s): LIPASE, AMYLASE in the last 168 hours. No results for input(s): AMMONIA in the last 168 hours.  CBC: Recent Labs  Lab 06/11/21 0844 06/12/21 0644  WBC 7.6 7.3  HGB 8.6* 8.2*  HCT 26.4* 25.9*  MCV 88.0 89.6  PLT 255 259     Cardiac Enzymes: No results for input(s): CKTOTAL, CKMB, CKMBINDEX, TROPONINI in the last 168 hours.  BNP: Invalid input(s): POCBNP  CBG: Recent Labs  Lab 06/11/21 1755 06/11/21 2137 06/12/21 0818 06/12/21 1230  GLUCAP 163* 173* 283* 174*    Microbiology: Results for orders placed or performed during the hospital encounter of 06/11/21  Resp Panel by RT-PCR (Flu A&B, Covid) Nasopharyngeal Swab     Status: None   Collection Time: 06/11/21 10:28 AM   Specimen: Nasopharyngeal Swab; Nasopharyngeal(NP) swabs in vial transport medium  Result Value Ref Range Status   SARS Coronavirus 2 by RT PCR NEGATIVE NEGATIVE Final    Comment: (NOTE) SARS-CoV-2 target nucleic acids are NOT DETECTED.  The SARS-CoV-2 RNA is generally detectable in upper respiratory specimens during the acute phase of infection. The lowest concentration of SARS-CoV-2 viral copies this assay can detect is 138 copies/mL. A negative result does not preclude SARS-Cov-2 infection and should not be used as the sole basis for treatment or other patient management decisions. A negative result may occur with  improper specimen collection/handling, submission of specimen other than nasopharyngeal swab, presence of viral mutation(s) within the areas  targeted by this assay, and inadequate number of viral copies(<138 copies/mL). A negative result must be combined with clinical observations, patient history, and epidemiological information. The expected result is Negative.  Fact Sheet for Patients:  EntrepreneurPulse.com.au  Fact Sheet for Healthcare  Providers:  IncredibleEmployment.be  This test is no t yet approved or cleared by the [place] FDA and  has been authorized for detection and/or diagnosis of SARS-CoV-2 by FDA under an Emergency Use Authorization (EUA). This EUA will remain  in effect (meaning this test can be used) for the duration of the COVID-19 declaration under Section 564(b)(1) of the Act, 21 U.S.C.section 360bbb-3(b)(1), unless the authorization is terminated  or revoked sooner.       Influenza A by PCR NEGATIVE NEGATIVE Final   Influenza B by PCR NEGATIVE NEGATIVE Final    Comment: (NOTE) The Xpert Xpress SARS-CoV-2/FLU/RSV plus assay is intended as an aid in the diagnosis of influenza from Nasopharyngeal swab specimens and should not be used as a sole basis for treatment. Nasal washings and aspirates are unacceptable for Xpert Xpress SARS-CoV-2/FLU/RSV testing.  Fact Sheet for Patients: EntrepreneurPulse.com.au  Fact Sheet for Healthcare Providers: IncredibleEmployment.be  This test is not yet approved or cleared by the [place] FDA and has been authorized for detection and/or diagnosis of SARS-CoV-2 by FDA under an Emergency Use Authorization (EUA). This EUA will remain in effect (meaning this test can be used) for the duration of the COVID-19 declaration under Section 564(b)(1) of the Act, 21 U.S.C. section 360bbb-3(b)(1), unless the authorization is terminated or revoked.  Performed at [facility], [address] [place], [place] [address]     Coagulation Studies: No results for input(s): LABPROT, INR in the last 72 hours.  Urinalysis: No results for input(s): COLORURINE, LABSPEC, PHURINE, GLUCOSEU, HGBUR, BILIRUBINUR, KETONESUR, PROTEINUR, UROBILINOGEN, NITRITE, LEUKOCYTESUR in the last 72 hours.  Invalid input(s): APPERANCEUR    Imaging: DG Chest 2 View  Result Date: 06/11/2021 CLINICAL DATA:  Shortness of  breath.  Back pain. EXAM: CHEST - 2 VIEW COMPARISON:  02/09/2021. FINDINGS: Right IJ line with tip again noted over the right atrium in unchanged position. Prior CABG. Stable cardiomegaly. Diffuse bilateral interstitial prominence and small bilateral pleural effusions. Findings suggest mild CHF. Similar findings noted on prior exam. No pneumothorax. IMPRESSION: 1. Right IJ line with tip again noted over the right atrium in unchanged position. 2. Prior CABG. Stable cardiomegaly. Diffuse bilateral interstitial prominence and small bilateral pleural effusions consistent with mild CHF. Similar findings noted on prior exam. Electronically Signed   By: [place]   On: 06/11/2021 09:02     Medications:     amLODipine  10 mg Oral Daily   aspirin EC  81 mg Oral Daily   atorvastatin  80 mg Oral q1800   carvedilol  25 mg Oral BID   Chlorhexidine Gluconate Cloth  6 each Topical Q0600   cloNIDine  0.1 mg Oral BID   [START ON 06/13/2021] COVID-19 mRNA Vac-TriS (Pfizer)  0.3 mL Intramuscular ONCE-1600   fenofibrate  160 mg Oral Daily   heparin  5,000 Units Subcutaneous Q8H   insulin aspart  0-5 Units Subcutaneous QHS   insulin aspart  0-6 Units Subcutaneous TID WC   insulin aspart protamine- aspart  15 Units Subcutaneous BID WC   isosorbide mononitrate  30 mg Oral Daily   lidocaine  2 patch Transdermal Q24H   mirtazapine  30 mg Oral QHS   pantoprazole  80 mg Oral Daily  spironolactone  25 mg Oral Daily   topiramate  25 mg Oral Daily   traZODone  50 mg Oral QHS   vitamin B-12  1,000 mcg Oral Daily     Assessment/ Plan:  Ms. FAITHLYNN DEELEY is a 60 y.o. black female with end stage renal disease on hemodialysis, hypertension, angioedema to ARB/ACE-I, COPD/asthma, diabetes mellitus type II, diabetic retinopathy with left eye blindness, coronary artery disease, depression, congestive heart failure status post CABG, DVT, hyperlipidemia, CVA who is admitted to [facility] on 06/11/2021 for Hypokalemia  [E87.6] Acute pulmonary edema ([place]) [J81.0] ESRD (end stage renal disease) ([place]) [N18.6] Fluid overload [E87.70]  [facility] MWF [facility] Right AVG 79kg  End Stage Renal Disease: hemodialysis Received dialysis yesterday. Tolerated well. Received dialysis today. Complained of cramping and was given a 248ml bolus. AVG used successfully for this treatment. Will consult vascular to remove CVC  Hypokalemia: continue potassium chloride. Start spironolactone on this admission. Hold furosemide.   Hypertension: elevated and fluid driven, 785/88. Home regimen of amlodipine, carvedilol, furosemide and isosorbide mononitrate. Holding furosemide.   Secondary Hyperparathyroidism:  not currently on binders.   Anemia with chronic kidney disease: hemoglobin 8.2, normocytic. EPO with MWF HD treatments.    LOS: 0 Aldine Grainger 6/24/20222:02 PM

## 2021-06-12 NOTE — Progress Notes (Signed)
PROGRESS NOTE  Terry Arroyo LHT:[id number] DOB: 10-24-1961 DOA: 06/11/2021 PCP: Kirk Ruths, MD  HPI/Recap of past 24 hours: Terry Arroyo is a 60 y.o. female with medical history significant for CAD status post CABG 2018, HTN, HLD, ESRD M/W/F presents to the ED with chief concerns of shortness of breath. She reports that she had her full dose of dialysis session on 06/10/2021, felt better initially, but worsened. She reports that the SOB has been gradually worsening over the last three weeks, has not been able to lay down to sleep at night and has been sitting up in a chair to sleep. Pt states that she still makes urine about 3 times a day. In the ED, vital signs stable, including saturating about 97% on room air.  Labs fairly unremarkable.  Troponin 15--> 14.  COVID/influenza were negative.  Patient admitted for further management    Today, patient continues to report shortness of breath, with chest tightness.  Patient denies any left-sided chest pain, nausea/vomiting, abdominal pain, fever/chills.  Assessment/Plan: Principal Problem:   Fluid overload Active Problems:   Hyperglycemia   Hypertension associated with diabetes (HCC)   HFrEF (heart failure with reduced ejection fraction) (HCC)   CAD (coronary artery disease)   Hyperlipidemia associated with type 2 diabetes mellitus ([place])   ESRD (end stage renal disease) (HCC)   Anemia in chronic kidney disease   Blind left eye   Diabetic peripheral angiopathy (HCC)   DVT (deep venous thrombosis) (HCC)   Depression   Mixed hyperlipidemia   Hypokalemia   Fluid overload in setting of ESRD on HD- M/W/F (still making urine) Still reports SOB, currently saturating well on room air BNP pending Echo done 10/2020 showed EF of 40 to 45%, regional wall motion abnormality, repeat pending Nephrology consulted, HD as needed for fluid management Vascular surgery consulted, plans to remove [facility] since permanent access is  functioning Telemetry, monitor closely  Heart failure reduced ejection Appears overloaded CXR consistent with mild CHF Management with HD as above  History of CAD status post CABG in 2018 HLD Currently chest pain free Troponin WNL with a flat trend EKG with incomplete left bundle branch block ECHO pending Continue Spironolactone 25 mg daily carvedilol 25 mg twice daily, Imdur 30 mg daily Atorvastatin, fenofibrate  History of COPD Appears compensated and not in acute exacerbation at this time  Anemia of chronic kidney disease Baseline hemoglobin between 10-11, on admission 8.6 Anemia panel pending Daily CBC   Hypertension Continue amlodipine, carvedilol 25 mg twice daily, Clonidine 0.1 mg p.o. twice daily, resumed on nondialysis day, imdur, Spironolactone   Diabetes mellitus type 2, uncontrolled  Last A1c 9.6 on 06/11/2021 SSI, NovoLog Mix 70/30, Accu-Cheks, hypoglycemic protocol   Low back pain Likely musculoskeletal Lidocaine patches for now   GERD PPI   Estimated body mass index is 27.44 kg/m as calculated from the following:   Height as of this encounter: 5\' 6"  (1.676 m).   Weight as of this encounter: 77.1 kg.     Code Status: Full  Family Communication: None at bedside  Disposition Plan: Status is: Observation  The patient will require care spanning > 2 midnights and should be moved to inpatient because: Inpatient level of care appropriate due to severity of illness  Dispo: The patient is from: Home              Anticipated d/c is to: Home              Patient  currently is not medically stable to d/c.   Difficult to place patient No    Consultants: Nephrology Vascular surgery  Procedures: None  Antimicrobials: None  DVT prophylaxis: Heparin [place]   Objective: Vitals:   06/12/21 1215 06/12/21 1230 06/12/21 1245 06/12/21 1300  BP: 130/60 (!) 137/59 123/77 140/67  Pulse:      Resp: 20 (!) 22 18 (!) 22  Temp:      TempSrc:      SpO2:       Weight:      Height:        Intake/Output Summary (Last 24 hours) at 06/12/2021 1310 Last data filed at 06/11/2021 1856 Gross per 24 hour  Intake 240 ml  Output 2001 ml  Net -1761 ml   Filed Weights   06/11/21 0813  Weight: 77.1 kg    Exam: General: Mildly SOB, acutely ill-appearing Cardiovascular: S1, S2 present Respiratory: Diminished breath sounds bilaterally, with bibasilar crackles noted Abdomen: Soft, nontender, nondistended, bowel sounds present Musculoskeletal: 1+ bilateral pedal edema noted, right AVG, right TDC Skin: Normal Psychiatry: Normal mood    Data Reviewed: CBC: Recent Labs  Lab 06/11/21 0844 06/12/21 0644  WBC 7.6 7.3  HGB 8.6* 8.2*  HCT 26.4* 25.9*  MCV 88.0 89.6  PLT 255 222   Basic Metabolic Panel: Recent Labs  Lab 06/11/21 0844 06/12/21 0644  NA 135 136  K 2.9* 3.9  CL 95* 98  CO2 29 30  GLUCOSE 324* 318*  BUN 17 18  CREATININE 4.23* 3.94*  CALCIUM 7.8* 8.1*   GFR: Estimated Creatinine Clearance: 16.3 mL/min (A) (by C-G formula based on SCr of 3.94 mg/dL (H)). Liver Function Tests: No results for input(s): AST, ALT, ALKPHOS, BILITOT, PROT, ALBUMIN in the last 168 hours. No results for input(s): LIPASE, AMYLASE in the last 168 hours. No results for input(s): AMMONIA in the last 168 hours. Coagulation Profile: No results for input(s): INR, PROTIME in the last 168 hours. Cardiac Enzymes: No results for input(s): CKTOTAL, CKMB, CKMBINDEX, TROPONINI in the last 168 hours. BNP (last 3 results) No results for input(s): PROBNP in the last 8760 hours. HbA1C: Recent Labs    06/11/21 0844  HGBA1C 9.6*   CBG: Recent Labs  Lab 06/11/21 1755 06/11/21 2137 06/12/21 0818 06/12/21 1230  GLUCAP 163* 173* 283* 174*   Lipid Profile: No results for input(s): CHOL, HDL, LDLCALC, TRIG, CHOLHDL, LDLDIRECT in the last 72 hours. Thyroid Function Tests: No results for input(s): TSH, T4TOTAL, FREET4, T3FREE, THYROIDAB in the last 72  hours. Anemia Panel: No results for input(s): VITAMINB12, FOLATE, FERRITIN, TIBC, IRON, RETICCTPCT in the last 72 hours. Urine analysis:    Component Value Date/Time   COLORURINE STRAW (A) 10/21/2020 0829   APPEARANCEUR CLEAR (A) 10/21/2020 0829   LABSPEC 1.007 10/21/2020 0829   PHURINE 7.0 10/21/2020 0829   GLUCOSEU 150 (A) 10/21/2020 0829   HGBUR NEGATIVE 10/21/2020 0829   BILIRUBINUR NEGATIVE 10/21/2020 0829   KETONESUR NEGATIVE 10/21/2020 0829   PROTEINUR >=300 (A) 10/21/2020 0829   NITRITE NEGATIVE 10/21/2020 0829   LEUKOCYTESUR SMALL (A) 10/21/2020 0829   Sepsis Labs: @LABRCNTIP (procalcitonin:4,lacticidven:4)  ) Recent Results (from the past 240 hour(s))  Resp Panel by RT-PCR (Flu A&B, Covid) Nasopharyngeal Swab     Status: None   Collection Time: 06/11/21 10:28 AM   Specimen: Nasopharyngeal Swab; Nasopharyngeal(NP) swabs in vial transport medium  Result Value Ref Range Status   SARS Coronavirus 2 by RT PCR NEGATIVE NEGATIVE Final    Comment: (  NOTE) SARS-CoV-2 target nucleic acids are NOT DETECTED.  The SARS-CoV-2 RNA is generally detectable in upper respiratory specimens during the acute phase of infection. The lowest concentration of SARS-CoV-2 viral copies this assay can detect is 138 copies/mL. A negative result does not preclude SARS-Cov-2 infection and should not be used as the sole basis for treatment or other patient management decisions. A negative result may occur with  improper specimen collection/handling, submission of specimen other than nasopharyngeal swab, presence of viral mutation(s) within the areas targeted by this assay, and inadequate number of viral copies(<138 copies/mL). A negative result must be combined with clinical observations, patient history, and epidemiological information. The expected result is Negative.  Fact Sheet for Patients:  EntrepreneurPulse.com.au  Fact Sheet for Healthcare Providers:   IncredibleEmployment.be  This test is no t yet approved or cleared by the [place] FDA and  has been authorized for detection and/or diagnosis of SARS-CoV-2 by FDA under an Emergency Use Authorization (EUA). This EUA will remain  in effect (meaning this test can be used) for the duration of the COVID-19 declaration under Section 564(b)(1) of the Act, 21 U.S.C.section 360bbb-3(b)(1), unless the authorization is terminated  or revoked sooner.       Influenza A by PCR NEGATIVE NEGATIVE Final   Influenza B by PCR NEGATIVE NEGATIVE Final    Comment: (NOTE) The Xpert Xpress SARS-CoV-2/FLU/RSV plus assay is intended as an aid in the diagnosis of influenza from Nasopharyngeal swab specimens and should not be used as a sole basis for treatment. Nasal washings and aspirates are unacceptable for Xpert Xpress SARS-CoV-2/FLU/RSV testing.  Fact Sheet for Patients: EntrepreneurPulse.com.au  Fact Sheet for Healthcare Providers: IncredibleEmployment.be  This test is not yet approved or cleared by the [place] FDA and has been authorized for detection and/or diagnosis of SARS-CoV-2 by FDA under an Emergency Use Authorization (EUA). This EUA will remain in effect (meaning this test can be used) for the duration of the COVID-19 declaration under Section 564(b)(1) of the Act, 21 U.S.C. section 360bbb-3(b)(1), unless the authorization is terminated or revoked.  Performed at [facility], [address] [place], [place] [address]       Studies: No results found.  Scheduled Meds:  amLODipine  10 mg Oral Daily   aspirin EC  81 mg Oral Daily   atorvastatin  80 mg Oral q1800   carvedilol  25 mg Oral BID   Chlorhexidine Gluconate Cloth  6 each Topical Q0600   cloNIDine  0.1 mg Oral BID   [START ON 06/13/2021] COVID-19 mRNA Vac-TriS ([employer])  0.3 mL Intramuscular ONCE-1600   fenofibrate  160 mg Oral Daily   heparin   5,000 Units Subcutaneous Q8H   insulin aspart  0-5 Units Subcutaneous QHS   insulin aspart  0-6 Units Subcutaneous TID WC   insulin aspart protamine- aspart  15 Units Subcutaneous BID WC   isosorbide mononitrate  30 mg Oral Daily   lidocaine  2 patch Transdermal Q24H   mirtazapine  30 mg Oral QHS   pantoprazole  80 mg Oral Daily   spironolactone  25 mg Oral Daily   topiramate  25 mg Oral Daily   traZODone  50 mg Oral QHS   vitamin B-12  1,000 mcg Oral Daily    Continuous Infusions:   LOS: 0 days     Alma Friendly, MD [facility]  If 7PM-7AM, please contact night-coverage [email or web address] 06/12/2021, 1:10 PM

## 2021-06-12 NOTE — Progress Notes (Signed)
Spoke with Verdis Frederickson in telemetry at [phone] to transfer patient from room 211 to HD1.

## 2021-06-12 NOTE — Consult Note (Signed)
[facility] Admission History & Physical  MRN : [id number]  Terry Arroyo is a 60 y.o. ([birth year]) female who presents with chief complaint of  Chief Complaint  Patient presents with   Shortness of Breath   History of Present Illness:  I am asked to evaluate the patient by the nephrology service. The patient has a tunneled catheter and a functioning upper extremity dialysis access.    The patient reports they're not been any problems with any of their dialysis runs. They are reporting good flows with good parameters at dialysis. Patient denies pain or tenderness overlying the access.  There is no pain with dialysis.  The patient denies hand pain or finger pain consistent with steal syndrome.  No fevers or chills while on dialysis.   Current Facility-Administered Medications  Medication Dose Route Frequency Provider Last Rate Last Admin   acetaminophen (TYLENOL) tablet 650 mg  650 mg Oral Q6H PRN Cox, Amy N, DO       Or   acetaminophen (TYLENOL) suppository 650 mg  650 mg Rectal Q6H PRN Cox, Amy N, DO       amLODipine (NORVASC) tablet 10 mg  10 mg Oral Daily Cox, Amy N, DO   10 mg at 06/11/21 1802   aspirin EC tablet 81 mg  81 mg Oral Daily Cox, Amy N, DO   81 mg at 06/11/21 1802   atorvastatin (LIPITOR) tablet 80 mg  80 mg Oral q1800 Cox, Amy N, DO   80 mg at 06/11/21 1802   carvedilol (COREG) tablet 25 mg  25 mg Oral BID Cox, Amy N, DO   25 mg at 06/11/21 2141   Chlorhexidine Gluconate Cloth 2 % PADS 6 each  6 each Topical Q0600 Cox, Amy N, DO   6 each at 06/12/21 5956   cloNIDine (CATAPRES) tablet 0.1 mg  0.1 mg Oral BID Cox, Amy N, DO       [START ON 06/13/2021] COVID-19 mRNA Vac-TriS ([employer]) injection 0.3 mL  0.3 mL Intramuscular ONCE-1600 Alma Friendly, MD       fenofibrate tablet 160 mg  160 mg Oral Daily Cox, Amy N, DO   160 mg at 06/12/21 0907   heparin injection 5,000 Units  5,000 Units Subcutaneous Q8H Cox, Amy N, DO   5,000 Units at 06/12/21  0534   insulin aspart (novoLOG) injection 0-5 Units  0-5 Units Subcutaneous QHS Cox, Amy N, DO       insulin aspart (novoLOG) injection 0-6 Units  0-6 Units Subcutaneous TID WC Cox, Amy N, DO   3 Units at 06/12/21 0911   insulin aspart protamine- aspart (NOVOLOG MIX 70/30) injection 15 Units  15 Units Subcutaneous BID WC Cox, Amy N, DO   15 Units at 06/12/21 0911   isosorbide mononitrate (IMDUR) 24 hr tablet 30 mg  30 mg Oral Daily Cox, Amy N, DO       lidocaine (LIDODERM) 5 % 2 patch  2 patch Transdermal Q24H Cox, Amy N, DO   2 patch at 06/11/21 2144   mirtazapine (REMERON) tablet 30 mg  30 mg Oral QHS Cox, Amy N, DO   30 mg at 06/11/21 2142   ondansetron (ZOFRAN) tablet 4 mg  4 mg Oral Q6H PRN Cox, Amy N, DO       Or   ondansetron (ZOFRAN) injection 4 mg  4 mg Intravenous Q6H PRN Cox, Amy N, DO       pantoprazole (PROTONIX) EC tablet 80  mg  80 mg Oral Daily Cox, Amy N, DO   80 mg at 06/12/21 1191   spironolactone (ALDACTONE) tablet 25 mg  25 mg Oral Daily Kolluru, Sarath, MD   25 mg at 06/11/21 1802   topiramate (TOPAMAX) tablet 25 mg  25 mg Oral Daily Cox, Amy N, DO   25 mg at 06/12/21 4782   traZODone (DESYREL) tablet 50 mg  50 mg Oral QHS Cox, Amy N, DO   50 mg at 06/11/21 2141   vitamin B-12 (CYANOCOBALAMIN) tablet 1,000 mcg  1,000 mcg Oral Daily Cox, Amy N, DO   1,000 mcg at 06/12/21 9562   Past Medical History:  Diagnosis Date   Anemia    Angina at rest [facility])    Angioedema 03/20/2021   Lisinopril   Anterior cerebral aneurysm    approximately 5 mm; left MCA    Anxiety    Aortic atherosclerosis (HCC)    Asthma    Blind left eye    COPD (chronic obstructive pulmonary disease) (HCC)    Coronary artery disease    Depression    DOE (dyspnea on exertion)    ESRD (end stage renal disease) (HCC)    Gait instability    GERD (gastroesophageal reflux disease)    HFrEF (heart failure with reduced ejection fraction) ([place])    History of 2019 novel coronavirus disease (COVID-19) 03/2019    [facility] [place]   History of DVT (deep vein thrombosis)    Hx of CABG 2018   x 4; performed in [place]   Hyperlipidemia    Hypertension    MI (myocardial infarction) ([place]) 2018   no stents   Pituitary adenoma ([name])    Secondary hyperparathyroidism ([place])    Sleep apnea    non-compliant with nocturnal PAP therapy   Stroke [facility]) 2014   stated affected left side.   T2DM (type 2 diabetes mellitus) ([place])    Past Surgical History:  Procedure Laterality Date   AV FISTULA PLACEMENT Right 01/28/2021   Procedure: ARTERIOVENOUS (AV) FISTULA CREATION;  Surgeon: Katha Cabal, MD;  Location: [facility] ORS;  Service: Vascular;  Laterality: Right;   AV FISTULA PLACEMENT Right 05/13/2021   Procedure: INSERTION OF ARTERIOVENOUS (AV) GORE-TEX GRAFT ARM ( BRACHIAL AXILLARY );  Surgeon: Katha Cabal, MD;  Location: [facility] ORS;  Service: Vascular;  Laterality: Right;   [place]   pituitary tumor   BREAST EXCISIONAL BIOPSY Left yrs ago   benign   CESAREAN SECTION     CORONARY ARTERY BYPASS GRAFT  2018   x 4 vessels; performed in [place] N/A 11/07/2020   Procedure: DIALYSIS/PERMA CATHETER INSERTION;  Surgeon: Algernon Huxley, MD;  Location: [place] [facility];  Service: Cardiovascular;  Laterality: N/A;   EYE SURGERY Right 2021   cataracts    PERIPHERAL VASCULAR THROMBECTOMY Right 04/03/2021   Procedure: A/V Fistulagram ;  Surgeon: Katha Cabal, MD;  Location: [place] [facility];  Service: Cardiovascular;  Laterality: Right;   Social History Social History   Tobacco Use   Smoking status: Former    Pack years: 0.00    Types: Cigarettes    Quit date: 2018    Years since quitting: 4.4   Smokeless tobacco: Never  Vaping Use   Vaping Use: Never used  Substance Use Topics   Alcohol use: Not Currently   Drug use: Not Currently   Family History Family History  Problem Relation Age of Onset   CAD Mother    CAD  Brother    Breast cancer Sister 32  No family history of bleeding or clotting disorders, autoimmune disease or porphyria.  Allergies  Allergen Reactions   Hydralazine Hives, Shortness Of Breath, Swelling and Rash    Body aches    Kiwi Extract Hives, Swelling and Rash    Mouth swells    Lisinopril Swelling    Angioedema   Shellfish Allergy Anaphylaxis    Shrimp/lobster  Betadine leaves welts on skin     Betadine [Povidone Iodine] Hives and Rash    LEAVES WELTS ON SKIN   Metformin Diarrhea and Other (See Comments)    GI Intolerance   Tape Itching    Use paper tape whenever possible   REVIEW OF SYSTEMS (Negative unless checked)  Constitutional: [] Weight loss  [] Fever  [] Chills Cardiac: [] Chest pain   [] Chest pressure   [] Palpitations   [] Shortness of breath when laying flat   [] Shortness of breath at rest   [x] Shortness of breath with exertion. Vascular:  [] Pain in legs with walking   [] Pain in legs at rest   [] Pain in legs when laying flat   [] Claudication   [] Pain in feet when walking  [] Pain in feet at rest  [] Pain in feet when laying flat   [] History of DVT   [] Phlebitis   [] Swelling in legs   [] Varicose veins   [] Non-healing ulcers Pulmonary:   [] Uses home oxygen   [] Productive cough   [] Hemoptysis   [] Wheeze  [] COPD   [] Asthma Neurologic:  [] Dizziness  [] Blackouts   [] Seizures   [] History of stroke   [] History of TIA  [] Aphasia   [] Temporary blindness   [] Dysphagia   [] Weakness or numbness in arms   [] Weakness or numbness in legs Musculoskeletal:  [] Arthritis   [] Joint swelling   [] Joint pain   [] Low back pain Hematologic:  [] Easy bruising  [] Easy bleeding   [] Hypercoagulable state   [] Anemic  [] Hepatitis Gastrointestinal:  [] Blood in stool   [] Vomiting blood  [] Gastroesophageal reflux/heartburn   [] Difficulty swallowing. Genitourinary:  [x] Chronic kidney disease   [] Difficult urination  [] Frequent urination  [] Burning with urination   [] Blood in urine Skin:  [] Rashes   [] Ulcers    [] Wounds Psychological:  [] History of anxiety   []  History of major depression.  Physical Examination  Vitals:   06/12/21 0433 06/12/21 0757 06/12/21 0937 06/12/21 0956  BP: 130/62 134/63 (!) 149/72 (!) 149/72  Pulse: 61 (!) 58  68  Resp: 20 18  20   Temp: 98.6 F (37 C) 98.3 F (36.8 C)  98.1 F (36.7 C)  TempSrc: Oral Oral  Oral  SpO2: 99% 100% 100% 100%  Weight:      Height:       Body mass index is 27.44 kg/m. Gen: WD/WN, NAD Head: [place]/AT, No temporalis wasting. Prominent temp pulse not noted. Ear/Nose/Throat: Hearing grossly intact, nares w/o erythema or drainage, oropharynx w/o Erythema/Exudate,  Eyes: Conjunctiva clear, sclera non-icteric Neck: Trachea midline.  No JVD.  Pulmonary:  Good air movement, respirations not labored, no use of accessory muscles.  Cardiac: RRR, normal S1, S2. Vascular:  Vessel Right Left  Radial Palpable Palpable  Ulnar Not Palpable Not Palpable  Brachial Palpable Palpable  Carotid Palpable, without bruit Palpable, without bruit  Gastrointestinal: soft, non-tender/non-distended. No guarding/reflex.  Musculoskeletal: M/S 5/5 throughout.  Extremities without ischemic changes.  No deformity or atrophy.  Neurologic: Sensation grossly intact in extremities.  Symmetrical.  Speech is fluent. Motor exam as listed above. Psychiatric: Judgment intact, Mood & affect appropriate for pt's clinical situation. Dermatologic: No rashes or ulcers noted.  No cellulitis or open wounds. Lymph : No Cervical, Axillary, or Inguinal lymphadenopathy.  CBC Lab Results  Component Value Date   WBC 7.3 06/12/2021   HGB 8.2 (L) 06/12/2021   HCT 25.9 (L) 06/12/2021   MCV 89.6 06/12/2021   PLT 259 06/12/2021   BMET    Component Value Date/Time   NA 136 06/12/2021 0644   NA 134 (L) 11/18/2013 0138   K 3.9 06/12/2021 0644   K 3.8 11/18/2013 0138   CL 98 06/12/2021 0644   CL 98 11/18/2013 0138   CO2 30 06/12/2021 0644   CO2 29 11/18/2013 0138   GLUCOSE 318  (H) 06/12/2021 0644   GLUCOSE 348 (H) 11/18/2013 0138   BUN 18 06/12/2021 0644   BUN 11 11/18/2013 0138   CREATININE 3.94 (H) 06/12/2021 0644   CREATININE 0.87 11/18/2013 0138   CALCIUM 8.1 (L) 06/12/2021 0644   CALCIUM 9.4 11/18/2013 0138   GFRNONAA 13 (L) 06/12/2021 0644   GFRNONAA >60 11/18/2013 0138   GFRAA 18 (L) 09/15/2020 0403   GFRAA >60 11/18/2013 0138   Estimated Creatinine Clearance: 16.3 mL/min (A) (by C-G formula based on SCr of 3.94 mg/dL (H)).  COAG Lab Results  Component Value Date   INR 1.0 05/07/2021   INR 1.1 01/20/2021   INR 1.0 09/11/2020   Radiology DG Chest 2 View  Result Date: 06/11/2021 CLINICAL DATA:  Shortness of breath.  Back pain. EXAM: CHEST - 2 VIEW COMPARISON:  02/09/2021. FINDINGS: Right IJ line with tip again noted over the right atrium in unchanged position. Prior CABG. Stable cardiomegaly. Diffuse bilateral interstitial prominence and small bilateral pleural effusions. Findings suggest mild CHF. Similar findings noted on prior exam. No pneumothorax. IMPRESSION: 1. Right IJ line with tip again noted over the right atrium in unchanged position. 2. Prior CABG. Stable cardiomegaly. Diffuse bilateral interstitial prominence and small bilateral pleural effusions consistent with mild CHF. Similar findings noted on prior exam. Electronically Signed   By: Wirt   On: 06/11/2021 09:02   VAS US DUPLEX DIALYSIS ACCESS (AVF, AVG)  Result Date: 06/04/2021 DIALYSIS ACCESS Patient Name:  Terry Arroyo  Date of Exam:   06/04/2021 Medical Rec #: [id number]        Accession #:    [id number] Date of Birth: [birth year]         Patient Gender: F Patient Age:   62Y Exam Location:  [facility] Procedure:      VAS US DUPLEX DIALYSIS ACCESS (AVF, AVG) Referring Phys: [id number] [place] --------------------------------------------------------------------------------  History: Created 01/28/2021          5/25 Thrombectomy of avg. Performing Technologist:  Concha Norway RVT  Examination Guidelines: A complete evaluation includes B-mode imaging, spectral Doppler, color Doppler, and power Doppler as needed of all accessible portions of each vessel. Unilateral testing is considered an integral part of a complete examination. Limited examinations for reoccurring indications may be performed as noted.  Findings:   +--------------------+----------+-----------------+--------+ AVG                 PSV (cm/s)Flow Vol (mL/min)Describe +--------------------+----------+-----------------+--------+ Native artery inflow   267          2473                +--------------------+----------+-----------------+--------+ Arterial anastomosis   437                              +--------------------+----------+-----------------+--------+  Prox graft             201                              +--------------------+----------+-----------------+--------+ Mid graft              144                              +--------------------+----------+-----------------+--------+ Distal graft           155                              +--------------------+----------+-----------------+--------+ Venous anastomosis     182                              +--------------------+----------+-----------------+--------+ Venous outflow         102                              +--------------------+----------+-----------------+--------+ +--------------+------------+----------+---------+---------+-------------------+                 Diameter  Depth (cm)Branching   PSV       Flow Volume                       (cm)                        (cm/s)       (ml/min)       +--------------+------------+----------+---------+---------+-------------------+ Rt Rad Art Dis                                  40                        +--------------+------------+----------+---------+---------+-------------------+ Antegrade                                                                  +--------------+------------+----------+---------+---------+-------------------+  Summary: Widely patent right BrachAx AVG s/p thrombectomy.  *See table(s) above for measurements and observations.  Diagnosing physician: [name] MD Electronically signed by [name] MD on 06/04/2021 at 4:41:00 PM.   --------------------------------------------------------------------------------   Final     Assessment/Plan  1.  End-stage renal disease requiring hemodialysis:  The patient has an extremity access that is functioning well. Therefore, the patient will undergo removal of the tunneled catheter under local anesthesia.  The risks and benefits were described to the patient.  All questions were answered.  The patient agrees to proceed with angiography and intervention. Patient will continue dialysis therapy without further interruption if a successful intervention is not achieved then a tunneled catheter will be placed. Dialysis has already been arranged. 2.  Hypertension:  Patient will continue medical management; nephrology is following no changes in oral medications. 3. Diabetes mellitus:  Glucose will be monitored and oral medications been held this morning once the patient has undergone  the patient's procedure po intake will be reinitiated and again Accu-Cheks will be used to assess the blood glucose level and treat as needed. The patient will be restarted on the patient's usual hypoglycemic regime 4.  Coronary artery disease:  EKG will be monitored. Nitrates will be used if needed. The patient's oral cardiac medications will be continued.  Discussed with Dr. Mayme Genta, PA-C  06/12/2021 12:30 PM

## 2021-06-12 NOTE — Interval H&P Note (Signed)
History and Physical Interval Note:  06/12/2021 2:13 PM  Terry Arroyo  has presented today for surgery, with the diagnosis of ESRD.  The various methods of treatment have been discussed with the patient and family. After consideration of risks, benefits and other options for treatment, the patient has consented to  Procedure(s): DIALYSIS/PERMA CATHETER REMOVAL (N/A) as a surgical intervention.  The patient's history has been reviewed, patient examined, no change in status, stable for surgery.  I have reviewed the patient's chart and labs.  Questions were answered to the patient's satisfaction.     Leotis Pain

## 2021-06-12 NOTE — Plan of Care (Signed)
Continuing with plan of care. 

## 2021-06-12 NOTE — Progress Notes (Signed)
Pt had severe cramping during her HD tx today, S. Breeze, NP was made aware, 242ml of saline was given as per NP and UF was turned off. Patient had relief after interventions. Post Hd, pt denies complaints, vital signs are stable at this time.

## 2021-06-12 NOTE — Op Note (Signed)
Operative Note     Preoperative diagnosis:   1. ESRD with functional permanent access  Postoperative diagnosis:  1. ESRD with functional permanent access  Procedure:  Removal of right jugular Permcath  Surgeon:  Leotis Pain, MD  Anesthesia:  Local  EBL:  Minimal  Indication for the Procedure:  The patient has a functional permanent dialysis access and no longer needs their permcath.  This can be removed.  Risks and benefits are discussed and informed consent is obtained.  Description of the Procedure:  The patient's right neck, chest and existing catheter were sterilely prepped and draped. The area around the catheter was anesthetized copiously with 1% lidocaine. The catheter was dissected out with curved hemostats until the cuff was freed from the surrounding fibrous sheath. The fiber sheath was transected, and the catheter was then removed in its entirety using gentle traction. Pressure was held and sterile dressings were placed. The patient tolerated the procedure well and was taken to the recovery room in stable condition.     Leotis Pain  06/12/2021, 2:39 PM This note was created with Dragon Medical transcription system. Any errors in dictation are purely unintentional.

## 2021-06-12 NOTE — Care Management Obs Status (Signed)
[place] NOTIFICATION   Patient Details  Name: Terry Arroyo MRN: [id number] Date of Birth: 19-Mar-1961   Medicare Observation Status Notification Given:  Yes    Candie Chroman, LCSW 06/12/2021, 3:45 PM

## 2021-06-12 NOTE — Progress Notes (Signed)
UF off and 250 NS given due to patient cramping RN and doctor aware.

## 2021-06-13 ENCOUNTER — Observation Stay (HOSPITAL_BASED_OUTPATIENT_CLINIC_OR_DEPARTMENT_OTHER)
Admit: 2021-06-13 | Discharge: 2021-06-13 | Disposition: A | Discharge status: Home / Self Care | Payer: Medicare HMO | Attending: Vascular Surgery | Admitting: Vascular Surgery

## 2021-06-13 DIAGNOSIS — N186 End stage renal disease: Secondary | ICD-10-CM | POA: Diagnosis not present

## 2021-06-13 DIAGNOSIS — I5020 Unspecified systolic (congestive) heart failure: Secondary | ICD-10-CM | POA: Diagnosis not present

## 2021-06-13 DIAGNOSIS — E1159 Type 2 diabetes mellitus with other circulatory complications: Secondary | ICD-10-CM | POA: Diagnosis not present

## 2021-06-13 DIAGNOSIS — E1169 Type 2 diabetes mellitus with other specified complication: Secondary | ICD-10-CM | POA: Diagnosis not present

## 2021-06-13 DIAGNOSIS — I25118 Atherosclerotic heart disease of native coronary artery with other forms of angina pectoris: Secondary | ICD-10-CM | POA: Diagnosis not present

## 2021-06-13 DIAGNOSIS — R0609 Other forms of dyspnea: Secondary | ICD-10-CM

## 2021-06-13 DIAGNOSIS — E785 Hyperlipidemia, unspecified: Secondary | ICD-10-CM | POA: Diagnosis not present

## 2021-06-13 DIAGNOSIS — E8779 Other fluid overload: Secondary | ICD-10-CM | POA: Diagnosis not present

## 2021-06-13 DIAGNOSIS — I152 Hypertension secondary to endocrine disorders: Secondary | ICD-10-CM | POA: Diagnosis not present

## 2021-06-13 DIAGNOSIS — D631 Anemia in chronic kidney disease: Secondary | ICD-10-CM | POA: Diagnosis not present

## 2021-06-13 LAB — CBC WITH DIFFERENTIAL/PLATELET
Abs Immature Granulocytes: 0.01 10*3/uL (ref 0.00–0.07)
Basophils Absolute: 0.1 10*3/uL (ref 0.0–0.1)
Basophils Relative: 1 %
Eosinophils Absolute: 0.4 10*3/uL (ref 0.0–0.5)
Eosinophils Relative: 6 %
HCT: 25.4 % — ABNORMAL LOW (ref 36.0–46.0)
Hemoglobin: 8.1 g/dL — ABNORMAL LOW (ref 12.0–15.0)
Immature Granulocytes: 0 %
Lymphocytes Relative: 26 %
Lymphs Abs: 1.8 10*3/uL (ref 0.7–4.0)
MCH: 28.4 pg (ref 26.0–34.0)
MCHC: 31.9 g/dL (ref 30.0–36.0)
MCV: 89.1 fL (ref 80.0–100.0)
Monocytes Absolute: 0.6 10*3/uL (ref 0.1–1.0)
Monocytes Relative: 9 %
Neutro Abs: 3.8 10*3/uL (ref 1.7–7.7)
Neutrophils Relative %: 58 %
Platelets: 256 10*3/uL (ref 150–400)
RBC: 2.85 MIL/uL — ABNORMAL LOW (ref 3.87–5.11)
RDW: 14.6 % (ref 11.5–15.5)
WBC: 6.7 10*3/uL (ref 4.0–10.5)
nRBC: 0 % (ref 0.0–0.2)

## 2021-06-13 LAB — GLUCOSE, CAPILLARY
Glucose-Capillary: 246 mg/dL — ABNORMAL HIGH (ref 70–99)
Glucose-Capillary: 264 mg/dL — ABNORMAL HIGH (ref 70–99)
Glucose-Capillary: 191 mg/dL — ABNORMAL HIGH (ref 70–99)
Glucose-Capillary: 175 mg/dL — ABNORMAL HIGH (ref 70–99)

## 2021-06-13 LAB — BASIC METABOLIC PANEL
Anion gap: 7 (ref 5–15)
BUN: 23 mg/dL — ABNORMAL HIGH (ref 6–20)
CO2: 30 mmol/L (ref 22–32)
Calcium: 8.2 mg/dL — ABNORMAL LOW (ref 8.9–10.3)
Chloride: 97 mmol/L — ABNORMAL LOW (ref 98–111)
Creatinine, Ser: 3.96 mg/dL — ABNORMAL HIGH (ref 0.44–1.00)
GFR, Estimated: 13 mL/min — ABNORMAL LOW (ref >60)
Glucose, Bld: 254 mg/dL — ABNORMAL HIGH (ref 70–99)
Potassium: 3.6 mmol/L (ref 3.5–5.1)
Sodium: 134 mmol/L — ABNORMAL LOW (ref 135–145)

## 2021-06-13 LAB — VITAMIN B12: Vitamin B-12: 365 pg/mL (ref 180–914)

## 2021-06-13 LAB — ECHOCARDIOGRAM COMPLETE
AR max vel: 1.28 cm2
AV Peak grad: 9.2 mmHg
Ao pk vel: 1.52 m/s
Area-P 1/2: 3.89 cm2
Calc EF: 35.7 %
Height: 66 in
S' Lateral: 4.2 cm
Single Plane A2C EF: 35.3 %
Single Plane A4C EF: 35.5 %
Weight: 2720 oz

## 2021-06-13 LAB — FOLATE: Folate: 10.2 ng/mL (ref >5.9)

## 2021-06-13 LAB — IRON AND TIBC
Iron: 43 ug/dL (ref 28–170)
Saturation Ratios: 21 % (ref 10.4–31.8)
TIBC: 203 ug/dL — ABNORMAL LOW (ref 250–450)
UIBC: 160 ug/dL

## 2021-06-13 LAB — FERRITIN: Ferritin: 343 ng/mL — ABNORMAL HIGH (ref 11–307)

## 2021-06-13 MED ORDER — INSULIN ASPART 100 UNIT/ML IJ SOLN
2.0000 [IU] | Freq: Three times a day (TID) | INTRAMUSCULAR | Status: DC
  Administered 2021-06-13: 2 [IU] via SUBCUTANEOUS
  Filled 2021-06-13: qty 1

## 2021-06-13 MED ORDER — INSULIN ASPART 100 UNIT/ML IJ SOLN
4.0000 [IU] | Freq: Three times a day (TID) | INTRAMUSCULAR | Status: DC
  Administered 2021-06-13 – 2021-06-15 (×5): 4 [IU] via SUBCUTANEOUS
  Filled 2021-06-13 (×4): qty 1

## 2021-06-13 NOTE — TOC Initial Note (Signed)
Transition of Care [facility]) - Initial/Assessment Note    Patient Details  Name: Terry Arroyo MRN: [id number] Date of Birth: 10-03-1961  Transition of Care [facility]) CM/SW Contact:    Magnus Ivan, LCSW Phone Number: 06/13/2021, 3:47 PM  Clinical Narrative:                Spoke with patient regarding discharge planning. Patient lives alone and uses [facility]. PCP is Dr. Ouida Sills. Pharmacy is [facility] or [facility]. Informed patient of HHPT rec, patient current declines HH. Stated she will let staff know if she changes her mind. Patient denied additional needs at this time.   Expected Discharge Plan: Home/Self Care Barriers to Discharge: Continued Medical Work up   Patient Goals and CMS Choice Patient states their goals for this hospitalization and ongoing recovery are:: to return home CMS Medicare.gov Compare Post Acute Care list provided to:: Patient Choice offered to / list presented to : Patient  Expected Discharge Plan and Services Expected Discharge Plan: Home/Self Care                                              Prior Living Arrangements/Services     Patient language and need for interpreter reviewed:: Yes Do you feel safe going back to the place where you live?: Yes            Criminal Activity/Legal Involvement Pertinent to Current Situation/Hospitalization: No - Comment as needed  Activities of Daily Living Home Assistive Devices/Equipment: None ADL Screening (condition at time of admission) Patient's cognitive ability adequate to safely complete daily activities?: Yes Is the patient deaf or have difficulty hearing?: No Does the patient have difficulty seeing, even when wearing glasses/contacts?: No Does the patient have difficulty concentrating, remembering, or making decisions?: No Patient able to express need for assistance with ADLs?: Yes Does the patient have difficulty dressing or bathing?:  No Independently performs ADLs?: Yes (appropriate for developmental age) Does the patient have difficulty walking or climbing stairs?: No Weakness of Legs: None Weakness of Arms/Hands: None  Permission Sought/Granted                  Emotional Assessment       Orientation: : Oriented to Self, Oriented to Place, Oriented to  Time, Oriented to Situation Alcohol / Substance Use: Not Applicable Psych Involvement: No (comment)  Admission diagnosis:  Hypokalemia [E87.6] Acute pulmonary edema (HCC) [J81.0] ESRD (end stage renal disease) ([place]) [N18.6] Fluid overload [E87.70] Patient Active Problem List   Diagnosis Date Noted   Fluid overload 06/11/2021   Hypokalemia 06/11/2021   Acute pulmonary edema (HCC)    COPD (chronic obstructive pulmonary disease) with emphysema ([place]) 02/09/2021   Chronic sphenoidal sinusitis 01/01/2021   Contact dermatitis and other eczema due to other specified agent 01/01/2021   Hydronephrosis, left 01/01/2021   Other diseases of nasal cavity and sinuses(478.19) 01/01/2021   ESRD (end stage renal disease) ([place])    Reactive thrombocytosis 11/08/2020   Nephrotic syndrome 11/04/2020   SOB (shortness of breath) 11/01/2020   Acute on chronic combined systolic and diastolic CHF (congestive heart failure) ([place]) 10/21/2020   CAD (coronary artery disease) 10/21/2020   Hyperlipidemia associated with type 2 diabetes mellitus ([place]) 10/21/2020   HFrEF (heart failure with reduced ejection fraction) ([place])    Acute respiratory distress  COPD exacerbation ([place]) 10/10/2020   Kidney hematoma 09/13/2020   Hypertension associated with diabetes ([place])    Acute renal failure superimposed on stage 4 chronic kidney disease (HCC)    Hematuria 09/12/2020   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[place]) 09/03/2020   Blind left eye 09/03/2020   Brain aneurysm 09/03/2020   Esotropia 09/03/2020   Lesion of ulnar nerve 09/03/2020   Mixed hyperlipidemia  09/03/2020   Sensory hearing loss, bilateral 09/03/2020   Tear film insufficiency 09/03/2020   Health maintenance examination 06/27/2020   Secondary hyperparathyroidism of renal origin ([place]) 04/24/2020   Anemia in chronic kidney disease 03/10/2020   Benign hypertensive kidney disease with chronic kidney disease 03/10/2020   Hyposmolality and/or hyponatremia 03/10/2020   Stage 3b chronic kidney disease ([place]) 03/10/2020   Calf tenderness    Acute on chronic heart failure ([place]) 02/27/2020   Leg edema    DM2 (diabetes mellitus, type 2) ([place]) 02/26/2020   CKD (chronic kidney disease) stage 4, GFR 15-29 ml/min (HCC) 02/26/2020   Hyponatremia 02/26/2020   Anasarca 02/26/2020   Proteinuria 02/26/2020   Hypoalbuminemia 02/26/2020   Elevated troponin 02/26/2020   Acute on chronic systolic CHF (congestive heart failure) ([place]) 02/26/2020   Aortic atherosclerosis ([place]) 02/18/2020   Hyperglycemia    Nausea    Hypertensive emergency    Hypertensive urgency 11/24/2019   Chest pain 11/23/2019   OSA (obstructive sleep apnea) 04/10/2019   Mild episode of recurrent major depressive disorder ([place]) 04/10/2019   ACS (acute coronary syndrome) ([place]) 03/24/2019   Pneumonia due to COVID-19 virus 03/24/2019   Hypertensive heart/kidney disease w/chronic kidney disease stage III ([place]) 10/21/2017   Mild nonproliferative diabetic retinopathy of right eye associated with type 2 diabetes mellitus ([place]) 09/09/2017   Genital herpes 05/07/2017   DVT (deep venous thrombosis) ([place]) 12/30/2016   PVD (posterior vitreous detachment), right eye 01/09/2016   Diabetic peripheral angiopathy ([place]) 01/03/2016   Age-related nuclear cataract of both eyes 10/24/2015   Chronic nonintractable headache 10/02/2015   Dry eyes, bilateral 02/13/2015   Proptosis 12/18/2014   Iritis of right eye 12/03/2014   Anemia 08/30/2014   Depression 08/30/2014   PCP:  Kirk Ruths, MD Pharmacy:   [facility] [address],  [place] - [address] [place] [address] Phone: [phone] Fax: [facility] - [place], [address] [address] Phone: [phone] Fax: [phone]     Social Determinants of Health (SDOH) Interventions    Readmission Risk Interventions Readmission Risk Prevention Plan 11/03/2020 10/23/2020 10/12/2020  Transportation Screening Complete Complete Complete  Medication Review ([place]) Complete Complete Complete  PCP or Specialist appointment within 3-5 days of discharge Not Complete Complete Complete  PCP/Specialist Appt Not Complete comments To be scheduled by unit secretary. - -  HRI or Home Care Consult Complete Complete -  SW Recovery Care/Counseling Consult Complete Complete Complete  Palliative Care Screening Not Applicable Not Applicable Not Applicable  Skilled Nursing Facility Patient Refused Not Applicable Not Complete  SNF Comments Patient refuses states she is able to take care of herself. - Patient pending evaluation  Some recent data might be hidden

## 2021-06-13 NOTE — Plan of Care (Signed)
Continuing with plan of care. 

## 2021-06-13 NOTE — Progress Notes (Signed)
*  PRELIMINARY RESULTS* Echocardiogram 2D Echocardiogram has been performed.  Jonette Mate Stavroula Rohde 06/13/2021, 5:14 PM

## 2021-06-13 NOTE — Evaluation (Signed)
Physical Therapy Evaluation Patient Details Name: Terry Arroyo MRN: [id number] DOB: 08-Jan-1961 Today's Date: 06/13/2021   History of Present Illness  60 y.o. female with medical history significant for CAD status post CABG 2018, HTN, HLD, ESRD M/W/F presents to the ED with chief concerns of shortness of breath. She reports that she had her full dose of dialysis session on 06/10/2021, felt better initially, but worsened. She reports that the SOB has been gradually worsening over the last three weeks, has not been able to lay down to sleep at night and has been sitting up in a chair to sleep.  Clinical Impression  Patient resting in bed upon arrival to room; easily arouses to voice.  Oriented to basic information, follows commands and agreeable to participation with session.  Endorses chronic pain/soreness in back area; improved since hospitalization due to positioning/support of hospital bed.  Bilat UE/LE strength and ROM grossly symmetrical and WFL for basic transfers and mobiltiy; no focal weakness appreciated.  Able to complete bed mobility with mod indep; sit/stand, basic transfers and gait (120') with RW, close sup.  Demonstrates reciprocal stepping pattern with fair step height/length; mild sway/weight shifting, but no overt buckling or LOB noted. Mild SOB with gait efforts; sats >95% on RA throughout.  Prefers use of RW for optimal safety, energy conservation; therapist in agreement, recommends continued use at this time Would benefit from skilled PT to address above deficits and promote optimal return to PLOF.; Recommend transition to HHPT upon discharge from acute hospitalization.     Follow Up Recommendations Home health PT    Equipment Recommendations       Recommendations for Other Services       Precautions / Restrictions Precautions Precautions: Fall Precaution Comments: No BP R UE-AVF Restrictions Weight Bearing Restrictions: No      Mobility  Bed Mobility Overal bed  mobility: Modified Independent                  Transfers Overall transfer level: Needs assistance Equipment used: Rolling walker (2 wheeled) Transfers: Sit to/from Stand Sit to Stand: Supervision         General transfer comment: cuing for hand placement with movement transitions  Ambulation/Gait Ambulation/Gait assistance: Supervision Gait Distance (Feet): 120 Feet Assistive device: Rolling walker (2 wheeled)       General Gait Details: reciprocal stepping pattern with fair step height/length; mild sway/weight shifting, but no overt buckling or LOB noted. Mild SOB with gait efforts; sats >95% on RA throughout.  Prefers use of RW for optimal safety, energy conservation; therapist in agreement, recommends continued use at this time  Stairs            Wheelchair Mobility    Modified Rankin (Stroke Patients Only)       Balance Overall balance assessment: Needs assistance Sitting-balance support: No upper extremity supported;Feet supported Sitting balance-Leahy Scale: Good     Standing balance support: Bilateral upper extremity supported Standing balance-Leahy Scale: Fair                               Pertinent Vitals/Pain Pain Assessment: Faces Faces Pain Scale: Hurts a little bit Pain Location: chronic back pain Pain Descriptors / Indicators: Aching;Sore Pain Intervention(s): Limited activity within patient's tolerance;Monitored during session;Repositioned    Home Living Family/patient expects to be discharged to:: Private residence Living Arrangements: Alone Available Help at Discharge: Family;Available PRN/intermittently Type of Home: Apartment Home Access: Level entry;Elevator  Home Layout: One level Home Equipment: Cane - single point;Walker - 4 wheels;Shower seat Additional Comments: Pt lives in senior living apartment but reports after hospital discharge she plans on staying with father for a few days who lives in a home with  level entry and 1 level.    Prior Function Level of Independence: Independent with assistive device(s)         Comments: Uses rollator for household ambulation but also endorses use of SPC "when needed"/community distances. She uses public transport for dialysis. Uses shower chair for bathing.  Denies fall history.  No home O2.     Hand Dominance        Extremity/Trunk Assessment   Upper Extremity Assessment Upper Extremity Assessment: Overall WFL for tasks assessed    Lower Extremity Assessment Lower Extremity Assessment: Overall WFL for tasks assessed (grossly at least 4/5 throughout)       Communication   Communication: No difficulties  Cognition Arousal/Alertness: Awake/alert Behavior During Therapy: WFL for tasks assessed/performed;Flat affect Overall Cognitive Status: Within Functional Limits for tasks assessed                                        General Comments      Exercises     Assessment/Plan    PT Assessment Patient needs continued PT services  PT Problem List Decreased strength;Decreased activity tolerance;Decreased balance;Decreased mobility;Decreased knowledge of use of DME;Cardiopulmonary status limiting activity;Decreased safety awareness;Decreased knowledge of precautions       PT Treatment Interventions DME instruction;Gait training;Stair training;Functional mobility training;Therapeutic activities;Therapeutic exercise;Balance training;Patient/family education    PT Goals (Current goals can be found in the Care Plan section)  Acute Rehab PT Goals Patient Stated Goal: to get some rest PT Goal Formulation: With patient Time For Goal Achievement: 06/27/21 Potential to Achieve Goals: Good    Frequency Min 2X/week   Barriers to discharge        Co-evaluation               AM-PAC PT "6 Clicks" Mobility  Outcome Measure Help needed turning from your back to your side while in a flat bed without using bedrails?:  None Help needed moving from lying on your back to sitting on the side of a flat bed without using bedrails?: None Help needed moving to and from a bed to a chair (including a wheelchair)?: None Help needed standing up from a chair using your arms (e.g., wheelchair or bedside chair)?: None Help needed to walk in hospital room?: A Little Help needed climbing 3-5 steps with a railing? : A Little 6 Click Score: 22    End of Session Equipment Utilized During Treatment: Gait belt Activity Tolerance: Patient tolerated treatment well Patient left: in bed;with call bell/phone within reach Nurse Communication: Mobility status PT Visit Diagnosis: Muscle weakness (generalized) (M62.81);Difficulty in walking, not elsewhere classified (R26.2)    Time: 2446-2863 PT Time Calculation (min) (ACUTE ONLY): 17 min   Charges:   PT Evaluation $PT Eval Moderate Complexity: 1 Mod          Mandela Bello H. Owens Shark, PT, DPT, NCS 06/13/21, 3:18 PM [phone]

## 2021-06-13 NOTE — Progress Notes (Signed)
[facility]  PROGRESS NOTE   Subjective:   Out of bed to chair.  He ate well today. Has been doing physical therapy at this time.  Objective:  Vital signs in last 24 hours:  Temp:  [98.4 F (36.9 C)-99.3 F (37.4 C)] 99.3 F (37.4 C) (06/25 1203) Pulse Rate:  [63-68] 64 (06/25 1203) Resp:  [16-21] 17 (06/25 1203) BP: (126-148)/(64-72) 126/64 (06/25 1203) SpO2:  [96 %-100 %] 98 % (06/25 1203)  Weight change:  Filed Weights   06/11/21 0813  Weight: 77.1 kg    Intake/Output: I/O last 3 completed shifts: In: -  Out: 1660 [Other:1660]   Intake/Output this shift:  Total I/O In: 480 [P.O.:480] Out: -   Physical Exam: General:  No acute distress  Head:  Normocephalic, atraumatic. Moist oral mucosal membranes  Eyes:  Anicteric  Neck:  Supple  Lungs:   Clear to auscultation, normal effort  Heart:  S1S2 no rubs  Abdomen:   Soft, nontender, bowel sounds present  Extremities:  peripheral edema.  Neurologic:  Awake, alert, following commands  Skin:  No lesions  Access:     Basic Metabolic Panel: Recent Labs  Lab 06/11/21 0844 06/12/21 0644 06/13/21 0549  NA 135 136 134*  K 2.9* 3.9 3.6  CL 95* 98 97*  CO2 29 30 30   GLUCOSE 324* 318* 254*  BUN 17 18 23*  CREATININE 4.23* 3.94* 3.96*  CALCIUM 7.8* 8.1* 8.2*    CBC: Recent Labs  Lab 06/11/21 0844 06/12/21 0644 06/13/21 0549  WBC 7.6 7.3 6.7  NEUTROABS  --   --  3.8  HGB 8.6* 8.2* 8.1*  HCT 26.4* 25.9* 25.4*  MCV 88.0 89.6 89.1  PLT 255 259 256     Urinalysis: No results for input(s): COLORURINE, LABSPEC, PHURINE, GLUCOSEU, HGBUR, BILIRUBINUR, KETONESUR, PROTEINUR, UROBILINOGEN, NITRITE, LEUKOCYTESUR in the last 72 hours.  Invalid input(s): APPERANCEUR    Imaging: PERIPHERAL VASCULAR CATHETERIZATION  Result Date: 06/12/2021 See surgical note for result.    Medications:     amLODipine  10 mg Oral Daily   aspirin EC  81 mg Oral Daily   atorvastatin  80 mg Oral q1800    carvedilol  25 mg Oral BID   Chlorhexidine Gluconate Cloth  6 each Topical Q0600   cloNIDine  0.1 mg Oral BID   COVID-19 mRNA Vac-TriS ([employer])  0.3 mL Intramuscular ONCE-1600   fenofibrate  160 mg Oral Daily   heparin  5,000 Units Subcutaneous Q8H   insulin aspart  0-5 Units Subcutaneous QHS   insulin aspart  0-6 Units Subcutaneous TID WC   insulin aspart  2 Units Subcutaneous TID WC   insulin aspart protamine- aspart  15 Units Subcutaneous BID WC   isosorbide mononitrate  30 mg Oral Daily   lidocaine  2 patch Transdermal Q24H   mirtazapine  30 mg Oral QHS   pantoprazole  80 mg Oral Daily   spironolactone  25 mg Oral Daily   topiramate  25 mg Oral Daily   traZODone  50 mg Oral QHS   vitamin B-12  1,000 mcg Oral Daily    Assessment/ Plan:     Principal Problem:   Fluid overload Active Problems:   Hyperglycemia   Hypertension associated with diabetes (HCC)   HFrEF (heart failure with reduced ejection fraction) (HCC)   CAD (coronary artery disease)   Hyperlipidemia associated with type 2 diabetes mellitus ([place])   ESRD (end stage renal disease) (HCC)   Anemia in chronic  kidney disease   Blind left eye   Diabetic peripheral angiopathy (HCC)   DVT (deep venous thrombosis) (HCC)   Depression   Mixed hyperlipidemia   Hypokalemia  60 year old female with history of hypertension, COPD, diabetes, peripheral vascular disease, coronary artery disease, CABG, CVA and end-stage renal disease now admitted with history of acute pulmonary edema and volume overload. She was aggressively dialyzed.  AV graft has been working well.  She had her last dialysis yesterday.  Patient is advised on the importance of fluid restriction.  We will continue to monitor.   LOS: 0 Lyla Son, MD [facility] 6/25/20222:36 PM

## 2021-06-13 NOTE — Evaluation (Signed)
Occupational Therapy Evaluation Patient Details Name: Terry Arroyo MRN: [id number] DOB: Oct 23, 1961 Today's Date: 06/13/2021    History of Present Illness 60 y.o. female with medical history significant for CAD status post CABG 2018, HTN, HLD, ESRD M/W/F presents to the ED with chief concerns of shortness of breath. She reports that she had her full dose of dialysis session on 06/10/2021, felt better initially, but worsened. She reports that the SOB has been gradually worsening over the last three weeks, has not been able to lay down to sleep at night and has been sitting up in a chair to sleep.   Clinical Impression   Patient presenting with decreased I in self care, balance, functional mobility/transfers, endurance, and safety awareness. Patient reports living at home alone in senior living apartments PTA. She uses a cane for functional mobility. She plans on staying with her father for a few days at discharge for safety. Patient currently functioning at supervision overall for safety within the room with ambulation, toileting, and self care needed. Pt did need min cuing for safety awareness.  Patient will benefit from acute OT to increase overall independence in the areas of ADLs, functional mobility, and safety awareness in order to safely discharge home.     Follow Up Recommendations  No OT follow up;Supervision - Intermittent    Equipment Recommendations  None recommended by OT       Precautions / Restrictions Precautions Precautions: Fall      Mobility Bed Mobility Overal bed mobility: Modified Independent             General bed mobility comments: HOB elevated and use of bed rails. No physical assist provided.    Transfers Overall transfer level: Needs assistance   Transfers: Sit to/from Stand;Stand Pivot Transfers Sit to Stand: Supervision Stand pivot transfers: Supervision       General transfer comment: min cuing for safety awareness. No physical assist without  AD in room.    Balance Overall balance assessment: Needs assistance Sitting-balance support: Feet supported Sitting balance-Leahy Scale: Good     Standing balance support: During functional activity Standing balance-Leahy Scale: Fair                             ADL either performed or assessed with clinical judgement   ADL Overall ADL's : Needs assistance/impaired     Grooming: Wash/dry hands;Wash/dry face;Oral care;Standing;Supervision/safety                   Toilet Transfer: Supervision/safety;Regular Museum/gallery exhibitions officer and Hygiene: Supervision/safety;Sit to/from stand       Functional mobility during ADLs: Supervision/safety;Cueing for safety       Vision Baseline Vision/History: Wears glasses Wears Glasses: Reading only Patient Visual Report: No change from baseline              Pertinent Vitals/Pain Pain Assessment: 0-10 Pain Score: 0-No pain     Hand Dominance Left   Extremity/Trunk Assessment Upper Extremity Assessment Upper Extremity Assessment: Overall WFL for tasks assessed   Lower Extremity Assessment Lower Extremity Assessment: Overall WFL for tasks assessed       Communication Communication Communication: No difficulties   Cognition Arousal/Alertness: Awake/alert Behavior During Therapy: WFL for tasks assessed/performed Overall Cognitive Status: Within Functional Limits for tasks assessed  Home Living Family/patient expects to be discharged to:: Private residence Living Arrangements: Alone Available Help at Discharge: Family;Available PRN/intermittently Type of Home: Apartment Home Access: Level entry;Elevator     Home Layout: One level     Bathroom Shower/Tub: Teacher, early years/pre: Standard     Home Equipment: Cane - single point;Walker - 4 wheels;Shower seat   Additional Comments: Pt lives in senior  living apartment but reports after hospital discharge she plans on staying with father for a few days who lives in a home with level entry and 1 level.      Prior Functioning/Environment Level of Independence: Independent with assistive device(s)        Comments: Uses SPC for most of ambulation but also endorses use of RW "when needed"/community distances. She uses public transport for dialysis. Uses shower chair for bathing.        OT Problem List: Decreased strength;Decreased activity tolerance;Impaired balance (sitting and/or standing);Decreased safety awareness      OT Treatment/Interventions: Self-care/ADL training;Therapeutic exercise;Energy conservation;DME and/or AE instruction;Cognitive remediation/compensation;Therapeutic activities;Balance training;Patient/family education    OT Goals(Current goals can be found in the care plan section) Acute Rehab OT Goals Patient Stated Goal: to get some rest OT Goal Formulation: With patient Time For Goal Achievement: 06/27/21 Potential to Achieve Goals: Good  OT Frequency: Min 2X/week   Barriers to D/C:    none known at this time          AM-PAC OT "6 Clicks" Daily Activity     Outcome Measure Help from another person eating meals?: None Help from another person taking care of personal grooming?: None Help from another person toileting, which includes using toliet, bedpan, or urinal?: A Little Help from another person bathing (including washing, rinsing, drying)?: A Little Help from another person to put on and taking off regular upper body clothing?: None Help from another person to put on and taking off regular lower body clothing?: A Little 6 Click Score: 21   End of Session    Activity Tolerance: Patient tolerated treatment well Patient left: in bed;with call bell/phone within reach;with bed alarm set  OT Visit Diagnosis: Unsteadiness on feet (R26.81);Muscle weakness (generalized) (M62.81)                Time:  8264-1583 OT Time Calculation (min): 22 min Charges:  OT General Charges $OT Visit: 1 Visit OT Evaluation $OT Eval Low Complexity: 1 Low OT Treatments $Self Care/Home Management : 8-22 mins  Darleen Crocker, MS, OTR/L , CBIS ascom [phone]  06/13/21, 12:02 PM

## 2021-06-13 NOTE — Progress Notes (Signed)
PROGRESS NOTE  Terry Arroyo KGY:[id number] DOB: 1961/01/01 DOA: 06/11/2021 PCP: Kirk Ruths, MD  HPI/Recap of past 24 hours: Terry Arroyo is a 60 y.o. female with medical history significant for CAD status post CABG 2018, HTN, HLD, ESRD M/W/F presents to the ED with chief concerns of shortness of breath. She reports that she had her full dose of dialysis session on 06/10/2021, felt better initially, but worsened. She reports that the SOB has been gradually worsening over the last three weeks, has not been able to lay down to sleep at night and has been sitting up in a chair to sleep. Pt states that she still makes urine about 3 times a day. In the ED, vital signs stable, including saturating about 97% on room air.  Labs fairly unremarkable.  Troponin 15--> 14.  COVID/influenza were negative.  Patient admitted for further management     Today, patient denies any new complaints, reports breathing is improved after aggressive HD, still some generalized weakness this a.m. Denies any further chest tightness.  Patient advised to monitor salt intake as she frequently eats out.    Assessment/Plan: Principal Problem:   Fluid overload Active Problems:   Hyperglycemia   Hypertension associated with diabetes (HCC)   HFrEF (heart failure with reduced ejection fraction) (HCC)   CAD (coronary artery disease)   Hyperlipidemia associated with type 2 diabetes mellitus ([place])   ESRD (end stage renal disease) (HCC)   Anemia in chronic kidney disease   Blind left eye   Diabetic peripheral angiopathy (HCC)   DVT (deep venous thrombosis) (HCC)   Depression   Mixed hyperlipidemia   Hypokalemia   Fluid overload in setting of ESRD on HD- M/W/F (still making some urine) Currently saturating well on room air Nephrology consulted, HD as needed for fluid management Vascular surgery consulted, s/p removal of TDC on 06/13/21 Monitor closely  Heart failure reduced ejection Appears overloaded CXR  consistent with CHF as most HD patients BNP 3,501 Echo done 10/2020 showed EF of 40 to 45%, regional wall motion abnormality, repeat pending Management with HD as above  History of CAD status post CABG in 2018 HLD Currently chest pain free Troponin WNL with a flat trend EKG with incomplete left bundle branch block ECHO pending Continue Spironolactone 25 mg daily carvedilol 25 mg twice daily, Imdur 30 mg daily Atorvastatin, fenofibrate  History of COPD Appears compensated and not in acute exacerbation at this time  Anemia of chronic kidney disease Baseline hemoglobin between 10-11, on admission 8.6 Anemia panel showed iron 43, sats 21, ferritin 343 Daily CBC   Hypertension Continue amlodipine, carvedilol 25 mg twice daily, Clonidine 0.1 mg p.o. twice daily, resumed on nondialysis day, imdur, Spironolactone   Diabetes mellitus type 2, uncontrolled  Last A1c 9.6 on 06/11/2021 SSI, NovoLog Mix 70/30, Accu-Cheks, hypoglycemic protocol   Low back pain Likely musculoskeletal Lidocaine patches for now   GERD PPI   Estimated body mass index is 27.44 kg/m as calculated from the following:   Height as of this encounter: 5\' 6"  (1.676 m).   Weight as of this encounter: 77.1 kg.     Code Status: Full  Family Communication: None at bedside  Disposition Plan: Status is: Observation  The patient will require care spanning > 2 midnights and should be moved to inpatient because: Inpatient level of care appropriate due to severity of illness  Dispo: The patient is from: Home  Anticipated d/c is to: Home              Patient currently is not medically stable to d/c.   Difficult to place patient No    Consultants: Nephrology Vascular surgery  Procedures: Tunneled dialysis catheter removed on 06/12/2021  Antimicrobials: None  DVT prophylaxis: Heparin [place]   Objective: Vitals:   06/13/21 0350 06/13/21 0757 06/13/21 1203 06/13/21 1511  BP: 138/66 (!) 145/72  126/64   Pulse: 63 66 64   Resp: 20 16 17    Temp: 98.4 F (36.9 C) 98.7 F (37.1 C) 99.3 F (37.4 C)   TempSrc:  Oral Oral   SpO2: 96% 100% 98% 95%  Weight:      Height:        Intake/Output Summary (Last 24 hours) at 06/13/2021 1619 Last data filed at 06/13/2021 1400 Gross per 24 hour  Intake 480 ml  Output --  Net 480 ml   Filed Weights   06/11/21 0813  Weight: 77.1 kg    Exam: General: NAD, chronically ill-appearing Cardiovascular: S1, S2 present Respiratory: CTAB Abdomen: Soft, nontender, nondistended, bowel sounds present Musculoskeletal: No bilateral pedal edema noted, right AVG, right TDC Skin: Normal Psychiatry: Normal mood    Data Reviewed: CBC: Recent Labs  Lab 06/11/21 0844 06/12/21 0644 06/13/21 0549  WBC 7.6 7.3 6.7  NEUTROABS  --   --  3.8  HGB 8.6* 8.2* 8.1*  HCT 26.4* 25.9* 25.4*  MCV 88.0 89.6 89.1  PLT 255 259 706   Basic Metabolic Panel: Recent Labs  Lab 06/11/21 0844 06/12/21 0644 06/13/21 0549  NA 135 136 134*  K 2.9* 3.9 3.6  CL 95* 98 97*  CO2 29 30 30   GLUCOSE 324* 318* 254*  BUN 17 18 23*  CREATININE 4.23* 3.94* 3.96*  CALCIUM 7.8* 8.1* 8.2*   GFR: Estimated Creatinine Clearance: 16.2 mL/min (A) (by C-G formula based on SCr of 3.96 mg/dL (H)). Liver Function Tests: No results for input(s): AST, ALT, ALKPHOS, BILITOT, PROT, ALBUMIN in the last 168 hours. No results for input(s): LIPASE, AMYLASE in the last 168 hours. No results for input(s): AMMONIA in the last 168 hours. Coagulation Profile: No results for input(s): INR, PROTIME in the last 168 hours. Cardiac Enzymes: No results for input(s): CKTOTAL, CKMB, CKMBINDEX, TROPONINI in the last 168 hours. BNP (last 3 results) No results for input(s): PROBNP in the last 8760 hours. HbA1C: Recent Labs    06/11/21 0844  HGBA1C 9.6*   CBG: Recent Labs  Lab 06/12/21 1230 06/12/21 1617 06/12/21 2038 06/13/21 0754 06/13/21 1309  GLUCAP 174* 232* 219* 246* 264*    Lipid Profile: No results for input(s): CHOL, HDL, LDLCALC, TRIG, CHOLHDL, LDLDIRECT in the last 72 hours. Thyroid Function Tests: No results for input(s): TSH, T4TOTAL, FREET4, T3FREE, THYROIDAB in the last 72 hours. Anemia Panel: Recent Labs    06/13/21 0549  FOLATE 10.2  FERRITIN 343*  TIBC 203*  IRON 43   Urine analysis:    Component Value Date/Time   COLORURINE STRAW (A) 10/21/2020 0829   APPEARANCEUR CLEAR (A) 10/21/2020 0829   LABSPEC 1.007 10/21/2020 0829   PHURINE 7.0 10/21/2020 0829   GLUCOSEU 150 (A) 10/21/2020 0829   HGBUR NEGATIVE 10/21/2020 0829   BILIRUBINUR NEGATIVE 10/21/2020 0829   KETONESUR NEGATIVE 10/21/2020 0829   PROTEINUR >=300 (A) 10/21/2020 0829   NITRITE NEGATIVE 10/21/2020 0829   LEUKOCYTESUR SMALL (A) 10/21/2020 0829   Sepsis Labs: @LABRCNTIP (procalcitonin:4,lacticidven:4)  ) Recent Results (from the past  240 hour(s))  Resp Panel by RT-PCR (Flu A&B, Covid) Nasopharyngeal Swab     Status: None   Collection Time: 06/11/21 10:28 AM   Specimen: Nasopharyngeal Swab; Nasopharyngeal(NP) swabs in vial transport medium  Result Value Ref Range Status   SARS Coronavirus 2 by RT PCR NEGATIVE NEGATIVE Final    Comment: (NOTE) SARS-CoV-2 target nucleic acids are NOT DETECTED.  The SARS-CoV-2 RNA is generally detectable in upper respiratory specimens during the acute phase of infection. The lowest concentration of SARS-CoV-2 viral copies this assay can detect is 138 copies/mL. A negative result does not preclude SARS-Cov-2 infection and should not be used as the sole basis for treatment or other patient management decisions. A negative result may occur with  improper specimen collection/handling, submission of specimen other than nasopharyngeal swab, presence of viral mutation(s) within the areas targeted by this assay, and inadequate number of viral copies(<138 copies/mL). A negative result must be combined with clinical observations, patient  history, and epidemiological information. The expected result is Negative.  Fact Sheet for Patients:  EntrepreneurPulse.com.au  Fact Sheet for Healthcare Providers:  IncredibleEmployment.be  This test is no t yet approved or cleared by the [place] FDA and  has been authorized for detection and/or diagnosis of SARS-CoV-2 by FDA under an Emergency Use Authorization (EUA). This EUA will remain  in effect (meaning this test can be used) for the duration of the COVID-19 declaration under Section 564(b)(1) of the Act, 21 U.S.C.section 360bbb-3(b)(1), unless the authorization is terminated  or revoked sooner.       Influenza A by PCR NEGATIVE NEGATIVE Final   Influenza B by PCR NEGATIVE NEGATIVE Final    Comment: (NOTE) The Xpert Xpress SARS-CoV-2/FLU/RSV plus assay is intended as an aid in the diagnosis of influenza from Nasopharyngeal swab specimens and should not be used as a sole basis for treatment. Nasal washings and aspirates are unacceptable for Xpert Xpress SARS-CoV-2/FLU/RSV testing.  Fact Sheet for Patients: EntrepreneurPulse.com.au  Fact Sheet for Healthcare Providers: IncredibleEmployment.be  This test is not yet approved or cleared by the [place] FDA and has been authorized for detection and/or diagnosis of SARS-CoV-2 by FDA under an Emergency Use Authorization (EUA). This EUA will remain in effect (meaning this test can be used) for the duration of the COVID-19 declaration under Section 564(b)(1) of the Act, 21 U.S.C. section 360bbb-3(b)(1), unless the authorization is terminated or revoked.  Performed at [facility], [address] [place],  [address]       Studies: No results found.  Scheduled Meds:  amLODipine  10 mg Oral Daily   aspirin EC  81 mg Oral Daily   atorvastatin  80 mg Oral q1800   carvedilol  25 mg Oral BID   Chlorhexidine Gluconate Cloth   6 each Topical Q0600   cloNIDine  0.1 mg Oral BID   COVID-19 mRNA Vac-TriS (Pfizer)  0.3 mL Intramuscular ONCE-1600   fenofibrate  160 mg Oral Daily   heparin  5,000 Units Subcutaneous Q8H   insulin aspart  0-5 Units Subcutaneous QHS   insulin aspart  0-6 Units Subcutaneous TID WC   insulin aspart  2 Units Subcutaneous TID WC   insulin aspart protamine- aspart  15 Units Subcutaneous BID WC   isosorbide mononitrate  30 mg Oral Daily   lidocaine  2 patch Transdermal Q24H   mirtazapine  30 mg Oral QHS   pantoprazole  80 mg Oral Daily   spironolactone  25 mg Oral Daily  topiramate  25 mg Oral Daily   traZODone  50 mg Oral QHS   vitamin B-12  1,000 mcg Oral Daily    Continuous Infusions:   LOS: 0 days     Alma Friendly, MD [facility]  If 7PM-7AM, please contact night-coverage [email or web address] 06/13/2021, 4:19 PM

## 2021-06-14 DIAGNOSIS — E1169 Type 2 diabetes mellitus with other specified complication: Secondary | ICD-10-CM | POA: Diagnosis not present

## 2021-06-14 DIAGNOSIS — D631 Anemia in chronic kidney disease: Secondary | ICD-10-CM | POA: Diagnosis not present

## 2021-06-14 DIAGNOSIS — E785 Hyperlipidemia, unspecified: Secondary | ICD-10-CM | POA: Diagnosis not present

## 2021-06-14 DIAGNOSIS — I25118 Atherosclerotic heart disease of native coronary artery with other forms of angina pectoris: Secondary | ICD-10-CM | POA: Diagnosis not present

## 2021-06-14 DIAGNOSIS — I5022 Chronic systolic (congestive) heart failure: Secondary | ICD-10-CM | POA: Diagnosis not present

## 2021-06-14 DIAGNOSIS — E8779 Other fluid overload: Secondary | ICD-10-CM | POA: Diagnosis not present

## 2021-06-14 DIAGNOSIS — I5020 Unspecified systolic (congestive) heart failure: Secondary | ICD-10-CM | POA: Diagnosis not present

## 2021-06-14 DIAGNOSIS — E1159 Type 2 diabetes mellitus with other circulatory complications: Secondary | ICD-10-CM | POA: Diagnosis not present

## 2021-06-14 DIAGNOSIS — I152 Hypertension secondary to endocrine disorders: Secondary | ICD-10-CM | POA: Diagnosis not present

## 2021-06-14 DIAGNOSIS — I25811 Atherosclerosis of native coronary artery of transplanted heart without angina pectoris: Secondary | ICD-10-CM | POA: Diagnosis not present

## 2021-06-14 DIAGNOSIS — N186 End stage renal disease: Secondary | ICD-10-CM | POA: Diagnosis not present

## 2021-06-14 LAB — BASIC METABOLIC PANEL
Anion gap: 11 (ref 5–15)
BUN: 44 mg/dL — ABNORMAL HIGH (ref 6–20)
CO2: 27 mmol/L (ref 22–32)
Calcium: 8.2 mg/dL — ABNORMAL LOW (ref 8.9–10.3)
Chloride: 96 mmol/L — ABNORMAL LOW (ref 98–111)
Creatinine, Ser: 5.37 mg/dL — ABNORMAL HIGH (ref 0.44–1.00)
GFR, Estimated: 9 mL/min — ABNORMAL LOW (ref >60)
Glucose, Bld: 267 mg/dL — ABNORMAL HIGH (ref 70–99)
Potassium: 3.6 mmol/L (ref 3.5–5.1)
Sodium: 134 mmol/L — ABNORMAL LOW (ref 135–145)

## 2021-06-14 LAB — CBC WITH DIFFERENTIAL/PLATELET
Abs Immature Granulocytes: 0.02 10*3/uL (ref 0.00–0.07)
Basophils Absolute: 0.1 10*3/uL (ref 0.0–0.1)
Basophils Relative: 1 %
Eosinophils Absolute: 0.3 10*3/uL (ref 0.0–0.5)
Eosinophils Relative: 6 %
HCT: 24 % — ABNORMAL LOW (ref 36.0–46.0)
Hemoglobin: 8.1 g/dL — ABNORMAL LOW (ref 12.0–15.0)
Immature Granulocytes: 0 %
Lymphocytes Relative: 29 %
Lymphs Abs: 1.7 10*3/uL (ref 0.7–4.0)
MCH: 29.1 pg (ref 26.0–34.0)
MCHC: 33.8 g/dL (ref 30.0–36.0)
MCV: 86.3 fL (ref 80.0–100.0)
Monocytes Absolute: 0.5 10*3/uL (ref 0.1–1.0)
Monocytes Relative: 9 %
Neutro Abs: 3.3 10*3/uL (ref 1.7–7.7)
Neutrophils Relative %: 55 %
Platelets: 225 10*3/uL (ref 150–400)
RBC: 2.78 MIL/uL — ABNORMAL LOW (ref 3.87–5.11)
RDW: 14.6 % (ref 11.5–15.5)
WBC: 5.9 10*3/uL (ref 4.0–10.5)
nRBC: 0 % (ref 0.0–0.2)

## 2021-06-14 LAB — GLUCOSE, CAPILLARY
Glucose-Capillary: 274 mg/dL — ABNORMAL HIGH (ref 70–99)
Glucose-Capillary: 151 mg/dL — ABNORMAL HIGH (ref 70–99)
Glucose-Capillary: 122 mg/dL — ABNORMAL HIGH (ref 70–99)
Glucose-Capillary: 229 mg/dL — ABNORMAL HIGH (ref 70–99)

## 2021-06-14 MED ORDER — ALTEPLASE 2 MG IJ SOLR: 2.0000 mg | Freq: Once | INTRAMUSCULAR | Status: DC | PRN

## 2021-06-14 MED ORDER — SODIUM CHLORIDE 0.9 % IV SOLN: 100.0000 mL | INTRAVENOUS | Status: DC | PRN

## 2021-06-14 MED ORDER — CHLORHEXIDINE GLUCONATE CLOTH 2 % EX PADS: 6.0000 | MEDICATED_PAD | Freq: Every day | CUTANEOUS | Status: DC

## 2021-06-14 MED ORDER — PENTAFLUOROPROP-TETRAFLUOROETH EX AERO
1.0000 "application " | INHALATION_SPRAY | CUTANEOUS | Status: DC | PRN
  Filled 2021-06-14: qty 30

## 2021-06-14 MED ORDER — LIDOCAINE-PRILOCAINE 2.5-2.5 % EX CREA
1.0000 "application " | TOPICAL_CREAM | CUTANEOUS | Status: DC | PRN
  Filled 2021-06-14: qty 5

## 2021-06-14 MED ORDER — LIDOCAINE HCL (PF) 1 % IJ SOLN
5.0000 mL | INTRAMUSCULAR | Status: DC | PRN
  Filled 2021-06-14: qty 5

## 2021-06-14 MED ORDER — HEPARIN SODIUM (PORCINE) 1000 UNIT/ML DIALYSIS: 1000.0000 [IU] | INTRAMUSCULAR | Status: DC | PRN

## 2021-06-14 NOTE — Progress Notes (Signed)
Subjective:    Patient ID: Terry Arroyo, female    DOB: Apr 16, 1961, 60 y.o.   MRN: [id number] Chief Complaint  Patient presents with  . Follow-up    [facility] 3wk post op fu AV graf HDA of rt AV graft    The patient returns to the office for followup of their dialysis access.  The patient's graft was created on 05/13/2021.  Patient had a left brachial axillary AV graft placed.  The patient denies hand pain or other symptoms consistent with steal phenomena.  No significant arm swelling.  The patient denies redness or swelling at the access site. The patient denies fever or chills at home or while on dialysis.  The patient denies amaurosis fugax or recent TIA symptoms. There are no recent neurological changes noted. The patient denies claudication symptoms or rest pain symptoms. The patient denies history of DVT, PE or superficial thrombophlebitis. The patient denies recent episodes of angina or shortness of breath.   Today the flow volume is 2473.       Review of Systems  Cardiovascular:  Positive for leg swelling.  All other systems reviewed and are negative.     Objective:   Physical Exam Vitals reviewed.  HENT:     Head: Normocephalic.  Cardiovascular:     Rate and Rhythm: Normal rate.     Pulses:          Radial pulses are 2+ on the left side.     Arteriovenous access: Left arteriovenous access is present.    Comments: Good thrill and bruit Pulmonary:     Effort: Pulmonary effort is normal.  Neurological:     Mental Status: She is alert and oriented to person, place, and time.  Psychiatric:        Mood and Affect: Mood normal.        Behavior: Behavior normal.        Thought Content: Thought content normal.        Judgment: Judgment normal.    BP (!) 146/72   Pulse 67   Ht 5\' 6"  (1.676 m)   Wt 172 lb (78 kg)   BMI 27.76 kg/m   Past Medical History:  Diagnosis Date  . Anemia   . Angina at rest [facility])   . Angioedema 03/20/2021   Lisinopril  . Anterior  cerebral aneurysm    approximately 5 mm; left MCA   . Anxiety   . Aortic atherosclerosis ([place])   . Asthma   . Blind left eye   . COPD (chronic obstructive pulmonary disease) ([place])   . Coronary artery disease   . Depression   . DOE (dyspnea on exertion)   . ESRD (end stage renal disease) ([place])   . Gait instability   . GERD (gastroesophageal reflux disease)   . HFrEF (heart failure with reduced ejection fraction) ([place])   . History of 2019 novel coronavirus disease (COVID-19) 03/2019   [place]  . History of DVT (deep vein thrombosis)   . Hx of CABG 2018   x 4; performed in [place]  . Hyperlipidemia   . Hypertension   . MI (myocardial infarction) ([place]) 2018   no stents  . Pituitary adenoma ([place])   . Secondary hyperparathyroidism ([place])   . Sleep apnea    non-compliant with nocturnal PAP therapy  . Stroke [facility]) 2014   stated affected left side.  . T2DM (type 2 diabetes mellitus) ([place])     Social  History   Socioeconomic History  . Marital status: Single    Spouse name: Not on file  . Number of children: Not on file  . Years of education: Not on file  . Highest education level: Not on file  Occupational History    Comment: disabled  Tobacco Use  . Smoking status: Former    Pack years: 0.00    Types: Cigarettes    Quit date: 2018    Years since quitting: 4.4  . Smokeless tobacco: Never  Vaping Use  . Vaping Use: Never used  Substance and Sexual Activity  . Alcohol use: Not Currently  . Drug use: Not Currently  . Sexual activity: Not Currently  Other Topics Concern  . Not on file  Social History Narrative   Patient lives alone.  Feels safe in her home.   Social Determinants of Health   Financial Resource Strain: Not on file  Food Insecurity: Not on file  Transportation Needs: Not on file  Physical Activity: Not on file  Stress: Not on file  Social Connections: Not on file  Intimate Partner Violence: Not on file    Past Surgical  History:  Procedure Laterality Date  . AV FISTULA PLACEMENT Right 01/28/2021   Procedure: ARTERIOVENOUS (AV) FISTULA CREATION;  Surgeon: Katha Cabal, MD;  Location: [facility] ORS;  Service: Vascular;  Laterality: Right;  . AV FISTULA PLACEMENT Right 05/13/2021   Procedure: INSERTION OF ARTERIOVENOUS (AV) GORE-TEX GRAFT ARM ( BRACHIAL AXILLARY );  Surgeon: Katha Cabal, MD;  Location: [facility] ORS;  Service: Vascular;  Laterality: Right;  . BRAIN SURGERY  1986   pituitary tumor  . BREAST EXCISIONAL BIOPSY Left yrs ago   benign  . CESAREAN SECTION    . CORONARY ARTERY BYPASS GRAFT  2018   x 4 vessels; performed in [place]  . DIALYSIS/PERMA CATHETER INSERTION N/A 11/07/2020   Procedure: DIALYSIS/PERMA CATHETER INSERTION;  Surgeon: Algernon Huxley, MD;  Location: [place] [facility];  Service: Cardiovascular;  Laterality: N/A;  . EYE SURGERY Right 2021   cataracts   . PERIPHERAL VASCULAR THROMBECTOMY Right 04/03/2021   Procedure: A/V Fistulagram ;  Surgeon: Katha Cabal, MD;  Location: [place] [facility];  Service: Cardiovascular;  Laterality: Right;    Family History  Problem Relation Age of Onset  . CAD Mother   . CAD Brother   . Breast cancer Sister 47    Allergies  Allergen Reactions  . Hydralazine Hives, Shortness Of Breath, Swelling and Rash    Body aches   . Kiwi Extract Hives, Swelling and Rash    Mouth swells   . Lisinopril Swelling    Angioedema  . Shellfish Allergy Anaphylaxis    Shrimp/lobster  Betadine leaves welts on skin    . Betadine [Povidone Iodine] Hives and Rash    LEAVES WELTS ON SKIN  . Metformin Diarrhea and Other (See Comments)    GI Intolerance  . Tape Itching    Use paper tape whenever possible    CBC Latest Ref Rng & Units 06/14/2021 06/13/2021 06/12/2021  WBC 4.0 - 10.5 K/uL 5.9 6.7 7.3  Hemoglobin 12.0 - 15.0 g/dL 8.1(L) 8.1(L) 8.2(L)  Hematocrit 36.0 - 46.0 % 24.0(L) 25.4(L) 25.9(L)  Platelets 150 - 400 K/uL 225 256 259       CMP     Component Value Date/Time   NA 134 (L) 06/14/2021 0607   NA 134 (L) 11/18/2013 0138   K 3.6 06/14/2021 3532  K 3.8 11/18/2013 0138   CL 96 (L) 06/14/2021 0607   CL 98 11/18/2013 0138   CO2 27 06/14/2021 0607   CO2 29 11/18/2013 0138   GLUCOSE 267 (H) 06/14/2021 0607   GLUCOSE 348 (H) 11/18/2013 0138   BUN 44 (H) 06/14/2021 0607   BUN 11 11/18/2013 0138   CREATININE 5.37 (H) 06/14/2021 0607   CREATININE 0.87 11/18/2013 0138   CALCIUM 8.2 (L) 06/14/2021 0607   CALCIUM 9.4 11/18/2013 0138   PROT 6.6 01/28/2021 0717   ALBUMIN 2.6 (L) 01/28/2021 0717   AST 12 (L) 01/28/2021 0717   ALT 10 01/28/2021 0717   ALKPHOS 80 01/28/2021 0717   BILITOT 0.4 01/28/2021 0717   GFRNONAA 9 (L) 06/14/2021 0607   GFRNONAA >60 11/18/2013 0138   GFRAA 18 (L) 09/15/2020 0403   GFRAA >60 11/18/2013 0138     No results found.     Assessment & Plan:   1. ESRD (end stage renal disease) ([place]) Recommend:  The patient is doing well and currently has adequate dialysis access. The patient's dialysis center is not reporting any access issues. Flow pattern is stable when compared to the prior ultrasound.  The patient should have a duplex ultrasound of the dialysis access in 6 months. The patient will follow-up with me in the office after each ultrasound     2. Hyperlipidemia associated with type 2 diabetes mellitus ([place]) Continue statin as ordered and reviewed, no changes at this time   3. Hypertension associated with diabetes ([place]) Continue antihypertensive medications as already ordered, these medications have been reviewed and there are no changes at this time.    No current facility-administered medications on file prior to visit.   Current Outpatient Medications on File Prior to Visit  Medication Sig Dispense Refill  . acetaminophen (TYLENOL) 500 MG tablet Take 500-1,000 mg by mouth every 6 (six) hours as needed for mild pain or moderate pain.    [place] Kitchen albuterol (VENTOLIN  HFA) 108 (90 Base) MCG/ACT inhaler Inhale 2 puffs into the lungs every 6 (six) hours as needed for wheezing or shortness of breath.    [place] Kitchen amLODipine (NORVASC) 10 MG tablet Take 1 tablet (10 mg total) by mouth daily. 30 tablet 0  . ammonium lactate (LAC-HYDRIN) 12 % lotion Apply 1 application topically daily.    [place] Kitchen aspirin 81 MG EC tablet Take 1 tablet (81 mg total) by mouth daily. 30 tablet 0  . atorvastatin (LIPITOR) 80 MG tablet Take 1 tablet (80 mg total) by mouth daily at 6 PM. 30 tablet 0  . bismuth subsalicylate (PEPTO BISMOL) 262 MG/15ML suspension Take 30 mLs by mouth every 6 (six) hours as needed for indigestion or diarrhea or loose stools.    . carvedilol (COREG) 25 MG tablet Take 25 mg by mouth 2 (two) times daily.    . cloNIDine (CATAPRES) 0.1 MG tablet Take 0.1 mg by mouth 2 (two) times daily. (Non-dialysis days)    . fenofibrate (TRICOR) 145 MG tablet Take 145 mg by mouth daily.    . furosemide (LASIX) 40 MG tablet Take 40 mg by mouth daily.    [place] Kitchen HYDROcodone-acetaminophen (NORCO) 5-325 MG tablet Take 1-2 tablets by mouth every 6 (six) hours as needed for moderate pain or severe pain. (Patient not taking: No sig reported) 30 tablet 0  . insulin aspart protamine- aspart (NOVOLOG MIX 70/30) (70-30) 100 UNIT/ML injection Inject 0.15 mLs (15 Units total) into the skin 2 (two) times daily with a meal. 10 mL  0  . isosorbide mononitrate (IMDUR) 30 MG 24 hr tablet Take 30 mg by mouth daily.    [place] Kitchen loperamide (IMODIUM A-D) 2 MG tablet Take 4 mg by mouth 4 (four) times daily as needed for diarrhea or loose stools.    . mirtazapine (REMERON) 30 MG tablet Take 1 tablet (30 mg total) by mouth at bedtime. 30 tablet 0  . omeprazole (PRILOSEC) 40 MG capsule Take 40 mg by mouth daily.    . polyethylene glycol (MIRALAX / GLYCOLAX) 17 g packet Take 17 g by mouth daily. (Patient taking differently: Take 17 g by mouth daily as needed for mild constipation or moderate constipation.) 14 each 0  . topiramate  (TOPAMAX) 25 MG tablet Take 25 mg by mouth daily.    . traZODone (DESYREL) 50 MG tablet Take 50 mg by mouth at bedtime.    . vitamin B-12 (CYANOCOBALAMIN) 1000 MCG tablet Take 1,000 mcg by mouth daily.      There are no Patient Instructions on file for this visit. No follow-ups on file.   Kris Hartmann, NP

## 2021-06-14 NOTE — Progress Notes (Signed)
[facility]  PROGRESS NOTE   Subjective:   Out of bed to chair. Denies any chest pain or shortness of breath.  Objective:  Vital signs in last 24 hours:  Temp:  [98.1 F (36.7 C)-98.5 F (36.9 C)] 98.4 F (36.9 C) (06/26 0746) Pulse Rate:  [60-64] 60 (06/26 0746) Resp:  [16-18] 16 (06/26 0746) BP: (110-126)/(57-66) 126/57 (06/26 0746) SpO2:  [94 %-100 %] 99 % (06/26 0746)  Weight change:  Filed Weights   06/11/21 0813  Weight: 77.1 kg    Intake/Output: I/O last 3 completed shifts: In: 27 [P.O.:720] Out: -    Intake/Output this shift:  Total I/O In: 240 [P.O.:240] Out: -   Physical Exam: General:  No acute distress  Head:  Normocephalic, atraumatic. Moist oral mucosal membranes  Eyes:  Anicteric  Neck:  Supple  Lungs:   Clear to auscultation, normal effort  Heart:  S1S2 no rubs  Abdomen:   Soft, nontender, bowel sounds present  Extremities:  peripheral edema.  Neurologic:  Awake, alert, following commands  Skin:  No lesions  Access:     Basic Metabolic Panel: Recent Labs  Lab 06/11/21 0844 06/12/21 0644 06/13/21 0549 06/14/21 0607  NA 135 136 134* 134*  K 2.9* 3.9 3.6 3.6  CL 95* 98 97* 96*  CO2 29 30 30 27   GLUCOSE 324* 318* 254* 267*  BUN 17 18 23* 44*  CREATININE 4.23* 3.94* 3.96* 5.37*  CALCIUM 7.8* 8.1* 8.2* 8.2*    CBC: Recent Labs  Lab 06/11/21 0844 06/12/21 0644 06/13/21 0549 06/14/21 0607  WBC 7.6 7.3 6.7 5.9  NEUTROABS  --   --  3.8 3.3  HGB 8.6* 8.2* 8.1* 8.1*  HCT 26.4* 25.9* 25.4* 24.0*  MCV 88.0 89.6 89.1 86.3  PLT 255 259 256 225     Urinalysis: No results for input(s): COLORURINE, LABSPEC, PHURINE, GLUCOSEU, HGBUR, BILIRUBINUR, KETONESUR, PROTEINUR, UROBILINOGEN, NITRITE, LEUKOCYTESUR in the last 72 hours.  Invalid input(s): APPERANCEUR    Imaging: PERIPHERAL VASCULAR CATHETERIZATION  Result Date: 06/12/2021 See surgical note for result.  ECHOCARDIOGRAM COMPLETE  Result Date: 06/13/2021     ECHOCARDIOGRAM REPORT   Patient Name:   Terry Arroyo Date of Exam: 06/13/2021 Medical Rec #:  [id number]       Height:       66.0 in Accession #:    [id number]      Weight:       170.0 lb Date of Birth:  Oct 10, 1961        BSA:          1.866 m Patient Age:    58 years        BP:           130/60 mmHg Patient Gender: F               HR:           64 bpm. Exam Location:  [facility] Procedure: 2D Echo, Color Doppler and Cardiac Doppler Indications:     Dyspnea R06.00  History:         Patient has prior history of Echocardiogram examinations. CAD,                  Stroke; Signs/Symptoms:Dyspnea.  Sonographer:     Alyse Low Roar Referring Phys:  [place] Diagnosing Phys: Kate Sable MD IMPRESSIONS  1. Left ventricular ejection fraction, by estimation, is 35 to 40%. The left ventricle has moderately decreased function. The left  ventricle demonstrates regional wall motion abnormalities (see scoring diagram/findings for description). There is mild left ventricular hypertrophy. Left ventricular diastolic parameters are consistent with Grade II diastolic dysfunction (pseudonormalization). There is severe hypokinesis of the left ventricular, entire inferolateral wall.  2. Right ventricular systolic function is mildly reduced. The right ventricular size is normal.  3. Left atrial size was mild to moderately dilated.  4. Right atrial size was mildly dilated.  5. The mitral valve is degenerative. Mild mitral valve regurgitation.  6. Tricuspid valve regurgitation is mild to moderate.  7. The aortic valve is tricuspid. Aortic valve regurgitation is not visualized. FINDINGS  Left Ventricle: Left ventricular ejection fraction, by estimation, is 35 to 40%. The left ventricle has moderately decreased function. The left ventricle demonstrates regional wall motion abnormalities. Severe hypokinesis of the left ventricular, entire  inferolateral wall. The left ventricular internal cavity size was normal in size. There is mild left  ventricular hypertrophy. Left ventricular diastolic parameters are consistent with Grade II diastolic dysfunction (pseudonormalization). Right Ventricle: The right ventricular size is normal. No increase in right ventricular wall thickness. Right ventricular systolic function is mildly reduced. Left Atrium: Left atrial size was mild to moderately dilated. Right Atrium: Right atrial size was mildly dilated. Pericardium: There is no evidence of pericardial effusion. Mitral Valve: The mitral valve is degenerative in appearance. There is mild thickening of the mitral valve leaflet(s). There is moderate calcification of the mitral valve leaflet(s). Mild mitral valve regurgitation. Tricuspid Valve: The tricuspid valve is normal in structure. Tricuspid valve regurgitation is mild to moderate. Aortic Valve: The aortic valve is tricuspid. Aortic valve regurgitation is not visualized. Aortic valve peak gradient measures 9.2 mmHg. Pulmonic Valve: The pulmonic valve was normal in structure. Pulmonic valve regurgitation is mild. Aorta: The aortic root is normal in size and structure. Venous: The inferior vena cava was not well visualized. IAS/Shunts: No atrial level shunt detected by color flow Doppler.  LEFT VENTRICLE PLAX 2D LVIDd:         5.00 cm      Diastology LVIDs:         4.20 cm      LV e' medial:    4.90 cm/s LV PW:         1.20 cm      LV E/e' medial:  28.6 LV IVS:        1.20 cm      LV e' lateral:   4.35 cm/s LVOT diam:     1.70 cm      LV E/e' lateral: 32.2 LVOT Area:     2.27 cm  LV Volumes (MOD) LV vol d, MOD A2C: 110.0 ml LV vol d, MOD A4C: 115.0 ml LV vol s, MOD A2C: 71.2 ml LV vol s, MOD A4C: 74.2 ml LV SV MOD A2C:     38.8 ml LV SV MOD A4C:     115.0 ml LV SV MOD BP:      40.3 ml RIGHT VENTRICLE RV Mid diam:    3.00 cm RV S prime:     8.38 cm/s TAPSE (M-mode): 1.8 cm LEFT ATRIUM             Index       RIGHT ATRIUM           Index LA diam:        3.80 cm 2.04 cm/m  RA Area:     21.20 cm LA Vol (A2C):    73.4 ml 39.33 ml/m  RA Volume:   59.30 ml  31.77 ml/m LA Vol (A4C):   76.5 ml 40.99 ml/m LA Biplane Vol: 75.3 ml 40.35 ml/m  AORTIC VALVE                PULMONIC VALVE AV Area (Vmax): 1.28 cm    PV Vmax:        1.10 m/s AV Vmax:        152.00 cm/s PV Peak grad:   4.8 mmHg AV Peak Grad:   9.2 mmHg    RVOT Peak grad: 3 mmHg LVOT Vmax:      85.70 cm/s  AORTA Ao Root diam: 2.90 cm MITRAL VALVE                TRICUSPID VALVE MV Area (PHT): 3.89 cm     TR Peak grad:   32.7 mmHg MV Decel Time: 195 msec     TR Vmax:        286.00 cm/s MV E velocity: 140.00 cm/s MV A velocity: 56.90 cm/s   SHUNTS MV E/A ratio:  2.46         Systemic Diam: 1.70 cm MV A Prime:    7.5 cm/s Kate Sable MD Electronically signed by Kate Sable MD Signature Date/Time: 06/13/2021/5:56:08 PM    Final      Medications:     amLODipine  10 mg Oral Daily   aspirin EC  81 mg Oral Daily   atorvastatin  80 mg Oral q1800   carvedilol  25 mg Oral BID   cloNIDine  0.1 mg Oral BID   COVID-19 mRNA Vac-TriS (Pf[employer])  0.3 mL Intramuscular ONCE-1600   fenofibrate  160 mg Oral Daily   heparin  5,000 Units Subcutaneous Q8H   insulin aspart  0-5 Units Subcutaneous QHS   insulin aspart  0-6 Units Subcutaneous TID WC   insulin aspart  4 Units Subcutaneous TID WC   insulin aspart protamine- aspart  15 Units Subcutaneous BID WC   isosorbide mononitrate  30 mg Oral Daily   lidocaine  2 patch Transdermal Q24H   mirtazapine  30 mg Oral QHS   pantoprazole  80 mg Oral Daily   spironolactone  25 mg Oral Daily   topiramate  25 mg Oral Daily   traZODone  50 mg Oral QHS   vitamin B-12  1,000 mcg Oral Daily    Assessment/ Plan:     Principal Problem:   Fluid overload Active Problems:   Hyperglycemia   Hypertension associated with diabetes (HCC)   HFrEF (heart failure with reduced ejection fraction) (HCC)   CAD (coronary artery disease)   Hyperlipidemia associated with type 2 diabetes mellitus (HCC)   ESRD (end stage renal  disease) (HCC)   Anemia in chronic kidney disease   Blind left eye   Diabetic peripheral angiopathy (HCC)   DVT (deep venous thrombosis) (HCC)   Depression   Mixed hyperlipidemia   Hypokalemia  60 year old female with history of hypertension, COPD, diabetes, peripheral vascular disease, coronary artery disease, CABG, CVA and end-stage renal disease now admitted with history of acute pulmonary edema and volume overload.  She was aggressively dialyzed.  AV graft has been working well.  She had her last dialysis on Friday.   Patient is scheduled for dialysis tomorrow and will be discharged home  after dialysis.  Patient is advised on the importance of fluid restriction.  We will continue to monitor.   LOS: 0 Lyla Son, MD [facility] 6/26/20221:03 PM

## 2021-06-14 NOTE — TOC Progression Note (Signed)
Transition of Care [facility]) - Progression Note    Patient Details  Name: Terry Arroyo MRN: [id number] Date of Birth: [birth year]  Transition of Care [facility]) CM/SW Contact  Beverly Sessions, RN Phone Number: 06/14/2021, 9:19 AM  Clinical Narrative:     Patient still declines home health   Expected Discharge Plan: Home/Self Care Barriers to Discharge: Continued Medical Work up  Expected Discharge Plan and Services Expected Discharge Plan: Home/Self Care                                               Social Determinants of Health (SDOH) Interventions    Readmission Risk Interventions Readmission Risk Prevention Plan 11/03/2020 10/23/2020 10/12/2020  Transportation Screening Complete Complete Complete  Medication Review (RN Care Manager) Complete Complete Complete  PCP or Specialist appointment within 3-5 days of discharge Not Complete Complete Complete  PCP/Specialist Appt Not Complete comments To be scheduled by unit secretary. - -  HRI or Home Care Consult Complete Complete -  SW Recovery Care/Counseling Consult Complete Complete Complete  Palliative Care Screening Not Applicable Not Applicable Not Applicable  Skilled Nursing Facility Patient Refused Not Applicable Not Complete  SNF Comments Patient refuses states she is able to take care of herself. - Patient pending evaluation  Some recent data might be hidden

## 2021-06-14 NOTE — Consult Note (Signed)
[facility] Cardiology  CARDIOLOGY CONSULT NOTE  Patient ID: MAKHAYLA MCMURRY MRN: [id number] DOB/AGE: [birth year] 60 y.o.  Admit date: 06/11/2021 Referring Physician Horris Latino Primary Physician [facility] Primary Cardiologist [facility] Reason for Consultation chronic systolic congestive heart failure  HPI: 60 year old female referred for evaluation of chronic systolic congestive heart failure.  She has known coronary artery disease, status post CABG times 4 2018 in [place].  She has end-stage renal disease on chronic hemodialysis.  The patient was admitted on 06/11/2021 with chief complaint of shortness of breath.  During this hospitalization, right jugular PermCath has been removed the patient has undergone hemodialysis via AVG with overall clinical improvement, and resolution of shortness of breath.  2D echocardiogram 06/13/2021 revealed LVEF 35 to 40% compared to 2D echocardiogram 11/01/2020 which revealed LVEF 40 to 45%.  Patient denies chest pain.  High since her troponin were 15 and 14.  Admission ECG revealed sinus rhythm with nonspecific T wave abnormalities.  Review of systems complete and found to be negative unless listed above     Past Medical History:  Diagnosis Date   Anemia    Angina at rest [facility])    Angioedema 03/20/2021   Lisinopril   Anterior cerebral aneurysm    approximately 5 mm; left MCA    Anxiety    Aortic atherosclerosis (HCC)    Asthma    Blind left eye    COPD (chronic obstructive pulmonary disease) (HCC)    Coronary artery disease    Depression    DOE (dyspnea on exertion)    ESRD (end stage renal disease) (HCC)    Gait instability    GERD (gastroesophageal reflux disease)    HFrEF (heart failure with reduced ejection fraction) ([place])    History of 2019 novel coronavirus disease (COVID-19) 03/2019   [place]   History of DVT (deep vein thrombosis)    Hx of CABG 2018   x 4; performed in [place]   Hyperlipidemia    Hypertension    MI  (myocardial infarction) ([place]) 2018   no stents   Pituitary adenoma ([place])    Secondary hyperparathyroidism ([place])    Sleep apnea    non-compliant with nocturnal PAP therapy   Stroke [facility]) 2014   stated affected left side.   T2DM (type 2 diabetes mellitus) ([place])     Past Surgical History:  Procedure Laterality Date   AV FISTULA PLACEMENT Right 01/28/2021   Procedure: ARTERIOVENOUS (AV) FISTULA CREATION;  Surgeon: Katha Cabal, MD;  Location: [facility] ORS;  Service: Vascular;  Laterality: Right;   AV FISTULA PLACEMENT Right 05/13/2021   Procedure: INSERTION OF ARTERIOVENOUS (AV) GORE-TEX GRAFT ARM ( BRACHIAL AXILLARY );  Surgeon: Katha Cabal, MD;  Location: [facility] ORS;  Service: Vascular;  Laterality: Right;   [place]   pituitary tumor   BREAST EXCISIONAL BIOPSY Left yrs ago   benign   CESAREAN SECTION     CORONARY ARTERY BYPASS GRAFT  2018   x 4 vessels; performed in [place] N/A 11/07/2020   Procedure: DIALYSIS/PERMA CATHETER INSERTION;  Surgeon: Algernon Huxley, MD;  Location: [place] CV LAB;  Service: Cardiovascular;  Laterality: N/A;   EYE SURGERY Right 2021   cataracts    PERIPHERAL VASCULAR THROMBECTOMY Right 04/03/2021   Procedure: A/V Fistulagram ;  Surgeon: Katha Cabal, MD;  Location: [place] CV LAB;  Service: Cardiovascular;  Laterality: Right;    Medications Prior to  Admission  Medication Sig Dispense Refill Last Dose   acetaminophen (TYLENOL) 500 MG tablet Take 500-1,000 mg by mouth every 6 (six) hours as needed for mild pain or moderate pain.   Unknown at PRN   albuterol (VENTOLIN HFA) 108 (90 Base) MCG/ACT inhaler Inhale 2 puffs into the lungs every 6 (six) hours as needed for wheezing or shortness of breath.   Unknown at PRN   amLODipine (NORVASC) 10 MG tablet Take 1 tablet (10 mg total) by mouth daily. 30 tablet 0 06/10/2021 at Unknown   ammonium lactate (LAC-HYDRIN) 12 % lotion Apply 1 application  topically daily.      aspirin 81 MG EC tablet Take 1 tablet (81 mg total) by mouth daily. 30 tablet 0 06/10/2021 at Unknown   atorvastatin (LIPITOR) 80 MG tablet Take 1 tablet (80 mg total) by mouth daily at 6 PM. 30 tablet 0 06/10/2021 at 7939   bismuth subsalicylate (PEPTO BISMOL) 262 MG/15ML suspension Take 30 mLs by mouth every 6 (six) hours as needed for indigestion or diarrhea or loose stools.   Unknown at PRN   carvedilol (COREG) 25 MG tablet Take 25 mg by mouth 2 (two) times daily.   06/10/2021 at 2100   cloNIDine (CATAPRES) 0.1 MG tablet Take 0.1 mg by mouth 2 (two) times daily. (Non-dialysis days)   Past Week at Unknown   fenofibrate (TRICOR) 145 MG tablet Take 145 mg by mouth daily.   06/10/2021 at Unknown   furosemide (LASIX) 40 MG tablet Take 40 mg by mouth daily.   06/10/2021 at Unknown   insulin aspart protamine- aspart (NOVOLOG MIX 70/30) (70-30) 100 UNIT/ML injection Inject 0.15 mLs (15 Units total) into the skin 2 (two) times daily with a meal. 10 mL 0    isosorbide mononitrate (IMDUR) 30 MG 24 hr tablet Take 30 mg by mouth daily.   06/10/2021 at Unknown   loperamide (IMODIUM A-D) 2 MG tablet Take 4 mg by mouth 4 (four) times daily as needed for diarrhea or loose stools.   Unknown at PRN   mirtazapine (REMERON) 30 MG tablet Take 1 tablet (30 mg total) by mouth at bedtime. 30 tablet 0 06/10/2021 at 2100   mometasone-formoterol (DULERA) 200-5 MCG/ACT AERO Inhale 2 puffs into the lungs 2 (two) times daily.   06/10/2021 at 2100   omeprazole (PRILOSEC) 40 MG capsule Take 40 mg by mouth daily.   06/10/2021 at Unknown   polyethylene glycol (MIRALAX / GLYCOLAX) 17 g packet Take 17 g by mouth daily. (Patient taking differently: Take 17 g by mouth daily as needed for mild constipation or moderate constipation.) 14 each 0 Unknown at PRN   potassium chloride (KLOR-CON) 10 MEQ tablet Take 10 mEq by mouth daily.   06/10/2021 at Unknown   topiramate (TOPAMAX) 25 MG tablet Take 25 mg by mouth daily.    06/10/2021 at Unknown   traZODone (DESYREL) 50 MG tablet Take 50 mg by mouth at bedtime.   06/10/2021 at 2100   vitamin B-12 (CYANOCOBALAMIN) 1000 MCG tablet Take 1,000 mcg by mouth daily.      HYDROcodone-acetaminophen (NORCO) 5-325 MG tablet Take 1-2 tablets by mouth every 6 (six) hours as needed for moderate pain or severe pain. (Patient not taking: No sig reported) 30 tablet 0 Not Taking   Social History   Socioeconomic History   Marital status: Single    Spouse name: Not on file   Number of children: Not on file   Years of education: Not on file  Highest education level: Not on file  Occupational History    Comment: disabled  Tobacco Use   Smoking status: Former    Pack years: 0.00    Types: Cigarettes    Quit date: 2018    Years since quitting: 4.4   Smokeless tobacco: Never  Vaping Use   Vaping Use: Never used  Substance and Sexual Activity   Alcohol use: Not Currently   Drug use: Not Currently   Sexual activity: Not Currently  Other Topics Concern   Not on file  Social History Narrative   Patient lives alone.  Feels safe in her home.   Social Determinants of Health   Financial Resource Strain: Not on file  Food Insecurity: Not on file  Transportation Needs: Not on file  Physical Activity: Not on file  Stress: Not on file  Social Connections: Not on file  Intimate Partner Violence: Not on file    Family History  Problem Relation Age of Onset   CAD Mother    CAD Brother    Breast cancer Sister 55      Review of systems complete and found to be negative unless listed above      PHYSICAL EXAM  General: Well developed, well nourished, in no acute distress HEENT:  Normocephalic and atramatic Neck:  No JVD.  Lungs: Clear bilaterally to auscultation and percussion. Heart: HRRR . Normal S1 and S2 without gallops or murmurs.  Abdomen: Bowel sounds are positive, abdomen soft and non-tender  Msk:  Back normal, normal gait. Normal strength and tone for  age. Extremities: No clubbing, cyanosis or edema.   Neuro: Alert and oriented X 3. Psych:  Good affect, responds appropriately  Labs:   Lab Results  Component Value Date   WBC 5.9 06/14/2021   HGB 8.1 (L) 06/14/2021   HCT 24.0 (L) 06/14/2021   MCV 86.3 06/14/2021   PLT 225 06/14/2021    Recent Labs  Lab 06/14/21 0607  NA 134*  K 3.6  CL 96*  CO2 27  BUN 44*  CREATININE 5.37*  CALCIUM 8.2*  GLUCOSE 267*   No results found for: CKTOTAL, CKMB, CKMBINDEX, TROPONINI  Lab Results  Component Value Date   CHOL 290 (H) 01/22/2020   CHOL 316 (H) 11/24/2019   Lab Results  Component Value Date   HDL 46 01/22/2020   HDL 39 (L) 11/24/2019   Lab Results  Component Value Date   LDLCALC UNABLE TO CALCULATE IF TRIGLYCERIDE OVER 400 mg/dL 01/22/2020   LDLCALC UNABLE TO CALCULATE IF TRIGLYCERIDE OVER 400 mg/dL 11/24/2019   Lab Results  Component Value Date   TRIG 453 (H) 01/22/2020   TRIG 564 (H) 11/24/2019   Lab Results  Component Value Date   CHOLHDL 6.3 01/22/2020   CHOLHDL 8.1 11/24/2019   Lab Results  Component Value Date   LDLDIRECT 145.2 (H) 01/22/2020   LDLDIRECT 140.0 (H) 11/24/2019      Radiology: DG Chest 2 View  Result Date: 06/11/2021 CLINICAL DATA:  Shortness of breath.  Back pain. EXAM: CHEST - 2 VIEW COMPARISON:  02/09/2021. FINDINGS: Right IJ line with tip again noted over the right atrium in unchanged position. Prior CABG. Stable cardiomegaly. Diffuse bilateral interstitial prominence and small bilateral pleural effusions. Findings suggest mild CHF. Similar findings noted on prior exam. No pneumothorax. IMPRESSION: 1. Right IJ line with tip again noted over the right atrium in unchanged position. 2. Prior CABG. Stable cardiomegaly. Diffuse bilateral interstitial prominence and small bilateral pleural effusions consistent  with mild CHF. Similar findings noted on prior exam. Electronically Signed   By: [place]   On: 06/11/2021 09:02   PERIPHERAL  VASCULAR CATHETERIZATION  Result Date: 06/12/2021 See surgical note for result.  VAS US DUPLEX DIALYSIS ACCESS (AVF, AVG)  Result Date: 06/04/2021 DIALYSIS ACCESS Patient Name:  SHERLEEN PANGBORN  Date of Exam:   06/04/2021 Medical Rec #: [id number]        Accession #:    [id number] Date of Birth: Jan 16, 1961         Patient Gender: F Patient Age:   52Y Exam Location:  [facility] Procedure:      VAS US DUPLEX DIALYSIS ACCESS (AVF, AVG) Referring Phys: [id number] [place] --------------------------------------------------------------------------------  History: Created 01/28/2021          5/25 Thrombectomy of avg. Performing Technologist: Concha Norway RVT  Examination Guidelines: A complete evaluation includes B-mode imaging, spectral Doppler, color Doppler, and power Doppler as needed of all accessible portions of each vessel. Unilateral testing is considered an integral part of a complete examination. Limited examinations for reoccurring indications may be performed as noted.  Findings:   +--------------------+----------+-----------------+--------+ AVG                 PSV (cm/s)Flow Vol (mL/min)Describe +--------------------+----------+-----------------+--------+ Native artery inflow   267          2473                +--------------------+----------+-----------------+--------+ Arterial anastomosis   437                              +--------------------+----------+-----------------+--------+ Prox graft             201                              +--------------------+----------+-----------------+--------+ Mid graft              144                              +--------------------+----------+-----------------+--------+ Distal graft           155                              +--------------------+----------+-----------------+--------+ Venous anastomosis     182                              +--------------------+----------+-----------------+--------+ Venous  outflow         102                              +--------------------+----------+-----------------+--------+ +--------------+------------+----------+---------+---------+-------------------+                 Diameter  Depth (cm)Branching   PSV       Flow Volume                       (cm)                        (cm/s)       (ml/min)       +--------------+------------+----------+---------+---------+-------------------+ Rt Rad Art Dis  40                        +--------------+------------+----------+---------+---------+-------------------+ Antegrade                                                                 +--------------+------------+----------+---------+---------+-------------------+  Summary: Widely patent right BrachAx AVG s/p thrombectomy.  *See table(s) above for measurements and observations.  Diagnosing physician: [name] MD Electronically signed by [name] MD on 06/04/2021 at 4:41:00 PM.   --------------------------------------------------------------------------------   Final    ECHOCARDIOGRAM COMPLETE  Result Date: 06/13/2021    ECHOCARDIOGRAM REPORT   Patient Name:   TAE ROBAK Date of Exam: 06/13/2021 Medical Rec #:  [id number]       Height:       66.0 in Accession #:    [id number]      Weight:       170.0 lb Date of Birth:  05-24-1961        BSA:          1.866 m Patient Age:    48 years        BP:           130/60 mmHg Patient Gender: F               HR:           64 bpm. Exam Location:  [facility] Procedure: 2D Echo, Color Doppler and Cardiac Doppler Indications:     Dyspnea R06.00  History:         Patient has prior history of Echocardiogram examinations. CAD,                  Stroke; Signs/Symptoms:Dyspnea.  Sonographer:     Alyse Low Roar Referring Phys:  [place] Diagnosing Phys: Kate Sable MD IMPRESSIONS  1. Left ventricular ejection fraction, by estimation, is 35 to 40%. The left ventricle has  moderately decreased function. The left ventricle demonstrates regional wall motion abnormalities (see scoring diagram/findings for description). There is mild left ventricular hypertrophy. Left ventricular diastolic parameters are consistent with Grade II diastolic dysfunction (pseudonormalization). There is severe hypokinesis of the left ventricular, entire inferolateral wall.  2. Right ventricular systolic function is mildly reduced. The right ventricular size is normal.  3. Left atrial size was mild to moderately dilated.  4. Right atrial size was mildly dilated.  5. The mitral valve is degenerative. Mild mitral valve regurgitation.  6. Tricuspid valve regurgitation is mild to moderate.  7. The aortic valve is tricuspid. Aortic valve regurgitation is not visualized. FINDINGS  Left Ventricle: Left ventricular ejection fraction, by estimation, is 35 to 40%. The left ventricle has moderately decreased function. The left ventricle demonstrates regional wall motion abnormalities. Severe hypokinesis of the left ventricular, entire  inferolateral wall. The left ventricular internal cavity size was normal in size. There is mild left ventricular hypertrophy. Left ventricular diastolic parameters are consistent with Grade II diastolic dysfunction (pseudonormalization). Right Ventricle: The right ventricular size is normal. No increase in right ventricular wall thickness. Right ventricular systolic function is mildly reduced. Left Atrium: Left atrial size was mild to moderately dilated. Right Atrium: Right atrial size was mildly dilated. Pericardium: There is no evidence of pericardial effusion. Mitral  Valve: The mitral valve is degenerative in appearance. There is mild thickening of the mitral valve leaflet(s). There is moderate calcification of the mitral valve leaflet(s). Mild mitral valve regurgitation. Tricuspid Valve: The tricuspid valve is normal in structure. Tricuspid valve regurgitation is mild to moderate.  Aortic Valve: The aortic valve is tricuspid. Aortic valve regurgitation is not visualized. Aortic valve peak gradient measures 9.2 mmHg. Pulmonic Valve: The pulmonic valve was normal in structure. Pulmonic valve regurgitation is mild. Aorta: The aortic root is normal in size and structure. Venous: The inferior vena cava was not well visualized. IAS/Shunts: No atrial level shunt detected by color flow Doppler.  LEFT VENTRICLE PLAX 2D LVIDd:         5.00 cm      Diastology LVIDs:         4.20 cm      LV e' medial:    4.90 cm/s LV PW:         1.20 cm      LV E/e' medial:  28.6 LV IVS:        1.20 cm      LV e' lateral:   4.35 cm/s LVOT diam:     1.70 cm      LV E/e' lateral: 32.2 LVOT Area:     2.27 cm  LV Volumes (MOD) LV vol d, MOD A2C: 110.0 ml LV vol d, MOD A4C: 115.0 ml LV vol s, MOD A2C: 71.2 ml LV vol s, MOD A4C: 74.2 ml LV SV MOD A2C:     38.8 ml LV SV MOD A4C:     115.0 ml LV SV MOD BP:      40.3 ml RIGHT VENTRICLE RV Mid diam:    3.00 cm RV S prime:     8.38 cm/s TAPSE (M-mode): 1.8 cm LEFT ATRIUM             Index       RIGHT ATRIUM           Index LA diam:        3.80 cm 2.04 cm/m  RA Area:     21.20 cm LA Vol (A2C):   73.4 ml 39.33 ml/m RA Volume:   59.30 ml  31.77 ml/m LA Vol (A4C):   76.5 ml 40.99 ml/m LA Biplane Vol: 75.3 ml 40.35 ml/m  AORTIC VALVE                PULMONIC VALVE AV Area (Vmax): 1.28 cm    PV Vmax:        1.10 m/s AV Vmax:        152.00 cm/s PV Peak grad:   4.8 mmHg AV Peak Grad:   9.2 mmHg    RVOT Peak grad: 3 mmHg LVOT Vmax:      85.70 cm/s  AORTA Ao Root diam: 2.90 cm MITRAL VALVE                TRICUSPID VALVE MV Area (PHT): 3.89 cm     TR Peak grad:   32.7 mmHg MV Decel Time: 195 msec     TR Vmax:        286.00 cm/s MV E velocity: 140.00 cm/s MV A velocity: 56.90 cm/s   SHUNTS MV E/A ratio:  2.46         Systemic Diam: 1.70 cm MV A Prime:    7.5 cm/s Kate Sable MD Electronically signed by Kate Sable MD Signature Date/Time: 06/13/2021/5:56:08 PM    Final  EKG: Sinus rhythm with nonspecific ST abnormalities  ASSESSMENT AND PLAN:   1.  Chronic systolic congestive heart failure, improved after dialysis, 2D echocardiogram revealed LVEF 35 to 40%, which is likely not significantly changed compared to prior 2D echocardiogram 2.  Coronary artery disease, status post CABG times 03/2017, no chest pain, negative troponin 3.  End-stage renal disease, on chronic hemodialysis functioning AVG 4.  Essential hypertension, blood pressure well controlled  Recommendations  1.  Agree with overall current therapy 2.  Continue carvedilol, isosorbide mononitrate, spironolactone for chronic systolic congestive heart failure 3.  Continue amlodipine, clonidine for essential hypertension 4.  Defer further cardiac diagnostics at this time 5.  Follow-up with Dr. Clayborn Bigness in 1 to 2 weeks   Signed: Isaias Cowman MD,PhD, [facility] 06/14/2021, 8:50 AM

## 2021-06-14 NOTE — Plan of Care (Signed)
Continuing with plan of care. 

## 2021-06-14 NOTE — Progress Notes (Signed)
PROGRESS NOTE  Terry Arroyo HAL:[id number] DOB: 1960-12-31 DOA: 06/11/2021 PCP: Kirk Ruths, MD  HPI/Recap of past 24 hours: Terry Arroyo is a 60 y.o. female with medical history significant for CAD status post CABG 2018, HTN, HLD, ESRD M/W/F presents to the ED with chief concerns of shortness of breath. She reports that she had her full dose of dialysis session on 06/10/2021, felt better initially, but worsened. She reports that the SOB has been gradually worsening over the last three weeks, has not been able to lay down to sleep at night and has been sitting up in a chair to sleep. Pt states that she still makes urine about 3 times a day. In the ED, vital signs stable, including saturating about 97% on room air.  Labs fairly unremarkable.  Troponin 15--> 14.  COVID/influenza were negative.  Patient admitted for further management     Today, patient denies any new complaints.  Patient stable to discharge, but having some social issues (locked out of her apartment).  Plan to DC on 06/15/2021 after HD.  Patient denies any worsening shortness of breath, chest pain, nausea/vomiting, fever/chills.    Assessment/Plan: Principal Problem:   Fluid overload Active Problems:   Hyperglycemia   Hypertension associated with diabetes (HCC)   HFrEF (heart failure with reduced ejection fraction) (HCC)   CAD (coronary artery disease)   Hyperlipidemia associated with type 2 diabetes mellitus ([place])   ESRD (end stage renal disease) (HCC)   Anemia in chronic kidney disease   Blind left eye   Diabetic peripheral angiopathy (HCC)   DVT (deep venous thrombosis) (HCC)   Depression   Mixed hyperlipidemia   Hypokalemia   Fluid overload in setting of ESRD on HD- M/W/F (still making some urine) Currently saturating well on room air Nephrology consulted, HD as needed for fluid management Vascular surgery consulted, s/p removal of TDC on 7/35/32  Chronic systolic and diastolic HF Overloaded on  admission CXR consistent with CHF as most HD patients BNP 3,501 Echo done showed EF of 35 to 40%, regional wall motion abnormality, slight change from previous Cardiology consulted, agree with current management, no further recommendations.  Plan to follow-up with Dr. Clayborn Bigness in 1 to 2 weeks Management with HD as above  History of CAD status post CABG in 2018 HLD Currently chest pain free Troponin WNL with a flat trend EKG with incomplete left bundle branch block ECHO noted above Continue Spironolactone 25 mg daily carvedilol 25 mg twice daily, Imdur 30 mg daily Atorvastatin, fenofibrate  History of COPD Appears compensated and not in acute exacerbation at this time  Anemia of chronic kidney disease Baseline hemoglobin between 10-11, on admission 8.6 Anemia panel showed iron 43, sats 21, ferritin 343 Daily CBC   Hypertension Continue amlodipine, carvedilol 25 mg twice daily, Clonidine 0.1 mg p.o. twice daily, resumed on nondialysis day, imdur, Spironolactone   Diabetes mellitus type 2, uncontrolled  Last A1c 9.6 on 06/11/2021 SSI, NovoLog Mix 70/30, Accu-Cheks, hypoglycemic protocol   Low back pain Likely musculoskeletal Lidocaine patches for now   GERD PPI   Estimated body mass index is 27.44 kg/m as calculated from the following:   Height as of this encounter: 5\' 6"  (1.676 m).   Weight as of this encounter: 77.1 kg.     Code Status: Full  Family Communication: None at bedside  Disposition Plan: Status is: Observation  The patient will require care spanning > 2 midnights and should be moved to inpatient because: Inpatient  level of care appropriate due to severity of illness  Dispo: The patient is from: Home              Anticipated d/c is to: Home              Patient currently is not medically stable to d/c.   Difficult to place patient No    Consultants: Nephrology Vascular surgery Cardiology  Procedures: Tunneled dialysis catheter removed on  06/12/2021  Antimicrobials: None  DVT prophylaxis: Heparin [place]   Objective: Vitals:   06/13/21 1949 06/14/21 0537 06/14/21 0746 06/14/21 1617  BP: 112/60 (!) 119/58 (!) 126/57 113/61  Pulse: 64 60 60 (!) 59  Resp: 18 16 16 16   Temp: 98.3 F (36.8 C) 98.1 F (36.7 C) 98.4 F (36.9 C) (!) 97.4 F (36.3 C)  TempSrc: Oral     SpO2: 100% 94% 99% 100%  Weight:      Height:        Intake/Output Summary (Last 24 hours) at 06/14/2021 1652 Last data filed at 06/14/2021 1333 Gross per 24 hour  Intake 600 ml  Output --  Net 600 ml   Filed Weights   06/11/21 0813  Weight: 77.1 kg    Exam: General: NAD, chronically ill-appearing Cardiovascular: S1, S2 present Respiratory: CTAB Abdomen: Soft, nontender, nondistended, bowel sounds present Musculoskeletal: No bilateral pedal edema noted, right AVG, right TDC Skin: Normal Psychiatry: Normal mood    Data Reviewed: CBC: Recent Labs  Lab 06/11/21 0844 06/12/21 0644 06/13/21 0549 06/14/21 0607  WBC 7.6 7.3 6.7 5.9  NEUTROABS  --   --  3.8 3.3  HGB 8.6* 8.2* 8.1* 8.1*  HCT 26.4* 25.9* 25.4* 24.0*  MCV 88.0 89.6 89.1 86.3  PLT 255 259 256 998   Basic Metabolic Panel: Recent Labs  Lab 06/11/21 0844 06/12/21 0644 06/13/21 0549 06/14/21 0607  NA 135 136 134* 134*  K 2.9* 3.9 3.6 3.6  CL 95* 98 97* 96*  CO2 29 30 30 27   GLUCOSE 324* 318* 254* 267*  BUN 17 18 23* 44*  CREATININE 4.23* 3.94* 3.96* 5.37*  CALCIUM 7.8* 8.1* 8.2* 8.2*   GFR: Estimated Creatinine Clearance: 12 mL/min (A) (by C-G formula based on SCr of 5.37 mg/dL (H)). Liver Function Tests: No results for input(s): AST, ALT, ALKPHOS, BILITOT, PROT, ALBUMIN in the last 168 hours. No results for input(s): LIPASE, AMYLASE in the last 168 hours. No results for input(s): AMMONIA in the last 168 hours. Coagulation Profile: No results for input(s): INR, PROTIME in the last 168 hours. Cardiac Enzymes: No results for input(s): CKTOTAL, CKMB, CKMBINDEX,  TROPONINI in the last 168 hours. BNP (last 3 results) No results for input(s): PROBNP in the last 8760 hours. HbA1C: No results for input(s): HGBA1C in the last 72 hours.  CBG: Recent Labs  Lab 06/13/21 1629 06/13/21 2102 06/14/21 0743 06/14/21 1143 06/14/21 1615  GLUCAP 191* 175* 274* 151* 122*   Lipid Profile: No results for input(s): CHOL, HDL, LDLCALC, TRIG, CHOLHDL, LDLDIRECT in the last 72 hours. Thyroid Function Tests: No results for input(s): TSH, T4TOTAL, FREET4, T3FREE, THYROIDAB in the last 72 hours. Anemia Panel: Recent Labs    06/13/21 0549  VITAMINB12 365  FOLATE 10.2  FERRITIN 343*  TIBC 203*  IRON 43   Urine analysis:    Component Value Date/Time   COLORURINE STRAW (A) 10/21/2020 0829   APPEARANCEUR CLEAR (A) 10/21/2020 0829   LABSPEC 1.007 10/21/2020 0829   PHURINE 7.0 10/21/2020 0829  GLUCOSEU 150 (A) 10/21/2020 0829   HGBUR NEGATIVE 10/21/2020 0829   BILIRUBINUR NEGATIVE 10/21/2020 0829   KETONESUR NEGATIVE 10/21/2020 0829   PROTEINUR >=300 (A) 10/21/2020 0829   NITRITE NEGATIVE 10/21/2020 0829   LEUKOCYTESUR SMALL (A) 10/21/2020 0829   Sepsis Labs: @LABRCNTIP (procalcitonin:4,lacticidven:4)  ) Recent Results (from the past 240 hour(s))  Resp Panel by RT-PCR (Flu A&B, Covid) Nasopharyngeal Swab     Status: None   Collection Time: 06/11/21 10:28 AM   Specimen: Nasopharyngeal Swab; Nasopharyngeal(NP) swabs in vial transport medium  Result Value Ref Range Status   SARS Coronavirus 2 by RT PCR NEGATIVE NEGATIVE Final    Comment: (NOTE) SARS-CoV-2 target nucleic acids are NOT DETECTED.  The SARS-CoV-2 RNA is generally detectable in upper respiratory specimens during the acute phase of infection. The lowest concentration of SARS-CoV-2 viral copies this assay can detect is 138 copies/mL. A negative result does not preclude SARS-Cov-2 infection and should not be used as the sole basis for treatment or other patient management decisions. A  negative result may occur with  improper specimen collection/handling, submission of specimen other than nasopharyngeal swab, presence of viral mutation(s) within the areas targeted by this assay, and inadequate number of viral copies(<138 copies/mL). A negative result must be combined with clinical observations, patient history, and epidemiological information. The expected result is Negative.  Fact Sheet for Patients:  EntrepreneurPulse.com.au  Fact Sheet for Healthcare Providers:  IncredibleEmployment.be  This test is no t yet approved or cleared by the [place] FDA and  has been authorized for detection and/or diagnosis of SARS-CoV-2 by FDA under an Emergency Use Authorization (EUA). This EUA will remain  in effect (meaning this test can be used) for the duration of the COVID-19 declaration under Section 564(b)(1) of the Act, 21 U.S.C.section 360bbb-3(b)(1), unless the authorization is terminated  or revoked sooner.       Influenza A by PCR NEGATIVE NEGATIVE Final   Influenza B by PCR NEGATIVE NEGATIVE Final    Comment: (NOTE) The Xpert Xpress SARS-CoV-2/FLU/RSV plus assay is intended as an aid in the diagnosis of influenza from Nasopharyngeal swab specimens and should not be used as a sole basis for treatment. Nasal washings and aspirates are unacceptable for Xpert Xpress SARS-CoV-2/FLU/RSV testing.  Fact Sheet for Patients: EntrepreneurPulse.com.au  Fact Sheet for Healthcare Providers: IncredibleEmployment.be  This test is not yet approved or cleared by the [place] FDA and has been authorized for detection and/or diagnosis of SARS-CoV-2 by FDA under an Emergency Use Authorization (EUA). This EUA will remain in effect (meaning this test can be used) for the duration of the COVID-19 declaration under Section 564(b)(1) of the Act, 21 U.S.C. section 360bbb-3(b)(1), unless the authorization  is terminated or revoked.  Performed at [facility], [address] [place], [place] [address]       Studies: ECHOCARDIOGRAM COMPLETE  Result Date: 06/13/2021    ECHOCARDIOGRAM REPORT   Patient Name:   PARI LOMBARD Date of Exam: 06/13/2021 Medical Rec #:  [id number]       Height:       66.0 in Accession #:    [id number]      Weight:       170.0 lb Date of Birth:  [birth year]        BSA:          1.866 m Patient Age:    35 years        BP:  130/60 mmHg Patient Gender: F               HR:           64 bpm. Exam Location:  [facility] Procedure: 2D Echo, Color Doppler and Cardiac Doppler Indications:     Dyspnea R06.00  History:         Patient has prior history of Echocardiogram examinations. CAD,                  Stroke; Signs/Symptoms:Dyspnea.  Sonographer:     Alyse Low Roar Referring Phys:  [place] Diagnosing Phys: Kate Sable MD IMPRESSIONS  1. Left ventricular ejection fraction, by estimation, is 35 to 40%. The left ventricle has moderately decreased function. The left ventricle demonstrates regional wall motion abnormalities (see scoring diagram/findings for description). There is mild left ventricular hypertrophy. Left ventricular diastolic parameters are consistent with Grade II diastolic dysfunction (pseudonormalization). There is severe hypokinesis of the left ventricular, entire inferolateral wall.  2. Right ventricular systolic function is mildly reduced. The right ventricular size is normal.  3. Left atrial size was mild to moderately dilated.  4. Right atrial size was mildly dilated.  5. The mitral valve is degenerative. Mild mitral valve regurgitation.  6. Tricuspid valve regurgitation is mild to moderate.  7. The aortic valve is tricuspid. Aortic valve regurgitation is not visualized. FINDINGS  Left Ventricle: Left ventricular ejection fraction, by estimation, is 35 to 40%. The left ventricle has moderately decreased function. The left ventricle demonstrates  regional wall motion abnormalities. Severe hypokinesis of the left ventricular, entire  inferolateral wall. The left ventricular internal cavity size was normal in size. There is mild left ventricular hypertrophy. Left ventricular diastolic parameters are consistent with Grade II diastolic dysfunction (pseudonormalization). Right Ventricle: The right ventricular size is normal. No increase in right ventricular wall thickness. Right ventricular systolic function is mildly reduced. Left Atrium: Left atrial size was mild to moderately dilated. Right Atrium: Right atrial size was mildly dilated. Pericardium: There is no evidence of pericardial effusion. Mitral Valve: The mitral valve is degenerative in appearance. There is mild thickening of the mitral valve leaflet(s). There is moderate calcification of the mitral valve leaflet(s). Mild mitral valve regurgitation. Tricuspid Valve: The tricuspid valve is normal in structure. Tricuspid valve regurgitation is mild to moderate. Aortic Valve: The aortic valve is tricuspid. Aortic valve regurgitation is not visualized. Aortic valve peak gradient measures 9.2 mmHg. Pulmonic Valve: The pulmonic valve was normal in structure. Pulmonic valve regurgitation is mild. Aorta: The aortic root is normal in size and structure. Venous: The inferior vena cava was not well visualized. IAS/Shunts: No atrial level shunt detected by color flow Doppler.  LEFT VENTRICLE PLAX 2D LVIDd:         5.00 cm      Diastology LVIDs:         4.20 cm      LV e' medial:    4.90 cm/s LV PW:         1.20 cm      LV E/e' medial:  28.6 LV IVS:        1.20 cm      LV e' lateral:   4.35 cm/s LVOT diam:     1.70 cm      LV E/e' lateral: 32.2 LVOT Area:     2.27 cm  LV Volumes (MOD) LV vol d, MOD A2C: 110.0 ml LV vol d, MOD A4C: 115.0 ml LV vol s, MOD  A2C: 71.2 ml LV vol s, MOD A4C: 74.2 ml LV SV MOD A2C:     38.8 ml LV SV MOD A4C:     115.0 ml LV SV MOD BP:      40.3 ml RIGHT VENTRICLE RV Mid diam:    3.00 cm RV  S prime:     8.38 cm/s TAPSE (M-mode): 1.8 cm LEFT ATRIUM             Index       RIGHT ATRIUM           Index LA diam:        3.80 cm 2.04 cm/m  RA Area:     21.20 cm LA Vol (A2C):   73.4 ml 39.33 ml/m RA Volume:   59.30 ml  31.77 ml/m LA Vol (A4C):   76.5 ml 40.99 ml/m LA Biplane Vol: 75.3 ml 40.35 ml/m  AORTIC VALVE                PULMONIC VALVE AV Area (Vmax): 1.28 cm    PV Vmax:        1.10 m/s AV Vmax:        152.00 cm/s PV Peak grad:   4.8 mmHg AV Peak Grad:   9.2 mmHg    RVOT Peak grad: 3 mmHg LVOT Vmax:      85.70 cm/s  AORTA Ao Root diam: 2.90 cm MITRAL VALVE                TRICUSPID VALVE MV Area (PHT): 3.89 cm     TR Peak grad:   32.7 mmHg MV Decel Time: 195 msec     TR Vmax:        286.00 cm/s MV E velocity: 140.00 cm/s MV A velocity: 56.90 cm/s   SHUNTS MV E/A ratio:  2.46         Systemic Diam: 1.70 cm MV A Prime:    7.5 cm/s Kate Sable MD Electronically signed by Kate Sable MD Signature Date/Time: 06/13/2021/5:56:08 PM    Final     Scheduled Meds:  amLODipine  10 mg Oral Daily   aspirin EC  81 mg Oral Daily   atorvastatin  80 mg Oral q1800   carvedilol  25 mg Oral BID   cloNIDine  0.1 mg Oral BID   fenofibrate  160 mg Oral Daily   heparin  5,000 Units Subcutaneous Q8H   insulin aspart  0-5 Units Subcutaneous QHS   insulin aspart  0-6 Units Subcutaneous TID WC   insulin aspart  4 Units Subcutaneous TID WC   insulin aspart protamine- aspart  15 Units Subcutaneous BID WC   isosorbide mononitrate  30 mg Oral Daily   lidocaine  2 patch Transdermal Q24H   mirtazapine  30 mg Oral QHS   pantoprazole  80 mg Oral Daily   spironolactone  25 mg Oral Daily   topiramate  25 mg Oral Daily   traZODone  50 mg Oral QHS   vitamin B-12  1,000 mcg Oral Daily    Continuous Infusions:   LOS: 0 days     Alma Friendly, MD [facility]  If 7PM-7AM, please contact night-coverage [email or web address] 06/14/2021, 4:52 PM

## 2021-06-15 ENCOUNTER — Encounter (INDEPENDENT_AMBULATORY_CARE_PROVIDER_SITE_OTHER): Payer: Self-pay | Admitting: Nurse Practitioner

## 2021-06-15 DIAGNOSIS — N186 End stage renal disease: Secondary | ICD-10-CM | POA: Diagnosis not present

## 2021-06-15 DIAGNOSIS — E785 Hyperlipidemia, unspecified: Secondary | ICD-10-CM | POA: Diagnosis not present

## 2021-06-15 DIAGNOSIS — I25118 Atherosclerotic heart disease of native coronary artery with other forms of angina pectoris: Secondary | ICD-10-CM | POA: Diagnosis not present

## 2021-06-15 DIAGNOSIS — E8779 Other fluid overload: Secondary | ICD-10-CM | POA: Diagnosis not present

## 2021-06-15 DIAGNOSIS — I10 Essential (primary) hypertension: Secondary | ICD-10-CM | POA: Diagnosis not present

## 2021-06-15 DIAGNOSIS — E1169 Type 2 diabetes mellitus with other specified complication: Secondary | ICD-10-CM | POA: Diagnosis not present

## 2021-06-15 DIAGNOSIS — I5020 Unspecified systolic (congestive) heart failure: Secondary | ICD-10-CM | POA: Diagnosis not present

## 2021-06-15 DIAGNOSIS — D631 Anemia in chronic kidney disease: Secondary | ICD-10-CM | POA: Diagnosis not present

## 2021-06-15 DIAGNOSIS — I152 Hypertension secondary to endocrine disorders: Secondary | ICD-10-CM | POA: Diagnosis not present

## 2021-06-15 DIAGNOSIS — E1159 Type 2 diabetes mellitus with other circulatory complications: Secondary | ICD-10-CM | POA: Diagnosis not present

## 2021-06-15 DIAGNOSIS — N2581 Secondary hyperparathyroidism of renal origin: Secondary | ICD-10-CM | POA: Diagnosis not present

## 2021-06-15 DIAGNOSIS — E876 Hypokalemia: Secondary | ICD-10-CM | POA: Diagnosis not present

## 2021-06-15 LAB — BASIC METABOLIC PANEL
Anion gap: 9 (ref 5–15)
BUN: 59 mg/dL — ABNORMAL HIGH (ref 6–20)
CO2: 27 mmol/L (ref 22–32)
Calcium: 8.4 mg/dL — ABNORMAL LOW (ref 8.9–10.3)
Chloride: 99 mmol/L (ref 98–111)
Creatinine, Ser: 6.44 mg/dL — ABNORMAL HIGH (ref 0.44–1.00)
GFR, Estimated: 7 mL/min — ABNORMAL LOW (ref >60)
Glucose, Bld: 276 mg/dL — ABNORMAL HIGH (ref 70–99)
Potassium: 3.9 mmol/L (ref 3.5–5.1)
Sodium: 135 mmol/L (ref 135–145)

## 2021-06-15 LAB — CBC WITH DIFFERENTIAL/PLATELET
Abs Immature Granulocytes: 0.02 10*3/uL (ref 0.00–0.07)
Basophils Absolute: 0.1 10*3/uL (ref 0.0–0.1)
Basophils Relative: 1 %
Eosinophils Absolute: 0.4 10*3/uL (ref 0.0–0.5)
Eosinophils Relative: 6 %
HCT: 23.6 % — ABNORMAL LOW (ref 36.0–46.0)
Hemoglobin: 7.7 g/dL — ABNORMAL LOW (ref 12.0–15.0)
Immature Granulocytes: 0 %
Lymphocytes Relative: 23 %
Lymphs Abs: 1.5 10*3/uL (ref 0.7–4.0)
MCH: 28.9 pg (ref 26.0–34.0)
MCHC: 32.6 g/dL (ref 30.0–36.0)
MCV: 88.7 fL (ref 80.0–100.0)
Monocytes Absolute: 0.5 10*3/uL (ref 0.1–1.0)
Monocytes Relative: 8 %
Neutro Abs: 3.9 10*3/uL (ref 1.7–7.7)
Neutrophils Relative %: 62 %
Platelets: 236 10*3/uL (ref 150–400)
RBC: 2.66 MIL/uL — ABNORMAL LOW (ref 3.87–5.11)
RDW: 14.8 % (ref 11.5–15.5)
WBC: 6.3 10*3/uL (ref 4.0–10.5)
nRBC: 0 % (ref 0.0–0.2)

## 2021-06-15 LAB — GLUCOSE, CAPILLARY
Glucose-Capillary: 296 mg/dL — ABNORMAL HIGH (ref 70–99)
Glucose-Capillary: 243 mg/dL — ABNORMAL HIGH (ref 70–99)
Glucose-Capillary: 112 mg/dL — ABNORMAL HIGH (ref 70–99)
Glucose-Capillary: 105 mg/dL — ABNORMAL HIGH (ref 70–99)

## 2021-06-15 MED ORDER — EPOETIN ALFA 10000 UNIT/ML IJ SOLN
10000.0000 [IU] | INTRAMUSCULAR | Status: DC
  Administered 2021-06-15: 10000 [IU] via SUBCUTANEOUS
  Filled 2021-06-15: qty 1

## 2021-06-15 MED ORDER — SPIRONOLACTONE 25 MG PO TABS: 25.0000 mg | ORAL_TABLET | Freq: Every day | ORAL | 0 refills | Status: DC

## 2021-06-15 NOTE — Consult Note (Addendum)
   Heart Failure Nurse Navigator Note  Hfr EF 35-40%.  Grade 2 diastolic dysfunction.  Normal right ventricular systolic.  Comorbidities:  Coronary artery disease coronary artery bypass Hypertension Hyperlipidemia End-stage renal disease on dialysis  Medications:  Norvasc 10 mg daily  aspirin 81 mg Atorvastatin 80 mg daily Carvedilol 25 mg twice a day Clonidine 0.1 mg twice a day Fenofibrate 160 mg daily Isosorbide mononitrate 30 mg daily Spironolactone 25 mg daily  Labs:  Sodium 135, 99, CO2 27, BUN 59, creatinine 6.4 Weight today is 77.1   Met with patient today, she states she is very tired, did not sleep well last night.  Discussed her fluid restriction, does not go over a liter daily, but she is using a new seasoning "goya" and questions if that is not the problem,  instructed to look at the label and check for sodium content.  Discussed her up coming appointment with the heart failure Clinic,July 28 at 1200.  Pricilla Riffle RN [facility]

## 2021-06-15 NOTE — Progress Notes (Addendum)
[facility]  ROUNDING NOTE   Subjective:   [name] was admitted to [facility] on 06/11/2021 for Hypokalemia [E87.6] Acute pulmonary edema ([place]) [J81.0] ESRD (end stage renal disease) ([place]) [N18.6] Fluid overload [E87.70]  Patient seen sitting at side of bed Alert, oriented, states she is drowsy Denies shortness of breath and chest pains Tolerating meals  HEMODIALYSIS FLOWSHEET:  Blood Flow Rate (mL/min): 400 mL/min Arterial Pressure (mmHg): -200 mmHg Venous Pressure (mmHg): 150 mmHg Transmembrane Pressure (mmHg): 60 mmHg Ultrafiltration Rate (mL/min): 60 mL/min Dialysate Flow Rate (mL/min): 600 ml/min Conductivity: Machine : 13.5 Conductivity: Machine : 13.5 Dialysis Fluid Bolus: Normal Saline Bolus Amount (mL): 200 mL   Objective:  Vital signs in last 24 hours:  Temp:  [97.4 F (36.3 C)-97.9 F (36.6 C)] 97.9 F (36.6 C) (06/27 0756) Pulse Rate:  [59-62] 62 (06/27 0756) Resp:  [16-18] 16 (06/27 0756) BP: (113-126)/(60-66) 126/66 (06/27 0756) SpO2:  [96 %-100 %] 100 % (06/27 0756)  Weight change:  Filed Weights   06/11/21 0813  Weight: 77.1 kg    Intake/Output: I/O last 3 completed shifts: In: 600 [P.O.:600] Out: -    Intake/Output this shift:  Total I/O In: 120 [P.O.:120] Out: -   Physical Exam: General: NAD, sitting at bedside  Head: Normocephalic, atraumatic. Moist oral mucosal membranes  Eyes: Anicteric  Lungs:  Clear bilaterally, normal breathing effort  Heart: Regular rate and rhythm  Abdomen:  Soft, nontender  Extremities:  no peripheral edema.  Neurologic: Nonfocal, moving all four extremities  Skin: No lesions  Access: Right AVG    Basic Metabolic Panel: Recent Labs  Lab 06/11/21 0844 06/12/21 0644 06/13/21 0549 06/14/21 0607 06/15/21 0654  NA 135 136 134* 134* 135  K 2.9* 3.9 3.6 3.6 3.9  CL 95* 98 97* 96* 99  CO2 29 30 30 27 27   GLUCOSE 324* 318* 254* 267* 276*  BUN 17 18 23* 44* 59*  CREATININE 4.23*  3.94* 3.96* 5.37* 6.44*  CALCIUM 7.8* 8.1* 8.2* 8.2* 8.4*     Liver Function Tests: No results for input(s): AST, ALT, ALKPHOS, BILITOT, PROT, ALBUMIN in the last 168 hours. No results for input(s): LIPASE, AMYLASE in the last 168 hours. No results for input(s): AMMONIA in the last 168 hours.  CBC: Recent Labs  Lab 06/11/21 0844 06/12/21 0644 06/13/21 0549 06/14/21 0607 06/15/21 0654  WBC 7.6 7.3 6.7 5.9 6.3  NEUTROABS  --   --  3.8 3.3 3.9  HGB 8.6* 8.2* 8.1* 8.1* 7.7*  HCT 26.4* 25.9* 25.4* 24.0* 23.6*  MCV 88.0 89.6 89.1 86.3 88.7  PLT 255 259 256 225 236     Cardiac Enzymes: No results for input(s): CKTOTAL, CKMB, CKMBINDEX, TROPONINI in the last 168 hours.  BNP: Invalid input(s): POCBNP  CBG: Recent Labs  Lab 06/14/21 1143 06/14/21 1615 06/14/21 2139 06/15/21 0748 06/15/21 1135  GLUCAP 151* 122* 229* 296* 243*     Microbiology: Results for orders placed or performed during the hospital encounter of 06/11/21  Resp Panel by RT-PCR (Flu A&B, Covid) Nasopharyngeal Swab     Status: None   Collection Time: 06/11/21 10:28 AM   Specimen: Nasopharyngeal Swab; Nasopharyngeal(NP) swabs in vial transport medium  Result Value Ref Range Status   SARS Coronavirus 2 by RT PCR NEGATIVE NEGATIVE Final    Comment: (NOTE) SARS-CoV-2 target nucleic acids are NOT DETECTED.  The SARS-CoV-2 RNA is generally detectable in upper respiratory specimens during the acute phase of infection. The lowest concentration  of SARS-CoV-2 viral copies this assay can detect is 138 copies/mL. A negative result does not preclude SARS-Cov-2 infection and should not be used as the sole basis for treatment or other patient management decisions. A negative result may occur with  improper specimen collection/handling, submission of specimen other than nasopharyngeal swab, presence of viral mutation(s) within the areas targeted by this assay, and inadequate number of viral copies(<138 copies/mL).  A negative result must be combined with clinical observations, patient history, and epidemiological information. The expected result is Negative.  Fact Sheet for Patients:  EntrepreneurPulse.com.au  Fact Sheet for Healthcare Providers:  IncredibleEmployment.be  This test is no t yet approved or cleared by the [place] FDA and  has been authorized for detection and/or diagnosis of SARS-CoV-2 by FDA under an Emergency Use Authorization (EUA). This EUA will remain  in effect (meaning this test can be used) for the duration of the COVID-19 declaration under Section 564(b)(1) of the Act, 21 U.S.C.section 360bbb-3(b)(1), unless the authorization is terminated  or revoked sooner.       Influenza A by PCR NEGATIVE NEGATIVE Final   Influenza B by PCR NEGATIVE NEGATIVE Final    Comment: (NOTE) The Xpert Xpress SARS-CoV-2/FLU/RSV plus assay is intended as an aid in the diagnosis of influenza from Nasopharyngeal swab specimens and should not be used as a sole basis for treatment. Nasal washings and aspirates are unacceptable for Xpert Xpress SARS-CoV-2/FLU/RSV testing.  Fact Sheet for Patients: EntrepreneurPulse.com.au  Fact Sheet for Healthcare Providers: IncredibleEmployment.be  This test is not yet approved or cleared by the [place] FDA and has been authorized for detection and/or diagnosis of SARS-CoV-2 by FDA under an Emergency Use Authorization (EUA). This EUA will remain in effect (meaning this test can be used) for the duration of the COVID-19 declaration under Section 564(b)(1) of the Act, 21 U.S.C. section 360bbb-3(b)(1), unless the authorization is terminated or revoked.  Performed at [facility], [address] [place], [place] [address]     Coagulation Studies: No results for input(s): LABPROT, INR in the last 72 hours.  Urinalysis: No results for input(s): COLORURINE,  LABSPEC, PHURINE, GLUCOSEU, HGBUR, BILIRUBINUR, KETONESUR, PROTEINUR, UROBILINOGEN, NITRITE, LEUKOCYTESUR in the last 72 hours.  Invalid input(s): APPERANCEUR    Imaging: ECHOCARDIOGRAM COMPLETE  Result Date: 06/13/2021    ECHOCARDIOGRAM REPORT   Patient Name:   Terry Arroyo Date of Exam: 06/13/2021 Medical Rec #:  [id number]       Height:       66.0 in Accession #:    [id number]      Weight:       170.0 lb Date of Birth:  11-04-1961        BSA:          1.866 m Patient Age:    86 years        BP:           130/60 mmHg Patient Gender: F               HR:           64 bpm. Exam Location:  [facility] Procedure: 2D Echo, Color Doppler and Cardiac Doppler Indications:     Dyspnea R06.00  History:         Patient has prior history of Echocardiogram examinations. CAD,                  Stroke; Signs/Symptoms:Dyspnea.  Sonographer:     Alyse Low Roar Referring Phys:  [place] Diagnosing Phys: Kate Sable MD IMPRESSIONS  1. Left ventricular ejection fraction, by estimation, is 35 to 40%. The left ventricle has moderately decreased function. The left ventricle demonstrates regional wall motion abnormalities (see scoring diagram/findings for description). There is mild left ventricular hypertrophy. Left ventricular diastolic parameters are consistent with Grade II diastolic dysfunction (pseudonormalization). There is severe hypokinesis of the left ventricular, entire inferolateral wall.  2. Right ventricular systolic function is mildly reduced. The right ventricular size is normal.  3. Left atrial size was mild to moderately dilated.  4. Right atrial size was mildly dilated.  5. The mitral valve is degenerative. Mild mitral valve regurgitation.  6. Tricuspid valve regurgitation is mild to moderate.  7. The aortic valve is tricuspid. Aortic valve regurgitation is not visualized. FINDINGS  Left Ventricle: Left ventricular ejection fraction, by estimation, is 35 to 40%. The left ventricle has moderately decreased  function. The left ventricle demonstrates regional wall motion abnormalities. Severe hypokinesis of the left ventricular, entire  inferolateral wall. The left ventricular internal cavity size was normal in size. There is mild left ventricular hypertrophy. Left ventricular diastolic parameters are consistent with Grade II diastolic dysfunction (pseudonormalization). Right Ventricle: The right ventricular size is normal. No increase in right ventricular wall thickness. Right ventricular systolic function is mildly reduced. Left Atrium: Left atrial size was mild to moderately dilated. Right Atrium: Right atrial size was mildly dilated. Pericardium: There is no evidence of pericardial effusion. Mitral Valve: The mitral valve is degenerative in appearance. There is mild thickening of the mitral valve leaflet(s). There is moderate calcification of the mitral valve leaflet(s). Mild mitral valve regurgitation. Tricuspid Valve: The tricuspid valve is normal in structure. Tricuspid valve regurgitation is mild to moderate. Aortic Valve: The aortic valve is tricuspid. Aortic valve regurgitation is not visualized. Aortic valve peak gradient measures 9.2 mmHg. Pulmonic Valve: The pulmonic valve was normal in structure. Pulmonic valve regurgitation is mild. Aorta: The aortic root is normal in size and structure. Venous: The inferior vena cava was not well visualized. IAS/Shunts: No atrial level shunt detected by color flow Doppler.  LEFT VENTRICLE PLAX 2D LVIDd:         5.00 cm      Diastology LVIDs:         4.20 cm      LV e' medial:    4.90 cm/s LV PW:         1.20 cm      LV E/e' medial:  28.6 LV IVS:        1.20 cm      LV e' lateral:   4.35 cm/s LVOT diam:     1.70 cm      LV E/e' lateral: 32.2 LVOT Area:     2.27 cm  LV Volumes (MOD) LV vol d, MOD A2C: 110.0 ml LV vol d, MOD A4C: 115.0 ml LV vol s, MOD A2C: 71.2 ml LV vol s, MOD A4C: 74.2 ml LV SV MOD A2C:     38.8 ml LV SV MOD A4C:     115.0 ml LV SV MOD BP:      40.3 ml  RIGHT VENTRICLE RV Mid diam:    3.00 cm RV S prime:     8.38 cm/s TAPSE (M-mode): 1.8 cm LEFT ATRIUM             Index       RIGHT ATRIUM           Index  LA diam:        3.80 cm 2.04 cm/m  RA Area:     21.20 cm LA Vol (A2C):   73.4 ml 39.33 ml/m RA Volume:   59.30 ml  31.77 ml/m LA Vol (A4C):   76.5 ml 40.99 ml/m LA Biplane Vol: 75.3 ml 40.35 ml/m  AORTIC VALVE                PULMONIC VALVE AV Area (Vmax): 1.28 cm    PV Vmax:        1.10 m/s AV Vmax:        152.00 cm/s PV Peak grad:   4.8 mmHg AV Peak Grad:   9.2 mmHg    RVOT Peak grad: 3 mmHg LVOT Vmax:      85.70 cm/s  AORTA Ao Root diam: 2.90 cm MITRAL VALVE                TRICUSPID VALVE MV Area (PHT): 3.89 cm     TR Peak grad:   32.7 mmHg MV Decel Time: 195 msec     TR Vmax:        286.00 cm/s MV E velocity: 140.00 cm/s MV A velocity: 56.90 cm/s   SHUNTS MV E/A ratio:  2.46         Systemic Diam: 1.70 cm MV A Prime:    7.5 cm/s Kate Sable MD Electronically signed by Kate Sable MD Signature Date/Time: 06/13/2021/5:56:08 PM    Final      Medications:    sodium chloride     sodium chloride      amLODipine  10 mg Oral Daily   aspirin EC  81 mg Oral Daily   atorvastatin  80 mg Oral q1800   carvedilol  25 mg Oral BID   Chlorhexidine Gluconate Cloth  6 each Topical Q0600   cloNIDine  0.1 mg Oral BID   fenofibrate  160 mg Oral Daily   heparin  5,000 Units Subcutaneous Q8H   insulin aspart  0-5 Units Subcutaneous QHS   insulin aspart  0-6 Units Subcutaneous TID WC   insulin aspart  4 Units Subcutaneous TID WC   insulin aspart protamine- aspart  15 Units Subcutaneous BID WC   isosorbide mononitrate  30 mg Oral Daily   mirtazapine  30 mg Oral QHS   pantoprazole  80 mg Oral Daily   spironolactone  25 mg Oral Daily   topiramate  25 mg Oral Daily   traZODone  50 mg Oral QHS   vitamin B-12  1,000 mcg Oral Daily     Assessment/ Plan:  [name] is a 60 y.o. black female with end stage renal disease on hemodialysis,  hypertension, angioedema to ARB/ACE-I, COPD/asthma, diabetes mellitus type II, diabetic retinopathy with left eye blindness, coronary artery disease, depression, congestive heart failure status post CABG, DVT, hyperlipidemia, CVA who is admitted to [facility] on 06/11/2021 for Hypokalemia [E87.6] Acute pulmonary edema ([place]) [J81.0] ESRD (end stage renal disease) ([place]) [N18.6] Fluid overload [E87.70]  [facility] MWF [facility] Right AVG 79kg  Hypokalemia: continue potassium chloride. Spironolactone 25mg  daily. Hold furosemide.  End Stage Renal Disease: hemodialysis Scheduled for dialysis today. UF goal of 0-0.5 L. Next dialysis will be Wednesday   Hypertension: elevated and fluid driven, 932/67. Home regimen of amlodipine, carvedilol, furosemide and isosorbide mononitrate. Holding furosemide. Improved control  Secondary Hyperparathyroidism:  not currently on binders.   Anemia with chronic kidney disease: hemoglobin 7.7, normocytic. EPO with MWF HD  treatments outpatient. Will order while inpatient    LOS: 0 Josean Lycan 6/27/202212:00 PM

## 2021-06-15 NOTE — Discharge Summary (Signed)
Discharge Summary  Terry Arroyo UJW:[id number] DOB: 1961-05-02  PCP: Kirk Ruths, MD  Admit date: 06/11/2021 Discharge date: 06/15/2021  Time spent: 30 mins  Recommendations for Outpatient Follow-up:  PCP in 1 week Nephrology as scheduled  Discharge Diagnoses:  Active Hospital Problems   Diagnosis Date Noted   Fluid overload 06/11/2021   Hypokalemia 06/11/2021   ESRD (end stage renal disease) (HCC)    CAD (coronary artery disease) 10/21/2020   Hyperlipidemia associated with type 2 diabetes mellitus ([place]) 10/21/2020   HFrEF (heart failure with reduced ejection fraction) ([place])    Hypertension associated with diabetes ([place])    Blind left eye 09/03/2020   Mixed hyperlipidemia 09/03/2020   Anemia in chronic kidney disease 03/10/2020   Hyperglycemia    DVT (deep venous thrombosis) ([facility] [facility]) 12/30/2016   Diabetic peripheral angiopathy ([place]) 01/03/2016   Depression 08/30/2014    Resolved Hospital Problems  No resolved problems to display.    Discharge Condition: Stable  Diet recommendation: Renal diet  Vitals:   06/15/21 1415 06/15/21 1624  BP: 123/60 (!) 117/59  Pulse:  60  Resp:  16  Temp:  98.6 F (37 C)  SpO2:  100%    History of present illness:  Terry Arroyo is a 60 y.o. female with medical history significant for CAD status post CABG 2018, HTN, HLD, ESRD M/W/F presents to the ED with chief concerns of shortness of breath. She reports that she had her full dose of dialysis session on 06/10/2021, felt better initially, but worsened. She reports that the SOB has been gradually worsening over the last three weeks, has not been able to lay down to sleep at night and has been sitting up in a chair to sleep. Pt states that she still makes urine about 3 times a day. In the ED, vital signs stable, including saturating about 97% on room air.  Labs fairly unremarkable.  Troponin 15--> 14.  COVID/influenza were negative.  Patient admitted for further  management.   Today, saw patient before dialysis, felt tired, she did not sleep well last night, reports some mild shortness of breath, denies any chest pain/pressure, abdominal pain, nausea/vomiting, fever/chills.  Patient stable to be discharged with home health, but patient declined home health.    Hospital Course:  Principal Problem:   Fluid overload Active Problems:   Hyperglycemia   Hypertension associated with diabetes (HCC)   HFrEF (heart failure with reduced ejection fraction) (HCC)   CAD (coronary artery disease)   Hyperlipidemia associated with type 2 diabetes mellitus (HCC)   ESRD (end stage renal disease) (HCC)   Anemia in chronic kidney disease   Blind left eye   Diabetic peripheral angiopathy (HCC)   DVT (deep venous thrombosis) (HCC)   Depression   Mixed hyperlipidemia   Hypokalemia   Fluid overload in setting of ESRD on HD- M/W/F (still making some urine) Currently saturating well on room air Nephrology consulted, HD as needed for fluid management Vascular surgery consulted, s/p removal of TDC on 06/13/21 Continue outpatient dialysis, patient compliant   Chronic systolic and diastolic HF Overloaded on admission CXR consistent with CHF as most HD patients BNP 3,501 Echo done showed EF of 35 to 40%, regional wall motion abnormality, slight change from previous Cardiology consulted, agree with current management, no further recommendations.  Follow-up with Dr. Clayborn Bigness in 1 to 2 weeks Management with HD as above   History of CAD status post CABG in 2018 HLD Currently chest pain free Troponin WNL with  a flat trend EKG with incomplete left bundle branch block ECHO noted above Continue Spironolactone 25 mg daily carvedilol 25 mg twice daily, Imdur 30 mg daily Atorvastatin, fenofibrate   History of COPD Appears compensated and not in acute exacerbation at this time   Anemia of chronic kidney disease Baseline hemoglobin between 10-11, on admission  8.6 Anemia panel showed iron 43, sats 21, ferritin 343 Daily CBC   Hypertension Continue amlodipine, carvedilol 25 mg twice daily, Clonidine 0.1 mg p.o. twice daily, resumed on nondialysis day, imdur, Spironolactone   Diabetes mellitus type 2, uncontrolled Last A1c 9.6 on 06/11/2021 SSI, NovoLog Mix 70/30, Accu-Cheks, hypoglycemic protocol   Low back pain Likely musculoskeletal Lidocaine patches for now   GERD PPI      Estimated body mass index is 27.44 kg/m as calculated from the following:   Height as of this encounter: 5\' 6"  (1.676 m).   Weight as of this encounter: 77.1 kg.    Procedures: Tunneled dialysis catheter removed on 06/12/2021  Consultations: Nephrology Vascular surgery Cardiology   Discharge Exam: BP (!) 117/59 (BP Location: Left Arm)   Pulse 60   Temp 98.6 F (37 C) (Oral)   Resp 16   Ht 5\' 6"  (1.676 m)   Wt 77.1 kg   SpO2 100%   BMI 27.44 kg/m   General: NAD  Cardiovascular: S1, S2 present Respiratory: CTAB Abdomen: Soft, nontender, nondistended, bowel sounds present Musculoskeletal: No bilateral pedal edema noted Skin: Normal Psychiatry: Normal mood      Discharge Instructions You were cared for by a hospitalist during your hospital stay. If you have any questions about your discharge medications or the care you received while you were in the hospital after you are discharged, you can call the unit and asked to speak with the hospitalist on call if the hospitalist that took care of you is not available. Once you are discharged, your primary care physician will handle any further medical issues. Please note that NO REFILLS for any discharge medications will be authorized once you are discharged, as it is imperative that you return to your primary care physician (or establish a relationship with a primary care physician if you do not have one) for your aftercare needs so that they can reassess your need for medications and monitor your lab  values.  Discharge Instructions     Diet - low sodium heart healthy   Complete by: As directed    Increase activity slowly   Complete by: As directed       Allergies as of 06/15/2021       Reactions   Hydralazine Hives, Shortness Of Breath, Swelling, Rash   Body aches   Kiwi Extract Hives, Swelling, Rash   Mouth swells    Lisinopril Swelling   Angioedema   Shellfish Allergy Anaphylaxis   Shrimp/lobster  Betadine leaves welts on skin    Betadine [povidone Iodine] Hives, Rash   LEAVES WELTS ON SKIN   Metformin Diarrhea, Other (See Comments)   GI Intolerance   Tape Itching   Use paper tape whenever possible        Medication List     STOP taking these medications    HYDROcodone-acetaminophen 5-325 MG tablet Commonly known as: Norco       TAKE these medications    acetaminophen 500 MG tablet Commonly known as: TYLENOL Take 500-1,000 mg by mouth every 6 (six) hours as needed for mild pain or moderate pain.   albuterol 108 (90 Base)  MCG/ACT inhaler Commonly known as: VENTOLIN HFA Inhale 2 puffs into the lungs every 6 (six) hours as needed for wheezing or shortness of breath.   amLODipine 10 MG tablet Commonly known as: NORVASC Take 1 tablet (10 mg total) by mouth daily.   ammonium lactate 12 % lotion Commonly known as: LAC-HYDRIN Apply 1 application topically daily.   aspirin 81 MG EC tablet Take 1 tablet (81 mg total) by mouth daily.   atorvastatin 80 MG tablet Commonly known as: LIPITOR Take 1 tablet (80 mg total) by mouth daily at 6 PM.   bismuth subsalicylate 007 MA/26JF suspension Commonly known as: PEPTO BISMOL Take 30 mLs by mouth every 6 (six) hours as needed for indigestion or diarrhea or loose stools.   carvedilol 25 MG tablet Commonly known as: COREG Take 25 mg by mouth 2 (two) times daily.   cloNIDine 0.1 MG tablet Commonly known as: CATAPRES Take 0.1 mg by mouth 2 (two) times daily. (Non-dialysis days)   Dulera 200-5 MCG/ACT  Aero Generic drug: mometasone-formoterol Inhale 2 puffs into the lungs 2 (two) times daily.   fenofibrate 145 MG tablet Commonly known as: TRICOR Take 145 mg by mouth daily.   furosemide 40 MG tablet Commonly known as: LASIX Take 40 mg by mouth daily.   insulin aspart protamine- aspart (70-30) 100 UNIT/ML injection Commonly known as: NOVOLOG MIX 70/30 Inject 0.15 mLs (15 Units total) into the skin 2 (two) times daily with a meal.   isosorbide mononitrate 30 MG 24 hr tablet Commonly known as: IMDUR Take 30 mg by mouth daily.   loperamide 2 MG tablet Commonly known as: IMODIUM A-D Take 4 mg by mouth 4 (four) times daily as needed for diarrhea or loose stools.   mirtazapine 30 MG tablet Commonly known as: REMERON Take 1 tablet (30 mg total) by mouth at bedtime.   omeprazole 40 MG capsule Commonly known as: PRILOSEC Take 40 mg by mouth daily.   potassium chloride 10 MEQ tablet Commonly known as: KLOR-CON Take 10 mEq by mouth daily.   spironolactone 25 MG tablet Commonly known as: ALDACTONE Take 1 tablet (25 mg total) by mouth daily. Start taking on: June 16, 2021   topiramate 25 MG tablet Commonly known as: TOPAMAX Take 25 mg by mouth daily.   traZODone 50 MG tablet Commonly known as: DESYREL Take 50 mg by mouth at bedtime.   vitamin B-12 1000 MCG tablet Commonly known as: CYANOCOBALAMIN Take 1,000 mcg by mouth daily.       ASK your doctor about these medications    polyethylene glycol 17 g packet Commonly known as: MIRALAX / GLYCOLAX Take 17 g by mouth daily.       Allergies  Allergen Reactions   Hydralazine Hives, Shortness Of Breath, Swelling and Rash    Body aches    Kiwi Extract Hives, Swelling and Rash    Mouth swells    Lisinopril Swelling    Angioedema   Shellfish Allergy Anaphylaxis    Shrimp/lobster  Betadine leaves welts on skin     Betadine [Povidone Iodine] Hives and Rash    LEAVES WELTS ON SKIN   Metformin Diarrhea and Other  (See Comments)    GI Intolerance   Tape Itching    Use paper tape whenever possible    Follow-up Information     Kirk Ruths, MD. Schedule an appointment as soon as possible for a visit in 1 week(s).   Specialty: Internal Medicine Contact information: [address]  [place] [address] [phone]         Lujean Amel D, MD. Schedule an appointment as soon as possible for a visit in 1 week(s).   Specialties: Cardiology, Internal Medicine Contact information: [place] [address] [phone]                  The results of significant diagnostics from this hospitalization (including imaging, microbiology, ancillary and laboratory) are listed below for reference.    Significant Diagnostic Studies: DG Chest 2 View  Result Date: 06/11/2021 CLINICAL DATA:  Shortness of breath.  Back pain. EXAM: CHEST - 2 VIEW COMPARISON:  02/09/2021. FINDINGS: Right IJ line with tip again noted over the right atrium in unchanged position. Prior CABG. Stable cardiomegaly. Diffuse bilateral interstitial prominence and small bilateral pleural effusions. Findings suggest mild CHF. Similar findings noted on prior exam. No pneumothorax. IMPRESSION: 1. Right IJ line with tip again noted over the right atrium in unchanged position. 2. Prior CABG. Stable cardiomegaly. Diffuse bilateral interstitial prominence and small bilateral pleural effusions consistent with mild CHF. Similar findings noted on prior exam. Electronically Signed   By: [place]   On: 06/11/2021 09:02   PERIPHERAL VASCULAR CATHETERIZATION  Result Date: 06/12/2021 See surgical note for result.  VAS US DUPLEX DIALYSIS ACCESS (AVF, AVG)  Result Date: 06/04/2021 DIALYSIS ACCESS Patient Name:  Terry Arroyo  Date of Exam:   06/04/2021 Medical Rec #: [id number]        Accession #:    [id number] Date of Birth: [birth year]         Patient Gender: F Patient Age:    68Y Exam [facility] Procedure:      VAS US DUPLEX DIALYSIS ACCESS (AVF, AVG) Referring Phys: [id number] [place] --------------------------------------------------------------------------------  History: Created 01/28/2021          5/25 Thrombectomy of avg. Performing Technologist: Concha Norway RVT  Examination Guidelines: A complete evaluation includes B-mode imaging, spectral Doppler, color Doppler, and power Doppler as needed of all accessible portions of each vessel. Unilateral testing is considered an integral part of a complete examination. Limited examinations for reoccurring indications may be performed as noted.  Findings:   +--------------------+----------+-----------------+--------+ AVG                 PSV (cm/s)Flow Vol (mL/min)Describe +--------------------+----------+-----------------+--------+ Native artery inflow   267          2473                +--------------------+----------+-----------------+--------+ Arterial anastomosis   437                              +--------------------+----------+-----------------+--------+ Prox graft             201                              +--------------------+----------+-----------------+--------+ Mid graft              144                              +--------------------+----------+-----------------+--------+ Distal graft           155                              +--------------------+----------+-----------------+--------+  Venous anastomosis     182                              +--------------------+----------+-----------------+--------+ Venous outflow         102                              +--------------------+----------+-----------------+--------+ +--------------+------------+----------+---------+---------+-------------------+                 Diameter  Depth (cm)Branching   PSV       Flow Volume                       (cm)                        (cm/s)       (ml/min)        +--------------+------------+----------+---------+---------+-------------------+ Rt Rad Art Dis                                  40                        +--------------+------------+----------+---------+---------+-------------------+ Antegrade                                                                 +--------------+------------+----------+---------+---------+-------------------+  Summary: Widely patent right BrachAx AVG s/p thrombectomy.  *See table(s) above for measurements and observations.  Diagnosing physician: [name] MD Electronically signed by [name] MD on 06/04/2021 at 4:41:00 PM.   --------------------------------------------------------------------------------   Final    ECHOCARDIOGRAM COMPLETE  Result Date: 06/13/2021    ECHOCARDIOGRAM REPORT   Patient Name:   Terry Arroyo Date of Exam: 06/13/2021 Medical Rec #:  [id number]       Height:       66.0 in Accession #:    [id number]      Weight:       170.0 lb Date of Birth:  29-Apr-1961        BSA:          1.866 m Patient Age:    39 years        BP:           130/60 mmHg Patient Gender: F               HR:           64 bpm. Exam Location:  [facility] Procedure: 2D Echo, Color Doppler and Cardiac Doppler Indications:     Dyspnea R06.00  History:         Patient has prior history of Echocardiogram examinations. CAD,                  Stroke; Signs/Symptoms:Dyspnea.  Sonographer:     Alyse Low Roar Referring Phys:  [place] Diagnosing Phys: Kate Sable MD IMPRESSIONS  1. Left ventricular ejection fraction, by estimation, is 35 to 40%. The left ventricle has moderately decreased function. The left ventricle demonstrates regional wall motion abnormalities (see scoring diagram/findings for description). There is mild  left ventricular hypertrophy. Left ventricular diastolic parameters are consistent with Grade II diastolic dysfunction (pseudonormalization). There is severe hypokinesis of the left ventricular,  entire inferolateral wall.  2. Right ventricular systolic function is mildly reduced. The right ventricular size is normal.  3. Left atrial size was mild to moderately dilated.  4. Right atrial size was mildly dilated.  5. The mitral valve is degenerative. Mild mitral valve regurgitation.  6. Tricuspid valve regurgitation is mild to moderate.  7. The aortic valve is tricuspid. Aortic valve regurgitation is not visualized. FINDINGS  Left Ventricle: Left ventricular ejection fraction, by estimation, is 35 to 40%. The left ventricle has moderately decreased function. The left ventricle demonstrates regional wall motion abnormalities. Severe hypokinesis of the left ventricular, entire  inferolateral wall. The left ventricular internal cavity size was normal in size. There is mild left ventricular hypertrophy. Left ventricular diastolic parameters are consistent with Grade II diastolic dysfunction (pseudonormalization). Right Ventricle: The right ventricular size is normal. No increase in right ventricular wall thickness. Right ventricular systolic function is mildly reduced. Left Atrium: Left atrial size was mild to moderately dilated. Right Atrium: Right atrial size was mildly dilated. Pericardium: There is no evidence of pericardial effusion. Mitral Valve: The mitral valve is degenerative in appearance. There is mild thickening of the mitral valve leaflet(s). There is moderate calcification of the mitral valve leaflet(s). Mild mitral valve regurgitation. Tricuspid Valve: The tricuspid valve is normal in structure. Tricuspid valve regurgitation is mild to moderate. Aortic Valve: The aortic valve is tricuspid. Aortic valve regurgitation is not visualized. Aortic valve peak gradient measures 9.2 mmHg. Pulmonic Valve: The pulmonic valve was normal in structure. Pulmonic valve regurgitation is mild. Aorta: The aortic root is normal in size and structure. Venous: The inferior vena cava was not well visualized. IAS/Shunts:  No atrial level shunt detected by color flow Doppler.  LEFT VENTRICLE PLAX 2D LVIDd:         5.00 cm      Diastology LVIDs:         4.20 cm      LV e' medial:    4.90 cm/s LV PW:         1.20 cm      LV E/e' medial:  28.6 LV IVS:        1.20 cm      LV e' lateral:   4.35 cm/s LVOT diam:     1.70 cm      LV E/e' lateral: 32.2 LVOT Area:     2.27 cm  LV Volumes (MOD) LV vol d, MOD A2C: 110.0 ml LV vol d, MOD A4C: 115.0 ml LV vol s, MOD A2C: 71.2 ml LV vol s, MOD A4C: 74.2 ml LV SV MOD A2C:     38.8 ml LV SV MOD A4C:     115.0 ml LV SV MOD BP:      40.3 ml RIGHT VENTRICLE RV Mid diam:    3.00 cm RV S prime:     8.38 cm/s TAPSE (M-mode): 1.8 cm LEFT ATRIUM             Index       RIGHT ATRIUM           Index LA diam:        3.80 cm 2.04 cm/m  RA Area:     21.20 cm LA Vol (A2C):   73.4 ml 39.33 ml/m RA Volume:   59.30 ml  31.77 ml/m LA Vol (A4C):  76.5 ml 40.99 ml/m LA Biplane Vol: 75.3 ml 40.35 ml/m  AORTIC VALVE                PULMONIC VALVE AV Area (Vmax): 1.28 cm    PV Vmax:        1.10 m/s AV Vmax:        152.00 cm/s PV Peak grad:   4.8 mmHg AV Peak Grad:   9.2 mmHg    RVOT Peak grad: 3 mmHg LVOT Vmax:      85.70 cm/s  AORTA Ao Root diam: 2.90 cm MITRAL VALVE                TRICUSPID VALVE MV Area (PHT): 3.89 cm     TR Peak grad:   32.7 mmHg MV Decel Time: 195 msec     TR Vmax:        286.00 cm/s MV E velocity: 140.00 cm/s MV A velocity: 56.90 cm/s   SHUNTS MV E/A ratio:  2.46         Systemic Diam: 1.70 cm MV A Prime:    7.5 cm/s Kate Sable MD Electronically signed by Kate Sable MD Signature Date/Time: 06/13/2021/5:56:08 PM    Final     Microbiology: Recent Results (from the past 240 hour(s))  Resp Panel by RT-PCR (Flu A&B, Covid) Nasopharyngeal Swab     Status: None   Collection Time: 06/11/21 10:28 AM   Specimen: Nasopharyngeal Swab; Nasopharyngeal(NP) swabs in vial transport medium  Result Value Ref Range Status   SARS Coronavirus 2 by RT PCR NEGATIVE NEGATIVE Final    Comment:  (NOTE) SARS-CoV-2 target nucleic acids are NOT DETECTED.  The SARS-CoV-2 RNA is generally detectable in upper respiratory specimens during the acute phase of infection. The lowest concentration of SARS-CoV-2 viral copies this assay can detect is 138 copies/mL. A negative result does not preclude SARS-Cov-2 infection and should not be used as the sole basis for treatment or other patient management decisions. A negative result may occur with  improper specimen collection/handling, submission of specimen other than nasopharyngeal swab, presence of viral mutation(s) within the areas targeted by this assay, and inadequate number of viral copies(<138 copies/mL). A negative result must be combined with clinical observations, patient history, and epidemiological information. The expected result is Negative.  Fact Sheet for Patients:  EntrepreneurPulse.com.au  Fact Sheet for Healthcare Providers:  IncredibleEmployment.be  This test is no t yet approved or cleared by the [place] FDA and  has been authorized for detection and/or diagnosis of SARS-CoV-2 by FDA under an Emergency Use Authorization (EUA). This EUA will remain  in effect (meaning this test can be used) for the duration of the COVID-19 declaration under Section 564(b)(1) of the Act, 21 U.S.C.section 360bbb-3(b)(1), unless the authorization is terminated  or revoked sooner.       Influenza A by PCR NEGATIVE NEGATIVE Final   Influenza B by PCR NEGATIVE NEGATIVE Final    Comment: (NOTE) The Xpert Xpress SARS-CoV-2/FLU/RSV plus assay is intended as an aid in the diagnosis of influenza from Nasopharyngeal swab specimens and should not be used as a sole basis for treatment. Nasal washings and aspirates are unacceptable for Xpert Xpress SARS-CoV-2/FLU/RSV testing.  Fact Sheet for Patients: EntrepreneurPulse.com.au  Fact Sheet for Healthcare  Providers: IncredibleEmployment.be  This test is not yet approved or cleared by the [place] FDA and has been authorized for detection and/or diagnosis of SARS-CoV-2 by FDA under an Emergency Use Authorization (EUA). This EUA will remain  in effect (meaning this test can be used) for the duration of the COVID-19 declaration under Section 564(b)(1) of the Act, 21 U.S.C. section 360bbb-3(b)(1), unless the authorization is terminated or revoked.  Performed at [facility], [address] [place], [place] [address]      Labs: Basic Metabolic Panel: Recent Labs  Lab 06/11/21 [phone] 06/12/21 0644 06/13/21 0549 06/14/21 0607 06/15/21 0654  NA 135 136 134* 134* 135  K 2.9* 3.9 3.6 3.6 3.9  CL 95* 98 97* 96* 99  CO2 29 30 30 27 27   GLUCOSE 324* 318* 254* 267* 276*  BUN 17 18 23* 44* 59*  CREATININE 4.23* 3.94* 3.96* 5.37* 6.44*  CALCIUM 7.8* 8.1* 8.2* 8.2* 8.4*   Liver Function Tests: No results for input(s): AST, ALT, ALKPHOS, BILITOT, PROT, ALBUMIN in the last 168 hours. No results for input(s): LIPASE, AMYLASE in the last 168 hours. No results for input(s): AMMONIA in the last 168 hours. CBC: Recent Labs  Lab 06/11/21 0844 06/12/21 0644 06/13/21 0549 06/14/21 0607 06/15/21 0654  WBC 7.6 7.3 6.7 5.9 6.3  NEUTROABS  --   --  3.8 3.3 3.9  HGB 8.6* 8.2* 8.1* 8.1* 7.7*  HCT 26.4* 25.9* 25.4* 24.0* 23.6*  MCV 88.0 89.6 89.1 86.3 88.7  PLT 255 259 256 225 236   Cardiac Enzymes: No results for input(s): CKTOTAL, CKMB, CKMBINDEX, TROPONINI in the last 168 hours. BNP: BNP (last 3 results) Recent Labs    10/21/20 1931 11/01/20 1520 06/12/21 0644  BNP 1,384.4* 1,306.3* 3,501.7*    ProBNP (last 3 results) No results for input(s): PROBNP in the last 8760 hours.  CBG: Recent Labs  Lab 06/14/21 2139 06/15/21 0748 06/15/21 1135 06/15/21 1409 06/15/21 1624  GLUCAP 229* 296* 243* 112* 105*       Signed:  Alma Friendly,  MD [facility] 06/15/2021, 5:05 PM

## 2021-06-15 NOTE — TOC Progression Note (Signed)
Transition of Care [facility]) - Progression Note    Patient Details  Name: Terry Arroyo MRN: [id number] Date of Birth: 31-Oct-1961  Transition of Care [facility]) CM/SW [place], RN Phone Number: 06/15/2021, 4:03 PM  Clinical Narrative: Patient's family called to request that [facility] arrange for patient ride home. Patient is currently in HD, [employer] signed and given to unit [place].     Expected Discharge Plan: Home/Self Care Barriers to Discharge: Continued Medical Work up  Expected Discharge Plan and Services Expected Discharge Plan: Home/Self Care                                               Social Determinants of Health (SDOH) Interventions    Readmission Risk Interventions Readmission Risk Prevention Plan 11/03/2020 10/23/2020 10/12/2020  Transportation Screening Complete Complete Complete  Medication Review Press photographer) Complete Complete Complete  PCP or Specialist appointment within 3-5 days of discharge Not Complete Complete Complete  PCP/Specialist Appt Not Complete comments To be scheduled by unit secretary. - -  HRI or Home Care Consult Complete Complete -  SW Recovery Care/Counseling Consult Complete Complete Complete  Palliative Care Screening Not Applicable Not Applicable Not Applicable  Skilled Nursing Facility Patient Refused Not Applicable Not Complete  SNF Comments Patient refuses states she is able to take care of herself. - Patient pending evaluation  Some recent data might be hidden

## 2021-06-15 NOTE — Progress Notes (Signed)
PT Cancellation Note  Patient Details Name: LAKEENA DOWNIE MRN: [id number] DOB: Dec 18, 1961   Cancelled Treatment:    Reason Eval/Treat Not Completed: Other (comment).  Pt currently at dialysis.  Will re-attempt PT treatment session at a later date/time.  Leitha Bleak, PT 06/15/21, 2:08 PM

## 2021-06-15 NOTE — Progress Notes (Signed)
Pt completed full tx as ordered. Unable to remove any fluid due to hypotension. All blood returned.  SK

## 2021-06-16 MED FILL — Insulin Aspart Prot & Aspart (Human) Inj 100 Unit/ML (70-30): SUBCUTANEOUS | Qty: 0.15 | Status: AC

## 2021-06-17 DIAGNOSIS — Z992 Dependence on renal dialysis: Secondary | ICD-10-CM | POA: Diagnosis not present

## 2021-06-17 DIAGNOSIS — N186 End stage renal disease: Secondary | ICD-10-CM | POA: Diagnosis not present

## 2021-06-18 DIAGNOSIS — N186 End stage renal disease: Secondary | ICD-10-CM | POA: Diagnosis not present

## 2021-06-18 DIAGNOSIS — Z992 Dependence on renal dialysis: Secondary | ICD-10-CM | POA: Diagnosis not present

## 2021-06-19 DIAGNOSIS — Z992 Dependence on renal dialysis: Secondary | ICD-10-CM | POA: Diagnosis not present

## 2021-06-19 DIAGNOSIS — N186 End stage renal disease: Secondary | ICD-10-CM | POA: Diagnosis not present

## 2021-06-22 DIAGNOSIS — N186 End stage renal disease: Secondary | ICD-10-CM | POA: Diagnosis not present

## 2021-06-22 DIAGNOSIS — Z992 Dependence on renal dialysis: Secondary | ICD-10-CM | POA: Diagnosis not present

## 2021-06-24 DIAGNOSIS — Z992 Dependence on renal dialysis: Secondary | ICD-10-CM | POA: Diagnosis not present

## 2021-06-24 DIAGNOSIS — N186 End stage renal disease: Secondary | ICD-10-CM | POA: Diagnosis not present

## 2021-06-26 DIAGNOSIS — Z992 Dependence on renal dialysis: Secondary | ICD-10-CM | POA: Diagnosis not present

## 2021-06-26 DIAGNOSIS — N186 End stage renal disease: Secondary | ICD-10-CM | POA: Diagnosis not present

## 2021-06-29 DIAGNOSIS — Z992 Dependence on renal dialysis: Secondary | ICD-10-CM | POA: Diagnosis not present

## 2021-06-29 DIAGNOSIS — Z794 Long term (current) use of insulin: Secondary | ICD-10-CM | POA: Diagnosis not present

## 2021-06-29 DIAGNOSIS — I25118 Atherosclerotic heart disease of native coronary artery with other forms of angina pectoris: Secondary | ICD-10-CM | POA: Diagnosis not present

## 2021-06-29 DIAGNOSIS — E1122 Type 2 diabetes mellitus with diabetic chronic kidney disease: Secondary | ICD-10-CM | POA: Diagnosis not present

## 2021-06-29 DIAGNOSIS — N186 End stage renal disease: Secondary | ICD-10-CM | POA: Diagnosis not present

## 2021-06-30 DIAGNOSIS — N2581 Secondary hyperparathyroidism of renal origin: Secondary | ICD-10-CM | POA: Diagnosis not present

## 2021-06-30 DIAGNOSIS — Z23 Encounter for immunization: Secondary | ICD-10-CM | POA: Diagnosis not present

## 2021-06-30 DIAGNOSIS — T466X5A Adverse effect of antihyperlipidemic and antiarteriosclerotic drugs, initial encounter: Secondary | ICD-10-CM | POA: Diagnosis not present

## 2021-06-30 DIAGNOSIS — I25118 Atherosclerotic heart disease of native coronary artery with other forms of angina pectoris: Secondary | ICD-10-CM | POA: Diagnosis not present

## 2021-06-30 DIAGNOSIS — Z0000 Encounter for general adult medical examination without abnormal findings: Secondary | ICD-10-CM | POA: Diagnosis not present

## 2021-06-30 DIAGNOSIS — J449 Chronic obstructive pulmonary disease, unspecified: Secondary | ICD-10-CM | POA: Diagnosis not present

## 2021-06-30 DIAGNOSIS — M791 Myalgia, unspecified site: Secondary | ICD-10-CM | POA: Diagnosis not present

## 2021-06-30 DIAGNOSIS — E1122 Type 2 diabetes mellitus with diabetic chronic kidney disease: Secondary | ICD-10-CM | POA: Diagnosis not present

## 2021-06-30 DIAGNOSIS — N186 End stage renal disease: Secondary | ICD-10-CM | POA: Diagnosis not present

## 2021-07-01 DIAGNOSIS — N186 End stage renal disease: Secondary | ICD-10-CM | POA: Diagnosis not present

## 2021-07-01 DIAGNOSIS — Z992 Dependence on renal dialysis: Secondary | ICD-10-CM | POA: Diagnosis not present

## 2021-07-03 DIAGNOSIS — N186 End stage renal disease: Secondary | ICD-10-CM | POA: Diagnosis not present

## 2021-07-03 DIAGNOSIS — Z992 Dependence on renal dialysis: Secondary | ICD-10-CM | POA: Diagnosis not present

## 2021-07-06 DIAGNOSIS — Z992 Dependence on renal dialysis: Secondary | ICD-10-CM | POA: Diagnosis not present

## 2021-07-06 DIAGNOSIS — N186 End stage renal disease: Secondary | ICD-10-CM | POA: Diagnosis not present

## 2021-07-08 DIAGNOSIS — Z992 Dependence on renal dialysis: Secondary | ICD-10-CM | POA: Diagnosis not present

## 2021-07-08 DIAGNOSIS — N186 End stage renal disease: Secondary | ICD-10-CM | POA: Diagnosis not present

## 2021-07-10 DIAGNOSIS — Z992 Dependence on renal dialysis: Secondary | ICD-10-CM | POA: Diagnosis not present

## 2021-07-10 DIAGNOSIS — N186 End stage renal disease: Secondary | ICD-10-CM | POA: Diagnosis not present

## 2021-07-13 DIAGNOSIS — N186 End stage renal disease: Secondary | ICD-10-CM | POA: Diagnosis not present

## 2021-07-13 DIAGNOSIS — Z992 Dependence on renal dialysis: Secondary | ICD-10-CM | POA: Diagnosis not present

## 2021-07-14 DIAGNOSIS — Z951 Presence of aortocoronary bypass graft: Secondary | ICD-10-CM | POA: Diagnosis not present

## 2021-07-14 DIAGNOSIS — R609 Edema, unspecified: Secondary | ICD-10-CM | POA: Diagnosis not present

## 2021-07-14 DIAGNOSIS — I10 Essential (primary) hypertension: Secondary | ICD-10-CM | POA: Diagnosis not present

## 2021-07-14 DIAGNOSIS — I2510 Atherosclerotic heart disease of native coronary artery without angina pectoris: Secondary | ICD-10-CM | POA: Diagnosis not present

## 2021-07-14 DIAGNOSIS — I25118 Atherosclerotic heart disease of native coronary artery with other forms of angina pectoris: Secondary | ICD-10-CM | POA: Diagnosis not present

## 2021-07-14 DIAGNOSIS — I208 Other forms of angina pectoris: Secondary | ICD-10-CM | POA: Diagnosis not present

## 2021-07-14 DIAGNOSIS — R0602 Shortness of breath: Secondary | ICD-10-CM | POA: Diagnosis not present

## 2021-07-14 DIAGNOSIS — R0609 Other forms of dyspnea: Secondary | ICD-10-CM | POA: Diagnosis not present

## 2021-07-14 DIAGNOSIS — I700 Atherosclerosis of aorta: Secondary | ICD-10-CM | POA: Diagnosis not present

## 2021-07-15 DIAGNOSIS — Z992 Dependence on renal dialysis: Secondary | ICD-10-CM | POA: Diagnosis not present

## 2021-07-15 DIAGNOSIS — Z794 Long term (current) use of insulin: Secondary | ICD-10-CM | POA: Diagnosis not present

## 2021-07-15 DIAGNOSIS — E119 Type 2 diabetes mellitus without complications: Secondary | ICD-10-CM | POA: Diagnosis not present

## 2021-07-15 DIAGNOSIS — N186 End stage renal disease: Secondary | ICD-10-CM | POA: Diagnosis not present

## 2021-07-16 ENCOUNTER — Other Ambulatory Visit (HOSPITAL_COMMUNITY): Payer: Self-pay

## 2021-07-16 ENCOUNTER — Ambulatory Visit: Payer: Medicare HMO | Admitting: Family

## 2021-07-16 NOTE — Progress Notes (Signed)
Had a phone visit with Terry Arroyo.  She states doing better.  She denies any fluid problems.  Denies any chest pain or shortness of breath.  She states got her shunt out of her chest.  They using her graft now.  She is upset the way her arm looks.  Her appetite is good, she states eats small meal at a time.  She states she is out of some her medications.  Will visit with her on Tuesday to see about her meds.  She has dialysis 3 days a week and hard to visit with her between appts and dialysis.  She is busy today and tomorrow.  She states she has everything for daily living.  She appears to be in a good mood.  Will visit for heart failure and medication compliance.   [place] [phone]

## 2021-07-17 DIAGNOSIS — N186 End stage renal disease: Secondary | ICD-10-CM | POA: Diagnosis not present

## 2021-07-17 DIAGNOSIS — Z992 Dependence on renal dialysis: Secondary | ICD-10-CM | POA: Diagnosis not present

## 2021-07-19 DIAGNOSIS — N186 End stage renal disease: Secondary | ICD-10-CM | POA: Diagnosis not present

## 2021-07-19 DIAGNOSIS — Z992 Dependence on renal dialysis: Secondary | ICD-10-CM | POA: Diagnosis not present

## 2021-07-20 DIAGNOSIS — Z992 Dependence on renal dialysis: Secondary | ICD-10-CM | POA: Diagnosis not present

## 2021-07-20 DIAGNOSIS — N186 End stage renal disease: Secondary | ICD-10-CM | POA: Diagnosis not present

## 2021-07-21 ENCOUNTER — Encounter (HOSPITAL_COMMUNITY): Payer: Self-pay

## 2021-07-21 ENCOUNTER — Other Ambulatory Visit (HOSPITAL_COMMUNITY): Payer: Self-pay

## 2021-07-21 NOTE — Progress Notes (Signed)
Today had a home visit with Terry Arroyo.  She states doing good.  Mood is good.  She states she has had a lot of things happen in past few years but she is doing good, she states could be worse.  She is out of 3 medications, her omeprazole, Remeron, and miralax.  Contacted [facility] and they are placing an order for omeprazole and remeron.  She will have to get miralax over the counter.  She states she has heart burn a lot and hard to eat sometime.  Did advised she could buy omeprazole over the counter until she get her prescription.  That it may help, she states been out for a few weeks.  She states will get it tomorrow.  She states dialysis is going good, her BP has improved.  She stats can not eat large meals, she snacks a lot throughout the day.  She states she has everything for daily living.  She Has good friends and family.  She is loving being on her own with her new apartment.  She is aware of up coming appts.  Will continue to visit for heart failure, diet and medication management.   [place] [phone]

## 2021-07-22 DIAGNOSIS — Z992 Dependence on renal dialysis: Secondary | ICD-10-CM | POA: Diagnosis not present

## 2021-07-22 DIAGNOSIS — N186 End stage renal disease: Secondary | ICD-10-CM | POA: Diagnosis not present

## 2021-07-24 DIAGNOSIS — N186 End stage renal disease: Secondary | ICD-10-CM | POA: Diagnosis not present

## 2021-07-24 DIAGNOSIS — Z992 Dependence on renal dialysis: Secondary | ICD-10-CM | POA: Diagnosis not present

## 2021-07-27 DIAGNOSIS — Z992 Dependence on renal dialysis: Secondary | ICD-10-CM | POA: Diagnosis not present

## 2021-07-27 DIAGNOSIS — N186 End stage renal disease: Secondary | ICD-10-CM | POA: Diagnosis not present

## 2021-07-29 DIAGNOSIS — N186 End stage renal disease: Secondary | ICD-10-CM | POA: Diagnosis not present

## 2021-07-29 DIAGNOSIS — Z992 Dependence on renal dialysis: Secondary | ICD-10-CM | POA: Diagnosis not present

## 2021-07-30 ENCOUNTER — Other Ambulatory Visit: Payer: Self-pay

## 2021-07-30 ENCOUNTER — Encounter: Payer: Self-pay | Admitting: Family

## 2021-07-30 ENCOUNTER — Ambulatory Visit: Payer: Medicare HMO | Attending: Family | Admitting: Family

## 2021-07-30 VITALS — BP 155/73 | HR 72 | Resp 20 | Ht 66.0 in | Wt 172.5 lb

## 2021-07-30 DIAGNOSIS — T783XXA Angioneurotic edema, initial encounter: Secondary | ICD-10-CM | POA: Insufficient documentation

## 2021-07-30 DIAGNOSIS — Z87891 Personal history of nicotine dependence: Secondary | ICD-10-CM | POA: Insufficient documentation

## 2021-07-30 DIAGNOSIS — R42 Dizziness and giddiness: Secondary | ICD-10-CM | POA: Insufficient documentation

## 2021-07-30 DIAGNOSIS — Z992 Dependence on renal dialysis: Secondary | ICD-10-CM | POA: Diagnosis not present

## 2021-07-30 DIAGNOSIS — Z8616 Personal history of COVID-19: Secondary | ICD-10-CM | POA: Insufficient documentation

## 2021-07-30 DIAGNOSIS — Z951 Presence of aortocoronary bypass graft: Secondary | ICD-10-CM | POA: Diagnosis not present

## 2021-07-30 DIAGNOSIS — R112 Nausea with vomiting, unspecified: Secondary | ICD-10-CM | POA: Diagnosis not present

## 2021-07-30 DIAGNOSIS — R059 Cough, unspecified: Secondary | ICD-10-CM | POA: Insufficient documentation

## 2021-07-30 DIAGNOSIS — I890 Lymphedema, not elsewhere classified: Secondary | ICD-10-CM

## 2021-07-30 DIAGNOSIS — I5022 Chronic systolic (congestive) heart failure: Secondary | ICD-10-CM | POA: Diagnosis not present

## 2021-07-30 DIAGNOSIS — R519 Headache, unspecified: Secondary | ICD-10-CM | POA: Insufficient documentation

## 2021-07-30 DIAGNOSIS — M549 Dorsalgia, unspecified: Secondary | ICD-10-CM | POA: Insufficient documentation

## 2021-07-30 DIAGNOSIS — E1136 Type 2 diabetes mellitus with diabetic cataract: Secondary | ICD-10-CM | POA: Insufficient documentation

## 2021-07-30 DIAGNOSIS — Z8249 Family history of ischemic heart disease and other diseases of the circulatory system: Secondary | ICD-10-CM | POA: Insufficient documentation

## 2021-07-30 DIAGNOSIS — R531 Weakness: Secondary | ICD-10-CM | POA: Insufficient documentation

## 2021-07-30 DIAGNOSIS — N186 End stage renal disease: Secondary | ICD-10-CM | POA: Diagnosis not present

## 2021-07-30 DIAGNOSIS — R0789 Other chest pain: Secondary | ICD-10-CM | POA: Insufficient documentation

## 2021-07-30 DIAGNOSIS — M7989 Other specified soft tissue disorders: Secondary | ICD-10-CM | POA: Insufficient documentation

## 2021-07-30 DIAGNOSIS — Z794 Long term (current) use of insulin: Secondary | ICD-10-CM

## 2021-07-30 DIAGNOSIS — E1122 Type 2 diabetes mellitus with diabetic chronic kidney disease: Secondary | ICD-10-CM | POA: Diagnosis not present

## 2021-07-30 DIAGNOSIS — Z888 Allergy status to other drugs, medicaments and biological substances status: Secondary | ICD-10-CM | POA: Insufficient documentation

## 2021-07-30 DIAGNOSIS — G8929 Other chronic pain: Secondary | ICD-10-CM | POA: Insufficient documentation

## 2021-07-30 DIAGNOSIS — I132 Hypertensive heart and chronic kidney disease with heart failure and with stage 5 chronic kidney disease, or end stage renal disease: Secondary | ICD-10-CM | POA: Insufficient documentation

## 2021-07-30 DIAGNOSIS — I10 Essential (primary) hypertension: Secondary | ICD-10-CM

## 2021-07-30 NOTE — Patient Instructions (Signed)
Continue weighing daily and call for an overnight weight gain of > 2 pounds or a weekly weight gain of >5 pounds. 

## 2021-07-30 NOTE — Progress Notes (Signed)
Patient ID: Terry Arroyo, female    DOB: [birth year], 60 y.o.   MRN: [id number]  HPI  Terry Arroyo is a 60 y/o female with a history of DM, hyperlipidemia, HTN, brain tumor, left eye blindness, previous tobacco use and chronic heart failure.   Echo report from 06/13/21 reviewed and showed an EF of 35-40% along with mild MR and mild/moderate TR. Echo report from 02/27/20 reviewed and showed an EF of 30-35% along with mild MR.   Admitted 06/11/21 due to acute pulmonary edema. Cardiology and vascular consults obtained. Treated with dialysis. Discharged after 4 days.   She presents today for a follow-up visit although hasn't been seen since April 2021. She presents with a chief complaint of moderate fatigue with little exertion. She describes this as chronic in nature having been present for several years. She has associated cough, shortness of breath, intermittent chest pain, pedal edema, headaches, light-headedness, weakness, chronic back pain and n/v along with this. She denies any difficulty sleeping, abdominal distention, palpitations or weight gain.   Currently receiving dialysis on M, W, F and says that she's doing ok with that. Has recently started using compression boots that she bought off of [employer] and she says that helps with the swelling in her legs.   Past Medical History:  Diagnosis Date   Anemia    Angina at rest Terry Arroyo)    Angioedema 03/20/2021   Lisinopril   Anterior cerebral aneurysm    approximately 5 mm; left MCA    Anxiety    Aortic atherosclerosis (HCC)    Asthma    Blind left eye    COPD (chronic obstructive pulmonary disease) (HCC)    Coronary artery disease    Depression    DOE (dyspnea on exertion)    ESRD (end stage renal disease) (HCC)    Gait instability    GERD (gastroesophageal reflux disease)    HFrEF (heart failure with reduced ejection fraction) (Terry Arroyo)    History of 2019 novel coronavirus disease (COVID-19) 03/2019   Terry Arroyo    History of DVT (deep vein thrombosis)    Hx of CABG 2018   x 4; performed in [place]   Hyperlipidemia    Hypertension    MI (myocardial infarction) (Early) 2018   no stents   Pituitary adenoma (Terry Arroyo)    Secondary hyperparathyroidism (Terry Arroyo)    Sleep apnea    non-compliant with nocturnal PAP therapy   Stroke Terry Arroyo) 2014   stated affected left side.   T2DM (type 2 diabetes mellitus) (Terry Arroyo)    Past Surgical History:  Procedure Laterality Date   AV FISTULA PLACEMENT Right 01/28/2021   Procedure: ARTERIOVENOUS (AV) FISTULA CREATION;  Surgeon: Katha Cabal, MD;  Location: [facility] ORS;  Service: Vascular;  Laterality: Right;   AV FISTULA PLACEMENT Right 05/13/2021   Procedure: INSERTION OF ARTERIOVENOUS (AV) GORE-TEX GRAFT ARM ( BRACHIAL AXILLARY );  Surgeon: Katha Cabal, MD;  Location: [facility] ORS;  Service: Vascular;  Laterality: Right;   [place]   pituitary tumor   BREAST EXCISIONAL BIOPSY Left yrs ago   benign   CESAREAN SECTION     CORONARY ARTERY BYPASS GRAFT  2018   x 4 vessels; performed in [place] Ar[place] N/A 11/07/2020   Procedure: DIALYSIS/PERMA CATHETER INSERTION;  Surgeon: Algernon Huxley, MD;  Location: Terry Arroyo;  Service: Cardiovascular;  Laterality: N/A;   DIALYSIS/PERMA CATHETER REMOVAL N/A 06/12/2021  Procedure: DIALYSIS/PERMA CATHETER REMOVAL;  Surgeon: Algernon Huxley, MD;  Location: Terry Arroyo;  Service: Cardiovascular;  Laterality: N/A;   EYE SURGERY Right 2021   cataracts    PERIPHERAL VASCULAR THROMBECTOMY Right 04/03/2021   Procedure: A/V Fistulagram ;  Surgeon: Katha Cabal, MD;  Location: Terry Arroyo;  Service: Cardiovascular;  Laterality: Right;   Family History  Problem Relation Age of Onset   CAD Mother    CAD Brother    Breast cancer Sister 44   Social History   Tobacco Use   Smoking status: Former    Types: Cigarettes    Quit date: 2018    Years since quitting: 4.6    Smokeless tobacco: Never  Substance Use Topics   Alcohol use: Not Currently   Allergies  Allergen Reactions   Hydralazine Hives, Shortness Of Breath, Swelling and Rash    Body aches    Kiwi Extract Hives, Swelling and Rash    Mouth swells    Lisinopril Swelling    Angioedema   Shellfish Allergy Anaphylaxis    Shrimp/lobster  Betadine leaves welts on skin     Betadine [Povidone Iodine] Hives and Rash    LEAVES WELTS ON SKIN   Metformin Diarrhea and Other (See Comments)    GI Intolerance   Tape Itching    Use paper tape whenever possible   Prior to Admission medications   Medication Sig Start Date End Date Taking? Authorizing Provider  acetaminophen (TYLENOL) 500 MG tablet Take 500-1,000 mg by mouth every 6 (six) hours as needed for mild pain or moderate pain.   Yes [provider]  albuterol (VENTOLIN HFA) 108 (90 Base) MCG/ACT inhaler Inhale 2 puffs into the lungs every 6 (six) hours as needed for wheezing or shortness of breath.   Yes [provider]  amLODipine (NORVASC) 10 MG tablet Take 1 tablet (10 mg total) by mouth daily. 03/03/20  Yes Lorella Nimrod, MD  ammonium lactate (LAC-HYDRIN) 12 % lotion Apply 1 application topically daily.   Yes [provider]  aspirin 81 MG EC tablet Take 1 tablet (81 mg total) by mouth daily. 02/10/21  Yes Max Sane, MD  atorvastatin (LIPITOR) 80 MG tablet Take 1 tablet (80 mg total) by mouth daily at 6 PM. 11/26/19  Yes Lavina Hamman, MD  bismuth subsalicylate (PEPTO BISMOL) 262 MG/15ML suspension Take 30 mLs by mouth every 6 (six) hours as needed for indigestion or diarrhea or loose stools.   Yes [provider]  carvedilol (COREG) 25 MG tablet Take 25 mg by mouth 2 (two) times daily.   Yes [provider]  cloNIDine (CATAPRES) 0.1 MG tablet Take 0.1 mg by mouth 2 (two) times daily. (Non-dialysis days)   Yes [provider]  fenofibrate (TRICOR) 145 MG tablet Take 145 mg by mouth daily.    Yes [provider]  furosemide (LASIX) 40 MG tablet Take 40 mg by mouth daily.   Yes [provider]  insulin aspart protamine- aspart (NOVOLOG MIX 70/30) (70-30) 100 UNIT/ML injection Inject 0.15 mLs (15 Units total) into the skin 2 (two) times daily with a meal. 11/12/20  Yes Fritzi Mandes, MD  isosorbide mononitrate (IMDUR) 30 MG 24 hr tablet Take 30 mg by mouth daily.   Yes [provider]  loperamide (IMODIUM A-D) 2 MG tablet Take 4 mg by mouth 4 (four) times daily as needed for diarrhea or loose stools.   Yes [provider]  mirtazapine (  REMERON) 30 MG tablet Take 1 tablet (30 mg total) by mouth at bedtime. 11/26/19  Yes Lavina Hamman, MD  mometasone-formoterol [facility]) 200-5 MCG/ACT AERO Inhale 2 puffs into the lungs 2 (two) times daily.   Yes [provider]  omeprazole (PRILOSEC) 40 MG capsule Take 40 mg by mouth daily. 09/22/20  Yes [provider]  polyethylene glycol (MIRALAX / GLYCOLAX) 17 g packet Take 17 g by mouth daily. 02/11/21  Yes Lorella Nimrod, MD  potassium chloride (KLOR-CON) 10 MEQ tablet Take 10 mEq by mouth daily.   Yes [provider]  spironolactone (ALDACTONE) 25 MG tablet Take 1 tablet (25 mg total) by mouth daily. 06/16/21  Yes Alma Friendly, MD  topiramate (TOPAMAX) 25 MG tablet Take 25 mg by mouth daily. 08/19/20  Yes [provider]  traZODone (DESYREL) 50 MG tablet Take 50 mg by mouth at bedtime.   Yes [provider]  vitamin B-12 (CYANOCOBALAMIN) 1000 MCG tablet Take 1,000 mcg by mouth daily.   Yes [provider]    Review of Systems  Constitutional:  Positive for fatigue (easily). Negative for appetite change.  HENT:  Negative for congestion, postnasal drip and sore throat.   Eyes:  Positive for visual disturbance (blind in left eye). Negative for pain.  Respiratory:  Positive for cough and shortness of breath (easily).   Cardiovascular:  Positive for chest pain  (at times; described as "stinging") and leg swelling. Negative for palpitations.  Gastrointestinal:  Positive for nausea and vomiting. Negative for abdominal distention and abdominal pain.  Endocrine: Negative.   Genitourinary: Negative.   Musculoskeletal:  Positive for back pain. Negative for neck pain.  Skin: Negative.   Allergic/Immunologic: Negative.   Neurological:  Positive for weakness, light-headedness and headaches. Negative for dizziness.  Hematological:  Negative for adenopathy. Does not bruise/bleed easily.  Psychiatric/Behavioral:  Negative for dysphoric mood and sleep disturbance (sleeping on 2 pillows). The patient is not nervous/anxious.    Vitals:   07/30/21 0955  BP: (!) 155/73  Pulse: 72  Resp: 20  SpO2: 99%  Weight: 172 lb 8 oz (78.2 kg)  Height: 5\' 6"  (1.676 m)   Wt Readings from Last 3 Encounters:  07/30/21 172 lb 8 oz (78.2 kg)  07/21/21 170 lb (77.1 kg)  06/11/21 170 lb (77.1 kg)   Arroyo Results  Component Value Date   CREATININE 6.44 (H) 06/15/2021   CREATININE 5.37 (H) 06/14/2021   CREATININE 3.96 (H) 06/13/2021    Physical Exam Vitals and nursing note reviewed.  Constitutional:      Appearance: Normal appearance.  HENT:     Head: Normocephalic and atraumatic.  Cardiovascular:     Rate and Rhythm: Normal rate and regular rhythm.  Pulmonary:     Effort: Pulmonary effort is normal. No respiratory distress.     Breath sounds: No wheezing or rales.  Abdominal:     General: There is no distension.     Palpations: Abdomen is soft.  Musculoskeletal:        General: No tenderness.     Cervical back: Normal range of motion and neck supple.     Right lower leg: Edema (1+ pitting) present.     Left lower leg: Edema (1+ pitting) present.  Skin:    General: Skin is warm and Terry.  Neurological:     Mental Status: She is alert and oriented to person, place, and time. Mental status is at baseline.  Psychiatric:  Mood and Affect: Affect normal.         Behavior: Behavior normal.        Thought Content: Thought content normal.   Assessment & Plan:  1: Chronic heart failure with reduced ejection fraction- - NYHA class III - euvolemic today - weighing at dialysis as she says that her weight fluctuates in between sessions - not adding salt and has been trying to look at food labels; reviewed the importance of closely following a 2000mg  sodium diet and a low sodium cookbook was given to her today - saw cardiology Clayborn Bigness) 07/14/21 - on GDMT of carvedilol & spironolactone - renal function limits entresto/ SGLT2 use & lisinopril caused angioedema - participating in paramedicine program - BNP 06/12/21 was 3201.7  2: HTN- - BP mildly elevated (155/73) - saw PCP Ouida Sills) 06/30/21  3: DM- - saw endocrinology Pasty Arch) 06/19/20 - A1c 06/29/21 was 8.4% - glucose at home this morning was 137  4: ESRD- - BMP from 06/29/21 reviewed and showed sodium 140, potassium 4.2, creatinine 6.4 and GFR 8 - saw nephrology (Kolluru) 06/18/21 - dialysis M, W, F  5: Lymphedema- - stage 2 - wearing compression socks daily and says that her swelling has improved - elevate legs when sitting for long periods of time - limited in her ability to exercise due to her symptoms - wearing compression boots that she ordered off of [employer] and she says that her swelling improves after using them; she wears them once a day   Medication bottles were reviewed.  Return in 4 months or sooner for any questions/ problems before then.

## 2021-07-31 DIAGNOSIS — N186 End stage renal disease: Secondary | ICD-10-CM | POA: Diagnosis not present

## 2021-07-31 DIAGNOSIS — Z992 Dependence on renal dialysis: Secondary | ICD-10-CM | POA: Diagnosis not present

## 2021-08-03 DIAGNOSIS — Z992 Dependence on renal dialysis: Secondary | ICD-10-CM | POA: Diagnosis not present

## 2021-08-03 DIAGNOSIS — N186 End stage renal disease: Secondary | ICD-10-CM | POA: Diagnosis not present

## 2021-08-05 DIAGNOSIS — Z992 Dependence on renal dialysis: Secondary | ICD-10-CM | POA: Diagnosis not present

## 2021-08-05 DIAGNOSIS — N186 End stage renal disease: Secondary | ICD-10-CM | POA: Diagnosis not present

## 2021-08-07 ENCOUNTER — Other Ambulatory Visit: Payer: Self-pay | Admitting: Internal Medicine

## 2021-08-07 DIAGNOSIS — N186 End stage renal disease: Secondary | ICD-10-CM | POA: Diagnosis not present

## 2021-08-07 DIAGNOSIS — Z992 Dependence on renal dialysis: Secondary | ICD-10-CM | POA: Diagnosis not present

## 2021-08-07 DIAGNOSIS — Z1231 Encounter for screening mammogram for malignant neoplasm of breast: Secondary | ICD-10-CM

## 2021-08-10 DIAGNOSIS — N186 End stage renal disease: Secondary | ICD-10-CM | POA: Diagnosis not present

## 2021-08-10 DIAGNOSIS — Z992 Dependence on renal dialysis: Secondary | ICD-10-CM | POA: Diagnosis not present

## 2021-08-12 DIAGNOSIS — Z992 Dependence on renal dialysis: Secondary | ICD-10-CM | POA: Diagnosis not present

## 2021-08-12 DIAGNOSIS — N186 End stage renal disease: Secondary | ICD-10-CM | POA: Diagnosis not present

## 2021-08-14 DIAGNOSIS — Z992 Dependence on renal dialysis: Secondary | ICD-10-CM | POA: Diagnosis not present

## 2021-08-14 DIAGNOSIS — N186 End stage renal disease: Secondary | ICD-10-CM | POA: Diagnosis not present

## 2021-08-17 DIAGNOSIS — Z992 Dependence on renal dialysis: Secondary | ICD-10-CM | POA: Diagnosis not present

## 2021-08-17 DIAGNOSIS — N186 End stage renal disease: Secondary | ICD-10-CM | POA: Diagnosis not present

## 2021-08-19 DIAGNOSIS — Z992 Dependence on renal dialysis: Secondary | ICD-10-CM | POA: Diagnosis not present

## 2021-08-19 DIAGNOSIS — N186 End stage renal disease: Secondary | ICD-10-CM | POA: Diagnosis not present

## 2021-08-21 DIAGNOSIS — Z992 Dependence on renal dialysis: Secondary | ICD-10-CM | POA: Diagnosis not present

## 2021-08-21 DIAGNOSIS — N186 End stage renal disease: Secondary | ICD-10-CM | POA: Diagnosis not present

## 2021-08-24 DIAGNOSIS — Z992 Dependence on renal dialysis: Secondary | ICD-10-CM | POA: Diagnosis not present

## 2021-08-24 DIAGNOSIS — N186 End stage renal disease: Secondary | ICD-10-CM | POA: Diagnosis not present

## 2021-08-25 ENCOUNTER — Other Ambulatory Visit: Payer: Self-pay

## 2021-08-25 ENCOUNTER — Ambulatory Visit
Admission: RE | Admit: 2021-08-25 | Discharge: 2021-08-25 | Disposition: A | Discharge status: Home / Self Care | Payer: Medicare HMO | Source: Ambulatory Visit | Attending: Internal Medicine | Admitting: Internal Medicine

## 2021-08-25 DIAGNOSIS — Z1231 Encounter for screening mammogram for malignant neoplasm of breast: Secondary | ICD-10-CM

## 2021-08-25 IMAGING — MG MM DIGITAL SCREENING BILAT W/ TOMO AND CAD
6 of 10 series · 6 of 30 positions shown · non-contrast
Comparison: Previous exam(s).

CLINICAL DATA: Screening.

EXAM:
DIGITAL SCREENING BILATERAL MAMMOGRAM WITH TOMOSYNTHESIS AND CAD
TECHNIQUE: Bilateral screening digital craniocaudal and mediolateral oblique
mammograms were obtained. Bilateral screening digital breast
tomosynthesis was performed. The images were evaluated with
computer-aided detection.

[R CC synth-2D]
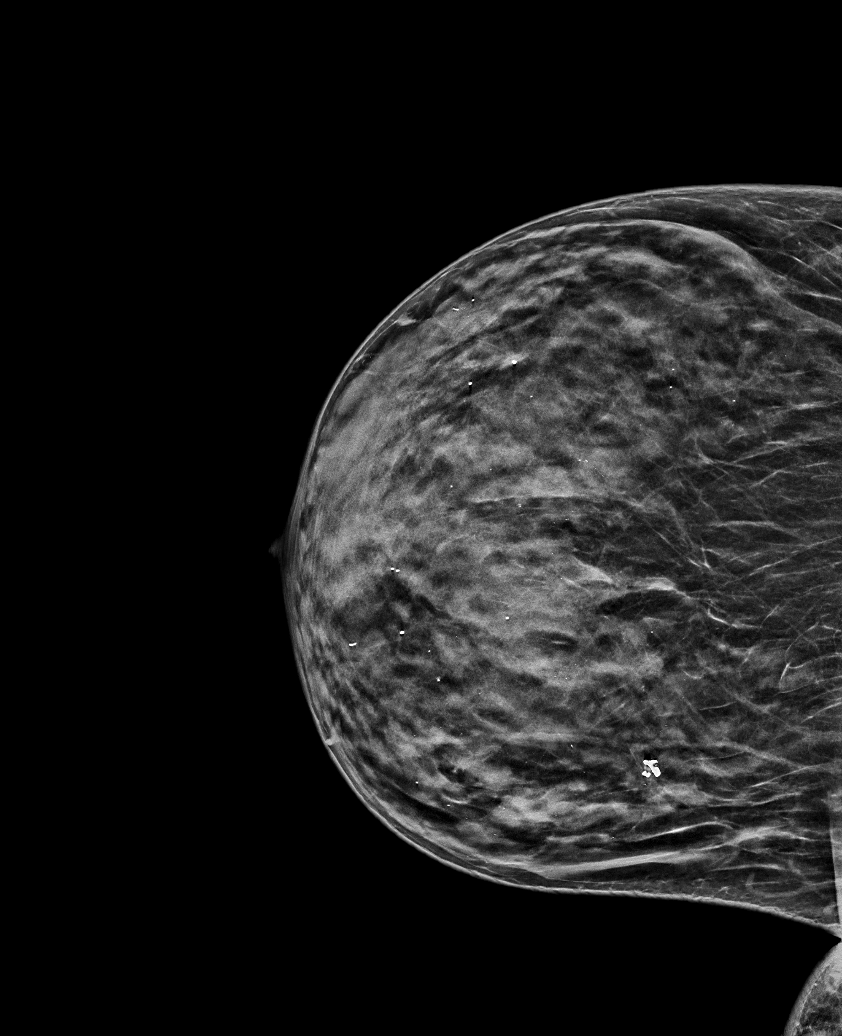

[L CC synth-2D (1 of 2)]
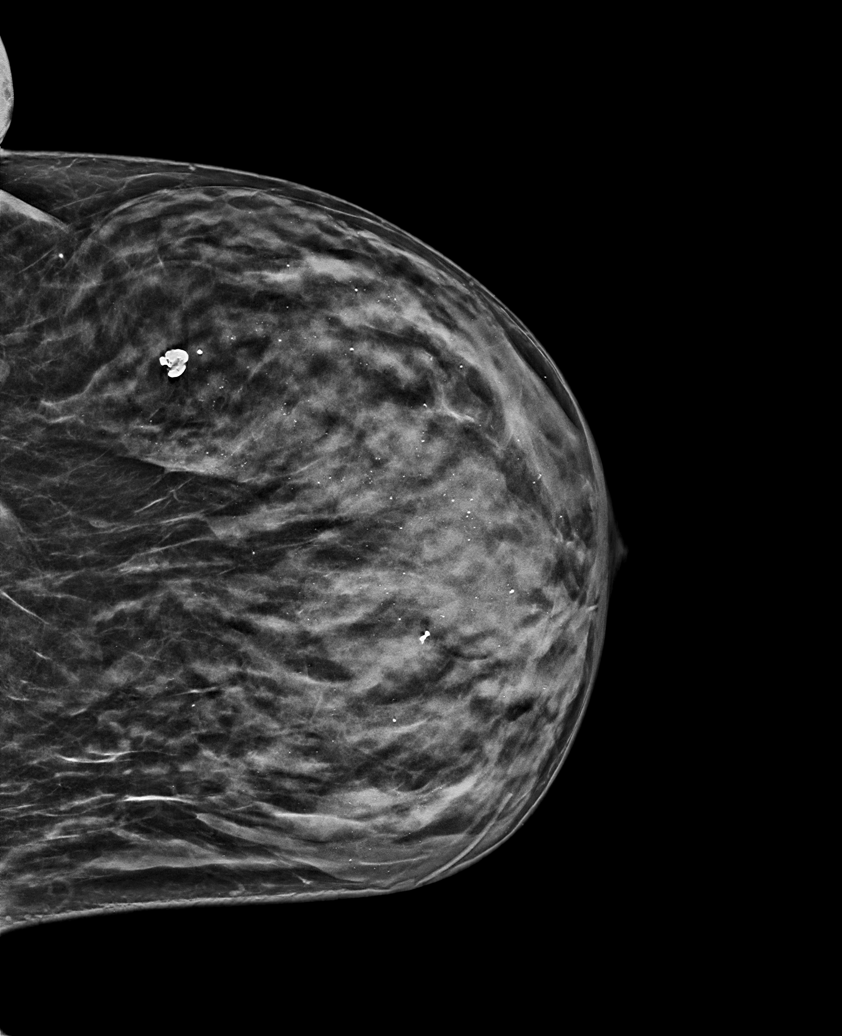

[L CC synth-2D (2 of 2)]
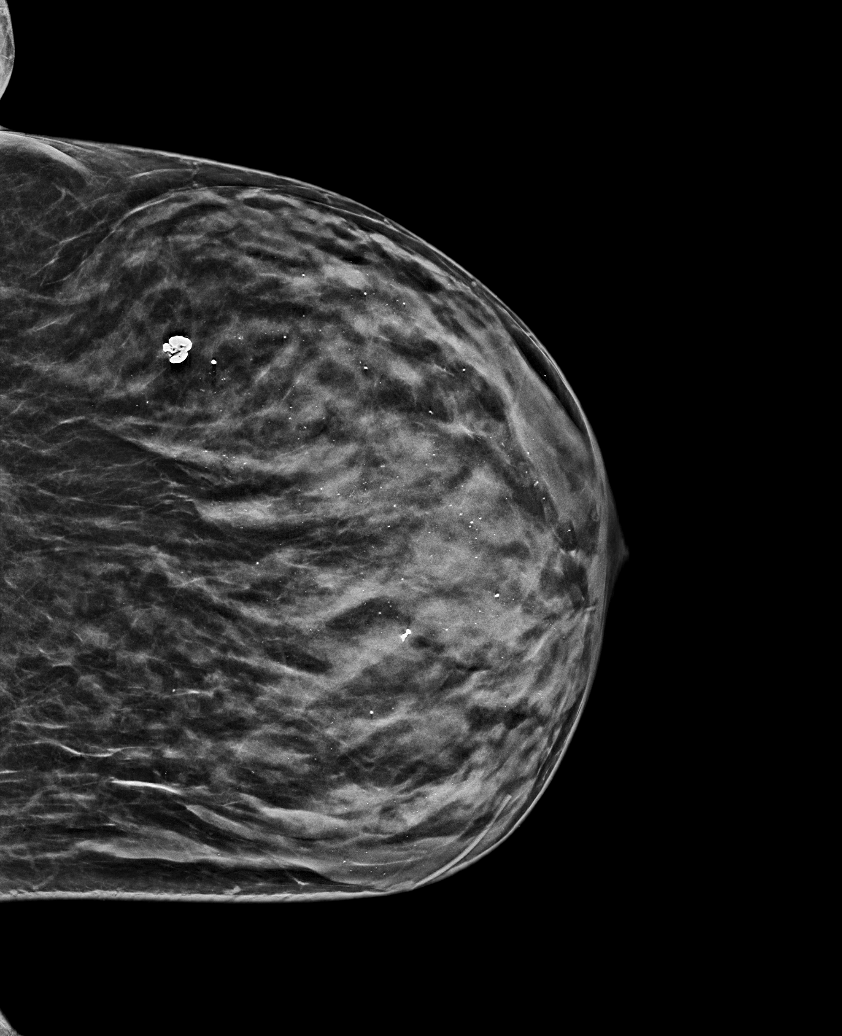

[L MLO synth-2D]
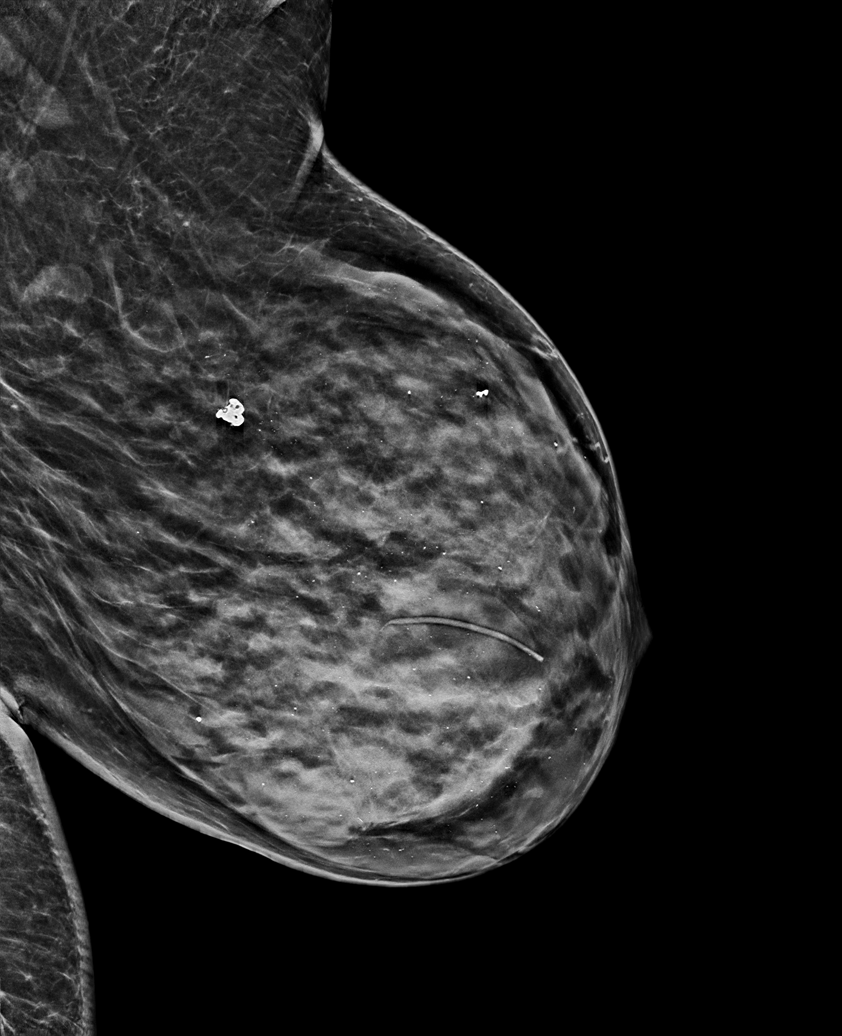

[R MLO synth-2D]
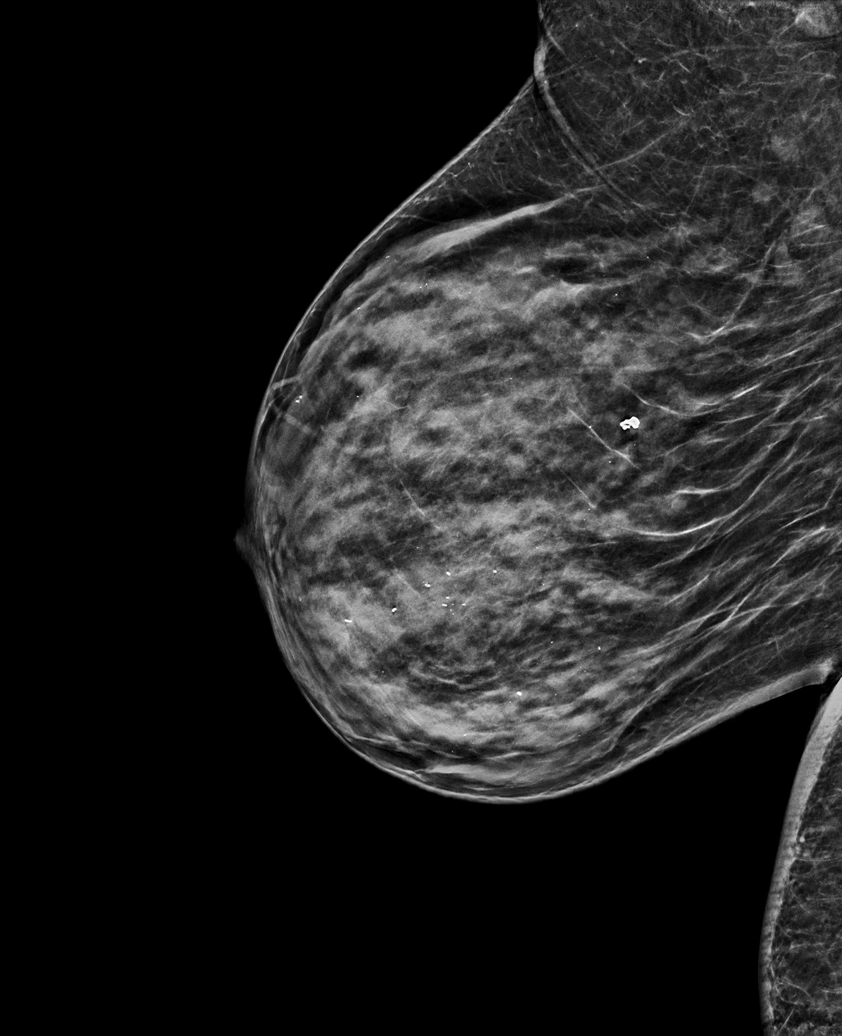

[R MLO tomo · tomo slice 27/53.0]
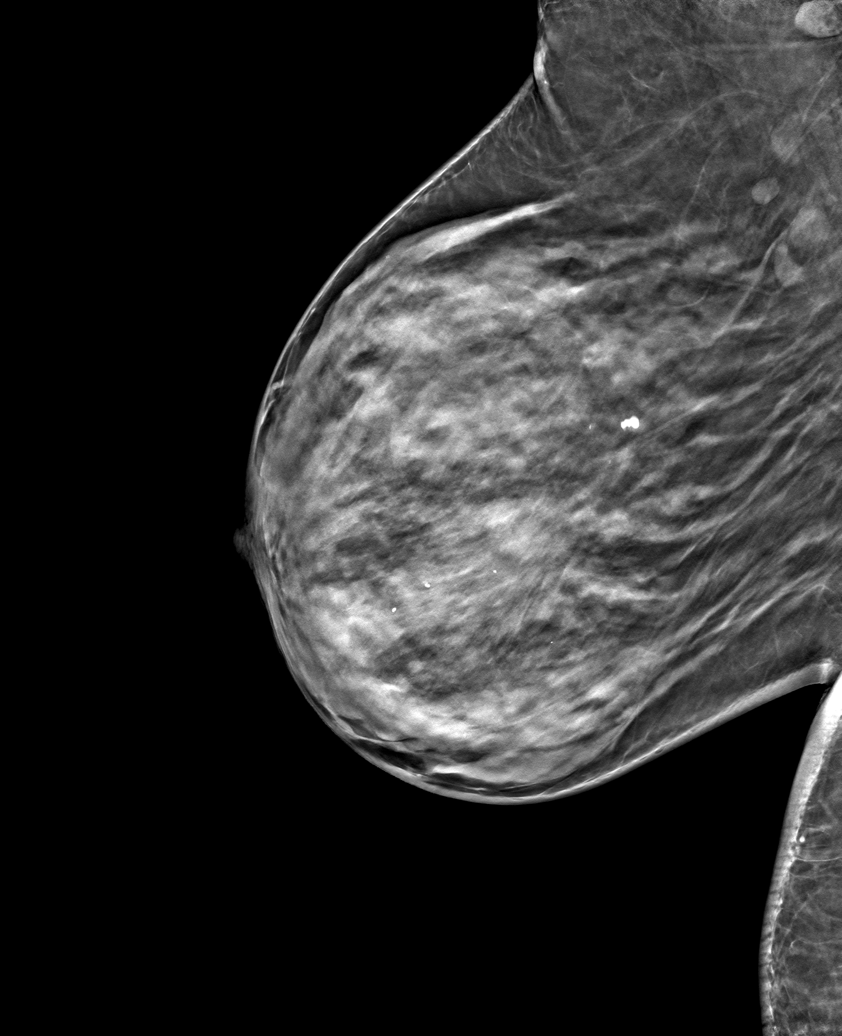

[6 of 30 positions shown; findings below may reference images not displayed]

ACR Breast Density Category d: The breast tissue is extremely dense,
which lowers the sensitivity of mammography
FINDINGS: There are no findings suspicious for malignancy.
IMPRESSION: No mammographic evidence of malignancy. A result letter of this
screening mammogram will be mailed directly to the patient.

RECOMMENDATION:
Screening mammogram in one year. (Code:[7P])

BI-RADS CATEGORY  1: Negative.

## 2021-08-26 ENCOUNTER — Telehealth (INDEPENDENT_AMBULATORY_CARE_PROVIDER_SITE_OTHER): Payer: Self-pay | Admitting: Vascular Surgery

## 2021-08-26 DIAGNOSIS — Z992 Dependence on renal dialysis: Secondary | ICD-10-CM | POA: Diagnosis not present

## 2021-08-26 DIAGNOSIS — N186 End stage renal disease: Secondary | ICD-10-CM | POA: Diagnosis not present

## 2021-08-26 NOTE — Telephone Encounter (Signed)
Called stating that they faxed over orders for patient to be evaluated. Dialysis is thinking the patient has steal syndrome. Patient was last seen 05/2021 with HDA studies and is due to come in 11/2021. I will call to move patients appt up with steal study (fb made aware).   This note is for documentation purposes only

## 2021-08-28 DIAGNOSIS — N186 End stage renal disease: Secondary | ICD-10-CM | POA: Diagnosis not present

## 2021-08-28 DIAGNOSIS — Z992 Dependence on renal dialysis: Secondary | ICD-10-CM | POA: Diagnosis not present

## 2021-08-31 DIAGNOSIS — Z992 Dependence on renal dialysis: Secondary | ICD-10-CM | POA: Diagnosis not present

## 2021-08-31 DIAGNOSIS — N186 End stage renal disease: Secondary | ICD-10-CM | POA: Diagnosis not present

## 2021-09-02 DIAGNOSIS — Z992 Dependence on renal dialysis: Secondary | ICD-10-CM | POA: Diagnosis not present

## 2021-09-02 DIAGNOSIS — N186 End stage renal disease: Secondary | ICD-10-CM | POA: Diagnosis not present

## 2021-09-04 DIAGNOSIS — N186 End stage renal disease: Secondary | ICD-10-CM | POA: Diagnosis not present

## 2021-09-04 DIAGNOSIS — Z992 Dependence on renal dialysis: Secondary | ICD-10-CM | POA: Diagnosis not present

## 2021-09-05 ENCOUNTER — Other Ambulatory Visit: Payer: Self-pay

## 2021-09-05 ENCOUNTER — Inpatient Hospital Stay
Admission: EM | Admit: 2021-09-05 | Discharge: 2021-09-09 | DRG: 177 | Disposition: A | Discharge status: Home / Self Care | Payer: Medicare HMO | Attending: Internal Medicine | Admitting: Internal Medicine

## 2021-09-05 ENCOUNTER — Encounter: Payer: Self-pay | Admitting: Radiology

## 2021-09-05 ENCOUNTER — Emergency Department: Payer: Medicare HMO

## 2021-09-05 DIAGNOSIS — R0602 Shortness of breath: Secondary | ICD-10-CM | POA: Diagnosis not present

## 2021-09-05 DIAGNOSIS — E872 Acidosis: Secondary | ICD-10-CM | POA: Diagnosis not present

## 2021-09-05 DIAGNOSIS — Z87891 Personal history of nicotine dependence: Secondary | ICD-10-CM

## 2021-09-05 DIAGNOSIS — F419 Anxiety disorder, unspecified: Secondary | ICD-10-CM | POA: Diagnosis present

## 2021-09-05 DIAGNOSIS — E1122 Type 2 diabetes mellitus with diabetic chronic kidney disease: Secondary | ICD-10-CM | POA: Diagnosis present

## 2021-09-05 DIAGNOSIS — Z951 Presence of aortocoronary bypass graft: Secondary | ICD-10-CM

## 2021-09-05 DIAGNOSIS — Z8249 Family history of ischemic heart disease and other diseases of the circulatory system: Secondary | ICD-10-CM

## 2021-09-05 DIAGNOSIS — Z91048 Other nonmedicinal substance allergy status: Secondary | ICD-10-CM | POA: Diagnosis not present

## 2021-09-05 DIAGNOSIS — J1282 Pneumonia due to coronavirus disease 2019: Secondary | ICD-10-CM | POA: Diagnosis present

## 2021-09-05 DIAGNOSIS — I160 Hypertensive urgency: Secondary | ICD-10-CM | POA: Diagnosis present

## 2021-09-05 DIAGNOSIS — E785 Hyperlipidemia, unspecified: Secondary | ICD-10-CM | POA: Diagnosis not present

## 2021-09-05 DIAGNOSIS — I132 Hypertensive heart and chronic kidney disease with heart failure and with stage 5 chronic kidney disease, or end stage renal disease: Secondary | ICD-10-CM | POA: Diagnosis present

## 2021-09-05 DIAGNOSIS — R1111 Vomiting without nausea: Secondary | ICD-10-CM | POA: Diagnosis not present

## 2021-09-05 DIAGNOSIS — Z91018 Allergy to other foods: Secondary | ICD-10-CM

## 2021-09-05 DIAGNOSIS — E1165 Type 2 diabetes mellitus with hyperglycemia: Secondary | ICD-10-CM | POA: Diagnosis present

## 2021-09-05 DIAGNOSIS — G4730 Sleep apnea, unspecified: Secondary | ICD-10-CM | POA: Diagnosis present

## 2021-09-05 DIAGNOSIS — F32A Depression, unspecified: Secondary | ICD-10-CM | POA: Diagnosis present

## 2021-09-05 DIAGNOSIS — Z79899 Other long term (current) drug therapy: Secondary | ICD-10-CM

## 2021-09-05 DIAGNOSIS — N2581 Secondary hyperparathyroidism of renal origin: Secondary | ICD-10-CM | POA: Diagnosis not present

## 2021-09-05 DIAGNOSIS — H5462 Unqualified visual loss, left eye, normal vision right eye: Secondary | ICD-10-CM | POA: Diagnosis present

## 2021-09-05 DIAGNOSIS — E876 Hypokalemia: Secondary | ICD-10-CM | POA: Diagnosis present

## 2021-09-05 DIAGNOSIS — I5023 Acute on chronic systolic (congestive) heart failure: Secondary | ICD-10-CM | POA: Diagnosis not present

## 2021-09-05 DIAGNOSIS — E1169 Type 2 diabetes mellitus with other specified complication: Secondary | ICD-10-CM

## 2021-09-05 DIAGNOSIS — K920 Hematemesis: Secondary | ICD-10-CM | POA: Diagnosis not present

## 2021-09-05 DIAGNOSIS — Z8673 Personal history of transient ischemic attack (TIA), and cerebral infarction without residual deficits: Secondary | ICD-10-CM

## 2021-09-05 DIAGNOSIS — R079 Chest pain, unspecified: Secondary | ICD-10-CM

## 2021-09-05 DIAGNOSIS — I509 Heart failure, unspecified: Secondary | ICD-10-CM

## 2021-09-05 DIAGNOSIS — Z888 Allergy status to other drugs, medicaments and biological substances status: Secondary | ICD-10-CM

## 2021-09-05 DIAGNOSIS — J439 Emphysema, unspecified: Secondary | ICD-10-CM | POA: Diagnosis present

## 2021-09-05 DIAGNOSIS — E1140 Type 2 diabetes mellitus with diabetic neuropathy, unspecified: Secondary | ICD-10-CM | POA: Diagnosis present

## 2021-09-05 DIAGNOSIS — I10 Essential (primary) hypertension: Secondary | ICD-10-CM

## 2021-09-05 DIAGNOSIS — R778 Other specified abnormalities of plasma proteins: Secondary | ICD-10-CM | POA: Diagnosis not present

## 2021-09-05 DIAGNOSIS — A419 Sepsis, unspecified organism: Secondary | ICD-10-CM

## 2021-09-05 DIAGNOSIS — N186 End stage renal disease: Secondary | ICD-10-CM | POA: Diagnosis present

## 2021-09-05 DIAGNOSIS — Z992 Dependence on renal dialysis: Secondary | ICD-10-CM

## 2021-09-05 DIAGNOSIS — E1142 Type 2 diabetes mellitus with diabetic polyneuropathy: Secondary | ICD-10-CM | POA: Diagnosis not present

## 2021-09-05 DIAGNOSIS — I2510 Atherosclerotic heart disease of native coronary artery without angina pectoris: Secondary | ICD-10-CM | POA: Diagnosis present

## 2021-09-05 DIAGNOSIS — I5043 Acute on chronic combined systolic (congestive) and diastolic (congestive) heart failure: Secondary | ICD-10-CM | POA: Diagnosis not present

## 2021-09-05 DIAGNOSIS — U071 COVID-19: Secondary | ICD-10-CM | POA: Diagnosis not present

## 2021-09-05 DIAGNOSIS — R0789 Other chest pain: Secondary | ICD-10-CM | POA: Diagnosis not present

## 2021-09-05 DIAGNOSIS — Z794 Long term (current) use of insulin: Secondary | ICD-10-CM

## 2021-09-05 DIAGNOSIS — Z86718 Personal history of other venous thrombosis and embolism: Secondary | ICD-10-CM

## 2021-09-05 DIAGNOSIS — K219 Gastro-esophageal reflux disease without esophagitis: Secondary | ICD-10-CM | POA: Diagnosis present

## 2021-09-05 DIAGNOSIS — J90 Pleural effusion, not elsewhere classified: Secondary | ICD-10-CM | POA: Diagnosis not present

## 2021-09-05 DIAGNOSIS — Z91013 Allergy to seafood: Secondary | ICD-10-CM

## 2021-09-05 DIAGNOSIS — Z7982 Long term (current) use of aspirin: Secondary | ICD-10-CM

## 2021-09-05 DIAGNOSIS — E119 Type 2 diabetes mellitus without complications: Secondary | ICD-10-CM

## 2021-09-05 DIAGNOSIS — I252 Old myocardial infarction: Secondary | ICD-10-CM

## 2021-09-05 HISTORY — DX: Sepsis, unspecified organism: A41.9

## 2021-09-05 LAB — CBC
HCT: 40.3 % (ref 36.0–46.0)
Hemoglobin: 12.9 g/dL (ref 12.0–15.0)
MCH: 28.1 pg (ref 26.0–34.0)
MCHC: 32 g/dL (ref 30.0–36.0)
MCV: 87.8 fL (ref 80.0–100.0)
Platelets: 241 10*3/uL (ref 150–400)
RBC: 4.59 MIL/uL (ref 3.87–5.11)
RDW: 14.6 % (ref 11.5–15.5)
WBC: 7.8 10*3/uL (ref 4.0–10.5)
nRBC: 0 % (ref 0.0–0.2)

## 2021-09-05 LAB — RESP PANEL BY RT-PCR (FLU A&B, COVID) ARPGX2
Influenza A by PCR: NEGATIVE
Influenza B by PCR: NEGATIVE
SARS Coronavirus 2 by RT PCR: POSITIVE — AB

## 2021-09-05 LAB — LACTIC ACID, PLASMA: Lactic Acid, Venous: 2 mmol/L (ref 0.5–1.9)

## 2021-09-05 LAB — CBG MONITORING, ED: Glucose-Capillary: 209 mg/dL — ABNORMAL HIGH (ref 70–99)

## 2021-09-05 LAB — TROPONIN I (HIGH SENSITIVITY): Troponin I (High Sensitivity): 26 ng/L — ABNORMAL HIGH (ref <18)

## 2021-09-05 IMAGING — CR DG CHEST 2V
2 series · 2 of 2 positions shown · non-contrast
Comparison: [DATE]

CLINICAL DATA: Chest pain and shortness of breath.

EXAM:
CHEST - 2 VIEW

[chest lat]
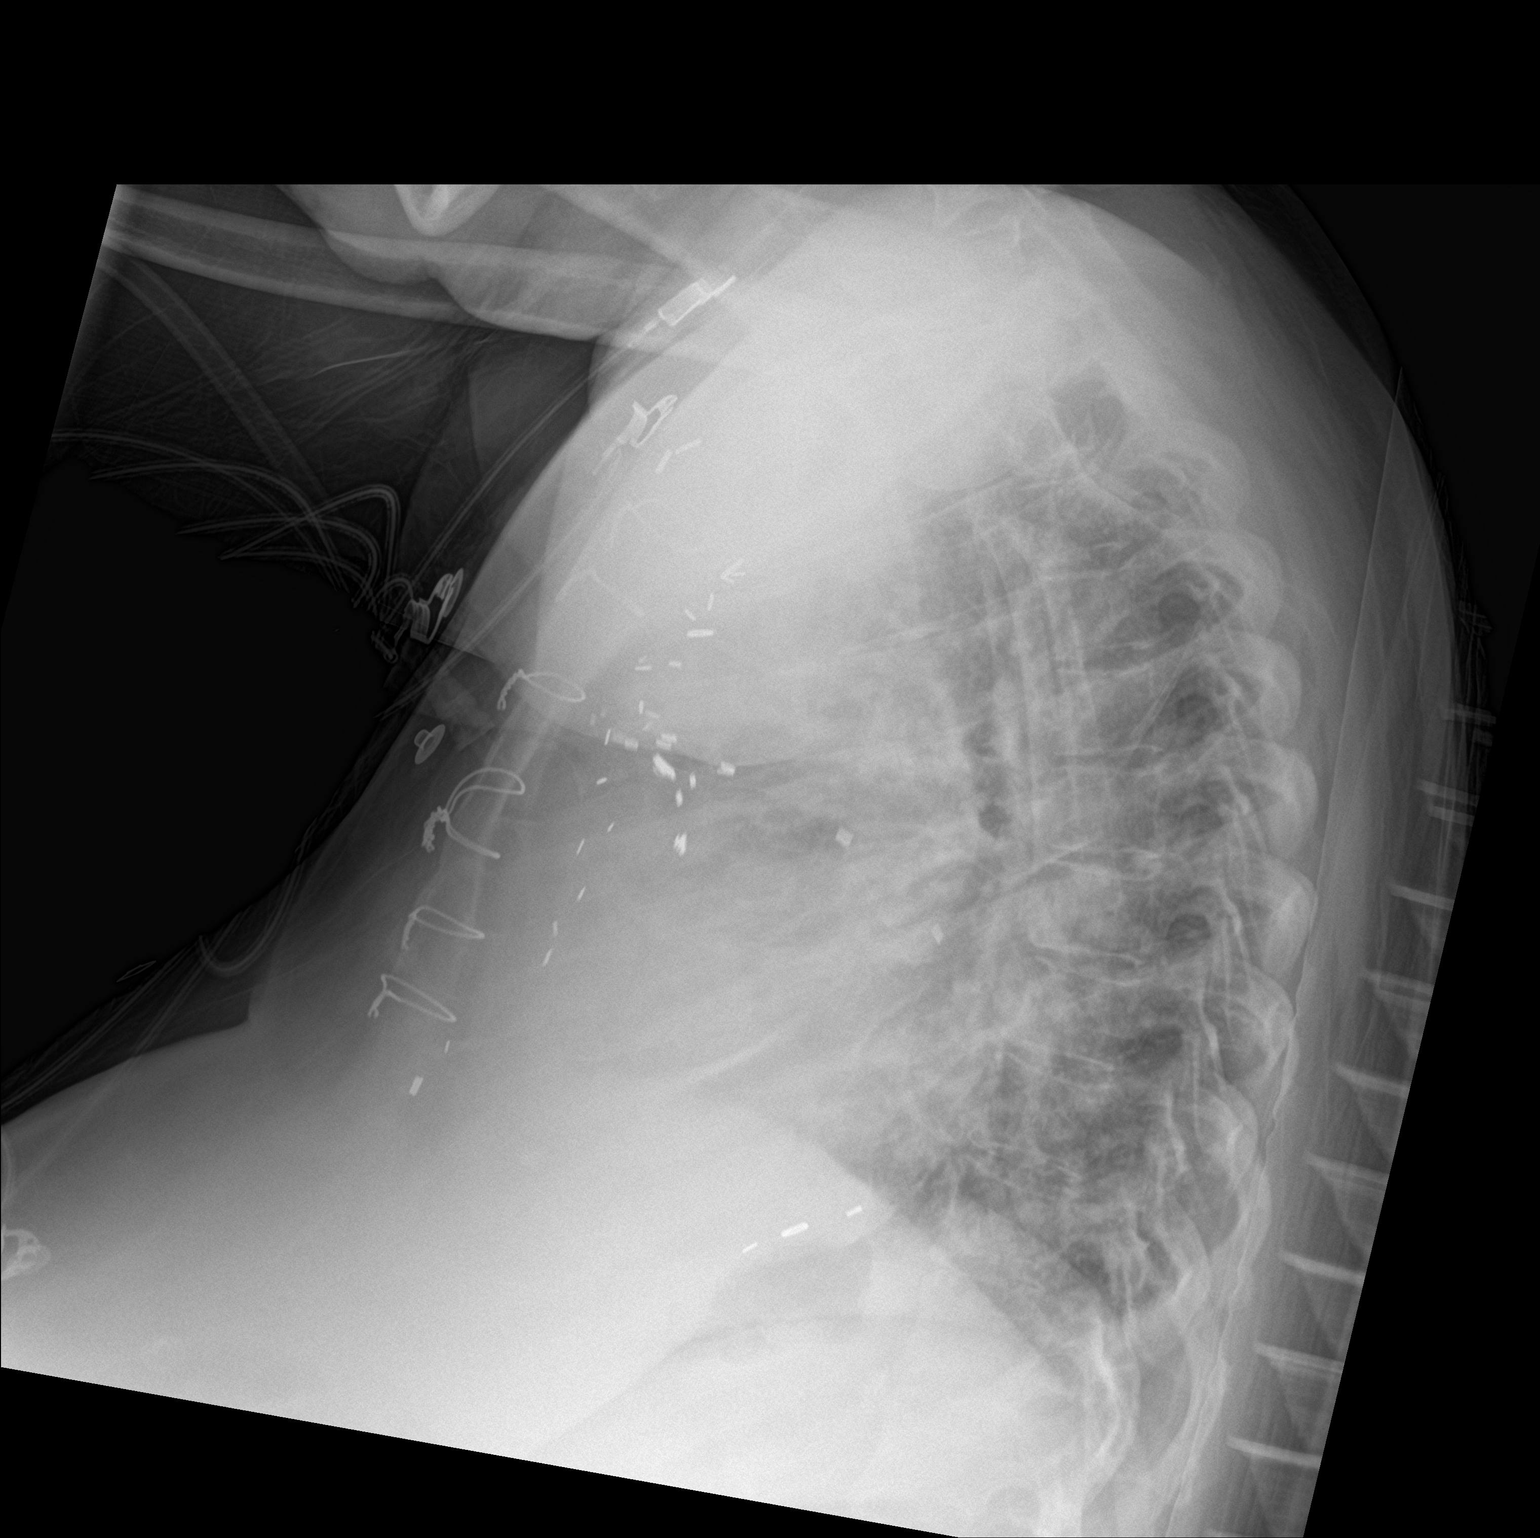

[chest ap]
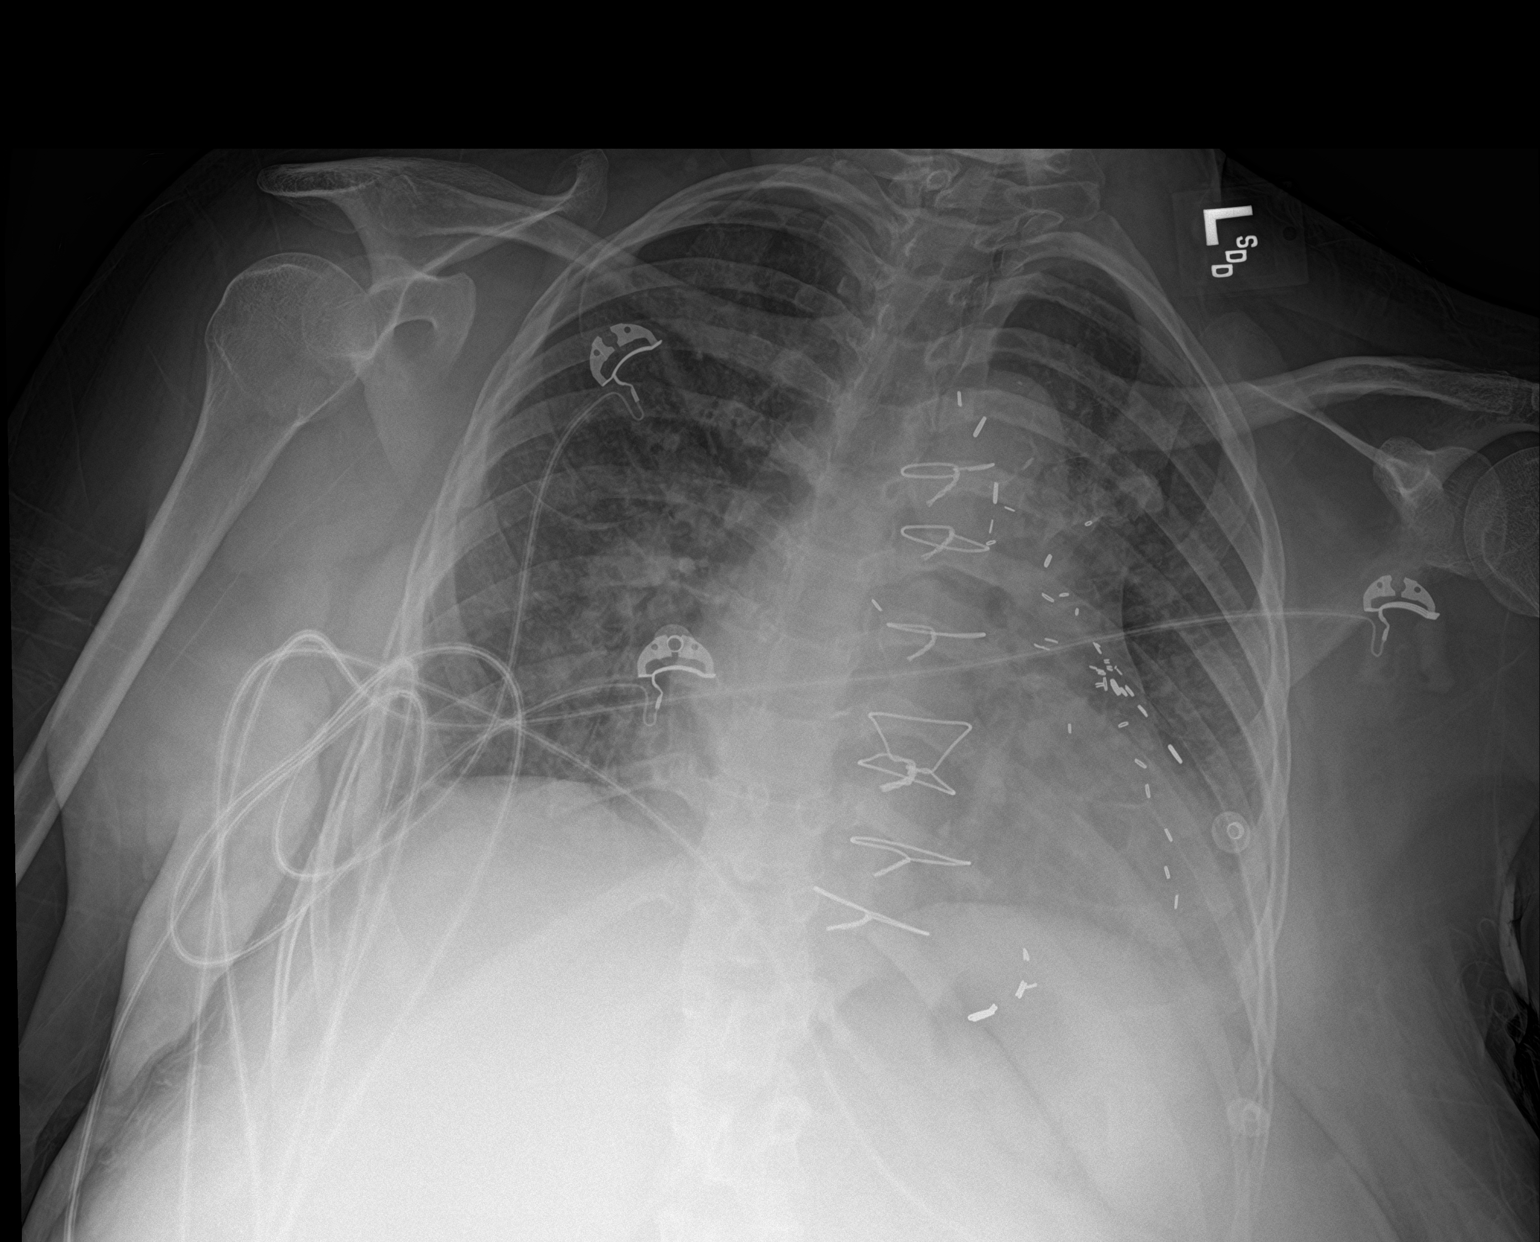

[2 of 2 positions shown; findings below may reference images not displayed]

FINDINGS: Postoperative changes in the mediastinum. Interval removal of
central venous catheter. Shallow inspiration. Mild cardiac
enlargement with mild central vascular congestion and mild perihilar
interstitial pattern, likely early edema. Small left pleural
effusion. No pneumothorax. Calcification of the aorta.
IMPRESSION: Cardiac enlargement with mild vascular congestion, central edema,
and small left pleural effusion. Similar appearance to previous
study.

## 2021-09-05 MED ORDER — ASPIRIN EC 81 MG PO TBEC
81.0000 mg | DELAYED_RELEASE_TABLET | Freq: Every day | ORAL | Status: DC
  Administered 2021-09-06 – 2021-09-09 (×4): 81 mg via ORAL
  Filled 2021-09-05 (×4): qty 1

## 2021-09-05 MED ORDER — FUROSEMIDE 10 MG/ML IJ SOLN
40.0000 mg | Freq: Two times a day (BID) | INTRAMUSCULAR | Status: DC
  Administered 2021-09-06 – 2021-09-08 (×7): 40 mg via INTRAVENOUS
  Filled 2021-09-05 (×8): qty 4

## 2021-09-05 MED ORDER — SODIUM CHLORIDE 0.9 % IV SOLN
200.0000 mg | Freq: Once | INTRAVENOUS | Status: AC
  Administered 2021-09-06: 200 mg via INTRAVENOUS
  Filled 2021-09-05: qty 200

## 2021-09-05 MED ORDER — DEXAMETHASONE SODIUM PHOSPHATE 10 MG/ML IJ SOLN
6.0000 mg | INTRAMUSCULAR | Status: DC
  Administered 2021-09-06 – 2021-09-08 (×3): 6 mg via INTRAVENOUS
  Filled 2021-09-05 (×4): qty 1

## 2021-09-05 MED ORDER — FENOFIBRATE 160 MG PO TABS
160.0000 mg | ORAL_TABLET | Freq: Every day | ORAL | Status: DC
  Administered 2021-09-06 – 2021-09-09 (×4): 160 mg via ORAL
  Filled 2021-09-05 (×6): qty 1

## 2021-09-05 MED ORDER — INSULIN ASPART 100 UNIT/ML IJ SOLN
0.0000 [IU] | Freq: Every day | INTRAMUSCULAR | Status: DC
  Administered 2021-09-06 – 2021-09-07 (×2): 2 [IU] via SUBCUTANEOUS
  Administered 2021-09-08: 3 [IU] via SUBCUTANEOUS
  Filled 2021-09-05 (×3): qty 1

## 2021-09-05 MED ORDER — INSULIN ASPART 100 UNIT/ML IJ SOLN
0.0000 [IU] | Freq: Three times a day (TID) | INTRAMUSCULAR | Status: DC
  Administered 2021-09-06: 5 [IU] via SUBCUTANEOUS
  Administered 2021-09-06: 8 [IU] via SUBCUTANEOUS
  Administered 2021-09-06: 3 [IU] via SUBCUTANEOUS
  Administered 2021-09-07 (×2): 8 [IU] via SUBCUTANEOUS
  Administered 2021-09-08: 15 [IU] via SUBCUTANEOUS
  Administered 2021-09-08: 11 [IU] via SUBCUTANEOUS
  Administered 2021-09-08: 8 [IU] via SUBCUTANEOUS
  Administered 2021-09-09: 3 [IU] via SUBCUTANEOUS
  Filled 2021-09-05 (×9): qty 1

## 2021-09-05 MED ORDER — ISOSORBIDE MONONITRATE ER 30 MG PO TB24
30.0000 mg | ORAL_TABLET | Freq: Every day | ORAL | Status: DC
  Administered 2021-09-06 – 2021-09-09 (×4): 30 mg via ORAL
  Filled 2021-09-05 (×4): qty 1

## 2021-09-05 MED ORDER — SODIUM CHLORIDE 0.9 % IV SOLN: 100.0000 mg | Freq: Every day | INTRAVENOUS | Status: DC

## 2021-09-05 MED ORDER — HEPARIN SODIUM (PORCINE) 5000 UNIT/ML IJ SOLN
5000.0000 [IU] | Freq: Three times a day (TID) | INTRAMUSCULAR | Status: DC
  Administered 2021-09-06 – 2021-09-08 (×9): 5000 [IU] via SUBCUTANEOUS
  Filled 2021-09-05 (×10): qty 1

## 2021-09-05 MED ORDER — ACETAMINOPHEN 325 MG PO TABS
650.0000 mg | ORAL_TABLET | Freq: Four times a day (QID) | ORAL | Status: DC | PRN
  Administered 2021-09-06: 650 mg via ORAL
  Filled 2021-09-05: qty 2

## 2021-09-05 MED ORDER — VANCOMYCIN HCL IN DEXTROSE 1-5 GM/200ML-% IV SOLN: 1000.0000 mg | Freq: Once | INTRAVENOUS | Status: DC

## 2021-09-05 MED ORDER — GUAIFENESIN-DM 100-10 MG/5ML PO SYRP
10.0000 mL | ORAL_SOLUTION | ORAL | Status: DC | PRN
  Administered 2021-09-06: 10 mL via ORAL
  Filled 2021-09-05: qty 10

## 2021-09-05 MED ORDER — ZINC SULFATE 220 (50 ZN) MG PO CAPS
220.0000 mg | ORAL_CAPSULE | Freq: Every day | ORAL | Status: DC
  Administered 2021-09-06 – 2021-09-09 (×4): 220 mg via ORAL
  Filled 2021-09-05 (×4): qty 1

## 2021-09-05 MED ORDER — ONDANSETRON HCL 4 MG/2ML IJ SOLN
4.0000 mg | Freq: Four times a day (QID) | INTRAMUSCULAR | Status: DC | PRN
  Administered 2021-09-08: 4 mg via INTRAVENOUS
  Filled 2021-09-05: qty 2

## 2021-09-05 MED ORDER — PANTOPRAZOLE SODIUM 40 MG PO TBEC
40.0000 mg | DELAYED_RELEASE_TABLET | Freq: Every day | ORAL | Status: DC
  Administered 2021-09-06 – 2021-09-09 (×4): 40 mg via ORAL
  Filled 2021-09-05 (×4): qty 1

## 2021-09-05 MED ORDER — ONDANSETRON HCL 4 MG PO TABS: 4.0000 mg | ORAL_TABLET | Freq: Four times a day (QID) | ORAL | Status: DC | PRN

## 2021-09-05 MED ORDER — ALBUTEROL SULFATE (2.5 MG/3ML) 0.083% IN NEBU
2.5000 mg | INHALATION_SOLUTION | Freq: Four times a day (QID) | RESPIRATORY_TRACT | Status: DC
  Administered 2021-09-06 – 2021-09-07 (×5): 2.5 mg via RESPIRATORY_TRACT
  Filled 2021-09-05 (×5): qty 3

## 2021-09-05 MED ORDER — FENTANYL CITRATE PF 50 MCG/ML IJ SOSY
50.0000 ug | PREFILLED_SYRINGE | INTRAMUSCULAR | Status: DC | PRN
  Administered 2021-09-05: 50 ug via INTRAVENOUS
  Filled 2021-09-05: qty 1

## 2021-09-05 MED ORDER — ASCORBIC ACID 500 MG PO TABS
500.0000 mg | ORAL_TABLET | Freq: Every day | ORAL | Status: DC
  Administered 2021-09-06 – 2021-09-09 (×4): 500 mg via ORAL
  Filled 2021-09-05 (×4): qty 1

## 2021-09-05 MED ORDER — ACETAMINOPHEN 650 MG RE SUPP: 650.0000 mg | Freq: Four times a day (QID) | RECTAL | Status: DC | PRN

## 2021-09-05 MED ORDER — HYDROCOD POLST-CPM POLST ER 10-8 MG/5ML PO SUER
5.0000 mL | Freq: Two times a day (BID) | ORAL | Status: DC | PRN
  Administered 2021-09-06 – 2021-09-07 (×2): 5 mL via ORAL
  Filled 2021-09-05 (×2): qty 5

## 2021-09-05 MED ORDER — SPIRONOLACTONE 25 MG PO TABS
25.0000 mg | ORAL_TABLET | Freq: Every day | ORAL | Status: DC
  Administered 2021-09-06 – 2021-09-09 (×4): 25 mg via ORAL
  Filled 2021-09-05 (×4): qty 1

## 2021-09-05 MED ORDER — CLONIDINE HCL 0.1 MG PO TABS
0.1000 mg | ORAL_TABLET | Freq: Two times a day (BID) | ORAL | Status: DC
  Administered 2021-09-06 – 2021-09-08 (×7): 0.1 mg via ORAL
  Filled 2021-09-05 (×8): qty 1

## 2021-09-05 MED ORDER — SODIUM CHLORIDE 0.9 % IV SOLN: 200.0000 mg | Freq: Once | INTRAVENOUS | Status: DC

## 2021-09-05 MED ORDER — TRAZODONE HCL 50 MG PO TABS
50.0000 mg | ORAL_TABLET | Freq: Every day | ORAL | Status: DC
  Administered 2021-09-06 – 2021-09-08 (×4): 50 mg via ORAL
  Filled 2021-09-05 (×4): qty 1

## 2021-09-05 MED ORDER — SODIUM CHLORIDE 0.9 % IV SOLN: 2.0000 g | Freq: Once | INTRAVENOUS | Status: DC

## 2021-09-05 MED ORDER — ATORVASTATIN CALCIUM 80 MG PO TABS
80.0000 mg | ORAL_TABLET | Freq: Every day | ORAL | Status: DC
  Administered 2021-09-06 – 2021-09-08 (×3): 80 mg via ORAL
  Filled 2021-09-05 (×2): qty 4
  Filled 2021-09-05: qty 1

## 2021-09-05 MED ORDER — SODIUM CHLORIDE 0.9 % IV SOLN: 500.0000 mg | Freq: Once | INTRAVENOUS | Status: DC

## 2021-09-05 MED ORDER — AMLODIPINE BESYLATE 10 MG PO TABS
10.0000 mg | ORAL_TABLET | Freq: Every day | ORAL | Status: DC
  Administered 2021-09-06 – 2021-09-09 (×4): 10 mg via ORAL
  Filled 2021-09-05 (×2): qty 1
  Filled 2021-09-05 (×2): qty 2

## 2021-09-05 MED ORDER — TOPIRAMATE 25 MG PO TABS
25.0000 mg | ORAL_TABLET | Freq: Every day | ORAL | Status: DC
  Administered 2021-09-06 – 2021-09-09 (×4): 25 mg via ORAL
  Filled 2021-09-05 (×5): qty 1

## 2021-09-05 MED ORDER — REMDESIVIR 100 MG IV SOLR
100.0000 mg | Freq: Every day | INTRAVENOUS | Status: DC
Start: 2021-09-06 — End: 2021-09-10
  Administered 2021-09-06 – 2021-09-08 (×3): 100 mg via INTRAVENOUS
  Filled 2021-09-05 (×4): qty 20
  Filled 2021-09-05: qty 100

## 2021-09-05 MED ORDER — LABETALOL HCL 5 MG/ML IV SOLN
5.0000 mg | Freq: Once | INTRAVENOUS | Status: AC
  Administered 2021-09-05: 5 mg via INTRAVENOUS
  Filled 2021-09-05: qty 4

## 2021-09-05 MED ORDER — CARVEDILOL 25 MG PO TABS
25.0000 mg | ORAL_TABLET | Freq: Two times a day (BID) | ORAL | Status: DC
  Administered 2021-09-06 – 2021-09-08 (×7): 25 mg via ORAL
  Filled 2021-09-05 (×8): qty 1

## 2021-09-05 NOTE — Progress Notes (Signed)
Remdesivir - Pharmacy Brief Note   O:  ALT: N/A CXR: Cardiac enlargement with mild vascular congestion, central edema, and small left pleural effusion. SpO2: 98-100% on 1L [place]   A/P:  Remdesivir 200 mg IVPB once followed by 100 mg IVPB daily x 4 days.   Pernell Dupre, PharmD, BCPS Clinical Pharmacist 09/05/2021 9:34 PM

## 2021-09-05 NOTE — Progress Notes (Signed)
Pt being followed by [facility] for sepsis protocol. 

## 2021-09-05 NOTE — ED Notes (Signed)
Waiting on remdesivir from pharmacy

## 2021-09-05 NOTE — Progress Notes (Signed)
PHARMACY -  BRIEF ANTIBIOTIC NOTE   Pharmacy has received consult(s) for Cefepime and vancomycin from an ED provider.  The patient's profile has been reviewed for ht/wt/allergies/indication/available labs.    One time order(s) placed for Vancomycin 1g and Cefepime 2g   Further antibiotics/pharmacy consults should be ordered by admitting physician if indicated.                       Thank you, Pernell Dupre, PharmD, BCPS Clinical Pharmacist 09/05/2021 9:12 PM

## 2021-09-05 NOTE — ED Triage Notes (Signed)
First RN Note: pt to ED via [facility] with c/o SOB and CP. Per EMS pt is dialysis patient, general malaise started yesterday after dialysis. Per EMS pt with bigeminal PVC's, per EMS correlated with pt c/o severe substernal CP. Per EMS pt is MWF dialysis patient.   30-40RR 98%  180/92 CBG 280 22g L hand

## 2021-09-05 NOTE — ED Triage Notes (Signed)
Patient reports having chest pain and shortness of breath since yesterday after dialysis.  Reports has gotten worse today.

## 2021-09-05 NOTE — ED Notes (Signed)
Only able to obtain 1 set of BC. Pt is dialysis patient and is limited to left arm.

## 2021-09-05 NOTE — ED Provider Notes (Signed)
[facility] Emergency Department Provider Note    Event Date/Time   First MD Initiated Contact with Patient 09/05/21 2001     (approximate)  I have reviewed the triage vital signs and the nursing notes.   HISTORY  Chief Complaint Chest Pain    HPI Terry Arroyo is a 60 y.o. female with extensive past medical history end-stage renal disease on dialysis presents to the ER for worsening shortness of breath and chest pain.  Symptoms have been ongoing for the past 36 hours.  She is having fevers and chills.  Having nonproductive cough.  No known sick contacts no nausea or vomiting.  No dysuria.  No rashes.  States that the pain is stabbing in nature.  Past Medical History:  Diagnosis Date   Anemia    Angina at rest [facility])    Angioedema 03/20/2021   Lisinopril   Anterior cerebral aneurysm    approximately 5 mm; left MCA    Anxiety    Aortic atherosclerosis (HCC)    Asthma    Blind left eye    COPD (chronic obstructive pulmonary disease) (HCC)    Coronary artery disease    Depression    DOE (dyspnea on exertion)    ESRD (end stage renal disease) (HCC)    Gait instability    GERD (gastroesophageal reflux disease)    HFrEF (heart failure with reduced ejection fraction) ([place])    History of 2019 novel coronavirus disease (COVID-19) 03/2019   [place]   History of DVT (deep vein thrombosis)    Hx of CABG 2018   x 4; performed in [place]   Hyperlipidemia    Hypertension    MI (myocardial infarction) ([place]) 2018   no stents   Pituitary adenoma ([place])    Secondary hyperparathyroidism ([place])    Sleep apnea    non-compliant with nocturnal PAP therapy   Stroke [facility]) 2014   stated affected left side.   T2DM (type 2 diabetes mellitus) ([place])    Family History  Problem Relation Age of Onset   CAD Mother    CAD Brother    Breast cancer Sister 67   Past Surgical History:  Procedure Laterality Date   AV FISTULA PLACEMENT  Right 01/28/2021   Procedure: ARTERIOVENOUS (AV) FISTULA CREATION;  Surgeon: Katha Cabal, MD;  Location: [facility] ORS;  Service: Vascular;  Laterality: Right;   AV FISTULA PLACEMENT Right 05/13/2021   Procedure: INSERTION OF ARTERIOVENOUS (AV) GORE-TEX GRAFT ARM ( BRACHIAL AXILLARY );  Surgeon: Katha Cabal, MD;  Location: [facility] ORS;  Service: Vascular;  Laterality: Right;   [place]   pituitary tumor   BREAST EXCISIONAL BIOPSY Left yrs ago   benign   CESAREAN SECTION     CORONARY ARTERY BYPASS GRAFT  2018   x 4 vessels; performed in [place] N/A 11/07/2020   Procedure: DIALYSIS/PERMA CATHETER INSERTION;  Surgeon: Algernon Huxley, MD;  Location: [place] CV LAB;  Service: Cardiovascular;  Laterality: N/A;   DIALYSIS/PERMA CATHETER REMOVAL N/A 06/12/2021   Procedure: DIALYSIS/PERMA CATHETER REMOVAL;  Surgeon: Algernon Huxley, MD;  Location: [place] CV LAB;  Service: Cardiovascular;  Laterality: N/A;   EYE SURGERY Right 2021   cataracts    PERIPHERAL VASCULAR THROMBECTOMY Right 04/03/2021   Procedure: A/V Fistulagram ;  Surgeon: Katha Cabal, MD;  Location: [place] [facility];  Service: Cardiovascular;  Laterality: Right;  Patient Active Problem List   Diagnosis Date Noted   Sepsis ([place]) 09/05/2021   Acute CHF (congestive heart failure) ([place]) 09/05/2021   Fluid overload 06/11/2021   Hypokalemia 06/11/2021   Acute pulmonary edema (HCC)    COPD (chronic obstructive pulmonary disease) with emphysema ([place]) 02/09/2021   Chronic sphenoidal sinusitis 01/01/2021   Contact dermatitis and other eczema due to other specified agent 01/01/2021   Hydronephrosis, left 01/01/2021   Other diseases of nasal cavity and sinuses(478.19) 01/01/2021   ESRD on dialysis ([place])    Reactive thrombocytosis 11/08/2020   Nephrotic syndrome 11/04/2020   SOB (shortness of breath) 11/01/2020   Acute on chronic combined systolic and diastolic CHF  (congestive heart failure) ([place]) 10/21/2020   CAD (coronary artery disease) 10/21/2020   Hyperlipidemia associated with type 2 diabetes mellitus ([place]) 10/21/2020   HFrEF (heart failure with reduced ejection fraction) ([place])    Acute respiratory distress    COPD exacerbation ([place]) 10/10/2020   Kidney hematoma 09/13/2020   Hypertension associated with diabetes ([place])    Acute renal failure superimposed on stage 4 chronic kidney disease ([place])    Hematuria 09/12/2020   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[place]) 09/03/2020   Blind left eye 09/03/2020   Brain aneurysm 09/03/2020   Esotropia 09/03/2020   Lesion of ulnar nerve 09/03/2020   Mixed hyperlipidemia 09/03/2020   Sensory hearing loss, bilateral 09/03/2020   Tear film insufficiency 09/03/2020   Health maintenance examination 06/27/2020   Secondary hyperparathyroidism of renal origin ([place]) 04/24/2020   Anemia in chronic kidney disease 03/10/2020   Benign hypertensive kidney disease with chronic kidney disease 03/10/2020   Hyposmolality and/or hyponatremia 03/10/2020   Stage 3b chronic kidney disease ([place]) 03/10/2020   Calf tenderness    Acute on chronic heart failure ([place]) 02/27/2020   Leg edema    DM2 (diabetes mellitus, type 2) ([place]) 02/26/2020   CKD (chronic kidney disease) stage 4, GFR 15-29 ml/min (HCC) 02/26/2020   Hyponatremia 02/26/2020   Anasarca 02/26/2020   Proteinuria 02/26/2020   Hypoalbuminemia 02/26/2020   Elevated troponin 02/26/2020   Acute on chronic systolic CHF (congestive heart failure) ([place]) 02/26/2020   Aortic atherosclerosis ([place]) 02/18/2020   Hyperglycemia    Nausea    Hypertensive emergency    Hypertensive urgency 11/24/2019   Chest pain 11/23/2019   OSA (obstructive sleep apnea) 04/10/2019   Mild episode of recurrent major depressive disorder ([place]) 04/10/2019   ACS (acute coronary syndrome) ([place]) 03/24/2019   Pneumonia due to COVID-19 virus 03/24/2019   Hypertensive  heart/kidney disease w/chronic kidney disease stage III ([place]) 10/21/2017   Mild nonproliferative diabetic retinopathy of right eye associated with type 2 diabetes mellitus ([place]) 09/09/2017   Genital herpes 05/07/2017   DVT (deep venous thrombosis) ([place]) 12/30/2016   PVD (posterior vitreous detachment), right eye 01/09/2016   Diabetic peripheral angiopathy ([place]) 01/03/2016   Age-related nuclear cataract of both eyes 10/24/2015   Chronic nonintractable headache 10/02/2015   Dry eyes, bilateral 02/13/2015   Proptosis 12/18/2014   Iritis of right eye 12/03/2014   Anemia 08/30/2014   Depression 08/30/2014      Prior to Admission medications   Medication Sig Start Date End Date Taking? Authorizing Provider  acetaminophen (TYLENOL) 500 MG tablet Take 500-1,000 mg by mouth every 6 (six) hours as needed for mild pain or moderate pain.   Yes [provider]  albuterol (VENTOLIN HFA) 108 (90 Base) MCG/ACT inhaler Inhale 2 puffs into the lungs every 6 (six)  hours as needed for wheezing or shortness of breath.   Yes [provider]  amLODipine (NORVASC) 10 MG tablet Take 1 tablet (10 mg total) by mouth daily. 03/03/20  Yes Lorella Nimrod, MD  aspirin 81 MG EC tablet Take 1 tablet (81 mg total) by mouth daily. 02/10/21  Yes Max Sane, MD  atorvastatin (LIPITOR) 80 MG tablet Take 1 tablet (80 mg total) by mouth daily at 6 PM. 11/26/19  Yes Lavina Hamman, MD  cloNIDine (CATAPRES) 0.1 MG tablet Take 0.1 mg by mouth 2 (two) times daily. (Non-dialysis days)   Yes [provider]  fenofibrate (TRICOR) 145 MG tablet Take 145 mg by mouth daily.   Yes [provider]  furosemide (LASIX) 40 MG tablet Take 40 mg by mouth daily.   Yes [provider]  insulin aspart protamine- aspart (NOVOLOG MIX 70/30) (70-30) 100 UNIT/ML injection Inject 0.15 mLs (15 Units total) into the skin 2 (two) times daily with a meal. 11/12/20  Yes Fritzi Mandes, MD  isosorbide mononitrate  (IMDUR) 30 MG 24 hr tablet Take 30 mg by mouth daily.   Yes [provider]  loperamide (IMODIUM A-D) 2 MG tablet Take 4 mg by mouth 4 (four) times daily as needed for diarrhea or loose stools.   Yes [provider]  mometasone-formoterol (DULERA) 200-5 MCG/ACT AERO Inhale 2 puffs into the lungs 2 (two) times daily.   Yes [provider]  omeprazole (PRILOSEC) 40 MG capsule Take 40 mg by mouth daily. 09/22/20  Yes [provider]  polyethylene glycol (MIRALAX / GLYCOLAX) 17 g packet Take 17 g by mouth daily. 02/11/21  Yes Lorella Nimrod, MD  potassium chloride (KLOR-CON) 10 MEQ tablet Take 10 mEq by mouth daily.   Yes [provider]  spironolactone (ALDACTONE) 25 MG tablet Take 1 tablet (25 mg total) by mouth daily. 06/16/21  Yes Alma Friendly, MD  topiramate (TOPAMAX) 25 MG tablet Take 25 mg by mouth daily. 08/19/20  Yes [provider]  traZODone (DESYREL) 50 MG tablet Take 50 mg by mouth at bedtime.   Yes [provider]  vitamin B-12 (CYANOCOBALAMIN) 1000 MCG tablet Take 1,000 mcg by mouth daily.   Yes [provider]  ammonium lactate (LAC-HYDRIN) 12 % lotion Apply 1 application topically daily.    [provider]  bismuth subsalicylate (PEPTO BISMOL) 262 MG/15ML suspension Take 30 mLs by mouth every 6 (six) hours as needed for indigestion or diarrhea or loose stools.    [provider]  carvedilol (COREG) 25 MG tablet Take 25 mg by mouth 2 (two) times daily.    [provider]  mirtazapine (REMERON) 30 MG tablet Take 1 tablet (30 mg total) by mouth at bedtime. Patient not taking: No sig reported 11/26/19   Lavina Hamman, MD    Allergies Hydralazine, Kiwi extract, Lisinopril, Shellfish allergy, Betadine [povidone iodine], Metformin, and Tape    Social History Social History   Tobacco Use   Smoking status: Former    Types: Cigarettes    Quit date: 2018    Years since quitting: 4.7    Smokeless tobacco: Never  Vaping Use   Vaping Use: Never used  Substance Use Topics   Alcohol use: Not Currently   Drug use: Not Currently    Review of Systems Patient denies headaches, rhinorrhea, blurry vision, numbness, shortness of breath, chest pain, edema, cough, abdominal pain, nausea, vomiting, diarrhea, dysuria, fevers, rashes or hallucinations unless otherwise stated above in  HPI. ____________________________________________   PHYSICAL EXAM:  VITAL SIGNS: Vitals:   09/05/21 2030 09/05/21 2130  BP: (!) 181/87 (!) 176/82  Pulse: (!) 111 72  Resp: (!) 25 (!) 21  Temp:    SpO2: 93% 100%    Constitutional: Alert and oriented. Chronically ill appearing Eyes: Conjunctivae are normal.  Head: Atraumatic. Nose: No congestion/rhinnorhea. Mouth/Throat: Mucous membranes are moist.   Neck: No stridor. Painless ROM.  Cardiovascular: Normal rate, regular rhythm. Grossly normal heart sounds.  Good peripheral circulation. Respiratory: mild tachypnea, inspiratory crackles in bases, occasional wheeze Gastrointestinal: Soft and nontender. No distention. No abdominal bruits. No CVA tenderness. Genitourinary:  Musculoskeletal: No lower extremity tenderness nor edema.  No joint effusions. Neurologic:  Normal speech and language. No gross focal neurologic deficits are appreciated. No facial droop Skin:  Skin is warm, dry and intact. No rash noted. Psychiatric: Mood and affect are normal. Speech and behavior are normal.  ____________________________________________   LABS (all labs ordered are listed, but only abnormal results are displayed)  Results for orders placed or performed during the hospital encounter of 09/05/21 (from the past 24 hour(s))  CBC     Status: None   Collection Time: 09/05/21  7:56 PM  Result Value Ref Range   WBC 7.8 4.0 - 10.5 K/uL   RBC 4.59 3.87 - 5.11 MIL/uL   Hemoglobin 12.9 12.0 - 15.0 g/dL   HCT 40.3 36.0 - 46.0 %   MCV 87.8 80.0 - 100.0 fL   MCH  28.1 26.0 - 34.0 pg   MCHC 32.0 30.0 - 36.0 g/dL   RDW 14.6 11.5 - 15.5 %   Platelets 241 150 - 400 K/uL   nRBC 0.0 0.0 - 0.2 %  Troponin I (High Sensitivity)     Status: Abnormal   Collection Time: 09/05/21  7:56 PM  Result Value Ref Range   Troponin I (High Sensitivity) 26 (H) <18 ng/L  Resp Panel by RT-PCR (Flu A&B, Covid) Nasopharyngeal Swab     Status: Abnormal   Collection Time: 09/05/21  8:18 PM   Specimen: Nasopharyngeal Swab; Nasopharyngeal(NP) swabs in vial transport medium  Result Value Ref Range   SARS Coronavirus 2 by RT PCR POSITIVE (A) NEGATIVE   Influenza A by PCR NEGATIVE NEGATIVE   Influenza B by PCR NEGATIVE NEGATIVE  Lactic acid, plasma     Status: Abnormal   Collection Time: 09/05/21  8:18 PM  Result Value Ref Range   Lactic Acid, Venous 2.0 (HH) 0.5 - 1.9 mmol/L   ____________________________________________  EKG My review and personal interpretation at Time: 19:37   Indication: sob  Rate: 70  Rhythm: sinus Axis: normal Other: normal intervals, non speciifc st abn ____________________________________________  RADIOLOGY  I personally reviewed all radiographic images ordered to evaluate for the above acute complaints and reviewed radiology reports and findings.  These findings were personally discussed with the patient.  Please see medical record for radiology report.  ____________________________________________   PROCEDURES  Procedure(s) performed:  Procedures    Critical Care performed: no ____________________________________________   INITIAL IMPRESSION / ASSESSMENT AND PLAN / ED COURSE  Pertinent labs & imaging results that were available during my care of the patient were reviewed by me and considered in my medical decision making (see chart for details).   DDX: Asthma, covid, copd, CHF, pna, ptx, malignancy, Pe, anemia   Terry Arroyo is a 60 y.o. who presents to the ED with presentation as described above.  Suspect contacting her  airway is  not hyper toxic mild tachypneic.  Given her extensive past medical history with multiple chronic comorbidities could cause her symptoms blood work as well as imaging will be sent for the above differential.  She does have low-grade temp orally therefore I suspect sepsis.  Will order blood cultures as well as antibiotics.  Clinical Course as of 09/05/21 2211  Sat Sep 05, 2021  2107 Lactate is mildly elevated. [PR]  2133 Patient's COVID test is positive.  I think this best explains her presentation therefore we will hold off on antibiotics at this time.  Will order remdesivir.  Her given her age and comorbidities and presenting symptoms were discussed with hospitalist for admission. [PR]    Clinical Course User Index [PR] Merlyn Lot, MD    The patient was evaluated in Emergency Department today for the symptoms described in the history of present illness. He/she was evaluated in the context of the global COVID-19 pandemic, which necessitated consideration that the patient might be at risk for infection with the SARS-CoV-2 virus that causes COVID-19. Institutional protocols and algorithms that pertain to the evaluation of patients at risk for COVID-19 are in a state of rapid change based on information released by regulatory bodies including the CDC and federal and state organizations. These policies and algorithms were followed during the patient's care in the ED.  As part of my medical decision making, I reviewed the following data within the [place] notes reviewed and incorporated, Labs reviewed, notes from prior ED visits and [place] Controlled Substance Database   ____________________________________________   FINAL CLINICAL IMPRESSION(S) / ED DIAGNOSES  Final diagnoses:  Chest pain, unspecified type  COVID-19      NEW MEDICATIONS STARTED DURING THIS VISIT:  New Prescriptions   No medications on file     Note:  This document was prepared  using Dragon voice recognition software and may include unintentional dictation errors.    Merlyn Lot, MD 09/05/21 2211

## 2021-09-05 NOTE — H&P (Signed)
History and Physical    Terry Arroyo RKY:[id number] DOB: 1961/06/03 DOA: 09/05/2021  PCP: Kirk Ruths, MD   Patient coming from: home  I have personally briefly reviewed patient's old medical records in [place]  Chief Complaint: shortness of breath  HPI: Terry Arroyo is a 60 y.o. female with medical history significant for Chronic combined heart failure, last EF 06/13/21 35 to 40%, CAD s/p CABG, DM, ESRD on HD MWF, COPD, who presents to the ED with a complaint of shortness of breath and chest pain that started after dialysis on 9/16.  Patient also has a dry cough, fevers and chills.  Describes the chest pain and central chest and stabbing.  It is nonradiating.  Has no associated nausea, vomiting or diaphoresis.  Patient received only half of her dialysis treatment the day prior.  ED course: On arrival temperature 100.2, pulse 111, BP 181/87 respirations 20 with O2 sat 95% on room air Blood work: Normal WBC with lactic acid 2.0, troponin 26.  BMP pending  EKG, personally viewed and interpreted: NSR at 73 with no acute ST-T wave changes  Imaging: Chest x-ray, cardiac enlargement with mild vascular congestion, central edema and small left pleural effusion, similar appearance to previous study  Patient treated with labetalol 5 mg and started on remdesivir.  Hospitalist consulted for admission.  Review of Systems: As per HPI otherwise all other systems on review of systems negative.    Past Medical History:  Diagnosis Date   Anemia    Angina at rest [facility])    Angioedema 03/20/2021   Lisinopril   Anterior cerebral aneurysm    approximately 5 mm; left MCA    Anxiety    Aortic atherosclerosis (HCC)    Asthma    Blind left eye    COPD (chronic obstructive pulmonary disease) (HCC)    Coronary artery disease    Depression    DOE (dyspnea on exertion)    ESRD (end stage renal disease) (HCC)    Gait instability    GERD (gastroesophageal reflux disease)    HFrEF  (heart failure with reduced ejection fraction) ([place])    History of 2019 novel coronavirus disease (COVID-19) 03/2019   [place]   History of DVT (deep vein thrombosis)    Hx of CABG 2018   x 4; performed in [place]   Hyperlipidemia    Hypertension    MI (myocardial infarction) ([place]) 2018   no stents   Pituitary adenoma ([place])    Secondary hyperparathyroidism ([place])    Sleep apnea    non-compliant with nocturnal PAP therapy   Stroke [facility]) 2014   stated affected left side.   T2DM (type 2 diabetes mellitus) ([place])     Past Surgical History:  Procedure Laterality Date   AV FISTULA PLACEMENT Right 01/28/2021   Procedure: ARTERIOVENOUS (AV) FISTULA CREATION;  Surgeon: Katha Cabal, MD;  Location: [facility] ORS;  Service: Vascular;  Laterality: Right;   AV FISTULA PLACEMENT Right 05/13/2021   Procedure: INSERTION OF ARTERIOVENOUS (AV) GORE-TEX GRAFT ARM ( BRACHIAL AXILLARY );  Surgeon: Katha Cabal, MD;  Location: [facility] ORS;  Service: Vascular;  Laterality: Right;   [place]   pituitary tumor   BREAST EXCISIONAL BIOPSY Left yrs ago   benign   CESAREAN SECTION     CORONARY ARTERY BYPASS GRAFT  2018   x 4 vessels; performed in [place] N/A 11/07/2020  Procedure: DIALYSIS/PERMA CATHETER INSERTION;  Surgeon: Algernon Huxley, MD;  Location: [place] CV LAB;  Service: Cardiovascular;  Laterality: N/A;   DIALYSIS/PERMA CATHETER REMOVAL N/A 06/12/2021   Procedure: DIALYSIS/PERMA CATHETER REMOVAL;  Surgeon: Algernon Huxley, MD;  Location: [place] CV LAB;  Service: Cardiovascular;  Laterality: N/A;   EYE SURGERY Right 2021   cataracts    PERIPHERAL VASCULAR THROMBECTOMY Right 04/03/2021   Procedure: A/V Fistulagram ;  Surgeon: Katha Cabal, MD;  Location: [place] CV LAB;  Service: Cardiovascular;  Laterality: Right;     reports that she quit smoking about 4 years ago. Her smoking use included  cigarettes. She has never used smokeless tobacco. She reports that she does not currently use alcohol. She reports that she does not currently use drugs.  Allergies  Allergen Reactions   Hydralazine Hives, Shortness Of Breath, Swelling and Rash    Body aches    Kiwi Extract Hives, Swelling and Rash    Mouth swells    Lisinopril Swelling    Angioedema   Shellfish Allergy Anaphylaxis    Shrimp/lobster  Betadine leaves welts on skin     Betadine [Povidone Iodine] Hives and Rash    LEAVES WELTS ON SKIN   Metformin Diarrhea and Other (See Comments)    GI Intolerance   Tape Itching    Use paper tape whenever possible    Family History  Problem Relation Age of Onset   CAD Mother    CAD Brother    Breast cancer Sister 41      Prior to Admission medications   Medication Sig Start Date End Date Taking? Authorizing Provider  acetaminophen (TYLENOL) 500 MG tablet Take 500-1,000 mg by mouth every 6 (six) hours as needed for mild pain or moderate pain.   Yes [provider]  albuterol (VENTOLIN HFA) 108 (90 Base) MCG/ACT inhaler Inhale 2 puffs into the lungs every 6 (six) hours as needed for wheezing or shortness of breath.   Yes [provider]  amLODipine (NORVASC) 10 MG tablet Take 1 tablet (10 mg total) by mouth daily. 03/03/20  Yes Lorella Nimrod, MD  aspirin 81 MG EC tablet Take 1 tablet (81 mg total) by mouth daily. 02/10/21  Yes Max Sane, MD  atorvastatin (LIPITOR) 80 MG tablet Take 1 tablet (80 mg total) by mouth daily at 6 PM. 11/26/19  Yes Lavina Hamman, MD  cloNIDine (CATAPRES) 0.1 MG tablet Take 0.1 mg by mouth 2 (two) times daily. (Non-dialysis days)   Yes [provider]  fenofibrate (TRICOR) 145 MG tablet Take 145 mg by mouth daily.   Yes [provider]  furosemide (LASIX) 40 MG tablet Take 40 mg by mouth daily.   Yes [provider]  insulin aspart protamine- aspart (NOVOLOG MIX 70/30) (70-30) 100 UNIT/ML injection Inject 0.15  mLs (15 Units total) into the skin 2 (two) times daily with a meal. 11/12/20  Yes Fritzi Mandes, MD  isosorbide mononitrate (IMDUR) 30 MG 24 hr tablet Take 30 mg by mouth daily.   Yes [provider]  loperamide (IMODIUM A-D) 2 MG tablet Take 4 mg by mouth 4 (four) times daily as needed for diarrhea or loose stools.   Yes [provider]  mometasone-formoterol (DULERA) 200-5 MCG/ACT AERO Inhale 2 puffs into the lungs 2 (two) times daily.   Yes [provider]  omeprazole (PRILOSEC) 40 MG capsule Take 40 mg by mouth daily. 09/22/20  Yes [provider]  polyethylene glycol (  MIRALAX / GLYCOLAX) 17 g packet Take 17 g by mouth daily. 02/11/21  Yes Lorella Nimrod, MD  potassium chloride (KLOR-CON) 10 MEQ tablet Take 10 mEq by mouth daily.   Yes [provider]  spironolactone (ALDACTONE) 25 MG tablet Take 1 tablet (25 mg total) by mouth daily. 06/16/21  Yes Alma Friendly, MD  topiramate (TOPAMAX) 25 MG tablet Take 25 mg by mouth daily. 08/19/20  Yes [provider]  traZODone (DESYREL) 50 MG tablet Take 50 mg by mouth at bedtime.   Yes [provider]  vitamin B-12 (CYANOCOBALAMIN) 1000 MCG tablet Take 1,000 mcg by mouth daily.   Yes [provider]  ammonium lactate (LAC-HYDRIN) 12 % lotion Apply 1 application topically daily.    [provider]  bismuth subsalicylate (PEPTO BISMOL) 262 MG/15ML suspension Take 30 mLs by mouth every 6 (six) hours as needed for indigestion or diarrhea or loose stools.    [provider]  carvedilol (COREG) 25 MG tablet Take 25 mg by mouth 2 (two) times daily.    [provider]  mirtazapine (REMERON) 30 MG tablet Take 1 tablet (30 mg total) by mouth at bedtime. Patient not taking: Reported on 09/05/2021 11/26/19   Lavina Hamman, MD    Physical Exam: Vitals:   09/05/21 1951 09/05/21 2030 09/05/21 2130  BP: (!) 178/80 (!) 181/87 (!) 176/82  Pulse: 78 (!) 111 72  Resp: 20  (!) 25 (!) 21  Temp: 100.2 F (37.9 C)    TempSrc: Oral    SpO2: 95% 93% 100%  Weight: 81.2 kg    Height: 5\' 6"  (1.676 m)       Vitals:   09/05/21 1951 09/05/21 2030 09/05/21 2130  BP: (!) 178/80 (!) 181/87 (!) 176/82  Pulse: 78 (!) 111 72  Resp: 20 (!) 25 (!) 21  Temp: 100.2 F (37.9 C)    TempSrc: Oral    SpO2: 95% 93% 100%  Weight: 81.2 kg    Height: 5\' 6"  (1.676 m)        Constitutional: chronically ill appearing and oriented x 3 . Not in any apparent distress HEENT:      Head: Normocephalic and atraumatic.         Eyes: PERLA, EOMI, Conjunctivae are normal. Sclera is non-icteric.       Mouth/Throat: Mucous membranes are moist.       Neck: Supple with no signs of meningismus. Cardiovascular: Regular rate and rhythm. No murmurs, gallops, or rubs. 2+ symmetrical distal pulses are present . No JVD.  2+LE edema Respiratory: Respiratory effort increased .Lungs sounds with bibasilar crackles, or rhonchi.  Gastrointestinal: Soft, non tender, and non distended with positive bowel sounds.  Genitourinary: No CVA tenderness. Musculoskeletal: Nontender with normal range of motion in all extremities. No cyanosis, or erythema of extremities. Neurologic:  Face is symmetric. Moving all extremities. No gross focal neurologic deficits . Skin: Skin is warm, dry.  No rash or ulcers Psychiatric: Mood and affect are normal    Labs on Admission: I have personally reviewed following labs and imaging studies  CBC: Recent Labs  Lab 09/05/21 1956  WBC 7.8  HGB 12.9  HCT 40.3  MCV 87.8  PLT 627   Basic Metabolic Panel: No results for input(s): NA, K, CL, CO2, GLUCOSE, BUN, CREATININE, CALCIUM, MG, PHOS in the last 168 hours. GFR: CrCl cannot be calculated (Patient's most recent lab result is older than the maximum 21 days allowed.). Liver Function Tests: No results  for input(s): AST, ALT, ALKPHOS, BILITOT, PROT, ALBUMIN in the last 168 hours. No results for input(s): LIPASE,  AMYLASE in the last 168 hours. No results for input(s): AMMONIA in the last 168 hours. Coagulation Profile: No results for input(s): INR, PROTIME in the last 168 hours. Cardiac Enzymes: No results for input(s): CKTOTAL, CKMB, CKMBINDEX, TROPONINI in the last 168 hours. BNP (last 3 results) No results for input(s): PROBNP in the last 8760 hours. HbA1C: No results for input(s): HGBA1C in the last 72 hours. CBG: No results for input(s): GLUCAP in the last 168 hours. Lipid Profile: No results for input(s): CHOL, HDL, LDLCALC, TRIG, CHOLHDL, LDLDIRECT in the last 72 hours. Thyroid Function Tests: No results for input(s): TSH, T4TOTAL, FREET4, T3FREE, THYROIDAB in the last 72 hours. Anemia Panel: No results for input(s): VITAMINB12, FOLATE, FERRITIN, TIBC, IRON, RETICCTPCT in the last 72 hours. Urine analysis:    Component Value Date/Time   COLORURINE STRAW (A) 10/21/2020 0829   APPEARANCEUR CLEAR (A) 10/21/2020 0829   LABSPEC 1.007 10/21/2020 0829   PHURINE 7.0 10/21/2020 0829   GLUCOSEU 150 (A) 10/21/2020 0829   HGBUR NEGATIVE 10/21/2020 0829   BILIRUBINUR NEGATIVE 10/21/2020 0829   KETONESUR NEGATIVE 10/21/2020 0829   PROTEINUR >=300 (A) 10/21/2020 0829   NITRITE NEGATIVE 10/21/2020 0829   LEUKOCYTESUR SMALL (A) 10/21/2020 0829    Radiological Exams on Admission: DG Chest 2 View  Result Date: 09/05/2021 CLINICAL DATA:  Chest pain and shortness of breath. EXAM: CHEST - 2 VIEW COMPARISON:  06/11/2021 FINDINGS: Postoperative changes in the mediastinum. Interval removal of central venous catheter. Shallow inspiration. Mild cardiac enlargement with mild central vascular congestion and mild perihilar interstitial pattern, likely early edema. Small left pleural effusion. No pneumothorax. Calcification of the aorta. IMPRESSION: Cardiac enlargement with mild vascular congestion, central edema, and small left pleural effusion. Similar appearance to previous study. Electronically Signed   By:  Lucienne Capers M.D.   On: 09/05/2021 20:22     Assessment/Plan 60 year old female with history of chronic combined heart failure, last EF 06/13/21 35 to 40%, CAD s/p CABG, DM, ESRD on HD MWF, COPD, presenting with 3 days of shortness of breath, chest pain, dry cough, fevers and chills.     Possible Sepsis ([place]) -Patient with borderline sepsis criteria to include temperature 100.2, tachycardia of 111, tachypnea of 25 mild hypoxia of 93.  WBC normal but lactic acid 2 which could be related to increased work of breathing from respiratory tract infection - Given soft sepsis criteria and COVID infection and suspected acute CHF with hypertensive urgency/emergency and prior short dialysis session, prudent to hold off on IV hydration but with continued close monitoring for more robust sepsis criteria    Pneumonia/respiratory infection due to COVID-19 virus - COVID PCR positive but normal WBC.  Symptomatic for fever, cough and tachypnea - Chest x-ray without definite COVID appearance - Remdesivir, albuterol, antitussives, vitamins - Airborne precautions -Follow procalcitonin to evaluate for bacterial superinfection   Elevated troponin, atypical chest pain CAD with history of CABG - Slightly elevated troponin of 26, suspect demand from acute illness.  EKG nonacute - Continue to trend - Continue aspirin, atorvastatin and carvedilol as well as Imdur - Nitroglycerin as needed chest pain with morphine for breakthrough    Acute on chronic combined systolic and diastolic CHF   Hypertensive urgency/emergency - BP 181/87 with vascular congestion on chest x-ray - IV Lasix - Should improve with dialysis to urgent dialysis not needed - Daily weights with intake and  output monitoring - Last echo June 2022 with EF 35 to 40% and grade 2 diastolic dysfunction    DM2 (diabetes mellitus, type 2) (HCC) - Sliding scale insulin coverage    ESRD on dialysis [facility]) - Nephrology consult for continuation of  dialysis    COPD (chronic obstructive pulmonary disease) with emphysema ([place]) - Not acutely exacerbated - DuoNebs as needed   DVT prophylaxis: Lovenox  Code Status: full code  Family Communication:  none  Disposition Plan: Back to previous home environment Consults called: renal  Status:At the time of admission, it appears that the appropriate admission status for this patient is INPATIENT. This is judged to be reasonable and necessary in order to provide the required intensity of service to ensure the patient's safety given the presenting symptoms, physical exam findings, and initial radiographic and laboratory data in the context of their  Comorbid conditions.   Patient requires inpatient status due to high intensity of service, high risk for further deterioration and high frequency of surveillance required.   I certify that at the point of admission it is my clinical judgment that the patient will require inpatient hospital care spanning beyond [place] MD [facility]     09/05/2021, 10:06 PM

## 2021-09-05 NOTE — ED Notes (Signed)
Pt placed on 1 L [place] for comfort.  Pt sats 98-100% on RA

## 2021-09-06 DIAGNOSIS — N186 End stage renal disease: Secondary | ICD-10-CM

## 2021-09-06 DIAGNOSIS — E785 Hyperlipidemia, unspecified: Secondary | ICD-10-CM

## 2021-09-06 DIAGNOSIS — R778 Other specified abnormalities of plasma proteins: Secondary | ICD-10-CM

## 2021-09-06 DIAGNOSIS — E1169 Type 2 diabetes mellitus with other specified complication: Secondary | ICD-10-CM

## 2021-09-06 DIAGNOSIS — I5023 Acute on chronic systolic (congestive) heart failure: Secondary | ICD-10-CM

## 2021-09-06 DIAGNOSIS — I10 Essential (primary) hypertension: Secondary | ICD-10-CM

## 2021-09-06 DIAGNOSIS — U071 COVID-19: Principal | ICD-10-CM

## 2021-09-06 LAB — CBG MONITORING, ED
Glucose-Capillary: 211 mg/dL — ABNORMAL HIGH (ref 70–99)
Glucose-Capillary: 285 mg/dL — ABNORMAL HIGH (ref 70–99)
Glucose-Capillary: 153 mg/dL — ABNORMAL HIGH (ref 70–99)
Glucose-Capillary: 165 mg/dL — ABNORMAL HIGH (ref 70–99)

## 2021-09-06 LAB — PROCALCITONIN: Procalcitonin: 0.23 ng/mL

## 2021-09-06 LAB — BASIC METABOLIC PANEL
Anion gap: 13 (ref 5–15)
BUN: 36 mg/dL — ABNORMAL HIGH (ref 6–20)
CO2: 24 mmol/L (ref 22–32)
Calcium: 8.6 mg/dL — ABNORMAL LOW (ref 8.9–10.3)
Chloride: 100 mmol/L (ref 98–111)
Creatinine, Ser: 5.78 mg/dL — ABNORMAL HIGH (ref 0.44–1.00)
GFR, Estimated: 8 mL/min — ABNORMAL LOW (ref >60)
Glucose, Bld: 231 mg/dL — ABNORMAL HIGH (ref 70–99)
Potassium: 4 mmol/L (ref 3.5–5.1)
Sodium: 137 mmol/L (ref 135–145)

## 2021-09-06 LAB — C-REACTIVE PROTEIN: CRP: 7.1 mg/dL — ABNORMAL HIGH (ref <1.0)

## 2021-09-06 LAB — HEMOGLOBIN A1C
Hgb A1c MFr Bld: 8 % — ABNORMAL HIGH (ref 4.8–5.6)
Mean Plasma Glucose: 182.9 mg/dL

## 2021-09-06 MED ORDER — INSULIN GLARGINE-YFGN 100 UNIT/ML ~~LOC~~ SOLN
7.0000 [IU] | Freq: Every day | SUBCUTANEOUS | Status: DC
  Administered 2021-09-06: 7 [IU] via SUBCUTANEOUS
  Filled 2021-09-06 (×3): qty 0.07

## 2021-09-06 NOTE — Progress Notes (Signed)
Patient ID: Terry Arroyo, female   DOB: 1961-07-02, 60 y.o.   MRN: [id number] [facility] PROGRESS NOTE  Terry Arroyo RWE:[id number] DOB: June 16, 1961 DOA: 09/05/2021 PCP: Kirk Ruths, MD  HPI/Subjective: Patient coming in with shortness of breath and unable to breathe.  She gets dialysis on Monday Wednesday Friday and had her last dialysis on Friday.  Patient having a dry cough.  Patient had a low-grade temperature 100.2 in the emergency room.  Patient was found to be COVID-positive.  Objective: Vitals:   09/06/21 1000 09/06/21 1100  BP: (!) 177/86 (!) 152/81  Pulse: 73 74  Resp: 16 19  Temp:    SpO2: 93% 95%    Intake/Output Summary (Last 24 hours) at 09/06/2021 1159 Last data filed at 09/06/2021 0105 Gross per 24 hour  Intake 200 ml  Output --  Net 200 ml   Filed Weights   09/05/21 1951  Weight: 81.2 kg    ROS: Review of Systems  Respiratory:  Positive for cough and shortness of breath.   Cardiovascular:  Negative for chest pain.  Gastrointestinal:  Negative for abdominal pain, nausea and vomiting.  Exam: Physical Exam HENT:     Head: Normocephalic.     Mouth/Throat:     Pharynx: No oropharyngeal exudate.  Eyes:     General: Lids are normal.     Conjunctiva/sclera: Conjunctivae normal.  Cardiovascular:     Rate and Rhythm: Normal rate and regular rhythm.     Heart sounds: Normal heart sounds, S1 normal and S2 normal.  Pulmonary:     Breath sounds: Examination of the right-lower field reveals decreased breath sounds. Examination of the left-lower field reveals decreased breath sounds. Decreased breath sounds present. No wheezing, rhonchi or rales.  Abdominal:     Palpations: Abdomen is soft.     Tenderness: There is no abdominal tenderness.  Musculoskeletal:     Right lower leg: No swelling.     Left lower leg: No swelling.  Skin:    General: Skin is warm.     Findings: No rash.  Neurological:     Mental Status: She is alert and oriented to  person, place, and time.      Scheduled Meds:  albuterol  2.5 mg Inhalation Q6H   amLODipine  10 mg Oral Daily   vitamin C  500 mg Oral Daily   aspirin EC  81 mg Oral Daily   atorvastatin  80 mg Oral q1800   carvedilol  25 mg Oral BID   cloNIDine  0.1 mg Oral BID   dexamethasone (DECADRON) injection  6 mg Intravenous Q24H   fenofibrate  160 mg Oral Daily   furosemide  40 mg Intravenous Q12H   heparin  5,000 Units Subcutaneous Q8H   insulin aspart  0-15 Units Subcutaneous TID WC   insulin aspart  0-5 Units Subcutaneous QHS   isosorbide mononitrate  30 mg Oral Daily   pantoprazole  40 mg Oral Daily   spironolactone  25 mg Oral Daily   topiramate  25 mg Oral Daily   traZODone  50 mg Oral QHS   zinc sulfate  220 mg Oral Daily   Continuous Infusions:  remdesivir 100 mg in NS 100 mL 100 mg (09/06/21 1108)    Assessment/Plan:  COVID-19 infection.  Chest x-ray read as no pneumonia.  Patient started on remdesivir (day 2) and Decadron.  Patient had low-grade fever, and tachycardia.  Sepsis ruled out.  Patient on room air. Acute on chronic  systolic congestive heart failure last EF 35 to 40% patient ordered IV Lasix by admitting physician.  Dialysis to manage fluid.  Patient on Coreg, spironolactone.  Has allergy to ACE inhibitor. Lactic acidosis likely from COVID-19 infection Elevated troponin likely false positive from end-stage renal disease. End-stage renal disease.  Nephrology to keep on schedule Monday Wednesday Friday for dialysis Type 2 diabetes mellitus with hyperlipidemia on atorvastatin and sliding scale insulin.  Add low-dose Lantus. Essential hypertension on numerous medications including amlodipine Coreg clonidine Imdur and spironolactone   Code Status:     Code Status Orders  (From admission, onward)           Start     Ordered   09/05/21 2305  Full code  Continuous        09/05/21 2306           Code Status History     Date Active Date Inactive Code  Status Order ID Comments User Context   06/11/2021 1133 06/16/2021 0044 Full Code [id number]  [facility], Briant Cedar, DO ED   03/20/2021 1715 03/22/2021 2008 Full Code [id number]  Clarnce Flock, MD Inpatient   02/09/2021 1614 02/11/2021 2052 Full Code [id number]  Collier Bullock, MD ED   11/01/2020 2136 11/12/2020 2358 Full Code [id number]  Para Skeans, MD ED   10/22/2020 0016 10/28/2020 2317 Full Code [id number]  Lenore Cordia, MD ED   10/10/2020 2233 10/12/2020 2036 Full Code [id number]  Mansy, Arvella Merles, MD ED   09/12/2020 1701 09/15/2020 1902 Full Code [id number]  Wyvonnia Dusky, MD ED   02/26/2020 1946 03/02/2020 2055 Full Code [id number]  Toy Baker, MD ED   01/22/2020 0208 01/23/2020 2047 Full Code [id number]  Mansy, Arvella Merles, MD ED   11/23/2019 1827 11/26/2019 1613 Full Code [id number]  Lavina Hamman, MD ED      Family Communication: spoke with father on the phone Disposition Plan: Status is: Inpatient  Dispo: The patient is from: Home              Anticipated d/c is to: Home              Patient currently diagnosed with COVID-19 infection and also CHF   Difficult to place patient. No.  Antibiotics: Remdesivir  Time spent: 28 minutes  [place]

## 2021-09-06 NOTE — Progress Notes (Addendum)
[facility]  ROUNDING NOTE   Subjective:  Terry Arroyo is a 60 years old female with past medical history of Chronic combined heart failure, last EF 06/13/21 35 to 40%, CAD s/p CABG, DM, ESRD on HD MWF, COPD, who presented to the ED with a complaint of shortness of breath and chest pain that started after dialysis treatment on 9/16.  She also has a dry cough, fevers and chills.  She got tested positive for Covid-19.  Patient resting in bed,dyspnea noted on mild exertion. SpO2 100% on room air.    Objective:  Vital signs in last 24 hours:  Temp:  [98.6 F (37 C)-100.2 F (37.9 C)] 98.6 F (37 C) (09/18 0816) Pulse Rate:  [56-111] 75 (09/18 0930) Resp:  [14-26] 14 (09/18 0930) BP: (131-181)/(63-87) 172/85 (09/18 0930) SpO2:  [91 %-100 %] 94 % (09/18 0930) Weight:  [81.2 kg] 81.2 kg (09/17 1951)  Weight change:  Filed Weights   09/05/21 1951  Weight: 81.2 kg    Intake/Output: I/O last 3 completed shifts: In: 200 [IV Piggyback:200] Out: -    Intake/Output this shift:  No intake/output data recorded.  Physical Exam: General: No acute distress, appears slightly anxious  Head: Normocephalic, atraumatic. Moist oral mucosal membranes  Eyes: Anicteric  Lungs:  Crackles at the bases bilaterally, respirations labored with minimal exertion such as talking  Heart: Regular rate and rhythm  Abdomen:  Soft, nontender, non distended  Extremities:  No peripheral edema.  Neurologic: Nonfocal, moving all four extremities  Skin: No acute rashes or lesions  Access: AVF Rt UpperArm, +Bruit,+Thrill    Basic Metabolic Panel: Recent Labs  Lab 09/06/21 0734  NA 137  K 4.0  CL 100  CO2 24  GLUCOSE 231*  BUN 36*  CREATININE 5.78*  CALCIUM 8.6*    Liver Function Tests: No results for input(s): AST, ALT, ALKPHOS, BILITOT, PROT, ALBUMIN in the last 168 hours. No results for input(s): LIPASE, AMYLASE in the last 168 hours. No results for input(s): AMMONIA in the last 168  hours.  CBC: Recent Labs  Lab 09/05/21 1956  WBC 7.8  HGB 12.9  HCT 40.3  MCV 87.8  PLT 241    Cardiac Enzymes: No results for input(s): CKTOTAL, CKMB, CKMBINDEX, TROPONINI in the last 168 hours.  BNP: Invalid input(s): POCBNP  CBG: Recent Labs  Lab 09/05/21 2357 09/06/21 0815  GLUCAP 209* 211*    Microbiology: Results for orders placed or performed during the hospital encounter of 09/05/21  Resp Panel by RT-PCR (Flu A&B, Covid) Nasopharyngeal Swab     Status: Abnormal   Collection Time: 09/05/21  8:18 PM   Specimen: Nasopharyngeal Swab; Nasopharyngeal(NP) swabs in vial transport medium  Result Value Ref Range Status   SARS Coronavirus 2 by RT PCR POSITIVE (A) NEGATIVE Final    Comment: RESULT CALLED TO, READ BACK BY AND VERIFIED WITH: CANDANCE NUCKLES @ 2120 09/05/21 LFD (NOTE) SARS-CoV-2 target nucleic acids are DETECTED.  The SARS-CoV-2 RNA is generally detectable in upper respiratory specimens during the acute phase of infection. Positive results are indicative of the presence of the identified virus, but do not rule out bacterial infection or co-infection with other pathogens not detected by the test. Clinical correlation with patient history and other diagnostic information is necessary to determine patient infection status. The expected result is Negative.  Fact Sheet for Patients: EntrepreneurPulse.com.au  Fact Sheet for Healthcare Providers: IncredibleEmployment.be  This test is not yet approved or cleared by the [place] FDA  and  has been authorized for detection and/or diagnosis of SARS-CoV-2 by FDA under an Emergency Use Authorization (EUA).  This EUA will remain in effect (meaning this test can  be used) for the duration of  the COVID-19 declaration under Section 564(b)(1) of the Act, 21 U.S.C. section 360bbb-3(b)(1), unless the authorization is terminated or revoked sooner.     Influenza A by PCR  NEGATIVE NEGATIVE Final   Influenza B by PCR NEGATIVE NEGATIVE Final    Comment: (NOTE) The Xpert Xpress SARS-CoV-2/FLU/RSV plus assay is intended as an aid in the diagnosis of influenza from Nasopharyngeal swab specimens and should not be used as a sole basis for treatment. Nasal washings and aspirates are unacceptable for Xpert Xpress SARS-CoV-2/FLU/RSV testing.  Fact Sheet for Patients: EntrepreneurPulse.com.au  Fact Sheet for Healthcare Providers: IncredibleEmployment.be  This test is not yet approved or cleared by the [place] FDA and has been authorized for detection and/or diagnosis of SARS-CoV-2 by FDA under an Emergency Use Authorization (EUA). This EUA will remain in effect (meaning this test can be used) for the duration of the COVID-19 declaration under Section 564(b)(1) of the Act, 21 U.S.C. section 360bbb-3(b)(1), unless the authorization is terminated or revoked.  Performed at [facility], [address] [place], [place] [address]   Blood Culture (routine x 2)     Status: None (Preliminary result)   Collection Time: 09/05/21  8:18 PM   Specimen: BLOOD  Result Value Ref Range Status   Specimen Description BLOOD BLOOD LEFT HAND  Final   Special Requests   Final    BOTTLES DRAWN AEROBIC AND ANAEROBIC Blood Culture results may not be optimal due to an inadequate volume of blood received in culture bottles   Culture   Final    NO GROWTH < 12 HOURS Performed at [facility], [address] [place],  [address]    Report Status PENDING  Incomplete    Coagulation Studies: No results for input(s): LABPROT, INR in the last 72 hours.  Urinalysis: No results for input(s): COLORURINE, LABSPEC, PHURINE, GLUCOSEU, HGBUR, BILIRUBINUR, KETONESUR, PROTEINUR, UROBILINOGEN, NITRITE, LEUKOCYTESUR in the last 72 hours.  Invalid input(s): APPERANCEUR    Imaging: DG Chest 2 View  Result Date:  09/05/2021 CLINICAL DATA:  Chest pain and shortness of breath. EXAM: CHEST - 2 VIEW COMPARISON:  06/11/2021 FINDINGS: Postoperative changes in the mediastinum. Interval removal of central venous catheter. Shallow inspiration. Mild cardiac enlargement with mild central vascular congestion and mild perihilar interstitial pattern, likely early edema. Small left pleural effusion. No pneumothorax. Calcification of the aorta. IMPRESSION: Cardiac enlargement with mild vascular congestion, central edema, and small left pleural effusion. Similar appearance to previous study. Electronically Signed   By: Lucienne Capers M.D.   On: 09/05/2021 20:22     Medications:    remdesivir 100 mg in NS 100 mL      albuterol  2.5 mg Inhalation Q6H   amLODipine  10 mg Oral Daily   vitamin C  500 mg Oral Daily   aspirin EC  81 mg Oral Daily   atorvastatin  80 mg Oral q1800   carvedilol  25 mg Oral BID   cloNIDine  0.1 mg Oral BID   dexamethasone (DECADRON) injection  6 mg Intravenous Q24H   fenofibrate  160 mg Oral Daily   furosemide  40 mg Intravenous Q12H   heparin  5,000 Units Subcutaneous Q8H   insulin aspart  0-15 Units Subcutaneous TID WC   insulin aspart  0-5 Units Subcutaneous QHS   isosorbide mononitrate  30 mg Oral Daily   pantoprazole  40 mg Oral Daily   spironolactone  25 mg Oral Daily   topiramate  25 mg Oral Daily   traZODone  50 mg Oral QHS   zinc sulfate  220 mg Oral Daily   acetaminophen **OR** acetaminophen, chlorpheniramine-HYDROcodone, fentaNYL (SUBLIMAZE) injection, guaiFENesin-dextromethorphan, ondansetron **OR** ondansetron (ZOFRAN) IV  Assessment/ Plan:  Terry Arroyo is a 60 y.o.  female with past medical history of Chronic combined heart failure, last EF 06/13/21 35 to 40%, CAD s/p CABG, DM, ESRD on HD MWF, COPD, who presented to the ED with a complaint of shortness of breath and chest pain that started after dialysis treatment on 9/16.  She also has a dry cough, fevers and  chills.  She got tested positive for Covid.  #ESRD on HD MWF at [place] dialysis treatment was on 09/04/2021 Patient reported SOB and Chest pain started after HD treatment Volume and electrolyte status acceptable Will plan for dialysis MWF schedule  #Hypertension Blood Pressure was 181/87 on ED arrival, down to 152/65 this morning Continue current antihypertensive regimen  #Secondary Hyperparathyroidism Lab Results  Component Value Date   CALCIUM 8.6 (L) 09/06/2021   CAION 1.09 (L) 05/13/2021   PHOS 3.4 11/12/2020   Will continue monitoring bone mineral metabolism parameters  #Diabetes with CKD Lab Results  Component Value Date   HGBA1C 9.6 (H) 06/11/2021   Patient is on Insulin Aspart, diabetic management per primary team.      LOS: 1 Cherlynn Popiel 9/18/202210:15 AM

## 2021-09-06 NOTE — ED Notes (Signed)
Pt c/o back pain. Transferred to hospital bed.

## 2021-09-06 NOTE — Evaluation (Signed)
Physical Therapy Evaluation Patient Details Name: Terry Arroyo MRN: [id number] DOB: 01-Jun-1961 Today's Date: 09/06/2021  History of Present Illness  Pt is a 60 y.o. female with medical history significant for chronic combined heart failure, last EF 06/13/21 35 to 40%, CAD s/p CABG, DM, ESRD on HD MWF, COPD, who presents to the ED with a complaint of shortness of breath and chest pain that started after dialysis on 9/16. MD assessment includes Covid-19 infection, acute on chronic systolic congestive heart failure, and increased troponis likely due to demand ischemia.   Clinical Impression  Pt was pleasant and motivated to participate during the session and overall performed well during the session.  Pt required no physical assistance during the session and reported no adverse symptoms.  Pt was able to amb multiple laps around her ER room with a RW with slow cadence but was steady without LOB with SpO2 and HR WNL on room air. Pt reports feeling weaker than at baseline and will benefit from HHPT upon discharge to safely address deficits listed in patient problem list for decreased caregiver assistance and eventual return to PLOF.         Recommendations for follow up therapy are one component of a multi-disciplinary discharge planning process, led by the attending physician.  Recommendations may be updated based on patient status, additional functional criteria and insurance authorization.  Follow Up Recommendations Home health PT;Supervision - Intermittent    Equipment Recommendations  Rolling walker with 5" wheels    Recommendations for Other Services       Precautions / Restrictions Precautions Precautions: Fall Restrictions Weight Bearing Restrictions: No      Mobility  Bed Mobility Overal bed mobility: Independent                  Transfers Overall transfer level: Needs assistance Equipment used: Rolling walker (2 wheeled) Transfers: Sit to/from Stand Sit to Stand:  Supervision         General transfer comment: Good eccentric and concentric control and stability; able to stand and sit without UE assist  Ambulation/Gait Ambulation/Gait assistance: Supervision Gait Distance (Feet): 60 Feet Assistive device: Rolling walker (2 wheeled) Gait Pattern/deviations: Step-through pattern Gait velocity: decreased   General Gait Details: Pt able to ambulate 60+ feet limited to laps in room secondary to Covid-19 infection.  No adverse symptoms noted with SpO2 and HR WNL on room air.  Stairs            Wheelchair Mobility    Modified Rankin (Stroke Patients Only)       Balance Overall balance assessment: Needs assistance   Sitting balance-Leahy Scale: Normal     Standing balance support: Bilateral upper extremity supported;During functional activity Standing balance-Leahy Scale: Good                               Pertinent Vitals/Pain Pain Assessment: No/denies pain    Home Living Family/patient expects to be discharged to:: Private residence Living Arrangements: Alone Available Help at Discharge: Family;Available PRN/intermittently Type of Home: Apartment Home Access: Level entry;Elevator     Home Layout: One level Home Equipment: Cane - single point;Shower seat Additional Comments: Pt owned a rollator but is broken per patient    Prior Function Level of Independence: Independent with assistive device(s)         Comments: Used rollator for household ambulation until recently when the handle broke but also endorses use of SPC "when  needed"/community distances. She uses public transport for dialysis. Uses shower chair for bathing.  Denies fall history.  No home O2.     Hand Dominance   Dominant Hand: Left    Extremity/Trunk Assessment   Upper Extremity Assessment Upper Extremity Assessment: Generalized weakness    Lower Extremity Assessment Lower Extremity Assessment: Generalized weakness        Communication   Communication: No difficulties  Cognition Arousal/Alertness: Awake/alert Behavior During Therapy: WFL for tasks assessed/performed Overall Cognitive Status: Within Functional Limits for tasks assessed                                        General Comments      Exercises Total Joint Exercises Ankle Circles/Pumps: AROM;Strengthening;Both;10 reps Quad Sets: 10 reps;Both;Strengthening Gluteal Sets: Strengthening;Both;10 reps Heel Slides: Strengthening;Both;5 reps Hip ABduction/ADduction: Strengthening;Both;10 reps Long Arc Quad: 10 reps;Both;Strengthening Marching in Standing: AROM;Strengthening;Both;5 reps;Standing Other Exercises Other Exercises: HEP education for BLE APs, QS, GS, LAQs, and hip abd/add x 10 each 3-4x/day   Assessment/Plan    PT Assessment Patient needs continued PT services  PT Problem List Decreased strength;Decreased activity tolerance;Decreased balance;Decreased mobility;Decreased knowledge of use of DME       PT Treatment Interventions DME instruction;Gait training;Functional mobility training;Therapeutic activities;Therapeutic exercise;Balance training;Patient/family education    PT Goals (Current goals can be found in the Care Plan section)  Acute Rehab PT Goals Patient Stated Goal: To get stronger PT Goal Formulation: With patient Time For Goal Achievement: 09/19/21 Potential to Achieve Goals: Good    Frequency Min 2X/week   Barriers to discharge        Co-evaluation               AM-PAC PT "6 Clicks" Mobility  Outcome Measure Help needed turning from your back to your side while in a flat bed without using bedrails?: None Help needed moving from lying on your back to sitting on the side of a flat bed without using bedrails?: None Help needed moving to and from a bed to a chair (including a wheelchair)?: A Little Help needed standing up from a chair using your arms (e.g., wheelchair or bedside chair)?:  A Little Help needed to walk in hospital room?: A Little Help needed climbing 3-5 steps with a railing? : A Little 6 Click Score: 20    End of Session Equipment Utilized During Treatment: Gait belt Activity Tolerance: Patient tolerated treatment well Patient left: in bed;with bed alarm set;with call bell/phone within reach Nurse Communication: Mobility status PT Visit Diagnosis: Difficulty in walking, not elsewhere classified (R26.2);Muscle weakness (generalized) (M62.81)    Time: 9449-6759 PT Time Calculation (min) (ACUTE ONLY): 48 min   Charges:   PT Evaluation $PT Eval Moderate Complexity: 1 Mod PT Treatments $Therapeutic Exercise: 8-22 mins $Therapeutic Activity: 8-22 mins       D. Royetta Asal PT, DPT 09/06/21, 4:00 PM

## 2021-09-06 NOTE — ED Notes (Signed)
Provided pt with remote

## 2021-09-07 DIAGNOSIS — E872 Acidosis, unspecified: Secondary | ICD-10-CM | POA: Insufficient documentation

## 2021-09-07 DIAGNOSIS — E1142 Type 2 diabetes mellitus with diabetic polyneuropathy: Secondary | ICD-10-CM | POA: Insufficient documentation

## 2021-09-07 LAB — BASIC METABOLIC PANEL
Anion gap: 13 (ref 5–15)
BUN: 66 mg/dL — ABNORMAL HIGH (ref 6–20)
CO2: 23 mmol/L (ref 22–32)
Calcium: 8 mg/dL — ABNORMAL LOW (ref 8.9–10.3)
Chloride: 97 mmol/L — ABNORMAL LOW (ref 98–111)
Creatinine, Ser: 7.22 mg/dL — ABNORMAL HIGH (ref 0.44–1.00)
GFR, Estimated: 6 mL/min — ABNORMAL LOW (ref >60)
Glucose, Bld: 235 mg/dL — ABNORMAL HIGH (ref 70–99)
Potassium: 4.8 mmol/L (ref 3.5–5.1)
Sodium: 133 mmol/L — ABNORMAL LOW (ref 135–145)

## 2021-09-07 LAB — LACTIC ACID, PLASMA: Lactic Acid, Venous: 1.1 mmol/L (ref 0.5–1.9)

## 2021-09-07 LAB — CBG MONITORING, ED
Glucose-Capillary: 270 mg/dL — ABNORMAL HIGH (ref 70–99)
Glucose-Capillary: 157 mg/dL — ABNORMAL HIGH (ref 70–99)
Glucose-Capillary: 226 mg/dL — ABNORMAL HIGH (ref 70–99)

## 2021-09-07 LAB — HEPATITIS B SURFACE ANTIBODY,QUALITATIVE: Hep B S Ab: REACTIVE — AB

## 2021-09-07 LAB — HEPATITIS B SURFACE ANTIGEN: Hepatitis B Surface Ag: NONREACTIVE

## 2021-09-07 LAB — TROPONIN I (HIGH SENSITIVITY): Troponin I (High Sensitivity): 23 ng/L — ABNORMAL HIGH (ref <18)

## 2021-09-07 MED ORDER — INSULIN GLARGINE-YFGN 100 UNIT/ML ~~LOC~~ SOLN
15.0000 [IU] | Freq: Every day | SUBCUTANEOUS | Status: DC
  Administered 2021-09-07: 15 [IU] via SUBCUTANEOUS
  Filled 2021-09-07 (×2): qty 0.15

## 2021-09-07 MED ORDER — PENTAFLUOROPROP-TETRAFLUOROETH EX AERO
INHALATION_SPRAY | CUTANEOUS | Status: AC
  Filled 2021-09-07: qty 30

## 2021-09-07 MED ORDER — ALBUTEROL SULFATE HFA 108 (90 BASE) MCG/ACT IN AERS
2.0000 | INHALATION_SPRAY | Freq: Four times a day (QID) | RESPIRATORY_TRACT | Status: DC
  Administered 2021-09-08 – 2021-09-09 (×7): 2 via RESPIRATORY_TRACT
  Filled 2021-09-07: qty 6.7

## 2021-09-07 NOTE — Progress Notes (Signed)
Patient ID: Terry Arroyo, female   DOB: [birth year], 60 y.o.   MRN: [id number] [facility] PROGRESS NOTE  Terry Arroyo:[id number] DOB: Aug 22, 1961 DOA: 09/05/2021 PCP: Kirk Ruths, MD  HPI/Subjective: Patient was admitted with shortness of breath and unable to breathe.  She is feeling better now.  She was found to be COVID-positive and also mild fluid overload.  Receiving dialysis when I saw her today.  Complains of some numbness on the bottom of her feet.  Objective: Vitals:   09/07/21 1245 09/07/21 1300  BP: 139/67 (!) 141/65  Pulse: 60 60  Resp: 17 14  Temp:    SpO2: 94% 96%   No intake or output data in the 24 hours ending 09/07/21 1326 Filed Weights   09/05/21 1951  Weight: 81.2 kg    ROS: Review of Systems  Respiratory:  Positive for shortness of breath. Negative for cough.   Cardiovascular:  Negative for chest pain.  Gastrointestinal:  Negative for abdominal pain, nausea and vomiting.  Exam: Physical Exam HENT:     Head: Normocephalic.     Mouth/Throat:     Pharynx: No oropharyngeal exudate.  Eyes:     General: Lids are normal.     Conjunctiva/sclera: Conjunctivae normal.  Cardiovascular:     Rate and Rhythm: Normal rate and regular rhythm.     Heart sounds: Normal heart sounds, S1 normal and S2 normal.  Pulmonary:     Breath sounds: Examination of the right-lower field reveals decreased breath sounds. Examination of the left-lower field reveals decreased breath sounds. Decreased breath sounds present. No wheezing, rhonchi or rales.  Abdominal:     Palpations: Abdomen is soft.     Tenderness: There is no abdominal tenderness.  Musculoskeletal:     Right lower leg: No swelling.     Left lower leg: No swelling.  Skin:    General: Skin is warm.     Findings: No rash.  Neurological:     Mental Status: She is alert and oriented to person, place, and time.      Scheduled Meds:  pentafluoroprop-tetrafluoroeth       albuterol  2.5 mg  Inhalation Q6H   amLODipine  10 mg Oral Daily   vitamin C  500 mg Oral Daily   aspirin EC  81 mg Oral Daily   atorvastatin  80 mg Oral q1800   carvedilol  25 mg Oral BID   cloNIDine  0.1 mg Oral BID   dexamethasone (DECADRON) injection  6 mg Intravenous Q24H   fenofibrate  160 mg Oral Daily   furosemide  40 mg Intravenous Q12H   heparin  5,000 Units Subcutaneous Q8H   insulin aspart  0-15 Units Subcutaneous TID WC   insulin aspart  0-5 Units Subcutaneous QHS   insulin glargine-yfgn  15 Units Subcutaneous QHS   isosorbide mononitrate  30 mg Oral Daily   pantoprazole  40 mg Oral Daily   spironolactone  25 mg Oral Daily   topiramate  25 mg Oral Daily   traZODone  50 mg Oral QHS   zinc sulfate  220 mg Oral Daily   Continuous Infusions:  remdesivir 100 mg in NS 100 mL Stopped (09/07/21 0952)    Assessment/Plan:  COVID-19 infection.  Chest x-ray read as no pneumonia.  Continue remdesivir and Decadron.  Sepsis ruled out.  Patient on room air.  Likely discharge home tomorrow. Acute on chronic systolic congestive heart failure.  Last EF 35 to 40%.  Patient on  IV Lasix.  Dialysis also to manage fluid.  Continue Coreg, spironolactone. Lactic acidosis secondary to COVID infection Elevated troponin likely false-positive from end-stage renal disease End-stage renal disease on dialysis Monday Wednesday Friday.  Nephrology to check with dialysis coordinator about outpatient dialysis with patient being COVID-positive. Type 2 diabetes mellitus with hyperlipidemia on atorvastatin.  Continue low-dose Lantus while here in the hospital Essential hypertension on amlodipine, Coreg, clonidine, Imdur and spironolactone.  Blood pressure better controlled today. Diabetic neuropathy bilateral feet    Code Status:     Code Status Orders  (From admission, onward)           Start     Ordered   09/05/21 2305  Full code  Continuous        09/05/21 2306           Code Status History     Date  Active Date Inactive Code Status Order ID Comments User Context   06/11/2021 1133 06/16/2021 0044 Full Code [id number]  [facility], Briant Cedar, DO ED   03/20/2021 1715 03/22/2021 2008 Full Code [id number]  Clarnce Flock, MD Inpatient   02/09/2021 1614 02/11/2021 2052 Full Code [id number]  Collier Bullock, MD ED   11/01/2020 2136 11/12/2020 2358 Full Code [id number]  Para Skeans, MD ED   10/22/2020 0016 10/28/2020 2317 Full Code [id number]  Lenore Cordia, MD ED   10/10/2020 2233 10/12/2020 2036 Full Code [id number]  Mansy, Arvella Merles, MD ED   09/12/2020 1701 09/15/2020 1902 Full Code [id number]  Wyvonnia Dusky, MD ED   02/26/2020 1946 03/02/2020 2055 Full Code [id number]  Toy Baker, MD ED   01/22/2020 0208 01/23/2020 2047 Full Code [id number]  Mansy, Arvella Merles, MD ED   11/23/2019 1827 11/26/2019 1613 Full Code [id number]  Lavina Hamman, MD ED      Family Communication: Spoke with father on the phone Disposition Plan: Status is: Inpatient  Dispo: The patient is from: home              Anticipated d/c is to: home tomorrow              Patient currently doing better than admission.  Likely discharge tomorrow morning.  Nephrology to coordinate outpatient dialysis with patient being COVID-positive.   Difficult to place patient.  No  Time spent: 27 minutes  [place]

## 2021-09-07 NOTE — TOC Initial Note (Addendum)
Transition of Care [facility]) - Initial/Assessment Note    Patient Details  Name: Terry Arroyo MRN: [id number] Date of Birth: 1961/01/15  Transition of Care [facility]) CM/SW Contact:    Anselm Pancoast, RN Phone Number: 09/07/2021, 2:15 PM  Clinical Narrative:                 Spoke with Amanda-Dialysis Navigator who confirmed patient is in need of transportation assistance to and from dialysis on Mon,Wed, Friday to [facility] due to [name] unable to transport COVID patients. RN CM will outreach to [facility] transport for possible assistance until [name] able to resume transport.   Secure email sent to [facility] transport requesting assistance with dialysis transport until COVID quarantine is over.        Patient Goals and CMS Choice        Expected Discharge Plan and Services                                                Prior Living Arrangements/Services                       Activities of Daily Living      Permission Sought/Granted                  Emotional Assessment              Admission diagnosis:  Acute CHF (congestive heart failure) ([place]) [I50.9] Patient Active Problem List   Diagnosis Date Noted   Lactic acidosis    Diabetic polyneuropathy associated with type 2 diabetes mellitus ([place])    Sepsis ([place]) 09/05/2021   Acute CHF (congestive heart failure) ([place]) 09/05/2021   Fluid overload 06/11/2021   Hypokalemia 06/11/2021   Acute pulmonary edema (HCC)    COPD (chronic obstructive pulmonary disease) with emphysema ([place]) 02/09/2021   Chronic sphenoidal sinusitis 01/01/2021   Contact dermatitis and other eczema due to other specified agent 01/01/2021   Hydronephrosis, left 01/01/2021   Other diseases of nasal cavity and sinuses(478.19) 01/01/2021   ESRD (end stage renal disease) ([place])    Reactive thrombocytosis 11/08/2020   Nephrotic syndrome 11/04/2020   SOB (shortness of breath) 11/01/2020   Acute on chronic combined systolic and  diastolic CHF (congestive heart failure) ([place]) 10/21/2020   CAD (coronary artery disease) 10/21/2020   Hyperlipidemia associated with type 2 diabetes mellitus ([place]) 10/21/2020   HFrEF (heart failure with reduced ejection fraction) (HCC)    Acute respiratory distress    COPD exacerbation ([place]) 10/10/2020   Kidney hematoma 09/13/2020   Essential hypertension    Acute renal failure superimposed on stage 4 chronic kidney disease ([place])    Hematuria 09/12/2020   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[place]) 09/03/2020   Blind left eye 09/03/2020   Brain aneurysm 09/03/2020   Esotropia 09/03/2020   Lesion of ulnar nerve 09/03/2020   Mixed hyperlipidemia 09/03/2020   Sensory hearing loss, bilateral 09/03/2020   Tear film insufficiency 09/03/2020   Health maintenance examination 06/27/2020   Secondary hyperparathyroidism of renal origin ([place]) 04/24/2020   Anemia in chronic kidney disease 03/10/2020   Benign hypertensive kidney disease with chronic kidney disease 03/10/2020   Hyposmolality and/or hyponatremia 03/10/2020   Stage 3b chronic kidney disease ([place]) 03/10/2020   Calf tenderness    Acute on chronic heart  failure ([place]) 02/27/2020   Leg edema    Type 2 diabetes mellitus with hyperlipidemia ([place]) 02/26/2020   CKD (chronic kidney disease) stage 4, GFR 15-29 ml/min (HCC) 02/26/2020   Hyponatremia 02/26/2020   Anasarca 02/26/2020   Proteinuria 02/26/2020   Hypoalbuminemia 02/26/2020   Elevated troponin 02/26/2020   Acute on chronic systolic CHF (congestive heart failure) ([place]) 02/26/2020   Aortic atherosclerosis ([place]) 02/18/2020   Hyperglycemia    Nausea    Hypertensive emergency    Hypertensive urgency 11/24/2019   Chest pain 11/23/2019   OSA (obstructive sleep apnea) 04/10/2019   Mild episode of recurrent major depressive disorder ([place]) 04/10/2019   ACS (acute coronary syndrome) ([place]) 03/24/2019   COVID-19 virus infection 03/24/2019   Hypertensive  heart/kidney disease w/chronic kidney disease stage III ([place]) 10/21/2017   Mild nonproliferative diabetic retinopathy of right eye associated with type 2 diabetes mellitus ([place]) 09/09/2017   Genital herpes 05/07/2017   DVT (deep venous thrombosis) ([place]) 12/30/2016   PVD (posterior vitreous detachment), right eye 01/09/2016   Diabetic peripheral angiopathy ([place]) 01/03/2016   Age-related nuclear cataract of both eyes 10/24/2015   Chronic nonintractable headache 10/02/2015   Dry eyes, bilateral 02/13/2015   Proptosis 12/18/2014   Iritis of right eye 12/03/2014   Anemia 08/30/2014   Depression 08/30/2014   PCP:  Kirk Ruths, MD Pharmacy:   [facility] [address], [place] - [place] [address] Phone: [phone] Fax: [facility] ([facility] - [place], [place] [address] Phone: [phone] Fax: [phone]     Social Determinants of Health (SDOH) Interventions    Readmission Risk Interventions Readmission Risk Prevention Plan 11/03/2020 10/23/2020 10/12/2020  Transportation Screening Complete Complete Complete  Medication Review (RN Care Manager) Complete Complete Complete  PCP or Specialist appointment within 3-5 days of discharge Not Complete Complete Complete  PCP/Specialist Appt Not Complete comments To be scheduled by unit secretary. - -  HRI or Home Care Consult Complete Complete -  SW Recovery Care/Counseling Consult Complete Complete Complete  Palliative Care Screening Not Applicable Not Applicable Not Applicable  Skilled Nursing Facility Patient Refused Not Applicable Not Complete  SNF Comments Patient refuses states she is able to take care of herself. - Patient pending evaluation  Some recent data might be hidden

## 2021-09-07 NOTE — ED Notes (Signed)
Dialysis Tech is back in the room.,

## 2021-09-07 NOTE — Progress Notes (Signed)
Patient completed dialysis treatment as ordered. 2 liters removed over 2.5 hours. Patient tolerated well, no adverse effect noted or reported.

## 2021-09-07 NOTE — Progress Notes (Signed)
Hemodialysis patient known at [facility] MWF 11:45a, please note that due to [place] patient will have a temporary chair time for 6:00am due to isolation precautions. Patient normally rides with [facility], however [facility] does not transport COVID + patients. Per patient, she does not have any other way to get to treatments. Andreas Newport CM is aware of transportation needs.

## 2021-09-07 NOTE — Progress Notes (Signed)
[facility]  ROUNDING NOTE   Subjective:  Terry Arroyo is a 60 years old female with past medical history of Chronic combined heart failure, last EF 06/13/21 35 to 40%, CAD s/p CABG, DM, ESRD on HD MWF, COPD, who presented to the ED with a complaint of shortness of breath and chest pain that started after dialysis treatment on 9/16.  She also has a dry cough, fevers and chills.  She got tested positive for Covid-19.  Patient seen and evaluated during dialysis   HEMODIALYSIS FLOWSHEET:  Blood Flow Rate (mL/min): 400 mL/min Arterial Pressure (mmHg): -180 mmHg Venous Pressure (mmHg): 180 mmHg Transmembrane Pressure (mmHg): 60 mmHg Ultrafiltration Rate (mL/min): 800 mL/min Dialysate Flow Rate (mL/min): 500 ml/min Conductivity: Machine : 14 Conductivity: Machine : 14  No complaints at this time    Objective:  Vital signs in last 24 hours:  Temp:  [98 F (36.7 C)-98.2 F (36.8 C)] 98.2 F (36.8 C) (09/19 1340) Pulse Rate:  [54-66] 57 (09/19 1340) Resp:  [13-28] 16 (09/19 1340) BP: (110-145)/(59-112) 141/65 (09/19 1300) SpO2:  [90 %-100 %] 95 % (09/19 1340)  Weight change:  Filed Weights   09/05/21 1951  Weight: 81.2 kg    Intake/Output: I/O last 3 completed shifts: In: 300 [IV Piggyback:300] Out: -    Intake/Output this shift:  Total I/O In: -  Out: 1500 [Other:1500]  Physical Exam: General: No acute distress  Head: Normocephalic, atraumatic. Moist oral mucosal membranes  Eyes: Anicteric  Lungs:  Clear bilaterally, normal effort, room air  Heart: Regular rate and rhythm  Abdomen:  Soft, nontender, non distended  Extremities:  No peripheral edema.  Neurologic: Nonfocal, moving all four extremities  Skin: No acute rashes or lesions  Access: AVF Rt UpperArm, +Bruit,+Thrill    Basic Metabolic Panel: Recent Labs  Lab 09/06/21 0734 09/07/21 0343  NA 137 133*  K 4.0 4.8  CL 100 97*  CO2 24 23  GLUCOSE 231* 235*  BUN 36* 66*  CREATININE 5.78*  7.22*  CALCIUM 8.6* 8.0*     Liver Function Tests: No results for input(s): AST, ALT, ALKPHOS, BILITOT, PROT, ALBUMIN in the last 168 hours. No results for input(s): LIPASE, AMYLASE in the last 168 hours. No results for input(s): AMMONIA in the last 168 hours.  CBC: Recent Labs  Lab 09/05/21 1956  WBC 7.8  HGB 12.9  HCT 40.3  MCV 87.8  PLT 241     Cardiac Enzymes: No results for input(s): CKTOTAL, CKMB, CKMBINDEX, TROPONINI in the last 168 hours.  BNP: Invalid input(s): POCBNP  CBG: Recent Labs  Lab 09/06/21 1110 09/06/21 1710 09/06/21 2025 09/07/21 0838 09/07/21 1314  GLUCAP 285* 153* 165* 270* 157*     Microbiology: Results for orders placed or performed during the hospital encounter of 09/05/21  Resp Panel by RT-PCR (Flu A&B, Covid) Nasopharyngeal Swab     Status: Abnormal   Collection Time: 09/05/21  8:18 PM   Specimen: Nasopharyngeal Swab; Nasopharyngeal(NP) swabs in vial transport medium  Result Value Ref Range Status   SARS Coronavirus 2 by RT PCR POSITIVE (A) NEGATIVE Final    Comment: RESULT CALLED TO, READ BACK BY AND VERIFIED WITH: CANDANCE NUCKLES @ 2120 09/05/21 LFD (NOTE) SARS-CoV-2 target nucleic acids are DETECTED.  The SARS-CoV-2 RNA is generally detectable in upper respiratory specimens during the acute phase of infection. Positive results are indicative of the presence of the identified virus, but do not rule out bacterial infection or co-infection with other pathogens not  detected by the test. Clinical correlation with patient history and other diagnostic information is necessary to determine patient infection status. The expected result is Negative.  Fact Sheet for Patients: EntrepreneurPulse.com.au  Fact Sheet for Healthcare Providers: IncredibleEmployment.be  This test is not yet approved or cleared by the [place] FDA and  has been authorized for detection and/or diagnosis of SARS-CoV-2  by FDA under an Emergency Use Authorization (EUA).  This EUA will remain in effect (meaning this test can  be used) for the duration of  the COVID-19 declaration under Section 564(b)(1) of the Act, 21 U.S.C. section 360bbb-3(b)(1), unless the authorization is terminated or revoked sooner.     Influenza A by PCR NEGATIVE NEGATIVE Final   Influenza B by PCR NEGATIVE NEGATIVE Final    Comment: (NOTE) The Xpert Xpress SARS-CoV-2/FLU/RSV plus assay is intended as an aid in the diagnosis of influenza from Nasopharyngeal swab specimens and should not be used as a sole basis for treatment. Nasal washings and aspirates are unacceptable for Xpert Xpress SARS-CoV-2/FLU/RSV testing.  Fact Sheet for Patients: EntrepreneurPulse.com.au  Fact Sheet for Healthcare Providers: IncredibleEmployment.be  This test is not yet approved or cleared by the [place] FDA and has been authorized for detection and/or diagnosis of SARS-CoV-2 by FDA under an Emergency Use Authorization (EUA). This EUA will remain in effect (meaning this test can be used) for the duration of the COVID-19 declaration under Section 564(b)(1) of the Act, 21 U.S.C. section 360bbb-3(b)(1), unless the authorization is terminated or revoked.  Performed at [facility], [address] [place], [place] [address]   Blood Culture (routine x 2)     Status: None (Preliminary result)   Collection Time: 09/05/21  8:18 PM   Specimen: BLOOD  Result Value Ref Range Status   Specimen Description BLOOD BLOOD LEFT HAND  Final   Special Requests   Final    BOTTLES DRAWN AEROBIC AND ANAEROBIC Blood Culture results may not be optimal due to an inadequate volume of blood received in culture bottles   Culture   Final    NO GROWTH 2 DAYS Performed at [facility], [address], [place], [place] [address]    Report Status PENDING  Incomplete    Coagulation Studies: No results for  input(s): LABPROT, INR in the last 72 hours.  Urinalysis: No results for input(s): COLORURINE, LABSPEC, PHURINE, GLUCOSEU, HGBUR, BILIRUBINUR, KETONESUR, PROTEINUR, UROBILINOGEN, NITRITE, LEUKOCYTESUR in the last 72 hours.  Invalid input(s): APPERANCEUR    Imaging: DG Chest 2 View  Result Date: 09/05/2021 CLINICAL DATA:  Chest pain and shortness of breath. EXAM: CHEST - 2 VIEW COMPARISON:  06/11/2021 FINDINGS: Postoperative changes in the mediastinum. Interval removal of central venous catheter. Shallow inspiration. Mild cardiac enlargement with mild central vascular congestion and mild perihilar interstitial pattern, likely early edema. Small left pleural effusion. No pneumothorax. Calcification of the aorta. IMPRESSION: Cardiac enlargement with mild vascular congestion, central edema, and small left pleural effusion. Similar appearance to previous study. Electronically Signed   By: Lucienne Capers M.D.   On: 09/05/2021 20:22     Medications:    remdesivir 100 mg in NS 100 mL Stopped (09/07/21 0952)    albuterol  2.5 mg Inhalation Q6H   amLODipine  10 mg Oral Daily   vitamin C  500 mg Oral Daily   aspirin EC  81 mg Oral Daily   atorvastatin  80 mg Oral q1800   carvedilol  25 mg Oral BID   cloNIDine  0.1 mg Oral BID   dexamethasone (DECADRON) injection  6 mg Intravenous Q24H   fenofibrate  160 mg Oral Daily   furosemide  40 mg Intravenous Q12H   heparin  5,000 Units Subcutaneous Q8H   insulin aspart  0-15 Units Subcutaneous TID WC   insulin aspart  0-5 Units Subcutaneous QHS   insulin glargine-yfgn  15 Units Subcutaneous QHS   isosorbide mononitrate  30 mg Oral Daily   pantoprazole  40 mg Oral Daily   pentafluoroprop-tetrafluoroeth       spironolactone  25 mg Oral Daily   topiramate  25 mg Oral Daily   traZODone  50 mg Oral QHS   zinc sulfate  220 mg Oral Daily   acetaminophen **OR** acetaminophen, chlorpheniramine-HYDROcodone, guaiFENesin-dextromethorphan, ondansetron **OR**  ondansetron (ZOFRAN) IV  Assessment/ Plan:  Terry Arroyo is a 60 y.o.  female with past medical history of Chronic combined heart failure, last EF 06/13/21 35 to 40%, CAD s/p CABG, DM, ESRD on HD MWF, COPD, who presented to the ED with a complaint of shortness of breath and chest pain that started after dialysis treatment on 9/16.  She also has a dry cough, fevers and chills.  She got tested positive for Covid.  [facility] [place]/MWF/ Rt AVF  #ESRD on HD  Last dialysis treatment was on 09/04/2021 Receiving dialysis today, tolerating well Next treatment scheduled for Wednesday Dialysis coordinator making outpatient arrangements for Covid status. Will notify patient of any changes to outpatient status  #Hypertension Blood Pressure was 181/87 on ED arrival, currently 141/65 Continue current antihypertensive regimen  #Secondary Hyperparathyroidism Lab Results  Component Value Date   CALCIUM 8.0 (L) 09/07/2021   CAION 1.09 (L) 05/13/2021   PHOS 3.4 11/12/2020   Will continue monitoring bone mineral metabolism parameters Phosphorus at goal  #Diabetes with CKD Lab Results  Component Value Date   HGBA1C 8.0 (H) 09/06/2021   Patient is on Insulin Aspart, diabetic management per primary team. Glucose stable     LOS: 2 Breon Rehm 9/19/20222:55 PM

## 2021-09-07 NOTE — ED Notes (Signed)
Pt ambulated to toilet and back to bed. 

## 2021-09-07 NOTE — ED Notes (Signed)
Dialysis to bedside.

## 2021-09-07 NOTE — ED Notes (Signed)
Dialysis Tech just left the pt. Told secretary she had to go to a meeting. I notified my Agricultural consultant.

## 2021-09-07 NOTE — Progress Notes (Addendum)
Inpatient Diabetes Program Recommendations  AACE/ADA: New Consensus Statement on Inpatient Glycemic Control (2015)  Target Ranges:  Prepandial:   less than 140 mg/dL      Peak postprandial:   less than 180 mg/dL (1-2 hours)      Critically ill patients:  140 - 180 mg/dL  Results for MEI, SUITS (MRN [id number]) as of 09/07/2021 09:51  Ref. Range 09/06/2021 08:15 09/06/2021 11:10 09/06/2021 17:10 09/06/2021 20:25  Glucose-Capillary Latest Ref Range: 70 - 99 mg/dL 211 (H)  5 units Novolog  285 (H)  8 units Novolog  153 (H)  3 units Novolog  165 (H)    7 units Semglee @2259   Results for REVERIE, VAQUERA (MRN [id number]) as of 09/07/2021 09:51  Ref. Range 09/07/2021 08:38  Glucose-Capillary Latest Ref Range: 70 - 99 mg/dL 270 (H)  8 units Novolog   Results for MIRCA, YALE (MRN [id number]) as of 09/07/2021 09:51  Ref. Range 06/11/2021 08:44 09/06/2021 07:23  Hemoglobin A1C Latest Ref Range: 4.8 - 5.6 % 9.6 (H) 8.0 (H)   Admit with: Pneumonia/respiratory infection due to COVID-19 virus  History: DM, ESRD, CHF  Home DM Meds: 70/30 Insulin 15 units BID  Current Orders: Semglee 7 units QHS      Novolog Moderate Correction Scale/ SSI (0-15 units) TID AC + HS      MD- Note patient getting Decadron 6 mg Daily.  Takes 70/30 Insulin 15 units BID at home.  Note Semglee started last PM.  CBG 270 this AM.  Please consider increasing the Semglee to 15 units QHS   Patient gets 21 units basal insulin in the 2 doses of 70/30 insulin at home--15 units Semglee would be ~70% of that total home dose long-acting insulin     --Will follow patient during hospitalization--  Wyn Quaker RN, MSN, CDE Diabetes Coordinator Inpatient Glycemic Control Team Team Pager: [phone] (8a-5p)

## 2021-09-08 LAB — PHOSPHORUS: Phosphorus: 5.5 mg/dL — ABNORMAL HIGH (ref 2.5–4.6)

## 2021-09-08 LAB — GLUCOSE, CAPILLARY
Glucose-Capillary: 300 mg/dL — ABNORMAL HIGH (ref 70–99)
Glucose-Capillary: 449 mg/dL — ABNORMAL HIGH (ref 70–99)
Glucose-Capillary: 324 mg/dL — ABNORMAL HIGH (ref 70–99)
Glucose-Capillary: 280 mg/dL — ABNORMAL HIGH (ref 70–99)

## 2021-09-08 LAB — HEPATITIS B SURFACE ANTIBODY, QUANTITATIVE: Hep B S AB Quant (Post): 25.8 m[IU]/mL (ref >9.9)

## 2021-09-08 MED ORDER — INSULIN GLARGINE-YFGN 100 UNIT/ML ~~LOC~~ SOLN
20.0000 [IU] | Freq: Every day | SUBCUTANEOUS | Status: DC
  Administered 2021-09-08: 20 [IU] via SUBCUTANEOUS
  Filled 2021-09-08 (×2): qty 0.2

## 2021-09-08 NOTE — Progress Notes (Signed)
[facility]  ROUNDING NOTE   Subjective:  Terry Arroyo is a 60 years old female with past medical history of Chronic combined heart failure, last EF 06/13/21 35 to 40%, CAD s/p CABG, DM, ESRD on HD MWF, COPD, who presented to the ED with a complaint of shortness of breath and chest pain that started after dialysis treatment on 9/16.  She also has a dry cough, fevers and chills.  She got tested positive for Covid-19.  Patient seen ambulating in room Alert and oriented Tolerating meals Denies shortness of breath   Objective:  Vital signs in last 24 hours:  Temp:  [97.8 F (36.6 C)-98.4 F (36.9 C)] 97.8 F (36.6 C) (09/20 1155) Pulse Rate:  [54-62] 57 (09/20 1155) Resp:  [13-20] 17 (09/20 1155) BP: (116-142)/(65-82) 139/82 (09/20 1155) SpO2:  [94 %-100 %] 98 % (09/20 1155) Weight:  [80.2 kg] 80.2 kg (09/20 0630)  Weight change:  Filed Weights   09/05/21 1951 09/08/21 0630  Weight: 81.2 kg 80.2 kg    Intake/Output: I/O last 3 completed shifts: In: -  Out: 1500 [Other:1500]   Intake/Output this shift:  No intake/output data recorded.  Physical Exam: General: No acute distress  Head: Normocephalic, atraumatic. Moist oral mucosal membranes  Eyes: Anicteric  Lungs:  Clear bilaterally, normal effort, room air  Heart: Regular rate and rhythm  Abdomen:  Soft, nontender, non distended  Extremities:  No peripheral edema.  Neurologic: Nonfocal, moving all four extremities  Skin: No acute rashes or lesions  Access: AVF Rt UpperArm, +Bruit,+Thrill    Basic Metabolic Panel: Recent Labs  Lab 09/06/21 0734 09/07/21 0343 09/07/21 2340  NA 137 133*  --   K 4.0 4.8  --   CL 100 97*  --   CO2 24 23  --   GLUCOSE 231* 235*  --   BUN 36* 66*  --   CREATININE 5.78* 7.22*  --   CALCIUM 8.6* 8.0*  --   PHOS  --   --  5.5*     Liver Function Tests: No results for input(s): AST, ALT, ALKPHOS, BILITOT, PROT, ALBUMIN in the last 168 hours. No results for input(s):  LIPASE, AMYLASE in the last 168 hours. No results for input(s): AMMONIA in the last 168 hours.  CBC: Recent Labs  Lab 09/05/21 1956  WBC 7.8  HGB 12.9  HCT 40.3  MCV 87.8  PLT 241     Cardiac Enzymes: No results for input(s): CKTOTAL, CKMB, CKMBINDEX, TROPONINI in the last 168 hours.  BNP: Invalid input(s): POCBNP  CBG: Recent Labs  Lab 09/07/21 0838 09/07/21 1314 09/07/21 1636 09/08/21 0811 09/08/21 1154  GLUCAP 270* 157* 226* 300* 449*     Microbiology: Results for orders placed or performed during the hospital encounter of 09/05/21  Resp Panel by RT-PCR (Flu A&B, Covid) Nasopharyngeal Swab     Status: Abnormal   Collection Time: 09/05/21  8:18 PM   Specimen: Nasopharyngeal Swab; Nasopharyngeal(NP) swabs in vial transport medium  Result Value Ref Range Status   SARS Coronavirus 2 by RT PCR POSITIVE (A) NEGATIVE Final    Comment: RESULT CALLED TO, READ BACK BY AND VERIFIED WITH: CANDANCE NUCKLES @ 2120 09/05/21 LFD (NOTE) SARS-CoV-2 target nucleic acids are DETECTED.  The SARS-CoV-2 RNA is generally detectable in upper respiratory specimens during the acute phase of infection. Positive results are indicative of the presence of the identified virus, but do not rule out bacterial infection or co-infection with other pathogens not detected by  the test. Clinical correlation with patient history and other diagnostic information is necessary to determine patient infection status. The expected result is Negative.  Fact Sheet for Patients: EntrepreneurPulse.com.au  Fact Sheet for Healthcare Providers: IncredibleEmployment.be  This test is not yet approved or cleared by the [place] FDA and  has been authorized for detection and/or diagnosis of SARS-CoV-2 by FDA under an Emergency Use Authorization (EUA).  This EUA will remain in effect (meaning this test can  be used) for the duration of  the COVID-19 declaration under  Section 564(b)(1) of the Act, 21 U.S.C. section 360bbb-3(b)(1), unless the authorization is terminated or revoked sooner.     Influenza A by PCR NEGATIVE NEGATIVE Final   Influenza B by PCR NEGATIVE NEGATIVE Final    Comment: (NOTE) The Xpert Xpress SARS-CoV-2/FLU/RSV plus assay is intended as an aid in the diagnosis of influenza from Nasopharyngeal swab specimens and should not be used as a sole basis for treatment. Nasal washings and aspirates are unacceptable for Xpert Xpress SARS-CoV-2/FLU/RSV testing.  Fact Sheet for Patients: EntrepreneurPulse.com.au  Fact Sheet for Healthcare Providers: IncredibleEmployment.be  This test is not yet approved or cleared by the [place] FDA and has been authorized for detection and/or diagnosis of SARS-CoV-2 by FDA under an Emergency Use Authorization (EUA). This EUA will remain in effect (meaning this test can be used) for the duration of the COVID-19 declaration under Section 564(b)(1) of the Act, 21 U.S.C. section 360bbb-3(b)(1), unless the authorization is terminated or revoked.  Performed at [facility], [address] [place], [place] [address]   Blood Culture (routine x 2)     Status: None (Preliminary result)   Collection Time: 09/05/21  8:18 PM   Specimen: BLOOD  Result Value Ref Range Status   Specimen Description BLOOD BLOOD LEFT HAND  Final   Special Requests   Final    BOTTLES DRAWN AEROBIC AND ANAEROBIC Blood Culture results may not be optimal due to an inadequate volume of blood received in culture bottles   Culture   Final    NO GROWTH 3 DAYS Performed at [facility], [address] [place], [place] [address]    Report Status PENDING  Incomplete  Culture, blood (Routine X 2) w Reflex to ID Panel     Status: None (Preliminary result)   Collection Time: 09/07/21  7:17 AM   Specimen: BLOOD LEFT HAND  Result Value Ref Range Status   Specimen Description  BLOOD LEFT HAND  Final   Special Requests   Final    BOTTLES DRAWN AEROBIC AND ANAEROBIC Blood Culture adequate volume   Culture   Final    NO GROWTH < 24 HOURS Performed at [facility], [address] [place], [place] [address]    Report Status PENDING  Incomplete    Coagulation Studies: No results for input(s): LABPROT, INR in the last 72 hours.  Urinalysis: No results for input(s): COLORURINE, LABSPEC, PHURINE, GLUCOSEU, HGBUR, BILIRUBINUR, KETONESUR, PROTEINUR, UROBILINOGEN, NITRITE, LEUKOCYTESUR in the last 72 hours.  Invalid input(s): APPERANCEUR    Imaging: No results found.   Medications:    remdesivir 100 mg in NS 100 mL Stopped (09/07/21 0952)    albuterol  2 puff Inhalation Q6H   amLODipine  10 mg Oral Daily   vitamin C  500 mg Oral Daily   aspirin EC  81 mg Oral Daily   atorvastatin  80 mg Oral q1800   carvedilol  25 mg Oral BID  cloNIDine  0.1 mg Oral BID   dexamethasone (DECADRON) injection  6 mg Intravenous Q24H   fenofibrate  160 mg Oral Daily   furosemide  40 mg Intravenous Q12H   heparin  5,000 Units Subcutaneous Q8H   insulin aspart  0-15 Units Subcutaneous TID WC   insulin aspart  0-5 Units Subcutaneous QHS   insulin glargine-yfgn  15 Units Subcutaneous QHS   isosorbide mononitrate  30 mg Oral Daily   pantoprazole  40 mg Oral Daily   spironolactone  25 mg Oral Daily   topiramate  25 mg Oral Daily   traZODone  50 mg Oral QHS   zinc sulfate  220 mg Oral Daily   acetaminophen **OR** acetaminophen, chlorpheniramine-HYDROcodone, guaiFENesin-dextromethorphan, ondansetron **OR** ondansetron (ZOFRAN) IV  Assessment/ Plan:  Ms. SANTINA TRILLO is a 60 y.o.  female with past medical history of Chronic combined heart failure, last EF 06/13/21 35 to 40%, CAD s/p CABG, DM, ESRD on HD MWF, COPD, who presented to the ED with a complaint of shortness of breath and chest pain that started after dialysis treatment on 9/16.  She also has a dry cough,  fevers and chills.  She got tested positive for Covid.  [facility]/MWF/ Rt AVF  #ESRD on HD  Received dialysis yesterday, tolerated well Next treatment scheduled for Wednesday Dialysis coordinator making outpatient arrangements for Covid status. Patient informed of updated outpatient time, currently arranging transportation to treatments  #Hypertension Blood Pressure was 181/87 on ED arrival, currently improved 139/82 Continue current antihypertensive regimen  #Secondary Hyperparathyroidism Lab Results  Component Value Date   CALCIUM 8.0 (L) 09/07/2021   CAION 1.09 (L) 05/13/2021   PHOS 5.5 (H) 09/07/2021   Will continue monitoring bone mineral metabolism parameters Phosphorus elevated  #Diabetes with CKD Lab Results  Component Value Date   HGBA1C 8.0 (H) 09/06/2021   Patient is on Insulin Aspart, diabetic management per primary team. Glucose elevated this am, 449. Receiving steroid therapy  #Covid-19 Remdesivir and Dexamethasone ordered     LOS: 3 Frazier Balfour 9/20/202212:20 PM

## 2021-09-08 NOTE — TOC Progression Note (Signed)
Transition of Care [facility]) - Progression Note    Patient Details  Name: Terry Arroyo MRN: [id number] Date of Birth: [birth year]  Transition of [facility]) CM/SW Contact  Eileen Stanford, LCSW Phone Number: 09/08/2021, 3:08 PM  Clinical Narrative:   CSW consulted with [facility] Supervisor. CSW called [facility] and the employee took the information and would have to ask his supervisor -- due to the fact that pt would be dropped off at a non [facility] facility. There was concern of payment. CSW awaiting a call back. At this time pt does not have transport to HD.         Expected Discharge Plan and Services                                                 Social Determinants of Health (SDOH) Interventions    Readmission Risk Interventions Readmission Risk Prevention Plan 11/03/2020 10/23/2020 10/12/2020  Transportation Screening Complete Complete Complete  Medication Review Press photographer) Complete Complete Complete  PCP or Specialist appointment within 3-5 days of discharge Not Complete Complete Complete  PCP/Specialist Appt Not Complete comments To be scheduled by unit secretary. - -  HRI or Home Care Consult Complete Complete -  SW Recovery Care/Counseling Consult Complete Complete Complete  Palliative Care Screening Not Applicable Not Applicable Not Applicable  Skilled Nursing Facility Patient Refused Not Applicable Not Complete  SNF Comments Patient refuses states she is able to take care of herself. - Patient pending evaluation  Some recent data might be hidden

## 2021-09-08 NOTE — Progress Notes (Signed)
Inpatient Diabetes Program Recommendations  AACE/ADA: New Consensus Statement on Inpatient Glycemic Control (2015)  Target Ranges:  Prepandial:   less than 140 mg/dL      Peak postprandial:   less than 180 mg/dL (1-2 hours)      Critically ill patients:  140 - 180 mg/dL   Lab Results  Component Value Date   GLUCAP 300 (H) 09/08/2021   HGBA1C 8.0 (H) 09/06/2021    Review of Glycemic Control Results for Terry Arroyo, Terry Arroyo (MRN [id number]) as of 09/08/2021 11:16  Ref. Range 09/06/2021 08:15 09/06/2021 11:10 09/06/2021 17:10 09/06/2021 20:25 09/07/2021 08:38 09/07/2021 13:14 09/07/2021 16:36 09/08/2021 08:11  Glucose-Capillary Latest Ref Range: 70 - 99 mg/dL 211 (H) 285 (H) 153 (H) 165 (H) 270 (H) 157 (H) 226 (H) 300 (H)   Inpatient Diabetes Program Recommendations:   Fasting CBG 300. -Consider increase Semglee to 20 units q am. Secure chat sent to Dr. Leslye Peer.  Thank you, Nani Gasser. Maylen Waltermire, RN, MSN, CDE  Diabetes Coordinator Inpatient Glycemic Control Team Team Pager [phone] (8am-5pm) 09/08/2021 11:18 AM

## 2021-09-08 NOTE — Progress Notes (Signed)
Patient ID: Terry Arroyo, female   DOB: [birth year], 60 y.o.   MRN: [id number] [facility] PROGRESS NOTE  MILIYAH LUPER IDP:[id number] DOB: May 15, 1961 DOA: 09/05/2021 PCP: Kirk Ruths, MD  HPI/Subjective: Patient this morning had a little nausea but was feeling well enough to go home.  Because of her COVID-19 infection and her transportation to dialysis will not take her and we were trying to work on other options to get to dialysis.  Admitted with shortness of breath.  Objective: Vitals:   09/08/21 0813 09/08/21 1155  BP: 137/78 139/82  Pulse: 60 (!) 57  Resp: 18 17  Temp: 97.9 F (36.6 C) 97.8 F (36.6 C)  SpO2: 96% 98%   No intake or output data in the 24 hours ending 09/08/21 1619 Filed Weights   09/05/21 1951 09/08/21 0630  Weight: 81.2 kg 80.2 kg    ROS: Review of Systems  Respiratory:  Negative for shortness of breath.   Cardiovascular:  Negative for chest pain.  Gastrointestinal:  Positive for nausea. Negative for abdominal pain and vomiting.  Exam: Physical Exam HENT:     Head: Normocephalic.     Mouth/Throat:     Pharynx: No oropharyngeal exudate.  Eyes:     General: Lids are normal.     Conjunctiva/sclera: Conjunctivae normal.  Cardiovascular:     Rate and Rhythm: Normal rate and regular rhythm.     Heart sounds: Normal heart sounds, S1 normal and S2 normal.  Pulmonary:     Breath sounds: Examination of the right-lower field reveals decreased breath sounds. Examination of the left-lower field reveals decreased breath sounds. Decreased breath sounds present. No wheezing, rhonchi or rales.  Abdominal:     Palpations: Abdomen is soft.     Tenderness: There is no abdominal tenderness.  Musculoskeletal:     Right lower leg: No swelling.     Left lower leg: No swelling.  Skin:    General: Skin is warm.     Findings: No rash.  Neurological:     Mental Status: She is alert and oriented to person, place, and time.      Scheduled Meds:   albuterol  2 puff Inhalation Q6H   amLODipine  10 mg Oral Daily   vitamin C  500 mg Oral Daily   aspirin EC  81 mg Oral Daily   atorvastatin  80 mg Oral q1800   carvedilol  25 mg Oral BID   cloNIDine  0.1 mg Oral BID   dexamethasone (DECADRON) injection  6 mg Intravenous Q24H   fenofibrate  160 mg Oral Daily   furosemide  40 mg Intravenous Q12H   heparin  5,000 Units Subcutaneous Q8H   insulin aspart  0-15 Units Subcutaneous TID WC   insulin aspart  0-5 Units Subcutaneous QHS   insulin glargine-yfgn  20 Units Subcutaneous QHS   isosorbide mononitrate  30 mg Oral Daily   pantoprazole  40 mg Oral Daily   spironolactone  25 mg Oral Daily   topiramate  25 mg Oral Daily   traZODone  50 mg Oral QHS   zinc sulfate  220 mg Oral Daily   Continuous Infusions:  remdesivir 100 mg in NS 100 mL 100 mg (09/08/21 1323)   Brief history.  Patient admitted 07/05/2021 with shortness of breath and COVID-19 infection with mild fluid overload.  Patient felt better after 1 dialysis session and starting remdesivir and Decadron.  Unfortunately the patient has transportation to dialysis and they will not take  her with the positive COVID infection.  Transitional care team trying to work on other options.  Assessment/Plan:  COVID-19 infection.  Chest x-ray read as no pneumonia.  Continue remdesivir for 5 days.  Will discontinue Decadron with sugars rising.  Sepsis ruled out.  Patient on room air. Acute on chronic systolic congestive heart failure.  Last EF 35 to 40%.  Patient on IV Lasix, dialysis to manage fluid.  Coreg and spironolactone.  Patient has allergy to ACE inhibitor. Lactic acidosis secondary to COVID-19 infection Elevated troponin likely false-positive from end-stage renal disease.  First troponin 26-second troponin 23. End-stage renal disease on Monday Wednesday Friday.  Transitional care team looking into transportation options with the patient being COVID-positive. Type 2 diabetes mellitus with  hyperlipidemia.  On atorvastatin.  Increase Semglee insulin to 20 units daily.  Continue sliding scale insulin.  I will discontinue steroids since the patient is now on room air and breathing better. Essential hypertension on amlodipine, Coreg, clonidine, Imdur and spironolactone.  Blood pressure high upon admission but better controlled Diabetic neuropathy bilateral feet.     Code Status:     Code Status Orders  (From admission, onward)           Start     Ordered   09/05/21 2305  Full code  Continuous        09/05/21 2306           Code Status History     Date Active Date Inactive Code Status Order ID Comments User Context   06/11/2021 1133 06/16/2021 0044 Full Code [id number]  CoxBriant Cedar, DO ED   03/20/2021 1715 03/22/2021 2008 Full Code [id number]  Clarnce Flock, MD Inpatient   02/09/2021 1614 02/11/2021 2052 Full Code [id number]  Collier Bullock, MD ED   11/01/2020 2136 11/12/2020 2358 Full Code [id number]  Para Skeans, MD ED   10/22/2020 0016 10/28/2020 2317 Full Code [id number]  Lenore Cordia, MD ED   10/10/2020 2233 10/12/2020 2036 Full Code [id number]  Mansy, Arvella Merles, MD ED   09/12/2020 1701 09/15/2020 1902 Full Code [id number]  Wyvonnia Dusky, MD ED   02/26/2020 1946 03/02/2020 2055 Full Code [id number]  Toy Baker, MD ED   01/22/2020 0208 01/23/2020 2047 Full Code [id number]  Mansy, Arvella Merles, MD ED   11/23/2019 1827 11/26/2019 1613 Full Code [id number]  Lavina Hamman, MD ED      Disposition Plan: Status is: Inpatient  Dispo: The patient is from: Home              Anticipated d/c is to: Home              Patient currently medically stable for discharge but the patient does not have transportation to dialysis secondary to being COVID-positive.  Transitional care team looking into options.   Difficult to place patient.  No.  Difficult to find transportation: Yes.  Time spent: 27 minutes  [place]

## 2021-09-09 LAB — GLUCOSE, CAPILLARY
Glucose-Capillary: 270 mg/dL — ABNORMAL HIGH (ref 70–99)
Glucose-Capillary: 160 mg/dL — ABNORMAL HIGH (ref 70–99)

## 2021-09-09 LAB — BASIC METABOLIC PANEL
Anion gap: 14 (ref 5–15)
BUN: 77 mg/dL — ABNORMAL HIGH (ref 6–20)
CO2: 25 mmol/L (ref 22–32)
Calcium: 7.5 mg/dL — ABNORMAL LOW (ref 8.9–10.3)
Chloride: 94 mmol/L — ABNORMAL LOW (ref 98–111)
Creatinine, Ser: 6.66 mg/dL — ABNORMAL HIGH (ref 0.44–1.00)
GFR, Estimated: 7 mL/min — ABNORMAL LOW (ref >60)
Glucose, Bld: 222 mg/dL — ABNORMAL HIGH (ref 70–99)
Potassium: 4.1 mmol/L (ref 3.5–5.1)
Sodium: 133 mmol/L — ABNORMAL LOW (ref 135–145)

## 2021-09-09 LAB — CBC
HCT: 33.5 % — ABNORMAL LOW (ref 36.0–46.0)
Hemoglobin: 10.8 g/dL — ABNORMAL LOW (ref 12.0–15.0)
MCH: 28.3 pg (ref 26.0–34.0)
MCHC: 32.2 g/dL (ref 30.0–36.0)
MCV: 87.9 fL (ref 80.0–100.0)
Platelets: 186 10*3/uL (ref 150–400)
RBC: 3.81 MIL/uL — ABNORMAL LOW (ref 3.87–5.11)
RDW: 14.3 % (ref 11.5–15.5)
WBC: 5.1 10*3/uL (ref 4.0–10.5)
nRBC: 0 % (ref 0.0–0.2)

## 2021-09-09 NOTE — Progress Notes (Signed)
[facility]  ROUNDING NOTE   Subjective:  Terry Arroyo is a 60 years old female with past medical history of Chronic combined heart failure, last EF 06/13/21 35 to 40%, CAD s/p CABG, DM, ESRD on HD MWF, COPD, who presented to the ED with a complaint of shortness of breath and chest pain that started after dialysis treatment on 9/16.  She also has a dry cough, fevers and chills.  She got tested positive for Covid-19.  Patient seen resting in bed Tolerating meals Denies shortness of breath No other concerns at this time   Objective:  Vital signs in last 24 hours:  Temp:  [97.7 F (36.5 C)-98.8 F (37.1 C)] 97.9 F (36.6 C) (09/21 1350) Pulse Rate:  [53-59] 55 (09/21 1416) Resp:  [17-18] 18 (09/21 1416) BP: (121-139)/(62-72) 130/65 (09/21 1416) SpO2:  [97 %-99 %] 98 % (09/21 1416) Weight:  [81.9 kg] 81.9 kg (09/21 0416)  Weight change: 1.7 kg Filed Weights   09/05/21 1951 09/08/21 0630 09/09/21 0416  Weight: 81.2 kg 80.2 kg 81.9 kg    Intake/Output: No intake/output data recorded.   Intake/Output this shift:  Total I/O In: 480 [P.O.:480] Out: -   Physical Exam: General: No acute distress  Head: Normocephalic, atraumatic. Moist oral mucosal membranes  Eyes: Anicteric  Lungs:  Clear bilaterally, normal effort, room air  Heart: Regular rate and rhythm  Abdomen:  Soft, nontender, non distended  Extremities:  No peripheral edema.  Neurologic: Nonfocal, moving all four extremities  Skin: No acute rashes or lesions  Access: AVF Rt UpperArm, +Bruit,+Thrill    Basic Metabolic Panel: Recent Labs  Lab 09/06/21 0734 09/07/21 0343 09/07/21 2340 09/09/21 0421  NA 137 133*  --  133*  K 4.0 4.8  --  4.1  CL 100 97*  --  94*  CO2 24 23  --  25  GLUCOSE 231* 235*  --  222*  BUN 36* 66*  --  77*  CREATININE 5.78* 7.22*  --  6.66*  CALCIUM 8.6* 8.0*  --  7.5*  PHOS  --   --  5.5*  --      Liver Function Tests: No results for input(s): AST, ALT, ALKPHOS,  BILITOT, PROT, ALBUMIN in the last 168 hours. No results for input(s): LIPASE, AMYLASE in the last 168 hours. No results for input(s): AMMONIA in the last 168 hours.  CBC: Recent Labs  Lab 09/05/21 1956 09/09/21 0421  WBC 7.8 5.1  HGB 12.9 10.8*  HCT 40.3 33.5*  MCV 87.8 87.9  PLT 241 186     Cardiac Enzymes: No results for input(s): CKTOTAL, CKMB, CKMBINDEX, TROPONINI in the last 168 hours.  BNP: Invalid input(s): POCBNP  CBG: Recent Labs  Lab 09/08/21 0811 09/08/21 1154 09/08/21 1710 09/08/21 2020 09/09/21 1228  GLUCAP 300* 449* 324* 280* 270*     Microbiology: Results for orders placed or performed during the hospital encounter of 09/05/21  Resp Panel by RT-PCR (Flu A&B, Covid) Nasopharyngeal Swab     Status: Abnormal   Collection Time: 09/05/21  8:18 PM   Specimen: Nasopharyngeal Swab; Nasopharyngeal(NP) swabs in vial transport medium  Result Value Ref Range Status   SARS Coronavirus 2 by RT PCR POSITIVE (A) NEGATIVE Final    Comment: RESULT CALLED TO, READ BACK BY AND VERIFIED WITH: CANDANCE NUCKLES @ 2120 09/05/21 LFD (NOTE) SARS-CoV-2 target nucleic acids are DETECTED.  The SARS-CoV-2 RNA is generally detectable in upper respiratory specimens during the acute phase of infection. Positive  results are indicative of the presence of the identified virus, but do not rule out bacterial infection or co-infection with other pathogens not detected by the test. Clinical correlation with patient history and other diagnostic information is necessary to determine patient infection status. The expected result is Negative.  Fact Sheet for Patients: EntrepreneurPulse.com.au  Fact Sheet for Healthcare Providers: IncredibleEmployment.be  This test is not yet approved or cleared by the [place] FDA and  has been authorized for detection and/or diagnosis of SARS-CoV-2 by FDA under an Emergency Use Authorization (EUA).  This EUA  will remain in effect (meaning this test can  be used) for the duration of  the COVID-19 declaration under Section 564(b)(1) of the Act, 21 U.S.C. section 360bbb-3(b)(1), unless the authorization is terminated or revoked sooner.     Influenza A by PCR NEGATIVE NEGATIVE Final   Influenza B by PCR NEGATIVE NEGATIVE Final    Comment: (NOTE) The Xpert Xpress SARS-CoV-2/FLU/RSV plus assay is intended as an aid in the diagnosis of influenza from Nasopharyngeal swab specimens and should not be used as a sole basis for treatment. Nasal washings and aspirates are unacceptable for Xpert Xpress SARS-CoV-2/FLU/RSV testing.  Fact Sheet for Patients: EntrepreneurPulse.com.au  Fact Sheet for Healthcare Providers: IncredibleEmployment.be  This test is not yet approved or cleared by the [place] FDA and has been authorized for detection and/or diagnosis of SARS-CoV-2 by FDA under an Emergency Use Authorization (EUA). This EUA will remain in effect (meaning this test can be used) for the duration of the COVID-19 declaration under Section 564(b)(1) of the Act, 21 U.S.C. section 360bbb-3(b)(1), unless the authorization is terminated or revoked.  Performed at [facility], [address] [place], [place] [address]   Blood Culture (routine x 2)     Status: None (Preliminary result)   Collection Time: 09/05/21  8:18 PM   Specimen: BLOOD  Result Value Ref Range Status   Specimen Description BLOOD BLOOD LEFT HAND  Final   Special Requests   Final    BOTTLES DRAWN AEROBIC AND ANAEROBIC Blood Culture results may not be optimal due to an inadequate volume of blood received in culture bottles   Culture   Final    NO GROWTH 4 DAYS Performed at [facility], [address], [place], [place] [address]    Report Status PENDING  Incomplete  Culture, blood (Routine X 2) w Reflex to ID Panel     Status: None (Preliminary result)   Collection  Time: 09/07/21  7:17 AM   Specimen: BLOOD LEFT HAND  Result Value Ref Range Status   Specimen Description BLOOD LEFT HAND  Final   Special Requests   Final    BOTTLES DRAWN AEROBIC AND ANAEROBIC Blood Culture adequate volume   Culture   Final    NO GROWTH 2 DAYS Performed at [facility], [address] [place], [place] [address]    Report Status PENDING  Incomplete    Coagulation Studies: No results for input(s): LABPROT, INR in the last 72 hours.  Urinalysis: No results for input(s): COLORURINE, LABSPEC, PHURINE, GLUCOSEU, HGBUR, BILIRUBINUR, KETONESUR, PROTEINUR, UROBILINOGEN, NITRITE, LEUKOCYTESUR in the last 72 hours.  Invalid input(s): APPERANCEUR    Imaging: No results found.   Medications:    remdesivir 100 mg in NS 100 mL 100 mg (09/08/21 1323)    albuterol  2 puff Inhalation Q6H   amLODipine  10 mg Oral Daily   vitamin C  500 mg Oral Daily  aspirin EC  81 mg Oral Daily   atorvastatin  80 mg Oral q1800   carvedilol  25 mg Oral BID   cloNIDine  0.1 mg Oral BID   fenofibrate  160 mg Oral Daily   furosemide  40 mg Intravenous Q12H   heparin  5,000 Units Subcutaneous Q8H   insulin aspart  0-15 Units Subcutaneous TID WC   insulin aspart  0-5 Units Subcutaneous QHS   insulin glargine-yfgn  20 Units Subcutaneous QHS   isosorbide mononitrate  30 mg Oral Daily   pantoprazole  40 mg Oral Daily   spironolactone  25 mg Oral Daily   topiramate  25 mg Oral Daily   traZODone  50 mg Oral QHS   zinc sulfate  220 mg Oral Daily   acetaminophen **OR** acetaminophen, chlorpheniramine-HYDROcodone, guaiFENesin-dextromethorphan, ondansetron **OR** ondansetron (ZOFRAN) IV  Assessment/ Plan:  Terry Arroyo is a 60 y.o.  female with past medical history of Chronic combined heart failure, last EF 06/13/21 35 to 40%, CAD s/p CABG, DM, ESRD on HD MWF, COPD, who presented to the ED with a complaint of shortness of breath and chest pain that started after dialysis  treatment on 9/16.  She also has a dry cough, fevers and chills.  She got tested positive for Covid.  [facility]/MWF/ Rt AVF  #ESRD on HD  Scheduled to receive dialysis today Patient informed of updated outpatient time and transportation arranged   #Hypertension Blood Pressure was 181/87 on ED arrival, stable 130/65 Continue current antihypertensive regimen  #Secondary Hyperparathyroidism Lab Results  Component Value Date   CALCIUM 7.5 (L) 09/09/2021   CAION 1.09 (L) 05/13/2021   PHOS 5.5 (H) 09/07/2021   Will continue monitoring bone mineral metabolism parameters   #Diabetes with CKD Lab Results  Component Value Date   HGBA1C 8.0 (H) 09/06/2021   Patient is on Insulin Aspart, diabetic management per primary team. Glucose improved to 270. Receiving steroid therapy  #Covid-19 Remdesivir and Dexamethasone ordered     LOS: 4 Jeimy Bickert 9/21/20222:27 PM

## 2021-09-09 NOTE — Discharge Summary (Addendum)
Physician Discharge Summary  Patient ID: Terry Arroyo MRN: [id number] DOB/AGE: September 17, 1961 60 y.o.  Admit date: 09/05/2021 Discharge date: 09/09/2021  Admission Diagnoses:  Discharge Diagnoses:  Principal Problem:   Sepsis ([place]) Active Problems:   Hypertensive urgency   Type 2 diabetes mellitus with hyperlipidemia (HCC)   Elevated troponin   Essential hypertension   Acute on chronic combined systolic and diastolic CHF (congestive heart failure) (HCC)   CAD (coronary artery disease)   ESRD (end stage renal disease) ([place])   COVID-19 virus infection   COPD (chronic obstructive pulmonary disease) with emphysema (HCC)   Acute CHF (congestive heart failure) ([place]) Sepsis is ruled out.  Discharged Condition: good  Hospital Course:  Patient admitted 07/05/2021 with shortness of breath and COVID-19 infection with mild fluid overload.  Patient felt better after 1 dialysis session and starting remdesivir and Decadron.  Unfortunately the patient has transportation to dialysis and they will not take her with the positive COVID infection.   #1.  COVID infection. Lactic acidosis. Patient has completed 4 days of remdesivir, steroids was discontinued due to increased glucose.  Currently, patient does not have any hypoxemia. At this point, she is medically stable to be discharged and to follow-up with PCP as outpatient.  2.  Acute on chronic systolic congestive heart failure. Ejection fraction 35 to 40%.  Condition improved after given dialysis. Resume home medicines.  #3.  Type 2 diabetes with hyperglycemia. Resume home regimen.  4.  End-stage renal disease. Patient was taking Lasix and Aldactone, she was also chronic taking potassium.  Based on previous labs, patient had a hypokalemia, I did not change diuretics dose and potassium dose.      Consults: nephrology  Significant Diagnostic Studies:   Treatments: Remdesivir, HD.  Discharge Exam: Blood pressure 126/68, pulse (!) 58,  temperature 98.6 F (37 C), temperature source Oral, resp. rate 18, height 5\' 6"  (1.676 m), weight 81.9 kg, SpO2 98 %. General appearance: alert and cooperative Resp: clear to auscultation bilaterally Cardio: regular rate and rhythm, S1, S2 normal, no murmur, click, rub or gallop GI: soft, non-tender; bowel sounds normal; no masses,  no organomegaly Extremities: extremities normal, atraumatic, no cyanosis or edema  Disposition: Discharge disposition: 01-Home or Self Care       Discharge Instructions     Diet Carb Modified   Complete by: As directed    Increase activity slowly   Complete by: As directed       Allergies as of 09/09/2021       Reactions   Hydralazine Hives, Shortness Of Breath, Swelling, Rash   Body aches   Kiwi Extract Hives, Swelling, Rash   Mouth swells    Lisinopril Swelling   Angioedema   Shellfish Allergy Anaphylaxis   Shrimp/lobster  Betadine leaves welts on skin    Betadine [povidone Iodine] Hives, Rash   LEAVES WELTS ON SKIN   Metformin Diarrhea, Other (See Comments)   GI Intolerance   Tape Itching   Use paper tape whenever possible        Medication List     STOP taking these medications    potassium chloride 10 MEQ tablet Commonly known as: KLOR-CON       TAKE these medications    acetaminophen 500 MG tablet Commonly known as: TYLENOL Take 500-1,000 mg by mouth every 6 (six) hours as needed for mild pain or moderate pain.   albuterol 108 (90 Base) MCG/ACT inhaler Commonly known as: VENTOLIN HFA Inhale 2 puffs into the  lungs every 6 (six) hours as needed for wheezing or shortness of breath.   amLODipine 10 MG tablet Commonly known as: NORVASC Take 1 tablet (10 mg total) by mouth daily.   ammonium lactate 12 % lotion Commonly known as: LAC-HYDRIN Apply 1 application topically daily.   aspirin 81 MG EC tablet Take 1 tablet (81 mg total) by mouth daily.   atorvastatin 80 MG tablet Commonly known as: LIPITOR Take 1  tablet (80 mg total) by mouth daily at 6 PM.   bismuth subsalicylate 374 MO/70BE suspension Commonly known as: PEPTO BISMOL Take 30 mLs by mouth every 6 (six) hours as needed for indigestion or diarrhea or loose stools.   carvedilol 25 MG tablet Commonly known as: COREG Take 25 mg by mouth 2 (two) times daily.   cloNIDine 0.1 MG tablet Commonly known as: CATAPRES Take 0.1 mg by mouth 2 (two) times daily. (Non-dialysis days)   Dulera 200-5 MCG/ACT Aero Generic drug: mometasone-formoterol Inhale 2 puffs into the lungs 2 (two) times daily.   fenofibrate 145 MG tablet Commonly known as: TRICOR Take 145 mg by mouth daily.   furosemide 40 MG tablet Commonly known as: LASIX Take 40 mg by mouth daily.   insulin aspart protamine- aspart (70-30) 100 UNIT/ML injection Commonly known as: NOVOLOG MIX 70/30 Inject 0.15 mLs (15 Units total) into the skin 2 (two) times daily with a meal.   isosorbide mononitrate 30 MG 24 hr tablet Commonly known as: IMDUR Take 30 mg by mouth daily.   loperamide 2 MG tablet Commonly known as: IMODIUM A-D Take 4 mg by mouth 4 (four) times daily as needed for diarrhea or loose stools.   mirtazapine 30 MG tablet Commonly known as: REMERON Take 1 tablet (30 mg total) by mouth at bedtime.   omeprazole 40 MG capsule Commonly known as: PRILOSEC Take 40 mg by mouth daily.   polyethylene glycol 17 g packet Commonly known as: MIRALAX / GLYCOLAX Take 17 g by mouth daily.   spironolactone 25 MG tablet Commonly known as: ALDACTONE Take 1 tablet (25 mg total) by mouth daily.   topiramate 25 MG tablet Commonly known as: TOPAMAX Take 25 mg by mouth daily.   traZODone 50 MG tablet Commonly known as: DESYREL Take 50 mg by mouth at bedtime.   vitamin B-12 1000 MCG tablet Commonly known as: CYANOCOBALAMIN Take 1,000 mcg by mouth daily.        Follow-up Information     Kirk Ruths, MD Follow up in 1 week(s).   Specialty: Internal  Medicine Contact information: [place] [address] [phone]               More than 30 minutes Signed: Sharen Hones 09/09/2021, 11:59 AM

## 2021-09-09 NOTE — Progress Notes (Signed)
Patient started dialysis treatment as ordered. 1.5 liters removed over 2.5 hours. Patient tolerated treatment well. Report given to floor nurse Koren Bound, RN.

## 2021-09-09 NOTE — Progress Notes (Signed)
Mobility Specialist - Progress Note   09/09/21 1100  Mobility  Activity Ambulated in room  Level of Assistance Independent  Assistive Device None  Distance Ambulated (ft) 60 ft  Mobility Ambulated independently in room  Mobility Response Tolerated well  Mobility performed by Mobility specialist  $Mobility charge 1 Mobility    Post-mobility: 55 HR, 97% SpO2   Pt sleeping on arrival, awakened by voice. Dizzy sitting EOB, resolved. Ambulated in room independently. Denies chest pain and nausea. Does voice fatigue, limiting further ambulation.    Kathee Delton Mobility Specialist 09/09/21, 11:49 AM

## 2021-09-09 NOTE — TOC Progression Note (Signed)
Transition of Care [facility]) - Progression Note    Patient Details  Name: Terry Arroyo MRN: [id number] Date of Birth: 1961-03-02  Transition of [facility]) CM/SW Ramos, RN Phone Number: 09/09/2021, 11:37 AM  Clinical Narrative:    Call to [place] with [facility] @ [phone] confirmed [facility] is able to arrange transportation to and from HD during [place]. Patient can discharge after HD today and [facility] will contact patient to confirm pickup time and location.          Expected Discharge Plan and Services                                                 Social Determinants of Health (SDOH) Interventions    Readmission Risk Interventions Readmission Risk Prevention Plan 11/03/2020 10/23/2020 10/12/2020  Transportation Screening Complete Complete Complete  Medication Review Press photographer) Complete Complete Complete  PCP or Specialist appointment within 3-5 days of discharge Not Complete Complete Complete  PCP/Specialist Appt Not Complete comments To be scheduled by unit secretary. - -  HRI or Home Care Consult Complete Complete -  SW Recovery Care/Counseling Consult Complete Complete Complete  Palliative Care Screening Not Applicable Not Applicable Not Applicable  Skilled Nursing Facility Patient Refused Not Applicable Not Complete  SNF Comments Patient refuses states she is able to take care of herself. - Patient pending evaluation  Some recent data might be hidden

## 2021-09-09 NOTE — Progress Notes (Signed)
Inpatient Diabetes Program Recommendations  AACE/ADA: New Consensus Statement on Inpatient Glycemic Control   Target Ranges:  Prepandial:   less than 140 mg/dL      Peak postprandial:   less than 180 mg/dL (1-2 hours)      Critically ill patients:  140 - 180 mg/dL  Results for CHRISTIN, MOLINE (MRN [id number]) as of 09/09/2021 10:17  Ref. Range 09/09/2021 04:21  Glucose Latest Ref Range: 70 - 99 mg/dL 222 (H)    Results for LAMEKIA, NOLDEN (MRN [id number]) as of 09/09/2021 10:17  Ref. Range 09/08/2021 08:11 09/08/2021 11:54 09/08/2021 17:10 09/08/2021 20:20  Glucose-Capillary Latest Ref Range: 70 - 99 mg/dL 300 (H) 449 (H) 324 (H) 280 (H)   Review of Glycemic Control  Diabetes history: DM Outpatient Diabetes medications: 70/30 15 units BID Current orders for Inpatient glycemic control: Semglee 20 units daily, Novolog 0-15 units TID with meals, Novolog 0-5 units QHS  Inpatient Diabetes Program Recommendations:    Insulin: If post prandial glucose remains consistently over 180 mg/dl, please consider ordering Novolog 4 units TID with meals for meal coverage if patient eats at least 50% of meals.  NOTE: Patient last received Decadron 6 mg on 9/219/22 at 00:00.  Thanks, Barnie Alderman, RN, MSN, CDE Diabetes Coordinator Inpatient Diabetes Program [phone] (Team Pager from 8am to 5pm)

## 2021-09-10 ENCOUNTER — Telehealth: Payer: Self-pay | Admitting: Internal Medicine

## 2021-09-10 LAB — CULTURE, BLOOD (ROUTINE X 2): Culture: NO GROWTH

## 2021-09-10 NOTE — Telephone Encounter (Signed)
   Terry Arroyo DOB: 03/31/1961 MRN: [id number]   RIDER WAIVER AND RELEASE OF LIABILITY  For purposes of improving physical access to our facilities, [facility] is pleased to partner with third parties to provide [place] patients or other authorized individuals the option of convenient, on-demand ground transportation services (the Technical brewer") through use of the technology service that enables users to request on-demand ground transportation from independent third-party providers.  By opting to use and accept these [employer], I, the undersigned, hereby agree on behalf of myself, and on behalf of any minor child using the Government social research officer for whom I am the parent or legal guardian, as follows:  Government social research officer provided to me are provided by independent third-party transportation providers who are not [employer] or employees and who are unaffiliated with [employer]. [place] is neither a transportation carrier nor a common or public carrier. [facility] has no control over the quality or safety of the transportation that occurs as a result of the [employer]. [facility] cannot guarantee that any third-party transportation provider will complete any arranged transportation service. [facility] makes no representation, warranty, or guarantee regarding the reliability, timeliness, quality, safety, suitability, or availability of any of the Transport Services or that they will be error free. I fully understand that traveling by vehicle involves risks and dangers of serious bodily injury, including permanent disability, paralysis, and death. I agree, on behalf of myself and on behalf of any minor child using the Transport Services for whom I am the parent or legal guardian, that the entire risk arising out of my use of the [employer] remains solely with me, to the maximum extent permitted under applicable law. The [employer] are provided "as  is" and "as available." [facility] disclaims all representations and warranties, express, implied or statutory, not expressly set out in these terms, including the implied warranties of merchantability and fitness for a particular purpose. I hereby waive and release [facility], its agents, employees, officers, directors, representatives, insurers, attorneys, assigns, successors, subsidiaries, and affiliates from any and all past, present, or future claims, demands, liabilities, actions, causes of action, or suits of any kind directly or indirectly arising from acceptance and use of the [employer]. I further waive and release [facility] and its affiliates from all present and future liability and responsibility for any injury or death to persons or damages to property caused by or related to the use of the [employer]. I have read this Waiver and Release of Liability, and I understand the terms used in it and their legal significance. This Waiver is freely and voluntarily given with the understanding that my right (as well as the right of any minor child for whom I am the parent or legal guardian using the [employer]) to legal recourse against  in connection with the [employer] is knowingly surrendered in return for use of these services.   I attest that I read the consent document to [name], gave Ms. Rickles the opportunity to ask questions and answered the questions asked (if any). I affirm that [name] then provided consent for she's participation in this program.     Katy Apo

## 2021-09-11 DIAGNOSIS — N186 End stage renal disease: Secondary | ICD-10-CM | POA: Diagnosis not present

## 2021-09-11 DIAGNOSIS — Z992 Dependence on renal dialysis: Secondary | ICD-10-CM | POA: Diagnosis not present

## 2021-09-12 LAB — CULTURE, BLOOD (ROUTINE X 2)
Culture: NO GROWTH
Special Requests: ADEQUATE

## 2021-09-14 DIAGNOSIS — U071 COVID-19: Secondary | ICD-10-CM | POA: Diagnosis not present

## 2021-09-14 DIAGNOSIS — Z992 Dependence on renal dialysis: Secondary | ICD-10-CM | POA: Diagnosis not present

## 2021-09-14 DIAGNOSIS — N186 End stage renal disease: Secondary | ICD-10-CM | POA: Diagnosis not present

## 2021-09-15 ENCOUNTER — Encounter (INDEPENDENT_AMBULATORY_CARE_PROVIDER_SITE_OTHER): Payer: Medicare HMO

## 2021-09-15 ENCOUNTER — Ambulatory Visit (INDEPENDENT_AMBULATORY_CARE_PROVIDER_SITE_OTHER): Payer: Medicare HMO | Admitting: Nurse Practitioner

## 2021-09-16 DIAGNOSIS — N186 End stage renal disease: Secondary | ICD-10-CM | POA: Diagnosis not present

## 2021-09-16 DIAGNOSIS — Z992 Dependence on renal dialysis: Secondary | ICD-10-CM | POA: Diagnosis not present

## 2021-09-17 ENCOUNTER — Other Ambulatory Visit (HOSPITAL_COMMUNITY): Payer: Self-pay

## 2021-09-17 NOTE — Progress Notes (Signed)
Able to speak to [place].  She is still feeling bad from covid, she states headaches is the worse.  She is getting transportation to go to dialysis.  She has all her medications.   She states her building is on lock down due to several has covid.  She has everything for daily living and able to get groceries and items she needs.  She is aware of up coming appts.  She denies any chest pain or increased shortness of breath. Will continue to visit for heart failure.   [place] [phone]

## 2021-09-18 DIAGNOSIS — N186 End stage renal disease: Secondary | ICD-10-CM | POA: Diagnosis not present

## 2021-09-18 DIAGNOSIS — Z992 Dependence on renal dialysis: Secondary | ICD-10-CM | POA: Diagnosis not present

## 2021-09-21 DIAGNOSIS — Z992 Dependence on renal dialysis: Secondary | ICD-10-CM | POA: Diagnosis not present

## 2021-09-21 DIAGNOSIS — N186 End stage renal disease: Secondary | ICD-10-CM | POA: Diagnosis not present

## 2021-09-21 DIAGNOSIS — Z20822 Contact with and (suspected) exposure to covid-19: Secondary | ICD-10-CM | POA: Diagnosis not present

## 2021-09-22 DIAGNOSIS — I739 Peripheral vascular disease, unspecified: Secondary | ICD-10-CM | POA: Diagnosis not present

## 2021-09-22 DIAGNOSIS — Z992 Dependence on renal dialysis: Secondary | ICD-10-CM | POA: Diagnosis not present

## 2021-09-22 DIAGNOSIS — R1013 Epigastric pain: Secondary | ICD-10-CM | POA: Diagnosis not present

## 2021-09-22 DIAGNOSIS — K219 Gastro-esophageal reflux disease without esophagitis: Secondary | ICD-10-CM | POA: Diagnosis not present

## 2021-09-22 DIAGNOSIS — J449 Chronic obstructive pulmonary disease, unspecified: Secondary | ICD-10-CM | POA: Diagnosis not present

## 2021-09-22 DIAGNOSIS — N186 End stage renal disease: Secondary | ICD-10-CM | POA: Diagnosis not present

## 2021-09-22 DIAGNOSIS — R110 Nausea: Secondary | ICD-10-CM | POA: Diagnosis not present

## 2021-09-23 DIAGNOSIS — Z992 Dependence on renal dialysis: Secondary | ICD-10-CM | POA: Diagnosis not present

## 2021-09-23 DIAGNOSIS — N186 End stage renal disease: Secondary | ICD-10-CM | POA: Diagnosis not present

## 2021-09-25 DIAGNOSIS — Z992 Dependence on renal dialysis: Secondary | ICD-10-CM | POA: Diagnosis not present

## 2021-09-25 DIAGNOSIS — N186 End stage renal disease: Secondary | ICD-10-CM | POA: Diagnosis not present

## 2021-09-28 DIAGNOSIS — Z992 Dependence on renal dialysis: Secondary | ICD-10-CM | POA: Diagnosis not present

## 2021-09-28 DIAGNOSIS — N186 End stage renal disease: Secondary | ICD-10-CM | POA: Diagnosis not present

## 2021-09-30 DIAGNOSIS — Z992 Dependence on renal dialysis: Secondary | ICD-10-CM | POA: Diagnosis not present

## 2021-09-30 DIAGNOSIS — N186 End stage renal disease: Secondary | ICD-10-CM | POA: Diagnosis not present

## 2021-10-02 DIAGNOSIS — N186 End stage renal disease: Secondary | ICD-10-CM | POA: Diagnosis not present

## 2021-10-02 DIAGNOSIS — Z992 Dependence on renal dialysis: Secondary | ICD-10-CM | POA: Diagnosis not present

## 2021-10-05 DIAGNOSIS — Z992 Dependence on renal dialysis: Secondary | ICD-10-CM | POA: Diagnosis not present

## 2021-10-05 DIAGNOSIS — N186 End stage renal disease: Secondary | ICD-10-CM | POA: Diagnosis not present

## 2021-10-06 DIAGNOSIS — B3731 Acute candidiasis of vulva and vagina: Secondary | ICD-10-CM | POA: Diagnosis not present

## 2021-10-06 DIAGNOSIS — Z113 Encounter for screening for infections with a predominantly sexual mode of transmission: Secondary | ICD-10-CM | POA: Diagnosis not present

## 2021-10-06 DIAGNOSIS — Z114 Encounter for screening for human immunodeficiency virus [HIV]: Secondary | ICD-10-CM | POA: Diagnosis not present

## 2021-10-06 DIAGNOSIS — Z206 Contact with and (suspected) exposure to human immunodeficiency virus [HIV]: Secondary | ICD-10-CM | POA: Diagnosis not present

## 2021-10-06 DIAGNOSIS — Z7251 High risk heterosexual behavior: Secondary | ICD-10-CM | POA: Diagnosis not present

## 2021-10-07 ENCOUNTER — Other Ambulatory Visit (INDEPENDENT_AMBULATORY_CARE_PROVIDER_SITE_OTHER): Payer: Self-pay | Admitting: Nurse Practitioner

## 2021-10-07 DIAGNOSIS — Z992 Dependence on renal dialysis: Secondary | ICD-10-CM | POA: Diagnosis not present

## 2021-10-07 DIAGNOSIS — N186 End stage renal disease: Secondary | ICD-10-CM

## 2021-10-08 ENCOUNTER — Ambulatory Visit (INDEPENDENT_AMBULATORY_CARE_PROVIDER_SITE_OTHER): Payer: Medicare HMO | Admitting: Nurse Practitioner

## 2021-10-08 ENCOUNTER — Encounter (INDEPENDENT_AMBULATORY_CARE_PROVIDER_SITE_OTHER): Payer: Self-pay | Admitting: Nurse Practitioner

## 2021-10-08 ENCOUNTER — Other Ambulatory Visit: Payer: Self-pay

## 2021-10-08 ENCOUNTER — Ambulatory Visit (INDEPENDENT_AMBULATORY_CARE_PROVIDER_SITE_OTHER): Payer: Medicare HMO

## 2021-10-08 VITALS — BP 133/82 | HR 60 | Resp 15 | Wt 166.0 lb

## 2021-10-08 DIAGNOSIS — E1159 Type 2 diabetes mellitus with other circulatory complications: Secondary | ICD-10-CM | POA: Diagnosis not present

## 2021-10-08 DIAGNOSIS — E785 Hyperlipidemia, unspecified: Secondary | ICD-10-CM | POA: Diagnosis not present

## 2021-10-08 DIAGNOSIS — N186 End stage renal disease: Secondary | ICD-10-CM

## 2021-10-08 DIAGNOSIS — M79605 Pain in left leg: Secondary | ICD-10-CM | POA: Diagnosis not present

## 2021-10-08 DIAGNOSIS — M79604 Pain in right leg: Secondary | ICD-10-CM | POA: Diagnosis not present

## 2021-10-08 DIAGNOSIS — I152 Hypertension secondary to endocrine disorders: Secondary | ICD-10-CM

## 2021-10-08 DIAGNOSIS — E1169 Type 2 diabetes mellitus with other specified complication: Secondary | ICD-10-CM | POA: Diagnosis not present

## 2021-10-09 DIAGNOSIS — Z992 Dependence on renal dialysis: Secondary | ICD-10-CM | POA: Diagnosis not present

## 2021-10-09 DIAGNOSIS — N186 End stage renal disease: Secondary | ICD-10-CM | POA: Diagnosis not present

## 2021-10-12 DIAGNOSIS — Z794 Long term (current) use of insulin: Secondary | ICD-10-CM | POA: Diagnosis not present

## 2021-10-12 DIAGNOSIS — N186 End stage renal disease: Secondary | ICD-10-CM | POA: Diagnosis not present

## 2021-10-12 DIAGNOSIS — E119 Type 2 diabetes mellitus without complications: Secondary | ICD-10-CM | POA: Diagnosis not present

## 2021-10-12 DIAGNOSIS — Z992 Dependence on renal dialysis: Secondary | ICD-10-CM | POA: Diagnosis not present

## 2021-10-13 DIAGNOSIS — M2011 Hallux valgus (acquired), right foot: Secondary | ICD-10-CM | POA: Diagnosis not present

## 2021-10-13 DIAGNOSIS — M2012 Hallux valgus (acquired), left foot: Secondary | ICD-10-CM | POA: Diagnosis not present

## 2021-10-13 DIAGNOSIS — L600 Ingrowing nail: Secondary | ICD-10-CM | POA: Diagnosis not present

## 2021-10-13 DIAGNOSIS — Z794 Long term (current) use of insulin: Secondary | ICD-10-CM | POA: Diagnosis not present

## 2021-10-13 DIAGNOSIS — E1140 Type 2 diabetes mellitus with diabetic neuropathy, unspecified: Secondary | ICD-10-CM | POA: Diagnosis not present

## 2021-10-13 DIAGNOSIS — B351 Tinea unguium: Secondary | ICD-10-CM | POA: Diagnosis not present

## 2021-10-13 NOTE — Progress Notes (Signed)
Subjective:    Patient ID: Terry Arroyo, female    DOB: 03-24-1961, 60 y.o.   MRN: [id number] Chief Complaint  Patient presents with   Follow-up    Ultrasound follow up    Terry Arroyo is a 60 year old female that presents today for complaints of possible steal syndrome.  The patient notes that she has some pain and tingling in her hands during dialysis but notes that it is not significant.  She denies any open wounds or ulcerations.  She denies having to stop dialysis treatment for this.  The patient notes that her biggest complaint currently is tingling in her bilateral feet.  She notes that this is constant and worrisome for her.  She endorses what may be claudication-like symptoms.  She denies any fevers or chills or open wounds or ulcerations.  Today noninvasive studies show flow volume of 2549 in the right brachial axillary AV graft.  The studies also do not no evidence of steal syndrome.  Flow volume is consistent with previous studies.   Review of Systems  Neurological:  Positive for numbness.  Hematological:  Does not bruise/bleed easily.  All other systems reviewed and are negative.     Objective:   Physical Exam Vitals reviewed.  HENT:     Head: Normocephalic.  Cardiovascular:     Rate and Rhythm: Normal rate.     Pulses:          Radial pulses are 2+ on the right side.     Arteriovenous access: Right arteriovenous access is present.    Comments: Good thrill and bruit Pulmonary:     Effort: Pulmonary effort is normal.  Neurological:     Mental Status: She is alert and oriented to person, place, and time.     Motor: Weakness present.     Gait: Gait abnormal.  Psychiatric:        Mood and Affect: Mood normal.        Behavior: Behavior normal.        Thought Content: Thought content normal.        Judgment: Judgment normal.    BP 133/82 (BP Location: Left Arm)   Pulse 60   Resp 15   Wt 166 lb (75.3 kg)   BMI 26.79 kg/m   Past Medical History:   Diagnosis Date   Anemia    Angina at rest [facility])    Angioedema 03/20/2021   Lisinopril   Anterior cerebral aneurysm    approximately 5 mm; left MCA    Anxiety    Aortic atherosclerosis (HCC)    Asthma    Blind left eye    COPD (chronic obstructive pulmonary disease) (HCC)    Coronary artery disease    Depression    DOE (dyspnea on exertion)    ESRD (end stage renal disease) (HCC)    Gait instability    GERD (gastroesophageal reflux disease)    HFrEF (heart failure with reduced ejection fraction) ([place])    History of 2019 novel coronavirus disease (COVID-19) 03/2019   [facility] [place]   History of DVT (deep vein thrombosis)    Hx of CABG 2018   x 4; performed in [place]   Hyperlipidemia    Hypertension    MI (myocardial infarction) ([place]) 2018   no stents   Pituitary adenoma ([place])    Secondary hyperparathyroidism ([place])    Sleep apnea    non-compliant with nocturnal PAP therapy   Stroke ([place]) 2014  stated affected left side.   T2DM (type 2 diabetes mellitus) ([place])     Social History   Socioeconomic History   Marital status: Single    Spouse name: Not on file   Number of children: Not on file   Years of education: Not on file   Highest education level: Not on file  Occupational History    Comment: disabled  Tobacco Use   Smoking status: Former    Types: Cigarettes    Quit date: 2018    Years since quitting: 4.8   Smokeless tobacco: Never  Vaping Use   Vaping Use: Never used  Substance and Sexual Activity   Alcohol use: Not Currently   Drug use: Not Currently   Sexual activity: Not Currently  Other Topics Concern   Not on file  Social History Narrative   Patient lives alone.  Feels safe in her home.   Social Determinants of Health   Financial Resource Strain: Not on file  Food Insecurity: Not on file  Transportation Needs: Not on file  Physical Activity: Not on file  Stress: Not on file  Social Connections: Not on file  Intimate  Partner Violence: Not on file    Past Surgical History:  Procedure Laterality Date   AV FISTULA PLACEMENT Right 01/28/2021   Procedure: ARTERIOVENOUS (AV) FISTULA CREATION;  Surgeon: Katha Cabal, MD;  Location: [facility] ORS;  Service: Vascular;  Laterality: Right;   AV FISTULA PLACEMENT Right 05/13/2021   Procedure: INSERTION OF ARTERIOVENOUS (AV) GORE-TEX GRAFT ARM ( BRACHIAL AXILLARY );  Surgeon: Katha Cabal, MD;  Location: [facility] ORS;  Service: Vascular;  Laterality: Right;   [place]   pituitary tumor   BREAST EXCISIONAL BIOPSY Left yrs ago   benign   Fate  2018   x 4 vessels; performed in [place] N/A 11/07/2020   Procedure: DIALYSIS/PERMA CATHETER INSERTION;  Surgeon: Algernon Huxley, MD;  Location: [place] [facility];  Service: Cardiovascular;  Laterality: N/A;   DIALYSIS/PERMA CATHETER REMOVAL N/A 06/12/2021   Procedure: DIALYSIS/PERMA CATHETER REMOVAL;  Surgeon: Algernon Huxley, MD;  Location: [place] [facility];  Service: Cardiovascular;  Laterality: N/A;   EYE SURGERY Right 2021   cataracts    PERIPHERAL VASCULAR THROMBECTOMY Right 04/03/2021   Procedure: A/V Fistulagram ;  Surgeon: Katha Cabal, MD;  Location: [place] [facility];  Service: Cardiovascular;  Laterality: Right;    Family History  Problem Relation Age of Onset   CAD Mother    CAD Brother    Breast cancer Sister 4    Allergies  Allergen Reactions   Hydralazine Hives, Shortness Of Breath, Swelling and Rash    Body aches    Kiwi Extract Hives, Swelling and Rash    Mouth swells    Lisinopril Swelling    Angioedema   Shellfish Allergy Anaphylaxis    Shrimp/lobster  Betadine leaves welts on skin     Betadine [Povidone Iodine] Hives and Rash    LEAVES WELTS ON SKIN   Metformin Diarrhea and Other (See Comments)    GI Intolerance   Tape Itching    Use paper tape whenever possible    CBC Latest Ref  Rng & Units 09/09/2021 09/05/2021 06/15/2021  WBC 4.0 - 10.5 K/uL 5.1 7.8 6.3  Hemoglobin 12.0 - 15.0 g/dL 10.8(L) 12.9 7.7(L)  Hematocrit 36.0 - 46.0 % 33.5(L) 40.3 23.6(L)  Platelets  150 - 400 K/uL 186 241 236      CMP     Component Value Date/Time   NA 133 (L) 09/09/2021 0421   NA 134 (L) 11/18/2013 0138   K 4.1 09/09/2021 0421   K 3.8 11/18/2013 0138   CL 94 (L) 09/09/2021 0421   CL 98 11/18/2013 0138   CO2 25 09/09/2021 0421   CO2 29 11/18/2013 0138   GLUCOSE 222 (H) 09/09/2021 0421   GLUCOSE 348 (H) 11/18/2013 0138   BUN 77 (H) 09/09/2021 0421   BUN 11 11/18/2013 0138   CREATININE 6.66 (H) 09/09/2021 0421   CREATININE 0.87 11/18/2013 0138   CALCIUM 7.5 (L) 09/09/2021 0421   CALCIUM 9.4 11/18/2013 0138   PROT 6.6 01/28/2021 0717   ALBUMIN 2.6 (L) 01/28/2021 0717   AST 12 (L) 01/28/2021 0717   ALT 10 01/28/2021 0717   ALKPHOS 80 01/28/2021 0717   BILITOT 0.4 01/28/2021 0717   GFRNONAA 7 (L) 09/09/2021 0421   GFRNONAA >60 11/18/2013 0138   GFRAA 18 (L) 09/15/2020 0403   GFRAA >60 11/18/2013 0138     No results found.     Assessment & Plan:   1. ESRD (end stage renal disease) ([place]) Had discussion with the patient regarding her results.  Today noninvasive studies do not show evidence of steal syndrome.  We discussed sometimes a steal syndrome can be present although not seen on ultrasound.  We discussed fistulogram the patient notes that the symptoms do not severe and intolerable for her.  At this time she does not wish to undergo any intervention.  We will have the patient return in 6 months with noninvasive studies or sooner if issues should arise.  2. Hyperlipidemia associated with type 2 diabetes mellitus ([place]) Continue statin as ordered and reviewed, no changes at this time   3. Hypertension associated with diabetes ([place]) Continue antihypertensive medications as already ordered, these medications have been reviewed and there are no changes at this time.   4.  Pain in both lower extremities  Recommend:  The patient has atypical pain symptoms for pure atherosclerotic disease. However, on physical exam there is evidence of mixed venous and arterial disease, given the diminished pulses and the edema associated with venous changes of the legs.  Noninvasive studies including ABI's and venous ultrasound of the legs will be obtained and the patient will follow up with me to review these studies.  I suspect the patient is c/o pseudoclaudication.  Patient should have an evaluation of his LS spine which I defer to the primary service.This may also be related to neuropathy  The patient should continue walking and begin a more formal exercise program. The patient should continue his antiplatelet therapy and aggressive treatment of the lipid abnormalities.  The patient should begin wearing graduated compression socks 15-20 mmHg strength to control edema.     Current Outpatient Medications on File Prior to Visit  Medication Sig Dispense Refill   acetaminophen (TYLENOL) 500 MG tablet Take 500-1,000 mg by mouth every 6 (six) hours as needed for mild pain or moderate pain.     albuterol (VENTOLIN HFA) 108 (90 Base) MCG/ACT inhaler Inhale 2 puffs into the lungs every 6 (six) hours as needed for wheezing or shortness of breath.     amLODipine (NORVASC) 10 MG tablet Take 1 tablet (10 mg total) by mouth daily. 30 tablet 0   ammonium lactate (LAC-HYDRIN) 12 % lotion Apply 1 application topically daily.     aspirin 81  MG EC tablet Take 1 tablet (81 mg total) by mouth daily. 30 tablet 0   atorvastatin (LIPITOR) 80 MG tablet Take 1 tablet (80 mg total) by mouth daily at 6 PM. 30 tablet 0   bismuth subsalicylate (PEPTO BISMOL) 262 MG/15ML suspension Take 30 mLs by mouth every 6 (six) hours as needed for indigestion or diarrhea or loose stools.     Blood Glucose Monitoring Suppl (TRUE METRIX METER) w/Device KIT      carvedilol (COREG) 25 MG tablet Take 25 mg by mouth 2  (two) times daily.     cloNIDine (CATAPRES) 0.1 MG tablet Take 0.1 mg by mouth 2 (two) times daily. (Non-dialysis days)     furosemide (LASIX) 40 MG tablet Take 40 mg by mouth daily.     insulin aspart protamine- aspart (NOVOLOG MIX 70/30) (70-30) 100 UNIT/ML injection Inject 0.15 mLs (15 Units total) into the skin 2 (two) times daily with a meal. 10 mL 0   isosorbide mononitrate (IMDUR) 30 MG 24 hr tablet Take 30 mg by mouth daily.     loperamide (IMODIUM A-D) 2 MG tablet Take 4 mg by mouth 4 (four) times daily as needed for diarrhea or loose stools.     mirtazapine (REMERON) 30 MG tablet Take 1 tablet (30 mg total) by mouth at bedtime. 30 tablet 0   mometasone-formoterol (DULERA) 200-5 MCG/ACT AERO Inhale 2 puffs into the lungs 2 (two) times daily.     omeprazole (PRILOSEC) 40 MG capsule Take 40 mg by mouth daily.     polyethylene glycol (MIRALAX / GLYCOLAX) 17 g packet Take 17 g by mouth daily. 14 each 0   sucralfate (CARAFATE) 1 g tablet Take by mouth.     topiramate (TOPAMAX) 25 MG tablet Take 25 mg by mouth daily.     traZODone (DESYREL) 50 MG tablet Take 50 mg by mouth at bedtime.     TRUE METRIX BLOOD GLUCOSE TEST test strip      vitamin B-12 (CYANOCOBALAMIN) 1000 MCG tablet Take 1,000 mcg by mouth daily.     spironolactone (ALDACTONE) 25 MG tablet Take 1 tablet (25 mg total) by mouth daily. (Patient not taking: Reported on 10/08/2021) 30 tablet 0   No current facility-administered medications on file prior to visit.    There are no Patient Instructions on file for this visit. No follow-ups on file.   Kris Hartmann, NP

## 2021-10-14 ENCOUNTER — Encounter (INDEPENDENT_AMBULATORY_CARE_PROVIDER_SITE_OTHER): Payer: Self-pay | Admitting: Nurse Practitioner

## 2021-10-14 DIAGNOSIS — Z992 Dependence on renal dialysis: Secondary | ICD-10-CM | POA: Diagnosis not present

## 2021-10-14 DIAGNOSIS — N186 End stage renal disease: Secondary | ICD-10-CM | POA: Diagnosis not present

## 2021-10-15 ENCOUNTER — Other Ambulatory Visit (HOSPITAL_COMMUNITY): Payer: Self-pay

## 2021-10-15 NOTE — Progress Notes (Signed)
Today had a phone visit with Terry Arroyo.  She states she is still in the bed and has a bad headache since yesterday.  They have gotten her some topomax for it but not relieved yet.  She has been doing better, she has a procedure coming up and she is trying to find a way to get it done due to transportation and someone has to pick her up.  Discussed several options and made a plan for her.  She has all her medications and aware of how to take them.  She uses transportation to her dialysis appts.  She has been going to all her appts.  She is aware of up coming appts.  Appetite is good but not great.  She measures her fluids.  She denies any chest pain or increased shortness of breath.  She states swelling is good.  She states not in need of anything at this time.  She has everything for daily living.  She is from [place] and she discussed that she has more family support there than here.  But her Dad moved here and likes to be close to him.  She has no close friends here.  She is aware to call for any problems she may have.  Will continue to visit for heart failure, diet and medication compliance.   [place] [phone]

## 2021-10-16 DIAGNOSIS — Z992 Dependence on renal dialysis: Secondary | ICD-10-CM | POA: Diagnosis not present

## 2021-10-16 DIAGNOSIS — N186 End stage renal disease: Secondary | ICD-10-CM | POA: Diagnosis not present

## 2021-10-19 DIAGNOSIS — N186 End stage renal disease: Secondary | ICD-10-CM | POA: Diagnosis not present

## 2021-10-19 DIAGNOSIS — Z992 Dependence on renal dialysis: Secondary | ICD-10-CM | POA: Diagnosis not present

## 2021-10-21 DIAGNOSIS — Z992 Dependence on renal dialysis: Secondary | ICD-10-CM | POA: Diagnosis not present

## 2021-10-21 DIAGNOSIS — N186 End stage renal disease: Secondary | ICD-10-CM | POA: Diagnosis not present

## 2021-10-23 DIAGNOSIS — Z992 Dependence on renal dialysis: Secondary | ICD-10-CM | POA: Diagnosis not present

## 2021-10-23 DIAGNOSIS — N186 End stage renal disease: Secondary | ICD-10-CM | POA: Diagnosis not present

## 2021-10-26 DIAGNOSIS — N186 End stage renal disease: Secondary | ICD-10-CM | POA: Diagnosis not present

## 2021-10-26 DIAGNOSIS — R0602 Shortness of breath: Secondary | ICD-10-CM | POA: Diagnosis not present

## 2021-10-26 DIAGNOSIS — Z992 Dependence on renal dialysis: Secondary | ICD-10-CM | POA: Diagnosis not present

## 2021-10-26 DIAGNOSIS — R6883 Chills (without fever): Secondary | ICD-10-CM | POA: Diagnosis not present

## 2021-10-27 DIAGNOSIS — H43391 Other vitreous opacities, right eye: Secondary | ICD-10-CM | POA: Diagnosis not present

## 2021-10-27 DIAGNOSIS — H47292 Other optic atrophy, left eye: Secondary | ICD-10-CM | POA: Diagnosis not present

## 2021-10-27 DIAGNOSIS — E113393 Type 2 diabetes mellitus with moderate nonproliferative diabetic retinopathy without macular edema, bilateral: Secondary | ICD-10-CM | POA: Diagnosis not present

## 2021-10-27 DIAGNOSIS — Z0100 Encounter for examination of eyes and vision without abnormal findings: Secondary | ICD-10-CM | POA: Diagnosis not present

## 2021-10-28 ENCOUNTER — Emergency Department: Payer: Medicare HMO

## 2021-10-28 ENCOUNTER — Other Ambulatory Visit: Payer: Self-pay

## 2021-10-28 ENCOUNTER — Observation Stay
Admission: EM | Admit: 2021-10-28 | Discharge: 2021-10-29 | Disposition: A | Discharge status: Home / Self Care | Payer: Medicare HMO | Attending: Internal Medicine | Admitting: Internal Medicine

## 2021-10-28 DIAGNOSIS — K529 Noninfective gastroenteritis and colitis, unspecified: Secondary | ICD-10-CM

## 2021-10-28 DIAGNOSIS — R1084 Generalized abdominal pain: Secondary | ICD-10-CM | POA: Diagnosis not present

## 2021-10-28 DIAGNOSIS — Z8616 Personal history of COVID-19: Secondary | ICD-10-CM | POA: Insufficient documentation

## 2021-10-28 DIAGNOSIS — J101 Influenza due to other identified influenza virus with other respiratory manifestations: Secondary | ICD-10-CM

## 2021-10-28 DIAGNOSIS — I5043 Acute on chronic combined systolic (congestive) and diastolic (congestive) heart failure: Secondary | ICD-10-CM | POA: Diagnosis not present

## 2021-10-28 DIAGNOSIS — T783XXA Angioneurotic edema, initial encounter: Principal | ICD-10-CM | POA: Diagnosis present

## 2021-10-28 DIAGNOSIS — J449 Chronic obstructive pulmonary disease, unspecified: Secondary | ICD-10-CM | POA: Diagnosis not present

## 2021-10-28 DIAGNOSIS — E785 Hyperlipidemia, unspecified: Secondary | ICD-10-CM | POA: Diagnosis not present

## 2021-10-28 DIAGNOSIS — Z87891 Personal history of nicotine dependence: Secondary | ICD-10-CM | POA: Diagnosis not present

## 2021-10-28 DIAGNOSIS — Z79899 Other long term (current) drug therapy: Secondary | ICD-10-CM | POA: Insufficient documentation

## 2021-10-28 DIAGNOSIS — I10 Essential (primary) hypertension: Secondary | ICD-10-CM | POA: Diagnosis not present

## 2021-10-28 DIAGNOSIS — R509 Fever, unspecified: Secondary | ICD-10-CM | POA: Diagnosis not present

## 2021-10-28 DIAGNOSIS — I700 Atherosclerosis of aorta: Secondary | ICD-10-CM | POA: Diagnosis not present

## 2021-10-28 DIAGNOSIS — Z951 Presence of aortocoronary bypass graft: Secondary | ICD-10-CM | POA: Insufficient documentation

## 2021-10-28 DIAGNOSIS — E1142 Type 2 diabetes mellitus with diabetic polyneuropathy: Secondary | ICD-10-CM

## 2021-10-28 DIAGNOSIS — Z20822 Contact with and (suspected) exposure to covid-19: Secondary | ICD-10-CM | POA: Insufficient documentation

## 2021-10-28 DIAGNOSIS — T783XXD Angioneurotic edema, subsequent encounter: Secondary | ICD-10-CM

## 2021-10-28 DIAGNOSIS — I132 Hypertensive heart and chronic kidney disease with heart failure and with stage 5 chronic kidney disease, or end stage renal disease: Secondary | ICD-10-CM | POA: Insufficient documentation

## 2021-10-28 DIAGNOSIS — I2510 Atherosclerotic heart disease of native coronary artery without angina pectoris: Secondary | ICD-10-CM | POA: Insufficient documentation

## 2021-10-28 DIAGNOSIS — N2581 Secondary hyperparathyroidism of renal origin: Secondary | ICD-10-CM | POA: Diagnosis not present

## 2021-10-28 DIAGNOSIS — E1122 Type 2 diabetes mellitus with diabetic chronic kidney disease: Secondary | ICD-10-CM | POA: Insufficient documentation

## 2021-10-28 DIAGNOSIS — N186 End stage renal disease: Secondary | ICD-10-CM | POA: Insufficient documentation

## 2021-10-28 DIAGNOSIS — I5022 Chronic systolic (congestive) heart failure: Secondary | ICD-10-CM | POA: Diagnosis not present

## 2021-10-28 DIAGNOSIS — D631 Anemia in chronic kidney disease: Secondary | ICD-10-CM | POA: Diagnosis not present

## 2021-10-28 DIAGNOSIS — K5730 Diverticulosis of large intestine without perforation or abscess without bleeding: Secondary | ICD-10-CM | POA: Diagnosis not present

## 2021-10-28 DIAGNOSIS — Z7982 Long term (current) use of aspirin: Secondary | ICD-10-CM | POA: Insufficient documentation

## 2021-10-28 DIAGNOSIS — Z992 Dependence on renal dialysis: Secondary | ICD-10-CM | POA: Insufficient documentation

## 2021-10-28 DIAGNOSIS — K429 Umbilical hernia without obstruction or gangrene: Secondary | ICD-10-CM | POA: Diagnosis not present

## 2021-10-28 DIAGNOSIS — N261 Atrophy of kidney (terminal): Secondary | ICD-10-CM | POA: Diagnosis not present

## 2021-10-28 DIAGNOSIS — R52 Pain, unspecified: Secondary | ICD-10-CM | POA: Diagnosis not present

## 2021-10-28 LAB — CBC
HCT: 37.6 % (ref 36.0–46.0)
Hemoglobin: 12.1 g/dL (ref 12.0–15.0)
MCH: 28.5 pg (ref 26.0–34.0)
MCHC: 32.2 g/dL (ref 30.0–36.0)
MCV: 88.7 fL (ref 80.0–100.0)
Platelets: 184 10*3/uL (ref 150–400)
RBC: 4.24 MIL/uL (ref 3.87–5.11)
RDW: 15.6 % — ABNORMAL HIGH (ref 11.5–15.5)
WBC: 5 10*3/uL (ref 4.0–10.5)
nRBC: 0 % (ref 0.0–0.2)

## 2021-10-28 LAB — COMPREHENSIVE METABOLIC PANEL
ALT: 13 U/L (ref 0–44)
AST: 18 U/L (ref 15–41)
Albumin: 3.2 g/dL — ABNORMAL LOW (ref 3.5–5.0)
Alkaline Phosphatase: 64 U/L (ref 38–126)
Anion gap: 12 (ref 5–15)
BUN: 33 mg/dL — ABNORMAL HIGH (ref 6–20)
CO2: 23 mmol/L (ref 22–32)
Calcium: 8.4 mg/dL — ABNORMAL LOW (ref 8.9–10.3)
Chloride: 100 mmol/L (ref 98–111)
Creatinine, Ser: 6.27 mg/dL — ABNORMAL HIGH (ref 0.44–1.00)
GFR, Estimated: 7 mL/min — ABNORMAL LOW (ref >60)
Glucose, Bld: 182 mg/dL — ABNORMAL HIGH (ref 70–99)
Potassium: 3.1 mmol/L — ABNORMAL LOW (ref 3.5–5.1)
Sodium: 135 mmol/L (ref 135–145)
Total Bilirubin: 0.8 mg/dL (ref 0.3–1.2)
Total Protein: 6.8 g/dL (ref 6.5–8.1)

## 2021-10-28 LAB — RESP PANEL BY RT-PCR (FLU A&B, COVID) ARPGX2
Influenza A by PCR: POSITIVE — AB
Influenza B by PCR: NEGATIVE
SARS Coronavirus 2 by RT PCR: NEGATIVE

## 2021-10-28 LAB — LIPASE, BLOOD: Lipase: 42 U/L (ref 11–51)

## 2021-10-28 LAB — CBG MONITORING, ED
Glucose-Capillary: 248 mg/dL — ABNORMAL HIGH (ref 70–99)
Glucose-Capillary: 323 mg/dL — ABNORMAL HIGH (ref 70–99)

## 2021-10-28 LAB — GLUCOSE, CAPILLARY: Glucose-Capillary: 187 mg/dL — ABNORMAL HIGH (ref 70–99)

## 2021-10-28 IMAGING — CT CT ABD-PELV W/O CM
2 of 4 series · 16 of 46 positions shown, 18 images · non-contrast
Comparison: CT abdomen pelvis dated [DATE].

CLINICAL DATA: Abdominal pain.

EXAM:
CT ABDOMEN AND PELVIS WITHOUT CONTRAST
TECHNIQUE: Multidetector CT imaging of the abdomen and pelvis was performed
following the standard protocol without IV contrast.

[Series 2: routine abd/pel wo · axial · 0.79mm/px · z∈[-484,-89]mm · 13 of 87 slices shown, 15 images]
[im 4/87  soft-tissue]
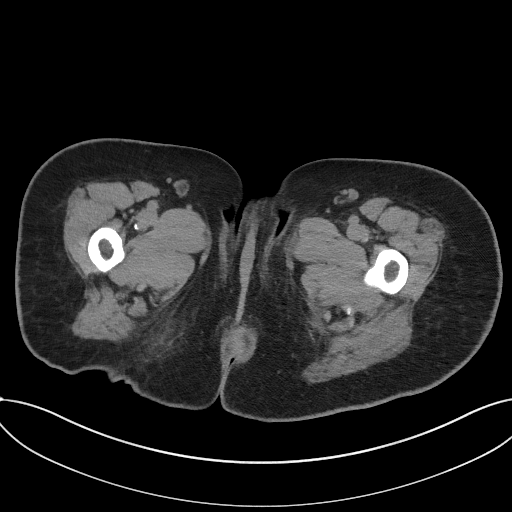
[im 4/87  bone]
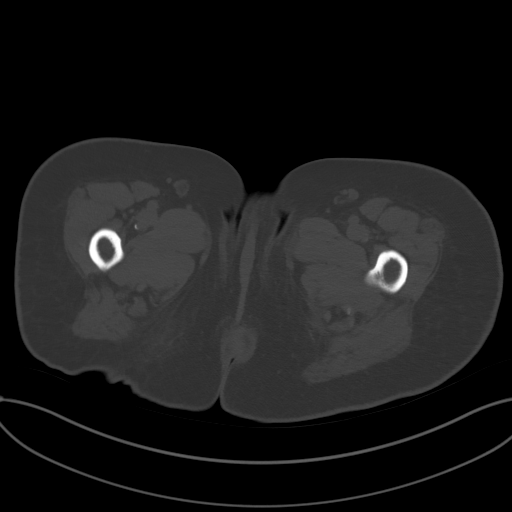
[im 12/87  soft-tissue]
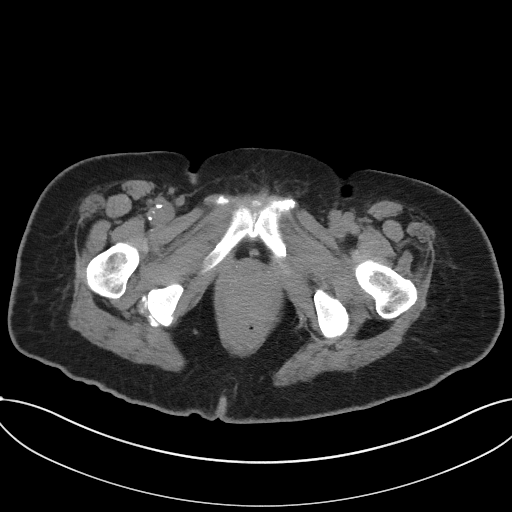
[im 20/87  soft-tissue]
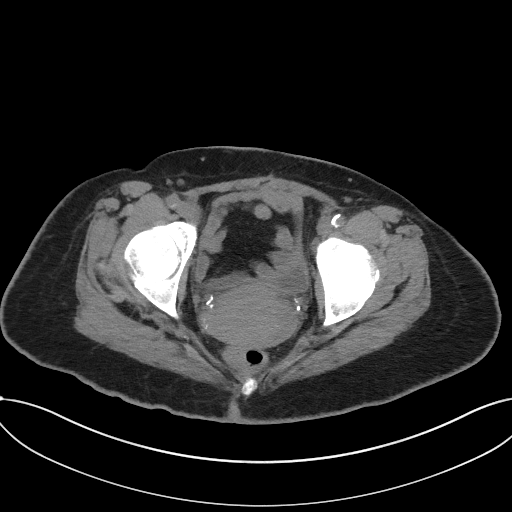
[im 24/87  soft-tissue]
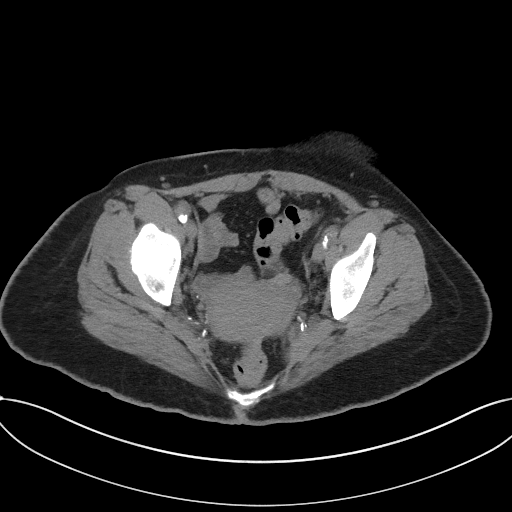
[im 32/87  soft-tissue]
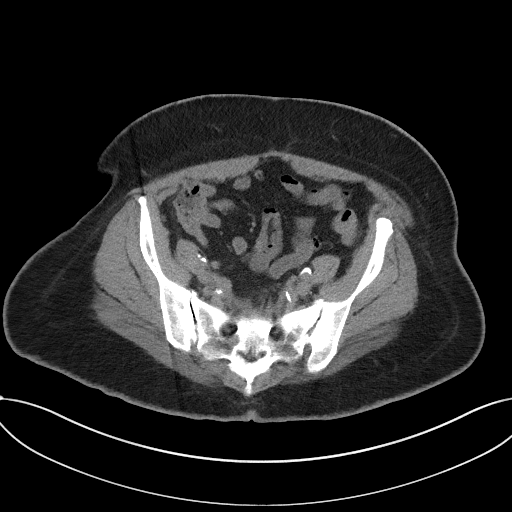
[im 36/87  soft-tissue]
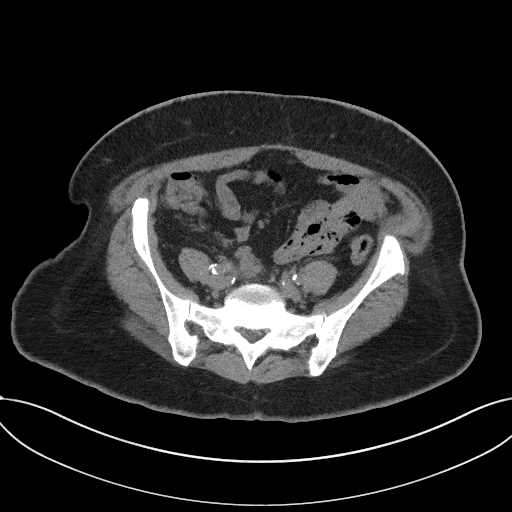
[im 44/87  soft-tissue]
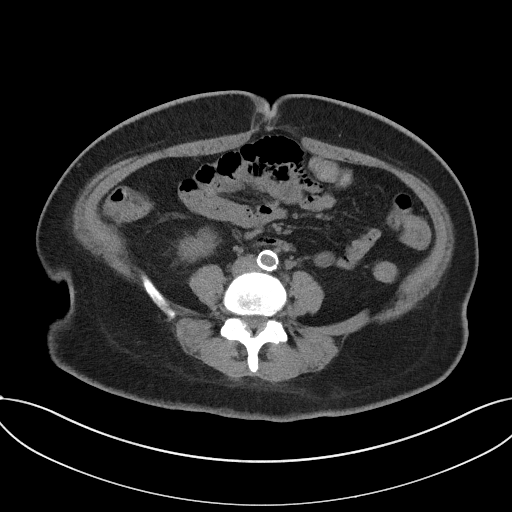
[im 51/87  soft-tissue]
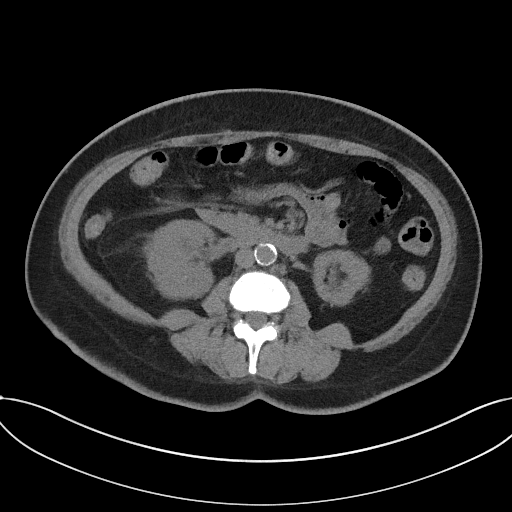
[im 55/87  soft-tissue]
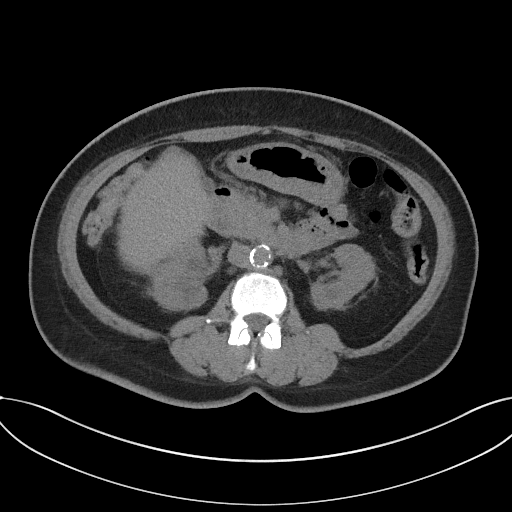
[im 55/87  bone]
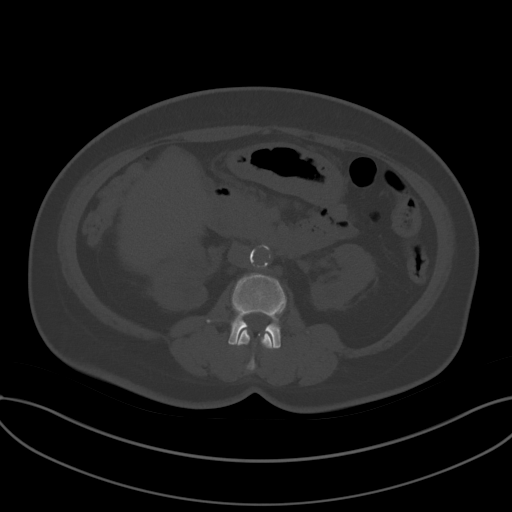
[im 63/87  soft-tissue]
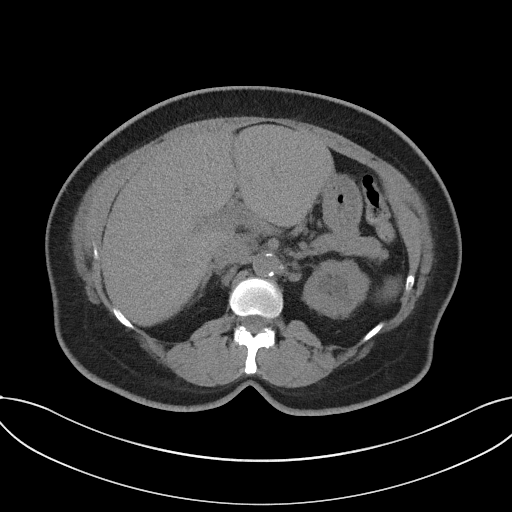
[im 67/87  soft-tissue]
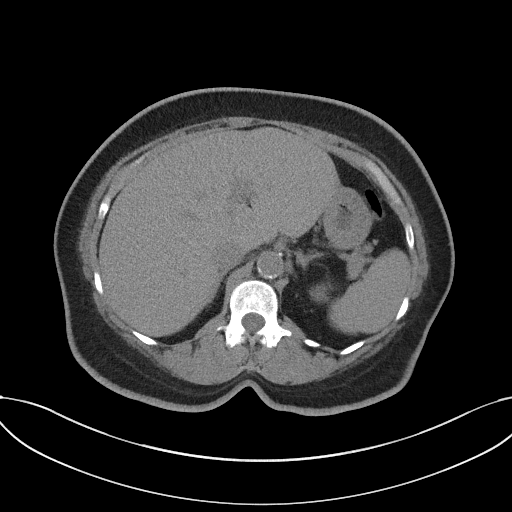
[im 75/87  soft-tissue]
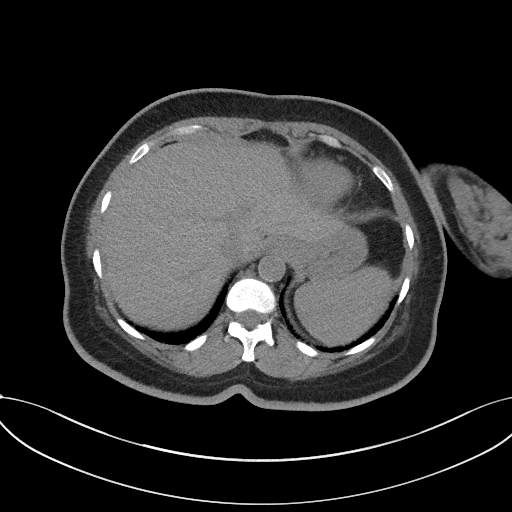
[im 83/87  soft-tissue]
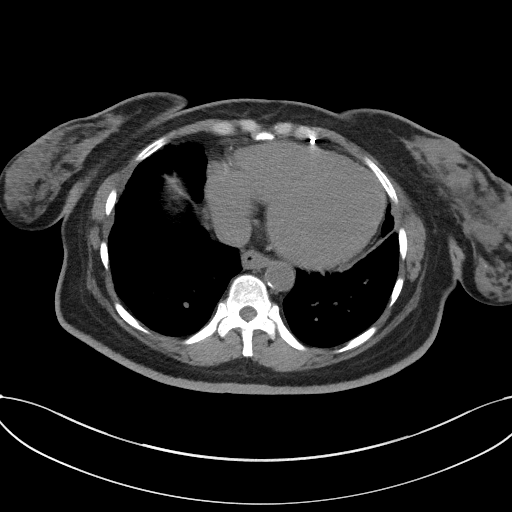

[Series 5: coronal st · coronal · 0.71mm/px · 3 of 86 slices shown]
[im 29/86  soft-tissue]
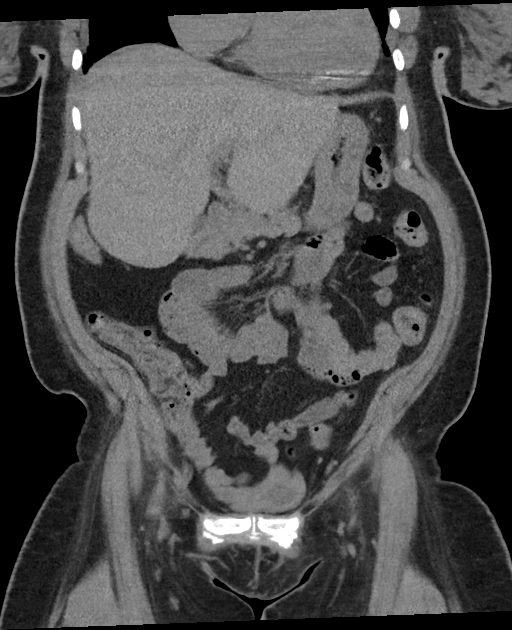
[im 38/86  soft-tissue]
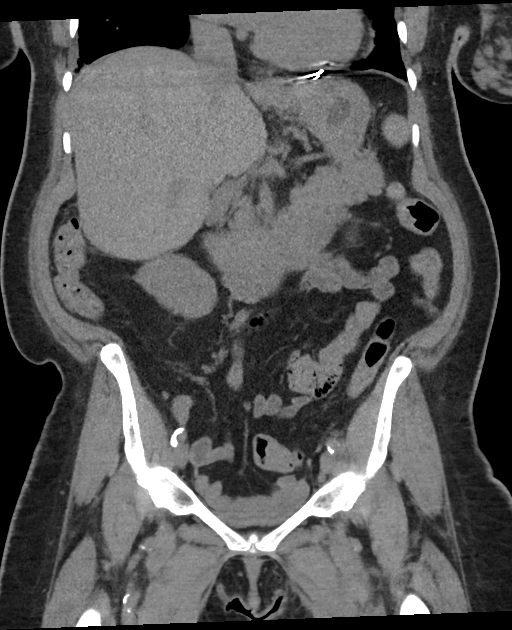
[im 48/86  soft-tissue]
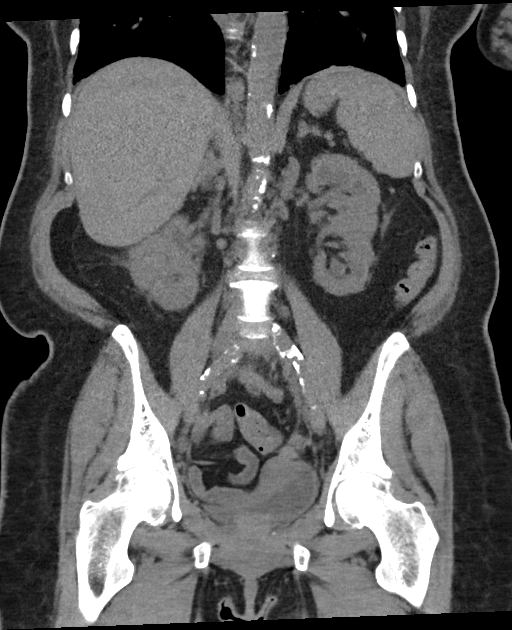

[16 of 46 positions shown; findings below may reference images not displayed]

FINDINGS: Evaluation of this exam is limited in the absence of intravenous
contrast.

Lower chest: The visualized lung bases are clear. There is
cardiomegaly.

No intra-abdominal free air or free fluid.

Hepatobiliary: The liver is unremarkable. No intrahepatic biliary
dilatation. The gallbladder is unremarkable

Pancreas: Unremarkable. No pancreatic ductal dilatation or
surrounding inflammatory changes.

Spleen: Normal in size without focal abnormality.

Adrenals/Urinary Tract: The adrenal glands unremarkable. Moderate
bilateral renal parenchyma atrophy. There is no hydronephrosis or
nephrolithiasis on either side. Ill-defined bilateral renal
hypodense lesions are not characterized on this CT. Mild bilateral
perinephric stranding. Correlation with urinalysis recommended to
exclude UTI. The visualized ureters appear unremarkable. The urinary
bladder is collapsed.

Stomach/Bowel: There is sigmoid diverticulosis without active
inflammatory changes. There is no bowel obstruction or active
inflammation. The appendix is normal.

Vascular/Lymphatic: Advanced aortoiliac atherosclerotic disease. The
IVC is unremarkable. No portal venous gas. There is no adenopathy.

Reproductive: The uterus is grossly unremarkable.

Other: Small fat containing umbilical hernia.

Musculoskeletal: No acute or significant osseous findings.
IMPRESSION: 1. No acute intra-abdominal or pelvic pathology. No hydronephrosis
or nephrolithiasis.
2. Sigmoid diverticulosis. No bowel obstruction. Normal appendix.
3. Aortic Atherosclerosis ([O0]-[O0]).

## 2021-10-28 MED ORDER — HEPARIN SODIUM (PORCINE) 1000 UNIT/ML DIALYSIS: 1000.0000 [IU] | INTRAMUSCULAR | Status: DC | PRN

## 2021-10-28 MED ORDER — SODIUM CHLORIDE 0.9 % IV SOLN: 100.0000 mL | INTRAVENOUS | Status: DC | PRN

## 2021-10-28 MED ORDER — METHYLPREDNISOLONE SODIUM SUCC 125 MG IJ SOLR
120.0000 mg | INTRAMUSCULAR | Status: DC
  Administered 2021-10-29: 120 mg via INTRAVENOUS
  Filled 2021-10-28: qty 2

## 2021-10-28 MED ORDER — OSELTAMIVIR PHOSPHATE 30 MG PO CAPS
30.0000 mg | ORAL_CAPSULE | Freq: Every day | ORAL | Status: DC
  Administered 2021-10-28 (×2): 30 mg via ORAL
  Filled 2021-10-28 (×4): qty 1

## 2021-10-28 MED ORDER — LIDOCAINE-PRILOCAINE 2.5-2.5 % EX CREA
1.0000 "application " | TOPICAL_CREAM | CUTANEOUS | Status: DC | PRN
  Filled 2021-10-28: qty 5

## 2021-10-28 MED ORDER — ATORVASTATIN CALCIUM 20 MG PO TABS
80.0000 mg | ORAL_TABLET | Freq: Every day | ORAL | Status: DC
  Administered 2021-10-28: 80 mg via ORAL
  Filled 2021-10-28: qty 4

## 2021-10-28 MED ORDER — OSELTAMIVIR PHOSPHATE 75 MG PO CAPS: 75.0000 mg | ORAL_CAPSULE | Freq: Two times a day (BID) | ORAL | Status: DC

## 2021-10-28 MED ORDER — INSULIN ASPART PROT & ASPART (70-30 MIX) 100 UNIT/ML ~~LOC~~ SUSP
15.0000 [IU] | Freq: Two times a day (BID) | SUBCUTANEOUS | Status: DC
  Administered 2021-10-28 – 2021-10-29 (×2): 15 [IU] via SUBCUTANEOUS
  Filled 2021-10-28 (×2): qty 10

## 2021-10-28 MED ORDER — ONDANSETRON HCL 4 MG PO TABS
4.0000 mg | ORAL_TABLET | Freq: Four times a day (QID) | ORAL | Status: DC | PRN
  Administered 2021-10-28: 4 mg via ORAL
  Filled 2021-10-28: qty 1

## 2021-10-28 MED ORDER — ONDANSETRON HCL 4 MG/2ML IJ SOLN: 4.0000 mg | Freq: Four times a day (QID) | INTRAMUSCULAR | Status: DC | PRN

## 2021-10-28 MED ORDER — FUROSEMIDE 40 MG PO TABS
40.0000 mg | ORAL_TABLET | Freq: Every day | ORAL | Status: DC
  Administered 2021-10-28 – 2021-10-29 (×2): 40 mg via ORAL
  Filled 2021-10-28 (×2): qty 1

## 2021-10-28 MED ORDER — TRAZODONE HCL 50 MG PO TABS
50.0000 mg | ORAL_TABLET | Freq: Every day | ORAL | Status: DC
  Administered 2021-10-28: 50 mg via ORAL
  Filled 2021-10-28: qty 1

## 2021-10-28 MED ORDER — PANTOPRAZOLE SODIUM 40 MG IV SOLR
40.0000 mg | Freq: Once | INTRAVENOUS | Status: AC
  Administered 2021-10-28: 40 mg via INTRAVENOUS
  Filled 2021-10-28: qty 40

## 2021-10-28 MED ORDER — ALTEPLASE 2 MG IJ SOLR: 2.0000 mg | Freq: Once | INTRAMUSCULAR | Status: DC | PRN

## 2021-10-28 MED ORDER — MIRTAZAPINE 15 MG PO TABS
30.0000 mg | ORAL_TABLET | Freq: Every day | ORAL | Status: DC
  Administered 2021-10-28: 30 mg via ORAL
  Filled 2021-10-28: qty 2

## 2021-10-28 MED ORDER — IPRATROPIUM-ALBUTEROL 0.5-2.5 (3) MG/3ML IN SOLN
3.0000 mL | Freq: Once | RESPIRATORY_TRACT | Status: AC
  Administered 2021-10-28: 3 mL via RESPIRATORY_TRACT
  Filled 2021-10-28: qty 3

## 2021-10-28 MED ORDER — HEPARIN SODIUM (PORCINE) 5000 UNIT/ML IJ SOLN
5000.0000 [IU] | Freq: Three times a day (TID) | INTRAMUSCULAR | Status: DC
  Administered 2021-10-28 – 2021-10-29 (×4): 5000 [IU] via SUBCUTANEOUS
  Filled 2021-10-28 (×4): qty 1

## 2021-10-28 MED ORDER — TRAZODONE HCL 50 MG PO TABS: 25.0000 mg | ORAL_TABLET | Freq: Every evening | ORAL | Status: DC | PRN

## 2021-10-28 MED ORDER — POLYETHYLENE GLYCOL 3350 17 G PO PACK
17.0000 g | PACK | Freq: Every day | ORAL | Status: DC
  Filled 2021-10-28 (×2): qty 1

## 2021-10-28 MED ORDER — SUCRALFATE 1 G PO TABS
1.0000 g | ORAL_TABLET | Freq: Two times a day (BID) | ORAL | Status: DC
  Administered 2021-10-28: 1 g via ORAL
  Filled 2021-10-28: qty 1

## 2021-10-28 MED ORDER — BISMUTH SUBSALICYLATE 262 MG/15ML PO SUSP
30.0000 mL | Freq: Four times a day (QID) | ORAL | Status: DC | PRN
  Filled 2021-10-28: qty 118

## 2021-10-28 MED ORDER — FAMOTIDINE IN NACL 20-0.9 MG/50ML-% IV SOLN
20.0000 mg | Freq: Two times a day (BID) | INTRAVENOUS | Status: DC
  Administered 2021-10-28 – 2021-10-29 (×3): 20 mg via INTRAVENOUS
  Filled 2021-10-28 (×4): qty 50

## 2021-10-28 MED ORDER — ASPIRIN EC 81 MG PO TBEC
81.0000 mg | DELAYED_RELEASE_TABLET | Freq: Every day | ORAL | Status: DC
  Administered 2021-10-28 – 2021-10-29 (×2): 81 mg via ORAL
  Filled 2021-10-28 (×2): qty 1

## 2021-10-28 MED ORDER — AMLODIPINE BESYLATE 10 MG PO TABS
10.0000 mg | ORAL_TABLET | Freq: Every day | ORAL | Status: DC
  Administered 2021-10-28 – 2021-10-29 (×2): 10 mg via ORAL
  Filled 2021-10-28 (×2): qty 1

## 2021-10-28 MED ORDER — LOPERAMIDE HCL 2 MG PO CAPS: 4.0000 mg | ORAL_CAPSULE | Freq: Four times a day (QID) | ORAL | Status: DC | PRN

## 2021-10-28 MED ORDER — MORPHINE SULFATE (PF) 4 MG/ML IV SOLN
4.0000 mg | Freq: Once | INTRAVENOUS | Status: AC
  Administered 2021-10-28: 4 mg via INTRAVENOUS
  Filled 2021-10-28: qty 1

## 2021-10-28 MED ORDER — ALBUTEROL SULFATE (2.5 MG/3ML) 0.083% IN NEBU
2.5000 mg | INHALATION_SOLUTION | Freq: Four times a day (QID) | RESPIRATORY_TRACT | Status: DC | PRN
Start: 2021-10-28 — End: 2021-10-29
  Administered 2021-10-29: 2.5 mg via RESPIRATORY_TRACT
  Filled 2021-10-28: qty 3

## 2021-10-28 MED ORDER — CARVEDILOL 25 MG PO TABS
25.0000 mg | ORAL_TABLET | Freq: Two times a day (BID) | ORAL | Status: DC
  Administered 2021-10-28 – 2021-10-29 (×2): 25 mg via ORAL
  Filled 2021-10-28 (×2): qty 1

## 2021-10-28 MED ORDER — DIPHENHYDRAMINE HCL 50 MG/ML IJ SOLN
25.0000 mg | Freq: Four times a day (QID) | INTRAMUSCULAR | Status: DC
  Administered 2021-10-28 – 2021-10-29 (×5): 25 mg via INTRAVENOUS
  Filled 2021-10-28 (×5): qty 1

## 2021-10-28 MED ORDER — CHLORHEXIDINE GLUCONATE CLOTH 2 % EX PADS
6.0000 | MEDICATED_PAD | Freq: Every day | CUTANEOUS | Status: DC
  Administered 2021-10-28 – 2021-10-29 (×2): 6 via TOPICAL
  Filled 2021-10-28: qty 6

## 2021-10-28 MED ORDER — DIPHENHYDRAMINE HCL 50 MG/ML IJ SOLN
25.0000 mg | Freq: Once | INTRAMUSCULAR | Status: AC
  Administered 2021-10-28: 25 mg via INTRAVENOUS
  Filled 2021-10-28: qty 1

## 2021-10-28 MED ORDER — LIDOCAINE HCL (PF) 1 % IJ SOLN
5.0000 mL | INTRAMUSCULAR | Status: DC | PRN
  Filled 2021-10-28: qty 5

## 2021-10-28 MED ORDER — INSULIN ASPART 100 UNIT/ML IJ SOLN
0.0000 [IU] | Freq: Three times a day (TID) | INTRAMUSCULAR | Status: DC
  Administered 2021-10-28: 11 [IU] via SUBCUTANEOUS
  Administered 2021-10-28: 5 [IU] via SUBCUTANEOUS
  Administered 2021-10-28: 3 [IU] via SUBCUTANEOUS
  Administered 2021-10-29 (×2): 8 [IU] via SUBCUTANEOUS
  Filled 2021-10-28 (×5): qty 1

## 2021-10-28 MED ORDER — PANTOPRAZOLE SODIUM 40 MG PO TBEC
40.0000 mg | DELAYED_RELEASE_TABLET | Freq: Every day | ORAL | Status: DC
  Administered 2021-10-28 – 2021-10-29 (×2): 40 mg via ORAL
  Filled 2021-10-28 (×2): qty 1

## 2021-10-28 MED ORDER — ACETAMINOPHEN 325 MG PO TABS
650.0000 mg | ORAL_TABLET | Freq: Four times a day (QID) | ORAL | Status: DC | PRN
  Administered 2021-10-28: 650 mg via ORAL
  Filled 2021-10-28: qty 2

## 2021-10-28 MED ORDER — PENTAFLUOROPROP-TETRAFLUOROETH EX AERO
1.0000 "application " | INHALATION_SPRAY | CUTANEOUS | Status: DC | PRN
  Filled 2021-10-28: qty 30

## 2021-10-28 MED ORDER — MAGNESIUM HYDROXIDE 400 MG/5ML PO SUSP: 30.0000 mL | Freq: Every day | ORAL | Status: DC | PRN

## 2021-10-28 MED ORDER — MOMETASONE FURO-FORMOTEROL FUM 200-5 MCG/ACT IN AERO
2.0000 | INHALATION_SPRAY | Freq: Two times a day (BID) | RESPIRATORY_TRACT | Status: DC
  Administered 2021-10-28 – 2021-10-29 (×3): 2 via RESPIRATORY_TRACT
  Filled 2021-10-28: qty 8.8

## 2021-10-28 MED ORDER — TOPIRAMATE 25 MG PO TABS
25.0000 mg | ORAL_TABLET | Freq: Every day | ORAL | Status: DC
  Administered 2021-10-28 – 2021-10-29 (×2): 25 mg via ORAL
  Filled 2021-10-28 (×2): qty 1

## 2021-10-28 MED ORDER — CLONIDINE HCL 0.1 MG PO TABS
0.1000 mg | ORAL_TABLET | Freq: Two times a day (BID) | ORAL | Status: DC
  Administered 2021-10-28 – 2021-10-29 (×3): 0.1 mg via ORAL
  Filled 2021-10-28 (×3): qty 1

## 2021-10-28 MED ORDER — METHYLPREDNISOLONE SODIUM SUCC 125 MG IJ SOLR
125.0000 mg | Freq: Once | INTRAMUSCULAR | Status: AC
  Administered 2021-10-28: 125 mg via INTRAVENOUS
  Filled 2021-10-28: qty 2

## 2021-10-28 MED ORDER — VITAMIN B-12 1000 MCG PO TABS
1000.0000 ug | ORAL_TABLET | Freq: Every day | ORAL | Status: DC
  Administered 2021-10-28 – 2021-10-29 (×2): 1000 ug via ORAL
  Filled 2021-10-28 (×3): qty 1

## 2021-10-28 MED ORDER — AMMONIUM LACTATE 12 % EX LOTN
1.0000 "application " | TOPICAL_LOTION | Freq: Every day | CUTANEOUS | Status: DC | PRN
  Filled 2021-10-28: qty 400

## 2021-10-28 MED ORDER — SODIUM CHLORIDE 0.9 % IV SOLN: INTRAVENOUS | Status: DC

## 2021-10-28 MED ORDER — ISOSORBIDE MONONITRATE ER 30 MG PO TB24
30.0000 mg | ORAL_TABLET | Freq: Every day | ORAL | Status: DC
  Administered 2021-10-28 – 2021-10-29 (×2): 30 mg via ORAL
  Filled 2021-10-28 (×2): qty 1

## 2021-10-28 MED ORDER — ACETAMINOPHEN 650 MG RE SUPP
650.0000 mg | Freq: Four times a day (QID) | RECTAL | Status: DC | PRN
  Filled 2021-10-28: qty 1

## 2021-10-28 NOTE — ED Notes (Signed)
Pt in CT.

## 2021-10-28 NOTE — ED Triage Notes (Addendum)
Pt states that she has been having abd pain for over a month, pt was seen at [facility] 10/4 and given carafate and told that she may have stomach ulcers. Pt has follow up apt with GI coming up but states pain got worse tonight. Pt had 1 episode of emesis. Pt is a dialysis pt, had it last on Monday, supposed to go later today. Pt noted to have swollen top lip, pt denies taking lisinopril anymore. Pt states she is having some difficulty breathing denies difficulty swallowing

## 2021-10-28 NOTE — Progress Notes (Signed)
Patient ID: Terry Arroyo, female   DOB: 22-Jan-1961, 60 y.o.   MRN: [id number] This is a no charge note as patient was admitted this AM.  Patient seen and examined and H&P reviewed. Terry Arroyo is a 60 y.o. African-American female with medical history significant for COPD, end-stage renal disease on hemodialysis, HFrEF, hypertension, dyslipidemia, type 2 diabetes mellitus and depression, presented to the ER with acute onset of upper lip swelling throughout the day yesterday as well as abdominal pain with associated nausea, vomiting and diarrhea.  She denies any tongue swelling or neck swelling.  She was having wheezing helped by DuoNebs but denies any significant dyspnea or cough.  No chest pain or palpitations.  Not missed hemodialysis.  She had angioedema in the past and lisinopril was stopped about a year ago.   Influenza A came back positive and influenza B and COVID-19 came back negative.  She reports having mild sob. No other issues.  EOMI, lips swollen. Cta no w/r Regular s1/s2 no gallop  A/P Continue tamaflu Continue benadryl and steroid Hold sucralfate as she tells me this is new

## 2021-10-28 NOTE — ED Provider Notes (Signed)
[facility] Emergency Department Provider Note ____________________________________________   Event Date/Time   First MD Initiated Contact with Patient 10/28/21 [phone]     (approximate)  I have reviewed the triage vital signs and the nursing notes.  HISTORY  Chief Complaint Abdominal Pain   HPI Terry Arroyo is a 60 y.o. femalewho presents to the ED for evaluation of abd pain.   Chart review indicates ESRD pt w hx of angioedema no longer on lisinopril.   Patient presents to the ED for evaluation of abdominal pain and upper lip swelling throughout the day today.  She reports awakening Tuesday morning with both of these symptoms and been persistent and progressive throughout the day.  Due to her abdominal pain, she presents to the ED for evaluation.  She reports moderately severe abdominal pain, not improved with home Tylenol, that is diffuse.  She reports nausea without emesis.  Denies diarrhea.  Denies chest pain, shortness of breath or cough.  Denies fever.  Does report upper lip swelling of the same timeframe.  Denies sore throat.  Past Medical History:  Diagnosis Date   Anemia    Angina at rest [facility])    Angioedema 03/20/2021   Lisinopril   Anterior cerebral aneurysm    approximately 5 mm; left MCA    Anxiety    Aortic atherosclerosis (HCC)    Asthma    Blind left eye    COPD (chronic obstructive pulmonary disease) (HCC)    Coronary artery disease    Depression    DOE (dyspnea on exertion)    ESRD (end stage renal disease) (HCC)    Gait instability    GERD (gastroesophageal reflux disease)    HFrEF (heart failure with reduced ejection fraction) ([place])    History of 2019 novel coronavirus disease (COVID-19) 03/2019   [place]   History of DVT (deep vein thrombosis)    Hx of CABG 2018   x 4; performed in [place]   Hyperlipidemia    Hypertension    MI (myocardial infarction) ([place]) 2018   no stents   Pituitary  adenoma ([place])    Secondary hyperparathyroidism ([place])    Sleep apnea    non-compliant with nocturnal PAP therapy   Stroke [facility]) 2014   stated affected left side.   T2DM (type 2 diabetes mellitus) [facility])     Patient Active Problem List   Diagnosis Date Noted   Lactic acidosis    Diabetic polyneuropathy associated with type 2 diabetes mellitus ([place])    Sepsis ([place]) 09/05/2021   Acute CHF (congestive heart failure) ([place]) 09/05/2021   Fluid overload 06/11/2021   Hypokalemia 06/11/2021   Acute pulmonary edema (HCC)    COPD (chronic obstructive pulmonary disease) with emphysema ([place]) 02/09/2021   Chronic sphenoidal sinusitis 01/01/2021   Contact dermatitis and other eczema due to other specified agent 01/01/2021   Hydronephrosis, left 01/01/2021   Other diseases of nasal cavity and sinuses(478.19) 01/01/2021   ESRD (end stage renal disease) ([place])    Reactive thrombocytosis 11/08/2020   Nephrotic syndrome 11/04/2020   SOB (shortness of breath) 11/01/2020   Acute on chronic combined systolic and diastolic CHF (congestive heart failure) ([place]) 10/21/2020   CAD (coronary artery disease) 10/21/2020   Hyperlipidemia associated with type 2 diabetes mellitus ([place]) 10/21/2020   HFrEF (heart failure with reduced ejection fraction) ([place])    Acute respiratory distress    COPD exacerbation ([place]) 10/10/2020   Kidney hematoma 09/13/2020  Essential hypertension    Acute renal failure superimposed on stage 4 chronic kidney disease (HCC)    Hematuria 09/12/2020   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[place]) 09/03/2020   Blind left eye 09/03/2020   Brain aneurysm 09/03/2020   Esotropia 09/03/2020   Lesion of ulnar nerve 09/03/2020   Mixed hyperlipidemia 09/03/2020   Sensory hearing loss, bilateral 09/03/2020   Tear film insufficiency 09/03/2020   Health maintenance examination 06/27/2020   Secondary hyperparathyroidism of renal origin ([place]) 04/24/2020   Anemia in chronic  kidney disease 03/10/2020   Benign hypertensive kidney disease with chronic kidney disease 03/10/2020   Hyposmolality and/or hyponatremia 03/10/2020   Stage 3b chronic kidney disease ([place]) 03/10/2020   Calf tenderness    Acute on chronic heart failure ([place]) 02/27/2020   Leg edema    Type 2 diabetes mellitus with hyperlipidemia ([place]) 02/26/2020   CKD (chronic kidney disease) stage 4, GFR 15-29 ml/min (HCC) 02/26/2020   Hyponatremia 02/26/2020   Anasarca 02/26/2020   Proteinuria 02/26/2020   Hypoalbuminemia 02/26/2020   Elevated troponin 02/26/2020   Acute on chronic systolic CHF (congestive heart failure) ([place]) 02/26/2020   Aortic atherosclerosis ([place]) 02/18/2020   Hyperglycemia    Nausea    Hypertensive emergency    Hypertensive urgency 11/24/2019   Chest pain 11/23/2019   OSA (obstructive sleep apnea) 04/10/2019   Mild episode of recurrent major depressive disorder ([place]) 04/10/2019   ACS (acute coronary syndrome) ([place]) 03/24/2019   COVID-19 virus infection 03/24/2019   Hypertensive heart/kidney disease w/chronic kidney disease stage III ([place]) 10/21/2017   Mild nonproliferative diabetic retinopathy of right eye associated with type 2 diabetes mellitus ([place]) 09/09/2017   Genital herpes 05/07/2017   DVT (deep venous thrombosis) ([place]) 12/30/2016   PVD (posterior vitreous detachment), right eye 01/09/2016   Diabetic peripheral angiopathy ([place]) 01/03/2016   Age-related nuclear cataract of both eyes 10/24/2015   Chronic nonintractable headache 10/02/2015   Dry eyes, bilateral 02/13/2015   Proptosis 12/18/2014   Iritis of right eye 12/03/2014   Anemia 08/30/2014   Depression 08/30/2014    Past Surgical History:  Procedure Laterality Date   AV FISTULA PLACEMENT Right 01/28/2021   Procedure: ARTERIOVENOUS (AV) FISTULA CREATION;  Surgeon: Katha Cabal, MD;  Location: [facility] ORS;  Service: Vascular;  Laterality: Right;   AV FISTULA PLACEMENT Right 05/13/2021   Procedure: INSERTION  OF ARTERIOVENOUS (AV) GORE-TEX GRAFT ARM ( BRACHIAL AXILLARY );  Surgeon: Katha Cabal, MD;  Location: [facility] ORS;  Service: Vascular;  Laterality: Right;   [place]   pituitary tumor   BREAST EXCISIONAL BIOPSY Left yrs ago   benign   CESAREAN SECTION     CORONARY ARTERY BYPASS GRAFT  2018   x 4 vessels; performed in [place] N/A 11/07/2020   Procedure: DIALYSIS/PERMA CATHETER INSERTION;  Surgeon: Algernon Huxley, MD;  Location: [place] CV LAB;  Service: Cardiovascular;  Laterality: N/A;   DIALYSIS/PERMA CATHETER REMOVAL N/A 06/12/2021   Procedure: DIALYSIS/PERMA CATHETER REMOVAL;  Surgeon: Algernon Huxley, MD;  Location: [place] CV LAB;  Service: Cardiovascular;  Laterality: N/A;   EYE SURGERY Right 2021   cataracts    PERIPHERAL VASCULAR THROMBECTOMY Right 04/03/2021   Procedure: A/V Fistulagram ;  Surgeon: Katha Cabal, MD;  Location: [place] [facility] LAB;  Service: Cardiovascular;  Laterality: Right;    Prior to Admission medications   Medication Sig Start Date End Date Taking? Authorizing Provider  acetaminophen (TYLENOL) 500 MG tablet Take 500-1,000 mg by mouth every 6 (six) hours as needed for mild pain or moderate pain.    [provider]  albuterol (VENTOLIN HFA) 108 (90 Base) MCG/ACT inhaler Inhale 2 puffs into the lungs every 6 (six) hours as needed for wheezing or shortness of breath.    [provider]  amLODipine (NORVASC) 10 MG tablet Take 1 tablet (10 mg total) by mouth daily. 03/03/20   Lorella Nimrod, MD  ammonium lactate (LAC-HYDRIN) 12 % lotion Apply 1 application topically daily.    [provider]  aspirin 81 MG EC tablet Take 1 tablet (81 mg total) by mouth daily. 02/10/21   Max Sane, MD  atorvastatin (LIPITOR) 80 MG tablet Take 1 tablet (80 mg total) by mouth daily at 6 PM. 11/26/19   Lavina Hamman, MD  bismuth subsalicylate (PEPTO BISMOL) 262 MG/15ML suspension Take 30 mLs by  mouth every 6 (six) hours as needed for indigestion or diarrhea or loose stools.    [provider]  Blood Glucose Monitoring Suppl (TRUE METRIX METER) w/Device KIT  09/02/21   [provider]  carvedilol (COREG) 25 MG tablet Take 25 mg by mouth 2 (two) times daily.    [provider]  cloNIDine (CATAPRES) 0.1 MG tablet Take 0.1 mg by mouth 2 (two) times daily. (Non-dialysis days)    [provider]  furosemide (LASIX) 40 MG tablet Take 40 mg by mouth daily.    [provider]  insulin aspart protamine- aspart (NOVOLOG MIX 70/30) (70-30) 100 UNIT/ML injection Inject 0.15 mLs (15 Units total) into the skin 2 (two) times daily with a meal. 11/12/20   Fritzi Mandes, MD  isosorbide mononitrate (IMDUR) 30 MG 24 hr tablet Take 30 mg by mouth daily.    [provider]  loperamide (IMODIUM A-D) 2 MG tablet Take 4 mg by mouth 4 (four) times daily as needed for diarrhea or loose stools.    [provider]  mirtazapine (REMERON) 30 MG tablet Take 1 tablet (30 mg total) by mouth at bedtime. 11/26/19   Lavina Hamman, MD  mometasone-formoterol [facility]) 200-5 MCG/ACT AERO Inhale 2 puffs into the lungs 2 (two) times daily.    [provider]  omeprazole (PRILOSEC) 40 MG capsule Take 40 mg by mouth daily. 09/22/20   [provider]  polyethylene glycol (MIRALAX / GLYCOLAX) 17 g packet Take 17 g by mouth daily. 02/11/21   Lorella Nimrod, MD  spironolactone (ALDACTONE) 25 MG tablet Take 1 tablet (25 mg total) by mouth daily. Patient not taking: Reported on 10/08/2021 06/16/21   Alma Friendly, MD  sucralfate (CARAFATE) 1 g tablet Take by mouth. 09/22/21   [provider]  topiramate (TOPAMAX) 25 MG tablet Take 25 mg by mouth daily. 08/19/20   [provider]  traZODone (DESYREL) 50 MG tablet Take 50 mg by mouth at bedtime.    [provider]  TRUE METRIX BLOOD GLUCOSE TEST test strip  09/04/21   [provider]  vitamin B-12 (CYANOCOBALAMIN) 1000 MCG tablet Take 1,000 mcg by mouth daily.    [provider]    Allergies Hydralazine, Kiwi extract, Lisinopril, Shellfish allergy, Betadine [povidone iodine], Metformin, and Tape  Family History  Problem Relation Age of Onset   CAD Mother    CAD Brother    Breast cancer Sister 94    Social History Social History   Tobacco Use   Smoking status: Former  Types: Cigarettes    Quit date: 2018    Years since quitting: 4.8   Smokeless tobacco: Never  Vaping Use   Vaping Use: Never used  Substance Use Topics   Alcohol use: Not Currently   Drug use: Not Currently    Review of Systems  Constitutional: No fever/chills Eyes: No visual changes. ENT: No sore throat. Positive for upper lip swelling Cardiovascular: Denies chest pain. Respiratory: Denies shortness of breath. Gastrointestinal: Positive for abdominal pain and nausea no vomiting.  No diarrhea.  No constipation. Genitourinary: Negative for dysuria. Musculoskeletal: Negative for back pain. Skin: Negative for rash. Neurological: Negative for headaches, focal weakness or numbness. ____________________________________________   PHYSICAL EXAM:  VITAL SIGNS: Vitals:   10/28/21 0206 10/28/21 0323  BP: (!) 158/94 (!) 167/75  Pulse: 69 70  Resp: 16 20  Temp: 99.6 F (37.6 C)   SpO2: 94% 93%      Constitutional: Alert and oriented. in no acute distress. Eyes: Conjunctivae are normal. PERRL. EOMI. Head: Atraumatic. Nose: No congestion/rhinnorhea. Mouth/Throat: Mucous membranes are moist.  Oropharynx non-erythematous. Diffuse and bilateral upper lip swelling without signs of trauma to the lips or mouth. Neck: No stridor. No cervical spine tenderness to palpation. Cardiovascular: Normal rate, regular rhythm. Grossly normal heart sounds.  Good peripheral circulation. Respiratory: Normal respiratory effort.  No retractions.  Faint wheezing  throughout Gastrointestinal: Soft , nondistended. No CVA tenderness. Diffuse tenderness without peritoneal features. Musculoskeletal: No lower extremity tenderness nor edema.  No joint effusions. No signs of acute trauma. Neurologic:  Normal speech and language. No gross focal neurologic deficits are appreciated. No gait instability noted. Skin:  Skin is warm, dry and intact. No rash noted. Psychiatric: Mood and affect are normal. Speech and behavior are normal. ____________________________________________   LABS (all labs ordered are listed, but only abnormal results are displayed)  Labs Reviewed  COMPREHENSIVE METABOLIC PANEL - Abnormal; Notable for the following components:      Result Value   Potassium 3.1 (*)    Glucose, Bld 182 (*)    BUN 33 (*)    Creatinine, Ser 6.27 (*)    Calcium 8.4 (*)    Albumin 3.2 (*)    GFR, Estimated 7 (*)    All other components within normal limits  CBC - Abnormal; Notable for the following components:   RDW 15.6 (*)    All other components within normal limits  RESP PANEL BY RT-PCR (FLU A&B, COVID) ARPGX2  LIPASE, BLOOD  URINALYSIS, ROUTINE W REFLEX MICROSCOPIC   ____________________________________________  12 Lead EKG   ____________________________________________  RADIOLOGY  ED MD interpretation:    Official radiology report(s): CT ABDOMEN PELVIS WO CONTRAST  Result Date: 10/28/2021 CLINICAL DATA:  Abdominal pain. EXAM: CT ABDOMEN AND PELVIS WITHOUT CONTRAST TECHNIQUE: Multidetector CT imaging of the abdomen and pelvis was performed following the standard protocol without IV contrast. COMPARISON:  CT abdomen pelvis dated 09/12/2020. FINDINGS: Evaluation of this exam is limited in the absence of intravenous contrast. Lower chest: The visualized lung bases are clear. There is cardiomegaly. No intra-abdominal free air or free fluid. Hepatobiliary: The liver is unremarkable. No intrahepatic biliary dilatation. The gallbladder is  unremarkable Pancreas: Unremarkable. No pancreatic ductal dilatation or surrounding inflammatory changes. Spleen: Normal in size without focal abnormality. Adrenals/Urinary Tract: The adrenal glands unremarkable. Moderate bilateral renal parenchyma atrophy. There is no hydronephrosis or nephrolithiasis on either side. Ill-defined bilateral renal hypodense lesions are not characterized on this CT. Mild bilateral perinephric stranding. Correlation with urinalysis recommended  to exclude UTI. The visualized ureters appear unremarkable. The urinary bladder is collapsed. Stomach/Bowel: There is sigmoid diverticulosis without active inflammatory changes. There is no bowel obstruction or active inflammation. The appendix is normal. Vascular/Lymphatic: Advanced aortoiliac atherosclerotic disease. The IVC is unremarkable. No portal venous gas. There is no adenopathy. Reproductive: The uterus is grossly unremarkable. Other: Small fat containing umbilical hernia. Musculoskeletal: No acute or significant osseous findings. IMPRESSION: 1. No acute intra-abdominal or pelvic pathology. No hydronephrosis or nephrolithiasis. 2. Sigmoid diverticulosis. No bowel obstruction. Normal appendix. 3. Aortic Atherosclerosis (ICD10-I70.0). Electronically Signed   By: Anner Crete M.D.   On: 10/28/2021 03:14    ____________________________________________   PROCEDURES and INTERVENTIONS  Procedure(s) performed (including Critical Care):  .1-3 Lead EKG Interpretation Performed by: Vladimir Crofts, MD Authorized by: Vladimir Crofts, MD     Interpretation: normal     ECG rate:  70   ECG rate assessment: normal     Rhythm: sinus rhythm     Ectopy: none     Conduction: normal    Medications  methylPREDNISolone sodium succinate (SOLU-MEDROL) 125 mg/2 mL injection 125 mg (has no administration in time range)  diphenhydrAMINE (BENADRYL) injection 25 mg (has no administration in time range)  ipratropium-albuterol (DUONEB) 0.5-2.5 (3)  MG/3ML nebulizer solution 3 mL (has no administration in time range)  pantoprazole (PROTONIX) injection 40 mg (has no administration in time range)  morphine 4 MG/ML injection 4 mg (has no administration in time range)    ____________________________________________   MDM / ED COURSE   Dialysis patient presents to the ED with 1 day of upper lip swelling abdominal pain, concerning for angioedema, and requiring observation admission.  No evidence of upper airway obstruction, stridor or distress, though her lip is obviously quite swollen.  No signs of trauma to cause this.  Has diffuse abdominal pain and tenderness, without localizing or peritoneal features on examination.  CT without evidence of intra-abdominal pathology radiographically.  Hemoglobin is stable.  More concerned about angioedema causing her abdominal pain considering the concurrent lip swelling.  We will provide duo nebs and steroids due to her wheezing.  Steroids and Benadryl to possibly assist with her lip swelling.  And morphine due to her pain.  We will discuss with medicine for admission.     ____________________________________________   FINAL CLINICAL IMPRESSION(S) / ED DIAGNOSES  Final diagnoses:  Generalized abdominal pain  Angioedema, initial encounter     ED Discharge Orders     None        Clelia Trabucco   Note:  This document was prepared using Dragon voice recognition software and may include unintentional dictation errors.    Vladimir Crofts, MD 10/28/21 [phone]

## 2021-10-28 NOTE — Progress Notes (Signed)
PHARMACY NOTE:  ANTIMICROBIAL RENAL DOSAGE ADJUSTMENT  Current antimicrobial regimen includes a mismatch between antimicrobial dosage and estimated renal function.  As per policy approved by the Pharmacy & Therapeutics and Medical Executive Committees, the antimicrobial dosage will be adjusted accordingly.  Current antiviral dosage:  oseltamivir 75 mg po BID  Renal Function:  Estimated Creatinine Clearance: 10.1 mL/min (A) (by C-G formula based on SCr of 6.27 mg/dL (H)). [x]      On intermittent HD, scheduled: []      On CRRT    Antimicrobial dosage has been changed to:  oseltamivir 30 mg po  immediately and then 30 mg once daily after every hemodialysis session    Thank you for allowing pharmacy to be a part of this patient's care.  Dallie Piles, [facility] 10/28/2021 7:39 AM

## 2021-10-28 NOTE — Progress Notes (Signed)
Pt with 3 HD session, RFA AVF, maintained prescribed BFR/DFR, met targeted UF, without adverse side effects of tx. Pt remains afebrile, VSS. Will return to floor.

## 2021-10-28 NOTE — Progress Notes (Addendum)
Received patient from ED.  A&O x 4, noted with upper and lower lips edema, denies itching.  Tongue is intact, midline, no edema.  Throat is intact, denies difficulty swallowing.  Patient is ambulatory, assisted to BR, with one person assist, gait and balance stable.  Assisted in bed in position of comfort, oriented to room, call bell within reach.  No complaints offered at this time, needs addressed.

## 2021-10-28 NOTE — ED Notes (Signed)
Pt placed on  2L [place] while sleeping

## 2021-10-28 NOTE — H&P (Signed)
[place]   PATIENT NAME: Terry Arroyo    MR#:  [id number]  DATE OF BIRTH:  1961/08/05  DATE OF ADMISSION:  10/28/2021  PRIMARY CARE PHYSICIAN: Terry Ruths, MD   Patient is coming from: Home.  REQUESTING/REFERRING PHYSICIAN: Vladimir Crofts, MD  CHIEF COMPLAINT:   Chief Complaint  Patient presents with   Abdominal Pain    HISTORY OF PRESENT ILLNESS:  Terry Arroyo is a 60 y.o. African-American female with medical history significant for COPD, end-stage renal disease on hemodialysis, HFrEF, hypertension, dyslipidemia, type 2 diabetes mellitus and depression, presented to the ER with acute onset of upper lip swelling throughout the day yesterday as well as abdominal pain with associated nausea, vomiting and diarrhea.  She denies any tongue swelling or neck swelling.  She was having wheezing helped by DuoNebs but denies any significant dyspnea or cough.  No chest pain or palpitations.  Not missed hemodialysis.  She had angioedema in the past and lisinopril was stopped about a year ago.  She denies using any new lotions or make-up, detergents or medications.  No sore throat or earache or rhinorrhea or nasal congestion.  ED Course: When she came to the ER blood pressure was 158/94 with otherwise normal vital signs.  Labs revealed mild hypokalemia with a potassium of 3.1 and glucose was 182 with a BUN of 33 and creatinine 6.27.  Albumin was 3.2.  Serum lipase was 42.  CBC was within normal.  Influenza A came back positive and influenza B and COVID-19 came back negative.  Imaging: Abdominal pelvic CT scan showed sigmoid diverticulosis, normal appendix with no bowel obstruction and no acute pathology.  It showed aortic atherosclerosis.  The patient was given IV Solu-Medrol, IV Benadryl, IV Protonix and 2 duo nebs.  She will be admitted to observation medical bed for further evaluation and management. PAST MEDICAL HISTORY:   Past Medical History:  Diagnosis Date   Anemia     Angina at rest [facility])    Angioedema 03/20/2021   Lisinopril   Anterior cerebral aneurysm    approximately 5 mm; left MCA    Anxiety    Aortic atherosclerosis (HCC)    Asthma    Blind left eye    COPD (chronic obstructive pulmonary disease) (HCC)    Coronary artery disease    Depression    DOE (dyspnea on exertion)    ESRD (end stage renal disease) (HCC)    Gait instability    GERD (gastroesophageal reflux disease)    HFrEF (heart failure with reduced ejection fraction) ([place])    History of 2019 novel coronavirus disease (COVID-19) 03/2019   Terry Arroyo   History of DVT (deep vein thrombosis)    Hx of CABG 2018   x 4; performed in [place]   Hyperlipidemia    Hypertension    MI (myocardial infarction) ([place]) 2018   no stents   Pituitary adenoma ([place])    Secondary hyperparathyroidism ([place])    Sleep apnea    non-compliant with nocturnal PAP therapy   Stroke [facility] [place] [facility]) 2014   stated affected left side.   T2DM (type 2 diabetes mellitus) ([place])     PAST SURGICAL HISTORY:   Past Surgical History:  Procedure Laterality Date   AV FISTULA PLACEMENT Right 01/28/2021   Procedure: ARTERIOVENOUS (AV) FISTULA CREATION;  Surgeon: Katha Cabal, MD;  Location: [facility] ORS;  Service: Vascular;  Laterality: Right;   AV FISTULA PLACEMENT Right 05/13/2021  Procedure: INSERTION OF ARTERIOVENOUS (AV) GORE-TEX GRAFT ARM ( BRACHIAL AXILLARY );  Surgeon: Katha Cabal, MD;  Location: [facility] ORS;  Service: Vascular;  Laterality: Right;   [place]   pituitary tumor   BREAST EXCISIONAL BIOPSY Left yrs ago   benign   CESAREAN SECTION     CORONARY ARTERY BYPASS GRAFT  2018   x 4 vessels; performed in [place]   DIALYSIS/PERMA CATHETER INSERTION N/A 11/07/2020   Procedure: DIALYSIS/PERMA CATHETER INSERTION;  Surgeon: Algernon Huxley, MD;  Location: [place] [facility];  Service: Cardiovascular;  Laterality: N/A;   DIALYSIS/PERMA CATHETER REMOVAL N/A 06/12/2021    Procedure: DIALYSIS/PERMA CATHETER REMOVAL;  Surgeon: Algernon Huxley, MD;  Location: [place] [facility];  Service: Cardiovascular;  Laterality: N/A;   EYE SURGERY Right 2021   cataracts    PERIPHERAL VASCULAR THROMBECTOMY Right 04/03/2021   Procedure: A/V Fistulagram ;  Surgeon: Katha Cabal, MD;  Location: [place] [facility];  Service: Cardiovascular;  Laterality: Right;    SOCIAL HISTORY:   Social History   Tobacco Use   Smoking status: Former    Types: Cigarettes    Quit date: 2018    Years since quitting: 4.8   Smokeless tobacco: Never  Substance Use Topics   Alcohol use: Not Currently    FAMILY HISTORY:   Family History  Problem Relation Age of Onset   CAD Mother    CAD Brother    Breast cancer Sister 69    DRUG ALLERGIES:   Allergies  Allergen Reactions   Hydralazine Hives, Shortness Of Breath, Swelling and Rash    Body aches    Kiwi Extract Hives, Swelling and Rash    Mouth swells    Lisinopril Swelling    Angioedema   Shellfish Allergy Anaphylaxis    Shrimp/lobster  Betadine leaves welts on skin     Betadine [Povidone Iodine] Hives and Rash    LEAVES WELTS ON SKIN   Metformin Diarrhea and Other (See Comments)    GI Intolerance   Tape Itching    Use paper tape whenever possible    REVIEW OF SYSTEMS:   ROS As per history of present illness. All pertinent systems were reviewed above. Constitutional, HEENT, cardiovascular, respiratory, GI, GU, musculoskeletal, neuro, psychiatric, endocrine, integumentary and hematologic systems were reviewed and are otherwise negative/unremarkable except for positive findings mentioned above in the HPI.   MEDICATIONS AT HOME:   Prior to Admission medications   Medication Sig Start Date End Date Taking? Authorizing Provider  acetaminophen (TYLENOL) 500 MG tablet Take 500-1,000 mg by mouth every 6 (six) hours as needed for mild pain or moderate pain.   Yes [provider]  albuterol (VENTOLIN HFA) 108  (90 Base) MCG/ACT inhaler Inhale 2 puffs into the lungs every 6 (six) hours as needed for wheezing or shortness of breath.   Yes [provider]  amLODipine (NORVASC) 10 MG tablet Take 1 tablet (10 mg total) by mouth daily. 03/03/20  Yes Lorella Nimrod, MD  ammonium lactate (LAC-HYDRIN) 12 % lotion Apply 1 application topically daily.   Yes [provider]  aspirin 81 MG EC tablet Take 1 tablet (81 mg total) by mouth daily. 02/10/21  Yes Max Sane, MD  atorvastatin (LIPITOR) 80 MG tablet Take 1 tablet (80 mg total) by mouth daily at 6 PM. 11/26/19  Yes Lavina Hamman, MD  bismuth subsalicylate (PEPTO BISMOL) 262 MG/15ML suspension Take 30 mLs by mouth every 6 (six)  hours as needed for indigestion or diarrhea or loose stools.   Yes [provider]  Blood Glucose Monitoring Suppl (TRUE METRIX METER) w/Device KIT  09/02/21  Yes [provider]  carvedilol (COREG) 25 MG tablet Take 25 mg by mouth 2 (two) times daily.   Yes [provider]  cloNIDine (CATAPRES) 0.1 MG tablet Take 0.1 mg by mouth 2 (two) times daily. (Non-dialysis days)   Yes [provider]  furosemide (LASIX) 40 MG tablet Take 40 mg by mouth daily.   Yes [provider]  insulin aspart protamine- aspart (NOVOLOG MIX 70/30) (70-30) 100 UNIT/ML injection Inject 0.15 mLs (15 Units total) into the skin 2 (two) times daily with a meal. 11/12/20  Yes Fritzi Mandes, MD  isosorbide mononitrate (IMDUR) 30 MG 24 hr tablet Take 30 mg by mouth daily.   Yes [provider]  loperamide (IMODIUM A-D) 2 MG tablet Take 4 mg by mouth 4 (four) times daily as needed for diarrhea or loose stools.   Yes [provider]  mirtazapine (REMERON) 30 MG tablet Take 1 tablet (30 mg total) by mouth at bedtime. 11/26/19  Yes Lavina Hamman, MD  mometasone-formoterol [facility]) 200-5 MCG/ACT AERO Inhale 2 puffs into the lungs 2 (two) times daily.   Yes [provider]  omeprazole  (PRILOSEC) 40 MG capsule Take 40 mg by mouth daily. 09/22/20  Yes [provider]  polyethylene glycol (MIRALAX / GLYCOLAX) 17 g packet Take 17 g by mouth daily. 02/11/21  Yes Lorella Nimrod, MD  topiramate (TOPAMAX) 25 MG tablet Take 25 mg by mouth daily. 08/19/20  Yes [provider]  traZODone (DESYREL) 50 MG tablet Take 50 mg by mouth at bedtime.   Yes [provider]  TRUE METRIX BLOOD GLUCOSE TEST test strip  09/04/21  Yes [provider]  vitamin B-12 (CYANOCOBALAMIN) 1000 MCG tablet Take 1,000 mcg by mouth daily.   Yes [provider]  predniSONE (STERAPRED UNI-PAK 48 TAB) 5 MG (48) TBPK tablet Take by mouth. 10/27/21   [provider]  spironolactone (ALDACTONE) 25 MG tablet Take 1 tablet (25 mg total) by mouth daily. Patient not taking: No sig reported 06/16/21   Alma Friendly, MD  sucralfate (CARAFATE) 1 g tablet Take by mouth. 09/22/21   [provider]      VITAL SIGNS:  Blood pressure (!) 151/68, pulse 65, temperature 99.6 F (37.6 C), resp. rate (!) 23, height 5' 6"  (1.676 m), weight 77.1 kg, SpO2 94 %.  PHYSICAL EXAMINATION:  Physical Exam  GENERAL:  60 y.o.-year-old Afro-American female patient lying in the bed with no acute distress.  EYES: Pupils equal, round, reactive to light and accommodation. No scleral icterus. Extraocular muscles intact.  HEENT: Head atraumatic, normocephalic. Oropharynx with significant swelling of upper lip and nasopharynx clear.  There was no tongue or neck swelling NECK:  Supple, no jugular venous distention. No thyroid enlargement, no tenderness.  LUNGS: Normal breath sounds bilaterally, no wheezing, rales,rhonchi or crepitation. No use of accessory muscles of respiration.  CARDIOVASCULAR: Regular rate and rhythm, S1, S2 normal. No murmurs, rubs, or gallops.  ABDOMEN: Soft, nondistended, nontender. Bowel sounds present. No organomegaly or mass.  EXTREMITIES: No pedal edema,  cyanosis, or clubbing.  NEUROLOGIC: Cranial nerves II through XII are intact. Muscle strength 5/5 in all extremities. Sensation intact. Gait not checked.  PSYCHIATRIC: The patient is alert and oriented x 3.  Normal affect and good eye contact. SKIN: No obvious rash, lesion,  or ulcer.   LABORATORY PANEL:   CBC Recent Labs  Lab 10/28/21 0209  WBC 5.0  HGB 12.1  HCT 37.6  PLT 184   ------------------------------------------------------------------------------------------------------------------  Chemistries  Recent Labs  Lab 10/28/21 0209  NA 135  K 3.1*  CL 100  CO2 23  GLUCOSE 182*  BUN 33*  CREATININE 6.27*  CALCIUM 8.4*  AST 18  ALT 13  ALKPHOS 64  BILITOT 0.8   ------------------------------------------------------------------------------------------------------------------  Cardiac Enzymes No results for input(s): TROPONINI in the last 168 hours. ------------------------------------------------------------------------------------------------------------------  RADIOLOGY:  CT ABDOMEN PELVIS WO CONTRAST  Result Date: 10/28/2021 CLINICAL DATA:  Abdominal pain. EXAM: CT ABDOMEN AND PELVIS WITHOUT CONTRAST TECHNIQUE: Multidetector CT imaging of the abdomen and pelvis was performed following the standard protocol without IV contrast. COMPARISON:  CT abdomen pelvis dated 09/12/2020. FINDINGS: Evaluation of this exam is limited in the absence of intravenous contrast. Lower chest: The visualized lung bases are clear. There is cardiomegaly. No intra-abdominal free air or free fluid. Hepatobiliary: The liver is unremarkable. No intrahepatic biliary dilatation. The gallbladder is unremarkable Pancreas: Unremarkable. No pancreatic ductal dilatation or surrounding inflammatory changes. Spleen: Normal in size without focal abnormality. Adrenals/Urinary Tract: The adrenal glands unremarkable. Moderate bilateral renal parenchyma atrophy. There is no hydronephrosis or nephrolithiasis on  either side. Ill-defined bilateral renal hypodense lesions are not characterized on this CT. Mild bilateral perinephric stranding. Correlation with urinalysis recommended to exclude UTI. The visualized ureters appear unremarkable. The urinary bladder is collapsed. Stomach/Bowel: There is sigmoid diverticulosis without active inflammatory changes. There is no bowel obstruction or active inflammation. The appendix is normal. Vascular/Lymphatic: Advanced aortoiliac atherosclerotic disease. The IVC is unremarkable. No portal venous gas. There is no adenopathy. Reproductive: The uterus is grossly unremarkable. Other: Small fat containing umbilical hernia. Musculoskeletal: No acute or significant osseous findings. IMPRESSION: 1. No acute intra-abdominal or pelvic pathology. No hydronephrosis or nephrolithiasis. 2. Sigmoid diverticulosis. No bowel obstruction. Normal appendix. 3. Aortic Atherosclerosis (ICD10-I70.0). Electronically Signed   By: Anner Crete M.D.   On: 10/28/2021 03:14      IMPRESSION AND PLAN:  Active Problems:   Angioedema  1.  Angioedema. - The patient will be admitted to an observation medical bed. - We will continue IV steroid Medrol. - We will continue H1 and H2 blocker therapy with IV Benadryl and IV Pepcid. - DuoNebs will be given on a as needed basis. - Her upper lip swelling will be monitored.  2.  Influenza A.  She has acute gastroenteritis as well could be related to her generalized abdominal pain. - The patient will be placed on p.o. Tamiflu. - She will be hydrated with IV normal saline. - Pain management will be provided.  3.  End-stage renal disease on hemodialysis. - We will obtain a nephrology consultation for follow-up on hemodialysis. - I notified Dr. Holley Raring about the patient.  4.  Dyslipidemia. - We will continue statin therapy.  5.  Hypertension. - We will continue Norvasc and Coreg as well as Catapres.  6.  Type 2 diabetes mellitus. - The patient  will be placed on supplement coverage with NovoLog. - We will continue basal coverage.  7.  COPD without exacerbation. - We will continue Dulera and as needed albuterol.  8.  GERD. - We will continue Carafate and PPI therapy.  DVT prophylaxis: Heparin. Code Status: full code. Family Communication:  The plan of care was discussed in details with the patient (and family). I answered all questions. The patient agreed to proceed  with the above mentioned plan. Further management will depend upon hospital course. Disposition Plan: Back to previous home environment Consults called: Nephrology.   All the records are reviewed and case discussed with ED provider.  Status is: Observation  Remains inpatient appropriate because:Ongoing diagnostic testing needed not appropriate for outpatient work up, Unsafe d/c plan, IV treatments appropriate due to intensity of illness or inability to take PO, and Inpatient level of care appropriate due to severity of illness   Dispo: The patient is from: Home              Anticipated d/c is to: Home              Patient currently is not medically stable to d/c.              Difficult to place patient: No  TOTAL TIME TAKING CARE OF THIS PATIENT: 55 minutes.     Christel Mormon M.D on 10/28/2021 at 6:59 AM  [facility]   From 7 PM-7 AM, contact night-coverage [email or web address]  CC: Primary care physician; Terry Ruths, MD

## 2021-10-28 NOTE — Progress Notes (Addendum)
[facility]  ROUNDING NOTE   Subjective:   Terry Arroyo is a 60 year old female with past medical history of COPD, heart failure, hypertension, type 2 diabetes, depression, and end-stage renal disease on hemodialysis.  Patient presents to the emergency room with complaints of upper lip swelling and abdominal pain with nausea, vomiting, and diarrhea.  Patient has been admitted under observation for Angioedema [T78.3XXA] Generalized abdominal pain [R10.84] Angioedema, initial encounter [T78.3XXA]  Patient is known to our practice and receives outpatient hemodialysis at [facility] on a Monday Wednesday Friday schedule.  Her treatments are overseen by Dr. Juleen China.  She states she just noticed her upper lip swelling yesterday and progressively got worse throughout the day.  Denies missing any recent dialysis treatments.  Denies tongue swelling or throat tightening.  Reports mild wheezing but improved with DuoNeb's.  Denies chest pain, cough, palpitations.  Patient labs indicate creatinine 6.27, BUN 33, albumin 3.2, and positive influenza B.  Negative COVID-19 test.  We have been consulted to manage dialysis needs during this admission.  Objective:  Vital signs in last 24 hours:  Temp:  [98.1 F (36.7 C)-99.6 F (37.6 C)] 98.1 F (36.7 C) (11/09 1245) Pulse Rate:  [63-70] 69 (11/09 1245) Resp:  [16-23] 18 (11/09 1245) BP: (150-171)/(68-94) 171/91 (11/09 1245) SpO2:  [93 %-100 %] 100 % (11/09 1245) Weight:  [77.1 kg] 77.1 kg (11/09 0206)  Weight change:  Filed Weights   10/28/21 0206  Weight: 77.1 kg    Intake/Output: No intake/output data recorded.   Intake/Output this shift:  No intake/output data recorded.  Physical Exam: General: NAD, resting in bed  Head: Moist oral mucosal membranes, upper lip edema  Eyes: Anicteric  Lungs:  Clear to auscultation, normal breathing effort  Heart: Regular rate and rhythm  Abdomen:  Soft, nontender, nondistended   Extremities: No peripheral edema.  Neurologic: Nonfocal, moving all four extremities  Skin: No lesions  Access: Right aVF    Basic Metabolic Panel: Recent Labs  Lab 10/28/21 0209  NA 135  K 3.1*  CL 100  CO2 23  GLUCOSE 182*  BUN 33*  CREATININE 6.27*  CALCIUM 8.4*    Liver Function Tests: Recent Labs  Lab 10/28/21 0209  AST 18  ALT 13  ALKPHOS 64  BILITOT 0.8  PROT 6.8  ALBUMIN 3.2*   Recent Labs  Lab 10/28/21 0209  LIPASE 42   No results for input(s): AMMONIA in the last 168 hours.  CBC: Recent Labs  Lab 10/28/21 0209  WBC 5.0  HGB 12.1  HCT 37.6  MCV 88.7  PLT 184    Cardiac Enzymes: No results for input(s): CKTOTAL, CKMB, CKMBINDEX, TROPONINI in the last 168 hours.  BNP: Invalid input(s): POCBNP  CBG: Recent Labs  Lab 10/28/21 0848 10/28/21 1146  GLUCAP 248* 323*    Microbiology: Results for orders placed or performed during the hospital encounter of 10/28/21  Resp Panel by RT-PCR (Flu A&B, Covid) Nasopharyngeal Swab     Status: Abnormal   Collection Time: 10/28/21  3:31 AM   Specimen: Nasopharyngeal Swab; Nasopharyngeal(NP) swabs in vial transport medium  Result Value Ref Range Status   SARS Coronavirus 2 by RT PCR NEGATIVE NEGATIVE Final    Comment: (NOTE) SARS-CoV-2 target nucleic acids are NOT DETECTED.  The SARS-CoV-2 RNA is generally detectable in upper respiratory specimens during the acute phase of infection. The lowest concentration of SARS-CoV-2 viral copies this assay can detect is 138 copies/mL. A negative result does not  preclude SARS-Cov-2 infection and should not be used as the sole basis for treatment or other patient management decisions. A negative result may occur with  improper specimen collection/handling, submission of specimen other than nasopharyngeal swab, presence of viral mutation(s) within the areas targeted by this assay, and inadequate number of viral copies(<138 copies/mL). A negative result must be  combined with clinical observations, patient history, and epidemiological information. The expected result is Negative.  Fact Sheet for Patients:  EntrepreneurPulse.com.au  Fact Sheet for Healthcare Providers:  IncredibleEmployment.be  This test is no t yet approved or cleared by the [place] FDA and  has been authorized for detection and/or diagnosis of SARS-CoV-2 by FDA under an Emergency Use Authorization (EUA). This EUA will remain  in effect (meaning this test can be used) for the duration of the COVID-19 declaration under Section 564(b)(1) of the Act, 21 U.S.C.section 360bbb-3(b)(1), unless the authorization is terminated  or revoked sooner.       Influenza A by PCR POSITIVE (A) NEGATIVE Final   Influenza B by PCR NEGATIVE NEGATIVE Final    Comment: (NOTE) The Xpert Xpress SARS-CoV-2/FLU/RSV plus assay is intended as an aid in the diagnosis of influenza from Nasopharyngeal swab specimens and should not be used as a sole basis for treatment. Nasal washings and aspirates are unacceptable for Xpert Xpress SARS-CoV-2/FLU/RSV testing.  Fact Sheet for Patients: EntrepreneurPulse.com.au  Fact Sheet for Healthcare Providers: IncredibleEmployment.be  This test is not yet approved or cleared by the [place] FDA and has been authorized for detection and/or diagnosis of SARS-CoV-2 by FDA under an Emergency Use Authorization (EUA). This EUA will remain in effect (meaning this test can be used) for the duration of the COVID-19 declaration under Section 564(b)(1) of the Act, 21 U.S.C. section 360bbb-3(b)(1), unless the authorization is terminated or revoked.  Performed at [facility], [address] [place], [place] [address]     Coagulation Studies: No results for input(s): LABPROT, INR in the last 72 hours.  Urinalysis: No results for input(s): COLORURINE, LABSPEC, PHURINE,  GLUCOSEU, HGBUR, BILIRUBINUR, KETONESUR, PROTEINUR, UROBILINOGEN, NITRITE, LEUKOCYTESUR in the last 72 hours.  Invalid input(s): APPERANCEUR    Imaging: CT ABDOMEN PELVIS WO CONTRAST  Result Date: 10/28/2021 CLINICAL DATA:  Abdominal pain. EXAM: CT ABDOMEN AND PELVIS WITHOUT CONTRAST TECHNIQUE: Multidetector CT imaging of the abdomen and pelvis was performed following the standard protocol without IV contrast. COMPARISON:  CT abdomen pelvis dated 09/12/2020. FINDINGS: Evaluation of this exam is limited in the absence of intravenous contrast. Lower chest: The visualized lung bases are clear. There is cardiomegaly. No intra-abdominal free air or free fluid. Hepatobiliary: The liver is unremarkable. No intrahepatic biliary dilatation. The gallbladder is unremarkable Pancreas: Unremarkable. No pancreatic ductal dilatation or surrounding inflammatory changes. Spleen: Normal in size without focal abnormality. Adrenals/Urinary Tract: The adrenal glands unremarkable. Moderate bilateral renal parenchyma atrophy. There is no hydronephrosis or nephrolithiasis on either side. Ill-defined bilateral renal hypodense lesions are not characterized on this CT. Mild bilateral perinephric stranding. Correlation with urinalysis recommended to exclude UTI. The visualized ureters appear unremarkable. The urinary bladder is collapsed. Stomach/Bowel: There is sigmoid diverticulosis without active inflammatory changes. There is no bowel obstruction or active inflammation. The appendix is normal. Vascular/Lymphatic: Advanced aortoiliac atherosclerotic disease. The IVC is unremarkable. No portal venous gas. There is no adenopathy. Reproductive: The uterus is grossly unremarkable. Other: Small fat containing umbilical hernia. Musculoskeletal: No acute or significant osseous findings. IMPRESSION: 1. No acute intra-abdominal or pelvic pathology. No hydronephrosis or  nephrolithiasis. 2. Sigmoid diverticulosis. No bowel obstruction. Normal  appendix. 3. Aortic Atherosclerosis (ICD10-I70.0). Electronically Signed   By: Anner Crete M.D.   On: 10/28/2021 03:14     Medications:    sodium chloride 30 mL/hr at 10/28/21 0906   sodium chloride     sodium chloride     famotidine (PEPCID) IV Stopped (10/28/21 0730)    amLODipine  10 mg Oral Daily   aspirin EC  81 mg Oral Daily   atorvastatin  80 mg Oral q1800   carvedilol  25 mg Oral BID WC   Chlorhexidine Gluconate Cloth  6 each Topical Q0600   cloNIDine  0.1 mg Oral BID   diphenhydrAMINE  25 mg Intravenous Q6H   furosemide  40 mg Oral Daily   heparin  5,000 Units Subcutaneous Q8H   insulin aspart  0-15 Units Subcutaneous TID AC & HS   insulin aspart protamine- aspart  15 Units Subcutaneous BID WC   isosorbide mononitrate  30 mg Oral Daily   [START ON 10/29/2021] methylPREDNISolone (SOLU-MEDROL) injection  120 mg Intravenous Q24H   mirtazapine  30 mg Oral QHS   mometasone-formoterol  2 puff Inhalation BID   oseltamivir  30 mg Oral Daily   pantoprazole  40 mg Oral Daily   polyethylene glycol  17 g Oral Daily   sucralfate  1 g Oral BID   topiramate  25 mg Oral Daily   traZODone  50 mg Oral QHS   vitamin B-12  1,000 mcg Oral Daily   sodium chloride, sodium chloride, acetaminophen **OR** acetaminophen, albuterol, alteplase, ammonium lactate, bismuth subsalicylate, heparin, lidocaine (PF), lidocaine-prilocaine, loperamide, magnesium hydroxide, ondansetron **OR** ondansetron (ZOFRAN) IV, pentafluoroprop-tetrafluoroeth  Assessment/ Plan:  [name] is a 60 y.o.  female with past medical history of COPD, systolic chronic heart failure, hypertension, type 2 diabetes, depression, and end-stage renal disease on hemodialysis.  Patient admitted under observation for Angioedema [T78.3XXA] Generalized abdominal pain [R10.84] Angioedema, initial encounter [T78.3XXA]   [facility]/MWF/ Rt AVF  Angioedema with unclear etiology. Currently prescribed IV  steroids, Benadryl, and Pepcid.  DuoNebs ordered as needed.  IV fluids decreased to normal saline 30 mils per hour  2.  End-stage renal disease on hemodialysis.  Will maintain outpatient scheduling during this admission.  Plan for treatment today.  3. Anemia of chronic kidney disease Lab Results  Component Value Date   HGB 12.1 10/28/2021  Hemoglobin above target, no need for ESA's at this time  4. Secondary Hyperparathyroidism:  Lab Results  Component Value Date   CALCIUM 8.4 (L) 10/28/2021   CAION 1.09 (L) 05/13/2021   PHOS 5.5 (H) 09/07/2021  Calcium and phosphorus not at target.  Will obtain updated labs in a.m.  5. Diabetes mellitus type II with chronic kidney disease insulin dependent. Home regimen includes NovoLog 70/30. Most recent hemoglobin A1c is 8.0 on 09/06/21.  Glucose currently elevated due to IV steroid use.  Sliding scale insulin managed by primary team  6.  Chronic systolic heart failure.  Echo from 06/13/2021 indicates EF 35 to 40%.  Monitor IV fluids for fluid volume overload.   LOS: 0 Eneida Evers 11/9/20222:27 PM

## 2021-10-29 DIAGNOSIS — N2581 Secondary hyperparathyroidism of renal origin: Secondary | ICD-10-CM | POA: Diagnosis not present

## 2021-10-29 DIAGNOSIS — J111 Influenza due to unidentified influenza virus with other respiratory manifestations: Secondary | ICD-10-CM | POA: Diagnosis not present

## 2021-10-29 DIAGNOSIS — I10 Essential (primary) hypertension: Secondary | ICD-10-CM

## 2021-10-29 DIAGNOSIS — E7849 Other hyperlipidemia: Secondary | ICD-10-CM | POA: Diagnosis not present

## 2021-10-29 DIAGNOSIS — K2100 Gastro-esophageal reflux disease with esophagitis, without bleeding: Secondary | ICD-10-CM | POA: Diagnosis not present

## 2021-10-29 DIAGNOSIS — T783XXD Angioneurotic edema, subsequent encounter: Secondary | ICD-10-CM | POA: Diagnosis not present

## 2021-10-29 DIAGNOSIS — T783XXA Angioneurotic edema, initial encounter: Secondary | ICD-10-CM | POA: Diagnosis not present

## 2021-10-29 DIAGNOSIS — D631 Anemia in chronic kidney disease: Secondary | ICD-10-CM | POA: Diagnosis not present

## 2021-10-29 LAB — CBC
HCT: 35.2 % — ABNORMAL LOW (ref 36.0–46.0)
Hemoglobin: 11.3 g/dL — ABNORMAL LOW (ref 12.0–15.0)
MCH: 27.8 pg (ref 26.0–34.0)
MCHC: 32.1 g/dL (ref 30.0–36.0)
MCV: 86.5 fL (ref 80.0–100.0)
Platelets: 173 10*3/uL (ref 150–400)
RBC: 4.07 MIL/uL (ref 3.87–5.11)
RDW: 15.2 % (ref 11.5–15.5)
WBC: 3.5 10*3/uL — ABNORMAL LOW (ref 4.0–10.5)
nRBC: 0 % (ref 0.0–0.2)

## 2021-10-29 LAB — GLUCOSE, CAPILLARY
Glucose-Capillary: 266 mg/dL — ABNORMAL HIGH (ref 70–99)
Glucose-Capillary: 278 mg/dL — ABNORMAL HIGH (ref 70–99)

## 2021-10-29 LAB — BASIC METABOLIC PANEL
Anion gap: 9 (ref 5–15)
BUN: 25 mg/dL — ABNORMAL HIGH (ref 6–20)
CO2: 27 mmol/L (ref 22–32)
Calcium: 8.2 mg/dL — ABNORMAL LOW (ref 8.9–10.3)
Chloride: 99 mmol/L (ref 98–111)
Creatinine, Ser: 4.48 mg/dL — ABNORMAL HIGH (ref 0.44–1.00)
GFR, Estimated: 11 mL/min — ABNORMAL LOW (ref >60)
Glucose, Bld: 283 mg/dL — ABNORMAL HIGH (ref 70–99)
Potassium: 3.8 mmol/L (ref 3.5–5.1)
Sodium: 135 mmol/L (ref 135–145)

## 2021-10-29 LAB — HEPATITIS B SURFACE ANTIBODY, QUANTITATIVE: Hep B S AB Quant (Post): 28.8 m[IU]/mL (ref >9.9)

## 2021-10-29 LAB — HEPATITIS B SURFACE ANTIBODY,QUALITATIVE: Hep B S Ab: REACTIVE — AB

## 2021-10-29 LAB — PHOSPHORUS: Phosphorus: 3.9 mg/dL (ref 2.5–4.6)

## 2021-10-29 LAB — HEPATITIS B SURFACE ANTIGEN: Hepatitis B Surface Ag: NONREACTIVE

## 2021-10-29 MED ORDER — OSELTAMIVIR PHOSPHATE 30 MG PO CAPS: 30.0000 mg | ORAL_CAPSULE | Freq: Every day | ORAL | 0 refills | Status: AC

## 2021-10-29 MED ORDER — PREDNISONE 20 MG PO TABS: 20.0000 mg | ORAL_TABLET | Freq: Every day | ORAL | 0 refills | Status: AC

## 2021-10-29 NOTE — TOC Transition Note (Signed)
Transition of Care [facility]) - CM/SW Discharge Note   Patient Details  Name: Terry Arroyo MRN: [id number] Date of Birth: [birth year]  Transition of Care [facility]) CM/SW Contact:  Candie Chroman, LCSW Phone Number: 10/29/2021, 1:11 PM   Clinical Narrative:   Patient has orders to discharge home today. [facility] [employer]/Ly[employer] arranged. Estimated time of arrival 1:14 pm. Nurse tech will take her down to [place] entrance. No further concerns. CSW signing off.  Final next level of care: Home/Self Care Barriers to Discharge: No Barriers Identified   Patient Goals and CMS Choice     Choice offered to / list presented to : NA  Discharge Placement                Patient to be transferred to facility by: [facility] - Uber/Lyft   Patient and family notified of of transfer: 10/29/21  Discharge Plan and Services     Post Acute Care Choice: NA                               Social Determinants of Health (SDOH) Interventions     Readmission Risk Interventions Readmission Risk Prevention Plan 10/29/2021 11/03/2020 10/23/2020  Transportation Screening Complete Complete Complete  Medication Review Press photographer) Complete Complete Complete  PCP or Specialist appointment within 3-5 days of discharge Complete Not Complete Complete  PCP/Specialist Appt Not Complete comments - To be scheduled by unit secretary. [name] or [place] - Complete Complete  SW Recovery Care/Counseling Consult Complete Complete Complete  Palliative Care Screening Not Applicable Not Applicable Not [place] Not Applicable Patient Refused Not Applicable  SNF Comments - Patient refuses states she is able to take care of herself. -  Some recent data might be hidden

## 2021-10-29 NOTE — TOC Initial Note (Signed)
Transition of Care [facility]) - Initial/Assessment Note    Patient Details  Name: Terry Arroyo MRN: [id number] Date of Birth: 08-03-1961  Transition of Care [facility]) CM/SW Contact:    Candie Chroman, LCSW Phone Number: 10/29/2021, 10:32 AM  Clinical Narrative:   Readmission prevention screen complete. CSW met with patient. No supports at bedside. CSW introduced role and explained that discharge planning would be discussed. PCP is Frazier Richards, MD. Patient uses [facility] to get to appointments. Pharmacy is Paediatric nurse on [address]. She also uses [facility] which mails her, her medications. No issues obtaining medications. No home healt prior to admission. Patient has a walker and cane at home. Patient confirmed she will need transport home today. Address on facesheet is correct. Patient reports she is "very independent" and does not need assistance getting to the door. Will arrange [facility] [employer]/Ly[employer] when ready. No further concerns.                Expected Discharge Plan: Home/Self Care Barriers to Discharge: No Barriers Identified   Patient Goals and CMS Choice     Choice offered to / list presented to : NA  Expected Discharge Plan and Services Expected Discharge Plan: Home/Self Care     Post Acute Care Choice: NA Living arrangements for the past 2 months: Apartment                                      Prior Living Arrangements/Services Living arrangements for the past 2 months: Apartment   Patient language and need for interpreter reviewed:: Yes Do you feel safe going back to the place where you live?: Yes      Need for Family Participation in Patient Care: Yes (Comment)   Current home services: DME Criminal Activity/Legal Involvement Pertinent to Current Situation/Hospitalization: No - Comment as needed  Activities of Daily Living Home Assistive Devices/Equipment: Cane (specify quad or straight), Walker (specify type), Shower chair with  back ADL Screening (condition at time of admission) Patient's cognitive ability adequate to safely complete daily activities?: Yes Is the patient deaf or have difficulty hearing?: No Does the patient have difficulty seeing, even when wearing glasses/contacts?: No Does the patient have difficulty concentrating, remembering, or making decisions?: No Patient able to express need for assistance with ADLs?: Yes Does the patient have difficulty dressing or bathing?: No Independently performs ADLs?: Yes (appropriate for developmental age) Does the patient have difficulty walking or climbing stairs?: No Weakness of Legs: None Weakness of Arms/Hands: None  Permission Sought/Granted                  Emotional Assessment Appearance:: Appears stated age Attitude/Demeanor/Rapport: Engaged, Gracious Affect (typically observed): Accepting, Appropriate, Calm, Pleasant Orientation: : Oriented to Self, Oriented to Place, Oriented to  Time, Oriented to Situation Alcohol / Substance Use: Not Applicable Psych Involvement: No (comment)  Admission diagnosis:  Angioedema [T78.3XXA] Generalized abdominal pain [R10.84] Angioedema, initial encounter [T78.3XXA] Patient Active Problem List   Diagnosis Date Noted   Angioedema 10/28/2021   Lactic acidosis    Diabetic polyneuropathy associated with type 2 diabetes mellitus ([place])    Sepsis ([place]) 09/05/2021   Acute CHF (congestive heart failure) ([place]) 09/05/2021   Fluid overload 06/11/2021   Hypokalemia 06/11/2021   Acute pulmonary edema (HCC)    COPD (chronic obstructive pulmonary disease) with emphysema ([place]) 02/09/2021   Chronic sphenoidal sinusitis 01/01/2021  Contact dermatitis and other eczema due to other specified agent 01/01/2021   Hydronephrosis, left 01/01/2021   Other diseases of nasal cavity and sinuses(478.19) 01/01/2021   ESRD (end stage renal disease) ([place])    Reactive thrombocytosis 11/08/2020   Nephrotic syndrome 11/04/2020   SOB  (shortness of breath) 11/01/2020   Acute on chronic combined systolic and diastolic CHF (congestive heart failure) ([place]) 10/21/2020   CAD (coronary artery disease) 10/21/2020   Hyperlipidemia associated with type 2 diabetes mellitus ([place]) 10/21/2020   HFrEF (heart failure with reduced ejection fraction) ([place])    Acute respiratory distress    COPD exacerbation ([place]) 10/10/2020   Kidney hematoma 09/13/2020   Essential hypertension    Acute renal failure superimposed on stage 4 chronic kidney disease ([place])    Hematuria 09/12/2020   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[place]) 09/03/2020   Blind left eye 09/03/2020   Brain aneurysm 09/03/2020   Esotropia 09/03/2020   Lesion of ulnar nerve 09/03/2020   Mixed hyperlipidemia 09/03/2020   Sensory hearing loss, bilateral 09/03/2020   Tear film insufficiency 09/03/2020   Health maintenance examination 06/27/2020   Secondary hyperparathyroidism of renal origin ([place]) 04/24/2020   Anemia in chronic kidney disease 03/10/2020   Benign hypertensive kidney disease with chronic kidney disease 03/10/2020   Hyposmolality and/or hyponatremia 03/10/2020   Stage 3b chronic kidney disease ([place]) 03/10/2020   Calf tenderness    Acute on chronic heart failure ([place]) 02/27/2020   Leg edema    Type 2 diabetes mellitus with hyperlipidemia ([place]) 02/26/2020   CKD (chronic kidney disease) stage 4, GFR 15-29 ml/min (HCC) 02/26/2020   Hyponatremia 02/26/2020   Anasarca 02/26/2020   Proteinuria 02/26/2020   Hypoalbuminemia 02/26/2020   Elevated troponin 02/26/2020   Acute on chronic systolic CHF (congestive heart failure) ([name]) 02/26/2020   Aortic atherosclerosis ([place]) 02/18/2020   Hyperglycemia    Nausea    Hypertensive emergency    Hypertensive urgency 11/24/2019   Chest pain 11/23/2019   OSA (obstructive sleep apnea) 04/10/2019   Mild episode of recurrent major depressive disorder ([place]) 04/10/2019   ACS (acute coronary syndrome) ([place])  03/24/2019   COVID-19 virus infection 03/24/2019   Hypertensive heart/kidney disease w/chronic kidney disease stage III ([place]) 10/21/2017   Mild nonproliferative diabetic retinopathy of right eye associated with type 2 diabetes mellitus ([place]) 09/09/2017   Genital herpes 05/07/2017   DVT (deep venous thrombosis) ([place]) 12/30/2016   PVD (posterior vitreous detachment), right eye 01/09/2016   Diabetic peripheral angiopathy ([place]) 01/03/2016   Age-related nuclear cataract of both eyes 10/24/2015   Chronic nonintractable headache 10/02/2015   Dry eyes, bilateral 02/13/2015   Proptosis 12/18/2014   Iritis of right eye 12/03/2014   Anemia 08/30/2014   Depression 08/30/2014   PCP:  Kirk Ruths, MD Pharmacy:   [facility] [address], [place] - [place] [address] [place] [address] Phone: [phone] Fax: [phone]  [facility] [place] [address] Phone: [phone] Fax: [phone]     Social Determinants of Health (SDOH) Interventions    Readmission Risk Interventions Readmission Risk Prevention Plan 10/29/2021 11/03/2020 10/23/2020  Transportation Screening Complete Complete Complete  Medication Review (RN Care Manager) Complete Complete Complete  PCP or Specialist appointment within 3-5 days of discharge Complete Not Complete Complete  PCP/Specialist Appt Not Complete comments - To be scheduled by unit secretary. [name] or [place] -  Complete Complete  SW Recovery Care/Counseling Consult Complete Complete Complete  Palliative Care Screening Not Applicable Not Applicable Not Applicable  Skilled Nursing Facility Not Applicable Patient Refused Not Applicable  SNF Comments - Patient refuses states she is able to take care of herself. -  Some recent data might be hidden

## 2021-10-29 NOTE — Care Management CC44 (Signed)
Condition Code 44 Documentation Completed  Patient Details  Name: COUMBA KELLISON MRN: [id number] Date of Birth: 04-28-1961   Condition Code 44 given:  Yes Patient signature on Condition Code 44 notice:  Yes Documentation of 2 MD's agreement:  Yes Code 44 added to claim:  Yes    Candie Chroman, LCSW 10/29/2021, 10:29 AM

## 2021-10-29 NOTE — Care Management Obs Status (Signed)
[facility] NOTIFICATION   Patient Details  Name: SKYLAR FLYNT MRN: [id number] Date of Birth: 09-Jan-1961   Medicare Observation Status Notification Given:  Yes    Candie Chroman, LCSW 10/29/2021, 10:29 AM

## 2021-10-29 NOTE — Progress Notes (Signed)
[facility]  ROUNDING NOTE   Subjective:   Terry Arroyo is a 60 year old female with past medical history of COPD, heart failure, hypertension, type 2 diabetes, depression, and end-stage renal disease on hemodialysis.  Patient presents to the emergency room with complaints of upper lip swelling and abdominal pain with nausea, vomiting, and diarrhea.  Patient has been admitted under observation for Angioedema [T78.3XXA] Generalized abdominal pain [R10.84] Angioedema, initial encounter [T78.3XXA]  Patient is known to our practice and receives outpatient hemodialysis at [facility] on a Monday Wednesday Friday schedule.  Her treatments are overseen by Dr. Juleen China.    Patient seen sitting at the side of bed, alert and oriented Tolerating small meals and denies nausea and vomiting States her breathing has slightly improved.  Received dialysis yesterday, tolerated well  Objective:  Vital signs in last 24 hours:  Temp:  [97.7 F (36.5 C)-98.2 F (36.8 C)] 97.7 F (36.5 C) (11/10 0815) Pulse Rate:  [55-71] 55 (11/10 0815) Resp:  [13-24] 18 (11/10 0815) BP: (131-174)/(57-96) 141/70 (11/10 0815) SpO2:  [96 %-100 %] 96 % (11/10 0845) Weight:  [74.3 kg-74.8 kg] 74.3 kg (11/09 2100)  Weight change: -2.311 kg Filed Weights   10/28/21 0206 10/28/21 1703 10/28/21 2100  Weight: 77.1 kg 74.8 kg 74.3 kg    Intake/Output: I/O last 3 completed shifts: In: 1282.4 [P.O.:720; I.V.:462.5; IV Piggyback:99.9] Out: 500 [Other:500]   Intake/Output this shift:  Total I/O In: 229.4 [I.V.:216.5; IV Piggyback:12.9] Out: -   Physical Exam: General: NAD, sitting at bedside  Head: Moist oral mucosal membranes, upper lip edema (improved)  Eyes: Anicteric  Lungs:  Clear to auscultation, normal breathing effort  Heart: Regular rate and rhythm  Abdomen:  Soft, nontender, nondistended  Extremities: No peripheral edema.  Neurologic: Nonfocal, moving all four extremities  Skin: No  lesions  Access: Right aVF    Basic Metabolic Panel: Recent Labs  Lab 10/28/21 0209 10/29/21 0638  NA 135 135  K 3.1* 3.8  CL 100 99  CO2 23 27  GLUCOSE 182* 283*  BUN 33* 25*  CREATININE 6.27* 4.48*  CALCIUM 8.4* 8.2*  PHOS  --  3.9     Liver Function Tests: Recent Labs  Lab 10/28/21 0209  AST 18  ALT 13  ALKPHOS 64  BILITOT 0.8  PROT 6.8  ALBUMIN 3.2*    Recent Labs  Lab 10/28/21 0209  LIPASE 42    No results for input(s): AMMONIA in the last 168 hours.  CBC: Recent Labs  Lab 10/28/21 0209 10/29/21 0638  WBC 5.0 3.5*  HGB 12.1 11.3*  HCT 37.6 35.2*  MCV 88.7 86.5  PLT 184 173     Cardiac Enzymes: No results for input(s): CKTOTAL, CKMB, CKMBINDEX, TROPONINI in the last 168 hours.  BNP: Invalid input(s): POCBNP  CBG: Recent Labs  Lab 10/28/21 0848 10/28/21 1146 10/28/21 2134 10/29/21 0819 10/29/21 1149  GLUCAP 248* 323* 187* 266* 278*     Microbiology: Results for orders placed or performed during the hospital encounter of 10/28/21  Resp Panel by RT-PCR (Flu A&B, Covid) Nasopharyngeal Swab     Status: Abnormal   Collection Time: 10/28/21  3:31 AM   Specimen: Nasopharyngeal Swab; Nasopharyngeal(NP) swabs in vial transport medium  Result Value Ref Range Status   SARS Coronavirus 2 by RT PCR NEGATIVE NEGATIVE Final    Comment: (NOTE) SARS-CoV-2 target nucleic acids are NOT DETECTED.  The SARS-CoV-2 RNA is generally detectable in upper respiratory specimens during the acute  phase of infection. The lowest concentration of SARS-CoV-2 viral copies this assay can detect is 138 copies/mL. A negative result does not preclude SARS-Cov-2 infection and should not be used as the sole basis for treatment or other patient management decisions. A negative result may occur with  improper specimen collection/handling, submission of specimen other than nasopharyngeal swab, presence of viral mutation(s) within the areas targeted by this assay, and  inadequate number of viral copies(<138 copies/mL). A negative result must be combined with clinical observations, patient history, and epidemiological information. The expected result is Negative.  Fact Sheet for Patients:  EntrepreneurPulse.com.au  Fact Sheet for Healthcare Providers:  IncredibleEmployment.be  This test is no t yet approved or cleared by the [place] FDA and  has been authorized for detection and/or diagnosis of SARS-CoV-2 by FDA under an Emergency Use Authorization (EUA). This EUA will remain  in effect (meaning this test can be used) for the duration of the COVID-19 declaration under Section 564(b)(1) of the Act, 21 U.S.C.section 360bbb-3(b)(1), unless the authorization is terminated  or revoked sooner.       Influenza A by PCR POSITIVE (A) NEGATIVE Final   Influenza B by PCR NEGATIVE NEGATIVE Final    Comment: (NOTE) The Xpert Xpress SARS-CoV-2/FLU/RSV plus assay is intended as an aid in the diagnosis of influenza from Nasopharyngeal swab specimens and should not be used as a sole basis for treatment. Nasal washings and aspirates are unacceptable for Xpert Xpress SARS-CoV-2/FLU/RSV testing.  Fact Sheet for Patients: EntrepreneurPulse.com.au  Fact Sheet for Healthcare Providers: IncredibleEmployment.be  This test is not yet approved or cleared by the [place] FDA and has been authorized for detection and/or diagnosis of SARS-CoV-2 by FDA under an Emergency Use Authorization (EUA). This EUA will remain in effect (meaning this test can be used) for the duration of the COVID-19 declaration under Section 564(b)(1) of the Act, 21 U.S.C. section 360bbb-3(b)(1), unless the authorization is terminated or revoked.  Performed at [facility], [address] [place], [place] [address]     Coagulation Studies: No results for input(s): LABPROT, INR in the last 72  hours.  Urinalysis: No results for input(s): COLORURINE, LABSPEC, PHURINE, GLUCOSEU, HGBUR, BILIRUBINUR, KETONESUR, PROTEINUR, UROBILINOGEN, NITRITE, LEUKOCYTESUR in the last 72 hours.  Invalid input(s): APPERANCEUR    Imaging: CT ABDOMEN PELVIS WO CONTRAST  Result Date: 10/28/2021 CLINICAL DATA:  Abdominal pain. EXAM: CT ABDOMEN AND PELVIS WITHOUT CONTRAST TECHNIQUE: Multidetector CT imaging of the abdomen and pelvis was performed following the standard protocol without IV contrast. COMPARISON:  CT abdomen pelvis dated 09/12/2020. FINDINGS: Evaluation of this exam is limited in the absence of intravenous contrast. Lower chest: The visualized lung bases are clear. There is cardiomegaly. No intra-abdominal free air or free fluid. Hepatobiliary: The liver is unremarkable. No intrahepatic biliary dilatation. The gallbladder is unremarkable Pancreas: Unremarkable. No pancreatic ductal dilatation or surrounding inflammatory changes. Spleen: Normal in size without focal abnormality. Adrenals/Urinary Tract: The adrenal glands unremarkable. Moderate bilateral renal parenchyma atrophy. There is no hydronephrosis or nephrolithiasis on either side. Ill-defined bilateral renal hypodense lesions are not characterized on this CT. Mild bilateral perinephric stranding. Correlation with urinalysis recommended to exclude UTI. The visualized ureters appear unremarkable. The urinary bladder is collapsed. Stomach/Bowel: There is sigmoid diverticulosis without active inflammatory changes. There is no bowel obstruction or active inflammation. The appendix is normal. Vascular/Lymphatic: Advanced aortoiliac atherosclerotic disease. The IVC is unremarkable. No portal venous gas. There is no adenopathy. Reproductive: The uterus is grossly unremarkable. Other: Small  fat containing umbilical hernia. Musculoskeletal: No acute or significant osseous findings. IMPRESSION: 1. No acute intra-abdominal or pelvic pathology. No  hydronephrosis or nephrolithiasis. 2. Sigmoid diverticulosis. No bowel obstruction. Normal appendix. 3. Aortic Atherosclerosis (ICD10-I70.0). Electronically Signed   By: Anner Crete M.D.   On: 10/28/2021 03:14     Medications:    sodium chloride 30 mL/hr at 10/29/21 1024   famotidine (PEPCID) IV Stopped (10/29/21 4158)    amLODipine  10 mg Oral Daily   aspirin EC  81 mg Oral Daily   atorvastatin  80 mg Oral q1800   carvedilol  25 mg Oral BID WC   Chlorhexidine Gluconate Cloth  6 each Topical Q0600   cloNIDine  0.1 mg Oral BID   diphenhydrAMINE  25 mg Intravenous Q6H   furosemide  40 mg Oral Daily   heparin  5,000 Units Subcutaneous Q8H   insulin aspart  0-15 Units Subcutaneous TID AC & HS   insulin aspart protamine- aspart  15 Units Subcutaneous BID WC   isosorbide mononitrate  30 mg Oral Daily   mirtazapine  30 mg Oral QHS   mometasone-formoterol  2 puff Inhalation BID   oseltamivir  30 mg Oral Daily   pantoprazole  40 mg Oral Daily   polyethylene glycol  17 g Oral Daily   topiramate  25 mg Oral Daily   traZODone  50 mg Oral QHS   vitamin B-12  1,000 mcg Oral Daily   acetaminophen **OR** acetaminophen, albuterol, ammonium lactate, bismuth subsalicylate, loperamide, magnesium hydroxide, ondansetron **OR** ondansetron (ZOFRAN) IV  Assessment/ Plan:  Ms. EILISH MCDANIEL is a 60 y.o.  female with past medical history of COPD, systolic chronic heart failure, hypertension, type 2 diabetes, depression, and end-stage renal disease on hemodialysis.  Patient admitted under observation for Angioedema [T78.3XXA] Generalized abdominal pain [R10.84] Angioedema, initial encounter [T78.3XXA]   [facility]/MWF/ Rt AVF  Angioedema with unclear etiology. Currently prescribed IV steroids, Benadryl, and Pepcid.  DuoNebs ordered as needed.  I IV fluids discontinued.  Edema improved.  2.  End-stage renal disease on hemodialysis.  Will maintain outpatient scheduling during this  admission.  Received dialysis treatment yesterday evening, UF goal 500 mL achieved.  Next treatment scheduled for Friday, if patient remains inpatient.  3. Anemia of chronic kidney disease Lab Results  Component Value Date   HGB 11.3 (L) 10/29/2021  Hemoglobin at goal  4. Secondary Hyperparathyroidism:  Lab Results  Component Value Date   CALCIUM 8.2 (L) 10/29/2021   CAION 1.09 (L) 05/13/2021   PHOS 3.9 10/29/2021  Calcium below target but phosphorus is at goal  5. Diabetes mellitus type II with chronic kidney disease insulin dependent. Home regimen includes NovoLog 70/30. Most recent hemoglobin A1c is 8.0 on 09/06/21.  Hyperglycemia in relation to steroid use.  Sliding scale insulin managed by primary team  6.  Chronic systolic heart failure.  Echo from 06/13/2021 indicates EF 35 to 40%.  IV fluids discontinued   LOS: 1 Antuan Limes 11/10/20221:04 PM

## 2021-10-29 NOTE — Plan of Care (Signed)
  Problem: Clinical Measurements: Goal: Ability to maintain clinical measurements within normal limits will improve Outcome: Progressing Goal: Will remain free from infection Outcome: Progressing Goal: Diagnostic test results will improve Outcome: Progressing Goal: Respiratory complications will improve Outcome: Progressing Goal: Cardiovascular complication will be avoided Outcome: Progressing   Pt is involved in and agrees with the plan of care. V/S stable except HR in 50s. Lips still swollen and complained of itchiness. Scheduled bendaryl given.

## 2021-10-29 NOTE — Plan of Care (Signed)
Patient discharged to home, discharge instructions, care, medications, and followup appointments reviewed and given to patient who verbalized understanding.

## 2021-10-29 NOTE — Discharge Summary (Signed)
Terry Arroyo ZWC:[id number] DOB: 1961-12-03 DOA: 10/28/2021  PCP: Kirk Ruths, MD  Admit date: 10/28/2021 Discharge date: 10/29/2021  Admitted From: home Disposition:  home  Recommendations for Outpatient Follow-up:  Follow up with PCP in 1 week Please obtain BMP/CBC in one week       Discharge Condition:Stable CODE STATUS:full  Diet recommendation: carb modified   Brief/Interim Summary: Per MPN:[id number] Terry Arroyo is a 60 y.o. African-American female with medical history significant for COPD, end-stage renal disease on hemodialysis, HFrEF, hypertension, dyslipidemia, type 2 diabetes mellitus and depression, presented to the ER with acute onset of upper lip swelling throughout the day yesterday as well as abdominal pain with associated nausea, vomiting and diarrhea.  She denies any tongue swelling or neck swelling.  Influenza A came back positive and influenza B and COVID-19 came back negative. She was started on IV Solu-Medrol, IV Benadryl, and IV Protonix for her angioedema.  She reported her new medication was sucralfate which was stopped.  She will likely need to have allergy testing and reevaluate all her new medications to see what she is allergic to that cause her to have angioedema. She had dialysis yesterday and she will need to follow-up with her dialysis center on her regular schedule. Her angioedema has improved.  She has no respiratory symptoms such as shortness of breath.  She is stable to be discharged home.  1.  Angioedema. Treated with IV steroids and IV Benadryl Has improved Unsure what was the cause, sucralfate was one of her new medications which was stopped here Likely needs allergic testing with allergy specialist-we will defer to her PCP  2.  Influenza A.  She has acute gastroenteritis as well could be related to her generalized abdominal pain. - The patient will be placed on p.o. Tamiflu. hydrated with IV normal saline.   3.  End-stage renal disease  on hemodialysis. Nephrology was consulted she underwent hemodialysis yesterday Follow-up with her dialysis center on her regular schedule  4.  Dyslipidemia. Continue statins  5.  Hypertension. Continue home meds  6.  Type 2 diabetes mellitus. Continue home meds Monitor BG closely  7.  COPD without exacerbation. Continue home inhalers  8.  GERD. Carafate discontinued as she reported it was a new medication and unsure if that caused angioedema Continue PPI  Discharge Diagnoses:  Active Problems:   Angioedema    Discharge Instructions  Discharge Instructions     Call MD for:  difficulty breathing, headache or visual disturbances   Complete by: As directed    Diet - low sodium heart healthy   Complete by: As directed    Diet Carb Modified   Complete by: As directed    Discharge instructions   Complete by: As directed    Start your prednisone tomorrow. Watch your sugars closely F/u with pcp to see what you are allergic to. May need allergy test Take benadryl if you start itching again   Increase activity slowly   Complete by: As directed       Allergies as of 10/29/2021       Reactions   Hydralazine Hives, Shortness Of Breath, Swelling, Rash   Body aches   Kiwi Extract Hives, Swelling, Rash   Mouth swells    Lisinopril Swelling   Angioedema   Shellfish Allergy Anaphylaxis   Shrimp/lobster  Betadine leaves welts on skin    Betadine [povidone Iodine] Hives, Rash   LEAVES WELTS ON SKIN   Metformin Diarrhea, Other (See Comments)  GI Intolerance   Tape Itching   Use paper tape whenever possible        Medication List     STOP taking these medications    predniSONE 5 MG (48) Tbpk tablet Commonly known as: STERAPRED UNI-PAK 48 TAB Replaced by: predniSONE 20 MG tablet   spironolactone 25 MG tablet Commonly known as: ALDACTONE   sucralfate 1 g tablet Commonly known as: CARAFATE       TAKE these medications    acetaminophen 500 MG  tablet Commonly known as: TYLENOL Take 500-1,000 mg by mouth every 6 (six) hours as needed for mild pain or moderate pain.   albuterol 108 (90 Base) MCG/ACT inhaler Commonly known as: VENTOLIN HFA Inhale 2 puffs into the lungs every 6 (six) hours as needed for wheezing or shortness of breath.   amLODipine 10 MG tablet Commonly known as: NORVASC Take 1 tablet (10 mg total) by mouth daily.   ammonium lactate 12 % lotion Commonly known as: LAC-HYDRIN Apply 1 application topically daily.   aspirin 81 MG EC tablet Take 1 tablet (81 mg total) by mouth daily.   atorvastatin 80 MG tablet Commonly known as: LIPITOR Take 1 tablet (80 mg total) by mouth daily at 6 PM.   bismuth subsalicylate 122 QM/25OI suspension Commonly known as: PEPTO BISMOL Take 30 mLs by mouth every 6 (six) hours as needed for indigestion or diarrhea or loose stools.   carvedilol 25 MG tablet Commonly known as: COREG Take 25 mg by mouth 2 (two) times daily.   cloNIDine 0.1 MG tablet Commonly known as: CATAPRES Take 0.1 mg by mouth 2 (two) times daily. (Non-dialysis days)   Dulera 200-5 MCG/ACT Aero Generic drug: mometasone-formoterol Inhale 2 puffs into the lungs 2 (two) times daily.   furosemide 40 MG tablet Commonly known as: LASIX Take 40 mg by mouth daily.   insulin aspart protamine- aspart (70-30) 100 UNIT/ML injection Commonly known as: NOVOLOG MIX 70/30 Inject 0.15 mLs (15 Units total) into the skin 2 (two) times daily with a meal.   isosorbide mononitrate 30 MG 24 hr tablet Commonly known as: IMDUR Take 30 mg by mouth daily.   loperamide 2 MG tablet Commonly known as: IMODIUM A-D Take 4 mg by mouth 4 (four) times daily as needed for diarrhea or loose stools.   mirtazapine 30 MG tablet Commonly known as: REMERON Take 1 tablet (30 mg total) by mouth at bedtime.   omeprazole 40 MG capsule Commonly known as: PRILOSEC Take 40 mg by mouth daily.   oseltamivir 30 MG capsule Commonly known  as: TAMIFLU Take 1 capsule (30 mg total) by mouth daily for 4 days.   polyethylene glycol 17 g packet Commonly known as: MIRALAX / GLYCOLAX Take 17 g by mouth daily.   predniSONE 20 MG tablet Commonly known as: DELTASONE Take 1 tablet (20 mg total) by mouth daily with breakfast for 1 day. Start taking on: October 30, 2021 Replaces: predniSONE 5 MG (48) Tbpk tablet   topiramate 25 MG tablet Commonly known as: TOPAMAX Take 25 mg by mouth daily.   traZODone 50 MG tablet Commonly known as: DESYREL Take 50 mg by mouth at bedtime.   True Metrix Blood Glucose Test test strip Generic drug: glucose blood   True Metrix Meter w/Device Kit   vitamin B-12 1000 MCG tablet Commonly known as: CYANOCOBALAMIN Take 1,000 mcg by mouth daily.        Follow-up Information     Kirk Ruths, MD. Daphane Shepherd  on 11/03/2021.   Specialty: Internal Medicine Why: 4pm Appointment Contact information: [place] [address] [phone]                Allergies  Allergen Reactions   Hydralazine Hives, Shortness Of Breath, Swelling and Rash    Body aches    Kiwi Extract Hives, Swelling and Rash    Mouth swells    Lisinopril Swelling    Angioedema   Shellfish Allergy Anaphylaxis    Shrimp/lobster  Betadine leaves welts on skin     Betadine [Povidone Iodine] Hives and Rash    LEAVES WELTS ON SKIN   Metformin Diarrhea and Other (See Comments)    GI Intolerance   Tape Itching    Use paper tape whenever possible    Consultations:    Procedures/Studies: CT ABDOMEN PELVIS WO CONTRAST  Result Date: 10/28/2021 CLINICAL DATA:  Abdominal pain. EXAM: CT ABDOMEN AND PELVIS WITHOUT CONTRAST TECHNIQUE: Multidetector CT imaging of the abdomen and pelvis was performed following the standard protocol without IV contrast. COMPARISON:  CT abdomen pelvis dated 09/12/2020. FINDINGS: Evaluation of this exam is limited in the absence of intravenous  contrast. Lower chest: The visualized lung bases are clear. There is cardiomegaly. No intra-abdominal free air or free fluid. Hepatobiliary: The liver is unremarkable. No intrahepatic biliary dilatation. The gallbladder is unremarkable Pancreas: Unremarkable. No pancreatic ductal dilatation or surrounding inflammatory changes. Spleen: Normal in size without focal abnormality. Adrenals/Urinary Tract: The adrenal glands unremarkable. Moderate bilateral renal parenchyma atrophy. There is no hydronephrosis or nephrolithiasis on either side. Ill-defined bilateral renal hypodense lesions are not characterized on this CT. Mild bilateral perinephric stranding. Correlation with urinalysis recommended to exclude UTI. The visualized ureters appear unremarkable. The urinary bladder is collapsed. Stomach/Bowel: There is sigmoid diverticulosis without active inflammatory changes. There is no bowel obstruction or active inflammation. The appendix is normal. Vascular/Lymphatic: Advanced aortoiliac atherosclerotic disease. The IVC is unremarkable. No portal venous gas. There is no adenopathy. Reproductive: The uterus is grossly unremarkable. Other: Small fat containing umbilical hernia. Musculoskeletal: No acute or significant osseous findings. IMPRESSION: 1. No acute intra-abdominal or pelvic pathology. No hydronephrosis or nephrolithiasis. 2. Sigmoid diverticulosis. No bowel obstruction. Normal appendix. 3. Aortic Atherosclerosis (ICD10-I70.0). Electronically Signed   By: Anner Crete M.D.   On: 10/28/2021 03:14   VAS US DUPLEX DIALYSIS ACCESS (AVF,AVG)  Result Date: 10/15/2021 DIALYSIS ACCESS Patient Name:  ARIEANA SOMOZA  Date of Exam:   10/08/2021 Medical Rec #: [id number]        Accession #:    [id number] Date of Birth: 1961/02/18         Patient Gender: F Patient Age:   60 years Exam [facility] Procedure:      VAS US DUPLEX DIALYSIS ACCESS (AVF, AVG) Referring Phys: Eulogio Ditch  --------------------------------------------------------------------------------  Reason for Exam: Routine follow up. Access Site: Right Upper Extremity. Access Type: BrachAx AVG. History: Created 01/28/2021          5/25 Thrombectomy of avg          Pain in legs. Performing Technologist: Concha Norway RVT  Examination Guidelines: A complete evaluation includes B-mode imaging, spectral Doppler, color Doppler, and power Doppler as needed of all accessible portions of each vessel. Unilateral testing is considered an integral part of a complete examination. Limited examinations for reoccurring indications may be performed as noted.  Findings:   +--------------------+----------+-----------------+--------+ AVG  PSV (cm/s)Flow Vol (mL/min)Describe +--------------------+----------+-----------------+--------+ Native artery inflow   268          2549                +--------------------+----------+-----------------+--------+ Arterial anastomosis   354                              +--------------------+----------+-----------------+--------+ Prox graft             171                              +--------------------+----------+-----------------+--------+ Mid graft              111                              +--------------------+----------+-----------------+--------+ Distal graft           147                              +--------------------+----------+-----------------+--------+ Venous anastomosis     134                              +--------------------+----------+-----------------+--------+ Venous outflow         143                              +--------------------+----------+-----------------+--------+ +--------------+------------+----------+---------+---------+-------------------+                 Diameter  Depth (cm)Branching   PSV       Flow Volume                       (cm)                        (cm/s)       (ml/min)        +--------------+------------+----------+---------+---------+-------------------+ Rt Rad Art Dis                                  46                        +--------------+------------+----------+---------+---------+-------------------+ Antegrade                                                                 +--------------+------------+----------+---------+---------+-------------------+  Summary: Patent Right BrachAx AVG with no evidence of abnormalities. No evidence of steal.  *See table(s) above for measurements and observations.  Diagnosing physician: [name] MD Electronically signed by [name] MD on 10/15/2021 at 4:38:01 PM.   --------------------------------------------------------------------------------   Final       Subjective: No shortness of breath, cough, or chest pain.  She feels much better and is excited to go home.  Discharge Exam: Vitals:   10/29/21 0815 10/29/21 0845  BP: (!) 141/70   Pulse: (!) 55   Resp:  18   Temp: 97.7 F (36.5 C)   SpO2: 100% 96%   Vitals:   10/29/21 0309 10/29/21 0543 10/29/21 0815 10/29/21 0845  BP: (!) 145/68  (!) 141/70   Pulse: (!) 57  (!) 55   Resp: _0 Temp: 97.8 F (36.6 C)  97.7 F (36.5 C)   TempSrc: Oral  Oral   SpO2: 100% 100% 100% 96%  Weight:      Height:        General: Pt is alert, awake, not in acute distress Cardiovascular: RRR, S1/S2 +, no rubs, no gallops Respiratory: CTA bilaterally, no wheezing, no rhonchi Abdominal: Soft, NT, ND, bowel sounds + Extremities: no edema, no cyanosis    The results of significant diagnostics from this hospitalization (including imaging, microbiology, ancillary and laboratory) are listed below for reference.     Microbiology: Recent Results (from the past 240 hour(s))  Resp Panel by RT-PCR (Flu A&B, Covid) Nasopharyngeal Swab     Status: Abnormal   Collection Time: 10/28/21  3:31 AM   Specimen: Nasopharyngeal Swab; Nasopharyngeal(NP) swabs in  vial transport medium  Result Value Ref Range Status   SARS Coronavirus 2 by RT PCR NEGATIVE NEGATIVE Final    Comment: (NOTE) SARS-CoV-2 target nucleic acids are NOT DETECTED.  The SARS-CoV-2 RNA is generally detectable in upper respiratory specimens during the acute phase of infection. The lowest concentration of SARS-CoV-2 viral copies this assay can detect is 138 copies/mL. A negative result does not preclude SARS-Cov-2 infection and should not be used as the sole basis for treatment or other patient management decisions. A negative result may occur with  improper specimen collection/handling, submission of specimen other than nasopharyngeal swab, presence of viral mutation(s) within the areas targeted by this assay, and inadequate number of viral copies(<138 copies/mL). A negative result must be combined with clinical observations, patient history, and epidemiological information. The expected result is Negative.  Fact Sheet for Patients:  EntrepreneurPulse.com.au  Fact Sheet for Healthcare Providers:  IncredibleEmployment.be  This test is no t yet approved or cleared by the [place] FDA and  has been authorized for detection and/or diagnosis of SARS-CoV-2 by FDA under an Emergency Use Authorization (EUA). This EUA will remain  in effect (meaning this test can be used) for the duration of the COVID-19 declaration under Section 564(b)(1) of the Act, 21 U.S.C.section 360bbb-3(b)(1), unless the authorization is terminated  or revoked sooner.       Influenza A by PCR POSITIVE (A) NEGATIVE Final   Influenza B by PCR NEGATIVE NEGATIVE Final    Comment: (NOTE) The Xpert Xpress SARS-CoV-2/FLU/RSV plus assay is intended as an aid in the diagnosis of influenza from Nasopharyngeal swab specimens and should not be used as a sole basis for treatment. Nasal washings and aspirates are unacceptable for Xpert Xpress  SARS-CoV-2/FLU/RSV testing.  Fact Sheet for Patients: EntrepreneurPulse.com.au  Fact Sheet for Healthcare Providers: IncredibleEmployment.be  This test is not yet approved or cleared by the [place] FDA and has been authorized for detection and/or diagnosis of SARS-CoV-2 by FDA under an Emergency Use Authorization (EUA). This EUA will remain in effect (meaning this test can be used) for the duration of the COVID-19 declaration under Section 564(b)(1) of the Act, 21 U.S.C. section 360bbb-3(b)(1), unless the authorization is terminated or revoked.  Performed at [facility], [address] [place], [place] [address]      Labs: BNP (last 3 results) Recent Labs    11/01/20 1520  06/12/21 0644  BNP 1,306.3* 6,270.3*   Basic Metabolic Panel: Recent Labs  Lab 10/28/21 0209 10/29/21 0638  NA 135 135  K 3.1* 3.8  CL 100 99  CO2 23 27  GLUCOSE 182* 283*  BUN 33* 25*  CREATININE 6.27* 4.48*  CALCIUM 8.4* 8.2*  PHOS  --  3.9   Liver Function Tests: Recent Labs  Lab 10/28/21 0209  AST 18  ALT 13  ALKPHOS 64  BILITOT 0.8  PROT 6.8  ALBUMIN 3.2*   Recent Labs  Lab 10/28/21 0209  LIPASE 42   No results for input(s): AMMONIA in the last 168 hours. CBC: Recent Labs  Lab 10/28/21 0209 10/29/21 0638  WBC 5.0 3.5*  HGB 12.1 11.3*  HCT 37.6 35.2*  MCV 88.7 86.5  PLT 184 173   Cardiac Enzymes: No results for input(s): CKTOTAL, CKMB, CKMBINDEX, TROPONINI in the last 168 hours. BNP: Invalid input(s): POCBNP CBG: Recent Labs  Lab 10/28/21 0848 10/28/21 1146 10/28/21 2134 10/29/21 0819 10/29/21 1149  GLUCAP 248* 323* 187* 266* 278*   D-Dimer No results for input(s): DDIMER in the last 72 hours. Hgb A1c No results for input(s): HGBA1C in the last 72 hours. Lipid Profile No results for input(s): CHOL, HDL, LDLCALC, TRIG, CHOLHDL, LDLDIRECT in the last 72 hours. Thyroid function studies No results for  input(s): TSH, T4TOTAL, T3FREE, THYROIDAB in the last 72 hours.  Invalid input(s): FREET3 Anemia work up No results for input(s): VITAMINB12, FOLATE, FERRITIN, TIBC, IRON, RETICCTPCT in the last 72 hours. Urinalysis    Component Value Date/Time   COLORURINE STRAW (A) 10/21/2020 0829   APPEARANCEUR CLEAR (A) 10/21/2020 0829   LABSPEC 1.007 10/21/2020 0829   PHURINE 7.0 10/21/2020 0829   GLUCOSEU 150 (A) 10/21/2020 0829   HGBUR NEGATIVE 10/21/2020 0829   BILIRUBINUR NEGATIVE 10/21/2020 0829   KETONESUR NEGATIVE 10/21/2020 0829   PROTEINUR >=300 (A) 10/21/2020 0829   NITRITE NEGATIVE 10/21/2020 0829   LEUKOCYTESUR SMALL (A) 10/21/2020 0829   Sepsis Labs Invalid input(s): PROCALCITONIN,  WBC,  LACTICIDVEN Microbiology Recent Results (from the past 240 hour(s))  Resp Panel by RT-PCR (Flu A&B, Covid) Nasopharyngeal Swab     Status: Abnormal   Collection Time: 10/28/21  3:31 AM   Specimen: Nasopharyngeal Swab; Nasopharyngeal(NP) swabs in vial transport medium  Result Value Ref Range Status   SARS Coronavirus 2 by RT PCR NEGATIVE NEGATIVE Final    Comment: (NOTE) SARS-CoV-2 target nucleic acids are NOT DETECTED.  The SARS-CoV-2 RNA is generally detectable in upper respiratory specimens during the acute phase of infection. The lowest concentration of SARS-CoV-2 viral copies this assay can detect is 138 copies/mL. A negative result does not preclude SARS-Cov-2 infection and should not be used as the sole basis for treatment or other patient management decisions. A negative result may occur with  improper specimen collection/handling, submission of specimen other than nasopharyngeal swab, presence of viral mutation(s) within the areas targeted by this assay, and inadequate number of viral copies(<138 copies/mL). A negative result must be combined with clinical observations, patient history, and epidemiological information. The expected result is Negative.  Fact Sheet for Patients:   EntrepreneurPulse.com.au  Fact Sheet for Healthcare Providers:  IncredibleEmployment.be  This test is no t yet approved or cleared by the [place] FDA and  has been authorized for detection and/or diagnosis of SARS-CoV-2 by FDA under an Emergency Use Authorization (EUA). This EUA will remain  in effect (meaning this test can be used) for the duration of the  COVID-19 declaration under Section 564(b)(1) of the Act, 21 U.S.C.section 360bbb-3(b)(1), unless the authorization is terminated  or revoked sooner.       Influenza A by PCR POSITIVE (A) NEGATIVE Final   Influenza B by PCR NEGATIVE NEGATIVE Final    Comment: (NOTE) The Xpert Xpress SARS-CoV-2/FLU/RSV plus assay is intended as an aid in the diagnosis of influenza from Nasopharyngeal swab specimens and should not be used as a sole basis for treatment. Nasal washings and aspirates are unacceptable for Xpert Xpress SARS-CoV-2/FLU/RSV testing.  Fact Sheet for Patients: EntrepreneurPulse.com.au  Fact Sheet for Healthcare Providers: IncredibleEmployment.be  This test is not yet approved or cleared by the [place] FDA and has been authorized for detection and/or diagnosis of SARS-CoV-2 by FDA under an Emergency Use Authorization (EUA). This EUA will remain in effect (meaning this test can be used) for the duration of the COVID-19 declaration under Section 564(b)(1) of the Act, 21 U.S.C. section 360bbb-3(b)(1), unless the authorization is terminated or revoked.  Performed at [facility], [address], [place], [place] [address]      Time coordinating discharge: Over 30 minutes  SIGNED:   Nolberto Hanlon, MD  [facility] 10/29/2021, 2:55 PM Pager   If 7PM-7AM, please contact night-coverage [email or web address] Password [email or web address]

## 2021-10-30 DIAGNOSIS — Z992 Dependence on renal dialysis: Secondary | ICD-10-CM | POA: Diagnosis not present

## 2021-10-30 DIAGNOSIS — N186 End stage renal disease: Secondary | ICD-10-CM | POA: Diagnosis not present

## 2021-11-02 ENCOUNTER — Other Ambulatory Visit (INDEPENDENT_AMBULATORY_CARE_PROVIDER_SITE_OTHER): Payer: Self-pay | Admitting: Nurse Practitioner

## 2021-11-02 DIAGNOSIS — M79661 Pain in right lower leg: Secondary | ICD-10-CM

## 2021-11-02 DIAGNOSIS — Z992 Dependence on renal dialysis: Secondary | ICD-10-CM | POA: Diagnosis not present

## 2021-11-02 DIAGNOSIS — N186 End stage renal disease: Secondary | ICD-10-CM | POA: Diagnosis not present

## 2021-11-02 DIAGNOSIS — M79662 Pain in left lower leg: Secondary | ICD-10-CM

## 2021-11-03 ENCOUNTER — Ambulatory Visit (INDEPENDENT_AMBULATORY_CARE_PROVIDER_SITE_OTHER): Payer: Medicare HMO

## 2021-11-03 ENCOUNTER — Ambulatory Visit (INDEPENDENT_AMBULATORY_CARE_PROVIDER_SITE_OTHER): Payer: Medicare HMO | Admitting: Nurse Practitioner

## 2021-11-03 ENCOUNTER — Other Ambulatory Visit: Payer: Self-pay

## 2021-11-03 VITALS — BP 168/82 | HR 66 | Ht 66.0 in | Wt 166.0 lb

## 2021-11-03 DIAGNOSIS — N186 End stage renal disease: Secondary | ICD-10-CM | POA: Diagnosis not present

## 2021-11-03 DIAGNOSIS — Z79899 Other long term (current) drug therapy: Secondary | ICD-10-CM | POA: Insufficient documentation

## 2021-11-03 DIAGNOSIS — R0902 Hypoxemia: Secondary | ICD-10-CM | POA: Diagnosis not present

## 2021-11-03 DIAGNOSIS — R1084 Generalized abdominal pain: Secondary | ICD-10-CM | POA: Diagnosis not present

## 2021-11-03 DIAGNOSIS — I10 Essential (primary) hypertension: Secondary | ICD-10-CM | POA: Diagnosis not present

## 2021-11-03 DIAGNOSIS — E1122 Type 2 diabetes mellitus with diabetic chronic kidney disease: Secondary | ICD-10-CM | POA: Diagnosis not present

## 2021-11-03 DIAGNOSIS — R52 Pain, unspecified: Secondary | ICD-10-CM | POA: Diagnosis not present

## 2021-11-03 DIAGNOSIS — Z7951 Long term (current) use of inhaled steroids: Secondary | ICD-10-CM | POA: Diagnosis not present

## 2021-11-03 DIAGNOSIS — Z951 Presence of aortocoronary bypass graft: Secondary | ICD-10-CM | POA: Diagnosis not present

## 2021-11-03 DIAGNOSIS — E1159 Type 2 diabetes mellitus with other circulatory complications: Secondary | ICD-10-CM

## 2021-11-03 DIAGNOSIS — Z794 Long term (current) use of insulin: Secondary | ICD-10-CM | POA: Insufficient documentation

## 2021-11-03 DIAGNOSIS — Z8616 Personal history of COVID-19: Secondary | ICD-10-CM | POA: Diagnosis not present

## 2021-11-03 DIAGNOSIS — J45909 Unspecified asthma, uncomplicated: Secondary | ICD-10-CM | POA: Diagnosis not present

## 2021-11-03 DIAGNOSIS — I2510 Atherosclerotic heart disease of native coronary artery without angina pectoris: Secondary | ICD-10-CM | POA: Insufficient documentation

## 2021-11-03 DIAGNOSIS — M79662 Pain in left lower leg: Secondary | ICD-10-CM | POA: Diagnosis not present

## 2021-11-03 DIAGNOSIS — Z87891 Personal history of nicotine dependence: Secondary | ICD-10-CM | POA: Diagnosis not present

## 2021-11-03 DIAGNOSIS — K529 Noninfective gastroenteritis and colitis, unspecified: Secondary | ICD-10-CM | POA: Insufficient documentation

## 2021-11-03 DIAGNOSIS — J441 Chronic obstructive pulmonary disease with (acute) exacerbation: Secondary | ICD-10-CM | POA: Diagnosis not present

## 2021-11-03 DIAGNOSIS — E1169 Type 2 diabetes mellitus with other specified complication: Secondary | ICD-10-CM | POA: Diagnosis not present

## 2021-11-03 DIAGNOSIS — I5023 Acute on chronic systolic (congestive) heart failure: Secondary | ICD-10-CM | POA: Insufficient documentation

## 2021-11-03 DIAGNOSIS — M79604 Pain in right leg: Secondary | ICD-10-CM | POA: Diagnosis not present

## 2021-11-03 DIAGNOSIS — M79605 Pain in left leg: Secondary | ICD-10-CM

## 2021-11-03 DIAGNOSIS — E785 Hyperlipidemia, unspecified: Secondary | ICD-10-CM | POA: Diagnosis not present

## 2021-11-03 DIAGNOSIS — I152 Hypertension secondary to endocrine disorders: Secondary | ICD-10-CM

## 2021-11-03 DIAGNOSIS — R109 Unspecified abdominal pain: Secondary | ICD-10-CM | POA: Diagnosis present

## 2021-11-03 DIAGNOSIS — I132 Hypertensive heart and chronic kidney disease with heart failure and with stage 5 chronic kidney disease, or end stage renal disease: Secondary | ICD-10-CM | POA: Insufficient documentation

## 2021-11-03 DIAGNOSIS — Z992 Dependence on renal dialysis: Secondary | ICD-10-CM | POA: Diagnosis not present

## 2021-11-03 DIAGNOSIS — M79661 Pain in right lower leg: Secondary | ICD-10-CM | POA: Diagnosis not present

## 2021-11-03 LAB — COMPREHENSIVE METABOLIC PANEL
ALT: 20 U/L (ref 0–44)
AST: 19 U/L (ref 15–41)
Albumin: 3.7 g/dL (ref 3.5–5.0)
Alkaline Phosphatase: 74 U/L (ref 38–126)
Anion gap: 10 (ref 5–15)
BUN: 40 mg/dL — ABNORMAL HIGH (ref 6–20)
CO2: 28 mmol/L (ref 22–32)
Calcium: 8.3 mg/dL — ABNORMAL LOW (ref 8.9–10.3)
Chloride: 101 mmol/L (ref 98–111)
Creatinine, Ser: 5.43 mg/dL — ABNORMAL HIGH (ref 0.44–1.00)
GFR, Estimated: 9 mL/min — ABNORMAL LOW (ref >60)
Glucose, Bld: 237 mg/dL — ABNORMAL HIGH (ref 70–99)
Potassium: 3.8 mmol/L (ref 3.5–5.1)
Sodium: 139 mmol/L (ref 135–145)
Total Bilirubin: 1.2 mg/dL (ref 0.3–1.2)
Total Protein: 7.5 g/dL (ref 6.5–8.1)

## 2021-11-03 LAB — CBC
HCT: 38 % (ref 36.0–46.0)
Hemoglobin: 12.4 g/dL (ref 12.0–15.0)
MCH: 28.4 pg (ref 26.0–34.0)
MCHC: 32.6 g/dL (ref 30.0–36.0)
MCV: 87.2 fL (ref 80.0–100.0)
Platelets: 229 10*3/uL (ref 150–400)
RBC: 4.36 MIL/uL (ref 3.87–5.11)
RDW: 15.1 % (ref 11.5–15.5)
WBC: 7.3 10*3/uL (ref 4.0–10.5)
nRBC: 0 % (ref 0.0–0.2)

## 2021-11-03 LAB — TYPE AND SCREEN
ABO/RH(D): AB POS
Antibody Screen: NEGATIVE

## 2021-11-03 LAB — LIPASE, BLOOD: Lipase: 51 U/L (ref 11–51)

## 2021-11-03 MED ORDER — OXYCODONE-ACETAMINOPHEN 5-325 MG PO TABS
1.0000 | ORAL_TABLET | Freq: Once | ORAL | Status: AC
  Administered 2021-11-03: 1 via ORAL
  Filled 2021-11-03: qty 1

## 2021-11-03 MED ORDER — ONDANSETRON 4 MG PO TBDP
4.0000 mg | ORAL_TABLET | Freq: Once | ORAL | Status: AC
  Administered 2021-11-03: 4 mg via ORAL
  Filled 2021-11-03: qty 1

## 2021-11-03 NOTE — ED Provider Notes (Signed)
Emergency Medicine Provider Triage Evaluation Note  Terry Arroyo , a 60 y.o. female  was evaluated in triage.  Pt complains of abdominal pain x 2 weeks. Noticed dark stool this morning and pain has worsened. Dialysis patient--last dialysis yesterday and was able to complete.   Review of Systems  Positive: Abdominal pain Negative: Fever  Physical Exam  Pulse 65   Temp 98.1 F (36.7 C) (Oral)   Resp 20   SpO2 98%  Gen:   Awake, no distress   Resp:  Normal effort  MSK:   Moves extremities without difficulty.   Other:    Medical Decision Making  Medically screening exam initiated at 1:45 PM.  Appropriate orders placed.  JADON RESSLER was informed that the remainder of the evaluation will be completed by another provider, this initial triage assessment does not replace that evaluation, and the importance of remaining in the ED until their evaluation is complete.   Victorino Dike, FNP 11/03/21 1501    Lucrezia Starch, MD 11/03/21 1524

## 2021-11-03 NOTE — ED Triage Notes (Signed)
Pt comes into the ED via EMS from home with c/o lower abd pain with blood in her stools, N/V.. with 5/10 pain.   195/83 98%RA 68HR

## 2021-11-04 ENCOUNTER — Emergency Department
Admission: EM | Admit: 2021-11-04 | Discharge: 2021-11-04 | Disposition: A | Discharge status: Home / Self Care | Payer: Medicare HMO | Attending: Emergency Medicine | Admitting: Emergency Medicine

## 2021-11-04 DIAGNOSIS — K529 Noninfective gastroenteritis and colitis, unspecified: Secondary | ICD-10-CM

## 2021-11-04 DIAGNOSIS — Z992 Dependence on renal dialysis: Secondary | ICD-10-CM | POA: Diagnosis not present

## 2021-11-04 DIAGNOSIS — N186 End stage renal disease: Secondary | ICD-10-CM | POA: Diagnosis not present

## 2021-11-04 LAB — URINALYSIS, COMPLETE (UACMP) WITH MICROSCOPIC
Bilirubin Urine: NEGATIVE
Glucose, UA: 150 mg/dL — AB
Ketones, ur: NEGATIVE mg/dL
Nitrite: NEGATIVE
Protein, ur: >=300 mg/dL — AB
Specific Gravity, Urine: 1.016 (ref 1.005–1.030)
pH: 7 (ref 5.0–8.0)

## 2021-11-04 MED ORDER — ONDANSETRON 4 MG PO TBDP: 4.0000 mg | ORAL_TABLET | Freq: Three times a day (TID) | ORAL | 0 refills | Status: DC | PRN

## 2021-11-04 MED ORDER — ONDANSETRON HCL 4 MG/2ML IJ SOLN
4.0000 mg | Freq: Once | INTRAMUSCULAR | Status: AC
  Administered 2021-11-04: 4 mg via INTRAVENOUS
  Filled 2021-11-04: qty 2

## 2021-11-04 MED ORDER — LACTATED RINGERS IV BOLUS
500.0000 mL | Freq: Once | INTRAVENOUS | Status: AC
  Administered 2021-11-04: 500 mL via INTRAVENOUS

## 2021-11-04 NOTE — ED Provider Notes (Signed)
[facility] Emergency Department Provider Note  ____________________________________________  Time seen: Approximately 1:01 AM  I have reviewed the triage vital signs and the nursing notes.   HISTORY  Chief Complaint Abdominal Pain and GI Bleeding   HPI Terry Arroyo is a 60 y.o. female with a history of ESRD on HD, COPD, CAD, cerebral aneurysm, hyperlipidemia, CABG, stroke, diabetes, hypertension who presents for evaluation of abdominal pain, vomiting and diarrhea.  Patient recent hospitalized and discharged 6 days ago for the same presentation.  At that hospitalization she was diagnosed with influenza A.  Patient reports that since going home she continues to have diffuse crampy abdominal pain, couple episodes of nonbloody nonbilious emesis a day and diarrhea.  Over the last 24 hours her diarrhea has turned black which made her concerned.  She has no prior history of GI bleed.  She does not take any blood thinners.  She reports that her emesis is yellow with no blood or coffee-ground emesis.  No fever or chills.  She reports that she continues to feel congested and has a mild cough but no chest pain or shortness of breath.  Patient reports that her symptoms are unchanged other than the black stools from her recent admission.   Past Medical History:  Diagnosis Date   Anemia    Angina at rest [facility])    Angioedema 03/20/2021   Lisinopril   Anterior cerebral aneurysm    approximately 5 mm; left MCA    Anxiety    Aortic atherosclerosis (HCC)    Asthma    Blind left eye    COPD (chronic obstructive pulmonary disease) (HCC)    Coronary artery disease    Depression    DOE (dyspnea on exertion)    ESRD (end stage renal disease) (HCC)    Gait instability    GERD (gastroesophageal reflux disease)    HFrEF (heart failure with reduced ejection fraction) ([place])    History of 2019 novel coronavirus disease (COVID-19) 03/2019   [place]    History of DVT (deep vein thrombosis)    Hx of CABG 2018   x 4; performed in [place]   Hyperlipidemia    Hypertension    MI (myocardial infarction) ([place]) 2018   no stents   Pituitary adenoma ([place])    Secondary hyperparathyroidism ([place])    Sleep apnea    non-compliant with nocturnal PAP therapy   Stroke [facility]) 2014   stated affected left side.   T2DM (type 2 diabetes mellitus) ([place])     Patient Active Problem List   Diagnosis Date Noted   Angioedema 10/28/2021   Lactic acidosis    Diabetic polyneuropathy associated with type 2 diabetes mellitus ([place])    Sepsis ([place]) 09/05/2021   Acute CHF (congestive heart failure) ([place]) 09/05/2021   Fluid overload 06/11/2021   Hypokalemia 06/11/2021   Acute pulmonary edema (HCC)    COPD (chronic obstructive pulmonary disease) with emphysema ([place]) 02/09/2021   Chronic sphenoidal sinusitis 01/01/2021   Contact dermatitis and other eczema due to other specified agent 01/01/2021   Hydronephrosis, left 01/01/2021   Other diseases of nasal cavity and sinuses(478.19) 01/01/2021   ESRD (end stage renal disease) ([place])    Reactive thrombocytosis 11/08/2020   Nephrotic syndrome 11/04/2020   SOB (shortness of breath) 11/01/2020   Acute on chronic combined systolic and diastolic CHF (congestive heart failure) ([place]) 10/21/2020   CAD (coronary artery disease) 10/21/2020   Hyperlipidemia associated with type  2 diabetes mellitus ([place]) 10/21/2020   HFrEF (heart failure with reduced ejection fraction) ([place])    Acute respiratory distress    COPD exacerbation ([place]) 10/10/2020   Kidney hematoma 09/13/2020   Essential hypertension    Acute renal failure superimposed on stage 4 chronic kidney disease (HCC)    Hematuria 09/12/2020   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[place]) 09/03/2020   Blind left eye 09/03/2020   Brain aneurysm 09/03/2020   Esotropia 09/03/2020   Lesion of ulnar nerve 09/03/2020   Mixed hyperlipidemia 09/03/2020    Sensory hearing loss, bilateral 09/03/2020   Tear film insufficiency 09/03/2020   Health maintenance examination 06/27/2020   Secondary hyperparathyroidism of renal origin ([place]) 04/24/2020   Anemia in chronic kidney disease 03/10/2020   Benign hypertensive kidney disease with chronic kidney disease 03/10/2020   Hyposmolality and/or hyponatremia 03/10/2020   Stage 3b chronic kidney disease ([place]) 03/10/2020   Calf tenderness    Acute on chronic heart failure ([place]) 02/27/2020   Leg edema    Type 2 diabetes mellitus with hyperlipidemia ([place]) 02/26/2020   CKD (chronic kidney disease) stage 4, GFR 15-29 ml/min (HCC) 02/26/2020   Hyponatremia 02/26/2020   Anasarca 02/26/2020   Proteinuria 02/26/2020   Hypoalbuminemia 02/26/2020   Elevated troponin 02/26/2020   Acute on chronic systolic CHF (congestive heart failure) ([place]) 02/26/2020   Aortic atherosclerosis ([place]) 02/18/2020   Hyperglycemia    Nausea    Hypertensive emergency    Hypertensive urgency 11/24/2019   Chest pain 11/23/2019   OSA (obstructive sleep apnea) 04/10/2019   Mild episode of recurrent major depressive disorder ([place]) 04/10/2019   ACS (acute coronary syndrome) ([place]) 03/24/2019   COVID-19 virus infection 03/24/2019   Hypertensive heart/kidney disease w/chronic kidney disease stage III ([place]) 10/21/2017   Mild nonproliferative diabetic retinopathy of right eye associated with type 2 diabetes mellitus ([place]) 09/09/2017   Genital herpes 05/07/2017   DVT (deep venous thrombosis) ([place]) 12/30/2016   PVD (posterior vitreous detachment), right eye 01/09/2016   Diabetic peripheral angiopathy ([place]) 01/03/2016   Age-related nuclear cataract of both eyes 10/24/2015   Chronic nonintractable headache 10/02/2015   Dry eyes, bilateral 02/13/2015   Proptosis 12/18/2014   Iritis of right eye 12/03/2014   Anemia 08/30/2014   Depression 08/30/2014    Past Surgical History:  Procedure Laterality Date   AV FISTULA PLACEMENT Right  01/28/2021   Procedure: ARTERIOVENOUS (AV) FISTULA CREATION;  Surgeon: Katha Cabal, MD;  Location: [facility] ORS;  Service: Vascular;  Laterality: Right;   AV FISTULA PLACEMENT Right 05/13/2021   Procedure: INSERTION OF ARTERIOVENOUS (AV) GORE-TEX GRAFT ARM ( BRACHIAL AXILLARY );  Surgeon: Katha Cabal, MD;  Location: [facility] ORS;  Service: Vascular;  Laterality: Right;   [place]   pituitary tumor   BREAST EXCISIONAL BIOPSY Left yrs ago   benign   CESAREAN SECTION     CORONARY ARTERY BYPASS GRAFT  2018   x 4 vessels; performed in [place] N/A 11/07/2020   Procedure: DIALYSIS/PERMA CATHETER INSERTION;  Surgeon: Algernon Huxley, MD;  Location: [place] [facility];  Service: Cardiovascular;  Laterality: N/A;   DIALYSIS/PERMA CATHETER REMOVAL N/A 06/12/2021   Procedure: DIALYSIS/PERMA CATHETER REMOVAL;  Surgeon: Algernon Huxley, MD;  Location: [place] [facility];  Service: Cardiovascular;  Laterality: N/A;   EYE SURGERY Right 2021   cataracts    PERIPHERAL VASCULAR THROMBECTOMY Right 04/03/2021   Procedure: A/V Fistulagram ;  Surgeon: Katha Cabal, MD;  Location: [place] [facility];  Service: Cardiovascular;  Laterality: Right;    Prior to Admission medications   Medication Sig Start Date End Date Taking? Authorizing Provider  ondansetron (ZOFRAN ODT) 4 MG disintegrating tablet Take 1 tablet (4 mg total) by mouth every 8 (eight) hours as needed. 11/04/21  Yes Alfred Levins, [place], MD  acetaminophen (TYLENOL) 500 MG tablet Take 500-1,000 mg by mouth every 6 (six) hours as needed for mild pain or moderate pain.    [provider]  albuterol (VENTOLIN HFA) 108 (90 Base) MCG/ACT inhaler Inhale 2 puffs into the lungs every 6 (six) hours as needed for wheezing or shortness of breath.    [provider]  amLODipine (NORVASC) 10 MG tablet Take 1 tablet (10 mg total) by mouth daily. 03/03/20   Lorella Nimrod, MD  ammonium lactate  (LAC-HYDRIN) 12 % lotion Apply 1 application topically daily.    [provider]  aspirin 81 MG EC tablet Take 1 tablet (81 mg total) by mouth daily. 02/10/21   Max Sane, MD  atorvastatin (LIPITOR) 80 MG tablet Take 1 tablet (80 mg total) by mouth daily at 6 PM. 11/26/19   Lavina Hamman, MD  bismuth subsalicylate (PEPTO BISMOL) 262 MG/15ML suspension Take 30 mLs by mouth every 6 (six) hours as needed for indigestion or diarrhea or loose stools.    [provider]  Blood Glucose Monitoring Suppl (TRUE METRIX METER) w/Device KIT  09/02/21   [provider]  carvedilol (COREG) 25 MG tablet Take 25 mg by mouth 2 (two) times daily.    [provider]  cloNIDine (CATAPRES) 0.1 MG tablet Take 0.1 mg by mouth 2 (two) times daily. (Non-dialysis days)    [provider]  furosemide (LASIX) 40 MG tablet Take 40 mg by mouth daily.    [provider]  gabapentin (NEURONTIN) 100 MG capsule Take by mouth. 10/14/21   [provider]  insulin aspart protamine- aspart (NOVOLOG MIX 70/30) (70-30) 100 UNIT/ML injection Inject 0.15 mLs (15 Units total) into the skin 2 (two) times daily with a meal. 11/12/20   Fritzi Mandes, MD  isosorbide mononitrate (IMDUR) 30 MG 24 hr tablet Take 30 mg by mouth daily.    [provider]  loperamide (IMODIUM A-D) 2 MG tablet Take 4 mg by mouth 4 (four) times daily as needed for diarrhea or loose stools.    [provider]  mirtazapine (REMERON) 30 MG tablet Take 1 tablet (30 mg total) by mouth at bedtime. 11/26/19   Lavina Hamman, MD  mometasone-formoterol [facility]) 200-5 MCG/ACT AERO Inhale 2 puffs into the lungs 2 (two) times daily.    [provider]  omeprazole (PRILOSEC) 40 MG capsule Take 40 mg by mouth daily. 09/22/20   [provider]  polyethylene glycol (MIRALAX / GLYCOLAX) 17 g packet Take 17 g by mouth daily. 02/11/21   Lorella Nimrod, MD  topiramate (TOPAMAX) 25 MG tablet Take 25  mg by mouth daily. 08/19/20   [provider]  traZODone (DESYREL) 50 MG tablet Take 50 mg by mouth at bedtime.    [provider]  TRUE METRIX BLOOD GLUCOSE TEST test strip  09/04/21   [provider]  vitamin B-12 (CYANOCOBALAMIN) 1000 MCG tablet Take 1,000 mcg by mouth daily.    [provider]    Allergies Hydralazine, Kiwi extract, Lisinopril, Shellfish allergy, Betadine [povidone iodine], Metformin, and Tape  Family History  Problem Relation Age  of Onset   CAD Mother    CAD Brother    Breast cancer Sister 56    Social History Social History   Tobacco Use   Smoking status: Former    Types: Cigarettes    Quit date: 2018    Years since quitting: 4.8   Smokeless tobacco: Never  Vaping Use   Vaping Use: Never used  Substance Use Topics   Alcohol use: Not Currently   Drug use: Not Currently    Review of Systems  Constitutional: Negative for fever. Eyes: Negative for visual changes. ENT: Negative for sore throat. + congestion Neck: No neck pain  Cardiovascular: Negative for chest pain. Respiratory: Negative for shortness of breath. + cough Gastrointestinal: + diffuse cramping abdominal pain, vomiting and diarrhea. Genitourinary: Negative for dysuria. Musculoskeletal: Negative for back pain. Skin: Negative for rash. Neurological: Negative for headaches, weakness or numbness. Psych: No SI or HI  ____________________________________________   PHYSICAL EXAM:  VITAL SIGNS: ED Triage Vitals  Enc Vitals Group     BP 11/03/21 1344 (!) 177/75     Pulse Rate 11/03/21 1344 65     Resp 11/03/21 1344 20     Temp 11/03/21 1344 98.1 F (36.7 C)     Temp Source 11/03/21 1344 Oral     SpO2 11/03/21 1344 98 %     Weight --      Height --      Head Circumference --      Peak Flow --      Pain Score 11/04/21 0010 10     Pain Loc --      Pain Edu? --      Excl. in [place]? --     Constitutional: Alert and oriented. Well appearing and in  no apparent distress. HEENT:      Head: Normocephalic and atraumatic.         Eyes: Conjunctivae are normal. Sclera is non-icteric.       Mouth/Throat: Mucous membranes are moist.       Neck: Supple with no signs of meningismus. Cardiovascular: Regular rate and rhythm. No murmurs, gallops, or rubs. 2+ symmetrical distal pulses are present in all extremities. No JVD. Respiratory: Normal respiratory effort. Lungs are clear to auscultation bilaterally.  Gastrointestinal: Soft, non tender, and non distended with positive bowel sounds. No rebound or guarding. Genitourinary: No CVA tenderness.  Rectal exam showing black stool guaiac negative Musculoskeletal:  No edema, cyanosis, or erythema of extremities. Neurologic: Normal speech and language. Face is symmetric. Moving all extremities. No gross focal neurologic deficits are appreciated. Skin: Skin is warm, dry and intact. No rash noted. Psychiatric: Mood and affect are normal. Speech and behavior are normal.  ____________________________________________   LABS (all labs ordered are listed, but only abnormal results are displayed)  Labs Reviewed  COMPREHENSIVE METABOLIC PANEL - Abnormal; Notable for the following components:      Result Value   Glucose, Bld 237 (*)    BUN 40 (*)    Creatinine, Ser 5.43 (*)    Calcium 8.3 (*)    GFR, Estimated 9 (*)    All other components within normal limits  URINALYSIS, COMPLETE (UACMP) WITH MICROSCOPIC - Abnormal; Notable for the following components:   Color, Urine YELLOW (*)    APPearance HAZY (*)    Glucose, UA 150 (*)    Hgb urine dipstick SMALL (*)    Protein, ur >=300 (*)    Leukocytes,Ua SMALL (*)    Bacteria, UA RARE (*)  All other components within normal limits  URINE CULTURE  CBC  LIPASE, BLOOD  POC OCCULT BLOOD, ED  POC URINE PREG, ED  TYPE AND SCREEN   ____________________________________________  EKG  none   ____________________________________________  RADIOLOGY  none  ____________________________________________   PROCEDURES  Procedure(s) performed:yes .1-3 Lead EKG Interpretation Performed by: Rudene Re, MD Authorized by: Rudene Re, MD     Interpretation: non-specific     ECG rate assessment: normal     Rhythm: sinus rhythm     Ectopy: none     Conduction: normal     Critical Care performed:  None ____________________________________________   INITIAL IMPRESSION / ASSESSMENT AND PLAN / ED COURSE  60 y.o. female with a history of ESRD on HD, COPD, CAD, cerebral aneurysm, hyperlipidemia, CABG, stroke, diabetes, hypertension who presents for evaluation of abdominal pain, vomiting and diarrhea x 2 weeks, diagnosed with Influenza A last week. Persistent symptoms of abd cramping, N/V, and now black diarrhea which prompted her visit back to the ED. patient is extremely well-appearing in no distress with normal vital signs, normal work of breathing normal sats, lungs are clear to auscultation, abdomen is soft and nontender, rectal exam showing black stool guaiac negative.  Patient has normal sats both at rest and with ambulation.  I reviewed the chart from patient's recent admission a week ago where she presented with similar symptoms of cramping abdominal pain, vomiting and diarrhea.  She tested positive for influenza.  She did have a CT scan done during that visit which was unremarkable.  Therefore with similar symptoms I do not believe patient warrants any further imaging at this time.  This is possible continuation of her influenza induced gastroenteritis.  She looks slightly dry on exam and had dialysis yesterday.  She is scheduled for dialysis this morning.  We will give her gentle IV hydration.  Patient does make urine therefore we will check a UA for any signs of UTI.  Blood work showing no signs of sepsis, no anion gap acidosis, no electrolyte derangements, no  anemia.  Lipase and LFTs are within normal limits.  Patient was placed on telemetry for monitoring of cardiorespiratory status.  Telemetry strip was reviewed by me. No signs of GI bleed. No signs of respiratory distress. Will PO challenge after IVF and zofran  _________________________ 2:28 AM on 11/04/2021 ----------------------------------------- Patient reassessed.  Remains well-appearing in no respiratory distress with soft nontender abdomen.  Tolerating p.o. no further episodes of vomiting.  Plan to discharge home on a bland diet, Zofran for nausea and vomiting, close follow-up with primary care doctor, dialysis in the morning.  Recommended return to the emergency room for difficulty breathing, signs of dehydration, new or worsening abdominal pain.    _____________________________________________ Please note:  Patient was evaluated in Emergency Department today for the symptoms described in the history of present illness. Patient was evaluated in the context of the global COVID-19 pandemic, which necessitated consideration that the patient might be at risk for infection with the SARS-CoV-2 virus that causes COVID-19. Institutional protocols and algorithms that pertain to the evaluation of patients at risk for COVID-19 are in a state of rapid change based on information released by regulatory bodies including the CDC and federal and state organizations. These policies and algorithms were followed during the patient's care in the ED.  Some ED evaluations and interventions may be delayed as a result of limited staffing during the pandemic.   [place] Controlled Substance Database was reviewed by me. ____________________________________________  FINAL CLINICAL IMPRESSION(S) / ED DIAGNOSES   Final diagnoses:  Gastroenteritis      NEW MEDICATIONS STARTED DURING THIS VISIT:  ED Discharge Orders          Ordered    ondansetron (ZOFRAN ODT) 4 MG disintegrating tablet  Every 8 hours PRN         11/04/21 0225             Note:  This document was prepared using Dragon voice recognition software and may include unintentional dictation errors.    Rudene Re, MD 11/04/21 [phone]

## 2021-11-04 NOTE — ED Notes (Signed)
Pt pass road test with spo2 97% room air, pt does report SOB with talking

## 2021-11-04 NOTE — ED Notes (Signed)
PO challenge passed 

## 2021-11-05 LAB — URINE CULTURE: Culture: NO GROWTH

## 2021-11-06 DIAGNOSIS — N186 End stage renal disease: Secondary | ICD-10-CM | POA: Diagnosis not present

## 2021-11-06 DIAGNOSIS — Z992 Dependence on renal dialysis: Secondary | ICD-10-CM | POA: Diagnosis not present

## 2021-11-08 ENCOUNTER — Encounter (INDEPENDENT_AMBULATORY_CARE_PROVIDER_SITE_OTHER): Payer: Self-pay | Admitting: Nurse Practitioner

## 2021-11-08 NOTE — Progress Notes (Signed)
Subjective:    Patient ID: Terry Arroyo, female    DOB: 25-Feb-1961, 60 y.o.   MRN: [id number] Chief Complaint  Patient presents with   Follow-up    Pt conv. [place]    Terry Arroyo returns today for noninvasive studies regarding pain in her lower extremities.  The patient notes that feel that she has pins-and-needles in her lower extremities.  It is from about her knee down.  She notes that her feet feel swollen but they actually are not.  It feels as if she is walking on gravel sometimes.  This has been going on for about a month and a half and it is a constant sensation.  She denies worsening pain with ambulation.  She denies rest pain like symptoms.  She denies any open wounds or ulcerations.  Today the patient has an ABI of 1.11 on the right with 1.07 on the left.  She has strong biphasic waveforms with good toe waveforms bilaterally.   Review of Systems  Neurological:  Positive for numbness.  All other systems reviewed and are negative.     Objective:   Physical Exam Vitals reviewed.  HENT:     Head: Normocephalic.  Cardiovascular:     Rate and Rhythm: Normal rate.     Pulses:          Dorsalis pedis pulses are 1+ on the right side and 1+ on the left side.  Skin:    General: Skin is warm and dry.  Neurological:     Mental Status: She is alert and oriented to person, place, and time.     Gait: Gait abnormal.  Psychiatric:        Mood and Affect: Mood normal.        Behavior: Behavior normal.        Thought Content: Thought content normal.        Judgment: Judgment normal.    BP (!) 168/82   Pulse 66   Ht _0  (1.676 m)   Wt 166 lb (75.3 kg)   BMI 26.79 kg/m   Past Medical History:  Diagnosis Date   Anemia    Angina at rest [facility])    Angioedema 03/20/2021   Lisinopril   Anterior cerebral aneurysm    approximately 5 mm; left MCA    Anxiety    Aortic atherosclerosis (HCC)    Asthma    Blind left eye    COPD (chronic obstructive pulmonary disease) (HCC)     Coronary artery disease    Depression    DOE (dyspnea on exertion)    ESRD (end stage renal disease) (HCC)    Gait instability    GERD (gastroesophageal reflux disease)    HFrEF (heart failure with reduced ejection fraction) ([place])    History of 2019 novel coronavirus disease (COVID-19) 03/2019   [facility] [place]   History of DVT (deep vein thrombosis)    Hx of CABG 2018   x 4; performed in [place]   Hyperlipidemia    Hypertension    MI (myocardial infarction) ([place]) 2018   no stents   Pituitary adenoma ([place])    Secondary hyperparathyroidism ([place])    Sleep apnea    non-compliant with nocturnal PAP therapy   Stroke [facility]) 2014   stated affected left side.   T2DM (type 2 diabetes mellitus) ([place])     Social History   Socioeconomic History   Marital status: Single    Spouse name: Not  on file   Number of children: Not on file   Years of education: Not on file   Highest education level: Not on file  Occupational History    Comment: disabled  Tobacco Use   Smoking status: Former    Types: Cigarettes    Quit date: 2018    Years since quitting: 4.8   Smokeless tobacco: Never  Vaping Use   Vaping Use: Never used  Substance and Sexual Activity   Alcohol use: Not Currently   Drug use: Not Currently   Sexual activity: Not Currently  Other Topics Concern   Not on file  Social History Narrative   Patient lives alone.  Feels safe in her home.   Social Determinants of Health   Financial Resource Strain: Not on file  Food Insecurity: Not on file  Transportation Needs: Not on file  Physical Activity: Not on file  Stress: Not on file  Social Connections: Not on file  Intimate Partner Violence: Not on file    Past Surgical History:  Procedure Laterality Date   AV FISTULA PLACEMENT Right 01/28/2021   Procedure: ARTERIOVENOUS (AV) FISTULA CREATION;  Surgeon: Katha Cabal, MD;  Location: [facility] ORS;  Service: Vascular;  Laterality: Right;   AV FISTULA  PLACEMENT Right 05/13/2021   Procedure: INSERTION OF ARTERIOVENOUS (AV) GORE-TEX GRAFT ARM ( BRACHIAL AXILLARY );  Surgeon: Katha Cabal, MD;  Location: [facility] ORS;  Service: Vascular;  Laterality: Right;   [name]   pituitary tumor   BREAST EXCISIONAL BIOPSY Left yrs ago   benign   [place]  2018   x 4 vessels; performed in [place] N/A 11/07/2020   Procedure: DIALYSIS/PERMA CATHETER INSERTION;  Surgeon: Algernon Huxley, MD;  Location: [place] [facility];  Service: Cardiovascular;  Laterality: N/A;   DIALYSIS/PERMA CATHETER REMOVAL N/A 06/12/2021   Procedure: DIALYSIS/PERMA CATHETER REMOVAL;  Surgeon: Algernon Huxley, MD;  Location: [place] [facility];  Service: Cardiovascular;  Laterality: N/A;   EYE SURGERY Right 2021   cataracts    PERIPHERAL VASCULAR THROMBECTOMY Right 04/03/2021   Procedure: A/V Fistulagram ;  Surgeon: Katha Cabal, MD;  Location: [place] [facility];  Service: Cardiovascular;  Laterality: Right;    Family History  Problem Relation Age of Onset   CAD Mother    CAD Brother    Breast cancer Sister 73    Allergies  Allergen Reactions   Hydralazine Hives, Shortness Of Breath, Swelling and Rash    Body aches    Kiwi Extract Hives, Swelling and Rash    Mouth swells    Lisinopril Swelling    Angioedema   Shellfish Allergy Anaphylaxis    Shrimp/lobster  Betadine leaves welts on skin     Betadine [Povidone Iodine] Hives and Rash    LEAVES WELTS ON SKIN   Metformin Diarrhea and Other (See Comments)    GI Intolerance   Tape Itching    Use paper tape whenever possible    CBC Latest Ref Rng & Units 11/03/2021 10/29/2021 10/28/2021  WBC 4.0 - 10.5 K/uL 7.3 3.5(L) 5.0  Hemoglobin 12.0 - 15.0 g/dL 12.4 11.3(L) 12.1  Hematocrit 36.0 - 46.0 % 38.0 35.2(L) 37.6  Platelets 150 - 400 K/uL 229 173 184      CMP     Component Value Date/Time   NA 139 11/03/2021 1345    NA 134 (L) 11/18/2013 0138  K 3.8 11/03/2021 1345   K 3.8 11/18/2013 0138   CL 101 11/03/2021 1345   CL 98 11/18/2013 0138   CO2 28 11/03/2021 1345   CO2 29 11/18/2013 0138   GLUCOSE 237 (H) 11/03/2021 1345   GLUCOSE 348 (H) 11/18/2013 0138   BUN 40 (H) 11/03/2021 1345   BUN 11 11/18/2013 0138   CREATININE 5.43 (H) 11/03/2021 1345   CREATININE 0.87 11/18/2013 0138   CALCIUM 8.3 (L) 11/03/2021 1345   CALCIUM 9.4 11/18/2013 0138   PROT 7.5 11/03/2021 1345   ALBUMIN 3.7 11/03/2021 1345   AST 19 11/03/2021 1345   ALT 20 11/03/2021 1345   ALKPHOS 74 11/03/2021 1345   BILITOT 1.2 11/03/2021 1345   GFRNONAA 9 (L) 11/03/2021 1345   GFRNONAA >60 11/18/2013 0138   GFRAA 18 (L) 09/15/2020 0403   GFRAA >60 11/18/2013 0138     VAS [place] ABI WITH/WO TBI  Result Date: 11/03/2021  [place] STUDY Patient Name:  SULMA RUFFINO  Date of Exam:   11/03/2021 Medical Rec #: [id number]        Accession #:    [id number] Date of Birth: July 17, 1961         Patient Gender: F Patient Age:   69 years Exam Location:  [facility] Procedure:      VAS [place] ABI WITH/WO TBI Referring Phys: --------------------------------------------------------------------------------  Indications: Rest pain.  Performing Technologist: Concha Norway RVT  Examination Guidelines: A complete evaluation includes at minimum, Doppler waveform signals and systolic blood pressure reading at the level of bilateral brachial, anterior tibial, and posterior tibial arteries, when vessel segments are accessible. Bilateral testing is considered an integral part of a complete examination. Photoelectric Plethysmograph (PPG) waveforms and toe systolic pressure readings are included as required and additional duplex testing as needed. Limited examinations for reoccurring indications may be performed as noted.  ABI Findings: +---------+------------------+-----+--------+--------+ Right    Rt Pressure (mmHg)IndexWaveformComment   +---------+------------------+-----+--------+--------+ Brachial                                HDA      +---------+------------------+-----+--------+--------+ ATA      168                    biphasic1.11     +---------+------------------+-----+--------+--------+ PTA      158               1.05 biphasic         +---------+------------------+-----+--------+--------+ Great Toe131               0.87 Normal           +---------+------------------+-----+--------+--------+ +---------+------------------+-----+--------+-------+ Left     Lt Pressure (mmHg)IndexWaveformComment +---------+------------------+-----+--------+-------+ Brachial 151                                    +---------+------------------+-----+--------+-------+ ATA      162                    biphasic1.07    +---------+------------------+-----+--------+-------+ PTA      151               1.00 biphasic        +---------+------------------+-----+--------+-------+ Great Toe125               0.83 Normal          +---------+------------------+-----+--------+-------+  Summary: Right: Resting right ankle-brachial index is within normal range. No evidence of significant right lower extremity arterial disease. The right toe-brachial index is normal. Left: Resting left ankle-brachial index is within normal range. No evidence of significant left lower extremity arterial disease. The left toe-brachial index is normal.  *See table(s) above for measurements and observations.  Electronically signed by [name] MD on 11/03/2021 at 4:51:54 PM.    Final        Assessment & Plan:   1. Pain in both lower extremities Mild noninvasive studies today are not 100% normal.  Typically not consistent with a sort of pain that the patient is describing.  With the patient's PAD is very mild is more likely microvascular changes due to her dialysis.  Her symptoms are more consistent with neuropathy and we will send the  patient to neurology for further work-up and evaluation.  If their work-up is unrevealing I discussed a possible angiogram with the patient. - Ambulatory referral to Neurology  2. ESRD (end stage renal disease) ([place]) Currently no issues we will continue on routine follow-up scheduled as previously scheduled  3. Hyperlipidemia associated with type 2 diabetes mellitus ([place]) Continue statin as ordered and reviewed, no changes at this time   4. Hypertension associated with diabetes ([place]) Continue antihypertensive medications as already ordered, these medications have been reviewed and there are no changes at this time.    Current Outpatient Medications on File Prior to Visit  Medication Sig Dispense Refill   acetaminophen (TYLENOL) 500 MG tablet Take 500-1,000 mg by mouth every 6 (six) hours as needed for mild pain or moderate pain.     albuterol (VENTOLIN HFA) 108 (90 Base) MCG/ACT inhaler Inhale 2 puffs into the lungs every 6 (six) hours as needed for wheezing or shortness of breath.     amLODipine (NORVASC) 10 MG tablet Take 1 tablet (10 mg total) by mouth daily. 30 tablet 0   ammonium lactate (LAC-HYDRIN) 12 % lotion Apply 1 application topically daily.     aspirin 81 MG EC tablet Take 1 tablet (81 mg total) by mouth daily. 30 tablet 0   atorvastatin (LIPITOR) 80 MG tablet Take 1 tablet (80 mg total) by mouth daily at 6 PM. 30 tablet 0   bismuth subsalicylate (PEPTO BISMOL) 262 MG/15ML suspension Take 30 mLs by mouth every 6 (six) hours as needed for indigestion or diarrhea or loose stools.     Blood Glucose Monitoring Suppl (TRUE METRIX METER) w/Device KIT      carvedilol (COREG) 25 MG tablet Take 25 mg by mouth 2 (two) times daily.     cloNIDine (CATAPRES) 0.1 MG tablet Take 0.1 mg by mouth 2 (two) times daily. (Non-dialysis days)     furosemide (LASIX) 40 MG tablet Take 40 mg by mouth daily.     gabapentin (NEURONTIN) 100 MG capsule Take by mouth.     insulin aspart protamine- aspart  (NOVOLOG MIX 70/30) (70-30) 100 UNIT/ML injection Inject 0.15 mLs (15 Units total) into the skin 2 (two) times daily with a meal. 10 mL 0   isosorbide mononitrate (IMDUR) 30 MG 24 hr tablet Take 30 mg by mouth daily.     loperamide (IMODIUM A-D) 2 MG tablet Take 4 mg by mouth 4 (four) times daily as needed for diarrhea or loose stools.     mirtazapine (REMERON) 30 MG tablet Take 1 tablet (30 mg total) by mouth at bedtime. 30 tablet 0   mometasone-formoterol (DULERA) 200-5 MCG/ACT AERO Inhale  2 puffs into the lungs 2 (two) times daily.     omeprazole (PRILOSEC) 40 MG capsule Take 40 mg by mouth daily.     polyethylene glycol (MIRALAX / GLYCOLAX) 17 g packet Take 17 g by mouth daily. 14 each 0   topiramate (TOPAMAX) 25 MG tablet Take 25 mg by mouth daily.     traZODone (DESYREL) 50 MG tablet Take 50 mg by mouth at bedtime.     TRUE METRIX BLOOD GLUCOSE TEST test strip      vitamin B-12 (CYANOCOBALAMIN) 1000 MCG tablet Take 1,000 mcg by mouth daily.     No current facility-administered medications on file prior to visit.    There are no Patient Instructions on file for this visit. No follow-ups on file.   Kris Hartmann, NP

## 2021-11-09 DIAGNOSIS — N186 End stage renal disease: Secondary | ICD-10-CM | POA: Diagnosis not present

## 2021-11-09 DIAGNOSIS — Z992 Dependence on renal dialysis: Secondary | ICD-10-CM | POA: Diagnosis not present

## 2021-11-10 DIAGNOSIS — E1122 Type 2 diabetes mellitus with diabetic chronic kidney disease: Secondary | ICD-10-CM | POA: Diagnosis not present

## 2021-11-10 DIAGNOSIS — J101 Influenza due to other identified influenza virus with other respiratory manifestations: Secondary | ICD-10-CM | POA: Diagnosis not present

## 2021-11-10 DIAGNOSIS — R609 Edema, unspecified: Secondary | ICD-10-CM | POA: Diagnosis not present

## 2021-11-10 DIAGNOSIS — I2510 Atherosclerotic heart disease of native coronary artery without angina pectoris: Secondary | ICD-10-CM | POA: Diagnosis not present

## 2021-11-10 DIAGNOSIS — R0602 Shortness of breath: Secondary | ICD-10-CM | POA: Diagnosis not present

## 2021-11-10 DIAGNOSIS — R0609 Other forms of dyspnea: Secondary | ICD-10-CM | POA: Diagnosis not present

## 2021-11-10 DIAGNOSIS — Z951 Presence of aortocoronary bypass graft: Secondary | ICD-10-CM | POA: Diagnosis not present

## 2021-11-10 DIAGNOSIS — J40 Bronchitis, not specified as acute or chronic: Secondary | ICD-10-CM | POA: Diagnosis not present

## 2021-11-10 DIAGNOSIS — I208 Other forms of angina pectoris: Secondary | ICD-10-CM | POA: Diagnosis not present

## 2021-11-10 DIAGNOSIS — Z09 Encounter for follow-up examination after completed treatment for conditions other than malignant neoplasm: Secondary | ICD-10-CM | POA: Diagnosis not present

## 2021-11-10 DIAGNOSIS — Z992 Dependence on renal dialysis: Secondary | ICD-10-CM | POA: Diagnosis not present

## 2021-11-10 DIAGNOSIS — N186 End stage renal disease: Secondary | ICD-10-CM | POA: Diagnosis not present

## 2021-11-10 DIAGNOSIS — Z794 Long term (current) use of insulin: Secondary | ICD-10-CM | POA: Diagnosis not present

## 2021-11-10 DIAGNOSIS — I700 Atherosclerosis of aorta: Secondary | ICD-10-CM | POA: Diagnosis not present

## 2021-11-10 DIAGNOSIS — I25118 Atherosclerotic heart disease of native coronary artery with other forms of angina pectoris: Secondary | ICD-10-CM | POA: Diagnosis not present

## 2021-11-10 DIAGNOSIS — I10 Essential (primary) hypertension: Secondary | ICD-10-CM | POA: Diagnosis not present

## 2021-11-11 DIAGNOSIS — N186 End stage renal disease: Secondary | ICD-10-CM | POA: Diagnosis not present

## 2021-11-11 DIAGNOSIS — Z992 Dependence on renal dialysis: Secondary | ICD-10-CM | POA: Diagnosis not present

## 2021-11-13 DIAGNOSIS — Z992 Dependence on renal dialysis: Secondary | ICD-10-CM | POA: Diagnosis not present

## 2021-11-13 DIAGNOSIS — N186 End stage renal disease: Secondary | ICD-10-CM | POA: Diagnosis not present

## 2021-11-16 ENCOUNTER — Other Ambulatory Visit (HOSPITAL_COMMUNITY): Payer: Self-pay

## 2021-11-16 DIAGNOSIS — Z992 Dependence on renal dialysis: Secondary | ICD-10-CM | POA: Diagnosis not present

## 2021-11-16 DIAGNOSIS — N186 End stage renal disease: Secondary | ICD-10-CM | POA: Diagnosis not present

## 2021-11-16 NOTE — Progress Notes (Signed)
Today spoke to [place].  She has dialysis today, she never misses her dialysis.  She appears to be in a good mood, discussed Thanksgiving.  She watches her fludis and diet closely.  She denies vomiting any since her ED visit, she takes the zofran.  She has all her medications and aware of how to take them.  She mail orders them.  She denies any problems today such as chest pain, headaches, dizziness or increased shortness of breath.  She states her knee is hurting but she states just old age.  She states will rub some icy hot on it.  She is very familiar with using public transportation to get where she needs to go, she also has a neighbor that they go [employer] together.  Her father lives close by and she is close to him.  She denies needing anything today.  She is aware of up coming appts.  Will continue to visit for heart failure, diet and medication management.   [place] [phone]

## 2021-11-18 DIAGNOSIS — N186 End stage renal disease: Secondary | ICD-10-CM | POA: Diagnosis not present

## 2021-11-18 DIAGNOSIS — Z992 Dependence on renal dialysis: Secondary | ICD-10-CM | POA: Diagnosis not present

## 2021-11-20 DIAGNOSIS — N186 End stage renal disease: Secondary | ICD-10-CM | POA: Diagnosis not present

## 2021-11-20 DIAGNOSIS — Z992 Dependence on renal dialysis: Secondary | ICD-10-CM | POA: Diagnosis not present

## 2021-11-20 DIAGNOSIS — N2581 Secondary hyperparathyroidism of renal origin: Secondary | ICD-10-CM | POA: Diagnosis not present

## 2021-11-23 DIAGNOSIS — Z992 Dependence on renal dialysis: Secondary | ICD-10-CM | POA: Diagnosis not present

## 2021-11-23 DIAGNOSIS — N186 End stage renal disease: Secondary | ICD-10-CM | POA: Diagnosis not present

## 2021-11-23 DIAGNOSIS — N2581 Secondary hyperparathyroidism of renal origin: Secondary | ICD-10-CM | POA: Diagnosis not present

## 2021-11-25 DIAGNOSIS — N2581 Secondary hyperparathyroidism of renal origin: Secondary | ICD-10-CM | POA: Diagnosis not present

## 2021-11-25 DIAGNOSIS — Z992 Dependence on renal dialysis: Secondary | ICD-10-CM | POA: Diagnosis not present

## 2021-11-25 DIAGNOSIS — N186 End stage renal disease: Secondary | ICD-10-CM | POA: Diagnosis not present

## 2021-11-27 DIAGNOSIS — Z992 Dependence on renal dialysis: Secondary | ICD-10-CM | POA: Diagnosis not present

## 2021-11-27 DIAGNOSIS — N186 End stage renal disease: Secondary | ICD-10-CM | POA: Diagnosis not present

## 2021-11-27 DIAGNOSIS — N2581 Secondary hyperparathyroidism of renal origin: Secondary | ICD-10-CM | POA: Diagnosis not present

## 2021-11-30 DIAGNOSIS — N2581 Secondary hyperparathyroidism of renal origin: Secondary | ICD-10-CM | POA: Diagnosis not present

## 2021-11-30 DIAGNOSIS — N186 End stage renal disease: Secondary | ICD-10-CM | POA: Diagnosis not present

## 2021-11-30 DIAGNOSIS — Z992 Dependence on renal dialysis: Secondary | ICD-10-CM | POA: Diagnosis not present

## 2021-12-02 ENCOUNTER — Ambulatory Visit: Payer: Medicare HMO | Admitting: Family

## 2021-12-02 DIAGNOSIS — N186 End stage renal disease: Secondary | ICD-10-CM | POA: Diagnosis not present

## 2021-12-02 DIAGNOSIS — N2581 Secondary hyperparathyroidism of renal origin: Secondary | ICD-10-CM | POA: Diagnosis not present

## 2021-12-02 DIAGNOSIS — Z992 Dependence on renal dialysis: Secondary | ICD-10-CM | POA: Diagnosis not present

## 2021-12-03 ENCOUNTER — Encounter (INDEPENDENT_AMBULATORY_CARE_PROVIDER_SITE_OTHER): Payer: Self-pay | Admitting: Nurse Practitioner

## 2021-12-03 ENCOUNTER — Other Ambulatory Visit (INDEPENDENT_AMBULATORY_CARE_PROVIDER_SITE_OTHER): Payer: Medicare HMO

## 2021-12-03 ENCOUNTER — Ambulatory Visit (INDEPENDENT_AMBULATORY_CARE_PROVIDER_SITE_OTHER): Payer: Medicare HMO | Admitting: Nurse Practitioner

## 2021-12-03 ENCOUNTER — Other Ambulatory Visit: Payer: Self-pay

## 2021-12-03 ENCOUNTER — Other Ambulatory Visit (INDEPENDENT_AMBULATORY_CARE_PROVIDER_SITE_OTHER): Payer: Self-pay | Admitting: Nurse Practitioner

## 2021-12-03 VITALS — BP 126/70 | HR 65 | Ht 66.0 in | Wt 165.0 lb

## 2021-12-03 DIAGNOSIS — I152 Hypertension secondary to endocrine disorders: Secondary | ICD-10-CM | POA: Diagnosis not present

## 2021-12-03 DIAGNOSIS — E1169 Type 2 diabetes mellitus with other specified complication: Secondary | ICD-10-CM | POA: Diagnosis not present

## 2021-12-03 DIAGNOSIS — E785 Hyperlipidemia, unspecified: Secondary | ICD-10-CM | POA: Diagnosis not present

## 2021-12-03 DIAGNOSIS — N186 End stage renal disease: Secondary | ICD-10-CM | POA: Diagnosis not present

## 2021-12-03 DIAGNOSIS — E1159 Type 2 diabetes mellitus with other circulatory complications: Secondary | ICD-10-CM

## 2021-12-04 DIAGNOSIS — N186 End stage renal disease: Secondary | ICD-10-CM | POA: Diagnosis not present

## 2021-12-04 DIAGNOSIS — Z992 Dependence on renal dialysis: Secondary | ICD-10-CM | POA: Diagnosis not present

## 2021-12-04 DIAGNOSIS — N2581 Secondary hyperparathyroidism of renal origin: Secondary | ICD-10-CM | POA: Diagnosis not present

## 2021-12-07 ENCOUNTER — Ambulatory Visit: Payer: Medicare HMO | Admitting: Family

## 2021-12-07 DIAGNOSIS — N2581 Secondary hyperparathyroidism of renal origin: Secondary | ICD-10-CM | POA: Diagnosis not present

## 2021-12-07 DIAGNOSIS — Z992 Dependence on renal dialysis: Secondary | ICD-10-CM | POA: Diagnosis not present

## 2021-12-07 DIAGNOSIS — N186 End stage renal disease: Secondary | ICD-10-CM | POA: Diagnosis not present

## 2021-12-09 DIAGNOSIS — Z992 Dependence on renal dialysis: Secondary | ICD-10-CM | POA: Diagnosis not present

## 2021-12-09 DIAGNOSIS — N186 End stage renal disease: Secondary | ICD-10-CM | POA: Diagnosis not present

## 2021-12-09 DIAGNOSIS — N2581 Secondary hyperparathyroidism of renal origin: Secondary | ICD-10-CM | POA: Diagnosis not present

## 2021-12-10 DIAGNOSIS — Z794 Long term (current) use of insulin: Secondary | ICD-10-CM | POA: Diagnosis not present

## 2021-12-10 DIAGNOSIS — L97511 Non-pressure chronic ulcer of other part of right foot limited to breakdown of skin: Secondary | ICD-10-CM | POA: Diagnosis not present

## 2021-12-10 DIAGNOSIS — E1140 Type 2 diabetes mellitus with diabetic neuropathy, unspecified: Secondary | ICD-10-CM | POA: Diagnosis not present

## 2021-12-11 DIAGNOSIS — N2581 Secondary hyperparathyroidism of renal origin: Secondary | ICD-10-CM | POA: Diagnosis not present

## 2021-12-11 DIAGNOSIS — Z992 Dependence on renal dialysis: Secondary | ICD-10-CM | POA: Diagnosis not present

## 2021-12-11 DIAGNOSIS — N186 End stage renal disease: Secondary | ICD-10-CM | POA: Diagnosis not present

## 2021-12-13 ENCOUNTER — Encounter (INDEPENDENT_AMBULATORY_CARE_PROVIDER_SITE_OTHER): Payer: Self-pay | Admitting: Nurse Practitioner

## 2021-12-13 NOTE — Progress Notes (Signed)
° °Subjective:  ° ° Patient ID: Terry Arroyo, female    DOB: 05/12/1961, 60 y.o.   MRN: [id number] °Chief Complaint  °Patient presents with  ° Follow-up  °  6 mo HDA  ° ° °The patient returns to the office for follow up regarding problem with the dialysis access. Currently the patient is maintained via a right brachiocephalic AV fistula . ° °The patient notes a significant increase in bleeding time after decannulation.  The patient has also been informed that there is increased recirculation.    The patient denies hand pain or other symptoms consistent with steal phenomena.  No significant arm swelling. ° °The patient denies redness or swelling at the access site. The patient denies fever or chills at home or while on dialysis. ° °The patient denies amaurosis fugax or recent TIA symptoms. There are no recent neurological changes noted. °The patient denies claudication symptoms or rest pain symptoms. °The patient denies history of DVT, PE or superficial thrombophlebitis. °The patient denies recent episodes of angina or shortness of breath.  ° ° °Today the patient is a flow volume of 1661.  Previous flow volume was 2549.  No evidence of stenosis noted. ° ° °Review of Systems  °Hematological:  Bruises/bleeds easily.  °All other systems reviewed and are negative. ° °   °Objective:  ° Physical Exam °Vitals reviewed.  °HENT:  °   Head: Normocephalic.  °Cardiovascular:  °   Rate and Rhythm: Normal rate.  °   Pulses:     °     Radial pulses are 2+ on the right side.  °Pulmonary:  °   Effort: Pulmonary effort is normal.  °Neurological:  °   Mental Status: She is alert and oriented to person, place, and time.  °Psychiatric:     °   Mood and Affect: Mood normal.     °   Behavior: Behavior normal.     °   Thought Content: Thought content normal.     °   Judgment: Judgment normal.  ° ° °BP 126/70    Pulse 65    Ht 5' 6" (1.676 m)    Wt 165 lb (74.8 kg)    BMI 26.63 kg/m²  ° °Past Medical History:  °Diagnosis Date  ° Anemia   °  Angina at rest (HCC)   ° Angioedema 03/20/2021  ° Lisinopril  ° Anterior cerebral aneurysm   ° approximately 5 mm; left MCA   ° Anxiety   ° Aortic atherosclerosis (HCC)   ° Asthma   ° Blind left eye   ° COPD (chronic obstructive pulmonary disease) (HCC)   ° Coronary artery disease   ° Depression   ° DOE (dyspnea on exertion)   ° ESRD (end stage renal disease) (HCC)   ° Gait instability   ° GERD (gastroesophageal reflux disease)   ° HFrEF (heart failure with reduced ejection fraction) (HCC)   ° History of 2019 novel coronavirus disease (COVID-19) 03/2019  ° [facility] [place]  ° History of DVT (deep vein thrombosis)   ° Hx of CABG 2018  ° x 4; performed in [place]  ° Hyperlipidemia   ° Hypertension   ° MI (myocardial infarction) (HCC) 2018  ° no stents  ° Pituitary adenoma (HCC)   ° Secondary hyperparathyroidism (HCC)   ° Sleep apnea   ° non-compliant with nocturnal PAP therapy  ° Stroke (HCC) 2014  ° stated affected left side.  ° T2DM (type   2 diabetes mellitus) (HCC)   ° ° °Social History  ° °Socioeconomic History  ° Marital status: Single  °  Spouse name: Not on file  ° Number of children: Not on file  ° Years of education: Not on file  ° Highest education level: Not on file  °Occupational History  °  Comment: disabled  °Tobacco Use  ° Smoking status: Former  °  Types: Cigarettes  °  Quit date: 2018  °  Years since quitting: 4.9  ° Smokeless tobacco: Never  °Vaping Use  ° Vaping Use: Never used  °Substance and Sexual Activity  ° Alcohol use: Not Currently  ° Drug use: Not Currently  ° Sexual activity: Not Currently  °Other Topics Concern  ° Not on file  °Social History Narrative  ° Patient lives alone.  Feels safe in her home.  ° °Social Determinants of Health  ° °Financial Resource Strain: Not on file  °Food Insecurity: Not on file  °Transportation Needs: Not on file  °Physical Activity: Not on file  °Stress: Not on file  °Social Connections: Not on file  °Intimate Partner Violence: Not on file   ° ° °Past Surgical History:  °Procedure Laterality Date  ° AV FISTULA PLACEMENT Right 01/28/2021  ° Procedure: ARTERIOVENOUS (AV) FISTULA CREATION;  Surgeon: Schnier, Gregory G, MD;  Location: [facility] ORS;  Service: Vascular;  Laterality: Right;  ° AV FISTULA PLACEMENT Right 05/13/2021  ° Procedure: INSERTION OF ARTERIOVENOUS (AV) GORE-TEX GRAFT ARM ( BRACHIAL AXILLARY );  Surgeon: Schnier, Gregory G, MD;  Location: [facility] OR[facility];  Service: Vascular;  Laterality: Right;  ° BRAIN SURGERY  1986  ° pituitary tumor  ° BREAST EXCISIONAL BIOPSY Left yrs ago  ° benign  ° CESAREAN SECTION    ° CORONARY ARTERY BYPASS GRAFT  2018  ° x 4 vessels; performed in [place]  ° DIALYSIS/PERMA CATHETER INSERTION N/A 11/07/2020  ° Procedure: DIALYSIS/PERMA CATHETER INSERTION;  Surgeon: Dew, Jason S, MD;  Location: [facility];  Service: Cardiovascular;  Laterality: N/A;  ° DIALYSIS/PERMA CATHETER REMOVAL N/A 06/12/2021  ° Procedure: DIALYSIS/PERMA CATHETER REMOVAL;  Surgeon: Dew, Jason S, MD;  Location: [facility];  Service: Cardiovascular;  Laterality: N/A;  ° EYE SURGERY Right 2021  ° cataracts   ° PERIPHERAL VASCULAR THROMBECTOMY Right 04/03/2021  ° Procedure: A/V Fistulagram ;  Surgeon: Schnier, Gregory G, MD;  Location: [facility];  Service: Cardiovascular;  Laterality: Right;  ° ° °Family History  °Problem Relation Age of Onset  ° CAD Mother   ° CAD Brother   ° Breast cancer Sister 48  ° ° °Allergies  °Allergen Reactions  ° Hydralazine Hives, Shortness Of Breath, Swelling and Rash  °  Body aches °  ° Kiwi Extract Hives, Swelling and Rash  °  Mouth swells   ° Lisinopril Swelling  °  Angioedema  ° Shellfish Allergy Anaphylaxis  °  Shrimp/lobster  °Betadine leaves welts on skin  °  ° Betadine [Povidone Iodine] Hives and Rash  °  LEAVES WELTS ON SKIN  ° Metformin Diarrhea and Other (See Comments)  °  GI Intolerance  ° Tape Itching  °  Use paper tape whenever possible  ° ° °CBC Latest Ref Rng & Units 11/03/2021  10/29/2021 10/28/2021  °WBC 4.0 - 10.5 K/uL 7.3 3.5(L) 5.0  °Hemoglobin 12.0 - 15.0 g/dL 12.4 11.3(L) 12.1  °Hematocrit 36.0 - 46.0 % 38.0 35.2(L) 37.6  °Platelets 150 - 400 K/uL 229 173 184  ° ° ° ° °  CMP  °   °Component Value Date/Time  ° NA 139 11/03/2021 1345  ° NA 134 (L) 11/18/2013 0138  ° K 3.8 11/03/2021 1345  ° K 3.8 11/18/2013 0138  ° CL 101 11/03/2021 1345  ° CL 98 11/18/2013 0138  ° CO2 28 11/03/2021 1345  ° CO2 29 11/18/2013 0138  ° GLUCOSE 237 (H) 11/03/2021 1345  ° GLUCOSE 348 (H) 11/18/2013 0138  ° BUN 40 (H) 11/03/2021 1345  ° BUN 11 11/18/2013 0138  ° CREATININE 5.43 (H) 11/03/2021 1345  ° CREATININE 0.87 11/18/2013 0138  ° CALCIUM 8.3 (L) 11/03/2021 1345  ° CALCIUM 9.4 11/18/2013 0138  ° PROT 7.5 11/03/2021 1345  ° ALBUMIN 3.7 11/03/2021 1345  ° AST 19 11/03/2021 1345  ° ALT 20 11/03/2021 1345  ° ALKPHOS 74 11/03/2021 1345  ° BILITOT 1.2 11/03/2021 1345  ° GFRNONAA 9 (L) 11/03/2021 1345  ° GFRNONAA >60 11/18/2013 0138  ° GFRAA 18 (L) 09/15/2020 0403  ° GFRAA >60 11/18/2013 0138  ° ° ° °VAS US ABI WITH/WO TBI ° °Result Date: 11/03/2021 ° LOWER EXTREMITY DOPPLER STUDY Patient Name:  Anasofia R Kister  Date of Exam:   11/03/2021 Medical Rec #: [id number]        Accession #:    [id number] Date of Birth: 08/04/1961         Patient Gender: F Patient Age:   60 years Exam Location:  [facility] Procedure:      VAS US ABI WITH/WO TBI Referring Phys: --------------------------------------------------------------------------------  Indications: Rest pain.  Performing Technologist: Terry Knight RVT  Examination Guidelines: A complete evaluation includes at minimum, Doppler waveform signals and systolic blood pressure reading at the level of bilateral brachial, anterior tibial, and posterior tibial arteries, when vessel segments are accessible. Bilateral testing is considered an integral part of a complete examination. Photoelectric Plethysmograph (PPG) waveforms and toe systolic pressure readings are  included as required and additional duplex testing as needed. Limited examinations for reoccurring indications may be performed as noted.  ABI Findings: +---------+------------------+-----+--------+--------+  Right     Rt Pressure (mmHg) Index Waveform Comment   +---------+------------------+-----+--------+--------+  Brachial                                    HDA       +---------+------------------+-----+--------+--------+  ATA       168                      biphasic 1.11      +---------+------------------+-----+--------+--------+  PTA       158                1.05  biphasic           +---------+------------------+-----+--------+--------+  Great Toe 131                0.87  Normal             +---------+------------------+-----+--------+--------+ +---------+------------------+-----+--------+-------+  Left      Lt Pressure (mmHg) Index Waveform Comment  +---------+------------------+-----+--------+-------+  Brachial  151                                        +---------+------------------+-----+--------+-------+  ATA       162                        biphasic 1.07     +---------+------------------+-----+--------+-------+  PTA       151                1.00  biphasic          +---------+------------------+-----+--------+-------+  Great Toe 125                0.83  Normal            +---------+------------------+-----+--------+-------+  Summary: Right: Resting right ankle-brachial index is within normal range. No evidence of significant right lower extremity arterial disease. The right toe-brachial index is normal. Left: Resting left ankle-brachial index is within normal range. No evidence of significant left lower extremity arterial disease. The left toe-brachial index is normal.  *See table(s) above for measurements and observations.  Electronically signed by Gregory Schnier MD on 11/03/2021 at 4:51:54 PM.    Final   ° ° °   °Assessment & Plan:  ° °1. ESRD (end stage renal disease) (HCC) °Recommend: ° °The patient is  experiencing increasing problems with their dialysis access. ° °Patient should have a fistulagram with the intention for intervention.  The intention for intervention is to restore appropriate flow and prevent thrombosis and possible loss of the access.  As well as improve the quality of dialysis therapy. ° °The risks, benefits and alternative therapies were reviewed in detail with the patient.  All questions were answered.  The patient agrees to proceed with angio/intervention.   °  ° °2. Hyperlipidemia associated with type 2 diabetes mellitus (HCC) °Continue statin as ordered and reviewed, no changes at this time  ° °3. Hypertension associated with diabetes (HCC) °Continue antihypertensive medications as already ordered, these medications have been reviewed and there are no changes at this time.  ° ° °Current Outpatient Medications on File Prior to Visit  °Medication Sig Dispense Refill  ° acetaminophen (TYLENOL) 500 MG tablet Take 500-1,000 mg by mouth every 6 (six) hours as needed for mild pain or moderate pain.    ° albuterol (VENTOLIN HFA) 108 (90 Base) MCG/ACT inhaler Inhale 2 puffs into the lungs every 6 (six) hours as needed for wheezing or shortness of breath.    ° amLODipine (NORVASC) 10 MG tablet Take 1 tablet (10 mg total) by mouth daily. 30 tablet 0  ° ammonium lactate (LAC-HYDRIN) 12 % lotion Apply 1 application topically daily.    ° aspirin 81 MG EC tablet Take 1 tablet (81 mg total) by mouth daily. 30 tablet 0  ° atorvastatin (LIPITOR) 80 MG tablet Take 1 tablet (80 mg total) by mouth daily at 6 PM. 30 tablet 0  ° bismuth subsalicylate (PEPTO BISMOL) 262 MG/15ML suspension Take 30 mLs by mouth every 6 (six) hours as needed for indigestion or diarrhea or loose stools.    ° Blood Glucose Monitoring Suppl (TRUE METRIX METER) w/Device KIT     ° carvedilol (COREG) 25 MG tablet Take 25 mg by mouth 2 (two) times daily.    ° cloNIDine (CATAPRES) 0.1 MG tablet Take 0.1 mg by mouth 2 (two) times daily.  (Non-dialysis days)    ° furosemide (LASIX) 40 MG tablet Take 40 mg by mouth daily.    ° gabapentin (NEURONTIN) 100 MG capsule Take by mouth.    ° insulin aspart protamine- aspart (NOVOLOG MIX 70/30) (70-30) 100 UNIT/ML injection Inject 0.15 mLs (15 Units total) into the skin 2 (two) times daily with a meal. 10 mL 0  ° isosorbide mononitrate (IMDUR) 30 MG 24 hr   tablet Take 30 mg by mouth daily.    ° loperamide (IMODIUM A-D) 2 MG tablet Take 4 mg by mouth 4 (four) times daily as needed for diarrhea or loose stools.    ° mirtazapine (REMERON) 30 MG tablet Take 1 tablet (30 mg total) by mouth at bedtime. 30 tablet 0  ° mometasone-formoterol (DULERA) 200-5 MCG/ACT AERO Inhale 2 puffs into the lungs 2 (two) times daily.    ° omeprazole (PRILOSEC) 40 MG capsule Take 40 mg by mouth daily.    ° ondansetron (ZOFRAN ODT) 4 MG disintegrating tablet Take 1 tablet (4 mg total) by mouth every 8 (eight) hours as needed. 20 tablet 0  ° polyethylene glycol (MIRALAX / GLYCOLAX) 17 g packet Take 17 g by mouth daily. 14 each 0  ° topiramate (TOPAMAX) 25 MG tablet Take 25 mg by mouth daily.    ° traZODone (DESYREL) 50 MG tablet Take 50 mg by mouth at bedtime.    ° TRUE METRIX BLOOD GLUCOSE TEST test strip     ° vitamin B-12 (CYANOCOBALAMIN) 1000 MCG tablet Take 1,000 mcg by mouth daily.    ° °No current facility-administered medications on file prior to visit.  ° ° °There are no Patient Instructions on file for this visit. °No follow-ups on file. ° ° °Bobetta Korf E Vitug, NP ° ° °

## 2021-12-13 NOTE — H&P (View-Only) (Signed)
Subjective:    Patient ID: Terry Arroyo, female    DOB: 14-May-1961, 60 y.o.   MRN: [id number] Chief Complaint  Patient presents with   Follow-up    6 mo HDA    The patient returns to the office for follow up regarding problem with the dialysis access. Currently the patient is maintained via a right brachiocephalic AV fistula .  The patient notes a significant increase in bleeding time after decannulation.  The patient has also been informed that there is increased recirculation.    The patient denies hand pain or other symptoms consistent with steal phenomena.  No significant arm swelling.  The patient denies redness or swelling at the access site. The patient denies fever or chills at home or while on dialysis.  The patient denies amaurosis fugax or recent TIA symptoms. There are no recent neurological changes noted. The patient denies claudication symptoms or rest pain symptoms. The patient denies history of DVT, PE or superficial thrombophlebitis. The patient denies recent episodes of angina or shortness of breath.    Today the patient is a flow volume of 1661.  Previous flow volume was 2549.  No evidence of stenosis noted.   Review of Systems  Hematological:  Bruises/bleeds easily.  All other systems reviewed and are negative.     Objective:   Physical Exam Vitals reviewed.  HENT:     Head: Normocephalic.  Cardiovascular:     Rate and Rhythm: Normal rate.     Pulses:          Radial pulses are 2+ on the right side.  Pulmonary:     Effort: Pulmonary effort is normal.  Neurological:     Mental Status: She is alert and oriented to person, place, and time.  Psychiatric:        Mood and Affect: Mood normal.        Behavior: Behavior normal.        Thought Content: Thought content normal.        Judgment: Judgment normal.    BP 126/70    Pulse 65    Ht _0  (1.676 m)    Wt 165 lb (74.8 kg)    BMI 26.63 kg/m   Past Medical History:  Diagnosis Date   Anemia     Angina at rest [facility])    Angioedema 03/20/2021   Lisinopril   Anterior cerebral aneurysm    approximately 5 mm; left MCA    Anxiety    Aortic atherosclerosis (HCC)    Asthma    Blind left eye    COPD (chronic obstructive pulmonary disease) (HCC)    Coronary artery disease    Depression    DOE (dyspnea on exertion)    ESRD (end stage renal disease) (HCC)    Gait instability    GERD (gastroesophageal reflux disease)    HFrEF (heart failure with reduced ejection fraction) ([place])    History of 2019 novel coronavirus disease (COVID-19) 03/2019   [facility] [place]   History of DVT (deep vein thrombosis)    Hx of CABG 2018   x 4; performed in [place]   Hyperlipidemia    Hypertension    MI (myocardial infarction) ([place]) 2018   no stents   Pituitary adenoma ([place])    Secondary hyperparathyroidism ([place])    Sleep apnea    non-compliant with nocturnal PAP therapy   Stroke [facility]) 2014   stated affected left side.   T2DM (type  2 diabetes mellitus) ([place])     Social History   Socioeconomic History   Marital status: Single    Spouse name: Not on file   Number of children: Not on file   Years of education: Not on file   Highest education level: Not on file  Occupational History    Comment: disabled  Tobacco Use   Smoking status: Former    Types: Cigarettes    Quit date: 2018    Years since quitting: 4.9   Smokeless tobacco: Never  Vaping Use   Vaping Use: Never used  Substance and Sexual Activity   Alcohol use: Not Currently   Drug use: Not Currently   Sexual activity: Not Currently  Other Topics Concern   Not on file  Social History Narrative   Patient lives alone.  Feels safe in her home.   Social Determinants of Health   Financial Resource Strain: Not on file  Food Insecurity: Not on file  Transportation Needs: Not on file  Physical Activity: Not on file  Stress: Not on file  Social Connections: Not on file  Intimate Partner Violence: Not on file     Past Surgical History:  Procedure Laterality Date   AV FISTULA PLACEMENT Right 01/28/2021   Procedure: ARTERIOVENOUS (AV) FISTULA CREATION;  Surgeon: Katha Cabal, MD;  Location: [facility] ORS;  Service: Vascular;  Laterality: Right;   AV FISTULA PLACEMENT Right 05/13/2021   Procedure: INSERTION OF ARTERIOVENOUS (AV) GORE-TEX GRAFT ARM ( BRACHIAL AXILLARY );  Surgeon: Katha Cabal, MD;  Location: [facility] ORS;  Service: Vascular;  Laterality: Right;   [place]   pituitary tumor   BREAST EXCISIONAL BIOPSY Left yrs ago   benign   [place]  2018   x 4 vessels; performed in [place] N/A 11/07/2020   Procedure: DIALYSIS/PERMA CATHETER INSERTION;  Surgeon: Algernon Huxley, MD;  Location: [place] CV LAB;  Service: Cardiovascular;  Laterality: N/A;   DIALYSIS/PERMA CATHETER REMOVAL N/A 06/12/2021   Procedure: DIALYSIS/PERMA CATHETER REMOVAL;  Surgeon: Algernon Huxley, MD;  Location: [place] [facility];  Service: Cardiovascular;  Laterality: N/A;   EYE SURGERY Right 2021   cataracts    PERIPHERAL VASCULAR THROMBECTOMY Right 04/03/2021   Procedure: A/V Fistulagram ;  Surgeon: Katha Cabal, MD;  Location: [place] CV LAB;  Service: Cardiovascular;  Laterality: Right;    Family History  Problem Relation Age of Onset   CAD Mother    CAD Brother    Breast cancer Sister 97    Allergies  Allergen Reactions   Hydralazine Hives, Shortness Of Breath, Swelling and Rash    Body aches    Kiwi Extract Hives, Swelling and Rash    Mouth swells    Lisinopril Swelling    Angioedema   Shellfish Allergy Anaphylaxis    Shrimp/lobster  Betadine leaves welts on skin     Betadine [Povidone Iodine] Hives and Rash    LEAVES WELTS ON SKIN   Metformin Diarrhea and Other (See Comments)    GI Intolerance   Tape Itching    Use paper tape whenever possible    CBC Latest Ref Rng & Units 11/03/2021  10/29/2021 10/28/2021  WBC 4.0 - 10.5 K/uL 7.3 3.5(L) 5.0  Hemoglobin 12.0 - 15.0 g/dL 12.4 11.3(L) 12.1  Hematocrit 36.0 - 46.0 % 38.0 35.2(L) 37.6  Platelets 150 - 400 K/uL 229 173 184  CMP     Component Value Date/Time   NA 139 11/03/2021 1345   NA 134 (L) 11/18/2013 0138   K 3.8 11/03/2021 1345   K 3.8 11/18/2013 0138   CL 101 11/03/2021 1345   CL 98 11/18/2013 0138   CO2 28 11/03/2021 1345   CO2 29 11/18/2013 0138   GLUCOSE 237 (H) 11/03/2021 1345   GLUCOSE 348 (H) 11/18/2013 0138   BUN 40 (H) 11/03/2021 1345   BUN 11 11/18/2013 0138   CREATININE 5.43 (H) 11/03/2021 1345   CREATININE 0.87 11/18/2013 0138   CALCIUM 8.3 (L) 11/03/2021 1345   CALCIUM 9.4 11/18/2013 0138   PROT 7.5 11/03/2021 1345   ALBUMIN 3.7 11/03/2021 1345   AST 19 11/03/2021 1345   ALT 20 11/03/2021 1345   ALKPHOS 74 11/03/2021 1345   BILITOT 1.2 11/03/2021 1345   GFRNONAA 9 (L) 11/03/2021 1345   GFRNONAA >60 11/18/2013 0138   GFRAA 18 (L) 09/15/2020 0403   GFRAA >60 11/18/2013 0138     VAS [place] ABI WITH/WO TBI  Result Date: 11/03/2021  [place] STUDY Patient Name:  BRANDELYN HENNE  Date of Exam:   11/03/2021 Medical Rec #: [id number]        Accession #:    [id number] Date of Birth: January 02, 1961         Patient Gender: F Patient Age:   62 years Exam Location:  [facility] Procedure:      VAS [place] ABI WITH/WO TBI Referring Phys: --------------------------------------------------------------------------------  Indications: Rest pain.  Performing Technologist: Concha Norway RVT  Examination Guidelines: A complete evaluation includes at minimum, Doppler waveform signals and systolic blood pressure reading at the level of bilateral brachial, anterior tibial, and posterior tibial arteries, when vessel segments are accessible. Bilateral testing is considered an integral part of a complete examination. Photoelectric Plethysmograph (PPG) waveforms and toe systolic pressure readings are  included as required and additional duplex testing as needed. Limited examinations for reoccurring indications may be performed as noted.  ABI Findings: +---------+------------------+-----+--------+--------+  Right     Rt Pressure (mmHg) Index Waveform Comment   +---------+------------------+-----+--------+--------+  Brachial                                    HDA       +---------+------------------+-----+--------+--------+  ATA       168                      biphasic 1.11      +---------+------------------+-----+--------+--------+  PTA       158                1.05  biphasic           +---------+------------------+-----+--------+--------+  Great Toe 131                0.87  Normal             +---------+------------------+-----+--------+--------+ +---------+------------------+-----+--------+-------+  Left      Lt Pressure (mmHg) Index Waveform Comment  +---------+------------------+-----+--------+-------+  Brachial  151                                        +---------+------------------+-----+--------+-------+  ATA       162  biphasic 1.07     +---------+------------------+-----+--------+-------+  PTA       151                1.00  biphasic          +---------+------------------+-----+--------+-------+  Great Toe 125                0.83  Normal            +---------+------------------+-----+--------+-------+  Summary: Right: Resting right ankle-brachial index is within normal range. No evidence of significant right lower extremity arterial disease. The right toe-brachial index is normal. Left: Resting left ankle-brachial index is within normal range. No evidence of significant left lower extremity arterial disease. The left toe-brachial index is normal.  *See table(s) above for measurements and observations.  Electronically signed by [name] MD on 11/03/2021 at 4:51:54 PM.    Final        Assessment & Plan:   1. ESRD (end stage renal disease) ([place]) Recommend:  The patient is  experiencing increasing problems with their dialysis access.  Patient should have a fistulagram with the intention for intervention.  The intention for intervention is to restore appropriate flow and prevent thrombosis and possible loss of the access.  As well as improve the quality of dialysis therapy.  The risks, benefits and alternative therapies were reviewed in detail with the patient.  All questions were answered.  The patient agrees to proceed with angio/intervention.      2. Hyperlipidemia associated with type 2 diabetes mellitus ([place]) Continue statin as ordered and reviewed, no changes at this time   3. Hypertension associated with diabetes ([place]) Continue antihypertensive medications as already ordered, these medications have been reviewed and there are no changes at this time.    Current Outpatient Medications on File Prior to Visit  Medication Sig Dispense Refill   acetaminophen (TYLENOL) 500 MG tablet Take 500-1,000 mg by mouth every 6 (six) hours as needed for mild pain or moderate pain.     albuterol (VENTOLIN HFA) 108 (90 Base) MCG/ACT inhaler Inhale 2 puffs into the lungs every 6 (six) hours as needed for wheezing or shortness of breath.     amLODipine (NORVASC) 10 MG tablet Take 1 tablet (10 mg total) by mouth daily. 30 tablet 0   ammonium lactate (LAC-HYDRIN) 12 % lotion Apply 1 application topically daily.     aspirin 81 MG EC tablet Take 1 tablet (81 mg total) by mouth daily. 30 tablet 0   atorvastatin (LIPITOR) 80 MG tablet Take 1 tablet (80 mg total) by mouth daily at 6 PM. 30 tablet 0   bismuth subsalicylate (PEPTO BISMOL) 262 MG/15ML suspension Take 30 mLs by mouth every 6 (six) hours as needed for indigestion or diarrhea or loose stools.     Blood Glucose Monitoring Suppl (TRUE METRIX METER) w/Device KIT      carvedilol (COREG) 25 MG tablet Take 25 mg by mouth 2 (two) times daily.     cloNIDine (CATAPRES) 0.1 MG tablet Take 0.1 mg by mouth 2 (two) times daily.  (Non-dialysis days)     furosemide (LASIX) 40 MG tablet Take 40 mg by mouth daily.     gabapentin (NEURONTIN) 100 MG capsule Take by mouth.     insulin aspart protamine- aspart (NOVOLOG MIX 70/30) (70-30) 100 UNIT/ML injection Inject 0.15 mLs (15 Units total) into the skin 2 (two) times daily with a meal. 10 mL 0   isosorbide mononitrate (IMDUR) 30 MG 24 hr  tablet Take 30 mg by mouth daily.     loperamide (IMODIUM A-D) 2 MG tablet Take 4 mg by mouth 4 (four) times daily as needed for diarrhea or loose stools.     mirtazapine (REMERON) 30 MG tablet Take 1 tablet (30 mg total) by mouth at bedtime. 30 tablet 0   mometasone-formoterol (DULERA) 200-5 MCG/ACT AERO Inhale 2 puffs into the lungs 2 (two) times daily.     omeprazole (PRILOSEC) 40 MG capsule Take 40 mg by mouth daily.     ondansetron (ZOFRAN ODT) 4 MG disintegrating tablet Take 1 tablet (4 mg total) by mouth every 8 (eight) hours as needed. 20 tablet 0   polyethylene glycol (MIRALAX / GLYCOLAX) 17 g packet Take 17 g by mouth daily. 14 each 0   topiramate (TOPAMAX) 25 MG tablet Take 25 mg by mouth daily.     traZODone (DESYREL) 50 MG tablet Take 50 mg by mouth at bedtime.     TRUE METRIX BLOOD GLUCOSE TEST test strip      vitamin B-12 (CYANOCOBALAMIN) 1000 MCG tablet Take 1,000 mcg by mouth daily.     No current facility-administered medications on file prior to visit.    There are no Patient Instructions on file for this visit. No follow-ups on file.   Kris Hartmann, NP

## 2021-12-14 DIAGNOSIS — Z992 Dependence on renal dialysis: Secondary | ICD-10-CM | POA: Diagnosis not present

## 2021-12-14 DIAGNOSIS — N2581 Secondary hyperparathyroidism of renal origin: Secondary | ICD-10-CM | POA: Diagnosis not present

## 2021-12-14 DIAGNOSIS — N186 End stage renal disease: Secondary | ICD-10-CM | POA: Diagnosis not present

## 2021-12-16 ENCOUNTER — Telehealth (INDEPENDENT_AMBULATORY_CARE_PROVIDER_SITE_OTHER): Payer: Self-pay

## 2021-12-16 ENCOUNTER — Other Ambulatory Visit (HOSPITAL_COMMUNITY): Payer: Self-pay

## 2021-12-16 DIAGNOSIS — Z992 Dependence on renal dialysis: Secondary | ICD-10-CM | POA: Diagnosis not present

## 2021-12-16 DIAGNOSIS — N186 End stage renal disease: Secondary | ICD-10-CM | POA: Diagnosis not present

## 2021-12-16 DIAGNOSIS — N2581 Secondary hyperparathyroidism of renal origin: Secondary | ICD-10-CM | POA: Diagnosis not present

## 2021-12-16 NOTE — Telephone Encounter (Signed)
Spoke with the patient and she is scheduled with Dr. Delana Meyer for a right arm fistulagram on 12/29/21 with a 6:45 am arrival time to the MM. Pre-procedure instructions were discussed and will be mailed.

## 2021-12-16 NOTE — Progress Notes (Signed)
Contacted Terry Arroyo today to check on her.  She was very depressed last week when dropped off Christmas cheer to her.  She is having 2 procedures coming up in January.  She found out the likely hood of stopping dialysis is not good, she will have to do rest of her life.  She was in a good mood today, she had a good Christmas she said at her Dads.  We talked about the up coming procedures and she sounds a lot better about going through with them.  Asked if she needed anything, like Moms meals to help her out, she states no she is good. She states Im just going to keep going and enjoy life as much as she can. She just got in from dialysis and cooking her something.  Talked for a few minutes and will plan to visit after her procedures next month.  Advised her if there is anything I can help her with to call me.  She states she will.  Will continue to visit for heart failure, diet and medication management.   [place] [phone]

## 2021-12-18 DIAGNOSIS — Z992 Dependence on renal dialysis: Secondary | ICD-10-CM | POA: Diagnosis not present

## 2021-12-18 DIAGNOSIS — N186 End stage renal disease: Secondary | ICD-10-CM | POA: Diagnosis not present

## 2021-12-18 DIAGNOSIS — N2581 Secondary hyperparathyroidism of renal origin: Secondary | ICD-10-CM | POA: Diagnosis not present

## 2021-12-19 DIAGNOSIS — Z992 Dependence on renal dialysis: Secondary | ICD-10-CM | POA: Diagnosis not present

## 2021-12-19 DIAGNOSIS — N186 End stage renal disease: Secondary | ICD-10-CM | POA: Diagnosis not present

## 2021-12-21 DIAGNOSIS — Z992 Dependence on renal dialysis: Secondary | ICD-10-CM | POA: Diagnosis not present

## 2021-12-21 DIAGNOSIS — N186 End stage renal disease: Secondary | ICD-10-CM | POA: Diagnosis not present

## 2021-12-21 DIAGNOSIS — N2581 Secondary hyperparathyroidism of renal origin: Secondary | ICD-10-CM | POA: Diagnosis not present

## 2021-12-22 ENCOUNTER — Ambulatory Visit: Payer: Medicare HMO | Admitting: Anesthesiology

## 2021-12-22 ENCOUNTER — Ambulatory Visit
Admission: RE | Admit: 2021-12-22 | Discharge: 2021-12-22 | Disposition: A | Discharge status: Home / Self Care | Payer: Medicare HMO | Attending: Gastroenterology | Admitting: Gastroenterology

## 2021-12-22 ENCOUNTER — Encounter
Admission: RE | Disposition: A | Discharge status: Home / Self Care | Payer: Self-pay | Source: Home / Self Care | Attending: Gastroenterology

## 2021-12-22 ENCOUNTER — Encounter: Payer: Self-pay | Admitting: *Deleted

## 2021-12-22 ENCOUNTER — Ambulatory Visit: Payer: Medicare HMO | Admitting: Family

## 2021-12-22 DIAGNOSIS — F32A Depression, unspecified: Secondary | ICD-10-CM | POA: Diagnosis not present

## 2021-12-22 DIAGNOSIS — I2510 Atherosclerotic heart disease of native coronary artery without angina pectoris: Secondary | ICD-10-CM | POA: Insufficient documentation

## 2021-12-22 DIAGNOSIS — Z951 Presence of aortocoronary bypass graft: Secondary | ICD-10-CM | POA: Diagnosis not present

## 2021-12-22 DIAGNOSIS — N186 End stage renal disease: Secondary | ICD-10-CM | POA: Insufficient documentation

## 2021-12-22 DIAGNOSIS — R109 Unspecified abdominal pain: Secondary | ICD-10-CM | POA: Diagnosis present

## 2021-12-22 DIAGNOSIS — Z8673 Personal history of transient ischemic attack (TIA), and cerebral infarction without residual deficits: Secondary | ICD-10-CM | POA: Insufficient documentation

## 2021-12-22 DIAGNOSIS — I252 Old myocardial infarction: Secondary | ICD-10-CM | POA: Insufficient documentation

## 2021-12-22 DIAGNOSIS — J449 Chronic obstructive pulmonary disease, unspecified: Secondary | ICD-10-CM | POA: Diagnosis not present

## 2021-12-22 DIAGNOSIS — N2581 Secondary hyperparathyroidism of renal origin: Secondary | ICD-10-CM | POA: Insufficient documentation

## 2021-12-22 DIAGNOSIS — Z8616 Personal history of COVID-19: Secondary | ICD-10-CM | POA: Diagnosis not present

## 2021-12-22 DIAGNOSIS — K2900 Acute gastritis without bleeding: Secondary | ICD-10-CM | POA: Diagnosis not present

## 2021-12-22 DIAGNOSIS — Z992 Dependence on renal dialysis: Secondary | ICD-10-CM | POA: Diagnosis not present

## 2021-12-22 DIAGNOSIS — E1151 Type 2 diabetes mellitus with diabetic peripheral angiopathy without gangrene: Secondary | ICD-10-CM | POA: Insufficient documentation

## 2021-12-22 DIAGNOSIS — Z86718 Personal history of other venous thrombosis and embolism: Secondary | ICD-10-CM | POA: Diagnosis not present

## 2021-12-22 DIAGNOSIS — K2980 Duodenitis without bleeding: Secondary | ICD-10-CM | POA: Diagnosis not present

## 2021-12-22 DIAGNOSIS — F419 Anxiety disorder, unspecified: Secondary | ICD-10-CM | POA: Diagnosis not present

## 2021-12-22 DIAGNOSIS — I5022 Chronic systolic (congestive) heart failure: Secondary | ICD-10-CM | POA: Insufficient documentation

## 2021-12-22 DIAGNOSIS — G4730 Sleep apnea, unspecified: Secondary | ICD-10-CM | POA: Diagnosis not present

## 2021-12-22 DIAGNOSIS — Z87891 Personal history of nicotine dependence: Secondary | ICD-10-CM | POA: Diagnosis not present

## 2021-12-22 DIAGNOSIS — K3189 Other diseases of stomach and duodenum: Secondary | ICD-10-CM | POA: Insufficient documentation

## 2021-12-22 DIAGNOSIS — E1122 Type 2 diabetes mellitus with diabetic chronic kidney disease: Secondary | ICD-10-CM | POA: Insufficient documentation

## 2021-12-22 DIAGNOSIS — E785 Hyperlipidemia, unspecified: Secondary | ICD-10-CM | POA: Diagnosis not present

## 2021-12-22 DIAGNOSIS — K219 Gastro-esophageal reflux disease without esophagitis: Secondary | ICD-10-CM | POA: Insufficient documentation

## 2021-12-22 DIAGNOSIS — K2970 Gastritis, unspecified, without bleeding: Secondary | ICD-10-CM | POA: Diagnosis not present

## 2021-12-22 DIAGNOSIS — K2990 Gastroduodenitis, unspecified, without bleeding: Secondary | ICD-10-CM | POA: Diagnosis not present

## 2021-12-22 DIAGNOSIS — I132 Hypertensive heart and chronic kidney disease with heart failure and with stage 5 chronic kidney disease, or end stage renal disease: Secondary | ICD-10-CM | POA: Insufficient documentation

## 2021-12-22 HISTORY — DX: Vitreous degeneration, right eye: H43.811

## 2021-12-22 HISTORY — PX: ESOPHAGOGASTRODUODENOSCOPY (EGD) WITH PROPOFOL: SHX5813

## 2021-12-22 HISTORY — DX: Herpesviral infection of urogenital system, unspecified: A60.00

## 2021-12-22 LAB — GLUCOSE, CAPILLARY: Glucose-Capillary: 173 mg/dL — ABNORMAL HIGH (ref 70–99)

## 2021-12-22 LAB — POTASSIUM: Potassium: 4.2 mmol/L (ref 3.5–5.1)

## 2021-12-22 SURGERY — ESOPHAGOGASTRODUODENOSCOPY (EGD) WITH PROPOFOL
Anesthesia: General

## 2021-12-22 MED ORDER — LIDOCAINE HCL (CARDIAC) PF 100 MG/5ML IV SOSY
PREFILLED_SYRINGE | INTRAVENOUS | Status: DC | PRN
  Administered 2021-12-22: 40 mg via INTRAVENOUS

## 2021-12-22 MED ORDER — PROPOFOL 10 MG/ML IV BOLUS
INTRAVENOUS | Status: DC | PRN
  Administered 2021-12-22: 40 mg via INTRAVENOUS

## 2021-12-22 MED ORDER — LIDOCAINE HCL (PF) 2 % IJ SOLN
INTRAMUSCULAR | Status: AC
  Filled 2021-12-22: qty 5

## 2021-12-22 MED ORDER — PROPOFOL 500 MG/50ML IV EMUL
INTRAVENOUS | Status: AC
  Filled 2021-12-22: qty 50

## 2021-12-22 MED ORDER — PROPOFOL 500 MG/50ML IV EMUL
INTRAVENOUS | Status: DC | PRN
  Administered 2021-12-22: 150 ug/kg/min via INTRAVENOUS

## 2021-12-22 MED ORDER — SODIUM CHLORIDE 0.9 % IV SOLN: INTRAVENOUS | Status: DC

## 2021-12-22 NOTE — Op Note (Signed)
[facility] Patient Name: Terry Arroyo Procedure Date: 12/22/2021 7:46 AM MRN: [id number] Account #: [id number] Date of Birth: 01/29/1961 Admit Type: Outpatient Age: 61 Room: [facility] ENDO ROOM 1 Gender: Female Note Status: Finalized Instrument Name: Upper Endoscope [id number] Procedure:             Upper GI endoscopy Indications:           Generalized abdominal pain Providers:             Andrey Farmer MD, MD Medicines:             Monitored Anesthesia Care Complications:         No immediate complications. Estimated blood loss:                         Minimal. Procedure:             Pre-Anesthesia Assessment:                        - Prior to the procedure, a History and Physical was                         performed, and patient medications and allergies were                         reviewed. The patient is competent. The risks and                         benefits of the procedure and the sedation options and                         risks were discussed with the patient. All questions                         were answered and informed consent was obtained.                         Patient identification and proposed procedure were                         verified by the physician, the nurse, the                         anesthesiologist, the anesthetist and the technician                         in the endoscopy suite. Mental Status Examination:                         alert and oriented. Airway Examination: normal                         oropharyngeal airway and neck mobility. Respiratory                         Examination: clear to auscultation. CV Examination:                         normal. Prophylactic Antibiotics: The patient does not  require prophylactic antibiotics. Prior                         Anticoagulants: The patient has taken no previous                         anticoagulant or antiplatelet agents. ASA Grade                          Assessment: III - A patient with severe systemic                         disease. After reviewing the risks and benefits, the                         patient was deemed in satisfactory condition to                         undergo the procedure. The anesthesia plan was to use                         monitored anesthesia care (MAC). Immediately prior to                         administration of medications, the patient was                         re-assessed for adequacy to receive sedatives. The                         heart rate, respiratory rate, oxygen saturations,                         blood pressure, adequacy of pulmonary ventilation, and                         response to care were monitored throughout the                         procedure. The physical status of the patient was                         re-assessed after the procedure.                        After obtaining informed consent, the endoscope was                         passed under direct vision. Throughout the procedure,                         the patient's blood pressure, pulse, and oxygen                         saturations were monitored continuously. The Endoscope                         was introduced through the mouth, and advanced to the  second part of duodenum. The upper GI endoscopy was                         accomplished without difficulty. The patient tolerated                         the procedure well. Findings:      The examined esophagus was normal.      Patchy mild inflammation characterized by erythema was found in the       gastric antrum. Biopsies were taken with a cold forceps for Helicobacter       pylori testing. Estimated blood loss was minimal.      Localized mildly congested mucosa without active bleeding and with no       stigmata of bleeding was found in the second portion of the duodenum.       Biopsies were taken with a cold forceps for  histology. Estimated blood       loss was minimal.      The exam of the duodenum was otherwise normal. Impression:            - Normal esophagus.                        - Gastritis. Biopsied.                        - Congested duodenal mucosa. Biopsied. Recommendation:        - Discharge patient to home.                        - Resume previous diet.                        - Continue present medications.                        - Await pathology results.                        - Return to referring physician as previously                         scheduled. Procedure Code(s):     --- Professional ---                        [phone], Esophagogastroduodenoscopy, flexible,                         transoral; with biopsy, single or multiple Diagnosis Code(s):     --- Professional ---                        K29.70, Gastritis, unspecified, without bleeding                        K31.89, Other diseases of stomach and duodenum                        R10.84, Generalized abdominal pain CPT copyright 2019 American Medical Association. All rights reserved. The codes documented in this report are preliminary and upon coder review may  be revised to meet current  compliance requirements. Andrey Farmer MD, MD 12/22/2021 9:11:37 AM Number of Addenda: 0 Note Initiated On: 12/22/2021 7:46 AM Estimated Blood Loss:  Estimated blood loss was minimal.      [facility]

## 2021-12-22 NOTE — Anesthesia Preprocedure Evaluation (Signed)
Anesthesia Evaluation  Patient identified by MRN, date of birth, ID band Patient awake    Reviewed: Allergy & Precautions, NPO status , Patient's Chart, lab work & pertinent test results  History of Anesthesia Complications Negative for: history of anesthetic complications  Airway Mallampati: III  TM Distance: >3 FB Neck ROM: full    Dental  (+) Missing   Pulmonary asthma , sleep apnea , COPD,  COPD inhaler, former smoker,    Pulmonary exam normal        Cardiovascular Exercise Tolerance: Poor hypertension, (-) angina+ CAD, + Past MI, + Cardiac Stents, + CABG, + Peripheral Vascular Disease, +CHF and + DOE  Normal cardiovascular exam     Neuro/Psych  Headaches, PSYCHIATRIC DISORDERS  Neuromuscular disease CVA    GI/Hepatic Neg liver ROS, GERD  Medicated and Controlled,  Endo/Other  diabetes, Type 2, Insulin Dependent  Renal/GU DialysisRenal disease  negative genitourinary   Musculoskeletal   Abdominal   Peds  Hematology negative hematology ROS (+)   Anesthesia Other Findings Past Medical History: No date: Anemia No date: Angina at rest [facility]) 03/20/2021: Angioedema     Comment:  Lisinopril No date: Anterior cerebral aneurysm     Comment:  approximately 5 mm; left MCA  No date: Anxiety No date: Aortic atherosclerosis (HCC) No date: Asthma No date: Blind left eye No date: COPD (chronic obstructive pulmonary disease) (HCC) No date: Coronary artery disease No date: Depression No date: DOE (dyspnea on exertion) No date: ESRD (end stage renal disease) ([place]) No date: Gait instability No date: Genital herpes No date: GERD (gastroesophageal reflux disease) No date: HFrEF (heart failure with reduced ejection fraction) ([place]) 03/2019: History of 2019 novel coronavirus disease (COVID-19)     Comment:  [place] No date: History of DVT (deep vein thrombosis) No date: History of DVT (deep vein  thrombosis) 2018: Hx of CABG     Comment:  x 4; performed in [place] No date: Hyperlipidemia No date: Hypertension 2018: MI (myocardial infarction) ([place])     Comment:  no stents No date: Pituitary adenoma ([place]) No date: PVD (posterior vitreous detachment), right eye No date: Secondary hyperparathyroidism ([place]) No date: Sleep apnea     Comment:  non-compliant with nocturnal PAP therapy 2014: Stroke [facility])     Comment:  stated affected left side. No date: T2DM (type 2 diabetes mellitus) ([place])  Past Surgical History: 01/28/2021: AV FISTULA PLACEMENT; Right     Comment:  Procedure: ARTERIOVENOUS (AV) FISTULA CREATION;                Surgeon: Katha Cabal, MD;  Location: [facility] ORS;                Service: Vascular;  Laterality: Right; 05/13/2021: AV FISTULA PLACEMENT; Right     Comment:  Procedure: INSERTION OF ARTERIOVENOUS (AV) GORE-TEX               GRAFT ARM ( BRACHIAL AXILLARY );  Surgeon: Katha Cabal, MD;  Location: [facility] ORS;  Service: Vascular;                Laterality: Right; 1986: BRAIN SURGERY     Comment:  pituitary tumor yrs ago: BREAST EXCISIONAL BIOPSY; Left     Comment:  benign No date: CESAREAN SECTION 2018: CORONARY ARTERY BYPASS GRAFT     Comment:  x 4 vessels; performed in  [place] 11/07/2020: DIALYSIS/PERMA CATHETER INSERTION; N/A     Comment:  Procedure: DIALYSIS/PERMA CATHETER INSERTION;  Surgeon:               Algernon Huxley, MD;  Location: [place] [facility] LAB;                Service: Cardiovascular;  Laterality: N/A; 06/12/2021: DIALYSIS/PERMA CATHETER REMOVAL; N/A     Comment:  Procedure: DIALYSIS/PERMA CATHETER REMOVAL;  Surgeon:               Algernon Huxley, MD;  Location: [place] CV LAB;                Service: Cardiovascular;  Laterality: N/A; 2021: EYE SURGERY; Right     Comment:  cataracts  04/03/2021: PERIPHERAL VASCULAR THROMBECTOMY; Right     Comment:  Procedure: A/V Fistulagram ;  Surgeon: Katha Cabal, MD;  Location: [place] [facility];  Service:               Cardiovascular;  Laterality: Right;  BMI    Body Mass Index: 27.76 kg/m      Reproductive/Obstetrics negative OB ROS                             Anesthesia Physical Anesthesia Plan  ASA: 4  Anesthesia Plan: General   Post-op Pain Management:    Induction: Intravenous  PONV Risk Score and Plan: Propofol infusion and TIVA  Airway Management Planned: Natural Airway and Nasal Cannula  Additional Equipment:   Intra-op Plan:   Post-operative Plan:   Informed Consent: I have reviewed the patients History and Physical, chart, labs and discussed the procedure including the risks, benefits and alternatives for the proposed anesthesia with the patient or authorized representative who has indicated his/her understanding and acceptance.     Dental Advisory Given  Plan Discussed with: Anesthesiologist, CRNA and Surgeon  Anesthesia Plan Comments: (Patient consented for risks of anesthesia including but not limited to:  - adverse reactions to medications - risk of airway placement if required - damage to eyes, teeth, lips or other oral mucosa - nerve damage due to positioning  - sore throat or hoarseness - Damage to heart, brain, nerves, lungs, other parts of body or loss of life  Patient voiced understanding.)        Anesthesia Quick Evaluation

## 2021-12-22 NOTE — Anesthesia Procedure Notes (Signed)
Procedure Name: MAC Date/Time: 12/22/2021 9:01 AM Performed by: Johnna Acosta, CRNA Pre-anesthesia Checklist: Patient identified, Emergency Drugs available, Suction available and Patient being monitored Patient Re-evaluated:Patient Re-evaluated prior to induction Oxygen Delivery Method: Nasal cannula

## 2021-12-22 NOTE — Interval H&P Note (Signed)
History and Physical Interval Note:  12/22/2021 7:47 AM  Terry Arroyo  has presented today for surgery, with the diagnosis of Epigastric Pain (R10.13).  The various methods of treatment have been discussed with the patient and family. After consideration of risks, benefits and other options for treatment, the patient has consented to  Procedure(s) with comments: ESOPHAGOGASTRODUODENOSCOPY (EGD) WITH PROPOFOL (N/A) - IDDM as a surgical intervention.  The patient's history has been reviewed, patient examined, no change in status, stable for surgery.  I have reviewed the patient's chart and labs.  Questions were answered to the patient's satisfaction.     Lesly Rubenstein  Ok to proceed with EGD

## 2021-12-22 NOTE — Transfer of Care (Signed)
Immediate Anesthesia Transfer of Care Note  Patient: Terry Arroyo  Procedure(s) Performed: ESOPHAGOGASTRODUODENOSCOPY (EGD) WITH PROPOFOL  Patient Location: PACU  Anesthesia Type:General  Level of Consciousness: sedated  Airway & Oxygen Therapy: Patient Spontanous Breathing  Post-op Assessment: Report given to RN and Post -op Vital signs reviewed and stable  Post vital signs: Reviewed and stable  Last Vitals:  Vitals Value Taken Time  BP 111/56 12/22/21 0911  Temp    Pulse 57 12/22/21 0911  Resp 18 12/22/21 0911  SpO2 93 % 12/22/21 0911  Vitals shown include unvalidated device data.  Last Pain: There were no vitals filed for this visit.       Complications: No notable events documented.

## 2021-12-22 NOTE — H&P (Signed)
Outpatient short stay form Pre-procedure 12/22/2021  Lesly Rubenstein, MD  Primary Physician: Kirk Ruths, MD  Reason for visit:  Abdominal Pain  History of present illness:   61 y/o lady with history of ESRD on dialysis here for EGD for chronic abdominal pain. No blood thinners. No neck surgeries. No family history of GI malignancies.    Current Facility-Administered Medications:    0.9 %  sodium chloride infusion, , Intravenous, Continuous, Cordarro Spinnato, Hilton Cork, MD, Last Rate: 20 mL/hr at 12/22/21 0731, Continued from Pre-op at 12/22/21 0731  Medications Prior to Admission  Medication Sig Dispense Refill Last Dose   albuterol (VENTOLIN HFA) 108 (90 Base) MCG/ACT inhaler Inhale 2 puffs into the lungs every 6 (six) hours as needed for wheezing or shortness of breath.   Past Week   amLODipine (NORVASC) 10 MG tablet Take 1 tablet (10 mg total) by mouth daily. 30 tablet 0 12/22/2021   aspirin 81 MG EC tablet Take 1 tablet (81 mg total) by mouth daily. 30 tablet 0 12/21/2021   carvedilol (COREG) 25 MG tablet Take 25 mg by mouth 2 (two) times daily.   12/22/2021   furosemide (LASIX) 80 MG tablet Take 80 mg by mouth 4 (four) times a week. Tues, Thurs, Sat, and Sun (non-dialysis days)   12/21/2021   insulin aspart protamine- aspart (NOVOLOG MIX 70/30) (70-30) 100 UNIT/ML injection Inject 0.15 mLs (15 Units total) into the skin 2 (two) times daily with a meal. (Patient taking differently: Inject 15 Units into the skin 3 (three) times daily with meals.) 10 mL 0 12/21/2021   isosorbide mononitrate (IMDUR) 30 MG 24 hr tablet Take 30 mg by mouth daily.   12/21/2021   omeprazole (PRILOSEC) 40 MG capsule Take 40 mg by mouth daily.   12/21/2021   acetaminophen (TYLENOL) 500 MG tablet Take 1,000 mg by mouth every 6 (six) hours as needed for mild pain or moderate pain.      ammonium lactate (LAC-HYDRIN) 12 % lotion Apply 1 application topically every Monday, Wednesday, and Friday.      atorvastatin (LIPITOR) 80  MG tablet Take 1 tablet (80 mg total) by mouth daily at 6 PM. (Patient taking differently: Take 80 mg by mouth daily.) 30 tablet 0    Blood Glucose Monitoring Suppl (TRUE METRIX METER) w/Device KIT       gabapentin (NEURONTIN) 100 MG capsule Take 100 mg by mouth daily as needed (pain).      lidocaine-prilocaine (EMLA) cream Apply 1 application topically as needed (fistula access).      mirtazapine (REMERON) 30 MG tablet Take 1 tablet (30 mg total) by mouth at bedtime. (Patient not taking: Reported on 12/18/2021) 30 tablet 0    multivitamin (RENA-VIT) TABS tablet Take 1 tablet by mouth every Monday, Wednesday, and Friday.      ondansetron (ZOFRAN ODT) 4 MG disintegrating tablet Take 1 tablet (4 mg total) by mouth every 8 (eight) hours as needed. 20 tablet 0    polyethylene glycol (MIRALAX / GLYCOLAX) 17 g packet Take 17 g by mouth daily. (Patient taking differently: Take 17 g by mouth daily as needed for moderate constipation.) 14 each 0    sucralfate (CARAFATE) 1 g tablet Take 1 g by mouth 2 (two) times daily before a meal.      topiramate (TOPAMAX) 25 MG tablet Take 25 mg by mouth daily.      traZODone (DESYREL) 50 MG tablet Take 50 mg by mouth at bedtime.  TRUE METRIX BLOOD GLUCOSE TEST test strip       vitamin B-12 (CYANOCOBALAMIN) 1000 MCG tablet Take 1,000 mcg by mouth daily.        Allergies  Allergen Reactions   Hydralazine Hives, Shortness Of Breath, Swelling and Rash    Body aches    Kiwi Extract Hives, Swelling and Rash    Mouth swells    Lisinopril Swelling    Angioedema   Shellfish Allergy Anaphylaxis    Shrimp/lobster  Betadine leaves welts on skin     Betadine [Povidone Iodine] Hives and Rash    LEAVES WELTS ON SKIN   Metformin Diarrhea and Other (See Comments)    GI Intolerance   Tape Itching    Use paper tape whenever possible     Past Medical History:  Diagnosis Date   Anemia    Angina at rest [facility])    Angioedema 03/20/2021   Lisinopril   Anterior  cerebral aneurysm    approximately 5 mm; left MCA    Anxiety    Aortic atherosclerosis (HCC)    Asthma    Blind left eye    COPD (chronic obstructive pulmonary disease) (HCC)    Coronary artery disease    Depression    DOE (dyspnea on exertion)    ESRD (end stage renal disease) (HCC)    Gait instability    Genital herpes    GERD (gastroesophageal reflux disease)    HFrEF (heart failure with reduced ejection fraction) ([place])    History of 2019 novel coronavirus disease (COVID-19) 03/2019   [place]   History of DVT (deep vein thrombosis)    History of DVT (deep vein thrombosis)    Hx of CABG 2018   x 4; performed in [place]   Hyperlipidemia    Hypertension    MI (myocardial infarction) ([place]) 2018   no stents   Pituitary adenoma ([place])    PVD (posterior vitreous detachment), right eye    Secondary hyperparathyroidism ([place])    Sleep apnea    non-compliant with nocturnal PAP therapy   Stroke [facility]) 2014   stated affected left side.   T2DM (type 2 diabetes mellitus) ([place])     Review of systems:  Otherwise negative.    Physical Exam  Gen: Alert, oriented. Appears stated age.  HEENT: PERRLA. Lungs: No respiratory distress CV: RRR Abd: soft, benign, no masses Ext: No edema    Planned procedures: Proceed with EGD. The patient understands the nature of the planned procedure, indications, risks, alternatives and potential complications including but not limited to bleeding, infection, perforation, damage to internal organs and possible oversedation/side effects from anesthesia. The patient agrees and gives consent to proceed.  Please refer to procedure notes for findings, recommendations and patient disposition/instructions.     Lesly Rubenstein, MD [facility]

## 2021-12-22 NOTE — Anesthesia Postprocedure Evaluation (Signed)
Anesthesia Post Note  Patient: MARIGENE ERLER  Procedure(s) Performed: ESOPHAGOGASTRODUODENOSCOPY (EGD) WITH PROPOFOL  Patient location during evaluation: Endoscopy Anesthesia Type: General Level of consciousness: awake and alert Pain management: pain level controlled Vital Signs Assessment: post-procedure vital signs reviewed and stable Respiratory status: spontaneous breathing, nonlabored ventilation, respiratory function stable and patient connected to nasal cannula oxygen Cardiovascular status: blood pressure returned to baseline and stable Postop Assessment: no apparent nausea or vomiting Anesthetic complications: no   No notable events documented.   Last Vitals:  Vitals:   12/22/21 0911 12/22/21 0941  BP: (!) 111/56 130/66  Pulse:    Resp:    Temp:    SpO2:      Last Pain:  Vitals:   12/22/21 0941  TempSrc:   PainSc: 0-No pain                 Precious Haws Ailin [place]

## 2021-12-23 ENCOUNTER — Encounter: Payer: Self-pay | Admitting: Gastroenterology

## 2021-12-23 DIAGNOSIS — N2581 Secondary hyperparathyroidism of renal origin: Secondary | ICD-10-CM | POA: Diagnosis not present

## 2021-12-23 DIAGNOSIS — N186 End stage renal disease: Secondary | ICD-10-CM | POA: Diagnosis not present

## 2021-12-23 DIAGNOSIS — Z992 Dependence on renal dialysis: Secondary | ICD-10-CM | POA: Diagnosis not present

## 2021-12-23 LAB — SURGICAL PATHOLOGY

## 2021-12-23 NOTE — Progress Notes (Signed)
Terry Arroyo endorses being able to eat yesterday after procedure but became nauseated and vomited two times last night. She endorses abdominal pain that feels like gas cramps. She states intent of taking nausea medication that is prescribed to her this morning. This RN instructed patient to notify Dr. Haig Prophet of persistent nausea/vomiting or worsening abdominal pain.

## 2021-12-25 DIAGNOSIS — Z992 Dependence on renal dialysis: Secondary | ICD-10-CM | POA: Diagnosis not present

## 2021-12-25 DIAGNOSIS — N186 End stage renal disease: Secondary | ICD-10-CM | POA: Diagnosis not present

## 2021-12-25 DIAGNOSIS — N2581 Secondary hyperparathyroidism of renal origin: Secondary | ICD-10-CM | POA: Diagnosis not present

## 2021-12-27 NOTE — Progress Notes (Deleted)
Patient ID: Terry Arroyo, female    DOB: [birth year], 61 y.o.   MRN: [id number]  HPI  Terry Arroyo is a 61 y/o female with a history of DM, hyperlipidemia, HTN, brain tumor, left eye blindness, previous tobacco use and chronic heart failure.   Echo report from 06/13/21 reviewed and showed an EF of 35-40% along with mild MR and mild/moderate TR. Echo report from 02/27/20 reviewed and showed an EF of 30-35% along with mild MR.   Was in the ED 11/04/21 due to   She presents today for a follow-up visit although hasn't been seen since April 2021. She presents with a chief complaint of moderate fatigue with little exertion. She describes this as chronic in nature having been present for several years. She has associated cough, shortness of breath, intermittent chest pain, pedal edema, headaches, light-headedness, weakness, chronic back pain and n/v along with this. She denies any difficulty sleeping, abdominal distention, palpitations or weight gain.   Currently receiving dialysis on M, W, F and says that she's doing ok with that. Has recently started using compression boots that she bought off of [employer] and she says that helps with the swelling in her legs.   Past Medical History:  Diagnosis Date   Anemia    Angina at rest [facility])    Angioedema 03/20/2021   Lisinopril   Anterior cerebral aneurysm    approximately 5 mm; left MCA    Anxiety    Aortic atherosclerosis (HCC)    Asthma    Blind left eye    COPD (chronic obstructive pulmonary disease) (HCC)    Coronary artery disease    Depression    DOE (dyspnea on exertion)    ESRD (end stage renal disease) (HCC)    Gait instability    Genital herpes    GERD (gastroesophageal reflux disease)    HFrEF (heart failure with reduced ejection fraction) ([place])    History of 2019 novel coronavirus disease (COVID-19) 03/2019   [place]   History of DVT (deep vein thrombosis)    History of DVT (deep vein thrombosis)    Hx of  CABG 2018   x 4; performed in [place]   Hyperlipidemia    Hypertension    MI (myocardial infarction) ([place]) 2018   no stents   Pituitary adenoma ([place])    PVD (posterior vitreous detachment), right eye    Secondary hyperparathyroidism ([place])    Sleep apnea    non-compliant with nocturnal PAP therapy   Stroke [facility]) 2014   stated affected left side.   T2DM (type 2 diabetes mellitus) ([place])    Past Surgical History:  Procedure Laterality Date   AV FISTULA PLACEMENT Right 01/28/2021   Procedure: ARTERIOVENOUS (AV) FISTULA CREATION;  Surgeon: Katha Cabal, MD;  Location: [facility] ORS;  Service: Vascular;  Laterality: Right;   AV FISTULA PLACEMENT Right 05/13/2021   Procedure: INSERTION OF ARTERIOVENOUS (AV) GORE-TEX GRAFT ARM ( BRACHIAL AXILLARY );  Surgeon: Katha Cabal, MD;  Location: [facility] ORS;  Service: Vascular;  Laterality: Right;   [place]   pituitary tumor   BREAST EXCISIONAL BIOPSY Left yrs ago   benign   CESAREAN SECTION     CORONARY ARTERY BYPASS GRAFT  2018   x 4 vessels; performed in [place] N/A 11/07/2020   Procedure: DIALYSIS/PERMA CATHETER INSERTION;  Surgeon: Algernon Huxley, MD;  Location: [place] [facility];  Service: Cardiovascular;  Laterality: N/A;   DIALYSIS/PERMA CATHETER REMOVAL N/A 06/12/2021   Procedure: DIALYSIS/PERMA CATHETER REMOVAL;  Surgeon: Algernon Huxley, MD;  Location: [place] [facility];  Service: Cardiovascular;  Laterality: N/A;   ESOPHAGOGASTRODUODENOSCOPY (EGD) WITH PROPOFOL N/A 12/22/2021   Procedure: ESOPHAGOGASTRODUODENOSCOPY (EGD) WITH PROPOFOL;  Surgeon: Lesly Rubenstein, MD;  Location: [facility] ENDOSCOPY;  Service: Gastroenterology;  Laterality: N/A;  IDDM   EYE SURGERY Right 2021   cataracts    PERIPHERAL VASCULAR THROMBECTOMY Right 04/03/2021   Procedure: A/V Fistulagram ;  Surgeon: Katha Cabal, MD;  Location: [place] [facility];  Service: Cardiovascular;  Laterality: Right;   Family  History  Problem Relation Age of Onset   CAD Mother    CAD Brother    Breast cancer Sister 22   Social History   Tobacco Use   Smoking status: Former    Types: Cigarettes    Quit date: 2018    Years since quitting: 5.0   Smokeless tobacco: Never  Substance Use Topics   Alcohol use: Not Currently   Allergies  Allergen Reactions   Hydralazine Hives, Shortness Of Breath, Swelling and Rash    Body aches    Kiwi Extract Hives, Swelling and Rash    Mouth swells    Lisinopril Swelling    Angioedema   Shellfish Allergy Anaphylaxis    Shrimp/lobster  Betadine leaves welts on skin     Betadine [Povidone Iodine] Hives and Rash    LEAVES WELTS ON SKIN   Metformin Diarrhea and Other (See Comments)    GI Intolerance   Tape Itching    Use paper tape whenever possible   Prior to Admission medications   Medication Sig Start Date End Date Taking? Authorizing Provider  acetaminophen (TYLENOL) 500 MG tablet Take 500-1,000 mg by mouth every 6 (six) hours as needed for mild pain or moderate pain.   Yes [provider]  albuterol (VENTOLIN HFA) 108 (90 Base) MCG/ACT inhaler Inhale 2 puffs into the lungs every 6 (six) hours as needed for wheezing or shortness of breath.   Yes [provider]  amLODipine (NORVASC) 10 MG tablet Take 1 tablet (10 mg total) by mouth daily. 03/03/20  Yes Lorella Nimrod, MD  ammonium lactate (LAC-HYDRIN) 12 % lotion Apply 1 application topically daily.   Yes [provider]  aspirin 81 MG EC tablet Take 1 tablet (81 mg total) by mouth daily. 02/10/21  Yes Max Sane, MD  atorvastatin (LIPITOR) 80 MG tablet Take 1 tablet (80 mg total) by mouth daily at 6 PM. 11/26/19  Yes Lavina Hamman, MD  bismuth subsalicylate (PEPTO BISMOL) 262 MG/15ML suspension Take 30 mLs by mouth every 6 (six) hours as needed for indigestion or diarrhea or loose stools.   Yes [provider]  carvedilol (COREG) 25 MG tablet Take 25 mg by mouth 2 (two) times  daily.   Yes [provider]  cloNIDine (CATAPRES) 0.1 MG tablet Take 0.1 mg by mouth 2 (two) times daily. (Non-dialysis days)   Yes [provider]  fenofibrate (TRICOR) 145 MG tablet Take 145 mg by mouth daily.   Yes [provider]  furosemide (LASIX) 40 MG tablet Take 40 mg by mouth daily.   Yes [provider]  insulin aspart protamine- aspart (NOVOLOG MIX 70/30) (70-30) 100 UNIT/ML injection Inject 0.15 mLs (15 Units total) into the skin 2 (two) times daily with a meal. 11/12/20  Yes Fritzi Mandes, MD  isosorbide mononitrate (IMDUR) 30 MG 24 hr  tablet Take 30 mg by mouth daily.   Yes [provider]  loperamide (IMODIUM A-D) 2 MG tablet Take 4 mg by mouth 4 (four) times daily as needed for diarrhea or loose stools.   Yes [provider]  mirtazapine (REMERON) 30 MG tablet Take 1 tablet (30 mg total) by mouth at bedtime. 11/26/19  Yes Lavina Hamman, MD  mometasone-formoterol [facility]) 200-5 MCG/ACT AERO Inhale 2 puffs into the lungs 2 (two) times daily.   Yes [provider]  omeprazole (PRILOSEC) 40 MG capsule Take 40 mg by mouth daily. 09/22/20  Yes [provider]  polyethylene glycol (MIRALAX / GLYCOLAX) 17 g packet Take 17 g by mouth daily. 02/11/21  Yes Lorella Nimrod, MD  potassium chloride (KLOR-CON) 10 MEQ tablet Take 10 mEq by mouth daily.   Yes [provider]  spironolactone (ALDACTONE) 25 MG tablet Take 1 tablet (25 mg total) by mouth daily. 06/16/21  Yes Alma Friendly, MD  topiramate (TOPAMAX) 25 MG tablet Take 25 mg by mouth daily. 08/19/20  Yes [provider]  traZODone (DESYREL) 50 MG tablet Take 50 mg by mouth at bedtime.   Yes [provider]  vitamin B-12 (CYANOCOBALAMIN) 1000 MCG tablet Take 1,000 mcg by mouth daily.   Yes [provider]    Review of Systems  Constitutional:  Positive for fatigue (easily). Negative for appetite change.  HENT:  Negative for  congestion, postnasal drip and sore throat.   Eyes:  Positive for visual disturbance (blind in left eye). Negative for pain.  Respiratory:  Positive for cough and shortness of breath (easily).   Cardiovascular:  Positive for chest pain (at times; described as "stinging") and leg swelling. Negative for palpitations.  Gastrointestinal:  Positive for nausea and vomiting. Negative for abdominal distention and abdominal pain.  Endocrine: Negative.   Genitourinary: Negative.   Musculoskeletal:  Positive for back pain. Negative for neck pain.  Skin: Negative.   Allergic/Immunologic: Negative.   Neurological:  Positive for weakness, light-headedness and headaches. Negative for dizziness.  Hematological:  Negative for adenopathy. Does not bruise/bleed easily.  Psychiatric/Behavioral:  Negative for dysphoric mood and sleep disturbance (sleeping on 2 pillows). The patient is not nervous/anxious.    There were no vitals filed for this visit.  Wt Readings from Last 3 Encounters:  12/22/21 172 lb (78 kg)  12/03/21 165 lb (74.8 kg)  11/03/21 166 lb (75.3 kg)   Lab Results  Component Value Date   CREATININE 5.43 (H) 11/03/2021   CREATININE 4.48 (H) 10/29/2021   CREATININE 6.27 (H) 10/28/2021    Physical Exam Vitals and nursing note reviewed.  Constitutional:      Appearance: Normal appearance.  HENT:     Head: Normocephalic and atraumatic.  Cardiovascular:     Rate and Rhythm: Normal rate and regular rhythm.  Pulmonary:     Effort: Pulmonary effort is normal. No respiratory distress.     Breath sounds: No wheezing or rales.  Abdominal:     General: There is no distension.     Palpations: Abdomen is soft.  Musculoskeletal:        General: No tenderness.     Cervical back: Normal range of motion and neck supple.     Right lower leg: Edema (1+ pitting) present.     Left lower leg: Edema (1+ pitting) present.  Skin:    General: Skin is warm and dry.  Neurological:     Mental Status: She  is alert and  oriented to person, place, and time. Mental status is at baseline.  Psychiatric:        Mood and Affect: Affect normal.        Behavior: Behavior normal.        Thought Content: Thought content normal.   Assessment & Plan:  1: Chronic heart failure with reduced ejection fraction- - NYHA class III - euvolemic today - weighing at dialysis as she says that her weight fluctuates in between sessions - not adding salt and has been trying to look at food labels; reviewed the importance of closely following a 2000mg  sodium diet and a low sodium cookbook was given to her today - saw cardiology Clayborn Bigness) 07/14/21 - on GDMT of carvedilol & spironolactone - renal function limits entresto/ SGLT2 use & lisinopril caused angioedema - participating in paramedicine program - BNP 06/12/21 was 3201.7  2: HTN- - BP mildly elevated (155/73) - saw PCP Ouida Sills) 06/30/21  3: DM- - saw endocrinology Pasty Arch) 06/19/20 - A1c 06/29/21 was 8.4% - glucose at home this morning was 137  4: ESRD- - BMP from 06/29/21 reviewed and showed sodium 140, potassium 4.2, creatinine 6.4 and GFR 8 - saw nephrology ([place]) 06/18/21 - dialysis M, W, F  5: Lymphedema- - stage 2 - wearing compression socks daily and says that her swelling has improved - elevate legs when sitting for long periods of time - limited in her ability to exercise due to her symptoms - wearing compression boots that she ordered off of [employer] and she says that her swelling improves after using them; she wears them once a day   Medication bottles were reviewed.  Return in 4 months or sooner for any questions/ problems before then.

## 2021-12-28 ENCOUNTER — Other Ambulatory Visit (INDEPENDENT_AMBULATORY_CARE_PROVIDER_SITE_OTHER): Payer: Self-pay | Admitting: Nurse Practitioner

## 2021-12-28 DIAGNOSIS — N186 End stage renal disease: Secondary | ICD-10-CM

## 2021-12-28 DIAGNOSIS — N2581 Secondary hyperparathyroidism of renal origin: Secondary | ICD-10-CM | POA: Diagnosis not present

## 2021-12-28 DIAGNOSIS — Z992 Dependence on renal dialysis: Secondary | ICD-10-CM | POA: Diagnosis not present

## 2021-12-29 ENCOUNTER — Ambulatory Visit
Admission: RE | Admit: 2021-12-29 | Discharge: 2021-12-29 | Disposition: A | Discharge status: Home / Self Care | Payer: Medicare HMO | Attending: Vascular Surgery | Admitting: Vascular Surgery

## 2021-12-29 ENCOUNTER — Encounter: Payer: Self-pay | Admitting: Vascular Surgery

## 2021-12-29 ENCOUNTER — Ambulatory Visit: Payer: Medicare HMO | Admitting: Family

## 2021-12-29 ENCOUNTER — Encounter
Admission: RE | Disposition: A | Discharge status: Home / Self Care | Payer: Self-pay | Source: Home / Self Care | Attending: Vascular Surgery

## 2021-12-29 ENCOUNTER — Other Ambulatory Visit: Payer: Self-pay

## 2021-12-29 DIAGNOSIS — I132 Hypertensive heart and chronic kidney disease with heart failure and with stage 5 chronic kidney disease, or end stage renal disease: Secondary | ICD-10-CM | POA: Insufficient documentation

## 2021-12-29 DIAGNOSIS — N186 End stage renal disease: Secondary | ICD-10-CM | POA: Diagnosis not present

## 2021-12-29 DIAGNOSIS — E1122 Type 2 diabetes mellitus with diabetic chronic kidney disease: Secondary | ICD-10-CM | POA: Diagnosis not present

## 2021-12-29 DIAGNOSIS — N185 Chronic kidney disease, stage 5: Secondary | ICD-10-CM | POA: Diagnosis not present

## 2021-12-29 DIAGNOSIS — I509 Heart failure, unspecified: Secondary | ICD-10-CM | POA: Insufficient documentation

## 2021-12-29 DIAGNOSIS — T82898A Other specified complication of vascular prosthetic devices, implants and grafts, initial encounter: Secondary | ICD-10-CM | POA: Diagnosis not present

## 2021-12-29 DIAGNOSIS — Y841 Kidney dialysis as the cause of abnormal reaction of the patient, or of later complication, without mention of misadventure at the time of the procedure: Secondary | ICD-10-CM | POA: Insufficient documentation

## 2021-12-29 DIAGNOSIS — Z992 Dependence on renal dialysis: Secondary | ICD-10-CM | POA: Diagnosis not present

## 2021-12-29 DIAGNOSIS — Z91013 Allergy to seafood: Secondary | ICD-10-CM | POA: Insufficient documentation

## 2021-12-29 DIAGNOSIS — T82858A Stenosis of vascular prosthetic devices, implants and grafts, initial encounter: Secondary | ICD-10-CM | POA: Insufficient documentation

## 2021-12-29 HISTORY — PX: A/V FISTULAGRAM: CATH118298

## 2021-12-29 LAB — GLUCOSE, CAPILLARY: Glucose-Capillary: 197 mg/dL — ABNORMAL HIGH (ref 70–99)

## 2021-12-29 LAB — POTASSIUM (ARMC VASCULAR LAB ONLY): Potassium (ARMC vascular lab): 5.1 (ref 3.5–5.1)

## 2021-12-29 SURGERY — A/V FISTULAGRAM
Anesthesia: Moderate Sedation | Laterality: Right

## 2021-12-29 MED ORDER — FENTANYL CITRATE PF 50 MCG/ML IJ SOSY
PREFILLED_SYRINGE | INTRAMUSCULAR | Status: AC
  Filled 2021-12-29: qty 1

## 2021-12-29 MED ORDER — HEPARIN SODIUM (PORCINE) 1000 UNIT/ML IJ SOLN
INTRAMUSCULAR | Status: DC | PRN
  Administered 2021-12-29: 4000 [IU] via INTRAVENOUS

## 2021-12-29 MED ORDER — FENTANYL CITRATE (PF) 100 MCG/2ML IJ SOLN
INTRAMUSCULAR | Status: AC
  Filled 2021-12-29: qty 2

## 2021-12-29 MED ORDER — FENTANYL CITRATE (PF) 100 MCG/2ML IJ SOLN
INTRAMUSCULAR | Status: DC | PRN
  Administered 2021-12-29: 50 ug via INTRAVENOUS

## 2021-12-29 MED ORDER — FAMOTIDINE 20 MG PO TABS: 40.0000 mg | ORAL_TABLET | Freq: Once | ORAL | Status: AC | PRN

## 2021-12-29 MED ORDER — DIPHENHYDRAMINE HCL 50 MG/ML IJ SOLN
INTRAMUSCULAR | Status: AC
  Administered 2021-12-29: 50 mg via INTRAVENOUS
  Filled 2021-12-29: qty 1

## 2021-12-29 MED ORDER — SODIUM CHLORIDE 0.9 % IV SOLN: INTRAVENOUS | Status: DC

## 2021-12-29 MED ORDER — METHYLPREDNISOLONE SODIUM SUCC 125 MG IJ SOLR: 125.0000 mg | Freq: Once | INTRAMUSCULAR | Status: AC | PRN

## 2021-12-29 MED ORDER — MIDAZOLAM HCL 2 MG/2ML IJ SOLN
INTRAMUSCULAR | Status: DC | PRN
  Administered 2021-12-29: 2 mg via INTRAVENOUS

## 2021-12-29 MED ORDER — CEFAZOLIN SODIUM-DEXTROSE 1-4 GM/50ML-% IV SOLN
1.0000 g | Freq: Once | INTRAVENOUS | Status: AC
  Administered 2021-12-29: 1 g via INTRAVENOUS

## 2021-12-29 MED ORDER — MIDAZOLAM HCL 2 MG/ML PO SYRP: 8.0000 mg | ORAL_SOLUTION | Freq: Once | ORAL | Status: DC | PRN

## 2021-12-29 MED ORDER — MIDAZOLAM HCL 2 MG/2ML IJ SOLN
INTRAMUSCULAR | Status: AC
  Filled 2021-12-29: qty 2

## 2021-12-29 MED ORDER — METHYLPREDNISOLONE SODIUM SUCC 125 MG IJ SOLR
INTRAMUSCULAR | Status: AC
  Administered 2021-12-29: 125 mg via INTRAVENOUS
  Filled 2021-12-29: qty 2

## 2021-12-29 MED ORDER — FAMOTIDINE 20 MG PO TABS
ORAL_TABLET | ORAL | Status: AC
  Administered 2021-12-29: 40 mg via ORAL
  Filled 2021-12-29: qty 2

## 2021-12-29 MED ORDER — HEPARIN SODIUM (PORCINE) 1000 UNIT/ML IJ SOLN
INTRAMUSCULAR | Status: AC
  Filled 2021-12-29: qty 10

## 2021-12-29 MED ORDER — DIPHENHYDRAMINE HCL 50 MG/ML IJ SOLN: 50.0000 mg | Freq: Once | INTRAMUSCULAR | Status: AC | PRN

## 2021-12-29 MED ORDER — IODIXANOL 320 MG/ML IV SOLN
INTRAVENOUS | Status: DC | PRN
  Administered 2021-12-29: 25 mL via INTRAVENOUS

## 2021-12-29 SURGICAL SUPPLY — 13 items
BALLN DORADO 8X60X80 (BALLOONS) ×2
BALLOON DORADO 8X60X80 (BALLOONS) IMPLANT
CANNULA 5F STIFF (CANNULA) ×1 IMPLANT
COVER PROBE U/S 5X48 (MISCELLANEOUS) ×1 IMPLANT
DRAPE BRACHIAL (DRAPES) ×1 IMPLANT
GAUZE SPONGE 4X4 12PLY STRL (GAUZE/BANDAGES/DRESSINGS) ×2 IMPLANT
KIT ENCORE 26 ADVANTAGE (KITS) ×1 IMPLANT
PACK ANGIOGRAPHY (CUSTOM PROCEDURE TRAY) ×2 IMPLANT
SHEATH BRITE TIP 6FRX5.5 (SHEATH) ×1 IMPLANT
SHEATH BRITE TIP 7FRX5.5 (SHEATH) ×1 IMPLANT
STENT COVERA FLARED 8X60X80 (Permanent Stent) ×1 IMPLANT
SUT MNCRL AB 4-0 PS2 18 (SUTURE) ×1 IMPLANT
WIRE MAGIC TOR.035 180C (WIRE) ×1 IMPLANT

## 2021-12-29 NOTE — Interval H&P Note (Signed)
History and Physical Interval Note:  12/29/2021 7:53 AM  Terry Arroyo  has presented today for surgery, with the diagnosis of RT Arm Fistulagram   ESRD    SHELLFISH ALLERGY.  The various methods of treatment have been discussed with the patient and family. After consideration of risks, benefits and other options for treatment, the patient has consented to  Procedure(s): A/V FISTULAGRAM (Right) as a surgical intervention.  The patient's history has been reviewed, patient examined, no change in status, stable for surgery.  I have reviewed the patient's chart and labs.  Questions were answered to the patient's satisfaction.     [name]

## 2021-12-29 NOTE — Op Note (Signed)
OPERATIVE NOTE   PROCEDURE: Contrast injection right brachial axillary AV access Percutaneous transluminal angioplasty and stent placement venous outflow right brachial axillary AV graft  PRE-OPERATIVE DIAGNOSIS: Complication of dialysis access                                                       End Stage Renal Disease  POST-OPERATIVE DIAGNOSIS: same as above   SURGEON: Katha Cabal, M.D.  ANESTHESIA: Conscious sedation was administered under my direct supervision by the interventional radiology RN. IV Versed plus fentanyl were utilized. Continuous ECG, pulse oximetry and blood pressure was monitored throughout the entire procedure.  Conscious sedation was for a total of 27 minutes and 40 seconds.  ESTIMATED BLOOD LOSS: minimal  FINDING(S): Greater than 80% stenosis at the venous outflow  SPECIMEN(S):  None  CONTRAST: 25 cc  FLUOROSCOPY TIME: 1.3 minutes  INDICATIONS: Terry Arroyo is a 61 y.o. female who  presents with malfunctioning right arm AV access.  The patient is scheduled for angiography with possible intervention of the AV access.  The patient is aware the risks include but are not limited to: bleeding, infection, thrombosis of the cannulated access, and possible anaphylactic reaction to the contrast.  The patient acknowledges if the access can not be salvaged a tunneled catheter will be needed and will be placed during this procedure.  The patient is aware of the risks of the procedure and elects to proceed with the angiogram and intervention.  DESCRIPTION: After full informed written consent was obtained, the patient was brought back to the Special Procedure suite and placed supine position.  Appropriate cardiopulmonary monitors were placed.  The right arm was prepped and draped in the standard fashion.  Appropriate timeout is called. The right brachial axillary AV graft was cannulated with a micropuncture needle.  Cannulation was performed with ultrasound  guidance. Ultrasound was placed in a sterile sleeve, the AV access was interrogated and noted to be echolucent and compressible indicating patency. Image was recorded for the permanent record. The puncture is performed under continuous ultrasound visualization.   The microwire was advanced and the needle was exchanged for  a microsheath.  The J-wire was then advanced and a 6 Fr sheath inserted.  Hand injections were completed to image the access from the arterial anastomosis through the entire access.  The central venous structures were also imaged by hand injections.  Based on the images,  4000 units of heparin was given and a wire was negotiated through the strictures within the venous portion of the graft.  An 8 mm x 60 mm flared Covera was deployed across the stenoses and postdilated with an 8 mm [place] balloon.  Follow-up imaging demonstrates complete resolution of the stricture with rapid flow of contrast through the graft, the central venous anatomy is preserved.  A 4-0 Monocryl purse-string suture was sewn around the sheath.  The sheath was removed and light pressure was applied.  A sterile bandage was applied to the puncture site.  Interpretation: Initial images show the graft is in good condition there is a greater than 80% stenosis at the venous anastomosis.  The more proximal axillary vein, subclavian vein, innominate vein and superior vena cava are widely patent.  Reflux image shows the arterial anastomosis is widely patent.  Following angioplasty and stent placement there is less  than 10% residual stenosis  COMPLICATIONS: None  CONDITION: Terry Arroyo, M.D [facility] Office: [phone]  12/29/2021 8:53 AM

## 2021-12-30 DIAGNOSIS — N2581 Secondary hyperparathyroidism of renal origin: Secondary | ICD-10-CM | POA: Diagnosis not present

## 2021-12-30 DIAGNOSIS — N186 End stage renal disease: Secondary | ICD-10-CM | POA: Diagnosis not present

## 2021-12-30 DIAGNOSIS — Z992 Dependence on renal dialysis: Secondary | ICD-10-CM | POA: Diagnosis not present

## 2021-12-31 DIAGNOSIS — Z992 Dependence on renal dialysis: Secondary | ICD-10-CM | POA: Diagnosis not present

## 2021-12-31 DIAGNOSIS — F3340 Major depressive disorder, recurrent, in remission, unspecified: Secondary | ICD-10-CM | POA: Diagnosis not present

## 2021-12-31 DIAGNOSIS — J449 Chronic obstructive pulmonary disease, unspecified: Secondary | ICD-10-CM | POA: Diagnosis not present

## 2021-12-31 DIAGNOSIS — I25118 Atherosclerotic heart disease of native coronary artery with other forms of angina pectoris: Secondary | ICD-10-CM | POA: Diagnosis not present

## 2021-12-31 DIAGNOSIS — N186 End stage renal disease: Secondary | ICD-10-CM | POA: Diagnosis not present

## 2021-12-31 DIAGNOSIS — E1122 Type 2 diabetes mellitus with diabetic chronic kidney disease: Secondary | ICD-10-CM | POA: Diagnosis not present

## 2021-12-31 DIAGNOSIS — N2581 Secondary hyperparathyroidism of renal origin: Secondary | ICD-10-CM | POA: Diagnosis not present

## 2021-12-31 DIAGNOSIS — T466X5A Adverse effect of antihyperlipidemic and antiarteriosclerotic drugs, initial encounter: Secondary | ICD-10-CM | POA: Diagnosis not present

## 2021-12-31 DIAGNOSIS — I10 Essential (primary) hypertension: Secondary | ICD-10-CM | POA: Diagnosis not present

## 2022-01-01 DIAGNOSIS — N186 End stage renal disease: Secondary | ICD-10-CM | POA: Diagnosis not present

## 2022-01-01 DIAGNOSIS — Z992 Dependence on renal dialysis: Secondary | ICD-10-CM | POA: Diagnosis not present

## 2022-01-01 DIAGNOSIS — N2581 Secondary hyperparathyroidism of renal origin: Secondary | ICD-10-CM | POA: Diagnosis not present

## 2022-01-04 DIAGNOSIS — N186 End stage renal disease: Secondary | ICD-10-CM | POA: Diagnosis not present

## 2022-01-04 DIAGNOSIS — Z992 Dependence on renal dialysis: Secondary | ICD-10-CM | POA: Diagnosis not present

## 2022-01-04 DIAGNOSIS — N2581 Secondary hyperparathyroidism of renal origin: Secondary | ICD-10-CM | POA: Diagnosis not present

## 2022-01-06 DIAGNOSIS — N186 End stage renal disease: Secondary | ICD-10-CM | POA: Diagnosis not present

## 2022-01-06 DIAGNOSIS — N2581 Secondary hyperparathyroidism of renal origin: Secondary | ICD-10-CM | POA: Diagnosis not present

## 2022-01-06 DIAGNOSIS — Z992 Dependence on renal dialysis: Secondary | ICD-10-CM | POA: Diagnosis not present

## 2022-01-08 DIAGNOSIS — Z992 Dependence on renal dialysis: Secondary | ICD-10-CM | POA: Diagnosis not present

## 2022-01-08 DIAGNOSIS — N186 End stage renal disease: Secondary | ICD-10-CM | POA: Diagnosis not present

## 2022-01-08 DIAGNOSIS — N2581 Secondary hyperparathyroidism of renal origin: Secondary | ICD-10-CM | POA: Diagnosis not present

## 2022-01-11 DIAGNOSIS — E119 Type 2 diabetes mellitus without complications: Secondary | ICD-10-CM | POA: Diagnosis not present

## 2022-01-11 DIAGNOSIS — N2581 Secondary hyperparathyroidism of renal origin: Secondary | ICD-10-CM | POA: Diagnosis not present

## 2022-01-11 DIAGNOSIS — N186 End stage renal disease: Secondary | ICD-10-CM | POA: Diagnosis not present

## 2022-01-11 DIAGNOSIS — Z992 Dependence on renal dialysis: Secondary | ICD-10-CM | POA: Diagnosis not present

## 2022-01-11 DIAGNOSIS — Z794 Long term (current) use of insulin: Secondary | ICD-10-CM | POA: Diagnosis not present

## 2022-01-13 DIAGNOSIS — N186 End stage renal disease: Secondary | ICD-10-CM | POA: Diagnosis not present

## 2022-01-13 DIAGNOSIS — N2581 Secondary hyperparathyroidism of renal origin: Secondary | ICD-10-CM | POA: Diagnosis not present

## 2022-01-13 DIAGNOSIS — Z992 Dependence on renal dialysis: Secondary | ICD-10-CM | POA: Diagnosis not present

## 2022-01-14 ENCOUNTER — Encounter: Payer: Self-pay | Admitting: Family

## 2022-01-14 ENCOUNTER — Other Ambulatory Visit: Payer: Self-pay

## 2022-01-14 ENCOUNTER — Ambulatory Visit: Payer: Medicare HMO | Attending: Family | Admitting: Family

## 2022-01-14 VITALS — BP 158/76 | HR 67 | Resp 20 | Ht 66.0 in | Wt 169.0 lb

## 2022-01-14 DIAGNOSIS — I10 Essential (primary) hypertension: Secondary | ICD-10-CM | POA: Diagnosis not present

## 2022-01-14 DIAGNOSIS — N186 End stage renal disease: Secondary | ICD-10-CM

## 2022-01-14 DIAGNOSIS — Z87891 Personal history of nicotine dependence: Secondary | ICD-10-CM | POA: Diagnosis not present

## 2022-01-14 DIAGNOSIS — Z8249 Family history of ischemic heart disease and other diseases of the circulatory system: Secondary | ICD-10-CM | POA: Diagnosis not present

## 2022-01-14 DIAGNOSIS — E1151 Type 2 diabetes mellitus with diabetic peripheral angiopathy without gangrene: Secondary | ICD-10-CM | POA: Insufficient documentation

## 2022-01-14 DIAGNOSIS — K529 Noninfective gastroenteritis and colitis, unspecified: Secondary | ICD-10-CM | POA: Insufficient documentation

## 2022-01-14 DIAGNOSIS — Z794 Long term (current) use of insulin: Secondary | ICD-10-CM

## 2022-01-14 DIAGNOSIS — E1136 Type 2 diabetes mellitus with diabetic cataract: Secondary | ICD-10-CM | POA: Diagnosis not present

## 2022-01-14 DIAGNOSIS — I132 Hypertensive heart and chronic kidney disease with heart failure and with stage 5 chronic kidney disease, or end stage renal disease: Secondary | ICD-10-CM | POA: Insufficient documentation

## 2022-01-14 DIAGNOSIS — E1122 Type 2 diabetes mellitus with diabetic chronic kidney disease: Secondary | ICD-10-CM | POA: Diagnosis not present

## 2022-01-14 DIAGNOSIS — H5462 Unqualified visual loss, left eye, normal vision right eye: Secondary | ICD-10-CM | POA: Insufficient documentation

## 2022-01-14 DIAGNOSIS — I5022 Chronic systolic (congestive) heart failure: Secondary | ICD-10-CM

## 2022-01-14 DIAGNOSIS — Z992 Dependence on renal dialysis: Secondary | ICD-10-CM | POA: Diagnosis not present

## 2022-01-14 DIAGNOSIS — R202 Paresthesia of skin: Secondary | ICD-10-CM | POA: Diagnosis not present

## 2022-01-14 DIAGNOSIS — Z86018 Personal history of other benign neoplasm: Secondary | ICD-10-CM | POA: Diagnosis not present

## 2022-01-14 DIAGNOSIS — Z79899 Other long term (current) drug therapy: Secondary | ICD-10-CM | POA: Insufficient documentation

## 2022-01-14 DIAGNOSIS — E785 Hyperlipidemia, unspecified: Secondary | ICD-10-CM | POA: Insufficient documentation

## 2022-01-14 NOTE — Progress Notes (Signed)
Patient ID: Terry Arroyo, female    DOB: 1961/04/29, 61 y.o.   MRN: [id number]  HPI  Terry Arroyo is a 61 y/o female with a history of DM, hyperlipidemia, HTN, brain tumor, left eye blindness, previous tobacco use and chronic heart failure.   Echo report from 06/13/21 reviewed and showed an EF of 35-40% along with mild MR and mild/moderate TR. Echo report from 02/27/20 reviewed and showed an EF of 30-35% along with mild MR.   Was in the ED 11/04/21 due to gastroenteritis where she was evaluated and released.   She presents today for a follow-up visit with a chief complaint of moderate fatigue with minimal exertion. She describes this as having been present for several years. She has associated cough, shortness of breath, pedal edema, palpitations, headaches, light-headedness, leg tingling, weakness and difficulty sleeping (due to legs) along with this. She denies any abdominal distention, chest pain or weight gain.   She says that her biggest complaint is of her leg tingling/ discomfort that only bothers her when she's sitting for long periods or laying down. Once she gets up and moves around, the sensation passes.   Past Medical History:  Diagnosis Date   Anemia    Angina at [facility])    Angioedema 03/20/2021   Lisinopril   Anterior cerebral aneurysm    approximately 5 mm; left MCA    Anxiety    Aortic atherosclerosis (HCC)    Asthma    Blind left eye    COPD (chronic obstructive pulmonary disease) (HCC)    Coronary artery disease    Depression    DOE (dyspnea on exertion)    ESRD (end stage renal disease) (HCC)    Gait instability    Genital herpes    GERD (gastroesophageal reflux disease)    HFrEF (heart failure with reduced ejection fraction) ([place])    History of 2019 novel coronavirus disease (COVID-19) 03/2019   [place]   History of DVT (deep vein thrombosis)    History of DVT (deep vein thrombosis)    Hx of CABG 2018   x 4; performed in [place]    Hyperlipidemia    Hypertension    MI (myocardial infarction) ([place]) 2018   no stents   Pituitary adenoma ([place])    PVD (posterior vitreous detachment), right eye    Secondary hyperparathyroidism ([place])    Sleep apnea    non-compliant with nocturnal PAP therapy   Stroke [facility]) 2014   stated affected left side.   T2DM (type 2 diabetes mellitus) [facility])    Past Surgical History:  Procedure Laterality Date   A/V FISTULAGRAM Right 12/29/2021   Procedure: A/V FISTULAGRAM;  Surgeon: Katha Cabal, MD;  Location: [place] CV LAB;  Service: Cardiovascular;  Laterality: Right;   AV FISTULA PLACEMENT Right 01/28/2021   Procedure: ARTERIOVENOUS (AV) FISTULA CREATION;  Surgeon: Katha Cabal, MD;  Location: [facility] ORS;  Service: Vascular;  Laterality: Right;   AV FISTULA PLACEMENT Right 05/13/2021   Procedure: INSERTION OF ARTERIOVENOUS (AV) GORE-TEX GRAFT ARM ( BRACHIAL AXILLARY );  Surgeon: Katha Cabal, MD;  Location: [facility] ORS;  Service: Vascular;  Laterality: Right;   Terry Arroyo   pituitary tumor   BREAST EXCISIONAL BIOPSY Left yrs ago   benign   CESAREAN SECTION     CORONARY ARTERY BYPASS GRAFT  2018   x 4 vessels; performed in [place] N/A 11/07/2020  Procedure: DIALYSIS/PERMA CATHETER INSERTION;  Surgeon: Algernon Huxley, MD;  Location: [place] CV LAB;  Service: Cardiovascular;  Laterality: N/A;   DIALYSIS/PERMA CATHETER REMOVAL N/A 06/12/2021   Procedure: DIALYSIS/PERMA CATHETER REMOVAL;  Surgeon: Algernon Huxley, MD;  Location: [place] CV [facility];  Service: Cardiovascular;  Laterality: N/A;   ESOPHAGOGASTRODUODENOSCOPY (EGD) WITH PROPOFOL N/A 12/22/2021   Procedure: ESOPHAGOGASTRODUODENOSCOPY (EGD) WITH PROPOFOL;  Surgeon: Lesly Rubenstein, MD;  Location: [facility] ENDOSCOPY;  Service: Gastroenterology;  Laterality: N/A;  IDDM   EYE SURGERY Right 2021   cataracts    PERIPHERAL VASCULAR THROMBECTOMY Right 04/03/2021   Procedure:  A/V Fistulagram ;  Surgeon: Katha Cabal, MD;  Location: [address] CV [facility];  Service: Cardiovascular;  Laterality: Right;   Family History  Problem Relation Age of Onset   CAD Mother    CAD Brother    Breast cancer Sister 55   Social History   Tobacco Use   Smoking status: Former    Types: Cigarettes    Quit date: 2018    Years since quitting: 5.0   Smokeless tobacco: Never  Substance Use Topics   Alcohol use: Not Currently   Allergies  Allergen Reactions   Hydralazine Hives, Shortness Of Breath, Swelling and Rash    Body aches    Kiwi Extract Hives, Swelling and Rash    Mouth swells    Lisinopril Swelling    Angioedema   Shellfish Allergy Anaphylaxis    Shrimp/lobster  Betadine leaves welts on skin     Betadine [Povidone Iodine] Hives and Rash    LEAVES WELTS ON SKIN   Metformin Diarrhea and Other (See Comments)    GI Intolerance   Tape Itching    Use paper tape whenever possible   Prior to Admission medications   Medication Sig Start Date End Date Taking? Authorizing Provider  acetaminophen (TYLENOL) 500 MG tablet Take 1,000 mg by mouth every 6 (six) hours as needed for mild pain or moderate pain.   Yes [provider]  albuterol (VENTOLIN HFA) 108 (90 Base) MCG/ACT inhaler Inhale 2 puffs into the lungs every 6 (six) hours as needed for wheezing or shortness of breath.   Yes [provider]  amLODipine (NORVASC) 10 MG tablet Take 1 tablet (10 mg total) by mouth daily. 03/03/20  Yes Lorella Nimrod, MD  ammonium lactate (LAC-HYDRIN) 12 % lotion Apply 1 application topically every Monday, Wednesday, and Friday.   Yes [provider]  aspirin 81 MG EC tablet Take 1 tablet (81 mg total) by mouth daily. 02/10/21  Yes Max Sane, MD  atorvastatin (LIPITOR) 80 MG tablet Take 1 tablet (80 mg total) by mouth daily at 6 PM. Patient taking differently: Take 80 mg by mouth daily. 11/26/19  Yes Lavina Hamman, MD  Blood Glucose Monitoring Suppl  (TRUE METRIX METER) w/Device KIT  09/02/21  Yes [provider]  carvedilol (COREG) 25 MG tablet Take 25 mg by mouth 2 (two) times daily.   Yes [provider]  furosemide (LASIX) 80 MG tablet Take 80 mg by mouth 4 (four) times a week. Tues, Thurs, Sat, and Sun (non-dialysis days)   Yes [provider]  gabapentin (NEURONTIN) 100 MG capsule Take 100 mg by mouth daily as needed (pain). 10/14/21  Yes [provider]  insulin aspart protamine- aspart (NOVOLOG MIX 70/30) (70-30) 100 UNIT/ML injection Inject 0.15 mLs (15 Units total) into the skin 2 (two) times daily with a meal. Patient taking differently: Inject 15  Units into the skin 3 (three) times daily with meals. 11/12/20  Yes Fritzi Mandes, MD  isosorbide mononitrate (IMDUR) 30 MG 24 hr tablet Take 30 mg by mouth daily.   Yes [provider]  lidocaine-prilocaine (EMLA) cream Apply 1 application topically as needed (fistula access).   Yes [provider]  mirtazapine (REMERON) 30 MG tablet Take 30 mg by mouth at bedtime.   Yes [provider]  omeprazole (PRILOSEC) 40 MG capsule Take 40 mg by mouth daily. 09/22/20  Yes [provider]  ondansetron (ZOFRAN ODT) 4 MG disintegrating tablet Take 1 tablet (4 mg total) by mouth every 8 (eight) hours as needed. 11/04/21  Yes Veronese, [place], MD  polyethylene glycol (MIRALAX / GLYCOLAX) 17 g packet Take 17 g by mouth daily. Patient taking differently: Take 17 g by mouth daily as needed for moderate constipation. 02/11/21  Yes Lorella Nimrod, MD  sucralfate (CARAFATE) 1 g tablet Take 1 g by mouth 2 (two) times daily before a meal.   Yes [provider]  topiramate (TOPAMAX) 25 MG tablet Take 25 mg by mouth daily. 08/19/20  Yes [provider]  traZODone (DESYREL) 50 MG tablet Take 50 mg by mouth at bedtime.   Yes [provider]  TRUE METRIX BLOOD GLUCOSE TEST test strip  09/04/21  Yes [provider]   vitamin B-12 (CYANOCOBALAMIN) 1000 MCG tablet Take 1,000 mcg by mouth daily.   Yes [provider]  multivitamin (RENA-VIT) TABS tablet Take 1 tablet by mouth every Monday, Wednesday, and Friday.    [provider]   Review of Systems  Constitutional:  Positive for fatigue (easily). Negative for appetite change.  HENT:  Negative for congestion, postnasal drip and sore throat.   Eyes:  Positive for visual disturbance (blind in left eye). Negative for pain.  Respiratory:  Positive for cough and shortness of breath (easily).   Cardiovascular:  Positive for palpitations (at times) and leg swelling. Negative for chest pain.  Gastrointestinal:  Positive for nausea and vomiting. Negative for abdominal distention and abdominal pain.  Endocrine: Negative.   Genitourinary: Negative.   Musculoskeletal:  Positive for arthralgias (leg hurt) and back pain. Negative for neck pain.  Skin: Negative.   Allergic/Immunologic: Negative.   Neurological:  Positive for weakness, light-headedness, numbness (tingling in legs) and headaches. Negative for dizziness.  Hematological:  Negative for adenopathy. Does not bruise/bleed easily.  Psychiatric/Behavioral:  Positive for sleep disturbance (sleeping on 2 pillows; trouble sleeping due to leg tingling). Negative for dysphoric mood. The patient is not nervous/anxious.    Vitals:   01/14/22 1106  BP: (!) 158/76  Pulse: 67  Resp: 20  SpO2: 98%  Weight: 169 lb (76.7 kg)  Height: _0  (1.676 m)   Wt Readings from Last 3 Encounters:  01/14/22 169 lb (76.7 kg)  12/29/21 172 lb (78 kg)  12/22/21 172 lb (78 kg)   Lab Results  Component Value Date   CREATININE 5.43 (H) 11/03/2021   CREATININE 4.48 (H) 10/29/2021   CREATININE 6.27 (H) 10/28/2021    Physical Exam Vitals and nursing note reviewed.  Constitutional:      Appearance: Normal appearance.  HENT:     Head: Normocephalic and atraumatic.  Cardiovascular:     Rate and Rhythm:  Normal rate and regular rhythm.  Pulmonary:     Effort: Pulmonary effort is normal. No respiratory distress.     Breath sounds: No wheezing or rales.  Abdominal:     General:  There is no distension.     Palpations: Abdomen is soft.  Musculoskeletal:        General: No tenderness.     Cervical back: Normal range of motion and neck supple.     Right lower leg: No edema.     Left lower leg: No edema.  Skin:    General: Skin is warm and dry.  Neurological:     Mental Status: She is alert and oriented to person, place, and time. Mental status is at baseline.  Psychiatric:        Mood and Affect: Affect normal.        Behavior: Behavior normal.        Thought Content: Thought content normal.   Assessment & Plan:  1: Chronic heart failure with reduced ejection fraction- - NYHA class III - euvolemic today - weighing at dialysis as she says that her weight fluctuates in between sessions - weight down 3 pounds from last visit here 5 months ago - not adding salt and has been trying to look at food labels; reviewed the importance of closely following a 2067m sodium diet  - saw cardiology ([facility]born Bigness 11/10/21 - on GDMT of carvedilol  - renal function limits entresto/ SGLT2/ spironolactone use & lisinopril caused angioedema - participating in paramedicine program - BNP 06/12/21 was 3201.7  2: HTN- - BP mildly elevated (158/76) - saw PCP ([place] 12/31/21  3: DM- - saw endocrinology ([place] 06/19/20 - A1c 12/31/21 was 8.3%  4: ESRD- - BMP from 12/31/21 reviewed and showed sodium 139, potassium 4.8, creatinine 4.7 and GFR 12 - saw nephrology ([place]) 11/18/21 - saw vascular ([place] 12/03/21 - dialysis M, W, F   Medication bottles were reviewed.  Due to HF stability, will not make a return appointment for patient at this time. Advised patient to continue with close follow-up of her other providers but that she could return at anytime for any questions/problems. Patient was  comfortable with this plan.

## 2022-01-14 NOTE — Patient Instructions (Addendum)
Continue weighing daily and call for an overnight weight gain of 3 pounds or more or a weekly weight gain of more than 5 pounds.   The [facility] will be moving around the corner to [id number] mid-February. Our phone number will remain the same    [place] [facility] department at [phone] to schedule follow-up appointment     Call us in the future if you need [place] for anything

## 2022-01-15 DIAGNOSIS — N2581 Secondary hyperparathyroidism of renal origin: Secondary | ICD-10-CM | POA: Diagnosis not present

## 2022-01-15 DIAGNOSIS — N186 End stage renal disease: Secondary | ICD-10-CM | POA: Diagnosis not present

## 2022-01-15 DIAGNOSIS — Z992 Dependence on renal dialysis: Secondary | ICD-10-CM | POA: Diagnosis not present

## 2022-01-18 DIAGNOSIS — N186 End stage renal disease: Secondary | ICD-10-CM | POA: Diagnosis not present

## 2022-01-18 DIAGNOSIS — N2581 Secondary hyperparathyroidism of renal origin: Secondary | ICD-10-CM | POA: Diagnosis not present

## 2022-01-18 DIAGNOSIS — Z992 Dependence on renal dialysis: Secondary | ICD-10-CM | POA: Diagnosis not present

## 2022-01-19 ENCOUNTER — Other Ambulatory Visit (HOSPITAL_COMMUNITY): Payer: Self-pay

## 2022-01-19 DIAGNOSIS — N186 End stage renal disease: Secondary | ICD-10-CM | POA: Diagnosis not present

## 2022-01-19 DIAGNOSIS — Z992 Dependence on renal dialysis: Secondary | ICD-10-CM | POA: Diagnosis not present

## 2022-01-19 NOTE — Progress Notes (Signed)
[place] states she has had a very busy month.  With surgery, HF clinic appt, surgeon appts and dialysis.  She states doing ok, healing up.  She states stays tired a lot.  She has not missed any appts or dialysis.  She has everything she needs for daily living.  Will make a home visit next week for heart failure, diet and medication management.   [place] [phone]

## 2022-01-20 ENCOUNTER — Other Ambulatory Visit (INDEPENDENT_AMBULATORY_CARE_PROVIDER_SITE_OTHER): Payer: Self-pay | Admitting: Vascular Surgery

## 2022-01-20 DIAGNOSIS — N2581 Secondary hyperparathyroidism of renal origin: Secondary | ICD-10-CM | POA: Diagnosis not present

## 2022-01-20 DIAGNOSIS — Z992 Dependence on renal dialysis: Secondary | ICD-10-CM

## 2022-01-20 DIAGNOSIS — N186 End stage renal disease: Secondary | ICD-10-CM

## 2022-01-21 ENCOUNTER — Ambulatory Visit (INDEPENDENT_AMBULATORY_CARE_PROVIDER_SITE_OTHER): Payer: Medicare HMO | Admitting: Nurse Practitioner

## 2022-01-21 ENCOUNTER — Encounter (INDEPENDENT_AMBULATORY_CARE_PROVIDER_SITE_OTHER): Payer: Medicare HMO

## 2022-01-22 DIAGNOSIS — N186 End stage renal disease: Secondary | ICD-10-CM | POA: Diagnosis not present

## 2022-01-22 DIAGNOSIS — Z992 Dependence on renal dialysis: Secondary | ICD-10-CM | POA: Diagnosis not present

## 2022-01-22 DIAGNOSIS — N2581 Secondary hyperparathyroidism of renal origin: Secondary | ICD-10-CM | POA: Diagnosis not present

## 2022-01-25 DIAGNOSIS — Z992 Dependence on renal dialysis: Secondary | ICD-10-CM | POA: Diagnosis not present

## 2022-01-25 DIAGNOSIS — N2581 Secondary hyperparathyroidism of renal origin: Secondary | ICD-10-CM | POA: Diagnosis not present

## 2022-01-25 DIAGNOSIS — N186 End stage renal disease: Secondary | ICD-10-CM | POA: Diagnosis not present

## 2022-01-27 DIAGNOSIS — N2581 Secondary hyperparathyroidism of renal origin: Secondary | ICD-10-CM | POA: Diagnosis not present

## 2022-01-27 DIAGNOSIS — Z992 Dependence on renal dialysis: Secondary | ICD-10-CM | POA: Diagnosis not present

## 2022-01-27 DIAGNOSIS — N186 End stage renal disease: Secondary | ICD-10-CM | POA: Diagnosis not present

## 2022-01-28 DIAGNOSIS — K219 Gastro-esophageal reflux disease without esophagitis: Secondary | ICD-10-CM | POA: Diagnosis not present

## 2022-01-28 DIAGNOSIS — R109 Unspecified abdominal pain: Secondary | ICD-10-CM | POA: Diagnosis not present

## 2022-01-28 DIAGNOSIS — K5900 Constipation, unspecified: Secondary | ICD-10-CM | POA: Diagnosis not present

## 2022-01-29 DIAGNOSIS — N2581 Secondary hyperparathyroidism of renal origin: Secondary | ICD-10-CM | POA: Diagnosis not present

## 2022-01-29 DIAGNOSIS — N186 End stage renal disease: Secondary | ICD-10-CM | POA: Diagnosis not present

## 2022-01-29 DIAGNOSIS — Z992 Dependence on renal dialysis: Secondary | ICD-10-CM | POA: Diagnosis not present

## 2022-02-01 DIAGNOSIS — N186 End stage renal disease: Secondary | ICD-10-CM | POA: Diagnosis not present

## 2022-02-01 DIAGNOSIS — Z992 Dependence on renal dialysis: Secondary | ICD-10-CM | POA: Diagnosis not present

## 2022-02-01 DIAGNOSIS — N2581 Secondary hyperparathyroidism of renal origin: Secondary | ICD-10-CM | POA: Diagnosis not present

## 2022-02-02 ENCOUNTER — Emergency Department: Payer: Medicare HMO

## 2022-02-02 ENCOUNTER — Emergency Department
Admission: EM | Admit: 2022-02-02 | Discharge: 2022-02-02 | Disposition: A | Discharge status: Home / Self Care | Payer: Medicare HMO | Attending: Emergency Medicine | Admitting: Emergency Medicine

## 2022-02-02 ENCOUNTER — Encounter: Payer: Self-pay | Admitting: Emergency Medicine

## 2022-02-02 ENCOUNTER — Other Ambulatory Visit: Payer: Self-pay

## 2022-02-02 DIAGNOSIS — M1612 Unilateral primary osteoarthritis, left hip: Secondary | ICD-10-CM | POA: Diagnosis not present

## 2022-02-02 DIAGNOSIS — I120 Hypertensive chronic kidney disease with stage 5 chronic kidney disease or end stage renal disease: Secondary | ICD-10-CM | POA: Insufficient documentation

## 2022-02-02 DIAGNOSIS — M25552 Pain in left hip: Secondary | ICD-10-CM | POA: Diagnosis not present

## 2022-02-02 DIAGNOSIS — N186 End stage renal disease: Secondary | ICD-10-CM | POA: Insufficient documentation

## 2022-02-02 DIAGNOSIS — R1030 Lower abdominal pain, unspecified: Secondary | ICD-10-CM | POA: Insufficient documentation

## 2022-02-02 DIAGNOSIS — M79605 Pain in left leg: Secondary | ICD-10-CM

## 2022-02-02 DIAGNOSIS — M7989 Other specified soft tissue disorders: Secondary | ICD-10-CM | POA: Diagnosis not present

## 2022-02-02 DIAGNOSIS — J449 Chronic obstructive pulmonary disease, unspecified: Secondary | ICD-10-CM | POA: Diagnosis not present

## 2022-02-02 DIAGNOSIS — Z992 Dependence on renal dialysis: Secondary | ICD-10-CM | POA: Insufficient documentation

## 2022-02-02 DIAGNOSIS — R52 Pain, unspecified: Secondary | ICD-10-CM | POA: Diagnosis not present

## 2022-02-02 DIAGNOSIS — R5381 Other malaise: Secondary | ICD-10-CM | POA: Diagnosis not present

## 2022-02-02 LAB — CBG MONITORING, ED: Glucose-Capillary: 70 mg/dL (ref 70–99)

## 2022-02-02 IMAGING — US US EXTREM LOW VENOUS*L*
1 series · 14 of 24 positions shown · non-contrast
Comparison: [DATE].

CLINICAL DATA: Left lower extremity pain and swelling.

EXAM:
Left LOWER EXTREMITY VENOUS DOPPLER ULTRASOUND
TECHNIQUE: Gray-scale sonography with compression, as well as color and duplex
ultrasound, were performed to evaluate the deep venous system(s)
from the level of the common femoral vein through the popliteal and
proximal calf veins.

[Series 1: us venous img lower uni left (dvt) · portal-venous · 14 of 34 slices shown]
[im 1/34]
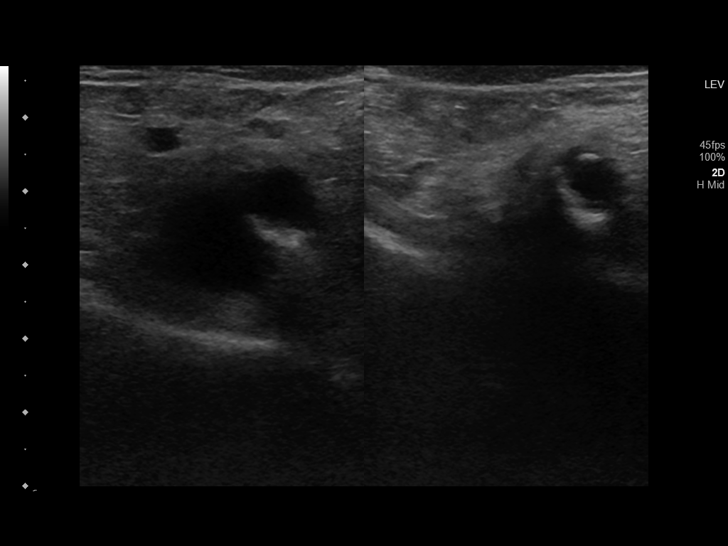
[im 3/34]
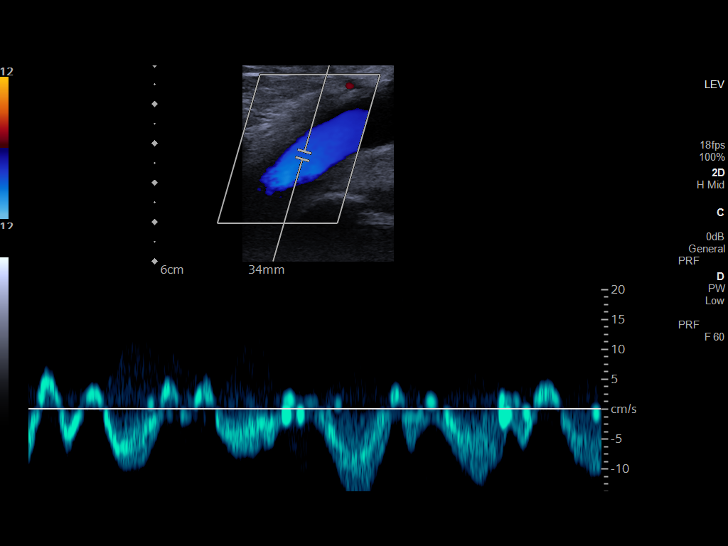
[im 6/34]
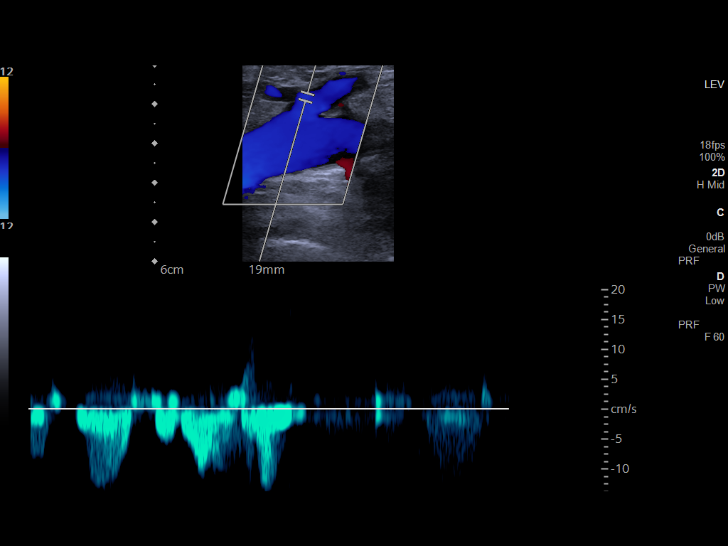
[im 9/34]
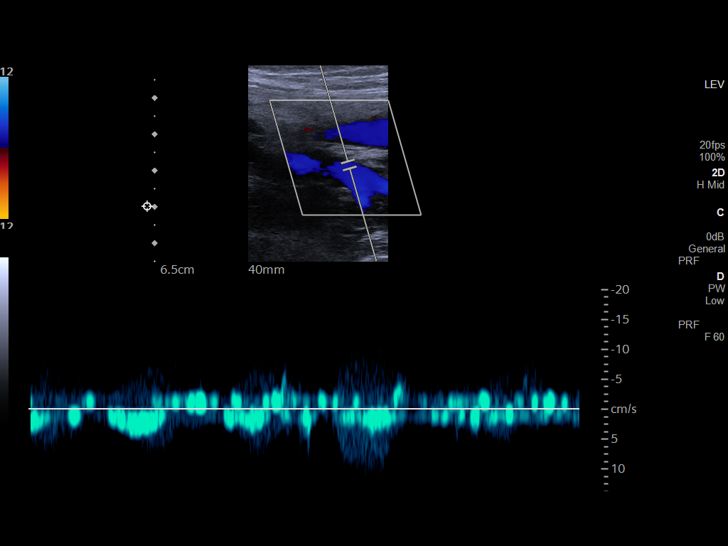
[im 11/34]
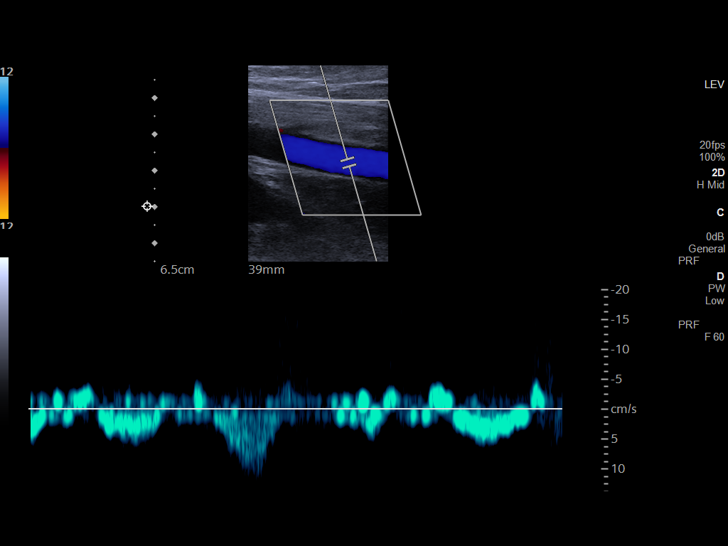
[im 13/34]
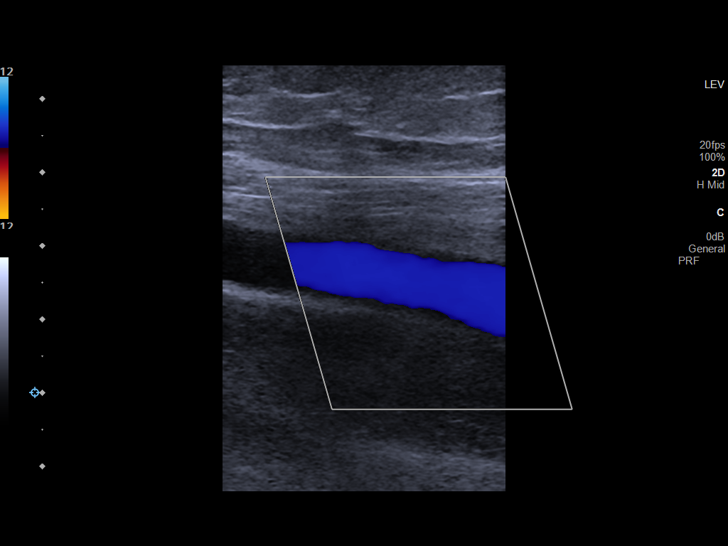
[im 16/34]
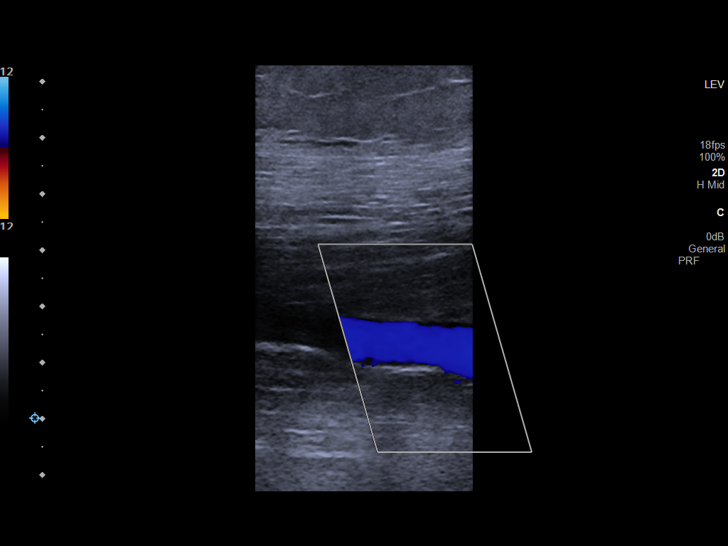
[im 18/34]
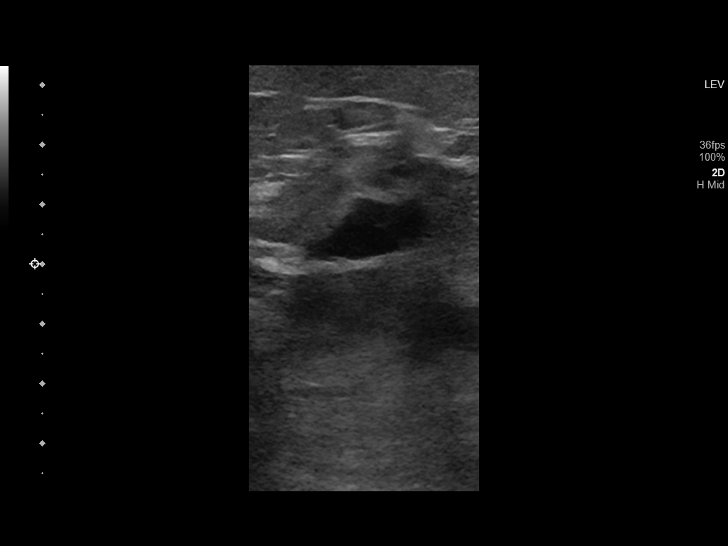
[im 21/34]
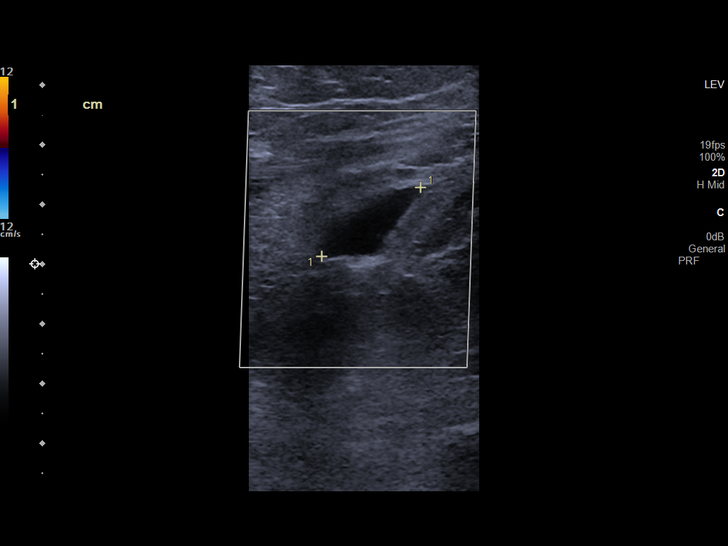
[im 23/34]
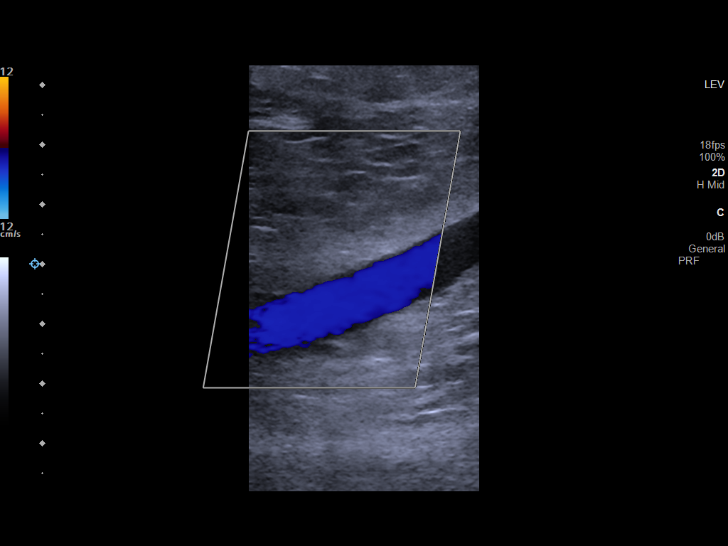
[im 26/34]
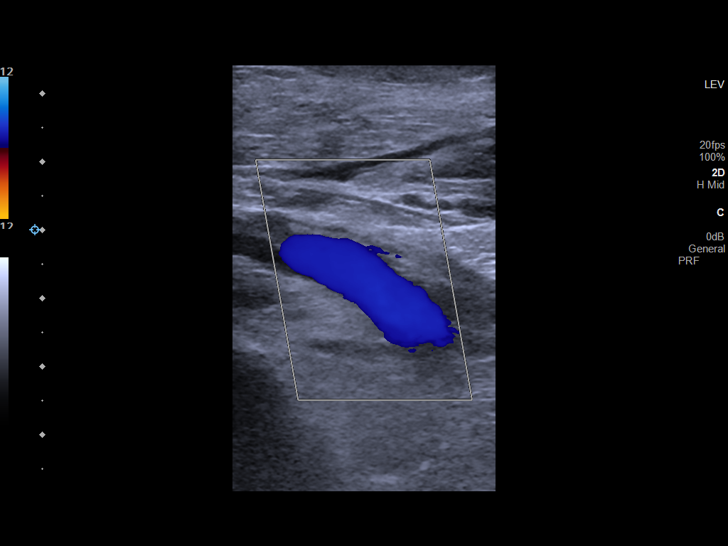
[im 28/34]
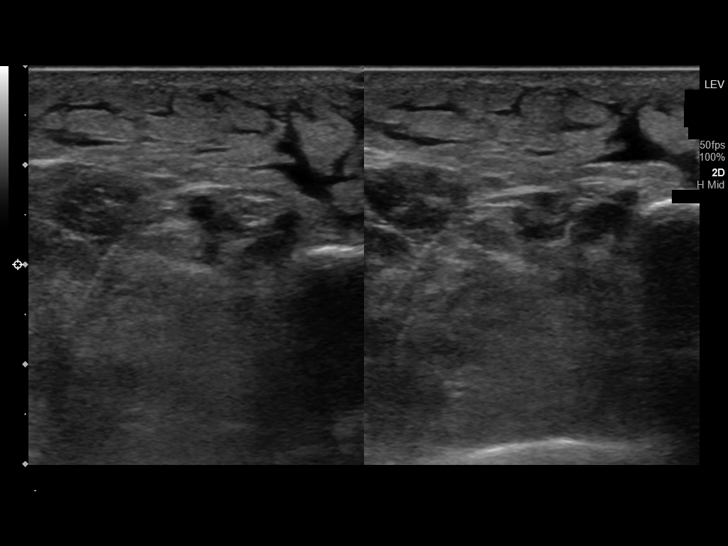
[im 31/34]
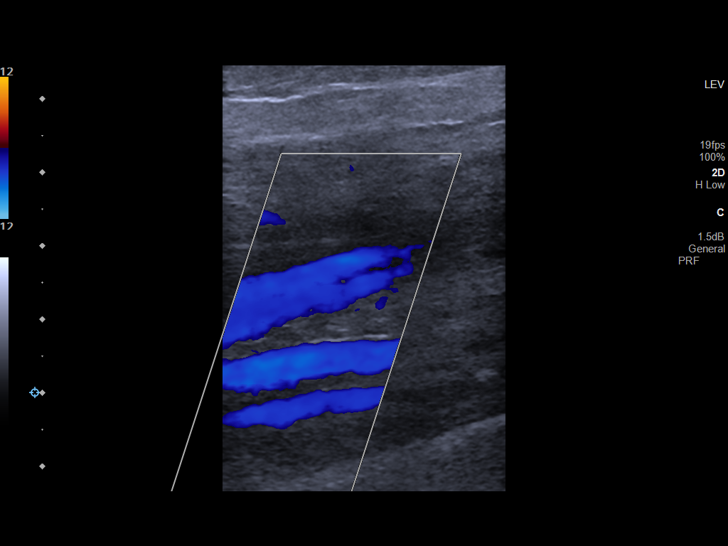
[im 34/34]
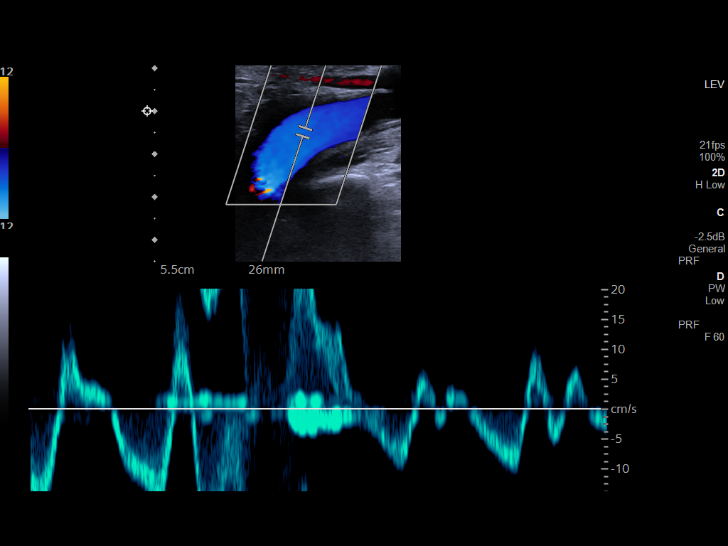

[14 of 24 positions shown; findings below may reference images not displayed]

FINDINGS: VENOUS

Normal compressibility of the common femoral, superficial femoral,
and popliteal veins, as well as the visualized calf veins.
Visualized portions of profunda femoral vein and great saphenous
vein unremarkable. No filling defects to suggest DVT on grayscale or
color Doppler imaging. Doppler waveforms show normal direction of
venous flow, normal respiratory plasticity and response to
augmentation.

Limited views of the contralateral common femoral vein are
unremarkable.

OTHER

Probable Baker's cyst seen in left popliteal fossa.

Limitations: none
IMPRESSION: No evidence of deep venous thrombosis seen in left lower extremity

## 2022-02-02 IMAGING — CR DG HIP (WITH OR WITHOUT PELVIS) 2-3V*L*
1 series · 3 of 3 positions shown · non-contrast
Comparison: CT abdomen and pelvis from [9J].

CLINICAL DATA: History of nontraumatic hip pain by report.

EXAM:
DG HIP (WITH OR WITHOUT PELVIS) 2-3V LEFT

[Series 1: dg hip unilat w or w/o pelvis 2-3 views  · non-contrast · 0.14mm/px · 3 of 3 slices shown]
[im 1/3]
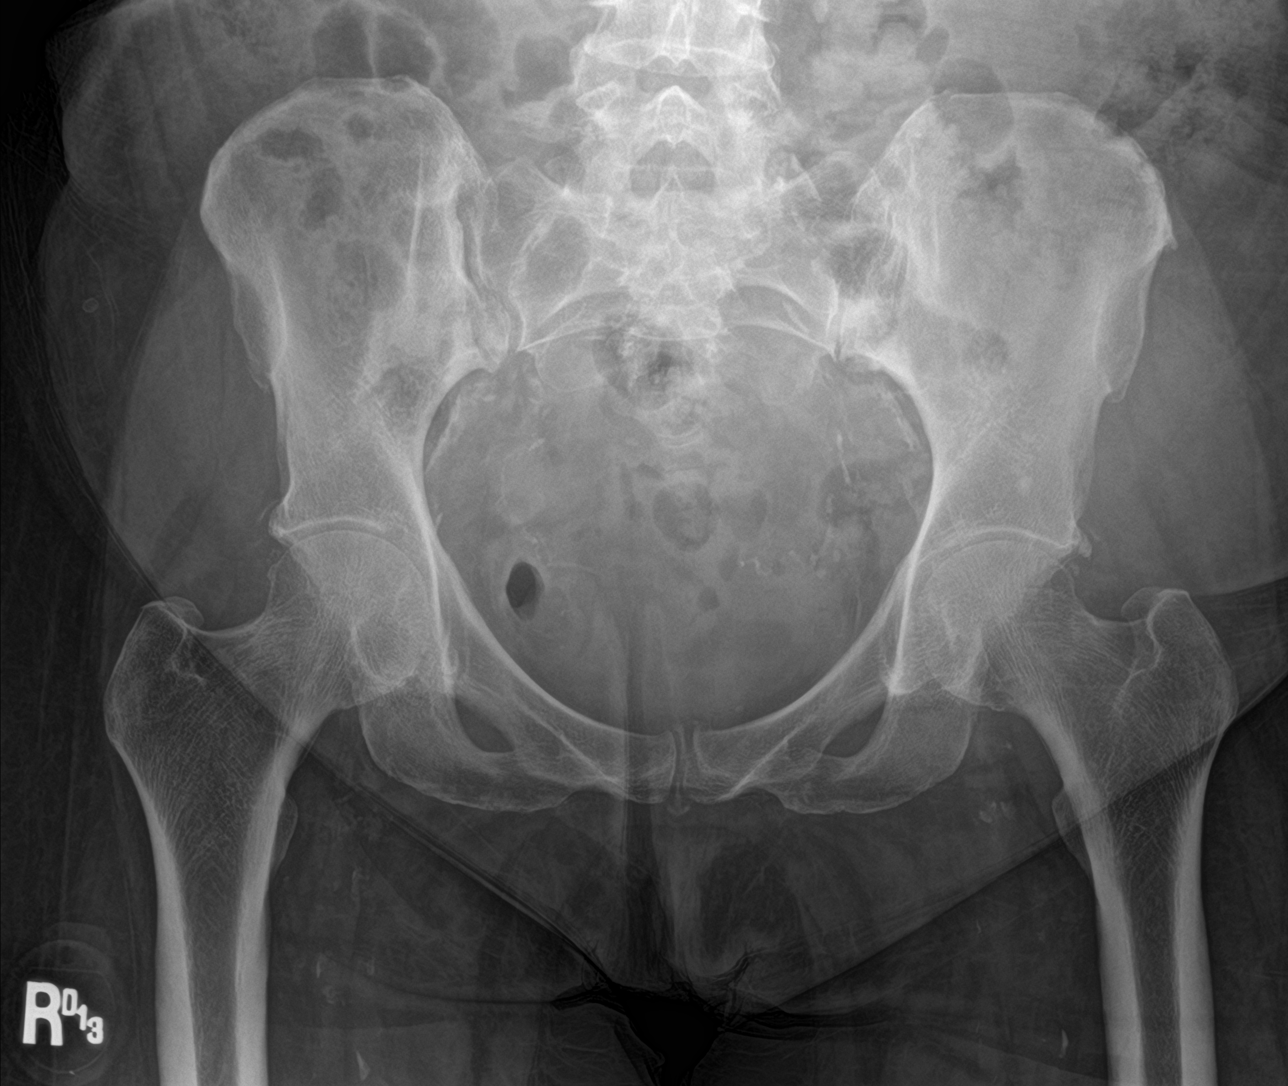
[im 2/3]
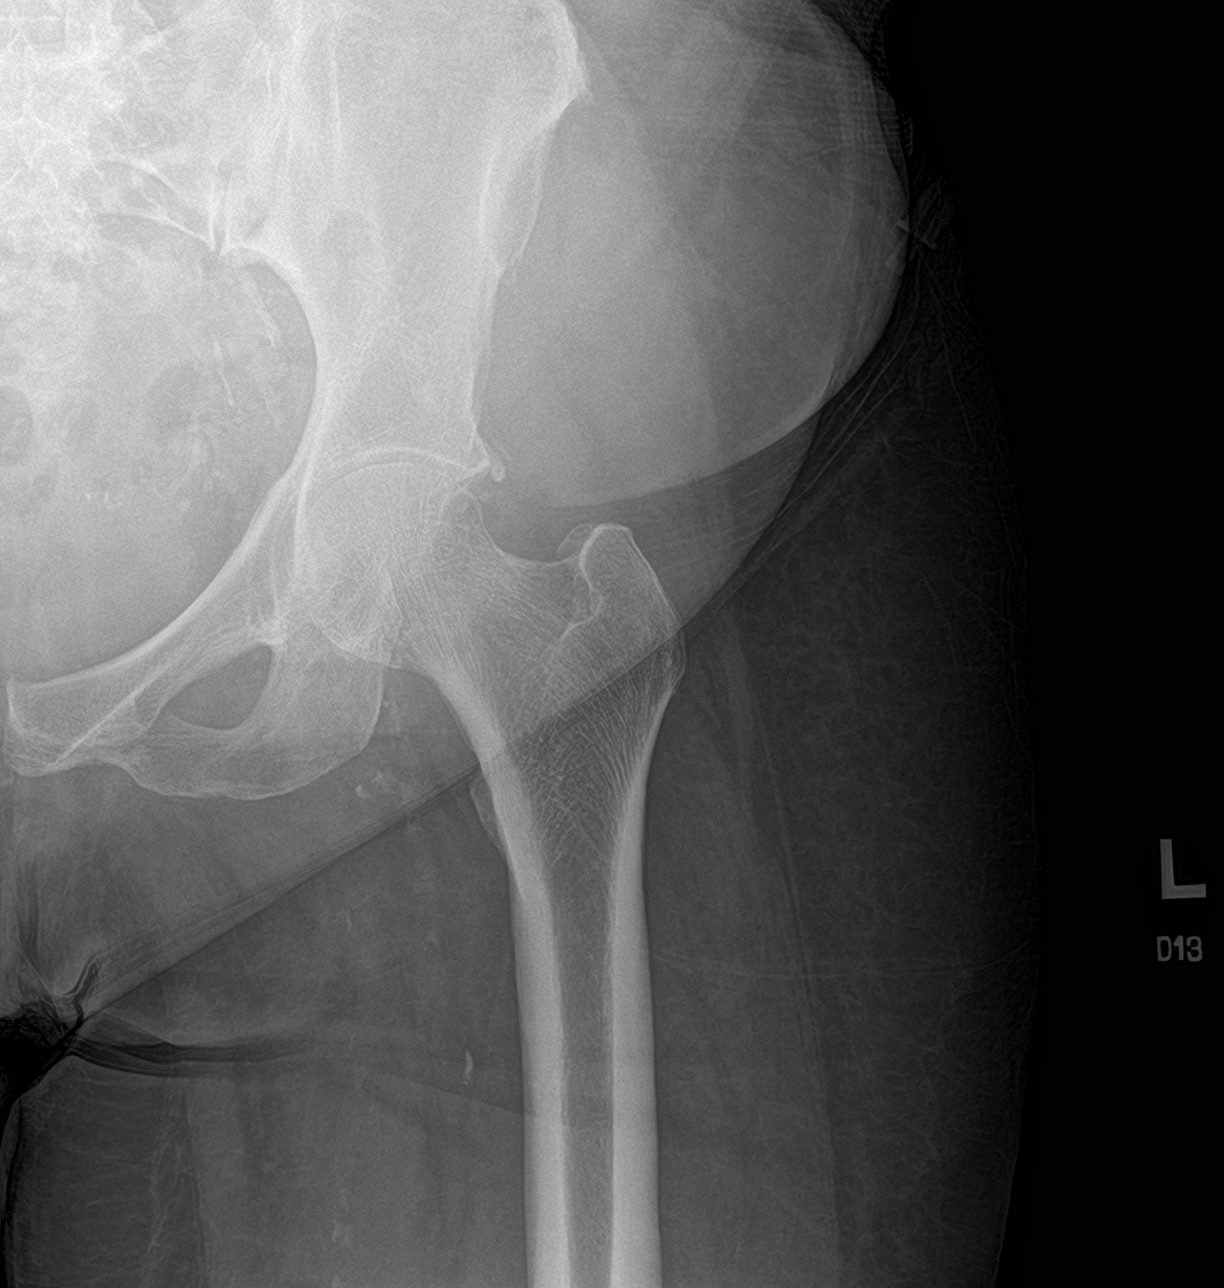
[im 3/3]
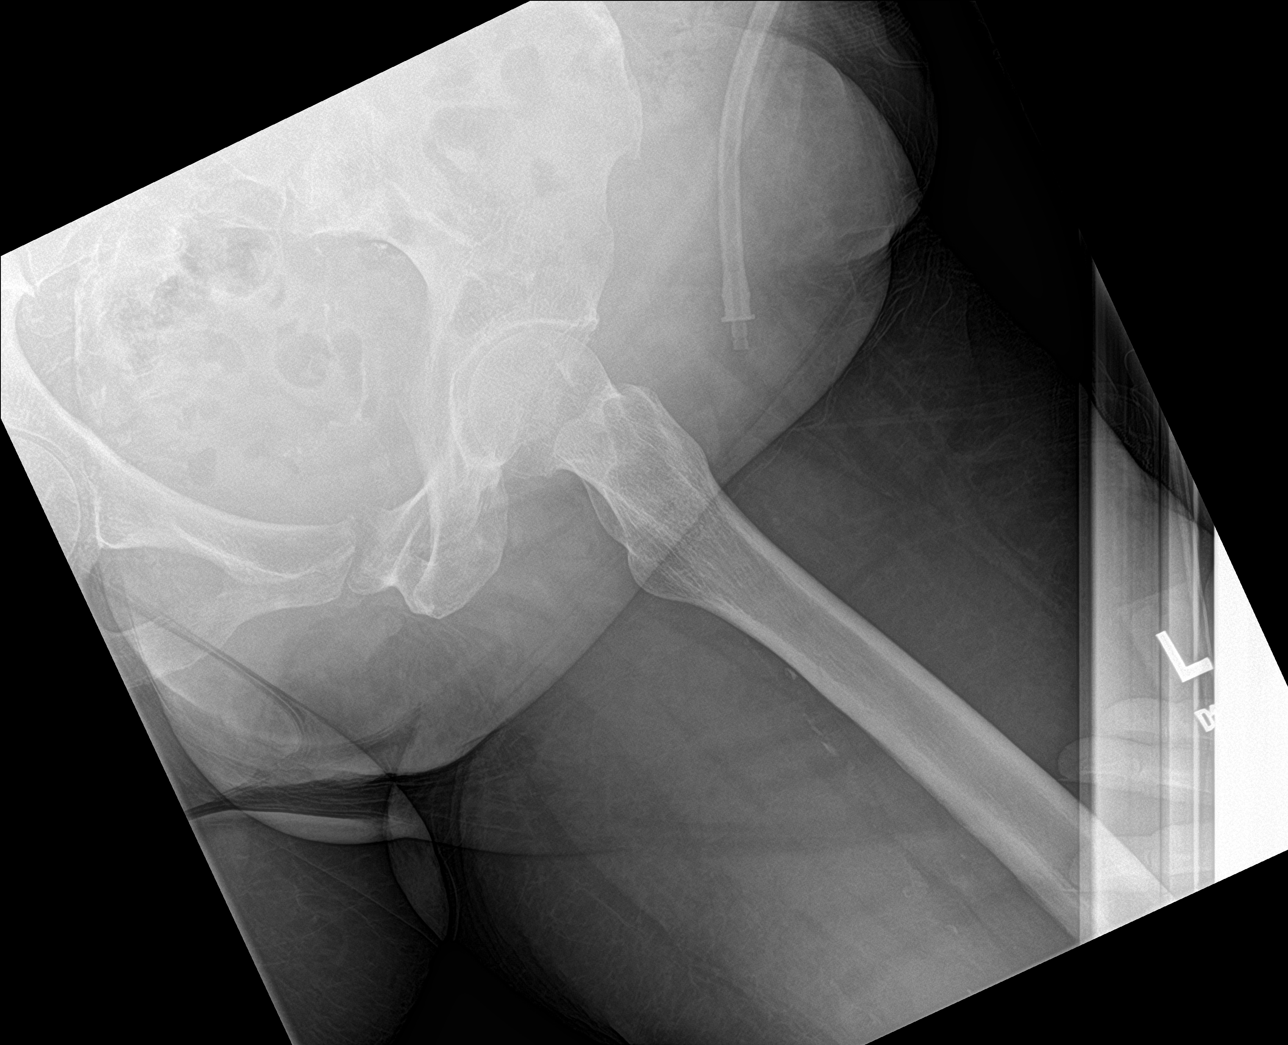

[3 of 3 positions shown; findings below may reference images not displayed]

FINDINGS: Degenerative changes are noted about the bilateral hips. LEFT hip is
located without signs of acute process.

Soft tissues are grossly unremarkable about the pelvis and LEFT hip.
No sign of pelvic fracture or acute bony abnormality.
IMPRESSION: Degenerative changes about the bilateral hips. No acute findings.

## 2022-02-02 MED ORDER — OXYCODONE HCL 5 MG PO TABS
5.0000 mg | ORAL_TABLET | Freq: Once | ORAL | Status: AC
  Administered 2022-02-02: 5 mg via ORAL
  Filled 2022-02-02: qty 1

## 2022-02-02 MED ORDER — METHOCARBAMOL 500 MG PO TABS: 500.0000 mg | ORAL_TABLET | Freq: Three times a day (TID) | ORAL | 0 refills | Status: DC | PRN

## 2022-02-02 NOTE — Discharge Instructions (Addendum)
Follow up with primary care if not improving over the next week.  Return to the ER for symptoms that change or worsen if unable to schedule an appointment.

## 2022-02-02 NOTE — ED Notes (Signed)
Pt given crackers peanut butter and apple juice.

## 2022-02-02 NOTE — ED Triage Notes (Addendum)
Pt to ED via [facility] from home for Left leg pain. Pt has not had any injury to the leg. Pt thinks that they may have pulled too much fluid off yesterday during dialysis. Pt states that the pain is a throbbing pain. Pt is in NAD.  Pt noted to have swelling in the left leg as well.

## 2022-02-02 NOTE — ED Provider Notes (Signed)
[facility] Provider Note    Event Date/Time   First MD Initiated Contact with Patient 02/02/22 1356     (approximate)   History   Leg Pain   HPI  Terry Arroyo is a 61 y.o. female with history of COPD, hypertension, ESRD on dialysis and as listed in EMR presents to the emergency department for treatment and evaluation of non-traumatic left groin/hip pain that started yesterday during dialysis.       Physical Exam   Triage Vital Signs: ED Triage Vitals  Enc Vitals Group     BP 02/02/22 1313 128/71     Pulse Rate 02/02/22 1313 66     Resp 02/02/22 1313 18     Temp 02/02/22 1313 98.3 F (36.8 C)     Temp Source 02/02/22 1313 Oral     SpO2 02/02/22 1313 98 %     Weight 02/02/22 1353 169 lb 1.5 oz (76.7 kg)     Height 02/02/22 1353 5\' 6"  (1.676 m)     Head Circumference --      Peak Flow --      Pain Score 02/02/22 1305 10     Pain Loc --      Pain Edu? --      Excl. in [place]? --     Most recent vital signs: Vitals:   02/02/22 1313  BP: 128/71  Pulse: 66  Resp: 18  Temp: 98.3 F (36.8 C)  SpO2: 98%    General: Awake, no distress.  CV:  Good peripheral perfusion.  Resp:  Normal effort.  Abd:  No distention.  Other:  Pain in left groin and hip with external rotation.   ED Results / Procedures / Treatments   Labs (all labs ordered are listed, but only abnormal results are displayed) Labs Reviewed  CBG MONITORING, ED     EKG  Not indicated   RADIOLOGY  Image and radiology report reviewed by me.  Event of the left hip shows no acute bony abnormality.  Ultrasound of the left lower extremity is negative for DVT.  PROCEDURES:  Critical Care performed: No  Procedures   MEDICATIONS ORDERED IN ED: Medications  oxyCODONE (Oxy IR/ROXICODONE) immediate release tablet 5 mg (5 mg Oral Given 02/02/22 1444)     IMPRESSION / MDM / ASSESSMENT AND PLAN / ED COURSE   I have reviewed the triage note.  Differential  diagnosis includes, but is not limited to, musculoskeletal strain, arthritis, DVT  61 year old female presenting to the emergency department for treatment and evaluation of the left groin and hip pain.  No trauma.  X-ray of the hip and ultrasound of the left lower extremity are both negative for acute concerns.  1 tablet of oxycodone given for pain.  Patient will be discharged home with prescription for Robaxin to be taken 3 times a day if needed.  She is to follow-up with her primary care provider in 1 week if not improving.  ER return precautions were discussed.  Just prior to discharge, patient stated that she felt like her blood sugar was getting low.  CBG shows 70. Patient states that she hasn't eaten today. Nurse is giving her something to eat.  Patient feels better after juice and graham crackers and is ready for discharge.      FINAL CLINICAL IMPRESSION(S) / ED DIAGNOSES   Final diagnoses:  Left leg pain     Rx / DC Orders   ED Discharge Orders  Ordered    methocarbamol (ROBAXIN) 500 MG tablet  Every 8 hours PRN        02/02/22 1449             Note:  This document was prepared using Dragon voice recognition software and may include unintentional dictation errors.   Victorino Dike, FNP 02/02/22 1520    Naaman Plummer, MD 02/02/22 [phone]

## 2022-02-02 NOTE — ED Notes (Signed)
First Nurse Note:  Pt to ED via [facility] from home for leg pain. Pt denies injury. Pt thinks that they pulled too much fluid off yesterday at dialysis. Pt states 10/10 pain in the left leg.

## 2022-02-02 NOTE — ED Notes (Signed)
See triage note  presents with pain to left groin area  which is moving into leg  increased pain with ambulation  denies any injury

## 2022-02-03 ENCOUNTER — Other Ambulatory Visit (INDEPENDENT_AMBULATORY_CARE_PROVIDER_SITE_OTHER): Payer: Self-pay | Admitting: Vascular Surgery

## 2022-02-03 DIAGNOSIS — N186 End stage renal disease: Secondary | ICD-10-CM | POA: Diagnosis not present

## 2022-02-03 DIAGNOSIS — Z992 Dependence on renal dialysis: Secondary | ICD-10-CM | POA: Diagnosis not present

## 2022-02-03 DIAGNOSIS — M79662 Pain in left lower leg: Secondary | ICD-10-CM

## 2022-02-03 DIAGNOSIS — N2581 Secondary hyperparathyroidism of renal origin: Secondary | ICD-10-CM | POA: Diagnosis not present

## 2022-02-03 DIAGNOSIS — M79661 Pain in right lower leg: Secondary | ICD-10-CM

## 2022-02-04 ENCOUNTER — Ambulatory Visit (INDEPENDENT_AMBULATORY_CARE_PROVIDER_SITE_OTHER): Payer: Medicare HMO

## 2022-02-04 ENCOUNTER — Other Ambulatory Visit: Payer: Self-pay

## 2022-02-04 ENCOUNTER — Ambulatory Visit (INDEPENDENT_AMBULATORY_CARE_PROVIDER_SITE_OTHER): Payer: Medicare HMO | Admitting: Vascular Surgery

## 2022-02-04 VITALS — BP 146/85 | HR 67 | Ht 63.0 in | Wt 178.0 lb

## 2022-02-04 DIAGNOSIS — E782 Mixed hyperlipidemia: Secondary | ICD-10-CM | POA: Diagnosis not present

## 2022-02-04 DIAGNOSIS — M79661 Pain in right lower leg: Secondary | ICD-10-CM

## 2022-02-04 DIAGNOSIS — E1169 Type 2 diabetes mellitus with other specified complication: Secondary | ICD-10-CM | POA: Diagnosis not present

## 2022-02-04 DIAGNOSIS — M79662 Pain in left lower leg: Secondary | ICD-10-CM | POA: Diagnosis not present

## 2022-02-04 DIAGNOSIS — I10 Essential (primary) hypertension: Secondary | ICD-10-CM | POA: Diagnosis not present

## 2022-02-04 DIAGNOSIS — E785 Hyperlipidemia, unspecified: Secondary | ICD-10-CM

## 2022-02-04 DIAGNOSIS — I25118 Atherosclerotic heart disease of native coronary artery with other forms of angina pectoris: Secondary | ICD-10-CM

## 2022-02-04 DIAGNOSIS — N186 End stage renal disease: Secondary | ICD-10-CM | POA: Diagnosis not present

## 2022-02-04 DIAGNOSIS — I70219 Atherosclerosis of native arteries of extremities with intermittent claudication, unspecified extremity: Secondary | ICD-10-CM | POA: Insufficient documentation

## 2022-02-04 DIAGNOSIS — M79605 Pain in left leg: Secondary | ICD-10-CM | POA: Diagnosis not present

## 2022-02-04 DIAGNOSIS — I70213 Atherosclerosis of native arteries of extremities with intermittent claudication, bilateral legs: Secondary | ICD-10-CM

## 2022-02-04 MED ORDER — OXYCODONE HCL 5 MG PO TABS: 5.0000 mg | ORAL_TABLET | Freq: Four times a day (QID) | ORAL | 0 refills | Status: DC | PRN

## 2022-02-04 NOTE — Progress Notes (Signed)
MRN : [id number]  Terry Arroyo is a 61 y.o. (12-31-1960) female who presents with chief complaint of check legs.  History of Present Illness:   The patient returns to the office for followup and review of the noninvasive studies. There have been no interval changes in lower extremity symptoms. No interval shortening of the patient's claudication distance or development of rest pain symptoms. No new ulcers or wounds have occurred since the last visit.  The patient is also followed for her av access and is status post intervention of the dialysis access 12/29/2021.   Procedure: Percutaneous transluminal angioplasty and stent placement venous outflow right brachial axillary AV graft  Following the intervention the access function has significantly improved, with better flow rates and improved KT/V. The patient has not been experiencing increased bleeding times following decannulation and the patient denies increased recirculation. The patient denies an increase in arm swelling. At the present time the patient denies hand pain.  There have been no significant changes to the patient's overall health care.  The patient denies amaurosis fugax or recent TIA symptoms. There are no recent neurological changes noted. The patient denies history of DVT, PE or superficial thrombophlebitis. The patient denies recent episodes of angina or shortness of breath.   ABI Rt=1.06 and Lt=0.97  (previous ABI's Rt=1.11 and Lt=1.07) Duplex ultrasound of the left lower extremity shows patent arterial system without hemodynamically significant stenosis.   No outpatient medications have been marked as taking for the 02/04/22 encounter (Appointment) with Delana Meyer, [name] Lory, MD.    Past Medical History:  Diagnosis Date   Anemia    Angina at rest [facility])    Angioedema 03/20/2021   Lisinopril   Anterior cerebral aneurysm    approximately 5 mm; left MCA    Anxiety    Aortic atherosclerosis (HCC)    Asthma     Blind left eye    COPD (chronic obstructive pulmonary disease) (HCC)    Coronary artery disease    Depression    DOE (dyspnea on exertion)    ESRD (end stage renal disease) ([place])    Gait instability    Genital herpes    GERD (gastroesophageal reflux disease)    HFrEF (heart failure with reduced ejection fraction) ([place])    History of 2019 novel coronavirus disease (COVID-19) 03/2019   [place]   History of DVT (deep vein thrombosis)    History of DVT (deep vein thrombosis)    Hx of CABG 2018   x 4; performed in [place]   Hyperlipidemia    Hypertension    MI (myocardial infarction) ([place]) 2018   no stents   Pituitary adenoma ([place])    PVD (posterior vitreous detachment), right eye    Secondary hyperparathyroidism ([place])    Sleep apnea    non-compliant with nocturnal PAP therapy   Stroke [facility]) 2014   stated affected left side.   T2DM (type 2 diabetes mellitus) [facility])     Past Surgical History:  Procedure Laterality Date   A/V FISTULAGRAM Right 12/29/2021   Procedure: A/V FISTULAGRAM;  Surgeon: Katha Cabal, MD;  Location: [place] CV LAB;  Service: Cardiovascular;  Laterality: Right;   AV FISTULA PLACEMENT Right 01/28/2021   Procedure: ARTERIOVENOUS (AV) FISTULA CREATION;  Surgeon: Katha Cabal, MD;  Location: [facility] ORS;  Service: Vascular;  Laterality: Right;   AV FISTULA PLACEMENT Right 05/13/2021   Procedure: INSERTION OF ARTERIOVENOUS (AV) GORE-TEX GRAFT ARM ( BRACHIAL AXILLARY );  Surgeon: Katha Cabal, MD;  Location: [facility] ORS;  Service: Vascular;  Laterality: Right;   [place]   pituitary tumor   BREAST EXCISIONAL BIOPSY Left yrs ago   benign   CESAREAN SECTION     CORONARY ARTERY BYPASS GRAFT  2018   x 4 vessels; performed in [place] N/A 11/07/2020   Procedure: DIALYSIS/PERMA CATHETER INSERTION;  Surgeon: Algernon Huxley, MD;  Location: [place] [facility] LAB;  Service:  Cardiovascular;  Laterality: N/A;   DIALYSIS/PERMA CATHETER REMOVAL N/A 06/12/2021   Procedure: DIALYSIS/PERMA CATHETER REMOVAL;  Surgeon: Algernon Huxley, MD;  Location: [place] [facility];  Service: Cardiovascular;  Laterality: N/A;   ESOPHAGOGASTRODUODENOSCOPY (EGD) WITH PROPOFOL N/A 12/22/2021   Procedure: ESOPHAGOGASTRODUODENOSCOPY (EGD) WITH PROPOFOL;  Surgeon: Lesly Rubenstein, MD;  Location: [facility] ENDOSCOPY;  Service: Gastroenterology;  Laterality: N/A;  IDDM   EYE SURGERY Right 2021   cataracts    PERIPHERAL VASCULAR THROMBECTOMY Right 04/03/2021   Procedure: A/V Fistulagram ;  Surgeon: Katha Cabal, MD;  Location: [place] [facility];  Service: Cardiovascular;  Laterality: Right;    Social History Social History   Tobacco Use   Smoking status: Former    Types: Cigarettes    Quit date: 2018    Years since quitting: 5.1   Smokeless tobacco: Never  Vaping Use   Vaping Use: Never used  Substance Use Topics   Alcohol use: Not Currently   Drug use: Not Currently    Family History Family History  Problem Relation Age of Onset   CAD Mother    CAD Brother    Breast cancer Sister 71    Allergies  Allergen Reactions   Hydralazine Hives, Shortness Of Breath, Swelling and Rash    Body aches    Kiwi Extract Hives, Swelling and Rash    Mouth swells    Lisinopril Swelling    Angioedema   Shellfish Allergy Anaphylaxis    Shrimp/lobster  Betadine leaves welts on skin     Betadine [Povidone Iodine] Hives and Rash    LEAVES WELTS ON SKIN   Metformin Diarrhea and Other (See Comments)    GI Intolerance   Tape Itching    Use paper tape whenever possible     REVIEW OF SYSTEMS (Negative unless checked)  Constitutional: [] Weight loss  [] Fever  [] Chills Cardiac: [] Chest pain   [] Chest pressure   [] Palpitations   [] Shortness of breath when laying flat   [] Shortness of breath with exertion. Vascular:  [x] Pain in legs with walking   [x] Pain in legs at rest  [] History of  DVT   [] Phlebitis   [] Swelling in legs   [] Varicose veins   [] Non-healing ulcers Pulmonary:   [] Uses home oxygen   [] Productive cough   [] Hemoptysis   [] Wheeze  [] COPD   [] Asthma Neurologic:  [] Dizziness   [] Seizures   [] History of stroke   [] History of TIA  [] Aphasia   [] Vissual changes   [] Weakness or numbness in arm   [] Weakness or numbness in leg Musculoskeletal:   [] Joint swelling   [x] Joint pain   [x] Low back pain Hematologic:  [] Easy bruising  [] Easy bleeding   [] Hypercoagulable state   [] Anemic Gastrointestinal:  [] Diarrhea   [] Vomiting  [] Gastroesophageal reflux/heartburn   [] Difficulty swallowing. Genitourinary:  [] Chronic kidney disease   [] Difficult urination  [] Frequent urination   [] Blood in urine Skin:  [] Rashes   [] Ulcers  Psychological:  [] History of anxiety   []  History  of major depression.  Physical Examination  There were no vitals filed for this visit. There is no height or weight on file to calculate BMI. Gen: WD/WN, NAD Head: [place]/AT, No temporalis wasting.  Ear/Nose/Throat: Hearing grossly intact, nares w/o erythema or drainage Eyes: PER, EOMI, sclera nonicteric.  Neck: Supple, no masses.  No bruit or JVD.  Pulmonary:  Good air movement, no audible wheezing, no use of accessory muscles.  Cardiac: RRR, normal S1, S2, no Murmurs. Vascular:  left leg with 1+ edema soft pitting, leg is warm to touch. Vessel Right Left  Radial Palpable Palpable  PT Trace Palpable Not Palpable  DP Not Palpable Trace Palpable  Gastrointestinal: soft, non-distended. No guarding/no peritoneal signs.  Musculoskeletal: M/S 5/5 throughout.  No visible deformity.  Neurologic: CN 2-12 intact. Pain and light touch intact in extremities.  Symmetrical.  Speech is fluent. Motor exam as listed above. Psychiatric: Judgment intact, Mood & affect appropriate for pt's clinical situation. Dermatologic: No rashes or ulcers noted.  No changes consistent with cellulitis.   CBC Lab Results  Component  Value Date   WBC 7.3 11/03/2021   HGB 12.4 11/03/2021   HCT 38.0 11/03/2021   MCV 87.2 11/03/2021   PLT 229 11/03/2021    BMET    Component Value Date/Time   NA 139 11/03/2021 1345   NA 134 (L) 11/18/2013 0138   K 4.2 12/22/2021 0750   K 3.8 11/18/2013 0138   CL 101 11/03/2021 1345   CL 98 11/18/2013 0138   CO2 28 11/03/2021 1345   CO2 29 11/18/2013 0138   GLUCOSE 237 (H) 11/03/2021 1345   GLUCOSE 348 (H) 11/18/2013 0138   BUN 40 (H) 11/03/2021 1345   BUN 11 11/18/2013 0138   CREATININE 5.43 (H) 11/03/2021 1345   CREATININE 0.87 11/18/2013 0138   CALCIUM 8.3 (L) 11/03/2021 1345   CALCIUM 9.4 11/18/2013 0138   GFRNONAA 9 (L) 11/03/2021 1345   GFRNONAA >60 11/18/2013 0138   GFRAA 18 (L) 09/15/2020 0403   GFRAA >60 11/18/2013 0138   CrCl cannot be calculated (Patient's most recent lab result is older than the maximum 21 days allowed.).  COAG Lab Results  Component Value Date   INR 1.0 05/07/2021   INR 1.1 01/20/2021   INR 1.0 09/11/2020    Radiology US Venous Img Lower Unilateral Left  Result Date: 02/02/2022 CLINICAL DATA:  Left lower extremity pain and swelling. EXAM: Left LOWER EXTREMITY VENOUS DOPPLER ULTRASOUND TECHNIQUE: Gray-scale sonography with compression, as well as color and duplex ultrasound, were performed to evaluate the deep venous system(s) from the level of the common femoral vein through the popliteal and proximal calf veins. COMPARISON:  May 01, 2020. FINDINGS: VENOUS Normal compressibility of the common femoral, superficial femoral, and popliteal veins, as well as the visualized calf veins. Visualized portions of profunda femoral vein and great saphenous vein unremarkable. No filling defects to suggest DVT on grayscale or color Doppler imaging. Doppler waveforms show normal direction of venous flow, normal respiratory plasticity and response to augmentation. Limited views of the contralateral common femoral vein are unremarkable. OTHER Probable Baker's  cyst seen in left popliteal fossa. Limitations: none IMPRESSION: No evidence of deep venous thrombosis seen in left lower extremity Electronically Signed   By: Marijo Conception M.D.   On: 02/02/2022 13:48   DG Hip Unilat W or Wo Pelvis 2-3 Views Left  Result Date: 02/02/2022 CLINICAL DATA:  History of nontraumatic hip pain by report. EXAM: DG HIP (WITH OR  WITHOUT PELVIS) 2-3V LEFT COMPARISON:  CT abdomen and pelvis from 2022. FINDINGS: Degenerative changes are noted about the bilateral hips. LEFT hip is located without signs of acute process. Soft tissues are grossly unremarkable about the pelvis and LEFT hip. No sign of pelvic fracture or acute bony abnormality. IMPRESSION: Electronically Signed   By: Zetta Bills M.D.   On: 02/02/2022 14:38     Assessment/Plan 1. Atherosclerosis of native artery of both lower extremities with intermittent claudication (HCC)  Recommend:  The patient has evidence of stable atherosclerosis of the lower extremities with claudication.  The patient does not voice lifestyle limiting changes that are consistent with vascular etiology at this point in time.  I believe her leg pain is related to LS spine disease and have sent a referral to her primary care.  Noninvasive studies do not suggest clinically significant change.  No invasive studies, angiography or surgery at this time The patient should continue walking and begin a more formal exercise program.  The patient should continue antiplatelet therapy and aggressive treatment of the lipid abnormalities  No changes in the patient's medications at this time  The patient should continue wearing graduated compression socks 10-15 mmHg strength to control the mild edema.   - VAS [place] ABI WITH/WO TBI; Future  2. Leg pain, anterior, left The patient has evidence of stable atherosclerosis of the lower extremities with claudication.  The patient does not voice lifestyle limiting changes that are consistent with vascular  etiology at this point in time.  I believe her leg pain is related to LS spine disease and have sent a referral to her primary care. - Ambulatory referral to Internal Medicine  3. ESRD (end stage renal disease) ([place]) Recommend:  The patient is doing well and currently has adequate dialysis access. The patient's dialysis center is not reporting any access issues. Flow pattern is stable when compared to the prior ultrasound.  The patient should have a duplex ultrasound of the dialysis access in 6 months.  The patient will follow-up with me in the office after each ultrasound    - VAS [place] (AVF, AVG); Future  4. Essential hypertension Continue antihypertensive medications as already ordered, these medications have been reviewed and there are no changes at this time.   5. Coronary artery disease of native artery of native heart with stable angina pectoris ([place]) Continue cardiac and antihypertensive medications as already ordered and reviewed, no changes at this time.  Continue statin as ordered and reviewed, no changes at this time  Nitrates PRN for chest pain   6. Type 2 diabetes mellitus with hyperlipidemia (HCC) Continue hypoglycemic medications as already ordered, these medications have been reviewed and there are no changes at this time.  Hgb A1C to be monitored as already arranged by primary service   7. Mixed hyperlipidemia Continue statin as ordered and reviewed, no changes at this time     [name], MD  02/04/2022 8:20 AM

## 2022-02-05 DIAGNOSIS — Z992 Dependence on renal dialysis: Secondary | ICD-10-CM | POA: Diagnosis not present

## 2022-02-05 DIAGNOSIS — N186 End stage renal disease: Secondary | ICD-10-CM | POA: Diagnosis not present

## 2022-02-05 DIAGNOSIS — N2581 Secondary hyperparathyroidism of renal origin: Secondary | ICD-10-CM | POA: Diagnosis not present

## 2022-02-06 ENCOUNTER — Encounter (INDEPENDENT_AMBULATORY_CARE_PROVIDER_SITE_OTHER): Payer: Self-pay | Admitting: Vascular Surgery

## 2022-02-08 DIAGNOSIS — N186 End stage renal disease: Secondary | ICD-10-CM | POA: Diagnosis not present

## 2022-02-08 DIAGNOSIS — Z992 Dependence on renal dialysis: Secondary | ICD-10-CM | POA: Diagnosis not present

## 2022-02-08 DIAGNOSIS — N2581 Secondary hyperparathyroidism of renal origin: Secondary | ICD-10-CM | POA: Diagnosis not present

## 2022-02-09 ENCOUNTER — Emergency Department: Payer: Medicare HMO

## 2022-02-09 ENCOUNTER — Other Ambulatory Visit: Payer: Self-pay

## 2022-02-09 ENCOUNTER — Observation Stay: Payer: Medicare HMO

## 2022-02-09 ENCOUNTER — Inpatient Hospital Stay
Admission: EM | Admit: 2022-02-09 | Discharge: 2022-02-16 | DRG: 602 | Disposition: A | Discharge status: Skilled Nursing Facility | Payer: Medicare HMO | Attending: Internal Medicine | Admitting: Internal Medicine

## 2022-02-09 DIAGNOSIS — Z91013 Allergy to seafood: Secondary | ICD-10-CM | POA: Diagnosis not present

## 2022-02-09 DIAGNOSIS — R5381 Other malaise: Secondary | ICD-10-CM

## 2022-02-09 DIAGNOSIS — R109 Unspecified abdominal pain: Secondary | ICD-10-CM | POA: Diagnosis not present

## 2022-02-09 DIAGNOSIS — Z79899 Other long term (current) drug therapy: Secondary | ICD-10-CM

## 2022-02-09 DIAGNOSIS — Z91199 Patient's noncompliance with other medical treatment and regimen due to unspecified reason: Secondary | ICD-10-CM

## 2022-02-09 DIAGNOSIS — G8929 Other chronic pain: Secondary | ICD-10-CM | POA: Diagnosis present

## 2022-02-09 DIAGNOSIS — Z91048 Other nonmedicinal substance allergy status: Secondary | ICD-10-CM

## 2022-02-09 DIAGNOSIS — E1169 Type 2 diabetes mellitus with other specified complication: Secondary | ICD-10-CM | POA: Diagnosis present

## 2022-02-09 DIAGNOSIS — I517 Cardiomegaly: Secondary | ICD-10-CM | POA: Diagnosis not present

## 2022-02-09 DIAGNOSIS — E1122 Type 2 diabetes mellitus with diabetic chronic kidney disease: Secondary | ICD-10-CM | POA: Diagnosis present

## 2022-02-09 DIAGNOSIS — E1151 Type 2 diabetes mellitus with diabetic peripheral angiopathy without gangrene: Secondary | ICD-10-CM | POA: Diagnosis present

## 2022-02-09 DIAGNOSIS — Z86718 Personal history of other venous thrombosis and embolism: Secondary | ICD-10-CM

## 2022-02-09 DIAGNOSIS — Z8249 Family history of ischemic heart disease and other diseases of the circulatory system: Secondary | ICD-10-CM

## 2022-02-09 DIAGNOSIS — N2581 Secondary hyperparathyroidism of renal origin: Secondary | ICD-10-CM | POA: Diagnosis present

## 2022-02-09 DIAGNOSIS — F419 Anxiety disorder, unspecified: Secondary | ICD-10-CM | POA: Diagnosis present

## 2022-02-09 DIAGNOSIS — Z794 Long term (current) use of insulin: Secondary | ICD-10-CM

## 2022-02-09 DIAGNOSIS — H43811 Vitreous degeneration, right eye: Secondary | ICD-10-CM | POA: Diagnosis present

## 2022-02-09 DIAGNOSIS — E1142 Type 2 diabetes mellitus with diabetic polyneuropathy: Secondary | ICD-10-CM | POA: Diagnosis present

## 2022-02-09 DIAGNOSIS — M1712 Unilateral primary osteoarthritis, left knee: Secondary | ICD-10-CM | POA: Diagnosis not present

## 2022-02-09 DIAGNOSIS — J449 Chronic obstructive pulmonary disease, unspecified: Secondary | ICD-10-CM | POA: Diagnosis present

## 2022-02-09 DIAGNOSIS — Z6831 Body mass index (BMI) 31.0-31.9, adult: Secondary | ICD-10-CM

## 2022-02-09 DIAGNOSIS — Z951 Presence of aortocoronary bypass graft: Secondary | ICD-10-CM | POA: Diagnosis not present

## 2022-02-09 DIAGNOSIS — L02416 Cutaneous abscess of left lower limb: Secondary | ICD-10-CM

## 2022-02-09 DIAGNOSIS — I132 Hypertensive heart and chronic kidney disease with heart failure and with stage 5 chronic kidney disease, or end stage renal disease: Secondary | ICD-10-CM | POA: Diagnosis present

## 2022-02-09 DIAGNOSIS — L03116 Cellulitis of left lower limb: Principal | ICD-10-CM | POA: Diagnosis present

## 2022-02-09 DIAGNOSIS — I5020 Unspecified systolic (congestive) heart failure: Secondary | ICD-10-CM | POA: Diagnosis present

## 2022-02-09 DIAGNOSIS — Z992 Dependence on renal dialysis: Secondary | ICD-10-CM

## 2022-02-09 DIAGNOSIS — I2510 Atherosclerotic heart disease of native coronary artery without angina pectoris: Secondary | ICD-10-CM | POA: Diagnosis present

## 2022-02-09 DIAGNOSIS — Z20822 Contact with and (suspected) exposure to covid-19: Secondary | ICD-10-CM | POA: Diagnosis present

## 2022-02-09 DIAGNOSIS — E785 Hyperlipidemia, unspecified: Secondary | ICD-10-CM | POA: Diagnosis present

## 2022-02-09 DIAGNOSIS — M25462 Effusion, left knee: Secondary | ICD-10-CM | POA: Diagnosis not present

## 2022-02-09 DIAGNOSIS — H5462 Unqualified visual loss, left eye, normal vision right eye: Secondary | ICD-10-CM | POA: Diagnosis present

## 2022-02-09 DIAGNOSIS — Z8616 Personal history of COVID-19: Secondary | ICD-10-CM | POA: Diagnosis not present

## 2022-02-09 DIAGNOSIS — M79605 Pain in left leg: Secondary | ICD-10-CM | POA: Diagnosis not present

## 2022-02-09 DIAGNOSIS — I700 Atherosclerosis of aorta: Secondary | ICD-10-CM | POA: Diagnosis present

## 2022-02-09 DIAGNOSIS — I10 Essential (primary) hypertension: Secondary | ICD-10-CM | POA: Diagnosis present

## 2022-02-09 DIAGNOSIS — N186 End stage renal disease: Secondary | ICD-10-CM | POA: Diagnosis present

## 2022-02-09 DIAGNOSIS — I120 Hypertensive chronic kidney disease with stage 5 chronic kidney disease or end stage renal disease: Secondary | ICD-10-CM | POA: Diagnosis not present

## 2022-02-09 DIAGNOSIS — K219 Gastro-esophageal reflux disease without esophagitis: Secondary | ICD-10-CM | POA: Diagnosis present

## 2022-02-09 DIAGNOSIS — D631 Anemia in chronic kidney disease: Secondary | ICD-10-CM | POA: Diagnosis present

## 2022-02-09 DIAGNOSIS — I5022 Chronic systolic (congestive) heart failure: Secondary | ICD-10-CM | POA: Diagnosis present

## 2022-02-09 DIAGNOSIS — M533 Sacrococcygeal disorders, not elsewhere classified: Secondary | ICD-10-CM | POA: Diagnosis not present

## 2022-02-09 DIAGNOSIS — Z8673 Personal history of transient ischemic attack (TIA), and cerebral infarction without residual deficits: Secondary | ICD-10-CM

## 2022-02-09 DIAGNOSIS — E669 Obesity, unspecified: Secondary | ICD-10-CM | POA: Diagnosis present

## 2022-02-09 DIAGNOSIS — I5030 Unspecified diastolic (congestive) heart failure: Secondary | ICD-10-CM | POA: Diagnosis not present

## 2022-02-09 DIAGNOSIS — Z7982 Long term (current) use of aspirin: Secondary | ICD-10-CM

## 2022-02-09 DIAGNOSIS — I252 Old myocardial infarction: Secondary | ICD-10-CM

## 2022-02-09 DIAGNOSIS — M25552 Pain in left hip: Secondary | ICD-10-CM | POA: Diagnosis not present

## 2022-02-09 DIAGNOSIS — M79652 Pain in left thigh: Secondary | ICD-10-CM | POA: Diagnosis present

## 2022-02-09 DIAGNOSIS — R600 Localized edema: Secondary | ICD-10-CM | POA: Diagnosis not present

## 2022-02-09 DIAGNOSIS — Z888 Allergy status to other drugs, medicaments and biological substances status: Secondary | ICD-10-CM

## 2022-02-09 DIAGNOSIS — Z87891 Personal history of nicotine dependence: Secondary | ICD-10-CM

## 2022-02-09 DIAGNOSIS — G4730 Sleep apnea, unspecified: Secondary | ICD-10-CM | POA: Diagnosis present

## 2022-02-09 DIAGNOSIS — M1612 Unilateral primary osteoarthritis, left hip: Secondary | ICD-10-CM | POA: Diagnosis not present

## 2022-02-09 DIAGNOSIS — M609 Myositis, unspecified: Secondary | ICD-10-CM | POA: Diagnosis present

## 2022-02-09 DIAGNOSIS — M7989 Other specified soft tissue disorders: Secondary | ICD-10-CM | POA: Diagnosis not present

## 2022-02-09 DIAGNOSIS — K828 Other specified diseases of gallbladder: Secondary | ICD-10-CM | POA: Diagnosis not present

## 2022-02-09 DIAGNOSIS — E871 Hypo-osmolality and hyponatremia: Secondary | ICD-10-CM | POA: Diagnosis not present

## 2022-02-09 HISTORY — DX: Pain in left thigh: M79.652

## 2022-02-09 LAB — COMPREHENSIVE METABOLIC PANEL
ALT: 12 U/L (ref 0–44)
AST: 20 U/L (ref 15–41)
Albumin: 3.7 g/dL (ref 3.5–5.0)
Alkaline Phosphatase: 78 U/L (ref 38–126)
Anion gap: 15 (ref 5–15)
BUN: 33 mg/dL — ABNORMAL HIGH (ref 6–20)
CO2: 27 mmol/L (ref 22–32)
Calcium: 8.8 mg/dL — ABNORMAL LOW (ref 8.9–10.3)
Chloride: 94 mmol/L — ABNORMAL LOW (ref 98–111)
Creatinine, Ser: 6.18 mg/dL — ABNORMAL HIGH (ref 0.44–1.00)
GFR, Estimated: 7 mL/min — ABNORMAL LOW (ref >60)
Glucose, Bld: 104 mg/dL — ABNORMAL HIGH (ref 70–99)
Potassium: 4.5 mmol/L (ref 3.5–5.1)
Sodium: 136 mmol/L (ref 135–145)
Total Bilirubin: 1.7 mg/dL — ABNORMAL HIGH (ref 0.3–1.2)
Total Protein: 8.1 g/dL (ref 6.5–8.1)

## 2022-02-09 LAB — CBC
HCT: 40.6 % (ref 36.0–46.0)
Hemoglobin: 12.5 g/dL (ref 12.0–15.0)
MCH: 28.1 pg (ref 26.0–34.0)
MCHC: 30.8 g/dL (ref 30.0–36.0)
MCV: 91.2 fL (ref 80.0–100.0)
Platelets: 279 10*3/uL (ref 150–400)
RBC: 4.45 MIL/uL (ref 3.87–5.11)
RDW: 15.1 % (ref 11.5–15.5)
WBC: 8.9 10*3/uL (ref 4.0–10.5)
nRBC: 0 % (ref 0.0–0.2)

## 2022-02-09 LAB — LIPASE, BLOOD: Lipase: 25 U/L (ref 11–51)

## 2022-02-09 LAB — CBG MONITORING, ED: Glucose-Capillary: 69 mg/dL — ABNORMAL LOW (ref 70–99)

## 2022-02-09 LAB — SEDIMENTATION RATE: Sed Rate: 49 mm/hr — ABNORMAL HIGH (ref 0–30)

## 2022-02-09 IMAGING — CT CT RENAL STONE PROTOCOL
2 of 4 series · 17 of 46 positions shown, 19 images · non-contrast
Comparison: CT [DATE]

CLINICAL DATA: Flank pain



[Series 2: stone full standard · axial · 0.91mm/px · z∈[-1127,-727]mm · 14 of 88 slices shown, 16 images]
[im 4/88  soft-tissue]
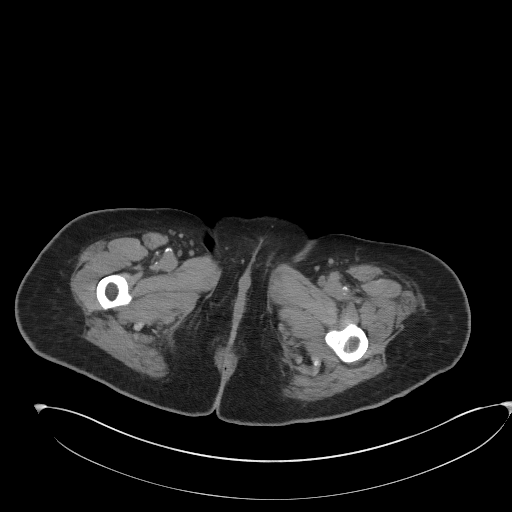
[im 4/88  bone]
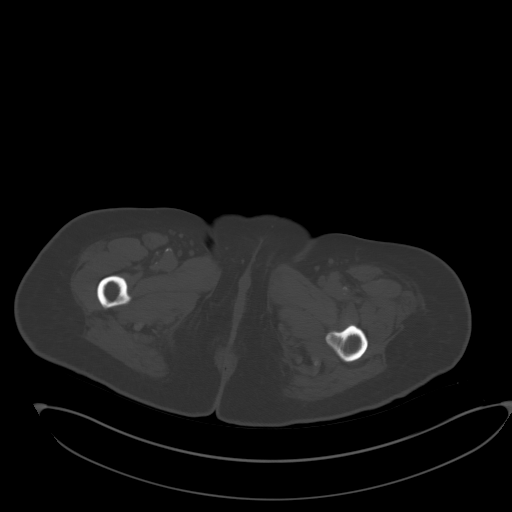
[im 12/88  soft-tissue]
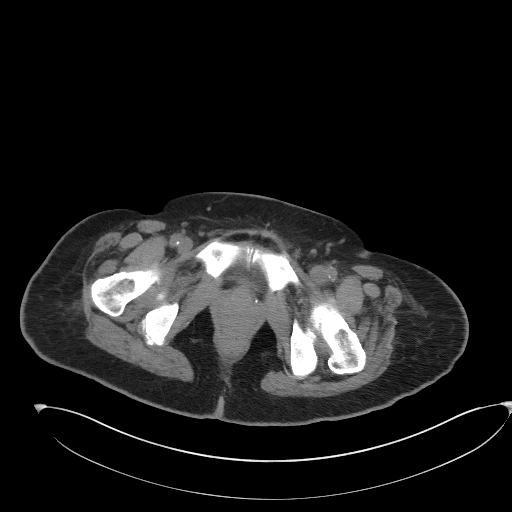
[im 16/88  soft-tissue]
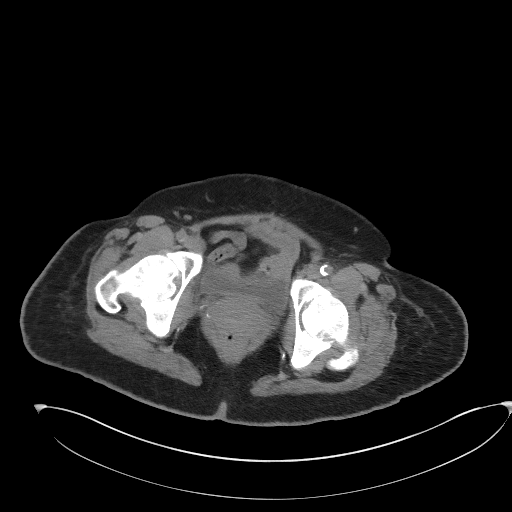
[im 24/88  soft-tissue]
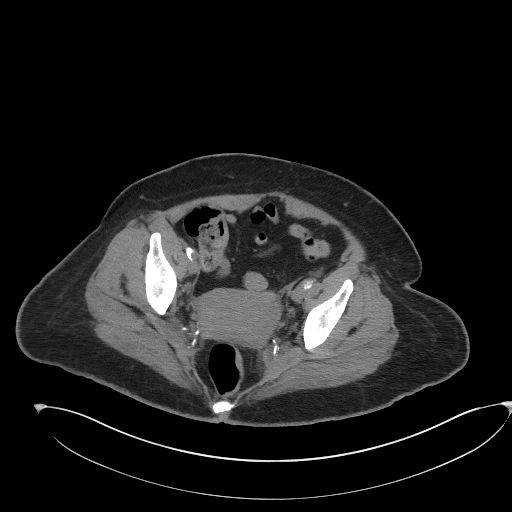
[im 28/88  soft-tissue]
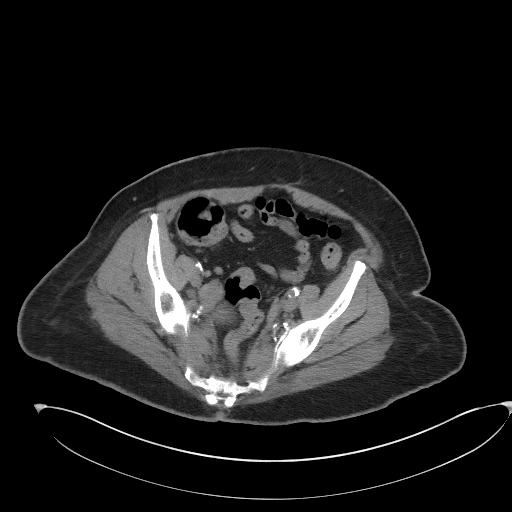
[im 36/88  soft-tissue]
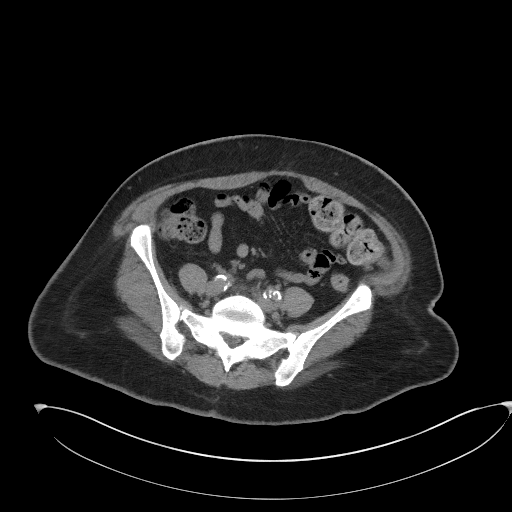
[im 40/88  soft-tissue]
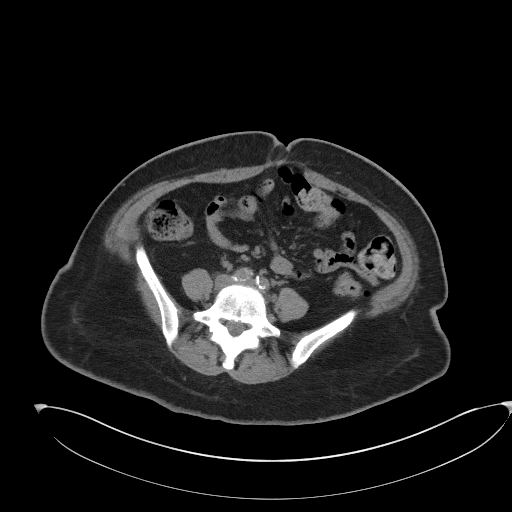
[im 48/88  soft-tissue]
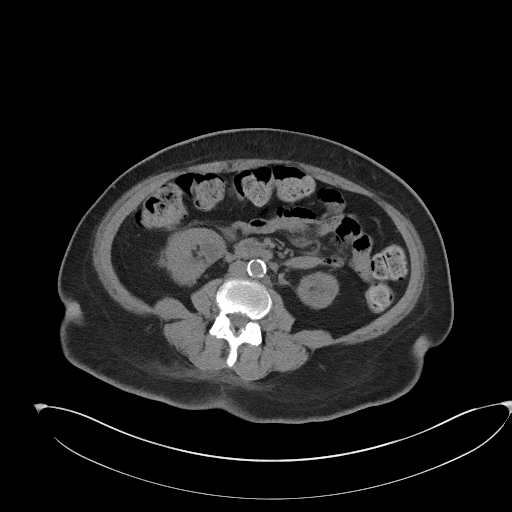
[im 52/88  soft-tissue]
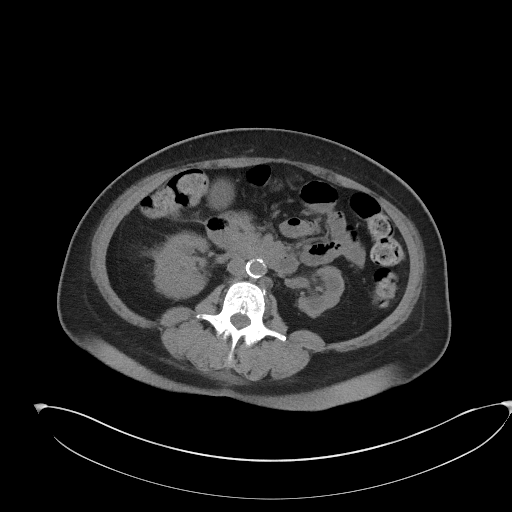
[im 52/88  bone]
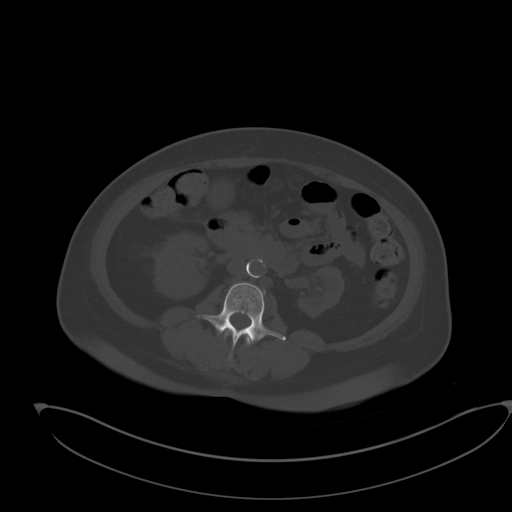
[im 60/88  soft-tissue]
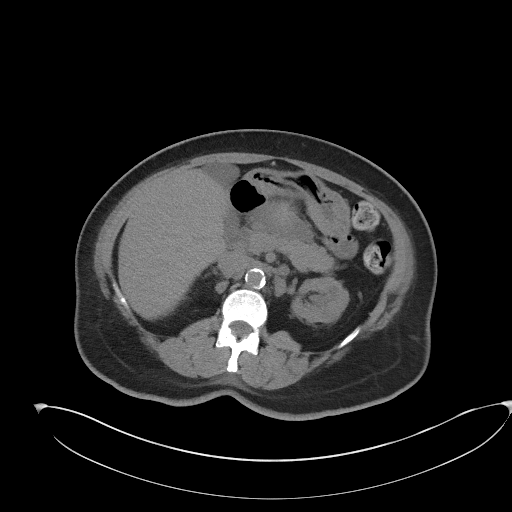
[im 64/88  soft-tissue]
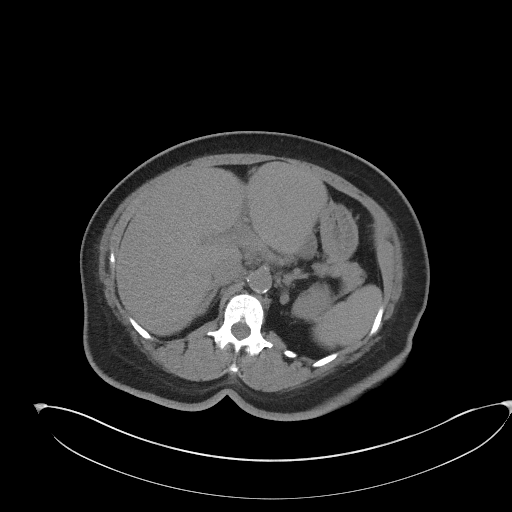
[im 72/88  soft-tissue]
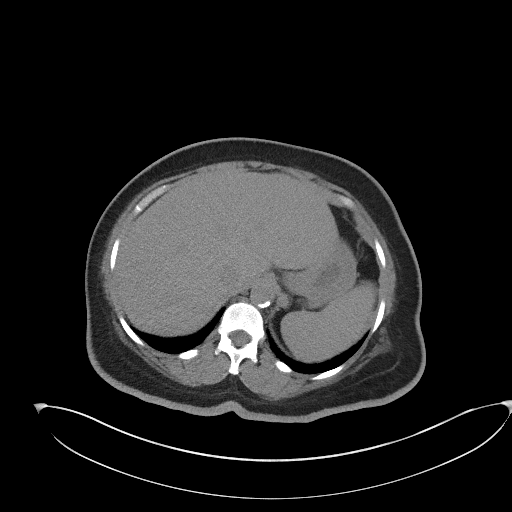
[im 76/88  soft-tissue]
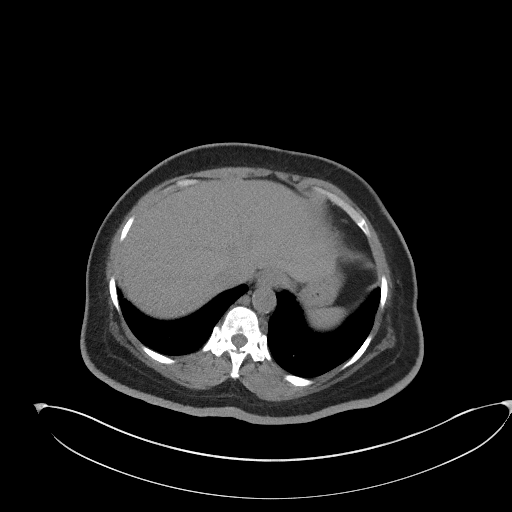
[im 84/88  soft-tissue]
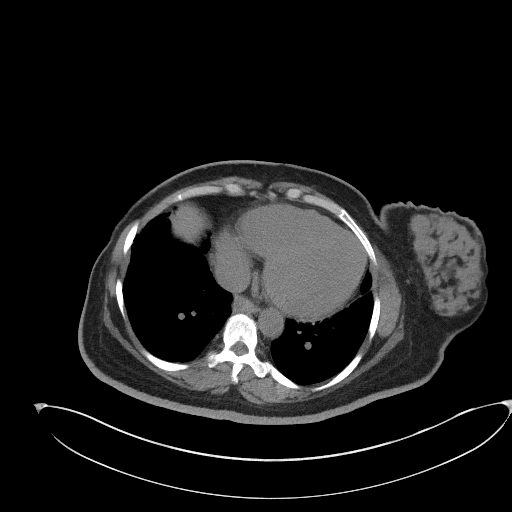

[Series 5: coronal · coronal · 0.81mm/px · 3 of 139 slices shown]
[im 47/139  soft-tissue]
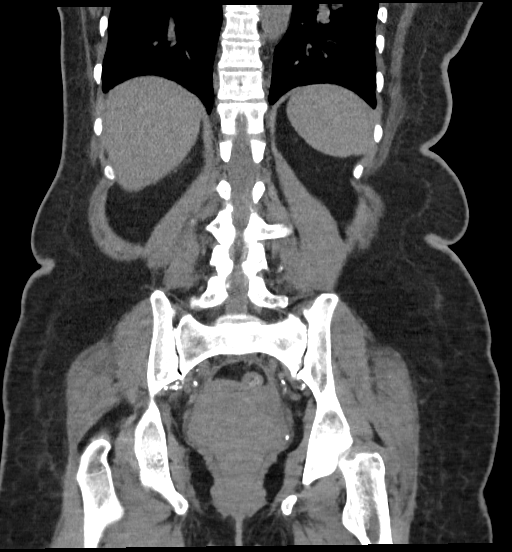
[im 62/139  soft-tissue]
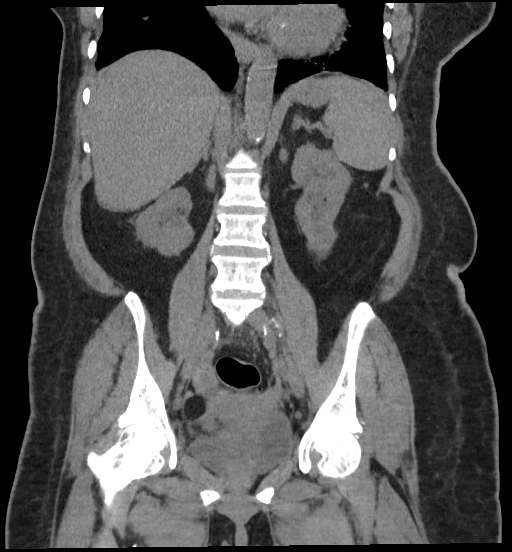
[im 77/139  soft-tissue]
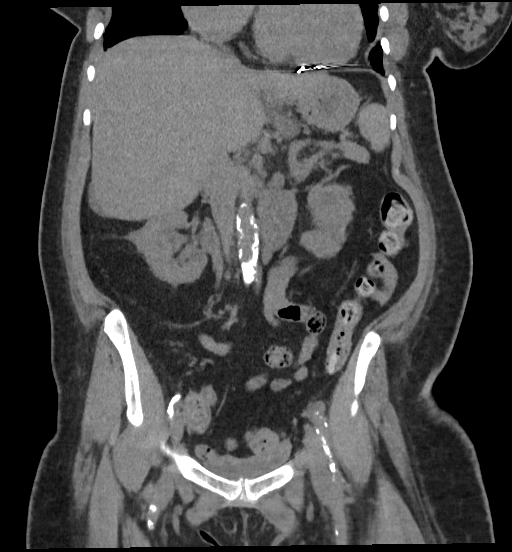

[17 of 46 positions shown; findings below may reference images not displayed]

FINDINGS: Lower chest: Lung bases show no acute consolidation or effusion.
Cardiomegaly.

Hepatobiliary: Distended gallbladder without calcified stone. No
focal hepatic abnormality or biliary dilatation

Pancreas: Unremarkable. No pancreatic ductal dilatation or
surrounding inflammatory changes.

Spleen: Normal in size without focal abnormality.

Adrenals/Urinary Tract: Adrenal glands are normal. Probable cortical
scarring within the bilateral kidneys. No hydronephrosis.
Low-density renal lesions incompletely characterized without
contrast. The bladder is unremarkable

Stomach/Bowel: The stomach is nonenlarged. No dilated small bowel.
No acute bowel wall thickening

Vascular/Lymphatic: Advanced aortic atherosclerosis. No aneurysm.
Mild retroperitoneal lymph nodes measuring up to 11 mm.

Reproductive: Uterus unremarkable.  No adnexal mass.

Other: Negative for pelvic effusion or free air.

Musculoskeletal: No acute osseous abnormality.
IMPRESSION: 1. Negative for hydronephrosis or ureteral stone. Probable cortical
scarring within the bilateral kidneys.
2. No CT evidence for acute intra-abdominopelvic abnormality.

## 2022-02-09 IMAGING — US US EXTREM LOW VENOUS*L*
1 series · 13 of 24 positions shown · non-contrast
Comparison: [DATE]

CLINICAL DATA: Left leg pain

EXAM:
Left LOWER EXTREMITY VENOUS DOPPLER ULTRASOUND
TECHNIQUE: Gray-scale sonography with compression, as well as color and duplex
ultrasound, were performed to evaluate the deep venous system(s)
from the level of the common femoral vein through the popliteal and
proximal calf veins.

[Series 1: us venous img lower uni left (dvt) · portal-venous · 13 of 36 slices shown]
[im 1/36]
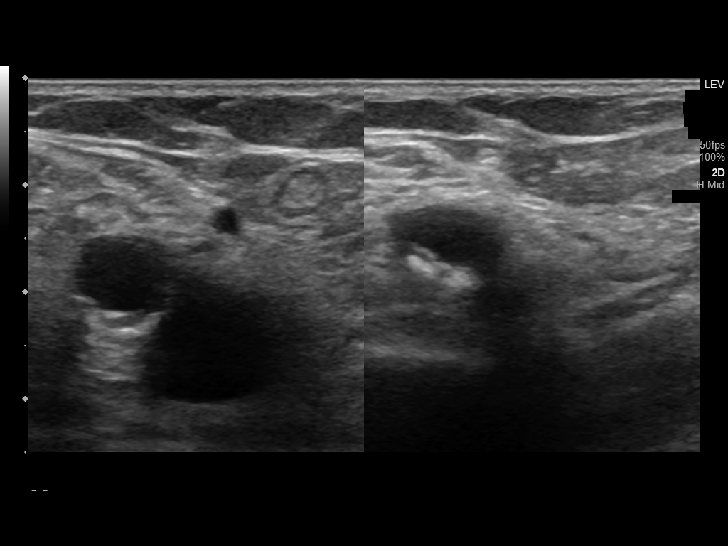
[im 4/36]
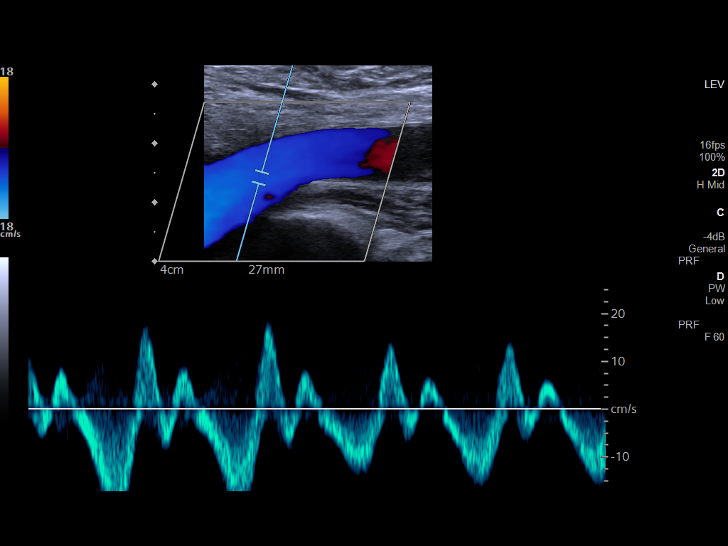
[im 7/36]
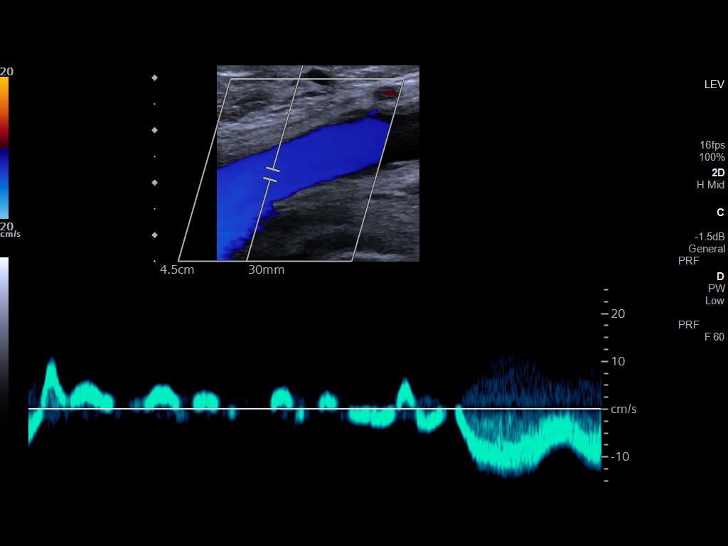
[im 10/36]
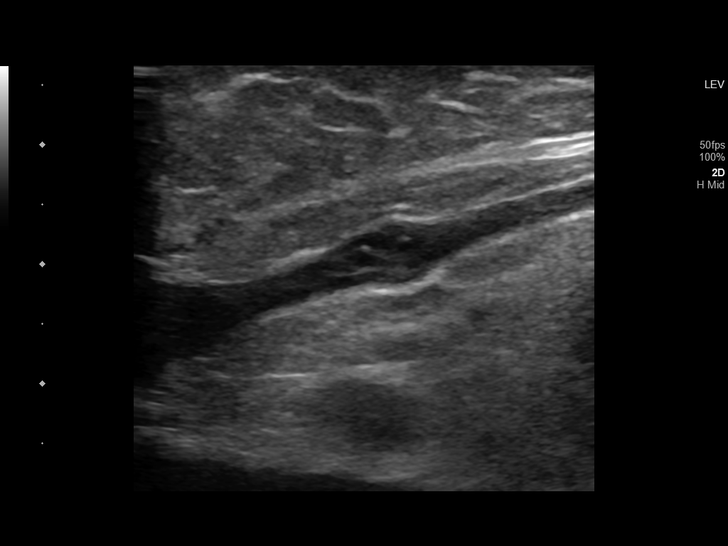
[im 13/36]
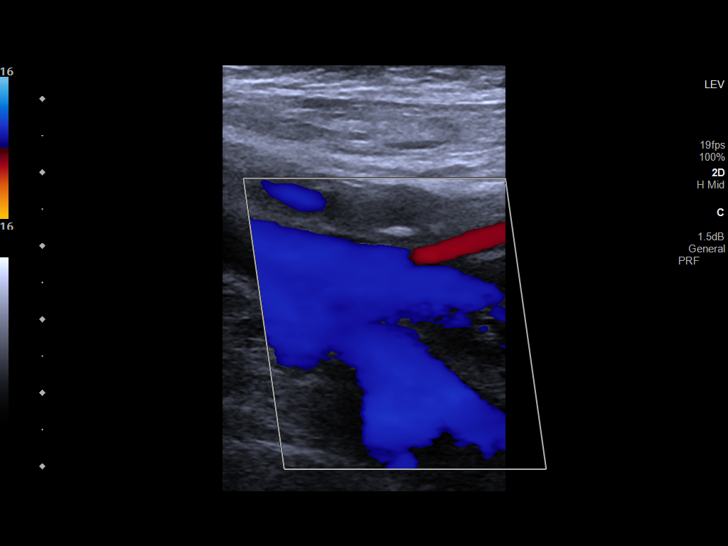
[im 16/36]
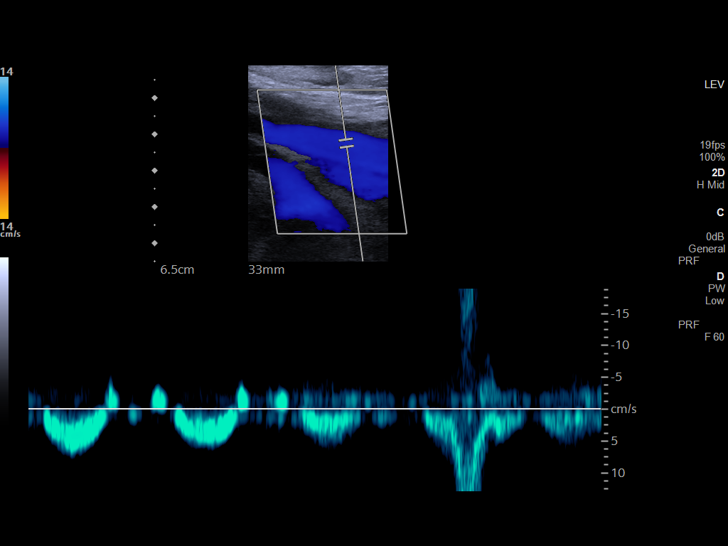
[im 19/36]
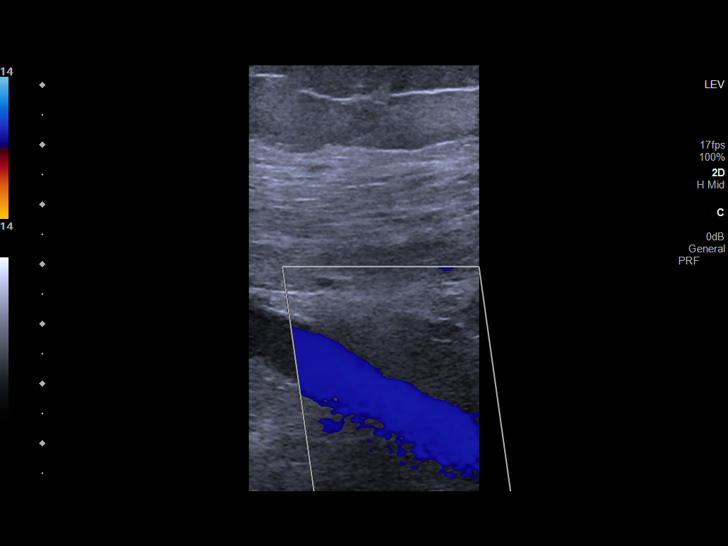
[im 20/36]
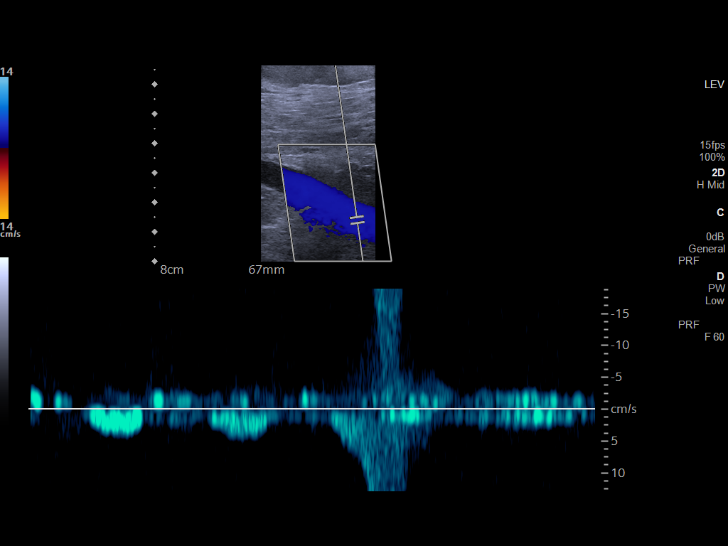
[im 23/36]
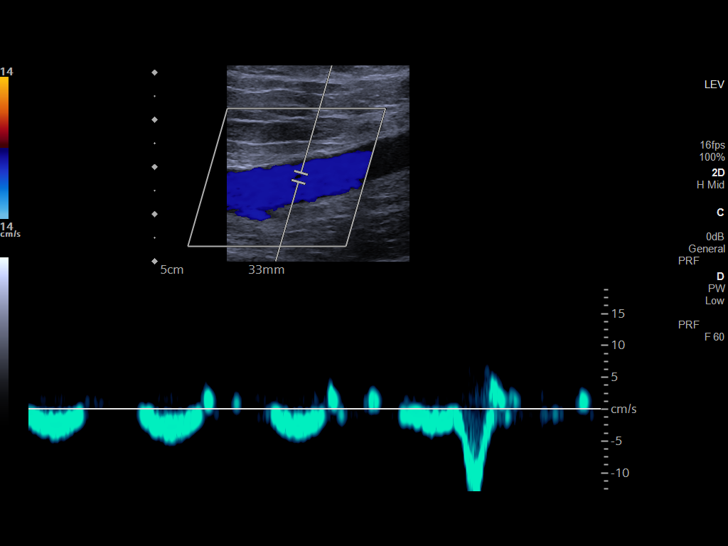
[im 26/36]
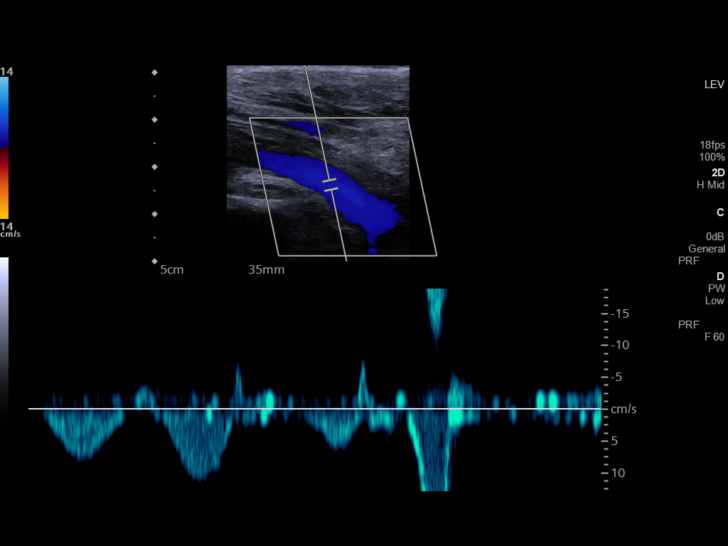
[im 29/36]
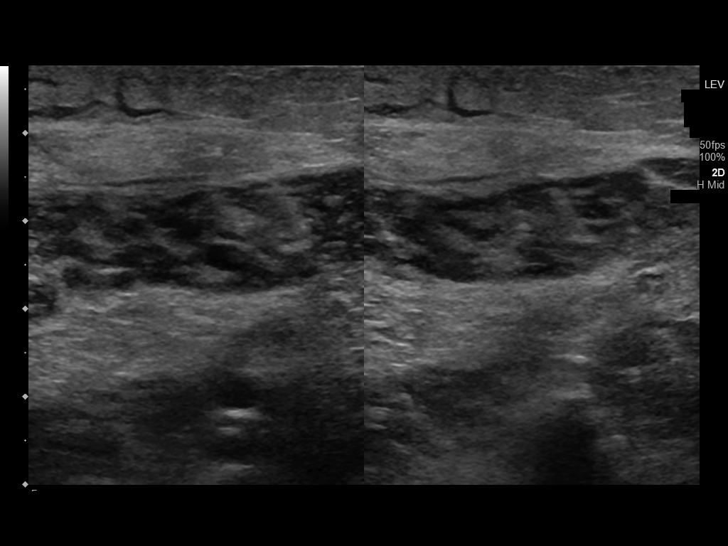
[im 32/36]
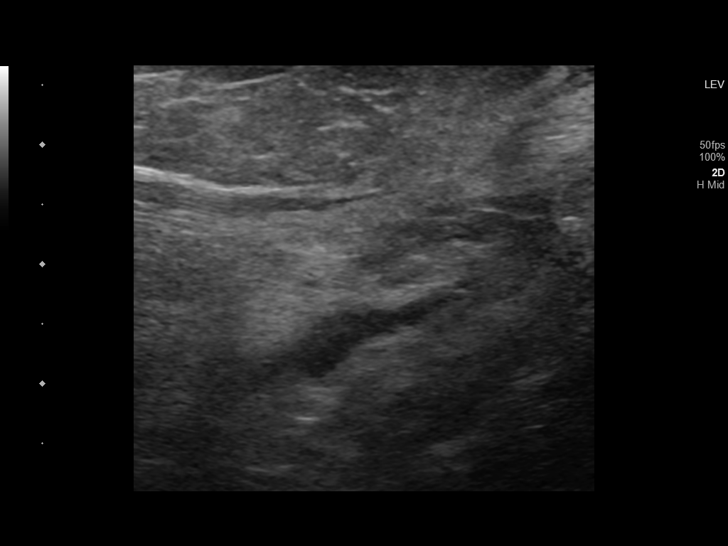
[im 36/36]
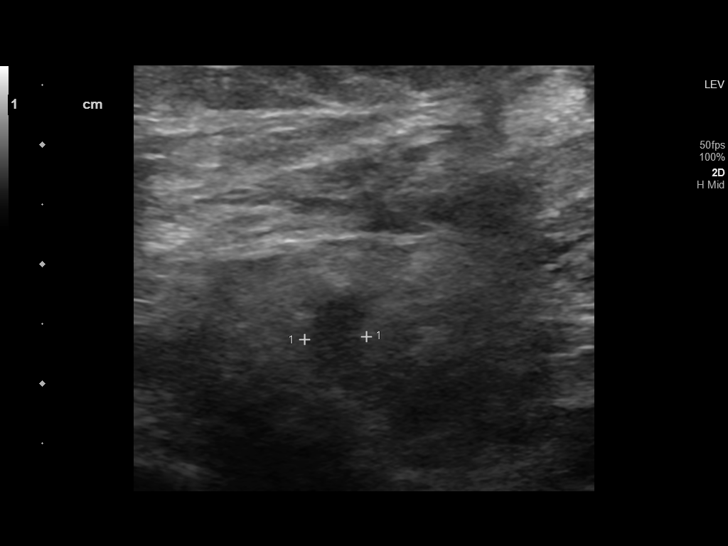

[13 of 24 positions shown; findings below may reference images not displayed]

FINDINGS: VENOUS

Patient would not allow compression views within the upper leg due
to pain. Normal compressibility of the popliteal veins, as well as
the visualized calf veins. Visualized portions of profunda femoral
vein. Suspicion of nonocclusive thrombus within the greater
saphenous vein in the upper thigh near the saphenofemoral junction.
Doppler waveforms show normal direction of venous flow, normal
respiratory plasticity and response to augmentation.

Limited views of the contralateral common femoral vein are
unremarkable.

OTHER

Small fluid collection in the popliteal fossa measuring 2 cm.

Limitations: Unable to perform compression views from the hip to the
knee due to pain
IMPRESSION: 1. The examination was limited by inability to perform compression
views from the hip to the knee secondary to severe pain. Deep veins
appear grossly patent on color flow images with normal waveforms.
2. Short segment of nonocclusive thrombus/thrombophlebitis within
the greater saphenous vein in the upper thigh near the
saphenofemoral junction.
3. 2 cm fluid collection in the popliteal fossa

## 2022-02-09 IMAGING — CT CT FEMUR *L* W/O CM
3 of 6 series · 12 of 36 positions shown, 14 images · non-contrast
Comparison: Plain film from earlier the same day

CLINICAL DATA: Left leg pain and hip pain, initial encounter

EXAM:
CT OF THE LOWER LEFT EXTREMITY WITHOUT CONTRAST
TECHNIQUE: Multidetector CT imaging of the lower left extremity was performed
according to the standard protocol.
RADIATION DOSE REDUCTION: This exam was performed according to the
departmental dose-optimization program which includes automated
exposure control, adjustment of the mA and/or kV according to
patient size and/or use of iterative reconstruction technique.

[Series 2: axial bone · axial · 0.62mm/px · z∈[-1452,-1092]mm · 5 of 271 slices shown, 7 images]
[im 46/271  soft-tissue]
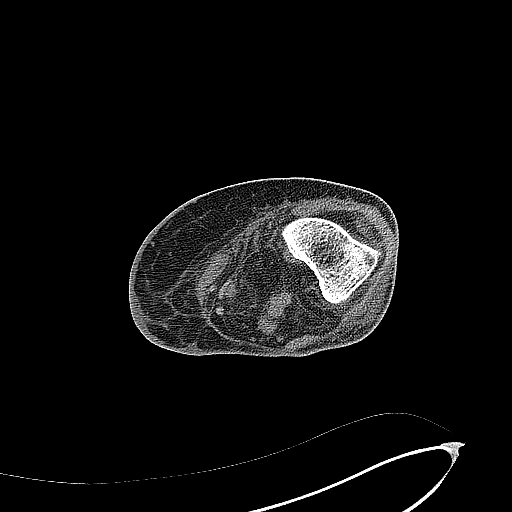
[im 46/271  bone]
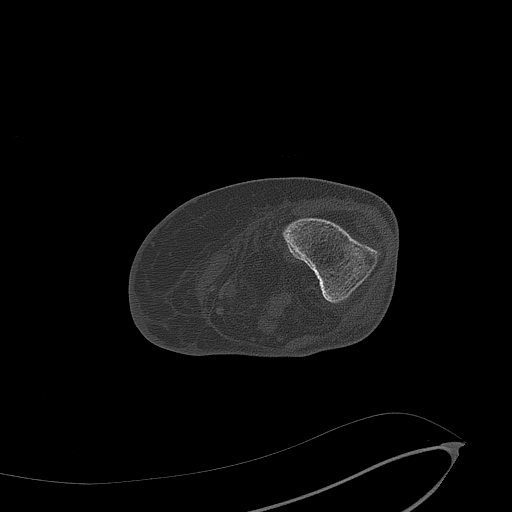
[im 91/271  bone]
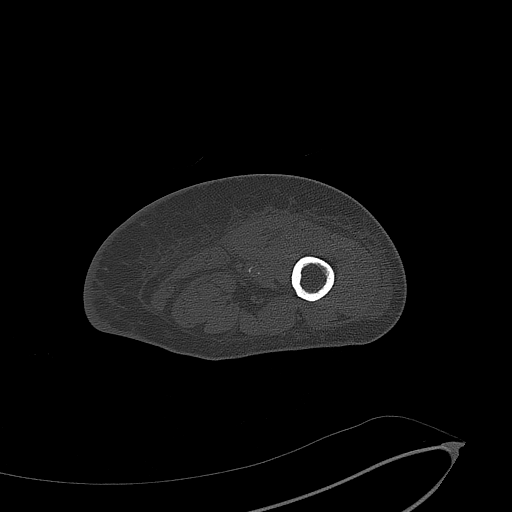
[im 136/271  bone]
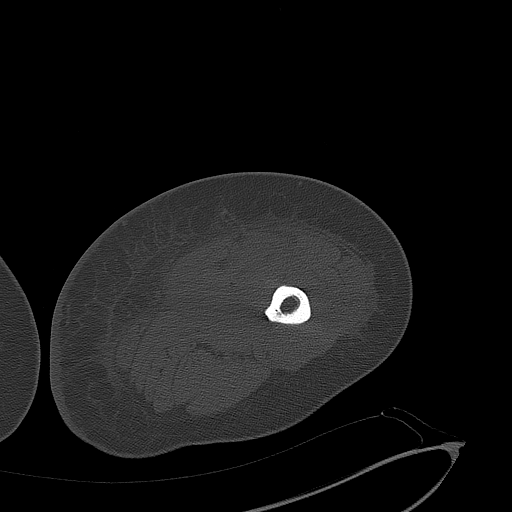
[im 181/271  bone]
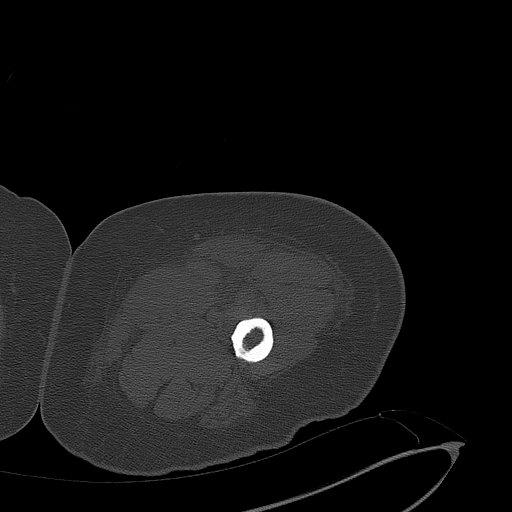
[im 226/271  soft-tissue]
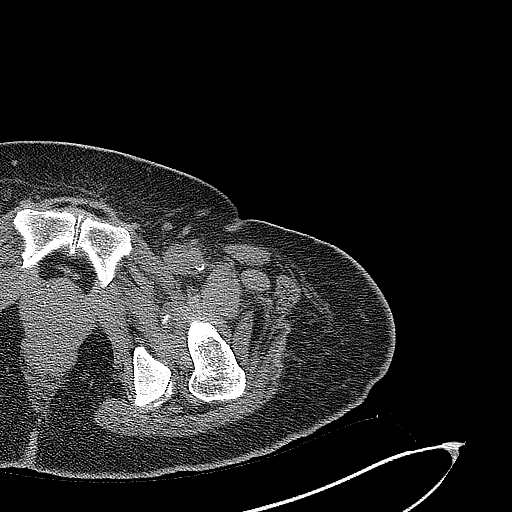
[im 226/271  bone]
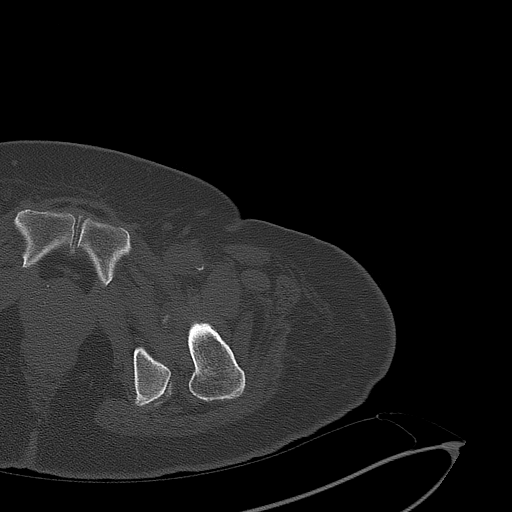

[Series 6: coronal bone · coronal · 0.66mm/px · 1 of 113 slices shown]
[im 57/113  bone]
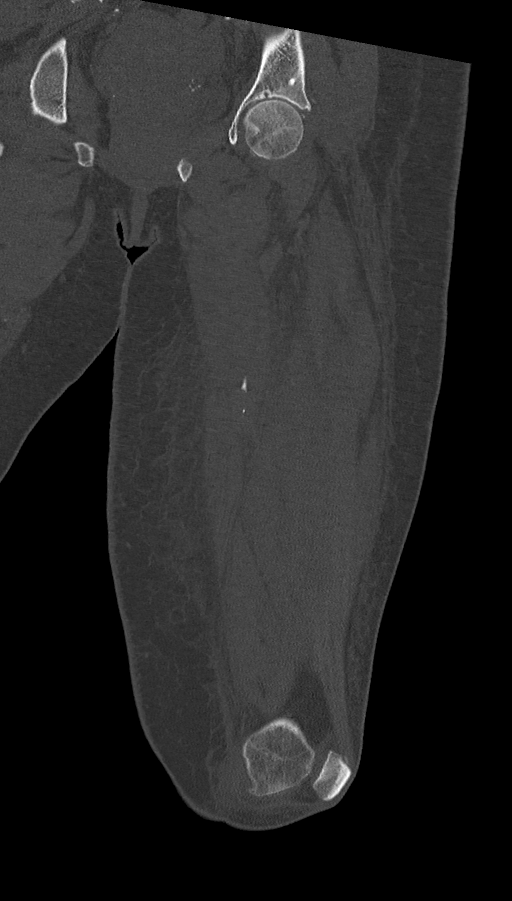

[Series 8: sagittal bone · sagittal · 0.61mm/px · 6 of 129 slices shown]
[im 19/129  bone]
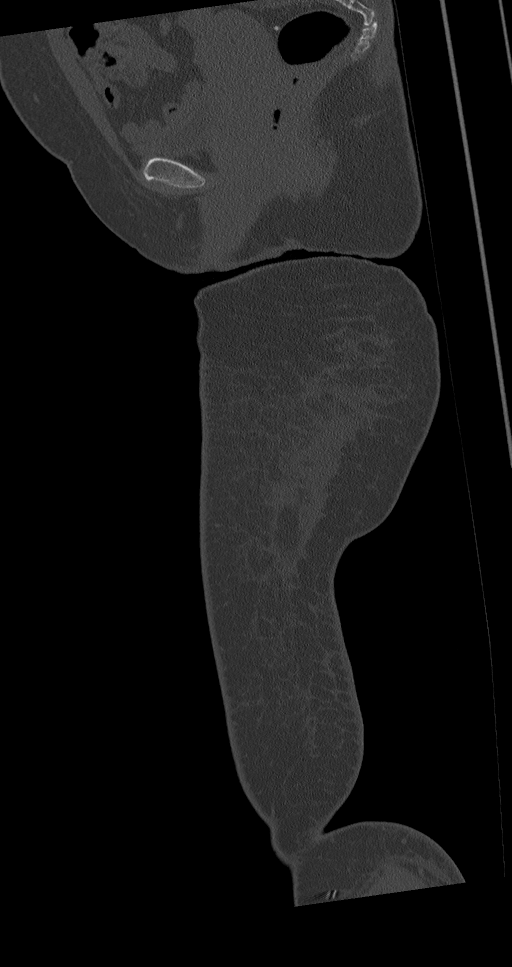
[im 37/129  bone]
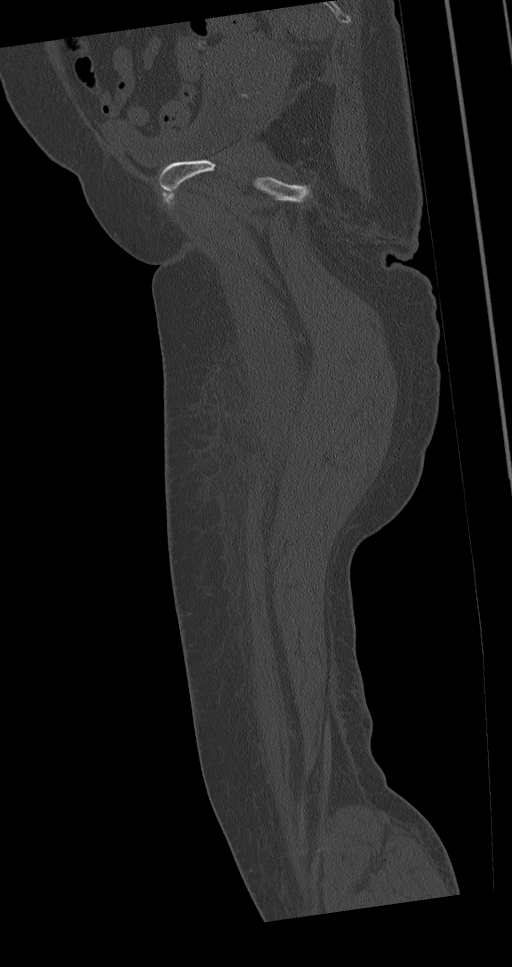
[im 55/129  bone]
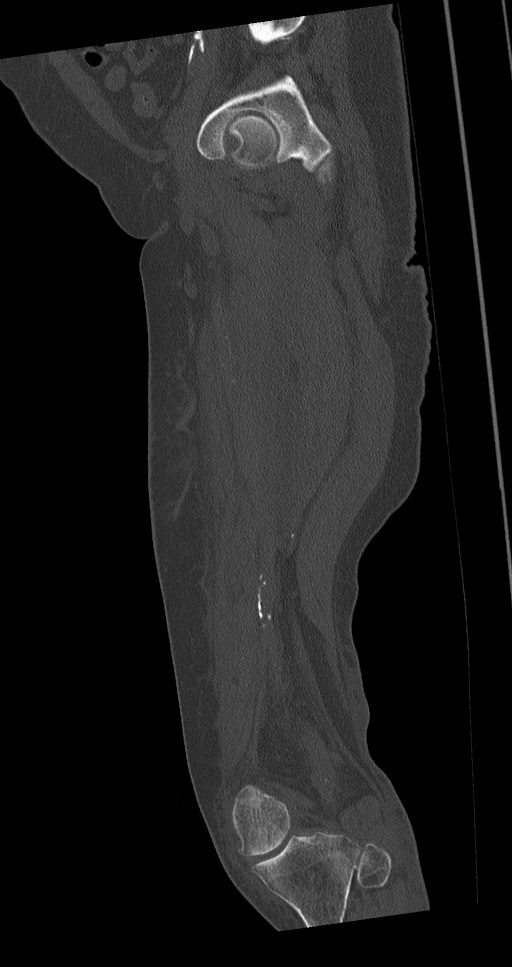
[im 74/129  bone]
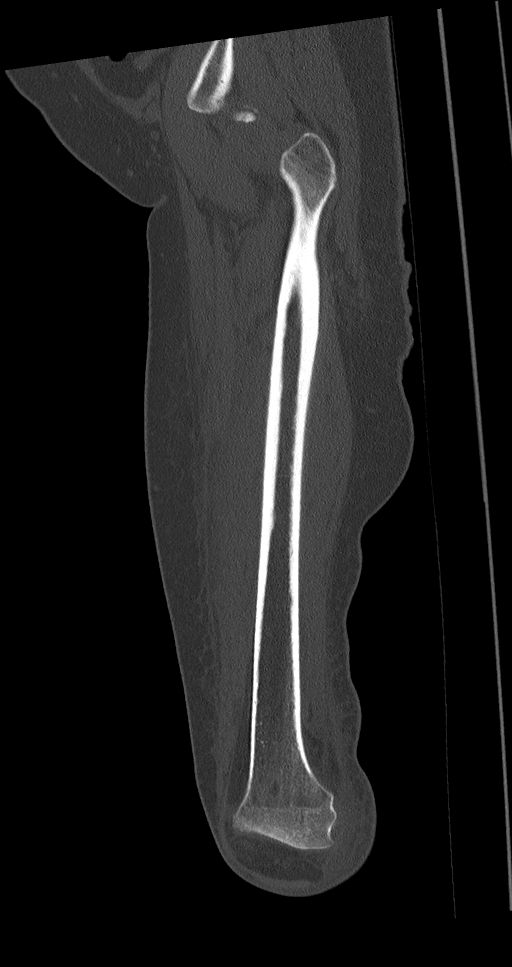
[im 86/129  soft-tissue]
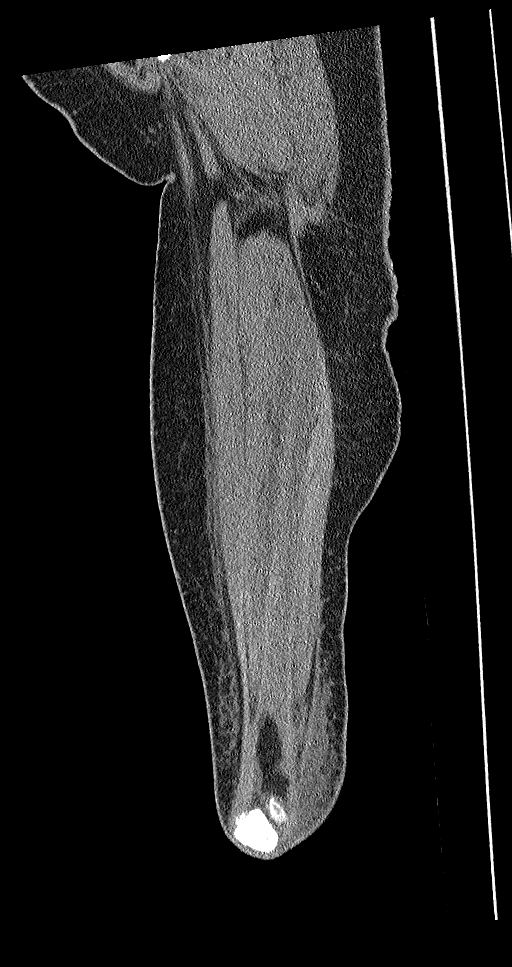
[im 92/129  bone]
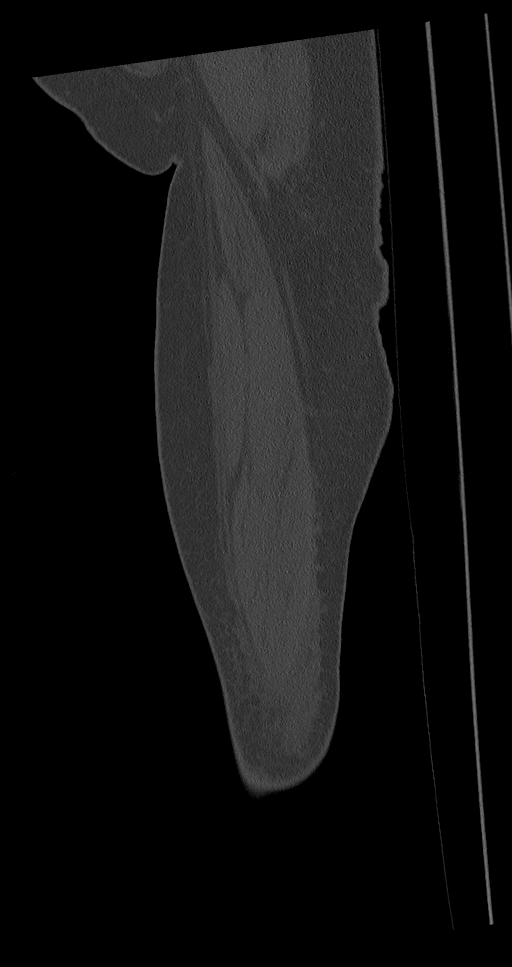

[12 of 36 positions shown; findings below may reference images not displayed]

FINDINGS: Bones/Joint/Cartilage

Degenerative changes of the left sacroiliac joint are noted. No
acute fracture or dislocation is noted pelvic bony structures appear
within normal limits.

Ligaments

Suboptimally assessed by CT.

Muscles and Tendons

Surrounding musculature appears within normal limits.

Soft tissues

Lower extremity edema is noted throughout the thigh particularly in
the mid and distal thigh. No focal hematoma is seen. Atherosclerotic
calcifications are noted.
IMPRESSION: No acute bony abnormality is noted.

Lower extremity edema is noted.

## 2022-02-09 IMAGING — MR MR FEMUR*L* W/O CM
19 series · 40 of 40 positions shown · non-contrast
Comparison: Left femoral series earlier today, left hip series
[DATE]

CLINICAL DATA: Left lower extremity swelling and pain.

EXAM:
MR OF THE LEFT FEMUR WITHOUT CONTRAST
TECHNIQUE: Multiplanar, multisequence MR imaging of the left thigh was
performed. No intravenous contrast was administered.

[Series 10: STIR · coronal · left · 4.5mm · 1.08mm/px · 2 of 34 slices shown (1 of 4)]
[im 1/34]
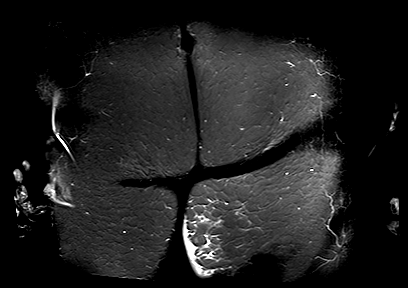
[im 34/34]
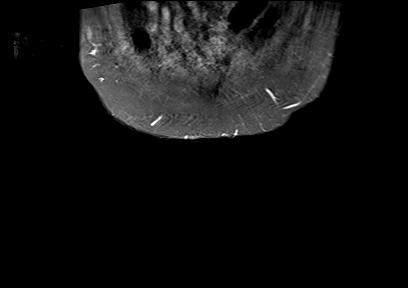

[Series 11: STIR · coronal · left · 4.5mm · 1.08mm/px · 2 of 34 slices shown (2 of 4)]
[im 1/34]
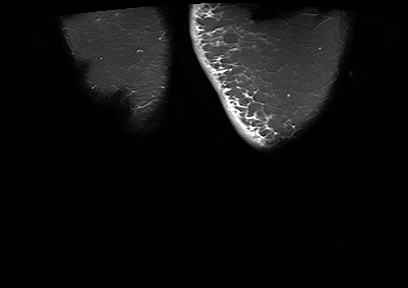
[im 34/34]
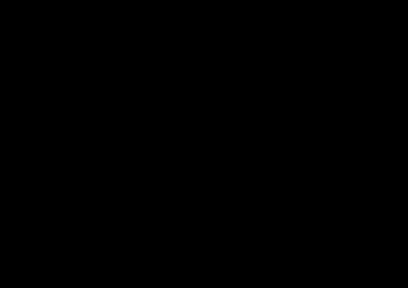

[Series 12: composed cor stir_comp · coronal · left · 5.6mm · 1.08mm/px · 2 of 34 slices shown (1 of 2)]
[im 1/34]
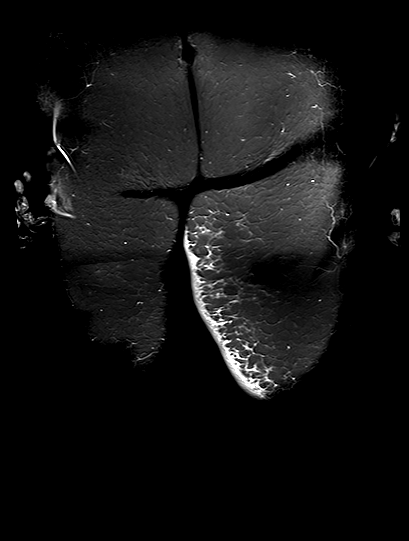
[im 34/34]
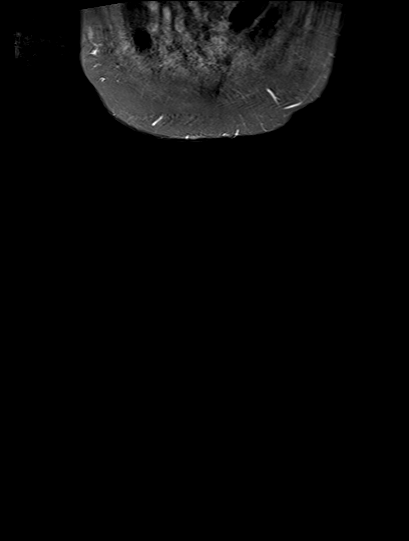

[Series 13: composed cor stir_comp_filt · coronal · left · 5.6mm · 1.08mm/px · 1 of 34 slices shown]
[im 1/34]
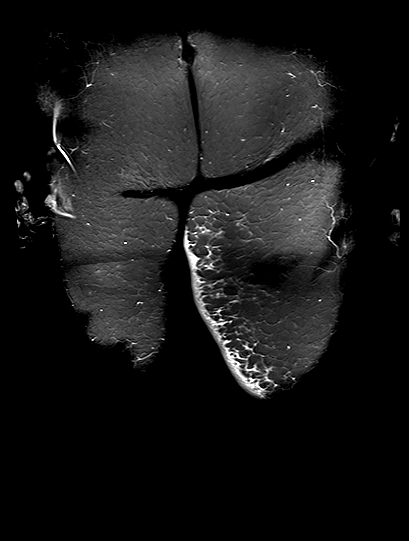

[Series 14: T1 · coronal · left · 4.5mm · 1.08mm/px · 1 of 34 slices shown (1 of 4)]
[im 1/34]
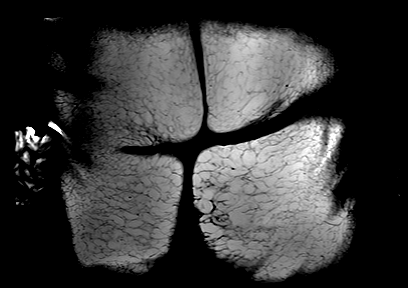

[Series 15: T1 · coronal · left · 4.5mm · 1.08mm/px · 1 of 34 slices shown (2 of 4)]
[im 1/34]
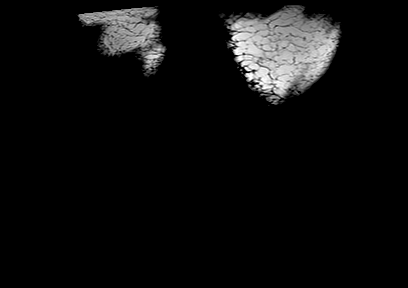

[Series 16: composed cor t1_comp · coronal · left · 5.6mm · 1.08mm/px · 1 of 34 slices shown]
[im 1/34]
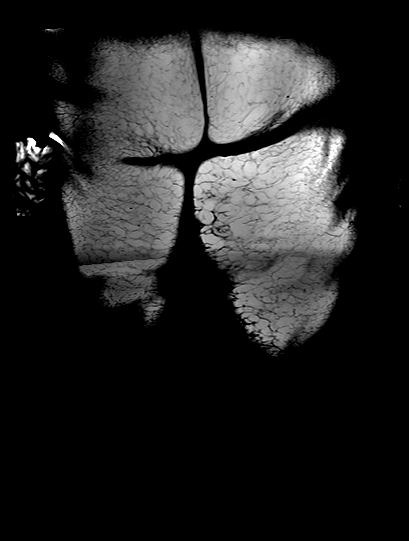

[Series 18: T1 · axial · left · 5.0mm · 0.86mm/px · z∈[-131,+151]mm · 2 of 48 slices shown (3 of 4)]
[im 1/48]
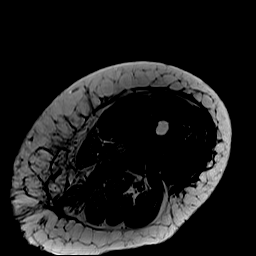
[im 48/48]
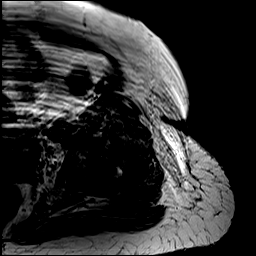

[Series 19: T1 · axial · left · 5.0mm · 0.86mm/px · z∈[-398,-116]mm · 2 of 48 slices shown (4 of 4)]
[im 1/48]
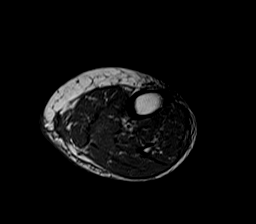
[im 48/48]
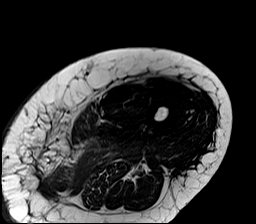

[Series 20: ax t1_comp · axial · left · 5.0mm · 0.86mm/px · z∈[-398,+151]mm · 4 of 92 slices shown]
[im 1/92]
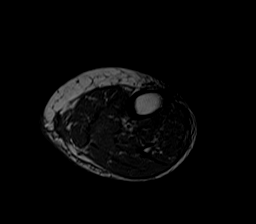
[im 31/92]
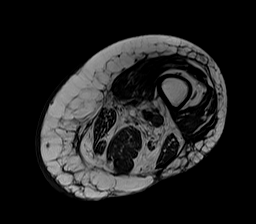
[im 61/92]
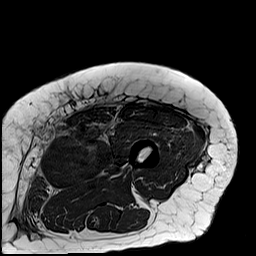
[im 92/92]
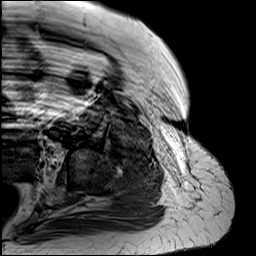

[Series 21: T2 fat-sat · axial · left · 5.0mm · 0.86mm/px · z∈[-131,+151]mm · 2 of 48 slices shown (1 of 2)]
[im 1/48]
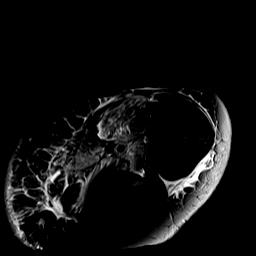
[im 48/48]
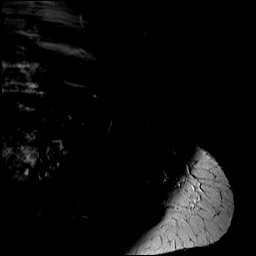

[Series 22: T2 fat-sat · axial · left · 5.0mm · 0.86mm/px · z∈[-398,-116]mm · 2 of 48 slices shown (2 of 2)]
[im 1/48]
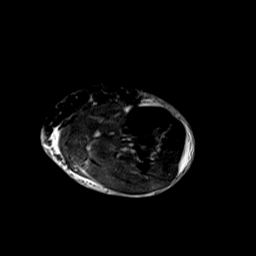
[im 48/48]
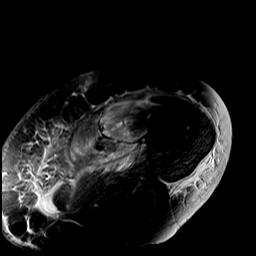

[Series 23: T2 · axial · left · 5.0mm · 0.86mm/px · z∈[-398,+151]mm · 4 of 92 slices shown]
[im 1/92]
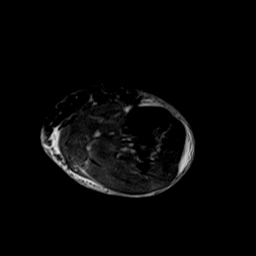
[im 31/92]
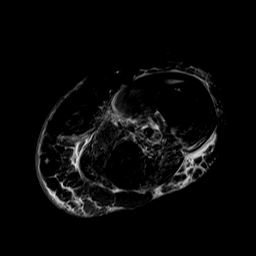
[im 61/92]
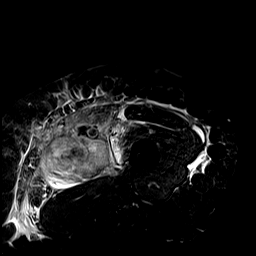
[im 92/92]
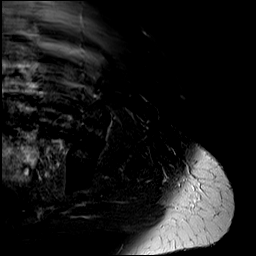

[Series 24: t2_tse_sag fs · sagittal · left · 4.5mm · 0.94mm/px · 2 of 40 slices shown (1 of 2)]
[im 1/40]
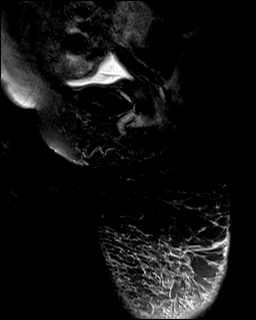
[im 40/40]
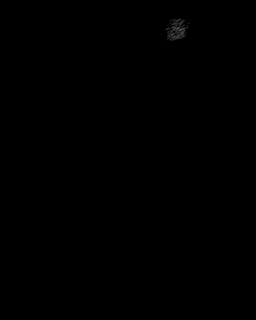

[Series 25: t2_tse_sag fs · sagittal · left · 4.5mm · 0.94mm/px · 2 of 39 slices shown (2 of 2)]
[im 1/39]
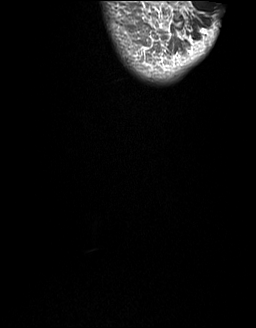
[im 39/39]
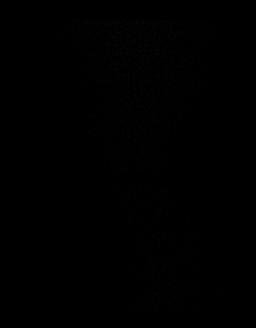

[Series 26: t2_tse_sag fs_comp · sagittal · left · 5.6mm · 0.94mm/px · 2 of 40 slices shown]
[im 1/40]
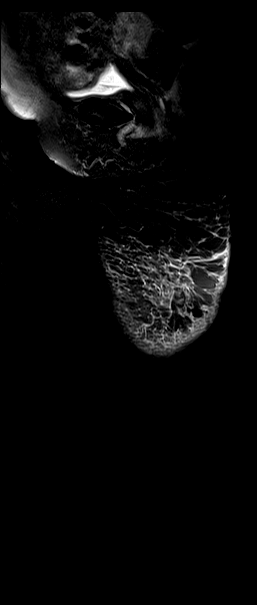
[im 40/40]
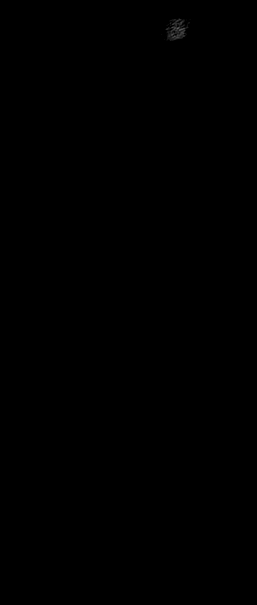

[Series 27: STIR · axial · left · 5.0mm · 0.83mm/px · z∈[-67,+215]mm · 2 of 48 slices shown (3 of 4)]
[im 1/48]
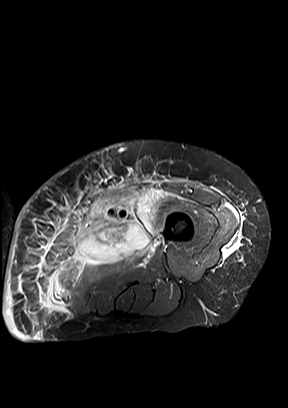
[im 48/48]
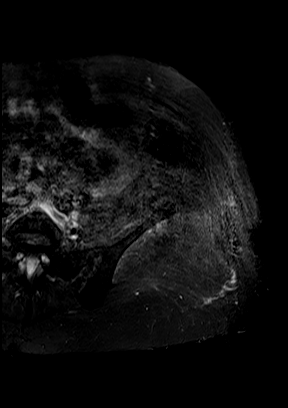

[Series 28: STIR · axial · left · 4.5mm · 0.83mm/px · z∈[-388,-73]mm · 2 of 57 slices shown (4 of 4)]
[im 1/57]
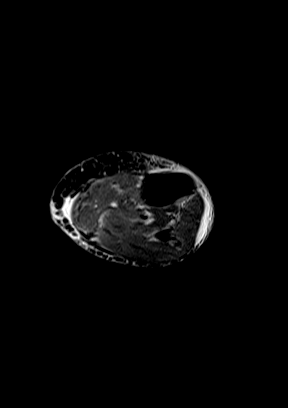
[im 57/57]
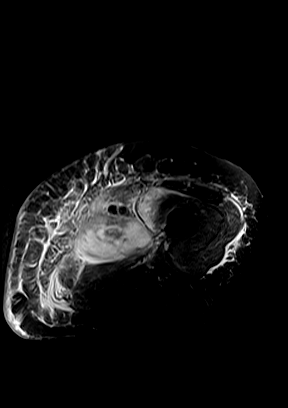

[Series 29: composed cor stir_comp · axial · left · 4.5mm · 0.83mm/px · z∈[-388,+215]mm · 4 of 105 slices shown (2 of 2)]
[im 1/105]
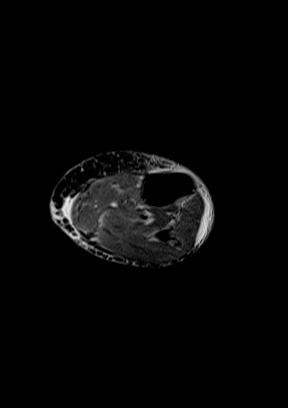
[im 35/105]
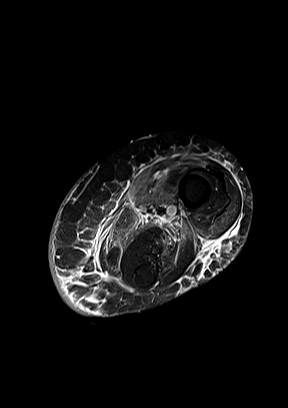
[im 70/105]
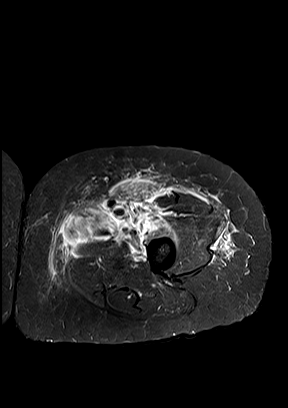
[im 105/105]
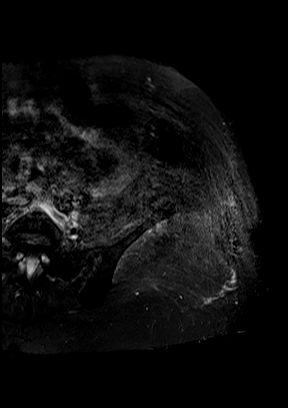

[40 of 40 positions shown; findings below may reference images not displayed]

FINDINGS: Bones/Joint/Cartilage

There is no evidence of fractures. Partial joint space loss is again
noted at the hips symmetrically. There is mild symmetric nonerosive
degenerative arthrosis at the knees, with symmetric small
suprapatellar bursal effusions. There are small amounts of fluid
symmetrically in the hip joint spaces possibly physiologic or could
be due to minimal symmetric joint effusions.

Ligaments

Not well evaluated given nondedicated technique. Grossly intact as
far seen. There is 1 cm of lateral patellar drift on the left which
could indicate medial patellofemoral ligament laxity, but the
ligament itself is intact.

Muscles and Tendons, soft tissues

There is diffuse edema primarily in the anterior compartment
musculature, most notably in the gracilis muscle, vastus medialis,
adductor longus, the sartorius muscle, and the medial aspect of the
vastus intermedius.

There is additional edema in the overlying subcutaneous plane
anteriorly and medially, and scattered fluid collecting along the
fat muscle interfaces in the thigh laterally, medially and
anteriorly.

There is fluid extending along the intermuscular septa as well,
primarily in the anterior compartment. There is a small
intramuscular collection in the vastus medialis measuring 2.9 x
cm with peripheral low signal intensity and small areas of signal
dropout within it, which could be a hematoma or abscess.

No other focal fluid collections are observed. Superficial edema
continues into the foreleg without further intramuscular edema.

Visualized portions of the left hemipelvis are unremarkable.
IMPRESSION: 1. No evidence of fractures.
2. Diffuse edema primarily involving the anterior compartment
musculature of the left thigh, with overlying edema in the
subcutaneous plane and nonlocalizing fluid along fat muscle
interfaces and intermuscular septa.
3. There is a 2.9 x 1.9 cm focal collection within the vastus
medialis which could be an intramuscular hematoma or abscess.
4. The primary differential considerations would be an infectious
process with multifocal myositis/pyomyositis with overlying
cellulitis, or edema due to a traumatic etiology. Certainly a
necrotizing fasciitis/compartment syndrome is not excluded.
5. Mild degenerative changes of the knee joints and hip joints, with
small amounts of symmetric bilateral uncomplicated hip joint and
suprapatellar bursal fluid.

## 2022-02-09 MED ORDER — MORPHINE SULFATE (PF) 2 MG/ML IV SOLN
2.0000 mg | INTRAVENOUS | Status: DC | PRN
  Administered 2022-02-10 – 2022-02-15 (×9): 2 mg via INTRAVENOUS
  Filled 2022-02-09 (×10): qty 1

## 2022-02-09 MED ORDER — OXYCODONE-ACETAMINOPHEN 5-325 MG PO TABS
1.0000 | ORAL_TABLET | Freq: Once | ORAL | Status: AC
  Administered 2022-02-09: 1 via ORAL
  Filled 2022-02-09: qty 1

## 2022-02-09 MED ORDER — SODIUM CHLORIDE 0.9 % IV SOLN: 1.0000 g | INTRAVENOUS | Status: DC

## 2022-02-09 MED ORDER — HYDROMORPHONE HCL 1 MG/ML IJ SOLN
0.5000 mg | Freq: Once | INTRAMUSCULAR | Status: AC
  Administered 2022-02-09: 0.5 mg via INTRAVENOUS
  Filled 2022-02-09: qty 1

## 2022-02-09 MED ORDER — HYDROMORPHONE HCL 1 MG/ML IJ SOLN
1.0000 mg | Freq: Once | INTRAMUSCULAR | Status: AC
  Administered 2022-02-09: 1 mg via INTRAVENOUS
  Filled 2022-02-09: qty 1

## 2022-02-09 MED ORDER — ACETAMINOPHEN 325 MG PO TABS
650.0000 mg | ORAL_TABLET | Freq: Four times a day (QID) | ORAL | Status: DC | PRN
  Administered 2022-02-10: 650 mg via ORAL

## 2022-02-09 MED ORDER — ACETAMINOPHEN 650 MG RE SUPP: 650.0000 mg | Freq: Four times a day (QID) | RECTAL | Status: DC | PRN

## 2022-02-09 MED ORDER — ONDANSETRON HCL 4 MG PO TABS: 4.0000 mg | ORAL_TABLET | Freq: Four times a day (QID) | ORAL | Status: DC | PRN

## 2022-02-09 MED ORDER — HEPARIN SODIUM (PORCINE) 5000 UNIT/ML IJ SOLN
5000.0000 [IU] | Freq: Three times a day (TID) | INTRAMUSCULAR | Status: DC
  Administered 2022-02-10 – 2022-02-16 (×20): 5000 [IU] via SUBCUTANEOUS
  Filled 2022-02-09 (×21): qty 1

## 2022-02-09 MED ORDER — HYDROCODONE-ACETAMINOPHEN 5-325 MG PO TABS
1.0000 | ORAL_TABLET | ORAL | Status: DC | PRN
  Filled 2022-02-09: qty 2

## 2022-02-09 MED ORDER — ONDANSETRON HCL 4 MG/2ML IJ SOLN
4.0000 mg | Freq: Four times a day (QID) | INTRAMUSCULAR | Status: DC | PRN
  Administered 2022-02-10: 4 mg via INTRAVENOUS
  Filled 2022-02-09: qty 2

## 2022-02-09 MED ORDER — CEFTRIAXONE SODIUM 2 G IJ SOLR
2.0000 g | Freq: Once | INTRAMUSCULAR | Status: AC
  Administered 2022-02-09: 2 g via INTRAVENOUS
  Filled 2022-02-09: qty 20

## 2022-02-09 NOTE — Assessment & Plan Note (Signed)
Continue amlodipine and carvedilol

## 2022-02-09 NOTE — Assessment & Plan Note (Addendum)
Seen by vascular with low suspicion for vascular etiology.  Negative for DVT on 2/14.  Venous Doppler on 2/21 showed grossly patent deep veins, short segment of nonocclusive thrombus/thrombophlebitis within the greater saphenous vein. MRI showed MRI showed diffuse edema primarily involving the anterior compartment musculature of the left thigh,  2.9 x 1.9 cm focal collection within the vastus medialis which could be an intramuscular hematoma or abscess.  Orthopedics /IR consulted, ultrasound did not show any abscess or collection so procedure canceled. Continue broad spectrum abx.F/U cultures,NGTD.  Left thigh edema, tenderness has significantly improved but she was still having pain,so  she underwent CT left thigh with contrast we did not show any collection/abscess but showed features of myositis/cellulitis.  Orthopedics recommending to continue antibiotics, no plan for surgical intervention.  Still having discomfort of the left thigh, added Robaxin

## 2022-02-09 NOTE — ED Notes (Signed)
Patient currently in MRI.

## 2022-02-09 NOTE — H&P (Signed)
History and Physical    Patient: Terry Arroyo:[id number] DOB: November 02, 1961 DOA: 02/09/2022 DOS: the patient was seen and examined on 02/09/2022 PCP: Kirk Ruths, MD  Patient coming from: Home  Chief Complaint:  Chief Complaint  Patient presents with   Abdominal Pain   Back Pain    HPI: Terry Arroyo is a 61 y.o. female with medical history significant of ESRD on HD, HFrEF, HTN, type 2 diabetes with polyneuropathy, depression, history of angioedema of unknown cause, COPD who presents to the ED with a complaint of pain of the left thigh of 10/10 intensity.  She also reports chronic back pain.  She was seen by vascular, Dr. Franchot Gallo on 2/17 who addressed her leg pain and found her to have stable atherosclerosis of the lower extremities with claudication and did not think the pain was vascular in etiology suggesting lumbar spinal disease as a possible primary etiology.  Prior to this, on 2/14 she was evaluated in the ED with venous ultrasound with no DVT seen in the extremity.  Patient was very tearful in triage due to pain intensity.  She otherwise denies other complaints  ED course: BP 156/83 with otherwise normal vitals CBC WNL, CMP as expected for dialysis, and with normal electrolytes.  Sed rate 49, lipase 25  CT left femur with no acute bony abnormality.  Lower extremity edema noted  Exam in the ED was concerning for possible cellulitis of the left thigh.  She was started on ceftriaxone.  Venous ultrasound and MRI ordered of the left lower extremity.  Hospitalist consulted for pain control   Review of Systems: As mentioned in the history of present illness. All other systems reviewed and are negative. Past Medical History:  Diagnosis Date   Anemia    Angina at rest [facility])    Angioedema 03/20/2021   Lisinopril   Anterior cerebral aneurysm    approximately 5 mm; left MCA    Anxiety    Aortic atherosclerosis (HCC)    Asthma    Blind left eye    COPD (chronic  obstructive pulmonary disease) (HCC)    Coronary artery disease    Depression    DOE (dyspnea on exertion)    ESRD (end stage renal disease) (HCC)    Gait instability    Genital herpes    GERD (gastroesophageal reflux disease)    HFrEF (heart failure with reduced ejection fraction) ([place])    History of 2019 novel coronavirus disease (COVID-19) 03/2019   [place]   History of DVT (deep vein thrombosis)    History of DVT (deep vein thrombosis)    Hx of CABG 2018   x 4; performed in [place]   Hyperlipidemia    Hypertension    MI (myocardial infarction) ([place]) 2018   no stents   Pituitary adenoma ([place])    PVD (posterior vitreous detachment), right eye    Secondary hyperparathyroidism ([place])    Sleep apnea    non-compliant with nocturnal PAP therapy   Stroke [facility]) 2014   stated affected left side.   T2DM (type 2 diabetes mellitus) [facility])    Past Surgical History:  Procedure Laterality Date   A/V FISTULAGRAM Right 12/29/2021   Procedure: A/V FISTULAGRAM;  Surgeon: Katha Cabal, MD;  Location: [place] CV LAB;  Service: Cardiovascular;  Laterality: Right;   AV FISTULA PLACEMENT Right 01/28/2021   Procedure: ARTERIOVENOUS (AV) FISTULA CREATION;  Surgeon: Katha Cabal, MD;  Location: [facility]  ORS;  Service: Vascular;  Laterality: Right;   AV FISTULA PLACEMENT Right 05/13/2021   Procedure: INSERTION OF ARTERIOVENOUS (AV) GORE-TEX GRAFT ARM ( BRACHIAL AXILLARY );  Surgeon: Katha Cabal, MD;  Location: [facility] ORS;  Service: Vascular;  Laterality: Right;   [place]   pituitary tumor   BREAST EXCISIONAL BIOPSY Left yrs ago   benign   CESAREAN SECTION     CORONARY ARTERY BYPASS GRAFT  2018   x 4 vessels; performed in [place] N/A 11/07/2020   Procedure: DIALYSIS/PERMA CATHETER INSERTION;  Surgeon: Algernon Huxley, MD;  Location: [place] CV LAB;  Service: Cardiovascular;  Laterality: N/A;    DIALYSIS/PERMA CATHETER REMOVAL N/A 06/12/2021   Procedure: DIALYSIS/PERMA CATHETER REMOVAL;  Surgeon: Algernon Huxley, MD;  Location: [place] CV LAB;  Service: Cardiovascular;  Laterality: N/A;   ESOPHAGOGASTRODUODENOSCOPY (EGD) WITH PROPOFOL N/A 12/22/2021   Procedure: ESOPHAGOGASTRODUODENOSCOPY (EGD) WITH PROPOFOL;  Surgeon: Lesly Rubenstein, MD;  Location: [facility] ENDOSCOPY;  Service: Gastroenterology;  Laterality: N/A;  IDDM   EYE SURGERY Right 2021   cataracts    PERIPHERAL VASCULAR THROMBECTOMY Right 04/03/2021   Procedure: A/V Fistulagram ;  Surgeon: Katha Cabal, MD;  Location: [place] CV LAB;  Service: Cardiovascular;  Laterality: Right;   Social History:  reports that she quit smoking about 5 years ago. Her smoking use included cigarettes. She has never used smokeless tobacco. She reports that she does not currently use alcohol. She reports that she does not currently use drugs.  Allergies  Allergen Reactions   Hydralazine Hives, Shortness Of Breath, Swelling and Rash    Body aches    Kiwi Extract Hives, Swelling and Rash    Mouth swells    Lisinopril Swelling    Angioedema   Shellfish Allergy Anaphylaxis    Shrimp/lobster  Betadine leaves welts on skin     Betadine [Povidone Iodine] Hives and Rash    LEAVES WELTS ON SKIN   Metformin Diarrhea and Other (See Comments)    GI Intolerance   Tape Itching    Use paper tape whenever possible    Family History  Problem Relation Age of Onset   CAD Mother    CAD Brother    Breast cancer Sister 52    Prior to Admission medications   Medication Sig Start Date End Date Taking? Authorizing Provider  acetaminophen (TYLENOL) 500 MG tablet Take 1,000 mg by mouth every 6 (six) hours as needed for mild pain or moderate pain.    [provider]  albuterol (VENTOLIN HFA) 108 (90 Base) MCG/ACT inhaler Inhale 2 puffs into the lungs every 6 (six) hours as needed for wheezing or shortness of breath.    [provider]  amLODipine (NORVASC) 10 MG tablet Take 1 tablet (10 mg total) by mouth daily. 03/03/20   Lorella Nimrod, MD  ammonium lactate (LAC-HYDRIN) 12 % lotion Apply 1 application topically every Monday, Wednesday, and Friday.    [provider]  aspirin 81 MG EC tablet Take 1 tablet (81 mg total) by mouth daily. 02/10/21   Max Sane, MD  atorvastatin (LIPITOR) 80 MG tablet Take 1 tablet (80 mg total) by mouth daily at 6 PM. Patient taking differently: Take 80 mg by mouth daily. 11/26/19   Lavina Hamman, MD  Blood Glucose Monitoring Suppl (TRUE METRIX METER) w/Device KIT  09/02/21   [provider]  carvedilol (COREG) 25 MG tablet Take 25 mg  by mouth 2 (two) times daily.    [provider]  furosemide (LASIX) 80 MG tablet Take 80 mg by mouth 4 (four) times a week. Tues, Thurs, Sat, and Sun (non-dialysis days)    [provider]  gabapentin (NEURONTIN) 100 MG capsule Take 100 mg by mouth daily as needed (pain). 10/14/21   [provider]  insulin aspart protamine- aspart (NOVOLOG MIX 70/30) (70-30) 100 UNIT/ML injection Inject 0.15 mLs (15 Units total) into the skin 2 (two) times daily with a meal. Patient taking differently: Inject 15 Units into the skin 3 (three) times daily with meals. 11/12/20   Fritzi Mandes, MD  isosorbide mononitrate (IMDUR) 30 MG 24 hr tablet Take 30 mg by mouth daily.    [provider]  lidocaine-prilocaine (EMLA) cream Apply 1 application topically as needed (fistula access).    [provider]  methocarbamol (ROBAXIN) 500 MG tablet Take 1 tablet (500 mg total) by mouth every 8 (eight) hours as needed for muscle spasms. 02/02/22   Triplett, Cari B, FNP  mirtazapine (REMERON) 30 MG tablet Take 30 mg by mouth at bedtime.    [provider]  multivitamin (RENA-VIT) TABS tablet Take 1 tablet by mouth every Monday, Wednesday, and Friday.    [provider]  omeprazole (PRILOSEC) 40 MG capsule  Take 40 mg by mouth daily. 09/22/20   [provider]  ondansetron (ZOFRAN ODT) 4 MG disintegrating tablet Take 1 tablet (4 mg total) by mouth every 8 (eight) hours as needed. 11/04/21   Rudene Re, MD  oxyCODONE (OXY IR/ROXICODONE) 5 MG immediate release tablet Take 1-2 tablets (5-10 mg total) by mouth every 6 (six) hours as needed for severe pain. 02/04/22   Schnier, [name] Lory, MD  polyethylene glycol (MIRALAX / GLYCOLAX) 17 g packet Take 17 g by mouth daily. Patient taking differently: Take 17 g by mouth daily as needed for moderate constipation. 02/11/21   Lorella Nimrod, MD  sucralfate (CARAFATE) 1 g tablet Take 1 g by mouth 2 (two) times daily before a meal.    [provider]  topiramate (TOPAMAX) 25 MG tablet Take 25 mg by mouth daily. 08/19/20   [provider]  traZODone (DESYREL) 50 MG tablet Take 50 mg by mouth at bedtime.    [provider]  TRUE METRIX BLOOD GLUCOSE TEST test strip  09/04/21   [provider]  vitamin B-12 (CYANOCOBALAMIN) 1000 MCG tablet Take 1,000 mcg by mouth daily.    [provider]    Physical Exam: Vitals:   02/09/22 1655 02/09/22 2219  BP: (!) 156/83 (!) 143/73  Pulse: 70 64  Resp: 20 20  Temp: 99 F (37.2 C)   TempSrc: Oral   SpO2: 96% 97%   Physical Exam Vitals and nursing note reviewed.  Constitutional:      General: She is not in acute distress.    Appearance: Normal appearance.  HENT:     Head: Normocephalic and atraumatic.  Cardiovascular:     Rate and Rhythm: Normal rate and regular rhythm.     Pulses: Normal pulses.     Heart sounds: Normal heart sounds. No murmur heard. Pulmonary:     Effort: Pulmonary effort is normal.     Breath sounds: Normal breath sounds. No wheezing or rhonchi.  Abdominal:     General: Bowel sounds are normal.     Palpations: Abdomen is soft.     Tenderness: There is no abdominal tenderness.  Musculoskeletal:  General: No swelling or  tenderness. Normal range of motion.     Cervical back: Normal range of motion and neck supple.     Comments: Erythema, warmth, pain and induration medial left thigh  Skin:    General: Skin is warm and dry.     Comments: Erythema, warmth, pain and induration medial left thigh  Neurological:     General: No focal deficit present.     Mental Status: She is alert. Mental status is at baseline.  Psychiatric:        Mood and Affect: Mood normal.        Behavior: Behavior normal.     Data Reviewed: Data Reviewed: Relevant notes from primary care and specialist visits, past discharge summaries as available in EHR, including Care Everywhere. Prior diagnostic testing as pertinent to current admission diagnoses Updated medications and problem lists for reconciliation ED course, including vitals, labs, imaging, treatment and response to treatment Triage notes, nursing and pharmacy notes and ED provider's notes Notable results as noted in HPI   Assessment and Plan: * Acute pain of left thigh- (present on admission) Etiology uncertain Seen by vascular with low suspicion for vascular etiology.  Negative for DVT on 2/14 Possibly radiculopathy versus neurogenic claudication We will treat for cellulitis as outlined below Pain control  Cellulitis of left thigh Continue Rocephin Keep leg elevated Follow-up MRI ordered from the ED CT showed no acute bony injury  ESRD on dialysis [facility]) Nephrology consult for continuation of dialysis  CAD (coronary artery disease)- (present on admission) Patient has history of CABG Continue aspirin, atorvastatin, carvedilol and Imdur  HFrEF (heart failure with reduced ejection fraction) ([place])- (present on admission) Currently euvolemic Continue furosemide, carvedilol, Imdur  Essential hypertension- (present on admission) Continue amlodipine and carvedilol  Type 2 diabetes mellitus with hyperlipidemia ([place])- (present on admission) Sliding scale insulin  coverage for now       Advance Care Planning:   Code Status: Prior   Consults: renal  Family Communication: none  Severity of Illness: The appropriate patient status for this patient is OBSERVATION. Observation status is judged to be reasonable and necessary in order to provide the required intensity of service to ensure the patient's safety. The patient's presenting symptoms, physical exam findings, and initial radiographic and laboratory data in the context of their medical condition is felt to place them at decreased risk for further clinical deterioration. Furthermore, it is anticipated that the patient will be medically stable for discharge from the hospital within 2 midnights of admission.   Author: Athena Masse, MD 02/09/2022 10:40 PM  For on call review [email or web address].

## 2022-02-09 NOTE — Assessment & Plan Note (Signed)
Nephrology consult for continuation of dialysis 

## 2022-02-09 NOTE — ED Triage Notes (Addendum)
Pt comes with c/o right side flank pain and back pain. Pt states this started this weekend. Pt states  10/10 pain. Pt crying while in triage.  Pt states vomiting as well. Pt denies any diarrhea.

## 2022-02-09 NOTE — ED Notes (Signed)
PA Gerald Stabs speaking to pt and pt stating left pain to leg.

## 2022-02-09 NOTE — ED Provider Triage Note (Addendum)
Emergency Medicine Provider Triage Evaluation Note  Terry Arroyo , a 61 y.o. female  was evaluated in triage.  Pt complains of left hip pain.  Was seen in the ER, had x-rays of the left hip showing some arthritic changes on 02/02/2022.  Also had negative ultrasound of left lower extremity.  She complains of nontraumatic left hip pain, states she is unable to bear weight on the left leg.  She denies any numbness.  Pain is along the groin and lateral hip but also in the femur to the knee.  Pain is severe..  Review of Systems  Positive: Left leg pain, inability to walk and bear weight on left lower extremity Negative: Numbness, tingling, fevers, chills  Physical Exam  BP (!) 156/83 (BP Location: Left Arm)    Pulse 70    Temp 99 F (37.2 C) (Oral)    Resp 20    SpO2 96%  Gen:   Awake, no distress presents in a wheelchair Resp:  Normal effort MSK:   Left thigh compartments are soft.  Unable to internally or externally rotate left hip.  Severe pain with attempted passive range of motion of the left lower leg.  Leg is held in externally rotated position  Medical Decision Making  Medically screening exam initiated at 5:02 PM.  Appropriate orders placed.  [name] was informed that the remainder of the evaluation will be completed by another provider, this initial triage assessment does not replace that evaluation, and the importance of remaining in the ED until their evaluation is complete.  We will order x-ray of the femur and basic blood work.  We will give her oxycodone 5 mg for pain.  May need to consider MRI of the left hip, high suspicion for hip fracture   Duanne Guess, PA-C 02/09/22 1704    Duanne Guess, PA-C 02/09/22 1711

## 2022-02-09 NOTE — ED Provider Notes (Signed)
[facility] Provider Note    Event Date/Time   First MD Initiated Contact with Patient 02/09/22 1918     (approximate)   History   Abdominal Pain and Back Pain   HPI  Terry Arroyo is a 61 y.o. female with ESRD who comes over from Dr. Tonette Bihari office for leg pain.  Patient has a very swollen and painful leg and they try to order an outpatient CT but insurance was not approving it so patient was sent here for imaging.  I reviewed the note from the primary care doctor which stated that he wanted some CT scans without to evaluate for an occult fracture.  Patient reports pain there for the past week.  She states that she has been able to ambulate somewhat with a cane but still having increasing pain.  She had a negative ultrasound recently as well as a left hip x-ray.  Denies any falls.  I reviewed the records and patient was seen in the ER 2/14 followed up by vascular surgery on 2/16 and saw her primary care doctor today on 2/21    Physical Exam   Triage Vital Signs: ED Triage Vitals  Enc Vitals Group     BP 02/09/22 1655 (!) 156/83     Pulse Rate 02/09/22 1655 70     Resp 02/09/22 1655 20     Temp 02/09/22 1655 99 F (37.2 C)     Temp Source 02/09/22 1655 Oral     SpO2 02/09/22 1655 96 %     Weight --      Height --      Head Circumference --      Peak Flow --      Pain Score 02/09/22 1654 10     Pain Loc --      Pain Edu? --      Excl. in [place]? --     Most recent vital signs: Vitals:   02/09/22 1655  BP: (!) 156/83  Pulse: 70  Resp: 20  Temp: 99 F (37.2 C)  SpO2: 96%     General: Awake, no distress.  CV:  Good peripheral perfusion.  Resp:  Normal effort.  Abd:  No distention.  Other:  Patient has some warmth and firmness noted to the left inner thigh with slight discoloration.  She is got limited range of motion secondary to pain.  She is got 2+ distal pulse.  ED Results / Procedures / Treatments   Labs (all labs ordered are  listed, but only abnormal results are displayed) Labs Reviewed  COMPREHENSIVE METABOLIC PANEL - Abnormal; Notable for the following components:      Result Value   Chloride 94 (*)    Glucose, Bld 104 (*)    BUN 33 (*)    Creatinine, Ser 6.18 (*)    Calcium 8.8 (*)    Total Bilirubin 1.7 (*)    GFR, Estimated 7 (*)    All other components within normal limits  LIPASE, BLOOD  CBC  URINALYSIS, ROUTINE W REFLEX MICROSCOPIC     RADIOLOGY I have reviewed the xray personally and see no evidence of fracture  CT-pending    PROCEDURES:  Critical Care performed: No  Procedures   MEDICATIONS ORDERED IN ED: Medications  oxyCODONE-acetaminophen (PERCOCET/ROXICET) 5-325 MG per tablet 1 tablet (1 tablet Oral Given 02/09/22 1749)     IMPRESSION / MDM / ASSESSMENT AND PLAN / ED COURSE  I reviewed the triage vital signs and the  nursing notes.                              Differential diagnosis includes, but is not limited to, fracture.  However on examination she has a lot of warmth and some brawniness noted to the inner leg and I suspect that she could have a cellulitis.  I ordered CT imaging to look for any evidence of fracture that was being missed.  These are negative.  I think we should get MRI given her significant pain and repeat ultrasound to make sure no missed DVT.  Given her extent of pain and the concern for possible cellulitis I will discuss with hospital team for admission.  She is got good distal pulse   White count is normal Lipase is normal CMP shows elevated kidney function but patient is a ESRD X-ray without any fracture      FINAL CLINICAL IMPRESSION(S) / ED DIAGNOSES   Final diagnoses:  Left leg cellulitis  ESRD (end stage renal disease) ([place])     Rx / DC Orders   ED Discharge Orders     None        Note:  This document was prepared using Dragon voice recognition software and may include unintentional dictation errors.   Terry [place],  MD 02/09/22 2158

## 2022-02-09 NOTE — Assessment & Plan Note (Addendum)
CT showed no acute bony injury.MRI showed possible abscess versus hematoma. [place] did not show any fluid collection.  CT showed features of myositis/cellulitis

## 2022-02-09 NOTE — ED Notes (Signed)
First Nurse Note:  Pt brought over from Dr. Tonette Bihari office. Pt was seen here recently for leg pain. Pt also saw vascular. Pt went back to Dr. Tonette Bihari office today. Per Dr. Ouida Sills pts leg is swollen and very painful, they tried to order an out patient CT of pts leg but insurance would not approve it so pt was sent here for imaging. Pt crying loudly upon arrival to the ED.

## 2022-02-09 NOTE — ED Notes (Signed)
MRI placed pts earrings in cup and placed in pts purse at this time.

## 2022-02-09 NOTE — Assessment & Plan Note (Signed)
Sliding scale insulin coverage for now. 

## 2022-02-09 NOTE — Assessment & Plan Note (Signed)
Patient has history of CABG Continue aspirin, atorvastatin, carvedilol and Imdur

## 2022-02-09 NOTE — ED Notes (Signed)
Pt reported feeling like her blood sugar was dropping. CBG checked and found to be 69. Pt provided with crackers, peanut butter, and juice. Okay to eat per Dr. Jari Pigg.

## 2022-02-09 NOTE — Assessment & Plan Note (Signed)
Currently euvolemic Continue furosemide, carvedilol, Imdur

## 2022-02-10 ENCOUNTER — Observation Stay: Payer: Medicare HMO

## 2022-02-10 DIAGNOSIS — M25462 Effusion, left knee: Secondary | ICD-10-CM | POA: Diagnosis not present

## 2022-02-10 DIAGNOSIS — N2581 Secondary hyperparathyroidism of renal origin: Secondary | ICD-10-CM | POA: Diagnosis present

## 2022-02-10 DIAGNOSIS — I700 Atherosclerosis of aorta: Secondary | ICD-10-CM | POA: Diagnosis present

## 2022-02-10 DIAGNOSIS — E1142 Type 2 diabetes mellitus with diabetic polyneuropathy: Secondary | ICD-10-CM | POA: Diagnosis present

## 2022-02-10 DIAGNOSIS — E1151 Type 2 diabetes mellitus with diabetic peripheral angiopathy without gangrene: Secondary | ICD-10-CM | POA: Diagnosis present

## 2022-02-10 DIAGNOSIS — L02416 Cutaneous abscess of left lower limb: Secondary | ICD-10-CM | POA: Diagnosis present

## 2022-02-10 DIAGNOSIS — I5022 Chronic systolic (congestive) heart failure: Secondary | ICD-10-CM | POA: Diagnosis present

## 2022-02-10 DIAGNOSIS — I132 Hypertensive heart and chronic kidney disease with heart failure and with stage 5 chronic kidney disease, or end stage renal disease: Secondary | ICD-10-CM | POA: Diagnosis present

## 2022-02-10 DIAGNOSIS — M7989 Other specified soft tissue disorders: Secondary | ICD-10-CM | POA: Diagnosis not present

## 2022-02-10 DIAGNOSIS — Z20822 Contact with and (suspected) exposure to covid-19: Secondary | ICD-10-CM | POA: Diagnosis present

## 2022-02-10 DIAGNOSIS — L03116 Cellulitis of left lower limb: Secondary | ICD-10-CM | POA: Diagnosis present

## 2022-02-10 DIAGNOSIS — I2510 Atherosclerotic heart disease of native coronary artery without angina pectoris: Secondary | ICD-10-CM | POA: Diagnosis present

## 2022-02-10 DIAGNOSIS — Z8616 Personal history of COVID-19: Secondary | ICD-10-CM | POA: Diagnosis not present

## 2022-02-10 DIAGNOSIS — M79652 Pain in left thigh: Secondary | ICD-10-CM | POA: Diagnosis not present

## 2022-02-10 DIAGNOSIS — D631 Anemia in chronic kidney disease: Secondary | ICD-10-CM | POA: Diagnosis present

## 2022-02-10 DIAGNOSIS — H43811 Vitreous degeneration, right eye: Secondary | ICD-10-CM | POA: Diagnosis present

## 2022-02-10 DIAGNOSIS — E669 Obesity, unspecified: Secondary | ICD-10-CM | POA: Diagnosis present

## 2022-02-10 DIAGNOSIS — E1122 Type 2 diabetes mellitus with diabetic chronic kidney disease: Secondary | ICD-10-CM | POA: Diagnosis present

## 2022-02-10 DIAGNOSIS — E785 Hyperlipidemia, unspecified: Secondary | ICD-10-CM | POA: Diagnosis present

## 2022-02-10 DIAGNOSIS — J449 Chronic obstructive pulmonary disease, unspecified: Secondary | ICD-10-CM | POA: Diagnosis present

## 2022-02-10 DIAGNOSIS — Z6831 Body mass index (BMI) 31.0-31.9, adult: Secondary | ICD-10-CM | POA: Diagnosis not present

## 2022-02-10 DIAGNOSIS — G8929 Other chronic pain: Secondary | ICD-10-CM | POA: Diagnosis present

## 2022-02-10 DIAGNOSIS — Z992 Dependence on renal dialysis: Secondary | ICD-10-CM | POA: Diagnosis not present

## 2022-02-10 DIAGNOSIS — Z91013 Allergy to seafood: Secondary | ICD-10-CM | POA: Diagnosis not present

## 2022-02-10 DIAGNOSIS — N186 End stage renal disease: Secondary | ICD-10-CM | POA: Diagnosis present

## 2022-02-10 DIAGNOSIS — M1712 Unilateral primary osteoarthritis, left knee: Secondary | ICD-10-CM | POA: Diagnosis not present

## 2022-02-10 DIAGNOSIS — E1169 Type 2 diabetes mellitus with other specified complication: Secondary | ICD-10-CM | POA: Diagnosis present

## 2022-02-10 DIAGNOSIS — Z951 Presence of aortocoronary bypass graft: Secondary | ICD-10-CM | POA: Diagnosis not present

## 2022-02-10 DIAGNOSIS — M1612 Unilateral primary osteoarthritis, left hip: Secondary | ICD-10-CM | POA: Diagnosis not present

## 2022-02-10 LAB — CBG MONITORING, ED
Glucose-Capillary: 71 mg/dL (ref 70–99)
Glucose-Capillary: 77 mg/dL (ref 70–99)
Glucose-Capillary: 133 mg/dL — ABNORMAL HIGH (ref 70–99)

## 2022-02-10 LAB — HEPATITIS B SURFACE ANTIGEN: Hepatitis B Surface Ag: NONREACTIVE

## 2022-02-10 LAB — GLUCOSE, CAPILLARY: Glucose-Capillary: 108 mg/dL — ABNORMAL HIGH (ref 70–99)

## 2022-02-10 LAB — RESP PANEL BY RT-PCR (FLU A&B, COVID) ARPGX2
Influenza A by PCR: NEGATIVE
Influenza B by PCR: NEGATIVE
SARS Coronavirus 2 by RT PCR: NEGATIVE

## 2022-02-10 LAB — HIV ANTIBODY (ROUTINE TESTING W REFLEX): HIV Screen 4th Generation wRfx: NONREACTIVE

## 2022-02-10 LAB — HEPATITIS B SURFACE ANTIBODY,QUALITATIVE: Hep B S Ab: REACTIVE — AB

## 2022-02-10 LAB — MRSA NEXT GEN BY PCR, NASAL: MRSA by PCR Next Gen: NOT DETECTED

## 2022-02-10 IMAGING — US US EXTREM LOW*L* LIMITED
1 series · 3 of 3 positions shown · non-contrast
Comparison: MRI [DATE]

CLINICAL DATA: 60-year-old with left leg pain. Concern for a fluid
collection in the left thigh vastus medialis. Evaluate for
percutaneous aspiration.

EXAM:
ULTRASOUND LEFT LOWER EXTREMITY LIMITED
TECHNIQUE: Ultrasound examination of the lower extremity soft tissues was
performed in the area of clinical concern.

[Series 1: us extrem low*left* limited · 0.13mm/px · 3 of 3 slices shown]
[im 1/3]
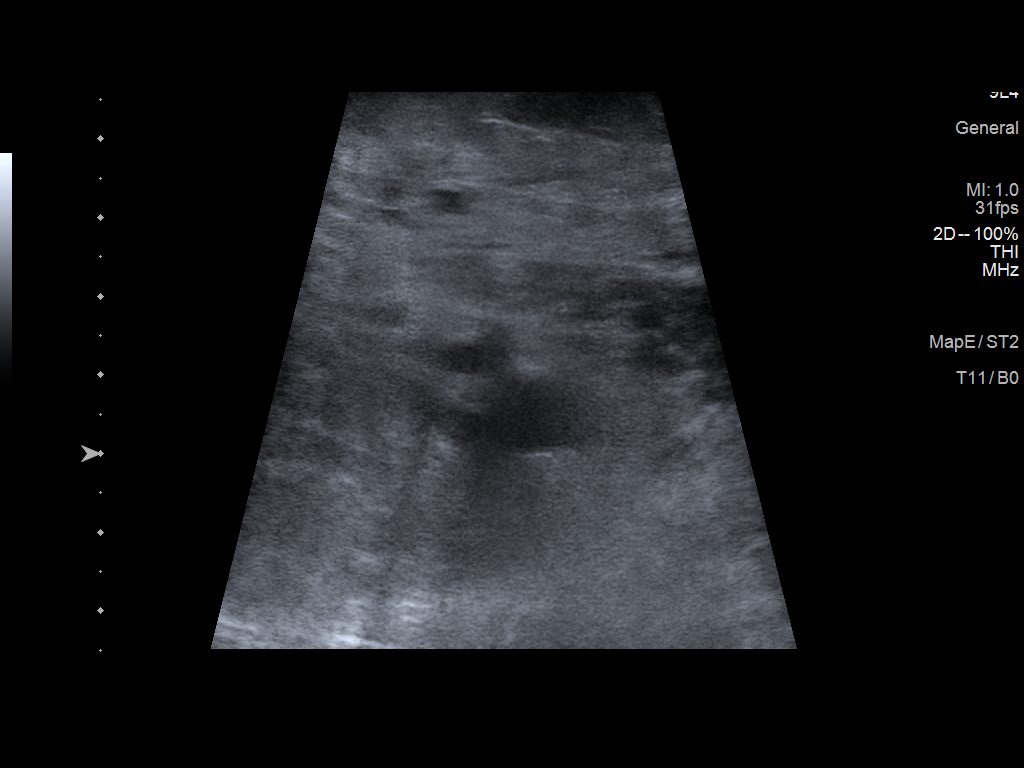
[im 2/3]
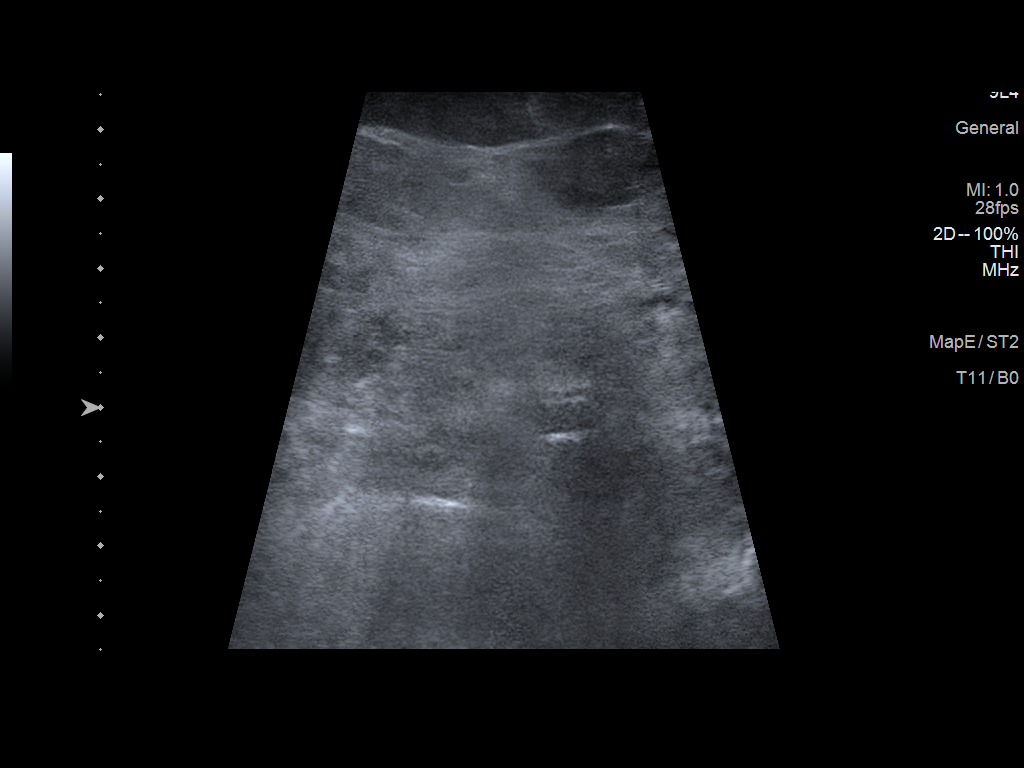
[im 3/3]
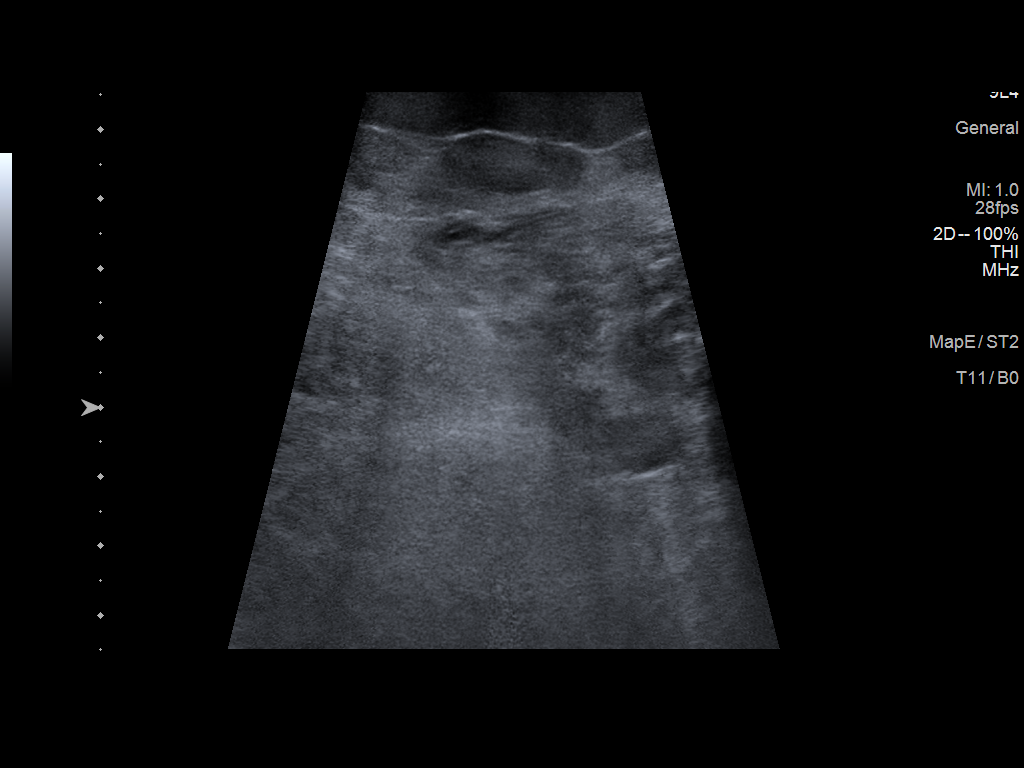

[3 of 3 positions shown; findings below may reference images not displayed]

FINDINGS: The area of concern is the medial left thigh. The area of concern is
just medial and posterior to the left femoral vessels. No discrete
fluid collection was identified in this area. It was very difficult
to compress the soft tissues or the blood vessels in this area due
to patient's discomfort.
IMPRESSION: No discrete fluid collection identified at the area of concern in
the medial left thigh. Aspiration not performed.

## 2022-02-10 MED ORDER — TOPIRAMATE 25 MG PO TABS
25.0000 mg | ORAL_TABLET | Freq: Every day | ORAL | Status: DC
  Administered 2022-02-10 – 2022-02-16 (×7): 25 mg via ORAL
  Filled 2022-02-10 (×7): qty 1

## 2022-02-10 MED ORDER — HYDROCODONE-ACETAMINOPHEN 10-325 MG PO TABS
1.0000 | ORAL_TABLET | Freq: Four times a day (QID) | ORAL | Status: DC | PRN
  Administered 2022-02-10 – 2022-02-16 (×12): 1 via ORAL
  Filled 2022-02-10 (×15): qty 1

## 2022-02-10 MED ORDER — HEPARIN SODIUM (PORCINE) 1000 UNIT/ML DIALYSIS
1000.0000 [IU] | INTRAMUSCULAR | Status: DC | PRN
  Filled 2022-02-10: qty 1

## 2022-02-10 MED ORDER — PANTOPRAZOLE SODIUM 40 MG PO TBEC
40.0000 mg | DELAYED_RELEASE_TABLET | Freq: Every day | ORAL | Status: DC
Start: 2022-02-10 — End: 2022-02-16
  Administered 2022-02-10 – 2022-02-16 (×7): 40 mg via ORAL
  Filled 2022-02-10 (×7): qty 1

## 2022-02-10 MED ORDER — CHLORHEXIDINE GLUCONATE CLOTH 2 % EX PADS
6.0000 | MEDICATED_PAD | Freq: Every day | CUTANEOUS | Status: DC
  Administered 2022-02-12 – 2022-02-16 (×5): 6 via TOPICAL
  Filled 2022-02-10 (×2): qty 6

## 2022-02-10 MED ORDER — VANCOMYCIN HCL 2000 MG/400ML IV SOLN
2000.0000 mg | Freq: Once | INTRAVENOUS | Status: DC
  Filled 2022-02-10: qty 400

## 2022-02-10 MED ORDER — HYDROMORPHONE HCL 1 MG/ML IJ SOLN
1.0000 mg | INTRAMUSCULAR | Status: DC | PRN
  Administered 2022-02-10: 1 mg via INTRAVENOUS
  Filled 2022-02-10: qty 1

## 2022-02-10 MED ORDER — ISOSORBIDE MONONITRATE ER 30 MG PO TB24
30.0000 mg | ORAL_TABLET | Freq: Every day | ORAL | Status: DC
Start: 2022-02-10 — End: 2022-02-16
  Administered 2022-02-10 – 2022-02-16 (×5): 30 mg via ORAL
  Filled 2022-02-10 (×6): qty 1

## 2022-02-10 MED ORDER — LIDOCAINE HCL (PF) 1 % IJ SOLN: 5.0000 mL | INTRAMUSCULAR | Status: DC | PRN

## 2022-02-10 MED ORDER — AMLODIPINE BESYLATE 10 MG PO TABS
10.0000 mg | ORAL_TABLET | Freq: Every day | ORAL | Status: DC
  Administered 2022-02-10 – 2022-02-16 (×5): 10 mg via ORAL
  Filled 2022-02-10 (×4): qty 1
  Filled 2022-02-10: qty 2
  Filled 2022-02-10: qty 1

## 2022-02-10 MED ORDER — PENTAFLUOROPROP-TETRAFLUOROETH EX AERO
1.0000 "application " | INHALATION_SPRAY | CUTANEOUS | Status: DC | PRN
  Filled 2022-02-10: qty 30

## 2022-02-10 MED ORDER — ALTEPLASE 2 MG IJ SOLR
2.0000 mg | Freq: Once | INTRAMUSCULAR | Status: DC | PRN
  Filled 2022-02-10: qty 2

## 2022-02-10 MED ORDER — NALOXONE HCL 0.4 MG/ML IJ SOLN: 0.4000 mg | INTRAMUSCULAR | Status: DC | PRN

## 2022-02-10 MED ORDER — SODIUM CHLORIDE 0.9 % IV SOLN: 100.0000 mL | INTRAVENOUS | Status: DC | PRN

## 2022-02-10 MED ORDER — CARVEDILOL 25 MG PO TABS
25.0000 mg | ORAL_TABLET | Freq: Two times a day (BID) | ORAL | Status: DC
Start: 2022-02-10 — End: 2022-02-16
  Administered 2022-02-10 – 2022-02-16 (×11): 25 mg via ORAL
  Filled 2022-02-10 (×2): qty 1
  Filled 2022-02-10: qty 4
  Filled 2022-02-10 (×9): qty 1

## 2022-02-10 MED ORDER — CEFEPIME HCL 1 G IJ SOLR
1.0000 g | INTRAMUSCULAR | Status: DC
  Administered 2022-02-11: 1 g via INTRAVENOUS
  Filled 2022-02-10 (×3): qty 1

## 2022-02-10 MED ORDER — VANCOMYCIN HCL 2000 MG/400ML IV SOLN
2000.0000 mg | Freq: Once | INTRAVENOUS | Status: AC
  Administered 2022-02-10: 2000 mg via INTRAVENOUS
  Filled 2022-02-10: qty 400

## 2022-02-10 MED ORDER — NALOXONE HCL 0.4 MG/ML IJ SOLN
INTRAMUSCULAR | Status: AC
  Filled 2022-02-10: qty 1

## 2022-02-10 MED ORDER — ACETAMINOPHEN 325 MG PO TABS
ORAL_TABLET | ORAL | Status: AC
  Filled 2022-02-10: qty 2

## 2022-02-10 MED ORDER — FUROSEMIDE 40 MG PO TABS
80.0000 mg | ORAL_TABLET | ORAL | Status: DC
  Administered 2022-02-13 – 2022-02-16 (×3): 80 mg via ORAL
  Filled 2022-02-10 (×3): qty 2

## 2022-02-10 MED ORDER — ASPIRIN EC 81 MG PO TBEC
81.0000 mg | DELAYED_RELEASE_TABLET | Freq: Every day | ORAL | Status: DC
  Administered 2022-02-10 – 2022-02-16 (×7): 81 mg via ORAL
  Filled 2022-02-10 (×7): qty 1

## 2022-02-10 MED ORDER — INSULIN ASPART 100 UNIT/ML IJ SOLN
0.0000 [IU] | Freq: Three times a day (TID) | INTRAMUSCULAR | Status: DC
  Administered 2022-02-10 – 2022-02-11 (×2): 2 [IU] via SUBCUTANEOUS
  Administered 2022-02-12: 3 [IU] via SUBCUTANEOUS
  Administered 2022-02-13 – 2022-02-16 (×3): 2 [IU] via SUBCUTANEOUS
  Filled 2022-02-10 (×6): qty 1

## 2022-02-10 MED ORDER — SUCRALFATE 1 G PO TABS
1.0000 g | ORAL_TABLET | Freq: Two times a day (BID) | ORAL | Status: DC
  Administered 2022-02-10 – 2022-02-15 (×7): 1 g via ORAL
  Filled 2022-02-10 (×9): qty 1

## 2022-02-10 MED ORDER — LIDOCAINE-PRILOCAINE 2.5-2.5 % EX CREA: 1.0000 "application " | TOPICAL_CREAM | CUTANEOUS | Status: DC | PRN

## 2022-02-10 MED ORDER — ATORVASTATIN CALCIUM 20 MG PO TABS
80.0000 mg | ORAL_TABLET | Freq: Every day | ORAL | Status: DC
  Administered 2022-02-10 – 2022-02-15 (×6): 80 mg via ORAL
  Filled 2022-02-10 (×6): qty 4

## 2022-02-10 MED ORDER — MIRTAZAPINE 15 MG PO TABS
30.0000 mg | ORAL_TABLET | Freq: Every day | ORAL | Status: DC
Start: 2022-02-10 — End: 2022-02-16
  Administered 2022-02-10 – 2022-02-15 (×6): 30 mg via ORAL
  Filled 2022-02-10 (×6): qty 2

## 2022-02-10 MED ORDER — LORAZEPAM 0.5 MG PO TABS: 0.5000 mg | ORAL_TABLET | Freq: Once | ORAL | Status: DC

## 2022-02-10 NOTE — Consult Note (Addendum)
Pharmacy Antibiotic Note  Terry Arroyo is a 61 y.o. female w/ h/o ESRD-HD (MWF), sysCHF, HTN, T2DM, MDD, COPD who presented with pain on the left thigh admitted on 02/09/2022 with sepsis (cellulitis). CT of left femur, No acute abnormalities. Pharmacy has been consulted for Vancomycin & cefepime dosing for image c/w cellulitis w/ abscess vs hematoma.  Plan:  Pt has PTA ESRD-HD MWF (follow up to ensure schedule while inpatient) Cefepime 1g IV q24 (given post-HD on HD days) Vancomycin 2g x1 loading dose; followed by 750mg  supplemental doses after each HD session. Check Vanc Tr at steady state if continued F/u cultures and MRSA PCR.    Temp (24hrs), Avg:98.5 F (36.9 C), Min:98.2 F (36.8 C), Max:99 F (37.2 C)  Recent Labs  Lab 02/09/22 1716  WBC 8.9  CREATININE 6.18*    Estimated Creatinine Clearance: 9.9 mL/min (A) (by C-G formula based on SCr of 6.18 mg/dL (H)).    Allergies  Allergen Reactions   Hydralazine Hives, Shortness Of Breath, Swelling and Rash    Body aches    Kiwi Extract Hives, Swelling and Rash    Mouth swells    Lisinopril Swelling    Angioedema   Shellfish Allergy Anaphylaxis    Shrimp/lobster  Betadine leaves welts on skin     Betadine [Povidone Iodine] Hives and Rash    LEAVES WELTS ON SKIN   Metformin Diarrhea and Other (See Comments)    GI Intolerance   Tape Itching    Use paper tape whenever possible    Antimicrobials this admission: Ceftriaxone x1 in ED (2/22) VAN/CFP (2/22 >>   Dose adjustments this admission: CTM ESRD-HD inpatient schedule. PTA was MWF  Microbiology results: 2/22 BCx: sent 2/22 MRSA PCR: ordered  Thank you for allowing pharmacy to be a part of this patients care.  Lorna Dibble 02/10/2022 12:37 PM

## 2022-02-10 NOTE — ED Notes (Signed)
Patient given 8 oz of orange juice to increase blood sugar.

## 2022-02-10 NOTE — Progress Notes (Signed)
Patient ID: Terry Arroyo, female   DOB: 1961-11-07, 61 y.o.   MRN: [id number]  Evaluated left medial thigh with ultrasound.  Do not see a fluid collection at the area of concern.  Diffuse soft tissue edema in the left thigh.  Cannot compress soft tissues or blood vessels in left thigh due to patient's discomfort.  Aspiration not performed.

## 2022-02-10 NOTE — Progress Notes (Addendum)
° °      CROSS COVER NOTE  NAME: Terry Arroyo MRN: [id number] DOB : 1961-10-04   Secure chat received from RN requesting Narcan. Patient received Norco at [place] at 2007, she became unresponsive ~5 mins after dilaudid administration. She maintained good respiratory effort. Narcan 0.4 mg given x1 and patient immediately opened eyes and is now alert and conversant with staff. Dilaudid discontinued.  Neomia Glass MHA, MSN, FNP-BC Nurse Practitioner [facility] Pager [phone]

## 2022-02-10 NOTE — Progress Notes (Signed)
[facility]  ROUNDING NOTE   Subjective:   Terry Arroyo is a 61 year old female with past medical history including type 2 diabetes with polyneuropathy, depression, angioedema, heart failure, hypertension, and end-stage renal disease on dialysis.  Patient presents to the emergency department with extreme left thigh pain.  She will be admitted for ESRD (end stage renal disease) ([place]) [N18.6] Left leg cellulitis [L03.116] Abscess of left thigh [L02.416] Acute pain of left thigh [M79.652]  Patient is known to our practice and receives outpatient dialysis treatments at [facility] on a MWF schedule, supervised by Dr. Juleen China.  Last treatment received on Monday.  Patient reports receiving full, uncomplicated treatment.  Patient states she suffers from chronic back pain.  Patient states she has been complaining of left leg and foot pain over the past few weeks.  States she fell vascular on 02/05/2022 and was found to have stable arthrosclerosis with claudication but felt the pain was not vascular related.  She states she has foot numbness and tingling and that has progressed up her leg.  Patient lying on stretcher, very tearful due to pain.  Patient seen later in morning, more calm due to pain control at that time.  States her left thigh is sore to touch and is visibly more swollen than right thigh.  Denies nausea and vomiting.  Denies shortness of breath and cough.  Denies known fever or chills.   CT renal stone negative for hydronephrosis.  CT femur negative for bony abnormalities.  Left femur MRI shows diffuse edema and focal collection within the vastus medialis muscle concerning for hematoma or abscess.  We have been consulted to manage dialysis needs during this admission   Objective:  Vital signs in last 24 hours:  Temp:  [98.2 F (36.8 C)-99 F (37.2 C)] 99 F (37.2 C) (02/22 1549) Pulse Rate:  [54-72] 64 (02/22 1549) Resp:  [20-22] 21 (02/22 1549) BP:  (141-172)/(73-94) 172/77 (02/22 1300) SpO2:  [95 %-100 %] 100 % (02/22 1549) Weight:  [80.6 kg] 80.6 kg (02/22 1549)  Weight change:  Filed Weights   02/10/22 1549  Weight: 80.6 kg    Intake/Output: I/O last 3 completed shifts: In: 96.9 [IV Piggyback:96.9] Out: -    Intake/Output this shift:  No intake/output data recorded.  Physical Exam: General: Tearful  Head: Normocephalic, atraumatic. Moist oral mucosal membranes  Eyes: Anicteric  Lungs:  Clear to auscultation, normal effort, room air  Heart: Regular rate and rhythm  Abdomen:  Soft, nontender, nondistended  Extremities: Left thigh edema  Neurologic: Alert, moving all four extremities  Skin: No lesions  Access: Right aVF    Basic Metabolic Panel: Recent Labs  Lab 02/09/22 1716  NA 136  K 4.5  CL 94*  CO2 27  GLUCOSE 104*  BUN 33*  CREATININE 6.18*  CALCIUM 8.8*    Liver Function Tests: Recent Labs  Lab 02/09/22 1716  AST 20  ALT 12  ALKPHOS 78  BILITOT 1.7*  PROT 8.1  ALBUMIN 3.7   Recent Labs  Lab 02/09/22 1716  LIPASE 25   No results for input(s): AMMONIA in the last 168 hours.  CBC: Recent Labs  Lab 02/09/22 1716  WBC 8.9  HGB 12.5  HCT 40.6  MCV 91.2  PLT 279    Cardiac Enzymes: No results for input(s): CKTOTAL, CKMB, CKMBINDEX, TROPONINI in the last 168 hours.  BNP: Invalid input(s): POCBNP  CBG: Recent Labs  Lab 02/09/22 2247 02/10/22 0256 02/10/22 0428 02/10/22 1238  GLUCAP 69* 71 50 133*    Microbiology: Results for orders placed or performed during the hospital encounter of 11/04/21  Urine Culture     Status: None   Collection Time: 11/04/21 12:35 AM   Specimen: Urine, Random  Result Value Ref Range Status   Specimen Description   Final    URINE, RANDOM Performed at [facility], [address], [place], [place] [address]    Special Requests   Final    NONE Performed at [facility], [address], [place], [place] [address]     Culture   Final    NO GROWTH Performed at [facility], [place] [address], [place],  [address]    Report Status 11/05/2021 FINAL  Final    Coagulation Studies: No results for input(s): LABPROT, INR in the last 72 hours.  Urinalysis: No results for input(s): COLORURINE, LABSPEC, PHURINE, GLUCOSEU, HGBUR, BILIRUBINUR, KETONESUR, PROTEINUR, UROBILINOGEN, NITRITE, LEUKOCYTESUR in the last 72 hours.  Invalid input(s): APPERANCEUR    Imaging: CT FEMUR LEFT WO CONTRAST  Result Date: 02/09/2022 CLINICAL DATA:  Left leg pain and hip pain, initial encounter EXAM: CT OF THE LOWER LEFT EXTREMITY WITHOUT CONTRAST TECHNIQUE: Multidetector CT imaging of the lower left extremity was performed according to the standard protocol. RADIATION DOSE REDUCTION: This exam was performed according to the departmental dose-optimization program which includes automated exposure control, adjustment of the mA and/or kV according to patient size and/or use of iterative reconstruction technique. COMPARISON:  Plain film from earlier the same day FINDINGS: Bones/Joint/Cartilage Degenerative changes of the left sacroiliac joint are noted. No acute fracture or dislocation is noted pelvic bony structures appear within normal limits. Ligaments Suboptimally assessed by CT. Muscles and Tendons Surrounding musculature appears within normal limits. Soft tissues Lower extremity edema is noted throughout the thigh particularly in the mid and distal thigh. No focal hematoma is seen. Atherosclerotic calcifications are noted. IMPRESSION: No acute bony abnormality is noted. Lower extremity edema is noted. Electronically Signed   By: Inez Catalina M.D.   On: 02/09/2022 21:19   MR FEMUR LEFT WO CONTRAST  Result Date: 02/10/2022 CLINICAL DATA:  Left lower extremity swelling and pain. EXAM: MR OF THE LEFT FEMUR WITHOUT CONTRAST TECHNIQUE: Multiplanar, multisequence MR imaging of the left thigh was performed. No intravenous contrast was  administered. COMPARISON:  Left femoral series earlier today, left hip series 02/02/2022 FINDINGS: Bones/Joint/Cartilage There is no evidence of fractures. Partial joint space loss is again noted at the hips symmetrically. There is mild symmetric nonerosive degenerative arthrosis at the knees, with symmetric small suprapatellar bursal effusions. There are small amounts of fluid symmetrically in the hip joint spaces possibly physiologic or could be due to minimal symmetric joint effusions. Ligaments Not well evaluated given nondedicated technique. Grossly intact as far seen. There is 1 cm of lateral patellar drift on the left which could indicate medial patellofemoral ligament laxity, but the ligament itself is intact. Muscles and Tendons, soft tissues There is diffuse edema primarily in the anterior compartment musculature, most notably in the gracilis muscle, vastus medialis, adductor longus, the sartorius muscle, and the medial aspect of the vastus intermedius. There is additional edema in the overlying subcutaneous plane anteriorly and medially, and scattered fluid collecting along the fat muscle interfaces in the thigh laterally, medially and anteriorly. There is fluid extending along the intermuscular septa as well, primarily in the anterior compartment. There is a small intramuscular collection in the vastus medialis measuring 2.9 x 1.9 cm  with peripheral low signal intensity and small areas of signal dropout within it, which could be a hematoma or abscess. No other focal fluid collections are observed. Superficial edema continues into the foreleg without further intramuscular edema. Visualized portions of the left hemipelvis are unremarkable. IMPRESSION: 1. No evidence of fractures. 2. Diffuse edema primarily involving the anterior compartment musculature of the left thigh, with overlying edema in the subcutaneous plane and nonlocalizing fluid along fat muscle interfaces and intermuscular septa. 3. There is a  2.9 x 1.9 cm focal collection within the vastus medialis which could be an intramuscular hematoma or abscess. 4. The primary differential considerations would be an infectious process with multifocal myositis/pyomyositis with overlying cellulitis, or edema due to a traumatic etiology. Certainly a necrotizing fasciitis/compartment syndrome is not excluded. 5. Mild degenerative changes of the knee joints and hip joints, with small amounts of symmetric bilateral uncomplicated hip joint and suprapatellar bursal fluid. Electronically Signed   By: Telford Nab M.D.   On: 02/10/2022 00:56   US Venous Img Lower Unilateral Left  Result Date: 02/09/2022 CLINICAL DATA:  Left leg pain EXAM: Left LOWER EXTREMITY VENOUS DOPPLER ULTRASOUND TECHNIQUE: Gray-scale sonography with compression, as well as color and duplex ultrasound, were performed to evaluate the deep venous system(s) from the level of the common femoral vein through the popliteal and proximal calf veins. COMPARISON:  02/02/2022 FINDINGS: VENOUS Patient would not allow compression views within the upper leg due to pain. Normal compressibility of the popliteal veins, as well as the visualized calf veins. Visualized portions of profunda femoral vein. Suspicion of nonocclusive thrombus within the greater saphenous vein in the upper thigh near the saphenofemoral junction. Doppler waveforms show normal direction of venous flow, normal respiratory plasticity and response to augmentation. Limited views of the contralateral common femoral vein are unremarkable. OTHER Small fluid collection in the popliteal fossa measuring 2 cm. Limitations: Unable to perform compression views from the hip to the knee due to pain IMPRESSION: 1. The examination was limited by inability to perform compression views from the hip to the knee secondary to severe pain. Deep veins appear grossly patent on color flow images with normal waveforms. 2. Short segment of nonocclusive  thrombus/thrombophlebitis within the greater saphenous vein in the upper thigh near the saphenofemoral junction. 3. 2 cm fluid collection in the popliteal fossa Electronically Signed   By: Donavan Foil M.D.   On: 02/09/2022 23:28   [place] LT LOWER EXTREM LTD SOFT TISSUE NON VASCULAR  Result Date: 02/10/2022 CLINICAL DATA:  61 year old with left leg pain. Concern for a fluid collection in the left thigh vastus medialis. Evaluate for percutaneous aspiration. EXAM: ULTRASOUND LEFT LOWER EXTREMITY LIMITED TECHNIQUE: Ultrasound examination of the lower extremity soft tissues was performed in the area of clinical concern. COMPARISON:  MRI 02/09/2022 FINDINGS: The area of concern is the medial left thigh. The area of concern is just medial and posterior to the left femoral vessels. No discrete fluid collection was identified in this area. It was very difficult to compress the soft tissues or the blood vessels in this area due to patient's discomfort. IMPRESSION: No discrete fluid collection identified at the area of concern in the medial left thigh. Aspiration not performed. Electronically Signed   By: Markus Daft M.D.   On: 02/10/2022 15:09   CT Renal Stone Study  Result Date: 02/09/2022 CLINICAL DATA:  Flank pain EXAM: CT ABDOMEN AND PELVIS WITHOUT CONTRAST TECHNIQUE: Multidetector CT imaging of the abdomen and pelvis was performed following  the standard protocol without IV contrast. RADIATION DOSE REDUCTION: This exam was performed according to the departmental dose-optimization program which includes automated exposure control, adjustment of the mA and/or kV according to patient size and/or use of iterative reconstruction technique. COMPARISON:  CT 10/28/2021 FINDINGS: Lower chest: Lung bases show no acute consolidation or effusion. Cardiomegaly. Hepatobiliary: Distended gallbladder without calcified stone. No focal hepatic abnormality or biliary dilatation Pancreas: Unremarkable. No pancreatic ductal dilatation or  surrounding inflammatory changes. Spleen: Normal in size without focal abnormality. Adrenals/Urinary Tract: Adrenal glands are normal. Probable cortical scarring within the bilateral kidneys. No hydronephrosis. Low-density renal lesions incompletely characterized without contrast. The bladder is unremarkable Stomach/Bowel: The stomach is nonenlarged. No dilated small bowel. No acute bowel wall thickening Vascular/Lymphatic: Advanced aortic atherosclerosis. No aneurysm. Mild retroperitoneal lymph nodes measuring up to 11 mm. Reproductive: Uterus unremarkable.  No adnexal mass. Other: Negative for pelvic effusion or free air. Musculoskeletal: No acute osseous abnormality. IMPRESSION: 1. Negative for hydronephrosis or ureteral stone. Probable cortical scarring within the bilateral kidneys. 2. No CT evidence for acute intra-abdominopelvic abnormality. Electronically Signed   By: Donavan Foil M.D.   On: 02/09/2022 21:17   DG Femur Min 2 Views Left  Result Date: 02/09/2022 CLINICAL DATA:  Left-sided leg pain EXAM: LEFT FEMUR 2 VIEWS COMPARISON:  None. FINDINGS: There is no evidence of fracture or other focal bone lesions. Soft tissues are unremarkable. Vascular calcifications. Clips at the medial lower leg. IMPRESSION: No acute osseous abnormality Electronically Signed   By: Donavan Foil M.D.   On: 02/09/2022 18:16     Medications:    sodium chloride     sodium chloride     ceFEPime (MAXIPIME) IV     vancomycin      acetaminophen       amLODipine  10 mg Oral Daily   aspirin EC  81 mg Oral Daily   atorvastatin  80 mg Oral q1800   carvedilol  25 mg Oral BID   Chlorhexidine Gluconate Cloth  6 each Topical Q0600   [START ON 02/11/2022] furosemide  80 mg Oral Once per day on Sun Tue Thu Sat   heparin  5,000 Units Subcutaneous Q8H   insulin aspart  0-15 Units Subcutaneous TID WC   isosorbide mononitrate  30 mg Oral Daily   mirtazapine  30 mg Oral QHS   pantoprazole  40 mg Oral Daily   sucralfate  1 g  Oral BID AC   topiramate  25 mg Oral Daily   sodium chloride, sodium chloride, acetaminophen **OR** acetaminophen, alteplase, heparin, HYDROcodone-acetaminophen, HYDROmorphone (DILAUDID) injection, lidocaine (PF), lidocaine-prilocaine, morphine injection, ondansetron **OR** ondansetron (ZOFRAN) IV, pentafluoroprop-tetrafluoroeth  Assessment/ Plan:  [name] is a 61 y.o.  female past medical history including type 2 diabetes with polyneuropathy, depression, angioedema, heart failure, hypertension, and end-stage renal disease on dialysis.  Patient presents to the emergency department with extreme left thigh pain.  She will be admitted for ESRD (end stage renal disease) ([place]) [N18.6] Left leg cellulitis [L03.116] Abscess of left thigh [L02.416] Acute pain of left thigh [M79.652]  [facility]/MWF/right aVF/76kg  End-stage renal disease on hemodialysis.  Plan to dialyze later today to maintain outpatient schedule.  UF goal 1 as tolerated.  Next treatment scheduled for Friday.  2. Anemia of chronic kidney disease Normocytic Lab Results  Component Value Date   HGB 12.5 02/09/2022  Mircera ordered outpatient Hemoglobin within acceptable range.  3. Secondary Hyperparathyroidism:  Lab Results  Component Value Date  CALCIUM 8.8 (L) 02/09/2022   CAION 1.09 (L) 05/13/2021   PHOS 3.9 10/29/2021    Calcium and phosphorus within acceptable range.  We will continue to monitor during this admission.  4.  Left leg cellulitis withocal collection within the vastus medialis muscle concerning for hematoma or abscess.  Recommend consulting orthopedics for possible aspiration.  Continue pain management and prescribed empiric antibiotic therapy.    LOS: 0 Rockie Vawter 2/22/20233:54 PM

## 2022-02-10 NOTE — Progress Notes (Signed)
PROGRESS NOTE  Terry Arroyo  ION:[id number] DOB: 17-Jun-1961 DOA: 02/09/2022 PCP: Kirk Ruths, MD   Brief Narrative: Patient is a 61 year old female with history of ESRD on dialysis on Monday, Wednesday, Friday, systolic congestive heart failure, hypertension, diabetes type 2, depression, COPD who presented with pain on the left thigh.  On presentation she was hypertensive.  CT left femur did not show any acute abnormality.  Also noted to have lower extremity edema.  Presentation was consistent with cellulitis of the left thigh and was started on antibiotics.  MRI showed diffuse edema primarily involving the anterior compartment musculature of the left thigh,  2.9 x 1.9 cm focal collection within the vastus medialis which could be an intramuscular hematoma or abscess.  Orthopedics consulted today  Assessment & Plan:  Principal Problem:   Acute pain of left thigh Active Problems:   Type 2 diabetes mellitus with hyperlipidemia (HCC)   Essential hypertension   HFrEF (heart failure with reduced ejection fraction) (HCC)   CAD (coronary artery disease)   ESRD on dialysis ([place])   Cellulitis of left thigh   Hx of CABG   Assessment and Plan: * Acute pain of left thigh- (present on admission) Seen by vascular with low suspicion for vascular etiology.  Negative for DVT on 2/14 MRI showed MRI showed diffuse edema primarily involving the anterior compartment musculature of the left thigh,  2.9 x 1.9 cm focal collection within the vastus medialis which could be an intramuscular hematoma or abscess.  Orthopedics consulted today. Continue broad spectrum abx.F/U cultures  Cellulitis of left thigh CT showed no acute bony injury.MRI showed possible abscess versus hematoma.  Orthopedics consulted  ESRD on dialysis [facility]) Nephrology consult for continuation of dialysis  CAD (coronary artery disease)- (present on admission) Patient has history of CABG Continue aspirin, atorvastatin, carvedilol  and Imdur  HFrEF (heart failure with reduced ejection fraction) ([place])- (present on admission) Currently euvolemic Continue furosemide, carvedilol, Imdur  Essential hypertension- (present on admission) Continue amlodipine and carvedilol  Type 2 diabetes mellitus with hyperlipidemia ([place])- (present on admission) Sliding scale insulin coverage for now              DVT prophylaxis:heparin injection 5,000 Units Start: 02/09/22 2300     Code Status: Full Code  Family Communication: None at the bedside  Patient status: Inpatient  Patient is from : Home  Anticipated discharge to: Home  Estimated DC date: Not sure   Consultants: Orthopedics  Procedures: None yet  Antimicrobials:  Anti-infectives (From admission, onward)    Start     Dose/Rate Route Frequency Ordered Stop   02/10/22 2200  cefTRIAXone (ROCEPHIN) 1 g in sodium chloride 0.9 % 100 mL IVPB        1 g 200 mL/hr over 30 Minutes Intravenous Every 24 hours 02/09/22 2246 02/16/22 2159   02/09/22 2200  cefTRIAXone (ROCEPHIN) 2 g in sodium chloride 0.9 % 100 mL IVPB        2 g 200 mL/hr over 30 Minutes Intravenous  Once 02/09/22 2155 02/09/22 2248       Subjective:  Patient seen and examined at the bedside this morning in the emergency department.  Hemodynamically stable.  Complains of severe pain on the left thigh. Objective: Vitals:   02/10/22 0142 02/10/22 0200 02/10/22 0449 02/10/22 0652  BP: (!) 148/94 (!) 141/77 (!) 158/79 (!) 161/76  Pulse: (!) 54 60 66 72  Resp: (!) 22  20 20   Temp: 98.3 F (36.8 C)  98.2  F (36.8 C)   TempSrc: Oral  Oral   SpO2: 99% 100% 100% 100%    Intake/Output Summary (Last 24 hours) at 02/10/2022 1137 Last data filed at 02/09/2022 2248 Gross per 24 hour  Intake 96.91 ml  Output --  Net 96.91 ml   There were no vitals filed for this visit.  Examination:  General exam: Overall comfortable, not in distress, obese HEENT: PERRL Respiratory system:  no wheezes or  crackles  Cardiovascular system: S1 & S2 heard, RRR.  Gastrointestinal system: Abdomen is nondistended, soft and nontender. Central nervous system: Alert and oriented Extremities: Edema of the left thigh, severe tenderness of the left thigh, AV fistula on the right arm Skin: No rashes, no ulcers,no icterus     Data Reviewed: I have personally reviewed following labs and imaging studies  CBC: Recent Labs  Lab 02/09/22 1716  WBC 8.9  HGB 12.5  HCT 40.6  MCV 91.2  PLT 510   Basic Metabolic Panel: Recent Labs  Lab 02/09/22 1716  NA 136  K 4.5  CL 94*  CO2 27  GLUCOSE 104*  BUN 33*  CREATININE 6.18*  CALCIUM 8.8*     No results found for this or any previous visit (from the past 240 hour(s)).   Radiology Studies: CT FEMUR LEFT WO CONTRAST  Result Date: 02/09/2022 CLINICAL DATA:  Left leg pain and hip pain, initial encounter EXAM: CT OF THE LOWER LEFT EXTREMITY WITHOUT CONTRAST TECHNIQUE: Multidetector CT imaging of the lower left extremity was performed according to the standard protocol. RADIATION DOSE REDUCTION: This exam was performed according to the departmental dose-optimization program which includes automated exposure control, adjustment of the mA and/or kV according to patient size and/or use of iterative reconstruction technique. COMPARISON:  Plain film from earlier the same day FINDINGS: Bones/Joint/Cartilage Degenerative changes of the left sacroiliac joint are noted. No acute fracture or dislocation is noted pelvic bony structures appear within normal limits. Ligaments Suboptimally assessed by CT. Muscles and Tendons Surrounding musculature appears within normal limits. Soft tissues Lower extremity edema is noted throughout the thigh particularly in the mid and distal thigh. No focal hematoma is seen. Atherosclerotic calcifications are noted. IMPRESSION: No acute bony abnormality is noted. Lower extremity edema is noted. Electronically Signed   By: Inez Catalina M.D.    On: 02/09/2022 21:19   MR FEMUR LEFT WO CONTRAST  Result Date: 02/10/2022 CLINICAL DATA:  Left lower extremity swelling and pain. EXAM: MR OF THE LEFT FEMUR WITHOUT CONTRAST TECHNIQUE: Multiplanar, multisequence MR imaging of the left thigh was performed. No intravenous contrast was administered. COMPARISON:  Left femoral series earlier today, left hip series 02/02/2022 FINDINGS: Bones/Joint/Cartilage There is no evidence of fractures. Partial joint space loss is again noted at the hips symmetrically. There is mild symmetric nonerosive degenerative arthrosis at the knees, with symmetric small suprapatellar bursal effusions. There are small amounts of fluid symmetrically in the hip joint spaces possibly physiologic or could be due to minimal symmetric joint effusions. Ligaments Not well evaluated given nondedicated technique. Grossly intact as far seen. There is 1 cm of lateral patellar drift on the left which could indicate medial patellofemoral ligament laxity, but the ligament itself is intact. Muscles and Tendons, soft tissues There is diffuse edema primarily in the anterior compartment musculature, most notably in the gracilis muscle, vastus medialis, adductor longus, the sartorius muscle, and the medial aspect of the vastus intermedius. There is additional edema in the overlying subcutaneous plane anteriorly and medially, and scattered  fluid collecting along the fat muscle interfaces in the thigh laterally, medially and anteriorly. There is fluid extending along the intermuscular septa as well, primarily in the anterior compartment. There is a small intramuscular collection in the vastus medialis measuring 2.9 x 1.9 cm with peripheral low signal intensity and small areas of signal dropout within it, which could be a hematoma or abscess. No other focal fluid collections are observed. Superficial edema continues into the foreleg without further intramuscular edema. Visualized portions of the left hemipelvis  are unremarkable. IMPRESSION: 1. No evidence of fractures. 2. Diffuse edema primarily involving the anterior compartment musculature of the left thigh, with overlying edema in the subcutaneous plane and nonlocalizing fluid along fat muscle interfaces and intermuscular septa. 3. There is a 2.9 x 1.9 cm focal collection within the vastus medialis which could be an intramuscular hematoma or abscess. 4. The primary differential considerations would be an infectious process with multifocal myositis/pyomyositis with overlying cellulitis, or edema due to a traumatic etiology. Certainly a necrotizing fasciitis/compartment syndrome is not excluded. 5. Mild degenerative changes of the knee joints and hip joints, with small amounts of symmetric bilateral uncomplicated hip joint and suprapatellar bursal fluid. Electronically Signed   By: Telford Nab M.D.   On: 02/10/2022 00:56   US Venous Img Lower Unilateral Left  Result Date: 02/09/2022 CLINICAL DATA:  Left leg pain EXAM: Left LOWER EXTREMITY VENOUS DOPPLER ULTRASOUND TECHNIQUE: Gray-scale sonography with compression, as well as color and duplex ultrasound, were performed to evaluate the deep venous system(s) from the level of the common femoral vein through the popliteal and proximal calf veins. COMPARISON:  02/02/2022 FINDINGS: VENOUS Patient would not allow compression views within the upper leg due to pain. Normal compressibility of the popliteal veins, as well as the visualized calf veins. Visualized portions of profunda femoral vein. Suspicion of nonocclusive thrombus within the greater saphenous vein in the upper thigh near the saphenofemoral junction. Doppler waveforms show normal direction of venous flow, normal respiratory plasticity and response to augmentation. Limited views of the contralateral common femoral vein are unremarkable. OTHER Small fluid collection in the popliteal fossa measuring 2 cm. Limitations: Unable to perform compression views from the  hip to the knee due to pain IMPRESSION: 1. The examination was limited by inability to perform compression views from the hip to the knee secondary to severe pain. Deep veins appear grossly patent on color flow images with normal waveforms. 2. Short segment of nonocclusive thrombus/thrombophlebitis within the greater saphenous vein in the upper thigh near the saphenofemoral junction. 3. 2 cm fluid collection in the popliteal fossa Electronically Signed   By: Donavan Foil M.D.   On: 02/09/2022 23:28   CT Renal Stone Study  Result Date: 02/09/2022 CLINICAL DATA:  Flank pain EXAM: CT ABDOMEN AND PELVIS WITHOUT CONTRAST TECHNIQUE: Multidetector CT imaging of the abdomen and pelvis was performed following the standard protocol without IV contrast. RADIATION DOSE REDUCTION: This exam was performed according to the departmental dose-optimization program which includes automated exposure control, adjustment of the mA and/or kV according to patient size and/or use of iterative reconstruction technique. COMPARISON:  CT 10/28/2021 FINDINGS: Lower chest: Lung bases show no acute consolidation or effusion. Cardiomegaly. Hepatobiliary: Distended gallbladder without calcified stone. No focal hepatic abnormality or biliary dilatation Pancreas: Unremarkable. No pancreatic ductal dilatation or surrounding inflammatory changes. Spleen: Normal in size without focal abnormality. Adrenals/Urinary Tract: Adrenal glands are normal. Probable cortical scarring within the bilateral kidneys. No hydronephrosis. Low-density renal lesions incompletely characterized without  contrast. The bladder is unremarkable Stomach/Bowel: The stomach is nonenlarged. No dilated small bowel. No acute bowel wall thickening Vascular/Lymphatic: Advanced aortic atherosclerosis. No aneurysm. Mild retroperitoneal lymph nodes measuring up to 11 mm. Reproductive: Uterus unremarkable.  No adnexal mass. Other: Negative for pelvic effusion or free air.  Musculoskeletal: No acute osseous abnormality. IMPRESSION: 1. Negative for hydronephrosis or ureteral stone. Probable cortical scarring within the bilateral kidneys. 2. No CT evidence for acute intra-abdominopelvic abnormality. Electronically Signed   By: Donavan Foil M.D.   On: 02/09/2022 21:17   DG Femur Min 2 Views Left  Result Date: 02/09/2022 CLINICAL DATA:  Left-sided leg pain EXAM: LEFT FEMUR 2 VIEWS COMPARISON:  None. FINDINGS: There is no evidence of fracture or other focal bone lesions. Soft tissues are unremarkable. Vascular calcifications. Clips at the medial lower leg. IMPRESSION: No acute osseous abnormality Electronically Signed   By: Donavan Foil M.D.   On: 02/09/2022 18:16    Scheduled Meds:  amLODipine  10 mg Oral Daily   aspirin  81 mg Oral Daily   atorvastatin  80 mg Oral q1800   carvedilol  25 mg Oral BID   Chlorhexidine Gluconate Cloth  6 each Topical Q0600   [START ON 02/11/2022] furosemide  80 mg Oral Once per day on Sun Tue Thu Sat   heparin  5,000 Units Subcutaneous Q8H   insulin aspart  0-15 Units Subcutaneous TID WC   isosorbide mononitrate  30 mg Oral Daily   mirtazapine  30 mg Oral QHS   pantoprazole  40 mg Oral Daily   sucralfate  1 g Oral BID AC   topiramate  25 mg Oral Daily   Continuous Infusions:  sodium chloride     sodium chloride     cefTRIAXone (ROCEPHIN)  IV       LOS: 0 days   Shelly Coss, MD [facility] P2/22/2023, 11:37 AM

## 2022-02-10 NOTE — Consult Note (Addendum)
Radiology did not see a fluid collection. Recommend continued antibiotics and blood cultures. There is currently no indication for surgery without evidence of fluid collection.   ORTHOPAEDIC CONSULTATION  REQUESTING PHYSICIAN: Shelly Coss, MD  Chief Complaint: left thigh pain  HPI: Terry Arroyo is a 61 y.o. female who complains of left thigh pain for several weeks. The pain is sharp in character. The pain is severe and 10/10. The pain is worse with movement and better with rest. Denies any numbness, tingling or constitutional symptoms.  She has a history of ESRD on dialysis on Monday, Wednesday, Friday, systolic congestive heart failure, hypertension, diabetes type 2, depression and COPD.   Past Medical History:  Diagnosis Date   Anemia    Angina at rest [facility])    Angioedema 03/20/2021   Lisinopril   Anterior cerebral aneurysm    approximately 5 mm; left MCA    Anxiety    Aortic atherosclerosis (HCC)    Asthma    Blind left eye    COPD (chronic obstructive pulmonary disease) (HCC)    Coronary artery disease    Depression    DOE (dyspnea on exertion)    ESRD (end stage renal disease) (HCC)    Gait instability    Genital herpes    GERD (gastroesophageal reflux disease)    HFrEF (heart failure with reduced ejection fraction) ([place])    History of 2019 novel coronavirus disease (COVID-19) 03/2019   [place]   History of DVT (deep vein thrombosis)    History of DVT (deep vein thrombosis)    Hx of CABG 2018   x 4; performed in [place]   Hyperlipidemia    Hypertension    MI (myocardial infarction) ([place]) 2018   no stents   Pituitary adenoma ([place])    PVD (posterior vitreous detachment), right eye    Secondary hyperparathyroidism ([place])    Sleep apnea    non-compliant with nocturnal PAP therapy   Stroke [facility]) 2014   stated affected left side.   T2DM (type 2 diabetes mellitus) [facility])    Past Surgical History:  Procedure Laterality Date   A/V  FISTULAGRAM Right 12/29/2021   Procedure: A/V FISTULAGRAM;  Surgeon: Katha Cabal, MD;  Location: [place] [facility];  Service: Cardiovascular;  Laterality: Right;   AV FISTULA PLACEMENT Right 01/28/2021   Procedure: ARTERIOVENOUS (AV) FISTULA CREATION;  Surgeon: Katha Cabal, MD;  Location: [facility] ORS;  Service: Vascular;  Laterality: Right;   AV FISTULA PLACEMENT Right 05/13/2021   Procedure: INSERTION OF ARTERIOVENOUS (AV) GORE-TEX GRAFT ARM ( BRACHIAL AXILLARY );  Surgeon: Katha Cabal, MD;  Location: [facility] ORS;  Service: Vascular;  Laterality: Right;   [place]   pituitary tumor   BREAST EXCISIONAL BIOPSY Left yrs ago   benign   CESAREAN SECTION     CORONARY ARTERY BYPASS GRAFT  2018   x 4 vessels; performed in [place] N/A 11/07/2020   Procedure: DIALYSIS/PERMA CATHETER INSERTION;  Surgeon: Algernon Huxley, MD;  Location: [place] [facility];  Service: Cardiovascular;  Laterality: N/A;   DIALYSIS/PERMA CATHETER REMOVAL N/A 06/12/2021   Procedure: DIALYSIS/PERMA CATHETER REMOVAL;  Surgeon: Algernon Huxley, MD;  Location: [place] [facility];  Service: Cardiovascular;  Laterality: N/A;   ESOPHAGOGASTRODUODENOSCOPY (EGD) WITH PROPOFOL N/A 12/22/2021   Procedure: ESOPHAGOGASTRODUODENOSCOPY (EGD) WITH PROPOFOL;  Surgeon: Lesly Rubenstein, MD;  Location: [facility] ENDOSCOPY;  Service: Gastroenterology;  Laterality:  N/A;  IDDM   EYE SURGERY Right 2021   cataracts    PERIPHERAL VASCULAR THROMBECTOMY Right 04/03/2021   Procedure: A/V Fistulagram ;  Surgeon: Katha Cabal, MD;  Location: [place] [facility];  Service: Cardiovascular;  Laterality: Right;   Social History   Socioeconomic History   Marital status: Single    Spouse name: Not on file   Number of children: Not on file   Years of education: Not on file   Highest education level: Not on file  Occupational History    Comment: disabled  Tobacco Use   Smoking status: Former     Types: Cigarettes    Quit date: 2018    Years since quitting: 5.1   Smokeless tobacco: Never  Vaping Use   Vaping Use: Never used  Substance and Sexual Activity   Alcohol use: Not Currently   Drug use: Not Currently   Sexual activity: Not Currently  Other Topics Concern   Not on file  Social History Narrative   Patient lives alone.  Feels safe in her home.   Social Determinants of Health   Financial Resource Strain: Not on file  Food Insecurity: Not on file  Transportation Needs: Not on file  Physical Activity: Not on file  Stress: Not on file  Social Connections: Not on file   Family History  Problem Relation Age of Onset   CAD Mother    CAD Brother    Breast cancer Sister 28   Allergies  Allergen Reactions   Hydralazine Hives, Shortness Of Breath, Swelling and Rash    Body aches    Kiwi Extract Hives, Swelling and Rash    Mouth swells    Lisinopril Swelling    Angioedema   Shellfish Allergy Anaphylaxis    Shrimp/lobster  Betadine leaves welts on skin     Betadine [Povidone Iodine] Hives and Rash    LEAVES WELTS ON SKIN   Metformin Diarrhea and Other (See Comments)    GI Intolerance   Tape Itching    Use paper tape whenever possible   Prior to Admission medications   Medication Sig Start Date End Date Taking? Authorizing Provider  acetaminophen (TYLENOL) 500 MG tablet Take 1,000 mg by mouth every 6 (six) hours as needed for mild pain or moderate pain.    [provider]  albuterol (VENTOLIN HFA) 108 (90 Base) MCG/ACT inhaler Inhale 2 puffs into the lungs every 6 (six) hours as needed for wheezing or shortness of breath.    [provider]  amLODipine (NORVASC) 10 MG tablet Take 1 tablet (10 mg total) by mouth daily. 03/03/20   Lorella Nimrod, MD  ammonium lactate (LAC-HYDRIN) 12 % lotion Apply 1 application topically every Monday, Wednesday, and Friday.    [provider]  aspirin 81 MG EC tablet Take 1 tablet (81 mg total) by  mouth daily. 02/10/21   Max Sane, MD  atorvastatin (LIPITOR) 80 MG tablet Take 1 tablet (80 mg total) by mouth daily at 6 PM. Patient taking differently: Take 80 mg by mouth daily. 11/26/19   Lavina Hamman, MD  Blood Glucose Monitoring Suppl (TRUE METRIX METER) w/Device KIT  09/02/21   [provider]  carvedilol (COREG) 25 MG tablet Take 25 mg by mouth 2 (two) times daily.    [provider]  furosemide (LASIX) 80 MG tablet Take 80 mg by mouth 4 (four) times a week. Tues, Thurs, Sat, and Sun (non-dialysis days)    [provider]  gabapentin (NEURONTIN) 100 MG capsule Take 100 mg by mouth daily as needed (pain). 10/14/21   [provider]  insulin aspart protamine- aspart (NOVOLOG MIX 70/30) (70-30) 100 UNIT/ML injection Inject 0.15 mLs (15 Units total) into the skin 2 (two) times daily with a meal. Patient taking differently: Inject 15 Units into the skin 3 (three) times daily with meals. 11/12/20   Fritzi Mandes, MD  isosorbide mononitrate (IMDUR) 30 MG 24 hr tablet Take 30 mg by mouth daily.    [provider]  lidocaine-prilocaine (EMLA) cream Apply 1 application topically as needed (fistula access).    [provider]  methocarbamol (ROBAXIN) 500 MG tablet Take 1 tablet (500 mg total) by mouth every 8 (eight) hours as needed for muscle spasms. 02/02/22   Triplett, Cari B, FNP  mirtazapine (REMERON) 30 MG tablet Take 30 mg by mouth at bedtime.    [provider]  multivitamin (RENA-VIT) TABS tablet Take 1 tablet by mouth every Monday, Wednesday, and Friday.    [provider]  omeprazole (PRILOSEC) 40 MG capsule Take 40 mg by mouth daily. 09/22/20   [provider]  ondansetron (ZOFRAN ODT) 4 MG disintegrating tablet Take 1 tablet (4 mg total) by mouth every 8 (eight) hours as needed. 11/04/21   Rudene Re, MD  oxyCODONE (OXY IR/ROXICODONE) 5 MG immediate release tablet Take 1-2 tablets (5-10 mg total) by  mouth every 6 (six) hours as needed for severe pain. 02/04/22   Schnier, [name] Lory, MD  polyethylene glycol (MIRALAX / GLYCOLAX) 17 g packet Take 17 g by mouth daily. Patient taking differently: Take 17 g by mouth daily as needed for moderate constipation. 02/11/21   Lorella Nimrod, MD  sucralfate (CARAFATE) 1 g tablet Take 1 g by mouth 2 (two) times daily before a meal.    [provider]  topiramate (TOPAMAX) 25 MG tablet Take 25 mg by mouth daily. 08/19/20   [provider]  traZODone (DESYREL) 50 MG tablet Take 50 mg by mouth at bedtime.    [provider]  TRUE METRIX BLOOD GLUCOSE TEST test strip  09/04/21   [provider]  vitamin B-12 (CYANOCOBALAMIN) 1000 MCG tablet Take 1,000 mcg by mouth daily.    [provider]   CT FEMUR LEFT WO CONTRAST  Result Date: 02/09/2022 CLINICAL DATA:  Left leg pain and hip pain, initial encounter EXAM: CT OF THE LOWER LEFT EXTREMITY WITHOUT CONTRAST TECHNIQUE: Multidetector CT imaging of the lower left extremity was performed according to the standard protocol. RADIATION DOSE REDUCTION: This exam was performed according to the departmental dose-optimization program which includes automated exposure control, adjustment of the mA and/or kV according to patient size and/or use of iterative reconstruction technique. COMPARISON:  Plain film from earlier the same day FINDINGS: Bones/Joint/Cartilage Degenerative changes of the left sacroiliac joint are noted. No acute fracture or dislocation is noted pelvic bony structures appear within normal limits. Ligaments Suboptimally assessed by CT. Muscles and Tendons Surrounding musculature appears within normal limits. Soft tissues Lower extremity edema is noted throughout the thigh particularly in the mid and distal thigh. No focal hematoma is seen. Atherosclerotic calcifications are noted. IMPRESSION: No acute bony abnormality is noted. Lower extremity edema is noted. Electronically  Signed   By: Inez Catalina M.D.   On: 02/09/2022 21:19   MR FEMUR LEFT WO CONTRAST  Result Date: 02/10/2022 CLINICAL DATA:  Left lower extremity swelling and pain. EXAM: MR OF THE LEFT FEMUR WITHOUT CONTRAST TECHNIQUE: Multiplanar,  multisequence MR imaging of the left thigh was performed. No intravenous contrast was administered. COMPARISON:  Left femoral series earlier today, left hip series 02/02/2022 FINDINGS: Bones/Joint/Cartilage There is no evidence of fractures. Partial joint space loss is again noted at the hips symmetrically. There is mild symmetric nonerosive degenerative arthrosis at the knees, with symmetric small suprapatellar bursal effusions. There are small amounts of fluid symmetrically in the hip joint spaces possibly physiologic or could be due to minimal symmetric joint effusions. Ligaments Not well evaluated given nondedicated technique. Grossly intact as far seen. There is 1 cm of lateral patellar drift on the left which could indicate medial patellofemoral ligament laxity, but the ligament itself is intact. Muscles and Tendons, soft tissues There is diffuse edema primarily in the anterior compartment musculature, most notably in the gracilis muscle, vastus medialis, adductor longus, the sartorius muscle, and the medial aspect of the vastus intermedius. There is additional edema in the overlying subcutaneous plane anteriorly and medially, and scattered fluid collecting along the fat muscle interfaces in the thigh laterally, medially and anteriorly. There is fluid extending along the intermuscular septa as well, primarily in the anterior compartment. There is a small intramuscular collection in the vastus medialis measuring 2.9 x 1.9 cm with peripheral low signal intensity and small areas of signal dropout within it, which could be a hematoma or abscess. No other focal fluid collections are observed. Superficial edema continues into the foreleg without further intramuscular edema. Visualized  portions of the left hemipelvis are unremarkable. IMPRESSION: 1. No evidence of fractures. 2. Diffuse edema primarily involving the anterior compartment musculature of the left thigh, with overlying edema in the subcutaneous plane and nonlocalizing fluid along fat muscle interfaces and intermuscular septa. 3. There is a 2.9 x 1.9 cm focal collection within the vastus medialis which could be an intramuscular hematoma or abscess. 4. The primary differential considerations would be an infectious process with multifocal myositis/pyomyositis with overlying cellulitis, or edema due to a traumatic etiology. Certainly a necrotizing fasciitis/compartment syndrome is not excluded. 5. Mild degenerative changes of the knee joints and hip joints, with small amounts of symmetric bilateral uncomplicated hip joint and suprapatellar bursal fluid. Electronically Signed   By: Telford Nab M.D.   On: 02/10/2022 00:56   US Venous Img Lower Unilateral Left  Result Date: 02/09/2022 CLINICAL DATA:  Left leg pain EXAM: Left LOWER EXTREMITY VENOUS DOPPLER ULTRASOUND TECHNIQUE: Gray-scale sonography with compression, as well as color and duplex ultrasound, were performed to evaluate the deep venous system(s) from the level of the common femoral vein through the popliteal and proximal calf veins. COMPARISON:  02/02/2022 FINDINGS: VENOUS Patient would not allow compression views within the upper leg due to pain. Normal compressibility of the popliteal veins, as well as the visualized calf veins. Visualized portions of profunda femoral vein. Suspicion of nonocclusive thrombus within the greater saphenous vein in the upper thigh near the saphenofemoral junction. Doppler waveforms show normal direction of venous flow, normal respiratory plasticity and response to augmentation. Limited views of the contralateral common femoral vein are unremarkable. OTHER Small fluid collection in the popliteal fossa measuring 2 cm. Limitations: Unable to  perform compression views from the hip to the knee due to pain IMPRESSION: 1. The examination was limited by inability to perform compression views from the hip to the knee secondary to severe pain. Deep veins appear grossly patent on color flow images with normal waveforms. 2. Short segment of nonocclusive thrombus/thrombophlebitis within the greater saphenous vein in the upper  thigh near the saphenofemoral junction. 3. 2 cm fluid collection in the popliteal fossa Electronically Signed   By: Donavan Foil M.D.   On: 02/09/2022 23:28   CT Renal Stone Study  Result Date: 02/09/2022 CLINICAL DATA:  Flank pain EXAM: CT ABDOMEN AND PELVIS WITHOUT CONTRAST TECHNIQUE: Multidetector CT imaging of the abdomen and pelvis was performed following the standard protocol without IV contrast. RADIATION DOSE REDUCTION: This exam was performed according to the departmental dose-optimization program which includes automated exposure control, adjustment of the mA and/or kV according to patient size and/or use of iterative reconstruction technique. COMPARISON:  CT 10/28/2021 FINDINGS: Lower chest: Lung bases show no acute consolidation or effusion. Cardiomegaly. Hepatobiliary: Distended gallbladder without calcified stone. No focal hepatic abnormality or biliary dilatation Pancreas: Unremarkable. No pancreatic ductal dilatation or surrounding inflammatory changes. Spleen: Normal in size without focal abnormality. Adrenals/Urinary Tract: Adrenal glands are normal. Probable cortical scarring within the bilateral kidneys. No hydronephrosis. Low-density renal lesions incompletely characterized without contrast. The bladder is unremarkable Stomach/Bowel: The stomach is nonenlarged. No dilated small bowel. No acute bowel wall thickening Vascular/Lymphatic: Advanced aortic atherosclerosis. No aneurysm. Mild retroperitoneal lymph nodes measuring up to 11 mm. Reproductive: Uterus unremarkable.  No adnexal mass. Other: Negative for pelvic  effusion or free air. Musculoskeletal: No acute osseous abnormality. IMPRESSION: 1. Negative for hydronephrosis or ureteral stone. Probable cortical scarring within the bilateral kidneys. 2. No CT evidence for acute intra-abdominopelvic abnormality. Electronically Signed   By: Donavan Foil M.D.   On: 02/09/2022 21:17   DG Femur Min 2 Views Left  Result Date: 02/09/2022 CLINICAL DATA:  Left-sided leg pain EXAM: LEFT FEMUR 2 VIEWS COMPARISON:  None. FINDINGS: There is no evidence of fracture or other focal bone lesions. Soft tissues are unremarkable. Vascular calcifications. Clips at the medial lower leg. IMPRESSION: No acute osseous abnormality Electronically Signed   By: Donavan Foil M.D.   On: 02/09/2022 18:16    Positive ROS: All other systems have been reviewed and were otherwise negative with the exception of those mentioned in the HPI and as above.  Physical Exam: General: Alert, no acute distress Cardiovascular: No pedal edema Respiratory: No cyanosis, no use of accessory musculature GI: No organomegaly, abdomen is soft and non-tender Skin: No lesions in the area of chief complaint Neurologic: Sensation intact distally Psychiatric: Patient is competent for consent with normal mood and affect Lymphatic: No axillary or cervical lymphadenopathy  MUSCULOSKELETAL: moderate swelling, warmth proximal medial thigh. Compartments soft. Good cap refill. Motor and sensory intact distally.  Assessment: Left thigh swelling  Plan: Would recommend interventional radiology for aspiration and drainage of possible fluid collection. Direct antibiotic therapy on cultures. If concerned about necrotizing fasciitis would recommend general surgery consult. Please call with questions.    Lovell Sheehan, MD    02/10/2022 1:00 PM

## 2022-02-10 NOTE — ED Notes (Signed)
Pt called on call bell requesting for food. After consulted with nurse Percell Locus, to verify there was no diety restriction. She requested that writer do a CBG. CBG was 77, pt received a cold tray with chips and gram crackers inside per Rn's request

## 2022-02-11 ENCOUNTER — Encounter: Payer: Self-pay | Admitting: Nephrology

## 2022-02-11 LAB — CBC WITH DIFFERENTIAL/PLATELET
Abs Immature Granulocytes: 0.04 10*3/uL (ref 0.00–0.07)
Basophils Absolute: 0 10*3/uL (ref 0.0–0.1)
Basophils Relative: 1 %
Eosinophils Absolute: 0.2 10*3/uL (ref 0.0–0.5)
Eosinophils Relative: 2 %
HCT: 35.1 % — ABNORMAL LOW (ref 36.0–46.0)
Hemoglobin: 10.9 g/dL — ABNORMAL LOW (ref 12.0–15.0)
Immature Granulocytes: 1 %
Lymphocytes Relative: 9 %
Lymphs Abs: 0.8 10*3/uL (ref 0.7–4.0)
MCH: 27.9 pg (ref 26.0–34.0)
MCHC: 31.1 g/dL (ref 30.0–36.0)
MCV: 89.8 fL (ref 80.0–100.0)
Monocytes Absolute: 0.9 10*3/uL (ref 0.1–1.0)
Monocytes Relative: 11 %
Neutro Abs: 6.9 10*3/uL (ref 1.7–7.7)
Neutrophils Relative %: 76 %
Platelets: 240 10*3/uL (ref 150–400)
RBC: 3.91 MIL/uL (ref 3.87–5.11)
RDW: 14.8 % (ref 11.5–15.5)
WBC: 8.9 10*3/uL (ref 4.0–10.5)
nRBC: 0 % (ref 0.0–0.2)

## 2022-02-11 LAB — GLUCOSE, CAPILLARY
Glucose-Capillary: 121 mg/dL — ABNORMAL HIGH (ref 70–99)
Glucose-Capillary: 107 mg/dL — ABNORMAL HIGH (ref 70–99)
Glucose-Capillary: 118 mg/dL — ABNORMAL HIGH (ref 70–99)
Glucose-Capillary: 153 mg/dL — ABNORMAL HIGH (ref 70–99)

## 2022-02-11 LAB — BASIC METABOLIC PANEL
Anion gap: 16 — ABNORMAL HIGH (ref 5–15)
BUN: 27 mg/dL — ABNORMAL HIGH (ref 6–20)
CO2: 26 mmol/L (ref 22–32)
Calcium: 8.1 mg/dL — ABNORMAL LOW (ref 8.9–10.3)
Chloride: 93 mmol/L — ABNORMAL LOW (ref 98–111)
Creatinine, Ser: 5.47 mg/dL — ABNORMAL HIGH (ref 0.44–1.00)
GFR, Estimated: 8 mL/min — ABNORMAL LOW (ref >60)
Glucose, Bld: 132 mg/dL — ABNORMAL HIGH (ref 70–99)
Potassium: 4.1 mmol/L (ref 3.5–5.1)
Sodium: 135 mmol/L (ref 135–145)

## 2022-02-11 LAB — HEPATITIS B SURFACE ANTIBODY, QUANTITATIVE: Hep B S AB Quant (Post): 38 m[IU]/mL (ref >9.9)

## 2022-02-11 MED ORDER — SODIUM CHLORIDE 0.9 % IV SOLN
3.0000 g | INTRAVENOUS | Status: DC
  Administered 2022-02-11 – 2022-02-15 (×5): 3 g via INTRAVENOUS
  Filled 2022-02-11 (×3): qty 8
  Filled 2022-02-11 (×2): qty 3
  Filled 2022-02-11: qty 8

## 2022-02-11 MED ORDER — VANCOMYCIN HCL 750 MG/150ML IV SOLN: 750.0000 mg | INTRAVENOUS | Status: DC

## 2022-02-11 NOTE — Care Management Important Message (Signed)
Important Message  Patient Details  Name: EMBER HENRIKSON MRN: [id number] Date of Birth: [birth year]   Medicare Important Message Given:  N/A - LOS <3 / Initial given by admissions     Dannette Barbara 02/11/2022, 5:00 PM

## 2022-02-11 NOTE — Progress Notes (Signed)
PROGRESS NOTE  DEL WISEMAN  YQM:[id number] DOB: [birth year] DOA: 02/09/2022 PCP: Kirk Ruths, MD   Brief Narrative: Patient is a 61 year old female with history of ESRD on dialysis on Monday, Wednesday, Friday, systolic congestive heart failure, hypertension, diabetes type 2, depression, COPD who presented with pain on the left thigh.  On presentation she was hypertensive.  CT left femur did not show any acute abnormality.  Also noted to have lower extremity edema.  Presentation was consistent with cellulitis of the left thigh and was started on antibiotics.  MRI showed diffuse edema primarily involving the anterior compartment musculature of the left thigh,  2.9 x 1.9 cm focal collection within the vastus medialis which could be an intramuscular hematoma or abscess.  Orthopedics consulted ,IR consulted for possible drainage.  Ultrasound of the thigh did not show any focal fluid collection so procedure not done.  Currently on IV antibiotics.  Assessment & Plan:  Principal Problem:   Acute pain of left thigh Active Problems:   Type 2 diabetes mellitus with hyperlipidemia (HCC)   Essential hypertension   HFrEF (heart failure with reduced ejection fraction) (HCC)   CAD (coronary artery disease)   ESRD on dialysis ([place])   Cellulitis of left thigh   Hx of CABG   Assessment and Plan: * Acute pain of left thigh- (present on admission) Seen by vascular with low suspicion for vascular etiology.  Negative for DVT on 2/14 MRI showed MRI showed diffuse edema primarily involving the anterior compartment musculature of the left thigh,  2.9 x 1.9 cm focal collection within the vastus medialis which could be an intramuscular hematoma or abscess.  Orthopedics /IR consulted, ultrasound did not show any abscess or collection so procedure canceled. Continue broad spectrum abx.F/U cultures  Cellulitis of left thigh CT showed no acute bony injury.MRI showed possible abscess versus hematoma. [place] did  not show any fluid collection  ESRD on dialysis [facility]) Nephrology consult for continuation of dialysis  CAD (coronary artery disease)- (present on admission) Patient has history of CABG Continue aspirin, atorvastatin, carvedilol and Imdur  HFrEF (heart failure with reduced ejection fraction) ([place])- (present on admission) Currently euvolemic Continue furosemide, carvedilol, Imdur  Essential hypertension- (present on admission) Continue amlodipine and carvedilol  Type 2 diabetes mellitus with hyperlipidemia ([place])- (present on admission) Sliding scale insulin coverage for now              DVT prophylaxis:heparin injection 5,000 Units Start: 02/09/22 2300     Code Status: Full Code  Family Communication: None at the bedside  Patient status: Inpatient  Patient is from : Home  Anticipated discharge to: Home  Estimated DC date: 1-2 days, needs to wait for further improvement in the pain and edema of the left thigh   Consultants: Orthopedics,IR  Procedures: None   Antimicrobials:  Anti-infectives (From admission, onward)    Start     Dose/Rate Route Frequency Ordered Stop   02/12/22 1200  vancomycin (VANCOREADY) IVPB 750 mg/150 mL        750 mg 150 mL/hr over 60 Minutes Intravenous Every M-W-F (Hemodialysis) 02/11/22 0711     02/10/22 2200  cefTRIAXone (ROCEPHIN) 1 g in sodium chloride 0.9 % 100 mL IVPB  Status:  Discontinued        1 g 200 mL/hr over 30 Minutes Intravenous Every 24 hours 02/09/22 2246 02/10/22 1158   02/10/22 2100  vancomycin (VANCOREADY) IVPB 2000 mg/400 mL        2,000 mg 200 mL/hr over  120 Minutes Intravenous  Once 02/10/22 1650 02/10/22 2253   02/10/22 1400  ceFEPIme (MAXIPIME) 1 g in sodium chloride 0.9 % 100 mL IVPB        1 g 200 mL/hr over 30 Minutes Intravenous Every 24 hours 02/10/22 1237     02/10/22 1245  vancomycin (VANCOREADY) IVPB 2000 mg/400 mL  Status:  Discontinued        2,000 mg 200 mL/hr over 120 Minutes Intravenous  Once  02/10/22 1237 02/10/22 1650   02/09/22 2200  cefTRIAXone (ROCEPHIN) 2 g in sodium chloride 0.9 % 100 mL IVPB        2 g 200 mL/hr over 30 Minutes Intravenous  Once 02/09/22 2155 02/09/22 2248       Subjective:  Patient seen and examined at the bedside this morning.  She looks better today.  More comfortable.  Pain on the left thigh is better.  Examination of left thigh showed improved edema, tenderness   objective: Vitals:   02/10/22 2252 02/11/22 0334 02/11/22 0506 02/11/22 0515  BP: (!) 105/58  (!) 117/54   Pulse: 65     Resp:   16   Temp:   98.2 F (36.8 C)   TempSrc:   Oral   SpO2:  93% (!) 88% 93%  Weight:      Height:        Intake/Output Summary (Last 24 hours) at 02/11/2022 0746 Last data filed at 02/11/2022 0500 Gross per 24 hour  Intake 450 ml  Output 992 ml  Net -542 ml   Filed Weights   02/10/22 1549 02/10/22 1824  Weight: 80.6 kg 79.9 kg    Examination:  General exam: Overall comfortable, not in distress, obese HEENT: PERRL Respiratory system:  no wheezes or crackles  Cardiovascular system: S1 & S2 heard, RRR.  Gastrointestinal system: Abdomen is nondistended, soft and nontender. Central nervous system: Alert and oriented Extremities: Mild edema of the left thigh, AV fistula on the right arm Skin: No rashes, no ulcers,no icterus     Data Reviewed: I have personally reviewed following labs and imaging studies  CBC: Recent Labs  Lab 02/09/22 1716 02/11/22 0403  WBC 8.9 8.9  NEUTROABS  --  6.9  HGB 12.5 10.9*  HCT 40.6 35.1*  MCV 91.2 89.8  PLT 279 130   Basic Metabolic Panel: Recent Labs  Lab 02/09/22 1716 02/11/22 0403  NA 136 135  K 4.5 4.1  CL 94* 93*  CO2 27 26  GLUCOSE 104* 132*  BUN 33* 27*  CREATININE 6.18* 5.47*  CALCIUM 8.8* 8.1*     Recent Results (from the past 240 hour(s))  MRSA Next Gen by PCR, Nasal     Status: None   Collection Time: 02/10/22  3:00 PM   Specimen: Nasal Mucosa; Nasal Swab  Result Value Ref  Range Status   MRSA by PCR Next Gen NOT DETECTED NOT DETECTED Final    Comment: (NOTE) The GeneXpert MRSA Assay (FDA approved for NASAL specimens only), is one component of a comprehensive MRSA colonization surveillance program. It is not intended to diagnose MRSA infection nor to guide or monitor treatment for MRSA infections. Test performance is not FDA approved in patients less than 49 years old. Performed at [facility], [address] [place], [place] [address]   Resp Panel by RT-PCR (Flu A&B, Covid) Nasal Mucosa     Status: None   Collection Time: 02/10/22  3:00 PM   Specimen: Nasal Mucosa; Nasopharyngeal(NP) swabs in  vial transport medium  Result Value Ref Range Status   SARS Coronavirus 2 by RT PCR NEGATIVE NEGATIVE Final    Comment: (NOTE) SARS-CoV-2 target nucleic acids are NOT DETECTED.  The SARS-CoV-2 RNA is generally detectable in upper respiratory specimens during the acute phase of infection. The lowest concentration of SARS-CoV-2 viral copies this assay can detect is 138 copies/mL. A negative result does not preclude SARS-Cov-2 infection and should not be used as the sole basis for treatment or other patient management decisions. A negative result may occur with  improper specimen collection/handling, submission of specimen other than nasopharyngeal swab, presence of viral mutation(s) within the areas targeted by this assay, and inadequate number of viral copies(<138 copies/mL). A negative result must be combined with clinical observations, patient history, and epidemiological information. The expected result is Negative.  Fact Sheet for Patients:  EntrepreneurPulse.com.au  Fact Sheet for Healthcare Providers:  IncredibleEmployment.be  This test is no t yet approved or cleared by the [place] FDA and  has been authorized for detection and/or diagnosis of SARS-CoV-2 by FDA under an Emergency Use Authorization  (EUA). This EUA will remain  in effect (meaning this test can be used) for the duration of the COVID-19 declaration under Section 564(b)(1) of the Act, 21 U.S.C.section 360bbb-3(b)(1), unless the authorization is terminated  or revoked sooner.       Influenza A by PCR NEGATIVE NEGATIVE Final   Influenza B by PCR NEGATIVE NEGATIVE Final    Comment: (NOTE) The Xpert Xpress SARS-CoV-2/FLU/RSV plus assay is intended as an aid in the diagnosis of influenza from Nasopharyngeal swab specimens and should not be used as a sole basis for treatment. Nasal washings and aspirates are unacceptable for Xpert Xpress SARS-CoV-2/FLU/RSV testing.  Fact Sheet for Patients: EntrepreneurPulse.com.au  Fact Sheet for Healthcare Providers: IncredibleEmployment.be  This test is not yet approved or cleared by the [place] FDA and has been authorized for detection and/or diagnosis of SARS-CoV-2 by FDA under an Emergency Use Authorization (EUA). This EUA will remain in effect (meaning this test can be used) for the duration of the COVID-19 declaration under Section 564(b)(1) of the Act, 21 U.S.C. section 360bbb-3(b)(1), unless the authorization is terminated or revoked.  Performed at [facility], [address] [place], [place] [address]   Culture, blood (routine x 2)     Status: None (Preliminary result)   Collection Time: 02/10/22  8:40 PM   Specimen: BLOOD  Result Value Ref Range Status   Specimen Description BLOOD LEFT HAND  Final   Special Requests   Final    BOTTLES DRAWN AEROBIC AND ANAEROBIC Blood Culture adequate volume   Culture   Final    NO GROWTH < 12 HOURS Performed at [facility], [address] [place], [place] [address]    Report Status PENDING  Incomplete  Culture, blood (routine x 2)     Status: None (Preliminary result)   Collection Time: 02/10/22 10:05 PM   Specimen: BLOOD  Result Value Ref Range Status    Specimen Description BLOOD LEFT WRIST  Final   Special Requests   Final    BOTTLES DRAWN AEROBIC AND ANAEROBIC Blood Culture adequate volume   Culture   Final    NO GROWTH < 12 HOURS Performed at [facility], [address] [place], [place] [address]    Report Status PENDING  Incomplete     Radiology Studies: CT FEMUR LEFT WO CONTRAST  Result Date: 02/09/2022 CLINICAL DATA:  Left  leg pain and hip pain, initial encounter EXAM: CT OF THE LOWER LEFT EXTREMITY WITHOUT CONTRAST TECHNIQUE: Multidetector CT imaging of the lower left extremity was performed according to the standard protocol. RADIATION DOSE REDUCTION: This exam was performed according to the departmental dose-optimization program which includes automated exposure control, adjustment of the mA and/or kV according to patient size and/or use of iterative reconstruction technique. COMPARISON:  Plain film from earlier the same day FINDINGS: Bones/Joint/Cartilage Degenerative changes of the left sacroiliac joint are noted. No acute fracture or dislocation is noted pelvic bony structures appear within normal limits. Ligaments Suboptimally assessed by CT. Muscles and Tendons Surrounding musculature appears within normal limits. Soft tissues Lower extremity edema is noted throughout the thigh particularly in the mid and distal thigh. No focal hematoma is seen. Atherosclerotic calcifications are noted. IMPRESSION: No acute bony abnormality is noted. Lower extremity edema is noted. Electronically Signed   By: Inez Catalina M.D.   On: 02/09/2022 21:19   MR FEMUR LEFT WO CONTRAST  Result Date: 02/10/2022 CLINICAL DATA:  Left lower extremity swelling and pain. EXAM: MR OF THE LEFT FEMUR WITHOUT CONTRAST TECHNIQUE: Multiplanar, multisequence MR imaging of the left thigh was performed. No intravenous contrast was administered. COMPARISON:  Left femoral series earlier today, left hip series 02/02/2022 FINDINGS: Bones/Joint/Cartilage There is no  evidence of fractures. Partial joint space loss is again noted at the hips symmetrically. There is mild symmetric nonerosive degenerative arthrosis at the knees, with symmetric small suprapatellar bursal effusions. There are small amounts of fluid symmetrically in the hip joint spaces possibly physiologic or could be due to minimal symmetric joint effusions. Ligaments Not well evaluated given nondedicated technique. Grossly intact as far seen. There is 1 cm of lateral patellar drift on the left which could indicate medial patellofemoral ligament laxity, but the ligament itself is intact. Muscles and Tendons, soft tissues There is diffuse edema primarily in the anterior compartment musculature, most notably in the gracilis muscle, vastus medialis, adductor longus, the sartorius muscle, and the medial aspect of the vastus intermedius. There is additional edema in the overlying subcutaneous plane anteriorly and medially, and scattered fluid collecting along the fat muscle interfaces in the thigh laterally, medially and anteriorly. There is fluid extending along the intermuscular septa as well, primarily in the anterior compartment. There is a small intramuscular collection in the vastus medialis measuring 2.9 x 1.9 cm with peripheral low signal intensity and small areas of signal dropout within it, which could be a hematoma or abscess. No other focal fluid collections are observed. Superficial edema continues into the foreleg without further intramuscular edema. Visualized portions of the left hemipelvis are unremarkable. IMPRESSION: 1. No evidence of fractures. 2. Diffuse edema primarily involving the anterior compartment musculature of the left thigh, with overlying edema in the subcutaneous plane and nonlocalizing fluid along fat muscle interfaces and intermuscular septa. 3. There is a 2.9 x 1.9 cm focal collection within the vastus medialis which could be an intramuscular hematoma or abscess. 4. The primary  differential considerations would be an infectious process with multifocal myositis/pyomyositis with overlying cellulitis, or edema due to a traumatic etiology. Certainly a necrotizing fasciitis/compartment syndrome is not excluded. 5. Mild degenerative changes of the knee joints and hip joints, with small amounts of symmetric bilateral uncomplicated hip joint and suprapatellar bursal fluid. Electronically Signed   By: Telford Nab M.D.   On: 02/10/2022 00:56   US Venous Img Lower Unilateral Left  Result Date: 02/09/2022 CLINICAL DATA:  Left leg  pain EXAM: Left LOWER EXTREMITY VENOUS DOPPLER ULTRASOUND TECHNIQUE: Gray-scale sonography with compression, as well as color and duplex ultrasound, were performed to evaluate the deep venous system(s) from the level of the common femoral vein through the popliteal and proximal calf veins. COMPARISON:  02/02/2022 FINDINGS: VENOUS Patient would not allow compression views within the upper leg due to pain. Normal compressibility of the popliteal veins, as well as the visualized calf veins. Visualized portions of profunda femoral vein. Suspicion of nonocclusive thrombus within the greater saphenous vein in the upper thigh near the saphenofemoral junction. Doppler waveforms show normal direction of venous flow, normal respiratory plasticity and response to augmentation. Limited views of the contralateral common femoral vein are unremarkable. OTHER Small fluid collection in the popliteal fossa measuring 2 cm. Limitations: Unable to perform compression views from the hip to the knee due to pain IMPRESSION: 1. The examination was limited by inability to perform compression views from the hip to the knee secondary to severe pain. Deep veins appear grossly patent on color flow images with normal waveforms. 2. Short segment of nonocclusive thrombus/thrombophlebitis within the greater saphenous vein in the upper thigh near the saphenofemoral junction. 3. 2 cm fluid collection in  the popliteal fossa Electronically Signed   By: Donavan Foil M.D.   On: 02/09/2022 23:28   [place] LT LOWER EXTREM LTD SOFT TISSUE NON VASCULAR  Result Date: 02/10/2022 CLINICAL DATA:  61 year old with left leg pain. Concern for a fluid collection in the left thigh vastus medialis. Evaluate for percutaneous aspiration. EXAM: ULTRASOUND LEFT LOWER EXTREMITY LIMITED TECHNIQUE: Ultrasound examination of the lower extremity soft tissues was performed in the area of clinical concern. COMPARISON:  MRI 02/09/2022 FINDINGS: The area of concern is the medial left thigh. The area of concern is just medial and posterior to the left femoral vessels. No discrete fluid collection was identified in this area. It was very difficult to compress the soft tissues or the blood vessels in this area due to patient's discomfort. IMPRESSION: No discrete fluid collection identified at the area of concern in the medial left thigh. Aspiration not performed. Electronically Signed   By: Markus Daft M.D.   On: 02/10/2022 15:09   CT Renal Stone Study  Result Date: 02/09/2022 CLINICAL DATA:  Flank pain EXAM: CT ABDOMEN AND PELVIS WITHOUT CONTRAST TECHNIQUE: Multidetector CT imaging of the abdomen and pelvis was performed following the standard protocol without IV contrast. RADIATION DOSE REDUCTION: This exam was performed according to the departmental dose-optimization program which includes automated exposure control, adjustment of the mA and/or kV according to patient size and/or use of iterative reconstruction technique. COMPARISON:  CT 10/28/2021 FINDINGS: Lower chest: Lung bases show no acute consolidation or effusion. Cardiomegaly. Hepatobiliary: Distended gallbladder without calcified stone. No focal hepatic abnormality or biliary dilatation Pancreas: Unremarkable. No pancreatic ductal dilatation or surrounding inflammatory changes. Spleen: Normal in size without focal abnormality. Adrenals/Urinary Tract: Adrenal glands are normal.  Probable cortical scarring within the bilateral kidneys. No hydronephrosis. Low-density renal lesions incompletely characterized without contrast. The bladder is unremarkable Stomach/Bowel: The stomach is nonenlarged. No dilated small bowel. No acute bowel wall thickening Vascular/Lymphatic: Advanced aortic atherosclerosis. No aneurysm. Mild retroperitoneal lymph nodes measuring up to 11 mm. Reproductive: Uterus unremarkable.  No adnexal mass. Other: Negative for pelvic effusion or free air. Musculoskeletal: No acute osseous abnormality. IMPRESSION: 1. Negative for hydronephrosis or ureteral stone. Probable cortical scarring within the bilateral kidneys. 2. No CT evidence for acute intra-abdominopelvic abnormality. Electronically Signed   By:  Donavan Foil M.D.   On: 02/09/2022 21:17   DG Femur Min 2 Views Left  Result Date: 02/09/2022 CLINICAL DATA:  Left-sided leg pain EXAM: LEFT FEMUR 2 VIEWS COMPARISON:  None. FINDINGS: There is no evidence of fracture or other focal bone lesions. Soft tissues are unremarkable. Vascular calcifications. Clips at the medial lower leg. IMPRESSION: No acute osseous abnormality Electronically Signed   By: Donavan Foil M.D.   On: 02/09/2022 18:16    Scheduled Meds:  amLODipine  10 mg Oral Daily   aspirin EC  81 mg Oral Daily   atorvastatin  80 mg Oral q1800   carvedilol  25 mg Oral BID   Chlorhexidine Gluconate Cloth  6 each Topical Q0600   furosemide  80 mg Oral Once per day on Sun Tue Thu Sat   heparin  5,000 Units Subcutaneous Q8H   insulin aspart  0-15 Units Subcutaneous TID WC   isosorbide mononitrate  30 mg Oral Daily   LORazepam  0.5 mg Oral Once   mirtazapine  30 mg Oral QHS   naloxone       pantoprazole  40 mg Oral Daily   sucralfate  1 g Oral BID AC   topiramate  25 mg Oral Daily   Continuous Infusions:  ceFEPime (MAXIPIME) IV 1 g (02/11/22 0001)   [START ON 02/12/2022] vancomycin       LOS: 1 day   Shelly Coss, MD [facility] P2/23/2023, 7:46 AM

## 2022-02-11 NOTE — Progress Notes (Addendum)
Pt admitted to room 214 from dialysis right after PM shift change.  Pt was placed on 2 L O2 via [place] in the ED. Care RN received report from off going nurse. Pt turned from side to side for skin assessment and started c/o pain. PRN Norco given. patient was crying in pain constantly stating she could not bear the pain anymore. Care RN .proceeded to given patient IV dilaudid for sever pain to provide quicker relief. Care RN remained in the room as was completing some of the follow up admission questions. Once done asking question, patient became drowsy with POSS 3. Care RN notified provider, obtained VS, and notified charge RN. Provider ordered Narcan, pharmacy called to expedite verification. Charge RN pulled the Narcan and it was given with good response from patient. Patient return to baseline and no additional dose of Narcan required. Care RN has been following up with patient for pain and encouraging patient to utilize moderate pain control options when patient starts reaching number 5-7/10 instead of waiting to take pain medication when pain is unbearable to avoid using severe PRN option. Patient expresses understanding and has been demonstrating it.   Pt VSS remains stable. Care RN able to titrate O2 off to room air overnight for few hours but patient O2 was 88% at for morning VS. Placed on 1L O2 with brisk recovery.

## 2022-02-11 NOTE — TOC Initial Note (Signed)
Transition of Care [facility]) - Initial/Assessment Note    Patient Details  Name: Terry Arroyo MRN: [id number] Date of Birth: 1961-06-23  [facility]) CM/SW Contact:    Beverly Sessions, RN Phone Number: 02/11/2022, 2:05 PM  Clinical Narrative:                 Admitted NWG:NFAO leg cellulitis withocal collection within the vastus medialis muscle concerning for hematoma or abscess Admitted from: home alone PCP: [place] [facility] Current home health/prior home health/DME: Request a new RW  Patient states that she uses [facility] to get to an from HD and to appointments.  Elvera Bicker dialysis liaison notified of admission.    Referral made to [facility] with [employer] for RW  Patient does not feel that home health is indicated at discharge.   Patient states that she will need cab transport at discharge           Patient Goals and CMS Choice        Expected Discharge Plan and Services                                                Prior Living Arrangements/Services                       Activities of Daily Living Home Assistive Devices/Equipment: Dentures (specify type), Walker (specify type), Shower chair with back, Eyeglasses, Cane (specify quad or straight) (full top and partial lower) ADL Screening (condition at time of admission) Patient's cognitive ability adequate to safely complete daily activities?: Yes Is the patient deaf or have difficulty hearing?: No Does the patient have difficulty seeing, even when wearing glasses/contacts?: Yes (left eye blindness) Does the patient have difficulty concentrating, remembering, or making decisions?: No Patient able to express need for assistance with ADLs?: Yes Does the patient have difficulty dressing or bathing?: No Independently performs ADLs?: Yes (appropriate for developmental age) Does the patient have difficulty walking or climbing stairs?: Yes Weakness of Legs:  Left Weakness of Arms/Hands: None  Permission Sought/Granted                  Emotional Assessment              Admission diagnosis:  ESRD (end stage renal disease) ([place]) [N18.6] Left leg cellulitis [L03.116] Abscess of left thigh [L02.416] Acute pain of left thigh [M79.652] Patient Active Problem List   Diagnosis Date Noted   Acute pain of left thigh 02/09/2022   Cellulitis of left thigh 02/09/2022   Atherosclerosis of native arteries of extremity with intermittent claudication ([place]) 02/04/2022   Leg pain, anterior, left 02/04/2022   Angioedema 10/28/2021   Lactic acidosis    Diabetic polyneuropathy associated with type 2 diabetes mellitus ([place])    Sepsis ([place]) 09/05/2021   Acute CHF (congestive heart failure) ([place]) 09/05/2021   Fluid overload 06/11/2021   Hypokalemia 06/11/2021   Acute pulmonary edema (HCC)    COPD (chronic obstructive pulmonary disease) with emphysema ([place]) 02/09/2021   Chronic sphenoidal sinusitis 01/01/2021   Contact dermatitis and other eczema due to other specified agent 01/01/2021   Hydronephrosis, left 01/01/2021   Other diseases of nasal cavity and sinuses(478.19) 01/01/2021   ESRD on dialysis ([place])    Reactive thrombocytosis 11/08/2020   Nephrotic syndrome 11/04/2020   SOB (shortness  of breath) 11/01/2020   Acute on chronic combined systolic and diastolic CHF (congestive heart failure) ([place]) 10/21/2020   CAD (coronary artery disease) 10/21/2020   Hyperlipidemia associated with type 2 diabetes mellitus ([place]) 10/21/2020   HFrEF (heart failure with reduced ejection fraction) ([place])    Acute respiratory distress    COPD exacerbation ([place]) 10/10/2020   Kidney hematoma 09/13/2020   Essential hypertension    Acute renal failure superimposed on stage 4 chronic kidney disease (HCC)    Hematuria 09/12/2020   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[place]) 09/03/2020   Blind left eye 09/03/2020   Brain aneurysm 09/03/2020    Esotropia 09/03/2020   Lesion of ulnar nerve 09/03/2020   Mixed hyperlipidemia 09/03/2020   Sensory hearing loss, bilateral 09/03/2020   Tear film insufficiency 09/03/2020   Health maintenance examination 06/27/2020   Secondary hyperparathyroidism of renal origin ([place]) 04/24/2020   Anemia in chronic kidney disease 03/10/2020   Benign hypertensive kidney disease with chronic kidney disease 03/10/2020   Hyposmolality and/or hyponatremia 03/10/2020   Stage 3b chronic kidney disease ([place]) 03/10/2020   Calf tenderness    Acute on chronic heart failure ([place]) 02/27/2020   Leg edema    Type 2 diabetes mellitus with hyperlipidemia ([place]) 02/26/2020   CKD (chronic kidney disease) stage 4, GFR 15-29 ml/min (HCC) 02/26/2020   Hyponatremia 02/26/2020   Anasarca 02/26/2020   Proteinuria 02/26/2020   Hypoalbuminemia 02/26/2020   Elevated troponin 02/26/2020   Acute on chronic systolic CHF (congestive heart failure) ([place]) 02/26/2020   Aortic atherosclerosis ([place]) 02/18/2020   Hyperglycemia    Nausea    Hypertensive emergency    Myalgia due to statin 12/04/2019   Hypertensive urgency 11/24/2019   Chest pain 11/23/2019   OSA (obstructive sleep apnea) 04/10/2019   Mild episode of recurrent major depressive disorder ([place]) 04/10/2019   ACS (acute coronary syndrome) ([place]) 03/24/2019   COVID-19 virus infection 03/24/2019   Hypertensive heart/kidney disease w/chronic kidney disease stage III ([place]) 10/21/2017   Mild nonproliferative diabetic retinopathy of right eye associated with type 2 diabetes mellitus ([place]) 09/09/2017   Genital herpes 05/07/2017   DVT (deep venous thrombosis) ([place]) 12/30/2016   History of DVT (deep vein thrombosis) 12/30/2016   Hx of CABG 2018   PVD (posterior vitreous detachment), right eye 01/09/2016   Diabetic peripheral angiopathy ([place]) 01/03/2016   Age-related nuclear cataract of both eyes 10/24/2015   Chronic nonintractable headache 10/02/2015   Dry eyes, bilateral  02/13/2015   Proptosis 12/18/2014   Iritis of right eye 12/03/2014   Anemia 08/30/2014   Depression 08/30/2014   PCP:  Kirk Ruths, MD Pharmacy:   [facility] [address] [place] - [place] [address] [place] [address] Phone: [phone] Fax: [facility] [place], [place] [address] Phone: [phone] Fax: [place] [address]), [place] - [place]) [place] [address] Phone: [phone] Fax: [phone]     Social Determinants of Health (SDOH) Interventions    Readmission Risk Interventions Readmission Risk Prevention Plan 10/29/2021 11/03/2020 10/23/2020  Transportation Screening Complete Complete Complete  Medication Review Press photographer) Complete Complete Complete  PCP or Specialist appointment within 3-5 days of discharge Complete Not Complete Complete  PCP/Specialist Appt Not Complete comments - To be scheduled by unit secretary. [name] or [place] - Complete  Complete  SW Recovery Care/Counseling Consult Complete Complete Complete  Palliative Care Screening Not Applicable Not Applicable Not Applicable  Skilled Nursing Facility Not Applicable Patient Refused Not Applicable  SNF Comments - Patient refuses states she is able to take care of herself. -  Some recent data might be hidden

## 2022-02-11 NOTE — Progress Notes (Signed)
[facility]  ROUNDING NOTE   Subjective:   Terry Arroyo is a 61 year old female with past medical history including type 2 diabetes with polyneuropathy, depression, angioedema, heart failure, hypertension, and end-stage renal disease on dialysis.  Patient presents to the emergency department with extreme left thigh pain.  She will be admitted for ESRD (end stage renal disease) ([place]) [N18.6] Left leg cellulitis [L03.116] Abscess of left thigh [L02.416] Acute pain of left thigh [M79.652]  Patient is known to our practice and receives outpatient dialysis treatments at [facility] on a MWF schedule, supervised by Dr. Juleen China.  Last treatment received on Monday.   Patient seen resting comfortably States pain is more controlled today Tolerating meals without nausea or vomiting Received dialysis yesterday, tolerated well  Per nursing note patient received Narcan yesterday evening due to unresponsiveness after narcotic administration.   Objective:  Vital signs in last 24 hours:  Temp:  [98.2 F (36.8 C)-99 F (37.2 C)] 98.2 F (36.8 C) (02/23 0834) Pulse Rate:  [59-139] 64 (02/23 0834) Resp:  [10-21] 16 (02/23 0834) BP: (105-161)/(53-102) 120/53 (02/23 0834) SpO2:  [88 %-100 %] 100 % (02/23 0834) Weight:  [79.9 kg-80.6 kg] 79.9 kg (02/22 1824)  Weight change:  Filed Weights   02/10/22 1549 02/10/22 1824  Weight: 80.6 kg 79.9 kg    Intake/Output: I/O last 3 completed shifts: In: 546.9 [P.O.:350; IV Piggyback:196.9] Out: 992 [Other:992]   Intake/Output this shift:  Total I/O In: 120 [P.O.:120] Out: -   Physical Exam: General: NAD  Head: Normocephalic, atraumatic. Moist oral mucosal membranes  Eyes: Anicteric  Lungs:  Clear to auscultation, normal effort, room air  Heart: Regular rate and rhythm  Abdomen:  Soft, nontender, nondistended  Extremities: Left thigh edema  Neurologic: Alert, moving all four extremities  Skin: Left medial thigh erythema   Access: Right aVF    Basic Metabolic Panel: Recent Labs  Lab 02/09/22 1716 02/11/22 0403  NA 136 135  K 4.5 4.1  CL 94* 93*  CO2 27 26  GLUCOSE 104* 132*  BUN 33* 27*  CREATININE 6.18* 5.47*  CALCIUM 8.8* 8.1*     Liver Function Tests: Recent Labs  Lab 02/09/22 1716  AST 20  ALT 12  ALKPHOS 78  BILITOT 1.7*  PROT 8.1  ALBUMIN 3.7    Recent Labs  Lab 02/09/22 1716  LIPASE 25    No results for input(s): AMMONIA in the last 168 hours.  CBC: Recent Labs  Lab 02/09/22 1716 02/11/22 0403  WBC 8.9 8.9  NEUTROABS  --  6.9  HGB 12.5 10.9*  HCT 40.6 35.1*  MCV 91.2 89.8  PLT 279 240     Cardiac Enzymes: No results for input(s): CKTOTAL, CKMB, CKMBINDEX, TROPONINI in the last 168 hours.  BNP: Invalid input(s): POCBNP  CBG: Recent Labs  Lab 02/10/22 0428 02/10/22 1238 02/10/22 2200 02/11/22 0829 02/11/22 1212  GLUCAP 77 133* 108* 121* 107*     Microbiology: Results for orders placed or performed during the hospital encounter of 02/09/22  MRSA Next Gen by PCR, Nasal     Status: None   Collection Time: 02/10/22  3:00 PM   Specimen: Nasal Mucosa; Nasal Swab  Result Value Ref Range Status   MRSA by PCR Next Gen NOT DETECTED NOT DETECTED Final    Comment: (NOTE) The GeneXpert MRSA Assay (FDA approved for NASAL specimens only), is one component of a comprehensive MRSA colonization surveillance program. It is not intended to diagnose MRSA infection  nor to guide or monitor treatment for MRSA infections. Test performance is not FDA approved in patients less than 42 years old. Performed at [facility], [address] [place], [place] [address]   Resp Panel by RT-PCR (Flu A&B, Covid) Nasal Mucosa     Status: None   Collection Time: 02/10/22  3:00 PM   Specimen: Nasal Mucosa; Nasopharyngeal(NP) swabs in vial transport medium  Result Value Ref Range Status   SARS Coronavirus 2 by RT PCR NEGATIVE NEGATIVE Final    Comment:  (NOTE) SARS-CoV-2 target nucleic acids are NOT DETECTED.  The SARS-CoV-2 RNA is generally detectable in upper respiratory specimens during the acute phase of infection. The lowest concentration of SARS-CoV-2 viral copies this assay can detect is 138 copies/mL. A negative result does not preclude SARS-Cov-2 infection and should not be used as the sole basis for treatment or other patient management decisions. A negative result may occur with  improper specimen collection/handling, submission of specimen other than nasopharyngeal swab, presence of viral mutation(s) within the areas targeted by this assay, and inadequate number of viral copies(<138 copies/mL). A negative result must be combined with clinical observations, patient history, and epidemiological information. The expected result is Negative.  Fact Sheet for Patients:  EntrepreneurPulse.com.au  Fact Sheet for Healthcare Providers:  IncredibleEmployment.be  This test is no t yet approved or cleared by the [place] FDA and  has been authorized for detection and/or diagnosis of SARS-CoV-2 by FDA under an Emergency Use Authorization (EUA). This EUA will remain  in effect (meaning this test can be used) for the duration of the COVID-19 declaration under Section 564(b)(1) of the Act, 21 U.S.C.section 360bbb-3(b)(1), unless the authorization is terminated  or revoked sooner.       Influenza A by PCR NEGATIVE NEGATIVE Final   Influenza B by PCR NEGATIVE NEGATIVE Final    Comment: (NOTE) The Xpert Xpress SARS-CoV-2/FLU/RSV plus assay is intended as an aid in the diagnosis of influenza from Nasopharyngeal swab specimens and should not be used as a sole basis for treatment. Nasal washings and aspirates are unacceptable for Xpert Xpress SARS-CoV-2/FLU/RSV testing.  Fact Sheet for Patients: EntrepreneurPulse.com.au  Fact Sheet for Healthcare  Providers: IncredibleEmployment.be  This test is not yet approved or cleared by the [place] FDA and has been authorized for detection and/or diagnosis of SARS-CoV-2 by FDA under an Emergency Use Authorization (EUA). This EUA will remain in effect (meaning this test can be used) for the duration of the COVID-19 declaration under Section 564(b)(1) of the Act, 21 U.S.C. section 360bbb-3(b)(1), unless the authorization is terminated or revoked.  Performed at [facility], [address] [place], [place] [address]   Culture, blood (routine x 2)     Status: None (Preliminary result)   Collection Time: 02/10/22  8:40 PM   Specimen: BLOOD  Result Value Ref Range Status   Specimen Description BLOOD LEFT HAND  Final   Special Requests   Final    BOTTLES DRAWN AEROBIC AND ANAEROBIC Blood Culture adequate volume   Culture   Final    NO GROWTH < 12 HOURS Performed at [facility], [address] [place], [place] [address]    Report Status PENDING  Incomplete  Culture, blood (routine x 2)     Status: None (Preliminary result)   Collection Time: 02/10/22 10:05 PM   Specimen: BLOOD  Result Value Ref Range Status   Specimen Description BLOOD LEFT WRIST  Final   Special Requests  Final    BOTTLES DRAWN AEROBIC AND ANAEROBIC Blood Culture adequate volume   Culture   Final    NO GROWTH < 12 HOURS Performed at [facility], [address] [place], [place] [address]    Report Status PENDING  Incomplete    Coagulation Studies: No results for input(s): LABPROT, INR in the last 72 hours.  Urinalysis: No results for input(s): COLORURINE, LABSPEC, PHURINE, GLUCOSEU, HGBUR, BILIRUBINUR, KETONESUR, PROTEINUR, UROBILINOGEN, NITRITE, LEUKOCYTESUR in the last 72 hours.  Invalid input(s): APPERANCEUR    Imaging: CT FEMUR LEFT WO CONTRAST  Result Date: 02/09/2022 CLINICAL DATA:  Left leg pain and hip pain, initial encounter EXAM: CT OF THE  LOWER LEFT EXTREMITY WITHOUT CONTRAST TECHNIQUE: Multidetector CT imaging of the lower left extremity was performed according to the standard protocol. RADIATION DOSE REDUCTION: This exam was performed according to the departmental dose-optimization program which includes automated exposure control, adjustment of the mA and/or kV according to patient size and/or use of iterative reconstruction technique. COMPARISON:  Plain film from earlier the same day FINDINGS: Bones/Joint/Cartilage Degenerative changes of the left sacroiliac joint are noted. No acute fracture or dislocation is noted pelvic bony structures appear within normal limits. Ligaments Suboptimally assessed by CT. Muscles and Tendons Surrounding musculature appears within normal limits. Soft tissues Lower extremity edema is noted throughout the thigh particularly in the mid and distal thigh. No focal hematoma is seen. Atherosclerotic calcifications are noted. IMPRESSION: No acute bony abnormality is noted. Lower extremity edema is noted. Electronically Signed   By: Inez Catalina M.D.   On: 02/09/2022 21:19   MR FEMUR LEFT WO CONTRAST  Result Date: 02/10/2022 CLINICAL DATA:  Left lower extremity swelling and pain. EXAM: MR OF THE LEFT FEMUR WITHOUT CONTRAST TECHNIQUE: Multiplanar, multisequence MR imaging of the left thigh was performed. No intravenous contrast was administered. COMPARISON:  Left femoral series earlier today, left hip series 02/02/2022 FINDINGS: Bones/Joint/Cartilage There is no evidence of fractures. Partial joint space loss is again noted at the hips symmetrically. There is mild symmetric nonerosive degenerative arthrosis at the knees, with symmetric small suprapatellar bursal effusions. There are small amounts of fluid symmetrically in the hip joint spaces possibly physiologic or could be due to minimal symmetric joint effusions. Ligaments Not well evaluated given nondedicated technique. Grossly intact as far seen. There is 1 cm of  lateral patellar drift on the left which could indicate medial patellofemoral ligament laxity, but the ligament itself is intact. Muscles and Tendons, soft tissues There is diffuse edema primarily in the anterior compartment musculature, most notably in the gracilis muscle, vastus medialis, adductor longus, the sartorius muscle, and the medial aspect of the vastus intermedius. There is additional edema in the overlying subcutaneous plane anteriorly and medially, and scattered fluid collecting along the fat muscle interfaces in the thigh laterally, medially and anteriorly. There is fluid extending along the intermuscular septa as well, primarily in the anterior compartment. There is a small intramuscular collection in the vastus medialis measuring 2.9 x 1.9 cm with peripheral low signal intensity and small areas of signal dropout within it, which could be a hematoma or abscess. No other focal fluid collections are observed. Superficial edema continues into the foreleg without further intramuscular edema. Visualized portions of the left hemipelvis are unremarkable. IMPRESSION: 1. No evidence of fractures. 2. Diffuse edema primarily involving the anterior compartment musculature of the left thigh, with overlying edema in the subcutaneous plane and nonlocalizing fluid along fat muscle interfaces and intermuscular septa. 3. There  is a 2.9 x 1.9 cm focal collection within the vastus medialis which could be an intramuscular hematoma or abscess. 4. The primary differential considerations would be an infectious process with multifocal myositis/pyomyositis with overlying cellulitis, or edema due to a traumatic etiology. Certainly a necrotizing fasciitis/compartment syndrome is not excluded. 5. Mild degenerative changes of the knee joints and hip joints, with small amounts of symmetric bilateral uncomplicated hip joint and suprapatellar bursal fluid. Electronically Signed   By: Telford Nab M.D.   On: 02/10/2022 00:56   US  Venous Img Lower Unilateral Left  Result Date: 02/09/2022 CLINICAL DATA:  Left leg pain EXAM: Left LOWER EXTREMITY VENOUS DOPPLER ULTRASOUND TECHNIQUE: Gray-scale sonography with compression, as well as color and duplex ultrasound, were performed to evaluate the deep venous system(s) from the level of the common femoral vein through the popliteal and proximal calf veins. COMPARISON:  02/02/2022 FINDINGS: VENOUS Patient would not allow compression views within the upper leg due to pain. Normal compressibility of the popliteal veins, as well as the visualized calf veins. Visualized portions of profunda femoral vein. Suspicion of nonocclusive thrombus within the greater saphenous vein in the upper thigh near the saphenofemoral junction. Doppler waveforms show normal direction of venous flow, normal respiratory plasticity and response to augmentation. Limited views of the contralateral common femoral vein are unremarkable. OTHER Small fluid collection in the popliteal fossa measuring 2 cm. Limitations: Unable to perform compression views from the hip to the knee due to pain IMPRESSION: 1. The examination was limited by inability to perform compression views from the hip to the knee secondary to severe pain. Deep veins appear grossly patent on color flow images with normal waveforms. 2. Short segment of nonocclusive thrombus/thrombophlebitis within the greater saphenous vein in the upper thigh near the saphenofemoral junction. 3. 2 cm fluid collection in the popliteal fossa Electronically Signed   By: Donavan Foil M.D.   On: 02/09/2022 23:28   [place] LT LOWER EXTREM LTD SOFT TISSUE NON VASCULAR  Result Date: 02/10/2022 CLINICAL DATA:  61 year old with left leg pain. Concern for a fluid collection in the left thigh vastus medialis. Evaluate for percutaneous aspiration. EXAM: ULTRASOUND LEFT LOWER EXTREMITY LIMITED TECHNIQUE: Ultrasound examination of the lower extremity soft tissues was performed in the area of  clinical concern. COMPARISON:  MRI 02/09/2022 FINDINGS: The area of concern is the medial left thigh. The area of concern is just medial and posterior to the left femoral vessels. No discrete fluid collection was identified in this area. It was very difficult to compress the soft tissues or the blood vessels in this area due to patient's discomfort. IMPRESSION: No discrete fluid collection identified at the area of concern in the medial left thigh. Aspiration not performed. Electronically Signed   By: Markus Daft M.D.   On: 02/10/2022 15:09   CT Renal Stone Study  Result Date: 02/09/2022 CLINICAL DATA:  Flank pain EXAM: CT ABDOMEN AND PELVIS WITHOUT CONTRAST TECHNIQUE: Multidetector CT imaging of the abdomen and pelvis was performed following the standard protocol without IV contrast. RADIATION DOSE REDUCTION: This exam was performed according to the departmental dose-optimization program which includes automated exposure control, adjustment of the mA and/or kV according to patient size and/or use of iterative reconstruction technique. COMPARISON:  CT 10/28/2021 FINDINGS: Lower chest: Lung bases show no acute consolidation or effusion. Cardiomegaly. Hepatobiliary: Distended gallbladder without calcified stone. No focal hepatic abnormality or biliary dilatation Pancreas: Unremarkable. No pancreatic ductal dilatation or surrounding inflammatory changes. Spleen: Normal in size  without focal abnormality. Adrenals/Urinary Tract: Adrenal glands are normal. Probable cortical scarring within the bilateral kidneys. No hydronephrosis. Low-density renal lesions incompletely characterized without contrast. The bladder is unremarkable Stomach/Bowel: The stomach is nonenlarged. No dilated small bowel. No acute bowel wall thickening Vascular/Lymphatic: Advanced aortic atherosclerosis. No aneurysm. Mild retroperitoneal lymph nodes measuring up to 11 mm. Reproductive: Uterus unremarkable.  No adnexal mass. Other: Negative for  pelvic effusion or free air. Musculoskeletal: No acute osseous abnormality. IMPRESSION: 1. Negative for hydronephrosis or ureteral stone. Probable cortical scarring within the bilateral kidneys. 2. No CT evidence for acute intra-abdominopelvic abnormality. Electronically Signed   By: Donavan Foil M.D.   On: 02/09/2022 21:17   DG Femur Min 2 Views Left  Result Date: 02/09/2022 CLINICAL DATA:  Left-sided leg pain EXAM: LEFT FEMUR 2 VIEWS COMPARISON:  None. FINDINGS: There is no evidence of fracture or other focal bone lesions. Soft tissues are unremarkable. Vascular calcifications. Clips at the medial lower leg. IMPRESSION: No acute osseous abnormality Electronically Signed   By: Donavan Foil M.D.   On: 02/09/2022 18:16     Medications:    ampicillin-sulbactam (UNASYN) IV      amLODipine  10 mg Oral Daily   aspirin EC  81 mg Oral Daily   atorvastatin  80 mg Oral q1800   carvedilol  25 mg Oral BID   Chlorhexidine Gluconate Cloth  6 each Topical Q0600   furosemide  80 mg Oral Once per day on Sun Tue Thu Sat   heparin  5,000 Units Subcutaneous Q8H   insulin aspart  0-15 Units Subcutaneous TID WC   isosorbide mononitrate  30 mg Oral Daily   LORazepam  0.5 mg Oral Once   mirtazapine  30 mg Oral QHS   pantoprazole  40 mg Oral Daily   sucralfate  1 g Oral BID AC   topiramate  25 mg Oral Daily   acetaminophen **OR** acetaminophen, HYDROcodone-acetaminophen, morphine injection, naLOXone (NARCAN)  injection, ondansetron **OR** ondansetron (ZOFRAN) IV  Assessment/ Plan:  Terry Arroyo is a 61 y.o.  female past medical history including type 2 diabetes with polyneuropathy, depression, angioedema, heart failure, hypertension, and end-stage renal disease on dialysis.  Patient presents to the emergency department with extreme left thigh pain.  She will be admitted for ESRD (end stage renal disease) ([place]) [N18.6] Left leg cellulitis [L03.116] Abscess of left thigh [L02.416] Acute pain of left  thigh [M79.652]  [facility]/MWF/right aVF/76kg  End-stage renal disease on hemodialysis.  Patient received dialysis yesterday, UF goal 992 mL achieved.  Due to left thigh pain, patient received Tylenol 650 mg p.o. once during treatment.  Next treatment scheduled for Friday.  2. Anemia of chronic kidney disease Normocytic Lab Results  Component Value Date   HGB 10.9 (L) 02/11/2022  Mircera ordered outpatient Hemoglobin at goal  3. Secondary Hyperparathyroidism:  Lab Results  Component Value Date   CALCIUM 8.1 (L) 02/11/2022   CAION 1.09 (L) 05/13/2021   PHOS 3.9 10/29/2021     We will continue to monitor during this admission.  4.  Left leg cellulitis withocal collection within the vastus medialis muscle concerning for hematoma or abscess.  Recommend consulting orthopedics for possible aspiration.  Continue pain management and prescribed empiric antibiotic therapy. Attempted ultrasound aspiration of fluid without visible collection.  Will recommend possible CT-guided evaluation.    LOS: 1 Cayden Rautio 2/23/20231:20 PM

## 2022-02-12 ENCOUNTER — Inpatient Hospital Stay: Payer: Medicare HMO

## 2022-02-12 LAB — CBC WITH DIFFERENTIAL/PLATELET
Abs Immature Granulocytes: 0.03 10*3/uL (ref 0.00–0.07)
Basophils Absolute: 0 10*3/uL (ref 0.0–0.1)
Basophils Relative: 1 %
Eosinophils Absolute: 0.2 10*3/uL (ref 0.0–0.5)
Eosinophils Relative: 3 %
HCT: 36 % (ref 36.0–46.0)
Hemoglobin: 11.3 g/dL — ABNORMAL LOW (ref 12.0–15.0)
Immature Granulocytes: 0 %
Lymphocytes Relative: 12 %
Lymphs Abs: 0.9 10*3/uL (ref 0.7–4.0)
MCH: 28 pg (ref 26.0–34.0)
MCHC: 31.4 g/dL (ref 30.0–36.0)
MCV: 89.3 fL (ref 80.0–100.0)
Monocytes Absolute: 0.9 10*3/uL (ref 0.1–1.0)
Monocytes Relative: 13 %
Neutro Abs: 5.1 10*3/uL (ref 1.7–7.7)
Neutrophils Relative %: 71 %
Platelets: 245 10*3/uL (ref 150–400)
RBC: 4.03 MIL/uL (ref 3.87–5.11)
RDW: 14.9 % (ref 11.5–15.5)
WBC: 7.2 10*3/uL (ref 4.0–10.5)
nRBC: 0 % (ref 0.0–0.2)

## 2022-02-12 LAB — GLUCOSE, CAPILLARY
Glucose-Capillary: 121 mg/dL — ABNORMAL HIGH (ref 70–99)
Glucose-Capillary: 167 mg/dL — ABNORMAL HIGH (ref 70–99)
Glucose-Capillary: 119 mg/dL — ABNORMAL HIGH (ref 70–99)

## 2022-02-12 LAB — BASIC METABOLIC PANEL
Anion gap: 15 (ref 5–15)
BUN: 42 mg/dL — ABNORMAL HIGH (ref 6–20)
CO2: 26 mmol/L (ref 22–32)
Calcium: 8.4 mg/dL — ABNORMAL LOW (ref 8.9–10.3)
Chloride: 94 mmol/L — ABNORMAL LOW (ref 98–111)
Creatinine, Ser: 6.77 mg/dL — ABNORMAL HIGH (ref 0.44–1.00)
GFR, Estimated: 7 mL/min — ABNORMAL LOW (ref >60)
Glucose, Bld: 134 mg/dL — ABNORMAL HIGH (ref 70–99)
Potassium: 4.1 mmol/L (ref 3.5–5.1)
Sodium: 135 mmol/L (ref 135–145)

## 2022-02-12 IMAGING — CT CT FEMUR *L* W/ CM
3 of 6 series · 11 of 36 positions shown, 12 images · IV contrast (agent unspecified)
Comparison: MRI [DATE]

CLINICAL DATA: Soft tissue infection suspected.

EXAM:
CT OF THE LOWER RIGHT EXTREMITY WITH CONTRAST
TECHNIQUE: Multidetector CT imaging of the lower right extremity was performed
according to the standard protocol following intravenous contrast
administration.

[Series 4: axial bone · axial · 0.59mm/px · z∈[+486,+880]mm · 4 of 395 slices shown, 5 images]
[im 66/395  soft-tissue]
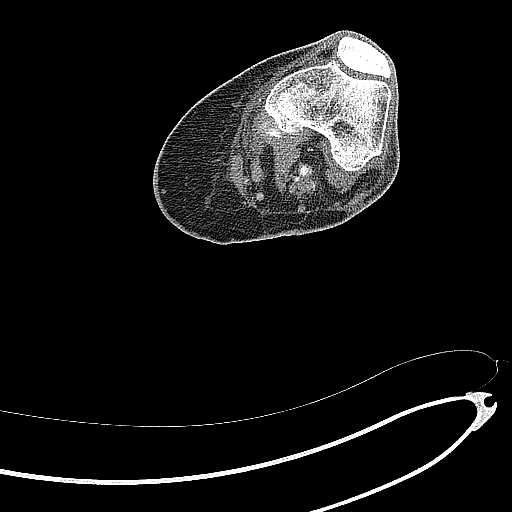
[im 66/395  bone]
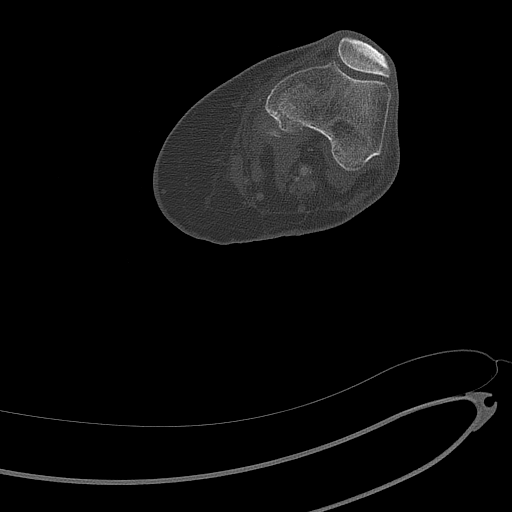
[im 132/395  bone]
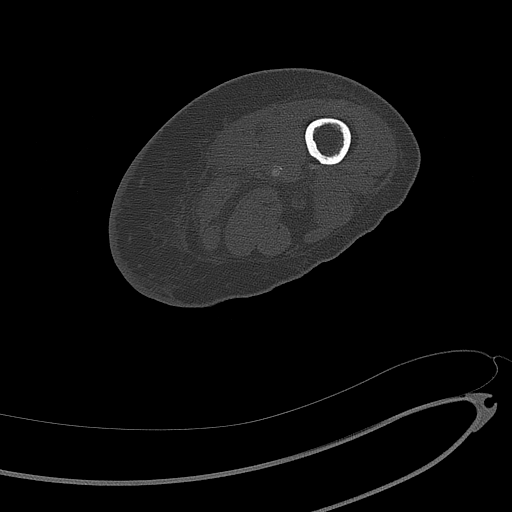
[im 263/395  bone]
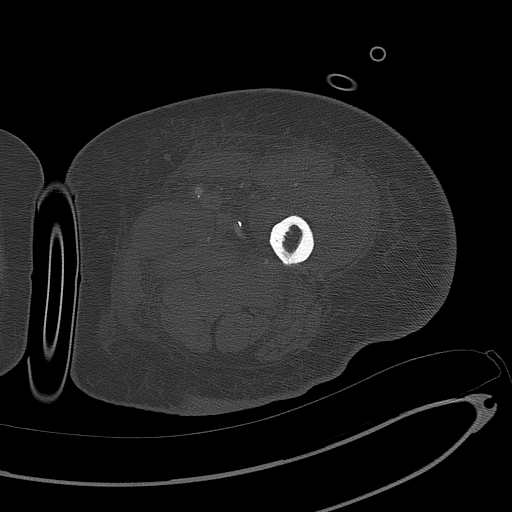
[im 329/395  bone]
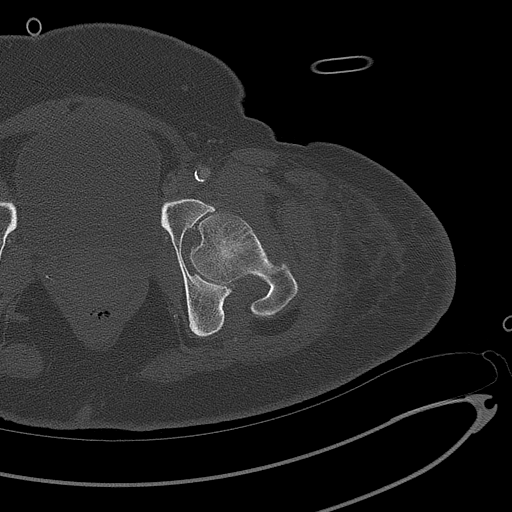

[Series 5: coronal bone · coronal · 0.71mm/px · 1 of 185 slices shown]
[im 93/185  bone]
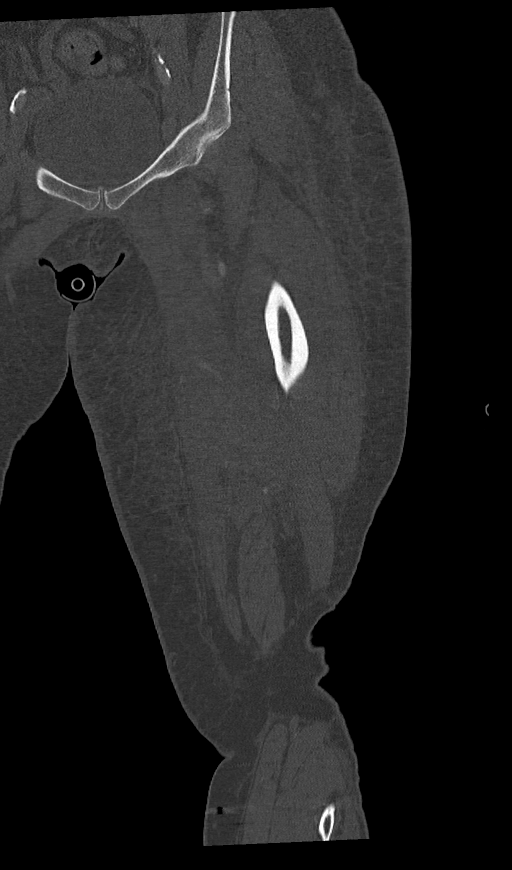

[Series 9: sagittal st · sagittal · 0.56mm/px · 6 of 182 slices shown]
[im 12/182  soft-tissue]
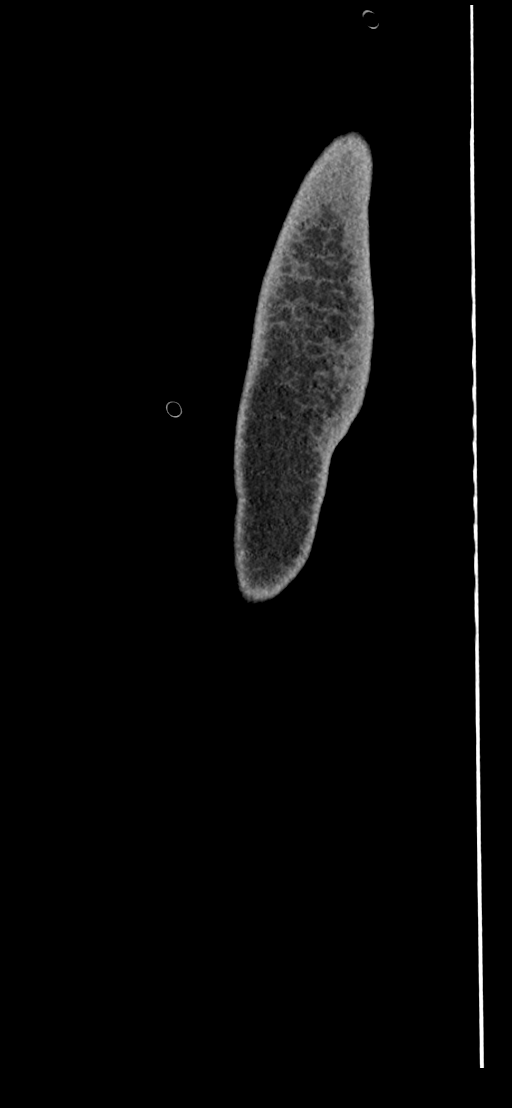
[im 31/182  bone]
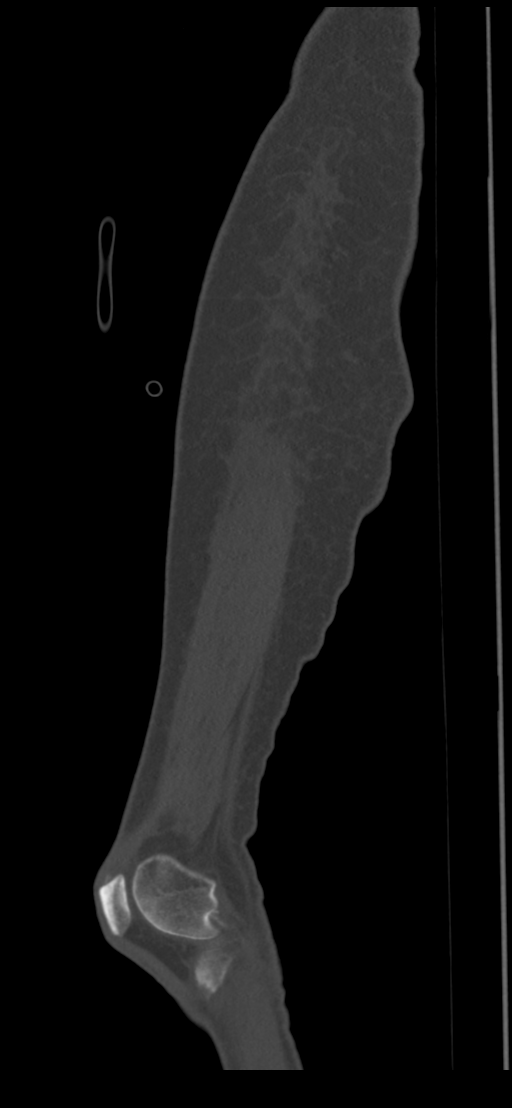
[im 61/182  bone]
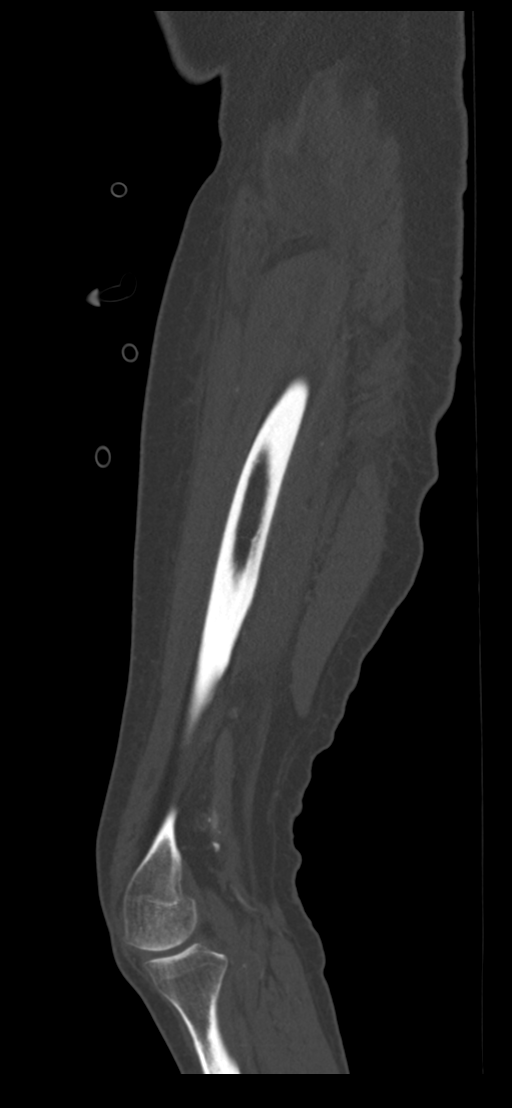
[im 91/182  bone]
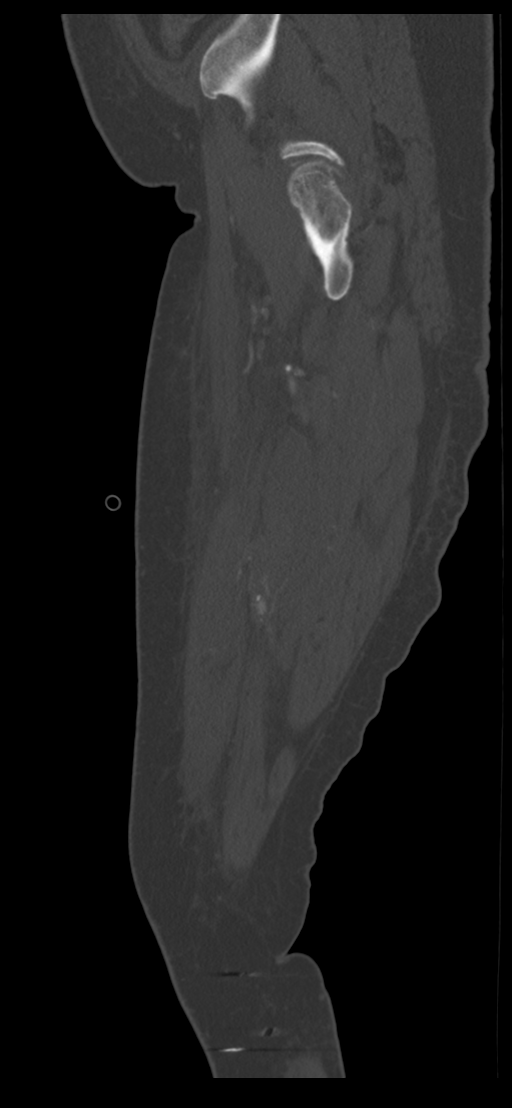
[im 121/182  bone]
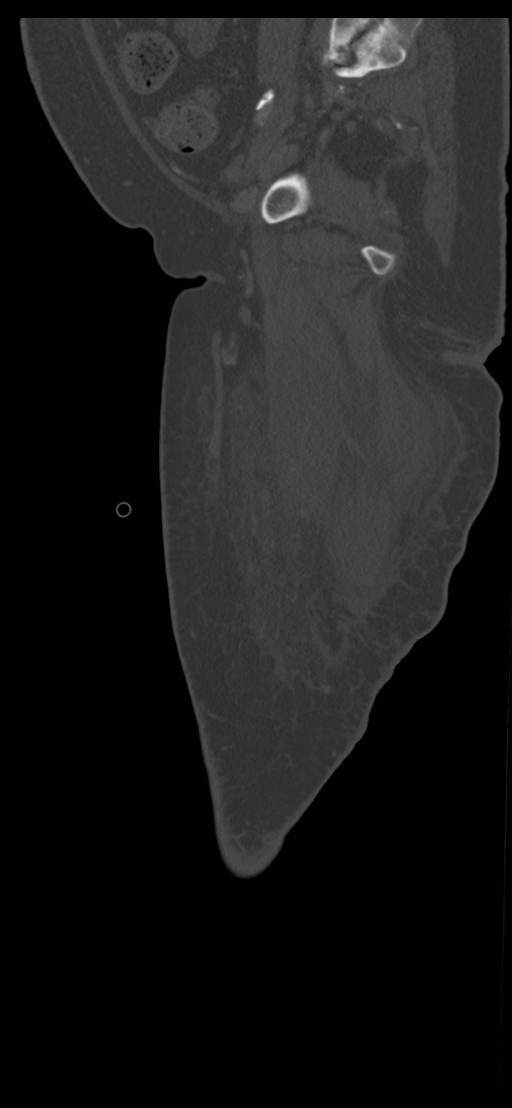
[im 151/182  bone]
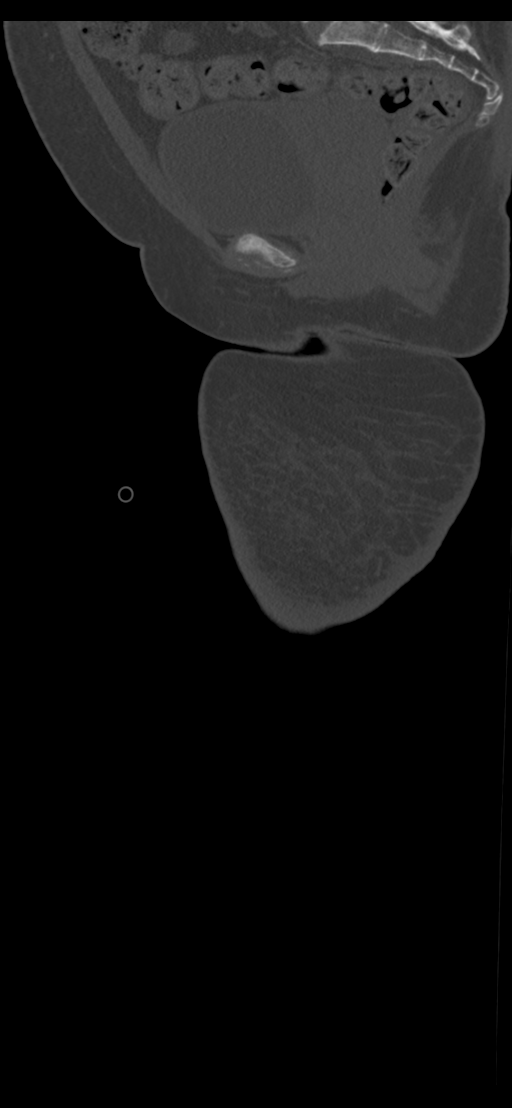

[11 of 36 positions shown; findings below may reference images not displayed]

RADIATION DOSE REDUCTION: This exam was performed according to the
departmental dose-optimization program which includes automated
exposure control, adjustment of the mA and/or kV according to
patient size and/or use of iterative reconstruction technique.

CONTRAST:  100mL OMNIPAQUE IOHEXOL 300 MG/ML  SOLN
FINDINGS: Bones/Joint/Cartilage

Grossly unremarkable

Ligaments

Suboptimally assessed by CT.

Muscles and Tendons

Edema again noted surrounding the anterior compartment musculature
as seen on recent MRI. The previously seen focal fluid collection
within the vastus medialis not appreciated by CT.

Soft tissues

Subcutaneous edema throughout the upper thighs circumferentially and
medially in the mid to lower thigh.
IMPRESSION: Edema primarily involving the anterior compartment musculature as
seen on recent MRI most compatible with myositis. The previously
described small vastus medialis fluid collection not appreciated by
CT. This could be further evaluated and possibly better seen with
MRI.

Overlying subcutaneous edema could reflect cellulitis.

## 2022-02-12 MED ORDER — IOHEXOL 300 MG/ML  SOLN
100.0000 mL | Freq: Once | INTRAMUSCULAR | Status: AC | PRN
  Administered 2022-02-12: 100 mL via INTRAVENOUS

## 2022-02-12 NOTE — Progress Notes (Signed)
Patient completes scheduled hemodialysis treatment without incident, achieves targeted UF. Offers concerns of pain to knee, medication administered per MD order with good relief. Patient returned to assigned room.

## 2022-02-12 NOTE — Progress Notes (Signed)
[facility]  ROUNDING NOTE   Subjective:   Terry Arroyo is a 61 year old female with past medical history including type 2 diabetes with polyneuropathy, depression, angioedema, heart failure, hypertension, and end-stage renal disease on dialysis.  Patient presents to the emergency department with extreme left thigh pain.  She will be admitted for ESRD (end stage renal disease) ([place]) [N18.6] Left leg cellulitis [L03.116] Abscess of left thigh [L02.416] Acute pain of left thigh [M79.652]  Patient is known to our practice and receives outpatient dialysis treatments at [facility] on a MWF schedule, supervised by Dr. Juleen China.  Last treatment received on Monday.   Patient seen and evaluated during dialysis   HEMODIALYSIS FLOWSHEET:  Blood Flow Rate (mL/min): 400 mL/min Arterial Pressure (mmHg): -220 mmHg Venous Pressure (mmHg): 190 mmHg Transmembrane Pressure (mmHg): 50 mmHg Ultrafiltration Rate (mL/min): 710 mL/min Dialysate Flow Rate (mL/min): 500 ml/min Conductivity: Machine : 13.9 Conductivity: Machine : 13.9 Dialysis Fluid Bolus: Normal Saline Bolus Amount (mL): 100 mL  Reports decreased pain in left thigh   Objective:  Vital signs in last 24 hours:  Temp:  [97.5 F (36.4 C)-98.6 F (37 C)] 98.1 F (36.7 C) (02/24 0900) Pulse Rate:  [59-77] 74 (02/24 1200) Resp:  [10-22] 18 (02/24 1200) BP: (96-144)/(54-124) 108/54 (02/24 1200) SpO2:  [89 %-100 %] 92 % (02/24 1200) Weight:  [79.9 kg] 79.9 kg (02/24 0900)  Weight change:  Filed Weights   02/10/22 1549 02/10/22 1824 02/12/22 0900  Weight: 80.6 kg 79.9 kg 79.9 kg    Intake/Output: I/O last 3 completed shifts: In: 1030 [P.O.:830; IV Piggyback:200] Out: -    Intake/Output this shift:  No intake/output data recorded.  Physical Exam: General: NAD  Head: Normocephalic, atraumatic. Moist oral mucosal membranes  Eyes: Anicteric  Lungs:  Clear to auscultation, normal effort, room air  Heart:  Regular rate and rhythm  Abdomen:  Soft, nontender, nondistended  Extremities: Left thigh edema  Neurologic: Alert, moving all four extremities  Skin: Left medial thigh erythema  Access: Right aVF    Basic Metabolic Panel: Recent Labs  Lab 02/09/22 1716 02/11/22 0403 02/12/22 0422  NA 136 135 135  K 4.5 4.1 4.1  CL 94* 93* 94*  CO2 27 26 26   GLUCOSE 104* 132* 134*  BUN 33* 27* 42*  CREATININE 6.18* 5.47* 6.77*  CALCIUM 8.8* 8.1* 8.4*     Liver Function Tests: Recent Labs  Lab 02/09/22 1716  AST 20  ALT 12  ALKPHOS 78  BILITOT 1.7*  PROT 8.1  ALBUMIN 3.7    Recent Labs  Lab 02/09/22 1716  LIPASE 25    No results for input(s): AMMONIA in the last 168 hours.  CBC: Recent Labs  Lab 02/09/22 1716 02/11/22 0403 02/12/22 0422  WBC 8.9 8.9 7.2  NEUTROABS  --  6.9 5.1  HGB 12.5 10.9* 11.3*  HCT 40.6 35.1* 36.0  MCV 91.2 89.8 89.3  PLT 279 240 245     Cardiac Enzymes: No results for input(s): CKTOTAL, CKMB, CKMBINDEX, TROPONINI in the last 168 hours.  BNP: Invalid input(s): POCBNP  CBG: Recent Labs  Lab 02/11/22 0829 02/11/22 1212 02/11/22 1653 02/11/22 2124 02/12/22 0801  GLUCAP 121* 107* 118* 153* 121*     Microbiology: Results for orders placed or performed during the hospital encounter of 02/09/22  MRSA Next Gen by PCR, Nasal     Status: None   Collection Time: 02/10/22  3:00 PM   Specimen: Nasal Mucosa; Nasal Swab  Result  Value Ref Range Status   MRSA by PCR Next Gen NOT DETECTED NOT DETECTED Final    Comment: (NOTE) The GeneXpert MRSA Assay (FDA approved for NASAL specimens only), is one component of a comprehensive MRSA colonization surveillance program. It is not intended to diagnose MRSA infection nor to guide or monitor treatment for MRSA infections. Test performance is not FDA approved in patients less than 2 years old. Performed at [facility], [address] [place], [place] [address]   Resp Panel by RT-PCR  (Flu A&B, Covid) Nasal Mucosa     Status: None   Collection Time: 02/10/22  3:00 PM   Specimen: Nasal Mucosa; Nasopharyngeal(NP) swabs in vial transport medium  Result Value Ref Range Status   SARS Coronavirus 2 by RT PCR NEGATIVE NEGATIVE Final    Comment: (NOTE) SARS-CoV-2 target nucleic acids are NOT DETECTED.  The SARS-CoV-2 RNA is generally detectable in upper respiratory specimens during the acute phase of infection. The lowest concentration of SARS-CoV-2 viral copies this assay can detect is 138 copies/mL. A negative result does not preclude SARS-Cov-2 infection and should not be used as the sole basis for treatment or other patient management decisions. A negative result may occur with  improper specimen collection/handling, submission of specimen other than nasopharyngeal swab, presence of viral mutation(s) within the areas targeted by this assay, and inadequate number of viral copies(<138 copies/mL). A negative result must be combined with clinical observations, patient history, and epidemiological information. The expected result is Negative.  Fact Sheet for Patients:  EntrepreneurPulse.com.au  Fact Sheet for Healthcare Providers:  IncredibleEmployment.be  This test is no t yet approved or cleared by the [place] FDA and  has been authorized for detection and/or diagnosis of SARS-CoV-2 by FDA under an Emergency Use Authorization (EUA). This EUA will remain  in effect (meaning this test can be used) for the duration of the COVID-19 declaration under Section 564(b)(1) of the Act, 21 U.S.C.section 360bbb-3(b)(1), unless the authorization is terminated  or revoked sooner.       Influenza A by PCR NEGATIVE NEGATIVE Final   Influenza B by PCR NEGATIVE NEGATIVE Final    Comment: (NOTE) The Xpert Xpress SARS-CoV-2/FLU/RSV plus assay is intended as an aid in the diagnosis of influenza from Nasopharyngeal swab specimens and should not  be used as a sole basis for treatment. Nasal washings and aspirates are unacceptable for Xpert Xpress SARS-CoV-2/FLU/RSV testing.  Fact Sheet for Patients: EntrepreneurPulse.com.au  Fact Sheet for Healthcare Providers: IncredibleEmployment.be  This test is not yet approved or cleared by the [place] FDA and has been authorized for detection and/or diagnosis of SARS-CoV-2 by FDA under an Emergency Use Authorization (EUA). This EUA will remain in effect (meaning this test can be used) for the duration of the COVID-19 declaration under Section 564(b)(1) of the Act, 21 U.S.C. section 360bbb-3(b)(1), unless the authorization is terminated or revoked.  Performed at [facility], [address] [place], [place] [address]   Culture, blood (routine x 2)     Status: None (Preliminary result)   Collection Time: 02/10/22  8:40 PM   Specimen: BLOOD  Result Value Ref Range Status   Specimen Description BLOOD LEFT HAND  Final   Special Requests   Final    BOTTLES DRAWN AEROBIC AND ANAEROBIC Blood Culture adequate volume   Culture   Final    NO GROWTH 2 DAYS Performed at [facility], [address], [place], [place] [address]    Report Status  PENDING  Incomplete  Culture, blood (routine x 2)     Status: None (Preliminary result)   Collection Time: 02/10/22 10:05 PM   Specimen: BLOOD  Result Value Ref Range Status   Specimen Description BLOOD LEFT WRIST  Final   Special Requests   Final    BOTTLES DRAWN AEROBIC AND ANAEROBIC Blood Culture adequate volume   Culture   Final    NO GROWTH 2 DAYS Performed at [facility], [address] [place], [place] [address]    Report Status PENDING  Incomplete    Coagulation Studies: No results for input(s): LABPROT, INR in the last 72 hours.  Urinalysis: No results for input(s): COLORURINE, LABSPEC, PHURINE, GLUCOSEU, HGBUR, BILIRUBINUR, KETONESUR, PROTEINUR, UROBILINOGEN,  NITRITE, LEUKOCYTESUR in the last 72 hours.  Invalid input(s): APPERANCEUR    Imaging: [place] LT LOWER EXTREM LTD SOFT TISSUE NON VASCULAR  Result Date: 02/10/2022 CLINICAL DATA:  61 year old with left leg pain. Concern for a fluid collection in the left thigh vastus medialis. Evaluate for percutaneous aspiration. EXAM: ULTRASOUND LEFT LOWER EXTREMITY LIMITED TECHNIQUE: Ultrasound examination of the lower extremity soft tissues was performed in the area of clinical concern. COMPARISON:  MRI 02/09/2022 FINDINGS: The area of concern is the medial left thigh. The area of concern is just medial and posterior to the left femoral vessels. No discrete fluid collection was identified in this area. It was very difficult to compress the soft tissues or the blood vessels in this area due to patient's discomfort. IMPRESSION: No discrete fluid collection identified at the area of concern in the medial left thigh. Aspiration not performed. Electronically Signed   By: Markus Daft M.D.   On: 02/10/2022 15:09     Medications:    ampicillin-sulbactam (UNASYN) IV Stopped (02/11/22 2159)    amLODipine  10 mg Oral Daily   aspirin EC  81 mg Oral Daily   atorvastatin  80 mg Oral q1800   carvedilol  25 mg Oral BID   Chlorhexidine Gluconate Cloth  6 each Topical Q0600   furosemide  80 mg Oral Once per day on Sun Tue Thu Sat   heparin  5,000 Units Subcutaneous Q8H   insulin aspart  0-15 Units Subcutaneous TID WC   isosorbide mononitrate  30 mg Oral Daily   LORazepam  0.5 mg Oral Once   mirtazapine  30 mg Oral QHS   pantoprazole  40 mg Oral Daily   sucralfate  1 g Oral BID AC   topiramate  25 mg Oral Daily   acetaminophen **OR** acetaminophen, HYDROcodone-acetaminophen, morphine injection, naLOXone (NARCAN)  injection, ondansetron **OR** ondansetron (ZOFRAN) IV  Assessment/ Plan:  Terry Arroyo is a 61 y.o.  female past medical history including type 2 diabetes with polyneuropathy, depression, angioedema,  heart failure, hypertension, and end-stage renal disease on dialysis.  Patient presents to the emergency department with extreme left thigh pain.  She will be admitted for ESRD (end stage renal disease) ([place]) [N18.6] Left leg cellulitis [L03.116] Abscess of left thigh [L02.416] Acute pain of left thigh [M79.652]  [facility]/MWF/right aVF/76kg  End-stage renal disease on hemodialysis.   Currently receiving dialysis, UF goal 1.5 L as tolerated.  Next treatment scheduled for Monday.  2. Anemia of chronic kidney disease Normocytic Lab Results  Component Value Date   HGB 11.3 (L) 02/12/2022  Mircera ordered outpatient Hemoglobin at goal.  No need for ESA's at this time  3. Secondary Hyperparathyroidism:  Lab Results  Component Value Date  CALCIUM 8.4 (L) 02/12/2022   CAION 1.09 (L) 05/13/2021   PHOS 3.9 10/29/2021    We will continue to monitor bone minerals.  4.  Left leg cellulitis withocal collection within the vastus medialis muscle concerning for hematoma or abscess.  Recommend consulting orthopedics for possible aspiration.  Continue pain management and prescribed empiric antibiotic therapy. Orthopedics/IR consulted however no visible fluid seen on ultrasound.  Improved discomfort, continue antibiotics    LOS: 2 Addysin Porco 2/24/202312:04 PM

## 2022-02-12 NOTE — Plan of Care (Signed)
  Problem: Clinical Measurements: Goal: Ability to avoid or minimize complications of infection will improve Outcome: Progressing   

## 2022-02-12 NOTE — Evaluation (Signed)
Physical Therapy Evaluation Patient Details Name: Terry Arroyo MRN: [id number] DOB: Jul 02, 1961 Today's Date: 02/12/2022  History of Present Illness  Pt is a 61 y/o F admitted on 02/09/22 with after presenting with c/c of L side thigh pain & pt was hypertensive on presentation. CT of L femur was negative for acute abnormality. Pt is being treated for L thigh cellulitis & was started on antibiotics. MRI showed diffuse edema primarily involving the anterior compartment musculature of the left thigh, 2.9 x 1.9 cm focal collection within the vastus medialis which could be an intramuscular hematoma or abscess. Orthopedics consulted & IR consulted for possible drainage but thigh [place] did not show any focal fluid so procedure not done. PMH: ESRD on HD MWF, systolic CHF, HTN, DM2, depression, COPD, blind L eye, HFrEF, CABG x 4 (2018), stroke affecting L side (2014)  Clinical Impression  Pt seen for PT evaluation with pt received asleep but awakens with extra time/cuing. Pt reports prior to admission she lived alone in apartment on 2nd level with elevator access & performs all functional tasks on her own, ambulating with rollator & cooking from seated position (on rollator or w/c). On this date, pt endorses significant LLE thigh/knee pain and doesn't tolerate L knee flexion. Pt is able to come to sitting EOB with LLE fully elevated on bed as she is unable to flex knee to put it on floor. Pt crying 2/2 significant pain but wishing to get OOB. Pt completes lateral scoot bed>drop arm recliner with LLE support to maintain extension, cuing from PT for proper technique of transfer, but pt still experiencing increased pain & crying again. Pt left in recliner with BLE elevated for comfort & notified nurse of pt's request for pain medication. Will continue to follow pt acutely to progress transfers & gait as able, but at this time, recommend STR upon d/c to maximize independence with functional mobility & reduce fall risk prior  to return home alone.       Recommendations for follow up therapy are one component of a multi-disciplinary discharge planning process, led by the attending physician.  Recommendations may be updated based on patient status, additional functional criteria and insurance authorization.  Follow Up Recommendations Skilled nursing-short term rehab (<3 hours/day)    Assistance Recommended at Discharge Intermittent Supervision/Assistance  Patient can return home with the following  A lot of help with walking and/or transfers;A lot of help with bathing/dressing/bathroom;Assist for transportation;Assistance with cooking/housework;Help with stairs or ramp for entrance    Equipment Recommendations  (TBD in next venue)  Recommendations for Other Services  OT consult    Functional Status Assessment Patient has had a recent decline in their functional status and demonstrates the ability to make significant improvements in function in a reasonable and predictable amount of time.     Precautions / Restrictions Precautions Precautions: Fall Restrictions Weight Bearing Restrictions: No      Mobility  Bed Mobility Overal bed mobility: Needs Assistance             General bed mobility comments: Pt able to come to long sitting on bed with supervision with use of HOB elevated, bed rails PRN.    Transfers Overall transfer level: Needs assistance Equipment used: None Transfers: Bed to chair/wheelchair/BSC            Lateral/Scoot Transfers: Min assist General transfer comment: Pt uses towel to support LLE then with PT assisting with supporting LLE as pt unable to tolerate L knee flexion. Pt performs  lateral scoot bed>drop arm recliner on R with min assist with min cuing for hand placement and technique. Pt limited by LLE knee/thigh pain.    Ambulation/Gait                  Stairs            Wheelchair Mobility    Modified Rankin (Stroke Patients Only)        Balance Overall balance assessment: Needs assistance Sitting-balance support: Feet supported, Bilateral upper extremity supported Sitting balance-Leahy Scale: Fair                                       Pertinent Vitals/Pain Pain Assessment Pain Assessment: Faces Faces Pain Scale: Hurts worst Pain Location: L thigh Pain Descriptors / Indicators: Discomfort, Crying Pain Intervention(s): Monitored during session, Repositioned    Home Living Family/patient expects to be discharged to:: Private residence Living Arrangements: Alone   Type of Home: Apartment (on 2nd level, elevator access)         Home Layout: One level Home Equipment: Rollator (4 wheels);Wheelchair - manual      Prior Function Prior Level of Function : Independent/Modified Independent             Mobility Comments: Pt reports she lives alone without family/friends nearby or available to assist. Pt reports she ambulates with rollator & performs cooking from w/c or rollator level while sitting.       Hand Dominance        Extremity/Trunk Assessment   Upper Extremity Assessment Upper Extremity Assessment: Generalized weakness    Lower Extremity Assessment Lower Extremity Assessment: Generalized weakness (unable to test LLE 2/2 pain, pt unable to tolerate L knee flexion)       Communication   Communication:  (initially with decreased volume and poor clarity of words)  Cognition Arousal/Alertness: Awake/alert Behavior During Therapy: Flat affect Overall Cognitive Status: Within Functional Limits for tasks assessed                                 General Comments: Received asleep, requires cuing to awaken.        General Comments      Exercises     Assessment/Plan    PT Assessment Patient needs continued PT services  PT Problem List Decreased strength;Decreased safety awareness;Decreased mobility;Decreased activity tolerance;Decreased balance;Decreased  range of motion;Decreased knowledge of use of DME;Pain;Cardiopulmonary status limiting activity       PT Treatment Interventions DME instruction;Therapeutic exercise;Gait training;Balance training;Stair training;Neuromuscular re-education;Modalities;Manual techniques;Functional mobility training;Therapeutic activities;Patient/family education    PT Goals (Current goals can be found in the Care Plan section)  Acute Rehab PT Goals Patient Stated Goal: decreased pain PT Goal Formulation: With patient Time For Goal Achievement: 02/26/22 Potential to Achieve Goals: Good    Frequency Min 2X/week     Co-evaluation               AM-PAC PT "6 Clicks" Mobility  Outcome Measure Help needed turning from your back to your side while in a flat bed without using bedrails?: A Little Help needed moving from lying on your back to sitting on the side of a flat bed without using bedrails?: A Little Help needed moving to and from a bed to a chair (including a wheelchair)?: A Little Help needed standing up from a  chair using your arms (e.g., wheelchair or bedside chair)?: Total Help needed to walk in hospital room?: Total Help needed climbing 3-5 steps with a railing? : Total 6 Click Score: 12    End of Session   Activity Tolerance: Patient limited by pain Patient left: in chair;with chair alarm set;with call bell/phone within reach Nurse Communication: Mobility status PT Visit Diagnosis: Difficulty in walking, not elsewhere classified (R26.2);Muscle weakness (generalized) (M62.81);Pain Pain - Right/Left: Left Pain - part of body: Leg    Time: 0903-0149 PT Time Calculation (min) (ACUTE ONLY): 16 min   Charges:   PT Evaluation $PT Eval Low Complexity: Bryant, PT, DPT 02/12/22, 3:42 PM   [name] 02/12/2022, 3:40 PM

## 2022-02-12 NOTE — Plan of Care (Signed)
Patient states can move left extremity more than yesterday. HD today.

## 2022-02-12 NOTE — Progress Notes (Signed)
IR received request to re-evaluate this patient for possible left thigh aspiration. Small fluid collection identified on MR imaging but was not seen on CT. Dr. Anselm Pancoast attempted ultrasound-guided evaluation/aspiration 02/10/22 and did not see a fluid collection in the area of concern.   IR recommends re-consulting orthopedic team and/or ordering repeat CT with contrast.   No IR procedure planned and the order will be deleted. Dr. Tawanna Solo notified via Epic chat.   Soyla Dryer, [place] [phone] 02/12/2022, 4:33 PM

## 2022-02-12 NOTE — TOC Progression Note (Signed)
Transition of Care [facility]) - Progression Note    Patient Details  Name: Terry Arroyo MRN: [id number] Date of Birth: 12-May-1961  Transition of [facility] CM/SW Contact  Beverly Sessions, RN Phone Number: 02/12/2022, 1:48 PM  Clinical Narrative:    Per adapt patient received rollator in 11/22.  Does not qualify for a new walker.  Patient declines to pay out of pocket         Expected Discharge Plan and Services                                                 Social Determinants of Health (SDOH) Interventions    Readmission Risk Interventions Readmission Risk Prevention Plan 10/29/2021 11/03/2020 10/23/2020  Transportation Screening Complete Complete Complete  Medication Review Press photographer) Complete Complete Complete  PCP or Specialist appointment within 3-5 days of discharge Complete Not Complete Complete  PCP/Specialist Appt Not Complete comments - To be scheduled by unit secretary. [name] or [place] - Complete Complete  SW Recovery Care/Counseling Consult Complete Complete Complete  Palliative Care Screening Not Applicable Not Applicable Not [place] Not Applicable Patient Refused Not Applicable  SNF Comments - Patient refuses states she is able to take care of herself. -  Some recent data might be hidden

## 2022-02-12 NOTE — Progress Notes (Signed)
PROGRESS NOTE  Terry Arroyo  OMV:[id number] DOB: [birth year] DOA: 02/09/2022 PCP: Kirk Ruths, MD   Brief Narrative: Patient is a 61 year old female with history of ESRD on dialysis on Monday, Wednesday, Friday, systolic congestive heart failure, hypertension, diabetes type 2, depression, COPD who presented with pain on the left thigh.  On presentation she was hypertensive.  CT left femur did not show any acute abnormality.  Also noted to have lower extremity edema.  Presentation was consistent with cellulitis of the left thigh and was started on antibiotics.  MRI showed diffuse edema primarily involving the anterior compartment musculature of the left thigh,  2.9 x 1.9 cm focal collection within the vastus medialis which could be an intramuscular hematoma or abscess.  Orthopedics consulted ,IR consulted for possible drainage.  Ultrasound of the thigh did not show any focal fluid collection so procedure not done.  Currently on IV antibiotics.  Left thigh pain and edema much better with IV antibiotics.  PT consulted today  Assessment & Plan:  Principal Problem:   Acute pain of left thigh Active Problems:   Type 2 diabetes mellitus with hyperlipidemia (HCC)   Essential hypertension   HFrEF (heart failure with reduced ejection fraction) (HCC)   CAD (coronary artery disease)   ESRD on dialysis ([place])   Cellulitis of left thigh   Hx of CABG   Assessment and Plan: * Acute pain of left thigh- (present on admission) Seen by vascular with low suspicion for vascular etiology.  Negative for DVT on 2/14.  Venous Doppler on 2/21 showed grossly patent deep veins, short segment of nonocclusive thrombus/thrombophlebitis within the greater saphenous vein. MRI showed MRI showed diffuse edema primarily involving the anterior compartment musculature of the left thigh,  2.9 x 1.9 cm focal collection within the vastus medialis which could be an intramuscular hematoma or abscess.  Orthopedics /IR  consulted, ultrasound did not show any abscess or collection so procedure canceled. Her left thigh looks much better today.  Pain has improved. Continue broad spectrum abx.F/U cultures  Cellulitis of left thigh CT showed no acute bony injury.MRI showed possible abscess versus hematoma. [place] did not show any fluid collection  ESRD on dialysis [facility]) Nephrology consult for continuation of dialysis  CAD (coronary artery disease)- (present on admission) Patient has history of CABG Continue aspirin, atorvastatin, carvedilol and Imdur  HFrEF (heart failure with reduced ejection fraction) ([place])- (present on admission) Currently euvolemic Continue furosemide, carvedilol, Imdur  Essential hypertension- (present on admission) Continue amlodipine and carvedilol  Type 2 diabetes mellitus with hyperlipidemia ([place])- (present on admission) Sliding scale insulin coverage for now              DVT prophylaxis:heparin injection 5,000 Units Start: 02/09/22 2300     Code Status: Full Code  Family Communication: None at the bedside  Patient status: Inpatient  Patient is from : Home  Anticipated discharge to: Home  Estimated DC date: 1 to 2 days, awaiting for PT evaluation today.  Will continue IV antibiotics for today   Consultants: Orthopedics,IR  Procedures: None   Antimicrobials:  Anti-infectives (From admission, onward)    Start     Dose/Rate Route Frequency Ordered Stop   02/12/22 1200  vancomycin (VANCOREADY) IVPB 750 mg/150 mL  Status:  Discontinued        750 mg 150 mL/hr over 60 Minutes Intravenous Every M-W-F (Hemodialysis) 02/11/22 0711 02/11/22 0930   02/11/22 2200  Ampicillin-Sulbactam (UNASYN) 3 g in sodium chloride 0.9 % 100 mL IVPB  3 g 200 mL/hr over 30 Minutes Intravenous Every 24 hours 02/11/22 0930     02/10/22 2200  cefTRIAXone (ROCEPHIN) 1 g in sodium chloride 0.9 % 100 mL IVPB  Status:  Discontinued        1 g 200 mL/hr over 30 Minutes Intravenous  Every 24 hours 02/09/22 2246 02/10/22 1158   02/10/22 2100  vancomycin (VANCOREADY) IVPB 2000 mg/400 mL        2,000 mg 200 mL/hr over 120 Minutes Intravenous  Once 02/10/22 1650 02/10/22 2253   02/10/22 1400  ceFEPIme (MAXIPIME) 1 g in sodium chloride 0.9 % 100 mL IVPB  Status:  Discontinued        1 g 200 mL/hr over 30 Minutes Intravenous Every 24 hours 02/10/22 1237 02/11/22 0930   02/10/22 1245  vancomycin (VANCOREADY) IVPB 2000 mg/400 mL  Status:  Discontinued        2,000 mg 200 mL/hr over 120 Minutes Intravenous  Once 02/10/22 1237 02/10/22 1650   02/09/22 2200  cefTRIAXone (ROCEPHIN) 2 g in sodium chloride 0.9 % 100 mL IVPB        2 g 200 mL/hr over 30 Minutes Intravenous  Once 02/09/22 2155 02/09/22 2248       Subjective:  Patient seen and examined at the bedside this morning.  Hemodynamically stable.  She was at dialysis.  She feels much better today.  She was happy that she was able to raise her left lower extremity.  There was no significant tenderness on the left thigh and swelling has significantly improved.  objective: Vitals:   02/12/22 1045 02/12/22 1100 02/12/22 1111 02/12/22 1115  BP: (!) 144/124 137/64  125/62  Pulse: 74 75 72 75  Resp: 12 (!) 22 20 14   Temp:      TempSrc:      SpO2: 96% 96% 99% 98%  Weight:      Height:        Intake/Output Summary (Last 24 hours) at 02/12/2022 1134 Last data filed at 02/12/2022 0419 Gross per 24 hour  Intake 460 ml  Output --  Net 460 ml   Filed Weights   02/10/22 1549 02/10/22 1824 02/12/22 0900  Weight: 80.6 kg 79.9 kg 79.9 kg    Examination:  General exam: Overall comfortable, not in distress HEENT: PERRL Respiratory system:  no wheezes or crackles  Cardiovascular system: S1 & S2 heard, RRR.  Gastrointestinal system: Abdomen is nondistended, soft and nontender. Central nervous system: Alert and oriented Extremities: Mild edema of left thigh, no clubbing ,no cyanosis, AV fistula on the right arm Skin: No  rashes, no ulcers,no icterus    Data Reviewed: I have personally reviewed following labs and imaging studies  CBC: Recent Labs  Lab 02/09/22 1716 02/11/22 0403 02/12/22 0422  WBC 8.9 8.9 7.2  NEUTROABS  --  6.9 5.1  HGB 12.5 10.9* 11.3*  HCT 40.6 35.1* 36.0  MCV 91.2 89.8 89.3  PLT 279 240 379   Basic Metabolic Panel: Recent Labs  Lab 02/09/22 1716 02/11/22 0403 02/12/22 0422  NA 136 135 135  K 4.5 4.1 4.1  CL 94* 93* 94*  CO2 27 26 26   GLUCOSE 104* 132* 134*  BUN 33* 27* 42*  CREATININE 6.18* 5.47* 6.77*  CALCIUM 8.8* 8.1* 8.4*     Recent Results (from the past 240 hour(s))  MRSA Next Gen by PCR, Nasal     Status: None   Collection Time: 02/10/22  3:00 PM   Specimen: Nasal Mucosa;  Nasal Swab  Result Value Ref Range Status   MRSA by PCR Next Gen NOT DETECTED NOT DETECTED Final    Comment: (NOTE) The GeneXpert MRSA Assay (FDA approved for NASAL specimens only), is one component of a comprehensive MRSA colonization surveillance program. It is not intended to diagnose MRSA infection nor to guide or monitor treatment for MRSA infections. Test performance is not FDA approved in patients less than 35 years old. Performed at [facility], [address] [place], [place] [address]   Resp Panel by RT-PCR (Flu A&B, Covid) Nasal Mucosa     Status: None   Collection Time: 02/10/22  3:00 PM   Specimen: Nasal Mucosa; Nasopharyngeal(NP) swabs in vial transport medium  Result Value Ref Range Status   SARS Coronavirus 2 by RT PCR NEGATIVE NEGATIVE Final    Comment: (NOTE) SARS-CoV-2 target nucleic acids are NOT DETECTED.  The SARS-CoV-2 RNA is generally detectable in upper respiratory specimens during the acute phase of infection. The lowest concentration of SARS-CoV-2 viral copies this assay can detect is 138 copies/mL. A negative result does not preclude SARS-Cov-2 infection and should not be used as the sole basis for treatment or other patient management  decisions. A negative result may occur with  improper specimen collection/handling, submission of specimen other than nasopharyngeal swab, presence of viral mutation(s) within the areas targeted by this assay, and inadequate number of viral copies(<138 copies/mL). A negative result must be combined with clinical observations, patient history, and epidemiological information. The expected result is Negative.  Fact Sheet for Patients:  EntrepreneurPulse.com.au  Fact Sheet for Healthcare Providers:  IncredibleEmployment.be  This test is no t yet approved or cleared by the [place] FDA and  has been authorized for detection and/or diagnosis of SARS-CoV-2 by FDA under an Emergency Use Authorization (EUA). This EUA will remain  in effect (meaning this test can be used) for the duration of the COVID-19 declaration under Section 564(b)(1) of the Act, 21 U.S.C.section 360bbb-3(b)(1), unless the authorization is terminated  or revoked sooner.       Influenza A by PCR NEGATIVE NEGATIVE Final   Influenza B by PCR NEGATIVE NEGATIVE Final    Comment: (NOTE) The Xpert Xpress SARS-CoV-2/FLU/RSV plus assay is intended as an aid in the diagnosis of influenza from Nasopharyngeal swab specimens and should not be used as a sole basis for treatment. Nasal washings and aspirates are unacceptable for Xpert Xpress SARS-CoV-2/FLU/RSV testing.  Fact Sheet for Patients: EntrepreneurPulse.com.au  Fact Sheet for Healthcare Providers: IncredibleEmployment.be  This test is not yet approved or cleared by the [place] FDA and has been authorized for detection and/or diagnosis of SARS-CoV-2 by FDA under an Emergency Use Authorization (EUA). This EUA will remain in effect (meaning this test can be used) for the duration of the COVID-19 declaration under Section 564(b)(1) of the Act, 21 U.S.C. section 360bbb-3(b)(1), unless the  authorization is terminated or revoked.  Performed at [facility], [address] [place], [place] [address]   Culture, blood (routine x 2)     Status: None (Preliminary result)   Collection Time: 02/10/22  8:40 PM   Specimen: BLOOD  Result Value Ref Range Status   Specimen Description BLOOD LEFT HAND  Final   Special Requests   Final    BOTTLES DRAWN AEROBIC AND ANAEROBIC Blood Culture adequate volume   Culture   Final    NO GROWTH 2 DAYS Performed at [facility], [address] [place],  [address]  Report Status PENDING  Incomplete  Culture, blood (routine x 2)     Status: None (Preliminary result)   Collection Time: 02/10/22 10:05 PM   Specimen: BLOOD  Result Value Ref Range Status   Specimen Description BLOOD LEFT WRIST  Final   Special Requests   Final    BOTTLES DRAWN AEROBIC AND ANAEROBIC Blood Culture adequate volume   Culture   Final    NO GROWTH 2 DAYS Performed at [facility], [place], [place] [address]    Report Status PENDING  Incomplete     Radiology Studies: [place] LT LOWER EXTREM LTD SOFT TISSUE NON VASCULAR  Result Date: 02/10/2022 CLINICAL DATA:  61 year old with left leg pain. Concern for a fluid collection in the left thigh vastus medialis. Evaluate for percutaneous aspiration. EXAM: ULTRASOUND LEFT LOWER EXTREMITY LIMITED TECHNIQUE: Ultrasound examination of the lower extremity soft tissues was performed in the area of clinical concern. COMPARISON:  MRI 02/09/2022 FINDINGS: The area of concern is the medial left thigh. The area of concern is just medial and posterior to the left femoral vessels. No discrete fluid collection was identified in this area. It was very difficult to compress the soft tissues or the blood vessels in this area due to patient's discomfort. IMPRESSION: No discrete fluid collection identified at the area of concern in the medial left thigh. Aspiration not performed. Electronically  Signed   By: Markus Daft M.D.   On: 02/10/2022 15:09    Scheduled Meds:  amLODipine  10 mg Oral Daily   aspirin EC  81 mg Oral Daily   atorvastatin  80 mg Oral q1800   carvedilol  25 mg Oral BID   Chlorhexidine Gluconate Cloth  6 each Topical Q0600   furosemide  80 mg Oral Once per day on Sun Tue Thu Sat   heparin  5,000 Units Subcutaneous Q8H   insulin aspart  0-15 Units Subcutaneous TID WC   isosorbide mononitrate  30 mg Oral Daily   LORazepam  0.5 mg Oral Once   mirtazapine  30 mg Oral QHS   pantoprazole  40 mg Oral Daily   sucralfate  1 g Oral BID AC   topiramate  25 mg Oral Daily   Continuous Infusions:  ampicillin-sulbactam (UNASYN) IV Stopped (02/11/22 2159)     LOS: 2 days   Shelly Coss, MD [facility] P2/24/2023, 11:34 AM

## 2022-02-13 DIAGNOSIS — R5381 Other malaise: Secondary | ICD-10-CM

## 2022-02-13 LAB — GLUCOSE, CAPILLARY
Glucose-Capillary: 90 mg/dL (ref 70–99)
Glucose-Capillary: 108 mg/dL — ABNORMAL HIGH (ref 70–99)
Glucose-Capillary: 127 mg/dL — ABNORMAL HIGH (ref 70–99)
Glucose-Capillary: 97 mg/dL (ref 70–99)
Glucose-Capillary: 91 mg/dL (ref 70–99)

## 2022-02-13 MED ORDER — MELATONIN 5 MG PO TABS
5.0000 mg | ORAL_TABLET | Freq: Every evening | ORAL | Status: DC | PRN
  Filled 2022-02-13: qty 1

## 2022-02-13 MED ORDER — SENNA 8.6 MG PO TABS
1.0000 | ORAL_TABLET | Freq: Two times a day (BID) | ORAL | Status: DC
  Administered 2022-02-13 – 2022-02-15 (×5): 8.6 mg via ORAL
  Filled 2022-02-13 (×6): qty 1

## 2022-02-13 MED ORDER — POLYETHYLENE GLYCOL 3350 17 G PO PACK
17.0000 g | PACK | Freq: Every day | ORAL | Status: DC
  Administered 2022-02-13 – 2022-02-14 (×2): 17 g via ORAL
  Filled 2022-02-13 (×3): qty 1

## 2022-02-13 NOTE — Assessment & Plan Note (Signed)
Patient seen by PT and recommended skilled nursing facility on discharge

## 2022-02-13 NOTE — TOC Progression Note (Signed)
Transition of Care [facility]) - Progression Note    Patient Details  Name: Terry Arroyo MRN: [id number] Date of Birth: September 10, 1961  Transition of Care [facility]) CM/SW [name], [name] Phone Number: 02/13/2022, 1:17 PM  Clinical Narrative:  CSW spoke with patient who agree's with SNF recommendation from PT. CSW started SNF bed search      Expected Discharge Plan: [place] Barriers to Discharge: Continued Medical Work up  Expected Discharge Plan and Services Expected Discharge Plan: [place]       Living arrangements for the past 2 months: Apartment                                       Social Determinants of Health (SDOH) Interventions    Readmission Risk Interventions Readmission Risk Prevention Plan 10/29/2021 11/03/2020 10/23/2020  Transportation Screening Complete Complete Complete  Medication Review Press photographer) Complete Complete Complete  PCP or Specialist appointment within 3-5 days of discharge Complete Not Complete Complete  PCP/Specialist Appt Not Complete comments - To be scheduled by unit secretary. [name] or [place] - Complete Complete  SW Recovery Care/Counseling Consult Complete Complete Complete  Palliative Care Screening Not Applicable Not Applicable Not [place] Not Applicable Patient Refused Not Applicable  SNF Comments - Patient refuses states she is able to take care of herself. -  Some recent data might be hidden

## 2022-02-13 NOTE — NC FL2 (Signed)
[place] LEVEL OF CARE SCREENING TOOL     IDENTIFICATION  Patient Name: BELA Arroyo Birthdate: 19-Jun-1961 Sex: female Admission Date (Current Location): 02/09/2022  [facility] and [place] Number:  Engineering geologist and Address:  [facility], [address], [place], [place] [address]      Provider Number: [id number]  Attending Physician Name and Address:  Shelly Coss, MD  Relative Name and Phone Number:  Cristobal Goldmann, [phone]    Current Level of Care: Hospital Recommended Level of Care: [place] Prior Approval Number:    Date Approved/Denied:   PASRR Number: [id number]  Discharge Plan: SNF    Current Diagnoses: Patient Active Problem List   Diagnosis Date Noted   Physical debility 02/13/2022   Acute pain of left thigh 02/09/2022   Cellulitis of left thigh 02/09/2022   Atherosclerosis of native arteries of extremity with intermittent claudication ([place]) 02/04/2022   Leg pain, anterior, left 02/04/2022   Angioedema 10/28/2021   Lactic acidosis    Diabetic polyneuropathy associated with type 2 diabetes mellitus ([place])    Sepsis ([place]) 09/05/2021   Acute CHF (congestive heart failure) ([place]) 09/05/2021   Fluid overload 06/11/2021   Hypokalemia 06/11/2021   Acute pulmonary edema (HCC)    COPD (chronic obstructive pulmonary disease) with emphysema ([place]) 02/09/2021   Chronic sphenoidal sinusitis 01/01/2021   Contact dermatitis and other eczema due to other specified agent 01/01/2021   Hydronephrosis, left 01/01/2021   Other diseases of nasal cavity and sinuses(478.19) 01/01/2021   ESRD on dialysis ([place])    Reactive thrombocytosis 11/08/2020   Nephrotic syndrome 11/04/2020   SOB (shortness of breath) 11/01/2020   Acute on chronic combined systolic and diastolic CHF (congestive heart failure) ([place]) 10/21/2020   CAD (coronary artery disease) 10/21/2020   Hyperlipidemia associated with type 2  diabetes mellitus ([place]) 10/21/2020   HFrEF (heart failure with reduced ejection fraction) (HCC)    Acute respiratory distress    COPD exacerbation ([place]) 10/10/2020   Kidney hematoma 09/13/2020   Essential hypertension    Acute renal failure superimposed on stage 4 chronic kidney disease (HCC)    Hematuria 09/12/2020   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[place]) 09/03/2020   Blind left eye 09/03/2020   Brain aneurysm 09/03/2020   Esotropia 09/03/2020   Lesion of ulnar nerve 09/03/2020   Mixed hyperlipidemia 09/03/2020   Sensory hearing loss, bilateral 09/03/2020   Tear film insufficiency 09/03/2020   Health maintenance examination 06/27/2020   Secondary hyperparathyroidism of renal origin ([place]) 04/24/2020   Anemia in chronic kidney disease 03/10/2020   Benign hypertensive kidney disease with chronic kidney disease 03/10/2020   Hyposmolality and/or hyponatremia 03/10/2020   Stage 3b chronic kidney disease ([place]) 03/10/2020   Calf tenderness    Acute on chronic heart failure ([place]) 02/27/2020   Leg edema    Type 2 diabetes mellitus with hyperlipidemia ([place]) 02/26/2020   CKD (chronic kidney disease) stage 4, GFR 15-29 ml/min (HCC) 02/26/2020   Hyponatremia 02/26/2020   Anasarca 02/26/2020   Proteinuria 02/26/2020   Hypoalbuminemia 02/26/2020   Elevated troponin 02/26/2020   Acute on chronic systolic CHF (congestive heart failure) ([place]) 02/26/2020   Aortic atherosclerosis ([place]) 02/18/2020   Hyperglycemia    Nausea    Hypertensive emergency    Myalgia due to statin 12/04/2019   Hypertensive urgency 11/24/2019   Chest pain 11/23/2019   OSA (obstructive sleep apnea) 04/10/2019   Mild episode of recurrent major depressive  disorder ([place]) 04/10/2019   ACS (acute coronary syndrome) ([place]) 03/24/2019   COVID-19 virus infection 03/24/2019   Hypertensive heart/kidney disease w/chronic kidney disease stage III ([place]) 10/21/2017   Mild nonproliferative diabetic  retinopathy of right eye associated with type 2 diabetes mellitus ([place]) 09/09/2017   Genital herpes 05/07/2017   DVT (deep venous thrombosis) ([place]) 12/30/2016   History of DVT (deep vein thrombosis) 12/30/2016   Hx of CABG 2018   PVD (posterior vitreous detachment), right eye 01/09/2016   Diabetic peripheral angiopathy ([place]) 01/03/2016   Age-related nuclear cataract of both eyes 10/24/2015   Chronic nonintractable headache 10/02/2015   Dry eyes, bilateral 02/13/2015   Proptosis 12/18/2014   Iritis of right eye 12/03/2014   Anemia 08/30/2014   Depression 08/30/2014    Orientation RESPIRATION BLADDER Height & Weight     Self, Time, Situation, Place  Normal Continent Weight: 173 lb 1 oz (78.5 kg) Height:  5\' 3"  (160 cm)  BEHAVIORAL SYMPTOMS/MOOD NEUROLOGICAL BOWEL NUTRITION STATUS      Continent Diet (Low sodium)  AMBULATORY STATUS COMMUNICATION OF NEEDS Skin   Extensive Assist Verbally Normal                       Personal Care Assistance Level of Assistance  Bathing, Feeding, Dressing Bathing Assistance: Limited assistance Feeding assistance: Independent Dressing Assistance: Limited assistance     Functional Limitations Info  Sight, Speech, Hearing Sight Info: Impaired (Blind in left eye) Hearing Info: Adequate Speech Info: Adequate    SPECIAL CARE FACTORS FREQUENCY  PT (By licensed PT)     PT Frequency: Min 2X/week              Contractures Contractures Info: Not present    Additional Factors Info  Code Status, Allergies Code Status Info: Full Allergies Info: Hydralazine, Kiwi Extract, Lisinopril, Shellfish Allergy, Betadine (povidone Iodine), Metformin, Tape           Current Medications (02/13/2022):  This is the current hospital active medication list Current Facility-Administered Medications  Medication Dose Route Frequency Provider Last Rate Last Admin   acetaminophen (TYLENOL) tablet 650 mg  650 mg Oral Q6H PRN Athena Masse, MD   650 mg at  02/10/22 1550   Or   acetaminophen (TYLENOL) suppository 650 mg  650 mg Rectal Q6H PRN Athena Masse, MD       amLODipine (NORVASC) tablet 10 mg  10 mg Oral Daily Adhikari, Tamsen Meek, MD   10 mg at 02/13/22 0820   Ampicillin-Sulbactam (UNASYN) 3 g in sodium chloride 0.9 % 100 mL IVPB  3 g Intravenous Q24H Shelly Coss, MD 200 mL/hr at 02/12/22 2148 3 g at 02/12/22 2148   aspirin EC tablet 81 mg  81 mg Oral Daily Adhikari, Amrit, MD   81 mg at 02/13/22 0820   atorvastatin (LIPITOR) tablet 80 mg  80 mg Oral q1800 Shelly Coss, MD   80 mg at 02/12/22 1721   carvedilol (COREG) tablet 25 mg  25 mg Oral BID Shelly Coss, MD   25 mg at 02/12/22 2148   Chlorhexidine Gluconate Cloth 2 % PADS 6 each  6 each Topical Q0600 Colon Flattery, NP   6 each at 02/13/22 0505   furosemide (LASIX) tablet 80 mg  80 mg Oral Once per day on Sun Tue Thu Sat Shelly Coss, MD   80 mg at 02/13/22 0825   heparin injection 5,000 Units  5,000 Units Subcutaneous Q8H Athena Masse, MD  5,000 Units at 02/13/22 0503   HYDROcodone-acetaminophen (NORCO) 10-325 MG per tablet 1 tablet  1 tablet Oral Q6H PRN Shelly Coss, MD   1 tablet at 02/13/22 0502   insulin aspart (novoLOG) injection 0-15 Units  0-15 Units Subcutaneous TID WC Shelly Coss, MD   2 Units at 02/13/22 1215   isosorbide mononitrate (IMDUR) 24 hr tablet 30 mg  30 mg Oral Daily Adhikari, Amrit, MD   30 mg at 02/13/22 0820   LORazepam (ATIVAN) tablet 0.5 mg  0.5 mg Oral Once Foust, Katy L, NP       melatonin tablet 5 mg  5 mg Oral QHS PRN Sharion Settler, NP       mirtazapine (REMERON) tablet 30 mg  30 mg Oral QHS Adhikari, Amrit, MD   30 mg at 02/12/22 2149   morphine (PF) 2 MG/ML injection 2 mg  2 mg Intravenous Q2H PRN Athena Masse, MD   2 mg at 02/12/22 2201   naloxone [facility]) injection 0.4 mg  0.4 mg Intravenous PRN Foust, Katy L, NP       ondansetron (ZOFRAN) tablet 4 mg  4 mg Oral Q6H PRN Athena Masse, MD       Or   ondansetron  [facility]) injection 4 mg  4 mg Intravenous Q6H PRN Athena Masse, MD   4 mg at 02/10/22 7412   pantoprazole (PROTONIX) EC tablet 40 mg  40 mg Oral Daily Shelly Coss, MD   40 mg at 02/13/22 0820   polyethylene glycol (MIRALAX / GLYCOLAX) packet 17 g  17 g Oral Daily Adhikari, Tamsen Meek, MD       senna (SENOKOT) tablet 8.6 mg  1 tablet Oral BID Shelly Coss, MD       sucralfate (CARAFATE) tablet 1 g  1 g Oral BID AC Adhikari, Amrit, MD   1 g at 02/13/22 0820   topiramate (TOPAMAX) tablet 25 mg  25 mg Oral Daily Shelly Coss, MD   25 mg at 02/13/22 0825     Discharge Medications: Please see discharge summary for a list of discharge medications.  Relevant Imaging Results:  Relevant Lab Results:   Additional Information [id number]. Patient takes Insulin 15 units,  3 times daily with meals. Patient has recevied covid vaccine but no boosters  [employer], LCSWA

## 2022-02-13 NOTE — Progress Notes (Signed)
PROGRESS NOTE  Terry Arroyo  ZOX:[id number] DOB: 1961/06/26 DOA: 02/09/2022 PCP: Kirk Ruths, MD   Brief Narrative: Patient is a 61 year old female with history of ESRD on dialysis on Monday, Wednesday, Friday, systolic congestive heart failure, hypertension, diabetes type 2, depression, COPD who presented with pain on the left thigh.  On presentation she was hypertensive.  CT left femur did not show any acute abnormality.  Also noted to have lower extremity edema.  Presentation was consistent with cellulitis of the left thigh and was started on antibiotics.  MRI showed diffuse edema primarily involving the anterior compartment musculature of the left thigh,  2.9 x 1.9 cm focal collection within the vastus medialis which could be an intramuscular hematoma or abscess.  Orthopedics consulted ,IR consulted for possible drainage.  Ultrasound of the thigh did not show any focal fluid collection so procedure not done.  Currently on IV antibiotics.  Left thigh pain and edema improved with  IV antibiotics.  PT consulted ,recommended SNF on DC.  Assessment & Plan:  Principal Problem:   Acute pain of left thigh Active Problems:   Type 2 diabetes mellitus with hyperlipidemia (HCC)   Essential hypertension   HFrEF (heart failure with reduced ejection fraction) (HCC)   CAD (coronary artery disease)   ESRD on dialysis ([place])   Cellulitis of left thigh   Hx of CABG   Physical debility   Assessment and Plan: * Acute pain of left thigh- (present on admission) Seen by vascular with low suspicion for vascular etiology.  Negative for DVT on 2/14.  Venous Doppler on 2/21 showed grossly patent deep veins, short segment of nonocclusive thrombus/thrombophlebitis within the greater saphenous vein. MRI showed MRI showed diffuse edema primarily involving the anterior compartment musculature of the left thigh,  2.9 x 1.9 cm focal collection within the vastus medialis which could be an intramuscular hematoma  or abscess.  Orthopedics /IR consulted, ultrasound did not show any abscess or collection so procedure canceled. Continue broad spectrum abx.F/U cultures,NGTD.  Left thigh edema, tenderness has significantly improved but she was still having pain she underwent CT left thigh with contrast we did not show any collection/abscess but showed features of myositis/cellulitis.  Orthopedics recommending to continue antibiotics, no plan for surgical intervention.  Physical debility Patient seen by PT and recommended skilled nursing facility on discharge  Cellulitis of left thigh CT showed no acute bony injury.MRI showed possible abscess versus hematoma. [place] did not show any fluid collection.  CT showed features of myositis/cellulitis  ESRD on dialysis [facility]) Nephrology consult for continuation of dialysis  CAD (coronary artery disease)- (present on admission) Patient has history of CABG Continue aspirin, atorvastatin, carvedilol and Imdur  HFrEF (heart failure with reduced ejection fraction) ([place])- (present on admission) Currently euvolemic Continue furosemide, carvedilol, Imdur  Essential hypertension- (present on admission) Continue amlodipine and carvedilol  Type 2 diabetes mellitus with hyperlipidemia ([place])- (present on admission) Sliding scale insulin coverage for now              DVT prophylaxis:heparin injection 5,000 Units Start: 02/09/22 2300     Code Status: Full Code  Family Communication: None at the bedside  Patient status: Inpatient  Patient is from : Home  Anticipated discharge to: SNF  Estimated DC date: 1 to 2 days, waiting for orthopedics evaluation today.   Consultants: Orthopedics,IR  Procedures: None   Antimicrobials:  Anti-infectives (From admission, onward)    Start     Dose/Rate Route Frequency Ordered Stop  02/12/22 1200  vancomycin (VANCOREADY) IVPB 750 mg/150 mL  Status:  Discontinued        750 mg 150 mL/hr over 60 Minutes Intravenous  Every M-W-F (Hemodialysis) 02/11/22 0711 02/11/22 0930   02/11/22 2200  Ampicillin-Sulbactam (UNASYN) 3 g in sodium chloride 0.9 % 100 mL IVPB        3 g 200 mL/hr over 30 Minutes Intravenous Every 24 hours 02/11/22 0930     02/10/22 2200  cefTRIAXone (ROCEPHIN) 1 g in sodium chloride 0.9 % 100 mL IVPB  Status:  Discontinued        1 g 200 mL/hr over 30 Minutes Intravenous Every 24 hours 02/09/22 2246 02/10/22 1158   02/10/22 2100  vancomycin (VANCOREADY) IVPB 2000 mg/400 mL        2,000 mg 200 mL/hr over 120 Minutes Intravenous  Once 02/10/22 1650 02/10/22 2253   02/10/22 1400  ceFEPIme (MAXIPIME) 1 g in sodium chloride 0.9 % 100 mL IVPB  Status:  Discontinued        1 g 200 mL/hr over 30 Minutes Intravenous Every 24 hours 02/10/22 1237 02/11/22 0930   02/10/22 1245  vancomycin (VANCOREADY) IVPB 2000 mg/400 mL  Status:  Discontinued        2,000 mg 200 mL/hr over 120 Minutes Intravenous  Once 02/10/22 1237 02/10/22 1650   02/09/22 2200  cefTRIAXone (ROCEPHIN) 2 g in sodium chloride 0.9 % 100 mL IVPB        2 g 200 mL/hr over 30 Minutes Intravenous  Once 02/09/22 2155 02/09/22 2248       Subjective:  Patient seen and examined at the bedside this morning.  Hemodynamically stable today.  She looks comfortable.  She still complains of discomfort and pain in the left thigh but overall she looks significantly improved, there is less swelling and tenderness of the left thigh.  She is agreeable for rehab placement  objective: Vitals:   02/12/22 1354 02/12/22 2153 02/13/22 0510 02/13/22 0745  BP: 114/62 126/60 116/64 110/86  Pulse: 62 66 65 64  Resp: 18 17 17 18   Temp: 98.9 F (37.2 C) 98.6 F (37 C) 97.9 F (36.6 C) 97.8 F (36.6 C)  TempSrc:  Oral  Oral  SpO2: 95% 95% 91% 96%  Weight:      Height:        Intake/Output Summary (Last 24 hours) at 02/13/2022 1042 Last data filed at 02/13/2022 1019 Gross per 24 hour  Intake 460 ml  Output 1623 ml  Net -1163 ml   Filed Weights    02/10/22 1824 02/12/22 0900 02/12/22 1210  Weight: 79.9 kg 79.9 kg 78.5 kg    Examination:  General exam: Overall comfortable, not in distress,obese HEENT: PERRL Respiratory system:  no wheezes or crackles  Cardiovascular system: S1 & S2 heard, RRR.  Gastrointestinal system: Abdomen is nondistended, soft and nontender. Central nervous system: Alert and oriented Extremities: No edema, no clubbing ,no cyanosis,AV fistula on the right arm Skin: No rashes, no ulcers,no icterus     Data Reviewed: I have personally reviewed following labs and imaging studies  CBC: Recent Labs  Lab 02/09/22 1716 02/11/22 0403 02/12/22 0422  WBC 8.9 8.9 7.2  NEUTROABS  --  6.9 5.1  HGB 12.5 10.9* 11.3*  HCT 40.6 35.1* 36.0  MCV 91.2 89.8 89.3  PLT 279 240 580   Basic Metabolic Panel: Recent Labs  Lab 02/09/22 1716 02/11/22 0403 02/12/22 0422  NA 136 135 135  K 4.5 4.1 4.1  CL 94* 93* 94*  CO2 27 26 26   GLUCOSE 104* 132* 134*  BUN 33* 27* 42*  CREATININE 6.18* 5.47* 6.77*  CALCIUM 8.8* 8.1* 8.4*     Recent Results (from the past 240 hour(s))  MRSA Next Gen by PCR, Nasal     Status: None   Collection Time: 02/10/22  3:00 PM   Specimen: Nasal Mucosa; Nasal Swab  Result Value Ref Range Status   MRSA by PCR Next Gen NOT DETECTED NOT DETECTED Final    Comment: (NOTE) The GeneXpert MRSA Assay (FDA approved for NASAL specimens only), is one component of a comprehensive MRSA colonization surveillance program. It is not intended to diagnose MRSA infection nor to guide or monitor treatment for MRSA infections. Test performance is not FDA approved in patients less than 79 years old. Performed at [facility], [address] [place], [place] [address]   Resp Panel by RT-PCR (Flu A&B, Covid) Nasal Mucosa     Status: None   Collection Time: 02/10/22  3:00 PM   Specimen: Nasal Mucosa; Nasopharyngeal(NP) swabs in vial transport medium  Result Value Ref Range Status   SARS  Coronavirus 2 by RT PCR NEGATIVE NEGATIVE Final    Comment: (NOTE) SARS-CoV-2 target nucleic acids are NOT DETECTED.  The SARS-CoV-2 RNA is generally detectable in upper respiratory specimens during the acute phase of infection. The lowest concentration of SARS-CoV-2 viral copies this assay can detect is 138 copies/mL. A negative result does not preclude SARS-Cov-2 infection and should not be used as the sole basis for treatment or other patient management decisions. A negative result may occur with  improper specimen collection/handling, submission of specimen other than nasopharyngeal swab, presence of viral mutation(s) within the areas targeted by this assay, and inadequate number of viral copies(<138 copies/mL). A negative result must be combined with clinical observations, patient history, and epidemiological information. The expected result is Negative.  Fact Sheet for Patients:  EntrepreneurPulse.com.au  Fact Sheet for Healthcare Providers:  IncredibleEmployment.be  This test is no t yet approved or cleared by the [place] FDA and  has been authorized for detection and/or diagnosis of SARS-CoV-2 by FDA under an Emergency Use Authorization (EUA). This EUA will remain  in effect (meaning this test can be used) for the duration of the COVID-19 declaration under Section 564(b)(1) of the Act, 21 U.S.C.section 360bbb-3(b)(1), unless the authorization is terminated  or revoked sooner.       Influenza A by PCR NEGATIVE NEGATIVE Final   Influenza B by PCR NEGATIVE NEGATIVE Final    Comment: (NOTE) The Xpert Xpress SARS-CoV-2/FLU/RSV plus assay is intended as an aid in the diagnosis of influenza from Nasopharyngeal swab specimens and should not be used as a sole basis for treatment. Nasal washings and aspirates are unacceptable for Xpert Xpress SARS-CoV-2/FLU/RSV testing.  Fact Sheet for  Patients: EntrepreneurPulse.com.au  Fact Sheet for Healthcare Providers: IncredibleEmployment.be  This test is not yet approved or cleared by the [place] FDA and has been authorized for detection and/or diagnosis of SARS-CoV-2 by FDA under an Emergency Use Authorization (EUA). This EUA will remain in effect (meaning this test can be used) for the duration of the COVID-19 declaration under Section 564(b)(1) of the Act, 21 U.S.C. section 360bbb-3(b)(1), unless the authorization is terminated or revoked.  Performed at [facility], [address] [place], [place] [address]   Culture, blood (routine x 2)     Status: None (Preliminary result)   Collection Time: 02/10/22  8:40 PM   Specimen: BLOOD  Result Value Ref Range Status   Specimen Description BLOOD LEFT HAND  Final   Special Requests   Final    BOTTLES DRAWN AEROBIC AND ANAEROBIC Blood Culture adequate volume   Culture   Final    NO GROWTH 3 DAYS Performed at [facility], [address], [place], [place] [address]    Report Status PENDING  Incomplete  Culture, blood (routine x 2)     Status: None (Preliminary result)   Collection Time: 02/10/22 10:05 PM   Specimen: BLOOD  Result Value Ref Range Status   Specimen Description BLOOD LEFT WRIST  Final   Special Requests   Final    BOTTLES DRAWN AEROBIC AND ANAEROBIC Blood Culture adequate volume   Culture   Final    NO GROWTH 3 DAYS Performed at [facility], [address], [place], [place] [address]    Report Status PENDING  Incomplete     Radiology Studies: CT FEMUR LEFT W CONTRAST  Result Date: 02/12/2022 CLINICAL DATA:  Soft tissue infection suspected. EXAM: CT OF THE LOWER RIGHT EXTREMITY WITH CONTRAST TECHNIQUE: Multidetector CT imaging of the lower right extremity was performed according to the standard protocol following intravenous contrast administration. RADIATION DOSE REDUCTION: This exam  was performed according to the departmental dose-optimization program which includes automated exposure control, adjustment of the mA and/or kV according to patient size and/or use of iterative reconstruction technique. CONTRAST:  170mL OMNIPAQUE IOHEXOL 300 MG/ML  SOLN COMPARISON:  MRI 02/10/2022 FINDINGS: Bones/Joint/Cartilage Grossly unremarkable Ligaments Suboptimally assessed by CT. Muscles and Tendons Edema again noted surrounding the anterior compartment musculature as seen on recent MRI. The previously seen focal fluid collection within the vastus medialis not appreciated by CT. Soft tissues Subcutaneous edema throughout the upper thighs circumferentially and medially in the mid to lower thigh. IMPRESSION: Edema primarily involving the anterior compartment musculature as seen on recent MRI most compatible with myositis. The previously described small vastus medialis fluid collection not appreciated by CT. This could be further evaluated and possibly better seen with MRI. Overlying subcutaneous edema could reflect cellulitis. Electronically Signed   By: Rolm Baptise M.D.   On: 02/12/2022 20:38    Scheduled Meds:  amLODipine  10 mg Oral Daily   aspirin EC  81 mg Oral Daily   atorvastatin  80 mg Oral q1800   carvedilol  25 mg Oral BID   Chlorhexidine Gluconate Cloth  6 each Topical Q0600   furosemide  80 mg Oral Once per day on Sun Tue Thu Sat   heparin  5,000 Units Subcutaneous Q8H   insulin aspart  0-15 Units Subcutaneous TID WC   isosorbide mononitrate  30 mg Oral Daily   LORazepam  0.5 mg Oral Once   mirtazapine  30 mg Oral QHS   pantoprazole  40 mg Oral Daily   sucralfate  1 g Oral BID AC   topiramate  25 mg Oral Daily   Continuous Infusions:  ampicillin-sulbactam (UNASYN) IV 3 g (02/12/22 2148)     LOS: 3 days   Shelly Coss, MD [facility] P2/25/2023, 10:42 AM

## 2022-02-13 NOTE — Progress Notes (Signed)
Subjective:  Asked by Dr. Tawanna Solo to see the patient today.   Dr. Harlow Mares from my office was the original consultant.  Dr. Earmon Phoenix note states the patient is clinically improving\ on IV antibiotics.  Original consult was for left thigh pain for several weeks.  Patient has a history of end-stage renal disease on dialysis Monday Wednesday Friday, systolic congestive heart failure, hypertension, type 2 diabetes, depression and COPD.  This evening the patient is seen in her hospital room.  She is sitting on the side of the bed.  She complains of moderate anterior left thigh pain but agrees that she has been improving over the past few days.  She complains of mild left-sided low back and posterior lateral hip pain.  Objective:   VITALS:   Vitals:   02/12/22 2153 02/13/22 0510 02/13/22 0745 02/13/22 1526  BP: 126/60 116/64 110/86 118/62  Pulse: 66 65 64 64  Resp: 17 17 18 18   Temp: 98.6 F (37 C) 97.9 F (36.6 C) 97.8 F (36.6 C) 97.7 F (36.5 C)  TempSrc: Oral  Oral Oral  SpO2: 95% 91% 96% 96%  Weight:      Height:        PHYSICAL EXAM: Left lower extremity: Patient has mild to moderate tenderness to palpation over the anterior proximal thigh.  Her compartments are soft and compressible.  There is no significant swelling seen.  Patient had no significant pain with logrolling of the left hip.  She had mild tenderness to palpation over the left paraspinal muscles of the lumbar spine extending into the proximal gluteus maximus and abductor muscles.  There is no erythema, ecchymosis or swelling over these areas.  Distally the patient has palpable pedal pulses, intact sensation light touch and intact motor function.  Patient is able to fully extend her knee but has difficulty fully extending and actively likely due to quadriceps weakness.   LABS  Results for orders placed or performed during the hospital encounter of 02/09/22 (from the past 24 hour(s))  Glucose, capillary     Status:  Abnormal   Collection Time: 02/12/22  9:19 PM  Result Value Ref Range   Glucose-Capillary 119 (H) 70 - 99 mg/dL  Glucose, capillary     Status: None   Collection Time: 02/13/22  5:10 AM  Result Value Ref Range   Glucose-Capillary 90 70 - 99 mg/dL  Glucose, capillary     Status: Abnormal   Collection Time: 02/13/22  7:46 AM  Result Value Ref Range   Glucose-Capillary 108 (H) 70 - 99 mg/dL   Comment 1 Notify RN    Comment 2 Document in Chart   Glucose, capillary     Status: Abnormal   Collection Time: 02/13/22 11:58 AM  Result Value Ref Range   Glucose-Capillary 127 (H) 70 - 99 mg/dL   Comment 1 Notify RN    Comment 2 Document in Chart   Glucose, capillary     Status: None   Collection Time: 02/13/22  5:01 PM  Result Value Ref Range   Glucose-Capillary 97 70 - 99 mg/dL   Comment 1 Notify RN    Comment 2 Document in Chart     CT FEMUR LEFT W CONTRAST  Result Date: 02/12/2022 CLINICAL DATA:  Soft tissue infection suspected. EXAM: CT OF THE LOWER RIGHT EXTREMITY WITH CONTRAST TECHNIQUE: Multidetector CT imaging of the lower right extremity was performed according to the standard protocol following intravenous contrast administration. RADIATION DOSE REDUCTION: This exam was performed according to  the departmental dose-optimization program which includes automated exposure control, adjustment of the mA and/or kV according to patient size and/or use of iterative reconstruction technique. CONTRAST:  133mL OMNIPAQUE IOHEXOL 300 MG/ML  SOLN COMPARISON:  MRI 02/10/2022 FINDINGS: Bones/Joint/Cartilage Grossly unremarkable Ligaments Suboptimally assessed by CT. Muscles and Tendons Edema again noted surrounding the anterior compartment musculature as seen on recent MRI. The previously seen focal fluid collection within the vastus medialis not appreciated by CT. Soft tissues Subcutaneous edema throughout the upper thighs circumferentially and medially in the mid to lower thigh. IMPRESSION: Edema  primarily involving the anterior compartment musculature as seen on recent MRI most compatible with myositis. The previously described small vastus medialis fluid collection not appreciated by CT. This could be further evaluated and possibly better seen with MRI. Overlying subcutaneous edema could reflect cellulitis. Electronically Signed   By: Rolm Baptise M.D.   On: 02/12/2022 20:38    Assessment/Plan:     Principal Problem:   Acute pain of left thigh Active Problems:   Type 2 diabetes mellitus with hyperlipidemia (HCC)   Essential hypertension   HFrEF (heart failure with reduced ejection fraction) (HCC)   CAD (coronary artery disease)   ESRD on dialysis ([place])   Cellulitis of left thigh   Hx of CABG   Physical debility  Patient had a CT scan yesterday showing edema in the anterior compartment musculature consistent with her previous MRI and most compatible with myositis per the radiologist report.  The previously described small vastus medialis fluid collection is not appreciated on CT.  Patient has overlying subcutaneous edema which could reflect cellulitis per the radiologist report.  Recommend continuing with IV antibiotics.  No surgical intervention is indicated at this time as there is no drainable collection.  Recommend continuing physical therapy as the patient can tolerate.    Thornton Park , MD 02/13/2022, 7:46 PM

## 2022-02-13 NOTE — Evaluation (Signed)
Occupational Therapy Evaluation Patient Details Name: Terry Arroyo MRN: [id number] DOB: Jan 29, 1961 Today's Date: 02/13/2022   History of Present Illness Pt is a 61 y/o F admitted on 02/09/22 with after presenting with c/c of L side thigh pain & pt was hypertensive on presentation. CT of L femur was negative for acute abnormality. Pt is being treated for L thigh cellulitis & was started on antibiotics. MRI showed diffuse edema primarily involving the anterior compartment musculature of the left thigh, 2.9 x 1.9 cm focal collection within the vastus medialis which could be an intramuscular hematoma or abscess. Orthopedics consulted & IR consulted for possible drainage but thigh [place] did not show any focal fluid so procedure not done. PMH: ESRD on HD MWF, systolic CHF, HTN, DM2, depression, COPD, blind L eye, HFrEF, CABG x 4 (2018), stroke affecting L side (2014)   Clinical Impression   Pt seen for OT evaluation this date in setting of acute hospitalization d/t L LE cellulitis. She presents this date with reduced pain and improved tolerance. She is able to CTS with RW with SUPV and demos improved standing balance. She does require MIN A for some seated LB ADLs such as threading socks d/t some lingering L LE pain, but demos adequate ROM to don pants/underwear. Pt left seated EOB with G sitting balance with all needs met and in reach. Will continue to follow acutely. Recommend f/u with HHOT services to ensure safety with standing ADLs, IADLs/dynamic balance in the natural environment.      Recommendations for follow up therapy are one component of a multi-disciplinary discharge planning process, led by the attending physician.  Recommendations may be updated based on patient status, additional functional criteria and insurance authorization.   Follow Up Recommendations  Home health OT    Assistance Recommended at Discharge Intermittent Supervision/Assistance  Patient can return home with the following A  little help with walking and/or transfers;Assistance with cooking/housework;Assist for transportation;Help with stairs or ramp for entrance    Functional Status Assessment  Patient has had a recent decline in their functional status and demonstrates the ability to make significant improvements in function in a reasonable and predictable amount of time.  Equipment Recommendations  Tub/shower seat;Other (comment);BSC/3in1 (2ww)    Recommendations for Other Services       Precautions / Restrictions Precautions Precautions: Fall Restrictions Weight Bearing Restrictions: No      Mobility Bed Mobility Overal bed mobility: Modified Independent             General bed mobility comments: increased time to bring L LE around to dangle EOB    Transfers Overall transfer level: Needs assistance Equipment used: Rolling walker (2 wheels) Transfers: Sit to/from Stand            Lateral/Scoot Transfers: Supervision General transfer comment: increased time, cues for safe use of RW, reports improved over yesterday d/t decreased L LE pain      Balance Overall balance assessment: Needs assistance Sitting-balance support: Feet supported Sitting balance-Leahy Scale: Good       Standing balance-Leahy Scale: Fair                             ADL either performed or assessed with clinical judgement   ADL  General ADL Comments: INDEP for UB ADLs, MIN A for LB ADLs such as threading socks to L LE d/t pain. SUPV with RW for cues to use correctly/hand placement.     Vision   Additional Comments: somewhat misaligned which is baseline. Able to track appropriately for tasks assessed     Perception     Praxis      Pertinent Vitals/Pain Pain Assessment Pain Assessment: Faces Faces Pain Scale: Hurts little more Pain Location: L thigh Pain Descriptors / Indicators: Discomfort, Crying Pain Intervention(s): Limited  activity within patient's tolerance, Monitored during session, Repositioned, Heat applied     Hand Dominance Left   Extremity/Trunk Assessment Upper Extremity Assessment Upper Extremity Assessment: Generalized weakness   Lower Extremity Assessment Lower Extremity Assessment: Generalized weakness;LLE deficits/detail LLE Deficits / Details: decreased tolerance for hip abd/add and hip flex/ext as it pertains to LB ADLs.       Communication Communication Communication:  (somewhat garbled occasionally, but able to dsicern and mostly clear.)   Cognition Arousal/Alertness: Awake/alert Behavior During Therapy: Flat affect Overall Cognitive Status: Within Functional Limits for tasks assessed                                 General Comments: A&O, pleasant, verbose requiring gentle redirection to attend to task     General Comments       Exercises Other Exercises Other Exercises: OT ed re: role, safety considerations/fall prevention, use of RW.   Shoulder Instructions      Home Living Family/patient expects to be discharged to:: Private residence Living Arrangements: Alone Available Help at Discharge: Family;Available PRN/intermittently Type of Home: Apartment (35m level, elevator access, "senior living") Home Access: Level entry;Elevator     Home Layout: One level     Bathroom Shower/Tub: TTeacher, early years/pre Standard     Home Equipment: Rollator (4 wheels);Wheelchair - manual   Additional Comments: Pt owned a rollator but is broken per patient      Prior Functioning/Environment Prior Level of Function : Independent/Modified Independent             Mobility Comments: Pt reports she lives alone without family/friends nearby or available to assist. Pt reports she ambulates with rollator & performs cooking from w/c or rollator level while sitting.          OT Problem List: Decreased range of motion;Decreased activity  tolerance;Pain;Increased edema      OT Treatment/Interventions: Self-care/ADL training;Therapeutic exercise;Therapeutic activities;DME and/or AE instruction    OT Goals(Current goals can be found in the care plan section) Acute Rehab OT Goals Patient Stated Goal: to go home OT Goal Formulation: With patient Time For Goal Achievement: 02/27/22 Potential to Achieve Goals: Good ADL Goals Pt Will Perform Lower Body Dressing: with modified independence Pt Will Transfer to Toilet: with modified independence;ambulating Pt Will Perform Toileting - Clothing Manipulation and hygiene: with modified independence  OT Frequency: Min 2X/week    Co-evaluation              AM-PAC OT "6 Clicks" Daily Activity     Outcome Measure Help from another person eating meals?: None Help from another person taking care of personal grooming?: None Help from another person toileting, which includes using toliet, bedpan, or urinal?: A Little Help from another person bathing (including washing, rinsing, drying)?: A Little Help from another person to put on and taking off regular upper body clothing?: None Help  from another person to put on and taking off regular lower body clothing?: A Little 6 Click Score: 21   End of Session Equipment Utilized During Treatment: Rolling walker (2 wheels) Nurse Communication: Mobility status  Activity Tolerance: Patient tolerated treatment well Patient left: in bed;with call bell/phone within reach  OT Visit Diagnosis: Muscle weakness (generalized) (M62.81);Pain Pain - Right/Left: Left Pain - part of body: Leg                Time: 6384-5364 OT Time Calculation (min): 11 min Charges:  OT General Charges $OT Visit: 1 Visit OT Evaluation $OT Eval Low Complexity: [place], MS, OTR/L ascom [phone] 02/13/22, 4:45 PM

## 2022-02-13 NOTE — Progress Notes (Signed)
[facility]  ROUNDING NOTE   Subjective:   Terry Arroyo is a 61 year old female with past medical history including type 2 diabetes with polyneuropathy, depression, angioedema, heart failure, hypertension, and end-stage renal disease on dialysis.  Patient presents to the emergency department with extreme left thigh pain.  She will be admitted for ESRD (end stage renal disease) ([place]) [N18.6] Left leg cellulitis [L03.116] Abscess of left thigh [L02.416] Acute pain of left thigh [M79.652]  Patient is known to our practice and receives outpatient dialysis treatments at [facility] on a MWF schedule, supervised by Dr. Juleen China.  Last treatment received on Monday.      Objective:  Vital signs in last 24 hours:  Temp:  [97.8 F (36.6 C)-98.9 F (37.2 C)] 97.8 F (36.6 C) (02/25 0745) Pulse Rate:  [59-77] 64 (02/25 0745) Resp:  [10-22] 18 (02/25 0745) BP: (96-144)/(54-124) 110/86 (02/25 0745) SpO2:  [89 %-99 %] 96 % (02/25 0745) Weight:  [78.5 kg-79.9 kg] 78.5 kg (02/24 1210)  Weight change:  Filed Weights   02/10/22 1824 02/12/22 0900 02/12/22 1210  Weight: 79.9 kg 79.9 kg 78.5 kg    Intake/Output: I/O last 3 completed shifts: In: 440 [P.O.:340; IV Piggyback:100] Out: 1623 [Other:1623]   Intake/Output this shift:  No intake/output data recorded.  Physical Exam: General: NAD  Head: Normocephalic, atraumatic. Moist oral mucosal membranes  Eyes: Anicteric  Lungs:  Clear to auscultation, normal effort, room air  Heart: Regular rate and rhythm  Abdomen:  Soft, nontender, nondistended  Extremities: Left thigh edema  Neurologic: Alert, moving all four extremities  Skin: Left medial thigh erythema  Access: Right aVF    Basic Metabolic Panel: Recent Labs  Lab 02/09/22 1716 02/11/22 0403 02/12/22 0422  NA 136 135 135  K 4.5 4.1 4.1  CL 94* 93* 94*  CO2 27 26 26   GLUCOSE 104* 132* 134*  BUN 33* 27* 42*  CREATININE 6.18* 5.47* 6.77*  CALCIUM 8.8*  8.1* 8.4*    Liver Function Tests: Recent Labs  Lab 02/09/22 1716  AST 20  ALT 12  ALKPHOS 78  BILITOT 1.7*  PROT 8.1  ALBUMIN 3.7   Recent Labs  Lab 02/09/22 1716  LIPASE 25   No results for input(s): AMMONIA in the last 168 hours.  CBC: Recent Labs  Lab 02/09/22 1716 02/11/22 0403 02/12/22 0422  WBC 8.9 8.9 7.2  NEUTROABS  --  6.9 5.1  HGB 12.5 10.9* 11.3*  HCT 40.6 35.1* 36.0  MCV 91.2 89.8 89.3  PLT 279 240 245    Cardiac Enzymes: No results for input(s): CKTOTAL, CKMB, CKMBINDEX, TROPONINI in the last 168 hours.  BNP: Invalid input(s): POCBNP  CBG: Recent Labs  Lab 02/12/22 0801 02/12/22 1719 02/12/22 2119 02/13/22 0510 02/13/22 0746  GLUCAP 121* 167* 119* 33 108*    Microbiology: Results for orders placed or performed during the hospital encounter of 02/09/22  MRSA Next Gen by PCR, Nasal     Status: None   Collection Time: 02/10/22  3:00 PM   Specimen: Nasal Mucosa; Nasal Swab  Result Value Ref Range Status   MRSA by PCR Next Gen NOT DETECTED NOT DETECTED Final    Comment: (NOTE) The GeneXpert MRSA Assay (FDA approved for NASAL specimens only), is one component of a comprehensive MRSA colonization surveillance program. It is not intended to diagnose MRSA infection nor to guide or monitor treatment for MRSA infections. Test performance is not FDA approved in patients less than 79 years old.  Performed at [facility], [address] [place], [place] [address]   Resp Panel by RT-PCR (Flu A&B, Covid) Nasal Mucosa     Status: None   Collection Time: 02/10/22  3:00 PM   Specimen: Nasal Mucosa; Nasopharyngeal(NP) swabs in vial transport medium  Result Value Ref Range Status   SARS Coronavirus 2 by RT PCR NEGATIVE NEGATIVE Final    Comment: (NOTE) SARS-CoV-2 target nucleic acids are NOT DETECTED.  The SARS-CoV-2 RNA is generally detectable in upper respiratory specimens during the acute phase of infection. The lowest concentration  of SARS-CoV-2 viral copies this assay can detect is 138 copies/mL. A negative result does not preclude SARS-Cov-2 infection and should not be used as the sole basis for treatment or other patient management decisions. A negative result may occur with  improper specimen collection/handling, submission of specimen other than nasopharyngeal swab, presence of viral mutation(s) within the areas targeted by this assay, and inadequate number of viral copies(<138 copies/mL). A negative result must be combined with clinical observations, patient history, and epidemiological information. The expected result is Negative.  Fact Sheet for Patients:  EntrepreneurPulse.com.au  Fact Sheet for Healthcare Providers:  IncredibleEmployment.be  This test is no t yet approved or cleared by the [place] FDA and  has been authorized for detection and/or diagnosis of SARS-CoV-2 by FDA under an Emergency Use Authorization (EUA). This EUA will remain  in effect (meaning this test can be used) for the duration of the COVID-19 declaration under Section 564(b)(1) of the Act, 21 U.S.C.section 360bbb-3(b)(1), unless the authorization is terminated  or revoked sooner.       Influenza A by PCR NEGATIVE NEGATIVE Final   Influenza B by PCR NEGATIVE NEGATIVE Final    Comment: (NOTE) The Xpert Xpress SARS-CoV-2/FLU/RSV plus assay is intended as an aid in the diagnosis of influenza from Nasopharyngeal swab specimens and should not be used as a sole basis for treatment. Nasal washings and aspirates are unacceptable for Xpert Xpress SARS-CoV-2/FLU/RSV testing.  Fact Sheet for Patients: EntrepreneurPulse.com.au  Fact Sheet for Healthcare Providers: IncredibleEmployment.be  This test is not yet approved or cleared by the [place] FDA and has been authorized for detection and/or diagnosis of SARS-CoV-2 by FDA under an Emergency Use  Authorization (EUA). This EUA will remain in effect (meaning this test can be used) for the duration of the COVID-19 declaration under Section 564(b)(1) of the Act, 21 U.S.C. section 360bbb-3(b)(1), unless the authorization is terminated or revoked.  Performed at [facility], [address] [place], [place] [address]   Culture, blood (routine x 2)     Status: None (Preliminary result)   Collection Time: 02/10/22  8:40 PM   Specimen: BLOOD  Result Value Ref Range Status   Specimen Description BLOOD LEFT HAND  Final   Special Requests   Final    BOTTLES DRAWN AEROBIC AND ANAEROBIC Blood Culture adequate volume   Culture   Final    NO GROWTH 2 DAYS Performed at [facility], [address] [place], [place] [address]    Report Status PENDING  Incomplete  Culture, blood (routine x 2)     Status: None (Preliminary result)   Collection Time: 02/10/22 10:05 PM   Specimen: BLOOD  Result Value Ref Range Status   Specimen Description BLOOD LEFT WRIST  Final   Special Requests   Final    BOTTLES DRAWN AEROBIC AND ANAEROBIC Blood Culture adequate volume   Culture   Final  NO GROWTH 2 DAYS Performed at [facility], [address] [place],  [address]    Report Status PENDING  Incomplete    Coagulation Studies: No results for input(s): LABPROT, INR in the last 72 hours.  Urinalysis: No results for input(s): COLORURINE, LABSPEC, PHURINE, GLUCOSEU, HGBUR, BILIRUBINUR, KETONESUR, PROTEINUR, UROBILINOGEN, NITRITE, LEUKOCYTESUR in the last 72 hours.  Invalid input(s): APPERANCEUR    Imaging: CT FEMUR LEFT W CONTRAST  Result Date: 02/12/2022 CLINICAL DATA:  Soft tissue infection suspected. EXAM: CT OF THE LOWER RIGHT EXTREMITY WITH CONTRAST TECHNIQUE: Multidetector CT imaging of the lower right extremity was performed according to the standard protocol following intravenous contrast administration. RADIATION DOSE REDUCTION: This exam was performed  according to the departmental dose-optimization program which includes automated exposure control, adjustment of the mA and/or kV according to patient size and/or use of iterative reconstruction technique. CONTRAST:  152mL OMNIPAQUE IOHEXOL 300 MG/ML  SOLN COMPARISON:  MRI 02/10/2022 FINDINGS: Bones/Joint/Cartilage Grossly unremarkable Ligaments Suboptimally assessed by CT. Muscles and Tendons Edema again noted surrounding the anterior compartment musculature as seen on recent MRI. The previously seen focal fluid collection within the vastus medialis not appreciated by CT. Soft tissues Subcutaneous edema throughout the upper thighs circumferentially and medially in the mid to lower thigh. IMPRESSION: Edema primarily involving the anterior compartment musculature as seen on recent MRI most compatible with myositis. The previously described small vastus medialis fluid collection not appreciated by CT. This could be further evaluated and possibly better seen with MRI. Overlying subcutaneous edema could reflect cellulitis. Electronically Signed   By: Rolm Baptise M.D.   On: 02/12/2022 20:38     Medications:    ampicillin-sulbactam (UNASYN) IV 3 g (02/12/22 2148)    amLODipine  10 mg Oral Daily   aspirin EC  81 mg Oral Daily   atorvastatin  80 mg Oral q1800   carvedilol  25 mg Oral BID   Chlorhexidine Gluconate Cloth  6 each Topical Q0600   furosemide  80 mg Oral Once per day on Sun Tue Thu Sat   heparin  5,000 Units Subcutaneous Q8H   insulin aspart  0-15 Units Subcutaneous TID WC   isosorbide mononitrate  30 mg Oral Daily   LORazepam  0.5 mg Oral Once   mirtazapine  30 mg Oral QHS   pantoprazole  40 mg Oral Daily   sucralfate  1 g Oral BID AC   topiramate  25 mg Oral Daily   acetaminophen **OR** acetaminophen, HYDROcodone-acetaminophen, melatonin, morphine injection, naLOXone (NARCAN)  injection, ondansetron **OR** ondansetron (ZOFRAN) IV  Assessment/ Plan:  Ms. ANJOLIE MAJER is a 61 y.o.   female past medical history including type 2 diabetes with polyneuropathy, depression, angioedema, heart failure, hypertension, and end-stage renal disease on dialysis.  Patient presents to the emergency department with extreme left thigh pain.  She will be admitted for ESRD (end stage renal disease) ([place]) [N18.6] Left leg cellulitis [L03.116] Abscess of left thigh [L02.416] Acute pain of left thigh [M79.652]  [facility]/MWF/right aVF/76kg  End-stage renal disease on hemodialysis.   Patient was last dialyzed yesterday No need for renal placement therapy today Patient to have next treatment scheduled for Monday.  2. Anemia of chronic kidney disease Normocytic Lab Results  Component Value Date   HGB 11.3 (L) 02/12/2022  Mircera ordered outpatient Hemoglobin at goal.  No need for ESA's at this time  3. Secondary Hyperparathyroidism:  Lab Results  Component Value Date   CALCIUM 8.4 (L) 02/12/2022  CAION 1.09 (L) 05/13/2021   PHOS 3.9 10/29/2021    We will continue to monitor bone minerals.  4.  Left leg cellulitis withocal collection within the vastus medialis muscle concerning for hematoma or abscess.  Recommend consulting orthopedics for possible aspiration.  Continue pain management and prescribed empiric antibiotic therapy. Orthopedics/IR consulted however no visible fluid seen on ultrasound.  Improved discomfort, continue antibiotics  5.Diastolic CHF Patient is well compensated  6.  Hypertension Patient blood pressure is at goal    LOS: 3 Kashlynn Kundert s [facility] 2/25/20238:01 AM

## 2022-02-14 LAB — CBC WITH DIFFERENTIAL/PLATELET
Abs Immature Granulocytes: 0.04 10*3/uL (ref 0.00–0.07)
Basophils Absolute: 0 10*3/uL (ref 0.0–0.1)
Basophils Relative: 0 %
Eosinophils Absolute: 0.3 10*3/uL (ref 0.0–0.5)
Eosinophils Relative: 5 %
HCT: 31.8 % — ABNORMAL LOW (ref 36.0–46.0)
Hemoglobin: 10 g/dL — ABNORMAL LOW (ref 12.0–15.0)
Immature Granulocytes: 1 %
Lymphocytes Relative: 14 %
Lymphs Abs: 0.9 10*3/uL (ref 0.7–4.0)
MCH: 28.1 pg (ref 26.0–34.0)
MCHC: 31.4 g/dL (ref 30.0–36.0)
MCV: 89.3 fL (ref 80.0–100.0)
Monocytes Absolute: 0.8 10*3/uL (ref 0.1–1.0)
Monocytes Relative: 12 %
Neutro Abs: 4.6 10*3/uL (ref 1.7–7.7)
Neutrophils Relative %: 68 %
Platelets: 217 10*3/uL (ref 150–400)
RBC: 3.56 MIL/uL — ABNORMAL LOW (ref 3.87–5.11)
RDW: 14.8 % (ref 11.5–15.5)
WBC: 6.7 10*3/uL (ref 4.0–10.5)
nRBC: 0 % (ref 0.0–0.2)

## 2022-02-14 LAB — BASIC METABOLIC PANEL
Anion gap: 17 — ABNORMAL HIGH (ref 5–15)
BUN: 36 mg/dL — ABNORMAL HIGH (ref 6–20)
CO2: 23 mmol/L (ref 22–32)
Calcium: 8.4 mg/dL — ABNORMAL LOW (ref 8.9–10.3)
Chloride: 93 mmol/L — ABNORMAL LOW (ref 98–111)
Creatinine, Ser: 6.66 mg/dL — ABNORMAL HIGH (ref 0.44–1.00)
GFR, Estimated: 7 mL/min — ABNORMAL LOW (ref >60)
Glucose, Bld: 82 mg/dL (ref 70–99)
Potassium: 4.2 mmol/L (ref 3.5–5.1)
Sodium: 133 mmol/L — ABNORMAL LOW (ref 135–145)

## 2022-02-14 LAB — GLUCOSE, CAPILLARY
Glucose-Capillary: 85 mg/dL (ref 70–99)
Glucose-Capillary: 116 mg/dL — ABNORMAL HIGH (ref 70–99)
Glucose-Capillary: 104 mg/dL — ABNORMAL HIGH (ref 70–99)
Glucose-Capillary: 106 mg/dL — ABNORMAL HIGH (ref 70–99)

## 2022-02-14 NOTE — Plan of Care (Signed)
°  Problem: Clinical Measurements: Goal: Ability to avoid or minimize complications of infection will improve Outcome: Progressing   Problem: Skin Integrity: Goal: Skin integrity will improve Outcome: Progressing   Problem: Education: Goal: Knowledge of General Education information will improve Description: Including pain rating scale, medication(s)/side effects and non-pharmacologic comfort measures Outcome: Progressing   Problem: Health Behavior/Discharge Planning: Goal: Ability to manage health-related needs will improve Outcome: Progressing   Problem: Clinical Measurements: Goal: Ability to maintain clinical measurements within normal limits will improve Outcome: Progressing Goal: Will remain free from infection Outcome: Progressing Goal: Diagnostic test results will improve Outcome: Progressing Goal: Respiratory complications will improve Outcome: Progressing Goal: Cardiovascular complication will be avoided Outcome: Progressing   Problem: Activity: Goal: Risk for activity intolerance will decrease Outcome: Progressing   Problem: Nutrition: Goal: Adequate nutrition will be maintained Outcome: Progressing   Problem: Pain Managment: Goal: General experience of comfort will improve Outcome: Not Progressing   Problem: Elimination: Goal: Will not experience complications related to bowel motility Outcome: Progressing   Problem: Safety: Goal: Ability to remain free from injury will improve Outcome: Progressing   Problem: Skin Integrity: Goal: Risk for impaired skin integrity will decrease Outcome: Progressing

## 2022-02-14 NOTE — Progress Notes (Signed)
[facility]  ROUNDING NOTE   Subjective:   Terry Arroyo is a 61 year old female with past medical history including type 2 diabetes with polyneuropathy, depression, angioedema, heart failure, hypertension, and end-stage renal disease on dialysis.  Patient presents to the emergency department with extreme left thigh pain.  She will be admitted for ESRD (end stage renal disease) ([place]) [N18.6] Left leg cellulitis [L03.116] Abscess of left thigh [L02.416] Acute pain of left thigh [M79.652]  Patient is known to our practice and receives outpatient dialysis treatments at [facility] on a MWF schedule, supervised by Dr. Juleen China.  .  Patient resting comfortably on the bed Patient offers no new specific physical complaints     Objective:  Vital signs in last 24 hours:  Temp:  [97.6 F (36.4 C)-98.6 F (37 C)] 98.6 F (37 C) (02/26 0743) Pulse Rate:  [61-69] 61 (02/26 0743) Resp:  [18-20] 18 (02/26 0743) BP: (118-145)/(62-67) 127/62 (02/26 0743) SpO2:  [90 %-100 %] 99 % (02/26 0743)  Weight change:  Filed Weights   02/10/22 1824 02/12/22 0900 02/12/22 1210  Weight: 79.9 kg 79.9 kg 78.5 kg    Intake/Output: I/O last 3 completed shifts: In: 880 [P.O.:880] Out: 0    Intake/Output this shift:  No intake/output data recorded.  Physical Exam: General: NAD  Head: Normocephalic, atraumatic. Moist oral mucosal membranes  Eyes: Anicteric  Lungs:  Clear to auscultation, normal effort, room air  Heart: Regular rate and rhythm  Abdomen:  Soft, nontender, nondistended  Extremities: Left thigh edema  Neurologic: Alert, moving all four extremities  Skin: Left medial thigh erythema  Access: Right aVF    Basic Metabolic Panel: Recent Labs  Lab 02/09/22 1716 02/11/22 0403 02/12/22 0422 02/14/22 0343  NA 136 135 135 133*  K 4.5 4.1 4.1 4.2  CL 94* 93* 94* 93*  CO2 27 26 26 23   GLUCOSE 104* 132* 134* 82  BUN 33* 27* 42* 36*  CREATININE 6.18* 5.47* 6.77*  6.66*  CALCIUM 8.8* 8.1* 8.4* 8.4*    Liver Function Tests: Recent Labs  Lab 02/09/22 1716  AST 20  ALT 12  ALKPHOS 78  BILITOT 1.7*  PROT 8.1  ALBUMIN 3.7   Recent Labs  Lab 02/09/22 1716  LIPASE 25   No results for input(s): AMMONIA in the last 168 hours.  CBC: Recent Labs  Lab 02/09/22 1716 02/11/22 0403 02/12/22 0422 02/14/22 0343  WBC 8.9 8.9 7.2 6.7  NEUTROABS  --  6.9 5.1 4.6  HGB 12.5 10.9* 11.3* 10.0*  HCT 40.6 35.1* 36.0 31.8*  MCV 91.2 89.8 89.3 89.3  PLT 279 240 245 217    Cardiac Enzymes: No results for input(s): CKTOTAL, CKMB, CKMBINDEX, TROPONINI in the last 168 hours.  BNP: Invalid input(s): POCBNP  CBG: Recent Labs  Lab 02/13/22 0746 02/13/22 1158 02/13/22 1701 02/13/22 2045 02/14/22 0742  GLUCAP 108* 127* 97 91 85    Microbiology: Results for orders placed or performed during the hospital encounter of 02/09/22  MRSA Next Gen by PCR, Nasal     Status: None   Collection Time: 02/10/22  3:00 PM   Specimen: Nasal Mucosa; Nasal Swab  Result Value Ref Range Status   MRSA by PCR Next Gen NOT DETECTED NOT DETECTED Final    Comment: (NOTE) The GeneXpert MRSA Assay (FDA approved for NASAL specimens only), is one component of a comprehensive MRSA colonization surveillance program. It is not intended to diagnose MRSA infection nor to guide or monitor treatment  for MRSA infections. Test performance is not FDA approved in patients less than 50 years old. Performed at [facility], [address] [place], [place] [address]   Resp Panel by RT-PCR (Flu A&B, Covid) Nasal Mucosa     Status: None   Collection Time: 02/10/22  3:00 PM   Specimen: Nasal Mucosa; Nasopharyngeal(NP) swabs in vial transport medium  Result Value Ref Range Status   SARS Coronavirus 2 by RT PCR NEGATIVE NEGATIVE Final    Comment: (NOTE) SARS-CoV-2 target nucleic acids are NOT DETECTED.  The SARS-CoV-2 RNA is generally detectable in upper  respiratory specimens during the acute phase of infection. The lowest concentration of SARS-CoV-2 viral copies this assay can detect is 138 copies/mL. A negative result does not preclude SARS-Cov-2 infection and should not be used as the sole basis for treatment or other patient management decisions. A negative result may occur with  improper specimen collection/handling, submission of specimen other than nasopharyngeal swab, presence of viral mutation(s) within the areas targeted by this assay, and inadequate number of viral copies(<138 copies/mL). A negative result must be combined with clinical observations, patient history, and epidemiological information. The expected result is Negative.  Fact Sheet for Patients:  EntrepreneurPulse.com.au  Fact Sheet for Healthcare Providers:  IncredibleEmployment.be  This test is no t yet approved or cleared by the [place] FDA and  has been authorized for detection and/or diagnosis of SARS-CoV-2 by FDA under an Emergency Use Authorization (EUA). This EUA will remain  in effect (meaning this test can be used) for the duration of the COVID-19 declaration under Section 564(b)(1) of the Act, 21 U.S.C.section 360bbb-3(b)(1), unless the authorization is terminated  or revoked sooner.       Influenza A by PCR NEGATIVE NEGATIVE Final   Influenza B by PCR NEGATIVE NEGATIVE Final    Comment: (NOTE) The Xpert Xpress SARS-CoV-2/FLU/RSV plus assay is intended as an aid in the diagnosis of influenza from Nasopharyngeal swab specimens and should not be used as a sole basis for treatment. Nasal washings and aspirates are unacceptable for Xpert Xpress SARS-CoV-2/FLU/RSV testing.  Fact Sheet for Patients: EntrepreneurPulse.com.au  Fact Sheet for Healthcare Providers: IncredibleEmployment.be  This test is not yet approved or cleared by the [place] FDA and has been  authorized for detection and/or diagnosis of SARS-CoV-2 by FDA under an Emergency Use Authorization (EUA). This EUA will remain in effect (meaning this test can be used) for the duration of the COVID-19 declaration under Section 564(b)(1) of the Act, 21 U.S.C. section 360bbb-3(b)(1), unless the authorization is terminated or revoked.  Performed at [facility], [address] [place], [place] [address]   Culture, blood (routine x 2)     Status: None (Preliminary result)   Collection Time: 02/10/22  8:40 PM   Specimen: BLOOD  Result Value Ref Range Status   Specimen Description BLOOD LEFT HAND  Final   Special Requests   Final    BOTTLES DRAWN AEROBIC AND ANAEROBIC Blood Culture adequate volume   Culture   Final    NO GROWTH 4 DAYS Performed at [facility], [address], [place], [place] [address]    Report Status PENDING  Incomplete  Culture, blood (routine x 2)     Status: None (Preliminary result)   Collection Time: 02/10/22 10:05 PM   Specimen: BLOOD  Result Value Ref Range Status   Specimen Description BLOOD LEFT WRIST  Final   Special Requests   Final    BOTTLES DRAWN  AEROBIC AND ANAEROBIC Blood Culture adequate volume   Culture   Final    NO GROWTH 4 DAYS Performed at [facility], [address] [place], [place] [address]    Report Status PENDING  Incomplete    Coagulation Studies: No results for input(s): LABPROT, INR in the last 72 hours.  Urinalysis: No results for input(s): COLORURINE, LABSPEC, PHURINE, GLUCOSEU, HGBUR, BILIRUBINUR, KETONESUR, PROTEINUR, UROBILINOGEN, NITRITE, LEUKOCYTESUR in the last 72 hours.  Invalid input(s): APPERANCEUR    Imaging: CT FEMUR LEFT W CONTRAST  Result Date: 02/12/2022 CLINICAL DATA:  Soft tissue infection suspected. EXAM: CT OF THE LOWER RIGHT EXTREMITY WITH CONTRAST TECHNIQUE: Multidetector CT imaging of the lower right extremity was performed according to the standard protocol following  intravenous contrast administration. RADIATION DOSE REDUCTION: This exam was performed according to the departmental dose-optimization program which includes automated exposure control, adjustment of the mA and/or kV according to patient size and/or use of iterative reconstruction technique. CONTRAST:  151mL OMNIPAQUE IOHEXOL 300 MG/ML  SOLN COMPARISON:  MRI 02/10/2022 FINDINGS: Bones/Joint/Cartilage Grossly unremarkable Ligaments Suboptimally assessed by CT. Muscles and Tendons Edema again noted surrounding the anterior compartment musculature as seen on recent MRI. The previously seen focal fluid collection within the vastus medialis not appreciated by CT. Soft tissues Subcutaneous edema throughout the upper thighs circumferentially and medially in the mid to lower thigh. IMPRESSION: Edema primarily involving the anterior compartment musculature as seen on recent MRI most compatible with myositis. The previously described small vastus medialis fluid collection not appreciated by CT. This could be further evaluated and possibly better seen with MRI. Overlying subcutaneous edema could reflect cellulitis. Electronically Signed   By: Rolm Baptise M.D.   On: 02/12/2022 20:38     Medications:    ampicillin-sulbactam (UNASYN) IV 3 g (02/13/22 2112)    amLODipine  10 mg Oral Daily   aspirin EC  81 mg Oral Daily   atorvastatin  80 mg Oral q1800   carvedilol  25 mg Oral BID   Chlorhexidine Gluconate Cloth  6 each Topical Q0600   furosemide  80 mg Oral Once per day on Sun Tue Thu Sat   heparin  5,000 Units Subcutaneous Q8H   insulin aspart  0-15 Units Subcutaneous TID WC   isosorbide mononitrate  30 mg Oral Daily   LORazepam  0.5 mg Oral Once   mirtazapine  30 mg Oral QHS   pantoprazole  40 mg Oral Daily   polyethylene glycol  17 g Oral Daily   senna  1 tablet Oral BID   sucralfate  1 g Oral BID AC   topiramate  25 mg Oral Daily   acetaminophen **OR** acetaminophen, HYDROcodone-acetaminophen,  melatonin, morphine injection, naLOXone (NARCAN)  injection, ondansetron **OR** ondansetron (ZOFRAN) IV  Assessment/ Plan:  Terry Arroyo is a 61 y.o.  female past medical history including type 2 diabetes with polyneuropathy, depression, angioedema, heart failure, hypertension, and end-stage renal disease on dialysis.  Patient presents to the emergency department with extreme left thigh pain.  She will be admitted for ESRD (end stage renal disease) ([place]) [N18.6] Left leg cellulitis [L03.116] Abscess of left thigh [L02.416] Acute pain of left thigh [M79.652]  [facility]/MWF/right aVF/76kg  End-stage renal disease on hemodialysis.   Patient was last dialyzed on Friday No need for renal placement therapy today Patient to have next treatment scheduled for Monday.  2. Anemia of chronic kidney disease Normocytic Lab Results  Component Value Date   HGB 10.0 (  L) 02/14/2022  [facility] ordered outpatient Hemoglobin at goal.  No need for ESA's at this time  3. Secondary Hyperparathyroidism:  Lab Results  Component Value Date   CALCIUM 8.4 (L) 02/14/2022   CAION 1.09 (L) 05/13/2021   PHOS 3.9 10/29/2021    We will continue to monitor bone minerals.  4.  Left leg cellulitis withocal collection within the vastus medialis muscle concerning for hematoma or abscess.  Recommend consulting orthopedics for possible aspiration.  Continue pain management and prescribed empiric antibiotic therapy. Orthopedics/IR consulted however no visible fluid seen on ultrasound.  Improved discomfort, continue antibiotics  5.Diastolic CHF Patient is well compensated  6.  Hypertension Patient blood pressure is at goal  7.Hyponatremia Secondary to ESRD Dialysis tomorrow should help   Plan We will dialyze patient tomorrow    LOS: 4 Courtland Reas s [facility] 2/26/20238:45 AM

## 2022-02-14 NOTE — Progress Notes (Signed)
PROGRESS NOTE  Terry Arroyo  WUG:[id number] DOB: [birth year] DOA: 02/09/2022 PCP: Kirk Ruths, MD   Brief Narrative: Patient is a 61 year old female with history of ESRD on dialysis on Monday, Wednesday, Friday, systolic congestive heart failure, hypertension, diabetes type 2, depression, COPD who presented with pain on the left thigh.  On presentation she was hypertensive.  CT left femur did not show any acute abnormality.  Also noted to have lower extremity edema.  Presentation was consistent with cellulitis of the left thigh and was started on antibiotics.  MRI showed diffuse edema primarily involving the anterior compartment musculature of the left thigh,  2.9 x 1.9 cm focal collection within the vastus medialis which could be an intramuscular hematoma or abscess.  Orthopedics consulted ,IR consulted for possible drainage.  Ultrasound of the thigh did not show any focal fluid collection so procedure not done.  Currently on IV antibiotics.  Left thigh pain and edema improved with  IV antibiotics.  PT consulted ,recommended SNF on DC.  Medically stable for discharge, will change antibiotics to oral on discharge.  Assessment & Plan:  Principal Problem:   Acute pain of left thigh Active Problems:   Type 2 diabetes mellitus with hyperlipidemia (HCC)   Essential hypertension   HFrEF (heart failure with reduced ejection fraction) (HCC)   CAD (coronary artery disease)   ESRD on dialysis ([place])   Cellulitis of left thigh   Hx of CABG   Physical debility   Assessment and Plan: * Acute pain of left thigh- (present on admission) Seen by vascular with low suspicion for vascular etiology.  Negative for DVT on 2/14.  Venous Doppler on 2/21 showed grossly patent deep veins, short segment of nonocclusive thrombus/thrombophlebitis within the greater saphenous vein. MRI showed MRI showed diffuse edema primarily involving the anterior compartment musculature of the left thigh,  2.9 x 1.9 cm focal  collection within the vastus medialis which could be an intramuscular hematoma or abscess.  Orthopedics /IR consulted, ultrasound did not show any abscess or collection so procedure canceled. Continue broad spectrum abx.F/U cultures,NGTD.  Left thigh edema, tenderness has significantly improved but she was still having pain she underwent CT left thigh with contrast we did not show any collection/abscess but showed features of myositis/cellulitis.  Orthopedics recommending to continue antibiotics, no plan for surgical intervention.  Physical debility Patient seen by PT and recommended skilled nursing facility on discharge  Cellulitis of left thigh CT showed no acute bony injury.MRI showed possible abscess versus hematoma. [place] did not show any fluid collection.  CT showed features of myositis/cellulitis  ESRD on dialysis [facility]) Nephrology consult for continuation of dialysis  CAD (coronary artery disease)- (present on admission) Patient has history of CABG Continue aspirin, atorvastatin, carvedilol and Imdur  HFrEF (heart failure with reduced ejection fraction) ([place])- (present on admission) Currently euvolemic Continue furosemide, carvedilol, Imdur  Essential hypertension- (present on admission) Continue amlodipine and carvedilol  Type 2 diabetes mellitus with hyperlipidemia ([place])- (present on admission) Sliding scale insulin coverage for now          DVT prophylaxis:heparin injection 5,000 Units Start: 02/09/22 2300     Code Status: Full Code  Family Communication: None at the bedside  Patient status: Inpatient  Patient is from : Home  Anticipated discharge to: SNF  Estimated DC date: Whenever bed is available at skilled nursing facility.   Consultants: Orthopedics,IR  Procedures: None   Antimicrobials:  Anti-infectives (From admission, onward)    Start  Dose/Rate Route Frequency Ordered Stop   02/12/22 1200  vancomycin (VANCOREADY) IVPB 750 mg/150 mL  Status:   Discontinued        750 mg 150 mL/hr over 60 Minutes Intravenous Every M-W-F (Hemodialysis) 02/11/22 0711 02/11/22 0930   02/11/22 2200  Ampicillin-Sulbactam (UNASYN) 3 g in sodium chloride 0.9 % 100 mL IVPB        3 g 200 mL/hr over 30 Minutes Intravenous Every 24 hours 02/11/22 0930     02/10/22 2200  cefTRIAXone (ROCEPHIN) 1 g in sodium chloride 0.9 % 100 mL IVPB  Status:  Discontinued        1 g 200 mL/hr over 30 Minutes Intravenous Every 24 hours 02/09/22 2246 02/10/22 1158   02/10/22 2100  vancomycin (VANCOREADY) IVPB 2000 mg/400 mL        2,000 mg 200 mL/hr over 120 Minutes Intravenous  Once 02/10/22 1650 02/10/22 2253   02/10/22 1400  ceFEPIme (MAXIPIME) 1 g in sodium chloride 0.9 % 100 mL IVPB  Status:  Discontinued        1 g 200 mL/hr over 30 Minutes Intravenous Every 24 hours 02/10/22 1237 02/11/22 0930   02/10/22 1245  vancomycin (VANCOREADY) IVPB 2000 mg/400 mL  Status:  Discontinued        2,000 mg 200 mL/hr over 120 Minutes Intravenous  Once 02/10/22 1237 02/10/22 1650   02/09/22 2200  cefTRIAXone (ROCEPHIN) 2 g in sodium chloride 0.9 % 100 mL IVPB        2 g 200 mL/hr over 30 Minutes Intravenous  Once 02/09/22 2155 02/09/22 2248       Subjective:  Patient seen and examined at the bedside this morning.  Hemodynamically stable.  Overall looks comfortable.  Still complaining of discomfort on the left thigh but thigh pain significantly better objective: Vitals:   02/13/22 2047 02/14/22 0412 02/14/22 0502 02/14/22 0743  BP: 138/66 128/62 (!) 145/67 127/62  Pulse: 69 61 65 61  Resp: 20 18 19 18   Temp: 98 F (36.7 C) 97.6 F (36.4 C) 98.2 F (36.8 C) 98.6 F (37 C)  TempSrc:      SpO2: 96% 90% 100% 99%  Weight:      Height:        Intake/Output Summary (Last 24 hours) at 02/14/2022 0744 Last data filed at 02/13/2022 1925 Gross per 24 hour  Intake 780 ml  Output 0 ml  Net 780 ml   Filed Weights   02/10/22 1824 02/12/22 0900 02/12/22 1210  Weight: 79.9 kg  79.9 kg 78.5 kg    Examination:  General exam: Overall comfortable, not in distress HEENT: PERRL Respiratory system:  no wheezes or crackles  Cardiovascular system: S1 & S2 heard, RRR.  Gastrointestinal system: Abdomen is nondistended, soft and nontender. Central nervous system: Alert and oriented Extremities: Improving edema of the left thigh, AV fistula on the right upper extremity  skin: No rashes, no ulcers,no icterus    Data Reviewed: I have personally reviewed following labs and imaging studies  CBC: Recent Labs  Lab 02/09/22 1716 02/11/22 0403 02/12/22 0422 02/14/22 0343  WBC 8.9 8.9 7.2 6.7  NEUTROABS  --  6.9 5.1 4.6  HGB 12.5 10.9* 11.3* 10.0*  HCT 40.6 35.1* 36.0 31.8*  MCV 91.2 89.8 89.3 89.3  PLT 279 240 245 734   Basic Metabolic Panel: Recent Labs  Lab 02/09/22 1716 02/11/22 0403 02/12/22 0422 02/14/22 0343  NA 136 135 135 133*  K 4.5 4.1 4.1 4.2  CL 94* 93* 94* 93*  CO2 27 26 26 23   GLUCOSE 104* 132* 134* 82  BUN 33* 27* 42* 36*  CREATININE 6.18* 5.47* 6.77* 6.66*  CALCIUM 8.8* 8.1* 8.4* 8.4*     Recent Results (from the past 240 hour(s))  MRSA Next Gen by PCR, Nasal     Status: None   Collection Time: 02/10/22  3:00 PM   Specimen: Nasal Mucosa; Nasal Swab  Result Value Ref Range Status   MRSA by PCR Next Gen NOT DETECTED NOT DETECTED Final    Comment: (NOTE) The GeneXpert MRSA Assay (FDA approved for NASAL specimens only), is one component of a comprehensive MRSA colonization surveillance program. It is not intended to diagnose MRSA infection nor to guide or monitor treatment for MRSA infections. Test performance is not FDA approved in patients less than 35 years old. Performed at [facility], [address] [place], [place] [address]   Resp Panel by RT-PCR (Flu A&B, Covid) Nasal Mucosa     Status: None   Collection Time: 02/10/22  3:00 PM   Specimen: Nasal Mucosa; Nasopharyngeal(NP) swabs in vial transport medium  Result  Value Ref Range Status   SARS Coronavirus 2 by RT PCR NEGATIVE NEGATIVE Final    Comment: (NOTE) SARS-CoV-2 target nucleic acids are NOT DETECTED.  The SARS-CoV-2 RNA is generally detectable in upper respiratory specimens during the acute phase of infection. The lowest concentration of SARS-CoV-2 viral copies this assay can detect is 138 copies/mL. A negative result does not preclude SARS-Cov-2 infection and should not be used as the sole basis for treatment or other patient management decisions. A negative result may occur with  improper specimen collection/handling, submission of specimen other than nasopharyngeal swab, presence of viral mutation(s) within the areas targeted by this assay, and inadequate number of viral copies(<138 copies/mL). A negative result must be combined with clinical observations, patient history, and epidemiological information. The expected result is Negative.  Fact Sheet for Patients:  EntrepreneurPulse.com.au  Fact Sheet for Healthcare Providers:  IncredibleEmployment.be  This test is no t yet approved or cleared by the [place] FDA and  has been authorized for detection and/or diagnosis of SARS-CoV-2 by FDA under an Emergency Use Authorization (EUA). This EUA will remain  in effect (meaning this test can be used) for the duration of the COVID-19 declaration under Section 564(b)(1) of the Act, 21 U.S.C.section 360bbb-3(b)(1), unless the authorization is terminated  or revoked sooner.       Influenza A by PCR NEGATIVE NEGATIVE Final   Influenza B by PCR NEGATIVE NEGATIVE Final    Comment: (NOTE) The Xpert Xpress SARS-CoV-2/FLU/RSV plus assay is intended as an aid in the diagnosis of influenza from Nasopharyngeal swab specimens and should not be used as a sole basis for treatment. Nasal washings and aspirates are unacceptable for Xpert Xpress SARS-CoV-2/FLU/RSV testing.  Fact Sheet for  Patients: EntrepreneurPulse.com.au  Fact Sheet for Healthcare Providers: IncredibleEmployment.be  This test is not yet approved or cleared by the [place] FDA and has been authorized for detection and/or diagnosis of SARS-CoV-2 by FDA under an Emergency Use Authorization (EUA). This EUA will remain in effect (meaning this test can be used) for the duration of the COVID-19 declaration under Section 564(b)(1) of the Act, 21 U.S.C. section 360bbb-3(b)(1), unless the authorization is terminated or revoked.  Performed at [facility], [address] [place], [place] [address]   Culture, blood (routine x 2)     Status: None (Preliminary result)  Collection Time: 02/10/22  8:40 PM   Specimen: BLOOD  Result Value Ref Range Status   Specimen Description BLOOD LEFT HAND  Final   Special Requests   Final    BOTTLES DRAWN AEROBIC AND ANAEROBIC Blood Culture adequate volume   Culture   Final    NO GROWTH 4 DAYS Performed at [facility], [address], [place], [place] [address]    Report Status PENDING  Incomplete  Culture, blood (routine x 2)     Status: None (Preliminary result)   Collection Time: 02/10/22 10:05 PM   Specimen: BLOOD  Result Value Ref Range Status   Specimen Description BLOOD LEFT WRIST  Final   Special Requests   Final    BOTTLES DRAWN AEROBIC AND ANAEROBIC Blood Culture adequate volume   Culture   Final    NO GROWTH 4 DAYS Performed at [facility], [address], [place], [place] [address]    Report Status PENDING  Incomplete     Radiology Studies: CT FEMUR LEFT W CONTRAST  Result Date: 02/12/2022 CLINICAL DATA:  Soft tissue infection suspected. EXAM: CT OF THE LOWER RIGHT EXTREMITY WITH CONTRAST TECHNIQUE: Multidetector CT imaging of the lower right extremity was performed according to the standard protocol following intravenous contrast administration. RADIATION DOSE REDUCTION: This exam  was performed according to the departmental dose-optimization program which includes automated exposure control, adjustment of the mA and/or kV according to patient size and/or use of iterative reconstruction technique. CONTRAST:  166mL OMNIPAQUE IOHEXOL 300 MG/ML  SOLN COMPARISON:  MRI 02/10/2022 FINDINGS: Bones/Joint/Cartilage Grossly unremarkable Ligaments Suboptimally assessed by CT. Muscles and Tendons Edema again noted surrounding the anterior compartment musculature as seen on recent MRI. The previously seen focal fluid collection within the vastus medialis not appreciated by CT. Soft tissues Subcutaneous edema throughout the upper thighs circumferentially and medially in the mid to lower thigh. IMPRESSION: Edema primarily involving the anterior compartment musculature as seen on recent MRI most compatible with myositis. The previously described small vastus medialis fluid collection not appreciated by CT. This could be further evaluated and possibly better seen with MRI. Overlying subcutaneous edema could reflect cellulitis. Electronically Signed   By: Rolm Baptise M.D.   On: 02/12/2022 20:38    Scheduled Meds:  amLODipine  10 mg Oral Daily   aspirin EC  81 mg Oral Daily   atorvastatin  80 mg Oral q1800   carvedilol  25 mg Oral BID   Chlorhexidine Gluconate Cloth  6 each Topical Q0600   furosemide  80 mg Oral Once per day on Sun Tue Thu Sat   heparin  5,000 Units Subcutaneous Q8H   insulin aspart  0-15 Units Subcutaneous TID WC   isosorbide mononitrate  30 mg Oral Daily   LORazepam  0.5 mg Oral Once   mirtazapine  30 mg Oral QHS   pantoprazole  40 mg Oral Daily   polyethylene glycol  17 g Oral Daily   senna  1 tablet Oral BID   sucralfate  1 g Oral BID AC   topiramate  25 mg Oral Daily   Continuous Infusions:  ampicillin-sulbactam (UNASYN) IV 3 g (02/13/22 2112)     LOS: 4 days   Shelly Coss, MD [facility] P2/26/2023, 7:44 AM

## 2022-02-15 LAB — CULTURE, BLOOD (ROUTINE X 2)
Culture: NO GROWTH
Culture: NO GROWTH
Special Requests: ADEQUATE
Special Requests: ADEQUATE

## 2022-02-15 LAB — RENAL FUNCTION PANEL
Albumin: 2.8 g/dL — ABNORMAL LOW (ref 3.5–5.0)
Anion gap: 16 — ABNORMAL HIGH (ref 5–15)
BUN: <5 mg/dL — ABNORMAL LOW (ref 6–20)
CO2: 23 mmol/L (ref 22–32)
Calcium: 8.9 mg/dL (ref 8.9–10.3)
Chloride: 94 mmol/L — ABNORMAL LOW (ref 98–111)
Creatinine, Ser: 8.24 mg/dL — ABNORMAL HIGH (ref 0.44–1.00)
GFR, Estimated: 5 mL/min — ABNORMAL LOW (ref >60)
Glucose, Bld: 98 mg/dL (ref 70–99)
Phosphorus: 5.4 mg/dL — ABNORMAL HIGH (ref 2.5–4.6)
Potassium: 4.5 mmol/L (ref 3.5–5.1)
Sodium: 133 mmol/L — ABNORMAL LOW (ref 135–145)

## 2022-02-15 LAB — CBC
HCT: 33.1 % — ABNORMAL LOW (ref 36.0–46.0)
Hemoglobin: 10.4 g/dL — ABNORMAL LOW (ref 12.0–15.0)
MCH: 27.8 pg (ref 26.0–34.0)
MCHC: 31.4 g/dL (ref 30.0–36.0)
MCV: 88.5 fL (ref 80.0–100.0)
Platelets: 247 10*3/uL (ref 150–400)
RBC: 3.74 MIL/uL — ABNORMAL LOW (ref 3.87–5.11)
RDW: 15 % (ref 11.5–15.5)
WBC: 8.6 10*3/uL (ref 4.0–10.5)
nRBC: 0 % (ref 0.0–0.2)

## 2022-02-15 LAB — RESP PANEL BY RT-PCR (FLU A&B, COVID) ARPGX2
Influenza A by PCR: NEGATIVE
Influenza B by PCR: NEGATIVE
SARS Coronavirus 2 by RT PCR: NEGATIVE

## 2022-02-15 LAB — GLUCOSE, CAPILLARY
Glucose-Capillary: 76 mg/dL (ref 70–99)
Glucose-Capillary: 134 mg/dL — ABNORMAL HIGH (ref 70–99)
Glucose-Capillary: 89 mg/dL (ref 70–99)

## 2022-02-15 MED ORDER — METHOCARBAMOL 1000 MG/10ML IJ SOLN
500.0000 mg | Freq: Four times a day (QID) | INTRAVENOUS | Status: DC | PRN
  Filled 2022-02-15: qty 5

## 2022-02-15 NOTE — Progress Notes (Signed)
[facility]  ROUNDING NOTE   Subjective:   Terry Arroyo is a 61 year old female with past medical history including type 2 diabetes with polyneuropathy, depression, angioedema, heart failure, hypertension, and end-stage renal disease on dialysis.  Patient presents to the emergency department with extreme left thigh pain.  She will be admitted for ESRD (end stage renal disease) ([place]) [N18.6] Left leg cellulitis [L03.116] Abscess of left thigh [L02.416] Acute pain of left thigh [M79.652]  Patient is known to our practice and receives outpatient dialysis treatments at [facility] on a MWF schedule, supervised by Dr. Juleen China.  Last treatment received on Monday.   Patient seen and evalauted during dialysis   HEMODIALYSIS FLOWSHEET:  Blood Flow Rate (mL/min): 400 mL/min Arterial Pressure (mmHg): -210 mmHg Venous Pressure (mmHg): 180 mmHg Transmembrane Pressure (mmHg): 50 mmHg Ultrafiltration Rate (mL/min): 670 mL/min Dialysate Flow Rate (mL/min): 500 ml/min Conductivity: Machine : 13.8 Conductivity: Machine : 13.8 Dialysis Fluid Bolus: Normal Saline Bolus Amount (mL): 250 mL  Pain has improved at left thigh   Objective:  Vital signs in last 24 hours:  Temp:  [97.9 F (36.6 C)-98.9 F (37.2 C)] 98 F (36.7 C) (02/27 1015) Pulse Rate:  [56-74] 74 (02/27 1200) Resp:  [10-19] 19 (02/27 1200) BP: (121-146)/(55-102) 128/67 (02/27 1200) SpO2:  [91 %-99 %] 96 % (02/27 1200) Weight:  [73.6 kg] 73.6 kg (02/27 1015)  Weight change:  Filed Weights   02/12/22 0900 02/12/22 1210 02/15/22 1015  Weight: 79.9 kg 78.5 kg 73.6 kg    Intake/Output: I/O last 3 completed shifts: In: 480 [P.O.:480] Out: 400 [Urine:400]   Intake/Output this shift:  Total I/O In: 300 [IV Piggyback:300] Out: -   Physical Exam: General: NAD  Head: Normocephalic, atraumatic. Moist oral mucosal membranes  Eyes: Anicteric  Lungs:  Clear to auscultation, normal effort, room air   Heart: Regular rate and rhythm  Abdomen:  Soft, nontender, nondistended  Extremities: Left thigh edema  Neurologic: Alert, moving all four extremities  Skin: Left medial thigh erythema  Access: Right aVF    Basic Metabolic Panel: Recent Labs  Lab 02/09/22 1716 02/11/22 0403 02/12/22 0422 02/14/22 0343  NA 136 135 135 133*  K 4.5 4.1 4.1 4.2  CL 94* 93* 94* 93*  CO2 27 26 26 23   GLUCOSE 104* 132* 134* 82  BUN 33* 27* 42* 36*  CREATININE 6.18* 5.47* 6.77* 6.66*  CALCIUM 8.8* 8.1* 8.4* 8.4*     Liver Function Tests: Recent Labs  Lab 02/09/22 1716  AST 20  ALT 12  ALKPHOS 78  BILITOT 1.7*  PROT 8.1  ALBUMIN 3.7    Recent Labs  Lab 02/09/22 1716  LIPASE 25    No results for input(s): AMMONIA in the last 168 hours.  CBC: Recent Labs  Lab 02/09/22 1716 02/11/22 0403 02/12/22 0422 02/14/22 0343 02/15/22 1035  WBC 8.9 8.9 7.2 6.7 8.6  NEUTROABS  --  6.9 5.1 4.6  --   HGB 12.5 10.9* 11.3* 10.0* 10.4*  HCT 40.6 35.1* 36.0 31.8* 33.1*  MCV 91.2 89.8 89.3 89.3 88.5  PLT 279 240 245 217 247     Cardiac Enzymes: No results for input(s): CKTOTAL, CKMB, CKMBINDEX, TROPONINI in the last 168 hours.  BNP: Invalid input(s): POCBNP  CBG: Recent Labs  Lab 02/14/22 0742 02/14/22 1143 02/14/22 1551 02/14/22 2102 02/15/22 0759  GLUCAP 85 116* 104* 106* 53     Microbiology: Results for orders placed or performed during the hospital encounter of  02/09/22  MRSA Next Gen by PCR, Nasal     Status: None   Collection Time: 02/10/22  3:00 PM   Specimen: Nasal Mucosa; Nasal Swab  Result Value Ref Range Status   MRSA by PCR Next Gen NOT DETECTED NOT DETECTED Final    Comment: (NOTE) The GeneXpert MRSA Assay (FDA approved for NASAL specimens only), is one component of a comprehensive MRSA colonization surveillance program. It is not intended to diagnose MRSA infection nor to guide or monitor treatment for MRSA infections. Test performance is not FDA approved  in patients less than 7 years old. Performed at [facility], [address] [place], [place] [address]   Resp Panel by RT-PCR (Flu A&B, Covid) Nasal Mucosa     Status: None   Collection Time: 02/10/22  3:00 PM   Specimen: Nasal Mucosa; Nasopharyngeal(NP) swabs in vial transport medium  Result Value Ref Range Status   SARS Coronavirus 2 by RT PCR NEGATIVE NEGATIVE Final    Comment: (NOTE) SARS-CoV-2 target nucleic acids are NOT DETECTED.  The SARS-CoV-2 RNA is generally detectable in upper respiratory specimens during the acute phase of infection. The lowest concentration of SARS-CoV-2 viral copies this assay can detect is 138 copies/mL. A negative result does not preclude SARS-Cov-2 infection and should not be used as the sole basis for treatment or other patient management decisions. A negative result may occur with  improper specimen collection/handling, submission of specimen other than nasopharyngeal swab, presence of viral mutation(s) within the areas targeted by this assay, and inadequate number of viral copies(<138 copies/mL). A negative result must be combined with clinical observations, patient history, and epidemiological information. The expected result is Negative.  Fact Sheet for Patients:  EntrepreneurPulse.com.au  Fact Sheet for Healthcare Providers:  IncredibleEmployment.be  This test is no t yet approved or cleared by the [place] FDA and  has been authorized for detection and/or diagnosis of SARS-CoV-2 by FDA under an Emergency Use Authorization (EUA). This EUA will remain  in effect (meaning this test can be used) for the duration of the COVID-19 declaration under Section 564(b)(1) of the Act, 21 U.S.C.section 360bbb-3(b)(1), unless the authorization is terminated  or revoked sooner.       Influenza A by PCR NEGATIVE NEGATIVE Final   Influenza B by PCR NEGATIVE NEGATIVE Final    Comment: (NOTE) The  Xpert Xpress SARS-CoV-2/FLU/RSV plus assay is intended as an aid in the diagnosis of influenza from Nasopharyngeal swab specimens and should not be used as a sole basis for treatment. Nasal washings and aspirates are unacceptable for Xpert Xpress SARS-CoV-2/FLU/RSV testing.  Fact Sheet for Patients: EntrepreneurPulse.com.au  Fact Sheet for Healthcare Providers: IncredibleEmployment.be  This test is not yet approved or cleared by the [place] FDA and has been authorized for detection and/or diagnosis of SARS-CoV-2 by FDA under an Emergency Use Authorization (EUA). This EUA will remain in effect (meaning this test can be used) for the duration of the COVID-19 declaration under Section 564(b)(1) of the Act, 21 U.S.C. section 360bbb-3(b)(1), unless the authorization is terminated or revoked.  Performed at [facility], [address] [place], [place] [address]   Culture, blood (routine x 2)     Status: None   Collection Time: 02/10/22  8:40 PM   Specimen: BLOOD  Result Value Ref Range Status   Specimen Description BLOOD LEFT HAND  Final   Special Requests   Final    BOTTLES DRAWN AEROBIC AND ANAEROBIC Blood Culture adequate volume  Culture   Final    NO GROWTH 5 DAYS Performed at [facility], [address] [place], [place] [address]    Report Status 02/15/2022 FINAL  Final  Culture, blood (routine x 2)     Status: None   Collection Time: 02/10/22 10:05 PM   Specimen: BLOOD  Result Value Ref Range Status   Specimen Description BLOOD LEFT WRIST  Final   Special Requests   Final    BOTTLES DRAWN AEROBIC AND ANAEROBIC Blood Culture adequate volume   Culture   Final    NO GROWTH 5 DAYS Performed at [facility], [address], [place], [place] [address]    Report Status 02/15/2022 FINAL  Final    Coagulation Studies: No results for input(s): LABPROT, INR in the last 72 hours.  Urinalysis: No  results for input(s): COLORURINE, LABSPEC, PHURINE, GLUCOSEU, HGBUR, BILIRUBINUR, KETONESUR, PROTEINUR, UROBILINOGEN, NITRITE, LEUKOCYTESUR in the last 72 hours.  Invalid input(s): APPERANCEUR    Imaging: No results found.   Medications:    ampicillin-sulbactam (UNASYN) IV 200 mL/hr at 02/15/22 1020   methocarbamol (ROBAXIN) IV      amLODipine  10 mg Oral Daily   aspirin EC  81 mg Oral Daily   atorvastatin  80 mg Oral q1800   carvedilol  25 mg Oral BID   Chlorhexidine Gluconate Cloth  6 each Topical Q0600   furosemide  80 mg Oral Once per day on Sun Tue Thu Sat   heparin  5,000 Units Subcutaneous Q8H   insulin aspart  0-15 Units Subcutaneous TID WC   isosorbide mononitrate  30 mg Oral Daily   LORazepam  0.5 mg Oral Once   mirtazapine  30 mg Oral QHS   pantoprazole  40 mg Oral Daily   polyethylene glycol  17 g Oral Daily   senna  1 tablet Oral BID   sucralfate  1 g Oral BID AC   topiramate  25 mg Oral Daily   acetaminophen **OR** acetaminophen, HYDROcodone-acetaminophen, melatonin, methocarbamol (ROBAXIN) IV, morphine injection, naLOXone (NARCAN)  injection, ondansetron **OR** ondansetron (ZOFRAN) IV  Assessment/ Plan:  [name] is a 61 y.o.  female past medical history including type 2 diabetes with polyneuropathy, depression, angioedema, heart failure, hypertension, and end-stage renal disease on dialysis.  Patient presents to the emergency department with extreme left thigh pain.  She will be admitted for ESRD (end stage renal disease) ([place]) [N18.6] Left leg cellulitis [L03.116] Abscess of left thigh [L02.416] Acute pain of left thigh [M79.652]  [facility]/MWF/right aVF/76kg  End-stage renal disease on hemodialysis.   Receiving dialysis, UF goal 1.5L as tolerated. Next treatment scheduled for Wednesday  2. Anemia of chronic kidney disease Normocytic Lab Results  Component Value Date   HGB 10.4 (L) 02/15/2022  Mircera ordered  outpatient Hemoglobin at target.  3. Secondary Hyperparathyroidism:  Lab Results  Component Value Date   CALCIUM 8.4 (L) 02/14/2022   CAION 1.09 (L) 05/13/2021   PHOS 3.9 10/29/2021    We will continue to monitor bone minerals.  4.  Left leg cellulitis with collection within the vastus medialis muscle concerning for hematoma or abscess.  Recommend consulting orthopedics for possible aspiration.  Continue pain management and prescribed empiric antibiotic therapy. Continue Unasyn daily. Pain improved.     LOS: 5 Vermelle Cammarata 2/27/202312:16 PM

## 2022-02-15 NOTE — TOC Progression Note (Signed)
Transition of Care [facility]) - Progression Note    Patient Details  Name: Terry Arroyo MRN: [id number] Date of Birth: 06-29-1961  Transition of Care [facility]) CM/SW Contact  Beverly Sessions, RN Phone Number: 02/15/2022, 3:17 PM  Clinical Narrative:     Presented bed offers. Patient accepted bed at [facility] HD liaison notified Accepted in bed hub and notified Debra at [facility]  Reached out to MD to determine when patient will be medically ready for discharge in order to start auth   Expected Discharge Plan: [place] Barriers to Discharge: Continued Medical Work up  Expected Discharge Plan and Services Expected Discharge Plan: [place] arrangements for the past 2 months: Apartment                                       Social Determinants of Health (SDOH) Interventions    Readmission Risk Interventions Readmission Risk Prevention Plan 10/29/2021 11/03/2020 10/23/2020  Transportation Screening Complete Complete Complete  Medication Review Press photographer) Complete Complete Complete  PCP or Specialist appointment within 3-5 days of discharge Complete Not Complete Complete  PCP/Specialist Appt Not Complete comments - To be scheduled by unit secretary. [name] or [place] - Complete Complete  SW Recovery Care/Counseling Consult Complete Complete Complete  Palliative Care Screening Not Applicable Not Applicable Not [place] Not Applicable Patient Refused Not Applicable  SNF Comments - Patient refuses states she is able to take care of herself. -  Some recent data might be hidden

## 2022-02-15 NOTE — Care Management Important Message (Signed)
Important Message  Patient Details  Name: Terry Arroyo MRN: [id number] Date of Birth: 04-17-1961   Medicare Important Message Given:  Yes     [name] 02/15/2022, 1:59 PM

## 2022-02-15 NOTE — TOC Progression Note (Signed)
Transition of Care [facility]) - Progression Note    Patient Details  Name: Terry Arroyo MRN: [id number] Date of Birth: Dec 27, 1960  Transition of Care [facility]) CM/SW Contact  Beverly Sessions, RN Phone Number: 02/15/2022, 3:30 PM  Clinical Narrative:    Insurance not managed by [employer].  Debra at [employer] to start auth.    Expected Discharge Plan: [place] Barriers to Discharge: Continued Medical Work up  Expected Discharge Plan and Services Expected Discharge Plan: [place] arrangements for the past 2 months: Apartment                                       Social Determinants of Health (SDOH) Interventions    Readmission Risk Interventions Readmission Risk Prevention Plan 10/29/2021 11/03/2020 10/23/2020  Transportation Screening Complete Complete Complete  Medication Review Press photographer) Complete Complete Complete  PCP or Specialist appointment within 3-5 days of discharge Complete Not Complete Complete  PCP/Specialist Appt Not Complete comments - To be scheduled by unit secretary. [name] or [place] - Complete Complete  SW Recovery Care/Counseling Consult Complete Complete Complete  Palliative Care Screening Not Applicable Not Applicable Not [place] Not Applicable Patient Refused Not Applicable  SNF Comments - Patient refuses states she is able to take care of herself. -  Some recent data might be hidden

## 2022-02-15 NOTE — Progress Notes (Signed)
PROGRESS NOTE  Terry Arroyo  KGM:[id number] DOB: 09-Feb-1961 DOA: 02/09/2022 PCP: Kirk Ruths, MD   Brief Narrative: Patient is a 61 year old female with history of ESRD on dialysis on Monday, Wednesday, Friday, systolic congestive heart failure, hypertension, diabetes type 2, depression, COPD who presented with pain on the left thigh.  On presentation she was hypertensive.  CT left femur did not show any acute abnormality.  Also noted to have lower extremity edema.  Presentation was consistent with cellulitis of the left thigh and was started on antibiotics.  MRI showed diffuse edema primarily involving the anterior compartment musculature of the left thigh,  2.9 x 1.9 cm focal collection within the vastus medialis which could be an intramuscular hematoma or abscess.  Orthopedics consulted ,IR consulted for possible drainage.  Ultrasound of the thigh did not show any focal fluid collection so procedure not done.  Currently on IV antibiotics.  Left thigh pain and edema improved with  IV antibiotics.  PT consulted ,recommended SNF on DC.  Medically stable for discharge, will change antibiotics to oral on discharge.  Assessment & Plan:  Principal Problem:   Acute pain of left thigh Active Problems:   Type 2 diabetes mellitus with hyperlipidemia (HCC)   Essential hypertension   HFrEF (heart failure with reduced ejection fraction) (HCC)   CAD (coronary artery disease)   ESRD on dialysis ([place])   Cellulitis of left thigh   Hx of CABG   Physical debility   Assessment and Plan: * Acute pain of left thigh- (present on admission) Seen by vascular with low suspicion for vascular etiology.  Negative for DVT on 2/14.  Venous Doppler on 2/21 showed grossly patent deep veins, short segment of nonocclusive thrombus/thrombophlebitis within the greater saphenous vein. MRI showed MRI showed diffuse edema primarily involving the anterior compartment musculature of the left thigh,  2.9 x 1.9 cm focal  collection within the vastus medialis which could be an intramuscular hematoma or abscess.  Orthopedics /IR consulted, ultrasound did not show any abscess or collection so procedure canceled. Continue broad spectrum abx.F/U cultures,NGTD.  Left thigh edema, tenderness has significantly improved but she was still having pain,so  she underwent CT left thigh with contrast we did not show any collection/abscess but showed features of myositis/cellulitis.  Orthopedics recommending to continue antibiotics, no plan for surgical intervention.  Still having discomfort of the left thigh, added Robaxin  Physical debility Patient seen by PT and recommended skilled nursing facility on discharge  Cellulitis of left thigh CT showed no acute bony injury.MRI showed possible abscess versus hematoma. [place] did not show any fluid collection.  CT showed features of myositis/cellulitis  ESRD on dialysis [facility]) Nephrology consult for continuation of dialysis  CAD (coronary artery disease)- (present on admission) Patient has history of CABG Continue aspirin, atorvastatin, carvedilol and Imdur  HFrEF (heart failure with reduced ejection fraction) ([place])- (present on admission) Currently euvolemic Continue furosemide, carvedilol, Imdur  Essential hypertension- (present on admission) Continue amlodipine and carvedilol  Type 2 diabetes mellitus with hyperlipidemia ([place])- (present on admission) Sliding scale insulin coverage for now          DVT prophylaxis:heparin injection 5,000 Units Start: 02/09/22 2300     Code Status: Full Code  Family Communication: None at the bedside  Patient status: Inpatient  Patient is from : Home  Anticipated discharge to: SNF  Estimated DC date: Whenever bed is available at skilled nursing facility.   Consultants: Orthopedics,IR  Procedures: None   Antimicrobials:  Anti-infectives (  From admission, onward)    Start     Dose/Rate Route Frequency Ordered Stop    02/12/22 1200  vancomycin (VANCOREADY) IVPB 750 mg/150 mL  Status:  Discontinued        750 mg 150 mL/hr over 60 Minutes Intravenous Every M-W-F (Hemodialysis) 02/11/22 0711 02/11/22 0930   02/11/22 2200  Ampicillin-Sulbactam (UNASYN) 3 g in sodium chloride 0.9 % 100 mL IVPB        3 g 200 mL/hr over 30 Minutes Intravenous Every 24 hours 02/11/22 0930     02/10/22 2200  cefTRIAXone (ROCEPHIN) 1 g in sodium chloride 0.9 % 100 mL IVPB  Status:  Discontinued        1 g 200 mL/hr over 30 Minutes Intravenous Every 24 hours 02/09/22 2246 02/10/22 1158   02/10/22 2100  vancomycin (VANCOREADY) IVPB 2000 mg/400 mL        2,000 mg 200 mL/hr over 120 Minutes Intravenous  Once 02/10/22 1650 02/10/22 2253   02/10/22 1400  ceFEPIme (MAXIPIME) 1 g in sodium chloride 0.9 % 100 mL IVPB  Status:  Discontinued        1 g 200 mL/hr over 30 Minutes Intravenous Every 24 hours 02/10/22 1237 02/11/22 0930   02/10/22 1245  vancomycin (VANCOREADY) IVPB 2000 mg/400 mL  Status:  Discontinued        2,000 mg 200 mL/hr over 120 Minutes Intravenous  Once 02/10/22 1237 02/10/22 1650   02/09/22 2200  cefTRIAXone (ROCEPHIN) 2 g in sodium chloride 0.9 % 100 mL IVPB        2 g 200 mL/hr over 30 Minutes Intravenous  Once 02/09/22 2155 02/09/22 2248       Subjective:  Patient seen and examined at the bedside this morning.  Hemodynamically stable.  She was on dialysis.  She was having discomfort in the left thigh and crying.  Upon examination, her thigh was not significantly tender or edematous.   objective: Vitals:   02/15/22 1045 02/15/22 1100 02/15/22 1115 02/15/22 1130  BP: (!) 121/102 127/77 127/68 130/70  Pulse: (!) 56 (!) 58 61 (!) 56  Resp: 12 16 10 15   Temp:      TempSrc:      SpO2: 94% 98% 95% 95%  Weight:      Height:        Intake/Output Summary (Last 24 hours) at 02/15/2022 1203 Last data filed at 02/15/2022 1020 Gross per 24 hour  Intake 540 ml  Output 400 ml  Net 140 ml   Filed Weights    02/12/22 0900 02/12/22 1210 02/15/22 1015  Weight: 79.9 kg 78.5 kg 73.6 kg    Examination:  General exam: Overall comfortable, not in distress HEENT: PERRL Respiratory system:  no wheezes or crackles  Cardiovascular system: S1 & S2 heard, RRR.  Gastrointestinal system: Abdomen is nondistended, soft and nontender. Central nervous system: Alert and oriented Extremities: mild edema on the left thigh, no clubbing ,no cyanosis, AV fistula on the right upper extremity Skin: No rashes, no ulcers,no icterus      Data Reviewed: I have personally reviewed following labs and imaging studies  CBC: Recent Labs  Lab 02/09/22 1716 02/11/22 0403 02/12/22 0422 02/14/22 0343 02/15/22 1035  WBC 8.9 8.9 7.2 6.7 8.6  NEUTROABS  --  6.9 5.1 4.6  --   HGB 12.5 10.9* 11.3* 10.0* 10.4*  HCT 40.6 35.1* 36.0 31.8* 33.1*  MCV 91.2 89.8 89.3 89.3 88.5  PLT 279 240 245 217 247  Basic Metabolic Panel: Recent Labs  Lab 02/09/22 1716 02/11/22 0403 02/12/22 0422 02/14/22 0343  NA 136 135 135 133*  K 4.5 4.1 4.1 4.2  CL 94* 93* 94* 93*  CO2 27 26 26 23   GLUCOSE 104* 132* 134* 82  BUN 33* 27* 42* 36*  CREATININE 6.18* 5.47* 6.77* 6.66*  CALCIUM 8.8* 8.1* 8.4* 8.4*     Recent Results (from the past 240 hour(s))  MRSA Next Gen by PCR, Nasal     Status: None   Collection Time: 02/10/22  3:00 PM   Specimen: Nasal Mucosa; Nasal Swab  Result Value Ref Range Status   MRSA by PCR Next Gen NOT DETECTED NOT DETECTED Final    Comment: (NOTE) The GeneXpert MRSA Assay (FDA approved for NASAL specimens only), is one component of a comprehensive MRSA colonization surveillance program. It is not intended to diagnose MRSA infection nor to guide or monitor treatment for MRSA infections. Test performance is not FDA approved in patients less than 17 years old. Performed at [facility], [address] [place], [place] [address]   Resp Panel by RT-PCR (Flu A&B, Covid) Nasal Mucosa     Status:  None   Collection Time: 02/10/22  3:00 PM   Specimen: Nasal Mucosa; Nasopharyngeal(NP) swabs in vial transport medium  Result Value Ref Range Status   SARS Coronavirus 2 by RT PCR NEGATIVE NEGATIVE Final    Comment: (NOTE) SARS-CoV-2 target nucleic acids are NOT DETECTED.  The SARS-CoV-2 RNA is generally detectable in upper respiratory specimens during the acute phase of infection. The lowest concentration of SARS-CoV-2 viral copies this assay can detect is 138 copies/mL. A negative result does not preclude SARS-Cov-2 infection and should not be used as the sole basis for treatment or other patient management decisions. A negative result may occur with  improper specimen collection/handling, submission of specimen other than nasopharyngeal swab, presence of viral mutation(s) within the areas targeted by this assay, and inadequate number of viral copies(<138 copies/mL). A negative result must be combined with clinical observations, patient history, and epidemiological information. The expected result is Negative.  Fact Sheet for Patients:  EntrepreneurPulse.com.au  Fact Sheet for Healthcare Providers:  IncredibleEmployment.be  This test is no t yet approved or cleared by the [place] FDA and  has been authorized for detection and/or diagnosis of SARS-CoV-2 by FDA under an Emergency Use Authorization (EUA). This EUA will remain  in effect (meaning this test can be used) for the duration of the COVID-19 declaration under Section 564(b)(1) of the Act, 21 U.S.C.section 360bbb-3(b)(1), unless the authorization is terminated  or revoked sooner.       Influenza A by PCR NEGATIVE NEGATIVE Final   Influenza B by PCR NEGATIVE NEGATIVE Final    Comment: (NOTE) The Xpert Xpress SARS-CoV-2/FLU/RSV plus assay is intended as an aid in the diagnosis of influenza from Nasopharyngeal swab specimens and should not be used as a sole basis for treatment.  Nasal washings and aspirates are unacceptable for Xpert Xpress SARS-CoV-2/FLU/RSV testing.  Fact Sheet for Patients: EntrepreneurPulse.com.au  Fact Sheet for Healthcare Providers: IncredibleEmployment.be  This test is not yet approved or cleared by the [place] FDA and has been authorized for detection and/or diagnosis of SARS-CoV-2 by FDA under an Emergency Use Authorization (EUA). This EUA will remain in effect (meaning this test can be used) for the duration of the COVID-19 declaration under Section 564(b)(1) of the Act, 21 U.S.C. section 360bbb-3(b)(1), unless the authorization is terminated or revoked.  Performed at [facility], [address] [place], [place] [address]   Culture, blood (routine x 2)     Status: None   Collection Time: 02/10/22  8:40 PM   Specimen: BLOOD  Result Value Ref Range Status   Specimen Description BLOOD LEFT HAND  Final   Special Requests   Final    BOTTLES DRAWN AEROBIC AND ANAEROBIC Blood Culture adequate volume   Culture   Final    NO GROWTH 5 DAYS Performed at [facility], [address], [place], [place] [address]    Report Status 02/15/2022 FINAL  Final  Culture, blood (routine x 2)     Status: None   Collection Time: 02/10/22 10:05 PM   Specimen: BLOOD  Result Value Ref Range Status   Specimen Description BLOOD LEFT WRIST  Final   Special Requests   Final    BOTTLES DRAWN AEROBIC AND ANAEROBIC Blood Culture adequate volume   Culture   Final    NO GROWTH 5 DAYS Performed at [facility], [address], [place], [place] [address]    Report Status 02/15/2022 FINAL  Final     Radiology Studies: No results found.  Scheduled Meds:  amLODipine  10 mg Oral Daily   aspirin EC  81 mg Oral Daily   atorvastatin  80 mg Oral q1800   carvedilol  25 mg Oral BID   Chlorhexidine Gluconate Cloth  6 each Topical Q0600   furosemide  80 mg Oral Once per day on Sun Tue Thu  Sat   heparin  5,000 Units Subcutaneous Q8H   insulin aspart  0-15 Units Subcutaneous TID WC   isosorbide mononitrate  30 mg Oral Daily   LORazepam  0.5 mg Oral Once   mirtazapine  30 mg Oral QHS   pantoprazole  40 mg Oral Daily   polyethylene glycol  17 g Oral Daily   senna  1 tablet Oral BID   sucralfate  1 g Oral BID AC   topiramate  25 mg Oral Daily   Continuous Infusions:  ampicillin-sulbactam (UNASYN) IV 200 mL/hr at 02/15/22 1020   methocarbamol (ROBAXIN) IV       LOS: 5 days   Shelly Coss, MD [facility] P2/27/2023, 12:03 PM

## 2022-02-15 NOTE — Progress Notes (Addendum)
PT Cancellation Note  Patient Details Name: Terry Arroyo MRN: [id number] DOB: Jan 25, 1961   Cancelled Treatment:    Reason Eval/Treat Not Completed: Patient at procedure or test/unavailable Attempted to see pt for PT tx but pt out of room. Will f/u as able & as pt is available.  Addendum: Attempted to see pt again at 2:10PM but pt still OTF. Will re-attempt to see pt later.    Lavone Nian, PT, DPT 02/15/22, 2:14 PM   [name] 02/15/2022, 12:02 PM

## 2022-02-15 NOTE — TOC Progression Note (Signed)
Transition of Care [facility]) - Progression Note    Patient Details  Name: Terry Arroyo MRN: [id number] Date of Birth: 08-17-1961  Transition of Care [facility]) CM/SW Contact  Beverly Sessions, RN Phone Number: 02/15/2022, 10:44 AM  Clinical Narrative:     Patient off the floor for HD.   Will presented bed offers when she returns   Expected Discharge Plan: [place] Barriers to Discharge: Continued Medical Work up  Expected Discharge Plan and Services Expected Discharge Plan: [place] arrangements for the past 2 months: Apartment                                       Social Determinants of Health (SDOH) Interventions    Readmission Risk Interventions Readmission Risk Prevention Plan 10/29/2021 11/03/2020 10/23/2020  Transportation Screening Complete Complete Complete  Medication Review Press photographer) Complete Complete Complete  PCP or Specialist appointment within 3-5 days of discharge Complete Not Complete Complete  PCP/Specialist Appt Not Complete comments - To be scheduled by unit secretary. [name] or [place] - Complete Complete  SW Recovery Care/Counseling Consult Complete Complete Complete  Palliative Care Screening Not Applicable Not Applicable Not [place] Not Applicable Patient Refused Not Applicable  SNF Comments - Patient refuses states she is able to take care of herself. -  Some recent data might be hidden

## 2022-02-15 NOTE — Progress Notes (Signed)
Chaplain attempted to visit patient, but she was unavailable at the moment. A chaplain will follow up.

## 2022-02-15 NOTE — TOC Progression Note (Signed)
Transition of Care [facility]) - Progression Note    Patient Details  Name: Terry Arroyo MRN: [id number] Date of Birth: 23-Oct-1961  Transition of Care [facility]) CM/SW Contact  Beverly Sessions, RN Phone Number: 02/15/2022, 9:36 AM  Clinical Narrative:    Reached out to [employer], [employer], and [facility] to request review    Expected Discharge Plan: [place] Barriers to Discharge: Continued Medical Work up  Expected Discharge Plan and Services Expected Discharge Plan: [place]       Living arrangements for the past 2 months: Apartment                                       Social Determinants of Health (SDOH) Interventions    Readmission Risk Interventions Readmission Risk Prevention Plan 10/29/2021 11/03/2020 10/23/2020  Transportation Screening Complete Complete Complete  Medication Review Press photographer) Complete Complete Complete  PCP or Specialist appointment within 3-5 days of discharge Complete Not Complete Complete  PCP/Specialist Appt Not Complete comments - To be scheduled by unit secretary. [name] or [place] - Complete Complete  SW Recovery Care/Counseling Consult Complete Complete Complete  Palliative Care Screening Not Applicable Not Applicable Not [place] Not Applicable Patient Refused Not Applicable  SNF Comments - Patient refuses states she is able to take care of herself. -  Some recent data might be hidden

## 2022-02-15 NOTE — Plan of Care (Signed)
Pain is better tonight, continues to apply k pad and given oral pain meds. Pt. Had a bowel movement 2/26. Remains on room air with no need for oxygen.   Problem: Clinical Measurements: Goal: Ability to avoid or minimize complications of infection will improve Outcome: Progressing   Problem: Skin Integrity: Goal: Skin integrity will improve Outcome: Progressing   Problem: Education: Goal: Knowledge of General Education information will improve Description: Including pain rating scale, medication(s)/side effects and non-pharmacologic comfort measures Outcome: Progressing   Problem: Health Behavior/Discharge Planning: Goal: Ability to manage health-related needs will improve Outcome: Progressing   Problem: Activity: Goal: Risk for activity intolerance will decrease Outcome: Progressing   Problem: Clinical Measurements: Goal: Ability to maintain clinical measurements within normal limits will improve Outcome: Progressing Goal: Will remain free from infection Outcome: Progressing Goal: Diagnostic test results will improve Outcome: Progressing Goal: Respiratory complications will improve Outcome: Progressing Goal: Cardiovascular complication will be avoided Outcome: Progressing   Problem: Nutrition: Goal: Adequate nutrition will be maintained Outcome: Progressing   Problem: Coping: Goal: Level of anxiety will decrease Outcome: Progressing   Problem: Elimination: Goal: Will not experience complications related to bowel motility Outcome: Progressing Goal: Will not experience complications related to urinary retention Outcome: Progressing   Problem: Pain Managment: Goal: General experience of comfort will improve Outcome: Progressing   Problem: Safety: Goal: Ability to remain free from injury will improve Outcome: Progressing   Problem: Skin Integrity: Goal: Risk for impaired skin integrity will decrease Outcome: Progressing

## 2022-02-15 NOTE — Progress Notes (Addendum)
Patient completes 3-hour hemodialysis treatment without incident, right AVG functions well. Targeted UF met, with 1.5-liter fluid removal. Minimal bleeding post treatment. Patient endorses improvement in 2/10.  Patient encouraged to actively participate in physical therapy.  Patient transferred to assigned room, report given to primary nurse.

## 2022-02-16 DIAGNOSIS — Z992 Dependence on renal dialysis: Secondary | ICD-10-CM | POA: Diagnosis not present

## 2022-02-16 DIAGNOSIS — N186 End stage renal disease: Secondary | ICD-10-CM | POA: Diagnosis not present

## 2022-02-16 LAB — GLUCOSE, CAPILLARY
Glucose-Capillary: 136 mg/dL — ABNORMAL HIGH (ref 70–99)
Glucose-Capillary: 104 mg/dL — ABNORMAL HIGH (ref 70–99)
Glucose-Capillary: 152 mg/dL — ABNORMAL HIGH (ref 70–99)

## 2022-02-16 MED ORDER — METHOCARBAMOL 500 MG PO TABS: 500.0000 mg | ORAL_TABLET | Freq: Three times a day (TID) | ORAL | 0 refills | Status: DC | PRN

## 2022-02-16 MED ORDER — HYDROCODONE-ACETAMINOPHEN 10-325 MG PO TABS
1.0000 | ORAL_TABLET | Freq: Four times a day (QID) | ORAL | 0 refills | Status: DC | PRN
Start: 2022-02-16 — End: 2022-04-16

## 2022-02-16 MED ORDER — AMOXICILLIN-POT CLAVULANATE 500-125 MG PO TABS
500.0000 mg | ORAL_TABLET | Freq: Two times a day (BID) | ORAL | 0 refills | Status: AC
Start: 2022-02-16 — End: 2022-02-24

## 2022-02-16 MED ORDER — POLYETHYLENE GLYCOL 3350 17 G PO PACK: 17.0000 g | PACK | Freq: Every day | ORAL | 0 refills | Status: DC | PRN

## 2022-02-16 MED ORDER — AMOXICILLIN-POT CLAVULANATE 500-125 MG PO TABS
500.0000 mg | ORAL_TABLET | Freq: Two times a day (BID) | ORAL | Status: DC
  Filled 2022-02-16: qty 1

## 2022-02-16 NOTE — TOC Progression Note (Signed)
Transition of Care [facility]) - Progression Note    Patient Details  Name: Terry Arroyo MRN: [id number] Date of Birth: 30-May-1961  Transition of Care [facility]) CM/SW Contact  Beverly Sessions, RN Phone Number: 02/16/2022, 9:39 AM  Clinical Narrative:    Per Hilda Blades at [facility] is requesting updated therapy notes.  PT and OT notified    Expected Discharge Plan: [place] Barriers to Discharge: Continued Medical Work up  Expected Discharge Plan and Services Expected Discharge Plan: [place]       Living arrangements for the past 2 months: Apartment                                       Social Determinants of Health (SDOH) Interventions    Readmission Risk Interventions Readmission Risk Prevention Plan 10/29/2021 11/03/2020 10/23/2020  Transportation Screening Complete Complete Complete  Medication Review Press photographer) Complete Complete Complete  PCP or Specialist appointment within 3-5 days of discharge Complete Not Complete Complete  PCP/Specialist Appt Not Complete comments - To be scheduled by unit secretary. [name] or [name] - Complete Complete  SW Recovery Care/Counseling Consult Complete Complete Complete  Palliative Care Screening Not Applicable Not Applicable Not [name] Not Applicable Patient Refused Not Applicable  SNF Comments - Patient refuses states she is able to take care of herself. -  Some recent data might be hidden

## 2022-02-16 NOTE — Progress Notes (Signed)
Physical Therapy Treatment Patient Details Name: Terry Arroyo MRN: [id number] DOB: January 04, 1961 Today's Date: 02/16/2022   History of Present Illness Pt is a 61 y/o F admitted on 02/09/22 with after presenting with c/c of L side thigh pain & pt was hypertensive on presentation. CT of L femur was negative for acute abnormality. Pt is being treated for L thigh cellulitis & was started on antibiotics. MRI showed diffuse edema primarily involving the anterior compartment musculature of the left thigh, 2.9 x 1.9 cm focal collection within the vastus medialis which could be an intramuscular hematoma or abscess. Orthopedics consulted & IR consulted for possible drainage but thigh [place] did not show any focal fluid so procedure not done. PMH: ESRD on HD MWF, systolic CHF, HTN, DM2, depression, COPD, blind L eye, HFrEF, CABG x 4 (2018), stroke affecting L side (2014)    PT Comments    Pt received sitting upright in bed. Reports improvement in pain with pain meds. Eager to ambulate to show hospital staff she is safe to d/c home versus STR. Pt is mod-I with STS task to RW and does demonstrate step through gait ~180' with RW and supervision. Pt does require x3 standing rest breaks during bout but demonstrates correct use of RW with STS and ambulation. Education provided on pacing herself in home environment due to need for standing rest breaks and requiring use of RW to assist in offloading LLE. PT voiced concerns about considering STR if pt thinks she will have difficulty with pain management as pt lives alone and if she is unable to  perform functional mobility she will have no assist and would be at high risk of falls. Concerns voiced to hospitalist and hospitalist has similar concerns about falls risk but pt adamant despite education on returning home. Pt does have poor use of sequencing RW when returning to sitting but displayed ability to amb ~2' without AD to EOB with good stability and SUE support on bed. All  needs in reach at EOB. Unable to further justify STR based off of today's performance. Plan to recommend [facility] PT to improve balance and safety with functional mobility  with use of LRAD.   Recommendations for follow up therapy are one component of a multi-disciplinary discharge planning process, led by the attending physician.  Recommendations may be updated based on patient status, additional functional criteria and insurance authorization.  Follow Up Recommendations  Home health PT     Assistance Recommended at Discharge Intermittent Supervision/Assistance  Patient can return home with the following A little help with walking and/or transfers;Assist for transportation   Equipment Recommendations  Rolling walker (2 wheels)    Recommendations for Other Services       Precautions / Restrictions Precautions Precautions: Fall Restrictions Weight Bearing Restrictions: Yes LLE Weight Bearing: Weight bearing as tolerated     Mobility  Bed Mobility               General bed mobility comments: NT. Seated EOB before and after session. Patient Response: Cooperative, Flat affect  Transfers Overall transfer level: Modified independent Equipment used: Rolling walker (2 wheels) Transfers: Sit to/from Stand Sit to Stand: Modified independent (Device/Increase time)           General transfer comment: Safe use of hands to RW    Ambulation/Gait Ambulation/Gait assistance: Supervision Gait Distance (Feet): 180 Feet Assistive device: Rolling walker (2 wheels) Gait Pattern/deviations: Step-through pattern       General Gait Details: Pt safely ambulating ~180'  with step through pattern. Does require x3 standing rest breaks due to muscular fatigue in L quad but maintains balance and safe use of RW.   Stairs             Wheelchair Mobility    Modified Rankin (Stroke Patients Only)       Balance Overall balance assessment: Needs assistance Sitting-balance support:  Feet supported Sitting balance-Leahy Scale: Good     Standing balance support: Reliant on assistive device for balance, During functional activity Standing balance-Leahy Scale: Fair                              Cognition Arousal/Alertness: Awake/alert Behavior During Therapy: Flat affect Overall Cognitive Status: Within Functional Limits for tasks assessed                                          Exercises Other Exercises Other Exercises: Use of RW to offload pain in LLE during gait and stainding ADL's.    General Comments        Pertinent Vitals/Pain      Home Living                          Prior Function            PT Goals (current goals can now be found in the care plan section) Acute Rehab PT Goals Patient Stated Goal: decreased pain PT Goal Formulation: With patient Time For Goal Achievement: 02/26/22 Potential to Achieve Goals: Good Progress towards PT goals: Progressing toward goals    Frequency    Min 2X/week      PT Plan Current plan remains appropriate    Co-evaluation              AM-PAC PT "6 Clicks" Mobility   Outcome Measure  Help needed turning from your back to your side while in a flat bed without using bedrails?: A Little Help needed moving from lying on your back to sitting on the side of a flat bed without using bedrails?: A Little Help needed moving to and from a bed to a chair (including a wheelchair)?: A Little Help needed standing up from a chair using your arms (e.g., wheelchair or bedside chair)?: None Help needed to walk in hospital room?: None Help needed climbing 3-5 steps with a railing? : A Lot 6 Click Score: 19    End of Session Equipment Utilized During Treatment: Gait belt Activity Tolerance: Patient tolerated treatment well Patient left: in bed;with call bell/phone within reach Nurse Communication: Mobility status PT Visit Diagnosis: Difficulty in walking, not  elsewhere classified (R26.2);Muscle weakness (generalized) (M62.81);Pain Pain - Right/Left: Left Pain - part of body: Leg     Time: 1130-1144 PT Time Calculation (min) (ACUTE ONLY): 14 min  Charges:  $Therapeutic Exercise: 8-22 mins                    Salem Caster. Fairly IV, PT, DPT Physical Therapist- [facility]  02/16/2022, 12:19 PM

## 2022-02-16 NOTE — Progress Notes (Signed)
[facility]  ROUNDING NOTE   Subjective:   Terry Arroyo is a 61 year old female with past medical history including type 2 diabetes with polyneuropathy, depression, angioedema, heart failure, hypertension, and end-stage renal disease on dialysis.  Patient presents to the emergency department with extreme left thigh pain.  She will be admitted for ESRD (end stage renal disease) ([place]) [N18.6] Left leg cellulitis [L03.116] Abscess of left thigh [L02.416] Acute pain of left thigh [M79.652]  Patient is known to our practice and receives outpatient dialysis treatments at [facility] on a MWF schedule, supervised by Dr. Juleen China.  Last treatment received on Monday.   Patient seen sitting up in chair Currently upset at the d/c plan to include rehab. Ambulate with walker, touch down weight baring on left leg Edema remains in left thigh   Objective:  Vital signs in last 24 hours:  Temp:  [98.6 F (37 C)-99.3 F (37.4 C)] 99.3 F (37.4 C) (02/28 0507) Pulse Rate:  [63-64] 64 (02/28 0507) Resp:  [18-20] 18 (02/28 0507) BP: (136-148)/(63-72) 148/72 (02/28 0507) SpO2:  [94 %-95 %] 94 % (02/28 0507)  Weight change:  Filed Weights   02/12/22 0900 02/12/22 1210 02/15/22 1015  Weight: 79.9 kg 78.5 kg 73.6 kg    Intake/Output: I/O last 3 completed shifts: In: 2173.3 [P.O.:240; IV Piggyback:1933.3] Out: 1503 [JJOAC:1660]   Intake/Output this shift:  Total I/O In: 240 [P.O.:240] Out: -   Physical Exam: General: NAD  Head: Normocephalic, atraumatic. Moist oral mucosal membranes  Eyes: Anicteric  Lungs:  Clear to auscultation, normal effort, room air  Heart: Regular rate and rhythm  Abdomen:  Soft, nontender, nondistended  Extremities: Left thigh edema  Neurologic: Alert, moving all four extremities  Skin: Left medial thigh erythema  Access: Right aVF    Basic Metabolic Panel: Recent Labs  Lab 02/09/22 1716 02/11/22 0403 02/12/22 0422 02/14/22 0343  02/15/22 1035  NA 136 135 135 133* 133*  K 4.5 4.1 4.1 4.2 4.5  CL 94* 93* 94* 93* 94*  CO2 27 26 26 23 23   GLUCOSE 104* 132* 134* 82 98  BUN 33* 27* 42* 36* <5*  CREATININE 6.18* 5.47* 6.77* 6.66* 8.24*  CALCIUM 8.8* 8.1* 8.4* 8.4* 8.9  PHOS  --   --   --   --  5.4*     Liver Function Tests: Recent Labs  Lab 02/09/22 1716 02/15/22 1035  AST 20  --   ALT 12  --   ALKPHOS 78  --   BILITOT 1.7*  --   PROT 8.1  --   ALBUMIN 3.7 2.8*    Recent Labs  Lab 02/09/22 1716  LIPASE 25    No results for input(s): AMMONIA in the last 168 hours.  CBC: Recent Labs  Lab 02/09/22 1716 02/11/22 0403 02/12/22 0422 02/14/22 0343 02/15/22 1035  WBC 8.9 8.9 7.2 6.7 8.6  NEUTROABS  --  6.9 5.1 4.6  --   HGB 12.5 10.9* 11.3* 10.0* 10.4*  HCT 40.6 35.1* 36.0 31.8* 33.1*  MCV 91.2 89.8 89.3 89.3 88.5  PLT 279 240 245 217 247     Cardiac Enzymes: No results for input(s): CKTOTAL, CKMB, CKMBINDEX, TROPONINI in the last 168 hours.  BNP: Invalid input(s): POCBNP  CBG: Recent Labs  Lab 02/15/22 0759 02/15/22 1629 02/15/22 2201 02/16/22 0811 02/16/22 1152  GLUCAP 76 134* 89 136* 104*     Microbiology: Results for orders placed or performed during the hospital encounter of 02/09/22  MRSA Next Gen by PCR, Nasal     Status: None   Collection Time: 02/10/22  3:00 PM   Specimen: Nasal Mucosa; Nasal Swab  Result Value Ref Range Status   MRSA by PCR Next Gen NOT DETECTED NOT DETECTED Final    Comment: (NOTE) The GeneXpert MRSA Assay (FDA approved for NASAL specimens only), is one component of a comprehensive MRSA colonization surveillance program. It is not intended to diagnose MRSA infection nor to guide or monitor treatment for MRSA infections. Test performance is not FDA approved in patients less than 62 years old. Performed at [facility], [address] [place], [place] [address]   Resp Panel by RT-PCR (Flu A&B, Covid) Nasal Mucosa     Status: None    Collection Time: 02/10/22  3:00 PM   Specimen: Nasal Mucosa; Nasopharyngeal(NP) swabs in vial transport medium  Result Value Ref Range Status   SARS Coronavirus 2 by RT PCR NEGATIVE NEGATIVE Final    Comment: (NOTE) SARS-CoV-2 target nucleic acids are NOT DETECTED.  The SARS-CoV-2 RNA is generally detectable in upper respiratory specimens during the acute phase of infection. The lowest concentration of SARS-CoV-2 viral copies this assay can detect is 138 copies/mL. A negative result does not preclude SARS-Cov-2 infection and should not be used as the sole basis for treatment or other patient management decisions. A negative result may occur with  improper specimen collection/handling, submission of specimen other than nasopharyngeal swab, presence of viral mutation(s) within the areas targeted by this assay, and inadequate number of viral copies(<138 copies/mL). A negative result must be combined with clinical observations, patient history, and epidemiological information. The expected result is Negative.  Fact Sheet for Patients:  EntrepreneurPulse.com.au  Fact Sheet for Healthcare Providers:  IncredibleEmployment.be  This test is no t yet approved or cleared by the [place] FDA and  has been authorized for detection and/or diagnosis of SARS-CoV-2 by FDA under an Emergency Use Authorization (EUA). This EUA will remain  in effect (meaning this test can be used) for the duration of the COVID-19 declaration under Section 564(b)(1) of the Act, 21 U.S.C.section 360bbb-3(b)(1), unless the authorization is terminated  or revoked sooner.       Influenza A by PCR NEGATIVE NEGATIVE Final   Influenza B by PCR NEGATIVE NEGATIVE Final    Comment: (NOTE) The Xpert Xpress SARS-CoV-2/FLU/RSV plus assay is intended as an aid in the diagnosis of influenza from Nasopharyngeal swab specimens and should not be used as a sole basis for treatment. Nasal  washings and aspirates are unacceptable for Xpert Xpress SARS-CoV-2/FLU/RSV testing.  Fact Sheet for Patients: EntrepreneurPulse.com.au  Fact Sheet for Healthcare Providers: IncredibleEmployment.be  This test is not yet approved or cleared by the [place] FDA and has been authorized for detection and/or diagnosis of SARS-CoV-2 by FDA under an Emergency Use Authorization (EUA). This EUA will remain in effect (meaning this test can be used) for the duration of the COVID-19 declaration under Section 564(b)(1) of the Act, 21 U.S.C. section 360bbb-3(b)(1), unless the authorization is terminated or revoked.  Performed at [facility], [address] [place], [place] [address]   Culture, blood (routine x 2)     Status: None   Collection Time: 02/10/22  8:40 PM   Specimen: BLOOD  Result Value Ref Range Status   Specimen Description BLOOD LEFT HAND  Final   Special Requests   Final    BOTTLES DRAWN AEROBIC AND ANAEROBIC Blood Culture adequate volume   Culture  Final    NO GROWTH 5 DAYS Performed at [facility], [address] [place], [place] [address]    Report Status 02/15/2022 FINAL  Final  Culture, blood (routine x 2)     Status: None   Collection Time: 02/10/22 10:05 PM   Specimen: BLOOD  Result Value Ref Range Status   Specimen Description BLOOD LEFT WRIST  Final   Special Requests   Final    BOTTLES DRAWN AEROBIC AND ANAEROBIC Blood Culture adequate volume   Culture   Final    NO GROWTH 5 DAYS Performed at [facility], [address] [place], [place] [address]    Report Status 02/15/2022 FINAL  Final  Resp Panel by RT-PCR (Flu A&B, Covid) Nasopharyngeal Swab     Status: None   Collection Time: 02/15/22  3:31 PM   Specimen: Nasopharyngeal Swab; Nasopharyngeal(NP) swabs in vial transport medium  Result Value Ref Range Status   SARS Coronavirus 2 by RT PCR NEGATIVE NEGATIVE Final    Comment:  (NOTE) SARS-CoV-2 target nucleic acids are NOT DETECTED.  The SARS-CoV-2 RNA is generally detectable in upper respiratory specimens during the acute phase of infection. The lowest concentration of SARS-CoV-2 viral copies this assay can detect is 138 copies/mL. A negative result does not preclude SARS-Cov-2 infection and should not be used as the sole basis for treatment or other patient management decisions. A negative result may occur with  improper specimen collection/handling, submission of specimen other than nasopharyngeal swab, presence of viral mutation(s) within the areas targeted by this assay, and inadequate number of viral copies(<138 copies/mL). A negative result must be combined with clinical observations, patient history, and epidemiological information. The expected result is Negative.  Fact Sheet for Patients:  EntrepreneurPulse.com.au  Fact Sheet for Healthcare Providers:  IncredibleEmployment.be  This test is no t yet approved or cleared by the [place] FDA and  has been authorized for detection and/or diagnosis of SARS-CoV-2 by FDA under an Emergency Use Authorization (EUA). This EUA will remain  in effect (meaning this test can be used) for the duration of the COVID-19 declaration under Section 564(b)(1) of the Act, 21 U.S.C.section 360bbb-3(b)(1), unless the authorization is terminated  or revoked sooner.       Influenza A by PCR NEGATIVE NEGATIVE Final   Influenza B by PCR NEGATIVE NEGATIVE Final    Comment: (NOTE) The Xpert Xpress SARS-CoV-2/FLU/RSV plus assay is intended as an aid in the diagnosis of influenza from Nasopharyngeal swab specimens and should not be used as a sole basis for treatment. Nasal washings and aspirates are unacceptable for Xpert Xpress SARS-CoV-2/FLU/RSV testing.  Fact Sheet for Patients: EntrepreneurPulse.com.au  Fact Sheet for Healthcare  Providers: IncredibleEmployment.be  This test is not yet approved or cleared by the [place] FDA and has been authorized for detection and/or diagnosis of SARS-CoV-2 by FDA under an Emergency Use Authorization (EUA). This EUA will remain in effect (meaning this test can be used) for the duration of the COVID-19 declaration under Section 564(b)(1) of the Act, 21 U.S.C. section 360bbb-3(b)(1), unless the authorization is terminated or revoked.  Performed at [facility], [address] [place], [place] [address]     Coagulation Studies: No results for input(s): LABPROT, INR in the last 72 hours.  Urinalysis: No results for input(s): COLORURINE, LABSPEC, PHURINE, GLUCOSEU, HGBUR, BILIRUBINUR, KETONESUR, PROTEINUR, UROBILINOGEN, NITRITE, LEUKOCYTESUR in the last 72 hours.  Invalid input(s): APPERANCEUR    Imaging: No results found.   Medications:  methocarbamol (ROBAXIN) IV      amLODipine  10 mg Oral Daily   amoxicillin-clavulanate  500 mg Oral Q12H   aspirin EC  81 mg Oral Daily   atorvastatin  80 mg Oral q1800   carvedilol  25 mg Oral BID   Chlorhexidine Gluconate Cloth  6 each Topical Q0600   furosemide  80 mg Oral Once per day on Sun Tue Thu Sat   heparin  5,000 Units Subcutaneous Q8H   insulin aspart  0-15 Units Subcutaneous TID WC   isosorbide mononitrate  30 mg Oral Daily   LORazepam  0.5 mg Oral Once   mirtazapine  30 mg Oral QHS   pantoprazole  40 mg Oral Daily   polyethylene glycol  17 g Oral Daily   senna  1 tablet Oral BID   sucralfate  1 g Oral BID AC   topiramate  25 mg Oral Daily   acetaminophen **OR** acetaminophen, HYDROcodone-acetaminophen, melatonin, methocarbamol (ROBAXIN) IV, morphine injection, naLOXone (NARCAN)  injection, ondansetron **OR** ondansetron (ZOFRAN) IV  Assessment/ Plan:  [name] is a 61 y.o.  female past medical history including type 2 diabetes with polyneuropathy, depression,  angioedema, heart failure, hypertension, and end-stage renal disease on dialysis.  Patient presents to the emergency department with extreme left thigh pain.  She will be admitted for ESRD (end stage renal disease) ([place]) [N18.6] Left leg cellulitis [L03.116] Abscess of left thigh [L02.416] Acute pain of left thigh [M79.652]  [facility]/MWF/right aVF/76kg  End-stage renal disease on hemodialysis.   Dialysis yesterday with 1.5L achieved. Next treatment scheduled for Wednesday  2. Anemia of chronic kidney disease Normocytic Lab Results  Component Value Date   HGB 10.4 (L) 02/15/2022  Mircera ordered outpatient   3. Secondary Hyperparathyroidism:  Lab Results  Component Value Date   CALCIUM 8.9 02/15/2022   CAION 1.09 (L) 05/13/2021   PHOS 5.4 (H) 02/15/2022    Calcium and phosphorus at desired target  4.  Left leg cellulitis with collection within the vastus medialis muscle concerning for hematoma or abscess.  Recommend consulting orthopedics for possible aspiration.  Continue pain management and prescribed empiric antibiotic therapy. Will discharge on Augmentin.     LOS: [place] 2/28/20232:00 PM

## 2022-02-16 NOTE — Discharge Summary (Addendum)
Physician Discharge Summary  Terry Arroyo KZL:[id number] DOB: [birth year] DOA: 02/09/2022  PCP: Kirk Ruths, MD  Admit date: 02/09/2022 Discharge date: 02/16/2022  Admitted From: Home Disposition:  Home  Discharge Condition:Stable CODE STATUS:FULL Diet recommendation: renal   Brief/Interim Summary: Patient is a 61 year old female with history of ESRD on dialysis on Monday, Wednesday, Friday, systolic congestive heart failure, hypertension, diabetes type 2, depression, COPD who presented with pain on the left thigh.  On presentation she was hypertensive.  CT left femur did not show any acute abnormality.  Also noted to have lower extremity edema.  Presentation was consistent with cellulitis of the left thigh and was started on antibiotics.  MRI showed diffuse edema primarily involving the anterior compartment musculature of the left thigh,  2.9 x 1.9 cm focal collection within the vastus medialis which could be an intramuscular hematoma or abscess.  Orthopedics consulted ,IR consulted for possible drainage.  Ultrasound of the thigh did not show any focal fluid collection so procedure not done.  Orthopedics did not recommend any surgical intervention.  Left thigh pain and edema improved with  IV antibiotics.  PT consulted ,recommended SNF on DC but she she declined and wants to go home with home health.  Antibiotics changed to oral today before discharge.    Following problems were addressed during her hospitalization:  Acute pain of left thigh- (present on admission) Seen by vascular with low suspicion for vascular etiology.  Negative for DVT on 2/14.  Venous Doppler on 2/21 showed grossly patent deep veins, short segment of nonocclusive thrombus/thrombophlebitis within the greater saphenous vein. MRI showed MRI showed diffuse edema primarily involving the anterior compartment musculature of the left thigh,  2.9 x 1.9 cm focal collection within the vastus medialis which could be an  intramuscular hematoma or abscess.  Orthopedics /IR consulted, ultrasound did not show any abscess or collection so procedure canceled. Continue broad spectrum abx.F/U cultures,NGTD.  Left thigh edema, tenderness has significantly improved but she was still having pain,so  she underwent CT left thigh with contrast we did not show any collection/abscess but showed features of myositis/cellulitis.  Orthopedics recommending to continue antibiotics, no plan for surgical intervention.  Continue pain medication, muscle relaxant at home.  Pain expected to continue for prolonged time.  She needs to work with physical therapy.  Antibiotics changed to oral.   Physical debility Patient seen by PT and recommended skilled nursing facility on discharge   Cellulitis of left thigh CT showed no acute bony injury.MRI showed possible abscess versus hematoma. [place] did not show any fluid collection.  CT showed features of myositis/cellulitis   ESRD on dialysis [facility]) Nephrology was following for continuation of dialysis   CAD (coronary artery disease)- (present on admission) Patient has history of CABG Continue aspirin, atorvastatin, carvedilol and Imdur   HFrEF (heart failure with reduced ejection fraction) ([place])- (present on admission) Currently euvolemic Continue furosemide, carvedilol, Imdur   Essential hypertension- (present on admission) Continue amlodipine and carvedilol   Type 2 diabetes mellitus with hyperlipidemia ([place])- (present on admission) Continue home regimen    Discharge Diagnoses:  Principal Problem:   Acute pain of left thigh Active Problems:   Type 2 diabetes mellitus with hyperlipidemia ([facility])   Essential hypertension   HFrEF (heart failure with reduced ejection fraction) (H[facility])   CAD (coronary artery disease)   ESRD on dialysis ([place])   Cellulitis of left thigh   Hx of CABG   Physical debility    Discharge Instructions  Discharge  Instructions     Diet - low sodium heart  healthy   Complete by: As directed    Discharge instructions   Complete by: As directed    1)Please take prescribed medications as instructed 2)Follow up with home physical therapy 3)Follow up with your PCP in a week.   Increase activity slowly   Complete by: As directed       Allergies as of 02/16/2022       Reactions   Hydralazine Hives, Shortness Of Breath, Swelling, Rash   Body aches   Kiwi Extract Hives, Swelling, Rash   Mouth swells    Lisinopril Swelling   Angioedema   Shellfish Allergy Anaphylaxis   Shrimp/lobster  Betadine leaves welts on skin    Betadine [povidone Iodine] Hives, Rash   LEAVES WELTS ON SKIN   Metformin Diarrhea, Other (See Comments)   GI Intolerance   Tape Itching   Use paper tape whenever possible        Medication List     STOP taking these medications    oxyCODONE 5 MG immediate release tablet Commonly known as: Oxy IR/ROXICODONE       TAKE these medications    acetaminophen 500 MG tablet Commonly known as: TYLENOL Take 1,000 mg by mouth every 6 (six) hours as needed for mild pain or moderate pain.   albuterol 108 (90 Base) MCG/ACT inhaler Commonly known as: VENTOLIN HFA Inhale 2 puffs into the lungs every 6 (six) hours as needed for wheezing or shortness of breath.   amLODipine 10 MG tablet Commonly known as: NORVASC Take 1 tablet (10 mg total) by mouth daily.   ammonium lactate 12 % lotion Commonly known as: LAC-HYDRIN Apply 1 application topically every Monday, Wednesday, and Friday.   amoxicillin-clavulanate 500-125 MG tablet Commonly known as: AUGMENTIN Take 1 tablet (500 mg total) by mouth every 12 (twelve) hours for 17 doses.   aspirin 81 MG EC tablet Take 1 tablet (81 mg total) by mouth daily.   atorvastatin 80 MG tablet Commonly known as: LIPITOR Take 1 tablet (80 mg total) by mouth daily at 6 PM.   carvedilol 25 MG tablet Commonly known as: COREG Take 25 mg by mouth 2 (two) times daily.   furosemide 80  MG tablet Commonly known as: LASIX Take 80 mg by mouth 4 (four) times a week. Tues, Thurs, Sat, and Sun (non-dialysis days)   gabapentin 100 MG capsule Commonly known as: NEURONTIN Take 100 mg by mouth daily as needed (pain).   HYDROcodone-acetaminophen 10-325 MG tablet Commonly known as: NORCO Take 1 tablet by mouth every 6 (six) hours as needed for moderate pain.   insulin aspart protamine- aspart (70-30) 100 UNIT/ML injection Commonly known as: NOVOLOG MIX 70/30 Inject 0.15 mLs (15 Units total) into the skin 2 (two) times daily with a meal. What changed: when to take this   isosorbide mononitrate 30 MG 24 hr tablet Commonly known as: IMDUR Take 30 mg by mouth daily.   lidocaine-prilocaine cream Commonly known as: EMLA Apply 1 application topically as needed (fistula access).   methocarbamol 500 MG tablet Commonly known as: Robaxin Take 1 tablet (500 mg total) by mouth every 8 (eight) hours as needed for muscle spasms.   mirtazapine 30 MG tablet Commonly known as: REMERON Take 30 mg by mouth at bedtime.   multivitamin Tabs tablet Take 1 tablet by mouth every Monday, Wednesday, and Friday.   omeprazole 40 MG capsule Commonly known as: PRILOSEC Take 40 mg by mouth  daily.   polyethylene glycol 17 g packet Commonly known as: MIRALAX / GLYCOLAX Take 17 g by mouth daily as needed. What changed:  when to take this reasons to take this   sucralfate 1 g tablet Commonly known as: CARAFATE Take 1 g by mouth 2 (two) times daily before a meal.   topiramate 25 MG tablet Commonly known as: TOPAMAX Take 25 mg by mouth daily.   traZODone 50 MG tablet Commonly known as: DESYREL Take 50 mg by mouth at bedtime.   True Metrix Blood Glucose Test test strip Generic drug: glucose blood   True Metrix Meter w/Device Kit   vitamin B-12 1000 MCG tablet Commonly known as: CYANOCOBALAMIN Take 1,000 mcg by mouth daily.               Durable Medical Equipment  (From  admission, onward)           Start     Ordered   02/11/22 1408  For home use only DME Walker rolling  Once       Question Answer Comment  Walker: With 5 Inch Wheels   Patient needs a walker to treat with the following condition Weakness      02/11/22 1407            Contact information for follow-up providers     Kirk Ruths, MD. Schedule an appointment as soon as possible for a visit in 1 week(s).   Specialty: Internal Medicine Contact information: [place] [address] [phone]              Contact information for after-discharge care     Destination     [facility] .   Service: Skilled Nursing Contact information: [address] [place] [address] [phone]                    Allergies  Allergen Reactions   Hydralazine Hives, Shortness Of Breath, Swelling and Rash    Body aches    Kiwi Extract Hives, Swelling and Rash    Mouth swells    Lisinopril Swelling    Angioedema   Shellfish Allergy Anaphylaxis    Shrimp/lobster  Betadine leaves welts on skin     Betadine [Povidone Iodine] Hives and Rash    LEAVES WELTS ON SKIN   Metformin Diarrhea and Other (See Comments)    GI Intolerance   Tape Itching    Use paper tape whenever possible    Consultations: Orthopedics, nephrology, IR   Procedures/Studies: CT FEMUR LEFT W CONTRAST  Result Date: 02/12/2022 CLINICAL DATA:  Soft tissue infection suspected. EXAM: CT OF THE LOWER RIGHT EXTREMITY WITH CONTRAST TECHNIQUE: Multidetector CT imaging of the lower right extremity was performed according to the standard protocol following intravenous contrast administration. RADIATION DOSE REDUCTION: This exam was performed according to the departmental dose-optimization program which includes automated exposure control, adjustment of the mA and/or kV according to patient size and/or use of  iterative reconstruction technique. CONTRAST:  128m OMNIPAQUE IOHEXOL 300 MG/ML  SOLN COMPARISON:  MRI 02/10/2022 FINDINGS: Bones/Joint/Cartilage Grossly unremarkable Ligaments Suboptimally assessed by CT. Muscles and Tendons Edema again noted surrounding the anterior compartment musculature as seen on recent MRI. The previously seen focal fluid collection within the vastus medialis not appreciated by CT. Soft tissues Subcutaneous edema throughout the upper thighs circumferentially and medially in the mid to lower thigh. IMPRESSION: Edema primarily involving the  anterior compartment musculature as seen on recent MRI most compatible with myositis. The previously described small vastus medialis fluid collection not appreciated by CT. This could be further evaluated and possibly better seen with MRI. Overlying subcutaneous edema could reflect cellulitis. Electronically Signed   By: Rolm Baptise M.D.   On: 02/12/2022 20:38   CT FEMUR LEFT WO CONTRAST  Result Date: 02/09/2022 CLINICAL DATA:  Left leg pain and hip pain, initial encounter EXAM: CT OF THE LOWER LEFT EXTREMITY WITHOUT CONTRAST TECHNIQUE: Multidetector CT imaging of the lower left extremity was performed according to the standard protocol. RADIATION DOSE REDUCTION: This exam was performed according to the departmental dose-optimization program which includes automated exposure control, adjustment of the mA and/or kV according to patient size and/or use of iterative reconstruction technique. COMPARISON:  Plain film from earlier the same day FINDINGS: Bones/Joint/Cartilage Degenerative changes of the left sacroiliac joint are noted. No acute fracture or dislocation is noted pelvic bony structures appear within normal limits. Ligaments Suboptimally assessed by CT. Muscles and Tendons Surrounding musculature appears within normal limits. Soft tissues Lower extremity edema is noted throughout the thigh particularly in the mid and distal thigh. No focal  hematoma is seen. Atherosclerotic calcifications are noted. IMPRESSION: No acute bony abnormality is noted. Lower extremity edema is noted. Electronically Signed   By: Inez Catalina M.D.   On: 02/09/2022 21:19   MR FEMUR LEFT WO CONTRAST  Result Date: 02/10/2022 CLINICAL DATA:  Left lower extremity swelling and pain. EXAM: MR OF THE LEFT FEMUR WITHOUT CONTRAST TECHNIQUE: Multiplanar, multisequence MR imaging of the left thigh was performed. No intravenous contrast was administered. COMPARISON:  Left femoral series earlier today, left hip series 02/02/2022 FINDINGS: Bones/Joint/Cartilage There is no evidence of fractures. Partial joint space loss is again noted at the hips symmetrically. There is mild symmetric nonerosive degenerative arthrosis at the knees, with symmetric small suprapatellar bursal effusions. There are small amounts of fluid symmetrically in the hip joint spaces possibly physiologic or could be due to minimal symmetric joint effusions. Ligaments Not well evaluated given nondedicated technique. Grossly intact as far seen. There is 1 cm of lateral patellar drift on the left which could indicate medial patellofemoral ligament laxity, but the ligament itself is intact. Muscles and Tendons, soft tissues There is diffuse edema primarily in the anterior compartment musculature, most notably in the gracilis muscle, vastus medialis, adductor longus, the sartorius muscle, and the medial aspect of the vastus intermedius. There is additional edema in the overlying subcutaneous plane anteriorly and medially, and scattered fluid collecting along the fat muscle interfaces in the thigh laterally, medially and anteriorly. There is fluid extending along the intermuscular septa as well, primarily in the anterior compartment. There is a small intramuscular collection in the vastus medialis measuring 2.9 x 1.9 cm with peripheral low signal intensity and small areas of signal dropout within it, which could be a  hematoma or abscess. No other focal fluid collections are observed. Superficial edema continues into the foreleg without further intramuscular edema. Visualized portions of the left hemipelvis are unremarkable. IMPRESSION: 1. No evidence of fractures. 2. Diffuse edema primarily involving the anterior compartment musculature of the left thigh, with overlying edema in the subcutaneous plane and nonlocalizing fluid along fat muscle interfaces and intermuscular septa. 3. There is a 2.9 x 1.9 cm focal collection within the vastus medialis which could be an intramuscular hematoma or abscess. 4. The primary differential considerations would be an infectious process with multifocal myositis/pyomyositis with overlying cellulitis,  or edema due to a traumatic etiology. Certainly a necrotizing fasciitis/compartment syndrome is not excluded. 5. Mild degenerative changes of the knee joints and hip joints, with small amounts of symmetric bilateral uncomplicated hip joint and suprapatellar bursal fluid. Electronically Signed   By: Telford Nab M.D.   On: 02/10/2022 00:56   US Venous Img Lower Unilateral Left  Result Date: 02/09/2022 CLINICAL DATA:  Left leg pain EXAM: Left LOWER EXTREMITY VENOUS DOPPLER ULTRASOUND TECHNIQUE: Gray-scale sonography with compression, as well as color and duplex ultrasound, were performed to evaluate the deep venous system(s) from the level of the common femoral vein through the popliteal and proximal calf veins. COMPARISON:  02/02/2022 FINDINGS: VENOUS Patient would not allow compression views within the upper leg due to pain. Normal compressibility of the popliteal veins, as well as the visualized calf veins. Visualized portions of profunda femoral vein. Suspicion of nonocclusive thrombus within the greater saphenous vein in the upper thigh near the saphenofemoral junction. Doppler waveforms show normal direction of venous flow, normal respiratory plasticity and response to augmentation.  Limited views of the contralateral common femoral vein are unremarkable. OTHER Small fluid collection in the popliteal fossa measuring 2 cm. Limitations: Unable to perform compression views from the hip to the knee due to pain IMPRESSION: 1. The examination was limited by inability to perform compression views from the hip to the knee secondary to severe pain. Deep veins appear grossly patent on color flow images with normal waveforms. 2. Short segment of nonocclusive thrombus/thrombophlebitis within the greater saphenous vein in the upper thigh near the saphenofemoral junction. 3. 2 cm fluid collection in the popliteal fossa Electronically Signed   By: Donavan Foil M.D.   On: 02/09/2022 23:28   US Venous Img Lower Unilateral Left  Result Date: 02/02/2022 CLINICAL DATA:  Left lower extremity pain and swelling. EXAM: Left LOWER EXTREMITY VENOUS DOPPLER ULTRASOUND TECHNIQUE: Gray-scale sonography with compression, as well as color and duplex ultrasound, were performed to evaluate the deep venous system(s) from the level of the common femoral vein through the popliteal and proximal calf veins. COMPARISON:  May 01, 2020. FINDINGS: VENOUS Normal compressibility of the common femoral, superficial femoral, and popliteal veins, as well as the visualized calf veins. Visualized portions of profunda femoral vein and great saphenous vein unremarkable. No filling defects to suggest DVT on grayscale or color Doppler imaging. Doppler waveforms show normal direction of venous flow, normal respiratory plasticity and response to augmentation. Limited views of the contralateral common femoral vein are unremarkable. OTHER Probable Baker's cyst seen in left popliteal fossa. Limitations: none IMPRESSION: No evidence of deep venous thrombosis seen in left lower extremity Electronically Signed   By: Marijo Conception M.D.   On: 02/02/2022 13:48   VAS [place] ABI WITH/WO TBI  Result Date: 02/04/2022  LOWER EXTREMITY DOPPLER STUDY Patient  Name:  FREDRICK DRAY  Date of Exam:   02/04/2022 Medical Rec #: [id number]        Accession #:    [id number] Date of Birth: [birth year]         Patient Gender: F Patient Age:   5 years Exam Location:  [facility] Procedure:      VAS [place] ABI WITH/WO TBI Referring Phys: [name] --------------------------------------------------------------------------------  Indications: Rest pain.  Comparison Study: 11/03/2021 Performing Technologist: Almira Coaster RVS  Examination Guidelines: A complete evaluation includes at minimum, Doppler waveform signals and systolic blood pressure reading at the level of bilateral brachial, anterior tibial, and posterior  tibial arteries, when vessel segments are accessible. Bilateral testing is considered an integral part of a complete examination. Photoelectric Plethysmograph (PPG) waveforms and toe systolic pressure readings are included as required and additional duplex testing as needed. Limited examinations for reoccurring indications may be performed as noted.  ABI Findings: +---------+------------------+-----+---------+--------+  Right     Rt Pressure (mmHg) Index Waveform  Comment   +---------+------------------+-----+---------+--------+  Brachial                                     HDA       +---------+------------------+-----+---------+--------+  ATA       163                1.06  biphasic            +---------+------------------+-----+---------+--------+  PTA       158                1.03  triphasic           +---------+------------------+-----+---------+--------+  Great Toe 146                0.95  Normal              +---------+------------------+-----+---------+--------+ +---------+------------------+-----+---------+-------+  Left      Lt Pressure (mmHg) Index Waveform  Comment  +---------+------------------+-----+---------+-------+  Brachial  154                                         +---------+------------------+-----+---------+-------+  ATA       139                 0.90  triphasic          +---------+------------------+-----+---------+-------+  PTA       150                0.97  biphasic           +---------+------------------+-----+---------+-------+  Great Toe 128                0.83  Normal             +---------+------------------+-----+---------+-------+ +-------+-----------+-----------+------------+------------+  ABI/TBI Today's ABI Today's TBI Previous ABI Previous TBI  +-------+-----------+-----------+------------+------------+  Right   1.06        .95         1.11         .87           +-------+-----------+-----------+------------+------------+  Left    .97         .83         1.07         .83           +-------+-----------+-----------+------------+------------+ Bilateral ABIs appear essentially unchanged compared to prior study on 11/03/2021. Right TBIs appear increased compared to prior study on 11/03/2021. Lt TBIs appear essentially unchanged compared to prior study on 11/03/2021.  Summary: Right: Resting right ankle-brachial index is within normal range. No evidence of significant right lower extremity arterial disease. The right toe-brachial index is normal. Left: Resting left ankle-brachial index is within normal range. No evidence of significant left lower extremity arterial disease. The left toe-brachial index is normal.  *See table(s) above for measurements and observations.  Electronically signed by [name] MD on 02/04/2022 at 3:52:14 PM.  Final    [place] LT LOWER EXTREM LTD SOFT TISSUE NON VASCULAR  Result Date: 02/10/2022 CLINICAL DATA:  61 year old with left leg pain. Concern for a fluid collection in the left thigh vastus medialis. Evaluate for percutaneous aspiration. EXAM: ULTRASOUND LEFT LOWER EXTREMITY LIMITED TECHNIQUE: Ultrasound examination of the lower extremity soft tissues was performed in the area of clinical concern. COMPARISON:  MRI 02/09/2022 FINDINGS: The area of concern is the medial left thigh. The area of concern is  just medial and posterior to the left femoral vessels. No discrete fluid collection was identified in this area. It was very difficult to compress the soft tissues or the blood vessels in this area due to patient's discomfort. IMPRESSION: No discrete fluid collection identified at the area of concern in the medial left thigh. Aspiration not performed. Electronically Signed   By: Markus Daft M.D.   On: 02/10/2022 15:09   CT Renal Stone Study  Result Date: 02/09/2022 CLINICAL DATA:  Flank pain EXAM: CT ABDOMEN AND PELVIS WITHOUT CONTRAST TECHNIQUE: Multidetector CT imaging of the abdomen and pelvis was performed following the standard protocol without IV contrast. RADIATION DOSE REDUCTION: This exam was performed according to the departmental dose-optimization program which includes automated exposure control, adjustment of the mA and/or kV according to patient size and/or use of iterative reconstruction technique. COMPARISON:  CT 10/28/2021 FINDINGS: Lower chest: Lung bases show no acute consolidation or effusion. Cardiomegaly. Hepatobiliary: Distended gallbladder without calcified stone. No focal hepatic abnormality or biliary dilatation Pancreas: Unremarkable. No pancreatic ductal dilatation or surrounding inflammatory changes. Spleen: Normal in size without focal abnormality. Adrenals/Urinary Tract: Adrenal glands are normal. Probable cortical scarring within the bilateral kidneys. No hydronephrosis. Low-density renal lesions incompletely characterized without contrast. The bladder is unremarkable Stomach/Bowel: The stomach is nonenlarged. No dilated small bowel. No acute bowel wall thickening Vascular/Lymphatic: Advanced aortic atherosclerosis. No aneurysm. Mild retroperitoneal lymph nodes measuring up to 11 mm. Reproductive: Uterus unremarkable.  No adnexal mass. Other: Negative for pelvic effusion or free air. Musculoskeletal: No acute osseous abnormality. IMPRESSION: 1. Negative for hydronephrosis or  ureteral stone. Probable cortical scarring within the bilateral kidneys. 2. No CT evidence for acute intra-abdominopelvic abnormality. Electronically Signed   By: Donavan Foil M.D.   On: 02/09/2022 21:17   DG Hip Unilat W or Wo Pelvis 2-3 Views Left  Result Date: 02/02/2022 CLINICAL DATA:  History of nontraumatic hip pain by report. EXAM: DG HIP (WITH OR WITHOUT PELVIS) 2-3V LEFT COMPARISON:  CT abdomen and pelvis from 2022. FINDINGS: Degenerative changes are noted about the bilateral hips. LEFT hip is located without signs of acute process. Soft tissues are grossly unremarkable about the pelvis and LEFT hip. No sign of pelvic fracture or acute bony abnormality. IMPRESSION: Degenerative changes about the bilateral hips. No acute findings. Electronically Signed   By: Zetta Bills M.D.   On: 02/02/2022 14:38   DG Femur Min 2 Views Left  Result Date: 02/09/2022 CLINICAL DATA:  Left-sided leg pain EXAM: LEFT FEMUR 2 VIEWS COMPARISON:  None. FINDINGS: There is no evidence of fracture or other focal bone lesions. Soft tissues are unremarkable. Vascular calcifications. Clips at the medial lower leg. IMPRESSION: No acute osseous abnormality Electronically Signed   By: Donavan Foil M.D.   On: 02/09/2022 18:16   VAS US AORTA/IVC/ILIACS  Result Date: 02/04/2022 ABDOMINAL AORTA STUDY Patient Name:  CARYL FATE  Date of Exam:   02/04/2022 Medical Rec #: [id number]        Accession #:  [id number] Date of Birth: January 17, 1961         Patient Gender: F Patient Age:   89 years Exam Location:  [facility] Procedure:      VAS US AORTA/IVC/ILIACS Referring Phys: [name] --------------------------------------------------------------------------------  Indications: Left Leg Pain  Performing Technologist: Almira Coaster RVS  Examination Guidelines: A complete evaluation includes B-mode imaging, spectral Doppler, color Doppler, and power Doppler as needed of all accessible portions of each vessel.  Bilateral testing is considered an integral part of a complete examination. Limited examinations for reoccurring indications may be performed as noted.  Abdominal Aorta Findings: +-------------+-------+----------+----------+----------+--------+--------+  Location      AP (cm) Trans (cm) PSV (cm/s) Waveform   Thrombus Comments  +-------------+-------+----------+----------+----------+--------+--------+  Proximal      1.78    1.88       72         monophasic                    +-------------+-------+----------+----------+----------+--------+--------+  Mid           1.68    1.80       68         monophasic                    +-------------+-------+----------+----------+----------+--------+--------+  Distal        1.75    1.90       89         monophasic                    +-------------+-------+----------+----------+----------+--------+--------+  RT CIA Prox   1.6     1.7        78         biphasic                      +-------------+-------+----------+----------+----------+--------+--------+  RT CIA Distal 0.9     1.0        143        biphasic                      +-------------+-------+----------+----------+----------+--------+--------+  RT EIA Prox   0.8     0.9        165        biphasic                      +-------------+-------+----------+----------+----------+--------+--------+  RT EIA Distal 0.8     0.9        142        biphasic                      +-------------+-------+----------+----------+----------+--------+--------+  LT CIA Prox   1.1     1.6        58         monophasic                    +-------------+-------+----------+----------+----------+--------+--------+  LT CIA Distal 0.9     1.0        113        biphasic                      +-------------+-------+----------+----------+----------+--------+--------+  LT EIA Prox   0.9     0.9        118        biphasic                      +-------------+-------+----------+----------+----------+--------+--------+  LT EIA Distal 0.7     0.8        151         biphasic                      +-------------+-------+----------+----------+----------+--------+--------+  Summary: Abdominal Aorta: The largest aortic measurement is 1.9 cm. Stenosis: The Aorta CIA and EIA; appears to increased in the Left EIA.  *See table(s) above for measurements and observations.  Electronically signed by [name] MD on 02/04/2022 at 3:58:18 PM.    Final       Subjective: Patient seen and examined at the bedside this morning.  She looks comfortable today.  She was sitting in the chair.  Still concerned about pain on the left thigh.  We had a long discussion at the bedside, clearly explained that the management will be with antibiotics and pain medication and physical therapy.  I have recommended her to consider skilled nursing facility on discharge but she has declined.  Discharge Exam: Vitals:   02/15/22 1925 02/16/22 0507  BP: (!) 144/63 (!) 148/72  Pulse: 64 64  Resp: 18 18  Temp: 98.6 F (37 C) 99.3 F (37.4 C)  SpO2: 95% 94%   Vitals:   02/15/22 1345 02/15/22 1640 02/15/22 1925 02/16/22 0507  BP: 128/69 136/67 (!) 144/63 (!) 148/72  Pulse: 73 63 64 64  Resp: 13 20 18 18   Temp: 98 F (36.7 C) 98.7 F (37.1 C) 98.6 F (37 C) 99.3 F (37.4 C)  TempSrc: Oral Oral    SpO2: 96% 95% 95% 94%  Weight:      Height:        General: Pt is alert, awake, not in acute distress Cardiovascular: RRR, S1/S2 +, no rubs, no gallops Respiratory: CTA bilaterally, no wheezing, no rhonchi Abdominal: Soft, NT, ND, bowel sounds + Extremities: Mild edema of the left thigh, no cyanosis    The results of significant diagnostics from this hospitalization (including imaging, microbiology, ancillary and laboratory) are listed below for reference.     Microbiology: Recent Results (from the past 240 hour(s))  MRSA Next Gen by PCR, Nasal     Status: None   Collection Time: 02/10/22  3:00 PM   Specimen: Nasal Mucosa; Nasal Swab  Result Value Ref Range Status   MRSA by  PCR Next Gen NOT DETECTED NOT DETECTED Final    Comment: (NOTE) The GeneXpert MRSA Assay (FDA approved for NASAL specimens only), is one component of a comprehensive MRSA colonization surveillance program. It is not intended to diagnose MRSA infection nor to guide or monitor treatment for MRSA infections. Test performance is not FDA approved in patients less than 71 years old. Performed at [facility], [address] [place], [place] [address]   Resp Panel by RT-PCR (Flu A&B, Covid) Nasal Mucosa     Status: None   Collection Time: 02/10/22  3:00 PM   Specimen: Nasal Mucosa; Nasopharyngeal(NP) swabs in vial transport medium  Result Value Ref Range Status   SARS Coronavirus 2 by RT PCR NEGATIVE NEGATIVE Final    Comment: (NOTE) SARS-CoV-2 target nucleic acids are NOT DETECTED.  The SARS-CoV-2 RNA is generally detectable in upper respiratory specimens during the acute phase of infection. The lowest concentration of SARS-CoV-2 viral copies this assay can detect is 138 copies/mL. A negative result does not preclude SARS-Cov-2 infection and should not be used as the sole basis for treatment or other patient management decisions. A negative result  may occur with  improper specimen collection/handling, submission of specimen other than nasopharyngeal swab, presence of viral mutation(s) within the areas targeted by this assay, and inadequate number of viral copies(<138 copies/mL). A negative result must be combined with clinical observations, patient history, and epidemiological information. The expected result is Negative.  Fact Sheet for Patients:  EntrepreneurPulse.com.au  Fact Sheet for Healthcare Providers:  IncredibleEmployment.be  This test is no t yet approved or cleared by the [place] FDA and  has been authorized for detection and/or diagnosis of SARS-CoV-2 by FDA under an Emergency Use Authorization (EUA). This EUA will  remain  in effect (meaning this test can be used) for the duration of the COVID-19 declaration under Section 564(b)(1) of the Act, 21 U.S.C.section 360bbb-3(b)(1), unless the authorization is terminated  or revoked sooner.       Influenza A by PCR NEGATIVE NEGATIVE Final   Influenza B by PCR NEGATIVE NEGATIVE Final    Comment: (NOTE) The Xpert Xpress SARS-CoV-2/FLU/RSV plus assay is intended as an aid in the diagnosis of influenza from Nasopharyngeal swab specimens and should not be used as a sole basis for treatment. Nasal washings and aspirates are unacceptable for Xpert Xpress SARS-CoV-2/FLU/RSV testing.  Fact Sheet for Patients: EntrepreneurPulse.com.au  Fact Sheet for Healthcare Providers: IncredibleEmployment.be  This test is not yet approved or cleared by the [place] FDA and has been authorized for detection and/or diagnosis of SARS-CoV-2 by FDA under an Emergency Use Authorization (EUA). This EUA will remain in effect (meaning this test can be used) for the duration of the COVID-19 declaration under Section 564(b)(1) of the Act, 21 U.S.C. section 360bbb-3(b)(1), unless the authorization is terminated or revoked.  Performed at [facility], [address] [place], [place] [address]   Culture, blood (routine x 2)     Status: None   Collection Time: 02/10/22  8:40 PM   Specimen: BLOOD  Result Value Ref Range Status   Specimen Description BLOOD LEFT HAND  Final   Special Requests   Final    BOTTLES DRAWN AEROBIC AND ANAEROBIC Blood Culture adequate volume   Culture   Final    NO GROWTH 5 DAYS Performed at [facility], [address], [place], [place] [address]    Report Status 02/15/2022 FINAL  Final  Culture, blood (routine x 2)     Status: None   Collection Time: 02/10/22 10:05 PM   Specimen: BLOOD  Result Value Ref Range Status   Specimen Description BLOOD LEFT WRIST  Final   Special Requests    Final    BOTTLES DRAWN AEROBIC AND ANAEROBIC Blood Culture adequate volume   Culture   Final    NO GROWTH 5 DAYS Performed at [facility], [address] [place], [place] [address]    Report Status 02/15/2022 FINAL  Final  Resp Panel by RT-PCR (Flu A&B, Covid) Nasopharyngeal Swab     Status: None   Collection Time: 02/15/22  3:31 PM   Specimen: Nasopharyngeal Swab; Nasopharyngeal(NP) swabs in vial transport medium  Result Value Ref Range Status   SARS Coronavirus 2 by RT PCR NEGATIVE NEGATIVE Final    Comment: (NOTE) SARS-CoV-2 target nucleic acids are NOT DETECTED.  The SARS-CoV-2 RNA is generally detectable in upper respiratory specimens during the acute phase of infection. The lowest concentration of SARS-CoV-2 viral copies this assay can detect is 138 copies/mL. A negative result does not preclude SARS-Cov-2 infection and should not be used as the sole basis for  treatment or other patient management decisions. A negative result may occur with  improper specimen collection/handling, submission of specimen other than nasopharyngeal swab, presence of viral mutation(s) within the areas targeted by this assay, and inadequate number of viral copies(<138 copies/mL). A negative result must be combined with clinical observations, patient history, and epidemiological information. The expected result is Negative.  Fact Sheet for Patients:  EntrepreneurPulse.com.au  Fact Sheet for Healthcare Providers:  IncredibleEmployment.be  This test is no t yet approved or cleared by the [place] FDA and  has been authorized for detection and/or diagnosis of SARS-CoV-2 by FDA under an Emergency Use Authorization (EUA). This EUA will remain  in effect (meaning this test can be used) for the duration of the COVID-19 declaration under Section 564(b)(1) of the Act, 21 U.S.C.section 360bbb-3(b)(1), unless the authorization is terminated  or revoked  sooner.       Influenza A by PCR NEGATIVE NEGATIVE Final   Influenza B by PCR NEGATIVE NEGATIVE Final    Comment: (NOTE) The Xpert Xpress SARS-CoV-2/FLU/RSV plus assay is intended as an aid in the diagnosis of influenza from Nasopharyngeal swab specimens and should not be used as a sole basis for treatment. Nasal washings and aspirates are unacceptable for Xpert Xpress SARS-CoV-2/FLU/RSV testing.  Fact Sheet for Patients: EntrepreneurPulse.com.au  Fact Sheet for Healthcare Providers: IncredibleEmployment.be  This test is not yet approved or cleared by the [place] FDA and has been authorized for detection and/or diagnosis of SARS-CoV-2 by FDA under an Emergency Use Authorization (EUA). This EUA will remain in effect (meaning this test can be used) for the duration of the COVID-19 declaration under Section 564(b)(1) of the Act, 21 U.S.C. section 360bbb-3(b)(1), unless the authorization is terminated or revoked.  Performed at [facility], [address] [place], [place] [address]      Labs: BNP (last 3 results) Recent Labs    06/12/21 0644  BNP 8,921.1*   Basic Metabolic Panel: Recent Labs  Lab 02/09/22 1716 02/11/22 0403 02/12/22 0422 02/14/22 0343 02/15/22 1035  NA 136 135 135 133* 133*  K 4.5 4.1 4.1 4.2 4.5  CL 94* 93* 94* 93* 94*  CO2 27 26 26 23 23   GLUCOSE 104* 132* 134* 82 98  BUN 33* 27* 42* 36* <5*  CREATININE 6.18* 5.47* 6.77* 6.66* 8.24*  CALCIUM 8.8* 8.1* 8.4* 8.4* 8.9  PHOS  --   --   --   --  5.4*   Liver Function Tests: Recent Labs  Lab 02/09/22 1716 02/15/22 1035  AST 20  --   ALT 12  --   ALKPHOS 78  --   BILITOT 1.7*  --   PROT 8.1  --   ALBUMIN 3.7 2.8*   Recent Labs  Lab 02/09/22 1716  LIPASE 25   No results for input(s): AMMONIA in the last 168 hours. CBC: Recent Labs  Lab 02/09/22 1716 02/11/22 0403 02/12/22 0422 02/14/22 0343 02/15/22 1035  WBC 8.9 8.9 7.2 6.7  8.6  NEUTROABS  --  6.9 5.1 4.6  --   HGB 12.5 10.9* 11.3* 10.0* 10.4*  HCT 40.6 35.1* 36.0 31.8* 33.1*  MCV 91.2 89.8 89.3 89.3 88.5  PLT 279 240 245 217 247   Cardiac Enzymes: No results for input(s): CKTOTAL, CKMB, CKMBINDEX, TROPONINI in the last 168 hours. BNP: Invalid input(s): POCBNP CBG: Recent Labs  Lab 02/15/22 0759 02/15/22 1629 02/15/22 2201 02/16/22 0811 02/16/22 1152  GLUCAP 76 134* 89 136* 104*   D-Dimer No  results for input(s): DDIMER in the last 72 hours. Hgb A1c No results for input(s): HGBA1C in the last 72 hours. Lipid Profile No results for input(s): CHOL, HDL, LDLCALC, TRIG, CHOLHDL, LDLDIRECT in the last 72 hours. Thyroid function studies No results for input(s): TSH, T4TOTAL, T3FREE, THYROIDAB in the last 72 hours.  Invalid input(s): FREET3 Anemia work up No results for input(s): VITAMINB12, FOLATE, FERRITIN, TIBC, IRON, RETICCTPCT in the last 72 hours. Urinalysis    Component Value Date/Time   COLORURINE YELLOW (A) 11/04/2021 0035   APPEARANCEUR HAZY (A) 11/04/2021 0035   LABSPEC 1.016 11/04/2021 0035   PHURINE 7.0 11/04/2021 0035   GLUCOSEU 150 (A) 11/04/2021 0035   HGBUR SMALL (A) 11/04/2021 0035   BILIRUBINUR NEGATIVE 11/04/2021 0035   KETONESUR NEGATIVE 11/04/2021 0035   PROTEINUR >=300 (A) 11/04/2021 0035   NITRITE NEGATIVE 11/04/2021 0035   LEUKOCYTESUR SMALL (A) 11/04/2021 0035   Sepsis Labs Invalid input(s): PROCALCITONIN,  WBC,  LACTICIDVEN Microbiology Recent Results (from the past 240 hour(s))  MRSA Next Gen by PCR, Nasal     Status: None   Collection Time: 02/10/22  3:00 PM   Specimen: Nasal Mucosa; Nasal Swab  Result Value Ref Range Status   MRSA by PCR Next Gen NOT DETECTED NOT DETECTED Final    Comment: (NOTE) The GeneXpert MRSA Assay (FDA approved for NASAL specimens only), is one component of a comprehensive MRSA colonization surveillance program. It is not intended to diagnose MRSA infection nor to guide or  monitor treatment for MRSA infections. Test performance is not FDA approved in patients less than 72 years old. Performed at [facility], [address] [place], [place] [address]   Resp Panel by RT-PCR (Flu A&B, Covid) Nasal Mucosa     Status: None   Collection Time: 02/10/22  3:00 PM   Specimen: Nasal Mucosa; Nasopharyngeal(NP) swabs in vial transport medium  Result Value Ref Range Status   SARS Coronavirus 2 by RT PCR NEGATIVE NEGATIVE Final    Comment: (NOTE) SARS-CoV-2 target nucleic acids are NOT DETECTED.  The SARS-CoV-2 RNA is generally detectable in upper respiratory specimens during the acute phase of infection. The lowest concentration of SARS-CoV-2 viral copies this assay can detect is 138 copies/mL. A negative result does not preclude SARS-Cov-2 infection and should not be used as the sole basis for treatment or other patient management decisions. A negative result may occur with  improper specimen collection/handling, submission of specimen other than nasopharyngeal swab, presence of viral mutation(s) within the areas targeted by this assay, and inadequate number of viral copies(<138 copies/mL). A negative result must be combined with clinical observations, patient history, and epidemiological information. The expected result is Negative.  Fact Sheet for Patients:  EntrepreneurPulse.com.au  Fact Sheet for Healthcare Providers:  IncredibleEmployment.be  This test is no t yet approved or cleared by the [place] FDA and  has been authorized for detection and/or diagnosis of SARS-CoV-2 by FDA under an Emergency Use Authorization (EUA). This EUA will remain  in effect (meaning this test can be used) for the duration of the COVID-19 declaration under Section 564(b)(1) of the Act, 21 U.S.C.section 360bbb-3(b)(1), unless the authorization is terminated  or revoked sooner.       Influenza A by PCR NEGATIVE NEGATIVE  Final   Influenza B by PCR NEGATIVE NEGATIVE Final    Comment: (NOTE) The Xpert Xpress SARS-CoV-2/FLU/RSV plus assay is intended as an aid in the diagnosis of influenza from Nasopharyngeal swab specimens and should not  be used as a sole basis for treatment. Nasal washings and aspirates are unacceptable for Xpert Xpress SARS-CoV-2/FLU/RSV testing.  Fact Sheet for Patients: EntrepreneurPulse.com.au  Fact Sheet for Healthcare Providers: IncredibleEmployment.be  This test is not yet approved or cleared by the [place] FDA and has been authorized for detection and/or diagnosis of SARS-CoV-2 by FDA under an Emergency Use Authorization (EUA). This EUA will remain in effect (meaning this test can be used) for the duration of the COVID-19 declaration under Section 564(b)(1) of the Act, 21 U.S.C. section 360bbb-3(b)(1), unless the authorization is terminated or revoked.  Performed at [facility], [address] [place], [place] [address]   Culture, blood (routine x 2)     Status: None   Collection Time: 02/10/22  8:40 PM   Specimen: BLOOD  Result Value Ref Range Status   Specimen Description BLOOD LEFT HAND  Final   Special Requests   Final    BOTTLES DRAWN AEROBIC AND ANAEROBIC Blood Culture adequate volume   Culture   Final    NO GROWTH 5 DAYS Performed at [facility], [address], [place], [place] [address]    Report Status 02/15/2022 FINAL  Final  Culture, blood (routine x 2)     Status: None   Collection Time: 02/10/22 10:05 PM   Specimen: BLOOD  Result Value Ref Range Status   Specimen Description BLOOD LEFT WRIST  Final   Special Requests   Final    BOTTLES DRAWN AEROBIC AND ANAEROBIC Blood Culture adequate volume   Culture   Final    NO GROWTH 5 DAYS Performed at [facility], [address] [place],  [address]    Report Status 02/15/2022 FINAL  Final  Resp Panel by RT-PCR (Flu A&B,  Covid) Nasopharyngeal Swab     Status: None   Collection Time: 02/15/22  3:31 PM   Specimen: Nasopharyngeal Swab; Nasopharyngeal(NP) swabs in vial transport medium  Result Value Ref Range Status   SARS Coronavirus 2 by RT PCR NEGATIVE NEGATIVE Final    Comment: (NOTE) SARS-CoV-2 target nucleic acids are NOT DETECTED.  The SARS-CoV-2 RNA is generally detectable in upper respiratory specimens during the acute phase of infection. The lowest concentration of SARS-CoV-2 viral copies this assay can detect is 138 copies/mL. A negative result does not preclude SARS-Cov-2 infection and should not be used as the sole basis for treatment or other patient management decisions. A negative result may occur with  improper specimen collection/handling, submission of specimen other than nasopharyngeal swab, presence of viral mutation(s) within the areas targeted by this assay, and inadequate number of viral copies(<138 copies/mL). A negative result must be combined with clinical observations, patient history, and epidemiological information. The expected result is Negative.  Fact Sheet for Patients:  EntrepreneurPulse.com.au  Fact Sheet for Healthcare Providers:  IncredibleEmployment.be  This test is no t yet approved or cleared by the [place] FDA and  has been authorized for detection and/or diagnosis of SARS-CoV-2 by FDA under an Emergency Use Authorization (EUA). This EUA will remain  in effect (meaning this test can be used) for the duration of the COVID-19 declaration under Section 564(b)(1) of the Act, 21 U.S.C.section 360bbb-3(b)(1), unless the authorization is terminated  or revoked sooner.       Influenza A by PCR NEGATIVE NEGATIVE Final   Influenza B by PCR NEGATIVE NEGATIVE Final    Comment: (NOTE) The Xpert Xpress SARS-CoV-2/FLU/RSV plus assay is intended as an aid in the diagnosis of  influenza from Nasopharyngeal swab specimens and should  not be used as a sole basis for treatment. Nasal washings and aspirates are unacceptable for Xpert Xpress SARS-CoV-2/FLU/RSV testing.  Fact Sheet for Patients: EntrepreneurPulse.com.au  Fact Sheet for Healthcare Providers: IncredibleEmployment.be  This test is not yet approved or cleared by the [place] FDA and has been authorized for detection and/or diagnosis of SARS-CoV-2 by FDA under an Emergency Use Authorization (EUA). This EUA will remain in effect (meaning this test can be used) for the duration of the COVID-19 declaration under Section 564(b)(1) of the Act, 21 U.S.C. section 360bbb-3(b)(1), unless the authorization is terminated or revoked.  Performed at [facility], [address], [place], [place] [address]     Please note: You were cared for by a hospitalist during your hospital stay. Once you are discharged, your primary care physician will handle any further medical issues. Please note that NO REFILLS for any discharge medications will be authorized once you are discharged, as it is imperative that you return to your primary care physician (or establish a relationship with a primary care physician if you do not have one) for your post hospital discharge needs so that they can reassess your need for medications and monitor your lab values.    Time coordinating discharge: 40 minutes  SIGNED:   Shelly Coss, MD  [facility] 02/16/2022, 12:10 PM Pager [phone]  If 7PM-7AM, please contact night-coverage [email or web address] Password [email or web address]

## 2022-02-16 NOTE — Progress Notes (Signed)
°   02/16/22 1400  Clinical Encounter Type  Visited With Patient  Visit Type Initial;Social support  Spiritual Encounters  Spiritual Needs Grief support;Emotional   Chaplain followed up on request by patient for conversation as she needed someone to talk about life, the loss of her mom, current relationships, and the grief and loss of past and present changes in her life. Chaplain facilitated meaningful support through active listening and open-ended questions.

## 2022-02-16 NOTE — Progress Notes (Signed)
Occupational Therapy Treatment Patient Details Name: Terry Arroyo MRN: [id number] DOB: Feb 13, 1961 Today's Date: 02/16/2022   History of present illness Pt is a 61 y/o F admitted on 02/09/22 with after presenting with c/c of L side thigh pain & pt was hypertensive on presentation. CT of L femur was negative for acute abnormality. Pt is being treated for L thigh cellulitis & was started on antibiotics. MRI showed diffuse edema primarily involving the anterior compartment musculature of the left thigh, 2.9 x 1.9 cm focal collection within the vastus medialis which could be an intramuscular hematoma or abscess. Orthopedics consulted & IR consulted for possible drainage but thigh [place] did not show any focal fluid so procedure not done. PMH: ESRD on HD MWF, systolic CHF, HTN, DM2, depression, COPD, blind L eye, HFrEF, CABG x 4 (2018), stroke affecting L side (2014)   OT comments  Upon entering the room, pt seated on EOB with 10/10 c/o pain in L LE. OT encouraged pt to call for pain medication which she did during this session. Pt wanting to perform grooming tasks at sink. She stood with min A and use of RW and taking several steps to sink. Pt at times standing with UEs unsupported on RW but needing min guard for safety while standing for grooming tasks. Pt became tearful secondary to pain and requesting to return to bed. Pt remained sitting on EOB awaiting RN with medications. Call bell and all needs within reach. OT changed recommendation to short term rehab as pt currently needs physical assistance for functional tasks and balance. She reports not having assistance at home.    Recommendations for follow up therapy are one component of a multi-disciplinary discharge planning process, led by the attending physician.  Recommendations may be updated based on patient status, additional functional criteria and insurance authorization.    Follow Up Recommendations  Skilled nursing-short term rehab (<3 hours/day)     Assistance Recommended at Discharge Intermittent Supervision/Assistance  Patient can return home with the following  A little help with walking and/or transfers;Assistance with cooking/housework;Assist for transportation;Help with stairs or ramp for entrance;A little help with bathing/dressing/bathroom   Equipment Recommendations  Tub/shower seat;Other (comment);BSC/3in1 (RW)       Precautions / Restrictions Precautions Precautions: Fall       Mobility Bed Mobility               General bed mobility comments: pt seated on EOB upon entering the room    Transfers Overall transfer level: Needs assistance Equipment used: Rolling walker (2 wheels) Transfers: Sit to/from Stand Sit to Stand: Min assist                 Balance Overall balance assessment: Needs assistance Sitting-balance support: Feet supported Sitting balance-Leahy Scale: Good     Standing balance support: Reliant on assistive device for balance, During functional activity Standing balance-Leahy Scale: Fair                             ADL either performed or assessed with clinical judgement   ADL Overall ADL's : Needs assistance/impaired                                       General ADL Comments: min guard for standing at sink for self care tasks with min lifting assistance to stand from EOB with  RW    Extremity/Trunk Assessment Upper Extremity Assessment Upper Extremity Assessment: Generalized weakness   Lower Extremity Assessment Lower Extremity Assessment: Generalized weakness LLE Deficits / Details: decreased tolerance for hip abd/add and hip flex/ext as it pertains to LB ADLs.        Vision Patient Visual Report: No change from baseline            Cognition Arousal/Alertness: Awake/alert Behavior During Therapy: Flat affect Overall Cognitive Status: Within Functional Limits for tasks assessed                                                      Pertinent Vitals/ Pain       Pain Assessment Pain Assessment: 0-10 Pain Score: 10-Worst pain ever Pain Location: L thigh Pain Descriptors / Indicators: Discomfort, Aching, Moaning Pain Intervention(s): Limited activity within patient's tolerance, Monitored during session, Repositioned, Patient requesting pain meds-RN notified         Frequency  Min 2X/week        Progress Toward Goals  OT Goals(current goals can now be found in the care plan section)  Progress towards OT goals: Progressing toward goals  Acute Rehab OT Goals Patient Stated Goal: to go home OT Goal Formulation: With patient Time For Goal Achievement: 02/27/22 Potential to Achieve Goals: [place] Frequency remains appropriate;Discharge plan needs to be updated       AM-PAC OT "6 Clicks" Daily Activity     Outcome Measure   Help from another person eating meals?: None Help from another person taking care of personal grooming?: A Little Help from another person toileting, which includes using toliet, bedpan, or urinal?: A Little Help from another person bathing (including washing, rinsing, drying)?: A Little Help from another person to put on and taking off regular upper body clothing?: None Help from another person to put on and taking off regular lower body clothing?: A Little 6 Click Score: 20    End of Session Equipment Utilized During Treatment: Rolling walker (2 wheels)  OT Visit Diagnosis: Muscle weakness (generalized) (M62.81);Pain Pain - Right/Left: Left Pain - part of body: Leg   Activity Tolerance Patient tolerated treatment well   Patient Left in bed;with call bell/phone within reach;with bed alarm set   Nurse Communication Mobility status;Patient requests pain meds        Time: 8891-6945 OT Time Calculation (min): 11 min  Charges: OT General Charges $OT Visit: 1 Visit OT Treatments $Self Care/Home Management : 8-22 mins Darleen Crocker, MS, OTR/L ,  CBIS ascom [phone]  02/16/22, 10:18 AM

## 2022-02-16 NOTE — TOC Transition Note (Signed)
Transition of Care [facility]) - CM/SW Discharge Note   Patient Details  Name: Terry Arroyo MRN: [id number] Date of Birth: 05/27/1961  Transition of Care [facility]) CM/SW Contact:  Beverly Sessions, RN Phone Number: 02/16/2022, 1:20 PM   Clinical Narrative:    Notified by Hilda Blades at [facility] they have received auth  Patient walked 180 feet with PT today.  Patient now declines SNF and wishes for home health.  States she doesn't have a preference of home health agency.  Referral made to [facility] with [facility]  Patient states that her father will pick her up around [place] dialysis liaison notified of discharge Hilda Blades at [facility] updated    Final next level of care: [place] Barriers to Discharge: Continued Medical Work up   Patient Goals and CMS Choice Patient states their goals for this hospitalization and ongoing recovery are:: Return home      Discharge Placement                       Discharge Plan and Services                                     Social Determinants of Health (SDOH) Interventions     Readmission Risk Interventions Readmission Risk Prevention Plan 10/29/2021 11/03/2020 10/23/2020  Transportation Screening Complete Complete Complete  Medication Review Press photographer) Complete Complete Complete  PCP or Specialist appointment within 3-5 days of discharge Complete Not Complete Complete  PCP/Specialist Appt Not Complete comments - To be scheduled by unit secretary. [name] or [place] - Complete Complete  SW Recovery Care/Counseling Consult Complete Complete Complete  Palliative Care Screening Not Applicable Not Applicable Not [place] Not Applicable Patient Refused Not Applicable  SNF Comments - Patient refuses states she is able to take care of herself. -  Some recent data might be hidden

## 2022-02-19 DIAGNOSIS — Z992 Dependence on renal dialysis: Secondary | ICD-10-CM | POA: Diagnosis not present

## 2022-02-19 DIAGNOSIS — N2581 Secondary hyperparathyroidism of renal origin: Secondary | ICD-10-CM | POA: Diagnosis not present

## 2022-02-19 DIAGNOSIS — N186 End stage renal disease: Secondary | ICD-10-CM | POA: Diagnosis not present

## 2022-02-22 DIAGNOSIS — N2581 Secondary hyperparathyroidism of renal origin: Secondary | ICD-10-CM | POA: Diagnosis not present

## 2022-02-22 DIAGNOSIS — Z992 Dependence on renal dialysis: Secondary | ICD-10-CM | POA: Diagnosis not present

## 2022-02-22 DIAGNOSIS — N186 End stage renal disease: Secondary | ICD-10-CM | POA: Diagnosis not present

## 2022-02-23 DIAGNOSIS — M60052 Infective myositis, left thigh: Secondary | ICD-10-CM | POA: Diagnosis not present

## 2022-02-24 DIAGNOSIS — N2581 Secondary hyperparathyroidism of renal origin: Secondary | ICD-10-CM | POA: Diagnosis not present

## 2022-02-24 DIAGNOSIS — Z992 Dependence on renal dialysis: Secondary | ICD-10-CM | POA: Diagnosis not present

## 2022-02-24 DIAGNOSIS — N186 End stage renal disease: Secondary | ICD-10-CM | POA: Diagnosis not present

## 2022-02-26 DIAGNOSIS — N2581 Secondary hyperparathyroidism of renal origin: Secondary | ICD-10-CM | POA: Diagnosis not present

## 2022-02-26 DIAGNOSIS — Z992 Dependence on renal dialysis: Secondary | ICD-10-CM | POA: Diagnosis not present

## 2022-02-26 DIAGNOSIS — N186 End stage renal disease: Secondary | ICD-10-CM | POA: Diagnosis not present

## 2022-03-01 DIAGNOSIS — Z992 Dependence on renal dialysis: Secondary | ICD-10-CM | POA: Diagnosis not present

## 2022-03-01 DIAGNOSIS — N186 End stage renal disease: Secondary | ICD-10-CM | POA: Diagnosis not present

## 2022-03-01 DIAGNOSIS — N2581 Secondary hyperparathyroidism of renal origin: Secondary | ICD-10-CM | POA: Diagnosis not present

## 2022-03-01 DIAGNOSIS — E1122 Type 2 diabetes mellitus with diabetic chronic kidney disease: Secondary | ICD-10-CM | POA: Diagnosis not present

## 2022-03-01 DIAGNOSIS — J449 Chronic obstructive pulmonary disease, unspecified: Secondary | ICD-10-CM | POA: Diagnosis not present

## 2022-03-01 DIAGNOSIS — E1159 Type 2 diabetes mellitus with other circulatory complications: Secondary | ICD-10-CM | POA: Diagnosis not present

## 2022-03-01 DIAGNOSIS — E1169 Type 2 diabetes mellitus with other specified complication: Secondary | ICD-10-CM | POA: Diagnosis not present

## 2022-03-01 DIAGNOSIS — I152 Hypertension secondary to endocrine disorders: Secondary | ICD-10-CM | POA: Diagnosis not present

## 2022-03-01 DIAGNOSIS — I5022 Chronic systolic (congestive) heart failure: Secondary | ICD-10-CM | POA: Diagnosis not present

## 2022-03-03 DIAGNOSIS — Z992 Dependence on renal dialysis: Secondary | ICD-10-CM | POA: Diagnosis not present

## 2022-03-03 DIAGNOSIS — N2581 Secondary hyperparathyroidism of renal origin: Secondary | ICD-10-CM | POA: Diagnosis not present

## 2022-03-03 DIAGNOSIS — N186 End stage renal disease: Secondary | ICD-10-CM | POA: Diagnosis not present

## 2022-03-05 DIAGNOSIS — N2581 Secondary hyperparathyroidism of renal origin: Secondary | ICD-10-CM | POA: Diagnosis not present

## 2022-03-05 DIAGNOSIS — Z992 Dependence on renal dialysis: Secondary | ICD-10-CM | POA: Diagnosis not present

## 2022-03-05 DIAGNOSIS — N186 End stage renal disease: Secondary | ICD-10-CM | POA: Diagnosis not present

## 2022-03-08 DIAGNOSIS — N186 End stage renal disease: Secondary | ICD-10-CM | POA: Diagnosis not present

## 2022-03-08 DIAGNOSIS — N2581 Secondary hyperparathyroidism of renal origin: Secondary | ICD-10-CM | POA: Diagnosis not present

## 2022-03-08 DIAGNOSIS — Z992 Dependence on renal dialysis: Secondary | ICD-10-CM | POA: Diagnosis not present

## 2022-03-10 DIAGNOSIS — Z992 Dependence on renal dialysis: Secondary | ICD-10-CM | POA: Diagnosis not present

## 2022-03-10 DIAGNOSIS — N186 End stage renal disease: Secondary | ICD-10-CM | POA: Diagnosis not present

## 2022-03-10 DIAGNOSIS — N2581 Secondary hyperparathyroidism of renal origin: Secondary | ICD-10-CM | POA: Diagnosis not present

## 2022-03-11 DIAGNOSIS — M7041 Prepatellar bursitis, right knee: Secondary | ICD-10-CM | POA: Diagnosis not present

## 2022-03-12 DIAGNOSIS — N2581 Secondary hyperparathyroidism of renal origin: Secondary | ICD-10-CM | POA: Diagnosis not present

## 2022-03-12 DIAGNOSIS — Z992 Dependence on renal dialysis: Secondary | ICD-10-CM | POA: Diagnosis not present

## 2022-03-12 DIAGNOSIS — N186 End stage renal disease: Secondary | ICD-10-CM | POA: Diagnosis not present

## 2022-03-15 DIAGNOSIS — Z992 Dependence on renal dialysis: Secondary | ICD-10-CM | POA: Diagnosis not present

## 2022-03-15 DIAGNOSIS — N2581 Secondary hyperparathyroidism of renal origin: Secondary | ICD-10-CM | POA: Diagnosis not present

## 2022-03-15 DIAGNOSIS — N186 End stage renal disease: Secondary | ICD-10-CM | POA: Diagnosis not present

## 2022-03-17 DIAGNOSIS — N2581 Secondary hyperparathyroidism of renal origin: Secondary | ICD-10-CM | POA: Diagnosis not present

## 2022-03-17 DIAGNOSIS — N186 End stage renal disease: Secondary | ICD-10-CM | POA: Diagnosis not present

## 2022-03-17 DIAGNOSIS — Z992 Dependence on renal dialysis: Secondary | ICD-10-CM | POA: Diagnosis not present

## 2022-03-18 DIAGNOSIS — I208 Other forms of angina pectoris: Secondary | ICD-10-CM | POA: Diagnosis not present

## 2022-03-18 DIAGNOSIS — R609 Edema, unspecified: Secondary | ICD-10-CM | POA: Diagnosis not present

## 2022-03-18 DIAGNOSIS — I25118 Atherosclerotic heart disease of native coronary artery with other forms of angina pectoris: Secondary | ICD-10-CM | POA: Diagnosis not present

## 2022-03-18 DIAGNOSIS — Z951 Presence of aortocoronary bypass graft: Secondary | ICD-10-CM | POA: Diagnosis not present

## 2022-03-18 DIAGNOSIS — R0609 Other forms of dyspnea: Secondary | ICD-10-CM | POA: Diagnosis not present

## 2022-03-18 DIAGNOSIS — I10 Essential (primary) hypertension: Secondary | ICD-10-CM | POA: Diagnosis not present

## 2022-03-18 DIAGNOSIS — R0602 Shortness of breath: Secondary | ICD-10-CM | POA: Diagnosis not present

## 2022-03-18 DIAGNOSIS — I2510 Atherosclerotic heart disease of native coronary artery without angina pectoris: Secondary | ICD-10-CM | POA: Diagnosis not present

## 2022-03-18 DIAGNOSIS — N186 End stage renal disease: Secondary | ICD-10-CM | POA: Diagnosis not present

## 2022-03-19 DIAGNOSIS — Z992 Dependence on renal dialysis: Secondary | ICD-10-CM | POA: Diagnosis not present

## 2022-03-19 DIAGNOSIS — N2581 Secondary hyperparathyroidism of renal origin: Secondary | ICD-10-CM | POA: Diagnosis not present

## 2022-03-19 DIAGNOSIS — N186 End stage renal disease: Secondary | ICD-10-CM | POA: Diagnosis not present

## 2022-03-22 DIAGNOSIS — N186 End stage renal disease: Secondary | ICD-10-CM | POA: Diagnosis not present

## 2022-03-22 DIAGNOSIS — N2581 Secondary hyperparathyroidism of renal origin: Secondary | ICD-10-CM | POA: Diagnosis not present

## 2022-03-22 DIAGNOSIS — Z992 Dependence on renal dialysis: Secondary | ICD-10-CM | POA: Diagnosis not present

## 2022-03-24 DIAGNOSIS — N2581 Secondary hyperparathyroidism of renal origin: Secondary | ICD-10-CM | POA: Diagnosis not present

## 2022-03-24 DIAGNOSIS — N186 End stage renal disease: Secondary | ICD-10-CM | POA: Diagnosis not present

## 2022-03-24 DIAGNOSIS — Z992 Dependence on renal dialysis: Secondary | ICD-10-CM | POA: Diagnosis not present

## 2022-03-26 DIAGNOSIS — Z992 Dependence on renal dialysis: Secondary | ICD-10-CM | POA: Diagnosis not present

## 2022-03-26 DIAGNOSIS — N186 End stage renal disease: Secondary | ICD-10-CM | POA: Diagnosis not present

## 2022-03-26 DIAGNOSIS — N2581 Secondary hyperparathyroidism of renal origin: Secondary | ICD-10-CM | POA: Diagnosis not present

## 2022-03-29 ENCOUNTER — Encounter: Payer: Self-pay | Admitting: Intensive Care

## 2022-03-29 ENCOUNTER — Emergency Department: Payer: Medicare HMO

## 2022-03-29 ENCOUNTER — Emergency Department
Admission: EM | Admit: 2022-03-29 | Discharge: 2022-03-29 | Disposition: A | Discharge status: Home / Self Care | Payer: Medicare HMO | Attending: Emergency Medicine | Admitting: Emergency Medicine

## 2022-03-29 ENCOUNTER — Other Ambulatory Visit: Payer: Self-pay

## 2022-03-29 DIAGNOSIS — R509 Fever, unspecified: Secondary | ICD-10-CM | POA: Diagnosis not present

## 2022-03-29 DIAGNOSIS — E1122 Type 2 diabetes mellitus with diabetic chronic kidney disease: Secondary | ICD-10-CM | POA: Insufficient documentation

## 2022-03-29 DIAGNOSIS — I2510 Atherosclerotic heart disease of native coronary artery without angina pectoris: Secondary | ICD-10-CM | POA: Diagnosis not present

## 2022-03-29 DIAGNOSIS — M25561 Pain in right knee: Secondary | ICD-10-CM | POA: Diagnosis not present

## 2022-03-29 DIAGNOSIS — J449 Chronic obstructive pulmonary disease, unspecified: Secondary | ICD-10-CM | POA: Diagnosis not present

## 2022-03-29 DIAGNOSIS — I120 Hypertensive chronic kidney disease with stage 5 chronic kidney disease or end stage renal disease: Secondary | ICD-10-CM | POA: Diagnosis not present

## 2022-03-29 DIAGNOSIS — N186 End stage renal disease: Secondary | ICD-10-CM | POA: Diagnosis not present

## 2022-03-29 DIAGNOSIS — M7121 Synovial cyst of popliteal space [Baker], right knee: Secondary | ICD-10-CM | POA: Diagnosis not present

## 2022-03-29 DIAGNOSIS — Z992 Dependence on renal dialysis: Secondary | ICD-10-CM | POA: Insufficient documentation

## 2022-03-29 DIAGNOSIS — Z20822 Contact with and (suspected) exposure to covid-19: Secondary | ICD-10-CM | POA: Diagnosis not present

## 2022-03-29 DIAGNOSIS — R0602 Shortness of breath: Secondary | ICD-10-CM | POA: Diagnosis not present

## 2022-03-29 DIAGNOSIS — R739 Hyperglycemia, unspecified: Secondary | ICD-10-CM | POA: Diagnosis not present

## 2022-03-29 DIAGNOSIS — K529 Noninfective gastroenteritis and colitis, unspecified: Secondary | ICD-10-CM | POA: Insufficient documentation

## 2022-03-29 DIAGNOSIS — I10 Essential (primary) hypertension: Secondary | ICD-10-CM | POA: Diagnosis not present

## 2022-03-29 DIAGNOSIS — R197 Diarrhea, unspecified: Secondary | ICD-10-CM | POA: Diagnosis present

## 2022-03-29 DIAGNOSIS — M11261 Other chondrocalcinosis, right knee: Secondary | ICD-10-CM | POA: Diagnosis not present

## 2022-03-29 DIAGNOSIS — R531 Weakness: Secondary | ICD-10-CM | POA: Diagnosis not present

## 2022-03-29 DIAGNOSIS — I517 Cardiomegaly: Secondary | ICD-10-CM | POA: Diagnosis not present

## 2022-03-29 DIAGNOSIS — J90 Pleural effusion, not elsewhere classified: Secondary | ICD-10-CM | POA: Diagnosis not present

## 2022-03-29 LAB — CBC WITH DIFFERENTIAL/PLATELET
Abs Immature Granulocytes: 0.01 10*3/uL (ref 0.00–0.07)
Basophils Absolute: 0.1 10*3/uL (ref 0.0–0.1)
Basophils Relative: 1 %
Eosinophils Absolute: 0.4 10*3/uL (ref 0.0–0.5)
Eosinophils Relative: 6 %
HCT: 34.9 % — ABNORMAL LOW (ref 36.0–46.0)
Hemoglobin: 10.9 g/dL — ABNORMAL LOW (ref 12.0–15.0)
Immature Granulocytes: 0 %
Lymphocytes Relative: 20 %
Lymphs Abs: 1.2 10*3/uL (ref 0.7–4.0)
MCH: 27.1 pg (ref 26.0–34.0)
MCHC: 31.2 g/dL (ref 30.0–36.0)
MCV: 86.8 fL (ref 80.0–100.0)
Monocytes Absolute: 0.5 10*3/uL (ref 0.1–1.0)
Monocytes Relative: 7 %
Neutro Abs: 4.2 10*3/uL (ref 1.7–7.7)
Neutrophils Relative %: 66 %
Platelets: 160 10*3/uL (ref 150–400)
RBC: 4.02 MIL/uL (ref 3.87–5.11)
RDW: 15 % (ref 11.5–15.5)
WBC: 6.3 10*3/uL (ref 4.0–10.5)
nRBC: 0 % (ref 0.0–0.2)

## 2022-03-29 LAB — COMPREHENSIVE METABOLIC PANEL
ALT: 15 U/L (ref 0–44)
AST: 16 U/L (ref 15–41)
Albumin: 3.3 g/dL — ABNORMAL LOW (ref 3.5–5.0)
Alkaline Phosphatase: 80 U/L (ref 38–126)
Anion gap: 12 (ref 5–15)
BUN: 33 mg/dL — ABNORMAL HIGH (ref 6–20)
CO2: 25 mmol/L (ref 22–32)
Calcium: 8.5 mg/dL — ABNORMAL LOW (ref 8.9–10.3)
Chloride: 98 mmol/L (ref 98–111)
Creatinine, Ser: 5.84 mg/dL — ABNORMAL HIGH (ref 0.44–1.00)
GFR, Estimated: 8 mL/min — ABNORMAL LOW (ref >60)
Glucose, Bld: 171 mg/dL — ABNORMAL HIGH (ref 70–99)
Potassium: 3.7 mmol/L (ref 3.5–5.1)
Sodium: 135 mmol/L (ref 135–145)
Total Bilirubin: 0.9 mg/dL (ref 0.3–1.2)
Total Protein: 7.7 g/dL (ref 6.5–8.1)

## 2022-03-29 LAB — LACTIC ACID, PLASMA
Lactic Acid, Venous: 1.4 mmol/L (ref 0.5–1.9)
Lactic Acid, Venous: 1.2 mmol/L (ref 0.5–1.9)

## 2022-03-29 LAB — TROPONIN I (HIGH SENSITIVITY)
Troponin I (High Sensitivity): 26 ng/L — ABNORMAL HIGH (ref <18)
Troponin I (High Sensitivity): 25 ng/L — ABNORMAL HIGH (ref <18)

## 2022-03-29 LAB — C-REACTIVE PROTEIN: CRP: 0.7 mg/dL (ref <1.0)

## 2022-03-29 LAB — SEDIMENTATION RATE: Sed Rate: 28 mm/hr (ref 0–30)

## 2022-03-29 LAB — CK: Total CK: 133 U/L (ref 38–234)

## 2022-03-29 LAB — APTT: aPTT: 30 seconds (ref 24–36)

## 2022-03-29 LAB — PROTIME-INR
INR: 1.2 (ref 0.8–1.2)
Prothrombin Time: 15.3 seconds — ABNORMAL HIGH (ref 11.4–15.2)

## 2022-03-29 LAB — MAGNESIUM: Magnesium: 2 mg/dL (ref 1.7–2.4)

## 2022-03-29 LAB — RESP PANEL BY RT-PCR (FLU A&B, COVID) ARPGX2
Influenza A by PCR: NEGATIVE
Influenza B by PCR: NEGATIVE
SARS Coronavirus 2 by RT PCR: NEGATIVE

## 2022-03-29 LAB — LIPASE, BLOOD: Lipase: 47 U/L (ref 11–51)

## 2022-03-29 LAB — PROCALCITONIN: Procalcitonin: <0.1 ng/mL

## 2022-03-29 IMAGING — DX DG CHEST 1V PORT
1 series · 1 of 1 positions shown · non-contrast
Comparison: [DATE]

CLINICAL DATA: Shortness of breath, sepsis evaluation

EXAM:
PORTABLE CHEST 1 VIEW

[chest ap]
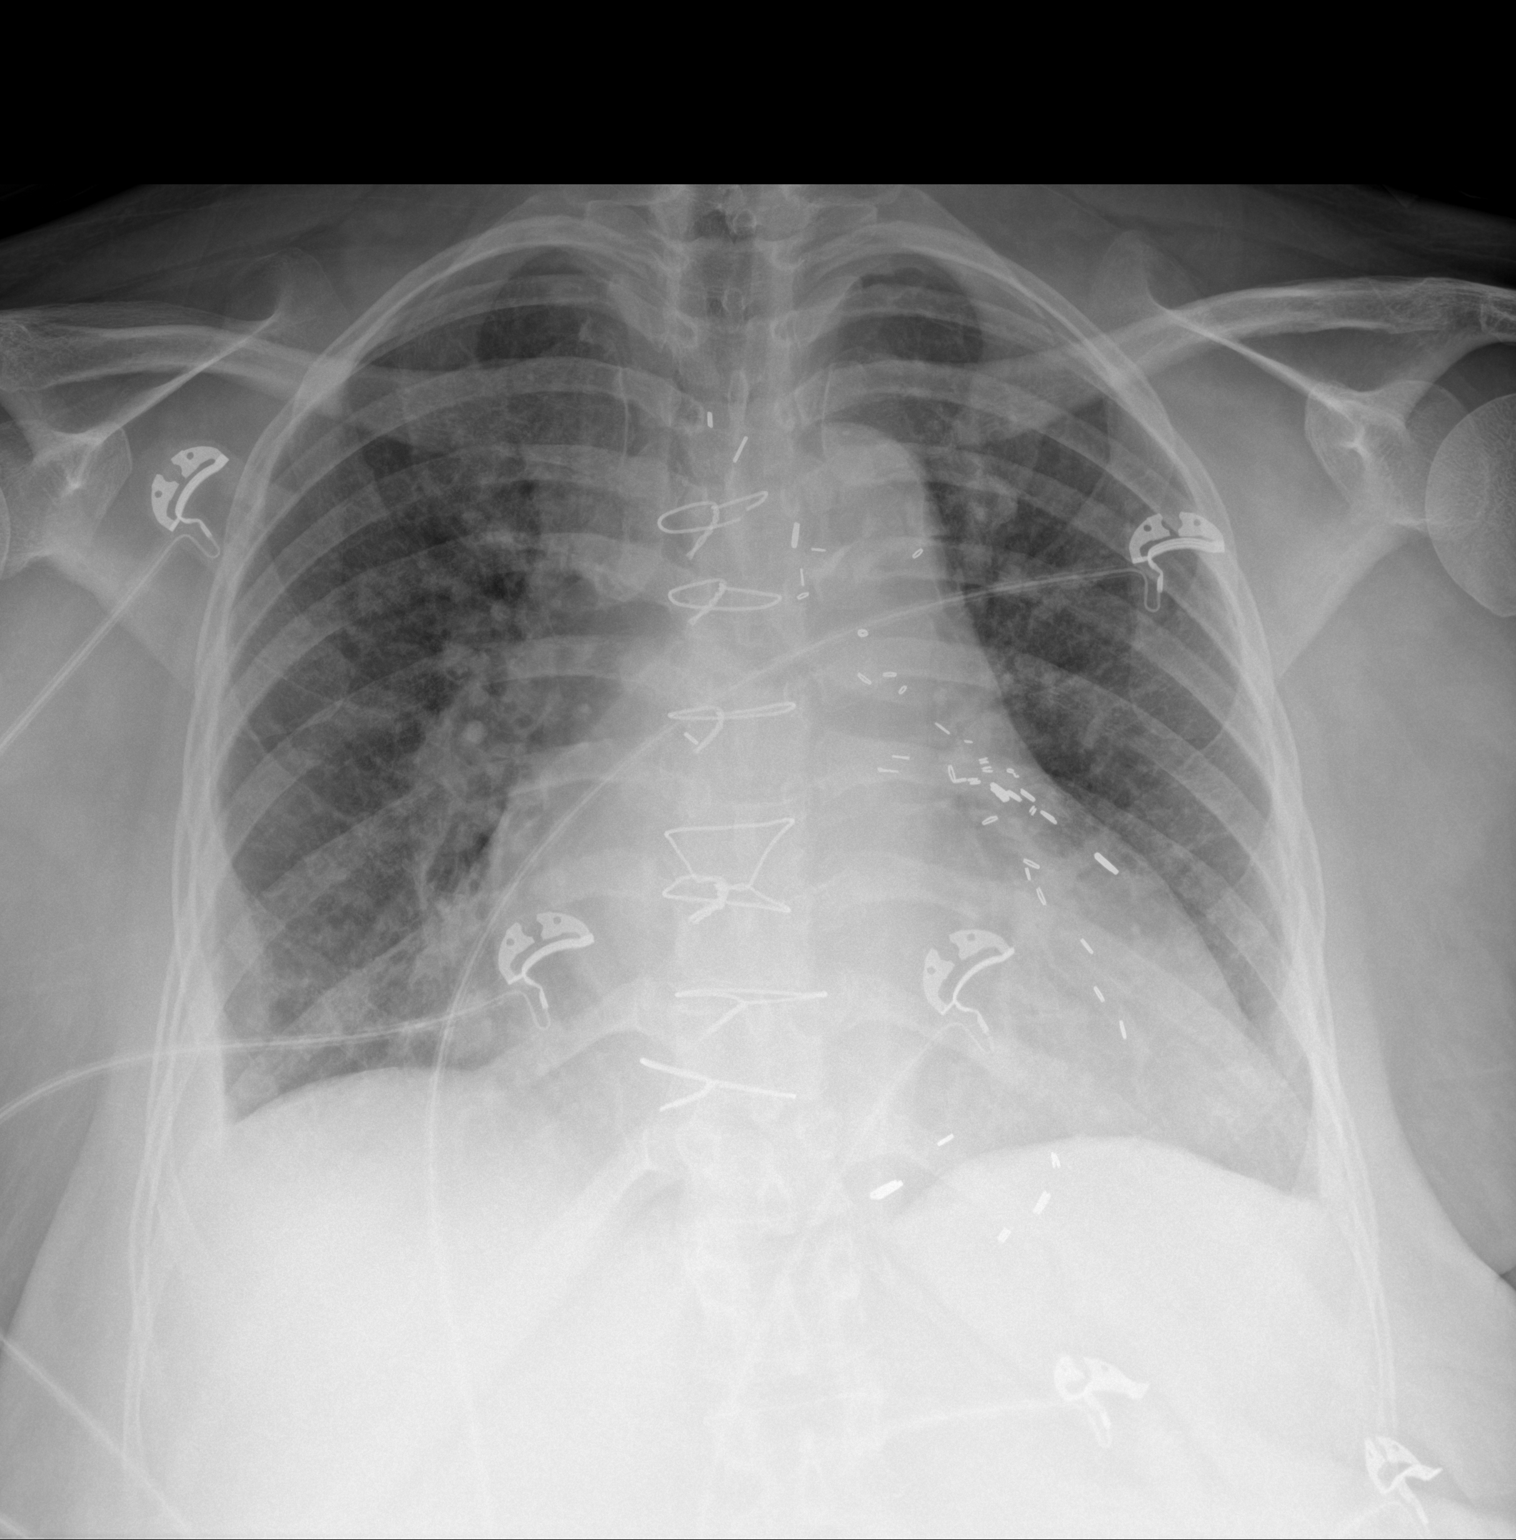

[1 of 1 positions shown; findings below may reference images not displayed]

FINDINGS: Remote coronary bypass changes as before. Heart is enlarged with
vascular congestion. Small right pleural effusion suspected
laterally. Negative for acute CHF or pneumonia. No pneumothorax.
Trachea midline. Aorta atherosclerotic. No acute osseous finding.
IMPRESSION: Cardiomegaly with vascular congestion and small right pleural
effusion.

Aortic Atherosclerosis ([2W]-[2W]).

## 2022-03-29 IMAGING — DX DG KNEE COMPLETE 4+V*R*
4 series · 4 of 4 positions shown · non-contrast
Comparison: None.

CLINICAL DATA: Knee pain, shortness of breath

EXAM:
RIGHT KNEE - COMPLETE 4+ VIEW

[knee lat]
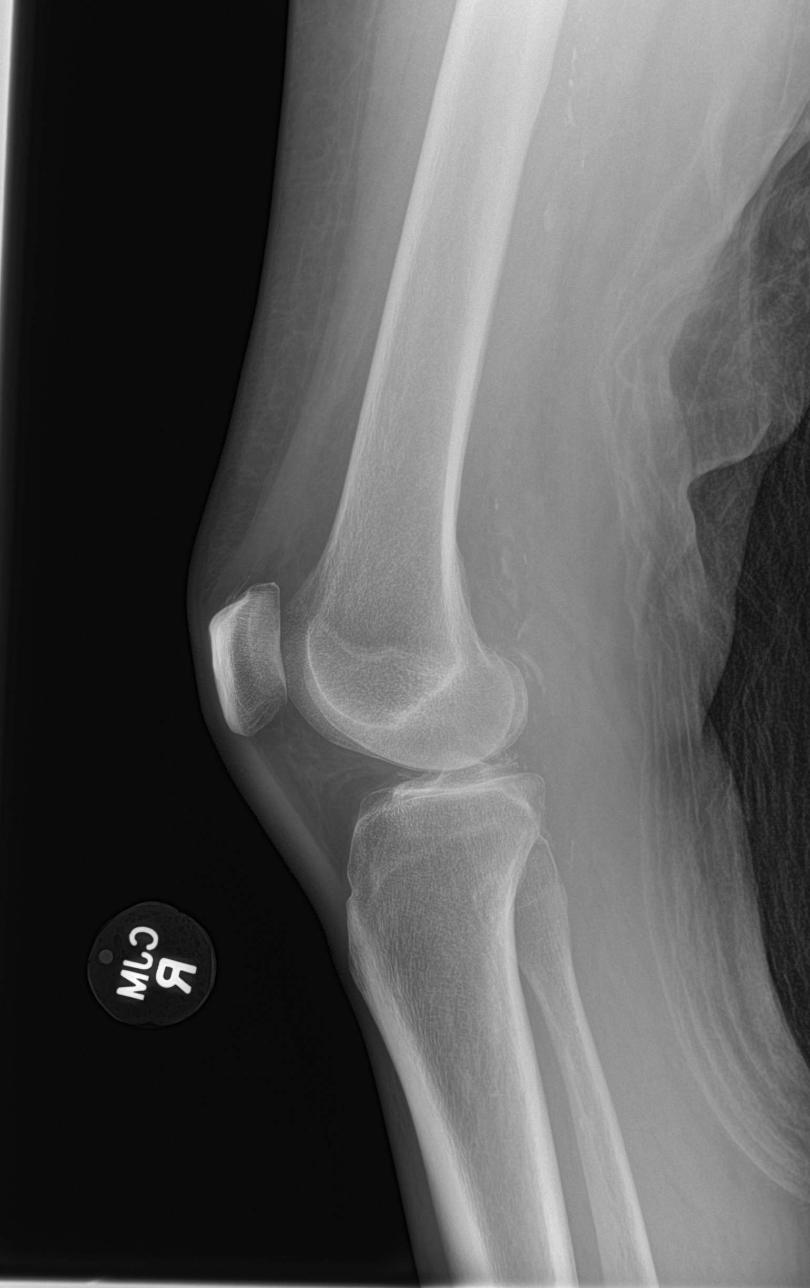

[knee ap]
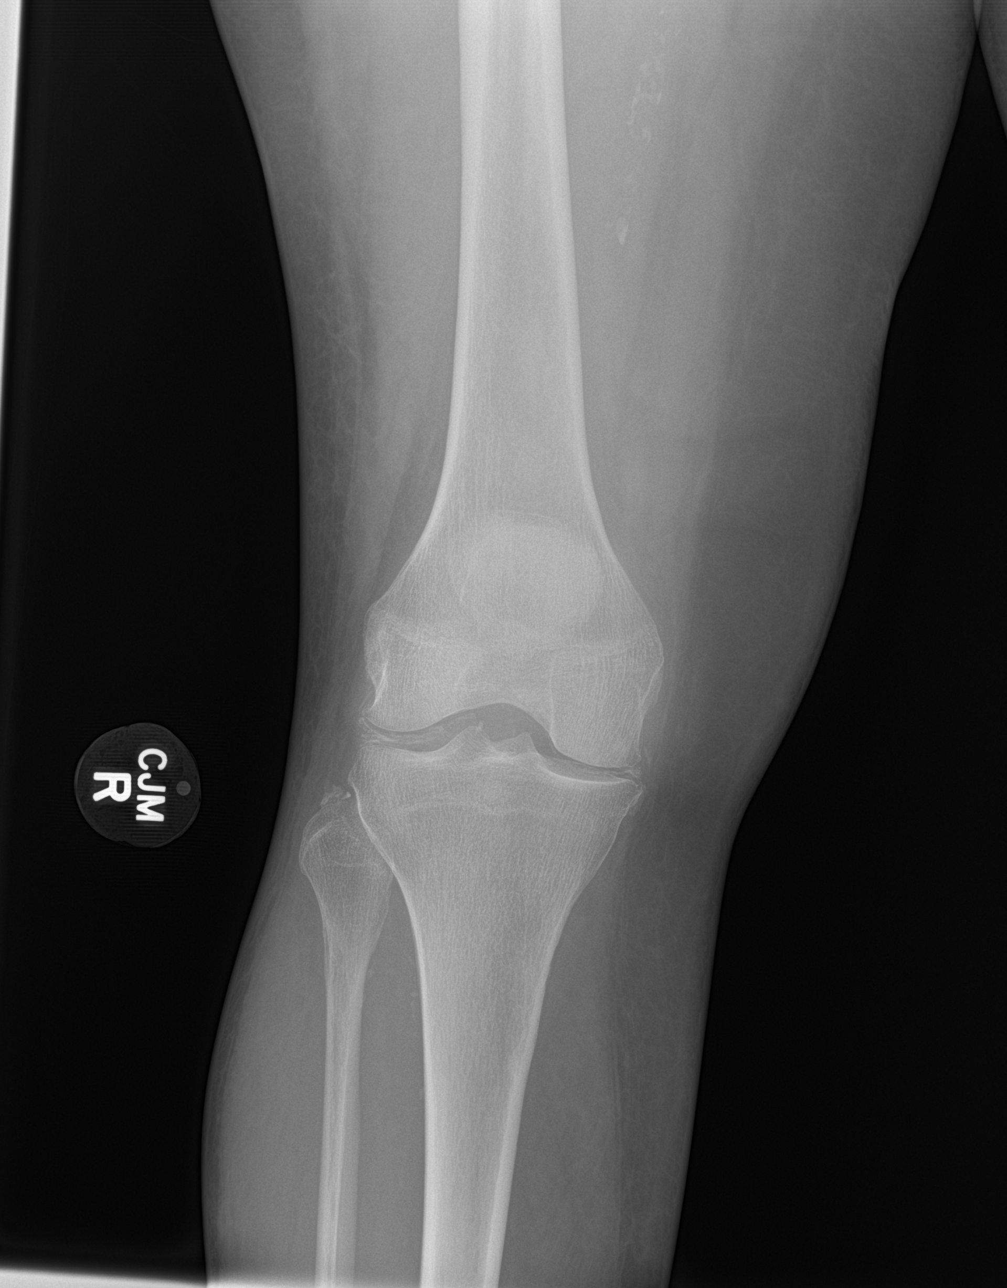

[knee obl (1 of 2)]
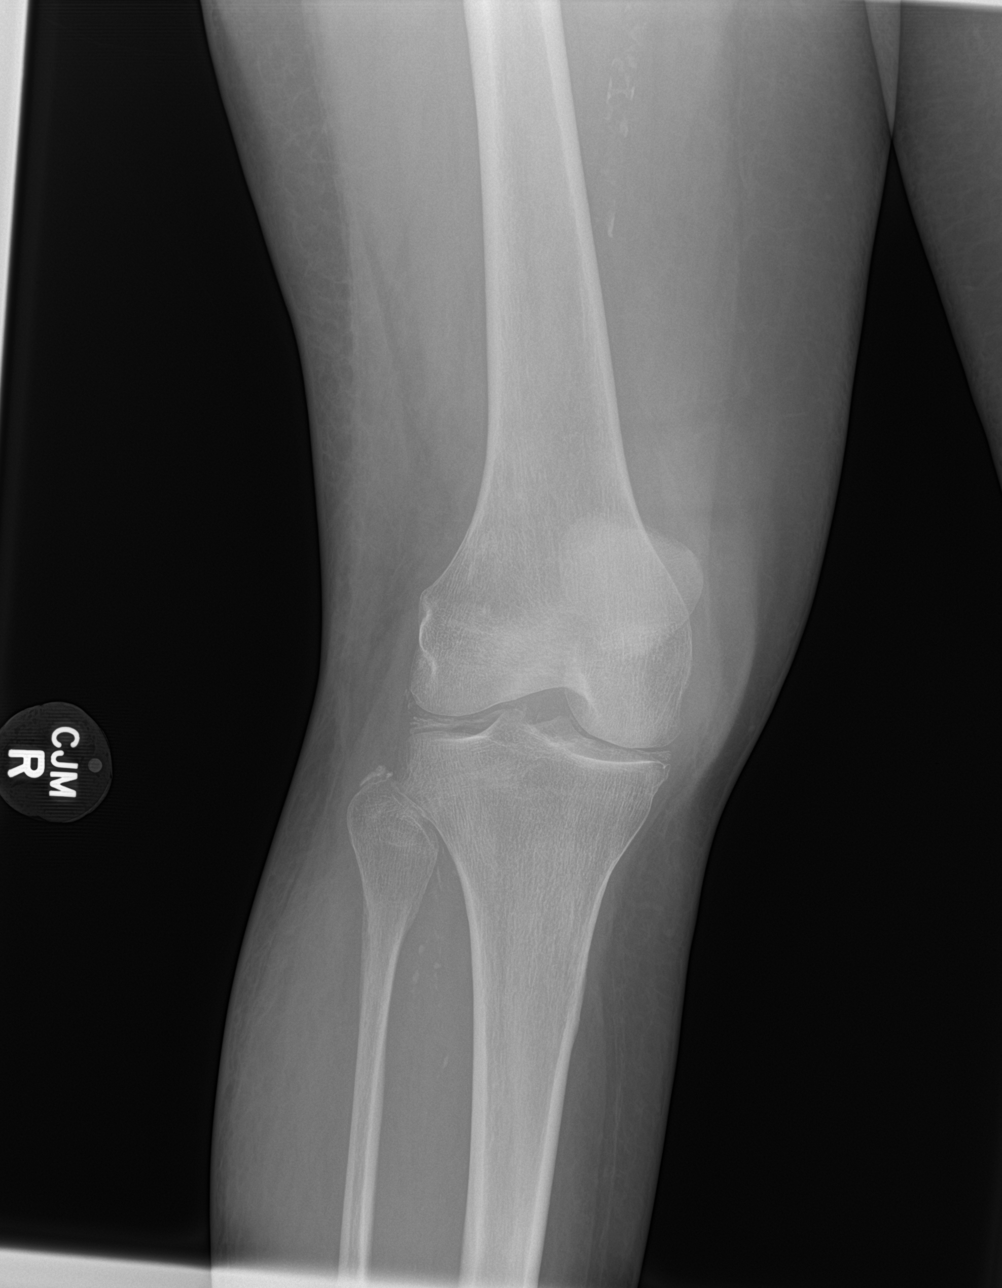

[knee obl (2 of 2)]
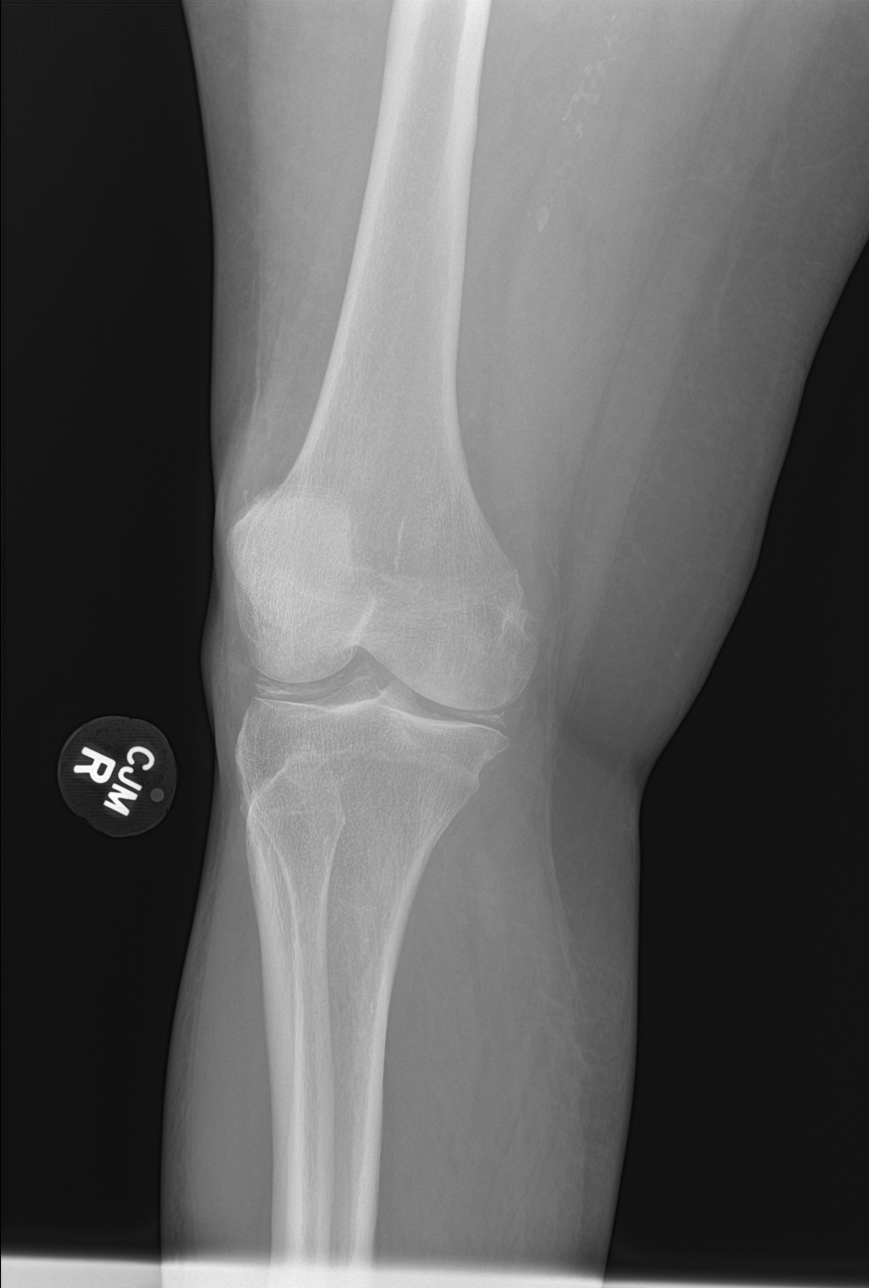

[4 of 4 positions shown; findings below may reference images not displayed]

FINDINGS: No fracture or dislocation of the right knee. Chondrocalcinosis of
the femorotibial compartments without significant joint space height
loss or osteophytosis. No knee joint effusion. Vascular calcinosis.
IMPRESSION: 1.  No fracture or dislocation of the right knee.

2. Chondrocalcinosis of the femorotibial compartments, suggesting
CPPD (pseudogout) without significant joint space height loss or
osteophytosis.

3.  No knee joint effusion.

## 2022-03-29 MED ORDER — LORAZEPAM 2 MG/ML IJ SOLN
0.5000 mg | Freq: Once | INTRAMUSCULAR | Status: AC
  Administered 2022-03-29: 0.5 mg via INTRAVENOUS
  Filled 2022-03-29: qty 1

## 2022-03-29 MED ORDER — OXYCODONE HCL 5 MG PO TABS: 5.0000 mg | ORAL_TABLET | Freq: Four times a day (QID) | ORAL | 0 refills | Status: AC | PRN

## 2022-03-29 MED ORDER — OXYCODONE HCL 5 MG PO TABS
5.0000 mg | ORAL_TABLET | Freq: Once | ORAL | Status: AC
  Administered 2022-03-29: 5 mg via ORAL
  Filled 2022-03-29: qty 1

## 2022-03-29 MED ORDER — ACETAMINOPHEN 500 MG PO TABS
1000.0000 mg | ORAL_TABLET | Freq: Once | ORAL | Status: DC
Start: 2022-03-29 — End: 2022-03-29

## 2022-03-29 MED ORDER — ONDANSETRON HCL 4 MG/2ML IJ SOLN: 4.0000 mg | Freq: Once | INTRAMUSCULAR | Status: DC

## 2022-03-29 NOTE — ED Triage Notes (Signed)
Pt c/o diarrhea, sob, emesis, and fever. Dialysis patient M,W,F. Acces on right arm.  ?

## 2022-03-29 NOTE — ED Provider Notes (Addendum)
? ?[facility] ?Provider Note ? ? ? Event Date/Time  ? First MD Initiated Contact with Patient 03/29/22 [phone]   ?  (approximate) ? ? ?History  ? ?Shortness of Breath and Diarrhea ? ? ?HPI ? ?Terry Arroyo is a 61 y.o. female with ESRD on dialysis Monday Wednesday Friday, hypertension, hyperlipidemia, diabetes, coronary disease, COPD who comes in with concerns for weakness.  Patient reports having diarrhea and vomiting for the past few days.  She states that she was not able to keep down any of her medications today due to the vomiting.  Patient had a temperature of 101 with EMS. No medications given.  She also reports multiple episodes of diarrhea.  She also reports some shortness of breath.  Denies any falls hitting her head denies any chest pain.  Denies any abdominal pain.  Patient reports that she should be a dialysis today but has not yet gone.  Denies missing dialysis on Friday.  She reports that there is somebody in dialysis who is coughing a lot but she does not have any other known contacts that have been sick.  She denies any rashes. ? ?Physical Exam  ? ?Triage Vital Signs: ?ED Triage Vitals  ?Enc Vitals Group  ?   BP 03/29/22 0830 (!) 160/77  ?   Pulse Rate 03/29/22 0830 64  ?   Resp 03/29/22 0830 (!) 26  ?   Temp 03/29/22 0830 98.7 ?F (37.1 ?C)  ?   Temp Source 03/29/22 0830 Oral  ?   SpO2 03/29/22 0830 97 %  ?   Weight 03/29/22 0832 168 lb (76.2 kg)  ?   Height 03/29/22 0832 '5\' 5"'$  (1.651 m)  ?   Head Circumference --   ?   Peak Flow --   ?   Pain Score 03/29/22 0832 3  ?   Pain Loc --   ?   Pain Edu? --   ?   Excl. in [place]? --   ? ? ?Most recent vital signs: ?Vitals:  ? 03/29/22 0830  ?BP: (!) 160/77  ?Pulse: 64  ?Resp: (!) 26  ?Temp: 98.7 ?F (37.1 ?C)  ?SpO2: 97%  ? ? ? ?General: Awake, no distress.  ?CV:  Good peripheral perfusion.  ?Resp:  Normal effort.  ?Abd:  No distention.  Nontender ?Other:  Right arm fistula with good thrill.  No significant swelling in the legs ? ? ?ED  Results / Procedures / Treatments  ? ?Labs ?(all labs ordered are listed, but only abnormal results are displayed) ?Labs Reviewed  ?RESP PANEL BY RT-PCR (FLU A&B, COVID) [id number]  ?CULTURE, BLOOD (ROUTINE X 2)  ?CULTURE, BLOOD (ROUTINE X 2)  ?URINE CULTURE  ?C DIFFICILE QUICK SCREEN W PCR REFLEX    ?GASTROINTESTINAL PANEL BY PCR, STOOL (REPLACES STOOL CULTURE)  ?LACTIC ACID, PLASMA  ?LACTIC ACID, PLASMA  ?COMPREHENSIVE METABOLIC PANEL  ?CBC WITH DIFFERENTIAL/PLATELET  ?PROTIME-INR  ?APTT  ?URINALYSIS, COMPLETE (UACMP) WITH MICROSCOPIC  ?LIPASE, BLOOD  ?PROCALCITONIN  ?TROPONIN I (HIGH SENSITIVITY)  ? ? ? ?EKG ? ?My interpretation of EKG: ? ?Normal sinus rate of 68 without any ST elevation or T wave inversions, QTc slightly prolonged at 506 ? ?Normal sinus rate of 70 without any ST elevation or T wave inversions, QTc slightly prolonged at 505 ? ?RADIOLOGY ?I have reviewed the xray personally and she has a small right pleural effusion ? ?IMPRESSION: ?Cardiomegaly with vascular congestion and small right pleural ?effusion. ? ?PROCEDURES: ? ?Critical Care performed: No ? ?.  1-3 Lead EKG Interpretation ?Performed by: Vanessa Elba, MD ?Authorized by: Vanessa [place], MD  ? ?  Interpretation: normal   ?  ECG rate:  60 ?  ECG rate assessment: normal   ?  Rhythm: sinus rhythm   ?  Ectopy: none   ?  Conduction: normal   ? ? ?MEDICATIONS ORDERED IN ED: ?Medications  ?LORazepam (ATIVAN) injection 0.5 mg (0.5 mg Intravenous Given 03/29/22 1049)  ?oxyCODONE (Oxy IR/ROXICODONE) immediate release tablet 5 mg (5 mg Oral Given 03/29/22 1243)  ? ? ? ?IMPRESSION / MDM / ASSESSMENT AND PLAN / ED COURSE  ?I reviewed the triage vital signs and the nursing notes. ?             ?               ? ?Patient is a dialysis patient who comes in weakness.  There was concern for patient having a temperature with EMS however we checked with multiple thermometers and she is afebrile here without any Tylenol or ibuprofen given.  ? ?Chest x-ray ordered  evaluate for pneumonia, COVID, flu, labs to evaluate for Electra abnormalities, AKI.  This could be gastroenteritis given she does report nausea vomiting diarrhea.  Abdomen soft and nontender.  Low suspicion for acute abdominal process.  Ordered C. difficile given patient does have a history of antibiotic usage recently.  Also ordered stool studies given patient's a dialysis patient will consider treatment if bacterial cause. ? ? ?Patient later then started reporting some right leg pain.  On review of records from her hospital admission back in February 09, 2022 she was actually admitted for concern for infective myositis and was admitted by myself.  That time she had some redness and firmness to her leg.  She reports that that is all resolved on the left leg but she is starting to have some right knee discomfort with ambulating.  Her doctor recently put her on some antibiotics just to cover for possible infection.  She completed a course of that at the end of March but still having some discomfort in the right knee.  I do not see any redness or warmth like I did previously to suggest any recurrent cellulitis or myositis.  I will get an x-ray of the knee as well as an ultrasound to evaluate for DVT.  She is got good distal pulse unlikely vascular issue.  Given her QTc prolongation I have held off on Zofran and will give some Ativan to help with the nausea. ? ?12:14 PM patient feeling better after the Ativan.  No current nausea.  We will trial p.o. challenge, suspect patient most likely had a gastroenteritis given vomiting and diarrhea.  Holding off on fluid due to patient being dialysis would not want to overflowed her.  Patient's right knee seems to be consistent with Baker's cyst versus some pseudo gout.  Her knee does not feel warm to touch, there is no effusion there is no difficulty ranging it.  She just reports pain with trying to ambulate on it.  Suspect that she has a combination of pseudogout versus Baker's  cyst.  Given patient's ESRD and diabetic we will start her on some oxycodone and have her follow-up with orthopedics outpatient.  We will give her a one-time dose here. ? ?Discussed the case with the renal coordinator who is working on getting her dialysis tomorrow given no indication for emergent dialysis today and she missed her dialysis session ? ?2:25 PM coordinator  has discussed with patient getting dialysis tomorrow.  Patient given cab voucher.  Patient is ambulatory with walker and Ace wrap and feels comfortable with discharge home and following up with orthopedics ? ?Review the database has had no recent fills of pain medication.  Patient is tolerating p.o. and feels comfortable with discharge home.  She understands what to look for given she had a prior history of issues with the left leg at this time I do not see any evidence of cellulitis and she feels comfortable with discharge home ? ?I considered admission for patient but given cardiac markers are stable, no pulmonary edema on chest x-ray, oxygen levels look good and patient is ambulatory with walker and pain medication and most likely has Baker's cyst patient feels comfortable with discharge home.  No evidence of sepsis. ? ? ?The patient is on the cardiac monitor to evaluate for evidence of arrhythmia and/or significant heart rate changes. ? ?FINAL CLINICAL IMPRESSION(S) / ED DIAGNOSES  ? ?Final diagnoses:  ?Baker cyst, right  ?ESRD (end stage renal disease) ([place])  ?Gastroenteritis  ? ? ? ?Rx / DC Orders  ? ?ED Discharge Orders   ? ?      Ordered  ?  oxyCODONE (ROXICODONE) 5 MG immediate release tablet  Every 6 hours PRN       ? 03/29/22 1427  ? ?  ?  ? ?  ? ? ? ?Note:  This document was prepared using Dragon voice recognition software and may include unintentional dictation errors. ?  ?Vanessa [place], MD ?03/29/22 1429 ? ?  ?Vanessa [place], MD ?03/29/22 1429 ? ?

## 2022-03-29 NOTE — Discharge Instructions (Addendum)
I suspect that you could have a viral gastroenteritis given the nausea vomiting and diarrhea.  Her abdomen was soft and nontender we discussed holding off on CT imaging.  For your right leg pain I suspect this is related to Baker's cyst or pseudogout.  We have started you on some oxycodone and you need to call the orthopedic doctor to make a follow-up appointment and return to the ER for develop fevers, worsening pain or any other concerns ? ? ? ? ?1.  No fracture or dislocation of the right knee. ?  ?2. Chondrocalcinosis of the femorotibial compartments, suggesting ?CPPD (pseudogout) without significant joint space height loss or ?osteophytosis. ?  ?3.  No knee joint effusion. ? ?IMPRESSION: ?1. No evidence of right lower extremity deep vein thrombosis. ?2. Right popliteal fossa Baker's cyst measures up to 4 cm in ?diameter. ?  ? ?Take oxycodone as prescribed. Do not drink alcohol, drive or participate in any other potentially dangerous activities while taking this medication as it may make you sleepy. Do not take this medication with any other sedating medications, either prescription or over-the-counter. If you were prescribed Percocet or Vicodin, do not take these with acetaminophen (Tylenol) as it is already contained within these medications. ? ?This medication is an opiate (or narcotic) pain medication and can be habit forming. Use it as little as possible to achieve adequate pain control. Do not use or use it with extreme caution if you have a history of opiate abuse or dependence. If you are on a pain contract with your primary care doctor or a pain specialist, be sure to let them know you were prescribed this medication today from the Emergency Department. This medication is intended for your use only - do not give any to anyone else and keep it in a secure place where nobody else, especially children, have access to it. ? ? ?  ?

## 2022-03-29 NOTE — Progress Notes (Signed)
Hemodialysis patient known at [place] MWF 11:15am. Patient missed treatment today due to ED visit but has been rescheduled at [facility] 4/11 at 11:30am.   ?

## 2022-03-30 DIAGNOSIS — Z992 Dependence on renal dialysis: Secondary | ICD-10-CM | POA: Diagnosis not present

## 2022-03-30 DIAGNOSIS — N186 End stage renal disease: Secondary | ICD-10-CM | POA: Diagnosis not present

## 2022-03-31 DIAGNOSIS — N2581 Secondary hyperparathyroidism of renal origin: Secondary | ICD-10-CM | POA: Diagnosis not present

## 2022-03-31 DIAGNOSIS — Z992 Dependence on renal dialysis: Secondary | ICD-10-CM | POA: Diagnosis not present

## 2022-03-31 DIAGNOSIS — N186 End stage renal disease: Secondary | ICD-10-CM | POA: Diagnosis not present

## 2022-04-01 ENCOUNTER — Emergency Department
Admission: EM | Admit: 2022-04-01 | Discharge: 2022-04-01 | Disposition: A | Discharge status: Home / Self Care | Payer: Medicare HMO | Attending: Emergency Medicine | Admitting: Emergency Medicine

## 2022-04-01 ENCOUNTER — Other Ambulatory Visit: Payer: Self-pay

## 2022-04-01 DIAGNOSIS — M79604 Pain in right leg: Secondary | ICD-10-CM | POA: Diagnosis not present

## 2022-04-01 DIAGNOSIS — J449 Chronic obstructive pulmonary disease, unspecified: Secondary | ICD-10-CM | POA: Diagnosis not present

## 2022-04-01 DIAGNOSIS — E1122 Type 2 diabetes mellitus with diabetic chronic kidney disease: Secondary | ICD-10-CM | POA: Diagnosis not present

## 2022-04-01 DIAGNOSIS — M11261 Other chondrocalcinosis, right knee: Secondary | ICD-10-CM | POA: Insufficient documentation

## 2022-04-01 DIAGNOSIS — R52 Pain, unspecified: Secondary | ICD-10-CM | POA: Diagnosis not present

## 2022-04-01 DIAGNOSIS — N186 End stage renal disease: Secondary | ICD-10-CM | POA: Insufficient documentation

## 2022-04-01 DIAGNOSIS — M25561 Pain in right knee: Secondary | ICD-10-CM | POA: Diagnosis present

## 2022-04-01 DIAGNOSIS — I10 Essential (primary) hypertension: Secondary | ICD-10-CM | POA: Diagnosis not present

## 2022-04-01 DIAGNOSIS — R5381 Other malaise: Secondary | ICD-10-CM | POA: Diagnosis not present

## 2022-04-01 DIAGNOSIS — D649 Anemia, unspecified: Secondary | ICD-10-CM | POA: Insufficient documentation

## 2022-04-01 DIAGNOSIS — Z992 Dependence on renal dialysis: Secondary | ICD-10-CM | POA: Diagnosis not present

## 2022-04-01 DIAGNOSIS — M1120 Other chondrocalcinosis, unspecified site: Secondary | ICD-10-CM

## 2022-04-01 DIAGNOSIS — I120 Hypertensive chronic kidney disease with stage 5 chronic kidney disease or end stage renal disease: Secondary | ICD-10-CM | POA: Insufficient documentation

## 2022-04-01 LAB — BASIC METABOLIC PANEL
Anion gap: 14 (ref 5–15)
BUN: 20 mg/dL (ref 6–20)
CO2: 25 mmol/L (ref 22–32)
Calcium: 8.8 mg/dL — ABNORMAL LOW (ref 8.9–10.3)
Chloride: 97 mmol/L — ABNORMAL LOW (ref 98–111)
Creatinine, Ser: 4.47 mg/dL — ABNORMAL HIGH (ref 0.44–1.00)
GFR, Estimated: 11 mL/min — ABNORMAL LOW (ref >60)
Glucose, Bld: 173 mg/dL — ABNORMAL HIGH (ref 70–99)
Potassium: 4 mmol/L (ref 3.5–5.1)
Sodium: 136 mmol/L (ref 135–145)

## 2022-04-01 LAB — CBC WITH DIFFERENTIAL/PLATELET
Abs Immature Granulocytes: 0.02 10*3/uL (ref 0.00–0.07)
Basophils Absolute: 0 10*3/uL (ref 0.0–0.1)
Basophils Relative: 1 %
Eosinophils Absolute: 0.1 10*3/uL (ref 0.0–0.5)
Eosinophils Relative: 1 %
HCT: 37 % (ref 36.0–46.0)
Hemoglobin: 11.4 g/dL — ABNORMAL LOW (ref 12.0–15.0)
Immature Granulocytes: 0 %
Lymphocytes Relative: 14 %
Lymphs Abs: 1.1 10*3/uL (ref 0.7–4.0)
MCH: 26.8 pg (ref 26.0–34.0)
MCHC: 30.8 g/dL (ref 30.0–36.0)
MCV: 87.1 fL (ref 80.0–100.0)
Monocytes Absolute: 0.8 10*3/uL (ref 0.1–1.0)
Monocytes Relative: 10 %
Neutro Abs: 5.9 10*3/uL (ref 1.7–7.7)
Neutrophils Relative %: 74 %
Platelets: 151 10*3/uL (ref 150–400)
RBC: 4.25 MIL/uL (ref 3.87–5.11)
RDW: 15.3 % (ref 11.5–15.5)
WBC: 7.9 10*3/uL (ref 4.0–10.5)
nRBC: 0 % (ref 0.0–0.2)

## 2022-04-01 LAB — MAGNESIUM: Magnesium: 1.8 mg/dL (ref 1.7–2.4)

## 2022-04-01 LAB — CK: Total CK: 159 U/L (ref 38–234)

## 2022-04-01 MED ORDER — OXYCODONE-ACETAMINOPHEN 5-325 MG PO TABS: 1.0000 | ORAL_TABLET | ORAL | 0 refills | Status: DC | PRN

## 2022-04-01 MED ORDER — OXYCODONE-ACETAMINOPHEN 5-325 MG PO TABS
1.0000 | ORAL_TABLET | Freq: Once | ORAL | Status: AC
  Administered 2022-04-01: 1 via ORAL
  Filled 2022-04-01: qty 1

## 2022-04-01 NOTE — ED Triage Notes (Signed)
Pt here via [facility] with right leg pain. Pt has a hx of cellulitis in her left leg, pain in the right leg started yesterday after dialysis. cbg was 182. Vitals wnl. ?

## 2022-04-01 NOTE — ED Provider Notes (Signed)
? ?[facility] ?Provider Note ? ? ? Event Date/Time  ? First MD Initiated Contact with Patient 04/01/22 1159   ?  (approximate) ? ? ?History  ? ?Chief Complaint ?Leg Pain ? ? ?HPI ? ?Terry Arroyo is a 61 y.o. female with past medical history of hypertension, hyperlipidemia, diabetes, CAD, COPD, and ESRD on HD (MWF) who presents to the ED complaining of leg pain.  Patient reports that she has been dealing with approximately 1 week of constant pain affecting her entire right leg.  She states that it starts in her thigh and moves down her leg, but is most severe in her right knee.  She denies any falls or trauma to the leg, has been able to walk on it but with significant pain.  She has not noticed any redness or rash, denies fever or swelling.  She was seen in the ED 2 days ago for the symptoms, when ultrasound showed no signs of DVT but did demonstrate Baker's cyst.  X-ray was also concerning for possible pseudogout.  Patient was discharged home with pain medication, which she states has been helping however she ran out.  She states that she went to her usual dialysis appointment yesterday and had a full run. ?  ? ? ?Physical Exam  ? ?Triage Vital Signs: ?ED Triage Vitals  ?Enc Vitals Group  ?   BP 04/01/22 1153 (!) 154/72  ?   Pulse Rate 04/01/22 1153 70  ?   Resp 04/01/22 1153 (!) 22  ?   Temp 04/01/22 1153 99.1 ?F (37.3 ?C)  ?   Temp Source 04/01/22 1153 Oral  ?   SpO2 04/01/22 1153 99 %  ?   Weight 04/01/22 1154 167 lb 15.9 oz (76.2 kg)  ?   Height 04/01/22 1154 '5\' 5"'$  (1.651 m)  ?   Head Circumference --   ?   Peak Flow --   ?   Pain Score 04/01/22 1154 10  ?   Pain Loc --   ?   Pain Edu? --   ?   Excl. in [place]? --   ? ? ?Most recent vital signs: ?Vitals:  ? 04/01/22 1153  ?BP: (!) 154/72  ?Pulse: 70  ?Resp: (!) 22  ?Temp: 99.1 ?F (37.3 ?C)  ?SpO2: 99%  ? ? ?Constitutional: Alert and oriented. ?Eyes: Conjunctivae are normal. ?Head: Atraumatic. ?Nose: No congestion/rhinnorhea. ?Mouth/Throat:  Mucous membranes are moist.  ?Cardiovascular: Normal rate, regular rhythm. Grossly normal heart sounds.  2+ DP pulses bilaterally.  Right upper extremity AV fistula with palpable thrill. ?Respiratory: Normal respiratory effort.  No retractions. Lungs CTAB. ?Gastrointestinal: Soft and nontender. No distention. ?Musculoskeletal: Tenderness to palpation noted diffusely to right knee with mild edema and warmth, no overlying erythema noted.  Pain with range of motion of right knee, no pain noted with range of motion of right hip or right ankle.  No calf edema, erythema, or tenderness. ?Neurologic:  Normal speech and language. No gross focal neurologic deficits are appreciated. ? ? ? ?ED Results / Procedures / Treatments  ? ?Labs ?(all labs ordered are listed, but only abnormal results are displayed) ?Labs Reviewed  ?CBC WITH DIFFERENTIAL/PLATELET - Abnormal; Notable for the following components:  ?    Result Value  ? Hemoglobin 11.4 (*)   ? All other components within normal limits  ?BASIC METABOLIC PANEL - Abnormal; Notable for the following components:  ? Chloride 97 (*)   ? Glucose, Bld 173 (*)   ?  Creatinine, Ser 4.47 (*)   ? Calcium 8.8 (*)   ? GFR, Estimated 11 (*)   ? All other components within normal limits  ?MAGNESIUM  ?CK  ? ? ? ?EKG ? ?ED ECG REPORT ?Tempie Hoist, the attending physician, personally viewed and interpreted this ECG. ? ? Date: 04/01/2022 ? EKG Time: 11:54 ? Rate: 68 ? Rhythm: normal sinus rhythm ? Axis: Normal ? Intervals: Borderline prolonged QT ? ST&T Change: None ? ? ?PROCEDURES: ? ?Critical Care performed: No ? ?Procedures ? ? ?MEDICATIONS ORDERED IN ED: ?Medications  ?oxyCODONE-acetaminophen (PERCOCET/ROXICET) 5-325 MG per tablet 1 tablet (1 tablet Oral Given 04/01/22 1237)  ? ? ? ?IMPRESSION / MDM / ASSESSMENT AND PLAN / ED COURSE  ?I reviewed the triage vital signs and the nursing notes. ?             ?               ? ?61 y.o. female with past medical history of hypertension,  hyperlipidemia, diabetes, CAD, COPD, and ESRD on HD (MWF) who presents to the ED with 1 week of pain throughout her entire right leg that is most severe at her right knee. ? ?Differential diagnosis includes, but is not limited to, septic arthritis, gouty arthritis, pseudogout, osteoarthritis, fracture, dislocation, DVT, arterial insufficiency, sciatica, cellulitis. ? ?Patient uncomfortable appearing but nontoxic and in no acute distress.  She is neurovascular intact distally to her right lower extremity and on exam pain appears to originate from her right knee.  Exam does not appear consistent with DVT and she had ultrasound 2 days ago that was negative for this, do not feel repeat ultrasound is indicated at this time.  She also had x-ray imaging at that time that was most suggestive of pseudogout, no acute bony injury noted and we will hold off on repeat x-ray given no new trauma.  There are no findings to suggest cellulitis although with mild edema and warmth noted to the right knee, symptoms seem consistent with pseudogout suggested by her prior x-ray.  She is able to range the knee and I doubt septic arthritis at this time.  Labs are reassuring, CBC with chronic anemia and no leukocytosis, BMP consistent with ESRD but with no acute electrolyte abnormality, CK level within normal limits.  Patient does state that she ran out of the previously prescribed pain medication, we will give dose of Percocet here in the ED.  She is appropriate for discharge home with outpatient follow-up with orthopedics or rheumatology, will be prescribed short course of pain medication and was counseled to return to the ED for new or worsening symptoms.  Patient agrees with plan. ? ?  ? ? ?FINAL CLINICAL IMPRESSION(S) / ED DIAGNOSES  ? ?Final diagnoses:  ?Right leg pain  ?Pseudogout  ? ? ? ?Rx / DC Orders  ? ?ED Discharge Orders   ? ?      Ordered  ?  oxyCODONE-acetaminophen (PERCOCET) 5-325 MG tablet  Every 4 hours PRN       ? 04/01/22  1314  ? ?  ?  ? ?  ? ? ? ?Note:  This document was prepared using Dragon voice recognition software and may include unintentional dictation errors. ?  ?Blake Divine, MD ?04/01/22 1314 ? ?

## 2022-04-01 NOTE — ED Notes (Signed)
This RN first encounter with pt prior to discharge. Pt verbalized understanding of discharge instructions, prescriptions, and follow-up care instructions. Pt advised if symptoms worsen to return to ED. E-signature not available due to e-signature pad not working.   ?

## 2022-04-05 DIAGNOSIS — Z992 Dependence on renal dialysis: Secondary | ICD-10-CM | POA: Diagnosis not present

## 2022-04-05 DIAGNOSIS — N186 End stage renal disease: Secondary | ICD-10-CM | POA: Diagnosis not present

## 2022-04-05 DIAGNOSIS — N2581 Secondary hyperparathyroidism of renal origin: Secondary | ICD-10-CM | POA: Diagnosis not present

## 2022-04-06 ENCOUNTER — Encounter: Payer: Self-pay | Admitting: *Deleted

## 2022-04-06 ENCOUNTER — Emergency Department
Admission: EM | Admit: 2022-04-06 | Discharge: 2022-04-07 | Disposition: A | Discharge status: Home / Self Care | Payer: Medicare HMO | Attending: Emergency Medicine | Admitting: Emergency Medicine

## 2022-04-06 ENCOUNTER — Other Ambulatory Visit: Payer: Self-pay

## 2022-04-06 ENCOUNTER — Emergency Department: Payer: Medicare HMO

## 2022-04-06 DIAGNOSIS — I10 Essential (primary) hypertension: Secondary | ICD-10-CM | POA: Diagnosis not present

## 2022-04-06 DIAGNOSIS — N186 End stage renal disease: Secondary | ICD-10-CM | POA: Diagnosis not present

## 2022-04-06 DIAGNOSIS — Z992 Dependence on renal dialysis: Secondary | ICD-10-CM | POA: Diagnosis not present

## 2022-04-06 DIAGNOSIS — M25561 Pain in right knee: Secondary | ICD-10-CM | POA: Diagnosis present

## 2022-04-06 DIAGNOSIS — M7121 Synovial cyst of popliteal space [Baker], right knee: Secondary | ICD-10-CM | POA: Diagnosis not present

## 2022-04-06 DIAGNOSIS — R52 Pain, unspecified: Secondary | ICD-10-CM | POA: Diagnosis not present

## 2022-04-06 DIAGNOSIS — M25461 Effusion, right knee: Secondary | ICD-10-CM | POA: Diagnosis not present

## 2022-04-06 DIAGNOSIS — M11261 Other chondrocalcinosis, right knee: Secondary | ICD-10-CM | POA: Diagnosis not present

## 2022-04-06 LAB — CBC
HCT: 37 % (ref 36.0–46.0)
Hemoglobin: 11.1 g/dL — ABNORMAL LOW (ref 12.0–15.0)
MCH: 26.5 pg (ref 26.0–34.0)
MCHC: 30 g/dL (ref 30.0–36.0)
MCV: 88.3 fL (ref 80.0–100.0)
Platelets: 238 10*3/uL (ref 150–400)
RBC: 4.19 MIL/uL (ref 3.87–5.11)
RDW: 15.4 % (ref 11.5–15.5)
WBC: 6.5 10*3/uL (ref 4.0–10.5)
nRBC: 0 % (ref 0.0–0.2)

## 2022-04-06 LAB — URIC ACID: Uric Acid, Serum: 4.9 mg/dL (ref 2.5–7.1)

## 2022-04-06 LAB — BASIC METABOLIC PANEL
Anion gap: 13 (ref 5–15)
BUN: 29 mg/dL — ABNORMAL HIGH (ref 6–20)
CO2: 29 mmol/L (ref 22–32)
Calcium: 8.9 mg/dL (ref 8.9–10.3)
Chloride: 94 mmol/L — ABNORMAL LOW (ref 98–111)
Creatinine, Ser: 6.48 mg/dL — ABNORMAL HIGH (ref 0.44–1.00)
GFR, Estimated: 7 mL/min — ABNORMAL LOW (ref >60)
Glucose, Bld: 129 mg/dL — ABNORMAL HIGH (ref 70–99)
Potassium: 4 mmol/L (ref 3.5–5.1)
Sodium: 136 mmol/L (ref 135–145)

## 2022-04-06 IMAGING — US US EXTREM LOW VENOUS*R*
1 series · 13 of 24 positions shown · non-contrast
Comparison: Recent similar study [DATE].

CLINICAL DATA: Right lower extremity pain.

EXAM:
RIGHT LOWER EXTREMITY VENOUS DOPPLER ULTRASOUND
TECHNIQUE: Gray-scale sonography with compression, as well as color and duplex
ultrasound, were performed to evaluate the deep venous system(s)
from the level of the common femoral vein through the popliteal and
proximal calf veins.

[Series 1: us venous img lower uni right (dvt) · portal-venous · 13 of 40 slices shown]
[im 1/40]
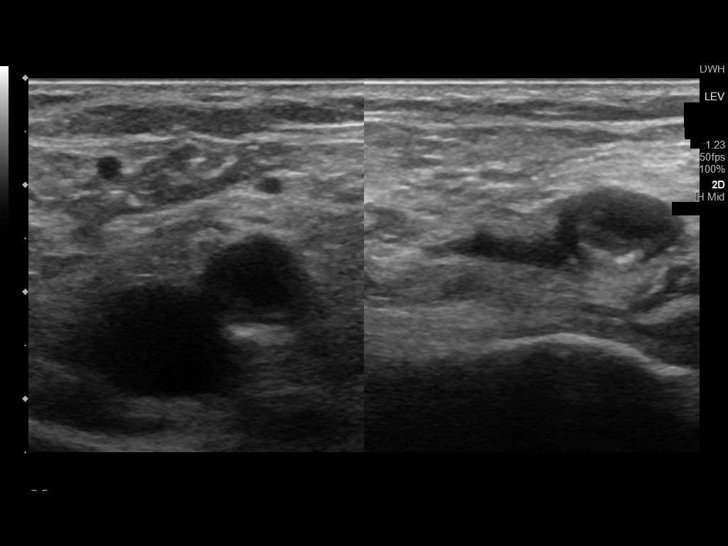
[im 4/40]
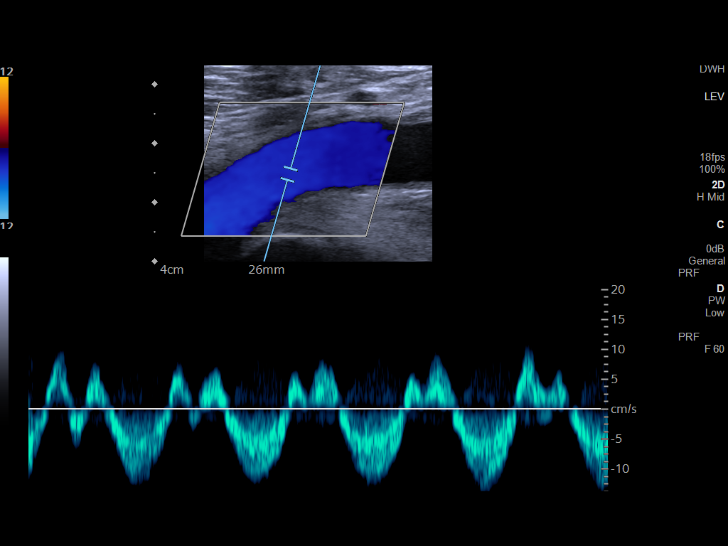
[im 7/40]
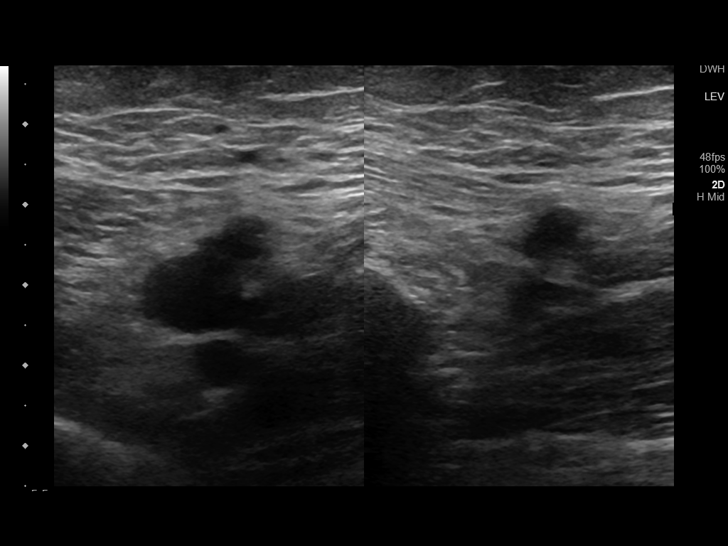
[im 11/40]
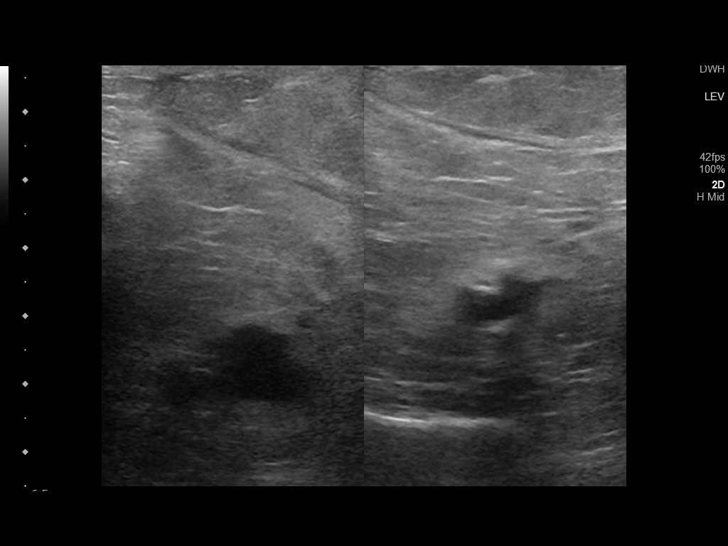
[im 14/40]
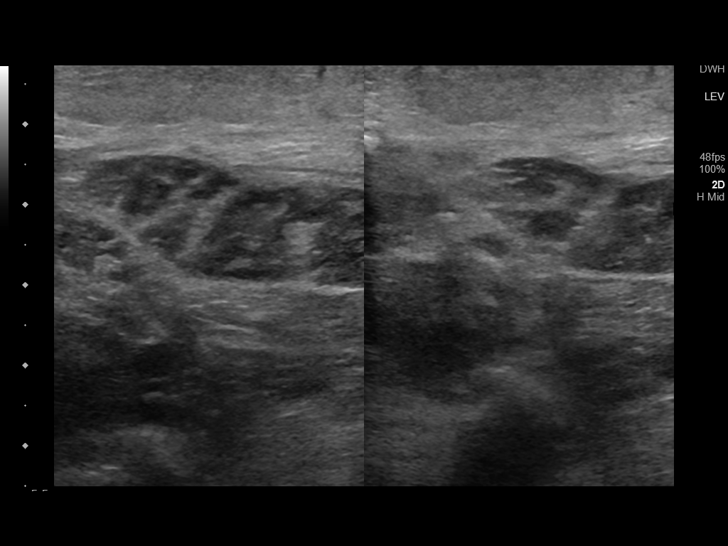
[im 17/40]
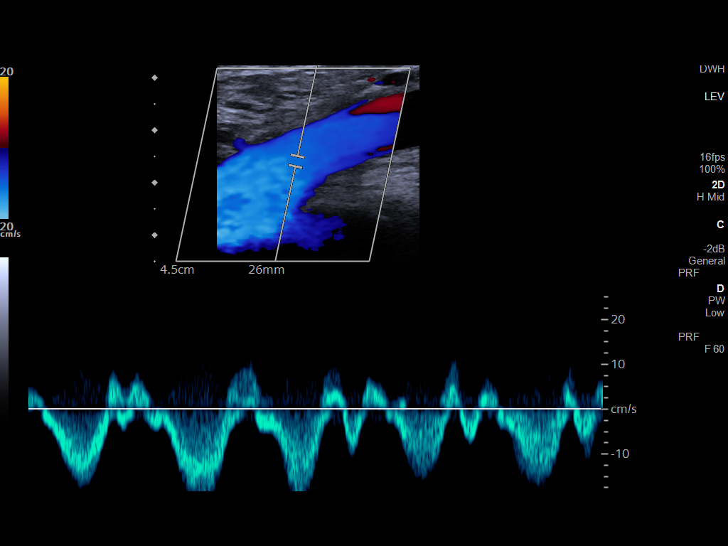
[im 21/40]
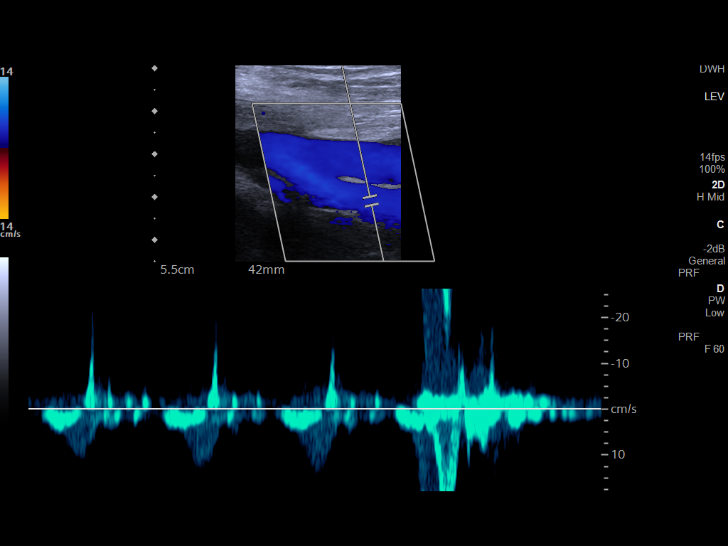
[im 23/40]
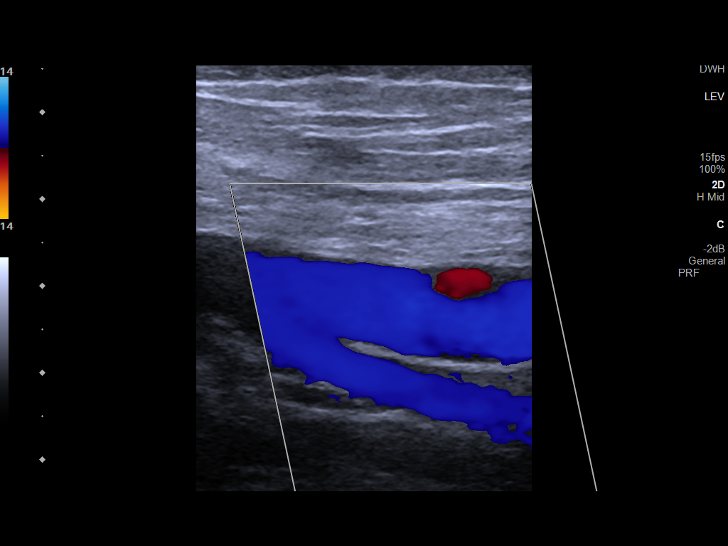
[im 26/40]
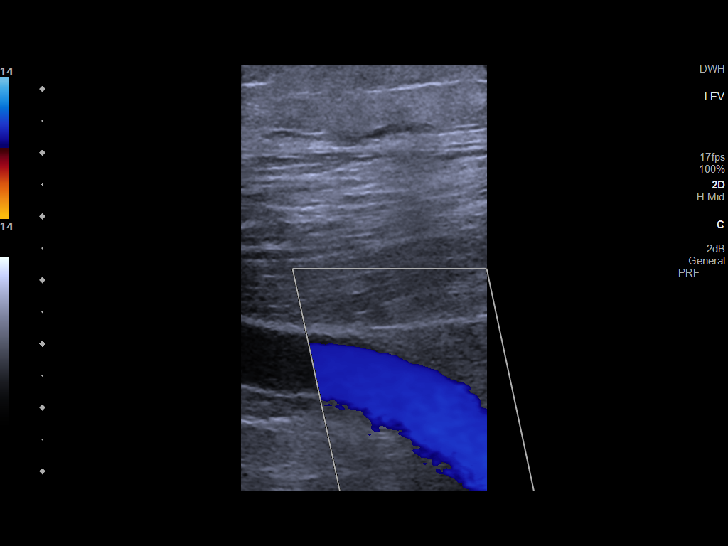
[im 29/40]
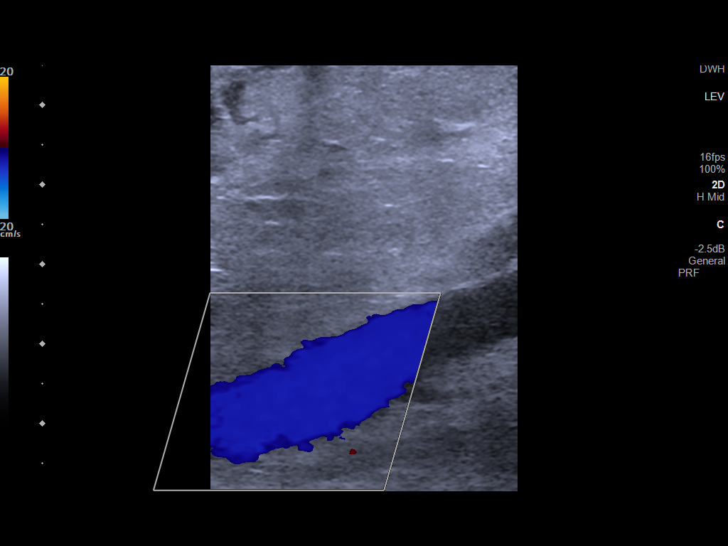
[im 33/40]
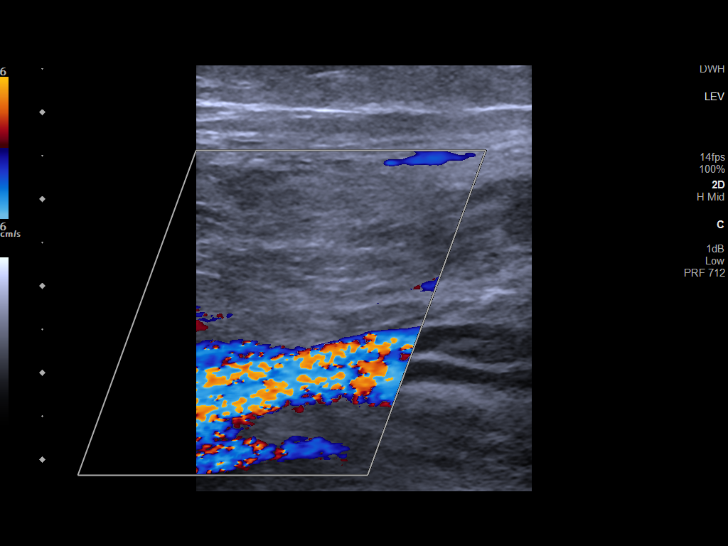
[im 36/40]
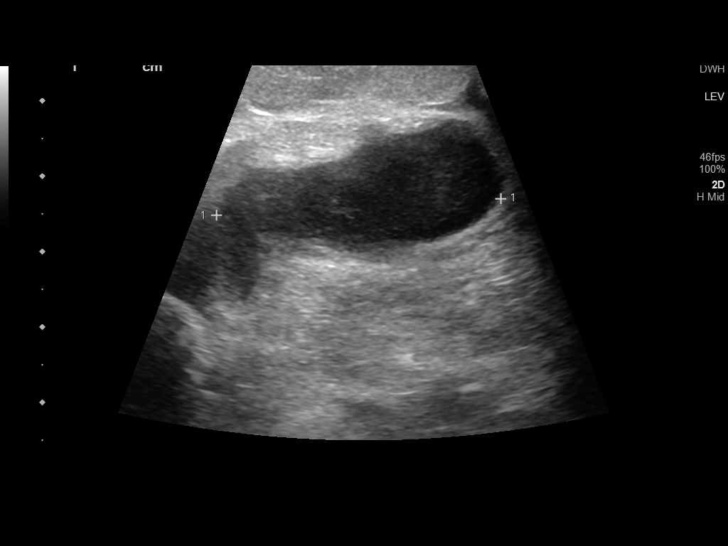
[im 40/40]
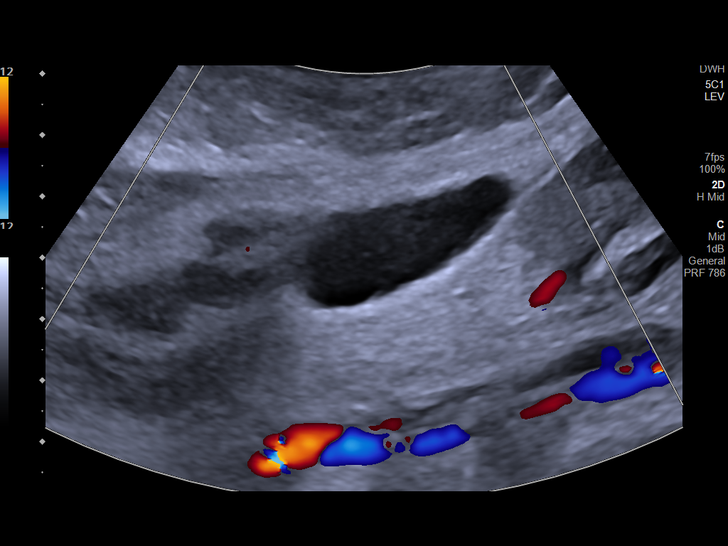

[13 of 24 positions shown; findings below may reference images not displayed]

FINDINGS: VENOUS

Normal compressibility of the common femoral, superficial femoral,
and popliteal veins, as well as the visualized calf veins.
Visualized portions of profunda femoral vein and great saphenous
vein unremarkable. No filling defects to suggest DVT on grayscale or
color Doppler imaging. Doppler waveforms show normal direction of
venous flow, normal respiratory plasticity and response to
augmentation.

Limited views of the contralateral common femoral vein are
unremarkable.

OTHER

Right popliteal fossa Baker's cyst is again noted, previously 4.0 x
1.3 x 2.5 cm, larger today with measurements of 7.9 x 1.5 x 3.8 cm
and with greater quantity of hypoechoic debris within the cyst than
previously. There is no internal color flow.

Limitations: none
IMPRESSION: 1. No evidence of deep venous thrombosis in the right lower
extremity.
2. Enlarging Baker's cyst with increasing hypoechoic debris. This
may be seen with synovitis in the right knee joint space proper.
Please correlate clinically. The recent right knee series of
[DATE] demonstrated meniscal chondrocalcinosis suggesting CPPD
or pseudogout, but only showed minimal suprapatellar bursal
effusion.

## 2022-04-06 MED ORDER — OXYCODONE-ACETAMINOPHEN 5-325 MG PO TABS
1.0000 | ORAL_TABLET | Freq: Four times a day (QID) | ORAL | 0 refills | Status: DC | PRN
Start: 2022-04-06 — End: 2022-08-05

## 2022-04-06 MED ORDER — OXYCODONE-ACETAMINOPHEN 5-325 MG PO TABS
1.0000 | ORAL_TABLET | Freq: Once | ORAL | Status: AC
  Administered 2022-04-06: 1 via ORAL
  Filled 2022-04-06: qty 1

## 2022-04-06 MED ORDER — PREDNISONE 50 MG PO TABS: 50.0000 mg | ORAL_TABLET | Freq: Every day | ORAL | 0 refills | Status: DC

## 2022-04-06 MED ORDER — DEXAMETHASONE SODIUM PHOSPHATE 10 MG/ML IJ SOLN
10.0000 mg | Freq: Once | INTRAMUSCULAR | Status: AC
  Administered 2022-04-06: 10 mg via INTRAMUSCULAR
  Filled 2022-04-06: qty 1

## 2022-04-06 MED ORDER — HYDROMORPHONE HCL 1 MG/ML IJ SOLN
1.0000 mg | Freq: Once | INTRAMUSCULAR | Status: AC
  Administered 2022-04-06: 1 mg via INTRAMUSCULAR
  Filled 2022-04-06: qty 1

## 2022-04-06 NOTE — ED Provider Notes (Signed)
? ?[facility] ?Provider Note ? ?Patient Contact: 9:45 PM (approximate) ? ? ?History  ? ?Leg Pain ? ? ?HPI ? ?Terry Arroyo is a 61 y.o. female who presents the emergency department complaining of worsening right knee pain.  Patient has been seen twice recently with knee pain.  She has been diagnosed with a Bakers cyst.  She is having worsening pain.  And is scheduled to see orthopedics in 2 days.  She ran out of her pain medication and did not have any more to take.  She is an end-stage renal dialysis patient receiving Monday, Wednesday, Friday dialysis and has not missed any treatments.  She has no other complaints such as fevers or chills, chest pain, shortness of breath. ?  ? ? ?Physical Exam  ? ?Triage Vital Signs: ?ED Triage Vitals  ?Enc Vitals Group  ?   BP 04/06/22 1923 (!) 157/76  ?   Pulse Rate 04/06/22 1923 66  ?   Resp 04/06/22 1923 18  ?   Temp 04/06/22 1923 98.9 ?F (37.2 ?C)  ?   Temp Source 04/06/22 1923 Oral  ?   SpO2 04/06/22 1923 94 %  ?   Weight 04/06/22 1923 168 lb (76.2 kg)  ?   Height 04/06/22 1923 '5\' 6"'$  (1.676 m)  ?   Head Circumference --   ?   Peak Flow --   ?   Pain Score 04/06/22 1930 8  ?   Pain Loc --   ?   Pain Edu? --   ?   Excl. in [place]? --   ? ? ?Most recent vital signs: ?Vitals:  ? 04/06/22 1923  ?BP: (!) 157/76  ?Pulse: 66  ?Resp: 18  ?Temp: 98.9 ?F (37.2 ?C)  ?SpO2: 94%  ? ? ? ?General: Alert and in no acute distress.  ?Cardiovascular:  Good peripheral perfusion ?Respiratory: Normal respiratory effort without tachypnea or retractions. Lungs CTAB.  ?Musculoskeletal: Full range of motion to all extremities.  Visualization of the right knee reveals edema when compared to left.  She is primarily tender to palpation along the posterior fossa.  There is some mild anterior to palpation with deep palpation.  Right knee is slightly warm when compared with left but not grossly hot.  No overlying skin changes or gross erythema.  Limited range of motion due to pain.   Examination of the hip and ankle is unremarkable.  Dorsalis pedis pulses sensation intact distally. ?Neurologic:  No gross focal neurologic deficits are appreciated.  ?Skin:   No rash noted ?Other: ? ? ?ED Results / Procedures / Treatments  ? ?Labs ?(all labs ordered are listed, but only abnormal results are displayed) ?Labs Reviewed  ?BASIC METABOLIC PANEL - Abnormal; Notable for the following components:  ?    Result Value  ? Chloride 94 (*)   ? Glucose, Bld 129 (*)   ? BUN 29 (*)   ? Creatinine, Ser 6.48 (*)   ? GFR, Estimated 7 (*)   ? All other components within normal limits  ?CBC - Abnormal; Notable for the following components:  ? Hemoglobin 11.1 (*)   ? All other components within normal limits  ?URIC ACID  ? ? ? ?EKG ? ? ? ? ?RADIOLOGY ? ?I personally viewed and evaluated these images as part of my medical decision making, as well as reviewing the written report by the radiologist. ? ?ED Provider Interpretation: Ultrasound reveals no evidence of DVT.  There is an enlarging Baker's cyst  when compared with previous ultrasound from 8 days ago.  Recent knee series from 0/09/2022 demonstrates findings concerning for CPPD/pseudogout ? ?US Venous Img Lower Unilateral Right ? ?Result Date: 04/06/2022 ?CLINICAL DATA:  Right lower extremity pain. EXAM: RIGHT LOWER EXTREMITY VENOUS DOPPLER ULTRASOUND TECHNIQUE: Gray-scale sonography with compression, as well as color and duplex ultrasound, were performed to evaluate the deep venous system(s) from the level of the common femoral vein through the popliteal and proximal calf veins. COMPARISON:  Recent similar study 03/29/2022. FINDINGS: VENOUS Normal compressibility of the common femoral, superficial femoral, and popliteal veins, as well as the visualized calf veins. Visualized portions of profunda femoral vein and great saphenous vein unremarkable. No filling defects to suggest DVT on grayscale or color Doppler imaging. Doppler waveforms show normal direction of venous  flow, normal respiratory plasticity and response to augmentation. Limited views of the contralateral common femoral vein are unremarkable. OTHER Right popliteal fossa Baker's cyst is again noted, previously 4.0 x 1.3 x 2.5 cm, larger today with measurements of 7.9 x 1.5 x 3.8 cm and with greater quantity of hypoechoic debris within the cyst than previously. There is no internal color flow. Limitations: none IMPRESSION: 1. No evidence of deep venous thrombosis in the right lower extremity. 2. Enlarging Baker's cyst with increasing hypoechoic debris. This may be seen with synovitis in the right knee joint space proper. Please correlate clinically. The recent right knee series of 03/29/2022 demonstrated meniscal chondrocalcinosis suggesting CPPD or pseudogout, but only showed minimal suprapatellar bursal effusion. Electronically Signed   By: Telford Nab M.D.   On: 04/06/2022 23:07   ? ?PROCEDURES: ? ?Critical Care performed: No ? ?Procedures ? ? ?MEDICATIONS ORDERED IN ED: ?Medications  ?HYDROmorphone (DILAUDID) injection 1 mg (has no administration in time range)  ?dexamethasone (DECADRON) injection 10 mg (has no administration in time range)  ?oxyCODONE-acetaminophen (PERCOCET/ROXICET) 5-325 MG per tablet 1 tablet (1 tablet Oral Given 04/06/22 2150)  ? ? ? ?IMPRESSION / MDM / ASSESSMENT AND PLAN / ED COURSE  ?I reviewed the triage vital signs and the nursing notes. ?             ?               ? ?Differential diagnosis includes, but is not limited to, worsening Baker's cyst, DVT, septic joint, gout, fracture ? ? ?Patient's diagnosis is consistent with worsening Baker's cyst, possible CPPD.  Patient presented to the emergency department with a complaint of worsening right knee pain.  She has an appointment with orthopedics in 2 days.  Patient ran out of pain medication and is having increasing pain.  Patient had repeat ultrasound to evaluate the area.  Compared to previous ultrasound from 8 days ago patient has an  enlarging Baker's cyst.  There is also findings that can be consistent with CPPD/pseudogout.  Given the worsening pain, growing size of Baker's cyst with findings concerning for possible gout I will place the patient on steroids and refill the patient's pain medications for her.  Physical exam and CBC are unconcerning at this time for septic arthritis.  I will not perform arthrocentesis at this time as again I have a low suspicion for septic arthritis.  Uric acid level is running at this time.  Again I will treat the patient with prednisone regardless of uric acid level given the worsening/enlarging Baker's cyst.  Return precautions discussed with the patient.  She has follow-up in 2 days with orthopedics..Patient is given ED precautions to return to  the ED for any worsening or new symptoms. ? ? ? ?  ? ? ?FINAL CLINICAL IMPRESSION(S) / ED DIAGNOSES  ? ?Final diagnoses:  ?Baker cyst, right  ? ? ? ?Rx / DC Orders  ? ?ED Discharge Orders   ? ?      Ordered  ?  predniSONE (DELTASONE) 50 MG tablet  Daily with breakfast       ? 04/06/22 2336  ?  oxyCODONE-acetaminophen (PERCOCET/ROXICET) 5-325 MG tablet  Every 6 hours PRN       ? 04/06/22 2336  ? ?  ?  ? ?  ? ? ? ?Note:  This document was prepared using Dragon voice recognition software and may include unintentional dictation errors. ?  ?Darletta Moll, PA-C ?04/06/22 2341 ? ?  ?Harvest Dark, MD ?04/09/22 1406 ? ?

## 2022-04-06 NOTE — ED Notes (Signed)
Pt provided a pts phone to contact her son to provide her a ride home.  ?

## 2022-04-06 NOTE — ED Triage Notes (Addendum)
Pt brought in via ems from home.  Pt has right leg for 2 days.  No known injury.  Pt states she has a cyst behind right knee.  Pt also reports swelling of right knee.  Pt alert  speech clear.  Pt has acewrap on right leg/knee ?

## 2022-04-07 ENCOUNTER — Ambulatory Visit: Payer: Self-pay

## 2022-04-07 DIAGNOSIS — N2581 Secondary hyperparathyroidism of renal origin: Secondary | ICD-10-CM | POA: Diagnosis not present

## 2022-04-07 DIAGNOSIS — N186 End stage renal disease: Secondary | ICD-10-CM | POA: Diagnosis not present

## 2022-04-07 DIAGNOSIS — Z992 Dependence on renal dialysis: Secondary | ICD-10-CM | POA: Diagnosis not present

## 2022-04-07 NOTE — ED Notes (Signed)
Multiple family members have been contacted to provide the pt a ride - all have declined. Pt was escorted to the lobby via wheelchair and provided a walker and educated on its use - verbalized understanding and shown there the pt phone is located and educated on its use. ?

## 2022-04-07 NOTE — Telephone Encounter (Signed)
Pt just called to ask how long the shot she received for her knee pain would help. She is not in one of our practices and she states she sees the orthopedic doctor tomorrow. I advised her to ask the orthopedic doctor tomorrow. ?

## 2022-04-07 NOTE — ED Notes (Signed)
E-signature pad unavailable - Pt verbalized understanding of D/C information - no additional concerns at this time.  

## 2022-04-07 NOTE — Telephone Encounter (Signed)
Patient called, left VM to return the call to the office to discuss symptoms with a nurse. ? ?Summary: cortizone shot question  ? Pt was prescribed Oxycodone and prednisone from the hospital  as well as cortizone shots / pt wants to know how long does the cortizone she was given in her legs last? / please advise   ?  ? ?

## 2022-04-07 NOTE — Telephone Encounter (Signed)
Message left on voice mail.

## 2022-04-08 DIAGNOSIS — M1711 Unilateral primary osteoarthritis, right knee: Secondary | ICD-10-CM | POA: Diagnosis not present

## 2022-04-09 DIAGNOSIS — Z992 Dependence on renal dialysis: Secondary | ICD-10-CM | POA: Diagnosis not present

## 2022-04-09 DIAGNOSIS — N186 End stage renal disease: Secondary | ICD-10-CM | POA: Diagnosis not present

## 2022-04-09 DIAGNOSIS — N2581 Secondary hyperparathyroidism of renal origin: Secondary | ICD-10-CM | POA: Diagnosis not present

## 2022-04-12 DIAGNOSIS — E1122 Type 2 diabetes mellitus with diabetic chronic kidney disease: Secondary | ICD-10-CM | POA: Diagnosis not present

## 2022-04-12 DIAGNOSIS — Z992 Dependence on renal dialysis: Secondary | ICD-10-CM | POA: Diagnosis not present

## 2022-04-12 DIAGNOSIS — N2581 Secondary hyperparathyroidism of renal origin: Secondary | ICD-10-CM | POA: Diagnosis not present

## 2022-04-12 DIAGNOSIS — N186 End stage renal disease: Secondary | ICD-10-CM | POA: Diagnosis not present

## 2022-04-13 ENCOUNTER — Other Ambulatory Visit
Admission: RE | Admit: 2022-04-13 | Discharge: 2022-04-13 | Disposition: A | Discharge status: Home / Self Care | Payer: Medicare HMO | Source: Ambulatory Visit | Attending: Sports Medicine | Admitting: Sports Medicine

## 2022-04-13 DIAGNOSIS — M11261 Other chondrocalcinosis, right knee: Secondary | ICD-10-CM | POA: Diagnosis not present

## 2022-04-13 DIAGNOSIS — M7121 Synovial cyst of popliteal space [Baker], right knee: Secondary | ICD-10-CM | POA: Diagnosis not present

## 2022-04-13 DIAGNOSIS — M25461 Effusion, right knee: Secondary | ICD-10-CM | POA: Diagnosis not present

## 2022-04-13 DIAGNOSIS — M1711 Unilateral primary osteoarthritis, right knee: Secondary | ICD-10-CM | POA: Insufficient documentation

## 2022-04-13 DIAGNOSIS — M79604 Pain in right leg: Secondary | ICD-10-CM | POA: Diagnosis not present

## 2022-04-13 LAB — SYNOVIAL CELL COUNT + DIFF, W/ CRYSTALS
Eosinophils-Synovial: 0 %
Lymphocytes-Synovial Fld: 30 %
Monocyte-Macrophage-Synovial Fluid: 61 %
Neutrophil, Synovial: 9 %
WBC, Synovial: 309 /mm3 — ABNORMAL HIGH (ref 0–200)

## 2022-04-14 ENCOUNTER — Emergency Department: Payer: Medicare HMO

## 2022-04-14 ENCOUNTER — Observation Stay
Admission: EM | Admit: 2022-04-14 | Discharge: 2022-04-16 | Disposition: A | Discharge status: Home Health | Payer: Medicare HMO | Attending: Student in an Organized Health Care Education/Training Program | Admitting: Student in an Organized Health Care Education/Training Program

## 2022-04-14 DIAGNOSIS — R197 Diarrhea, unspecified: Secondary | ICD-10-CM

## 2022-04-14 DIAGNOSIS — Z992 Dependence on renal dialysis: Secondary | ICD-10-CM | POA: Insufficient documentation

## 2022-04-14 DIAGNOSIS — S80919A Unspecified superficial injury of unspecified knee, initial encounter: Secondary | ICD-10-CM | POA: Diagnosis not present

## 2022-04-14 DIAGNOSIS — I132 Hypertensive heart and chronic kidney disease with heart failure and with stage 5 chronic kidney disease, or end stage renal disease: Secondary | ICD-10-CM | POA: Diagnosis not present

## 2022-04-14 DIAGNOSIS — Z79899 Other long term (current) drug therapy: Secondary | ICD-10-CM | POA: Insufficient documentation

## 2022-04-14 DIAGNOSIS — R7989 Other specified abnormal findings of blood chemistry: Secondary | ICD-10-CM | POA: Diagnosis not present

## 2022-04-14 DIAGNOSIS — K219 Gastro-esophageal reflux disease without esophagitis: Secondary | ICD-10-CM

## 2022-04-14 DIAGNOSIS — Z87891 Personal history of nicotine dependence: Secondary | ICD-10-CM | POA: Diagnosis not present

## 2022-04-14 DIAGNOSIS — J449 Chronic obstructive pulmonary disease, unspecified: Secondary | ICD-10-CM | POA: Insufficient documentation

## 2022-04-14 DIAGNOSIS — E1122 Type 2 diabetes mellitus with diabetic chronic kidney disease: Secondary | ICD-10-CM | POA: Diagnosis not present

## 2022-04-14 DIAGNOSIS — R1084 Generalized abdominal pain: Secondary | ICD-10-CM | POA: Diagnosis not present

## 2022-04-14 DIAGNOSIS — N186 End stage renal disease: Secondary | ICD-10-CM | POA: Insufficient documentation

## 2022-04-14 DIAGNOSIS — I2510 Atherosclerotic heart disease of native coronary artery without angina pectoris: Secondary | ICD-10-CM | POA: Diagnosis not present

## 2022-04-14 DIAGNOSIS — E119 Type 2 diabetes mellitus without complications: Secondary | ICD-10-CM | POA: Insufficient documentation

## 2022-04-14 DIAGNOSIS — E785 Hyperlipidemia, unspecified: Secondary | ICD-10-CM

## 2022-04-14 DIAGNOSIS — M25561 Pain in right knee: Secondary | ICD-10-CM | POA: Diagnosis not present

## 2022-04-14 DIAGNOSIS — I10 Essential (primary) hypertension: Secondary | ICD-10-CM | POA: Diagnosis not present

## 2022-04-14 DIAGNOSIS — R112 Nausea with vomiting, unspecified: Secondary | ICD-10-CM | POA: Diagnosis not present

## 2022-04-14 DIAGNOSIS — I5022 Chronic systolic (congestive) heart failure: Secondary | ICD-10-CM | POA: Diagnosis not present

## 2022-04-14 DIAGNOSIS — I252 Old myocardial infarction: Secondary | ICD-10-CM | POA: Diagnosis not present

## 2022-04-14 DIAGNOSIS — Z951 Presence of aortocoronary bypass graft: Secondary | ICD-10-CM | POA: Insufficient documentation

## 2022-04-14 DIAGNOSIS — Z794 Long term (current) use of insulin: Secondary | ICD-10-CM | POA: Diagnosis not present

## 2022-04-14 DIAGNOSIS — R799 Abnormal finding of blood chemistry, unspecified: Secondary | ICD-10-CM | POA: Diagnosis not present

## 2022-04-14 DIAGNOSIS — Z7982 Long term (current) use of aspirin: Secondary | ICD-10-CM | POA: Diagnosis not present

## 2022-04-14 DIAGNOSIS — W1839XA Other fall on same level, initial encounter: Secondary | ICD-10-CM | POA: Diagnosis not present

## 2022-04-14 DIAGNOSIS — R739 Hyperglycemia, unspecified: Secondary | ICD-10-CM | POA: Diagnosis not present

## 2022-04-14 DIAGNOSIS — Z8673 Personal history of transient ischemic attack (TIA), and cerebral infarction without residual deficits: Secondary | ICD-10-CM | POA: Diagnosis not present

## 2022-04-14 DIAGNOSIS — J45909 Unspecified asthma, uncomplicated: Secondary | ICD-10-CM | POA: Diagnosis not present

## 2022-04-14 DIAGNOSIS — R531 Weakness: Secondary | ICD-10-CM | POA: Diagnosis not present

## 2022-04-14 DIAGNOSIS — E1165 Type 2 diabetes mellitus with hyperglycemia: Secondary | ICD-10-CM | POA: Insufficient documentation

## 2022-04-14 LAB — CBC
HCT: 35.5 % — ABNORMAL LOW (ref 36.0–46.0)
Hemoglobin: 11 g/dL — ABNORMAL LOW (ref 12.0–15.0)
MCH: 26.9 pg (ref 26.0–34.0)
MCHC: 31 g/dL (ref 30.0–36.0)
MCV: 86.8 fL (ref 80.0–100.0)
Platelets: 197 10*3/uL (ref 150–400)
RBC: 4.09 MIL/uL (ref 3.87–5.11)
RDW: 16.8 % — ABNORMAL HIGH (ref 11.5–15.5)
WBC: 9.5 10*3/uL (ref 4.0–10.5)
nRBC: 0 % (ref 0.0–0.2)

## 2022-04-14 LAB — COMPREHENSIVE METABOLIC PANEL
ALT: 20 U/L (ref 0–44)
AST: 17 U/L (ref 15–41)
Albumin: 3.5 g/dL (ref 3.5–5.0)
Alkaline Phosphatase: 86 U/L (ref 38–126)
Anion gap: 15 (ref 5–15)
BUN: 69 mg/dL — ABNORMAL HIGH (ref 6–20)
CO2: 25 mmol/L (ref 22–32)
Calcium: 8.7 mg/dL — ABNORMAL LOW (ref 8.9–10.3)
Chloride: 92 mmol/L — ABNORMAL LOW (ref 98–111)
Creatinine, Ser: 6.22 mg/dL — ABNORMAL HIGH (ref 0.44–1.00)
GFR, Estimated: 7 mL/min — ABNORMAL LOW (ref >60)
Glucose, Bld: 320 mg/dL — ABNORMAL HIGH (ref 70–99)
Potassium: 4.6 mmol/L (ref 3.5–5.1)
Sodium: 132 mmol/L — ABNORMAL LOW (ref 135–145)
Total Bilirubin: 0.9 mg/dL (ref 0.3–1.2)
Total Protein: 7.5 g/dL (ref 6.5–8.1)

## 2022-04-14 LAB — HEMOGLOBIN AND HEMATOCRIT, BLOOD
HCT: 36.5 % (ref 36.0–46.0)
Hemoglobin: 11.7 g/dL — ABNORMAL LOW (ref 12.0–15.0)

## 2022-04-14 LAB — TROPONIN I (HIGH SENSITIVITY): Troponin I (High Sensitivity): 46 ng/L — ABNORMAL HIGH (ref <18)

## 2022-04-14 IMAGING — CR DG KNEE COMPLETE 4+V*R*
4 series · 5 of 5 positions shown · non-contrast
Comparison: None.

CLINICAL DATA: RIGHT knee pain.

EXAM:
RIGHT KNEE - COMPLETE 4+ VIEW

[knee ap]
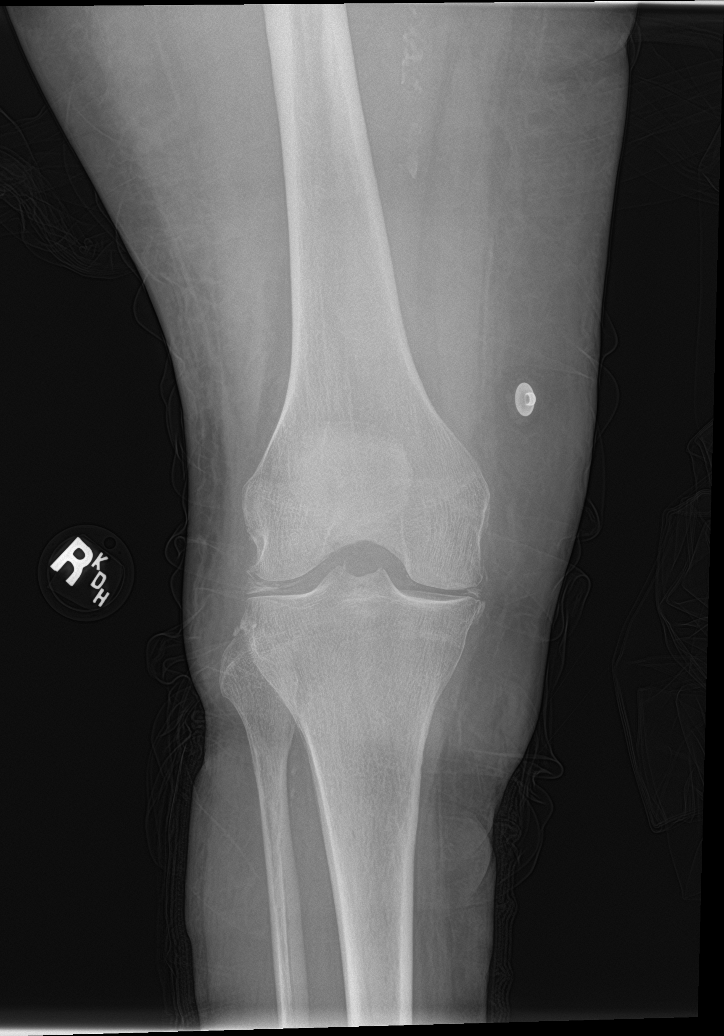

[knee obl (1 of 2)]
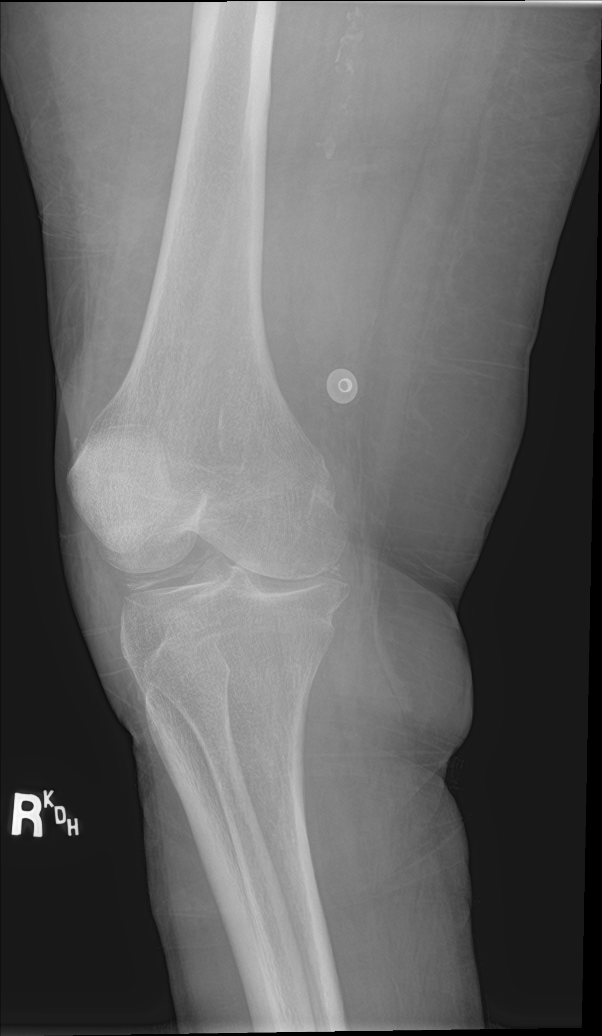

[knee obl (2 of 2)]
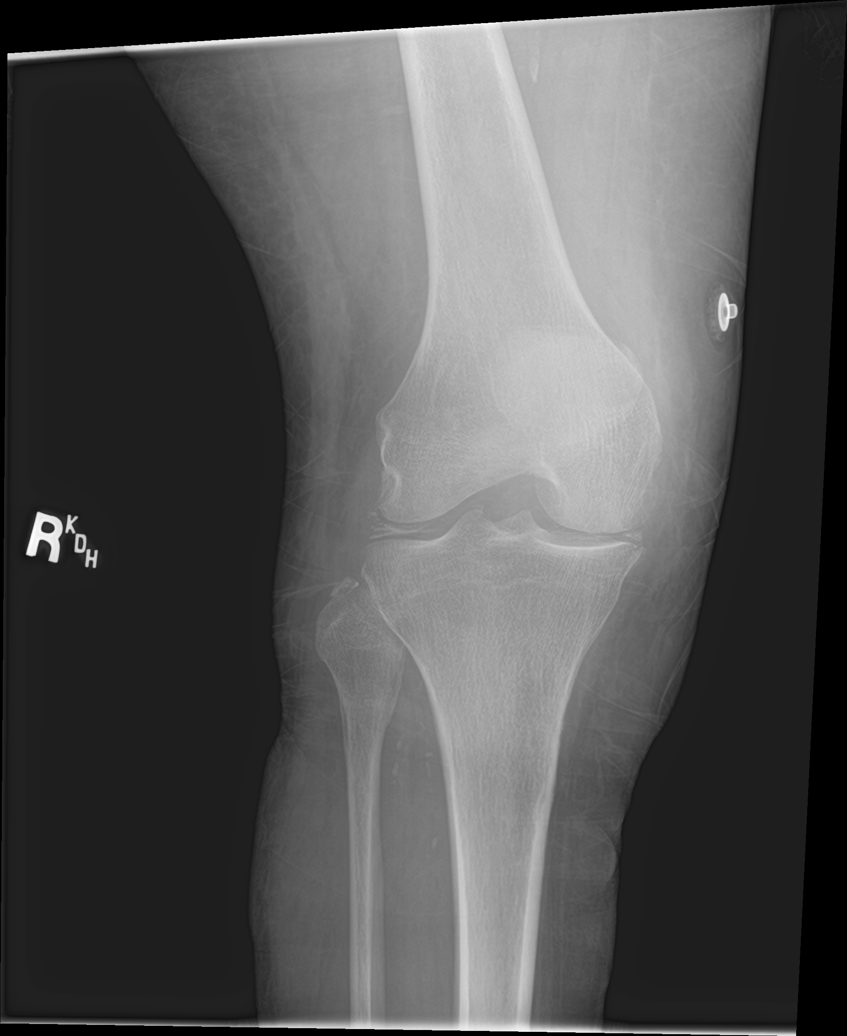

[Series 4: knee lat · 0.14mm/px · 2 of 2 slices shown]
[im 1/2]
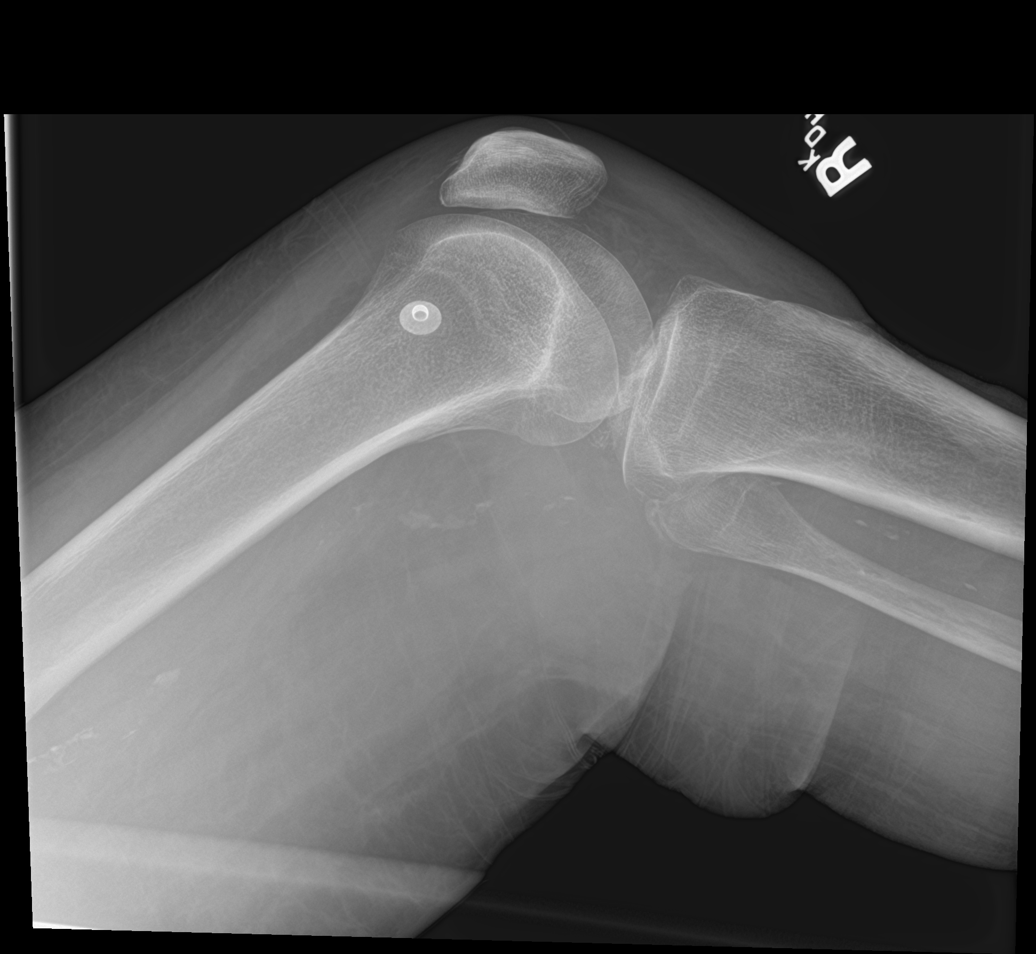
[im 2/2]
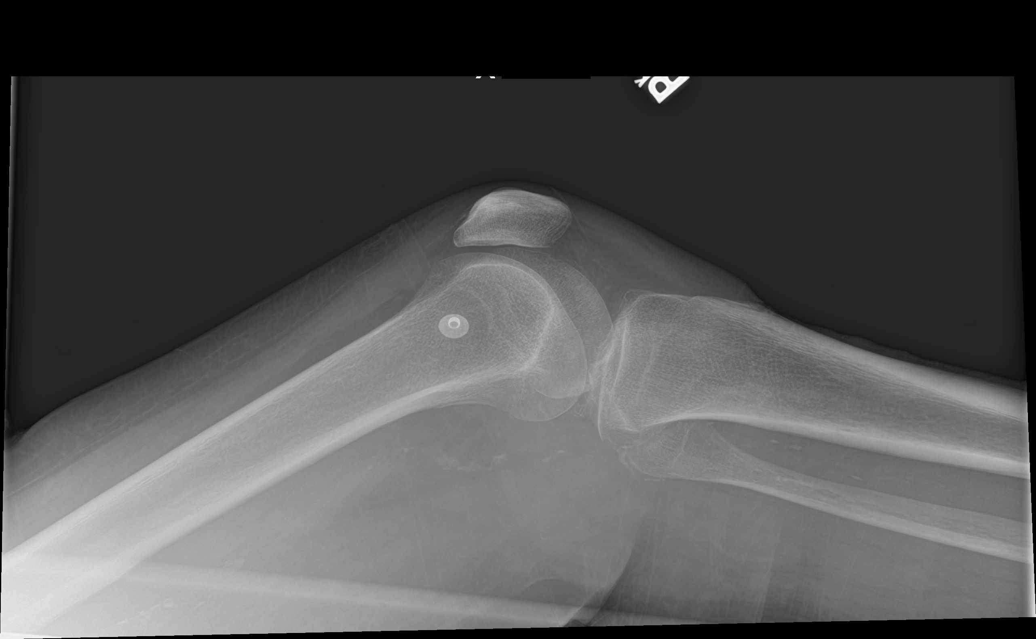

[5 of 5 positions shown; findings below may reference images not displayed]

FINDINGS: No fracture of the proximal tibia or distal femur. Patella is
normal. No joint effusion.

Linear calcifications in the medial and lateral compartment.
IMPRESSION: 1.  No fracture or dislocation.
2. Chondrocalcinosis of the mediolateral compartment.

## 2022-04-14 MED ORDER — AMLODIPINE BESYLATE 10 MG PO TABS
10.0000 mg | ORAL_TABLET | Freq: Every day | ORAL | Status: DC
  Administered 2022-04-15 – 2022-04-16 (×2): 10 mg via ORAL
  Filled 2022-04-14 (×2): qty 1

## 2022-04-14 MED ORDER — TRAZODONE HCL 50 MG PO TABS
25.0000 mg | ORAL_TABLET | Freq: Every evening | ORAL | Status: DC | PRN
  Administered 2022-04-15: 25 mg via ORAL
  Filled 2022-04-14: qty 1

## 2022-04-14 MED ORDER — ONDANSETRON HCL 4 MG PO TABS: 4.0000 mg | ORAL_TABLET | Freq: Four times a day (QID) | ORAL | Status: DC | PRN

## 2022-04-14 MED ORDER — POLYETHYLENE GLYCOL 3350 17 G PO PACK: 17.0000 g | PACK | Freq: Every day | ORAL | Status: DC | PRN

## 2022-04-14 MED ORDER — HEPARIN SODIUM (PORCINE) 5000 UNIT/ML IJ SOLN
5000.0000 [IU] | Freq: Three times a day (TID) | INTRAMUSCULAR | Status: DC
  Administered 2022-04-14 – 2022-04-16 (×6): 5000 [IU] via SUBCUTANEOUS
  Filled 2022-04-14 (×6): qty 1

## 2022-04-14 MED ORDER — ONDANSETRON HCL 4 MG/2ML IJ SOLN
4.0000 mg | Freq: Four times a day (QID) | INTRAMUSCULAR | Status: DC | PRN
Start: 2022-04-14 — End: 2022-04-16

## 2022-04-14 MED ORDER — RENA-VITE PO TABS
1.0000 | ORAL_TABLET | ORAL | Status: DC
  Administered 2022-04-16: 1 via ORAL
  Filled 2022-04-14: qty 1

## 2022-04-14 MED ORDER — MIRTAZAPINE 15 MG PO TABS
30.0000 mg | ORAL_TABLET | Freq: Every day | ORAL | Status: DC
  Administered 2022-04-14 – 2022-04-15 (×2): 30 mg via ORAL
  Filled 2022-04-14 (×2): qty 2

## 2022-04-14 MED ORDER — ACETAMINOPHEN 650 MG RE SUPP: 650.0000 mg | Freq: Four times a day (QID) | RECTAL | Status: DC | PRN

## 2022-04-14 MED ORDER — INSULIN ASPART PROT & ASPART (70-30 MIX) 100 UNIT/ML ~~LOC~~ SUSP
15.0000 [IU] | Freq: Three times a day (TID) | SUBCUTANEOUS | Status: DC
  Administered 2022-04-15 (×3): 15 [IU] via SUBCUTANEOUS
  Filled 2022-04-14: qty 10

## 2022-04-14 MED ORDER — ONDANSETRON HCL 4 MG/2ML IJ SOLN
4.0000 mg | Freq: Once | INTRAMUSCULAR | Status: DC
  Filled 2022-04-14: qty 2

## 2022-04-14 MED ORDER — SUCRALFATE 1 G PO TABS
1.0000 g | ORAL_TABLET | Freq: Two times a day (BID) | ORAL | Status: DC
  Filled 2022-04-14: qty 1

## 2022-04-14 MED ORDER — TOPIRAMATE 25 MG PO TABS
25.0000 mg | ORAL_TABLET | Freq: Every day | ORAL | Status: DC
Start: 2022-04-14 — End: 2022-04-16
  Administered 2022-04-15 – 2022-04-16 (×2): 25 mg via ORAL
  Filled 2022-04-14 (×2): qty 1

## 2022-04-14 MED ORDER — ONDANSETRON 4 MG PO TBDP
4.0000 mg | ORAL_TABLET | Freq: Once | ORAL | Status: AC | PRN
  Administered 2022-04-14: 4 mg via ORAL
  Filled 2022-04-14: qty 1

## 2022-04-14 MED ORDER — OXYCODONE-ACETAMINOPHEN 5-325 MG PO TABS
1.0000 | ORAL_TABLET | ORAL | Status: DC | PRN
Start: 2022-04-14 — End: 2022-04-16
  Administered 2022-04-14 – 2022-04-15 (×2): 1 via ORAL
  Filled 2022-04-14 (×2): qty 1

## 2022-04-14 MED ORDER — LIDOCAINE-PRILOCAINE 2.5-2.5 % EX CREA: 1.0000 "application " | TOPICAL_CREAM | CUTANEOUS | Status: DC | PRN

## 2022-04-14 MED ORDER — SODIUM CHLORIDE 0.9 % IV BOLUS
500.0000 mL | Freq: Once | INTRAVENOUS | Status: AC
  Administered 2022-04-15: 500 mL via INTRAVENOUS

## 2022-04-14 MED ORDER — ISOSORBIDE MONONITRATE ER 30 MG PO TB24
30.0000 mg | ORAL_TABLET | Freq: Every day | ORAL | Status: DC
Start: 2022-04-15 — End: 2022-04-16
  Administered 2022-04-15 – 2022-04-16 (×2): 30 mg via ORAL
  Filled 2022-04-14 (×2): qty 1

## 2022-04-14 MED ORDER — TRAZODONE HCL 50 MG PO TABS
50.0000 mg | ORAL_TABLET | Freq: Every day | ORAL | Status: DC
  Administered 2022-04-14 – 2022-04-15 (×2): 50 mg via ORAL
  Filled 2022-04-14 (×2): qty 1

## 2022-04-14 MED ORDER — ATORVASTATIN CALCIUM 20 MG PO TABS
80.0000 mg | ORAL_TABLET | Freq: Every day | ORAL | Status: DC
Start: 2022-04-14 — End: 2022-04-16
  Administered 2022-04-15: 80 mg via ORAL
  Filled 2022-04-14: qty 4

## 2022-04-14 MED ORDER — PANTOPRAZOLE SODIUM 40 MG PO TBEC: 40.0000 mg | DELAYED_RELEASE_TABLET | Freq: Every day | ORAL | Status: DC

## 2022-04-14 MED ORDER — MAGNESIUM HYDROXIDE 400 MG/5ML PO SUSP: 30.0000 mL | Freq: Every day | ORAL | Status: DC | PRN

## 2022-04-14 MED ORDER — ACETAMINOPHEN 325 MG PO TABS
650.0000 mg | ORAL_TABLET | Freq: Four times a day (QID) | ORAL | Status: DC | PRN
  Administered 2022-04-15: 650 mg via ORAL
  Filled 2022-04-14: qty 2

## 2022-04-14 MED ORDER — CARVEDILOL 25 MG PO TABS
25.0000 mg | ORAL_TABLET | Freq: Two times a day (BID) | ORAL | Status: DC
  Administered 2022-04-14 – 2022-04-16 (×4): 25 mg via ORAL
  Filled 2022-04-14 (×4): qty 1

## 2022-04-14 MED ORDER — METOCLOPRAMIDE HCL 5 MG/ML IJ SOLN
10.0000 mg | Freq: Four times a day (QID) | INTRAMUSCULAR | Status: DC
  Administered 2022-04-14 – 2022-04-15 (×4): 10 mg via INTRAVENOUS
  Filled 2022-04-14 (×6): qty 2

## 2022-04-14 MED ORDER — FAMOTIDINE 20 MG PO TABS
40.0000 mg | ORAL_TABLET | Freq: Every day | ORAL | Status: DC
  Administered 2022-04-15 – 2022-04-16 (×2): 40 mg via ORAL
  Filled 2022-04-14 (×2): qty 2

## 2022-04-14 MED ORDER — HYOSCYAMINE SULFATE 0.125 MG SL SUBL
0.1250 mg | SUBLINGUAL_TABLET | Freq: Four times a day (QID) | SUBLINGUAL | Status: DC | PRN
  Filled 2022-04-14 (×2): qty 1

## 2022-04-14 MED ORDER — GABAPENTIN 100 MG PO CAPS
100.0000 mg | ORAL_CAPSULE | Freq: Every day | ORAL | Status: DC | PRN
  Administered 2022-04-15: 100 mg via ORAL
  Filled 2022-04-14: qty 1

## 2022-04-14 MED ORDER — VITAMIN B-12 1000 MCG PO TABS
1000.0000 ug | ORAL_TABLET | Freq: Every day | ORAL | Status: DC
  Administered 2022-04-15 – 2022-04-16 (×2): 1000 ug via ORAL
  Filled 2022-04-14 (×2): qty 1

## 2022-04-14 MED ORDER — AMMONIUM LACTATE 12 % EX LOTN
1.0000 "application " | TOPICAL_LOTION | CUTANEOUS | Status: DC
  Filled 2022-04-14: qty 400

## 2022-04-14 NOTE — ED Notes (Signed)
Patient was able to pivot and from recliner to ED strecther without help ?

## 2022-04-14 NOTE — ED Notes (Signed)
Dialysis patient M,W,F. Reports missing treatment today due to knee pain. Had steroid shot for knee yesterday. Reports her right knee giving out due to pain and causing her to fall to carpet. Also c/o slightly of abdominal pain and nausea ?

## 2022-04-14 NOTE — Assessment & Plan Note (Deleted)
-   The patient will be placed on supplement coverage with NovoLog and will continue basal coverage. ?

## 2022-04-14 NOTE — Assessment & Plan Note (Signed)
-   She will be on IV PPI therapy. ?

## 2022-04-14 NOTE — Assessment & Plan Note (Signed)
-   We will continue her antihypertensives. 

## 2022-04-14 NOTE — Assessment & Plan Note (Signed)
-   Dr. Holley Raring was consulted and is aware about the patient. ?- He will plan for hemodialysis in a.m. as she missed her HD session today. ?

## 2022-04-14 NOTE — Assessment & Plan Note (Signed)
-   This is clearly secondary to #1 ?- Management as above. ?- We will obtain a PT consult. ?

## 2022-04-14 NOTE — ED Triage Notes (Signed)
Pt via EMS from home. Pt c/o R knee pain, pt got her first cortisone shot yesterday. Pt c/o R knee pain and swelling since then. Pt missed dialysis today due to the pain. Pt normally MWF. Pt also endorses abd pain, back pain and nausea. EMS did find pt on the floor. Pt did fall this AM due to this. Denies head injury. Denies LOC. Pt is A&OX4 and NAD  ?

## 2022-04-14 NOTE — Assessment & Plan Note (Addendum)
-   The patient be admitted to an observation medical telemetry bed. ?- She has associated diarrhea so likely acute viral gastroenteritis. ?- She will be placed on scheduled antiemetic therapy with IV Reglan and as needed IV Zofran. ?- As needed Imodium will be given. ?- She will be placed on clear liquids to be advanced as tolerated. ?- Given report of black stools will obtain stool Hemoccult. ?

## 2022-04-14 NOTE — Assessment & Plan Note (Signed)
-   We will place her on supplement coverage with NovoLog. ?- We will continue her basal coverage. ?

## 2022-04-14 NOTE — ED Provider Notes (Signed)
? ?[facility] ?Provider Note ? ? ? Event Date/Time  ? First MD Initiated Contact with Patient 04/14/22 1557   ?  (approximate) ? ?History  ? ?Chief Complaint: Fall and Knee Pain ? ?HPI ? ?Terry Arroyo is a 61 y.o. female with a past medical history of ESRD on HD Monday/Wednesday/Friday, missed dialysis today who presents to the emergency department for generalized weakness fall nausea vomiting.  According to the patient for the past 2 days or so she has been extremely nauseated with frequent episodes of vomiting.  Patient states she has not been able to eat anything or drink since yesterday due to nausea and vomiting.  Patient states she was feeling very weak today and missed her dialysis session due to weakness.  Patient states she called EMS when EMS got there they found the patient on the floor patient states she had fallen due to weakness complaining of right knee pain as her only pain.  Denies any headache denies hitting her head or LOC. ? ?Physical Exam  ? ?Triage Vital Signs: ?ED Triage Vitals  ?Enc Vitals Group  ?   BP 04/14/22 1416 (!) 157/83  ?   Pulse Rate 04/14/22 1416 63  ?   Resp 04/14/22 1416 16  ?   Temp 04/14/22 1416 98 ?F (36.7 ?C)  ?   Temp Source 04/14/22 1416 Oral  ?   SpO2 04/14/22 1416 97 %  ?   Weight 04/14/22 1413 168 lb (76.2 kg)  ?   Height 04/14/22 1413 '5\' 8"'$  (1.727 m)  ?   Head Circumference --   ?   Peak Flow --   ?   Pain Score 04/14/22 1413 8  ?   Pain Loc --   ?   Pain Edu? --   ?   Excl. in [place]? --   ? ? ?Most recent vital signs: ?Vitals:  ? 04/14/22 1416  ?BP: (!) 157/83  ?Pulse: 63  ?Resp: 16  ?Temp: 98 ?F (36.7 ?C)  ?SpO2: 97%  ? ? ?General: Awake, no distress.  ?CV:  Good peripheral perfusion.  Regular rate and rhythm  ?Resp:  Normal effort.  Equal breath sounds bilaterally.  ?Abd:  No distention.  Soft, nontender.  No rebound or guarding. ?Other:  Moderate tenderness palpation of the right knee but good range of motion.  Appears to be neurovascular  intact with warm distal extremity. ? ? ?ED Results / Procedures / Treatments  ? ?EKG ? ?EKG viewed and interpreted by myself shows normal sinus rhythm at 64 bpm with a narrow QRS, normal axis, normal intervals, no concerning ST changes. ? ?RADIOLOGY ? ?I personally evaluated the x-ray images, no concerning finding on my evaluation. ?Radiology is read the knee x-ray as negative for fracture. ? ? ?MEDICATIONS ORDERED IN ED: ?Medications  ?sodium chloride 0.9 % bolus 500 mL (has no administration in time range)  ?ondansetron (ZOFRAN) injection 4 mg (has no administration in time range)  ?ondansetron (ZOFRAN-ODT) disintegrating tablet 4 mg (4 mg Oral Given 04/14/22 1419)  ? ? ? ?IMPRESSION / MDM / ASSESSMENT AND PLAN / ED COURSE  ?I reviewed the triage vital signs and the nursing notes. ? ?Patient presents emergency department for generalized weakness nausea and vomiting since yesterday with a fall today due to weakness.  Patient missed her dialysis session today.  Patient's lab work shows a reassuring CBC largely at her baseline with a chemistry showing renal failure with a creatinine of 6.2 which is  large at baseline but BUN is elevated to 69 which is about double her baseline which could be accounting for the patient's nausea as well as weakness.  I spoke to Dr. Holley Raring of nephrology who recommends admission.  We will IV hydrate treat with Zofran.  Nephrology will arrange for dialysis first thing in the morning.  I will speak to the hospitalist regarding admission.  Patient agreeable to plan of care. ? ?FINAL CLINICAL IMPRESSION(S) / ED DIAGNOSES  ? ?Elevated BUN ?Nausea vomiting ?Weakness ? ? ?Note:  This document was prepared using Dragon voice recognition software and may include unintentional dictation errors. ?  ?Harvest Dark, MD ?04/14/22 1656 ? ?

## 2022-04-14 NOTE — H&P (Signed)
?  ?  ?[place] ? ? ?PATIENT NAME: Terry Arroyo   ? ?MR#:  [id number] ? ?DATE OF BIRTH:  24-Nov-1961 ? ?DATE OF ADMISSION:  04/14/2022 ? ?PRIMARY CARE PHYSICIAN: Kirk Ruths, MD  ? ?Patient is coming from: Home ? ?REQUESTING/REFERRING PHYSICIAN: Harvest Dark, MD ? ?CHIEF COMPLAINT:  ? ?Chief Complaint  ?Patient presents with  ? Fall  ? Knee Pain  ? ? ?HISTORY OF PRESENT ILLNESS:  ?Terry Arroyo is a 61 y.o. female with medical history significant for end-stage renal disease on hemodialysis on MWF, COPD, systolic CHF, CAD, depression, GERD, PVD and OSA as well as CVA, who presented to the ER with acute onset of generalized weakness with nausea and vomiting.  She had a fall with subsequent knee pain.  She therefore missed her hemodialysis today.  She stated she has not been able to eat or drink anything since yesterday due to her intractable nausea and vomiting.  When EMS arrived to replace they found the patient on the floor and she was not able to get up from weakness and right knee pain.  No head injury.  She denies any headache or dizziness or blurred vision.  No paresthesias or focal muscle weakness.  She denied any presyncope or syncope.  No chest pain or palpitations.  No wheezing or hemoptysis.  No bleeding diathesis. ? ?ED Course: Upon presentation to the ER, BP was 157/83 with otherwise normal vital signs.  Labs revealed mild hyponatremia 132 and hypochloremia of 92 with blood glucose of 320 and BUN of 69 up from 29 on 4/18 with creatinine of 6.22 calcium 8.7.  CBC showed mild anemia close to baseline. She had a right knee arthrocentesis yesterday that revealed 309 WBCs with 9 neutrophils with clear liquid. ?EKG as reviewed by me : EKG showed normal sinus rhythm with a rate of 64 with prolonged QT and overall with QTc of 470 MS.  It showed T wave inversion laterally. ?Imaging: Right knee x-ray showed no fracture or dislocation.  It showed chondrocalcinosis of the medio-lateral  compartment. ? ?The patient was given 500 mill IV normal saline bolus.  Dr. Zollie Scale was contacted and is aware about the patient.  He will plan hemodialysis in the morning.  The patient will be admitted to observation telemetry bed for further evaluation and management. ?PAST MEDICAL HISTORY:  ? ?Past Medical History:  ?Diagnosis Date  ? Anemia   ? Angina at rest [facility])   ? Angioedema 03/20/2021  ? Lisinopril  ? Anterior cerebral aneurysm   ? approximately 5 mm; left MCA   ? Anxiety   ? Aortic atherosclerosis ([place])   ? Asthma   ? Blind left eye   ? CHF (congestive heart failure) ([place])   ? COPD (chronic obstructive pulmonary disease) ([place])   ? Coronary artery disease   ? Depression   ? DOE (dyspnea on exertion)   ? ESRD (end stage renal disease) ([place])   ? Gait instability   ? Genital herpes   ? GERD (gastroesophageal reflux disease)   ? HFrEF (heart failure with reduced ejection fraction) ([place])   ? History of 2019 novel coronavirus disease (COVID-19) 03/2019  ? [place]  ? History of DVT (deep vein thrombosis)   ? History of DVT (deep vein thrombosis)   ? Hx of CABG 2018  ? x 4; performed in [place]  ? Hyperlipidemia   ? Hypertension   ? MI (myocardial infarction) ([place]) 2018  ?  no stents  ? Pituitary adenoma ([place])   ? PVD (posterior vitreous detachment), right eye   ? Secondary hyperparathyroidism ([place])   ? Sleep apnea   ? non-compliant with nocturnal PAP therapy  ? Stroke [facility]) 2014  ? stated affected left side.  ? T2DM (type 2 diabetes mellitus) ([place])   ? ? ?PAST SURGICAL HISTORY:  ? ?Past Surgical History:  ?Procedure Laterality Date  ? A/V FISTULAGRAM Right 12/29/2021  ? Procedure: A/V FISTULAGRAM;  Surgeon: Katha Cabal, MD;  Location: [place] CV LAB;  Service: Cardiovascular;  Laterality: Right;  ? AV FISTULA PLACEMENT Right 01/28/2021  ? Procedure: ARTERIOVENOUS (AV) FISTULA CREATION;  Surgeon: Katha Cabal, MD;  Location: [facility] ORS;  Service: Vascular;  Laterality:  Right;  ? AV FISTULA PLACEMENT Right 05/13/2021  ? Procedure: INSERTION OF ARTERIOVENOUS (AV) GORE-TEX GRAFT ARM ( BRACHIAL AXILLARY );  Surgeon: Katha Cabal, MD;  Location: [facility] ORS;  Service: Vascular;  Laterality: Right;  ? [place]  ? pituitary tumor  ? BREAST EXCISIONAL BIOPSY Left yrs ago  ? benign  ? CESAREAN SECTION    ? CORONARY ARTERY BYPASS GRAFT  2018  ? x 4 vessels; performed in [place]  ? DIALYSIS/PERMA CATHETER INSERTION N/A 11/07/2020  ? Procedure: DIALYSIS/PERMA CATHETER INSERTION;  Surgeon: Algernon Huxley, MD;  Location: [place] CV [facility];  Service: Cardiovascular;  Laterality: N/A;  ? DIALYSIS/PERMA CATHETER REMOVAL N/A 06/12/2021  ? Procedure: DIALYSIS/PERMA CATHETER REMOVAL;  Surgeon: Algernon Huxley, MD;  Location: [place] [facility];  Service: Cardiovascular;  Laterality: N/A;  ? ESOPHAGOGASTRODUODENOSCOPY (EGD) WITH PROPOFOL N/A 12/22/2021  ? Procedure: ESOPHAGOGASTRODUODENOSCOPY (EGD) WITH PROPOFOL;  Surgeon: Lesly Rubenstein, MD;  Location: [facility] ENDOSCOPY;  Service: Gastroenterology;  Laterality: N/A;  IDDM  ? EYE SURGERY Right 2021  ? cataracts   ? PERIPHERAL VASCULAR THROMBECTOMY Right 04/03/2021  ? Procedure: A/V Fistulagram ;  Surgeon: Katha Cabal, MD;  Location: [place] [facility];  Service: Cardiovascular;  Laterality: Right;  ? ? ?SOCIAL HISTORY:  ? ?Social History  ? ?Tobacco Use  ? Smoking status: Former  ?  Types: Cigarettes  ?  Quit date: 2018  ?  Years since quitting: 5.3  ? Smokeless tobacco: Never  ?Substance Use Topics  ? Alcohol use: Not Currently  ? ? ?FAMILY HISTORY:  ? ?Family History  ?Problem Relation Age of Onset  ? CAD Mother   ? CAD Brother   ? Breast cancer Sister 52  ? ? ?DRUG ALLERGIES:  ? ?Allergies  ?Allergen Reactions  ? Hydralazine Hives, Shortness Of Breath, Swelling and Rash  ?  Body aches ?  ? Kiwi Extract Hives, Swelling and Rash  ?  Mouth swells   ? Lisinopril Swelling  ?  Angioedema  ? Shellfish Allergy Anaphylaxis  ?   Shrimp/lobster  ?Betadine leaves welts on skin  ?  ? Betadine [Povidone Iodine] Hives and Rash  ?  LEAVES WELTS ON SKIN  ? Metformin Diarrhea and Other (See Comments)  ?  GI Intolerance  ? Tape Itching  ?  Use paper tape whenever possible  ? ? ?REVIEW OF SYSTEMS:  ? ?ROS ?As per history of present illness. All pertinent systems were reviewed above. Constitutional, HEENT, cardiovascular, respiratory, GI, GU, musculoskeletal, neuro, psychiatric, endocrine, integumentary and hematologic systems were reviewed and are otherwise negative/unremarkable except for positive findings mentioned above in the HPI. ? ? ?MEDICATIONS AT HOME:  ? ?Prior to Admission medications   ?Medication  Sig Start Date End Date Taking? Authorizing Provider  ?acetaminophen (TYLENOL) 500 MG tablet Take 1,000 mg by mouth every 6 (six) hours as needed for mild pain or moderate pain.    [provider]  ?albuterol (VENTOLIN HFA) 108 (90 Base) MCG/ACT inhaler Inhale 2 puffs into the lungs every 6 (six) hours as needed for wheezing or shortness of breath.    [provider]  ?amLODipine (NORVASC) 10 MG tablet Take 1 tablet (10 mg total) by mouth daily. 03/03/20   Lorella Nimrod, MD  ?ammonium lactate (LAC-HYDRIN) 12 % lotion Apply 1 application topically every Monday, Wednesday, and Friday.    [provider]  ?amoxicillin (AMOXIL) 500 MG capsule Take 500 mg by mouth 2 (two) times daily. ?Patient not taking: Reported on 03/29/2022 03/11/22   [provider]  ?aspirin 81 MG EC tablet Take 1 tablet (81 mg total) by mouth daily. 02/10/21   Max Sane, MD  ?atorvastatin (LIPITOR) 80 MG tablet Take 1 tablet (80 mg total) by mouth daily at 6 PM. 11/26/19   Lavina Hamman, MD  ?Blood Glucose Monitoring Suppl (TRUE METRIX METER) w/Device KIT  09/02/21   [provider]  ?carvedilol (COREG) 25 MG tablet Take 25 mg by mouth 2 (two) times daily.    [provider]  ?doxycycline (VIBRA-TABS) 100 MG tablet Take 100 mg  by mouth 2 (two) times daily. ?Patient not taking: Reported on 03/29/2022 03/11/22   [provider]  ?famotidine (PEPCID) 40 MG tablet Take 1 tablet by mouth daily. 01/28/22   [provider]  ?furosemide (LASIX)

## 2022-04-14 NOTE — Assessment & Plan Note (Signed)
-   We will continue statin therapy. 

## 2022-04-15 DIAGNOSIS — E1165 Type 2 diabetes mellitus with hyperglycemia: Secondary | ICD-10-CM

## 2022-04-15 DIAGNOSIS — E871 Hypo-osmolality and hyponatremia: Secondary | ICD-10-CM | POA: Diagnosis not present

## 2022-04-15 DIAGNOSIS — R531 Weakness: Secondary | ICD-10-CM | POA: Diagnosis not present

## 2022-04-15 DIAGNOSIS — Z794 Long term (current) use of insulin: Secondary | ICD-10-CM

## 2022-04-15 DIAGNOSIS — Z992 Dependence on renal dialysis: Secondary | ICD-10-CM

## 2022-04-15 DIAGNOSIS — R799 Abnormal finding of blood chemistry, unspecified: Secondary | ICD-10-CM

## 2022-04-15 DIAGNOSIS — N186 End stage renal disease: Secondary | ICD-10-CM | POA: Diagnosis not present

## 2022-04-15 DIAGNOSIS — K219 Gastro-esophageal reflux disease without esophagitis: Secondary | ICD-10-CM

## 2022-04-15 DIAGNOSIS — R112 Nausea with vomiting, unspecified: Secondary | ICD-10-CM | POA: Diagnosis not present

## 2022-04-15 DIAGNOSIS — I10 Essential (primary) hypertension: Secondary | ICD-10-CM

## 2022-04-15 DIAGNOSIS — D631 Anemia in chronic kidney disease: Secondary | ICD-10-CM | POA: Diagnosis not present

## 2022-04-15 DIAGNOSIS — N2581 Secondary hyperparathyroidism of renal origin: Secondary | ICD-10-CM | POA: Diagnosis not present

## 2022-04-15 DIAGNOSIS — E785 Hyperlipidemia, unspecified: Secondary | ICD-10-CM | POA: Diagnosis not present

## 2022-04-15 LAB — CBC
HCT: 36.2 % (ref 36.0–46.0)
Hemoglobin: 11.3 g/dL — ABNORMAL LOW (ref 12.0–15.0)
MCH: 27 pg (ref 26.0–34.0)
MCHC: 31.2 g/dL (ref 30.0–36.0)
MCV: 86.4 fL (ref 80.0–100.0)
Platelets: 195 10*3/uL (ref 150–400)
RBC: 4.19 MIL/uL (ref 3.87–5.11)
RDW: 17.2 % — ABNORMAL HIGH (ref 11.5–15.5)
WBC: 7.7 10*3/uL (ref 4.0–10.5)
nRBC: 0 % (ref 0.0–0.2)

## 2022-04-15 LAB — BASIC METABOLIC PANEL
Anion gap: 13 (ref 5–15)
BUN: 77 mg/dL — ABNORMAL HIGH (ref 6–20)
CO2: 23 mmol/L (ref 22–32)
Calcium: 8.5 mg/dL — ABNORMAL LOW (ref 8.9–10.3)
Chloride: 94 mmol/L — ABNORMAL LOW (ref 98–111)
Creatinine, Ser: 6.79 mg/dL — ABNORMAL HIGH (ref 0.44–1.00)
GFR, Estimated: 7 mL/min — ABNORMAL LOW (ref >60)
Glucose, Bld: 328 mg/dL — ABNORMAL HIGH (ref 70–99)
Potassium: 5 mmol/L (ref 3.5–5.1)
Sodium: 130 mmol/L — ABNORMAL LOW (ref 135–145)

## 2022-04-15 LAB — HEMOGLOBIN AND HEMATOCRIT, BLOOD
HCT: 35 % — ABNORMAL LOW (ref 36.0–46.0)
Hemoglobin: 11.1 g/dL — ABNORMAL LOW (ref 12.0–15.0)

## 2022-04-15 LAB — LACTIC ACID, PLASMA: Lactic Acid, Venous: 2.2 mmol/L (ref 0.5–1.9)

## 2022-04-15 LAB — GLUCOSE, CAPILLARY
Glucose-Capillary: 305 mg/dL — ABNORMAL HIGH (ref 70–99)
Glucose-Capillary: 142 mg/dL — ABNORMAL HIGH (ref 70–99)
Glucose-Capillary: 130 mg/dL — ABNORMAL HIGH (ref 70–99)

## 2022-04-15 LAB — TROPONIN I (HIGH SENSITIVITY): Troponin I (High Sensitivity): 57 ng/L — ABNORMAL HIGH (ref <18)

## 2022-04-15 LAB — HEPATITIS B SURFACE ANTIGEN: Hepatitis B Surface Ag: NONREACTIVE

## 2022-04-15 MED ORDER — HEPARIN SODIUM (PORCINE) 1000 UNIT/ML DIALYSIS: 1000.0000 [IU] | INTRAMUSCULAR | Status: DC | PRN

## 2022-04-15 MED ORDER — SODIUM CHLORIDE 0.9 % IV SOLN: 100.0000 mL | INTRAVENOUS | Status: DC | PRN

## 2022-04-15 MED ORDER — ALTEPLASE 2 MG IJ SOLR: 2.0000 mg | Freq: Once | INTRAMUSCULAR | Status: DC | PRN

## 2022-04-15 MED ORDER — LIDOCAINE-PRILOCAINE 2.5-2.5 % EX CREA: 1.0000 "application " | TOPICAL_CREAM | CUTANEOUS | Status: DC | PRN

## 2022-04-15 MED ORDER — CHLORHEXIDINE GLUCONATE CLOTH 2 % EX PADS
6.0000 | MEDICATED_PAD | Freq: Every day | CUTANEOUS | Status: DC
Start: 2022-04-15 — End: 2022-04-16
  Administered 2022-04-15 – 2022-04-16 (×2): 6 via TOPICAL

## 2022-04-15 MED ORDER — LIDOCAINE HCL (PF) 1 % IJ SOLN
5.0000 mL | INTRAMUSCULAR | Status: DC | PRN
  Filled 2022-04-15: qty 5

## 2022-04-15 MED ORDER — PENTAFLUOROPROP-TETRAFLUOROETH EX AERO: 1.0000 "application " | INHALATION_SPRAY | CUTANEOUS | Status: DC | PRN

## 2022-04-15 NOTE — Progress Notes (Signed)
?PROGRESS NOTE ? ?Terry Arroyo    DOB: [birth year], 61 y.o.  ?MVH:[id number]  ?  Code Status: Full Code   ?DOA: 04/14/2022   LOS: 0  ? ?Brief hospital course  ?Terry Arroyo is a 61 y.o. female with a PMH significant for ESRD (MWF), COPD, systolic CHF, CAD, depression, GERD, PVD, OSA, CVA. ? ?They presented from home to the ED on 04/14/2022 with weakness, N/V x a couple days. She was not able to attend her regularly scheduled HD due to illness and had knee pain after a fall. ? ?In the ED, it was found that they had normal vital signs with hypertension, mild hyponatremia 132 and hypochloremia of 92 with blood glucose of 320 and BUN of 69 up from 29 on 4/18 with creatinine of 6.22 calcium 8.7.  CBC showed mild anemia close to baseline. R knee xray without acute change.  ? ?They were treated with 500cc NS bolus and nephrology was consulted.  ?Patient was admitted to medicine service for further workup and management of dehydration as outlined in detail below. ? ?04/15/22 -stable, improved ? ?Assessment & Plan  ?Principal Problem: ?  Nausea and vomiting ?Active Problems: ?  Generalized weakness ?  Essential hypertension ?  End-stage renal disease on hemodialysis ([place]) ?  Uncontrolled type 2 diabetes mellitus with hyperglycemia, with long-term current use of insulin ([place]) ?  Dyslipidemia ?  GERD without esophagitis ? ?Nausea and vomiting  dehydration  acute physical deconditioning- resolved N/V. Able to tolerate a normal diet today. Continues to be weaker than baseline. Likely passed viral illness spontaneously. ?- antiemetics PRN ?- regular diet ?- PT ? ?Uncontrolled type 2 diabetes mellitus with hyperglycemia, with long-term current use of insulin ([facility]) ?- sSSI + basal coverage since tolerating diet ?  ?ESRD on HD MWF- mild electrolyte abnormalities remain after HD.  ?- nephrology following, appreciate your care ?- HD again tomorrow ?- RFP am ?  ?Essential hypertension ?- continue home antihypertensives ?  ?HLD ?-  continue statin therapy. ?  ?GERD without esophagitis ?- pepcid ?- sucralfate ? ?Body mass index is 25.54 kg/m?. ? ?VTE ppx: heparin injection 5,000 Units Start: 04/14/22 2200 ? ?Diet:  ?   ?Diet  ? Diet heart healthy/carb modified Room service appropriate? Yes; Fluid consistency: Thin  ? ?Consultants: ?nephrology ?Subjective 04/15/22   ? ?Pt reports feeling much better. Able to tolerate diet today ?  ?Objective  ? ?Vitals:  ? 04/14/22 1416 04/14/22 2027 04/14/22 2153 04/15/22 0421  ?BP: (!) 157/83 (!) 163/83 (!) 162/88 (!) 149/66  ?Pulse: 63 67 64 (!) 59  ?Resp: '16 16 20 16  '$ ?Temp: 98 ?F (36.7 ?C) 98.2 ?F (36.8 ?C) 98.1 ?F (36.7 ?C) 97.8 ?F (36.6 ?C)  ?TempSrc: Oral Oral    ?SpO2: 97% 95% 100% 97%  ?Weight:      ?Height:      ? ? ?Intake/Output Summary (Last 24 hours) at 04/15/2022 0759 ?Last data filed at 04/15/2022 0301 ?Gross per 24 hour  ?Intake 500.5 ml  ?Output --  ?Net 500.5 ml  ? ?Filed Weights  ? 04/14/22 1413  ?Weight: 76.2 kg  ?  ? ?Physical Exam:  ?General: awake, alert, NAD ?HEENT: atraumatic, clear conjunctiva, anicteric sclera, MMM, hearing grossly normal ?Respiratory: normal respiratory effort. ?Cardiovascular: quick capillary refill, normal S1/S2, RRR, no JVD, murmurs ?Gastrointestinal: soft, NT, ND ?Nervous: A&O x3. no gross focal neurologic deficits, normal speech ?Extremities: moves all equally, no edema, normal tone ?Skin: dry, intact, normal temperature, normal color.  No rashes, lesions or ulcers on exposed skin ?Psychiatry: normal mood, congruent affect ? ?Labs   ?I have personally reviewed the following labs and imaging studies ?CBC ?   ?Component Value Date/Time  ? WBC 7.7 04/15/2022 0548  ? RBC 4.19 04/15/2022 0548  ? HGB 11.3 (L) 04/15/2022 0548  ? HGB 12.6 11/18/2013 0138  ? HCT 36.2 04/15/2022 0548  ? HCT 37.9 11/18/2013 0138  ? PLT 195 04/15/2022 0548  ? PLT 444 (H) 11/18/2013 0138  ? MCV 86.4 04/15/2022 0548  ? MCV 83 11/18/2013 0138  ? MCH 27.0 04/15/2022 0548  ? MCHC 31.2 04/15/2022  0548  ? RDW 17.2 (H) 04/15/2022 0548  ? RDW 14.1 11/18/2013 0138  ? LYMPHSABS 1.1 04/01/2022 1208  ? MONOABS 0.8 04/01/2022 1208  ? EOSABS 0.1 04/01/2022 1208  ? BASOSABS 0.0 04/01/2022 1208  ? ? ?  Latest Ref Rng & Units 04/15/2022  ?  5:48 AM 04/14/2022  ?  2:18 PM 04/06/2022  ?  7:24 PM  ?BMP  ?Glucose 70 - 99 mg/dL 328   320   129    ?BUN 6 - 20 mg/dL 77   69   29    ?Creatinine 0.44 - 1.00 mg/dL 6.79   6.22   6.48    ?Sodium 135 - 145 mmol/L 130   132   136    ?Potassium 3.5 - 5.1 mmol/L 5.0   4.6   4.0    ?Chloride 98 - 111 mmol/L 94   92   94    ?CO2 22 - 32 mmol/L '23   25   29    '$ ?Calcium 8.9 - 10.3 mg/dL 8.5   8.7   8.9    ? ? ?DG Knee Complete 4 Views Right ? ?Result Date: 04/14/2022 ?CLINICAL DATA:  RIGHT knee pain. EXAM: RIGHT KNEE - COMPLETE 4+ VIEW COMPARISON:  None. FINDINGS: No fracture of the proximal tibia or distal femur. Patella is normal. No joint effusion. Linear calcifications in the medial and lateral compartment. IMPRESSION: 1.  No fracture or dislocation. 2. Chondrocalcinosis of the mediolateral compartment. Electronically Signed   By: Suzy Bouchard M.D.   On: 04/14/2022 14:45   ? ?Disposition Plan & Communication  ?Patient status: Observation  ?Admitted From: Home ?Planned disposition location: Home ?Anticipated discharge date: 4/28 pending nephrology ? ?Family Communication: none  ?  ?Author: ?Richarda Osmond, DO ?[facility] ?04/15/2022, 7:59 AM  ? ?Available by Epic secure chat 7AM-7PM. ?If 7PM-7AM, please contact night-coverage.  ?[facility] contact information found on [email or web address]. ? ?

## 2022-04-15 NOTE — Progress Notes (Signed)
?[facility]  ?ROUNDING NOTE  ? ?Subjective:  ? ?Terry Arroyo is a 61 year old African-American female with past medical history concerning GERD, CAD, systolic CHF, COPD, CVA, and end-stage renal disease on hemodialysis.  Patient presents to the emergency department with complaints of right knee pain after fall along with nausea and vomiting.  Patient has been admitted under observation for Weakness [R53.1] ?Elevated BUN [R79.9] ?Nausea and vomiting [R11.2] ?Nausea and vomiting, unspecified vomiting type [R11.2] ? ?Patient is known to our clinic and [facility], supervised by Dr. Juleen China.  Due to admission, patient missed dialysis treatment scheduled yesterday.  Patient denies missing any recent treatments outpatient.  Patient states she has not been able to eat or drink for 1 to 2 days due to nausea and vomiting.  Patient denies any sick contacts.  EMS report states patient was found on the floor after suffering a fall and unable to get up due to right knee pain and weakness.  Patient denies hitting her head or headache.  Denies shortness of breath and cough. ? ?Labs on arrival include sodium 132, glucose 320, BUN 69, and creatinine 6.22 with GFR 7.  Troponin 46.  Knee x-ray shows no fracture or dislocation. ? ?We have been consulted to manage dialysis needs ? ? ?Objective:  ?Vital signs in last 24 hours:  ?Temp:  [97.7 ?F (36.5 ?C)-98.5 ?F (36.9 ?C)] 97.7 ?F (36.5 ?C) (04/27 1325) ?Pulse Rate:  [58-67] 65 (04/27 1325) ?Resp:  [13-21] 15 (04/27 1325) ?BP: (139-173)/(65-106) 165/79 (04/27 1325) ?SpO2:  [95 %-100 %] 98 % (04/27 1325) ?Weight:  [76.2 kg] 76.2 kg (04/26 1413) ? ?Weight change:  ?Filed Weights  ? 04/14/22 1413  ?Weight: 76.2 kg  ? ? ?Intake/Output: ?I/O last 3 completed shifts: ?In: 500.5 [IV JOACZYSAY:301.6] ?Out: -  ?  ?Intake/Output this shift: ? Total I/O ?In: -  ?Out: 1000 [Other:1000] ? ?Physical Exam: ?General: NAD, resting  comfortably  ?Head: Normocephalic, atraumatic. Moist oral mucosal membranes  ?Eyes: Anicteric  ?Lungs:  Clear to auscultation, normal effort, room air  ?Heart: Regular rate and rhythm  ?Abdomen:  Soft, nontender, nondistended  ?Extremities: Right knee mild edema.  ?Neurologic: Nonfocal, moving all four extremities  ?Skin: No lesions  ?Access: Right AVF  ? ? ?Basic Metabolic Panel: ?Recent Labs  ?Lab 04/14/22 ?1418 04/15/22 ?0109  ?NA 132* 130*  ?K 4.6 5.0  ?CL 92* 94*  ?CO2 25 23  ?GLUCOSE 320* 328*  ?BUN 69* 77*  ?CREATININE 6.22* 6.79*  ?CALCIUM 8.7* 8.5*  ? ? ?Liver Function Tests: ?Recent Labs  ?Lab 04/14/22 ?1418  ?AST 17  ?ALT 20  ?ALKPHOS 86  ?BILITOT 0.9  ?PROT 7.5  ?ALBUMIN 3.5  ? ?No results for input(s): LIPASE, AMYLASE in the last 168 hours. ?No results for input(s): AMMONIA in the last 168 hours. ? ?CBC: ?Recent Labs  ?Lab 04/14/22 ?1418 04/14/22 ?2108 04/15/22 ?3235  ?WBC 9.5  --  7.7  ?HGB 11.0* 11.7* 11.3*  ?HCT 35.5* 36.5 36.2  ?MCV 86.8  --  86.4  ?PLT 197  --  195  ? ? ?Cardiac Enzymes: ?No results for input(s): CKTOTAL, CKMB, CKMBINDEX, TROPONINI in the last 168 hours. ? ?BNP: ?Invalid input(s): POCBNP ? ?CBG: ?Recent Labs  ?Lab 04/15/22 ?0928  ?GLUCAP 305*  ? ? ?Microbiology: ?Results for orders placed or performed during the hospital encounter of 03/29/22  ?Resp Panel by RT-PCR (Flu A&B, Covid) Nasopharyngeal Swab     Status:  None  ? Collection Time: 03/29/22  8:55 AM  ? Specimen: Nasopharyngeal Swab; Nasopharyngeal(NP) swabs in vial transport medium  ?Result Value Ref Range Status  ? SARS Coronavirus 2 by RT PCR NEGATIVE NEGATIVE Final  ?  Comment: (NOTE) ?SARS-CoV-2 target nucleic acids are NOT DETECTED. ? ?The SARS-CoV-2 RNA is generally detectable in upper respiratory ?specimens during the acute phase of infection. The lowest ?concentration of SARS-CoV-2 viral copies this assay can detect is ?138 copies/mL. A negative result does not preclude SARS-Cov-2 ?infection and should not be used as  the sole basis for treatment or ?other patient management decisions. A negative result may occur with  ?improper specimen collection/handling, submission of specimen other ?than nasopharyngeal swab, presence of viral mutation(s) within the ?areas targeted by this assay, and inadequate number of viral ?copies(<138 copies/mL). A negative result must be combined with ?clinical observations, patient history, and epidemiological ?information. The expected result is Negative. ? ?Fact Sheet for Patients:  ?EntrepreneurPulse.com.au ? ?Fact Sheet for Healthcare Providers:  ?IncredibleEmployment.be ? ?This test is no t yet approved or cleared by the [place] FDA and  ?has been authorized for detection and/or diagnosis of SARS-CoV-2 by ?FDA under an Emergency Use Authorization (EUA). This EUA will remain  ?in effect (meaning this test can be used) for the duration of the ?COVID-19 declaration under Section 564(b)(1) of the Act, 21 ?U.S.C.section 360bbb-3(b)(1), unless the authorization is terminated  ?or revoked sooner.  ? ? ?  ? Influenza A by PCR NEGATIVE NEGATIVE Final  ? Influenza B by PCR NEGATIVE NEGATIVE Final  ?  Comment: (NOTE) ?The Xpert Xpress SARS-CoV-2/FLU/RSV plus assay is intended as an aid ?in the diagnosis of influenza from Nasopharyngeal swab specimens and ?should not be used as a sole basis for treatment. Nasal washings and ?aspirates are unacceptable for Xpert Xpress SARS-CoV-2/FLU/RSV ?testing. ? ?Fact Sheet for Patients: ?EntrepreneurPulse.com.au ? ?Fact Sheet for Healthcare Providers: ?IncredibleEmployment.be ? ?This test is not yet approved or cleared by the [place] FDA and ?has been authorized for detection and/or diagnosis of SARS-CoV-2 by ?FDA under an Emergency Use Authorization (EUA). This EUA will remain ?in effect (meaning this test can be used) for the duration of the ?COVID-19 declaration under Section 564(b)(1) of  the Act, 21 U.S.C. ?section 360bbb-3(b)(1), unless the authorization is terminated or ?revoked. ? ?Performed at [facility], [place], ?[place] [address] ?  ? ? ?Coagulation Studies: ?No results for input(s): LABPROT, INR in the last 72 hours. ? ?Urinalysis: ?No results for input(s): COLORURINE, LABSPEC, [place], GLUCOSEU, HGBUR, BILIRUBINUR, KETONESUR, PROTEINUR, UROBILINOGEN, NITRITE, LEUKOCYTESUR in the last 72 hours. ? ?Invalid input(s): APPERANCEUR  ? ? ?Imaging: ?DG Knee Complete 4 Views Right ? ?Result Date: 04/14/2022 ?CLINICAL DATA:  RIGHT knee pain. EXAM: RIGHT KNEE - COMPLETE 4+ VIEW COMPARISON:  None. FINDINGS: No fracture of the proximal tibia or distal femur. Patella is normal. No joint effusion. Linear calcifications in the medial and lateral compartment. IMPRESSION: 1.  No fracture or dislocation. 2. Chondrocalcinosis of the mediolateral compartment. Electronically Signed   By: Suzy Bouchard M.D.   On: 04/14/2022 14:45   ? ? ?Medications:  ? ? sodium chloride    ? sodium chloride    ? ? amLODipine  10 mg Oral Daily  ? [START ON 04/16/2022] ammonium lactate  1 application. Topical Q M,W,F  ? atorvastatin  80 mg Oral q1800  ? carvedilol  25 mg Oral BID  ? Chlorhexidine Gluconate Cloth  6 each Topical Q0600  ?  famotidine  40 mg Oral Daily  ? heparin  5,000 Units Subcutaneous Q8H  ? insulin aspart protamine- aspart  15 Units Subcutaneous TID WC  ? isosorbide mononitrate  30 mg Oral Daily  ? metoCLOPramide (REGLAN) injection  10 mg Intravenous Q6H  ? mirtazapine  30 mg Oral QHS  ? [START ON 04/16/2022] multivitamin  1 tablet Oral Q M,W,F  ? ondansetron (ZOFRAN) IV  4 mg Intravenous Once  ? sucralfate  1 g Oral BID AC  ? topiramate  25 mg Oral Daily  ? traZODone  50 mg Oral QHS  ? vitamin B-12  1,000 mcg Oral Daily  ? ?sodium chloride, sodium chloride, acetaminophen **OR** acetaminophen, alteplase, gabapentin, heparin, hyoscyamine, lidocaine (PF), lidocaine-prilocaine,  lidocaine-prilocaine, magnesium hydroxide, ondansetron **OR** ondansetron (ZOFRAN) IV, oxyCODONE-acetaminophen, pentafluoroprop-tetrafluoroeth, polyethylene glycol, traZODone ? ?Assessment/ Plan:  ?Ms. Enid Derry

## 2022-04-15 NOTE — TOC Initial Note (Addendum)
Transition of Care (TOC) - Initial/Assessment Note  ? ? ?Patient Details  ?Name: Terry Arroyo ?MRN: [id number] ?Date of Birth: Aug 30, 1961 ? ?Transition of Care (TOC) CM/SW Contact:    ?Terry Oms, RN ?Phone Number: ?04/15/2022, 9:21 AM ? ?Clinical Narrative:                 ? ?The patient is from home alone, ? She goes to Dialysis MWF, [facility] [place] MWF 11:15am, she missed her appt yesterday due to weakness, She will have dialysis here today,  ? PT to eval, TOC to monitor for needs, Admitted from: home alone ?PCP: Terry Arroyo ?Pharmacy: [facility] mail ?Patient states that she uses [facility] to get to an from HD and to appointments. ?She was provided a RW for Home last admission in 02/23 ?She uses cab to get home from hospital ?She will need a voucher ? ?  ?  ? ? ?Patient Goals and CMS Choice ?  ?  ?  ? ?Expected Discharge Plan and Services ?  ?  ?  ?  ?  ?                ?  ?  ?  ?  ?  ?  ?  ?  ?  ?  ? ?Prior Living Arrangements/Services ?  ?  ?  ?       ?  ?  ?  ?  ? ?Activities of Daily Living ?  ?  ? ?Permission Sought/Granted ?  ?  ?   ?   ?   ?   ? ?Emotional Assessment ?  ?  ?  ?  ?  ?  ? ?Admission diagnosis:  Weakness [R53.1] ?Elevated BUN [R79.9] ?Nausea and vomiting [R11.2] ?Nausea and vomiting, unspecified vomiting type [R11.2] ?Patient Active Problem List  ? Diagnosis Date Noted  ? Nausea and vomiting 04/14/2022  ? Generalized weakness 04/14/2022  ? Dyslipidemia 04/14/2022  ? GERD without esophagitis 04/14/2022  ? Type 2 diabetes mellitus without complications ([place]) 76/19/5093  ? Uncontrolled type 2 diabetes mellitus with hyperglycemia, with long-term current use of insulin ([place]) 04/14/2022  ? Physical debility 02/13/2022  ? Acute pain of left thigh 02/09/2022  ? Cellulitis of left thigh 02/09/2022  ? Atherosclerosis of native arteries of extremity with intermittent claudication ([place]) 02/04/2022  ? Leg pain, anterior, left 02/04/2022  ? Angioedema 10/28/2021  ? Lactic acidosis   ? Diabetic  polyneuropathy associated with type 2 diabetes mellitus ([place])   ? Sepsis ([place]) 09/05/2021  ? Acute CHF (congestive heart failure) ([place]) 09/05/2021  ? Fluid overload 06/11/2021  ? Hypokalemia 06/11/2021  ? Acute pulmonary edema (HCC)   ? COPD (chronic obstructive pulmonary disease) with emphysema ([place]) 02/09/2021  ? Chronic sphenoidal sinusitis 01/01/2021  ? Contact dermatitis and other eczema due to other specified agent 01/01/2021  ? Hydronephrosis, left 01/01/2021  ? Other diseases of nasal cavity and sinuses(478.19) 01/01/2021  ? End-stage renal disease on hemodialysis ([place])   ? Reactive thrombocytosis 11/08/2020  ? Nephrotic syndrome 11/04/2020  ? SOB (shortness of breath) 11/01/2020  ? Acute on chronic combined systolic and diastolic CHF (congestive heart failure) ([place]) 10/21/2020  ? CAD (coronary artery disease) 10/21/2020  ? Hyperlipidemia associated with type 2 diabetes mellitus ([place]) 10/21/2020  ? HFrEF (heart failure with reduced ejection fraction) ([place])   ? Acute respiratory distress   ? COPD exacerbation ([place]) 10/10/2020  ? Kidney hematoma 09/13/2020  ? Essential hypertension   ? Acute renal  failure superimposed on stage 4 chronic kidney disease ([place])   ? Hematuria 09/12/2020  ? 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 Blind left eye 09/03/2020  ? Brain aneurysm 09/03/2020  ? Esotropia 09/03/2020  ? Lesion of ulnar nerve 09/03/2020  ? Mixed hyperlipidemia 09/03/2020  ? Sensory hearing loss, bilateral 09/03/2020  ? Tear film insufficiency 09/03/2020  ? Health maintenance examination 06/27/2020  ? Secondary hyperparathyroidism of renal origin ([place]) 04/24/2020  ? Anemia in chronic kidney disease 03/10/2020  ? Benign hypertensive kidney disease with chronic kidney disease 03/10/2020  ? Hyposmolality and/or hyponatremia 03/10/2020  ? Stage 3b chronic kidney disease ([place]) 03/10/2020  ? Calf tenderness   ? Acute on chronic heart failure ([place]) 02/27/2020  ? Leg edema   ?  Type 2 diabetes mellitus with hyperlipidemia ([place]) 02/26/2020  ? CKD (chronic kidney disease) stage 4, GFR 15-29 ml/min (HCC) 02/26/2020  ? Hyponatremia 02/26/2020  ? Anasarca 02/26/2020  ? Proteinuria 02/26/2020  ? Hypoalbuminemia 02/26/2020  ? Elevated troponin 02/26/2020  ? Acute on chronic systolic CHF (congestive heart failure) ([place]) 02/26/2020  ? Aortic atherosclerosis ([place]) 02/18/2020  ? Hyperglycemia   ? Nausea   ? Hypertensive emergency   ? Myalgia due to statin 12/04/2019  ? Hypertensive urgency 11/24/2019  ? Chest pain 11/23/2019  ? OSA (obstructive sleep apnea) 04/10/2019  ? Mild episode of recurrent major depressive disorder ([place]) 04/10/2019  ? ACS (acute coronary syndrome) ([place]) 03/24/2019  ? COVID-19 virus infection 03/24/2019  ? Hypertensive heart/kidney disease w/chronic kidney disease stage III ([place]) 10/21/2017  ? Mild nonproliferative diabetic retinopathy of right eye associated with type 2 diabetes mellitus ([place]) 09/09/2017  ? Genital herpes 05/07/2017  ? DVT (deep venous thrombosis) ([place]) 12/30/2016  ? History of DVT (deep vein thrombosis) 12/30/2016  ? Hx of CABG 2018  ? PVD (posterior vitreous detachment), right eye 01/09/2016  ? Diabetic peripheral angiopathy ([place]) 01/03/2016  ? Age-related nuclear cataract of both eyes 10/24/2015  ? Chronic nonintractable headache 10/02/2015  ? Dry eyes, bilateral 02/13/2015  ? Proptosis 12/18/2014  ? Iritis of right eye 12/03/2014  ? Anemia 08/30/2014  ? Depression 08/30/2014  ? ?PCP:  Terry Ruths, MD ?Pharmacy:   ?[facility] [place] [address] ?[place] [address] ?Phone: [phone] Fax: [phone] ? ?[place], [place] ?[place] ?[place] [address] ?Phone: [phone] Fax: [phone] ? ?[facility] ([facility]), [place] [facility] [place] ?[place] ([place]) [place] [address] ?Phone:  [phone] Fax: [phone] ? ? ? ? ?Social Determinants of Health (SDOH) Interventions ?  ? ?Readmission Risk Interventions ? ?  10/29/2021  ? 10:30 AM 11/03/2020  ?  1:41 PM 10/23/2020  ? 10:35 AM  ?Readmission Risk Prevention Plan  ?Transportation Screening Complete Complete Complete  ?Medication Review Press photographer) Complete Complete Complete  ?PCP or Specialist appointment within 3-5 days of discharge Complete Not Complete Complete  ?PCP/Specialist Appt Not Complete comments  To be scheduled by unit secretary.   ?[place] or Home Care Consult  Complete Complete  ?SW Recovery Care/Counseling Consult Complete Complete Complete  ?Palliative Care Screening Not Applicable Not Applicable Not Applicable  ?[place] Not Applicable Patient Refused Not Applicable  ?SNF Comments  Patient refuses states she is able to take care of herself.   ? ? ? ?

## 2022-04-15 NOTE — Progress Notes (Signed)
Hemodialysis Post Treatment Note ? ?Access: Right AVF ? ?Scheduled Time: 2.5 hours ? ?UF Removed: 1-liter ? ?Next Scheduled Treatment: 04/16/22 ? ?Patient completes unscheduled treatment due to SOB. Tolerates treatment, without incident, AVF cannulates with ease. High arterial pressure causing reduced blood flow rate throughout treatment. Targeted UF reached with 1-liter fluid removed. Minimal bleeding post treatment. Labs drawn per order. Patient transported to assigned room.  ? ?

## 2022-04-16 DIAGNOSIS — R112 Nausea with vomiting, unspecified: Principal | ICD-10-CM

## 2022-04-16 DIAGNOSIS — R799 Abnormal finding of blood chemistry, unspecified: Secondary | ICD-10-CM

## 2022-04-16 DIAGNOSIS — Z992 Dependence on renal dialysis: Secondary | ICD-10-CM

## 2022-04-16 DIAGNOSIS — E785 Hyperlipidemia, unspecified: Secondary | ICD-10-CM

## 2022-04-16 DIAGNOSIS — K219 Gastro-esophageal reflux disease without esophagitis: Secondary | ICD-10-CM | POA: Diagnosis not present

## 2022-04-16 DIAGNOSIS — N186 End stage renal disease: Secondary | ICD-10-CM

## 2022-04-16 DIAGNOSIS — N2581 Secondary hyperparathyroidism of renal origin: Secondary | ICD-10-CM | POA: Diagnosis not present

## 2022-04-16 DIAGNOSIS — R531 Weakness: Secondary | ICD-10-CM | POA: Diagnosis not present

## 2022-04-16 DIAGNOSIS — D631 Anemia in chronic kidney disease: Secondary | ICD-10-CM | POA: Diagnosis not present

## 2022-04-16 DIAGNOSIS — E1165 Type 2 diabetes mellitus with hyperglycemia: Secondary | ICD-10-CM

## 2022-04-16 DIAGNOSIS — I10 Essential (primary) hypertension: Secondary | ICD-10-CM

## 2022-04-16 DIAGNOSIS — Z794 Long term (current) use of insulin: Secondary | ICD-10-CM

## 2022-04-16 LAB — RENAL FUNCTION PANEL
Albumin: 2.8 g/dL — ABNORMAL LOW (ref 3.5–5.0)
Anion gap: 9 (ref 5–15)
BUN: 58 mg/dL — ABNORMAL HIGH (ref 6–20)
CO2: 29 mmol/L (ref 22–32)
Calcium: 7.8 mg/dL — ABNORMAL LOW (ref 8.9–10.3)
Chloride: 97 mmol/L — ABNORMAL LOW (ref 98–111)
Creatinine, Ser: 5.19 mg/dL — ABNORMAL HIGH (ref 0.44–1.00)
GFR, Estimated: 9 mL/min — ABNORMAL LOW (ref >60)
Glucose, Bld: 101 mg/dL — ABNORMAL HIGH (ref 70–99)
Phosphorus: 4.8 mg/dL — ABNORMAL HIGH (ref 2.5–4.6)
Potassium: 3.9 mmol/L (ref 3.5–5.1)
Sodium: 135 mmol/L (ref 135–145)

## 2022-04-16 LAB — HEPATITIS B SURFACE ANTIBODY,QUALITATIVE: Hep B S Ab: REACTIVE — AB

## 2022-04-16 LAB — HEPATITIS B SURFACE ANTIBODY, QUANTITATIVE: Hep B S AB Quant (Post): 76.5 m[IU]/mL (ref >9.9)

## 2022-04-16 LAB — GLUCOSE, CAPILLARY
Glucose-Capillary: 64 mg/dL — ABNORMAL LOW (ref 70–99)
Glucose-Capillary: 150 mg/dL — ABNORMAL HIGH (ref 70–99)

## 2022-04-16 MED ORDER — TRAZODONE HCL 50 MG PO TABS
25.0000 mg | ORAL_TABLET | Freq: Every day | ORAL | Status: AC
Start: 2022-04-16 — End: ?

## 2022-04-16 NOTE — Progress Notes (Signed)
Inpatient Diabetes Program Recommendations ? ?AACE/ADA: New Consensus Statement on Inpatient Glycemic Control (2015) ? ?Target Ranges:  Prepandial:   less than 140 mg/dL ?     Peak postprandial:   less than 180 mg/dL (1-2 hours) ?     Critically ill patients:  140 - 180 mg/dL  ? ? Latest Reference Range & Units 04/15/22 09:28 04/15/22 14:39 04/15/22 17:10 04/16/22 08:21  ?Glucose-Capillary 70 - 99 mg/dL 305 (H) ? ?15 units 70/30 Insulin 142 (H) ? ?15 units 70/30 Insulin 130 (H) ? ?15 units 70/30 Insulin 64 (L) ? ?Refused 70/30 Insulin this AM  ? ? ? ?Home DM Meds: 70/30 Insulin 15 units BID (pt states TID) ? ?Current Orders: 70/30 Insulin 15 units TID ? ? ? ?MD- Note Hypoglycemia this AM ? ?Please consider reducing the 70/30 Insulin to 10 units TID ? ? ? ?--Will follow patient during hospitalization-- ? ?Wyn Quaker RN, MSN, CDE ?Diabetes Coordinator ?Inpatient Glycemic Control Team ?Team Pager: [phone] (8a-5p) ? ?

## 2022-04-16 NOTE — TOC Progression Note (Signed)
Transition of Care (TOC) - Progression Note  ? ? ?Patient Details  ?Name: Terry Arroyo ?MRN: [id number] ?Date of Birth: Apr 22, 1961 ? ?Transition of Care (TOC) CM/SW Contact  ?Conception Oms, RN ?Phone Number: ?04/16/2022, 3:03 PM ? ?Clinical Narrative:    ? ?Provided with a taxi Voucher to go home ? ?Expected Discharge Plan: [place] ?  ? ?Expected Discharge Plan and Services ?Expected Discharge Plan: [place] ?  ?  ?  ?Living arrangements for the past 2 months: [place] ?Expected Discharge Date: 04/16/22               ?  ?  ?  ?  ?  ?  ?  ?  ?  ?  ? ? ?Social Determinants of Health (SDOH) Interventions ?  ? ?Readmission Risk Interventions ? ?  10/29/2021  ? 10:30 AM 11/03/2020  ?  1:41 PM 10/23/2020  ? 10:35 AM  ?Readmission Risk Prevention Plan  ?Transportation Screening Complete Complete Complete  ?Medication Review Press photographer) Complete Complete Complete  ?PCP or Specialist appointment within 3-5 days of discharge Complete Not Complete Complete  ?PCP/Specialist Appt Not Complete comments  To be scheduled by unit secretary.   ?[place] or Home Care Consult  Complete Complete  ?SW Recovery Care/Counseling Consult Complete Complete Complete  ?Palliative Care Screening Not Applicable Not Applicable Not Applicable  ?[name] Not Applicable Patient Refused Not Applicable  ?SNF Comments  Patient refuses states she is able to take care of herself.   ? ? ?

## 2022-04-16 NOTE — Progress Notes (Signed)
Pt discharge to home via taxi voucher. ?

## 2022-04-16 NOTE — Evaluation (Signed)
Physical Therapy Evaluation ?Patient Details ?Name: Terry Arroyo ?MRN: [id number] ?DOB: October 18, 1961 ?Today's Date: 04/16/2022 ? ?History of Present Illness ? Pt is a 61 y/o F admitted on 04/14/22 after presenting with c/o weakness & N&V for a couple of days. Pt was unable to attend her regularly scheduled HD due to knee pain s/p fall; x-ray was negative for acute changes. PMH: ESRD, COPD, systolic CHF, CAD, depression, GERD, PVD, OSA, CVA  ?Clinical Impression ? Pt seen for PT evaluation with pt reporting she lives alone in an apartment on the 2nd level with elevator access & ambulates with PRN use of rollator. On this date pt ambulates a lap around the nurses station with supervision fade to mod I with pt demonstrating decreased heel strike & decreased reciprocal arm swing LUE (pt with hx of CVA affecting L side).  Pt does not require f/u therapy at this time. PT to sign off at this time.  ?   ? ?Recommendations for follow up therapy are one component of a multi-disciplinary discharge planning process, led by the attending physician.  Recommendations may be updated based on patient status, additional functional criteria and insurance authorization. ? ?Follow Up Recommendations No PT follow up ? ?  ?Assistance Recommended at Discharge PRN  ?Patient can return home with the following ? Assist for transportation ? ?  ?Equipment Recommendations None recommended by PT  ?Recommendations for Other Services ?    ?  ?Functional Status Assessment Patient has not had a recent decline in their functional status  ? ?  ?Precautions / Restrictions Precautions ?Precautions: None ?Restrictions ?Weight Bearing Restrictions: No  ? ?  ? ?Mobility ? Bed Mobility ?  ?  ?  ?  ?  ?  ?  ?General bed mobility comments: not observed, pt received & left sitting on EOB ?  ? ?Transfers ?Overall transfer level: Modified independent ?Equipment used: None ?  ?  ?  ?  ?  ?  ?  ?General transfer comment: STS without AD ?   ? ?Ambulation/Gait ?Ambulation/Gait assistance: Supervision, Modified independent (Device/Increase time) ?Gait Distance (Feet): 200 Feet ?Assistive device: None ?Gait Pattern/deviations: Decreased step length - right, Decreased step length - left, Decreased stride length, Decreased dorsiflexion - left ?Gait velocity: decreased ?  ?  ?General Gait Details: Pt initially required supervision with pt reaching out for rail in hallway for support but then able to ambulate without UE support. Pt demonstrates decreased LUE reciprocal arm swing. ? ?Stairs ?  ?  ?  ?  ?  ? ?Wheelchair Mobility ?  ? ?Modified Rankin (Stroke Patients Only) ?  ? ?  ? ?Balance Overall balance assessment: Needs assistance ?Sitting-balance support: Feet supported ?Sitting balance-Leahy Scale: Good ?  ?  ?Standing balance support: During functional activity, No upper extremity supported ?Standing balance-Leahy Scale: Good ?  ?  ?  ?  ?  ?  ?  ?  ?  ?  ?  ?  ?   ? ? ? ?Pertinent Vitals/Pain Pain Assessment ?Pain Assessment: 0-10 ?Pain Score: 2  ?Pain Location: posterior R knee ?Pain Descriptors / Indicators: Discomfort ?Pain Intervention(s): Monitored during session  ? ? ?Home Living Family/patient expects to be discharged to:: Private residence ?Living Arrangements: Alone ?Available Help at Discharge: Family;Available PRN/intermittently;Friend(s) ?Type of Home: Apartment ?Home Access: Level entry;Elevator ?  ?  ?  ?Home Layout: One level ?Home Equipment: Rollator (4 wheels);Wheelchair - manual ?   ?  ?Prior Function Prior Level of Function : Independent/Modified  Independent ?  ?  ?  ?  ?  ?  ?Mobility Comments: Pt reports she ambulates without AD but uses rollator PRN for long distances. Uses public transportation. Endorses this 1 fall in past 6 months. ?  ?  ? ? ?Hand Dominance  ?   ? ?  ?Extremity/Trunk Assessment  ? Upper Extremity Assessment ?Upper Extremity Assessment:  (pt with hx of L hemiparesis 2/2 CVA) ?  ? ?Lower Extremity  Assessment ?Lower Extremity Assessment: Generalized weakness (pt with hx of L hemiparesis 2/2 CVA) ?  ? ?   ?Communication  ?    ?Cognition Arousal/Alertness: Awake/alert ?Behavior During Therapy: [facility] for tasks assessed/performed ?Overall Cognitive Status: Within Functional Limits for tasks assessed ?  ?  ?  ?  ?  ?  ?  ?  ?  ?  ?  ?  ?  ?  ?  ?  ?  ?  ?  ? ?  ?General Comments General comments (skin integrity, edema, etc.): Educated pt on safety within the home (remove throw rugs, use reacher to get things off of floor, etc). Pt declines HHPT f/u services. ? ?  ?Exercises    ? ?Assessment/Plan  ?  ?PT Assessment Patient does not need any further PT services  ?PT Problem List   ? ?   ?  ?PT Treatment Interventions     ? ?PT Goals (Current goals can be found in the Care Plan section)  ?Acute Rehab PT Goals ?Patient Stated Goal: go home ?PT Goal Formulation: With patient ?Time For Goal Achievement: 04/30/22 ?Potential to Achieve Goals: Good ? ?  ?Frequency   ?  ? ? ?Co-evaluation   ?  ?  ?  ?  ? ? ?  ?AM-PAC PT "6 Clicks" Mobility  ?Outcome Measure Help needed turning from your back to your side while in a flat bed without using bedrails?: None ?Help needed moving from lying on your back to sitting on the side of a flat bed without using bedrails?: None ?Help needed moving to and from a bed to a chair (including a wheelchair)?: None ?Help needed standing up from a chair using your arms (e.g., wheelchair or bedside chair)?: None ?Help needed to walk in hospital room?: None ?Help needed climbing 3-5 steps with a railing? : A Little ?6 Click Score: 23 ? ?  ?End of Session   ?Activity Tolerance: Patient tolerated treatment well ?Patient left:  (sitting EOB with all needs in reach) ?  ?  ?  ? ?Time: 5631-4970 ?PT Time Calculation (min) (ACUTE ONLY): 8 min ? ? ?Charges:   PT Evaluation ?$PT Eval Low Complexity: 1 Low ?  ?  ?   ? ? ?Lavone Nian, PT, DPT ?04/16/22, 2:57 PM ? ? ?[name] ?04/16/2022, 2:56 PM ? ?

## 2022-04-16 NOTE — Progress Notes (Signed)
PT Cancellation Note ? ?Patient Details ?Name: Terry Arroyo ?MRN: [id number] ?DOB: [birth year] ? ? ?Cancelled Treatment:    Reason Eval/Treat Not Completed: Patient at procedure or test/unavailable PT orders received, chart reviewed. Attempted to see pt for PT evaluation but pt off unit for hemodialysis. Will f/u as able as pt is available. ? ?Lavone Nian, PT, DPT ?04/16/22, 9:27 AM ? ? ?[name] ?04/16/2022, 9:27 AM ?

## 2022-04-16 NOTE — Progress Notes (Signed)
?[facility]  ?ROUNDING NOTE  ? ?Subjective:  ? ?Terry Arroyo is a 61 year old African-American female with past medical history concerning GERD, CAD, systolic CHF, COPD, CVA, and end-stage renal disease on hemodialysis.  Patient presents to the emergency department with complaints of right knee pain after fall along with nausea and vomiting.  Patient has been admitted under observation for Weakness [R53.1] ?Elevated BUN [R79.9] ?Nausea and vomiting [R11.2] ?Nausea and vomiting, unspecified vomiting type [R11.2] ? ?Patient is known to our clinic and [facility], supervised by Dr. Juleen China.   ? ?Patient seen sitting at side of bed ?Eating graham crackers, peanut butter and juice for low glucose ? ?Patient evaluated later during dialysis ?  ?HEMODIALYSIS FLOWSHEET: ? ?Blood Flow Rate (mL/min): 400 mL/min ?Arterial Pressure (mmHg): -210 mmHg ?Venous Pressure (mmHg): 180 mmHg ?Transmembrane Pressure (mmHg): 50 mmHg ?Ultrafiltration Rate (mL/min): 500 mL/min ?Dialysate Flow Rate (mL/min): 600 ml/min ?Conductivity: Machine : 13.8 ?Conductivity: Machine : 13.8 ?Dialysis Fluid Bolus: Normal Saline ?Bolus Amount (mL): 250 mL ?  ?Knee soreness remains, but able to ambulate with assistance ? ? ?Objective:  ?Vital signs in last 24 hours:  ?Temp:  [97.7 ?F (36.5 ?C)-98.8 ?F (37.1 ?C)] 98.1 ?F (36.7 ?C) (04/28 7494) ?Pulse Rate:  [58-70] 66 (04/28 1115) ?Resp:  [13-23] 23 (04/28 1115) ?BP: (132-168)/(61-106) 136/61 (04/28 1115) ?SpO2:  [95 %-98 %] 95 % (04/28 0945) ?Weight:  [76.8 kg-77.9 kg] 77.9 kg (04/28 0925) ? ?Weight change: 0.596 kg ?Filed Weights  ? 04/14/22 1413 04/15/22 1325 04/16/22 0925  ?Weight: 76.2 kg 76.8 kg 77.9 kg  ? ? ?Intake/Output: ?I/O last 3 completed shifts: ?In: 980.5 [P.O.:480; IV Piggyback:500.5] ?Out: 1000 [Other:1000] ?  ?Intake/Output this shift: ? No intake/output data recorded. ? ?Physical Exam: ?General: NAD, resting  comfortably  ?Head: Normocephalic, atraumatic. Moist oral mucosal membranes  ?Eyes: Anicteric  ?Lungs:  Clear to auscultation, normal effort, room air  ?Heart: Regular rate and rhythm  ?Abdomen:  Soft, nontender, nondistended  ?Extremities: Right knee mild edema.  ?Neurologic: Nonfocal, moving all four extremities  ?Skin: No lesions  ?Access: Right AVF  ? ? ?Basic Metabolic Panel: ?Recent Labs  ?Lab 04/14/22 ?1418 04/15/22 ?4967 04/16/22 ?0346  ?NA 132* 130* 135  ?K 4.6 5.0 3.9  ?CL 92* 94* 97*  ?CO2 '25 23 29  '$ ?GLUCOSE 320* 328* 101*  ?BUN 69* 77* 58*  ?CREATININE 6.22* 6.79* 5.19*  ?CALCIUM 8.7* 8.5* 7.8*  ?PHOS  --   --  4.8*  ? ? ? ?Liver Function Tests: ?Recent Labs  ?Lab 04/14/22 ?1418 04/16/22 ?0346  ?AST 17  --   ?ALT 20  --   ?ALKPHOS 86  --   ?BILITOT 0.9  --   ?PROT 7.5  --   ?ALBUMIN 3.5 2.8*  ? ? ?No results for input(s): LIPASE, AMYLASE in the last 168 hours. ?No results for input(s): AMMONIA in the last 168 hours. ? ?CBC: ?Recent Labs  ?Lab 04/14/22 ?1418 04/14/22 ?2108 04/15/22 ?5916 04/15/22 ?1432  ?WBC 9.5  --  7.7  --   ?HGB 11.0* 11.7* 11.3* 11.1*  ?HCT 35.5* 36.5 36.2 35.0*  ?MCV 86.8  --  86.4  --   ?PLT 197  --  195  --   ? ? ? ?Cardiac Enzymes: ?No results for input(s): CKTOTAL, CKMB, CKMBINDEX, TROPONINI in the last 168 hours. ? ?BNP: ?Invalid input(s): POCBNP ? ?CBG: ?Recent Labs  ?Lab 04/15/22 ?0928 04/15/22 ?1439 04/15/22 ?1710 04/16/22 ?3846  ?  GLUCAP 305* 142* 130* 64*  ? ? ? ?Microbiology: ?Results for orders placed or performed during the hospital encounter of 03/29/22  ?Resp Panel by RT-PCR (Flu A&B, Covid) Nasopharyngeal Swab     Status: None  ? Collection Time: 03/29/22  8:55 AM  ? Specimen: Nasopharyngeal Swab; Nasopharyngeal(NP) swabs in vial transport medium  ?Result Value Ref Range Status  ? SARS Coronavirus 2 by RT PCR NEGATIVE NEGATIVE Final  ?  Comment: (NOTE) ?SARS-CoV-2 target nucleic acids are NOT DETECTED. ? ?The SARS-CoV-2 RNA is generally detectable in upper  respiratory ?specimens during the acute phase of infection. The lowest ?concentration of SARS-CoV-2 viral copies this assay can detect is ?138 copies/mL. A negative result does not preclude SARS-Cov-2 ?infection and should not be used as the sole basis for treatment or ?other patient management decisions. A negative result may occur with  ?improper specimen collection/handling, submission of specimen other ?than nasopharyngeal swab, presence of viral mutation(s) within the ?areas targeted by this assay, and inadequate number of viral ?copies(<138 copies/mL). A negative result must be combined with ?clinical observations, patient history, and epidemiological ?information. The expected result is Negative. ? ?Fact Sheet for Patients:  ?EntrepreneurPulse.com.au ? ?Fact Sheet for Healthcare Providers:  ?IncredibleEmployment.be ? ?This test is no t yet approved or cleared by the [place] FDA and  ?has been authorized for detection and/or diagnosis of SARS-CoV-2 by ?FDA under an Emergency Use Authorization (EUA). This EUA will remain  ?in effect (meaning this test can be used) for the duration of the ?COVID-19 declaration under Section 564(b)(1) of the Act, 21 ?U.S.C.section 360bbb-3(b)(1), unless the authorization is terminated  ?or revoked sooner.  ? ? ?  ? Influenza A by PCR NEGATIVE NEGATIVE Final  ? Influenza B by PCR NEGATIVE NEGATIVE Final  ?  Comment: (NOTE) ?The Xpert Xpress SARS-CoV-2/FLU/RSV plus assay is intended as an aid ?in the diagnosis of influenza from Nasopharyngeal swab specimens and ?should not be used as a sole basis for treatment. Nasal washings and ?aspirates are unacceptable for Xpert Xpress SARS-CoV-2/FLU/RSV ?testing. ? ?Fact Sheet for Patients: ?EntrepreneurPulse.com.au ? ?Fact Sheet for Healthcare Providers: ?IncredibleEmployment.be ? ?This test is not yet approved or cleared by the [place] FDA and ?has been  authorized for detection and/or diagnosis of SARS-CoV-2 by ?FDA under an Emergency Use Authorization (EUA). This EUA will remain ?in effect (meaning this test can be used) for the duration of the ?COVID-19 declaration under Section 564(b)(1) of the Act, 21 U.S.C. ?section 360bbb-3(b)(1), unless the authorization is terminated or ?revoked. ? ?Performed at [facility], [place], ?[place] [address] ?  ? ? ?Coagulation Studies: ?No results for input(s): LABPROT, INR in the last 72 hours. ? ?Urinalysis: ?No results for input(s): COLORURINE, LABSPEC, [place], GLUCOSEU, HGBUR, BILIRUBINUR, KETONESUR, PROTEINUR, UROBILINOGEN, NITRITE, LEUKOCYTESUR in the last 72 hours. ? ?Invalid input(s): APPERANCEUR  ? ? ?Imaging: ?DG Knee Complete 4 Views Right ? ?Result Date: 04/14/2022 ?CLINICAL DATA:  RIGHT knee pain. EXAM: RIGHT KNEE - COMPLETE 4+ VIEW COMPARISON:  None. FINDINGS: No fracture of the proximal tibia or distal femur. Patella is normal. No joint effusion. Linear calcifications in the medial and lateral compartment. IMPRESSION: 1.  No fracture or dislocation. 2. Chondrocalcinosis of the mediolateral compartment. Electronically Signed   By: Suzy Bouchard M.D.   On: 04/14/2022 14:45   ? ? ?Medications:  ? ? ? ? amLODipine  10 mg Oral Daily  ? ammonium lactate  1 application. Topical Q M,W,F  ? atorvastatin  80 mg Oral q1800  ? carvedilol  25 mg Oral BID  ? Chlorhexidine Gluconate Cloth  6 each Topical Q0600  ? famotidine  40 mg Oral Daily  ? heparin  5,000 Units Subcutaneous Q8H  ? insulin aspart protamine- aspart  15 Units Subcutaneous TID WC  ? isosorbide mononitrate  30 mg Oral Daily  ? metoCLOPramide (REGLAN) injection  10 mg Intravenous Q6H  ? mirtazapine  30 mg Oral QHS  ? multivitamin  1 tablet Oral Q M,W,F  ? ondansetron (ZOFRAN) IV  4 mg Intravenous Once  ? sucralfate  1 g Oral BID AC  ? topiramate  25 mg Oral Daily  ? traZODone  50 mg Oral QHS  ? vitamin B-12  1,000 mcg Oral Daily   ? ?acetaminophen **OR** acetaminophen, gabapentin, hyoscyamine, lidocaine-prilocaine, magnesium hydroxide, ondansetron **OR** ondansetron (ZOFRAN) IV, oxyCODONE-acetaminophen, polyethylene glycol, traZODone ? ?Assessment/ Plan:  ?M

## 2022-04-16 NOTE — Progress Notes (Signed)
Hemodialysis Post Treatment Note ? ?April 16, 2022 ? ?Access: RUA AVF  ? ?UF Removed: 1-liter  ? ?Treatment Time: 3 Hours ? ?Next Scheduled Treatment: TBD ? ?Note: ?Patient completes scheduled treatment without incident.  RUA AVF cannulates with ease; prescribed BFR maintained throughout treatment. Targeted UF met. Meds given per order. Patient anticipating discharge to home.   ?

## 2022-04-16 NOTE — Progress Notes (Signed)
CBG 64 this AM, pt going for dialysis and ordered breakfast.  Pt refused insulin and orange juice and Javaeh Muscatello crackers with peanut butter given. ?

## 2022-04-16 NOTE — Discharge Summary (Signed)
? ? ?Physician Discharge Summary  ?Patient: Terry Arroyo TKZ:[id number] DOB: 07/19/1961   Code Status: Prior ?Admit date: 04/14/2022 ?Discharge date: 04/18/2022 ?Disposition: Home, No home health services recommended ?PCP: Kirk Ruths, MD ? ?Recommendations for Outpatient Follow-up:  ?Follow up with PCP within 1-2 weeks for hospital follow up ?Follow up with nephrology for routine HD ? ?Discharge Diagnoses:  ?Principal Problem: ?  Nausea and vomiting ?Active Problems: ?  Generalized weakness ?  Essential hypertension ?  End-stage renal disease on hemodialysis ([place]) ?  Uncontrolled type 2 diabetes mellitus with hyperglycemia, with long-term current use of insulin ([place]) ?  Dyslipidemia ?  GERD without esophagitis ?  Elevated BUN ?  Weakness ? ?Brief Hospital Course Summary: ?Terry Arroyo is a 61 y.o. female with a PMH significant for ESRD (MWF), COPD, systolic CHF, CAD, depression, GERD, PVD, OSA, CVA. ?  ?They presented from home to the ED on 04/14/2022 with weakness, N/V x a couple days. She was not able to attend her regularly scheduled HD due to illness and had knee pain after a fall. ?  ?In the ED, it was found that they had normal vital signs with hypertension, mild hyponatremia 132 and hypochloremia of 92 with blood glucose of 320 and BUN of 69 up from 29 on 4/18 with creatinine of 6.22 calcium 8.7.  CBC showed mild anemia close to baseline. R knee xray without acute change.  ?  ?They were initially treated with 500cc NS bolus and nephrology was consulted for routine HD since she had missed her most recent session.  ? ?Her symptoms improved rapidly with supportive care only. The symptoms were likely due to a viral gastroenteritis.  ? ?On day of discharge, patient was enduring a normal HD session, tolerating regular diet, and denied abdominal pain. Vital signs were stable.  ? ?All other chronic conditions were treated with home medications.   ? ?Discharge Condition: Good, improved ?Recommended  discharge diet: Regular healthy diet ? ?Consultations: ?Nephrology  ? ?Procedures/Studies: ?HD ? ? ?Allergies as of 04/16/2022   ? ?   Reactions  ? Hydralazine Hives, Shortness Of Breath, Swelling, Rash  ? Body aches  ? Kiwi Extract Hives, Swelling, Rash  ? Mouth swells   ? Lisinopril Swelling  ? Angioedema  ? Shellfish Allergy Anaphylaxis  ? Shrimp/lobster  ?Betadine leaves welts on skin   ? Betadine [povidone Iodine] Hives, Rash  ? LEAVES WELTS ON SKIN  ? Septra [sulfamethoxazole-trimethoprim]   ? Metformin Diarrhea, Other (See Comments)  ? GI Intolerance  ? Tape Itching  ? Use paper tape whenever possible  ? ?  ? ?  ?Medication List  ?  ? ?STOP taking these medications   ? ?amoxicillin 500 MG capsule ?Commonly known as: AMOXIL ?  ?doxycycline 100 MG tablet ?Commonly known as: VIBRA-TABS ?  ?HYDROcodone-acetaminophen 10-325 MG tablet ?Commonly known as: NORCO ?  ?methocarbamol 500 MG tablet ?Commonly known as: Robaxin ?  ?omeprazole 40 MG capsule ?Commonly known as: PRILOSEC ?  ? ?  ? ?TAKE these medications   ? ?acetaminophen 500 MG tablet ?Commonly known as: TYLENOL ?Take 1,000 mg by mouth every 6 (six) hours as needed for mild pain or moderate pain. ?  ?albuterol 108 (90 Base) MCG/ACT inhaler ?Commonly known as: VENTOLIN HFA ?Inhale 2 puffs into the lungs every 6 (six) hours as needed for wheezing or shortness of breath. ?  ?amLODipine 10 MG tablet ?Commonly known as: NORVASC ?Take 1 tablet (10 mg total) by mouth daily. ?  ?  ammonium lactate 12 % lotion ?Commonly known as: LAC-HYDRIN ?Apply 1 application topically every Monday, Wednesday, and Friday. ?  ?aspirin 81 MG EC tablet ?Take 1 tablet (81 mg total) by mouth daily. ?  ?atorvastatin 80 MG tablet ?Commonly known as: LIPITOR ?Take 1 tablet (80 mg total) by mouth daily at 6 PM. ?  ?carvedilol 25 MG tablet ?Commonly known as: COREG ?Take 25 mg by mouth 2 (two) times daily. ?  ?famotidine 40 MG tablet ?Commonly known as: PEPCID ?Take 1 tablet by mouth daily. ?   ?furosemide 80 MG tablet ?Commonly known as: LASIX ?Take 80 mg by mouth 4 (four) times a week. Tues, Thurs, Sat, and Sun (non-dialysis days) ?  ?gabapentin 100 MG capsule ?Commonly known as: NEURONTIN ?Take 100 mg by mouth daily as needed (pain). ?  ?hyoscyamine 0.125 MG SL tablet ?Commonly known as: LEVSIN SL ?Place 1 tablet under the tongue every 6 (six) hours as needed. ?  ?insulin aspart protamine- aspart (70-30) 100 UNIT/ML injection ?Commonly known as: NOVOLOG MIX 70/30 ?Inject 0.15 mLs (15 Units total) into the skin 2 (two) times daily with a meal. ?What changed: when to take this ?  ?isosorbide mononitrate 30 MG 24 hr tablet ?Commonly known as: IMDUR ?Take 30 mg by mouth daily. ?  ?lidocaine-prilocaine cream ?Commonly known as: EMLA ?Apply 1 application topically as needed (fistula access). ?  ?mirtazapine 30 MG tablet ?Commonly known as: REMERON ?Take 30 mg by mouth at bedtime. ?  ?multivitamin Tabs tablet ?Take 1 tablet by mouth every Monday, Wednesday, and Friday. ?  ?oxyCODONE-acetaminophen 5-325 MG tablet ?Commonly known as: PERCOCET/ROXICET ?Take 1 tablet by mouth every 6 (six) hours as needed for severe pain. ?What changed: Another medication with the same name was removed. Continue taking this medication, and follow the directions you see here. ?  ?polyethylene glycol 17 g packet ?Commonly known as: MIRALAX / GLYCOLAX ?Take 17 g by mouth daily as needed. ?  ?predniSONE 50 MG tablet ?Commonly known as: DELTASONE ?Take 1 tablet (50 mg total) by mouth daily with breakfast. ?  ?sucralfate 1 g tablet ?Commonly known as: CARAFATE ?Take 1 g by mouth 2 (two) times daily before a meal. ?  ?topiramate 25 MG tablet ?Commonly known as: TOPAMAX ?Take 25 mg by mouth daily. ?  ?traZODone 50 MG tablet ?Commonly known as: DESYREL ?Take 0.5 tablets (25 mg total) by mouth at bedtime. ?What changed: how much to take ?  ?True Metrix Blood Glucose Test test strip ?Generic drug: glucose blood ?  ?True Metrix Meter  w/Device Kit ?  ?vitamin B-12 1000 MCG tablet ?Commonly known as: CYANOCOBALAMIN ?Take 1,000 mcg by mouth daily. ?  ? ?  ? ? ? ?Subjective   ?Pt reports no abdominal pain, nausea, or vomiting. She feels well and would like to go home.  ?Objective  ?Blood pressure 135/73, pulse 67, temperature 98 ?F (36.7 ?C), resp. rate 18, height 5' 8"  (1.727 m), weight 76.8 kg, SpO2 95 %.  ? ?General: Pt is alert, awake, not in acute distress ?Cardiovascular: RRR, S1/S2 +, no rubs, no gallops ?Respiratory: CTA bilaterally, no wheezing, no rhonchi ?Abdominal: Soft, NT, ND, bowel sounds + ?Extremities: no edema, no cyanosis ? ? ?The results of significant diagnostics from this hospitalization (including imaging, microbiology, ancillary and laboratory) are listed below for reference.  ? ?Imaging studies: ?US Venous Img Lower Unilateral Right ? ?Result Date: 04/06/2022 ?CLINICAL DATA:  Right lower extremity pain. EXAM: RIGHT LOWER EXTREMITY VENOUS DOPPLER ULTRASOUND TECHNIQUE: Gray-scale  sonography with compression, as well as color and duplex ultrasound, were performed to evaluate the deep venous system(s) from the level of the common femoral vein through the popliteal and proximal calf veins. COMPARISON:  Recent similar study 03/29/2022. FINDINGS: VENOUS Normal compressibility of the common femoral, superficial femoral, and popliteal veins, as well as the visualized calf veins. Visualized portions of profunda femoral vein and great saphenous vein unremarkable. No filling defects to suggest DVT on grayscale or color Doppler imaging. Doppler waveforms show normal direction of venous flow, normal respiratory plasticity and response to augmentation. Limited views of the contralateral common femoral vein are unremarkable. OTHER Right popliteal fossa Baker's cyst is again noted, previously 4.0 x 1.3 x 2.5 cm, larger today with measurements of 7.9 x 1.5 x 3.8 cm and with greater quantity of hypoechoic debris within the cyst than previously.  There is no internal color flow. Limitations: none IMPRESSION: 1. No evidence of deep venous thrombosis in the right lower extremity. 2. Enlarging Baker's cyst with increasing hypoechoic debris. This may be seen wit

## 2022-04-18 DIAGNOSIS — Z992 Dependence on renal dialysis: Secondary | ICD-10-CM | POA: Diagnosis not present

## 2022-04-18 DIAGNOSIS — N186 End stage renal disease: Secondary | ICD-10-CM | POA: Diagnosis not present

## 2022-04-19 DIAGNOSIS — N2581 Secondary hyperparathyroidism of renal origin: Secondary | ICD-10-CM | POA: Diagnosis not present

## 2022-04-19 DIAGNOSIS — N186 End stage renal disease: Secondary | ICD-10-CM | POA: Diagnosis not present

## 2022-04-19 DIAGNOSIS — Z992 Dependence on renal dialysis: Secondary | ICD-10-CM | POA: Diagnosis not present

## 2022-04-21 DIAGNOSIS — N186 End stage renal disease: Secondary | ICD-10-CM | POA: Diagnosis not present

## 2022-04-21 DIAGNOSIS — N2581 Secondary hyperparathyroidism of renal origin: Secondary | ICD-10-CM | POA: Diagnosis not present

## 2022-04-21 DIAGNOSIS — Z992 Dependence on renal dialysis: Secondary | ICD-10-CM | POA: Diagnosis not present

## 2022-04-23 DIAGNOSIS — N2581 Secondary hyperparathyroidism of renal origin: Secondary | ICD-10-CM | POA: Diagnosis not present

## 2022-04-23 DIAGNOSIS — N186 End stage renal disease: Secondary | ICD-10-CM | POA: Diagnosis not present

## 2022-04-23 DIAGNOSIS — Z992 Dependence on renal dialysis: Secondary | ICD-10-CM | POA: Diagnosis not present

## 2022-04-26 DIAGNOSIS — Z992 Dependence on renal dialysis: Secondary | ICD-10-CM | POA: Diagnosis not present

## 2022-04-26 DIAGNOSIS — N186 End stage renal disease: Secondary | ICD-10-CM | POA: Diagnosis not present

## 2022-04-26 DIAGNOSIS — N2581 Secondary hyperparathyroidism of renal origin: Secondary | ICD-10-CM | POA: Diagnosis not present

## 2022-04-27 ENCOUNTER — Ambulatory Visit: Admission: RE | Admit: 2022-04-27 | Payer: Medicare HMO | Source: Home / Self Care

## 2022-04-27 ENCOUNTER — Encounter: Admission: RE | Payer: Self-pay | Source: Home / Self Care

## 2022-04-27 SURGERY — COLONOSCOPY WITH PROPOFOL
Anesthesia: General

## 2022-04-28 DIAGNOSIS — N2581 Secondary hyperparathyroidism of renal origin: Secondary | ICD-10-CM | POA: Diagnosis not present

## 2022-04-28 DIAGNOSIS — N186 End stage renal disease: Secondary | ICD-10-CM | POA: Diagnosis not present

## 2022-04-28 DIAGNOSIS — Z992 Dependence on renal dialysis: Secondary | ICD-10-CM | POA: Diagnosis not present

## 2022-04-30 DIAGNOSIS — N2581 Secondary hyperparathyroidism of renal origin: Secondary | ICD-10-CM | POA: Diagnosis not present

## 2022-04-30 DIAGNOSIS — N186 End stage renal disease: Secondary | ICD-10-CM | POA: Diagnosis not present

## 2022-04-30 DIAGNOSIS — Z992 Dependence on renal dialysis: Secondary | ICD-10-CM | POA: Diagnosis not present

## 2022-05-02 ENCOUNTER — Other Ambulatory Visit: Payer: Self-pay

## 2022-05-02 ENCOUNTER — Emergency Department
Admission: EM | Admit: 2022-05-02 | Discharge: 2022-05-02 | Disposition: A | Discharge status: Home / Self Care | Payer: Medicare HMO | Attending: Emergency Medicine | Admitting: Emergency Medicine

## 2022-05-02 ENCOUNTER — Encounter: Payer: Self-pay | Admitting: *Deleted

## 2022-05-02 ENCOUNTER — Emergency Department: Payer: Medicare HMO

## 2022-05-02 DIAGNOSIS — J449 Chronic obstructive pulmonary disease, unspecified: Secondary | ICD-10-CM | POA: Diagnosis not present

## 2022-05-02 DIAGNOSIS — M7121 Synovial cyst of popliteal space [Baker], right knee: Secondary | ICD-10-CM | POA: Diagnosis not present

## 2022-05-02 DIAGNOSIS — L0390 Cellulitis, unspecified: Secondary | ICD-10-CM | POA: Diagnosis not present

## 2022-05-02 DIAGNOSIS — I130 Hypertensive heart and chronic kidney disease with heart failure and stage 1 through stage 4 chronic kidney disease, or unspecified chronic kidney disease: Secondary | ICD-10-CM | POA: Diagnosis not present

## 2022-05-02 DIAGNOSIS — I509 Heart failure, unspecified: Secondary | ICD-10-CM | POA: Diagnosis not present

## 2022-05-02 DIAGNOSIS — Z951 Presence of aortocoronary bypass graft: Secondary | ICD-10-CM | POA: Insufficient documentation

## 2022-05-02 DIAGNOSIS — Z992 Dependence on renal dialysis: Secondary | ICD-10-CM | POA: Diagnosis not present

## 2022-05-02 DIAGNOSIS — R600 Localized edema: Secondary | ICD-10-CM | POA: Diagnosis not present

## 2022-05-02 DIAGNOSIS — E1122 Type 2 diabetes mellitus with diabetic chronic kidney disease: Secondary | ICD-10-CM | POA: Diagnosis not present

## 2022-05-02 DIAGNOSIS — R21 Rash and other nonspecific skin eruption: Secondary | ICD-10-CM | POA: Diagnosis not present

## 2022-05-02 DIAGNOSIS — E1165 Type 2 diabetes mellitus with hyperglycemia: Secondary | ICD-10-CM | POA: Insufficient documentation

## 2022-05-02 DIAGNOSIS — N184 Chronic kidney disease, stage 4 (severe): Secondary | ICD-10-CM | POA: Diagnosis not present

## 2022-05-02 DIAGNOSIS — I10 Essential (primary) hypertension: Secondary | ICD-10-CM | POA: Diagnosis not present

## 2022-05-02 DIAGNOSIS — M79604 Pain in right leg: Secondary | ICD-10-CM | POA: Diagnosis present

## 2022-05-02 LAB — COMPREHENSIVE METABOLIC PANEL
ALT: 15 U/L (ref 0–44)
AST: 21 U/L (ref 15–41)
Albumin: 3.3 g/dL — ABNORMAL LOW (ref 3.5–5.0)
Alkaline Phosphatase: 101 U/L (ref 38–126)
Anion gap: 11 (ref 5–15)
BUN: 28 mg/dL — ABNORMAL HIGH (ref 6–20)
CO2: 26 mmol/L (ref 22–32)
Calcium: 8.5 mg/dL — ABNORMAL LOW (ref 8.9–10.3)
Chloride: 101 mmol/L (ref 98–111)
Creatinine, Ser: 5.84 mg/dL — ABNORMAL HIGH (ref 0.44–1.00)
GFR, Estimated: 8 mL/min — ABNORMAL LOW (ref >60)
Glucose, Bld: 161 mg/dL — ABNORMAL HIGH (ref 70–99)
Potassium: 4.3 mmol/L (ref 3.5–5.1)
Sodium: 138 mmol/L (ref 135–145)
Total Bilirubin: 1.1 mg/dL (ref 0.3–1.2)
Total Protein: 7.9 g/dL (ref 6.5–8.1)

## 2022-05-02 LAB — LACTIC ACID, PLASMA: Lactic Acid, Venous: 1.4 mmol/L (ref 0.5–1.9)

## 2022-05-02 IMAGING — US US EXTREM LOW VENOUS*R*
1 series · 14 of 24 positions shown · non-contrast
Comparison: [DATE]

CLINICAL DATA: Right lower extremity pain and edema

EXAM:
RIGHT LOWER EXTREMITY VENOUS DOPPLER ULTRASOUND
TECHNIQUE: Gray-scale sonography with compression, as well as color and duplex
ultrasound, were performed to evaluate the deep venous system(s)
from the level of the common femoral vein through the popliteal and
proximal calf veins.

[Series 1: us venous img lower uni right (dvt) · portal-venous · 14 of 57 slices shown]
[im 1/57]
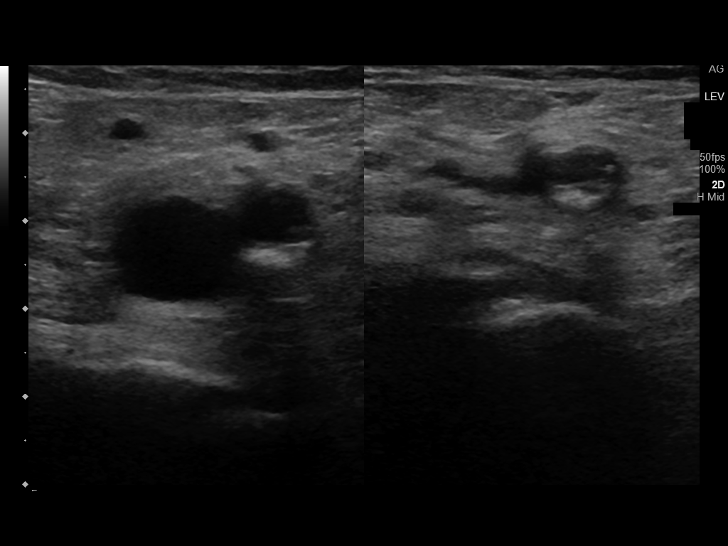
[im 5/57]
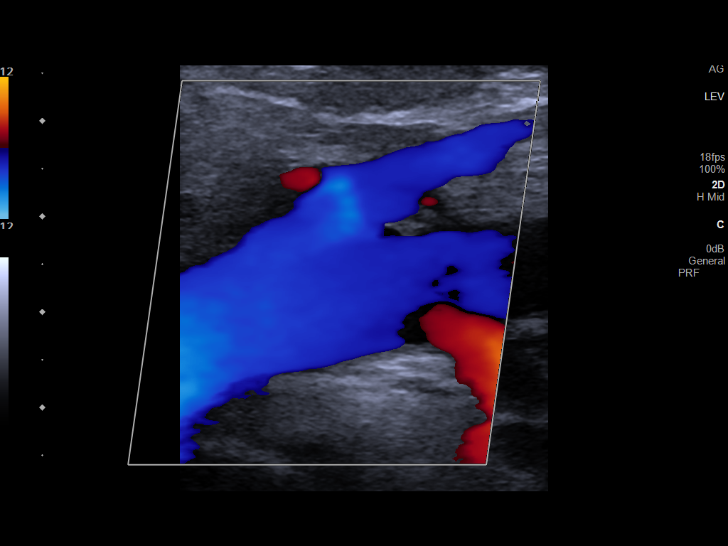
[im 10/57]
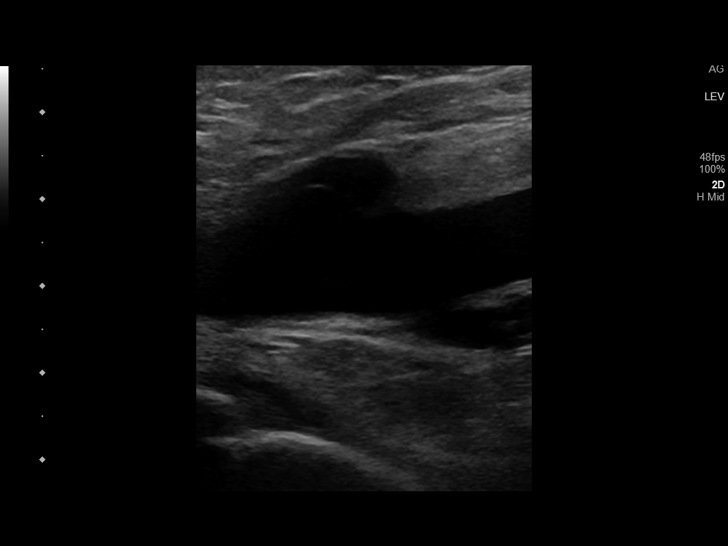
[im 15/57]
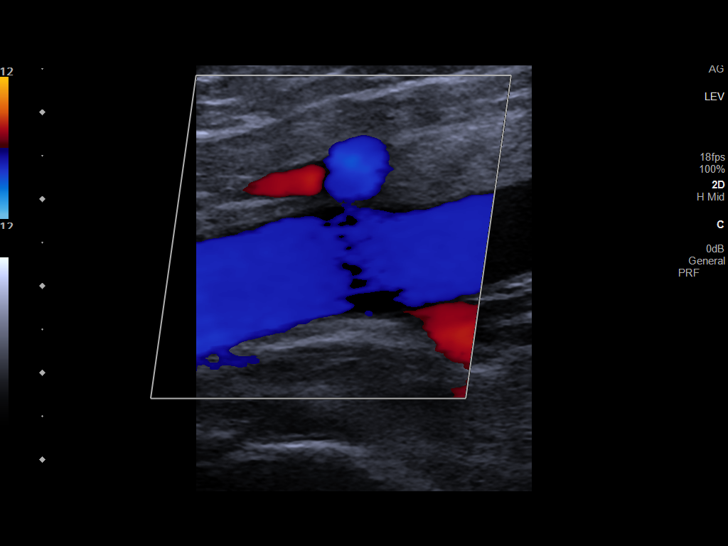
[im 18/57]
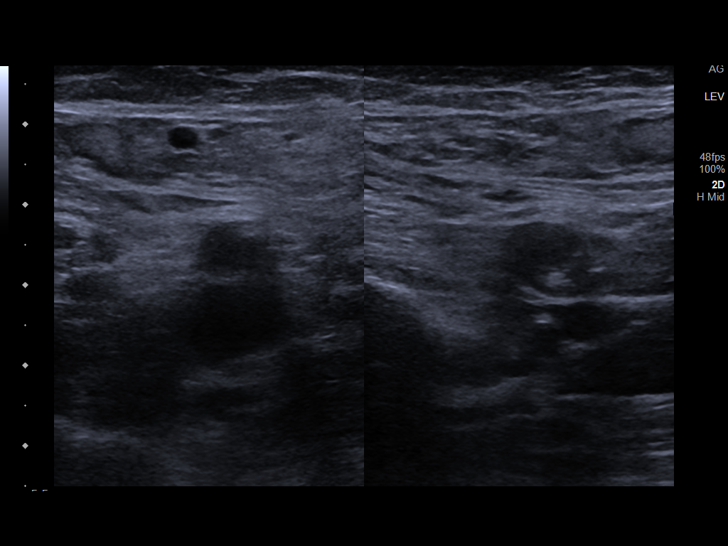
[im 22/57]
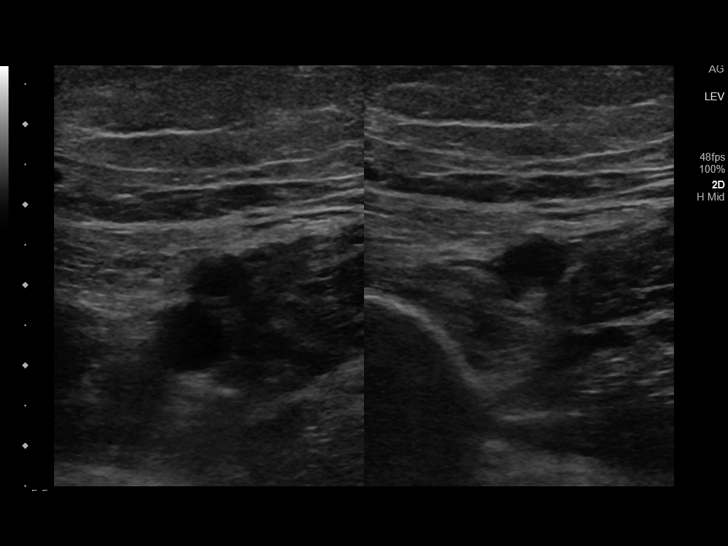
[im 27/57]
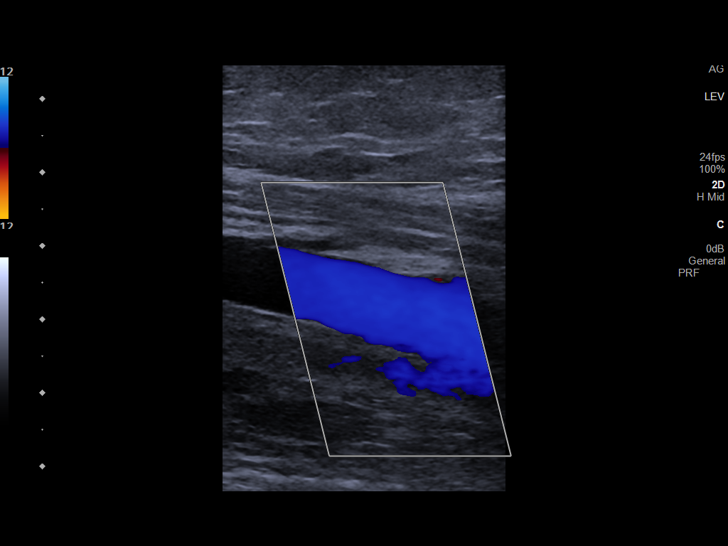
[im 30/57]
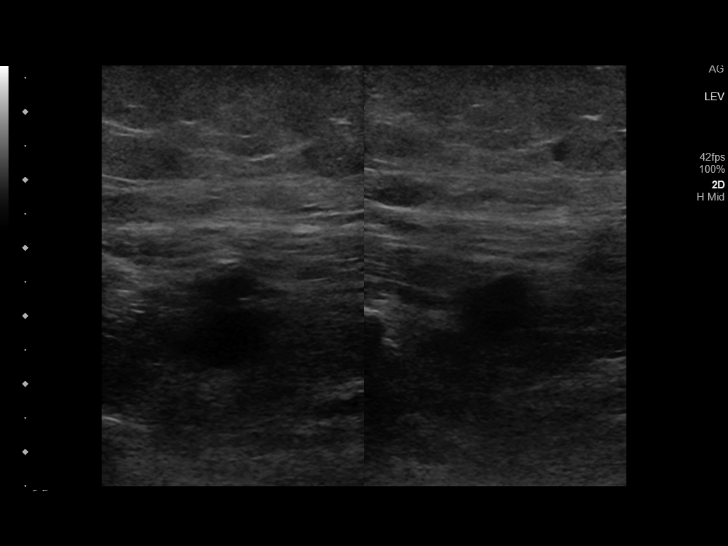
[im 35/57]
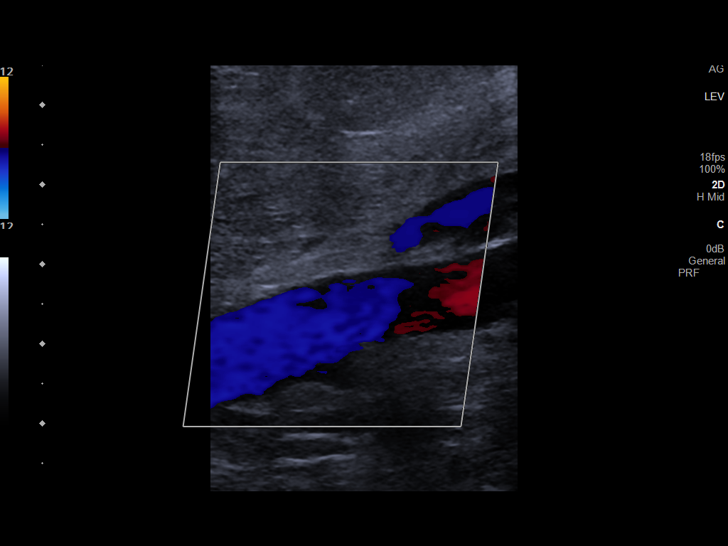
[im 39/57]
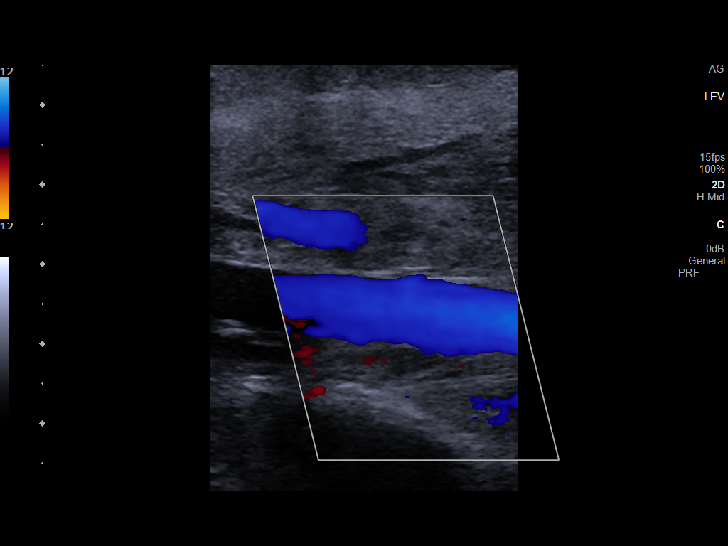
[im 44/57]
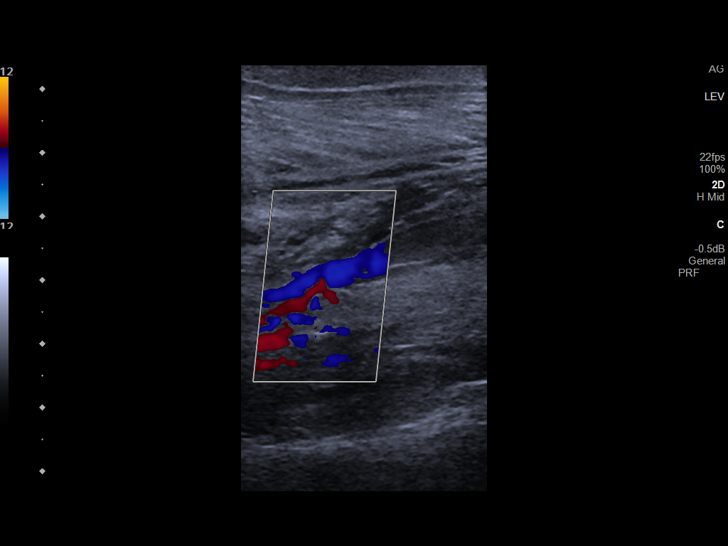
[im 47/57]
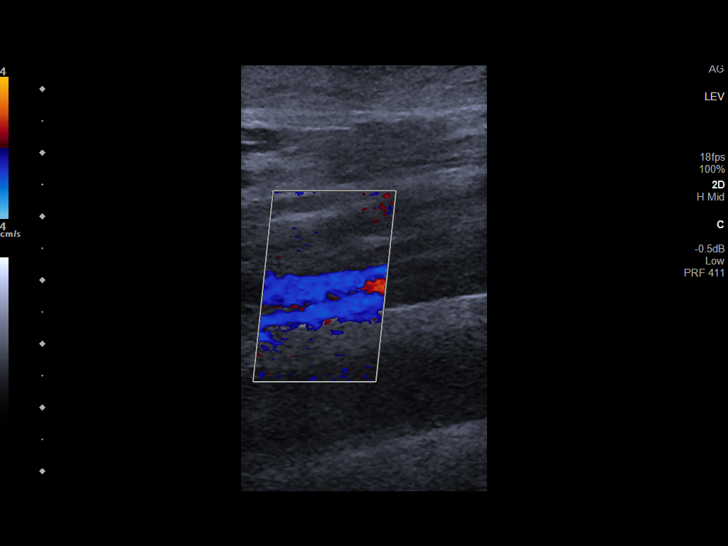
[im 52/57]
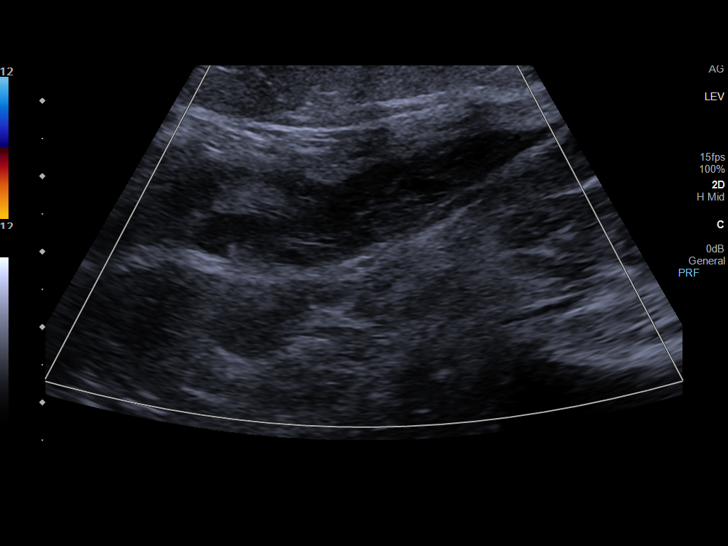
[im 57/57]
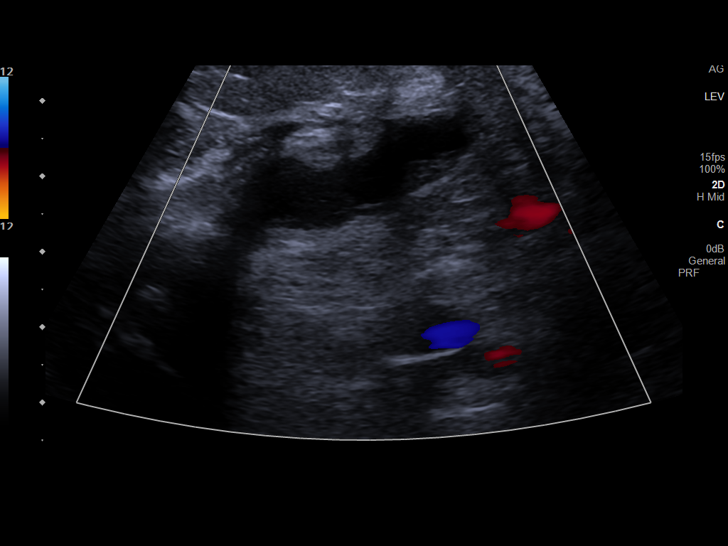

[14 of 24 positions shown; findings below may reference images not displayed]

FINDINGS: VENOUS

Normal compressibility of the common femoral, superficial femoral,
and popliteal veins, as well as the visualized calf veins.
Visualized portions of profunda femoral vein and great saphenous
vein unremarkable. No filling defects to suggest DVT on grayscale or
color Doppler imaging. Doppler waveforms show normal direction of
venous flow, normal respiratory plasticity and response to
augmentation.

Limited views of the contralateral common femoral vein are
unremarkable.

OTHER

Right popliteal fossa Baker's cyst is again noted measuring 4.1 x
1.0 x 3.0 cm. Formally 4.0 x 1.3 x 2.5 cm.

Limitations: none
IMPRESSION: 1. No evidence for right lower extremity DVT.
2. Right popliteal fossa Baker's cyst. Stable compared with
[DATE].

## 2022-05-02 MED ORDER — LIDOCAINE 5 % EX PTCH
1.0000 | MEDICATED_PATCH | CUTANEOUS | Status: DC
  Administered 2022-05-02: 1 via TRANSDERMAL
  Filled 2022-05-02: qty 1

## 2022-05-02 MED ORDER — ONDANSETRON HCL 4 MG/2ML IJ SOLN
4.0000 mg | Freq: Once | INTRAMUSCULAR | Status: DC
  Filled 2022-05-02: qty 2

## 2022-05-02 MED ORDER — OXYCODONE-ACETAMINOPHEN 5-325 MG PO TABS
1.0000 | ORAL_TABLET | Freq: Once | ORAL | Status: AC
  Administered 2022-05-02: 1 via ORAL
  Filled 2022-05-02: qty 1

## 2022-05-02 MED ORDER — LIDOCAINE 5 % EX PTCH: 1.0000 | MEDICATED_PATCH | Freq: Two times a day (BID) | CUTANEOUS | 0 refills | Status: DC

## 2022-05-02 NOTE — ED Provider Notes (Addendum)
? ?[facility] ?Provider Note ? ? ? Event Date/Time  ? First MD Initiated Contact with Patient 05/02/22 [id number]   ?  (approximate) ? ? ?History  ? ?Leg Pain ? ? ?HPI ? ?Terry Arroyo is a 61 y.o. female   to the ED via EMS with complaint of right leg pain.  Patient states that she has had history of bilateral leg pain and has been treated for cellulitis in the past.  Currently she has a Baker's cyst in her right knee and states that this also is getting worse.  She was seen at [facility] for her Baker's cyst and Dr.Kubinski, Mitzi Hansen, DO injected with steroids.  Patient states that this is not helping with her pain but looking at her records this procedure has been done once.  Has any recent injury to her right leg but states that weightbearing is painful.  Patient gets dialysis and has a history of chronic kidney disease stage IV, diabetes type 2, MI, CABG, CVA, cerebral aneurysm, CHF, COPD, GERD, reduced ejection fracture, hypertension and history of DVT.   ? ?  ? ? ?Physical Exam  ? ?Triage Vital Signs: ?ED Triage Vitals [05/02/22 0940]  ?Enc Vitals Group  ?   BP (!) 153/108  ?   Pulse Rate 72  ?   Resp   ?   Temp 98.2 ?F (36.8 ?C)  ?   Temp src   ?   SpO2 96 %  ?   Weight   ?   Height   ?   Head Circumference   ?   Peak Flow   ?   Pain Score   ?   Pain Loc   ?   Pain Edu?   ?   Excl. in [place]?   ? ? ?Most recent vital signs: ?Vitals:  ? 05/02/22 1400 05/02/22 1445  ?BP: (!) 141/57 (!) 154/74  ?Pulse: 64 62  ?Resp:  18  ?Temp:  98.3 ?F (36.8 ?C)  ?SpO2: 94% 95%  ? ? ? ?General: Awake, no distress.  ?CV:  Good peripheral perfusion.  Heart regular rate and rhythm. ?Resp:  Normal effort.  Clear bilaterally. ?Abd:  No distention.  ?Other:  On examination of the lower extremities there is no gross deformity however there is tenderness on palpation of the right lower extremity in the calf and posterior knee area.  No erythema or warmth is noted.  No pitting soft tissue edema present.  Pulses  present both DP and TP.  Skin is intact.  Motor or sensory function intact distally.  Patient's range of motion is decreased secondary to pain. ? ? ?ED Results / Procedures / Treatments  ? ?Labs ?(all labs ordered are listed, but only abnormal results are displayed) ?Labs Reviewed  ?COMPREHENSIVE METABOLIC PANEL - Abnormal; Notable for the following components:  ?    Result Value  ? Glucose, Bld 161 (*)   ? BUN 28 (*)   ? Creatinine, Ser 5.84 (*)   ? Calcium 8.5 (*)   ? Albumin 3.3 (*)   ? GFR, Estimated 8 (*)   ? All other components within normal limits  ?LACTIC ACID, PLASMA  ?CBC WITH DIFFERENTIAL/PLATELET  ?LACTIC ACID, PLASMA  ? ? ? ? ?RADIOLOGY ? ?This ultrasound of the right lower extremity was reviewed and radiology report is negative for DVT.  He does mention a Baker's cyst that appears stable compared to the last study that was done of this area. ? ? ?  PROCEDURES: ? ?Critical Care performed:  ? ?Procedures ? ? ?MEDICATIONS ORDERED IN ED: ?Medications  ?ondansetron (ZOFRAN) injection 4 mg (4 mg Intravenous Not Given 05/02/22 1024)  ?lidocaine (LIDODERM) 5 % 1 patch (1 patch Transdermal Patch Applied 05/02/22 1216)  ?oxyCODONE-acetaminophen (PERCOCET/ROXICET) 5-325 MG per tablet 1 tablet (1 tablet Oral Given 05/02/22 1116)  ? ? ? ?IMPRESSION / MDM / ASSESSMENT AND PLAN / ED COURSE  ?I reviewed the triage vital signs and the nursing notes. ? ? ?Differential diagnosis includes, but is not limited to, cellulitis, DVT, right lower extremity pain, Baker's cyst. ? ?61 year old female presents to the ED with complaint of right lower extremity pain.  Patient states she has chronic cellulitis and has been going on for approximately 2 months.  She denies any fever, chills, nausea or vomiting.  She also has been seen at [facility] and had a Baker's cyst injected with cortisone which she states has not helped with her pain.  Patient does have a history of DVT.  Ultrasound venous lower right extremity is negative for  DVT but does mention the Baker's cyst that is still present.  Patient is afebrile.  CMP showed glucose elevated at 161, BUN 28, creatinine 5.85.  Lactic acid was 1.4.  CBC was not done per lab as they state that this hemolyzed.  The initial blood collection was not enough to get a lavender tube and IV nurse got access to draw the remaining tubes.  Patient is a dialysis patient and has chronic pain leg edema.  Patient was given Percocet while in the ED and also a Lidoderm patch was applied to the back of her leg to help with the pain from her Baker's cyst.  We discussed that if the injection is not helping that she will need to make a follow-up appointment with the orthopedist at [facility] to possibly have this removed.  Patient was made aware that she does not have any DVTs and was discharged with a prescription for the Lidoderm patches. ? ? ?FINAL CLINICAL IMPRESSION(S) / ED DIAGNOSES  ? ?Final diagnoses:  ?Baker's cyst of knee, right  ? ? ? ?Rx / DC Orders  ? ?ED Discharge Orders   ? ?      Ordered  ?  lidocaine (LIDODERM) 5 %  Every 12 hours       ? 05/02/22 1419  ? ?  ?  ? ?  ? ? ? ?Note:  This document was prepared using Dragon voice recognition software and may include unintentional dictation errors. ?  ?Johnn Hai, PA-C ?05/02/22 1459 ? ?  ?Johnn Hai, PA-C ?05/02/22 1500 ? ?  ?Lucrezia Starch, MD ?05/02/22 1539 ? ?

## 2022-05-02 NOTE — Discharge Instructions (Addendum)
Call make an appointment with Dr. Rudene Christians who is on-call for [facility] or Dr. Candelaria Stagers who you have seen in the office before.  Today your ultrasound still shows your Baker's cyst but did not show any blood clots in your leg.  A prescription for pain patches was sent to the pharmacy for you to use as needed. ?

## 2022-05-02 NOTE — ED Triage Notes (Signed)
Per EMS report, patient has been treated for Baker's cyst behind right knee and cellulitis and pain is increasing. Patient c/o bilateral leg pain. ? ?162/61 ?100% on room air. ?73pulse ?

## 2022-05-03 DIAGNOSIS — N186 End stage renal disease: Secondary | ICD-10-CM | POA: Diagnosis not present

## 2022-05-03 DIAGNOSIS — N2581 Secondary hyperparathyroidism of renal origin: Secondary | ICD-10-CM | POA: Diagnosis not present

## 2022-05-03 DIAGNOSIS — Z992 Dependence on renal dialysis: Secondary | ICD-10-CM | POA: Diagnosis not present

## 2022-05-05 DIAGNOSIS — Z992 Dependence on renal dialysis: Secondary | ICD-10-CM | POA: Diagnosis not present

## 2022-05-05 DIAGNOSIS — N186 End stage renal disease: Secondary | ICD-10-CM | POA: Diagnosis not present

## 2022-05-05 DIAGNOSIS — N2581 Secondary hyperparathyroidism of renal origin: Secondary | ICD-10-CM | POA: Diagnosis not present

## 2022-05-06 DIAGNOSIS — Z992 Dependence on renal dialysis: Secondary | ICD-10-CM | POA: Diagnosis not present

## 2022-05-06 DIAGNOSIS — L03116 Cellulitis of left lower limb: Secondary | ICD-10-CM | POA: Diagnosis not present

## 2022-05-06 DIAGNOSIS — N186 End stage renal disease: Secondary | ICD-10-CM | POA: Diagnosis not present

## 2022-05-07 DIAGNOSIS — N2581 Secondary hyperparathyroidism of renal origin: Secondary | ICD-10-CM | POA: Diagnosis not present

## 2022-05-07 DIAGNOSIS — N186 End stage renal disease: Secondary | ICD-10-CM | POA: Diagnosis not present

## 2022-05-07 DIAGNOSIS — Z992 Dependence on renal dialysis: Secondary | ICD-10-CM | POA: Diagnosis not present

## 2022-05-12 ENCOUNTER — Other Ambulatory Visit (INDEPENDENT_AMBULATORY_CARE_PROVIDER_SITE_OTHER): Payer: Self-pay | Admitting: Vascular Surgery

## 2022-05-12 DIAGNOSIS — N186 End stage renal disease: Secondary | ICD-10-CM | POA: Diagnosis not present

## 2022-05-12 DIAGNOSIS — Z992 Dependence on renal dialysis: Secondary | ICD-10-CM | POA: Diagnosis not present

## 2022-05-12 DIAGNOSIS — N2581 Secondary hyperparathyroidism of renal origin: Secondary | ICD-10-CM | POA: Diagnosis not present

## 2022-05-13 ENCOUNTER — Other Ambulatory Visit: Payer: Self-pay | Admitting: Sports Medicine

## 2022-05-13 ENCOUNTER — Ambulatory Visit (INDEPENDENT_AMBULATORY_CARE_PROVIDER_SITE_OTHER): Payer: Medicare HMO

## 2022-05-13 ENCOUNTER — Ambulatory Visit (INDEPENDENT_AMBULATORY_CARE_PROVIDER_SITE_OTHER): Payer: Medicare HMO | Admitting: Nurse Practitioner

## 2022-05-13 ENCOUNTER — Encounter (INDEPENDENT_AMBULATORY_CARE_PROVIDER_SITE_OTHER): Payer: Self-pay | Admitting: Nurse Practitioner

## 2022-05-13 VITALS — BP 122/73 | HR 65 | Resp 18 | Ht 66.0 in

## 2022-05-13 DIAGNOSIS — M79604 Pain in right leg: Secondary | ICD-10-CM | POA: Diagnosis not present

## 2022-05-13 DIAGNOSIS — M25461 Effusion, right knee: Secondary | ICD-10-CM

## 2022-05-13 DIAGNOSIS — N186 End stage renal disease: Secondary | ICD-10-CM

## 2022-05-13 DIAGNOSIS — E1165 Type 2 diabetes mellitus with hyperglycemia: Secondary | ICD-10-CM | POA: Diagnosis not present

## 2022-05-13 DIAGNOSIS — M11261 Other chondrocalcinosis, right knee: Secondary | ICD-10-CM

## 2022-05-13 DIAGNOSIS — Z794 Long term (current) use of insulin: Secondary | ICD-10-CM

## 2022-05-13 DIAGNOSIS — E785 Hyperlipidemia, unspecified: Secondary | ICD-10-CM

## 2022-05-13 DIAGNOSIS — M7121 Synovial cyst of popliteal space [Baker], right knee: Secondary | ICD-10-CM | POA: Diagnosis not present

## 2022-05-13 DIAGNOSIS — M1711 Unilateral primary osteoarthritis, right knee: Secondary | ICD-10-CM | POA: Diagnosis not present

## 2022-05-14 ENCOUNTER — Telehealth (INDEPENDENT_AMBULATORY_CARE_PROVIDER_SITE_OTHER): Payer: Self-pay

## 2022-05-14 DIAGNOSIS — N186 End stage renal disease: Secondary | ICD-10-CM | POA: Diagnosis not present

## 2022-05-14 DIAGNOSIS — N2581 Secondary hyperparathyroidism of renal origin: Secondary | ICD-10-CM | POA: Diagnosis not present

## 2022-05-14 DIAGNOSIS — Z992 Dependence on renal dialysis: Secondary | ICD-10-CM | POA: Diagnosis not present

## 2022-05-14 NOTE — Telephone Encounter (Addendum)
I have called the patient and she states she is going to dialysis and will call back to schedule her procedure. Patient called back and is now scheduled with Dr. Delana Meyer on 06/01/22 with a 8:00 am arrival time to the MM. Pre-procedure instructions were discussed and will be mailed.

## 2022-05-17 DIAGNOSIS — N186 End stage renal disease: Secondary | ICD-10-CM | POA: Diagnosis not present

## 2022-05-17 DIAGNOSIS — N2581 Secondary hyperparathyroidism of renal origin: Secondary | ICD-10-CM | POA: Diagnosis not present

## 2022-05-17 DIAGNOSIS — Z992 Dependence on renal dialysis: Secondary | ICD-10-CM | POA: Diagnosis not present

## 2022-05-19 DIAGNOSIS — N186 End stage renal disease: Secondary | ICD-10-CM | POA: Diagnosis not present

## 2022-05-19 DIAGNOSIS — N2581 Secondary hyperparathyroidism of renal origin: Secondary | ICD-10-CM | POA: Diagnosis not present

## 2022-05-19 DIAGNOSIS — Z992 Dependence on renal dialysis: Secondary | ICD-10-CM | POA: Diagnosis not present

## 2022-05-21 DIAGNOSIS — Z992 Dependence on renal dialysis: Secondary | ICD-10-CM | POA: Diagnosis not present

## 2022-05-21 DIAGNOSIS — N2581 Secondary hyperparathyroidism of renal origin: Secondary | ICD-10-CM | POA: Diagnosis not present

## 2022-05-21 DIAGNOSIS — N186 End stage renal disease: Secondary | ICD-10-CM | POA: Diagnosis not present

## 2022-05-22 ENCOUNTER — Encounter (INDEPENDENT_AMBULATORY_CARE_PROVIDER_SITE_OTHER): Payer: Self-pay | Admitting: Nurse Practitioner

## 2022-05-22 NOTE — Progress Notes (Signed)
Subjective:    Patient ID: Terry Arroyo, female    DOB: 23-Jun-1961, 61 y.o.   MRN: [id number] No chief complaint on file.   The patient returns to the office for follow up regarding a problem with their dialysis access.   The patient notes a significant increase in bleeding time after decannulation.  The patient has also been informed that there is increased recirculation.    The patient denies hand pain or other symptoms consistent with steal phenomena.  No significant arm swelling.  The patient denies redness or swelling at the access site. The patient denies fever or chills at home or while on dialysis.  No recent shortening of the patient's walking distance or new symptoms consistent with claudication.  No history of rest pain symptoms. No new ulcers or wounds of the lower extremities have occurred.  The patient denies amaurosis fugax or recent TIA symptoms. There are no recent neurological changes noted. There is no history of DVT, PE or superficial thrombophlebitis. No recent episodes of angina or shortness of breath documented.   Duplex ultrasound of the AV access shows a patent access.  The previously noted stenosis is significantly increased compared to last study.  Flow volume today is 2827 cc/min (previous flow volume was 1661 cc/min)    Review of Systems  Neurological:  Positive for weakness.  All other systems reviewed and are negative.     Objective:   Physical Exam Vitals reviewed.  HENT:     Head: Normocephalic.  Cardiovascular:     Rate and Rhythm: Normal rate.     Pulses:          Radial pulses are 1+ on the right side and 1+ on the left side.  Pulmonary:     Effort: Pulmonary effort is normal.  Skin:    General: Skin is warm and dry.  Neurological:     Mental Status: She is oriented to person, place, and time.     Motor: Weakness present.     Gait: Gait abnormal.  Psychiatric:        Attention and Perception: She is inattentive.        Mood and  Affect: Mood normal. Affect is tearful.        Behavior: Behavior normal.        Thought Content: Thought content normal.        Judgment: Judgment normal.    BP 122/73 (BP Location: Left Arm)   Pulse 65   Resp 18   Ht 5' 6"  (1.676 m)   BMI 28.73 kg/m   Past Medical History:  Diagnosis Date   Anemia    Angina at rest [facility])    Angioedema 03/20/2021   Lisinopril   Anterior cerebral aneurysm    approximately 5 mm; left MCA    Anxiety    Aortic atherosclerosis (HCC)    Asthma    Blind left eye    CHF (congestive heart failure) (HCC)    COPD (chronic obstructive pulmonary disease) (HCC)    Coronary artery disease    Depression    DOE (dyspnea on exertion)    ESRD (end stage renal disease) (HCC)    Gait instability    Genital herpes    GERD (gastroesophageal reflux disease)    HFrEF (heart failure with reduced ejection fraction) ([place])    History of 2019 novel coronavirus disease (COVID-19) 03/2019   [facility] [place]   History of DVT (deep vein thrombosis)  History of DVT (deep vein thrombosis)    Hx of CABG 2018   x 4; performed in [place]   Hyperlipidemia    Hypertension    MI (myocardial infarction) ([place]) 2018   no stents   Pituitary adenoma ([place])    PVD (posterior vitreous detachment), right eye    Secondary hyperparathyroidism ([place])    Sleep apnea    non-compliant with nocturnal PAP therapy   Stroke [facility]) 2014   stated affected left side.   T2DM (type 2 diabetes mellitus) ([place])     Social History   Socioeconomic History   Marital status: Single    Spouse name: Not on file   Number of children: Not on file   Years of education: Not on file   Highest education level: Not on file  Occupational History    Comment: disabled  Tobacco Use   Smoking status: Former    Types: Cigarettes    Quit date: 2018    Years since quitting: 5.4   Smokeless tobacco: Never  Vaping Use   Vaping Use: Never used  Substance and Sexual Activity    Alcohol use: Not Currently   Drug use: Not Currently   Sexual activity: Not Currently  Other Topics Concern   Not on file  Social History Narrative   Patient lives alone.  Feels safe in her home.   Social Determinants of Health   Financial Resource Strain: Not on file  Food Insecurity: Not on file  Transportation Needs: Not on file  Physical Activity: Not on file  Stress: Not on file  Social Connections: Not on file  Intimate Partner Violence: Not on file    Past Surgical History:  Procedure Laterality Date   A/V FISTULAGRAM Right 12/29/2021   Procedure: A/V FISTULAGRAM;  Surgeon: Katha Cabal, MD;  Location: [place] [facility];  Service: Cardiovascular;  Laterality: Right;   AV FISTULA PLACEMENT Right 01/28/2021   Procedure: ARTERIOVENOUS (AV) FISTULA CREATION;  Surgeon: Katha Cabal, MD;  Location: [facility] OR[facility];  Service: Vascular;  Laterality: Right;   AV FISTULA PLACEMENT Right 05/13/2021   Procedure: INSERTION OF ARTERIOVENOUS (AV) GORE-TEX GRAFT ARM ( BRACHIAL AXILLARY );  Surgeon: Katha Cabal, MD;  Location: [facility] OR[facility];  Service: Vascular;  Laterality: Right;   [place]   pituitary tumor   BREAST EXCISIONAL BIOPSY Left yrs ago   benign   CESAREAN SECTION     CORONARY ARTERY BYPASS GRAFT  2018   x 4 vessels; performed in [place] N/A 11/07/2020   Procedure: DIALYSIS/PERMA CATHETER INSERTION;  Surgeon: Algernon Huxley, MD;  Location: [place] [facility];  Service: Cardiovascular;  Laterality: N/A;   DIALYSIS/PERMA CATHETER REMOVAL N/A 06/12/2021   Procedure: DIALYSIS/PERMA CATHETER REMOVAL;  Surgeon: Algernon Huxley, MD;  Location: [place] [facility];  Service: Cardiovascular;  Laterality: N/A;   ESOPHAGOGASTRODUODENOSCOPY (EGD) WITH PROPOFOL N/A 12/22/2021   Procedure: ESOPHAGOGASTRODUODENOSCOPY (EGD) WITH PROPOFOL;  Surgeon: Lesly Rubenstein, MD;  Location: [facility];  Service: Gastroenterology;  Laterality:  N/A;  IDDM   EYE SURGERY Right 2021   cataracts    PERIPHERAL VASCULAR THROMBECTOMY Right 04/03/2021   Procedure: A/V Fistulagram ;  Surgeon: Katha Cabal, MD;  Location: [place] [facility];  Service: Cardiovascular;  Laterality: Right;    Family History  Problem Relation Age of Onset   CAD Mother    CAD Brother    Breast cancer Sister 36  Allergies  Allergen Reactions   Hydralazine Hives, Shortness Of Breath, Swelling and Rash    Body aches    Kiwi Extract Hives, Swelling and Rash    Mouth swells    Lisinopril Swelling    Angioedema   Shellfish Allergy Anaphylaxis    Shrimp/lobster  Betadine leaves welts on skin     Betadine [Povidone Iodine] Hives and Rash    LEAVES WELTS ON SKIN   Septra [Sulfamethoxazole-Trimethoprim]    Metformin Diarrhea and Other (See Comments)    GI Intolerance   Tape Itching    Use paper tape whenever possible       Latest Ref Rng & Units 04/15/2022    2:32 PM 04/15/2022    5:48 AM 04/14/2022    9:08 PM  CBC  WBC 4.0 - 10.5 K/uL  7.7     Hemoglobin 12.0 - 15.0 g/dL 11.1   11.3   11.7    Hematocrit 36.0 - 46.0 % 35.0   36.2   36.5    Platelets 150 - 400 K/uL  195         CMP     Component Value Date/Time   NA 138 05/02/2022 0958   NA 134 (L) 11/18/2013 0138   K 4.3 05/02/2022 0958   K 3.8 11/18/2013 0138   CL 101 05/02/2022 0958   CL 98 11/18/2013 0138   CO2 26 05/02/2022 0958   CO2 29 11/18/2013 0138   GLUCOSE 161 (H) 05/02/2022 0958   GLUCOSE 348 (H) 11/18/2013 0138   BUN 28 (H) 05/02/2022 0958   BUN 11 11/18/2013 0138   CREATININE 5.84 (H) 05/02/2022 0958   CREATININE 0.87 11/18/2013 0138   CALCIUM 8.5 (L) 05/02/2022 0958   CALCIUM 9.4 11/18/2013 0138   PROT 7.9 05/02/2022 0958   ALBUMIN 3.3 (L) 05/02/2022 0958   AST 21 05/02/2022 0958   ALT 15 05/02/2022 0958   ALKPHOS 101 05/02/2022 0958   BILITOT 1.1 05/02/2022 0958   GFRNONAA 8 (L) 05/02/2022 0958   GFRNONAA >60 11/18/2013 0138   GFRAA 18 (L) 09/15/2020  0403   GFRAA >60 11/18/2013 0138     No results found.     Assessment & Plan:   1. ESRD (end stage renal disease) ([place]) Recommend:  The patient is doing well and currently has adequate dialysis access. The patient's dialysis center is not reporting any access issues. Flow pattern is stable when compared to the prior ultrasound.  The patient should have a duplex ultrasound of the dialysis access in 6 months. The patient will follow-up with me in the office after each ultrasound    - VAS [place] (AVF, AVG)  2. Dyslipidemia Continue statin as ordered and reviewed, no changes at this time   3. Uncontrolled type 2 diabetes mellitus with hyperglycemia, with long-term current use of insulin (HCC) Continue hypoglycemic medications as already ordered, these medications have been reviewed and there are no changes at this time.  Hgb A1C to be monitored as already arranged by primary service    Current Outpatient Medications on File Prior to Visit  Medication Sig Dispense Refill   acetaminophen (TYLENOL) 500 MG tablet Take 1,000 mg by mouth every 6 (six) hours as needed for mild pain or moderate pain.     albuterol (VENTOLIN HFA) 108 (90 Base) MCG/ACT inhaler Inhale 2 puffs into the lungs every 6 (six) hours as needed for wheezing or shortness of breath.     amLODipine (NORVASC) 10 MG  tablet Take 1 tablet (10 mg total) by mouth daily. 30 tablet 0   ammonium lactate (LAC-HYDRIN) 12 % lotion Apply 1 application topically every Monday, Wednesday, and Friday.     aspirin 81 MG EC tablet Take 1 tablet (81 mg total) by mouth daily. 30 tablet 0   atorvastatin (LIPITOR) 80 MG tablet Take 1 tablet (80 mg total) by mouth daily at 6 PM. 30 tablet 0   Blood Glucose Monitoring Suppl (TRUE METRIX METER) w/Device KIT      carvedilol (COREG) 25 MG tablet Take 25 mg by mouth 2 (two) times daily.     famotidine (PEPCID) 40 MG tablet Take 1 tablet by mouth daily.     furosemide (LASIX)  80 MG tablet Take 80 mg by mouth 4 (four) times a week. Tues, Thurs, Sat, and Sun (non-dialysis days)     gabapentin (NEURONTIN) 100 MG capsule Take 100 mg by mouth daily as needed (pain).     insulin aspart protamine- aspart (NOVOLOG MIX 70/30) (70-30) 100 UNIT/ML injection Inject 0.15 mLs (15 Units total) into the skin 2 (two) times daily with a meal. (Patient taking differently: Inject 15 Units into the skin 3 (three) times daily with meals.) 10 mL 0   isosorbide mononitrate (IMDUR) 30 MG 24 hr tablet Take 30 mg by mouth daily.     lidocaine (LIDODERM) 5 % Place 1 patch onto the skin every 12 (twelve) hours. Remove & Discard patch within 12 hours or as directed by MD 10 patch 0   lidocaine-prilocaine (EMLA) cream Apply 1 application topically as needed (fistula access).     mirtazapine (REMERON) 30 MG tablet Take 30 mg by mouth at bedtime.     multivitamin (RENA-VIT) TABS tablet Take 1 tablet by mouth every Monday, Wednesday, and Friday.     oxyCODONE-acetaminophen (PERCOCET/ROXICET) 5-325 MG tablet Take 1 tablet by mouth every 6 (six) hours as needed for severe pain. 20 tablet 0   polyethylene glycol (MIRALAX / GLYCOLAX) 17 g packet Take 17 g by mouth daily as needed. 14 each 0   predniSONE (DELTASONE) 50 MG tablet Take 1 tablet (50 mg total) by mouth daily with breakfast. 5 tablet 0   sucralfate (CARAFATE) 1 g tablet Take 1 g by mouth 2 (two) times daily before a meal.     topiramate (TOPAMAX) 25 MG tablet Take 25 mg by mouth daily.     traZODone (DESYREL) 50 MG tablet Take 0.5 tablets (25 mg total) by mouth at bedtime.     TRUE METRIX BLOOD GLUCOSE TEST test strip      vitamin B-12 (CYANOCOBALAMIN) 1000 MCG tablet Take 1,000 mcg by mouth daily.     cephALEXin (KEFLEX) 500 MG capsule Take by mouth.     hyoscyamine (LEVSIN SL) 0.125 MG SL tablet Place 1 tablet under the tongue every 6 (six) hours as needed. (Patient not taking: Reported on 05/13/2022)     No current facility-administered  medications on file prior to visit.    There are no Patient Instructions on file for this visit. No follow-ups on file.   Kris Hartmann, NP

## 2022-05-24 DIAGNOSIS — Z992 Dependence on renal dialysis: Secondary | ICD-10-CM | POA: Diagnosis not present

## 2022-05-24 DIAGNOSIS — N186 End stage renal disease: Secondary | ICD-10-CM | POA: Diagnosis not present

## 2022-05-24 DIAGNOSIS — N2581 Secondary hyperparathyroidism of renal origin: Secondary | ICD-10-CM | POA: Diagnosis not present

## 2022-05-25 ENCOUNTER — Ambulatory Visit
Admission: RE | Admit: 2022-05-25 | Discharge: 2022-05-25 | Disposition: A | Discharge status: Home / Self Care | Payer: Medicare HMO | Source: Ambulatory Visit | Attending: Sports Medicine | Admitting: Sports Medicine

## 2022-05-25 DIAGNOSIS — M79604 Pain in right leg: Secondary | ICD-10-CM | POA: Insufficient documentation

## 2022-05-25 DIAGNOSIS — M11261 Other chondrocalcinosis, right knee: Secondary | ICD-10-CM | POA: Diagnosis not present

## 2022-05-25 DIAGNOSIS — M25461 Effusion, right knee: Secondary | ICD-10-CM | POA: Insufficient documentation

## 2022-05-25 DIAGNOSIS — M7121 Synovial cyst of popliteal space [Baker], right knee: Secondary | ICD-10-CM | POA: Insufficient documentation

## 2022-05-25 DIAGNOSIS — M25561 Pain in right knee: Secondary | ICD-10-CM | POA: Diagnosis not present

## 2022-05-25 IMAGING — MR MR KNEE*R* W/O CM
7 of 9 series · 38 of 40 positions shown · non-contrast
Comparison: Right knee x-rays dated [DATE].

CLINICAL DATA: Chronic posterior right knee pain.

EXAM:
MRI OF THE RIGHT KNEE WITHOUT CONTRAST
TECHNIQUE: Multiplanar, multisequence MR imaging of the knee was performed. No
intravenous contrast was administered.

[Series 3: T2 fat-sat · axial · right · 4.0mm · 0.56mm/px · z∈[-99,+56]mm · 6 of 32 slices shown (1 of 4)]
[im 1/32]
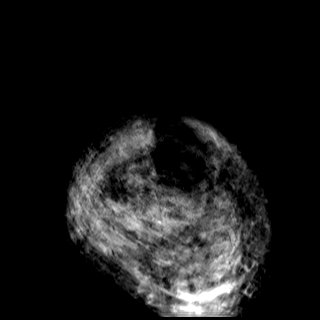
[im 7/32]
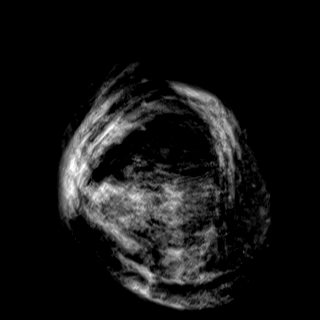
[im 13/32]
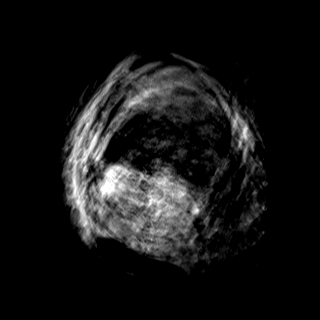
[im 19/32]
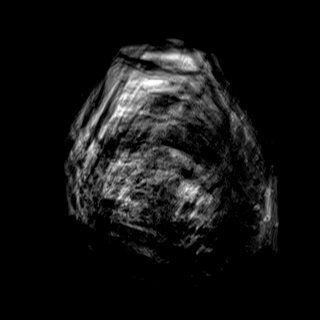
[im 25/32]
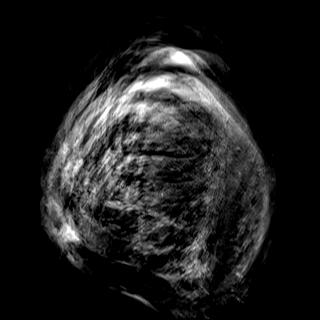
[im 32/32]
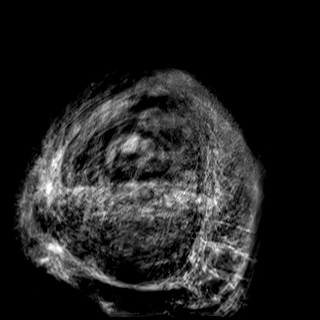

[Series 4: T2 fat-sat · axial · right · 4.0mm · 0.62mm/px · z∈[-99,+56]mm · 6 of 32 slices shown (2 of 4)]
[im 1/32]
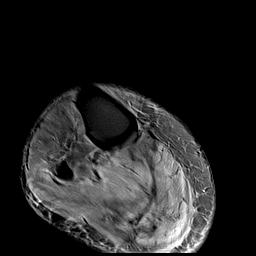
[im 7/32]
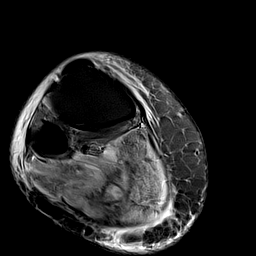
[im 13/32]
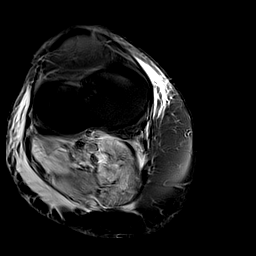
[im 19/32]
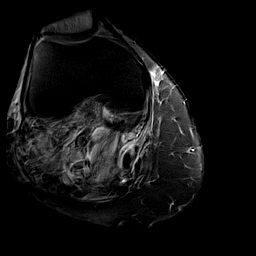
[im 25/32]
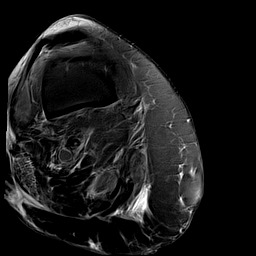
[im 32/32]
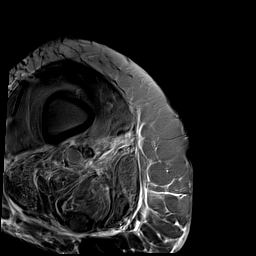

[Series 5: T2 fat-sat · coronal · right · 4.0mm · 0.70mm/px · 5 of 32 slices shown (3 of 4)]
[im 1/32]
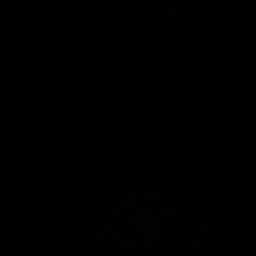
[im 8/32]
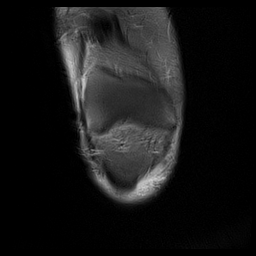
[im 16/32]
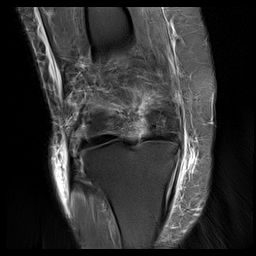
[im 24/32]
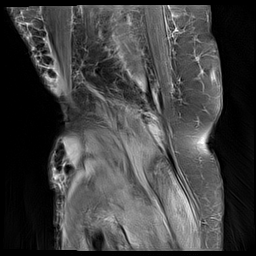
[im 32/32]
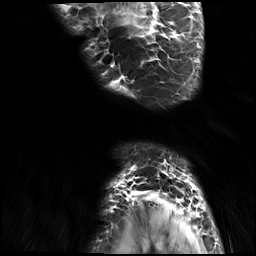

[Series 6: T2 fat-sat · sagittal · right · 4.0mm · 0.70mm/px · 5 of 28 slices shown (4 of 4)]
[im 1/28]
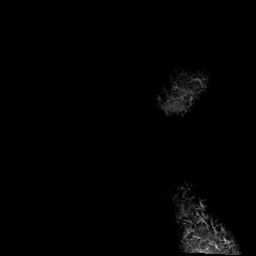
[im 7/28]
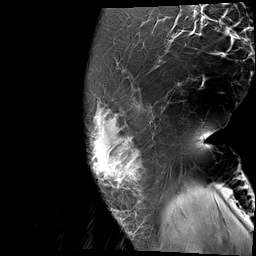
[im 14/28]
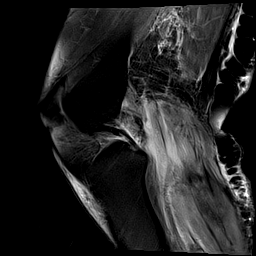
[im 21/28]
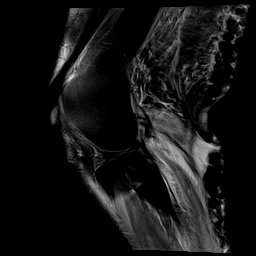
[im 28/28]
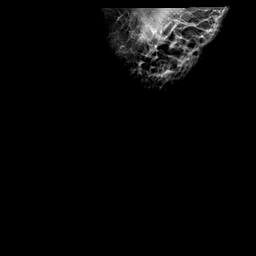

[Series 8: T1 · coronal · right · 4.0mm · 0.70mm/px · 5 of 32 slices shown]
[im 1/32]
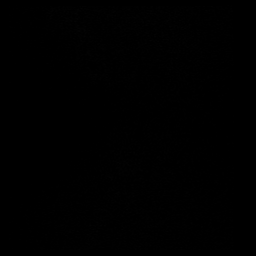
[im 8/32]
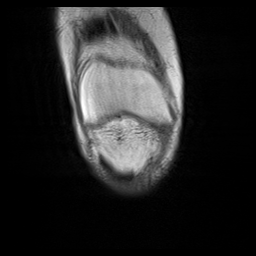
[im 16/32]
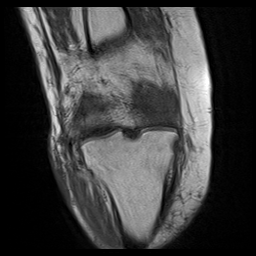
[im 24/32]
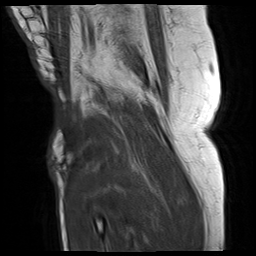
[im 32/32]
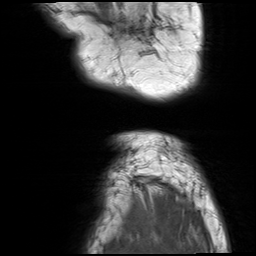

[Series 9: PD fat-sat · coronal · right · 4.0mm · 0.70mm/px · 5 of 32 slices shown (1 of 2)]
[im 1/32]
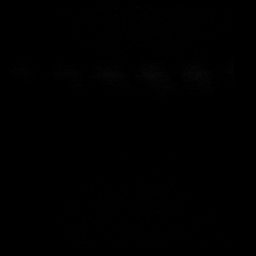
[im 8/32]
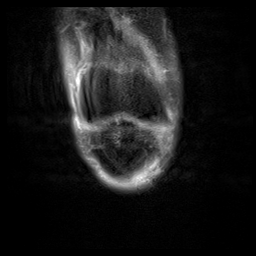
[im 16/32]
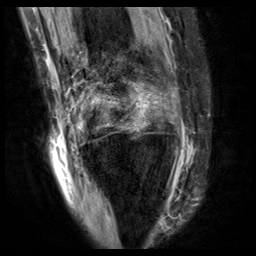
[im 24/32]
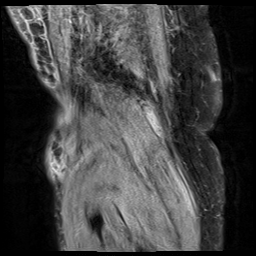
[im 32/32]
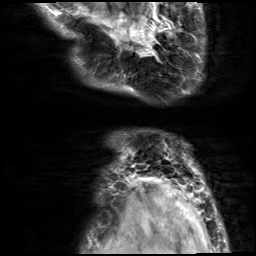

[Series 10: PD fat-sat · sagittal · right · 3.0mm · 0.70mm/px · 6 of 36 slices shown (2 of 2)]
[im 1/36]
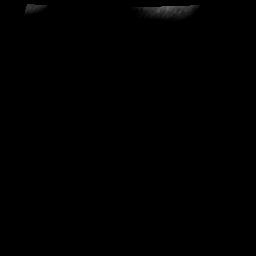
[im 8/36]
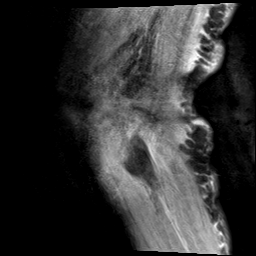
[im 15/36]
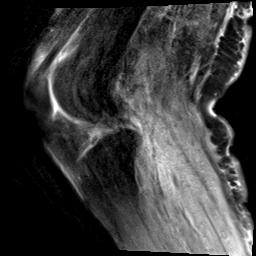
[im 22/36]
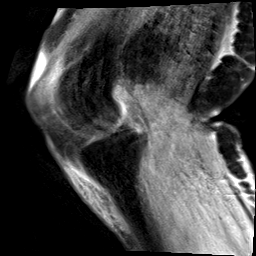
[im 29/36]
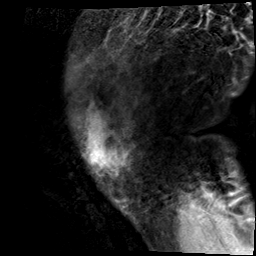
[im 36/36]
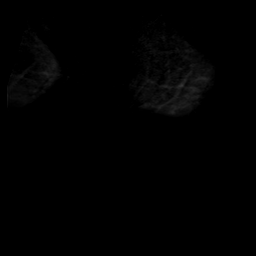

[38 of 40 positions shown; findings below may reference images not displayed]

FINDINGS: Despite efforts by the technologist and patient, motion artifact is
present on today's exam and could not be eliminated. This reduces
exam sensitivity and specificity.

MENISCI

Medial meniscus:  Grossly intact.

Lateral meniscus:  Grossly intact.

LIGAMENTS

Cruciates:  Intact ACL and PCL.

Collaterals: Medial collateral ligament is intact. Lateral
collateral ligament complex is intact.

CARTILAGE

Patellofemoral:  Mild partial-thickness cartilage loss.

Medial: High-grade partial-thickness cartilage loss over the
weight-bearing and posterior nonweightbearing medial femoral
condyle.

Lateral:  Mild partial-thickness cartilage loss.

Joint:  No joint effusion. Normal Hoffa's fat. No plical thickening.

Popliteal Fossa:  Small Baker cyst.  Intact popliteus tendon.

Extensor Mechanism: Intact quadriceps tendon and patellar tendon.
Intact medial and lateral patellar retinaculum. Intact MPFL.

Bones: Serpiginous subchondral signal abnormality in the posterior
nonweightbearing medial femoral condyle with overlying cortical
irregularity, suggestive of chronic avascular necrosis. No acute
fracture or dislocation. No suspicious bone lesion.

Other: Diffuse muscle edema involving all compartments of the
proximal lower leg.
IMPRESSION: 1. Motion limited study. No definite evidence of internal
derangement.
2. Mild tricompartmental osteoarthritis. Chronic avascular necrosis
of the posterior nonweightbearing medial femoral condyle.
3. Small Baker cyst.
4. Diffuse muscle edema involving all compartments of the proximal
lower leg, which can be seen in the setting of denervation.

## 2022-05-26 DIAGNOSIS — Z992 Dependence on renal dialysis: Secondary | ICD-10-CM | POA: Diagnosis not present

## 2022-05-26 DIAGNOSIS — N2581 Secondary hyperparathyroidism of renal origin: Secondary | ICD-10-CM | POA: Diagnosis not present

## 2022-05-26 DIAGNOSIS — N186 End stage renal disease: Secondary | ICD-10-CM | POA: Diagnosis not present

## 2022-05-28 DIAGNOSIS — N186 End stage renal disease: Secondary | ICD-10-CM | POA: Diagnosis not present

## 2022-05-28 DIAGNOSIS — Z992 Dependence on renal dialysis: Secondary | ICD-10-CM | POA: Diagnosis not present

## 2022-05-28 DIAGNOSIS — N2581 Secondary hyperparathyroidism of renal origin: Secondary | ICD-10-CM | POA: Diagnosis not present

## 2022-05-31 DIAGNOSIS — N2581 Secondary hyperparathyroidism of renal origin: Secondary | ICD-10-CM | POA: Diagnosis not present

## 2022-05-31 DIAGNOSIS — Z992 Dependence on renal dialysis: Secondary | ICD-10-CM | POA: Diagnosis not present

## 2022-05-31 DIAGNOSIS — N186 End stage renal disease: Secondary | ICD-10-CM | POA: Diagnosis not present

## 2022-06-01 ENCOUNTER — Emergency Department
Admission: EM | Admit: 2022-06-01 | Discharge: 2022-06-01 | Disposition: A | Discharge status: Home / Self Care | Payer: Medicare HMO | Attending: Emergency Medicine | Admitting: Emergency Medicine

## 2022-06-01 ENCOUNTER — Encounter: Payer: Self-pay | Admitting: Vascular Surgery

## 2022-06-01 ENCOUNTER — Other Ambulatory Visit: Payer: Self-pay

## 2022-06-01 ENCOUNTER — Emergency Department: Payer: Medicare HMO

## 2022-06-01 ENCOUNTER — Encounter
Admission: RE | Disposition: A | Discharge status: Still In Hospital | Payer: Self-pay | Source: Home / Self Care | Attending: Vascular Surgery

## 2022-06-01 ENCOUNTER — Ambulatory Visit
Admission: RE | Admit: 2022-06-01 | Discharge: 2022-06-01 | Disposition: A | Discharge status: Still In Hospital | Payer: Medicare HMO | Source: Home / Self Care | Attending: Vascular Surgery | Admitting: Vascular Surgery

## 2022-06-01 DIAGNOSIS — J45909 Unspecified asthma, uncomplicated: Secondary | ICD-10-CM | POA: Diagnosis not present

## 2022-06-01 DIAGNOSIS — I132 Hypertensive heart and chronic kidney disease with heart failure and with stage 5 chronic kidney disease, or end stage renal disease: Secondary | ICD-10-CM | POA: Diagnosis not present

## 2022-06-01 DIAGNOSIS — R0602 Shortness of breath: Secondary | ICD-10-CM | POA: Insufficient documentation

## 2022-06-01 DIAGNOSIS — D696 Thrombocytopenia, unspecified: Secondary | ICD-10-CM | POA: Diagnosis not present

## 2022-06-01 DIAGNOSIS — M79604 Pain in right leg: Secondary | ICD-10-CM | POA: Insufficient documentation

## 2022-06-01 DIAGNOSIS — Z951 Presence of aortocoronary bypass graft: Secondary | ICD-10-CM | POA: Insufficient documentation

## 2022-06-01 DIAGNOSIS — Z8616 Personal history of COVID-19: Secondary | ICD-10-CM | POA: Diagnosis not present

## 2022-06-01 DIAGNOSIS — E1122 Type 2 diabetes mellitus with diabetic chronic kidney disease: Secondary | ICD-10-CM | POA: Diagnosis not present

## 2022-06-01 DIAGNOSIS — I2510 Atherosclerotic heart disease of native coronary artery without angina pectoris: Secondary | ICD-10-CM | POA: Diagnosis not present

## 2022-06-01 DIAGNOSIS — N186 End stage renal disease: Secondary | ICD-10-CM | POA: Insufficient documentation

## 2022-06-01 DIAGNOSIS — M7121 Synovial cyst of popliteal space [Baker], right knee: Secondary | ICD-10-CM | POA: Diagnosis not present

## 2022-06-01 DIAGNOSIS — Z539 Procedure and treatment not carried out, unspecified reason: Secondary | ICD-10-CM | POA: Insufficient documentation

## 2022-06-01 DIAGNOSIS — I509 Heart failure, unspecified: Secondary | ICD-10-CM | POA: Insufficient documentation

## 2022-06-01 DIAGNOSIS — R600 Localized edema: Secondary | ICD-10-CM | POA: Diagnosis not present

## 2022-06-01 DIAGNOSIS — Z992 Dependence on renal dialysis: Secondary | ICD-10-CM | POA: Insufficient documentation

## 2022-06-01 DIAGNOSIS — M7989 Other specified soft tissue disorders: Secondary | ICD-10-CM | POA: Diagnosis not present

## 2022-06-01 DIAGNOSIS — J449 Chronic obstructive pulmonary disease, unspecified: Secondary | ICD-10-CM | POA: Diagnosis not present

## 2022-06-01 LAB — BASIC METABOLIC PANEL
Anion gap: 12 (ref 5–15)
BUN: 22 mg/dL — ABNORMAL HIGH (ref 6–20)
CO2: 24 mmol/L (ref 22–32)
Calcium: 8.5 mg/dL — ABNORMAL LOW (ref 8.9–10.3)
Chloride: 105 mmol/L (ref 98–111)
Creatinine, Ser: 4.53 mg/dL — ABNORMAL HIGH (ref 0.44–1.00)
GFR, Estimated: 11 mL/min — ABNORMAL LOW (ref >60)
Glucose, Bld: 159 mg/dL — ABNORMAL HIGH (ref 70–99)
Potassium: 4.7 mmol/L (ref 3.5–5.1)
Sodium: 141 mmol/L (ref 135–145)

## 2022-06-01 LAB — CBC
HCT: 37 % (ref 36.0–46.0)
Hemoglobin: 10.9 g/dL — ABNORMAL LOW (ref 12.0–15.0)
MCH: 27 pg (ref 26.0–34.0)
MCHC: 29.5 g/dL — ABNORMAL LOW (ref 30.0–36.0)
MCV: 91.8 fL (ref 80.0–100.0)
Platelets: 149 10*3/uL — ABNORMAL LOW (ref 150–400)
RBC: 4.03 MIL/uL (ref 3.87–5.11)
RDW: 18.6 % — ABNORMAL HIGH (ref 11.5–15.5)
WBC: 5.4 10*3/uL (ref 4.0–10.5)
nRBC: 0 % (ref 0.0–0.2)

## 2022-06-01 LAB — LACTIC ACID, PLASMA: Lactic Acid, Venous: 1.2 mmol/L (ref 0.5–1.9)

## 2022-06-01 LAB — PROCALCITONIN: Procalcitonin: 0.13 ng/mL

## 2022-06-01 LAB — GLUCOSE, CAPILLARY: Glucose-Capillary: 161 mg/dL — ABNORMAL HIGH (ref 70–99)

## 2022-06-01 LAB — POTASSIUM (ARMC VASCULAR LAB ONLY): Potassium (ARMC vascular lab): 4.8 mmol/L (ref 3.5–5.1)

## 2022-06-01 IMAGING — US US EXTREM LOW VENOUS*R*
1 series · 13 of 24 positions shown · non-contrast
Comparison: None Available.

CLINICAL DATA: Pain and edema for 3 months



[Series 1: venous · portal-venous · 13 of 39 slices shown]
[im 1/39]
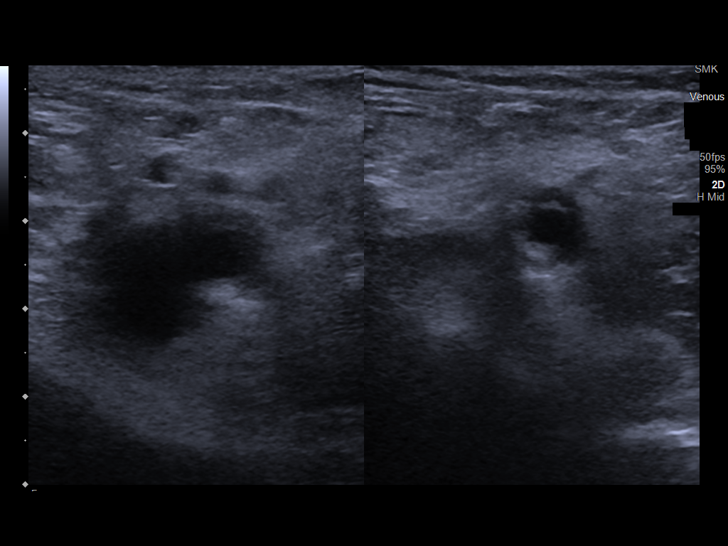
[im 4/39]
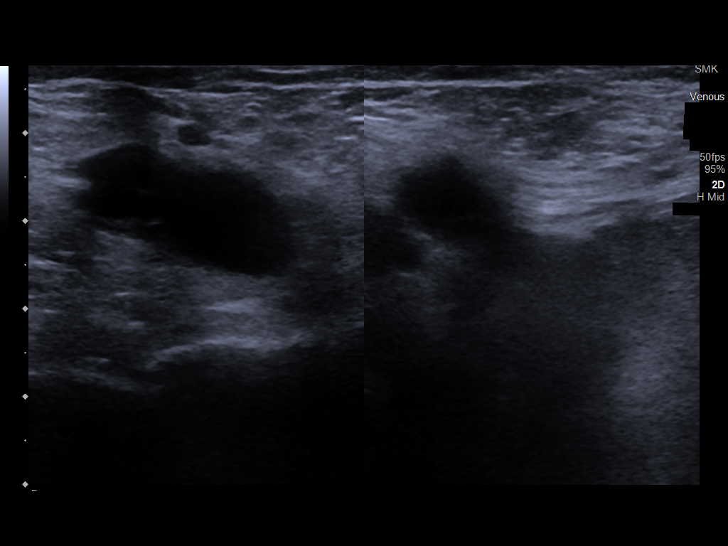
[im 7/39]
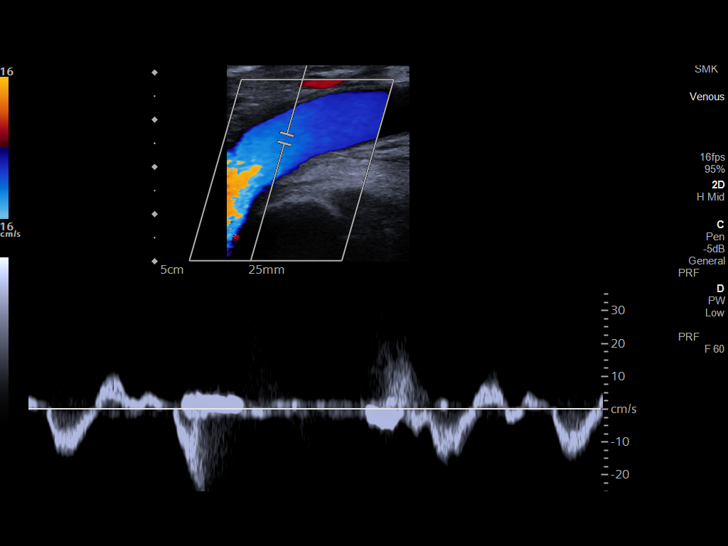
[im 10/39]
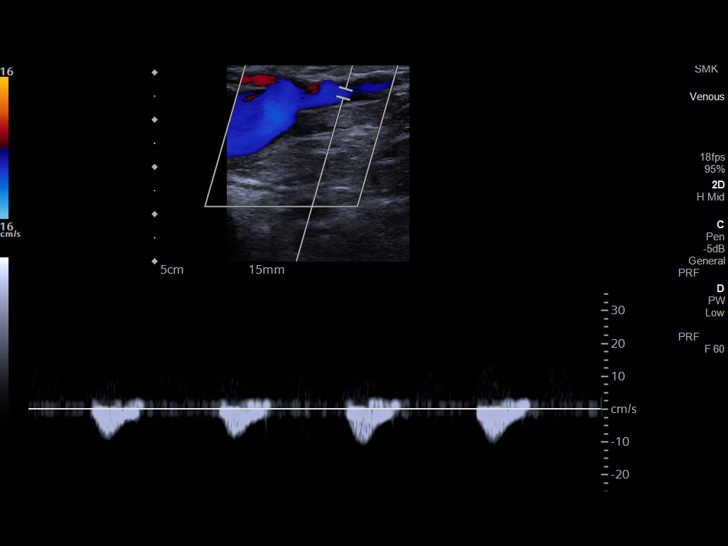
[im 14/39]
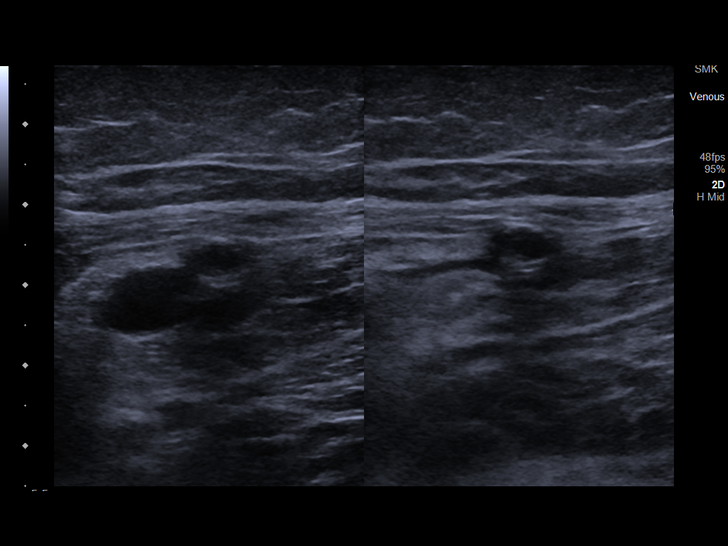
[im 17/39]
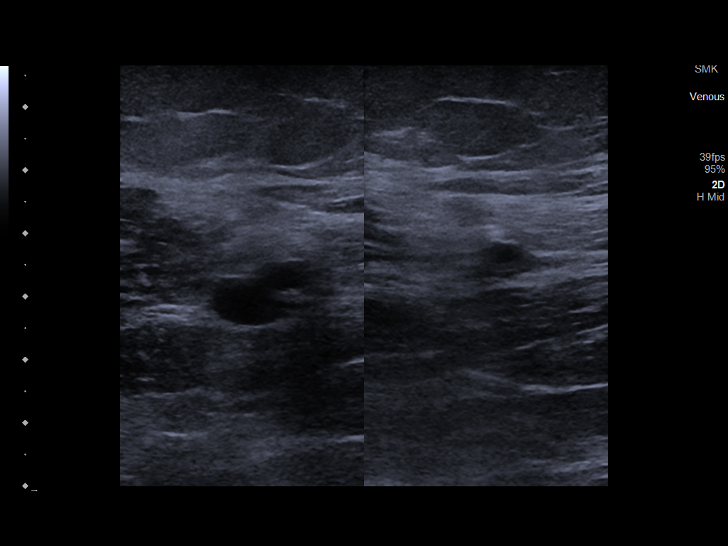
[im 20/39]
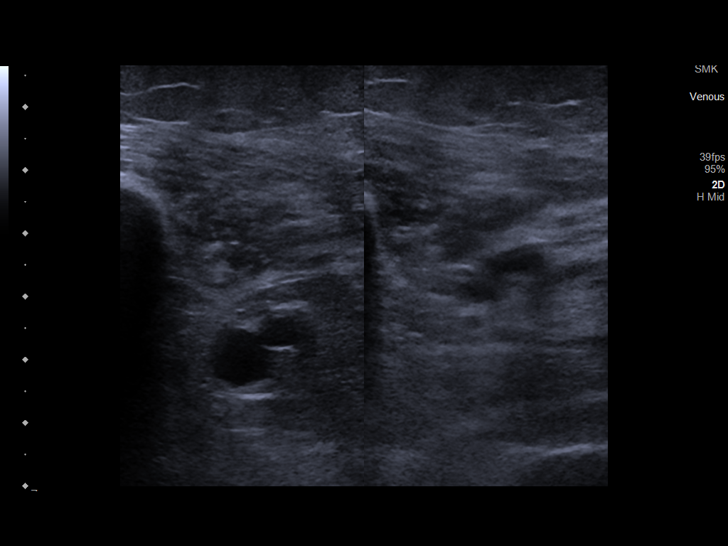
[im 22/39]
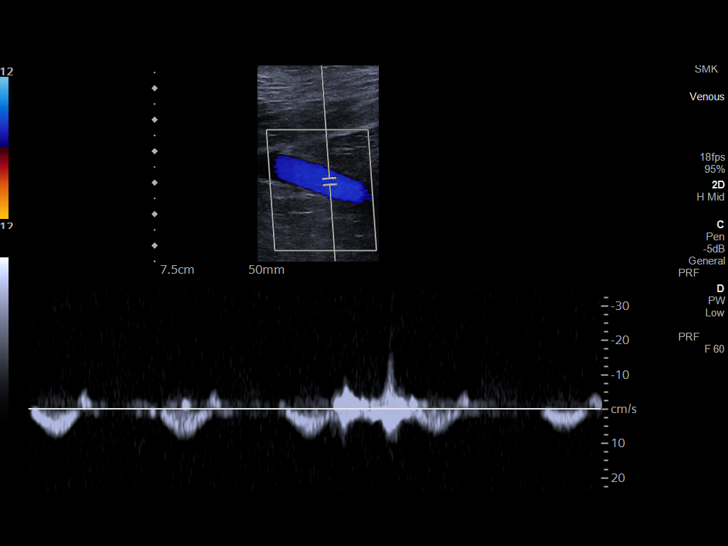
[im 25/39]
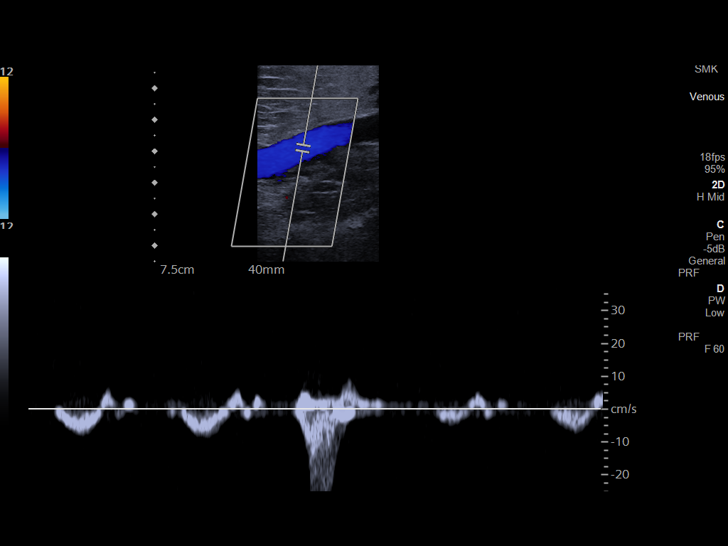
[im 29/39]
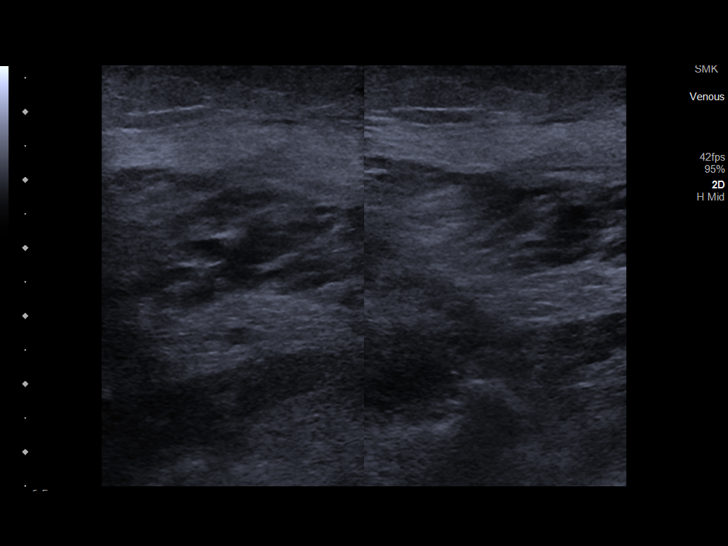
[im 32/39]
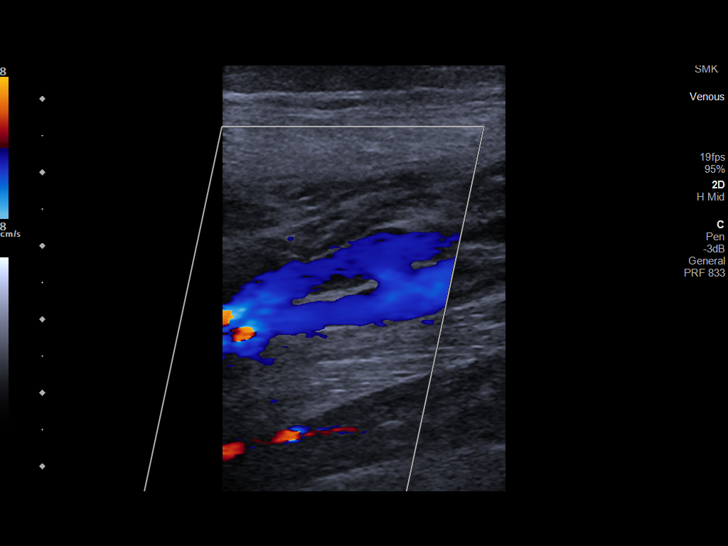
[im 35/39]
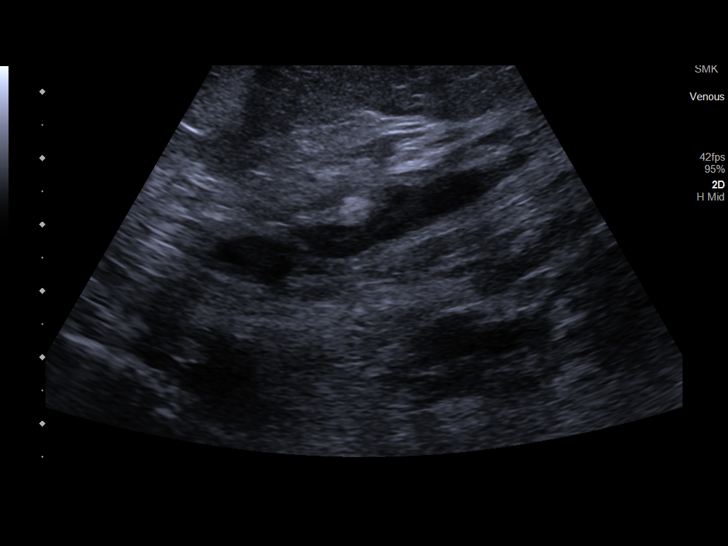
[im 39/39]
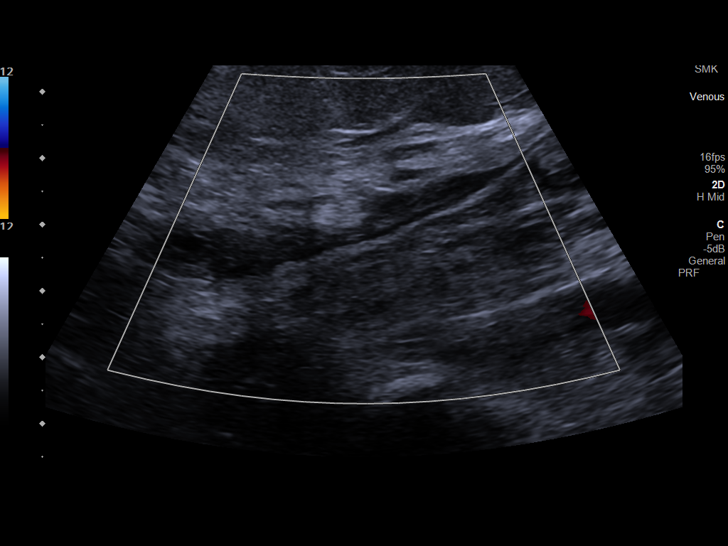

[13 of 24 positions shown; findings below may reference images not displayed]

FINDINGS: Contralateral Common Femoral Vein: Respiratory phasicity is normal
and symmetric with the symptomatic side. No evidence of thrombus.
Normal compressibility.

Common Femoral Vein: No evidence of thrombus. Normal
compressibility, respiratory phasicity and response to augmentation.

Saphenofemoral Junction: No evidence of thrombus. Normal
compressibility and flow on color Doppler imaging.

Profunda Femoral Vein: No evidence of thrombus. Normal
compressibility and flow on color Doppler imaging.

Femoral Vein: No evidence of thrombus. Normal compressibility,
respiratory phasicity and response to augmentation.

Popliteal Vein: No evidence of thrombus. Normal compressibility,
respiratory phasicity and response to augmentation.

Calf Veins: No evidence of thrombus. Normal compressibility and flow
on color Doppler imaging.

Other Findings: Thin elongated nondistended Baker's cyst measures
5.0 x 1.0 x 6.4 cm.
IMPRESSION: No evidence of deep venous thrombosis.

## 2022-06-01 IMAGING — DX DG TIBIA/FIBULA 2V*R*
4 series · 4 of 4 positions shown · non-contrast
Comparison: None Available.

CLINICAL DATA: Leg pain and swelling for 3 months, history of DVT

EXAM:
RIGHT TIBIA AND FIBULA - 2 VIEW

[tibia ap (1 of 2)]
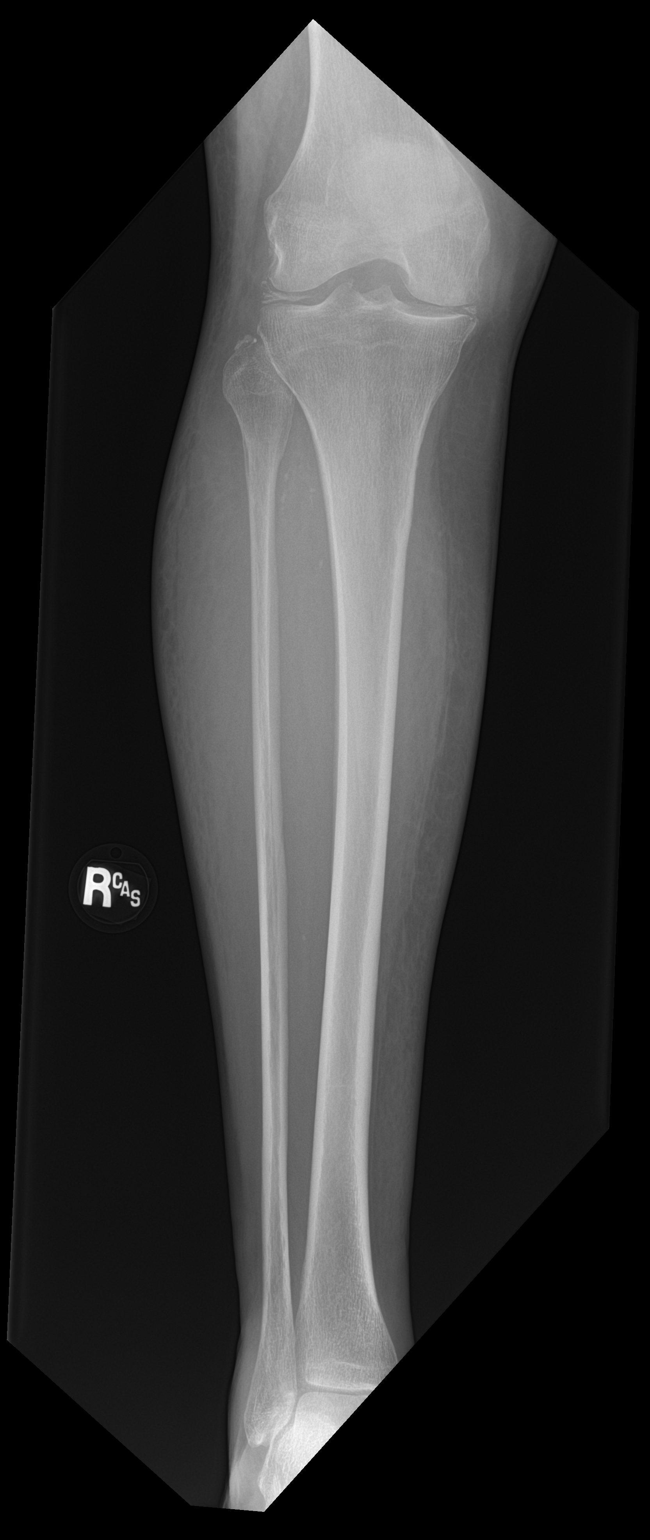

[tibia ap (2 of 2)]
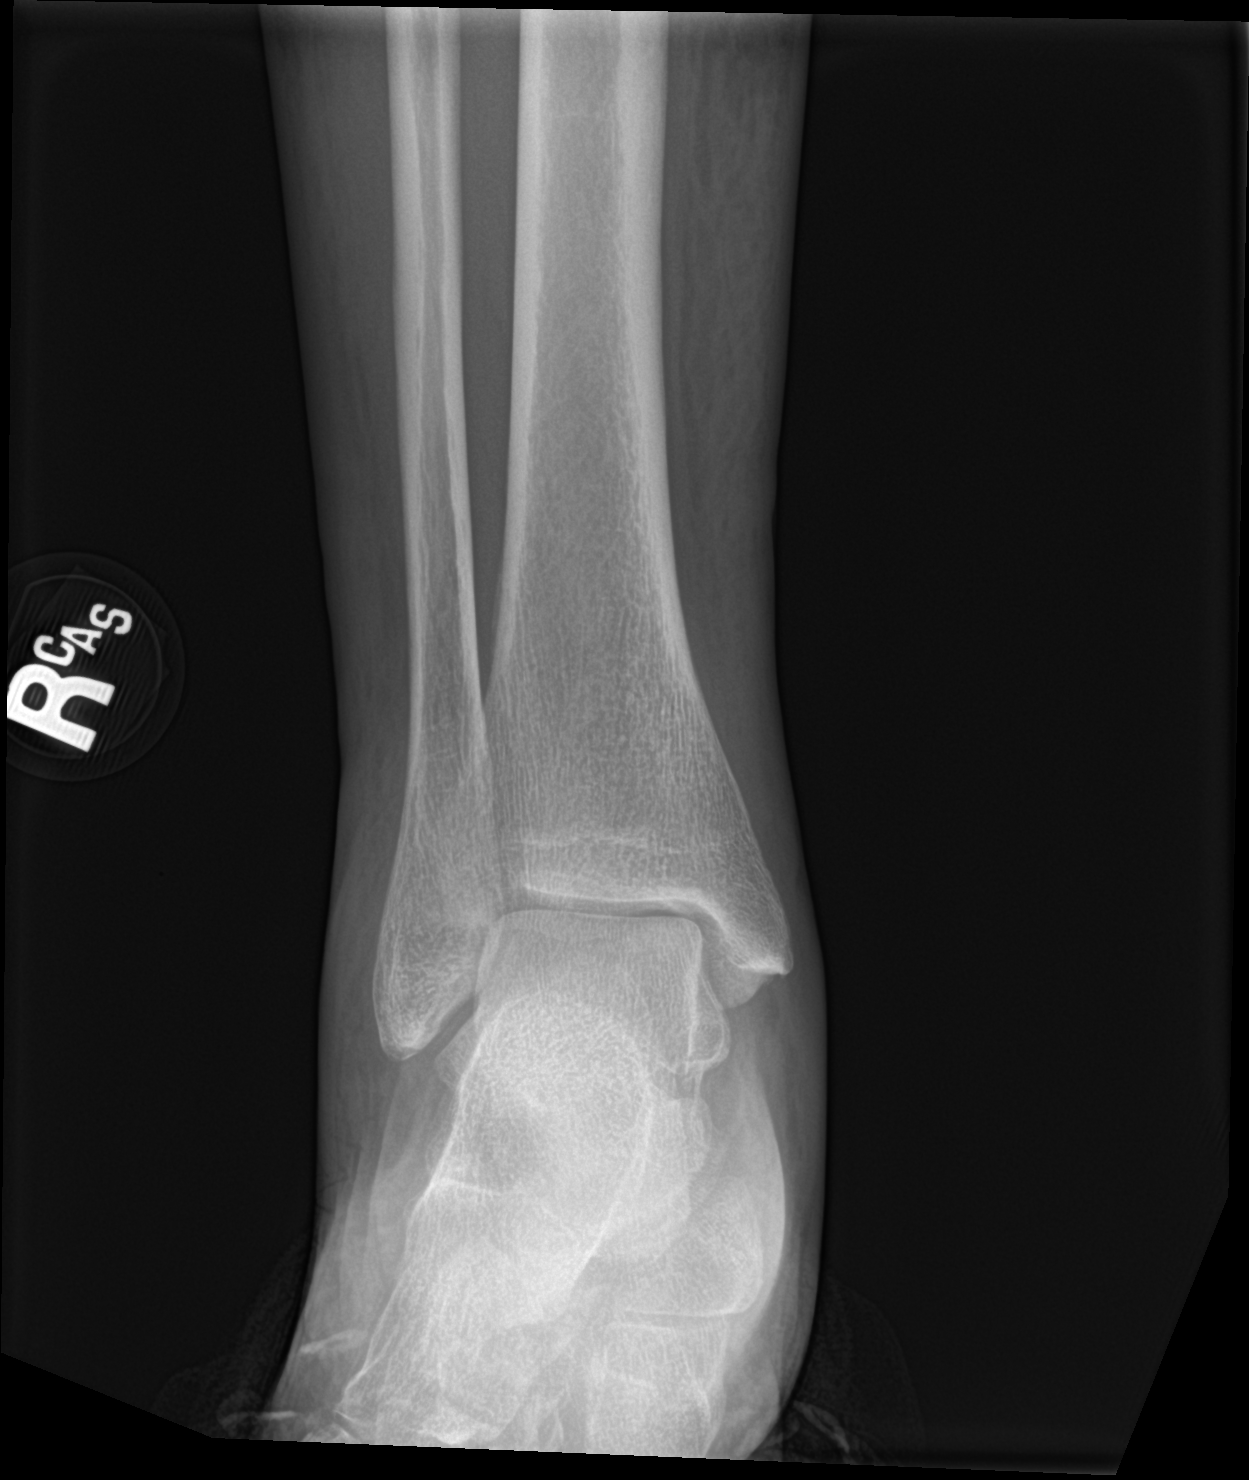

[tibia lat (1 of 2)]
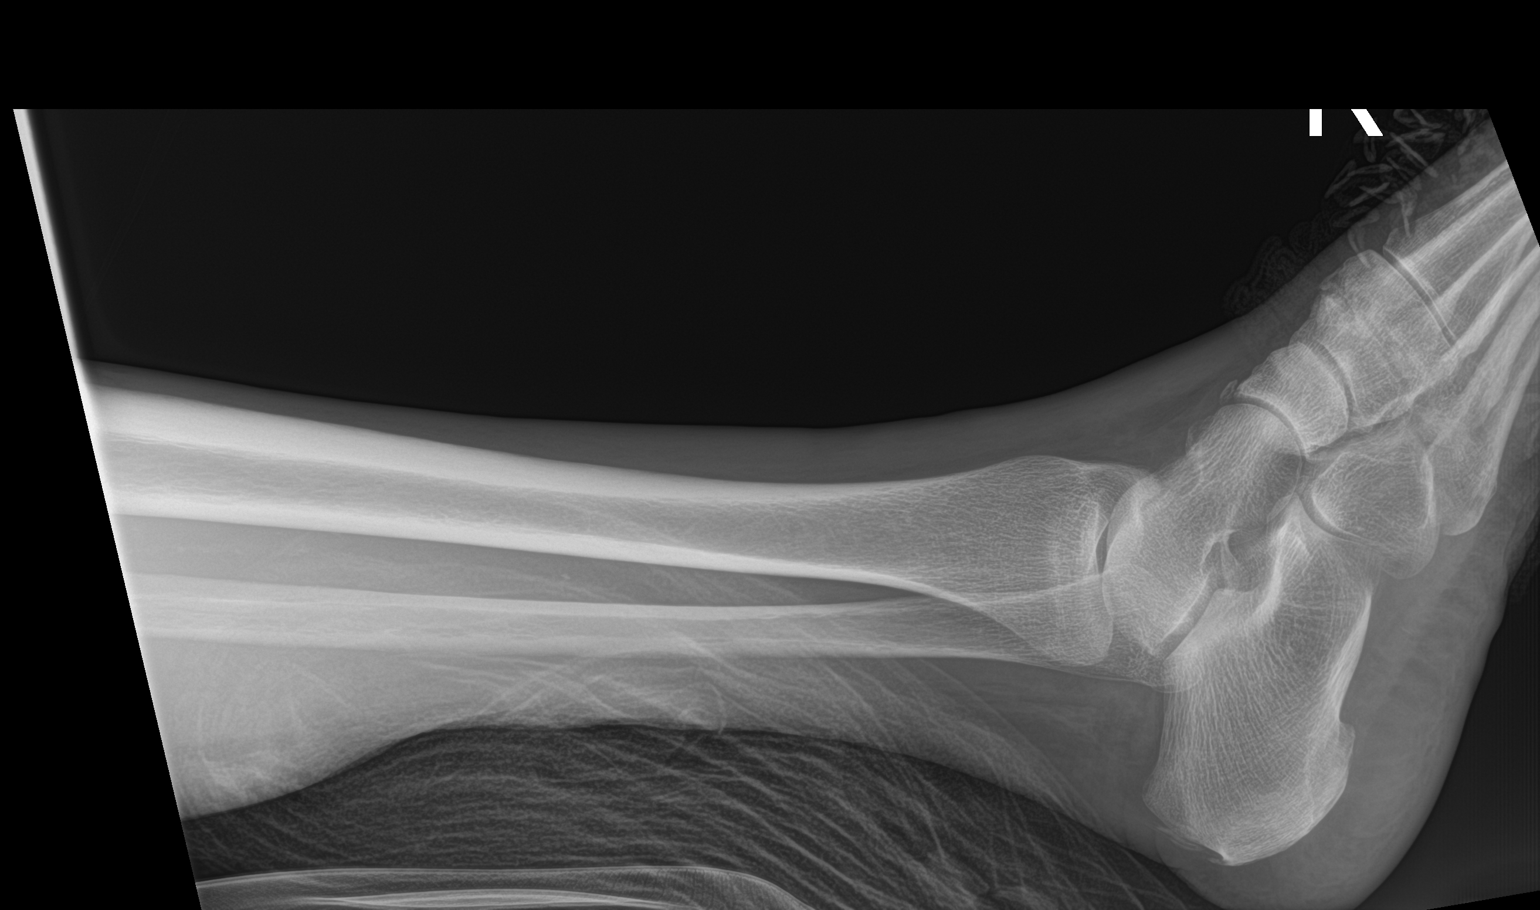

[tibia lat (2 of 2)]
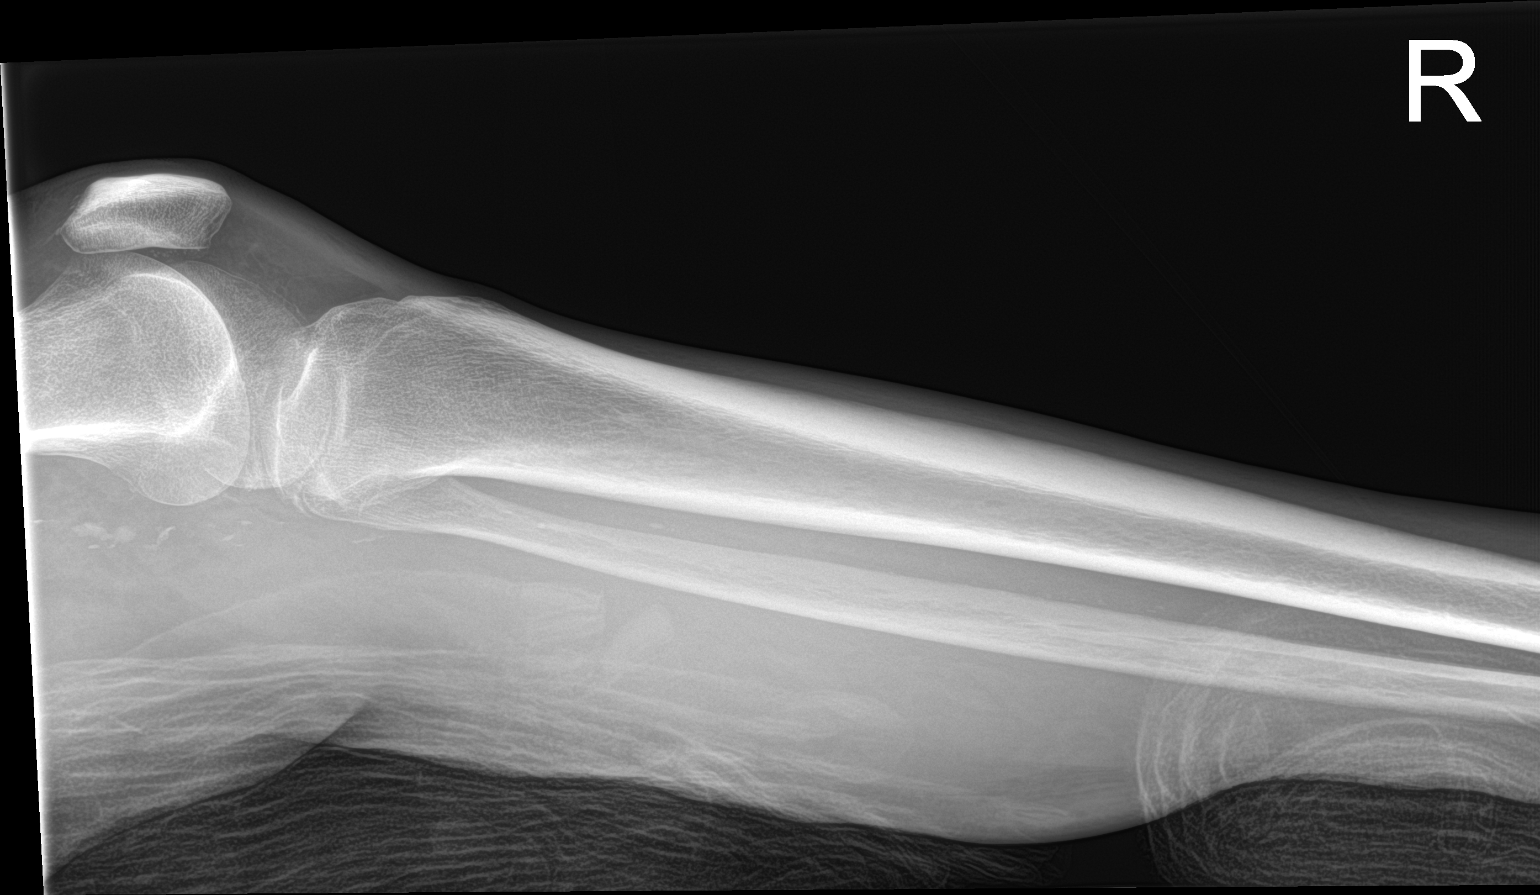

[4 of 4 positions shown; findings below may reference images not displayed]

FINDINGS: No fracture or dislocation of the right tibia or fibula. Soft tissue
edema about the lower leg and ankle.
IMPRESSION: No fracture or dislocation of the right tibia or fibula. Soft tissue
edema about the lower leg and ankle.

## 2022-06-01 SURGERY — A/V FISTULAGRAM
Anesthesia: Moderate Sedation | Site: Arm Upper | Laterality: Right

## 2022-06-01 MED ORDER — MIDAZOLAM HCL 2 MG/2ML IJ SOLN
INTRAMUSCULAR | Status: AC
  Filled 2022-06-01: qty 4

## 2022-06-01 MED ORDER — FENTANYL CITRATE PF 50 MCG/ML IJ SOSY
PREFILLED_SYRINGE | INTRAMUSCULAR | Status: AC
  Filled 2022-06-01: qty 2

## 2022-06-01 MED ORDER — METHYLPREDNISOLONE SODIUM SUCC 125 MG IJ SOLR: 125.0000 mg | Freq: Once | INTRAMUSCULAR | Status: DC | PRN

## 2022-06-01 MED ORDER — CEFAZOLIN SODIUM-DEXTROSE 1-4 GM/50ML-% IV SOLN
INTRAVENOUS | Status: AC
  Filled 2022-06-01: qty 50

## 2022-06-01 MED ORDER — CEPHALEXIN 500 MG PO CAPS: 500.0000 mg | ORAL_CAPSULE | Freq: Four times a day (QID) | ORAL | 0 refills | Status: AC

## 2022-06-01 MED ORDER — HEPARIN SODIUM (PORCINE) 1000 UNIT/ML IJ SOLN
INTRAMUSCULAR | Status: AC
  Filled 2022-06-01: qty 10

## 2022-06-01 MED ORDER — CEFAZOLIN SODIUM-DEXTROSE 1-4 GM/50ML-% IV SOLN: 1.0000 g | INTRAVENOUS | Status: DC

## 2022-06-01 MED ORDER — ACETAMINOPHEN 500 MG PO TABS
1000.0000 mg | ORAL_TABLET | Freq: Once | ORAL | Status: AC
  Administered 2022-06-01: 1000 mg via ORAL
  Filled 2022-06-01: qty 2

## 2022-06-01 MED ORDER — HYDROMORPHONE HCL 1 MG/ML IJ SOLN: 1.0000 mg | Freq: Once | INTRAMUSCULAR | Status: DC | PRN

## 2022-06-01 MED ORDER — MIDAZOLAM HCL 2 MG/ML PO SYRP: 8.0000 mg | ORAL_SOLUTION | Freq: Once | ORAL | Status: DC | PRN

## 2022-06-01 MED ORDER — ONDANSETRON HCL 4 MG/2ML IJ SOLN
4.0000 mg | Freq: Four times a day (QID) | INTRAMUSCULAR | Status: DC | PRN
Start: 2022-06-01 — End: 2022-06-01

## 2022-06-01 MED ORDER — DIPHENHYDRAMINE HCL 50 MG/ML IJ SOLN: 50.0000 mg | Freq: Once | INTRAMUSCULAR | Status: DC | PRN

## 2022-06-01 MED ORDER — FAMOTIDINE 20 MG PO TABS: 40.0000 mg | ORAL_TABLET | Freq: Once | ORAL | Status: DC | PRN

## 2022-06-01 MED ORDER — SODIUM CHLORIDE 0.9 % IV SOLN: INTRAVENOUS | Status: DC

## 2022-06-01 NOTE — ED Provider Notes (Signed)
[facility] Provider Note    Event Date/Time   First MD Initiated Contact with Patient 06/01/22 [phone]     (approximate)   History   Leg Pain   HPI  Terry Arroyo is a 61 y.o. female with past medical history of anemia, CHF, COPD, CAD, ESRD, GERD, DVT, HTN, HDL, PVD, DM and blindness in the left eye who presents after being referred from preop where she was scheduled undergo an outpatient angiography of her dialysis fistula for further evaluation of right leg pain.  Patient reports this has been going on for at least 1 to 2 months but may be slightly worse over the last couple days.  She does not recall any injuries or falls.  She denies any other acute pain and is not currently on antibiotics although states she has been in the past.  She endorses some chronic shortness of breath on exertion is not significant different today but no fevers, cough, abdominal pain, back pain or any other acute extremity pain.  She does not recall any injuries or rashes.  She is not sure when she was last on the blood thinner.  She does note she had a cyst in the back of the right knee.  She typically goes to dialysis Monday Wednesday Friday and has not missed any recent sessions.    Past Medical History:  Diagnosis Date   Anemia    Angina at rest [facility])    Angioedema 03/20/2021   Lisinopril   Anterior cerebral aneurysm    approximately 5 mm; left MCA    Anxiety    Aortic atherosclerosis (HCC)    Asthma    Blind left eye    CHF (congestive heart failure) (HCC)    COPD (chronic obstructive pulmonary disease) (HCC)    Coronary artery disease    Depression    DOE (dyspnea on exertion)    ESRD (end stage renal disease) (HCC)    Gait instability    Genital herpes    GERD (gastroesophageal reflux disease)    HFrEF (heart failure with reduced ejection fraction) ([place])    History of 2019 novel coronavirus disease (COVID-19) 03/2019   [place] [facility] [place]    History of DVT (deep vein thrombosis)    History of DVT (deep vein thrombosis)    Hx of CABG 2018   x 4; performed in [place]   Hyperlipidemia    Hypertension    MI (myocardial infarction) ([place]) 2018   no stents   Pituitary adenoma ([place])    PVD (posterior vitreous detachment), right eye    Secondary hyperparathyroidism ([place])    Sleep apnea    non-compliant with nocturnal PAP therapy   Stroke [facility]) 2014   stated affected left side.   T2DM (type 2 diabetes mellitus) [facility])      Physical Exam  Triage Vital Signs: ED Triage Vitals  Enc Vitals Group     BP 06/01/22 0928 (!) 170/79     Pulse Rate 06/01/22 0928 66     Resp 06/01/22 0928 18     Temp 06/01/22 0928 97.9 F (36.6 C)     Temp src --      SpO2 06/01/22 0928 97 %     Weight --      Height --      Head Circumference --      Peak Flow --      Pain Score 06/01/22 0924 7  Pain Loc --      Pain Edu? --      Excl. in [place]? --     Most recent vital signs: Vitals:   06/01/22 1030 06/01/22 1100  BP: (!) 165/79 (!) 143/82  Pulse: 62 60  Resp:  17  Temp:    SpO2: 100% 92%    General: Awake, no distress.  CV:  Good peripheral perfusion.  2+ radial pulses.  1+ DP pulses. Resp:  Normal effort.  Abd:  No distention.  Other:  There are some tenderness and induration in the posterior aspect of the right calf.  Diffuse with some mild warmth.  No significant edema compared to the left.  Patient is able to move bilateral lower extremities with symmetric strength.  Sensation is intact to light touch throughout.   ED Results / Procedures / Treatments  Labs (all labs ordered are listed, but only abnormal results are displayed) Labs Reviewed  CBC - Abnormal; Notable for the following components:      Result Value   Hemoglobin 10.9 (*)    MCHC 29.5 (*)    RDW 18.6 (*)    Platelets 149 (*)    All other components within normal limits  BASIC METABOLIC PANEL - Abnormal; Notable for the following components:   Glucose, Bld  159 (*)    BUN 22 (*)    Creatinine, Ser 4.53 (*)    Calcium 8.5 (*)    GFR, Estimated 11 (*)    All other components within normal limits  LACTIC ACID, PLASMA  PROCALCITONIN     EKG    RADIOLOGY  X-ray of the right tib-fib area shows no underlying bony abnormalities or demyelinization fracture or subcu gas.  There is some edema.  Also reviewed radiology's interpretation.  Right lower extremity ultrasound on my interpretation without evidence of a DVT or other acute process.  I also reviewed radiology's interpretation.  PROCEDURES:  Critical Care performed: No  Procedures    MEDICATIONS ORDERED IN ED: Medications  acetaminophen (TYLENOL) tablet 1,000 mg (1,000 mg Oral Given 06/01/22 1147)     IMPRESSION / MDM / ASSESSMENT AND PLAN / ED COURSE  I reviewed the triage vital signs and the nursing notes. Patient's presentation is most consistent with acute presentation with potential threat to life or bodily function.                               Differential diagnosis includes, but is not limited to, DVT versus cellulitis versus ruptured Baker's cyst.  No history exam features to suggest trauma and have a lower suspicion at this time for sepsis or necrotizing infection.  Lactic is 1.2.  CBC shows no leukocytosis and stable anemia with hemoglobin of 10.9 compared to 11.3-monthago.  Platelets are 149.  BMP shows kidney function consistent with patient's history of ESRD without any other significant emergent derangements.  X-ray of the right tib-fib area shows no underlying bony abnormalities or demyelinization fracture or subcu gas.  There is some edema.  Also reviewed radiology's interpretation.  Right lower extremity ultrasound on my interpretation without evidence of a DVT or other acute process.  I also reviewed radiology's interpretation.  I suspect likely recurrent cellulitis.  Patient reports she had something similar in the left leg last month.  She does not seem  septic and there is no purulence.  I think it is reasonable for trial course of Keflex again as  seem to work well in the left leg last month.  She has no other acute concerns and I will suspicion for other immediate life-threatening process.  Discharged in stable condition.  Strict return precautions advised and discussed.      FINAL CLINICAL IMPRESSION(S) / ED DIAGNOSES   Final diagnoses:  Right leg pain     Rx / DC Orders   ED Discharge Orders          Ordered    cephALEXin (KEFLEX) 500 MG capsule  4 times daily        06/01/22 1146             Note:  This document was prepared using Dragon voice recognition software and may include unintentional dictation errors.   Lucrezia Starch, MD 06/01/22 1148

## 2022-06-01 NOTE — Progress Notes (Addendum)
Pt. C/o severe right lower leg pain "6" on scale 1-10. Leg swollen, tender to touch. Pt. States "it stings in the back of the muscle." "It feels hot." "They did an MRI on the 3rd and they said it's arthritis." Dr. Delana Meyer made aware . Dr. Delana Meyer at bedside now looking at pt. Right LE. MD suggests pt. Goes to ER now. MD called ER & ER states they can see her.

## 2022-06-01 NOTE — ED Triage Notes (Signed)
Pt comes with c/o right leg pain and swelling. Pt states this has been going on for 3 months. Pt states hx of DVT. Pt was on Keflex for it but pt states it seems to have made it worse.  Pt was brought over from [facility] procedures. MD concerned it could be cellulitis. IV in place at this time.

## 2022-06-01 NOTE — Progress Notes (Signed)
IVF's Dc'd. Pt. Taken to ER via w/c escort with RN. Stable for Tx to ER.

## 2022-06-02 DIAGNOSIS — N2581 Secondary hyperparathyroidism of renal origin: Secondary | ICD-10-CM | POA: Diagnosis not present

## 2022-06-02 DIAGNOSIS — Z992 Dependence on renal dialysis: Secondary | ICD-10-CM | POA: Diagnosis not present

## 2022-06-02 DIAGNOSIS — N186 End stage renal disease: Secondary | ICD-10-CM | POA: Diagnosis not present

## 2022-06-04 DIAGNOSIS — N2581 Secondary hyperparathyroidism of renal origin: Secondary | ICD-10-CM | POA: Diagnosis not present

## 2022-06-04 DIAGNOSIS — N186 End stage renal disease: Secondary | ICD-10-CM | POA: Diagnosis not present

## 2022-06-04 DIAGNOSIS — Z992 Dependence on renal dialysis: Secondary | ICD-10-CM | POA: Diagnosis not present

## 2022-06-07 DIAGNOSIS — N186 End stage renal disease: Secondary | ICD-10-CM | POA: Diagnosis not present

## 2022-06-07 DIAGNOSIS — Z992 Dependence on renal dialysis: Secondary | ICD-10-CM | POA: Diagnosis not present

## 2022-06-07 DIAGNOSIS — N2581 Secondary hyperparathyroidism of renal origin: Secondary | ICD-10-CM | POA: Diagnosis not present

## 2022-06-09 ENCOUNTER — Telehealth (INDEPENDENT_AMBULATORY_CARE_PROVIDER_SITE_OTHER): Payer: Self-pay

## 2022-06-09 DIAGNOSIS — Z992 Dependence on renal dialysis: Secondary | ICD-10-CM | POA: Diagnosis not present

## 2022-06-09 DIAGNOSIS — N186 End stage renal disease: Secondary | ICD-10-CM | POA: Diagnosis not present

## 2022-06-09 DIAGNOSIS — N2581 Secondary hyperparathyroidism of renal origin: Secondary | ICD-10-CM | POA: Diagnosis not present

## 2022-06-09 NOTE — Telephone Encounter (Signed)
Spoke with the patient and she is scheduled with Dr. Delana Meyer for a RUE fistulagram on 06/15/22 with a 6:45 am arrival time to the MM. Pre-procedure instructions were discussed and will be mailed.

## 2022-06-11 DIAGNOSIS — Z992 Dependence on renal dialysis: Secondary | ICD-10-CM | POA: Diagnosis not present

## 2022-06-11 DIAGNOSIS — N186 End stage renal disease: Secondary | ICD-10-CM | POA: Diagnosis not present

## 2022-06-11 DIAGNOSIS — N2581 Secondary hyperparathyroidism of renal origin: Secondary | ICD-10-CM | POA: Diagnosis not present

## 2022-06-14 DIAGNOSIS — Z992 Dependence on renal dialysis: Secondary | ICD-10-CM | POA: Diagnosis not present

## 2022-06-14 DIAGNOSIS — N186 End stage renal disease: Secondary | ICD-10-CM | POA: Diagnosis not present

## 2022-06-14 DIAGNOSIS — N2581 Secondary hyperparathyroidism of renal origin: Secondary | ICD-10-CM | POA: Diagnosis not present

## 2022-06-15 ENCOUNTER — Encounter: Payer: Self-pay | Admitting: Vascular Surgery

## 2022-06-15 ENCOUNTER — Other Ambulatory Visit: Payer: Self-pay

## 2022-06-15 ENCOUNTER — Encounter
Admission: RE | Disposition: A | Discharge status: Home / Self Care | Payer: Self-pay | Source: Home / Self Care | Attending: Vascular Surgery

## 2022-06-15 ENCOUNTER — Ambulatory Visit
Admission: RE | Admit: 2022-06-15 | Discharge: 2022-06-15 | Disposition: A | Discharge status: Home / Self Care | Payer: Medicare HMO | Attending: Vascular Surgery | Admitting: Vascular Surgery

## 2022-06-15 DIAGNOSIS — Z87891 Personal history of nicotine dependence: Secondary | ICD-10-CM | POA: Diagnosis not present

## 2022-06-15 DIAGNOSIS — T82898A Other specified complication of vascular prosthetic devices, implants and grafts, initial encounter: Secondary | ICD-10-CM | POA: Diagnosis not present

## 2022-06-15 DIAGNOSIS — Z992 Dependence on renal dialysis: Secondary | ICD-10-CM | POA: Diagnosis not present

## 2022-06-15 DIAGNOSIS — E1122 Type 2 diabetes mellitus with diabetic chronic kidney disease: Secondary | ICD-10-CM | POA: Insufficient documentation

## 2022-06-15 DIAGNOSIS — I132 Hypertensive heart and chronic kidney disease with heart failure and with stage 5 chronic kidney disease, or end stage renal disease: Secondary | ICD-10-CM | POA: Diagnosis not present

## 2022-06-15 DIAGNOSIS — N186 End stage renal disease: Secondary | ICD-10-CM | POA: Insufficient documentation

## 2022-06-15 DIAGNOSIS — I2510 Atherosclerotic heart disease of native coronary artery without angina pectoris: Secondary | ICD-10-CM | POA: Insufficient documentation

## 2022-06-15 DIAGNOSIS — Y841 Kidney dialysis as the cause of abnormal reaction of the patient, or of later complication, without mention of misadventure at the time of the procedure: Secondary | ICD-10-CM | POA: Insufficient documentation

## 2022-06-15 DIAGNOSIS — I509 Heart failure, unspecified: Secondary | ICD-10-CM | POA: Insufficient documentation

## 2022-06-15 DIAGNOSIS — Z794 Long term (current) use of insulin: Secondary | ICD-10-CM | POA: Diagnosis not present

## 2022-06-15 HISTORY — PX: A/V FISTULAGRAM: CATH118298

## 2022-06-15 LAB — POTASSIUM (ARMC VASCULAR LAB ONLY): Potassium (ARMC vascular lab): 4.6 mmol/L (ref 3.5–5.1)

## 2022-06-15 LAB — GLUCOSE, CAPILLARY: Glucose-Capillary: 134 mg/dL — ABNORMAL HIGH (ref 70–99)

## 2022-06-15 SURGERY — A/V FISTULAGRAM
Anesthesia: Moderate Sedation | Site: Arm Upper | Laterality: Right

## 2022-06-15 MED ORDER — HEPARIN SODIUM (PORCINE) 1000 UNIT/ML IJ SOLN
INTRAMUSCULAR | Status: AC
  Filled 2022-06-15: qty 10

## 2022-06-15 MED ORDER — CEFAZOLIN SODIUM-DEXTROSE 1-4 GM/50ML-% IV SOLN
INTRAVENOUS | Status: AC
  Administered 2022-06-15: 1 g via INTRAVENOUS
  Filled 2022-06-15: qty 50

## 2022-06-15 MED ORDER — FAMOTIDINE 20 MG PO TABS
ORAL_TABLET | ORAL | Status: AC
  Administered 2022-06-15: 40 mg via ORAL
  Filled 2022-06-15: qty 2

## 2022-06-15 MED ORDER — MIDAZOLAM HCL 2 MG/2ML IJ SOLN
INTRAMUSCULAR | Status: DC | PRN
  Administered 2022-06-15: 1 mg via INTRAVENOUS

## 2022-06-15 MED ORDER — METHYLPREDNISOLONE SODIUM SUCC 125 MG IJ SOLR: 125.0000 mg | Freq: Once | INTRAMUSCULAR | Status: AC | PRN

## 2022-06-15 MED ORDER — HYDROMORPHONE HCL 1 MG/ML IJ SOLN: 1.0000 mg | Freq: Once | INTRAMUSCULAR | Status: DC | PRN

## 2022-06-15 MED ORDER — DIPHENHYDRAMINE HCL 50 MG/ML IJ SOLN
INTRAMUSCULAR | Status: AC
  Administered 2022-06-15: 50 mg via INTRAVENOUS
  Filled 2022-06-15: qty 1

## 2022-06-15 MED ORDER — CEFAZOLIN SODIUM-DEXTROSE 1-4 GM/50ML-% IV SOLN: 1.0000 g | INTRAVENOUS | Status: AC

## 2022-06-15 MED ORDER — IODIXANOL 320 MG/ML IV SOLN
INTRAVENOUS | Status: DC | PRN
  Administered 2022-06-15: 20 mL

## 2022-06-15 MED ORDER — FENTANYL CITRATE (PF) 100 MCG/2ML IJ SOLN
INTRAMUSCULAR | Status: DC | PRN
  Administered 2022-06-15: 25 ug via INTRAVENOUS

## 2022-06-15 MED ORDER — MIDAZOLAM HCL 2 MG/2ML IJ SOLN
INTRAMUSCULAR | Status: AC
  Filled 2022-06-15: qty 2

## 2022-06-15 MED ORDER — MIDAZOLAM HCL 2 MG/ML PO SYRP
8.0000 mg | ORAL_SOLUTION | Freq: Once | ORAL | Status: DC | PRN
Start: 2022-06-15 — End: 2022-06-15

## 2022-06-15 MED ORDER — FAMOTIDINE 20 MG PO TABS: 40.0000 mg | ORAL_TABLET | Freq: Once | ORAL | Status: AC | PRN

## 2022-06-15 MED ORDER — FENTANYL CITRATE PF 50 MCG/ML IJ SOSY
PREFILLED_SYRINGE | INTRAMUSCULAR | Status: AC
  Filled 2022-06-15: qty 1

## 2022-06-15 MED ORDER — SODIUM CHLORIDE 0.9 % IV SOLN: INTRAVENOUS | Status: DC

## 2022-06-15 MED ORDER — DIPHENHYDRAMINE HCL 50 MG/ML IJ SOLN: 50.0000 mg | Freq: Once | INTRAMUSCULAR | Status: AC | PRN

## 2022-06-15 MED ORDER — ONDANSETRON HCL 4 MG/2ML IJ SOLN: 4.0000 mg | Freq: Four times a day (QID) | INTRAMUSCULAR | Status: DC | PRN

## 2022-06-15 MED ORDER — METHYLPREDNISOLONE SODIUM SUCC 125 MG IJ SOLR
INTRAMUSCULAR | Status: AC
  Administered 2022-06-15: 125 mg via INTRAVENOUS
  Filled 2022-06-15: qty 2

## 2022-06-15 SURGICAL SUPPLY — 10 items
CATH KUMPE SOFT-VU 5FR 65 (CATHETERS) ×1 IMPLANT
COVER PROBE U/S 5X48 (MISCELLANEOUS) ×1 IMPLANT
DRAPE BRACHIAL (DRAPES) ×1 IMPLANT
GUIDEWIRE ANGLED .035 180CM (WIRE) ×1 IMPLANT
NDL ENTRY 21GA 7CM ECHOTIP (NEEDLE) IMPLANT
NEEDLE ENTRY 21GA 7CM ECHOTIP (NEEDLE) ×2 IMPLANT
PACK ANGIOGRAPHY (CUSTOM PROCEDURE TRAY) ×2 IMPLANT
SET INTRO CAPELLA COAXIAL (SET/KITS/TRAYS/PACK) ×1 IMPLANT
SHEATH BRITE TIP 6FRX5.5 (SHEATH) ×1 IMPLANT
SUT MNCRL AB 4-0 PS2 18 (SUTURE) ×1 IMPLANT

## 2022-06-16 DIAGNOSIS — N2581 Secondary hyperparathyroidism of renal origin: Secondary | ICD-10-CM | POA: Diagnosis not present

## 2022-06-16 DIAGNOSIS — Z992 Dependence on renal dialysis: Secondary | ICD-10-CM | POA: Diagnosis not present

## 2022-06-16 DIAGNOSIS — N186 End stage renal disease: Secondary | ICD-10-CM | POA: Diagnosis not present

## 2022-06-18 DIAGNOSIS — N2581 Secondary hyperparathyroidism of renal origin: Secondary | ICD-10-CM | POA: Diagnosis not present

## 2022-06-18 DIAGNOSIS — N186 End stage renal disease: Secondary | ICD-10-CM | POA: Diagnosis not present

## 2022-06-18 DIAGNOSIS — Z992 Dependence on renal dialysis: Secondary | ICD-10-CM | POA: Diagnosis not present

## 2022-06-21 DIAGNOSIS — N2581 Secondary hyperparathyroidism of renal origin: Secondary | ICD-10-CM | POA: Diagnosis not present

## 2022-06-21 DIAGNOSIS — N186 End stage renal disease: Secondary | ICD-10-CM | POA: Diagnosis not present

## 2022-06-21 DIAGNOSIS — Z992 Dependence on renal dialysis: Secondary | ICD-10-CM | POA: Diagnosis not present

## 2022-06-23 DIAGNOSIS — N2581 Secondary hyperparathyroidism of renal origin: Secondary | ICD-10-CM | POA: Diagnosis not present

## 2022-06-23 DIAGNOSIS — N186 End stage renal disease: Secondary | ICD-10-CM | POA: Diagnosis not present

## 2022-06-23 DIAGNOSIS — Z992 Dependence on renal dialysis: Secondary | ICD-10-CM | POA: Diagnosis not present

## 2022-06-24 DIAGNOSIS — R2689 Other abnormalities of gait and mobility: Secondary | ICD-10-CM | POA: Diagnosis not present

## 2022-06-24 DIAGNOSIS — R202 Paresthesia of skin: Secondary | ICD-10-CM | POA: Diagnosis not present

## 2022-06-24 DIAGNOSIS — R531 Weakness: Secondary | ICD-10-CM | POA: Diagnosis not present

## 2022-06-24 DIAGNOSIS — I10 Essential (primary) hypertension: Secondary | ICD-10-CM | POA: Diagnosis not present

## 2022-06-24 DIAGNOSIS — G629 Polyneuropathy, unspecified: Secondary | ICD-10-CM | POA: Diagnosis not present

## 2022-06-24 DIAGNOSIS — R200 Anesthesia of skin: Secondary | ICD-10-CM | POA: Diagnosis not present

## 2022-06-24 DIAGNOSIS — Z992 Dependence on renal dialysis: Secondary | ICD-10-CM | POA: Diagnosis not present

## 2022-06-25 DIAGNOSIS — N2581 Secondary hyperparathyroidism of renal origin: Secondary | ICD-10-CM | POA: Diagnosis not present

## 2022-06-25 DIAGNOSIS — Z992 Dependence on renal dialysis: Secondary | ICD-10-CM | POA: Diagnosis not present

## 2022-06-25 DIAGNOSIS — E1122 Type 2 diabetes mellitus with diabetic chronic kidney disease: Secondary | ICD-10-CM | POA: Diagnosis not present

## 2022-06-25 DIAGNOSIS — N186 End stage renal disease: Secondary | ICD-10-CM | POA: Diagnosis not present

## 2022-06-28 DIAGNOSIS — N186 End stage renal disease: Secondary | ICD-10-CM | POA: Diagnosis not present

## 2022-06-28 DIAGNOSIS — Z992 Dependence on renal dialysis: Secondary | ICD-10-CM | POA: Diagnosis not present

## 2022-06-28 DIAGNOSIS — N2581 Secondary hyperparathyroidism of renal origin: Secondary | ICD-10-CM | POA: Diagnosis not present

## 2022-06-30 DIAGNOSIS — N2581 Secondary hyperparathyroidism of renal origin: Secondary | ICD-10-CM | POA: Diagnosis not present

## 2022-06-30 DIAGNOSIS — Z992 Dependence on renal dialysis: Secondary | ICD-10-CM | POA: Diagnosis not present

## 2022-06-30 DIAGNOSIS — N186 End stage renal disease: Secondary | ICD-10-CM | POA: Diagnosis not present

## 2022-07-01 DIAGNOSIS — Z0000 Encounter for general adult medical examination without abnormal findings: Secondary | ICD-10-CM | POA: Diagnosis not present

## 2022-07-01 DIAGNOSIS — N186 End stage renal disease: Secondary | ICD-10-CM | POA: Diagnosis not present

## 2022-07-01 DIAGNOSIS — E1122 Type 2 diabetes mellitus with diabetic chronic kidney disease: Secondary | ICD-10-CM | POA: Diagnosis not present

## 2022-07-01 DIAGNOSIS — I700 Atherosclerosis of aorta: Secondary | ICD-10-CM | POA: Diagnosis not present

## 2022-07-01 DIAGNOSIS — Z794 Long term (current) use of insulin: Secondary | ICD-10-CM | POA: Diagnosis not present

## 2022-07-01 DIAGNOSIS — Z992 Dependence on renal dialysis: Secondary | ICD-10-CM | POA: Diagnosis not present

## 2022-07-01 DIAGNOSIS — E113293 Type 2 diabetes mellitus with mild nonproliferative diabetic retinopathy without macular edema, bilateral: Secondary | ICD-10-CM | POA: Diagnosis not present

## 2022-07-01 DIAGNOSIS — F3340 Major depressive disorder, recurrent, in remission, unspecified: Secondary | ICD-10-CM | POA: Diagnosis not present

## 2022-07-02 DIAGNOSIS — Z992 Dependence on renal dialysis: Secondary | ICD-10-CM | POA: Diagnosis not present

## 2022-07-02 DIAGNOSIS — N186 End stage renal disease: Secondary | ICD-10-CM | POA: Diagnosis not present

## 2022-07-02 DIAGNOSIS — N2581 Secondary hyperparathyroidism of renal origin: Secondary | ICD-10-CM | POA: Diagnosis not present

## 2022-07-05 DIAGNOSIS — N186 End stage renal disease: Secondary | ICD-10-CM | POA: Diagnosis not present

## 2022-07-05 DIAGNOSIS — Z992 Dependence on renal dialysis: Secondary | ICD-10-CM | POA: Diagnosis not present

## 2022-07-05 DIAGNOSIS — N2581 Secondary hyperparathyroidism of renal origin: Secondary | ICD-10-CM | POA: Diagnosis not present

## 2022-07-07 DIAGNOSIS — N2581 Secondary hyperparathyroidism of renal origin: Secondary | ICD-10-CM | POA: Diagnosis not present

## 2022-07-07 DIAGNOSIS — N186 End stage renal disease: Secondary | ICD-10-CM | POA: Diagnosis not present

## 2022-07-07 DIAGNOSIS — Z992 Dependence on renal dialysis: Secondary | ICD-10-CM | POA: Diagnosis not present

## 2022-07-08 ENCOUNTER — Ambulatory Visit: Payer: Self-pay

## 2022-07-08 NOTE — Patient Outreach (Signed)
Care Coordination   Initial Visit Note   07/08/2022 Name: Terry Arroyo MRN: [id number] DOB: 1961-06-14  Terry Arroyo is a 61 y.o. year old female who sees Kirk Ruths, MD for primary care. I spoke with  Terry Arroyo by phone today  What matters to the patients health and wellness today?  Controlling her DM better   Goals Addressed               This Visit's Progress     RNCM:" I need help with my Diabetes" (pt-stated)        Care Coordination Interventions:  Lab Results  Component Value Date   HGBA1C 8.0 (H) 09/06/2021   Last A1C from pcp office 7 days ago: 07-01-2022 was 8.3  Education provided on the goal of A1C is less that 7.0 Provided education to patient about basic DM disease process. 07-08-2022: The patient admits she does not follow a heart healthy/ADA/Renal diet. She drinks apple juice and likes to have sweets. Agreed to receive information by email for healthy eating. Discussed sugar free options in beverages for the patient to try. Education and support given. The patient is receptive to learning how to better control her DM.  Reviewed medications with patient and discussed importance of medication adherence. 07-08-2022: The patient states when she was living in [place] she was on Januvia but she is not on this now. Education provided on asking the provider about options of medications she can take to help with lowering her blood sugars that will not impact her ESRD and being on dialysis  Counseled on importance of regular laboratory monitoring as prescribed. 07-08-2022: The patient has blood work on a regular basis Discussed plans with patient for ongoing care management follow up and provided patient with direct contact information for care management team. Provided the patient with the [place] number. The patient agrees to care coordination services and working with the [facility] to help effectively manage her DM and other chronic conditions.  Provided patient  with written educational materials related to hypo and hyperglycemia and importance of correct treatment. 07-08-2022: Review of sx and sx of hypo and hyperglycemia. The patient states that the lowest she has seen recently is 60 and highest >250. Education provided by email and the [facility] system.  Reviewed scheduled/upcoming provider appointments including:states she saw pcp recently and sees the endocrinologist on a consistent basis Advised patient, providing education and rationale, to check cbg TID and record, calling pcp or endocrinologist  for findings outside established parameters. The patient states usually her fasting blood sugars are around 202. Education provided on the goals of fasting blood sugars of <130 and post prandial of <180.  Review of patient status, including review of consultants reports, relevant laboratory and other test results, and medications completed Screening for signs and symptoms of depression related to chronic disease state  Assessed social determinant of health barriers           SDOH assessments and interventions completed:   Yes SDOH Interventions Today    Flowsheet Row Most Recent Value  SDOH Interventions   Food Insecurity Interventions Intervention Not Indicated  Housing Interventions Intervention Not Indicated  Transportation Interventions Intervention Not Indicated      The patient consented and agrees to care coordination services and outreaches by the [facility]  Care Coordination Interventions Activated:  Yes Care Coordination Interventions:  Yes, provided  Follow up plan: Follow up call scheduled for 08-10-2022 at 1030 am  Encounter Outcome:  Pt. Visit Completed  Noreene Larsson RN, MSN, [place] [facility] Mobile: [phone]

## 2022-07-08 NOTE — Patient Instructions (Signed)
Visit Information  Thank you for taking time to visit with me today. Please don't hesitate to contact me if I can be of assistance to you before our next scheduled telephone appointment.  Following are the goals we discussed today:  RNCM:" I need help with my Diabetes" (pt-stated)                 Care Coordination Interventions:        Lab Results  Component Value Date    HGBA1C 8.0 (H) 09/06/2021   Last A1C from pcp office 7 days ago: 07-01-2022 was 8.3   Education provided on the goal of A1C is less that 7.0 Provided education to patient about basic DM disease process. 07-08-2022: The patient admits she does not follow a heart healthy/ADA/Renal diet. She drinks apple juice and likes to have sweets. Agreed to receive information by email for healthy eating. Discussed sugar free options in beverages for the patient to try. Education and support given. The patient is receptive to learning how to better control her DM.  Reviewed medications with patient and discussed importance of medication adherence. 07-08-2022: The patient states when she was living in [place] she was on Januvia but she is not on this now. Education provided on asking the provider about options of medications she can take to help with lowering her blood sugars that will not impact her ESRD and being on dialysis  Counseled on importance of regular laboratory monitoring as prescribed. 07-08-2022: The patient has blood work on a regular basis Discussed plans with patient for ongoing care management follow up and provided patient with direct contact information for care management team. Provided the patient with the [place] number. The patient agrees to care coordination services and working with the [facility] to help effectively manage her DM and other chronic conditions.  Provided patient with written educational materials related to hypo and hyperglycemia and importance of correct treatment. 07-08-2022: Review of sx and sx of hypo and  hyperglycemia. The patient states that the lowest she has seen recently is 41 and highest >250. Education provided by email and the [facility] system.  Reviewed scheduled/upcoming provider appointments including:states she saw pcp recently and sees the endocrinologist on a consistent basis Advised patient, providing education and rationale, to check cbg TID and record, calling pcp or endocrinologist  for findings outside established parameters. The patient states usually her fasting blood sugars are around 202. Education provided on the goals of fasting blood sugars of <130 and post prandial of <180.  Review of patient status, including review of consultants reports, relevant laboratory and other test results, and medications completed Screening for signs and symptoms of depression related to chronic disease state  Assessed social determinant of health barriers        Our next appointment is by telephone on 08-10-2022 at 1030 am  Please call the care guide team at [phone] if you need to cancel or reschedule your appointment.   If you are experiencing a Mental Health or [place] or need someone to talk to, please call the Suicide and Crisis Lifeline: 988 call the [place] National Suicide Prevention Lifeline: 684-409-9067 or TTY: 612-620-4931 TTY (435)140-3945) to talk to a trained counselor call 1-800-273-TALK (toll free, 24 hour hotline)   Following is a copy of your full plan of care:  There are no care plans that you recently modified to display for this patient.   Terry Arroyo was given information about Care Management services by the embedded  care coordination team including:  Care Management services include personalized support from designated clinical staff supervised by her physician, including individualized plan of care and coordination with other care providers 24/7 contact phone numbers for assistance for urgent and routine care needs. The patient may stop CCM services  at any time (effective at the end of the month) by phone call to the office staff.  Patient agreed to services and verbal consent obtained.   The patient verbalized understanding of instructions, educational materials, and care plan provided today and DECLINED offer to receive copy of patient instructions, educational materials, and care plan.   Telephone follow up appointment with care management team member scheduled for: 08-10-2022 at 38 am  [place], MSN, [place] [facility] Mobile: [phone]

## 2022-07-09 DIAGNOSIS — N186 End stage renal disease: Secondary | ICD-10-CM | POA: Diagnosis not present

## 2022-07-09 DIAGNOSIS — Z992 Dependence on renal dialysis: Secondary | ICD-10-CM | POA: Diagnosis not present

## 2022-07-09 DIAGNOSIS — N2581 Secondary hyperparathyroidism of renal origin: Secondary | ICD-10-CM | POA: Diagnosis not present

## 2022-07-12 DIAGNOSIS — N2581 Secondary hyperparathyroidism of renal origin: Secondary | ICD-10-CM | POA: Diagnosis not present

## 2022-07-12 DIAGNOSIS — Z992 Dependence on renal dialysis: Secondary | ICD-10-CM | POA: Diagnosis not present

## 2022-07-12 DIAGNOSIS — N186 End stage renal disease: Secondary | ICD-10-CM | POA: Diagnosis not present

## 2022-07-13 NOTE — Progress Notes (Deleted)
MRN : [id number]  Terry Arroyo is a 61 y.o. (07/24/1961) female who presents with chief complaint of check access.  History of Present Illness:   The patient returns to the office for followup of their dialysis access.   06/15/2022 Contrast injection of right brachial axillary AV graft: Based on the images, the graft itself appears to be in good condition it is widely patent.  Previously placed stent in the venous outflow is widely patent with less than 10% residual stenosis.  Arterial anastomosis is widely patent as are the visualized portions of the brachial artery.  The central venous anatomy is widely patent there is a mild perhaps 30% narrowing at the proximal subclavian which is not hemodynamically significant.   The patient reports the function of the access has been stable. Patient denies difficulty with cannulation. The patient denies increased bleeding time after removing the needles. The patient denies hand pain or other symptoms consistent with steal phenomena.  No significant arm swelling.  The patient denies any complaints from the dialysis center or their nephrologist.  The patient denies redness or swelling at the access site. The patient denies fever or chills at home or while on dialysis.  No recent shortening of the patient's walking distance or new symptoms consistent with claudication.  No history of rest pain symptoms. No new ulcers or wounds of the lower extremities have occurred.  The patient denies amaurosis fugax or recent TIA symptoms. There are no recent neurological changes noted. There is no history of DVT, PE or superficial thrombophlebitis. No recent episodes of angina or shortness of breath documented.   Duplex ultrasound of the AV access shows a patent access.  The previously noted stenosis is not significantly changed compared to last study.  Flow volume today is *** cc/min (previous flow volume was *** cc/min)     No  outpatient medications have been marked as taking for the 07/15/22 encounter (Appointment) with Delana Meyer, [name] Lory, MD.    Past Medical History:  Diagnosis Date   Anemia    Angina at rest [facility])    Angioedema 03/20/2021   Lisinopril   Anterior cerebral aneurysm    approximately 5 mm; left MCA    Anxiety    Aortic atherosclerosis (HCC)    Asthma    Blind left eye    CHF (congestive heart failure) (HCC)    COPD (chronic obstructive pulmonary disease) ([place])    Coronary artery disease    Depression    DOE (dyspnea on exertion)    ESRD (end stage renal disease) ([place])    Gait instability    Genital herpes    GERD (gastroesophageal reflux disease)    HFrEF (heart failure with reduced ejection fraction) ([place])    History of 2019 novel coronavirus disease (COVID-19) 03/2019   [place]   History of DVT (deep vein thrombosis)    History of DVT (deep vein thrombosis)    Hx of CABG 2018   x 4; performed in [place]   Hyperlipidemia    Hypertension    MI (myocardial infarction) ([place]) 2018   no stents   Pituitary adenoma ([place])    PVD (posterior vitreous detachment), right eye    Secondary hyperparathyroidism ([place])    Sleep apnea    non-compliant with nocturnal PAP therapy   Stroke ([place]) 2014  stated affected left side.   T2DM (type 2 diabetes mellitus) [facility])     Past Surgical History:  Procedure Laterality Date   A/V FISTULAGRAM Right 12/29/2021   Procedure: A/V FISTULAGRAM;  Surgeon: Katha Cabal, MD;  Location: [place] [facility];  Service: Cardiovascular;  Laterality: Right;   A/V FISTULAGRAM Right 06/15/2022   Procedure: A/V Fistulagram;  Surgeon: Katha Cabal, MD;  Location: [place] [facility];  Service: Cardiovascular;  Laterality: Right;   AV FISTULA PLACEMENT Right 01/28/2021   Procedure: ARTERIOVENOUS (AV) FISTULA CREATION;  Surgeon: Katha Cabal, MD;  Location: [facility] ORS;  Service: Vascular;  Laterality: Right;   AV FISTULA  PLACEMENT Right 05/13/2021   Procedure: INSERTION OF ARTERIOVENOUS (AV) GORE-TEX GRAFT ARM ( BRACHIAL AXILLARY );  Surgeon: Katha Cabal, MD;  Location: [facility] ORS;  Service: Vascular;  Laterality: Right;   [place]   pituitary tumor   BREAST EXCISIONAL BIOPSY Left yrs ago   benign   CESAREAN SECTION     CORONARY ARTERY BYPASS GRAFT  2018   x 4 vessels; performed in [place] N/A 11/07/2020   Procedure: DIALYSIS/PERMA CATHETER INSERTION;  Surgeon: Algernon Huxley, MD;  Location: [place] [facility];  Service: Cardiovascular;  Laterality: N/A;   DIALYSIS/PERMA CATHETER REMOVAL N/A 06/12/2021   Procedure: DIALYSIS/PERMA CATHETER REMOVAL;  Surgeon: Algernon Huxley, MD;  Location: [place] [facility];  Service: Cardiovascular;  Laterality: N/A;   ESOPHAGOGASTRODUODENOSCOPY (EGD) WITH PROPOFOL N/A 12/22/2021   Procedure: ESOPHAGOGASTRODUODENOSCOPY (EGD) WITH PROPOFOL;  Surgeon: Lesly Rubenstein, MD;  Location: [facility] ENDOSCOPY;  Service: Gastroenterology;  Laterality: N/A;  IDDM   EYE SURGERY Right 2021   cataracts    PERIPHERAL VASCULAR THROMBECTOMY Right 04/03/2021   Procedure: A/V Fistulagram ;  Surgeon: Katha Cabal, MD;  Location: [place] [facility];  Service: Cardiovascular;  Laterality: Right;    Social History Social History   Tobacco Use   Smoking status: Former    Types: Cigarettes    Quit date: 2018    Years since quitting: 5.5   Smokeless tobacco: Never  Vaping Use   Vaping Use: Never used  Substance Use Topics   Alcohol use: Not Currently   Drug use: Not Currently    Family History Family History  Problem Relation Age of Onset   CAD Mother    CAD Brother    Breast cancer Sister 43    Allergies  Allergen Reactions   Hydralazine Hives, Shortness Of Breath, Swelling and Rash    Body aches    Kiwi Extract Hives, Swelling and Rash    Mouth swells    Lisinopril Swelling    Angioedema   Shellfish Allergy  Anaphylaxis    Shrimp/lobster  Betadine leaves welts on skin     Betadine [Povidone Iodine] Hives and Rash    LEAVES WELTS ON SKIN   Septra [Sulfamethoxazole-Trimethoprim]    Metformin Diarrhea and Other (See Comments)    GI Intolerance   Tape Itching    Use paper tape whenever possible     REVIEW OF SYSTEMS (Negative unless checked)  Constitutional: '[]'$ Weight loss  '[]'$ Fever  '[]'$ Chills Cardiac: '[]'$ Chest pain   '[]'$ Chest pressure   '[]'$ Palpitations   '[]'$ Shortness of breath when laying flat   '[]'$ Shortness of breath with exertion. Vascular:  '[]'$ Pain in legs with walking   '[]'$ Pain in legs at rest  '[]'$ History of DVT   '[]'$ Phlebitis   '[]'$ Swelling in legs   '[]'$   Varicose veins   '[]'$ Non-healing ulcers Pulmonary:   '[]'$ Uses home oxygen   '[]'$ Productive cough   '[]'$ Hemoptysis   '[]'$ Wheeze  '[]'$ COPD   '[]'$ Asthma Neurologic:  '[]'$ Dizziness   '[]'$ Seizures   '[]'$ History of stroke   '[]'$ History of TIA  '[]'$ Aphasia   '[]'$ Vissual changes   '[]'$ Weakness or numbness in arm   '[]'$ Weakness or numbness in leg Musculoskeletal:   '[]'$ Joint swelling   '[]'$ Joint pain   '[]'$ Low back pain Hematologic:  '[]'$ Easy bruising  '[]'$ Easy bleeding   '[]'$ Hypercoagulable state   '[]'$ Anemic Gastrointestinal:  '[]'$ Diarrhea   '[]'$ Vomiting  '[]'$ Gastroesophageal reflux/heartburn   '[]'$ Difficulty swallowing. Genitourinary:  '[x]'$ Chronic kidney disease   '[]'$ Difficult urination  '[]'$ Frequent urination   '[]'$ Blood in urine Skin:  '[]'$ Rashes   '[]'$ Ulcers  Psychological:  '[]'$ History of anxiety   '[]'$  History of major depression.  Physical Examination  There were no vitals filed for this visit. There is no height or weight on file to calculate BMI. Gen: WD/WN, NAD Head: [place]/AT, No temporalis wasting.  Ear/Nose/Throat: Hearing grossly intact, nares w/o erythema or drainage Eyes: PER, EOMI, sclera nonicteric.  Neck: Supple, no gross masses or lesions.  No JVD.  Pulmonary:  Good air movement, no audible wheezing, no use of accessory muscles.  Cardiac: RRR, precordium non-hyperdynamic. Vascular:   *** Vessel Right  Left  Radial Palpable Palpable  Brachial Palpable Palpable  Gastrointestinal: soft, non-distended. No guarding/no peritoneal signs.  Musculoskeletal: M/S 5/5 throughout.  No deformity.  Neurologic: CN 2-12 intact. Pain and light touch intact in extremities.  Symmetrical.  Speech is fluent. Motor exam as listed above. Psychiatric: Judgment intact, Mood & affect appropriate for pt's clinical situation. Dermatologic: No rashes or ulcers noted.  No changes consistent with cellulitis.   CBC Lab Results  Component Value Date   WBC 5.4 06/01/2022   HGB 10.9 (L) 06/01/2022   HCT 37.0 06/01/2022   MCV 91.8 06/01/2022   PLT 149 (L) 06/01/2022    BMET    Component Value Date/Time   NA 141 06/01/2022 0958   NA 134 (L) 11/18/2013 0138   K 4.7 06/01/2022 0958   K 3.8 11/18/2013 0138   CL 105 06/01/2022 0958   CL 98 11/18/2013 0138   CO2 24 06/01/2022 0958   CO2 29 11/18/2013 0138   GLUCOSE 159 (H) 06/01/2022 0958   GLUCOSE 348 (H) 11/18/2013 0138   BUN 22 (H) 06/01/2022 0958   BUN 11 11/18/2013 0138   CREATININE 4.53 (H) 06/01/2022 0958   CREATININE 0.87 11/18/2013 0138   CALCIUM 8.5 (L) 06/01/2022 0958   CALCIUM 9.4 11/18/2013 0138   GFRNONAA 11 (L) 06/01/2022 0958   GFRNONAA >60 11/18/2013 0138   GFRAA 18 (L) 09/15/2020 0403   GFRAA >60 11/18/2013 0138   CrCl cannot be calculated (Patient's most recent lab result is older than the maximum 21 days allowed.).  COAG Lab Results  Component Value Date   INR 1.2 03/29/2022   INR 1.0 05/07/2021   INR 1.1 01/20/2021    Radiology PERIPHERAL VASCULAR CATHETERIZATION  Result Date: 06/15/2022 See surgical note for result.    Assessment/Plan There are no diagnoses linked to this encounter.   [name], MD  07/13/2022 8:24 AM

## 2022-07-14 DIAGNOSIS — N186 End stage renal disease: Secondary | ICD-10-CM | POA: Diagnosis not present

## 2022-07-14 DIAGNOSIS — N2581 Secondary hyperparathyroidism of renal origin: Secondary | ICD-10-CM | POA: Diagnosis not present

## 2022-07-14 DIAGNOSIS — Z992 Dependence on renal dialysis: Secondary | ICD-10-CM | POA: Diagnosis not present

## 2022-07-15 ENCOUNTER — Ambulatory Visit (INDEPENDENT_AMBULATORY_CARE_PROVIDER_SITE_OTHER): Payer: Medicare HMO | Admitting: Vascular Surgery

## 2022-07-15 DIAGNOSIS — I25118 Atherosclerotic heart disease of native coronary artery with other forms of angina pectoris: Secondary | ICD-10-CM

## 2022-07-15 DIAGNOSIS — N186 End stage renal disease: Secondary | ICD-10-CM

## 2022-07-15 DIAGNOSIS — E1169 Type 2 diabetes mellitus with other specified complication: Secondary | ICD-10-CM

## 2022-07-15 DIAGNOSIS — I10 Essential (primary) hypertension: Secondary | ICD-10-CM

## 2022-07-16 DIAGNOSIS — N2581 Secondary hyperparathyroidism of renal origin: Secondary | ICD-10-CM | POA: Diagnosis not present

## 2022-07-16 DIAGNOSIS — Z992 Dependence on renal dialysis: Secondary | ICD-10-CM | POA: Diagnosis not present

## 2022-07-16 DIAGNOSIS — N186 End stage renal disease: Secondary | ICD-10-CM | POA: Diagnosis not present

## 2022-07-19 DIAGNOSIS — N186 End stage renal disease: Secondary | ICD-10-CM | POA: Diagnosis not present

## 2022-07-19 DIAGNOSIS — Z992 Dependence on renal dialysis: Secondary | ICD-10-CM | POA: Diagnosis not present

## 2022-07-19 DIAGNOSIS — N2581 Secondary hyperparathyroidism of renal origin: Secondary | ICD-10-CM | POA: Diagnosis not present

## 2022-07-20 ENCOUNTER — Other Ambulatory Visit: Payer: Self-pay | Admitting: Internal Medicine

## 2022-07-20 DIAGNOSIS — Z1231 Encounter for screening mammogram for malignant neoplasm of breast: Secondary | ICD-10-CM

## 2022-07-21 DIAGNOSIS — N186 End stage renal disease: Secondary | ICD-10-CM | POA: Diagnosis not present

## 2022-07-21 DIAGNOSIS — Z992 Dependence on renal dialysis: Secondary | ICD-10-CM | POA: Diagnosis not present

## 2022-07-21 DIAGNOSIS — N2581 Secondary hyperparathyroidism of renal origin: Secondary | ICD-10-CM | POA: Diagnosis not present

## 2022-07-23 DIAGNOSIS — Z992 Dependence on renal dialysis: Secondary | ICD-10-CM | POA: Diagnosis not present

## 2022-07-23 DIAGNOSIS — N2581 Secondary hyperparathyroidism of renal origin: Secondary | ICD-10-CM | POA: Diagnosis not present

## 2022-07-23 DIAGNOSIS — N186 End stage renal disease: Secondary | ICD-10-CM | POA: Diagnosis not present

## 2022-07-26 DIAGNOSIS — N186 End stage renal disease: Secondary | ICD-10-CM | POA: Diagnosis not present

## 2022-07-26 DIAGNOSIS — N2581 Secondary hyperparathyroidism of renal origin: Secondary | ICD-10-CM | POA: Diagnosis not present

## 2022-07-26 DIAGNOSIS — Z992 Dependence on renal dialysis: Secondary | ICD-10-CM | POA: Diagnosis not present

## 2022-07-28 DIAGNOSIS — N186 End stage renal disease: Secondary | ICD-10-CM | POA: Diagnosis not present

## 2022-07-28 DIAGNOSIS — N2581 Secondary hyperparathyroidism of renal origin: Secondary | ICD-10-CM | POA: Diagnosis not present

## 2022-07-28 DIAGNOSIS — Z992 Dependence on renal dialysis: Secondary | ICD-10-CM | POA: Diagnosis not present

## 2022-07-30 DIAGNOSIS — Z992 Dependence on renal dialysis: Secondary | ICD-10-CM | POA: Diagnosis not present

## 2022-07-30 DIAGNOSIS — N2581 Secondary hyperparathyroidism of renal origin: Secondary | ICD-10-CM | POA: Diagnosis not present

## 2022-07-30 DIAGNOSIS — N186 End stage renal disease: Secondary | ICD-10-CM | POA: Diagnosis not present

## 2022-08-02 DIAGNOSIS — Z992 Dependence on renal dialysis: Secondary | ICD-10-CM | POA: Diagnosis not present

## 2022-08-02 DIAGNOSIS — N2581 Secondary hyperparathyroidism of renal origin: Secondary | ICD-10-CM | POA: Diagnosis not present

## 2022-08-02 DIAGNOSIS — N186 End stage renal disease: Secondary | ICD-10-CM | POA: Diagnosis not present

## 2022-08-03 ENCOUNTER — Encounter: Admission: RE | Payer: Self-pay | Source: Ambulatory Visit

## 2022-08-03 ENCOUNTER — Ambulatory Visit: Admission: RE | Admit: 2022-08-03 | Payer: Medicare HMO | Source: Ambulatory Visit

## 2022-08-03 SURGERY — COLONOSCOPY WITH PROPOFOL
Anesthesia: General

## 2022-08-04 DIAGNOSIS — Z992 Dependence on renal dialysis: Secondary | ICD-10-CM | POA: Diagnosis not present

## 2022-08-04 DIAGNOSIS — N2581 Secondary hyperparathyroidism of renal origin: Secondary | ICD-10-CM | POA: Diagnosis not present

## 2022-08-04 DIAGNOSIS — N186 End stage renal disease: Secondary | ICD-10-CM | POA: Diagnosis not present

## 2022-08-04 NOTE — Progress Notes (Signed)
MRN : [id number]  Terry Arroyo is a 61 y.o. ([birth year]) female who presents with chief complaint of check access.  History of Present Illness:   The patient returns to the office for followup of their dialysis access. She is S/P angiogram 06/15/2022:  Based on the images, the graft itself appears to be in good condition it is widely patent.  Previously placed stent in the venous outflow is widely patent with less than 10% residual stenosis.  Arterial anastomosis is widely patent as are the visualized portions of the brachial artery.  The central venous anatomy is widely patent there is a mild perhaps 30% narrowing at the proximal subclavian which is not hemodynamically significant.   The patient reports the function of the access has been stable. Patient denies difficulty with cannulation. The patient denies increased bleeding time after removing the needles. The patient denies hand pain or other symptoms consistent with steal phenomena.  No significant arm swelling.  The patient denies any complaints from the dialysis center or their nephrologist.  The patient denies redness or swelling at the access site. The patient denies fever or chills at home or while on dialysis.  No recent shortening of the patient's walking distance or new symptoms consistent with claudication.  No history of rest pain symptoms. No new ulcers or wounds of the lower extremities have occurred.  The patient denies amaurosis fugax or recent TIA symptoms. There are no recent neurological changes noted. There is no history of DVT, PE or superficial thrombophlebitis. No recent episodes of angina or shortness of breath documented.   Duplex ultrasound of the AV access shows a patent access.  The previously noted stenosis is not significantly changed compared to last study.  Flow volume today is 1980 cc/min (previous flow volume was 2827 cc/min)  ABI's Rt=1.09 and Lt=1.08  (previous ABI's Rt=1.06  and Lt=0.97)     No outpatient medications have been marked as taking for the 08/05/22 encounter (Appointment) with Delana Meyer, [name] Lory, MD.    Past Medical History:  Diagnosis Date   Anemia    Angina at rest [facility])    Angioedema 03/20/2021   Lisinopril   Anterior cerebral aneurysm    approximately 5 mm; left MCA    Anxiety    Aortic atherosclerosis (HCC)    Asthma    Blind left eye    CHF (congestive heart failure) (HCC)    COPD (chronic obstructive pulmonary disease) (HCC)    Coronary artery disease    Depression    DOE (dyspnea on exertion)    ESRD (end stage renal disease) ([place])    Gait instability    Genital herpes    GERD (gastroesophageal reflux disease)    HFrEF (heart failure with reduced ejection fraction) ([place])    History of 2019 novel coronavirus disease (COVID-19) 03/2019   [place]   History of DVT (deep vein thrombosis)    History of DVT (deep vein thrombosis)    Hx of CABG 2018   x 4; performed in [place]   Hyperlipidemia    Hypertension    MI (myocardial infarction) ([place]) 2018   no stents   Pituitary adenoma ([place])    PVD (posterior vitreous detachment), right eye    Secondary hyperparathyroidism ([place])    Sleep apnea    non-compliant with nocturnal PAP therapy  Stroke [facility]) 2014   stated affected left side.   T2DM (type 2 diabetes mellitus) [facility])     Past Surgical History:  Procedure Laterality Date   A/V FISTULAGRAM Right 12/29/2021   Procedure: A/V FISTULAGRAM;  Surgeon: Katha Cabal, MD;  Location: [place] [facility] LAB;  Service: Cardiovascular;  Laterality: Right;   A/V FISTULAGRAM Right 06/15/2022   Procedure: A/V Fistulagram;  Surgeon: Katha Cabal, MD;  Location: [place] CV LAB;  Service: Cardiovascular;  Laterality: Right;   AV FISTULA PLACEMENT Right 01/28/2021   Procedure: ARTERIOVENOUS (AV) FISTULA CREATION;  Surgeon: Katha Cabal, MD;  Location: [facility] ORS;  Service: Vascular;  Laterality:  Right;   AV FISTULA PLACEMENT Right 05/13/2021   Procedure: INSERTION OF ARTERIOVENOUS (AV) GORE-TEX GRAFT ARM ( BRACHIAL AXILLARY );  Surgeon: Katha Cabal, MD;  Location: [facility] ORS;  Service: Vascular;  Laterality: Right;   [place]   pituitary tumor   BREAST EXCISIONAL BIOPSY Left yrs ago   benign   CESAREAN SECTION     CORONARY ARTERY BYPASS GRAFT  2018   x 4 vessels; performed in [place] N/A 11/07/2020   Procedure: DIALYSIS/PERMA CATHETER INSERTION;  Surgeon: Algernon Huxley, MD;  Location: [place] CV LAB;  Service: Cardiovascular;  Laterality: N/A;   DIALYSIS/PERMA CATHETER REMOVAL N/A 06/12/2021   Procedure: DIALYSIS/PERMA CATHETER REMOVAL;  Surgeon: Algernon Huxley, MD;  Location: [place] CV LAB;  Service: Cardiovascular;  Laterality: N/A;   ESOPHAGOGASTRODUODENOSCOPY (EGD) WITH PROPOFOL N/A 12/22/2021   Procedure: ESOPHAGOGASTRODUODENOSCOPY (EGD) WITH PROPOFOL;  Surgeon: Lesly Rubenstein, MD;  Location: [facility] ENDOSCOPY;  Service: Gastroenterology;  Laterality: N/A;  IDDM   EYE SURGERY Right 2021   cataracts    PERIPHERAL VASCULAR THROMBECTOMY Right 04/03/2021   Procedure: A/V Fistulagram ;  Surgeon: Katha Cabal, MD;  Location: [place] [facility] LAB;  Service: Cardiovascular;  Laterality: Right;    Social History Social History   Tobacco Use   Smoking status: Former    Types: Cigarettes    Quit date: 2018    Years since quitting: 5.6   Smokeless tobacco: Never  Vaping Use   Vaping Use: Never used  Substance Use Topics   Alcohol use: Not Currently   Drug use: Not Currently    Family History Family History  Problem Relation Age of Onset   CAD Mother    CAD Brother    Breast cancer Sister 25    Allergies  Allergen Reactions   Hydralazine Hives, Shortness Of Breath, Swelling and Rash    Body aches    Kiwi Extract Hives, Swelling and Rash    Mouth swells    Lisinopril Swelling    Angioedema    Shellfish Allergy Anaphylaxis    Shrimp/lobster  Betadine leaves welts on skin     Betadine [Povidone Iodine] Hives and Rash    LEAVES WELTS ON SKIN   Septra [Sulfamethoxazole-Trimethoprim]    Metformin Diarrhea and Other (See Comments)    GI Intolerance   Tape Itching    Use paper tape whenever possible     REVIEW OF SYSTEMS (Negative unless checked)  Constitutional: '[]'$ Weight loss  '[]'$ Fever  '[]'$ Chills Cardiac: '[]'$ Chest pain   '[]'$ Chest pressure   '[]'$ Palpitations   '[]'$ Shortness of breath when laying flat   '[]'$ Shortness of breath with exertion. Vascular:  '[]'$ Pain in legs with walking   '[]'$ Pain in legs at rest  '[]'$ History of DVT   '[]'$ Phlebitis   '[]'$   Swelling in legs   '[]'$ Varicose veins   '[]'$ Non-healing ulcers Pulmonary:   '[]'$ Uses home oxygen   '[]'$ Productive cough   '[]'$ Hemoptysis   '[]'$ Wheeze  '[]'$ COPD   '[]'$ Asthma Neurologic:  '[]'$ Dizziness   '[]'$ Seizures   '[]'$ History of stroke   '[]'$ History of TIA  '[]'$ Aphasia   '[]'$ Vissual changes   '[]'$ Weakness or numbness in arm   '[]'$ Weakness or numbness in leg Musculoskeletal:   '[]'$ Joint swelling   '[]'$ Joint pain   '[]'$ Low back pain Hematologic:  '[]'$ Easy bruising  '[]'$ Easy bleeding   '[]'$ Hypercoagulable state   '[]'$ Anemic Gastrointestinal:  '[]'$ Diarrhea   '[]'$ Vomiting  '[]'$ Gastroesophageal reflux/heartburn   '[]'$ Difficulty swallowing. Genitourinary:  '[x]'$ Chronic kidney disease   '[]'$ Difficult urination  '[]'$ Frequent urination   '[]'$ Blood in urine Skin:  '[]'$ Rashes   '[]'$ Ulcers  Psychological:  '[]'$ History of anxiety   '[]'$  History of major depression.  Physical Examination  There were no vitals filed for this visit. There is no height or weight on file to calculate BMI. Gen: WD/WN, NAD Head: [place]/AT, No temporalis wasting.  Ear/Nose/Throat: Hearing grossly intact, nares w/o erythema or drainage Eyes: PER, EOMI, sclera nonicteric.  Neck: Supple, no gross masses or lesions.  No JVD.  Pulmonary:  Good air movement, no audible wheezing, no use of accessory muscles.  Cardiac: RRR, precordium non-hyperdynamic. Vascular:    Rt arm AV graft good thrill good bruit Vessel Right Left  Radial Palpable Palpable  Brachial Palpable Palpable  DP Not Palpable  Not  Palpable   PT Trace Palpable  Trace Palpable   Gastrointestinal: soft, non-distended. No guarding/no peritoneal signs.  Musculoskeletal: M/S 5/5 throughout.  No deformity.  Neurologic: CN 2-12 intact. Pain and light touch intact in extremities.  Symmetrical.  Speech is fluent. Motor exam as listed above. Psychiatric: Judgment intact, Mood & affect appropriate for pt's clinical situation. Dermatologic: No rashes or ulcers noted.  No changes consistent with cellulitis.   CBC Lab Results  Component Value Date   WBC 5.4 06/01/2022   HGB 10.9 (L) 06/01/2022   HCT 37.0 06/01/2022   MCV 91.8 06/01/2022   PLT 149 (L) 06/01/2022    BMET    Component Value Date/Time   NA 141 06/01/2022 0958   NA 134 (L) 11/18/2013 0138   K 4.7 06/01/2022 0958   K 3.8 11/18/2013 0138   CL 105 06/01/2022 0958   CL 98 11/18/2013 0138   CO2 24 06/01/2022 0958   CO2 29 11/18/2013 0138   GLUCOSE 159 (H) 06/01/2022 0958   GLUCOSE 348 (H) 11/18/2013 0138   BUN 22 (H) 06/01/2022 0958   BUN 11 11/18/2013 0138   CREATININE 4.53 (H) 06/01/2022 0958   CREATININE 0.87 11/18/2013 0138   CALCIUM 8.5 (L) 06/01/2022 0958   CALCIUM 9.4 11/18/2013 0138   GFRNONAA 11 (L) 06/01/2022 0958   GFRNONAA >60 11/18/2013 0138   GFRAA 18 (L) 09/15/2020 0403   GFRAA >60 11/18/2013 0138   CrCl cannot be calculated (Patient's most recent lab result is older than the maximum 21 days allowed.).  COAG Lab Results  Component Value Date   INR 1.2 03/29/2022   INR 1.0 05/07/2021   INR 1.1 01/20/2021    Radiology No results found.   Assessment/Plan 1. End-stage renal disease on hemodialysis [facility]) Recommend:  The patient is doing well and currently has adequate dialysis access. The patient's dialysis center is not reporting any access issues. Flow pattern is stable when compared to the  prior ultrasound.  The patient should have a duplex ultrasound of the  dialysis access in 6 months. The patient will follow-up with me in the office after each ultrasound    2. Type 2 diabetes mellitus without complication, unspecified whether long term insulin use (HCC) Continue hypoglycemic medications as already ordered, these medications have been reviewed and there are no changes at this time.  Hgb A1C to be monitored as already arranged by primary service   3. Essential hypertension Continue antihypertensive medications as already ordered, these medications have been reviewed and there are no changes at this time.   4. Coronary artery disease of native artery of native heart with stable angina pectoris (HCC) Continue cardiac and antihypertensive medications as already ordered and reviewed, no changes at this time.  Continue statin as ordered and reviewed, no changes at this time  Nitrates PRN for chest pain   5. Dyslipidemia Continue statin as ordered and reviewed, no changes at this time     [name], MD  08/04/2022 7:51 AM

## 2022-08-05 ENCOUNTER — Ambulatory Visit (INDEPENDENT_AMBULATORY_CARE_PROVIDER_SITE_OTHER): Payer: Medicare HMO | Admitting: Vascular Surgery

## 2022-08-05 ENCOUNTER — Ambulatory Visit (INDEPENDENT_AMBULATORY_CARE_PROVIDER_SITE_OTHER): Payer: Medicare HMO

## 2022-08-05 ENCOUNTER — Encounter (INDEPENDENT_AMBULATORY_CARE_PROVIDER_SITE_OTHER): Payer: Self-pay | Admitting: Vascular Surgery

## 2022-08-05 VITALS — BP 150/74 | HR 61 | Resp 17 | Ht 66.0 in | Wt 160.4 lb

## 2022-08-05 DIAGNOSIS — E119 Type 2 diabetes mellitus without complications: Secondary | ICD-10-CM | POA: Diagnosis not present

## 2022-08-05 DIAGNOSIS — Z992 Dependence on renal dialysis: Secondary | ICD-10-CM

## 2022-08-05 DIAGNOSIS — I10 Essential (primary) hypertension: Secondary | ICD-10-CM | POA: Diagnosis not present

## 2022-08-05 DIAGNOSIS — E785 Hyperlipidemia, unspecified: Secondary | ICD-10-CM | POA: Diagnosis not present

## 2022-08-05 DIAGNOSIS — I25118 Atherosclerotic heart disease of native coronary artery with other forms of angina pectoris: Secondary | ICD-10-CM | POA: Diagnosis not present

## 2022-08-05 DIAGNOSIS — I70213 Atherosclerosis of native arteries of extremities with intermittent claudication, bilateral legs: Secondary | ICD-10-CM | POA: Diagnosis not present

## 2022-08-05 DIAGNOSIS — N186 End stage renal disease: Secondary | ICD-10-CM

## 2022-08-06 ENCOUNTER — Encounter (INDEPENDENT_AMBULATORY_CARE_PROVIDER_SITE_OTHER): Payer: Self-pay | Admitting: Vascular Surgery

## 2022-08-06 DIAGNOSIS — N186 End stage renal disease: Secondary | ICD-10-CM | POA: Diagnosis not present

## 2022-08-06 DIAGNOSIS — N2581 Secondary hyperparathyroidism of renal origin: Secondary | ICD-10-CM | POA: Diagnosis not present

## 2022-08-06 DIAGNOSIS — Z992 Dependence on renal dialysis: Secondary | ICD-10-CM | POA: Diagnosis not present

## 2022-08-09 DIAGNOSIS — Z992 Dependence on renal dialysis: Secondary | ICD-10-CM | POA: Diagnosis not present

## 2022-08-09 DIAGNOSIS — N2581 Secondary hyperparathyroidism of renal origin: Secondary | ICD-10-CM | POA: Diagnosis not present

## 2022-08-09 DIAGNOSIS — N186 End stage renal disease: Secondary | ICD-10-CM | POA: Diagnosis not present

## 2022-08-10 ENCOUNTER — Ambulatory Visit: Payer: Self-pay

## 2022-08-10 NOTE — Patient Instructions (Signed)
Visit Information  Thank you for taking time to visit with me today. Please don't hesitate to contact me if I can be of assistance to you.   Following are the goals we discussed today:   Goals Addressed               This Visit's Progress     RNCM: "I am having a hard time sleeping" (pt-stated)        Care Coordination Interventions: Evaluation of current treatment plan related to insomnia and patient's adherence to plan as established by provider Advised patient to not take more than prescribed medications for sleep and the RNCM would contact the provider for recommendations Provided education to patient re: how to enhance sleep habits, not watching tv before bedtime, not eating late at night. The patient states she has dialysis therefore she does not stay up late because on dialysis days she is tired. Reviewed medications with patient and discussed The patient states that she takes Remeron 30 mg at bedtime but it is not helping her rest. She ask if the medications could be increased. Education on the max dose being 45 mg and the provider would need to be contacted. RNCM to reach out to the pcp.  Collaborated with Olivia Mackie with Dr. Tonette Bihari office regarding the  patients stated need that she is not sleeping well at night and she would like an increase in her Remeron or to try another sleep aide. Olivia Mackie took down detailed information and will let the nurse and pcp know.  Provided patient with mindfulness  educational materials related to relaxation Discussed plans with patient for ongoing care management follow up and provided patient with direct contact information for care management team Advised patient to discuss insomnia and concerns with not getting adequate rest with provider         RNCM:" I need help with my Diabetes" (pt-stated)        Care Coordination Interventions:  Lab Results  Component Value Date   HGBA1C 8.0 (H) 09/06/2021   Last A1C from pcp office 7 days ago:  07-01-2022 was 8.3  Education provided on the goal of A1C is less that 7.0 Provided education to patient about basic DM disease process. 07-08-2022: The patient admits she does not follow a heart healthy/ADA/Renal diet. She drinks apple juice and likes to have sweets. Agreed to receive information by email for healthy eating. Discussed sugar free options in beverages for the patient to try. Education and support given. The patient is receptive to learning how to better control her DM.  Reviewed medications with patient and discussed importance of medication adherence. 07-08-2022: The patient states when she was living in [place] she was on Januvia but she is not on this now. Education provided on asking the provider about options of medications she can take to help with lowering her blood sugars that will not impact her ESRD and being on dialysis. 08-10-2022: Reminder given to discuss options with the provider for better control of her DM. Counseled on importance of regular laboratory monitoring as prescribed. 07-08-2022: The patient has blood work on a regular basis Discussed plans with patient for ongoing care management follow up and provided patient with direct contact information for care management team. Provided the patient with the [place] number. The patient agrees to care coordination services and working with the [facility] to help effectively manage her DM and other chronic conditions.  Provided patient with written educational materials related to hypo and hyperglycemia and importance  of correct treatment. 07-08-2022: Review of sx and sx of hypo and hyperglycemia. The patient states that the lowest she has seen recently is 107 and highest >250. Education provided by email and the [facility] system. 08-10-2022: The patient states that her blood sugars have been up and down and her range has been 170's to 180's. States it was 178 this am. Education and support given.  Reviewed scheduled/upcoming provider appointments  including:states she saw pcp recently and sees the endocrinologist on a consistent basis Advised patient, providing education and rationale, to check cbg TID and record, calling pcp or endocrinologist  for findings outside established parameters. The patient states usually her fasting blood sugars are around 202. 08-10-2022: Education provided on the goals of fasting blood sugars of <130 and post prandial of <180.  Review of patient status, including review of consultants reports, relevant laboratory and other test results, and medications completed Screening for signs and symptoms of depression related to chronic disease state  Assessed social determinant of health barriers           Our next appointment is by telephone on 11-16-2022 at 1115 am  Please call the care guide team at [phone] if you need to cancel or reschedule your appointment.   If you are experiencing a Mental Health or [place] or need someone to talk to, please call the Suicide and Crisis Lifeline: 988 call the [place] National Suicide Prevention Lifeline: 830 342 7001 or TTY: (224)299-7972 TTY 351-325-1659) to talk to a trained counselor call 1-800-273-TALK (toll free, 24 hour hotline)  The patient verbalized understanding of instructions, educational materials, and care plan provided today and DECLINED offer to receive copy of patient instructions, educational materials, and care plan.   Telephone follow up appointment with care management team member scheduled for: 11-16-2022 at 56 am  Noreene Larsson RN, MSN, [facility] Mobile: [phone]

## 2022-08-10 NOTE — Patient Outreach (Signed)
Care Coordination   Follow Up Visit Note   08/10/2022 Name: Terry Arroyo MRN: [id number] DOB: 22-Dec-1960  Terry GEPPERT is a 61 y.o. year old female who sees Kirk Ruths, MD for primary care. I spoke with  Terry Arroyo by phone today  What matters to the patients health and wellness today?  Blood sugars are up and down and not sleeping well    Goals Addressed               This Visit's Progress     RNCM: "I am having a hard time sleeping" (pt-stated)        Care Coordination Interventions: Evaluation of current treatment plan related to insomnia and patient's adherence to plan as established by provider Advised patient to not take more than prescribed medications for sleep and the RNCM would contact the provider for recommendations Provided education to patient re: how to enhance sleep habits, not watching tv before bedtime, not eating late at night. The patient states she has dialysis therefore she does not stay up late because on dialysis days she is tired. Reviewed medications with patient and discussed The patient states that she takes Remeron 30 mg at bedtime but it is not helping her rest. She ask if the medications could be increased. Education on the max dose being 45 mg and the provider would need to be contacted. RNCM to reach out to the pcp.  Collaborated with Terry Arroyo with Dr. Tonette Bihari office regarding the  patients stated need that she is not sleeping well at night and she would like an increase in her Remeron or to try another sleep aide. Terry Arroyo took down detailed information and will let the nurse and pcp know.  Provided patient with mindfulness  educational materials related to relaxation Discussed plans with patient for ongoing care management follow up and provided patient with direct contact information for care management team Advised patient to discuss insomnia and concerns with not getting adequate rest with provider         RNCM:" I need help with  my Diabetes" (pt-stated)        Care Coordination Interventions:  Lab Results  Component Value Date   HGBA1C 8.0 (H) 09/06/2021   Last A1C from pcp office 7 days ago: 07-01-2022 was 8.3  Education provided on the goal of A1C is less that 7.0 Provided education to patient about basic DM disease process. 07-08-2022: The patient admits she does not follow a heart healthy/ADA/Renal diet. She drinks apple juice and likes to have sweets. Agreed to receive information by email for healthy eating. Discussed sugar free options in beverages for the patient to try. Education and support given. The patient is receptive to learning how to better control her DM.  Reviewed medications with patient and discussed importance of medication adherence. 07-08-2022: The patient states when she was living in [place] she was on Januvia but she is not on this now. Education provided on asking the provider about options of medications she can take to help with lowering her blood sugars that will not impact her ESRD and being on dialysis. 08-10-2022: Reminder given to discuss options with the provider for better control of her DM. Counseled on importance of regular laboratory monitoring as prescribed. 07-08-2022: The patient has blood work on a regular basis Discussed plans with patient for ongoing care management follow up and provided patient with direct contact information for care management team. Provided the patient with the [place] number. The  patient agrees to care coordination services and working with the [facility] to help effectively manage her DM and other chronic conditions.  Provided patient with written educational materials related to hypo and hyperglycemia and importance of correct treatment. 07-08-2022: Review of sx and sx of hypo and hyperglycemia. The patient states that the lowest she has seen recently is 5 and highest >250. Education provided by email and the [facility] system. 08-10-2022: The patient states that her blood  sugars have been up and down and her range has been 170's to 180's. States it was 178 this am. Education and support given.  Reviewed scheduled/upcoming provider appointments including:states she saw pcp recently and sees the endocrinologist on a consistent basis Advised patient, providing education and rationale, to check cbg TID and record, calling pcp or endocrinologist  for findings outside established parameters. The patient states usually her fasting blood sugars are around 202. 08-10-2022: Education provided on the goals of fasting blood sugars of <130 and post prandial of <180.  Review of patient status, including review of consultants reports, relevant laboratory and other test results, and medications completed Screening for signs and symptoms of depression related to chronic disease state  Assessed social determinant of health barriers           SDOH assessments and interventions completed:  No     Care Coordination Interventions Activated:  Yes  Care Coordination Interventions:  Yes, provided   Follow up plan: Follow up call scheduled for 11-16-2022    Encounter Outcome:  Pt. Visit Completed   Terry Larsson RN, MSN, [facility] Mobile: [phone]

## 2022-08-11 DIAGNOSIS — N186 End stage renal disease: Secondary | ICD-10-CM | POA: Diagnosis not present

## 2022-08-11 DIAGNOSIS — N2581 Secondary hyperparathyroidism of renal origin: Secondary | ICD-10-CM | POA: Diagnosis not present

## 2022-08-11 DIAGNOSIS — Z992 Dependence on renal dialysis: Secondary | ICD-10-CM | POA: Diagnosis not present

## 2022-08-13 DIAGNOSIS — Z992 Dependence on renal dialysis: Secondary | ICD-10-CM | POA: Diagnosis not present

## 2022-08-13 DIAGNOSIS — N2581 Secondary hyperparathyroidism of renal origin: Secondary | ICD-10-CM | POA: Diagnosis not present

## 2022-08-13 DIAGNOSIS — N186 End stage renal disease: Secondary | ICD-10-CM | POA: Diagnosis not present

## 2022-08-16 DIAGNOSIS — Z992 Dependence on renal dialysis: Secondary | ICD-10-CM | POA: Diagnosis not present

## 2022-08-16 DIAGNOSIS — N186 End stage renal disease: Secondary | ICD-10-CM | POA: Diagnosis not present

## 2022-08-16 DIAGNOSIS — N2581 Secondary hyperparathyroidism of renal origin: Secondary | ICD-10-CM | POA: Diagnosis not present

## 2022-08-18 DIAGNOSIS — N2581 Secondary hyperparathyroidism of renal origin: Secondary | ICD-10-CM | POA: Diagnosis not present

## 2022-08-18 DIAGNOSIS — N186 End stage renal disease: Secondary | ICD-10-CM | POA: Diagnosis not present

## 2022-08-18 DIAGNOSIS — Z992 Dependence on renal dialysis: Secondary | ICD-10-CM | POA: Diagnosis not present

## 2022-08-19 DIAGNOSIS — E1122 Type 2 diabetes mellitus with diabetic chronic kidney disease: Secondary | ICD-10-CM | POA: Diagnosis not present

## 2022-08-19 DIAGNOSIS — Z992 Dependence on renal dialysis: Secondary | ICD-10-CM | POA: Diagnosis not present

## 2022-08-19 DIAGNOSIS — N186 End stage renal disease: Secondary | ICD-10-CM | POA: Diagnosis not present

## 2022-08-20 DIAGNOSIS — N2581 Secondary hyperparathyroidism of renal origin: Secondary | ICD-10-CM | POA: Diagnosis not present

## 2022-08-20 DIAGNOSIS — N186 End stage renal disease: Secondary | ICD-10-CM | POA: Diagnosis not present

## 2022-08-20 DIAGNOSIS — Z992 Dependence on renal dialysis: Secondary | ICD-10-CM | POA: Diagnosis not present

## 2022-08-23 DIAGNOSIS — N2581 Secondary hyperparathyroidism of renal origin: Secondary | ICD-10-CM | POA: Diagnosis not present

## 2022-08-23 DIAGNOSIS — Z992 Dependence on renal dialysis: Secondary | ICD-10-CM | POA: Diagnosis not present

## 2022-08-23 DIAGNOSIS — N186 End stage renal disease: Secondary | ICD-10-CM | POA: Diagnosis not present

## 2022-08-25 DIAGNOSIS — N2581 Secondary hyperparathyroidism of renal origin: Secondary | ICD-10-CM | POA: Diagnosis not present

## 2022-08-25 DIAGNOSIS — Z992 Dependence on renal dialysis: Secondary | ICD-10-CM | POA: Diagnosis not present

## 2022-08-25 DIAGNOSIS — N186 End stage renal disease: Secondary | ICD-10-CM | POA: Diagnosis not present

## 2022-08-27 DIAGNOSIS — N2581 Secondary hyperparathyroidism of renal origin: Secondary | ICD-10-CM | POA: Diagnosis not present

## 2022-08-27 DIAGNOSIS — Z992 Dependence on renal dialysis: Secondary | ICD-10-CM | POA: Diagnosis not present

## 2022-08-27 DIAGNOSIS — N186 End stage renal disease: Secondary | ICD-10-CM | POA: Diagnosis not present

## 2022-08-30 DIAGNOSIS — N186 End stage renal disease: Secondary | ICD-10-CM | POA: Diagnosis not present

## 2022-08-30 DIAGNOSIS — Z992 Dependence on renal dialysis: Secondary | ICD-10-CM | POA: Diagnosis not present

## 2022-08-30 DIAGNOSIS — N2581 Secondary hyperparathyroidism of renal origin: Secondary | ICD-10-CM | POA: Diagnosis not present

## 2022-09-01 DIAGNOSIS — Z992 Dependence on renal dialysis: Secondary | ICD-10-CM | POA: Diagnosis not present

## 2022-09-01 DIAGNOSIS — N186 End stage renal disease: Secondary | ICD-10-CM | POA: Diagnosis not present

## 2022-09-01 DIAGNOSIS — N2581 Secondary hyperparathyroidism of renal origin: Secondary | ICD-10-CM | POA: Diagnosis not present

## 2022-09-03 DIAGNOSIS — Z992 Dependence on renal dialysis: Secondary | ICD-10-CM | POA: Diagnosis not present

## 2022-09-03 DIAGNOSIS — N186 End stage renal disease: Secondary | ICD-10-CM | POA: Diagnosis not present

## 2022-09-03 DIAGNOSIS — N2581 Secondary hyperparathyroidism of renal origin: Secondary | ICD-10-CM | POA: Diagnosis not present

## 2022-09-06 DIAGNOSIS — Z992 Dependence on renal dialysis: Secondary | ICD-10-CM | POA: Diagnosis not present

## 2022-09-06 DIAGNOSIS — N2581 Secondary hyperparathyroidism of renal origin: Secondary | ICD-10-CM | POA: Diagnosis not present

## 2022-09-06 DIAGNOSIS — N186 End stage renal disease: Secondary | ICD-10-CM | POA: Diagnosis not present

## 2022-09-08 DIAGNOSIS — N186 End stage renal disease: Secondary | ICD-10-CM | POA: Diagnosis not present

## 2022-09-08 DIAGNOSIS — N2581 Secondary hyperparathyroidism of renal origin: Secondary | ICD-10-CM | POA: Diagnosis not present

## 2022-09-08 DIAGNOSIS — Z992 Dependence on renal dialysis: Secondary | ICD-10-CM | POA: Diagnosis not present

## 2022-09-10 DIAGNOSIS — N186 End stage renal disease: Secondary | ICD-10-CM | POA: Diagnosis not present

## 2022-09-10 DIAGNOSIS — Z992 Dependence on renal dialysis: Secondary | ICD-10-CM | POA: Diagnosis not present

## 2022-09-10 DIAGNOSIS — N2581 Secondary hyperparathyroidism of renal origin: Secondary | ICD-10-CM | POA: Diagnosis not present

## 2022-09-13 DIAGNOSIS — N186 End stage renal disease: Secondary | ICD-10-CM | POA: Diagnosis not present

## 2022-09-13 DIAGNOSIS — Z992 Dependence on renal dialysis: Secondary | ICD-10-CM | POA: Diagnosis not present

## 2022-09-13 DIAGNOSIS — N2581 Secondary hyperparathyroidism of renal origin: Secondary | ICD-10-CM | POA: Diagnosis not present

## 2022-09-15 DIAGNOSIS — N2581 Secondary hyperparathyroidism of renal origin: Secondary | ICD-10-CM | POA: Diagnosis not present

## 2022-09-15 DIAGNOSIS — Z992 Dependence on renal dialysis: Secondary | ICD-10-CM | POA: Diagnosis not present

## 2022-09-15 DIAGNOSIS — N186 End stage renal disease: Secondary | ICD-10-CM | POA: Diagnosis not present

## 2022-09-17 DIAGNOSIS — N186 End stage renal disease: Secondary | ICD-10-CM | POA: Diagnosis not present

## 2022-09-17 DIAGNOSIS — Z992 Dependence on renal dialysis: Secondary | ICD-10-CM | POA: Diagnosis not present

## 2022-09-17 DIAGNOSIS — N2581 Secondary hyperparathyroidism of renal origin: Secondary | ICD-10-CM | POA: Diagnosis not present

## 2022-09-18 DIAGNOSIS — N186 End stage renal disease: Secondary | ICD-10-CM | POA: Diagnosis not present

## 2022-09-18 DIAGNOSIS — Z992 Dependence on renal dialysis: Secondary | ICD-10-CM | POA: Diagnosis not present

## 2022-09-20 DIAGNOSIS — Z992 Dependence on renal dialysis: Secondary | ICD-10-CM | POA: Diagnosis not present

## 2022-09-20 DIAGNOSIS — N2581 Secondary hyperparathyroidism of renal origin: Secondary | ICD-10-CM | POA: Diagnosis not present

## 2022-09-20 DIAGNOSIS — N186 End stage renal disease: Secondary | ICD-10-CM | POA: Diagnosis not present

## 2022-09-22 DIAGNOSIS — N2581 Secondary hyperparathyroidism of renal origin: Secondary | ICD-10-CM | POA: Diagnosis not present

## 2022-09-22 DIAGNOSIS — Z992 Dependence on renal dialysis: Secondary | ICD-10-CM | POA: Diagnosis not present

## 2022-09-22 DIAGNOSIS — N186 End stage renal disease: Secondary | ICD-10-CM | POA: Diagnosis not present

## 2022-09-24 DIAGNOSIS — N186 End stage renal disease: Secondary | ICD-10-CM | POA: Diagnosis not present

## 2022-09-24 DIAGNOSIS — N2581 Secondary hyperparathyroidism of renal origin: Secondary | ICD-10-CM | POA: Diagnosis not present

## 2022-09-24 DIAGNOSIS — Z992 Dependence on renal dialysis: Secondary | ICD-10-CM | POA: Diagnosis not present

## 2022-09-27 DIAGNOSIS — E119 Type 2 diabetes mellitus without complications: Secondary | ICD-10-CM | POA: Diagnosis not present

## 2022-09-27 DIAGNOSIS — Z794 Long term (current) use of insulin: Secondary | ICD-10-CM | POA: Diagnosis not present

## 2022-09-27 DIAGNOSIS — N2581 Secondary hyperparathyroidism of renal origin: Secondary | ICD-10-CM | POA: Diagnosis not present

## 2022-09-27 DIAGNOSIS — N186 End stage renal disease: Secondary | ICD-10-CM | POA: Diagnosis not present

## 2022-09-27 DIAGNOSIS — Z992 Dependence on renal dialysis: Secondary | ICD-10-CM | POA: Diagnosis not present

## 2022-09-29 ENCOUNTER — Other Ambulatory Visit: Payer: Self-pay

## 2022-09-29 ENCOUNTER — Emergency Department
Admission: EM | Admit: 2022-09-29 | Discharge: 2022-09-29 | Disposition: A | Discharge status: Home / Self Care | Payer: Medicare HMO | Attending: Emergency Medicine | Admitting: Emergency Medicine

## 2022-09-29 ENCOUNTER — Emergency Department: Payer: Medicare HMO

## 2022-09-29 DIAGNOSIS — I130 Hypertensive heart and chronic kidney disease with heart failure and stage 1 through stage 4 chronic kidney disease, or unspecified chronic kidney disease: Secondary | ICD-10-CM | POA: Insufficient documentation

## 2022-09-29 DIAGNOSIS — N189 Chronic kidney disease, unspecified: Secondary | ICD-10-CM | POA: Insufficient documentation

## 2022-09-29 DIAGNOSIS — M546 Pain in thoracic spine: Secondary | ICD-10-CM

## 2022-09-29 DIAGNOSIS — R001 Bradycardia, unspecified: Secondary | ICD-10-CM | POA: Insufficient documentation

## 2022-09-29 DIAGNOSIS — R519 Headache, unspecified: Secondary | ICD-10-CM | POA: Insufficient documentation

## 2022-09-29 DIAGNOSIS — M542 Cervicalgia: Secondary | ICD-10-CM | POA: Diagnosis not present

## 2022-09-29 DIAGNOSIS — R7989 Other specified abnormal findings of blood chemistry: Secondary | ICD-10-CM | POA: Insufficient documentation

## 2022-09-29 DIAGNOSIS — E1122 Type 2 diabetes mellitus with diabetic chronic kidney disease: Secondary | ICD-10-CM | POA: Insufficient documentation

## 2022-09-29 DIAGNOSIS — I509 Heart failure, unspecified: Secondary | ICD-10-CM | POA: Insufficient documentation

## 2022-09-29 DIAGNOSIS — N2581 Secondary hyperparathyroidism of renal origin: Secondary | ICD-10-CM | POA: Diagnosis not present

## 2022-09-29 DIAGNOSIS — Z992 Dependence on renal dialysis: Secondary | ICD-10-CM | POA: Diagnosis not present

## 2022-09-29 DIAGNOSIS — R079 Chest pain, unspecified: Secondary | ICD-10-CM

## 2022-09-29 DIAGNOSIS — R0789 Other chest pain: Secondary | ICD-10-CM | POA: Diagnosis not present

## 2022-09-29 DIAGNOSIS — M549 Dorsalgia, unspecified: Secondary | ICD-10-CM | POA: Diagnosis not present

## 2022-09-29 DIAGNOSIS — N186 End stage renal disease: Secondary | ICD-10-CM | POA: Diagnosis not present

## 2022-09-29 LAB — COMPREHENSIVE METABOLIC PANEL
ALT: 12 U/L (ref 0–44)
AST: 16 U/L (ref 15–41)
Albumin: 3.8 g/dL (ref 3.5–5.0)
Alkaline Phosphatase: 139 U/L — ABNORMAL HIGH (ref 38–126)
Anion gap: 10 (ref 5–15)
BUN: 24 mg/dL — ABNORMAL HIGH (ref 6–20)
CO2: 31 mmol/L (ref 22–32)
Calcium: 8.7 mg/dL — ABNORMAL LOW (ref 8.9–10.3)
Chloride: 99 mmol/L (ref 98–111)
Creatinine, Ser: 4.75 mg/dL — ABNORMAL HIGH (ref 0.44–1.00)
GFR, Estimated: 10 mL/min — ABNORMAL LOW (ref >60)
Glucose, Bld: 233 mg/dL — ABNORMAL HIGH (ref 70–99)
Potassium: 4.2 mmol/L (ref 3.5–5.1)
Sodium: 140 mmol/L (ref 135–145)
Total Bilirubin: 0.8 mg/dL (ref 0.3–1.2)
Total Protein: 8.4 g/dL — ABNORMAL HIGH (ref 6.5–8.1)

## 2022-09-29 LAB — CBC WITH DIFFERENTIAL/PLATELET
Abs Immature Granulocytes: 0.04 10*3/uL (ref 0.00–0.07)
Basophils Absolute: 0.1 10*3/uL (ref 0.0–0.1)
Basophils Relative: 1 %
Eosinophils Absolute: 0.4 10*3/uL (ref 0.0–0.5)
Eosinophils Relative: 4 %
HCT: 38.6 % (ref 36.0–46.0)
Hemoglobin: 12 g/dL (ref 12.0–15.0)
Immature Granulocytes: 1 %
Lymphocytes Relative: 18 %
Lymphs Abs: 1.5 10*3/uL (ref 0.7–4.0)
MCH: 28 pg (ref 26.0–34.0)
MCHC: 31.1 g/dL (ref 30.0–36.0)
MCV: 90 fL (ref 80.0–100.0)
Monocytes Absolute: 0.8 10*3/uL (ref 0.1–1.0)
Monocytes Relative: 10 %
Neutro Abs: 5.4 10*3/uL (ref 1.7–7.7)
Neutrophils Relative %: 66 %
Platelets: 193 10*3/uL (ref 150–400)
RBC: 4.29 MIL/uL (ref 3.87–5.11)
RDW: 15.5 % (ref 11.5–15.5)
WBC: 8.1 10*3/uL (ref 4.0–10.5)
nRBC: 0 % (ref 0.0–0.2)

## 2022-09-29 LAB — TROPONIN I (HIGH SENSITIVITY)
Troponin I (High Sensitivity): 27 ng/L — ABNORMAL HIGH (ref <18)
Troponin I (High Sensitivity): 31 ng/L — ABNORMAL HIGH (ref <18)

## 2022-09-29 LAB — LIPASE, BLOOD: Lipase: 58 U/L — ABNORMAL HIGH (ref 11–51)

## 2022-09-29 MED ORDER — HYDROCODONE-ACETAMINOPHEN 5-325 MG PO TABS: 1.0000 | ORAL_TABLET | Freq: Four times a day (QID) | ORAL | 0 refills | Status: AC | PRN

## 2022-09-29 MED ORDER — ONDANSETRON 4 MG PO TBDP: 4.0000 mg | ORAL_TABLET | Freq: Three times a day (TID) | ORAL | 0 refills | Status: AC | PRN

## 2022-09-29 MED ORDER — OXYCODONE-ACETAMINOPHEN 5-325 MG PO TABS
1.0000 | ORAL_TABLET | Freq: Once | ORAL | Status: AC
  Administered 2022-09-29: 1 via ORAL
  Filled 2022-09-29: qty 1

## 2022-09-29 MED ORDER — ONDANSETRON 4 MG PO TBDP
4.0000 mg | ORAL_TABLET | Freq: Once | ORAL | Status: AC
  Administered 2022-09-29: 4 mg via ORAL
  Filled 2022-09-29: qty 1

## 2022-09-29 NOTE — ED Provider Notes (Signed)
[facility] Provider Note  Patient Contact: 3:50 PM (approximate)   History   Back Pain   HPI  Terry Arroyo is a 61 y.o. female with a history of hypertensive urgency, type 2 diabetes, chronic kidney disease acute on chronic CHF and anemia, presents to the emergency department with acute left-sided chest pain and upper back pain.  Patient also states that she has occipital headache.  Patient states that symptoms started while she was lifting heavy mattress.  She states that she feels as though symptoms came on suddenly and have persisted with constant pain.  She does state that her grip strength on the left seems less than normal.  She denies chest tightness or shortness of breath.  No vomiting or abdominal pain.      Physical Exam   Triage Vital Signs: ED Triage Vitals  Enc Vitals Group     BP 09/29/22 1427 135/62     Pulse Rate 09/29/22 1427 (!) 57     Resp 09/29/22 1427 19     Temp 09/29/22 1427 97.8 F (36.6 C)     Temp Source 09/29/22 1427 Oral     SpO2 09/29/22 1427 99 %     Weight 09/29/22 1426 178 lb (80.7 kg)     Height 09/29/22 1426 '5\' 6"'$  (1.676 m)     Head Circumference --      Peak Flow --      Pain Score 09/29/22 1426 10     Pain Loc --      Pain Edu? --      Excl. in [place]? --     Most recent vital signs: Vitals:   09/29/22 1427 09/29/22 2112  BP: 135/62 123/70  Pulse: (!) 57 61  Resp: 19 20  Temp: 97.8 F (36.6 C) 98.1 F (36.7 C)  SpO2: 99% 93%     General: Alert and in no acute distress. Eyes:  PERRL. EOMI. Head: No acute traumatic findings ENT:      Nose: No congestion/rhinnorhea.      Mouth/Throat: Mucous membranes are moist.  Neck: No stridor. No cervical spine tenderness to palpation. Cardiovascular:  Good peripheral perfusion. Patient has reproducible tenderness to palpation over left chest wall.  Respiratory: Normal respiratory effort without tachypnea or retractions. Lungs CTAB. Good air entry to the bases  with no decreased or absent breath sounds. Gastrointestinal: Bowel sounds 4 quadrants. Soft and nontender to palpation. No guarding or rigidity. No palpable masses. No distention. No CVA tenderness. Musculoskeletal: Full range of motion to all extremities.  Neurologic:  No gross focal neurologic deficits are appreciated.  Skin:   No rash noted Other:   ED Results / Procedures / Treatments   Labs (all labs ordered are listed, but only abnormal results are displayed) Labs Reviewed  COMPREHENSIVE METABOLIC PANEL - Abnormal; Notable for the following components:      Result Value   Glucose, Bld 233 (*)    BUN 24 (*)    Creatinine, Ser 4.75 (*)    Calcium 8.7 (*)    Total Protein 8.4 (*)    Alkaline Phosphatase 139 (*)    GFR, Estimated 10 (*)    All other components within normal limits  LIPASE, BLOOD - Abnormal; Notable for the following components:   Lipase 58 (*)    All other components within normal limits  TROPONIN I (HIGH SENSITIVITY) - Abnormal; Notable for the following components:   Troponin I (High Sensitivity) 27 (*)  All other components within normal limits  TROPONIN I (HIGH SENSITIVITY) - Abnormal; Notable for the following components:   Troponin I (High Sensitivity) 31 (*)    All other components within normal limits  CBC WITH DIFFERENTIAL/PLATELET  TROPONIN I (HIGH SENSITIVITY)     EKG  Sinus bradycardia with incomplete left bundle branch block.    RADIOLOGY  I personally viewed and evaluated these images as part of my medical decision making, as well as reviewing the written report by the radiologist.  ED Provider Interpretation: CTs of the head, cervical spine and thoracic spine unremarkable.  Chest x-ray indicates cardiomegaly with vascular congestion.   PROCEDURES:  Critical Care performed: No  Procedures   MEDICATIONS ORDERED IN ED: Medications  oxyCODONE-acetaminophen (PERCOCET/ROXICET) 5-325 MG per tablet 1 tablet (1 tablet Oral Given  09/29/22 1829)  ondansetron (ZOFRAN-ODT) disintegrating tablet 4 mg (4 mg Oral Given 09/29/22 1829)  oxyCODONE-acetaminophen (PERCOCET/ROXICET) 5-325 MG per tablet 1 tablet (1 tablet Oral Given 09/29/22 2236)     IMPRESSION / MDM / ASSESSMENT AND PLAN / ED COURSE  I reviewed the triage vital signs and the nursing notes.                              Assessment and plan:  Chest pain Back pain 61 year old female presents to the emergency department with left-sided chest pain and upper back pain after heavy lifting injury.  Patient was mildly bradycardic at triage but vital signs were otherwise reassuring.  On exam, patient was alert and nontoxic-appearing.  She did have reproducible tenderness to palpation over the left lateral chest wall.  She had no midline thoracic spine tenderness.  I did notice that she had some mildly diminished grip strength in the left compared to the right.  I am going to obtain CTs of the head, cervical spine and thoracic spine as I am concerned that patient endorses occipital headache with her current symptoms.  Differential diagnosis at this time includes intracranial bleed, disc extrusion/herniation, pneumothorax, rib fracture, muscle strain, muscle spasm, STEMI...  CT of the head, cervical spine and thoracic spine unremarkable for acute fracture or intracranial bleed.  Patient CMP largely consistent with baseline.  Mild elevation of troponin at 27 and 31 respectively which is significantly less than recent prior labs.  Given reproducible nature of the pain, low suspicion for NSTEMI.  I reviewed care plan with attending, Dr. Charna Archer and we agreed to discharge patient with strict return precautions to return with new or worsening symptoms.  Patient was discharged with a short course of Norco for pain.  All patient questions were answered.      FINAL CLINICAL IMPRESSION(S) / ED DIAGNOSES   Final diagnoses:  Acute bilateral thoracic back pain  Nonspecific chest  pain     Rx / DC Orders   ED Discharge Orders          Ordered    HYDROcodone-acetaminophen (NORCO) 5-325 MG tablet  Every 6 hours PRN        09/29/22 2227    ondansetron (ZOFRAN-ODT) 4 MG disintegrating tablet  Every 8 hours PRN        09/29/22 2227             Note:  This document was prepared using Dragon voice recognition software and may include unintentional dictation errors.   Vallarie Mare [place], PA-C 09/29/22 2244    Blake Divine, MD 09/29/22 2258

## 2022-09-29 NOTE — ED Triage Notes (Signed)
Pt BIB EMS for back pain after moving a mattress. Per pt, the pain is also in her left chest and down her left arm.

## 2022-09-29 NOTE — ED Notes (Signed)
Lab called to draw blood

## 2022-09-29 NOTE — ED Provider Triage Note (Signed)
Emergency Medicine Provider Triage Evaluation Note  Terry Arroyo , a 61 y.o. female  was evaluated in triage.  Pt complains of upper back pain, left shoulder pain and left anterior chest pain after moving a mattress on Saturday. Taking tylenol for pain without relief.    Review of Systems  Positive: Left back and shoulder pain.  + dialysis patient Negative: No SOB, direct trauma or head injury  Physical Exam  There were no vitals taken for this visit. Gen:   Awake, no distress   Resp:  Normal effort  MSK:   Left shoulder muscles tender to light palpation but no deformity.  Tender left thoracic paravertebral muscles.  ROM restricted due to increase pain but no crepitus.   Other:    Medical Decision Making  Medically screening exam initiated at 2:25 PM.  Appropriate orders placed.  Terry Arroyo was informed that the remainder of the evaluation will be completed by another provider, this initial triage assessment does not replace that evaluation, and the importance of remaining in the ED until their evaluation is complete.     Johnn Hai, PA-C 09/29/22 1430

## 2022-10-01 DIAGNOSIS — N186 End stage renal disease: Secondary | ICD-10-CM | POA: Diagnosis not present

## 2022-10-01 DIAGNOSIS — N2581 Secondary hyperparathyroidism of renal origin: Secondary | ICD-10-CM | POA: Diagnosis not present

## 2022-10-01 DIAGNOSIS — Z992 Dependence on renal dialysis: Secondary | ICD-10-CM | POA: Diagnosis not present

## 2022-10-04 DIAGNOSIS — N186 End stage renal disease: Secondary | ICD-10-CM | POA: Diagnosis not present

## 2022-10-04 DIAGNOSIS — N2581 Secondary hyperparathyroidism of renal origin: Secondary | ICD-10-CM | POA: Diagnosis not present

## 2022-10-04 DIAGNOSIS — Z992 Dependence on renal dialysis: Secondary | ICD-10-CM | POA: Diagnosis not present

## 2022-10-05 ENCOUNTER — Ambulatory Visit: Payer: Self-pay

## 2022-10-05 NOTE — Patient Outreach (Signed)
Care Coordination   Follow Up Visit Note   10/05/2022 Name: ZYKIRA MATLACK MRN: [id number] DOB: Jun 09, 1961  Terry Arroyo is a 61 y.o. year old female who sees Kirk Ruths, MD for primary care. I spoke with  [name] by phone today.  What matters to the patients health and wellness today?  Recent ED visit for a pulled muscle when moving a mattress. Also eating healthy    Goals Addressed               This Visit's Progress     RNCM: "I am having a hard time sleeping" (pt-stated)        Care Coordination Interventions: Evaluation of current treatment plan related to insomnia and patient's adherence to plan as established by provider Advised patient to not take more than prescribed medications for sleep and the RNCM would contact the provider for recommendations Provided education to patient re: how to enhance sleep habits, not watching tv before bedtime, not eating late at night. The patient states she has dialysis therefore she does not stay up late because on dialysis days she is tired. Reviewed medications with patient and discussed The patient states that she takes Remeron 30 mg at bedtime but it is not helping her rest. She ask if the medications could be increased. Education on the max dose being 45 mg and the provider would need to be contacted. Patient has an appointment with the pcp on 10-12-2022.  Provided patient with mindfulness  educational materials related to relaxation Discussed plans with patient for ongoing care management follow up and provided patient with direct contact information for care management team Advised patient to discuss insomnia and concerns with not getting adequate rest with provider         RNCM: "I moved a mattress by myself and pulled a muscle" (pt-stated)        Care Coordination Interventions: Rates her pain level in her back at a 6 today on a scale of 0-10 Reviewed provider established plan for pain management. The patient  went to the ED recently due to moving a mattress and pulling a muscle in her back. She states that it is feeling better each day but says it is still hurting.  Discussed importance of adherence to all scheduled medical appointments. Appointment scheduled for 10-12-2022 with the pcp for follow up post ED visit  Counseled on the importance of reporting any/all new or changed pain symptoms or management strategies to pain management provider Advised patient to report to care team affect of pain on daily activities Discussed use of relaxation techniques and/or diversional activities to assist with pain reduction (distraction, imagery, relaxation, massage, acupressure, TENS, heat, and cold application. The patient states that heat application helps. Education provided on RICE therapy and the use of ice application to see if this will help. Education provided about using ice for 20 minutes to the area. Will add information in the AVS and have it sent to the patient by mail. Education and support given.  Reviewed with patient prescribed pharmacological and nonpharmacological pain relief strategies. Patient has pain pills but states she will likely run out of them before she sees the pcp. Education and support given.  Advised patient to discuss unresolved pain, changes in level and intensity of pain with provider         RNCM:" I need help with my Diabetes" (pt-stated)        Care Coordination Interventions:  Lab Results  Component  Value Date   HGBA1C 8.0 (H) 09/06/2021   Last A1C from pcp office 7 days ago: 07-01-2022 was 8.3  Education provided on the goal of A1C is less that 7.0 Provided education to patient about basic DM disease process.Education provided on a heart healthy/ADA renal diet. The patient will be sent information in mail on planning healthy meals and information in her AVS that will be printed off and mailed to the patient.  Reviewed medications with patient and discussed importance of  medication adherence. Patient is taking medications as directed.  Counseled on importance of regular laboratory monitoring as prescribed.  Discussed plans with patient for ongoing care management follow up and provided patient with direct contact information for care management team. Provided the patient with the RNCM number. Review of how to reach the [facility] and the patient took the [facility] number down again to have on hand.   Provided patient with written educational materials related to hypo and hyperglycemia and importance of correct treatment. The patient states her blood sugars have been around 90 to 95 and highest she has seen is 101.  She admits she has not been eating good and sometimes her blood sugars drop to 50's. Education on eating small more frequent meals and making sure she has snacks with her when she is at dialysis. 170's to 180's.  Education and support given.  Reviewed scheduled/upcoming provider appointments including: 10-12-2022 with pcp post ED visit Advised patient, providing education and rationale, to check [facility] TID and record, calling pcp or endocrinologist  for findings outside established parameters. The patient states usually her fasting blood sugars are around 90 to 101,  Education provided on the goals of fasting blood sugars of <130 and post prandial of <180.  Review of patient status, including review of consultants reports, relevant laboratory and other test results, and medications completed Screening for signs and symptoms of depression related to chronic disease state  Assessed social determinant of health barriers           SDOH assessments and interventions completed:  Yes  SDOH Interventions Today    Flowsheet Row Most Recent Value  SDOH Interventions   Transportation Interventions Intervention Not Indicated  Utilities Interventions Intervention Not Indicated        Care Coordination Interventions Activated:  Yes  Care Coordination Interventions:  Yes,  provided   Follow up plan: Follow up call scheduled for 11-16-2022 at 1115 am    Encounter Outcome:  Pt. Visit Completed   Noreene Larsson RN, MSN, [place]  Mobile: [phone]

## 2022-10-05 NOTE — Patient Instructions (Signed)
Visit Information  Thank you for taking time to visit with me today. Please don't hesitate to contact me if I can be of assistance to you.   Following are the goals we discussed today:   Goals Addressed               This Visit's Progress     RNCM: "I am having a hard time sleeping" (pt-stated)        Care Coordination Interventions: Evaluation of current treatment plan related to insomnia and patient's adherence to plan as established by provider Advised patient to not take more than prescribed medications for sleep and the RNCM would contact the provider for recommendations Provided education to patient re: how to enhance sleep habits, not watching tv before bedtime, not eating late at night. The patient states she has dialysis therefore she does not stay up late because on dialysis days she is tired. Reviewed medications with patient and discussed The patient states that she takes Remeron 30 mg at bedtime but it is not helping her rest. She ask if the medications could be increased. Education on the max dose being 45 mg and the provider would need to be contacted. Patient has an appointment with the pcp on 10-12-2022.  Provided patient with mindfulness  educational materials related to relaxation Discussed plans with patient for ongoing care management follow up and provided patient with direct contact information for care management team Advised patient to discuss insomnia and concerns with not getting adequate rest with provider         RNCM: "I moved a mattress by myself and pulled a muscle" (pt-stated)        Care Coordination Interventions: Rates her pain level in her back at a 6 today on a scale of 0-10 Reviewed provider established plan for pain management. The patient went to the ED recently due to moving a mattress and pulling a muscle in her back. She states that it is feeling better each day but says it is still hurting.  Discussed importance of adherence to all scheduled  medical appointments. Appointment scheduled for 10-12-2022 with the pcp for follow up post ED visit  Counseled on the importance of reporting any/all new or changed pain symptoms or management strategies to pain management provider Advised patient to report to care team affect of pain on daily activities Discussed use of relaxation techniques and/or diversional activities to assist with pain reduction (distraction, imagery, relaxation, massage, acupressure, TENS, heat, and cold application. The patient states that heat application helps. Education provided on RICE therapy and the use of ice application to see if this will help. Education provided about using ice for 20 minutes to the area. Will add information in the AVS and have it sent to the patient by mail. Education and support given.  Reviewed with patient prescribed pharmacological and nonpharmacological pain relief strategies. Patient has pain pills but states she will likely run out of them before she sees the pcp. Education and support given.  Advised patient to discuss unresolved pain, changes in level and intensity of pain with provider         RNCM:" I need help with my Diabetes" (pt-stated)        Care Coordination Interventions:  Lab Results  Component Value Date   HGBA1C 8.0 (H) 09/06/2021   Last A1C from pcp office 7 days ago: 07-01-2022 was 8.3  Education provided on the goal of A1C is less that 7.0 Provided education to patient about basic  DM disease process.Education provided on a heart healthy/ADA renal diet. The patient will be sent information in mail on planning healthy meals and information in her AVS that will be printed off and mailed to the patient.  Reviewed medications with patient and discussed importance of medication adherence. Patient is taking medications as directed.  Counseled on importance of regular laboratory monitoring as prescribed.  Discussed plans with patient for ongoing care management follow up and  provided patient with direct contact information for care management team. Provided the patient with the RNCM number. Review of how to reach the [facility] and the patient took the [facility] number down again to have on hand.   Provided patient with written educational materials related to hypo and hyperglycemia and importance of correct treatment. The patient states her blood sugars have been around 90 to 95 and highest she has seen is 101.  She admits she has not been eating good and sometimes her blood sugars drop to 50's. Education on eating small more frequent meals and making sure she has snacks with her when she is at dialysis. 170's to 180's.  Education and support given.  Reviewed scheduled/upcoming provider appointments including: 10-12-2022 with pcp post ED visit Advised patient, providing education and rationale, to check cbg TID and record, calling pcp or endocrinologist  for findings outside established parameters. The patient states usually her fasting blood sugars are around 90 to 101,  Education provided on the goals of fasting blood sugars of <130 and post prandial of <180.  Review of patient status, including review of consultants reports, relevant laboratory and other test results, and medications completed Screening for signs and symptoms of depression related to chronic disease state  Assessed social determinant of health barriers           Our next appointment is by telephone on 11-16-2022 at 1115 am   Please call the care guide team at [phone] if you need to cancel or reschedule your appointment.   If you are experiencing a Mental Health or [place] or need someone to talk to, please call the Suicide and Crisis Lifeline: 988 call the [place] National Suicide Prevention Lifeline: 850-527-0143 or TTY: 480-865-1780 TTY (313)130-6864) to talk to a trained counselor call 1-800-273-TALK (toll free, 24 hour hotline)  The patient verbalized understanding of  instructions, educational materials, and care plan provided today and agreed to receive a mailed copy of patient instructions, educational materials, and care plan.   Telephone follow up appointment with care management team member scheduled for: 11-16-2022 at 54 am  [place], MSN, [place]  Mobile: [phone]      Back Exercises These exercises help to make your trunk and back strong. They also help to keep the lower back flexible. Doing these exercises can help to prevent or lessen pain in your lower back. If you have back pain, try to do these exercises 2-3 times each day or as told by your doctor. As you get better, do the exercises once each day. Repeat the exercises more often as told by your doctor. To stop back pain from coming back, do the exercises once each day, or as told by your doctor. Do exercises exactly as told by your doctor. Stop right away if you feel sudden pain or your pain gets worse. Exercises Single knee to chest Do these steps 3-5 times in a row for each leg: Lie on your back on a firm bed or the floor with  your legs stretched out. Bring one knee to your chest. Grab your knee or thigh with both hands and hold it in place. Pull on your knee until you feel a gentle stretch in your lower back or butt. Keep doing the stretch for 10-30 seconds. Slowly let go of your leg and straighten it. Pelvic tilt Do these steps 5-10 times in a row: Lie on your back on a firm bed or the floor with your legs stretched out. Bend your knees so they point up to the ceiling. Your feet should be flat on the floor. Tighten your lower belly (abdomen) muscles to press your lower back against the floor. This will make your tailbone point up to the ceiling instead of pointing down to your feet or the floor. Stay in this position for 5-10 seconds while you gently tighten your muscles and breathe evenly. Cat-cow Do these steps until your lower back  bends more easily: Get on your hands and knees on a firm bed or the floor. Keep your hands under your shoulders, and keep your knees under your hips. You may put padding under your knees. Let your head hang down toward your chest. Tighten (contract) the muscles in your belly. Point your tailbone toward the floor so your lower back becomes rounded like the back of a cat. Stay in this position for 5 seconds. Slowly lift your head. Let the muscles of your belly relax. Point your tailbone up toward the ceiling so your back forms a sagging arch like the back of a cow. Stay in this position for 5 seconds.  Press-ups Do these steps 5-10 times in a row: Lie on your belly (face-down) on a firm bed or the floor. Place your hands near your head, about shoulder-width apart. While you keep your back relaxed and keep your hips on the floor, slowly straighten your arms to raise the top half of your body and lift your shoulders. Do not use your back muscles. You may change where you place your hands to make yourself more comfortable. Stay in this position for 5 seconds. Keep your back relaxed. Slowly return to lying flat on the floor.  Bridges Do these steps 10 times in a row: Lie on your back on a firm bed or the floor. Bend your knees so they point up to the ceiling. Your feet should be flat on the floor. Your arms should be flat at your sides, next to your body. Tighten your butt muscles and lift your butt off the floor until your waist is almost as high as your knees. If you do not feel the muscles working in your butt and the back of your thighs, slide your feet 1-2 inches (2.5-5 cm) farther away from your butt. Stay in this position for 3-5 seconds. Slowly lower your butt to the floor, and let your butt muscles relax. If this exercise is too easy, try doing it with your arms crossed over your chest. Belly crunches Do these steps 5-10 times in a row: Lie on your back on a firm bed or the floor with  your legs stretched out. Bend your knees so they point up to the ceiling. Your feet should be flat on the floor. Cross your arms over your chest. Tip your chin a little bit toward your chest, but do not bend your neck. Tighten your belly muscles and slowly raise your chest just enough to lift your shoulder blades a tiny bit off the floor. Avoid raising your body higher  than that because it can put too much stress on your lower back. Slowly lower your chest and your head to the floor. Back lifts Do these steps 5-10 times in a row: Lie on your belly (face-down) with your arms at your sides, and rest your forehead on the floor. Tighten the muscles in your legs and your butt. Slowly lift your chest off the floor while you keep your hips on the floor. Keep the back of your head in line with the curve in your back. Look at the floor while you do this. Stay in this position for 3-5 seconds. Slowly lower your chest and your face to the floor. Contact a doctor if: Your back pain gets a lot worse when you do an exercise. Your back pain does not get better within 2 hours after you exercise. If you have any of these problems, stop doing the exercises. Do not do them again unless your doctor says it is okay. Get help right away if: You have sudden, very bad back pain. If this happens, stop doing the exercises. Do not do them again unless your doctor says it is okay. This information is not intended to replace advice given to you by your health care provider. Make sure you discuss any questions you have with your health care provider. Document Revised: 02/18/2021 Document Reviewed: 02/18/2021 Elsevier Patient Education  [place] for Routine Care of Injuries Many injuries can be cared for with rest, ice, compression, and elevation (RICE therapy). This includes: Resting the injured body part. Putting ice on the injury. Putting pressure (compression) on the injury. Raising the  injured part (elevation). Using RICE therapy can help to lessen pain and swelling. Supplies needed: Ice. Plastic bag. Towel. Elastic bandage. Pillow or pillows to raise your injured body part. How to care for your injury with RICE therapy Rest Try to rest the injured part of your body. You can go back to your normal activities when your doctor says it is okay to do them and when you can do them without pain. If you rest the injury too much, it may not heal as well. Some injuries heal better with early movement instead of resting for too long. Ask your doctor if you should do exercises to help your injury get better. Ice  If told, put ice on the injured area. To do this: Put ice in a plastic bag. Place a towel between your skin and the bag. Leave the ice on for 20 minutes, 2-3 times a day. Take off the ice if your skin turns bright red. This is very important. If you cannot feel pain, heat, or cold, you have a greater risk of damage to the area. Do not put ice on your bare skin. Use ice for as many days as your doctor tells you to use it. Compression Put pressure on the injured area. This can be done with an elastic bandage. If this type of bandage has been put on your injury: Follow instructions on the package the bandage came in about how to use it. Do not wrap the bandage too tightly. Wrap the bandage more loosely if part of your body beyond the bandage is blue, swollen, cold, painful, or loses feeling. Take off the bandage and put it on again every 3-4 hours or as told by your doctor. See your doctor if the bandage seems to make your problems worse.  Elevation Raise the injured area above the level of your heart while  you are sitting or lying down. Follow these instructions at home: If your symptoms get worse or last a long time, make a follow-up appointment with your doctor. You may need to have imaging tests, such as X-rays or an MRI. If you have imaging tests, ask how to get  your results when they are ready. Return to your normal activities when your doctor says that it is safe. Keep all follow-up visits. Contact a doctor if: You keep having pain and swelling. Your symptoms get worse. Get help right away if: You have sudden, very bad pain at your injury or lower than your injury. You have redness or more swelling around your injury. You have tingling or numbness at your injury or lower than your injury, and it does not go away when you take off the bandage. Summary Many injuries can be cared for using rest, ice, compression, and elevation (RICE therapy). You can go back to your normal activities when your doctor says it is okay and when you can do them without pain. Put ice on the injured area as told by your doctor. Get help if your symptoms get worse or if you keep having pain and swelling. This information is not intended to replace advice given to you by your health care provider. Make sure you discuss any questions you have with your health care provider. Document Revised: 09/25/2020 Document Reviewed: 09/25/2020 Elsevier Patient Education  [place].

## 2022-10-06 DIAGNOSIS — Z992 Dependence on renal dialysis: Secondary | ICD-10-CM | POA: Diagnosis not present

## 2022-10-06 DIAGNOSIS — N186 End stage renal disease: Secondary | ICD-10-CM | POA: Diagnosis not present

## 2022-10-06 DIAGNOSIS — N2581 Secondary hyperparathyroidism of renal origin: Secondary | ICD-10-CM | POA: Diagnosis not present

## 2022-10-08 DIAGNOSIS — N186 End stage renal disease: Secondary | ICD-10-CM | POA: Diagnosis not present

## 2022-10-08 DIAGNOSIS — N2581 Secondary hyperparathyroidism of renal origin: Secondary | ICD-10-CM | POA: Diagnosis not present

## 2022-10-08 DIAGNOSIS — Z992 Dependence on renal dialysis: Secondary | ICD-10-CM | POA: Diagnosis not present

## 2022-10-11 DIAGNOSIS — N186 End stage renal disease: Secondary | ICD-10-CM | POA: Diagnosis not present

## 2022-10-11 DIAGNOSIS — Z992 Dependence on renal dialysis: Secondary | ICD-10-CM | POA: Diagnosis not present

## 2022-10-11 DIAGNOSIS — N2581 Secondary hyperparathyroidism of renal origin: Secondary | ICD-10-CM | POA: Diagnosis not present

## 2022-10-12 ENCOUNTER — Other Ambulatory Visit: Payer: Self-pay | Admitting: Internal Medicine

## 2022-10-12 ENCOUNTER — Ambulatory Visit
Admission: RE | Admit: 2022-10-12 | Discharge: 2022-10-12 | Disposition: A | Discharge status: Home / Self Care | Payer: Medicare HMO | Source: Ambulatory Visit | Attending: Internal Medicine | Admitting: Internal Medicine

## 2022-10-12 DIAGNOSIS — R600 Localized edema: Secondary | ICD-10-CM

## 2022-10-12 DIAGNOSIS — M79605 Pain in left leg: Secondary | ICD-10-CM

## 2022-10-13 DIAGNOSIS — Z992 Dependence on renal dialysis: Secondary | ICD-10-CM | POA: Diagnosis not present

## 2022-10-13 DIAGNOSIS — N186 End stage renal disease: Secondary | ICD-10-CM | POA: Diagnosis not present

## 2022-10-13 DIAGNOSIS — N2581 Secondary hyperparathyroidism of renal origin: Secondary | ICD-10-CM | POA: Diagnosis not present

## 2022-10-15 DIAGNOSIS — Z992 Dependence on renal dialysis: Secondary | ICD-10-CM | POA: Diagnosis not present

## 2022-10-15 DIAGNOSIS — N186 End stage renal disease: Secondary | ICD-10-CM | POA: Diagnosis not present

## 2022-10-15 DIAGNOSIS — N2581 Secondary hyperparathyroidism of renal origin: Secondary | ICD-10-CM | POA: Diagnosis not present

## 2022-10-18 DIAGNOSIS — N2581 Secondary hyperparathyroidism of renal origin: Secondary | ICD-10-CM | POA: Diagnosis not present

## 2022-10-18 DIAGNOSIS — N186 End stage renal disease: Secondary | ICD-10-CM | POA: Diagnosis not present

## 2022-10-18 DIAGNOSIS — Z992 Dependence on renal dialysis: Secondary | ICD-10-CM | POA: Diagnosis not present

## 2022-10-19 DIAGNOSIS — Z992 Dependence on renal dialysis: Secondary | ICD-10-CM | POA: Diagnosis not present

## 2022-10-19 DIAGNOSIS — N186 End stage renal disease: Secondary | ICD-10-CM | POA: Diagnosis not present

## 2022-10-20 DIAGNOSIS — Z992 Dependence on renal dialysis: Secondary | ICD-10-CM | POA: Diagnosis not present

## 2022-10-20 DIAGNOSIS — N186 End stage renal disease: Secondary | ICD-10-CM | POA: Diagnosis not present

## 2022-10-20 DIAGNOSIS — N2581 Secondary hyperparathyroidism of renal origin: Secondary | ICD-10-CM | POA: Diagnosis not present

## 2022-10-22 DIAGNOSIS — N186 End stage renal disease: Secondary | ICD-10-CM | POA: Diagnosis not present

## 2022-10-22 DIAGNOSIS — N2581 Secondary hyperparathyroidism of renal origin: Secondary | ICD-10-CM | POA: Diagnosis not present

## 2022-10-22 DIAGNOSIS — Z992 Dependence on renal dialysis: Secondary | ICD-10-CM | POA: Diagnosis not present

## 2022-10-25 DIAGNOSIS — Z992 Dependence on renal dialysis: Secondary | ICD-10-CM | POA: Diagnosis not present

## 2022-10-25 DIAGNOSIS — N2581 Secondary hyperparathyroidism of renal origin: Secondary | ICD-10-CM | POA: Diagnosis not present

## 2022-10-25 DIAGNOSIS — N186 End stage renal disease: Secondary | ICD-10-CM | POA: Diagnosis not present

## 2022-10-27 DIAGNOSIS — N2581 Secondary hyperparathyroidism of renal origin: Secondary | ICD-10-CM | POA: Diagnosis not present

## 2022-10-27 DIAGNOSIS — Z992 Dependence on renal dialysis: Secondary | ICD-10-CM | POA: Diagnosis not present

## 2022-10-27 DIAGNOSIS — N186 End stage renal disease: Secondary | ICD-10-CM | POA: Diagnosis not present

## 2022-10-29 DIAGNOSIS — N186 End stage renal disease: Secondary | ICD-10-CM | POA: Diagnosis not present

## 2022-10-29 DIAGNOSIS — Z992 Dependence on renal dialysis: Secondary | ICD-10-CM | POA: Diagnosis not present

## 2022-10-29 DIAGNOSIS — N2581 Secondary hyperparathyroidism of renal origin: Secondary | ICD-10-CM | POA: Diagnosis not present

## 2022-10-30 ENCOUNTER — Emergency Department: Payer: Medicare HMO

## 2022-10-30 ENCOUNTER — Other Ambulatory Visit: Payer: Self-pay

## 2022-10-30 ENCOUNTER — Emergency Department
Admission: EM | Admit: 2022-10-30 | Discharge: 2022-10-30 | Disposition: A | Discharge status: Home / Self Care | Payer: Medicare HMO | Attending: Emergency Medicine | Admitting: Emergency Medicine

## 2022-10-30 DIAGNOSIS — M25572 Pain in left ankle and joints of left foot: Secondary | ICD-10-CM | POA: Diagnosis not present

## 2022-10-30 DIAGNOSIS — R0602 Shortness of breath: Secondary | ICD-10-CM | POA: Insufficient documentation

## 2022-10-30 DIAGNOSIS — R001 Bradycardia, unspecified: Secondary | ICD-10-CM | POA: Insufficient documentation

## 2022-10-30 DIAGNOSIS — I959 Hypotension, unspecified: Secondary | ICD-10-CM | POA: Diagnosis not present

## 2022-10-30 DIAGNOSIS — N186 End stage renal disease: Secondary | ICD-10-CM | POA: Insufficient documentation

## 2022-10-30 DIAGNOSIS — Z992 Dependence on renal dialysis: Secondary | ICD-10-CM | POA: Diagnosis not present

## 2022-10-30 DIAGNOSIS — R609 Edema, unspecified: Secondary | ICD-10-CM | POA: Diagnosis not present

## 2022-10-30 DIAGNOSIS — M79662 Pain in left lower leg: Secondary | ICD-10-CM | POA: Diagnosis not present

## 2022-10-30 DIAGNOSIS — M79605 Pain in left leg: Secondary | ICD-10-CM | POA: Diagnosis not present

## 2022-10-30 DIAGNOSIS — R109 Unspecified abdominal pain: Secondary | ICD-10-CM | POA: Diagnosis not present

## 2022-10-30 DIAGNOSIS — R0689 Other abnormalities of breathing: Secondary | ICD-10-CM | POA: Diagnosis not present

## 2022-10-30 DIAGNOSIS — M7989 Other specified soft tissue disorders: Secondary | ICD-10-CM | POA: Diagnosis not present

## 2022-10-30 LAB — CBC WITH DIFFERENTIAL/PLATELET
Abs Immature Granulocytes: 0.02 10*3/uL (ref 0.00–0.07)
Basophils Absolute: 0.1 10*3/uL (ref 0.0–0.1)
Basophils Relative: 1 %
Eosinophils Absolute: 0.4 10*3/uL (ref 0.0–0.5)
Eosinophils Relative: 5 %
HCT: 38.2 % (ref 36.0–46.0)
Hemoglobin: 11.9 g/dL — ABNORMAL LOW (ref 12.0–15.0)
Immature Granulocytes: 0 %
Lymphocytes Relative: 20 %
Lymphs Abs: 1.5 10*3/uL (ref 0.7–4.0)
MCH: 28.3 pg (ref 26.0–34.0)
MCHC: 31.2 g/dL (ref 30.0–36.0)
MCV: 90.7 fL (ref 80.0–100.0)
Monocytes Absolute: 0.7 10*3/uL (ref 0.1–1.0)
Monocytes Relative: 9 %
Neutro Abs: 5 10*3/uL (ref 1.7–7.7)
Neutrophils Relative %: 65 %
Platelets: 209 10*3/uL (ref 150–400)
RBC: 4.21 MIL/uL (ref 3.87–5.11)
RDW: 15.1 % (ref 11.5–15.5)
WBC: 7.6 10*3/uL (ref 4.0–10.5)
nRBC: 0 % (ref 0.0–0.2)

## 2022-10-30 LAB — TROPONIN I (HIGH SENSITIVITY): Troponin I (High Sensitivity): 28 ng/L — ABNORMAL HIGH (ref <18)

## 2022-10-30 LAB — BASIC METABOLIC PANEL
Anion gap: 10 (ref 5–15)
BUN: 21 mg/dL — ABNORMAL HIGH (ref 6–20)
CO2: 21 mmol/L — ABNORMAL LOW (ref 22–32)
Calcium: 9.1 mg/dL (ref 8.9–10.3)
Chloride: 103 mmol/L (ref 98–111)
Creatinine, Ser: 5.71 mg/dL — ABNORMAL HIGH (ref 0.44–1.00)
GFR, Estimated: 8 mL/min — ABNORMAL LOW (ref >60)
Glucose, Bld: 117 mg/dL — ABNORMAL HIGH (ref 70–99)
Potassium: 3.7 mmol/L (ref 3.5–5.1)
Sodium: 134 mmol/L — ABNORMAL LOW (ref 135–145)

## 2022-10-30 LAB — BRAIN NATRIURETIC PEPTIDE: B Natriuretic Peptide: 2774.2 pg/mL — ABNORMAL HIGH (ref 0.0–100.0)

## 2022-10-30 MED ORDER — HYDROCODONE-ACETAMINOPHEN 5-325 MG PO TABS
1.0000 | ORAL_TABLET | Freq: Once | ORAL | Status: AC
  Administered 2022-10-30: 1 via ORAL
  Filled 2022-10-30: qty 1

## 2022-10-30 MED ORDER — HYDROCODONE-ACETAMINOPHEN 5-325 MG PO TABS: 1.0000 | ORAL_TABLET | ORAL | 0 refills | Status: DC | PRN

## 2022-10-30 NOTE — ED Notes (Signed)
This RN attempted blood draw twice. Unsuccessful. Will call lab. Pt taken to [place].

## 2022-10-30 NOTE — ED Notes (Signed)
FIRST NURSE: Pt to ER via EMS with report of left leg swelling for last 2 weeks.  Reports it is getting worse.  Hx of cellulitis to same several months ago.  61, 97%, 141/52 CBG 115 98.7

## 2022-10-30 NOTE — ED Triage Notes (Signed)
Pt BIB EMS for left lower leg pain. Pt denies any CP and SOB. Pt denies HX of blood clot. Pt has mild swelling and tenderness to the left calf. Pt is a dialysis patient and received all of her treatments this week. Leg pain has been going on x 2 weeks and she couldn't sleep last night so she came in today. Pt is in no acute distress. Pt told dialysis nurse on Friday she wanted to go to ER and staff at dialysis clinic on [address] refused to call EMS for her.

## 2022-10-30 NOTE — ED Provider Notes (Signed)
[facility] Provider Note   Event Date/Time   First MD Initiated Contact with Patient 10/30/22 2157     (approximate) History  Leg Pain (left)  HPI Terry Arroyo is a 61 y.o. female with a stated past medical history of end-stage renal disease on dialysis who presents complaining of left lateral ankle pain has been present over the last 3 days.  Patient states that this pain is worse with any palpation or ambulation.  Patient is concerned as she states "I have had cellulitis that started like this in the past".  Patient denies any fevers, erythema around the area, induration, or areas of fluctuance.  Denies any recent trauma ROS: Patient currently denies any vision changes, tinnitus, difficulty speaking, facial droop, sore throat, chest pain, shortness of breath, abdominal pain, nausea/vomiting/diarrhea, dysuria, or weakness/numbness/paresthesias in any extremity   Physical Exam  Triage Vital Signs: ED Triage Vitals  Enc Vitals Group     BP 10/30/22 1837 (!) 142/63     Pulse Rate 10/30/22 1837 (!) 57     Resp 10/30/22 1837 16     Temp 10/30/22 1837 98 F (36.7 C)     Temp Source 10/30/22 1837 Oral     SpO2 10/30/22 1837 98 %     Weight 10/30/22 1838 178 lb (80.7 kg)     Height 10/30/22 1838 '5\' 6"'$  (1.676 m)     Head Circumference --      Peak Flow --      Pain Score 10/30/22 1837 8     Pain Loc --      Pain Edu? --      Excl. in [place]? --    Most recent vital signs: Vitals:   10/30/22 2245 10/30/22 2246  BP:  (!) 148/63  Pulse:  (!) 55  Resp:  17  Temp: 97.9 F (36.6 C)   SpO2:  100%   General: Awake, oriented x4. CV:  Good peripheral perfusion.  Resp:  Normal effort.  Abd:  No distention.  Other:  Middle-aged overweight African-American female laying in bed in mild distress secondary to pain.  Mild edema overlying the left lateral malleolus with tenderness to palpation over the ATFL ED Results / Procedures / Treatments  Labs (all labs ordered  are listed, but only abnormal results are displayed) Labs Reviewed  BRAIN NATRIURETIC PEPTIDE - Abnormal; Notable for the following components:      Result Value   B Natriuretic Peptide 2,774.2 (*)    All other components within normal limits  BASIC METABOLIC PANEL - Abnormal; Notable for the following components:   Sodium 134 (*)    CO2 21 (*)    Glucose, Bld 117 (*)    BUN 21 (*)    Creatinine, Ser 5.71 (*)    GFR, Estimated 8 (*)    All other components within normal limits  CBC WITH DIFFERENTIAL/PLATELET - Abnormal; Notable for the following components:   Hemoglobin 11.9 (*)    All other components within normal limits  TROPONIN I (HIGH SENSITIVITY) - Abnormal; Notable for the following components:   Troponin I (High Sensitivity) 28 (*)    All other components within normal limits  TROPONIN I (HIGH SENSITIVITY)  RADIOLOGY ED MD interpretation: Doppler ultrasound of the left lower extremity interpreted by me and negative for any acute abnormalities including negative for any DVT -Agree with radiology assessment Official radiology report(s): US Venous Img Lower  Left (DVT Study)  Result Date: 10/30/2022 CLINICAL DATA:  Swelling, pain x1 month EXAM: LEFT LOWER EXTREMITY VENOUS DOPPLER ULTRASOUND TECHNIQUE: Gray-scale sonography with compression, as well as color and duplex ultrasound, were performed to evaluate the deep venous system(s) from the level of the common femoral vein through the popliteal and proximal calf veins. COMPARISON:  10/12/2022 FINDINGS: VENOUS Normal compressibility of the common femoral, superficial femoral, and popliteal veins, as well as the visualized calf veins. Visualized portions of profunda femoral vein and great saphenous vein unremarkable. No filling defects to suggest DVT on grayscale or color Doppler imaging. Doppler waveforms show normal direction of venous flow, normal respiratory plasticity and response to augmentation. Limited views of the  contralateral common femoral vein are unremarkable. OTHER None. Limitations: none IMPRESSION: Negative. Electronically Signed   By: Lucrezia Europe M.D.   On: 10/30/2022 19:26   PROCEDURES: Critical Care performed: No .1-3 Lead EKG Interpretation  Performed by: Naaman Plummer, MD Authorized by: Naaman Plummer, MD     Interpretation: abnormal     ECG rate:  56   ECG rate assessment: bradycardic     Rhythm: sinus bradycardia     Ectopy: none     Conduction: normal    MEDICATIONS ORDERED IN ED: Medications  HYDROcodone-acetaminophen (NORCO/VICODIN) 5-325 MG per tablet 1 tablet (1 tablet Oral Given 10/30/22 2245)   IMPRESSION / MDM / ASSESSMENT AND PLAN / ED COURSE  I reviewed the triage vital signs and the nursing notes.                             The patient is on the cardiac monitor to evaluate for evidence of arrhythmia and/or significant heart rate changes. Patient's presentation is most consistent with acute presentation with potential threat to life or bodily function. 36-year-old female presents for left lateral ankle pain that is been present over the last 3 weeks Given history, exam and workup I have low suspicion for fracture, dislocation, significant ligamentous injury, septic arthritis, gout flare, new autoimmune arthropathy, or gonococcal arthropathy.  Interventions: Ace wrap and crutches as well as orthopedic follow-up as needed Disposition: Discharge home with strict return precautions and instructions for prompt primary care follow up in the next week.   FINAL CLINICAL IMPRESSION(S) / ED DIAGNOSES   Final diagnoses:  Acute left ankle pain   Rx / DC Orders   ED Discharge Orders          Ordered    HYDROcodone-acetaminophen (NORCO) 5-325 MG tablet  Every 4 hours PRN        10/30/22 2228           Note:  This document was prepared using Dragon voice recognition software and may include unintentional dictation errors.   Naaman Plummer, MD 10/30/22 [phone]

## 2022-10-30 NOTE — ED Provider Triage Note (Signed)
Emergency Medicine Provider Triage Evaluation Note  Terry Arroyo , a 61 y.o. female  was evaluated in triage.  Pt complains of left leg pain and swelling. No fevers. No chest pain. No history of PE/DVT. Mild SOB   Review of Systems  Positive: Leg pain and swelling Negative: CP, fever  Physical Exam  There were no vitals taken for this visit. Gen:   Awake, no distress   Resp:  Normal effort MSK:   Moves extremities without difficulty  Other:    Medical Decision Making  Medically screening exam initiated at 6:33 PM.  Appropriate orders placed.  Terry Arroyo was informed that the remainder of the evaluation will be completed by another provider, this initial triage assessment does not replace that evaluation, and the importance of remaining in the ED until their evaluation is complete.     Marquette Old, PA-C 10/30/22 1836

## 2022-11-01 DIAGNOSIS — Z992 Dependence on renal dialysis: Secondary | ICD-10-CM | POA: Diagnosis not present

## 2022-11-01 DIAGNOSIS — N2581 Secondary hyperparathyroidism of renal origin: Secondary | ICD-10-CM | POA: Diagnosis not present

## 2022-11-01 DIAGNOSIS — N186 End stage renal disease: Secondary | ICD-10-CM | POA: Diagnosis not present

## 2022-11-03 DIAGNOSIS — N2581 Secondary hyperparathyroidism of renal origin: Secondary | ICD-10-CM | POA: Diagnosis not present

## 2022-11-03 DIAGNOSIS — Z992 Dependence on renal dialysis: Secondary | ICD-10-CM | POA: Diagnosis not present

## 2022-11-03 DIAGNOSIS — N186 End stage renal disease: Secondary | ICD-10-CM | POA: Diagnosis not present

## 2022-11-04 DIAGNOSIS — M76822 Posterior tibial tendinitis, left leg: Secondary | ICD-10-CM | POA: Diagnosis not present

## 2022-11-04 DIAGNOSIS — M7662 Achilles tendinitis, left leg: Secondary | ICD-10-CM | POA: Diagnosis not present

## 2022-11-04 DIAGNOSIS — M10072 Idiopathic gout, left ankle and foot: Secondary | ICD-10-CM | POA: Diagnosis not present

## 2022-11-04 DIAGNOSIS — M7672 Peroneal tendinitis, left leg: Secondary | ICD-10-CM | POA: Diagnosis not present

## 2022-11-04 DIAGNOSIS — Z794 Long term (current) use of insulin: Secondary | ICD-10-CM | POA: Diagnosis not present

## 2022-11-04 DIAGNOSIS — E1140 Type 2 diabetes mellitus with diabetic neuropathy, unspecified: Secondary | ICD-10-CM | POA: Diagnosis not present

## 2022-11-04 DIAGNOSIS — S86012A Strain of left Achilles tendon, initial encounter: Secondary | ICD-10-CM | POA: Diagnosis not present

## 2022-11-04 DIAGNOSIS — M79672 Pain in left foot: Secondary | ICD-10-CM | POA: Diagnosis not present

## 2022-11-05 ENCOUNTER — Telehealth: Payer: Self-pay

## 2022-11-05 DIAGNOSIS — N2581 Secondary hyperparathyroidism of renal origin: Secondary | ICD-10-CM | POA: Diagnosis not present

## 2022-11-05 DIAGNOSIS — N186 End stage renal disease: Secondary | ICD-10-CM | POA: Diagnosis not present

## 2022-11-05 DIAGNOSIS — Z992 Dependence on renal dialysis: Secondary | ICD-10-CM | POA: Diagnosis not present

## 2022-11-05 NOTE — Telephone Encounter (Signed)
        Patient  visited [facility] on 10/30/2022  for Pain in left ankle and joints of left foot.   Telephone encounter attempt :  1st  Unable to leave message number not in service.   [place] Resource Care Guide   [email or web address]  ?? [phone]   Website: [email or web address]  [email or web address]

## 2022-11-08 ENCOUNTER — Telehealth: Payer: Self-pay

## 2022-11-08 DIAGNOSIS — Z992 Dependence on renal dialysis: Secondary | ICD-10-CM | POA: Diagnosis not present

## 2022-11-08 DIAGNOSIS — N186 End stage renal disease: Secondary | ICD-10-CM | POA: Diagnosis not present

## 2022-11-08 DIAGNOSIS — N2581 Secondary hyperparathyroidism of renal origin: Secondary | ICD-10-CM | POA: Diagnosis not present

## 2022-11-08 NOTE — Telephone Encounter (Signed)
        Patient  visited [facility] on 10/30/2022  for Pain in left ankle and joints of left foot.   Telephone encounter attempt :  2nd  A HIPAA compliant voice message was left requesting a return call.  Instructed patient to call back at [phone].   [place] Resource Care Guide   [email or web address]  ?? [phone]   Website: [email or web address]  [email or web address]

## 2022-11-09 ENCOUNTER — Telehealth: Payer: Self-pay

## 2022-11-09 NOTE — Telephone Encounter (Signed)
     Patient  visit on 10/30/2022  at [facility] was for Pain in left ankle and joints of left foot.  Have you been able to follow up with your primary care physician?Yes  The patient was or was not able to obtain any needed medicine or equipment. Patient was able to obtain medication.  Are there diet recommendations that you are having difficulty following? No  Patient expresses understanding of discharge instructions and education provided has no other needs at this time.    [place] Resource Care Guide   [email or web address]  ?? [phone]   Website: [email or web address]  [email or web address]

## 2022-11-10 DIAGNOSIS — N2581 Secondary hyperparathyroidism of renal origin: Secondary | ICD-10-CM | POA: Diagnosis not present

## 2022-11-10 DIAGNOSIS — N186 End stage renal disease: Secondary | ICD-10-CM | POA: Diagnosis not present

## 2022-11-10 DIAGNOSIS — Z992 Dependence on renal dialysis: Secondary | ICD-10-CM | POA: Diagnosis not present

## 2022-11-12 DIAGNOSIS — Z992 Dependence on renal dialysis: Secondary | ICD-10-CM | POA: Diagnosis not present

## 2022-11-12 DIAGNOSIS — N2581 Secondary hyperparathyroidism of renal origin: Secondary | ICD-10-CM | POA: Diagnosis not present

## 2022-11-12 DIAGNOSIS — N186 End stage renal disease: Secondary | ICD-10-CM | POA: Diagnosis not present

## 2022-11-15 DIAGNOSIS — Z992 Dependence on renal dialysis: Secondary | ICD-10-CM | POA: Diagnosis not present

## 2022-11-15 DIAGNOSIS — N2581 Secondary hyperparathyroidism of renal origin: Secondary | ICD-10-CM | POA: Diagnosis not present

## 2022-11-15 DIAGNOSIS — N186 End stage renal disease: Secondary | ICD-10-CM | POA: Diagnosis not present

## 2022-11-16 ENCOUNTER — Ambulatory Visit: Payer: Self-pay | Admitting: *Deleted

## 2022-11-16 NOTE — Patient Outreach (Signed)
Care Coordination   Follow Up Visit Note   11/16/2022 Name: Terry Arroyo MRN: [id number] DOB: 06/23/1961  Terry Arroyo is a 61 y.o. year old female who sees Kirk Ruths, MD for primary care. I spoke with  Terry Arroyo by phone today.  What matters to the patients health and wellness today?  Sprain her ankle on 11/10, was seen in the ED, now has a boot.  No fracture reported.     Goals Addressed               This Visit's Progress     COMPLETED: RNCM: "I moved a mattress by myself and pulled a muscle" (pt-stated)   On track     Care Coordination Interventions: Rates her pain level in her back at a 6 today on a scale of 0-10 Reviewed provider established plan for pain management. The patient went to the ED recently due to moving a mattress and pulling a muscle in her back. She states that it is feeling better each day but says it is still hurting.  Discussed importance of adherence to all scheduled medical appointments. Appointment scheduled for 10-12-2022 with the pcp for follow up post ED visit  Counseled on the importance of reporting any/all new or changed pain symptoms or management strategies to pain management provider Advised patient to report to care team affect of pain on daily activities Discussed use of relaxation techniques and/or diversional activities to assist with pain reduction (distraction, imagery, relaxation, massage, acupressure, TENS, heat, and cold application. The patient states that heat application helps. Education provided on RICE therapy and the use of ice application to see if this will help. Education provided about using ice for 20 minutes to the area. Will add information in the AVS and have it sent to the patient by mail. Education and support given.  Reviewed with patient prescribed pharmacological and nonpharmacological pain relief strategies. Patient has pain pills but states she will likely run out of them before she sees the pcp.  Education and support given.  Advised patient to discuss unresolved pain, changes in level and intensity of pain with provider   Update 11/28 - denies this is still an issue, problem resolved       RNCM:" I need help with my Diabetes" (pt-stated)   On track     Care Coordination Interventions:  Lab Results  Component Value Date   HGBA1C 8.0 (H) 09/06/2021   Last A1C from pcp office 7 days ago: 07-01-2022 was 8.3  Education provided on the goal of A1C is less that 7.0 Provided education to patient about basic DM disease process.Education provided on a heart healthy/ADA renal diet.  Reviewed medications with patient and discussed importance of medication adherence. Patient is taking medications as directed.  Counseled on importance of regular laboratory monitoring as prescribed.  Discussed plans with patient for ongoing care management follow up and provided patient with direct contact information for care management team. Provided the patient with the RNCM number. Review of how to reach the [facility] and the patient took the [facility] number down again to have on hand.   Provided patient with written educational materials related to hypo and hyperglycemia and importance of correct treatment. Education and support given.  Reviewed scheduled/upcoming provider appointments including: advised to call PCP office to inquire about follow up for foot/ankle sprain.  Was seen on 11/13 for ED follow up. Advised patient, providing education and rationale, to check cbg TID and record, calling  pcp or endocrinologist  for findings outside established parameters.  Education provided on the goals of fasting blood sugars of <130 and post prandial of <180.  Review of patient status, including review of consultants reports, relevant laboratory and other test results, and medications completed           SDOH assessments and interventions completed:  No     Care Coordination Interventions:  Yes, provided   Follow  up plan: Follow up call scheduled for 1/23    Encounter Outcome:  Pt. Visit Completed   Valente David, RN, MSN, [place] Care Management Care Management Coordinator [phone]

## 2022-11-17 DIAGNOSIS — Z992 Dependence on renal dialysis: Secondary | ICD-10-CM | POA: Diagnosis not present

## 2022-11-17 DIAGNOSIS — N2581 Secondary hyperparathyroidism of renal origin: Secondary | ICD-10-CM | POA: Diagnosis not present

## 2022-11-17 DIAGNOSIS — N186 End stage renal disease: Secondary | ICD-10-CM | POA: Diagnosis not present

## 2022-11-18 DIAGNOSIS — N186 End stage renal disease: Secondary | ICD-10-CM | POA: Diagnosis not present

## 2022-11-18 DIAGNOSIS — Z992 Dependence on renal dialysis: Secondary | ICD-10-CM | POA: Diagnosis not present

## 2022-11-19 DIAGNOSIS — Z992 Dependence on renal dialysis: Secondary | ICD-10-CM | POA: Diagnosis not present

## 2022-11-19 DIAGNOSIS — N186 End stage renal disease: Secondary | ICD-10-CM | POA: Diagnosis not present

## 2022-11-19 DIAGNOSIS — N2581 Secondary hyperparathyroidism of renal origin: Secondary | ICD-10-CM | POA: Diagnosis not present

## 2022-11-22 DIAGNOSIS — N2581 Secondary hyperparathyroidism of renal origin: Secondary | ICD-10-CM | POA: Diagnosis not present

## 2022-11-22 DIAGNOSIS — N186 End stage renal disease: Secondary | ICD-10-CM | POA: Diagnosis not present

## 2022-11-22 DIAGNOSIS — Z992 Dependence on renal dialysis: Secondary | ICD-10-CM | POA: Diagnosis not present

## 2022-11-24 DIAGNOSIS — N2581 Secondary hyperparathyroidism of renal origin: Secondary | ICD-10-CM | POA: Diagnosis not present

## 2022-11-24 DIAGNOSIS — Z992 Dependence on renal dialysis: Secondary | ICD-10-CM | POA: Diagnosis not present

## 2022-11-24 DIAGNOSIS — N186 End stage renal disease: Secondary | ICD-10-CM | POA: Diagnosis not present

## 2022-11-26 DIAGNOSIS — Z992 Dependence on renal dialysis: Secondary | ICD-10-CM | POA: Diagnosis not present

## 2022-11-26 DIAGNOSIS — N2581 Secondary hyperparathyroidism of renal origin: Secondary | ICD-10-CM | POA: Diagnosis not present

## 2022-11-26 DIAGNOSIS — N186 End stage renal disease: Secondary | ICD-10-CM | POA: Diagnosis not present

## 2022-11-29 DIAGNOSIS — N2581 Secondary hyperparathyroidism of renal origin: Secondary | ICD-10-CM | POA: Diagnosis not present

## 2022-11-29 DIAGNOSIS — Z992 Dependence on renal dialysis: Secondary | ICD-10-CM | POA: Diagnosis not present

## 2022-11-29 DIAGNOSIS — N186 End stage renal disease: Secondary | ICD-10-CM | POA: Diagnosis not present

## 2022-12-01 DIAGNOSIS — N2581 Secondary hyperparathyroidism of renal origin: Secondary | ICD-10-CM | POA: Diagnosis not present

## 2022-12-01 DIAGNOSIS — N186 End stage renal disease: Secondary | ICD-10-CM | POA: Diagnosis not present

## 2022-12-01 DIAGNOSIS — Z992 Dependence on renal dialysis: Secondary | ICD-10-CM | POA: Diagnosis not present

## 2022-12-03 DIAGNOSIS — N2581 Secondary hyperparathyroidism of renal origin: Secondary | ICD-10-CM | POA: Diagnosis not present

## 2022-12-03 DIAGNOSIS — N186 End stage renal disease: Secondary | ICD-10-CM | POA: Diagnosis not present

## 2022-12-03 DIAGNOSIS — Z992 Dependence on renal dialysis: Secondary | ICD-10-CM | POA: Diagnosis not present

## 2022-12-06 DIAGNOSIS — N186 End stage renal disease: Secondary | ICD-10-CM | POA: Diagnosis not present

## 2022-12-06 DIAGNOSIS — Z992 Dependence on renal dialysis: Secondary | ICD-10-CM | POA: Diagnosis not present

## 2022-12-06 DIAGNOSIS — N2581 Secondary hyperparathyroidism of renal origin: Secondary | ICD-10-CM | POA: Diagnosis not present

## 2022-12-08 DIAGNOSIS — N186 End stage renal disease: Secondary | ICD-10-CM | POA: Diagnosis not present

## 2022-12-08 DIAGNOSIS — Z992 Dependence on renal dialysis: Secondary | ICD-10-CM | POA: Diagnosis not present

## 2022-12-08 DIAGNOSIS — N2581 Secondary hyperparathyroidism of renal origin: Secondary | ICD-10-CM | POA: Diagnosis not present

## 2022-12-10 DIAGNOSIS — Z992 Dependence on renal dialysis: Secondary | ICD-10-CM | POA: Diagnosis not present

## 2022-12-10 DIAGNOSIS — N2581 Secondary hyperparathyroidism of renal origin: Secondary | ICD-10-CM | POA: Diagnosis not present

## 2022-12-10 DIAGNOSIS — N186 End stage renal disease: Secondary | ICD-10-CM | POA: Diagnosis not present

## 2022-12-12 DIAGNOSIS — N2581 Secondary hyperparathyroidism of renal origin: Secondary | ICD-10-CM | POA: Diagnosis not present

## 2022-12-12 DIAGNOSIS — Z992 Dependence on renal dialysis: Secondary | ICD-10-CM | POA: Diagnosis not present

## 2022-12-12 DIAGNOSIS — N186 End stage renal disease: Secondary | ICD-10-CM | POA: Diagnosis not present

## 2022-12-15 DIAGNOSIS — N186 End stage renal disease: Secondary | ICD-10-CM | POA: Diagnosis not present

## 2022-12-15 DIAGNOSIS — Z992 Dependence on renal dialysis: Secondary | ICD-10-CM | POA: Diagnosis not present

## 2022-12-15 DIAGNOSIS — N2581 Secondary hyperparathyroidism of renal origin: Secondary | ICD-10-CM | POA: Diagnosis not present

## 2022-12-16 DIAGNOSIS — Z992 Dependence on renal dialysis: Secondary | ICD-10-CM | POA: Diagnosis not present

## 2022-12-16 DIAGNOSIS — I700 Atherosclerosis of aorta: Secondary | ICD-10-CM | POA: Diagnosis not present

## 2022-12-16 DIAGNOSIS — J4489 Other specified chronic obstructive pulmonary disease: Secondary | ICD-10-CM | POA: Diagnosis not present

## 2022-12-16 DIAGNOSIS — I120 Hypertensive chronic kidney disease with stage 5 chronic kidney disease or end stage renal disease: Secondary | ICD-10-CM | POA: Diagnosis not present

## 2022-12-16 DIAGNOSIS — N186 End stage renal disease: Secondary | ICD-10-CM | POA: Diagnosis not present

## 2022-12-16 DIAGNOSIS — E1122 Type 2 diabetes mellitus with diabetic chronic kidney disease: Secondary | ICD-10-CM | POA: Diagnosis not present

## 2022-12-16 DIAGNOSIS — Z794 Long term (current) use of insulin: Secondary | ICD-10-CM | POA: Diagnosis not present

## 2022-12-16 DIAGNOSIS — I25118 Atherosclerotic heart disease of native coronary artery with other forms of angina pectoris: Secondary | ICD-10-CM | POA: Diagnosis not present

## 2022-12-16 DIAGNOSIS — F3340 Major depressive disorder, recurrent, in remission, unspecified: Secondary | ICD-10-CM | POA: Diagnosis not present

## 2022-12-17 DIAGNOSIS — N186 End stage renal disease: Secondary | ICD-10-CM | POA: Diagnosis not present

## 2022-12-17 DIAGNOSIS — N2581 Secondary hyperparathyroidism of renal origin: Secondary | ICD-10-CM | POA: Diagnosis not present

## 2022-12-17 DIAGNOSIS — Z992 Dependence on renal dialysis: Secondary | ICD-10-CM | POA: Diagnosis not present

## 2022-12-19 DIAGNOSIS — N2581 Secondary hyperparathyroidism of renal origin: Secondary | ICD-10-CM | POA: Diagnosis not present

## 2022-12-19 DIAGNOSIS — Z992 Dependence on renal dialysis: Secondary | ICD-10-CM | POA: Diagnosis not present

## 2022-12-19 DIAGNOSIS — N186 End stage renal disease: Secondary | ICD-10-CM | POA: Diagnosis not present

## 2022-12-22 DIAGNOSIS — Z992 Dependence on renal dialysis: Secondary | ICD-10-CM | POA: Diagnosis not present

## 2022-12-22 DIAGNOSIS — N186 End stage renal disease: Secondary | ICD-10-CM | POA: Diagnosis not present

## 2022-12-22 DIAGNOSIS — N2581 Secondary hyperparathyroidism of renal origin: Secondary | ICD-10-CM | POA: Diagnosis not present

## 2022-12-24 DIAGNOSIS — N186 End stage renal disease: Secondary | ICD-10-CM | POA: Diagnosis not present

## 2022-12-24 DIAGNOSIS — N2581 Secondary hyperparathyroidism of renal origin: Secondary | ICD-10-CM | POA: Diagnosis not present

## 2022-12-24 DIAGNOSIS — Z992 Dependence on renal dialysis: Secondary | ICD-10-CM | POA: Diagnosis not present

## 2022-12-27 DIAGNOSIS — N2581 Secondary hyperparathyroidism of renal origin: Secondary | ICD-10-CM | POA: Diagnosis not present

## 2022-12-27 DIAGNOSIS — Z992 Dependence on renal dialysis: Secondary | ICD-10-CM | POA: Diagnosis not present

## 2022-12-27 DIAGNOSIS — N186 End stage renal disease: Secondary | ICD-10-CM | POA: Diagnosis not present

## 2022-12-29 DIAGNOSIS — N2581 Secondary hyperparathyroidism of renal origin: Secondary | ICD-10-CM | POA: Diagnosis not present

## 2022-12-29 DIAGNOSIS — N186 End stage renal disease: Secondary | ICD-10-CM | POA: Diagnosis not present

## 2022-12-29 DIAGNOSIS — Z992 Dependence on renal dialysis: Secondary | ICD-10-CM | POA: Diagnosis not present

## 2022-12-31 DIAGNOSIS — Z992 Dependence on renal dialysis: Secondary | ICD-10-CM | POA: Diagnosis not present

## 2022-12-31 DIAGNOSIS — N186 End stage renal disease: Secondary | ICD-10-CM | POA: Diagnosis not present

## 2022-12-31 DIAGNOSIS — R32 Unspecified urinary incontinence: Secondary | ICD-10-CM | POA: Diagnosis not present

## 2022-12-31 DIAGNOSIS — N2581 Secondary hyperparathyroidism of renal origin: Secondary | ICD-10-CM | POA: Diagnosis not present

## 2023-01-03 DIAGNOSIS — N186 End stage renal disease: Secondary | ICD-10-CM | POA: Diagnosis not present

## 2023-01-03 DIAGNOSIS — N2581 Secondary hyperparathyroidism of renal origin: Secondary | ICD-10-CM | POA: Diagnosis not present

## 2023-01-03 DIAGNOSIS — Z992 Dependence on renal dialysis: Secondary | ICD-10-CM | POA: Diagnosis not present

## 2023-01-05 DIAGNOSIS — Z992 Dependence on renal dialysis: Secondary | ICD-10-CM | POA: Diagnosis not present

## 2023-01-05 DIAGNOSIS — N186 End stage renal disease: Secondary | ICD-10-CM | POA: Diagnosis not present

## 2023-01-05 DIAGNOSIS — N2581 Secondary hyperparathyroidism of renal origin: Secondary | ICD-10-CM | POA: Diagnosis not present

## 2023-01-05 DIAGNOSIS — E119 Type 2 diabetes mellitus without complications: Secondary | ICD-10-CM | POA: Diagnosis not present

## 2023-01-05 DIAGNOSIS — Z794 Long term (current) use of insulin: Secondary | ICD-10-CM | POA: Diagnosis not present

## 2023-01-07 DIAGNOSIS — N186 End stage renal disease: Secondary | ICD-10-CM | POA: Diagnosis not present

## 2023-01-07 DIAGNOSIS — Z992 Dependence on renal dialysis: Secondary | ICD-10-CM | POA: Diagnosis not present

## 2023-01-07 DIAGNOSIS — N2581 Secondary hyperparathyroidism of renal origin: Secondary | ICD-10-CM | POA: Diagnosis not present

## 2023-01-10 DIAGNOSIS — N2581 Secondary hyperparathyroidism of renal origin: Secondary | ICD-10-CM | POA: Diagnosis not present

## 2023-01-10 DIAGNOSIS — N186 End stage renal disease: Secondary | ICD-10-CM | POA: Diagnosis not present

## 2023-01-10 DIAGNOSIS — Z992 Dependence on renal dialysis: Secondary | ICD-10-CM | POA: Diagnosis not present

## 2023-01-11 ENCOUNTER — Ambulatory Visit: Payer: Self-pay | Admitting: *Deleted

## 2023-01-11 NOTE — Patient Outreach (Signed)
  Care Coordination   01/11/2023 Name: REID NAWROT MRN: [id number] DOB: Jan 02, 1961   Care Coordination Outreach Attempts:  An unsuccessful telephone outreach was attempted for a scheduled appointment today.  Follow Up Plan:  Additional outreach attempts will be made to offer the patient care coordination information and services.   Encounter Outcome:  No Answer   Care Coordination Interventions:  No, not indicated    Valente David, RN, MSN, [facility] Care Management Care Management Coordinator [phone]

## 2023-01-12 DIAGNOSIS — Z992 Dependence on renal dialysis: Secondary | ICD-10-CM | POA: Diagnosis not present

## 2023-01-12 DIAGNOSIS — N2581 Secondary hyperparathyroidism of renal origin: Secondary | ICD-10-CM | POA: Diagnosis not present

## 2023-01-12 DIAGNOSIS — N186 End stage renal disease: Secondary | ICD-10-CM | POA: Diagnosis not present

## 2023-01-14 DIAGNOSIS — Z992 Dependence on renal dialysis: Secondary | ICD-10-CM | POA: Diagnosis not present

## 2023-01-14 DIAGNOSIS — N186 End stage renal disease: Secondary | ICD-10-CM | POA: Diagnosis not present

## 2023-01-14 DIAGNOSIS — N2581 Secondary hyperparathyroidism of renal origin: Secondary | ICD-10-CM | POA: Diagnosis not present

## 2023-01-17 DIAGNOSIS — Z992 Dependence on renal dialysis: Secondary | ICD-10-CM | POA: Diagnosis not present

## 2023-01-17 DIAGNOSIS — N186 End stage renal disease: Secondary | ICD-10-CM | POA: Diagnosis not present

## 2023-01-17 DIAGNOSIS — N2581 Secondary hyperparathyroidism of renal origin: Secondary | ICD-10-CM | POA: Diagnosis not present

## 2023-01-19 DIAGNOSIS — N186 End stage renal disease: Secondary | ICD-10-CM | POA: Diagnosis not present

## 2023-01-19 DIAGNOSIS — N2581 Secondary hyperparathyroidism of renal origin: Secondary | ICD-10-CM | POA: Diagnosis not present

## 2023-01-19 DIAGNOSIS — Z992 Dependence on renal dialysis: Secondary | ICD-10-CM | POA: Diagnosis not present

## 2023-01-21 DIAGNOSIS — Z992 Dependence on renal dialysis: Secondary | ICD-10-CM | POA: Diagnosis not present

## 2023-01-21 DIAGNOSIS — N2581 Secondary hyperparathyroidism of renal origin: Secondary | ICD-10-CM | POA: Diagnosis not present

## 2023-01-21 DIAGNOSIS — N186 End stage renal disease: Secondary | ICD-10-CM | POA: Diagnosis not present

## 2023-01-24 DIAGNOSIS — N186 End stage renal disease: Secondary | ICD-10-CM | POA: Diagnosis not present

## 2023-01-24 DIAGNOSIS — N2581 Secondary hyperparathyroidism of renal origin: Secondary | ICD-10-CM | POA: Diagnosis not present

## 2023-01-24 DIAGNOSIS — Z992 Dependence on renal dialysis: Secondary | ICD-10-CM | POA: Diagnosis not present

## 2023-01-25 ENCOUNTER — Ambulatory Visit: Payer: Self-pay | Admitting: *Deleted

## 2023-01-25 NOTE — Patient Outreach (Signed)
  Care Coordination   Follow Up Visit Note   01/25/2023 Name: Terry Arroyo MRN: [id number] DOB: 1961/04/08  Terry Arroyo is a 62 y.o. year old female who sees Terry Ruths, MD for primary care. I spoke with  Terry Arroyo by phone today.  What matters to the patients health and wellness today?  Report she has been having trouble with obtaining her medications since changes with the pharmacy through her insurance.      Goals Addressed               This Visit's Progress     RNCM:" I need help with my Diabetes" (pt-stated)   On track     Care Coordination Interventions:  Last A1C from pcp office: 07-01-2022 was 8.3,   She is aware that she is in need of repeat A1C  Education provided on the goal of A1C is less that 7.0 Provided education to patient about basic DM disease process.Education provided on a heart healthy/ADA renal diet. She is aware of proper diet, still struggles to adhere.   Reviewed medications with patient and discussed importance of medication adherence. Patient is taking medications as directed. Referral to pharmacy team due to request to help with medication management and ability to obtain. Counseled on importance of regular laboratory monitoring as prescribed. Will have new A1C drawn in April Discussed plans with patient for ongoing care management follow up and provided patient with direct contact information for care management team. Provided the patient with the [place] number. Review of how to reach the [facility] and the patient took the [facility] number down again to have on hand.   Provided patient with written educational materials related to hypo and hyperglycemia and importance of correct treatment. Education and support given.  Reviewed scheduled/upcoming provider appointments including: vascular on 2/16 and PCP on 4/9 Advised patient, providing education and rationale, to check cbg TID and record, calling pcp or endocrinologist  for findings outside  established parameters.  Education provided on the goals of fasting blood sugars of <130 and post prandial of <180. Reading yesterday was 137. Review of patient status, including review of consultants reports, relevant laboratory and other test results, and medications completed           SDOH assessments and interventions completed:  No     Care Coordination Interventions:  Yes, provided   Follow up plan: Follow up call scheduled for 4/11    Encounter Outcome:  Pt. Visit Completed   Terry David, RN, MSN, [place] Care Management Care Management Coordinator [phone]

## 2023-01-26 DIAGNOSIS — N2581 Secondary hyperparathyroidism of renal origin: Secondary | ICD-10-CM | POA: Diagnosis not present

## 2023-01-26 DIAGNOSIS — Z992 Dependence on renal dialysis: Secondary | ICD-10-CM | POA: Diagnosis not present

## 2023-01-26 DIAGNOSIS — N186 End stage renal disease: Secondary | ICD-10-CM | POA: Diagnosis not present

## 2023-01-28 DIAGNOSIS — Z992 Dependence on renal dialysis: Secondary | ICD-10-CM | POA: Diagnosis not present

## 2023-01-28 DIAGNOSIS — N186 End stage renal disease: Secondary | ICD-10-CM | POA: Diagnosis not present

## 2023-01-28 DIAGNOSIS — N2581 Secondary hyperparathyroidism of renal origin: Secondary | ICD-10-CM | POA: Diagnosis not present

## 2023-01-31 DIAGNOSIS — N186 End stage renal disease: Secondary | ICD-10-CM | POA: Diagnosis not present

## 2023-01-31 DIAGNOSIS — N2581 Secondary hyperparathyroidism of renal origin: Secondary | ICD-10-CM | POA: Diagnosis not present

## 2023-01-31 DIAGNOSIS — Z992 Dependence on renal dialysis: Secondary | ICD-10-CM | POA: Diagnosis not present

## 2023-02-02 DIAGNOSIS — N2581 Secondary hyperparathyroidism of renal origin: Secondary | ICD-10-CM | POA: Diagnosis not present

## 2023-02-02 DIAGNOSIS — N186 End stage renal disease: Secondary | ICD-10-CM | POA: Diagnosis not present

## 2023-02-02 DIAGNOSIS — Z992 Dependence on renal dialysis: Secondary | ICD-10-CM | POA: Diagnosis not present

## 2023-02-04 ENCOUNTER — Encounter (INDEPENDENT_AMBULATORY_CARE_PROVIDER_SITE_OTHER): Payer: Medicare HMO

## 2023-02-04 ENCOUNTER — Ambulatory Visit (INDEPENDENT_AMBULATORY_CARE_PROVIDER_SITE_OTHER): Payer: Medicare HMO | Admitting: Nurse Practitioner

## 2023-02-04 DIAGNOSIS — Z992 Dependence on renal dialysis: Secondary | ICD-10-CM | POA: Diagnosis not present

## 2023-02-04 DIAGNOSIS — N2581 Secondary hyperparathyroidism of renal origin: Secondary | ICD-10-CM | POA: Diagnosis not present

## 2023-02-04 DIAGNOSIS — N186 End stage renal disease: Secondary | ICD-10-CM | POA: Diagnosis not present

## 2023-02-07 DIAGNOSIS — Z992 Dependence on renal dialysis: Secondary | ICD-10-CM | POA: Diagnosis not present

## 2023-02-07 DIAGNOSIS — N186 End stage renal disease: Secondary | ICD-10-CM | POA: Diagnosis not present

## 2023-02-07 DIAGNOSIS — N2581 Secondary hyperparathyroidism of renal origin: Secondary | ICD-10-CM | POA: Diagnosis not present

## 2023-02-09 DIAGNOSIS — N2581 Secondary hyperparathyroidism of renal origin: Secondary | ICD-10-CM | POA: Diagnosis not present

## 2023-02-09 DIAGNOSIS — N186 End stage renal disease: Secondary | ICD-10-CM | POA: Diagnosis not present

## 2023-02-09 DIAGNOSIS — Z992 Dependence on renal dialysis: Secondary | ICD-10-CM | POA: Diagnosis not present

## 2023-02-11 DIAGNOSIS — N2581 Secondary hyperparathyroidism of renal origin: Secondary | ICD-10-CM | POA: Diagnosis not present

## 2023-02-11 DIAGNOSIS — N186 End stage renal disease: Secondary | ICD-10-CM | POA: Diagnosis not present

## 2023-02-11 DIAGNOSIS — Z992 Dependence on renal dialysis: Secondary | ICD-10-CM | POA: Diagnosis not present

## 2023-02-14 DIAGNOSIS — Z992 Dependence on renal dialysis: Secondary | ICD-10-CM | POA: Diagnosis not present

## 2023-02-14 DIAGNOSIS — N2581 Secondary hyperparathyroidism of renal origin: Secondary | ICD-10-CM | POA: Diagnosis not present

## 2023-02-14 DIAGNOSIS — N186 End stage renal disease: Secondary | ICD-10-CM | POA: Diagnosis not present

## 2023-02-16 DIAGNOSIS — Z992 Dependence on renal dialysis: Secondary | ICD-10-CM | POA: Diagnosis not present

## 2023-02-16 DIAGNOSIS — N186 End stage renal disease: Secondary | ICD-10-CM | POA: Diagnosis not present

## 2023-02-16 DIAGNOSIS — N2581 Secondary hyperparathyroidism of renal origin: Secondary | ICD-10-CM | POA: Diagnosis not present

## 2023-02-17 ENCOUNTER — Other Ambulatory Visit (INDEPENDENT_AMBULATORY_CARE_PROVIDER_SITE_OTHER): Payer: Self-pay | Admitting: Nurse Practitioner

## 2023-02-17 ENCOUNTER — Encounter (INDEPENDENT_AMBULATORY_CARE_PROVIDER_SITE_OTHER): Payer: Medicare HMO

## 2023-02-17 ENCOUNTER — Ambulatory Visit (INDEPENDENT_AMBULATORY_CARE_PROVIDER_SITE_OTHER): Payer: Medicare HMO

## 2023-02-17 ENCOUNTER — Ambulatory Visit (INDEPENDENT_AMBULATORY_CARE_PROVIDER_SITE_OTHER): Payer: Medicare HMO | Admitting: Nurse Practitioner

## 2023-02-17 VITALS — BP 178/74 | HR 67 | Resp 18 | Ht 65.0 in | Wt 162.8 lb

## 2023-02-17 DIAGNOSIS — N186 End stage renal disease: Secondary | ICD-10-CM

## 2023-02-17 DIAGNOSIS — E119 Type 2 diabetes mellitus without complications: Secondary | ICD-10-CM | POA: Diagnosis not present

## 2023-02-17 DIAGNOSIS — Z992 Dependence on renal dialysis: Secondary | ICD-10-CM

## 2023-02-17 DIAGNOSIS — I10 Essential (primary) hypertension: Secondary | ICD-10-CM | POA: Diagnosis not present

## 2023-02-18 DIAGNOSIS — N2581 Secondary hyperparathyroidism of renal origin: Secondary | ICD-10-CM | POA: Diagnosis not present

## 2023-02-18 DIAGNOSIS — N186 End stage renal disease: Secondary | ICD-10-CM | POA: Diagnosis not present

## 2023-02-18 DIAGNOSIS — Z992 Dependence on renal dialysis: Secondary | ICD-10-CM | POA: Diagnosis not present

## 2023-02-21 DIAGNOSIS — N2581 Secondary hyperparathyroidism of renal origin: Secondary | ICD-10-CM | POA: Diagnosis not present

## 2023-02-21 DIAGNOSIS — Z992 Dependence on renal dialysis: Secondary | ICD-10-CM | POA: Diagnosis not present

## 2023-02-21 DIAGNOSIS — N186 End stage renal disease: Secondary | ICD-10-CM | POA: Diagnosis not present

## 2023-02-23 DIAGNOSIS — N2581 Secondary hyperparathyroidism of renal origin: Secondary | ICD-10-CM | POA: Diagnosis not present

## 2023-02-23 DIAGNOSIS — N186 End stage renal disease: Secondary | ICD-10-CM | POA: Diagnosis not present

## 2023-02-23 DIAGNOSIS — Z992 Dependence on renal dialysis: Secondary | ICD-10-CM | POA: Diagnosis not present

## 2023-02-25 DIAGNOSIS — N2581 Secondary hyperparathyroidism of renal origin: Secondary | ICD-10-CM | POA: Diagnosis not present

## 2023-02-25 DIAGNOSIS — Z992 Dependence on renal dialysis: Secondary | ICD-10-CM | POA: Diagnosis not present

## 2023-02-25 DIAGNOSIS — N186 End stage renal disease: Secondary | ICD-10-CM | POA: Diagnosis not present

## 2023-02-28 DIAGNOSIS — N186 End stage renal disease: Secondary | ICD-10-CM | POA: Diagnosis not present

## 2023-02-28 DIAGNOSIS — Z992 Dependence on renal dialysis: Secondary | ICD-10-CM | POA: Diagnosis not present

## 2023-02-28 DIAGNOSIS — N2581 Secondary hyperparathyroidism of renal origin: Secondary | ICD-10-CM | POA: Diagnosis not present

## 2023-03-01 ENCOUNTER — Ambulatory Visit: Payer: Medicare HMO | Attending: Family | Admitting: Family

## 2023-03-01 ENCOUNTER — Encounter: Payer: Self-pay | Admitting: Family

## 2023-03-01 VITALS — BP 152/69 | HR 59 | Wt 163.2 lb

## 2023-03-01 DIAGNOSIS — Z992 Dependence on renal dialysis: Secondary | ICD-10-CM | POA: Diagnosis not present

## 2023-03-01 DIAGNOSIS — I10 Essential (primary) hypertension: Secondary | ICD-10-CM | POA: Diagnosis not present

## 2023-03-01 DIAGNOSIS — Z86018 Personal history of other benign neoplasm: Secondary | ICD-10-CM | POA: Diagnosis not present

## 2023-03-01 DIAGNOSIS — I2510 Atherosclerotic heart disease of native coronary artery without angina pectoris: Secondary | ICD-10-CM | POA: Diagnosis not present

## 2023-03-01 DIAGNOSIS — N2581 Secondary hyperparathyroidism of renal origin: Secondary | ICD-10-CM | POA: Diagnosis not present

## 2023-03-01 DIAGNOSIS — Z8616 Personal history of COVID-19: Secondary | ICD-10-CM | POA: Diagnosis not present

## 2023-03-01 DIAGNOSIS — I252 Old myocardial infarction: Secondary | ICD-10-CM | POA: Diagnosis not present

## 2023-03-01 DIAGNOSIS — E1151 Type 2 diabetes mellitus with diabetic peripheral angiopathy without gangrene: Secondary | ICD-10-CM | POA: Diagnosis not present

## 2023-03-01 DIAGNOSIS — F419 Anxiety disorder, unspecified: Secondary | ICD-10-CM | POA: Insufficient documentation

## 2023-03-01 DIAGNOSIS — R5383 Other fatigue: Secondary | ICD-10-CM | POA: Insufficient documentation

## 2023-03-01 DIAGNOSIS — N186 End stage renal disease: Secondary | ICD-10-CM | POA: Diagnosis not present

## 2023-03-01 DIAGNOSIS — Z7951 Long term (current) use of inhaled steroids: Secondary | ICD-10-CM | POA: Diagnosis not present

## 2023-03-01 DIAGNOSIS — E785 Hyperlipidemia, unspecified: Secondary | ICD-10-CM | POA: Insufficient documentation

## 2023-03-01 DIAGNOSIS — Z79899 Other long term (current) drug therapy: Secondary | ICD-10-CM | POA: Insufficient documentation

## 2023-03-01 DIAGNOSIS — Z86718 Personal history of other venous thrombosis and embolism: Secondary | ICD-10-CM | POA: Diagnosis not present

## 2023-03-01 DIAGNOSIS — K219 Gastro-esophageal reflux disease without esophagitis: Secondary | ICD-10-CM | POA: Diagnosis not present

## 2023-03-01 DIAGNOSIS — E1122 Type 2 diabetes mellitus with diabetic chronic kidney disease: Secondary | ICD-10-CM | POA: Diagnosis not present

## 2023-03-01 DIAGNOSIS — E1136 Type 2 diabetes mellitus with diabetic cataract: Secondary | ICD-10-CM | POA: Diagnosis not present

## 2023-03-01 DIAGNOSIS — Z87891 Personal history of nicotine dependence: Secondary | ICD-10-CM | POA: Insufficient documentation

## 2023-03-01 DIAGNOSIS — Z951 Presence of aortocoronary bypass graft: Secondary | ICD-10-CM | POA: Insufficient documentation

## 2023-03-01 DIAGNOSIS — R0789 Other chest pain: Secondary | ICD-10-CM | POA: Diagnosis not present

## 2023-03-01 DIAGNOSIS — Z8673 Personal history of transient ischemic attack (TIA), and cerebral infarction without residual deficits: Secondary | ICD-10-CM | POA: Diagnosis not present

## 2023-03-01 DIAGNOSIS — G4730 Sleep apnea, unspecified: Secondary | ICD-10-CM | POA: Insufficient documentation

## 2023-03-01 DIAGNOSIS — I5022 Chronic systolic (congestive) heart failure: Secondary | ICD-10-CM | POA: Insufficient documentation

## 2023-03-01 DIAGNOSIS — F32A Depression, unspecified: Secondary | ICD-10-CM | POA: Insufficient documentation

## 2023-03-01 DIAGNOSIS — Z794 Long term (current) use of insulin: Secondary | ICD-10-CM | POA: Diagnosis not present

## 2023-03-01 DIAGNOSIS — I132 Hypertensive heart and chronic kidney disease with heart failure and with stage 5 chronic kidney disease, or end stage renal disease: Secondary | ICD-10-CM | POA: Diagnosis not present

## 2023-03-01 NOTE — Patient Instructions (Signed)
Call us in the future if you need us for anything 

## 2023-03-01 NOTE — Progress Notes (Signed)
Patient ID: Terry Arroyo, female    DOB: 1961-04-15, 62 y.o.   MRN: [id number]  HPI  Terry Arroyo is a 62 y/o female with a history of DM, hyperlipidemia, HTN, brain tumor, left eye blindness, previous tobacco use and chronic heart failure.   Echo report from 06/13/21 reviewed and showed an EF of 35-40% along with mild MR and mild/moderate TR. Echo report from 02/27/20 reviewed and showed an EF of 30-35% along with mild MR.   Was in the ED 10/30/22 due to ankle pain.    She presents today for a HF follow-up visit although hasn't been seen since 12/2021. She presents with a chief complaint of moderate fatigue with minimal exertion. Describes this as chronic in nature. Has associated chest tightness, cough, intermittent SOB, difficulty sleeping, weakness, numbness/ tingling in legs, headaches and occasional light-headedness along with this. Denies any abdominal distention, palpitations, pedal edema or chest pain.   Getting dialysis on M, W, F. Says that she was up in [place] for ~ 6 months settling her mom's estate and was able to continue dialysis while there. She says that she was under considerable stress during this time.   Takes her lasix on her non-dialysis days. Gets weighed at dialysis.   Past Medical History:  Diagnosis Date   Anemia    Angina at rest    Angioedema 03/20/2021   Lisinopril   Anterior cerebral aneurysm    approximately 5 mm; left MCA    Anxiety    Aortic atherosclerosis (HCC)    Asthma    Blind left eye    CHF (congestive heart failure) (HCC)    COPD (chronic obstructive pulmonary disease) (HCC)    Coronary artery disease    Depression    DOE (dyspnea on exertion)    ESRD (end stage renal disease) (HCC)    Gait instability    Genital herpes    GERD (gastroesophageal reflux disease)    HFrEF (heart failure with reduced ejection fraction) ([place])    History of 2019 novel coronavirus disease (COVID-19) 03/2019   [place]   History of DVT  (deep vein thrombosis)    History of DVT (deep vein thrombosis)    Hx of CABG 2018   x 4; performed in [place]   Hyperlipidemia    Hypertension    MI (myocardial infarction) ([place]) 2018   no stents   Pituitary adenoma ([place])    PVD (posterior vitreous detachment), right eye    Secondary hyperparathyroidism ([place])    Sleep apnea    non-compliant with nocturnal PAP therapy   Stroke [facility]) 2014   stated affected left side.   T2DM (type 2 diabetes mellitus) [facility])    Past Surgical History:  Procedure Laterality Date   A/V FISTULAGRAM Right 12/29/2021   Procedure: A/V FISTULAGRAM;  Surgeon: Katha Cabal, MD;  Location: [place] [facility];  Service: Cardiovascular;  Laterality: Right;   A/V FISTULAGRAM Right 06/15/2022   Procedure: A/V Fistulagram;  Surgeon: Katha Cabal, MD;  Location: [place] [facility];  Service: Cardiovascular;  Laterality: Right;   AV FISTULA PLACEMENT Right 01/28/2021   Procedure: ARTERIOVENOUS (AV) FISTULA CREATION;  Surgeon: Katha Cabal, MD;  Location: [facility] ORS;  Service: Vascular;  Laterality: Right;   AV FISTULA PLACEMENT Right 05/13/2021   Procedure: INSERTION OF ARTERIOVENOUS (AV) GORE-TEX GRAFT ARM ( BRACHIAL AXILLARY );  Surgeon: Katha Cabal, MD;  Location: [facility] OR[facility];  Service: Vascular;  Laterality:  Right;   BRAIN SURGERY  1986   pituitary tumor   BREAST EXCISIONAL BIOPSY Left yrs ago   benign   CESAREAN SECTION     CORONARY ARTERY BYPASS GRAFT  2018   x 4 vessels; performed in [place]   DIALYSIS/PERMA CATHETER INSERTION N/A 11/07/2020   Procedure: DIALYSIS/PERMA CATHETER INSERTION;  Surgeon: Algernon Huxley, MD;  Location: [place] [facility];  Service: Cardiovascular;  Laterality: N/A;   DIALYSIS/PERMA CATHETER REMOVAL N/A 06/12/2021   Procedure: DIALYSIS/PERMA CATHETER REMOVAL;  Surgeon: Algernon Huxley, MD;  Location: [place] [facility];  Service: Cardiovascular;  Laterality: N/A;   ESOPHAGOGASTRODUODENOSCOPY (EGD) WITH PROPOFOL N/A  12/22/2021   Procedure: ESOPHAGOGASTRODUODENOSCOPY (EGD) WITH PROPOFOL;  Surgeon: Lesly Rubenstein, MD;  Location: [facility] END[facility];  Service: Gastroenterology;  Laterality: N/A;  IDDM   EYE SURGERY Right 2021   cataracts    PERIPHERAL VASCULAR THROMBECTOMY Right 04/03/2021   Procedure: A/V Fistulagram ;  Surgeon: Katha Cabal, MD;  Location: [place] [facility];  Service: Cardiovascular;  Laterality: Right;   Family History  Problem Relation Age of Onset   CAD Mother    CAD Brother    Breast cancer Sister 53   Social History   Tobacco Use   Smoking status: Former    Types: Cigarettes    Quit date: 2018    Years since quitting: 6.1   Smokeless tobacco: Never  Substance Use Topics   Alcohol use: Not Currently   Allergies  Allergen Reactions   Hydralazine Hives, Shortness Of Breath, Swelling and Rash    Body aches    Kiwi Extract Hives, Swelling and Rash    Mouth swells    Lisinopril Swelling    Angioedema   Shellfish Allergy Anaphylaxis    Shrimp/lobster  Betadine leaves welts on skin     Betadine [Povidone Iodine] Hives and Rash    LEAVES WELTS ON SKIN   Septra [Sulfamethoxazole-Trimethoprim]    Metformin Diarrhea and Other (See Comments)    GI Intolerance   Tape Itching    Use paper tape whenever possible   Prior to Admission medications   Medication Sig Start Date End Date Taking? Authorizing Provider  acetaminophen (TYLENOL) 500 MG tablet Take 1,000 mg by mouth every 6 (six) hours as needed for mild pain or moderate pain.   Yes [provider]  albuterol (VENTOLIN HFA) 108 (90 Base) MCG/ACT inhaler Inhale 2 puffs into the lungs every 6 (six) hours as needed for wheezing or shortness of breath.   Yes [provider]  amLODipine (NORVASC) 10 MG tablet Take 1 tablet (10 mg total) by mouth daily. 03/03/20  Yes Lorella Nimrod, MD  ammonium lactate (LAC-HYDRIN) 12 % lotion Apply 1 application  topically every Monday, Wednesday, and Friday.   Yes  [provider]  atorvastatin (LIPITOR) 80 MG tablet Take 1 tablet (80 mg total) by mouth daily at 6 PM. 11/26/19  Yes Lavina Hamman, MD  Blood Glucose Monitoring Suppl (TRUE METRIX METER) w/Device KIT  09/02/21  Yes [provider]  calcium acetate (PHOSLO) 667 MG capsule Take by mouth. 11/18/22  Yes [provider]  carvedilol (COREG) 25 MG tablet Take 25 mg by mouth 2 (two) times daily.   Yes [provider]  famotidine (PEPCID) 40 MG tablet Take 1 tablet by mouth as needed for heartburn. 01/28/22  Yes [provider]  furosemide (LASIX) 80 MG tablet Take 80 mg by mouth 4 (four) times a week.  Tues, Thurs, Sat, and Sun (non-dialysis days)   Yes [provider]  gabapentin (NEURONTIN) 100 MG capsule Take 100 mg by mouth daily as needed (pain). 10/14/21  Yes [provider]  HYDROcodone-acetaminophen (NORCO) 5-325 MG tablet Take 1 tablet by mouth every 4 (four) hours as needed for moderate pain. 10/30/22  Yes Bradler, Vista Lawman, MD  hyoscyamine (LEVSIN SL) 0.125 MG SL tablet Place 1 tablet under the tongue every 6 (six) hours as needed. 03/03/22  Yes [provider]  insulin aspart protamine- aspart (NOVOLOG MIX 70/30) (70-30) 100 UNIT/ML injection Inject 0.15 mLs (15 Units total) into the skin 2 (two) times daily with a meal. Patient taking differently: Inject 15 Units into the skin 3 (three) times daily with meals. 11/12/20  Yes Fritzi Mandes, MD  levofloxacin (LEVAQUIN) 500 MG tablet Take 500 mg by mouth every other day. 12/31/22  Yes [provider]  lidocaine-prilocaine (EMLA) cream Apply 1 application topically as needed (fistula access).   Yes [provider]  loperamide (IMODIUM) 2 MG capsule Take by mouth. 08/27/22  Yes [provider]  mirtazapine (REMERON) 30 MG tablet Take 30 mg by mouth at bedtime.   Yes [provider]  multivitamin (RENA-VIT) TABS tablet Take 1 tablet by mouth every Monday,  Wednesday, and Friday.   Yes [provider]  riboflavin (VITAMIN B-2) 100 MG TABS tablet Take 2 tablets by mouth daily. 05/19/18  Yes [provider]  topiramate (TOPAMAX) 25 MG tablet Take 25 mg by mouth daily. 08/19/20  Yes [provider]  traZODone (DESYREL) 50 MG tablet Take 0.5 tablets (25 mg total) by mouth at bedtime. 04/16/22  Yes Richarda Osmond, MD  TRUE METRIX BLOOD GLUCOSE TEST test strip  09/04/21  Yes [provider]  vitamin B-12 (CYANOCOBALAMIN) 1000 MCG tablet Take 1,000 mcg by mouth daily.   Yes [provider]  aspirin 81 MG EC tablet Take 1 tablet (81 mg total) by mouth daily. 02/10/21   Max Sane, MD   Review of Systems  Constitutional:  Positive for fatigue (easily). Negative for appetite change.  HENT:  Negative for congestion, postnasal drip and sore throat.   Eyes:  Positive for visual disturbance (blind in left eye). Negative for pain.  Respiratory:  Positive for cough (productive), chest tightness and shortness of breath (off and on).   Cardiovascular:  Negative for chest pain, palpitations and leg swelling.  Gastrointestinal:  Negative for abdominal distention and abdominal pain.  Endocrine: Negative.   Genitourinary: Negative.   Musculoskeletal:  Positive for back pain. Negative for arthralgias and neck pain.  Skin: Negative.   Allergic/Immunologic: Negative.   Neurological:  Positive for weakness, light-headedness (at times), numbness (tingling in legs) and headaches. Negative for dizziness.  Hematological:  Negative for adenopathy. Does not bruise/bleed easily.  Psychiatric/Behavioral:  Positive for sleep disturbance (sleeping on 2 pillows; trouble sleeping due to leg tingling). Negative for dysphoric mood. The patient is not nervous/anxious.    Vitals:   03/01/23 1428  BP: (!) 152/69  Pulse: (!) 59  SpO2: 97%  Weight: 163 lb 4 oz (74 kg)   Wt Readings from Last 3 Encounters:  03/01/23 163 lb 4 oz (74 kg)   02/17/23 162 lb 12.8 oz (73.8 kg)  10/30/22 178 lb (80.7 kg)   Lab Results  Component Value Date   CREATININE 5.71 (H) 10/30/2022   CREATININE 4.75 (H) 09/29/2022   CREATININE 4.53 (H) 06/01/2022   Physical Exam Vitals and nursing  note reviewed.  Constitutional:      Appearance: Normal appearance.  HENT:     Head: Normocephalic and atraumatic.  Cardiovascular:     Rate and Rhythm: Regular rhythm. Bradycardia present.  Pulmonary:     Effort: Pulmonary effort is normal. No respiratory distress.     Breath sounds: No wheezing or rales.  Abdominal:     General: There is no distension.     Palpations: Abdomen is soft.  Musculoskeletal:        General: No tenderness.     Cervical back: Normal range of motion and neck supple.     Right lower leg: No edema.     Left lower leg: No edema.  Skin:    General: Skin is warm and dry.  Neurological:     Mental Status: She is alert and oriented to person, place, and time. Mental status is at baseline.  Psychiatric:        Mood and Affect: Affect normal.        Behavior: Behavior normal.        Thought Content: Thought content normal.    Assessment & Plan:  1: Chronic heart failure with reduced ejection fraction- - NYHA class III - euvolemic today - weighing at dialysis as she says that her weight fluctuates in between sessions - weight down 6 pounds from last visit here 14 months ago - not adding salt  - saw cardiology Clayborn Bigness) 03/18/22 - carvedilol '25mg'$  BID - furosemide '80mg'$  on non-dialysis days (T, TH, Sa, Su) - renal function limits other GDMT - BNP 10/30/22 was 2774.2  2: HTN- - BP 152/69 - amlodipine '10mg'$  daily - saw PCP Ouida Sills) 11/04/22  3: DM- - saw endocrinology Pasty Arch) 06/19/20 - A1c 12/31/21 was 8.3%  4: ESRD- - BMP from 10/30/22 reviewed and showed sodium 134, potassium 3.7, creatinine 5.71 and GFR 8 - saw nephrology ([place]) 11/18/21 - saw vascular Owens Shark) 02/17/23 - dialysis M, W, F   Medication  list reviewed.   Due to HF stability and with her being on dialysis, will not make a return appointment at this time. Advised that she could call back at any time for questions or to make an appointment and she was comfortable with this.

## 2023-03-02 DIAGNOSIS — N2581 Secondary hyperparathyroidism of renal origin: Secondary | ICD-10-CM | POA: Diagnosis not present

## 2023-03-02 DIAGNOSIS — N186 End stage renal disease: Secondary | ICD-10-CM | POA: Diagnosis not present

## 2023-03-02 DIAGNOSIS — Z992 Dependence on renal dialysis: Secondary | ICD-10-CM | POA: Diagnosis not present

## 2023-03-04 DIAGNOSIS — N186 End stage renal disease: Secondary | ICD-10-CM | POA: Diagnosis not present

## 2023-03-04 DIAGNOSIS — Z992 Dependence on renal dialysis: Secondary | ICD-10-CM | POA: Diagnosis not present

## 2023-03-04 DIAGNOSIS — N2581 Secondary hyperparathyroidism of renal origin: Secondary | ICD-10-CM | POA: Diagnosis not present

## 2023-03-07 DIAGNOSIS — N186 End stage renal disease: Secondary | ICD-10-CM | POA: Diagnosis not present

## 2023-03-07 DIAGNOSIS — N2581 Secondary hyperparathyroidism of renal origin: Secondary | ICD-10-CM | POA: Diagnosis not present

## 2023-03-07 DIAGNOSIS — Z992 Dependence on renal dialysis: Secondary | ICD-10-CM | POA: Diagnosis not present

## 2023-03-08 ENCOUNTER — Encounter (INDEPENDENT_AMBULATORY_CARE_PROVIDER_SITE_OTHER): Payer: Self-pay | Admitting: Nurse Practitioner

## 2023-03-08 NOTE — Progress Notes (Signed)
Subjective:    Patient ID: Terry Arroyo, female    DOB: May 26, 1961, 62 y.o.   MRN: [id number] Chief Complaint  Patient presents with   Follow-up    Follow up 6 month hda    The patient returns to the office for followup of their dialysis access.   The patient reports the function of the access has been stable. Patient denies difficulty with cannulation. The patient denies increased bleeding time after removing the needles. The patient denies hand pain or other symptoms consistent with steal phenomena.  No significant arm swelling.  The patient denies any complaints from the dialysis center or their nephrologist.  The patient denies redness or swelling at the access site. The patient denies fever or chills at home or while on dialysis.  No recent shortening of the patient's walking distance or new symptoms consistent with claudication.  No history of rest pain symptoms. No new ulcers or wounds of the lower extremities have occurred.  The patient denies amaurosis fugax or recent TIA symptoms. There are no recent neurological changes noted. There is no history of DVT, PE or superficial thrombophlebitis. No recent episodes of angina or shortness of breath documented.   Duplex ultrasound of the AV access shows a patent access.  The previously noted stenosis is not significantly changed compared to last study.  Flow volume today is 2139 cc/min (previous flow volume was 1980 cc/min)      Review of Systems  Hematological:  Does not bruise/bleed easily.  All other systems reviewed and are negative.      Objective:   Physical Exam Vitals reviewed.  HENT:     Head: Normocephalic.  Cardiovascular:     Rate and Rhythm: Normal rate.     Pulses:          Radial pulses are 2+ on the right side.     Arteriovenous access: Right arteriovenous access is present.    Comments: Good thrill and bruit Pulmonary:     Effort: Pulmonary effort is normal.  Skin:    General: Skin is warm and  dry.  Neurological:     Mental Status: She is alert and oriented to person, place, and time.  Psychiatric:        Mood and Affect: Mood normal.        Behavior: Behavior normal.        Thought Content: Thought content normal.        Judgment: Judgment normal.     BP (!) 178/74 (BP Location: Left Arm)   Pulse 67   Resp 18   Ht 5\' 5"  (1.651 m)   Wt 162 lb 12.8 oz (73.8 kg)   BMI 27.09 kg/m   Past Medical History:  Diagnosis Date   Anemia    Angina at rest    Angioedema 03/20/2021   Lisinopril   Anterior cerebral aneurysm    approximately 5 mm; left MCA    Anxiety    Aortic atherosclerosis (HCC)    Asthma    Blind left eye    CHF (congestive heart failure) (HCC)    COPD (chronic obstructive pulmonary disease) (HCC)    Coronary artery disease    Depression    DOE (dyspnea on exertion)    ESRD (end stage renal disease) (HCC)    Gait instability    Genital herpes    GERD (gastroesophageal reflux disease)    HFrEF (heart failure with reduced ejection fraction) ([place])    History of 2019 novel  coronavirus disease (COVID-19) 03/2019   [facility] [place]   History of DVT (deep vein thrombosis)    History of DVT (deep vein thrombosis)    Hx of CABG 2018   x 4; performed in [place]   Hyperlipidemia    Hypertension    MI (myocardial infarction) ([place]) 2018   no stents   Pituitary adenoma ([place])    PVD (posterior vitreous detachment), right eye    Secondary hyperparathyroidism ([place])    Sleep apnea    non-compliant with nocturnal PAP therapy   Stroke [facility]) 2014   stated affected left side.   T2DM (type 2 diabetes mellitus) ([place])     Social History   Socioeconomic History   Marital status: Single    Spouse name: Not on file   Number of children: Not on file   Years of education: Not on file   Highest education level: Not on file  Occupational History    Comment: disabled  Tobacco Use   Smoking status: Former    Types: Cigarettes    Quit date:  2018    Years since quitting: 6.2   Smokeless tobacco: Never  Vaping Use   Vaping Use: Never used  Substance and Sexual Activity   Alcohol use: Not Currently   Drug use: Not Currently   Sexual activity: Not Currently  Other Topics Concern   Not on file  Social History Narrative   Patient lives alone.  Feels safe in her home.   Social Determinants of Health   Financial Resource Strain: Not on file  Food Insecurity: No Food Insecurity (07/08/2022)   Hunger Vital Sign    Worried About Running Out of Food in the Last Year: Never true    Ran Out of Food in the Last Year: Never true  Transportation Needs: No Transportation Needs (10/05/2022)   PRAPARE - Hydrologist (Medical): No    Lack of Transportation (Non-Medical): No  Physical Activity: Not on file  Stress: Not on file  Social Connections: Not on file  Intimate Partner Violence: Not At Risk (10/05/2022)   Humiliation, Afraid, Rape, and Kick questionnaire    Fear of Current or Ex-Partner: No    Emotionally Abused: No    Physically Abused: No    Sexually Abused: No    Past Surgical History:  Procedure Laterality Date   A/V FISTULAGRAM Right 12/29/2021   Procedure: A/V FISTULAGRAM;  Surgeon: Katha Cabal, MD;  Location: [place] [facility];  Service: Cardiovascular;  Laterality: Right;   A/V FISTULAGRAM Right 06/15/2022   Procedure: A/V Fistulagram;  Surgeon: Katha Cabal, MD;  Location: [place] [facility];  Service: Cardiovascular;  Laterality: Right;   AV FISTULA PLACEMENT Right 01/28/2021   Procedure: ARTERIOVENOUS (AV) FISTULA CREATION;  Surgeon: Katha Cabal, MD;  Location: [facility];  Service: Vascular;  Laterality: Right;   AV FISTULA PLACEMENT Right 05/13/2021   Procedure: INSERTION OF ARTERIOVENOUS (AV) GORE-TEX GRAFT ARM ( BRACHIAL AXILLARY );  Surgeon: Katha Cabal, MD;  Location: [facility];  Service: Vascular;  Laterality: Right;   [place]   pituitary  tumor   BREAST EXCISIONAL BIOPSY Left yrs ago   benign   CESAREAN SECTION     CORONARY ARTERY BYPASS GRAFT  2018   x 4 vessels; performed in [place] N/A 11/07/2020   Procedure: DIALYSIS/PERMA CATHETER INSERTION;  Surgeon: Algernon Huxley, MD;  Location: [place] [facility];  Service: Cardiovascular;  Laterality: N/A;   DIALYSIS/PERMA CATHETER REMOVAL N/A 06/12/2021   Procedure: DIALYSIS/PERMA CATHETER REMOVAL;  Surgeon: Algernon Huxley, MD;  Location: [place] [facility];  Service: Cardiovascular;  Laterality: N/A;   ESOPHAGOGASTRODUODENOSCOPY (EGD) WITH PROPOFOL N/A 12/22/2021   Procedure: ESOPHAGOGASTRODUODENOSCOPY (EGD) WITH PROPOFOL;  Surgeon: Lesly Rubenstein, MD;  Location: [facility];  Service: Gastroenterology;  Laterality: N/A;  IDDM   EYE SURGERY Right 2021   cataracts    PERIPHERAL VASCULAR THROMBECTOMY Right 04/03/2021   Procedure: A/V Fistulagram ;  Surgeon: Katha Cabal, MD;  Location: [place] [facility];  Service: Cardiovascular;  Laterality: Right;    Family History  Problem Relation Age of Onset   CAD Mother    CAD Brother    Breast cancer Sister 41    Allergies  Allergen Reactions   Hydralazine Hives, Shortness Of Breath, Swelling and Rash    Body aches    Kiwi Extract Hives, Swelling and Rash    Mouth swells    Lisinopril Swelling    Angioedema   Shellfish Allergy Anaphylaxis    Shrimp/lobster  Betadine leaves welts on skin     Betadine [Povidone Iodine] Hives and Rash    LEAVES WELTS ON SKIN   Septra [Sulfamethoxazole-Trimethoprim]    Metformin Diarrhea and Other (See Comments)    GI Intolerance   Tape Itching    Use paper tape whenever possible       Latest Ref Rng & Units 10/30/2022    6:45 PM 09/29/2022    5:45 PM 06/01/2022    9:58 AM  CBC  WBC 4.0 - 10.5 K/uL 7.6  8.1  5.4   Hemoglobin 12.0 - 15.0 g/dL 11.9  12.0  10.9   Hematocrit 36.0 - 46.0 % 38.2  38.6  37.0   Platelets 150 - 400 K/uL 209   193  149       CMP     Component Value Date/Time   NA 134 (L) 10/30/2022 1845   NA 134 (L) 11/18/2013 0138   K 3.7 10/30/2022 1845   K 3.8 11/18/2013 0138   CL 103 10/30/2022 1845   CL 98 11/18/2013 0138   CO2 21 (L) 10/30/2022 1845   CO2 29 11/18/2013 0138   GLUCOSE 117 (H) 10/30/2022 1845   GLUCOSE 348 (H) 11/18/2013 0138   BUN 21 (H) 10/30/2022 1845   BUN 11 11/18/2013 0138   CREATININE 5.71 (H) 10/30/2022 1845   CREATININE 0.87 11/18/2013 0138   CALCIUM 9.1 10/30/2022 1845   CALCIUM 9.4 11/18/2013 0138   PROT 8.4 (H) 09/29/2022 1745   ALBUMIN 3.8 09/29/2022 1745   AST 16 09/29/2022 1745   ALT 12 09/29/2022 1745   ALKPHOS 139 (H) 09/29/2022 1745   BILITOT 0.8 09/29/2022 1745   GFRNONAA 8 (L) 10/30/2022 1845   GFRNONAA >60 11/18/2013 0138   GFRAA 18 (L) 09/15/2020 0403   GFRAA >60 11/18/2013 0138     No results found.     Assessment & Plan:   1. ESRD (end stage renal disease) on dialysis [facility]) Recommend:  The patient is doing well and currently has adequate dialysis access. The patient's dialysis center is not reporting any access issues. Flow pattern is stable when compared to the prior ultrasound.  The patient should have a duplex ultrasound of the dialysis access in 6 months. The patient will follow-up with me in the office after each ultrasound    2. Type  2 diabetes mellitus without complication, unspecified whether long term insulin use ([place]) Continue hypoglycemic medications as already ordered, these medications have been reviewed and there are no changes at this time.  Hgb A1C to be monitored as already arranged by primary service  3. Essential hypertension Continue antihypertensive medications as already ordered, these medications have been reviewed and there are no changes at this time.  Patient blood pressure is elevated today.  She is advised to monitor at home and if it continues with elevations to follow with primary care for  adjustment.   Current Outpatient Medications on File Prior to Visit  Medication Sig Dispense Refill   acetaminophen (TYLENOL) 500 MG tablet Take 1,000 mg by mouth every 6 (six) hours as needed for mild pain or moderate pain.     albuterol (VENTOLIN HFA) 108 (90 Base) MCG/ACT inhaler Inhale 2 puffs into the lungs every 6 (six) hours as needed for wheezing or shortness of breath.     amLODipine (NORVASC) 10 MG tablet Take 1 tablet (10 mg total) by mouth daily. 30 tablet 0   ammonium lactate (LAC-HYDRIN) 12 % lotion Apply 1 application  topically every Monday, Wednesday, and Friday.     aspirin 81 MG EC tablet Take 1 tablet (81 mg total) by mouth daily. 30 tablet 0   atorvastatin (LIPITOR) 80 MG tablet Take 1 tablet (80 mg total) by mouth daily at 6 PM. 30 tablet 0   Blood Glucose Monitoring Suppl (TRUE METRIX METER) w/Device KIT      calcium acetate (PHOSLO) 667 MG capsule Take by mouth.     carvedilol (COREG) 25 MG tablet Take 25 mg by mouth 2 (two) times daily.     famotidine (PEPCID) 40 MG tablet Take 1 tablet by mouth as needed for heartburn.     furosemide (LASIX) 80 MG tablet Take 80 mg by mouth 4 (four) times a week. Tues, Thurs, Sat, and Sun (non-dialysis days)     gabapentin (NEURONTIN) 100 MG capsule Take 100 mg by mouth daily as needed (pain).     hyoscyamine (LEVSIN SL) 0.125 MG SL tablet Place 1 tablet under the tongue every 6 (six) hours as needed.     insulin aspart protamine- aspart (NOVOLOG MIX 70/30) (70-30) 100 UNIT/ML injection Inject 0.15 mLs (15 Units total) into the skin 2 (two) times daily with a meal. (Patient taking differently: Inject 15 Units into the skin 3 (three) times daily with meals.) 10 mL 0   levofloxacin (LEVAQUIN) 500 MG tablet Take 500 mg by mouth every other day.     lidocaine-prilocaine (EMLA) cream Apply 1 application topically as needed (fistula access).     loperamide (IMODIUM) 2 MG capsule Take by mouth.     mirtazapine (REMERON) 30 MG tablet Take 30  mg by mouth at bedtime.     multivitamin (RENA-VIT) TABS tablet Take 1 tablet by mouth every Monday, Wednesday, and Friday.     riboflavin (VITAMIN B-2) 100 MG TABS tablet Take 2 tablets by mouth daily.     topiramate (TOPAMAX) 25 MG tablet Take 25 mg by mouth daily.     traZODone (DESYREL) 50 MG tablet Take 0.5 tablets (25 mg total) by mouth at bedtime.     TRUE METRIX BLOOD GLUCOSE TEST test strip      vitamin B-12 (CYANOCOBALAMIN) 1000 MCG tablet Take 1,000 mcg by mouth daily.     HYDROcodone-acetaminophen (NORCO) 5-325 MG tablet Take 1 tablet by mouth every 4 (four) hours as needed for  moderate pain. 6 tablet 0   No current facility-administered medications on file prior to visit.    There are no Patient Instructions on file for this visit. No follow-ups on file.   Kris Hartmann, NP

## 2023-03-09 DIAGNOSIS — Z992 Dependence on renal dialysis: Secondary | ICD-10-CM | POA: Diagnosis not present

## 2023-03-09 DIAGNOSIS — N2581 Secondary hyperparathyroidism of renal origin: Secondary | ICD-10-CM | POA: Diagnosis not present

## 2023-03-09 DIAGNOSIS — N186 End stage renal disease: Secondary | ICD-10-CM | POA: Diagnosis not present

## 2023-03-11 DIAGNOSIS — Z992 Dependence on renal dialysis: Secondary | ICD-10-CM | POA: Diagnosis not present

## 2023-03-11 DIAGNOSIS — N2581 Secondary hyperparathyroidism of renal origin: Secondary | ICD-10-CM | POA: Diagnosis not present

## 2023-03-11 DIAGNOSIS — N186 End stage renal disease: Secondary | ICD-10-CM | POA: Diagnosis not present

## 2023-03-14 DIAGNOSIS — N186 End stage renal disease: Secondary | ICD-10-CM | POA: Diagnosis not present

## 2023-03-14 DIAGNOSIS — Z992 Dependence on renal dialysis: Secondary | ICD-10-CM | POA: Diagnosis not present

## 2023-03-14 DIAGNOSIS — N2581 Secondary hyperparathyroidism of renal origin: Secondary | ICD-10-CM | POA: Diagnosis not present

## 2023-03-16 DIAGNOSIS — N186 End stage renal disease: Secondary | ICD-10-CM | POA: Diagnosis not present

## 2023-03-16 DIAGNOSIS — N2581 Secondary hyperparathyroidism of renal origin: Secondary | ICD-10-CM | POA: Diagnosis not present

## 2023-03-16 DIAGNOSIS — Z992 Dependence on renal dialysis: Secondary | ICD-10-CM | POA: Diagnosis not present

## 2023-03-18 DIAGNOSIS — N2581 Secondary hyperparathyroidism of renal origin: Secondary | ICD-10-CM | POA: Diagnosis not present

## 2023-03-18 DIAGNOSIS — Z992 Dependence on renal dialysis: Secondary | ICD-10-CM | POA: Diagnosis not present

## 2023-03-18 DIAGNOSIS — N186 End stage renal disease: Secondary | ICD-10-CM | POA: Diagnosis not present

## 2023-03-20 DIAGNOSIS — Z992 Dependence on renal dialysis: Secondary | ICD-10-CM | POA: Diagnosis not present

## 2023-03-20 DIAGNOSIS — N186 End stage renal disease: Secondary | ICD-10-CM | POA: Diagnosis not present

## 2023-03-21 DIAGNOSIS — Z992 Dependence on renal dialysis: Secondary | ICD-10-CM | POA: Diagnosis not present

## 2023-03-21 DIAGNOSIS — N2581 Secondary hyperparathyroidism of renal origin: Secondary | ICD-10-CM | POA: Diagnosis not present

## 2023-03-21 DIAGNOSIS — N186 End stage renal disease: Secondary | ICD-10-CM | POA: Diagnosis not present

## 2023-03-23 DIAGNOSIS — Z992 Dependence on renal dialysis: Secondary | ICD-10-CM | POA: Diagnosis not present

## 2023-03-23 DIAGNOSIS — N2581 Secondary hyperparathyroidism of renal origin: Secondary | ICD-10-CM | POA: Diagnosis not present

## 2023-03-23 DIAGNOSIS — N186 End stage renal disease: Secondary | ICD-10-CM | POA: Diagnosis not present

## 2023-03-25 DIAGNOSIS — N2581 Secondary hyperparathyroidism of renal origin: Secondary | ICD-10-CM | POA: Diagnosis not present

## 2023-03-25 DIAGNOSIS — Z992 Dependence on renal dialysis: Secondary | ICD-10-CM | POA: Diagnosis not present

## 2023-03-25 DIAGNOSIS — N186 End stage renal disease: Secondary | ICD-10-CM | POA: Diagnosis not present

## 2023-03-28 DIAGNOSIS — N2581 Secondary hyperparathyroidism of renal origin: Secondary | ICD-10-CM | POA: Diagnosis not present

## 2023-03-28 DIAGNOSIS — Z992 Dependence on renal dialysis: Secondary | ICD-10-CM | POA: Diagnosis not present

## 2023-03-28 DIAGNOSIS — N186 End stage renal disease: Secondary | ICD-10-CM | POA: Diagnosis not present

## 2023-03-29 ENCOUNTER — Other Ambulatory Visit: Payer: Self-pay | Admitting: Gastroenterology

## 2023-03-29 DIAGNOSIS — N186 End stage renal disease: Secondary | ICD-10-CM | POA: Diagnosis not present

## 2023-03-29 DIAGNOSIS — I700 Atherosclerosis of aorta: Secondary | ICD-10-CM | POA: Diagnosis not present

## 2023-03-29 DIAGNOSIS — Z794 Long term (current) use of insulin: Secondary | ICD-10-CM | POA: Diagnosis not present

## 2023-03-29 DIAGNOSIS — F3340 Major depressive disorder, recurrent, in remission, unspecified: Secondary | ICD-10-CM | POA: Diagnosis not present

## 2023-03-29 DIAGNOSIS — R1111 Vomiting without nausea: Secondary | ICD-10-CM | POA: Diagnosis not present

## 2023-03-29 DIAGNOSIS — J4489 Other specified chronic obstructive pulmonary disease: Secondary | ICD-10-CM | POA: Diagnosis not present

## 2023-03-29 DIAGNOSIS — Z992 Dependence on renal dialysis: Secondary | ICD-10-CM | POA: Diagnosis not present

## 2023-03-29 DIAGNOSIS — R197 Diarrhea, unspecified: Secondary | ICD-10-CM | POA: Diagnosis not present

## 2023-03-29 DIAGNOSIS — E1122 Type 2 diabetes mellitus with diabetic chronic kidney disease: Secondary | ICD-10-CM | POA: Diagnosis not present

## 2023-03-29 DIAGNOSIS — I25118 Atherosclerotic heart disease of native coronary artery with other forms of angina pectoris: Secondary | ICD-10-CM | POA: Diagnosis not present

## 2023-03-30 DIAGNOSIS — N186 End stage renal disease: Secondary | ICD-10-CM | POA: Diagnosis not present

## 2023-03-30 DIAGNOSIS — N2581 Secondary hyperparathyroidism of renal origin: Secondary | ICD-10-CM | POA: Diagnosis not present

## 2023-03-30 DIAGNOSIS — Z992 Dependence on renal dialysis: Secondary | ICD-10-CM | POA: Diagnosis not present

## 2023-03-31 ENCOUNTER — Ambulatory Visit: Payer: Self-pay | Admitting: *Deleted

## 2023-03-31 NOTE — Patient Outreach (Signed)
  Care Coordination   Follow Up Visit Note   03/31/2023 Name: Terry Arroyo MRN: [id number] DOB: [birth year]  Terry Arroyo is a 62 y.o. year old female who sees [name], MD for primary care. I spoke with  Terry Arroyo by phone today.  What matters to the patients health and wellness today?  Securing transportation for colonoscopy and having A1C drawn.    Goals Addressed               This Visit's Progress     RNCM:" I need help with my Diabetes" (pt-stated)   On track     Care Coordination Interventions:  Last A1C from pcp office: 07-01-2022 was 8.3,   She is aware that she is in need of repeat A1C.  State she forgot to have labs done prior to last PCP visit   Education provided on the goal of A1C is less that 7.0 Provided education to patient about basic DM disease process.Education provided on a heart healthy/ADA renal diet. She is aware of proper diet, still struggles to adhere.   Reviewed medications with patient and discussed importance of medication adherence. Patient is taking medications as directed. Referral to pharmacy team due to request to help with medication management and ability to obtain. Counseled on importance of regular laboratory monitoring as prescribed. Will notify PCP office of need for repeat Discussed plans with patient for ongoing care management follow up and provided patient with direct contact information for care management team. Provided the patient with the RNCM number. Review of how to reach the [facility] and the patient took the [facility] number down again to have on hand.   Provided patient with written educational materials related to hypo and hyperglycemia and importance of correct treatment. Education and support given.  Reviewed scheduled/upcoming provider appointments including: colonoscopy on 6/6.  Advised to need to have someone accompany her to procedure, will have to wait for her and accompany her back home.  She verbalized  understanding.  Advised patient, providing education and rationale, to check cbg TID and record, calling pcp or endocrinologist  for findings outside established parameters.  Education provided on the goals of fasting blood sugars of <130 and post prandial of <180. Reading yesterday was 183, denies reading greater than 200 Review of patient status, including review of consultants reports, relevant laboratory and other test results, and medications completed           SDOH assessments and interventions completed:  No     Care Coordination Interventions:  Yes, provided   Interventions Today    Flowsheet Row Most Recent Value  Chronic Disease   Chronic disease during today's visit Diabetes  General Interventions   General Interventions Discussed/Reviewed Doctor Visits, Labs, Communication with  Labs Hgb A1c every 3 months  Doctor Visits Discussed/Reviewed Doctor Visits Reviewed, PCP, Specialist  PCP/Specialist Visits Compliance with follow-up visit  Communication with PCP/Specialists  Education Interventions   Education Provided Provided Education  Provided Verbal Education On Nutrition, Labs, Blood Sugar Monitoring, When to see the doctor, [facility]  Labs Reviewed Hgb A1c        Follow up plan: Follow up call scheduled for 4/26    Encounter Outcome:  Pt. Visit Completed   Kemper Durie, RN, MSN, [facility] Care Management Care Management Coordinator [phone]

## 2023-04-01 DIAGNOSIS — N2581 Secondary hyperparathyroidism of renal origin: Secondary | ICD-10-CM | POA: Diagnosis not present

## 2023-04-01 DIAGNOSIS — N186 End stage renal disease: Secondary | ICD-10-CM | POA: Diagnosis not present

## 2023-04-01 DIAGNOSIS — Z992 Dependence on renal dialysis: Secondary | ICD-10-CM | POA: Diagnosis not present

## 2023-04-04 DIAGNOSIS — Z992 Dependence on renal dialysis: Secondary | ICD-10-CM | POA: Diagnosis not present

## 2023-04-04 DIAGNOSIS — E119 Type 2 diabetes mellitus without complications: Secondary | ICD-10-CM | POA: Diagnosis not present

## 2023-04-04 DIAGNOSIS — N186 End stage renal disease: Secondary | ICD-10-CM | POA: Diagnosis not present

## 2023-04-04 DIAGNOSIS — Z794 Long term (current) use of insulin: Secondary | ICD-10-CM | POA: Diagnosis not present

## 2023-04-04 DIAGNOSIS — N2581 Secondary hyperparathyroidism of renal origin: Secondary | ICD-10-CM | POA: Diagnosis not present

## 2023-04-06 DIAGNOSIS — Z992 Dependence on renal dialysis: Secondary | ICD-10-CM | POA: Diagnosis not present

## 2023-04-06 DIAGNOSIS — N2581 Secondary hyperparathyroidism of renal origin: Secondary | ICD-10-CM | POA: Diagnosis not present

## 2023-04-06 DIAGNOSIS — N186 End stage renal disease: Secondary | ICD-10-CM | POA: Diagnosis not present

## 2023-04-08 DIAGNOSIS — Z992 Dependence on renal dialysis: Secondary | ICD-10-CM | POA: Diagnosis not present

## 2023-04-08 DIAGNOSIS — N2581 Secondary hyperparathyroidism of renal origin: Secondary | ICD-10-CM | POA: Diagnosis not present

## 2023-04-08 DIAGNOSIS — N186 End stage renal disease: Secondary | ICD-10-CM | POA: Diagnosis not present

## 2023-04-11 DIAGNOSIS — Z992 Dependence on renal dialysis: Secondary | ICD-10-CM | POA: Diagnosis not present

## 2023-04-11 DIAGNOSIS — N2581 Secondary hyperparathyroidism of renal origin: Secondary | ICD-10-CM | POA: Diagnosis not present

## 2023-04-11 DIAGNOSIS — N186 End stage renal disease: Secondary | ICD-10-CM | POA: Diagnosis not present

## 2023-04-13 DIAGNOSIS — N2581 Secondary hyperparathyroidism of renal origin: Secondary | ICD-10-CM | POA: Diagnosis not present

## 2023-04-13 DIAGNOSIS — Z992 Dependence on renal dialysis: Secondary | ICD-10-CM | POA: Diagnosis not present

## 2023-04-13 DIAGNOSIS — N186 End stage renal disease: Secondary | ICD-10-CM | POA: Diagnosis not present

## 2023-04-15 ENCOUNTER — Encounter: Payer: Self-pay | Admitting: *Deleted

## 2023-04-15 DIAGNOSIS — N186 End stage renal disease: Secondary | ICD-10-CM | POA: Diagnosis not present

## 2023-04-15 DIAGNOSIS — Z992 Dependence on renal dialysis: Secondary | ICD-10-CM | POA: Diagnosis not present

## 2023-04-15 DIAGNOSIS — N2581 Secondary hyperparathyroidism of renal origin: Secondary | ICD-10-CM | POA: Diagnosis not present

## 2023-04-18 DIAGNOSIS — N186 End stage renal disease: Secondary | ICD-10-CM | POA: Diagnosis not present

## 2023-04-18 DIAGNOSIS — Z992 Dependence on renal dialysis: Secondary | ICD-10-CM | POA: Diagnosis not present

## 2023-04-18 DIAGNOSIS — N2581 Secondary hyperparathyroidism of renal origin: Secondary | ICD-10-CM | POA: Diagnosis not present

## 2023-04-19 DIAGNOSIS — Z992 Dependence on renal dialysis: Secondary | ICD-10-CM | POA: Diagnosis not present

## 2023-04-19 DIAGNOSIS — N186 End stage renal disease: Secondary | ICD-10-CM | POA: Diagnosis not present

## 2023-04-20 DIAGNOSIS — Z992 Dependence on renal dialysis: Secondary | ICD-10-CM | POA: Diagnosis not present

## 2023-04-20 DIAGNOSIS — N186 End stage renal disease: Secondary | ICD-10-CM | POA: Diagnosis not present

## 2023-04-20 DIAGNOSIS — N2581 Secondary hyperparathyroidism of renal origin: Secondary | ICD-10-CM | POA: Diagnosis not present

## 2023-04-22 DIAGNOSIS — N186 End stage renal disease: Secondary | ICD-10-CM | POA: Diagnosis not present

## 2023-04-22 DIAGNOSIS — N2581 Secondary hyperparathyroidism of renal origin: Secondary | ICD-10-CM | POA: Diagnosis not present

## 2023-04-22 DIAGNOSIS — Z992 Dependence on renal dialysis: Secondary | ICD-10-CM | POA: Diagnosis not present

## 2023-04-25 DIAGNOSIS — Z992 Dependence on renal dialysis: Secondary | ICD-10-CM | POA: Diagnosis not present

## 2023-04-25 DIAGNOSIS — N186 End stage renal disease: Secondary | ICD-10-CM | POA: Diagnosis not present

## 2023-04-25 DIAGNOSIS — N2581 Secondary hyperparathyroidism of renal origin: Secondary | ICD-10-CM | POA: Diagnosis not present

## 2023-04-26 ENCOUNTER — Ambulatory Visit: Payer: Self-pay | Admitting: *Deleted

## 2023-04-26 NOTE — Patient Outreach (Signed)
Care Coordination   Follow Up Visit Note   04/26/2023 Name: Terry Arroyo MRN: [id number] DOB: 03-13-1961  Terry Arroyo is a 62 y.o. year old female who sees [name], MD for primary care. I spoke with  [name] by phone today.  What matters to the patients health and wellness today?  Keep DM managed and find ride to colonoscopy    Goals Addressed               This Visit's Progress     RNCM:" I need help with my Diabetes" (pt-stated)   On track     Care Coordination Interventions:  Last A1C from pcp office: 07-01-2022 was 8.3,   She is aware that she is in need of repeat A1C.  State she forgot to have labs done prior to last PCP visit   Education provided on the goal of A1C is less that 7.0 Provided education to patient about basic DM disease process.Education provided on a heart healthy/ADA renal diet. She is aware of proper diet, still struggles to adhere.   Reviewed medications with patient and discussed importance of medication adherence. Patient is taking medications as directed. Referral to pharmacy team due to request to help with medication management and ability to obtain. Counseled on importance of regular laboratory monitoring as prescribed. Will notify PCP office of need for repeat Discussed plans with patient for ongoing care management follow up and provided patient with direct contact information for care management team. Provided the patient with the RNCM number. Review of how to reach the [facility] and the patient took the [facility] number down again to have on hand.   Provided patient with written educational materials related to hypo and hyperglycemia and importance of correct treatment. Education and support given.  Reviewed scheduled/upcoming provider appointments including: colonoscopy on 6/6.  Advised to need to have someone accompany her to procedure, will have to wait for her and accompany her back home.  She verbalized understanding.  Advised  patient, providing education and rationale, to check cbg TID and record, calling pcp or endocrinologist  for findings outside established parameters.  Education provided on the goals of fasting blood sugars of <130 and post prandial of <180. Reading yesterday was 183, denies reading greater than 200 Review of patient status, including review of consultants reports, relevant laboratory and other test results, and medications completed           SDOH assessments and interventions completed:  No     Care Coordination Interventions:  Yes, provided   Interventions Today    Flowsheet Row Most Recent Value  Chronic Disease   Chronic disease during today's visit Diabetes  General Interventions   General Interventions Discussed/Reviewed General Interventions Reviewed, Doctor Visits, Labs, Health Screening  Labs Hgb A1c every 3 months  [due for A1C, state this was done by dialysis team, state she will ask tomorrow for result]  Doctor Visits Discussed/Reviewed Doctor Visits Reviewed, Specialist  Health Screening Colonoscopy  [scheduled for 6/6, does not have secure transportation yet.  STate she will ask her father's friend]  PCP/Specialist Visits Compliance with follow-up visit  Education Interventions   Education Provided Provided Education  Provided Verbal Education On Nutrition, Blood Sugar Monitoring  [state blood sugar range 180-200, admits she does not always adhere to diet]  Nutrition Interventions   Nutrition Discussed/Reviewed Nutrition Reviewed, Adding fruits and vegetables, Decreasing sugar intake, Decreasing salt  [state she ate pig feet and potato salad, encouarged to  decrease carbs and salt intake]        Follow up plan: Follow up call scheduled for 5/23    Encounter Outcome:  Pt. Visit Completed   Kemper Durie, RN, MSN, [facility] Care Management Care Management Coordinator [phone]

## 2023-04-27 DIAGNOSIS — N2581 Secondary hyperparathyroidism of renal origin: Secondary | ICD-10-CM | POA: Diagnosis not present

## 2023-04-27 DIAGNOSIS — Z992 Dependence on renal dialysis: Secondary | ICD-10-CM | POA: Diagnosis not present

## 2023-04-27 DIAGNOSIS — N186 End stage renal disease: Secondary | ICD-10-CM | POA: Diagnosis not present

## 2023-04-28 DIAGNOSIS — R1111 Vomiting without nausea: Secondary | ICD-10-CM | POA: Diagnosis not present

## 2023-04-28 DIAGNOSIS — R197 Diarrhea, unspecified: Secondary | ICD-10-CM | POA: Diagnosis not present

## 2023-04-29 DIAGNOSIS — N2581 Secondary hyperparathyroidism of renal origin: Secondary | ICD-10-CM | POA: Diagnosis not present

## 2023-04-29 DIAGNOSIS — Z992 Dependence on renal dialysis: Secondary | ICD-10-CM | POA: Diagnosis not present

## 2023-04-29 DIAGNOSIS — N186 End stage renal disease: Secondary | ICD-10-CM | POA: Diagnosis not present

## 2023-05-02 ENCOUNTER — Emergency Department: Payer: Medicare HMO

## 2023-05-02 ENCOUNTER — Other Ambulatory Visit: Payer: Self-pay

## 2023-05-02 ENCOUNTER — Observation Stay
Admission: EM | Admit: 2023-05-02 | Discharge: 2023-05-03 | Disposition: A | Discharge status: Home / Self Care | Payer: Medicare HMO | Attending: Emergency Medicine | Admitting: Emergency Medicine

## 2023-05-02 DIAGNOSIS — R7989 Other specified abnormal findings of blood chemistry: Secondary | ICD-10-CM | POA: Diagnosis present

## 2023-05-02 DIAGNOSIS — Z794 Long term (current) use of insulin: Secondary | ICD-10-CM | POA: Insufficient documentation

## 2023-05-02 DIAGNOSIS — Z955 Presence of coronary angioplasty implant and graft: Secondary | ICD-10-CM | POA: Insufficient documentation

## 2023-05-02 DIAGNOSIS — I10 Essential (primary) hypertension: Secondary | ICD-10-CM | POA: Diagnosis present

## 2023-05-02 DIAGNOSIS — R0689 Other abnormalities of breathing: Secondary | ICD-10-CM | POA: Diagnosis not present

## 2023-05-02 DIAGNOSIS — I120 Hypertensive chronic kidney disease with stage 5 chronic kidney disease or end stage renal disease: Secondary | ICD-10-CM | POA: Diagnosis not present

## 2023-05-02 DIAGNOSIS — R0602 Shortness of breath: Secondary | ICD-10-CM | POA: Diagnosis not present

## 2023-05-02 DIAGNOSIS — I5020 Unspecified systolic (congestive) heart failure: Secondary | ICD-10-CM | POA: Diagnosis not present

## 2023-05-02 DIAGNOSIS — D631 Anemia in chronic kidney disease: Secondary | ICD-10-CM | POA: Diagnosis not present

## 2023-05-02 DIAGNOSIS — E1169 Type 2 diabetes mellitus with other specified complication: Secondary | ICD-10-CM | POA: Diagnosis present

## 2023-05-02 DIAGNOSIS — J45909 Unspecified asthma, uncomplicated: Secondary | ICD-10-CM | POA: Diagnosis not present

## 2023-05-02 DIAGNOSIS — I2510 Atherosclerotic heart disease of native coronary artery without angina pectoris: Secondary | ICD-10-CM | POA: Diagnosis present

## 2023-05-02 DIAGNOSIS — Z1152 Encounter for screening for COVID-19: Secondary | ICD-10-CM | POA: Diagnosis not present

## 2023-05-02 DIAGNOSIS — Z87891 Personal history of nicotine dependence: Secondary | ICD-10-CM | POA: Diagnosis not present

## 2023-05-02 DIAGNOSIS — I132 Hypertensive heart and chronic kidney disease with heart failure and with stage 5 chronic kidney disease, or end stage renal disease: Secondary | ICD-10-CM | POA: Insufficient documentation

## 2023-05-02 DIAGNOSIS — Z951 Presence of aortocoronary bypass graft: Secondary | ICD-10-CM | POA: Insufficient documentation

## 2023-05-02 DIAGNOSIS — I200 Unstable angina: Secondary | ICD-10-CM | POA: Diagnosis not present

## 2023-05-02 DIAGNOSIS — I700 Atherosclerosis of aorta: Secondary | ICD-10-CM | POA: Diagnosis present

## 2023-05-02 DIAGNOSIS — J449 Chronic obstructive pulmonary disease, unspecified: Secondary | ICD-10-CM | POA: Insufficient documentation

## 2023-05-02 DIAGNOSIS — J810 Acute pulmonary edema: Secondary | ICD-10-CM | POA: Diagnosis not present

## 2023-05-02 DIAGNOSIS — N186 End stage renal disease: Secondary | ICD-10-CM | POA: Diagnosis not present

## 2023-05-02 DIAGNOSIS — Z992 Dependence on renal dialysis: Secondary | ICD-10-CM | POA: Insufficient documentation

## 2023-05-02 DIAGNOSIS — E1122 Type 2 diabetes mellitus with diabetic chronic kidney disease: Secondary | ICD-10-CM | POA: Insufficient documentation

## 2023-05-02 DIAGNOSIS — N2581 Secondary hyperparathyroidism of renal origin: Secondary | ICD-10-CM | POA: Diagnosis not present

## 2023-05-02 DIAGNOSIS — Z86718 Personal history of other venous thrombosis and embolism: Secondary | ICD-10-CM | POA: Diagnosis not present

## 2023-05-02 DIAGNOSIS — Z8673 Personal history of transient ischemic attack (TIA), and cerebral infarction without residual deficits: Secondary | ICD-10-CM | POA: Insufficient documentation

## 2023-05-02 DIAGNOSIS — Z7982 Long term (current) use of aspirin: Secondary | ICD-10-CM | POA: Diagnosis not present

## 2023-05-02 DIAGNOSIS — F32A Depression, unspecified: Secondary | ICD-10-CM | POA: Diagnosis present

## 2023-05-02 DIAGNOSIS — E785 Hyperlipidemia, unspecified: Secondary | ICD-10-CM | POA: Diagnosis present

## 2023-05-02 DIAGNOSIS — R000 Tachycardia, unspecified: Secondary | ICD-10-CM | POA: Diagnosis not present

## 2023-05-02 DIAGNOSIS — J811 Chronic pulmonary edema: Secondary | ICD-10-CM | POA: Diagnosis not present

## 2023-05-02 LAB — CBC
HCT: 36 % (ref 36.0–46.0)
Hemoglobin: 11.7 g/dL — ABNORMAL LOW (ref 12.0–15.0)
MCH: 29.9 pg (ref 26.0–34.0)
MCHC: 32.5 g/dL (ref 30.0–36.0)
MCV: 92.1 fL (ref 80.0–100.0)
Platelets: 187 10*3/uL (ref 150–400)
RBC: 3.91 MIL/uL (ref 3.87–5.11)
RDW: 14.2 % (ref 11.5–15.5)
WBC: 7.7 10*3/uL (ref 4.0–10.5)
nRBC: 0 % (ref 0.0–0.2)

## 2023-05-02 LAB — BASIC METABOLIC PANEL
Anion gap: 13 (ref 5–15)
BUN: 42 mg/dL — ABNORMAL HIGH (ref 6–20)
CO2: 27 mmol/L (ref 22–32)
Calcium: 9.9 mg/dL (ref 8.9–10.3)
Chloride: 96 mmol/L — ABNORMAL LOW (ref 98–111)
Creatinine, Ser: 8.16 mg/dL — ABNORMAL HIGH (ref 0.44–1.00)
GFR, Estimated: 5 mL/min — ABNORMAL LOW (ref >60)
Glucose, Bld: 163 mg/dL — ABNORMAL HIGH (ref 70–99)
Potassium: 5 mmol/L (ref 3.5–5.1)
Sodium: 136 mmol/L (ref 135–145)

## 2023-05-02 LAB — CBG MONITORING, ED: Glucose-Capillary: 124 mg/dL — ABNORMAL HIGH (ref 70–99)

## 2023-05-02 LAB — SARS CORONAVIRUS 2 BY RT PCR: SARS Coronavirus 2 by RT PCR: NEGATIVE

## 2023-05-02 LAB — TROPONIN I (HIGH SENSITIVITY): Troponin I (High Sensitivity): 32 ng/L — ABNORMAL HIGH (ref <18)

## 2023-05-02 MED ORDER — SENNOSIDES-DOCUSATE SODIUM 8.6-50 MG PO TABS: 1.0000 | ORAL_TABLET | Freq: Every evening | ORAL | Status: DC | PRN

## 2023-05-02 MED ORDER — INSULIN ASPART 100 UNIT/ML IJ SOLN
0.0000 [IU] | Freq: Three times a day (TID) | INTRAMUSCULAR | Status: DC
  Administered 2023-05-03: 2 [IU] via SUBCUTANEOUS
  Filled 2023-05-02: qty 1

## 2023-05-02 MED ORDER — INSULIN ASPART 100 UNIT/ML IJ SOLN: 0.0000 [IU] | Freq: Every day | INTRAMUSCULAR | Status: DC

## 2023-05-02 MED ORDER — ONDANSETRON HCL 4 MG/2ML IJ SOLN
4.0000 mg | Freq: Four times a day (QID) | INTRAMUSCULAR | Status: DC | PRN
  Filled 2023-05-02: qty 2

## 2023-05-02 MED ORDER — HEPARIN SODIUM (PORCINE) 5000 UNIT/ML IJ SOLN
5000.0000 [IU] | Freq: Three times a day (TID) | INTRAMUSCULAR | Status: DC
  Administered 2023-05-02 – 2023-05-03 (×2): 5000 [IU] via SUBCUTANEOUS
  Filled 2023-05-02 (×2): qty 1

## 2023-05-02 MED ORDER — ACETAMINOPHEN 325 MG RE SUPP: 650.0000 mg | Freq: Four times a day (QID) | RECTAL | Status: DC | PRN

## 2023-05-02 MED ORDER — ONDANSETRON HCL 4 MG PO TABS
4.0000 mg | ORAL_TABLET | Freq: Four times a day (QID) | ORAL | Status: DC | PRN
  Filled 2023-05-02: qty 1

## 2023-05-02 MED ORDER — ACETAMINOPHEN 325 MG PO TABS
650.0000 mg | ORAL_TABLET | Freq: Four times a day (QID) | ORAL | Status: DC | PRN
  Administered 2023-05-03: 650 mg via ORAL
  Filled 2023-05-02: qty 2

## 2023-05-02 NOTE — ED Triage Notes (Signed)
Arrives via [facility] from dialysis. C/O SOB.  Last dialysis on Friday.  100% RA.  VS wnl.  No dialysis treatement today.

## 2023-05-02 NOTE — ED Provider Notes (Signed)
[facility] Provider Note    Event Date/Time   First MD Initiated Contact with Patient 05/02/23 1510     (approximate)   History   Shortness of Breath   HPI  Terry Arroyo is a 62 y.o. female with ESRD who comes in with concern for shortness of breath.  Patient supposed to be dialyzed Monday, Wednesday, Friday.  She was last dialyzed on Friday.  She was supposed to get it here but when she went to dialysis center she had labored breathing and was short of breath and they sent her here.  Patient reports she has asthma.  She denies any chest pain, fevers, cough.  She denies smoking history.  Denies any history of blood clots.  She denies any falls or hitting her head.  She states that she has had some labored breathing like this previously related to extra fluid on her.  Physical Exam   Triage Vital Signs: ED Triage Vitals  Enc Vitals Group     BP 05/02/23 1136 (!) 169/71     Pulse Rate 05/02/23 1136 79     Resp 05/02/23 1136 (!) 24     Temp 05/02/23 1140 99 F (37.2 C)     Temp Source 05/02/23 1140 Oral     SpO2 05/02/23 1136 98 %     Weight 05/02/23 1137 176 lb (79.8 kg)     Height 05/02/23 1137 5\' 6"  (1.676 m)     Head Circumference --      Peak Flow --      Pain Score 05/02/23 1137 0     Pain Loc --      Pain Edu? --      Excl. in GC? --     Most recent vital signs: Vitals:   05/02/23 1140 05/02/23 1415  BP:  (!) 160/55  Pulse:  (!) 57  Resp:  (!) 30  Temp: 99 F (37.2 C)   SpO2:  100%     General: Awake, no distress.  CV:  Good peripheral perfusion.  Resp:  Normal effort.  No wheezing Abd:  No distention.  Other:  Trace edema Right arm fistula   ED Results / Procedures / Treatments   Labs (all labs ordered are listed, but only abnormal results are displayed) Labs Reviewed  BASIC METABOLIC PANEL - Abnormal; Notable for the following components:      Result Value   Chloride 96 (*)    Glucose, Bld 163 (*)    BUN 42 (*)     Creatinine, Ser 8.16 (*)    GFR, Estimated 5 (*)    All other components within normal limits  CBC - Abnormal; Notable for the following components:   Hemoglobin 11.7 (*)    All other components within normal limits  TROPONIN I (HIGH SENSITIVITY) - Abnormal; Notable for the following components:   Troponin I (High Sensitivity) 32 (*)    All other components within normal limits  TROPONIN I (HIGH SENSITIVITY)     EKG  My interpretation of EKG:  Sinus bradycardia rate of 57 without any ST elevation or T wave inversions, QTc 482  RADIOLOGY I have reviewed the xray personally and interpreted and patient has interstitial edema with cardiomegaly  PROCEDURES:  Critical Care performed: No  .1-3 Lead EKG Interpretation  Performed by: Concha Se, MD Authorized by: Concha Se, MD     Interpretation: normal     ECG rate:  50   ECG  rate assessment: normal     Rhythm: sinus bradycardia     Ectopy: none     Conduction: normal      MEDICATIONS ORDERED IN ED: Medications - No data to display   IMPRESSION / MDM / ASSESSMENT AND PLAN / ED COURSE  I reviewed the triage vital signs and the nursing notes.   Patient's presentation is most consistent with exacerbation of chronic illness.   Patient comes in with increased work of breathing.  Given elevated respiratory rate and work of breathing patient was placed on 2 L.  This is most concerning for fluid overload.  Differential also includes ACS, COVID, pneumonia, PE seems less likely.  BMP shows potassium of 5.0.  CBC shows stable hemoglobin.  Troponin is stable from prior.  Will discuss with the hospitalist for admission given concern for new work of breathing and edema.  The patient is on the cardiac monitor to evaluate for evidence of arrhythmia and/or significant heart rate changes.      FINAL CLINICAL IMPRESSION(S) / ED DIAGNOSES   Final diagnoses:  ESRD (end stage renal disease) on dialysis (HCC)  Acute  pulmonary edema (HCC)     Rx / DC Orders   ED Discharge Orders     None        Note:  This document was prepared using Dragon voice recognition software and may include unintentional dictation errors.   Concha Se, MD 05/02/23 [phone]

## 2023-05-02 NOTE — Assessment & Plan Note (Signed)
Presumed secondary to fluid overload requiring dialysis

## 2023-05-02 NOTE — Assessment & Plan Note (Signed)
Presumed secondary to pulmonary edema in setting of end-stage renal disease on hemodialysis due for her dialysis on day of admission Strict I's and O's Nephrology has been consulted by EDP, appreciate input and intervention Admit to telemetry medical, observation

## 2023-05-02 NOTE — Assessment & Plan Note (Signed)
-   Insulin SSI with at bedtime coverage ordered ?- Goal inpatient blood glucose levels 140-180 ?

## 2023-05-02 NOTE — Procedures (Signed)
Received patient in bed to unit.  Alert and oriented.  Informed consent signed and in chart.   TX duration: 3.5hrs  Patient tolerated well.  Transported back to the room  Alert, without acute distress.  Hand-off given to patient's nurse.   Access used: Right upper arm AVF. Access issues: NONE  Total UF removed: 2.5L Medication(s) given: NONE  [facility]

## 2023-05-02 NOTE — ED Notes (Signed)
Called lab to come draw triage labs. This RN attempted twice. EMS also attempted IV.

## 2023-05-02 NOTE — ED Triage Notes (Signed)
Pt to Ed via [facility] from dialysis center for SOB since last night. Was supposed to have dialysis today, last dialysis was Friday. Hx asthma, denies hx COPD. Denies chest pain. Breathing appears labored.

## 2023-05-02 NOTE — H&P (Signed)
History and Physical   EZRIE SINDELAR ZOX:[id number] DOB: [birth year] DOA: 05/02/2023  PCP: [name], MD  Patient coming from: Dialysis center via EMS  I have personally briefly reviewed patient's old medical records in [facility] EMR.  Chief Concern: Shortness of breath  HPI: [name] is a 62 year old female with history of hypertension, hyperlipidemia, neuropathy, insulin-dependent diabetes mellitus, end-stage renal disease on hemodialysis, MWF, who presents emergency department from dialysis center for chief concerns of shortness of breath.  Vitals in the ED showed temperature of 97.8, respiration rate of 18, heart rate of 57, blood pressure 179/77, SpO2 100% on 2 L nasal cannula.  Serum sodium is 136, potassium 5.0, chloride 96, bicarb 27, BUN of 42, serum creatinine of 8.16, EGFR 5, nonfasting blood glucose 163, WBC 7.7, hemoglobin 11.7, platelets 187.  High sensitive troponin is 32.  EDP consulted nephrology, patient will be taken to dialysis.  ED treatment: None ----------------------------- At bedside, patient was able to tell me her name, age, location, current calendar year.  She appears weak.  She reports that she went to dialysis and she felt like she could not breathe.  She reports this is to prior episodes of shortness of breath on Mondays after she has not received dialysis after 2 days.  She denies chest pain, abdominal pain, dysuria, hematuria.  She states she still urinates about 3 times a day.  She reports persistent diarrhea that has been bothering her for several years.  She states that she is able to have a colonoscopy outpatient due to this diarrhea.  She denies blood in her stool.  Social history: She lives at home by herself.  She denies tobacco, EtOH, recreational drug use.  She is disabled.  ROS: Constitutional: no weight change, no fever ENT/Mouth: no sore throat, no rhinorrhea Eyes: no eye pain, no vision changes Cardiovascular: no  chest pain, + dyspnea,  no edema, no palpitations Respiratory: no cough, no sputum, no wheezing Gastrointestinal: no nausea, no vomiting, + diarrhea, no constipation Genitourinary: no urinary incontinence, no dysuria, no hematuria Musculoskeletal: no arthralgias, no myalgias Skin: no skin lesions, no pruritus, Neuro: + weakness, no loss of consciousness, no syncope Psych: no anxiety, no depression, + decrease appetite Heme/Lymph: no bruising, no bleeding  ED Course: Discussed with emergency medicine provider, patient requiring hospitalization for chief concerns of fluid overload in setting of requiring dialysis.  Assessment/Plan  Principal Problem:   Shortness of breath Active Problems:   Essential hypertension   End-stage renal disease on hemodialysis (HCC)   Dyslipidemia   Type 2 diabetes mellitus with hyperlipidemia (HCC)   Elevated troponin   CAD (coronary artery disease)   Aortic atherosclerosis (HCC)   Depression   Acute pulmonary edema (HCC)   History of DVT (deep vein thrombosis)   Assessment and Plan:  * Shortness of breath Presumed secondary to pulmonary edema in setting of end-stage renal disease on hemodialysis due for her dialysis on day of admission Strict I's and O's Nephrology has been consulted by EDP, appreciate input and intervention Admit to telemetry medical, observation  End-stage renal disease on hemodialysis [facility]) MWF Nephrology has been consulted by EDP Patient taken to the dialysis unit on day of admission Strict I's and O's Admit to telemetry medical, observation  Acute pulmonary edema (HCC) Presumed secondary to fluid overload requiring dialysis  Type 2 diabetes mellitus with hyperlipidemia (HCC) Insulin SSI with at bedtime coverage ordered Goal inpatient blood glucose levels 140-180  Chart reviewed.   DVT  prophylaxis: Heparin 5000 units subcutaneous every 8 hours Code Status: Full code Diet: Renal/carb modified Family Communication:  A phone call was offered, patient declined stating that her dad already knows she is in the hospital. Disposition Plan: Pending clinical course Consults called: Nephrology Admission status: Telemetry medical, observation  Past Medical History:  Diagnosis Date   Anemia    Angina at rest    Angioedema 03/20/2021   Lisinopril   Anterior cerebral aneurysm    approximately 5 mm; left MCA    Anxiety    Aortic atherosclerosis (HCC)    Asthma    Blind left eye    CHF (congestive heart failure) (HCC)    COPD (chronic obstructive pulmonary disease) (HCC)    Coronary artery disease    Depression    DOE (dyspnea on exertion)    ESRD (end stage renal disease) (HCC)    Gait instability    Genital herpes    GERD (gastroesophageal reflux disease)    HFrEF (heart failure with reduced ejection fraction) (HCC)    History of 2019 novel coronavirus disease (COVID-19) 03/2019   [facility] [place]   History of DVT (deep vein thrombosis)    History of DVT (deep vein thrombosis)    Hx of CABG 2018   x 4; performed in [place]   Hyperlipidemia    Hypertension    MI (myocardial infarction) (HCC) 2018   no stents   Pituitary adenoma (HCC)    PVD (posterior vitreous detachment), right eye    Secondary hyperparathyroidism (HCC)    Sleep apnea    non-compliant with nocturnal PAP therapy   Stroke [facility]) 2014   stated affected left side.   T2DM (type 2 diabetes mellitus) [facility])    Past Surgical History:  Procedure Laterality Date   A/V FISTULAGRAM Right 12/29/2021   Procedure: A/V FISTULAGRAM;  Surgeon: Renford Dills, MD;  Location: [facility] INVASIVE CV LAB;  Service: Cardiovascular;  Laterality: Right;   A/V FISTULAGRAM Right 06/15/2022   Procedure: A/V Fistulagram;  Surgeon: Renford Dills, MD;  Location: [facility] INVASIVE CV LAB;  Service: Cardiovascular;  Laterality: Right;   AV FISTULA PLACEMENT Right 01/28/2021   Procedure: ARTERIOVENOUS (AV) FISTULA CREATION;  Surgeon: Renford Dills, MD;  Location: [facility] ORS;  Service: Vascular;  Laterality: Right;   AV FISTULA PLACEMENT Right 05/13/2021   Procedure: INSERTION OF ARTERIOVENOUS (AV) GORE-TEX GRAFT ARM ( BRACHIAL AXILLARY );  Surgeon: Renford Dills, MD;  Location: [facility] ORS;  Service: Vascular;  Laterality: Right;   BRAIN SURGERY  1986   pituitary tumor   BREAST EXCISIONAL BIOPSY Left yrs ago   benign   CESAREAN SECTION     CORONARY ARTERY BYPASS GRAFT  2018   x 4 vessels; performed in [place]   DIALYSIS/PERMA CATHETER INSERTION N/A 11/07/2020   Procedure: DIALYSIS/PERMA CATHETER INSERTION;  Surgeon: Annice Needy, MD;  Location: [facility] INVASIVE CV LAB;  Service: Cardiovascular;  Laterality: N/A;   DIALYSIS/PERMA CATHETER REMOVAL N/A 06/12/2021   Procedure: DIALYSIS/PERMA CATHETER REMOVAL;  Surgeon: Annice Needy, MD;  Location: [facility] INVASIVE CV LAB;  Service: Cardiovascular;  Laterality: N/A;   ESOPHAGOGASTRODUODENOSCOPY (EGD) WITH PROPOFOL N/A 12/22/2021   Procedure: ESOPHAGOGASTRODUODENOSCOPY (EGD) WITH PROPOFOL;  Surgeon: Regis Bill, MD;  Location: [facility] ENDOSCOPY;  Service: Gastroenterology;  Laterality: N/A;  IDDM   EYE SURGERY Right 2021   cataracts    PERIPHERAL VASCULAR THROMBECTOMY Right 04/03/2021   Procedure: A/V Fistulagram ;  Surgeon: Gilda Crease,  Latina Craver, MD;  Location: [facility] INVASIVE CV LAB;  Service: Cardiovascular;  Laterality: Right;   Social History:  reports that she quit smoking about 6 years ago. Her smoking use included cigarettes. She has never used smokeless tobacco. She reports that she does not currently use alcohol. She reports that she does not currently use drugs.  Allergies  Allergen Reactions   Hydralazine Hives, Shortness Of Breath, Swelling and Rash    Body aches    Kiwi Extract Hives, Swelling and Rash    Mouth swells    Lisinopril Swelling    Angioedema   Shellfish Allergy Anaphylaxis    Shrimp/lobster  Betadine leaves welts on skin     Betadine [Povidone Iodine]  Hives and Rash    LEAVES WELTS ON SKIN   Septra [Sulfamethoxazole-Trimethoprim]    Metformin Diarrhea and Other (See Comments)    GI Intolerance   Tape Itching    Use paper tape whenever possible   Family History  Problem Relation Age of Onset   CAD Mother    CAD Brother    Breast cancer Sister 3   Family history: Family history reviewed and not pertinent.  Prior to Admission medications   Medication Sig Start Date End Date Taking? Authorizing Provider  acetaminophen (TYLENOL) 500 MG tablet Take 1,000 mg by mouth every 6 (six) hours as needed for mild pain or moderate pain.    [provider]  albuterol (VENTOLIN HFA) 108 (90 Base) MCG/ACT inhaler Inhale 2 puffs into the lungs every 6 (six) hours as needed for wheezing or shortness of breath.    [provider]  amLODipine (NORVASC) 10 MG tablet Take 1 tablet (10 mg total) by mouth daily. 03/03/20   Arnetha Courser, MD  ammonium lactate (LAC-HYDRIN) 12 % lotion Apply 1 application  topically every Monday, Wednesday, and Friday.    [provider]  aspirin 81 MG EC tablet Take 1 tablet (81 mg total) by mouth daily. 02/10/21   Delfino Lovett, MD  atorvastatin (LIPITOR) 80 MG tablet Take 1 tablet (80 mg total) by mouth daily at 6 PM. 11/26/19   Rolly Salter, MD  Blood Glucose Monitoring Suppl (TRUE METRIX METER) w/Device KIT  09/02/21   [provider]  calcium acetate (PHOSLO) 667 MG capsule Take by mouth. 11/18/22   [provider]  carvedilol (COREG) 25 MG tablet Take 25 mg by mouth 2 (two) times daily.    [provider]  famotidine (PEPCID) 40 MG tablet Take 1 tablet by mouth as needed for heartburn. 01/28/22   [provider]  furosemide (LASIX) 80 MG tablet Take 80 mg by mouth 4 (four) times a week. Tues, Thurs, Sat, and Sun (non-dialysis days)    [provider]  gabapentin (NEURONTIN) 100 MG capsule Take 100 mg by mouth daily as needed (pain). 10/14/21   [provider]  HYDROcodone-acetaminophen (NORCO) 5-325 MG tablet Take 1 tablet by mouth every 4 (four) hours as needed for moderate pain. 10/30/22   Merwyn Katos, MD  hyoscyamine (LEVSIN SL) 0.125 MG SL tablet Place 1 tablet under the tongue every 6 (six) hours as needed. 03/03/22   [provider]  insulin aspart protamine- aspart (NOVOLOG MIX 70/30) (70-30) 100 UNIT/ML injection Inject 0.15 mLs (15 Units total) into the skin 2 (two) times daily with a meal. Patient taking differently: Inject 15 Units into the skin 3 (three) times daily with meals. 11/12/20   Enedina Finner, MD  levofloxacin [facility]) 500  MG tablet Take 500 mg by mouth every other day. 12/31/22   [provider]  lidocaine-prilocaine (EMLA) cream Apply 1 application topically as needed (fistula access).    [provider]  loperamide (IMODIUM) 2 MG capsule Take by mouth. 08/27/22   [provider]  mirtazapine (REMERON) 30 MG tablet Take 30 mg by mouth at bedtime.    [provider]  multivitamin (RENA-VIT) TABS tablet Take 1 tablet by mouth every Monday, Wednesday, and Friday.    [provider]  riboflavin (VITAMIN B-2) 100 MG TABS tablet Take 2 tablets by mouth daily. 05/19/18   [provider]  topiramate (TOPAMAX) 25 MG tablet Take 25 mg by mouth daily. 08/19/20   [provider]  traZODone (DESYREL) 50 MG tablet Take 0.5 tablets (25 mg total) by mouth at bedtime. 04/16/22   Leeroy Bock, MD  TRUE METRIX BLOOD GLUCOSE TEST test strip  09/04/21   [provider]  vitamin B-12 (CYANOCOBALAMIN) 1000 MCG tablet Take 1,000 mcg by mouth daily.    [provider]   Physical Exam: Vitals:   05/02/23 2130 05/02/23 2138 05/02/23 2145 05/02/23 2156  BP: (!) 166/58 (!) 168/69 (!) 167/72   Pulse: 74 61 62   Resp: 20 20 20    Temp:   97.7 F (36.5 C)   TempSrc:   Oral   SpO2: 100% 100% 100%   Weight:    77.2 kg  Height:        Constitutional: appears older than chronological age, frail, calm Eyes: PERRL, lids and conjunctivae normal ENMT: Mucous membranes are moist. Posterior pharynx clear of any exudate or lesions. Age-appropriate dentition. Hearing appropriate Neck: normal, supple, no masses, no thyromegaly Respiratory: clear to auscultation bilaterally, no wheezing, no crackles. Normal respiratory effort. No accessory muscle use.  Cardiovascular: Regular rate and rhythm, no murmurs / rubs / gallops. No extremity edema. 2+ pedal pulses. No carotid bruits.  Abdomen: Obese abdomen, no tenderness, no masses palpated, no hepatosplenomegaly. Bowel sounds positive.  Musculoskeletal: no clubbing / cyanosis. No joint deformity upper and lower extremities. Good ROM, no contractures, no atrophy. Normal muscle tone.  Skin: no rashes, lesions, ulcers. No induration Neurologic: Sensation intact. Strength 5/5 in all 4.  Psychiatric: Normal judgment and insight. Alert and oriented x 3. Normal mood.   EKG: independently reviewed, showing sinus bradycardia with rate of 57, QTc 482  Chest x-ray on Admission: I personally reviewed and I agree with radiologist reading as below.  DG Chest 2 View  Result Date: 05/02/2023 CLINICAL DATA:  Shortness of breath EXAM: CHEST - 2 VIEW COMPARISON:  09/29/2022 FINDINGS: Cardiomegaly status post sternotomy and CABG. Pulmonary vascular congestion. Diffuse bilateral interstitial prominence. No pleural effusion or pneumothorax. IMPRESSION: Cardiomegaly with pulmonary vascular congestion and interstitial edema. Electronically Signed   By: Duanne Guess D.O.   On: 05/02/2023 12:36    Labs on Admission: I have personally reviewed following labs  CBC: Recent Labs  Lab 05/02/23 1230  WBC 7.7  HGB 11.7*  HCT 36.0  MCV 92.1  PLT 187   Basic Metabolic Panel: Recent Labs  Lab 05/02/23 1230  NA 136  K 5.0  CL 96*  CO2 27  GLUCOSE 163*  BUN 42*  CREATININE 8.16*  CALCIUM 9.9    GFR: Estimated Creatinine Clearance: 7.7 mL/min (A) (by C-G formula based on SCr of 8.16 mg/dL (H)).  Urine analysis:    Component Value Date/Time   COLORURINE YELLOW (A) 11/04/2021 4098  APPEARANCEUR HAZY (A) 11/04/2021 0035   LABSPEC 1.016 11/04/2021 0035   PHURINE 7.0 11/04/2021 0035   GLUCOSEU 150 (A) 11/04/2021 0035   HGBUR SMALL (A) 11/04/2021 0035   BILIRUBINUR NEGATIVE 11/04/2021 0035   KETONESUR NEGATIVE 11/04/2021 0035   PROTEINUR >=300 (A) 11/04/2021 0035   NITRITE NEGATIVE 11/04/2021 0035   LEUKOCYTESUR SMALL (A) 11/04/2021 0035   This document was prepared using Dragon Voice Recognition software and may include unintentional dictation errors.  Dr. Sedalia Muta [facility]  If 7PM-7AM, please contact overnight-coverage provider If 7AM-7PM, please contact day attending provider [email or web address]  05/02/2023, 10:42 PM

## 2023-05-02 NOTE — Assessment & Plan Note (Signed)
MWF Nephrology has been consulted by EDP Patient taken to the dialysis unit on day of admission Strict I's and O's Admit to telemetry medical, observation

## 2023-05-02 NOTE — Progress Notes (Signed)
[facility]  ROUNDING NOTE   Subjective:   Ms. LARISHA FERRING was admitted to [facility] on 05/02/2023 for Shortness of breath [R06.02]  Last hemodialysis treatment was Friday May 10. Patient left at 61 kg - more than1 kg above her dry weight.   Patient presented to her dialysis clinic with shortness of breath, tachypnea and wheezing. Patient was found to have SpO2 of90% despite being placed on oxygen. EMS was called and patient brought to [facility] ED.     Objective:  Vital signs in last 24 hours:  Temp:  [97.8 F (36.6 C)-99 F (37.2 C)] 97.8 F (36.6 C) (05/13 1547) Pulse Rate:  [55-79] 55 (05/13 1530) Resp:  [18-30] 18 (05/13 1530) BP: (160-179)/(55-77) 167/66 (05/13 1530) SpO2:  [98 %-100 %] 100 % (05/13 1530) Weight:  [79.7 kg-79.8 kg] 79.7 kg (05/13 1658)  Weight change:  Filed Weights   05/02/23 1137 05/02/23 1658  Weight: 79.8 kg 79.7 kg    Intake/Output: No intake/output data recorded.   Intake/Output this shift:  No intake/output data recorded.  Physical Exam: General: NAD,   Head: Normocephalic, atraumatic. Moist oral mucosal membranes  Eyes: Anicteric, PERRL  Neck: Supple, trachea midline  Lungs:  Clear to auscultation  Heart: Regular rate and rhythm  Abdomen:  Soft, nontender,   Extremities:  + peripheral edema.  Neurologic: Nonfocal, moving all four extremities  Skin: No lesions  Access: Right AVF    Basic Metabolic Panel: Recent Labs  Lab 05/02/23 1230  NA 136  K 5.0  CL 96*  CO2 27  GLUCOSE 163*  BUN 42*  CREATININE 8.16*  CALCIUM 9.9    Liver Function Tests: No results for input(s): "AST", "ALT", "ALKPHOS", "BILITOT", "PROT", "ALBUMIN" in the last 168 hours. No results for input(s): "LIPASE", "AMYLASE" in the last 168 hours. No results for input(s): "AMMONIA" in the last 168 hours.  CBC: Recent Labs  Lab 05/02/23 1230  WBC 7.7  HGB 11.7*  HCT 36.0  MCV 92.1  PLT 187    Cardiac Enzymes: No results for input(s):  "CKTOTAL", "CKMB", "CKMBINDEX", "TROPONINI" in the last 168 hours.  BNP: Invalid input(s): "POCBNP"  CBG: No results for input(s): "GLUCAP" in the last 168 hours.  Microbiology: Results for orders placed or performed during the hospital encounter of 05/02/23  SARS Coronavirus 2 by RT PCR (hospital order, performed in [facility] hospital lab) *cepheid single result test* Anterior Nasal Swab     Status: None   Collection Time: 05/02/23  3:46 PM   Specimen: Anterior Nasal Swab  Result Value Ref Range Status   SARS Coronavirus 2 by RT PCR NEGATIVE NEGATIVE Final    Comment: (NOTE) SARS-CoV-2 target nucleic acids are NOT DETECTED.  The SARS-CoV-2 RNA is generally detectable in upper and lower respiratory specimens during the acute phase of infection. The lowest concentration of SARS-CoV-2 viral copies this assay can detect is 250 copies / mL. A negative result does not preclude SARS-CoV-2 infection and should not be used as the sole basis for treatment or other patient management decisions.  A negative result may occur with improper specimen collection / handling, submission of specimen other than nasopharyngeal swab, presence of viral mutation(s) within the areas targeted by this assay, and inadequate number of viral copies (<250 copies / mL). A negative result must be combined with clinical observations, patient history, and epidemiological information.  Fact Sheet for Patients:   RoadLapTop.co.za  Fact Sheet for Healthcare Providers: http://kim-miller.com/  This test is not  yet approved or  cleared by the [place] and has been authorized for detection and/or diagnosis of SARS-CoV-2 by FDA under an Emergency Use Authorization (EUA).  This EUA will remain in effect (meaning this test can be used) for the duration of the COVID-19 declaration under Section 564(b)(1) of the Act, 21 U.S.C. section 360bbb-3(b)(1), unless the  authorization is terminated or revoked sooner.  Performed at [facility], [address] [place], [place] [address]     Coagulation Studies: No results for input(s): "LABPROT", "INR" in the last 72 hours.  Urinalysis: No results for input(s): "COLORURINE", "LABSPEC", "PHURINE", "GLUCOSEU", "HGBUR", "BILIRUBINUR", "KETONESUR", "PROTEINUR", "UROBILINOGEN", "NITRITE", "LEUKOCYTESUR" in the last 72 hours.  Invalid input(s): "APPERANCEUR"    Imaging: DG Chest 2 View  Result Date: 05/02/2023 CLINICAL DATA:  Shortness of breath EXAM: CHEST - 2 VIEW COMPARISON:  09/29/2022 FINDINGS: Cardiomegaly status post sternotomy and CABG. Pulmonary vascular congestion. Diffuse bilateral interstitial prominence. No pleural effusion or pneumothorax. IMPRESSION: Cardiomegaly with pulmonary vascular congestion and interstitial edema. Electronically Signed   By: Duanne Guess D.O.   On: 05/02/2023 12:36     Medications:     heparin  5,000 Units Subcutaneous Q8H   insulin aspart  0-5 Units Subcutaneous QHS   insulin aspart  0-6 Units Subcutaneous TID WC   acetaminophen **OR** acetaminophen, ondansetron **OR** ondansetron (ZOFRAN) IV, senna-docusate  Assessment/ Plan:  Ms. AMANOA CAUFIELD is a 62 y.o. black female with end stage renal disease on hemodialysis, hypertension, diabetes mellitus type II, diabetic retinopathy, asthma, history of angioedema, COPD, congestive heart failure status post CABG, depression, DVT, coronary artery disease, pituitary adenoma and GERD who presents to [facility] on 05/02/2023 for Shortness of breath [R06.02]  [facility] MWF [facility] Right AVF 73.5kg.  End Stage Renal Disease: with volume overload - placed on hemodialysis emergently.  - monitor daily for dialysis need.   Hypertension with chronic kidney disease: 169/71. Volume driven. Home regimen of amlodipine, carvedilol, furosemide, isosorbide mononitrate.   Anemia with chronic kidney disease: hemoglobin  11.7. Venofer and Mircera as outpatient.   Secondary Hyperparathyroidism: not currently on binders.    LOS: 0 [place]o Rothgeb 5/13/20245:50 PM

## 2023-05-02 NOTE — Hospital Course (Addendum)
[name] is a 62 year old female with history of hypertension, hyperlipidemia, neuropathy, insulin-dependent diabetes mellitus, end-stage renal disease on hemodialysis, MWF, who presents emergency department from dialysis center for chief concerns of shortness of breath.  Vitals in the ED showed temperature of 97.8, respiration rate of 18, heart rate of 57, blood pressure 179/77, SpO2 100% on 2 L nasal cannula.  Serum sodium is 136, potassium 5.0, chloride 96, bicarb 27, BUN of 42, serum creatinine of 8.16, EGFR 5, nonfasting blood glucose 163, WBC 7.7, hemoglobin 11.7, platelets 187.  High sensitive troponin is 32.  EDP consulted nephrology, patient will be taken to dialysis.  ED treatment: None

## 2023-05-03 DIAGNOSIS — Z992 Dependence on renal dialysis: Secondary | ICD-10-CM

## 2023-05-03 DIAGNOSIS — R0602 Shortness of breath: Secondary | ICD-10-CM | POA: Diagnosis not present

## 2023-05-03 DIAGNOSIS — I120 Hypertensive chronic kidney disease with stage 5 chronic kidney disease or end stage renal disease: Secondary | ICD-10-CM | POA: Diagnosis not present

## 2023-05-03 DIAGNOSIS — N2581 Secondary hyperparathyroidism of renal origin: Secondary | ICD-10-CM | POA: Diagnosis not present

## 2023-05-03 DIAGNOSIS — N186 End stage renal disease: Secondary | ICD-10-CM | POA: Diagnosis not present

## 2023-05-03 DIAGNOSIS — J810 Acute pulmonary edema: Secondary | ICD-10-CM | POA: Diagnosis not present

## 2023-05-03 DIAGNOSIS — D631 Anemia in chronic kidney disease: Secondary | ICD-10-CM | POA: Diagnosis not present

## 2023-05-03 LAB — HEPATITIS B SURFACE ANTIGEN: Hepatitis B Surface Ag: NONREACTIVE

## 2023-05-03 LAB — HEMOGLOBIN A1C
Hgb A1c MFr Bld: 8.1 % — ABNORMAL HIGH (ref 4.8–5.6)
Mean Plasma Glucose: 185.77 mg/dL

## 2023-05-03 LAB — CBC
HCT: 33.9 % — ABNORMAL LOW (ref 36.0–46.0)
Hemoglobin: 10.9 g/dL — ABNORMAL LOW (ref 12.0–15.0)
MCH: 29.9 pg (ref 26.0–34.0)
MCHC: 32.2 g/dL (ref 30.0–36.0)
MCV: 93.1 fL (ref 80.0–100.0)
Platelets: 186 10*3/uL (ref 150–400)
RBC: 3.64 MIL/uL — ABNORMAL LOW (ref 3.87–5.11)
RDW: 13.9 % (ref 11.5–15.5)
WBC: 7.2 10*3/uL (ref 4.0–10.5)
nRBC: 0 % (ref 0.0–0.2)

## 2023-05-03 LAB — TROPONIN I (HIGH SENSITIVITY): Troponin I (High Sensitivity): 27 ng/L — ABNORMAL HIGH (ref <18)

## 2023-05-03 LAB — BASIC METABOLIC PANEL
Anion gap: 11 (ref 5–15)
BUN: 25 mg/dL — ABNORMAL HIGH (ref 6–20)
CO2: 29 mmol/L (ref 22–32)
Calcium: 8.7 mg/dL — ABNORMAL LOW (ref 8.9–10.3)
Chloride: 95 mmol/L — ABNORMAL LOW (ref 98–111)
Creatinine, Ser: 5.43 mg/dL — ABNORMAL HIGH (ref 0.44–1.00)
GFR, Estimated: 8 mL/min — ABNORMAL LOW (ref >60)
Glucose, Bld: 224 mg/dL — ABNORMAL HIGH (ref 70–99)
Potassium: 3.7 mmol/L (ref 3.5–5.1)
Sodium: 135 mmol/L (ref 135–145)

## 2023-05-03 LAB — CBG MONITORING, ED: Glucose-Capillary: 202 mg/dL — ABNORMAL HIGH (ref 70–99)

## 2023-05-03 NOTE — ED Notes (Signed)
Cab called for pt, pt will self pay.

## 2023-05-03 NOTE — Progress Notes (Signed)
[facility]  ROUNDING NOTE   Subjective:   [name] was admitted to [facility] on 05/02/2023 for Shortness of breath [R06.02]  Emergent hemodialysis last night. Uf of 2.5 liters.   Objective:  Vital signs in last 24 hours:  Temp:  [97.6 F (36.4 C)-99 F (37.2 C)] 98 F (36.7 C) (05/14 0808) Pulse Rate:  [55-74] 60 (05/14 0948) Resp:  [15-30] 18 (05/14 0948) BP: (105-179)/(55-88) 146/61 (05/14 0948) SpO2:  [96 %-100 %] 96 % (05/14 0948) Weight:  [77.2 kg-79.7 kg] 77.2 kg (05/13 2156)  Weight change:  Filed Weights   05/02/23 1658 05/02/23 1753 05/02/23 2156  Weight: 79.7 kg 79.7 kg 77.2 kg    Intake/Output: I/O last 3 completed shifts: In: -  Out: 2500 [Other:2500]   Intake/Output this shift:  No intake/output data recorded.  Physical Exam: General: NAD,   Head: Normocephalic, atraumatic. Moist oral mucosal membranes  Eyes: Anicteric, PERRL  Neck: Supple, trachea midline  Lungs:  Clear to auscultation  Heart: Regular rate and rhythm  Abdomen:  Soft, nontender,   Extremities:  + peripheral edema.  Neurologic: Nonfocal, moving all four extremities  Skin: No lesions  Access: Right AVF    Basic Metabolic Panel: Recent Labs  Lab 05/02/23 1230 05/03/23 0713  NA 136 135  K 5.0 3.7  CL 96* 95*  CO2 27 29  GLUCOSE 163* 224*  BUN 42* 25*  CREATININE 8.16* 5.43*  CALCIUM 9.9 8.7*     Liver Function Tests: No results for input(s): "AST", "ALT", "ALKPHOS", "BILITOT", "PROT", "ALBUMIN" in the last 168 hours. No results for input(s): "LIPASE", "AMYLASE" in the last 168 hours. No results for input(s): "AMMONIA" in the last 168 hours.  CBC: Recent Labs  Lab 05/02/23 1230 05/03/23 0713  WBC 7.7 7.2  HGB 11.7* 10.9*  HCT 36.0 33.9*  MCV 92.1 93.1  PLT 187 186     Cardiac Enzymes: No results for input(s): "CKTOTAL", "CKMB", "CKMBINDEX", "TROPONINI" in the last 168 hours.  BNP: Invalid input(s): "POCBNP"  CBG: Recent Labs  Lab  05/02/23 2307 05/03/23 0753  GLUCAP 124* 202*    Microbiology: Results for orders placed or performed during the hospital encounter of 05/02/23  SARS Coronavirus 2 by RT PCR (hospital order, performed in [facility] hospital lab) *cepheid single result test* Anterior Nasal Swab     Status: None   Collection Time: 05/02/23  3:46 PM   Specimen: Anterior Nasal Swab  Result Value Ref Range Status   SARS Coronavirus 2 by RT PCR NEGATIVE NEGATIVE Final    Comment: (NOTE) SARS-CoV-2 target nucleic acids are NOT DETECTED.  The SARS-CoV-2 RNA is generally detectable in upper and lower respiratory specimens during the acute phase of infection. The lowest concentration of SARS-CoV-2 viral copies this assay can detect is 250 copies / mL. A negative result does not preclude SARS-CoV-2 infection and should not be used as the sole basis for treatment or other patient management decisions.  A negative result may occur with improper specimen collection / handling, submission of specimen other than nasopharyngeal swab, presence of viral mutation(s) within the areas targeted by this assay, and inadequate number of viral copies (<250 copies / mL). A negative result must be combined with clinical observations, patient history, and epidemiological information.  Fact Sheet for Patients:   RoadLapTop.co.za  Fact Sheet for Healthcare Providers: http://kim-miller.com/  This test is not yet approved or  cleared by the [place] FDA and has been authorized for detection  and/or diagnosis of SARS-CoV-2 by FDA under an Emergency Use Authorization (EUA).  This EUA will remain in effect (meaning this test can be used) for the duration of the COVID-19 declaration under Section 564(b)(1) of the Act, 21 U.S.C. section 360bbb-3(b)(1), unless the authorization is terminated or revoked sooner.  Performed at [facility], [address]  [place], [place] [address]     Coagulation Studies: No results for input(s): "LABPROT", "INR" in the last 72 hours.  Urinalysis: No results for input(s): "COLORURINE", "LABSPEC", "PHURINE", "GLUCOSEU", "HGBUR", "BILIRUBINUR", "KETONESUR", "PROTEINUR", "UROBILINOGEN", "NITRITE", "LEUKOCYTESUR" in the last 72 hours.  Invalid input(s): "APPERANCEUR"    Imaging: DG Chest 2 View  Result Date: 05/02/2023 CLINICAL DATA:  Shortness of breath EXAM: CHEST - 2 VIEW COMPARISON:  09/29/2022 FINDINGS: Cardiomegaly status post sternotomy and CABG. Pulmonary vascular congestion. Diffuse bilateral interstitial prominence. No pleural effusion or pneumothorax. IMPRESSION: Cardiomegaly with pulmonary vascular congestion and interstitial edema. Electronically Signed   By: Duanne Guess D.O.   On: 05/02/2023 12:36     Medications:     heparin  5,000 Units Subcutaneous Q8H   insulin aspart  0-5 Units Subcutaneous QHS   insulin aspart  0-6 Units Subcutaneous TID WC   acetaminophen **OR** acetaminophen, ondansetron **OR** ondansetron (ZOFRAN) IV, senna-docusate  Assessment/ Plan:  [name] is a 62 y.o. black female with end stage renal disease on hemodialysis, hypertension, diabetes mellitus type II, diabetic retinopathy, asthma, history of angioedema, COPD, congestive heart failure status post CABG, depression, DVT, coronary artery disease, pituitary adenoma and GERD who presents to [facility] on 05/02/2023 for Shortness of breath [R06.02]  [facility] MWF [facility] Right AVF 73.5kg.  End Stage Renal Disease: with volume overload - Continue MWF schedule. No indication for dialysis today.   Hypertension with chronic kidney disease: Volume driven. Home regimen of amlodipine, carvedilol, furosemide, isosorbide mononitrate.   Anemia with chronic kidney disease: Venofer and Mircera as outpatient.   Secondary Hyperparathyroidism: not currently on binders.    LOS: 0 [name] 5/14/202412:21 PM

## 2023-05-03 NOTE — ED Notes (Signed)
Pt 100% on 2L [place] at this time. Oxygen turned off to trial pt to room air. Pt on monitor, VSS, WCTM.

## 2023-05-04 DIAGNOSIS — Z992 Dependence on renal dialysis: Secondary | ICD-10-CM | POA: Diagnosis not present

## 2023-05-04 DIAGNOSIS — N186 End stage renal disease: Secondary | ICD-10-CM | POA: Diagnosis not present

## 2023-05-04 DIAGNOSIS — N2581 Secondary hyperparathyroidism of renal origin: Secondary | ICD-10-CM | POA: Diagnosis not present

## 2023-05-04 LAB — HEPATITIS B SURFACE ANTIBODY, QUANTITATIVE: Hep B S AB Quant (Post): 42.4 m[IU]/mL (ref >9.9)

## 2023-05-05 NOTE — Discharge Summary (Signed)
Physician Discharge Summary   Patient: Terry Arroyo MRN: [id number] DOB: 1961-11-14  Admit date:     05/02/2023  Discharge date: 05/03/2023  Discharge Physician: Delfino Lovett   PCP: [name], MD   Recommendations at discharge:    F/up with outpt providers as requested  Discharge Diagnoses: Principal Problem:   Shortness of breath Active Problems:   Essential hypertension   End-stage renal disease on hemodialysis (HCC)   Dyslipidemia   Type 2 diabetes mellitus with hyperlipidemia (HCC)   Elevated troponin   CAD (coronary artery disease)   Aortic atherosclerosis (HCC)   Depression   Acute pulmonary edema (HCC)   History of DVT (deep vein thrombosis)  Hospital Course: [name] is a 62 year old female with history of hypertension, hyperlipidemia, neuropathy, insulin-dependent diabetes mellitus, end-stage renal disease on hemodialysis, MWF, who presents emergency department from dialysis center for chief concerns of shortness of breath.  Vitals in the ED showed temperature of 97.8, respiration rate of 18, heart rate of 57, blood pressure 179/77, SpO2 100% on 2 L nasal cannula.  Serum sodium is 136, potassium 5.0, chloride 96, bicarb 27, BUN of 42, serum creatinine of 8.16, EGFR 5, nonfasting blood glucose 163, WBC 7.7, hemoglobin 11.7, platelets 187.  High sensitive troponin is 32.  EDP consulted nephrology, patient will be taken to dialysis.  ED treatment: None  Assessment and Plan: * Shortness of breath due to flash pulmonary edema End-stage renal disease on hemodialysis (HCC) - MWF secondary to pulmonary edema in setting of end-stage renal disease on hemodialysis due for her dialysis on day of admission She underwent urgent HD and was back to her baseline.  Type 2 diabetes mellitus with hyperlipidemia (HCC)          Consultants: Nephro Procedures performed: HD  Disposition: Home Diet recommendation:  Discharge Diet Orders (From admission,  onward)     Start     Ordered   05/03/23 0000  Diet - low sodium heart healthy        05/03/23 0932           Renal diet DISCHARGE MEDICATION: Allergies as of 05/03/2023       Reactions   Hydralazine Hives, Shortness Of Breath, Swelling, Rash   Body aches   Kiwi Extract Hives, Swelling, Rash   Mouth swells    Lisinopril Swelling   Angioedema   Shellfish Allergy Anaphylaxis   Shrimp/lobster  Betadine leaves welts on skin    Betadine [povidone Iodine] Hives, Rash   LEAVES WELTS ON SKIN   Septra [sulfamethoxazole-trimethoprim]    Metformin Diarrhea, Other (See Comments)   GI Intolerance   Tape Itching   Use paper tape whenever possible        Medication List     STOP taking these medications    HYDROcodone-acetaminophen 5-325 MG tablet Commonly known as: Norco   levofloxacin 500 MG tablet Commonly known as: LEVAQUIN       TAKE these medications    acetaminophen 500 MG tablet Commonly known as: TYLENOL Take 1,000 mg by mouth every 6 (six) hours as needed for mild pain or moderate pain.   albuterol 108 (90 Base) MCG/ACT inhaler Commonly known as: VENTOLIN HFA Inhale 2 puffs into the lungs every 6 (six) hours as needed for wheezing or shortness of breath.   amLODipine 10 MG tablet Commonly known as: NORVASC Take 1 tablet (10 mg total) by mouth daily.   aspirin EC 81 MG tablet Take 1 tablet (81 mg  total) by mouth daily.   atorvastatin 80 MG tablet Commonly known as: LIPITOR Take 1 tablet (80 mg total) by mouth daily at 6 PM.   calcium acetate 667 MG capsule Commonly known as: PHOSLO Take by mouth.   carvedilol 25 MG tablet Commonly known as: COREG Take 25 mg by mouth 2 (two) times daily.   cyanocobalamin 1000 MCG tablet Commonly known as: VITAMIN B12 Take 1,000 mcg by mouth daily.   famotidine 40 MG tablet Commonly known as: PEPCID Take 1 tablet by mouth as needed for heartburn.   furosemide 80 MG tablet Commonly known as: LASIX Take 80  mg by mouth 4 (four) times a week. Tues, Thurs, Sat, and Sun (non-dialysis days)   gabapentin 100 MG capsule Commonly known as: NEURONTIN Take 100 mg by mouth daily as needed (pain).   insulin aspart protamine- aspart (70-30) 100 UNIT/ML injection Commonly known as: NOVOLOG MIX 70/30 Inject 0.15 mLs (15 Units total) into the skin 2 (two) times daily with a meal. What changed: when to take this   lidocaine-prilocaine cream Commonly known as: EMLA Apply 1 Application topically See admin instructions. Used on Mon Wed and Fri   loperamide 2 MG capsule Commonly known as: IMODIUM Take 2 mg by mouth daily.   mirtazapine 30 MG tablet Commonly known as: REMERON Take 30 mg by mouth at bedtime.   polyethylene glycol-electrolytes 420 g solution Commonly known as: NuLYTELY Take 4,000 mLs by mouth daily as needed.   Rena-Vite Rx 1 MG Tabs Take 1 tablet by mouth daily.   riboflavin 100 MG Tabs tablet Commonly known as: VITAMIN B-2 Take 2 tablets by mouth daily.   topiramate 25 MG tablet Commonly known as: TOPAMAX Take 25 mg by mouth daily.   traZODone 50 MG tablet Commonly known as: DESYREL Take 0.5 tablets (25 mg total) by mouth at bedtime. What changed: how much to take   True Metrix Blood Glucose Test test strip Generic drug: glucose blood   True Metrix Meter w/Device Kit        Follow-up Information     [name], MD. Schedule an appointment as soon as possible for a visit in 1 week(s).   Specialty: Internal Medicine Why: [facility] Discharge F/UP Contact information: [address] [place] [address] [phone]                Discharge Exam: Ceasar Mons Weights   05/02/23 1658 05/02/23 1753 05/02/23 2156  Weight: 79.7 kg 79.7 kg 77.2 kg   Constitutional: appears older than chronological age, frail, calm Neck: normal, supple, no masses, no thyromegaly Respiratory: clear to auscultation  bilaterally Cardiovascular: Regular rate and rhythm Abdomen: Soft, benign Neurologic: Sensation intact. Strength 5/5 in all 4.  Psychiatric: Normal judgment and insight. Alert and oriented x 3. Normal mood.   Condition at discharge: good  The results of significant diagnostics from this hospitalization (including imaging, microbiology, ancillary and laboratory) are listed below for reference.   Imaging Studies: DG Chest 2 View  Result Date: 05/02/2023 CLINICAL DATA:  Shortness of breath EXAM: CHEST - 2 VIEW COMPARISON:  09/29/2022 FINDINGS: Cardiomegaly status post sternotomy and CABG. Pulmonary vascular congestion. Diffuse bilateral interstitial prominence. No pleural effusion or pneumothorax. IMPRESSION: Cardiomegaly with pulmonary vascular congestion and interstitial edema. Electronically Signed   By: Duanne Guess D.O.   On: 05/02/2023 12:36    Microbiology: Results for orders placed or performed during the hospital encounter of 05/02/23  SARS  Coronavirus 2 by RT PCR (hospital order, performed in [facility] hospital lab) *cepheid single result test* Anterior Nasal Swab     Status: None   Collection Time: 05/02/23  3:46 PM   Specimen: Anterior Nasal Swab  Result Value Ref Range Status   SARS Coronavirus 2 by RT PCR NEGATIVE NEGATIVE Final    Comment: (NOTE) SARS-CoV-2 target nucleic acids are NOT DETECTED.  The SARS-CoV-2 RNA is generally detectable in upper and lower respiratory specimens during the acute phase of infection. The lowest concentration of SARS-CoV-2 viral copies this assay can detect is 250 copies / mL. A negative result does not preclude SARS-CoV-2 infection and should not be used as the sole basis for treatment or other patient management decisions.  A negative result may occur with improper specimen collection / handling, submission of specimen other than nasopharyngeal swab, presence of viral mutation(s) within the areas targeted by this assay, and  inadequate number of viral copies (<250 copies / mL). A negative result must be combined with clinical observations, patient history, and epidemiological information.  Fact Sheet for Patients:   RoadLapTop.co.za  Fact Sheet for Healthcare Providers: http://kim-miller.com/  This test is not yet approved or  cleared by the [place] FDA and has been authorized for detection and/or diagnosis of SARS-CoV-2 by FDA under an Emergency Use Authorization (EUA).  This EUA will remain in effect (meaning this test can be used) for the duration of the COVID-19 declaration under Section 564(b)(1) of the Act, 21 U.S.C. section 360bbb-3(b)(1), unless the authorization is terminated or revoked sooner.  Performed at [facility], [address] [place], [place] [address]     Labs: CBC: Recent Labs  Lab 05/02/23 1230 05/03/23 0713  WBC 7.7 7.2  HGB 11.7* 10.9*  HCT 36.0 33.9*  MCV 92.1 93.1  PLT 187 186   Basic Metabolic Panel: Recent Labs  Lab 05/02/23 1230 05/03/23 0713  NA 136 135  K 5.0 3.7  CL 96* 95*  CO2 27 29  GLUCOSE 163* 224*  BUN 42* 25*  CREATININE 8.16* 5.43*  CALCIUM 9.9 8.7*   Liver Function Tests: No results for input(s): "AST", "ALT", "ALKPHOS", "BILITOT", "PROT", "ALBUMIN" in the last 168 hours. CBG: Recent Labs  Lab 05/02/23 2307 05/03/23 0753  GLUCAP 124* 202*    Discharge time spent: greater than 30 minutes.  Signed: Delfino Lovett, MD [facility] 05/05/2023

## 2023-05-06 ENCOUNTER — Encounter
Admission: RE | Admit: 2023-05-06 | Discharge: 2023-05-06 | Disposition: A | Discharge status: Home / Self Care | Payer: Medicare HMO | Source: Ambulatory Visit | Attending: Gastroenterology | Admitting: Gastroenterology

## 2023-05-06 DIAGNOSIS — R197 Diarrhea, unspecified: Secondary | ICD-10-CM

## 2023-05-06 DIAGNOSIS — R112 Nausea with vomiting, unspecified: Secondary | ICD-10-CM | POA: Diagnosis not present

## 2023-05-06 MED ORDER — TECHNETIUM TC 99M SULFUR COLLOID
2.2300 | Freq: Once | INTRAVENOUS | Status: AC | PRN
  Administered 2023-05-06: 2.23 via ORAL

## 2023-05-07 DIAGNOSIS — N186 End stage renal disease: Secondary | ICD-10-CM | POA: Diagnosis not present

## 2023-05-07 DIAGNOSIS — Z992 Dependence on renal dialysis: Secondary | ICD-10-CM | POA: Diagnosis not present

## 2023-05-07 DIAGNOSIS — N2581 Secondary hyperparathyroidism of renal origin: Secondary | ICD-10-CM | POA: Diagnosis not present

## 2023-05-09 DIAGNOSIS — Z992 Dependence on renal dialysis: Secondary | ICD-10-CM | POA: Diagnosis not present

## 2023-05-09 DIAGNOSIS — N186 End stage renal disease: Secondary | ICD-10-CM | POA: Diagnosis not present

## 2023-05-09 DIAGNOSIS — N2581 Secondary hyperparathyroidism of renal origin: Secondary | ICD-10-CM | POA: Diagnosis not present

## 2023-05-10 DIAGNOSIS — H43391 Other vitreous opacities, right eye: Secondary | ICD-10-CM | POA: Diagnosis not present

## 2023-05-10 DIAGNOSIS — H47292 Other optic atrophy, left eye: Secondary | ICD-10-CM | POA: Diagnosis not present

## 2023-05-10 DIAGNOSIS — E113393 Type 2 diabetes mellitus with moderate nonproliferative diabetic retinopathy without macular edema, bilateral: Secondary | ICD-10-CM | POA: Diagnosis not present

## 2023-05-11 DIAGNOSIS — Z992 Dependence on renal dialysis: Secondary | ICD-10-CM | POA: Diagnosis not present

## 2023-05-11 DIAGNOSIS — N186 End stage renal disease: Secondary | ICD-10-CM | POA: Diagnosis not present

## 2023-05-11 DIAGNOSIS — N2581 Secondary hyperparathyroidism of renal origin: Secondary | ICD-10-CM | POA: Diagnosis not present

## 2023-05-12 ENCOUNTER — Encounter: Payer: Medicare HMO | Admitting: *Deleted

## 2023-05-12 DIAGNOSIS — E1142 Type 2 diabetes mellitus with diabetic polyneuropathy: Secondary | ICD-10-CM | POA: Diagnosis not present

## 2023-05-12 DIAGNOSIS — N186 End stage renal disease: Secondary | ICD-10-CM | POA: Diagnosis not present

## 2023-05-12 DIAGNOSIS — Z992 Dependence on renal dialysis: Secondary | ICD-10-CM | POA: Diagnosis not present

## 2023-05-12 DIAGNOSIS — E1122 Type 2 diabetes mellitus with diabetic chronic kidney disease: Secondary | ICD-10-CM | POA: Diagnosis not present

## 2023-05-13 DIAGNOSIS — N186 End stage renal disease: Secondary | ICD-10-CM | POA: Diagnosis not present

## 2023-05-13 DIAGNOSIS — N2581 Secondary hyperparathyroidism of renal origin: Secondary | ICD-10-CM | POA: Diagnosis not present

## 2023-05-13 DIAGNOSIS — Z992 Dependence on renal dialysis: Secondary | ICD-10-CM | POA: Diagnosis not present

## 2023-05-16 DIAGNOSIS — N2581 Secondary hyperparathyroidism of renal origin: Secondary | ICD-10-CM | POA: Diagnosis not present

## 2023-05-16 DIAGNOSIS — Z992 Dependence on renal dialysis: Secondary | ICD-10-CM | POA: Diagnosis not present

## 2023-05-16 DIAGNOSIS — N186 End stage renal disease: Secondary | ICD-10-CM | POA: Diagnosis not present

## 2023-05-18 DIAGNOSIS — N186 End stage renal disease: Secondary | ICD-10-CM | POA: Diagnosis not present

## 2023-05-18 DIAGNOSIS — Z992 Dependence on renal dialysis: Secondary | ICD-10-CM | POA: Diagnosis not present

## 2023-05-18 DIAGNOSIS — N2581 Secondary hyperparathyroidism of renal origin: Secondary | ICD-10-CM | POA: Diagnosis not present

## 2023-05-19 ENCOUNTER — Ambulatory Visit: Payer: Self-pay | Admitting: *Deleted

## 2023-05-19 NOTE — Patient Outreach (Signed)
  Care Coordination   Follow Up Visit Note   05/19/2023 Name: Terry Arroyo MRN: [id number] DOB: 02/20/1961  Terry Arroyo is a 62 y.o. year old female who sees [name], MD for primary care. I spoke with  [name] by phone today.  What matters to the patients health and wellness today?  Secure transportation for colonoscopy    Goals Addressed               This Visit's Progress     RNCM:" I need help with my Diabetes" (pt-stated)   On track     Care Coordination Interventions:  Provided education to patient about basic DM disease process.Education provided on a heart healthy/ADA renal diet. She is aware of proper diet, still struggles to adhere.   Reviewed medications with patient and discussed importance of medication adherence. Patient is taking medications as directed. Referral to pharmacy team due to request to help with medication management and ability to obtain. Counseled on importance of regular laboratory monitoring as prescribed. Will notify PCP office of need for repeat Discussed plans with patient for ongoing care management follow up and provided patient with direct contact information for care management team. Provided the patient with the RNCM number. Review of how to reach the [facility] and the patient took the [facility] number down again to have on hand.   Provided patient with written educational materials related to hypo and hyperglycemia and importance of correct treatment. Education and support given.  Reviewed scheduled/upcoming provider appointments including: colonoscopy on 6/6.  Advised to need to have someone accompany her to procedure, will have to wait for her and accompany her back home.  She verbalized understanding.  Advised patient, providing education and rationale, to check cbg TID and record, calling pcp or endocrinologist  for findings outside established parameters.  Education provided on the goals of fasting blood sugars of <130 and post  prandial of <180.  Review of patient status, including review of consultants reports, relevant laboratory and other test results, and medications completed           SDOH assessments and interventions completed:  No     Care Coordination Interventions:  Yes, provided   Interventions Today    Flowsheet Row Most Recent Value  Chronic Disease   Chronic disease during today's visit Diabetes, Chronic Kidney Disease/End Stage Renal Disease (ESRD)  [Was seen in ED on 5/13 for shortness of breath, state nephrology is thinking about increasing HD to 4 days a week]  General Interventions   General Interventions Discussed/Reviewed General Interventions Reviewed, Doctor Visits, Labs  Labs Hgb A1c every 3 months  [Most recent A1C is 6.8]  Doctor Visits Discussed/Reviewed Doctor Visits Reviewed, Specialist  [Has colonoscopy scheduled for 6/6, she is still finding transportation, state she is confident that she will and will call with questions/concerns]  PCP/Specialist Visits Compliance with follow-up visit        Follow up plan: Follow up call scheduled for 6/24    Encounter Outcome:  Pt. Visit Completed   Kemper Durie, RN, MSN, [facility] Care Management Care Management Coordinator [phone]

## 2023-05-20 DIAGNOSIS — N186 End stage renal disease: Secondary | ICD-10-CM | POA: Diagnosis not present

## 2023-05-20 DIAGNOSIS — Z992 Dependence on renal dialysis: Secondary | ICD-10-CM | POA: Diagnosis not present

## 2023-05-20 DIAGNOSIS — N2581 Secondary hyperparathyroidism of renal origin: Secondary | ICD-10-CM | POA: Diagnosis not present

## 2023-05-23 DIAGNOSIS — Z992 Dependence on renal dialysis: Secondary | ICD-10-CM | POA: Diagnosis not present

## 2023-05-23 DIAGNOSIS — N2581 Secondary hyperparathyroidism of renal origin: Secondary | ICD-10-CM | POA: Diagnosis not present

## 2023-05-23 DIAGNOSIS — N186 End stage renal disease: Secondary | ICD-10-CM | POA: Diagnosis not present

## 2023-05-24 DIAGNOSIS — R32 Unspecified urinary incontinence: Secondary | ICD-10-CM | POA: Diagnosis not present

## 2023-05-25 DIAGNOSIS — N186 End stage renal disease: Secondary | ICD-10-CM | POA: Diagnosis not present

## 2023-05-25 DIAGNOSIS — N2581 Secondary hyperparathyroidism of renal origin: Secondary | ICD-10-CM | POA: Diagnosis not present

## 2023-05-25 DIAGNOSIS — Z992 Dependence on renal dialysis: Secondary | ICD-10-CM | POA: Diagnosis not present

## 2023-05-25 NOTE — H&P (Signed)
Pre-Procedure H&P   Patient ID: Terry Arroyo is a 62 y.o. female.  Gastroenterology Provider: Jaynie Collins, DO  Referring Provider: Tawni Pummel, PA PCP: [name], MD  Date: 05/26/2023  HPI Terry Arroyo is a 62 y.o. female who presents today for Colonoscopy for Diarrhea . Patient with a history end-stage renal disease on dialysis, DM2, CHF, pituitary tumor (1998), CAD status post open heart surgery in 2018, COPD, anemia of chronic disease, brain aneurysm, left eye blindness who was seen in the office in April for diarrhea over the last several months. She reports 3-4 liquid bowel movements per day without melena or hematochezia.  These are occasionally dark, however, the patient is on Kaopectate.  She does note nocturnal diarrhea.  She has not had any medication changes or dose changes.  She has abdominal cramping prior to bowel movement and is resolved with the bowel movement itself.  Since being seen in the office her symptoms have improved without intervention. She stopped kaopectate and her stools are no longer dark. HD yesterday.  Infectious workup was negative.  CT in January 2023 only demonstrated sigmoid diverticulosis. Hemoglobin 10.8 MCV 93 platelets 286,000    Past Medical History:  Diagnosis Date   Anemia    Angina at rest    Angioedema 03/20/2021   Lisinopril   Anterior cerebral aneurysm    approximately 5 mm; left MCA    Anxiety    Aortic atherosclerosis (HCC)    Asthma    Blind left eye    CHF (congestive heart failure) (HCC)    COPD (chronic obstructive pulmonary disease) (HCC)    Coronary artery disease    Depression    DOE (dyspnea on exertion)    ESRD (end stage renal disease) (HCC)    Gait instability    Genital herpes    GERD (gastroesophageal reflux disease)    HFrEF (heart failure with reduced ejection fraction) (HCC)    History of 2019 novel coronavirus disease (COVID-19) 03/2019   [facility]   History of DVT (deep vein thrombosis)    History of DVT (deep vein thrombosis)    Hx of CABG 2018   x 4; performed in [place]   Hyperlipidemia    Hypertension    MI (myocardial infarction) (HCC) 2018   no stents   Pituitary adenoma (HCC)    PVD (posterior vitreous detachment), right eye    Secondary hyperparathyroidism (HCC)    Sleep apnea    non-compliant with nocturnal PAP therapy   Stroke [facility]) 2014   stated affected left side.   T2DM (type 2 diabetes mellitus) [facility])     Past Surgical History:  Procedure Laterality Date   A/V FISTULAGRAM Right 12/29/2021   Procedure: A/V FISTULAGRAM;  Surgeon: Renford Dills, MD;  Location: [facility] INVASIVE CV LAB;  Service: Cardiovascular;  Laterality: Right;   A/V FISTULAGRAM Right 06/15/2022   Procedure: A/V Fistulagram;  Surgeon: Renford Dills, MD;  Location: [facility];  Service: Cardiovascular;  Laterality: Right;   AV FISTULA PLACEMENT Right 01/28/2021   Procedure: ARTERIOVENOUS (AV) FISTULA CREATION;  Surgeon: Renford Dills, MD;  Location: [facility] ORS;  Service: Vascular;  Laterality: Right;   AV FISTULA PLACEMENT Right 05/13/2021   Procedure: INSERTION OF ARTERIOVENOUS (AV) GORE-TEX GRAFT ARM ( BRACHIAL AXILLARY );  Surgeon: Renford Dills, MD;  Location: [facility] ORS;  Service: Vascular;  Laterality: Right;   BRAIN SURGERY  1986  pituitary tumor   BREAST EXCISIONAL BIOPSY Left yrs ago   benign   CESAREAN SECTION     CORONARY ARTERY BYPASS GRAFT  2018   x 4 vessels; performed in [place]   DIALYSIS/PERMA CATHETER INSERTION N/A 11/07/2020   Procedure: DIALYSIS/PERMA CATHETER INSERTION;  Surgeon: Annice Needy, MD;  Location: [facility];  Service: Cardiovascular;  Laterality: N/A;   DIALYSIS/PERMA CATHETER REMOVAL N/A 06/12/2021   Procedure: DIALYSIS/PERMA CATHETER REMOVAL;  Surgeon: Annice Needy, MD;  Location: [facility];  Service: Cardiovascular;  Laterality: N/A;    ESOPHAGOGASTRODUODENOSCOPY (EGD) WITH PROPOFOL N/A 12/22/2021   Procedure: ESOPHAGOGASTRODUODENOSCOPY (EGD) WITH PROPOFOL;  Surgeon: Regis Bill, MD;  Location: [facility] ENDOS[facility];  Service: Gastroenterology;  Laterality: N/A;  IDDM   EYE SURGERY Right 2021   cataracts    PERIPHERAL VASCULAR THROMBECTOMY Right 04/03/2021   Procedure: A/V Fistulagram ;  Surgeon: Renford Dills, MD;  Location: [facility];  Service: Cardiovascular;  Laterality: Right;    Family History No h/o GI disease or malignancy  Review of Systems  Constitutional:  Negative for activity change, appetite change, chills, diaphoresis, fatigue, fever and unexpected weight change.  HENT:  Negative for trouble swallowing and voice change.   Respiratory:  Negative for shortness of breath and wheezing.   Cardiovascular:  Negative for chest pain, palpitations and leg swelling.  Gastrointestinal:  Positive for abdominal pain and diarrhea. Negative for abdominal distention, anal bleeding, blood in stool, constipation, nausea, rectal pain and vomiting.  Musculoskeletal:  Negative for arthralgias and myalgias.  Skin:  Negative for color change and pallor.  Neurological:  Negative for dizziness, syncope and weakness.  Psychiatric/Behavioral:  Negative for confusion.   All other systems reviewed and are negative.    Medications No current facility-administered medications on file prior to encounter.   Current Outpatient Medications on File Prior to Encounter  Medication Sig Dispense Refill   amLODipine (NORVASC) 10 MG tablet Take 1 tablet (10 mg total) by mouth daily. 30 tablet 0   aspirin 81 MG EC tablet Take 1 tablet (81 mg total) by mouth daily. 30 tablet 0   atorvastatin (LIPITOR) 80 MG tablet Take 1 tablet (80 mg total) by mouth daily at 6 PM. 30 tablet 0   carvedilol (COREG) 25 MG tablet Take 25 mg by mouth 2 (two) times daily.     mirtazapine (REMERON) 30 MG tablet Take 30 mg by mouth at bedtime.      riboflavin (VITAMIN B-2) 100 MG TABS tablet Take 2 tablets by mouth daily.     topiramate (TOPAMAX) 25 MG tablet Take 25 mg by mouth daily.     vitamin B-12 (CYANOCOBALAMIN) 1000 MCG tablet Take 1,000 mcg by mouth daily.     acetaminophen (TYLENOL) 500 MG tablet Take 1,000 mg by mouth every 6 (six) hours as needed for mild pain or moderate pain.     albuterol (VENTOLIN HFA) 108 (90 Base) MCG/ACT inhaler Inhale 2 puffs into the lungs every 6 (six) hours as needed for wheezing or shortness of breath.     Blood Glucose Monitoring Suppl (TRUE METRIX METER) w/Device KIT      calcium acetate (PHOSLO) 667 MG capsule Take by mouth.     famotidine (PEPCID) 40 MG tablet Take 1 tablet by mouth as needed for heartburn.     furosemide (LASIX) 80 MG tablet Take 80 mg by mouth 4 (four) times a week. Tues, Thurs, Sat, and Sun (non-dialysis days)  gabapentin (NEURONTIN) 100 MG capsule Take 100 mg by mouth daily as needed (pain).     insulin aspart protamine- aspart (NOVOLOG MIX 70/30) (70-30) 100 UNIT/ML injection Inject 0.15 mLs (15 Units total) into the skin 2 (two) times daily with a meal. (Patient taking differently: Inject 15 Units into the skin 3 (three) times daily with meals.) 10 mL 0   loperamide (IMODIUM) 2 MG capsule Take 2 mg by mouth daily.     traZODone (DESYREL) 50 MG tablet Take 0.5 tablets (25 mg total) by mouth at bedtime. (Patient taking differently: Take 100 mg by mouth at bedtime.)     TRUE METRIX BLOOD GLUCOSE TEST test strip       Pertinent medications related to GI and procedure were reviewed by me with the patient prior to the procedure   Current Facility-Administered Medications:    0.9 %  sodium chloride infusion, , Intravenous, Continuous, Jaynie Collins, DO  sodium chloride         Allergies  Allergen Reactions   Hydralazine Hives, Shortness Of Breath, Swelling and Rash    Body aches    Kiwi Extract Hives, Swelling and Rash    Mouth swells    Lisinopril Swelling     Angioedema   Shellfish Allergy Anaphylaxis    Shrimp/lobster  Betadine leaves welts on skin     Betadine [Povidone Iodine] Hives and Rash    LEAVES WELTS ON SKIN   Septra [Sulfamethoxazole-Trimethoprim]    Metformin Diarrhea and Other (See Comments)    GI Intolerance   Tape Itching    Use paper tape whenever possible   Allergies were reviewed by me prior to the procedure  Objective   Body mass index is 25.95 kg/m. Vitals:   05/26/23 0958  BP: (!) 148/72  Pulse: 65  Resp: 17  Temp: (!) 97 F (36.1 C)  TempSrc: Temporal  SpO2: 99%  Weight: 72.9 kg  Height: 5\' 6"  (1.676 m)     Physical Exam Vitals and nursing note reviewed.  Constitutional:      General: She is not in acute distress.    Appearance: Normal appearance. She is not ill-appearing, toxic-appearing or diaphoretic.  HENT:     Head: Normocephalic and atraumatic.     Nose: Nose normal.     Mouth/Throat:     Mouth: Mucous membranes are moist.     Pharynx: Oropharynx is clear.  Eyes:     General: No scleral icterus.    Extraocular Movements: Extraocular movements intact.     Comments: L eye blindness  Cardiovascular:     Rate and Rhythm: Normal rate and regular rhythm.     Heart sounds:     No gallop.  Pulmonary:     Effort: Pulmonary effort is normal. No respiratory distress.     Breath sounds: Normal breath sounds. No wheezing, rhonchi or rales.  Abdominal:     General: Abdomen is flat. Bowel sounds are normal. There is no distension.     Palpations: Abdomen is soft.     Tenderness: There is no abdominal tenderness. There is no guarding or rebound.  Musculoskeletal:     Cervical back: Neck supple.     Right lower leg: No edema.     Left lower leg: No edema.  Skin:    General: Skin is warm and dry.     Coloration: Skin is not jaundiced or pale.     Comments: AVF RUE  Neurological:     General: No  focal deficit present.     Mental Status: She is alert and oriented to person, place, and time.  Mental status is at baseline.  Psychiatric:        Mood and Affect: Mood normal.        Behavior: Behavior normal.        Thought Content: Thought content normal.        Judgment: Judgment normal.      Assessment:  Terry Arroyo is a 62 y.o. female  who presents today for Colonoscopy for Diarrhea .  Plan:  Colonoscopy with possible intervention today  Colonoscopy with possible biopsy, control of bleeding, polypectomy, and interventions as necessary has been discussed with the patient/patient representative. Informed consent was obtained from the patient/patient representative after explaining the indication, nature, and risks of the procedure including but not limited to death, bleeding, perforation, missed neoplasm/lesions, cardiorespiratory compromise, and reaction to medications. Opportunity for questions was given and appropriate answers were provided. Patient/patient representative has verbalized understanding is amenable to undergoing the procedure.   Jaynie Collins, DO  [facility]  Portions of the record may have been created with voice recognition software. Occasional wrong-word or 'sound-a-like' substitutions may have occurred due to the inherent limitations of voice recognition software.  Read the chart carefully and recognize, using context, where substitutions may have occurred.

## 2023-05-26 ENCOUNTER — Ambulatory Visit
Admission: RE | Admit: 2023-05-26 | Discharge: 2023-05-26 | Disposition: A | Discharge status: Home / Self Care | Payer: Medicare HMO | Attending: Gastroenterology | Admitting: Gastroenterology

## 2023-05-26 ENCOUNTER — Encounter: Payer: Self-pay | Admitting: Gastroenterology

## 2023-05-26 ENCOUNTER — Ambulatory Visit: Payer: Medicare HMO | Admitting: Certified Registered"

## 2023-05-26 ENCOUNTER — Encounter
Admission: RE | Disposition: A | Discharge status: Home / Self Care | Payer: Self-pay | Source: Home / Self Care | Attending: Gastroenterology

## 2023-05-26 DIAGNOSIS — K529 Noninfective gastroenteritis and colitis, unspecified: Secondary | ICD-10-CM | POA: Insufficient documentation

## 2023-05-26 DIAGNOSIS — E1122 Type 2 diabetes mellitus with diabetic chronic kidney disease: Secondary | ICD-10-CM | POA: Diagnosis not present

## 2023-05-26 DIAGNOSIS — N186 End stage renal disease: Secondary | ICD-10-CM | POA: Diagnosis not present

## 2023-05-26 DIAGNOSIS — I5022 Chronic systolic (congestive) heart failure: Secondary | ICD-10-CM | POA: Insufficient documentation

## 2023-05-26 DIAGNOSIS — K5730 Diverticulosis of large intestine without perforation or abscess without bleeding: Secondary | ICD-10-CM | POA: Diagnosis not present

## 2023-05-26 DIAGNOSIS — I509 Heart failure, unspecified: Secondary | ICD-10-CM | POA: Diagnosis not present

## 2023-05-26 DIAGNOSIS — K5289 Other specified noninfective gastroenteritis and colitis: Secondary | ICD-10-CM | POA: Diagnosis not present

## 2023-05-26 DIAGNOSIS — J449 Chronic obstructive pulmonary disease, unspecified: Secondary | ICD-10-CM | POA: Insufficient documentation

## 2023-05-26 DIAGNOSIS — R197 Diarrhea, unspecified: Secondary | ICD-10-CM | POA: Diagnosis not present

## 2023-05-26 DIAGNOSIS — I2510 Atherosclerotic heart disease of native coronary artery without angina pectoris: Secondary | ICD-10-CM | POA: Diagnosis not present

## 2023-05-26 DIAGNOSIS — I132 Hypertensive heart and chronic kidney disease with heart failure and with stage 5 chronic kidney disease, or end stage renal disease: Secondary | ICD-10-CM | POA: Insufficient documentation

## 2023-05-26 DIAGNOSIS — H5462 Unqualified visual loss, left eye, normal vision right eye: Secondary | ICD-10-CM | POA: Diagnosis not present

## 2023-05-26 DIAGNOSIS — Z992 Dependence on renal dialysis: Secondary | ICD-10-CM | POA: Diagnosis not present

## 2023-05-26 DIAGNOSIS — R1111 Vomiting without nausea: Secondary | ICD-10-CM | POA: Diagnosis not present

## 2023-05-26 DIAGNOSIS — K635 Polyp of colon: Secondary | ICD-10-CM | POA: Diagnosis not present

## 2023-05-26 DIAGNOSIS — D631 Anemia in chronic kidney disease: Secondary | ICD-10-CM | POA: Diagnosis not present

## 2023-05-26 HISTORY — PX: COLONOSCOPY: SHX5424

## 2023-05-26 LAB — POCT I-STAT, CHEM 8
BUN: 18 mg/dL (ref 6–20)
Calcium, Ion: 0.75 mmol/L — CL (ref 1.15–1.40)
Chloride: 104 mmol/L (ref 98–111)
Creatinine, Ser: 5.9 mg/dL — ABNORMAL HIGH (ref 0.44–1.00)
Glucose, Bld: 96 mg/dL (ref 70–99)
HCT: 33 % — ABNORMAL LOW (ref 36.0–46.0)
Hemoglobin: 11.2 g/dL — ABNORMAL LOW (ref 12.0–15.0)
Potassium: 4.5 mmol/L (ref 3.5–5.1)
Sodium: 133 mmol/L — ABNORMAL LOW (ref 135–145)
TCO2: 28 mmol/L (ref 22–32)

## 2023-05-26 LAB — GLUCOSE, CAPILLARY: Glucose-Capillary: 78 mg/dL (ref 70–99)

## 2023-05-26 SURGERY — COLONOSCOPY
Anesthesia: General

## 2023-05-26 MED ORDER — SODIUM CHLORIDE 0.9 % IV SOLN: INTRAVENOUS | Status: DC

## 2023-05-26 MED ORDER — PROPOFOL 10 MG/ML IV BOLUS
INTRAVENOUS | Status: DC | PRN
  Administered 2023-05-26: 80 mg via INTRAVENOUS
  Administered 2023-05-26: 120 ug/kg/min via INTRAVENOUS

## 2023-05-26 MED ORDER — LIDOCAINE HCL (CARDIAC) PF 100 MG/5ML IV SOSY
PREFILLED_SYRINGE | INTRAVENOUS | Status: DC | PRN
  Administered 2023-05-26: 100 mg via INTRAVENOUS

## 2023-05-26 MED ORDER — EPHEDRINE SULFATE (PRESSORS) 50 MG/ML IJ SOLN
INTRAMUSCULAR | Status: DC | PRN
  Administered 2023-05-26: 5 mg via INTRAVENOUS

## 2023-05-26 NOTE — Interval H&P Note (Signed)
History and Physical Interval Note: Preprocedure H&P from 05/26/23  was reviewed and there was no interval change after seeing and examining the patient.  Written consent was obtained from the patient after discussion of risks, benefits, and alternatives. Patient has consented to proceed with Colonoscopy with possible intervention   05/26/2023 10:14 AM  Terry Arroyo  has presented today for surgery, with the diagnosis of  787.91 (ICD-9-CM) - R19.7 (ICD-10-CM) - Diarrhea, unspecified type  787.03 (ICD-9-CM) - R11.11 (ICD-10-CM) - Vomiting without nausea, unspecified vomiting type.  The various methods of treatment have been discussed with the patient and family. After consideration of risks, benefits and other options for treatment, the patient has consented to  Procedure(s) with comments: COLONOSCOPY (N/A) - DIALYSIS as a surgical intervention.  The patient's history has been reviewed, patient examined, no change in status, stable for surgery.  I have reviewed the patient's chart and labs.  Questions were answered to the patient's satisfaction.     Jaynie Collins

## 2023-05-26 NOTE — Anesthesia Postprocedure Evaluation (Signed)
Anesthesia Post Note  Patient: Terry Arroyo  Procedure(s) Performed: COLONOSCOPY  Patient location during evaluation: Endoscopy Anesthesia Type: General Level of consciousness: awake and alert Pain management: pain level controlled Vital Signs Assessment: post-procedure vital signs reviewed and stable Respiratory status: spontaneous breathing, nonlabored ventilation, respiratory function stable and patient connected to nasal cannula oxygen Cardiovascular status: blood pressure returned to baseline and stable Postop Assessment: no apparent nausea or vomiting Anesthetic complications: no   No notable events documented.   Last Vitals:  Vitals:   05/26/23 1132 05/26/23 1142  BP: (!) 141/68 (!) 161/73  Pulse: (!) 59 (!) 57  Resp: 20 20  Temp:    SpO2: 99% 99%    Last Pain:  Vitals:   05/26/23 1142  TempSrc:   PainSc: 0-No pain                 Corinda Gubler

## 2023-05-26 NOTE — Anesthesia Preprocedure Evaluation (Signed)
Anesthesia Evaluation  Patient identified by MRN, date of birth, ID band Patient awake    Reviewed: Allergy & Precautions, NPO status , Patient's Chart, lab work & pertinent test results  History of Anesthesia Complications Negative for: history of anesthetic complications  Airway Mallampati: III  TM Distance: >3 FB Neck ROM: full    Dental  (+) Missing, Poor Dentition, Chipped   Pulmonary asthma , sleep apnea , COPD,  COPD inhaler, former smoker   Pulmonary exam normal        Cardiovascular Exercise Tolerance: Poor hypertension, (-) angina + CAD, + Past MI, + Cardiac Stents, + CABG, + Peripheral Vascular Disease, +CHF and + DOE   Rhythm:Regular Rate:Normal  TTE 2022: 1. Left ventricular ejection fraction, by estimation, is 35 to 40%. The  left ventricle has moderately decreased function. The left ventricle  demonstrates regional wall motion abnormalities (see scoring  diagram/findings for description). There is mild  left ventricular hypertrophy. Left ventricular diastolic parameters are  consistent with Grade II diastolic dysfunction (pseudonormalization).  There is severe hypokinesis of the left ventricular, entire inferolateral  wall.   2. Right ventricular systolic function is mildly reduced. The right  ventricular size is normal.   3. Left atrial size was mild to moderately dilated.   4. Right atrial size was mildly dilated.   5. The mitral valve is degenerative. Mild mitral valve regurgitation.   6. Tricuspid valve regurgitation is mild to moderate.   7. The aortic valve is tricuspid. Aortic valve regurgitation is not  visualized.     Neuro/Psych  Headaches PSYCHIATRIC DISORDERS Anxiety Depression    Residual left sided weakness  Neuromuscular disease CVA, Residual Symptoms    GI/Hepatic Neg liver ROS,GERD  Medicated and Controlled,,  Endo/Other  diabetes, Type 2, Insulin Dependent    Renal/GU Dialysis and  ESRFRenal diseaseLast dialyzed two days ago.  negative genitourinary   Musculoskeletal   Abdominal   Peds  Hematology negative hematology ROS (+)   Anesthesia Other Findings Past Medical History: No date: Anemia No date: Angina at rest [facility]) 03/20/2021: Angioedema     Comment:  Lisinopril No date: Anterior cerebral aneurysm     Comment:  approximately 5 mm; left MCA  No date: Anxiety No date: Aortic atherosclerosis (HCC) No date: Asthma No date: Blind left eye No date: COPD (chronic obstructive pulmonary disease) (HCC) No date: Coronary artery disease No date: Depression No date: DOE (dyspnea on exertion) No date: ESRD (end stage renal disease) (HCC) No date: Gait instability No date: Genital herpes No date: GERD (gastroesophageal reflux disease) No date: HFrEF (heart failure with reduced ejection fraction) (HCC) 03/2019: History of 2019 novel coronavirus disease (COVID-19)     Comment:  [facility] [place] No date: History of DVT (deep vein thrombosis) No date: History of DVT (deep vein thrombosis) 2018: Hx of CABG     Comment:  x 4; performed in [place] No date: Hyperlipidemia No date: Hypertension 2018: MI (myocardial infarction) (HCC)     Comment:  no stents No date: Pituitary adenoma (HCC) No date: PVD (posterior vitreous detachment), right eye No date: Secondary hyperparathyroidism (HCC) No date: Sleep apnea     Comment:  non-compliant with nocturnal PAP therapy 2014: Stroke [facility])     Comment:  stated affected left side. No date: T2DM (type 2 diabetes mellitus) (HCC)  Past Surgical History: 01/28/2021: AV FISTULA PLACEMENT; Right     Comment:  Procedure: ARTERIOVENOUS (AV) FISTULA CREATION;  Surgeon: Renford Dills, MD;  Location: [facility] ORS;                Service: Vascular;  Laterality: Right; 05/13/2021: AV FISTULA PLACEMENT; Right     Comment:  Procedure: INSERTION OF ARTERIOVENOUS (AV) GORE-TEX               GRAFT ARM  ( BRACHIAL AXILLARY );  Surgeon: Renford Dills, MD;  Location: [facility] ORS;  Service: Vascular;                Laterality: Right; 1986: BRAIN SURGERY     Comment:  pituitary tumor yrs ago: BREAST EXCISIONAL BIOPSY; Left     Comment:  benign No date: CESAREAN SECTION 2018: CORONARY ARTERY BYPASS GRAFT     Comment:  x 4 vessels; performed in [place] 11/07/2020: DIALYSIS/PERMA CATHETER INSERTION; N/A     Comment:  Procedure: DIALYSIS/PERMA CATHETER INSERTION;  Surgeon:               Annice Needy, MD;  Location: [facility];                Service: Cardiovascular;  Laterality: N/A; 06/12/2021: DIALYSIS/PERMA CATHETER REMOVAL; N/A     Comment:  Procedure: DIALYSIS/PERMA CATHETER REMOVAL;  Surgeon:               Annice Needy, MD;  Location: [facility];                Service: Cardiovascular;  Laterality: N/A; 2021: EYE SURGERY; Right     Comment:  cataracts  04/03/2021: PERIPHERAL VASCULAR THROMBECTOMY; Right     Comment:  Procedure: A/V Fistulagram ;  Surgeon: Renford Dills, MD;  Location: [facility];  Service:               Cardiovascular;  Laterality: Right;  BMI    Body Mass Index: 27.76 kg/m      Reproductive/Obstetrics negative OB ROS                             Anesthesia Physical Anesthesia Plan  ASA: 4  Anesthesia Plan: General   Post-op Pain Management: Minimal or no pain anticipated   Induction: Intravenous  PONV Risk Score and Plan: 3 and Propofol infusion and TIVA  Airway Management Planned: Natural Airway and Nasal Cannula  Additional Equipment: None  Intra-op Plan:   Post-operative Plan:   Informed Consent: I have reviewed the patients History and Physical, chart, labs and discussed the procedure including the risks, benefits and alternatives for the proposed anesthesia with the patient or authorized representative who has indicated his/her understanding and  acceptance.     Dental Advisory Given  Plan Discussed with: Anesthesiologist, CRNA and Surgeon  Anesthesia Plan Comments: (Patient consented for risks of anesthesia including but not limited to:  - adverse reactions to medications - risk of airway placement if required - damage to eyes, teeth, lips or other oral mucosa - nerve damage due to positioning  - sore throat or hoarseness - Damage to heart, brain, nerves, lungs, other parts of body or loss of life  Patient voiced understanding.)        Anesthesia Quick Evaluation

## 2023-05-26 NOTE — Transfer of Care (Signed)
Immediate Anesthesia Transfer of Care Note  Patient: DAYLE SLOBODA  Procedure(s) Performed: COLONOSCOPY  Patient Location: PACU  Anesthesia Type:General  Level of Consciousness: drowsy  Airway & Oxygen Therapy: Patient Spontanous Breathing  Post-op Assessment: Report given to RN and Post -op Vital signs reviewed and stable  Post vital signs: Reviewed and stable  Last Vitals:  Vitals Value Taken Time  BP 130/73 05/26/23 1122  Temp 35.9 1122  Pulse 57 05/26/23 1122  Resp 16 05/26/23 1122  SpO2 99 % 05/26/23 1122  Vitals shown include unvalidated device data.  Last Pain:  Vitals:   05/26/23 0958  TempSrc: Temporal  PainSc: 0-No pain         Complications: No notable events documented.

## 2023-05-26 NOTE — Op Note (Signed)
[facility] Gastroenterology Patient Name: Terry Arroyo Procedure Date: 05/26/2023 9:53 AM MRN: [id number] Account #: [id number] Date of Birth: [birth year] Admit Type: Outpatient Age: 62 Room: [facility] ENDO ROOM 1 Gender: Female Note Status: Finalized Instrument Name: Colonscope [id number] Procedure:             Colonoscopy Indications:           Chronic diarrhea Providers:             Jaynie Collins DO, DO Medicines:             Monitored Anesthesia Care Complications:         No immediate complications. Estimated blood loss:                         Minimal. Procedure:             Pre-Anesthesia Assessment:                        - Prior to the procedure, a History and Physical was                         performed, and patient medications and allergies were                         reviewed. The patient is competent. The risks and                         benefits of the procedure and the sedation options and                         risks were discussed with the patient. All questions                         were answered and informed consent was obtained.                         Patient identification and proposed procedure were                         verified by the physician, the nurse, the anesthetist                         and the technician in the endoscopy suite. Mental                         Status Examination: alert and oriented. Airway                         Examination: normal oropharyngeal airway and neck                         mobility. Respiratory Examination: clear to                         auscultation. CV Examination: RRR, no murmurs, no S3                         or S4. Prophylactic Antibiotics: The patient does not  require prophylactic antibiotics. Prior                         Anticoagulants: The patient has taken no anticoagulant                         or antiplatelet agents. ASA Grade Assessment: IV - A                          patient with severe systemic disease that is a                         constant threat to life. After reviewing the risks and                         benefits, the patient was deemed in satisfactory                         condition to undergo the procedure. The anesthesia                         plan was to use monitored anesthesia care (MAC).                         Immediately prior to administration of medications,                         the patient was re-assessed for adequacy to receive                         sedatives. The heart rate, respiratory rate, oxygen                         saturations, blood pressure, adequacy of pulmonary                         ventilation, and response to care were monitored                         throughout the procedure. The physical status of the                         patient was re-assessed after the procedure.                        After obtaining informed consent, the colonoscope was                         passed under direct vision. Throughout the procedure,                         the patient's blood pressure, pulse, and oxygen                         saturations were monitored continuously. The                         Colonoscope was introduced through the anus and  advanced to the the terminal ileum, with                         identification of the appendiceal orifice and IC                         valve. The colonoscopy was performed without                         difficulty. The patient tolerated the procedure well.                         The quality of the bowel preparation was evaluated                         using the BBPS [facility] Bowel Preparation Scale) with                         scores of: Right Colon = 2 (minor amount of residual                         staining, small fragments of stool and/or opaque                         liquid, but mucosa seen well), Transverse Colon = 2                          (minor amount of residual staining, small fragments of                         stool and/or opaque liquid, but mucosa seen well) and                         Left Colon = 2 (minor amount of residual staining,                         small fragments of stool and/or opaque liquid, but                         mucosa seen well). The total BBPS score equals 6. The                         quality of the bowel preparation was good. The                         terminal ileum, ileocecal valve, appendiceal orifice,                         and rectum were photographed. Findings:      The perianal and digital rectal examinations were normal. Pertinent       negatives include normal sphincter tone.      Retroflexion in the right colon was performed.      The terminal ileum appeared normal. Estimated blood loss: none.      Three sessile polyps were found in the descending colon (2) and cecum       (1). The polyps were 1 to 2 mm in  size. These polyps were removed with a       jumbo cold forceps. Resection and retrieval were complete. Estimated       blood loss was minimal.      A few small-mouthed diverticula were found in the recto-sigmoid colon       and sigmoid colon. Estimated blood loss: none.      A moderate amount of liquid stool was found in the entire colon,       interfering with visualization. Lavage of the area was performed using a       large amount, resulting in clearance with good visualization. Estimated       blood loss: none.      Normal mucosa was found in the entire colon. Biopsies for histology were       taken with a cold forceps from the right colon and left colon for       evaluation of microscopic colitis. Estimated blood loss was minimal.      The exam was otherwise without abnormality on direct and retroflexion       views. Impression:            - The examined portion of the ileum was normal.                        - Three 1 to 2 mm polyps in the descending  colon and                         in the cecum, removed with a jumbo cold forceps.                         Resected and retrieved.                        - Diverticulosis in the recto-sigmoid colon and in the                         sigmoid colon.                        - Stool in the entire examined colon.                        - Normal mucosa in the entire examined colon. Biopsied.                        - The examination was otherwise normal on direct and                         retroflexion views. Recommendation:        - Patient has a contact number available for                         emergencies. The signs and symptoms of potential                         delayed complications were discussed with the patient.                         Return to normal activities tomorrow. Written  discharge instructions were provided to the patient.                        - Discharge patient to home.                        - Resume previous diet.                        - Continue present medications.                        - Await pathology results.                        - Repeat colonoscopy for surveillance based on                         pathology results.                        - Return to GI office as previously scheduled.                        - If able, see if dialysis can avoid heparin tomorrow                         with hemodialysis and use citrate or alternative                         instead. If they cannot, would not otherwise delay                         dialysis and would proceed with heparin.                        - The findings and recommendations were discussed with                         the patient. Procedure Code(s):     --- Professional ---                        [phone], Colonoscopy, flexible; with biopsy, single or                         multiple Diagnosis Code(s):     --- Professional ---                        D12.4, Benign neoplasm of  descending colon                        D12.0, Benign neoplasm of cecum                        K52.9, Noninfective gastroenteritis and colitis,                         unspecified                        K57.30, Diverticulosis of large intestine without  perforation or abscess without bleeding CPT copyright 2022 American Medical Association. All rights reserved. The codes documented in this report are preliminary and upon coder review may  be revised to meet current compliance requirements. Attending Participation:      I personally performed the entire procedure. Elfredia Nevins, DO Jaynie Collins DO, DO 05/26/2023 11:29:08 AM This report has been signed electronically. Number of Addenda: 0 Note Initiated On: 05/26/2023 9:53 AM Scope Withdrawal Time: 0 hours 20 minutes 4 seconds  Total Procedure Duration: 0 hours 25 minutes 43 seconds  Estimated Blood Loss:  Estimated blood loss was minimal.      [facility]

## 2023-05-27 ENCOUNTER — Encounter: Payer: Self-pay | Admitting: Gastroenterology

## 2023-05-27 DIAGNOSIS — N2581 Secondary hyperparathyroidism of renal origin: Secondary | ICD-10-CM | POA: Diagnosis not present

## 2023-05-27 DIAGNOSIS — N186 End stage renal disease: Secondary | ICD-10-CM | POA: Diagnosis not present

## 2023-05-27 DIAGNOSIS — Z992 Dependence on renal dialysis: Secondary | ICD-10-CM | POA: Diagnosis not present

## 2023-05-30 DIAGNOSIS — Z992 Dependence on renal dialysis: Secondary | ICD-10-CM | POA: Diagnosis not present

## 2023-05-30 DIAGNOSIS — N2581 Secondary hyperparathyroidism of renal origin: Secondary | ICD-10-CM | POA: Diagnosis not present

## 2023-05-30 DIAGNOSIS — N186 End stage renal disease: Secondary | ICD-10-CM | POA: Diagnosis not present

## 2023-06-01 DIAGNOSIS — Z992 Dependence on renal dialysis: Secondary | ICD-10-CM | POA: Diagnosis not present

## 2023-06-01 DIAGNOSIS — N2581 Secondary hyperparathyroidism of renal origin: Secondary | ICD-10-CM | POA: Diagnosis not present

## 2023-06-01 DIAGNOSIS — N186 End stage renal disease: Secondary | ICD-10-CM | POA: Diagnosis not present

## 2023-06-03 DIAGNOSIS — N2581 Secondary hyperparathyroidism of renal origin: Secondary | ICD-10-CM | POA: Diagnosis not present

## 2023-06-03 DIAGNOSIS — Z992 Dependence on renal dialysis: Secondary | ICD-10-CM | POA: Diagnosis not present

## 2023-06-03 DIAGNOSIS — N186 End stage renal disease: Secondary | ICD-10-CM | POA: Diagnosis not present

## 2023-06-06 DIAGNOSIS — Z992 Dependence on renal dialysis: Secondary | ICD-10-CM | POA: Diagnosis not present

## 2023-06-06 DIAGNOSIS — N186 End stage renal disease: Secondary | ICD-10-CM | POA: Diagnosis not present

## 2023-06-06 DIAGNOSIS — N2581 Secondary hyperparathyroidism of renal origin: Secondary | ICD-10-CM | POA: Diagnosis not present

## 2023-06-08 DIAGNOSIS — N186 End stage renal disease: Secondary | ICD-10-CM | POA: Diagnosis not present

## 2023-06-08 DIAGNOSIS — N2581 Secondary hyperparathyroidism of renal origin: Secondary | ICD-10-CM | POA: Diagnosis not present

## 2023-06-08 DIAGNOSIS — Z992 Dependence on renal dialysis: Secondary | ICD-10-CM | POA: Diagnosis not present

## 2023-06-10 DIAGNOSIS — N2581 Secondary hyperparathyroidism of renal origin: Secondary | ICD-10-CM | POA: Diagnosis not present

## 2023-06-10 DIAGNOSIS — N186 End stage renal disease: Secondary | ICD-10-CM | POA: Diagnosis not present

## 2023-06-10 DIAGNOSIS — Z992 Dependence on renal dialysis: Secondary | ICD-10-CM | POA: Diagnosis not present

## 2023-06-13 ENCOUNTER — Ambulatory Visit: Payer: Self-pay | Admitting: *Deleted

## 2023-06-13 DIAGNOSIS — Z992 Dependence on renal dialysis: Secondary | ICD-10-CM | POA: Diagnosis not present

## 2023-06-13 DIAGNOSIS — N2581 Secondary hyperparathyroidism of renal origin: Secondary | ICD-10-CM | POA: Diagnosis not present

## 2023-06-13 DIAGNOSIS — N186 End stage renal disease: Secondary | ICD-10-CM | POA: Diagnosis not present

## 2023-06-13 NOTE — Patient Outreach (Addendum)
Care Coordination   Follow Up Visit Note   06/13/2023 Name: Terry Arroyo MRN: [id number] DOB: 1961/01/06  Terry Arroyo is a 62 y.o. year old female who sees Terry Regulus, MD for primary care. I spoke with  Terry Arroyo by phone today.  What matters to the patients health and wellness today?  Ongoing management of DM    Goals Addressed               This Visit's Progress     COMPLETED: RNCM: "I am having a hard time sleeping" (pt-stated)        Care Coordination Interventions: Evaluation of current treatment plan related to insomnia and patient's adherence to plan as established by provider Advised patient to not take more than prescribed medications for sleep and the RNCM would contact the provider for recommendations Provided education to patient re: how to enhance sleep habits, not watching tv before bedtime, not eating late at night. The patient states she has dialysis therefore she does not stay up late because on dialysis days she is tired. Reviewed medications with patient and discussed The patient states that she takes Remeron 30 mg at bedtime but it is not helping her rest. She ask if the medications could be increased. Education on the max dose being 45 mg and the provider would need to be contacted. Patient has an appointment with the pcp on 10-12-2022.  Provided patient with mindfulness  educational materials related to relaxation Discussed plans with patient for ongoing care management follow up and provided patient with direct contact information for care management team Advised patient to discuss insomnia and concerns with not getting adequate rest with provider   No longer an issue, goal complete      RNCM:" I need help with my Diabetes" (pt-stated)   On track     Care Coordination Interventions:  Provided education to patient about basic DM disease process.Education provided on a heart healthy/ADA renal diet. She is aware of proper diet, still  struggles to adhere.   Reviewed medications with patient and discussed importance of medication adherence. Patient is taking medications as directed. Referral to pharmacy team due to request to help with medication management and ability to obtain. Counseled on importance of regular laboratory monitoring as prescribed. Will have repeat in August Discussed plans with patient for ongoing care management follow up and provided patient with direct contact information for care management team. Provided the patient with the RNCM number. Provided patient with written educational materials related to hypo and hyperglycemia and importance of correct treatment. Education and support given.  Advised patient, providing education and rationale, to check cbg TID and record, calling pcp or endocrinologist  for findings outside established parameters.  Education provided on the goals of fasting blood sugars of <130 and post prandial of <180.  Review of patient status, including review of consultants reports, relevant laboratory and other test results, and medications completed           SDOH assessments and interventions completed:  No     Care Coordination Interventions:  Yes, provided   Interventions Today    Flowsheet Row Most Recent Value  Chronic Disease   Chronic disease during today's visit Diabetes  General Interventions   General Interventions Discussed/Reviewed General Interventions Reviewed, Health Screening, Doctor Visits  Doctor Visits Discussed/Reviewed Doctor Visits Reviewed, PCP, Specialist  [facility] 8/27, Vascular 8/29]  Health Screening Colonoscopy  [Report colonoscopy results were good, no reported concerns]  PCP/Specialist Visits  Compliance with follow-up visit  Education Interventions   Education Provided Provided Education  Provided Verbal Education On Labs, Blood Sugar Monitoring, When to see the doctor  [Reminded to monitor blood sugars daily]  Labs Reviewed Hgb A1c  [Reminded of  need for A1C at next PCP visit]       Follow up plan: Follow up call scheduled for 9/10    Encounter Outcome:  Pt. Visit Completed   Kemper Durie, RN, MSN, [facility] Care Management Care Management Coordinator [phone]

## 2023-06-15 DIAGNOSIS — N186 End stage renal disease: Secondary | ICD-10-CM | POA: Diagnosis not present

## 2023-06-15 DIAGNOSIS — N2581 Secondary hyperparathyroidism of renal origin: Secondary | ICD-10-CM | POA: Diagnosis not present

## 2023-06-15 DIAGNOSIS — Z992 Dependence on renal dialysis: Secondary | ICD-10-CM | POA: Diagnosis not present

## 2023-06-17 DIAGNOSIS — N186 End stage renal disease: Secondary | ICD-10-CM | POA: Diagnosis not present

## 2023-06-17 DIAGNOSIS — Z992 Dependence on renal dialysis: Secondary | ICD-10-CM | POA: Diagnosis not present

## 2023-06-17 DIAGNOSIS — N2581 Secondary hyperparathyroidism of renal origin: Secondary | ICD-10-CM | POA: Diagnosis not present

## 2023-06-19 DIAGNOSIS — Z992 Dependence on renal dialysis: Secondary | ICD-10-CM | POA: Diagnosis not present

## 2023-06-19 DIAGNOSIS — N186 End stage renal disease: Secondary | ICD-10-CM | POA: Diagnosis not present

## 2023-06-20 DIAGNOSIS — N186 End stage renal disease: Secondary | ICD-10-CM | POA: Diagnosis not present

## 2023-06-20 DIAGNOSIS — N2581 Secondary hyperparathyroidism of renal origin: Secondary | ICD-10-CM | POA: Diagnosis not present

## 2023-06-20 DIAGNOSIS — Z992 Dependence on renal dialysis: Secondary | ICD-10-CM | POA: Diagnosis not present

## 2023-06-22 DIAGNOSIS — N2581 Secondary hyperparathyroidism of renal origin: Secondary | ICD-10-CM | POA: Diagnosis not present

## 2023-06-22 DIAGNOSIS — Z992 Dependence on renal dialysis: Secondary | ICD-10-CM | POA: Diagnosis not present

## 2023-06-22 DIAGNOSIS — N186 End stage renal disease: Secondary | ICD-10-CM | POA: Diagnosis not present

## 2023-06-24 DIAGNOSIS — Z992 Dependence on renal dialysis: Secondary | ICD-10-CM | POA: Diagnosis not present

## 2023-06-24 DIAGNOSIS — N186 End stage renal disease: Secondary | ICD-10-CM | POA: Diagnosis not present

## 2023-06-24 DIAGNOSIS — N2581 Secondary hyperparathyroidism of renal origin: Secondary | ICD-10-CM | POA: Diagnosis not present

## 2023-06-27 DIAGNOSIS — N2581 Secondary hyperparathyroidism of renal origin: Secondary | ICD-10-CM | POA: Diagnosis not present

## 2023-06-27 DIAGNOSIS — Z992 Dependence on renal dialysis: Secondary | ICD-10-CM | POA: Diagnosis not present

## 2023-06-27 DIAGNOSIS — N186 End stage renal disease: Secondary | ICD-10-CM | POA: Diagnosis not present

## 2023-06-29 DIAGNOSIS — Z992 Dependence on renal dialysis: Secondary | ICD-10-CM | POA: Diagnosis not present

## 2023-06-29 DIAGNOSIS — N186 End stage renal disease: Secondary | ICD-10-CM | POA: Diagnosis not present

## 2023-06-29 DIAGNOSIS — N2581 Secondary hyperparathyroidism of renal origin: Secondary | ICD-10-CM | POA: Diagnosis not present

## 2023-07-01 DIAGNOSIS — Z992 Dependence on renal dialysis: Secondary | ICD-10-CM | POA: Diagnosis not present

## 2023-07-01 DIAGNOSIS — N2581 Secondary hyperparathyroidism of renal origin: Secondary | ICD-10-CM | POA: Diagnosis not present

## 2023-07-01 DIAGNOSIS — N186 End stage renal disease: Secondary | ICD-10-CM | POA: Diagnosis not present

## 2023-07-04 DIAGNOSIS — Z992 Dependence on renal dialysis: Secondary | ICD-10-CM | POA: Diagnosis not present

## 2023-07-04 DIAGNOSIS — N2581 Secondary hyperparathyroidism of renal origin: Secondary | ICD-10-CM | POA: Diagnosis not present

## 2023-07-04 DIAGNOSIS — N186 End stage renal disease: Secondary | ICD-10-CM | POA: Diagnosis not present

## 2023-07-06 DIAGNOSIS — N186 End stage renal disease: Secondary | ICD-10-CM | POA: Diagnosis not present

## 2023-07-06 DIAGNOSIS — Z992 Dependence on renal dialysis: Secondary | ICD-10-CM | POA: Diagnosis not present

## 2023-07-06 DIAGNOSIS — N2581 Secondary hyperparathyroidism of renal origin: Secondary | ICD-10-CM | POA: Diagnosis not present

## 2023-07-08 DIAGNOSIS — N2581 Secondary hyperparathyroidism of renal origin: Secondary | ICD-10-CM | POA: Diagnosis not present

## 2023-07-08 DIAGNOSIS — N186 End stage renal disease: Secondary | ICD-10-CM | POA: Diagnosis not present

## 2023-07-08 DIAGNOSIS — Z992 Dependence on renal dialysis: Secondary | ICD-10-CM | POA: Diagnosis not present

## 2023-07-11 DIAGNOSIS — N186 End stage renal disease: Secondary | ICD-10-CM | POA: Diagnosis not present

## 2023-07-11 DIAGNOSIS — Z992 Dependence on renal dialysis: Secondary | ICD-10-CM | POA: Diagnosis not present

## 2023-07-11 DIAGNOSIS — N2581 Secondary hyperparathyroidism of renal origin: Secondary | ICD-10-CM | POA: Diagnosis not present

## 2023-07-11 DIAGNOSIS — E119 Type 2 diabetes mellitus without complications: Secondary | ICD-10-CM | POA: Diagnosis not present

## 2023-07-11 DIAGNOSIS — Z794 Long term (current) use of insulin: Secondary | ICD-10-CM | POA: Diagnosis not present

## 2023-07-13 DIAGNOSIS — N2581 Secondary hyperparathyroidism of renal origin: Secondary | ICD-10-CM | POA: Diagnosis not present

## 2023-07-13 DIAGNOSIS — Z992 Dependence on renal dialysis: Secondary | ICD-10-CM | POA: Diagnosis not present

## 2023-07-13 DIAGNOSIS — N186 End stage renal disease: Secondary | ICD-10-CM | POA: Diagnosis not present

## 2023-07-15 DIAGNOSIS — Z992 Dependence on renal dialysis: Secondary | ICD-10-CM | POA: Diagnosis not present

## 2023-07-15 DIAGNOSIS — R32 Unspecified urinary incontinence: Secondary | ICD-10-CM | POA: Diagnosis not present

## 2023-07-15 DIAGNOSIS — N186 End stage renal disease: Secondary | ICD-10-CM | POA: Diagnosis not present

## 2023-07-15 DIAGNOSIS — N2581 Secondary hyperparathyroidism of renal origin: Secondary | ICD-10-CM | POA: Diagnosis not present

## 2023-07-18 DIAGNOSIS — N186 End stage renal disease: Secondary | ICD-10-CM | POA: Diagnosis not present

## 2023-07-18 DIAGNOSIS — Z992 Dependence on renal dialysis: Secondary | ICD-10-CM | POA: Diagnosis not present

## 2023-07-18 DIAGNOSIS — N2581 Secondary hyperparathyroidism of renal origin: Secondary | ICD-10-CM | POA: Diagnosis not present

## 2023-07-20 DIAGNOSIS — N2581 Secondary hyperparathyroidism of renal origin: Secondary | ICD-10-CM | POA: Diagnosis not present

## 2023-07-20 DIAGNOSIS — N186 End stage renal disease: Secondary | ICD-10-CM | POA: Diagnosis not present

## 2023-07-20 DIAGNOSIS — Z992 Dependence on renal dialysis: Secondary | ICD-10-CM | POA: Diagnosis not present

## 2023-07-22 DIAGNOSIS — Z992 Dependence on renal dialysis: Secondary | ICD-10-CM | POA: Diagnosis not present

## 2023-07-22 DIAGNOSIS — N186 End stage renal disease: Secondary | ICD-10-CM | POA: Diagnosis not present

## 2023-07-22 DIAGNOSIS — N2581 Secondary hyperparathyroidism of renal origin: Secondary | ICD-10-CM | POA: Diagnosis not present

## 2023-07-25 DIAGNOSIS — N186 End stage renal disease: Secondary | ICD-10-CM | POA: Diagnosis not present

## 2023-07-25 DIAGNOSIS — N2581 Secondary hyperparathyroidism of renal origin: Secondary | ICD-10-CM | POA: Diagnosis not present

## 2023-07-25 DIAGNOSIS — Z992 Dependence on renal dialysis: Secondary | ICD-10-CM | POA: Diagnosis not present

## 2023-07-27 DIAGNOSIS — Z992 Dependence on renal dialysis: Secondary | ICD-10-CM | POA: Diagnosis not present

## 2023-07-27 DIAGNOSIS — N2581 Secondary hyperparathyroidism of renal origin: Secondary | ICD-10-CM | POA: Diagnosis not present

## 2023-07-27 DIAGNOSIS — N186 End stage renal disease: Secondary | ICD-10-CM | POA: Diagnosis not present

## 2023-07-29 DIAGNOSIS — N2581 Secondary hyperparathyroidism of renal origin: Secondary | ICD-10-CM | POA: Diagnosis not present

## 2023-07-29 DIAGNOSIS — Z992 Dependence on renal dialysis: Secondary | ICD-10-CM | POA: Diagnosis not present

## 2023-07-29 DIAGNOSIS — N186 End stage renal disease: Secondary | ICD-10-CM | POA: Diagnosis not present

## 2023-08-01 DIAGNOSIS — Z992 Dependence on renal dialysis: Secondary | ICD-10-CM | POA: Diagnosis not present

## 2023-08-01 DIAGNOSIS — N2581 Secondary hyperparathyroidism of renal origin: Secondary | ICD-10-CM | POA: Diagnosis not present

## 2023-08-01 DIAGNOSIS — N186 End stage renal disease: Secondary | ICD-10-CM | POA: Diagnosis not present

## 2023-08-03 DIAGNOSIS — Z992 Dependence on renal dialysis: Secondary | ICD-10-CM | POA: Diagnosis not present

## 2023-08-03 DIAGNOSIS — N2581 Secondary hyperparathyroidism of renal origin: Secondary | ICD-10-CM | POA: Diagnosis not present

## 2023-08-03 DIAGNOSIS — N186 End stage renal disease: Secondary | ICD-10-CM | POA: Diagnosis not present

## 2023-08-05 DIAGNOSIS — N186 End stage renal disease: Secondary | ICD-10-CM | POA: Diagnosis not present

## 2023-08-05 DIAGNOSIS — Z992 Dependence on renal dialysis: Secondary | ICD-10-CM | POA: Diagnosis not present

## 2023-08-05 DIAGNOSIS — N2581 Secondary hyperparathyroidism of renal origin: Secondary | ICD-10-CM | POA: Diagnosis not present

## 2023-08-08 DIAGNOSIS — N2581 Secondary hyperparathyroidism of renal origin: Secondary | ICD-10-CM | POA: Diagnosis not present

## 2023-08-08 DIAGNOSIS — N186 End stage renal disease: Secondary | ICD-10-CM | POA: Diagnosis not present

## 2023-08-08 DIAGNOSIS — Z992 Dependence on renal dialysis: Secondary | ICD-10-CM | POA: Diagnosis not present

## 2023-08-10 DIAGNOSIS — N2581 Secondary hyperparathyroidism of renal origin: Secondary | ICD-10-CM | POA: Diagnosis not present

## 2023-08-10 DIAGNOSIS — N186 End stage renal disease: Secondary | ICD-10-CM | POA: Diagnosis not present

## 2023-08-10 DIAGNOSIS — Z992 Dependence on renal dialysis: Secondary | ICD-10-CM | POA: Diagnosis not present

## 2023-08-12 ENCOUNTER — Other Ambulatory Visit (INDEPENDENT_AMBULATORY_CARE_PROVIDER_SITE_OTHER): Payer: Self-pay | Admitting: Nurse Practitioner

## 2023-08-12 DIAGNOSIS — N2581 Secondary hyperparathyroidism of renal origin: Secondary | ICD-10-CM | POA: Diagnosis not present

## 2023-08-12 DIAGNOSIS — N186 End stage renal disease: Secondary | ICD-10-CM | POA: Diagnosis not present

## 2023-08-12 DIAGNOSIS — Z992 Dependence on renal dialysis: Secondary | ICD-10-CM | POA: Diagnosis not present

## 2023-08-15 DIAGNOSIS — N2581 Secondary hyperparathyroidism of renal origin: Secondary | ICD-10-CM | POA: Diagnosis not present

## 2023-08-15 DIAGNOSIS — N186 End stage renal disease: Secondary | ICD-10-CM | POA: Diagnosis not present

## 2023-08-15 DIAGNOSIS — Z992 Dependence on renal dialysis: Secondary | ICD-10-CM | POA: Diagnosis not present

## 2023-08-17 DIAGNOSIS — N2581 Secondary hyperparathyroidism of renal origin: Secondary | ICD-10-CM | POA: Diagnosis not present

## 2023-08-17 DIAGNOSIS — Z992 Dependence on renal dialysis: Secondary | ICD-10-CM | POA: Diagnosis not present

## 2023-08-17 DIAGNOSIS — N186 End stage renal disease: Secondary | ICD-10-CM | POA: Diagnosis not present

## 2023-08-18 ENCOUNTER — Ambulatory Visit (INDEPENDENT_AMBULATORY_CARE_PROVIDER_SITE_OTHER): Payer: Medicare HMO | Admitting: Nurse Practitioner

## 2023-08-18 ENCOUNTER — Encounter (INDEPENDENT_AMBULATORY_CARE_PROVIDER_SITE_OTHER): Payer: Medicare HMO

## 2023-08-19 DIAGNOSIS — N186 End stage renal disease: Secondary | ICD-10-CM | POA: Diagnosis not present

## 2023-08-19 DIAGNOSIS — N2581 Secondary hyperparathyroidism of renal origin: Secondary | ICD-10-CM | POA: Diagnosis not present

## 2023-08-19 DIAGNOSIS — Z992 Dependence on renal dialysis: Secondary | ICD-10-CM | POA: Diagnosis not present

## 2023-08-20 DIAGNOSIS — N186 End stage renal disease: Secondary | ICD-10-CM | POA: Diagnosis not present

## 2023-08-20 DIAGNOSIS — Z992 Dependence on renal dialysis: Secondary | ICD-10-CM | POA: Diagnosis not present

## 2023-08-22 DIAGNOSIS — N2581 Secondary hyperparathyroidism of renal origin: Secondary | ICD-10-CM | POA: Diagnosis not present

## 2023-08-22 DIAGNOSIS — Z992 Dependence on renal dialysis: Secondary | ICD-10-CM | POA: Diagnosis not present

## 2023-08-22 DIAGNOSIS — N186 End stage renal disease: Secondary | ICD-10-CM | POA: Diagnosis not present

## 2023-08-24 DIAGNOSIS — N2581 Secondary hyperparathyroidism of renal origin: Secondary | ICD-10-CM | POA: Diagnosis not present

## 2023-08-24 DIAGNOSIS — N186 End stage renal disease: Secondary | ICD-10-CM | POA: Diagnosis not present

## 2023-08-24 DIAGNOSIS — Z992 Dependence on renal dialysis: Secondary | ICD-10-CM | POA: Diagnosis not present

## 2023-08-26 DIAGNOSIS — N186 End stage renal disease: Secondary | ICD-10-CM | POA: Diagnosis not present

## 2023-08-26 DIAGNOSIS — Z992 Dependence on renal dialysis: Secondary | ICD-10-CM | POA: Diagnosis not present

## 2023-08-26 DIAGNOSIS — N2581 Secondary hyperparathyroidism of renal origin: Secondary | ICD-10-CM | POA: Diagnosis not present

## 2023-08-29 DIAGNOSIS — N2581 Secondary hyperparathyroidism of renal origin: Secondary | ICD-10-CM | POA: Diagnosis not present

## 2023-08-29 DIAGNOSIS — Z992 Dependence on renal dialysis: Secondary | ICD-10-CM | POA: Diagnosis not present

## 2023-08-29 DIAGNOSIS — N186 End stage renal disease: Secondary | ICD-10-CM | POA: Diagnosis not present

## 2023-08-30 ENCOUNTER — Ambulatory Visit: Payer: Self-pay | Admitting: *Deleted

## 2023-08-30 NOTE — Patient Outreach (Signed)
  Care Coordination   Follow Up Visit Note   08/30/2023 Name: Terry Arroyo MRN: [id number] DOB: 1961-09-15  Terry Arroyo is a 62 y.o. year old female who sees Terry Regulus, MD for primary care. I spoke with  Terry Arroyo by phone today.  What matters to the patients health and wellness today?  Obtain new home aide services    Goals Addressed               This Visit's Progress     RNCM:" I need help with my Diabetes" (pt-stated)   On track     Care Coordination Interventions:  Provided education to patient about basic DM disease process.Education provided on a heart healthy/ADA renal diet. Reviewed medications with patient and discussed importance of medication adherence. Counseled on importance of regular laboratory monitoring as prescribed.  Discussed plans with patient for ongoing care management follow up and provided patient with direct contact information for care management team. Provided the patient with the RNCM number. Provided patient with written educational materials related to hypo and hyperglycemia and importance of correct treatment. Education and support given.  Advised patient, providing education and rationale, to check cbg TID and record, calling pcp or endocrinologist  for findings outside established parameters Review of patient status, including review of consultants reports, relevant laboratory and other test results, and medications completed           SDOH assessments and interventions completed:  No     Care Coordination Interventions:  Yes, provided   Interventions Today    Flowsheet Row Most Recent Value  Chronic Disease   Chronic disease during today's visit Diabetes, Chronic Obstructive Pulmonary Disease (COPD), Congestive Heart Failure (CHF), Hypertension (HTN), Chronic Kidney Disease/End Stage Renal Disease (ESRD)  General Interventions   General Interventions Discussed/Reviewed General Interventions Reviewed, Labs, Doctor  Visits, [employer] she is not happy with current aide agency, looking to find a new one.]  Labs Hgb A1c every 3 months  [Due for A1C, will have done next month]  Doctor Visits Discussed/Reviewed Doctor Visits Reviewed, PCP, Specialist  [canceled appts in August due to schedule conflict, rescheduled PCP and vascular for 10/24]  PCP/Specialist Visits Compliance with follow-up visit  Exercise Interventions   Exercise Discussed/Reviewed Weight Managment  Weight Management Weight maintenance  Education Interventions   Education Provided Provided Education  Provided Verbal Education On Nutrition, Blood Sugar Monitoring, Medication, When to see the doctor, Community Resources  [Agrees to have [employer] call to help with securing new in home aide services]  Nutrition Interventions   Nutrition Discussed/Reviewed Nutrition Reviewed, Carbohydrate meal planning, Adding fruits and vegetables, Decreasing sugar intake, Portion sizes, Decreasing salt, Fluid intake       Follow up plan: Follow up call scheduled for 10/15    Encounter Outcome:  Patient Visit Completed   Kemper Durie, RN, MSN, [facility] Care Management Care Management Coordinator [phone]

## 2023-08-31 DIAGNOSIS — Z992 Dependence on renal dialysis: Secondary | ICD-10-CM | POA: Diagnosis not present

## 2023-08-31 DIAGNOSIS — N2581 Secondary hyperparathyroidism of renal origin: Secondary | ICD-10-CM | POA: Diagnosis not present

## 2023-08-31 DIAGNOSIS — N186 End stage renal disease: Secondary | ICD-10-CM | POA: Diagnosis not present

## 2023-09-02 ENCOUNTER — Other Ambulatory Visit: Payer: Self-pay | Admitting: Internal Medicine

## 2023-09-02 DIAGNOSIS — N2581 Secondary hyperparathyroidism of renal origin: Secondary | ICD-10-CM | POA: Diagnosis not present

## 2023-09-02 DIAGNOSIS — N186 End stage renal disease: Secondary | ICD-10-CM | POA: Diagnosis not present

## 2023-09-02 DIAGNOSIS — Z1231 Encounter for screening mammogram for malignant neoplasm of breast: Secondary | ICD-10-CM

## 2023-09-02 DIAGNOSIS — Z992 Dependence on renal dialysis: Secondary | ICD-10-CM | POA: Diagnosis not present

## 2023-09-05 DIAGNOSIS — N186 End stage renal disease: Secondary | ICD-10-CM | POA: Diagnosis not present

## 2023-09-05 DIAGNOSIS — N2581 Secondary hyperparathyroidism of renal origin: Secondary | ICD-10-CM | POA: Diagnosis not present

## 2023-09-05 DIAGNOSIS — Z992 Dependence on renal dialysis: Secondary | ICD-10-CM | POA: Diagnosis not present

## 2023-09-07 DIAGNOSIS — Z992 Dependence on renal dialysis: Secondary | ICD-10-CM | POA: Diagnosis not present

## 2023-09-07 DIAGNOSIS — N186 End stage renal disease: Secondary | ICD-10-CM | POA: Diagnosis not present

## 2023-09-07 DIAGNOSIS — N2581 Secondary hyperparathyroidism of renal origin: Secondary | ICD-10-CM | POA: Diagnosis not present

## 2023-09-09 DIAGNOSIS — N2581 Secondary hyperparathyroidism of renal origin: Secondary | ICD-10-CM | POA: Diagnosis not present

## 2023-09-09 DIAGNOSIS — Z992 Dependence on renal dialysis: Secondary | ICD-10-CM | POA: Diagnosis not present

## 2023-09-09 DIAGNOSIS — N186 End stage renal disease: Secondary | ICD-10-CM | POA: Diagnosis not present

## 2023-09-12 DIAGNOSIS — N2581 Secondary hyperparathyroidism of renal origin: Secondary | ICD-10-CM | POA: Diagnosis not present

## 2023-09-12 DIAGNOSIS — N186 End stage renal disease: Secondary | ICD-10-CM | POA: Diagnosis not present

## 2023-09-12 DIAGNOSIS — Z992 Dependence on renal dialysis: Secondary | ICD-10-CM | POA: Diagnosis not present

## 2023-09-14 DIAGNOSIS — N186 End stage renal disease: Secondary | ICD-10-CM | POA: Diagnosis not present

## 2023-09-14 DIAGNOSIS — N2581 Secondary hyperparathyroidism of renal origin: Secondary | ICD-10-CM | POA: Diagnosis not present

## 2023-09-14 DIAGNOSIS — Z992 Dependence on renal dialysis: Secondary | ICD-10-CM | POA: Diagnosis not present

## 2023-09-16 DIAGNOSIS — N186 End stage renal disease: Secondary | ICD-10-CM | POA: Diagnosis not present

## 2023-09-16 DIAGNOSIS — Z992 Dependence on renal dialysis: Secondary | ICD-10-CM | POA: Diagnosis not present

## 2023-09-16 DIAGNOSIS — N2581 Secondary hyperparathyroidism of renal origin: Secondary | ICD-10-CM | POA: Diagnosis not present

## 2023-09-19 DIAGNOSIS — N2581 Secondary hyperparathyroidism of renal origin: Secondary | ICD-10-CM | POA: Diagnosis not present

## 2023-09-19 DIAGNOSIS — Z992 Dependence on renal dialysis: Secondary | ICD-10-CM | POA: Diagnosis not present

## 2023-09-19 DIAGNOSIS — R32 Unspecified urinary incontinence: Secondary | ICD-10-CM | POA: Diagnosis not present

## 2023-09-19 DIAGNOSIS — U071 COVID-19: Secondary | ICD-10-CM | POA: Diagnosis not present

## 2023-09-19 DIAGNOSIS — N186 End stage renal disease: Secondary | ICD-10-CM | POA: Diagnosis not present

## 2023-10-04 ENCOUNTER — Ambulatory Visit: Payer: Self-pay | Admitting: *Deleted

## 2023-10-04 NOTE — Patient Outreach (Signed)
Care Coordination   Follow Up Visit Note   10/04/2023 Name: Terry Arroyo MRN: [id number] DOB: 1960/12/26  Terry Arroyo is a 62 y.o. year old female who sees [name], MD for primary care. I spoke with  [name] by phone today.  What matters to the patients health and wellness today?  Still looking to have in home aide services restarted.      Goals Addressed               This Visit's Progress     COMPLETED: RNCM:" I need help with my Diabetes" (pt-stated)        Care Coordination Interventions:  Provided education to patient about basic DM disease process.Education provided on a heart healthy/ADA renal diet. Reviewed medications with patient and discussed importance of medication adherence. Counseled on importance of regular laboratory monitoring as prescribed.  Discussed plans with patient for ongoing care management follow up and provided patient with direct contact information for care management team. Provided the patient with the RNCM number. Provided patient with written educational materials related to hypo and hyperglycemia and importance of correct treatment. Education and support given.  Advised patient, providing education and rationale, to check cbg TID and record, calling pcp or endocrinologist  for findings outside established parameters Review of patient status, including review of consultants reports, relevant laboratory and other test results, and medications completed           SDOH assessments and interventions completed:  No     Care Coordination Interventions:  Yes, provided   Interventions Today    Flowsheet Row Most Recent Value  Chronic Disease   Chronic disease during today's visit Diabetes, Congestive Heart Failure (CHF), Chronic Obstructive Pulmonary Disease (COPD)  General Interventions   General Interventions Discussed/Reviewed General Interventions Reviewed, Doctor Visits, [employer], Communication with   [Still does not have new home aide services restarted]  Doctor Visits Discussed/Reviewed Doctor Visits Reviewed, PCP, Specialist  [Vascular and PCP visits on 10/24, she does not feel she will be able to make both, will reschedule vascular]  PCP/Specialist Visits Compliance with follow-up visit  Communication with Social Work  Insurance claims handler with SW to follow up on restarting in home aide services]  Education Interventions   Education Provided Provided Education  Provided Verbal Education On [employer], Medication, When to see the doctor  [place] insurance from [place] to [employer] care.  Was getting medications mail order, advised to let [facility] know she would like to continue mail order]       Follow up plan: Follow up call scheduled for 11/13    Encounter Outcome:  Patient Visit Completed

## 2023-10-13 ENCOUNTER — Encounter (INDEPENDENT_AMBULATORY_CARE_PROVIDER_SITE_OTHER): Payer: Medicare HMO

## 2023-10-13 ENCOUNTER — Encounter: Payer: Self-pay | Admitting: *Deleted

## 2023-10-13 ENCOUNTER — Telehealth: Payer: Self-pay | Admitting: *Deleted

## 2023-10-13 ENCOUNTER — Ambulatory Visit (INDEPENDENT_AMBULATORY_CARE_PROVIDER_SITE_OTHER): Payer: Medicare HMO | Admitting: Nurse Practitioner

## 2023-10-13 NOTE — Patient Outreach (Signed)
Care Coordination   10/13/2023 Name: Terry Arroyo MRN: [id number] DOB: 1961-11-12   Care Coordination Outreach Attempts:  An unsuccessful telephone outreach was attempted for a scheduled appointment today.  Follow Up Plan:  Additional outreach attempts will be made to offer the patient care coordination information and services.   Encounter Outcome:  No Answer   Care Coordination Interventions:  No, not indicated    Mossie Gilder, LCSW [place]  [facility], [facility] Licensed Clinical Social Worker Care Coordinator  Direct Dial: [phone]

## 2023-10-20 ENCOUNTER — Telehealth: Payer: Self-pay | Admitting: *Deleted

## 2023-11-01 ENCOUNTER — Ambulatory Visit
Admission: RE | Admit: 2023-11-01 | Discharge: 2023-11-01 | Disposition: A | Discharge status: Home / Self Care | Payer: 59 | Source: Ambulatory Visit | Attending: Internal Medicine | Admitting: Internal Medicine

## 2023-11-01 DIAGNOSIS — Z1231 Encounter for screening mammogram for malignant neoplasm of breast: Secondary | ICD-10-CM | POA: Diagnosis present

## 2023-11-02 ENCOUNTER — Ambulatory Visit: Payer: Self-pay | Admitting: *Deleted

## 2023-11-02 NOTE — Patient Outreach (Signed)
  Care Coordination   Follow Up Visit Note   11/02/2023 Name: NORENE MINCER MRN: [id number] DOB: [birth year]  DAMESHIA HORIE is a 62 y.o. year old female who sees [name], MD for primary care. I spoke with  [name] by phone today.  What matters to the patients health and wellness today?  Admits she still has not called CSW regarding home aide assistance, but will call.  Does not have much time to talk today, currently at dialysis. Denies any urgent concerns, encouraged to contact this care manager with questions.      Goals Addressed             This Visit's Progress    Effective management of chronic medical conditions   On track     Interventions Today    Flowsheet Row Most Recent Value  Chronic Disease   Chronic disease during today's visit Diabetes, Chronic Obstructive Pulmonary Disease (COPD), Hypertension (HTN), Chronic Kidney Disease/End Stage Renal Disease (ESRD), Congestive Heart Failure (CHF)  General Interventions   General Interventions Discussed/Reviewed General Interventions Reviewed, [employer]  [Reminded again to call SW regarding in home aide assistance.  Provided with contact information]               SDOH assessments and interventions completed:  No     Care Coordination Interventions:  Yes, provided   Follow up plan: Follow up call scheduled for 12/10    Encounter Outcome:  Patient Visit Completed   Kemper Durie RN, MSN, CCM [place]  [facility], [facility] RN Care Coordinator Direct Dial: [phone] / Main [phone] Fax [phone] Email: [email or web address] Website: [email or web address]

## 2023-11-15 ENCOUNTER — Encounter (INDEPENDENT_AMBULATORY_CARE_PROVIDER_SITE_OTHER): Payer: Medicare HMO

## 2023-11-15 ENCOUNTER — Ambulatory Visit (INDEPENDENT_AMBULATORY_CARE_PROVIDER_SITE_OTHER): Payer: Medicare HMO | Admitting: Nurse Practitioner

## 2023-11-29 ENCOUNTER — Ambulatory Visit: Payer: Self-pay | Admitting: *Deleted

## 2023-11-29 NOTE — Patient Outreach (Signed)
  Care Coordination   11/29/2023 Name: Terry Arroyo MRN: [id number] DOB: May 23, 1961   Care Coordination Outreach Attempts:  An unsuccessful telephone outreach was attempted for a scheduled appointment today.  Call attempted twice, voice message left.   Follow Up Plan:  Additional outreach attempts will be made to offer the patient care coordination information and services.   Encounter Outcome:  No Answer   Care Coordination Interventions:  No, not indicated    Rodney Langton, RN, MSN, CCM [place]  [facility], [facility] RN Care Coordinator Direct Dial: [phone] / Main [phone] Fax [phone] Email: [email or web address]: [email or web address]

## 2023-12-07 ENCOUNTER — Other Ambulatory Visit: Payer: Self-pay

## 2023-12-07 ENCOUNTER — Observation Stay
Admission: EM | Admit: 2023-12-07 | Discharge: 2023-12-08 | Disposition: A | Discharge status: Home / Self Care | Payer: 59 | Attending: Internal Medicine | Admitting: Internal Medicine

## 2023-12-07 ENCOUNTER — Emergency Department: Payer: 59

## 2023-12-07 ENCOUNTER — Observation Stay: Payer: 59

## 2023-12-07 DIAGNOSIS — Z8616 Personal history of COVID-19: Secondary | ICD-10-CM | POA: Insufficient documentation

## 2023-12-07 DIAGNOSIS — Z992 Dependence on renal dialysis: Secondary | ICD-10-CM | POA: Insufficient documentation

## 2023-12-07 DIAGNOSIS — R0602 Shortness of breath: Secondary | ICD-10-CM | POA: Diagnosis present

## 2023-12-07 DIAGNOSIS — J439 Emphysema, unspecified: Secondary | ICD-10-CM | POA: Diagnosis present

## 2023-12-07 DIAGNOSIS — Z87891 Personal history of nicotine dependence: Secondary | ICD-10-CM | POA: Insufficient documentation

## 2023-12-07 DIAGNOSIS — E785 Hyperlipidemia, unspecified: Secondary | ICD-10-CM | POA: Diagnosis not present

## 2023-12-07 DIAGNOSIS — J810 Acute pulmonary edema: Secondary | ICD-10-CM | POA: Insufficient documentation

## 2023-12-07 DIAGNOSIS — R262 Difficulty in walking, not elsewhere classified: Secondary | ICD-10-CM | POA: Diagnosis present

## 2023-12-07 DIAGNOSIS — Z794 Long term (current) use of insulin: Secondary | ICD-10-CM | POA: Diagnosis not present

## 2023-12-07 DIAGNOSIS — I5023 Acute on chronic systolic (congestive) heart failure: Secondary | ICD-10-CM | POA: Diagnosis present

## 2023-12-07 DIAGNOSIS — Z951 Presence of aortocoronary bypass graft: Secondary | ICD-10-CM | POA: Insufficient documentation

## 2023-12-07 DIAGNOSIS — Z7982 Long term (current) use of aspirin: Secondary | ICD-10-CM | POA: Insufficient documentation

## 2023-12-07 DIAGNOSIS — R2681 Unsteadiness on feet: Principal | ICD-10-CM | POA: Diagnosis present

## 2023-12-07 DIAGNOSIS — Z8673 Personal history of transient ischemic attack (TIA), and cerebral infarction without residual deficits: Secondary | ICD-10-CM | POA: Insufficient documentation

## 2023-12-07 DIAGNOSIS — R001 Bradycardia, unspecified: Secondary | ICD-10-CM

## 2023-12-07 DIAGNOSIS — N186 End stage renal disease: Secondary | ICD-10-CM

## 2023-12-07 DIAGNOSIS — Z79899 Other long term (current) drug therapy: Secondary | ICD-10-CM | POA: Insufficient documentation

## 2023-12-07 DIAGNOSIS — E1169 Type 2 diabetes mellitus with other specified complication: Secondary | ICD-10-CM | POA: Diagnosis not present

## 2023-12-07 DIAGNOSIS — I132 Hypertensive heart and chronic kidney disease with heart failure and with stage 5 chronic kidney disease, or end stage renal disease: Secondary | ICD-10-CM | POA: Diagnosis not present

## 2023-12-07 DIAGNOSIS — I2510 Atherosclerotic heart disease of native coronary artery without angina pectoris: Secondary | ICD-10-CM | POA: Diagnosis not present

## 2023-12-07 DIAGNOSIS — Z86718 Personal history of other venous thrombosis and embolism: Secondary | ICD-10-CM | POA: Diagnosis not present

## 2023-12-07 DIAGNOSIS — I10 Essential (primary) hypertension: Secondary | ICD-10-CM | POA: Diagnosis not present

## 2023-12-07 LAB — BASIC METABOLIC PANEL
Anion gap: 12 (ref 5–15)
BUN: 44 mg/dL — ABNORMAL HIGH (ref 8–23)
CO2: 25 mmol/L (ref 22–32)
Calcium: 9 mg/dL (ref 8.9–10.3)
Chloride: 100 mmol/L (ref 98–111)
Creatinine, Ser: 7.82 mg/dL — ABNORMAL HIGH (ref 0.44–1.00)
GFR, Estimated: 5 mL/min — ABNORMAL LOW (ref >60)
Glucose, Bld: 149 mg/dL — ABNORMAL HIGH (ref 70–99)
Potassium: 5 mmol/L (ref 3.5–5.1)
Sodium: 137 mmol/L (ref 135–145)

## 2023-12-07 LAB — TSH: TSH: 2.449 u[IU]/mL (ref 0.350–4.500)

## 2023-12-07 LAB — CBC
HCT: 33.6 % — ABNORMAL LOW (ref 36.0–46.0)
Hemoglobin: 10.5 g/dL — ABNORMAL LOW (ref 12.0–15.0)
MCH: 30.2 pg (ref 26.0–34.0)
MCHC: 31.3 g/dL (ref 30.0–36.0)
MCV: 96.6 fL (ref 80.0–100.0)
Platelets: 153 10*3/uL (ref 150–400)
RBC: 3.48 MIL/uL — ABNORMAL LOW (ref 3.87–5.11)
RDW: 14.8 % (ref 11.5–15.5)
WBC: 7.9 10*3/uL (ref 4.0–10.5)
nRBC: 0 % (ref 0.0–0.2)

## 2023-12-07 LAB — BRAIN NATRIURETIC PEPTIDE: B Natriuretic Peptide: 1576 pg/mL — ABNORMAL HIGH (ref 0.0–100.0)

## 2023-12-07 LAB — CBG MONITORING, ED: Glucose-Capillary: 233 mg/dL — ABNORMAL HIGH (ref 70–99)

## 2023-12-07 LAB — HEPATITIS B SURFACE ANTIGEN: Hepatitis B Surface Ag: NONREACTIVE

## 2023-12-07 LAB — TROPONIN I (HIGH SENSITIVITY): Troponin I (High Sensitivity): 26 ng/L — ABNORMAL HIGH (ref <18)

## 2023-12-07 MED ORDER — VITAMIN B-12 1000 MCG PO TABS
1000.0000 ug | ORAL_TABLET | Freq: Every day | ORAL | Status: DC
  Administered 2023-12-08: 1000 ug via ORAL
  Filled 2023-12-07: qty 1

## 2023-12-07 MED ORDER — CALCIUM ACETATE (PHOS BINDER) 667 MG PO CAPS
1334.0000 mg | ORAL_CAPSULE | Freq: Three times a day (TID) | ORAL | Status: DC
  Administered 2023-12-08 (×2): 1334 mg via ORAL
  Filled 2023-12-07 (×3): qty 2

## 2023-12-07 MED ORDER — ACETAMINOPHEN 650 MG RE SUPP: 650.0000 mg | Freq: Four times a day (QID) | RECTAL | Status: DC | PRN

## 2023-12-07 MED ORDER — ONDANSETRON HCL 4 MG/2ML IJ SOLN: 4.0000 mg | Freq: Four times a day (QID) | INTRAMUSCULAR | Status: DC | PRN

## 2023-12-07 MED ORDER — MIRTAZAPINE 30 MG PO TABS
30.0000 mg | ORAL_TABLET | Freq: Every day | ORAL | Status: DC
  Administered 2023-12-07: 30 mg via ORAL
  Filled 2023-12-07: qty 2
  Filled 2023-12-07: qty 1

## 2023-12-07 MED ORDER — IPRATROPIUM-ALBUTEROL 0.5-2.5 (3) MG/3ML IN SOLN: 3.0000 mL | Freq: Four times a day (QID) | RESPIRATORY_TRACT | Status: DC | PRN

## 2023-12-07 MED ORDER — AMLODIPINE BESYLATE 5 MG PO TABS
10.0000 mg | ORAL_TABLET | Freq: Every day | ORAL | Status: DC
Start: 2023-12-08 — End: 2023-12-08
  Administered 2023-12-08: 10 mg via ORAL
  Filled 2023-12-07: qty 2

## 2023-12-07 MED ORDER — TOPIRAMATE 25 MG PO TABS
50.0000 mg | ORAL_TABLET | Freq: Every day | ORAL | Status: DC
  Administered 2023-12-08: 50 mg via ORAL
  Filled 2023-12-07: qty 2

## 2023-12-07 MED ORDER — GABAPENTIN 300 MG PO CAPS
300.0000 mg | ORAL_CAPSULE | Freq: Every day | ORAL | Status: DC
  Administered 2023-12-07: 300 mg via ORAL
  Filled 2023-12-07: qty 1

## 2023-12-07 MED ORDER — PENTAFLUOROPROP-TETRAFLUOROETH EX AERO
1.0000 | INHALATION_SPRAY | CUTANEOUS | Status: DC | PRN
  Filled 2023-12-07: qty 30

## 2023-12-07 MED ORDER — CARVEDILOL 12.5 MG PO TABS: 12.5000 mg | ORAL_TABLET | Freq: Two times a day (BID) | ORAL | Status: DC

## 2023-12-07 MED ORDER — FUROSEMIDE 10 MG/ML IJ SOLN
80.0000 mg | Freq: Once | INTRAMUSCULAR | Status: AC
  Administered 2023-12-07: 80 mg via INTRAVENOUS
  Filled 2023-12-07 (×2): qty 8

## 2023-12-07 MED ORDER — HEPARIN SODIUM (PORCINE) 5000 UNIT/ML IJ SOLN
5000.0000 [IU] | Freq: Three times a day (TID) | INTRAMUSCULAR | Status: DC
  Administered 2023-12-07 – 2023-12-08 (×2): 5000 [IU] via SUBCUTANEOUS
  Filled 2023-12-07 (×2): qty 1

## 2023-12-07 MED ORDER — INSULIN ASPART 100 UNIT/ML IJ SOLN
0.0000 [IU] | Freq: Three times a day (TID) | INTRAMUSCULAR | Status: DC
Start: 2023-12-07 — End: 2023-12-08
  Administered 2023-12-07: 3 [IU] via SUBCUTANEOUS
  Administered 2023-12-08 (×2): 2 [IU] via SUBCUTANEOUS
  Filled 2023-12-07 (×3): qty 1

## 2023-12-07 MED ORDER — ONDANSETRON HCL 4 MG PO TABS: 4.0000 mg | ORAL_TABLET | Freq: Four times a day (QID) | ORAL | Status: DC | PRN

## 2023-12-07 MED ORDER — ASPIRIN 81 MG PO TBEC
81.0000 mg | DELAYED_RELEASE_TABLET | Freq: Every day | ORAL | Status: DC
  Administered 2023-12-07 – 2023-12-08 (×2): 81 mg via ORAL
  Filled 2023-12-07 (×2): qty 1

## 2023-12-07 MED ORDER — CARVEDILOL 6.25 MG PO TABS
12.5000 mg | ORAL_TABLET | Freq: Two times a day (BID) | ORAL | Status: DC
  Administered 2023-12-08: 12.5 mg via ORAL
  Filled 2023-12-07: qty 2

## 2023-12-07 MED ORDER — ATORVASTATIN CALCIUM 80 MG PO TABS
80.0000 mg | ORAL_TABLET | Freq: Every day | ORAL | Status: DC
  Administered 2023-12-07: 80 mg via ORAL
  Filled 2023-12-07: qty 4
  Filled 2023-12-07: qty 1

## 2023-12-07 MED ORDER — INSULIN GLARGINE-YFGN 100 UNIT/ML ~~LOC~~ SOLN
5.0000 [IU] | Freq: Every day | SUBCUTANEOUS | Status: DC
  Administered 2023-12-07: 5 [IU] via SUBCUTANEOUS
  Filled 2023-12-07 (×2): qty 0.05

## 2023-12-07 MED ORDER — POLYETHYLENE GLYCOL 3350 17 G PO PACK: 17.0000 g | PACK | Freq: Every day | ORAL | Status: DC | PRN

## 2023-12-07 MED ORDER — TRAZODONE HCL 100 MG PO TABS
100.0000 mg | ORAL_TABLET | Freq: Every day | ORAL | Status: DC
  Administered 2023-12-07: 100 mg via ORAL
  Filled 2023-12-07: qty 1

## 2023-12-07 MED ORDER — LIDOCAINE-PRILOCAINE 2.5-2.5 % EX CREA: 1.0000 | TOPICAL_CREAM | CUTANEOUS | Status: DC | PRN

## 2023-12-07 MED ORDER — CHLORHEXIDINE GLUCONATE CLOTH 2 % EX PADS
6.0000 | MEDICATED_PAD | Freq: Every day | CUTANEOUS | Status: DC
  Filled 2023-12-07: qty 6

## 2023-12-07 MED ORDER — HEPARIN SODIUM (PORCINE) 1000 UNIT/ML DIALYSIS
1000.0000 [IU] | INTRAMUSCULAR | Status: DC | PRN
  Filled 2023-12-07: qty 1

## 2023-12-07 MED ORDER — ACETAMINOPHEN 325 MG PO TABS: 650.0000 mg | ORAL_TABLET | Freq: Four times a day (QID) | ORAL | Status: DC | PRN

## 2023-12-07 MED ORDER — SODIUM CHLORIDE 0.9% FLUSH
3.0000 mL | Freq: Two times a day (BID) | INTRAVENOUS | Status: DC
  Administered 2023-12-07 – 2023-12-08 (×3): 3 mL via INTRAVENOUS

## 2023-12-07 MED ORDER — FAMOTIDINE 20 MG PO TABS: 10.0000 mg | ORAL_TABLET | Freq: Every day | ORAL | Status: DC | PRN

## 2023-12-07 NOTE — Progress Notes (Deleted)
Advanced Heart Failure Clinic Note    PCP: [name], MD (last seen 10/24) Cardiologist: Dorothyann Peng, MD (last seen 03/23)  HPI:   Terry Arroyo is a 62 y/o female with a history of DM, hyperlipidemia, HTN, brain tumor, left eye blindness, previous tobacco use and chronic heart failure.   Was in the ED 10/30/22 due to ankle pain.    Was in the ED 12/07/23 due to    Echo 02/27/20: EF 30-35% along with mild MR. Echo 06/13/21: EF 35-40% along with mild MR and mild/moderate TR.      She presents today for a HF follow-up visit with a chief complaint of   Getting dialysis on M, W, F. Takes her lasix on her non-dialysis days. Gets weighed at dialysis.   Past Medical History:  Diagnosis Date   Anemia    Angina at rest [facility]    Angioedema 03/20/2021   Lisinopril   Anterior cerebral aneurysm    approximately 5 mm; left MCA    Anxiety    Aortic atherosclerosis (HCC)    Asthma    Blind left eye    CHF (congestive heart failure) (HCC)    COPD (chronic obstructive pulmonary disease) (HCC)    Coronary artery disease    Depression    DOE (dyspnea on exertion)    ESRD (end stage renal disease) (HCC)    Gait instability    Genital herpes    GERD (gastroesophageal reflux disease)    HFrEF (heart failure with reduced ejection fraction) (HCC)    History of 2019 novel coronavirus disease (COVID-19) 03/2019   [facility] [place]   History of DVT (deep vein thrombosis)    History of DVT (deep vein thrombosis)    Hx of CABG 2018   x 4; performed in [place]   Hyperlipidemia    Hypertension    MI (myocardial infarction) (HCC) 2018   no stents   Pituitary adenoma (HCC)    PVD (posterior vitreous detachment), right eye    Secondary hyperparathyroidism (HCC)    Sleep apnea    non-compliant with nocturnal PAP therapy   Stroke [facility]) 2014   stated affected left side.   T2DM (type 2 diabetes mellitus) (HCC)     No current facility-administered medications  for this visit.   Current Outpatient Medications  Medication Sig Dispense Refill   acetaminophen (TYLENOL) 500 MG tablet Take 1,000 mg by mouth every 6 (six) hours as needed for mild pain or moderate pain.     albuterol (VENTOLIN HFA) 108 (90 Base) MCG/ACT inhaler Inhale 2 puffs into the lungs every 6 (six) hours as needed for wheezing or shortness of breath.     amLODipine (NORVASC) 10 MG tablet Take 1 tablet (10 mg total) by mouth daily. 30 tablet 0   aspirin 81 MG EC tablet Take 1 tablet (81 mg total) by mouth daily. 30 tablet 0   atorvastatin (LIPITOR) 80 MG tablet Take 1 tablet (80 mg total) by mouth daily at 6 PM. 30 tablet 0   B Complex-C-Folic Acid (RENA-VITE RX) 1 MG TABS Take 1 tablet by mouth daily.     Blood Glucose Monitoring Suppl (TRUE METRIX METER) w/Device KIT      calcium acetate (PHOSLO) 667 MG capsule Take by mouth.     carvedilol (COREG) 25 MG tablet Take 25 mg by mouth 2 (two) times daily.     famotidine (PEPCID) 40 MG tablet Take 1 tablet by mouth as needed  for heartburn.     furosemide (LASIX) 80 MG tablet Take 80 mg by mouth 4 (four) times a week. Tues, Thurs, Sat, and Sun (non-dialysis days)     gabapentin (NEURONTIN) 100 MG capsule Take 100 mg by mouth daily as needed (pain).     insulin aspart protamine- aspart (NOVOLOG MIX 70/30) (70-30) 100 UNIT/ML injection Inject 0.15 mLs (15 Units total) into the skin 2 (two) times daily with a meal. (Patient taking differently: Inject 15 Units into the skin 3 (three) times daily with meals.) 10 mL 0   lidocaine-prilocaine (EMLA) cream Apply 1 Application topically See admin instructions. Used on Mon Wed and Fri     loperamide (IMODIUM) 2 MG capsule Take 2 mg by mouth daily.     mirtazapine (REMERON) 30 MG tablet Take 30 mg by mouth at bedtime.     polyethylene glycol-electrolytes (NULYTELY) 420 g solution Take 4,000 mLs by mouth daily as needed.     riboflavin (VITAMIN B-2) 100 MG TABS tablet Take 2 tablets by mouth daily.      topiramate (TOPAMAX) 25 MG tablet Take 25 mg by mouth daily.     traZODone (DESYREL) 50 MG tablet Take 0.5 tablets (25 mg total) by mouth at bedtime. (Patient taking differently: Take 100 mg by mouth at bedtime.)     TRUE METRIX BLOOD GLUCOSE TEST test strip      vitamin B-12 (CYANOCOBALAMIN) 1000 MCG tablet Take 1,000 mcg by mouth daily.     Facility-Administered Medications Ordered in Other Visits  Medication Dose Route Frequency Provider Last Rate Last Admin   acetaminophen (TYLENOL) tablet 650 mg  650 mg Oral Q6H PRN Verdene Lennert, MD       Or   acetaminophen (TYLENOL) suppository 650 mg  650 mg Rectal Q6H PRN Verdene Lennert, MD       heparin injection 5,000 Units  5,000 Units Subcutaneous Q8H Verdene Lennert, MD       ipratropium-albuterol (DUONEB) 0.5-2.5 (3) MG/3ML nebulizer solution 3 mL  3 mL Nebulization Q6H PRN Verdene Lennert, MD       ondansetron (ZOFRAN) tablet 4 mg  4 mg Oral Q6H PRN Verdene Lennert, MD       Or   ondansetron (ZOFRAN) injection 4 mg  4 mg Intravenous Q6H PRN Verdene Lennert, MD       polyethylene glycol (MIRALAX / GLYCOLAX) packet 17 g  17 g Oral Daily PRN Verdene Lennert, MD       sodium chloride flush (NS) 0.9 % injection 3 mL  3 mL Intravenous Q12H Verdene Lennert, MD        Allergies  Allergen Reactions   Hydralazine Hives, Shortness Of Breath, Swelling and Rash    Body aches    Kiwi Extract Hives, Swelling and Rash    Mouth swells    Lisinopril Swelling    Angioedema   Shellfish Allergy Anaphylaxis    Shrimp/lobster  Betadine leaves welts on skin     Betadine [Povidone Iodine] Hives and Rash    LEAVES WELTS ON SKIN   Septra [Sulfamethoxazole-Trimethoprim]    Metformin Diarrhea and Other (See Comments)    GI Intolerance   Tape Itching    Use paper tape whenever possible      Social History   Socioeconomic History   Marital status: Single    Spouse name: Not on file   Number of children: Not on file   Years of education: Not on  file   Highest education level: Not  on file  Occupational History    Comment: disabled  Tobacco Use   Smoking status: Former    Current packs/day: 0.00    Types: Cigarettes    Quit date: 2018    Years since quitting: 6.9   Smokeless tobacco: Never  Vaping Use   Vaping status: Never Used  Substance and Sexual Activity   Alcohol use: Not Currently   Drug use: Not Currently   Sexual activity: Not Currently  Other Topics Concern   Not on file  Social History Narrative   Patient lives alone.  Feels safe in her home.   Social Drivers of Corporate investment banker Strain: Low Risk  (10/13/2023)   Received from [facility]   Overall Financial Resource Strain (CARDIA)    Difficulty of Paying Living Expenses: Not very hard  Food Insecurity: No Food Insecurity (10/13/2023)   Received from [facility]   Hunger Vital Sign    Worried About Running Out of Food in the Last Year: Never true    Ran Out of Food in the Last Year: Never true  Transportation Needs: No Transportation Needs (10/13/2023)   Received from [facility]    In the past 12 months, has lack of transportation kept you from medical appointments or from getting medications?: No    Lack of Transportation (Non-Medical): No  Physical Activity: Not on file  Stress: Not on file  Social Connections: Not on file  Intimate Partner Violence: Not At Risk (10/05/2022)   Humiliation, Afraid, Rape, and Kick questionnaire    Fear of Current or Ex-Partner: No    Emotionally Abused: No    Physically Abused: No    Sexually Abused: No      Family History  Problem Relation Age of Onset   CAD Mother    CAD Brother    Breast cancer Sister 53      PHYSICAL EXAM: General:  Well appearing. No respiratory difficulty HEENT: normal Neck: supple. no JVD. No lymphadenopathy or thyromegaly appreciated. Cor: PMI nondisplaced. Regular rate & rhythm. No  rubs, gallops or murmurs. Lungs: clear Abdomen: soft, nontender, nondistended. No hepatosplenomegaly. No bruits or masses.  Extremities: no cyanosis, clubbing, rash, edema Neuro: alert & oriented x 3, cranial nerves grossly intact. moves all 4 extremities w/o difficulty. Affect pleasant.  ECG:   ASSESSMENT & PLAN:  1: Chronic heart failure with reduced ejection fraction- - suspect due to - NYHA class III - euvolemic today - weighing at dialysis as she says that her weight fluctuates in between sessions - weight 163.4 pounds from last visit here 9 months ago - Echo 02/27/20: EF 30-35% along with mild MR. - Echo 06/13/21: EF 35-40% along with mild MR and mild/moderate TR.   - not adding salt  - saw cardiology Juliann Pares) 03/23 - continue carvedilol 25mg  BID - continue furosemide 80mg  on non-dialysis days (T, TH, Sa, Su) - renal function limits other GDMT - BNP 12/07/23 was 1576.0  2: HTN- - BP  - continue amlodipine 10mg  daily - saw PCP Dareen Piano) 10/24  3: DM- - saw endocrinology Rosaura Carpenter) 06/19/20 - A1c 05/03/23 was 8.1%  4: ESRD- - BMP 12/07/23 showed sodium 137, potassium 5.0, creatinine 7.82 and GFR 5 - saw nephrology (Kolluru) 11/18/21 - saw vascular Manson Passey) 02/17/23 - dialysis M, W, F

## 2023-12-07 NOTE — Assessment & Plan Note (Signed)
Troponin minimally elevated in the setting of ESRD.  No chest pain reported at this time and EKG is reassuring.  - Continue home aspirin and statin

## 2023-12-07 NOTE — ED Triage Notes (Addendum)
Pt comes via EMs from home with c/o sob. Pt is dialysis. Pt has MWF and port in right arm. Pt hasn't missed any appts. Pt states sob on Monday and worse when she lays flat. Pt states also when ambulating. Lungs clear per EMS. Pt used inhaler today and once during night with no relief.   HR-48 SB BP-136/54

## 2023-12-07 NOTE — Progress Notes (Signed)
[facility]  ROUNDING NOTE   Subjective:   Terry Arroyo is a 62 year old African-American female with past medical history concerning GERD, CAD, systolic CHF, COPD, CVA, and end-stage renal disease on hemodialysis. Patient presents to the emergency department with shortness of breath and loss of balance, and has been admitted under observation for Gait instability [R26.81]  Patient is known to our practice and receives outpatient dialysis treatments at [facility] on a MWF schedule, supervised by Dr Wynelle Link. Patient seen in dialysis suite, preparing to receive treatment. Alert and oriented to self and situation. Does confirm she has been off balance for a few weeks. Denies falls but states she's been running into walls and furniture.   Chest xray shows mild pulmonary congestion. CT head negative for acute findings. MRI brain negative.   We have been consulted to manage dialysis needs during this admission.   Objective:  Vital signs in last 24 hours:  Temp:  [98.2 F (36.8 C)] 98.2 F (36.8 C) (12/18 0820) Pulse Rate:  [45] 45 (12/18 0820) Resp:  [18] 18 (12/18 0820) BP: (126)/(64) 126/64 (12/18 0820) SpO2:  [99 %] 99 % (12/18 0820) Weight:  [72.6 kg] 72.6 kg (12/18 0815)  Weight change:  Filed Weights   12/07/23 0815  Weight: 72.6 kg    Intake/Output: No intake/output data recorded.   Intake/Output this shift:  No intake/output data recorded.  Physical Exam: General: NAD  Head: Normocephalic, atraumatic. Moist oral mucosal membranes  Eyes: Anicteric  Lungs:  Crackles, [place] O2  Heart: Sinus brady  Abdomen:  Soft, nontender,   Extremities:  No peripheral edema.  Neurologic: Alert, oriented x 2, moving all four extremities  Skin: No lesions  Access: Rt AVF    Basic Metabolic Panel: Recent Labs  Lab 12/07/23 0826  NA 137  K 5.0  CL 100  CO2 25  GLUCOSE 149*  BUN 44*  CREATININE 7.82*  CALCIUM 9.0    Liver Function Tests: No results for  input(s): "AST", "ALT", "ALKPHOS", "BILITOT", "PROT", "ALBUMIN" in the last 168 hours. No results for input(s): "LIPASE", "AMYLASE" in the last 168 hours. No results for input(s): "AMMONIA" in the last 168 hours.  CBC: Recent Labs  Lab 12/07/23 0826  WBC 7.9  HGB 10.5*  HCT 33.6*  MCV 96.6  PLT 153    Cardiac Enzymes: No results for input(s): "CKTOTAL", "CKMB", "CKMBINDEX", "TROPONINI" in the last 168 hours.  BNP: Invalid input(s): "POCBNP"  CBG: No results for input(s): "GLUCAP" in the last 168 hours.  Microbiology: Results for orders placed or performed during the hospital encounter of 05/02/23  SARS Coronavirus 2 by RT PCR (hospital order, performed in [facility] hospital lab) *cepheid single result test* Anterior Nasal Swab     Status: None   Collection Time: 05/02/23  3:46 PM   Specimen: Anterior Nasal Swab  Result Value Ref Range Status   SARS Coronavirus 2 by RT PCR NEGATIVE NEGATIVE Final    Comment: (NOTE) SARS-CoV-2 target nucleic acids are NOT DETECTED.  The SARS-CoV-2 RNA is generally detectable in upper and lower respiratory specimens during the acute phase of infection. The lowest concentration of SARS-CoV-2 viral copies this assay can detect is 250 copies / mL. A negative result does not preclude SARS-CoV-2 infection and should not be used as the sole basis for treatment or other patient management decisions.  A negative result may occur with improper specimen collection / handling, submission of specimen other than nasopharyngeal swab, presence of viral  mutation(s) within the areas targeted by this assay, and inadequate number of viral copies (<250 copies / mL). A negative result must be combined with clinical observations, patient history, and epidemiological information.  Fact Sheet for Patients:   RoadLapTop.co.za  Fact Sheet for Healthcare Providers: http://kim-miller.com/  This test is not yet  approved or  cleared by the [place] FDA and has been authorized for detection and/or diagnosis of SARS-CoV-2 by FDA under an Emergency Use Authorization (EUA).  This EUA will remain in effect (meaning this test can be used) for the duration of the COVID-19 declaration under Section 564(b)(1) of the Act, 21 U.S.C. section 360bbb-3(b)(1), unless the authorization is terminated or revoked sooner.  Performed at [facility], [address] [place], [place] [address]     Coagulation Studies: No results for input(s): "LABPROT", "INR" in the last 72 hours.  Urinalysis: No results for input(s): "COLORURINE", "LABSPEC", "PHURINE", "GLUCOSEU", "HGBUR", "BILIRUBINUR", "KETONESUR", "PROTEINUR", "UROBILINOGEN", "NITRITE", "LEUKOCYTESUR" in the last 72 hours.  Invalid input(s): "APPERANCEUR"    Imaging: CT Head Wo Contrast Result Date: 12/07/2023 CLINICAL DATA:  Neuro deficit, acute, stroke suspected. EXAM: CT HEAD WITHOUT CONTRAST TECHNIQUE: Contiguous axial images were obtained from the base of the skull through the vertex without intravenous contrast. RADIATION DOSE REDUCTION: This exam was performed according to the departmental dose-optimization program which includes automated exposure control, adjustment of the mA and/or kV according to patient size and/or use of iterative reconstruction technique. COMPARISON:  Head CT 09/29/2022 FINDINGS: Brain: There is no evidence of an acute infarct, intracranial hemorrhage, mass, midline shift, or extra-axial fluid collection. The ventricles are normal in size. Prominent diffuse dural hyperdensity/calcification along the falx and tentorium is similar to the prior CT. Postsurgical changes are again noted in the sellar region. Vascular: History of widespread chronic dural venous sinus thrombosis. Calcified atherosclerosis at the skull base. Skull: No acute fracture or suspicious osseous lesion. Sinuses/Orbits: Postsurgical changes in the  posterior nasal cavity and sphenoid sinuses. Mild mucosal thickening in the sphenoid and posterior left ethmoid sinuses. Small chronic right mastoid effusion. Right cataract extraction. Other: None. IMPRESSION: No evidence of acute intracranial abnormality. Electronically Signed   By: Sebastian Ache M.D.   On: 12/07/2023 12:27   DG Chest 2 View Result Date: 12/07/2023 CLINICAL DATA:  Shortness of breath. EXAM: CHEST - 2 VIEW COMPARISON:  May 02, 2023. FINDINGS: Mild cardiomegaly is noted with mild central pulmonary vascular congestion. Sternotomy wires are noted. Mild bilateral pulmonary edema may be present. Bony thorax is unremarkable. IMPRESSION: Mild cardiomegaly with mild central pulmonary vascular congestion and possible mild bilateral pulmonary edema. Electronically Signed   By: Lupita Raider M.D.   On: 12/07/2023 08:49     Medications:     [START ON 12/08/2023] Chlorhexidine Gluconate Cloth  6 each Topical Q0600   furosemide  80 mg Intravenous Once   heparin  5,000 Units Subcutaneous Q8H   insulin aspart  0-6 Units Subcutaneous TID WC   insulin glargine-yfgn  5 Units Subcutaneous QHS   sodium chloride flush  3 mL Intravenous Q12H   acetaminophen **OR** acetaminophen, heparin, ipratropium-albuterol, lidocaine-prilocaine, ondansetron **OR** ondansetron (ZOFRAN) IV, pentafluoroprop-tetrafluoroeth, polyethylene glycol  Assessment/ Plan:  [name] is a 62 y.o.  female  with past medical history concerning GERD, CAD, systolic CHF, COPD, CVA, and end-stage renal disease on hemodialysis. Patient presents to the emergency department with   End stage renal disease on hemodialysis. No missed recent treatments. Will receive scheduled treatment today,  UF goal 1.5L as tolerated. Next treatment scheduled for Friday,   2. Anemia of chronic kidney disease Lab Results  Component Value Date   HGB 10.5 (L) 12/07/2023    Hgb remains within desired range.   3. Secondary  Hyperparathyroidism: with outpatient labs: PTH 395, phosphorus 3.2, calcium 9.6 on 11/28/23.   Lab Results  Component Value Date   CALCIUM 9.0 12/07/2023   CAION 0.75 (LL) 05/26/2023   PHOS 4.8 (H) 04/16/2022    Bone minerals acceptable at current levels. Will continue to monitor.   4. Gait instability, over past few weeks. CT negative. MRI Brain negative. Workup in progress.     LOS: 0 Vansh Reckart 12/18/20242:10 PM

## 2023-12-07 NOTE — ED Notes (Signed)
Report given to [facility] RN

## 2023-12-07 NOTE — Assessment & Plan Note (Signed)
Patient reporting several week history of shortness of breath and orthopnea with evidence of pulmonary congestion seen on imaging.  Prior history of HFrEF with last EF in 2022 at 35-40%.  - Dialysis per nephrology - Trial of Lasix given patient still makes urine - Strict in and out - Daily weights - Echocardiogram ordered

## 2023-12-07 NOTE — H&P (Addendum)
History and Physical    Patient: Terry Arroyo DOB: Feb 11, 1961 DOA: 12/07/2023 DOS: the patient was seen and examined on 12/07/2023 PCP: [name], MD  Patient coming from: Home  Chief Complaint:  Chief Complaint  Patient presents with   Shortness of Breath   HPI: Terry Arroyo is a 62 y.o. female with medical history significant of ESRD on HD (MWF), pituitary adenoma complicated by visual deficits s/p resection, cerebellar aneurysm, Type 2 Diabetes mellitus, CAD s/p CABG, post-operative DVT, COPD, HFrEF with last EF of 35-40%, hypertension, HLD, who presents to the ED with c/o SOB and gait instability.   Terry Arroyo states that for the last few weeks, she has been experiencing both shortness of breath and gait instability.  She is also been experiencing bilateral lower extremity weakness that goes up to her mid thigh. She is uncertain if they are related or not.   She notes that shortness of breath is worse when she is laying down to sleep but also occurs at rest and with exertion.  She has not noticed any lower extremity swelling, chest pain, palpitations.  No cough reported.  She also states that for the last few weeks, when she walks she feels off balance and has been running into the wall.  She notes that she leans both sides and is experiencing some left leg weakness/heaviness.  She endorses bilateral subjective numbness up to the mid thigh.  She notes that she is blind in her left eye from prior surgery, but vision in her right eye as well.  She endorses occasional left-sided headache but denies nausea, vomiting.  ED Course:  On arrival to the ED, patient was normotensive at 126/64 with heart rate of 45.  She was saturating at 99% on room air.  She was afebrile at 98.2.  Workup notable for hemoglobin of 10.5, glucose 149, creatinine 7.82, BNP of 1576 and troponin of 26.  Chest x-ray with mild pulmonary congestion.  CT head with no acute abnormalities.  TRH  contacted for further evaluation.  Review of Systems: As mentioned in the history of present illness. All other systems reviewed and are negative.  Past Medical History:  Diagnosis Date   Anemia    Angina at rest [facility])    Angioedema 03/20/2021   Lisinopril   Anterior cerebral aneurysm    approximately 5 mm; left MCA    Anxiety    Aortic atherosclerosis (HCC)    Asthma    Blind left eye    CHF (congestive heart failure) (HCC)    COPD (chronic obstructive pulmonary disease) (HCC)    Coronary artery disease    Depression    DOE (dyspnea on exertion)    ESRD (end stage renal disease) (HCC)    Gait instability    Genital herpes    GERD (gastroesophageal reflux disease)    HFrEF (heart failure with reduced ejection fraction) (HCC)    History of 2019 novel coronavirus disease (COVID-19) 03/2019   [facility] [place]   History of DVT (deep vein thrombosis)    History of DVT (deep vein thrombosis)    Hx of CABG 2018   x 4; performed in [place]   Hyperlipidemia    Hypertension    MI (myocardial infarction) (HCC) 2018   no stents   Pituitary adenoma (HCC)    PVD (posterior vitreous detachment), right eye    Secondary hyperparathyroidism (HCC)    Sleep apnea    non-compliant with  nocturnal PAP therapy   Stroke [facility]) 2014   stated affected left side.   T2DM (type 2 diabetes mellitus) [facility])    Past Surgical History:  Procedure Laterality Date   A/V FISTULAGRAM Right 12/29/2021   Procedure: A/V FISTULAGRAM;  Surgeon: Renford Dills, MD;  Location: [facility] INVASIVE CV LAB;  Service: Cardiovascular;  Laterality: Right;   A/V FISTULAGRAM Right 06/15/2022   Procedure: A/V Fistulagram;  Surgeon: Renford Dills, MD;  Location: [facility] INVASIVE CV LAB;  Service: Cardiovascular;  Laterality: Right;   AV FISTULA PLACEMENT Right 01/28/2021   Procedure: ARTERIOVENOUS (AV) FISTULA CREATION;  Surgeon: Renford Dills, MD;  Location: [facility] ORS;  Service: Vascular;  Laterality:  Right;   AV FISTULA PLACEMENT Right 05/13/2021   Procedure: INSERTION OF ARTERIOVENOUS (AV) GORE-TEX GRAFT ARM ( BRACHIAL AXILLARY );  Surgeon: Renford Dills, MD;  Location: [facility] ORS;  Service: Vascular;  Laterality: Right;   BRAIN SURGERY  1986   pituitary tumor   BREAST EXCISIONAL BIOPSY Left yrs ago   benign   CESAREAN SECTION     COLONOSCOPY N/A 05/26/2023   Procedure: COLONOSCOPY;  Surgeon: Jaynie Collins, DO;  Location: [facility] ENDOSCOPY;  Service: Gastroenterology;  Laterality: N/A;  DIALYSIS   CORONARY ARTERY BYPASS GRAFT  2018   x 4 vessels; performed in [place]   DIALYSIS/PERMA CATHETER INSERTION N/A 11/07/2020   Procedure: DIALYSIS/PERMA CATHETER INSERTION;  Surgeon: Annice Needy, MD;  Location: [facility] INVASIVE CV LAB;  Service: Cardiovascular;  Laterality: N/A;   DIALYSIS/PERMA CATHETER REMOVAL N/A 06/12/2021   Procedure: DIALYSIS/PERMA CATHETER REMOVAL;  Surgeon: Annice Needy, MD;  Location: [facility] INVASIVE CV LAB;  Service: Cardiovascular;  Laterality: N/A;   ESOPHAGOGASTRODUODENOSCOPY (EGD) WITH PROPOFOL N/A 12/22/2021   Procedure: ESOPHAGOGASTRODUODENOSCOPY (EGD) WITH PROPOFOL;  Surgeon: Regis Bill, MD;  Location: [facility] ENDOSCOPY;  Service: Gastroenterology;  Laterality: N/A;  IDDM   EYE SURGERY Right 2021   cataracts    PERIPHERAL VASCULAR THROMBECTOMY Right 04/03/2021   Procedure: A/V Fistulagram ;  Surgeon: Renford Dills, MD;  Location: [facility] INVASIVE CV LAB;  Service: Cardiovascular;  Laterality: Right;   Social History:  reports that she quit smoking about 6 years ago. Her smoking use included cigarettes. She has never used smokeless tobacco. She reports that she does not currently use alcohol. She reports that she does not currently use drugs.  Allergies  Allergen Reactions   Hydralazine Hives, Shortness Of Breath, Swelling and Rash    Body aches    Kiwi Extract Hives, Swelling and Rash    Mouth swells    Lisinopril Swelling    Angioedema   Shellfish  Allergy Anaphylaxis    Shrimp/lobster  Betadine leaves welts on skin     Betadine [Povidone Iodine] Hives and Rash    LEAVES WELTS ON SKIN   Septra [Sulfamethoxazole-Trimethoprim]    Metformin Diarrhea and Other (See Comments)    GI Intolerance   Tape Itching    Use paper tape whenever possible    Family History  Problem Relation Age of Onset   CAD Mother    CAD Brother    Breast cancer Sister 58    Prior to Admission medications   Medication Sig Start Date End Date Taking? Authorizing Provider  acetaminophen (TYLENOL) 500 MG tablet Take 1,000 mg by mouth every 6 (six) hours as needed for mild pain or moderate pain.    [provider]  albuterol (VENTOLIN HFA) 108 (90 Base) MCG/ACT inhaler Inhale 2 puffs  into the lungs every 6 (six) hours as needed for wheezing or shortness of breath.    [provider]  amLODipine (NORVASC) 10 MG tablet Take 1 tablet (10 mg total) by mouth daily. 03/03/20   Arnetha Courser, MD  aspirin 81 MG EC tablet Take 1 tablet (81 mg total) by mouth daily. 02/10/21   Delfino Lovett, MD  atorvastatin (LIPITOR) 80 MG tablet Take 1 tablet (80 mg total) by mouth daily at 6 PM. 11/26/19   Rolly Salter, MD  B Complex-C-Folic Acid (RENA-VITE RX) 1 MG TABS Take 1 tablet by mouth daily. 03/17/23   [provider]  Blood Glucose Monitoring Suppl (TRUE METRIX METER) w/Device KIT  09/02/21   [provider]  calcium acetate (PHOSLO) 667 MG capsule Take by mouth. 11/18/22   [provider]  carvedilol (COREG) 25 MG tablet Take 25 mg by mouth 2 (two) times daily.    [provider]  famotidine (PEPCID) 40 MG tablet Take 1 tablet by mouth as needed for heartburn. 01/28/22   [provider]  furosemide (LASIX) 80 MG tablet Take 80 mg by mouth 4 (four) times a week. Tues, Thurs, Sat, and Sun (non-dialysis days)    [provider]  gabapentin (NEURONTIN) 100 MG capsule Take 100 mg by mouth daily as needed (pain).  10/14/21   [provider]  insulin aspart protamine- aspart (NOVOLOG MIX 70/30) (70-30) 100 UNIT/ML injection Inject 0.15 mLs (15 Units total) into the skin 2 (two) times daily with a meal. Patient taking differently: Inject 15 Units into the skin 3 (three) times daily with meals. 11/12/20   Enedina Finner, MD  lidocaine-prilocaine (EMLA) cream Apply 1 Application topically See admin instructions. Used on Mon Wed and Fri    [provider]  loperamide (IMODIUM) 2 MG capsule Take 2 mg by mouth daily. 08/27/22   [provider]  mirtazapine (REMERON) 30 MG tablet Take 30 mg by mouth at bedtime.    [provider]  polyethylene glycol-electrolytes (NULYTELY) 420 g solution Take 4,000 mLs by mouth daily as needed. 03/29/23   [provider]  riboflavin (VITAMIN B-2) 100 MG TABS tablet Take 2 tablets by mouth daily. 05/19/18   [provider]  topiramate (TOPAMAX) 25 MG tablet Take 25 mg by mouth daily. 08/19/20   [provider]  traZODone (DESYREL) 50 MG tablet Take 0.5 tablets (25 mg total) by mouth at bedtime. Patient taking differently: Take 100 mg by mouth at bedtime. 04/16/22   Leeroy Bock, MD  TRUE METRIX BLOOD GLUCOSE TEST test strip  09/04/21   [provider]  vitamin B-12 (CYANOCOBALAMIN) 1000 MCG tablet Take 1,000 mcg by mouth daily.    [provider]    Physical Exam: Vitals:   12/07/23 1530 12/07/23 1550 12/07/23 1615 12/07/23 1630  BP: (!) 148/66 (!) 139/110 (!) 144/63 (!) 109/99  Pulse: (!) 50 (!) 48 (!) 49 (!) 51  Resp: 13 16 14 16   Temp:      SpO2: 100% 100% 100% 100%  Weight:      Height:       Physical Exam Vitals and nursing note reviewed.  Constitutional:      General: She is not in acute distress.    Appearance: She is obese.  HENT:     Head: Normocephalic and atraumatic.     Mouth/Throat:     Mouth: Mucous membranes are moist.  Eyes:     Comments: Left eye more  sunken in than the  right.  Extraocular movements intact.  Cardiovascular:     Rate and Rhythm: Regular rhythm. Bradycardia present.     Heart sounds: No murmur heard. Pulmonary:     Effort: Pulmonary effort is normal. No tachypnea, accessory muscle usage or respiratory distress.     Breath sounds: Rales (Bibasilar) present. No decreased breath sounds, wheezing or rhonchi.  Abdominal:     Palpations: Abdomen is soft.     Tenderness: There is no abdominal tenderness. There is no guarding.  Musculoskeletal:     Right lower leg: 1+ Pitting Edema present.     Left lower leg: 1+ Pitting Edema present.  Skin:    General: Skin is warm and dry.  Neurological:     Mental Status: She is alert.     Comments:  Patient is alert and oriented X 4, answering questions appropriately 4/5 strength of bilateral lower extremities.  5/5 strength of bilateral upper extremities Sensation grossly intact throughout, subjective sensation difference noted though No facial asymmetry or dysarthria Bilateral finger-to-nose dysmetria noted, left worse than right Intermittent tremor with intention  Psychiatric:        Mood and Affect: Mood normal. Affect is flat.        Behavior: Behavior normal.    Data Reviewed: CBC with WBC of 7.9, hemoglobin of 10.5, MCV of 96.6, platelets of 153 BMP with sodium of 137, potassium 5.0, bicarb 25, glucose 149, BUN 44, creatinine 7.82, GFR 5 BNP 1576 Troponin 26  EKG personally reviewed.  Sinus rhythm with bradycardia at 46.  No AV block.  Normal QTc.  No ST or T wave changes consistent with acute ischemia.  Compared to EKG obtained in May 2024, bradycardia previously present but more pronounced today.  MR BRAIN WO CONTRAST Result Date: 12/07/2023 CLINICAL DATA:  Mental status change, unknown cause. Gait instability. History of pituitary adenoma. EXAM: MRI HEAD WITHOUT CONTRAST TECHNIQUE: Multiplanar, multiecho pulse sequences of the brain and surrounding structures were obtained without  intravenous contrast. COMPARISON:  Head CT 12/07/2023 FINDINGS: Brain: There is no evidence of an acute infarct, midline shift, or extra-axial fluid collection. A few scattered chronic cerebral microhemorrhages are noted. Small T2 hyperintensities in the cerebral white matter nonspecific but compatible with mild chronic small vessel ischemic disease. Mild, smooth diffuse dural thickening is again noted. Sequelae of pituitary tumor resection are again noted without evidence of a gross sellar mass on this routine MRI which does not include dedicated pituitary imaging or IV contrast. Vascular: Abnormal appearance of the dural venous sinuses with remote history of extensive chronic dural venous sinus thrombosis. Major intracranial arterial flow voids are preserved. Skull and upper cervical spine: No suspicious marrow lesion. Sinuses/Orbits: Right cataract extraction and proptosis. Postsurgical changes in the nasal cavity and sphenoid sinus region. Mild mucosal thickening in the paranasal sinuses. Small right mastoid effusion. Other: None. IMPRESSION: 1. No acute intracranial abnormality. 2. Chronic findings as above including mild chronic small vessel ischemic disease. Electronically Signed   By: Sebastian Ache M.D.   On: 12/07/2023 14:42   CT Head Wo Contrast Result Date: 12/07/2023 CLINICAL DATA:  Neuro deficit, acute, stroke suspected. EXAM: CT HEAD WITHOUT CONTRAST TECHNIQUE: Contiguous axial images were obtained from the base of the skull through the vertex without intravenous contrast. RADIATION DOSE REDUCTION: This exam was performed according to the departmental dose-optimization program which includes automated exposure control, adjustment of the mA and/or kV according to patient size and/or use of iterative reconstruction technique. COMPARISON:  Head CT 09/29/2022 FINDINGS: Brain: There is no evidence of an acute infarct, intracranial hemorrhage, mass, midline shift, or extra-axial fluid collection. The  ventricles are normal in size. Prominent diffuse dural hyperdensity/calcification along the falx and tentorium is similar to the prior CT. Postsurgical changes are again noted in the sellar region. Vascular: History of widespread chronic dural venous sinus thrombosis. Calcified atherosclerosis at the skull base. Skull: No acute fracture or suspicious osseous lesion. Sinuses/Orbits: Postsurgical changes in the posterior nasal cavity and sphenoid sinuses. Mild mucosal thickening in the sphenoid and posterior left ethmoid sinuses. Small chronic right mastoid effusion. Right cataract extraction. Other: None. IMPRESSION: No evidence of acute intracranial abnormality. Electronically Signed   By: Sebastian Ache M.D.   On: 12/07/2023 12:27   DG Chest 2 View Result Date: 12/07/2023 CLINICAL DATA:  Shortness of breath. EXAM: CHEST - 2 VIEW COMPARISON:  May 02, 2023. FINDINGS: Mild cardiomegaly is noted with mild central pulmonary vascular congestion. Sternotomy wires are noted. Mild bilateral pulmonary edema may be present. Bony thorax is unremarkable. IMPRESSION: Mild cardiomegaly with mild central pulmonary vascular congestion and possible mild bilateral pulmonary edema. Electronically Signed   By: Lupita Raider M.D.   On: 12/07/2023 08:49   Results are pending, will review when available.  Assessment and Plan:  * Gait instability Patient is presenting with several week history of gait instability that has caused her to run into walls in addition to bilateral lower extremity subjective numbness and left leg weakness.  She has a history of pituitary adenoma s/p resection over 20 years ago, in addition to a MCA aneurysm.  Differential includes intracranial abnormality versus vitamin deficiency vs severe diabetic neuropathy.  Given headache, could be atypical migraine.   - Telemetry monitoring - MRI brain without contrast ordered - Pending MRI results, will consult neurology - Vitamin B12, vitamin B1, thiamine  levels pending - PT/OT  Acute on chronic systolic CHF (congestive heart failure) (HCC) Patient reporting several week history of shortness of breath and orthopnea with evidence of pulmonary congestion seen on imaging.  Prior history of HFrEF with last EF in 2022 at 35-40%.  - Dialysis per nephrology - Trial of Lasix given patient still makes urine - Strict in and out - Daily weights - Echocardiogram ordered  End-stage renal disease on hemodialysis [facility]) - Nephrology consulted; appreciate their recommendations  Essential hypertension Blood pressure within goal at this time.  - Continue home carvedilol, amlodipine  COPD (chronic obstructive pulmonary disease) with emphysema (HCC) Shortness of breath is not suspected to be due to underlying COPD given evidence of hypervolemia.  No wheezing on examination.  - Continue home bronchodilators - DuoNebs as needed  Type 2 diabetes mellitus with hyperlipidemia (HCC) - Hold home antiglycemic regimen - SSI, very sensitive - Semglee 5 units at bedtime  CAD (coronary artery disease) Troponin minimally elevated in the setting of ESRD.  No chest pain reported at this time and EKG is reassuring.  - Continue home aspirin and statin  Sinus bradycardia Chronic but appears more pronounced today.  Unclear if this is contributing to patient's gait instability.  - Increase home carvedilol from 25 to 12.5 mg BID  Advance Care Planning:   Code Status: Full Code   Consults: None  Family Communication: No family at bedside  Severity of Illness: The appropriate patient status for this patient is OBSERVATION. Observation status is judged to be reasonable and necessary in order to provide the required intensity of service to ensure the patient's  safety. The patient's presenting symptoms, physical exam findings, and initial radiographic and laboratory data in the context of their medical condition is felt to place them at decreased risk for further  clinical deterioration. Furthermore, it is anticipated that the patient will be medically stable for discharge from the hospital within 2 midnights of admission.   Author: Verdene Lennert, MD 12/07/2023 4:55 PM  For on call review [email or web address].

## 2023-12-07 NOTE — Assessment & Plan Note (Signed)
Blood pressure within goal at this time.  - Continue home carvedilol, amlodipine

## 2023-12-07 NOTE — Assessment & Plan Note (Signed)
-   Hold home antiglycemic regimen - SSI, very sensitive - Semglee 5 units at bedtime

## 2023-12-07 NOTE — Progress Notes (Signed)
Received patient in bed to unit.    Informed consent signed and in chart.    TX duration:  3 hrs     Transported back to floor  Hand-off given to patient's nurse.   Access used:  rt arm AVF Access issues: n/a  Total UF removed: 2400 mls      Maple Hudson, RN Dialysis Unit

## 2023-12-07 NOTE — ED Notes (Signed)
IV team at bedside 

## 2023-12-07 NOTE — ED Provider Notes (Signed)
[facility] Provider Note    Event Date/Time   First MD Initiated Contact with Patient 12/07/23 1014     (approximate)   History   Shortness of Breath   HPI  Terry Arroyo is a 62 y.o. female past medical history significant for ESRD on HD MWF, who presents to the emergency department with gait instability and dizziness.  Patient states that she did not go to dialysis today because she is feeling off balance and short of breath.  States that today whenever she got up she walked into a wall secondary to feeling off balance and like she was leaning to 1 side.  Denies any significant headache.  Does state that she has had change in vision but does report blindness in her left eye.  Denies any chest pain or shortness of breath.  Denies any nausea or vomiting.  Last dialysis session was on Monday.  Does still make urine.     Physical Exam   Triage Vital Signs: ED Triage Vitals  Encounter Vitals Group     BP 12/07/23 0820 126/64     Systolic BP Percentile --      Diastolic BP Percentile --      Pulse Rate 12/07/23 0820 (!) 45     Resp 12/07/23 0820 18     Temp 12/07/23 0820 98.2 F (36.8 C)     Temp src --      SpO2 12/07/23 0820 99 %     Weight 12/07/23 0815 160 lb (72.6 kg)     Height 12/07/23 0815 5\' 4"  (1.626 m)     Head Circumference --      Peak Flow --      Pain Score 12/07/23 0815 0     Pain Loc --      Pain Education --      Exclude from Growth Chart --     Most recent vital signs: Vitals:   12/07/23 0820 12/07/23 1530  BP: 126/64 (!) 148/66  Pulse: (!) 45 (!) 50  Resp: 18 13  Temp: 98.2 F (36.8 C)   SpO2: 99% 100%    Physical Exam Constitutional:      Appearance: She is well-developed.  HENT:     Head: Atraumatic.     Comments: Proptosis to the right eye which patient states is chronic.  Blind in the left eye.  Right eye with no peripheral vision deficit.  Extraocular movements intact. Eyes:     Conjunctiva/sclera:  Conjunctivae normal.  Cardiovascular:     Rate and Rhythm: Regular rhythm. Bradycardia present.  Pulmonary:     Effort: Tachypnea present.  Abdominal:     General: There is no distension.     Palpations: Abdomen is soft.     Tenderness: There is no abdominal tenderness.  Musculoskeletal:        General: Normal range of motion.     Cervical back: Normal range of motion.  Skin:    General: Skin is warm.  Neurological:     Mental Status: She is alert. Mental status is at baseline.     GCS: GCS eye subscore is 4. GCS verbal subscore is 5. GCS motor subscore is 6.     Cranial Nerves: Cranial nerves 2-12 are intact.     Sensory: Sensation is intact.     Motor: Motor function is intact.     Gait: Gait abnormal.     Comments: Significant gait instability, falling to the right side.  IMPRESSION / MDM / ASSESSMENT AND PLAN / ED COURSE  I reviewed the triage vital signs and the nursing notes.  Differential diagnosis including intracranial hemorrhage, CVA, dissection, heart failure exacerbation, anemia, symptomatic bradycardia  Patient has a history on chart review of MCA aneurysm and ESRD.  EKG  I, Corena Herter, the attending physician, personally viewed and interpreted this ECG.  Sinus bradycardia with heart rate of 46.  No significant ST elevation or depression.  No signs of acute ischemia or dysrhythmia.  Normal intervals.  No signs of heart block.  Sinus bradycardia  while on cardiac telemetry.  RADIOLOGY I independently reviewed imaging, my interpretation of imaging: CT scan of the head without signs of intracranial hemorrhage or infarction.  Chest x-ray with signs of pulmonary edema.  LABS (all labs ordered are listed, but only abnormal results are displayed) Labs interpreted as -    Labs Reviewed  BASIC METABOLIC PANEL - Abnormal; Notable for the following components:      Result Value   Glucose, Bld 149 (*)    BUN 44 (*)    Creatinine, Ser 7.82 (*)    GFR,  Estimated 5 (*)    All other components within normal limits  CBC - Abnormal; Notable for the following components:   RBC 3.48 (*)    Hemoglobin 10.5 (*)    HCT 33.6 (*)    All other components within normal limits  BRAIN NATRIURETIC PEPTIDE - Abnormal; Notable for the following components:   B Natriuretic Peptide 1,576.0 (*)    All other components within normal limits  TROPONIN I (HIGH SENSITIVITY) - Abnormal; Notable for the following components:   Troponin I (High Sensitivity) 26 (*)    All other components within normal limits  HIV ANTIBODY (ROUTINE TESTING W REFLEX)  VITAMIN B12  VITAMIN B1  TSH  HEPATITIS B SURFACE ANTIBODY, QUANTITATIVE  HEPATITIS B SURFACE ANTIGEN     MDM    Patient missed dialysis today.  Does have been elevated BNP.  Troponin is elevated but is at her baseline and likely elevated secondary to her CKD.  Creatinine appears to be at her baseline.  Does not meet criteria for emergent dialysis.  CT scan of the head without signs of intracranial hemorrhage.  Given the patient's significant gait instability concern for possible CVA.  Trouble with ambulation in the emergency department.  Consulted hospitalist for admission for gait instability and weakness.   PROCEDURES:  Critical Care performed: No  Procedures  Patient's presentation is most consistent with acute presentation with potential threat to life or bodily function.   MEDICATIONS ORDERED IN ED: Medications  heparin injection 5,000 Units (has no administration in time range)  sodium chloride flush (NS) 0.9 % injection 3 mL (3 mLs Intravenous Given 12/07/23 1321)  acetaminophen (TYLENOL) tablet 650 mg (has no administration in time range)    Or  acetaminophen (TYLENOL) suppository 650 mg (has no administration in time range)  ondansetron (ZOFRAN) tablet 4 mg (has no administration in time range)    Or  ondansetron (ZOFRAN) injection 4 mg (has no administration in time range)  polyethylene  glycol (MIRALAX / GLYCOLAX) packet 17 g (has no administration in time range)  ipratropium-albuterol (DUONEB) 0.5-2.5 (3) MG/3ML nebulizer solution 3 mL (has no administration in time range)  furosemide (LASIX) injection 80 mg (has no administration in time range)  insulin aspart (novoLOG) injection 0-6 Units (has no administration in time range)  insulin glargine-yfgn (SEMGLEE) injection 5 Units (has no administration  in time range)  Chlorhexidine Gluconate Cloth 2 % PADS 6 each (has no administration in time range)  pentafluoroprop-tetrafluoroeth (GEBAUERS) aerosol 1 Application (has no administration in time range)  lidocaine-prilocaine (EMLA) cream 1 Application (has no administration in time range)  heparin injection 1,000 Units (has no administration in time range)    FINAL CLINICAL IMPRESSION(S) / ED DIAGNOSES   Final diagnoses:  SOB (shortness of breath)  Acute pulmonary edema (HCC)  Gait instability     Rx / DC Orders   ED Discharge Orders     None        Note:  This document was prepared using Dragon voice recognition software and may include unintentional dictation errors.   Corena Herter, MD 12/07/23 [phone]

## 2023-12-07 NOTE — Assessment & Plan Note (Signed)
-   Nephrology consulted; appreciate their recommendations.  Will be dialyzed today 

## 2023-12-07 NOTE — Assessment & Plan Note (Signed)
Shortness of breath is not suspected to be due to underlying COPD given evidence of hypervolemia.  No wheezing on examination.  - Continue home bronchodilators - DuoNebs as needed

## 2023-12-07 NOTE — Assessment & Plan Note (Addendum)
Patient is presenting with several week history of gait instability that has caused her to run into walls in addition to bilateral lower extremity subjective numbness and left leg weakness.  She has a history of pituitary adenoma s/p resection over 20 years ago, in addition to a MCA aneurysm.  Differential includes intracranial abnormality versus vitamin deficiency vs severe diabetic neuropathy.  Given headache, could be atypical migraine.   - Telemetry monitoring - MRI brain without contrast ordered - Pending MRI results, will consult neurology - Vitamin B12, vitamin B1, thiamine levels pending - PT/OT

## 2023-12-07 NOTE — Assessment & Plan Note (Signed)
Chronic but appears more pronounced today.  Unclear if this is contributing to patient's gait instability.  - Increase home carvedilol from 25 to 12.5 mg BID

## 2023-12-07 NOTE — ED Provider Triage Note (Signed)
Emergency Medicine Provider Triage Evaluation Note  Terry Arroyo , a 62 y.o. female  was evaluated in triage.  Pt complains of shortness of breath . Arrives via EMS. Did not go to dialysis today.  SOB worse with lying flat.    Review of Systems  Positive: SOB+  Negative: No CP  Physical Exam  Ht 5\' 4"  (1.626 m)   Wt 72.6 kg   BMI 27.46 kg/m  Gen:   Awake, no distress  Talkative Resp:  Normal effort  Lungs clear MSK:   Moves extremities without difficulty  Other:    Medical Decision Making  Medically screening exam initiated at 8:19 AM.  Appropriate orders placed.  IYSIS ABEGG was informed that the remainder of the evaluation will be completed by another provider, this initial triage assessment does not replace that evaluation, and the importance of remaining in the ED until their evaluation is complete.     Tommi Rumps, PA-C 12/07/23 [phone]

## 2023-12-08 ENCOUNTER — Observation Stay
Admit: 2023-12-08 | Discharge: 2023-12-08 | Disposition: A | Discharge status: Home / Self Care | Payer: 59 | Attending: Internal Medicine | Admitting: Internal Medicine

## 2023-12-08 ENCOUNTER — Encounter: Payer: 59 | Admitting: Family

## 2023-12-08 DIAGNOSIS — R262 Difficulty in walking, not elsewhere classified: Secondary | ICD-10-CM | POA: Diagnosis present

## 2023-12-08 DIAGNOSIS — R2681 Unsteadiness on feet: Secondary | ICD-10-CM | POA: Diagnosis not present

## 2023-12-08 LAB — CK: Total CK: 63 U/L (ref 38–234)

## 2023-12-08 LAB — ECHOCARDIOGRAM COMPLETE
AR max vel: 2.3 cm2
AV Area VTI: 2.32 cm2
AV Area mean vel: 2.48 cm2
AV Mean grad: 7 mm[Hg]
AV Peak grad: 14.4 mm[Hg]
Ao pk vel: 1.9 m/s
Area-P 1/2: 3.74 cm2
Calc EF: 49.9 %
Height: 64 in
MV VTI: 2.44 cm2
S' Lateral: 4.5 cm
Single Plane A2C EF: 52.7 %
Single Plane A4C EF: 47.8 %
Weight: 2560 [oz_av]

## 2023-12-08 LAB — CBG MONITORING, ED
Glucose-Capillary: 225 mg/dL — ABNORMAL HIGH (ref 70–99)
Glucose-Capillary: 210 mg/dL — ABNORMAL HIGH (ref 70–99)

## 2023-12-08 LAB — BASIC METABOLIC PANEL
Anion gap: 12 (ref 5–15)
BUN: 36 mg/dL — ABNORMAL HIGH (ref 8–23)
CO2: 25 mmol/L (ref 22–32)
Calcium: 8.5 mg/dL — ABNORMAL LOW (ref 8.9–10.3)
Chloride: 98 mmol/L (ref 98–111)
Creatinine, Ser: 6.1 mg/dL — ABNORMAL HIGH (ref 0.44–1.00)
GFR, Estimated: 7 mL/min — ABNORMAL LOW (ref >60)
Glucose, Bld: 256 mg/dL — ABNORMAL HIGH (ref 70–99)
Potassium: 4.1 mmol/L (ref 3.5–5.1)
Sodium: 135 mmol/L (ref 135–145)

## 2023-12-08 LAB — CBC
HCT: 31.8 % — ABNORMAL LOW (ref 36.0–46.0)
Hemoglobin: 10.4 g/dL — ABNORMAL LOW (ref 12.0–15.0)
MCH: 30.5 pg (ref 26.0–34.0)
MCHC: 32.7 g/dL (ref 30.0–36.0)
MCV: 93.3 fL (ref 80.0–100.0)
Platelets: 167 10*3/uL (ref 150–400)
RBC: 3.41 MIL/uL — ABNORMAL LOW (ref 3.87–5.11)
RDW: 14.5 % (ref 11.5–15.5)
WBC: 7.7 10*3/uL (ref 4.0–10.5)
nRBC: 0 % (ref 0.0–0.2)

## 2023-12-08 LAB — HIV ANTIBODY (ROUTINE TESTING W REFLEX): HIV Screen 4th Generation wRfx: NONREACTIVE

## 2023-12-08 LAB — HEPATITIS B SURFACE ANTIBODY, QUANTITATIVE: Hep B S AB Quant (Post): 50.6 m[IU]/mL

## 2023-12-08 LAB — VITAMIN B12: Vitamin B-12: 174 pg/mL — ABNORMAL LOW (ref 180–914)

## 2023-12-08 MED ORDER — ACETAMINOPHEN 325 MG PO TABS: 650.0000 mg | ORAL_TABLET | Freq: Four times a day (QID) | ORAL | Status: DC | PRN

## 2023-12-08 MED ORDER — ACETAMINOPHEN 500 MG PO TABS
1000.0000 mg | ORAL_TABLET | Freq: Once | ORAL | Status: AC
  Administered 2023-12-08: 1000 mg via ORAL
  Filled 2023-12-08: qty 2

## 2023-12-08 MED ORDER — SENNOSIDES-DOCUSATE SODIUM 8.6-50 MG PO TABS: 1.0000 | ORAL_TABLET | Freq: Every evening | ORAL | Status: DC | PRN

## 2023-12-08 MED ORDER — METHOCARBAMOL 500 MG PO TABS: 500.0000 mg | ORAL_TABLET | Freq: Two times a day (BID) | ORAL | 0 refills | Status: DC | PRN

## 2023-12-08 MED ORDER — HYDRALAZINE HCL 20 MG/ML IJ SOLN: 10.0000 mg | INTRAMUSCULAR | Status: DC | PRN

## 2023-12-08 MED ORDER — METHOCARBAMOL 500 MG PO TABS
500.0000 mg | ORAL_TABLET | Freq: Once | ORAL | Status: AC
  Administered 2023-12-08: 500 mg via ORAL
  Filled 2023-12-08: qty 1

## 2023-12-08 MED ORDER — METOPROLOL TARTRATE 5 MG/5ML IV SOLN: 5.0000 mg | INTRAVENOUS | Status: DC | PRN

## 2023-12-08 NOTE — Discharge Planning (Signed)
ESTABLISHED HEMODIALYSIS Outpatient Facility: [facility]  [address] [place]. [place], [place] [address] [phone]  Scheduled days: Monday Wednesday and Friday  Treatment time: 11:15am  Dimas Chyle Dialysis Coordinator II  [facility] Cell: [phone] eFax: [phone] [email or web address]

## 2023-12-08 NOTE — Progress Notes (Signed)
*  PRELIMINARY RESULTS* Echocardiogram 2D Echocardiogram has been performed.  Terry Arroyo 12/08/2023, 12:56 PM

## 2023-12-08 NOTE — ED Notes (Signed)
Notified Jon Billings NP of bilateral leg pain, orders given to administer 1000mg  of tylenol and robaxin.

## 2023-12-08 NOTE — Evaluation (Signed)
Physical Therapy Evaluation Patient Details Name: Terry Arroyo MRN: [id number] DOB: [birth year] Today's Date: 12/08/2023  History of Present Illness  Terry Arroyo is a 62yoF who comes to [facility] on 12/07[facility]23 c SOB and unsteadiness in gait over last few weeks. BNP 1576. PMH: ESRD on HD MWF, pituitary adenoma complicated by visual deficits s/p resection, cerebellar aneurysm, Type 2 Diabetes mellitus, CAD s/p CABG, post-operative DVT, COPD, HFrEF with last EF of 35-40%, hypertension, HLD.  Clinical Impression  Pt in ED on evaluation. Pt reports feeling much closer to baseline now (dialyzed yesterday); no physical assistance needed for basic mobility. Balance deficits are chronic, known, and anticipated. Pt feels safe using a walker to perform her typical mobility needs. Pt would like to go home now. Discussed HHPT to kickstart a great balance HEP. No DME needs.       If plan is discharge home, recommend the following:     Can travel by private vehicle        Equipment Recommendations None recommended by PT  Recommendations for Other Services       Functional Status Assessment Patient has had a recent decline in their functional status and demonstrates the ability to make significant improvements in function in a reasonable and predictable amount of time.     Precautions / Restrictions Precautions Precautions: Fall Restrictions Weight Bearing Restrictions Per Provider Order: No      Mobility  Bed Mobility Overal bed mobility: Modified Independent                  Transfers Overall transfer level: Modified independent Equipment used: None                    Ambulation/Gait Ambulation/Gait assistance: Supervision Gait Distance (Feet): 200 Feet Assistive device: Rolling walker (2 wheels) Gait Pattern/deviations: WFL(Within Functional Limits)       General Gait Details: feels safe with homemobility needs  Stairs            Wheelchair Mobility      Tilt Bed    Modified Rankin (Stroke Patients Only)       Balance Overall balance assessment: Modified Independent (able to marching in place unsupported; able to alternate fwd/backward AMB unsupported a couple mild LOB)                                           Pertinent Vitals/Pain Pain Assessment Pain Assessment: No/denies pain    Home Living Family/patient expects to be discharged to:: Private residence Living Arrangements: Alone Available Help at Discharge:  (Sister lives in [place]; father is 90yoM drives) Type of Home: Apartment Home Access: Elevator       Home Layout: One level Home Equipment: Agricultural consultant (2 wheels);Rollator (4 wheels);Cane - single point;Shower seat;BSC/3in1      Prior Function Prior Level of Function : Independent/Modified Independent             Mobility Comments: takes bus to HD, has groceries delivered ADLs Comments: modI     Extremity/Trunk Assessment                Communication      Cognition Arousal: Alert Behavior During Therapy: WFL for tasks assessed/performed Overall Cognitive Status: Within Functional Limits for tasks assessed  General Comments      Exercises     Assessment/Plan    PT Assessment Patient needs continued PT services  PT Problem List Decreased strength;Decreased activity tolerance;Decreased balance;Decreased mobility       PT Treatment Interventions DME instruction;Gait training;Functional mobility training;Stair training;Therapeutic activities;Therapeutic exercise;Balance training    PT Goals (Current goals can be found in the Care Plan section)  Acute Rehab PT Goals Patient Stated Goal: return to home, avoid falls, limit fluids PT Goal Formulation: With patient Time For Goal Achievement: 12/22/23 Potential to Achieve Goals: Good    Frequency Min 1X/week     Co-evaluation               AM-PAC  PT "6 Clicks" Mobility  Outcome Measure Help needed turning from your back to your side while in a flat bed without using bedrails?: None Help needed moving from lying on your back to sitting on the side of a flat bed without using bedrails?: None Help needed moving to and from a bed to a chair (including a wheelchair)?: None Help needed standing up from a chair using your arms (e.g., wheelchair or bedside chair)?: None Help needed to walk in hospital room?: A Little Help needed climbing 3-5 steps with a railing? : A Little 6 Click Score: 22    End of Session Equipment Utilized During Treatment: Gait belt Activity Tolerance: Patient tolerated treatment well;No increased pain Patient left: in bed;with call bell/phone within reach Nurse Communication: Mobility status PT Visit Diagnosis: Other abnormalities of gait and mobility (R26.89);Unsteadiness on feet (R26.81);Muscle weakness (generalized) (M62.81)    Time: 9604-5409 PT Time Calculation (min) (ACUTE ONLY): 18 min   Charges:   PT Evaluation $PT Eval Low Complexity: 1 Low   PT General Charges $$ ACUTE PT VISIT: 1 Visit        10:46 AM, 12/08/23 Rosamaria Lints, PT, DPT Physical Therapist - [facility]  [phone] (ASCOM)    Dyllin Gulley C 12/08/2023, 10:45 AM

## 2023-12-08 NOTE — Progress Notes (Signed)
OT Cancellation Note  Patient Details Name: Terry Arroyo MRN: [id number] DOB: [birth year]   Cancelled Treatment:    Reason Eval/Treat Not Completed: OT screened, no needs identified, will sign off. Chart reviewed, pt seen ambulatory in hallway with PT this AM. No skilled acute OT needs identified, will sign off.   Kathie Dike, M.S. OTR/L  12/08/23, 2:12 PM  ascom [phone]

## 2023-12-08 NOTE — Progress Notes (Signed)
[facility]  ROUNDING NOTE   Subjective:   Terry Arroyo is a 62 year old African-American female with past medical history concerning GERD, CAD, systolic CHF, COPD, CVA, and end-stage renal disease on hemodialysis. Patient presents to the emergency department with shortness of breath and loss of balance, and has been admitted under observation for Gait instability [R26.81] Ambulatory dysfunction [R26.2]  Patient is known to our practice and receives outpatient dialysis treatments at [facility] on a MWF schedule, supervised by Dr Wynelle Link.   Patient seen resting on bed Alert  States she feels well today Requesting discharge.   Objective:  Vital signs in last 24 hours:  Temp:  [97.7 F (36.5 C)-98.7 F (37.1 C)] 98.1 F (36.7 C) (12/19 1408) Pulse Rate:  [47-69] 52 (12/19 1408) Resp:  [15-27] 18 (12/19 1408) BP: (105-149)/(48-123) 133/61 (12/19 1408) SpO2:  [95 %-100 %] 98 % (12/19 1408)  Weight change:  Filed Weights   12/07/23 0815  Weight: 72.6 kg    Intake/Output: I/O last 3 completed shifts: In: -  Out: 2400 [Other:2400]   Intake/Output this shift:  No intake/output data recorded.  Physical Exam: General: NAD  Head: Normocephalic, atraumatic. Moist oral mucosal membranes  Eyes: Anicteric  Lungs:  Diminished  Heart: Sinus brady  Abdomen:  Soft, nontender,   Extremities:  No peripheral edema.  Neurologic: Alert, oriented , moving all four extremities  Skin: No lesions  Access: Rt AVF    Basic Metabolic Panel: Recent Labs  Lab 12/07/23 0826 12/08/23 0620  NA 137 135  K 5.0 4.1  CL 100 98  CO2 25 25  GLUCOSE 149* 256*  BUN 44* 36*  CREATININE 7.82* 6.10*  CALCIUM 9.0 8.5*    Liver Function Tests: No results for input(s): "AST", "ALT", "ALKPHOS", "BILITOT", "PROT", "ALBUMIN" in the last 168 hours. No results for input(s): "LIPASE", "AMYLASE" in the last 168 hours. No results for input(s): "AMMONIA" in the last 168  hours.  CBC: Recent Labs  Lab 12/07/23 0826 12/08/23 0620  WBC 7.9 7.7  HGB 10.5* 10.4*  HCT 33.6* 31.8*  MCV 96.6 93.3  PLT 153 167    Cardiac Enzymes: Recent Labs  Lab 12/08/23 0620  CKTOTAL 63    BNP: Invalid input(s): "POCBNP"  CBG: Recent Labs  Lab 12/07/23 2028 12/08/23 0736 12/08/23 1203  GLUCAP 233* 225* 210*    Microbiology: Results for orders placed or performed during the hospital encounter of 05/02/23  SARS Coronavirus 2 by RT PCR (hospital order, performed in [facility] hospital lab) *cepheid single result test* Anterior Nasal Swab     Status: None   Collection Time: 05/02/23  3:46 PM   Specimen: Anterior Nasal Swab  Result Value Ref Range Status   SARS Coronavirus 2 by RT PCR NEGATIVE NEGATIVE Final    Comment: (NOTE) SARS-CoV-2 target nucleic acids are NOT DETECTED.  The SARS-CoV-2 RNA is generally detectable in upper and lower respiratory specimens during the acute phase of infection. The lowest concentration of SARS-CoV-2 viral copies this assay can detect is 250 copies / mL. A negative result does not preclude SARS-CoV-2 infection and should not be used as the sole basis for treatment or other patient management decisions.  A negative result may occur with improper specimen collection / handling, submission of specimen other than nasopharyngeal swab, presence of viral mutation(s) within the areas targeted by this assay, and inadequate number of viral copies (<250 copies / mL). A negative result must be combined with clinical observations,  patient history, and epidemiological information.  Fact Sheet for Patients:   RoadLapTop.co.za  Fact Sheet for Healthcare Providers: http://kim-miller.com/  This test is not yet approved or  cleared by the [place] FDA and has been authorized for detection and/or diagnosis of SARS-CoV-2 by FDA under an Emergency Use Authorization (EUA).  This EUA will  remain in effect (meaning this test can be used) for the duration of the COVID-19 declaration under Section 564(b)(1) of the Act, 21 U.S.C. section 360bbb-3(b)(1), unless the authorization is terminated or revoked sooner.  Performed at [facility], [address] [place], [place] [address]     Coagulation Studies: No results for input(s): "LABPROT", "INR" in the last 72 hours.  Urinalysis: No results for input(s): "COLORURINE", "LABSPEC", "PHURINE", "GLUCOSEU", "HGBUR", "BILIRUBINUR", "KETONESUR", "PROTEINUR", "UROBILINOGEN", "NITRITE", "LEUKOCYTESUR" in the last 72 hours.  Invalid input(s): "APPERANCEUR"    Imaging: MR BRAIN WO CONTRAST Result Date: 12/07/2023 CLINICAL DATA:  Mental status change, unknown cause. Gait instability. History of pituitary adenoma. EXAM: MRI HEAD WITHOUT CONTRAST TECHNIQUE: Multiplanar, multiecho pulse sequences of the brain and surrounding structures were obtained without intravenous contrast. COMPARISON:  Head CT 12/07/2023 FINDINGS: Brain: There is no evidence of an acute infarct, midline shift, or extra-axial fluid collection. A few scattered chronic cerebral microhemorrhages are noted. Small T2 hyperintensities in the cerebral white matter nonspecific but compatible with mild chronic small vessel ischemic disease. Mild, smooth diffuse dural thickening is again noted. Sequelae of pituitary tumor resection are again noted without evidence of a gross sellar mass on this routine MRI which does not include dedicated pituitary imaging or IV contrast. Vascular: Abnormal appearance of the dural venous sinuses with remote history of extensive chronic dural venous sinus thrombosis. Major intracranial arterial flow voids are preserved. Skull and upper cervical spine: No suspicious marrow lesion. Sinuses/Orbits: Right cataract extraction and proptosis. Postsurgical changes in the nasal cavity and sphenoid sinus region. Mild mucosal thickening in the paranasal  sinuses. Small right mastoid effusion. Other: None. IMPRESSION: 1. No acute intracranial abnormality. 2. Chronic findings as above including mild chronic small vessel ischemic disease. Electronically Signed   By: Sebastian Ache M.D.   On: 12/07/2023 14:42   CT Head Wo Contrast Result Date: 12/07/2023 CLINICAL DATA:  Neuro deficit, acute, stroke suspected. EXAM: CT HEAD WITHOUT CONTRAST TECHNIQUE: Contiguous axial images were obtained from the base of the skull through the vertex without intravenous contrast. RADIATION DOSE REDUCTION: This exam was performed according to the departmental dose-optimization program which includes automated exposure control, adjustment of the mA and/or kV according to patient size and/or use of iterative reconstruction technique. COMPARISON:  Head CT 09/29/2022 FINDINGS: Brain: There is no evidence of an acute infarct, intracranial hemorrhage, mass, midline shift, or extra-axial fluid collection. The ventricles are normal in size. Prominent diffuse dural hyperdensity/calcification along the falx and tentorium is similar to the prior CT. Postsurgical changes are again noted in the sellar region. Vascular: History of widespread chronic dural venous sinus thrombosis. Calcified atherosclerosis at the skull base. Skull: No acute fracture or suspicious osseous lesion. Sinuses/Orbits: Postsurgical changes in the posterior nasal cavity and sphenoid sinuses. Mild mucosal thickening in the sphenoid and posterior left ethmoid sinuses. Small chronic right mastoid effusion. Right cataract extraction. Other: None. IMPRESSION: No evidence of acute intracranial abnormality. Electronically Signed   By: Sebastian Ache M.D.   On: 12/07/2023 12:27   DG Chest 2 View Result Date: 12/07/2023 CLINICAL DATA:  Shortness of breath. EXAM: CHEST - 2 VIEW COMPARISON:  May 02, 2023. FINDINGS: Mild cardiomegaly is noted with mild central pulmonary vascular congestion. Sternotomy wires are noted. Mild bilateral  pulmonary edema may be present. Bony thorax is unremarkable. IMPRESSION: Mild cardiomegaly with mild central pulmonary vascular congestion and possible mild bilateral pulmonary edema. Electronically Signed   By: Lupita Raider M.D.   On: 12/07/2023 08:49     Medications:     amLODipine  10 mg Oral Daily   aspirin EC  81 mg Oral Daily   atorvastatin  80 mg Oral QHS   calcium acetate  1,334 mg Oral TID WC   carvedilol  12.5 mg Oral BID WC   Chlorhexidine Gluconate Cloth  6 each Topical Q0600   cyanocobalamin  1,000 mcg Oral Daily   gabapentin  300 mg Oral QHS   heparin  5,000 Units Subcutaneous Q8H   insulin aspart  0-6 Units Subcutaneous TID WC   insulin glargine-yfgn  5 Units Subcutaneous QHS   mirtazapine  30 mg Oral QHS   sodium chloride flush  3 mL Intravenous Q12H   topiramate  50 mg Oral Daily   traZODone  100 mg Oral QHS   acetaminophen **OR** acetaminophen, famotidine, hydrALAZINE, ipratropium-albuterol, metoprolol tartrate, ondansetron **OR** ondansetron (ZOFRAN) IV, polyethylene glycol, senna-docusate  Assessment/ Plan:  Ms. JAIDEE MARANDA is a 62 y.o.  female  with past medical history concerning GERD, CAD, systolic CHF, COPD, CVA, and end-stage renal disease on hemodialysis. Patient presents to the emergency department with   End stage renal disease on hemodialysis. Dialysis received yesterday, UF 2.4L achieved. Next treatment scheduled for Friday.  2. Anemia of chronic kidney disease Lab Results  Component Value Date   HGB 10.4 (L) 12/08/2023    Hgb at goal.   3. Secondary Hyperparathyroidism: with outpatient labs: PTH 395, phosphorus 3.2, calcium 9.6 on 11/28/23.   Lab Results  Component Value Date   CALCIUM 8.5 (L) 12/08/2023   CAION 0.75 (LL) 05/26/2023   PHOS 4.8 (H) 04/16/2022    Bone minerals acceptable at current levels. Will continue to monitor.   4. Gait instability, over past few weeks. CT negative. MRI Brain negative. Continue to follow up with  outpatient neurology.    LOS: 0 Way[facility] Laser 12/19/20244:23 PM

## 2023-12-08 NOTE — Plan of Care (Signed)
Discussed with Dr. Nelson Chimes via secure chat, patient noted to be back at baseline on PT eval and he feels she is ready for discharge without inpatient neurology evalution  Brooke Dare MD-PhD [facility] [phone]

## 2023-12-08 NOTE — Discharge Instructions (Signed)
Follow-up outpatient primary care physician next 1-2 weeks Robaxin given to help as needed for muscle spasm.  Refills will further be given by PCP if necessary Resume outpatient hemodialysis

## 2023-12-08 NOTE — Hospital Course (Addendum)
Brief Narrative:  62 year old with history of ESRD on HD MWF, pituitary adenoma with visual deficits s/p resection, cerebral aneurysm, DM2, CAD status post CABG, postop DVT, CHF with reduced EF 35%, HTN, HLD comes to the hospital with shortness of breath and gait instability.  Patient is also experienced some shortness of breath especially orthopnea over the last few weeks.  CT of the head negative, chest x-ray showed slight volume overload with elevated BNP. After a session of dialysis she feels great this morning without any complaints.  Feeling back to her baseline and very steady.  No other complaints at this time.  With physical therapy she was able to ambulate without any issues. Medically stable for discharge.  Assessment & Plan:  Principal Problem:   Gait instability Active Problems:   Acute on chronic systolic CHF (congestive heart failure) (HCC)   End-stage renal disease on hemodialysis (HCC)   Essential hypertension   COPD (chronic obstructive pulmonary disease) with emphysema (HCC)   Type 2 diabetes mellitus with hyperlipidemia (HCC)   CAD (coronary artery disease)   Sinus bradycardia    * Gait instability, improved This appears to be progressive recently.  Prior history of pituitary adenoma s/p resection with MCA aneurysm.  MRI brain shows chronic changes at this time.  TSH normal.  B12 was normal. - PT/OT recommending home health -Neurology recommending outpatient follow-up with her primary neurology   Acute on chronic systolic CHF (congestive heart failure) (HCC) Patient reporting several week history of shortness of breath and orthopnea with evidence of pulmonary congestion seen on imaging.  Prior history of HFrEF with last EF in 2022 at 35-40%.  Did well after a session of dialysis, breathing is back to baseline.  If echocardiogram gets done prior to her discharge is okay otherwise no need to delay discharge   End-stage renal disease on hemodialysis [facility]) HD per  nephrology   Essential hypertension On Coreg and Norvasc.   COPD (chronic obstructive pulmonary disease) with emphysema (HCC) Not an active exacerbation.  As needed bronchodilators    Type 2 diabetes mellitus with hyperlipidemia (HCC) Resume home regimen   CAD (coronary artery disease) Currently chest pain-free.  Continue aspirin and statin    DVT prophylaxis: heparin injection 5,000 Units Start: 12/07/23 2200 Code Status: Full Family Communication:      Subjective: Doing better no complaints. Balance is better today otherwise feels like pebbles in her foot.  Just finished working with PT.  Has been having LE tingling for months now, and took robaxin last night which help her quite a bit.  She wishes to go home today  Examination:  General exam: Appears calm and comfortable  Respiratory system: Clear to auscultation. Respiratory effort normal. Cardiovascular system: S1 & S2 heard, RRR. No JVD, murmurs, rubs, gallops or clicks. No pedal edema. Gastrointestinal system: Abdomen is nondistended, soft and nontender. No organomegaly or masses felt. Normal bowel sounds heard. Central nervous system: Alert and oriented. No focal neurological deficits. Extremities: Symmetric 5 x 5 power. Skin: No rashes, lesions or ulcers Psychiatry: Judgement and insight appear normal. Mood & affect appropriate.

## 2023-12-08 NOTE — ED Notes (Signed)
Patient with PT.

## 2023-12-08 NOTE — Discharge Summary (Signed)
Physician Discharge Summary  Terry Arroyo ZOX:[id number] DOB: May 11, 1961 DOA: 12/07/2023  PCP: [name], MD  Admit date: 12/07/2023 Discharge date: 12/08/2023  Admitted From: Home Disposition: Home with home health  Recommendations for Outpatient Follow-up:  Follow up with PCP in 1-2 weeks Please obtain BMP/CBC in one week your next doctors visit.  Robaxin given for muscle relaxer as needed Resume outpatient hemodialysis   Discharge Condition: Stable CODE STATUS: Full code Diet recommendation: Cardiac  Brief/Interim Summary: Brief Narrative:  62 year old with history of ESRD on HD MWF, pituitary adenoma with visual deficits s/p resection, cerebral aneurysm, DM2, CAD status post CABG, postop DVT, CHF with reduced EF 35%, HTN, HLD comes to the hospital with shortness of breath and gait instability.  Patient is also experienced some shortness of breath especially orthopnea over the last few weeks.  CT of the head negative, chest x-ray showed slight volume overload with elevated BNP. After a session of dialysis she feels great this morning without any complaints.  Feeling back to her baseline and very steady.  No other complaints at this time.  With physical therapy she was able to ambulate without any issues. Medically stable for discharge.  Assessment & Plan:  Principal Problem:   Gait instability Active Problems:   Acute on chronic systolic CHF (congestive heart failure) (HCC)   End-stage renal disease on hemodialysis (HCC)   Essential hypertension   COPD (chronic obstructive pulmonary disease) with emphysema (HCC)   Type 2 diabetes mellitus with hyperlipidemia (HCC)   CAD (coronary artery disease)   Sinus bradycardia    * Gait instability, improved This appears to be progressive recently.  Prior history of pituitary adenoma s/p resection with MCA aneurysm.  MRI brain shows chronic changes at this time.  TSH normal.  B12 was normal. - PT/OT recommending home  health -Neurology recommending outpatient follow-up with her primary neurology   Acute on chronic systolic CHF (congestive heart failure) (HCC) Patient reporting several week history of shortness of breath and orthopnea with evidence of pulmonary congestion seen on imaging.  Prior history of HFrEF with last EF in 2022 at 35-40%.  Did well after a session of dialysis, breathing is back to baseline.  If echocardiogram gets done prior to her discharge is okay otherwise no need to delay discharge   End-stage renal disease on hemodialysis [facility]) HD per nephrology   Essential hypertension On Coreg and Norvasc.   COPD (chronic obstructive pulmonary disease) with emphysema (HCC) Not an active exacerbation.  As needed bronchodilators    Type 2 diabetes mellitus with hyperlipidemia (HCC) Resume home regimen   CAD (coronary artery disease) Currently chest pain-free.  Continue aspirin and statin    DVT prophylaxis: heparin injection 5,000 Units Start: 12/07/23 2200 Code Status: Full Family Communication:      Subjective: Doing better no complaints. Balance is better today otherwise feels like pebbles in her foot.  Just finished working with PT.  Has been having LE tingling for months now, and took robaxin last night which help her quite a bit.  She wishes to go home today  Examination:  General exam: Appears calm and comfortable  Respiratory system: Clear to auscultation. Respiratory effort normal. Cardiovascular system: S1 & S2 heard, RRR. No JVD, murmurs, rubs, gallops or clicks. No pedal edema. Gastrointestinal system: Abdomen is nondistended, soft and nontender. No organomegaly or masses felt. Normal bowel sounds heard. Central nervous system: Alert and oriented. No focal neurological deficits. Extremities: Symmetric 5 x 5 power. Skin: No  rashes, lesions or ulcers Psychiatry: Judgement and insight appear normal. Mood & affect appropriate.    Discharge Diagnoses:  Principal  Problem:   Gait instability Active Problems:   Acute on chronic systolic CHF (congestive heart failure) (HCC)   End-stage renal disease on hemodialysis (HCC)   Essential hypertension   COPD (chronic obstructive pulmonary disease) with emphysema (HCC)   Type 2 diabetes mellitus with hyperlipidemia (HCC)   CAD (coronary artery disease)   Sinus bradycardia   Ambulatory dysfunction       Discharge Exam: Vitals:   12/08/23 1215 12/08/23 1300  BP:  (!) 133/59  Pulse: (!) 47 (!) 50  Resp:  18  Temp:    SpO2: 100% 97%   Vitals:   12/08/23 1100 12/08/23 1200 12/08/23 1215 12/08/23 1300  BP: (!) 140/75 (!) 149/65  (!) 133/59  Pulse: (!) 51 69 (!) 47 (!) 50  Resp:    18  Temp:      TempSrc:      SpO2: 100%  100% 97%  Weight:      Height:        Discharge Instructions   Allergies as of 12/08/2023       Reactions   Hydralazine Hives, Shortness Of Breath, Swelling, Rash   Body aches   Kiwi Extract Hives, Swelling, Rash   Mouth swells    Lisinopril Swelling   Angioedema   Shellfish Allergy Anaphylaxis   Shrimp/lobster  Betadine leaves welts on skin    Betadine [povidone Iodine] Hives, Rash   LEAVES WELTS ON SKIN   Septra [sulfamethoxazole-trimethoprim]    Metformin Diarrhea, Other (See Comments)   GI Intolerance   Tape Itching   Use paper tape whenever possible        Medication List     TAKE these medications    acetaminophen 500 MG tablet Commonly known as: TYLENOL Take 1,000 mg by mouth every 6 (six) hours as needed for mild pain or moderate pain.   albuterol 108 (90 Base) MCG/ACT inhaler Commonly known as: VENTOLIN HFA Inhale 2 puffs into the lungs every 6 (six) hours as needed for wheezing or shortness of breath.   amLODipine 10 MG tablet Commonly known as: NORVASC Take 1 tablet (10 mg total) by mouth daily.   aspirin EC 81 MG tablet Take 1 tablet (81 mg total) by mouth daily.   atorvastatin 80 MG tablet Commonly known as: LIPITOR Take 1  tablet (80 mg total) by mouth daily at 6 PM.   calcium acetate 667 MG capsule Commonly known as: PHOSLO Take 1,334 mg by mouth 3 (three) times daily with meals.   carvedilol 25 MG tablet Commonly known as: COREG Take 25 mg by mouth 2 (two) times daily.   cyanocobalamin 1000 MCG tablet Commonly known as: VITAMIN B12 Take 1,000 mcg by mouth daily.   famotidine 40 MG tablet Commonly known as: PEPCID Take 1 tablet by mouth as needed for heartburn.   furosemide 80 MG tablet Commonly known as: LASIX Take 80 mg by mouth 4 (four) times a week. Tues, Thurs, Sat, and Sun (non-dialysis days)   gabapentin 300 MG capsule Commonly known as: NEURONTIN Take 300 mg by mouth daily.   lidocaine-prilocaine cream Commonly known as: EMLA Apply 1 Application topically See admin instructions. Used on Mon Wed and Fri   methocarbamol 500 MG tablet Commonly known as: ROBAXIN Take 1 tablet (500 mg total) by mouth 2 (two) times daily as needed for muscle spasms.   mirtazapine 30  MG tablet Commonly known as: REMERON Take 30 mg by mouth at bedtime.   NovoLOG Mix 70/30 FlexPen (70-30) 100 UNIT/ML FlexPen Generic drug: insulin aspart protamine - aspart Inject 15 Units into the skin 3 (three) times daily with meals.   Rena-Vite Rx 1 MG Tabs Take 1 tablet by mouth daily.   topiramate 50 MG tablet Commonly known as: TOPAMAX Take 50 mg by mouth daily.   traZODone 50 MG tablet Commonly known as: DESYREL Take 0.5 tablets (25 mg total) by mouth at bedtime. What changed: how much to take   True Metrix Blood Glucose Test test strip Generic drug: glucose blood   True Metrix Meter w/Device Kit        Follow-up Information     [name], MD Follow up in 1 week(s).   Specialty: Internal Medicine Contact information: [address] [facility] [place] [address] [phone]                Allergies  Allergen Reactions   Hydralazine Hives, Shortness  Of Breath, Swelling and Rash    Body aches    Kiwi Extract Hives, Swelling and Rash    Mouth swells    Lisinopril Swelling    Angioedema   Shellfish Allergy Anaphylaxis    Shrimp/lobster  Betadine leaves welts on skin     Betadine [Povidone Iodine] Hives and Rash    LEAVES WELTS ON SKIN   Septra [Sulfamethoxazole-Trimethoprim]    Metformin Diarrhea and Other (See Comments)    GI Intolerance   Tape Itching    Use paper tape whenever possible    You were cared for by a hospitalist during your hospital stay. If you have any questions about your discharge medications or the care you received while you were in the hospital after you are discharged, you can call the unit and asked to speak with the hospitalist on call if the hospitalist that took care of you is not available. Once you are discharged, your primary care physician will handle any further medical issues. Please note that no refills for any discharge medications will be authorized once you are discharged, as it is imperative that you return to your primary care physician (or establish a relationship with a primary care physician if you do not have one) for your aftercare needs so that they can reassess your need for medications and monitor your lab values.  You were cared for by a hospitalist during your hospital stay. If you have any questions about your discharge medications or the care you received while you were in the hospital after you are discharged, you can call the unit and asked to speak with the hospitalist on call if the hospitalist that took care of you is not available. Once you are discharged, your primary care physician will handle any further medical issues. Please note that NO REFILLS for any discharge medications will be authorized once you are discharged, as it is imperative that you return to your primary care physician (or establish a relationship with a primary care physician if you do not have one) for your  aftercare needs so that they can reassess your need for medications and monitor your lab values.  Please request your Prim.MD to go over all Hospital Tests and Procedure/Radiological results at the follow up, please get all Hospital records sent to your Prim MD by signing hospital release before you go home.  Get CBC, CMP, 2 view Chest X ray checked  by Primary MD during your next visit or SNF MD in 5-7 days ( we routinely change or add medications that can affect your baseline labs and fluid status, therefore we recommend that you get the mentioned basic workup next visit with your PCP, your PCP may decide not to get them or add new tests based on their clinical decision)  On your next visit with your primary care physician please Get Medicines reviewed and adjusted.  If you experience worsening of your admission symptoms, develop shortness of breath, life threatening emergency, suicidal or homicidal thoughts you must seek medical attention immediately by calling 911 or calling your MD immediately  if symptoms less severe.  You Must read complete instructions/literature along with all the possible adverse reactions/side effects for all the Medicines you take and that have been prescribed to you. Take any new Medicines after you have completely understood and accpet all the possible adverse reactions/side effects.   Do not drive, operate heavy machinery, perform activities at heights, swimming or participation in water activities or provide baby sitting services if your were admitted for syncope or siezures until you have seen by Primary MD or a Neurologist and advised to do so again.  Do not drive when taking Pain medications.   Procedures/Studies: MR BRAIN WO CONTRAST Result Date: 12/07/2023 CLINICAL DATA:  Mental status change, unknown cause. Gait instability. History of pituitary adenoma. EXAM: MRI HEAD WITHOUT CONTRAST TECHNIQUE: Multiplanar, multiecho pulse sequences of the brain and  surrounding structures were obtained without intravenous contrast. COMPARISON:  Head CT 12/07/2023 FINDINGS: Brain: There is no evidence of an acute infarct, midline shift, or extra-axial fluid collection. A few scattered chronic cerebral microhemorrhages are noted. Small T2 hyperintensities in the cerebral white matter nonspecific but compatible with mild chronic small vessel ischemic disease. Mild, smooth diffuse dural thickening is again noted. Sequelae of pituitary tumor resection are again noted without evidence of a gross sellar mass on this routine MRI which does not include dedicated pituitary imaging or IV contrast. Vascular: Abnormal appearance of the dural venous sinuses with remote history of extensive chronic dural venous sinus thrombosis. Major intracranial arterial flow voids are preserved. Skull and upper cervical spine: No suspicious marrow lesion. Sinuses/Orbits: Right cataract extraction and proptosis. Postsurgical changes in the nasal cavity and sphenoid sinus region. Mild mucosal thickening in the paranasal sinuses. Small right mastoid effusion. Other: None. IMPRESSION: 1. No acute intracranial abnormality. 2. Chronic findings as above including mild chronic small vessel ischemic disease. Electronically Signed   By: Sebastian Ache M.D.   On: 12/07/2023 14:42   CT Head Wo Contrast Result Date: 12/07/2023 CLINICAL DATA:  Neuro deficit, acute, stroke suspected. EXAM: CT HEAD WITHOUT CONTRAST TECHNIQUE: Contiguous axial images were obtained from the base of the skull through the vertex without intravenous contrast. RADIATION DOSE REDUCTION: This exam was performed according to the departmental dose-optimization program which includes automated exposure control, adjustment of the mA and/or kV according to patient size and/or use of iterative reconstruction technique. COMPARISON:  Head CT 09/29/2022 FINDINGS: Brain: There is no evidence of an acute infarct, intracranial hemorrhage, mass, midline  shift, or extra-axial fluid collection. The ventricles are normal in size. Prominent diffuse dural hyperdensity/calcification along the falx and tentorium is similar to the prior CT. Postsurgical changes are again noted in the sellar region. Vascular: History of widespread chronic dural venous sinus thrombosis. Calcified atherosclerosis at the skull base. Skull: No acute fracture or suspicious osseous lesion. Sinuses/Orbits: Postsurgical changes in the posterior nasal cavity  and sphenoid sinuses. Mild mucosal thickening in the sphenoid and posterior left ethmoid sinuses. Small chronic right mastoid effusion. Right cataract extraction. Other: None. IMPRESSION: No evidence of acute intracranial abnormality. Electronically Signed   By: Sebastian Ache M.D.   On: 12/07/2023 12:27   DG Chest 2 View Result Date: 12/07/2023 CLINICAL DATA:  Shortness of breath. EXAM: CHEST - 2 VIEW COMPARISON:  May 02, 2023. FINDINGS: Mild cardiomegaly is noted with mild central pulmonary vascular congestion. Sternotomy wires are noted. Mild bilateral pulmonary edema may be present. Bony thorax is unremarkable. IMPRESSION: Mild cardiomegaly with mild central pulmonary vascular congestion and possible mild bilateral pulmonary edema. Electronically Signed   By: Lupita Raider M.D.   On: 12/07/2023 08:49     The results of significant diagnostics from this hospitalization (including imaging, microbiology, ancillary and laboratory) are listed below for reference.     Microbiology: No results found for this or any previous visit (from the past 240 hours).   Labs: BNP (last 3 results) Recent Labs    12/07/23 0826  BNP 1,576.0*   Basic Metabolic Panel: Recent Labs  Lab 12/07/23 0826 12/08/23 0620  NA 137 135  K 5.0 4.1  CL 100 98  CO2 25 25  GLUCOSE 149* 256*  BUN 44* 36*  CREATININE 7.82* 6.10*  CALCIUM 9.0 8.5*   Liver Function Tests: No results for input(s): "AST", "ALT", "ALKPHOS", "BILITOT", "PROT", "ALBUMIN"  in the last 168 hours. No results for input(s): "LIPASE", "AMYLASE" in the last 168 hours. No results for input(s): "AMMONIA" in the last 168 hours. CBC: Recent Labs  Lab 12/07/23 0826 12/08/23 0620  WBC 7.9 7.7  HGB 10.5* 10.4*  HCT 33.6* 31.8*  MCV 96.6 93.3  PLT 153 167   Cardiac Enzymes: Recent Labs  Lab 12/08/23 0620  CKTOTAL 63   BNP: Invalid input(s): "POCBNP" CBG: Recent Labs  Lab 12/07/23 2028 12/08/23 0736 12/08/23 1203  GLUCAP 233* 225* 210*   D-Dimer No results for input(s): "DDIMER" in the last 72 hours. Hgb A1c No results for input(s): "HGBA1C" in the last 72 hours. Lipid Profile No results for input(s): "CHOL", "HDL", "LDLCALC", "TRIG", "CHOLHDL", "LDLDIRECT" in the last 72 hours. Thyroid function studies Recent Labs    12/07/23 0826  TSH 2.449   Anemia work up Recent Labs    12/08/23 0620  VITAMINB12 174*   Urinalysis    Component Value Date/Time   COLORURINE YELLOW (A) 11/04/2021 0035   APPEARANCEUR HAZY (A) 11/04/2021 0035   LABSPEC 1.016 11/04/2021 0035   PHURINE 7.0 11/04/2021 0035   GLUCOSEU 150 (A) 11/04/2021 0035   HGBUR SMALL (A) 11/04/2021 0035   BILIRUBINUR NEGATIVE 11/04/2021 0035   KETONESUR NEGATIVE 11/04/2021 0035   PROTEINUR >=300 (A) 11/04/2021 0035   NITRITE NEGATIVE 11/04/2021 0035   LEUKOCYTESUR SMALL (A) 11/04/2021 0035   Sepsis Labs Recent Labs  Lab 12/07/23 0826 12/08/23 0620  WBC 7.9 7.7   Microbiology No results found for this or any previous visit (from the past 240 hours).   Time coordinating discharge:  I have spent 35 minutes face to face with the patient and on the ward discussing the patients care, assessment, plan and disposition with other care givers. >50% of the time was devoted counseling the patient about the risks and benefits of treatment/Discharge disposition and coordinating care.   SIGNED:   Miguel Rota, MD  [facility] 12/08/2023, 1:17 PM   If 7PM-7AM, please contact  night-coverage

## 2023-12-13 LAB — VITAMIN B1: Vitamin B1 (Thiamine): 128 nmol/L (ref 66.5–200.0)

## 2023-12-22 ENCOUNTER — Ambulatory Visit: Payer: Self-pay | Admitting: *Deleted

## 2023-12-22 NOTE — Patient Outreach (Signed)
  Care Coordination   Follow Up Visit Note   12/22/2023 Name: Terry Arroyo MRN: [id number] DOB: 03-Oct-1961  Terry Arroyo is a 63 y.o. year old female who sees Terry Arroyo ORN, MD for primary care. I spoke with  Terry Arroyo by phone today.  What matters to the patients health and wellness today?  Patient state she has been compliant with HD, but was seen in the ED on 12/18 for shortness of breath.  State her HR has also been decreasing during during sessions, down to 47, and was recommended to follow up with cardiology.      Goals Addressed             This Visit's Progress    Effective management of chronic medical conditions   On track    Interventions Today    Flowsheet Row Most Recent Value  Chronic Disease   Chronic disease during today's visit Chronic Kidney Disease/End Stage Renal Disease (ESRD), Hypertension (HTN), Diabetes, Congestive Heart Failure (CHF), Chronic Obstructive Pulmonary Disease (COPD)  General Interventions   General Interventions Discussed/Reviewed Doctor Visits, Communication with, General Interventions Reviewed  [Report compliance with HD M/W/F]  Doctor Visits Discussed/Reviewed Doctor Visits Reviewed, Specialist  PCP/Specialist Visits Compliance with follow-up visit  Communication with PCP/Specialists  [Call placed to HF clinic to reschedule missed appt (1/21 @ 10am).  Call placed to PCP office to have patient set up for clinic's CCM program]  Exercise Interventions   Exercise Discussed/Reviewed Weight Managment  Weight Management Weight maintenance  [Reminded to monitor weights even on non-dialysis days]  Education Interventions   Education Provided Provided Education  Provided Verbal Education On Blood Sugar Monitoring, Medication, When to see the doctor, Labs, Nutrition, Community Resources  [Reminded to monitor blood sugar, BP daily.  Reminded to follow up with SW regarding increased assistance in the home]  Labs Reviewed Hgb A1c               SDOH assessments and interventions completed:  No     Care Coordination Interventions:  Yes, provided   Follow up plan: Follow up call scheduled for 1/8    Encounter Outcome:  Patient Visit Completed   Terry Ku, RN, MSN, CCM [name]  [facility], [facility] RN Care Coordinator Direct Dial: [phone] / Main [phone] Fax [phone] Email: [email or web address]: [email or web address]

## 2023-12-28 ENCOUNTER — Ambulatory Visit: Payer: Self-pay | Admitting: *Deleted

## 2023-12-28 NOTE — Patient Outreach (Signed)
  Care Coordination   Follow Up Visit Note   12/28/2023 Name: Terry Arroyo MRN: [id number] DOB: 19-Aug-1961  Terry Arroyo is a 63 y.o. year old female who sees Lenon Layman ORN, MD for primary care. I spoke with  Terry Arroyo by phone today.  What matters to the patients health and wellness today?  Patient currently at dialysis, verbalized understanding of HF appointment and that CCM team at [facility] will take over care management services.     Goals Addressed             This Visit's Progress    Effective management of chronic medical conditions   On track    Interventions Today    Flowsheet Row Most Recent Value  Chronic Disease   Chronic disease during today's visit Chronic Kidney Disease/End Stage Renal Disease (ESRD), Hypertension (HTN), Diabetes, Congestive Heart Failure (CHF), Chronic Obstructive Pulmonary Disease (COPD)  General Interventions   General Interventions Discussed/Reviewed Doctor Visits, Communication with, General Interventions Reviewed  [Report compliance with HD M/W/F]  Doctor Visits Discussed/Reviewed Doctor Visits Reviewed, Specialist  PCP/Specialist Visits Compliance with follow-up visit  Communication with PCP/Specialists  [Call placed to HF clinic to reschedule missed appt (1/21 @ 10am).  Call placed to PCP office to have patient set up for clinic's CCM program]  Exercise Interventions   Exercise Discussed/Reviewed Weight Managment  Weight Management Weight maintenance  [Reminded to monitor weights even on non-dialysis days]  Education Interventions   Education Provided Provided Education  Provided Verbal Education On Blood Sugar Monitoring, Medication, When to see the doctor, Labs, Nutrition, Community Resources  [Reminded to monitor blood sugar, BP daily.  Reminded to follow up with SW regarding increased assistance in the home]  Labs Reviewed Hgb A1c              SDOH assessments and interventions completed:  No     Care  Coordination Interventions:  Yes, provided   Follow up plan: No further intervention required.   Encounter Outcome:  Patient Visit Completed   Odella Ku, RN, MSN, CCM [place]  [facility], [facility] RN Care Coordinator Direct Dial: [phone] / Main [phone] Fax [phone] Email: [email or web address]: [email or web address]

## 2024-01-09 NOTE — Progress Notes (Deleted)
Advanced Heart Failure Clinic Note    PCP: [name], MD (last seen 05/24) Cardiologist: Dorothyann Peng, MD (last seen 03/23)  Chief Complaint:   HPI:  Ms Booten is a 63 y/o female with a history of DM, hyperlipidemia, HTN, brain tumor, left eye blindness, previous tobacco use and chronic heart failure.   Admitted 12/07/23 due to dizziness, instability and shortness of breath. She felt like she was leaning to the left side. Did not go to dialysis today. BNP elevated. Head CT negative. Dialysis session done and symptoms much improved.   Echo 02/27/20: EF of 30-35% along with mild MR. Echo 06/13/21: EF of 35-40% along with mild MR and mild/moderate TR.  Echo 12/08/23: EF 45-50% with mild LVH, Grade II DD, moderately elevated PA pressure of 51.8 mm Hg, moderate LAE, mild MR, mild/ moderate TR  She presents today for a HF follow-up visit with a chief complaint of   Getting dialysis on M, W, F.     Past Medical History:  Diagnosis Date   Anemia    Angina at rest [facility])    Angioedema 03/20/2021   Lisinopril   Anterior cerebral aneurysm    approximately 5 mm; left MCA    Anxiety    Aortic atherosclerosis (HCC)    Asthma    Blind left eye    CHF (congestive heart failure) (HCC)    COPD (chronic obstructive pulmonary disease) (HCC)    Coronary artery disease    Depression    DOE (dyspnea on exertion)    ESRD (end stage renal disease) (HCC)    Gait instability    Genital herpes    GERD (gastroesophageal reflux disease)    HFrEF (heart failure with reduced ejection fraction) (HCC)    History of 2019 novel coronavirus disease (COVID-19) 03/2019   [facility] [place]   History of DVT (deep vein thrombosis)    History of DVT (deep vein thrombosis)    Hx of CABG 2018   x 4; performed in [place]   Hyperlipidemia    Hypertension    MI (myocardial infarction) (HCC) 2018   no stents   Pituitary adenoma (HCC)    PVD (posterior vitreous detachment),  right eye    Secondary hyperparathyroidism (HCC)    Sleep apnea    non-compliant with nocturnal PAP therapy   Stroke [facility]) 2014   stated affected left side.   T2DM (type 2 diabetes mellitus) (HCC)     Current Outpatient Medications  Medication Sig Dispense Refill   acetaminophen (TYLENOL) 500 MG tablet Take 1,000 mg by mouth every 6 (six) hours as needed for mild pain or moderate pain.     albuterol (VENTOLIN HFA) 108 (90 Base) MCG/ACT inhaler Inhale 2 puffs into the lungs every 6 (six) hours as needed for wheezing or shortness of breath.     amLODipine (NORVASC) 10 MG tablet Take 1 tablet (10 mg total) by mouth daily. 30 tablet 0   aspirin 81 MG EC tablet Take 1 tablet (81 mg total) by mouth daily. 30 tablet 0   atorvastatin (LIPITOR) 80 MG tablet Take 1 tablet (80 mg total) by mouth daily at 6 PM. 30 tablet 0   B Complex-C-Folic Acid (RENA-VITE RX) 1 MG TABS Take 1 tablet by mouth daily.     Blood Glucose Monitoring Suppl (TRUE METRIX METER) w/Device KIT      calcium acetate (PHOSLO) 667 MG capsule Take 1,334 mg by mouth 3 (three) times daily with  meals.     carvedilol (COREG) 25 MG tablet Take 25 mg by mouth 2 (two) times daily.     famotidine (PEPCID) 40 MG tablet Take 1 tablet by mouth as needed for heartburn.     furosemide (LASIX) 80 MG tablet Take 80 mg by mouth 4 (four) times a week. Tues, Thurs, Sat, and Sun (non-dialysis days)     gabapentin (NEURONTIN) 300 MG capsule Take 300 mg by mouth daily.     lidocaine-prilocaine (EMLA) cream Apply 1 Application topically See admin instructions. Used on Mon Wed and Fri     methocarbamol (ROBAXIN) 500 MG tablet Take 1 tablet (500 mg total) by mouth 2 (two) times daily as needed for muscle spasms. 20 tablet 0   mirtazapine (REMERON) 30 MG tablet Take 30 mg by mouth at bedtime.     NOVOLOG MIX 70/30 FLEXPEN (70-30) 100 UNIT/ML FlexPen Inject 15 Units into the skin 3 (three) times daily with meals.     topiramate (TOPAMAX) 50 MG tablet Take  50 mg by mouth daily.     traZODone (DESYREL) 50 MG tablet Take 0.5 tablets (25 mg total) by mouth at bedtime. (Patient taking differently: Take 100 mg by mouth at bedtime.)     TRUE METRIX BLOOD GLUCOSE TEST test strip      vitamin B-12 (CYANOCOBALAMIN) 1000 MCG tablet Take 1,000 mcg by mouth daily.     No current facility-administered medications for this visit.    Allergies  Allergen Reactions   Hydralazine Hives, Shortness Of Breath, Swelling and Rash    Body aches    Kiwi Extract Hives, Swelling and Rash    Mouth swells    Lisinopril Swelling    Angioedema   Shellfish Allergy Anaphylaxis    Shrimp/lobster  Betadine leaves welts on skin     Betadine [Povidone Iodine] Hives and Rash    LEAVES WELTS ON SKIN   Septra [Sulfamethoxazole-Trimethoprim]    Metformin Diarrhea and Other (See Comments)    GI Intolerance   Tape Itching    Use paper tape whenever possible      Social History   Socioeconomic History   Marital status: Single    Spouse name: Not on file   Number of children: Not on file   Years of education: Not on file   Highest education level: Not on file  Occupational History    Comment: disabled  Tobacco Use   Smoking status: Former    Current packs/day: 0.00    Types: Cigarettes    Quit date: 2018    Years since quitting: 7.0   Smokeless tobacco: Never  Vaping Use   Vaping status: Never Used  Substance and Sexual Activity   Alcohol use: Not Currently   Drug use: Not Currently   Sexual activity: Not Currently  Other Topics Concern   Not on file  Social History Narrative   Patient lives alone.  Feels safe in her home.   Social Drivers of Corporate investment banker Strain: Low Risk  (10/13/2023)   Received from [facility]   Overall Financial Resource Strain (CARDIA)    Difficulty of Paying Living Expenses: Not very hard  Food Insecurity: No Food Insecurity (10/13/2023)   Received from [facility]   Hunger  Vital Sign    Worried About Running Out of Food in the Last Year: Never true    Ran Out of Food in the Last Year: Never true  Transportation Needs: No Transportation  Needs (10/13/2023)   Received from [facility] - Transportation    In the past 12 months, has lack of transportation kept you from medical appointments or from getting medications?: No    Lack of Transportation (Non-Medical): No  Physical Activity: Not on file  Stress: Not on file  Social Connections: Not on file  Intimate Partner Violence: Not At Risk (10/05/2022)   Humiliation, Afraid, Rape, and Kick questionnaire    Fear of Current or Ex-Partner: No    Emotionally Abused: No    Physically Abused: No    Sexually Abused: No      Family History  Problem Relation Age of Onset   CAD Mother    CAD Brother    Breast cancer Sister 53       PHYSICAL EXAM: General:  Well appearing. No respiratory difficulty HEENT: normal Neck: supple. no JVD. Carotids 2+ bilat; no bruits. No lymphadenopathy or thyromegaly appreciated. Cor: PMI nondisplaced. Regular rate & rhythm. No rubs, gallops or murmurs. Lungs: clear Abdomen: soft, nontender, nondistended. No hepatosplenomegaly. No bruits or masses. Good bowel sounds. Extremities: no cyanosis, clubbing, rash, edema Neuro: alert & oriented x 3, cranial nerves grossly intact. moves all 4 extremities w/o difficulty. Affect pleasant.  ECG:   ASSESSMENT & PLAN:  1: Chronic heart failure with reduced ejection fraction- - suspect due to - NYHA class III - euvolemic today - weighing at dialysis as she says that her weight fluctuates in between sessions - weight 163.4 pounds from last visit here 10 months ago - Echo 02/27/20: EF of 30-35% along with mild MR. - Echo 06/13/21: EF of 35-40% along with mild MR and mild/moderate TR.  - Echo 12/08/23: EF 45-50% with mild LVH, Grade II DD, moderately elevated PA pressure of 51.8 mm Hg, moderate LAE, mild MR,  mild/ moderate TR - continue carvedilol 25mg  BID - continue furosemide 80mg  on non-dialysis days (T, TH, Sa, Su) - renal function limits other GDMT - not adding salt  - saw cardiology Juliann Pares) 03/23 - BNP 12/07/23 was 1576.0  2: HTN- - BP  - amlodipine 10mg  daily - saw PCP Dareen Piano) 05/24  3: DM- - saw endocrinology Rosaura Carpenter) 07/21 - A1c 05/03/23 was 8.1%  4: ESRD- - BMP 12/08/23 reviewed and showed sodium 135, potassium 4.1, creatinine 6.10 and GFR 7 - saw nephrology (Kolluru) 11/22 - saw vascular Manson Passey) 02/24 - dialysis Horald Pollen, FNP 01/09/24

## 2024-01-10 ENCOUNTER — Encounter: Payer: 59 | Admitting: Family

## 2024-01-12 NOTE — Progress Notes (Deleted)
Advanced Heart Failure Clinic Note    PCP: [name], MD (last seen 05/24) Cardiologist: Dorothyann Peng, MD (last seen 03/23)  Chief Complaint:   HPI:  Terry Arroyo is a 63 y/o female with a history of DM, hyperlipidemia, HTN, brain tumor, left eye blindness, previous tobacco use and chronic heart failure.   Admitted 12/07/23 due to dizziness, instability and shortness of breath. She felt like she was leaning to the left side. Did not go to dialysis today. BNP elevated. Head CT negative. Dialysis session done and symptoms much improved.   Echo 02/27/20: EF of 30-35% along with mild MR. Echo 06/13/21: EF of 35-40% along with mild MR and mild/moderate TR.  Echo 12/08/23: EF 45-50% with mild LVH, Grade II DD, moderately elevated PA pressure of 51.8 mm Hg, moderate LAE, mild MR, mild/ moderate TR  She presents today for a HF follow-up visit with a chief complaint of   Getting dialysis on M, W, F.     Past Medical History:  Diagnosis Date   Anemia    Angina at rest [facility])    Angioedema 03/20/2021   Lisinopril   Anterior cerebral aneurysm    approximately 5 mm; left MCA    Anxiety    Aortic atherosclerosis (HCC)    Asthma    Blind left eye    CHF (congestive heart failure) (HCC)    COPD (chronic obstructive pulmonary disease) (HCC)    Coronary artery disease    Depression    DOE (dyspnea on exertion)    ESRD (end stage renal disease) (HCC)    Gait instability    Genital herpes    GERD (gastroesophageal reflux disease)    HFrEF (heart failure with reduced ejection fraction) (HCC)    History of 2019 novel coronavirus disease (COVID-19) 03/2019   [facility] [place]   History of DVT (deep vein thrombosis)    History of DVT (deep vein thrombosis)    Hx of CABG 2018   x 4; performed in [place]   Hyperlipidemia    Hypertension    MI (myocardial infarction) (HCC) 2018   no stents   Pituitary adenoma (HCC)    PVD (posterior vitreous detachment),  right eye    Secondary hyperparathyroidism (HCC)    Sleep apnea    non-compliant with nocturnal PAP therapy   Stroke [facility]) 2014   stated affected left side.   T2DM (type 2 diabetes mellitus) (HCC)     Current Outpatient Medications  Medication Sig Dispense Refill   acetaminophen (TYLENOL) 500 MG tablet Take 1,000 mg by mouth every 6 (six) hours as needed for mild pain or moderate pain.     albuterol (VENTOLIN HFA) 108 (90 Base) MCG/ACT inhaler Inhale 2 puffs into the lungs every 6 (six) hours as needed for wheezing or shortness of breath.     amLODipine (NORVASC) 10 MG tablet Take 1 tablet (10 mg total) by mouth daily. 30 tablet 0   aspirin 81 MG EC tablet Take 1 tablet (81 mg total) by mouth daily. 30 tablet 0   atorvastatin (LIPITOR) 80 MG tablet Take 1 tablet (80 mg total) by mouth daily at 6 PM. 30 tablet 0   B Complex-C-Folic Acid (RENA-VITE RX) 1 MG TABS Take 1 tablet by mouth daily.     Blood Glucose Monitoring Suppl (TRUE METRIX METER) w/Device KIT      calcium acetate (PHOSLO) 667 MG capsule Take 1,334 mg by mouth 3 (three) times daily with  meals.     carvedilol (COREG) 25 MG tablet Take 25 mg by mouth 2 (two) times daily.     famotidine (PEPCID) 40 MG tablet Take 1 tablet by mouth as needed for heartburn.     furosemide (LASIX) 80 MG tablet Take 80 mg by mouth 4 (four) times a week. Tues, Thurs, Sat, and Sun (non-dialysis days)     gabapentin (NEURONTIN) 300 MG capsule Take 300 mg by mouth daily.     lidocaine-prilocaine (EMLA) cream Apply 1 Application topically See admin instructions. Used on Mon Wed and Fri     methocarbamol (ROBAXIN) 500 MG tablet Take 1 tablet (500 mg total) by mouth 2 (two) times daily as needed for muscle spasms. 20 tablet 0   mirtazapine (REMERON) 30 MG tablet Take 30 mg by mouth at bedtime.     NOVOLOG MIX 70/30 FLEXPEN (70-30) 100 UNIT/ML FlexPen Inject 15 Units into the skin 3 (three) times daily with meals.     topiramate (TOPAMAX) 50 MG tablet Take  50 mg by mouth daily.     traZODone (DESYREL) 50 MG tablet Take 0.5 tablets (25 mg total) by mouth at bedtime. (Patient taking differently: Take 100 mg by mouth at bedtime.)     TRUE METRIX BLOOD GLUCOSE TEST test strip      vitamin B-12 (CYANOCOBALAMIN) 1000 MCG tablet Take 1,000 mcg by mouth daily.     No current facility-administered medications for this visit.    Allergies  Allergen Reactions   Hydralazine Hives, Shortness Of Breath, Swelling and Rash    Body aches    Kiwi Extract Hives, Swelling and Rash    Mouth swells    Lisinopril Swelling    Angioedema   Shellfish Allergy Anaphylaxis    Shrimp/lobster  Betadine leaves welts on skin     Betadine [Povidone Iodine] Hives and Rash    LEAVES WELTS ON SKIN   Septra [Sulfamethoxazole-Trimethoprim]    Metformin Diarrhea and Other (See Comments)    GI Intolerance   Tape Itching    Use paper tape whenever possible      Social History   Socioeconomic History   Marital status: Single    Spouse name: Not on file   Number of children: Not on file   Years of education: Not on file   Highest education level: Not on file  Occupational History    Comment: disabled  Tobacco Use   Smoking status: Former    Current packs/day: 0.00    Types: Cigarettes    Quit date: 2018    Years since quitting: 7.0   Smokeless tobacco: Never  Vaping Use   Vaping status: Never Used  Substance and Sexual Activity   Alcohol use: Not Currently   Drug use: Not Currently   Sexual activity: Not Currently  Other Topics Concern   Not on file  Social History Narrative   Patient lives alone.  Feels safe in her home.   Social Drivers of Corporate investment banker Strain: Low Risk  (10/13/2023)   Received from [facility]   Overall Financial Resource Strain (CARDIA)    Difficulty of Paying Living Expenses: Not very hard  Food Insecurity: No Food Insecurity (10/13/2023)   Received from [facility]   Hunger  Vital Sign    Worried About Running Out of Food in the Last Year: Never true    Ran Out of Food in the Last Year: Never true  Transportation Needs: No Transportation  Needs (10/13/2023)   Received from [facility] - Transportation    In the past 12 months, has lack of transportation kept you from medical appointments or from getting medications?: No    Lack of Transportation (Non-Medical): No  Physical Activity: Not on file  Stress: Not on file  Social Connections: Not on file  Intimate Partner Violence: Not At Risk (10/05/2022)   Humiliation, Afraid, Rape, and Kick questionnaire    Fear of Current or Ex-Partner: No    Emotionally Abused: No    Physically Abused: No    Sexually Abused: No      Family History  Problem Relation Age of Onset   CAD Mother    CAD Brother    Breast cancer Sister 68       PHYSICAL EXAM: General:  Well appearing. No respiratory difficulty HEENT: normal Neck: supple. no JVD. Carotids 2+ bilat; no bruits. No lymphadenopathy or thyromegaly appreciated. Cor: PMI nondisplaced. Regular rate & rhythm. No rubs, gallops or murmurs. Lungs: clear Abdomen: soft, nontender, nondistended. No hepatosplenomegaly. No bruits or masses. Good bowel sounds. Extremities: no cyanosis, clubbing, rash, edema Neuro: alert & oriented x 3, cranial nerves grossly intact. moves all 4 extremities w/o difficulty. Affect pleasant.  ECG:   ASSESSMENT & PLAN:  1: Chronic heart failure with reduced ejection fraction- - suspect due to - NYHA class III - euvolemic today - weighing at dialysis as she says that her weight fluctuates in between sessions - weight 163.4 pounds from last visit here 10 months ago - Echo 02/27/20: EF of 30-35% along with mild MR. - Echo 06/13/21: EF of 35-40% along with mild MR and mild/moderate TR.  - Echo 12/08/23: EF 45-50% with mild LVH, Grade II DD, moderately elevated PA pressure of 51.8 mm Hg, moderate LAE, mild MR,  mild/ moderate TR - continue carvedilol 25mg  BID - continue furosemide 80mg  on non-dialysis days (T, TH, Sa, Su) - renal function limits other GDMT - not adding salt  - saw cardiology Terry Arroyo) 03/23 - BNP 12/07/23 was 1576.0  2: HTN- - BP  - amlodipine 10mg  daily - saw PCP Terry Arroyo) 05/24  3: DM- - saw endocrinology Terry Arroyo) 07/21 - A1c 05/03/23 was 8.1%  4: ESRD- - BMP 12/08/23 reviewed and showed sodium 135, potassium 4.1, creatinine 6.10 and GFR 7 - saw nephrology (Kolluru) 11/22 - saw vascular Terry Arroyo) 02/24 - dialysis Terry Pollen, Terry Arroyo 01/12/24

## 2024-01-17 ENCOUNTER — Encounter: Payer: Self-pay | Admitting: Family

## 2024-01-17 ENCOUNTER — Encounter: Payer: 59 | Admitting: Family

## 2024-01-17 ENCOUNTER — Ambulatory Visit: Payer: 59 | Attending: Family | Admitting: Family

## 2024-01-17 VITALS — BP 141/68 | HR 53 | Wt 170.0 lb

## 2024-01-17 DIAGNOSIS — N186 End stage renal disease: Secondary | ICD-10-CM | POA: Diagnosis not present

## 2024-01-17 DIAGNOSIS — I428 Other cardiomyopathies: Secondary | ICD-10-CM | POA: Insufficient documentation

## 2024-01-17 DIAGNOSIS — H5462 Unqualified visual loss, left eye, normal vision right eye: Secondary | ICD-10-CM | POA: Insufficient documentation

## 2024-01-17 DIAGNOSIS — I132 Hypertensive heart and chronic kidney disease with heart failure and with stage 5 chronic kidney disease, or end stage renal disease: Secondary | ICD-10-CM | POA: Insufficient documentation

## 2024-01-17 DIAGNOSIS — R0602 Shortness of breath: Secondary | ICD-10-CM | POA: Diagnosis present

## 2024-01-17 DIAGNOSIS — I5022 Chronic systolic (congestive) heart failure: Secondary | ICD-10-CM | POA: Insufficient documentation

## 2024-01-17 DIAGNOSIS — Z87891 Personal history of nicotine dependence: Secondary | ICD-10-CM | POA: Diagnosis not present

## 2024-01-17 DIAGNOSIS — Z992 Dependence on renal dialysis: Secondary | ICD-10-CM | POA: Insufficient documentation

## 2024-01-17 DIAGNOSIS — E785 Hyperlipidemia, unspecified: Secondary | ICD-10-CM | POA: Diagnosis not present

## 2024-01-17 DIAGNOSIS — Z79899 Other long term (current) drug therapy: Secondary | ICD-10-CM | POA: Diagnosis not present

## 2024-01-17 DIAGNOSIS — E1140 Type 2 diabetes mellitus with diabetic neuropathy, unspecified: Secondary | ICD-10-CM | POA: Diagnosis not present

## 2024-01-17 DIAGNOSIS — I10 Essential (primary) hypertension: Secondary | ICD-10-CM | POA: Diagnosis not present

## 2024-01-17 DIAGNOSIS — D496 Neoplasm of unspecified behavior of brain: Secondary | ICD-10-CM | POA: Diagnosis not present

## 2024-01-17 DIAGNOSIS — E1122 Type 2 diabetes mellitus with diabetic chronic kidney disease: Secondary | ICD-10-CM | POA: Insufficient documentation

## 2024-01-17 DIAGNOSIS — Z794 Long term (current) use of insulin: Secondary | ICD-10-CM

## 2024-01-17 NOTE — Progress Notes (Signed)
Advanced Heart Failure Clinic Note    PCP: [name], MD (last seen 05/24) Cardiologist: Dorothyann Peng, MD (last seen 03/23)  Chief Complaint: shortness of breath  HPI:  [name] is a 63 y/o female with a history of DM, hyperlipidemia, HTN, brain tumor, left eye blindness, previous tobacco use and chronic heart failure.   Admitted 12/07/23 due to dizziness, instability and shortness of breath. She felt like she was leaning to the left side. Did not go to dialysis today. BNP elevated. Head CT negative. Dialysis session done and symptoms much improved.   Echo 02/27/20: EF of 30-35% along with mild MR. Echo 06/13/21: EF of 35-40% along with mild MR and mild/moderate TR.  Echo 12/08/23: EF 45-50% with mild LVH, Grade II DD, moderately elevated PA pressure of 51.8 mm Hg, moderate LAE, mild MR, mild/ moderate TR  She presents today for a HF follow-up visit with a chief complaint of moderate shortness of breath with minimal exertion. Chronic in nature although she feels like it has worsened over the last 1-2 months. She endorses worsened shortness of breath when laying down at night. She has associated occasional chest pain, palpitations, cough, dizziness, edema and decreased appetite along with this. She also endorses quite a bit of neuropathy in both legs/ feet which also interferes with her sleep. Denies abdominal distention.   Getting dialysis on M, W, F. Takes her furosemide on her non-dialysis days.   Past Medical History:  Diagnosis Date   Anemia    Angina at rest [facility])    Angioedema 03/20/2021   Lisinopril   Anterior cerebral aneurysm    approximately 5 mm; left MCA    Anxiety    Aortic atherosclerosis (HCC)    Asthma    Blind left eye    CHF (congestive heart failure) (HCC)    COPD (chronic obstructive pulmonary disease) (HCC)    Coronary artery disease    Depression    DOE (dyspnea on exertion)    ESRD (end stage renal disease) (HCC)    Gait instability     Genital herpes    GERD (gastroesophageal reflux disease)    HFrEF (heart failure with reduced ejection fraction) (HCC)    History of 2019 novel coronavirus disease (COVID-19) 03/2019   [facility] [place]   History of DVT (deep vein thrombosis)    History of DVT (deep vein thrombosis)    Hx of CABG 2018   x 4; performed in [place]   Hyperlipidemia    Hypertension    MI (myocardial infarction) (HCC) 2018   no stents   Pituitary adenoma (HCC)    PVD (posterior vitreous detachment), right eye    Secondary hyperparathyroidism (HCC)    Sleep apnea    non-compliant with nocturnal PAP therapy   Stroke [facility]) 2014   stated affected left side.   T2DM (type 2 diabetes mellitus) (HCC)     Current Outpatient Medications  Medication Sig Dispense Refill   acetaminophen (TYLENOL) 500 MG tablet Take 1,000 mg by mouth every 6 (six) hours as needed for mild pain or moderate pain.     albuterol (VENTOLIN HFA) 108 (90 Base) MCG/ACT inhaler Inhale 2 puffs into the lungs every 6 (six) hours as needed for wheezing or shortness of breath.     amLODipine (NORVASC) 10 MG tablet Take 1 tablet (10 mg total) by mouth daily. 30 tablet 0   aspirin 81 MG EC tablet Take 1 tablet (81 mg total) by  mouth daily. 30 tablet 0   atorvastatin (LIPITOR) 80 MG tablet Take 1 tablet (80 mg total) by mouth daily at 6 PM. 30 tablet 0   B Complex-C-Folic Acid (RENA-VITE RX) 1 MG TABS Take 1 tablet by mouth daily.     Blood Glucose Monitoring Suppl (TRUE METRIX METER) w/Device KIT      calcium acetate (PHOSLO) 667 MG capsule Take 1,334 mg by mouth 3 (three) times daily with meals.     carvedilol (COREG) 25 MG tablet Take 25 mg by mouth 2 (two) times daily.     famotidine (PEPCID) 40 MG tablet Take 1 tablet by mouth as needed for heartburn.     furosemide (LASIX) 80 MG tablet Take 80 mg by mouth 4 (four) times a week. Tues, Thurs, Sat, and Sun (non-dialysis days)     gabapentin (NEURONTIN) 300 MG capsule Take 300  mg by mouth daily.     lidocaine-prilocaine (EMLA) cream Apply 1 Application topically See admin instructions. Used on Mon Wed and Fri     methocarbamol (ROBAXIN) 500 MG tablet Take 1 tablet (500 mg total) by mouth 2 (two) times daily as needed for muscle spasms. 20 tablet 0   mirtazapine (REMERON) 30 MG tablet Take 30 mg by mouth at bedtime.     NOVOLOG MIX 70/30 FLEXPEN (70-30) 100 UNIT/ML FlexPen Inject 15 Units into the skin 3 (three) times daily with meals.     topiramate (TOPAMAX) 50 MG tablet Take 50 mg by mouth daily.     traZODone (DESYREL) 50 MG tablet Take 0.5 tablets (25 mg total) by mouth at bedtime. (Patient taking differently: Take 100 mg by mouth at bedtime.)     TRUE METRIX BLOOD GLUCOSE TEST test strip      vitamin B-12 (CYANOCOBALAMIN) 1000 MCG tablet Take 1,000 mcg by mouth daily.     No current facility-administered medications for this visit.    Allergies  Allergen Reactions   Hydralazine Hives, Shortness Of Breath, Swelling and Rash    Body aches    Kiwi Extract Hives, Swelling and Rash    Mouth swells    Lisinopril Swelling    Angioedema   Shellfish Allergy Anaphylaxis    Shrimp/lobster  Betadine leaves welts on skin     Betadine [Povidone Iodine] Hives and Rash    LEAVES WELTS ON SKIN   Septra [Sulfamethoxazole-Trimethoprim]    Metformin Diarrhea and Other (See Comments)    GI Intolerance   Tape Itching    Use paper tape whenever possible      Social History   Socioeconomic History   Marital status: Single    Spouse name: Not on file   Number of children: Not on file   Years of education: Not on file   Highest education level: Not on file  Occupational History    Comment: disabled  Tobacco Use   Smoking status: Former    Current packs/day: 0.00    Types: Cigarettes    Quit date: 2018    Years since quitting: 7.0   Smokeless tobacco: Never  Vaping Use   Vaping status: Never Used  Substance and Sexual Activity   Alcohol use: Not Currently    Drug use: Not Currently   Sexual activity: Not Currently  Other Topics Concern   Not on file  Social History Narrative   Patient lives alone.  Feels safe in her home.   Social Drivers of Health   Financial Resource Strain: Low Risk  (10/13/2023)  [facility]   Overall Financial Resource Strain (CARDIA)    Difficulty of Paying Living Expenses: Not very hard  Food Insecurity: No Food Insecurity (10/13/2023)   Received from [facility]   Hunger Vital Sign    Worried About Running Out of Food in the Last Year: Never true    Ran Out of Food in the Last Year: Never true  Transportation Needs: No Transportation Needs (10/13/2023)   Received from [facility] - Transportation    In the past 12 months, has lack of transportation kept you from medical appointments or from getting medications?: No    Lack of Transportation (Non-Medical): No  Physical Activity: Not on file  Stress: Not on file  Social Connections: Not on file  Intimate Partner Violence: Not At Risk (10/05/2022)   Humiliation, Afraid, Rape, and Kick questionnaire    Fear of Current or Ex-Partner: No    Emotionally Abused: No    Physically Abused: No    Sexually Abused: No      Family History  Problem Relation Age of Onset   CAD Mother    CAD Brother    Breast cancer Sister 46   Vitals:   01/17/24 1246 01/17/24 1247  BP: (!) 141/68   Pulse: (!) 54 (!) 53  SpO2: 92% 95%  Weight: 170 lb (77.1 kg)    Wt Readings from Last 3 Encounters:  01/17/24 170 lb (77.1 kg)  12/07/23 160 lb (72.6 kg)  05/26/23 160 lb 12.8 oz (72.9 kg)   Lab Results  Component Value Date   CREATININE 6.10 (H) 12/08/2023   CREATININE 7.82 (H) 12/07/2023   CREATININE 5.90 (H) 05/26/2023   PHYSICAL EXAM: General:  Well appearing. No respiratory difficulty HEENT: normal Neck: supple. no JVD. Carotids 2+ bilat; no bruits. No lymphadenopathy or thyromegaly  appreciated. Cor: PMI nondisplaced. Regular rhythm, bradycardic. No rubs, gallops or murmurs. Lungs: clear Abdomen: soft, nontender, nondistended. No hepatosplenomegaly. No bruits or masses. Good bowel sounds. Extremities: no cyanosis, clubbing, rash, trace pitting edema lower legs Neuro: alert & oriented x 3, cranial nerves grossly intact. moves all 4 extremities w/o difficulty. Affect pleasant.  ECG: not done   ASSESSMENT & PLAN:  1: NICM with reduced ejection fraction- - suspect due to HTN/ renal disease - NYHA class III - euvolemic today - weighing at dialysis as she says that her weight fluctuates in between sessions - weight up 7 pounds from last visit here 10 months ago - Echo 02/27/20: EF of 30-35% along with mild MR. - Echo 06/13/21: EF of 35-40% along with mild MR and mild/moderate TR.  - Echo 12/08/23: EF 45-50% with mild LVH, Grade II DD, moderately elevated PA pressure of 51.8 mm Hg, moderate LAE, mild MR, mild/ moderate TR - continue carvedilol 25mg  BID - continue furosemide 80mg  on non-dialysis days (T, TH, Sa, Su) - have reached out to nephrology [facility]) to let him know of patient's symptoms; he says that they will try challenging her at next dialysis session - renal function limits other GDMT - not adding salt  - saw cardiology Terry Arroyo) 03/23; put on her AVS the phone number to call his office - BNP 12/07/23 was 1576.0  2: HTN- - BP 141/68 - continue amlodipine 10mg  daily - saw PCP Terry Arroyo) 05/24  3: DM- - saw endocrinology Terry Arroyo) 07/21 - A1c 05/03/23 was 8.1%  4: ESRD- - BMP 12/08/23 reviewed and showed sodium 135, potassium 4.1,  creatinine 6.10 and GFR 7 - saw nephrology Terry Arroyo) 11/22 - saw vascular Terry Arroyo) 02/24 - dialysis M, W, F  Due to patient being on dialysis, will not make a return appointment at this time. Advised her of the importance of discussing any fluid retention issues/ symptoms with her nephrologist.    Delma Freeze,  FNP 01/17/24

## 2024-01-17 NOTE — Patient Instructions (Addendum)
Please call Dr. Glennis Brink office at [phone] to schedule a follow-up appointment.    Terry Arroyo has reached out to your kidney doctor since you are having kidney-related symptoms. Please follow up with them. If you continue to have reoccurring symptoms, please reach out to your kidney doctor, as they manage this for you.   Please call us or follow up as needed if you have any heart-related symptoms only.

## 2024-01-25 ENCOUNTER — Emergency Department
Admission: EM | Admit: 2024-01-25 | Discharge: 2024-01-25 | Disposition: A | Discharge status: Home / Self Care | Payer: 59 | Attending: Emergency Medicine | Admitting: Emergency Medicine

## 2024-01-25 ENCOUNTER — Other Ambulatory Visit: Payer: Self-pay

## 2024-01-25 DIAGNOSIS — T82838A Hemorrhage of vascular prosthetic devices, implants and grafts, initial encounter: Secondary | ICD-10-CM | POA: Insufficient documentation

## 2024-01-25 DIAGNOSIS — Z992 Dependence on renal dialysis: Secondary | ICD-10-CM | POA: Insufficient documentation

## 2024-01-25 NOTE — ED Notes (Signed)
 First nurse note: Pt from dialysis with slow bleeding fistula.   159/71 HR: 53 99% RA

## 2024-01-25 NOTE — ED Triage Notes (Signed)
Right arm AV fistula bleeding after dialysis today.  No current bleeding appreciated.

## 2024-01-25 NOTE — Discharge Instructions (Signed)
 You have been diagnosed with status post dialysis no active bleeding at the dialysis site.  Please make an appointment with your PCP or come back to ED if you have new symptoms or worsening symptoms

## 2024-01-25 NOTE — ED Provider Notes (Signed)
   [facility] Provider Note    Event Date/Time   First MD Initiated Contact with Patient 01/25/24 1712     (approximate)   History   Vascular Access Problem   HPI  Terry Arroyo is a 63 y.o. female [place] today after having dialysis, with active bleeding at  dialysis fistula.       Physical Exam   Triage Vital Signs: ED Triage Vitals  Encounter Vitals Group     BP 01/25/24 1703 (!) 154/67     Systolic BP Percentile --      Diastolic BP Percentile --      Pulse Rate 01/25/24 1703 (!) 55     Resp 01/25/24 1703 17     Temp 01/25/24 1703 98.3 F (36.8 C)     Temp Source 01/25/24 1703 Oral     SpO2 01/25/24 1703 94 %     Weight 01/25/24 1703 165 lb (74.8 kg)     Height 01/25/24 1703 5' 6 (1.676 m)     Head Circumference --      Peak Flow --      Pain Score 01/25/24 1711 0     Pain Loc --      Pain Education --      Exclude from Growth Chart --     Most recent vital signs: Vitals:   01/25/24 1703  BP: (!) 154/67  Pulse: (!) 55  Resp: 17  Temp: 98.3 F (36.8 C)  SpO2: 94%     Constitutional: Alert nondistressed Eyes: Conjunctivae are normal.  Head: Atraumatic. Nose: No congestion/rhinnorhea. Mouth/Throat: Mucous membranes are moist.   Neck: Painless ROM.  Cardiovascular:   Good peripheral circulation. Respiratory: Normal respiratory effort.  No retractions.  Gastrointestinal: Soft and nontender.  Musculoskeletal:  no deformity Neurologic:  MAE spontaneously. No gross focal neurologic deficits are appreciated.  Skin:  Skin is warm, dry and intact. No rash noted. Psychiatric: Mood and affect are normal. Speech and behavior are normal. Right arm; presence of dressing, no active bleeding  ED Results / Procedures / Treatments   Labs (all labs ordered are listed, but only abnormal results are displayed) Labs Reviewed - No data to display   EKG    RADIOLOGY    PROCEDURES:  Critical Care performed:    Procedures   MEDICATIONS ORDERED IN ED: Medications - No data to display   IMPRESSION / MDM / ASSESSMENT AND PLAN / ED COURSE  I reviewed the triage vital signs and the nursing notes.  Differential diagnosis includes, but is not limited to, hematoma, bleeding, abscess  Patient's presentation is most consistent with acute, uncomplicated illness.  Patient's diagnosis is consistent with dialysis fistula without active bleeding. I did review the patient's allergies and medications.  Patient is to follow up with PCP as needed or otherwise directed. Patient is given ED precautions to return to the ED for any worsening or new symptoms. Discussed plan of care with patient, answered all of patient's questions, Patient agreeable to plan of care. Patient verbalized understanding.     FINAL CLINICAL IMPRESSION(S) / ED DIAGNOSES   Final diagnoses:  None     Rx / DC Orders   ED Discharge Orders     None        Note:  This document was prepared using Dragon voice recognition software and may include unintentional dictation errors.   Janit Kast, PA-C 01/25/24 1718    Viviann Pastor, MD 01/25/24 2139

## 2024-01-25 NOTE — ED Provider Triage Note (Signed)
 Emergency Medicine Provider Triage Evaluation Note  Terry Arroyo , a 63 y.o. female  was evaluated in triage.  Pt complains of active bleeding at dialysis   fistula  Review of Systems  Positive:  Negative:   Physical Exam  BP (!) 154/67 (BP Location: Left Arm)   Pulse (!) 55   Temp 98.3 F (36.8 C) (Oral)   Resp 17   Ht 5' 6 (1.676 m)   Wt 74.8 kg   SpO2 94%   BMI 26.63 kg/m  Gen:   Awake, no distress   Resp:  Normal effort  MSK:   Moves extremities without difficulty  Other:  Right arm: Presence of dressing.  Checked it,  no active bleeding  Medical Decision Making  Medically screening exam initiated at 5:09 PM.  Appropriate orders placed.  Terry Arroyo was informed that the remainder of the evaluation will be completed by another provider, this initial triage assessment does not replace that evaluation, and the importance of remaining in the ED until their evaluation is complete.  Patient after dialysis, there is not active bleeding   Janit Kast, PA-C 01/25/24 1712

## 2024-02-16 ENCOUNTER — Ambulatory Visit (INDEPENDENT_AMBULATORY_CARE_PROVIDER_SITE_OTHER): Payer: 59

## 2024-02-16 ENCOUNTER — Ambulatory Visit (INDEPENDENT_AMBULATORY_CARE_PROVIDER_SITE_OTHER): Payer: Medicare HMO | Admitting: Nurse Practitioner

## 2024-02-16 ENCOUNTER — Encounter (INDEPENDENT_AMBULATORY_CARE_PROVIDER_SITE_OTHER): Payer: Self-pay | Admitting: Nurse Practitioner

## 2024-02-16 VITALS — BP 183/73 | HR 59 | Resp 18 | Ht 66.0 in | Wt 165.0 lb

## 2024-02-16 DIAGNOSIS — N186 End stage renal disease: Secondary | ICD-10-CM

## 2024-02-16 DIAGNOSIS — E785 Hyperlipidemia, unspecified: Secondary | ICD-10-CM

## 2024-02-16 DIAGNOSIS — I10 Essential (primary) hypertension: Secondary | ICD-10-CM

## 2024-02-17 NOTE — Progress Notes (Signed)
 Subjective:    Patient ID: Terry Arroyo, female    DOB: 02-06-1961, 63 y.o.   MRN: [id number] Chief Complaint  Patient presents with   Follow-up    . HDA/consult. post tx bleeding. possible stenosis. kolluru.    The patient returns to the office for follow up regarding a problem with their dialysis access.   The patient notes a significant increase in problems with dialysis.  It is reported that adequate dialysis is not being achieved.    The patient denies hand pain or other symptoms consistent with steal phenomena.  No significant arm swelling.  The patient denies redness or swelling at the access site. The patient denies fever or chills at home or while on dialysis.  No recent shortening of the patient's walking distance or new symptoms consistent with claudication.  No history of rest pain symptoms. No new ulcers or wounds of the lower extremities have occurred.  The patient denies amaurosis fugax or recent TIA symptoms. There are no recent neurological changes noted. There is no history of DVT, PE or superficial thrombophlebitis. No recent episodes of angina or shortness of breath documented.   Duplex ultrasound of the AV access shows a patent access.  The previously noted stenosis is significantly increased compared to last study.  Flow volume today is 1427 cc/min (previous flow volume was 2139 cc/min)    Review of Systems  All other systems reviewed and are negative.      Objective:   Physical Exam Vitals reviewed.  HENT:     Head: Normocephalic.  Cardiovascular:     Rate and Rhythm: Normal rate.     Pulses: Normal pulses.     Arteriovenous access: Left arteriovenous access is present.    Comments: Pulsatile bruit Pulmonary:     Effort: Pulmonary effort is normal.  Skin:    General: Skin is warm and dry.  Neurological:     Mental Status: She is alert and oriented to person, place, and time.  Psychiatric:        Mood and Affect: Mood normal.        Behavior:  Behavior normal.        Thought Content: Thought content normal.        Judgment: Judgment normal.     BP (!) 183/73   Pulse (!) 59   Resp 18   Ht 5\' 6"  (1.676 m)   Wt 165 lb (74.8 kg)   BMI 26.63 kg/m   Past Medical History:  Diagnosis Date   Anemia    Angina at rest [facility])    Angioedema 03/20/2021   Lisinopril   Anterior cerebral aneurysm    approximately 5 mm; left MCA    Anxiety    Aortic atherosclerosis (HCC)    Asthma    Blind left eye    CHF (congestive heart failure) (HCC)    COPD (chronic obstructive pulmonary disease) (HCC)    Coronary artery disease    Depression    DOE (dyspnea on exertion)    ESRD (end stage renal disease) (HCC)    Gait instability    Genital herpes    GERD (gastroesophageal reflux disease)    HFrEF (heart failure with reduced ejection fraction) (HCC)    History of 2019 novel coronavirus disease (COVID-19) 03/2019   [facility] [place]   History of DVT (deep vein thrombosis)    History of DVT (deep vein thrombosis)    Hx of CABG 2018   x  4; performed in [place]   Hyperlipidemia    Hypertension    MI (myocardial infarction) (HCC) 2018   no stents   Pituitary adenoma (HCC)    PVD (posterior vitreous detachment), right eye    Secondary hyperparathyroidism (HCC)    Sleep apnea    non-compliant with nocturnal PAP therapy   Stroke [facility]) 2014   stated affected left side.   T2DM (type 2 diabetes mellitus) (HCC)     Social History   Socioeconomic History   Marital status: Single    Spouse name: Not on file   Number of children: Not on file   Years of education: Not on file   Highest education level: Not on file  Occupational History    Comment: disabled  Tobacco Use   Smoking status: Former    Current packs/day: 0.00    Types: Cigarettes    Quit date: 2018    Years since quitting: 7.1   Smokeless tobacco: Never  Vaping Use   Vaping status: Never Used  Substance and Sexual Activity   Alcohol use: Not  Currently   Drug use: Not Currently   Sexual activity: Not Currently  Other Topics Concern   Not on file  Social History Narrative   Patient lives alone.  Feels safe in her home.   Social Drivers of Corporate investment banker Strain: Low Risk  (10/13/2023)   Received from [facility]   Overall Financial Resource Strain (CARDIA)    Difficulty of Paying Living Expenses: Not very hard  Food Insecurity: No Food Insecurity (10/13/2023)   Received from [facility]   Hunger Vital Sign    Worried About Running Out of Food in the Last Year: Never true    Ran Out of Food in the Last Year: Never true  Transportation Needs: No Transportation Needs (10/13/2023)   Received from [facility]    In the past 12 months, has lack of transportation kept you from medical appointments or from getting medications?: No    Lack of Transportation (Non-Medical): No  Physical Activity: Not on file  Stress: Not on file  Social Connections: Not on file  Intimate Partner Violence: Not At Risk (10/05/2022)   Humiliation, Afraid, Rape, and Kick questionnaire    Fear of Current or Ex-Partner: No    Emotionally Abused: No    Physically Abused: No    Sexually Abused: No    Past Surgical History:  Procedure Laterality Date   A/V FISTULAGRAM Right 12/29/2021   Procedure: A/V FISTULAGRAM;  Surgeon: Renford Dills, MD;  Location: [facility] INVASIVE CV LAB;  Service: Cardiovascular;  Laterality: Right;   A/V FISTULAGRAM Right 06/15/2022   Procedure: A/V Fistulagram;  Surgeon: Renford Dills, MD;  Location: [facility] INVASIVE CV LAB;  Service: Cardiovascular;  Laterality: Right;   AV FISTULA PLACEMENT Right 01/28/2021   Procedure: ARTERIOVENOUS (AV) FISTULA CREATION;  Surgeon: Renford Dills, MD;  Location: [facility] ORS;  Service: Vascular;  Laterality: Right;   AV FISTULA PLACEMENT Right 05/13/2021   Procedure: INSERTION OF ARTERIOVENOUS  (AV) GORE-TEX GRAFT ARM ( BRACHIAL AXILLARY );  Surgeon: Renford Dills, MD;  Location: [facility] ORS;  Service: Vascular;  Laterality: Right;   BRAIN SURGERY  1986   pituitary tumor   BREAST EXCISIONAL BIOPSY Left yrs ago   benign   CESAREAN SECTION     COLONOSCOPY N/A 05/26/2023   Procedure: COLONOSCOPY;  Surgeon: Elfredia Nevins  Casimiro Needle, DO;  Location: [facility] ENDOSCOPY;  Service: Gastroenterology;  Laterality: N/A;  DIALYSIS   CORONARY ARTERY BYPASS GRAFT  2018   x 4 vessels; performed in [place]   DIALYSIS/PERMA CATHETER INSERTION N/A 11/07/2020   Procedure: DIALYSIS/PERMA CATHETER INSERTION;  Surgeon: Annice Needy, MD;  Location: [facility] LAB;  Service: Cardiovascular;  Laterality: N/A;   DIALYSIS/PERMA CATHETER REMOVAL N/A 06/12/2021   Procedure: DIALYSIS/PERMA CATHETER REMOVAL;  Surgeon: Annice Needy, MD;  Location: [facility] LAB;  Service: Cardiovascular;  Laterality: N/A;   ESOPHAGOGASTRODUODENOSCOPY (EGD) WITH PROPOFOL N/A 12/22/2021   Procedure: ESOPHAGOGASTRODUODENOSCOPY (EGD) WITH PROPOFOL;  Surgeon: Regis Bill, MD;  Location: [facility] ENDOSCOPY;  Service: Gastroenterology;  Laterality: N/A;  IDDM   EYE SURGERY Right 2021   cataracts    PERIPHERAL VASCULAR THROMBECTOMY Right 04/03/2021   Procedure: A/V Fistulagram ;  Surgeon: Renford Dills, MD;  Location: [facility] INVAS[facility] CV LAB;  Service: Cardiovascular;  Laterality: Right;    Family History  Problem Relation Age of Onset   CAD Mother    CAD Brother    Breast cancer Sister 54    Allergies  Allergen Reactions   Hydralazine Hives, Shortness Of Breath, Swelling and Rash    Body aches    Kiwi Extract Hives, Swelling and Rash    Mouth swells    Lisinopril Swelling    Angioedema   Shellfish Allergy Anaphylaxis    Shrimp/lobster  Betadine leaves welts on skin     Betadine [Povidone Iodine] Hives and Rash    LEAVES WELTS ON SKIN   Septra [Sulfamethoxazole-Trimethoprim]    Metformin Diarrhea and Other  (See Comments)    GI Intolerance   Tape Itching    Use paper tape whenever possible       Latest Ref Rng & Units 12/08/2023    6:20 AM 12/07/2023    8:26 AM 05/26/2023   10:27 AM  CBC  WBC 4.0 - 10.5 K/uL 7.7  7.9    Hemoglobin 12.0 - 15.0 g/dL 40.9  81.1  91.4   Hematocrit 36.0 - 46.0 % 31.8  33.6  33.0   Platelets 150 - 400 K/uL 167  153        CMP     Component Value Date/Time   NA 135 12/08/2023 0620   NA 134 (L) 11/18/2013 0138   K 4.1 12/08/2023 0620   K 3.8 11/18/2013 0138   CL 98 12/08/2023 0620   CL 98 11/18/2013 0138   CO2 25 12/08/2023 0620   CO2 29 11/18/2013 0138   GLUCOSE 256 (H) 12/08/2023 0620   GLUCOSE 348 (H) 11/18/2013 0138   BUN 36 (H) 12/08/2023 0620   BUN 11 11/18/2013 0138   CREATININE 6.10 (H) 12/08/2023 0620   CREATININE 0.87 11/18/2013 0138   CALCIUM 8.5 (L) 12/08/2023 0620   CALCIUM 9.4 11/18/2013 0138   PROT 8.4 (H) 09/29/2022 1745   ALBUMIN 3.8 09/29/2022 1745   AST 16 09/29/2022 1745   ALT 12 09/29/2022 1745   ALKPHOS 139 (H) 09/29/2022 1745   BILITOT 0.8 09/29/2022 1745   GFRNONAA 7 (L) 12/08/2023 0620   GFRNONAA >60 11/18/2013 0138     No results found.     Assessment & Plan:   1. ESRD (end stage renal disease) (HCC) (Primary) Recommend:  The patient is experiencing increasing problems with their dialysis access.  Patient should have a fistulagram with the intention for intervention.  The intention for intervention is to restore appropriate  flow and prevent thrombosis and possible loss of the access.  As well as improve the quality of dialysis therapy.  The risks, benefits and alternative therapies were reviewed in detail with the patient.  All questions were answered.  The patient agrees to proceed with angio/intervention.    The patient will follow up with me in the office after the procedure.   2. Essential hypertension Continue antihypertensive medications as already ordered, these medications have been reviewed and  there are no changes at this time.  3. Dyslipidemia Continue statin as ordered and reviewed, no changes at this time   Current Outpatient Medications on File Prior to Visit  Medication Sig Dispense Refill   acetaminophen (TYLENOL) 500 MG tablet Take 1,000 mg by mouth every 6 (six) hours as needed for mild pain (pain score 1-3) or moderate pain (pain score 4-6).     albuterol (VENTOLIN HFA) 108 (90 Base) MCG/ACT inhaler Inhale 2 puffs into the lungs every 6 (six) hours as needed for wheezing or shortness of breath.     amLODipine (NORVASC) 10 MG tablet Take 1 tablet (10 mg total) by mouth daily. 30 tablet 0   aspirin 81 MG EC tablet Take 1 tablet (81 mg total) by mouth daily. 30 tablet 0   atorvastatin (LIPITOR) 80 MG tablet Take 1 tablet (80 mg total) by mouth daily at 6 PM. 30 tablet 0   B Complex-C-Folic Acid (RENA-VITE RX) 1 MG TABS Take 1 tablet by mouth daily.     Blood Glucose Monitoring Suppl (TRUE METRIX METER) w/Device KIT      calcium acetate (PHOSLO) 667 MG capsule Take 1,334 mg by mouth 3 (three) times daily with meals.     carvedilol (COREG) 25 MG tablet Take 25 mg by mouth 2 (two) times daily.     famotidine (PEPCID) 40 MG tablet Take 1 tablet by mouth as needed for heartburn.     furosemide (LASIX) 80 MG tablet Take 80 mg by mouth 4 (four) times a week. Tues, Thurs, Sat, and Sun (non-dialysis days)     gabapentin (NEURONTIN) 300 MG capsule Take 300 mg by mouth daily.     lidocaine-prilocaine (EMLA) cream Apply 1 Application topically See admin instructions. Used on Mon Wed and Fri     methocarbamol (ROBAXIN) 500 MG tablet Take 1 tablet (500 mg total) by mouth 2 (two) times daily as needed for muscle spasms. 20 tablet 0   mirtazapine (REMERON) 30 MG tablet Take 30 mg by mouth at bedtime.     NOVOLOG MIX 70/30 FLEXPEN (70-30) 100 UNIT/ML FlexPen Inject 15 Units into the skin 3 (three) times daily with meals.     topiramate (TOPAMAX) 50 MG tablet Take 50 mg by mouth daily.      traZODone (DESYREL) 50 MG tablet Take 0.5 tablets (25 mg total) by mouth at bedtime. (Patient taking differently: Take 100 mg by mouth at bedtime.)     TRUE METRIX BLOOD GLUCOSE TEST test strip      vitamin B-12 (CYANOCOBALAMIN) 1000 MCG tablet Take 1,000 mcg by mouth daily.     No current facility-administered medications on file prior to visit.    There are no Patient Instructions on file for this visit. No follow-ups on file.   Georgiana Spinner, NP

## 2024-04-10 ENCOUNTER — Telehealth (INDEPENDENT_AMBULATORY_CARE_PROVIDER_SITE_OTHER): Payer: Self-pay

## 2024-04-10 ENCOUNTER — Other Ambulatory Visit: Payer: Self-pay

## 2024-04-10 NOTE — Telephone Encounter (Signed)
 Spoke with the patient and she is scheduled with Dr. Prescilla Brod on 04/17/24 with a 12:00 pm arrival time to the [facility]. Pre-procedure instructions were discussed and will be mailed.

## 2024-04-12 ENCOUNTER — Encounter (INDEPENDENT_AMBULATORY_CARE_PROVIDER_SITE_OTHER)

## 2024-04-17 ENCOUNTER — Encounter
Admission: RE | Disposition: A | Discharge status: Home / Self Care | Payer: Self-pay | Source: Home / Self Care | Attending: Vascular Surgery

## 2024-04-17 ENCOUNTER — Encounter: Payer: Self-pay | Admitting: Vascular Surgery

## 2024-04-17 ENCOUNTER — Ambulatory Visit
Admission: RE | Admit: 2024-04-17 | Discharge: 2024-04-17 | Disposition: A | Discharge status: Home / Self Care | Attending: Vascular Surgery | Admitting: Vascular Surgery

## 2024-04-17 DIAGNOSIS — R001 Bradycardia, unspecified: Secondary | ICD-10-CM | POA: Diagnosis not present

## 2024-04-17 DIAGNOSIS — I4581 Long QT syndrome: Secondary | ICD-10-CM | POA: Diagnosis not present

## 2024-04-17 DIAGNOSIS — E785 Hyperlipidemia, unspecified: Secondary | ICD-10-CM | POA: Diagnosis not present

## 2024-04-17 DIAGNOSIS — Z87891 Personal history of nicotine dependence: Secondary | ICD-10-CM | POA: Diagnosis not present

## 2024-04-17 DIAGNOSIS — T82858A Stenosis of vascular prosthetic devices, implants and grafts, initial encounter: Secondary | ICD-10-CM

## 2024-04-17 DIAGNOSIS — N186 End stage renal disease: Secondary | ICD-10-CM | POA: Insufficient documentation

## 2024-04-17 DIAGNOSIS — Y832 Surgical operation with anastomosis, bypass or graft as the cause of abnormal reaction of the patient, or of later complication, without mention of misadventure at the time of the procedure: Secondary | ICD-10-CM | POA: Insufficient documentation

## 2024-04-17 DIAGNOSIS — Z79899 Other long term (current) drug therapy: Secondary | ICD-10-CM | POA: Diagnosis not present

## 2024-04-17 DIAGNOSIS — E1122 Type 2 diabetes mellitus with diabetic chronic kidney disease: Secondary | ICD-10-CM | POA: Diagnosis not present

## 2024-04-17 DIAGNOSIS — I5022 Chronic systolic (congestive) heart failure: Secondary | ICD-10-CM | POA: Diagnosis not present

## 2024-04-17 DIAGNOSIS — Z794 Long term (current) use of insulin: Secondary | ICD-10-CM | POA: Diagnosis not present

## 2024-04-17 DIAGNOSIS — Z992 Dependence on renal dialysis: Secondary | ICD-10-CM | POA: Insufficient documentation

## 2024-04-17 DIAGNOSIS — I132 Hypertensive heart and chronic kidney disease with heart failure and with stage 5 chronic kidney disease, or end stage renal disease: Secondary | ICD-10-CM | POA: Insufficient documentation

## 2024-04-17 HISTORY — PX: A/V FISTULAGRAM: CATH118298

## 2024-04-17 LAB — GLUCOSE, CAPILLARY
Glucose-Capillary: 229 mg/dL — ABNORMAL HIGH (ref 70–99)
Glucose-Capillary: 208 mg/dL — ABNORMAL HIGH (ref 70–99)

## 2024-04-17 LAB — POTASSIUM (ARMC VASCULAR LAB ONLY): Potassium (ARMC vascular lab): 4.4 mmol/L (ref 3.5–5.1)

## 2024-04-17 SURGERY — A/V FISTULAGRAM
Anesthesia: Moderate Sedation | Laterality: Right

## 2024-04-17 MED ORDER — METHYLPREDNISOLONE SODIUM SUCC 125 MG IJ SOLR
125.0000 mg | Freq: Once | INTRAMUSCULAR | Status: AC | PRN
Start: 2024-04-17 — End: 2024-04-17
  Administered 2024-04-17: 125 mg via INTRAVENOUS

## 2024-04-17 MED ORDER — HEPARIN SODIUM (PORCINE) 1000 UNIT/ML IJ SOLN
INTRAMUSCULAR | Status: AC
Start: 2024-04-17 — End: ?
  Filled 2024-04-17: qty 10

## 2024-04-17 MED ORDER — DIPHENHYDRAMINE HCL 50 MG/ML IJ SOLN
INTRAMUSCULAR | Status: AC
  Filled 2024-04-17: qty 1

## 2024-04-17 MED ORDER — METHYLPREDNISOLONE SODIUM SUCC 125 MG IJ SOLR
INTRAMUSCULAR | Status: AC
  Filled 2024-04-17: qty 2

## 2024-04-17 MED ORDER — SODIUM CHLORIDE 0.9 % IV SOLN: INTRAVENOUS | Status: DC

## 2024-04-17 MED ORDER — MIDAZOLAM HCL 2 MG/ML PO SYRP: 8.0000 mg | ORAL_SOLUTION | Freq: Once | ORAL | Status: DC | PRN

## 2024-04-17 MED ORDER — MIDAZOLAM HCL 2 MG/2ML IJ SOLN
INTRAMUSCULAR | Status: AC
  Filled 2024-04-17: qty 2

## 2024-04-17 MED ORDER — FAMOTIDINE 20 MG PO TABS
40.0000 mg | ORAL_TABLET | Freq: Once | ORAL | Status: AC | PRN
  Administered 2024-04-17: 40 mg via ORAL

## 2024-04-17 MED ORDER — ONDANSETRON HCL 4 MG/2ML IJ SOLN: 4.0000 mg | Freq: Four times a day (QID) | INTRAMUSCULAR | Status: DC | PRN

## 2024-04-17 MED ORDER — FENTANYL CITRATE PF 50 MCG/ML IJ SOSY
PREFILLED_SYRINGE | INTRAMUSCULAR | Status: AC
  Filled 2024-04-17: qty 1

## 2024-04-17 MED ORDER — IODIXANOL 320 MG/ML IV SOLN
INTRAVENOUS | Status: DC | PRN
  Administered 2024-04-17: 35 mL via INTRAVENOUS

## 2024-04-17 MED ORDER — CEFAZOLIN SODIUM-DEXTROSE 1-4 GM/50ML-% IV SOLN
1.0000 g | INTRAVENOUS | Status: AC
  Administered 2024-04-17: 1 g via INTRAVENOUS

## 2024-04-17 MED ORDER — DIPHENHYDRAMINE HCL 50 MG/ML IJ SOLN
50.0000 mg | Freq: Once | INTRAMUSCULAR | Status: AC | PRN
  Administered 2024-04-17: 50 mg via INTRAVENOUS

## 2024-04-17 MED ORDER — FENTANYL CITRATE (PF) 100 MCG/2ML IJ SOLN
INTRAMUSCULAR | Status: DC | PRN
  Administered 2024-04-17: 50 ug via INTRAVENOUS

## 2024-04-17 MED ORDER — MIDAZOLAM HCL 2 MG/2ML IJ SOLN
INTRAMUSCULAR | Status: DC | PRN
  Administered 2024-04-17: 2 mg via INTRAVENOUS

## 2024-04-17 MED ORDER — HEPARIN (PORCINE) IN NACL 1000-0.9 UT/500ML-% IV SOLN
INTRAVENOUS | Status: DC | PRN
  Administered 2024-04-17: 500 mL

## 2024-04-17 MED ORDER — LIDOCAINE HCL (PF) 1 % IJ SOLN
INTRAMUSCULAR | Status: DC | PRN
  Administered 2024-04-17: 5 mL via INTRADERMAL

## 2024-04-17 MED ORDER — CEFAZOLIN SODIUM-DEXTROSE 1-4 GM/50ML-% IV SOLN
INTRAVENOUS | Status: AC
  Filled 2024-04-17: qty 50

## 2024-04-17 MED ORDER — HEPARIN SODIUM (PORCINE) 1000 UNIT/ML IJ SOLN
INTRAMUSCULAR | Status: DC | PRN
  Administered 2024-04-17: 4000 [IU] via INTRAVENOUS

## 2024-04-17 MED ORDER — FAMOTIDINE 20 MG PO TABS
ORAL_TABLET | ORAL | Status: AC
  Filled 2024-04-17: qty 2

## 2024-04-17 MED ORDER — HYDROMORPHONE HCL 1 MG/ML IJ SOLN: 1.0000 mg | Freq: Once | INTRAMUSCULAR | Status: DC | PRN

## 2024-04-17 SURGICAL SUPPLY — 16 items
BALLOON DORADO 8X60X80 (BALLOONS) IMPLANT
BALLOON LUTONIX AV 8X60X75 (BALLOONS) IMPLANT
COVER PROBE ULTRASOUND 5X96 (MISCELLANEOUS) IMPLANT
DEVICE PRESTO INFLATION (MISCELLANEOUS) IMPLANT
DRAPE BRACHIAL (DRAPES) IMPLANT
GOWN STRL REUS W/ TWL LRG LVL3 (GOWN DISPOSABLE) ×1 IMPLANT
NDL ENTRY 21GA 7CM ECHOTIP (NEEDLE) IMPLANT
NEEDLE ENTRY 21GA 7CM ECHOTIP (NEEDLE) ×1 IMPLANT
PACK ANGIOGRAPHY (CUSTOM PROCEDURE TRAY) ×1 IMPLANT
SET INTRO CAPELLA COAXIAL (SET/KITS/TRAYS/PACK) IMPLANT
SHEATH BRITE TIP 6FRX5.5 (SHEATH) IMPLANT
SHEATH BRITE TIP 7FRX5.5 (SHEATH) IMPLANT
STENT VIABAHN 8X50X120 (Permanent Stent) IMPLANT
SUT MNCRL AB 4-0 PS2 18 (SUTURE) IMPLANT
WIRE G 018X200 V18 (WIRE) IMPLANT
WIRE NITINOL .018 (WIRE) IMPLANT

## 2024-04-17 NOTE — H&P (View-Only) (Signed)
 MRN : [id number]  Terry Arroyo is a 63 y.o. (1961/10/14) female who presents with chief complaint of check access.  History of Present Illness:  Patient presents to [facility] for treatment of her dialysis access which has been experiencing problems.  She was last seen in the office February 16, 2024. The patient notes a significant increase in problems with dialysis.  It is reported that adequate dialysis is not being achieved.    The patient denies hand pain or other symptoms consistent with steal phenomena.  No significant arm swelling.   The patient denies redness or swelling at the access site. The patient denies fever or chills at home or while on dialysis.   No recent shortening of the patient's walking distance or new symptoms consistent with claudication.  No history of rest pain symptoms. No new ulcers or wounds of the lower extremities have occurred.   The patient denies amaurosis fugax or recent TIA symptoms. There are no recent neurological changes noted. There is no history of DVT, PE or superficial thrombophlebitis. No recent episodes of angina or shortness of breath documented.    Duplex ultrasound of the AV access shows a patent access.  The previously noted stenosis is significantly increased compared to last study.  Flow volume today is 1427 cc/min (previous flow volume was 2139 cc/min)   Current Meds  Medication Sig   amLODipine  (NORVASC ) 10 MG tablet Take 1 tablet (10 mg total) by mouth daily.   aspirin  81 MG EC tablet Take 1 tablet (81 mg total) by mouth daily.   atorvastatin  (LIPITOR ) 80 MG tablet Take 1 tablet (80 mg total) by mouth daily at 6 PM.   B Complex-C-Folic Acid  (RENA-VITE RX) 1 MG TABS Take 1 tablet by mouth daily.   calcium  acetate (PHOSLO ) 667 MG capsule Take 1,334 mg by mouth 3 (three) times daily with meals.   carvedilol  (COREG ) 25 MG tablet Take 25 mg by mouth 2 (two) times daily.   famotidine   (PEPCID ) 40 MG tablet Take 1 tablet by mouth as needed for heartburn.   methocarbamol  (ROBAXIN ) 500 MG tablet Take 1 tablet (500 mg total) by mouth 2 (two) times daily as needed for muscle spasms.   mirtazapine  (REMERON ) 30 MG tablet Take 30 mg by mouth at bedtime.   NOVOLOG  MIX 70/30 FLEXPEN (70-30) 100 UNIT/ML FlexPen Inject 15 Units into the skin 3 (three) times daily with meals.   topiramate  (TOPAMAX ) 50 MG tablet Take 50 mg by mouth daily.   traZODone  (DESYREL ) 50 MG tablet Take 0.5 tablets (25 mg total) by mouth at bedtime. (Patient taking differently: Take 100 mg by mouth at bedtime.)   vitamin B-12 (CYANOCOBALAMIN ) 1000 MCG tablet Take 1,000 mcg by mouth daily.    Past Medical History:  Diagnosis Date   Anemia    Angina at rest [facility])    Angioedema 03/20/2021   Lisinopril    Anterior cerebral aneurysm    approximately 5 mm; left MCA    Anxiety    Aortic atherosclerosis (HCC)    Asthma    Blind left eye    CHF (congestive heart failure) (HCC)    COPD (chronic obstructive pulmonary disease) (HCC)    Coronary artery disease    Depression    DOE (dyspnea on exertion)    ESRD (end stage renal disease) (HCC)    Gait instability  Genital herpes    GERD (gastroesophageal reflux disease)    HFrEF (heart failure with reduced ejection fraction) (HCC)    History of 2019 novel coronavirus disease (COVID-19) 03/2019   [facility] [place]    History of DVT (deep vein thrombosis)    History of DVT (deep vein thrombosis)    Hx of CABG 2018   x 4; performed in [place]    Hyperlipidemia    Hypertension    MI (myocardial infarction) (HCC) 2018   no stents   Pituitary adenoma (HCC)    PVD (posterior vitreous detachment), right eye    Secondary hyperparathyroidism (HCC)    Sleep apnea    non-compliant with nocturnal PAP therapy   Stroke [facility]) 2014   stated affected left side.   T2DM (type 2 diabetes mellitus) [facility])     Past Surgical History:  Procedure Laterality  Date   A/V FISTULAGRAM Right 12/29/2021   Procedure: A/V FISTULAGRAM;  Surgeon: Jackquelyn Mass, MD;  Location: [facility] CV LAB;  Service: Cardiovascular;  Laterality: Right;   A/V FISTULAGRAM Right 06/15/2022   Procedure: A/V Fistulagram;  Surgeon: Jackquelyn Mass, MD;  Location: [facility] LAB;  Service: Cardiovascular;  Laterality: Right;   AV FISTULA PLACEMENT Right 01/28/2021   Procedure: ARTERIOVENOUS (AV) FISTULA CREATION;  Surgeon: Jackquelyn Mass, MD;  Location: [facility] ORS;  Service: Vascular;  Laterality: Right;   AV FISTULA PLACEMENT Right 05/13/2021   Procedure: INSERTION OF ARTERIOVENOUS (AV) GORE-TEX GRAFT ARM ( BRACHIAL AXILLARY );  Surgeon: Jackquelyn Mass, MD;  Location: [facility] ORS;  Service: Vascular;  Laterality: Right;   BRAIN SURGERY  1986   pituitary tumor   BREAST EXCISIONAL BIOPSY Left yrs ago   benign   CESAREAN SECTION     COLONOSCOPY N/A 05/26/2023   Procedure: COLONOSCOPY;  Surgeon: Quintin Buckle, DO;  Location: [facility] ENDOSCOPY;  Service: Gastroenterology;  Laterality: N/A;  DIALYSIS   CORONARY ARTERY BYPASS GRAFT  2018   x 4 vessels; performed in [place]    DIALYSIS/PERMA CATHETER INSERTION N/A 11/07/2020   Procedure: DIALYSIS/PERMA CATHETER INSERTION;  Surgeon: Celso College, MD;  Location: [facility] LAB;  Service: Cardiovascular;  Laterality: N/A;   DIALYSIS/PERMA CATHETER REMOVAL N/A 06/12/2021   Procedure: DIALYSIS/PERMA CATHETER REMOVAL;  Surgeon: Celso College, MD;  Location: [facility] CV LAB;  Service: Cardiovascular;  Laterality: N/A;   ESOPHAGOGASTRODUODENOSCOPY (EGD) WITH PROPOFOL  N/A 12/22/2021   Procedure: ESOPHAGOGASTRODUODENOSCOPY (EGD) WITH PROPOFOL ;  Surgeon: Shane Darling, MD;  Location: [facility] ENDOSCOPY;  Service: Gastroenterology;  Laterality: N/A;  IDDM   EYE SURGERY Right 2021   cataracts    PERIPHERAL VASCULAR THROMBECTOMY Right 04/03/2021   Procedure: A/V Fistulagram ;  Surgeon: Jackquelyn Mass, MD;   Location: [facility]VASIVE CV LAB;  Service: Cardiovascular;  Laterality: Right;    Social History Social History   Tobacco Use   Smoking status: Former    Current packs/day: 0.00    Types: Cigarettes    Quit date: 2018    Years since quitting: 7.3   Smokeless tobacco: Never  Vaping Use   Vaping status: Never Used  Substance Use Topics   Alcohol  use: Not Currently   Drug use: Not Currently    Family History Family History  Problem Relation Age of Onset   CAD Mother    CAD Brother    Breast cancer Sister 55    Allergies  Allergen Reactions   Hydralazine  Hives, Shortness Of Breath,  Swelling and Rash    Body aches    Kiwi Extract Hives, Swelling and Rash    Mouth swells    Lisinopril  Swelling    Angioedema   Shellfish Allergy Anaphylaxis    Shrimp/lobster  Betadine leaves welts on skin     Betadine [Povidone Iodine ] Hives and Rash    LEAVES WELTS ON SKIN   Septra [Sulfamethoxazole-Trimethoprim]    Metformin Diarrhea and Other (See Comments)    GI Intolerance   Tape Itching    Use paper tape whenever possible     REVIEW OF SYSTEMS (Negative unless checked)  Constitutional: [] Weight loss  [] Fever  [] Chills Cardiac: [] Chest pain   [] Chest pressure   [] Palpitations   [] Shortness of breath when laying flat   [] Shortness of breath with exertion. Vascular:  [] Pain in legs with walking   [] Pain in legs at rest  [] History of DVT   [] Phlebitis   [] Swelling in legs   [] Varicose veins   [] Non-healing ulcers Pulmonary:   [] Uses home oxygen   [] Productive cough   [] Hemoptysis   [] Wheeze  [] COPD   [] Asthma Neurologic:  [] Dizziness   [] Seizures   [] History of stroke   [] History of TIA  [] Aphasia   [] Vissual changes   [] Weakness or numbness in arm   [] Weakness or numbness in leg Musculoskeletal:   [] Joint swelling   [] Joint pain   [] Low back pain Hematologic:  [] Easy bruising  [] Easy bleeding   [] Hypercoagulable state   [] Anemic Gastrointestinal:  [] Diarrhea   [] Vomiting   [] Gastroesophageal reflux/heartburn   [] Difficulty swallowing. Genitourinary:  [x] Chronic kidney disease   [] Difficult urination  [] Frequent urination   [] Blood in urine Skin:  [] Rashes   [] Ulcers  Psychological:  [] History of anxiety   []  History of major depression.  Physical Examination  Vitals:   04/17/24 1145 04/17/24 1200 04/17/24 1321  BP:  131/63   Pulse:  (!) 51   Resp:  20   Temp:  97.7 F (36.5 C)   TempSrc:  Oral   SpO2:  97% 100%  Weight: 77.1 kg    Height: 5\' 6"  (1.676 m)     Body mass index is 27.42 kg/m. Gen: WD/WN, NAD Head: Barrett/AT, No temporalis wasting.  Ear/Nose/Throat: Hearing grossly intact, nares w/o erythema or drainage Eyes: PER, EOMI, sclera nonicteric.  Neck: Supple, no gross masses or lesions.  No JVD.  Pulmonary:  Good air movement, no audible wheezing, no use of accessory muscles.  Cardiac: RRR, precordium non-hyperdynamic. Vascular:   Left arm AV access is markedly pulsatile with a weak thrill and poor bruit Vessel Right Left  Radial Palpable Palpable  Brachial Palpable Palpable  Gastrointestinal: soft, non-distended. No guarding/no peritoneal signs.  Musculoskeletal: M/S 5/5 throughout.  No deformity.  Neurologic: CN 2-12 intact. Pain and light touch intact in extremities.  Symmetrical.  Speech is fluent. Motor exam as listed above. Psychiatric: Judgment intact, Mood & affect appropriate for pt's clinical situation. Dermatologic: No rashes or ulcers noted.  No changes consistent with cellulitis.   CBC Lab Results  Component Value Date   WBC 7.7 12/08/2023   HGB 10.4 (L) 12/08/2023   HCT 31.8 (L) 12/08/2023   MCV 93.3 12/08/2023   PLT 167 12/08/2023    BMET    Component Value Date/Time   NA 135 12/08/2023 0620   NA 134 (L) 11/18/2013 0138   K 4.1 12/08/2023 0620   K 3.8 11/18/2013 0138   CL 98 12/08/2023 0620   CL 98 11/18/2013 0138  CO2 25 12/08/2023 0620   CO2 29 11/18/2013 0138   GLUCOSE 256 (H) 12/08/2023 0620   GLUCOSE  348 (H) 11/18/2013 0138   BUN 36 (H) 12/08/2023 0620   BUN 11 11/18/2013 0138   CREATININE 6.10 (H) 12/08/2023 0620   CREATININE 0.87 11/18/2013 0138   CALCIUM  8.5 (L) 12/08/2023 0620   CALCIUM  9.4 11/18/2013 0138   GFRNONAA 7 (L) 12/08/2023 0620   GFRNONAA >60 11/18/2013 0138   GFRAA 18 (L) 09/15/2020 0403   GFRAA >60 11/18/2013 0138   CrCl cannot be calculated (Patient's most recent lab result is older than the maximum 21 days allowed.).  COAG Lab Results  Component Value Date   INR 1.2 03/29/2022   INR 1.0 05/07/2021   INR 1.1 01/20/2021    Radiology No results found.   Assessment/Plan 1. ESRD (end stage renal disease) (HCC) (Primary) Recommend:   The patient is experiencing increasing problems with their dialysis access.   Patient should have a fistulagram with the intention for intervention.  The intention for intervention is to restore appropriate flow and prevent thrombosis and possible loss of the access.  As well as improve the quality of dialysis therapy.   The risks, benefits and alternative therapies were reviewed in detail with the patient.  All questions were answered.  The patient agrees to proceed with angio/intervention.     The patient will follow up with me in the office after the procedure.     2. Essential hypertension Continue antihypertensive medications as already ordered, these medications have been reviewed and there are no changes at this time.   3. Dyslipidemia Continue statin as ordered and reviewed, no changes at this time   Devon Fogo, MD  04/17/2024 1:32 PM

## 2024-04-17 NOTE — Progress Notes (Signed)
 Pt. Denies any c/o CP, SOB, N/V, dizziness, HA at present. Explained to pt. To please call her primary and/or cardiologist if CP returns. Pt. States "I need to make an appt." Pt. In no acute distress for DC home. Pt. Told me nephew to pick her up, and now says it is her Father.

## 2024-04-17 NOTE — Progress Notes (Signed)
 MRN : [id number]  Terry Arroyo is a 63 y.o. (1961/10/14) female who presents with chief complaint of check access.  History of Present Illness:  Patient presents to [facility] for treatment of her dialysis access which has been experiencing problems.  She was last seen in the office February 16, 2024. The patient notes a significant increase in problems with dialysis.  It is reported that adequate dialysis is not being achieved.    The patient denies hand pain or other symptoms consistent with steal phenomena.  No significant arm swelling.   The patient denies redness or swelling at the access site. The patient denies fever or chills at home or while on dialysis.   No recent shortening of the patient's walking distance or new symptoms consistent with claudication.  No history of rest pain symptoms. No new ulcers or wounds of the lower extremities have occurred.   The patient denies amaurosis fugax or recent TIA symptoms. There are no recent neurological changes noted. There is no history of DVT, PE or superficial thrombophlebitis. No recent episodes of angina or shortness of breath documented.    Duplex ultrasound of the AV access shows a patent access.  The previously noted stenosis is significantly increased compared to last study.  Flow volume today is 1427 cc/min (previous flow volume was 2139 cc/min)   Current Meds  Medication Sig   amLODipine  (NORVASC ) 10 MG tablet Take 1 tablet (10 mg total) by mouth daily.   aspirin  81 MG EC tablet Take 1 tablet (81 mg total) by mouth daily.   atorvastatin  (LIPITOR ) 80 MG tablet Take 1 tablet (80 mg total) by mouth daily at 6 PM.   B Complex-C-Folic Acid  (RENA-VITE RX) 1 MG TABS Take 1 tablet by mouth daily.   calcium  acetate (PHOSLO ) 667 MG capsule Take 1,334 mg by mouth 3 (three) times daily with meals.   carvedilol  (COREG ) 25 MG tablet Take 25 mg by mouth 2 (two) times daily.   famotidine   (PEPCID ) 40 MG tablet Take 1 tablet by mouth as needed for heartburn.   methocarbamol  (ROBAXIN ) 500 MG tablet Take 1 tablet (500 mg total) by mouth 2 (two) times daily as needed for muscle spasms.   mirtazapine  (REMERON ) 30 MG tablet Take 30 mg by mouth at bedtime.   NOVOLOG  MIX 70/30 FLEXPEN (70-30) 100 UNIT/ML FlexPen Inject 15 Units into the skin 3 (three) times daily with meals.   topiramate  (TOPAMAX ) 50 MG tablet Take 50 mg by mouth daily.   traZODone  (DESYREL ) 50 MG tablet Take 0.5 tablets (25 mg total) by mouth at bedtime. (Patient taking differently: Take 100 mg by mouth at bedtime.)   vitamin B-12 (CYANOCOBALAMIN ) 1000 MCG tablet Take 1,000 mcg by mouth daily.    Past Medical History:  Diagnosis Date   Anemia    Angina at rest [facility])    Angioedema 03/20/2021   Lisinopril    Anterior cerebral aneurysm    approximately 5 mm; left MCA    Anxiety    Aortic atherosclerosis (HCC)    Asthma    Blind left eye    CHF (congestive heart failure) (HCC)    COPD (chronic obstructive pulmonary disease) (HCC)    Coronary artery disease    Depression    DOE (dyspnea on exertion)    ESRD (end stage renal disease) (HCC)    Gait instability  Genital herpes    GERD (gastroesophageal reflux disease)    HFrEF (heart failure with reduced ejection fraction) (HCC)    History of 2019 novel coronavirus disease (COVID-19) 03/2019   [facility] [place]    History of DVT (deep vein thrombosis)    History of DVT (deep vein thrombosis)    Hx of CABG 2018   x 4; performed in [place]    Hyperlipidemia    Hypertension    MI (myocardial infarction) (HCC) 2018   no stents   Pituitary adenoma (HCC)    PVD (posterior vitreous detachment), right eye    Secondary hyperparathyroidism (HCC)    Sleep apnea    non-compliant with nocturnal PAP therapy   Stroke [facility]) 2014   stated affected left side.   T2DM (type 2 diabetes mellitus) [facility])     Past Surgical History:  Procedure Laterality  Date   A/V FISTULAGRAM Right 12/29/2021   Procedure: A/V FISTULAGRAM;  Surgeon: Jackquelyn Mass, MD;  Location: [facility] INVASIVE CV LAB;  Service: Cardiovascular;  Laterality: Right;   A/V FISTULAGRAM Right 06/15/2022   Procedure: A/V Fistulagram;  Surgeon: Jackquelyn Mass, MD;  Location: [facility] INVASIVE CV LAB;  Service: Cardiovascular;  Laterality: Right;   AV FISTULA PLACEMENT Right 01/28/2021   Procedure: ARTERIOVENOUS (AV) FISTULA CREATION;  Surgeon: Jackquelyn Mass, MD;  Location: [facility] ORS;  Service: Vascular;  Laterality: Right;   AV FISTULA PLACEMENT Right 05/13/2021   Procedure: INSERTION OF ARTERIOVENOUS (AV) GORE-TEX GRAFT ARM ( BRACHIAL AXILLARY );  Surgeon: Jackquelyn Mass, MD;  Location: [facility] ORS;  Service: Vascular;  Laterality: Right;   BRAIN SURGERY  1986   pituitary tumor   BREAST EXCISIONAL BIOPSY Left yrs ago   benign   CESAREAN SECTION     COLONOSCOPY N/A 05/26/2023   Procedure: COLONOSCOPY;  Surgeon: Quintin Buckle, DO;  Location: [facility] ENDOSCOPY;  Service: Gastroenterology;  Laterality: N/A;  DIALYSIS   CORONARY ARTERY BYPASS GRAFT  2018   x 4 vessels; performed in [place]    DIALYSIS/PERMA CATHETER INSERTION N/A 11/07/2020   Procedure: DIALYSIS/PERMA CATHETER INSERTION;  Surgeon: Celso College, MD;  Location: [facility] INVASIVE CV LAB;  Service: Cardiovascular;  Laterality: N/A;   DIALYSIS/PERMA CATHETER REMOVAL N/A 06/12/2021   Procedure: DIALYSIS/PERMA CATHETER REMOVAL;  Surgeon: Celso College, MD;  Location: [facility] INVASIVE CV LAB;  Service: Cardiovascular;  Laterality: N/A;   ESOPHAGOGASTRODUODENOSCOPY (EGD) WITH PROPOFOL  N/A 12/22/2021   Procedure: ESOPHAGOGASTRODUODENOSCOPY (EGD) WITH PROPOFOL ;  Surgeon: Shane Darling, MD;  Location: [facility] ENDOSCOPY;  Service: Gastroenterology;  Laterality: N/A;  IDDM   EYE SURGERY Right 2021   cataracts    PERIPHERAL VASCULAR THROMBECTOMY Right 04/03/2021   Procedure: A/V Fistulagram ;  Surgeon: Jackquelyn Mass, MD;   Location: [facility] INVASIVE CV LAB;  Service: Cardiovascular;  Laterality: Right;    Social History Social History   Tobacco Use   Smoking status: Former    Current packs/day: 0.00    Types: Cigarettes    Quit date: 2018    Years since quitting: 7.3   Smokeless tobacco: Never  Vaping Use   Vaping status: Never Used  Substance Use Topics   Alcohol  use: Not Currently   Drug use: Not Currently    Family History Family History  Problem Relation Age of Onset   CAD Mother    CAD Brother    Breast cancer Sister 55    Allergies  Allergen Reactions   Hydralazine  Hives, Shortness Of Breath,  Swelling and Rash    Body aches    Kiwi Extract Hives, Swelling and Rash    Mouth swells    Lisinopril  Swelling    Angioedema   Shellfish Allergy Anaphylaxis    Shrimp/lobster  Betadine leaves welts on skin     Betadine [Povidone Iodine ] Hives and Rash    LEAVES WELTS ON SKIN   Septra [Sulfamethoxazole-Trimethoprim]    Metformin Diarrhea and Other (See Comments)    GI Intolerance   Tape Itching    Use paper tape whenever possible     REVIEW OF SYSTEMS (Negative unless checked)  Constitutional: [] Weight loss  [] Fever  [] Chills Cardiac: [] Chest pain   [] Chest pressure   [] Palpitations   [] Shortness of breath when laying flat   [] Shortness of breath with exertion. Vascular:  [] Pain in legs with walking   [] Pain in legs at rest  [] History of DVT   [] Phlebitis   [] Swelling in legs   [] Varicose veins   [] Non-healing ulcers Pulmonary:   [] Uses home oxygen   [] Productive cough   [] Hemoptysis   [] Wheeze  [] COPD   [] Asthma Neurologic:  [] Dizziness   [] Seizures   [] History of stroke   [] History of TIA  [] Aphasia   [] Vissual changes   [] Weakness or numbness in arm   [] Weakness or numbness in leg Musculoskeletal:   [] Joint swelling   [] Joint pain   [] Low back pain Hematologic:  [] Easy bruising  [] Easy bleeding   [] Hypercoagulable state   [] Anemic Gastrointestinal:  [] Diarrhea   [] Vomiting   [] Gastroesophageal reflux/heartburn   [] Difficulty swallowing. Genitourinary:  [x] Chronic kidney disease   [] Difficult urination  [] Frequent urination   [] Blood in urine Skin:  [] Rashes   [] Ulcers  Psychological:  [] History of anxiety   []  History of major depression.  Physical Examination  Vitals:   04/17/24 1145 04/17/24 1200 04/17/24 1321  BP:  131/63   Pulse:  (!) 51   Resp:  20   Temp:  97.7 F (36.5 C)   TempSrc:  Oral   SpO2:  97% 100%  Weight: 77.1 kg    Height: 5\' 6"  (1.676 m)     Body mass index is 27.42 kg/m. Gen: WD/WN, NAD Head: Barrett/AT, No temporalis wasting.  Ear/Nose/Throat: Hearing grossly intact, nares w/o erythema or drainage Eyes: PER, EOMI, sclera nonicteric.  Neck: Supple, no gross masses or lesions.  No JVD.  Pulmonary:  Good air movement, no audible wheezing, no use of accessory muscles.  Cardiac: RRR, precordium non-hyperdynamic. Vascular:   Left arm AV access is markedly pulsatile with a weak thrill and poor bruit Vessel Right Left  Radial Palpable Palpable  Brachial Palpable Palpable  Gastrointestinal: soft, non-distended. No guarding/no peritoneal signs.  Musculoskeletal: M/S 5/5 throughout.  No deformity.  Neurologic: CN 2-12 intact. Pain and light touch intact in extremities.  Symmetrical.  Speech is fluent. Motor exam as listed above. Psychiatric: Judgment intact, Mood & affect appropriate for pt's clinical situation. Dermatologic: No rashes or ulcers noted.  No changes consistent with cellulitis.   CBC Lab Results  Component Value Date   WBC 7.7 12/08/2023   HGB 10.4 (L) 12/08/2023   HCT 31.8 (L) 12/08/2023   MCV 93.3 12/08/2023   PLT 167 12/08/2023    BMET    Component Value Date/Time   NA 135 12/08/2023 0620   NA 134 (L) 11/18/2013 0138   K 4.1 12/08/2023 0620   K 3.8 11/18/2013 0138   CL 98 12/08/2023 0620   CL 98 11/18/2013 0138  CO2 25 12/08/2023 0620   CO2 29 11/18/2013 0138   GLUCOSE 256 (H) 12/08/2023 0620   GLUCOSE  348 (H) 11/18/2013 0138   BUN 36 (H) 12/08/2023 0620   BUN 11 11/18/2013 0138   CREATININE 6.10 (H) 12/08/2023 0620   CREATININE 0.87 11/18/2013 0138   CALCIUM  8.5 (L) 12/08/2023 0620   CALCIUM  9.4 11/18/2013 0138   GFRNONAA 7 (L) 12/08/2023 0620   GFRNONAA >60 11/18/2013 0138   GFRAA 18 (L) 09/15/2020 0403   GFRAA >60 11/18/2013 0138   CrCl cannot be calculated (Patient's most recent lab result is older than the maximum 21 days allowed.).  COAG Lab Results  Component Value Date   INR 1.2 03/29/2022   INR 1.0 05/07/2021   INR 1.1 01/20/2021    Radiology No results found.   Assessment/Plan 1. ESRD (end stage renal disease) (HCC) (Primary) Recommend:   The patient is experiencing increasing problems with their dialysis access.   Patient should have a fistulagram with the intention for intervention.  The intention for intervention is to restore appropriate flow and prevent thrombosis and possible loss of the access.  As well as improve the quality of dialysis therapy.   The risks, benefits and alternative therapies were reviewed in detail with the patient.  All questions were answered.  The patient agrees to proceed with angio/intervention.     The patient will follow up with me in the office after the procedure.     2. Essential hypertension Continue antihypertensive medications as already ordered, these medications have been reviewed and there are no changes at this time.   3. Dyslipidemia Continue statin as ordered and reviewed, no changes at this time   Devon Fogo, MD  04/17/2024 1:32 PM

## 2024-04-17 NOTE — Progress Notes (Signed)
 Pt. C/o intermittent CP on arrival to unit. States it was a 2 out of 10 on scale 1-10. States she has not seen her cardiologist in a year. Currently denies CP: Dr. Prescilla Brod here now & made aware. 12 lead EKG done with no acute findings.

## 2024-04-17 NOTE — Interval H&P Note (Signed)
 History and Physical Interval Note:  04/17/2024 1:34 PM  Terry Arroyo  has presented today for surgery, with the diagnosis of R arm fistulagram   End Stage Renal.  The various methods of treatment have been discussed with the patient and family. After consideration of risks, benefits and other options for treatment, the patient has consented to  Procedure(s): A/V Fistulagram (Right) as a surgical intervention.  The patient's history has been reviewed, patient examined, no change in status, stable for surgery.  I have reviewed the patient's chart and labs.  Questions were answered to the patient's satisfaction.     Devon Fogo

## 2024-04-17 NOTE — Op Note (Signed)
 OPERATIVE NOTE   PROCEDURE: Contrast injection right arm brachial axillary AV access Percutaneous transluminal angioplasty and stent placement right arm brachial axillary AV access peripheral segment  PRE-OPERATIVE DIAGNOSIS: Complication of dialysis access                                                       End Stage Renal Disease  POST-OPERATIVE DIAGNOSIS: same as above   SURGEON: Jackquelyn Mass, M.D.  ANESTHESIA: Conscious sedation was administered under my direct supervision by the interventional radiology RN. IV Versed  plus fentanyl  were utilized. Continuous ECG, pulse oximetry and blood pressure was monitored throughout the entire procedure.  Conscious sedation was for a total of 38.  ESTIMATED BLOOD LOSS: minimal  FINDING(S): 80% narrowing at the site of venous cannulation within the graft itself peripheral segment  FLUOROSCOPY TIME: 2.3 minutes  INDICATIONS: Terry Arroyo is a 63 y.o. female who  presents with malfunctioning right arm AV access.  The patient is scheduled for angiography with possible intervention of the AV access.  The patient is aware the risks include but are not limited to: bleeding, infection, thrombosis of the cannulated access, and possible anaphylactic reaction to the contrast.  The patient acknowledges if the access can not be salvaged a tunneled catheter will be needed and will be placed during this procedure.  The patient is aware of the risks of the procedure and elects to proceed with the angiogram and intervention.  DESCRIPTION: After full informed written consent was obtained, the patient was brought back to the Special Procedure suite and placed supine position.  Appropriate cardiopulmonary monitors were placed.  The right arm was prepped and draped in the standard fashion.  Appropriate timeout is called. The right brachial axillary AV graft was cannulated with a micropuncture needle.  Cannulation was performed with ultrasound guidance.  Ultrasound was placed in a sterile sleeve, the AV access was interrogated and noted to be echolucent and compressible indicating patency. Image was recorded for the permanent record. The puncture is performed under continuous ultrasound visualization.   The microwire was advanced and the needle was exchanged for  a microsheath.  The J-wire was then advanced and a 6 Fr sheath inserted.  Hand injections were completed to image the access from the arterial anastomosis through the entire access.  The central venous structures were also imaged by hand injections.  Interpretation: The brachial axillary AV graft demonstrates an 80% stenosis in the area that they are utilizing for venous cannulation.  This is the peripheral segment.  Previously placed venous outflow stent is widely patent.  The axillary subclavian and central veins are widely patent.  Reflux of contrast into the brachial artery demonstrates a widely patent arterial anastomosis with wide patency of the visualized portion of the brachial artery.  Based on the images,  4000 units of heparin  was given and a wire was negotiated through the strictures within the venous portion of the graft.  An 8 mm x 50 mm Viabahn was deployed across the stenoses and postdilated with an 8 mm Dorado balloon inflated to 16 atm for approximately 1 minute.  Follow-up imaging demonstrates complete resolution of the stricture with rapid flow of contrast through the graft, the central venous anatomy is preserved.  A 4-0 Monocryl purse-string suture was sewn around the sheath.  The sheath was  removed and light pressure was applied.  A sterile bandage was applied to the puncture site.    COMPLICATIONS: None  CONDITION: [name], M.D [facility] Office: [phone]  04/17/2024 2:10 PM

## 2024-04-23 ENCOUNTER — Emergency Department

## 2024-04-23 ENCOUNTER — Encounter (HOSPITAL_COMMUNITY)
Admission: EM | Disposition: A | Discharge status: Home Health | Payer: Self-pay | Source: Ambulatory Visit | Attending: Neurology

## 2024-04-23 ENCOUNTER — Inpatient Hospital Stay: Admit: 2024-04-23

## 2024-04-23 ENCOUNTER — Emergency Department
Admission: EM | Admit: 2024-04-23 | Discharge: 2024-04-23 | Disposition: A | Discharge status: Other Acute Inpatient Hospital | Attending: Emergency Medicine | Admitting: Emergency Medicine

## 2024-04-23 ENCOUNTER — Emergency Department (HOSPITAL_COMMUNITY): Admitting: Certified Registered Nurse Anesthetist

## 2024-04-23 ENCOUNTER — Other Ambulatory Visit: Payer: Self-pay

## 2024-04-23 ENCOUNTER — Inpatient Hospital Stay (HOSPITAL_COMMUNITY)
Admission: EM | Admit: 2024-04-23 | Discharge: 2024-04-28 | DRG: 034 | Disposition: A | Discharge status: Home Health | Attending: Neurology | Admitting: Neurology

## 2024-04-23 ENCOUNTER — Emergency Department (HOSPITAL_COMMUNITY)

## 2024-04-23 ENCOUNTER — Inpatient Hospital Stay: Admission: RE | Admit: 2024-04-23 | Source: Home / Self Care

## 2024-04-23 DIAGNOSIS — Z6827 Body mass index (BMI) 27.0-27.9, adult: Secondary | ICD-10-CM

## 2024-04-23 DIAGNOSIS — E1122 Type 2 diabetes mellitus with diabetic chronic kidney disease: Secondary | ICD-10-CM | POA: Insufficient documentation

## 2024-04-23 DIAGNOSIS — R471 Dysarthria and anarthria: Secondary | ICD-10-CM | POA: Diagnosis present

## 2024-04-23 DIAGNOSIS — I252 Old myocardial infarction: Secondary | ICD-10-CM

## 2024-04-23 DIAGNOSIS — Z7982 Long term (current) use of aspirin: Secondary | ICD-10-CM

## 2024-04-23 DIAGNOSIS — M546 Pain in thoracic spine: Secondary | ICD-10-CM | POA: Insufficient documentation

## 2024-04-23 DIAGNOSIS — I639 Cerebral infarction, unspecified: Principal | ICD-10-CM | POA: Diagnosis present

## 2024-04-23 DIAGNOSIS — I132 Hypertensive heart and chronic kidney disease with heart failure and with stage 5 chronic kidney disease, or end stage renal disease: Secondary | ICD-10-CM | POA: Diagnosis present

## 2024-04-23 DIAGNOSIS — Z79899 Other long term (current) drug therapy: Secondary | ICD-10-CM | POA: Insufficient documentation

## 2024-04-23 DIAGNOSIS — E669 Obesity, unspecified: Secondary | ICD-10-CM | POA: Diagnosis present

## 2024-04-23 DIAGNOSIS — Z8249 Family history of ischemic heart disease and other diseases of the circulatory system: Secondary | ICD-10-CM | POA: Insufficient documentation

## 2024-04-23 DIAGNOSIS — R4701 Aphasia: Secondary | ICD-10-CM | POA: Diagnosis present

## 2024-04-23 DIAGNOSIS — I63232 Cerebral infarction due to unspecified occlusion or stenosis of left carotid arteries: Secondary | ICD-10-CM | POA: Diagnosis present

## 2024-04-23 DIAGNOSIS — Z86718 Personal history of other venous thrombosis and embolism: Secondary | ICD-10-CM

## 2024-04-23 DIAGNOSIS — Z7902 Long term (current) use of antithrombotics/antiplatelets: Secondary | ICD-10-CM

## 2024-04-23 DIAGNOSIS — I63512 Cerebral infarction due to unspecified occlusion or stenosis of left middle cerebral artery: Secondary | ICD-10-CM | POA: Insufficient documentation

## 2024-04-23 DIAGNOSIS — R29810 Facial weakness: Secondary | ICD-10-CM | POA: Diagnosis present

## 2024-04-23 DIAGNOSIS — E785 Hyperlipidemia, unspecified: Secondary | ICD-10-CM | POA: Diagnosis present

## 2024-04-23 DIAGNOSIS — I5022 Chronic systolic (congestive) heart failure: Secondary | ICD-10-CM | POA: Diagnosis not present

## 2024-04-23 DIAGNOSIS — R29704 NIHSS score 4: Secondary | ICD-10-CM | POA: Diagnosis not present

## 2024-04-23 DIAGNOSIS — M549 Dorsalgia, unspecified: Secondary | ICD-10-CM

## 2024-04-23 DIAGNOSIS — I6522 Occlusion and stenosis of left carotid artery: Secondary | ICD-10-CM

## 2024-04-23 DIAGNOSIS — I671 Cerebral aneurysm, nonruptured: Secondary | ICD-10-CM | POA: Diagnosis present

## 2024-04-23 DIAGNOSIS — I6389 Other cerebral infarction: Secondary | ICD-10-CM | POA: Diagnosis not present

## 2024-04-23 DIAGNOSIS — I509 Heart failure, unspecified: Secondary | ICD-10-CM | POA: Diagnosis not present

## 2024-04-23 DIAGNOSIS — I5042 Chronic combined systolic (congestive) and diastolic (congestive) heart failure: Secondary | ICD-10-CM | POA: Diagnosis present

## 2024-04-23 DIAGNOSIS — Z951 Presence of aortocoronary bypass graft: Secondary | ICD-10-CM

## 2024-04-23 DIAGNOSIS — E113291 Type 2 diabetes mellitus with mild nonproliferative diabetic retinopathy without macular edema, right eye: Secondary | ICD-10-CM | POA: Diagnosis present

## 2024-04-23 DIAGNOSIS — E1142 Type 2 diabetes mellitus with diabetic polyneuropathy: Secondary | ICD-10-CM | POA: Diagnosis present

## 2024-04-23 DIAGNOSIS — I676 Nonpyogenic thrombosis of intracranial venous system: Secondary | ICD-10-CM | POA: Diagnosis present

## 2024-04-23 DIAGNOSIS — Z992 Dependence on renal dialysis: Secondary | ICD-10-CM | POA: Insufficient documentation

## 2024-04-23 DIAGNOSIS — N186 End stage renal disease: Secondary | ICD-10-CM | POA: Insufficient documentation

## 2024-04-23 DIAGNOSIS — I700 Atherosclerosis of aorta: Secondary | ICD-10-CM | POA: Diagnosis not present

## 2024-04-23 DIAGNOSIS — I6523 Occlusion and stenosis of bilateral carotid arteries: Secondary | ICD-10-CM | POA: Diagnosis not present

## 2024-04-23 DIAGNOSIS — E1165 Type 2 diabetes mellitus with hyperglycemia: Secondary | ICD-10-CM | POA: Diagnosis present

## 2024-04-23 DIAGNOSIS — R29715 NIHSS score 15: Secondary | ICD-10-CM

## 2024-04-23 DIAGNOSIS — I672 Cerebral atherosclerosis: Secondary | ICD-10-CM | POA: Diagnosis not present

## 2024-04-23 DIAGNOSIS — I63233 Cerebral infarction due to unspecified occlusion or stenosis of bilateral carotid arteries: Secondary | ICD-10-CM

## 2024-04-23 DIAGNOSIS — G9340 Encephalopathy, unspecified: Secondary | ICD-10-CM | POA: Diagnosis present

## 2024-04-23 DIAGNOSIS — E119 Type 2 diabetes mellitus without complications: Secondary | ICD-10-CM | POA: Diagnosis not present

## 2024-04-23 DIAGNOSIS — D631 Anemia in chronic kidney disease: Secondary | ICD-10-CM | POA: Diagnosis present

## 2024-04-23 DIAGNOSIS — I5023 Acute on chronic systolic (congestive) heart failure: Secondary | ICD-10-CM

## 2024-04-23 DIAGNOSIS — N2581 Secondary hyperparathyroidism of renal origin: Secondary | ICD-10-CM | POA: Diagnosis present

## 2024-04-23 DIAGNOSIS — R569 Unspecified convulsions: Secondary | ICD-10-CM | POA: Diagnosis not present

## 2024-04-23 DIAGNOSIS — Z8616 Personal history of COVID-19: Secondary | ICD-10-CM | POA: Diagnosis not present

## 2024-04-23 DIAGNOSIS — I2510 Atherosclerotic heart disease of native coronary artery without angina pectoris: Secondary | ICD-10-CM | POA: Diagnosis present

## 2024-04-23 DIAGNOSIS — G8193 Hemiplegia, unspecified affecting right nondominant side: Secondary | ICD-10-CM | POA: Diagnosis present

## 2024-04-23 DIAGNOSIS — I10 Essential (primary) hypertension: Secondary | ICD-10-CM

## 2024-04-23 DIAGNOSIS — Z8673 Personal history of transient ischemic attack (TIA), and cerebral infarction without residual deficits: Principal | ICD-10-CM | POA: Diagnosis present

## 2024-04-23 DIAGNOSIS — J4489 Other specified chronic obstructive pulmonary disease: Secondary | ICD-10-CM | POA: Diagnosis present

## 2024-04-23 DIAGNOSIS — R29700 NIHSS score 0: Secondary | ICD-10-CM | POA: Diagnosis not present

## 2024-04-23 DIAGNOSIS — Z794 Long term (current) use of insulin: Secondary | ICD-10-CM | POA: Diagnosis not present

## 2024-04-23 DIAGNOSIS — Z87891 Personal history of nicotine dependence: Secondary | ICD-10-CM

## 2024-04-23 HISTORY — PX: IR US GUIDE VASC ACCESS RIGHT: IMG2390

## 2024-04-23 HISTORY — PX: RADIOLOGY WITH ANESTHESIA: SHX6223

## 2024-04-23 HISTORY — DX: Cerebral infarction due to unspecified occlusion or stenosis of left middle cerebral artery: I63.512

## 2024-04-23 HISTORY — PX: IR PERCUTANEOUS ART THROMBECTOMY/INFUSION INTRACRANIAL INC DIAG ANGIO: IMG6087

## 2024-04-23 HISTORY — PX: IR CT HEAD LTD: IMG2386

## 2024-04-23 LAB — CBC
HCT: 37.3 % (ref 36.0–46.0)
Hemoglobin: 11.9 g/dL — ABNORMAL LOW (ref 12.0–15.0)
MCH: 29.5 pg (ref 26.0–34.0)
MCHC: 31.9 g/dL (ref 30.0–36.0)
MCV: 92.6 fL (ref 80.0–100.0)
Platelets: 196 10*3/uL (ref 150–400)
RBC: 4.03 MIL/uL (ref 3.87–5.11)
RDW: 14.4 % (ref 11.5–15.5)
WBC: 8.9 10*3/uL (ref 4.0–10.5)
nRBC: 0 % (ref 0.0–0.2)

## 2024-04-23 LAB — COMPREHENSIVE METABOLIC PANEL WITH GFR
ALT: 8 U/L (ref 0–44)
AST: 12 U/L — ABNORMAL LOW (ref 15–41)
Albumin: 4.1 g/dL (ref 3.5–5.0)
Alkaline Phosphatase: 83 U/L (ref 38–126)
Anion gap: 18 — ABNORMAL HIGH (ref 5–15)
BUN: 77 mg/dL — ABNORMAL HIGH (ref 8–23)
CO2: 20 mmol/L — ABNORMAL LOW (ref 22–32)
Calcium: 8.5 mg/dL — ABNORMAL LOW (ref 8.9–10.3)
Chloride: 97 mmol/L — ABNORMAL LOW (ref 98–111)
Creatinine, Ser: 9.95 mg/dL — ABNORMAL HIGH (ref 0.44–1.00)
GFR, Estimated: 4 mL/min — ABNORMAL LOW (ref >60)
Glucose, Bld: 202 mg/dL — ABNORMAL HIGH (ref 70–99)
Potassium: 5 mmol/L (ref 3.5–5.1)
Sodium: 135 mmol/L (ref 135–145)
Total Bilirubin: 1.5 mg/dL — ABNORMAL HIGH (ref 0.0–1.2)
Total Protein: 8.2 g/dL — ABNORMAL HIGH (ref 6.5–8.1)

## 2024-04-23 LAB — CBC WITH DIFFERENTIAL/PLATELET
Abs Immature Granulocytes: 0.05 10*3/uL (ref 0.00–0.07)
Basophils Absolute: 0.1 10*3/uL (ref 0.0–0.1)
Basophils Relative: 1 %
Eosinophils Absolute: 0.4 10*3/uL (ref 0.0–0.5)
Eosinophils Relative: 4 %
HCT: 37.6 % (ref 36.0–46.0)
Hemoglobin: 11.9 g/dL — ABNORMAL LOW (ref 12.0–15.0)
Immature Granulocytes: 1 %
Lymphocytes Relative: 15 %
Lymphs Abs: 1.3 10*3/uL (ref 0.7–4.0)
MCH: 29.5 pg (ref 26.0–34.0)
MCHC: 31.6 g/dL (ref 30.0–36.0)
MCV: 93.1 fL (ref 80.0–100.0)
Monocytes Absolute: 0.6 10*3/uL (ref 0.1–1.0)
Monocytes Relative: 7 %
Neutro Abs: 6.3 10*3/uL (ref 1.7–7.7)
Neutrophils Relative %: 72 %
Platelets: 201 10*3/uL (ref 150–400)
RBC: 4.04 MIL/uL (ref 3.87–5.11)
RDW: 14.5 % (ref 11.5–15.5)
WBC: 8.7 10*3/uL (ref 4.0–10.5)
nRBC: 0 % (ref 0.0–0.2)

## 2024-04-23 LAB — PROTIME-INR
INR: 1.3 — ABNORMAL HIGH (ref 0.8–1.2)
Prothrombin Time: 16 s — ABNORMAL HIGH (ref 11.4–15.2)

## 2024-04-23 LAB — GLUCOSE, CAPILLARY
Glucose-Capillary: 154 mg/dL — ABNORMAL HIGH (ref 70–99)
Glucose-Capillary: 85 mg/dL (ref 70–99)

## 2024-04-23 LAB — RESP PANEL BY RT-PCR (RSV, FLU A&B, COVID)  RVPGX2
Influenza A by PCR: NEGATIVE
Influenza B by PCR: NEGATIVE
Resp Syncytial Virus by PCR: NEGATIVE
SARS Coronavirus 2 by RT PCR: NEGATIVE

## 2024-04-23 LAB — ETHANOL: Alcohol, Ethyl (B): <15 mg/dL (ref <15)

## 2024-04-23 LAB — HEMOGLOBIN A1C
Hgb A1c MFr Bld: 8.6 % — ABNORMAL HIGH (ref 4.8–5.6)
Mean Plasma Glucose: 200.12 mg/dL

## 2024-04-23 LAB — APTT: aPTT: 33 s (ref 24–36)

## 2024-04-23 LAB — MRSA NEXT GEN BY PCR, NASAL: MRSA by PCR Next Gen: NOT DETECTED

## 2024-04-23 SURGERY — RADIOLOGY WITH ANESTHESIA
Anesthesia: General

## 2024-04-23 MED ORDER — CLEVIDIPINE BUTYRATE 0.5 MG/ML IV EMUL
0.0000 mg/h | INTRAVENOUS | Status: DC
  Filled 2024-04-23: qty 100

## 2024-04-23 MED ORDER — ACETAMINOPHEN 325 MG PO TABS
650.0000 mg | ORAL_TABLET | ORAL | Status: DC | PRN
  Administered 2024-04-24 – 2024-04-28 (×3): 650 mg via ORAL
  Filled 2024-04-23 (×3): qty 2

## 2024-04-23 MED ORDER — TICAGRELOR 90 MG PO TABS
ORAL_TABLET | ORAL | Status: AC
  Filled 2024-04-23: qty 2

## 2024-04-23 MED ORDER — HEPARIN SODIUM (PORCINE) 1000 UNIT/ML IJ SOLN
INTRAMUSCULAR | Status: DC | PRN
  Administered 2024-04-23: 5000 [IU] via INTRAVENOUS
  Administered 2024-04-23: 1000 [IU] via INTRAVENOUS

## 2024-04-23 MED ORDER — PHENYLEPHRINE 80 MCG/ML (10ML) SYRINGE FOR IV PUSH (FOR BLOOD PRESSURE SUPPORT)
PREFILLED_SYRINGE | INTRAVENOUS | Status: DC | PRN
  Administered 2024-04-23 (×2): 80 ug via INTRAVENOUS

## 2024-04-23 MED ORDER — METHOCARBAMOL 500 MG PO TABS
500.0000 mg | ORAL_TABLET | Freq: Once | ORAL | Status: AC
  Administered 2024-04-23: 500 mg via ORAL
  Filled 2024-04-23: qty 1

## 2024-04-23 MED ORDER — LABETALOL HCL 5 MG/ML IV SOLN
10.0000 mg | Freq: Once | INTRAVENOUS | Status: AC
  Administered 2024-04-23: 10 mg via INTRAVENOUS
  Filled 2024-04-23: qty 4

## 2024-04-23 MED ORDER — ASPIRIN 300 MG RE SUPP
300.0000 mg | Freq: Once | RECTAL | Status: AC
  Administered 2024-04-23: 300 mg via RECTAL

## 2024-04-23 MED ORDER — SODIUM CHLORIDE 0.9 % IV BOLUS: 250.0000 mL | INTRAVENOUS | Status: AC | PRN

## 2024-04-23 MED ORDER — PHENYLEPHRINE HCL-NACL 20-0.9 MG/250ML-% IV SOLN
INTRAVENOUS | Status: DC | PRN
  Administered 2024-04-23: 50 ug/min via INTRAVENOUS

## 2024-04-23 MED ORDER — LIDOCAINE HCL 1 % IJ SOLN
INTRAMUSCULAR | Status: AC
Start: 2024-04-23 — End: ?
  Filled 2024-04-23: qty 20

## 2024-04-23 MED ORDER — IOHEXOL 300 MG/ML  SOLN
150.0000 mL | Freq: Once | INTRAMUSCULAR | Status: AC | PRN
  Administered 2024-04-23: 75 mL via INTRA_ARTERIAL

## 2024-04-23 MED ORDER — LIDOCAINE HCL 1 % IJ SOLN
20.0000 mL | Freq: Once | INTRAMUSCULAR | Status: AC
  Administered 2024-04-23: 10 mL

## 2024-04-23 MED ORDER — STROKE: EARLY STAGES OF RECOVERY BOOK
Freq: Once | Status: AC
  Filled 2024-04-23: qty 1

## 2024-04-23 MED ORDER — ROCURONIUM BROMIDE 10 MG/ML (PF) SYRINGE
PREFILLED_SYRINGE | INTRAVENOUS | Status: DC | PRN
  Administered 2024-04-23: 60 mg via INTRAVENOUS

## 2024-04-23 MED ORDER — EPHEDRINE SULFATE-NACL 50-0.9 MG/10ML-% IV SOSY
PREFILLED_SYRINGE | INTRAVENOUS | Status: DC | PRN
  Administered 2024-04-23 (×2): 10 mg via INTRAVENOUS
  Administered 2024-04-23 (×2): 15 mg via INTRAVENOUS

## 2024-04-23 MED ORDER — SENNOSIDES-DOCUSATE SODIUM 8.6-50 MG PO TABS: 1.0000 | ORAL_TABLET | Freq: Every evening | ORAL | Status: DC | PRN

## 2024-04-23 MED ORDER — GLYCOPYRROLATE 0.2 MG/ML IJ SOLN
INTRAMUSCULAR | Status: DC | PRN
  Administered 2024-04-23: .2 mg via INTRAVENOUS

## 2024-04-23 MED ORDER — TICAGRELOR 60 MG PO TABS
ORAL_TABLET | ORAL | Status: AC | PRN
  Administered 2024-04-23: 180 mg via NASOGASTRIC

## 2024-04-23 MED ORDER — ORAL CARE MOUTH RINSE
15.0000 mL | OROMUCOSAL | Status: DC | PRN
  Administered 2024-04-23: 15 mL via OROMUCOSAL

## 2024-04-23 MED ORDER — SODIUM CHLORIDE 0.9 % IV BOLUS: 100.0000 mL | Freq: Once | INTRAVENOUS | Status: DC

## 2024-04-23 MED ORDER — PROPOFOL 10 MG/ML IV BOLUS
INTRAVENOUS | Status: DC | PRN
  Administered 2024-04-23: 130 mg via INTRAVENOUS

## 2024-04-23 MED ORDER — LIDOCAINE 5 % EX PTCH
1.0000 | MEDICATED_PATCH | CUTANEOUS | Status: DC
  Administered 2024-04-23: 1 via TRANSDERMAL
  Filled 2024-04-23: qty 1

## 2024-04-23 MED ORDER — ACETAMINOPHEN 160 MG/5ML PO SOLN: 650.0000 mg | ORAL | Status: DC | PRN

## 2024-04-23 MED ORDER — INSULIN ASPART 100 UNIT/ML IJ SOLN
0.0000 [IU] | INTRAMUSCULAR | Status: DC
  Administered 2024-04-23: 3 [IU] via SUBCUTANEOUS

## 2024-04-23 MED ORDER — FENTANYL CITRATE (PF) 250 MCG/5ML IJ SOLN
INTRAMUSCULAR | Status: DC | PRN
  Administered 2024-04-23: 100 ug via INTRAVENOUS

## 2024-04-23 MED ORDER — IOHEXOL 350 MG/ML SOLN
50.0000 mL | Freq: Once | INTRAVENOUS | Status: AC | PRN
  Administered 2024-04-23: 50 mL via INTRAVENOUS

## 2024-04-23 MED ORDER — ACETAMINOPHEN 650 MG RE SUPP: 650.0000 mg | RECTAL | Status: DC | PRN

## 2024-04-23 MED ORDER — SUCCINYLCHOLINE CHLORIDE 200 MG/10ML IV SOSY
PREFILLED_SYRINGE | INTRAVENOUS | Status: DC | PRN
  Administered 2024-04-23: 160 mg via INTRAVENOUS

## 2024-04-23 MED ORDER — ONDANSETRON HCL 4 MG/2ML IJ SOLN
INTRAMUSCULAR | Status: DC | PRN
  Administered 2024-04-23: 4 mg via INTRAVENOUS

## 2024-04-23 MED ORDER — FENTANYL CITRATE (PF) 100 MCG/2ML IJ SOLN
INTRAMUSCULAR | Status: AC
  Filled 2024-04-23: qty 2

## 2024-04-23 MED ORDER — SUGAMMADEX SODIUM 200 MG/2ML IV SOLN
INTRAVENOUS | Status: DC | PRN
  Administered 2024-04-23: 300 mg via INTRAVENOUS
  Administered 2024-04-23: 100 mg via INTRAVENOUS
  Administered 2024-04-23: 200 mg via INTRAVENOUS

## 2024-04-23 MED ORDER — ACETAMINOPHEN 500 MG PO TABS
1000.0000 mg | ORAL_TABLET | Freq: Once | ORAL | Status: AC
  Administered 2024-04-23: 1000 mg via ORAL
  Filled 2024-04-23: qty 2

## 2024-04-23 MED ORDER — SODIUM CHLORIDE 0.9 % IV SOLN: INTRAVENOUS | Status: DC

## 2024-04-23 MED ORDER — IOHEXOL 350 MG/ML SOLN
100.0000 mL | Freq: Once | INTRAVENOUS | Status: AC | PRN
  Administered 2024-04-23: 100 mL via INTRAVENOUS

## 2024-04-23 MED ORDER — SODIUM CHLORIDE 0.9 % IV SOLN: INTRAVENOUS | Status: DC | PRN

## 2024-04-23 MED ORDER — HEPARIN (PORCINE) 25000 UT/250ML-% IV SOLN: 5000.0000 [IU]/h | INTRAVENOUS | Status: DC

## 2024-04-23 MED ORDER — HEPARIN (PORCINE) 25000 UT/250ML-% IV SOLN
500.0000 [IU]/h | INTRAVENOUS | Status: AC
  Administered 2024-04-23: 500 [IU]/h via INTRAVENOUS

## 2024-04-23 NOTE — Progress Notes (Signed)
 SLP Cancellation Note  Patient Details Name: Terry Arroyo MRN: [id number] DOB: 07-02-1961   Cancelled treatment:       Reason Eval/Treat Not Completed: Patient not medically ready (on vent s/p IR procedure). SLP will continue following.    Amil Kale, M.A., CCC-SLP Speech Language Pathology, Acute Rehabilitation Services  Secure Chat preferred [phone]  04/23/2024, 4:36 PM

## 2024-04-23 NOTE — Procedures (Signed)
 NIR Procedure Note  Preop Dx: Acute ischemic stroke Post Dx: Acute ischemic stroke  Procedure: Left internal carotid stent placement Operator: Reagan Camera MD  EBL: 100 mL Complications: No immediate  Findings: Severe focal stenosis in the proximal left ICA TICI Score: 3  Length of Bedrest: 2 hours from NIR standpoint BP goal next 24 hours: SBP 120-160  Sheath Closure: Angioseal Disposition: To recovery in stable condition.  Please call with questions, concerns, or change in patient condition  Reagan Camera MD

## 2024-04-23 NOTE — Progress Notes (Signed)
 Pt came up from IR intubated. Pt placed on the vent with the current vent settings.

## 2024-04-23 NOTE — Progress Notes (Signed)
 Dr. Alecia Ames notified that patient passed bedside swallow screen.

## 2024-04-23 NOTE — Consult Note (Signed)
 NIR Brief Note:  Case discussed with Dr. Doretta Gant in detail.  Briefly, patient is being transferred emergently from [facility] for endovascular stroke treatment.  At the time of this note, multiple attempts at contacting family have been made without success and the patient is not able to provide informed consent.  Dr. Doretta Gant and I both agree that the patient is an appropriate candidate for endovascular therapy and thus we are both documenting emergent 2-physician consent.

## 2024-04-23 NOTE — Progress Notes (Signed)
   04/23/24 1145  Spiritual Encounters  Type of Visit Initial  Care provided to: Patient  Conversation partners present during encounter Nurse  Reason for visit Routine spiritual support  OnCall Visit No   Chaplain visited patient due to Code Stroke and was told patient was in CT.  Chaplain checked with staff to see if family was present and there wasn't any family that they were aware of.  Chaplain left and returned to see if patient was back.  Staff shared that patient was in MRI so, Chaplain attempted to see patient before discharge but was unsuccessful.    Rev. Rana M. Nolon Baxter, M.Div.  Chaplain Resident [facility]

## 2024-04-23 NOTE — ED Triage Notes (Signed)
 Pt arrives via EMS from [facility] dyalysis. Pt has been having right sided back pain that has been happening for the last two weeks. Pt sts that she has had no known trauma that could of caused the pain.

## 2024-04-23 NOTE — Code Documentation (Signed)
 Stroke Response Nurse Documentation Code Documentation  CHAZLYN OLMEDO is a 63 y.o. female arriving to [facility] via [facility] on 04/23/2024 with past medical hx of HTN, left eye blind, HLD, CAD, anterior cerebral aneurysm, CABG, MI, stroke, sleep apnea, COPD, DVT, PVD, aortic atherosclerosis, CHF, uses walker. On aspirin  81 mg daily and No antithrombotic. Code stroke was activated by ED.   Patient from dialysis clinic where she was LKW at 2200 5/4 before bed and now complaining of right sided weakness.  Patient arrived reporting back pain. She went to bed normal last night and woke up this morning with right sided weakness. She was able to get on bus for dialysis but noticed she was feeling funny. Right sided weakness noted in ED and code stroke was activated.   Stroke team at the bedside on patient arrival. Labs drawn and patient cleared for CT by Dr. Synetta Eves. Patient to CT with team. NIHSS 16, see documentation for details and code stroke times. Patient with disoriented, right gaze preference , right hemianopia, right facial droop, bilateral arm weakness, right leg weakness, right decreased sensation, dysarthria , and Sensory  neglect on exam. The following imaging was completed:  CT Head, CTA, and CTP. Patient is not a candidate for IV Thrombolytic due to outside window, per MD. Patient is a candidate for IR.   Care Plan: transfer to [facility] for IR via [facility]. Belongings with EMS in patient belonging bag.  Process Delays Noted: delay obtaining 18 IV access for perfusion scan  Bedside handoff with ED RN Thersa Flavors, [facility] SRN given report via phone prior to transport.  Gareld June  Stroke Response RN

## 2024-04-23 NOTE — Transfer of Care (Signed)
 Immediate Anesthesia Transfer of Care Note  Patient: Terry Arroyo  Procedure(s) Performed: RADIOLOGY WITH ANESTHESIA  Patient Location: PACU and NICU  Anesthesia Type:General  Level of Consciousness: awake, oriented, patient cooperative, and Patient remains intubated per anesthesia plan  Airway & Oxygen Therapy: Patient Spontanous Breathing and Patient remains intubated per anesthesia plan  Post-op Assessment: Report given to RN and Post -op Vital signs reviewed and stable  Post vital signs: Reviewed and stable  Last Vitals:  Vitals Value Taken Time  BP 154/69 04/23/24 1601  Temp    Pulse 65 04/23/24 1610  Resp 11 04/23/24 1610  SpO2 100 % 04/23/24 1610  Vitals shown include unfiled device data.  Last Pain: There were no vitals filed for this visit.       Complications: No notable events documented.

## 2024-04-23 NOTE — ED Provider Notes (Signed)
 [facility] Provider Note    Event Date/Time   First MD Initiated Contact with Patient 04/23/24 1124     (approximate)   History   Back Pain   HPI  Terry Arroyo is a 63 year old female presenting to the emergency department for evaluation with initial complaint of right sided back pain.  Reported that over the past 2 weeks she has been having ongoing right sided back pain, intermittent.  However, during my evaluation in obtaining additional history, patient tells me that she has new weakness of her right side today.  Initially told me that she felt at her baseline at 7 AM, was on a bus around 9 AM and did not feel good.  When she arrived at dialysis she had shaking of her leg and weakness when attempting to stand.  Was transferred to the ER for further evaluation.  She did not receive dialysis today.  Now has weakness of her right arm and leg.  Additionally reports chest pain that is new today.  Reports her back pain is in her right upper back.    Physical Exam   Triage Vital Signs: ED Triage Vitals  Encounter Vitals Group     BP 04/23/24 1106 (!) 159/75     Systolic BP Percentile --      Diastolic BP Percentile --      Pulse Rate 04/23/24 1106 (!) 59     Resp 04/23/24 1108 18     Temp 04/23/24 1108 98.6 F (37 C)     Temp Source 04/23/24 1108 Oral     SpO2 04/23/24 1106 96 %     Weight 04/23/24 1105 162 lb (73.5 kg)     Height 04/23/24 1105 5\' 6"  (1.676 m)     Head Circumference --      Peak Flow --      Pain Score 04/23/24 1104 6     Pain Loc --      Pain Education --      Exclude from Growth Chart --     Most recent vital signs: Vitals:   04/23/24 1230 04/23/24 1307  BP: 127/65 123/63  Pulse: (!) 50 (!) 52  Resp: 17   Temp:  98.5 F (36.9 C)  SpO2: 94% 99%     General: Awake, somewhat somnolent but interactive CV:  Regular rate, good peripheral perfusion.  Resp:  Unlabored respirations, lungs clear to  auscultation Abd:  Nondistended, soft, nontender Neuro:  Keenly aware, incorrectly reports her age is 61, incorrectly reports the month is April, able to blink eyes and squeeze hands, normal extraocular movements, no visual field loss, questionable mild right facial droop, difficulty holding right lower extremity antigravity with rapid drop to the ground, right upper extremity drifts and touch the bed.  No drift of left upper and lower extremity.  Subjective diminished sensation over the right upper and lower extremity.  No aphasia.  Some dysarthria noted.   ED Results / Procedures / Treatments   Labs (all labs ordered are listed, but only abnormal results are displayed) Labs Reviewed  COMPREHENSIVE METABOLIC PANEL WITH GFR - Abnormal; Notable for the following components:      Result Value   Chloride 97 (*)    CO2 20 (*)    Glucose, Bld 202 (*)    BUN 77 (*)    Creatinine, Ser 9.95 (*)    Calcium  8.5 (*)    Total Protein 8.2 (*)    AST 12 (*)  Total Bilirubin 1.5 (*)    GFR, Estimated 4 (*)    Anion gap 18 (*)    All other components within normal limits  CBC - Abnormal; Notable for the following components:   Hemoglobin 11.9 (*)    All other components within normal limits  PROTIME-INR - Abnormal; Notable for the following components:   Prothrombin Time 16.0 (*)    INR 1.3 (*)    All other components within normal limits  CBC WITH DIFFERENTIAL/PLATELET - Abnormal; Notable for the following components:   Hemoglobin 11.9 (*)    All other components within normal limits  RESP PANEL BY RT-PCR (RSV, FLU A&B, COVID)  RVPGX2  APTT  ETHANOL  URINALYSIS, ROUTINE W REFLEX MICROSCOPIC  URINE DRUG SCREEN, QUALITATIVE ([facility] ONLY)     EKG EKG independently reviewed interpreted by myself (ER attending) demonstrates:  EKG demonstrates sinus rhythm rate of 59, PR 154, QRS 113, QTc 486  RADIOLOGY Imaging independently reviewed and interpreted by myself demonstrates:  CT noncontrast  head without acute bleed CT dissection protocol without aortic dissection CTA head and neck without LVO, but does demonstrate significant stenosis with string sign CT perfusion study without penumbra  Formal Radiology Read:  CT CEREBRAL PERFUSION W CONTRAST Result Date: 04/23/2024 CLINICAL DATA:  63 year old female code stroke presentation, profound unilateral lower extremity weakness. EXAM: CT PERFUSION BRAIN TECHNIQUE: Multiphase CT imaging of the brain was performed following IV bolus contrast injection. Subsequent parametric perfusion maps were calculated using RAPID software. RADIATION DOSE REDUCTION: This exam was performed according to the departmental dose-optimization program which includes automated exposure control, adjustment of the mA and/or kV according to patient size and/or use of iterative reconstruction technique. CONTRAST:  50mL OMNIPAQUE  IOHEXOL  350 MG/ML SOLN COMPARISON:  CTA 1207 hours today. FINDINGS: FINDINGS ASPECTS: 10 CBF (<30%) Volume: 0mL. No CBF ir CBV parameter abnormality detected. Perfusion (Tmax>6.0s) volume: 0mL however, 88 mL of T-max greater than 4s is detected which is mostly (although not entirely) in the left hemisphere. Mismatch Volume: Not applicablemL Infarction Location:Not applicable IMPRESSION: 1. NO infarct core or penumbra detected by standard CTP parameters. 2. Suggestion of asymmetric oligemia in the left hemisphere (T-max > 4s), which might relate to the severe ICA stenoses described on CTA today. Electronically Signed   By: Marlise Simpers M.D.   On: 04/23/2024 13:04   CT Angio Chest/Abd/Pel for Dissection W and/or W/WO Result Date: 04/23/2024 CLINICAL DATA:  Weakness and back pain. Acute aortic syndrome suspected. EXAM: CT ANGIOGRAPHY CHEST, ABDOMEN AND PELVIS TECHNIQUE: Non-contrast CT of the chest was initially obtained. Multidetector CT imaging through the chest, abdomen and pelvis was performed using the standard protocol during bolus administration of  intravenous contrast. Multiplanar reconstructed images and MIPs were obtained and reviewed to evaluate the vascular anatomy. RADIATION DOSE REDUCTION: This exam was performed according to the departmental dose-optimization program which includes automated exposure control, adjustment of the mA and/or kV according to patient size and/or use of iterative reconstruction technique. CONTRAST:  OMNIPAQUE  IOHEXOL  350 MG/ML SOLN COMPARISON:  CT abdomen pelvis dated 02/09/2022. FINDINGS: Evaluation is limited due to streak artifact caused by patient's arms and overlying support wires. CTA CHEST FINDINGS Cardiovascular: Mild cardiomegaly. No pericardial effusion. There is mild atherosclerotic calcification of the thoracic aorta. No aneurysmal dilatation or dissection. The origins of the great vessels of the aortic arch appear patent. No pulmonary artery embolus identified. Mediastinum/Nodes: No hilar or mediastinal adenopathy. There is circumferential thickening of the mid esophagus concerning for esophagitis. Clinical  correlation is recommended. An infiltrative process is not excluded. No mediastinal fluid collection. Lungs/Pleura: There are minimal bibasilar linear atelectasis. No focal consolidation, pleural effusion, or pneumothorax. The central airways are patent. Musculoskeletal: Median sternotomy wires. No acute osseous pathology. Review of the MIP images confirms the above findings. CTA ABDOMEN AND PELVIS FINDINGS VASCULAR Aorta: Advanced atherosclerotic calcification of the abdominal aorta. No aneurysmal dilatation or dissection. No periaortic fluid collection. Celiac: Atherosclerotic calcification of the origin of the celiac artery. The celiac artery and its major branches are patent. SMA: The SMA is patent. Renals: There is atherosclerotic calcification of the origins of the renal arteries with luminal narrowing. There is diminished flow in the renal arteries bilaterally. The renal arteries however remain  patent. IMA: Atherosclerotic calcification of the IMA with diminished flow. Focal area of high-grade narrowing of the proximal IMA with reconstitution of flow distal to this segment. Inflow: Advanced atherosclerotic calcification of the iliac arteries. The iliac arteries are patent. Veins: The IVC is unremarkable.  No portal venous gas. Review of the MIP images confirms the above findings. NON-VASCULAR No intra-abdominal free air.  Small perihepatic ascites. Hepatobiliary: The liver is unremarkable. No biliary dilatation. The gallbladder is unremarkable. Pancreas: Unremarkable. No pancreatic ductal dilatation or surrounding inflammatory changes. Spleen: Normal in size without focal abnormality. Adrenals/Urinary Tract: The adrenal glands are unremarkable. Moderate bilateral renal parenchyma atrophy. No hydronephrosis. There is stranding of the perinephric and renal sinus fat. Correlation with urinalysis recommended to exclude UTI. The urinary bladder is collapsed. Stomach/Bowel: Mild sigmoid diverticulosis. There is no bowel obstruction or active inflammation. The appendix is normal. Lymphatic: No adenopathy. Reproductive: The uterus is grossly unremarkable. No suspicious adnexal masses. Other: None Musculoskeletal: No acute or significant osseous findings. Review of the MIP images confirms the above findings. IMPRESSION: 1. No aortic aneurysm or dissection. 2. Circumferential thickening of the mid esophagus concerning for esophagitis. Clinical correlation is recommended. 3. Mild sigmoid diverticulosis. No bowel obstruction. Normal appendix. 4. Moderate bilateral renal parenchyma atrophy. No hydronephrosis. Correlation with urinalysis recommended to exclude UTI. Electronically Signed   By: Angus Bark M.D.   On: 04/23/2024 12:44   CT ANGIO HEAD NECK W WO CM (CODE STROKE) Result Date: 04/23/2024 CLINICAL DATA:  63 year old female code stroke presentation. Possible left MCA syndrome. Dialysis patient. EXAM: CT  ANGIOGRAPHY HEAD AND NECK WITH AND WITHOUT CONTRAST TECHNIQUE: Multidetector CT imaging of the head and neck was performed using the standard protocol during bolus administration of intravenous contrast. Multiplanar CT image reconstructions and MIPs were obtained to evaluate the vascular anatomy. Carotid stenosis measurements (when applicable) are obtained utilizing NASCET criteria, using the distal internal carotid diameter as the denominator. RADIATION DOSE REDUCTION: This exam was performed according to the departmental dose-optimization program which includes automated exposure control, adjustment of the mA and/or kV according to patient size and/or use of iterative reconstruction technique. CONTRAST:  50mL OMNIPAQUE  IOHEXOL  350 MG/ML SOLN COMPARISON:  Head CT 1158 hours today. FINDINGS: CTA NECK Skeleton: Reversal of the normal cervical lordosis. Previous sternotomy. Mostly absent dentition. Upper thoracic spina bifida occulta. No acute osseous abnormality identified. Upper chest: Previous CABG. Other neck: Partially retropharyngeal carotids. Nonvascular neck soft tissue spaces are within normal limits. Aortic arch: Calcified aortic atherosclerosis. Bovine arch configuration. Right carotid system: Brachiocephalic artery and proximal right CCA are patent with mild plaque and no stenosis. Tortuous, partially retropharyngeal right CCA. Calcified plaque at the right ICA origin without significant stenosis. Soft and calcified plaque at the bulb or just distal to  the bulb with high-grade stenosis (series 2, image 94 and series 6, image 68) approaching a radiographic string sign, numerically estimated at 70-80 % with respect to the distal vessel. The vessel remains patent to the skull base. Left carotid system: Left CCA origin plaque with less than 50% stenosis. Tortuous left CCA. Soft and calcified plaque before the bifurcation. Severe plaque at the left carotid bifurcation with radiographic string sign stenosis  series 5, image 133. But the vessel remains patent. Vertebral arteries: Right subclavian origin calcified plaque without stenosis. Diminutive and atherosclerotic right vertebral artery origin and V1 segment with at least moderate stenosis. Intermittent right V2 segment plaque, but the right vertebral artery caliber improves to the skull base with no additional significant stenosis. Left subclavian origin plaque without stenosis. Calcified plaque at the left vertebral artery origin with similar mild to moderate stenosis. Codominant left vertebral artery with intermittent V2 and V3 segment plaque but no other significant stenosis to the skull base. CTA HEAD Posterior circulation: Atherosclerotic distal vertebral arteries and vertebrobasilar junction. Dominant left V4. PICA origins remain patent. Mild distal vertebral and vertebrobasilar stenosis. Patent basilar artery without stenosis. Patent SCA and PCA origins. Small posterior communicating arteries. Bilateral PCA branches are within normal limits. Anterior circulation:  Both ICA siphons are patent. Severe calcified atherosclerosis and stenosis of the cavernous left ICA on series 6, image 102, string sign stenosis. Moderate additional left supraclinoid plaque. The vessel remains patent. Left posterior communicating artery origin is normal. Similar severe calcified right siphon plaque and stenosis. Distal right ICA remains patent with moderate additional supraclinoid plaque and stenosis. Patent carotid termini, MCA and ACA origins. Normal anterior communicating artery. Bilateral ACA branches are within normal limits. Right MCA M1 segment and bifurcation are patent without stenosis. No right MCA branch occlusion is identified. Left MCA M1 segment is patent without stenosis. There is a saccular 2-3 mm left MCA bifurcation aneurysm on series 6, image 112, directed anteriorly and laterally. Left MCA bifurcation remains patent. No left MCA branch occlusion is identified.  Venous sinuses: Not evaluated due to early contrast timing. Anatomic variants: Dominant left vertebral artery. Review of the MIP images confirms the above findings IMPRESSION: 1. Negative for large vessel occlusion, But Positive for VERY SEVERE underlying atherosclerosis with: - LICA origin and bilateral ICA siphon RADIOGRAPHIC STRING SIGN STENOSES. - Right ICA distal bulb high-grade stenosis, estimated 70-80% and approaching a radiographic string sign. 2. 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 3. Less pronounced posterior circulation, bilateral vertebral artery atherosclerosis but still evidence of up to moderate bilateral vertebral artery stenoses. 4.  Aortic Atherosclerosis (ICD10-I70.0). Salient findings discussed by telephone with Dr. Doretta Gant on 04/23/2024 at 12:25 . Electronically Signed   By: Marlise Simpers M.D.   On: 04/23/2024 12:27   CT HEAD CODE STROKE WO CONTRAST Result Date: 04/23/2024 CLINICAL DATA:  Code stroke. 63 year old female. Dialysis patient. Pain. EXAM: CT HEAD WITHOUT CONTRAST TECHNIQUE: Contiguous axial images were obtained from the base of the skull through the vertex without intravenous contrast. RADIATION DOSE REDUCTION: This exam was performed according to the departmental dose-optimization program which includes automated exposure control, adjustment of the mA and/or kV according to patient size and/or use of iterative reconstruction technique. COMPARISON:  Head CT,Brain MRI 2024-01-04. FINDINGS: Brain: Pronounced chronic dural calcification, stable. Stable cerebral volume. No midline shift, ventriculomegaly, mass effect, evidence of mass lesion, intracranial hemorrhage or evidence of cortically based acute infarction. Stable gray-white matter differentiation throughout the brain, relatively normal  for age. Vascular: Chronic severe calcification. Skull: Stable visualized osseous structures. Sinuses/Orbits: Severe chronic paranasal sinusitis with mucoperiosteal  thickening, chronically eroded posterior septum and sphenoid septum. Stable appearance since December. Tympanic cavities and mastoids remain well aerated. Other: No acute orbit or scalp soft tissue finding. ASPECTS [facility] Stroke Program Early CT Score) Total score (0-10 with 10 being normal): 10 IMPRESSION: 1. Stable noncontrast CT appearance of the brain since December, remarkable for unusually advanced dural calcification. ASPECTS 10. No acute intracranial hemorrhage. 2. Severe chronic paranasal sinus disease. 3. These results were communicated to Dr. Doretta Gant at 12:11 pm on 04/23/2024 by text page via the [facility] messaging system. Electronically Signed   By: Marlise Simpers M.D.   On: 04/23/2024 12:12    PROCEDURES:  Critical Care performed: Yes, see critical care procedure note(s)  CRITICAL CARE Performed by: Claria Crofts   Total critical care time: 40 minutes  Critical care time was exclusive of separately billable procedures and treating other patients.  Critical care was necessary to treat or prevent imminent or life-threatening deterioration.  Critical care was time spent personally by me on the following activities: development of treatment plan with patient and/or surrogate as well as nursing, discussions with consultants, evaluation of patient's response to treatment, examination of patient, obtaining history from patient or surrogate, ordering and performing treatments and interventions, ordering and review of laboratory studies, ordering and review of radiographic studies, pulse oximetry and re-evaluation of patient's condition.    Aaron AasUltrasound ED Peripheral IV (Provider)  Date/Time: 04/23/2024 3:39 PM  Performed by: Claria Crofts, MD Authorized by: Claria Crofts, MD   Procedure details:    Indications: multiple failed IV attempts     Skin Prep: chlorhexidine  gluconate     Location: left upper arm.   Angiocath:  20 G   Bedside Ultrasound Guided: Yes     Images: not archived     Patient tolerated  procedure without complications: Yes     Dressing applied: Yes   .Ultrasound ED Peripheral IV (Provider)  Date/Time: 04/23/2024 3:40 PM  Performed by: Claria Crofts, MD Authorized by: Claria Crofts, MD   Procedure details:    Indications: multiple failed IV attempts     Skin Prep: chlorhexidine  gluconate     Location: left upper arm.   Angiocath:  18 G   Bedside Ultrasound Guided: Yes     Images: not archived     Patient tolerated procedure without complications: Yes     Dressing applied: Yes      MEDICATIONS ORDERED IN ED: Medications  acetaminophen  (TYLENOL ) tablet 1,000 mg (1,000 mg Oral Given 04/23/24 1131)  methocarbamol  (ROBAXIN ) tablet 500 mg (500 mg Oral Given 04/23/24 1131)  iohexol  (OMNIPAQUE ) 350 MG/ML injection 100 mL (100 mLs Intravenous Contrast Given 04/23/24 1216)  iohexol  (OMNIPAQUE ) 350 MG/ML injection 50 mL (50 mLs Intravenous Contrast Given 04/23/24 1214)  labetalol  (NORMODYNE ) injection 10 mg (10 mg Intravenous Given 04/23/24 1221)  iohexol  (OMNIPAQUE ) 350 MG/ML injection 50 mL (50 mLs Intravenous Contrast Given 04/23/24 1250)  aspirin  suppository 300 mg (300 mg Rectal Given 04/23/24 1313)     IMPRESSION / MDM / ASSESSMENT AND PLAN / ED COURSE  I reviewed the triage vital signs and the nursing notes.  Differential diagnosis includes, but is not limited to, acute CVA, intracranial bleed, aortic dissection, electrolyte abnormality  Patient's presentation is most consistent with acute presentation with potential threat to life or bodily function.  63 year old female who initially presented with complaint of upper back pain found  to have deficits concerning for acute stroke on my evaluation.  Ordered for multimodal pain control from triage, these were given just prior to my initial evaluation.  Patient was found to have right-sided weakness concerning for acute CVA.  Initial difficulty obtaining IV access, I did place a 20-gauge in her left upper arm. Initial last known well around 9  AM, code stroke was activated.  In obtaining further history from patient she reports that she actually did have some mild weakness when waking up, thus outside thrombolytic window.  CTA was without obvious LVO, but did demonstrate significant stenosis for which perfusion study was recommended and a ultrasound guided 18-gauge IV was placed.  Case was discussed with neurology here who reviewed the case with IR at [facility] and did recommend initiation of code IR.  Patient was transferred for further management.     FINAL CLINICAL IMPRESSION(S) / ED DIAGNOSES   Final diagnoses:  Cerebrovascular accident (CVA), unspecified mechanism (HCC)  Upper back pain on right side     Rx / DC Orders   ED Discharge Orders     None        Note:  This document was prepared using Dragon voice recognition software and may include unintentional dictation errors.   Claria Crofts, MD 04/23/24 [phone]

## 2024-04-23 NOTE — H&P (Signed)
 NEUROLOGY H&P NOTE   Date of service: Apr 23, 2024 Patient Name: Terry Arroyo MRN:  [id number] DOB:  Jul 15, 1961 Chief Complaint: "R sided weakness"  History of Present Illness  Terry Arroyo is a 63 y.o. female with hx of ESRD on HD (MWF), cerebral aneurysm, DM2, HTN, HLD, post-op DVT no longer on [facility], CAD status post CABG, CHFrEF with last EF 35 to 40% December 2024, pituitary adenoma s/p resection who presented to [facility] ED 5/5 AM complaining of back pain and right sided weakness.  Patient stated that she woke up with mild right arm weakness which is continued to get worse throughout the day.  She was able to get on the bus for dialysis and then ambulate into the dialysis appointment with her walker, but was dragging her right foot.  Dialysis providers called EMS. On [place] exam, NIH 14.  CT head negative.  She was outside of the window for TNK.  CTA was negative for LVO but positive for severe atherosclerosis particularly with left ICA origin and bilateral ICA siphon string sign as well as right ICA distal bulb high-grade stenosis.   Due to her aphasia, patent was unable to consent for procedure.  After multiple attempts to get in touch with family per [facility] neurology consult notes, emergent 2-physician consent was completed with Dr. Doretta Gant and Dr. Laverta Potters.  Patient was transferred emergently to [facility] for IR procedure. She was given 300mg  PR ASA before transfer.   On exam at [facility] ED bridge, patient was lethargic, oriented to name only, follows simple commands, dense right hemiparesis, decreased sensation on right.   Last known well: prior to going to sleep last night Modified rankin score: 3-Moderate disability-requires help but walks WITHOUT assistance IV Thrombolysis: No, outside of window Thrombectomy: Yes   NIHSS components Score: Comment  1a Level of Conscious 0[]  1[]  2[x]  3[]      1b LOC Questions 0[]  1[]  2[x]       1c LOC Commands 0[x]  1[]  2[]       2 Best Gaze 0[x]  1[]  2[]        3 Visual 0[]  1[x]  2[]  3[]     Blind in L eye at baseline  4 Facial Palsy 0[]  1[x]  2[]  3[]      5a Motor Arm - left 0[x]  1[]  2[]  3[]  4[]  UN[]    5b Motor Arm - Right 0[]  1[]  2[]  3[x]  4[]  UN[]    6a Motor Leg - Left 0[x]  1[]  2[]  3[]  4[]  UN[]    6b Motor Leg - Right 0[]  1[]  2[]  3[x]  4[]  UN[]    7 Limb Ataxia 0[x]  1[]  2[]  UN[]      8 Sensory 0[]  1[x]  2[]  UN[]      9 Best Language 0[]  1[x]  2[]  3[]      10 Dysarthria 0[]  1[x]  2[]  UN[]      11 Extinct. and Inattention 0[x]  1[]  2[]       TOTAL:   15      ROS   Unable to perform due to AMS and decreased LOC  Past History   Past Medical History:  Diagnosis Date   Anemia    Angina at rest [facility])    Angioedema 03/20/2021   Lisinopril    Anterior cerebral aneurysm    approximately 5 mm; left MCA    Anxiety    Aortic atherosclerosis (HCC)    Asthma    Blind left eye    CHF (congestive heart failure) (HCC)    COPD (chronic obstructive pulmonary disease) (HCC)    Coronary artery disease  Depression    DOE (dyspnea on exertion)    ESRD (end stage renal disease) (HCC)    Gait instability    Genital herpes    GERD (gastroesophageal reflux disease)    HFrEF (heart failure with reduced ejection fraction) (HCC)    History of 2019 novel coronavirus disease (COVID-19) 03/2019   [facility]    History of DVT (deep vein thrombosis)    History of DVT (deep vein thrombosis)    Hx of CABG 2018   x 4; performed in [place]    Hyperlipidemia    Hypertension    MI (myocardial infarction) (HCC) 2018   no stents   Pituitary adenoma (HCC)    PVD (posterior vitreous detachment), right eye    Secondary hyperparathyroidism (HCC)    Sleep apnea    non-compliant with nocturnal PAP therapy   Stroke [facility]) 2014   stated affected left side.   T2DM (type 2 diabetes mellitus) [facility])    Past Surgical History:  Procedure Laterality Date   A/V FISTULAGRAM Right 12/29/2021   Procedure: A/V FISTULAGRAM;  Surgeon: Jackquelyn Mass, MD;   Location: [facility] INVASIVE CV LAB;  Service: Cardiovascular;  Laterality: Right;   A/V FISTULAGRAM Right 06/15/2022   Procedure: A/V Fistulagram;  Surgeon: Jackquelyn Mass, MD;  Location: [facility] INVASIVE CV LAB;  Service: Cardiovascular;  Laterality: Right;   A/V FISTULAGRAM Right 04/17/2024   Procedure: A/V Fistulagram;  Surgeon: Jackquelyn Mass, MD;  Location: [facility] INVASIVE CV LAB;  Service: Cardiovascular;  Laterality: Right;   AV FISTULA PLACEMENT Right 01/28/2021   Procedure: ARTERIOVENOUS (AV) FISTULA CREATION;  Surgeon: Jackquelyn Mass, MD;  Location: [facility] ORS;  Service: Vascular;  Laterality: Right;   AV FISTULA PLACEMENT Right 05/13/2021   Procedure: INSERTION OF ARTERIOVENOUS (AV) GORE-TEX GRAFT ARM ( BRACHIAL AXILLARY );  Surgeon: Jackquelyn Mass, MD;  Location: [facility] ORS;  Service: Vascular;  Laterality: Right;   BRAIN SURGERY  1986   pituitary tumor   BREAST EXCISIONAL BIOPSY Left yrs ago   benign   CESAREAN SECTION     COLONOSCOPY N/A 05/26/2023   Procedure: COLONOSCOPY;  Surgeon: Quintin Buckle, DO;  Location: [facility] ENDOSCOPY;  Service: Gastroenterology;  Laterality: N/A;  DIALYSIS   CORONARY ARTERY BYPASS GRAFT  2018   x 4 vessels; performed in [place]    DIALYSIS/PERMA CATHETER INSERTION N/A 11/07/2020   Procedure: DIALYSIS/PERMA CATHETER INSERTION;  Surgeon: Celso College, MD;  Location: [facility] INVASIVE CV LAB;  Service: Cardiovascular;  Laterality: N/A;   DIALYSIS/PERMA CATHETER REMOVAL N/A 06/12/2021   Procedure: DIALYSIS/PERMA CATHETER REMOVAL;  Surgeon: Celso College, MD;  Location: [facility] INVASIVE CV LAB;  Service: Cardiovascular;  Laterality: N/A;   ESOPHAGOGASTRODUODENOSCOPY (EGD) WITH PROPOFOL  N/A 12/22/2021   Procedure: ESOPHAGOGASTRODUODENOSCOPY (EGD) WITH PROPOFOL ;  Surgeon: Shane Darling, MD;  Location: [facility] ENDOSCOPY;  Service: Gastroenterology;  Laterality: N/A;  IDDM   EYE SURGERY Right 2021   cataracts    PERIPHERAL VASCULAR THROMBECTOMY Right 04/03/2021    Procedure: A/V Fistulagram ;  Surgeon: Jackquelyn Mass, MD;  Location: [facility] INVASIVE CV LAB;  Service: Cardiovascular;  Laterality: Right;   Family History  Problem Relation Age of Onset   CAD Mother    CAD Brother    Breast cancer Sister 80   Social History   Socioeconomic History   Marital status: Single    Spouse name: Not on file   Number of children: Not on file   Years of education:  Not on file   Highest education level: Not on file  Occupational History    Comment: disabled  Tobacco Use   Smoking status: Former    Current packs/day: 0.00    Types: Cigarettes    Quit date: 2018    Years since quitting: 7.3   Smokeless tobacco: Never  Vaping Use   Vaping status: Never Used  Substance and Sexual Activity   Alcohol  use: Not Currently   Drug use: Not Currently   Sexual activity: Not Currently  Other Topics Concern   Not on file  Social History Narrative   Patient lives alone.  Feels safe in her home.   Social Drivers of Corporate investment banker Strain: Low Risk  (10/13/2023)   Received from [facility]   Overall Financial Resource Strain (CARDIA)    Difficulty of Paying Living Expenses: Not very hard  Food Insecurity: No Food Insecurity (10/13/2023)   Received from [facility]   Hunger Vital Sign    Worried About Running Out of Food in the Last Year: Never true    Ran Out of Food in the Last Year: Never true  Transportation Needs: No Transportation Needs (10/13/2023)   Received from [facility] - Transportation    In the past 12 months, has lack of transportation kept you from medical appointments or from getting medications?: No    Lack of Transportation (Non-Medical): No  Physical Activity: Not on file  Stress: Not on file  Social Connections: Not on file   Allergies  Allergen Reactions   Hydralazine  Hives, Shortness Of Breath, Swelling and Rash    Body aches    Kiwi Extract  Hives, Swelling and Rash    Mouth swells    Lisinopril  Swelling    Angioedema   Shellfish Allergy Anaphylaxis    Shrimp/lobster  Betadine leaves welts on skin     Betadine [Povidone Iodine ] Hives and Rash    LEAVES WELTS ON SKIN   Septra [Sulfamethoxazole-Trimethoprim]    Metformin Diarrhea and Other (See Comments)    GI Intolerance   Tape Itching    Use paper tape whenever possible    Medications   Current Facility-Administered Medications:    [START ON 04/24/2024]  stroke: early stages of recovery book, , Does not apply, Once, Amelie Baize, Erin C, NP   0.9 %  sodium chloride  infusion, , Intravenous, Continuous, Lehner, Erin C, NP   acetaminophen  (TYLENOL ) tablet 650 mg, 650 mg, Oral, Q4H PRN **OR** acetaminophen  (TYLENOL ) 160 MG/5ML solution 650 mg, 650 mg, Per Tube, Q4H PRN **OR** acetaminophen  (TYLENOL ) suppository 650 mg, 650 mg, Rectal, Q4H PRN, Lehner, Erin C, NP   heparin  ADULT infusion 100 units/mL (25000 units/250mL), 5,000 Units/hr, Intravenous, Continuous, Reagan Camera, MD   iohexol  (OMNIPAQUE ) 300 MG/ML solution 150 mL, 150 mL, Intra-arterial, Once PRN, Reagan Camera, MD   lidocaine  (XYLOCAINE ) 1 % (with pres) injection 20 mL, 20 mL, Infiltration, Once, Reagan Camera, MD   senna-docusate (Senokot-S) tablet 1 tablet, 1 tablet, Oral, QHS PRN, Lehner, Erin C, NP  Current Outpatient Medications:    acetaminophen  (TYLENOL ) 500 MG tablet, Take 1,000 mg by mouth every 6 (six) hours as needed for mild pain (pain score 1-3) or moderate pain (pain score 4-6). (Patient not taking: Reported on 04/17/2024), Disp: , Rfl:    albuterol  (VENTOLIN  HFA) 108 (90 Base) MCG/ACT inhaler, Inhale 2 puffs into the lungs every 6 (six) hours as needed for wheezing or shortness  of breath., Disp: , Rfl:    amLODipine  (NORVASC ) 10 MG tablet, Take 1 tablet (10 mg total) by mouth daily., Disp: 30 tablet, Rfl: 0   aspirin  81 MG EC tablet, Take 1 tablet (81 mg total) by mouth daily., Disp: 30 tablet, Rfl: 0    atorvastatin  (LIPITOR ) 80 MG tablet, Take 1 tablet (80 mg total) by mouth daily at 6 PM., Disp: 30 tablet, Rfl: 0   B Complex-C-Folic Acid  (RENA-VITE RX) 1 MG TABS, Take 1 tablet by mouth daily., Disp: , Rfl:    Blood Glucose Monitoring Suppl (TRUE METRIX METER) w/Device KIT, , Disp: , Rfl:    calcium  acetate (PHOSLO ) 667 MG capsule, Take 1,334 mg by mouth 3 (three) times daily with meals., Disp: , Rfl:    carvedilol  (COREG ) 25 MG tablet, Take 25 mg by mouth 2 (two) times daily., Disp: , Rfl:    famotidine  (PEPCID ) 40 MG tablet, Take 1 tablet by mouth as needed for heartburn., Disp: , Rfl:    furosemide  (LASIX ) 80 MG tablet, Take 80 mg by mouth 4 (four) times a week. Tues, Thurs, Sat, and Sun (non-dialysis days), Disp: , Rfl:    gabapentin  (NEURONTIN ) 300 MG capsule, Take 300 mg by mouth daily. (Patient not taking: Reported on 04/17/2024), Disp: , Rfl:    lidocaine -prilocaine  (EMLA ) cream, Apply 1 Application topically See admin instructions. Used on Mon Wed and Fri, Disp: , Rfl:    methocarbamol  (ROBAXIN ) 500 MG tablet, Take 1 tablet (500 mg total) by mouth 2 (two) times daily as needed for muscle spasms., Disp: 20 tablet, Rfl: 0   mirtazapine  (REMERON ) 30 MG tablet, Take 30 mg by mouth at bedtime., Disp: , Rfl:    NOVOLOG  MIX 70/30 FLEXPEN (70-30) 100 UNIT/ML FlexPen, Inject 15 Units into the skin 3 (three) times daily with meals., Disp: , Rfl:    topiramate  (TOPAMAX ) 50 MG tablet, Take 50 mg by mouth daily., Disp: , Rfl:    traZODone  (DESYREL ) 50 MG tablet, Take 0.5 tablets (25 mg total) by mouth at bedtime. (Patient taking differently: Take 100 mg by mouth at bedtime.), Disp: , Rfl:    TRUE METRIX BLOOD GLUCOSE TEST test strip, , Disp: , Rfl:    vitamin B-12 (CYANOCOBALAMIN ) 1000 MCG tablet, Take 1,000 mcg by mouth daily., Disp: , Rfl:   Facility-Administered Medications Ordered in Other Encounters:    fentaNYL  citrate (PF) (SUBLIMAZE ) injection, , Intravenous, Anesthesia Intra-op, Leblanc,  Monique, CRNA, 100 mcg at 04/23/24 1353   PHENYLephrine  80 mcg/ml in normal saline Adult IV Push Syringe (For Blood Pressure Support), , Intravenous, Anesthesia Intra-op, Leblanc, Monique, CRNA, 80 mcg at 04/23/24 1357   propofol  (DIPRIVAN ) 10 mg/mL bolus/IV push, , Intravenous, Anesthesia Intra-op, Leblanc, Monique, CRNA, 130 mg at 04/23/24 1353   succinylcholine  (ANECTINE ) syringe, , Intravenous, Anesthesia Intra-op, Leblanc, Monique, CRNA, 160 mg at 04/23/24 1353   Vitals   There were no vitals filed for this visit.   There is no height or weight on file to calculate BMI.  Physical Exam   Constitutional: Appears well-developed and well-nourished.  Cardiovascular: Normal rate and regular rhythm.  Respiratory: Effort normal, non-labored breathing. Room air.   Neurologic Examination   Neuro: Mental Status: Patient is lethargic, easily falls back asleep during exam. She is oriented to self only.  She states she is 78 and that the month is April. Does not give an answer when asked where she is.  Mild aphasia. Patient is hypophonic with mild dysarthria. She is able  to follow simple one-step commands, intermittently only.   Cranial Nerves: II: L eye blind at baseline.  III,IV, VI: EOMI without ptosis  V: Facial sensation is intact VII: R facial droop, mild  VIII: hearing is intact to voice Motor: Tone is normal. Bulk is normal.  LUE/LLE: No drift RUE: immediate drift to bed RLE: some anti-gravity strength seen, moderate drift. Sensory: Sensation is decreased on R. Extinction not present.  Cerebellar: UTA. Patient unable to perform    Labs   CBC:  Recent Labs  Lab 04/23/24 1107 04/23/24 1230  WBC 8.9 8.7  NEUTROABS  --  6.3  HGB 11.9* 11.9*  HCT 37.3 37.6  MCV 92.6 93.1  PLT 196 201   Basic Metabolic Panel:  Lab Results  Component Value Date   NA 135 04/23/2024   K 5.0 04/23/2024   CO2 20 (L) 04/23/2024   GLUCOSE 202 (H) 04/23/2024   BUN 77 (H) 04/23/2024    CREATININE 9.95 (H) 04/23/2024   CALCIUM  8.5 (L) 04/23/2024   GFRNONAA 4 (L) 04/23/2024   GFRAA 18 (L) 09/15/2020   Lipid Panel:  Lab Results  Component Value Date   LDLCALC UNABLE TO CALCULATE IF TRIGLYCERIDE OVER 400 mg/dL 09/81/1914   NWGN5A:  Lab Results  Component Value Date   HGBA1C 8.1 (H) 05/03/2023   Urine Drug Screen:     Component Value Date/Time   LABOPIA NONE DETECTED 02/26/2020 1620   COCAINSCRNUR NONE DETECTED 02/26/2020 1620   LABBENZ NONE DETECTED 02/26/2020 1620   AMPHETMU NONE DETECTED 02/26/2020 1620   THCU NONE DETECTED 02/26/2020 1620   LABBARB NONE DETECTED 02/26/2020 1620    Alcohol  Level     Component Value Date/Time   [facility] <15 04/23/2024 1230   INR  Lab Results  Component Value Date   INR 1.3 (H) 04/23/2024   APTT  Lab Results  Component Value Date   APTT 33 04/23/2024   CT Head without contrast(Personally reviewed): 1. Stable noncontrast CT appearance of the brain since December, remarkable for unusually advanced dural calcification. ASPECTS 10. No acute intracranial hemorrhage. 2. Severe chronic paranasal sinus disease.   CT angio Head and Neck with contrast(Personally reviewed): 1. Negative for large vessel occlusion, But Positive for VERY SEVERE underlying atherosclerosis with: - LICA origin and bilateral ICA siphon RADIOGRAPHIC STRING SIGN STENOSES. - Right ICA distal bulb high-grade stenosis, estimated 70-80% and approaching a radiographic string sign. 2. 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 3. Less pronounced posterior circulation, bilateral vertebral artery atherosclerosis but still evidence of up to moderate bilateral vertebral artery stenoses. 4.  Aortic Atherosclerosis (ICD10-I70.0).  CT perfusion 1. No infarct core or penumbra detected by standard CTP parameters. 2. Suggestion of asymmetric oligemia in the left hemisphere (T-max > 4s), which might relate to the severe ICA stenoses  described on CTA today.  Assessment   MARISAH MONARREZ is a 63 y.o. female with hx of ESRD on HD (MWF) pituitary adenoma c/b visual deficits s/p resection, cerebral aneurysm (unruptured), DM2, CAD s/p CABG, post-op DVT no longer on AC, COPD, HFrEF last EF 35-40%, HTN, HL with R sided weakness that was mild upon awakening but rapidly progressed between 11am-1pm today. On neurology exam at [facility], NIHSS of 15 with aphasia, dysarthria, R-sided weakness (face/arm/leg), R sensory deficit, and R hemineglect.  Head CT showed no acute process.  She was not eligible for TNK due to presentation outside the window. CTA of the head and neck was negative for  emergent large vessel occlusion but positive for LICA ORIGIN AND BILATERAL ICA SIPHON RADIOGRAPHIC STRING SIGN as well as right ICA distal bulb high-grade stenosis estimated to be 70 to 80% and approaching a radiographic string sign.  Patient was transferred to [facility] for emergent IR procedure, likely with stent to be placed. On exam at [facility], NIH was 15.   Primary Diagnosis:  Acute CVA determined by clinical assessment  Secondary Diagnosis: Chronic systolic (congestive) heart failure, Type 2 diabetes mellitus with hyperglycemia , Obesity, ESRD, and CKD Stage 5 (GFR < 15)  Recommendations   - Frequent Neuro checks per stroke unit protocol - MRI Brain stroke protocol - STAT CTH with any acute change in neuro status - TTE - Lipid panel - Statin - will be started if LDL>70 or otherwise medically indicated - A1C - Antithrombotic - PER IR. Patient was given PR ASA prior to transfer from [facility]. - DVT ppx - PER IR  - Smoking cessation - will counsel patient - SBP goal - 120-160 for 1st 24 hours, then less than 180 *unless different parameters requested by IR team - Telemetry monitoring for arrhythmia - 72h - Swallow screen - will be performed prior to PO intake - Stroke education - will be given - PT/OT/SLP  - Dispo: Admit to  ICU  ______________________________________________________________________  Signed, Audrene Lease, NP [facility]   NEUROHOSPITALIST ADDENDUM Performed a face to face diagnostic evaluation.   I have reviewed the contents of history and physical exam as documented by PA/ARNP/Resident and agree with above documentation.  I have discussed and formulated the above plan as documented. Edits to the note have been made as needed.  Impression/Key exam findings/Plan: acute onset R sided weakness with critical L ICA stenosis in addition to multifocal multivessel atheosclerotic disease. Concern at [facility] that the noted L ICA stenosis with a positive string sign is the cause of her profound R sided weakness. On arrival to [facility], continues to have a R facial droop and R sided weakness in arm and leg. Inconsistent blink to threat on the right. Speech is hypophonic, she is lethargic and slept through the ambulance ride, she has poor attention and continues to fall asleep, therefore, hard to evaluate for aphasia out of proportion to her encephalopathy.  She was given Aspirin  300mg  PR at [facility] prior to leaving for [facility].  Agree with endovascular revascularization of severe L ICA stenosis. She will likely need stent placement.  This patient is critically ill and at significant risk of neurological worsening, death and care requires constant monitoring of vital signs, hemodynamics,respiratory and cardiac monitoring, neurological assessment, discussion with family, other specialists and medical decision making of high complexity. I spent 40 minutes of neurocritical care time  in the care of  this patient. This was time spent independent of any time provided by nurse practitioner or PA.  Madysen Faircloth [facility] 04/23/2024  4:44 PM   Roizy Harold, MD [facility] [phone]   If 7pm to 7am, please call on call as listed on AMION.

## 2024-04-23 NOTE — Consult Note (Signed)
 NAME:  Terry Arroyo, MRN:  [id number], DOB:  1961/10/01, LOS: 0 ADMISSION DATE:  04/23/2024, CONSULTATION DATE: 04/23/2024 REFERRING MD:  Reagan Camera, MD , CHIEF COMPLAINT: Right-sided weakness  History of Present Illness:  63 year old female with end-stage renal disease on hemodialysis, prior history of pituitary adenoma complicated with visual field defect, coronary artery disease status post CABG, diabetes type 2 and chronic HFrEF was brought to the emergency department at [facility] with right arm weakness, last well-known was last night, she was not a candidate for TNK due to out of window, CT head was done showed no acute stroke, CTA showing severe left internal carotid artery stenosis and 2 to 3 mm left MCA saccular aneurysm.  Patient was transferred to [facility], underwent emergent left carotid artery angioplasty and balloon placement.  PCCM was consulted for help evaluation medical management as patient remained intubated postprocedure and slow to wake up  Pertinent  Medical History   Past Medical History:  Diagnosis Date   Anemia    Angina at rest [facility])    Angioedema 03/20/2021   Lisinopril    Anterior cerebral aneurysm    approximately 5 mm; left MCA    Anxiety    Aortic atherosclerosis (HCC)    Asthma    Blind left eye    CHF (congestive heart failure) (HCC)    COPD (chronic obstructive pulmonary disease) (HCC)    Coronary artery disease    Depression    DOE (dyspnea on exertion)    ESRD (end stage renal disease) (HCC)    Gait instability    Genital herpes    GERD (gastroesophageal reflux disease)    HFrEF (heart failure with reduced ejection fraction) (HCC)    History of 2019 novel coronavirus disease (COVID-19) 03/2019   [facility] [place]    History of DVT (deep vein thrombosis)    History of DVT (deep vein thrombosis)    Hx of CABG 2018   x 4; performed in [place]    Hyperlipidemia    Hypertension    MI (myocardial infarction)  (HCC) 2018   no stents   Pituitary adenoma (HCC)    PVD (posterior vitreous detachment), right eye    Secondary hyperparathyroidism (HCC)    Sleep apnea    non-compliant with nocturnal PAP therapy   Stroke [facility]) 2014   stated affected left side.   T2DM (type 2 diabetes mellitus) (HCC)      Significant Hospital Events: Including procedures, antibiotic start and stop dates in addition to other pertinent events     Interim History / Subjective:  As above  Objective   Blood pressure (!) 133/121, pulse 66, temperature 97.6 F (36.4 C), temperature source Oral, resp. rate 15, SpO2 100%.    Vent Mode: CPAP;PSV FiO2 (%):  [40 %] 40 % PEEP:  [5 cmH20] 5 cmH20 Pressure Support:  [5 cmH20] 5 cmH20   Intake/Output Summary (Last 24 hours) at 04/23/2024 1637 Last data filed at 04/23/2024 1603 Gross per 24 hour  Intake 700 ml  Output 10 ml  Net 690 ml   There were no vitals filed for this visit.  Examination: General: Crtitically ill-appearing female, orally intubated HEENT: [place]/AT, eyes anicteric, proptosis noted, ETT and OGT in place Neuro: Awake, following commands, moving all 4 extremities right upper extremity is weaker than left.  Mild right facial droop Chest: Coarse breath sounds, no wheezes or rhonchi Heart: Regular rate and rhythm, no murmurs or gallops Abdomen: Soft, nondistended, bowel  sounds present Skin: No rash  Labs and images reviewed  Resolved Hospital Problem list     Assessment & Plan:  Acute left MCA stroke Severe left carotid artery stenosis status post balloon angioplasty and stent placement Left MCA saccular aneurysm Acute respiratory insufficiency postprocedure End-stage renal disease on hemodialysis Chronic combined HFrEF and HFpEF Diabetes type 2 Hypertension Hyperlipidemia  Continue neuro watch Continue secondary stroke prophylaxis Continue IV heparin  infusion at low-dose for 5 hours Patient was loaded with Brilinta Maintain SBP 120-160 per  neuro IR Patient is tolerating spontaneous breathing trial Will try to extubate VAP prevention bundle in place Echocardiogram and MRI is pending Will call nephrology in the morning for hemodialysis needs Continue sliding scale insulin  with CBG goal 140-180 Monitor intake and output Discontinue IV fluid Hold antihypertensive meds Will resume statin by tomorrow    Best Practice (right click and "Reselect all SmartList Selections" daily)   Diet/type: NPO DVT prophylaxis systemic heparin  Pressure ulcer(s): N/A GI prophylaxis: N/A Lines: N/A Foley:  Yes, and it is still needed Code Status:  full code Last date of multidisciplinary goals of care discussion [Per primary team]  Labs   CBC: Recent Labs  Lab 04/23/24 1107 04/23/24 1230  WBC 8.9 8.7  NEUTROABS  --  6.3  HGB 11.9* 11.9*  HCT 37.3 37.6  MCV 92.6 93.1  PLT 196 201    Basic Metabolic Panel: Recent Labs  Lab 04/23/24 1107  NA 135  K 5.0  CL 97*  CO2 20*  GLUCOSE 202*  BUN 77*  CREATININE 9.95*  CALCIUM  8.5*   GFR: Estimated Creatinine Clearance: 6.1 mL/min (A) (by C-G formula based on SCr of 9.95 mg/dL (H)). Recent Labs  Lab 04/23/24 1107 04/23/24 1230  WBC 8.9 8.7    Liver Function Tests: Recent Labs  Lab 04/23/24 1107  AST 12*  ALT 8  ALKPHOS 83  BILITOT 1.5*  PROT 8.2*  ALBUMIN  4.1   No results for input(s): "LIPASE", "AMYLASE" in the last 168 hours. No results for input(s): "AMMONIA" in the last 168 hours.  ABG    Component Value Date/Time   HCO3 24.9 11/01/2020 1606   TCO2 28 05/26/2023 1027   ACIDBASEDEF 0.8 11/01/2020 1606   O2SAT 54.2 11/01/2020 1606     Coagulation Profile: Recent Labs  Lab 04/23/24 1230  INR 1.3*    Cardiac Enzymes: No results for input(s): "CKTOTAL", "CKMB", "CKMBINDEX", "TROPONINI" in the last 168 hours.  HbA1C: Hgb A1c MFr Bld  Date/Time Value Ref Range Status  05/03/2023 07:13 AM 8.1 (H) 4.8 - 5.6 % Final    Comment:    (NOTE) Pre  diabetes:          5.7%-6.4%  Diabetes:              >6.4%  Glycemic control for   <7.0% adults with diabetes   09/06/2021 07:23 AM 8.0 (H) 4.8 - 5.6 % Final    Comment:    (NOTE) Pre diabetes:          5.7%-6.4%  Diabetes:              >6.4%  Glycemic control for   <7.0% adults with diabetes     CBG: Recent Labs  Lab 04/17/24 1146 04/17/24 1415  GLUCAP 229* 208*    Review of Systems:   Unable to obtain as patient is intubated  Past Medical History:  She,  has a past medical history of Anemia, Angina at rest [facility]), Angioedema (03/20/2021),  Anterior cerebral aneurysm, Anxiety, Aortic atherosclerosis (HCC), Asthma, Blind left eye, CHF (congestive heart failure) (HCC), COPD (chronic obstructive pulmonary disease) (HCC), Coronary artery disease, Depression, DOE (dyspnea on exertion), ESRD (end stage renal disease) (HCC), Gait instability, Genital herpes, GERD (gastroesophageal reflux disease), HFrEF (heart failure with reduced ejection fraction) (HCC), History of 2019 novel coronavirus disease (COVID-19) (03/2019), History of DVT (deep vein thrombosis), History of DVT (deep vein thrombosis), CABG (2018), Hyperlipidemia, Hypertension, MI (myocardial infarction) (HCC) (2018), Pituitary adenoma (HCC), PVD (posterior vitreous detachment), right eye, Secondary hyperparathyroidism (HCC), Sleep apnea, Stroke (HCC) (2014), and T2DM (type 2 diabetes mellitus) (HCC).   Surgical History:   Past Surgical History:  Procedure Laterality Date   A/V FISTULAGRAM Right 12/29/2021   Procedure: A/V FISTULAGRAM;  Surgeon: Jackquelyn Mass, MD;  Location: [facility] INVASIVE CV LAB;  Service: Cardiovascular;  Laterality: Right;   A/V FISTULAGRAM Right 06/15/2022   Procedure: A/V Fistulagram;  Surgeon: Jackquelyn Mass, MD;  Location: [facility] INVASIVE CV LAB;  Service: Cardiovascular;  Laterality: Right;   A/V FISTULAGRAM Right 04/17/2024   Procedure: A/V Fistulagram;  Surgeon: Jackquelyn Mass, MD;   Location: [facility] INVASIVE CV LAB;  Service: Cardiovascular;  Laterality: Right;   AV FISTULA PLACEMENT Right 01/28/2021   Procedure: ARTERIOVENOUS (AV) FISTULA CREATION;  Surgeon: Jackquelyn Mass, MD;  Location: [facility] ORS;  Service: Vascular;  Laterality: Right;   AV FISTULA PLACEMENT Right 05/13/2021   Procedure: INSERTION OF ARTERIOVENOUS (AV) GORE-TEX GRAFT ARM ( BRACHIAL AXILLARY );  Surgeon: Jackquelyn Mass, MD;  Location: [facility] ORS;  Service: Vascular;  Laterality: Right;   BRAIN SURGERY  1986   pituitary tumor   BREAST EXCISIONAL BIOPSY Left yrs ago   benign   CESAREAN SECTION     COLONOSCOPY N/A 05/26/2023   Procedure: COLONOSCOPY;  Surgeon: Quintin Buckle, DO;  Location: [facility] ENDOSCOPY;  Service: Gastroenterology;  Laterality: N/A;  DIALYSIS   CORONARY ARTERY BYPASS GRAFT  2018   x 4 vessels; performed in [place]    DIALYSIS/PERMA CATHETER INSERTION N/A 11/07/2020   Procedure: DIALYSIS/PERMA CATHETER INSERTION;  Surgeon: Celso College, MD;  Location: [facility] INVASIVE CV LAB;  Service: Cardiovascular;  Laterality: N/A;   DIALYSIS/PERMA CATHETER REMOVAL N/A 06/12/2021   Procedure: DIALYSIS/PERMA CATHETER REMOVAL;  Surgeon: Celso College, MD;  Location: [facility] INVASIVE CV LAB;  Service: Cardiovascular;  Laterality: N/A;   ESOPHAGOGASTRODUODENOSCOPY (EGD) WITH PROPOFOL  N/A 12/22/2021   Procedure: ESOPHAGOGASTRODUODENOSCOPY (EGD) WITH PROPOFOL ;  Surgeon: Shane Darling, MD;  Location: [facility] ENDOSCOPY;  Service: Gastroenterology;  Laterality: N/A;  IDDM   EYE SURGERY Right 2021   cataracts    IR CT HEAD LTD  04/23/2024   IR PERCUTANEOUS ART THROMBECTOMY/INFUSION INTRACRANIAL INC DIAG ANGIO  04/23/2024   IR US  GUIDE VASC ACCESS RIGHT  04/23/2024   PERIPHERAL VASCULAR THROMBECTOMY Right 04/03/2021   Procedure: A/V Fistulagram ;  Surgeon: Jackquelyn Mass, MD;  Location: [facility] INVASIVE CV LAB;  Service: Cardiovascular;  Laterality: Right;     Social History:   reports that she quit smoking  about 7 years ago. Her smoking use included cigarettes. She has never used smokeless tobacco. She reports that she does not currently use alcohol . She reports that she does not currently use drugs.   Family History:  Her family history includes Breast cancer (age of onset: 62) in her sister; CAD in her brother and mother.   Allergies Allergies  Allergen Reactions   Hydralazine  Hives, Shortness Of Breath, Swelling and Rash  Body aches    Kiwi Extract Hives, Swelling and Rash    Mouth swells    Lisinopril  Swelling    Angioedema   Shellfish Allergy Anaphylaxis    Shrimp/lobster  Betadine leaves welts on skin     Betadine [Povidone Iodine ] Hives and Rash    LEAVES WELTS ON SKIN   Septra [Sulfamethoxazole-Trimethoprim]    Metformin Diarrhea and Other (See Comments)    GI Intolerance   Tape Itching    Use paper tape whenever possible     Home Medications  Prior to Admission medications   Medication Sig Start Date End Date Taking? Authorizing Provider  acetaminophen  (TYLENOL ) 500 MG tablet Take 1,000 mg by mouth every 6 (six) hours as needed for mild pain (pain score 1-3) or moderate pain (pain score 4-6). Patient not taking: Reported on 04/17/2024    [provider]  albuterol  (VENTOLIN  HFA) 108 (90 Base) MCG/ACT inhaler Inhale 2 puffs into the lungs every 6 (six) hours as needed for wheezing or shortness of breath.    [provider]  amLODipine  (NORVASC ) 10 MG tablet Take 1 tablet (10 mg total) by mouth daily. 03/03/20   Luna Salinas, MD  aspirin  81 MG EC tablet Take 1 tablet (81 mg total) by mouth daily. 02/10/21   Brenna Cam, MD  atorvastatin  (LIPITOR ) 80 MG tablet Take 1 tablet (80 mg total) by mouth daily at 6 PM. 11/26/19   Patel, Pranav M, MD  B Complex-C-Folic Acid  (RENA-VITE RX) 1 MG TABS Take 1 tablet by mouth daily. 03/17/23   [provider]  Blood Glucose Monitoring Suppl (TRUE METRIX METER) w/Device KIT  09/02/21   [provider]   calcium  acetate (PHOSLO ) 667 MG capsule Take 1,334 mg by mouth 3 (three) times daily with meals. 11/18/22   [provider]  carvedilol  (COREG ) 25 MG tablet Take 25 mg by mouth 2 (two) times daily.    [provider]  famotidine  (PEPCID ) 40 MG tablet Take 1 tablet by mouth as needed for heartburn. 01/28/22   [provider]  furosemide  (LASIX ) 80 MG tablet Take 80 mg by mouth 4 (four) times a week. Tues, Thurs, Sat, and Sun (non-dialysis days)    [provider]  gabapentin  (NEURONTIN ) 300 MG capsule Take 300 mg by mouth daily. Patient not taking: Reported on 04/17/2024    [provider]  lidocaine -prilocaine  (EMLA ) cream Apply 1 Application topically See admin instructions. Used on Mon Wed and Fri    [provider]  methocarbamol  (ROBAXIN ) 500 MG tablet Take 1 tablet (500 mg total) by mouth 2 (two) times daily as needed for muscle spasms. 12/08/23   Amin, Ankit C, MD  mirtazapine  (REMERON ) 30 MG tablet Take 30 mg by mouth at bedtime.    [provider]  NOVOLOG  MIX 70/30 FLEXPEN (70-30) 100 UNIT/ML FlexPen Inject 15 Units into the skin 3 (three) times daily with meals.    [provider]  topiramate  (TOPAMAX ) 50 MG tablet Take 50 mg by mouth daily. 11/16/23   [provider]  traZODone  (DESYREL ) 50 MG tablet Take 0.5 tablets (25 mg total) by mouth at bedtime. Patient taking differently: Take 100 mg by mouth at bedtime. 04/16/22   Ree Candy, MD  TRUE METRIX BLOOD GLUCOSE TEST test strip  09/04/21   [provider]  vitamin B-12 (CYANOCOBALAMIN ) 1000 MCG tablet Take 1,000 mcg by mouth daily.    [provider]     Critical care time:  The patient is critically ill due to acute left MCA stroke/severe left carotid artery stenosis/acute respiratory insufficiency postprocedure.  Critical care was necessary to treat or prevent imminent or life-threatening deterioration.  Critical care was time  spent personally by me on the following activities: development of treatment plan with patient and/or surrogate as well as nursing, discussions with consultants, evaluation of patient's response to treatment, examination of patient, obtaining history from patient or surrogate, ordering and performing treatments and interventions, ordering and review of laboratory studies, ordering and review of radiographic studies, pulse oximetry, re-evaluation of patient's condition and participation in multidisciplinary rounds.   During this encounter critical care time was devoted to patient care services described in this note for 35 minutes.     Trevor Fudge, MD [facility] See Amion for pager If no response to pager, please call [phone] until 7pm After 7pm, Please call E-link [phone]

## 2024-04-23 NOTE — Progress Notes (Signed)
 IVT to bedside at 1245, pt not in room. Per an ER RN, IV obtained and pt in CT. This RN to CT area, informed by CT tech that 18g obtain. Confirmed via  with stroke team RN. Consult cleared

## 2024-04-23 NOTE — Anesthesia Procedure Notes (Signed)
 Procedure Name: Intubation Date/Time: 04/23/2024 1:55 PM  Performed by: Luwanna Sam, CRNAPre-anesthesia Checklist: Patient identified, Emergency Drugs available, Suction available, Patient being monitored and Timeout performed Patient Re-evaluated:Patient Re-evaluated prior to induction Oxygen Delivery Method: Circle system utilized Preoxygenation: Pre-oxygenation with 100% oxygen Induction Type: IV induction and Rapid sequence Laryngoscope Size: Mac and 4 Tube type: Oral Tube size: 7.5 mm Number of attempts: 1 Placement Confirmation: ETT inserted through vocal cords under direct vision, positive ETCO2 and breath sounds checked- equal and bilateral Secured at: 23 cm Tube secured with: Tape Dental Injury: Teeth and Oropharynx as per pre-operative assessment

## 2024-04-23 NOTE — ED Notes (Signed)
 EMTALA reviewed by charge RN

## 2024-04-23 NOTE — Consult Note (Signed)
 NEUROLOGY CONSULT NOTE   Date of service: Apr 23, 2024 Patient Name: Terry Arroyo MRN:  [id number] DOB:  Apr 13, 1961 Chief Complaint: R sided weakness Requesting Provider: Claria Crofts, Terry Arroyo  History of Present Illness   Terry Arroyo is a 63 y.o. female with hx of ESRD on HD (MWF) pituitary adenoma c/b visual deficits s/p resection, cerebral aneurysm (unruptured), DM2, CAD s/p CABG, post-op DVT no longer on AC, COPD, HFrEF last EF 35-40%, HTN, HL who presented to ED with back pain and R sided weakness. She reports the back pain has been present for a few weeks but is getting worse. LKW last night prior to going to bed, she woke up this AM with mild RUE weakness which has progressed since then. She was able to get on the bus for dialysis this AM but felt increasingly weak on the ride. She was able to ambulate into dialysis with her walker but dragging her R foot. Dialysis providers initially called EMS out saying that she was reporting back pain. On EDP exam she was noted to have drift in RUE and RLE and called stroke code at 11:45am. Symptoms had rapidly worsened over the prior ~30 min and she now had a stroke scale of 14, see below. Head CT showed no acute process ASPECTS 10.  She was not eligible for TNK due to presentation outside the window.  CTA of the head and neck was negative for emergent large vessel occlusion but positive for very severe underlying atherosclerosis particularly LICA ORIGIN AND BILATERAL ICA SIPHON RADIOGRAPHIC STRING SIGN as well as right ICA distal bulb high-grade stenosis estimated to be 70 to 80% and approaching a radiographic string sign.  Also noted was a 2 to 3 mm left MCA bifurcation saccular aneurysm.  Patient is not on anticoagulation.    Patient was unable to consent for procedure due to aphasia.  I was unable to get in touch with family; I called her father at the number listed in the chart 3 times with no answer.  The number listed for her sister has been  disconnected.  I discussed the patient and these findings at length with our neuro-interventionalist Terry Arroyo. We felt that she was hypoperfusing on the L 2/2 the L ICA critical stenosis and this was supported by subsequent CTP data that showed asymmetric oligemia in the left hemisphere (with a Tmax greater than 4 seconds).  After a long discussion of the risks and benefits of intervention Terry Arroyo and I both decided that it would be in the patient's best interest to proceed with diagnostic arteriogram and planned carotid stenting using two-physician consent.  LKW: last night prior to going to sleep, symptoms rapidly progressed between 11am-1pm today Modified rankin score: 3-Moderate disability-requires help but walks WITHOUT assistance  NIHSS components Score: Comment  1a Level of Conscious 0[x]  1[]  2[]  3[]      1b LOC Questions 0[]  1[x]  2[]       1c LOC Commands 0[x]  1[]  2[]       2 Best Gaze 0[x]  1[]  2[]       3 Visual 0[]  1[]  2[x]  3[]      4 Facial Palsy 0[]  1[x]  2[]  3[]      5a Motor Arm - left 0[]  1[x]  2[]  3[]  4[]  UN[]    5b Motor Arm - Right 0[]  1[]  2[x]  3[]  4[]  UN[]    6a Motor Leg - Left 0[x]  1[]  2[]  3[]  4[]  UN[]    6b Motor Leg - Right 0[]  1[]  2[]  3[x]  4[]  UN[]   7 Limb Ataxia 0[x]  1[]  2[]  UN[]      8 Sensory 0[]  1[x]  2[]  UN[]      9 Best Language 0[]  1[x]  2[]  3[]      10 Dysarthria 0[]  1[x]  2[]  UN[]      11 Extinct. and Inattention 0[]  1[x]  2[]       TOTAL:  14       ROS   UTA 2/2 aphasia  Past History   Past Medical History:  Diagnosis Date   Anemia    Angina at rest [facility])    Angioedema 03/20/2021   Lisinopril    Anterior cerebral aneurysm    approximately 5 mm; left MCA    Anxiety    Aortic atherosclerosis (HCC)    Asthma    Blind left eye    CHF (congestive heart failure) (HCC)    COPD (chronic obstructive pulmonary disease) (HCC)    Coronary artery disease    Depression    DOE (dyspnea on exertion)    ESRD (end stage renal disease) (HCC)    Gait instability     Genital herpes    GERD (gastroesophageal reflux disease)    HFrEF (heart failure with reduced ejection fraction) (HCC)    History of 2019 novel coronavirus disease (COVID-19) 03/2019   [facility] [place]    History of DVT (deep vein thrombosis)    History of DVT (deep vein thrombosis)    Hx of CABG 2018   x 4; performed in [place]    Hyperlipidemia    Hypertension    MI (myocardial infarction) (HCC) 2018   no stents   Pituitary adenoma (HCC)    PVD (posterior vitreous detachment), right eye    Secondary hyperparathyroidism (HCC)    Sleep apnea    non-compliant with nocturnal PAP therapy   Stroke [facility]) 2014   stated affected left side.   T2DM (type 2 diabetes mellitus) [facility])     Past Surgical History:  Procedure Laterality Date   A/V FISTULAGRAM Right 12/29/2021   Procedure: A/V FISTULAGRAM;  Surgeon: Jackquelyn Mass, Terry Arroyo;  Location: [facility] INVASIVE CV LAB;  Service: Cardiovascular;  Laterality: Right;   A/V FISTULAGRAM Right 06/15/2022   Procedure: A/V Fistulagram;  Surgeon: Jackquelyn Mass, Terry Arroyo;  Location: [facility] INVASIVE CV LAB;  Service: Cardiovascular;  Laterality: Right;   A/V FISTULAGRAM Right 04/17/2024   Procedure: A/V Fistulagram;  Surgeon: Jackquelyn Mass, Terry Arroyo;  Location: [facility] INVASIVE CV LAB;  Service: Cardiovascular;  Laterality: Right;   AV FISTULA PLACEMENT Right 01/28/2021   Procedure: ARTERIOVENOUS (AV) FISTULA CREATION;  Surgeon: Jackquelyn Mass, Terry Arroyo;  Location: [facility] ORS;  Service: Vascular;  Laterality: Right;   AV FISTULA PLACEMENT Right 05/13/2021   Procedure: INSERTION OF ARTERIOVENOUS (AV) GORE-TEX GRAFT ARM ( BRACHIAL AXILLARY );  Surgeon: Jackquelyn Mass, Terry Arroyo;  Location: [facility] ORS;  Service: Vascular;  Laterality: Right;   BRAIN SURGERY  1986   pituitary tumor   BREAST EXCISIONAL BIOPSY Left yrs ago   benign   CESAREAN SECTION     COLONOSCOPY N/A 05/26/2023   Procedure: COLONOSCOPY;  Surgeon: Quintin Buckle, DO;  Location: [facility]  ENDOSCOPY;  Service: Gastroenterology;  Laterality: N/A;  DIALYSIS   CORONARY ARTERY BYPASS GRAFT  2018   x 4 vessels; performed in [place]    DIALYSIS/PERMA CATHETER INSERTION N/A 11/07/2020   Procedure: DIALYSIS/PERMA CATHETER INSERTION;  Surgeon: Celso College, Terry Arroyo;  Location: [facility] INVASIVE CV LAB;  Service: Cardiovascular;  Laterality: N/A;   DIALYSIS/PERMA CATHETER REMOVAL N/A 06/12/2021  Procedure: DIALYSIS/PERMA CATHETER REMOVAL;  Surgeon: Celso College, Terry Arroyo;  Location: [facility] INVASIVE CV LAB;  Service: Cardiovascular;  Laterality: N/A;   ESOPHAGOGASTRODUODENOSCOPY (EGD) WITH PROPOFOL  N/A 12/22/2021   Procedure: ESOPHAGOGASTRODUODENOSCOPY (EGD) WITH PROPOFOL ;  Surgeon: Shane Darling, Terry Arroyo;  Location: [facility] ENDOSCOPY;  Service: Gastroenterology;  Laterality: N/A;  IDDM   EYE SURGERY Right 2021   cataracts    PERIPHERAL VASCULAR THROMBECTOMY Right 04/03/2021   Procedure: A/V Fistulagram ;  Surgeon: Jackquelyn Mass, Terry Arroyo;  Location: [facility] INVASIVE CV LAB;  Service: Cardiovascular;  Laterality: Right;    Family History: Family History  Problem Relation Age of Onset   CAD Mother    CAD Brother    Breast cancer Sister 80    Social History  reports that she quit smoking about 7 years ago. Her smoking use included cigarettes. She has never used smokeless tobacco. She reports that she does not currently use alcohol . She reports that she does not currently use drugs.  Allergies  Allergen Reactions   Hydralazine  Hives, Shortness Of Breath, Swelling and Rash    Body aches    Kiwi Extract Hives, Swelling and Rash    Mouth swells    Lisinopril  Swelling    Angioedema   Shellfish Allergy Anaphylaxis    Shrimp/lobster  Betadine leaves welts on skin     Betadine [Povidone Iodine ] Hives and Rash    LEAVES WELTS ON SKIN   Septra [Sulfamethoxazole-Trimethoprim]    Metformin Diarrhea and Other (See Comments)    GI Intolerance   Tape Itching    Use paper tape whenever possible     Medications   Current Facility-Administered Medications:    lidocaine  (LIDODERM ) 5 % 1 patch, 1 patch, Transdermal, Q24H, Terry Bennett, Terry Arroyo, 1 patch at 04/23/24 1131  Current Outpatient Medications:    acetaminophen  (TYLENOL ) 500 MG tablet, Take 1,000 mg by mouth every 6 (six) hours as needed for mild pain (pain score 1-3) or moderate pain (pain score 4-6). (Patient not taking: Reported on 04/17/2024), Disp: , Rfl:    albuterol  (VENTOLIN  HFA) 108 (90 Base) MCG/ACT inhaler, Inhale 2 puffs into the lungs every 6 (six) hours as needed for wheezing or shortness of breath., Disp: , Rfl:    amLODipine  (NORVASC ) 10 MG tablet, Take 1 tablet (10 mg total) by mouth daily., Disp: 30 tablet, Rfl: 0   aspirin  81 MG EC tablet, Take 1 tablet (81 mg total) by mouth daily., Disp: 30 tablet, Rfl: 0   atorvastatin  (LIPITOR ) 80 MG tablet, Take 1 tablet (80 mg total) by mouth daily at 6 PM., Disp: 30 tablet, Rfl: 0   B Complex-C-Folic Acid  (RENA-VITE RX) 1 MG TABS, Take 1 tablet by mouth daily., Disp: , Rfl:    Blood Glucose Monitoring Suppl (TRUE METRIX METER) w/Device KIT, , Disp: , Rfl:    calcium  acetate (PHOSLO ) 667 MG capsule, Take 1,334 mg by mouth 3 (three) times daily with meals., Disp: , Rfl:    carvedilol  (COREG ) 25 MG tablet, Take 25 mg by mouth 2 (two) times daily., Disp: , Rfl:    famotidine  (PEPCID ) 40 MG tablet, Take 1 tablet by mouth as needed for heartburn., Disp: , Rfl:    furosemide  (LASIX ) 80 MG tablet, Take 80 mg by mouth 4 (four) times a week. Tues, Thurs, Sat, and Sun (non-dialysis days), Disp: , Rfl:    gabapentin  (NEURONTIN ) 300 MG capsule, Take 300 mg by mouth daily. (Patient not taking: Reported on 04/17/2024), Disp: , Rfl:  lidocaine -prilocaine  (EMLA ) cream, Apply 1 Application topically See admin instructions. Used on Mon Wed and Fri, Disp: , Rfl:    methocarbamol  (ROBAXIN ) 500 MG tablet, Take 1 tablet (500 mg total) by mouth 2 (two) times daily as needed for muscle spasms., Disp: 20  tablet, Rfl: 0   mirtazapine  (REMERON ) 30 MG tablet, Take 30 mg by mouth at bedtime., Disp: , Rfl:    NOVOLOG  MIX 70/30 FLEXPEN (70-30) 100 UNIT/ML FlexPen, Inject 15 Units into the skin 3 (three) times daily with meals., Disp: , Rfl:    topiramate  (TOPAMAX ) 50 MG tablet, Take 50 mg by mouth daily., Disp: , Rfl:    traZODone  (DESYREL ) 50 MG tablet, Take 0.5 tablets (25 mg total) by mouth at bedtime. (Patient taking differently: Take 100 mg by mouth at bedtime.), Disp: , Rfl:    TRUE METRIX BLOOD GLUCOSE TEST test strip, , Disp: , Rfl:    vitamin B-12 (CYANOCOBALAMIN ) 1000 MCG tablet, Take 1,000 mcg by mouth daily., Disp: , Rfl:   Vitals   Vitals:   05/18/2024 1105 May 18, 2024 1106 May 18, 2024 1108  BP:  (!) 159/75   Pulse:  (!) 59   Resp:   18  Temp:   98.6 F (37 C)  TempSrc:   Oral  SpO2:  96%   Weight: 73.5 kg    Height: 5\' 6"  (1.676 m)      Body mass index is 26.15 kg/m.  Physical Exam   Gen: patient lying in bed, NAD CV: extremities appear well-perfused Resp: normal WOB  Neurologic Examination   MS: alert, oriented x1, follows commands Speech: mild dysarthria, mild-to-moderate aphasia CN: PERRL, blind in L eye (chronic), EOMI, sensation intact, R UMN facial droop, hearing intact to voice Motor: LUE drift but not to bed, LLE no drift, RUE drift to bed, RLE no movement against gravity Sensory: Sensory impairment on R, hemineglect on R Reflexes: 1+ symm with toes down bilat Coordination: UTA Gait: deferred  Labs/Imaging/Neurodiagnostic studies   CBC:  Recent Labs  Lab 18-May-2024 1107  WBC 8.9  HGB 11.9*  HCT 37.3  MCV 92.6  PLT 196   Basic Metabolic Panel:  Lab Results  Component Value Date   NA 135 18-May-2024   K 5.0 05/18/24   CO2 20 (L) 05-18-2024   GLUCOSE 202 (H) 2024/05/18   BUN 77 (H) 05/18/24   CREATININE 9.95 (H) 05/18/24   CALCIUM  8.5 (L) 2024/05/18   GFRNONAA 4 (L) 05-18-24   GFRAA 18 (L) 09/15/2020   Lipid Panel:  Lab Results  Component  Value Date   LDLCALC UNABLE TO CALCULATE IF TRIGLYCERIDE OVER 400 mg/dL 81/19/1478   GNFA2Z:  Lab Results  Component Value Date   HGBA1C 8.1 (H) 05/03/2023   Urine Drug Screen:     Component Value Date/Time   LABOPIA NONE DETECTED 02/26/2020 1620   COCAINSCRNUR NONE DETECTED 02/26/2020 1620   LABBENZ NONE DETECTED 02/26/2020 1620   AMPHETMU NONE DETECTED 02/26/2020 1620   THCU NONE DETECTED 02/26/2020 1620   LABBARB NONE DETECTED 02/26/2020 1620    Alcohol  Level No results found for: "ETH" INR  Lab Results  Component Value Date   INR 1.2 03/29/2022   APTT  Lab Results  Component Value Date   APTT 30 03/29/2022   AED levels: No results found for: "PHENYTOIN", "ZONISAMIDE", "LAMOTRIGINE", "LEVETIRACETA"  CT Head without contrast(Personally reviewed): 1. Stable noncontrast CT appearance of the brain since December, remarkable for unusually advanced dural calcification. ASPECTS 10. No acute intracranial hemorrhage. 2.  Severe chronic paranasal sinus disease.  CT angio Head and Neck with contrast(Personally reviewed): 1. Negative for large vessel occlusion, But Positive for VERY SEVERE underlying atherosclerosis with: - LICA origin and bilateral ICA siphon RADIOGRAPHIC STRING SIGN STENOSES. - Right ICA distal bulb high-grade stenosis, estimated 70-80% and approaching a radiographic string sign.   2. 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   3. Less pronounced posterior circulation, bilateral vertebral artery atherosclerosis but still evidence of up to moderate bilateral vertebral artery stenoses.   4.  Aortic Atherosclerosis (ICD10-I70.0).    CT perfusion 1. NO infarct core or penumbra detected by standard CTP parameters. 2. Suggestion of asymmetric oligemia in the left hemisphere (T-max > 4s), which might relate to the severe ICA stenoses described on CTA today.   ASSESSMENT   Terry Arroyo is a 63 y.o. female with hx of ESRD on  HD (MWF) pituitary adenoma c/b visual deficits s/p resection, cerebral aneurysm (unruptured), DM2, CAD s/p CABG, post-op DVT no longer on AC, COPD, HFrEF last EF 35-40%, HTN, HL with R sided weakness that was mild upon awakening but rapidly progressed between 11am-1pm today and now has a NIHSS of 15 with aphasia, dysarthria, R-sided weakness (face/arm/leg), R sensory deficit, and R hemineglect.  \Head CT showed no acute process ASPECTS 10.  She was not eligible for TNK due to presentation outside the window.  CTA of the head and neck was negative for emergent large vessel occlusion but positive for very severe underlying atherosclerosis particularly LICA ORIGIN AND BILATERAL ICA SIPHON RADIOGRAPHIC STRING SIGN as well as right ICA distal bulb high-grade stenosis estimated to be 70 to 80% and approaching a radiographic string sign.   Patient was unable to consent for procedure due to aphasia.  I was unable to get in touch with family; I called her father at the number listed in the chart 3 times with no answer.  The number listed for her sister has been disconnected.  I discussed the patient and these findings at length with our neuro-interventionalist Terry Arroyo. We felt that she was hypoperfusing on the L 2/2 the L ICA critical stenosis and this was supported by subsequent CTP data that showed asymmetric oligemia in the left hemisphere (with a Tmax greater than 4 seconds).  After a long discussion of the risks and benefits of intervention Terry Arroyo and I both decided that it would be in the patient's best interest to proceed with diagnostic arteriogram and planned carotid stenting using two-physician consent.  RECOMMENDATIONS   - STAT transfer via EMS to Terry Arroyo for emergent neurointervention with Terry Arroyo - Code IR already activated - Patient has heart failure and has not been dialyzed since Friday, BP 130s systolic currently, lay flat if she can tolerate it, she is unlikely to tolerate IV bolus  prior to arrival to [facility] - Permissive HTN up to 220/120 for now, post-procedure BP parameters, antiplatelets, neurochecks, and other recommendations per neuro-IR and admitting neurohospitalist Terry Arroyo - 300mg  PR aspirin  administered at [facility]  Patient is currently en route to [facility], d/w Drs. Terry Arroyo, and Terry Arroyo  This patient is critically ill and at significant risk of neurological worsening, death and care requires constant monitoring of vital signs, hemodynamics,respiratory and cardiac monitoring, neurological assessment, discussion with family, other specialists and medical decision making of high complexity. I spent 90 minutes of neurocritical care time  in the care of  this patient. This was time spent independent of any time  provided by nurse practitioner or PA.  Terry Leaks, Terry Arroyo [facility] [phone]  If 7pm- 7am, please page neurology on call as listed in AMION.

## 2024-04-24 ENCOUNTER — Other Ambulatory Visit (HOSPITAL_COMMUNITY): Payer: Self-pay

## 2024-04-24 ENCOUNTER — Inpatient Hospital Stay (HOSPITAL_COMMUNITY)

## 2024-04-24 ENCOUNTER — Encounter (HOSPITAL_COMMUNITY): Payer: Self-pay | Admitting: Radiology

## 2024-04-24 ENCOUNTER — Telehealth (HOSPITAL_COMMUNITY): Payer: Self-pay | Admitting: Pharmacy Technician

## 2024-04-24 DIAGNOSIS — I63232 Cerebral infarction due to unspecified occlusion or stenosis of left carotid arteries: Secondary | ICD-10-CM | POA: Diagnosis not present

## 2024-04-24 DIAGNOSIS — I132 Hypertensive heart and chronic kidney disease with heart failure and with stage 5 chronic kidney disease, or end stage renal disease: Secondary | ICD-10-CM | POA: Diagnosis not present

## 2024-04-24 DIAGNOSIS — R29704 NIHSS score 4: Secondary | ICD-10-CM | POA: Diagnosis not present

## 2024-04-24 DIAGNOSIS — I5023 Acute on chronic systolic (congestive) heart failure: Secondary | ICD-10-CM

## 2024-04-24 DIAGNOSIS — N186 End stage renal disease: Secondary | ICD-10-CM

## 2024-04-24 DIAGNOSIS — E1122 Type 2 diabetes mellitus with diabetic chronic kidney disease: Secondary | ICD-10-CM

## 2024-04-24 DIAGNOSIS — I6522 Occlusion and stenosis of left carotid artery: Secondary | ICD-10-CM

## 2024-04-24 DIAGNOSIS — Z794 Long term (current) use of insulin: Secondary | ICD-10-CM

## 2024-04-24 DIAGNOSIS — Z992 Dependence on renal dialysis: Secondary | ICD-10-CM

## 2024-04-24 DIAGNOSIS — E785 Hyperlipidemia, unspecified: Secondary | ICD-10-CM

## 2024-04-24 DIAGNOSIS — I5042 Chronic combined systolic (congestive) and diastolic (congestive) heart failure: Secondary | ICD-10-CM | POA: Diagnosis not present

## 2024-04-24 DIAGNOSIS — Z7902 Long term (current) use of antithrombotics/antiplatelets: Secondary | ICD-10-CM

## 2024-04-24 DIAGNOSIS — I639 Cerebral infarction, unspecified: Secondary | ICD-10-CM

## 2024-04-24 DIAGNOSIS — I6523 Occlusion and stenosis of bilateral carotid arteries: Secondary | ICD-10-CM | POA: Insufficient documentation

## 2024-04-24 LAB — COMPREHENSIVE METABOLIC PANEL WITH GFR
ALT: 9 U/L (ref 0–44)
AST: 11 U/L — ABNORMAL LOW (ref 15–41)
Albumin: 3.4 g/dL — ABNORMAL LOW (ref 3.5–5.0)
Alkaline Phosphatase: 74 U/L (ref 38–126)
Anion gap: 16 — ABNORMAL HIGH (ref 5–15)
BUN: 75 mg/dL — ABNORMAL HIGH (ref 8–23)
CO2: 18 mmol/L — ABNORMAL LOW (ref 22–32)
Calcium: 8.1 mg/dL — ABNORMAL LOW (ref 8.9–10.3)
Chloride: 100 mmol/L (ref 98–111)
Creatinine, Ser: 10.29 mg/dL — ABNORMAL HIGH (ref 0.44–1.00)
GFR, Estimated: 4 mL/min — ABNORMAL LOW (ref >60)
Glucose, Bld: 73 mg/dL (ref 70–99)
Potassium: 4.8 mmol/L (ref 3.5–5.1)
Sodium: 134 mmol/L — ABNORMAL LOW (ref 135–145)
Total Bilirubin: 0.9 mg/dL (ref 0.0–1.2)
Total Protein: 7.2 g/dL (ref 6.5–8.1)

## 2024-04-24 LAB — LIPID PANEL
Cholesterol: 137 mg/dL (ref 0–200)
HDL: 33 mg/dL — ABNORMAL LOW (ref >40)
LDL Cholesterol: 75 mg/dL (ref 0–99)
Total CHOL/HDL Ratio: 4.2 ratio
Triglycerides: 144 mg/dL (ref <150)
VLDL: 29 mg/dL (ref 0–40)

## 2024-04-24 LAB — CBC
HCT: 34.1 % — ABNORMAL LOW (ref 36.0–46.0)
Hemoglobin: 11.2 g/dL — ABNORMAL LOW (ref 12.0–15.0)
MCH: 29.8 pg (ref 26.0–34.0)
MCHC: 32.8 g/dL (ref 30.0–36.0)
MCV: 90.7 fL (ref 80.0–100.0)
Platelets: 186 10*3/uL (ref 150–400)
RBC: 3.76 MIL/uL — ABNORMAL LOW (ref 3.87–5.11)
RDW: 14.6 % (ref 11.5–15.5)
WBC: 9.6 10*3/uL (ref 4.0–10.5)
nRBC: 0 % (ref 0.0–0.2)

## 2024-04-24 LAB — PROTIME-INR
INR: 1.1 (ref 0.8–1.2)
Prothrombin Time: 14.8 s (ref 11.4–15.2)

## 2024-04-24 LAB — GLUCOSE, CAPILLARY
Glucose-Capillary: 72 mg/dL (ref 70–99)
Glucose-Capillary: 66 mg/dL — ABNORMAL LOW (ref 70–99)
Glucose-Capillary: 84 mg/dL (ref 70–99)
Glucose-Capillary: 153 mg/dL — ABNORMAL HIGH (ref 70–99)
Glucose-Capillary: 160 mg/dL — ABNORMAL HIGH (ref 70–99)

## 2024-04-24 LAB — HEPATITIS B SURFACE ANTIGEN: Hepatitis B Surface Ag: NONREACTIVE

## 2024-04-24 MED ORDER — CHLORHEXIDINE GLUCONATE CLOTH 2 % EX PADS
6.0000 | MEDICATED_PAD | Freq: Every day | CUTANEOUS | Status: DC
Start: 2024-04-25 — End: 2024-04-28
  Administered 2024-04-25 – 2024-04-28 (×4): 6 via TOPICAL

## 2024-04-24 MED ORDER — EZETIMIBE 10 MG PO TABS
10.0000 mg | ORAL_TABLET | Freq: Every day | ORAL | Status: DC
  Administered 2024-04-24 – 2024-04-28 (×5): 10 mg via ORAL
  Filled 2024-04-24 (×5): qty 1

## 2024-04-24 MED ORDER — INSULIN ASPART 100 UNIT/ML IJ SOLN
0.0000 [IU] | Freq: Every day | INTRAMUSCULAR | Status: DC
  Administered 2024-04-27: 4 [IU] via SUBCUTANEOUS

## 2024-04-24 MED ORDER — ATORVASTATIN CALCIUM 80 MG PO TABS
80.0000 mg | ORAL_TABLET | Freq: Every day | ORAL | Status: DC
  Administered 2024-04-24 – 2024-04-28 (×5): 80 mg via ORAL
  Filled 2024-04-24 (×5): qty 1

## 2024-04-24 MED ORDER — MIRTAZAPINE 15 MG PO TABS
30.0000 mg | ORAL_TABLET | Freq: Every day | ORAL | Status: DC
  Administered 2024-04-25 – 2024-04-27 (×4): 30 mg via ORAL
  Filled 2024-04-24 (×4): qty 2

## 2024-04-24 MED ORDER — INSULIN ASPART 100 UNIT/ML IJ SOLN: 0.0000 [IU] | Freq: Every day | INTRAMUSCULAR | Status: DC

## 2024-04-24 MED ORDER — INSULIN ASPART 100 UNIT/ML IJ SOLN
0.0000 [IU] | Freq: Three times a day (TID) | INTRAMUSCULAR | Status: DC
  Administered 2024-04-24 – 2024-04-25 (×2): 1 [IU] via SUBCUTANEOUS
  Administered 2024-04-26: 2 [IU] via SUBCUTANEOUS
  Administered 2024-04-27 – 2024-04-28 (×3): 1 [IU] via SUBCUTANEOUS

## 2024-04-24 MED ORDER — TOPIRAMATE 25 MG PO TABS
50.0000 mg | ORAL_TABLET | Freq: Every day | ORAL | Status: DC
  Administered 2024-04-24 – 2024-04-28 (×5): 50 mg via ORAL
  Filled 2024-04-24 (×5): qty 2

## 2024-04-24 MED ORDER — ALBUMIN HUMAN 25 % IV SOLN
INTRAVENOUS | Status: AC
  Filled 2024-04-24: qty 100

## 2024-04-24 MED ORDER — INSULIN ASPART 100 UNIT/ML IJ SOLN: 0.0000 [IU] | Freq: Three times a day (TID) | INTRAMUSCULAR | Status: DC

## 2024-04-24 MED ORDER — GABAPENTIN 300 MG PO CAPS
300.0000 mg | ORAL_CAPSULE | Freq: Every day | ORAL | Status: DC
  Administered 2024-04-25 – 2024-04-27 (×4): 300 mg via ORAL
  Filled 2024-04-24 (×4): qty 1

## 2024-04-24 MED ORDER — AMLODIPINE BESYLATE 5 MG PO TABS
10.0000 mg | ORAL_TABLET | Freq: Every day | ORAL | Status: DC
  Administered 2024-04-24 – 2024-04-28 (×5): 10 mg via ORAL
  Filled 2024-04-24: qty 1
  Filled 2024-04-24 (×4): qty 2

## 2024-04-24 MED ORDER — HEPARIN SODIUM (PORCINE) 5000 UNIT/ML IJ SOLN
5000.0000 [IU] | Freq: Three times a day (TID) | INTRAMUSCULAR | Status: DC
  Administered 2024-04-24 – 2024-04-28 (×12): 5000 [IU] via SUBCUTANEOUS
  Filled 2024-04-24 (×12): qty 1

## 2024-04-24 MED ORDER — LABETALOL HCL 5 MG/ML IV SOLN
10.0000 mg | INTRAVENOUS | Status: DC | PRN
Start: 2024-04-24 — End: 2024-04-28
  Administered 2024-04-24: 10 mg via INTRAVENOUS

## 2024-04-24 MED ORDER — ALBUMIN HUMAN 25 % IV SOLN
25.0000 g | Freq: Once | INTRAVENOUS | Status: AC
  Administered 2024-04-24: 25 g via INTRAVENOUS

## 2024-04-24 MED ORDER — HEPARIN SODIUM (PORCINE) 1000 UNIT/ML IJ SOLN
INTRAMUSCULAR | Status: AC
  Filled 2024-04-24: qty 1

## 2024-04-24 MED ORDER — TICAGRELOR 90 MG PO TABS
90.0000 mg | ORAL_TABLET | Freq: Two times a day (BID) | ORAL | Status: DC
Start: 2024-04-24 — End: 2024-04-28
  Administered 2024-04-24 – 2024-04-28 (×9): 90 mg via ORAL
  Filled 2024-04-24 (×9): qty 1

## 2024-04-24 MED ORDER — TRAZODONE HCL 50 MG PO TABS
50.0000 mg | ORAL_TABLET | Freq: Every day | ORAL | Status: DC
  Administered 2024-04-25 – 2024-04-27 (×4): 50 mg via ORAL
  Filled 2024-04-24 (×4): qty 1

## 2024-04-24 MED ORDER — HYDROCODONE-ACETAMINOPHEN 5-325 MG PO TABS
1.0000 | ORAL_TABLET | Freq: Once | ORAL | Status: AC
  Administered 2024-04-25: 1 via ORAL
  Filled 2024-04-24: qty 1

## 2024-04-24 MED ORDER — ASPIRIN 81 MG PO CHEW
81.0000 mg | CHEWABLE_TABLET | Freq: Every day | ORAL | Status: DC
  Administered 2024-04-24 – 2024-04-28 (×5): 81 mg via ORAL
  Filled 2024-04-24 (×5): qty 1

## 2024-04-24 NOTE — Progress Notes (Signed)
 Inpatient Rehab Admissions Coordinator:   I met with pt. To discuss potential CIR admit. She states that prior to admission she was caring for her 62 year old father. She states that he cannot care for her and that her sister cares for her own children and cannot assist at d/c. Her brother lives in [place] and cannot assist. Pt. States she does not have other family to assist. She would like to pursue SNF for short term rehab in hopes that she can stay longer with the potential to reach independence and return home.   Wandalee Gust, MS, CCC-SLP Rehab Admissions Coordinator  [phone] (celll) [phone] (office)

## 2024-04-24 NOTE — Evaluation (Signed)
 Speech Language Pathology Evaluation Patient Details Name: Terry Arroyo MRN: [id number] DOB: 1960/12/31 Today's Date: 04/24/2024 Time: 4098-1191 SLP Time Calculation (min) (ACUTE ONLY): 19 min  Problem List:  Patient Active Problem List   Diagnosis Date Noted   Carotid stenosis, left 04/24/2024   Stroke (HCC) 04/23/2024   Acute ischemic left MCA stroke (HCC) 04/23/2024   Ambulatory dysfunction 12/08/2023   Gait instability 12/07/2023   Sinus bradycardia 12/07/2023   Shortness of breath 05/02/2023   Weakness    Elevated BUN    Nausea and vomiting 04/14/2022   Generalized weakness 04/14/2022   Dyslipidemia 04/14/2022   GERD without esophagitis 04/14/2022   Type 2 diabetes mellitus without complications (HCC) 04/14/2022   Uncontrolled type 2 diabetes mellitus with hyperglycemia, with long-term current use of insulin  (HCC) 04/14/2022   Physical debility 02/13/2022   Acute pain of left thigh 02/09/2022   Cellulitis of left thigh 02/09/2022   Atherosclerosis of native arteries of extremity with intermittent claudication (HCC) 02/04/2022   Leg pain, anterior, left 02/04/2022   Angioedema 10/28/2021   Lactic acidosis    Diabetic polyneuropathy associated with type 2 diabetes mellitus (HCC)    Sepsis (HCC) 09/05/2021   Acute CHF (congestive heart failure) (HCC) 09/05/2021   Fluid overload 06/11/2021   Hypokalemia 06/11/2021   Acute pulmonary edema (HCC)    COPD (chronic obstructive pulmonary disease) with emphysema (HCC) 02/09/2021   Chronic sphenoidal sinusitis 01/01/2021   Contact dermatitis and other eczema due to other specified agent 01/01/2021   Hydronephrosis, left 01/01/2021   Other diseases of nasal cavity and sinuses(478.19) 01/01/2021   End-stage renal disease on hemodialysis (HCC)    Reactive thrombocytosis 11/08/2020   Nephrotic syndrome 11/04/2020   SOB (shortness of breath) 11/01/2020   Acute on chronic combined systolic and diastolic CHF (congestive heart  failure) (HCC) 10/21/2020   CAD (coronary artery disease) 10/21/2020   Hyperlipidemia associated with type 2 diabetes mellitus (HCC) 10/21/2020   HFrEF (heart failure with reduced ejection fraction) (HCC)    Acute respiratory distress    COPD exacerbation (HCC) 10/10/2020   Kidney hematoma 09/13/2020   Essential hypertension    Acute renal failure superimposed on stage 4 chronic kidney disease (HCC)    Hematuria 09/12/2020   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) 09/03/2020   Blind left eye 09/03/2020   Brain aneurysm 09/03/2020   Esotropia 09/03/2020   Lesion of ulnar nerve 09/03/2020   Mixed hyperlipidemia 09/03/2020   Sensory hearing loss, bilateral 09/03/2020   Tear film insufficiency 09/03/2020   Health maintenance examination 06/27/2020   Secondary hyperparathyroidism of renal origin (HCC) 04/24/2020   Anemia in chronic kidney disease 03/10/2020   Benign hypertensive kidney disease with chronic kidney disease 03/10/2020   Hyposmolality and/or hyponatremia 03/10/2020   Stage 3b chronic kidney disease (HCC) 03/10/2020   Calf tenderness    Leg edema    Type 2 diabetes mellitus with hyperlipidemia (HCC) 02/26/2020   CKD (chronic kidney disease) stage 4, GFR 15-29 ml/min (HCC) 02/26/2020   Hyponatremia 02/26/2020   Anasarca 02/26/2020   Proteinuria 02/26/2020   Hypoalbuminemia 02/26/2020   Elevated troponin 02/26/2020   Acute on chronic systolic CHF (congestive heart failure) (HCC) 02/26/2020   Aortic atherosclerosis (HCC) 02/18/2020   Hyperglycemia    Nausea    Hypertensive emergency    Myalgia due to statin 12/04/2019   Hypertensive urgency 11/24/2019   Chest pain 11/23/2019   OSA (obstructive sleep apnea) 04/10/2019   Mild episode of  recurrent major depressive disorder (HCC) 04/10/2019   ACS (acute coronary syndrome) (HCC) 03/24/2019   COVID-19 virus infection 03/24/2019   Hypertensive heart/kidney disease w/chronic kidney disease stage III  (HCC) 10/21/2017   Mild nonproliferative diabetic retinopathy of right eye associated with type 2 diabetes mellitus (HCC) 09/09/2017   Genital herpes 05/07/2017   DVT (deep venous thrombosis) (HCC) 12/30/2016   History of DVT (deep vein thrombosis) 12/30/2016   Hx of CABG 2018   PVD (posterior vitreous detachment), right eye 01/09/2016   Diabetic peripheral angiopathy (HCC) 01/03/2016   Age-related nuclear cataract of both eyes 10/24/2015   Chronic nonintractable headache 10/02/2015   Dry eyes, bilateral 02/13/2015   Proptosis 12/18/2014   Iritis of right eye 12/03/2014   Anemia 08/30/2014   Depression 08/30/2014   Past Medical History:  Past Medical History:  Diagnosis Date   Anemia    Angina at rest [facility])    Angioedema 03/20/2021   Lisinopril    Anterior cerebral aneurysm    approximately 5 mm; left MCA    Anxiety    Aortic atherosclerosis (HCC)    Asthma    Blind left eye    CHF (congestive heart failure) (HCC)    COPD (chronic obstructive pulmonary disease) (HCC)    Coronary artery disease    Depression    DOE (dyspnea on exertion)    ESRD (end stage renal disease) (HCC)    Gait instability    Genital herpes    GERD (gastroesophageal reflux disease)    HFrEF (heart failure with reduced ejection fraction) (HCC)    History of 2019 novel coronavirus disease (COVID-19) 03/2019   [facility] [place]    History of DVT (deep vein thrombosis)    History of DVT (deep vein thrombosis)    Hx of CABG 2018   x 4; performed in [place]    Hyperlipidemia    Hypertension    MI (myocardial infarction) (HCC) 2018   no stents   Pituitary adenoma (HCC)    PVD (posterior vitreous detachment), right eye    Secondary hyperparathyroidism (HCC)    Sleep apnea    non-compliant with nocturnal PAP therapy   Stroke [facility]) 2014   stated affected left side.   T2DM (type 2 diabetes mellitus) [facility])    Past Surgical History:  Past Surgical History:  Procedure Laterality Date    A/V FISTULAGRAM Right 12/29/2021   Procedure: A/V FISTULAGRAM;  Surgeon: Jackquelyn Mass, MD;  Location: [facility] INVASIVE CV [facility];  Service: Cardiovascular;  Laterality: Right;   A/V FISTULAGRAM Right 06/15/2022   Procedure: A/V Fistulagram;  Surgeon: Jackquelyn Mass, MD;  Location: [facility];  Service: Cardiovascular;  Laterality: Right;   A/V FISTULAGRAM Right 04/17/2024   Procedure: A/V Fistulagram;  Surgeon: Jackquelyn Mass, MD;  Location: [facility];  Service: Cardiovascular;  Laterality: Right;   AV FISTULA PLACEMENT Right 01/28/2021   Procedure: ARTERIOVENOUS (AV) FISTULA CREATION;  Surgeon: Jackquelyn Mass, MD;  Location: [facility] ORS;  Service: Vascular;  Laterality: Right;   AV FISTULA PLACEMENT Right 05/13/2021   Procedure: INSERTION OF ARTERIOVENOUS (AV) GORE-TEX GRAFT ARM ( BRACHIAL AXILLARY );  Surgeon: Jackquelyn Mass, MD;  Location: [facility] ORS;  Service: Vascular;  Laterality: Right;   BRAIN SURGERY  1986   pituitary tumor   BREAST EXCISIONAL BIOPSY Left yrs ago   benign   CESAREAN SECTION     COLONOSCOPY N/A 05/26/2023   Procedure: COLONOSCOPY;  Surgeon: Quintin Buckle,  DO;  Location: [facility] ENDOS[facility];  Service: Gastroenterology;  Laterality: N/A;  DIALYSIS   CORONARY ARTERY BYPASS GRAFT  2018   x 4 vessels; performed in [place]    DIALYSIS/PERMA CATHETER INSERTION N/A 11/07/2020   Procedure: DIALYSIS/PERMA CATHETER INSERTION;  Surgeon: Celso College, MD;  Location: [facility];  Service: Cardiovascular;  Laterality: N/A;   DIALYSIS/PERMA CATHETER REMOVAL N/A 06/12/2021   Procedure: DIALYSIS/PERMA CATHETER REMOVAL;  Surgeon: Celso College, MD;  Location: [facility];  Service: Cardiovascular;  Laterality: N/A;   ESOPHAGOGASTRODUODENOSCOPY (EGD) WITH PROPOFOL  N/A 12/22/2021   Procedure: ESOPHAGOGASTRODUODENOSCOPY (EGD) WITH PROPOFOL ;  Surgeon: Shane Darling, MD;  Location: [facility] ENDOS[facility];  Service: Gastroenterology;  Laterality:  N/A;  IDDM   EYE SURGERY Right 2021   cataracts    IR CT HEAD LTD  04/23/2024   IR PERCUTANEOUS ART THROMBECTOMY/INFUSION INTRACRANIAL INC DIAG ANGIO  04/23/2024   IR US  GUIDE VASC ACCESS RIGHT  04/23/2024   PERIPHERAL VASCULAR THROMBECTOMY Right 04/03/2021   Procedure: A/V Fistulagram ;  Surgeon: Jackquelyn Mass, MD;  Location: [facility];  Service: Cardiovascular;  Laterality: Right;   RADIOLOGY WITH ANESTHESIA N/A 04/23/2024   Procedure: RADIOLOGY WITH ANESTHESIA;  Surgeon: Radiologist, Medication, MD;  Location: [facility] OR;  Service: Radiology;  Laterality: N/A;   HPI:  Lucrezia Back 63 year old female who was brought to the emergency department at [facility] with right arm weakness. Not a candidate for TNK and CT negative, but CTA showing severe left internal carotid artery stenosis and 2 to 3 mm left MCA saccular aneurysm.  Patient was transferred to [facility], underwent emergent left carotid artery angioplasty and balloon placement. Requried short period of ventilatory support post procedure. Hypophonia noted prior to intubation.  MRI 5/6 with no acute findings.  Pt with end-stage renal disease on hemodialysis, prior history of pituitary adenoma complicated with visual field defect, coronary artery disease status post CABG, diabetes type 2 and chronic HFrEF.   Assessment / Plan / Recommendation Clinical Impression  Pt presents with mild expressive and receptive language deficits.  She was assessed using the Western Aphasia Battery - Beside Form (see below for additional information).  Pt exhibited mild impairments with command following and repetition.  Pt's spontaneous speech was marked by repetitions and perseverations but was grammatically intact with appropriate content. Pt performed well on confrontational naming task.  Pt's writing was legible and correct with one letter reptition in sentence dictation task, and one inappropriate capitalization with spontaneous writing.    Pt's speech is hypophonic with mild articulatory imprecision and simplification of consonant clusters (ex: pt's name Janyia, sounded like Cherie). A moderate portion of sentence repetition task was unintelligible. Hypophonia noted prior to procedure and is unlikely related to intubation, but she does endorse that this is a change with this admission.  Pt benefited from requests to repeat some items, but she consistently exhibited low vocal intensity.  OME with only mild R asymmetry noted, but pt kept flat, masklike appearance to face with very little facial or mandibular movevent with speech.    Pt would benefit from ST to address the above noted deficits while in house and at next level of care.  She also endorses some changes to thinking and memory and may benefit from further assessment of cognition as well.  Western Aphasia Battery - Bedside Form Content: 10/10 Fluency: 9/10 Yes/No: 10/10 Sequential Commands: 7/10 Repetition: 7/10 Naming: 9.5/10 Bedside Aphasia Score: 87.5/100  SLP Assessment  SLP Recommendation/Assessment: Patient needs continued Speech Lanaguage Pathology Services SLP Visit Diagnosis: Dysarthria and anarthria (R47.1);Aphasia (R47.01)    Recommendations for follow up therapy are one component of a multi-disciplinary discharge planning process, led by the attending physician.  Recommendations may be updated based on patient status, additional functional criteria and insurance authorization.    Follow Up Recommendations  Acute inpatient rehab (3hours/day) (ST at next level of care, consider CIR if pt is a candidate)    Assistance Recommended at Discharge     Functional Status Assessment Patient has had a recent decline in their functional status and demonstrates the ability to make significant improvements in function in a reasonable and predictable amount of time.  Frequency and Duration min 2x/week  2 weeks      SLP Evaluation Cognition  Overall  Cognitive Status:  (Not assessed) Orientation Level: Oriented X4       Comprehension  Auditory Comprehension Overall Auditory Comprehension: Appears within functional limits for tasks assessed Yes/No Questions: Within Functional Limits Commands: Impaired Multistep Basic Commands: 50-74% accurate Conversation: Simple Visual Recognition/Discrimination Discrimination: Not tested Reading Comprehension Reading Status: Not tested    Expression Expression Primary Mode of Expression: Verbal Verbal Expression Overall Verbal Expression: Impaired Initiation: No impairment Level of Generative/Spontaneous Verbalization: Sentence Repetition: Impaired Level of Impairment: Sentence level Naming: No impairment Pragmatics: Impairment Impairments: Abnormal affect;Monotone Interfering Components: Speech intelligibility Written Expression Dominant Hand: Left Written Expression: Within Functional Limits   Oral / Motor  Oral Motor/Sensory Function Overall Oral Motor/Sensory Function: Mild impairment Facial ROM: Reduced right Facial Symmetry: Abnormal symmetry right Lingual ROM: Within Functional Limits Lingual Symmetry: Within Functional Limits Lingual Strength: Within Functional Limits Velum: Within Functional Limits Mandible: Within Functional Limits Motor Speech Overall Motor Speech: Impaired Respiration: Within functional limits Phonation: Low vocal intensity Resonance: Within functional limits Articulation: Impaired Level of Impairment: Conversation Intelligibility: Intelligibility reduced Sentence: 75-100% accurate Motor Planning: Witnin functional limits Motor Speech Errors: Not applicable            Elester Grim, MA, CCC-SLP Acute Rehabilitation Services Office: [phone] 04/24/2024, 10:41 AM

## 2024-04-24 NOTE — Consult Note (Signed)
 [facility]  INPATIENT CONSULTATION  Reason for Consultation: ESRD on HD Requesting Provider: Dr. Zaida Hertz  HPI: Terry Arroyo is an 63 y.o. female with ESRD on HD, h/o pituitary adenoma, CAD s/p CABG, type 2 DM, h/o DVT, anemia, HFrEF, COPD current admitted with a CVA and nephrology is consulted for ESRD.   Pt presented with RUE and RLE weakness, aphasia prompting ED evaluation.  She was transferred to [facility] and underwent ICA stenting and thrombectomy.  She was admitted to ICU with plans for rehab soon.   She says she has some residual R sided weakness and dysarthria but has regained a good bit of function.  Thankfully she's L handed and can feed, says cleared to eat.   She says HD usually goes fine.  RUE AVF s/p PTA last week for low flows.    She denies f/c/dyspnea/edema.   PMH: Past Medical History:  Diagnosis Date   Anemia    Angina at rest [facility])    Angioedema 03/20/2021   Lisinopril    Anterior cerebral aneurysm    approximately 5 mm; left MCA    Anxiety    Aortic atherosclerosis (HCC)    Asthma    Blind left eye    CHF (congestive heart failure) (HCC)    COPD (chronic obstructive pulmonary disease) (HCC)    Coronary artery disease    Depression    DOE (dyspnea on exertion)    ESRD (end stage renal disease) (HCC)    Gait instability    Genital herpes    GERD (gastroesophageal reflux disease)    HFrEF (heart failure with reduced ejection fraction) (HCC)    History of 2019 novel coronavirus disease (COVID-19) 03/2019   [facility]    History of DVT (deep vein thrombosis)    History of DVT (deep vein thrombosis)    Hx of CABG 2018   x 4; performed in [place]    Hyperlipidemia    Hypertension    MI (myocardial infarction) (HCC) 2018   no stents   Pituitary adenoma (HCC)    PVD (posterior vitreous detachment), right eye    Secondary hyperparathyroidism (HCC)    Sleep apnea    non-compliant with nocturnal PAP therapy   Stroke [facility])  2014   stated affected left side.   T2DM (type 2 diabetes mellitus) (HCC)    PSH: Past Surgical History:  Procedure Laterality Date   A/V FISTULAGRAM Right 12/29/2021   Procedure: A/V FISTULAGRAM;  Surgeon: Jackquelyn Mass, MD;  Location: [facility] INVASIVE CV LAB;  Service: Cardiovascular;  Laterality: Right;   A/V FISTULAGRAM Right 06/15/2022   Procedure: A/V Fistulagram;  Surgeon: Jackquelyn Mass, MD;  Location: [facility] INVASIVE CV LAB;  Service: Cardiovascular;  Laterality: Right;   A/V FISTULAGRAM Right 04/17/2024   Procedure: A/V Fistulagram;  Surgeon: Jackquelyn Mass, MD;  Location: [facility] INVASIVE CV LAB;  Service: Cardiovascular;  Laterality: Right;   AV FISTULA PLACEMENT Right 01/28/2021   Procedure: ARTERIOVENOUS (AV) FISTULA CREATION;  Surgeon: Jackquelyn Mass, MD;  Location: [facility] ORS;  Service: Vascular;  Laterality: Right;   AV FISTULA PLACEMENT Right 05/13/2021   Procedure: INSERTION OF ARTERIOVENOUS (AV) GORE-TEX GRAFT ARM ( BRACHIAL AXILLARY );  Surgeon: Jackquelyn Mass, MD;  Location: [facility] ORS;  Service: Vascular;  Laterality: Right;   BRAIN SURGERY  1986   pituitary tumor   BREAST EXCISIONAL BIOPSY Left yrs ago   benign   CESAREAN SECTION     COLONOSCOPY N/A 05/26/2023  Procedure: COLONOSCOPY;  Surgeon: Quintin Buckle, DO;  Location: [facility] ENDOSCOPY;  Service: Gastroenterology;  Laterality: N/A;  DIALYSIS   CORONARY ARTERY BYPASS GRAFT  2018   x 4 vessels; performed in [place]    DIALYSIS/PERMA CATHETER INSERTION N/A 11/07/2020   Procedure: DIALYSIS/PERMA CATHETER INSERTION;  Surgeon: Celso College, MD;  Location: [facility] INVASIVE CV LAB;  Service: Cardiovascular;  Laterality: N/A;   DIALYSIS/PERMA CATHETER REMOVAL N/A 06/12/2021   Procedure: DIALYSIS/PERMA CATHETER REMOVAL;  Surgeon: Celso College, MD;  Location: [facility] INVASIVE CV LAB;  Service: Cardiovascular;  Laterality: N/A;   ESOPHAGOGASTRODUODENOSCOPY (EGD) WITH PROPOFOL  N/A 12/22/2021   Procedure:  ESOPHAGOGASTRODUODENOSCOPY (EGD) WITH PROPOFOL ;  Surgeon: Shane Darling, MD;  Location: [facility] ENDOSCOPY;  Service: Gastroenterology;  Laterality: N/A;  IDDM   EYE SURGERY Right 2021   cataracts    IR CT HEAD LTD  04/23/2024   IR PERCUTANEOUS ART THROMBECTOMY/INFUSION INTRACRANIAL INC DIAG ANGIO  04/23/2024   IR US  GUIDE VASC ACCESS RIGHT  04/23/2024   PERIPHERAL VASCULAR THROMBECTOMY Right 04/03/2021   Procedure: A/V Fistulagram ;  Surgeon: Jackquelyn Mass, MD;  Location: [facility] INVASIVE CV LAB;  Service: Cardiovascular;  Laterality: Right;   RADIOLOGY WITH ANESTHESIA N/A 04/23/2024   Procedure: RADIOLOGY WITH ANESTHESIA;  Surgeon: Radiologist, Medication, MD;  Location: [facility] OR;  Service: Radiology;  Laterality: N/A;   Past Medical History:  Diagnosis Date   Anemia    Angina at rest [facility])    Angioedema 03/20/2021   Lisinopril    Anterior cerebral aneurysm    approximately 5 mm; left MCA    Anxiety    Aortic atherosclerosis (HCC)    Asthma    Blind left eye    CHF (congestive heart failure) (HCC)    COPD (chronic obstructive pulmonary disease) (HCC)    Coronary artery disease    Depression    DOE (dyspnea on exertion)    ESRD (end stage renal disease) (HCC)    Gait instability    Genital herpes    GERD (gastroesophageal reflux disease)    HFrEF (heart failure with reduced ejection fraction) (HCC)    History of 2019 novel coronavirus disease (COVID-19) 03/2019   [facility]    History of DVT (deep vein thrombosis)    History of DVT (deep vein thrombosis)    Hx of CABG 2018   x 4; performed in [place]    Hyperlipidemia    Hypertension    MI (myocardial infarction) (HCC) 2018   no stents   Pituitary adenoma (HCC)    PVD (posterior vitreous detachment), right eye    Secondary hyperparathyroidism (HCC)    Sleep apnea    non-compliant with nocturnal PAP therapy   Stroke [facility]) 2014   stated affected left side.   T2DM (type 2 diabetes mellitus) (HCC)      Medications:  I have reviewed the patient's current medications.  Medications Prior to Admission  Medication Sig Dispense Refill   acetaminophen  (TYLENOL ) 500 MG tablet Take 1,000 mg by mouth every 6 (six) hours as needed for mild pain (pain score 1-3) or moderate pain (pain score 4-6).     albuterol  (VENTOLIN  HFA) 108 (90 Base) MCG/ACT inhaler Inhale 2 puffs into the lungs every 6 (six) hours as needed for wheezing or shortness of breath.     amLODipine  (NORVASC ) 10 MG tablet Take 1 tablet (10 mg total) by mouth daily. 30 tablet 0   aspirin  81 MG EC tablet Take 1 tablet (81  mg total) by mouth daily. 30 tablet 0   atorvastatin  (LIPITOR ) 80 MG tablet Take 1 tablet (80 mg total) by mouth daily at 6 PM. 30 tablet 0   B Complex-C-Folic Acid  (RENA-VITE RX) 1 MG TABS Take 1 tablet by mouth daily.     carvedilol  (COREG ) 25 MG tablet Take 25 mg by mouth 2 (two) times daily.     gabapentin  (NEURONTIN ) 300 MG capsule Take 300 mg by mouth at bedtime.     lidocaine -prilocaine  (EMLA ) cream Apply 1 Application topically every Monday, Wednesday, and Friday with hemodialysis.     mirtazapine  (REMERON ) 30 MG tablet Take 30 mg by mouth at bedtime.     NOVOLOG  MIX 70/30 FLEXPEN (70-30) 100 UNIT/ML FlexPen Inject 15 Units into the skin 3 (three) times daily with meals.     topiramate  (TOPAMAX ) 50 MG tablet Take 50 mg by mouth daily.     traZODone  (DESYREL ) 50 MG tablet Take 0.5 tablets (25 mg total) by mouth at bedtime. (Patient taking differently: Take 50 mg by mouth at bedtime.)     vitamin B-12 (CYANOCOBALAMIN ) 1000 MCG tablet Take 1,000 mcg by mouth daily.     Blood Glucose Monitoring Suppl (TRUE METRIX METER) w/Device KIT      methocarbamol  (ROBAXIN ) 500 MG tablet Take 1 tablet (500 mg total) by mouth 2 (two) times daily as needed for muscle spasms. (Patient not taking: Reported on 04/23/2024) 20 tablet 0   TRUE METRIX BLOOD GLUCOSE TEST test strip       ALLERGIES:   Allergies  Allergen Reactions    Hydralazine  Hives, Shortness Of Breath, Swelling and Rash    Body aches, also    Kiwi Extract Hives, Swelling, Rash and Other (See Comments)    Mouth swells    Lisinopril  Swelling and Other (See Comments)    Angioedema   Shellfish Allergy Anaphylaxis and Other (See Comments)    Shrimp/lobster    Betadine [Povidone Iodine ] Hives and Rash    LEAVES WELTS ON SKIN   Septra [Sulfamethoxazole-Trimethoprim] Other (See Comments)    Reaction not stated   Metformin Diarrhea and Other (See Comments)    GI Intolerance   Tape Itching    Use paper tape whenever possible    FAM HX: Family History  Problem Relation Age of Onset   CAD Mother    CAD Brother    Breast cancer Sister 42    Social History:   reports that she quit smoking about 7 years ago. Her smoking use included cigarettes. She has never used smokeless tobacco. She reports that she does not currently use alcohol . She reports that she does not currently use drugs.  ROS: 12 system ROS neg except per HPI above  Blood pressure (!) 124/47, pulse (!) 58, temperature 98 F (36.7 C), temperature source Oral, resp. rate 20, SpO2 92%. PHYSICAL EXAM: Gen: calm in bed  Eyes: EOMI ENT:MMM Neck: supple CV:  sinus brady on monitor Abd: soft Lungs: clear on 2L [place] Extr: RUE AVF +t/b, no LE edema Neuro:awake, conversant, dysarthria but able to communicate, 4/5 strength R upper and lower, L ok Skin: no rashes   Results for orders placed or performed during the hospital encounter of 04/23/24 (from the past 48 hours)  MRSA Next Gen by PCR, Nasal     Status: None   Collection Time: 04/23/24  4:13 PM   Specimen: Nasal Mucosa; Nasal Swab  Result Value Ref Range   MRSA by PCR Next Gen NOT DETECTED NOT  DETECTED    Comment: (NOTE) The GeneXpert MRSA Assay (FDA approved for NASAL specimens only), is one component of a comprehensive MRSA colonization surveillance program. It is not intended to diagnose MRSA infection nor to guide or monitor  treatment for MRSA infections. Test performance is not FDA approved in patients less than 59 years old. Performed at [facility], [address], [place], [place] [address]   Hemoglobin A1c     Status: Abnormal   Collection Time: 04/23/24  7:27 PM  Result Value Ref Range   Hgb A1c MFr Bld 8.6 (H) 4.8 - 5.6 %    Comment: (NOTE) Pre diabetes:          5.7%-6.4%  Diabetes:              >6.4%  Glycemic control for   <7.0% adults with diabetes    Mean Plasma Glucose 200.12 mg/dL    Comment: Performed at [facility], [address] [place], [place] [address]  Glucose, capillary     Status: Abnormal   Collection Time: 04/23/24  7:34 PM  Result Value Ref Range   Glucose-Capillary 154 (H) 70 - 99 mg/dL    Comment: Glucose reference range applies only to samples taken after fasting for at least 8 hours.  Glucose, capillary     Status: None   Collection Time: 04/23/24 11:29 PM  Result Value Ref Range   Glucose-Capillary 85 70 - 99 mg/dL    Comment: Glucose reference range applies only to samples taken after fasting for at least 8 hours.  Glucose, capillary     Status: None   Collection Time: 04/24/24  3:42 AM  Result Value Ref Range   Glucose-Capillary 72 70 - 99 mg/dL    Comment: Glucose reference range applies only to samples taken after fasting for at least 8 hours.  Lipid panel     Status: Abnormal   Collection Time: 04/24/24  6:16 AM  Result Value Ref Range   Cholesterol 137 0 - 200 mg/dL   Triglycerides 098 <119 mg/dL   HDL 33 (L) >14 mg/dL   Total CHOL/HDL Ratio 4.2 RATIO   VLDL 29 0 - 40 mg/dL   LDL Cholesterol 75 0 - 99 mg/dL    Comment:        Total Cholesterol/HDL:CHD Risk Coronary Heart Disease Risk Table                     Men   Women  1/2 Average Risk   3.4   3.3  Average Risk       5.0   4.4  2 X Average Risk   9.6   7.1  3 X Average Risk  23.4   11.0        Use the calculated Patient Ratio above and the CHD Risk Table to determine the  patient's CHD Risk.        ATP III CLASSIFICATION (LDL):  <100     mg/dL   Optimal  782-956  mg/dL   Near or Above                    Optimal  130-159  mg/dL   Borderline  213-086  mg/dL   High  >578     mg/dL   Very High Performed at [facility], [address], [place], [place] [address]   CBC     Status: Abnormal   Collection Time:  04/24/24  6:16 AM  Result Value Ref Range   WBC 9.6 4.0 - 10.5 K/uL   RBC 3.76 (L) 3.87 - 5.11 MIL/uL   Hemoglobin 11.2 (L) 12.0 - 15.0 g/dL   HCT 95.6 (L) 21.3 - 08.6 %   MCV 90.7 80.0 - 100.0 fL   MCH 29.8 26.0 - 34.0 pg   MCHC 32.8 30.0 - 36.0 g/dL   RDW 57.8 46.9 - 62.9 %   Platelets 186 150 - 400 K/uL   nRBC 0.0 0.0 - 0.2 %    Comment: Performed at [facility], [address] [place], [place] [address]  Comprehensive metabolic panel     Status: Abnormal   Collection Time: 04/24/24  6:16 AM  Result Value Ref Range   Sodium 134 (L) 135 - 145 mmol/L   Potassium 4.8 3.5 - 5.1 mmol/L   Chloride 100 98 - 111 mmol/L   CO2 18 (L) 22 - 32 mmol/L   Glucose, Bld 73 70 - 99 mg/dL    Comment: Glucose reference range applies only to samples taken after fasting for at least 8 hours.   BUN 75 (H) 8 - 23 mg/dL   Creatinine, Ser 32.44 (H) 0.44 - 1.00 mg/dL   Calcium  8.1 (L) 8.9 - 10.3 mg/dL   Total Protein 7.2 6.5 - 8.1 g/dL   Albumin  3.4 (L) 3.5 - 5.0 g/dL   AST 11 (L) 15 - 41 U/L   ALT 9 0 - 44 U/L   Alkaline Phosphatase 74 38 - 126 U/L   Total Bilirubin 0.9 0.0 - 1.2 mg/dL   GFR, Estimated 4 (L) >60 mL/min    Comment: (NOTE) Calculated using the CKD-EPI Creatinine Equation (2021)    Anion gap 16 (H) 5 - 15    Comment: Performed at [facility], [address] [place], [place] [address]  Protime-INR     Status: None   Collection Time: 04/24/24  6:16 AM  Result Value Ref Range   Prothrombin Time 14.8 11.4 - 15.2 seconds   INR 1.1 0.8 - 1.2    Comment: (NOTE) INR goal varies based on device and disease states. Performed  at [facility], [address], [place], [place] [address]   Glucose, capillary     Status: Abnormal   Collection Time: 04/24/24  7:46 AM  Result Value Ref Range   Glucose-Capillary 66 (L) 70 - 99 mg/dL    Comment: Glucose reference range applies only to samples taken after fasting for at least 8 hours.  Glucose, capillary     Status: None   Collection Time: 04/24/24  9:15 AM  Result Value Ref Range   Glucose-Capillary 84 70 - 99 mg/dL    Comment: Glucose reference range applies only to samples taken after fasting for at least 8 hours.  Glucose, capillary     Status: Abnormal   Collection Time: 04/24/24 11:51 AM  Result Value Ref Range   Glucose-Capillary 153 (H) 70 - 99 mg/dL    Comment: Glucose reference range applies only to samples taken after fasting for at least 8 hours.    MR BRAIN WO CONTRAST Result Date: 04/24/2024 CLINICAL DATA:  Stroke follow-up EXAM: MRI HEAD WITHOUT CONTRAST TECHNIQUE: Multiplanar, multiecho pulse sequences of the brain and surrounding structures were obtained without intravenous contrast. COMPARISON:  12/07/2023 FINDINGS: Brain: No acute infarction, hemorrhage, hydrocephalus, extra-axial collection or mass lesion. Chronic generalized dural thickening with calcification by CT. Vascular: Diffusely attenuated superior sagittal  sinus with absent flow void continuing into the left sigmoid sinus, chronic partial thrombosis by prior enhanced imaging with prominent venous collaterals along the right inferior convexity. Chronic thrombus associated with calcifications. Skull and upper cervical spine: Diffusely hypointense marrow signal. No focal lesion seen. Sinuses/Orbits: Prior endoscopic sinus surgery. Right proptosis not associated with acute inflammation, stable from 2024. Nasopharyngeal retention cysts. IMPRESSION: No acute finding.  Negative for acute infarct. Chronic dural venous sinus thrombosis with collaterals. Electronically Signed   By: Ronnette Coke  M.D.   On: 04/24/2024 06:03   IR PERCUTANEOUS ART THROMBECTOMY/INFUSION INTRACRANIAL INC DIAG ANGIO Result Date: 04/23/2024 PROCEDURE PERFORMED: 1. Stroke thrombectomy 2. Ultrasound vascular access 3. Cone beam CT for treatment planning COMPARISON:  CT perfusion performed Apr 23, 2024. CLINICAL DATA:  63 year old female with acute ischemic stroke with symptoms of right motor dysfunction sensory deficits and dysarthria with an NIH stroke scale measuring 14. CT imaging demonstrated a severe proximal left internal carotid artery stenosis and perfusion imaging suggested hypoperfusion to the left hemisphere. After discussing the case in a multidisciplinary fashion, and confirming that there was no immediate available decision maker, the patient was transferred for thrombectomy and emergent 2 physician consent was documented. INDICATION: Acute ischemic ischemic stroke with left hemi severe hypoperfusion. ANESTHESIA/SEDATION: General anesthesia was utilized for the procedure. CONTRAST:  Approximately 75 cc Omnipaque  300 320 MEDICATIONS: Heparin  6000 units intravenously Brilinta 180 mg per tube FLUOROSCOPY TIME:  Fluoroscopy Time: 15.8 minutes (737 mGy). COMPLICATIONS: None immediate. BODY OF REPORT: The patient was then put under general anesthesia by the Department of Anesthesiology at [facility]. Insert ultrasound access The right groin was prepped and draped in the usual sterile fashion. Ultrasound was used to study the right common femoral artery which was patent. Using real-time ultrasound guidance, a 19 gauge introducer needle was used to access the right common femoral artery. Access was performed at 1357. A hard copy image ultrasound the same date uncertain PACS. Using this access, a 6 French sheath was placed in the descending thoracic aorta. Approximately 5000 units of heparin  was administered intravenously. Next, selective catheterization the left common carotid artery was performed. A selective  arteriogram was performed which demonstrated that the internal carotid artery was patent. But demonstrated a severe focal stenosis estimated at greater than 80% in the single frontal plane of imaging. This was use a lies as it best profiled the bifurcation between the internal carotid artery and external carotid artery. There was no evidence of intracranial filling defect. Scattered mild diffuse atherosclerotic changes are present. Next, the lesion was crossed with a catheter and guidewire. At this point, my tempted to deployed embolic protection device, however, I could not safely deployed the device in the proximal internal carotid artery secondary to tortuosity and concern for device detachment. Therefore, the proximal stenosis was treated with balloon angioplasty using a 4 mm noncompliant balloon. This was considered the first pass for the purposes of documentation and this was performed at 14:20. Next, a precise 6 mm x 40 mm self expanding stent was deployed in the proximal left internal carotid artery extending into the common carotid artery. Deployment was completed at 14:50 and this was considered recanalization. There was a residual waist and therefore stent dilation was performed using a 5 mm noncompliant balloon. A post treatment arteriogram demonstrated no evidence of significant residual proximal stenosis. There was good apposition of the bare metal stent. There was persistent filling of the left external carotid artery. The intracranial arteries were improved  in terms of caliber. There was no evidence of filling defect to suggest peripheral embolus. I elected to terminate the procedure at this point. The revascularization score was graded at TICI-3 Evaluation of the right femoral access site demonstrated at this site was suitable for closure device. A 6 French Angio-Seal device was deployed without complication. Become beam CT was then performed to evaluate for intracranial hemorrhage and treatment  planning. This demonstrated no evidence significant acute intracranial hemorrhage. Fluoroscopic imaging confirmed placement of an enteric tube within the stomach. At this point, 180 mg of Brilinta was administered per tube. The patient was then transferred to the ICU in stable condition while remaining intubated. IMPRESSION: 1. Emergent left internal carotid artery stent placement due to clinical concern for is global left-sided hypoperfusion and developing stroke in the setting of severe left internal carotid artery stenosis. PLAN: 1. To ICU for routine postoperative supportive care. 2. The patient will continue on a heparin  infusion intravenously at 500 units/hour for the next 6 hours. At that point, heparin  will be discontinued. 3. The patient has received a bolus dose of aspirin  PR and a bolus dose of 180 mg of Brilinta per tube. Electronically Signed   By: Reagan Camera M.D.   On: 04/23/2024 16:31   IR US  Guide Vasc Access Right Result Date: 04/23/2024 PROCEDURE PERFORMED: 1. Stroke thrombectomy 2. Ultrasound vascular access 3. Cone beam CT for treatment planning COMPARISON:  CT perfusion performed Apr 23, 2024. CLINICAL DATA:  63 year old female with acute ischemic stroke with symptoms of right motor dysfunction sensory deficits and dysarthria with an NIH stroke scale measuring 14. CT imaging demonstrated a severe proximal left internal carotid artery stenosis and perfusion imaging suggested hypoperfusion to the left hemisphere. After discussing the case in a multidisciplinary fashion, and confirming that there was no immediate available decision maker, the patient was transferred for thrombectomy and emergent 2 physician consent was documented. INDICATION: Acute ischemic ischemic stroke with left hemi severe hypoperfusion. ANESTHESIA/SEDATION: General anesthesia was utilized for the procedure. CONTRAST:  Approximately 75 cc Omnipaque  300 320 MEDICATIONS: Heparin  6000 units intravenously Brilinta 180 mg per  tube FLUOROSCOPY TIME:  Fluoroscopy Time: 15.8 minutes (737 mGy). COMPLICATIONS: None immediate. BODY OF REPORT: The patient was then put under general anesthesia by the Department of Anesthesiology at [facility]. Insert ultrasound access The right groin was prepped and draped in the usual sterile fashion. Ultrasound was used to study the right common femoral artery which was patent. Using real-time ultrasound guidance, a 19 gauge introducer needle was used to access the right common femoral artery. Access was performed at 1357. A hard copy image ultrasound the same date uncertain PACS. Using this access, a 6 French sheath was placed in the descending thoracic aorta. Approximately 5000 units of heparin  was administered intravenously. Next, selective catheterization the left common carotid artery was performed. A selective arteriogram was performed which demonstrated that the internal carotid artery was patent. But demonstrated a severe focal stenosis estimated at greater than 80% in the single frontal plane of imaging. This was use a lies as it best profiled the bifurcation between the internal carotid artery and external carotid artery. There was no evidence of intracranial filling defect. Scattered mild diffuse atherosclerotic changes are present. Next, the lesion was crossed with a catheter and guidewire. At this point, my tempted to deployed embolic protection device, however, I could not safely deployed the device in the proximal internal carotid artery secondary to tortuosity and concern for device detachment. Therefore,  the proximal stenosis was treated with balloon angioplasty using a 4 mm noncompliant balloon. This was considered the first pass for the purposes of documentation and this was performed at 14:20. Next, a precise 6 mm x 40 mm self expanding stent was deployed in the proximal left internal carotid artery extending into the common carotid artery. Deployment was completed at 14:50 and  this was considered recanalization. There was a residual waist and therefore stent dilation was performed using a 5 mm noncompliant balloon. A post treatment arteriogram demonstrated no evidence of significant residual proximal stenosis. There was good apposition of the bare metal stent. There was persistent filling of the left external carotid artery. The intracranial arteries were improved in terms of caliber. There was no evidence of filling defect to suggest peripheral embolus. I elected to terminate the procedure at this point. The revascularization score was graded at TICI-3 Evaluation of the right femoral access site demonstrated at this site was suitable for closure device. A 6 French Angio-Seal device was deployed without complication. Become beam CT was then performed to evaluate for intracranial hemorrhage and treatment planning. This demonstrated no evidence significant acute intracranial hemorrhage. Fluoroscopic imaging confirmed placement of an enteric tube within the stomach. At this point, 180 mg of Brilinta was administered per tube. The patient was then transferred to the ICU in stable condition while remaining intubated. IMPRESSION: 1. Emergent left internal carotid artery stent placement due to clinical concern for is global left-sided hypoperfusion and developing stroke in the setting of severe left internal carotid artery stenosis. PLAN: 1. To ICU for routine postoperative supportive care. 2. The patient will continue on a heparin  infusion intravenously at 500 units/hour for the next 6 hours. At that point, heparin  will be discontinued. 3. The patient has received a bolus dose of aspirin  PR and a bolus dose of 180 mg of Brilinta per tube. Electronically Signed   By: Reagan Camera M.D.   On: 04/23/2024 16:31   IR CT Head Ltd Result Date: 04/23/2024 PROCEDURE PERFORMED: 1. Stroke thrombectomy 2. Ultrasound vascular access 3. Cone beam CT for treatment planning COMPARISON:  CT perfusion performed  Apr 23, 2024. CLINICAL DATA:  63 year old female with acute ischemic stroke with symptoms of right motor dysfunction sensory deficits and dysarthria with an NIH stroke scale measuring 14. CT imaging demonstrated a severe proximal left internal carotid artery stenosis and perfusion imaging suggested hypoperfusion to the left hemisphere. After discussing the case in a multidisciplinary fashion, and confirming that there was no immediate available decision maker, the patient was transferred for thrombectomy and emergent 2 physician consent was documented. INDICATION: Acute ischemic ischemic stroke with left hemi severe hypoperfusion. ANESTHESIA/SEDATION: General anesthesia was utilized for the procedure. CONTRAST:  Approximately 75 cc Omnipaque  300 320 MEDICATIONS: Heparin  6000 units intravenously Brilinta 180 mg per tube FLUOROSCOPY TIME:  Fluoroscopy Time: 15.8 minutes (737 mGy). COMPLICATIONS: None immediate. BODY OF REPORT: The patient was then put under general anesthesia by the Department of Anesthesiology at [facility]. Insert ultrasound access The right groin was prepped and draped in the usual sterile fashion. Ultrasound was used to study the right common femoral artery which was patent. Using real-time ultrasound guidance, a 19 gauge introducer needle was used to access the right common femoral artery. Access was performed at 1357. A hard copy image ultrasound the same date uncertain PACS. Using this access, a 6 French sheath was placed in the descending thoracic aorta. Approximately 5000 units of heparin  was administered intravenously. Next, selective  catheterization the left common carotid artery was performed. A selective arteriogram was performed which demonstrated that the internal carotid artery was patent. But demonstrated a severe focal stenosis estimated at greater than 80% in the single frontal plane of imaging. This was use a lies as it best profiled the bifurcation between the internal  carotid artery and external carotid artery. There was no evidence of intracranial filling defect. Scattered mild diffuse atherosclerotic changes are present. Next, the lesion was crossed with a catheter and guidewire. At this point, my tempted to deployed embolic protection device, however, I could not safely deployed the device in the proximal internal carotid artery secondary to tortuosity and concern for device detachment. Therefore, the proximal stenosis was treated with balloon angioplasty using a 4 mm noncompliant balloon. This was considered the first pass for the purposes of documentation and this was performed at 14:20. Next, a precise 6 mm x 40 mm self expanding stent was deployed in the proximal left internal carotid artery extending into the common carotid artery. Deployment was completed at 14:50 and this was considered recanalization. There was a residual waist and therefore stent dilation was performed using a 5 mm noncompliant balloon. A post treatment arteriogram demonstrated no evidence of significant residual proximal stenosis. There was good apposition of the bare metal stent. There was persistent filling of the left external carotid artery. The intracranial arteries were improved in terms of caliber. There was no evidence of filling defect to suggest peripheral embolus. I elected to terminate the procedure at this point. The revascularization score was graded at TICI-3 Evaluation of the right femoral access site demonstrated at this site was suitable for closure device. A 6 French Angio-Seal device was deployed without complication. Become beam CT was then performed to evaluate for intracranial hemorrhage and treatment planning. This demonstrated no evidence significant acute intracranial hemorrhage. Fluoroscopic imaging confirmed placement of an enteric tube within the stomach. At this point, 180 mg of Brilinta was administered per tube. The patient was then transferred to the ICU in stable  condition while remaining intubated. IMPRESSION: 1. Emergent left internal carotid artery stent placement due to clinical concern for is global left-sided hypoperfusion and developing stroke in the setting of severe left internal carotid artery stenosis. PLAN: 1. To ICU for routine postoperative supportive care. 2. The patient will continue on a heparin  infusion intravenously at 500 units/hour for the next 6 hours. At that point, heparin  will be discontinued. 3. The patient has received a bolus dose of aspirin  PR and a bolus dose of 180 mg of Brilinta per tube. Electronically Signed   By: Reagan Camera M.D.   On: 04/23/2024 16:31   CT CEREBRAL PERFUSION W CONTRAST Result Date: 04/23/2024 CLINICAL DATA:  63 year old female code stroke presentation, profound unilateral lower extremity weakness. EXAM: CT PERFUSION BRAIN TECHNIQUE: Multiphase CT imaging of the brain was performed following IV bolus contrast injection. Subsequent parametric perfusion maps were calculated using RAPID software. RADIATION DOSE REDUCTION: This exam was performed according to the departmental dose-optimization program which includes automated exposure control, adjustment of the mA and/or kV according to patient size and/or use of iterative reconstruction technique. CONTRAST:  50mL OMNIPAQUE  IOHEXOL  350 MG/ML SOLN COMPARISON:  CTA 1207 hours today. FINDINGS: FINDINGS ASPECTS: 10 CBF (<30%) Volume: 0mL. No CBF ir CBV parameter abnormality detected. Perfusion (Tmax>6.0s) volume: 0mL however, 88 mL of T-max greater than 4s is detected which is mostly (although not entirely) in the left hemisphere. Mismatch Volume: Not applicablemL Infarction Location:Not applicable IMPRESSION:  1. NO infarct core or penumbra detected by standard CTP parameters. 2. Suggestion of asymmetric oligemia in the left hemisphere (T-max > 4s), which might relate to the severe ICA stenoses described on CTA today. Electronically Signed   By: Marlise Simpers M.D.   On: 04/23/2024  13:04   CT Angio Chest/Abd/Pel for Dissection W and/or W/WO Result Date: 04/23/2024 CLINICAL DATA:  Weakness and back pain. Acute aortic syndrome suspected. EXAM: CT ANGIOGRAPHY CHEST, ABDOMEN AND PELVIS TECHNIQUE: Non-contrast CT of the chest was initially obtained. Multidetector CT imaging through the chest, abdomen and pelvis was performed using the standard protocol during bolus administration of intravenous contrast. Multiplanar reconstructed images and MIPs were obtained and reviewed to evaluate the vascular anatomy. RADIATION DOSE REDUCTION: This exam was performed according to the departmental dose-optimization program which includes automated exposure control, adjustment of the mA and/or kV according to patient size and/or use of iterative reconstruction technique. CONTRAST:  OMNIPAQUE  IOHEXOL  350 MG/ML SOLN COMPARISON:  CT abdomen pelvis dated 02/09/2022. FINDINGS: Evaluation is limited due to streak artifact caused by patient's arms and overlying support wires. CTA CHEST FINDINGS Cardiovascular: Mild cardiomegaly. No pericardial effusion. There is mild atherosclerotic calcification of the thoracic aorta. No aneurysmal dilatation or dissection. The origins of the great vessels of the aortic arch appear patent. No pulmonary artery embolus identified. Mediastinum/Nodes: No hilar or mediastinal adenopathy. There is circumferential thickening of the mid esophagus concerning for esophagitis. Clinical correlation is recommended. An infiltrative process is not excluded. No mediastinal fluid collection. Lungs/Pleura: There are minimal bibasilar linear atelectasis. No focal consolidation, pleural effusion, or pneumothorax. The central airways are patent. Musculoskeletal: Median sternotomy wires. No acute osseous pathology. Review of the MIP images confirms the above findings. CTA ABDOMEN AND PELVIS FINDINGS VASCULAR Aorta: Advanced atherosclerotic calcification of the abdominal aorta. No aneurysmal  dilatation or dissection. No periaortic fluid collection. Celiac: Atherosclerotic calcification of the origin of the celiac artery. The celiac artery and its major branches are patent. SMA: The SMA is patent. Renals: There is atherosclerotic calcification of the origins of the renal arteries with luminal narrowing. There is diminished flow in the renal arteries bilaterally. The renal arteries however remain patent. IMA: Atherosclerotic calcification of the IMA with diminished flow. Focal area of high-grade narrowing of the proximal IMA with reconstitution of flow distal to this segment. Inflow: Advanced atherosclerotic calcification of the iliac arteries. The iliac arteries are patent. Veins: The IVC is unremarkable.  No portal venous gas. Review of the MIP images confirms the above findings. NON-VASCULAR No intra-abdominal free air.  Small perihepatic ascites. Hepatobiliary: The liver is unremarkable. No biliary dilatation. The gallbladder is unremarkable. Pancreas: Unremarkable. No pancreatic ductal dilatation or surrounding inflammatory changes. Spleen: Normal in size without focal abnormality. Adrenals/Urinary Tract: The adrenal glands are unremarkable. Moderate bilateral renal parenchyma atrophy. No hydronephrosis. There is stranding of the perinephric and renal sinus fat. Correlation with urinalysis recommended to exclude UTI. The urinary bladder is collapsed. Stomach/Bowel: Mild sigmoid diverticulosis. There is no bowel obstruction or active inflammation. The appendix is normal. Lymphatic: No adenopathy. Reproductive: The uterus is grossly unremarkable. No suspicious adnexal masses. Other: None Musculoskeletal: No acute or significant osseous findings. Review of the MIP images confirms the above findings. IMPRESSION: 1. No aortic aneurysm or dissection. 2. Circumferential thickening of the mid esophagus concerning for esophagitis. Clinical correlation is recommended. 3. Mild sigmoid diverticulosis. No bowel  obstruction. Normal appendix. 4. Moderate bilateral renal parenchyma atrophy. No hydronephrosis. Correlation with urinalysis recommended to exclude UTI.  Electronically Signed   By: Angus Bark M.D.   On: 04/23/2024 12:44   CT ANGIO HEAD NECK W WO CM (CODE STROKE) Result Date: 04/23/2024 CLINICAL DATA:  63 year old female code stroke presentation. Possible left MCA syndrome. Dialysis patient. EXAM: CT ANGIOGRAPHY HEAD AND NECK WITH AND WITHOUT CONTRAST TECHNIQUE: Multidetector CT imaging of the head and neck was performed using the standard protocol during bolus administration of intravenous contrast. Multiplanar CT image reconstructions and MIPs were obtained to evaluate the vascular anatomy. Carotid stenosis measurements (when applicable) are obtained utilizing NASCET criteria, using the distal internal carotid diameter as the denominator. RADIATION DOSE REDUCTION: This exam was performed according to the departmental dose-optimization program which includes automated exposure control, adjustment of the mA and/or kV according to patient size and/or use of iterative reconstruction technique. CONTRAST:  50mL OMNIPAQUE  IOHEXOL  350 MG/ML SOLN COMPARISON:  Head CT 1158 hours today. FINDINGS: CTA NECK Skeleton: Reversal of the normal cervical lordosis. Previous sternotomy. Mostly absent dentition. Upper thoracic spina bifida occulta. No acute osseous abnormality identified. Upper chest: Previous CABG. Other neck: Partially retropharyngeal carotids. Nonvascular neck soft tissue spaces are within normal limits. Aortic arch: Calcified aortic atherosclerosis. Bovine arch configuration. Right carotid system: Brachiocephalic artery and proximal right CCA are patent with mild plaque and no stenosis. Tortuous, partially retropharyngeal right CCA. Calcified plaque at the right ICA origin without significant stenosis. Soft and calcified plaque at the bulb or just distal to the bulb with high-grade stenosis (series 2, image  94 and series 6, image 68) approaching a radiographic string sign, numerically estimated at 70-80 % with respect to the distal vessel. The vessel remains patent to the skull base. Left carotid system: Left CCA origin plaque with less than 50% stenosis. Tortuous left CCA. Soft and calcified plaque before the bifurcation. Severe plaque at the left carotid bifurcation with radiographic string sign stenosis series 5, image 133. But the vessel remains patent. Vertebral arteries: Right subclavian origin calcified plaque without stenosis. Diminutive and atherosclerotic right vertebral artery origin and V1 segment with at least moderate stenosis. Intermittent right V2 segment plaque, but the right vertebral artery caliber improves to the skull base with no additional significant stenosis. Left subclavian origin plaque without stenosis. Calcified plaque at the left vertebral artery origin with similar mild to moderate stenosis. Codominant left vertebral artery with intermittent V2 and V3 segment plaque but no other significant stenosis to the skull base. CTA HEAD Posterior circulation: Atherosclerotic distal vertebral arteries and vertebrobasilar junction. Dominant left V4. PICA origins remain patent. Mild distal vertebral and vertebrobasilar stenosis. Patent basilar artery without stenosis. Patent SCA and PCA origins. Small posterior communicating arteries. Bilateral PCA branches are within normal limits. Anterior circulation:  Both ICA siphons are patent. Severe calcified atherosclerosis and stenosis of the cavernous left ICA on series 6, image 102, string sign stenosis. Moderate additional left supraclinoid plaque. The vessel remains patent. Left posterior communicating artery origin is normal. Similar severe calcified right siphon plaque and stenosis. Distal right ICA remains patent with moderate additional supraclinoid plaque and stenosis. Patent carotid termini, MCA and ACA origins. Normal anterior communicating  artery. Bilateral ACA branches are within normal limits. Right MCA M1 segment and bifurcation are patent without stenosis. No right MCA branch occlusion is identified. Left MCA M1 segment is patent without stenosis. There is a saccular 2-3 mm left MCA bifurcation aneurysm on series 6, image 112, directed anteriorly and laterally. Left MCA bifurcation remains patent. No left MCA branch occlusion is identified. Venous sinuses: Not  evaluated due to early contrast timing. Anatomic variants: Dominant left vertebral artery. Review of the MIP images confirms the above findings IMPRESSION: 1. Negative for large vessel occlusion, But Positive for VERY SEVERE underlying atherosclerosis with: - LICA origin and bilateral ICA siphon RADIOGRAPHIC STRING SIGN STENOSES. - Right ICA distal bulb high-grade stenosis, estimated 70-80% and approaching a radiographic string sign. 2. 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 3. Less pronounced posterior circulation, bilateral vertebral artery atherosclerosis but still evidence of up to moderate bilateral vertebral artery stenoses. 4.  Aortic Atherosclerosis (ICD10-I70.0). Salient findings discussed by telephone with Dr. Doretta Gant on 04/23/2024 at 12:25 . Electronically Signed   By: Marlise Simpers M.D.   On: 04/23/2024 12:27   CT HEAD CODE STROKE WO CONTRAST Result Date: 04/23/2024 CLINICAL DATA:  Code stroke. 63 year old female. Dialysis patient. Pain. EXAM: CT HEAD WITHOUT CONTRAST TECHNIQUE: Contiguous axial images were obtained from the base of the skull through the vertex without intravenous contrast. RADIATION DOSE REDUCTION: This exam was performed according to the departmental dose-optimization program which includes automated exposure control, adjustment of the mA and/or kV according to patient size and/or use of iterative reconstruction technique. COMPARISON:  Head CT,Brain MRI 12/26/2023. FINDINGS: Brain: Pronounced chronic dural calcification, stable. Stable  cerebral volume. No midline shift, ventriculomegaly, mass effect, evidence of mass lesion, intracranial hemorrhage or evidence of cortically based acute infarction. Stable gray-white matter differentiation throughout the brain, relatively normal for age. Vascular: Chronic severe calcification. Skull: Stable visualized osseous structures. Sinuses/Orbits: Severe chronic paranasal sinusitis with mucoperiosteal thickening, chronically eroded posterior septum and sphenoid septum. Stable appearance since December. Tympanic cavities and mastoids remain well aerated. Other: No acute orbit or scalp soft tissue finding. ASPECTS [facility] Stroke Program Early CT Score) Total score (0-10 with 10 being normal): 10 IMPRESSION: 1. Stable noncontrast CT appearance of the brain since December, remarkable for unusually advanced dural calcification. ASPECTS 10. No acute intracranial hemorrhage. 2. Severe chronic paranasal sinus disease. 3. These results were communicated to Dr. Doretta Gant at 12:11 pm on 04/23/2024 by text page via the [facility] messaging system. Electronically Signed   By: Marlise Simpers M.D.   On: 04/23/2024 12:12    Assessment/Plan **acute CVA s/p thrombectomy and L ICA stenting:  per stroke team.  **ESRD on HD: usual schedule MWF, missed Mon due to CVA - will arrange for HD today then resume usual schedule prior to d/c.   **HTN: BP per stroke team.  Per notes goal BP 120-160 - will set parameters for HD.    **HFrEF:  volume management with HD.  UF 1-1.5L today.    **DM type 2: per primary  **Anemia: Hb 11s, monitor.   Will follow reach out with concerns.   Baron Border 04/24/2024, 3:07 PM

## 2024-04-24 NOTE — Progress Notes (Signed)
 NIR Progress Note:  Postop Day #1 s/p emergent left ICA stent placement for acute stroke (NIHSS 14)  Clinically improved  Recommendations: Acute ischemic stroke with left ICA stent: Consider dual anti-platelet therapy with aspirin  81 mg and Brilinta 90 mg daily.  May transition to plavix at 30 days if there are issues with coverage.  Follow-up arterial duplex US  at 1 month to evaluate stent patency and monitor RICA stenosis (CTA from yesterday also notable for significant RICA stenosis). Chronic dural venous sinus thrombosis noted on postop MRI. ESRD: Not dialyzed since Friday.  Suspect a component of the patient's lethargy and breathing may be related.  Consider HD when appropriate with close monitoring of hemodynamics at that time. Routine postop stroke care and risk factor optimization. No activity restriction from femoral access standpoint. Appreciate excellent multidisciplinary care.  Please call with questions or concerns.  Overnight events: Passed swallow eval.  MRI overnight without infarct or hemorrhage.  Chronic venous sinus thrombosis noted.  Subjective: Hungry and tired, but without pain.  States that her father and sister are planning to visit today.  Objective: Physical Exam: BP (!) 160/69   Pulse 60   Temp (!) 97.4 F (36.3 C) (Oral)   Resp 17   SpO2 92%  Awake and alert Responses are slightly delayed with generalized mild weakness Answers questions appropriately Moves all extremities to command Right groin site clean without hematoma Distal extremities are well perfused RUE fistula dressing  Pretreatment:     Post treatment:       Current Facility-Administered Medications:     stroke: early stages of recovery book, , Does not apply, Once, Celia Coles C, NP   acetaminophen  (TYLENOL ) tablet 650 mg, 650 mg, Oral, Q4H PRN, 650 mg at 04/24/24 0435 **OR** acetaminophen  (TYLENOL ) 160 MG/5ML solution 650 mg, 650 mg, Per Tube, Q4H PRN **OR** acetaminophen  (TYLENOL )  suppository 650 mg, 650 mg, Rectal, Q4H PRN, Celia Coles C, NP   clevidipine (CLEVIPREX) infusion 0.5 mg/mL, 0-21 mg/hr, Intravenous, Continuous, Reagan Camera, MD, Held at 04/23/24 1615   insulin  aspart (novoLOG ) injection 0-15 Units, 0-15 Units, Subcutaneous, Q4H, Trevor Fudge, MD, 3 Units at 04/23/24 2016   Oral care mouth rinse, 15 mL, Mouth Rinse, PRN, Augustin Leber, MD, 15 mL at 04/23/24 2208   senna-docusate (Senokot-S) tablet 1 tablet, 1 tablet, Oral, QHS PRN, Audrene Lease, NP   sodium chloride  0.9 % bolus 250 mL, 250 mL, Intravenous, PRN, Reagan Camera, MD  Labs: CBC Recent Labs    04/23/24 1230 04/24/24 0616  WBC 8.7 9.6  HGB 11.9* 11.2*  HCT 37.6 34.1*  PLT 201 186   BMET Recent Labs    04/23/24 1107 04/24/24 0616  NA 135 134*  K 5.0 4.8  CL 97* 100  CO2 20* 18*  GLUCOSE 202* 73  BUN 77* 75*  CREATININE 9.95* 10.29*  CALCIUM  8.5* 8.1*   LFT Recent Labs    04/24/24 0616  PROT 7.2  ALBUMIN  3.4*  AST 11*  ALT 9  ALKPHOS 74  BILITOT 0.9   PT/INR Recent Labs    04/23/24 1230 04/24/24 0616  LABPROT 16.0* 14.8  INR 1.3* 1.1     Studies/Results: MRI 04/24/2024  IMPRESSION: No acute finding.  Negative for acute infarct.   Chronic dural venous sinus thrombosis with collaterals.    LOS: 1 day   I spent a total of 15 minutes in face to face in clinical consultation, greater than 50% of which was counseling/coordinating care.  Mickie Kozikowski,  Onnie Alatorre  04/24/2024 8:10 AM

## 2024-04-24 NOTE — Progress Notes (Signed)
 NAME:  Terry Arroyo, MRN:  [id number], DOB:  [birth year], LOS: 1 ADMISSION DATE:  04/23/2024, CONSULTATION DATE: 04/23/2024 REFERRING MD:  Reagan Camera, MD , CHIEF COMPLAINT: Right-sided weakness  History of Present Illness:  63 year old female with end-stage renal disease on hemodialysis, prior history of pituitary adenoma complicated with visual field defect, coronary artery disease status post CABG, diabetes type 2 and chronic HFrEF was brought to the emergency department at [facility] with right arm weakness, last well-known was last night, she was not a candidate for TNK due to out of window, CT head was done showed no acute stroke, CTA showing severe left internal carotid artery stenosis and 2 to 3 mm left MCA saccular aneurysm.  Patient was transferred to [facility], underwent emergent left carotid artery angioplasty and balloon placement.  PCCM was consulted for help evaluation medical management as patient remained intubated postprocedure and slow to wake up  Pertinent  Medical History   Past Medical History:  Diagnosis Date   Anemia    Angina at rest [facility])    Angioedema 03/20/2021   Lisinopril    Anterior cerebral aneurysm    approximately 5 mm; left MCA    Anxiety    Aortic atherosclerosis (HCC)    Asthma    Blind left eye    CHF (congestive heart failure) (HCC)    COPD (chronic obstructive pulmonary disease) (HCC)    Coronary artery disease    Depression    DOE (dyspnea on exertion)    ESRD (end stage renal disease) (HCC)    Gait instability    Genital herpes    GERD (gastroesophageal reflux disease)    HFrEF (heart failure with reduced ejection fraction) (HCC)    History of 2019 novel coronavirus disease (COVID-19) 03/2019   [facility] [place]    History of DVT (deep vein thrombosis)    History of DVT (deep vein thrombosis)    Hx of CABG 2018   x 4; performed in [place]    Hyperlipidemia    Hypertension    MI (myocardial infarction)  (HCC) 2018   no stents   Pituitary adenoma (HCC)    PVD (posterior vitreous detachment), right eye    Secondary hyperparathyroidism (HCC)    Sleep apnea    non-compliant with nocturnal PAP therapy   Stroke [facility]) 2014   stated affected left side.   T2DM (type 2 diabetes mellitus) (HCC)      Significant Hospital Events: Including procedures, antibiotic start and stop dates in addition to other pertinent events   5/6 presented with L MCA CVA, extubated post angioplasty and stent placement  5/7 awake and stable   Interim History / Subjective:  No acute issues overnight    Objective   Blood pressure (!) 160/69, pulse 60, temperature (!) 97.4 F (36.3 C), temperature source Oral, resp. rate 17, SpO2 92%.    Vent Mode: CPAP;PSV FiO2 (%):  [40 %] 40 % PEEP:  [5 cmH20] 5 cmH20 Pressure Support:  [5 cmH20] 5 cmH20   Intake/Output Summary (Last 24 hours) at 04/24/2024 0829 Last data filed at 04/23/2024 2200 Gross per 24 hour  Intake 803.33 ml  Output 10 ml  Net 793.33 ml   There were no vitals filed for this visit.  Examination: General: elderly F resting in bed in NAD  HEENT: pupils equal, sclera anicteric  Neuro: alert and answering questions, moves all extremities but is weaker in both the RUE and RLE with decreased sensation.  No facial droop Chest: clear bilaterally on RA without tachypnea or accessory muscle use  Heart: Regular rate and rhythm, no murmurs or gallops Abdomen: Soft, nondistended, bowel sounds present Skin: No rash  Labs: K 4.8 Na 134 Cholesterol 137 TG's 144 HDL 33  Resolved Hospital Problem list     Assessment & Plan:  Acute left MCA stroke Severe left carotid artery stenosis status post balloon angioplasty and stent placement Left MCA saccular aneurysm Acute respiratory insufficiency postprocedure End-stage renal disease on hemodialysis Chronic combined HFrEF and HFpEF Diabetes type 2 Hypertension Hyperlipidemia  -extubated post procedure  without issue -PT/OT today, no critical care needs and PCCM will s/o -continue serial neuro exams  -management per stroke team, continue asa, brilinta statin -Maintain SBP 120-160 per neuro IR -Echocardiogram and MRI is pending -nephrology consulted  -Continue sliding scale insulin  with CBG goal 140-180 -Monitor intake and output     Best Practice (right click and "Reselect all SmartList Selections" daily)   Diet/type: NPO, pending swallow screen DVT prophylaxis prophylactic heparin   Pressure ulcer(s): N/A GI prophylaxis: N/A Lines: N/A Foley:  N/A Code Status:  full code Last date of multidisciplinary goals of care discussion [Per primary team]  Labs   CBC: Recent Labs  Lab 04/23/24 1107 04/23/24 1230 04/24/24 0616  WBC 8.9 8.7 9.6  NEUTROABS  --  6.3  --   HGB 11.9* 11.9* 11.2*  HCT 37.3 37.6 34.1*  MCV 92.6 93.1 90.7  PLT 196 201 186    Basic Metabolic Panel: Recent Labs  Lab 04/23/24 1107 04/24/24 0616  NA 135 134*  K 5.0 4.8  CL 97* 100  CO2 20* 18*  GLUCOSE 202* 73  BUN 77* 75*  CREATININE 9.95* 10.29*  CALCIUM  8.5* 8.1*   GFR: Estimated Creatinine Clearance: 5.9 mL/min (A) (by C-G formula based on SCr of 10.29 mg/dL (H)). Recent Labs  Lab 04/23/24 1107 04/23/24 1230 04/24/24 0616  WBC 8.9 8.7 9.6    Liver Function Tests: Recent Labs  Lab 04/23/24 1107 04/24/24 0616  AST 12* 11*  ALT 8 9  ALKPHOS 83 74  BILITOT 1.5* 0.9  PROT 8.2* 7.2  ALBUMIN  4.1 3.4*   No results for input(s): "LIPASE", "AMYLASE" in the last 168 hours. No results for input(s): "AMMONIA" in the last 168 hours.  ABG    Component Value Date/Time   HCO3 24.9 11/01/2020 1606   TCO2 28 05/26/2023 1027   ACIDBASEDEF 0.8 11/01/2020 1606   O2SAT 54.2 11/01/2020 1606     Coagulation Profile: Recent Labs  Lab 04/23/24 1230 04/24/24 0616  INR 1.3* 1.1    Cardiac Enzymes: No results for input(s): "CKTOTAL", "CKMB", "CKMBINDEX", "TROPONINI" in the last 168  hours.  HbA1C: Hgb A1c MFr Bld  Date/Time Value Ref Range Status  04/23/2024 07:27 PM 8.6 (H) 4.8 - 5.6 % Final    Comment:    (NOTE) Pre diabetes:          5.7%-6.4%  Diabetes:              >6.4%  Glycemic control for   <7.0% adults with diabetes   05/03/2023 07:13 AM 8.1 (H) 4.8 - 5.6 % Final    Comment:    (NOTE) Pre diabetes:          5.7%-6.4%  Diabetes:              >6.4%  Glycemic control for   <7.0% adults with diabetes     CBG: Recent Labs  Lab 04/17/24 1415 04/23/24 1934 04/23/24 2329 04/24/24 0342 04/24/24 0746  GLUCAP 208* 154* 85 72 66*    Review of Systems:   Unable to obtain as patient is intubated  Past Medical History:  She,  has a past medical history of Anemia, Angina at rest [facility]), Angioedema (03/20/2021), Anterior cerebral aneurysm, Anxiety, Aortic atherosclerosis (HCC), Asthma, Blind left eye, CHF (congestive heart failure) (HCC), COPD (chronic obstructive pulmonary disease) (HCC), Coronary artery disease, Depression, DOE (dyspnea on exertion), ESRD (end stage renal disease) (HCC), Gait instability, Genital herpes, GERD (gastroesophageal reflux disease), HFrEF (heart failure with reduced ejection fraction) (HCC), History of 2019 novel coronavirus disease (COVID-19) (03/2019), History of DVT (deep vein thrombosis), History of DVT (deep vein thrombosis), CABG (2018), Hyperlipidemia, Hypertension, MI (myocardial infarction) (HCC) (2018), Pituitary adenoma (HCC), PVD (posterior vitreous detachment), right eye, Secondary hyperparathyroidism (HCC), Sleep apnea, Stroke (HCC) (2014), and T2DM (type 2 diabetes mellitus) (HCC).   Surgical History:   Past Surgical History:  Procedure Laterality Date   A/V FISTULAGRAM Right 12/29/2021   Procedure: A/V FISTULAGRAM;  Surgeon: Jackquelyn Mass, MD;  Location: [facility] INVASIVE CV LAB;  Service: Cardiovascular;  Laterality: Right;   A/V FISTULAGRAM Right 06/15/2022   Procedure: A/V Fistulagram;  Surgeon: Jackquelyn Mass, MD;  Location: [facility] INVASIVE CV LAB;  Service: Cardiovascular;  Laterality: Right;   A/V FISTULAGRAM Right 04/17/2024   Procedure: A/V Fistulagram;  Surgeon: Jackquelyn Mass, MD;  Location: [facility] INVASIVE CV LAB;  Service: Cardiovascular;  Laterality: Right;   AV FISTULA PLACEMENT Right 01/28/2021   Procedure: ARTERIOVENOUS (AV) FISTULA CREATION;  Surgeon: Jackquelyn Mass, MD;  Location: [facility] ORS;  Service: Vascular;  Laterality: Right;   AV FISTULA PLACEMENT Right 05/13/2021   Procedure: INSERTION OF ARTERIOVENOUS (AV) GORE-TEX GRAFT ARM ( BRACHIAL AXILLARY );  Surgeon: Jackquelyn Mass, MD;  Location: [facility] ORS;  Service: Vascular;  Laterality: Right;   BRAIN SURGERY  1986   pituitary tumor   BREAST EXCISIONAL BIOPSY Left yrs ago   benign   CESAREAN SECTION     COLONOSCOPY N/A 05/26/2023   Procedure: COLONOSCOPY;  Surgeon: Quintin Buckle, DO;  Location[facility] ENDOSCOPY;  Service: Gastroenterology;  Laterality: N/A;  DIALYSIS   CORONARY ARTERY BYPASS GRAFT  2018   x 4 vessels; performed in [place]    DIALYSIS/PERMA CATHETER INSERTION N/A 11/07/2020   Procedure: DIALYSIS/PERMA CATHETER INSERTION;  Surgeon: Celso College, MD;  Location: [facility] INVASIVE CV LAB;  Service: Cardiovascular;  Laterality: N/A;   DIALYSIS/PERMA CATHETER REMOVAL N/A 06/12/2021   Procedure: DIALYSIS/PERMA CATHETER REMOVAL;  Surgeon: Celso College, MD;  Location: [facility] INVASIVE CV LAB;  Service: Cardiovascular;  Laterality: N/A;   ESOPHAGOGASTRODUODENOSCOPY (EGD) WITH PROPOFOL  N/A 12/22/2021   Procedure: ESOPHAGOGASTRODUODENOSCOPY (EGD) WITH PROPOFOL ;  Surgeon: Shane Darling, MD;  Location: [facility] ENDOSCOPY;  Service: Gastroenterology;  Laterality: N/A;  IDDM   EYE SURGERY Right 2021   cataracts    IR CT HEAD LTD  04/23/2024   IR PERCUTANEOUS ART THROMBECTOMY/INFUSION INTRACRANIAL INC DIAG ANGIO  04/23/2024   IR US  GUIDE VASC ACCESS RIGHT  04/23/2024   PERIPHERAL VASCULAR THROMBECTOMY Right 04/03/2021    Procedure: A/V Fistulagram ;  Surgeon: Jackquelyn Mass, MD;  Location: [facility] INVASIVE CV LAB;  Service: Cardiovascular;  Laterality: Right;     Social History:   reports that she quit smoking about 7 years ago. Her smoking use included cigarettes. She has never used smokeless tobacco. She reports that she does not currently use  alcohol . She reports that she does not currently use drugs.   Family History:  Her family history includes Breast cancer (age of onset: 43) in her sister; CAD in her brother and mother.   Allergies Allergies  Allergen Reactions   Hydralazine  Hives, Shortness Of Breath, Swelling and Rash    Body aches, also    Kiwi Extract Hives, Swelling, Rash and Other (See Comments)    Mouth swells    Lisinopril  Swelling and Other (See Comments)    Angioedema   Shellfish Allergy Anaphylaxis and Other (See Comments)    Shrimp/lobster    Betadine [Povidone Iodine ] Hives and Rash    LEAVES WELTS ON SKIN   Septra [Sulfamethoxazole-Trimethoprim] Other (See Comments)    Reaction not stated   Metformin Diarrhea and Other (See Comments)    GI Intolerance   Tape Itching    Use paper tape whenever possible     Home Medications  Prior to Admission medications   Medication Sig Start Date End Date Taking? Authorizing Provider  acetaminophen  (TYLENOL ) 500 MG tablet Take 1,000 mg by mouth every 6 (six) hours as needed for mild pain (pain score 1-3) or moderate pain (pain score 4-6). Patient not taking: Reported on 04/17/2024    [provider]  albuterol  (VENTOLIN  HFA) 108 (90 Base) MCG/ACT inhaler Inhale 2 puffs into the lungs every 6 (six) hours as needed for wheezing or shortness of breath.    [provider]  amLODipine  (NORVASC ) 10 MG tablet Take 1 tablet (10 mg total) by mouth daily. 03/03/20   Luna Salinas, MD  aspirin  81 MG EC tablet Take 1 tablet (81 mg total) by mouth daily. 02/10/21   Brenna Cam, MD  atorvastatin  (LIPITOR ) 80 MG tablet Take 1 tablet (80  mg total) by mouth daily at 6 PM. 11/26/19   Patel, Pranav M, MD  B Complex-C-Folic Acid  (RENA-VITE RX) 1 MG TABS Take 1 tablet by mouth daily. 03/17/23   [provider]  Blood Glucose Monitoring Suppl (TRUE METRIX METER) w/Device KIT  09/02/21   [provider]  calcium  acetate (PHOSLO ) 667 MG capsule Take 1,334 mg by mouth 3 (three) times daily with meals. 11/18/22   [provider]  carvedilol  (COREG ) 25 MG tablet Take 25 mg by mouth 2 (two) times daily.    [provider]  famotidine  (PEPCID ) 40 MG tablet Take 1 tablet by mouth as needed for heartburn. 01/28/22   [provider]  furosemide  (LASIX ) 80 MG tablet Take 80 mg by mouth 4 (four) times a week. Tues, Thurs, Sat, and Sun (non-dialysis days)    [provider]  gabapentin  (NEURONTIN ) 300 MG capsule Take 300 mg by mouth daily. Patient not taking: Reported on 04/17/2024    [provider]  lidocaine -prilocaine  (EMLA ) cream Apply 1 Application topically See admin instructions. Used on Mon Wed and Fri    [provider]  methocarbamol  (ROBAXIN ) 500 MG tablet Take 1 tablet (500 mg total) by mouth 2 (two) times daily as needed for muscle spasms. 12/08/23   Amin, Ankit C, MD  mirtazapine  (REMERON ) 30 MG tablet Take 30 mg by mouth at bedtime.    [provider]  NOVOLOG  MIX 70/30 FLEXPEN (70-30) 100 UNIT/ML FlexPen Inject 15 Units into the skin 3 (three) times daily with meals.    [provider]  topiramate  (TOPAMAX ) 50 MG tablet Take 50 mg by mouth daily. 11/16/23   [provider]  traZODone  (DESYREL ) 50 MG tablet Take 0.5  tablets (25 mg total) by mouth at bedtime. Patient taking differently: Take 100 mg by mouth at bedtime. 04/16/22   Ree Candy, MD  TRUE METRIX BLOOD GLUCOSE TEST test strip  09/04/21   [provider]  vitamin B-12 (CYANOCOBALAMIN ) 1000 MCG tablet Take 1,000 mcg by mouth daily.    [provider]      Critical care time:      Patt Boozer Mishka Stegemann, PA-C [facility] See Amion for pager If no response to pager , please call [phone] until 7pm After 7:00 pm call Elink  [phone]

## 2024-04-24 NOTE — TOC Initial Note (Signed)
 Transition of Care [facility]) - Initial/Assessment Note    Patient Details  Name: Terry Arroyo MRN: [id number] Date of Birth: 1961-02-12  Transition of Care [facility]) CM/SW Contact:    Jannice Mends, LCSW Phone Number: 04/24/2024, 4:00 PM  Clinical Narrative:                 Patient requesting SNF rehab. CSW sent out referrals for review to see who is in network with her insurance and can transport to OP Dialysis in [place]. Requested [facility] review.   Expected Discharge Plan: Skilled Nursing Facility Barriers to Discharge: Continued Medical Work up, English as a second language teacher, SNF Pending bed offer   Patient Goals and CMS Choice Patient states their goals for this hospitalization and ongoing recovery are:: Rehab CMS Medicare.gov Compare Post Acute Care list provided to:: Patient Choice offered to / list presented to : Patient [place] ownership interest in [facility].provided to:: Patient    Expected Discharge Plan and Services In-house Referral: Clinical Social Work   Post Acute Care Choice: Skilled Nursing Facility Living arrangements for the past 2 months: Single Family Home                                      Prior Living Arrangements/Services Living arrangements for the past 2 months: Single Family Home Lives with:: Self Patient language and need for interpreter reviewed:: Yes Do you feel safe going back to the place where you live?: Yes      Need for Family Participation in Patient Care: Yes (Comment) Care giver support system in place?: No (comment) Current home services: DME Criminal Activity/Legal Involvement Pertinent to Current Situation/Hospitalization: No - Comment as needed  Activities of Daily Living      Permission Sought/Granted Permission sought to share information with : Facility Industrial/product designer granted to share information with : Yes, Verbal Permission Granted     Permission granted to share info w AGENCY:  SNFs        Emotional Assessment Appearance:: Appears stated age Attitude/Demeanor/Rapport: Engaged Affect (typically observed): Accepting Orientation: : Oriented to Self, Oriented to Place, Oriented to  Time, Oriented to Situation Alcohol  / Substance Use: Not Applicable Psych Involvement: No (comment)  Admission diagnosis:  Stroke [facility]) [I63.9] Patient Active Problem List   Diagnosis Date Noted   Carotid stenosis, bilateral 04/24/2024   Stroke (HCC) 04/23/2024   Acute ischemic left MCA stroke (HCC) 04/23/2024   Ambulatory dysfunction 12/08/2023   Gait instability 12/07/2023   Sinus bradycardia 12/07/2023   Shortness of breath 05/02/2023   Weakness    Elevated BUN    Nausea and vomiting 04/14/2022   Generalized weakness 04/14/2022   Dyslipidemia 04/14/2022   GERD without esophagitis 04/14/2022   Type 2 diabetes mellitus without complications (HCC) 04/14/2022   Uncontrolled type 2 diabetes mellitus with hyperglycemia, with long-term current use of insulin  (HCC) 04/14/2022   Physical debility 02/13/2022   Acute pain of left thigh 02/09/2022   Cellulitis of left thigh 02/09/2022   Atherosclerosis of native arteries of extremity with intermittent claudication (HCC) 02/04/2022   Leg pain, anterior, left 02/04/2022   Angioedema 10/28/2021   Lactic acidosis    Diabetic polyneuropathy associated with type 2 diabetes mellitus (HCC)    Sepsis (HCC) 09/05/2021   Acute CHF (congestive heart failure) (HCC) 09/05/2021   Fluid overload 06/11/2021   Hypokalemia 06/11/2021   Acute pulmonary edema (HCC)  COPD (chronic obstructive pulmonary disease) with emphysema (HCC) 02/09/2021   Chronic sphenoidal sinusitis 01/01/2021   Contact dermatitis and other eczema due to other specified agent 01/01/2021   Hydronephrosis, left 01/01/2021   Other diseases of nasal cavity and sinuses(478.19) 01/01/2021   End-stage renal disease on hemodialysis (HCC)    Reactive thrombocytosis 11/08/2020    Nephrotic syndrome 11/04/2020   SOB (shortness of breath) 11/01/2020   Acute on chronic combined systolic and diastolic CHF (congestive heart failure) (HCC) 10/21/2020   CAD (coronary artery disease) 10/21/2020   Hyperlipidemia associated with type 2 diabetes mellitus (HCC) 10/21/2020   HFrEF (heart failure with reduced ejection fraction) (HCC)    Acute respiratory distress    COPD exacerbation (HCC) 10/10/2020   Kidney hematoma 09/13/2020   Essential hypertension    Acute renal failure superimposed on stage 4 chronic kidney disease (HCC)    Hematuria 09/12/2020   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) 09/03/2020   Blind left eye 09/03/2020   Brain aneurysm 09/03/2020   Esotropia 09/03/2020   Lesion of ulnar nerve 09/03/2020   Mixed hyperlipidemia 09/03/2020   Sensory hearing loss, bilateral 09/03/2020   Tear film insufficiency 09/03/2020   Health maintenance examination 06/27/2020   Secondary hyperparathyroidism of renal origin (HCC) 04/24/2020   Anemia in chronic kidney disease 03/10/2020   Benign hypertensive kidney disease with chronic kidney disease 03/10/2020   Hyposmolality and/or hyponatremia 03/10/2020   Stage 3b chronic kidney disease (HCC) 03/10/2020   Calf tenderness    Leg edema    Type 2 diabetes mellitus with hyperlipidemia (HCC) 02/26/2020   CKD (chronic kidney disease) stage 4, GFR 15-29 ml/min (HCC) 02/26/2020   Hyponatremia 02/26/2020   Anasarca 02/26/2020   Proteinuria 02/26/2020   Hypoalbuminemia 02/26/2020   Elevated troponin 02/26/2020   Acute on chronic systolic CHF (congestive heart failure) (HCC) 02/26/2020   Aortic atherosclerosis (HCC) 02/18/2020   Hyperglycemia    Nausea    Hypertensive emergency    Myalgia due to statin 12/04/2019   Hypertensive urgency 11/24/2019   Chest pain 11/23/2019   OSA (obstructive sleep apnea) 04/10/2019   Mild episode of recurrent major depressive disorder (HCC) 04/10/2019   ACS (acute  coronary syndrome) (HCC) 03/24/2019   COVID-19 virus infection 03/24/2019   Hypertensive heart/kidney disease w/chronic kidney disease stage III (HCC) 10/21/2017   Mild nonproliferative diabetic retinopathy of right eye associated with type 2 diabetes mellitus (HCC) 09/09/2017   Genital herpes 05/07/2017   DVT (deep venous thrombosis) (HCC) 12/30/2016   History of DVT (deep vein thrombosis) 12/30/2016   Hx of CABG 2018   PVD (posterior vitreous detachment), right eye 01/09/2016   Diabetic peripheral angiopathy (HCC) 01/03/2016   Age-related nuclear cataract of both eyes 10/24/2015   Chronic nonintractable headache 10/02/2015   Dry eyes, bilateral 02/13/2015   Proptosis 12/18/2014   Iritis of right eye 12/03/2014   Anemia 08/30/2014   Depression 08/30/2014   PCP:  Jimmy Moulding, MD Pharmacy:   [facility] [place] [place] - [address] [place] [address] Phone: [phone] Fax: [phone]  [facility] [address] [place] [address] Phone: [phone] Fax: [phone]     Social Drivers of Health (SDOH) Social History: SDOH Screenings   Food Insecurity: No Food Insecurity (10/13/2023)   Received from [facility]  Housing: Unknown (01/17/2024)   Received from [facility]  Transportation Needs: No Transportation Needs (10/13/2023)  [facility] from [facility]  Utilities: Not At Risk (10/13/2023)   Received from [facility]  Depression [phone]): Low Risk  (07/08/2022)  Financial Resource Strain: Low Risk  (10/13/2023)   Received from [facility]  Tobacco Use: Medium Risk (04/23/2024)   SDOH Interventions:     Readmission Risk Interventions    10/29/2021   10:30 AM  Readmission Risk Prevention Plan  Transportation Screening Complete  Medication Review (RN Care Manager) Complete  PCP or Specialist appointment within 3-5  days of discharge Complete  SW Recovery Care/Counseling Consult Complete  Palliative Care Screening Not Applicable  Skilled Nursing Facility Not Applicable

## 2024-04-24 NOTE — Progress Notes (Addendum)
 STROKE TEAM PROGRESS NOTE    SIGNIFICANT HOSPITAL EVENTS 5/5: Patient admitted with aphasia and right-sided weakness, found to have severe left ICA stenosis with string sign and taken for emergent left ICA stenting  INTERIM HISTORY/SUBJECTIVE Patient has remained hemodynamically stable and afebrile.  She was extubated after her procedure and is doing well.  MRI was negative for acute infarct.  Patient still has some right-sided weakness.  She will need to be dialyzed later today.  OBJECTIVE  CBC    Component Value Date/Time   WBC 9.6 04/24/2024 0616   RBC 3.76 (L) 04/24/2024 0616   HGB 11.2 (L) 04/24/2024 0616   HGB 12.6 11/18/2013 0138   HCT 34.1 (L) 04/24/2024 0616   HCT 37.9 11/18/2013 0138   PLT 186 04/24/2024 0616   PLT 444 (H) 11/18/2013 0138   MCV 90.7 04/24/2024 0616   MCV 83 11/18/2013 0138   MCH 29.8 04/24/2024 0616   MCHC 32.8 04/24/2024 0616   RDW 14.6 04/24/2024 0616   RDW 14.1 11/18/2013 0138   LYMPHSABS 1.3 04/23/2024 1230   MONOABS 0.6 04/23/2024 1230   EOSABS 0.4 04/23/2024 1230   BASOSABS 0.1 04/23/2024 1230    BMET    Component Value Date/Time   NA 134 (L) 04/24/2024 0616   NA 134 (L) 11/18/2013 0138   K 4.8 04/24/2024 0616   K 3.8 11/18/2013 0138   CL 100 04/24/2024 0616   CL 98 11/18/2013 0138   CO2 18 (L) 04/24/2024 0616   CO2 29 11/18/2013 0138   GLUCOSE 73 04/24/2024 0616   GLUCOSE 348 (H) 11/18/2013 0138   BUN 75 (H) 04/24/2024 0616   BUN 11 11/18/2013 0138   CREATININE 10.29 (H) 04/24/2024 0616   CREATININE 0.87 11/18/2013 0138   CALCIUM  8.1 (L) 04/24/2024 0616   CALCIUM  9.4 11/18/2013 0138   GFRNONAA 4 (L) 04/24/2024 0616   GFRNONAA >60 11/18/2013 0138    IMAGING past 24 hours MR BRAIN WO CONTRAST Result Date: 04/24/2024 CLINICAL DATA:  Stroke follow-up EXAM: MRI HEAD WITHOUT CONTRAST TECHNIQUE: Multiplanar, multiecho pulse sequences of the brain and surrounding structures were obtained without intravenous contrast. COMPARISON:   12/07/2023 FINDINGS: Brain: No acute infarction, hemorrhage, hydrocephalus, extra-axial collection or mass lesion. Chronic generalized dural thickening with calcification by CT. Vascular: Diffusely attenuated superior sagittal sinus with absent flow void continuing into the left sigmoid sinus, chronic partial thrombosis by prior enhanced imaging with prominent venous collaterals along the right inferior convexity. Chronic thrombus associated with calcifications. Skull and upper cervical spine: Diffusely hypointense marrow signal. No focal lesion seen. Sinuses/Orbits: Prior endoscopic sinus surgery. Right proptosis not associated with acute inflammation, stable from 2024. Nasopharyngeal retention cysts. IMPRESSION: No acute finding.  Negative for acute infarct. Chronic dural venous sinus thrombosis with collaterals. Electronically Signed   By: Ronnette Coke M.D.   On: 04/24/2024 06:03   IR PERCUTANEOUS ART THROMBECTOMY/INFUSION INTRACRANIAL INC DIAG ANGIO Result Date: 04/23/2024 PROCEDURE PERFORMED: 1. Stroke thrombectomy 2. Ultrasound vascular access 3. Cone beam CT for treatment planning COMPARISON:  CT perfusion performed Apr 23, 2024. CLINICAL DATA:  63 year old female with acute ischemic stroke with symptoms of right motor dysfunction sensory deficits and dysarthria with an NIH stroke scale measuring 14. CT imaging demonstrated a severe proximal left internal carotid artery stenosis and perfusion imaging suggested hypoperfusion to the left hemisphere. After discussing the case in a multidisciplinary fashion, and confirming that there was no immediate available decision maker, the patient was transferred for thrombectomy and emergent 2 physician  consent was documented. INDICATION: Acute ischemic ischemic stroke with left hemi severe hypoperfusion. ANESTHESIA/SEDATION: General anesthesia was utilized for the procedure. CONTRAST:  Approximately 75 cc Omnipaque  300 320 MEDICATIONS: Heparin  6000 units  intravenously Brilinta 180 mg per tube FLUOROSCOPY TIME:  Fluoroscopy Time: 15.8 minutes (737 mGy). COMPLICATIONS: None immediate. BODY OF REPORT: The patient was then put under general anesthesia by the Department of Anesthesiology at [facility]. Insert ultrasound access The right groin was prepped and draped in the usual sterile fashion. Ultrasound was used to study the right common femoral artery which was patent. Using real-time ultrasound guidance, a 19 gauge introducer needle was used to access the right common femoral artery. Access was performed at 1357. A hard copy image ultrasound the same date uncertain PACS. Using this access, a 6 French sheath was placed in the descending thoracic aorta. Approximately 5000 units of heparin  was administered intravenously. Next, selective catheterization the left common carotid artery was performed. A selective arteriogram was performed which demonstrated that the internal carotid artery was patent. But demonstrated a severe focal stenosis estimated at greater than 80% in the single frontal plane of imaging. This was use a lies as it best profiled the bifurcation between the internal carotid artery and external carotid artery. There was no evidence of intracranial filling defect. Scattered mild diffuse atherosclerotic changes are present. Next, the lesion was crossed with a catheter and guidewire. At this point, my tempted to deployed embolic protection device, however, I could not safely deployed the device in the proximal internal carotid artery secondary to tortuosity and concern for device detachment. Therefore, the proximal stenosis was treated with balloon angioplasty using a 4 mm noncompliant balloon. This was considered the first pass for the purposes of documentation and this was performed at 14:20. Next, a precise 6 mm x 40 mm self expanding stent was deployed in the proximal left internal carotid artery extending into the common carotid artery.  Deployment was completed at 14:50 and this was considered recanalization. There was a residual waist and therefore stent dilation was performed using a 5 mm noncompliant balloon. A post treatment arteriogram demonstrated no evidence of significant residual proximal stenosis. There was good apposition of the bare metal stent. There was persistent filling of the left external carotid artery. The intracranial arteries were improved in terms of caliber. There was no evidence of filling defect to suggest peripheral embolus. I elected to terminate the procedure at this point. The revascularization score was graded at TICI-3 Evaluation of the right femoral access site demonstrated at this site was suitable for closure device. A 6 French Angio-Seal device was deployed without complication. Become beam CT was then performed to evaluate for intracranial hemorrhage and treatment planning. This demonstrated no evidence significant acute intracranial hemorrhage. Fluoroscopic imaging confirmed placement of an enteric tube within the stomach. At this point, 180 mg of Brilinta was administered per tube. The patient was then transferred to the ICU in stable condition while remaining intubated. IMPRESSION: 1. Emergent left internal carotid artery stent placement due to clinical concern for is global left-sided hypoperfusion and developing stroke in the setting of severe left internal carotid artery stenosis. PLAN: 1. To ICU for routine postoperative supportive care. 2. The patient will continue on a heparin  infusion intravenously at 500 units/hour for the next 6 hours. At that point, heparin  will be discontinued. 3. The patient has received a bolus dose of aspirin  PR and a bolus dose of 180 mg of Brilinta per tube. Electronically Signed  By: Reagan Camera M.D.   On: 04/23/2024 16:31   IR US  Guide Vasc Access Right Result Date: 04/23/2024 PROCEDURE PERFORMED: 1. Stroke thrombectomy 2. Ultrasound vascular access 3. Cone beam CT for  treatment planning COMPARISON:  CT perfusion performed Apr 23, 2024. CLINICAL DATA:  63 year old female with acute ischemic stroke with symptoms of right motor dysfunction sensory deficits and dysarthria with an NIH stroke scale measuring 14. CT imaging demonstrated a severe proximal left internal carotid artery stenosis and perfusion imaging suggested hypoperfusion to the left hemisphere. After discussing the case in a multidisciplinary fashion, and confirming that there was no immediate available decision maker, the patient was transferred for thrombectomy and emergent 2 physician consent was documented. INDICATION: Acute ischemic ischemic stroke with left hemi severe hypoperfusion. ANESTHESIA/SEDATION: General anesthesia was utilized for the procedure. CONTRAST:  Approximately 75 cc Omnipaque  300 320 MEDICATIONS: Heparin  6000 units intravenously Brilinta 180 mg per tube FLUOROSCOPY TIME:  Fluoroscopy Time: 15.8 minutes (737 mGy). COMPLICATIONS: None immediate. BODY OF REPORT: The patient was then put under general anesthesia by the Department of Anesthesiology at [facility]. Insert ultrasound access The right groin was prepped and draped in the usual sterile fashion. Ultrasound was used to study the right common femoral artery which was patent. Using real-time ultrasound guidance, a 19 gauge introducer needle was used to access the right common femoral artery. Access was performed at 1357. A hard copy image ultrasound the same date uncertain PACS. Using this access, a 6 French sheath was placed in the descending thoracic aorta. Approximately 5000 units of heparin  was administered intravenously. Next, selective catheterization the left common carotid artery was performed. A selective arteriogram was performed which demonstrated that the internal carotid artery was patent. But demonstrated a severe focal stenosis estimated at greater than 80% in the single frontal plane of imaging. This was use a lies as it  best profiled the bifurcation between the internal carotid artery and external carotid artery. There was no evidence of intracranial filling defect. Scattered mild diffuse atherosclerotic changes are present. Next, the lesion was crossed with a catheter and guidewire. At this point, my tempted to deployed embolic protection device, however, I could not safely deployed the device in the proximal internal carotid artery secondary to tortuosity and concern for device detachment. Therefore, the proximal stenosis was treated with balloon angioplasty using a 4 mm noncompliant balloon. This was considered the first pass for the purposes of documentation and this was performed at 14:20. Next, a precise 6 mm x 40 mm self expanding stent was deployed in the proximal left internal carotid artery extending into the common carotid artery. Deployment was completed at 14:50 and this was considered recanalization. There was a residual waist and therefore stent dilation was performed using a 5 mm noncompliant balloon. A post treatment arteriogram demonstrated no evidence of significant residual proximal stenosis. There was good apposition of the bare metal stent. There was persistent filling of the left external carotid artery. The intracranial arteries were improved in terms of caliber. There was no evidence of filling defect to suggest peripheral embolus. I elected to terminate the procedure at this point. The revascularization score was graded at TICI-3 Evaluation of the right femoral access site demonstrated at this site was suitable for closure device. A 6 French Angio-Seal device was deployed without complication. Become beam CT was then performed to evaluate for intracranial hemorrhage and treatment planning. This demonstrated no evidence significant acute intracranial hemorrhage. Fluoroscopic imaging confirmed placement of an enteric  tube within the stomach. At this point, 180 mg of Brilinta was administered per tube. The  patient was then transferred to the ICU in stable condition while remaining intubated. IMPRESSION: 1. Emergent left internal carotid artery stent placement due to clinical concern for is global left-sided hypoperfusion and developing stroke in the setting of severe left internal carotid artery stenosis. PLAN: 1. To ICU for routine postoperative supportive care. 2. The patient will continue on a heparin  infusion intravenously at 500 units/hour for the next 6 hours. At that point, heparin  will be discontinued. 3. The patient has received a bolus dose of aspirin  PR and a bolus dose of 180 mg of Brilinta per tube. Electronically Signed   By: Reagan Camera M.D.   On: 04/23/2024 16:31   IR CT Head Ltd Result Date: 04/23/2024 PROCEDURE PERFORMED: 1. Stroke thrombectomy 2. Ultrasound vascular access 3. Cone beam CT for treatment planning COMPARISON:  CT perfusion performed Apr 23, 2024. CLINICAL DATA:  63 year old female with acute ischemic stroke with symptoms of right motor dysfunction sensory deficits and dysarthria with an NIH stroke scale measuring 14. CT imaging demonstrated a severe proximal left internal carotid artery stenosis and perfusion imaging suggested hypoperfusion to the left hemisphere. After discussing the case in a multidisciplinary fashion, and confirming that there was no immediate available decision maker, the patient was transferred for thrombectomy and emergent 2 physician consent was documented. INDICATION: Acute ischemic ischemic stroke with left hemi severe hypoperfusion. ANESTHESIA/SEDATION: General anesthesia was utilized for the procedure. CONTRAST:  Approximately 75 cc Omnipaque  300 320 MEDICATIONS: Heparin  6000 units intravenously Brilinta 180 mg per tube FLUOROSCOPY TIME:  Fluoroscopy Time: 15.8 minutes (737 mGy). COMPLICATIONS: None immediate. BODY OF REPORT: The patient was then put under general anesthesia by the Department of Anesthesiology at [facility]. Insert ultrasound  access The right groin was prepped and draped in the usual sterile fashion. Ultrasound was used to study the right common femoral artery which was patent. Using real-time ultrasound guidance, a 19 gauge introducer needle was used to access the right common femoral artery. Access was performed at 1357. A hard copy image ultrasound the same date uncertain PACS. Using this access, a 6 French sheath was placed in the descending thoracic aorta. Approximately 5000 units of heparin  was administered intravenously. Next, selective catheterization the left common carotid artery was performed. A selective arteriogram was performed which demonstrated that the internal carotid artery was patent. But demonstrated a severe focal stenosis estimated at greater than 80% in the single frontal plane of imaging. This was use a lies as it best profiled the bifurcation between the internal carotid artery and external carotid artery. There was no evidence of intracranial filling defect. Scattered mild diffuse atherosclerotic changes are present. Next, the lesion was crossed with a catheter and guidewire. At this point, my tempted to deployed embolic protection device, however, I could not safely deployed the device in the proximal internal carotid artery secondary to tortuosity and concern for device detachment. Therefore, the proximal stenosis was treated with balloon angioplasty using a 4 mm noncompliant balloon. This was considered the first pass for the purposes of documentation and this was performed at 14:20. Next, a precise 6 mm x 40 mm self expanding stent was deployed in the proximal left internal carotid artery extending into the common carotid artery. Deployment was completed at 14:50 and this was considered recanalization. There was a residual waist and therefore stent dilation was performed using a 5 mm noncompliant balloon. A post treatment  arteriogram demonstrated no evidence of significant residual proximal stenosis. There  was good apposition of the bare metal stent. There was persistent filling of the left external carotid artery. The intracranial arteries were improved in terms of caliber. There was no evidence of filling defect to suggest peripheral embolus. I elected to terminate the procedure at this point. The revascularization score was graded at TICI-3 Evaluation of the right femoral access site demonstrated at this site was suitable for closure device. A 6 French Angio-Seal device was deployed without complication. Become beam CT was then performed to evaluate for intracranial hemorrhage and treatment planning. This demonstrated no evidence significant acute intracranial hemorrhage. Fluoroscopic imaging confirmed placement of an enteric tube within the stomach. At this point, 180 mg of Brilinta was administered per tube. The patient was then transferred to the ICU in stable condition while remaining intubated. IMPRESSION: 1. Emergent left internal carotid artery stent placement due to clinical concern for is global left-sided hypoperfusion and developing stroke in the setting of severe left internal carotid artery stenosis. PLAN: 1. To ICU for routine postoperative supportive care. 2. The patient will continue on a heparin  infusion intravenously at 500 units/hour for the next 6 hours. At that point, heparin  will be discontinued. 3. The patient has received a bolus dose of aspirin  PR and a bolus dose of 180 mg of Brilinta per tube. Electronically Signed   By: Reagan Camera M.D.   On: 04/23/2024 16:31   CT CEREBRAL PERFUSION W CONTRAST Result Date: 04/23/2024 CLINICAL DATA:  63 year old female code stroke presentation, profound unilateral lower extremity weakness. EXAM: CT PERFUSION BRAIN TECHNIQUE: Multiphase CT imaging of the brain was performed following IV bolus contrast injection. Subsequent parametric perfusion maps were calculated using RAPID software. RADIATION DOSE REDUCTION: This exam was performed according to the  departmental dose-optimization program which includes automated exposure control, adjustment of the mA and/or kV according to patient size and/or use of iterative reconstruction technique. CONTRAST:  50mL OMNIPAQUE  IOHEXOL  350 MG/ML SOLN COMPARISON:  CTA 1207 hours today. FINDINGS: FINDINGS ASPECTS: 10 CBF (<30%) Volume: 0mL. No CBF ir CBV parameter abnormality detected. Perfusion (Tmax>6.0s) volume: 0mL however, 88 mL of T-max greater than 4s is detected which is mostly (although not entirely) in the left hemisphere. Mismatch Volume: Not applicablemL Infarction Location:Not applicable IMPRESSION: 1. NO infarct core or penumbra detected by standard CTP parameters. 2. Suggestion of asymmetric oligemia in the left hemisphere (T-max > 4s), which might relate to the severe ICA stenoses described on CTA today. Electronically Signed   By: Marlise Simpers M.D.   On: 04/23/2024 13:04   CT Angio Chest/Abd/Pel for Dissection W and/or W/WO Result Date: 04/23/2024 CLINICAL DATA:  Weakness and back pain. Acute aortic syndrome suspected. EXAM: CT ANGIOGRAPHY CHEST, ABDOMEN AND PELVIS TECHNIQUE: Non-contrast CT of the chest was initially obtained. Multidetector CT imaging through the chest, abdomen and pelvis was performed using the standard protocol during bolus administration of intravenous contrast. Multiplanar reconstructed images and MIPs were obtained and reviewed to evaluate the vascular anatomy. RADIATION DOSE REDUCTION: This exam was performed according to the departmental dose-optimization program which includes automated exposure control, adjustment of the mA and/or kV according to patient size and/or use of iterative reconstruction technique. CONTRAST:  OMNIPAQUE  IOHEXOL  350 MG/ML SOLN COMPARISON:  CT abdomen pelvis dated 02/09/2022. FINDINGS: Evaluation is limited due to streak artifact caused by patient's arms and overlying support wires. CTA CHEST FINDINGS Cardiovascular: Mild cardiomegaly. No pericardial effusion.  There is mild atherosclerotic calcification of  the thoracic aorta. No aneurysmal dilatation or dissection. The origins of the great vessels of the aortic arch appear patent. No pulmonary artery embolus identified. Mediastinum/Nodes: No hilar or mediastinal adenopathy. There is circumferential thickening of the mid esophagus concerning for esophagitis. Clinical correlation is recommended. An infiltrative process is not excluded. No mediastinal fluid collection. Lungs/Pleura: There are minimal bibasilar linear atelectasis. No focal consolidation, pleural effusion, or pneumothorax. The central airways are patent. Musculoskeletal: Median sternotomy wires. No acute osseous pathology. Review of the MIP images confirms the above findings. CTA ABDOMEN AND PELVIS FINDINGS VASCULAR Aorta: Advanced atherosclerotic calcification of the abdominal aorta. No aneurysmal dilatation or dissection. No periaortic fluid collection. Celiac: Atherosclerotic calcification of the origin of the celiac artery. The celiac artery and its major branches are patent. SMA: The SMA is patent. Renals: There is atherosclerotic calcification of the origins of the renal arteries with luminal narrowing. There is diminished flow in the renal arteries bilaterally. The renal arteries however remain patent. IMA: Atherosclerotic calcification of the IMA with diminished flow. Focal area of high-grade narrowing of the proximal IMA with reconstitution of flow distal to this segment. Inflow: Advanced atherosclerotic calcification of the iliac arteries. The iliac arteries are patent. Veins: The IVC is unremarkable.  No portal venous gas. Review of the MIP images confirms the above findings. NON-VASCULAR No intra-abdominal free air.  Small perihepatic ascites. Hepatobiliary: The liver is unremarkable. No biliary dilatation. The gallbladder is unremarkable. Pancreas: Unremarkable. No pancreatic ductal dilatation or surrounding inflammatory changes. Spleen: Normal in  size without focal abnormality. Adrenals/Urinary Tract: The adrenal glands are unremarkable. Moderate bilateral renal parenchyma atrophy. No hydronephrosis. There is stranding of the perinephric and renal sinus fat. Correlation with urinalysis recommended to exclude UTI. The urinary bladder is collapsed. Stomach/Bowel: Mild sigmoid diverticulosis. There is no bowel obstruction or active inflammation. The appendix is normal. Lymphatic: No adenopathy. Reproductive: The uterus is grossly unremarkable. No suspicious adnexal masses. Other: None Musculoskeletal: No acute or significant osseous findings. Review of the MIP images confirms the above findings. IMPRESSION: 1. No aortic aneurysm or dissection. 2. Circumferential thickening of the mid esophagus concerning for esophagitis. Clinical correlation is recommended. 3. Mild sigmoid diverticulosis. No bowel obstruction. Normal appendix. 4. Moderate bilateral renal parenchyma atrophy. No hydronephrosis. Correlation with urinalysis recommended to exclude UTI. Electronically Signed   By: Angus Bark M.D.   On: 04/23/2024 12:44   CT ANGIO HEAD NECK W WO CM (CODE STROKE) Result Date: 04/23/2024 CLINICAL DATA:  63 year old female code stroke presentation. Possible left MCA syndrome. Dialysis patient. EXAM: CT ANGIOGRAPHY HEAD AND NECK WITH AND WITHOUT CONTRAST TECHNIQUE: Multidetector CT imaging of the head and neck was performed using the standard protocol during bolus administration of intravenous contrast. Multiplanar CT image reconstructions and MIPs were obtained to evaluate the vascular anatomy. Carotid stenosis measurements (when applicable) are obtained utilizing NASCET criteria, using the distal internal carotid diameter as the denominator. RADIATION DOSE REDUCTION: This exam was performed according to the departmental dose-optimization program which includes automated exposure control, adjustment of the mA and/or kV according to patient size and/or use of  iterative reconstruction technique. CONTRAST:  50mL OMNIPAQUE  IOHEXOL  350 MG/ML SOLN COMPARISON:  Head CT 1158 hours today. FINDINGS: CTA NECK Skeleton: Reversal of the normal cervical lordosis. Previous sternotomy. Mostly absent dentition. Upper thoracic spina bifida occulta. No acute osseous abnormality identified. Upper chest: Previous CABG. Other neck: Partially retropharyngeal carotids. Nonvascular neck soft tissue spaces are within normal limits. Aortic arch: Calcified aortic atherosclerosis. Bovine arch configuration.  Right carotid system: Brachiocephalic artery and proximal right CCA are patent with mild plaque and no stenosis. Tortuous, partially retropharyngeal right CCA. Calcified plaque at the right ICA origin without significant stenosis. Soft and calcified plaque at the bulb or just distal to the bulb with high-grade stenosis (series 2, image 94 and series 6, image 68) approaching a radiographic string sign, numerically estimated at 70-80 % with respect to the distal vessel. The vessel remains patent to the skull base. Left carotid system: Left CCA origin plaque with less than 50% stenosis. Tortuous left CCA. Soft and calcified plaque before the bifurcation. Severe plaque at the left carotid bifurcation with radiographic string sign stenosis series 5, image 133. But the vessel remains patent. Vertebral arteries: Right subclavian origin calcified plaque without stenosis. Diminutive and atherosclerotic right vertebral artery origin and V1 segment with at least moderate stenosis. Intermittent right V2 segment plaque, but the right vertebral artery caliber improves to the skull base with no additional significant stenosis. Left subclavian origin plaque without stenosis. Calcified plaque at the left vertebral artery origin with similar mild to moderate stenosis. Codominant left vertebral artery with intermittent V2 and V3 segment plaque but no other significant stenosis to the skull base. CTA HEAD Posterior  circulation: Atherosclerotic distal vertebral arteries and vertebrobasilar junction. Dominant left V4. PICA origins remain patent. Mild distal vertebral and vertebrobasilar stenosis. Patent basilar artery without stenosis. Patent SCA and PCA origins. Small posterior communicating arteries. Bilateral PCA branches are within normal limits. Anterior circulation:  Both ICA siphons are patent. Severe calcified atherosclerosis and stenosis of the cavernous left ICA on series 6, image 102, string sign stenosis. Moderate additional left supraclinoid plaque. The vessel remains patent. Left posterior communicating artery origin is normal. Similar severe calcified right siphon plaque and stenosis. Distal right ICA remains patent with moderate additional supraclinoid plaque and stenosis. Patent carotid termini, MCA and ACA origins. Normal anterior communicating artery. Bilateral ACA branches are within normal limits. Right MCA M1 segment and bifurcation are patent without stenosis. No right MCA branch occlusion is identified. Left MCA M1 segment is patent without stenosis. There is a saccular 2-3 mm left MCA bifurcation aneurysm on series 6, image 112, directed anteriorly and laterally. Left MCA bifurcation remains patent. No left MCA branch occlusion is identified. Venous sinuses: Not evaluated due to early contrast timing. Anatomic variants: Dominant left vertebral artery. Review of the MIP images confirms the above findings IMPRESSION: 1. Negative for large vessel occlusion, But Positive for VERY SEVERE underlying atherosclerosis with: - LICA origin and bilateral ICA siphon RADIOGRAPHIC STRING SIGN STENOSES. - Right ICA distal bulb high-grade stenosis, estimated 70-80% and approaching a radiographic string sign. 2. 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 3. Less pronounced posterior circulation, bilateral vertebral artery atherosclerosis but still evidence of up to moderate bilateral vertebral  artery stenoses. 4.  Aortic Atherosclerosis (ICD10-I70.0). Salient findings discussed by telephone with Dr. Doretta Gant on 04/23/2024 at 12:25 . Electronically Signed   By: Marlise Simpers M.D.   On: 04/23/2024 12:27   CT HEAD CODE STROKE WO CONTRAST Result Date: 04/23/2024 CLINICAL DATA:  Code stroke. 63 year old female. Dialysis patient. Pain. EXAM: CT HEAD WITHOUT CONTRAST TECHNIQUE: Contiguous axial images were obtained from the base of the skull through the vertex without intravenous contrast. RADIATION DOSE REDUCTION: This exam was performed according to the departmental dose-optimization program which includes automated exposure control, adjustment of the mA and/or kV according to patient size and/or use of iterative reconstruction technique. COMPARISON:  Head CT,Brain MRI 12/07/2023. FINDINGS: Brain: Pronounced chronic dural calcification, stable. Stable cerebral volume. No midline shift, ventriculomegaly, mass effect, evidence of mass lesion, intracranial hemorrhage or evidence of cortically based acute infarction. Stable gray-white matter differentiation throughout the brain, relatively normal for age. Vascular: Chronic severe calcification. Skull: Stable visualized osseous structures. Sinuses/Orbits: Severe chronic paranasal sinusitis with mucoperiosteal thickening, chronically eroded posterior septum and sphenoid septum. Stable appearance since December. Tympanic cavities and mastoids remain well aerated. Other: No acute orbit or scalp soft tissue finding. ASPECTS [facility] Stroke Program Early CT Score) Total score (0-10 with 10 being normal): 10 IMPRESSION: 1. Stable noncontrast CT appearance of the brain since December, remarkable for unusually advanced dural calcification. ASPECTS 10. No acute intracranial hemorrhage. 2. Severe chronic paranasal sinus disease. 3. These results were communicated to Dr. Doretta Gant at 12:11 pm on 04/23/2024 by text page via the [facility] messaging system. Electronically Signed   By: Marlise Simpers M.D.    On: 04/23/2024 12:12    Vitals:   04/24/24 0709 04/24/24 0800 04/24/24 0900 04/24/24 1000  BP: (!) 160/69 (!) 160/73 (!) 159/67 (!) 159/71  Pulse:  60 61 (!) 55  Resp:  16 17 (!) 23  Temp:  (!) 97.4 F (36.3 C)    TempSrc:  Oral    SpO2:  94% 92% 90%     PHYSICAL EXAM General:  Alert, well-nourished, well-developed patient in no acute distress Psych:  Mood and affect appropriate for situation CV: Regular rate and rhythm on monitor Respiratory:  Regular, unlabored respirations on room air GI: Abdomen soft and nontender   NEURO:  Mental Status: AA&Ox3, patient is able to give some history with slow responses Speech/Language: speech is without dysarthria or aphasia.    Cranial Nerves:  II: PERRL. Visual fields full.  III, IV, VI: EOMI. Eyelids elevate symmetrically.  V: Sensation is intact to light touch and diminished on the right VII: Subtle right facial droop VIII: hearing intact to voice. IX, X: Phonation is normal.  ZO:[id number] shrug 5/5. XII: tongue is midline without fasciculations. Motor: To move all 4 extremities with good antigravity strength but some weakness and drift to the right arm and right leg.  Diminished fine finger movements on the right.  Albeit left over right upper extremity Tone: is normal and bulk is normal Sensation- Intact to light touch bilaterally but slightly diminished on the right Coordination: FTN intact on the left, within limits of weakness on the right Gait- deferred  Most Recent NIH  1a Level of Conscious.: 0 1b LOC Questions: 0 1c LOC Commands: 0 2 Best Gaze: 0 3 Visual: 1 4 Facial Palsy: 1 5a Motor Arm - left: 0 5b Motor Arm - Right: 1 6a Motor Leg - Left: 0 6b Motor Leg - Right: 0 7 Limb Ataxia: 0 8 Sensory: 1 9 Best Language: 0 10 Dysarthria: 0 11 Extinct. and Inatten.: 0 TOTAL: 4   ASSESSMENT/PLAN  [name] is a 63 y.o. female with history of end-stage renal disease on dialysis, cerebral aneurysm,  diabetes, hypertension, hyperlipidemia, DVT, CAD status post CABG, CHF with EF 35 to 40% and pituitary adenoma status postresection admitted for acute onset right-sided weakness and aphasia.  Patient initially presented to her dialysis center and complained of back pain and right arm weakness as well as headache but was then found to be aphasic with dense hemiparesis on the right.  She was outside of the window for TNK but was found to have severe left ICA  stenosis and was taken to interventional radiology for left ICA stenting.  NIH on Admission 15  Acute ischemic stroke aborted by revascularization of left ICA Etiology: Critical left ICA stenosis Code Stroke CT head No acute abnormality. ASPECTS 10.    CTA head & neck negative for LVO but very severe atherosclerosis with left ICA origin radiographic string sign and bilateral ICA siphon stenosis, right ICA distal bulb high-grade stenosis, 2 to 3 mm left MCA bifurcation saccular aneurysm, moderate bilateral vertebral artery stenosis MRI no acute infarct, chronic dural venous sinus thrombosis with collaterals 2D Echo pending LDL 75 HgbA1c 8.6 VTE prophylaxis -subcutaneous heparin  aspirin  81 mg daily prior to admission, now on aspirin  81 mg daily and Brilinta (ticagrelor) 90 mg bid per interventional radiologist Therapy recommendations:  Pending Disposition: Pending  Bilateral carotid stenosis Severe bilateral carotid stenosis seen on CT angiogram Stenting of left ICA performed, patient on aspirin  and Brilinta Carotid ultrasound to evaluate right ICA pending Will explore options for right ICA stenosis with neurovascular radiology as an outpatient 1 month after discharge  Hypertension Home meds: Amlodipine  10 mg daily, carvedilol  25 mg twice daily Stable Blood Pressure Goal: SBP 120-160 for first 24 hours then less than 180   Hyperlipidemia Home meds: Atorvastatin  80 mg daily, resumed in hospital LDL 75, goal < 70 Add ezetimibe 10 mg  daily Continue statin at discharge  Diabetes type II Uncontrolled Home meds: NovoLog  70/30 15 units 3 times daily with meals HgbA1c 8.6, goal < 7.0 CBGs SSI Recommend close follow-up with PCP for better DM control  End-stage renal disease on dialysis Patient is typically dialyzed Monday Wednesday Friday Nephrology consulted for inpatient dialysis   Other Stroke Risk Factors Obesity, There is no height or weight on file to calculate BMI., BMI >/= 30 associated with increased stroke risk, recommend weight loss, diet and exercise as appropriate  Coronary artery disease Congestive heart failure   Other Active Problems None  Hospital day # 1  Patient seen by NP with MD, MD to edit note as needed. Cortney E Bucky Cardinal , MSN, AGACNP-BC [facility] See Amion for schedule and pager information 04/24/2024 11:43 AM   I have personally obtained history,examined this patient, reviewed notes, independently viewed imaging studies, participated in medical decision making and plan of care.ROS completed by me personally and pertinent positives fully documented  I have made any additions or clarifications directly to the above note. Agree with note above.  Patient presented with sudden onset of aphasia right hemiparesis with CT angiogram showing severe left extracranial carotid and cavernous carotid stenosis with string sign.  She underwent emergent left carotid angioplasty and stenting and is doing much better.  She still has mild right hemiparesis but MRI shows no acute infarct.  Continue aspirin  and Brilinta ongoing stroke workup and aggressive risk factor modification.  Mobilize out of bed.  Continue close neurological observation and keep systolic blood pressure 120-140 for 24 hours and 200 as per post intervention protocol..  Discussed with patient and Dr. Cardell Chang critical care medicine. This patient is critically ill and at significant risk of neurological worsening, death and care  requires constant monitoring of vital signs, hemodynamics,respiratory and cardiac monitoring, extensive review of multiple databases, frequent neurological assessment, discussion with family, other specialists and medical decision making of high complexity.I have made any additions or clarifications directly to the above note.This critical care time does not reflect procedure time, or teaching time or supervisory time of PA/NP/Med Resident etc but could involve  care discussion time.  I spent 30 minutes of neurocritical care time  in the care of  this patient.     Ardella Beaver, MD Medical Director [facility] Stroke Center Pager: [phone] 04/24/2024 2:41 PM  To contact Stroke Continuity provider, please refer to [email or web address]. After hours, contact General Neurology

## 2024-04-24 NOTE — Inpatient Diabetes Management (Signed)
 Inpatient Diabetes Program Recommendations  AACE/ADA: New Consensus Statement on Inpatient Glycemic Control (2015)  Target Ranges:  Prepandial:   less than 140 mg/dL      Peak postprandial:   less than 180 mg/dL (1-2 hours)      Critically ill patients:  140 - 180 mg/dL   Lab Results  Component Value Date   GLUCAP 84 04/24/2024   HGBA1C 8.6 (H) 04/23/2024    Review of Glycemic Control  Latest Reference Range & Units 04/23/24 19:34 04/23/24 23:29 04/24/24 03:42 04/24/24 07:46 04/24/24 09:15  Glucose-Capillary 70 - 99 mg/dL 161 (H) 85 72 66 (L) 84   Diabetes history: DM 2 Outpatient Diabetes medications:  Novolog  70/30 flexpen 15 units tid with meals  Current orders for Inpatient glycemic control:  Novolog  0-15 units tid with meals and HS  Inpatient Diabetes Program Recommendations:    May consider reducing Novolog  correction to very sensitive (0-6 units) tid with meals and HS.   Thanks,  Josefa Ni, RN, BC-ADM Inpatient Diabetes Coordinator Pager [phone]  (8a-5p)

## 2024-04-24 NOTE — Progress Notes (Signed)
 Pre-Hd Vitals

## 2024-04-24 NOTE — NC FL2 (Signed)
 [place]  MEDICAID FL2 LEVEL OF CARE FORM     IDENTIFICATION  Patient Name: Terry Arroyo Birthdate: November 26, 1961 Sex: female Admission Date (Current Location): 04/23/2024  [facility] and [place] Number:  Chiropodist and Address:  [facility] [place], [address] [place], [place] [address]      Provider Number: [id number]  Attending Physician Name and Address:  Stroke, Md, MD  Relative Name and Phone Number:       Current Level of Care: Hospital Recommended Level of Care: Skilled Nursing Facility Prior Approval Number:    Date Approved/Denied:   PASRR Number: [id number]  Discharge Plan: SNF    Current Diagnoses: Patient Active Problem List   Diagnosis Date Noted   Carotid stenosis, bilateral 04/24/2024   Stroke (HCC) 04/23/2024   Acute ischemic left MCA stroke (HCC) 04/23/2024   Ambulatory dysfunction 12/08/2023   Gait instability 12/07/2023   Sinus bradycardia 12/07/2023   Shortness of breath 05/02/2023   Weakness    Elevated BUN    Nausea and vomiting 04/14/2022   Generalized weakness 04/14/2022   Dyslipidemia 04/14/2022   GERD without esophagitis 04/14/2022   Type 2 diabetes mellitus without complications (HCC) 04/14/2022   Uncontrolled type 2 diabetes mellitus with hyperglycemia, with long-term current use of insulin  (HCC) 04/14/2022   Physical debility 02/13/2022   Acute pain of left thigh 02/09/2022   Cellulitis of left thigh 02/09/2022   Atherosclerosis of native arteries of extremity with intermittent claudication (HCC) 02/04/2022   Leg pain, anterior, left 02/04/2022   Angioedema 10/28/2021   Lactic acidosis    Diabetic polyneuropathy associated with type 2 diabetes mellitus (HCC)    Sepsis (HCC) 09/05/2021   Acute CHF (congestive heart failure) (HCC) 09/05/2021   Fluid overload 06/11/2021   Hypokalemia 06/11/2021   Acute pulmonary edema (HCC)    COPD (chronic obstructive pulmonary disease) with emphysema (HCC) 02/09/2021    Chronic sphenoidal sinusitis 01/01/2021   Contact dermatitis and other eczema due to other specified agent 01/01/2021   Hydronephrosis, left 01/01/2021   Other diseases of nasal cavity and sinuses(478.19) 01/01/2021   End-stage renal disease on hemodialysis (HCC)    Reactive thrombocytosis 11/08/2020   Nephrotic syndrome 11/04/2020   SOB (shortness of breath) 11/01/2020   Acute on chronic combined systolic and diastolic CHF (congestive heart failure) (HCC) 10/21/2020   CAD (coronary artery disease) 10/21/2020   Hyperlipidemia associated with type 2 diabetes mellitus (HCC) 10/21/2020   HFrEF (heart failure with reduced ejection fraction) (HCC)    Acute respiratory distress    COPD exacerbation (HCC) 10/10/2020   Kidney hematoma 09/13/2020   Essential hypertension    Acute renal failure superimposed on stage 4 chronic kidney disease (HCC)    Hematuria 09/12/2020   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) 09/03/2020   Blind left eye 09/03/2020   Brain aneurysm 09/03/2020   Esotropia 09/03/2020   Lesion of ulnar nerve 09/03/2020   Mixed hyperlipidemia 09/03/2020   Sensory hearing loss, bilateral 09/03/2020   Tear film insufficiency 09/03/2020   Health maintenance examination 06/27/2020   Secondary hyperparathyroidism of renal origin (HCC) 04/24/2020   Anemia in chronic kidney disease 03/10/2020   Benign hypertensive kidney disease with chronic kidney disease 03/10/2020   Hyposmolality and/or hyponatremia 03/10/2020   Stage 3b chronic kidney disease (HCC) 03/10/2020   Calf tenderness    Leg edema    Type 2 diabetes mellitus with hyperlipidemia (HCC) 02/26/2020   CKD (chronic kidney disease) stage  4, GFR 15-29 ml/min (HCC) 02/26/2020   Hyponatremia 02/26/2020   Anasarca 02/26/2020   Proteinuria 02/26/2020   Hypoalbuminemia 02/26/2020   Elevated troponin 02/26/2020   Acute on chronic systolic CHF (congestive heart failure) (HCC) 02/26/2020   Aortic  atherosclerosis (HCC) 02/18/2020   Hyperglycemia    Nausea    Hypertensive emergency    Myalgia due to statin 12/04/2019   Hypertensive urgency 11/24/2019   Chest pain 11/23/2019   OSA (obstructive sleep apnea) 04/10/2019   Mild episode of recurrent major depressive disorder (HCC) 04/10/2019   ACS (acute coronary syndrome) (HCC) 03/24/2019   COVID-19 virus infection 03/24/2019   Hypertensive heart/kidney disease w/chronic kidney disease stage III (HCC) 10/21/2017   Mild nonproliferative diabetic retinopathy of right eye associated with type 2 diabetes mellitus (HCC) 09/09/2017   Genital herpes 05/07/2017   DVT (deep venous thrombosis) (HCC) 12/30/2016   History of DVT (deep vein thrombosis) 12/30/2016   Hx of CABG 2018   PVD (posterior vitreous detachment), right eye 01/09/2016   Diabetic peripheral angiopathy (HCC) 01/03/2016   Age-related nuclear cataract of both eyes 10/24/2015   Chronic nonintractable headache 10/02/2015   Dry eyes, bilateral 02/13/2015   Proptosis 12/18/2014   Iritis of right eye 12/03/2014   Anemia 08/30/2014   Depression 08/30/2014    Orientation RESPIRATION BLADDER Height & Weight     Self, Time, Situation, Place  Normal Continent, External catheter Weight:   Height:     BEHAVIORAL SYMPTOMS/MOOD NEUROLOGICAL BOWEL NUTRITION STATUS      Continent Diet (See dc summary)  AMBULATORY STATUS COMMUNICATION OF NEEDS Skin   Limited Assist Verbally Other (Comment) (fistula in arm)                       Personal Care Assistance Level of Assistance  Bathing, Feeding, Dressing Bathing Assistance: Limited assistance Feeding assistance: Limited assistance Dressing Assistance: Limited assistance     Functional Limitations Info             SPECIAL CARE FACTORS FREQUENCY  PT (By licensed PT), OT (By licensed OT)     PT Frequency: 5x/week OT Frequency: 5x/week            Contractures Contractures Info: Not present    Additional Factors Info   Code Status, Allergies, Insulin  Sliding Scale Code Status Info: Full Allergies Info: Hydralazine , Kiwi Extract, Lisinopril , Shellfish Allergy, Betadine (Povidone Iodine ), Septra (Sulfamethoxazole-trimethoprim), Metformin, Tape   Insulin  Sliding Scale Info: See dc summary       Current Medications (04/24/2024):  This is the current hospital active medication list Current Facility-Administered Medications  Medication Dose Route Frequency Provider Last Rate Last Admin   acetaminophen  (TYLENOL ) tablet 650 mg  650 mg Oral Q4H PRN Lehner, Erin C, NP   650 mg at 04/24/24 0435   Or   acetaminophen  (TYLENOL ) 160 MG/5ML solution 650 mg  650 mg Per Tube Q4H PRN Lehner, Erin C, NP       Or   acetaminophen  (TYLENOL ) suppository 650 mg  650 mg Rectal Q4H PRN Lehner, Erin C, NP       amLODipine  (NORVASC ) tablet 10 mg  10 mg Oral Daily Sethi, Pramod S, MD   10 mg at 04/24/24 1118   aspirin  chewable tablet 81 mg  81 mg Oral Daily de Thayne Fine, Cortney E, NP   81 mg at 04/24/24 1117   atorvastatin  (LIPITOR ) tablet 80 mg  80 mg Oral Daily Sethi, Pramod S, MD  80 mg at 04/24/24 1119   [START ON 04/25/2024] Chlorhexidine  Gluconate Cloth 2 % PADS 6 each  6 each Topical Q0600 Baron Border, MD       ezetimibe (ZETIA) tablet 10 mg  10 mg Oral Daily de Thayne Fine, Cortney E, NP   10 mg at 04/24/24 1512   gabapentin  (NEURONTIN ) capsule 300 mg  300 mg Oral QHS Sethi, Pramod S, MD       heparin  injection 5,000 Units  5,000 Units Subcutaneous Q8H Sethi, Pramod S, MD   5,000 Units at 04/24/24 1512   insulin  aspart (novoLOG ) injection 0-5 Units  0-5 Units Subcutaneous QHS de Thayne Fine, Cortney E, NP       insulin  aspart (novoLOG ) injection 0-6 Units  0-6 Units Subcutaneous TID WC de Thayne Fine, Cortney E, NP       labetalol  (NORMODYNE ) injection 10-20 mg  10-20 mg Intravenous Q2H PRN Sethi, Pramod S, MD   10 mg at 04/24/24 1610   mirtazapine  (REMERON ) tablet 30 mg  30 mg Oral QHS Sethi, Pramod S, MD       Oral care mouth  rinse  15 mL Mouth Rinse PRN Augustin Leber, MD   15 mL at 04/23/24 2208   senna-docusate (Senokot-S) tablet 1 tablet  1 tablet Oral QHS PRN Lehner, Erin C, NP       sodium chloride  0.9 % bolus 250 mL  250 mL Intravenous PRN Reagan Camera, MD       ticagrelor [facility]) tablet 90 mg  90 mg Oral BID de Thayne Fine, Cortney E, NP   90 mg at 04/24/24 1118   topiramate  (TOPAMAX ) tablet 50 mg  50 mg Oral Daily Sethi, Pramod S, MD   50 mg at 04/24/24 1118   traZODone  (DESYREL ) tablet 50 mg  50 mg Oral QHS Sethi, Pramod S, MD         Discharge Medications: Please see discharge summary for a list of discharge medications.  Relevant Imaging Results:  Relevant Lab Results:   Additional Information [id number]. Requires OP Dialysis MWF at [facility]  Ada Holness S Lashelle Koy, LCSW

## 2024-04-24 NOTE — Evaluation (Addendum)
 Occupational Therapy Evaluation Patient Details Name: Terry Arroyo MRN: [id number] DOB: September 02, 1961 Today's Date: 04/24/2024   History of Present Illness   Pt is a 63 y/o female presenting on 5/5 with R sided weakness. CT head negative, CTA with severe L internal carotid artery stenosis and 2 to 3 mm L MCA saccular aneurysm. 5/5 S/P emergent L carotid artery angioplasty and balloon placement. PMH includes: ESRD on HD, pituitary adenoma with visual field defect, CAD s/p CABG, DM2, chronic HFrEF.     Clinical Impressions PTA patient independent with ADLs, using RW for mobility.  Pt does not drive but uses transportation for HD, gets groceries and meds delivered.  She reports living alone and having intermittent assist from neighbors for IADLs (vacuuming).  Admitted for above and presents with problem list below.  She requires min to total assist for ADLs, min assist +2 safety for transfers to recliner with R sided weakness/impaired sensation and impaired balance.  Cognitively, pt oriented and follows commands with increased time but has slow processing, decreased sequencing.  Further assessment will be beneficial. Based on performance today, believe patient will best benefit from continued OT services acutely and after dc at inpatient setting with <3hrs/day to optimize independence, safety with ADLs and mobility.   BP pre: 159/67 BP EOB: 172/79- RN provided meds  BP post 138/61 in recliner  BP after rest and legs elevated 159/71     If plan is discharge home, recommend the following:   A little help with walking and/or transfers;A lot of help with bathing/dressing/bathroom;Assistance with cooking/housework;Direct supervision/assist for medications management;Direct supervision/assist for financial management;Assist for transportation;Help with stairs or ramp for entrance;Supervision due to cognitive status     Functional Status Assessment   Patient has had a recent decline in their  functional status and demonstrates the ability to make significant improvements in function in a reasonable and predictable amount of time.     Equipment Recommendations   Other (comment) (defer)     Recommendations for Other Services   Rehab consult     Precautions/Restrictions   Precautions Precautions: Fall Recall of Precautions/Restrictions: Impaired Precaution/Restrictions Comments: watch BP (SBP 120-160) Restrictions Weight Bearing Restrictions Per Provider Order: No     Mobility Bed Mobility Overal bed mobility: Needs Assistance Bed Mobility: Supine to Sit     Supine to sit: Min assist, Used rails, HOB elevated     General bed mobility comments: cueing for technique, transferring towards L side with increased time. Min assist for trunk and scooting forward.    Transfers Overall transfer level: Needs assistance Equipment used: Rolling walker (2 wheels) Transfers: Sit to/from Stand, Bed to chair/wheelchair/BSC Sit to Stand: Min assist, +2 safety/equipment Stand pivot transfers: Min assist, +2 safety/equipment         General transfer comment: pt requires cueing for techniques and RW mgmt, increased time for mgmt of R LE      Balance Overall balance assessment: Needs assistance Sitting-balance support: No upper extremity supported, Feet supported Sitting balance-Leahy Scale: Fair     Standing balance support: Bilateral upper extremity supported, During functional activity Standing balance-Leahy Scale: Poor Standing balance comment: relies on RW                           ADL either performed or assessed with clinical judgement   ADL Overall ADL's : Needs assistance/impaired     Grooming: Minimal assistance;Sitting  Upper Body Dressing : Minimal assistance;Sitting   Lower Body Dressing: Maximal assistance;Sit to/from stand   Toilet Transfer: Minimal assistance;+2 for safety/equipment;Stand-pivot;Rolling walker (2  wheels)           Functional mobility during ADLs: Minimal assistance;+2 for safety/equipment;Rolling walker (2 wheels)       Vision Baseline Vision/History: 1 Wears glasses Patient Visual Report: No change from baseline Additional Comments: glasses not present, pt able to read clock without difficulty.     Perception         Praxis         Pertinent Vitals/Pain Pain Assessment Pain Assessment: Faces Faces Pain Scale: Hurts little more Pain Location: back Pain Descriptors / Indicators: Discomfort Pain Intervention(s): Limited activity within patient's tolerance, Monitored during session, Repositioned     Extremity/Trunk Assessment Upper Extremity Assessment Upper Extremity Assessment: Defer to OT evaluation RUE Deficits / Details: tremulous with movement, grossly 3-/5 at shoulder, 3/5 elbow and hand.  propioception appears intact but sensation impaired RUE Sensation: decreased light touch RUE Coordination: decreased fine motor;decreased gross motor   Lower Extremity Assessment Lower Extremity Assessment: RLE deficits/detail;LLE deficits/detail RLE Deficits / Details: Grossly 2/5 RLE Sensation: decreased light touch LLE Deficits / Details: Grossly 5/5 LLE Sensation: WNL   Cervical / Trunk Assessment Cervical / Trunk Assessment: Normal   Communication Communication Communication: Impaired Factors Affecting Communication: Difficulty expressing self;Reduced clarity of speech   Cognition Arousal: Alert Behavior During Therapy: Flat affect, Lability Cognition: Cognition impaired   Orientation impairments: Time Awareness: Intellectual awareness intact, Online awareness impaired Memory impairment (select all impairments): Short-term memory, Working memory Attention impairment (select first level of impairment): Selective attention Executive functioning impairment (select all impairments): Organization, Sequencing, Problem solving, Initiation, Reasoning OT -  Cognition Comments: Pt disoriented to date, but able to recall tuesday after a few minutes.  She requires cueing to sequence and probelm solve, slow processing noted.  Fair awareness, knowing she needs rehab before going home.                 Following commands: Impaired Following commands impaired: Follows one step commands with increased time, Follows multi-step commands inconsistently     Cueing  General Comments   Cueing Techniques: Verbal cues      Exercises     Shoulder Instructions      Home Living Family/patient expects to be discharged to:: Private residence Living Arrangements: Alone Available Help at Discharge: Family;Friend(s);Available PRN/intermittently Type of Home: Apartment Home Access: Elevator     Home Layout: One level     Bathroom Shower/Tub: Chief Strategy Officer: Standard     Home Equipment: Rollator (4 wheels);Cane - single point;BSC/3in1;Tub bench          Prior Functioning/Environment Prior Level of Function : Independent/Modified Independent             Mobility Comments: takes bus to HD, has groceries delivered ADLs Comments: reports neighors assist with heavy iadls (vacumming) but otherwise independent.  meds are delievered and come in pill packs.    OT Problem List: Decreased strength;Decreased activity tolerance;Impaired balance (sitting and/or standing);Pain;Decreased knowledge of precautions;Decreased knowledge of use of DME or AE;Decreased safety awareness;Decreased cognition;Decreased coordination;Impaired UE functional use   OT Treatment/Interventions: Self-care/ADL training;Neuromuscular education;DME and/or AE instruction;Therapeutic activities;Cognitive remediation/compensation;Patient/family education;Balance training      OT Goals(Current goals can be found in the care plan section)   Acute Rehab OT Goals Patient Stated Goal: get better OT Goal Formulation: With patient Time For Goal  Achievement:  05/08/24 Potential to Achieve Goals: Good   OT Frequency:  Min 2X/week    Co-evaluation PT/OT/SLP Co-Evaluation/Treatment: Yes Reason for Co-Treatment: For patient/therapist safety;To address functional/ADL transfers PT goals addressed during session: Mobility/safety with mobility OT goals addressed during session: ADL's and self-care      AM-PAC OT "6 Clicks" Daily Activity     Outcome Measure Help from another person eating meals?: A Lot Help from another person taking care of personal grooming?: A Lot Help from another person toileting, which includes using toliet, bedpan, or urinal?: Total Help from another person bathing (including washing, rinsing, drying)?: A Lot Help from another person to put on and taking off regular upper body clothing?: A Little Help from another person to put on and taking off regular lower body clothing?: Total 6 Click Score: 11   End of Session Equipment Utilized During Treatment: Rolling walker (2 wheels);Gait belt Nurse Communication: Mobility status  Activity Tolerance: Patient tolerated treatment well Patient left: with call bell/phone within reach;with chair alarm set;in chair  OT Visit Diagnosis: Other abnormalities of gait and mobility (R26.89);Muscle weakness (generalized) (M62.81);Pain;Other symptoms and signs involving cognitive function;Hemiplegia and hemiparesis Hemiplegia - Right/Left: Right Hemiplegia - dominant/non-dominant: Non-Dominant Pain - part of body:  (back)                Time: 0865-7846 OT Time Calculation (min): 30 min Charges:  OT General Charges $OT Visit: 1 Visit OT Evaluation $OT Eval Moderate Complexity: 1 Mod  Bary Boss, OT Acute Rehabilitation Services Office [phone] Secure Chat Preferred    Fredrich Jefferson 04/24/2024, 11:08 AM

## 2024-04-24 NOTE — Evaluation (Addendum)
 Physical Therapy Evaluation Patient Details Name: Terry Arroyo MRN: [id number] DOB: 07-Nov-1961 Today's Date: 04/24/2024  History of Present Illness  Pt is a 63 y/o female presenting on 5/5 with R sided weakness. CT head negative, CTA with severe L internal carotid artery stenosis and 2 to 3 mm L MCA saccular aneurysm. 5/5 S/P emergent L carotid artery angioplasty and balloon placement. PMH includes: ESRD on HD, pituitary adenoma with visual field defect, CAD s/p CABG, DM2, chronic HFrEF.  Clinical Impression  PTA, pt lives alone in an apartment with an elevator and is modI with ADL's and ambulation using a cane. Pt uses a bus for transportation to HD and has medications/groceries delivered. Pt presents with decreased functional mobility secondary to right sided sensory impairments, decreased standing balance, right sided weakness. Pt requiring min assist for bed mobility and stand pivot transfers using rolling walker. Displays right knee instability, but no overt knee buckle in stance phase. Pt becoming emotionally labile sitting edge of bed and provided emotional support. Suspect good progress given age, PLOF, motivation. Patient will benefit from intensive inpatient follow-up therapy, >3 hours/day.  Vitals: BP sitting EOB: 172/79 (107) Sitting EOB after RN provided medication: 159/82 (104) Sitting in recliner: 138/61 (82), HR 53, SpO2 92% on RA        If plan is discharge home, recommend the following: A little help with walking and/or transfers;A little help with bathing/dressing/bathroom;Assistance with cooking/housework;Assist for transportation   Can travel by private vehicle        Equipment Recommendations None recommended by PT  Recommendations for Other Services  Rehab consult    Functional Status Assessment Patient has had a recent decline in their functional status and demonstrates the ability to make significant improvements in function in a reasonable and predictable amount  of time.     Precautions / Restrictions Precautions Precautions: Fall Recall of Precautions/Restrictions: Impaired Precaution/Restrictions Comments: watch BP (SBP 120-160) Restrictions Weight Bearing Restrictions Per Provider Order: No      Mobility  Bed Mobility Overal bed mobility: Needs Assistance Bed Mobility: Supine to Sit     Supine to sit: Min assist, Used rails, HOB elevated     General bed mobility comments: cueing for technique, transferring towards L side with increased time. Min assist for trunk and scooting forward.    Transfers Overall transfer level: Needs assistance Equipment used: Rolling walker (2 wheels) Transfers: Sit to/from Stand, Bed to chair/wheelchair/BSC Sit to Stand: Min assist, +2 safety/equipment Stand pivot transfers: Min assist, +2 safety/equipment         General transfer comment: Pt with R knee instability, able to active quad with cueing. Step by step instruction for pivoting over towards left to chair, minimal R foot clearance, but able to move RLE with increased time    Ambulation/Gait                  Stairs            Wheelchair Mobility     Tilt Bed    Modified Rankin (Stroke Patients Only) Modified Rankin (Stroke Patients Only) Pre-Morbid Rankin Score: No significant disability Modified Rankin: Moderately severe disability     Balance Overall balance assessment: Needs assistance Sitting-balance support: Feet supported Sitting balance-Leahy Scale: Good     Standing balance support: Bilateral upper extremity supported Standing balance-Leahy Scale: Poor Standing balance comment: reliant on RW  Pertinent Vitals/Pain Pain Assessment Pain Assessment: Faces Faces Pain Scale: Hurts little more Pain Location: upper back Pain Descriptors / Indicators: Discomfort, Tightness Pain Intervention(s): Monitored during session    Home Living Family/patient expects to be  discharged to:: Private residence Living Arrangements: Alone Available Help at Discharge: Family;Friend(s);Available PRN/intermittently Type of Home: Apartment Home Access: Elevator       Home Layout: One level Home Equipment: Rollator (4 wheels);Cane - single point;BSC/3in1;Tub bench      Prior Function Prior Level of Function : Independent/Modified Independent             Mobility Comments: takes bus to HD, has groceries delivered ADLs Comments: reports neighors assist with heavy iadls (vacumming) but otherwise independent.  meds are delievered and come in pill packs.     Extremity/Trunk Assessment   Upper Extremity Assessment Upper Extremity Assessment: Defer to OT evaluation RUE Deficits / Details: tremulous with movement, grossly 3-/5 at shoulder, 3/5 elbow and hand.  propioception appears intact but sensation impaired RUE Sensation: decreased light touch RUE Coordination: decreased fine motor;decreased gross motor    Lower Extremity Assessment Lower Extremity Assessment: RLE deficits/detail;LLE deficits/detail RLE Deficits / Details: Grossly 2/5 RLE Sensation: decreased light touch LLE Deficits / Details: Grossly 5/5 LLE Sensation: WNL    Cervical / Trunk Assessment Cervical / Trunk Assessment: Normal  Communication   Communication Communication: Impaired Factors Affecting Communication: Difficulty expressing self;Reduced clarity of speech    Cognition Arousal: Alert Behavior During Therapy: Flat affect, Lability   PT - Cognitive impairments: No family/caregiver present to determine baseline, Orientation, Awareness, Initiation, Problem solving   Orientation impairments: Time                   PT - Cognition Comments: Not oriented to date or day of week, able to recall after being re-oriented. Slower processing with faire awareness Following commands: Impaired Following commands impaired: Follows one step commands with increased time, Follows  multi-step commands inconsistently     Cueing Cueing Techniques: Verbal cues     General Comments      Exercises     Assessment/Plan    PT Assessment Patient needs continued PT services  PT Problem List Decreased strength;Decreased activity tolerance;Decreased balance;Decreased mobility;Decreased cognition;Impaired sensation       PT Treatment Interventions DME instruction;Gait training;Functional mobility training;Therapeutic activities;Therapeutic exercise;Balance training;Patient/family education    PT Goals (Current goals can be found in the Care Plan section)  Acute Rehab PT Goals Patient Stated Goal: did not state PT Goal Formulation: With patient Time For Goal Achievement: 05/08/24 Potential to Achieve Goals: Good    Frequency Min 3X/week     Co-evaluation PT/OT/SLP Co-Evaluation/Treatment: Yes Reason for Co-Treatment: For patient/therapist safety;To address functional/ADL transfers PT goals addressed during session: Mobility/safety with mobility OT goals addressed during session: ADL's and self-care       AM-PAC PT "6 Clicks" Mobility  Outcome Measure Help needed turning from your back to your side while in a flat bed without using bedrails?: A Little Help needed moving from lying on your back to sitting on the side of a flat bed without using bedrails?: A Little Help needed moving to and from a bed to a chair (including a wheelchair)?: A Little Help needed standing up from a chair using your arms (e.g., wheelchair or bedside chair)?: A Little Help needed to walk in hospital room?: A Lot Help needed climbing 3-5 steps with a railing? : Total 6 Click Score: 15    End of  Session Equipment Utilized During Treatment: Gait belt Activity Tolerance: Patient tolerated treatment well Patient left: in chair;with call bell/phone within reach;with chair alarm set Nurse Communication: Mobility status PT Visit Diagnosis: Unsteadiness on feet (R26.81);Difficulty in  walking, not elsewhere classified (R26.2);Hemiplegia and hemiparesis Hemiplegia - Right/Left: Right Hemiplegia - dominant/non-dominant: Non-dominant Hemiplegia - caused by:  (L MCA aneurysm)    Time: 4098-1191 PT Time Calculation (min) (ACUTE ONLY): 24 min   Charges:   PT Evaluation $PT Eval Moderate Complexity: 1 Mod   PT General Charges $$ ACUTE PT VISIT: 1 Visit        Verdia Glad, PT, DPT Acute Rehabilitation Services Office [phone]   Terry Arroyo 04/24/2024, 10:38 AM

## 2024-04-24 NOTE — Telephone Encounter (Signed)
 Patient Product/process development scientist completed.    The patient is insured through [employer]. Patient has Medicare and is not eligible for a copay card, but may be able to apply for patient assistance or Medicare RX Payment Plan (Patient Must reach out to their plan, if eligible for payment plan), if available.    Ran test claim for Brilinta 90 mg and the current 30 day co-pay is $0.00.   This test claim was processed through [facility]- copay amounts may vary at other pharmacies due to pharmacy/plan contracts, or as the patient moves through the different stages of their insurance plan.     Morgan Arab, CPHT Pharmacy Technician III Certified Patient Advocate [facility] Patient Advocate Team Direct Number: [phone]  Fax: [phone]

## 2024-04-24 NOTE — TOC CAGE-AID Note (Signed)
 Transition of Care [facility]) - CAGE-AID Screening   Patient Details  Name: Terry Arroyo MRN: [id number] Date of Birth: 1961/09/08  [facility] CM/SW Contact:    Benz Vandenberghe E Roque Schill, LCSW Phone Number: 04/24/2024, 9:43 AM   Clinical Narrative: Denied substance use.   CAGE-AID Screening:    Have You Ever Felt You Ought to Cut Down on Your Drinking or Drug Use?: No Have People Annoyed You By Critizing Your Drinking Or Drug Use?: No Have You Felt Bad Or Guilty About Your Drinking Or Drug Use?: No Have You Ever Had a Drink or Used Drugs First Thing In The Morning to Steady Your Nerves or to Get Rid of a Hangover?: No CAGE-AID Score: 0  Substance Abuse Education Offered: No

## 2024-04-24 NOTE — Progress Notes (Signed)
 Inpatient Rehab Admissions Coordinator Note:   Per therapy recommendations patient was screened for CIR candidacy by Mickey Alar, PT. At this time, pt appears to be a potential candidate for CIR. I will place an order for rehab consult for full assessment, per our protocol.  Please contact me any with questions.Loye Rumble, PT, DPT [phone] 04/24/24 10:59 AM

## 2024-04-24 NOTE — Progress Notes (Signed)
Hd Start

## 2024-04-25 ENCOUNTER — Inpatient Hospital Stay (HOSPITAL_COMMUNITY)

## 2024-04-25 ENCOUNTER — Other Ambulatory Visit (HOSPITAL_COMMUNITY)

## 2024-04-25 DIAGNOSIS — I6389 Other cerebral infarction: Secondary | ICD-10-CM | POA: Diagnosis not present

## 2024-04-25 DIAGNOSIS — R569 Unspecified convulsions: Secondary | ICD-10-CM

## 2024-04-25 DIAGNOSIS — I6522 Occlusion and stenosis of left carotid artery: Secondary | ICD-10-CM | POA: Diagnosis not present

## 2024-04-25 DIAGNOSIS — I639 Cerebral infarction, unspecified: Secondary | ICD-10-CM

## 2024-04-25 LAB — BASIC METABOLIC PANEL WITH GFR
Anion gap: 14 (ref 5–15)
BUN: 43 mg/dL — ABNORMAL HIGH (ref 8–23)
CO2: 22 mmol/L (ref 22–32)
Calcium: 8.6 mg/dL — ABNORMAL LOW (ref 8.9–10.3)
Chloride: 97 mmol/L — ABNORMAL LOW (ref 98–111)
Creatinine, Ser: 7.09 mg/dL — ABNORMAL HIGH (ref 0.44–1.00)
GFR, Estimated: 6 mL/min — ABNORMAL LOW (ref >60)
Glucose, Bld: 143 mg/dL — ABNORMAL HIGH (ref 70–99)
Potassium: 3.9 mmol/L (ref 3.5–5.1)
Sodium: 133 mmol/L — ABNORMAL LOW (ref 135–145)

## 2024-04-25 LAB — CBC
HCT: 32.1 % — ABNORMAL LOW (ref 36.0–46.0)
Hemoglobin: 10.5 g/dL — ABNORMAL LOW (ref 12.0–15.0)
MCH: 29.1 pg (ref 26.0–34.0)
MCHC: 32.7 g/dL (ref 30.0–36.0)
MCV: 88.9 fL (ref 80.0–100.0)
Platelets: 172 10*3/uL (ref 150–400)
RBC: 3.61 MIL/uL — ABNORMAL LOW (ref 3.87–5.11)
RDW: 14.6 % (ref 11.5–15.5)
WBC: 8.3 10*3/uL (ref 4.0–10.5)
nRBC: 0 % (ref 0.0–0.2)

## 2024-04-25 LAB — ECHOCARDIOGRAM COMPLETE
AR max vel: 1.14 cm2
AV Area VTI: 1.21 cm2
AV Area mean vel: 1.13 cm2
AV Mean grad: 6 mmHg
AV Peak grad: 13.1 mmHg
Ao pk vel: 1.81 m/s
Area-P 1/2: 5.27 cm2
Calc EF: 41.5 %
Height: 66 in
MV VTI: 1.18 cm2
S' Lateral: 5.1 cm
Single Plane A2C EF: 46 %
Single Plane A4C EF: 35.5 %
Weight: 2592.61 [oz_av]

## 2024-04-25 LAB — MAGNESIUM: Magnesium: 1.8 mg/dL (ref 1.7–2.4)

## 2024-04-25 LAB — GLUCOSE, CAPILLARY
Glucose-Capillary: 122 mg/dL — ABNORMAL HIGH (ref 70–99)
Glucose-Capillary: 147 mg/dL — ABNORMAL HIGH (ref 70–99)
Glucose-Capillary: 159 mg/dL — ABNORMAL HIGH (ref 70–99)
Glucose-Capillary: 170 mg/dL — ABNORMAL HIGH (ref 70–99)
Glucose-Capillary: 174 mg/dL — ABNORMAL HIGH (ref 70–99)

## 2024-04-25 LAB — HEPATITIS B SURFACE ANTIBODY, QUANTITATIVE: Hep B S AB Quant (Post): 45.1 m[IU]/mL

## 2024-04-25 MED ORDER — ALBUTEROL SULFATE HFA 108 (90 BASE) MCG/ACT IN AERS: 2.0000 | INHALATION_SPRAY | Freq: Four times a day (QID) | RESPIRATORY_TRACT | Status: DC | PRN

## 2024-04-25 MED ORDER — ALBUTEROL SULFATE HFA 108 (90 BASE) MCG/ACT IN AERS
2.0000 | INHALATION_SPRAY | Freq: Four times a day (QID) | RESPIRATORY_TRACT | Status: DC | PRN
  Administered 2024-04-25 (×2): 2 via RESPIRATORY_TRACT
  Filled 2024-04-25: qty 6.7

## 2024-04-25 MED ORDER — GUAIFENESIN-DM 100-10 MG/5ML PO SYRP
5.0000 mL | ORAL_SOLUTION | Freq: Once | ORAL | Status: AC
  Administered 2024-04-25: 5 mL via ORAL
  Filled 2024-04-25: qty 5

## 2024-04-25 MED ORDER — IPRATROPIUM-ALBUTEROL 0.5-2.5 (3) MG/3ML IN SOLN
3.0000 mL | Freq: Four times a day (QID) | RESPIRATORY_TRACT | Status: DC | PRN
  Administered 2024-04-25 – 2024-04-26 (×2): 3 mL via RESPIRATORY_TRACT
  Filled 2024-04-25 (×2): qty 3

## 2024-04-25 NOTE — Progress Notes (Addendum)
 STROKE TEAM PROGRESS NOTE    SIGNIFICANT HOSPITAL EVENTS 5/5: Patient admitted with aphasia and right-sided weakness, found to have severe left ICA stenosis with string sign and taken for emergent left ICA stenting  INTERIM HISTORY/SUBJECTIVE Patient has remained hemodynamically stable and afebrile. MRI was negative for acute infarct.  Patient still has some right-sided weakness. Nebs added. EEG negative. Nephrology following. Awaiting SNF placement   OBJECTIVE  CBC    Component Value Date/Time   WBC 8.3 04/25/2024 0529   RBC 3.61 (L) 04/25/2024 0529   HGB 10.5 (L) 04/25/2024 0529   HGB 12.6 11/18/2013 0138   HCT 32.1 (L) 04/25/2024 0529   HCT 37.9 11/18/2013 0138   PLT 172 04/25/2024 0529   PLT 444 (H) 11/18/2013 0138   MCV 88.9 04/25/2024 0529   MCV 83 11/18/2013 0138   MCH 29.1 04/25/2024 0529   MCHC 32.7 04/25/2024 0529   RDW 14.6 04/25/2024 0529   RDW 14.1 11/18/2013 0138   LYMPHSABS 1.3 04/23/2024 1230   MONOABS 0.6 04/23/2024 1230   EOSABS 0.4 04/23/2024 1230   BASOSABS 0.1 04/23/2024 1230    BMET    Component Value Date/Time   NA 133 (L) 04/25/2024 0529   NA 134 (L) 11/18/2013 0138   K 3.9 04/25/2024 0529   K 3.8 11/18/2013 0138   CL 97 (L) 04/25/2024 0529   CL 98 11/18/2013 0138   CO2 22 04/25/2024 0529   CO2 29 11/18/2013 0138   GLUCOSE 143 (H) 04/25/2024 0529   GLUCOSE 348 (H) 11/18/2013 0138   BUN 43 (H) 04/25/2024 0529   BUN 11 11/18/2013 0138   CREATININE 7.09 (H) 04/25/2024 0529   CREATININE 0.87 11/18/2013 0138   CALCIUM  8.6 (L) 04/25/2024 0529   CALCIUM  9.4 11/18/2013 0138   GFRNONAA 6 (L) 04/25/2024 0529   GFRNONAA >60 11/18/2013 0138    IMAGING past 24 hours No results found.   Vitals:   04/24/24 2345 04/25/24 0100 04/25/24 0405 04/25/24 0806  BP: (!) 146/64 (!) 143/63 (!) 154/61 (!) (P) 152/67  Pulse: 62 63 65 (P) 62  Resp: 18 18 16  (P) 17  Temp: 98.7 F (37.1 C) 98.4 F (36.9 C) 98.6 F (37 C) (P) 98 F (36.7 C)  TempSrc:  Oral Oral Axillary (P) Oral  SpO2: 100% 97% 92% (P) 97%  Weight:         PHYSICAL EXAM General:  Alert, well-nourished, well-developed patient in no acute distress Psych:  Mood and affect appropriate for situation CV: Regular rate and rhythm on monitor Respiratory:  Regular, unlabored respirations on room air GI: Abdomen soft and nontender   NEURO:  Mental Status: AA&Ox3, patient is able to give some history with slow responses Speech/Language: speech is without dysarthria or aphasia.    Cranial Nerves:  II: PERRL. Visual fields full.  III, IV, VI: EOMI. Eyelids elevate symmetrically.  V: Sensation is intact to light touch and diminished on the right VII: Subtle right facial droop VIII: hearing intact to voice. IX, X: Phonation is normal.  [id number] shrug 5/5. XII: tongue is midline without fasciculations. Motor: To move all 4 extremities with good antigravity strength but some weakness and drift to the right arm and right leg.  Diminished fine finger movements on the right.  Albeit left over right upper extremity Tone: is normal and bulk is normal Sensation- Intact to light touch bilaterally but slightly diminished on the right Coordination: FTN intact on the left, within limits of weakness on the right Gait-  deferred  Most Recent NIH  1a Level of Conscious.: 0 1b LOC Questions: 0 1c LOC Commands: 0 2 Best Gaze: 0 3 Visual: 1 4 Facial Palsy: 1 5a Motor Arm - left: 0 5b Motor Arm - Right: 1 6a Motor Leg - Left: 0 6b Motor Leg - Right: 0 7 Limb Ataxia: 0 8 Sensory: 1 9 Best Language: 0 10 Dysarthria: 0 11 Extinct. and Inatten.: 0 TOTAL: 4   ASSESSMENT/PLAN  [name] is a 63 y.o. female with history of end-stage renal disease on dialysis, cerebral aneurysm, diabetes, hypertension, hyperlipidemia, DVT, CAD status post CABG, CHF with EF 35 to 40% and pituitary adenoma status postresection admitted for acute onset right-sided weakness and aphasia.   Patient initially presented to her dialysis center and complained of back pain and right arm weakness as well as headache but was then found to be aphasic with dense hemiparesis on the right.  She was outside of the window for TNK but was found to have severe left ICA stenosis and was taken to interventional radiology for left ICA stenting.  NIH on Admission 15  Acute ischemic stroke aborted by revascularization of left ICA Etiology: Critical left ICA stenosis Code Stroke CT head No acute abnormality. ASPECTS 10.    CTA head & neck negative for LVO but very severe atherosclerosis with left ICA origin radiographic string sign and bilateral ICA siphon stenosis, right ICA distal bulb high-grade stenosis, 2 to 3 mm left MCA bifurcation saccular aneurysm, moderate bilateral vertebral artery stenosis MRI no acute infarct, chronic dural venous sinus thrombosis with collaterals 2D Echo pending Vas Carotid US - pending  EEG- This study is within normal limits. No seizures or epileptiform discharges were seen throughout the recording. A normal interictal EEG does not exclude the diagnosis of epilepsy. LDL 75 HgbA1c 8.6 VTE prophylaxis -subcutaneous heparin  aspirin  81 mg daily prior to admission, now on aspirin  81 mg daily and Brilinta (ticagrelor) 90 mg bid per interventional radiologist Therapy recommendations:  Pending Disposition: Pending  Bilateral carotid stenosis Severe bilateral carotid stenosis seen on CT angiogram Stenting of left ICA performed, patient on aspirin  and Brilinta Carotid ultrasound to evaluate right ICA pending Will explore options for right ICA stenosis with neurovascular radiology as an outpatient 1 month after discharge  Hypertension Home meds: Amlodipine  10 mg daily, carvedilol  25 mg twice daily Stable Blood Pressure Goal: SBP 120-160 for first 24 hours then less than 180   Hyperlipidemia Home meds: Atorvastatin  80 mg daily, resumed in hospital LDL 75, goal < 70 Add  ezetimibe 10 mg daily Continue statin at discharge  Diabetes type II Uncontrolled Home meds: NovoLog  70/30 15 units 3 times daily with meals HgbA1c 8.6, goal < 7.0 CBGs SSI Recommend close follow-up with PCP for better DM control  End-stage renal disease on dialysis Patient is typically dialyzed Monday Wednesday Friday Nephrology consulted for inpatient dialysis   Other Stroke Risk Factors Obesity, Body mass index is 26.15 kg/m., BMI >/= 30 associated with increased stroke risk, recommend weight loss, diet and exercise as appropriate  Coronary artery disease Congestive heart failure   Other Active Problems None  Hospital day # 2  Patient seen and examined by NP/APP with MD. MD to update note as needed.   Terry Mana, DNP, FNP-BC [facility] Pager: [phone] I have personally obtained history,examined this patient, reviewed notes, independently viewed imaging studies, participated in medical decision making and plan of care.ROS completed by me personally and pertinent positives fully documented  I have made any additions or clarifications directly to the above note. Agree with note above.  Patient medically stable to be transferred to skilled nursing facility for rehab when bed available.  No family at the bedside.  Terry Beaver, MD Medical Director [facility] Pager: [phone] 04/25/2024 4:06 PM   To contact Stroke Continuity provider, please refer to [email or web address]. After hours, contact General Neurology

## 2024-04-25 NOTE — Progress Notes (Signed)
*  PRELIMINARY RESULTS* Echocardiogram 2D Echocardiogram has been performed.  Terry Arroyo 04/25/2024, 10:22 AM

## 2024-04-25 NOTE — Progress Notes (Signed)
 EEG complete - results pending

## 2024-04-25 NOTE — Progress Notes (Signed)
   04/25/24 0256  Provider Notification  Provider Name/Title Dr Alecia Ames  Date Provider Notified 04/25/24  Time Provider Notified [phone]  Method of Notification Page  Notification Reason Other (Comment) (Pt still c/o of back and abdominal when coughing)  Provider response In department (at bedside)  Date of Provider Response 04/25/24  Time of Provider Response [phone]

## 2024-04-25 NOTE — Progress Notes (Signed)
 EEG tech, not able to complete study, called away.  Will defer study to day team.

## 2024-04-25 NOTE — Progress Notes (Signed)
 PT Cancellation Note  Patient Details Name: [name] MRN: [id number] DOB: 1961-06-14   Cancelled Treatment:    Reason Eval/Treat Not Completed: Patient at procedure or test/unavailable (Pt now getting echo done per RN.)   Elye Harmsen 04/25/2024, 10:10 AM

## 2024-04-25 NOTE — Progress Notes (Signed)
 Pt transferred from 4N ICU vis Hemodialysis, alert and oriented, c/o of pain in abdomen and back when coughing, settled in bed with call light at bedside, tele monitor put and verified on pt, was however reassured and will continue to monitor, v/s stable. Obasogie-Asidi, Ennis Delpozo Efe

## 2024-04-25 NOTE — Progress Notes (Signed)
  [facility] Progress Note   Subjective:   requesting neb for dyspnea, o/w no new complaints.  TTE being done at time of exam.  She tolerated HD well overnight.   Objective Vitals:   04/25/24 0100 04/25/24 0405 04/25/24 0806 04/25/24 1046  BP: (!) 143/63 (!) 154/61 (!) (P) 152/67   Pulse: 63 65 (P) 62 64  Resp: 18 16 (P) 17 20  Temp: 98.4 F (36.9 C) 98.6 F (37 C) (P) 98 F (36.7 C)   TempSrc: Oral Axillary (P) Oral   SpO2: 97% 92% (P) 97%   Weight:       Physical Exam Gen: slightly anxious in bed Eyes: EOMI ENT:MMM Neck: supple CV:  sinus brady on monitor Abd: soft Lungs: clear on 2L [place] but slightly inc WOB Extr: RUE AVF +t/b, no LE edema Neuro:awake, conversant, dysarthria but able to communicate, 4/5 strength R upper and lower, L ok Skin: no rashes   Additional Objective Labs: Basic Metabolic Panel: Recent Labs  Lab 04/23/24 1107 04/24/24 0616 04/25/24 0529  NA 135 134* 133*  K 5.0 4.8 3.9  CL 97* 100 97*  CO2 20* 18* 22  GLUCOSE 202* 73 143*  BUN 77* 75* 43*  CREATININE 9.95* 10.29* 7.09*  CALCIUM  8.5* 8.1* 8.6*   Liver Function Tests: Recent Labs  Lab 04/23/24 1107 04/24/24 0616  AST 12* 11*  ALT 8 9  ALKPHOS 83 74  BILITOT 1.5* 0.9  PROT 8.2* 7.2  ALBUMIN  4.1 3.4*   No results for input(s): "LIPASE", "AMYLASE" in the last 168 hours. CBC: Recent Labs  Lab 04/23/24 1107 04/23/24 1230 04/24/24 0616 04/25/24 0529  WBC 8.9 8.7 9.6 8.3  NEUTROABS  --  6.3  --   --   HGB 11.9* 11.9* 11.2* 10.5*  HCT 37.3 37.6 34.1* 32.1*  MCV 92.6 93.1 90.7 88.9  PLT 196 201 186 172   Blood Culture    Component Value Date/Time   SDES BLOOD LEFT WRIST 02/10/2022 2205   SPECREQUEST  02/10/2022 2205    BOTTLES DRAWN AEROBIC AND ANAEROBIC Blood Culture adequate volume   CULT  02/10/2022 2205    NO GROWTH 5 DAYS Performed at [facility], [address] [place], [place] [address]    REPTSTATUS 02/15/2022 FINAL 02/10/2022 2205     Cardiac Enzymes: No results for input(s): "CKTOTAL", "CKMB", "CKMBINDEX", "TROPONINI" in the last 168 hours. CBG: Recent Labs  Lab 04/24/24 0915 04/24/24 1151 04/24/24 1706 04/25/24 0058 04/25/24 0614  GLUCAP 84 153* 160* 122* 147*   Iron Studies: No results for input(s): "IRON", "TIBC", "TRANSFERRIN", "FERRITIN" in the last 72 hours. @lablastinr3 @ Studies/Results: EEG adult Result Date: 04/25/2024 Arleene Lack, MD     04/25/2024 10:18 AM Patient Name: SHONIQUE DISIMONE MRN: [id number] Epilepsy Attending: Arleene Lack Referring Physician/Provider: Colon Dear, NP Date: 04/25/2024 Duration: 25.36 mins Patient history: 63 year old female with acute ischemic stroke getting EEG to evaluate for seizure. Level of alertness: Awake AEDs during EEG study: GBP, TPM Technical aspects: This EEG study was done with scalp electrodes positioned according to the 10-20 International system of electrode placement. Electrical activity was reviewed with band pass filter of 1-70Hz , sensitivity of 7 uV/mm, display speed of 58mm/sec with a 60Hz  notched filter applied as appropriate. EEG data were recorded continuously and digitally stored.  Video monitoring was available and reviewed as appropriate. Description: The posterior dominant rhythm consists of 8 Hz activity of moderate voltage (25-35 uV) seen predominantly in  posterior head regions, symmetric and reactive to eye opening and eye closing. Hyperventilation and photic stimulation were not performed.   IMPRESSION: This study is within normal limits. No seizures or epileptiform discharges were seen throughout the recording. A normal interictal EEG does not exclude the diagnosis of epilepsy. Arleene Lack   MR BRAIN WO CONTRAST Result Date: 04/24/2024 CLINICAL DATA:  Stroke follow-up EXAM: MRI HEAD WITHOUT CONTRAST TECHNIQUE: Multiplanar, multiecho pulse sequences of the brain and surrounding structures were obtained without intravenous contrast.  COMPARISON:  12/07/2023 FINDINGS: Brain: No acute infarction, hemorrhage, hydrocephalus, extra-axial collection or mass lesion. Chronic generalized dural thickening with calcification by CT. Vascular: Diffusely attenuated superior sagittal sinus with absent flow void continuing into the left sigmoid sinus, chronic partial thrombosis by prior enhanced imaging with prominent venous collaterals along the right inferior convexity. Chronic thrombus associated with calcifications. Skull and upper cervical spine: Diffusely hypointense marrow signal. No focal lesion seen. Sinuses/Orbits: Prior endoscopic sinus surgery. Right proptosis not associated with acute inflammation, stable from 2024. Nasopharyngeal retention cysts. IMPRESSION: No acute finding.  Negative for acute infarct. Chronic dural venous sinus thrombosis with collaterals. Electronically Signed   By: Ronnette Coke M.D.   On: 04/24/2024 06:03   IR PERCUTANEOUS ART THROMBECTOMY/INFUSION INTRACRANIAL INC DIAG ANGIO Result Date: 04/23/2024 PROCEDURE PERFORMED: 1. Stroke thrombectomy 2. Ultrasound vascular access 3. Cone beam CT for treatment planning COMPARISON:  CT perfusion performed Apr 23, 2024. CLINICAL DATA:  63 year old female with acute ischemic stroke with symptoms of right motor dysfunction sensory deficits and dysarthria with an NIH stroke scale measuring 14. CT imaging demonstrated a severe proximal left internal carotid artery stenosis and perfusion imaging suggested hypoperfusion to the left hemisphere. After discussing the case in a multidisciplinary fashion, and confirming that there was no immediate available decision maker, the patient was transferred for thrombectomy and emergent 2 physician consent was documented. INDICATION: Acute ischemic ischemic stroke with left hemi severe hypoperfusion. ANESTHESIA/SEDATION: General anesthesia was utilized for the procedure. CONTRAST:  Approximately 75 cc Omnipaque  300 320 MEDICATIONS: Heparin  6000 units  intravenously Brilinta 180 mg per tube FLUOROSCOPY TIME:  Fluoroscopy Time: 15.8 minutes (737 mGy). COMPLICATIONS: None immediate. BODY OF REPORT: The patient was then put under general anesthesia by the Department of Anesthesiology at [facility]. Insert ultrasound access The right groin was prepped and draped in the usual sterile fashion. Ultrasound was used to study the right common femoral artery which was patent. Using real-time ultrasound guidance, a 19 gauge introducer needle was used to access the right common femoral artery. Access was performed at 1357. A hard copy image ultrasound the same date uncertain PACS. Using this access, a 6 French sheath was placed in the descending thoracic aorta. Approximately 5000 units of heparin  was administered intravenously. Next, selective catheterization the left common carotid artery was performed. A selective arteriogram was performed which demonstrated that the internal carotid artery was patent. But demonstrated a severe focal stenosis estimated at greater than 80% in the single frontal plane of imaging. This was use a lies as it best profiled the bifurcation between the internal carotid artery and external carotid artery. There was no evidence of intracranial filling defect. Scattered mild diffuse atherosclerotic changes are present. Next, the lesion was crossed with a catheter and guidewire. At this point, my tempted to deployed embolic protection device, however, I could not safely deployed the device in the proximal internal carotid artery secondary to tortuosity and concern for device detachment. Therefore, the proximal stenosis was treated  with balloon angioplasty using a 4 mm noncompliant balloon. This was considered the first pass for the purposes of documentation and this was performed at 14:20. Next, a precise 6 mm x 40 mm self expanding stent was deployed in the proximal left internal carotid artery extending into the common carotid artery.  Deployment was completed at 14:50 and this was considered recanalization. There was a residual waist and therefore stent dilation was performed using a 5 mm noncompliant balloon. A post treatment arteriogram demonstrated no evidence of significant residual proximal stenosis. There was good apposition of the bare metal stent. There was persistent filling of the left external carotid artery. The intracranial arteries were improved in terms of caliber. There was no evidence of filling defect to suggest peripheral embolus. I elected to terminate the procedure at this point. The revascularization score was graded at TICI-3 Evaluation of the right femoral access site demonstrated at this site was suitable for closure device. A 6 French Angio-Seal device was deployed without complication. Become beam CT was then performed to evaluate for intracranial hemorrhage and treatment planning. This demonstrated no evidence significant acute intracranial hemorrhage. Fluoroscopic imaging confirmed placement of an enteric tube within the stomach. At this point, 180 mg of Brilinta was administered per tube. The patient was then transferred to the ICU in stable condition while remaining intubated. IMPRESSION: 1. Emergent left internal carotid artery stent placement due to clinical concern for is global left-sided hypoperfusion and developing stroke in the setting of severe left internal carotid artery stenosis. PLAN: 1. To ICU for routine postoperative supportive care. 2. The patient will continue on a heparin  infusion intravenously at 500 units/hour for the next 6 hours. At that point, heparin  will be discontinued. 3. The patient has received a bolus dose of aspirin  PR and a bolus dose of 180 mg of Brilinta per tube. Electronically Signed   By: Reagan Camera M.D.   On: 04/23/2024 16:31   IR US  Guide Vasc Access Right Result Date: 04/23/2024 PROCEDURE PERFORMED: 1. Stroke thrombectomy 2. Ultrasound vascular access 3. Cone beam CT for  treatment planning COMPARISON:  CT perfusion performed Apr 23, 2024. CLINICAL DATA:  63 year old female with acute ischemic stroke with symptoms of right motor dysfunction sensory deficits and dysarthria with an NIH stroke scale measuring 14. CT imaging demonstrated a severe proximal left internal carotid artery stenosis and perfusion imaging suggested hypoperfusion to the left hemisphere. After discussing the case in a multidisciplinary fashion, and confirming that there was no immediate available decision maker, the patient was transferred for thrombectomy and emergent 2 physician consent was documented. INDICATION: Acute ischemic ischemic stroke with left hemi severe hypoperfusion. ANESTHESIA/SEDATION: General anesthesia was utilized for the procedure. CONTRAST:  Approximately 75 cc Omnipaque  300 320 MEDICATIONS: Heparin  6000 units intravenously Brilinta 180 mg per tube FLUOROSCOPY TIME:  Fluoroscopy Time: 15.8 minutes (737 mGy). COMPLICATIONS: None immediate. BODY OF REPORT: The patient was then put under general anesthesia by the Department of Anesthesiology at [facility]. Insert ultrasound access The right groin was prepped and draped in the usual sterile fashion. Ultrasound was used to study the right common femoral artery which was patent. Using real-time ultrasound guidance, a 19 gauge introducer needle was used to access the right common femoral artery. Access was performed at 1357. A hard copy image ultrasound the same date uncertain PACS. Using this access, a 6 French sheath was placed in the descending thoracic aorta. Approximately 5000 units of heparin  was administered intravenously. Next, selective catheterization the left  common carotid artery was performed. A selective arteriogram was performed which demonstrated that the internal carotid artery was patent. But demonstrated a severe focal stenosis estimated at greater than 80% in the single frontal plane of imaging. This was use a lies as it  best profiled the bifurcation between the internal carotid artery and external carotid artery. There was no evidence of intracranial filling defect. Scattered mild diffuse atherosclerotic changes are present. Next, the lesion was crossed with a catheter and guidewire. At this point, my tempted to deployed embolic protection device, however, I could not safely deployed the device in the proximal internal carotid artery secondary to tortuosity and concern for device detachment. Therefore, the proximal stenosis was treated with balloon angioplasty using a 4 mm noncompliant balloon. This was considered the first pass for the purposes of documentation and this was performed at 14:20. Next, a precise 6 mm x 40 mm self expanding stent was deployed in the proximal left internal carotid artery extending into the common carotid artery. Deployment was completed at 14:50 and this was considered recanalization. There was a residual waist and therefore stent dilation was performed using a 5 mm noncompliant balloon. A post treatment arteriogram demonstrated no evidence of significant residual proximal stenosis. There was good apposition of the bare metal stent. There was persistent filling of the left external carotid artery. The intracranial arteries were improved in terms of caliber. There was no evidence of filling defect to suggest peripheral embolus. I elected to terminate the procedure at this point. The revascularization score was graded at TICI-3 Evaluation of the right femoral access site demonstrated at this site was suitable for closure device. A 6 French Angio-Seal device was deployed without complication. Become beam CT was then performed to evaluate for intracranial hemorrhage and treatment planning. This demonstrated no evidence significant acute intracranial hemorrhage. Fluoroscopic imaging confirmed placement of an enteric tube within the stomach. At this point, 180 mg of Brilinta was administered per tube. The  patient was then transferred to the ICU in stable condition while remaining intubated. IMPRESSION: 1. Emergent left internal carotid artery stent placement due to clinical concern for is global left-sided hypoperfusion and developing stroke in the setting of severe left internal carotid artery stenosis. PLAN: 1. To ICU for routine postoperative supportive care. 2. The patient will continue on a heparin  infusion intravenously at 500 units/hour for the next 6 hours. At that point, heparin  will be discontinued. 3. The patient has received a bolus dose of aspirin  PR and a bolus dose of 180 mg of Brilinta per tube. Electronically Signed   By: Reagan Camera M.D.   On: 04/23/2024 16:31   IR CT Head Ltd Result Date: 04/23/2024 PROCEDURE PERFORMED: 1. Stroke thrombectomy 2. Ultrasound vascular access 3. Cone beam CT for treatment planning COMPARISON:  CT perfusion performed Apr 23, 2024. CLINICAL DATA:  63 year old female with acute ischemic stroke with symptoms of right motor dysfunction sensory deficits and dysarthria with an NIH stroke scale measuring 14. CT imaging demonstrated a severe proximal left internal carotid artery stenosis and perfusion imaging suggested hypoperfusion to the left hemisphere. After discussing the case in a multidisciplinary fashion, and confirming that there was no immediate available decision maker, the patient was transferred for thrombectomy and emergent 2 physician consent was documented. INDICATION: Acute ischemic ischemic stroke with left hemi severe hypoperfusion. ANESTHESIA/SEDATION: General anesthesia was utilized for the procedure. CONTRAST:  Approximately 75 cc Omnipaque  300 320 MEDICATIONS: Heparin  6000 units intravenously Brilinta 180 mg per tube FLUOROSCOPY TIME:  Fluoroscopy Time: 15.8 minutes (737 mGy). COMPLICATIONS: None immediate. BODY OF REPORT: The patient was then put under general anesthesia by the Department of Anesthesiology at [facility]. Insert ultrasound  access The right groin was prepped and draped in the usual sterile fashion. Ultrasound was used to study the right common femoral artery which was patent. Using real-time ultrasound guidance, a 19 gauge introducer needle was used to access the right common femoral artery. Access was performed at 1357. A hard copy image ultrasound the same date uncertain PACS. Using this access, a 6 French sheath was placed in the descending thoracic aorta. Approximately 5000 units of heparin  was administered intravenously. Next, selective catheterization the left common carotid artery was performed. A selective arteriogram was performed which demonstrated that the internal carotid artery was patent. But demonstrated a severe focal stenosis estimated at greater than 80% in the single frontal plane of imaging. This was use a lies as it best profiled the bifurcation between the internal carotid artery and external carotid artery. There was no evidence of intracranial filling defect. Scattered mild diffuse atherosclerotic changes are present. Next, the lesion was crossed with a catheter and guidewire. At this point, my tempted to deployed embolic protection device, however, I could not safely deployed the device in the proximal internal carotid artery secondary to tortuosity and concern for device detachment. Therefore, the proximal stenosis was treated with balloon angioplasty using a 4 mm noncompliant balloon. This was considered the first pass for the purposes of documentation and this was performed at 14:20. Next, a precise 6 mm x 40 mm self expanding stent was deployed in the proximal left internal carotid artery extending into the common carotid artery. Deployment was completed at 14:50 and this was considered recanalization. There was a residual waist and therefore stent dilation was performed using a 5 mm noncompliant balloon. A post treatment arteriogram demonstrated no evidence of significant residual proximal stenosis. There  was good apposition of the bare metal stent. There was persistent filling of the left external carotid artery. The intracranial arteries were improved in terms of caliber. There was no evidence of filling defect to suggest peripheral embolus. I elected to terminate the procedure at this point. The revascularization score was graded at TICI-3 Evaluation of the right femoral access site demonstrated at this site was suitable for closure device. A 6 French Angio-Seal device was deployed without complication. Become beam CT was then performed to evaluate for intracranial hemorrhage and treatment planning. This demonstrated no evidence significant acute intracranial hemorrhage. Fluoroscopic imaging confirmed placement of an enteric tube within the stomach. At this point, 180 mg of Brilinta was administered per tube. The patient was then transferred to the ICU in stable condition while remaining intubated. IMPRESSION: 1. Emergent left internal carotid artery stent placement due to clinical concern for is global left-sided hypoperfusion and developing stroke in the setting of severe left internal carotid artery stenosis. PLAN: 1. To ICU for routine postoperative supportive care. 2. The patient will continue on a heparin  infusion intravenously at 500 units/hour for the next 6 hours. At that point, heparin  will be discontinued. 3. The patient has received a bolus dose of aspirin  PR and a bolus dose of 180 mg of Brilinta per tube. Electronically Signed   By: Reagan Camera M.D.   On: 04/23/2024 16:31   CT CEREBRAL PERFUSION W CONTRAST Result Date: 04/23/2024 CLINICAL DATA:  63 year old female code stroke presentation, profound unilateral lower extremity weakness. EXAM: CT PERFUSION BRAIN TECHNIQUE: Multiphase CT imaging  of the brain was performed following IV bolus contrast injection. Subsequent parametric perfusion maps were calculated using RAPID software. RADIATION DOSE REDUCTION: This exam was performed according to the  departmental dose-optimization program which includes automated exposure control, adjustment of the mA and/or kV according to patient size and/or use of iterative reconstruction technique. CONTRAST:  50mL OMNIPAQUE  IOHEXOL  350 MG/ML SOLN COMPARISON:  CTA 1207 hours today. FINDINGS: FINDINGS ASPECTS: 10 CBF (<30%) Volume: 0mL. No CBF ir CBV parameter abnormality detected. Perfusion (Tmax>6.0s) volume: 0mL however, 88 mL of T-max greater than 4s is detected which is mostly (although not entirely) in the left hemisphere. Mismatch Volume: Not applicablemL Infarction Location:Not applicable IMPRESSION: 1. NO infarct core or penumbra detected by standard CTP parameters. 2. Suggestion of asymmetric oligemia in the left hemisphere (T-max > 4s), which might relate to the severe ICA stenoses described on CTA today. Electronically Signed   By: Marlise Simpers M.D.   On: 04/23/2024 13:04   CT Angio Chest/Abd/Pel for Dissection W and/or W/WO Result Date: 04/23/2024 CLINICAL DATA:  Weakness and back pain. Acute aortic syndrome suspected. EXAM: CT ANGIOGRAPHY CHEST, ABDOMEN AND PELVIS TECHNIQUE: Non-contrast CT of the chest was initially obtained. Multidetector CT imaging through the chest, abdomen and pelvis was performed using the standard protocol during bolus administration of intravenous contrast. Multiplanar reconstructed images and MIPs were obtained and reviewed to evaluate the vascular anatomy. RADIATION DOSE REDUCTION: This exam was performed according to the departmental dose-optimization program which includes automated exposure control, adjustment of the mA and/or kV according to patient size and/or use of iterative reconstruction technique. CONTRAST:  OMNIPAQUE  IOHEXOL  350 MG/ML SOLN COMPARISON:  CT abdomen pelvis dated 02/09/2022. FINDINGS: Evaluation is limited due to streak artifact caused by patient's arms and overlying support wires. CTA CHEST FINDINGS Cardiovascular: Mild cardiomegaly. No pericardial effusion.  There is mild atherosclerotic calcification of the thoracic aorta. No aneurysmal dilatation or dissection. The origins of the great vessels of the aortic arch appear patent. No pulmonary artery embolus identified. Mediastinum/Nodes: No hilar or mediastinal adenopathy. There is circumferential thickening of the mid esophagus concerning for esophagitis. Clinical correlation is recommended. An infiltrative process is not excluded. No mediastinal fluid collection. Lungs/Pleura: There are minimal bibasilar linear atelectasis. No focal consolidation, pleural effusion, or pneumothorax. The central airways are patent. Musculoskeletal: Median sternotomy wires. No acute osseous pathology. Review of the MIP images confirms the above findings. CTA ABDOMEN AND PELVIS FINDINGS VASCULAR Aorta: Advanced atherosclerotic calcification of the abdominal aorta. No aneurysmal dilatation or dissection. No periaortic fluid collection. Celiac: Atherosclerotic calcification of the origin of the celiac artery. The celiac artery and its major branches are patent. SMA: The SMA is patent. Renals: There is atherosclerotic calcification of the origins of the renal arteries with luminal narrowing. There is diminished flow in the renal arteries bilaterally. The renal arteries however remain patent. IMA: Atherosclerotic calcification of the IMA with diminished flow. Focal area of high-grade narrowing of the proximal IMA with reconstitution of flow distal to this segment. Inflow: Advanced atherosclerotic calcification of the iliac arteries. The iliac arteries are patent. Veins: The IVC is unremarkable.  No portal venous gas. Review of the MIP images confirms the above findings. NON-VASCULAR No intra-abdominal free air.  Small perihepatic ascites. Hepatobiliary: The liver is unremarkable. No biliary dilatation. The gallbladder is unremarkable. Pancreas: Unremarkable. No pancreatic ductal dilatation or surrounding inflammatory changes. Spleen: Normal in  size without focal abnormality. Adrenals/Urinary Tract: The adrenal glands are unremarkable. Moderate bilateral renal parenchyma atrophy. No hydronephrosis.  There is stranding of the perinephric and renal sinus fat. Correlation with urinalysis recommended to exclude UTI. The urinary bladder is collapsed. Stomach/Bowel: Mild sigmoid diverticulosis. There is no bowel obstruction or active inflammation. The appendix is normal. Lymphatic: No adenopathy. Reproductive: The uterus is grossly unremarkable. No suspicious adnexal masses. Other: None Musculoskeletal: No acute or significant osseous findings. Review of the MIP images confirms the above findings. IMPRESSION: 1. No aortic aneurysm or dissection. 2. Circumferential thickening of the mid esophagus concerning for esophagitis. Clinical correlation is recommended. 3. Mild sigmoid diverticulosis. No bowel obstruction. Normal appendix. 4. Moderate bilateral renal parenchyma atrophy. No hydronephrosis. Correlation with urinalysis recommended to exclude UTI. Electronically Signed   By: Angus Bark M.D.   On: 04/23/2024 12:44   CT ANGIO HEAD NECK W WO CM (CODE STROKE) Result Date: 04/23/2024 CLINICAL DATA:  63 year old female code stroke presentation. Possible left MCA syndrome. Dialysis patient. EXAM: CT ANGIOGRAPHY HEAD AND NECK WITH AND WITHOUT CONTRAST TECHNIQUE: Multidetector CT imaging of the head and neck was performed using the standard protocol during bolus administration of intravenous contrast. Multiplanar CT image reconstructions and MIPs were obtained to evaluate the vascular anatomy. Carotid stenosis measurements (when applicable) are obtained utilizing NASCET criteria, using the distal internal carotid diameter as the denominator. RADIATION DOSE REDUCTION: This exam was performed according to the departmental dose-optimization program which includes automated exposure control, adjustment of the mA and/or kV according to patient size and/or use of  iterative reconstruction technique. CONTRAST:  50mL OMNIPAQUE  IOHEXOL  350 MG/ML SOLN COMPARISON:  Head CT 1158 hours today. FINDINGS: CTA NECK Skeleton: Reversal of the normal cervical lordosis. Previous sternotomy. Mostly absent dentition. Upper thoracic spina bifida occulta. No acute osseous abnormality identified. Upper chest: Previous CABG. Other neck: Partially retropharyngeal carotids. Nonvascular neck soft tissue spaces are within normal limits. Aortic arch: Calcified aortic atherosclerosis. Bovine arch configuration. Right carotid system: Brachiocephalic artery and proximal right CCA are patent with mild plaque and no stenosis. Tortuous, partially retropharyngeal right CCA. Calcified plaque at the right ICA origin without significant stenosis. Soft and calcified plaque at the bulb or just distal to the bulb with high-grade stenosis (series 2, image 94 and series 6, image 68) approaching a radiographic string sign, numerically estimated at 70-80 % with respect to the distal vessel. The vessel remains patent to the skull base. Left carotid system: Left CCA origin plaque with less than 50% stenosis. Tortuous left CCA. Soft and calcified plaque before the bifurcation. Severe plaque at the left carotid bifurcation with radiographic string sign stenosis series 5, image 133. But the vessel remains patent. Vertebral arteries: Right subclavian origin calcified plaque without stenosis. Diminutive and atherosclerotic right vertebral artery origin and V1 segment with at least moderate stenosis. Intermittent right V2 segment plaque, but the right vertebral artery caliber improves to the skull base with no additional significant stenosis. Left subclavian origin plaque without stenosis. Calcified plaque at the left vertebral artery origin with similar mild to moderate stenosis. Codominant left vertebral artery with intermittent V2 and V3 segment plaque but no other significant stenosis to the skull base. CTA HEAD Posterior  circulation: Atherosclerotic distal vertebral arteries and vertebrobasilar junction. Dominant left V4. PICA origins remain patent. Mild distal vertebral and vertebrobasilar stenosis. Patent basilar artery without stenosis. Patent SCA and PCA origins. Small posterior communicating arteries. Bilateral PCA branches are within normal limits. Anterior circulation:  Both ICA siphons are patent. Severe calcified atherosclerosis and stenosis of the cavernous left ICA on series 6, image 102, string sign  stenosis. Moderate additional left supraclinoid plaque. The vessel remains patent. Left posterior communicating artery origin is normal. Similar severe calcified right siphon plaque and stenosis. Distal right ICA remains patent with moderate additional supraclinoid plaque and stenosis. Patent carotid termini, MCA and ACA origins. Normal anterior communicating artery. Bilateral ACA branches are within normal limits. Right MCA M1 segment and bifurcation are patent without stenosis. No right MCA branch occlusion is identified. Left MCA M1 segment is patent without stenosis. There is a saccular 2-3 mm left MCA bifurcation aneurysm on series 6, image 112, directed anteriorly and laterally. Left MCA bifurcation remains patent. No left MCA branch occlusion is identified. Venous sinuses: Not evaluated due to early contrast timing. Anatomic variants: Dominant left vertebral artery. Review of the MIP images confirms the above findings IMPRESSION: 1. Negative for large vessel occlusion, But Positive for VERY SEVERE underlying atherosclerosis with: - LICA origin and bilateral ICA siphon RADIOGRAPHIC STRING SIGN STENOSES. - Right ICA distal bulb high-grade stenosis, estimated 70-80% and approaching a radiographic string sign. 2. 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 3. Less pronounced posterior circulation, bilateral vertebral artery atherosclerosis but still evidence of up to moderate bilateral vertebral  artery stenoses. 4.  Aortic Atherosclerosis (ICD10-I70.0). Salient findings discussed by telephone with Dr. Doretta Gant on 04/23/2024 at 12:25 . Electronically Signed   By: Marlise Simpers M.D.   On: 04/23/2024 12:27   CT HEAD CODE STROKE WO CONTRAST Result Date: 04/23/2024 CLINICAL DATA:  Code stroke. 63 year old female. Dialysis patient. Pain. EXAM: CT HEAD WITHOUT CONTRAST TECHNIQUE: Contiguous axial images were obtained from the base of the skull through the vertex without intravenous contrast. RADIATION DOSE REDUCTION: This exam was performed according to the departmental dose-optimization program which includes automated exposure control, adjustment of the mA and/or kV according to patient size and/or use of iterative reconstruction technique. COMPARISON:  Head CT,Brain MRI 2024-01-06. FINDINGS: Brain: Pronounced chronic dural calcification, stable. Stable cerebral volume. No midline shift, ventriculomegaly, mass effect, evidence of mass lesion, intracranial hemorrhage or evidence of cortically based acute infarction. Stable gray-white matter differentiation throughout the brain, relatively normal for age. Vascular: Chronic severe calcification. Skull: Stable visualized osseous structures. Sinuses/Orbits: Severe chronic paranasal sinusitis with mucoperiosteal thickening, chronically eroded posterior septum and sphenoid septum. Stable appearance since December. Tympanic cavities and mastoids remain well aerated. Other: No acute orbit or scalp soft tissue finding. ASPECTS [facility] Stroke Program Early CT Score) Total score (0-10 with 10 being normal): 10 IMPRESSION: 1. Stable noncontrast CT appearance of the brain since December, remarkable for unusually advanced dural calcification. ASPECTS 10. No acute intracranial hemorrhage. 2. Severe chronic paranasal sinus disease. 3. These results were communicated to Dr. Doretta Gant at 12:11 pm on 04/23/2024 by text page via the [facility] messaging system. Electronically Signed   By: Marlise Simpers M.D.    On: 04/23/2024 12:12   Medications:   amLODipine   10 mg Oral Daily   aspirin   81 mg Oral Daily   atorvastatin   80 mg Oral Daily   Chlorhexidine  Gluconate Cloth  6 each Topical Q0600   ezetimibe  10 mg Oral Daily   gabapentin   300 mg Oral QHS   heparin  injection (subcutaneous)  5,000 Units Subcutaneous Q8H   insulin  aspart  0-5 Units Subcutaneous QHS   insulin  aspart  0-6 Units Subcutaneous TID WC   mirtazapine   30 mg Oral QHS   ticagrelor  90 mg Oral BID   topiramate   50 mg Oral Daily   traZODone   50  mg Oral QHS    Assessment/Plan **acute CVA s/p thrombectomy and L ICA stenting:  per stroke team.   **ESRD on HD: usual schedule MWF, missed Mon due to CVA - Had HD Tues and will resume usual MWF schedule sometime prior to d/c.  Sounds like she's going to CIR so anticipate HD Thurs, Sat then MWF thereafter.     **HTN: BP per stroke team.  Per notes goal BP 120-160 - will set parameters for HD.     **HFrEF:  volume management with HD.  Looking euvolemic, do not think dyspnea related.    **DM type 2: per primary   **Anemia: Hb 11s, monitor.   **Dyspnea: c/o needing nebulizer, says h/o asthma.  Does not appear to be fluid related. Alerted RN for need for neb.    Will follow reach out with concerns.   Adrian Alba MD 04/25/2024, 11:42 AM  [facility] Pager: [phone]

## 2024-04-25 NOTE — Progress Notes (Signed)
 Pt receives out-pt HD at [facility] on MWF. Pt arrives around 10:15 am for 10:25 am chair time. Will assist as needed.   Lauraine Polite Renal Navigator [phone]

## 2024-04-25 NOTE — Progress Notes (Signed)
   04/24/24 2345  Vitals  Temp 98.7 F (37.1 C)  Temp Source Oral  BP (!) 146/64  MAP (mmHg) 87  BP Location Left Arm  BP Method Automatic  Patient Position (if appropriate) Lying  Pulse Rate 62  Pulse Rate Source Monitor  ECG Heart Rate 62  Resp 18  Oxygen Therapy  SpO2 100 %  O2 Device Nasal Cannula  O2 Flow Rate (L/min) 2 L/min  Patient Activity (if Appropriate) In bed  During Treatment Monitoring  Blood Flow Rate (mL/min) 350 mL/min  Dialysate Potassium Concentration 2  Dialysate Calcium  Concentration 2.5  Cumulative Fluid Removed (mL) per Treatment  1700  HD Safety Checks Performed Yes  Intra-Hemodialysis Comments Tx completed  Post Treatment  Dialyzer Clearance Lightly streaked  Liters Processed 57.1  Fluid Removed (mL) 1700 mL  Tolerated HD Treatment Yes  Post-Hemodialysis Comments  (pt is alert oriented x4)  AVG/AVF Arterial Site Held (minutes) 10 minutes  AVG/AVF Venous Site Held (minutes) 8 minutes  Fistula / Graft Right Upper arm Arteriovenous vein graft  Placement Date/Time: 05/13/21 1300   Orientation: Right  Access Location: Upper arm  Access Type: Arteriovenous vein graft  Site Condition No complications  Fistula / Graft Assessment Thrill;Bruit;Present  Status Deaccessed  Needle Size 15  Drainage Description None

## 2024-04-25 NOTE — Procedures (Signed)
 Patient Name: Terry Arroyo  MRN: [id number]  Epilepsy Attending: Arleene Lack  Referring Physician/Provider: Colon Dear, NP  Date: 04/25/2024 Duration: 25.36 mins  Patient history: 63 year old female with acute ischemic stroke getting EEG to evaluate for seizure.  Level of alertness: Awake  AEDs during EEG study: GBP, TPM  Technical aspects: This EEG study was done with scalp electrodes positioned according to the 10-20 International system of electrode placement. Electrical activity was reviewed with band pass filter of 1-70Hz , sensitivity of 7 uV/mm, display speed of 14mm/sec with a 60Hz  notched filter applied as appropriate. EEG data were recorded continuously and digitally stored.  Video monitoring was available and reviewed as appropriate.  Description: The posterior dominant rhythm consists of 8 Hz activity of moderate voltage (25-35 uV) seen predominantly in posterior head regions, symmetric and reactive to eye opening and eye closing. Hyperventilation and photic stimulation were not performed.     IMPRESSION: This study is within normal limits. No seizures or epileptiform discharges were seen throughout the recording.  A normal interictal EEG does not exclude the diagnosis of epilepsy.  [name] O Taziyah Iannuzzi

## 2024-04-25 NOTE — Progress Notes (Signed)
 PT Cancellation Note  Patient Details Name: Terry Arroyo MRN: [id number] DOB: January 23, 1961   Cancelled Treatment:    Reason Eval/Treat Not Completed: Patient at procedure or test/unavailable (Pt with EEG team. Will follow up later if time allows.)   Humna Moorehouse 04/25/2024, 9:38 AM

## 2024-04-26 DIAGNOSIS — I6523 Occlusion and stenosis of bilateral carotid arteries: Principal | ICD-10-CM

## 2024-04-26 DIAGNOSIS — I639 Cerebral infarction, unspecified: Secondary | ICD-10-CM | POA: Diagnosis not present

## 2024-04-26 DIAGNOSIS — R29704 NIHSS score 4: Secondary | ICD-10-CM | POA: Diagnosis not present

## 2024-04-26 DIAGNOSIS — I132 Hypertensive heart and chronic kidney disease with heart failure and with stage 5 chronic kidney disease, or end stage renal disease: Secondary | ICD-10-CM | POA: Diagnosis not present

## 2024-04-26 DIAGNOSIS — I509 Heart failure, unspecified: Secondary | ICD-10-CM

## 2024-04-26 LAB — CBC
HCT: 31.3 % — ABNORMAL LOW (ref 36.0–46.0)
Hemoglobin: 10.3 g/dL — ABNORMAL LOW (ref 12.0–15.0)
MCH: 29.5 pg (ref 26.0–34.0)
MCHC: 32.9 g/dL (ref 30.0–36.0)
MCV: 89.7 fL (ref 80.0–100.0)
Platelets: 190 10*3/uL (ref 150–400)
RBC: 3.49 MIL/uL — ABNORMAL LOW (ref 3.87–5.11)
RDW: 14.5 % (ref 11.5–15.5)
WBC: 8.2 10*3/uL (ref 4.0–10.5)
nRBC: 0 % (ref 0.0–0.2)

## 2024-04-26 LAB — RENAL FUNCTION PANEL
Albumin: 3.4 g/dL — ABNORMAL LOW (ref 3.5–5.0)
Anion gap: 15 (ref 5–15)
BUN: 62 mg/dL — ABNORMAL HIGH (ref 8–23)
CO2: 22 mmol/L (ref 22–32)
Calcium: 8.7 mg/dL — ABNORMAL LOW (ref 8.9–10.3)
Chloride: 95 mmol/L — ABNORMAL LOW (ref 98–111)
Creatinine, Ser: 9.03 mg/dL — ABNORMAL HIGH (ref 0.44–1.00)
GFR, Estimated: 5 mL/min — ABNORMAL LOW (ref >60)
Glucose, Bld: 176 mg/dL — ABNORMAL HIGH (ref 70–99)
Phosphorus: 5.8 mg/dL — ABNORMAL HIGH (ref 2.5–4.6)
Potassium: 4.4 mmol/L (ref 3.5–5.1)
Sodium: 132 mmol/L — ABNORMAL LOW (ref 135–145)

## 2024-04-26 LAB — GLUCOSE, CAPILLARY
Glucose-Capillary: 206 mg/dL — ABNORMAL HIGH (ref 70–99)
Glucose-Capillary: 91 mg/dL (ref 70–99)
Glucose-Capillary: 168 mg/dL — ABNORMAL HIGH (ref 70–99)

## 2024-04-26 MED ORDER — ALTEPLASE 2 MG IJ SOLR: 2.0000 mg | Freq: Once | INTRAMUSCULAR | Status: DC | PRN

## 2024-04-26 MED ORDER — LIDOCAINE HCL (PF) 1 % IJ SOLN: 5.0000 mL | INTRAMUSCULAR | Status: DC | PRN

## 2024-04-26 MED ORDER — IPRATROPIUM-ALBUTEROL 0.5-2.5 (3) MG/3ML IN SOLN
RESPIRATORY_TRACT | Status: AC
  Filled 2024-04-26: qty 3

## 2024-04-26 MED ORDER — HEPARIN SODIUM (PORCINE) 1000 UNIT/ML DIALYSIS: 1000.0000 [IU] | INTRAMUSCULAR | Status: DC | PRN

## 2024-04-26 MED ORDER — ANTICOAGULANT SODIUM CITRATE 4% (200MG/5ML) IV SOLN: 5.0000 mL | Status: DC | PRN

## 2024-04-26 MED ORDER — PENTAFLUOROPROP-TETRAFLUOROETH EX AERO: 1.0000 | INHALATION_SPRAY | CUTANEOUS | Status: DC | PRN

## 2024-04-26 MED ORDER — LIDOCAINE-PRILOCAINE 2.5-2.5 % EX CREA: 1.0000 | TOPICAL_CREAM | CUTANEOUS | Status: DC | PRN

## 2024-04-26 MED ORDER — ONDANSETRON HCL 4 MG/2ML IJ SOLN
4.0000 mg | Freq: Four times a day (QID) | INTRAMUSCULAR | Status: DC | PRN
  Administered 2024-04-27: 4 mg via INTRAVENOUS
  Filled 2024-04-26 (×2): qty 2

## 2024-04-26 NOTE — Plan of Care (Signed)
 Said she is going to work hard with therapy to get better and stronger because she does not want to go to a nursing home for therapy but said she would rather go home and work hard to get better  Problem: Ischemic Stroke/TIA Tissue Perfusion: Goal: Complications of ischemic stroke/TIA will be minimized Outcome: Progressing   Problem: Education: Goal: Knowledge of patient specific risk factors will improve (DELETE if not current risk factor) Outcome: Progressing   Problem: Education: Goal: Knowledge of secondary prevention will improve (MUST DOCUMENT ALL) Outcome: Progressing   Problem: Nutrition: Goal: Risk of aspiration will decrease Outcome: Progressing

## 2024-04-26 NOTE — Progress Notes (Signed)
 PT Cancellation Note  Patient Details Name: Terry Arroyo MRN: [id number] DOB: 1961-10-25   Cancelled Treatment:    Reason Eval/Treat Not Completed: Patient at procedure or test/unavailable (Pt currently in HD. WIll continue to follow up as able and appropriate.)  Sloan Duncans, DPT, CLT  Acute Rehabilitation Services Office: [phone] (Secure chat preferred)   Jenice Mitts 04/26/2024, 1:05 PM

## 2024-04-26 NOTE — Progress Notes (Addendum)
 STROKE TEAM PROGRESS NOTE    SIGNIFICANT HOSPITAL EVENTS 5/5: Patient admitted with aphasia and right-sided weakness, found to have severe left ICA stenosis with string sign and taken for emergent left ICA stenting  INTERIM HISTORY/SUBJECTIVE Patient has remained hemodynamically stable and afebrile. MRI was negative for acute infarct.  Patient still has some right-sided weakness. Nebs added. EEG negative. Nephrology following. Awaiting SNF placement  Seen in dialysis today.  OBJECTIVE  CBC    Component Value Date/Time   WBC 8.3 04/25/2024 0529   RBC 3.61 (L) 04/25/2024 0529   HGB 10.5 (L) 04/25/2024 0529   HGB 12.6 11/18/2013 0138   HCT 32.1 (L) 04/25/2024 0529   HCT 37.9 11/18/2013 0138   PLT 172 04/25/2024 0529   PLT 444 (H) 11/18/2013 0138   MCV 88.9 04/25/2024 0529   MCV 83 11/18/2013 0138   MCH 29.1 04/25/2024 0529   MCHC 32.7 04/25/2024 0529   RDW 14.6 04/25/2024 0529   RDW 14.1 11/18/2013 0138   LYMPHSABS 1.3 04/23/2024 1230   MONOABS 0.6 04/23/2024 1230   EOSABS 0.4 04/23/2024 1230   BASOSABS 0.1 04/23/2024 1230    BMET    Component Value Date/Time   NA 133 (L) 04/25/2024 0529   NA 134 (L) 11/18/2013 0138   K 3.9 04/25/2024 0529   K 3.8 11/18/2013 0138   CL 97 (L) 04/25/2024 0529   CL 98 11/18/2013 0138   CO2 22 04/25/2024 0529   CO2 29 11/18/2013 0138   GLUCOSE 143 (H) 04/25/2024 0529   GLUCOSE 348 (H) 11/18/2013 0138   BUN 43 (H) 04/25/2024 0529   BUN 11 11/18/2013 0138   CREATININE 7.09 (H) 04/25/2024 0529   CREATININE 0.87 11/18/2013 0138   CALCIUM  8.6 (L) 04/25/2024 0529   CALCIUM  9.4 11/18/2013 0138   GFRNONAA 6 (L) 04/25/2024 0529   GFRNONAA >60 11/18/2013 0138    IMAGING past 24 hours ECHOCARDIOGRAM COMPLETE Result Date: 04/25/2024    ECHOCARDIOGRAM REPORT   Patient Name:   Terry Arroyo Date of Exam: 04/25/2024 Medical Rec #:  [id number]       Height:       66.0 in Accession #:    [id number]      Weight:       162.0 lb Date of Birth:   1960-12-28        BSA:          1.829 m Patient Age:    63 years        BP:           154/61 mmHg Patient Gender: F               HR:           63 bpm. Exam Location:  Inpatient Procedure: 2D Echo, Color Doppler and Cardiac Doppler (Both Spectral and Color            Flow Doppler were utilized during procedure). Indications:    Stroke  History:        Patient has prior history of Echocardiogram examinations, most                 recent 12/08/2023.  Sonographer:    Andrena Bang Referring Phys: Leandro Proffer DE LA TORRE IMPRESSIONS  1. Left ventricular ejection fraction, by estimation, is 35 to 40%. The left ventricle has moderately decreased function. The left ventricle demonstrates regional wall motion abnormalities (see scoring diagram/findings for description). Left ventricular  diastolic parameters are consistent  with Grade II diastolic dysfunction (pseudonormalization).  2. Right ventricular systolic function is mildly reduced. The right ventricular size is moderately enlarged. There is moderately elevated pulmonary artery systolic pressure. The estimated right ventricular systolic pressure is 50.0 mmHg.  3. Left atrial size was moderately dilated.  4. Right atrial size was moderately dilated.  5. The mitral valve is normal in structure. Mild to moderate mitral valve regurgitation. No evidence of mitral stenosis. Moderate mitral annular calcification.  6. Tricuspid valve regurgitation is moderate.  7. The aortic valve is calcified. There is mild calcification of the aortic valve. There is mild thickening of the aortic valve. Aortic valve regurgitation is not visualized. No aortic stenosis is present. Aortic valve mean gradient measures 6.0 mmHg. Aortic valve Vmax measures 1.81 m/s.  8. The inferior vena cava is dilated in size with >50% respiratory variability, suggesting right atrial pressure of 8 mmHg. Conclusion(s)/Recommendation(s): No intracardiac source of embolism detected on this transthoracic study. Consider a  transesophageal echocardiogram to exclude cardiac source of embolism if clinically indicated. FINDINGS  Left Ventricle: Left ventricular ejection fraction, by estimation, is 35 to 40%. The left ventricle has moderately decreased function. The left ventricle demonstrates regional wall motion abnormalities. The left ventricular internal cavity size was normal in size. There is no left ventricular hypertrophy. Left ventricular diastolic parameters are consistent with Grade II diastolic dysfunction (pseudonormalization).  LV Wall Scoring: The inferior wall and posterior wall are akinetic. Right Ventricle: The right ventricular size is moderately enlarged. No increase in right ventricular wall thickness. Right ventricular systolic function is mildly reduced. There is moderately elevated pulmonary artery systolic pressure. The tricuspid regurgitant velocity is 3.24 m/s, and with an assumed right atrial pressure of 8 mmHg, the estimated right ventricular systolic pressure is 50.0 mmHg. Left Atrium: Left atrial size was moderately dilated. Right Atrium: Right atrial size was moderately dilated. Pericardium: There is no evidence of pericardial effusion. Mitral Valve: The mitral valve is normal in structure. Moderate mitral annular calcification. Mild to moderate mitral valve regurgitation. No evidence of mitral valve stenosis. MV peak gradient, 9.0 mmHg. The mean mitral valve gradient is 3.0 mmHg. Tricuspid Valve: The tricuspid valve is normal in structure. Tricuspid valve regurgitation is moderate . No evidence of tricuspid stenosis. Aortic Valve: The aortic valve is calcified. There is mild calcification of the aortic valve. There is mild thickening of the aortic valve. Aortic valve regurgitation is not visualized. No aortic stenosis is present. Aortic valve mean gradient measures 6.0 mmHg. Aortic valve peak gradient measures 13.1 mmHg. Aortic valve area, by VTI measures 1.21 cm. Pulmonic Valve: The pulmonic valve was  normal in structure. Pulmonic valve regurgitation is not visualized. No evidence of pulmonic stenosis. Aorta: The aortic root is normal in size and structure. Venous: The inferior vena cava is dilated in size with greater than 50% respiratory variability, suggesting right atrial pressure of 8 mmHg. IAS/Shunts: No atrial level shunt detected by color flow Doppler.  LEFT VENTRICLE PLAX 2D LVIDd:         6.00 cm      Diastology LVIDs:         5.10 cm      LV e' medial:    4.24 cm/s LV PW:         0.85 cm      LV E/e' medial:  31.6 LV IVS:        1.15 cm      LV e' lateral:   7.18 cm/s LVOT diam:  1.70 cm      LV E/e' lateral: 18.7 LV SV:         48 LV SV Index:   26 LVOT Area:     2.27 cm  LV Volumes (MOD) LV vol d, MOD A2C: 152.0 ml LV vol d, MOD A4C: 143.0 ml LV vol s, MOD A2C: 82.1 ml LV vol s, MOD A4C: 92.2 ml LV SV MOD A2C:     69.9 ml LV SV MOD A4C:     143.0 ml LV SV MOD BP:      62.8 ml RIGHT VENTRICLE RV S prime:     6.31 cm/s TAPSE (M-mode): 1.1 cm LEFT ATRIUM           Index LA diam:      4.00 cm 2.19 cm/m LA Vol (A2C): 78.2 ml 42.76 ml/m LA Vol (A4C): 44.5 ml 24.33 ml/m  AORTIC VALVE AV Area (Vmax):    1.14 cm AV Area (Vmean):   1.13 cm AV Area (VTI):     1.21 cm AV Vmax:           181.00 cm/s AV Vmean:          111.000 cm/s AV VTI:            0.401 m AV Peak Grad:      13.1 mmHg AV Mean Grad:      6.0 mmHg LVOT Vmax:         90.80 cm/s LVOT Vmean:        55.200 cm/s LVOT VTI:          0.213 m LVOT/AV VTI ratio: 0.53  AORTA Ao Asc diam: 3.10 cm MITRAL VALVE                TRICUSPID VALVE MV Area (PHT): 5.27 cm     TV Peak grad:   42.0 mmHg MV Area VTI:   1.18 cm     TV Vmax:        3.24 m/s MV Peak grad:  9.0 mmHg     TR Peak grad:   42.0 mmHg MV Mean grad:  3.0 mmHg     TR Vmax:        324.00 cm/s MV Vmax:       1.50 m/s MV Vmean:      75.5 cm/s    SHUNTS MV Decel Time: 144 msec     Systemic VTI:  0.21 m MV E velocity: 134.00 cm/s  Systemic Diam: 1.70 cm MV A velocity: 89.40 cm/s MV E/A ratio:   1.50 Dorothye Gathers MD Electronically signed by Dorothye Gathers MD Signature Date/Time: 04/25/2024/3:19:00 PM    Final    VAS US  CAROTID Result Date: 04/25/2024 Carotid Arterial Duplex Study Patient Name:  YARIMA JANDT  Date of Exam:   04/25/2024 Medical Rec #: [id number]        Accession #:    [id number] Date of Birth: 05-Mar-1961         Patient Gender: F Patient Age:   35 years Exam Location:  [facility] Procedure:      VAS US  CAROTID Referring Phys: Margart Shears DE LA TORRE --------------------------------------------------------------------------------  Indications:       CVA. Risk Factors:      Hypertension, hyperlipidemia, prior MI, coronary artery                    disease, prior CVA, PAD. Comparison Study:  04/23/2024 CTA neck- 1. Negative for large vessel  occlusion,                    But Positive for VERY SEVERE underlying atherosclerosis with:                    - LICA origin and bilateral ICA siphon RADIOGRAPHIC STRING                    SIGN STENOSES.                    - Right ICA distal bulb high-grade stenosis, estimated 70-80%                    and                    approaching a radiographic string sign. Performing Technologist: Delford Felling MHA, RDMS, RVT, RDCS  Examination Guidelines: A complete evaluation includes B-mode imaging, spectral Doppler, color Doppler, and power Doppler as needed of all accessible portions of each vessel. Bilateral testing is considered an integral part of a complete examination. Limited examinations for reoccurring indications may be performed as noted.  Right Carotid Findings: +----------+--------+--------+--------+-----------------------+--------+           PSV cm/sEDV cm/sStenosisPlaque Description     Comments +----------+--------+--------+--------+-----------------------+--------+ CCA Prox  123     14                                              +----------+--------+--------+--------+-----------------------+--------+ CCA Distal79      8                heterogenous and smooth         +----------+--------+--------+--------+-----------------------+--------+ ICA Prox  170     36      1-39%   smooth and heterogenous         +----------+--------+--------+--------+-----------------------+--------+ ICA Mid   126     24                                              +----------+--------+--------+--------+-----------------------+--------+ ICA Distal70      12                                              +----------+--------+--------+--------+-----------------------+--------+ ECA       116                                                     +----------+--------+--------+--------+-----------------------+--------+ +----------+--------+-------+--------+--------------------+           PSV cm/sEDV cmsDescribeArm Pressure (mmHG)  +----------+--------+-------+--------+--------------------+ [id number]            StenoticRestricted extremity +----------+--------+-------+--------+--------------------+ +---------+--------+--+--------+-+---------+ VertebralPSV cm/s34EDV cm/s4Antegrade +---------+--------+--+--------+-+---------+  Left Carotid Findings: +----------+--------+--------+--------+------------------+--------+           PSV cm/sEDV cm/sStenosisPlaque DescriptionComments +----------+--------+--------+--------+------------------+--------+ CCA Prox  77      9                                          +----------+--------+--------+--------+------------------+--------+  CCA Distal                                          stent    +----------+--------+--------+--------+------------------+--------+ ICA Prox                                            stent    +----------+--------+--------+--------+------------------+--------+ ICA Mid                                             stent    +----------+--------+--------+--------+------------------+--------+ ICA Distal104     24                                          +----------+--------+--------+--------+------------------+--------+ ECA       287     13                                         +----------+--------+--------+--------+------------------+--------+ +----------+--------+--------+----------------+-------------------+           PSV cm/sEDV cm/sDescribe        Arm Pressure (mmHG) +----------+--------+--------+----------------+-------------------+ [id number]              Multiphasic, [id number]                 +----------+--------+--------+----------------+-------------------+ +---------+--------+---+--------+--+---------+ VertebralPSV cm/s101EDV cm/s23Antegrade +---------+--------+---+--------+--+---------+  Left Stent(s): +---------------+--------+--------+--------+--------+--------+ ICA            PSV cm/sEDV cm/sStenosisWaveformComments +---------------+--------+--------+--------+--------+--------+ Prox to Stent  96      17                               +---------------+--------+--------+--------+--------+--------+ Proximal Stent 82      12                               +---------------+--------+--------+--------+--------+--------+ Mid Stent      227     27                               +---------------+--------+--------+--------+--------+--------+ Distal Stent   101     24                               +---------------+--------+--------+--------+--------+--------+ Distal to Stent104     24                               +---------------+--------+--------+--------+--------+--------+    Summary: Right Carotid: Velocities in the right ICA are consistent with an upper range                1-39% stenosis. Left Carotid: Left ICA stent is patent with elevated velocities suggestive of  50-74% stenosis. Vertebrals:  Bilateral vertebral arteries demonstrate antegrade flow. Subclavians: Right subclavian artery was stenotic. Normal flow hemodynamics were              seen in the  left subclavian artery. *See table(s) above for measurements and observations.     Preliminary    EEG adult Result Date: 04/25/2024 Arleene Lack, MD     04/25/2024 10:18 AM Patient Name: Terry Arroyo MRN: [id number] Epilepsy Attending: Arleene Lack Referring Physician/Provider: Colon Dear, NP Date: 04/25/2024 Duration: 25.36 mins Patient history: 63 year old female with acute ischemic stroke getting EEG to evaluate for seizure. Level of alertness: Awake AEDs during EEG study: GBP, TPM Technical aspects: This EEG study was done with scalp electrodes positioned according to the 10-20 International system of electrode placement. Electrical activity was reviewed with band pass filter of 1-70Hz , sensitivity of 7 uV/mm, display speed of 64mm/sec with a 60Hz  notched filter applied as appropriate. EEG data were recorded continuously and digitally stored.  Video monitoring was available and reviewed as appropriate. Description: The posterior dominant rhythm consists of 8 Hz activity of moderate voltage (25-35 uV) seen predominantly in posterior head regions, symmetric and reactive to eye opening and eye closing. Hyperventilation and photic stimulation were not performed.   IMPRESSION: This study is within normal limits. No seizures or epileptiform discharges were seen throughout the recording. A normal interictal EEG does not exclude the diagnosis of epilepsy. Arleene Lack     Vitals:   04/26/24 0722 04/26/24 0946 04/26/24 0948 04/26/24 0957  BP: (!) 154/70  (!) 151/70 (!) 164/67  Pulse: 66  65 67  Resp: 18  (!) 25 19  Temp: 98.2 F (36.8 C)  98.6 F (37 C)   TempSrc: Oral  Axillary   SpO2: 98%  100% 100%  Weight:  77.3 kg       PHYSICAL EXAM General:  Alert, well-nourished, well-developed patient in no acute distress Psych:  Mood and affect appropriate for situation CV: Regular rate and rhythm on monitor Respiratory:  Regular, unlabored respirations on room air GI: Abdomen  soft and nontender   NEURO:  Mental Status: AA&Ox3, patient is able to give some history with slow responses Speech/Language: speech is without dysarthria or aphasia.    Cranial Nerves:  II: PERRL. Visual fields full.  III, IV, VI: EOMI. Eyelids elevate symmetrically.  V: Sensation is intact to light touch and diminished on the right VII: Subtle right facial droop VIII: hearing intact to voice. IX, X: Phonation is normal.  WN:[id number] shrug 5/5. XII: tongue is midline without fasciculations. Motor: To move all 4 extremities with good antigravity strength but some weakness and drift to the right arm and right leg.  Diminished fine finger movements on the right.  Albeit left over right upper extremity Tone: is normal and bulk is normal Sensation- Intact to light touch bilaterally but slightly diminished on the right Coordination: FTN intact on the left, within limits of weakness on the right Gait- deferred  Most Recent NIH  1a Level of Conscious.: 0 1b LOC Questions: 0 1c LOC Commands: 0 2 Best Gaze: 0 3 Visual: 1 4 Facial Palsy: 1 5a Motor Arm - left: 0 5b Motor Arm - Right: 1 6a Motor Leg - Left: 0 6b Motor Leg - Right: 0 7 Limb Ataxia: 0 8 Sensory: 1 9 Best Language: 0 10 Dysarthria: 0 11 Extinct. and Inatten.: 0 TOTAL: 4   ASSESSMENT/PLAN  Ms. Adrien Alberta  is a 63 y.o. female with history of end-stage renal disease on dialysis, cerebral aneurysm, diabetes, hypertension, hyperlipidemia, DVT, CAD status post CABG, CHF with EF 35 to 40% and pituitary adenoma status postresection admitted for acute onset right-sided weakness and aphasia.  Patient initially presented to her dialysis center and complained of back pain and right arm weakness as well as headache but was then found to be aphasic with dense hemiparesis on the right.  She was outside of the window for TNK but was found to have severe left ICA stenosis and was taken to interventional radiology for left ICA  stenting.  NIH on Admission 15  Acute ischemic stroke aborted by revascularization of left ICA Etiology: Critical left ICA stenosis Code Stroke CT head No acute abnormality. ASPECTS 10.    CTA head & neck negative for LVO but very severe atherosclerosis with left ICA origin radiographic string sign and bilateral ICA siphon stenosis, right ICA distal bulb high-grade stenosis, 2 to 3 mm left MCA bifurcation saccular aneurysm, moderate bilateral vertebral artery stenosis MRI no acute infarct, chronic dural venous sinus thrombosis with collaterals 2D Echo pending Vas Carotid US - pending  EEG- This study is within normal limits. No seizures or epileptiform discharges were seen throughout the recording. A normal interictal EEG does not exclude the diagnosis of epilepsy. LDL 75 HgbA1c 8.6 VTE prophylaxis -subcutaneous heparin  aspirin  81 mg daily prior to admission, now on aspirin  81 mg daily and Brilinta (ticagrelor) 90 mg bid per interventional radiologist Therapy recommendations: CIR vs SNF Disposition: Pending  Bilateral carotid stenosis Severe bilateral carotid stenosis seen on CT angiogram Stenting of left ICA performed, patient on aspirin  and Brilinta Carotid ultrasound to evaluate right ICA pending Will explore options for right ICA stenosis with neurovascular radiology as an outpatient 1 month after discharge  Hypertension Home meds: Amlodipine  10 mg daily, carvedilol  25 mg twice daily Stable Blood Pressure Goal: SBP 120-160 for first 24 hours then less than 180   Hyperlipidemia Home meds: Atorvastatin  80 mg daily, resumed in hospital LDL 75, goal < 70 Add ezetimibe 10 mg daily Continue statin at discharge  Diabetes type II Uncontrolled Home meds: NovoLog  70/30 15 units 3 times daily with meals HgbA1c 8.6, goal < 7.0 CBGs SSI Recommend close follow-up with PCP for better DM control  End-stage renal disease on dialysis Patient is typically dialyzed Monday Wednesday  Friday Nephrology consulted for inpatient dialysis   Other Stroke Risk Factors Obesity, Body mass index is 27.51 kg/m., BMI >/= 30 associated with increased stroke risk, recommend weight loss, diet and exercise as appropriate  Coronary artery disease Congestive heart failure   Other Active Problems None  Hospital day # 3  Patient seen and examined by NP/APP with MD. MD to update note as needed.   Imogene Mana, DNP, FNP-BC [facility] Pager: [phone]  I have personally obtained history,examined this patient, reviewed notes, independently viewed imaging studies, participated in medical decision making and plan of care.ROS completed by me personally and pertinent positives fully documented  I have made any additions or clarifications directly to the above note. Agree with note above.  Patient neurologically stable to be transferred to rehab or skilled nursing facility when bed available.  Ardella Beaver, MD Medical Director [facility] Pager: [phone] 04/26/2024 3:40 PM  To contact Stroke Continuity provider, please refer to [email or web address]. After hours, contact General Neurology

## 2024-04-26 NOTE — Progress Notes (Addendum)
 Received patient in bed to unit.  Alert and oriented.  Informed consent signed and in chart.   TX duration: 3 hours and 15 minutes  Patient tolerated well.  Transported back to the room  Alert, without acute distress.  Hand-off given to patient's nurse.   Access used: Right AV fistula Access issues: none  Total UF removed: 3L Medication(s) given: Albuterol  (Duoneb)   04/26/24 1324  Vitals  Temp 97.6 F (36.4 C)  Temp Source Oral  BP (!) 153/61  MAP (mmHg) 88  Pulse Rate 78  ECG Heart Rate 77  Resp 20  Oxygen Therapy  SpO2 92 %  O2 Device Room Air  During Treatment Monitoring  Duration of HD Treatment -hour(s) 3.25 hour(s)  HD Safety Checks Performed Yes  Intra-Hemodialysis Comments Tx completed  Dialysis Fluid Bolus Normal Saline  Bolus Amount (mL) 300 mL  Post Treatment  Dialyzer Clearance Clear  Liters Processed 68.2  Fluid Removed (mL) 3000 mL  Tolerated HD Treatment Yes  AVG/AVF Arterial Site Held (minutes) 7 minutes  AVG/AVF Venous Site Held (minutes) 7 minutes  Fistula / Graft Right Upper arm Arteriovenous vein graft  Placement Date/Time: 05/13/21 1300   Orientation: Right  Access Location: Upper arm  Access Type: Arteriovenous vein graft  Status Deaccessed     Luciano Ruths LPN [facility]

## 2024-04-26 NOTE — Progress Notes (Signed)
 Occupational Therapy Treatment Patient Details Name: Terry Arroyo MRN: [id number] DOB: 1961-10-21 Today's Date: 04/26/2024   History of present illness Pt is a 63 y/o female presenting on 5/5 with R sided weakness. CT head negative, CTA with severe L internal carotid artery stenosis and 2 to 3 mm L MCA saccular aneurysm. 5/5 S/P emergent L carotid artery angioplasty and balloon placement. PMH includes: ESRD on HD, pituitary adenoma with visual field defect, CAD s/p CABG, DM2, chronic HFrEF.   OT comments  Pt received just after PT session, reports improvements in RUE although it still remains generally weak despite having good ROM, supplied blue squeezeball to improve grip strength. Pt progressing with Adls, now completing them at a max A to setup level. Utilized ROM and scapular mobilization techniques with reaching activities to work out pt's bilat shoulder stiffness. OT to continue to progress pt as able. DC plans remain appropriate for CIR.       If plan is discharge home, recommend the following:  A little help with walking and/or transfers;A lot of help with bathing/dressing/bathroom;Assistance with cooking/housework;Direct supervision/assist for medications management;Direct supervision/assist for financial management;Assist for transportation;Help with stairs or ramp for entrance;Supervision due to cognitive status   Equipment Recommendations  Other (comment)    Recommendations for Other Services      Precautions / Restrictions Precautions Precautions: Fall Recall of Precautions/Restrictions: Intact Precaution/Restrictions Comments: watch BP (SBP 120-160) Restrictions Weight Bearing Restrictions Per Provider Order: No       Mobility Bed Mobility Overal bed mobility: Modified Independent                  Transfers Overall transfer level: Needs assistance Equipment used: None Transfers: Sit to/from Stand Sit to Stand: Contact guard assist                  Balance Overall balance assessment: Needs assistance Sitting-balance support: Feet supported Sitting balance-Leahy Scale: Good     Standing balance support: Bilateral upper extremity supported, Reliant on assistive device for balance Standing balance-Leahy Scale: Fair                             ADL either performed or assessed with clinical judgement   ADL Overall ADL's : Needs assistance/impaired Eating/Feeding: Independent;Sitting   Grooming: Sitting;Set up;Wash/dry face Grooming Details (indicate cue type and reason): apply lotion to hands with bilat integration Upper Body Bathing: Sitting;Set up       Upper Body Dressing : Minimal assistance;Sitting           Toileting- Clothing Manipulation and Hygiene: Moderate assistance;Sit to/from stand       Functional mobility during ADLs: Minimal assistance;Rolling walker (2 wheels)      Extremity/Trunk Assessment Upper Extremity Assessment RUE Deficits / Details: tremulous with movement, grossly 3-/5 at shoulder, 3/5 elbow and hand.  propioception appears intact but sensation impaired. weak grasp            Vision       Perception     Praxis     Communication Communication Communication: No apparent difficulties   Cognition Arousal: Alert Behavior During Therapy: Flat affect Cognition: Cognition impaired     Awareness: Intellectual awareness intact, Online awareness impaired Memory impairment (select all impairments): Working memory Attention impairment (select first level of impairment): Selective attention Executive functioning impairment (select all impairments): Problem solving  Following commands: Impaired Following commands impaired: Follows one step commands with increased time      Cueing   Cueing Techniques: Verbal cues  Exercises Other Exercises Other Exercises: Scapular mobilitzation to RUE with AAROM shoulder to relieve stiffness. AAROM to L as  well Other Exercises: dynamic reaching with cup stacking RUE. Other Exercises: squeezeball RUE Other Exercises: Finger opposition RUE.    Shoulder Instructions       General Comments bump noted on upper mid back that resembles a knot    Pertinent Vitals/ Pain       Pain Assessment Pain Assessment: Faces Faces Pain Scale: Hurts little more Pain Location: bilat shoulders Pain Descriptors / Indicators: Sore, Tightness Pain Intervention(s): Monitored during session, Limited activity within patient's tolerance, Repositioned, Other (comment) (ROM)  Home Living                                          Prior Functioning/Environment              Frequency  Min 2X/week        Progress Toward Goals  OT Goals(current goals can now be found in the care plan section)  Progress towards OT goals: Progressing toward goals  Acute Rehab OT Goals Patient Stated Goal: get better OT Goal Formulation: With patient Time For Goal Achievement: 05/08/24 Potential to Achieve Goals: Good  Plan      Co-evaluation                 AM-PAC OT "6 Clicks" Daily Activity     Outcome Measure   Help from another person eating meals?: A Little Help from another person taking care of personal grooming?: A Little Help from another person toileting, which includes using toliet, bedpan, or urinal?: A Lot Help from another person bathing (including washing, rinsing, drying)?: A Lot Help from another person to put on and taking off regular upper body clothing?: A Little Help from another person to put on and taking off regular lower body clothing?: A Lot 6 Click Score: 15    End of Session Equipment Utilized During Treatment: Rolling walker (2 wheels);Gait belt  OT Visit Diagnosis: Other abnormalities of gait and mobility (R26.89);Muscle weakness (generalized) (M62.81);Pain;Other symptoms and signs involving cognitive function;Hemiplegia and hemiparesis Hemiplegia -  Right/Left: Right Hemiplegia - dominant/non-dominant: Non-Dominant Pain - part of body:  (back)   Activity Tolerance Patient tolerated treatment well   Patient Left in bed;with call bell/phone within reach;with bed alarm set;with SCD's reapplied   Nurse Communication Mobility status        Time: 6045-4098 OT Time Calculation (min): 18 min  Charges: OT General Charges $OT Visit: 1 Visit OT Treatments $Therapeutic Activity: 8-22 mins  04/26/2024  AB, OTR/L  Acute Rehabilitation Services  Office: [phone]   [name] 04/26/2024, 6:07 PM

## 2024-04-26 NOTE — Care Management Important Message (Signed)
 Important Message  Patient Details  Name: Terry Arroyo MRN: [id number] Date of Birth: May 29, 1961   Important Message Given:  Yes - Medicare IM     Wynonia Hedges 04/26/2024, 4:22 PM

## 2024-04-26 NOTE — Progress Notes (Signed)
 [facility] Progress Note   Subjective:   requesting neb for dyspnea again - not really helping, c/o orthopnea.  Agreeable to attempting 3L UF with HD today  Objective Vitals:   04/26/24 0339 04/26/24 0722 04/26/24 0946 04/26/24 0948  BP: (!) 148/80 (!) 154/70  (!) 151/70  Pulse: 66 66  65  Resp: 16 18  (!) 25  Temp: 97.7 F (36.5 C) 98.2 F (36.8 C)  98.6 F (37 C)  TempSrc: Oral Oral  Axillary  SpO2: 95% 98%  100%  Weight:   77.3 kg    Physical Exam Gen: slightly anxious in bed Eyes: EOMI ENT:MMM Neck: supple CV:  sinus brady on monitor Abd: soft Lungs: clear ant on 2L [place] but slightly inc WOB Extr: RUE AVF +t/b, no LE edema Neuro:awake, conversant, dysarthria but able to communicate, 4/5 strength R upper and lower, L ok Skin: no rashes   Additional Objective Labs: Basic Metabolic Panel: Recent Labs  Lab 04/23/24 1107 04/24/24 0616 04/25/24 0529  NA 135 134* 133*  K 5.0 4.8 3.9  CL 97* 100 97*  CO2 20* 18* 22  GLUCOSE 202* 73 143*  BUN 77* 75* 43*  CREATININE 9.95* 10.29* 7.09*  CALCIUM  8.5* 8.1* 8.6*   Liver Function Tests: Recent Labs  Lab 04/23/24 1107 04/24/24 0616  AST 12* 11*  ALT 8 9  ALKPHOS 83 74  BILITOT 1.5* 0.9  PROT 8.2* 7.2  ALBUMIN  4.1 3.4*   No results for input(s): "LIPASE", "AMYLASE" in the last 168 hours. CBC: Recent Labs  Lab 04/23/24 1107 04/23/24 1230 04/24/24 0616 04/25/24 0529  WBC 8.9 8.7 9.6 8.3  NEUTROABS  --  6.3  --   --   HGB 11.9* 11.9* 11.2* 10.5*  HCT 37.3 37.6 34.1* 32.1*  MCV 92.6 93.1 90.7 88.9  PLT 196 201 186 172   Blood Culture    Component Value Date/Time   SDES BLOOD LEFT WRIST 02/10/2022 2205   SPECREQUEST  02/10/2022 2205    BOTTLES DRAWN AEROBIC AND ANAEROBIC Blood Culture adequate volume   CULT  02/10/2022 2205    NO GROWTH 5 DAYS Performed at [facility], [address] [place], [place] [address]    REPTSTATUS 02/15/2022 FINAL 02/10/2022 2205    Cardiac  Enzymes: No results for input(s): "CKTOTAL", "CKMB", "CKMBINDEX", "TROPONINI" in the last 168 hours. CBG: Recent Labs  Lab 04/25/24 0614 04/25/24 1230 04/25/24 1725 04/25/24 2110 04/26/24 0620  GLUCAP 147* 159* 170* 174* 206*   Iron Studies: No results for input(s): "IRON", "TIBC", "TRANSFERRIN", "FERRITIN" in the last 72 hours. @lablastinr3 @ Studies/Results: ECHOCARDIOGRAM COMPLETE Result Date: 04/25/2024    ECHOCARDIOGRAM REPORT   Patient Name:   JANYTH MARRESE Date of Exam: 04/25/2024 Medical Rec #:  [id number]       Height:       66.0 in Accession #:    [id number]      Weight:       162.0 lb Date of Birth:  03/31/1961        BSA:          1.829 m Patient Age:    62 years        BP:           154/61 mmHg Patient Gender: F               HR:           63 bpm. Exam Location:  Inpatient Procedure: 2D Echo, Color  Doppler and Cardiac Doppler (Both Spectral and Color            Flow Doppler were utilized during procedure). Indications:    Stroke  History:        Patient has prior history of Echocardiogram examinations, most                 recent 12/08/2023.  Sonographer:    Andrena Bang Referring Phys: Leandro Proffer DE LA TORRE IMPRESSIONS  1. Left ventricular ejection fraction, by estimation, is 35 to 40%. The left ventricle has moderately decreased function. The left ventricle demonstrates regional wall motion abnormalities (see scoring diagram/findings for description). Left ventricular  diastolic parameters are consistent with Grade II diastolic dysfunction (pseudonormalization).  2. Right ventricular systolic function is mildly reduced. The right ventricular size is moderately enlarged. There is moderately elevated pulmonary artery systolic pressure. The estimated right ventricular systolic pressure is 50.0 mmHg.  3. Left atrial size was moderately dilated.  4. Right atrial size was moderately dilated.  5. The mitral valve is normal in structure. Mild to moderate mitral valve regurgitation. No evidence of  mitral stenosis. Moderate mitral annular calcification.  6. Tricuspid valve regurgitation is moderate.  7. The aortic valve is calcified. There is mild calcification of the aortic valve. There is mild thickening of the aortic valve. Aortic valve regurgitation is not visualized. No aortic stenosis is present. Aortic valve mean gradient measures 6.0 mmHg. Aortic valve Vmax measures 1.81 m/s.  8. The inferior vena cava is dilated in size with >50% respiratory variability, suggesting right atrial pressure of 8 mmHg. Conclusion(s)/Recommendation(s): No intracardiac source of embolism detected on this transthoracic study. Consider a transesophageal echocardiogram to exclude cardiac source of embolism if clinically indicated. FINDINGS  Left Ventricle: Left ventricular ejection fraction, by estimation, is 35 to 40%. The left ventricle has moderately decreased function. The left ventricle demonstrates regional wall motion abnormalities. The left ventricular internal cavity size was normal in size. There is no left ventricular hypertrophy. Left ventricular diastolic parameters are consistent with Grade II diastolic dysfunction (pseudonormalization).  LV Wall Scoring: The inferior wall and posterior wall are akinetic. Right Ventricle: The right ventricular size is moderately enlarged. No increase in right ventricular wall thickness. Right ventricular systolic function is mildly reduced. There is moderately elevated pulmonary artery systolic pressure. The tricuspid regurgitant velocity is 3.24 m/s, and with an assumed right atrial pressure of 8 mmHg, the estimated right ventricular systolic pressure is 50.0 mmHg. Left Atrium: Left atrial size was moderately dilated. Right Atrium: Right atrial size was moderately dilated. Pericardium: There is no evidence of pericardial effusion. Mitral Valve: The mitral valve is normal in structure. Moderate mitral annular calcification. Mild to moderate mitral valve regurgitation. No evidence  of mitral valve stenosis. MV peak gradient, 9.0 mmHg. The mean mitral valve gradient is 3.0 mmHg. Tricuspid Valve: The tricuspid valve is normal in structure. Tricuspid valve regurgitation is moderate . No evidence of tricuspid stenosis. Aortic Valve: The aortic valve is calcified. There is mild calcification of the aortic valve. There is mild thickening of the aortic valve. Aortic valve regurgitation is not visualized. No aortic stenosis is present. Aortic valve mean gradient measures 6.0 mmHg. Aortic valve peak gradient measures 13.1 mmHg. Aortic valve area, by VTI measures 1.21 cm. Pulmonic Valve: The pulmonic valve was normal in structure. Pulmonic valve regurgitation is not visualized. No evidence of pulmonic stenosis. Aorta: The aortic root is normal in size and structure. Venous: The inferior vena cava is dilated  in size with greater than 50% respiratory variability, suggesting right atrial pressure of 8 mmHg. IAS/Shunts: No atrial level shunt detected by color flow Doppler.  LEFT VENTRICLE PLAX 2D LVIDd:         6.00 cm      Diastology LVIDs:         5.10 cm      LV e' medial:    4.24 cm/s LV PW:         0.85 cm      LV E/e' medial:  31.6 LV IVS:        1.15 cm      LV e' lateral:   7.18 cm/s LVOT diam:     1.70 cm      LV E/e' lateral: 18.7 LV SV:         48 LV SV Index:   26 LVOT Area:     2.27 cm  LV Volumes (MOD) LV vol d, MOD A2C: 152.0 ml LV vol d, MOD A4C: 143.0 ml LV vol s, MOD A2C: 82.1 ml LV vol s, MOD A4C: 92.2 ml LV SV MOD A2C:     69.9 ml LV SV MOD A4C:     143.0 ml LV SV MOD BP:      62.8 ml RIGHT VENTRICLE RV S prime:     6.31 cm/s TAPSE (M-mode): 1.1 cm LEFT ATRIUM           Index LA diam:      4.00 cm 2.19 cm/m LA Vol (A2C): 78.2 ml 42.76 ml/m LA Vol (A4C): 44.5 ml 24.33 ml/m  AORTIC VALVE AV Area (Vmax):    1.14 cm AV Area (Vmean):   1.13 cm AV Area (VTI):     1.21 cm AV Vmax:           181.00 cm/s AV Vmean:          111.000 cm/s AV VTI:            0.401 m AV Peak Grad:      13.1  mmHg AV Mean Grad:      6.0 mmHg LVOT Vmax:         90.80 cm/s LVOT Vmean:        55.200 cm/s LVOT VTI:          0.213 m LVOT/AV VTI ratio: 0.53  AORTA Ao Asc diam: 3.10 cm MITRAL VALVE                TRICUSPID VALVE MV Area (PHT): 5.27 cm     TV Peak grad:   42.0 mmHg MV Area VTI:   1.18 cm     TV Vmax:        3.24 m/s MV Peak grad:  9.0 mmHg     TR Peak grad:   42.0 mmHg MV Mean grad:  3.0 mmHg     TR Vmax:        324.00 cm/s MV Vmax:       1.50 m/s MV Vmean:      75.5 cm/s    SHUNTS MV Decel Time: 144 msec     Systemic VTI:  0.21 m MV E velocity: 134.00 cm/s  Systemic Diam: 1.70 cm MV A velocity: 89.40 cm/s MV E/A ratio:  1.50 Dorothye Gathers MD Electronically signed by Dorothye Gathers MD Signature Date/Time: 04/25/2024/3:19:00 PM    Final    VAS US  CAROTID Result Date: 04/25/2024 Carotid Arterial Duplex Study Patient Name:  CYNITHA OBERHOLTZER  Date of Exam:   04/25/2024 Medical Rec #: [id number]        Accession #:    [id number] Date of Birth: November 27, 1961         Patient Gender: F Patient Age:   52 years Exam Location:  [facility] Procedure:      VAS US  CAROTID Referring Phys: Margart Shears DE LA TORRE --------------------------------------------------------------------------------  Indications:       CVA. Risk Factors:      Hypertension, hyperlipidemia, prior MI, coronary artery                    disease, prior CVA, PAD. Comparison Study:  04/23/2024 CTA neck- 1. Negative for large vessel occlusion,                    But Positive for VERY SEVERE underlying atherosclerosis with:                    - LICA origin and bilateral ICA siphon RADIOGRAPHIC STRING                    SIGN STENOSES.                    - Right ICA distal bulb high-grade stenosis, estimated 70-80%                    and                    approaching a radiographic string sign. Performing Technologist: Delford Felling MHA, RDMS, RVT, RDCS  Examination Guidelines: A complete evaluation includes B-mode imaging, spectral Doppler, color Doppler, and  power Doppler as needed of all accessible portions of each vessel. Bilateral testing is considered an integral part of a complete examination. Limited examinations for reoccurring indications may be performed as noted.  Right Carotid Findings: +----------+--------+--------+--------+-----------------------+--------+           PSV cm/sEDV cm/sStenosisPlaque Description     Comments +----------+--------+--------+--------+-----------------------+--------+ CCA Prox  123     14                                              +----------+--------+--------+--------+-----------------------+--------+ CCA Distal79      8               heterogenous and smooth         +----------+--------+--------+--------+-----------------------+--------+ ICA Prox  170     36      1-39%   smooth and heterogenous         +----------+--------+--------+--------+-----------------------+--------+ ICA Mid   126     24                                              +----------+--------+--------+--------+-----------------------+--------+ ICA Distal70      12                                              +----------+--------+--------+--------+-----------------------+--------+ ECA       116                                                     +----------+--------+--------+--------+-----------------------+--------+ +----------+--------+-------+--------+--------------------+  PSV cm/sEDV cmsDescribeArm Pressure (mmHG)  +----------+--------+-------+--------+--------------------+ [id number]            StenoticRestricted extremity +----------+--------+-------+--------+--------------------+ +---------+--------+--+--------+-+---------+ VertebralPSV cm/s34EDV cm/s4Antegrade +---------+--------+--+--------+-+---------+  Left Carotid Findings: +----------+--------+--------+--------+------------------+--------+           PSV cm/sEDV cm/sStenosisPlaque DescriptionComments  +----------+--------+--------+--------+------------------+--------+ CCA Prox  77      9                                          +----------+--------+--------+--------+------------------+--------+ CCA Distal                                          stent    +----------+--------+--------+--------+------------------+--------+ ICA Prox                                            stent    +----------+--------+--------+--------+------------------+--------+ ICA Mid                                             stent    +----------+--------+--------+--------+------------------+--------+ ICA Distal104     24                                         +----------+--------+--------+--------+------------------+--------+ ECA       287     13                                         +----------+--------+--------+--------+------------------+--------+ +----------+--------+--------+----------------+-------------------+           PSV cm/sEDV cm/sDescribe        Arm Pressure (mmHG) +----------+--------+--------+----------------+-------------------+ [id number]              Multiphasic, [id number]                 +----------+--------+--------+----------------+-------------------+ +---------+--------+---+--------+--+---------+ VertebralPSV cm/s101EDV cm/s23Antegrade +---------+--------+---+--------+--+---------+  Left Stent(s): +---------------+--------+--------+--------+--------+--------+ ICA            PSV cm/sEDV cm/sStenosisWaveformComments +---------------+--------+--------+--------+--------+--------+ Prox to Stent  96      17                               +---------------+--------+--------+--------+--------+--------+ Proximal Stent 82      12                               +---------------+--------+--------+--------+--------+--------+ Mid Stent      227     27                               +---------------+--------+--------+--------+--------+--------+  Distal Stent   101     24                               +---------------+--------+--------+--------+--------+--------+  Distal to Stent104     24                               +---------------+--------+--------+--------+--------+--------+    Summary: Right Carotid: Velocities in the right ICA are consistent with an upper range                1-39% stenosis. Left Carotid: Left ICA stent is patent with elevated velocities suggestive of               50-74% stenosis. Vertebrals:  Bilateral vertebral arteries demonstrate antegrade flow. Subclavians: Right subclavian artery was stenotic. Normal flow hemodynamics were              seen in the left subclavian artery. *See table(s) above for measurements and observations.     Preliminary    EEG adult Result Date: 04/25/2024 Arleene Lack, MD     04/25/2024 10:18 AM Patient Name: LISANNE COBB MRN: [id number] Epilepsy Attending: Arleene Lack Referring Physician/Provider: Colon Dear, NP Date: 04/25/2024 Duration: 25.36 mins Patient history: 63 year old female with acute ischemic stroke getting EEG to evaluate for seizure. Level of alertness: Awake AEDs during EEG study: GBP, TPM Technical aspects: This EEG study was done with scalp electrodes positioned according to the 10-20 International system of electrode placement. Electrical activity was reviewed with band pass filter of 1-70Hz , sensitivity of 7 uV/mm, display speed of 61mm/sec with a 60Hz  notched filter applied as appropriate. EEG data were recorded continuously and digitally stored.  Video monitoring was available and reviewed as appropriate. Description: The posterior dominant rhythm consists of 8 Hz activity of moderate voltage (25-35 uV) seen predominantly in posterior head regions, symmetric and reactive to eye opening and eye closing. Hyperventilation and photic stimulation were not performed.   IMPRESSION: This study is within normal limits. No seizures or epileptiform discharges  were seen throughout the recording. A normal interictal EEG does not exclude the diagnosis of epilepsy. Priyanka O Yadav   Medications:  anticoagulant sodium citrate      amLODipine   10 mg Oral Daily   aspirin   81 mg Oral Daily   atorvastatin   80 mg Oral Daily   Chlorhexidine  Gluconate Cloth  6 each Topical Q0600   ezetimibe  10 mg Oral Daily   gabapentin   300 mg Oral QHS   heparin  injection (subcutaneous)  5,000 Units Subcutaneous Q8H   insulin  aspart  0-5 Units Subcutaneous QHS   insulin  aspart  0-6 Units Subcutaneous TID WC   mirtazapine   30 mg Oral QHS   ticagrelor  90 mg Oral BID   topiramate   50 mg Oral Daily   traZODone   50 mg Oral QHS    Assessment/Plan **acute CVA s/p thrombectomy and L ICA stenting:  per stroke team.   **ESRD on HD: usual schedule MWF, missed Mon due to CVA - Had HD Tues, having today and will resume usual MWF schedule sometime prior to d/c.  Sounds like she's going to CIR so anticipate HD Thurs, Sat then MWF thereafter.     **HTN: BP per stroke team.  Per notes goal BP 120-160 - will set parameters for HD.     **HFrEF:  volume management with HD. 3L goal today    **DM type 2: per primary   **Anemia: Hb 11s, monitor.   **Dyspnea: not really improving with albuterol  (has h/o asthma), will challenge EDW today.  CXR if not  improving   Will follow reach out with concerns.   Adrian Alba MD 04/26/2024, 9:51 AM  [facility] Pager: [phone]

## 2024-04-26 NOTE — Progress Notes (Signed)
 OT Cancellation Note  Patient Details Name: Terry Arroyo MRN: [id number] DOB: Jun 25, 1961   Cancelled Treatment:    Reason Eval/Treat Not Completed: Patient at procedure or test/ unavailable (Pt at HD, OT will follow-up with pt as able)  04/26/2024  AB, OTR/L  Acute Rehabilitation Services  Office: [phone]   [name] 04/26/2024, 11:33 AM

## 2024-04-26 NOTE — Progress Notes (Signed)
 Back from dialysis.  Ambulating with PT and OT now.  Said she was nauseated at first when she first got back to room but said she is much better now and denies the need for zofran  or anything at this time.

## 2024-04-26 NOTE — Progress Notes (Signed)
 Physical Therapy Treatment Patient Details Name: Terry Arroyo MRN: [id number] DOB: December 05, 1961 Today's Date: 04/26/2024   History of Present Illness Pt is a 63 y/o female presenting on 5/5 with R sided weakness. CT head negative, CTA with severe L internal carotid artery stenosis and 2 to 3 mm L MCA saccular aneurysm. 5/5 S/P emergent L carotid artery angioplasty and balloon placement. PMH includes: ESRD on HD, pituitary adenoma with visual field defect, CAD s/p CABG, DM2, chronic HFrEF.    PT Comments  Pt is progressing well towards goals. Currently mod I for supine to sitting, CGA for sit to stand and Min A for short non-functional distances of gait with RW. Pt has assist at home. Due to pt current functional status, home set up and available assistance at home recommending skilled physical therapy services > 3 hours/day in order to address strength, balance and functional mobility to decrease risk for falls, injury, immobility, skin break down and re-hospitalization.     If plan is discharge home, recommend the following: A little help with walking and/or transfers;Assistance with cooking/housework;Assist for transportation     Equipment Recommendations  None recommended by PT       Precautions / Restrictions Precautions Precautions: Fall Recall of Precautions/Restrictions: Intact Restrictions Weight Bearing Restrictions Per Provider Order: No     Mobility  Bed Mobility Overal bed mobility: Modified Independent Bed Mobility: Supine to Sit     Supine to sit: Modified independent (Device/Increase time)     General bed mobility comments: increased time HOB slightly elevated.    Transfers Overall transfer level: Needs assistance Equipment used: Rolling walker (2 wheels) Transfers: Sit to/from Stand Sit to Stand: Contact guard assist           General transfer comment: CGA for sit to stand for stability    Ambulation/Gait Ambulation/Gait assistance: Min assist Gait  Distance (Feet): 20 Feet Assistive device: Rolling walker (2 wheels) Gait Pattern/deviations: Step-through pattern, Decreased step length - left, Decreased step length - right, Narrow base of support Gait velocity: decreased Gait velocity interpretation: <1.31 ft/sec, indicative of household ambulator   General Gait Details: very short step length with flat foot initial contact and narrow BOS with MOderate to heavy UE support on RW for short non-functional distance with Min A for balance  Modified Rankin (Stroke Patients Only) Modified Rankin (Stroke Patients Only) Pre-Morbid Rankin Score: No significant disability Modified Rankin: Moderately severe disability     Balance Overall balance assessment: Needs assistance Sitting-balance support: Feet supported Sitting balance-Leahy Scale: Good     Standing balance support: Bilateral upper extremity supported, Reliant on assistive device for balance Standing balance-Leahy Scale: Fair           Cognition Arousal: Alert Behavior During Therapy: Flat affect   PT - Cognitive impairments: No family/caregiver present to determine baseline, No apparent impairments, Initiation       PT - Cognition Comments: slow to initiate. oriented to person place and time   Following commands impaired: Follows one step commands with increased time          General Comments General comments (skin integrity, edema, etc.): Pt just back from dialysis and was fatigued.      Pertinent Vitals/Pain Pain Assessment Pain Assessment: Faces Faces Pain Scale: Hurts little more Facial Expression: Relaxed, neutral Body Movements: Absence of movements Muscle Tension: Tense, rigid Compliance with ventilator (intubated pts.): N/A Vocalization (extubated pts.): Sighing, moaning CPOT Total: 2 Pain Location: lower back Pain Descriptors / Indicators: Discomfort, Tightness  Pain Intervention(s): Monitored during session, Limited activity within patient's  tolerance     PT Goals (current goals can now be found in the care plan section) Acute Rehab PT Goals Patient Stated Goal: did not state PT Goal Formulation: With patient Time For Goal Achievement: 05/08/24 Potential to Achieve Goals: Good Progress towards PT goals: Progressing toward goals    Frequency    Min 3X/week      PT Plan  Continue with current POC        AM-PAC PT "6 Clicks" Mobility   Outcome Measure  Help needed turning from your back to your side while in a flat bed without using bedrails?: None Help needed moving from lying on your back to sitting on the side of a flat bed without using bedrails?: None Help needed moving to and from a bed to a chair (including a wheelchair)?: A Little Help needed standing up from a chair using your arms (e.g., wheelchair or bedside chair)?: A Little Help needed to walk in hospital room?: A Little Help needed climbing 3-5 steps with a railing? : A Little 6 Click Score: 20    End of Session Equipment Utilized During Treatment: Gait belt Activity Tolerance: Patient tolerated treatment well;Patient limited by fatigue Patient left: Other (comment) (in room with occupational therapy) Nurse Communication: Mobility status PT Visit Diagnosis: Unsteadiness on feet (R26.81);Difficulty in walking, not elsewhere classified (R26.2);Hemiplegia and hemiparesis Hemiplegia - Right/Left: Right Hemiplegia - dominant/non-dominant: Non-dominant     Time: 1610-9604 PT Time Calculation (min) (ACUTE ONLY): 11 min  Charges:    $Therapeutic Activity: 8-22 mins PT General Charges $$ ACUTE PT VISIT: 1 Visit                     Sloan Duncans, DPT, CLT  Acute Rehabilitation Services Office: [phone] (Secure chat preferred)    Jenice Mitts 04/26/2024, 3:08 PM

## 2024-04-26 NOTE — Progress Notes (Signed)
 Speech Language Pathology Treatment: Cognitive-Linquistic  Patient Details Name: Terry Arroyo MRN: [id number] DOB: [birth year] Today's Date: 04/26/2024 Time: 4098-1191 SLP Time Calculation (min) (ACUTE ONLY): 14 min  Assessment / Plan / Recommendation Clinical Impression  Pt remains very flat, with few displays of expression despite frequent laughter. She continues to make frequent errors in spontaneous speech consisting of phonemic paraphasias and repetition. Structured sentence repetition has improved and attempts are now 100% intelligible. She had difficulty with delayed recall, but accuracy improved when given semantic cues. Pt demonstrates improved awareness of errors and increased attempts at self-correction. SLP will continue following.    HPI HPI: Terry Arroyo 63 year old female who was brought to the emergency department at [facility] with right arm weakness. Not a candidate for TNK and CT negative, but CTA showing severe left internal carotid artery stenosis and 2 to 3 mm left MCA saccular aneurysm.  Patient was transferred to [facility], underwent emergent left carotid artery angioplasty and balloon placement. Requried short period of ventilatory support post procedure. Hypophonia noted prior to intubation.  MRI 5/6 with no acute findings.  Pt with end-stage renal disease on hemodialysis, prior history of pituitary adenoma complicated with visual field defect, coronary artery disease status post CABG, diabetes type 2 and chronic HFrEF.      SLP Plan  Continue with current plan of care      Recommendations for follow up therapy are one component of a multi-disciplinary discharge planning process, led by the attending physician.  Recommendations may be updated based on patient status, additional functional criteria and insurance authorization.    Recommendations                     Oral care BID   Frequent or constant Supervision/Assistance Dysarthria and  anarthria (R47.1);Aphasia (R47.01)     Continue with current plan of care     Amil Kale, M.A., CCC-SLP Speech Language Pathology, Acute Rehabilitation Services  Secure Chat preferred [phone]   04/26/2024, 5:05 PM

## 2024-04-26 NOTE — Plan of Care (Signed)
progressive

## 2024-04-27 DIAGNOSIS — I5022 Chronic systolic (congestive) heart failure: Secondary | ICD-10-CM

## 2024-04-27 DIAGNOSIS — I639 Cerebral infarction, unspecified: Secondary | ICD-10-CM | POA: Diagnosis not present

## 2024-04-27 DIAGNOSIS — I6523 Occlusion and stenosis of bilateral carotid arteries: Secondary | ICD-10-CM | POA: Diagnosis not present

## 2024-04-27 DIAGNOSIS — E1122 Type 2 diabetes mellitus with diabetic chronic kidney disease: Secondary | ICD-10-CM | POA: Diagnosis not present

## 2024-04-27 DIAGNOSIS — R29704 NIHSS score 4: Secondary | ICD-10-CM | POA: Diagnosis not present

## 2024-04-27 LAB — GLUCOSE, CAPILLARY
Glucose-Capillary: 127 mg/dL — ABNORMAL HIGH (ref 70–99)
Glucose-Capillary: 157 mg/dL — ABNORMAL HIGH (ref 70–99)
Glucose-Capillary: 301 mg/dL — ABNORMAL HIGH (ref 70–99)

## 2024-04-27 LAB — CBC
HCT: 33.2 % — ABNORMAL LOW (ref 36.0–46.0)
Hemoglobin: 10.8 g/dL — ABNORMAL LOW (ref 12.0–15.0)
MCH: 29.6 pg (ref 26.0–34.0)
MCHC: 32.5 g/dL (ref 30.0–36.0)
MCV: 91 fL (ref 80.0–100.0)
Platelets: 240 10*3/uL (ref 150–400)
RBC: 3.65 MIL/uL — ABNORMAL LOW (ref 3.87–5.11)
RDW: 14.4 % (ref 11.5–15.5)
WBC: 7.8 10*3/uL (ref 4.0–10.5)
nRBC: 0 % (ref 0.0–0.2)

## 2024-04-27 LAB — RENAL FUNCTION PANEL
Albumin: 3.4 g/dL — ABNORMAL LOW (ref 3.5–5.0)
Anion gap: 15 (ref 5–15)
BUN: 36 mg/dL — ABNORMAL HIGH (ref 8–23)
CO2: 25 mmol/L (ref 22–32)
Calcium: 8.9 mg/dL (ref 8.9–10.3)
Chloride: 95 mmol/L — ABNORMAL LOW (ref 98–111)
Creatinine, Ser: 5.83 mg/dL — ABNORMAL HIGH (ref 0.44–1.00)
GFR, Estimated: 8 mL/min — ABNORMAL LOW (ref >60)
Glucose, Bld: 139 mg/dL — ABNORMAL HIGH (ref 70–99)
Phosphorus: 3.5 mg/dL (ref 2.5–4.6)
Potassium: 4.2 mmol/L (ref 3.5–5.1)
Sodium: 135 mmol/L (ref 135–145)

## 2024-04-27 NOTE — Progress Notes (Signed)
 [facility] Progress Note   Subjective:   UF 3L at HD yesterday; dyspnea and orthopnea much improved.  Neurologic deficits improving.   Objective Vitals:   04/26/24 2002 04/26/24 2346 04/27/24 0404 04/27/24 0700  BP: (!) 138/54 (!) 154/76 (!) 142/62 (!) 154/76  Pulse: 71 66 67 71  Resp: 18 16 18 18   Temp: 98.4 F (36.9 C) 97.9 F (36.6 C) 98.4 F (36.9 C) 98.2 F (36.8 C)  TempSrc: Oral Oral Oral Oral  SpO2: 91% 94% 95% 95%  Weight:       Physical Exam Gen: comfortable Eyes: EOMI ENT:MMM Neck: supple CV:  RRR Abd: soft Lungs: clear ant on RA Extr: RUE AVF +t/b, no LE edema Neuro:awake, conversant, dysarthria but able to communicate, 5-/5 strength R upper and lower, L ok Skin: no rashes   Additional Objective Labs: Basic Metabolic Panel: Recent Labs  Lab 04/24/24 0616 04/25/24 0529 04/26/24 0959  NA 134* 133* 132*  K 4.8 3.9 4.4  CL 100 97* 95*  CO2 18* 22 22  GLUCOSE 73 143* 176*  BUN 75* 43* 62*  CREATININE 10.29* 7.09* 9.03*  CALCIUM  8.1* 8.6* 8.7*  PHOS  --   --  5.8*   Liver Function Tests: Recent Labs  Lab 04/23/24 1107 04/24/24 0616 04/26/24 0959  AST 12* 11*  --   ALT 8 9  --   ALKPHOS 83 74  --   BILITOT 1.5* 0.9  --   PROT 8.2* 7.2  --   ALBUMIN  4.1 3.4* 3.4*   No results for input(s): "LIPASE", "AMYLASE" in the last 168 hours. CBC: Recent Labs  Lab 04/23/24 1107 04/23/24 1230 04/24/24 0616 04/25/24 0529 04/26/24 0959  WBC 8.9 8.7 9.6 8.3 8.2  NEUTROABS  --  6.3  --   --   --   HGB 11.9* 11.9* 11.2* 10.5* 10.3*  HCT 37.3 37.6 34.1* 32.1* 31.3*  MCV 92.6 93.1 90.7 88.9 89.7  PLT 196 201 186 172 190   Blood Culture    Component Value Date/Time   SDES BLOOD LEFT WRIST 02/10/2022 2205   SPECREQUEST  02/10/2022 2205    BOTTLES DRAWN AEROBIC AND ANAEROBIC Blood Culture adequate volume   CULT  02/10/2022 2205    NO GROWTH 5 DAYS Performed at [facility], [address] [place], [place] [address]     REPTSTATUS 02/15/2022 FINAL 02/10/2022 2205    Cardiac Enzymes: No results for input(s): "CKTOTAL", "CKMB", "CKMBINDEX", "TROPONINI" in the last 168 hours. CBG: Recent Labs  Lab 04/25/24 2110 04/26/24 0620 04/26/24 1505 04/26/24 2122 04/27/24 0613  GLUCAP 174* 206* 91 168* 127*   Iron Studies: No results for input(s): "IRON", "TIBC", "TRANSFERRIN", "FERRITIN" in the last 72 hours. @lablastinr3 @ Studies/Results: ECHOCARDIOGRAM COMPLETE Result Date: 04/25/2024    ECHOCARDIOGRAM REPORT   Patient Name:   Terry Arroyo Date of Exam: 04/25/2024 Medical Rec #:  [id number]       Height:       66.0 in Accession #:    [id number]      Weight:       162.0 lb Date of Birth:  10/09/1961        BSA:          1.829 m Patient Age:    63 years        BP:           154/61 mmHg Patient Gender: F  HR:           63 bpm. Exam Location:  Inpatient Procedure: 2D Echo, Color Doppler and Cardiac Doppler (Both Spectral and Color            Flow Doppler were utilized during procedure). Indications:    Stroke  History:        Patient has prior history of Echocardiogram examinations, most                 recent 12/08/2023.  Sonographer:    Andrena Bang Referring Phys: Leandro Proffer DE LA TORRE IMPRESSIONS  1. Left ventricular ejection fraction, by estimation, is 35 to 40%. The left ventricle has moderately decreased function. The left ventricle demonstrates regional wall motion abnormalities (see scoring diagram/findings for description). Left ventricular  diastolic parameters are consistent with Grade II diastolic dysfunction (pseudonormalization).  2. Right ventricular systolic function is mildly reduced. The right ventricular size is moderately enlarged. There is moderately elevated pulmonary artery systolic pressure. The estimated right ventricular systolic pressure is 50.0 mmHg.  3. Left atrial size was moderately dilated.  4. Right atrial size was moderately dilated.  5. The mitral valve is normal in structure. Mild  to moderate mitral valve regurgitation. No evidence of mitral stenosis. Moderate mitral annular calcification.  6. Tricuspid valve regurgitation is moderate.  7. The aortic valve is calcified. There is mild calcification of the aortic valve. There is mild thickening of the aortic valve. Aortic valve regurgitation is not visualized. No aortic stenosis is present. Aortic valve mean gradient measures 6.0 mmHg. Aortic valve Vmax measures 1.81 m/s.  8. The inferior vena cava is dilated in size with >50% respiratory variability, suggesting right atrial pressure of 8 mmHg. Conclusion(s)/Recommendation(s): No intracardiac source of embolism detected on this transthoracic study. Consider a transesophageal echocardiogram to exclude cardiac source of embolism if clinically indicated. FINDINGS  Left Ventricle: Left ventricular ejection fraction, by estimation, is 35 to 40%. The left ventricle has moderately decreased function. The left ventricle demonstrates regional wall motion abnormalities. The left ventricular internal cavity size was normal in size. There is no left ventricular hypertrophy. Left ventricular diastolic parameters are consistent with Grade II diastolic dysfunction (pseudonormalization).  LV Wall Scoring: The inferior wall and posterior wall are akinetic. Right Ventricle: The right ventricular size is moderately enlarged. No increase in right ventricular wall thickness. Right ventricular systolic function is mildly reduced. There is moderately elevated pulmonary artery systolic pressure. The tricuspid regurgitant velocity is 3.24 m/s, and with an assumed right atrial pressure of 8 mmHg, the estimated right ventricular systolic pressure is 50.0 mmHg. Left Atrium: Left atrial size was moderately dilated. Right Atrium: Right atrial size was moderately dilated. Pericardium: There is no evidence of pericardial effusion. Mitral Valve: The mitral valve is normal in structure. Moderate mitral annular calcification.  Mild to moderate mitral valve regurgitation. No evidence of mitral valve stenosis. MV peak gradient, 9.0 mmHg. The mean mitral valve gradient is 3.0 mmHg. Tricuspid Valve: The tricuspid valve is normal in structure. Tricuspid valve regurgitation is moderate . No evidence of tricuspid stenosis. Aortic Valve: The aortic valve is calcified. There is mild calcification of the aortic valve. There is mild thickening of the aortic valve. Aortic valve regurgitation is not visualized. No aortic stenosis is present. Aortic valve mean gradient measures 6.0 mmHg. Aortic valve peak gradient measures 13.1 mmHg. Aortic valve area, by VTI measures 1.21 cm. Pulmonic Valve: The pulmonic valve was normal in structure. Pulmonic valve regurgitation is not visualized. No  evidence of pulmonic stenosis. Aorta: The aortic root is normal in size and structure. Venous: The inferior vena cava is dilated in size with greater than 50% respiratory variability, suggesting right atrial pressure of 8 mmHg. IAS/Shunts: No atrial level shunt detected by color flow Doppler.  LEFT VENTRICLE PLAX 2D LVIDd:         6.00 cm      Diastology LVIDs:         5.10 cm      LV e' medial:    4.24 cm/s LV PW:         0.85 cm      LV E/e' medial:  31.6 LV IVS:        1.15 cm      LV e' lateral:   7.18 cm/s LVOT diam:     1.70 cm      LV E/e' lateral: 18.7 LV SV:         48 LV SV Index:   26 LVOT Area:     2.27 cm  LV Volumes (MOD) LV vol d, MOD A2C: 152.0 ml LV vol d, MOD A4C: 143.0 ml LV vol s, MOD A2C: 82.1 ml LV vol s, MOD A4C: 92.2 ml LV SV MOD A2C:     69.9 ml LV SV MOD A4C:     143.0 ml LV SV MOD BP:      62.8 ml RIGHT VENTRICLE RV S prime:     6.31 cm/s TAPSE (M-mode): 1.1 cm LEFT ATRIUM           Index LA diam:      4.00 cm 2.19 cm/m LA Vol (A2C): 78.2 ml 42.76 ml/m LA Vol (A4C): 44.5 ml 24.33 ml/m  AORTIC VALVE AV Area (Vmax):    1.14 cm AV Area (Vmean):   1.13 cm AV Area (VTI):     1.21 cm AV Vmax:           181.00 cm/s AV Vmean:          111.000  cm/s AV VTI:            0.401 m AV Peak Grad:      13.1 mmHg AV Mean Grad:      6.0 mmHg LVOT Vmax:         90.80 cm/s LVOT Vmean:        55.200 cm/s LVOT VTI:          0.213 m LVOT/AV VTI ratio: 0.53  AORTA Ao Asc diam: 3.10 cm MITRAL VALVE                TRICUSPID VALVE MV Area (PHT): 5.27 cm     TV Peak grad:   42.0 mmHg MV Area VTI:   1.18 cm     TV Vmax:        3.24 m/s MV Peak grad:  9.0 mmHg     TR Peak grad:   42.0 mmHg MV Mean grad:  3.0 mmHg     TR Vmax:        324.00 cm/s MV Vmax:       1.50 m/s MV Vmean:      75.5 cm/s    SHUNTS MV Decel Time: 144 msec     Systemic VTI:  0.21 m MV E velocity: 134.00 cm/s  Systemic Diam: 1.70 cm MV A velocity: 89.40 cm/s MV E/A ratio:  1.50 Dorothye Gathers MD Electronically signed by Dorothye Gathers MD Signature Date/Time: 04/25/2024/3:19:00 PM  Final    VAS US  CAROTID Result Date: 04/25/2024 Carotid Arterial Duplex Study Patient Name:  Terry Arroyo  Date of Exam:   04/25/2024 Medical Rec #: [id number]        Accession #:    [id number] Date of Birth: 1961/05/08         Patient Gender: F Patient Age:   51 years Exam Location:  [facility] Procedure:      VAS US  CAROTID Referring Phys: Margart Shears DE LA TORRE --------------------------------------------------------------------------------  Indications:       CVA. Risk Factors:      Hypertension, hyperlipidemia, prior MI, coronary artery                    disease, prior CVA, PAD. Comparison Study:  04/23/2024 CTA neck- 1. Negative for large vessel occlusion,                    But Positive for VERY SEVERE underlying atherosclerosis with:                    - LICA origin and bilateral ICA siphon RADIOGRAPHIC STRING                    SIGN STENOSES.                    - Right ICA distal bulb high-grade stenosis, estimated 70-80%                    and                    approaching a radiographic string sign. Performing Technologist: Delford Felling MHA, RDMS, RVT, RDCS  Examination Guidelines: A complete evaluation includes  B-mode imaging, spectral Doppler, color Doppler, and power Doppler as needed of all accessible portions of each vessel. Bilateral testing is considered an integral part of a complete examination. Limited examinations for reoccurring indications may be performed as noted.  Right Carotid Findings: +----------+--------+--------+--------+-----------------------+--------+           PSV cm/sEDV cm/sStenosisPlaque Description     Comments +----------+--------+--------+--------+-----------------------+--------+ CCA Prox  123     14                                              +----------+--------+--------+--------+-----------------------+--------+ CCA Distal79      8               heterogenous and smooth         +----------+--------+--------+--------+-----------------------+--------+ ICA Prox  170     36      1-39%   smooth and heterogenous         +----------+--------+--------+--------+-----------------------+--------+ ICA Mid   126     24                                              +----------+--------+--------+--------+-----------------------+--------+ ICA Distal70      12                                              +----------+--------+--------+--------+-----------------------+--------+  ECA       116                                                     +----------+--------+--------+--------+-----------------------+--------+ +----------+--------+-------+--------+--------------------+           PSV cm/sEDV cmsDescribeArm Pressure (mmHG)  +----------+--------+-------+--------+--------------------+ [id number]            StenoticRestricted extremity +----------+--------+-------+--------+--------------------+ +---------+--------+--+--------+-+---------+ VertebralPSV cm/s34EDV cm/s4Antegrade +---------+--------+--+--------+-+---------+  Left Carotid Findings: +----------+--------+--------+--------+------------------+--------+           PSV cm/sEDV  cm/sStenosisPlaque DescriptionComments +----------+--------+--------+--------+------------------+--------+ CCA Prox  77      9                                          +----------+--------+--------+--------+------------------+--------+ CCA Distal                                          stent    +----------+--------+--------+--------+------------------+--------+ ICA Prox                                            stent    +----------+--------+--------+--------+------------------+--------+ ICA Mid                                             stent    +----------+--------+--------+--------+------------------+--------+ ICA Distal104     24                                         +----------+--------+--------+--------+------------------+--------+ ECA       287     13                                         +----------+--------+--------+--------+------------------+--------+ +----------+--------+--------+----------------+-------------------+           PSV cm/sEDV cm/sDescribe        Arm Pressure (mmHG) +----------+--------+--------+----------------+-------------------+ [id number]              Multiphasic, [id number]                 +----------+--------+--------+----------------+-------------------+ +---------+--------+---+--------+--+---------+ VertebralPSV cm/s101EDV cm/s23Antegrade +---------+--------+---+--------+--+---------+  Left Stent(s): +---------------+--------+--------+--------+--------+--------+ ICA            PSV cm/sEDV cm/sStenosisWaveformComments +---------------+--------+--------+--------+--------+--------+ Prox to Stent  96      17                               +---------------+--------+--------+--------+--------+--------+ Proximal Stent 82      12                               +---------------+--------+--------+--------+--------+--------+ Mid Stent      227  27                                +---------------+--------+--------+--------+--------+--------+ Distal Stent   101     24                               +---------------+--------+--------+--------+--------+--------+ Distal to Stent104     24                               +---------------+--------+--------+--------+--------+--------+    Summary: Right Carotid: Velocities in the right ICA are consistent with an upper range                1-39% stenosis. Left Carotid: Left ICA stent is patent with elevated velocities suggestive of               50-74% stenosis. Vertebrals:  Bilateral vertebral arteries demonstrate antegrade flow. Subclavians: Right subclavian artery was stenotic. Normal flow hemodynamics were              seen in the left subclavian artery. *See table(s) above for measurements and observations.     Preliminary    EEG adult Result Date: 04/25/2024 Arleene Lack, MD     04/25/2024 10:18 AM Patient Name: Terry Arroyo MRN: [id number] Epilepsy Attending: Arleene Lack Referring Physician/Provider: Colon Dear, NP Date: 04/25/2024 Duration: 25.36 mins Patient history: 63 year old female with acute ischemic stroke getting EEG to evaluate for seizure. Level of alertness: Awake AEDs during EEG study: GBP, TPM Technical aspects: This EEG study was done with scalp electrodes positioned according to the 10-20 International system of electrode placement. Electrical activity was reviewed with band pass filter of 1-70Hz , sensitivity of 7 uV/mm, display speed of 94mm/sec with a 60Hz  notched filter applied as appropriate. EEG data were recorded continuously and digitally stored.  Video monitoring was available and reviewed as appropriate. Description: The posterior dominant rhythm consists of 8 Hz activity of moderate voltage (25-35 uV) seen predominantly in posterior head regions, symmetric and reactive to eye opening and eye closing. Hyperventilation and photic stimulation were not performed.   IMPRESSION: This study  is within normal limits. No seizures or epileptiform discharges were seen throughout the recording. A normal interictal EEG does not exclude the diagnosis of epilepsy. Priyanka O Yadav   Medications:    amLODipine   10 mg Oral Daily   aspirin   81 mg Oral Daily   atorvastatin   80 mg Oral Daily   Chlorhexidine  Gluconate Cloth  6 each Topical Q0600   ezetimibe   10 mg Oral Daily   gabapentin   300 mg Oral QHS   heparin  injection (subcutaneous)  5,000 Units Subcutaneous Q8H   insulin  aspart  0-5 Units Subcutaneous QHS   insulin  aspart  0-6 Units Subcutaneous TID WC   mirtazapine   30 mg Oral QHS   ticagrelor   90 mg Oral BID   topiramate   50 mg Oral Daily   traZODone   50 mg Oral QHS    Assessment/Plan **acute CVA s/p thrombectomy and L ICA stenting:  per stroke team.   **ESRD on HD: usual schedule MWF, missed Mon due to CVA - Had HD Tues, Thurs so far this week.  If she is for d/c today please let me know she'll need HD prior to d/c, but I don't expect  D/C so planning for HD Sat then resume usual outpt schedule MWF on Mon. D/w RN to notify me if she's d/cing.    **HTN: BP per stroke team.  Per notes goal BP 120-160 - will set parameters for HD.     **HFrEF:  volume management with HD. 3L on 04/26/24.  Standing post weights as able.  Fluid restriction.    **DM type 2: per primary   **Anemia: Hb 10-11s, monitor.   **Dyspnea: improved with UF.  Fluid restriction.  **BMM: not on outpt binders, renal diet.   Phos decent. CTM.    Will follow reach out with concerns.   Adrian Alba MD 04/27/2024, 9:51 AM  [facility] Pager: [phone]

## 2024-04-27 NOTE — Progress Notes (Signed)
   Rounding Note    Patient Name: Terry Arroyo Date of Encounter: 04/27/2024    We were asked to see pt for  "new" CHF . In reviewing her chart,   Current echo shows EF 35-40% with Inferior wall akinesis  Her echo from June 13, 2021 shows a similar LVEF and similar wall motion abnormality   There has not been any significant change.  She has ESRD and is on dialysis  This can be addressed in the outpatient setting   Clinically , she is stable   She lives in [place] and has seen [place] [facility] in the past .  Consider OP referral to [facility] or our [place] partners at [facility] or in [place] .   No indication for inpatient consultation at this time     Ahmad Alert, MD  04/27/2024 1:38 PM    [facility] [address] [place],  [address] [place], [place]  [address] Phone: [phone]; Fax: [phone]         Signed, Ahmad Alert, MD  04/27/2024, 1:33 PM

## 2024-04-27 NOTE — Plan of Care (Signed)
 Problem: Education: Goal: Knowledge of disease or condition will improve Outcome: Progressing Goal: Knowledge of secondary prevention will improve (MUST DOCUMENT ALL) Outcome: Progressing Goal: Knowledge of patient specific risk factors will improve (DELETE if not current risk factor) Outcome: Progressing   Problem: Ischemic Stroke/TIA Tissue Perfusion: Goal: Complications of ischemic stroke/TIA will be minimized Outcome: Progressing   Problem: Coping: Goal: Will verbalize positive feelings about self Outcome: Progressing Goal: Will identify appropriate support needs Outcome: Progressing   Problem: Health Behavior/Discharge Planning: Goal: Ability to manage health-related needs will improve Outcome: Progressing Goal: Goals will be collaboratively established with patient/family Outcome: Progressing   Problem: Self-Care: Goal: Ability to participate in self-care as condition permits will improve Outcome: Progressing Goal: Verbalization of feelings and concerns over difficulty with self-care will improve Outcome: Progressing Goal: Ability to communicate needs accurately will improve Outcome: Progressing   Problem: Nutrition: Goal: Risk of aspiration will decrease Outcome: Progressing Goal: Dietary intake will improve Outcome: Progressing   Problem: Education: Goal: Knowledge of General Education information will improve Description: Including pain rating scale, medication(s)/side effects and non-pharmacologic comfort measures Outcome: Progressing   Problem: Health Behavior/Discharge Planning: Goal: Ability to manage health-related needs will improve Outcome: Progressing   Problem: Clinical Measurements: Goal: Ability to maintain clinical measurements within normal limits will improve Outcome: Progressing Goal: Will remain free from infection Outcome: Progressing Goal: Diagnostic test results will improve Outcome: Progressing Goal: Respiratory complications will  improve Outcome: Progressing Goal: Cardiovascular complication will be avoided Outcome: Progressing   Problem: Activity: Goal: Risk for activity intolerance will decrease Outcome: Progressing   Problem: Nutrition: Goal: Adequate nutrition will be maintained Outcome: Progressing   Problem: Coping: Goal: Level of anxiety will decrease Outcome: Progressing   Problem: Elimination: Goal: Will not experience complications related to bowel motility Outcome: Progressing Goal: Will not experience complications related to urinary retention Outcome: Progressing   Problem: Pain Managment: Goal: General experience of comfort will improve and/or be controlled Outcome: Progressing   Problem: Safety: Goal: Ability to remain free from injury will improve Outcome: Progressing   Problem: Skin Integrity: Goal: Risk for impaired skin integrity will decrease Outcome: Progressing   Problem: Education: Goal: Understanding of CV disease, CV risk reduction, and recovery process will improve Outcome: Progressing Goal: Individualized Educational Video(s) Outcome: Progressing   Problem: Activity: Goal: Ability to return to baseline activity level will improve Outcome: Progressing   Problem: Cardiovascular: Goal: Ability to achieve and maintain adequate cardiovascular perfusion will improve Outcome: Progressing Goal: Vascular access site(s) Level 0-1 will be maintained Outcome: Progressing   Problem: Health Behavior/Discharge Planning: Goal: Ability to safely manage health-related needs after discharge will improve Outcome: Progressing   Problem: Education: Goal: Ability to describe self-care measures that may prevent or decrease complications (Diabetes Survival Skills Education) will improve Outcome: Progressing Goal: Individualized Educational Video(s) Outcome: Progressing   Problem: Coping: Goal: Ability to adjust to condition or change in health will improve Outcome:  Progressing   Problem: Fluid Volume: Goal: Ability to maintain a balanced intake and output will improve Outcome: Progressing   Problem: Health Behavior/Discharge Planning: Goal: Ability to identify and utilize available resources and services will improve Outcome: Progressing Goal: Ability to manage health-related needs will improve Outcome: Progressing   Problem: Metabolic: Goal: Ability to maintain appropriate glucose levels will improve Outcome: Progressing   Problem: Nutritional: Goal: Maintenance of adequate nutrition will improve Outcome: Progressing Goal: Progress toward achieving an optimal weight will improve Outcome: Progressing   Problem: Skin Integrity: Goal: Risk for impaired skin  integrity will decrease Outcome: Progressing   Problem: Tissue Perfusion: Goal: Adequacy of tissue perfusion will improve Outcome: Progressing

## 2024-04-27 NOTE — Anesthesia Preprocedure Evaluation (Signed)
 Anesthesia Evaluation  Patient identified by MRN, date of birth, ID band  Reviewed: Unable to perform ROS - Chart review onlyPreop documentation limited or incomplete due to emergent nature of procedure.  Airway Mallampati: Unable to assess       Dental   Pulmonary shortness of breath, asthma , sleep apnea , COPD, former smoker   breath sounds clear to auscultation       Cardiovascular hypertension, + CAD, + Past MI, + Peripheral Vascular Disease, +CHF and + DOE   Rhythm:Regular     Neuro/Psych  Headaches PSYCHIATRIC DISORDERS Anxiety Depression     Neuromuscular disease CVA    GI/Hepatic ,GERD  ,,  Endo/Other  diabetes    Renal/GU ESRF and DialysisRenal disease     Musculoskeletal   Abdominal   Peds  Hematology  (+) Blood dyscrasia, anemia   Anesthesia Other Findings   Reproductive/Obstetrics                              Anesthesia Physical Anesthesia Plan  ASA: 4 and emergent  Anesthesia Plan: General   Post-op Pain Management:    Induction: Intravenous, Rapid sequence and Cricoid pressure planned  PONV Risk Score and Plan: 3 and Treatment may vary due to age or medical condition  Airway Management Planned: Oral ETT  Additional Equipment: Arterial line  Intra-op Plan:   Post-operative Plan: Possible Post-op intubation/ventilation  Informed Consent:      History available from chart only and Only emergency history available  Plan Discussed with: CRNA and Surgeon  Anesthesia Plan Comments:          Anesthesia Quick Evaluation

## 2024-04-27 NOTE — Progress Notes (Addendum)
 STROKE TEAM PROGRESS NOTE    SIGNIFICANT HOSPITAL EVENTS 5/5: Patient admitted with aphasia and right-sided weakness, found to have severe left ICA stenosis with string sign and taken for emergent left ICA stenting  INTERIM HISTORY/SUBJECTIVE Patient is seen ambulating in her room.  She has remained hemodynamically stable and afebrile.  Will consult cardiology given low ejection fraction.  She is awaiting placement at SNF for rehab. OBJECTIVE  CBC    Component Value Date/Time   WBC 8.2 04/26/2024 0959   RBC 3.49 (L) 04/26/2024 0959   HGB 10.3 (L) 04/26/2024 0959   HGB 12.6 11/18/2013 0138   HCT 31.3 (L) 04/26/2024 0959   HCT 37.9 11/18/2013 0138   PLT 190 04/26/2024 0959   PLT 444 (H) 11/18/2013 0138   MCV 89.7 04/26/2024 0959   MCV 83 11/18/2013 0138   MCH 29.5 04/26/2024 0959   MCHC 32.9 04/26/2024 0959   RDW 14.5 04/26/2024 0959   RDW 14.1 11/18/2013 0138   LYMPHSABS 1.3 04/23/2024 1230   MONOABS 0.6 04/23/2024 1230   EOSABS 0.4 04/23/2024 1230   BASOSABS 0.1 04/23/2024 1230    BMET    Component Value Date/Time   NA 132 (L) 04/26/2024 0959   NA 134 (L) 11/18/2013 0138   K 4.4 04/26/2024 0959   K 3.8 11/18/2013 0138   CL 95 (L) 04/26/2024 0959   CL 98 11/18/2013 0138   CO2 22 04/26/2024 0959   CO2 29 11/18/2013 0138   GLUCOSE 176 (H) 04/26/2024 0959   GLUCOSE 348 (H) 11/18/2013 0138   BUN 62 (H) 04/26/2024 0959   BUN 11 11/18/2013 0138   CREATININE 9.03 (H) 04/26/2024 0959   CREATININE 0.87 11/18/2013 0138   CALCIUM  8.7 (L) 04/26/2024 0959   CALCIUM  9.4 11/18/2013 0138   GFRNONAA 5 (L) 04/26/2024 0959   GFRNONAA >60 11/18/2013 0138    IMAGING past 24 hours No results found.    Vitals:   04/26/24 2002 04/26/24 2346 04/27/24 0404 04/27/24 0700  BP: (!) 138/54 (!) 154/76 (!) 142/62 (!) 154/76  Pulse: 71 66 67 71  Resp: 18 16 18 18   Temp: 98.4 F (36.9 C) 97.9 F (36.6 C) 98.4 F (36.9 C) 98.2 F (36.8 C)  TempSrc: Oral Oral Oral Oral  SpO2: 91%  94% 95% 95%  Weight:         PHYSICAL EXAM General:  Alert, well-nourished, well-developed patient in no acute distress Psych:  Mood and affect appropriate for situation CV: Regular rate and rhythm on monitor Respiratory:  Regular, unlabored respirations on room air GI: Abdomen soft and nontender   NEURO:  Mental Status: AA&Ox3, patient is able to converse fluently Speech/Language: speech is without dysarthria or aphasia.    Cranial Nerves:  II: PERRL. III, IV, VI: EOMI. Eyelids elevate symmetrically.  VII: Subtle right facial droop VIII: hearing intact to voice. IX, X: Phonation is normal.  Motor: Able to move all 4 extremities with good antigravity strength but some weakness and drift to the right arm and right leg.   Tone: is normal and bulk is normal Sensation- Intact to light touch bilaterally but slightly diminished on the right Gait-ambulates in room with steady gait  Most Recent NIH  1a Level of Conscious.: 0 1b LOC Questions: 0 1c LOC Commands: 0 2 Best Gaze: 0 3 Visual: 1 4 Facial Palsy: 1 5a Motor Arm - left: 0 5b Motor Arm - Right: 1 6a Motor Leg - Left: 0 6b Motor Leg - Right: 0  7 Limb Ataxia: 0 8 Sensory: 1 9 Best Language: 0 10 Dysarthria: 0 11 Extinct. and Inatten.: 0 TOTAL: 4   ASSESSMENT/PLAN  Terry Arroyo is a 63 y.o. female with history of end-stage renal disease on dialysis, cerebral aneurysm, diabetes, hypertension, hyperlipidemia, DVT, CAD status post CABG, CHF with EF 35 to 40% and pituitary adenoma status postresection admitted for acute onset right-sided weakness and aphasia.  Patient initially presented to her dialysis center and complained of back pain and right arm weakness as well as headache but was then found to be aphasic with dense hemiparesis on the right.  She was outside of the window for TNK but was found to have severe left ICA stenosis and was taken to interventional radiology for left ICA stenting.  NIH on Admission  15  Acute ischemic stroke aborted by revascularization of left ICA Etiology: Critical left ICA stenosis Code Stroke CT head No acute abnormality. ASPECTS 10.    CTA head & neck negative for LVO but very severe atherosclerosis with left ICA origin radiographic string sign and bilateral ICA siphon stenosis, right ICA distal bulb high-grade stenosis, 2 to 3 mm left MCA bifurcation saccular aneurysm, moderate bilateral vertebral artery stenosis MRI no acute infarct, chronic dural venous sinus thrombosis with collaterals 2D Echo EF 35 to 40%, grade 2 diastolic dysfunction with akinetic inferior and posterior wall, moderately dilated left atrium, moderate tricuspid regurgitation, no atrial level shunt Vas Carotid US -1 to 39% stenosis in right ICA, 50 to 74% stenosis in the left ICA EEG- This study is within normal limits. No seizures or epileptiform discharges were seen throughout the recording. A normal interictal EEG does not exclude the diagnosis of epilepsy. LDL 75 HgbA1c 8.6 VTE prophylaxis -subcutaneous heparin  aspirin  81 mg daily prior to admission, now on aspirin  81 mg daily and Brilinta  (ticagrelor ) 90 mg bid per interventional radiologist Therapy recommendations: SNF Disposition: Pending  Bilateral carotid stenosis Severe bilateral carotid stenosis seen on CT angiogram On carotid ultrasound, stenosis in right ICA is 1 to 39%, 50 to 74% in left ICA Stenting of left ICA performed, patient on aspirin  and Brilinta  Carotid ultrasound to evaluate right ICA pending Will explore options for right ICA stenosis with neurovascular radiology as an outpatient 1 month after discharge  Hypertension Home meds: Amlodipine  10 mg daily, carvedilol  25 mg twice daily Stable Blood Pressure Goal: SBP 120-160 for first 24 hours then less than 180   Hyperlipidemia Home meds: Atorvastatin  80 mg daily, resumed in hospital LDL 75, goal < 70 Add ezetimibe  10 mg daily Continue statin at discharge  Diabetes  type II Uncontrolled Home meds: NovoLog  70/30 15 units 3 times daily with meals HgbA1c 8.6, goal < 7.0 CBGs SSI Recommend close follow-up with PCP for better DM control  End-stage renal disease on dialysis Patient is typically dialyzed Monday Wednesday Friday Nephrology consulted for inpatient dialysis   Other Stroke Risk Factors Obesity, Body mass index is 26.44 kg/m., BMI >/= 30 associated with increased stroke risk, recommend weight loss, diet and exercise as appropriate  Coronary artery disease Congestive heart failure   Other Active Problems None  Hospital day # 4  Patient seen and examined by NP/APP with MD. MD to update note as needed.   Cortney E Bucky Cardinal , MSN, AGACNP-BC [facility] See Amion for schedule and pager information 04/27/2024 11:24 AM   I have personally obtained history,examined this patient, reviewed notes, independently viewed imaging studies, participated in medical decision making and plan  of care.ROS completed by me personally and pertinent positives fully documented  I have made any additions or clarifications directly to the above note. Agree with note above.  Patient medically stable to be transferred to SNF when bed available.  Will consult cardiology for low EF.  Greater than 50% time during this 35-minute visit was spent in counseling and coordination of care and discussion patient care team answering questions.  Ardella Beaver, MD Medical Director [facility] Pager: [phone] 04/27/2024 2:13 PM  To contact Stroke Continuity provider, please refer to [email or web address]. After hours, contact General Neurology

## 2024-04-27 NOTE — TOC Transition Note (Signed)
 Transition of Care [facility]) - Discharge Note   Patient Details  Name: Terry Arroyo MRN: [id number] Date of Birth: 1961-12-10  Transition of Care [facility]) CM/SW Contact:  Jonathan Neighbor, RN Phone Number: 04/27/2024, 1:45 PM   Clinical Narrative:     Patient has decided to discharge home with home health services though [facility]. She has used [facility] in the past and asked to use them again. Information on the AVS. [facility] will contact her for the first home visit. Pt has needed DME at home: walker/ cane/ shower seat/ BSC. Pt states she has family and neighbors that will check on her at home. She receives her medications in blister packet from the pharmacy. Pt uses [facility] for appts and HD. She is going to call [facility] and update them that she will be needing transportation again on Monday. CM has asked HD SW to update her HD clinic.    Final next level of care: Home w Home Health Services Barriers to Discharge: No Barriers Identified   Patient Goals and CMS Choice Patient states their goals for this hospitalization and ongoing recovery are:: Rehab CMS Medicare.gov Compare Post Acute Care list provided to:: Patient Choice offered to / list presented to : Patient [place] ownership interest in [facility].provided to:: Patient    Discharge Placement                       Discharge Plan and Services Additional resources added to the After Visit Summary for   In-house Referral: Clinical Social Work   Post Acute Care Choice: Skilled Nursing Facility                               Social Drivers of Health (SDOH) Interventions SDOH Screenings   Food Insecurity: No Food Insecurity (10/13/2023)   Received from [facility]  Housing: Unknown (01/17/2024)   Received from [facility]  Transportation Needs: No Transportation Needs (10/13/2023)   Received from [facility]  Utilities: Not At  Risk (10/13/2023)   Received from [facility]  Depression [phone]): Low Risk  (07/08/2022)  Financial Resource Strain: Low Risk  (10/13/2023)   Received from [facility]  Tobacco Use: Medium Risk (04/23/2024)     Readmission Risk Interventions    10/29/2021   10:30 AM  Readmission Risk Prevention Plan  Transportation Screening Complete  Medication Review (RN Care Manager) Complete  PCP or Specialist appointment within 3-5 days of discharge Complete  SW Recovery Care/Counseling Consult Complete  Palliative Care Screening Not Applicable  Skilled Nursing Facility Not Applicable

## 2024-04-27 NOTE — Progress Notes (Signed)
 Physical Therapy Treatment Patient Details Name: Terry Arroyo MRN: [id number] DOB: 01/17/1961 Today's Date: 04/27/2024   History of Present Illness 63 y.o. female admitted 04/23/24 with R side weakness, aphasia. Imaging showed severe L ICA stenosis, 2-64mm L MCA saccular aneurysm. S/p emergent L ICA angioplasty, balloon placement. EEG WNL. PMH includes ESRD (HD MWF), CAD s/p CABG, DM2, HFrEF, pituitary adenoma w/ visual field defect.   PT Comments  Pt progressing well with mobility. Pt able to transfer and ambulate with and without rollator; performing self-care tasks without assist. Pt notes improvement in RUE strength, able to use functionally throughout session. Pt no longer interested in SNF rehab, plans to return home with intermittent assist from neighbors and friends; owns necessary DME, agreeable to HHPT. If to remain admitted, will continue to follow acutely to address established goals.     If plan is discharge home, recommend the following: Assistance with cooking/housework;Assist for transportation   Can travel by private vehicle      Yes  Equipment Recommendations  None recommended by PT    Recommendations for Other Services       Precautions / Restrictions Precautions Precautions: Fall Recall of Precautions/Restrictions: Intact Precaution/Restrictions Comments: goal SBP <180 Restrictions Weight Bearing Restrictions Per Provider Order: No     Mobility  Bed Mobility Overal bed mobility: Modified Independent Bed Mobility: Supine to Sit                Transfers Overall transfer level: Independent Equipment used: Rollator (4 wheels), None Transfers: Sit to/from Stand             General transfer comment: multiple sit<>stands from EOB with and without rollator; indep sit<>stand from low toilet height without DME    Ambulation/Gait Ambulation/Gait assistance: Contact guard assist, Supervision Gait Distance (Feet): 240 Feet (+ 40') Assistive device:  Rollator (4 wheels), None Gait Pattern/deviations: Step-through pattern, Decreased stride length Gait velocity: Decreased     General Gait Details: slow, steady gait ~240' with rollator and supervision for safety; additional ambulation to/from bathroom without DME, supervision; no overt instability or LOB noted   Stairs             Wheelchair Mobility     Tilt Bed    Modified Rankin (Stroke Patients Only) Modified Rankin (Stroke Patients Only) Pre-Morbid Rankin Score: No significant disability Modified Rankin: Slight disability     Balance Overall balance assessment: Needs assistance Sitting-balance support: Feet supported Sitting balance-Leahy Scale: Good     Standing balance support: No upper extremity supported, During functional activity Standing balance-Leahy Scale: Fair Standing balance comment: can ambulate without UE support; performing toileting and ADLs standing at sink without UE support                            Communication Communication Communication: No apparent difficulties  Cognition   Behavior During Therapy: Flat affect   PT - Cognitive impairments: No apparent impairments                       PT - Cognition Comments: no significant cognition impairments noted during session, not formally assessed. do not notice decreased initiation this session Following commands: Intact      Cueing    Exercises Other Exercises Other Exercises: pt noted to be using R hand functionally during session, including use of cell phone and ADLs. pt reports she feels squeeze ball has helped make hand stronger  General Comments General comments (skin integrity, edema, etc.): reviewed educ re: role of acute PT, POC, activity recommendations, importance of mobility. pt reports desire to return home instead of SNF rehab; increased time problem solving through various needs to ensure safe transition home; pt reports intermittent assist available  from neighbors and family, has rollator and BSC already set up next to her bed; pt agreeable to HHPT services. discussed with SW/CM      Pertinent Vitals/Pain Pain Assessment Pain Assessment: Faces Faces Pain Scale: No hurt Pain Intervention(s): Monitored during session    Home Living                          Prior Function            PT Goals (current goals can now be found in the care plan section) Acute Rehab PT Goals Patient Stated Goal: return home PT Goal Formulation: With patient Progress towards PT goals: Progressing toward goals    Frequency    Min 2X/week      PT Plan      Co-evaluation              AM-PAC PT "6 Clicks" Mobility   Outcome Measure  Help needed turning from your back to your side while in a flat bed without using bedrails?: None Help needed moving from lying on your back to sitting on the side of a flat bed without using bedrails?: None Help needed moving to and from a bed to a chair (including a wheelchair)?: None Help needed standing up from a chair using your arms (e.g., wheelchair or bedside chair)?: None Help needed to walk in hospital room?: A Little Help needed climbing 3-5 steps with a railing? : A Little 6 Click Score: 22    End of Session Equipment Utilized During Treatment: Gait belt Activity Tolerance: Patient tolerated treatment well Patient left: in bed;with call bell/phone within reach Nurse Communication: Mobility status;Other (comment) (RN ok with pt walking to/from bathroom without assist, confirmed with pt) PT Visit Diagnosis: Unsteadiness on feet (R26.81);Difficulty in walking, not elsewhere classified (R26.2);Hemiplegia and hemiparesis Hemiplegia - Right/Left: Right Hemiplegia - dominant/non-dominant: Non-dominant     Time: 1150-1209 PT Time Calculation (min) (ACUTE ONLY): 19 min  Charges:    $Gait Training: 8-22 mins PT General Charges $$ ACUTE PT VISIT: 1 Visit                       Blase Bur, PT, DPT Acute Rehabilitation Services  Personal: Secure Chat Rehab Office: [phone]  Albino Hum 04/27/2024, 12:57 PM

## 2024-04-27 NOTE — Discharge Summary (Shared)
 Stroke Discharge Summary  Patient ID: Terry Arroyo   MRN: [id number]      DOB: [birth year]  Date of Admission: 04/23/2024 Date of Discharge: 04/27/2024  Attending Physician:  Stroke, Md, MD Consultant(s):    pulmonary/intensive care and nephrology  Patient's PCP:  Jimmy Moulding, MD  DISCHARGE PRIMARY DIAGNOSIS:  Acute ischemic stroke aborted by revascularization of left ICA Etiology: Critical left ICA stenosis  Patient Active Problem List   Diagnosis Date Noted   Carotid stenosis, bilateral 04/24/2024   Stroke (HCC) 04/23/2024   Acute ischemic left MCA stroke (HCC) 04/23/2024   Ambulatory dysfunction 12/08/2023   Gait instability 12/07/2023   Sinus bradycardia 12/07/2023   Shortness of breath 05/02/2023   Weakness    Elevated BUN    Nausea and vomiting 04/14/2022   Generalized weakness 04/14/2022   Dyslipidemia 04/14/2022   GERD without esophagitis 04/14/2022   Type 2 diabetes mellitus without complications (HCC) 04/14/2022   Uncontrolled type 2 diabetes mellitus with hyperglycemia, with long-term current use of insulin  (HCC) 04/14/2022   Physical debility 02/13/2022   Acute pain of left thigh 02/09/2022   Cellulitis of left thigh 02/09/2022   Atherosclerosis of native arteries of extremity with intermittent claudication (HCC) 02/04/2022   Leg pain, anterior, left 02/04/2022   Angioedema 10/28/2021   Lactic acidosis    Diabetic polyneuropathy associated with type 2 diabetes mellitus (HCC)    Sepsis (HCC) 09/05/2021   Acute CHF (congestive heart failure) (HCC) 09/05/2021   Fluid overload 06/11/2021   Hypokalemia 06/11/2021   Acute pulmonary edema (HCC)    COPD (chronic obstructive pulmonary disease) with emphysema (HCC) 02/09/2021   Chronic sphenoidal sinusitis 01/01/2021   Contact dermatitis and other eczema due to other specified agent 01/01/2021   Hydronephrosis, left 01/01/2021   Other diseases of nasal cavity and sinuses(478.19) 01/01/2021   End-stage  renal disease on hemodialysis (HCC)    Reactive thrombocytosis 11/08/2020   Nephrotic syndrome 11/04/2020   SOB (shortness of breath) 11/01/2020   Acute on chronic combined systolic and diastolic CHF (congestive heart failure) (HCC) 10/21/2020   CAD (coronary artery disease) 10/21/2020   Hyperlipidemia associated with type 2 diabetes mellitus (HCC) 10/21/2020   HFrEF (heart failure with reduced ejection fraction) (HCC)    Acute respiratory distress    COPD exacerbation (HCC) 10/10/2020   Kidney hematoma 09/13/2020   Essential hypertension    Acute renal failure superimposed on stage 4 chronic kidney disease (HCC)    Hematuria 09/12/2020   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) 09/03/2020   Blind left eye 09/03/2020   Brain aneurysm 09/03/2020   Esotropia 09/03/2020   Lesion of ulnar nerve 09/03/2020   Mixed hyperlipidemia 09/03/2020   Sensory hearing loss, bilateral 09/03/2020   Tear film insufficiency 09/03/2020   Health maintenance examination 06/27/2020   Secondary hyperparathyroidism of renal origin (HCC) 04/24/2020   Anemia in chronic kidney disease 03/10/2020   Benign hypertensive kidney disease with chronic kidney disease 03/10/2020   Hyposmolality and/or hyponatremia 03/10/2020   Stage 3b chronic kidney disease (HCC) 03/10/2020   Calf tenderness    Leg edema    Type 2 diabetes mellitus with hyperlipidemia (HCC) 02/26/2020   CKD (chronic kidney disease) stage 4, GFR 15-29 ml/min (HCC) 02/26/2020   Hyponatremia 02/26/2020   Anasarca 02/26/2020   Proteinuria 02/26/2020   Hypoalbuminemia 02/26/2020   Elevated troponin 02/26/2020   Acute on chronic systolic CHF (congestive heart failure) (HCC) 02/26/2020   Aortic  atherosclerosis (HCC) 02/18/2020   Hyperglycemia    Nausea    Hypertensive emergency    Myalgia due to statin 12/04/2019   Hypertensive urgency 11/24/2019   Chest pain 11/23/2019   OSA (obstructive sleep apnea) 04/10/2019   Mild  episode of recurrent major depressive disorder (HCC) 04/10/2019   ACS (acute coronary syndrome) (HCC) 03/24/2019   COVID-19 virus infection 03/24/2019   Hypertensive heart/kidney disease w/chronic kidney disease stage III (HCC) 10/21/2017   Mild nonproliferative diabetic retinopathy of right eye associated with type 2 diabetes mellitus (HCC) 09/09/2017   Genital herpes 05/07/2017   DVT (deep venous thrombosis) (HCC) 12/30/2016   History of DVT (deep vein thrombosis) 12/30/2016   Hx of CABG 2018   PVD (posterior vitreous detachment), right eye 01/09/2016   Diabetic peripheral angiopathy (HCC) 01/03/2016   Age-related nuclear cataract of both eyes 10/24/2015   Chronic nonintractable headache 10/02/2015   Dry eyes, bilateral 02/13/2015   Proptosis 12/18/2014   Iritis of right eye 12/03/2014   Anemia 08/30/2014   Depression 08/30/2014     Allergies as of 04/27/2024       Reactions   Hydralazine  Hives, Shortness Of Breath, Swelling, Rash   Body aches, also   Kiwi Extract Hives, Swelling, Rash, Other (See Comments)   Mouth swells    Lisinopril  Swelling, Other (See Comments)   Angioedema   Shellfish Allergy Anaphylaxis, Other (See Comments)   Shrimp/lobster    Betadine [povidone Iodine ] Hives, Rash   LEAVES WELTS ON SKIN   Septra [sulfamethoxazole-trimethoprim] Other (See Comments)   Reaction not stated   Metformin Diarrhea, Other (See Comments)   GI Intolerance   Tape Itching   Use paper tape whenever possible     Med Rec must be completed prior to using this SMARTLINK***       LABORATORY STUDIES CBC    Component Value Date/Time   WBC 8.2 04/26/2024 0959   RBC 3.49 (L) 04/26/2024 0959   HGB 10.3 (L) 04/26/2024 0959   HGB 12.6 11/18/2013 0138   HCT 31.3 (L) 04/26/2024 0959   HCT 37.9 11/18/2013 0138   PLT 190 04/26/2024 0959   PLT 444 (H) 11/18/2013 0138   MCV 89.7 04/26/2024 0959   MCV 83 11/18/2013 0138   MCH 29.5 04/26/2024 0959   MCHC 32.9 04/26/2024 0959    RDW 14.5 04/26/2024 0959   RDW 14.1 11/18/2013 0138   LYMPHSABS 1.3 04/23/2024 1230   MONOABS 0.6 04/23/2024 1230   EOSABS 0.4 04/23/2024 1230   BASOSABS 0.1 04/23/2024 1230   CMP    Component Value Date/Time   NA 132 (L) 04/26/2024 0959   NA 134 (L) 11/18/2013 0138   K 4.4 04/26/2024 0959   K 3.8 11/18/2013 0138   CL 95 (L) 04/26/2024 0959   CL 98 11/18/2013 0138   CO2 22 04/26/2024 0959   CO2 29 11/18/2013 0138   GLUCOSE 176 (H) 04/26/2024 0959   GLUCOSE 348 (H) 11/18/2013 0138   BUN 62 (H) 04/26/2024 0959   BUN 11 11/18/2013 0138   CREATININE 9.03 (H) 04/26/2024 0959   CREATININE 0.87 11/18/2013 0138   CALCIUM  8.7 (L) 04/26/2024 0959   CALCIUM  9.4 11/18/2013 0138   PROT 7.2 04/24/2024 0616   ALBUMIN  3.4 (L) 04/26/2024 0959   AST 11 (L) 04/24/2024 0616   ALT 9 04/24/2024 0616   ALKPHOS 74 04/24/2024 0616   BILITOT 0.9 04/24/2024 0616   GFRNONAA 5 (L) 04/26/2024 0959   GFRNONAA >60 11/18/2013 0138  GFRAA 18 (L) 09/15/2020 0403   GFRAA >60 11/18/2013 0138   COAGS Lab Results  Component Value Date   INR 1.1 04/24/2024   INR 1.3 (H) 04/23/2024   INR 1.2 03/29/2022   Lipid Panel    Component Value Date/Time   CHOL 137 04/24/2024 0616   TRIG 144 04/24/2024 0616   HDL 33 (L) 04/24/2024 0616   CHOLHDL 4.2 04/24/2024 0616   VLDL 29 04/24/2024 0616   LDLCALC 75 04/24/2024 0616   HgbA1C  Lab Results  Component Value Date   HGBA1C 8.6 (H) 04/23/2024   Alcohol  Level    Component Value Date/Time   [facility] <15 04/23/2024 1230     SIGNIFICANT DIAGNOSTIC STUDIES ECHOCARDIOGRAM COMPLETE Result Date: 04/25/2024    ECHOCARDIOGRAM REPORT   Patient Name:   Terry Arroyo Date of Exam: 04/25/2024 Medical Rec #:  [id number]       Height:       66.0 in Accession #:    [id number]      Weight:       162.0 lb Date of Birth:  1961-03-21        BSA:          1.829 m Patient Age:    62 years        BP:           154/61 mmHg Patient Gender: F               HR:           63 bpm. Exam  Location:  Inpatient Procedure: 2D Echo, Color Doppler and Cardiac Doppler (Both Spectral and Color            Flow Doppler were utilized during procedure). Indications:    Stroke  History:        Patient has prior history of Echocardiogram examinations, most                 recent 12/08/2023.  Sonographer:    Andrena Bang Referring Phys: Leandro Proffer DE LA TORRE IMPRESSIONS  1. Left ventricular ejection fraction, by estimation, is 35 to 40%. The left ventricle has moderately decreased function. The left ventricle demonstrates regional wall motion abnormalities (see scoring diagram/findings for description). Left ventricular  diastolic parameters are consistent with Grade II diastolic dysfunction (pseudonormalization).  2. Right ventricular systolic function is mildly reduced. The right ventricular size is moderately enlarged. There is moderately elevated pulmonary artery systolic pressure. The estimated right ventricular systolic pressure is 50.0 mmHg.  3. Left atrial size was moderately dilated.  4. Right atrial size was moderately dilated.  5. The mitral valve is normal in structure. Mild to moderate mitral valve regurgitation. No evidence of mitral stenosis. Moderate mitral annular calcification.  6. Tricuspid valve regurgitation is moderate.  7. The aortic valve is calcified. There is mild calcification of the aortic valve. There is mild thickening of the aortic valve. Aortic valve regurgitation is not visualized. No aortic stenosis is present. Aortic valve mean gradient measures 6.0 mmHg. Aortic valve Vmax measures 1.81 m/s.  8. The inferior vena cava is dilated in size with >50% respiratory variability, suggesting right atrial pressure of 8 mmHg. Conclusion(s)/Recommendation(s): No intracardiac source of embolism detected on this transthoracic study. Consider a transesophageal echocardiogram to exclude cardiac source of embolism if clinically indicated. FINDINGS  Left Ventricle: Left ventricular ejection fraction,  by estimation, is 35 to 40%. The left ventricle has moderately decreased function. The left ventricle demonstrates regional wall  motion abnormalities. The left ventricular internal cavity size was normal in size. There is no left ventricular hypertrophy. Left ventricular diastolic parameters are consistent with Grade II diastolic dysfunction (pseudonormalization).  LV Wall Scoring: The inferior wall and posterior wall are akinetic. Right Ventricle: The right ventricular size is moderately enlarged. No increase in right ventricular wall thickness. Right ventricular systolic function is mildly reduced. There is moderately elevated pulmonary artery systolic pressure. The tricuspid regurgitant velocity is 3.24 m/s, and with an assumed right atrial pressure of 8 mmHg, the estimated right ventricular systolic pressure is 50.0 mmHg. Left Atrium: Left atrial size was moderately dilated. Right Atrium: Right atrial size was moderately dilated. Pericardium: There is no evidence of pericardial effusion. Mitral Valve: The mitral valve is normal in structure. Moderate mitral annular calcification. Mild to moderate mitral valve regurgitation. No evidence of mitral valve stenosis. MV peak gradient, 9.0 mmHg. The mean mitral valve gradient is 3.0 mmHg. Tricuspid Valve: The tricuspid valve is normal in structure. Tricuspid valve regurgitation is moderate . No evidence of tricuspid stenosis. Aortic Valve: The aortic valve is calcified. There is mild calcification of the aortic valve. There is mild thickening of the aortic valve. Aortic valve regurgitation is not visualized. No aortic stenosis is present. Aortic valve mean gradient measures 6.0 mmHg. Aortic valve peak gradient measures 13.1 mmHg. Aortic valve area, by VTI measures 1.21 cm. Pulmonic Valve: The pulmonic valve was normal in structure. Pulmonic valve regurgitation is not visualized. No evidence of pulmonic stenosis. Aorta: The aortic root is normal in size and structure.  Venous: The inferior vena cava is dilated in size with greater than 50% respiratory variability, suggesting right atrial pressure of 8 mmHg. IAS/Shunts: No atrial level shunt detected by color flow Doppler.  LEFT VENTRICLE PLAX 2D LVIDd:         6.00 cm      Diastology LVIDs:         5.10 cm      LV e' medial:    4.24 cm/s LV PW:         0.85 cm      LV E/e' medial:  31.6 LV IVS:        1.15 cm      LV e' lateral:   7.18 cm/s LVOT diam:     1.70 cm      LV E/e' lateral: 18.7 LV SV:         48 LV SV Index:   26 LVOT Area:     2.27 cm  LV Volumes (MOD) LV vol d, MOD A2C: 152.0 ml LV vol d, MOD A4C: 143.0 ml LV vol s, MOD A2C: 82.1 ml LV vol s, MOD A4C: 92.2 ml LV SV MOD A2C:     69.9 ml LV SV MOD A4C:     143.0 ml LV SV MOD BP:      62.8 ml RIGHT VENTRICLE RV S prime:     6.31 cm/s TAPSE (M-mode): 1.1 cm LEFT ATRIUM           Index LA diam:      4.00 cm 2.19 cm/m LA Vol (A2C): 78.2 ml 42.76 ml/m LA Vol (A4C): 44.5 ml 24.33 ml/m  AORTIC VALVE AV Area (Vmax):    1.14 cm AV Area (Vmean):   1.13 cm AV Area (VTI):     1.21 cm AV Vmax:           181.00 cm/s AV Vmean:  111.000 cm/s AV VTI:            0.401 m AV Peak Grad:      13.1 mmHg AV Mean Grad:      6.0 mmHg LVOT Vmax:         90.80 cm/s LVOT Vmean:        55.200 cm/s LVOT VTI:          0.213 m LVOT/AV VTI ratio: 0.53  AORTA Ao Asc diam: 3.10 cm MITRAL VALVE                TRICUSPID VALVE MV Area (PHT): 5.27 cm     TV Peak grad:   42.0 mmHg MV Area VTI:   1.18 cm     TV Vmax:        3.24 m/s MV Peak grad:  9.0 mmHg     TR Peak grad:   42.0 mmHg MV Mean grad:  3.0 mmHg     TR Vmax:        324.00 cm/s MV Vmax:       1.50 m/s MV Vmean:      75.5 cm/s    SHUNTS MV Decel Time: 144 msec     Systemic VTI:  0.21 m MV E velocity: 134.00 cm/s  Systemic Diam: 1.70 cm MV A velocity: 89.40 cm/s MV E/A ratio:  1.50 Dorothye Gathers MD Electronically signed by Dorothye Gathers MD Signature Date/Time: 04/25/2024/3:19:00 PM    Final    VAS US  CAROTID Result Date:  04/25/2024 Carotid Arterial Duplex Study Patient Name:  Terry Arroyo  Date of Exam:   04/25/2024 Medical Rec #: [id number]        Accession #:    [id number] Date of Birth: 06-24-1961         Patient Gender: F Patient Age:   81 years Exam Location:  [facility] Procedure:      VAS US  CAROTID Referring Phys: Margart Shears DE LA TORRE --------------------------------------------------------------------------------  Indications:       CVA. Risk Factors:      Hypertension, hyperlipidemia, prior MI, coronary artery                    disease, prior CVA, PAD. Comparison Study:  04/23/2024 CTA neck- 1. Negative for large vessel occlusion,                    But Positive for VERY SEVERE underlying atherosclerosis with:                    - LICA origin and bilateral ICA siphon RADIOGRAPHIC STRING                    SIGN STENOSES.                    - Right ICA distal bulb high-grade stenosis, estimated 70-80%                    and                    approaching a radiographic string sign. Performing Technologist: Delford Felling MHA, RDMS, RVT, RDCS  Examination Guidelines: A complete evaluation includes B-mode imaging, spectral Doppler, color Doppler, and power Doppler as needed of all accessible portions of each vessel. Bilateral testing is considered an integral part of a complete examination. Limited examinations for reoccurring indications may be performed as noted.  Right Carotid Findings: +----------+--------+--------+--------+-----------------------+--------+           PSV cm/sEDV cm/sStenosisPlaque Description     Comments +----------+--------+--------+--------+-----------------------+--------+ CCA Prox  123     14                                              +----------+--------+--------+--------+-----------------------+--------+ CCA Distal79      8               heterogenous and smooth         +----------+--------+--------+--------+-----------------------+--------+ ICA Prox  170     36       1-39%   smooth and heterogenous         +----------+--------+--------+--------+-----------------------+--------+ ICA Mid   126     24                                              +----------+--------+--------+--------+-----------------------+--------+ ICA Distal70      12                                              +----------+--------+--------+--------+-----------------------+--------+ ECA       116                                                     +----------+--------+--------+--------+-----------------------+--------+ +----------+--------+-------+--------+--------------------+           PSV cm/sEDV cmsDescribeArm Pressure (mmHG)  +----------+--------+-------+--------+--------------------+ [id number]            StenoticRestricted extremity +----------+--------+-------+--------+--------------------+ +---------+--------+--+--------+-+---------+ VertebralPSV cm/s34EDV cm/s4Antegrade +---------+--------+--+--------+-+---------+  Left Carotid Findings: +----------+--------+--------+--------+------------------+--------+           PSV cm/sEDV cm/sStenosisPlaque DescriptionComments +----------+--------+--------+--------+------------------+--------+ CCA Prox  77      9                                          +----------+--------+--------+--------+------------------+--------+ CCA Distal                                          stent    +----------+--------+--------+--------+------------------+--------+ ICA Prox                                            stent    +----------+--------+--------+--------+------------------+--------+ ICA Mid                                             stent    +----------+--------+--------+--------+------------------+--------+ ICA Distal104     24                                         +----------+--------+--------+--------+------------------+--------+  ECA       287     13                                          +----------+--------+--------+--------+------------------+--------+ +----------+--------+--------+----------------+-------------------+           PSV cm/sEDV cm/sDescribe        Arm Pressure (mmHG) +----------+--------+--------+----------------+-------------------+ [id number]              Multiphasic, [id number]                 +----------+--------+--------+----------------+-------------------+ +---------+--------+---+--------+--+---------+ VertebralPSV cm/s101EDV cm/s23Antegrade +---------+--------+---+--------+--+---------+  Left Stent(s): +---------------+--------+--------+--------+--------+--------+ ICA            PSV cm/sEDV cm/sStenosisWaveformComments +---------------+--------+--------+--------+--------+--------+ Prox to Stent  96      17                               +---------------+--------+--------+--------+--------+--------+ Proximal Stent 82      12                               +---------------+--------+--------+--------+--------+--------+ Mid Stent      227     27                               +---------------+--------+--------+--------+--------+--------+ Distal Stent   101     24                               +---------------+--------+--------+--------+--------+--------+ Distal to Stent104     24                               +---------------+--------+--------+--------+--------+--------+    Summary: Right Carotid: Velocities in the right ICA are consistent with an upper range                1-39% stenosis. Left Carotid: Left ICA stent is patent with elevated velocities suggestive of               50-74% stenosis. Vertebrals:  Bilateral vertebral arteries demonstrate antegrade flow. Subclavians: Right subclavian artery was stenotic. Normal flow hemodynamics were              seen in the left subclavian artery. *See table(s) above for measurements and observations.     Preliminary    EEG adult Result Date: 04/25/2024 Arleene Lack, MD     04/25/2024 10:18 AM Patient Name: Terry Arroyo MRN: [id number] Epilepsy Attending: Arleene Lack Referring Physician/Provider: Colon Dear, NP Date: 04/25/2024 Duration: 25.36 mins Patient history: 63 year old female with acute ischemic stroke getting EEG to evaluate for seizure. Level of alertness: Awake AEDs during EEG study: GBP, TPM Technical aspects: This EEG study was done with scalp electrodes positioned according to the 10-20 International system of electrode placement. Electrical activity was reviewed with band pass filter of 1-70Hz , sensitivity of 7 uV/mm, display speed of 26mm/sec with a 60Hz  notched filter applied as appropriate. EEG data were recorded continuously and digitally stored.  Video monitoring was available and reviewed as appropriate. Description: The posterior dominant rhythm consists of 8 Hz activity of moderate voltage (  25-35 uV) seen predominantly in posterior head regions, symmetric and reactive to eye opening and eye closing. Hyperventilation and photic stimulation were not performed.   IMPRESSION: This study is within normal limits. No seizures or epileptiform discharges were seen throughout the recording. A normal interictal EEG does not exclude the diagnosis of epilepsy. Arleene Lack   MR BRAIN WO CONTRAST Result Date: 04/24/2024 CLINICAL DATA:  Stroke follow-up EXAM: MRI HEAD WITHOUT CONTRAST TECHNIQUE: Multiplanar, multiecho pulse sequences of the brain and surrounding structures were obtained without intravenous contrast. COMPARISON:  12/07/2023 FINDINGS: Brain: No acute infarction, hemorrhage, hydrocephalus, extra-axial collection or mass lesion. Chronic generalized dural thickening with calcification by CT. Vascular: Diffusely attenuated superior sagittal sinus with absent flow void continuing into the left sigmoid sinus, chronic partial thrombosis by prior enhanced imaging with prominent venous collaterals along the right inferior convexity.  Chronic thrombus associated with calcifications. Skull and upper cervical spine: Diffusely hypointense marrow signal. No focal lesion seen. Sinuses/Orbits: Prior endoscopic sinus surgery. Right proptosis not associated with acute inflammation, stable from 2024. Nasopharyngeal retention cysts. IMPRESSION: No acute finding.  Negative for acute infarct. Chronic dural venous sinus thrombosis with collaterals. Electronically Signed   By: Ronnette Coke M.D.   On: 04/24/2024 06:03   IR PERCUTANEOUS ART THROMBECTOMY/INFUSION INTRACRANIAL INC DIAG ANGIO Result Date: 04/23/2024 PROCEDURE PERFORMED: 1. Stroke thrombectomy 2. Ultrasound vascular access 3. Cone beam CT for treatment planning COMPARISON:  CT perfusion performed Apr 23, 2024. CLINICAL DATA:  63 year old female with acute ischemic stroke with symptoms of right motor dysfunction sensory deficits and dysarthria with an NIH stroke scale measuring 14. CT imaging demonstrated a severe proximal left internal carotid artery stenosis and perfusion imaging suggested hypoperfusion to the left hemisphere. After discussing the case in a multidisciplinary fashion, and confirming that there was no immediate available decision maker, the patient was transferred for thrombectomy and emergent 2 physician consent was documented. INDICATION: Acute ischemic ischemic stroke with left hemi severe hypoperfusion. ANESTHESIA/SEDATION: General anesthesia was utilized for the procedure. CONTRAST:  Approximately 75 cc Omnipaque  300 320 MEDICATIONS: Heparin  6000 units intravenously Brilinta  180 mg per tube FLUOROSCOPY TIME:  Fluoroscopy Time: 15.8 minutes (737 mGy). COMPLICATIONS: None immediate. BODY OF REPORT: The patient was then put under general anesthesia by the Department of Anesthesiology at [facility]. Insert ultrasound access The right groin was prepped and draped in the usual sterile fashion. Ultrasound was used to study the right common femoral artery which was patent.  Using real-time ultrasound guidance, a 19 gauge introducer needle was used to access the right common femoral artery. Access was performed at 1357. A hard copy image ultrasound the same date uncertain PACS. Using this access, a 6 French sheath was placed in the descending thoracic aorta. Approximately 5000 units of heparin  was administered intravenously. Next, selective catheterization the left common carotid artery was performed. A selective arteriogram was performed which demonstrated that the internal carotid artery was patent. But demonstrated a severe focal stenosis estimated at greater than 80% in the single frontal plane of imaging. This was use a lies as it best profiled the bifurcation between the internal carotid artery and external carotid artery. There was no evidence of intracranial filling defect. Scattered mild diffuse atherosclerotic changes are present. Next, the lesion was crossed with a catheter and guidewire. At this point, my tempted to deployed embolic protection device, however, I could not safely deployed the device in the proximal internal carotid artery secondary to tortuosity and concern for device detachment. Therefore,  the proximal stenosis was treated with balloon angioplasty using a 4 mm noncompliant balloon. This was considered the first pass for the purposes of documentation and this was performed at 14:20. Next, a precise 6 mm x 40 mm self expanding stent was deployed in the proximal left internal carotid artery extending into the common carotid artery. Deployment was completed at 14:50 and this was considered recanalization. There was a residual waist and therefore stent dilation was performed using a 5 mm noncompliant balloon. A post treatment arteriogram demonstrated no evidence of significant residual proximal stenosis. There was good apposition of the bare metal stent. There was persistent filling of the left external carotid artery. The intracranial arteries were improved in  terms of caliber. There was no evidence of filling defect to suggest peripheral embolus. I elected to terminate the procedure at this point. The revascularization score was graded at TICI-3 Evaluation of the right femoral access site demonstrated at this site was suitable for closure device. A 6 French Angio-Seal device was deployed without complication. Become beam CT was then performed to evaluate for intracranial hemorrhage and treatment planning. This demonstrated no evidence significant acute intracranial hemorrhage. Fluoroscopic imaging confirmed placement of an enteric tube within the stomach. At this point, 180 mg of Brilinta  was administered per tube. The patient was then transferred to the ICU in stable condition while remaining intubated. IMPRESSION: 1. Emergent left internal carotid artery stent placement due to clinical concern for is global left-sided hypoperfusion and developing stroke in the setting of severe left internal carotid artery stenosis. PLAN: 1. To ICU for routine postoperative supportive care. 2. The patient will continue on a heparin  infusion intravenously at 500 units/hour for the next 6 hours. At that point, heparin  will be discontinued. 3. The patient has received a bolus dose of aspirin  PR and a bolus dose of 180 mg of Brilinta  per tube. Electronically Signed   By: Reagan Camera M.D.   On: 04/23/2024 16:31   IR US  Guide Vasc Access Right Result Date: 04/23/2024 PROCEDURE PERFORMED: 1. Stroke thrombectomy 2. Ultrasound vascular access 3. [facility]one beam CT for treatment planning COMPARISON:  CT perfusion performed Apr 23, 2024. CLINICAL DATA:  63 year old female with acute ischemic stroke with symptoms of right motor dysfunction sensory deficits and dysarthria with an NIH stroke scale measuring 14. CT imaging demonstrated a severe proximal left internal carotid artery stenosis and perfusion imaging suggested hypoperfusion to the left hemisphere. After discussing the case in a  multidisciplinary fashion, and confirming that there was no immediate available decision maker, the patient was transferred for thrombectomy and emergent 2 physician consent was documented. INDICATION: Acute ischemic ischemic stroke with left hemi severe hypoperfusion. ANESTHESIA/SEDATION: General anesthesia was utilized for the procedure. CONTRAST:  Approximately 75 cc Omnipaque  300 320 MEDICATIONS: Heparin  6000 units intravenously Brilinta  180 mg per tube FLUOROSCOPY TIME:  Fluoroscopy Time: 15.8 minutes (737 mGy). COMPLICATIONS: None immediate. BODY OF REPORT: The patient was then put under general anesthesia by the Department of Anesthesiology at [facility]. Insert ultrasound access The right groin was prepped and draped in the usual sterile fashion. Ultrasound was used to study the right common femoral artery which was patent. Using real-time ultrasound guidance, a 19 gauge introducer needle was used to access the right common femoral artery. Access was performed at 1357. A hard copy image ultrasound the same date uncertain PACS. Using this access, a 6 French sheath was placed in the descending thoracic aorta. Approximately 5000 units of heparin  was administered intravenously.  Next, selective catheterization the left common carotid artery was performed. A selective arteriogram was performed which demonstrated that the internal carotid artery was patent. But demonstrated a severe focal stenosis estimated at greater than 80% in the single frontal plane of imaging. This was use a lies as it best profiled the bifurcation between the internal carotid artery and external carotid artery. There was no evidence of intracranial filling defect. Scattered mild diffuse atherosclerotic changes are present. Next, the lesion was crossed with a catheter and guidewire. At this point, my tempted to deployed embolic protection device, however, I could not safely deployed the device in the proximal internal carotid artery  secondary to tortuosity and concern for device detachment. Therefore, the proximal stenosis was treated with balloon angioplasty using a 4 mm noncompliant balloon. This was considered the first pass for the purposes of documentation and this was performed at 14:20. Next, a precise 6 mm x 40 mm self expanding stent was deployed in the proximal left internal carotid artery extending into the common carotid artery. Deployment was completed at 14:50 and this was considered recanalization. There was a residual waist and therefore stent dilation was performed using a 5 mm noncompliant balloon. A post treatment arteriogram demonstrated no evidence of significant residual proximal stenosis. There was good apposition of the bare metal stent. There was persistent filling of the left external carotid artery. The intracranial arteries were improved in terms of caliber. There was no evidence of filling defect to suggest peripheral embolus. I elected to terminate the procedure at this point. The revascularization score was graded at TICI-3 Evaluation of the right femoral access site demonstrated at this site was suitable for closure device. A 6 French Angio-Seal device was deployed without complication. Become beam CT was then performed to evaluate for intracranial hemorrhage and treatment planning. This demonstrated no evidence significant acute intracranial hemorrhage. Fluoroscopic imaging confirmed placement of an enteric tube within the stomach. At this point, 180 mg of Brilinta  was administered per tube. The patient was then transferred to the ICU in stable condition while remaining intubated. IMPRESSION: 1. Emergent left internal carotid artery stent placement due to clinical concern for is global left-sided hypoperfusion and developing stroke in the setting of severe left internal carotid artery stenosis. PLAN: 1. To ICU for routine postoperative supportive care. 2. The patient will continue on a heparin  infusion  intravenously at 500 units/hour for the next 6 hours. At that point, heparin  will be discontinued. 3. The patient has received a bolus dose of aspirin  PR and a bolus dose of 180 mg of Brilinta  per tube. Electronically Signed   By: Reagan Camera M.D.   On: 04/23/2024 16:31   IR CT Head Ltd Result Date: 04/23/2024 PROCEDURE PERFORMED: 1. Stroke thrombectomy 2. Ultrasound vascular access 3. Cone beam CT for treatment planning COMPARISON:  CT perfusion performed Apr 23, 2024. CLINICAL DATA:  63 year old female with acute ischemic stroke with symptoms of right motor dysfunction sensory deficits and dysarthria with an NIH stroke scale measuring 14. CT imaging demonstrated a severe proximal left internal carotid artery stenosis and perfusion imaging suggested hypoperfusion to the left hemisphere. After discussing the case in a multidisciplinary fashion, and confirming that there was no immediate available decision maker, the patient was transferred for thrombectomy and emergent 2 physician consent was documented. INDICATION: Acute ischemic ischemic stroke with left hemi severe hypoperfusion. ANESTHESIA/SEDATION: General anesthesia was utilized for the procedure. CONTRAST:  Approximately 75 cc Omnipaque  300 320 MEDICATIONS: Heparin  6000 units intravenously Brilinta  180  mg per tube FLUOROSCOPY TIME:  Fluoroscopy Time: 15.8 minutes (737 mGy). COMPLICATIONS: None immediate. BODY OF REPORT: The patient was then put under general anesthesia by the Department of Anesthesiology at [facility]. Insert ultrasound access The right groin was prepped and draped in the usual sterile fashion. Ultrasound was used to study the right common femoral artery which was patent. Using real-time ultrasound guidance, a 19 gauge introducer needle was used to access the right common femoral artery. Access was performed at 1357. A hard copy image ultrasound the same date uncertain PACS. Using this access, a 6 French sheath was placed in the  descending thoracic aorta. Approximately 5000 units of heparin  was administered intravenously. Next, selective catheterization the left common carotid artery was performed. A selective arteriogram was performed which demonstrated that the internal carotid artery was patent. But demonstrated a severe focal stenosis estimated at greater than 80% in the single frontal plane of imaging. This was use a lies as it best profiled the bifurcation between the internal carotid artery and external carotid artery. There was no evidence of intracranial filling defect. Scattered mild diffuse atherosclerotic changes are present. Next, the lesion was crossed with a catheter and guidewire. At this point, my tempted to deployed embolic protection device, however, I could not safely deployed the device in the proximal internal carotid artery secondary to tortuosity and concern for device detachment. Therefore, the proximal stenosis was treated with balloon angioplasty using a 4 mm noncompliant balloon. This was considered the first pass for the purposes of documentation and this was performed at 14:20. Next, a precise 6 mm x 40 mm self expanding stent was deployed in the proximal left internal carotid artery extending into the common carotid artery. Deployment was completed at 14:50 and this was considered recanalization. There was a residual waist and therefore stent dilation was performed using a 5 mm noncompliant balloon. A post treatment arteriogram demonstrated no evidence of significant residual proximal stenosis. There was good apposition of the bare metal stent. There was persistent filling of the left external carotid artery. The intracranial arteries were improved in terms of caliber. There was no evidence of filling defect to suggest peripheral embolus. I elected to terminate the procedure at this point. The revascularization score was graded at TICI-3 Evaluation of the right femoral access site demonstrated at this site was  suitable for closure device. A 6 French Angio-Seal device was deployed without complication. Become beam CT was then performed to evaluate for intracranial hemorrhage and treatment planning. This demonstrated no evidence significant acute intracranial hemorrhage. Fluoroscopic imaging confirmed placement of an enteric tube within the stomach. At this point, 180 mg of Brilinta  was administered per tube. The patient was then transferred to the ICU in stable condition while remaining intubated. IMPRESSION: 1. Emergent left internal carotid artery stent placement due to clinical concern for is global left-sided hypoperfusion and developing stroke in the setting of severe left internal carotid artery stenosis. PLAN: 1. To ICU for routine postoperative supportive care. 2. The patient will continue on a heparin  infusion intravenously at 500 units/hour for the next 6 hours. At that point, heparin  will be discontinued. 3. The patient has received a bolus dose of aspirin  PR and a bolus dose of 180 mg of Brilinta  per tube. Electronically Signed   By: Reagan Camera M.D.   On: 04/23/2024 16:31   CT CEREBRAL PERFUSION W CONTRAST Result Date: 04/23/2024 CLINICAL DATA:  63 year old female code stroke presentation, profound unilateral lower extremity weakness. EXAM: CT  PERFUSION BRAIN TECHNIQUE: Multiphase CT imaging of the brain was performed following IV bolus contrast injection. Subsequent parametric perfusion maps were calculated using RAPID software. RADIATION DOSE REDUCTION: This exam was performed according to the departmental dose-optimization program which includes automated exposure control, adjustment of the mA and/or kV according to patient size and/or use of iterative reconstruction technique. CONTRAST:  50mL OMNIPAQUE  IOHEXOL  350 MG/ML SOLN COMPARISON:  CTA 1207 hours today. FINDINGS: FINDINGS ASPECTS: 10 CBF (<30%) Volume: 0mL. No CBF ir CBV parameter abnormality detected. Perfusion (Tmax>6.0s) volume: 0mL however, 88  mL of T-max greater than 4s is detected which is mostly (although not entirely) in the left hemisphere. Mismatch Volume: Not applicablemL Infarction Location:Not applicable IMPRESSION: 1. NO infarct core or penumbra detected by standard CTP parameters. 2. Suggestion of asymmetric oligemia in the left hemisphere (T-max > 4s), which might relate to the severe ICA stenoses described on CTA today. Electronically Signed   By: Marlise Simpers M.D.   On: 04/23/2024 13:04   CT Angio Chest/Abd/Pel for Dissection W and/or W/WO Result Date: 04/23/2024 CLINICAL DATA:  Weakness and back pain. Acute aortic syndrome suspected. EXAM: CT ANGIOGRAPHY CHEST, ABDOMEN AND PELVIS TECHNIQUE: Non-contrast CT of the chest was initially obtained. Multidetector CT imaging through the chest, abdomen and pelvis was performed using the standard protocol during bolus administration of intravenous contrast. Multiplanar reconstructed images and MIPs were obtained and reviewed to evaluate the vascular anatomy. RADIATION DOSE REDUCTION: This exam was performed according to the departmental dose-optimization program which includes automated exposure control, adjustment of the mA and/or kV according to patient size and/or use of iterative reconstruction technique. CONTRAST:  OMNIPAQUE  IOHEXOL  350 MG/ML SOLN COMPARISON:  CT abdomen pelvis dated 02/09/2022. FINDINGS: Evaluation is limited due to streak artifact caused by patient's arms and overlying support wires. CTA CHEST FINDINGS Cardiovascular: Mild cardiomegaly. No pericardial effusion. There is mild atherosclerotic calcification of the thoracic aorta. No aneurysmal dilatation or dissection. The origins of the great vessels of the aortic arch appear patent. No pulmonary artery embolus identified. Mediastinum/Nodes: No hilar or mediastinal adenopathy. There is circumferential thickening of the mid esophagus concerning for esophagitis. Clinical correlation is recommended. An infiltrative process is  not excluded. No mediastinal fluid collection. Lungs/Pleura: There are minimal bibasilar linear atelectasis. No focal consolidation, pleural effusion, or pneumothorax. The central airways are patent. Musculoskeletal: Median sternotomy wires. No acute osseous pathology. Review of the MIP images confirms the above findings. CTA ABDOMEN AND PELVIS FINDINGS VASCULAR Aorta: Advanced atherosclerotic calcification of the abdominal aorta. No aneurysmal dilatation or dissection. No periaortic fluid collection. Celiac: Atherosclerotic calcification of the origin of the celiac artery. The celiac artery and its major branches are patent. SMA: The SMA is patent. Renals: There is atherosclerotic calcification of the origins of the renal arteries with luminal narrowing. There is diminished flow in the renal arteries bilaterally. The renal arteries however remain patent. IMA: Atherosclerotic calcification of the IMA with diminished flow. Focal area of high-grade narrowing of the proximal IMA with reconstitution of flow distal to this segment. Inflow: Advanced atherosclerotic calcification of the iliac arteries. The iliac arteries are patent. Veins: The IVC is unremarkable.  No portal venous gas. Review of the MIP images confirms the above findings. NON-VASCULAR No intra-abdominal free air.  Small perihepatic ascites. Hepatobiliary: The liver is unremarkable. No biliary dilatation. The gallbladder is unremarkable. Pancreas: Unremarkable. No pancreatic ductal dilatation or surrounding inflammatory changes. Spleen: Normal in size without focal abnormality. Adrenals/Urinary Tract: The adrenal glands are unremarkable. Moderate bilateral  renal parenchyma atrophy. No hydronephrosis. There is stranding of the perinephric and renal sinus fat. Correlation with urinalysis recommended to exclude UTI. The urinary bladder is collapsed. Stomach/Bowel: Mild sigmoid diverticulosis. There is no bowel obstruction or active inflammation. The appendix  is normal. Lymphatic: No adenopathy. Reproductive: The uterus is grossly unremarkable. No suspicious adnexal masses. Other: None Musculoskeletal: No acute or significant osseous findings. Review of the MIP images confirms the above findings. IMPRESSION: 1. No aortic aneurysm or dissection. 2. Circumferential thickening of the mid esophagus concerning for esophagitis. Clinical correlation is recommended. 3. Mild sigmoid diverticulosis. No bowel obstruction. Normal appendix. 4. Moderate bilateral renal parenchyma atrophy. No hydronephrosis. Correlation with urinalysis recommended to exclude UTI. Electronically Signed   By: Angus Bark M.D.   On: 04/23/2024 12:44   CT ANGIO HEAD NECK W WO CM (CODE STROKE) Result Date: 04/23/2024 CLINICAL DATA:  63 year old female code stroke presentation. Possible left MCA syndrome. Dialysis patient. EXAM: CT ANGIOGRAPHY HEAD AND NECK WITH AND WITHOUT CONTRAST TECHNIQUE: Multidetector CT imaging of the head and neck was performed using the standard protocol during bolus administration of intravenous contrast. Multiplanar CT image reconstructions and MIPs were obtained to evaluate the vascular anatomy. Carotid stenosis measurements (when applicable) are obtained utilizing NASCET criteria, using the distal internal carotid diameter as the denominator. RADIATION DOSE REDUCTION: This exam was performed according to the departmental dose-optimization program which includes automated exposure control, adjustment of the mA and/or kV according to patient size and/or use of iterative reconstruction technique. CONTRAST:  50mL OMNIPAQUE  IOHEXOL  350 MG/ML SOLN COMPARISON:  Head CT 1158 hours today. FINDINGS: CTA NECK Skeleton: Reversal of the normal cervical lordosis. Previous sternotomy. Mostly absent dentition. Upper thoracic spina bifida occulta. No acute osseous abnormality identified. Upper chest: Previous CABG. Other neck: Partially retropharyngeal carotids. Nonvascular neck soft  tissue spaces are within normal limits. Aortic arch: Calcified aortic atherosclerosis. Bovine arch configuration. Right carotid system: Brachiocephalic artery and proximal right CCA are patent with mild plaque and no stenosis. Tortuous, partially retropharyngeal right CCA. Calcified plaque at the right ICA origin without significant stenosis. Soft and calcified plaque at the bulb or just distal to the bulb with high-grade stenosis (series 2, image 94 and series 6, image 68) approaching a radiographic string sign, numerically estimated at 70-80 % with respect to the distal vessel. The vessel remains patent to the skull base. Left carotid system: Left CCA origin plaque with less than 50% stenosis. Tortuous left CCA. Soft and calcified plaque before the bifurcation. Severe plaque at the left carotid bifurcation with radiographic string sign stenosis series 5, image 133. But the vessel remains patent. Vertebral arteries: Right subclavian origin calcified plaque without stenosis. Diminutive and atherosclerotic right vertebral artery origin and V1 segment with at least moderate stenosis. Intermittent right V2 segment plaque, but the right vertebral artery caliber improves to the skull base with no additional significant stenosis. Left subclavian origin plaque without stenosis. Calcified plaque at the left vertebral artery origin with similar mild to moderate stenosis. Codominant left vertebral artery with intermittent V2 and V3 segment plaque but no other significant stenosis to the skull base. CTA HEAD Posterior circulation: Atherosclerotic distal vertebral arteries and vertebrobasilar junction. Dominant left V4. PICA origins remain patent. Mild distal vertebral and vertebrobasilar stenosis. Patent basilar artery without stenosis. Patent SCA and PCA origins. Small posterior communicating arteries. Bilateral PCA branches are within normal limits. Anterior circulation:  Both ICA siphons are patent. Severe calcified  atherosclerosis and stenosis of the cavernous left ICA on  series 6, image 102, string sign stenosis. Moderate additional left supraclinoid plaque. The vessel remains patent. Left posterior communicating artery origin is normal. Similar severe calcified right siphon plaque and stenosis. Distal right ICA remains patent with moderate additional supraclinoid plaque and stenosis. Patent carotid termini, MCA and ACA origins. Normal anterior communicating artery. Bilateral ACA branches are within normal limits. Right MCA M1 segment and bifurcation are patent without stenosis. No right MCA branch occlusion is identified. Left MCA M1 segment is patent without stenosis. There is a saccular 2-3 mm left MCA bifurcation aneurysm on series 6, image 112, directed anteriorly and laterally. Left MCA bifurcation remains patent. No left MCA branch occlusion is identified. Venous sinuses: Not evaluated due to early contrast timing. Anatomic variants: Dominant left vertebral artery. Review of the MIP images confirms the above findings IMPRESSION: 1. Negative for large vessel occlusion, But Positive for VERY SEVERE underlying atherosclerosis with: - LICA origin and bilateral ICA siphon RADIOGRAPHIC STRING SIGN STENOSES. - Right ICA distal bulb high-grade stenosis, estimated 70-80% and approaching a radiographic string sign. 2. 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 3. Less pronounced posterior circulation, bilateral vertebral artery atherosclerosis but still evidence of up to moderate bilateral vertebral artery stenoses. 4.  Aortic Atherosclerosis (ICD10-I70.0). Salient findings discussed by telephone with Dr. Doretta Gant on 04/23/2024 at 12:25 . Electronically Signed   By: Marlise Simpers M.D.   On: 04/23/2024 12:27   CT HEAD CODE STROKE WO CONTRAST Result Date: 04/23/2024 CLINICAL DATA:  Code stroke. 63 year old female. Dialysis patient. Pain. EXAM: CT HEAD WITHOUT CONTRAST TECHNIQUE: Contiguous axial images were obtained  from the base of the skull through the vertex without intravenous contrast. RADIATION DOSE REDUCTION: This exam was performed according to the departmental dose-optimization program which includes automated exposure control, adjustment of the mA and/or kV according to patient size and/or use of iterative reconstruction technique. COMPARISON:  Head CT,Brain MRI December 17, 2023. FINDINGS: Brain: Pronounced chronic dural calcification, stable. Stable cerebral volume. No midline shift, ventriculomegaly, mass effect, evidence of mass lesion, intracranial hemorrhage or evidence of cortically based acute infarction. Stable gray-white matter differentiation throughout the brain, relatively normal for age. Vascular: Chronic severe calcification. Skull: Stable visualized osseous structures. Sinuses/Orbits: Severe chronic paranasal sinusitis with mucoperiosteal thickening, chronically eroded posterior septum and sphenoid septum. Stable appearance since December 16, 2024. Tympanic cavities and mastoids remain well aerated. Other: No acute orbit or scalp soft tissue finding. ASPECTS [facility] Stroke Program Early CT Score) Total score (0-10 with 10 being normal): 10 IMPRESSION: 1. Stable noncontrast CT appearance of the brain since 12-16-2024, remarkable for unusually advanced dural calcification. ASPECTS 10. No acute intracranial hemorrhage. 2. Severe chronic paranasal sinus disease. 3. These results were communicated to Dr. Doretta Gant at 12:11 pm on 04/23/2024 by text page via the [place] Hospital messaging system. Electronically Signed   By: Marlise Simpers M.D.   On: 04/23/2024 12:12   PERIPHERAL VASCULAR CATHETERIZATION Result Date: 04/17/2024 See surgical note for result.      HISTORY OF PRESENT ILLNESS 63 y.o. patient with history of end-stage renal disease on dialysis, cerebral aneurysm, diabetes, hypertension, hyperlipidemia, DVT, CAD status post CABG, CHF with EF 35 to 40% and pituitary adenoma status post resection was admitted with acute onset  right-sided weakness and aphasia.  HOSPITAL COURSE Patient was found to have critical stenosis of the left ICA and was taken to interventional radiology for left ICA stenting.  She was placed on aspirin  and ticagrelor , and MRI revealed no acute infarcts.  Acute ischemic  stroke aborted by revascularization of left ICA Etiology: Critical left ICA stenosis Code Stroke CT head No acute abnormality. ASPECTS 10.    CTA head & neck negative for LVO but very severe atherosclerosis with left ICA origin radiographic string sign and bilateral ICA siphon stenosis, right ICA distal bulb high-grade stenosis, 2 to 3 mm left MCA bifurcation saccular aneurysm, moderate bilateral vertebral artery stenosis MRI no acute infarct, chronic dural venous sinus thrombosis with collaterals 2D Echo EF 35 to 40%, grade 2 diastolic dysfunction with akinetic inferior and posterior wall, moderately dilated left atrium, moderate tricuspid regurgitation, no atrial level shunt Vas Carotid US -1 to 39% stenosis in right ICA, 50 to 74% stenosis in the left ICA EEG- This study is within normal limits. No seizures or epileptiform discharges were seen throughout the recording. A normal interictal EEG does not exclude the diagnosis of epilepsy. LDL 75 HgbA1c 8.6 VTE prophylaxis -subcutaneous heparin  aspirin  81 mg daily prior to admission, now on aspirin  81 mg daily and Brilinta  (ticagrelor ) 90 mg bid per interventional radiologist Therapy recommendations: Home health PT Disposition: Home   Bilateral carotid stenosis Severe bilateral carotid stenosis seen on CT angiogram On carotid ultrasound, stenosis in right ICA is 1 to 39%, 50 to 74% in left ICA Stenting of left ICA performed, patient on aspirin  and Brilinta  Carotid ultrasound to evaluate right ICA pending Will explore options for right ICA stenosis with neurovascular radiology as an outpatient 1 month after discharge   Hypertension Home meds: Amlodipine  10 mg daily,  carvedilol  25 mg twice daily Stable Blood Pressure Goal: SBP 120-160 for first 24 hours then less than 180    Hyperlipidemia Home meds: Atorvastatin  80 mg daily, resumed in hospital LDL 75, goal < 70 Add ezetimibe  10 mg daily Continue statin at discharge   Diabetes type II Uncontrolled Home meds: NovoLog  70/30 15 units 3 times daily with meals HgbA1c 8.6, goal < 7.0 CBGs SSI Recommend close follow-up with PCP for better DM control   End-stage renal disease on dialysis Patient is typically dialyzed Monday Wednesday Friday Nephrology consulted for inpatient dialysis     Other Stroke Risk Factors Obesity, Body mass index is 26.44 kg/m., BMI >/= 30 associated with increased stroke risk, recommend weight loss, diet and exercise as appropriate  Coronary artery disease Congestive heart failure  RN Pressure Injury Documentation:     DISCHARGE EXAM  PHYSICAL EXAM General:  Alert, well-nourished, well-developed patient in no acute distress Psych:  Mood and affect appropriate for situation CV: Regular rate and rhythm on monitor Respiratory:  Regular, unlabored respirations on room air GI: Abdomen soft and nontender  NEURO:  Mental Status: AA&Ox3  Speech/Language: speech is without dysarthria or aphasia.  Naming, repetition, fluency, and comprehension intact.  Cranial Nerves:  II: PERRL. Visual fields full.  III, IV, VI: EOMI. Eyelids elevate symmetrically.  V: Sensation is intact to light touch and symmetrical to face.  VII: Smile is symmetrical.  VIII: hearing intact to voice. IX, X: Palate elevates symmetrically. Phonation is normal.  [id number] shrug 5/5. XII: tongue is midline without fasciculations. Motor: 5/5 strength to all muscle groups tested.  Tone: is normal and bulk is normal Sensation- Intact to light touch bilaterally. Extinction absent to light touch to DSS. Coordination: FTN intact bilaterally, HKS: no ataxia in BLE.No drift.  Gait- deferred  1a Level  of Conscious.: *** 1b LOC Questions: *** 1c LOC Commands: *** 2 Best Gaze: *** 3 Visual: *** 4 Facial Palsy: *** 5a Motor Arm -  left: *** 5b Motor Arm - Right: *** 6a Motor Leg - Left: *** 6b Motor Leg - Right: *** 7 Limb Ataxia: *** 8 Sensory: *** 9 Best Language: *** 10 Dysarthria: *** 11 Extinct. and Inatten.: *** TOTAL: ***   Discharge Diet       Diet   Diet Carb Modified Fluid consistency: Thin; Room service appropriate? Yes   liquids  DISCHARGE PLAN Disposition: Home with Home Health aspirin  81 mg daily and Brilinta  (ticagrelor ) 90 mg bid for secondary stroke prevention for 3 months then per interventional radiologist Ongoing stroke risk factor control by Primary Care Physician at time of discharge Follow-up PCP Jimmy Moulding, MD in 2 weeks. Follow up with interventional radiology in 1 month Follow-up in [facility] in 8 weeks, office to schedule an appointment.   *** minutes were spent preparing discharge.  .sign

## 2024-04-27 NOTE — Anesthesia Postprocedure Evaluation (Signed)
 Anesthesia Post Note  Patient: Terry Arroyo  Procedure(s) Performed: RADIOLOGY WITH ANESTHESIA     Patient location during evaluation: SICU Anesthesia Type: General Level of consciousness: sedated Pain management: pain level controlled Vital Signs Assessment: post-procedure vital signs reviewed and stable Respiratory status: patient remains intubated per anesthesia plan Cardiovascular status: stable Postop Assessment: no apparent nausea or vomiting Anesthetic complications: no   No notable events documented.                  Amory Zbikowski

## 2024-04-27 NOTE — Progress Notes (Signed)
 Heart Failure Navigator Progress Note  Assessed for Heart & Vascular TOC clinic readiness.  Patient does not meet criteria due to ESRD on HD.   Navigator will sign off.   Jerilyn Monte, PharmD, BCPS Heart Failure Stewardship Pharmacist Phone [phone]

## 2024-04-27 NOTE — Progress Notes (Signed)
 Contacted by RN CM regarding plans for pt to d/c to home later today or tomorrow. Contacted [facility] and spoke to [employer]. Clinic advised pt will likely d/c later today or tomorrow and should resume at clinic on Monday. Navigator will fax d/c summary and most recent renal note to clinic on Monday morning upon return to the hospital for continuation of care. Team advised that clinic is aware of above plans.   Lauraine Polite Renal Navigator [phone]

## 2024-04-28 ENCOUNTER — Other Ambulatory Visit (HOSPITAL_COMMUNITY): Payer: Self-pay

## 2024-04-28 DIAGNOSIS — I6522 Occlusion and stenosis of left carotid artery: Secondary | ICD-10-CM | POA: Diagnosis not present

## 2024-04-28 DIAGNOSIS — R29700 NIHSS score 0: Secondary | ICD-10-CM | POA: Diagnosis not present

## 2024-04-28 DIAGNOSIS — I639 Cerebral infarction, unspecified: Secondary | ICD-10-CM | POA: Diagnosis not present

## 2024-04-28 LAB — GLUCOSE, CAPILLARY
Glucose-Capillary: 170 mg/dL — ABNORMAL HIGH (ref 70–99)
Glucose-Capillary: 156 mg/dL — ABNORMAL HIGH (ref 70–99)

## 2024-04-28 MED ORDER — EZETIMIBE 10 MG PO TABS
10.0000 mg | ORAL_TABLET | Freq: Every day | ORAL | 2 refills | Status: AC
  Filled 2024-04-28 – 2024-06-28 (×2): qty 30, 30d supply, fill #0
  Filled 2024-07-31: qty 30, 30d supply, fill #1

## 2024-04-28 MED ORDER — TICAGRELOR 90 MG PO TABS
90.0000 mg | ORAL_TABLET | Freq: Two times a day (BID) | ORAL | 2 refills | Status: AC
  Filled 2024-04-28 – 2024-06-28 (×2): qty 60, 30d supply, fill #0
  Filled 2024-08-22: qty 60, 30d supply, fill #1

## 2024-04-28 NOTE — Discharge Instructions (Signed)
 Terry Arroyo, you were admitted with sudden onset right sided weakness and difficulty speaking.  You were found to have a narrowing of your left carotid artery.  This was opened up, and a stent was placed.  Your MRI was negative for stroke.  You will need to take aspirin  daily and Brilinta  twice daily to keep the stent from closing.  You will need to see the interventional radiologist in one month to talk about the narrowing of your right carotid artery, and you will need to follow up in the stroke clinic in 8 weeks.

## 2024-04-28 NOTE — Progress Notes (Signed)
  [facility] Progress Note   Subjective:   Seen in room Talking w/ mild difficulties  Objective Vitals:   04/27/24 1957 04/28/24 0029 04/28/24 0337 04/28/24 0700  BP: (!) 149/67 (!) 142/67 (!) 142/56 (!) 150/64  Pulse: 72 68 66 67  Resp: 18 18 18 19   Temp: 98.2 F (36.8 C) 98.4 F (36.9 C) 98 F (36.7 C) 98.2 F (36.8 C)  TempSrc: Oral Oral Oral Oral  SpO2: 92% 100% 96% 97%  Weight:       Physical Exam Gen: comfortable Neck: supple, no jvd CV:  RRR Abd: soft Lungs: clear ant on RA Extr: no LE edema Neuro:awake, conversant, dysarthria but able to communicate  RUE AVF +t/b,   OP HD: not sure HD place or days Pt lives in [place], [place], prob followed by [facility]   Assessment/Plan **Acute CVA: s/p thrombectomy and L ICA stenting. Per pcp/ stroke team.   **ESRD: on HD MWF. Had HD here on Tues, Thurs and Friday this week. Next HD Monday.    **HTN: BP per stroke team.  Per notes goal BP 120-160 - will set parameters for HD.     **Volume:  2.1 L UF yesterday. Euvolemic on exam, on room air.    **DM type 2: per primary   **Anemia: Hb 10-11s, monitor  **Dyspnea: resolved w/ HD. Cont fluid restriction.  **BMM: not on outpt binders, renal diet.   Phos decent. CTM.    Larry Poag  MD  [facility] 04/28/2024, 8:36 AM  Recent Labs  Lab 04/26/24 0959 04/27/24 1758  HGB 10.3* 10.8*  ALBUMIN  3.4* 3.4*  CALCIUM  8.7* 8.9  PHOS 5.8* 3.5  CREATININE 9.03* 5.83*  K 4.4 4.2    Inpatient medications:  amLODipine   10 mg Oral Daily   aspirin   81 mg Oral Daily   atorvastatin   80 mg Oral Daily   Chlorhexidine  Gluconate Cloth  6 each Topical Q0600   ezetimibe   10 mg Oral Daily   gabapentin   300 mg Oral QHS   heparin  injection (subcutaneous)  5,000 Units Subcutaneous Q8H   insulin  aspart  0-5 Units Subcutaneous QHS   insulin  aspart  0-6 Units Subcutaneous TID WC   mirtazapine   30 mg Oral QHS   ticagrelor   90 mg Oral BID   topiramate   50 mg Oral Daily   traZODone    50 mg Oral QHS    acetaminophen  **OR** acetaminophen  (TYLENOL ) oral liquid 160 mg/5 mL **OR** acetaminophen , albuterol , ipratropium-albuterol , labetalol , ondansetron  (ZOFRAN ) IV, mouth rinse, senna-docusate

## 2024-04-28 NOTE — Plan of Care (Signed)
  Problem: Education: Goal: Knowledge of disease or condition will improve Outcome: Progressing   Problem: Ischemic Stroke/TIA Tissue Perfusion: Goal: Complications of ischemic stroke/TIA will be minimized Outcome: Progressing   Problem: Health Behavior/Discharge Planning: Goal: Goals will be collaboratively established with patient/family Outcome: Progressing   Problem: Self-Care: Goal: Ability to communicate needs accurately will improve Outcome: Progressing   Problem: Education: Goal: Knowledge of General Education information will improve Description: Including pain rating scale, medication(s)/side effects and non-pharmacologic comfort measures Outcome: Progressing   Problem: Clinical Measurements: Goal: Will remain free from infection Outcome: Progressing Goal: Respiratory complications will improve Outcome: Progressing   Problem: Activity: Goal: Risk for activity intolerance will decrease Outcome: Progressing

## 2024-04-29 ENCOUNTER — Inpatient Hospital Stay (HOSPITAL_COMMUNITY)
Admission: EM | Admit: 2024-04-29 | Discharge: 2024-05-03 | DRG: 067 | Disposition: A | Discharge status: Skilled Nursing Facility | Attending: Internal Medicine | Admitting: Internal Medicine

## 2024-04-29 ENCOUNTER — Encounter (HOSPITAL_COMMUNITY): Payer: Self-pay

## 2024-04-29 ENCOUNTER — Emergency Department (HOSPITAL_COMMUNITY)

## 2024-04-29 ENCOUNTER — Other Ambulatory Visit: Payer: Self-pay

## 2024-04-29 DIAGNOSIS — I771 Stricture of artery: Secondary | ICD-10-CM

## 2024-04-29 DIAGNOSIS — I6521 Occlusion and stenosis of right carotid artery: Principal | ICD-10-CM | POA: Diagnosis present

## 2024-04-29 DIAGNOSIS — N189 Chronic kidney disease, unspecified: Secondary | ICD-10-CM | POA: Diagnosis present

## 2024-04-29 DIAGNOSIS — I5020 Unspecified systolic (congestive) heart failure: Secondary | ICD-10-CM | POA: Diagnosis present

## 2024-04-29 DIAGNOSIS — G459 Transient cerebral ischemic attack, unspecified: Secondary | ICD-10-CM | POA: Diagnosis not present

## 2024-04-29 DIAGNOSIS — E1165 Type 2 diabetes mellitus with hyperglycemia: Secondary | ICD-10-CM | POA: Diagnosis present

## 2024-04-29 DIAGNOSIS — Z7982 Long term (current) use of aspirin: Secondary | ICD-10-CM | POA: Diagnosis not present

## 2024-04-29 DIAGNOSIS — Z7902 Long term (current) use of antithrombotics/antiplatelets: Secondary | ICD-10-CM

## 2024-04-29 DIAGNOSIS — Z8249 Family history of ischemic heart disease and other diseases of the circulatory system: Secondary | ICD-10-CM

## 2024-04-29 DIAGNOSIS — N2581 Secondary hyperparathyroidism of renal origin: Secondary | ICD-10-CM | POA: Diagnosis present

## 2024-04-29 DIAGNOSIS — J4489 Other specified chronic obstructive pulmonary disease: Secondary | ICD-10-CM | POA: Diagnosis present

## 2024-04-29 DIAGNOSIS — Z8673 Personal history of transient ischemic attack (TIA), and cerebral infarction without residual deficits: Secondary | ICD-10-CM

## 2024-04-29 DIAGNOSIS — Z992 Dependence on renal dialysis: Secondary | ICD-10-CM

## 2024-04-29 DIAGNOSIS — I10 Essential (primary) hypertension: Secondary | ICD-10-CM | POA: Diagnosis present

## 2024-04-29 DIAGNOSIS — I252 Old myocardial infarction: Secondary | ICD-10-CM

## 2024-04-29 DIAGNOSIS — E1142 Type 2 diabetes mellitus with diabetic polyneuropathy: Secondary | ICD-10-CM | POA: Diagnosis present

## 2024-04-29 DIAGNOSIS — Z86718 Personal history of other venous thrombosis and embolism: Secondary | ICD-10-CM

## 2024-04-29 DIAGNOSIS — Z882 Allergy status to sulfonamides status: Secondary | ICD-10-CM

## 2024-04-29 DIAGNOSIS — N186 End stage renal disease: Secondary | ICD-10-CM | POA: Diagnosis not present

## 2024-04-29 DIAGNOSIS — Z881 Allergy status to other antibiotic agents status: Secondary | ICD-10-CM

## 2024-04-29 DIAGNOSIS — G8194 Hemiplegia, unspecified affecting left nondominant side: Secondary | ICD-10-CM | POA: Diagnosis present

## 2024-04-29 DIAGNOSIS — I2510 Atherosclerotic heart disease of native coronary artery without angina pectoris: Secondary | ICD-10-CM | POA: Diagnosis present

## 2024-04-29 DIAGNOSIS — M25422 Effusion, left elbow: Secondary | ICD-10-CM | POA: Diagnosis present

## 2024-04-29 DIAGNOSIS — Z794 Long term (current) use of insulin: Secondary | ICD-10-CM

## 2024-04-29 DIAGNOSIS — Z951 Presence of aortocoronary bypass graft: Secondary | ICD-10-CM

## 2024-04-29 DIAGNOSIS — Z91048 Other nonmedicinal substance allergy status: Secondary | ICD-10-CM

## 2024-04-29 DIAGNOSIS — F32A Depression, unspecified: Secondary | ICD-10-CM | POA: Diagnosis present

## 2024-04-29 DIAGNOSIS — Z888 Allergy status to other drugs, medicaments and biological substances status: Secondary | ICD-10-CM

## 2024-04-29 DIAGNOSIS — J439 Emphysema, unspecified: Secondary | ICD-10-CM | POA: Diagnosis present

## 2024-04-29 DIAGNOSIS — Z91013 Allergy to seafood: Secondary | ICD-10-CM

## 2024-04-29 DIAGNOSIS — H5462 Unqualified visual loss, left eye, normal vision right eye: Secondary | ICD-10-CM | POA: Diagnosis present

## 2024-04-29 DIAGNOSIS — R29810 Facial weakness: Secondary | ICD-10-CM | POA: Diagnosis present

## 2024-04-29 DIAGNOSIS — F341 Dysthymic disorder: Secondary | ICD-10-CM

## 2024-04-29 DIAGNOSIS — K219 Gastro-esophageal reflux disease without esophagitis: Secondary | ICD-10-CM | POA: Diagnosis present

## 2024-04-29 DIAGNOSIS — R471 Dysarthria and anarthria: Secondary | ICD-10-CM | POA: Diagnosis present

## 2024-04-29 DIAGNOSIS — M549 Dorsalgia, unspecified: Secondary | ICD-10-CM | POA: Diagnosis not present

## 2024-04-29 DIAGNOSIS — Z87891 Personal history of nicotine dependence: Secondary | ICD-10-CM

## 2024-04-29 DIAGNOSIS — E785 Hyperlipidemia, unspecified: Secondary | ICD-10-CM | POA: Diagnosis present

## 2024-04-29 DIAGNOSIS — H543 Unqualified visual loss, both eyes: Secondary | ICD-10-CM

## 2024-04-29 DIAGNOSIS — I5042 Chronic combined systolic (congestive) and diastolic (congestive) heart failure: Secondary | ICD-10-CM | POA: Diagnosis present

## 2024-04-29 DIAGNOSIS — Z883 Allergy status to other anti-infective agents status: Secondary | ICD-10-CM

## 2024-04-29 DIAGNOSIS — T80818A Extravasation of other vesicant agent, initial encounter: Secondary | ICD-10-CM | POA: Diagnosis present

## 2024-04-29 DIAGNOSIS — Z91199 Patient's noncompliance with other medical treatment and regimen due to unspecified reason: Secondary | ICD-10-CM

## 2024-04-29 DIAGNOSIS — S52135A Nondisplaced fracture of neck of left radius, initial encounter for closed fracture: Secondary | ICD-10-CM | POA: Diagnosis present

## 2024-04-29 DIAGNOSIS — H269 Unspecified cataract: Secondary | ICD-10-CM | POA: Diagnosis present

## 2024-04-29 DIAGNOSIS — I132 Hypertensive heart and chronic kidney disease with heart failure and with stage 5 chronic kidney disease, or end stage renal disease: Secondary | ICD-10-CM | POA: Diagnosis present

## 2024-04-29 DIAGNOSIS — Z95828 Presence of other vascular implants and grafts: Secondary | ICD-10-CM

## 2024-04-29 DIAGNOSIS — Z803 Family history of malignant neoplasm of breast: Secondary | ICD-10-CM

## 2024-04-29 DIAGNOSIS — Z8616 Personal history of COVID-19: Secondary | ICD-10-CM

## 2024-04-29 DIAGNOSIS — Z9102 Food additives allergy status: Secondary | ICD-10-CM

## 2024-04-29 DIAGNOSIS — E1169 Type 2 diabetes mellitus with other specified complication: Secondary | ICD-10-CM | POA: Diagnosis present

## 2024-04-29 DIAGNOSIS — E1122 Type 2 diabetes mellitus with diabetic chronic kidney disease: Secondary | ICD-10-CM | POA: Diagnosis present

## 2024-04-29 DIAGNOSIS — G4733 Obstructive sleep apnea (adult) (pediatric): Secondary | ICD-10-CM | POA: Diagnosis present

## 2024-04-29 DIAGNOSIS — D352 Benign neoplasm of pituitary gland: Secondary | ICD-10-CM | POA: Diagnosis present

## 2024-04-29 DIAGNOSIS — D631 Anemia in chronic kidney disease: Secondary | ICD-10-CM | POA: Diagnosis present

## 2024-04-29 DIAGNOSIS — Y842 Radiological procedure and radiotherapy as the cause of abnormal reaction of the patient, or of later complication, without mention of misadventure at the time of the procedure: Secondary | ICD-10-CM | POA: Diagnosis present

## 2024-04-29 DIAGNOSIS — Z87892 Personal history of anaphylaxis: Secondary | ICD-10-CM

## 2024-04-29 DIAGNOSIS — R4781 Slurred speech: Secondary | ICD-10-CM | POA: Diagnosis present

## 2024-04-29 DIAGNOSIS — I671 Cerebral aneurysm, nonruptured: Secondary | ICD-10-CM | POA: Diagnosis present

## 2024-04-29 DIAGNOSIS — H5440 Blindness, one eye, unspecified eye: Secondary | ICD-10-CM | POA: Diagnosis present

## 2024-04-29 DIAGNOSIS — D353 Benign neoplasm of craniopharyngeal duct: Secondary | ICD-10-CM

## 2024-04-29 DIAGNOSIS — Z79899 Other long term (current) drug therapy: Secondary | ICD-10-CM

## 2024-04-29 HISTORY — DX: Acute pulmonary edema: J81.0

## 2024-04-29 HISTORY — DX: Chronic kidney disease, stage 4 (severe): N17.9

## 2024-04-29 HISTORY — DX: Acute kidney failure, unspecified: N18.4

## 2024-04-29 LAB — DIFFERENTIAL
Abs Immature Granulocytes: 0.05 10*3/uL (ref 0.00–0.07)
Basophils Absolute: 0.1 10*3/uL (ref 0.0–0.1)
Basophils Relative: 1 %
Eosinophils Absolute: 0.4 10*3/uL (ref 0.0–0.5)
Eosinophils Relative: 4 %
Immature Granulocytes: 1 %
Lymphocytes Relative: 14 %
Lymphs Abs: 1.4 10*3/uL (ref 0.7–4.0)
Monocytes Absolute: 0.8 10*3/uL (ref 0.1–1.0)
Monocytes Relative: 8 %
Neutro Abs: 7.3 10*3/uL (ref 1.7–7.7)
Neutrophils Relative %: 72 %

## 2024-04-29 LAB — CBC
HCT: 33.2 % — ABNORMAL LOW (ref 36.0–46.0)
Hemoglobin: 10.2 g/dL — ABNORMAL LOW (ref 12.0–15.0)
MCH: 29.2 pg (ref 26.0–34.0)
MCHC: 30.7 g/dL (ref 30.0–36.0)
MCV: 95.1 fL (ref 80.0–100.0)
Platelets: 267 10*3/uL (ref 150–400)
RBC: 3.49 MIL/uL — ABNORMAL LOW (ref 3.87–5.11)
RDW: 14.2 % (ref 11.5–15.5)
WBC: 10 10*3/uL (ref 4.0–10.5)
nRBC: 0 % (ref 0.0–0.2)

## 2024-04-29 LAB — I-STAT CHEM 8, ED
BUN: 51 mg/dL — ABNORMAL HIGH (ref 8–23)
Calcium, Ion: 1.01 mmol/L — ABNORMAL LOW (ref 1.15–1.40)
Chloride: 99 mmol/L (ref 98–111)
Creatinine, Ser: 8.1 mg/dL — ABNORMAL HIGH (ref 0.44–1.00)
Glucose, Bld: 159 mg/dL — ABNORMAL HIGH (ref 70–99)
HCT: 31 % — ABNORMAL LOW (ref 36.0–46.0)
Hemoglobin: 10.5 g/dL — ABNORMAL LOW (ref 12.0–15.0)
Potassium: 4.6 mmol/L (ref 3.5–5.1)
Sodium: 134 mmol/L — ABNORMAL LOW (ref 135–145)
TCO2: 23 mmol/L (ref 22–32)

## 2024-04-29 LAB — COMPREHENSIVE METABOLIC PANEL WITH GFR
ALT: 10 U/L (ref 0–44)
AST: 12 U/L — ABNORMAL LOW (ref 15–41)
Albumin: 3.4 g/dL — ABNORMAL LOW (ref 3.5–5.0)
Alkaline Phosphatase: 79 U/L (ref 38–126)
Anion gap: 15 (ref 5–15)
BUN: 56 mg/dL — ABNORMAL HIGH (ref 8–23)
CO2: 24 mmol/L (ref 22–32)
Calcium: 8.9 mg/dL (ref 8.9–10.3)
Chloride: 96 mmol/L — ABNORMAL LOW (ref 98–111)
Creatinine, Ser: 8.05 mg/dL — ABNORMAL HIGH (ref 0.44–1.00)
GFR, Estimated: 5 mL/min — ABNORMAL LOW (ref >60)
Glucose, Bld: 160 mg/dL — ABNORMAL HIGH (ref 70–99)
Potassium: 4.6 mmol/L (ref 3.5–5.1)
Sodium: 135 mmol/L (ref 135–145)
Total Bilirubin: 0.9 mg/dL (ref 0.0–1.2)
Total Protein: 7.3 g/dL (ref 6.5–8.1)

## 2024-04-29 LAB — GLUCOSE, CAPILLARY: Glucose-Capillary: 88 mg/dL (ref 70–99)

## 2024-04-29 LAB — PROTIME-INR
INR: 1.1 (ref 0.8–1.2)
Prothrombin Time: 14.7 s (ref 11.4–15.2)

## 2024-04-29 LAB — APTT: aPTT: 32 s (ref 24–36)

## 2024-04-29 LAB — ETHANOL: Alcohol, Ethyl (B): <15 mg/dL (ref <15)

## 2024-04-29 LAB — CBG MONITORING, ED
Glucose-Capillary: 191 mg/dL — ABNORMAL HIGH (ref 70–99)
Glucose-Capillary: 173 mg/dL — ABNORMAL HIGH (ref 70–99)

## 2024-04-29 MED ORDER — INSULIN ASPART 100 UNIT/ML IJ SOLN
0.0000 [IU] | Freq: Three times a day (TID) | INTRAMUSCULAR | Status: DC
  Administered 2024-04-29: 3 [IU] via SUBCUTANEOUS
  Administered 2024-04-30: 5 [IU] via SUBCUTANEOUS
  Administered 2024-04-30 – 2024-05-01 (×2): 3 [IU] via SUBCUTANEOUS
  Administered 2024-05-02 – 2024-05-03 (×2): 2 [IU] via SUBCUTANEOUS

## 2024-04-29 MED ORDER — HEPARIN SODIUM (PORCINE) 5000 UNIT/ML IJ SOLN
5000.0000 [IU] | Freq: Three times a day (TID) | INTRAMUSCULAR | Status: DC
  Administered 2024-04-29 – 2024-05-03 (×9): 5000 [IU] via SUBCUTANEOUS
  Filled 2024-04-29 (×9): qty 1

## 2024-04-29 MED ORDER — ASPIRIN 81 MG PO TBEC
81.0000 mg | DELAYED_RELEASE_TABLET | Freq: Every day | ORAL | Status: DC
  Administered 2024-04-30: 81 mg via ORAL
  Filled 2024-04-29: qty 1

## 2024-04-29 MED ORDER — ACETAMINOPHEN 325 MG PO TABS
650.0000 mg | ORAL_TABLET | ORAL | Status: DC | PRN
  Administered 2024-04-29 – 2024-04-30 (×3): 650 mg via ORAL
  Filled 2024-04-29 (×3): qty 2

## 2024-04-29 MED ORDER — HYDROMORPHONE HCL 1 MG/ML IJ SOLN
0.5000 mg | INTRAMUSCULAR | Status: AC | PRN
  Administered 2024-04-29 – 2024-04-30 (×2): 0.5 mg via INTRAVENOUS
  Filled 2024-04-29 (×2): qty 0.5

## 2024-04-29 MED ORDER — HYDROMORPHONE HCL 1 MG/ML IJ SOLN
1.0000 mg | Freq: Once | INTRAMUSCULAR | Status: AC
  Administered 2024-04-29: 1 mg via INTRAMUSCULAR
  Filled 2024-04-29: qty 1

## 2024-04-29 MED ORDER — TICAGRELOR 90 MG PO TABS
90.0000 mg | ORAL_TABLET | Freq: Two times a day (BID) | ORAL | Status: DC
  Administered 2024-04-29 – 2024-04-30 (×3): 90 mg via ORAL
  Filled 2024-04-29 (×3): qty 1

## 2024-04-29 MED ORDER — STROKE: EARLY STAGES OF RECOVERY BOOK
Freq: Once | Status: AC
  Filled 2024-04-29: qty 1

## 2024-04-29 MED ORDER — ALBUTEROL SULFATE (2.5 MG/3ML) 0.083% IN NEBU: 3.0000 mL | INHALATION_SOLUTION | Freq: Four times a day (QID) | RESPIRATORY_TRACT | Status: DC | PRN

## 2024-04-29 MED ORDER — ATORVASTATIN CALCIUM 80 MG PO TABS
80.0000 mg | ORAL_TABLET | Freq: Every day | ORAL | Status: DC
  Administered 2024-04-29 – 2024-05-02 (×4): 80 mg via ORAL
  Filled 2024-04-29 (×4): qty 1

## 2024-04-29 MED ORDER — SODIUM CHLORIDE 0.9% FLUSH
3.0000 mL | Freq: Once | INTRAVENOUS | Status: AC
  Administered 2024-04-29: 3 mL via INTRAVENOUS

## 2024-04-29 MED ORDER — ACETAMINOPHEN 160 MG/5ML PO SOLN: 650.0000 mg | ORAL | Status: DC | PRN

## 2024-04-29 MED ORDER — CHLORHEXIDINE GLUCONATE CLOTH 2 % EX PADS
6.0000 | MEDICATED_PAD | Freq: Every day | CUTANEOUS | Status: DC
Start: 2024-04-30 — End: 2024-05-03
  Administered 2024-04-30 – 2024-05-03 (×3): 6 via TOPICAL

## 2024-04-29 MED ORDER — TRAZODONE HCL 50 MG PO TABS
25.0000 mg | ORAL_TABLET | Freq: Every day | ORAL | Status: DC
  Administered 2024-04-29 – 2024-05-02 (×4): 25 mg via ORAL
  Filled 2024-04-29 (×4): qty 1

## 2024-04-29 MED ORDER — MIRTAZAPINE 15 MG PO TABS
30.0000 mg | ORAL_TABLET | Freq: Every day | ORAL | Status: DC
  Administered 2024-04-29 – 2024-05-02 (×4): 30 mg via ORAL
  Filled 2024-04-29 (×4): qty 2

## 2024-04-29 MED ORDER — SENNOSIDES-DOCUSATE SODIUM 8.6-50 MG PO TABS: 1.0000 | ORAL_TABLET | Freq: Every evening | ORAL | Status: DC | PRN

## 2024-04-29 MED ORDER — IOHEXOL 350 MG/ML SOLN: 100.0000 mL | Freq: Once | INTRAVENOUS | Status: DC | PRN

## 2024-04-29 MED ORDER — ACETAMINOPHEN 650 MG RE SUPP: 650.0000 mg | RECTAL | Status: DC | PRN

## 2024-04-29 NOTE — Consult Note (Signed)
 Renal Service Consult Note [facility]  Terry Arroyo 04/29/2024 Terry Sandifer, MD Requesting Physician: Dr. Rufina Cough  Reason for Consult: ESRD patient with recurrent stroke-like symptoms HPI: The patient is a 63 y.o. year-old w/ PMH as below who just discharged charged yesterday from a previous stroke that she had 2 days ago.  That stroke was on the right side and was treated with a left ICA stent placement.  Today patient was having new symptoms on the left side also felt tired and weak with some slurred speech.  In the ED her symptoms were improving.  Vital signs showed blood pressure 130/80, heart rate in the 50s to 60s.  CT head was negative, MRI brain showed nothing acute.  MRA of the head and neck showed showed similar carotid artery disease with greater than 70% stenosis on the right side, and status post intervention on the left.  Patient was seen by neurology.  They said the patient was discharged after left ICA stenosis leading to right sided MCA ischemia treated with stenting and resolution of symptoms.  Now she was here with possible right carotid artery disease symptoms.  Symptoms improved in the ED.  Home aspirin , Brilinta  and atorvastatin  were to be continued.  Renal team is asked to see for dialysis.   Pt seen in room.  No complaints at this time.   ROS - denies CP, no joint pain, no HA, no blurry vision, no rash, no diarrhea, no nausea/ vomiting  PMH: Anemia Anxiety Asthma Blind left eye COPD Depression ESRD on HD HFrEF  History of CABG Hyperlipidemia HTN Sepsis Sleep apnea History of stroke Diabetes type 2  Past Surgical History  Past Surgical History:  Procedure Laterality Date   A/V FISTULAGRAM Right 12/29/2021   Procedure: A/V FISTULAGRAM;  Surgeon: Jackquelyn Mass, MD;  Location: [facility] INVASIVE CV LAB;  Service: Cardiovascular;  Laterality: Right;   A/V FISTULAGRAM Right 06/15/2022   Procedure: A/V Fistulagram;  Surgeon: Jackquelyn Mass, MD;  Location: [facility] INVASIVE CV LAB;  Service: Cardiovascular;  Laterality: Right;   A/V FISTULAGRAM Right 04/17/2024   Procedure: A/V Fistulagram;  Surgeon: Jackquelyn Mass, MD;  Location: [facility] INVASIVE CV LAB;  Service: Cardiovascular;  Laterality: Right;   AV FISTULA PLACEMENT Right 01/28/2021   Procedure: ARTERIOVENOUS (AV) FISTULA CREATION;  Surgeon: Jackquelyn Mass, MD;  Location: [facility] ORS;  Service: Vascular;  Laterality: Right;   AV FISTULA PLACEMENT Right 05/13/2021   Procedure: INSERTION OF ARTERIOVENOUS (AV) GORE-TEX GRAFT ARM ( BRACHIAL AXILLARY );  Surgeon: Jackquelyn Mass, MD;  Location: [facility] ORS;  Service: Vascular;  Laterality: Right;   BRAIN SURGERY  1986   pituitary tumor   BREAST EXCISIONAL BIOPSY Left yrs ago   benign   CESAREAN SECTION     COLONOSCOPY N/A 05/26/2023   Procedure: COLONOSCOPY;  Surgeon: Quintin Buckle, DO;  Location: [facility] ENDOSCOPY;  Service: Gastroenterology;  Laterality: N/A;  DIALYSIS   CORONARY ARTERY BYPASS GRAFT  2018   x 4 vessels; performed in [place]    DIALYSIS/PERMA CATHETER INSERTION N/A 11/07/2020   Procedure: DIALYSIS/PERMA CATHETER INSERTION;  Surgeon: Celso College, MD;  Location: [facility] INVASIVE CV LAB;  Service: Cardiovascular;  Laterality: N/A;   DIALYSIS/PERMA CATHETER REMOVAL N/A 06/12/2021   Procedure: DIALYSIS/PERMA CATHETER REMOVAL;  Surgeon: Celso College, MD;  Location: [facility] INVASIVE CV LAB;  Service: Cardiovascular;  Laterality: N/A;   ESOPHAGOGASTRODUODENOSCOPY (EGD) WITH PROPOFOL  N/A 12/22/2021   Procedure: ESOPHAGOGASTRODUODENOSCOPY (EGD) WITH PROPOFOL ;  Surgeon: Shane Darling, MD;  Location: [facility] ENDOSCOPY;  Service: Gastroenterology;  Laterality: N/A;  IDDM   EYE SURGERY Right 2021   cataracts    IR CT HEAD LTD  04/23/2024   IR PERCUTANEOUS ART THROMBECTOMY/INFUSION INTRACRANIAL INC DIAG ANGIO  04/23/2024   IR US  GUIDE VASC ACCESS RIGHT  04/23/2024   PERIPHERAL VASCULAR THROMBECTOMY Right 04/03/2021    Procedure: A/V Fistulagram ;  Surgeon: Jackquelyn Mass, MD;  Location: [facility] INVASIVE CV LAB;  Service: Cardiovascular;  Laterality: Right;   RADIOLOGY WITH ANESTHESIA N/A 04/23/2024   Procedure: RADIOLOGY WITH ANESTHESIA;  Surgeon: Radiologist, Medication, MD;  Location: [facility] OR;  Service: Radiology;  Laterality: N/A;   Family History  Family History  Problem Relation Age of Onset   CAD Mother    CAD Brother    Breast cancer Sister 52   Social History  reports that she quit smoking about 7 years ago. Her smoking use included cigarettes. She has never used smokeless tobacco. She reports that she does not currently use alcohol . She reports that she does not currently use drugs. Allergies  Allergies  Allergen Reactions   Hydralazine  Hives, Shortness Of Breath, Swelling and Rash    Body aches, also    Kiwi Extract Hives, Swelling, Rash and Other (See Comments)    Mouth swells    Lisinopril  Swelling and Other (See Comments)    Angioedema   Shellfish Allergy Anaphylaxis and Other (See Comments)    Shrimp/lobster    Betadine [Povidone Iodine ] Hives and Rash   Septra [Sulfamethoxazole-Trimethoprim] Other (See Comments)    Reaction not stated   Metformin Diarrhea and Other (See Comments)    GI Intolerance   Tape Itching    Use paper tape whenever possible   Home medications Prior to Admission medications   Medication Sig Start Date End Date Taking? Authorizing Provider  acetaminophen  (TYLENOL ) 500 MG tablet Take 1,000 mg by mouth every 6 (six) hours as needed for mild pain (pain score 1-3) or moderate pain (pain score 4-6).   Yes [provider]  albuterol  (VENTOLIN  HFA) 108 (90 Base) MCG/ACT inhaler Inhale 2 puffs into the lungs every 6 (six) hours as needed for wheezing or shortness of breath.   Yes [provider]  amLODipine  (NORVASC ) 10 MG tablet Take 1 tablet (10 mg total) by mouth daily. 03/03/20  Yes Luna Salinas, MD  aspirin  81 MG EC tablet Take 1 tablet (81 mg  total) by mouth daily. 02/10/21  Yes Brenna Cam, MD  atorvastatin  (LIPITOR ) 80 MG tablet Take 1 tablet (80 mg total) by mouth daily at 6 PM. 11/26/19  Yes Patel, Pranav M, MD  B Complex-C-Folic Acid  (RENA-VITE RX) 1 MG TABS Take 1 tablet by mouth daily. 03/17/23  Yes [provider]  carvedilol  (COREG ) 25 MG tablet Take 25 mg by mouth 2 (two) times daily.   Yes [provider]  gabapentin  (NEURONTIN ) 300 MG capsule Take 300 mg by mouth at bedtime.   Yes [provider]  lidocaine -prilocaine  (EMLA ) cream Apply 1 Application topically every Monday, Wednesday, and Friday with hemodialysis.   Yes [provider]  mirtazapine  (REMERON ) 30 MG tablet Take 30 mg by mouth at bedtime.   Yes [provider]  NOVOLOG  MIX 70/30 FLEXPEN (70-30) 100 UNIT/ML FlexPen Inject 7-15 Units into the skin 3 (three) times daily with meals.   Yes [provider]  topiramate  (TOPAMAX ) 50 MG tablet Take 50 mg by mouth daily. 11/16/23  Yes [provider]  traZODone  (DESYREL ) 50 MG tablet Take 0.5 tablets (25 mg total) by mouth at bedtime. Patient taking differently: Take 50 mg by mouth at bedtime. 04/16/22  Yes Ree Candy, MD  vitamin B-12 (CYANOCOBALAMIN ) 1000 MCG tablet Take 1,000 mcg by mouth daily.   Yes [provider]  ezetimibe  (ZETIA ) 10 MG tablet Take 1 tablet (10 mg total) by mouth daily. Patient not taking: Reported on 04/29/2024 04/29/24   de Thayne Fine, Cortney E, NP  ticagrelor  (BRILINTA ) 90 MG TABS tablet Take 1 tablet (90 mg total) by mouth 2 (two) times daily. Patient not taking: Reported on 04/29/2024 04/28/24   de Thayne Fine, Margart Shears E, NP     Vitals:   04/29/24 1700 04/29/24 1715 04/29/24 1741 04/29/24 1828  BP: (!) 129/58 (!) 135/59  121/74  Pulse: (!) 52 (!) 52  (!) 52  Resp: 16 13    Temp:   97.7 F (36.5 C) 98.2 F (36.8 C)  TempSrc:   Oral Oral  SpO2: 94% 98%  92%  Weight:      Height:       Exam Gen alert, no  distress, on room air No rash, cyanosis or gangrene Sclera anicteric, throat clear  No jvd or bruits Chest clear bilat to bases, no rales/ wheezing RRR no MRG Abd soft ntnd no mass or ascites +bs GU defer MS left arm is painful and tender to touch Ext no LE or UE edema Neuro is alert, Ox 3 , L arm weakness    RUE AVF +bruit    Renal-related home meds: Avastin 10 mg daily Coreg  25 twice daily Rena-Vite multivitamin 1 daily Others: Brilinta , trazodone , Remeron , Neurontin , Lipitor , aspirin , Zetia , Topamax , NovoLog  FlexPen   OP HD: not sure HD place or days Pt lives in [place], [place], prob followed by [facility]     Assessment/Plan # TIA/ recent CVA/ carotid artery disease:  s/p recent thrombectomy and L ICA stenting. Now pt has new L arm weakness and pain. May need R ICA work done. Per neuro/ pmd.    **ESRD: on HD MWF. HD Monday.    **HTN: permissive HTN (goal syst < 220, diast < 120).    **Volume: looks euvolemic on exam, on room air.    **DM type 2: per primary   **Anemia: Hb 10-11s, monitor   **BMM: not on outpt binders, renal diet.   Phos decent. CTM.   Larry Poag  MD  [facility] 04/29/2024, 9:51 PM  Recent Labs  Lab 04/26/24 0959 04/27/24 1758 04/29/24 1255 04/29/24 1302  HGB 10.3* 10.8* 10.2* 10.5*  ALBUMIN  3.4* 3.4* 3.4*  --   CALCIUM  8.7* 8.9 8.9  --   PHOS 5.8* 3.5  --   --   CREATININE 9.03* 5.83* 8.05* 8.10*  K 4.4 4.2 4.6 4.6    Inpatient medications:  [START ON 04/30/2024]  stroke: early stages of recovery book   Does not apply Once   [START ON 04/30/2024] aspirin  EC  81 mg Oral Daily   atorvastatin   80 mg Oral q1800   heparin   5,000 Units Subcutaneous Q8H   insulin  aspart  0-15 Units Subcutaneous TID WC   mirtazapine   30 mg Oral QHS   ticagrelor   90 mg Oral BID   traZODone   25 mg Oral QHS    acetaminophen  **OR** acetaminophen  (TYLENOL ) oral liquid 160 mg/5 mL **OR** acetaminophen , albuterol , HYDROmorphone  (DILAUDID ) injection, iohexol ,  senna-docusate

## 2024-04-29 NOTE — Code Documentation (Signed)
 Stroke Response Nurse Documentation Code Documentation  Terry Arroyo is a 63 y.o. female arriving to [facility]  via [facility] as Code Stroke activation. Discharged from [facility] 5/10 with stroke s/p IR with stenting. History of ESRD- HD MWF, CAD, CHF, CABG, MI. No anticoagulation. LKW 0930, home health RN present at the time. Patient just performed exercises when she began to feel tired and weak, then noted slurred speech and left sided weakness. EMS was dispatched.   Stroke team met patient at bridge, labs unable to be collected due to no access and difficult stick. Cleared for CT by EDP Martina Sledge, pt taken to CT. Initial NIH 10, see flowsheet for details. Subsequent exam NIH 3. CT obtained. CTA/CTP ordered though delay in obtaining IV access with multiple attempts through use of ultrasound. After IV infiltration, decision made to go to MRI/MRA. Patient not candidate for TNK as symptoms rapidly improving. No thrombectomy as imaging negative, too mild to treat. Care Plan: q2h NIH and vitals. Bedside handoff with ED RN Alaina/Aryssa.    Ronney Cola K  Rapid Response RN

## 2024-04-29 NOTE — H&P (Signed)
 History and Physical   Terry Arroyo VQQ:[id number] DOB: [birth year] DOA: 04/29/2024  PCP: Jimmy Moulding, MD   Patient coming from: Home  Chief Complaint: Neuro Deficit , code stroke  HPI: Terry Arroyo is a 63 y.o. female with medical history significant of diabetes, neuropathy, hypertension, hyperlipidemia, GERD, stroke, CAD status post CABG, anemia, chronic combined systolic and diastolic CHF, ESRD on HD, left eye blindness, brain aneurysm, benign pituitary neoplasm, DVT, depression, cataract, COPD, OSA presenting with focal neurologic deficit.  Of note, patient was recently admitted 5/5-5/10 on neurology service for acute ischemic MCA  secondary to left ICA stenosis treated with stenting and DAPT.  MRI following intervention did not show infarct so the stroke was aborted by the intervention.  Today she was walking and she began to feel tired and weak.  She then noticed some slurred speech and left-sided weakness as well as some left arm pain.  Presented to the ED as a code stroke.  Symptoms have been improving in the ED.  Denies fevers, chills, chest pain, shortness of breath, abdominal pain, constipation, diarrhea, nausea, vomiting.  ED Course: Vital signs in the ED notable for blood pressure in the 110s-160s systolic, heart rate in the 50s-60s.  Lab workup included CMP with chloride 96, BUN 26, creatinine stable at 8.5, glucose 160, albumin  3.4.  CBC with hemoglobin stable at 10.2.  PT, PTT, INR within normal limits.  Ethanol level negative.  CT head showed no acute abnormality.  MRI brain showed some motion degradation but no acute abnormality.  MRA of the head and neck showed similar carotid artery disease with greater than 70% stenosis on the right side and status post intervention on the left.  Also noted was stable aneurysm.  Left elbow x-ray showed questionable radial neck fracture with moderate elbow effusion and possible evidence of extravasation of contrast at the soft tissue  near the ulna.  Patient received Dilaudid  in the ED.  Neurology was consulted and evaluated the patient.  Possible TIA/symptomatic right ICA stenosis.  With modified TIA workup recommended given recent workup for stroke.  Review of Systems: As per HPI otherwise all other systems reviewed and are negative.  Past Medical History:  Diagnosis Date   ACS (acute coronary syndrome) (HCC) 03/24/2019   Acute ischemic left MCA stroke (HCC) 04/23/2024   Acute on chronic systolic CHF (congestive heart failure) (HCC) 02/26/2020   Acute pain of left thigh 02/09/2022   Acute pulmonary edema (HCC)    Acute renal failure superimposed on stage 4 chronic kidney disease (HCC)    Anasarca 02/26/2020   Anemia    Angina at rest [facility])    Angioedema 03/20/2021   Lisinopril    Anterior cerebral aneurysm    approximately 5 mm; left MCA    Anxiety    Aortic atherosclerosis (HCC)    Asthma    Benign hypertensive kidney disease with chronic kidney disease 03/10/2020   Blind left eye    CHF (congestive heart failure) (HCC)    COPD (chronic obstructive pulmonary disease) (HCC)    COPD exacerbation (HCC) 10/10/2020   Coronary artery disease    COVID-19 virus infection 03/24/2019   Depression    DOE (dyspnea on exertion)    DVT (deep venous thrombosis) (HCC) 12/30/2016   Left leg dvt as per pt while she was in hospital for cabg  Is on eliquis since august  Doppler done in [place]  Shows   acute  occlusive deep vein  thrombosis  noted in  the soleal  vein (a short  segment) at the  mid calf  3/18 doppler is negative     Last Assessment & Plan:   Will rpt doppler   If negative will stop eliquis  Formatting of this note might be different from the original.  Left leg dv   ESRD (end stage renal disease) (HCC)    Gait instability    Genital herpes    GERD (gastroesophageal reflux disease)    HFrEF (heart failure with reduced ejection fraction) (HCC)    History of 2019 novel coronavirus disease (COVID-19) 03/2019    [facility] [place]    History of DVT (deep vein thrombosis)    History of DVT (deep vein thrombosis)    Hx of CABG 2018   x 4; performed in [place]    Hyperlipidemia    Hypertension    Hypertensive urgency 11/24/2019   MI (myocardial infarction) (HCC) 2018   no stents   Pituitary adenoma (HCC)    PVD (posterior vitreous detachment), right eye    Secondary hyperparathyroidism (HCC)    Sepsis (HCC) 09/05/2021   Sleep apnea    non-compliant with nocturnal PAP therapy   Stroke [facility]) 2014   stated affected left side.   T2DM (type 2 diabetes mellitus) [facility])     Past Surgical History:  Procedure Laterality Date   A/V FISTULAGRAM Right 12/29/2021   Procedure: A/V FISTULAGRAM;  Surgeon: Jackquelyn Mass, MD;  Location: [facility] INVASIVE CV LAB;  Service: Cardiovascular;  Laterality: Right;   A/V FISTULAGRAM Right 06/15/2022   Procedure: A/V Fistulagram;  Surgeon: Jackquelyn Mass, MD;  Location: [facility] INVASIVE CV LAB;  Service: Cardiovascular;  Laterality: Right;   A/V FISTULAGRAM Right 04/17/2024   Procedure: A/V Fistulagram;  Surgeon: Jackquelyn Mass, MD;  Location: [facility] INVASIVE CV LAB;  Service: Cardiovascular;  Laterality: Right;   AV FISTULA PLACEMENT Right 01/28/2021   Procedure: ARTERIOVENOUS (AV) FISTULA CREATION;  Surgeon: Jackquelyn Mass, MD;  Location: [facility] ORS;  Service: Vascular;  Laterality: Right;   AV FISTULA PLACEMENT Right 05/13/2021   Procedure: INSERTION OF ARTERIOVENOUS (AV) GORE-TEX GRAFT ARM ( BRACHIAL AXILLARY );  Surgeon: Jackquelyn Mass, MD;  Location: [facility] ORS;  Service: Vascular;  Laterality: Right;   BRAIN SURGERY  1986   pituitary tumor   BREAST EXCISIONAL BIOPSY Left yrs ago   benign   CESAREAN SECTION     COLONOSCOPY N/A 05/26/2023   Procedure: COLONOSCOPY;  Surgeon: Quintin Buckle, DO;  Location: [facility] ENDOSCOPY;  Service: Gastroenterology;  Laterality: N/A;  DIALYSIS   CORONARY ARTERY BYPASS GRAFT  2018   x 4 vessels; performed  in [place]    DIALYSIS/PERMA CATHETER INSERTION N/A 11/07/2020   Procedure: DIALYSIS/PERMA CATHETER INSERTION;  Surgeon: Celso College, MD;  Location: [facility] INVASIVE CV LAB;  Service: Cardiovascular;  Laterality: N/A;   DIALYSIS/PERMA CATHETER REMOVAL N/A 06/12/2021   Procedure: DIALYSIS/PERMA CATHETER REMOVAL;  Surgeon: Celso College, MD;  Location: [facility] INVASIVE CV LAB;  Service: Cardiovascular;  Laterality: N/A;   ESOPHAGOGASTRODUODENOSCOPY (EGD) WITH PROPOFOL  N/A 12/22/2021   Procedure: ESOPHAGOGASTRODUODENOSCOPY (EGD) WITH PROPOFOL ;  Surgeon: Shane Darling, MD;  Location: [facility] ENDOSCOPY;  Service: Gastroenterology;  Laterality: N/A;  IDDM   EYE SURGERY Right 2021   cataracts    IR CT HEAD LTD  04/23/2024   IR PERCUTANEOUS ART THROMBECTOMY/INFUSION INTRACRANIAL INC DIAG ANGIO  04/23/2024   IR US  GUIDE VASC ACCESS RIGHT  04/23/2024   PERIPHERAL VASCULAR THROMBECTOMY Right  04/03/2021   Procedure: A/V Fistulagram ;  Surgeon: Jackquelyn Mass, MD;  Location: [facility] INVASIVE CV LAB;  Service: Cardiovascular;  Laterality: Right;   RADIOLOGY WITH ANESTHESIA N/A 04/23/2024   Procedure: RADIOLOGY WITH ANESTHESIA;  Surgeon: Radiologist, Medication, MD;  Location: [facility] OR;  Service: Radiology;  Laterality: N/A;    Social History  reports that she quit smoking about 7 years ago. Her smoking use included cigarettes. She has never used smokeless tobacco. She reports that she does not currently use alcohol . She reports that she does not currently use drugs.  Allergies  Allergen Reactions   Hydralazine  Hives, Shortness Of Breath, Swelling and Rash    Body aches, also    Kiwi Extract Hives, Swelling, Rash and Other (See Comments)    Mouth swells    Lisinopril  Swelling and Other (See Comments)    Angioedema   Shellfish Allergy Anaphylaxis and Other (See Comments)    Shrimp/lobster    Betadine [Povidone Iodine ] Hives and Rash   Septra [Sulfamethoxazole-Trimethoprim] Other (See Comments)    Reaction not  stated   Metformin Diarrhea and Other (See Comments)    GI Intolerance   Tape Itching    Use paper tape whenever possible    Family History  Problem Relation Age of Onset   CAD Mother    CAD Brother    Breast cancer Sister 52  Reviewed on admission  Prior to Admission medications   Medication Sig Start Date End Date Taking? Authorizing Provider  acetaminophen  (TYLENOL ) 500 MG tablet Take 1,000 mg by mouth every 6 (six) hours as needed for mild pain (pain score 1-3) or moderate pain (pain score 4-6).   Yes [provider]  albuterol  (VENTOLIN  HFA) 108 (90 Base) MCG/ACT inhaler Inhale 2 puffs into the lungs every 6 (six) hours as needed for wheezing or shortness of breath.   Yes [provider]  amLODipine  (NORVASC ) 10 MG tablet Take 1 tablet (10 mg total) by mouth daily. 03/03/20  Yes Luna Salinas, MD  aspirin  81 MG EC tablet Take 1 tablet (81 mg total) by mouth daily. 02/10/21  Yes Brenna Cam, MD  atorvastatin  (LIPITOR ) 80 MG tablet Take 1 tablet (80 mg total) by mouth daily at 6 PM. 11/26/19  Yes Patel, Pranav M, MD  B Complex-C-Folic Acid  (RENA-VITE RX) 1 MG TABS Take 1 tablet by mouth daily. 03/17/23  Yes [provider]  carvedilol  (COREG ) 25 MG tablet Take 25 mg by mouth 2 (two) times daily.   Yes [provider]  gabapentin  (NEURONTIN ) 300 MG capsule Take 300 mg by mouth at bedtime.   Yes [provider]  lidocaine -prilocaine  (EMLA ) cream Apply 1 Application topically every Monday, Wednesday, and Friday with hemodialysis.   Yes [provider]  mirtazapine  (REMERON ) 30 MG tablet Take 30 mg by mouth at bedtime.   Yes [provider]  NOVOLOG  MIX 70/30 FLEXPEN (70-30) 100 UNIT/ML FlexPen Inject 7-15 Units into the skin 3 (three) times daily with meals.   Yes [provider]  topiramate  (TOPAMAX ) 50 MG tablet Take 50 mg by mouth daily. 11/16/23  Yes [provider]  traZODone  (DESYREL ) 50 MG tablet Take 0.5  tablets (25 mg total) by mouth at bedtime. Patient taking differently: Take 50 mg by mouth at bedtime. 04/16/22  Yes Ree Candy, MD  vitamin B-12 (CYANOCOBALAMIN ) 1000 MCG tablet Take 1,000 mcg by mouth daily.   Yes [provider]  ezetimibe  (ZETIA ) 10 MG tablet Take 1 tablet (10  mg total) by mouth daily. Patient not taking: Reported on 04/29/2024 04/29/24   de Thayne Fine, Cortney E, NP  ticagrelor  (BRILINTA ) 90 MG TABS tablet Take 1 tablet (90 mg total) by mouth 2 (two) times daily. Patient not taking: Reported on 04/29/2024 04/28/24   de Greer Leak, NP    Physical Exam: Vitals:   04/29/24 1315 04/29/24 1330 04/29/24 1400 04/29/24 1430  BP:  (!) 122/59 124/60 (!) 129/56  Pulse: 62 60 61 60  Resp: (!) 21 14 17 20   Temp:      TempSrc:      SpO2: 90% 93% 93% 95%  Weight:      Height:        Physical Exam Constitutional:      General: She is not in acute distress.    Appearance: Normal appearance.  HENT:     Head: Normocephalic and atraumatic.     Mouth/Throat:     Mouth: Mucous membranes are moist.     Pharynx: Oropharynx is clear.  Eyes:     Extraocular Movements: Extraocular movements intact.     Pupils: Pupils are equal, round, and reactive to light.  Cardiovascular:     Rate and Rhythm: Normal rate and regular rhythm.     Pulses: Normal pulses.     Heart sounds: Normal heart sounds.  Pulmonary:     Effort: Pulmonary effort is normal. No respiratory distress.     Breath sounds: Normal breath sounds.  Abdominal:     General: Bowel sounds are normal. There is no distension.     Palpations: Abdomen is soft.     Tenderness: There is no abdominal tenderness.  Musculoskeletal:        General: No swelling or deformity.  Skin:    General: Skin is warm and dry.  Neurological:     Comments: Mental Status: Patient is awake, alert, oriented x3 No signs of aphasia or neglect Cranial Nerves: II: Pupils equal, round, and reactive to light.   III,IV, VI:  EOMI without ptosis or diploplia.  V: Facial sensation is symmetric to light touch. VII: Facial movement is symmetric.  VIII: hearing is intact to voice X: Uvula elevates symmetrically XI: Shoulder shrug is symmetric. XII: tongue is midline without atrophy or fasciculations.  Motor: Good effort thorughout, but LUE limited by elbow pain. At [facility] 5/5 bilateral UE (Only Grip tested on LUE), 5/5 bilateral lower extremitiy  Sensory: Sensation is grossly intact bilateral UEs & LEs    Labs on Admission: I have personally reviewed following labs and imaging studies  CBC: Recent Labs  Lab 04/23/24 1230 04/24/24 0616 04/25/24 0529 04/26/24 0959 04/27/24 1758 04/29/24 1255 04/29/24 1302  WBC 8.7 9.6 8.3 8.2 7.8 10.0  --   NEUTROABS 6.3  --   --   --   --  7.3  --   HGB 11.9* 11.2* 10.5* 10.3* 10.8* 10.2* 10.5*  HCT 37.6 34.1* 32.1* 31.3* 33.2* 33.2* 31.0*  MCV 93.1 90.7 88.9 89.7 91.0 95.1  --   PLT 201 186 172 190 240 267  --     Basic Metabolic Panel: Recent Labs  Lab 04/24/24 0616 04/25/24 0529 04/26/24 0959 04/27/24 1758 04/29/24 1255 04/29/24 1302  NA 134* 133* 132* 135 135 134*  K 4.8 3.9 4.4 4.2 4.6 4.6  CL 100 97* 95* 95* 96* 99  CO2 18* 22 22 25 24   --   GLUCOSE 73 143* 176* 139* 160* 159*  BUN 75* 43* 62*  36* 56* 51*  CREATININE 10.29* 7.09* 9.03* 5.83* 8.05* 8.10*  CALCIUM  8.1* 8.6* 8.7* 8.9 8.9  --   MG  --  1.8  --   --   --   --   PHOS  --   --  5.8* 3.5  --   --     GFR: Estimated Creatinine Clearance: 7.7 mL/min (A) (by C-G formula based on SCr of 8.1 mg/dL (H)).  Liver Function Tests: Recent Labs  Lab 04/23/24 1107 04/24/24 0616 04/26/24 0959 04/27/24 1758 04/29/24 1255  AST 12* 11*  --   --  12*  ALT 8 9  --   --  10  ALKPHOS 83 74  --   --  79  BILITOT 1.5* 0.9  --   --  0.9  PROT 8.2* 7.2  --   --  7.3  ALBUMIN  4.1 3.4* 3.4* 3.4* 3.4*    Urine analysis:    Component Value Date/Time   COLORURINE YELLOW (A) 11/04/2021 0035    APPEARANCEUR HAZY (A) 11/04/2021 0035   LABSPEC 1.016 11/04/2021 0035   PHURINE 7.0 11/04/2021 0035   GLUCOSEU 150 (A) 11/04/2021 0035   HGBUR SMALL (A) 11/04/2021 0035   BILIRUBINUR NEGATIVE 11/04/2021 0035   KETONESUR NEGATIVE 11/04/2021 0035   PROTEINUR >=300 (A) 11/04/2021 0035   NITRITE NEGATIVE 11/04/2021 0035   LEUKOCYTESUR SMALL (A) 11/04/2021 0035    Radiological Exams on Admission: DG Elbow 2 Views Left Result Date: 04/29/2024 CLINICAL DATA:  Pain elbow pain EXAM: LEFT ELBOW - 2 VIEW COMPARISON:  CTA chest, abdomen, and pelvis dated 04/23/2024 FINDINGS: Ill-defined density dispersed through the subcutaneous soft tissues of the partially imaged upper arm overlying the ulnar aspect of the humerus. No acute dislocation. Subtle cortical angulation at the level of the radial neck. Moderate elbow joint effusion. IMPRESSION: 1. Subtle cortical angulation at the level of the radial neck, which may represent a nondisplaced fracture in the appropriate clinical setting. 2. Moderate elbow joint effusion. 3. Ill-defined density dispersed through the subcutaneous soft tissues of the partially imaged upper arm overlying the ulnar aspect of the humerus, which may represent extravasated contrast material. The patient underwent a contrast-enhanced CT examination on 04/23/2024 with contrast instillation via the left upper extremity. Electronically Signed   By: Limin  Xu M.D.   On: 04/29/2024 13:22   MR BRAIN WO CONTRAST Result Date: 04/29/2024 CLINICAL DATA:  Neuro deficit, acute, stroke suspected EXAM: MRI HEAD WITHOUT CONTRAST MRA HEAD WITHOUT CONTRAST MRA NECK WITHOUT CONTRAST TECHNIQUE: Multiplanar, multiecho pulse sequences of the brain and surrounding structures were obtained without intravenous contrast. Angiographic images of the Circle of Willis were obtained using MRA technique without intravenous contrast. Angiographic images of the neck were obtained using MRA technique without intravenous  contrast. Carotid stenosis measurements (when applicable) are obtained utilizing NASCET criteria, using the distal internal carotid diameter as the denominator. COMPARISON:  CTA 04/23/2024, MRI head 12/07/2023 FINDINGS: MRI HEAD Motion artifact. Brain: There is no acute infarction or intracranial hemorrhage. There is no intracranial mass, mass effect, or edema. There is no hydrocephalus or extra-axial fluid collection. Ventricles and sulci are stable in size and configuration. Patchy foci of T2 hyperintensity in the supratentorial white matter are nonspecific but may reflect stable mild chronic microvascular ischemic changes. Vascular: Major vessel flow voids at the skull base are preserved. Skull and upper cervical spine: Normal marrow signal is preserved. Sinuses/Orbits: Postoperative changes with minimal patchy mucosal thickening. Right lens replacement. Other: Evidence of prior sellar  mass resection minimal patchy mastoid fluid opacification. Retention cysts along the posterior wall of the nasopharynx. MRA HEAD Motion artifact. Intracranial internal carotid arteries are patent. Atherosclerotic irregularity with stenosis. There is up to marked stenosis of the right cavernous ICA and moderate marked stenosis of the left paraclinoid ICA, likely similar to prior CTA. Middle and anterior cerebral arteries are patent. 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 Intracranial vertebral arteries, basilar artery, posterior cerebral arteries are patent. Bilateral posterior communicating arteries are present. No significant stenosis. MRA NECK Motion artifact. Right common, internal, and external carotid arteries are patent. There is plaque without hemodynamically significant stenosis at the ICA origin. Subsequent circumferential narrowing of the proximal ICA causing greater than 70% stenosis. Likely no significant change from the prior CTA. Left common carotid artery is patent. There is loss of flow at the distal  CCA and proximal ICA that may be related to interval stent placement. Mid to distal cervical ICA is patent. Vertebral arteries are patent. There is limited evaluation proximally due to artifact. No significant stenosis identified. IMPRESSION: Degraded by motion. No acute infarction, hemorrhage, or mass. No significant change since the prior brain MRI. Loss of flow related enhancement of the distal CCA and proximal ICA is probably related to artifact from interval stent. Greater than 70% stenosis of the proximal right ICA is probably similar to the prior CTA. Right greater than left intracranial ICA atherosclerosis and stenosis is likely similar to the prior CTA. 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 Electronically Signed   By: Geannie Keener M.D.   On: 04/29/2024 12:38   MR ANGIO HEAD WO CONTRAST Result Date: 04/29/2024 CLINICAL DATA:  Neuro deficit, acute, stroke suspected EXAM: MRI HEAD WITHOUT CONTRAST MRA HEAD WITHOUT CONTRAST MRA NECK WITHOUT CONTRAST TECHNIQUE: Multiplanar, multiecho pulse sequences of the brain and surrounding structures were obtained without intravenous contrast. Angiographic images of the Circle of Willis were obtained using MRA technique without intravenous contrast. Angiographic images of the neck were obtained using MRA technique without intravenous contrast. Carotid stenosis measurements (when applicable) are obtained utilizing NASCET criteria, using the distal internal carotid diameter as the denominator. COMPARISON:  CTA 04/23/2024, MRI head 12/07/2023 FINDINGS: MRI HEAD Motion artifact. Brain: There is no acute infarction or intracranial hemorrhage. There is no intracranial mass, mass effect, or edema. There is no hydrocephalus or extra-axial fluid collection. Ventricles and sulci are stable in size and configuration. Patchy foci of T2 hyperintensity in the supratentorial white matter are nonspecific but may reflect stable mild chronic microvascular  ischemic changes. Vascular: Major vessel flow voids at the skull base are preserved. Skull and upper cervical spine: Normal marrow signal is preserved. Sinuses/Orbits: Postoperative changes with minimal patchy mucosal thickening. Right lens replacement. Other: Evidence of prior sellar mass resection minimal patchy mastoid fluid opacification. Retention cysts along the posterior wall of the nasopharynx. MRA HEAD Motion artifact. Intracranial internal carotid arteries are patent. Atherosclerotic irregularity with stenosis. There is up to marked stenosis of the right cavernous ICA and moderate marked stenosis of the left paraclinoid ICA, likely similar to prior CTA. Middle and anterior cerebral arteries are patent. 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 Intracranial vertebral arteries, basilar artery, posterior cerebral arteries are patent. Bilateral posterior communicating arteries are present. No significant stenosis. MRA NECK Motion artifact. Right common, internal, and external carotid arteries are patent. There is plaque without hemodynamically significant stenosis at the ICA origin. Subsequent circumferential narrowing of the proximal ICA causing greater than 70%  stenosis. Likely no significant change from the prior CTA. Left common carotid artery is patent. There is loss of flow at the distal CCA and proximal ICA that may be related to interval stent placement. Mid to distal cervical ICA is patent. Vertebral arteries are patent. There is limited evaluation proximally due to artifact. No significant stenosis identified. IMPRESSION: Degraded by motion. No acute infarction, hemorrhage, or mass. No significant change since the prior brain MRI. Loss of flow related enhancement of the distal CCA and proximal ICA is probably related to artifact from interval stent. Greater than 70% stenosis of the proximal right ICA is probably similar to the prior CTA. Right greater than left intracranial ICA  atherosclerosis and stenosis is likely similar to the prior CTA. 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 Electronically Signed   By: Geannie Keener M.D.   On: 04/29/2024 12:38   MR ANGIO NECK WO CONTRAST Result Date: 04/29/2024 CLINICAL DATA:  Neuro deficit, acute, stroke suspected EXAM: MRI HEAD WITHOUT CONTRAST MRA HEAD WITHOUT CONTRAST MRA NECK WITHOUT CONTRAST TECHNIQUE: Multiplanar, multiecho pulse sequences of the brain and surrounding structures were obtained without intravenous contrast. Angiographic images of the Circle of Willis were obtained using MRA technique without intravenous contrast. Angiographic images of the neck were obtained using MRA technique without intravenous contrast. Carotid stenosis measurements (when applicable) are obtained utilizing NASCET criteria, using the distal internal carotid diameter as the denominator. COMPARISON:  CTA 04/23/2024, MRI head 12/07/2023 FINDINGS: MRI HEAD Motion artifact. Brain: There is no acute infarction or intracranial hemorrhage. There is no intracranial mass, mass effect, or edema. There is no hydrocephalus or extra-axial fluid collection. Ventricles and sulci are stable in size and configuration. Patchy foci of T2 hyperintensity in the supratentorial white matter are nonspecific but may reflect stable mild chronic microvascular ischemic changes. Vascular: Major vessel flow voids at the skull base are preserved. Skull and upper cervical spine: Normal marrow signal is preserved. Sinuses/Orbits: Postoperative changes with minimal patchy mucosal thickening. Right lens replacement. Other: Evidence of prior sellar mass resection minimal patchy mastoid fluid opacification. Retention cysts along the posterior wall of the nasopharynx. MRA HEAD Motion artifact. Intracranial internal carotid arteries are patent. Atherosclerotic irregularity with stenosis. There is up to marked stenosis of the right cavernous ICA and moderate marked  stenosis of the left paraclinoid ICA, likely similar to prior CTA. Middle and anterior cerebral arteries are patent. 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 Intracranial vertebral arteries, basilar artery, posterior cerebral arteries are patent. Bilateral posterior communicating arteries are present. No significant stenosis. MRA NECK Motion artifact. Right common, internal, and external carotid arteries are patent. There is plaque without hemodynamically significant stenosis at the ICA origin. Subsequent circumferential narrowing of the proximal ICA causing greater than 70% stenosis. Likely no significant change from the prior CTA. Left common carotid artery is patent. There is loss of flow at the distal CCA and proximal ICA that may be related to interval stent placement. Mid to distal cervical ICA is patent. Vertebral arteries are patent. There is limited evaluation proximally due to artifact. No significant stenosis identified. IMPRESSION: Degraded by motion. No acute infarction, hemorrhage, or mass. No significant change since the prior brain MRI. Loss of flow related enhancement of the distal CCA and proximal ICA is probably related to artifact from interval stent. Greater than 70% stenosis of the proximal right ICA is probably similar to the prior CTA. Right greater than left intracranial ICA atherosclerosis and stenosis is likely similar  to the prior CTA. 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 Electronically Signed   By: Geannie Keener M.D.   On: 04/29/2024 12:38   CT HEAD CODE STROKE WO CONTRAST Result Date: 04/29/2024 CLINICAL DATA:  Code stroke.  Left-sided weakness EXAM: CT HEAD WITHOUT CONTRAST TECHNIQUE: Contiguous axial images were obtained from the base of the skull through the vertex without intravenous contrast. RADIATION DOSE REDUCTION: This exam was performed according to the departmental dose-optimization program which includes automated exposure control,  adjustment of the mA and/or kV according to patient size and/or use of iterative reconstruction technique. COMPARISON:  Six days prior FINDINGS: Brain: No evidence of acute infarction, hemorrhage, hydrocephalus, extra-axial collection or mass lesion/mass effect. Extensive dural calcification Vascular: No hyperdense vessel. Scattered areas of calcification within the superior sagittal and left transverse sigmoid dural sinuses. Skull: Normal. Negative for fracture or focal lesion. Sinuses/Orbits: Chronic sinusitis with prior endoscopic sinus surgery Other: Prelim sent in epic chat ASPECTS [facility] Stroke Program Early CT Score) - Ganglionic level infarction (caudate, lentiform nuclei, internal capsule, insula, M1-M3 cortex): 7 - Supraganglionic infarction (M4-M6 cortex): 3 Total score (0-10 with 10 being normal): 10 IMPRESSION: No acute or interval finding. Electronically Signed   By: Ronnette Coke M.D.   On: 04/29/2024 11:08   EKG: Not performed in the emergency department.  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 > Was walking today and noted weakness, tiredness, slurred speech, left arm weakness and left arm pain.  > No acute infarct on imaging but concern for possible symptomatic right carotid artery disease symptoms questionable radial neck fracture stable greater than 70% stenosis on the right. > Symptoms improving in the ED.  Neurology following recommending modified TIA workup given recent workup. - Monitor on telemetry overnight - Appreciate neurology recommendations and assistance - Allow for permissive HTN (systolic < 220 and diastolic < 120)  - Continue home aspirin , Brilinta , atorvastatin  - Carotid Doppler - Tele monitoring  - SLP eval - PT/OT - Every 2 hours neurochecks, neurology to be notified if recurrence of significant deficits for neurointerventional radiology intervention overnight. - Otherwise patient will need vascular surgery consult in the morning for evaluation for intervention on her carotid artery.  Left elbow pain > Effusion on x-ray with questionable radial neck fracture and possible extravasation of prior contrast material. - Sling ordered in the ED - May need ortho input prior to discharge vs referral on discharge.  ESRD > HD MWF - Consult nephrology for dialysis tomorrow  Hypertension -  Permissive hypertension as above  Hyperlipidemia - Continue atorvastatin  as above  CAD > Status post CABG - On ASA, Brilinta  as above - Continue home atorvastatin   Anemia > Hemoglobin stable at 10.2. - Trend CBC  Chronic combined systolic and diastolic CHF > Last echo was 4 days ago with EF 35-40%, G2 DD, mild reduced RV function.  MR, TR. > Volume managed with HD - Continue with dialysis - Holding carvedilol   DM - SSI  History of brain aneurysm - Stable on imaging today  Depression - Continue mirtazapine  and trazodone   COPD - Continue home  albuterol   OSA - Continue home CPAP  History of cataracts History of DVT Left eye blindness - Noted  DVT prophylaxis: Heparin  Code Status:   Full Family Communication:  None on admission  Disposition Plan:   Patient is from:  Home  Anticipated DC to:  Home  Anticipated DC date:  1 to 3 days  Anticipated DC barriers: None  Consults called:  Neurology, nephrology Admission status:  Observation, telemetry  Severity of Illness: The appropriate patient status for this patient is OBSERVATION. Observation status is judged to be reasonable and necessary in order to provide the required intensity of service to ensure the patient's safety. The patient's presenting symptoms, physical exam findings, and initial radiographic and laboratory data in the context of their medical condition is felt to place them at decreased risk for further clinical deterioration. Furthermore, it is anticipated that the patient will be medically stable for discharge from the hospital within 2 midnights of admission.    Johnetta Nab MD [facility]  How to contact the [facility] Attending or Consulting provider 7A - 7P or covering provider during after hours 7P -7A, for this patient?   Check the care team in [facility] and look for a) attending/consulting [facility] provider listed and b) the [facility] team listed Log into [email or web address] and use [place]'s universal password to access. If you do not have the password, please contact the hospital operator. Locate the [facility] provider you are looking for under [facility] and page to a number that you can be directly reached. If you still have difficulty reaching the provider, please page the [facility] (Director on Call) for the Hospitalists listed on amion for assistance.  04/29/2024, 3:15 PM

## 2024-04-29 NOTE — ED Notes (Signed)
 PT states she had a [place]/black purse that her home nurse gave to EMS when she was picked up. PT did not have a purse when she arrived to ER. [place] was contacted and see if purse was left on ambulance. Message was left with communications who will contact the truck that transported pt to ED and will call to advise if belongings were left.

## 2024-04-29 NOTE — ED Notes (Signed)
 Assumed care of pt from [facility]

## 2024-04-29 NOTE — ED Provider Notes (Signed)
 [facility] Provider Note  CSN: [id number] Arrival date & time: 04/29/24 1048  Chief Complaint(s) Code Stroke  HPI Terry Arroyo is a 63 y.o. female with past medical history as below, significant for ACA CHF, COPD, CAD, depression, HFrEF, CABG x 4, ESRD MWF, CVA, left ICA stent placement earlier this month who presents to the ED with complaint of code stroke  Code stroke was activated in the field secondary to left-sided weakness and slurred speech.  Patient was evaluated upon arrival with neurology, ear was clear, sent to CT for evaluation.  Patient was walking prior to arrival, she felt tired and weak and had some slurred speech and left-sided weakness, left arm pain.  She is a poor historian secondary to speech difficulties  Past Medical History Past Medical History:  Diagnosis Date   Anemia    Angina at rest [facility])    Angioedema 03/20/2021   Lisinopril    Anterior cerebral aneurysm    approximately 5 mm; left MCA    Anxiety    Aortic atherosclerosis (HCC)    Asthma    Blind left eye    CHF (congestive heart failure) (HCC)    COPD (chronic obstructive pulmonary disease) (HCC)    Coronary artery disease    Depression    DOE (dyspnea on exertion)    ESRD (end stage renal disease) (HCC)    Gait instability    Genital herpes    GERD (gastroesophageal reflux disease)    HFrEF (heart failure with reduced ejection fraction) (HCC)    History of 2019 novel coronavirus disease (COVID-19) 03/2019   [facility] [place]    History of DVT (deep vein thrombosis)    History of DVT (deep vein thrombosis)    Hx of CABG 2018   x 4; performed in [place]    Hyperlipidemia    Hypertension    MI (myocardial infarction) (HCC) 2018   no stents   Pituitary adenoma (HCC)    PVD (posterior vitreous detachment), right eye    Secondary hyperparathyroidism (HCC)    Sleep apnea    non-compliant with nocturnal PAP therapy   Stroke  [facility]) 2014   stated affected left side.   T2DM (type 2 diabetes mellitus) (HCC)    Patient Active Problem List   Diagnosis Date Noted   Carotid stenosis, bilateral 04/24/2024   Stroke (HCC) 04/23/2024   Acute ischemic left MCA stroke (HCC) 04/23/2024   Ambulatory dysfunction 12/08/2023   Gait instability 12/07/2023   Sinus bradycardia 12/07/2023   Shortness of breath 05/02/2023   Weakness    Elevated BUN    Nausea and vomiting 04/14/2022   Generalized weakness 04/14/2022   Dyslipidemia 04/14/2022   GERD without esophagitis 04/14/2022   Type 2 diabetes mellitus without complications (HCC) 04/14/2022   Uncontrolled type 2 diabetes mellitus with hyperglycemia, with long-term current use of insulin  (HCC) 04/14/2022   Physical debility 02/13/2022   Acute pain of left thigh 02/09/2022   Cellulitis of left thigh 02/09/2022   Atherosclerosis of native arteries of extremity with intermittent claudication (HCC) 02/04/2022   Leg pain, anterior, left 02/04/2022   Angioedema 10/28/2021   Lactic acidosis    Diabetic polyneuropathy associated with type 2 diabetes mellitus (HCC)    Sepsis (HCC) 09/05/2021   Acute CHF (congestive heart failure) (HCC) 09/05/2021   Fluid overload 06/11/2021   Hypokalemia 06/11/2021   Acute pulmonary edema (HCC)    COPD (chronic obstructive pulmonary disease) with emphysema (  HCC) 02/09/2021   Chronic sphenoidal sinusitis 01/01/2021   Contact dermatitis and other eczema due to other specified agent 01/01/2021   Hydronephrosis, left 01/01/2021   Other diseases of nasal cavity and sinuses(478.19) 01/01/2021   End-stage renal disease on hemodialysis (HCC)    Reactive thrombocytosis 11/08/2020   Nephrotic syndrome 11/04/2020   SOB (shortness of breath) 11/01/2020   Acute on chronic combined systolic and diastolic CHF (congestive heart failure) (HCC) 10/21/2020   CAD (coronary artery disease) 10/21/2020   Hyperlipidemia associated with type 2 diabetes mellitus  (HCC) 10/21/2020   HFrEF (heart failure with reduced ejection fraction) (HCC)    Acute respiratory distress    COPD exacerbation (HCC) 10/10/2020   Kidney hematoma 09/13/2020   Essential hypertension    Acute renal failure superimposed on stage 4 chronic kidney disease (HCC)    Hematuria 09/12/2020   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) 09/03/2020   Blind left eye 09/03/2020   Brain aneurysm 09/03/2020   Esotropia 09/03/2020   Lesion of ulnar nerve 09/03/2020   Mixed hyperlipidemia 09/03/2020   Sensory hearing loss, bilateral 09/03/2020   Tear film insufficiency 09/03/2020   Health maintenance examination 06/27/2020   Secondary hyperparathyroidism of renal origin (HCC) 04/24/2020   Anemia in chronic kidney disease 03/10/2020   Benign hypertensive kidney disease with chronic kidney disease 03/10/2020   Hyposmolality and/or hyponatremia 03/10/2020   Stage 3b chronic kidney disease (HCC) 03/10/2020   Calf tenderness    Leg edema    Type 2 diabetes mellitus with hyperlipidemia (HCC) 02/26/2020   CKD (chronic kidney disease) stage 4, GFR 15-29 ml/min (HCC) 02/26/2020   Hyponatremia 02/26/2020   Anasarca 02/26/2020   Proteinuria 02/26/2020   Hypoalbuminemia 02/26/2020   Elevated troponin 02/26/2020   Acute on chronic systolic CHF (congestive heart failure) (HCC) 02/26/2020   Aortic atherosclerosis (HCC) 02/18/2020   Hyperglycemia    Nausea    Hypertensive emergency    Myalgia due to statin 12/04/2019   Hypertensive urgency 11/24/2019   Chest pain 11/23/2019   OSA (obstructive sleep apnea) 04/10/2019   Mild episode of recurrent major depressive disorder (HCC) 04/10/2019   ACS (acute coronary syndrome) (HCC) 03/24/2019   COVID-19 virus infection 03/24/2019   Hypertensive heart/kidney disease w/chronic kidney disease stage III (HCC) 10/21/2017   Mild nonproliferative diabetic retinopathy of right eye associated with type 2 diabetes mellitus (HCC)  09/09/2017   Genital herpes 05/07/2017   DVT (deep venous thrombosis) (HCC) 12/30/2016   History of DVT (deep vein thrombosis) 12/30/2016   Hx of CABG 2018   PVD (posterior vitreous detachment), right eye 01/09/2016   Diabetic peripheral angiopathy (HCC) 01/03/2016   Age-related nuclear cataract of both eyes 10/24/2015   Chronic nonintractable headache 10/02/2015   Dry eyes, bilateral 02/13/2015   Proptosis 12/18/2014   Iritis of right eye 12/03/2014   Anemia 08/30/2014   Depression 08/30/2014   Home Medication(s) Prior to Admission medications   Medication Sig Start Date End Date Taking? Authorizing Provider  acetaminophen  (TYLENOL ) 500 MG tablet Take 1,000 mg by mouth every 6 (six) hours as needed for mild pain (pain score 1-3) or moderate pain (pain score 4-6).    [provider]  albuterol  (VENTOLIN  HFA) 108 (90 Base) MCG/ACT inhaler Inhale 2 puffs into the lungs every 6 (six) hours as needed for wheezing or shortness of breath.    [provider]  amLODipine  (NORVASC ) 10 MG tablet Take 1 tablet (10 mg total) by mouth daily. 03/03/20  Luna Salinas, MD  aspirin  81 MG EC tablet Take 1 tablet (81 mg total) by mouth daily. 02/10/21   Brenna Cam, MD  atorvastatin  (LIPITOR ) 80 MG tablet Take 1 tablet (80 mg total) by mouth daily at 6 PM. 11/26/19   Patel, Pranav M, MD  B Complex-C-Folic Acid  (RENA-VITE RX) 1 MG TABS Take 1 tablet by mouth daily. 03/17/23   [provider]  carvedilol  (COREG ) 25 MG tablet Take 25 mg by mouth 2 (two) times daily.    [provider]  ezetimibe  (ZETIA ) 10 MG tablet Take 1 tablet (10 mg total) by mouth daily. 04/29/24   de Thayne Fine, Cortney E, NP  gabapentin  (NEURONTIN ) 300 MG capsule Take 300 mg by mouth at bedtime.    [provider]  lidocaine -prilocaine  (EMLA ) cream Apply 1 Application topically every Monday, Wednesday, and Friday with hemodialysis.    [provider]  mirtazapine  (REMERON ) 30 MG tablet  Take 30 mg by mouth at bedtime.    [provider]  NOVOLOG  MIX 70/30 FLEXPEN (70-30) 100 UNIT/ML FlexPen Inject 15 Units into the skin 3 (three) times daily with meals.    [provider]  ticagrelor  (BRILINTA ) 90 MG TABS tablet Take 1 tablet (90 mg total) by mouth 2 (two) times daily. 04/28/24   de Thayne Fine, Cortney E, NP  topiramate  (TOPAMAX ) 50 MG tablet Take 50 mg by mouth daily. 11/16/23   [provider]  traZODone  (DESYREL ) 50 MG tablet Take 0.5 tablets (25 mg total) by mouth at bedtime. Patient taking differently: Take 50 mg by mouth at bedtime. 04/16/22   Ree Candy, MD  vitamin B-12 (CYANOCOBALAMIN ) 1000 MCG tablet Take 1,000 mcg by mouth daily.    [provider]                                                                                                                                    Past Surgical History Past Surgical History:  Procedure Laterality Date   A/V FISTULAGRAM Right 12/29/2021   Procedure: A/V FISTULAGRAM;  Surgeon: Jackquelyn Mass, MD;  Location: [facility];  Service: Cardiovascular;  Laterality: Right;   A/V FISTULAGRAM Right 06/15/2022   Procedure: A/V Fistulagram;  Surgeon: Jackquelyn Mass, MD;  Location: [facility];  Service: Cardiovascular;  Laterality: Right;   A/V FISTULAGRAM Right 04/17/2024   Procedure: A/V Fistulagram;  Surgeon: Jackquelyn Mass, MD;  Location: [facility];  Service: Cardiovascular;  Laterality: Right;   AV FISTULA PLACEMENT Right 01/28/2021   Procedure: ARTERIOVENOUS (AV) FISTULA CREATION;  Surgeon: Jackquelyn Mass, MD;  Location: [facility] OR[facility];  Service: Vascular;  Laterality: Right;   AV FISTULA PLACEMENT Right 05/13/2021   Procedure: INSERTION OF ARTERIOVENOUS (AV) GORE-TEX GRAFT ARM ( BRACHIAL AXILLARY );  Surgeon: Jackquelyn Mass, MD;  Location: [facility] OR[facility];  Service: Vascular;  Laterality: Right;   BRAIN SURGERY  1986   pituitary tumor   BREAST EXCISIONAL  BIOPSY Left yrs ago   benign   CESAREAN SECTION     COLONOSCOPY N/A 05/26/2023   Procedure: COLONOSCOPY;  Surgeon: Quintin Buckle, DO;  Location: [facility];  Service: Gastroenterology;  Laterality: N/A;  DIALYSIS   CORONARY ARTERY BYPASS GRAFT  2018   x 4 vessels; performed in [place]    DIALYSIS/PERMA CATHETER INSERTION N/A 11/07/2020   Procedure: DIALYSIS/PERMA CATHETER INSERTION;  Surgeon: Celso College, MD;  Location: [facility];  Service: Cardiovascular;  Laterality: N/A;   DIALYSIS/PERMA CATHETER REMOVAL N/A 06/12/2021   Procedure: DIALYSIS/PERMA CATHETER REMOVAL;  Surgeon: Celso College, MD;  Location: [facility];  Service: Cardiovascular;  Laterality: N/A;   ESOPHAGOGASTRODUODENOSCOPY (EGD) WITH PROPOFOL  N/A 12/22/2021   Procedure: ESOPHAGOGASTRODUODENOSCOPY (EGD) WITH PROPOFOL ;  Surgeon: Shane Darling, MD;  Location: [facility] ENDOSCOPY;  Service: Gastroenterology;  Laterality: N/A;  IDDM   EYE SURGERY Right 2021   cataracts    IR CT HEAD LTD  04/23/2024   IR PERCUTANEOUS ART THROMBECTOMY/INFUSION INTRACRANIAL INC DIAG ANGIO  04/23/2024   IR US  GUIDE VASC ACCESS RIGHT  04/23/2024   PERIPHERAL VASCULAR THROMBECTOMY Right 04/03/2021   Procedure: A/V Fistulagram ;  Surgeon: Jackquelyn Mass, MD;  Location: [facility];  Service: Cardiovascular;  Laterality: Right;   RADIOLOGY WITH ANESTHESIA N/A 04/23/2024   Procedure: RADIOLOGY WITH ANESTHESIA;  Surgeon: Radiologist, Medication, MD;  Location: [facility];  Service: Radiology;  Laterality: N/A;   Family History Family History  Problem Relation Age of Onset   CAD Mother    CAD Brother    Breast cancer Sister 85    Social History Social History   Tobacco Use   Smoking status: Former    Current packs/day: 0.00    Types: Cigarettes    Quit date: 2018    Years since quitting: 7.3   Smokeless tobacco: Never  Vaping Use   Vaping status: Never Used  Substance Use Topics   Alcohol  use: Not Currently    Drug use: Not Currently   Allergies Hydralazine , Kiwi extract, Lisinopril , Shellfish allergy, Betadine [povidone iodine ], Septra [sulfamethoxazole-trimethoprim], Metformin, and Tape  Review of Systems A thorough review of systems was obtained and all systems are negative except as noted in the HPI and PMH.   Physical Exam Vital Signs  I have reviewed the triage vital signs BP (!) 122/59   Pulse 60   Temp 97.8 F (36.6 C) (Oral)   Resp 14   Ht 5\' 6"  (1.676 m)   Wt 77.2 kg   SpO2 93%   BMI 27.47 kg/m  Physical Exam Vitals and nursing note reviewed.  Constitutional:      General: She is not in acute distress.    Appearance: Normal appearance. She is well-developed. She is not ill-appearing.  HENT:     Head: Normocephalic and atraumatic.     Right Ear: External ear normal.     Left Ear: External ear normal.     Nose: Nose normal.     Mouth/Throat:     Mouth: Mucous membranes are moist.  Eyes:     General: No scleral icterus.       Right eye: No discharge.        Left eye: No discharge.  Cardiovascular:     Rate and Rhythm: Normal rate.  Pulmonary:     Effort: Pulmonary effort is normal. No respiratory distress.     Breath  sounds: No stridor.  Abdominal:     General: Abdomen is flat. There is no distension.     Tenderness: There is no guarding.  Musculoskeletal:        General: No deformity.     Cervical back: No rigidity.  Skin:    General: Skin is warm and dry.     Coloration: Skin is not cyanotic, jaundiced or pale.  Neurological:     Mental Status: She is alert. She is disoriented.     GCS: GCS eye subscore is 4. GCS verbal subscore is 5. GCS motor subscore is 6.     Cranial Nerves: Facial asymmetry present.     Sensory: Sensory deficit present.     Motor: Weakness present.     Coordination: Coordination is intact.     Comments: Left leg weakness Gait testing deferred secondary to patient safety.   Psychiatric:        Speech: Speech normal.         Behavior: Behavior normal. Behavior is cooperative.     ED Results and Treatments Labs (all labs ordered are listed, but only abnormal results are displayed) Labs Reviewed  CBC - Abnormal; Notable for the following components:      Result Value   RBC 3.49 (*)    Hemoglobin 10.2 (*)    HCT 33.2 (*)    All other components within normal limits  COMPREHENSIVE METABOLIC PANEL WITH GFR - Abnormal; Notable for the following components:   Chloride 96 (*)    Glucose, Bld 160 (*)    BUN 56 (*)    Creatinine, Ser 8.05 (*)    Albumin  3.4 (*)    AST 12 (*)    GFR, Estimated 5 (*)    All other components within normal limits  I-STAT CHEM 8, ED - Abnormal; Notable for the following components:   Sodium 134 (*)    BUN 51 (*)    Creatinine, Ser 8.10 (*)    Glucose, Bld 159 (*)    Calcium , Ion 1.01 (*)    Hemoglobin 10.5 (*)    HCT 31.0 (*)    All other components within normal limits  CBG MONITORING, ED - Abnormal; Notable for the following components:   Glucose-Capillary 191 (*)    All other components within normal limits  PROTIME-INR  APTT  DIFFERENTIAL  ETHANOL                                                                                                                          Radiology DG Elbow 2 Views Left Result Date: 04/29/2024 CLINICAL DATA:  Pain elbow pain EXAM: LEFT ELBOW - 2 VIEW COMPARISON:  CTA chest, abdomen, and pelvis dated 04/23/2024 FINDINGS: Ill-defined density dispersed through the subcutaneous soft tissues of the partially imaged upper arm overlying the ulnar aspect of the humerus. No acute dislocation. Subtle cortical angulation at the level of the radial neck. Moderate elbow joint effusion. IMPRESSION: 1. Subtle cortical angulation  at the level of the radial neck, which may represent a nondisplaced fracture in the appropriate clinical setting. 2. Moderate elbow joint effusion. 3. Ill-defined density dispersed through the subcutaneous soft tissues of the  partially imaged upper arm overlying the ulnar aspect of the humerus, which may represent extravasated contrast material. The patient underwent a contrast-enhanced CT examination on 04/23/2024 with contrast instillation via the left upper extremity. Electronically Signed   By: Limin  Xu M.D.   On: 04/29/2024 13:22   MR BRAIN WO CONTRAST Result Date: 04/29/2024 CLINICAL DATA:  Neuro deficit, acute, stroke suspected EXAM: MRI HEAD WITHOUT CONTRAST MRA HEAD WITHOUT CONTRAST MRA NECK WITHOUT CONTRAST TECHNIQUE: Multiplanar, multiecho pulse sequences of the brain and surrounding structures were obtained without intravenous contrast. Angiographic images of the Circle of Willis were obtained using MRA technique without intravenous contrast. Angiographic images of the neck were obtained using MRA technique without intravenous contrast. Carotid stenosis measurements (when applicable) are obtained utilizing NASCET criteria, using the distal internal carotid diameter as the denominator. COMPARISON:  CTA 04/23/2024, MRI head 12/07/2023 FINDINGS: MRI HEAD Motion artifact. Brain: There is no acute infarction or intracranial hemorrhage. There is no intracranial mass, mass effect, or edema. There is no hydrocephalus or extra-axial fluid collection. Ventricles and sulci are stable in size and configuration. Patchy foci of T2 hyperintensity in the supratentorial white matter are nonspecific but may reflect stable mild chronic microvascular ischemic changes. Vascular: Major vessel flow voids at the skull base are preserved. Skull and upper cervical spine: Normal marrow signal is preserved. Sinuses/Orbits: Postoperative changes with minimal patchy mucosal thickening. Right lens replacement. Other: Evidence of prior sellar mass resection minimal patchy mastoid fluid opacification. Retention cysts along the posterior wall of the nasopharynx. MRA HEAD Motion artifact. Intracranial internal carotid arteries are patent. Atherosclerotic  irregularity with stenosis. There is up to marked stenosis of the right cavernous ICA and moderate marked stenosis of the left paraclinoid ICA, likely similar to prior CTA. Middle and anterior cerebral arteries are patent. 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 Intracranial vertebral arteries, basilar artery, posterior cerebral arteries are patent. Bilateral posterior communicating arteries are present. No significant stenosis. MRA NECK Motion artifact. Right common, internal, and external carotid arteries are patent. There is plaque without hemodynamically significant stenosis at the ICA origin. Subsequent circumferential narrowing of the proximal ICA causing greater than 70% stenosis. Likely no significant change from the prior CTA. Left common carotid artery is patent. There is loss of flow at the distal CCA and proximal ICA that may be related to interval stent placement. Mid to distal cervical ICA is patent. Vertebral arteries are patent. There is limited evaluation proximally due to artifact. No significant stenosis identified. IMPRESSION: Degraded by motion. No acute infarction, hemorrhage, or mass. No significant change since the prior brain MRI. Loss of flow related enhancement of the distal CCA and proximal ICA is probably related to artifact from interval stent. Greater than 70% stenosis of the proximal right ICA is probably similar to the prior CTA. Right greater than left intracranial ICA atherosclerosis and stenosis is likely similar to the prior CTA. 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 Electronically Signed   By: Geannie Keener M.D.   On: 04/29/2024 12:38   MR ANGIO HEAD WO CONTRAST Result Date: 04/29/2024 CLINICAL DATA:  Neuro deficit, acute, stroke suspected EXAM: MRI HEAD WITHOUT CONTRAST MRA HEAD WITHOUT CONTRAST MRA NECK WITHOUT CONTRAST TECHNIQUE: Multiplanar, multiecho pulse sequences of the brain and surrounding structures were  obtained without  intravenous contrast. Angiographic images of the Circle of Willis were obtained using MRA technique without intravenous contrast. Angiographic images of the neck were obtained using MRA technique without intravenous contrast. Carotid stenosis measurements (when applicable) are obtained utilizing NASCET criteria, using the distal internal carotid diameter as the denominator. COMPARISON:  CTA 04/23/2024, MRI head 12/07/2023 FINDINGS: MRI HEAD Motion artifact. Brain: There is no acute infarction or intracranial hemorrhage. There is no intracranial mass, mass effect, or edema. There is no hydrocephalus or extra-axial fluid collection. Ventricles and sulci are stable in size and configuration. Patchy foci of T2 hyperintensity in the supratentorial white matter are nonspecific but may reflect stable mild chronic microvascular ischemic changes. Vascular: Major vessel flow voids at the skull base are preserved. Skull and upper cervical spine: Normal marrow signal is preserved. Sinuses/Orbits: Postoperative changes with minimal patchy mucosal thickening. Right lens replacement. Other: Evidence of prior sellar mass resection minimal patchy mastoid fluid opacification. Retention cysts along the posterior wall of the nasopharynx. MRA HEAD Motion artifact. Intracranial internal carotid arteries are patent. Atherosclerotic irregularity with stenosis. There is up to marked stenosis of the right cavernous ICA and moderate marked stenosis of the left paraclinoid ICA, likely similar to prior CTA. Middle and anterior cerebral arteries are patent. 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 Intracranial vertebral arteries, basilar artery, posterior cerebral arteries are patent. Bilateral posterior communicating arteries are present. No significant stenosis. MRA NECK Motion artifact. Right common, internal, and external carotid arteries are patent. There is plaque without hemodynamically significant stenosis at the ICA origin.  Subsequent circumferential narrowing of the proximal ICA causing greater than 70% stenosis. Likely no significant change from the prior CTA. Left common carotid artery is patent. There is loss of flow at the distal CCA and proximal ICA that may be related to interval stent placement. Mid to distal cervical ICA is patent. Vertebral arteries are patent. There is limited evaluation proximally due to artifact. No significant stenosis identified. IMPRESSION: Degraded by motion. No acute infarction, hemorrhage, or mass. No significant change since the prior brain MRI. Loss of flow related enhancement of the distal CCA and proximal ICA is probably related to artifact from interval stent. Greater than 70% stenosis of the proximal right ICA is probably similar to the prior CTA. Right greater than left intracranial ICA atherosclerosis and stenosis is likely similar to the prior CTA. 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 Electronically Signed   By: Geannie Keener M.D.   On: 04/29/2024 12:38   MR ANGIO NECK WO CONTRAST Result Date: 04/29/2024 CLINICAL DATA:  Neuro deficit, acute, stroke suspected EXAM: MRI HEAD WITHOUT CONTRAST MRA HEAD WITHOUT CONTRAST MRA NECK WITHOUT CONTRAST TECHNIQUE: Multiplanar, multiecho pulse sequences of the brain and surrounding structures were obtained without intravenous contrast. Angiographic images of the Circle of Willis were obtained using MRA technique without intravenous contrast. Angiographic images of the neck were obtained using MRA technique without intravenous contrast. Carotid stenosis measurements (when applicable) are obtained utilizing NASCET criteria, using the distal internal carotid diameter as the denominator. COMPARISON:  CTA 04/23/2024, MRI head 12/07/2023 FINDINGS: MRI HEAD Motion artifact. Brain: There is no acute infarction or intracranial hemorrhage. There is no intracranial mass, mass effect, or edema. There is no hydrocephalus or extra-axial  fluid collection. Ventricles and sulci are stable in size and configuration. Patchy foci of T2 hyperintensity in the supratentorial white matter are nonspecific but may reflect stable mild chronic microvascular ischemic changes. Vascular: Major vessel flow  voids at the skull base are preserved. Skull and upper cervical spine: Normal marrow signal is preserved. Sinuses/Orbits: Postoperative changes with minimal patchy mucosal thickening. Right lens replacement. Other: Evidence of prior sellar mass resection minimal patchy mastoid fluid opacification. Retention cysts along the posterior wall of the nasopharynx. MRA HEAD Motion artifact. Intracranial internal carotid arteries are patent. Atherosclerotic irregularity with stenosis. There is up to marked stenosis of the right cavernous ICA and moderate marked stenosis of the left paraclinoid ICA, likely similar to prior CTA. Middle and anterior cerebral arteries are patent. 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 Intracranial vertebral arteries, basilar artery, posterior cerebral arteries are patent. Bilateral posterior communicating arteries are present. No significant stenosis. MRA NECK Motion artifact. Right common, internal, and external carotid arteries are patent. There is plaque without hemodynamically significant stenosis at the ICA origin. Subsequent circumferential narrowing of the proximal ICA causing greater than 70% stenosis. Likely no significant change from the prior CTA. Left common carotid artery is patent. There is loss of flow at the distal CCA and proximal ICA that may be related to interval stent placement. Mid to distal cervical ICA is patent. Vertebral arteries are patent. There is limited evaluation proximally due to artifact. No significant stenosis identified. IMPRESSION: Degraded by motion. No acute infarction, hemorrhage, or mass. No significant change since the prior brain MRI. Loss of flow related enhancement of the distal CCA  and proximal ICA is probably related to artifact from interval stent. Greater than 70% stenosis of the proximal right ICA is probably similar to the prior CTA. Right greater than left intracranial ICA atherosclerosis and stenosis is likely similar to the prior CTA. 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 Electronically Signed   By: Geannie Keener M.D.   On: 04/29/2024 12:38   CT HEAD CODE STROKE WO CONTRAST Result Date: 04/29/2024 CLINICAL DATA:  Code stroke.  Left-sided weakness EXAM: CT HEAD WITHOUT CONTRAST TECHNIQUE: Contiguous axial images were obtained from the base of the skull through the vertex without intravenous contrast. RADIATION DOSE REDUCTION: This exam was performed according to the departmental dose-optimization program which includes automated exposure control, adjustment of the mA and/or kV according to patient size and/or use of iterative reconstruction technique. COMPARISON:  Six days prior FINDINGS: Brain: No evidence of acute infarction, hemorrhage, hydrocephalus, extra-axial collection or mass lesion/mass effect. Extensive dural calcification Vascular: No hyperdense vessel. Scattered areas of calcification within the superior sagittal and left transverse sigmoid dural sinuses. Skull: Normal. Negative for fracture or focal lesion. Sinuses/Orbits: Chronic sinusitis with prior endoscopic sinus surgery Other: Prelim sent in epic chat ASPECTS [facility] Stroke Program Early CT Score) - Ganglionic level infarction (caudate, lentiform nuclei, internal capsule, insula, M1-M3 cortex): 7 - Supraganglionic infarction (M4-M6 cortex): 3 Total score (0-10 with 10 being normal): 10 IMPRESSION: No acute or interval finding. Electronically Signed   By: Ronnette Coke M.D.   On: 04/29/2024 11:08    Pertinent labs & imaging results that were available during my care of the patient were reviewed by me and considered in my medical decision making (see MDM for details).  Medications  Ordered in ED Medications  iohexol  (OMNIPAQUE ) 350 MG/ML injection 100 mL (has no administration in time range)  sodium chloride  flush (NS) 0.9 % injection 3 mL (3 mLs Intravenous Given 04/29/24 1300)  HYDROmorphone  (DILAUDID ) injection 1 mg (1 mg Intramuscular Given by Other 04/29/24 1156)  Procedures .Critical Care  Performed by: Teddi Favors, DO Authorized by: Teddi Favors, DO   Critical care provider statement:    Critical care time (minutes):  32   Critical care time was exclusive of:  Separately billable procedures and treating other patients   Critical care was necessary to treat or prevent imminent or life-threatening deterioration of the following conditions:  CNS failure or compromise   Critical care was time spent personally by me on the following activities:  Development of treatment plan with patient or surrogate, discussions with consultants, evaluation of patient's response to treatment, examination of patient, ordering and review of laboratory studies, ordering and review of radiographic studies, ordering and performing treatments and interventions, pulse oximetry, re-evaluation of patient's condition and review of old charts  Emergency Ultrasound Study:   Angiocath insertion Performed by: Teddi Favors  Consent: Verbal consent obtained. Risks and benefits: risks, benefits and alternatives were discussed Immediately prior to procedure the correct patient, procedure, equipment, support staff and site/side marked as needed.  Indication: difficult IV access Preparation: Patient was prepped and draped in the usual sterile fashion.   Images were not saved due to the time sensitive nature of the situation. Normal blood return.  Patient tolerance: Patient tolerated the procedure well with no immediate complications.   (including critical care  time)  Medical Decision Making / ED Course    Medical Decision Making:    Terry Arroyo is a 63 y.o. female  with past medical history as below, significant for ACA CHF, COPD, CAD, depression, HFrEF, CABG x 4, ESRD MWF, CVA, left ICA stent placement earlier this month who presents to the ED with complaint of code stroke. The complaint involves an extensive differential diagnosis and also carries with it a high risk of complications and morbidity.  Serious etiology was considered. Ddx includes but is not limited to: Differential diagnoses for altered mental status includes but is not exclusive to alcohol , illicit or prescription medications, intracranial pathology such as stroke, intracerebral hemorrhage, fever or infectious causes including sepsis, hypoxemia, uremia, trauma, endocrine related disorders such as diabetes, hypoglycemia, thyroid -related diseases, etc.   Complete initial physical exam performed, notably the patient was in no acute distress, airway intact.    Reviewed and confirmed nursing documentation for past medical history, family history, social history.  Vital signs reviewed.     Brief summary:  63 yo female hx as above including recent LEFT ICA stent placement earlier this month here as code stroke    Clinical Course as of 04/29/24 1414  Sun Apr 29, 2024  1345 Spoke with dr Murvin Arthurs, recommends medicine admission  [SG]  1401 Elbow xr with possible radial neck fx, she does have pain here. Will place in sling  [SG]    Clinical Course User Index [SG] Teddi Favors, DO     Patient's neurologic exam did improve during evaluation.  Neurodeficits improving.  Still having residual facial droop  MRI/MRA negative.  Spoke with neurology, concern for possible TIA likely due to symptomatic stenosis of the right ICA.  Recommend admission.  Patient also has pain to her left elbow, found to have possible radial neck fracture.  Appears nondisplaced.  She is NVI.  Will  place in sling.  Follow-up Ortho outpatient.             Additional history obtained: -Additional history obtained from ems -External records from outside source obtained and reviewed including: Chart review including previous notes, labs, imaging, consultation notes  including  Prior admission, prior labs and imaging   Lab Tests: -I ordered, reviewed, and interpreted labs.   The pertinent results include:   Labs Reviewed  CBC - Abnormal; Notable for the following components:      Result Value   RBC 3.49 (*)    Hemoglobin 10.2 (*)    HCT 33.2 (*)    All other components within normal limits  COMPREHENSIVE METABOLIC PANEL WITH GFR - Abnormal; Notable for the following components:   Chloride 96 (*)    Glucose, Bld 160 (*)    BUN 56 (*)    Creatinine, Ser 8.05 (*)    Albumin  3.4 (*)    AST 12 (*)    GFR, Estimated 5 (*)    All other components within normal limits  I-STAT CHEM 8, ED - Abnormal; Notable for the following components:   Sodium 134 (*)    BUN 51 (*)    Creatinine, Ser 8.10 (*)    Glucose, Bld 159 (*)    Calcium , Ion 1.01 (*)    Hemoglobin 10.5 (*)    HCT 31.0 (*)    All other components within normal limits  CBG MONITORING, ED - Abnormal; Notable for the following components:   Glucose-Capillary 191 (*)    All other components within normal limits  PROTIME-INR  APTT  DIFFERENTIAL  ETHANOL    Notable for labs consistent with ESRD  EKG   EKG Interpretation Date/Time:    Ventricular Rate:    PR Interval:    QRS Duration:    QT Interval:    QTC Calculation:   R Axis:      Text Interpretation:           Imaging Studies ordered: I ordered imaging studies including CT head, MRI/MRA, elbow x-ray I independently visualized the following imaging with scope of interpretation limited to determining acute life threatening conditions related to emergency care; findings noted above I agree with the radiologist interpretation If any imaging was  obtained with contrast I closely monitored patient for any possible adverse reaction a/w contrast administration in the emergency department   Medicines ordered and prescription drug management: Meds ordered this encounter  Medications   sodium chloride  flush (NS) 0.9 % injection 3 mL   iohexol  (OMNIPAQUE ) 350 MG/ML injection 100 mL   HYDROmorphone  (DILAUDID ) injection 1 mg    -I have reviewed the patients home medicines and have made adjustments as needed   Consultations Obtained: I requested consultation with the neurology,  and discussed lab and imaging findings as well as pertinent plan - they recommend: admit   Cardiac Monitoring: The patient was maintained on a cardiac monitor.  I personally viewed and interpreted the cardiac monitored which showed an underlying rhythm of: nsr Continuous pulse oximetry interpreted by myself, 95% on RA.    Social Determinants of Health:  Diagnosis or treatment significantly limited by social determinants of health: former smoker   Reevaluation: After the interventions noted above, I reevaluated the patient and found that they have improved  Co morbidities that complicate the patient evaluation  Past Medical History:  Diagnosis Date   Anemia    Angina at rest [facility])    Angioedema 03/20/2021   Lisinopril    Anterior cerebral aneurysm    approximately 5 mm; left MCA    Anxiety    Aortic atherosclerosis (HCC)    Asthma    Blind left eye    CHF (congestive heart failure) (HCC)    COPD (chronic  obstructive pulmonary disease) (HCC)    Coronary artery disease    Depression    DOE (dyspnea on exertion)    ESRD (end stage renal disease) (HCC)    Gait instability    Genital herpes    GERD (gastroesophageal reflux disease)    HFrEF (heart failure with reduced ejection fraction) (HCC)    History of 2019 novel coronavirus disease (COVID-19) 03/2019   [facility] [place]    History of DVT (deep vein thrombosis)    History of  DVT (deep vein thrombosis)    Hx of CABG 2018   x 4; performed in [place]    Hyperlipidemia    Hypertension    MI (myocardial infarction) (HCC) 2018   no stents   Pituitary adenoma (HCC)    PVD (posterior vitreous detachment), right eye    Secondary hyperparathyroidism (HCC)    Sleep apnea    non-compliant with nocturnal PAP therapy   Stroke [facility]) 2014   stated affected left side.   T2DM (type 2 diabetes mellitus) (HCC)       Dispostion: Disposition decision including need for hospitalization was considered, and patient admitted to the hospital.    Final Clinical Impression(s) / ED Diagnoses Final diagnoses:  TIA (transient ischemic attack)  Closed nondisplaced fracture of neck of left radius, initial encounter        Teddi Favors, DO 04/29/24 1414

## 2024-04-29 NOTE — ED Notes (Signed)
 Patient transported to CT

## 2024-04-29 NOTE — ED Notes (Signed)
 CCM called for tele admit.

## 2024-04-29 NOTE — ED Notes (Signed)
 Patient is C/O left elbow pain. MD notified.

## 2024-04-29 NOTE — ED Notes (Signed)
 18g IV inserted and flushed by Deann Exon MD in the left upper arm prior to CT, but when CT started the concentration the vein blew. IV then removed and patient transported to MRI instead due to inability to get an IV for the CT exams.

## 2024-04-29 NOTE — ED Notes (Signed)
 Patient transported to MRI

## 2024-04-29 NOTE — ED Triage Notes (Signed)
 Patient BIB EMS C/O stroke symptoms. Patient was discharged yesterday from a previous stroke that she had 2 days ago. Patient is having symptoms on the left side, opposite from previous stroke. Patient arrives, stroke team initially scoring her a 6 on NIHSS. Patient last known well was 0930 this AM.

## 2024-04-29 NOTE — Consult Note (Signed)
 NEUROLOGY CONSULT NOTE   Date of service: Apr 29, 2024 Patient Name: Terry Arroyo MRN:  [id number] DOB:  [birth year] Chief Complaint: "CODE STROKE" Requesting Provider: Teddi Favors, DO  History of Present Illness  Terry Arroyo is a 63 y.o. female with hx of Stroke 2014 affecting left side, Left ICA stent placement 04/2024 for acute stroke and discharged 5/10 on aspirin  and brilinta , ESRD, CAD, CHFrEF, s/p CABG 2018, MI 2018, OSA who was BIB EMS as an activated CODE STROKE due to left sided weakness and slurred speech.  Patient was just taking a walk with her home health RN and began to feel tired and weak afterwards. She then developed slurred speech and left-sided weakness.  On initial exam at bridge, she had left facial droop, expressive aphasia with only grunts and some very slurred nonsensical words in response to orientation questions, did follow commands, left sided weakness and sensory deficit.  During subsequent exams in CT, patient began to improve--able to answer orientation questions and identify objects correctly, slight left leg weakness and sensory deficit only, still had slight facial droop. CTH negative. CTA ordered, but after several attempts, unable to achieve IV access. Patient take to MRI for MRI/MRA head and neck without contrast. MRI negative for acute stroke.   LKW: 0930 Modified rankin score: 1-No significant post stroke disability and can perform usual duties with stroke symptoms IV Thrombolysis: No, symptoms improving EVT: No, no LVO, symptoms mild and improving.   On initial exam:  NIHSS components Score: Comment  1a Level of Conscious 0[x]  1[]  2[]  3[]      1b LOC Questions 0[]  1[]  2[x]       1c LOC Commands 0[x]  1[]  2[]       2 Best Gaze 0[x]  1[]  2[]       3 Visual 0[x]  1[]  2[]  3[]      4 Facial Palsy 0[]  1[x]  2[]  3[]      5a Motor Arm - left 0[]  1[]  2[x]  3[]  4[]  UN[]    5b Motor Arm - Right 0[x]  1[]  2[]  3[]  4[]  UN[]    6a Motor Leg - Left 0[]  1[x]  2[]  3[]  4[]   UN[]    6b Motor Leg - Right 0[x]  1[]  2[]  3[]  4[]  UN[]    7 Limb Ataxia 0[x]  1[]  2[]  UN[]      8 Sensory 0[]  1[x]  2[]  UN[]      9 Best Language 0[]  1[]  2[x]  3[]      10 Dysarthria 0[]  1[x]  2[]  UN[]      11 Extinct. and Inattention 0[x]  1[]  2[]       TOTAL:   10     On subsequent exam: NIHSS components Score: Comment  1a Level of Conscious 0[x]  1[]  2[]  3[]      1b LOC Questions 0[x]  1[]  2[]       1c LOC Commands 0[x]  1[]  2[]       2 Best Gaze 0[x]  1[]  2[]       3 Visual 0[x]  1[]  2[]  3[]      4 Facial Palsy 0[]  1[x]  2[]  3[]      5a Motor Arm - left 0[x]  1[]  2[]  3[]  4[]  UN[]    5b Motor Arm - Right 0[x]  1[]  2[]  3[]  4[]  UN[]    6a Motor Leg - Left 0[]  1[x]  2[]  3[]  4[]  UN[]    6b Motor Leg - Right 0[x]  1[]  2[]  3[]  4[]  UN[]    7 Limb Ataxia 0[x]  1[]  2[]  UN[]      8 Sensory 0[]  1[x]  2[]  UN[]      9 Best Language 0[x]  1[]  2[]  3[]   10 Dysarthria 0[x]  1[]  2[]  UN[]      11 Extinct. and Inattention 0[x]  1[]  2[]       TOTAL:     3      ROS  Comprehensive ROS performed and pertinent positives documented in HPI   Past History   Past Medical History:  Diagnosis Date   Anemia    Angina at rest [facility])    Angioedema 03/20/2021   Lisinopril    Anterior cerebral aneurysm    approximately 5 mm; left MCA    Anxiety    Aortic atherosclerosis (HCC)    Asthma    Blind left eye    CHF (congestive heart failure) (HCC)    COPD (chronic obstructive pulmonary disease) (HCC)    Coronary artery disease    Depression    DOE (dyspnea on exertion)    ESRD (end stage renal disease) (HCC)    Gait instability    Genital herpes    GERD (gastroesophageal reflux disease)    HFrEF (heart failure with reduced ejection fraction) (HCC)    History of 2019 novel coronavirus disease (COVID-19) 03/2019   [facility] [place]    History of DVT (deep vein thrombosis)    History of DVT (deep vein thrombosis)    Hx of CABG 2018   x 4; performed in [place]    Hyperlipidemia    Hypertension    MI (myocardial  infarction) (HCC) 2018   no stents   Pituitary adenoma (HCC)    PVD (posterior vitreous detachment), right eye    Secondary hyperparathyroidism (HCC)    Sleep apnea    non-compliant with nocturnal PAP therapy   Stroke [facility]) 2014   stated affected left side.   T2DM (type 2 diabetes mellitus) [facility])     Past Surgical History:  Procedure Laterality Date   A/V FISTULAGRAM Right 12/29/2021   Procedure: A/V FISTULAGRAM;  Surgeon: Jackquelyn Mass, MD;  Location: [facility] INVASIVE CV LAB;  Service: Cardiovascular;  Laterality: Right;   A/V FISTULAGRAM Right 06/15/2022   Procedure: A/V Fistulagram;  Surgeon: Jackquelyn Mass, MD;  Location: [facility] INVASIVE CV LAB;  Service: Cardiovascular;  Laterality: Right;   A/V FISTULAGRAM Right 04/17/2024   Procedure: A/V Fistulagram;  Surgeon: Jackquelyn Mass, MD;  Location: [facility] INVASIVE CV LAB;  Service: Cardiovascular;  Laterality: Right;   AV FISTULA PLACEMENT Right 01/28/2021   Procedure: ARTERIOVENOUS (AV) FISTULA CREATION;  Surgeon: Jackquelyn Mass, MD;  Location: [facility] ORS;  Service: Vascular;  Laterality: Right;   AV FISTULA PLACEMENT Right 05/13/2021   Procedure: INSERTION OF ARTERIOVENOUS (AV) GORE-TEX GRAFT ARM ( BRACHIAL AXILLARY );  Surgeon: Jackquelyn Mass, MD;  Location: [facility] ORS;  Service: Vascular;  Laterality: Right;   BRAIN SURGERY  1986   pituitary tumor   BREAST EXCISIONAL BIOPSY Left yrs ago   benign   CESAREAN SECTION     COLONOSCOPY N/A 05/26/2023   Procedure: COLONOSCOPY;  Surgeon: Quintin Buckle, DO;  Location: [facility] ENDOSCOPY;  Service: Gastroenterology;  Laterality: N/A;  DIALYSIS   CORONARY ARTERY BYPASS GRAFT  2018   x 4 vessels; performed in [place]    DIALYSIS/PERMA CATHETER INSERTION N/A 11/07/2020   Procedure: DIALYSIS/PERMA CATHETER INSERTION;  Surgeon: Celso College, MD;  Location: [facility] INVASIVE CV LAB;  Service: Cardiovascular;  Laterality: N/A;   DIALYSIS/PERMA CATHETER REMOVAL N/A 06/12/2021   Procedure:  DIALYSIS/PERMA CATHETER REMOVAL;  Surgeon: Celso College, MD;  Location: [facility] INVASIVE CV LAB;  Service: Cardiovascular;  Laterality: N/A;  ESOPHAGOGASTRODUODENOSCOPY (EGD) WITH PROPOFOL  N/A 12/22/2021   Procedure: ESOPHAGOGASTRODUODENOSCOPY (EGD) WITH PROPOFOL ;  Surgeon: Shane Darling, MD;  Location: [facility] ENDOSCOPY;  Service: Gastroenterology;  Laterality: N/A;  IDDM   EYE SURGERY Right 2021   cataracts    IR CT HEAD LTD  04/23/2024   IR PERCUTANEOUS ART THROMBECTOMY/INFUSION INTRACRANIAL INC DIAG ANGIO  04/23/2024   IR US  GUIDE VASC ACCESS RIGHT  04/23/2024   PERIPHERAL VASCULAR THROMBECTOMY Right 04/03/2021   Procedure: A/V Fistulagram ;  Surgeon: Jackquelyn Mass, MD;  Location: [facility] INVASIVE CV LAB;  Service: Cardiovascular;  Laterality: Right;   RADIOLOGY WITH ANESTHESIA N/A 04/23/2024   Procedure: RADIOLOGY WITH ANESTHESIA;  Surgeon: Radiologist, Medication, MD;  Location: [facility] OR;  Service: Radiology;  Laterality: N/A;    Family History: Family History  Problem Relation Age of Onset   CAD Mother    CAD Brother    Breast cancer Sister 45    Social History  reports that she quit smoking about 7 years ago. Her smoking use included cigarettes. She has never used smokeless tobacco. She reports that she does not currently use alcohol . She reports that she does not currently use drugs.  Allergies  Allergen Reactions   Hydralazine  Hives, Shortness Of Breath, Swelling and Rash    Body aches, also    Kiwi Extract Hives, Swelling, Rash and Other (See Comments)    Mouth swells    Lisinopril  Swelling and Other (See Comments)    Angioedema   Shellfish Allergy Anaphylaxis and Other (See Comments)    Shrimp/lobster    Betadine [Povidone Iodine ] Hives and Rash    LEAVES WELTS ON SKIN   Septra [Sulfamethoxazole-Trimethoprim] Other (See Comments)    Reaction not stated   Metformin Diarrhea and Other (See Comments)    GI Intolerance   Tape Itching    Use paper tape whenever possible     Medications   Current Facility-Administered Medications:    iohexol  (OMNIPAQUE ) 350 MG/ML injection 100 mL, 100 mL, Intravenous, Once PRN, Lehner, Erin C, NP   sodium chloride  flush (NS) 0.9 % injection 3 mL, 3 mL, Intravenous, Once, Russella Courts A, DO  Current Outpatient Medications:    acetaminophen  (TYLENOL ) 500 MG tablet, Take 1,000 mg by mouth every 6 (six) hours as needed for mild pain (pain score 1-3) or moderate pain (pain score 4-6)., Disp: , Rfl:    albuterol  (VENTOLIN  HFA) 108 (90 Base) MCG/ACT inhaler, Inhale 2 puffs into the lungs every 6 (six) hours as needed for wheezing or shortness of breath., Disp: , Rfl:    amLODipine  (NORVASC ) 10 MG tablet, Take 1 tablet (10 mg total) by mouth daily., Disp: 30 tablet, Rfl: 0   aspirin  81 MG EC tablet, Take 1 tablet (81 mg total) by mouth daily., Disp: 30 tablet, Rfl: 0   atorvastatin  (LIPITOR ) 80 MG tablet, Take 1 tablet (80 mg total) by mouth daily at 6 PM., Disp: 30 tablet, Rfl: 0   B Complex-C-Folic Acid  (RENA-VITE RX) 1 MG TABS, Take 1 tablet by mouth daily., Disp: , Rfl:    Blood Glucose Monitoring Suppl (TRUE METRIX METER) w/Device KIT, , Disp: , Rfl:    carvedilol  (COREG ) 25 MG tablet, Take 25 mg by mouth 2 (two) times daily., Disp: , Rfl:    ezetimibe  (ZETIA ) 10 MG tablet, Take 1 tablet (10 mg total) by mouth daily., Disp: 30 tablet, Rfl: 2   gabapentin  (NEURONTIN ) 300 MG capsule, Take 300 mg by mouth at bedtime., Disp: , Rfl:  lidocaine -prilocaine  (EMLA ) cream, Apply 1 Application topically every Monday, Wednesday, and Friday with hemodialysis., Disp: , Rfl:    mirtazapine  (REMERON ) 30 MG tablet, Take 30 mg by mouth at bedtime., Disp: , Rfl:    NOVOLOG  MIX 70/30 FLEXPEN (70-30) 100 UNIT/ML FlexPen, Inject 15 Units into the skin 3 (three) times daily with meals., Disp: , Rfl:    ticagrelor  (BRILINTA ) 90 MG TABS tablet, Take 1 tablet (90 mg total) by mouth 2 (two) times daily., Disp: 60 tablet, Rfl: 2   topiramate  (TOPAMAX ) 50  MG tablet, Take 50 mg by mouth daily., Disp: , Rfl:    traZODone  (DESYREL ) 50 MG tablet, Take 0.5 tablets (25 mg total) by mouth at bedtime. (Patient taking differently: Take 50 mg by mouth at bedtime.), Disp: , Rfl:    TRUE METRIX BLOOD GLUCOSE TEST test strip, , Disp: , Rfl:    vitamin B-12 (CYANOCOBALAMIN ) 1000 MCG tablet, Take 1,000 mcg by mouth daily., Disp: , Rfl:   Vitals   Vitals:   04/29/24 1056  Weight: 77.2 kg    Body mass index is 27.47 kg/m.  Physical Exam   Constitutional: Appears well-developed and well-nourished.  Psych: Affect appropriate to situation.  Cardiovascular: Normal rate and regular rhythm.  Respiratory: Effort normal, non-labored breathing. Room Air.   Neurologic Examination   Neuro: Mental Status: Patient is awake, alert, oriented to person, place, month, year, and situation. Patient is able to give a clear and coherent history. No signs of dysarhtria, aphasia or neglect Cranial Nerves: II: Visual Fields are full. Pupils are equal, round, and reactive to light.   III,IV, VI: EOMI without ptosis or diploplia.  V: Facial sensation is symmetric to temperature VII: Left facial droop  VIII: hearing is intact to voice X: Uvula elevates symmetrically, no hoarseness XI: Shoulder shrug is symmetric. XII: tongue is midline without atrophy or fasciculations.  Motor: Tone is normal. Bulk is normal. RUE/RLE: 5/5 with no drift LUE: 4/5 grip, 4+/5 shoulder and elbow (weakness due to pain), no drift LLE: 4-/5 hip, 4/5 knee, 4+/5 ankle, slight drift.  Sensory: Sensation is subjectively decreased on the left leg.  Cerebellar: FNF and HKS are intact bilaterally   Labs/Imaging/Neurodiagnostic studies   CBC:  Recent Labs  Lab May 05, 2024 1230 04/24/24 0616 04/26/24 0959 04/27/24 1758  WBC 8.7   < > 8.2 7.8  NEUTROABS 6.3  --   --   --   HGB 11.9*   < > 10.3* 10.8*  HCT 37.6   < > 31.3* 33.2*  MCV 93.1   < > 89.7 91.0  PLT 201   < > 190 240   < > =  values in this interval not displayed.   Basic Metabolic Panel:  Lab Results  Component Value Date   NA 135 04/27/2024   K 4.2 04/27/2024   CO2 25 04/27/2024   GLUCOSE 139 (H) 04/27/2024   BUN 36 (H) 04/27/2024   CREATININE 5.83 (H) 04/27/2024   CALCIUM  8.9 04/27/2024   GFRNONAA 8 (L) 04/27/2024   GFRAA 18 (L) 09/15/2020   Lipid Panel:  Lab Results  Component Value Date   LDLCALC 75 04/24/2024   HgbA1c:  Lab Results  Component Value Date   HGBA1C 8.6 (H) 05/05/24   Urine Drug Screen:     Component Value Date/Time   LABOPIA NONE DETECTED 02/26/2020 1620   COCAINSCRNUR NONE DETECTED 02/26/2020 1620   LABBENZ NONE DETECTED 02/26/2020 1620   AMPHETMU NONE DETECTED 02/26/2020 1620   THCU  NONE DETECTED 02/26/2020 1620   LABBARB NONE DETECTED 02/26/2020 1620    Alcohol  Level     Component Value Date/Time   ETH <15 04/23/2024 1230   INR  Lab Results  Component Value Date   INR 1.1 04/24/2024   APTT  Lab Results  Component Value Date   APTT 33 04/23/2024   AED levels: No results found for: "PHENYTOIN", "ZONISAMIDE", "LAMOTRIGINE", "LEVETIRACETA"  CT Head without contrast(Personally reviewed): No acute or interval finding   MR Angio head without contrast and MR angio neck without contrast(Personally reviewed): Degraded by motion.   No acute infarction, hemorrhage, or mass. No significant change since the prior brain MRI.   Loss of flow related enhancement of the distal CCA and proximal ICA is probably related to artifact from interval stent. Greater than 70% stenosis of the proximal right ICA is probably similar to the prior CTA.   Right greater than left intracranial ICA atherosclerosis and stenosis is likely similar to the prior CTA.   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  MRI Brain(Personally reviewed): No acute stroke   ASSESSMENT   Terry Arroyo is a 63 y.o. female with hx of Stroke 2014 affecting left side, Left  ICA stent placement 04/2024 for acute stroke and discharged 5/10 on aspirin  and brilinta , ESRD, CAD, CHFrEF, s/p CABG 2018, MI 2018, OSA who was BIB EMS as an activated CODE STROKE due to left sided weakness and slurred speech.   During subsequent exams in CT, patient began to improve--able to answer orientation questions and identify objects correctly, slight left leg weakness and sensory deficit only, still had slight facial droop. CTH negative. Patient take to MRI for MRI/MRA head and neck without contrast. MRI negative for acute stroke.   Patient recently had TEE, Lipid Panel, A1c testing--no need to repeat. Carotid US  showed 70-80% and approaching string sign on her right ICA, which no intervention was completed on, recommend getting CTA or repeating Carotid US .   Impression: TIA likely due to symptomatic stenosis of right ICA  RECOMMENDATIONS   - Admit for TIA workup - Q2H Neuro checks - STAT CTH with any acute neuro change - Vascular imaging - CT Angio head and Neck or Carotid US  if unable to get CTA - Antithrombotic - continue Aspirin  and Brilinta  that patient was discharged on - SBP goal - <220, PRN labetalol  if HR>60 and PRN Hydralazine  if HR<60 Permissive hypertension for 24-48 hours since symptom onset. Avoid hypotension.  - Telemetry monitoring for arrhythmia - 72h - Swallow screen - will be performed prior to PO intake - Stroke education - will be given - PT/OT/SLP  ______________________________________________________________________    Signed, Audrene Lease, NP [facility]   NEUROHOSPITALIST ADDENDUM Performed a face to face diagnostic evaluation.   I have reviewed the contents of history and physical exam as documented by PA/ARNP/Resident and agree with above documentation.  I have discussed and formulated the above plan as documented. Edits to the note have been made as needed.  Impression/Key exam findings/Plan: Hada  lot of functional overlay when  she initially presented. She was crying and hard to get much out of her. Once she calmed down, still as a slight L facial asymmetry and reports numbness in L arm and L face. I do not feel that numbness and a slight facial asymmetry is a disabling deficit and she was therefore not offered tnkase. She is also improving rapidly and now non disabling deficit and therefore tnkase was  not offered. I would recommend medicine admission with Q2H neuro checks. Neuro IR is aware and if she develops disabling deficits overnight, will consider emergent stenting. However, I would otherwise recommend having vascular surgery consult to evaluate for intervention over the next couple of days.  Continue aspirin  and brilinta .  Will get carotid doppler US  in addition to MR Angio head and neck. Despite multiple attempts, we were unable to get a functioning IV and unfortunately unable to get CT Angio head and neck. Non contrast MR angio neck depends on time of flow imaging which is not as reliable.  We got MRI Brain without contrast which was negative for an acute stroke but clinically, she still has slight left facial asymmetry and reports left sided numbness. She feels left side feels 50% less than right side.  Plan discussed with Dr. Martina Sledge with the ED team, plan discussed with patient and also discussed with Dr. Bunny Caroli with Neuro IR over phone.  This patient is critically ill and at significant risk of neurological worsening, death and care requires constant monitoring of vital signs, hemodynamics,respiratory and cardiac monitoring, neurological assessment, discussion with family, other specialists and medical decision making of high complexity. I spent 60 minutes of neurocritical care time  in the care of  this patient. This was time spent independent of any time provided by nurse practitioner or PA.  Melizza Kanode [facility] 04/29/2024  1:31 PM  Lynnette Pote, MD [facility] [phone]   If 7pm to 7am, please call on call as listed on AMION.

## 2024-04-29 NOTE — ED Notes (Signed)
 Received call from [facility] EMS supervisor reports paramedics left pt pocketbook in a chair in the CT room and told receiving nurse.  This RN found pt bag in CT and returned to pt.

## 2024-04-30 ENCOUNTER — Observation Stay (HOSPITAL_COMMUNITY)

## 2024-04-30 ENCOUNTER — Other Ambulatory Visit (HOSPITAL_COMMUNITY): Payer: Self-pay

## 2024-04-30 DIAGNOSIS — E1122 Type 2 diabetes mellitus with diabetic chronic kidney disease: Secondary | ICD-10-CM | POA: Diagnosis not present

## 2024-04-30 DIAGNOSIS — I6523 Occlusion and stenosis of bilateral carotid arteries: Secondary | ICD-10-CM | POA: Diagnosis not present

## 2024-04-30 DIAGNOSIS — I509 Heart failure, unspecified: Secondary | ICD-10-CM

## 2024-04-30 DIAGNOSIS — Z992 Dependence on renal dialysis: Secondary | ICD-10-CM

## 2024-04-30 DIAGNOSIS — I132 Hypertensive heart and chronic kidney disease with heart failure and with stage 5 chronic kidney disease, or end stage renal disease: Secondary | ICD-10-CM

## 2024-04-30 DIAGNOSIS — G459 Transient cerebral ischemic attack, unspecified: Secondary | ICD-10-CM | POA: Diagnosis not present

## 2024-04-30 DIAGNOSIS — N186 End stage renal disease: Secondary | ICD-10-CM

## 2024-04-30 DIAGNOSIS — E785 Hyperlipidemia, unspecified: Secondary | ICD-10-CM

## 2024-04-30 DIAGNOSIS — I639 Cerebral infarction, unspecified: Secondary | ICD-10-CM | POA: Diagnosis not present

## 2024-04-30 DIAGNOSIS — Z794 Long term (current) use of insulin: Secondary | ICD-10-CM

## 2024-04-30 DIAGNOSIS — I671 Cerebral aneurysm, nonruptured: Secondary | ICD-10-CM

## 2024-04-30 LAB — CBC
HCT: 31.3 % — ABNORMAL LOW (ref 36.0–46.0)
Hemoglobin: 10.1 g/dL — ABNORMAL LOW (ref 12.0–15.0)
MCH: 29.6 pg (ref 26.0–34.0)
MCHC: 32.3 g/dL (ref 30.0–36.0)
MCV: 91.8 fL (ref 80.0–100.0)
Platelets: 278 10*3/uL (ref 150–400)
RBC: 3.41 MIL/uL — ABNORMAL LOW (ref 3.87–5.11)
RDW: 14.3 % (ref 11.5–15.5)
WBC: 9.4 10*3/uL (ref 4.0–10.5)
nRBC: 0 % (ref 0.0–0.2)

## 2024-04-30 LAB — COMPREHENSIVE METABOLIC PANEL WITH GFR
ALT: 10 U/L (ref 0–44)
AST: 11 U/L — ABNORMAL LOW (ref 15–41)
Albumin: 3.3 g/dL — ABNORMAL LOW (ref 3.5–5.0)
Alkaline Phosphatase: 79 U/L (ref 38–126)
Anion gap: 16 — ABNORMAL HIGH (ref 5–15)
BUN: 67 mg/dL — ABNORMAL HIGH (ref 8–23)
CO2: 23 mmol/L (ref 22–32)
Calcium: 8.6 mg/dL — ABNORMAL LOW (ref 8.9–10.3)
Chloride: 94 mmol/L — ABNORMAL LOW (ref 98–111)
Creatinine, Ser: 9.54 mg/dL — ABNORMAL HIGH (ref 0.44–1.00)
GFR, Estimated: 4 mL/min — ABNORMAL LOW (ref >60)
Glucose, Bld: 205 mg/dL — ABNORMAL HIGH (ref 70–99)
Potassium: 4.9 mmol/L (ref 3.5–5.1)
Sodium: 133 mmol/L — ABNORMAL LOW (ref 135–145)
Total Bilirubin: 0.4 mg/dL (ref 0.0–1.2)
Total Protein: 7.1 g/dL (ref 6.5–8.1)

## 2024-04-30 LAB — GLUCOSE, CAPILLARY
Glucose-Capillary: 215 mg/dL — ABNORMAL HIGH (ref 70–99)
Glucose-Capillary: 151 mg/dL — ABNORMAL HIGH (ref 70–99)
Glucose-Capillary: 108 mg/dL — ABNORMAL HIGH (ref 70–99)

## 2024-04-30 LAB — PROTIME-INR
INR: 1.1 (ref 0.8–1.2)
Prothrombin Time: 14.1 s (ref 11.4–15.2)

## 2024-04-30 MED ORDER — HYDROMORPHONE HCL 1 MG/ML IJ SOLN
INTRAMUSCULAR | Status: AC
  Filled 2024-04-30: qty 0.5

## 2024-04-30 MED ORDER — HYDROMORPHONE HCL 1 MG/ML IJ SOLN
0.5000 mg | INTRAMUSCULAR | Status: AC | PRN
  Administered 2024-04-30 (×2): 0.5 mg via INTRAVENOUS
  Filled 2024-04-30: qty 0.5

## 2024-04-30 MED ORDER — HYDROMORPHONE HCL 1 MG/ML IJ SOLN
0.5000 mg | INTRAMUSCULAR | Status: AC | PRN
  Administered 2024-04-30 – 2024-05-01 (×2): 0.5 mg via INTRAVENOUS
  Administered 2024-05-01: 0.25 mg via INTRAVENOUS
  Administered 2024-05-01: 0.5 mg via INTRAVENOUS
  Administered 2024-05-01: 0.25 mg via INTRAVENOUS
  Administered 2024-05-01: 0.5 mg via INTRAVENOUS
  Filled 2024-04-30 (×2): qty 1
  Filled 2024-04-30 (×3): qty 0.5
  Filled 2024-04-30: qty 1

## 2024-04-30 NOTE — Anesthesia Preprocedure Evaluation (Signed)
 Anesthesia Evaluation  Patient identified by MRN, date of birth, ID band Patient awake    Reviewed: Allergy & Precautions, NPO status , Patient's Chart, lab work & pertinent test results, reviewed documented beta blocker date and time   History of Anesthesia Complications Negative for: history of anesthetic complications  Airway Mallampati: II  TM Distance: >3 FB Neck ROM: Full    Dental  (+) Partial Lower, Edentulous Upper   Pulmonary asthma , sleep apnea , COPD,  COPD inhaler, former smoker   Pulmonary exam normal        Cardiovascular hypertension, Pt. on medications and Pt. on home beta blockers + CAD, + Past MI, + CABG (2018) and +CHF  Normal cardiovascular exam  Right carotid stenosis  TTE 04/25/24: EF 35-40%, grade 2 DD, mild RV dysfunction, moderate pHTN (RVSP ), moderate LAE/RAE, mild to moderate MR, moderate TR    Neuro/Psych  Headaches  Anxiety Depression    CVA    GI/Hepatic Neg liver ROS,GERD  ,,  Endo/Other  diabetes, Type 2, Insulin  Dependent    Renal/GU ESRF and DialysisRenal disease  negative genitourinary   Musculoskeletal negative musculoskeletal ROS (+)    Abdominal   Peds  Hematology  (+) Blood dyscrasia (Hgb 10.1), anemia   Anesthesia Other Findings Day of surgery medications reviewed with patient.  Reproductive/Obstetrics negative OB ROS                              Anesthesia Physical Anesthesia Plan  ASA: 4  Anesthesia Plan: General   Post-op Pain Management: Tylenol  PO (pre-op)*   Induction: Intravenous  PONV Risk Score and Plan: 3 and Treatment may vary due to age or medical condition, Dexamethasone  and Ondansetron   Airway Management Planned: Oral ETT  Additional Equipment: Arterial line  Intra-op Plan:   Post-operative Plan: Extubation in OR  Informed Consent: I have reviewed the patients History and Physical, chart, labs and discussed the  procedure including the risks, benefits and alternatives for the proposed anesthesia with the patient or authorized representative who has indicated his/her understanding and acceptance.     Dental advisory given  Plan Discussed with: CRNA  Anesthesia Plan Comments:         Anesthesia Quick Evaluation

## 2024-04-30 NOTE — Progress Notes (Signed)
 Carotid artery duplex has been completed. Preliminary results can be found in CV Proc through chart review.   04/30/24 11:28 AM Birda Buffy RVT

## 2024-04-30 NOTE — Consult Note (Signed)
 Chief Complaint: Patient was seen in consultation today for right internal carotid artery stenosis   Supervising Physician: Luellen Sages  Patient Status: [facility] - In-pt  Full Code  History of Present Illness: Terry Arroyo is a 63 y.o. female with history of diabetes, hypertension, hyperlipidemia, GERD, CAD-s/p CABG, anemia, CHF, ESRD-on dialysis, brain aneurysm, depression, DVT, pituitary adenoma, sleep apnea and CVA 5/5-s/p left internal carotid stent placement. Patient presented to the emergency room 5/11 with left sided weakness and slurred speech. Code stroke activated, MRI and CT head did not show evidence of acute CVA. However, MRI noted right greater than left intracranial ICA atherosclerosis and stenosis, similar to prior CTA. MR angio head and neck performed 04/29/24, impression below.    MR angio neck 04/29/24   MR head 04/29/24    Neurology saw the patient in consult 5/11 and recommended admission for TIA workup. Neuro IR consulted by neurology to discuss patient's care. Patient reports today that her symptoms improved and she does not have weakness/numbness. She reports taking her medications as prescribed at home.   Past Medical History:  Diagnosis Date   ACS (acute coronary syndrome) (HCC) 03/24/2019   Acute ischemic left MCA stroke (HCC) 04/23/2024   Acute on chronic systolic CHF (congestive heart failure) (HCC) 02/26/2020   Acute pain of left thigh 02/09/2022   Acute pulmonary edema (HCC)    Acute renal failure superimposed on stage 4 chronic kidney disease (HCC)    Anasarca 02/26/2020   Anemia    Angina at rest [facility])    Angioedema 03/20/2021   Lisinopril    Anterior cerebral aneurysm    approximately 5 mm; left MCA    Anxiety    Aortic atherosclerosis (HCC)    Asthma    Benign hypertensive kidney disease with chronic kidney disease 03/10/2020   Blind left eye    CHF (congestive heart failure) (HCC)    COPD (chronic obstructive pulmonary disease)  (HCC)    COPD exacerbation (HCC) 10/10/2020   Coronary artery disease    COVID-19 virus infection 03/24/2019   Depression    DOE (dyspnea on exertion)    DVT (deep venous thrombosis) (HCC) 12/30/2016   Left leg dvt as per pt while she was in hospital for cabg  Is on eliquis since august  Doppler done in [place]  Shows   acute  occlusive deep vein  thrombosis  noted in the soleal  vein (a short  segment) at the  mid calf  3/18 doppler is negative     Last Assessment & Plan:   Will rpt doppler   If negative will stop eliquis  Formatting of this note might be different from the original.  Left leg dv   ESRD (end stage renal disease) (HCC)    Gait instability    Genital herpes    GERD (gastroesophageal reflux disease)    HFrEF (heart failure with reduced ejection fraction) (HCC)    History of 2019 novel coronavirus disease (COVID-19) 03/2019   [facility] [place]    History of DVT (deep vein thrombosis)    History of DVT (deep vein thrombosis)    Hx of CABG 2018   x 4; performed in [place]    Hyperlipidemia    Hypertension    Hypertensive urgency 11/24/2019   MI (myocardial infarction) (HCC) 2018   no stents   Pituitary adenoma (HCC)    PVD (posterior vitreous detachment), right eye    Secondary hyperparathyroidism (HCC)    Sepsis (  HCC) 09/05/2021   Sleep apnea    non-compliant with nocturnal PAP therapy   Stroke [facility]) 2014   stated affected left side.   T2DM (type 2 diabetes mellitus) [facility])     Past Surgical History:  Procedure Laterality Date   A/V FISTULAGRAM Right 12/29/2021   Procedure: A/V FISTULAGRAM;  Surgeon: Jackquelyn Mass, MD;  Location: [facility] INVASIVE CV LAB;  Service: Cardiovascular;  Laterality: Right;   A/V FISTULAGRAM Right 06/15/2022   Procedure: A/V Fistulagram;  Surgeon: Jackquelyn Mass, MD;  Location: [facility] INVASIVE CV LAB;  Service: Cardiovascular;  Laterality: Right;   A/V FISTULAGRAM Right 04/17/2024   Procedure: A/V Fistulagram;  Surgeon:  Jackquelyn Mass, MD;  Location: [facility] INVASIVE CV LAB;  Service: Cardiovascular;  Laterality: Right;   AV FISTULA PLACEMENT Right 01/28/2021   Procedure: ARTERIOVENOUS (AV) FISTULA CREATION;  Surgeon: Jackquelyn Mass, MD;  Location: [facility] ORS;  Service: Vascular;  Laterality: Right;   AV FISTULA PLACEMENT Right 05/13/2021   Procedure: INSERTION OF ARTERIOVENOUS (AV) GORE-TEX GRAFT ARM ( BRACHIAL AXILLARY );  Surgeon: Jackquelyn Mass, MD;  Location: [facility] ORS;  Service: Vascular;  Laterality: Right;   BRAIN SURGERY  1986   pituitary tumor   BREAST EXCISIONAL BIOPSY Left yrs ago   benign   CESAREAN SECTION     COLONOSCOPY N/A 05/26/2023   Procedure: COLONOSCOPY;  Surgeon: Quintin Buckle, DO;  Location: [facility] ENDOSCOPY;  Service: Gastroenterology;  Laterality: N/A;  DIALYSIS   CORONARY ARTERY BYPASS GRAFT  2018   x 4 vessels; performed in [place]    DIALYSIS/PERMA CATHETER INSERTION N/A 11/07/2020   Procedure: DIALYSIS/PERMA CATHETER INSERTION;  Surgeon: Celso College, MD;  Location: [facility] INVASIVE CV LAB;  Service: Cardiovascular;  Laterality: N/A;   DIALYSIS/PERMA CATHETER REMOVAL N/A 06/12/2021   Procedure: DIALYSIS/PERMA CATHETER REMOVAL;  Surgeon: Celso College, MD;  Location: [facility] INVASIVE CV LAB;  Service: Cardiovascular;  Laterality: N/A;   ESOPHAGOGASTRODUODENOSCOPY (EGD) WITH PROPOFOL  N/A 12/22/2021   Procedure: ESOPHAGOGASTRODUODENOSCOPY (EGD) WITH PROPOFOL ;  Surgeon: Shane Darling, MD;  Location: [facility] ENDOSCOPY;  Service: Gastroenterology;  Laterality: N/A;  IDDM   EYE SURGERY Right 2021   cataracts    IR CT HEAD LTD  04/23/2024   IR PERCUTANEOUS ART THROMBECTOMY/INFUSION INTRACRANIAL INC DIAG ANGIO  04/23/2024   IR US  GUIDE VASC ACCESS RIGHT  04/23/2024   PERIPHERAL VASCULAR THROMBECTOMY Right 04/03/2021   Procedure: A/V Fistulagram ;  Surgeon: Jackquelyn Mass, MD;  Location: [facility] INVASIVE CV LAB;  Service: Cardiovascular;  Laterality: Right;   RADIOLOGY WITH ANESTHESIA N/A  04/23/2024   Procedure: RADIOLOGY WITH ANESTHESIA;  Surgeon: Radiologist, Medication, MD;  Location: [facility] OR;  Service: Radiology;  Laterality: N/A;    Allergies: Hydralazine , Kiwi extract, Lisinopril , Shellfish allergy, Betadine [povidone iodine ], Septra [sulfamethoxazole-trimethoprim], Metformin, and Tape  Medications: Prior to Admission medications   Medication Sig Start Date End Date Taking? Authorizing Provider  acetaminophen  (TYLENOL ) 500 MG tablet Take 1,000 mg by mouth every 6 (six) hours as needed for mild pain (pain score 1-3) or moderate pain (pain score 4-6).   Yes [provider]  albuterol  (VENTOLIN  HFA) 108 (90 Base) MCG/ACT inhaler Inhale 2 puffs into the lungs every 6 (six) hours as needed for wheezing or shortness of breath.   Yes [provider]  amLODipine  (NORVASC ) 10 MG tablet Take 1 tablet (10 mg total) by mouth daily. 03/03/20  Yes Luna Salinas, MD  aspirin  81 MG EC tablet Take 1 tablet (81 mg  total) by mouth daily. 02/10/21  Yes Brenna Cam, MD  atorvastatin  (LIPITOR ) 80 MG tablet Take 1 tablet (80 mg total) by mouth daily at 6 PM. 11/26/19  Yes Patel, Pranav M, MD  B Complex-C-Folic Acid  (RENA-VITE RX) 1 MG TABS Take 1 tablet by mouth daily. 03/17/23  Yes [provider]  carvedilol  (COREG ) 25 MG tablet Take 25 mg by mouth 2 (two) times daily.   Yes [provider]  gabapentin  (NEURONTIN ) 300 MG capsule Take 300 mg by mouth at bedtime.   Yes [provider]  lidocaine -prilocaine  (EMLA ) cream Apply 1 Application topically every Monday, Wednesday, and Friday with hemodialysis.   Yes [provider]  mirtazapine  (REMERON ) 30 MG tablet Take 30 mg by mouth at bedtime.   Yes [provider]  NOVOLOG  MIX 70/30 FLEXPEN (70-30) 100 UNIT/ML FlexPen Inject 7-15 Units into the skin 3 (three) times daily with meals.   Yes [provider]  topiramate  (TOPAMAX ) 50 MG tablet Take 50 mg by mouth daily. 11/16/23  Yes  [provider]  traZODone  (DESYREL ) 50 MG tablet Take 0.5 tablets (25 mg total) by mouth at bedtime. Patient taking differently: Take 50 mg by mouth at bedtime. 04/16/22  Yes Ree Candy, MD  vitamin B-12 (CYANOCOBALAMIN ) 1000 MCG tablet Take 1,000 mcg by mouth daily.   Yes [provider]  ezetimibe  (ZETIA ) 10 MG tablet Take 1 tablet (10 mg total) by mouth daily. Patient not taking: Reported on 04/29/2024 04/29/24   de Thayne Fine, Cortney E, NP  ticagrelor  (BRILINTA ) 90 MG TABS tablet Take 1 tablet (90 mg total) by mouth 2 (two) times daily. Patient not taking: Reported on 04/29/2024 04/28/24   de Greer Leak, NP     Family History  Problem Relation Age of Onset   CAD Mother    CAD Brother    Breast cancer Sister 34    Social History   Socioeconomic History   Marital status: Single    Spouse name: Not on file   Number of children: Not on file   Years of education: Not on file   Highest education level: Not on file  Occupational History    Comment: disabled  Tobacco Use   Smoking status: Former    Current packs/day: 0.00    Types: Cigarettes    Quit date: 2018    Years since quitting: 7.3   Smokeless tobacco: Never  Vaping Use   Vaping status: Never Used  Substance and Sexual Activity   Alcohol  use: Not Currently   Drug use: Not Currently   Sexual activity: Not Currently  Other Topics Concern   Not on file  Social History Narrative   Patient lives alone.  Feels safe in her home.   Social Drivers of Corporate investment banker Strain: Low Risk  (10/13/2023)   Received from [facility]   Overall Financial Resource Strain (CARDIA)    Difficulty of Paying Living Expenses: Not very hard  Food Insecurity: No Food Insecurity (04/29/2024)   Hunger Vital Sign    Worried About Running Out of Food in the Last Year: Never true    Ran Out of Food in the Last Year: Never true  Transportation Needs: No Transportation Needs  (04/29/2024)   PRAPARE - Administrator, Civil Service (Medical): No    Lack of Transportation (Non-Medical): No  Physical Activity: Not on file  Stress: Not on file  Social Connections: Unknown (04/29/2024)  Social Advertising account executive [NHANES]    Frequency of Communication with Friends and Family: Twice a week    Frequency of Social Gatherings with Friends and Family: Twice a week    Attends Religious Services: 1 to 4 times per year    Active Member of [employer] or Organizations: No    Attends Banker Meetings: Never    Marital Status: Patient declined     Review of Systems: A 12 point ROS discussed and pertinent positives are indicated in the HPI above.  All other systems are negative.  Review of Systems  Constitutional:  Negative for fever.  Respiratory:  Negative for cough.   Cardiovascular:  Negative for chest pain.  Neurological:  Negative for weakness and numbness.    Vital Signs: BP (!) 150/29 (BP Location: Left Leg)   Pulse (!) 59   Temp 97.8 F (36.6 C) (Oral)   Resp 18   Ht 5\' 6"  (1.676 m)   Wt 170 lb 3.1 oz (77.2 kg)   SpO2 100%   BMI 27.47 kg/m     Physical Exam HENT:     Head: Normocephalic and atraumatic.  Cardiovascular:     Rate and Rhythm: Normal rate and regular rhythm.  Pulmonary:     Effort: Pulmonary effort is normal.  Skin:    General: Skin is warm.  Neurological:     Mental Status: She is alert and oriented to person, place, and time.     Cranial Nerves: No dysarthria or facial asymmetry.     Comments: Grip strength intact bilaterally.     Imaging: DG Elbow 2 Views Left Result Date: 04/29/2024 CLINICAL DATA:  Pain elbow pain EXAM: LEFT ELBOW - 2 VIEW COMPARISON:  CTA chest, abdomen, and pelvis dated 04/23/2024 FINDINGS: Ill-defined density dispersed through the subcutaneous soft tissues of the partially imaged upper arm overlying the ulnar aspect of the humerus. No acute dislocation. Subtle cortical  angulation at the level of the radial neck. Moderate elbow joint effusion. IMPRESSION: 1. Subtle cortical angulation at the level of the radial neck, which may represent a nondisplaced fracture in the appropriate clinical setting. 2. Moderate elbow joint effusion. 3. Ill-defined density dispersed through the subcutaneous soft tissues of the partially imaged upper arm overlying the ulnar aspect of the humerus, which may represent extravasated contrast material. The patient underwent a contrast-enhanced CT examination on 04/23/2024 with contrast instillation via the left upper extremity. Electronically Signed   By: Limin  Xu M.D.   On: 04/29/2024 13:22   MR BRAIN WO CONTRAST Result Date: 04/29/2024 CLINICAL DATA:  Neuro deficit, acute, stroke suspected EXAM: MRI HEAD WITHOUT CONTRAST MRA HEAD WITHOUT CONTRAST MRA NECK WITHOUT CONTRAST TECHNIQUE: Multiplanar, multiecho pulse sequences of the brain and surrounding structures were obtained without intravenous contrast. Angiographic images of the Circle of Willis were obtained using MRA technique without intravenous contrast. Angiographic images of the neck were obtained using MRA technique without intravenous contrast. Carotid stenosis measurements (when applicable) are obtained utilizing NASCET criteria, using the distal internal carotid diameter as the denominator. COMPARISON:  CTA 04/23/2024, MRI head 12/07/2023 FINDINGS: MRI HEAD Motion artifact. Brain: There is no acute infarction or intracranial hemorrhage. There is no intracranial mass, mass effect, or edema. There is no hydrocephalus or extra-axial fluid collection. Ventricles and sulci are stable in size and configuration. Patchy foci of T2 hyperintensity in the supratentorial white matter are nonspecific but may reflect stable mild chronic microvascular ischemic changes. Vascular: Major vessel flow voids at the  skull base are preserved. Skull and upper cervical spine: Normal marrow signal is preserved.  Sinuses/Orbits: Postoperative changes with minimal patchy mucosal thickening. Right lens replacement. Other: Evidence of prior sellar mass resection minimal patchy mastoid fluid opacification. Retention cysts along the posterior wall of the nasopharynx. MRA HEAD Motion artifact. Intracranial internal carotid arteries are patent. Atherosclerotic irregularity with stenosis. There is up to marked stenosis of the right cavernous ICA and moderate marked stenosis of the left paraclinoid ICA, likely similar to prior CTA. Middle and anterior cerebral arteries are patent. 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 Intracranial vertebral arteries, basilar artery, posterior cerebral arteries are patent. Bilateral posterior communicating arteries are present. No significant stenosis. MRA NECK Motion artifact. Right common, internal, and external carotid arteries are patent. There is plaque without hemodynamically significant stenosis at the ICA origin. Subsequent circumferential narrowing of the proximal ICA causing greater than 70% stenosis. Likely no significant change from the prior CTA. Left common carotid artery is patent. There is loss of flow at the distal CCA and proximal ICA that may be related to interval stent placement. Mid to distal cervical ICA is patent. Vertebral arteries are patent. There is limited evaluation proximally due to artifact. No significant stenosis identified. IMPRESSION: Degraded by motion. No acute infarction, hemorrhage, or mass. No significant change since the prior brain MRI. Loss of flow related enhancement of the distal CCA and proximal ICA is probably related to artifact from interval stent. Greater than 70% stenosis of the proximal right ICA is probably similar to the prior CTA. Right greater than left intracranial ICA atherosclerosis and stenosis is likely similar to the prior CTA. 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 Electronically Signed   By: Geannie Keener M.D.   On: 04/29/2024 12:38   MR ANGIO HEAD WO CONTRAST Result Date: 04/29/2024 CLINICAL DATA:  Neuro deficit, acute, stroke suspected EXAM: MRI HEAD WITHOUT CONTRAST MRA HEAD WITHOUT CONTRAST MRA NECK WITHOUT CONTRAST TECHNIQUE: Multiplanar, multiecho pulse sequences of the brain and surrounding structures were obtained without intravenous contrast. Angiographic images of the Circle of Willis were obtained using MRA technique without intravenous contrast. Angiographic images of the neck were obtained using MRA technique without intravenous contrast. Carotid stenosis measurements (when applicable) are obtained utilizing NASCET criteria, using the distal internal carotid diameter as the denominator. COMPARISON:  CTA 04/23/2024, MRI head 12/07/2023 FINDINGS: MRI HEAD Motion artifact. Brain: There is no acute infarction or intracranial hemorrhage. There is no intracranial mass, mass effect, or edema. There is no hydrocephalus or extra-axial fluid collection. Ventricles and sulci are stable in size and configuration. Patchy foci of T2 hyperintensity in the supratentorial white matter are nonspecific but may reflect stable mild chronic microvascular ischemic changes. Vascular: Major vessel flow voids at the skull base are preserved. Skull and upper cervical spine: Normal marrow signal is preserved. Sinuses/Orbits: Postoperative changes with minimal patchy mucosal thickening. Right lens replacement. Other: Evidence of prior sellar mass resection minimal patchy mastoid fluid opacification. Retention cysts along the posterior wall of the nasopharynx. MRA HEAD Motion artifact. Intracranial internal carotid arteries are patent. Atherosclerotic irregularity with stenosis. There is up to marked stenosis of the right cavernous ICA and moderate marked stenosis of the left paraclinoid ICA, likely similar to prior CTA. Middle and anterior cerebral arteries are patent. 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  Intracranial vertebral arteries, basilar artery, posterior cerebral arteries are patent. Bilateral posterior communicating arteries are present. No significant stenosis. MRA NECK Motion artifact.  Right common, internal, and external carotid arteries are patent. There is plaque without hemodynamically significant stenosis at the ICA origin. Subsequent circumferential narrowing of the proximal ICA causing greater than 70% stenosis. Likely no significant change from the prior CTA. Left common carotid artery is patent. There is loss of flow at the distal CCA and proximal ICA that may be related to interval stent placement. Mid to distal cervical ICA is patent. Vertebral arteries are patent. There is limited evaluation proximally due to artifact. No significant stenosis identified. IMPRESSION: Degraded by motion. No acute infarction, hemorrhage, or mass. No significant change since the prior brain MRI. Loss of flow related enhancement of the distal CCA and proximal ICA is probably related to artifact from interval stent. Greater than 70% stenosis of the proximal right ICA is probably similar to the prior CTA. Right greater than left intracranial ICA atherosclerosis and stenosis is likely similar to the prior CTA. 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 Electronically Signed   By: Geannie Keener M.D.   On: 04/29/2024 12:38   MR ANGIO NECK WO CONTRAST Result Date: 04/29/2024 CLINICAL DATA:  Neuro deficit, acute, stroke suspected EXAM: MRI HEAD WITHOUT CONTRAST MRA HEAD WITHOUT CONTRAST MRA NECK WITHOUT CONTRAST TECHNIQUE: Multiplanar, multiecho pulse sequences of the brain and surrounding structures were obtained without intravenous contrast. Angiographic images of the Circle of Willis were obtained using MRA technique without intravenous contrast. Angiographic images of the neck were obtained using MRA technique without intravenous contrast. Carotid stenosis measurements (when applicable) are  obtained utilizing NASCET criteria, using the distal internal carotid diameter as the denominator. COMPARISON:  CTA 04/23/2024, MRI head 12/07/2023 FINDINGS: MRI HEAD Motion artifact. Brain: There is no acute infarction or intracranial hemorrhage. There is no intracranial mass, mass effect, or edema. There is no hydrocephalus or extra-axial fluid collection. Ventricles and sulci are stable in size and configuration. Patchy foci of T2 hyperintensity in the supratentorial white matter are nonspecific but may reflect stable mild chronic microvascular ischemic changes. Vascular: Major vessel flow voids at the skull base are preserved. Skull and upper cervical spine: Normal marrow signal is preserved. Sinuses/Orbits: Postoperative changes with minimal patchy mucosal thickening. Right lens replacement. Other: Evidence of prior sellar mass resection minimal patchy mastoid fluid opacification. Retention cysts along the posterior wall of the nasopharynx. MRA HEAD Motion artifact. Intracranial internal carotid arteries are patent. Atherosclerotic irregularity with stenosis. There is up to marked stenosis of the right cavernous ICA and moderate marked stenosis of the left paraclinoid ICA, likely similar to prior CTA. Middle and anterior cerebral arteries are patent. 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 Intracranial vertebral arteries, basilar artery, posterior cerebral arteries are patent. Bilateral posterior communicating arteries are present. No significant stenosis. MRA NECK Motion artifact. Right common, internal, and external carotid arteries are patent. There is plaque without hemodynamically significant stenosis at the ICA origin. Subsequent circumferential narrowing of the proximal ICA causing greater than 70% stenosis. Likely no significant change from the prior CTA. Left common carotid artery is patent. There is loss of flow at the distal CCA and proximal ICA that may be related to interval stent  placement. Mid to distal cervical ICA is patent. Vertebral arteries are patent. There is limited evaluation proximally due to artifact. No significant stenosis identified. IMPRESSION: Degraded by motion. No acute infarction, hemorrhage, or mass. No significant change since the prior brain MRI. Loss of flow related enhancement of the distal CCA and proximal ICA is probably related to artifact  from interval stent. Greater than 70% stenosis of the proximal right ICA is probably similar to the prior CTA. Right greater than left intracranial ICA atherosclerosis and stenosis is likely similar to the prior CTA. 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 Electronically Signed   By: Geannie Keener M.D.   On: 04/29/2024 12:38   CT HEAD CODE STROKE WO CONTRAST Result Date: 04/29/2024 CLINICAL DATA:  Code stroke.  Left-sided weakness EXAM: CT HEAD WITHOUT CONTRAST TECHNIQUE: Contiguous axial images were obtained from the base of the skull through the vertex without intravenous contrast. RADIATION DOSE REDUCTION: This exam was performed according to the departmental dose-optimization program which includes automated exposure control, adjustment of the mA and/or kV according to patient size and/or use of iterative reconstruction technique. COMPARISON:  Six days prior FINDINGS: Brain: No evidence of acute infarction, hemorrhage, hydrocephalus, extra-axial collection or mass lesion/mass effect. Extensive dural calcification Vascular: No hyperdense vessel. Scattered areas of calcification within the superior sagittal and left transverse sigmoid dural sinuses. Skull: Normal. Negative for fracture or focal lesion. Sinuses/Orbits: Chronic sinusitis with prior endoscopic sinus surgery Other: Prelim sent in epic chat ASPECTS [facility] Stroke Program Early CT Score) - Ganglionic level infarction (caudate, lentiform nuclei, internal capsule, insula, M1-M3 cortex): 7 - Supraganglionic infarction (M4-M6 cortex): 3 Total  score (0-10 with 10 being normal): 10 IMPRESSION: No acute or interval finding. Electronically Signed   By: Ronnette Coke M.D.   On: 04/29/2024 11:08   ECHOCARDIOGRAM COMPLETE Result Date: 04/25/2024    ECHOCARDIOGRAM REPORT   Patient Name:   Terry Arroyo Date of Exam: 04/25/2024 Medical Rec #:  [id number]       Height:       66.0 in Accession #:    [id number]      Weight:       162.0 lb Date of Birth:  09-21-1961        BSA:          1.829 m Patient Age:    62 years        BP:           154/61 mmHg Patient Gender: F               HR:           63 bpm. Exam Location:  Inpatient Procedure: 2D Echo, Color Doppler and Cardiac Doppler (Both Spectral and Color            Flow Doppler were utilized during procedure). Indications:    Stroke  History:        Patient has prior history of Echocardiogram examinations, most                 recent 12/08/2023.  Sonographer:    Andrena Bang Referring Phys: Leandro Proffer DE LA TORRE IMPRESSIONS  1. Left ventricular ejection fraction, by estimation, is 35 to 40%. The left ventricle has moderately decreased function. The left ventricle demonstrates regional wall motion abnormalities (see scoring diagram/findings for description). Left ventricular  diastolic parameters are consistent with Grade II diastolic dysfunction (pseudonormalization).  2. Right ventricular systolic function is mildly reduced. The right ventricular size is moderately enlarged. There is moderately elevated pulmonary artery systolic pressure. The estimated right ventricular systolic pressure is 50.0 mmHg.  3. Left atrial size was moderately dilated.  4. Right atrial size was moderately dilated.  5. The mitral valve is normal in structure. Mild to moderate mitral valve regurgitation. No evidence of mitral stenosis.  Moderate mitral annular calcification.  6. Tricuspid valve regurgitation is moderate.  7. The aortic valve is calcified. There is mild calcification of the aortic valve. There is mild thickening of the  aortic valve. Aortic valve regurgitation is not visualized. No aortic stenosis is present. Aortic valve mean gradient measures 6.0 mmHg. Aortic valve Vmax measures 1.81 m/s.  8. The inferior vena cava is dilated in size with >50% respiratory variability, suggesting right atrial pressure of 8 mmHg. Conclusion(s)/Recommendation(s): No intracardiac source of embolism detected on this transthoracic study. Consider a transesophageal echocardiogram to exclude cardiac source of embolism if clinically indicated. FINDINGS  Left Ventricle: Left ventricular ejection fraction, by estimation, is 35 to 40%. The left ventricle has moderately decreased function. The left ventricle demonstrates regional wall motion abnormalities. The left ventricular internal cavity size was normal in size. There is no left ventricular hypertrophy. Left ventricular diastolic parameters are consistent with Grade II diastolic dysfunction (pseudonormalization).  LV Wall Scoring: The inferior wall and posterior wall are akinetic. Right Ventricle: The right ventricular size is moderately enlarged. No increase in right ventricular wall thickness. Right ventricular systolic function is mildly reduced. There is moderately elevated pulmonary artery systolic pressure. The tricuspid regurgitant velocity is 3.24 m/s, and with an assumed right atrial pressure of 8 mmHg, the estimated right ventricular systolic pressure is 50.0 mmHg. Left Atrium: Left atrial size was moderately dilated. Right Atrium: Right atrial size was moderately dilated. Pericardium: There is no evidence of pericardial effusion. Mitral Valve: The mitral valve is normal in structure. Moderate mitral annular calcification. Mild to moderate mitral valve regurgitation. No evidence of mitral valve stenosis. MV peak gradient, 9.0 mmHg. The mean mitral valve gradient is 3.0 mmHg. Tricuspid Valve: The tricuspid valve is normal in structure. Tricuspid valve regurgitation is moderate . No evidence of  tricuspid stenosis. Aortic Valve: The aortic valve is calcified. There is mild calcification of the aortic valve. There is mild thickening of the aortic valve. Aortic valve regurgitation is not visualized. No aortic stenosis is present. Aortic valve mean gradient measures 6.0 mmHg. Aortic valve peak gradient measures 13.1 mmHg. Aortic valve area, by VTI measures 1.21 cm. Pulmonic Valve: The pulmonic valve was normal in structure. Pulmonic valve regurgitation is not visualized. No evidence of pulmonic stenosis. Aorta: The aortic root is normal in size and structure. Venous: The inferior vena cava is dilated in size with greater than 50% respiratory variability, suggesting right atrial pressure of 8 mmHg. IAS/Shunts: No atrial level shunt detected by color flow Doppler.  LEFT VENTRICLE PLAX 2D LVIDd:         6.00 cm      Diastology LVIDs:         5.10 cm      LV e' medial:    4.24 cm/s LV PW:         0.85 cm      LV E/e' medial:  31.6 LV IVS:        1.15 cm      LV e' lateral:   7.18 cm/s LVOT diam:     1.70 cm      LV E/e' lateral: 18.7 LV SV:         48 LV SV Index:   26 LVOT Area:     2.27 cm  LV Volumes (MOD) LV vol d, MOD A2C: 152.0 ml LV vol d, MOD A4C: 143.0 ml LV vol s, MOD A2C: 82.1 ml LV vol s, MOD A4C: 92.2 ml LV SV MOD A2C:  69.9 ml LV SV MOD A4C:     143.0 ml LV SV MOD BP:      62.8 ml RIGHT VENTRICLE RV S prime:     6.31 cm/s TAPSE (M-mode): 1.1 cm LEFT ATRIUM           Index LA diam:      4.00 cm 2.19 cm/m LA Vol (A2C): 78.2 ml 42.76 ml/m LA Vol (A4C): 44.5 ml 24.33 ml/m  AORTIC VALVE AV Area (Vmax):    1.14 cm AV Area (Vmean):   1.13 cm AV Area (VTI):     1.21 cm AV Vmax:           181.00 cm/s AV Vmean:          111.000 cm/s AV VTI:            0.401 m AV Peak Grad:      13.1 mmHg AV Mean Grad:      6.0 mmHg LVOT Vmax:         90.80 cm/s LVOT Vmean:        55.200 cm/s LVOT VTI:          0.213 m LVOT/AV VTI ratio: 0.53  AORTA Ao Asc diam: 3.10 cm MITRAL VALVE                TRICUSPID VALVE MV  Area (PHT): 5.27 cm     TV Peak grad:   42.0 mmHg MV Area VTI:   1.18 cm     TV Vmax:        3.24 m/s MV Peak grad:  9.0 mmHg     TR Peak grad:   42.0 mmHg MV Mean grad:  3.0 mmHg     TR Vmax:        324.00 cm/s MV Vmax:       1.50 m/s MV Vmean:      75.5 cm/s    SHUNTS MV Decel Time: 144 msec     Systemic VTI:  0.21 m MV E velocity: 134.00 cm/s  Systemic Diam: 1.70 cm MV A velocity: 89.40 cm/s MV E/A ratio:  1.50 Dorothye Gathers MD Electronically signed by Dorothye Gathers MD Signature Date/Time: 04/25/2024/3:19:00 PM    Final    VAS US  CAROTID Result Date: 04/25/2024 Carotid Arterial Duplex Study Patient Name:  Terry Arroyo  Date of Exam:   04/25/2024 Medical Rec #: [id number]        Accession #:    [id number] Date of Birth: 1960-12-25         Patient Gender: F Patient Age:   45 years Exam Location:  [facility] Procedure:      VAS US  CAROTID Referring Phys: Margart Shears DE LA TORRE --------------------------------------------------------------------------------  Indications:       CVA. Risk Factors:      Hypertension, hyperlipidemia, prior MI, coronary artery                    disease, prior CVA, PAD. Comparison Study:  04/23/2024 CTA neck- 1. Negative for large vessel occlusion,                    But Positive for VERY SEVERE underlying atherosclerosis with:                    - LICA origin and bilateral ICA siphon RADIOGRAPHIC STRING                    SIGN  STENOSES.                    - Right ICA distal bulb high-grade stenosis, estimated 70-80%                    and                    approaching a radiographic string sign. Performing Technologist: Delford Felling MHA, RDMS, RVT, RDCS  Examination Guidelines: A complete evaluation includes B-mode imaging, spectral Doppler, color Doppler, and power Doppler as needed of all accessible portions of each vessel. Bilateral testing is considered an integral part of a complete examination. Limited examinations for reoccurring indications may be performed as noted.   Right Carotid Findings: +----------+--------+--------+--------+-----------------------+--------+           PSV cm/sEDV cm/sStenosisPlaque Description     Comments +----------+--------+--------+--------+-----------------------+--------+ CCA Prox  123     14                                              +----------+--------+--------+--------+-----------------------+--------+ CCA Distal79      8               heterogenous and smooth         +----------+--------+--------+--------+-----------------------+--------+ ICA Prox  170     36      1-39%   smooth and heterogenous         +----------+--------+--------+--------+-----------------------+--------+ ICA Mid   126     24                                              +----------+--------+--------+--------+-----------------------+--------+ ICA Distal70      12                                              +----------+--------+--------+--------+-----------------------+--------+ ECA       116                                                     +----------+--------+--------+--------+-----------------------+--------+ +----------+--------+-------+--------+--------------------+           PSV cm/sEDV cmsDescribeArm Pressure (mmHG)  +----------+--------+-------+--------+--------------------+ [id number]            StenoticRestricted extremity +----------+--------+-------+--------+--------------------+ +---------+--------+--+--------+-+---------+ VertebralPSV cm/s34EDV cm/s4Antegrade +---------+--------+--+--------+-+---------+  Left Carotid Findings: +----------+--------+--------+--------+------------------+--------+           PSV cm/sEDV cm/sStenosisPlaque DescriptionComments +----------+--------+--------+--------+------------------+--------+ CCA Prox  77      9                                          +----------+--------+--------+--------+------------------+--------+ CCA Distal                                           stent    +----------+--------+--------+--------+------------------+--------+  ICA Prox                                            stent    +----------+--------+--------+--------+------------------+--------+ ICA Mid                                             stent    +----------+--------+--------+--------+------------------+--------+ ICA Distal104     24                                         +----------+--------+--------+--------+------------------+--------+ ECA       287     13                                         +----------+--------+--------+--------+------------------+--------+ +----------+--------+--------+----------------+-------------------+           PSV cm/sEDV cm/sDescribe        Arm Pressure (mmHG) +----------+--------+--------+----------------+-------------------+ [id number]              Multiphasic, [id number]                 +----------+--------+--------+----------------+-------------------+ +---------+--------+---+--------+--+---------+ VertebralPSV cm/s101EDV cm/s23Antegrade +---------+--------+---+--------+--+---------+  Left Stent(s): +---------------+--------+--------+--------+--------+--------+ ICA            PSV cm/sEDV cm/sStenosisWaveformComments +---------------+--------+--------+--------+--------+--------+ Prox to Stent  96      17                               +---------------+--------+--------+--------+--------+--------+ Proximal Stent 82      12                               +---------------+--------+--------+--------+--------+--------+ Mid Stent      227     27                               +---------------+--------+--------+--------+--------+--------+ Distal Stent   101     24                               +---------------+--------+--------+--------+--------+--------+ Distal to Stent104     24                                +---------------+--------+--------+--------+--------+--------+    Summary: Right Carotid: Velocities in the right ICA are consistent with an upper range                1-39% stenosis. Left Carotid: Left ICA stent is patent with elevated velocities suggestive of               50-74% stenosis. Vertebrals:  Bilateral vertebral arteries demonstrate antegrade flow. Subclavians: Right subclavian artery was stenotic. Normal flow hemodynamics were              seen in the left subclavian artery. *See table(s) above for measurements and observations.  Preliminary    EEG adult Result Date: 04/25/2024 Arleene Lack, MD     04/25/2024 10:18 AM Patient Name: Terry Arroyo MRN: [id number] Epilepsy Attending: Arleene Lack Referring Physician/Provider: Colon Dear, NP Date: 04/25/2024 Duration: 25.36 mins Patient history: 63 year old female with acute ischemic stroke getting EEG to evaluate for seizure. Level of alertness: Awake AEDs during EEG study: GBP, TPM Technical aspects: This EEG study was done with scalp electrodes positioned according to the 10-20 International system of electrode placement. Electrical activity was reviewed with band pass filter of 1-70Hz , sensitivity of 7 uV/mm, display speed of 35mm/sec with a 60Hz  notched filter applied as appropriate. EEG data were recorded continuously and digitally stored.  Video monitoring was available and reviewed as appropriate. Description: The posterior dominant rhythm consists of 8 Hz activity of moderate voltage (25-35 uV) seen predominantly in posterior head regions, symmetric and reactive to eye opening and eye closing. Hyperventilation and photic stimulation were not performed.   IMPRESSION: This study is within normal limits. No seizures or epileptiform discharges were seen throughout the recording. A normal interictal EEG does not exclude the diagnosis of epilepsy. Arleene Lack   MR BRAIN WO CONTRAST Result Date: 04/24/2024 CLINICAL DATA:   Stroke follow-up EXAM: MRI HEAD WITHOUT CONTRAST TECHNIQUE: Multiplanar, multiecho pulse sequences of the brain and surrounding structures were obtained without intravenous contrast. COMPARISON:  12/07/2023 FINDINGS: Brain: No acute infarction, hemorrhage, hydrocephalus, extra-axial collection or mass lesion. Chronic generalized dural thickening with calcification by CT. Vascular: Diffusely attenuated superior sagittal sinus with absent flow void continuing into the left sigmoid sinus, chronic partial thrombosis by prior enhanced imaging with prominent venous collaterals along the right inferior convexity. Chronic thrombus associated with calcifications. Skull and upper cervical spine: Diffusely hypointense marrow signal. No focal lesion seen. Sinuses/Orbits: Prior endoscopic sinus surgery. Right proptosis not associated with acute inflammation, stable from 2024. Nasopharyngeal retention cysts. IMPRESSION: No acute finding.  Negative for acute infarct. Chronic dural venous sinus thrombosis with collaterals. Electronically Signed   By: Ronnette Coke M.D.   On: 04/24/2024 06:03   IR PERCUTANEOUS ART THROMBECTOMY/INFUSION INTRACRANIAL INC DIAG ANGIO Result Date: 04/23/2024 PROCEDURE PERFORMED: 1. Stroke thrombectomy 2. Ultrasound vascular access 3. Cone beam CT for treatment planning COMPARISON:  CT perfusion performed Apr 23, 2024. CLINICAL DATA:  63 year old female with acute ischemic stroke with symptoms of right motor dysfunction sensory deficits and dysarthria with an NIH stroke scale measuring 14. CT imaging demonstrated a severe proximal left internal carotid artery stenosis and perfusion imaging suggested hypoperfusion to the left hemisphere. After discussing the case in a multidisciplinary fashion, and confirming that there was no immediate available decision maker, the patient was transferred for thrombectomy and emergent 2 physician consent was documented. INDICATION: Acute ischemic ischemic stroke with  left hemi severe hypoperfusion. ANESTHESIA/SEDATION: General anesthesia was utilized for the procedure. CONTRAST:  Approximately 75 cc Omnipaque  300 320 MEDICATIONS: Heparin  6000 units intravenously Brilinta  180 mg per tube FLUOROSCOPY TIME:  Fluoroscopy Time: 15.8 minutes (737 mGy). COMPLICATIONS: None immediate. BODY OF REPORT: The patient was then put under general anesthesia by the Department of Anesthesiology at [facility]. Insert ultrasound access The right groin was prepped and draped in the usual sterile fashion. Ultrasound was used to study the right common femoral artery which was patent. Using real-time ultrasound guidance, a 19 gauge introducer needle was used to access the right common femoral artery. Access was performed at 1357. A hard copy image ultrasound the same  date uncertain PACS. Using this access, a 6 French sheath was placed in the descending thoracic aorta. Approximately 5000 units of heparin  was administered intravenously. Next, selective catheterization the left common carotid artery was performed. A selective arteriogram was performed which demonstrated that the internal carotid artery was patent. But demonstrated a severe focal stenosis estimated at greater than 80% in the single frontal plane of imaging. This was use a lies as it best profiled the bifurcation between the internal carotid artery and external carotid artery. There was no evidence of intracranial filling defect. Scattered mild diffuse atherosclerotic changes are present. Next, the lesion was crossed with a catheter and guidewire. At this point, my tempted to deployed embolic protection device, however, I could not safely deployed the device in the proximal internal carotid artery secondary to tortuosity and concern for device detachment. Therefore, the proximal stenosis was treated with balloon angioplasty using a 4 mm noncompliant balloon. This was considered the first pass for the purposes of documentation and  this was performed at 14:20. Next, a precise 6 mm x 40 mm self expanding stent was deployed in the proximal left internal carotid artery extending into the common carotid artery. Deployment was completed at 14:50 and this was considered recanalization. There was a residual waist and therefore stent dilation was performed using a 5 mm noncompliant balloon. A post treatment arteriogram demonstrated no evidence of significant residual proximal stenosis. There was good apposition of the bare metal stent. There was persistent filling of the left external carotid artery. The intracranial arteries were improved in terms of caliber. There was no evidence of filling defect to suggest peripheral embolus. I elected to terminate the procedure at this point. The revascularization score was graded at TICI-3 Evaluation of the right femoral access site demonstrated at this site was suitable for closure device. A 6 French Angio-Seal device was deployed without complication. Become beam CT was then performed to evaluate for intracranial hemorrhage and treatment planning. This demonstrated no evidence significant acute intracranial hemorrhage. Fluoroscopic imaging confirmed placement of an enteric tube within the stomach. At this point, 180 mg of Brilinta  was administered per tube. The patient was then transferred to the ICU in stable condition while remaining intubated. IMPRESSION: 1. Emergent left internal carotid artery stent placement due to clinical concern for is global left-sided hypoperfusion and developing stroke in the setting of severe left internal carotid artery stenosis. PLAN: 1. To ICU for routine postoperative supportive care. 2. The patient will continue on a heparin  infusion intravenously at 500 units/hour for the next 6 hours. At that point, heparin  will be discontinued. 3. The patient has received a bolus dose of aspirin  PR and a bolus dose of 180 mg of Brilinta  per tube. Electronically Signed   By: Reagan Camera  M.D.   On: 04/23/2024 16:31   IR US  Guide Vasc Access Right Result Date: 04/23/2024 PROCEDURE PERFORMED: 1. Stroke thrombectomy 2. Ultrasound vascular access 3. Cone beam CT for treatment planning COMPARISON:  CT perfusion performed Apr 23, 2024. CLINICAL DATA:  63 year old female with acute ischemic stroke with symptoms of right motor dysfunction sensory deficits and dysarthria with an NIH stroke scale measuring 14. CT imaging demonstrated a severe proximal left internal carotid artery stenosis and perfusion imaging suggested hypoperfusion to the left hemisphere. After discussing the case in a multidisciplinary fashion, and confirming that there was no immediate available decision maker, the patient was transferred for thrombectomy and emergent 2 physician consent was documented. INDICATION: Acute ischemic ischemic stroke with  left hemi severe hypoperfusion. ANESTHESIA/SEDATION: General anesthesia was utilized for the procedure. CONTRAST:  Approximately 75 cc Omnipaque  300 320 MEDICATIONS: Heparin  6000 units intravenously Brilinta  180 mg per tube FLUOROSCOPY TIME:  Fluoroscopy Time: 15.8 minutes (737 mGy). COMPLICATIONS: None immediate. BODY OF REPORT: The patient was then put under general anesthesia by the Department of Anesthesiology at [facility]. Insert ultrasound access The right groin was prepped and draped in the usual sterile fashion. Ultrasound was used to study the right common femoral artery which was patent. Using real-time ultrasound guidance, a 19 gauge introducer needle was used to access the right common femoral artery. Access was performed at 1357. A hard copy image ultrasound the same date uncertain PACS. Using this access, a 6 French sheath was placed in the descending thoracic aorta. Approximately 5000 units of heparin  was administered intravenously. Next, selective catheterization the left common carotid artery was performed. A selective arteriogram was performed which demonstrated  that the internal carotid artery was patent. But demonstrated a severe focal stenosis estimated at greater than 80% in the single frontal plane of imaging. This was use a lies as it best profiled the bifurcation between the internal carotid artery and external carotid artery. There was no evidence of intracranial filling defect. Scattered mild diffuse atherosclerotic changes are present. Next, the lesion was crossed with a catheter and guidewire. At this point, my tempted to deployed embolic protection device, however, I could not safely deployed the device in the proximal internal carotid artery secondary to tortuosity and concern for device detachment. Therefore, the proximal stenosis was treated with balloon angioplasty using a 4 mm noncompliant balloon. This was considered the first pass for the purposes of documentation and this was performed at 14:20. Next, a precise 6 mm x 40 mm self expanding stent was deployed in the proximal left internal carotid artery extending into the common carotid artery. Deployment was completed at 14:50 and this was considered recanalization. There was a residual waist and therefore stent dilation was performed using a 5 mm noncompliant balloon. A post treatment arteriogram demonstrated no evidence of significant residual proximal stenosis. There was good apposition of the bare metal stent. There was persistent filling of the left external carotid artery. The intracranial arteries were improved in terms of caliber. There was no evidence of filling defect to suggest peripheral embolus. I elected to terminate the procedure at this point. The revascularization score was graded at TICI-3 Evaluation of the right femoral access site demonstrated at this site was suitable for closure device. A 6 French Angio-Seal device was deployed without complication. Become beam CT was then performed to evaluate for intracranial hemorrhage and treatment planning. This demonstrated no evidence  significant acute intracranial hemorrhage. Fluoroscopic imaging confirmed placement of an enteric tube within the stomach. At this point, 180 mg of Brilinta  was administered per tube. The patient was then transferred to the ICU in stable condition while remaining intubated. IMPRESSION: 1. Emergent left internal carotid artery stent placement due to clinical concern for is global left-sided hypoperfusion and developing stroke in the setting of severe left internal carotid artery stenosis. PLAN: 1. To ICU for routine postoperative supportive care. 2. The patient will continue on a heparin  infusion intravenously at 500 units/hour for the next 6 hours. At that point, heparin  will be discontinued. 3. The patient has received a bolus dose of aspirin  PR and a bolus dose of 180 mg of Brilinta  per tube. Electronically Signed   By: Reagan Camera M.D.   On:  04/23/2024 16:31   IR CT Head Ltd Result Date: 04/23/2024 PROCEDURE PERFORMED: 1. Stroke thrombectomy 2. Ultrasound vascular access 3. Cone beam CT for treatment planning COMPARISON:  CT perfusion performed Apr 23, 2024. CLINICAL DATA:  63 year old female with acute ischemic stroke with symptoms of right motor dysfunction sensory deficits and dysarthria with an NIH stroke scale measuring 14. CT imaging demonstrated a severe proximal left internal carotid artery stenosis and perfusion imaging suggested hypoperfusion to the left hemisphere. After discussing the case in a multidisciplinary fashion, and confirming that there was no immediate available decision maker, the patient was transferred for thrombectomy and emergent 2 physician consent was documented. INDICATION: Acute ischemic ischemic stroke with left hemi severe hypoperfusion. ANESTHESIA/SEDATION: General anesthesia was utilized for the procedure. CONTRAST:  Approximately 75 cc Omnipaque  300 320 MEDICATIONS: Heparin  6000 units intravenously Brilinta  180 mg per tube FLUOROSCOPY TIME:  Fluoroscopy Time: 15.8 minutes  (737 mGy). COMPLICATIONS: None immediate. BODY OF REPORT: The patient was then put under general anesthesia by the Department of Anesthesiology at [facility] Insert ultrasound access The right groin was prepped and draped in the usual sterile fashion. Ultrasound was used to study the right common femoral artery which was patent. Using real-time ultrasound guidance, a 19 gauge introducer needle was used to access the right common femoral artery. Access was performed at 1357. A hard copy image ultrasound the same date uncertain PACS. Using this access, a 6 French sheath was placed in the descending thoracic aorta. Approximately 5000 units of heparin  was administered intravenously. Next, selective catheterization the left common carotid artery was performed. A selective arteriogram was performed which demonstrated that the internal carotid artery was patent. But demonstrated a severe focal stenosis estimated at greater than 80% in the single frontal plane of imaging. This was use a lies as it best profiled the bifurcation between the internal carotid artery and external carotid artery. There was no evidence of intracranial filling defect. Scattered mild diffuse atherosclerotic changes are present. Next, the lesion was crossed with a catheter and guidewire. At this point, my tempted to deployed embolic protection device, however, I could not safely deployed the device in the proximal internal carotid artery secondary to tortuosity and concern for device detachment. Therefore, the proximal stenosis was treated with balloon angioplasty using a 4 mm noncompliant balloon. This was considered the first pass for the purposes of documentation and this was performed at 14:20. Next, a precise 6 mm x 40 mm self expanding stent was deployed in the proximal left internal carotid artery extending into the common carotid artery. Deployment was completed at 14:50 and this was considered recanalization. There was a residual  waist and therefore stent dilation was performed using a 5 mm noncompliant balloon. A post treatment arteriogram demonstrated no evidence of significant residual proximal stenosis. There was good apposition of the bare metal stent. There was persistent filling of the left external carotid artery. The intracranial arteries were improved in terms of caliber. There was no evidence of filling defect to suggest peripheral embolus. I elected to terminate the procedure at this point. The revascularization score was graded at TICI-3 Evaluation of the right femoral access site demonstrated at this site was suitable for closure device. A 6 French Angio-Seal device was deployed without complication. Become beam CT was then performed to evaluate for intracranial hemorrhage and treatment planning. This demonstrated no evidence significant acute intracranial hemorrhage. Fluoroscopic imaging confirmed placement of an enteric tube within the stomach. At this point, 180 mg of  Brilinta  was administered per tube. The patient was then transferred to the ICU in stable condition while remaining intubated. IMPRESSION: 1. Emergent left internal carotid artery stent placement due to clinical concern for is global left-sided hypoperfusion and developing stroke in the setting of severe left internal carotid artery stenosis. PLAN: 1. To ICU for routine postoperative supportive care. 2. The patient will continue on a heparin  infusion intravenously at 500 units/hour for the next 6 hours. At that point, heparin  will be discontinued. 3. The patient has received a bolus dose of aspirin  PR and a bolus dose of 180 mg of Brilinta  per tube. Electronically Signed   By: Reagan Camera M.D.   On: 04/23/2024 16:31   CT CEREBRAL PERFUSION W CONTRAST Result Date: 04/23/2024 CLINICAL DATA:  63 year old female code stroke presentation, profound unilateral lower extremity weakness. EXAM: CT PERFUSION BRAIN TECHNIQUE: Multiphase CT imaging of the brain was  performed following IV bolus contrast injection. Subsequent parametric perfusion maps were calculated using RAPID software. RADIATION DOSE REDUCTION: This exam was performed according to the departmental dose-optimization program which includes automated exposure control, adjustment of the mA and/or kV according to patient size and/or use of iterative reconstruction technique. CONTRAST:  50mL OMNIPAQUE  IOHEXOL  350 MG/ML SOLN COMPARISON:  CTA 1207 hours today. FINDINGS: FINDINGS ASPECTS: 10 CBF (<30%) Volume: 0mL. No CBF ir CBV parameter abnormality detected. Perfusion (Tmax>6.0s) volume: 0mL however, 88 mL of T-max greater than 4s is detected which is mostly (although not entirely) in the left hemisphere. Mismatch Volume: Not applicablemL Infarction Location:Not applicable IMPRESSION: 1. NO infarct core or penumbra detected by standard CTP parameters. 2. Suggestion of asymmetric oligemia in the left hemisphere (T-max > 4s), which might relate to the severe ICA stenoses described on CTA today. Electronically Signed   By: Marlise Simpers M.D.   On: 04/23/2024 13:04   CT Angio Chest/Abd/Pel for Dissection W and/or W/WO Result Date: 04/23/2024 CLINICAL DATA:  Weakness and back pain. Acute aortic syndrome suspected. EXAM: CT ANGIOGRAPHY CHEST, ABDOMEN AND PELVIS TECHNIQUE: Non-contrast CT of the chest was initially obtained. Multidetector CT imaging through the chest, abdomen and pelvis was performed using the standard protocol during bolus administration of intravenous contrast. Multiplanar reconstructed images and MIPs were obtained and reviewed to evaluate the vascular anatomy. RADIATION DOSE REDUCTION: This exam was performed according to the departmental dose-optimization program which includes automated exposure control, adjustment of the mA and/or kV according to patient size and/or use of iterative reconstruction technique. CONTRAST:  OMNIPAQUE  IOHEXOL  350 MG/ML SOLN COMPARISON:  CT abdomen pelvis dated  02/09/2022. FINDINGS: Evaluation is limited due to streak artifact caused by patient's arms and overlying support wires. CTA CHEST FINDINGS Cardiovascular: Mild cardiomegaly. No pericardial effusion. There is mild atherosclerotic calcification of the thoracic aorta. No aneurysmal dilatation or dissection. The origins of the great vessels of the aortic arch appear patent. No pulmonary artery embolus identified. Mediastinum/Nodes: No hilar or mediastinal adenopathy. There is circumferential thickening of the mid esophagus concerning for esophagitis. Clinical correlation is recommended. An infiltrative process is not excluded. No mediastinal fluid collection. Lungs/Pleura: There are minimal bibasilar linear atelectasis. No focal consolidation, pleural effusion, or pneumothorax. The central airways are patent. Musculoskeletal: Median sternotomy wires. No acute osseous pathology. Review of the MIP images confirms the above findings. CTA ABDOMEN AND PELVIS FINDINGS VASCULAR Aorta: Advanced atherosclerotic calcification of the abdominal aorta. No aneurysmal dilatation or dissection. No periaortic fluid collection. Celiac: Atherosclerotic calcification of the origin of the celiac artery. The celiac artery and  its major branches are patent. SMA: The SMA is patent. Renals: There is atherosclerotic calcification of the origins of the renal arteries with luminal narrowing. There is diminished flow in the renal arteries bilaterally. The renal arteries however remain patent. IMA: Atherosclerotic calcification of the IMA with diminished flow. Focal area of high-grade narrowing of the proximal IMA with reconstitution of flow distal to this segment. Inflow: Advanced atherosclerotic calcification of the iliac arteries. The iliac arteries are patent. Veins: The IVC is unremarkable.  No portal venous gas. Review of the MIP images confirms the above findings. NON-VASCULAR No intra-abdominal free air.  Small perihepatic ascites.  Hepatobiliary: The liver is unremarkable. No biliary dilatation. The gallbladder is unremarkable. Pancreas: Unremarkable. No pancreatic ductal dilatation or surrounding inflammatory changes. Spleen: Normal in size without focal abnormality. Adrenals/Urinary Tract: The adrenal glands are unremarkable. Moderate bilateral renal parenchyma atrophy. No hydronephrosis. There is stranding of the perinephric and renal sinus fat. Correlation with urinalysis recommended to exclude UTI. The urinary bladder is collapsed. Stomach/Bowel: Mild sigmoid diverticulosis. There is no bowel obstruction or active inflammation. The appendix is normal. Lymphatic: No adenopathy. Reproductive: The uterus is grossly unremarkable. No suspicious adnexal masses. Other: None Musculoskeletal: No acute or significant osseous findings. Review of the MIP images confirms the above findings. IMPRESSION: 1. No aortic aneurysm or dissection. 2. Circumferential thickening of the mid esophagus concerning for esophagitis. Clinical correlation is recommended. 3. Mild sigmoid diverticulosis. No bowel obstruction. Normal appendix. 4. Moderate bilateral renal parenchyma atrophy. No hydronephrosis. Correlation with urinalysis recommended to exclude UTI. Electronically Signed   By: Angus Bark M.D.   On: 04/23/2024 12:44   CT ANGIO HEAD NECK W WO CM (CODE STROKE) Result Date: 04/23/2024 CLINICAL DATA:  63 year old female code stroke presentation. Possible left MCA syndrome. Dialysis patient. EXAM: CT ANGIOGRAPHY HEAD AND NECK WITH AND WITHOUT CONTRAST TECHNIQUE: Multidetector CT imaging of the head and neck was performed using the standard protocol during bolus administration of intravenous contrast. Multiplanar CT image reconstructions and MIPs were obtained to evaluate the vascular anatomy. Carotid stenosis measurements (when applicable) are obtained utilizing NASCET criteria, using the distal internal carotid diameter as the denominator. RADIATION DOSE  REDUCTION: This exam was performed according to the departmental dose-optimization program which includes automated exposure control, adjustment of the mA and/or kV according to patient size and/or use of iterative reconstruction technique. CONTRAST:  50mL OMNIPAQUE  IOHEXOL  350 MG/ML SOLN COMPARISON:  Head CT 1158 hours today. FINDINGS: CTA NECK Skeleton: Reversal of the normal cervical lordosis. Previous sternotomy. Mostly absent dentition. Upper thoracic spina bifida occulta. No acute osseous abnormality identified. Upper chest: Previous CABG. Other neck: Partially retropharyngeal carotids. Nonvascular neck soft tissue spaces are within normal limits. Aortic arch: Calcified aortic atherosclerosis. Bovine arch configuration. Right carotid system: Brachiocephalic artery and proximal right CCA are patent with mild plaque and no stenosis. Tortuous, partially retropharyngeal right CCA. Calcified plaque at the right ICA origin without significant stenosis. Soft and calcified plaque at the bulb or just distal to the bulb with high-grade stenosis (series 2, image 94 and series 6, image 68) approaching a radiographic string sign, numerically estimated at 70-80 % with respect to the distal vessel. The vessel remains patent to the skull base. Left carotid system: Left CCA origin plaque with less than 50% stenosis. Tortuous left CCA. Soft and calcified plaque before the bifurcation. Severe plaque at the left carotid bifurcation with radiographic string sign stenosis series 5, image 133. But the vessel remains patent. Vertebral arteries: Right subclavian origin  calcified plaque without stenosis. Diminutive and atherosclerotic right vertebral artery origin and V1 segment with at least moderate stenosis. Intermittent right V2 segment plaque, but the right vertebral artery caliber improves to the skull base with no additional significant stenosis. Left subclavian origin plaque without stenosis. Calcified plaque at the left  vertebral artery origin with similar mild to moderate stenosis. Codominant left vertebral artery with intermittent V2 and V3 segment plaque but no other significant stenosis to the skull base. CTA HEAD Posterior circulation: Atherosclerotic distal vertebral arteries and vertebrobasilar junction. Dominant left V4. PICA origins remain patent. Mild distal vertebral and vertebrobasilar stenosis. Patent basilar artery without stenosis. Patent SCA and PCA origins. Small posterior communicating arteries. Bilateral PCA branches are within normal limits. Anterior circulation:  Both ICA siphons are patent. Severe calcified atherosclerosis and stenosis of the cavernous left ICA on series 6, image 102, string sign stenosis. Moderate additional left supraclinoid plaque. The vessel remains patent. Left posterior communicating artery origin is normal. Similar severe calcified right siphon plaque and stenosis. Distal right ICA remains patent with moderate additional supraclinoid plaque and stenosis. Patent carotid termini, MCA and ACA origins. Normal anterior communicating artery. Bilateral ACA branches are within normal limits. Right MCA M1 segment and bifurcation are patent without stenosis. No right MCA branch occlusion is identified. Left MCA M1 segment is patent without stenosis. There is a saccular 2-3 mm left MCA bifurcation aneurysm on series 6, image 112, directed anteriorly and laterally. Left MCA bifurcation remains patent. No left MCA branch occlusion is identified. Venous sinuses: Not evaluated due to early contrast timing. Anatomic variants: Dominant left vertebral artery. Review of the MIP images confirms the above findings IMPRESSION: 1. Negative for large vessel occlusion, But Positive for VERY SEVERE underlying atherosclerosis with: - LICA origin and bilateral ICA siphon RADIOGRAPHIC STRING SIGN STENOSES. - Right ICA distal bulb high-grade stenosis, estimated 70-80% and approaching a radiographic string sign. 2.  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 3. Less pronounced posterior circulation, bilateral vertebral artery atherosclerosis but still evidence of up to moderate bilateral vertebral artery stenoses. 4.  Aortic Atherosclerosis (ICD10-I70.0). Salient findings discussed by telephone with Dr. Doretta Gant on 04/23/2024 at 12:25 . Electronically Signed   By: Marlise Simpers M.D.   On: 04/23/2024 12:27   CT HEAD CODE STROKE WO CONTRAST Result Date: 04/23/2024 CLINICAL DATA:  Code stroke. 63 year old female. Dialysis patient. Pain. EXAM: CT HEAD WITHOUT CONTRAST TECHNIQUE: Contiguous axial images were obtained from the base of the skull through the vertex without intravenous contrast. RADIATION DOSE REDUCTION: This exam was performed according to the departmental dose-optimization program which includes automated exposure control, adjustment of the mA and/or kV according to patient size and/or use of iterative reconstruction technique. COMPARISON:  Head CT,Brain MRI 12/14/23. FINDINGS: Brain: Pronounced chronic dural calcification, stable. Stable cerebral volume. No midline shift, ventriculomegaly, mass effect, evidence of mass lesion, intracranial hemorrhage or evidence of cortically based acute infarction. Stable gray-white matter differentiation throughout the brain, relatively normal for age. Vascular: Chronic severe calcification. Skull: Stable visualized osseous structures. Sinuses/Orbits: Severe chronic paranasal sinusitis with mucoperiosteal thickening, chronically eroded posterior septum and sphenoid septum. Stable appearance since 12-13-2024. Tympanic cavities and mastoids remain well aerated. Other: No acute orbit or scalp soft tissue finding. ASPECTS [facility] Stroke Program Early CT Score) Total score (0-10 with 10 being normal): 10 IMPRESSION: 1. Stable noncontrast CT appearance of the brain since Dec 13, 2024, remarkable for unusually advanced dural calcification. ASPECTS 10. No acute intracranial  hemorrhage. 2.  Severe chronic paranasal sinus disease. 3. These results were communicated to Dr. Doretta Gant at 12:11 pm on 04/23/2024 by text page via the [facility] messaging system. Electronically Signed   By: Marlise Simpers M.D.   On: 04/23/2024 12:12   PERIPHERAL VASCULAR CATHETERIZATION Result Date: 04/17/2024 See surgical note for result.   Labs:  CBC: Recent Labs    04/26/24 0959 04/27/24 1758 04/29/24 1255 04/29/24 1302 04/30/24 0700  WBC 8.2 7.8 10.0  --  9.4  HGB 10.3* 10.8* 10.2* 10.5* 10.1*  HCT 31.3* 33.2* 33.2* 31.0* 31.3*  PLT 190 240 267  --  278    COAGS: Recent Labs    04/23/24 1230 04/24/24 0616 04/29/24 1255  INR 1.3* 1.1 1.1  APTT 33  --  32    BMP: Recent Labs    04/26/24 0959 04/27/24 1758 04/29/24 1255 04/29/24 1302 04/30/24 0700  NA 132* 135 135 134* 133*  K 4.4 4.2 4.6 4.6 4.9  CL 95* 95* 96* 99 94*  CO2 22 25 24   --  23  GLUCOSE 176* 139* 160* 159* 205*  BUN 62* 36* 56* 51* 67*  CALCIUM  8.7* 8.9 8.9  --  8.6*  CREATININE 9.03* 5.83* 8.05* 8.10* 9.54*  GFRNONAA 5* 8* 5*  --  4*    LIVER FUNCTION TESTS: Recent Labs    04/23/24 1107 04/24/24 0616 04/26/24 0959 04/27/24 1758 04/29/24 1255 04/30/24 0700  BILITOT 1.5* 0.9  --   --  0.9 0.4  AST 12* 11*  --   --  12* 11*  ALT 8 9  --   --  10 10  ALKPHOS 83 74  --   --  79 79  PROT 8.2* 7.2  --   --  7.3 7.1  ALBUMIN  4.1 3.4* 3.4* 3.4* 3.4* 3.3*    TUMOR MARKERS: No results for input(s): "AFPTM", "CEA", "CA199", "CHROMGRNA" in the last 8760 hours.  Assessment and Plan: Right internal carotid artery stenosis.    Patient is a 63 y/o female with recent CVA-s/p left carotid stent placement 5/5 with findings of right ICA stenosis on imaging. After review by Dr. Alvira Josephs, right internal carotid revascularization recommended.   Risks and benefits of right internal carotid revascularization were discussed with the patient including, but not limited to bleeding, infection, vascular injury,  contrast induced renal failure, stroke or even death.  This interventional procedure involves the use of X-rays and because of the nature of the planned procedure, it is possible that we will have prolonged use of X-ray fluoroscopy.  Potential radiation risks to you include (but are not limited to) the following: - A slightly elevated risk for cancer  several years later in life. This risk is typically less than 0.5% percent. This risk is low in comparison to the normal incidence of human cancer, which is 33% for women and 50% for men according to the [employer]. - Radiation induced injury can include skin redness, resembling a rash, tissue breakdown / ulcers and hair loss (which can be temporary or permanent).   The likelihood of either of these occurring depends on the difficulty of the procedure and whether you are sensitive to radiation due to previous procedures, disease, or genetic conditions.   IF your procedure requires a prolonged use of radiation, you will be notified and given written instructions for further action.  It is your responsibility to monitor the irradiated area for the 2 weeks following the procedure and to notify your physician if you  are concerned that you have suffered a radiation induced injury.    All of the patient's questions were answered, patient is agreeable to proceed.  Consent signed and in chart.    Thank you for this interesting consult.  I greatly enjoyed meeting SHIZUYE MESSAMORE and look forward to participating in their care.  A copy of this report was sent to the requesting provider on this date.  Electronically Signed: Lawrance Presume, PA 04/30/2024, 10:14 AM   I spent a total of 20 Minutes    in face to face in clinical consultation, greater than 50% of which was counseling/coordinating care for right internal carotid artery stenosis

## 2024-04-30 NOTE — Progress Notes (Addendum)
 STROKE TEAM PROGRESS NOTE   INTERIM HISTORY/SUBJECTIVE  Patient sitting on the side of the bed in NAD. She states that she had developed a left facial droop and slurred and left side weakness. She states this has all improved.   CTA head and neck oss of flow related enhancement of the distal CCA and proximal ICA is probably related to artifact from interval stent. Greater than 70% stenosis of the proximal right ICA Dr. Alvira Josephs has been consulted for a right carotid stent.  She is on ASA and brilinta   Carotid doppler is ordered.   CBC    Component Value Date/Time   WBC 9.4 04/30/2024 0700   RBC 3.41 (L) 04/30/2024 0700   HGB 10.1 (L) 04/30/2024 0700   HGB 12.6 11/18/2013 0138   HCT 31.3 (L) 04/30/2024 0700   HCT 37.9 11/18/2013 0138   PLT 278 04/30/2024 0700   PLT 444 (H) 11/18/2013 0138   MCV 91.8 04/30/2024 0700   MCV 83 11/18/2013 0138   MCH 29.6 04/30/2024 0700   MCHC 32.3 04/30/2024 0700   RDW 14.3 04/30/2024 0700   RDW 14.1 11/18/2013 0138   LYMPHSABS 1.4 04/29/2024 1255   MONOABS 0.8 04/29/2024 1255   EOSABS 0.4 04/29/2024 1255   BASOSABS 0.1 04/29/2024 1255    BMET    Component Value Date/Time   NA 133 (L) 04/30/2024 0700   NA 134 (L) 11/18/2013 0138   K 4.9 04/30/2024 0700   K 3.8 11/18/2013 0138   CL 94 (L) 04/30/2024 0700   CL 98 11/18/2013 0138   CO2 23 04/30/2024 0700   CO2 29 11/18/2013 0138   GLUCOSE 205 (H) 04/30/2024 0700   GLUCOSE 348 (H) 11/18/2013 0138   BUN 67 (H) 04/30/2024 0700   BUN 11 11/18/2013 0138   CREATININE 9.54 (H) 04/30/2024 0700   CREATININE 0.87 11/18/2013 0138   CALCIUM  8.6 (L) 04/30/2024 0700   CALCIUM  9.4 11/18/2013 0138   GFRNONAA 4 (L) 04/30/2024 0700   GFRNONAA >60 11/18/2013 0138    IMAGING past 24 hours DG Elbow 2 Views Left Result Date: 04/29/2024 CLINICAL DATA:  Pain elbow pain EXAM: LEFT ELBOW - 2 VIEW COMPARISON:  CTA chest, abdomen, and pelvis dated 04/23/2024 FINDINGS: Ill-defined density dispersed through  the subcutaneous soft tissues of the partially imaged upper arm overlying the ulnar aspect of the humerus. No acute dislocation. Subtle cortical angulation at the level of the radial neck. Moderate elbow joint effusion. IMPRESSION: 1. Subtle cortical angulation at the level of the radial neck, which may represent a nondisplaced fracture in the appropriate clinical setting. 2. Moderate elbow joint effusion. 3. Ill-defined density dispersed through the subcutaneous soft tissues of the partially imaged upper arm overlying the ulnar aspect of the humerus, which may represent extravasated contrast material. The patient underwent a contrast-enhanced CT examination on 04/23/2024 with contrast instillation via the left upper extremity. Electronically Signed   By: Limin  Anyjah Roundtree M.D.   On: 04/29/2024 13:22   MR BRAIN WO CONTRAST Result Date: 04/29/2024 CLINICAL DATA:  Neuro deficit, acute, stroke suspected EXAM: MRI HEAD WITHOUT CONTRAST MRA HEAD WITHOUT CONTRAST MRA NECK WITHOUT CONTRAST TECHNIQUE: Multiplanar, multiecho pulse sequences of the brain and surrounding structures were obtained without intravenous contrast. Angiographic images of the Circle of Willis were obtained using MRA technique without intravenous contrast. Angiographic images of the neck were obtained using MRA technique without intravenous contrast. Carotid stenosis measurements (when applicable) are obtained utilizing NASCET criteria, using the distal internal carotid diameter as  the denominator. COMPARISON:  CTA 04/23/2024, MRI head 12/07/2023 FINDINGS: MRI HEAD Motion artifact. Brain: There is no acute infarction or intracranial hemorrhage. There is no intracranial mass, mass effect, or edema. There is no hydrocephalus or extra-axial fluid collection. Ventricles and sulci are stable in size and configuration. Patchy foci of T2 hyperintensity in the supratentorial white matter are nonspecific but may reflect stable mild chronic microvascular ischemic  changes. Vascular: Major vessel flow voids at the skull base are preserved. Skull and upper cervical spine: Normal marrow signal is preserved. Sinuses/Orbits: Postoperative changes with minimal patchy mucosal thickening. Right lens replacement. Other: Evidence of prior sellar mass resection minimal patchy mastoid fluid opacification. Retention cysts along the posterior wall of the nasopharynx. MRA HEAD Motion artifact. Intracranial internal carotid arteries are patent. Atherosclerotic irregularity with stenosis. There is up to marked stenosis of the right cavernous ICA and moderate marked stenosis of the left paraclinoid ICA, likely similar to prior CTA. Middle and anterior cerebral arteries are patent. 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 Intracranial vertebral arteries, basilar artery, posterior cerebral arteries are patent. Bilateral posterior communicating arteries are present. No significant stenosis. MRA NECK Motion artifact. Right common, internal, and external carotid arteries are patent. There is plaque without hemodynamically significant stenosis at the ICA origin. Subsequent circumferential narrowing of the proximal ICA causing greater than 70% stenosis. Likely no significant change from the prior CTA. Left common carotid artery is patent. There is loss of flow at the distal CCA and proximal ICA that may be related to interval stent placement. Mid to distal cervical ICA is patent. Vertebral arteries are patent. There is limited evaluation proximally due to artifact. No significant stenosis identified. IMPRESSION: Degraded by motion. No acute infarction, hemorrhage, or mass. No significant change since the prior brain MRI. Loss of flow related enhancement of the distal CCA and proximal ICA is probably related to artifact from interval stent. Greater than 70% stenosis of the proximal right ICA is probably similar to the prior CTA. Right greater than left intracranial ICA atherosclerosis and  stenosis is likely similar to the prior CTA. 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 Electronically Signed   By: Geannie Keener M.D.   On: 04/29/2024 12:38   MR ANGIO HEAD WO CONTRAST Result Date: 04/29/2024 CLINICAL DATA:  Neuro deficit, acute, stroke suspected EXAM: MRI HEAD WITHOUT CONTRAST MRA HEAD WITHOUT CONTRAST MRA NECK WITHOUT CONTRAST TECHNIQUE: Multiplanar, multiecho pulse sequences of the brain and surrounding structures were obtained without intravenous contrast. Angiographic images of the Circle of Willis were obtained using MRA technique without intravenous contrast. Angiographic images of the neck were obtained using MRA technique without intravenous contrast. Carotid stenosis measurements (when applicable) are obtained utilizing NASCET criteria, using the distal internal carotid diameter as the denominator. COMPARISON:  CTA 04/23/2024, MRI head 12/07/2023 FINDINGS: MRI HEAD Motion artifact. Brain: There is no acute infarction or intracranial hemorrhage. There is no intracranial mass, mass effect, or edema. There is no hydrocephalus or extra-axial fluid collection. Ventricles and sulci are stable in size and configuration. Patchy foci of T2 hyperintensity in the supratentorial white matter are nonspecific but may reflect stable mild chronic microvascular ischemic changes. Vascular: Major vessel flow voids at the skull base are preserved. Skull and upper cervical spine: Normal marrow signal is preserved. Sinuses/Orbits: Postoperative changes with minimal patchy mucosal thickening. Right lens replacement. Other: Evidence of prior sellar mass resection minimal patchy mastoid fluid opacification. Retention cysts along the posterior wall of the nasopharynx. MRA  HEAD Motion artifact. Intracranial internal carotid arteries are patent. Atherosclerotic irregularity with stenosis. There is up to marked stenosis of the right cavernous ICA and moderate marked stenosis of the left  paraclinoid ICA, likely similar to prior CTA. Middle and anterior cerebral arteries are patent. 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 Intracranial vertebral arteries, basilar artery, posterior cerebral arteries are patent. Bilateral posterior communicating arteries are present. No significant stenosis. MRA NECK Motion artifact. Right common, internal, and external carotid arteries are patent. There is plaque without hemodynamically significant stenosis at the ICA origin. Subsequent circumferential narrowing of the proximal ICA causing greater than 70% stenosis. Likely no significant change from the prior CTA. Left common carotid artery is patent. There is loss of flow at the distal CCA and proximal ICA that may be related to interval stent placement. Mid to distal cervical ICA is patent. Vertebral arteries are patent. There is limited evaluation proximally due to artifact. No significant stenosis identified. IMPRESSION: Degraded by motion. No acute infarction, hemorrhage, or mass. No significant change since the prior brain MRI. Loss of flow related enhancement of the distal CCA and proximal ICA is probably related to artifact from interval stent. Greater than 70% stenosis of the proximal right ICA is probably similar to the prior CTA. Right greater than left intracranial ICA atherosclerosis and stenosis is likely similar to the prior CTA. 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 Electronically Signed   By: Geannie Keener M.D.   On: 04/29/2024 12:38   MR ANGIO NECK WO CONTRAST Result Date: 04/29/2024 CLINICAL DATA:  Neuro deficit, acute, stroke suspected EXAM: MRI HEAD WITHOUT CONTRAST MRA HEAD WITHOUT CONTRAST MRA NECK WITHOUT CONTRAST TECHNIQUE: Multiplanar, multiecho pulse sequences of the brain and surrounding structures were obtained without intravenous contrast. Angiographic images of the Circle of Willis were obtained using MRA technique without intravenous contrast.  Angiographic images of the neck were obtained using MRA technique without intravenous contrast. Carotid stenosis measurements (when applicable) are obtained utilizing NASCET criteria, using the distal internal carotid diameter as the denominator. COMPARISON:  CTA 04/23/2024, MRI head 12/07/2023 FINDINGS: MRI HEAD Motion artifact. Brain: There is no acute infarction or intracranial hemorrhage. There is no intracranial mass, mass effect, or edema. There is no hydrocephalus or extra-axial fluid collection. Ventricles and sulci are stable in size and configuration. Patchy foci of T2 hyperintensity in the supratentorial white matter are nonspecific but may reflect stable mild chronic microvascular ischemic changes. Vascular: Major vessel flow voids at the skull base are preserved. Skull and upper cervical spine: Normal marrow signal is preserved. Sinuses/Orbits: Postoperative changes with minimal patchy mucosal thickening. Right lens replacement. Other: Evidence of prior sellar mass resection minimal patchy mastoid fluid opacification. Retention cysts along the posterior wall of the nasopharynx. MRA HEAD Motion artifact. Intracranial internal carotid arteries are patent. Atherosclerotic irregularity with stenosis. There is up to marked stenosis of the right cavernous ICA and moderate marked stenosis of the left paraclinoid ICA, likely similar to prior CTA. Middle and anterior cerebral arteries are patent. 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 Intracranial vertebral arteries, basilar artery, posterior cerebral arteries are patent. Bilateral posterior communicating arteries are present. No significant stenosis. MRA NECK Motion artifact. Right common, internal, and external carotid arteries are patent. There is plaque without hemodynamically significant stenosis at the ICA origin. Subsequent circumferential narrowing of the proximal ICA causing greater than 70% stenosis. Likely no significant change from the  prior CTA. Left common carotid artery is patent. There  is loss of flow at the distal CCA and proximal ICA that may be related to interval stent placement. Mid to distal cervical ICA is patent. Vertebral arteries are patent. There is limited evaluation proximally due to artifact. No significant stenosis identified. IMPRESSION: Degraded by motion. No acute infarction, hemorrhage, or mass. No significant change since the prior brain MRI. Loss of flow related enhancement of the distal CCA and proximal ICA is probably related to artifact from interval stent. Greater than 70% stenosis of the proximal right ICA is probably similar to the prior CTA. Right greater than left intracranial ICA atherosclerosis and stenosis is likely similar to the prior CTA. 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 Electronically Signed   By: Geannie Keener M.D.   On: 04/29/2024 12:38    Vitals:   04/29/24 2000 04/30/24 0000 04/30/24 0400 04/30/24 0842  BP: 124/62 119/67 (!) 136/57 (!) 150/29  Pulse: (!) 55 (!) 57 (!) 59 (!) 59  Resp: 18 17 18 18   Temp: 98.5 F (36.9 C) 98.2 F (36.8 C) 98.6 F (37 C) 97.8 F (36.6 C)  TempSrc: Oral Oral Oral Oral  SpO2: 94% 93% 93% 100%  Weight:      Height:         PHYSICAL EXAM General:  Alert, well-nourished, well-developed patient in no acute distress Psych:  Mood and affect appropriate for situation CV: Regular rate and rhythm on monitor Respiratory:  Regular, unlabored respirations on room air GI: Abdomen soft and nontender   NEURO:  Mental Status: AA&Ox3, patient is able to give clear and coherent history Speech/Language: speech is without aphasia. Mild dysarthria Naming, repetition, fluency, and comprehension intact.  Cranial Nerves:  II: PERRL. Visual fields full.  III, IV, VI: EOMI. Eyelids elevate symmetrically.  V: Sensation is intact to light touch and symmetrical to face.  VII: Face is symmetrical resting and smiling VIII: hearing intact  to voice. IX, X: Palate elevates symmetrically. Phonation is normal.  [id number] shrug 5/5. XII: tongue is midline without fasciculations. Motor: 5/5 strength in right arm and right leg, left arm limited due to pain , left leg 4/5 Tone: is normal and bulk is normal Sensation- decreased sensation on left body Extinction absent to light touch to DSS.   Coordination: FTN intact bilaterally, HKS: no ataxia in BLE.No drift.  Gait- deferred  Most Recent NIH 4    ASSESSMENT/PLAN  Terry Arroyo is a 63 y.o. female with history of  Stroke 2014 affecting left side, Left ICA stent placement 04/2024 for acute stroke and discharged 5/10 on aspirin  and brilinta , ESRD, CAD, CHFrEF, s/p CABG 2018, MI 2018, OSA who was BIB EMS as an activated CODE STROKE due to left sided weakness and slurred speech. NIH on Admission 10  Right brain TIA, etiology:  likely due to symptomatic stenosis of right ICA   Code Stroke  CT head No acute abnormality. ASPECTS 10.    MRI  No acute infarction, hemorrhage, or mass  MRA  Greater than 70% stenosis of the proximal right ICA. Right greater than left intracranial ICA atherosclerosis and stenosis  Carotid Doppler right ICA 40 to 59% stenosis, left ICA stent patent less than 50% stenosis. VTE prophylaxis - hep SQ aspirin  81 mg daily and Brilinta  (ticagrelor ) 90 mg bid prior to admission, now on aspirin  81 mg daily and Brilinta  (ticagrelor ) 90 mg bid per interventional radiologist  Therapy recommendations:  Pending Disposition:  pending   Hx of Stroke/TIA Symptomatic bilateral carotid  stenosis s/p L ICA stent  Per report, stroke in 2014 with left-sided residual Admitted 04/23/2024 for right-sided weakness and aphasia.  CT no acute abnormality.  CTA head and neck left ICA string sign, bilateral ICA siphon severe stenosis, right ICA 70 to 80% stenosis.  Bilateral moderate VA stenosis.  MRI no infarct but chronic CSVT with collateral flow.  CT perfusion negative.  2D Echo  EF 35-40%.  EEG negative for seizure.  LDL 75, A1c 8.6.  Status post left ICA stent placement.  Discharged on aspirin  and Brilinta  and Lipitor  80.   This time admitted with right ICA symptomatic stenosis  Carotid Doppler right ICA 40 to 59% stenosis, left ICA stent patent less than 50% stenosis. Dr Alvira Josephs consulted- Plan for right ICA stent placement on Wednesday   Hypertension Home meds: Amlodipine  10 mg daily, carvedilol  25 mg twice daily Stable Avoid low BP given carotid stenosis BP goal 130-160 prior to carotid intervention Long-term goal normotensive   Hyperlipidemia Home meds: Atorvastatin  80 mg daily Lipitor  80 resumed in hospital LDL 75, goal < 70 Continue statin at discharge   Diabetes type II Uncontrolled Home meds: NovoLog  70/30 15 units 3 times daily with meals HgbA1c 8.6, goal < 7.0 CBGs SSI Recommend close follow-up with PCP for better DM control   End-stage renal disease on dialysis Patient is typically dialyzed Monday Wednesday Friday Nephrology consulted for inpatient dialysis Had HD today   Other Stroke Risk Factors Coronary artery disease status post CABG Congestive heart failure with EF 35 to 40% OSA  Other medical issues Cerebral aneurysm, CVA and MRA showed 2 to 3 mm left MCA bifurcation saccular aneurysm.  Continue follow-up with Dr. Devesh for as outpatient for monitoring. Pituitary adenoma status postsurgery COPD continue home meds  Hospital day # 0   Jonette Nestle DNP, ACNPC-AG  [facility]  ATTENDING NOTE: I reviewed above note and agree with the assessment and plan. Pt was seen and examined.   Patient seen at dialysis unit.  She is laying in bed, complaining of left arm pain, likely related to contrast extravasation, orthopedic consulted, on left arm sling.  Initially sleeping, but easily arousable, able to maintain wakefulness.  Orientated to place, time and age but psychomotor slowing.  Visual fields full, no gaze palsy, no  aphasia but paucity of speech, follows simple commands.  Mild dysarthria.  No significant facial droop, tongue midline.  Left upper extremity 3 -/5 due to pain, right upper extremity 4/5, bilateral lower extremity 3/5.  Sensation decreased on the left face, arm and leg.  Right finger-to-nose intact, left finger-nose not able to perform.  Gait not tested.  For detailed assessment and plan, please refer to above as I have made changes wherever appropriate.   Consuelo Denmark, MD PhD Stroke Neurology 04/30/2024 4:09 PM     To contact Stroke Continuity provider, please refer to [email or web address]. After hours, contact General Neurology

## 2024-04-30 NOTE — Evaluation (Signed)
 Occupational Therapy Evaluation Patient Details Name: Terry Arroyo MRN: [id number] DOB: [birth year] Today's Date: 04/30/2024   History of Present Illness   Patient is a 63 year old female admitted with left side weakness, arm pain, and slurred speech. MRI (-) for CVA. Work up for TIA. Recent admission 5/5-5/10 acute ischemic MCA  CVA secondary to left ICA stenosis treated with stenting and DAPT.  MRI following intervention did not show infarct so the stroke was aborted by the intervention. Xray L elbow - Moderate elbow joint effusion; Possible left radial neck fracture. History of ESRD on HD, HTN, CAD, CVA, CHF, DVT, COPD, left eye blindness.     Clinical Impressions Prior to recent hospitalization , pt living alone independently, took a van to HD MWF and had meals/groceries delivered. Pt has residual L sided weakness from a CVA in 2014. No documented CVA from last hospitalization s/p revascularization. Primary complaint during session is L elbow pain. Pt states this pain started after DC home and she has been "keeping it still". States ice makes it feel better. Pt states she has not fallen or hit arm on anything. Xray shows moderate joint effusion "which may represent extravasated contrast material from contrast-enhanced CT examination on 04/23/2024". Encouraged ROM within pain tolerance, elevation and ice. Messaged MD about L elbow pain.  Discussed option of SNF however pt states she is going home. If DC home is plan recommend HHOT and HH Aide. Acute OT to follow.       If plan is discharge home, recommend the following:   A little help with bathing/dressing/bathroom;Assistance with cooking/housework;Assist for transportation     Functional Status Assessment   Patient has had a recent decline in their functional status and demonstrates the ability to make significant improvements in function in a reasonable and predictable amount of time.     Equipment Recommendations   None  recommended by OT     Recommendations for Other Services         Precautions/Restrictions   Precautions Precautions: Fall Recall of Precautions/Restrictions: Intact Required Braces or Orthoses: Sling (LUE - for comfort) Restrictions Weight Bearing Restrictions Per Provider Order: No     Mobility Bed Mobility Overal bed mobility: Modified Independent                  Transfers Overall transfer level: Needs assistance Equipment used: 1 person hand held assist Transfers: Sit to/from Stand Sit to Stand: Supervision Stand pivot transfers: Contact guard assist         General transfer comment: pt recently received pain meds and feels "woozy"      Balance Overall balance assessment: Needs assistance Sitting-balance support: Feet supported Sitting balance-Leahy Scale: Good Sitting balance - Comments: patient able to reach outside base of support without loss of balance     Standing balance-Leahy Scale: Fair                             ADL either performed or assessed with clinical judgement   ADL Overall ADL's : Needs assistance/impaired Eating/Feeding: Set up   Grooming: Supervision/safety;Standing   Upper Body Bathing: Set up;Sitting   Lower Body Bathing: Supervison/ safety;Sit to/from stand   Upper Body Dressing : Supervision/safety;Set up;Sitting   Lower Body Dressing: Contact guard assist;Sit to/from stand   Toilet Transfer: Contact guard assist   Toileting- Clothing Manipulation and Hygiene: Supervision/safety       Functional mobility during ADLs: Contact guard assist (  due to feeling "woozy" form pain meds)       Vision Baseline Vision/History: 1 Wears glasses (Blind L eye) Vision Assessment?:  (appears at baseline)     Perception         Praxis         Pertinent Vitals/Pain Pain Assessment Pain Assessment: Faces Faces Pain Scale: Hurts whole lot Pain Location: L elbow (wiht movement) Pain Descriptors /  Indicators: Guarding, Sore Pain Intervention(s): Limited activity within patient's tolerance, Premedicated before session, Ice applied, Repositioned     Extremity/Trunk Assessment Upper Extremity Assessment Upper Extremity Assessment: LUE deficits/detail;Left hand dominant LUE Deficits / Details: Pt with mod edema noted around elbow; complaining of pain on epicondyles,mostly lateral; shoulder, wrist and hand ROM WFL; limited elbow flex/ext.- able to flex to @ 90 degrees and extend around -30 degrees. States she has been keeping "it still because it hurts". xray shows moderate joint effusion which may represent extravasated contrast material.  The patient underwent a contrast-enhanced CT examination on  04/23/2024. Encouraged elbow ROM as tolerated , elevation and ice; sling only for ambulation if needed LUE Coordination: decreased gross motor   Lower Extremity Assessment Lower Extremity Assessment: Defer to PT evaluation RLE Deficits / Details: 5/5 dorsiflexion/plantarflexion. knee extension 4+/5 RLE Sensation: history of peripheral neuropathy LLE Deficits / Details: 5/5 dorsiflexion/plantarflexion. knee extension 5/5   Cervical / Trunk Assessment Cervical / Trunk Assessment: Normal   Communication Communication Communication: No apparent difficulties   Cognition Arousal: Alert Behavior During Therapy: WFL for tasks assessed/performed Cognition: No family/caregiver present to determine baseline             OT - Cognition Comments: most likely close to baseline                 Following commands: Intact       Cueing  General Comments   Cueing Techniques: Verbal cues  encouraged patient to have LUE supported on a pillow for comfort. she reports pain is increased in the elbow with active movement   Exercises Exercises: General Upper Extremity General Exercises - Upper Extremity Elbow Flexion: AAROM, AROM, Left, 5 reps Elbow Extension: AROM, AAROM, Left, 5  reps Other Exercises Other Exercises: use ice on elbow and keep elbow supported and elevated   Shoulder Instructions      Home Living Family/patient expects to be discharged to:: Private residence Living Arrangements: Alone Available Help at Discharge: Family;Friend(s);Available PRN/intermittently Type of Home: Apartment Home Access: Elevator     Home Layout: One level     Bathroom Shower/Tub: Chief Strategy Officer: Standard Bathroom Accessibility: Yes How Accessible: Accessible via walker Home Equipment: Rollator (4 wheels);Cane - single point;Shower seat;BSC/3in1   Additional Comments: had home health set-up following previous hosptial stay; only had HH nursing visit      Prior Functioning/Environment Prior Level of Function : Independent/Modified Independent             Mobility Comments: takes bus to HD, has groceries delivered. was only home one day and reports using rollator for ambulation ADLs Comments: reports neighbors assist with (vacumming) but otherwise independent.  meds are delievered    OT Problem List: Decreased strength;Decreased range of motion;Impaired balance (sitting and/or standing);Decreased safety awareness;Decreased knowledge of use of DME or AE;Pain;Impaired UE functional use   OT Treatment/Interventions: Self-care/ADL training;Therapeutic exercise;DME and/or AE instruction;Therapeutic activities;Patient/family education;Balance training      OT Goals(Current goals can be found in the care plan section)   Acute Rehab OT  Goals Patient Stated Goal: better pain control L arm OT Goal Formulation: With patient Time For Goal Achievement: 05/14/24 Potential to Achieve Goals: Good   OT Frequency:  Min 2X/week    Co-evaluation              AM-PAC OT "6 Clicks" Daily Activity     Outcome Measure Help from another person eating meals?: A Little Help from another person taking care of personal grooming?: A Little Help from  another person toileting, which includes using toliet, bedpan, or urinal?: A Little Help from another person bathing (including washing, rinsing, drying)?: A Little Help from another person to put on and taking off regular upper body clothing?: A Little Help from another person to put on and taking off regular lower body clothing?: A Little 6 Click Score: 18   End of Session Equipment Utilized During Treatment: Gait belt Nurse Communication: Mobility status  Activity Tolerance: Patient tolerated treatment well Patient left: in bed;with call bell/phone within reach;with bed alarm set (sittin gEOB eating)  OT Visit Diagnosis: Unsteadiness on feet (R26.81);Pain Pain - Right/Left: Left Pain - part of body: Arm                Time: 0945-1006 OT Time Calculation (min): 21 min Charges:  OT General Charges $OT Visit: 1 Visit OT Evaluation $OT Eval Low Complexity: 1 Low  Siedah Sedor, OT/L   Acute OT Clinical Specialist Acute Rehabilitation Services Pager [phone] Office [phone]   [facility] 04/30/2024, 10:39 AM

## 2024-04-30 NOTE — Progress Notes (Signed)
 SLP Cancellation Note  Patient Details Name: Terry Arroyo MRN: [id number] DOB: 01/14/1961   Cancelled treatment:       Reason Eval/Treat Not Completed: Patient at procedure or test/unavailable. Pt in HD. Will continue efforts.   Naomia Bachelor 04/30/2024, 1:47 PM

## 2024-04-30 NOTE — Progress Notes (Signed)
 Contacted [facility] this morning to be advised that pt was d/c on Saturday but was re-admitted on Sunday. Clinic aware pt will not resume care there today for this reason. Pt receives out-pt HD at [facility] on MWF 10:15 arrival for 10:25 chair time. Will assist as needed.   Lauraine Polite Renal Navigator [phone]

## 2024-04-30 NOTE — Hospital Course (Addendum)
 Terry Arroyo y.o. female with medical history significant of diabetes, neuropathy, hypertension, hyperlipidemia, GERD, stroke, CAD status post CABG, anemia, chronic combined systolic and diastolic CHF, ESRD on HD, left eye blindness, brain aneurysm, benign pituitary neoplasm, DVT, depression, cataract, COPD, OSA presenting with slurred speech, left-sided weakness and some left arm pain.   Recent admitted 5/5-5/10 on neurology service for acute ischemic MCA  secondary to left ICA stenosis treated with stenting and DAPT.  MRI following intervention did not show infarct so the stroke was aborted by the intervention. In the ED :Vital signs in the ED notable for blood pressure in the 110s-160s systolic, heart rate in the 50s-60s.  Lab creatinine stable at 8.5, glucose 160, albumin  3.4.  CBC with hemoglobin stable at 10.2.  PT, PTT, INR within normal limits.  Ethanol level negative.  CT head>>no acute abnormality. MRI brain >>motion degradation but no acute abnormality. MRA of the head and neck>> showed similar carotid artery disease with greater than 70% stenosis on the right side and status post intervention on the left.  Also noted was stable aneurysm. Left elbow x-ray showed questionable radial neck fracture with moderate elbow effusion and possible evidence of extravasation of contrast at the soft tissue near the ulna. Patient received Dilaudid  in the ED.  Neurology was consulted and evaluated the patient-possible TIA/symptomatic right ICA stenosis.  With modified TIA workup recommended given recent workup for stroke  Subjective; Seen examined this morning Overnight heart rate in 50s-60s, on room air BP 130s-150s Labs reviewed this morning BUN 67 creatinine 9.5 potassium 4.9 bicarb 23 CBC with chronic anemia 10.1 g Complains of stiffness and swelling left elbow  Assessment and plan:  TIA Recent CVA Carotid artery disease Recent admission after a left ICA stenosis leading to MCA ischemia s/p  stent: Admitted after episode of tiredness, slurred speech, left arm weakness and left arm pain. Imaging findings unremarkable, neurology consulted recommended modified TIA workup  continue telemetry Fu duplex bilateral-?concern for possible symptomatic right carotid artery disease symptoms questionable radial neck fracture stable greater than 70% stenosis on the right. 2D echo EF 35-40% G2 DD.  LDL 75 A1c 8.6. PT OT speech eval. Continue aspirin  81, Brilinta , Lipitor  80 Neurocheck per protocol   Bilateral carotid stenosis status post left stent Cerebral aneurysm History of stroke/TIA: Given recent ischemic stroke May 2025  aborted by left ICA stent placement- Dr Alvira Josephs consulted plan for stent placement on Wednesday  Left elbow pain Effusion on x-ray with questionable radial neck fracture and possible extravasation of prior contrast material. Continue sling, follow-up x-ray/soft tissue US - will ask Ortho eval Per OT patient stated after going home she stopped moving all left elbow and has become more stiff   ESRD on HD MWF Nephrology consulted for HD. For HD today.   Hypertension BP well-controlled.  Not on meds currently.     Hyperlipidemia Continue atorvastatin  as above   CAD s/p CABG: On ASA, Brilinta  as above and atorvastatin    Anemia of chronic kidney disease Hemoglobin stable at 10 g baseline.  Mild tach  Recent Labs  Lab 04/26/24 0959 04/27/24 1758 04/29/24 1255 04/29/24 1302 04/30/24 0700  HGB 10.3* 10.8* 10.2* 10.5* 10.1*  HCT 31.3* 33.2* 33.2* 31.0* 31.3*    Chronic combined systolic and diastolic CHF Last echo was 4 days PTA EF 35-40%, G2 DD, mild reduced RV function.  MR, TR. Manage volume with HD.Holding carvedilol    DM A1c 8.6 blood sugar fluctuating at times >200, continue well-controlled on SSI Recent Labs  Lab 04/23/24 1927 04/23/24 1934 04/28/24 1125 04/29/24 1052 04/29/24 1723 04/29/24 2128 04/30/24 0627  GLUCAP  --    < > 156* 191*  173* 88 215*  HGBA1C 8.6*  --   --   --   --   --   --    < > = values in this interval not displayed.     History of brain aneurysm Stable on imaging   Depression Mood stable on mirtazapine  and trazodone    COPD Continue home albuterol    OSA Continue home CPAP   History of cataracts History of DVT Left eye blindness - Noted

## 2024-04-30 NOTE — Progress Notes (Signed)
 Orthopedic Tech Progress Note Patient Details:  Terry Arroyo 1961/04/02 [id number]  Ortho Devices Type of Ortho Device: Arm sling Ortho Device/Splint Location: LUE Ortho Device/Splint Interventions: Ordered, Application, Adjustment  some lady called about arm sling, applied while patient was sitting, she didn't want it to high, I told her if she works with PT/OT later that they may bring it up a little higher on her arm..(applied where she was comfortable)    Post Interventions Patient Tolerated: Fair Instructions Provided: Care of device  Kermitt Pedlar 04/30/2024, 9:25 AM

## 2024-04-30 NOTE — Progress Notes (Signed)
 PROGRESS NOTE Terry Arroyo  QMV:[id number] DOB: 01-12-1961 DOA: 04/29/2024 PCP: Jimmy Moulding, MD  Brief Narrative/Hospital Course: [place].Angle y.o. female with medical history significant of diabetes, neuropathy, hypertension, hyperlipidemia, GERD, stroke, CAD status post CABG, anemia, chronic combined systolic and diastolic CHF, ESRD on HD, left eye blindness, brain aneurysm, benign pituitary neoplasm, DVT, depression, cataract, COPD, OSA presenting with slurred speech, left-sided weakness and some left arm pain.   Recent admitted 5/5-5/10 on neurology service for acute ischemic MCA  secondary to left ICA stenosis treated with stenting and DAPT.  MRI following intervention did not show infarct so the stroke was aborted by the intervention. In the ED :Vital signs in the ED notable for blood pressure in the 110s-160s systolic, heart rate in the 50s-60s.  Lab creatinine stable at 8.5, glucose 160, albumin  3.4.  CBC with hemoglobin stable at 10.2.  PT, PTT, INR within normal limits.  Ethanol level negative.  CT head>>no acute abnormality. MRI brain >>motion degradation but no acute abnormality. MRA of the head and neck>> showed similar carotid artery disease with greater than 70% stenosis on the right side and status post intervention on the left.  Also noted was stable aneurysm. Left elbow x-ray showed questionable radial neck fracture with moderate elbow effusion and possible evidence of extravasation of contrast at the soft tissue near the ulna. Patient received Dilaudid  in the ED.  Neurology was consulted and evaluated the patient-possible TIA/symptomatic right ICA stenosis.  With modified TIA workup recommended given recent workup for stroke  Subjective; Seen examined this morning Overnight heart rate in 50s-60s, on room air BP 130s-150s Labs reviewed this morning BUN 67 creatinine 9.5 potassium 4.9 bicarb 23 CBC with chronic anemia 10.1 g Complains of stiffness and swelling left  elbow  Assessment and plan:  TIA Recent CVA Carotid artery disease Recent admission after a left ICA stenosis leading to MCA ischemia s/p stent: Admitted after episode of tiredness, slurred speech, left arm weakness and left arm pain. Imaging findings unremarkable, neurology consulted recommended modified TIA workup  continue telemetry Fu duplex bilateral-?concern for possible symptomatic right carotid artery disease symptoms questionable radial neck fracture stable greater than 70% stenosis on the right. 2D echo EF 35-40% G2 DD.  LDL 75 A1c 8.6. PT OT speech eval. Continue aspirin  81, Brilinta , Lipitor  80 Neurocheck per protocol   Bilateral carotid stenosis status post left stent Cerebral aneurysm History of stroke/TIA: Given recent ischemic stroke May 2025  aborted by left ICA stent placement- Dr Alvira Josephs consulted plan for stent placement on Wednesday  Left elbow pain Effusion on x-ray with questionable radial neck fracture and possible extravasation of prior contrast material. Continue sling, follow-up x-ray/soft tissue US - will ask Ortho eval Per OT patient stated after going home she stopped moving all left elbow and has become more stiff   ESRD on HD MWF Nephrology consulted for HD. For HD today.   Hypertension BP well-controlled.  Not on meds currently.     Hyperlipidemia Continue atorvastatin  as above   CAD s/p CABG: On ASA, Brilinta  as above and atorvastatin    Anemia of chronic kidney disease Hemoglobin stable at 10 g baseline.  Mild tach  Recent Labs  Lab 04/26/24 0959 04/27/24 1758 04/29/24 1255 04/29/24 1302 04/30/24 0700  HGB 10.3* 10.8* 10.2* 10.5* 10.1*  HCT 31.3* 33.2* 33.2* 31.0* 31.3*    Chronic combined systolic and diastolic CHF Last echo was 4 days PTA EF 35-40%, G2 DD, mild reduced RV function.  MR, TR. Manage volume  with HD.Holding carvedilol    DM A1c 8.6 blood sugar fluctuating at times >200, continue well-controlled on SSI Recent  Labs  Lab 04/23/24 1927 04/23/24 1934 04/28/24 1125 04/29/24 1052 04/29/24 1723 04/29/24 2128 04/30/24 0627  GLUCAP  --    < > 156* 191* 173* 88 215*  HGBA1C 8.6*  --   --   --   --   --   --    < > = values in this interval not displayed.     History of brain aneurysm Stable on imaging   Depression Mood stable on mirtazapine  and trazodone    COPD Continue home albuterol    OSA Continue home CPAP   History of cataracts History of DVT Left eye blindness - Noted   DVT prophylaxis: heparin  injection 5,000 Units Start: 04/29/24 1530 Code Status:   Code Status: Full Code Family Communication: plan of care discussed with patient/ at bedside. Patient status is: Remains hospitalized because of severity of illness Level of care: Telemetry Medical   Dispo: The patient is from: home            Anticipated disposition: TBD-awaiting further neurovascular workup and intervention Objective: Vitals last 24 hrs: Vitals:   04/30/24 1223 04/30/24 1253 04/30/24 1311 04/30/24 1330  BP: 128/79 (!) 149/61 139/64 136/60  Pulse: (!) 57 62 (!) 57 (!) 57  Resp: 16 (!) 21 20 16   Temp: 97.6 F (36.4 C) 97.8 F (36.6 C)    TempSrc: Oral     SpO2: 100% 100% 100% 99%  Weight:  74.5 kg    Height:        Physical Examination: General exam: alert awake, older than stated age HEENT:Oral mucosa moist, Ear/Nose WNL grossly Respiratory system: Bilaterally clear BS, no use of accessory muscle Cardiovascular system: S1 & S2 +. Gastrointestinal system: Abdomen soft, NT,ND,BS+ Nervous System: Alert, awake, following commands.  Decreased sensation in left body. Extremities: LE edema neg, warm extremities, left elbow swollen tender, no erythema Skin: No rashes,warm. MSK: Normal muscle bulk/tone.   Data Reviewed: I have personally reviewed following labs and imaging studies ( see epic result tab) CBC: Recent Labs  Lab 04/25/24 0529 04/26/24 0959 04/27/24 1758 04/29/24 1255 04/29/24 1302  04/30/24 0700  WBC 8.3 8.2 7.8 10.0  --  9.4  NEUTROABS  --   --   --  7.3  --   --   HGB 10.5* 10.3* 10.8* 10.2* 10.5* 10.1*  HCT 32.1* 31.3* 33.2* 33.2* 31.0* 31.3*  MCV 88.9 89.7 91.0 95.1  --  91.8  PLT 172 190 240 267  --  278   CMP: Recent Labs  Lab 04/25/24 0529 04/26/24 0959 04/27/24 1758 04/29/24 1255 04/29/24 1302 04/30/24 0700  NA 133* 132* 135 135 134* 133*  K 3.9 4.4 4.2 4.6 4.6 4.9  CL 97* 95* 95* 96* 99 94*  CO2 22 22 25 24   --  23  GLUCOSE 143* 176* 139* 160* 159* 205*  BUN 43* 62* 36* 56* 51* 67*  CREATININE 7.09* 9.03* 5.83* 8.05* 8.10* 9.54*  CALCIUM  8.6* 8.7* 8.9 8.9  --  8.6*  MG 1.8  --   --   --   --   --   PHOS  --  5.8* 3.5  --   --   --    GFR: Estimated Creatinine Clearance: 6.4 mL/min (A) (by C-G formula based on SCr of 9.54 mg/dL (H)). Recent Labs  Lab 04/24/24 0616 04/26/24 0959 04/27/24 1758 04/29/24 1255 04/30/24 0700  AST 11*  --   --  12* 11*  ALT 9  --   --  10 10  ALKPHOS 74  --   --  79 79  BILITOT 0.9  --   --  0.9 0.4  PROT 7.2  --   --  7.3 7.1  ALBUMIN  3.4* 3.4* 3.4* 3.4* 3.3*   No results for input(s): "LIPASE", "AMYLASE" in the last 168 hours. No results for input(s): "AMMONIA" in the last 168 hours. Coagulation Profile:  Recent Labs  Lab 04/24/24 0616 04/29/24 1255  INR 1.1 1.1   Unresulted Labs (From admission, onward)     Start     Ordered   Pending  CBC  ONCE - STAT,   R       Question:  Specimen collection method  Answer:  Lab=Lab collect   Pending   Pending  Basic metabolic panel  ONCE - STAT,   R       Question:  Specimen collection method  Answer:  Lab=Lab collect   Pending   Pending  Protime-INR  ONCE - STAT,   R       Question:  Specimen collection method  Answer:  Lab=Lab collect   Pending           Antimicrobials/Microbiology: Anti-infectives (From admission, onward)    None         Component Value Date/Time   SDES BLOOD LEFT WRIST 02/10/2022 2205   SPECREQUEST  02/10/2022 2205     BOTTLES DRAWN AEROBIC AND ANAEROBIC Blood Culture adequate volume   CULT  02/10/2022 2205    NO GROWTH 5 DAYS Performed at [facility], [address] [place], [place] [address]    REPTSTATUS 02/15/2022 FINAL 02/10/2022 2205    Procedure(s) (LRB): RADIOLOGY WITH ANESTHESIA (N/A) Medications reviewed:  Scheduled Meds:  aspirin  EC  81 mg Oral Daily   atorvastatin   80 mg Oral q1800   Chlorhexidine  Gluconate Cloth  6 each Topical Q0600   heparin   5,000 Units Subcutaneous Q8H   insulin  aspart  0-15 Units Subcutaneous TID WC   mirtazapine   30 mg Oral QHS   ticagrelor   90 mg Oral BID   traZODone   25 mg Oral QHS   Continuous Infusions:  Lesa Rape, MD [facility] 04/30/2024, 1:54 PM

## 2024-04-30 NOTE — Procedures (Signed)
 I was present at the procedure, reviewed the HD regimen and made appropriate changes.   Larry Poag MD  [facility] 04/30/2024, 4:36 PM

## 2024-04-30 NOTE — Evaluation (Addendum)
 Physical Therapy Evaluation Patient Details Name: Terry Arroyo MRN: [id number] DOB: [birth year] Today's Date: 04/30/2024  History of Present Illness  Patient is a 63 year old female admitted with left side weakness, arm pain, and slurred speech. MRI (-) for CVA. Work up for TIA. Recent admission 5/5-5/10 acute ischemic MCA CVA secondary to left ICA stenosis treated with stenting and DAPT. MRI following intervention did not show infarct so the stroke was aborted by the intervention. Xray L elbow - Moderate elbow joint effusion; Possible left radial neck fracture. History of ESRD on HD, HTN, CAD, CVA, CHF, DVT, COPD, left eye blindness.    Clinical Impression  Patient is agreeable to PT evaluation. She reports she was only home for one day after recent discharge from the hospital. Her biggest complaint today is left elbow discomfort. She guards the left arm and maintains NWB of LUE with functional mobility. Today she required Min A for standing from the toilet and Min A for occasional unsteadiness with hallway ambulation. She is concerned she may not be able to manage at home with left arm limitations as she lives alone. Consider rehabilitation < 3 hours/day after this hospital stay if patient does not have adequate caregiver support at home. PT will continue to follow to maximize independence and facilitate return to prior level of function.       If plan is discharge home, recommend the following: A little help with walking and/or transfers;A little help with bathing/dressing/bathroom;Help with stairs or ramp for entrance;Assistance with cooking/housework   Can travel by private vehicle   Yes    Equipment Recommendations None recommended by PT  Recommendations for Other Services       Functional Status Assessment Patient has had a recent decline in their functional status and demonstrates the ability to make significant improvements in function in a reasonable and predictable amount of time.      Precautions / Restrictions Precautions Precautions: Fall Recall of Precautions/Restrictions: Intact Required Braces or Orthoses: Sling (LUE) Restrictions Weight Bearing Restrictions Per Provider Order: No Other Position/Activity Restrictions: presume NWB of LUE due to possible fracture      Mobility  Bed Mobility Overal bed mobility: Needs Assistance Bed Mobility: Supine to Sit     Supine to sit: Supervision     General bed mobility comments: increased time. patient maintains NWB of LUE with all mobility    Transfers Overall transfer level: Needs assistance Equipment used: 1 person hand held assist Transfers: Sit to/from Stand Sit to Stand: Min assist, Contact guard assist           General transfer comment: Min A for standing from toilet. CGA for standing from bed. she maintains NWB of LUE with all functional mobility    Ambulation/Gait Ambulation/Gait assistance: Min assist, Contact guard assist Gait Distance (Feet): 100 Feet Assistive device: 1 person hand held assist Gait Pattern/deviations: Step-through pattern, Staggering left Gait velocity: decreased     General Gait Details: patient has occasional unsteadiness with walking that required Min A, otherwise CGA for safety. patient reports both legs feel stiff  Stairs            Wheelchair Mobility     Tilt Bed    Modified Rankin (Stroke Patients Only)       Balance Overall balance assessment: Needs assistance Sitting-balance support: Feet supported Sitting balance-Leahy Scale: Good Sitting balance - Comments: patient able to reach outside base of support without loss of balance   Standing balance support: Single extremity supported,  No upper extremity supported Standing balance-Leahy Scale: Poor Standing balance comment: static standing is fair without UE support. dynamic balance required at least HHA with occasional Min A required for steadying                              Pertinent Vitals/Pain Pain Assessment Pain Assessment: 0-10 Pain Score: 2  Pain Location: L elbow Pain Descriptors / Indicators: Guarding Pain Intervention(s): Monitored during session (no sling in the room, contactetd ortho tech)    Home Living Family/patient expects to be discharged to:: Private residence Living Arrangements: Alone Available Help at Discharge: Family;Friend(s);Available PRN/intermittently Type of Home: Apartment Home Access: Elevator       Home Layout: One level Home Equipment: Rollator (4 wheels);Cane - single point;Shower seat;BSC/3in1 Additional Comments: had home health set-up following previous hosptial stay    Prior Function Prior Level of Function : Independent/Modified Independent             Mobility Comments: takes bus to HD, has groceries delivered. was only home one day and reports using rollator for ambulation ADLs Comments: reports neighbors assist with (vacumming) but otherwise independent.  meds are delievered     Extremity/Trunk Assessment   Upper Extremity Assessment Upper Extremity Assessment: Left hand dominant;LUE deficits/detail;Defer to OT evaluation LUE Deficits / Details: left elbow is flexed across her abdomen for comfort. grip strength is fair. did not assess shoulder or elbow ROM due to pain in the L eblow with possible radial head fracture per imaging report    Lower Extremity Assessment Lower Extremity Assessment: RLE deficits/detail;LLE deficits/detail RLE Deficits / Details: 5/5 dorsiflexion/plantarflexion. knee extension 4+/5 RLE Sensation: history of peripheral neuropathy LLE Deficits / Details: 5/5 dorsiflexion/plantarflexion. knee extension 5/5       Communication   Communication Communication: No apparent difficulties    Cognition Arousal: Alert Behavior During Therapy: WFL for tasks assessed/performed   PT - Cognitive impairments: Memory, Orientation   Orientation impairments: Time                    PT - Cognition Comments: patient is disoriented to time of day and has some confusion about recent events Following commands: Intact       Cueing Cueing Techniques: Verbal cues     General Comments General comments (skin integrity, edema, etc.): encouraged patient to have LUE supported on a pillow for comfort. she reports pain is increased in the elbow with active movement    Exercises     Assessment/Plan    PT Assessment Patient needs continued PT services  PT Problem List Decreased strength;Decreased range of motion;Decreased activity tolerance;Decreased balance;Decreased mobility;Decreased cognition;Decreased knowledge of use of DME;Decreased safety awareness;Pain       PT Treatment Interventions DME instruction;Gait training;Stair training;Functional mobility training;Therapeutic activities;Therapeutic exercise;Balance training;Neuromuscular re-education;Cognitive remediation;Patient/family education    PT Goals (Current goals can be found in the Care Plan section)  Acute Rehab PT Goals Patient Stated Goal: improved elbow pain PT Goal Formulation: With patient Time For Goal Achievement: 05/14/24 Potential to Achieve Goals: Good    Frequency Min 2X/week     Co-evaluation               AM-PAC PT "6 Clicks" Mobility  Outcome Measure Help needed turning from your back to your side while in a flat bed without using bedrails?: A Little Help needed moving from lying on your back to sitting on the side of a flat  bed without using bedrails?: A Little Help needed moving to and from a bed to a chair (including a wheelchair)?: A Little Help needed standing up from a chair using your arms (e.g., wheelchair or bedside chair)?: A Little Help needed to walk in hospital room?: A Little Help needed climbing 3-5 steps with a railing? : A Little 6 Click Score: 18    End of Session   Activity Tolerance: Patient tolerated treatment well Patient left: in bed;with call  bell/phone within reach;with bed alarm set (seated on bed per patient report) Nurse Communication: Mobility status (I contacted ortho tech about the sling) PT Visit Diagnosis: Unsteadiness on feet (R26.81);Pain Pain - Right/Left: Left Pain - part of body: Arm    Time: 8416-6063 PT Time Calculation (min) (ACUTE ONLY): 23 min   Charges:   PT Evaluation $PT Eval Moderate Complexity: 1 Mod   PT General Charges $$ ACUTE PT VISIT: 1 Visit         Ozie Bo, PT, MPT   Erlene Hawks 04/30/2024, 9:27 AM

## 2024-04-30 NOTE — Consult Note (Signed)
 Reason for Consult:Left elbow pain Referring Physician: Lesa Rape Time called: 1247 Time at bedside: [address]   Terry Arroyo is an 63 y.o. female.  HPI: Terry Arroyo was admitted yesterday with focal neurologic deficits including left-sided weakness. She also c/o pain in the LUE, esp elbow. She's a little vague on onset, beginning maybe when she came back to hospital the last time. She describes pain with straightening the arm and is really unable to use it. X-rays showed a radial head abnormality and also a likely contrast extrac in the distal upper arm and orthopedic surgery was consulted. She is LHD.  Past Medical History:  Diagnosis Date   ACS (acute coronary syndrome) (HCC) 03/24/2019   Acute ischemic left MCA stroke (HCC) 04/23/2024   Acute on chronic systolic CHF (congestive heart failure) (HCC) 02/26/2020   Acute pain of left thigh 02/09/2022   Acute pulmonary edema (HCC)    Acute renal failure superimposed on stage 4 chronic kidney disease (HCC)    Anasarca 02/26/2020   Anemia    Angina at rest [facility])    Angioedema 03/20/2021   Lisinopril    Anterior cerebral aneurysm    approximately 5 mm; left MCA    Anxiety    Aortic atherosclerosis (HCC)    Asthma    Benign hypertensive kidney disease with chronic kidney disease 03/10/2020   Blind left eye    CHF (congestive heart failure) (HCC)    COPD (chronic obstructive pulmonary disease) (HCC)    COPD exacerbation (HCC) 10/10/2020   Coronary artery disease    COVID-19 virus infection 03/24/2019   Depression    DOE (dyspnea on exertion)    DVT (deep venous thrombosis) (HCC) 12/30/2016   Left leg dvt as per pt while she was in hospital for cabg  Is on eliquis since august  Doppler done in [place]  Shows   acute  occlusive deep vein  thrombosis  noted in the soleal  vein (a short  segment) at the  mid calf  3/18 doppler is negative     Last Assessment & Plan:   Will rpt doppler   If negative will stop eliquis  Formatting of this note might  be different from the original.  Left leg dv   ESRD (end stage renal disease) (HCC)    Gait instability    Genital herpes    GERD (gastroesophageal reflux disease)    HFrEF (heart failure with reduced ejection fraction) (HCC)    History of 2019 novel coronavirus disease (COVID-19) 03/2019   [facility] [place] [facility]    History of DVT (deep vein thrombosis)    History of DVT (deep vein thrombosis)    Hx of CABG 2018   x 4; performed in [place]    Hyperlipidemia    Hypertension    Hypertensive urgency 11/24/2019   MI (myocardial infarction) (HCC) 2018   no stents   Pituitary adenoma (HCC)    PVD (posterior vitreous detachment), right eye    Secondary hyperparathyroidism (HCC)    Sepsis (HCC) 09/05/2021   Sleep apnea    non-compliant with nocturnal PAP therapy   Stroke [facility]) 2014   stated affected left side.   T2DM (type 2 diabetes mellitus) [facility])     Past Surgical History:  Procedure Laterality Date   A/V FISTULAGRAM Right 12/29/2021   Procedure: A/V FISTULAGRAM;  Surgeon: Jackquelyn Mass, MD;  Location: [facility] INVASIVE CV LAB;  Service: Cardiovascular;  Laterality: Right;   A/V FISTULAGRAM Right 06/15/2022  Procedure: A/V Fistulagram;  Surgeon: Jackquelyn Mass, MD;  Location: [facility] INVASIVE CV LAB;  Service: Cardiovascular;  Laterality: Right;   A/V FISTULAGRAM Right 04/17/2024   Procedure: A/V Fistulagram;  Surgeon: Jackquelyn Mass, MD;  Location: [facility] INVASIVE CV LAB;  Service: Cardiovascular;  Laterality: Right;   AV FISTULA PLACEMENT Right 01/28/2021   Procedure: ARTERIOVENOUS (AV) FISTULA CREATION;  Surgeon: Jackquelyn Mass, MD;  Location: [facility] ORS;  Service: Vascular;  Laterality: Right;   AV FISTULA PLACEMENT Right 05/13/2021   Procedure: INSERTION OF ARTERIOVENOUS (AV) GORE-TEX GRAFT ARM ( BRACHIAL AXILLARY );  Surgeon: Jackquelyn Mass, MD;  Location: [facility] ORS;  Service: Vascular;  Laterality: Right;   BRAIN SURGERY  1986   pituitary tumor   BREAST  EXCISIONAL BIOPSY Left yrs ago   benign   CESAREAN SECTION     COLONOSCOPY N/A 05/26/2023   Procedure: COLONOSCOPY;  Surgeon: Quintin Buckle, DO;  Location: [facility] ENDOSCOPY;  Service: Gastroenterology;  Laterality: N/A;  DIALYSIS   CORONARY ARTERY BYPASS GRAFT  2018   x 4 vessels; performed in [place]    DIALYSIS/PERMA CATHETER INSERTION N/A 11/07/2020   Procedure: DIALYSIS/PERMA CATHETER INSERTION;  Surgeon: Celso College, MD;  Location: [facility] INVAS[facility] CV LAB;  Service: Cardiovascular;  Laterality: N/A;   DIALYSIS/PERMA CATHETER REMOVAL N/A 06/12/2021   Procedure: DIALYSIS/PERMA CATHETER REMOVAL;  Surgeon: Celso College, MD;  Location: [facility] INV[facility] CV [facility];  Service: Cardiovascular;  Laterality: N/A;   ESOPHAGOGASTRODUODENOSCOPY (EGD) WITH PROPOFOL  N/A 12/22/2021   Procedure: ESOPHAGOGASTRODUODENOSCOPY (EGD) WITH PROPOFOL ;  Surgeon: Shane Darling, MD;  Location: [facility] ENDOSCOPY;  Service: Gastroenterology;  Laterality: N/A;  IDDM   EYE SURGERY Right 2021   cataracts    IR CT HEAD LTD  04/23/2024   IR PERCUTANEOUS ART THROMBECTOMY/INFUSION INTRACRANIAL INC DIAG ANGIO  04/23/2024   IR US  GUIDE VASC ACCESS RIGHT  04/23/2024   PERIPHERAL VASCULAR THROMBECTOMY Right 04/03/2021   Procedure: A/V Fistulagram ;  Surgeon: Jackquelyn Mass, MD;  Location: [facility] INVASIVE CV LAB;  Service: Cardiovascular;  Laterality: Right;   RADIOLOGY WITH ANESTHESIA N/A 04/23/2024   Procedure: RADIOLOGY WITH ANESTHESIA;  Surgeon: Radiologist, Medication, MD;  Location: [facility] OR;  Service: Radiology;  Laterality: N/A;    Family History  Problem Relation Age of Onset   CAD Mother    CAD Brother    Breast cancer Sister 38    Social History:  reports that she quit smoking about 7 years ago. Her smoking use included cigarettes. She has never used smokeless tobacco. She reports that she does not currently use alcohol . She reports that she does not currently use drugs.  Allergies:  Allergies  Allergen Reactions    Hydralazine  Hives, Shortness Of Breath, Swelling and Rash    Body aches, also    Kiwi Extract Hives, Swelling, Rash and Other (See Comments)    Mouth swells    Lisinopril  Swelling and Other (See Comments)    Angioedema   Shellfish Allergy Anaphylaxis and Other (See Comments)    Shrimp/lobster    Betadine [Povidone Iodine ] Hives and Rash   Septra [Sulfamethoxazole-Trimethoprim] Other (See Comments)    Reaction not stated   Metformin Diarrhea and Other (See Comments)    GI Intolerance   Tape Itching    Use paper tape whenever possible    Medications: I have reviewed the patient's current medications.  Results for orders placed or performed during the hospital encounter of 04/29/24 (from the past 48 hours)  CBG monitoring,  ED     Status: Abnormal   Collection Time: 04/29/24 10:52 AM  Result Value Ref Range   Glucose-Capillary 191 (H) 70 - 99 mg/dL    Comment: Glucose reference range applies only to samples taken after fasting for at least 8 hours.  Protime-INR     Status: None   Collection Time: 04/29/24 12:55 PM  Result Value Ref Range   Prothrombin Time 14.7 11.4 - 15.2 seconds   INR 1.1 0.8 - 1.2    Comment: (NOTE) INR goal varies based on device and disease states. Performed at [facility], [address], [place], [place] [address]   APTT     Status: None   Collection Time: 04/29/24 12:55 PM  Result Value Ref Range   aPTT 32 24 - 36 seconds    Comment: Performed at [facility], [address] [place], [place] [address]  CBC     Status: Abnormal   Collection Time: 04/29/24 12:55 PM  Result Value Ref Range   WBC 10.0 4.0 - 10.5 K/uL   RBC 3.49 (L) 3.87 - 5.11 MIL/uL   Hemoglobin 10.2 (L) 12.0 - 15.0 g/dL   HCT 82.9 (L) 56.2 - 13.0 %   MCV 95.1 80.0 - 100.0 fL   MCH 29.2 26.0 - 34.0 pg   MCHC 30.7 30.0 - 36.0 g/dL   RDW 86.5 78.4 - 69.6 %   Platelets 267 150 - 400 K/uL   nRBC 0.0 0.0 - 0.2 %    Comment: Performed at [facility], [address] [place], [place] [address]  Differential     Status: None   Collection Time: 04/29/24 12:55 PM  Result Value Ref Range   Neutrophils Relative % 72 %   Neutro Abs 7.3 1.7 - 7.7 K/uL   Lymphocytes Relative 14 %   Lymphs Abs 1.4 0.7 - 4.0 K/uL   Monocytes Relative 8 %   Monocytes Absolute 0.8 0.1 - 1.0 K/uL   Eosinophils Relative 4 %   Eosinophils Absolute 0.4 0.0 - 0.5 K/uL   Basophils Relative 1 %   Basophils Absolute 0.1 0.0 - 0.1 K/uL   Immature Granulocytes 1 %   Abs Immature Granulocytes 0.05 0.00 - 0.07 K/uL    Comment: Performed at [facility], [address], [place], [place] [address]  Comprehensive metabolic panel     Status: Abnormal   Collection Time: 04/29/24 12:55 PM  Result Value Ref Range   Sodium 135 135 - 145 mmol/L   Potassium 4.6 3.5 - 5.1 mmol/L   Chloride 96 (L) 98 - 111 mmol/L   CO2 24 22 - 32 mmol/L   Glucose, Bld 160 (H) 70 - 99 mg/dL    Comment: Glucose reference range applies only to samples taken after fasting for at least 8 hours.   BUN 56 (H) 8 - 23 mg/dL   Creatinine, Ser 4.01 (H) 0.44 - 1.00 mg/dL   Calcium  8.9 8.9 - 10.3 mg/dL   Total Protein 7.3 6.5 - 8.1 g/dL   Albumin  3.4 (L) 3.5 - 5.0 g/dL   AST 12 (L) 15 - 41 U/L   ALT 10 0 - 44 U/L   Alkaline Phosphatase 79 38 - 126 U/L   Total Bilirubin 0.9 0.0 - 1.2 mg/dL   GFR, Estimated 5 (L) >60 mL/min    Comment: (NOTE) Calculated using the CKD-EPI Creatinine Equation (2021)    Anion gap 15 5 - 15  Comment: Performed at [facility], [address] [place], [place] [address]  Ethanol     Status: None   Collection Time: 04/29/24 12:55 PM  Result Value Ref Range   Alcohol , Ethyl (B) <15 <15 mg/dL    Comment: Please note change in reference range. (NOTE) For medical purposes only. Performed at [facility], [address], [place], [place] [address]   I-stat chem 8, ED     Status: Abnormal   Collection Time: 04/29/24  1:02 PM  Result Value Ref Range   Sodium 134 (L)  135 - 145 mmol/L   Potassium 4.6 3.5 - 5.1 mmol/L   Chloride 99 98 - 111 mmol/L   BUN 51 (H) 8 - 23 mg/dL   Creatinine, Ser 8.29 (H) 0.44 - 1.00 mg/dL   Glucose, Bld 562 (H) 70 - 99 mg/dL    Comment: Glucose reference range applies only to samples taken after fasting for at least 8 hours.   Calcium , Ion 1.01 (L) 1.15 - 1.40 mmol/L   TCO2 23 22 - 32 mmol/L   Hemoglobin 10.5 (L) 12.0 - 15.0 g/dL   HCT 13.0 (L) 86.5 - 78.4 %  CBG monitoring, ED     Status: Abnormal   Collection Time: 04/29/24  5:23 PM  Result Value Ref Range   Glucose-Capillary 173 (H) 70 - 99 mg/dL    Comment: Glucose reference range applies only to samples taken after fasting for at least 8 hours.  Glucose, capillary     Status: None   Collection Time: 04/29/24  9:28 PM  Result Value Ref Range   Glucose-Capillary 88 70 - 99 mg/dL    Comment: Glucose reference range applies only to samples taken after fasting for at least 8 hours.  Glucose, capillary     Status: Abnormal   Collection Time: 04/30/24  6:27 AM  Result Value Ref Range   Glucose-Capillary 215 (H) 70 - 99 mg/dL    Comment: Glucose reference range applies only to samples taken after fasting for at least 8 hours.   Comment 1 Notify RN   CBC     Status: Abnormal   Collection Time: 04/30/24  7:00 AM  Result Value Ref Range   WBC 9.4 4.0 - 10.5 K/uL   RBC 3.41 (L) 3.87 - 5.11 MIL/uL   Hemoglobin 10.1 (L) 12.0 - 15.0 g/dL   HCT 69.6 (L) 29.5 - 28.4 %   MCV 91.8 80.0 - 100.0 fL   MCH 29.6 26.0 - 34.0 pg   MCHC 32.3 30.0 - 36.0 g/dL   RDW 13.2 44.0 - 10.2 %   Platelets 278 150 - 400 K/uL   nRBC 0.0 0.0 - 0.2 %    Comment: Performed at [facility], [address] [place], [place] [address]  Comprehensive metabolic panel with GFR     Status: Abnormal   Collection Time: 04/30/24  7:00 AM  Result Value Ref Range   Sodium 133 (L) 135 - 145 mmol/L   Potassium 4.9 3.5 - 5.1 mmol/L   Chloride 94 (L) 98 - 111 mmol/L   CO2 23 22 - 32 mmol/L   Glucose,  Bld 205 (H) 70 - 99 mg/dL    Comment: Glucose reference range applies only to samples taken after fasting for at least 8 hours.   BUN 67 (H) 8 - 23 mg/dL   Creatinine, Ser 6.44 (H) 0.44 - 1.00 mg/dL   Calcium  8.6 (L) 8.9 - 10.3 mg/dL  Total Protein 7.1 6.5 - 8.1 g/dL   Albumin  3.3 (L) 3.5 - 5.0 g/dL   AST 11 (L) 15 - 41 U/L   ALT 10 0 - 44 U/L   Alkaline Phosphatase 79 38 - 126 U/L   Total Bilirubin 0.4 0.0 - 1.2 mg/dL   GFR, Estimated 4 (L) >60 mL/min    Comment: (NOTE) Calculated using the CKD-EPI Creatinine Equation (2021)    Anion gap 16 (H) 5 - 15    Comment: Performed at [facility], [address], [place], [place] [address]    VAS US  CAROTID Result Date: 04/30/2024 Carotid Arterial Duplex Study Patient Name:  Terry Arroyo  Date of Exam:   04/30/2024 Medical Rec #: [id number]        Accession #:    [id number] Date of Birth: March 10, 1961         Patient Gender: F Patient Age:   63 years Exam Location:  [facility] Procedure:      VAS US  CAROTID Referring Phys: Authur Leghorn MELVIN --------------------------------------------------------------------------------  Indications:       CVA. Risk Factors:      Hypertension, hyperlipidemia, Diabetes. Other Factors:     Left ICA stent. Comparison Study:  04/25/2024 - Right Carotid: Velocities in the right ICA are                    consistent with an upper range 1-39% stenosis.                     Left Carotid: Left ICA stent is patent with elevated                    velocities suggestive                    of 50-74% stenosis.                     Vertebrals: Bilateral vertebral arteries demonstrate                    antegrade flow.                    Subclavians: Right subclavian artery was stenotic. Normal                    flow                    hemodynamics were seen in the left subclavian artery. Performing Technologist: Lerry Ransom RVT  Examination Guidelines: A complete evaluation includes B-mode imaging, spectral Doppler, color  Doppler, and power Doppler as needed of all accessible portions of each vessel. Bilateral testing is considered an integral part of a complete examination. Limited examinations for reoccurring indications may be performed as noted.  Right Carotid Findings: +----------+--------+--------+--------+--------------------------+--------+           PSV cm/sEDV cm/sStenosisPlaque Description        Comments +----------+--------+--------+--------+--------------------------+--------+ CCA Prox  123     13              smooth and heterogenous            +----------+--------+--------+--------+--------------------------+--------+ CCA Distal54      9               irregular and heterogenous         +----------+--------+--------+--------+--------------------------+--------+ ICA Prox  96  19              irregular and heterogenous         +----------+--------+--------+--------+--------------------------+--------+ ICA Mid   157     44      40-59%  irregular and heterogenous         +----------+--------+--------+--------+--------------------------+--------+ ICA Distal107     26                                        tortuous +----------+--------+--------+--------+--------------------------+--------+ ECA       95      1                                                  +----------+--------+--------+--------+--------------------------+--------+ +----------+--------+-------+--------+-------------------+           PSV cm/sEDV cmsDescribeArm Pressure (mmHG) +----------+--------+-------+--------+-------------------+ [id number]     106                                +----------+--------+-------+--------+-------------------+ +---------+--------+--+--------+--+---------+ VertebralPSV cm/s43EDV cm/s10Antegrade +---------+--------+--+--------+--+---------+ Elevated velocities are noted in the subclavian artery. Left Carotid Findings:  +----------+--------+--------+--------+-----------------------+--------+           PSV cm/sEDV cm/sStenosisPlaque Description     Comments +----------+--------+--------+--------+-----------------------+--------+ CCA Prox  73      11              smooth and heterogenous         +----------+--------+--------+--------+-----------------------+--------+ CCA Distal66      12              smooth and heterogenous         +----------+--------+--------+--------+-----------------------+--------+ ICA Prox  76      17                                     Stent    +----------+--------+--------+--------+-----------------------+--------+ ICA Mid   112     31                                     Stent    +----------+--------+--------+--------+-----------------------+--------+ ICA Distal111     24                                     Stent    +----------+--------+--------+--------+-----------------------+--------+ ECA       63      10                                              +----------+--------+--------+--------+-----------------------+--------+ +----------+--------+--------+--------+-------------------+           PSV cm/sEDV cm/sDescribeArm Pressure (mmHG) +----------+--------+--------+--------+-------------------+ Subclavian131                                         +----------+--------+--------+--------+-------------------+ +---------+--------+--+--------+--+---------+ VertebralPSV cm/s66EDV  cm/s17Antegrade +---------+--------+--+--------+--+---------+  Left Stent(s): +---------------+---+--++++ Prox to Stent  61 11 +---------------+---+--++++ Proximal Stent 76 17 +---------------+---+--++++ Mid Stent      11231 +---------------+---+--++++ Distal Stent   11124 +---------------+---+--++++ Distal to Stent54 20 +---------------+---+--++++    Summary: Right Carotid: Velocities in the right ICA are consistent with a 40-59%                 stenosis. Left Carotid: Left ICA stent is patent with less than 50% stenosis by velocity. Vertebrals: Bilateral vertebral arteries demonstrate antegrade flow. *See table(s) above for measurements and observations.     Preliminary    DG Elbow 2 Views Left Result Date: 04/29/2024 CLINICAL DATA:  Pain elbow pain EXAM: LEFT ELBOW - 2 VIEW COMPARISON:  CTA chest, abdomen, and pelvis dated 04/23/2024 FINDINGS: Ill-defined density dispersed through the subcutaneous soft tissues of the partially imaged upper arm overlying the ulnar aspect of the humerus. No acute dislocation. Subtle cortical angulation at the level of the radial neck. Moderate elbow joint effusion. IMPRESSION: 1. Subtle cortical angulation at the level of the radial neck, which may represent a nondisplaced fracture in the appropriate clinical setting. 2. Moderate elbow joint effusion. 3. Ill-defined density dispersed through the subcutaneous soft tissues of the partially imaged upper arm overlying the ulnar aspect of the humerus, which may represent extravasated contrast material. The patient underwent a contrast-enhanced CT examination on 04/23/2024 with contrast instillation via the left upper extremity. Electronically Signed   By: Limin  Xu M.D.   On: 04/29/2024 13:22   MR BRAIN WO CONTRAST Result Date: 04/29/2024 CLINICAL DATA:  Neuro deficit, acute, stroke suspected EXAM: MRI HEAD WITHOUT CONTRAST MRA HEAD WITHOUT CONTRAST MRA NECK WITHOUT CONTRAST TECHNIQUE: Multiplanar, multiecho pulse sequences of the brain and surrounding structures were obtained without intravenous contrast. Angiographic images of the Circle of Willis were obtained using MRA technique without intravenous contrast. Angiographic images of the neck were obtained using MRA technique without intravenous contrast. Carotid stenosis measurements (when applicable) are obtained utilizing NASCET criteria, using the distal internal carotid diameter as the denominator. COMPARISON:   CTA 04/23/2024, MRI head 12/07/2023 FINDINGS: MRI HEAD Motion artifact. Brain: There is no acute infarction or intracranial hemorrhage. There is no intracranial mass, mass effect, or edema. There is no hydrocephalus or extra-axial fluid collection. Ventricles and sulci are stable in size and configuration. Patchy foci of T2 hyperintensity in the supratentorial white matter are nonspecific but may reflect stable mild chronic microvascular ischemic changes. Vascular: Major vessel flow voids at the skull base are preserved. Skull and upper cervical spine: Normal marrow signal is preserved. Sinuses/Orbits: Postoperative changes with minimal patchy mucosal thickening. Right lens replacement. Other: Evidence of prior sellar mass resection minimal patchy mastoid fluid opacification. Retention cysts along the posterior wall of the nasopharynx. MRA HEAD Motion artifact. Intracranial internal carotid arteries are patent. Atherosclerotic irregularity with stenosis. There is up to marked stenosis of the right cavernous ICA and moderate marked stenosis of the left paraclinoid ICA, likely similar to prior CTA. Middle and anterior cerebral arteries are patent. 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 Intracranial vertebral arteries, basilar artery, posterior cerebral arteries are patent. Bilateral posterior communicating arteries are present. No significant stenosis. MRA NECK Motion artifact. Right common, internal, and external carotid arteries are patent. There is plaque without hemodynamically significant stenosis at the ICA origin. Subsequent circumferential narrowing of the proximal ICA causing greater than 70% stenosis. Likely no significant change from the prior CTA. Left common carotid artery is patent.  There is loss of flow at the distal CCA and proximal ICA that may be related to interval stent placement. Mid to distal cervical ICA is patent. Vertebral arteries are patent. There is limited evaluation  proximally due to artifact. No significant stenosis identified. IMPRESSION: Degraded by motion. No acute infarction, hemorrhage, or mass. No significant change since the prior brain MRI. Loss of flow related enhancement of the distal CCA and proximal ICA is probably related to artifact from interval stent. Greater than 70% stenosis of the proximal right ICA is probably similar to the prior CTA. Right greater than left intracranial ICA atherosclerosis and stenosis is likely similar to the prior CTA. 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 Electronically Signed   By: Geannie Keener M.D.   On: 04/29/2024 12:38   MR ANGIO HEAD WO CONTRAST Result Date: 04/29/2024 CLINICAL DATA:  Neuro deficit, acute, stroke suspected EXAM: MRI HEAD WITHOUT CONTRAST MRA HEAD WITHOUT CONTRAST MRA NECK WITHOUT CONTRAST TECHNIQUE: Multiplanar, multiecho pulse sequences of the brain and surrounding structures were obtained without intravenous contrast. Angiographic images of the Circle of Willis were obtained using MRA technique without intravenous contrast. Angiographic images of the neck were obtained using MRA technique without intravenous contrast. Carotid stenosis measurements (when applicable) are obtained utilizing NASCET criteria, using the distal internal carotid diameter as the denominator. COMPARISON:  CTA 04/23/2024, MRI head 12/07/2023 FINDINGS: MRI HEAD Motion artifact. Brain: There is no acute infarction or intracranial hemorrhage. There is no intracranial mass, mass effect, or edema. There is no hydrocephalus or extra-axial fluid collection. Ventricles and sulci are stable in size and configuration. Patchy foci of T2 hyperintensity in the supratentorial white matter are nonspecific but may reflect stable mild chronic microvascular ischemic changes. Vascular: Major vessel flow voids at the skull base are preserved. Skull and upper cervical spine: Normal marrow signal is preserved. Sinuses/Orbits:  Postoperative changes with minimal patchy mucosal thickening. Right lens replacement. Other: Evidence of prior sellar mass resection minimal patchy mastoid fluid opacification. Retention cysts along the posterior wall of the nasopharynx. MRA HEAD Motion artifact. Intracranial internal carotid arteries are patent. Atherosclerotic irregularity with stenosis. There is up to marked stenosis of the right cavernous ICA and moderate marked stenosis of the left paraclinoid ICA, likely similar to prior CTA. Middle and anterior cerebral arteries are patent. 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 Intracranial vertebral arteries, basilar artery, posterior cerebral arteries are patent. Bilateral posterior communicating arteries are present. No significant stenosis. MRA NECK Motion artifact. Right common, internal, and external carotid arteries are patent. There is plaque without hemodynamically significant stenosis at the ICA origin. Subsequent circumferential narrowing of the proximal ICA causing greater than 70% stenosis. Likely no significant change from the prior CTA. Left common carotid artery is patent. There is loss of flow at the distal CCA and proximal ICA that may be related to interval stent placement. Mid to distal cervical ICA is patent. Vertebral arteries are patent. There is limited evaluation proximally due to artifact. No significant stenosis identified. IMPRESSION: Degraded by motion. No acute infarction, hemorrhage, or mass. No significant change since the prior brain MRI. Loss of flow related enhancement of the distal CCA and proximal ICA is probably related to artifact from interval stent. Greater than 70% stenosis of the proximal right ICA is probably similar to the prior CTA. Right greater than left intracranial ICA atherosclerosis and stenosis is likely similar to the prior CTA. 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 Electronically Signed  By: Praneil  Patel M.D.    On: 04/29/2024 12:38   MR ANGIO NECK WO CONTRAST Result Date: 04/29/2024 CLINICAL DATA:  Neuro deficit, acute, stroke suspected EXAM: MRI HEAD WITHOUT CONTRAST MRA HEAD WITHOUT CONTRAST MRA NECK WITHOUT CONTRAST TECHNIQUE: Multiplanar, multiecho pulse sequences of the brain and surrounding structures were obtained without intravenous contrast. Angiographic images of the Circle of Willis were obtained using MRA technique without intravenous contrast. Angiographic images of the neck were obtained using MRA technique without intravenous contrast. Carotid stenosis measurements (when applicable) are obtained utilizing NASCET criteria, using the distal internal carotid diameter as the denominator. COMPARISON:  CTA 04/23/2024, MRI head 12/07/2023 FINDINGS: MRI HEAD Motion artifact. Brain: There is no acute infarction or intracranial hemorrhage. There is no intracranial mass, mass effect, or edema. There is no hydrocephalus or extra-axial fluid collection. Ventricles and sulci are stable in size and configuration. Patchy foci of T2 hyperintensity in the supratentorial white matter are nonspecific but may reflect stable mild chronic microvascular ischemic changes. Vascular: Major vessel flow voids at the skull base are preserved. Skull and upper cervical spine: Normal marrow signal is preserved. Sinuses/Orbits: Postoperative changes with minimal patchy mucosal thickening. Right lens replacement. Other: Evidence of prior sellar mass resection minimal patchy mastoid fluid opacification. Retention cysts along the posterior wall of the nasopharynx. MRA HEAD Motion artifact. Intracranial internal carotid arteries are patent. Atherosclerotic irregularity with stenosis. There is up to marked stenosis of the right cavernous ICA and moderate marked stenosis of the left paraclinoid ICA, likely similar to prior CTA. Middle and anterior cerebral arteries are patent. 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 Intracranial  vertebral arteries, basilar artery, posterior cerebral arteries are patent. Bilateral posterior communicating arteries are present. No significant stenosis. MRA NECK Motion artifact. Right common, internal, and external carotid arteries are patent. There is plaque without hemodynamically significant stenosis at the ICA origin. Subsequent circumferential narrowing of the proximal ICA causing greater than 70% stenosis. Likely no significant change from the prior CTA. Left common carotid artery is patent. There is loss of flow at the distal CCA and proximal ICA that may be related to interval stent placement. Mid to distal cervical ICA is patent. Vertebral arteries are patent. There is limited evaluation proximally due to artifact. No significant stenosis identified. IMPRESSION: Degraded by motion. No acute infarction, hemorrhage, or mass. No significant change since the prior brain MRI. Loss of flow related enhancement of the distal CCA and proximal ICA is probably related to artifact from interval stent. Greater than 70% stenosis of the proximal right ICA is probably similar to the prior CTA. Right greater than left intracranial ICA atherosclerosis and stenosis is likely similar to the prior CTA. 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 Electronically Signed   By: Geannie Keener M.D.   On: 04/29/2024 12:38   CT HEAD CODE STROKE WO CONTRAST Result Date: 04/29/2024 CLINICAL DATA:  Code stroke.  Left-sided weakness EXAM: CT HEAD WITHOUT CONTRAST TECHNIQUE: Contiguous axial images were obtained from the base of the skull through the vertex without intravenous contrast. RADIATION DOSE REDUCTION: This exam was performed according to the departmental dose-optimization program which includes automated exposure control, adjustment of the mA and/or kV according to patient size and/or use of iterative reconstruction technique. COMPARISON:  Six days prior FINDINGS: Brain: No evidence of acute infarction,  hemorrhage, hydrocephalus, extra-axial collection or mass lesion/mass effect. Extensive dural calcification Vascular: No hyperdense vessel. Scattered areas of calcification within the superior sagittal and left  transverse sigmoid dural sinuses. Skull: Normal. Negative for fracture or focal lesion. Sinuses/Orbits: Chronic sinusitis with prior endoscopic sinus surgery Other: Prelim sent in epic chat ASPECTS [facility] Stroke Program Early CT Score) - Ganglionic level infarction (caudate, lentiform nuclei, internal capsule, insula, M1-M3 cortex): 7 - Supraganglionic infarction (M4-M6 cortex): 3 Total score (0-10 with 10 being normal): 10 IMPRESSION: No acute or interval finding. Electronically Signed   By: Ronnette Coke M.D.   On: 04/29/2024 11:08    Review of Systems  HENT:  Negative for ear discharge, ear pain, hearing loss and tinnitus.   Eyes:  Negative for photophobia and pain.  Respiratory:  Negative for cough and shortness of breath.   Cardiovascular:  Negative for chest pain.  Gastrointestinal:  Negative for abdominal pain, nausea and vomiting.  Genitourinary:  Negative for dysuria, flank pain, frequency and urgency.  Musculoskeletal:  Positive for arthralgias (Left elbow). Negative for back pain, myalgias and neck pain.  Neurological:  Negative for dizziness and headaches.  Hematological:  Does not bruise/bleed easily.  Psychiatric/Behavioral:  The patient is not nervous/anxious.    Blood pressure 136/60, pulse (!) 57, temperature 97.8 F (36.6 C), resp. rate 16, height 5\' 6"  (1.676 m), weight 74.5 kg, SpO2 99%. Physical Exam Constitutional:      General: She is not in acute distress.    Appearance: She is well-developed. She is not diaphoretic.  HENT:     Head: Normocephalic and atraumatic.  Eyes:     General: No scleral icterus.       Right eye: No discharge.        Left eye: No discharge.     Conjunctiva/sclera: Conjunctivae normal.  Cardiovascular:     Rate and Rhythm: Normal  rate and regular rhythm.  Pulmonary:     Effort: Pulmonary effort is normal. No respiratory distress.  Musculoskeletal:     Cervical back: Normal range of motion.     Comments: Left shoulder, elbow, wrist, digits- no skin wounds, mod diffuse TTP elbow, painless PROM through about 15 degrees then mod pain, no instability, no blocks to motion  Sens  Ax/R/M/U intact  Mot   Ax/ R/ PIN/ M/ AIN/ U intact  Rad 2+  Skin:    General: Skin is warm and dry.  Neurological:     Mental Status: She is alert.  Psychiatric:        Mood and Affect: Mood normal.        Behavior: Behavior normal.     Assessment/Plan: Left elbow pain -- The likely contrast extrav seems very close to onset of her pain; that's a very likely contender. Not sure what to make of the radial head so will get CT. Pt adamantly denies any sort of trauma. Continue sling for comfort at this point.    Georganna Kin, PA-C Orthopedic Surgery [phone] 04/30/2024, 2:35 PM

## 2024-05-01 ENCOUNTER — Inpatient Hospital Stay (HOSPITAL_COMMUNITY)

## 2024-05-01 ENCOUNTER — Encounter (HOSPITAL_COMMUNITY)
Admission: EM | Disposition: A | Discharge status: Skilled Nursing Facility | Payer: Self-pay | Source: Home / Self Care | Attending: Internal Medicine

## 2024-05-01 ENCOUNTER — Encounter (HOSPITAL_COMMUNITY): Payer: Self-pay | Admitting: Internal Medicine

## 2024-05-01 ENCOUNTER — Inpatient Hospital Stay (HOSPITAL_COMMUNITY): Payer: Self-pay | Admitting: Anesthesiology

## 2024-05-01 DIAGNOSIS — J449 Chronic obstructive pulmonary disease, unspecified: Secondary | ICD-10-CM | POA: Diagnosis not present

## 2024-05-01 DIAGNOSIS — I6521 Occlusion and stenosis of right carotid artery: Secondary | ICD-10-CM | POA: Diagnosis present

## 2024-05-01 DIAGNOSIS — I5042 Chronic combined systolic (congestive) and diastolic (congestive) heart failure: Secondary | ICD-10-CM | POA: Diagnosis present

## 2024-05-01 DIAGNOSIS — Z992 Dependence on renal dialysis: Secondary | ICD-10-CM | POA: Diagnosis not present

## 2024-05-01 DIAGNOSIS — Z95828 Presence of other vascular implants and grafts: Secondary | ICD-10-CM | POA: Diagnosis not present

## 2024-05-01 DIAGNOSIS — N2581 Secondary hyperparathyroidism of renal origin: Secondary | ICD-10-CM | POA: Diagnosis present

## 2024-05-01 DIAGNOSIS — D352 Benign neoplasm of pituitary gland: Secondary | ICD-10-CM | POA: Diagnosis present

## 2024-05-01 DIAGNOSIS — E119 Type 2 diabetes mellitus without complications: Secondary | ICD-10-CM | POA: Diagnosis not present

## 2024-05-01 DIAGNOSIS — R4781 Slurred speech: Secondary | ICD-10-CM | POA: Diagnosis present

## 2024-05-01 DIAGNOSIS — G459 Transient cerebral ischemic attack, unspecified: Secondary | ICD-10-CM | POA: Diagnosis present

## 2024-05-01 DIAGNOSIS — D631 Anemia in chronic kidney disease: Secondary | ICD-10-CM | POA: Diagnosis present

## 2024-05-01 DIAGNOSIS — Z8616 Personal history of COVID-19: Secondary | ICD-10-CM | POA: Diagnosis not present

## 2024-05-01 DIAGNOSIS — Z9889 Other specified postprocedural states: Secondary | ICD-10-CM

## 2024-05-01 DIAGNOSIS — T80818A Extravasation of other vesicant agent, initial encounter: Secondary | ICD-10-CM | POA: Diagnosis present

## 2024-05-01 DIAGNOSIS — I2510 Atherosclerotic heart disease of native coronary artery without angina pectoris: Secondary | ICD-10-CM

## 2024-05-01 DIAGNOSIS — E785 Hyperlipidemia, unspecified: Secondary | ICD-10-CM | POA: Diagnosis present

## 2024-05-01 DIAGNOSIS — I6523 Occlusion and stenosis of bilateral carotid arteries: Secondary | ICD-10-CM | POA: Diagnosis not present

## 2024-05-01 DIAGNOSIS — F32A Depression, unspecified: Secondary | ICD-10-CM | POA: Diagnosis present

## 2024-05-01 DIAGNOSIS — I132 Hypertensive heart and chronic kidney disease with heart failure and with stage 5 chronic kidney disease, or end stage renal disease: Secondary | ICD-10-CM | POA: Diagnosis present

## 2024-05-01 DIAGNOSIS — G8194 Hemiplegia, unspecified affecting left nondominant side: Secondary | ICD-10-CM | POA: Diagnosis present

## 2024-05-01 DIAGNOSIS — Y842 Radiological procedure and radiotherapy as the cause of abnormal reaction of the patient, or of later complication, without mention of misadventure at the time of the procedure: Secondary | ICD-10-CM | POA: Diagnosis present

## 2024-05-01 DIAGNOSIS — N186 End stage renal disease: Secondary | ICD-10-CM | POA: Diagnosis present

## 2024-05-01 DIAGNOSIS — E1169 Type 2 diabetes mellitus with other specified complication: Secondary | ICD-10-CM | POA: Diagnosis present

## 2024-05-01 DIAGNOSIS — J4489 Other specified chronic obstructive pulmonary disease: Secondary | ICD-10-CM | POA: Diagnosis present

## 2024-05-01 DIAGNOSIS — Z794 Long term (current) use of insulin: Secondary | ICD-10-CM | POA: Diagnosis not present

## 2024-05-01 DIAGNOSIS — M79602 Pain in left arm: Secondary | ICD-10-CM

## 2024-05-01 DIAGNOSIS — E1122 Type 2 diabetes mellitus with diabetic chronic kidney disease: Secondary | ICD-10-CM | POA: Diagnosis present

## 2024-05-01 DIAGNOSIS — E1165 Type 2 diabetes mellitus with hyperglycemia: Secondary | ICD-10-CM | POA: Diagnosis present

## 2024-05-01 DIAGNOSIS — E1142 Type 2 diabetes mellitus with diabetic polyneuropathy: Secondary | ICD-10-CM | POA: Diagnosis present

## 2024-05-01 DIAGNOSIS — S52135A Nondisplaced fracture of neck of left radius, initial encounter for closed fracture: Secondary | ICD-10-CM | POA: Diagnosis present

## 2024-05-01 DIAGNOSIS — I671 Cerebral aneurysm, nonruptured: Secondary | ICD-10-CM | POA: Diagnosis present

## 2024-05-01 DIAGNOSIS — J439 Emphysema, unspecified: Secondary | ICD-10-CM | POA: Diagnosis present

## 2024-05-01 HISTORY — PX: RADIOLOGY WITH ANESTHESIA: SHX6223

## 2024-05-01 HISTORY — PX: IR ANGIO INTRA EXTRACRAN SEL COM CAROTID INNOMINATE UNI R MOD SED: IMG5359

## 2024-05-01 LAB — GLUCOSE, CAPILLARY
Glucose-Capillary: 193 mg/dL — ABNORMAL HIGH (ref 70–99)
Glucose-Capillary: 134 mg/dL — ABNORMAL HIGH (ref 70–99)
Glucose-Capillary: 95 mg/dL (ref 70–99)
Glucose-Capillary: 124 mg/dL — ABNORMAL HIGH (ref 70–99)
Glucose-Capillary: 100 mg/dL — ABNORMAL HIGH (ref 70–99)
Glucose-Capillary: 76 mg/dL (ref 70–99)

## 2024-05-01 LAB — CBC
HCT: 34.4 % — ABNORMAL LOW (ref 36.0–46.0)
Hemoglobin: 10.9 g/dL — ABNORMAL LOW (ref 12.0–15.0)
MCH: 29.1 pg (ref 26.0–34.0)
MCHC: 31.7 g/dL (ref 30.0–36.0)
MCV: 92 fL (ref 80.0–100.0)
Platelets: 317 10*3/uL (ref 150–400)
RBC: 3.74 MIL/uL — ABNORMAL LOW (ref 3.87–5.11)
RDW: 14.2 % (ref 11.5–15.5)
WBC: 11.6 10*3/uL — ABNORMAL HIGH (ref 4.0–10.5)
nRBC: 0 % (ref 0.0–0.2)

## 2024-05-01 LAB — BASIC METABOLIC PANEL WITH GFR
Anion gap: 17 — ABNORMAL HIGH (ref 5–15)
BUN: 37 mg/dL — ABNORMAL HIGH (ref 8–23)
CO2: 23 mmol/L (ref 22–32)
Calcium: 9.1 mg/dL (ref 8.9–10.3)
Chloride: 93 mmol/L — ABNORMAL LOW (ref 98–111)
Creatinine, Ser: 6.61 mg/dL — ABNORMAL HIGH (ref 0.44–1.00)
GFR, Estimated: 7 mL/min — ABNORMAL LOW (ref >60)
Glucose, Bld: 174 mg/dL — ABNORMAL HIGH (ref 70–99)
Potassium: 4.4 mmol/L (ref 3.5–5.1)
Sodium: 133 mmol/L — ABNORMAL LOW (ref 135–145)

## 2024-05-01 LAB — TYPE AND SCREEN
ABO/RH(D): AB POS
Antibody Screen: NEGATIVE

## 2024-05-01 LAB — MRSA NEXT GEN BY PCR, NASAL: MRSA by PCR Next Gen: NOT DETECTED

## 2024-05-01 SURGERY — RADIOLOGY WITH ANESTHESIA
Anesthesia: General

## 2024-05-01 MED ORDER — LIDOCAINE-PRILOCAINE 2.5-2.5 % EX CREA: 1.0000 | TOPICAL_CREAM | CUTANEOUS | Status: DC | PRN

## 2024-05-01 MED ORDER — IOHEXOL 300 MG/ML  SOLN
150.0000 mL | Freq: Once | INTRAMUSCULAR | Status: AC | PRN
Start: 2024-05-01 — End: 2024-05-01
  Administered 2024-05-01: 20 mL via INTRA_ARTERIAL

## 2024-05-01 MED ORDER — PENTAFLUOROPROP-TETRAFLUOROETH EX AERO: 1.0000 | INHALATION_SPRAY | CUTANEOUS | Status: DC | PRN

## 2024-05-01 MED ORDER — FENTANYL CITRATE (PF) 100 MCG/2ML IJ SOLN: 25.0000 ug | INTRAMUSCULAR | Status: DC | PRN

## 2024-05-01 MED ORDER — ONDANSETRON HCL 4 MG/2ML IJ SOLN
INTRAMUSCULAR | Status: DC | PRN
Start: 2024-05-01 — End: 2024-05-01
  Administered 2024-05-01: 4 mg via INTRAVENOUS

## 2024-05-01 MED ORDER — CLEVIDIPINE BUTYRATE 0.5 MG/ML IV EMUL
INTRAVENOUS | Status: AC
  Filled 2024-05-01: qty 100

## 2024-05-01 MED ORDER — LIDOCAINE 2% (20 MG/ML) 5 ML SYRINGE
INTRAMUSCULAR | Status: DC | PRN
Start: 2024-05-01 — End: 2024-05-01
  Administered 2024-05-01: 100 mg via INTRAVENOUS

## 2024-05-01 MED ORDER — TICAGRELOR 60 MG PO TABS
ORAL_TABLET | ORAL | Status: AC | PRN
  Administered 2024-05-01: 90 mg

## 2024-05-01 MED ORDER — ALTEPLASE 2 MG IJ SOLR: 2.0000 mg | Freq: Once | INTRAMUSCULAR | Status: DC | PRN

## 2024-05-01 MED ORDER — PHENYLEPHRINE HCL-NACL 20-0.9 MG/250ML-% IV SOLN
INTRAVENOUS | Status: DC | PRN
  Administered 2024-05-01: 20 ug/min via INTRAVENOUS

## 2024-05-01 MED ORDER — ACETAMINOPHEN 650 MG RE SUPP: 650.0000 mg | RECTAL | Status: DC | PRN

## 2024-05-01 MED ORDER — CHLORHEXIDINE GLUCONATE 0.12 % MT SOLN
OROMUCOSAL | Status: AC
  Administered 2024-05-01: 15 mL via OROMUCOSAL
  Filled 2024-05-01: qty 15

## 2024-05-01 MED ORDER — TICAGRELOR 90 MG PO TABS
90.0000 mg | ORAL_TABLET | Freq: Two times a day (BID) | ORAL | Status: DC
Start: 2024-05-01 — End: 2024-05-03
  Administered 2024-05-01 – 2024-05-03 (×4): 90 mg via ORAL
  Filled 2024-05-01 (×4): qty 1

## 2024-05-01 MED ORDER — ORAL CARE MOUTH RINSE: 15.0000 mL | OROMUCOSAL | Status: DC | PRN

## 2024-05-01 MED ORDER — ANTICOAGULANT SODIUM CITRATE 4% (200MG/5ML) IV SOLN: 5.0000 mL | Status: DC | PRN

## 2024-05-01 MED ORDER — SUGAMMADEX SODIUM 200 MG/2ML IV SOLN
INTRAVENOUS | Status: DC | PRN
  Administered 2024-05-01: 400 mg via INTRAVENOUS

## 2024-05-01 MED ORDER — ONDANSETRON HCL 4 MG/2ML IJ SOLN: 4.0000 mg | Freq: Four times a day (QID) | INTRAMUSCULAR | Status: DC | PRN

## 2024-05-01 MED ORDER — TICAGRELOR 90 MG PO TABS
ORAL_TABLET | ORAL | Status: AC
  Filled 2024-05-01: qty 1

## 2024-05-01 MED ORDER — ROCURONIUM BROMIDE 10 MG/ML (PF) SYRINGE
PREFILLED_SYRINGE | INTRAVENOUS | Status: DC | PRN
  Administered 2024-05-01: 60 mg via INTRAVENOUS

## 2024-05-01 MED ORDER — FENTANYL CITRATE (PF) 250 MCG/5ML IJ SOLN
INTRAMUSCULAR | Status: DC | PRN
  Administered 2024-05-01: 50 ug via INTRAVENOUS

## 2024-05-01 MED ORDER — FENTANYL CITRATE (PF) 100 MCG/2ML IJ SOLN
INTRAMUSCULAR | Status: AC
  Filled 2024-05-01: qty 2

## 2024-05-01 MED ORDER — ASPIRIN 81 MG PO CHEW: 81.0000 mg | CHEWABLE_TABLET | Freq: Every day | ORAL | Status: DC

## 2024-05-01 MED ORDER — PROTAMINE SULFATE 10 MG/ML IV SOLN
INTRAVENOUS | Status: DC | PRN
  Administered 2024-05-01: 5 mg via INTRAVENOUS

## 2024-05-01 MED ORDER — ASPIRIN 81 MG PO CHEW
81.0000 mg | CHEWABLE_TABLET | Freq: Every day | ORAL | Status: DC
  Administered 2024-05-02 – 2024-05-03 (×2): 81 mg via ORAL
  Filled 2024-05-01 (×2): qty 1

## 2024-05-01 MED ORDER — ACETAMINOPHEN 160 MG/5ML PO SOLN: 650.0000 mg | ORAL | Status: DC | PRN

## 2024-05-01 MED ORDER — PROPOFOL 10 MG/ML IV BOLUS
INTRAVENOUS | Status: DC | PRN
  Administered 2024-05-01: 130 mg via INTRAVENOUS

## 2024-05-01 MED ORDER — TICAGRELOR 90 MG PO TABS
90.0000 mg | ORAL_TABLET | Freq: Two times a day (BID) | ORAL | Status: DC
  Filled 2024-05-01: qty 1

## 2024-05-01 MED ORDER — CEFAZOLIN SODIUM-DEXTROSE 2-4 GM/100ML-% IV SOLN
INTRAVENOUS | Status: AC
  Filled 2024-05-01: qty 100

## 2024-05-01 MED ORDER — LIDOCAINE HCL (PF) 1 % IJ SOLN: 5.0000 mL | INTRAMUSCULAR | Status: DC | PRN

## 2024-05-01 MED ORDER — RENA-VITE PO TABS
1.0000 | ORAL_TABLET | Freq: Every day | ORAL | Status: DC
  Administered 2024-05-01 – 2024-05-03 (×3): 1 via ORAL
  Filled 2024-05-01 (×3): qty 1

## 2024-05-01 MED ORDER — SODIUM CHLORIDE 0.9 % IV SOLN: INTRAVENOUS | Status: DC

## 2024-05-01 MED ORDER — CHLORHEXIDINE GLUCONATE 0.12 % MT SOLN: 15.0000 mL | Freq: Once | OROMUCOSAL | Status: AC

## 2024-05-01 MED ORDER — ACETAMINOPHEN 325 MG PO TABS
650.0000 mg | ORAL_TABLET | ORAL | Status: DC | PRN
  Administered 2024-05-01 – 2024-05-02 (×3): 650 mg via ORAL
  Filled 2024-05-01 (×3): qty 2

## 2024-05-01 MED ORDER — ONDANSETRON HCL 4 MG/2ML IJ SOLN
4.0000 mg | Freq: Once | INTRAMUSCULAR | Status: AC
  Administered 2024-05-01: 4 mg via INTRAVENOUS

## 2024-05-01 MED ORDER — CLEVIDIPINE BUTYRATE 0.5 MG/ML IV EMUL
INTRAVENOUS | Status: DC | PRN
  Administered 2024-05-01: 2 mg/h via INTRAVENOUS

## 2024-05-01 MED ORDER — NITROGLYCERIN 1 MG/10 ML FOR IR/CATH LAB
INTRA_ARTERIAL | Status: AC
Start: 2024-05-01 — End: ?
  Filled 2024-05-01: qty 10

## 2024-05-01 MED ORDER — HEPARIN SODIUM (PORCINE) 1000 UNIT/ML DIALYSIS: 2000.0000 [IU] | INTRAMUSCULAR | Status: DC | PRN

## 2024-05-01 MED ORDER — ACETAMINOPHEN 500 MG PO TABS: 1000.0000 mg | ORAL_TABLET | Freq: Once | ORAL | Status: DC

## 2024-05-01 MED ORDER — CLEVIDIPINE BUTYRATE 0.5 MG/ML IV EMUL
0.0000 mg/h | INTRAVENOUS | Status: DC
  Administered 2024-05-01: 4 mg/h via INTRAVENOUS
  Administered 2024-05-01: 2 mg/h via INTRAVENOUS
  Administered 2024-05-02: 4 mg/h via INTRAVENOUS
  Filled 2024-05-01 (×3): qty 50

## 2024-05-01 MED ORDER — OXYCODONE HCL 5 MG PO TABS: 5.0000 mg | ORAL_TABLET | Freq: Once | ORAL | Status: DC | PRN

## 2024-05-01 MED ORDER — TRIMETHOBENZAMIDE HCL 100 MG/ML IM SOLN
200.0000 mg | Freq: Once | INTRAMUSCULAR | Status: DC | PRN
  Filled 2024-05-01 (×2): qty 2

## 2024-05-01 MED ORDER — NEPRO/CARBSTEADY PO LIQD: 237.0000 mL | ORAL | Status: DC | PRN

## 2024-05-01 MED ORDER — CEFAZOLIN SODIUM-DEXTROSE 2-3 GM-%(50ML) IV SOLR
INTRAVENOUS | Status: DC | PRN
Start: 2024-05-01 — End: 2024-05-01
  Administered 2024-05-01: 2 g via INTRAVENOUS

## 2024-05-01 MED ORDER — ORAL CARE MOUTH RINSE: 15.0000 mL | Freq: Once | OROMUCOSAL | Status: AC

## 2024-05-01 MED ORDER — OXYCODONE HCL 5 MG/5ML PO SOLN: 5.0000 mg | Freq: Once | ORAL | Status: DC | PRN

## 2024-05-01 MED ORDER — ONDANSETRON HCL 4 MG/2ML IJ SOLN
INTRAMUSCULAR | Status: AC
  Administered 2024-05-01: 4 mg via INTRAVENOUS
  Filled 2024-05-01: qty 2

## 2024-05-01 MED ORDER — HEPARIN SODIUM (PORCINE) 1000 UNIT/ML DIALYSIS: 2000.0000 [IU] | Freq: Once | INTRAMUSCULAR | Status: DC

## 2024-05-01 MED ORDER — HEPARIN SODIUM (PORCINE) 1000 UNIT/ML DIALYSIS: 1000.0000 [IU] | INTRAMUSCULAR | Status: DC | PRN

## 2024-05-01 MED ORDER — DROPERIDOL 2.5 MG/ML IJ SOLN: 0.6250 mg | Freq: Once | INTRAMUSCULAR | Status: DC | PRN

## 2024-05-01 MED ORDER — SODIUM CHLORIDE 0.9 % IV SOLN
INTRAVENOUS | Status: DC | PRN
Start: 2024-05-01 — End: 2024-05-01

## 2024-05-01 NOTE — Transfer of Care (Signed)
 Immediate Anesthesia Transfer of Care Note  Patient: Terry Arroyo  Procedure(s) Performed: RADIOLOGY WITH ANESTHESIA  Patient Location: PACU  Anesthesia Type:General  Level of Consciousness: awake  Airway & Oxygen Therapy: Patient Spontanous Breathing and Patient connected to nasal cannula oxygen  Post-op Assessment: Report given to RN and Post -op Vital signs reviewed and stable  Post vital signs: Reviewed and stable  Last Vitals:  Vitals Value Taken Time  BP 129/49 05/01/24 1320  Temp    Pulse 66 05/01/24 1329  Resp 17 05/01/24 1329  SpO2 100 % 05/01/24 1329  Vitals shown include unfiled device data.  Last Pain:  Vitals:   05/01/24 1046  TempSrc:   PainSc: 8       Patients Stated Pain Goal: 0 (05/01/24 0000)  Complications: No notable events documented.

## 2024-05-01 NOTE — Progress Notes (Signed)
 Received called from IR PA for changes in pt's mental status. Upon assessment, pt get confused, unable to follow the command. Code stroke activated. Pt brought down to Stat CT and MRI.   Oral Billings, RN

## 2024-05-01 NOTE — Sedation Documentation (Signed)
 ACT

## 2024-05-01 NOTE — Progress Notes (Signed)
 Chief Complaint: Patient was seen today for right internal carotid artery stenosis.   Supervising Physician: Luellen Sages  Patient Status: [facility] - In-pt  Subjective: Patient is post procedure. Patient is laying in bed. She reports improvement in elbow pain, but has some back pain.   Objective: Physical Exam: BP (!) 141/67   Pulse 65   Temp 98 F (36.7 C)   Resp 16   Ht (P) 5\' 6"  (1.676 m)   Wt (P) 164 lb 3.9 oz (74.5 kg)   SpO2 91%   BMI (P) 26.51 kg/m  Alert, awake, and oriented Speech and comprehension intact. No facial asymmetry. Tongue midline. Able to move extremities x 4.  Dorsiflexion and plantarflexion strength 5/5.  Common femoral artery puncture site right groin: without evidence of bleeding, no hematoma, no pseudoaneurysm  No TTP over left side of lower back.    Current Facility-Administered Medications:    0.9 %  sodium chloride  infusion, , Intravenous, Continuous, Deveshwar, Sanjeev, MD   acetaminophen  (TYLENOL ) tablet 650 mg, 650 mg, Oral, Q4H PRN **OR** acetaminophen  (TYLENOL ) 160 MG/5ML solution 650 mg, 650 mg, Per Tube, Q4H PRN **OR** acetaminophen  (TYLENOL ) suppository 650 mg, 650 mg, Rectal, Q4H PRN, Deveshwar, Sanjeev, MD   albuterol  (PROVENTIL ) (2.5 MG/3ML) 0.083% nebulizer solution 3 mL, 3 mL, Inhalation, Q6H PRN, Melvin, Alexander B, MD   alteplase  (CATHFLO ACTIVASE ) injection 2 mg, 2 mg, Intracatheter, Once PRN, Chucky Craver, MD   anticoagulant sodium citrate  solution 5 mL, 5 mL, Intracatheter, PRN, Chucky Craver, MD   [START ON 05/02/2024] aspirin  chewable tablet 81 mg, 81 mg, Oral, Daily **OR** [START ON 05/02/2024] aspirin  chewable tablet 81 mg, 81 mg, Per Tube, Daily, Deveshwar, Sanjeev, MD   atorvastatin  (LIPITOR ) tablet 80 mg, 80 mg, Oral, q1800, Melvin, Alexander B, MD, 80 mg at 04/30/24 1729   Chlorhexidine  Gluconate Cloth 2 % PADS 6 each, 6 each, Topical, Q0600, Chucky Craver, MD, 6 each at 05/01/24 4166   clevidipine   (CLEVIPREX ) infusion 0.5 mg/mL, 0-21 mg/hr, Intravenous, Continuous, Deveshwar, Sanjeev, MD, Last Rate: 4 mL/hr at 05/01/24 1343, 2 mg/hr at 05/01/24 1343   feeding supplement (NEPRO CARB STEADY) liquid 237 mL, 237 mL, Oral, PRN, Chucky Craver, MD   heparin  injection 1,000 Units, 1,000 Units, Intracatheter, PRN, Chucky Craver, MD   [START ON 05/02/2024] heparin  injection 2,000 Units, 2,000 Units, Dialysis, Once in dialysis, Chucky Craver, MD   [START ON 05/02/2024] heparin  injection 2,000 Units, 2,000 Units, Dialysis, PRN, Chucky Craver, MD   heparin  injection 5,000 Units, 5,000 Units, Subcutaneous, Q8H, Melvin, Alexander B, MD, 5,000 Units at 05/01/24 0630   HYDROmorphone  (DILAUDID ) injection 0.5 mg, 0.5 mg, Intravenous, Q4H PRN, Kc, Ramesh, MD, 0.5 mg at 05/01/24 1432   insulin  aspart (novoLOG ) injection 0-15 Units, 0-15 Units, Subcutaneous, TID WC, Melvin, Alexander B, MD, 3 Units at 05/01/24 1601   iohexol  (OMNIPAQUE ) 350 MG/ML injection 100 mL, 100 mL, Intravenous, Once PRN, Melvin, Alexander B, MD   lidocaine  (PF) (XYLOCAINE ) 1 % injection 5 mL, 5 mL, Intradermal, PRN, Chucky Craver, MD   lidocaine -prilocaine  (EMLA ) cream 1 Application, 1 Application, Topical, PRN, Chucky Craver, MD   mirtazapine  (REMERON ) tablet 30 mg, 30 mg, Oral, QHS, Melvin, Alexander B, MD, 30 mg at 04/30/24 2219   Oral care mouth rinse, 15 mL, Mouth Rinse, PRN, Deveshwar, Idell Majestic, MD   pentafluoroprop-tetrafluoroeth (GEBAUERS) aerosol 1 Application, 1 Application, Topical, PRN, Chucky Craver, MD   senna-docusate (Senokot-S) tablet 1 tablet, 1 tablet, Oral, QHS PRN, Melvin, Alexander B, MD  ticagrelor  (BRILINTA ) tablet 90 mg, 90 mg, Oral, BID, Melvin, Alexander B, MD, 90 mg at 04/30/24 2219   ticagrelor  (BRILINTA ) tablet 90 mg, 90 mg, Oral, BID **OR** ticagrelor  (BRILINTA ) tablet 90 mg, 90 mg, Per Tube, BID, Deveshwar, Sanjeev, MD   traZODone  (DESYREL ) tablet 25 mg, 25 mg, Oral, QHS, Melvin, Alexander B, MD,  25 mg at 04/30/24 2219   trimethobenzamide (TIGAN) injection 200 mg, 200 mg, Intramuscular, Once PRN, Juliette Oh, MD  Labs: CBC Recent Labs    04/30/24 0700 05/01/24 0612  WBC 9.4 11.6*  HGB 10.1* 10.9*  HCT 31.3* 34.4*  PLT 278 317   BMET Recent Labs    04/30/24 0700 05/01/24 0612  NA 133* 133*  K 4.9 4.4  CL 94* 93*  CO2 23 23  GLUCOSE 205* 174*  BUN 67* 37*  CREATININE 9.54* 6.61*  CALCIUM  8.6* 9.1   LFT Recent Labs    04/30/24 0700  PROT 7.1  ALBUMIN  3.3*  AST 11*  ALT 10  ALKPHOS 79  BILITOT 0.4   PT/INR Recent Labs    04/29/24 1255 04/30/24 2250  LABPROT 14.7 14.1  INR 1.1 1.1     Studies/Results: VAS US  UPPER EXTREMITY VENOUS DUPLEX Result Date: 05/01/2024 UPPER VENOUS STUDY  Patient Name:  Terry Arroyo  Date of Exam:   05/01/2024 Medical Rec #: [id number]        Accession #:    [id number] Date of Birth: [birth year]         Patient Gender: F Patient Age:   63 years Exam Location:  [facility] Procedure:      VAS US  UPPER EXTREMITY VENOUS DUPLEX Referring Phys: RAMESH KC --------------------------------------------------------------------------------  Indications: Pain Risk Factors: None identified. Comparison Study: No prior studies. Performing Technologist: Lerry Ransom RVT  Examination Guidelines: A complete evaluation includes B-mode imaging, spectral Doppler, color Doppler, and power Doppler as needed of all accessible portions of each vessel. Bilateral testing is considered an integral part of a complete examination. Limited examinations for reoccurring indications may be performed as noted.  Right Findings: +----------+------------+---------+-----------+----------+-------+ RIGHT     CompressiblePhasicitySpontaneousPropertiesSummary +----------+------------+---------+-----------+----------+-------+ Subclavian    Full       Yes       Yes                      +----------+------------+---------+-----------+----------+-------+   Left Findings: +----------+------------+---------+-----------+----------+-------+ LEFT      CompressiblePhasicitySpontaneousPropertiesSummary +----------+------------+---------+-----------+----------+-------+ IJV           Full       Yes       Yes                      +----------+------------+---------+-----------+----------+-------+ Subclavian               Yes       Yes                      +----------+------------+---------+-----------+----------+-------+ Axillary      Full       Yes       Yes                      +----------+------------+---------+-----------+----------+-------+ Brachial      Full                                          +----------+------------+---------+-----------+----------+-------+  Radial        Full                                          +----------+------------+---------+-----------+----------+-------+ Ulnar         Full                                          +----------+------------+---------+-----------+----------+-------+ Cephalic      Full                                          +----------+------------+---------+-----------+----------+-------+ Basilic       Full                                          +----------+------------+---------+-----------+----------+-------+  Summary:  Right: No evidence of thrombosis in the subclavian.  Left: No evidence of deep vein thrombosis in the upper extremity. No evidence of superficial vein thrombosis in the upper extremity.  *See table(s) above for measurements and observations.    Preliminary    MR BRAIN WO CONTRAST Result Date: 05/01/2024 CLINICAL DATA:  63 year old female code stroke presentation this morning. Dialysis patient. Severe atherosclerosis in the head and neck on CTA this month. EXAM: MRI HEAD WITHOUT CONTRAST LIMITED TECHNIQUE: Multiplanar, multiecho pulse sequences of the brain and surrounding structures were obtained without intravenous contrast. COMPARISON:   Plain head CT 0847 hours. CT head, CTA, CTP 04/23/24. FINDINGS: Neurology requests a study limited to DWI, sagittal T1 and axial FLAIR imaging. No restricted diffusion to suggest acute infarction. No midline shift, mass effect, evidence of mass lesion, ventriculomegaly, extra-axial collection or acute intracranial hemorrhage. Cervicomedullary junction and pituitary are within normal limits. Axial FLAIR imaging demonstrates scattered, generally small hyperintense foci in the bilateral cerebral white matter. The extent is mild to moderate for age. No cortical encephalomalacia identified. Incidental midline nasopharyngeal retention cysts (Tornwaldt cyst, normal variant). Negative for age visible cervical spine. IMPRESSION: Negative for acute infarct. No evidence of acute intracranial abnormality on limited exam. Electronically Signed   By: Marlise Simpers M.D.   On: 05/01/2024 10:26   CT HEAD CODE STROKE WO CONTRAST Result Date: 05/01/2024 CLINICAL DATA:  Code stroke. Neuro deficit, concern for stroke, left arm weakness. EXAM: CT HEAD WITHOUT CONTRAST TECHNIQUE: Contiguous axial images were obtained from the base of the skull through the vertex without intravenous contrast. RADIATION DOSE REDUCTION: This exam was performed according to the departmental dose-optimization program which includes automated exposure control, adjustment of the mA and/or kV according to patient size and/or use of iterative reconstruction technique. COMPARISON:  CT head and MRI head 04/29/2024. FINDINGS: Brain: No acute intracranial hemorrhage. No CT evidence of acute infarct. No edema, mass effect, or midline shift. The basilar cisterns are patent. Ventricles: The ventricles are normal. Vascular: Atherosclerotic calcifications of the carotid siphons and intracranial vertebral arteries. No hyperdense vessel. Skull: No acute or aggressive finding. Orbits: Right lens replacement.  Orbits otherwise unremarkable. Sinuses: Mild mucosal thickening in the  left posterior ethmoid air cells. Postsurgical changes of the sphenoid sinuses. Other: Trace fluid in the right  mastoid air cells. ASPECTS [facility] Stroke Program Early CT Score) - Ganglionic level infarction (caudate, lentiform nuclei, internal capsule, insula, M1-M3 cortex): 7 - Supraganglionic infarction (M4-M6 cortex): 3 Total score (0-10 with 10 being normal): 10 IMPRESSION: 1. No CT evidence of acute intracranial abnormality. 2. ASPECTS is 10 These results were communicated to Dr. Christiane Cowing at 9:06 am on 05/01/2024 by text page via the [facility] messaging system. Electronically Signed   By: Denny Flack M.D.   On: 05/01/2024 09:07   US  LT UPPER EXTREM LTD SOFT TISSUE NON VASCULAR Result Date: 04/30/2024 CLINICAL DATA:  Elbow pain and swelling.  Joint effusion. EXAM: ULTRASOUND LEFT UPPER EXTREMITY LIMITED TECHNIQUE: Ultrasound examination of the upper extremity soft tissues was performed in the area of clinical concern. COMPARISON:  Elbow radiographs 04/29/2024 and 04/30/2024. CT 04/30/2024. FINDINGS: Examination was targeted to the posterior aspect of the left elbow. There is generalized subcutaneous edema, as seen on CT. A focal fluid collection posteriorly measures approximately 3.6 x 1.4 x 2.4 cm. This has a deep location adjacent to bone, and as correlated with same day CT likely represents the known elbow joint effusion. No other focal periarticular fluid collections are identified. IMPRESSION: Focal fluid collection posterior to the left elbow likely represents the known elbow joint effusion. No other focal periarticular fluid collections identified. If there is concern of septic arthritis, consider further evaluation with MRI with and without contrast or arthrocentesis. Electronically Signed   By: Elmon Hagedorn M.D.   On: 04/30/2024 21:44   CT ELBOW LEFT WO CONTRAST Result Date: 04/30/2024 CLINICAL DATA:  Bone lesion, elbow, incidental Elbow pain and swelling with joint effusion on radiographs. EXAM: CT  OF THE UPPER LEFT EXTREMITY WITHOUT CONTRAST TECHNIQUE: Multidetector CT imaging of the left elbow was performed according to the standard protocol. RADIATION DOSE REDUCTION: This exam was performed according to the departmental dose-optimization program which includes automated exposure control, adjustment of the mA and/or kV according to patient size and/or use of iterative reconstruction technique. COMPARISON:  Elbow radiographs 04/29/2024 and 04/30/2024. Ultrasound 04/30/2024. FINDINGS: Bones/Joint/Cartilage No evidence of acute fracture, dislocation or bone destruction. Mild spurring of the coronoid process. There is a moderate-sized elbow joint effusion without evidence of erosive disease or intra-articular air. Ligaments Suboptimally assessed by CT. Muscles and Tendons No intramuscular fluid collection or focal atrophy identified on noncontrast imaging. As evaluated by CT, the biceps and triceps tendons appear intact. Soft tissues Dorsal subcutaneous edema within the distal upper arm, extending posterior to the elbow and into the dorsal aspect of the proximal forearm. There may be a small amount of fluid within the olecranon bursa. Small amount of air in the antecubital fossa without associated focal fluid collection, likely related to a peripheral IV or reported recent contrast extravasation. IMPRESSION: 1. Moderate-sized elbow joint effusion without evidence of erosive disease or intra-articular air. This is nonspecific and could be degenerative or inflammatory. If there is clinical concern for septic arthritis, consider arthrocentesis. 2. Dorsal subcutaneous edema within the distal upper arm, extending posterior to the elbow and into the dorsal aspect of the proximal forearm. No associated focal fluid collection. 3. Small amount of air in the antecubital fossa without associated focal fluid collection, likely related to a peripheral IV or reported recent contrast extravasation. 4. No acute osseous findings  or evidence of osteomyelitis. Electronically Signed   By: Elmon Hagedorn M.D.   On: 04/30/2024 21:34   DG Elbow 2 Views Left Result Date: 04/30/2024 CLINICAL DATA:  Pain and  swelling after an IV insertion. EXAM: LEFT ELBOW - 2 VIEW COMPARISON:  04/29/2024 FINDINGS: Soft tissue swelling over the olecranon. Mild subcutaneous edema in the posterior aspect of the distal upper arm. No acute osseous abnormality. IMPRESSION: 1. Edema over the olecranon may be related to extravasated contrast material or olecranon bursitis. 2. Subcutaneous edema in the dorsal aspect of the distal upper arm, compatible with recent contrast extravasation. Electronically Signed   By: Shearon Denis M.D.   On: 04/30/2024 19:27   VAS US  CAROTID Result Date: 04/30/2024 Carotid Arterial Duplex Study Patient Name:  Terry Arroyo  Date of Exam:   04/30/2024 Medical Rec #: [id number]        Accession #:    [id number] Date of Birth: 1961-04-26         Patient Gender: F Patient Age:   71 years Exam Location:  [facility] Procedure:      VAS US  CAROTID Referring Phys: Authur Leghorn MELVIN --------------------------------------------------------------------------------  Indications:       CVA. Risk Factors:      Hypertension, hyperlipidemia, Diabetes. Other Factors:     Left ICA stent. Comparison Study:  04/25/2024 - Right Carotid: Velocities in the right ICA are                    consistent with an upper range 1-39% stenosis.                     Left Carotid: Left ICA stent is patent with elevated                    velocities suggestive                    of 50-74% stenosis.                     Vertebrals: Bilateral vertebral arteries demonstrate                    antegrade flow.                    Subclavians: Right subclavian artery was stenotic. Normal                    flow                    hemodynamics were seen in the left subclavian artery. Performing Technologist: Lerry Ransom RVT  Examination Guidelines: A complete evaluation  includes B-mode imaging, spectral Doppler, color Doppler, and power Doppler as needed of all accessible portions of each vessel. Bilateral testing is considered an integral part of a complete examination. Limited examinations for reoccurring indications may be performed as noted.  Right Carotid Findings: +----------+--------+--------+--------+--------------------------+--------+           PSV cm/sEDV cm/sStenosisPlaque Description        Comments +----------+--------+--------+--------+--------------------------+--------+ CCA Prox  123     13              smooth and heterogenous            +----------+--------+--------+--------+--------------------------+--------+ CCA Distal54      9               irregular and heterogenous         +----------+--------+--------+--------+--------------------------+--------+ ICA Prox  96      19              irregular  and heterogenous         +----------+--------+--------+--------+--------------------------+--------+ ICA Mid   157     44      40-59%  irregular and heterogenous         +----------+--------+--------+--------+--------------------------+--------+ ICA Distal107     26                                        tortuous +----------+--------+--------+--------+--------------------------+--------+ ECA       95      1                                                  +----------+--------+--------+--------+--------------------------+--------+ +----------+--------+-------+--------+-------------------+           PSV cm/sEDV cmsDescribeArm Pressure (mmHG) +----------+--------+-------+--------+-------------------+ [id number]     106                                +----------+--------+-------+--------+-------------------+ +---------+--------+--+--------+--+---------+ VertebralPSV cm/s43EDV cm/s10Antegrade +---------+--------+--+--------+--+---------+ Elevated velocities are noted in the subclavian artery. Left Carotid  Findings: +----------+--------+--------+--------+-----------------------+--------+           PSV cm/sEDV cm/sStenosisPlaque Description     Comments +----------+--------+--------+--------+-----------------------+--------+ CCA Prox  73      11              smooth and heterogenous         +----------+--------+--------+--------+-----------------------+--------+ CCA Distal66      12              smooth and heterogenous         +----------+--------+--------+--------+-----------------------+--------+ ICA Prox  76      17                                     Stent    +----------+--------+--------+--------+-----------------------+--------+ ICA Mid   112     31                                     Stent    +----------+--------+--------+--------+-----------------------+--------+ ICA Distal111     24                                     Stent    +----------+--------+--------+--------+-----------------------+--------+ ECA       63      10                                              +----------+--------+--------+--------+-----------------------+--------+ +----------+--------+--------+--------+-------------------+           PSV cm/sEDV cm/sDescribeArm Pressure (mmHG) +----------+--------+--------+--------+-------------------+ Subclavian131                                         +----------+--------+--------+--------+-------------------+ +---------+--------+--+--------+--+---------+ VertebralPSV cm/s66EDV cm/s17Antegrade +---------+--------+--+--------+--+---------+  Left Stent(s): +---------------+---+--++++ Prox to Stent  61 11 +---------------+---+--++++ Proximal Stent  76 17 +---------------+---+--++++ Mid Stent      11231 +---------------+---+--++++ Distal Stent   11124 +---------------+---+--++++ Distal to Stent54 20 +---------------+---+--++++    Summary: Right Carotid: Velocities in the right ICA are consistent with a  40-59%                stenosis. Left Carotid: Left ICA stent is patent with less than 50% stenosis by velocity. Vertebrals: Bilateral vertebral arteries demonstrate antegrade flow. *See table(s) above for measurements and observations.     Preliminary     Assessment/Plan: right internal carotid artery stenosis.  Patient underwent a right common carotid arteriogram with Dr. Alvira Josephs today 05/01/24. Patient is doing well post procedure. Vitals stable. Groin site unremarkable. No new neurological deficits.  Continue to monitor neurological status and groin site. Contact IR with questions/concerns.     LOS: 0 days   I spent a total of 15 minutes in face to face in clinical consultation, greater than 50% of which was counseling/coordinating care for right internal carotid artery stenosis.  Neomia Banner Chenel Wernli PA-C 05/01/2024 3:57 PM

## 2024-05-01 NOTE — Progress Notes (Signed)
 Left upper extremity venous duplex has been completed. Preliminary results can be found in CV Proc through chart review.   05/01/24 10:11 AM Birda Buffy RVT

## 2024-05-01 NOTE — Code Documentation (Addendum)
 Stroke Response Nurse Documentation Code Documentation  Terry Arroyo is a 63 y.o. female admitted to [facility]  on 04/29/2024 for left sided weakness and slurred speech with past medical hx of ACA CHF, COPD, CAD, depressin, HFrEF, hyperlipidemia, hypertension, myocardial infarction, gait instability, GERD, anxiety, blind left eye, CABGx4, sleep apnea, ESRD M/W/F, stroke with left ICA stent placement this month. On aspirin  81 mg daily and Brilinta  (ticagrelor ) 90 mg bid. Code stroke was activated by 3W staff.   Patient on 3W unit where she was LKW at [phone] when she received pain medication Dilaudid  0.5mg  for left arm pain. Provider found her vomiting around 0815 and noted confusion and limited speech.   Stroke team at the bedside after patient activation. Patient to CT with team. NIHSS 5, see documentation for details and code stroke times. Patient with disoriented, left arm weakness, left decreased sensation, and dysarthria  on exam. The following imaging was completed:  CT Head and MRI. Patient is not a candidate for IV Thrombolytic due to stroke not suspected. Patient is not a candidate for IR due to no LVO.   Care/Plan: continue care as ordered.   Process delays: difficult to determine clear LKW. Night RN called to clarify.    Felicitas Horse Stroke Response RN

## 2024-05-01 NOTE — Sedation Documentation (Signed)
 Per MD Christiane Cowing and Deveshwar, no intervention. See MD notes.

## 2024-05-01 NOTE — Procedures (Signed)
 INR   Status post right common carotid arteriogram.  Right CFA approach.  Findings.  1.  Approximately 45 to 50% stenosis of the right internal carotid artery proximally.  Approximately 50% stenosis of the intracranial right ICA cavernous segment.  No evidence of intraluminal filling defects, or irregularities noted in the proximal right internal carotid artery.  8 French Angio-Seal closure device used for hemostasis at the right groin puncture site.  Distal pulses all dopplerable unchanged.  Patient extubated.  Following simple commands.  Denies any headaches nausea vomiting.  Patient moves all fours nearly equally unchanged from prior to the angiogram.  Jory Ng MD.

## 2024-05-01 NOTE — Anesthesia Procedure Notes (Signed)
 Arterial Line Insertion Start/End5/13/2025 11:10 AM, 05/01/2024 11:14 AM Performed by: Vernadine Golas, MD, anesthesiologist  Patient location: Pre-op. Preanesthetic checklist: patient identified, IV checked, site marked, risks and benefits discussed, surgical consent, monitors and equipment checked, pre-op evaluation, timeout performed and anesthesia consent Lidocaine  1% used for infiltration Left, radial was placed Catheter size: 20 G Hand hygiene performed  and maximum sterile barriers used   Attempts: 1 Procedure performed using ultrasound guided technique. Ultrasound Notes:anatomy identified, needle tip was noted to be adjacent to the nerve/plexus identified, no ultrasound evidence of intravascular and/or intraneural injection and image(s) printed for medical record Following insertion, dressing applied and Biopatch. Post procedure assessment: normal and unchanged  Patient tolerated the procedure well with no immediate complications.

## 2024-05-01 NOTE — Anesthesia Procedure Notes (Signed)
 Procedure Name: Intubation Date/Time: 05/01/2024 12:03 PM  Performed by: Merna Aase, CRNAPre-anesthesia Checklist: Patient identified, Emergency Drugs available, Suction available and Patient being monitored Patient Re-evaluated:Patient Re-evaluated prior to induction Oxygen Delivery Method: Circle system utilized Preoxygenation: Pre-oxygenation with 100% oxygen Induction Type: IV induction Ventilation: Mask ventilation without difficulty Laryngoscope Size: Mac and 4 Grade View: Grade I Tube type: Oral Tube size: 7.0 mm Number of attempts: 1 Airway Equipment and Method: Stylet and Oral airway Placement Confirmation: ETT inserted through vocal cords under direct vision, positive ETCO2 and breath sounds checked- equal and bilateral Secured at: 21 cm Tube secured with: Tape Dental Injury: Teeth and Oropharynx as per pre-operative assessment

## 2024-05-01 NOTE — Progress Notes (Signed)
 PROGRESS NOTE Terry Arroyo  ZOX:[id number] DOB: 1961-04-06 DOA: 04/29/2024 PCP: Jimmy Moulding, MD  Brief Narrative/Hospital Course: [place].[age] y.o. female with medical history significant of diabetes, neuropathy, hypertension, hyperlipidemia, GERD, stroke, CAD status post CABG, anemia, chronic combined systolic and diastolic CHF, ESRD on HD, left eye blindness, brain aneurysm, benign pituitary neoplasm, DVT, depression, cataract, COPD, OSA presenting with slurred speech, left-sided weakness and some left arm pain.   Recent admitted 5/5-5/10 on neurology service for acute ischemic MCA  secondary to left ICA stenosis treated with stenting and DAPT.  MRI following intervention did not show infarct so the stroke was aborted by the intervention. In the ED :Vital signs in the ED notable for blood pressure in the 110s-160s systolic, heart rate in the 50s-60s.  Lab creatinine stable at 8.5, glucose 160, albumin  3.4.  CBC with hemoglobin stable at 10.2.  PT, PTT, INR within normal limits.  Ethanol level negative.  CT head>>no acute abnormality. MRI brain >>motion degradation but no acute abnormality. MRA of the head and neck>> showed similar carotid artery disease with greater than 70% stenosis on the right side and status post intervention on the left.  Also noted was stable aneurysm. Left elbow x-ray showed questionable radial neck fracture with moderate elbow effusion and possible evidence of extravasation of contrast at the soft tissue near the ulna. Patient received Dilaudid  in the ED.  Neurology was consulted and evaluated the patient-possible TIA/symptomatic right ICA stenosis.  With modified TIA workup recommended given recent workup for stroke  Subjective; Patient seen and examined in preop Overnight afebrile BP stable She is n.p.o.  This morning CT head and MRI brain done due to change in mental status  for IR PA-cultures also and vomiting and having left elbow pain  Labs showed creatinine 6.6,  imaging no new stroke In.  She is alert awake oriented complains of left elbow pain able to move all other extremities Neuro IR PA at bedside as well and feels she is better compared to this morning  Assessment and plan:  TIA Recent CVA and admission after a left ICA stenosis leading to MCA ischemia s/p stent Right carotid artery stenosis Cerebral aneurysm: Admitted after episode of tiredness, slurred speech, left arm weakness and left arm pain. Imaging findings unremarkable, neurology consulted recommended modified TIA workup  Imaging obtained, showing right internal carotid artery stenosis- Neuro IR and neurology on board 2D echo EF 35-40% G2 DD.  LDL 75 A1c 8.6. Continue aspirin  81, Brilinta , Lipitor  80 Plan for cerebral angiogram with intervention 5/13-repeat CT brain MRI brain no acute finding Cont neurocheck per protocol   Left elbow pain/effusion Effusion on x-ray with questionable radial neck fracture and possible extravasation of prior contrast material. Ortho input appreciated, CT elbow shows moderate size elbow joint effusion without evidence of erosive disease or intra-articular air-disease nonspecific and could be degenerative or inflammatory, dorsal subcutaneous edema in the distal upper arm extending posterior to the elbow small amount of urine in the antecubital fossa no acute osseous finding or evidence of osteomyelitis Continue sling, discussed with Ortho PA await further input?  If benefits of joint tap Continue pain control  ESRD on HD MWF Nephrology consulted for HD.  Last HD 5/12   Hypertension BP remains controlled.  Currently meds on hold.    Hyperlipidemia Continue atorvastatin  80 mg   CAD s/p CABG: On ASA, Brilinta  as above and atorvastatin    Anemia of chronic kidney disease Hemoglobin stable at 10 g baseline.  Mild tach  Recent Labs  Lab 04/26/24 0959 04/27/24 1758 04/29/24 1255 04/29/24 1302 04/30/24 0700  HGB 10.3* 10.8* 10.2* 10.5* 10.1*   HCT 31.3* 33.2* 33.2* 31.0* 31.3*    Chronic combined systolic and diastolic CHF Last echo was 4 days PTA EF 35-40%, G2 DD, mild reduced RV function.  MR, TR. Manage volume with HD.Holding carvedilol    DM A1c 8.6 blood sugar fluctuating at times >200, currently well-controlled on SSI Recent Labs  Lab 04/30/24 1735 04/30/24 2114 05/01/24 0607 05/01/24 0822 05/01/24 1028  GLUCAP 151* 108* 193* 134* 95     History of brain aneurysm Stable on imaging   Depression Mood stable on mirtazapine  and trazodone    COPD Continue home albuterol    OSA Continue home CPAP   History of cataracts History of DVT Left eye blindness - Noted   DVT prophylaxis: heparin  injection 5,000 Units Start: 04/29/24 1530 Code Status:   Code Status: Full Code Family Communication: plan of care discussed with patient/ at bedside. Patient status is: Remains hospitalized because of severity of illness Level of care: Progressive   Dispo: The patient is from: home            Anticipated disposition: TBD  Objective: Vitals last 24 hrs: Vitals:   05/01/24 0329 05/01/24 0737 05/01/24 0853 05/01/24 1021  BP: 113/61 114/80 (!) 140/64 (!) (P) 147/66  Pulse: 64 66  (P) 65  Resp: 18 18  (P) 18  Temp: 98.4 F (36.9 C) 98.8 F (37.1 C)  (P) 98.7 F (37.1 C)  TempSrc: Oral Oral  (P) Oral  SpO2: (!) 85% 93%  (P) 95%  Weight:    (P) 74.5 kg  Height:    (P) 5\' 6"  (1.676 m)    Physical Examination: General exam: alert awake, oriented  HEENT:Oral mucosa moist, Ear/Nose WNL grossly Respiratory system: Bilaterally clear BS,no use of accessory muscle Cardiovascular system: S1 & S2 +, No JVD. Gastrointestinal system: Abdomen soft,NT,ND, BS+ Nervous System: Alert, awake, moving all extremities,and following commands. Extremities: LE edema neg, left elbow swollen tender but no erythema or warmth .  Left elbow joint mobility limited by pain and effusion  Skin: No rashes,no icterus.  Fistula right upper  extremity MSK: Normal muscle bulk,tone, power   Data Reviewed: I have personally reviewed following labs and imaging studies ( see epic result tab) CBC: Recent Labs  Lab 04/25/24 0529 04/26/24 0959 04/27/24 1758 04/29/24 1255 04/29/24 1302 04/30/24 0700  WBC 8.3 8.2 7.8 10.0  --  9.4  NEUTROABS  --   --   --  7.3  --   --   HGB 10.5* 10.3* 10.8* 10.2* 10.5* 10.1*  HCT 32.1* 31.3* 33.2* 33.2* 31.0* 31.3*  MCV 88.9 89.7 91.0 95.1  --  91.8  PLT 172 190 240 267  --  278   CMP: Recent Labs  Lab 04/25/24 0529 04/26/24 0959 04/27/24 1758 04/29/24 1255 04/29/24 1302 04/30/24 0700 05/01/24 0612  NA 133* 132* 135 135 134* 133* 133*  K 3.9 4.4 4.2 4.6 4.6 4.9 4.4  CL 97* 95* 95* 96* 99 94* 93*  CO2 22 22 25 24   --  23 23  GLUCOSE 143* 176* 139* 160* 159* 205* 174*  BUN 43* 62* 36* 56* 51* 67* 37*  CREATININE 7.09* 9.03* 5.83* 8.05* 8.10* 9.54* 6.61*  CALCIUM  8.6* 8.7* 8.9 8.9  --  8.6* 9.1  MG 1.8  --   --   --   --   --   --  PHOS  --  5.8* 3.5  --   --   --   --    GFR: Estimated Creatinine Clearance: 9.2 mL/min (A) (by C-G formula based on SCr of 6.61 mg/dL (H)). Recent Labs  Lab 04/26/24 0959 04/27/24 1758 04/29/24 1255 04/30/24 0700  AST  --   --  12* 11*  ALT  --   --  10 10  ALKPHOS  --   --  79 79  BILITOT  --   --  0.9 0.4  PROT  --   --  7.3 7.1  ALBUMIN  3.4* 3.4* 3.4* 3.3*   No results for input(s): "LIPASE", "AMYLASE" in the last 168 hours. No results for input(s): "AMMONIA" in the last 168 hours. Coagulation Profile:  Recent Labs  Lab 04/29/24 1255 04/30/24 2250  INR 1.1 1.1   Unresulted Labs (From admission, onward)     Start     Ordered   05/01/24 1122  Type and screen [facility]  Once,   R       Comments: [facility]    05/01/24 1121   05/01/24 0500  Basic metabolic panel with GFR  Daily,   R     Question:  Specimen collection method  Answer:  Lab=Lab collect   04/30/24 1355   05/01/24 0500  CBC  Daily,    R     Question:  Specimen collection method  Answer:  Lab=Lab collect   04/30/24 1355           Antimicrobials/Microbiology: Anti-infectives (From admission, onward)    None         Component Value Date/Time   SDES BLOOD LEFT WRIST 02/10/2022 2205   SPECREQUEST  02/10/2022 2205    BOTTLES DRAWN AEROBIC AND ANAEROBIC Blood Culture adequate volume   CULT  02/10/2022 2205    NO GROWTH 5 DAYS Performed at [facility], [address] [place], [place] [address]    REPTSTATUS 02/15/2022 FINAL 02/10/2022 2205    Procedure(s) (LRB): RADIOLOGY WITH ANESTHESIA (N/A) Medications reviewed:  Scheduled Meds:  acetaminophen   1,000 mg Oral Once   [MAR Hold] aspirin  EC  81 mg Oral Daily   [MAR Hold] atorvastatin   80 mg Oral q1800   [MAR Hold] Chlorhexidine  Gluconate Cloth  6 each Topical Q0600   fentaNYL        [MAR Hold] heparin   5,000 Units Subcutaneous Q8H   [MAR Hold] insulin  aspart  0-15 Units Subcutaneous TID WC   [MAR Hold] mirtazapine   30 mg Oral QHS   [MAR Hold] ticagrelor   90 mg Oral BID   [MAR Hold] traZODone   25 mg Oral QHS   Continuous Infusions:  Lesa Rape, MD [facility] 05/01/2024, 11:41 AM

## 2024-05-01 NOTE — Sedation Documentation (Signed)
 Pt will be under care of anesthesia. See CRNA charting.

## 2024-05-01 NOTE — Anesthesia Postprocedure Evaluation (Signed)
 Anesthesia Post Note  Patient: Terry Arroyo  Procedure(s) Performed: RADIOLOGY WITH ANESTHESIA     Patient location during evaluation: PACU Anesthesia Type: General Level of consciousness: awake and alert Pain management: pain level controlled Vital Signs Assessment: post-procedure vital signs reviewed and stable Respiratory status: spontaneous breathing, nonlabored ventilation and respiratory function stable Cardiovascular status: blood pressure returned to baseline Postop Assessment: no apparent nausea or vomiting Anesthetic complications: no   No notable events documented.  Last Vitals:  Vitals:   05/01/24 1350 05/01/24 1400  BP: 128/63 126/68  Pulse: 65 65  Resp: 18 19  Temp:    SpO2: 95% 92%    Last Pain:  Vitals:   05/01/24 1345  TempSrc:   PainSc: 0-No pain                 [name]

## 2024-05-01 NOTE — TOC CAGE-AID Note (Signed)
 Transition of Care [facility]) - CAGE-AID Screening   Patient Details  Name: CARLISA CUADROS MRN: [id number] Date of Birth: 1961-06-11  [facility] of [facility]) CM/SW Contact:    Jannice Mends, LCSW Phone Number: 05/01/2024, 4:27 PM   Clinical Narrative: Patient declined substance use. Resources not provided.    CAGE-AID Screening:    Have You Ever Felt You Ought to Cut Down on Your Drinking or Drug Use?: No Have People Annoyed You By Critizing Your Drinking Or Drug Use?: No Have You Felt Bad Or Guilty About Your Drinking Or Drug Use?: No Have You Ever Had a Drink or Used Drugs First Thing In The Morning to Steady Your Nerves or to Get Rid of a Hangover?: No CAGE-AID Score: 0  Substance Abuse Education Offered: No

## 2024-05-01 NOTE — Progress Notes (Addendum)
 STROKE TEAM PROGRESS NOTE   INTERIM HISTORY/SUBJECTIVE Code stroke was called this morning by IR PA for patient with increased confusion and speech difficulties and was vomiting. On chart review patient had received 0.5 mg of Dilaudid  at 0628.  CT head with no acute process.  While in CT patient's speech started to improve.  She did have 1 episode of vomiting and was given 4 mg of Zofran .  She is complaining of tremendous pain in her left elbow.  Patient not a TNK candidate due to occultly finding out last known well.  Patient was taken to stat MRI for treatment decision and MRI brain with no acute process  Patient is scheduled to have right ICA stenting this afternoon  CBC    Component Value Date/Time   WBC 9.4 04/30/2024 0700   RBC 3.41 (L) 04/30/2024 0700   HGB 10.1 (L) 04/30/2024 0700   HGB 12.6 11/18/2013 0138   HCT 31.3 (L) 04/30/2024 0700   HCT 37.9 11/18/2013 0138   PLT 278 04/30/2024 0700   PLT 444 (H) 11/18/2013 0138   MCV 91.8 04/30/2024 0700   MCV 83 11/18/2013 0138   MCH 29.6 04/30/2024 0700   MCHC 32.3 04/30/2024 0700   RDW 14.3 04/30/2024 0700   RDW 14.1 11/18/2013 0138   LYMPHSABS 1.4 04/29/2024 1255   MONOABS 0.8 04/29/2024 1255   EOSABS 0.4 04/29/2024 1255   BASOSABS 0.1 04/29/2024 1255    BMET    Component Value Date/Time   NA 133 (L) 05/01/2024 0612   NA 134 (L) 11/18/2013 0138   K 4.4 05/01/2024 0612   K 3.8 11/18/2013 0138   CL 93 (L) 05/01/2024 0612   CL 98 11/18/2013 0138   CO2 23 05/01/2024 0612   CO2 29 11/18/2013 0138   GLUCOSE 174 (H) 05/01/2024 0612   GLUCOSE 348 (H) 11/18/2013 0138   BUN 37 (H) 05/01/2024 0612   BUN 11 11/18/2013 0138   CREATININE 6.61 (H) 05/01/2024 0612   CREATININE 0.87 11/18/2013 0138   CALCIUM  9.1 05/01/2024 0612   CALCIUM  9.4 11/18/2013 0138   GFRNONAA 7 (L) 05/01/2024 0612   GFRNONAA >60 11/18/2013 0138    IMAGING past 24 hours VAS US  UPPER EXTREMITY VENOUS DUPLEX Result Date: 05/01/2024 UPPER VENOUS STUDY   Patient Name:  Terry Arroyo  Date of Exam:   05/01/2024 Medical Rec #: [id number]        Accession #:    [id number] Date of Birth: [birth year]         Patient Gender: F Patient Age:   88 years Exam Location:  [facility] Procedure:      VAS US  UPPER EXTREMITY VENOUS DUPLEX Referring Phys: RAMESH KC --------------------------------------------------------------------------------  Indications: Pain Risk Factors: None identified. Comparison Study: No prior studies. Performing Technologist: Lerry Ransom RVT  Examination Guidelines: A complete evaluation includes B-mode imaging, spectral Doppler, color Doppler, and power Doppler as needed of all accessible portions of each vessel. Bilateral testing is considered an integral part of a complete examination. Limited examinations for reoccurring indications may be performed as noted.  Right Findings: +----------+------------+---------+-----------+----------+-------+ RIGHT     CompressiblePhasicitySpontaneousPropertiesSummary +----------+------------+---------+-----------+----------+-------+ Subclavian    Full       Yes       Yes                      +----------+------------+---------+-----------+----------+-------+  Left Findings: +----------+------------+---------+-----------+----------+-------+ LEFT      CompressiblePhasicitySpontaneousPropertiesSummary +----------+------------+---------+-----------+----------+-------+ IJV  Full       Yes       Yes                      +----------+------------+---------+-----------+----------+-------+ Subclavian               Yes       Yes                      +----------+------------+---------+-----------+----------+-------+ Axillary      Full       Yes       Yes                      +----------+------------+---------+-----------+----------+-------+ Brachial      Full                                           +----------+------------+---------+-----------+----------+-------+ Radial        Full                                          +----------+------------+---------+-----------+----------+-------+ Ulnar         Full                                          +----------+------------+---------+-----------+----------+-------+ Cephalic      Full                                          +----------+------------+---------+-----------+----------+-------+ Basilic       Full                                          +----------+------------+---------+-----------+----------+-------+  Summary:  Right: No evidence of thrombosis in the subclavian.  Left: No evidence of deep vein thrombosis in the upper extremity. No evidence of superficial vein thrombosis in the upper extremity.  *See table(s) above for measurements and observations.    Preliminary    MR BRAIN WO CONTRAST Result Date: 05/01/2024 CLINICAL DATA:  63 year old female code stroke presentation this morning. Dialysis patient. Severe atherosclerosis in the head and neck on CTA this month. EXAM: MRI HEAD WITHOUT CONTRAST LIMITED TECHNIQUE: Multiplanar, multiecho pulse sequences of the brain and surrounding structures were obtained without intravenous contrast. COMPARISON:  Plain head CT 0847 hours. CT head, CTA, CTP 04/23/24. FINDINGS: Neurology requests a study limited to DWI, sagittal T1 and axial FLAIR imaging. No restricted diffusion to suggest acute infarction. No midline shift, mass effect, evidence of mass lesion, ventriculomegaly, extra-axial collection or acute intracranial hemorrhage. Cervicomedullary junction and pituitary are within normal limits. Axial FLAIR imaging demonstrates scattered, generally small hyperintense foci in the bilateral cerebral white matter. The extent is mild to moderate for age. No cortical encephalomalacia identified. Incidental midline nasopharyngeal retention cysts (Tornwaldt cyst, normal variant). Negative  for age visible cervical spine. IMPRESSION: Negative for acute infarct. No evidence of acute intracranial abnormality on limited exam. Electronically Signed   By: Arline Laity.D.  On: 05/01/2024 10:26   CT HEAD CODE STROKE WO CONTRAST Result Date: 05/01/2024 CLINICAL DATA:  Code stroke. Neuro deficit, concern for stroke, left arm weakness. EXAM: CT HEAD WITHOUT CONTRAST TECHNIQUE: Contiguous axial images were obtained from the base of the skull through the vertex without intravenous contrast. RADIATION DOSE REDUCTION: This exam was performed according to the departmental dose-optimization program which includes automated exposure control, adjustment of the mA and/or kV according to patient size and/or use of iterative reconstruction technique. COMPARISON:  CT head and MRI head 04/29/2024. FINDINGS: Brain: No acute intracranial hemorrhage. No CT evidence of acute infarct. No edema, mass effect, or midline shift. The basilar cisterns are patent. Ventricles: The ventricles are normal. Vascular: Atherosclerotic calcifications of the carotid siphons and intracranial vertebral arteries. No hyperdense vessel. Skull: No acute or aggressive finding. Orbits: Right lens replacement.  Orbits otherwise unremarkable. Sinuses: Mild mucosal thickening in the left posterior ethmoid air cells. Postsurgical changes of the sphenoid sinuses. Other: Trace fluid in the right mastoid air cells. ASPECTS [facility] Stroke Program Early CT Score) - Ganglionic level infarction (caudate, lentiform nuclei, internal capsule, insula, M1-M3 cortex): 7 - Supraganglionic infarction (M4-M6 cortex): 3 Total score (0-10 with 10 being normal): 10 IMPRESSION: 1. No CT evidence of acute intracranial abnormality. 2. ASPECTS is 10 These results were communicated to Dr. Christiane Cowing at 9:06 am on 05/01/2024 by text page via the [facility] messaging system. Electronically Signed   By: Denny Flack M.D.   On: 05/01/2024 09:07   US  LT UPPER EXTREM LTD SOFT TISSUE NON  VASCULAR Result Date: 04/30/2024 CLINICAL DATA:  Elbow pain and swelling.  Joint effusion. EXAM: ULTRASOUND LEFT UPPER EXTREMITY LIMITED TECHNIQUE: Ultrasound examination of the upper extremity soft tissues was performed in the area of clinical concern. COMPARISON:  Elbow radiographs 04/29/2024 and 04/30/2024. CT 04/30/2024. FINDINGS: Examination was targeted to the posterior aspect of the left elbow. There is generalized subcutaneous edema, as seen on CT. A focal fluid collection posteriorly measures approximately 3.6 x 1.4 x 2.4 cm. This has a deep location adjacent to bone, and as correlated with same day CT likely represents the known elbow joint effusion. No other focal periarticular fluid collections are identified. IMPRESSION: Focal fluid collection posterior to the left elbow likely represents the known elbow joint effusion. No other focal periarticular fluid collections identified. If there is concern of septic arthritis, consider further evaluation with MRI with and without contrast or arthrocentesis. Electronically Signed   By: Elmon Hagedorn M.D.   On: 04/30/2024 21:44   CT ELBOW LEFT WO CONTRAST Result Date: 04/30/2024 CLINICAL DATA:  Bone lesion, elbow, incidental Elbow pain and swelling with joint effusion on radiographs. EXAM: CT OF THE UPPER LEFT EXTREMITY WITHOUT CONTRAST TECHNIQUE: Multidetector CT imaging of the left elbow was performed according to the standard protocol. RADIATION DOSE REDUCTION: This exam was performed according to the departmental dose-optimization program which includes automated exposure control, adjustment of the mA and/or kV according to patient size and/or use of iterative reconstruction technique. COMPARISON:  Elbow radiographs 04/29/2024 and 04/30/2024. Ultrasound 04/30/2024. FINDINGS: Bones/Joint/Cartilage No evidence of acute fracture, dislocation or bone destruction. Mild spurring of the coronoid process. There is a moderate-sized elbow joint effusion without  evidence of erosive disease or intra-articular air. Ligaments Suboptimally assessed by CT. Muscles and Tendons No intramuscular fluid collection or focal atrophy identified on noncontrast imaging. As evaluated by CT, the biceps and triceps tendons appear intact. Soft tissues Dorsal subcutaneous edema within the distal upper arm, extending posterior  to the elbow and into the dorsal aspect of the proximal forearm. There may be a small amount of fluid within the olecranon bursa. Small amount of air in the antecubital fossa without associated focal fluid collection, likely related to a peripheral IV or reported recent contrast extravasation. IMPRESSION: 1. Moderate-sized elbow joint effusion without evidence of erosive disease or intra-articular air. This is nonspecific and could be degenerative or inflammatory. If there is clinical concern for septic arthritis, consider arthrocentesis. 2. Dorsal subcutaneous edema within the distal upper arm, extending posterior to the elbow and into the dorsal aspect of the proximal forearm. No associated focal fluid collection. 3. Small amount of air in the antecubital fossa without associated focal fluid collection, likely related to a peripheral IV or reported recent contrast extravasation. 4. No acute osseous findings or evidence of osteomyelitis. Electronically Signed   By: Elmon Hagedorn M.D.   On: 04/30/2024 21:34   DG Elbow 2 Views Left Result Date: 04/30/2024 CLINICAL DATA:  Pain and swelling after an IV insertion. EXAM: LEFT ELBOW - 2 VIEW COMPARISON:  04/29/2024 FINDINGS: Soft tissue swelling over the olecranon. Mild subcutaneous edema in the posterior aspect of the distal upper arm. No acute osseous abnormality. IMPRESSION: 1. Edema over the olecranon may be related to extravasated contrast material or olecranon bursitis. 2. Subcutaneous edema in the dorsal aspect of the distal upper arm, compatible with recent contrast extravasation. Electronically Signed   By:  Shearon Denis M.D.   On: 04/30/2024 19:27    Vitals:   05/01/24 0329 05/01/24 0737 05/01/24 0853 05/01/24 1021  BP: 113/61 114/80 (!) 140/64 (!) (P) 147/66  Pulse: 64 66  (P) 65  Resp: 18 18  (P) 18  Temp: 98.4 F (36.9 C) 98.8 F (37.1 C)  (P) 98.7 F (37.1 C)  TempSrc: Oral Oral  (P) Oral  SpO2: (!) 85% 93%  (P) 95%  Weight:    (P) 74.5 kg  Height:    (P) 5\' 6"  (1.676 m)     PHYSICAL EXAM General:  Alert, well-nourished, well-developed patient, anxious and crying Psych:  Mood and affect appropriate for situation CV: Regular rate and rhythm on monitor Respiratory:  Regular, unlabored respirations on room air GI: Abdomen soft and nontender   NEURO:  Mental Status: AA&Ox3, mild confusion Speech/Language: speech is without aphasia. dysarthria Naming, repetition, fluency, and comprehension intact.  Cranial Nerves:  II: PERRL. Visual fields full.  III, IV, VI: EOMI. Eyelids elevate symmetrically.  V: Sensation is intact to light touch and symmetrical to face.  VII: Face is symmetrical resting and smiling VIII: hearing intact to voice. IX, X: Palate elevates symmetrically. Phonation is normal.  [id number] shrug 5/5. XII: tongue is midline without fasciculations. Motor: 5/5 strength in right arm and right leg, left arm limited due to pain , left leg 4/5 Tone: is normal and bulk is normal Sensation- decreased sensation on left body Extinction absent to light touch to DSS.   Coordination: FTN intact bilaterally, HKS: no ataxia in BLE.No drift.  Gait- deferred  Most Recent NIH 5    ASSESSMENT/PLAN  Ms. HALAH JAREMA is a 63 y.o. female with history of  Stroke 2014 affecting left side, Left ICA stent placement 04/2024 for acute stroke and discharged 5/10 on aspirin  and brilinta , ESRD, CAD, CHFrEF, s/p CABG 2018, MI 2018, OSA who was BIB EMS as an activated CODE STROKE due to left sided weakness and slurred speech. NIH on Admission 10  Right brain TIA, etiology:  likely  right ICA tandem stenosis Code Stroke  CT head No acute abnormality. ASPECTS 10.    MRI  No acute infarction, hemorrhage, or mass  MRA  Greater than 70% stenosis of the proximal right ICA. Right greater than left intracranial ICA atherosclerosis and stenosis  CT head 5/13 no acute process MRI brain 5/13 no acute process Carotid Doppler right ICA 40 to 59% stenosis, left ICA stent patent less than 50% stenosis. Cerebral angiogram showed right ICA 45 to 50% stenosis.  Right ICA cavernous segment 50% stenosis. VTE prophylaxis - hep SQ aspirin  81 mg daily and Brilinta  (ticagrelor ) 90 mg bid prior to admission, now on aspirin  81 mg daily and Brilinta  (ticagrelor ) 90 mg bid per interventional radiologist  Therapy recommendations:  Pending Disposition:  pending   Hx of Stroke/TIA Symptomatic bilateral carotid stenosis s/p L ICA stent  Per report, stroke in 2014 with left-sided residual Admitted 04/23/2024 for right-sided weakness and aphasia.  CT no acute abnormality.  CTA head and neck left ICA string sign, bilateral ICA siphon severe stenosis, right ICA 70 to 80% stenosis.  Bilateral moderate VA stenosis.  MRI no infarct but chronic CSVT with collateral flow.  CT perfusion negative.  2D Echo EF 35-40%.  EEG negative for seizure.  LDL 75, A1c 8.6.  Status post left ICA stent placement.  Discharged on aspirin  and Brilinta  and Lipitor  80.   This time admitted with left sided symptoms concerning for right ICA symptomatic stenosis  Carotid Doppler right ICA 40 to 59% stenosis, left ICA stent patent less than 50% stenosis. Cerebral angiogram showed right ICA 45 to 50% stenosis.  Right ICA cavernous segment 50% stenosis. No right carotid intervention needed at this time  Hypertension Home meds: Amlodipine  10 mg daily, carvedilol  25 mg twice daily Stable Avoid low BP given carotid stenosis Long-term goal normotensive   Hyperlipidemia Home meds: Atorvastatin  80 mg daily Lipitor  80 resumed in  hospital LDL 75, goal < 70 Continue statin at discharge   Diabetes type II Uncontrolled Home meds: NovoLog  70/30 15 units 3 times daily with meals HgbA1c 8.6, goal < 7.0 CBGs SSI Recommend close follow-up with PCP for better DM control   End-stage renal disease on dialysis Patient is typically dialyzed Monday Wednesday Friday Nephrology consulted for inpatient dialysis Had HD today   Left elbow pain Orthopedic on board, on left arm sling CT left elbow showed subcutaneous swelling, no fracture or dislocation Supportive care Pain management  Other Stroke Risk Factors Coronary artery disease status post CABG Congestive heart failure with EF 35 to 40% OSA  Other medical issues Cerebral aneurysm, CVA and MRA showed 2 to 3 mm left MCA bifurcation saccular aneurysm.  Continue follow-up with Dr. Devesh for as outpatient for monitoring. Pituitary adenoma status postsurgery COPD continue home meds  Hospital day # 0   Jonette Nestle DNP, ACNPC-AG  [facility]  ATTENDING NOTE: I reviewed above note and agree with the assessment and plan. Pt was seen and examined.   Patient this morning complaining of left elbow pain, received Dilaudid  with 6:28 AM.  After that patient self-reported went to bathroom with help and then felt dizzy, blurred vision and left-sided worsening weakness and slurred speech.  Code stroke called, on exam patient neuro similar to yesterday except slurred speech but fluctuating with reassurance, MRI brain stat showed no stroke. Went for cerebral angiogram later showed right ICA bulb 45 to 50%, siphon 50% tandem stenosis, no intervention needed.  Continue DAPT and statin.  Left elbow pain management per primary team.  For detailed assessment and plan, please refer to above as I have made changes wherever appropriate.   Consuelo Denmark, MD PhD Stroke Neurology 05/01/2024 6:19 PM  I spent extensive face-to-face time with the patient, more than 50% of which was  spent in counseling and coordination of care, reviewing test results, images and medication, and discussing the diagnosis, treatment plan and potential prognosis. This patient's care requiresreview of multiple databases, neurological assessment, discussion with family, other specialists and medical decision making of high complexity.        To contact Stroke Continuity provider, please refer to [email or web address]. After hours, contact General Neurology

## 2024-05-01 NOTE — Progress Notes (Signed)
 SLP Cancellation Note  Patient Details Name: Terry Arroyo MRN: [id number] DOB: 10/14/1961   Cancelled treatment:       Reason Eval/Treat Not Completed: Patient at procedure or test/unavailable. SLP will continue following.    Amil Kale, M.A., CCC-SLP Speech Language Pathology, Acute Rehabilitation Services  Secure Chat preferred [phone]  05/01/2024, 1:13 PM

## 2024-05-01 NOTE — H&P (Signed)
 Chief Complaint: Patient was seen in consultation today for   at the request of  right internal carotid artery stenosis   Referring Physician(s):  right internal carotid artery stenosis   Supervising Physician: Luellen Sages  Patient Status: [facility] - In-pt  Full Code  History of Present Illness: Terry Arroyo is a 63 y.o. female with history of diabetes, hypertension, hyperlipidemia, GERD, CAD-s/p CABG, anemia, CHF, ESRD-on dialysis, brain aneurysm, depression, DVT, pituitary adenoma, sleep apnea and CVA 5/5-s/p left internal carotid stent placement. Patient presented to the emergency room 5/11 with left sided weakness and slurred speech. Code stroke activated, MRI and CT head did not show evidence of acute CVA. However, MRI noted right greater than left intracranial ICA atherosclerosis and stenosis, similar to prior CTA. MR angio head and neck performed 04/29/24, impression below.      MR angio neck 04/29/24    MR head 04/29/24      Neurology saw the patient in consult 5/11 and recommended admission for TIA workup. Neuro IR consulted by neurology to discuss patient's care. Patient reports today that her symptoms improved and she does not have weakness/numbness. She reports taking her medications as prescribed at home.    Earlier the morning of 5/13, patient reported dizziness and weakness in the left side of the head. She was actively vomiting and reported visual changes. Patient was having difficulty following commands. Code stroke activated and stroke team evaluated the patient. CT and Brain MRI's did not reveal acute abnormalities, impression's below.   CT head 05/01/24    Brain MRI 05/01/24    After re-evaluation, patient reports her symptoms from earlier resolved. She notes left elbow pain that radiates up and down the arm. This is being followed by orthopedics. Patient denies: weakness, numbness, dizziness, nausea.     Past Medical History:  Diagnosis Date    ACS (acute coronary syndrome) (HCC) 03/24/2019   Acute ischemic left MCA stroke (HCC) 04/23/2024   Acute on chronic systolic CHF (congestive heart failure) (HCC) 02/26/2020   Acute pain of left thigh 02/09/2022   Acute pulmonary edema (HCC)    Acute renal failure superimposed on stage 4 chronic kidney disease (HCC)    Anasarca 02/26/2020   Anemia    Angina at rest [facility])    Angioedema 03/20/2021   Lisinopril    Anterior cerebral aneurysm    approximately 5 mm; left MCA    Anxiety    Aortic atherosclerosis (HCC)    Asthma    Benign hypertensive kidney disease with chronic kidney disease 03/10/2020   Blind left eye    CHF (congestive heart failure) (HCC)    COPD (chronic obstructive pulmonary disease) (HCC)    COPD exacerbation (HCC) 10/10/2020   Coronary artery disease    COVID-19 virus infection 03/24/2019   Depression    DOE (dyspnea on exertion)    DVT (deep venous thrombosis) (HCC) 12/30/2016   Left leg dvt as per pt while she was in hospital for cabg  Is on eliquis since august  Doppler done in [place]  Shows   acute  occlusive deep vein  thrombosis  noted in the soleal  vein (a short  segment) at the  mid calf  3/18 doppler is negative     Last Assessment & Plan:   Will rpt doppler   If negative will stop eliquis  Formatting of this note might be different from the original.  Left leg dv   ESRD (end stage renal disease) (HCC)  Gait instability    Genital herpes    GERD (gastroesophageal reflux disease)    HFrEF (heart failure with reduced ejection fraction) (HCC)    History of 2019 novel coronavirus disease (COVID-19) 03/2019   [facility] [place]    History of DVT (deep vein thrombosis)    History of DVT (deep vein thrombosis)    Hx of CABG 2018   x 4; performed in [place]    Hyperlipidemia    Hypertension    Hypertensive urgency 11/24/2019   MI (myocardial infarction) (HCC) 2018   no stents   Pituitary adenoma (HCC)    PVD (posterior vitreous  detachment), right eye    Secondary hyperparathyroidism (HCC)    Sepsis (HCC) 09/05/2021   Sleep apnea    non-compliant with nocturnal PAP therapy   Stroke [facility]) 2014   stated affected left side.   T2DM (type 2 diabetes mellitus) [facility])     Past Surgical History:  Procedure Laterality Date   A/V FISTULAGRAM Right 12/29/2021   Procedure: A/V FISTULAGRAM;  Surgeon: Jackquelyn Mass, MD;  Location: [facility] INVASIVE CV LAB;  Service: Cardiovascular;  Laterality: Right;   A/V FISTULAGRAM Right 06/15/2022   Procedure: A/V Fistulagram;  Surgeon: Jackquelyn Mass, MD;  Location: [facility] INVASIVE CV LAB;  Service: Cardiovascular;  Laterality: Right;   A/V FISTULAGRAM Right 04/17/2024   Procedure: A/V Fistulagram;  Surgeon: Jackquelyn Mass, MD;  Location: [facility] INVASIVE CV LAB;  Service: Cardiovascular;  Laterality: Right;   AV FISTULA PLACEMENT Right 01/28/2021   Procedure: ARTERIOVENOUS (AV) FISTULA CREATION;  Surgeon: Jackquelyn Mass, MD;  Location: [facility] ORS;  Service: Vascular;  Laterality: Right;   AV FISTULA PLACEMENT Right 05/13/2021   Procedure: INSERTION OF ARTERIOVENOUS (AV) GORE-TEX GRAFT ARM ( BRACHIAL AXILLARY );  Surgeon: Jackquelyn Mass, MD;  Location: [facility] ORS;  Service: Vascular;  Laterality: Right;   BRAIN SURGERY  1986   pituitary tumor   BREAST EXCISIONAL BIOPSY Left yrs ago   benign   CESAREAN SECTION     COLONOSCOPY N/A 05/26/2023   Procedure: COLONOSCOPY;  Surgeon: Quintin Buckle, DO;  Location: [facility] ENDOSCOPY;  Service: Gastroenterology;  Laterality: N/A;  DIALYSIS   CORONARY ARTERY BYPASS GRAFT  2018   x 4 vessels; performed in [place]    DIALYSIS/PERMA CATHETER INSERTION N/A 11/07/2020   Procedure: DIALYSIS/PERMA CATHETER INSERTION;  Surgeon: Celso College, MD;  Location: [facility] INVASIVE CV LAB;  Service: Cardiovascular;  Laterality: N/A;   DIALYSIS/PERMA CATHETER REMOVAL N/A 06/12/2021   Procedure: DIALYSIS/PERMA CATHETER REMOVAL;  Surgeon: Celso College, MD;   Location: [facility] INVASIVE CV LAB;  Service: Cardiovascular;  Laterality: N/A;   ESOPHAGOGASTRODUODENOSCOPY (EGD) WITH PROPOFOL  N/A 12/22/2021   Procedure: ESOPHAGOGASTRODUODENOSCOPY (EGD) WITH PROPOFOL ;  Surgeon: Shane Darling, MD;  Location: [facility] ENDOSCOPY;  Service: Gastroenterology;  Laterality: N/A;  IDDM   EYE SURGERY Right 2021   cataracts    IR CT HEAD LTD  04/23/2024   IR PERCUTANEOUS ART THROMBECTOMY/INFUSION INTRACRANIAL INC DIAG ANGIO  04/23/2024   IR US  GUIDE VASC ACCESS RIGHT  04/23/2024   PERIPHERAL VASCULAR THROMBECTOMY Right 04/03/2021   Procedure: A/V Fistulagram ;  Surgeon: Jackquelyn Mass, MD;  Location: [facility] INVASIVE CV LAB;  Service: Cardiovascular;  Laterality: Right;   RADIOLOGY WITH ANESTHESIA N/A 04/23/2024   Procedure: RADIOLOGY WITH ANESTHESIA;  Surgeon: Radiologist, Medication, MD;  Location: [facility] OR;  Service: Radiology;  Laterality: N/A;    Allergies: Hydralazine , Kiwi extract, Lisinopril , Shellfish allergy, Betadine [  povidone iodine ], Septra [sulfamethoxazole-trimethoprim], Metformin, and Tape  Medications: Prior to Admission medications   Medication Sig Start Date End Date Taking? Authorizing Provider  acetaminophen  (TYLENOL ) 500 MG tablet Take 1,000 mg by mouth every 6 (six) hours as needed for mild pain (pain score 1-3) or moderate pain (pain score 4-6).   Yes [provider]  albuterol  (VENTOLIN  HFA) 108 (90 Base) MCG/ACT inhaler Inhale 2 puffs into the lungs every 6 (six) hours as needed for wheezing or shortness of breath.   Yes [provider]  amLODipine  (NORVASC ) 10 MG tablet Take 1 tablet (10 mg total) by mouth daily. 03/03/20  Yes Luna Salinas, MD  aspirin  81 MG EC tablet Take 1 tablet (81 mg total) by mouth daily. 02/10/21  Yes Brenna Cam, MD  atorvastatin  (LIPITOR ) 80 MG tablet Take 1 tablet (80 mg total) by mouth daily at 6 PM. 11/26/19  Yes Patel, Pranav M, MD  B Complex-C-Folic Acid  (RENA-VITE RX) 1 MG TABS Take 1 tablet by mouth  daily. 03/17/23  Yes [provider]  carvedilol  (COREG ) 25 MG tablet Take 25 mg by mouth 2 (two) times daily.   Yes [provider]  gabapentin  (NEURONTIN ) 300 MG capsule Take 300 mg by mouth at bedtime.   Yes [provider]  lidocaine -prilocaine  (EMLA ) cream Apply 1 Application topically every Monday, Wednesday, and Friday with hemodialysis.   Yes [provider]  mirtazapine  (REMERON ) 30 MG tablet Take 30 mg by mouth at bedtime.   Yes [provider]  NOVOLOG  MIX 70/30 FLEXPEN (70-30) 100 UNIT/ML FlexPen Inject 7-15 Units into the skin 3 (three) times daily with meals.   Yes [provider]  topiramate  (TOPAMAX ) 50 MG tablet Take 50 mg by mouth daily. 11/16/23  Yes [provider]  traZODone  (DESYREL ) 50 MG tablet Take 0.5 tablets (25 mg total) by mouth at bedtime. Patient taking differently: Take 50 mg by mouth at bedtime. 04/16/22  Yes Ree Candy, MD  vitamin B-12 (CYANOCOBALAMIN ) 1000 MCG tablet Take 1,000 mcg by mouth daily.   Yes [provider]  ezetimibe  (ZETIA ) 10 MG tablet Take 1 tablet (10 mg total) by mouth daily. Patient not taking: Reported on 04/29/2024 04/29/24   de Thayne Fine, Cortney E, NP  ticagrelor  (BRILINTA ) 90 MG TABS tablet Take 1 tablet (90 mg total) by mouth 2 (two) times daily. Patient not taking: Reported on 04/29/2024 04/28/24   de Greer Leak, NP     Family History  Problem Relation Age of Onset   CAD Mother    CAD Brother    Breast cancer Sister 39    Social History   Socioeconomic History   Marital status: Single    Spouse name: Not on file   Number of children: Not on file   Years of education: Not on file   Highest education level: Not on file  Occupational History    Comment: disabled  Tobacco Use   Smoking status: Former    Current packs/day: 0.00    Types: Cigarettes    Quit date: 2018    Years since quitting: 7.3   Smokeless tobacco: Never  Vaping Use    Vaping status: Never Used  Substance and Sexual Activity   Alcohol  use: Not Currently   Drug use: Not Currently   Sexual activity: Not Currently  Other Topics Concern   Not on file  Social History Narrative   Patient lives alone.  Feels safe in her home.  Social Drivers of Corporate investment banker Strain: Low Risk  (10/13/2023)   Received from [facility]   Overall Financial Resource Strain (CARDIA)    Difficulty of Paying Living Expenses: Not very hard  Food Insecurity: No Food Insecurity (04/29/2024)   Hunger Vital Sign    Worried About Running Out of Food in the Last Year: Never true    Ran Out of Food in the Last Year: Never true  Transportation Needs: No Transportation Needs (04/29/2024)   PRAPARE - Administrator, Civil Service (Medical): No    Lack of Transportation (Non-Medical): No  Physical Activity: Not on file  Stress: Not on file  Social Connections: Unknown (04/29/2024)   Social Connection and Isolation Panel [NHANES]    Frequency of Communication with Friends and Family: Twice a week    Frequency of Social Gatherings with Friends and Family: Twice a week    Attends Religious Services: 1 to 4 times per year    Active Member of [employer] or Organizations: No    Attends Banker Meetings: Never    Marital Status: Patient declined     Review of Systems: A 12 point ROS discussed and pertinent positives are indicated in the HPI above.  All other systems are negative.  Review of Systems  Constitutional:  Negative for fever.  Respiratory:  Negative for shortness of breath.   Gastrointestinal:  Negative for nausea and vomiting.  Neurological:  Negative for weakness and numbness.    Vital Signs: BP (!) (P) 147/66   Pulse (P) 65   Temp (P) 98.7 F (37.1 C) (Oral)   Resp (P) 18   Ht (P) 5\' 6"  (1.676 m)   Wt (P) 164 lb 3.9 oz (74.5 kg)   SpO2 (P) 95%   BMI (P) 26.51 kg/m     Physical Exam HENT:     Head:  Normocephalic and atraumatic.     Mouth/Throat:     Mouth: Mucous membranes are moist.  Cardiovascular:     Rate and Rhythm: Normal rate and regular rhythm.  Pulmonary:     Effort: Pulmonary effort is normal.     Breath sounds: Normal breath sounds.  Skin:    General: Skin is warm.  Neurological:     Mental Status: She is alert.  Psychiatric:        Mood and Affect: Mood normal.     Imaging: MR BRAIN WO CONTRAST Result Date: 05/01/2024 CLINICAL DATA:  63 year old female code stroke presentation this morning. Dialysis patient. Severe atherosclerosis in the head and neck on CTA this month. EXAM: MRI HEAD WITHOUT CONTRAST LIMITED TECHNIQUE: Multiplanar, multiecho pulse sequences of the brain and surrounding structures were obtained without intravenous contrast. COMPARISON:  Plain head CT 0847 hours. CT head, CTA, CTP 04/23/24. FINDINGS: Neurology requests a study limited to DWI, sagittal T1 and axial FLAIR imaging. No restricted diffusion to suggest acute infarction. No midline shift, mass effect, evidence of mass lesion, ventriculomegaly, extra-axial collection or acute intracranial hemorrhage. Cervicomedullary junction and pituitary are within normal limits. Axial FLAIR imaging demonstrates scattered, generally small hyperintense foci in the bilateral cerebral white matter. The extent is mild to moderate for age. No cortical encephalomalacia identified. Incidental midline nasopharyngeal retention cysts (Tornwaldt cyst, normal variant). Negative for age visible cervical spine. IMPRESSION: Negative for acute infarct. No evidence of acute intracranial abnormality on limited exam. Electronically Signed   By: Marlise Simpers M.D.   On: 05/01/2024 10:26   CT  HEAD CODE STROKE WO CONTRAST Result Date: 05/01/2024 CLINICAL DATA:  Code stroke. Neuro deficit, concern for stroke, left arm weakness. EXAM: CT HEAD WITHOUT CONTRAST TECHNIQUE: Contiguous axial images were obtained from the base of the skull through the  vertex without intravenous contrast. RADIATION DOSE REDUCTION: This exam was performed according to the departmental dose-optimization program which includes automated exposure control, adjustment of the mA and/or kV according to patient size and/or use of iterative reconstruction technique. COMPARISON:  CT head and MRI head 04/29/2024. FINDINGS: Brain: No acute intracranial hemorrhage. No CT evidence of acute infarct. No edema, mass effect, or midline shift. The basilar cisterns are patent. Ventricles: The ventricles are normal. Vascular: Atherosclerotic calcifications of the carotid siphons and intracranial vertebral arteries. No hyperdense vessel. Skull: No acute or aggressive finding. Orbits: Right lens replacement.  Orbits otherwise unremarkable. Sinuses: Mild mucosal thickening in the left posterior ethmoid air cells. Postsurgical changes of the sphenoid sinuses. Other: Trace fluid in the right mastoid air cells. ASPECTS [facility] Stroke Program Early CT Score) - Ganglionic level infarction (caudate, lentiform nuclei, internal capsule, insula, M1-M3 cortex): 7 - Supraganglionic infarction (M4-M6 cortex): 3 Total score (0-10 with 10 being normal): 10 IMPRESSION: 1. No CT evidence of acute intracranial abnormality. 2. ASPECTS is 10 These results were communicated to Dr. Christiane Cowing at 9:06 am on 05/01/2024 by text page via the [facility] messaging system. Electronically Signed   By: Denny Flack M.D.   On: 05/01/2024 09:07   US  LT UPPER EXTREM LTD SOFT TISSUE NON VASCULAR Result Date: 04/30/2024 CLINICAL DATA:  Elbow pain and swelling.  Joint effusion. EXAM: ULTRASOUND LEFT UPPER EXTREMITY LIMITED TECHNIQUE: Ultrasound examination of the upper extremity soft tissues was performed in the area of clinical concern. COMPARISON:  Elbow radiographs 04/29/2024 and 04/30/2024. CT 04/30/2024. FINDINGS: Examination was targeted to the posterior aspect of the left elbow. There is generalized subcutaneous edema, as seen on CT. A focal  fluid collection posteriorly measures approximately 3.6 x 1.4 x 2.4 cm. This has a deep location adjacent to bone, and as correlated with same day CT likely represents the known elbow joint effusion. No other focal periarticular fluid collections are identified. IMPRESSION: Focal fluid collection posterior to the left elbow likely represents the known elbow joint effusion. No other focal periarticular fluid collections identified. If there is concern of septic arthritis, consider further evaluation with MRI with and without contrast or arthrocentesis. Electronically Signed   By: Elmon Hagedorn M.D.   On: 04/30/2024 21:44   CT ELBOW LEFT WO CONTRAST Result Date: 04/30/2024 CLINICAL DATA:  Bone lesion, elbow, incidental Elbow pain and swelling with joint effusion on radiographs. EXAM: CT OF THE UPPER LEFT EXTREMITY WITHOUT CONTRAST TECHNIQUE: Multidetector CT imaging of the left elbow was performed according to the standard protocol. RADIATION DOSE REDUCTION: This exam was performed according to the departmental dose-optimization program which includes automated exposure control, adjustment of the mA and/or kV according to patient size and/or use of iterative reconstruction technique. COMPARISON:  Elbow radiographs 04/29/2024 and 04/30/2024. Ultrasound 04/30/2024. FINDINGS: Bones/Joint/Cartilage No evidence of acute fracture, dislocation or bone destruction. Mild spurring of the coronoid process. There is a moderate-sized elbow joint effusion without evidence of erosive disease or intra-articular air. Ligaments Suboptimally assessed by CT. Muscles and Tendons No intramuscular fluid collection or focal atrophy identified on noncontrast imaging. As evaluated by CT, the biceps and triceps tendons appear intact. Soft tissues Dorsal subcutaneous edema within the distal upper arm, extending posterior to the elbow and into the  dorsal aspect of the proximal forearm. There may be a small amount of fluid within the  olecranon bursa. Small amount of air in the antecubital fossa without associated focal fluid collection, likely related to a peripheral IV or reported recent contrast extravasation. IMPRESSION: 1. Moderate-sized elbow joint effusion without evidence of erosive disease or intra-articular air. This is nonspecific and could be degenerative or inflammatory. If there is clinical concern for septic arthritis, consider arthrocentesis. 2. Dorsal subcutaneous edema within the distal upper arm, extending posterior to the elbow and into the dorsal aspect of the proximal forearm. No associated focal fluid collection. 3. Small amount of air in the antecubital fossa without associated focal fluid collection, likely related to a peripheral IV or reported recent contrast extravasation. 4. No acute osseous findings or evidence of osteomyelitis. Electronically Signed   By: Elmon Hagedorn M.D.   On: 04/30/2024 21:34   DG Elbow 2 Views Left Result Date: 04/30/2024 CLINICAL DATA:  Pain and swelling after an IV insertion. EXAM: LEFT ELBOW - 2 VIEW COMPARISON:  04/29/2024 FINDINGS: Soft tissue swelling over the olecranon. Mild subcutaneous edema in the posterior aspect of the distal upper arm. No acute osseous abnormality. IMPRESSION: 1. Edema over the olecranon may be related to extravasated contrast material or olecranon bursitis. 2. Subcutaneous edema in the dorsal aspect of the distal upper arm, compatible with recent contrast extravasation. Electronically Signed   By: Shearon Denis M.D.   On: 04/30/2024 19:27   VAS US  CAROTID Result Date: 04/30/2024 Carotid Arterial Duplex Study Patient Name:  Terry Arroyo  Date of Exam:   04/30/2024 Medical Rec #: [id number]        Accession #:    [id number] Date of Birth: September 16, 1961         Patient Gender: F Patient Age:   60 years Exam Location:  [facility] Procedure:      VAS US  CAROTID Referring Phys: Authur Leghorn MELVIN  --------------------------------------------------------------------------------  Indications:       CVA. Risk Factors:      Hypertension, hyperlipidemia, Diabetes. Other Factors:     Left ICA stent. Comparison Study:  04/25/2024 - Right Carotid: Velocities in the right ICA are                    consistent with an upper range 1-39% stenosis.                     Left Carotid: Left ICA stent is patent with elevated                    velocities suggestive                    of 50-74% stenosis.                     Vertebrals: Bilateral vertebral arteries demonstrate                    antegrade flow.                    Subclavians: Right subclavian artery was stenotic. Normal                    flow                    hemodynamics were seen in the left subclavian artery. Performing Technologist: Lerry Ransom RVT  Examination  Guidelines: A complete evaluation includes B-mode imaging, spectral Doppler, color Doppler, and power Doppler as needed of all accessible portions of each vessel. Bilateral testing is considered an integral part of a complete examination. Limited examinations for reoccurring indications may be performed as noted.  Right Carotid Findings: +----------+--------+--------+--------+--------------------------+--------+           PSV cm/sEDV cm/sStenosisPlaque Description        Comments +----------+--------+--------+--------+--------------------------+--------+ CCA Prox  123     13              smooth and heterogenous            +----------+--------+--------+--------+--------------------------+--------+ CCA Distal54      9               irregular and heterogenous         +----------+--------+--------+--------+--------------------------+--------+ ICA Prox  96      19              irregular and heterogenous         +----------+--------+--------+--------+--------------------------+--------+ ICA Mid   157     44      40-59%  irregular and heterogenous          +----------+--------+--------+--------+--------------------------+--------+ ICA Distal107     26                                        tortuous +----------+--------+--------+--------+--------------------------+--------+ ECA       95      1                                                  +----------+--------+--------+--------+--------------------------+--------+ +----------+--------+-------+--------+-------------------+           PSV cm/sEDV cmsDescribeArm Pressure (mmHG) +----------+--------+-------+--------+-------------------+ [id number]     106                                +----------+--------+-------+--------+-------------------+ +---------+--------+--+--------+--+---------+ VertebralPSV cm/s43EDV cm/s10Antegrade +---------+--------+--+--------+--+---------+ Elevated velocities are noted in the subclavian artery. Left Carotid Findings: +----------+--------+--------+--------+-----------------------+--------+           PSV cm/sEDV cm/sStenosisPlaque Description     Comments +----------+--------+--------+--------+-----------------------+--------+ CCA Prox  73      11              smooth and heterogenous         +----------+--------+--------+--------+-----------------------+--------+ CCA Distal66      12              smooth and heterogenous         +----------+--------+--------+--------+-----------------------+--------+ ICA Prox  76      17                                     Stent    +----------+--------+--------+--------+-----------------------+--------+ ICA Mid   112     31                                     Stent    +----------+--------+--------+--------+-----------------------+--------+ ICA Distal111     24  Stent    +----------+--------+--------+--------+-----------------------+--------+ ECA       63      10                                               +----------+--------+--------+--------+-----------------------+--------+ +----------+--------+--------+--------+-------------------+           PSV cm/sEDV cm/sDescribeArm Pressure (mmHG) +----------+--------+--------+--------+-------------------+ Subclavian131                                         +----------+--------+--------+--------+-------------------+ +---------+--------+--+--------+--+---------+ VertebralPSV cm/s66EDV cm/s17Antegrade +---------+--------+--+--------+--+---------+  Left Stent(s): +---------------+---+--++++ Prox to Stent  61 11 +---------------+---+--++++ Proximal Stent 76 17 +---------------+---+--++++ Mid Stent      11231 +---------------+---+--++++ Distal Stent   11124 +---------------+---+--++++ Distal to Stent54 20 +---------------+---+--++++    Summary: Right Carotid: Velocities in the right ICA are consistent with a 40-59%                stenosis. Left Carotid: Left ICA stent is patent with less than 50% stenosis by velocity. Vertebrals: Bilateral vertebral arteries demonstrate antegrade flow. *See table(s) above for measurements and observations.     Preliminary    DG Elbow 2 Views Left Result Date: 04/29/2024 CLINICAL DATA:  Pain elbow pain EXAM: LEFT ELBOW - 2 VIEW COMPARISON:  CTA chest, abdomen, and pelvis dated 04/23/2024 FINDINGS: Ill-defined density dispersed through the subcutaneous soft tissues of the partially imaged upper arm overlying the ulnar aspect of the humerus. No acute dislocation. Subtle cortical angulation at the level of the radial neck. Moderate elbow joint effusion. IMPRESSION: 1. Subtle cortical angulation at the level of the radial neck, which may represent a nondisplaced fracture in the appropriate clinical setting. 2. Moderate elbow joint effusion. 3. Ill-defined density dispersed through the subcutaneous soft tissues of the partially imaged upper arm overlying the ulnar aspect of the humerus, which  may represent extravasated contrast material. The patient underwent a contrast-enhanced CT examination on 04/23/2024 with contrast instillation via the left upper extremity. Electronically Signed   By: Limin  Xu M.D.   On: 04/29/2024 13:22   MR BRAIN WO CONTRAST Result Date: 04/29/2024 CLINICAL DATA:  Neuro deficit, acute, stroke suspected EXAM: MRI HEAD WITHOUT CONTRAST MRA HEAD WITHOUT CONTRAST MRA NECK WITHOUT CONTRAST TECHNIQUE: Multiplanar, multiecho pulse sequences of the brain and surrounding structures were obtained without intravenous contrast. Angiographic images of the Circle of Willis were obtained using MRA technique without intravenous contrast. Angiographic images of the neck were obtained using MRA technique without intravenous contrast. Carotid stenosis measurements (when applicable) are obtained utilizing NASCET criteria, using the distal internal carotid diameter as the denominator. COMPARISON:  CTA 04/23/2024, MRI head 12/07/2023 FINDINGS: MRI HEAD Motion artifact. Brain: There is no acute infarction or intracranial hemorrhage. There is no intracranial mass, mass effect, or edema. There is no hydrocephalus or extra-axial fluid collection. Ventricles and sulci are stable in size and configuration. Patchy foci of T2 hyperintensity in the supratentorial white matter are nonspecific but may reflect stable mild chronic microvascular ischemic changes. Vascular: Major vessel flow voids at the skull base are preserved. Skull and upper cervical spine: Normal marrow signal is preserved. Sinuses/Orbits: Postoperative changes with minimal patchy mucosal thickening. Right lens replacement. Other: Evidence of prior sellar mass resection minimal patchy mastoid fluid opacification. Retention cysts along the posterior  wall of the nasopharynx. MRA HEAD Motion artifact. Intracranial internal carotid arteries are patent. Atherosclerotic irregularity with stenosis. There is up to marked stenosis of the right  cavernous ICA and moderate marked stenosis of the left paraclinoid ICA, likely similar to prior CTA. Middle and anterior cerebral arteries are patent. 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 Intracranial vertebral arteries, basilar artery, posterior cerebral arteries are patent. Bilateral posterior communicating arteries are present. No significant stenosis. MRA NECK Motion artifact. Right common, internal, and external carotid arteries are patent. There is plaque without hemodynamically significant stenosis at the ICA origin. Subsequent circumferential narrowing of the proximal ICA causing greater than 70% stenosis. Likely no significant change from the prior CTA. Left common carotid artery is patent. There is loss of flow at the distal CCA and proximal ICA that may be related to interval stent placement. Mid to distal cervical ICA is patent. Vertebral arteries are patent. There is limited evaluation proximally due to artifact. No significant stenosis identified. IMPRESSION: Degraded by motion. No acute infarction, hemorrhage, or mass. No significant change since the prior brain MRI. Loss of flow related enhancement of the distal CCA and proximal ICA is probably related to artifact from interval stent. Greater than 70% stenosis of the proximal right ICA is probably similar to the prior CTA. Right greater than left intracranial ICA atherosclerosis and stenosis is likely similar to the prior CTA. 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 Electronically Signed   By: Geannie Keener M.D.   On: 04/29/2024 12:38   MR ANGIO HEAD WO CONTRAST Result Date: 04/29/2024 CLINICAL DATA:  Neuro deficit, acute, stroke suspected EXAM: MRI HEAD WITHOUT CONTRAST MRA HEAD WITHOUT CONTRAST MRA NECK WITHOUT CONTRAST TECHNIQUE: Multiplanar, multiecho pulse sequences of the brain and surrounding structures were obtained without intravenous contrast. Angiographic images of the Circle of Willis were  obtained using MRA technique without intravenous contrast. Angiographic images of the neck were obtained using MRA technique without intravenous contrast. Carotid stenosis measurements (when applicable) are obtained utilizing NASCET criteria, using the distal internal carotid diameter as the denominator. COMPARISON:  CTA 04/23/2024, MRI head 12/07/2023 FINDINGS: MRI HEAD Motion artifact. Brain: There is no acute infarction or intracranial hemorrhage. There is no intracranial mass, mass effect, or edema. There is no hydrocephalus or extra-axial fluid collection. Ventricles and sulci are stable in size and configuration. Patchy foci of T2 hyperintensity in the supratentorial white matter are nonspecific but may reflect stable mild chronic microvascular ischemic changes. Vascular: Major vessel flow voids at the skull base are preserved. Skull and upper cervical spine: Normal marrow signal is preserved. Sinuses/Orbits: Postoperative changes with minimal patchy mucosal thickening. Right lens replacement. Other: Evidence of prior sellar mass resection minimal patchy mastoid fluid opacification. Retention cysts along the posterior wall of the nasopharynx. MRA HEAD Motion artifact. Intracranial internal carotid arteries are patent. Atherosclerotic irregularity with stenosis. There is up to marked stenosis of the right cavernous ICA and moderate marked stenosis of the left paraclinoid ICA, likely similar to prior CTA. Middle and anterior cerebral arteries are patent. 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 Intracranial vertebral arteries, basilar artery, posterior cerebral arteries are patent. Bilateral posterior communicating arteries are present. No significant stenosis. MRA NECK Motion artifact. Right common, internal, and external carotid arteries are patent. There is plaque without hemodynamically significant stenosis at the ICA origin. Subsequent circumferential narrowing of the proximal ICA causing  greater than 70% stenosis. Likely no significant change from the prior CTA. Left common  carotid artery is patent. There is loss of flow at the distal CCA and proximal ICA that may be related to interval stent placement. Mid to distal cervical ICA is patent. Vertebral arteries are patent. There is limited evaluation proximally due to artifact. No significant stenosis identified. IMPRESSION: Degraded by motion. No acute infarction, hemorrhage, or mass. No significant change since the prior brain MRI. Loss of flow related enhancement of the distal CCA and proximal ICA is probably related to artifact from interval stent. Greater than 70% stenosis of the proximal right ICA is probably similar to the prior CTA. Right greater than left intracranial ICA atherosclerosis and stenosis is likely similar to the prior CTA. 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 Electronically Signed   By: Geannie Keener M.D.   On: 04/29/2024 12:38   MR ANGIO NECK WO CONTRAST Result Date: 04/29/2024 CLINICAL DATA:  Neuro deficit, acute, stroke suspected EXAM: MRI HEAD WITHOUT CONTRAST MRA HEAD WITHOUT CONTRAST MRA NECK WITHOUT CONTRAST TECHNIQUE: Multiplanar, multiecho pulse sequences of the brain and surrounding structures were obtained without intravenous contrast. Angiographic images of the Circle of Willis were obtained using MRA technique without intravenous contrast. Angiographic images of the neck were obtained using MRA technique without intravenous contrast. Carotid stenosis measurements (when applicable) are obtained utilizing NASCET criteria, using the distal internal carotid diameter as the denominator. COMPARISON:  CTA 04/23/2024, MRI head 12/07/2023 FINDINGS: MRI HEAD Motion artifact. Brain: There is no acute infarction or intracranial hemorrhage. There is no intracranial mass, mass effect, or edema. There is no hydrocephalus or extra-axial fluid collection. Ventricles and sulci are stable in size and  configuration. Patchy foci of T2 hyperintensity in the supratentorial white matter are nonspecific but may reflect stable mild chronic microvascular ischemic changes. Vascular: Major vessel flow voids at the skull base are preserved. Skull and upper cervical spine: Normal marrow signal is preserved. Sinuses/Orbits: Postoperative changes with minimal patchy mucosal thickening. Right lens replacement. Other: Evidence of prior sellar mass resection minimal patchy mastoid fluid opacification. Retention cysts along the posterior wall of the nasopharynx. MRA HEAD Motion artifact. Intracranial internal carotid arteries are patent. Atherosclerotic irregularity with stenosis. There is up to marked stenosis of the right cavernous ICA and moderate marked stenosis of the left paraclinoid ICA, likely similar to prior CTA. Middle and anterior cerebral arteries are patent. 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 Intracranial vertebral arteries, basilar artery, posterior cerebral arteries are patent. Bilateral posterior communicating arteries are present. No significant stenosis. MRA NECK Motion artifact. Right common, internal, and external carotid arteries are patent. There is plaque without hemodynamically significant stenosis at the ICA origin. Subsequent circumferential narrowing of the proximal ICA causing greater than 70% stenosis. Likely no significant change from the prior CTA. Left common carotid artery is patent. There is loss of flow at the distal CCA and proximal ICA that may be related to interval stent placement. Mid to distal cervical ICA is patent. Vertebral arteries are patent. There is limited evaluation proximally due to artifact. No significant stenosis identified. IMPRESSION: Degraded by motion. No acute infarction, hemorrhage, or mass. No significant change since the prior brain MRI. Loss of flow related enhancement of the distal CCA and proximal ICA is probably related to artifact from interval  stent. Greater than 70% stenosis of the proximal right ICA is probably similar to the prior CTA. Right greater than left intracranial ICA atherosclerosis and stenosis is likely similar to the prior CTA. Likely no significant change in left MCA  bifurcation aneurysm. Electronically Signed   By: Geannie Keener M.D.   On: 04/29/2024 12:38   CT HEAD CODE STROKE WO CONTRAST Result Date: 04/29/2024 CLINICAL DATA:  Code stroke.  Left-sided weakness EXAM: CT HEAD WITHOUT CONTRAST TECHNIQUE: Contiguous axial images were obtained from the base of the skull through the vertex without intravenous contrast. RADIATION DOSE REDUCTION: This exam was performed according to the departmental dose-optimization program which includes automated exposure control, adjustment of the mA and/or kV according to patient size and/or use of iterative reconstruction technique. COMPARISON:  Six days prior FINDINGS: Brain: No evidence of acute infarction, hemorrhage, hydrocephalus, extra-axial collection or mass lesion/mass effect. Extensive dural calcification Vascular: No hyperdense vessel. Scattered areas of calcification within the superior sagittal and left transverse sigmoid dural sinuses. Skull: Normal. Negative for fracture or focal lesion. Sinuses/Orbits: Chronic sinusitis with prior endoscopic sinus surgery Other: Prelim sent in epic chat ASPECTS [facility] Stroke Program Early CT Score) - Ganglionic level infarction (caudate, lentiform nuclei, internal capsule, insula, M1-M3 cortex): 7 - Supraganglionic infarction (M4-M6 cortex): 3 Total score (0-10 with 10 being normal): 10 IMPRESSION: No acute or interval finding. Electronically Signed   By: Ronnette Coke M.D.   On: 04/29/2024 11:08   ECHOCARDIOGRAM COMPLETE Result Date: 04/25/2024    ECHOCARDIOGRAM REPORT   Patient Name:   Terry Arroyo Date of Exam: 04/25/2024 Medical Rec #:  [id number]       Height:       66.0 in Accession #:    [id number]      Weight:       162.0 lb Date of  Birth:  Aug 10, 1961        BSA:          1.829 m Patient Age:    62 years        BP:           154/61 mmHg Patient Gender: F               HR:           63 bpm. Exam Location:  Inpatient Procedure: 2D Echo, Color Doppler and Cardiac Doppler (Both Spectral and Color            Flow Doppler were utilized during procedure). Indications:    Stroke  History:        Patient has prior history of Echocardiogram examinations, most                 recent 12/08/2023.  Sonographer:    Andrena Bang Referring Phys: Leandro Proffer DE LA TORRE IMPRESSIONS  1. Left ventricular ejection fraction, by estimation, is 35 to 40%. The left ventricle has moderately decreased function. The left ventricle demonstrates regional wall motion abnormalities (see scoring diagram/findings for description). Left ventricular  diastolic parameters are consistent with Grade II diastolic dysfunction (pseudonormalization).  2. Right ventricular systolic function is mildly reduced. The right ventricular size is moderately enlarged. There is moderately elevated pulmonary artery systolic pressure. The estimated right ventricular systolic pressure is 50.0 mmHg.  3. Left atrial size was moderately dilated.  4. Right atrial size was moderately dilated.  5. The mitral valve is normal in structure. Mild to moderate mitral valve regurgitation. No evidence of mitral stenosis. Moderate mitral annular calcification.  6. Tricuspid valve regurgitation is moderate.  7. The aortic valve is calcified. There is mild calcification of the aortic valve. There is mild thickening of the aortic valve. Aortic valve regurgitation is not visualized. No aortic  stenosis is present. Aortic valve mean gradient measures 6.0 mmHg. Aortic valve Vmax measures 1.81 m/s.  8. The inferior vena cava is dilated in size with >50% respiratory variability, suggesting right atrial pressure of 8 mmHg. Conclusion(s)/Recommendation(s): No intracardiac source of embolism detected on this transthoracic study.  Consider a transesophageal echocardiogram to exclude cardiac source of embolism if clinically indicated. FINDINGS  Left Ventricle: Left ventricular ejection fraction, by estimation, is 35 to 40%. The left ventricle has moderately decreased function. The left ventricle demonstrates regional wall motion abnormalities. The left ventricular internal cavity size was normal in size. There is no left ventricular hypertrophy. Left ventricular diastolic parameters are consistent with Grade II diastolic dysfunction (pseudonormalization).  LV Wall Scoring: The inferior wall and posterior wall are akinetic. Right Ventricle: The right ventricular size is moderately enlarged. No increase in right ventricular wall thickness. Right ventricular systolic function is mildly reduced. There is moderately elevated pulmonary artery systolic pressure. The tricuspid regurgitant velocity is 3.24 m/s, and with an assumed right atrial pressure of 8 mmHg, the estimated right ventricular systolic pressure is 50.0 mmHg. Left Atrium: Left atrial size was moderately dilated. Right Atrium: Right atrial size was moderately dilated. Pericardium: There is no evidence of pericardial effusion. Mitral Valve: The mitral valve is normal in structure. Moderate mitral annular calcification. Mild to moderate mitral valve regurgitation. No evidence of mitral valve stenosis. MV peak gradient, 9.0 mmHg. The mean mitral valve gradient is 3.0 mmHg. Tricuspid Valve: The tricuspid valve is normal in structure. Tricuspid valve regurgitation is moderate . No evidence of tricuspid stenosis. Aortic Valve: The aortic valve is calcified. There is mild calcification of the aortic valve. There is mild thickening of the aortic valve. Aortic valve regurgitation is not visualized. No aortic stenosis is present. Aortic valve mean gradient measures 6.0 mmHg. Aortic valve peak gradient measures 13.1 mmHg. Aortic valve area, by VTI measures 1.21 cm. Pulmonic Valve: The pulmonic  valve was normal in structure. Pulmonic valve regurgitation is not visualized. No evidence of pulmonic stenosis. Aorta: The aortic root is normal in size and structure. Venous: The inferior vena cava is dilated in size with greater than 50% respiratory variability, suggesting right atrial pressure of 8 mmHg. IAS/Shunts: No atrial level shunt detected by color flow Doppler.  LEFT VENTRICLE PLAX 2D LVIDd:         6.00 cm      Diastology LVIDs:         5.10 cm      LV e' medial:    4.24 cm/s LV PW:         0.85 cm      LV E/e' medial:  31.6 LV IVS:        1.15 cm      LV e' lateral:   7.18 cm/s LVOT diam:     1.70 cm      LV E/e' lateral: 18.7 LV SV:         48 LV SV Index:   26 LVOT Area:     2.27 cm  LV Volumes (MOD) LV vol d, MOD A2C: 152.0 ml LV vol d, MOD A4C: 143.0 ml LV vol s, MOD A2C: 82.1 ml LV vol s, MOD A4C: 92.2 ml LV SV MOD A2C:     69.9 ml LV SV MOD A4C:     143.0 ml LV SV MOD BP:      62.8 ml RIGHT VENTRICLE RV S prime:     6.31 cm/s TAPSE (M-mode): 1.1 cm  LEFT ATRIUM           Index LA diam:      4.00 cm 2.19 cm/m LA Vol (A2C): 78.2 ml 42.76 ml/m LA Vol (A4C): 44.5 ml 24.33 ml/m  AORTIC VALVE AV Area (Vmax):    1.14 cm AV Area (Vmean):   1.13 cm AV Area (VTI):     1.21 cm AV Vmax:           181.00 cm/s AV Vmean:          111.000 cm/s AV VTI:            0.401 m AV Peak Grad:      13.1 mmHg AV Mean Grad:      6.0 mmHg LVOT Vmax:         90.80 cm/s LVOT Vmean:        55.200 cm/s LVOT VTI:          0.213 m LVOT/AV VTI ratio: 0.53  AORTA Ao Asc diam: 3.10 cm MITRAL VALVE                TRICUSPID VALVE MV Area (PHT): 5.27 cm     TV Peak grad:   42.0 mmHg MV Area VTI:   1.18 cm     TV Vmax:        3.24 m/s MV Peak grad:  9.0 mmHg     TR Peak grad:   42.0 mmHg MV Mean grad:  3.0 mmHg     TR Vmax:        324.00 cm/s MV Vmax:       1.50 m/s MV Vmean:      75.5 cm/s    SHUNTS MV Decel Time: 144 msec     Systemic VTI:  0.21 m MV E velocity: 134.00 cm/s  Systemic Diam: 1.70 cm MV A velocity: 89.40 cm/s MV  E/A ratio:  1.50 Dorothye Gathers MD Electronically signed by Dorothye Gathers MD Signature Date/Time: 04/25/2024/3:19:00 PM    Final    VAS US  CAROTID Result Date: 04/25/2024 Carotid Arterial Duplex Study Patient Name:  Terry Arroyo  Date of Exam:   04/25/2024 Medical Rec #: [id number]        Accession #:    [id number] Date of Birth: 1961/09/08         Patient Gender: F Patient Age:   44 years Exam Location:  [facility] Procedure:      VAS US  CAROTID Referring Phys: Margart Shears DE LA TORRE --------------------------------------------------------------------------------  Indications:       CVA. Risk Factors:      Hypertension, hyperlipidemia, prior MI, coronary artery                    disease, prior CVA, PAD. Comparison Study:  04/23/2024 CTA neck- 1. Negative for large vessel occlusion,                    But Positive for VERY SEVERE underlying atherosclerosis with:                    - LICA origin and bilateral ICA siphon RADIOGRAPHIC STRING                    SIGN STENOSES.                    - Right ICA distal bulb high-grade stenosis, estimated 70-80%  and                    approaching a radiographic string sign. Performing Technologist: Delford Felling MHA, RDMS, RVT, RDCS  Examination Guidelines: A complete evaluation includes B-mode imaging, spectral Doppler, color Doppler, and power Doppler as needed of all accessible portions of each vessel. Bilateral testing is considered an integral part of a complete examination. Limited examinations for reoccurring indications may be performed as noted.  Right Carotid Findings: +----------+--------+--------+--------+-----------------------+--------+           PSV cm/sEDV cm/sStenosisPlaque Description     Comments +----------+--------+--------+--------+-----------------------+--------+ CCA Prox  123     14                                              +----------+--------+--------+--------+-----------------------+--------+ CCA Distal79       8               heterogenous and smooth         +----------+--------+--------+--------+-----------------------+--------+ ICA Prox  170     36      1-39%   smooth and heterogenous         +----------+--------+--------+--------+-----------------------+--------+ ICA Mid   126     24                                              +----------+--------+--------+--------+-----------------------+--------+ ICA Distal70      12                                              +----------+--------+--------+--------+-----------------------+--------+ ECA       116                                                     +----------+--------+--------+--------+-----------------------+--------+ +----------+--------+-------+--------+--------------------+           PSV cm/sEDV cmsDescribeArm Pressure (mmHG)  +----------+--------+-------+--------+--------------------+ [id number]            StenoticRestricted extremity +----------+--------+-------+--------+--------------------+ +---------+--------+--+--------+-+---------+ VertebralPSV cm/s34EDV cm/s4Antegrade +---------+--------+--+--------+-+---------+  Left Carotid Findings: +----------+--------+--------+--------+------------------+--------+           PSV cm/sEDV cm/sStenosisPlaque DescriptionComments +----------+--------+--------+--------+------------------+--------+ CCA Prox  77      9                                          +----------+--------+--------+--------+------------------+--------+ CCA Distal                                          stent    +----------+--------+--------+--------+------------------+--------+ ICA Prox                                            stent    +----------+--------+--------+--------+------------------+--------+  ICA Mid                                             stent    +----------+--------+--------+--------+------------------+--------+ ICA Distal104     24                                          +----------+--------+--------+--------+------------------+--------+ ECA       287     13                                         +----------+--------+--------+--------+------------------+--------+ +----------+--------+--------+----------------+-------------------+           PSV cm/sEDV cm/sDescribe        Arm Pressure (mmHG) +----------+--------+--------+----------------+-------------------+ [id number]              Multiphasic, [id number]                 +----------+--------+--------+----------------+-------------------+ +---------+--------+---+--------+--+---------+ VertebralPSV cm/s101EDV cm/s23Antegrade +---------+--------+---+--------+--+---------+  Left Stent(s): +---------------+--------+--------+--------+--------+--------+ ICA            PSV cm/sEDV cm/sStenosisWaveformComments +---------------+--------+--------+--------+--------+--------+ Prox to Stent  96      17                               +---------------+--------+--------+--------+--------+--------+ Proximal Stent 82      12                               +---------------+--------+--------+--------+--------+--------+ Mid Stent      227     27                               +---------------+--------+--------+--------+--------+--------+ Distal Stent   101     24                               +---------------+--------+--------+--------+--------+--------+ Distal to Stent104     24                               +---------------+--------+--------+--------+--------+--------+    Summary: Right Carotid: Velocities in the right ICA are consistent with an upper range                1-39% stenosis. Left Carotid: Left ICA stent is patent with elevated velocities suggestive of               50-74% stenosis. Vertebrals:  Bilateral vertebral arteries demonstrate antegrade flow. Subclavians: Right subclavian artery was stenotic. Normal flow hemodynamics were               seen in the left subclavian artery. *See table(s) above for measurements and observations.     Preliminary    EEG adult Result Date: 04/25/2024 Arleene Lack, MD     04/25/2024 10:18 AM Patient Name: Terry Arroyo MRN: [id number] Epilepsy Attending: Arleene Lack Referring Physician/Provider: Colon Dear, NP Date: 04/25/2024 Duration: 25.36 mins Patient  history: 63 year old female with acute ischemic stroke getting EEG to evaluate for seizure. Level of alertness: Awake AEDs during EEG study: GBP, TPM Technical aspects: This EEG study was done with scalp electrodes positioned according to the 10-20 International system of electrode placement. Electrical activity was reviewed with band pass filter of 1-70Hz , sensitivity of 7 uV/mm, display speed of 78mm/sec with a 60Hz  notched filter applied as appropriate. EEG data were recorded continuously and digitally stored.  Video monitoring was available and reviewed as appropriate. Description: The posterior dominant rhythm consists of 8 Hz activity of moderate voltage (25-35 uV) seen predominantly in posterior head regions, symmetric and reactive to eye opening and eye closing. Hyperventilation and photic stimulation were not performed.   IMPRESSION: This study is within normal limits. No seizures or epileptiform discharges were seen throughout the recording. A normal interictal EEG does not exclude the diagnosis of epilepsy. Arleene Lack   MR BRAIN WO CONTRAST Result Date: 04/24/2024 CLINICAL DATA:  Stroke follow-up EXAM: MRI HEAD WITHOUT CONTRAST TECHNIQUE: Multiplanar, multiecho pulse sequences of the brain and surrounding structures were obtained without intravenous contrast. COMPARISON:  12/07/2023 FINDINGS: Brain: No acute infarction, hemorrhage, hydrocephalus, extra-axial collection or mass lesion. Chronic generalized dural thickening with calcification by CT. Vascular: Diffusely attenuated superior sagittal sinus with absent flow void  continuing into the left sigmoid sinus, chronic partial thrombosis by prior enhanced imaging with prominent venous collaterals along the right inferior convexity. Chronic thrombus associated with calcifications. Skull and upper cervical spine: Diffusely hypointense marrow signal. No focal lesion seen. Sinuses/Orbits: Prior endoscopic sinus surgery. Right proptosis not associated with acute inflammation, stable from 2024. Nasopharyngeal retention cysts. IMPRESSION: No acute finding.  Negative for acute infarct. Chronic dural venous sinus thrombosis with collaterals. Electronically Signed   By: Ronnette Coke M.D.   On: 04/24/2024 06:03   IR PERCUTANEOUS ART THROMBECTOMY/INFUSION INTRACRANIAL INC DIAG ANGIO Result Date: 04/23/2024 PROCEDURE PERFORMED: 1. Stroke thrombectomy 2. Ultrasound vascular access 3. Cone beam CT for treatment planning COMPARISON:  CT perfusion performed Apr 23, 2024. CLINICAL DATA:  63 year old female with acute ischemic stroke with symptoms of right motor dysfunction sensory deficits and dysarthria with an NIH stroke scale measuring 14. CT imaging demonstrated a severe proximal left internal carotid artery stenosis and perfusion imaging suggested hypoperfusion to the left hemisphere. After discussing the case in a multidisciplinary fashion, and confirming that there was no immediate available decision maker, the patient was transferred for thrombectomy and emergent 2 physician consent was documented. INDICATION: Acute ischemic ischemic stroke with left hemi severe hypoperfusion. ANESTHESIA/SEDATION: General anesthesia was utilized for the procedure. CONTRAST:  Approximately 75 cc Omnipaque  300 320 MEDICATIONS: Heparin  6000 units intravenously Brilinta  180 mg per tube FLUOROSCOPY TIME:  Fluoroscopy Time: 15.8 minutes (737 mGy). COMPLICATIONS: None immediate. BODY OF REPORT: The patient was then put under general anesthesia by the Department of Anesthesiology at [facility]. Insert  ultrasound access The right groin was prepped and draped in the usual sterile fashion. Ultrasound was used to study the right common femoral artery which was patent. Using real-time ultrasound guidance, a 19 gauge introducer needle was used to access the right common femoral artery. Access was performed at 1357. A hard copy image ultrasound the same date uncertain PACS. Using this access, a 6 French sheath was placed in the descending thoracic aorta. Approximately 5000 units of heparin  was administered intravenously. Next, selective catheterization the left common carotid artery was performed. A selective arteriogram was performed which demonstrated that the internal carotid  artery was patent. But demonstrated a severe focal stenosis estimated at greater than 80% in the single frontal plane of imaging. This was use a lies as it best profiled the bifurcation between the internal carotid artery and external carotid artery. There was no evidence of intracranial filling defect. Scattered mild diffuse atherosclerotic changes are present. Next, the lesion was crossed with a catheter and guidewire. At this point, my tempted to deployed embolic protection device, however, I could not safely deployed the device in the proximal internal carotid artery secondary to tortuosity and concern for device detachment. Therefore, the proximal stenosis was treated with balloon angioplasty using a 4 mm noncompliant balloon. This was considered the first pass for the purposes of documentation and this was performed at 14:20. Next, a precise 6 mm x 40 mm self expanding stent was deployed in the proximal left internal carotid artery extending into the common carotid artery. Deployment was completed at 14:50 and this was considered recanalization. There was a residual waist and therefore stent dilation was performed using a 5 mm noncompliant balloon. A post treatment arteriogram demonstrated no evidence of significant residual proximal  stenosis. There was good apposition of the bare metal stent. There was persistent filling of the left external carotid artery. The intracranial arteries were improved in terms of caliber. There was no evidence of filling defect to suggest peripheral embolus. I elected to terminate the procedure at this point. The revascularization score was graded at TICI-3 Evaluation of the right femoral access site demonstrated at this site was suitable for closure device. A 6 French Angio-Seal device was deployed without complication. Become beam CT was then performed to evaluate for intracranial hemorrhage and treatment planning. This demonstrated no evidence significant acute intracranial hemorrhage. Fluoroscopic imaging confirmed placement of an enteric tube within the stomach. At this point, 180 mg of Brilinta  was administered per tube. The patient was then transferred to the ICU in stable condition while remaining intubated. IMPRESSION: 1. Emergent left internal carotid artery stent placement due to clinical concern for is global left-sided hypoperfusion and developing stroke in the setting of severe left internal carotid artery stenosis. PLAN: 1. To ICU for routine postoperative supportive care. 2. The patient will continue on a heparin  infusion intravenously at 500 units/hour for the next 6 hours. At that point, heparin  will be discontinued. 3. The patient has received a bolus dose of aspirin  PR and a bolus dose of 180 mg of Brilinta  per tube. Electronically Signed   By: Reagan Camera M.D.   On: 04/23/2024 16:31   IR US  Guide Vasc Access Right Result Date: 04/23/2024 PROCEDURE PERFORMED: 1. Stroke thrombectomy 2. Ultrasound vascular access 3. Cone beam CT for treatment planning COMPARISON:  CT perfusion performed Apr 23, 2024. CLINICAL DATA:  63 year old female with acute ischemic stroke with symptoms of right motor dysfunction sensory deficits and dysarthria with an NIH stroke scale measuring 14. CT imaging demonstrated a  severe proximal left internal carotid artery stenosis and perfusion imaging suggested hypoperfusion to the left hemisphere. After discussing the case in a multidisciplinary fashion, and confirming that there was no immediate available decision maker, the patient was transferred for thrombectomy and emergent 2 physician consent was documented. INDICATION: Acute ischemic ischemic stroke with left hemi severe hypoperfusion. ANESTHESIA/SEDATION: General anesthesia was utilized for the procedure. CONTRAST:  Approximately 75 cc Omnipaque  300 320 MEDICATIONS: Heparin  6000 units intravenously Brilinta  180 mg per tube FLUOROSCOPY TIME:  Fluoroscopy Time: 15.8 minutes (737 mGy). COMPLICATIONS: None immediate. BODY OF REPORT: The  patient was then put under general anesthesia by the Department of Anesthesiology at [facility]. Insert ultrasound access The right groin was prepped and draped in the usual sterile fashion. Ultrasound was used to study the right common femoral artery which was patent. Using real-time ultrasound guidance, a 19 gauge introducer needle was used to access the right common femoral artery. Access was performed at 1357. A hard copy image ultrasound the same date uncertain PACS. Using this access, a 6 French sheath was placed in the descending thoracic aorta. Approximately 5000 units of heparin  was administered intravenously. Next, selective catheterization the left common carotid artery was performed. A selective arteriogram was performed which demonstrated that the internal carotid artery was patent. But demonstrated a severe focal stenosis estimated at greater than 80% in the single frontal plane of imaging. This was use a lies as it best profiled the bifurcation between the internal carotid artery and external carotid artery. There was no evidence of intracranial filling defect. Scattered mild diffuse atherosclerotic changes are present. Next, the lesion was crossed with a catheter and  guidewire. At this point, my tempted to deployed embolic protection device, however, I could not safely deployed the device in the proximal internal carotid artery secondary to tortuosity and concern for device detachment. Therefore, the proximal stenosis was treated with balloon angioplasty using a 4 mm noncompliant balloon. This was considered the first pass for the purposes of documentation and this was performed at 14:20. Next, a precise 6 mm x 40 mm self expanding stent was deployed in the proximal left internal carotid artery extending into the common carotid artery. Deployment was completed at 14:50 and this was considered recanalization. There was a residual waist and therefore stent dilation was performed using a 5 mm noncompliant balloon. A post treatment arteriogram demonstrated no evidence of significant residual proximal stenosis. There was good apposition of the bare metal stent. There was persistent filling of the left external carotid artery. The intracranial arteries were improved in terms of caliber. There was no evidence of filling defect to suggest peripheral embolus. I elected to terminate the procedure at this point. The revascularization score was graded at TICI-3 Evaluation of the right femoral access site demonstrated at this site was suitable for closure device. A 6 French Angio-Seal device was deployed without complication. Become beam CT was then performed to evaluate for intracranial hemorrhage and treatment planning. This demonstrated no evidence significant acute intracranial hemorrhage. Fluoroscopic imaging confirmed placement of an enteric tube within the stomach. At this point, 180 mg of Brilinta  was administered per tube. The patient was then transferred to the ICU in stable condition while remaining intubated. IMPRESSION: 1. Emergent left internal carotid artery stent placement due to clinical concern for is global left-sided hypoperfusion and developing stroke in the setting of  severe left internal carotid artery stenosis. PLAN: 1. To ICU for routine postoperative supportive care. 2. The patient will continue on a heparin  infusion intravenously at 500 units/hour for the next 6 hours. At that point, heparin  will be discontinued. 3. The patient has received a bolus dose of aspirin  PR and a bolus dose of 180 mg of Brilinta  per tube. Electronically Signed   By: Reagan Camera M.D.   On: 04/23/2024 16:31   IR CT Head Ltd Result Date: 04/23/2024 PROCEDURE PERFORMED: 1. Stroke thrombectomy 2. Ultrasound vascular access 3. Cone beam CT for treatment planning COMPARISON:  CT perfusion performed Apr 23, 2024. CLINICAL DATA:  63 year old female with acute ischemic stroke with symptoms  of right motor dysfunction sensory deficits and dysarthria with an NIH stroke scale measuring 14. CT imaging demonstrated a severe proximal left internal carotid artery stenosis and perfusion imaging suggested hypoperfusion to the left hemisphere. After discussing the case in a multidisciplinary fashion, and confirming that there was no immediate available decision maker, the patient was transferred for thrombectomy and emergent 2 physician consent was documented. INDICATION: Acute ischemic ischemic stroke with left hemi severe hypoperfusion. ANESTHESIA/SEDATION: General anesthesia was utilized for the procedure. CONTRAST:  Approximately 75 cc Omnipaque  300 320 MEDICATIONS: Heparin  6000 units intravenously Brilinta  180 mg per tube FLUOROSCOPY TIME:  Fluoroscopy Time: 15.8 minutes (737 mGy). COMPLICATIONS: None immediate. BODY OF REPORT: The patient was then put under general anesthesia by the Department of Anesthesiology at [facility]. Insert ultrasound access The right groin was prepped and draped in the usual sterile fashion. Ultrasound was used to study the right common femoral artery which was patent. Using real-time ultrasound guidance, a 19 gauge introducer needle was used to access the right common  femoral artery. Access was performed at 1357. A hard copy image ultrasound the same date uncertain PACS. Using this access, a 6 French sheath was placed in the descending thoracic aorta. Approximately 5000 units of heparin  was administered intravenously. Next, selective catheterization the left common carotid artery was performed. A selective arteriogram was performed which demonstrated that the internal carotid artery was patent. But demonstrated a severe focal stenosis estimated at greater than 80% in the single frontal plane of imaging. This was use a lies as it best profiled the bifurcation between the internal carotid artery and external carotid artery. There was no evidence of intracranial filling defect. Scattered mild diffuse atherosclerotic changes are present. Next, the lesion was crossed with a catheter and guidewire. At this point, my tempted to deployed embolic protection device, however, I could not safely deployed the device in the proximal internal carotid artery secondary to tortuosity and concern for device detachment. Therefore, the proximal stenosis was treated with balloon angioplasty using a 4 mm noncompliant balloon. This was considered the first pass for the purposes of documentation and this was performed at 14:20. Next, a precise 6 mm x 40 mm self expanding stent was deployed in the proximal left internal carotid artery extending into the common carotid artery. Deployment was completed at 14:50 and this was considered recanalization. There was a residual waist and therefore stent dilation was performed using a 5 mm noncompliant balloon. A post treatment arteriogram demonstrated no evidence of significant residual proximal stenosis. There was good apposition of the bare metal stent. There was persistent filling of the left external carotid artery. The intracranial arteries were improved in terms of caliber. There was no evidence of filling defect to suggest peripheral embolus. I elected to  terminate the procedure at this point. The revascularization score was graded at TICI-3 Evaluation of the right femoral access site demonstrated at this site was suitable for closure device. A 6 French Angio-Seal device was deployed without complication. Become beam CT was then performed to evaluate for intracranial hemorrhage and treatment planning. This demonstrated no evidence significant acute intracranial hemorrhage. Fluoroscopic imaging confirmed placement of an enteric tube within the stomach. At this point, 180 mg of Brilinta  was administered per tube. The patient was then transferred to the ICU in stable condition while remaining intubated. IMPRESSION: 1. Emergent left internal carotid artery stent placement due to clinical concern for is global left-sided hypoperfusion and developing stroke in the setting of severe left  internal carotid artery stenosis. PLAN: 1. To ICU for routine postoperative supportive care. 2. The patient will continue on a heparin  infusion intravenously at 500 units/hour for the next 6 hours. At that point, heparin  will be discontinued. 3. The patient has received a bolus dose of aspirin  PR and a bolus dose of 180 mg of Brilinta  per tube. Electronically Signed   By: Reagan Camera M.D.   On: 04/23/2024 16:31   CT CEREBRAL PERFUSION W CONTRAST Result Date: 04/23/2024 CLINICAL DATA:  63 year old female code stroke presentation, profound unilateral lower extremity weakness. EXAM: CT PERFUSION BRAIN TECHNIQUE: Multiphase CT imaging of the brain was performed following IV bolus contrast injection. Subsequent parametric perfusion maps were calculated using RAPID software. RADIATION DOSE REDUCTION: This exam was performed according to the departmental dose-optimization program which includes automated exposure control, adjustment of the mA and/or kV according to patient size and/or use of iterative reconstruction technique. CONTRAST:  50mL OMNIPAQUE  IOHEXOL  350 MG/ML SOLN COMPARISON:  CTA  1207 hours today. FINDINGS: FINDINGS ASPECTS: 10 CBF (<30%) Volume: 0mL. No CBF ir CBV parameter abnormality detected. Perfusion (Tmax>6.0s) volume: 0mL however, 88 mL of T-max greater than 4s is detected which is mostly (although not entirely) in the left hemisphere. Mismatch Volume: Not applicablemL Infarction Location:Not applicable IMPRESSION: 1. NO infarct core or penumbra detected by standard CTP parameters. 2. Suggestion of asymmetric oligemia in the left hemisphere (T-max > 4s), which might relate to the severe ICA stenoses described on CTA today. Electronically Signed   By: Marlise Simpers M.D.   On: 04/23/2024 13:04   CT Angio Chest/Abd/Pel for Dissection W and/or W/WO Result Date: 04/23/2024 CLINICAL DATA:  Weakness and back pain. Acute aortic syndrome suspected. EXAM: CT ANGIOGRAPHY CHEST, ABDOMEN AND PELVIS TECHNIQUE: Non-contrast CT of the chest was initially obtained. Multidetector CT imaging through the chest, abdomen and pelvis was performed using the standard protocol during bolus administration of intravenous contrast. Multiplanar reconstructed images and MIPs were obtained and reviewed to evaluate the vascular anatomy. RADIATION DOSE REDUCTION: This exam was performed according to the departmental dose-optimization program which includes automated exposure control, adjustment of the mA and/or kV according to patient size and/or use of iterative reconstruction technique. CONTRAST:  OMNIPAQUE  IOHEXOL  350 MG/ML SOLN COMPARISON:  CT abdomen pelvis dated 02/09/2022. FINDINGS: Evaluation is limited due to streak artifact caused by patient's arms and overlying support wires. CTA CHEST FINDINGS Cardiovascular: Mild cardiomegaly. No pericardial effusion. There is mild atherosclerotic calcification of the thoracic aorta. No aneurysmal dilatation or dissection. The origins of the great vessels of the aortic arch appear patent. No pulmonary artery embolus identified. Mediastinum/Nodes: No hilar or  mediastinal adenopathy. There is circumferential thickening of the mid esophagus concerning for esophagitis. Clinical correlation is recommended. An infiltrative process is not excluded. No mediastinal fluid collection. Lungs/Pleura: There are minimal bibasilar linear atelectasis. No focal consolidation, pleural effusion, or pneumothorax. The central airways are patent. Musculoskeletal: Median sternotomy wires. No acute osseous pathology. Review of the MIP images confirms the above findings. CTA ABDOMEN AND PELVIS FINDINGS VASCULAR Aorta: Advanced atherosclerotic calcification of the abdominal aorta. No aneurysmal dilatation or dissection. No periaortic fluid collection. Celiac: Atherosclerotic calcification of the origin of the celiac artery. The celiac artery and its major branches are patent. SMA: The SMA is patent. Renals: There is atherosclerotic calcification of the origins of the renal arteries with luminal narrowing. There is diminished flow in the renal arteries bilaterally. The renal arteries however remain patent. IMA: Atherosclerotic calcification of the IMA  with diminished flow. Focal area of high-grade narrowing of the proximal IMA with reconstitution of flow distal to this segment. Inflow: Advanced atherosclerotic calcification of the iliac arteries. The iliac arteries are patent. Veins: The IVC is unremarkable.  No portal venous gas. Review of the MIP images confirms the above findings. NON-VASCULAR No intra-abdominal free air.  Small perihepatic ascites. Hepatobiliary: The liver is unremarkable. No biliary dilatation. The gallbladder is unremarkable. Pancreas: Unremarkable. No pancreatic ductal dilatation or surrounding inflammatory changes. Spleen: Normal in size without focal abnormality. Adrenals/Urinary Tract: The adrenal glands are unremarkable. Moderate bilateral renal parenchyma atrophy. No hydronephrosis. There is stranding of the perinephric and renal sinus fat. Correlation with urinalysis  recommended to exclude UTI. The urinary bladder is collapsed. Stomach/Bowel: Mild sigmoid diverticulosis. There is no bowel obstruction or active inflammation. The appendix is normal. Lymphatic: No adenopathy. Reproductive: The uterus is grossly unremarkable. No suspicious adnexal masses. Other: None Musculoskeletal: No acute or significant osseous findings. Review of the MIP images confirms the above findings. IMPRESSION: 1. No aortic aneurysm or dissection. 2. Circumferential thickening of the mid esophagus concerning for esophagitis. Clinical correlation is recommended. 3. Mild sigmoid diverticulosis. No bowel obstruction. Normal appendix. 4. Moderate bilateral renal parenchyma atrophy. No hydronephrosis. Correlation with urinalysis recommended to exclude UTI. Electronically Signed   By: Angus Bark M.D.   On: 04/23/2024 12:44   CT ANGIO HEAD NECK W WO CM (CODE STROKE) Result Date: 04/23/2024 CLINICAL DATA:  63 year old female code stroke presentation. Possible left MCA syndrome. Dialysis patient. EXAM: CT ANGIOGRAPHY HEAD AND NECK WITH AND WITHOUT CONTRAST TECHNIQUE: Multidetector CT imaging of the head and neck was performed using the standard protocol during bolus administration of intravenous contrast. Multiplanar CT image reconstructions and MIPs were obtained to evaluate the vascular anatomy. Carotid stenosis measurements (when applicable) are obtained utilizing NASCET criteria, using the distal internal carotid diameter as the denominator. RADIATION DOSE REDUCTION: This exam was performed according to the departmental dose-optimization program which includes automated exposure control, adjustment of the mA and/or kV according to patient size and/or use of iterative reconstruction technique. CONTRAST:  50mL OMNIPAQUE  IOHEXOL  350 MG/ML SOLN COMPARISON:  Head CT 1158 hours today. FINDINGS: CTA NECK Skeleton: Reversal of the normal cervical lordosis. Previous sternotomy. Mostly absent dentition. Upper  thoracic spina bifida occulta. No acute osseous abnormality identified. Upper chest: Previous CABG. Other neck: Partially retropharyngeal carotids. Nonvascular neck soft tissue spaces are within normal limits. Aortic arch: Calcified aortic atherosclerosis. Bovine arch configuration. Right carotid system: Brachiocephalic artery and proximal right CCA are patent with mild plaque and no stenosis. Tortuous, partially retropharyngeal right CCA. Calcified plaque at the right ICA origin without significant stenosis. Soft and calcified plaque at the bulb or just distal to the bulb with high-grade stenosis (series 2, image 94 and series 6, image 68) approaching a radiographic string sign, numerically estimated at 70-80 % with respect to the distal vessel. The vessel remains patent to the skull base. Left carotid system: Left CCA origin plaque with less than 50% stenosis. Tortuous left CCA. Soft and calcified plaque before the bifurcation. Severe plaque at the left carotid bifurcation with radiographic string sign stenosis series 5, image 133. But the vessel remains patent. Vertebral arteries: Right subclavian origin calcified plaque without stenosis. Diminutive and atherosclerotic right vertebral artery origin and V1 segment with at least moderate stenosis. Intermittent right V2 segment plaque, but the right vertebral artery caliber improves to the skull base with no additional significant stenosis. Left subclavian origin plaque without stenosis.  Calcified plaque at the left vertebral artery origin with similar mild to moderate stenosis. Codominant left vertebral artery with intermittent V2 and V3 segment plaque but no other significant stenosis to the skull base. CTA HEAD Posterior circulation: Atherosclerotic distal vertebral arteries and vertebrobasilar junction. Dominant left V4. PICA origins remain patent. Mild distal vertebral and vertebrobasilar stenosis. Patent basilar artery without stenosis. Patent SCA and PCA  origins. Small posterior communicating arteries. Bilateral PCA branches are within normal limits. Anterior circulation:  Both ICA siphons are patent. Severe calcified atherosclerosis and stenosis of the cavernous left ICA on series 6, image 102, string sign stenosis. Moderate additional left supraclinoid plaque. The vessel remains patent. Left posterior communicating artery origin is normal. Similar severe calcified right siphon plaque and stenosis. Distal right ICA remains patent with moderate additional supraclinoid plaque and stenosis. Patent carotid termini, MCA and ACA origins. Normal anterior communicating artery. Bilateral ACA branches are within normal limits. Right MCA M1 segment and bifurcation are patent without stenosis. No right MCA branch occlusion is identified. Left MCA M1 segment is patent without stenosis. There is a saccular 2-3 mm left MCA bifurcation aneurysm on series 6, image 112, directed anteriorly and laterally. Left MCA bifurcation remains patent. No left MCA branch occlusion is identified. Venous sinuses: Not evaluated due to early contrast timing. Anatomic variants: Dominant left vertebral artery. Review of the MIP images confirms the above findings IMPRESSION: 1. Negative for large vessel occlusion, But Positive for VERY SEVERE underlying atherosclerosis with: - LICA origin and bilateral ICA siphon RADIOGRAPHIC STRING SIGN STENOSES. - Right ICA distal bulb high-grade stenosis, estimated 70-80% and approaching a radiographic string sign. 2. 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 3. Less pronounced posterior circulation, bilateral vertebral artery atherosclerosis but still evidence of up to moderate bilateral vertebral artery stenoses. 4.  Aortic Atherosclerosis (ICD10-I70.0). Salient findings discussed by telephone with Dr. Doretta Gant on 04/23/2024 at 12:25 . Electronically Signed   By: Marlise Simpers M.D.   On: 04/23/2024 12:27   CT HEAD CODE STROKE WO CONTRAST Result  Date: 04/23/2024 CLINICAL DATA:  Code stroke. 63 year old female. Dialysis patient. Pain. EXAM: CT HEAD WITHOUT CONTRAST TECHNIQUE: Contiguous axial images were obtained from the base of the skull through the vertex without intravenous contrast. RADIATION DOSE REDUCTION: This exam was performed according to the departmental dose-optimization program which includes automated exposure control, adjustment of the mA and/or kV according to patient size and/or use of iterative reconstruction technique. COMPARISON:  Head CT,Brain MRI 2024/01/05. FINDINGS: Brain: Pronounced chronic dural calcification, stable. Stable cerebral volume. No midline shift, ventriculomegaly, mass effect, evidence of mass lesion, intracranial hemorrhage or evidence of cortically based acute infarction. Stable gray-white matter differentiation throughout the brain, relatively normal for age. Vascular: Chronic severe calcification. Skull: Stable visualized osseous structures. Sinuses/Orbits: Severe chronic paranasal sinusitis with mucoperiosteal thickening, chronically eroded posterior septum and sphenoid septum. Stable appearance since 01-04-2025. Tympanic cavities and mastoids remain well aerated. Other: No acute orbit or scalp soft tissue finding. ASPECTS [facility] Stroke Program Early CT Score) Total score (0-10 with 10 being normal): 10 IMPRESSION: 1. Stable noncontrast CT appearance of the brain since January 04, 2025, remarkable for unusually advanced dural calcification. ASPECTS 10. No acute intracranial hemorrhage. 2. Severe chronic paranasal sinus disease. 3. These results were communicated to Dr. Doretta Gant at 12:11 pm on 04/23/2024 by text page via the [facility] messaging system. Electronically Signed   By: Marlise Simpers M.D.   On: 04/23/2024 12:12   PERIPHERAL VASCULAR CATHETERIZATION Result Date:  04/17/2024 See surgical note for result.   Labs:  CBC: Recent Labs    04/26/24 0959 04/27/24 1758 04/29/24 1255 04/29/24 1302 04/30/24 0700  WBC 8.2 7.8  10.0  --  9.4  HGB 10.3* 10.8* 10.2* 10.5* 10.1*  HCT 31.3* 33.2* 33.2* 31.0* 31.3*  PLT 190 240 267  --  278    COAGS: Recent Labs    04/23/24 1230 04/24/24 0616 04/29/24 1255 04/30/24 2250  INR 1.3* 1.1 1.1 1.1  APTT 33  --  32  --     BMP: Recent Labs    04/27/24 1758 04/29/24 1255 04/29/24 1302 04/30/24 0700 05/01/24 0612  NA 135 135 134* 133* 133*  K 4.2 4.6 4.6 4.9 4.4  CL 95* 96* 99 94* 93*  CO2 25 24  --  23 23  GLUCOSE 139* 160* 159* 205* 174*  BUN 36* 56* 51* 67* 37*  CALCIUM  8.9 8.9  --  8.6* 9.1  CREATININE 5.83* 8.05* 8.10* 9.54* 6.61*  GFRNONAA 8* 5*  --  4* 7*    LIVER FUNCTION TESTS: Recent Labs    04/23/24 1107 04/24/24 0616 04/26/24 0959 04/27/24 1758 04/29/24 1255 04/30/24 0700  BILITOT 1.5* 0.9  --   --  0.9 0.4  AST 12* 11*  --   --  12* 11*  ALT 8 9  --   --  10 10  ALKPHOS 83 74  --   --  79 79  PROT 8.2* 7.2  --   --  7.3 7.1  ALBUMIN  4.1 3.4* 3.4* 3.4* 3.4* 3.3*    TUMOR MARKERS: No results for input(s): "AFPTM", "CEA", "CA199", "CHROMGRNA" in the last 8760 hours.  Assessment and Plan: right internal carotid artery stenosis   KISWANA POSTIGLIONE is a 63 y.o. female with history of diabetes, hypertension, hyperlipidemia, GERD, CAD-s/p CABG, anemia, CHF, ESRD-on dialysis, brain aneurysm, depression, DVT, pituitary adenoma, sleep apnea and CVA 5/5-s/p left internal carotid stent placement.   Earlier this morning, patient reported dizziness, vision changes and weakness. She experienced vomiting. Patient was not following commands. Code stroke activated and stroke team evaluated the patient. No evidence of acute infarct on CT and brain MRIs. Patient's clinical status improved upon re-evaluation. After discussion with Dr. Alvira Josephs, procedure proceeding as schedule. Patient reports her symptoms from earlier resolved.   Risks and benefits of cerebral angiogram with intervention were discussed with the patient including, but not limited to  bleeding, infection, vascular injury, contrast induced renal failure, stroke or even death.   This interventional procedure involves the use of X-rays and because of the nature of the planned procedure, it is possible that we will have prolonged use of X-ray fluoroscopy.  Potential radiation risks to you include (but are not limited to) the following: - A slightly elevated risk for cancer  several years later in life. This risk is typically less than 0.5% percent. This risk is low in comparison to the normal incidence of human cancer, which is 33% for women and 50% for men according to the [employer]. - Radiation induced injury can include skin redness, resembling a rash, tissue breakdown / ulcers and hair loss (which can be temporary or permanent).   The likelihood of either of these occurring depends on the difficulty of the procedure and whether you are sensitive to radiation due to previous procedures, disease, or genetic conditions.   IF your procedure requires a prolonged use of radiation, you will be notified and given written instructions for  further action.  It is your responsibility to monitor the irradiated area for the 2 weeks following the procedure and to notify your physician if you are concerned that you have suffered a radiation induced injury.    All of the patient's questions were answered, patient is agreeable to proceed.  Consent signed and in chart.  Thank you for this interesting consult.  I greatly enjoyed meeting Terry Arroyo and look forward to participating in their care.  A copy of this report was sent to the requesting provider on this date.  Electronically Signed: Lawrance Presume, PA 05/01/2024, 11:01 AM   I spent a total of  15 Minutes   in face to face in clinical consultation, greater than 50% of which was counseling/coordinating care for right internal carotid artery stenosis

## 2024-05-01 NOTE — Progress Notes (Signed)
 OT Cancellation Note  Patient Details Name: THAILA CALVANO MRN: [id number] DOB: 1961-07-23   Cancelled Treatment:    Reason Eval/Treat Not Completed: Patient at procedure or test/ unavailable (Will follow up later time)  Status post right common carotid arteriogram.  Randa Riss,HILLARY 05/01/2024, 2:52 PM Milburn Aliment, OT/L   Acute OT Clinical Specialist Acute Rehabilitation Services Pager [phone] Office [phone]

## 2024-05-02 ENCOUNTER — Encounter (HOSPITAL_COMMUNITY): Payer: Self-pay | Admitting: Interventional Radiology

## 2024-05-02 ENCOUNTER — Other Ambulatory Visit (HOSPITAL_COMMUNITY): Payer: Self-pay

## 2024-05-02 DIAGNOSIS — G459 Transient cerebral ischemic attack, unspecified: Secondary | ICD-10-CM | POA: Diagnosis not present

## 2024-05-02 LAB — GLUCOSE, CAPILLARY
Glucose-Capillary: 89 mg/dL (ref 70–99)
Glucose-Capillary: 142 mg/dL — ABNORMAL HIGH (ref 70–99)
Glucose-Capillary: 73 mg/dL (ref 70–99)
Glucose-Capillary: 166 mg/dL — ABNORMAL HIGH (ref 70–99)

## 2024-05-02 LAB — CBC WITH DIFFERENTIAL/PLATELET
Abs Immature Granulocytes: 0.04 10*3/uL (ref 0.00–0.07)
Basophils Absolute: 0.1 10*3/uL (ref 0.0–0.1)
Basophils Relative: 1 %
Eosinophils Absolute: 0.4 10*3/uL (ref 0.0–0.5)
Eosinophils Relative: 4 %
HCT: 30.3 % — ABNORMAL LOW (ref 36.0–46.0)
Hemoglobin: 9.8 g/dL — ABNORMAL LOW (ref 12.0–15.0)
Immature Granulocytes: 0 %
Lymphocytes Relative: 12 %
Lymphs Abs: 1.3 10*3/uL (ref 0.7–4.0)
MCH: 29.5 pg (ref 26.0–34.0)
MCHC: 32.3 g/dL (ref 30.0–36.0)
MCV: 91.3 fL (ref 80.0–100.0)
Monocytes Absolute: 0.9 10*3/uL (ref 0.1–1.0)
Monocytes Relative: 8 %
Neutro Abs: 7.8 10*3/uL — ABNORMAL HIGH (ref 1.7–7.7)
Neutrophils Relative %: 75 %
Platelets: 288 10*3/uL (ref 150–400)
RBC: 3.32 MIL/uL — ABNORMAL LOW (ref 3.87–5.11)
RDW: 13.9 % (ref 11.5–15.5)
WBC: 10.4 10*3/uL (ref 4.0–10.5)
nRBC: 0 % (ref 0.0–0.2)

## 2024-05-02 LAB — BASIC METABOLIC PANEL WITH GFR
Anion gap: 16 — ABNORMAL HIGH (ref 5–15)
BUN: 44 mg/dL — ABNORMAL HIGH (ref 8–23)
CO2: 23 mmol/L (ref 22–32)
Calcium: 8.3 mg/dL — ABNORMAL LOW (ref 8.9–10.3)
Chloride: 95 mmol/L — ABNORMAL LOW (ref 98–111)
Creatinine, Ser: 8.08 mg/dL — ABNORMAL HIGH (ref 0.44–1.00)
GFR, Estimated: 5 mL/min — ABNORMAL LOW (ref >60)
Glucose, Bld: 79 mg/dL (ref 70–99)
Potassium: 4.4 mmol/L (ref 3.5–5.1)
Sodium: 134 mmol/L — ABNORMAL LOW (ref 135–145)

## 2024-05-02 MED ORDER — GABAPENTIN 300 MG PO CAPS
300.0000 mg | ORAL_CAPSULE | Freq: Every day | ORAL | Status: DC
  Administered 2024-05-02: 300 mg via ORAL
  Filled 2024-05-02: qty 1

## 2024-05-02 MED ORDER — PROSOURCE PLUS PO LIQD
30.0000 mL | Freq: Two times a day (BID) | ORAL | Status: DC
  Administered 2024-05-03: 30 mL via ORAL
  Filled 2024-05-02 (×2): qty 30

## 2024-05-02 MED ORDER — TOPIRAMATE 25 MG PO TABS
50.0000 mg | ORAL_TABLET | Freq: Every day | ORAL | Status: DC
  Administered 2024-05-03: 50 mg via ORAL
  Filled 2024-05-02: qty 2

## 2024-05-02 MED ORDER — OXYCODONE-ACETAMINOPHEN 5-325 MG PO TABS
2.0000 | ORAL_TABLET | ORAL | Status: DC | PRN
  Administered 2024-05-02 – 2024-05-03 (×2): 2 via ORAL
  Filled 2024-05-02 (×2): qty 2

## 2024-05-02 MED ORDER — CHLORHEXIDINE GLUCONATE CLOTH 2 % EX PADS
6.0000 | MEDICATED_PAD | Freq: Every day | CUTANEOUS | Status: DC
  Administered 2024-05-02: 6 via TOPICAL

## 2024-05-02 MED ORDER — LOPERAMIDE HCL 2 MG PO CAPS
2.0000 mg | ORAL_CAPSULE | Freq: Once | ORAL | Status: AC
  Administered 2024-05-02: 2 mg via ORAL
  Filled 2024-05-02: qty 1

## 2024-05-02 MED ORDER — HYDROMORPHONE HCL 1 MG/ML IJ SOLN
0.5000 mg | Freq: Four times a day (QID) | INTRAMUSCULAR | Status: DC | PRN
  Administered 2024-05-02 – 2024-05-03 (×3): 0.5 mg via INTRAVENOUS
  Filled 2024-05-02 (×3): qty 0.5

## 2024-05-02 MED ORDER — OXYCODONE-ACETAMINOPHEN 5-325 MG PO TABS: 1.0000 | ORAL_TABLET | ORAL | Status: DC | PRN

## 2024-05-02 MED ORDER — METHOCARBAMOL 750 MG PO TABS
750.0000 mg | ORAL_TABLET | Freq: Four times a day (QID) | ORAL | Status: DC | PRN
  Administered 2024-05-02 – 2024-05-03 (×3): 750 mg via ORAL
  Filled 2024-05-02 (×2): qty 1
  Filled 2024-05-02: qty 2

## 2024-05-02 MED ORDER — HYDROMORPHONE HCL 1 MG/ML IJ SOLN
INTRAMUSCULAR | Status: AC
  Filled 2024-05-02: qty 0.5

## 2024-05-02 NOTE — Progress Notes (Addendum)
 STROKE TEAM PROGRESS NOTE   INTERIM HISTORY/SUBJECTIVE  S/p right common carotid arteriogram 5/13, 45-50% R proximal ICA stenosis. No intervention at this time.  Extubated post-procedure. Now on Aspirin  and Brilinta .  Most recent NIH: 2  No family at bedside, RN at bedside.  Patient sitting up in chair.  Continues to complain of shoulder pain, pain medications have been added.   CBC    Component Value Date/Time   WBC 10.4 05/02/2024 0459   RBC 3.32 (L) 05/02/2024 0459   HGB 9.8 (L) 05/02/2024 0459   HGB 12.6 11/18/2013 0138   HCT 30.3 (L) 05/02/2024 0459   HCT 37.9 11/18/2013 0138   PLT 288 05/02/2024 0459   PLT 444 (H) 11/18/2013 0138   MCV 91.3 05/02/2024 0459   MCV 83 11/18/2013 0138   MCH 29.5 05/02/2024 0459   MCHC 32.3 05/02/2024 0459   RDW 13.9 05/02/2024 0459   RDW 14.1 11/18/2013 0138   LYMPHSABS 1.3 05/02/2024 0459   MONOABS 0.9 05/02/2024 0459   EOSABS 0.4 05/02/2024 0459   BASOSABS 0.1 05/02/2024 0459    BMET    Component Value Date/Time   NA 134 (L) 05/02/2024 0459   NA 134 (L) 11/18/2013 0138   K 4.4 05/02/2024 0459   K 3.8 11/18/2013 0138   CL 95 (L) 05/02/2024 0459   CL 98 11/18/2013 0138   CO2 23 05/02/2024 0459   CO2 29 11/18/2013 0138   GLUCOSE 79 05/02/2024 0459   GLUCOSE 348 (H) 11/18/2013 0138   BUN 44 (H) 05/02/2024 0459   BUN 11 11/18/2013 0138   CREATININE 8.08 (H) 05/02/2024 0459   CREATININE 0.87 11/18/2013 0138   CALCIUM  8.3 (L) 05/02/2024 0459   CALCIUM  9.4 11/18/2013 0138   GFRNONAA 5 (L) 05/02/2024 0459   GFRNONAA >60 11/18/2013 0138    IMAGING past 24 hours VAS US  UPPER EXTREMITY VENOUS DUPLEX Result Date: 05/01/2024 UPPER VENOUS STUDY  Patient Name:  ZOIEE KALINOWSKI  Date of Exam:   05/01/2024 Medical Rec #: [id number]        Accession #:    [id number] Date of Birth: 1961/09/24         Patient Gender: F Patient Age:   63 years Exam Location:  [facility] Procedure:      VAS US  UPPER EXTREMITY VENOUS DUPLEX Referring Phys:  RAMESH KC --------------------------------------------------------------------------------  Indications: Pain Risk Factors: None identified. Comparison Study: No prior studies. Performing Technologist: Lerry Ransom RVT  Examination Guidelines: A complete evaluation includes B-mode imaging, spectral Doppler, color Doppler, and power Doppler as needed of all accessible portions of each vessel. Bilateral testing is considered an integral part of a complete examination. Limited examinations for reoccurring indications may be performed as noted.  Right Findings: +----------+------------+---------+-----------+----------+-------+ RIGHT     CompressiblePhasicitySpontaneousPropertiesSummary +----------+------------+---------+-----------+----------+-------+ Subclavian    Full       Yes       Yes                      +----------+------------+---------+-----------+----------+-------+  Left Findings: +----------+------------+---------+-----------+----------+-------+ LEFT      CompressiblePhasicitySpontaneousPropertiesSummary +----------+------------+---------+-----------+----------+-------+ IJV           Full       Yes       Yes                      +----------+------------+---------+-----------+----------+-------+ Subclavian               Yes  Yes                      +----------+------------+---------+-----------+----------+-------+ Axillary      Full       Yes       Yes                      +----------+------------+---------+-----------+----------+-------+ Brachial      Full                                          +----------+------------+---------+-----------+----------+-------+ Radial        Full                                          +----------+------------+---------+-----------+----------+-------+ Ulnar         Full                                          +----------+------------+---------+-----------+----------+-------+ Cephalic      Full                                           +----------+------------+---------+-----------+----------+-------+ Basilic       Full                                          +----------+------------+---------+-----------+----------+-------+  Summary:  Right: No evidence of thrombosis in the subclavian.  Left: No evidence of deep vein thrombosis in the upper extremity. No evidence of superficial vein thrombosis in the upper extremity.  *See table(s) above for measurements and observations.  Diagnosing physician: Delaney Fearing Electronically signed by Delaney Fearing on 05/01/2024 at 4:07:03 PM.    Final    MR BRAIN WO CONTRAST Result Date: 05/01/2024 CLINICAL DATA:  63 year old female code stroke presentation this morning. Dialysis patient. Severe atherosclerosis in the head and neck on CTA this month. EXAM: MRI HEAD WITHOUT CONTRAST LIMITED TECHNIQUE: Multiplanar, multiecho pulse sequences of the brain and surrounding structures were obtained without intravenous contrast. COMPARISON:  Plain head CT 0847 hours. CT head, CTA, CTP 04/23/24. FINDINGS: Neurology requests a study limited to DWI, sagittal T1 and axial FLAIR imaging. No restricted diffusion to suggest acute infarction. No midline shift, mass effect, evidence of mass lesion, ventriculomegaly, extra-axial collection or acute intracranial hemorrhage. Cervicomedullary junction and pituitary are within normal limits. Axial FLAIR imaging demonstrates scattered, generally small hyperintense foci in the bilateral cerebral white matter. The extent is mild to moderate for age. No cortical encephalomalacia identified. Incidental midline nasopharyngeal retention cysts (Tornwaldt cyst, normal variant). Negative for age visible cervical spine. IMPRESSION: Negative for acute infarct. No evidence of acute intracranial abnormality on limited exam. Electronically Signed   By: Marlise Simpers M.D.   On: 05/01/2024 10:26    Vitals:   05/02/24 0500 05/02/24 0600 05/02/24 0700  05/02/24 0800  BP: (!) 123/55 (!) 109/57 127/61 128/62  Pulse: 61 (!) 59 61 61  Resp: 16 16 16 19   Temp:  98.1 F (36.7 C)  TempSrc:    Oral  SpO2: 100% 100% 100% 100%  Weight:      Height:        PHYSICAL EXAM General:  Alert, well-nourished, well-developed patient, anxious and crying Psych:  Mood and affect appropriate for situation CV: Regular rate and rhythm on monitor Respiratory:  Regular, unlabored respirations on room air GI: Abdomen soft and nontender   NEURO:  Mental Status: AA&Ox3 Speech/Language: Mild dysarthria, no aphasia.  Naming, repetition, comprehension intact.  Good attention.  Cranial Nerves:  II: PERRL. Visual fields full.  III, IV, VI: EOMI. Eyelids elevate symmetrically.  V: Sensation is intact to light touch and symmetrical to face.  VII: Face is symmetrical resting and smiling VIII: hearing intact to voice. IX, X: Palate elevates symmetrically. Phonation is normal.  ZO:[id number] shrug 5/5. XII: tongue is midline without fasciculations. Motor: 5/5 strength in right arm and right leg, left arm limited due to pain , left leg 4/5 Tone: is normal and bulk is normal Sensation- decreased sensation on left body Extinction absent to light touch to DSS.   Coordination: FTN intact bilaterally, HKS: no ataxia in BLE.No drift.  Gait- deferred  Most Recent NIH 5  ASSESSMENT/PLAN  Ms. DOVEY WILLNER is a 63 y.o. female with history of  Stroke 2014 affecting left side, Left ICA stent placement 04/2024 for acute stroke and discharged 5/10 on aspirin  and brilinta , ESRD, CAD, CHFrEF, s/p CABG 2018, MI 2018, OSA who was BIB EMS as an activated CODE STROKE due to left sided weakness and slurred speech. NIH on Admission 10  Right brain TIA, etiology:  likely right ICA tandem stenosis Code Stroke  CT head No acute abnormality. ASPECTS 10.    MRI  No acute infarction, hemorrhage, or mass  MRA  Greater than 70% stenosis of the proximal right ICA. Right greater than  left intracranial ICA atherosclerosis and stenosis  CT head 5/13 no acute process MRI brain 5/13 no acute process Carotid Doppler right ICA 40 to 59% stenosis, left ICA stent patent less than 50% stenosis. Cerebral angiogram showed right ICA 45 to 50% stenosis.  Right ICA cavernous segment 50% stenosis. VTE prophylaxis - hep SQ aspirin  81 mg daily and Brilinta  (ticagrelor ) 90 mg bid prior to admission, now on aspirin  81 mg daily and Brilinta  (ticagrelor ) 90 mg bid per interventional radiologist  Therapy recommendations: Home health PT Disposition:  pending   Hx of Stroke/TIA Symptomatic bilateral carotid stenosis s/p L ICA stent  Per report, stroke in 2014 with left-sided residual Admitted 04/23/2024 for right-sided weakness and aphasia.  CT no acute abnormality.  CTA head and neck left ICA string sign, bilateral ICA siphon severe stenosis, right ICA 70 to 80% stenosis.  Bilateral moderate VA stenosis.  MRI no infarct but chronic CSVT with collateral flow.  CT perfusion negative.  2D Echo EF 35-40%.  EEG negative for seizure.  LDL 75, A1c 8.6.  Status post left ICA stent placement.  Discharged on aspirin  and Brilinta  and Lipitor  80.   This time admitted with left sided symptoms concerning for right ICA symptomatic stenosis  Carotid Doppler right ICA 40 to 59% stenosis, left ICA stent patent less than 50% stenosis. Cerebral angiogram showed right ICA 45 to 50% stenosis.  Right ICA cavernous segment 50% stenosis. No right carotid intervention needed at this time  Hypertension Home meds: Amlodipine  10 mg daily, carvedilol  25 mg twice daily Stable Avoid hypotension given carotid stenosis Long-term goal normotensive  Hyperlipidemia Home meds: Atorvastatin  80 mg daily Lipitor  80 resumed in hospital LDL 75, goal < 70 Continue statin at discharge   Diabetes type II Uncontrolled Home meds: NovoLog  70/30 15 units 3 times daily with meals HgbA1c 8.6, goal < 7.0 CBGs SSI Recommend close  follow-up with PCP for better DM control   End-stage renal disease on dialysis Patient is typically dialyzed Monday Wednesday Friday Nephrology consulted for inpatient dialysis Had HD 5/12   Left elbow pain Orthopedic on board, on left arm sling CT left elbow showed subcutaneous swelling, no fracture or dislocation Supportive care Pain management  Other Stroke Risk Factors Coronary artery disease status post CABG 2018 Congestive heart failure with EF 35 to 40% OSA, noncompliant with nocturnal BiPAP Hx of DVT LLE 2018  Other medical issues Cerebral aneurysm, CVA and MRA showed 2 to 3 mm left MCA bifurcation saccular aneurysm.  Continue follow-up with Dr. Devesh for as outpatient for monitoring. Pituitary adenoma status postsurgery COPD--continue home meds  Hospital day # 1   Pt seen by Neuro NP/APP and later by MD. Note/plan to be edited by MD as needed.    Audrene Lease, DNP [facility] Please use AMION for contact information & EPIC for messaging.  ATTENDING NOTE: I reviewed above note and agree with the assessment and plan. Pt was seen and examined.   Pt sitting in bed, screaming for bathroom, pt her in bedside commode. Her speech is clear with only slight dysarthria, much improved from yesterday, no significant weakness on exam and observation. Still has significant left arm swelling and pain. Had angiogram yesterday no intervention needed on the right ICA. Will continue ASA and brilinta  and statin. Follow up with NIR as outpt. PT and OT recommend HH.   For detailed assessment and plan, please refer to above as I have made changes wherever appropriate.   Neurology will sign off. Please call with questions. Pt will follow up with stroke clinic NP at [facility] in about 4 weeks. Thanks for the consult.  Consuelo Denmark, MD PhD Stroke Neurology 05/02/2024 2:28 PM      To contact Stroke Continuity provider, please refer to [email or web address]. After hours, contact General  Neurology

## 2024-05-02 NOTE — Progress Notes (Signed)
 Physical Therapy Treatment Patient Details Name: Terry Arroyo MRN: [id number] DOB: 1961/09/21 Today's Date: 05/02/2024   History of Present Illness Patient is a 63 year old female admitted with left side weakness, arm pain, and slurred speech. MRI (-) for CVA. Work up for TIA. Recent admission 5/5-5/10 acute ischemic MCA  CVA secondary to left ICA stenosis treated with stenting and DAPT.  MRI following intervention did not show infarct so the stroke was aborted by the intervention. CT left elbow: 1. Moderate-sized elbow joint effusion without evidence of erosive  disease or intra-articular air. 2. Dorsal subcutaneous edema within the distal upper arm, extending  posterior to the elbow and into the dorsal aspect of the proximal forearm. 3. Small amount of air in the antecubital fossa without associated  focal fluid collection, likely related to a peripheral IV or reported recent contrast extravasation. S/p right common carotid arteriogram 5/13.  History of ESRD on HD, HTN, CAD, CVA, CHF, DVT, COPD, left eye blindness.    PT Comments  Pt agreeable to participate in physical therapy session. Pt reporting 3/10 "throbbing," left elbow pain radiating distally into forearm. Pt with preference to don sling prior to mobilizing. Pt was able to complete toileting and peri care with R hand, although she is left hand dominant. Pt ambulating 120 ft with no assistive device and up to min assist for balance, although this was quite a stretch for her and she had dyspnea on exertion and increased fatigue once back in the room. SpO2 91-94% on RA. Due to deficits and decreased caregiver support, pt will benefit from continued inpatient follow up therapy, <3 hours/day    If plan is discharge home, recommend the following: A little help with walking and/or transfers;A little help with bathing/dressing/bathroom;Help with stairs or ramp for entrance;Assistance with cooking/housework   Can travel by private vehicle     Yes   Equipment Recommendations  None recommended by PT    Recommendations for Other Services       Precautions / Restrictions Precautions Precautions: Fall Recall of Precautions/Restrictions: Intact Required Braces or Orthoses: Sling (for comfort) Restrictions Weight Bearing Restrictions Per Provider Order: No     Mobility  Bed Mobility Overal bed mobility: Modified Independent             General bed mobility comments: HOB elevated    Transfers Overall transfer level: Needs assistance Equipment used: None Transfers: Sit to/from Stand Sit to Stand: Supervision                Ambulation/Gait Ambulation/Gait assistance: Min assist Gait Distance (Feet): 120 Feet Assistive device: None Gait Pattern/deviations: Step-through pattern, Drifts right/left Gait velocity: decreased Gait velocity interpretation: <1.8 ft/sec, indicate of risk for recurrent falls   General Gait Details: Support underneath R elbow for postural stability, pt with dynamic imbalance, particularly with fatigue.   Stairs             Wheelchair Mobility     Tilt Bed    Modified Rankin (Stroke Patients Only)       Balance Overall balance assessment: Needs assistance Sitting-balance support: Feet supported Sitting balance-Leahy Scale: Good     Standing balance support: No upper extremity supported, During functional activity Standing balance-Leahy Scale: Fair                              Musician Communication: No apparent difficulties  Cognition Arousal: Alert Behavior During Therapy: [facility] for  tasks assessed/performed   PT - Cognitive impairments: Orientation, Memory, Attention   Orientation impairments: Time                   PT - Cognition Comments: Pt disoriented to day of week and year - reports it is 2005, but able to recall after being re-oriented. Pt with delayed response time with ambulation (takes seconds to respond to  questions). Following commands: Intact      Cueing Cueing Techniques: Verbal cues  Exercises      General Comments        Pertinent Vitals/Pain Pain Assessment Pain Assessment: 0-10 Pain Score: 3  Pain Location: L elbow and distally into forearm Pain Descriptors / Indicators: Guarding, Throbbing Pain Intervention(s): Limited activity within patient's tolerance, Monitored during session    Home Living                          Prior Function            PT Goals (current goals can now be found in the care plan section) Acute Rehab PT Goals Patient Stated Goal: improved elbow pain Potential to Achieve Goals: Good Progress towards PT goals: Progressing toward goals    Frequency    Min 2X/week      PT Plan      Co-evaluation              AM-PAC PT "6 Clicks" Mobility   Outcome Measure  Help needed turning from your back to your side while in a flat bed without using bedrails?: None Help needed moving from lying on your back to sitting on the side of a flat bed without using bedrails?: None Help needed moving to and from a bed to a chair (including a wheelchair)?: A Little Help needed standing up from a chair using your arms (e.g., wheelchair or bedside chair)?: A Little Help needed to walk in hospital room?: A Little Help needed climbing 3-5 steps with a railing? : A Lot 6 Click Score: 19    End of Session Equipment Utilized During Treatment: Gait belt;Other (comment) (sling) Activity Tolerance: Patient tolerated treatment well Patient left: in chair;with call bell/phone within reach;with chair alarm set Nurse Communication: Mobility status PT Visit Diagnosis: Unsteadiness on feet (R26.81);Pain Hemiplegia - Right/Left: Right Hemiplegia - dominant/non-dominant: Non-dominant Pain - Right/Left: Left Pain - part of body: Arm     Time: 4098-1191 PT Time Calculation (min) (ACUTE ONLY): 29 min  Charges:    $Therapeutic Activity: 23-37  mins PT General Charges $$ ACUTE PT VISIT: 1 Visit                     Terry Arroyo, PT, DPT Acute Rehabilitation Services Office [phone]    Terry Arroyo 05/02/2024, 10:49 AM

## 2024-05-02 NOTE — Progress Notes (Signed)
 PROGRESS NOTE MELENDA WARNING  YQM:[id number] DOB: 11-15-1961 DOA: 04/29/2024 PCP: Jimmy Moulding, MD  Brief Narrative/Hospital Course: [place].[age] y.o. female with medical history significant of diabetes, neuropathy, hypertension, hyperlipidemia, GERD, stroke, CAD status post CABG, anemia, chronic combined systolic and diastolic CHF, ESRD on HD, left eye blindness, brain aneurysm, benign pituitary neoplasm, DVT, depression, cataract, COPD, OSA presenting with slurred speech, left-sided weakness and some left arm pain.   Recent admitted 5/5-5/10 on neurology service for acute ischemic MCA  secondary to left ICA stenosis treated with stenting and DAPT.  MRI following intervention did not show infarct so the stroke was aborted by the intervention. In the ED :Vital signs in the ED notable for blood pressure in the 110s-160s systolic, heart rate in the 50s-60s.  Lab creatinine stable at 8.5, glucose 160, albumin  3.4.  CBC with hemoglobin stable at 10.2.  PT, PTT, INR within normal limits.  Ethanol level negative.  CT head>>no acute abnormality. MRI brain >>motion degradation but no acute abnormality. MRA of the head and neck>> showed similar carotid artery disease with greater than 70% stenosis on the right side and status post intervention on the left.  Also noted was stable aneurysm. Left elbow x-ray showed questionable radial neck fracture with moderate elbow effusion and possible evidence of extravasation of contrast at the soft tissue near the ulna. Patient received Dilaudid  in the ED.  Neurology was consulted and evaluated the patient-possible TIA/symptomatic right ICA stenosis.  With modified TIA workup recommended given recent workup for stroke  Subjective; Seen and examined At the bedside chair complains of pain mostly on left forearm and the wrist Reports left elbow does not hurt as much and less swollen now Reports pain medication overall not working well and requesting to resume her Dilaudid   IV Overnight afebrile, BP stable on room air  Labs this morning BUN 44 creatinine 8.0 CBC remains stable with chronic anemia 9.8 Hb  Assessment and plan:  TIA Recent CVA and admission after a left ICA stenosis leading to MCA ischemia s/p stent Right carotid artery stenosis Cerebral aneurysm: Admitted after episode of tiredness, slurred speech, left arm weakness and left arm pain. Imaging findings unremarkable, neurology consulted recommended modified TIA workup  Imaging obtained, showing right internal carotid artery stenosis- Neuro IR and neurology on board 2D echo EF 35-40% G2 DD.  LDL 75 A1c 8.6. Continue aspirin  81, Brilinta , Lipitor  80 Repeat CT brain MRI brain no acute finding S/p cerebral angiogram with intervention 5/13-showed 45 to 50% right proximal ICA stenosis Cont neurocheck per protocol   Left elbow pain/effusion Ortho input appreciated, CT elbow shows moderate size elbow joint effusion without evidence of erosive disease or intra-articular air-disease nonspecific and could be degenerative or inflammatory, dorsal subcutaneous edema in the distal upper arm extending posterior to the elbow small amount of urine in the antecubital fossa no acute osseous finding or evidence of osteomyelitis Continue sling, discussed with Ortho PA -no indication for fluid aspiration  Continue pain control, anti-inflammatory  ESRD on HD MWF Nephrology consulted for HD continue HD per schedule   Hypertension BP remains controlled.Currently meds on hold.    Hyperlipidemia Continue atorvastatin  80 mg   CAD s/p CABG: On ASA, Brilinta , atorvastatin    Anemia of chronic kidney disease Hb stable at 10 g baseline.  Monitor as below Recent Labs  Lab 04/29/24 1255 04/29/24 1302 04/30/24 0700 05/01/24 0612 05/02/24 0459  HGB 10.2* 10.5* 10.1* 10.9* 9.8*  HCT 33.2* 31.0* 31.3* 34.4* 30.3*  Chronic combined systolic and diastolic CHF Last echo was 4 days PTA EF 35-40%, G2 DD, mild reduced  RV function.  MR, TR. Manage volume with HD.Holding carvedilol    DM A1c 8.6 blood sugar fluctuating at times >200, currently controlled on SSI Recent Labs  Lab 05/01/24 1322 05/01/24 1705 05/01/24 2122 05/02/24 0734 05/02/24 1121  GLUCAP 124* 100* 76 89 142*     History of brain aneurysm Stable on imaging   Depression Mood stable on mirtazapine  and trazodone    COPD Continue home albuterol    OSA Continue home CPAP   History of cataracts History of DVT Left eye blindness Noted.  Continue supportive care,   DVT prophylaxis: heparin  injection 5,000 Units Start: 04/29/24 1530 Code Status:   Code Status: Full Code Family Communication: plan of care discussed with patient/ at bedside. Patient status is: Remains hospitalized because of severity of illness Level of care: Telemetry Medical   Dispo: The patient is from: home            Anticipated disposition: SNF  Objective: Vitals last 24 hrs: Vitals:   05/02/24 0600 05/02/24 0700 05/02/24 0800 05/02/24 1200  BP: (!) 109/57 127/61 128/62   Pulse: (!) 59 61 61   Resp: 16 16 19    Temp:   98.1 F (36.7 C) 98.3 F (36.8 C)  TempSrc:   Oral Oral  SpO2: 100% 100% 100%   Weight:      Height:        Physical Examination: General exam: alert awake, oriented at baseline, older than stated age HEENT:Oral mucosa moist, Ear/Nose WNL grossly Respiratory system: Bilaterally clear BS,no use of accessory muscle Cardiovascular system: S1 & S2 +, No JVD. Gastrointestinal system: Abdomen soft,NT,ND, BS+ Nervous System: AAO X3, mild dysarthria no aphasia decree sensation on left, left arm limited mobility due to her pain, left leg strength Extremities: LE edema neg,distal peripheral pulses palpable and warm.  Skin: No rashes,no icterus. MSK: Normal muscle bulk,tone, power   Data Reviewed: I have personally reviewed following labs and imaging studies ( see epic result tab) CBC: Recent Labs  Lab 04/27/24 1758 04/29/24 1255  04/29/24 1302 04/30/24 0700 05/01/24 0612 05/02/24 0459  WBC 7.8 10.0  --  9.4 11.6* 10.4  NEUTROABS  --  7.3  --   --   --  7.8*  HGB 10.8* 10.2* 10.5* 10.1* 10.9* 9.8*  HCT 33.2* 33.2* 31.0* 31.3* 34.4* 30.3*  MCV 91.0 95.1  --  91.8 92.0 91.3  PLT 240 267  --  278 317 288   CMP: Recent Labs  Lab 04/26/24 0959 04/27/24 1758 04/29/24 1255 04/29/24 1302 04/30/24 0700 05/01/24 0612 05/02/24 0459  NA 132* 135 135 134* 133* 133* 134*  K 4.4 4.2 4.6 4.6 4.9 4.4 4.4  CL 95* 95* 96* 99 94* 93* 95*  CO2 22 25 24   --  23 23 23   GLUCOSE 176* 139* 160* 159* 205* 174* 79  BUN 62* 36* 56* 51* 67* 37* 44*  CREATININE 9.03* 5.83* 8.05* 8.10* 9.54* 6.61* 8.08*  CALCIUM  8.7* 8.9 8.9  --  8.6* 9.1 8.3*  PHOS 5.8* 3.5  --   --   --   --   --    GFR: Estimated Creatinine Clearance: 7.5 mL/min (A) (by C-G formula based on SCr of 8.08 mg/dL (H)). Recent Labs  Lab 04/26/24 0959 04/27/24 1758 04/29/24 1255 04/30/24 0700  AST  --   --  12* 11*  ALT  --   --  10 10  ALKPHOS  --   --  79 79  BILITOT  --   --  0.9 0.4  PROT  --   --  7.3 7.1  ALBUMIN  3.4* 3.4* 3.4* 3.3*   No results for input(s): "LIPASE", "AMYLASE" in the last 168 hours. No results for input(s): "AMMONIA" in the last 168 hours. Coagulation Profile:  Recent Labs  Lab 04/29/24 1255 04/30/24 2250  INR 1.1 1.1   Unresulted Labs (From admission, onward)     Start     Ordered   05/02/24 0911  Hepatitis B surface antigen  (New Admission [facility] (Hepatitis B))  Once,   R       Question:  Specimen collection method  Answer:  Unit=Unit collect   05/02/24 0913   05/02/24 0911  Hepatitis B surface antibody,quantitative  (New Admission [facility] (Hepatitis B))  Once,   R       Question:  Specimen collection method  Answer:  Unit=Unit collect   05/02/24 0913   05/01/24 0500  Basic metabolic panel with GFR  Daily,   R     Question:  Specimen collection method  Answer:  Lab=Lab collect   04/30/24 1355   05/01/24 0500  CBC   Daily,   R     Question:  Specimen collection method  Answer:  Lab=Lab collect   04/30/24 1355           Antimicrobials/Microbiology: Anti-infectives (From admission, onward)    None         Component Value Date/Time   SDES BLOOD LEFT WRIST 02/10/2022 2205   SPECREQUEST  02/10/2022 2205    BOTTLES DRAWN AEROBIC AND ANAEROBIC Blood Culture adequate volume   CULT  02/10/2022 2205    NO GROWTH 5 DAYS Performed at [facility], [address] [place], [place] [address]    REPTSTATUS 02/15/2022 FINAL 02/10/2022 2205    Procedure(s) (LRB): RADIOLOGY WITH ANESTHESIA (N/A) Medications reviewed:  Scheduled Meds:  aspirin   81 mg Oral Daily   Or   aspirin   81 mg Per Tube Daily   atorvastatin   80 mg Oral q1800   Chlorhexidine  Gluconate Cloth  6 each Topical Q0600   Chlorhexidine  Gluconate Cloth  6 each Topical Q0600   gabapentin   300 mg Oral QHS   heparin   2,000 Units Dialysis Once in dialysis   heparin   5,000 Units Subcutaneous Q8H   insulin  aspart  0-15 Units Subcutaneous TID WC   mirtazapine   30 mg Oral QHS   multivitamin  1 tablet Oral Daily   ticagrelor   90 mg Oral BID   Or   ticagrelor   90 mg Per Tube BID   topiramate   50 mg Oral Daily   traZODone   25 mg Oral QHS   Continuous Infusions:  anticoagulant sodium citrate       Lesa Rape, MD [facility] 05/02/2024, 12:55 PM

## 2024-05-02 NOTE — Progress Notes (Signed)
 Hypoglycemic Event  CBG: 66  Treatment: 4 oz juice/soda  Symptoms: None  Follow-up CBG: Time:1925 CBG Result:73  Possible Reasons for Event: Inadequate meal intake and Other:    Comments/MD notified:asymptomatic    Terry Arroyo

## 2024-05-02 NOTE — Progress Notes (Signed)
 [facility] Progress Note   Subjective:   Patient seen in her room this morning. She has some complaints about pain in her left arm and residual weakness. Today, she denies any dyspnea or CP. We are awaiting her OP HD orders from [facility]. Plan for HD today to keep her on her MWF schedule. Patient unclear what her dry weight is.   Objective Vitals:   05/02/24 0600 05/02/24 0700 05/02/24 0800 05/02/24 1200  BP: (!) 109/57 127/61 128/62   Pulse: (!) 59 61 61   Resp: 16 16 19    Temp:   98.1 F (36.7 C) 98.3 F (36.8 C)  TempSrc:   Oral Oral  SpO2: 100% 100% 100%   Weight:      Height:       Physical Exam General: Alert, on room air, no acute distress Heart: RRR, no MGR Lungs: Clear to auscultation bilaterally, no wheezing or rales Abdomen: soft, non-tender, non-disteded Extremities: No LE or UE edema Dialysis Access: R AVF +b/t  Additional Objective Labs: Basic Metabolic Panel: Recent Labs  Lab 04/26/24 0959 04/27/24 1758 04/29/24 1255 04/30/24 0700 05/01/24 0612 05/02/24 0459  NA 132* 135   < > 133* 133* 134*  K 4.4 4.2   < > 4.9 4.4 4.4  CL 95* 95*   < > 94* 93* 95*  CO2 22 25   < > 23 23 23   GLUCOSE 176* 139*   < > 205* 174* 79  BUN 62* 36*   < > 67* 37* 44*  CREATININE 9.03* 5.83*   < > 9.54* 6.61* 8.08*  CALCIUM  8.7* 8.9   < > 8.6* 9.1 8.3*  PHOS 5.8* 3.5  --   --   --   --    < > = values in this interval not displayed.   Liver Function Tests: Recent Labs  Lab 04/27/24 1758 04/29/24 1255 04/30/24 0700  AST  --  12* 11*  ALT  --  10 10  ALKPHOS  --  79 79  BILITOT  --  0.9 0.4  PROT  --  7.3 7.1  ALBUMIN  3.4* 3.4* 3.3*   No results for input(s): "LIPASE", "AMYLASE" in the last 168 hours. CBC: Recent Labs  Lab 04/27/24 1758 04/29/24 1255 04/29/24 1302 04/30/24 0700 05/01/24 0612 05/02/24 0459  WBC 7.8 10.0  --  9.4 11.6* 10.4  NEUTROABS  --  7.3  --   --   --  7.8*  HGB 10.8* 10.2*   < > 10.1* 10.9* 9.8*  HCT 33.2*  33.2*   < > 31.3* 34.4* 30.3*  MCV 91.0 95.1  --  91.8 92.0 91.3  PLT 240 267  --  278 317 288   < > = values in this interval not displayed.   Blood Culture    Component Value Date/Time   SDES BLOOD LEFT WRIST 02/10/2022 2205   SPECREQUEST  02/10/2022 2205    BOTTLES DRAWN AEROBIC AND ANAEROBIC Blood Culture adequate volume   CULT  02/10/2022 2205    NO GROWTH 5 DAYS Performed at [facility], [address] [place], [place] [address]    REPTSTATUS 02/15/2022 FINAL 02/10/2022 2205    Cardiac Enzymes: No results for input(s): "CKTOTAL", "CKMB", "CKMBINDEX", "TROPONINI" in the last 168 hours. CBG: Recent Labs  Lab 05/01/24 1322 05/01/24 1705 05/01/24 2122 05/02/24 0734 05/02/24 1121  GLUCAP 124* 100* 76 89 142*   Iron Studies: No results for input(s): "IRON", "TIBC", "TRANSFERRIN", "FERRITIN" in  the last 72 hours. @lablastinr3 @ Studies/Results: VAS US  UPPER EXTREMITY VENOUS DUPLEX Result Date: 05/01/2024 UPPER VENOUS STUDY  Patient Name:  Terry Arroyo  Date of Exam:   05/01/2024 Medical Rec #: [id number]        Accession #:    [id number] Date of Birth: [birth year]         Patient Gender: F Patient Age:   82 years Exam Location:  [facility] Procedure:      VAS US  UPPER EXTREMITY VENOUS DUPLEX Referring Phys: RAMESH KC --------------------------------------------------------------------------------  Indications: Pain Risk Factors: None identified. Comparison Study: No prior studies. Performing Technologist: Lerry Ransom RVT  Examination Guidelines: A complete evaluation includes B-mode imaging, spectral Doppler, color Doppler, and power Doppler as needed of all accessible portions of each vessel. Bilateral testing is considered an integral part of a complete examination. Limited examinations for reoccurring indications may be performed as noted.  Right Findings: +----------+------------+---------+-----------+----------+-------+ RIGHT      CompressiblePhasicitySpontaneousPropertiesSummary +----------+------------+---------+-----------+----------+-------+ Subclavian    Full       Yes       Yes                      +----------+------------+---------+-----------+----------+-------+  Left Findings: +----------+------------+---------+-----------+----------+-------+ LEFT      CompressiblePhasicitySpontaneousPropertiesSummary +----------+------------+---------+-----------+----------+-------+ IJV           Full       Yes       Yes                      +----------+------------+---------+-----------+----------+-------+ Subclavian               Yes       Yes                      +----------+------------+---------+-----------+----------+-------+ Axillary      Full       Yes       Yes                      +----------+------------+---------+-----------+----------+-------+ Brachial      Full                                          +----------+------------+---------+-----------+----------+-------+ Radial        Full                                          +----------+------------+---------+-----------+----------+-------+ Ulnar         Full                                          +----------+------------+---------+-----------+----------+-------+ Cephalic      Full                                          +----------+------------+---------+-----------+----------+-------+ Basilic       Full                                          +----------+------------+---------+-----------+----------+-------+  Summary:  Right: No evidence of thrombosis in the subclavian.  Left: No evidence of deep vein thrombosis in the upper extremity. No evidence of superficial vein thrombosis in the upper extremity.  *See table(s) above for measurements and observations.  Diagnosing physician: Delaney Fearing Electronically signed by Delaney Fearing on 05/01/2024 at 4:07:03 PM.    Final    MR BRAIN WO CONTRAST Result  Date: 05/01/2024 CLINICAL DATA:  63 year old female code stroke presentation this morning. Dialysis patient. Severe atherosclerosis in the head and neck on CTA this month. EXAM: MRI HEAD WITHOUT CONTRAST LIMITED TECHNIQUE: Multiplanar, multiecho pulse sequences of the brain and surrounding structures were obtained without intravenous contrast. COMPARISON:  Plain head CT 0847 hours. CT head, CTA, CTP 04/23/24. FINDINGS: Neurology requests a study limited to DWI, sagittal T1 and axial FLAIR imaging. No restricted diffusion to suggest acute infarction. No midline shift, mass effect, evidence of mass lesion, ventriculomegaly, extra-axial collection or acute intracranial hemorrhage. Cervicomedullary junction and pituitary are within normal limits. Axial FLAIR imaging demonstrates scattered, generally small hyperintense foci in the bilateral cerebral white matter. The extent is mild to moderate for age. No cortical encephalomalacia identified. Incidental midline nasopharyngeal retention cysts (Tornwaldt cyst, normal variant). Negative for age visible cervical spine. IMPRESSION: Negative for acute infarct. No evidence of acute intracranial abnormality on limited exam. Electronically Signed   By: Marlise Simpers M.D.   On: 05/01/2024 10:26   CT HEAD CODE STROKE WO CONTRAST Result Date: 05/01/2024 CLINICAL DATA:  Code stroke. Neuro deficit, concern for stroke, left arm weakness. EXAM: CT HEAD WITHOUT CONTRAST TECHNIQUE: Contiguous axial images were obtained from the base of the skull through the vertex without intravenous contrast. RADIATION DOSE REDUCTION: This exam was performed according to the departmental dose-optimization program which includes automated exposure control, adjustment of the mA and/or kV according to patient size and/or use of iterative reconstruction technique. COMPARISON:  CT head and MRI head 04/29/2024. FINDINGS: Brain: No acute intracranial hemorrhage. No CT evidence of acute infarct. No edema, mass  effect, or midline shift. The basilar cisterns are patent. Ventricles: The ventricles are normal. Vascular: Atherosclerotic calcifications of the carotid siphons and intracranial vertebral arteries. No hyperdense vessel. Skull: No acute or aggressive finding. Orbits: Right lens replacement.  Orbits otherwise unremarkable. Sinuses: Mild mucosal thickening in the left posterior ethmoid air cells. Postsurgical changes of the sphenoid sinuses. Other: Trace fluid in the right mastoid air cells. ASPECTS [facility] Stroke Program Early CT Score) - Ganglionic level infarction (caudate, lentiform nuclei, internal capsule, insula, M1-M3 cortex): 7 - Supraganglionic infarction (M4-M6 cortex): 3 Total score (0-10 with 10 being normal): 10 IMPRESSION: 1. No CT evidence of acute intracranial abnormality. 2. ASPECTS is 10 These results were communicated to Dr. Christiane Cowing at 9:06 am on 05/01/2024 by text page via the [facility] messaging system. Electronically Signed   By: Denny Flack M.D.   On: 05/01/2024 09:07   US  LT UPPER EXTREM LTD SOFT TISSUE NON VASCULAR Result Date: 04/30/2024 CLINICAL DATA:  Elbow pain and swelling.  Joint effusion. EXAM: ULTRASOUND LEFT UPPER EXTREMITY LIMITED TECHNIQUE: Ultrasound examination of the upper extremity soft tissues was performed in the area of clinical concern. COMPARISON:  Elbow radiographs 04/29/2024 and 04/30/2024. CT 04/30/2024. FINDINGS: Examination was targeted to the posterior aspect of the left elbow. There is generalized subcutaneous edema, as seen on CT. A focal fluid collection posteriorly measures approximately 3.6 x 1.4 x 2.4 cm. This has a deep location adjacent to bone, and as correlated with same day  CT likely represents the known elbow joint effusion. No other focal periarticular fluid collections are identified. IMPRESSION: Focal fluid collection posterior to the left elbow likely represents the known elbow joint effusion. No other focal periarticular fluid collections identified. If  there is concern of septic arthritis, consider further evaluation with MRI with and without contrast or arthrocentesis. Electronically Signed   By: Elmon Hagedorn M.D.   On: 04/30/2024 21:44   CT ELBOW LEFT WO CONTRAST Result Date: 04/30/2024 CLINICAL DATA:  Bone lesion, elbow, incidental Elbow pain and swelling with joint effusion on radiographs. EXAM: CT OF THE UPPER LEFT EXTREMITY WITHOUT CONTRAST TECHNIQUE: Multidetector CT imaging of the left elbow was performed according to the standard protocol. RADIATION DOSE REDUCTION: This exam was performed according to the departmental dose-optimization program which includes automated exposure control, adjustment of the mA and/or kV according to patient size and/or use of iterative reconstruction technique. COMPARISON:  Elbow radiographs 04/29/2024 and 04/30/2024. Ultrasound 04/30/2024. FINDINGS: Bones/Joint/Cartilage No evidence of acute fracture, dislocation or bone destruction. Mild spurring of the coronoid process. There is a moderate-sized elbow joint effusion without evidence of erosive disease or intra-articular air. Ligaments Suboptimally assessed by CT. Muscles and Tendons No intramuscular fluid collection or focal atrophy identified on noncontrast imaging. As evaluated by CT, the biceps and triceps tendons appear intact. Soft tissues Dorsal subcutaneous edema within the distal upper arm, extending posterior to the elbow and into the dorsal aspect of the proximal forearm. There may be a small amount of fluid within the olecranon bursa. Small amount of air in the antecubital fossa without associated focal fluid collection, likely related to a peripheral IV or reported recent contrast extravasation. IMPRESSION: 1. Moderate-sized elbow joint effusion without evidence of erosive disease or intra-articular air. This is nonspecific and could be degenerative or inflammatory. If there is clinical concern for septic arthritis, consider arthrocentesis. 2. Dorsal  subcutaneous edema within the distal upper arm, extending posterior to the elbow and into the dorsal aspect of the proximal forearm. No associated focal fluid collection. 3. Small amount of air in the antecubital fossa without associated focal fluid collection, likely related to a peripheral IV or reported recent contrast extravasation. 4. No acute osseous findings or evidence of osteomyelitis. Electronically Signed   By: Elmon Hagedorn M.D.   On: 04/30/2024 21:34   DG Elbow 2 Views Left Result Date: 04/30/2024 CLINICAL DATA:  Pain and swelling after an IV insertion. EXAM: LEFT ELBOW - 2 VIEW COMPARISON:  04/29/2024 FINDINGS: Soft tissue swelling over the olecranon. Mild subcutaneous edema in the posterior aspect of the distal upper arm. No acute osseous abnormality. IMPRESSION: 1. Edema over the olecranon may be related to extravasated contrast material or olecranon bursitis. 2. Subcutaneous edema in the dorsal aspect of the distal upper arm, compatible with recent contrast extravasation. Electronically Signed   By: Shearon Denis M.D.   On: 04/30/2024 19:27   Medications:  anticoagulant sodium citrate       aspirin   81 mg Oral Daily   Or   aspirin   81 mg Per Tube Daily   atorvastatin   80 mg Oral q1800   Chlorhexidine  Gluconate Cloth  6 each Topical Q0600   Chlorhexidine  Gluconate Cloth  6 each Topical Q0600   gabapentin   300 mg Oral QHS   heparin   2,000 Units Dialysis Once in dialysis   heparin   5,000 Units Subcutaneous Q8H   insulin  aspart  0-15 Units Subcutaneous TID WC   mirtazapine   30 mg Oral QHS  multivitamin  1 tablet Oral Daily   ticagrelor   90 mg Oral BID   Or   ticagrelor   90 mg Per Tube BID   topiramate   50 mg Oral Daily   traZODone   25 mg Oral QHS    Dialysis Orders [facility] MWF Awaiting HD orders from clinic   Renal-related home meds: Avastin 10 mg daily Coreg  25 twice daily Rena-Vite multivitamin 1 daily Others: Brilinta , trazodone , Remeron , Neurontin ,  Lipitor , aspirin , Zetia , Topamax , NovoLog  FlexPen   Assessment/Plan: TIA/ recent CVA/ carotid disease: s/p recent thrombectomy and L ICA stenting. Code stroke called on 5/13 due to disorientation, left arm weakness, left decreased sensation, and dysarthria. CT and MRI negative for new stroke.  ESRD: HD OP at [facility] in [place]. Awaiting HD orders. Plan for HD today to keep her on her schedule.  HTN/volume: BP controlled on cleviprex  gtt. Trying to wean down. Patient does have some LE edema. On room air.  Anemia of ESRD: Hgb 9.8 today. Unclear if she is receiving OP ESA. Awaiting orders from [facility].  Secondary HPTH: Corrected Ca 8.9. Add on phos. Not on binders.  Nutrition: Albumin  3.3. Adding protein supplements. Switched her to Renal diet with fluid restrictions.    Hersey Lorenzo, NP-C 05/02/2024, 1:02 PM  [employer]

## 2024-05-02 NOTE — Plan of Care (Signed)
  Problem: Ischemic Stroke/TIA Tissue Perfusion: Goal: Complications of ischemic stroke/TIA will be minimized Outcome: Progressing   Problem: Health Behavior/Discharge Planning: Goal: Goals will be collaboratively established with patient/family Outcome: Progressing   Problem: Self-Care: Goal: Ability to participate in self-care as condition permits will improve Outcome: Progressing Goal: Ability to communicate needs accurately will improve Outcome: Progressing   Problem: Nutrition: Goal: Risk of aspiration will decrease Outcome: Progressing Goal: Dietary intake will improve Outcome: Progressing   Problem: Tissue Perfusion: Goal: Adequacy of tissue perfusion will improve Outcome: Progressing   Problem: Clinical Measurements: Goal: Ability to maintain clinical measurements within normal limits will improve Outcome: Progressing

## 2024-05-02 NOTE — Progress Notes (Signed)
 SLP Cancellation Note  Patient Details Name: [name] MRN: [id number] DOB: 02/25/1961   Cancelled treatment:       Reason Eval/Treat Not Completed: Patient at procedure or test/unavailable;Other (comment) (at HD)  Jacqualine Mater, MA, CCC-SLP Speech Therapy

## 2024-05-02 NOTE — TOC Initial Note (Addendum)
 Transition of Care [facility]) - Initial/Assessment Note    Patient Details  Name: Terry Arroyo MRN: [id number] Date of Birth: 08-Mar-1961  [facility]) CM/SW Contact:    Jannice Mends, LCSW Phone Number: 05/02/2024, 11:04 AM  Clinical Narrative:                 11am-CSW received consult for possible SNF placement at time of discharge as patient is requesting SNF. CSW will send out referrals for review to see which facilities can transport to dialysis in [place] and provide bed offers as available. Insurance prior authorization will be required; unsure if insurance will cover at current level but will attempt once facility is chosen.   12:21 PM-CSW provided SNF bed offers to patient. She stated she prefers [facility] in [place]. CSW will begin insurance authorization process. Facility aware.   1:52 PM-Insurance approval received for [facility], Certification # [id number], effective 05/03/24-05/09/24.  Skilled Nursing Rehab Facilities-   [email or web address]   Ratings out of 5 stars (5 the highest)  Name Address  Phone # Quality Care Staffing Health Inspection Overall  [facility] [address], [place] [phone] 3 1 4 3   [facility] [address] [place] [phone] 5 2 4 5   [facility] [address], [facility] [phone] 2 1 1 1   [facility] [address], [place] [phone] 4 3 4 4   [facility]  [address], [place] [phone] 5 3 5 5   [facility] [address] [facility] [phone] 5 3 2 3   [facility] [address] [place] [phone] 5 1 2 2   [facility] [address], [place] [phone] 2 3 3 3   [address] ([facility]) [address], [place] [phone] 1 3 3 2   [facility] [address] [place], [place] [phone] 3 1 2 1   [facility]) [address], [place] [phone] 3 1 1  1   [facility] [address] [place] [phone] 3 3 4 4   [facility] [address] [place] [phone] 4 4 3 3   [facility] ([facility]) [address], [place] [phone] 1 1 4 2           [facility] [address] [place] [phone] 3 1 5 4   [facility] [address], [place] [phone] 4 1 1 1   [facility]  [address], [place] [phone] 2 3 2 2   [facility] [place] [address] [phone] 2 2 4 4   [facility] [address] [place] [address], [place] [phone] 2 2 3 3           [facility] [address], [place] [phone] 2 1 2 1   [facility] (No UHC) [address], [place] [phone] 5 4 5 5   [facility] [address], [facility] [phone] 3 4 2 2   [facility] [address] [place] [phone] 3 1 2 1   [facility] [address] [place]ne[place] [phone] 3 2 2 2   [facility] [address], [place] [phone] 1 3 3 2   [facility] [address], [place] [phone] 2 2 3 3   [facility] [address] [place], [place] [phone] 2 1 4 3   [facility] [address], [facility] [phone] 2 1 1 1   [facility] [address] [place], [place] [phone] 2 1 2 1   [facility]  [address] [place] [phone] 3 1 2 1           [facility] [address], [place] [phone] 4 1 2 1   [facility] [address] Albertine Alpha  [phone] 3 4 4 4   [facility] (No Humana) [address], [place] [phone] 3 2 4 4   [facility] [address], [place] [phone] 2 2 3 3   [facility] [place] [address] [place] [phone] 5 3 5 5   [facility] [address], [place] [phone] 2 1 1 1   [facility] [address] [place], [place] [phone] 3 5 5 5           [facility] [address] [place], [place] [phone] 5 4 5 5   [facility])  [address], [place] [phone] 1 1 2 1   [facility]) [address], [place] [phone]  2 4 4   [facility] [place] [address] [place] [phone] 3 5 5 5   [address] [place], [place] [phone] 4 2 2 2   [facility]) [address] [place] [phone] 1 1 3 1      Expected Discharge Plan: Skilled Nursing Facility Barriers to Discharge: Insurance Authorization, Continued Medical Work up   Patient Goals and CMS Choice Patient states their goals for this hospitalization and ongoing recovery are:: Rehab CMS Medicare.gov Compare Post Acute Care list provided to:: Patient Choice offered to / list presented to : Patient Irene ownership interest in  [facility].provided to:: Patient    Expected Discharge Plan and Services In-house Referral: Clinical Social Work   Post Acute Care Choice: Skilled Nursing Facility Living arrangements for the past 2 months: Single Family Home                                      Prior Living Arrangements/Services Living arrangements for the past 2 months: Single Family Home Lives with:: Self Patient language and need for interpreter reviewed:: Yes Do you feel safe going back to the place where you live?: Yes      Need for Family Participation in Patient Care: Yes (Comment) Care giver support system in place?: No (comment) Current home services: DME Criminal Activity/Legal Involvement Pertinent to Current Situation/Hospitalization: No - Comment as needed  Activities of Daily Living   ADL Screening (condition at time of admission) Independently performs ADLs?: Yes (appropriate for developmental age) Is the patient deaf or have difficulty hearing?: No Does the patient have difficulty seeing, even when wearing glasses/contacts?: No Does the patient have difficulty  concentrating, remembering, or making decisions?: No  Permission Sought/Granted Permission sought to share information with : Facility Industrial/product designer granted to share information with : Yes, Verbal Permission Granted     Permission granted to share info w AGENCY: SNFs        Emotional Assessment Appearance:: Appears stated age Attitude/Demeanor/Rapport: Engaged Affect (typically observed): Accepting Orientation: : Oriented to Self, Oriented to Place, Oriented to  Time, Oriented to Situation Alcohol  / Substance Use: Not Applicable Psych Involvement: No (comment)  Admission diagnosis:  TIA (transient ischemic attack) [G45.9] Closed nondisplaced fracture of neck of left radius, initial encounter [S52.135A] Internal carotid artery stenosis, right [I65.21] Patient Active Problem List   Diagnosis Date Noted   Internal carotid artery stenosis, right 05/01/2024   TIA (transient ischemic  attack) 04/29/2024   Carotid stenosis, bilateral 04/24/2024   History of CVA (cerebrovascular accident) 04/23/2024   Ambulatory dysfunction 12/08/2023   Gait instability 12/07/2023   Sinus bradycardia 12/07/2023   Shortness of breath 05/02/2023   Weakness    Elevated BUN    Nausea and vomiting 04/14/2022   Generalized weakness 04/14/2022   Dyslipidemia 04/14/2022   GERD without esophagitis 04/14/2022   Physical debility 02/13/2022   Cellulitis of left thigh 02/09/2022   Atherosclerosis of native arteries of extremity with intermittent claudication (HCC) 02/04/2022   Leg pain, anterior, left 02/04/2022   Angioedema 10/28/2021   Diabetic polyneuropathy associated with type 2 diabetes mellitus (HCC)    Hypokalemia 06/11/2021   COPD (chronic obstructive pulmonary disease) with emphysema (HCC) 02/09/2021   Chronic sphenoidal sinusitis 01/01/2021   Contact dermatitis and other eczema due to other specified agent 01/01/2021   Hydronephrosis, left 01/01/2021   Other diseases of nasal  cavity and sinuses(478.19) 01/01/2021   End-stage renal disease on hemodialysis (HCC)    Reactive thrombocytosis 11/08/2020   Nephrotic syndrome 11/04/2020   SOB (shortness of breath) 11/01/2020   CAD (coronary artery disease) 10/21/2020   Hyperlipidemia associated with type 2 diabetes mellitus (HCC) 10/21/2020   HFrEF (heart failure with reduced ejection fraction) (HCC)    Kidney hematoma 09/13/2020   Essential hypertension    Hematuria 09/12/2020   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) 09/03/2020   Blind left eye 09/03/2020   Brain aneurysm 09/03/2020   Esotropia 09/03/2020   Lesion of ulnar nerve 09/03/2020   Sensory hearing loss, bilateral 09/03/2020   Tear film insufficiency 09/03/2020   Health maintenance examination 06/27/2020   Secondary hyperparathyroidism of renal origin (HCC) 04/24/2020   Anemia in chronic kidney disease 03/10/2020   Calf tenderness    Type 2 diabetes mellitus with hyperlipidemia (HCC) 02/26/2020   Hyponatremia 02/26/2020   Proteinuria 02/26/2020   Hypoalbuminemia 02/26/2020   Elevated troponin 02/26/2020   Aortic atherosclerosis (HCC) 02/18/2020   Hyperglycemia    Hypertensive emergency    Myalgia due to statin 12/04/2019   Chest pain 11/23/2019   OSA (obstructive sleep apnea) 04/10/2019   Mild nonproliferative diabetic retinopathy of right eye associated with type 2 diabetes mellitus (HCC) 09/09/2017   Genital herpes 05/07/2017   History of DVT (deep vein thrombosis) 12/30/2016   Hx of CABG 2018   PVD (posterior vitreous detachment), right eye 01/09/2016   Diabetic peripheral angiopathy (HCC) 01/03/2016   Age-related nuclear cataract of both eyes 10/24/2015   Chronic nonintractable headache 10/02/2015   Dry eyes, bilateral 02/13/2015   Proptosis 12/18/2014   Iritis of right eye 12/03/2014   Depression 08/30/2014   PCP:  Jimmy Moulding, MD Pharmacy:   [facility] [place] - [address] [place] [address] Phone: [phone] Fax: [phone]  [facility] [address] [place] [address] Phone: [phone] Fax: [phone]     Social Drivers of Health (SDOH) Social History: SDOH Screenings   Food Insecurity: No Food Insecurity (04/29/2024)  Housing: Low Risk  (04/29/2024)  Transportation Needs: No Transportation Needs (04/29/2024)  Utilities: Not At Risk (04/29/2024)  Depression (PHQ2-9): Low Risk  (07/08/2022)  Financial Resource Strain: Low Risk  (10/13/2023)   [facility] from [facility]  Social Connections: Unknown (04/29/2024)  Tobacco Use: Medium Risk (05/01/2024)   SDOH Interventions:     Readmission Risk Interventions    05/02/2024   11:03 AM  10/29/2021   10:30 AM  Readmission Risk Prevention Plan  Transportation Screening Complete Complete  Medication Review Oceanographer) Complete Complete  PCP or Specialist appointment within 3-5 days of discharge Complete Complete  HRI or Home Care Consult Complete   SW Recovery Care/Counseling Consult Complete Complete  Palliative Care Screening Not Applicable Not Applicable  Skilled Nursing Facility Complete Not Applicable

## 2024-05-02 NOTE — [place] FL2 (Signed)
 [place]  MEDICAID FL2 LEVEL OF CARE FORM     IDENTIFICATION  Patient Name: Terry Arroyo Birthdate: 04/26/1961 Sex: female Admission Date (Current Location): 04/29/2024  [facility] and [place] Number:  Chiropodist and Address:  [facility] [place], [address] [place], [place] [address]      Provider Number: [id number]  Attending Physician Name and Address:  Lesa Rape, MD  Relative Name and Phone Number:       Current Level of Care: Hospital Recommended Level of Care: Skilled Nursing Facility Prior Approval Number:    Date Approved/Denied:   PASRR Number: [id number]  Discharge Plan: SNF    Current Diagnoses: Patient Active Problem List   Diagnosis Date Noted   Internal carotid artery stenosis, right 05/01/2024   TIA (transient ischemic attack) 04/29/2024   Carotid stenosis, bilateral 04/24/2024   History of CVA (cerebrovascular accident) 04/23/2024   Ambulatory dysfunction 12/08/2023   Gait instability 12/07/2023   Sinus bradycardia 12/07/2023   Shortness of breath 05/02/2023   Weakness    Elevated BUN    Nausea and vomiting 04/14/2022   Generalized weakness 04/14/2022   Dyslipidemia 04/14/2022   GERD without esophagitis 04/14/2022   Physical debility 02/13/2022   Cellulitis of left thigh 02/09/2022   Atherosclerosis of native arteries of extremity with intermittent claudication (HCC) 02/04/2022   Leg pain, anterior, left 02/04/2022   Angioedema 10/28/2021   Diabetic polyneuropathy associated with type 2 diabetes mellitus (HCC)    Hypokalemia 06/11/2021   COPD (chronic obstructive pulmonary disease) with emphysema (HCC) 02/09/2021   Chronic sphenoidal sinusitis 01/01/2021   Contact dermatitis and other eczema due to other specified agent 01/01/2021   Hydronephrosis, left 01/01/2021   Other diseases of nasal cavity and sinuses(478.19) 01/01/2021   End-stage renal disease on hemodialysis (HCC)    Reactive thrombocytosis  11/08/2020   Nephrotic syndrome 11/04/2020   SOB (shortness of breath) 11/01/2020   CAD (coronary artery disease) 10/21/2020   Hyperlipidemia associated with type 2 diabetes mellitus (HCC) 10/21/2020   HFrEF (heart failure with reduced ejection fraction) (HCC)    Kidney hematoma 09/13/2020   Essential hypertension    Hematuria 09/12/2020   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) 09/03/2020   Blind left eye 09/03/2020   Brain aneurysm 09/03/2020   Esotropia 09/03/2020   Lesion of ulnar nerve 09/03/2020   Sensory hearing loss, bilateral 09/03/2020   Tear film insufficiency 09/03/2020   Health maintenance examination 06/27/2020   Secondary hyperparathyroidism of renal origin (HCC) 04/24/2020   Anemia in chronic kidney disease 03/10/2020   Calf tenderness    Type 2 diabetes mellitus with hyperlipidemia (HCC) 02/26/2020   Hyponatremia 02/26/2020   Proteinuria 02/26/2020   Hypoalbuminemia 02/26/2020   Elevated troponin 02/26/2020   Aortic atherosclerosis (HCC) 02/18/2020   Hyperglycemia    Hypertensive emergency    Myalgia due to statin 12/04/2019   Chest pain 11/23/2019   OSA (obstructive sleep apnea) 04/10/2019   Mild nonproliferative diabetic retinopathy of right eye associated with type 2 diabetes mellitus (HCC) 09/09/2017   Genital herpes 05/07/2017   History of DVT (deep vein thrombosis) 12/30/2016   Hx of CABG 2018   PVD (posterior vitreous detachment), right eye 01/09/2016   Diabetic peripheral angiopathy (HCC) 01/03/2016   Age-related nuclear cataract of both eyes 10/24/2015   Chronic nonintractable headache 10/02/2015   Dry eyes, bilateral 02/13/2015   Proptosis 12/18/2014   Iritis of right eye 12/03/2014   Depression 08/30/2014  Orientation RESPIRATION BLADDER Height & Weight     Self, Time, Situation, Place  Normal Continent Weight: (P) 164 lb 3.9 oz (74.5 kg) Height:  (P) 5\' 6"  (167.6 cm)  BEHAVIORAL SYMPTOMS/MOOD NEUROLOGICAL  BOWEL NUTRITION STATUS      Continent Diet (see dc summary)  AMBULATORY STATUS COMMUNICATION OF NEEDS Skin   Limited Assist Verbally Normal                       Personal Care Assistance Level of Assistance  Bathing, Feeding, Dressing Bathing Assistance: Limited assistance Feeding assistance: Limited assistance Dressing Assistance: Limited assistance     Functional Limitations Info  Sight Sight Info: Impaired        SPECIAL CARE FACTORS FREQUENCY  PT (By licensed PT), OT (By licensed OT)     PT Frequency: 5x/week OT Frequency: 5x/week            Contractures Contractures Info: Not present    Additional Factors Info  Code Status, Allergies, Insulin  Sliding Scale Code Status Info: Full Allergies Info: Hydralazine , Kiwi Extract, Lisinopril , Shellfish Allergy, Betadine (Povidone Iodine ), Septra (Sulfamethoxazole-trimethoprim), Metformin, Tape   Insulin  Sliding Scale Info: See dc summary       Current Medications (05/02/2024):  This is the current hospital active medication list Current Facility-Administered Medications  Medication Dose Route Frequency Provider Last Rate Last Admin   acetaminophen  (TYLENOL ) tablet 650 mg  650 mg Oral Q4H PRN Deveshwar, Sanjeev, MD   650 mg at 05/02/24 9147   Or   acetaminophen  (TYLENOL ) 160 MG/5ML solution 650 mg  650 mg Per Tube Q4H PRN Deveshwar, Sanjeev, MD       Or   acetaminophen  (TYLENOL ) suppository 650 mg  650 mg Rectal Q4H PRN Deveshwar, Sanjeev, MD       albuterol  (PROVENTIL ) (2.5 MG/3ML) 0.083% nebulizer solution 3 mL  3 mL Inhalation Q6H PRN Johnetta Nab, MD       alteplase  (CATHFLO ACTIVASE ) injection 2 mg  2 mg Intracatheter Once PRN Chucky Craver, MD       anticoagulant sodium citrate  solution 5 mL  5 mL Intracatheter PRN Chucky Craver, MD       aspirin  chewable tablet 81 mg  81 mg Oral Daily Deveshwar, Sanjeev, MD   81 mg at 05/02/24 8295   Or   aspirin  chewable tablet 81 mg  81 mg Per Tube Daily  Deveshwar, Sanjeev, MD       atorvastatin  (LIPITOR ) tablet 80 mg  80 mg Oral q1800 Melvin, Alexander B, MD   80 mg at 05/01/24 2123   Chlorhexidine  Gluconate Cloth 2 % PADS 6 each  6 each Topical Q0600 Chucky Craver, MD   6 each at 05/01/24 0629   Chlorhexidine  Gluconate Cloth 2 % PADS 6 each  6 each Topical Q0600 Rubbie Cornwall, NP   6 each at 05/02/24 6213   feeding supplement (NEPRO CARB STEADY) liquid 237 mL  237 mL Oral PRN Chucky Craver, MD       gabapentin  (NEURONTIN ) capsule 300 mg  300 mg Oral QHS Consuelo Denmark, MD       heparin  injection 1,000 Units  1,000 Units Intracatheter PRN Chucky Craver, MD       heparin  injection 2,000 Units  2,000 Units Dialysis Once in dialysis Chucky Craver, MD       heparin  injection 2,000 Units  2,000 Units Dialysis PRN Chucky Craver, MD       heparin  injection 5,000 Units  5,000  Units Subcutaneous Q8H Melvin, Alexander B, MD   5,000 Units at 05/02/24 0981   insulin  aspart (novoLOG ) injection 0-15 Units  0-15 Units Subcutaneous TID WC Melvin, Alexander B, MD   3 Units at 05/01/24 1914   iohexol  (OMNIPAQUE ) 350 MG/ML injection 100 mL  100 mL Intravenous Once PRN Johnetta Nab, MD       lidocaine  (PF) (XYLOCAINE ) 1 % injection 5 mL  5 mL Intradermal PRN Chucky Craver, MD       lidocaine -prilocaine  (EMLA ) cream 1 Application  1 Application Topical PRN Chucky Craver, MD       methocarbamol  (ROBAXIN ) tablet 750 mg  750 mg Oral Q6H PRN Kc, Ramesh, MD       mirtazapine  (REMERON ) tablet 30 mg  30 mg Oral QHS Johnetta Nab, MD   30 mg at 05/01/24 2200   multivitamin (RENA-VIT) tablet 1 tablet  1 tablet Oral Daily Lesa Rape, MD   1 tablet at 05/02/24 7829   ondansetron  (ZOFRAN ) injection 4 mg  4 mg Intravenous Q6H PRN Augustin Leber, MD   4 mg at 05/01/24 2241   Oral care mouth rinse  15 mL Mouth Rinse PRN Deveshwar, Sanjeev, MD       oxyCODONE -acetaminophen  (PERCOCET/ROXICET) 5-325 MG per tablet 1 tablet  1 tablet Oral Q4H PRN  Lehner, Erin C, NP       oxyCODONE -acetaminophen  (PERCOCET/ROXICET) 5-325 MG per tablet 2 tablet  2 tablet Oral Q4H PRN Lehner, Erin C, NP       pentafluoroprop-tetrafluoroeth (GEBAUERS) aerosol 1 Application  1 Application Topical PRN Chucky Craver, MD       senna-docusate (Senokot-S) tablet 1 tablet  1 tablet Oral QHS PRN Melvin, Alexander B, MD       ticagrelor  (BRILINTA ) tablet 90 mg  90 mg Oral BID Deveshwar, Sanjeev, MD   90 mg at 05/02/24 5621   Or   ticagrelor  (BRILINTA ) tablet 90 mg  90 mg Per Tube BID Deveshwar, Sanjeev, MD       topiramate  (TOPAMAX ) tablet 50 mg  50 mg Oral Daily Consuelo Denmark, MD       traZODone  (DESYREL ) tablet 25 mg  25 mg Oral QHS Melvin, Alexander B, MD   25 mg at 05/01/24 2159   trimethobenzamide (TIGAN) injection 200 mg  200 mg Intramuscular Once PRN Rathore, Vasundhra, MD         Discharge Medications: Please see discharge summary for a list of discharge medications.  Relevant Imaging Results:  Relevant Lab Results:   Additional Information [id number]. Requires OP Dialysis at [facility] MWF 10:15 arrival for 10:25 chair time  Jamison Yuhasz S Raigen Jagielski, LCSW

## 2024-05-03 DIAGNOSIS — G459 Transient cerebral ischemic attack, unspecified: Secondary | ICD-10-CM | POA: Diagnosis not present

## 2024-05-03 LAB — BASIC METABOLIC PANEL WITH GFR
Anion gap: 17 — ABNORMAL HIGH (ref 5–15)
BUN: 29 mg/dL — ABNORMAL HIGH (ref 8–23)
CO2: 23 mmol/L (ref 22–32)
Calcium: 9.1 mg/dL (ref 8.9–10.3)
Chloride: 92 mmol/L — ABNORMAL LOW (ref 98–111)
Creatinine, Ser: 6.2 mg/dL — ABNORMAL HIGH (ref 0.44–1.00)
GFR, Estimated: 7 mL/min — ABNORMAL LOW (ref >60)
Glucose, Bld: 136 mg/dL — ABNORMAL HIGH (ref 70–99)
Potassium: 4.3 mmol/L (ref 3.5–5.1)
Sodium: 132 mmol/L — ABNORMAL LOW (ref 135–145)

## 2024-05-03 LAB — CBC
HCT: 31.4 % — ABNORMAL LOW (ref 36.0–46.0)
Hemoglobin: 10.3 g/dL — ABNORMAL LOW (ref 12.0–15.0)
MCH: 29.3 pg (ref 26.0–34.0)
MCHC: 32.8 g/dL (ref 30.0–36.0)
MCV: 89.5 fL (ref 80.0–100.0)
Platelets: 316 10*3/uL (ref 150–400)
RBC: 3.51 MIL/uL — ABNORMAL LOW (ref 3.87–5.11)
RDW: 14.2 % (ref 11.5–15.5)
WBC: 9 10*3/uL (ref 4.0–10.5)
nRBC: 0 % (ref 0.0–0.2)

## 2024-05-03 LAB — POCT ACTIVATED CLOTTING TIME: Activated Clotting Time: 153 s

## 2024-05-03 LAB — GLUCOSE, CAPILLARY: Glucose-Capillary: 140 mg/dL — ABNORMAL HIGH (ref 70–99)

## 2024-05-03 LAB — HEPATITIS B SURFACE ANTIGEN: Hepatitis B Surface Ag: NONREACTIVE

## 2024-05-03 MED ORDER — SENNOSIDES-DOCUSATE SODIUM 8.6-50 MG PO TABS: 1.0000 | ORAL_TABLET | Freq: Every evening | ORAL | Status: AC | PRN

## 2024-05-03 MED ORDER — OXYCODONE-ACETAMINOPHEN 5-325 MG PO TABS: 1.0000 | ORAL_TABLET | Freq: Four times a day (QID) | ORAL | 0 refills | Status: DC | PRN

## 2024-05-03 MED ORDER — METHOCARBAMOL 500 MG PO TABS: 750.0000 mg | ORAL_TABLET | Freq: Four times a day (QID) | ORAL | Status: AC | PRN

## 2024-05-03 NOTE — Discharge Summary (Signed)
 Physician Discharge Summary  JABRIA DOLLEY GEX:[id number] DOB: July 05, 1961 DOA: 04/29/2024  PCP: Jimmy Moulding, MD  Admit date: 04/29/2024 Discharge date: 05/03/2024 Recommendations for Outpatient Follow-up:  Follow up with PCP in 1 weeks-call for appointment Please obtain BMP/CBC in one week  Discharge Dispo: SNF Discharge Condition: Stable Code Status:   Code Status: Full Code Diet recommendation:  Diet Order             Diet renal with fluid restriction Fluid restriction: 1200 mL Fluid; Room service appropriate? Yes; Fluid consistency: Thin  Diet effective now                    Brief/Interim Summary: 63 y.o. female with medical history significant of diabetes, neuropathy, hypertension, hyperlipidemia, GERD, stroke, CAD status post CABG, anemia, chronic combined systolic and diastolic CHF, ESRD on HD, left eye blindness, brain aneurysm, benign pituitary neoplasm, DVT, depression, cataract, COPD, OSA presenting with slurred speech, left-sided weakness and some left arm pain.   Recent admitted 5/5-5/10 on neurology service for acute ischemic MCA  secondary to left ICA stenosis treated with stenting and DAPT.  MRI following intervention did not show infarct so the stroke was aborted by the intervention. In the ED :Vital signs in the ED notable for blood pressure in the 110s-160s systolic, heart rate in the 50s-60s.  Lab creatinine stable at 8.5, glucose 160, albumin  3.4.  CBC with hemoglobin stable at 10.2.  PT, PTT, INR within normal limits.  Ethanol level negative.  CT head>>no acute abnormality. MRI brain >>motion degradation but no acute abnormality. MRA of the head and neck>> showed similar carotid artery disease with greater than 70% stenosis on the right side and status post intervention on the left.  Also noted was stable aneurysm. Left elbow x-ray showed questionable radial neck fracture with moderate elbow effusion and possible evidence of extravasation of contrast at  the soft tissue near the ulna. Patient received Dilaudid  in the ED.  Neurology was consulted and evaluated the patient-possible TIA/symptomatic right ICA stenosis.  With modified TIA workup recommended given recent workup for stroke Underwent further evaluation due to concern for right carotid artery stenosis underwent angiogram-no intervention needed. Patient was monitor, seen by orthopedic for left elbow effusion, which was improving Seen by PT OT recommending skilled nursing facility and at this time stable for discharge  Subjective; Seen and examined this morning  No new complaints  Left elbow hand pain better  She is ready for discharge to skilled nursing facility   Discharge diagnosis:  TIA Recent CVA and admission after a left ICA stenosis leading to MCA ischemia s/p stent Right carotid artery stenosis Cerebral aneurysm: Admitted after episode of tiredness, slurred speech, left arm weakness and left arm pain. Imaging findings unremarkable, neurology consulted recommended modified TIA workup  Imaging obtained, showing right internal carotid artery stenosis- Neuro IR and neurology on board 2D echo EF 35-40% G2 DD.  LDL 75 A1c 8.6. Continue aspirin  81, Brilinta , Lipitor  80. Repeat CT brain MRI brain no acute finding. S/p cerebral angiogram with intervention 5/13-showed 45 to 50% right proximal ICA stenosis. Overall remains stable, neuro intact, and stable for discharge.   Left elbow pain/effusion Ortho input appreciated, CT elbow shows moderate size elbow joint effusion without evidence of erosive disease or intra-articular air-disease nonspecific and could be degenerative or inflammatory, dorsal subcutaneous edema in the distal upper arm extending posterior to the elbow small amount of urine in the antecubital fossa no acute osseous finding or  evidence of osteomyelitis Continue sling, discussed with Ortho PA -no indication for fluid aspiration  Improving.  Continue supportive care,  elevate.  ESRD on HD MWF Nephrology consulted for HD continue HD per schedule   Hypertension BP remains controlled, has not needed antihypertensive continue to hold   Hyperlipidemia Continue atorvastatin  80 mg   CAD s/p CABG: On ASA, Brilinta , atorvastatin    Anemia of chronic kidney disease Hb stable at 10 g baseline.  Monitor as below Recent Labs  Lab 04/29/24 1302 04/30/24 0700 05/01/24 0612 05/02/24 0459 05/03/24 0629  HGB 10.5* 10.1* 10.9* 9.8* 10.3*  HCT 31.0* 31.3* 34.4* 30.3* 31.4*    Chronic combined systolic and diastolic CHF Last echo was 4 days PTA EF 35-40%, G2 DD, mild reduced RV function.  MR, TR. Manage volume with HD.Holding carvedilol    DM A1c 8.6 blood sugar fluctuating .  Currently stable Recent Labs  Lab 05/02/24 0734 05/02/24 1121 05/02/24 1925 05/02/24 2111 05/03/24 0627  GLUCAP 89 142* 73 166* 140*     History of brain aneurysm Stable on imaging   Depression Mood stable on mirtazapine  and trazodone    COPD Continue home albuterol    OSA Continue home CPAP   History of cataracts History of DVT Left eye blindness Noted.  Continue supportive care,   Discharge Exam: Vitals:   05/03/24 0331 05/03/24 0810  BP: 121/66 107/73  Pulse: 66 67  Resp: 18 18  Temp: 98 F (36.7 C) 98.1 F (36.7 C)  SpO2: 99% 98%   General: Pt is alert, awake, not in acute distress Cardiovascular: RRR, S1/S2 +, no rubs, no gallops Respiratory: CTA bilaterally, no wheezing, no rhonchi Abdominal: Soft, NT, ND, bowel sounds + Extremities: no edema, no cyanosis  Discharge Instructions  Discharge Instructions     Ambulatory referral to Neurology   Complete by: As directed    Follow up with stroke clinic NP at [facility] in about 4-6 weeks. Thanks.   Discharge instructions   Complete by: As directed    Your antihypertensives are on hold reassess to see if need to be resumed as outpatient per nephrology or PCP  Please call call MD or return to ER for  similar or worsening recurring problem that brought you to hospital or if any fever,nausea/vomiting,abdominal pain, uncontrolled pain, chest pain,  shortness of breath or any other alarming symptoms.  Please follow-up your doctor as instructed in a week time and call the office for appointment.  Please avoid alcohol , smoking, or any other illicit substance and maintain healthy habits including taking your regular medications as prescribed.  You were cared for by a hospitalist during your hospital stay. If you have any questions about your discharge medications or the care you received while you were in the hospital after you are discharged, you can call the unit and ask to speak with the hospitalist on call if the hospitalist that took care of you is not available.  Once you are discharged, your primary care physician will handle any further medical issues. Please note that NO REFILLS for any discharge medications will be authorized once you are discharged, as it is imperative that you return to your primary care physician (or establish a relationship with a primary care physician if you do not have one) for your aftercare needs so that they can reassess your need for medications and monitor your lab values   Increase activity slowly   Complete by: As directed       Allergies as of 05/03/2024  Reactions   Hydralazine  Hives, Shortness Of Breath, Swelling, Rash   Body aches, also   Kiwi Extract Hives, Swelling, Rash, Other (See Comments)   Mouth swells    Lisinopril  Swelling, Other (See Comments)   Angioedema   Shellfish Allergy Anaphylaxis, Other (See Comments)   Shrimp/lobster    Betadine [povidone Iodine ] Hives, Rash   Septra [sulfamethoxazole-trimethoprim] Other (See Comments)   Reaction not stated   Metformin Diarrhea, Other (See Comments)   GI Intolerance   Tape Itching   Use paper tape whenever possible        Medication List     STOP taking these medications     amLODipine  10 MG tablet Commonly known as: NORVASC    carvedilol  25 MG tablet Commonly known as: COREG        TAKE these medications    acetaminophen  500 MG tablet Commonly known as: TYLENOL  Take 1,000 mg by mouth every 6 (six) hours as needed for mild pain (pain score 1-3) or moderate pain (pain score 4-6).   albuterol  108 (90 Base) MCG/ACT inhaler Commonly known as: VENTOLIN  HFA Inhale 2 puffs into the lungs every 6 (six) hours as needed for wheezing or shortness of breath.   aspirin  EC 81 MG tablet Take 1 tablet (81 mg total) by mouth daily.   atorvastatin  80 MG tablet Commonly known as: LIPITOR  Take 1 tablet (80 mg total) by mouth daily at 6 PM.   Brilinta  90 MG Tabs tablet Generic drug: ticagrelor  Take 1 tablet (90 mg total) by mouth 2 (two) times daily.   cyanocobalamin  1000 MCG tablet Commonly known as: VITAMIN B12 Take 1,000 mcg by mouth daily.   ezetimibe  10 MG tablet Commonly known as: ZETIA  Take 1 tablet (10 mg total) by mouth daily.   gabapentin  300 MG capsule Commonly known as: NEURONTIN  Take 300 mg by mouth at bedtime.   lidocaine -prilocaine  cream Commonly known as: EMLA  Apply 1 Application topically every Monday, Wednesday, and Friday with hemodialysis.   methocarbamol  500 MG tablet Commonly known as: ROBAXIN  Take 1.5 tablets (750 mg total) by mouth every 6 (six) hours as needed for muscle spasms.   mirtazapine  30 MG tablet Commonly known as: REMERON  Take 30 mg by mouth at bedtime.   NovoLOG  Mix 70/30 FlexPen (70-30) 100 UNIT/ML FlexPen Generic drug: insulin  aspart protamine - aspart Inject 7-15 Units into the skin 3 (three) times daily with meals.   oxyCODONE -acetaminophen  5-325 MG tablet Commonly known as: PERCOCET/ROXICET Take 1 tablet by mouth every 6 (six) hours as needed for up to 4 doses for severe pain (pain score 7-10).   Rena-Vite Rx 1 MG Tabs Take 1 tablet by mouth daily.   senna-docusate 8.6-50 MG tablet Commonly known as:  Senokot-S Take 1 tablet by mouth at bedtime as needed for mild constipation or moderate constipation.   topiramate  50 MG tablet Commonly known as: TOPAMAX  Take 50 mg by mouth daily.   traZODone  50 MG tablet Commonly known as: DESYREL  Take 0.5 tablets (25 mg total) by mouth at bedtime. What changed: how much to take        Contact information for follow-up providers     [facility]. Schedule an appointment as soon as possible for a visit in 1 month(s).   Specialty: Neurology Why: stroke clinic Contact information: [address] [place]  [address] [phone]        Jimmy Moulding, MD Follow up in 1 week(s).   Specialty: Internal Medicine Contact information:  [address] [place] [address] [phone]              Contact information for after-discharge care     Destination     [facility] Preferred SNF .   Service: Skilled Nursing Contact information: [address] [place]  [address] [phone]                    Allergies  Allergen Reactions   Hydralazine  Hives, Shortness Of Breath, Swelling and Rash    Body aches, also    Kiwi Extract Hives, Swelling, Rash and Other (See Comments)    Mouth swells    Lisinopril  Swelling and Other (See Comments)    Angioedema   Shellfish Allergy Anaphylaxis and Other (See Comments)    Shrimp/lobster    Betadine [Povidone Iodine ] Hives and Rash   Septra [Sulfamethoxazole-Trimethoprim] Other (See Comments)    Reaction not stated   Metformin Diarrhea and Other (See Comments)    GI Intolerance   Tape Itching    Use paper tape whenever possible    The results of significant diagnostics from this hospitalization (including imaging, microbiology, ancillary and laboratory) are listed below for reference.    Microbiology: Recent Results (from the past 240 hours)   Resp panel by RT-PCR (RSV, Flu A&B, Covid) Anterior Nasal Swab     Status: None   Collection Time: 04/23/24 12:55 PM   Specimen: Anterior Nasal Swab  Result Value Ref Range Status   SARS Coronavirus 2 by RT PCR NEGATIVE NEGATIVE Final    Comment: (NOTE) SARS-CoV-2 target nucleic acids are NOT DETECTED.  The SARS-CoV-2 RNA is generally detectable in upper respiratory specimens during the acute phase of infection. The lowest concentration of SARS-CoV-2 viral copies this assay can detect is 138 copies/mL. A negative result does not preclude SARS-Cov-2 infection and should not be used as the sole basis for treatment or other patient management decisions. A negative result may occur with  improper specimen collection/handling, submission of specimen other than nasopharyngeal swab, presence of viral mutation(s) within the areas targeted by this assay, and inadequate number of viral copies(<138 copies/mL). A negative result must be combined with clinical observations, patient history, and epidemiological information. The expected result is Negative.  Fact Sheet for Patients:  BloggerCourse.com  Fact Sheet for Healthcare Providers:  SeriousBroker.it  This test is no t yet approved or cleared by the [place]  FDA and  has been authorized for detection and/or diagnosis of SARS-CoV-2 by FDA under an Emergency Use Authorization (EUA). This EUA will remain  in effect (meaning this test can be used) for the duration of the COVID-19 declaration under Section 564(b)(1) of the Act, 21 U.S.C.section 360bbb-3(b)(1), unless the authorization is terminated  or revoked sooner.       Influenza A by PCR NEGATIVE NEGATIVE Final   Influenza B by PCR NEGATIVE NEGATIVE Final    Comment: (NOTE) The Xpert Xpress SARS-CoV-2/FLU/RSV plus assay is intended as an aid in the diagnosis of influenza from Nasopharyngeal swab specimens and should not be used  as a sole basis for treatment. Nasal washings and aspirates are unacceptable for Xpert Xpress SARS-CoV-2/FLU/RSV testing.  Fact Sheet for Patients: BloggerCourse.com  Fact Sheet for Healthcare Providers: SeriousBroker.it  This test is not yet approved or cleared by the [place]  FDA and has been authorized for detection and/or diagnosis of SARS-CoV-2 by FDA under an Emergency Use Authorization (EUA). This EUA will  remain in effect (meaning this test can be used) for the duration of the COVID-19 declaration under Section 564(b)(1) of the Act, 21 U.S.C. section 360bbb-3(b)(1), unless the authorization is terminated or revoked.     Resp Syncytial Virus by PCR NEGATIVE NEGATIVE Final    Comment: (NOTE) Fact Sheet for Patients: BloggerCourse.com  Fact Sheet for Healthcare Providers: SeriousBroker.it  This test is not yet approved or cleared by the [place]  FDA and has been authorized for detection and/or diagnosis of SARS-CoV-2 by FDA under an Emergency Use Authorization (EUA). This EUA will remain in effect (meaning this test can be used) for the duration of the COVID-19 declaration under Section 564(b)(1) of the Act, 21 U.S.C. section 360bbb-3(b)(1), unless the authorization is terminated or revoked.  Performed at [facility], [address] [place], [place] [address]   MRSA Next Gen by PCR, Nasal     Status: None   Collection Time: 04/23/24  4:13 PM   Specimen: Nasal Mucosa; Nasal Swab  Result Value Ref Range Status   MRSA by PCR Next Gen NOT DETECTED NOT DETECTED Final    Comment: (NOTE) The GeneXpert MRSA Assay (FDA approved for NASAL specimens only), is one component of a comprehensive MRSA colonization surveillance program. It is not intended to diagnose MRSA infection nor to guide or monitor treatment for MRSA infections. Test performance is not FDA  approved in patients less than 74 years old. Performed at [facility], [address], [place], [place] [address]   MRSA Next Gen by PCR, Nasal     Status: None   Collection Time: 05/01/24  2:40 PM   Specimen: Nasal Mucosa; Nasal Swab  Result Value Ref Range Status   MRSA by PCR Next Gen NOT DETECTED NOT DETECTED Final    Comment: (NOTE) The GeneXpert MRSA Assay (FDA approved for NASAL specimens only), is one component of a comprehensive MRSA colonization surveillance program. It is not intended to diagnose MRSA infection nor to guide or monitor treatment for MRSA infections. Test performance is not FDA approved in patients less than 71 years old. Performed at [facility], [address], [place], [place] [address]     Procedures/Studies: IR ANGIO INTRA EXTRACRAN SEL COM CAROTID INNOMINATE UNI R MOD SED Result Date: 05/03/2024 CLINICAL DATA:  Left-sided numbness, tingling and weakness. CT angiogram of the head and neck suspicious of high-grade stenosis of the proximal right ICA. EXAM: IR ANGIO INTRA EXTRACRAN SEL COM CAROTID INNOMINATE UNI RIGHT MOD SED COMPARISON:  CT angiogram of the head and neck of Apr 23, 2024. MEDICATIONS: Heparin  none units IV. No antibiotic was administered within 1 hour of the procedure. ANESTHESIA/SEDATION: General anesthesia. CONTRAST:  Omnipaque  300 approximately 20 mL. FLUOROSCOPY TIME:  Fluoroscopy Time: 4 minutes 0 seconds (288 mGy). COMPLICATIONS: None immediate. TECHNIQUE: Informed written consent was obtained from the patient after a thorough discussion of the procedural risks, benefits and alternatives. All questions were addressed. Maximal Sterile Barrier Technique was utilized including caps, mask, sterile gowns, sterile gloves, sterile drape, hand hygiene and skin antiseptic. A timeout was performed prior to the initiation of the procedure. The right groin was prepped and draped in the usual sterile fashion. Thereafter using modified Seldinger  technique, transfemoral access into the right common femoral artery was obtained without difficulty. Over an 0.035 inch guidewire, a 9 French 15 cm Pinnacle sheath was inserted. Through this, and also over an 0.035 inch guidewire, a 5 [place] JB 1 catheter was advanced to  the aortic arch region and selectively positioned in the right common carotid artery. FINDINGS: Right common carotid arteriogram demonstrates mild stenosis at the origin of the right external carotid artery. Its branches opacified normally. The right internal carotid artery at the bulb origin has mild stenosis. Just distal to the bulb there is a smooth stenosis without evidence of intraluminal filling defects, or of irregularities. This is approximately 50%. Distal to this there is a double U shaped configuration of the right internal carotid artery at the junction of the middle and the proximal cervical right ICA. No evidence of kinking noted. Distal to this the vessel opacifies widely to the cranial skull base. Approximately 50% stenosis is seen of the proximal cavernous right ICA on the AP oblique and lateral projections. Distal to this the distal cavernous, and the supraclinoid right ICA are widely patent. The right middle cerebral artery and the right anterior cerebral artery opacify into the capillary and venous phases. Unopacified blood is seen in the right anterior cerebral artery from the contralateral left ICA via the anterior communicating artery. Given the above findings, no endovascular treatment was rendered. An 8 French Angio-Seal closure device was used for hemostasis at the right groin puncture site. Distal pulses remained Dopplerable unchanged from prior to the procedure. The patient's general anesthesia was reversed. Patient was then extubated without any difficulty. Upon recovery, the patient returned to her pre angiographic neurological status. The patient was communicative and moving all fours as previously. She denied any  headaches, nausea, vomiting or left-sided weakness. IMPRESSION: Approximately 50% stenosis of the right internal carotid artery proximally secondary to a smooth circumferential atherosclerotic plaque. Approximately 50% stenosis of the proximal cavernous right ICA probably related to intracranial arteriosclerosis. PLAN: As per referring MD. Electronically Signed   By: Luellen Sages M.D.   On: 05/03/2024 09:01   VAS US  UPPER EXTREMITY VENOUS DUPLEX Result Date: 05/01/2024 UPPER VENOUS STUDY  Patient Name:  VERNELLA STUDENT  Date of Exam:   05/01/2024 Medical Rec #: [id number]        Accession #:    [id number] Date of Birth: July 27, 1961         Patient Gender: F Patient Age:   56 years Exam Location:  [facility] Procedure:      VAS US  UPPER EXTREMITY VENOUS DUPLEX Referring Phys: Francies Inch --------------------------------------------------------------------------------  Indications: Pain Risk Factors: None identified. Comparison Study: No prior studies. Performing Technologist: Lerry Ransom RVT  Examination Guidelines: A complete evaluation includes B-mode imaging, spectral Doppler, color Doppler, and power Doppler as needed of all accessible portions of each vessel. Bilateral testing is considered an integral part of a complete examination. Limited examinations for reoccurring indications may be performed as noted.  Right Findings: +----------+------------+---------+-----------+----------+-------+ RIGHT     CompressiblePhasicitySpontaneousPropertiesSummary +----------+------------+---------+-----------+----------+-------+ Subclavian    Full       Yes       Yes                      +----------+------------+---------+-----------+----------+-------+  Left Findings: +----------+------------+---------+-----------+----------+-------+ LEFT      CompressiblePhasicitySpontaneousPropertiesSummary +----------+------------+---------+-----------+----------+-------+ IJV           Full       Yes        Yes                      +----------+------------+---------+-----------+----------+-------+ Subclavian               Yes  Yes                      +----------+------------+---------+-----------+----------+-------+ Axillary      Full       Yes       Yes                      +----------+------------+---------+-----------+----------+-------+ Brachial      Full                                          +----------+------------+---------+-----------+----------+-------+ Radial        Full                                          +----------+------------+---------+-----------+----------+-------+ Ulnar         Full                                          +----------+------------+---------+-----------+----------+-------+ Cephalic      Full                                          +----------+------------+---------+-----------+----------+-------+ Basilic       Full                                          +----------+------------+---------+-----------+----------+-------+  Summary:  Right: No evidence of thrombosis in the subclavian.  Left: No evidence of deep vein thrombosis in the upper extremity. No evidence of superficial vein thrombosis in the upper extremity.  *See table(s) above for measurements and observations.  Diagnosing physician: Delaney Fearing Electronically signed by Delaney Fearing on 05/01/2024 at 4:07:03 PM.    Final    MR BRAIN WO CONTRAST Result Date: 05/01/2024 CLINICAL DATA:  63 year old female code stroke presentation this morning. Dialysis patient. Severe atherosclerosis in the head and neck on CTA this month. EXAM: MRI HEAD WITHOUT CONTRAST LIMITED TECHNIQUE: Multiplanar, multiecho pulse sequences of the brain and surrounding structures were obtained without intravenous contrast. COMPARISON:  Plain head CT 0847 hours. CT head, CTA, CTP 04/23/24. FINDINGS: Neurology requests a study limited to DWI, sagittal T1 and axial FLAIR imaging. No  restricted diffusion to suggest acute infarction. No midline shift, mass effect, evidence of mass lesion, ventriculomegaly, extra-axial collection or acute intracranial hemorrhage. Cervicomedullary junction and pituitary are within normal limits. Axial FLAIR imaging demonstrates scattered, generally small hyperintense foci in the bilateral cerebral white matter. The extent is mild to moderate for age. No cortical encephalomalacia identified. Incidental midline nasopharyngeal retention cysts (Tornwaldt cyst, normal variant). Negative for age visible cervical spine. IMPRESSION: Negative for acute infarct. No evidence of acute intracranial abnormality on limited exam. Electronically Signed   By: Marlise Simpers M.D.   On: 05/01/2024 10:26   CT HEAD CODE STROKE WO CONTRAST Result Date: 05/01/2024 CLINICAL DATA:  Code stroke. Neuro deficit, concern for stroke, left arm weakness. EXAM: CT HEAD WITHOUT CONTRAST TECHNIQUE: Contiguous axial images were obtained from the base of the  skull through the vertex without intravenous contrast. RADIATION DOSE REDUCTION: This exam was performed according to the departmental dose-optimization program which includes automated exposure control, adjustment of the mA and/or kV according to patient size and/or use of iterative reconstruction technique. COMPARISON:  CT head and MRI head 04/29/2024. FINDINGS: Brain: No acute intracranial hemorrhage. No CT evidence of acute infarct. No edema, mass effect, or midline shift. The basilar cisterns are patent. Ventricles: The ventricles are normal. Vascular: Atherosclerotic calcifications of the carotid siphons and intracranial vertebral arteries. No hyperdense vessel. Skull: No acute or aggressive finding. Orbits: Right lens replacement.  Orbits otherwise unremarkable. Sinuses: Mild mucosal thickening in the left posterior ethmoid air cells. Postsurgical changes of the sphenoid sinuses. Other: Trace fluid in the right mastoid air cells. ASPECTS  [facility] Stroke Program Early CT Score) - Ganglionic level infarction (caudate, lentiform nuclei, internal capsule, insula, M1-M3 cortex): 7 - Supraganglionic infarction (M4-M6 cortex): 3 Total score (0-10 with 10 being normal): 10 IMPRESSION: 1. No CT evidence of acute intracranial abnormality. 2. ASPECTS is 10 These results were communicated to Dr. Christiane Cowing at 9:06 am on 05/01/2024 by text page via the [facility] messaging system. Electronically Signed   By: Denny Flack M.D.   On: 05/01/2024 09:07   US  LT UPPER EXTREM LTD SOFT TISSUE NON VASCULAR Result Date: 04/30/2024 CLINICAL DATA:  Elbow pain and swelling.  Joint effusion. EXAM: ULTRASOUND LEFT UPPER EXTREMITY LIMITED TECHNIQUE: Ultrasound examination of the upper extremity soft tissues was performed in the area of clinical concern. COMPARISON:  Elbow radiographs 04/29/2024 and 04/30/2024. CT 04/30/2024. FINDINGS: Examination was targeted to the posterior aspect of the left elbow. There is generalized subcutaneous edema, as seen on CT. A focal fluid collection posteriorly measures approximately 3.6 x 1.4 x 2.4 cm. This has a deep location adjacent to bone, and as correlated with same day CT likely represents the known elbow joint effusion. No other focal periarticular fluid collections are identified. IMPRESSION: Focal fluid collection posterior to the left elbow likely represents the known elbow joint effusion. No other focal periarticular fluid collections identified. If there is concern of septic arthritis, consider further evaluation with MRI with and without contrast or arthrocentesis. Electronically Signed   By: Elmon Hagedorn M.D.   On: 04/30/2024 21:44   CT ELBOW LEFT WO CONTRAST Result Date: 04/30/2024 CLINICAL DATA:  Bone lesion, elbow, incidental Elbow pain and swelling with joint effusion on radiographs. EXAM: CT OF THE UPPER LEFT EXTREMITY WITHOUT CONTRAST TECHNIQUE: Multidetector CT imaging of the left elbow was performed according to the standard  protocol. RADIATION DOSE REDUCTION: This exam was performed according to the departmental dose-optimization program which includes automated exposure control, adjustment of the mA and/or kV according to patient size and/or use of iterative reconstruction technique. COMPARISON:  Elbow radiographs 04/29/2024 and 04/30/2024. Ultrasound 04/30/2024. FINDINGS: Bones/Joint/Cartilage No evidence of acute fracture, dislocation or bone destruction. Mild spurring of the coronoid process. There is a moderate-sized elbow joint effusion without evidence of erosive disease or intra-articular air. Ligaments Suboptimally assessed by CT. Muscles and Tendons No intramuscular fluid collection or focal atrophy identified on noncontrast imaging. As evaluated by CT, the biceps and triceps tendons appear intact. Soft tissues Dorsal subcutaneous edema within the distal upper arm, extending posterior to the elbow and into the dorsal aspect of the proximal forearm. There may be a small amount of fluid within the olecranon bursa. Small amount of air in the antecubital fossa without associated focal fluid collection, likely related to a peripheral IV  or reported recent contrast extravasation. IMPRESSION: 1. Moderate-sized elbow joint effusion without evidence of erosive disease or intra-articular air. This is nonspecific and could be degenerative or inflammatory. If there is clinical concern for septic arthritis, consider arthrocentesis. 2. Dorsal subcutaneous edema within the distal upper arm, extending posterior to the elbow and into the dorsal aspect of the proximal forearm. No associated focal fluid collection. 3. Small amount of air in the antecubital fossa without associated focal fluid collection, likely related to a peripheral IV or reported recent contrast extravasation. 4. No acute osseous findings or evidence of osteomyelitis. Electronically Signed   By: Elmon Hagedorn M.D.   On: 04/30/2024 21:34   DG Elbow 2 Views Left Result  Date: 04/30/2024 CLINICAL DATA:  Pain and swelling after an IV insertion. EXAM: LEFT ELBOW - 2 VIEW COMPARISON:  04/29/2024 FINDINGS: Soft tissue swelling over the olecranon. Mild subcutaneous edema in the posterior aspect of the distal upper arm. No acute osseous abnormality. IMPRESSION: 1. Edema over the olecranon may be related to extravasated contrast material or olecranon bursitis. 2. Subcutaneous edema in the dorsal aspect of the distal upper arm, compatible with recent contrast extravasation. Electronically Signed   By: Shearon Denis M.D.   On: 04/30/2024 19:27   VAS US  CAROTID Result Date: 04/30/2024 Carotid Arterial Duplex Study Patient Name:  LOMETA HARA  Date of Exam:   04/30/2024 Medical Rec #: [id number]        Accession #:    [id number] Date of Birth: [birth year]         Patient Gender: F Patient Age:   89 years Exam Location:  [facility] Procedure:      VAS US  CAROTID Referring Phys: Authur Leghorn MELVIN --------------------------------------------------------------------------------  Indications:       CVA. Risk Factors:      Hypertension, hyperlipidemia, Diabetes. Other Factors:     Left ICA stent. Comparison Study:  04/25/2024 - Right Carotid: Velocities in the right ICA are                    consistent with an upper range 1-39% stenosis.                     Left Carotid: Left ICA stent is patent with elevated                    velocities suggestive                    of 50-74% stenosis.                     Vertebrals: Bilateral vertebral arteries demonstrate                    antegrade flow.                    Subclavians: Right subclavian artery was stenotic. Normal                    flow                    hemodynamics were seen in the left subclavian artery. Performing Technologist: Lerry Ransom RVT  Examination Guidelines: A complete evaluation includes B-mode imaging, spectral Doppler, color Doppler, and power Doppler as needed of all accessible portions of each vessel.  Bilateral testing is considered an integral part of a complete examination. Limited examinations for  reoccurring indications may be performed as noted.  Right Carotid Findings: +----------+--------+--------+--------+--------------------------+--------+           PSV cm/sEDV cm/sStenosisPlaque Description        Comments +----------+--------+--------+--------+--------------------------+--------+ CCA Prox  123     13              smooth and heterogenous            +----------+--------+--------+--------+--------------------------+--------+ CCA Distal54      9               irregular and heterogenous         +----------+--------+--------+--------+--------------------------+--------+ ICA Prox  96      19              irregular and heterogenous         +----------+--------+--------+--------+--------------------------+--------+ ICA Mid   157     44      40-59%  irregular and heterogenous         +----------+--------+--------+--------+--------------------------+--------+ ICA Distal107     26                                        tortuous +----------+--------+--------+--------+--------------------------+--------+ ECA       95      1                                                  +----------+--------+--------+--------+--------------------------+--------+ +----------+--------+-------+--------+-------------------+           PSV cm/sEDV cmsDescribeArm Pressure (mmHG) +----------+--------+-------+--------+-------------------+ [id number]     106                                +----------+--------+-------+--------+-------------------+ +---------+--------+--+--------+--+---------+ VertebralPSV cm/s43EDV cm/s10Antegrade +---------+--------+--+--------+--+---------+ Elevated velocities are noted in the subclavian artery. Left Carotid Findings: +----------+--------+--------+--------+-----------------------+--------+           PSV cm/sEDV cm/sStenosisPlaque  Description     Comments +----------+--------+--------+--------+-----------------------+--------+ CCA Prox  73      11              smooth and heterogenous         +----------+--------+--------+--------+-----------------------+--------+ CCA Distal66      12              smooth and heterogenous         +----------+--------+--------+--------+-----------------------+--------+ ICA Prox  76      17                                     Stent    +----------+--------+--------+--------+-----------------------+--------+ ICA Mid   112     31                                     Stent    +----------+--------+--------+--------+-----------------------+--------+ ICA Distal111     24                                     Stent    +----------+--------+--------+--------+-----------------------+--------+ ECA  63      10                                              +----------+--------+--------+--------+-----------------------+--------+ +----------+--------+--------+--------+-------------------+           PSV cm/sEDV cm/sDescribeArm Pressure (mmHG) +----------+--------+--------+--------+-------------------+ Subclavian131                                         +----------+--------+--------+--------+-------------------+ +---------+--------+--+--------+--+---------+ VertebralPSV cm/s66EDV cm/s17Antegrade +---------+--------+--+--------+--+---------+  Left Stent(s): +---------------+---+--++++ Prox to Stent  61 11 +---------------+---+--++++ Proximal Stent 76 17 +---------------+---+--++++ Mid Stent      11231 +---------------+---+--++++ Distal Stent   11124 +---------------+---+--++++ Distal to Stent54 20 +---------------+---+--++++    Summary: Right Carotid: Velocities in the right ICA are consistent with a 40-59%                stenosis. Left Carotid: Left ICA stent is patent with less than 50% stenosis by velocity. Vertebrals: Bilateral  vertebral arteries demonstrate antegrade flow. *See table(s) above for measurements and observations.     Preliminary    DG Elbow 2 Views Left Result Date: 04/29/2024 CLINICAL DATA:  Pain elbow pain EXAM: LEFT ELBOW - 2 VIEW COMPARISON:  CTA chest, abdomen, and pelvis dated 04/23/2024 FINDINGS: Ill-defined density dispersed through the subcutaneous soft tissues of the partially imaged upper arm overlying the ulnar aspect of the humerus. No acute dislocation. Subtle cortical angulation at the level of the radial neck. Moderate elbow joint effusion. IMPRESSION: 1. Subtle cortical angulation at the level of the radial neck, which may represent a nondisplaced fracture in the appropriate clinical setting. 2. Moderate elbow joint effusion. 3. Ill-defined density dispersed through the subcutaneous soft tissues of the partially imaged upper arm overlying the ulnar aspect of the humerus, which may represent extravasated contrast material. The patient underwent a contrast-enhanced CT examination on 04/23/2024 with contrast instillation via the left upper extremity. Electronically Signed   By: Limin  Xu M.D.   On: 04/29/2024 13:22   MR BRAIN WO CONTRAST Result Date: 04/29/2024 CLINICAL DATA:  Neuro deficit, acute, stroke suspected EXAM: MRI HEAD WITHOUT CONTRAST MRA HEAD WITHOUT CONTRAST MRA NECK WITHOUT CONTRAST TECHNIQUE: Multiplanar, multiecho pulse sequences of the brain and surrounding structures were obtained without intravenous contrast. Angiographic images of the Circle of Willis were obtained using MRA technique without intravenous contrast. Angiographic images of the neck were obtained using MRA technique without intravenous contrast. Carotid stenosis measurements (when applicable) are obtained utilizing NASCET criteria, using the distal internal carotid diameter as the denominator. COMPARISON:  CTA 04/23/2024, MRI head 12/07/2023 FINDINGS: MRI HEAD Motion artifact. Brain: There is no acute infarction or  intracranial hemorrhage. There is no intracranial mass, mass effect, or edema. There is no hydrocephalus or extra-axial fluid collection. Ventricles and sulci are stable in size and configuration. Patchy foci of T2 hyperintensity in the supratentorial white matter are nonspecific but may reflect stable mild chronic microvascular ischemic changes. Vascular: Major vessel flow voids at the skull base are preserved. Skull and upper cervical spine: Normal marrow signal is preserved. Sinuses/Orbits: Postoperative changes with minimal patchy mucosal thickening. Right lens replacement. Other: Evidence of prior sellar mass resection minimal patchy mastoid fluid opacification. Retention cysts along the posterior wall of the nasopharynx. MRA HEAD Motion artifact. Intracranial internal carotid arteries  are patent. Atherosclerotic irregularity with stenosis. There is up to marked stenosis of the right cavernous ICA and moderate marked stenosis of the left paraclinoid ICA, likely similar to prior CTA. Middle and anterior cerebral arteries are patent. 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 Intracranial vertebral arteries, basilar artery, posterior cerebral arteries are patent. Bilateral posterior communicating arteries are present. No significant stenosis. MRA NECK Motion artifact. Right common, internal, and external carotid arteries are patent. There is plaque without hemodynamically significant stenosis at the ICA origin. Subsequent circumferential narrowing of the proximal ICA causing greater than 70% stenosis. Likely no significant change from the prior CTA. Left common carotid artery is patent. There is loss of flow at the distal CCA and proximal ICA that may be related to interval stent placement. Mid to distal cervical ICA is patent. Vertebral arteries are patent. There is limited evaluation proximally due to artifact. No significant stenosis identified. IMPRESSION: Degraded by motion. No acute infarction,  hemorrhage, or mass. No significant change since the prior brain MRI. Loss of flow related enhancement of the distal CCA and proximal ICA is probably related to artifact from interval stent. Greater than 70% stenosis of the proximal right ICA is probably similar to the prior CTA. Right greater than left intracranial ICA atherosclerosis and stenosis is likely similar to the prior CTA. 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 Electronically Signed   By: Geannie Keener M.D.   On: 04/29/2024 12:38   MR ANGIO HEAD WO CONTRAST Result Date: 04/29/2024 CLINICAL DATA:  Neuro deficit, acute, stroke suspected EXAM: MRI HEAD WITHOUT CONTRAST MRA HEAD WITHOUT CONTRAST MRA NECK WITHOUT CONTRAST TECHNIQUE: Multiplanar, multiecho pulse sequences of the brain and surrounding structures were obtained without intravenous contrast. Angiographic images of the Circle of Willis were obtained using MRA technique without intravenous contrast. Angiographic images of the neck were obtained using MRA technique without intravenous contrast. Carotid stenosis measurements (when applicable) are obtained utilizing NASCET criteria, using the distal internal carotid diameter as the denominator. COMPARISON:  CTA 04/23/2024, MRI head 12/07/2023 FINDINGS: MRI HEAD Motion artifact. Brain: There is no acute infarction or intracranial hemorrhage. There is no intracranial mass, mass effect, or edema. There is no hydrocephalus or extra-axial fluid collection. Ventricles and sulci are stable in size and configuration. Patchy foci of T2 hyperintensity in the supratentorial white matter are nonspecific but may reflect stable mild chronic microvascular ischemic changes. Vascular: Major vessel flow voids at the skull base are preserved. Skull and upper cervical spine: Normal marrow signal is preserved. Sinuses/Orbits: Postoperative changes with minimal patchy mucosal thickening. Right lens replacement. Other: Evidence of prior sellar mass  resection minimal patchy mastoid fluid opacification. Retention cysts along the posterior wall of the nasopharynx. MRA HEAD Motion artifact. Intracranial internal carotid arteries are patent. Atherosclerotic irregularity with stenosis. There is up to marked stenosis of the right cavernous ICA and moderate marked stenosis of the left paraclinoid ICA, likely similar to prior CTA. Middle and anterior cerebral arteries are patent. 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 Intracranial vertebral arteries, basilar artery, posterior cerebral arteries are patent. Bilateral posterior communicating arteries are present. No significant stenosis. MRA NECK Motion artifact. Right common, internal, and external carotid arteries are patent. There is plaque without hemodynamically significant stenosis at the ICA origin. Subsequent circumferential narrowing of the proximal ICA causing greater than 70% stenosis. Likely no significant change from the prior CTA. Left common carotid artery is patent. There is loss of flow at the distal CCA  and proximal ICA that may be related to interval stent placement. Mid to distal cervical ICA is patent. Vertebral arteries are patent. There is limited evaluation proximally due to artifact. No significant stenosis identified. IMPRESSION: Degraded by motion. No acute infarction, hemorrhage, or mass. No significant change since the prior brain MRI. Loss of flow related enhancement of the distal CCA and proximal ICA is probably related to artifact from interval stent. Greater than 70% stenosis of the proximal right ICA is probably similar to the prior CTA. Right greater than left intracranial ICA atherosclerosis and stenosis is likely similar to the prior CTA. 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 Electronically Signed   By: Geannie Keener M.D.   On: 04/29/2024 12:38   MR ANGIO NECK WO CONTRAST Result Date: 04/29/2024 CLINICAL DATA:  Neuro deficit, acute, stroke  suspected EXAM: MRI HEAD WITHOUT CONTRAST MRA HEAD WITHOUT CONTRAST MRA NECK WITHOUT CONTRAST TECHNIQUE: Multiplanar, multiecho pulse sequences of the brain and surrounding structures were obtained without intravenous contrast. Angiographic images of the Circle of Willis were obtained using MRA technique without intravenous contrast. Angiographic images of the neck were obtained using MRA technique without intravenous contrast. Carotid stenosis measurements (when applicable) are obtained utilizing NASCET criteria, using the distal internal carotid diameter as the denominator. COMPARISON:  CTA 04/23/2024, MRI head 12/07/2023 FINDINGS: MRI HEAD Motion artifact. Brain: There is no acute infarction or intracranial hemorrhage. There is no intracranial mass, mass effect, or edema. There is no hydrocephalus or extra-axial fluid collection. Ventricles and sulci are stable in size and configuration. Patchy foci of T2 hyperintensity in the supratentorial white matter are nonspecific but may reflect stable mild chronic microvascular ischemic changes. Vascular: Major vessel flow voids at the skull base are preserved. Skull and upper cervical spine: Normal marrow signal is preserved. Sinuses/Orbits: Postoperative changes with minimal patchy mucosal thickening. Right lens replacement. Other: Evidence of prior sellar mass resection minimal patchy mastoid fluid opacification. Retention cysts along the posterior wall of the nasopharynx. MRA HEAD Motion artifact. Intracranial internal carotid arteries are patent. Atherosclerotic irregularity with stenosis. There is up to marked stenosis of the right cavernous ICA and moderate marked stenosis of the left paraclinoid ICA, likely similar to prior CTA. Middle and anterior cerebral arteries are patent. 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 Intracranial vertebral arteries, basilar artery, posterior cerebral arteries are patent. Bilateral posterior communicating arteries are  present. No significant stenosis. MRA NECK Motion artifact. Right common, internal, and external carotid arteries are patent. There is plaque without hemodynamically significant stenosis at the ICA origin. Subsequent circumferential narrowing of the proximal ICA causing greater than 70% stenosis. Likely no significant change from the prior CTA. Left common carotid artery is patent. There is loss of flow at the distal CCA and proximal ICA that may be related to interval stent placement. Mid to distal cervical ICA is patent. Vertebral arteries are patent. There is limited evaluation proximally due to artifact. No significant stenosis identified. IMPRESSION: Degraded by motion. No acute infarction, hemorrhage, or mass. No significant change since the prior brain MRI. Loss of flow related enhancement of the distal CCA and proximal ICA is probably related to artifact from interval stent. Greater than 70% stenosis of the proximal right ICA is probably similar to the prior CTA. Right greater than left intracranial ICA atherosclerosis and stenosis is likely similar to the prior CTA. 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 Electronically Signed   By: Esteban Heinrich.D.  On: 04/29/2024 12:38   CT HEAD CODE STROKE WO CONTRAST Result Date: 04/29/2024 CLINICAL DATA:  Code stroke.  Left-sided weakness EXAM: CT HEAD WITHOUT CONTRAST TECHNIQUE: Contiguous axial images were obtained from the base of the skull through the vertex without intravenous contrast. RADIATION DOSE REDUCTION: This exam was performed according to the departmental dose-optimization program which includes automated exposure control, adjustment of the mA and/or kV according to patient size and/or use of iterative reconstruction technique. COMPARISON:  Six days prior FINDINGS: Brain: No evidence of acute infarction, hemorrhage, hydrocephalus, extra-axial collection or mass lesion/mass effect. Extensive dural calcification Vascular: No  hyperdense vessel. Scattered areas of calcification within the superior sagittal and left transverse sigmoid dural sinuses. Skull: Normal. Negative for fracture or focal lesion. Sinuses/Orbits: Chronic sinusitis with prior endoscopic sinus surgery Other: Prelim sent in epic chat ASPECTS [facility] Stroke Program Early CT Score) - Ganglionic level infarction (caudate, lentiform nuclei, internal capsule, insula, M1-M3 cortex): 7 - Supraganglionic infarction (M4-M6 cortex): 3 Total score (0-10 with 10 being normal): 10 IMPRESSION: No acute or interval finding. Electronically Signed   By: Ronnette Coke M.D.   On: 04/29/2024 11:08   ECHOCARDIOGRAM COMPLETE Result Date: 04/25/2024    ECHOCARDIOGRAM REPORT   Patient Name:   AYRAM BELUE Date of Exam: 04/25/2024 Medical Rec #:  [id number]       Height:       66.0 in Accession #:    [id number]      Weight:       162.0 lb Date of Birth:  [birth year]        BSA:          1.829 m Patient Age:    62 years        BP:           154/61 mmHg Patient Gender: F               HR:           63 bpm. Exam Location:  Inpatient Procedure: 2D Echo, Color Doppler and Cardiac Doppler (Both Spectral and Color            Flow Doppler were utilized during procedure). Indications:    Stroke  History:        Patient has prior history of Echocardiogram examinations, most                 recent 12/08/2023.  Sonographer:    Andrena Bang Referring Phys: Leandro Proffer [place] IMPRESSIONS  1. Left ventricular ejection fraction, by estimation, is 35 to 40%. The left ventricle has moderately decreased function. The left ventricle demonstrates regional wall motion abnormalities (see scoring diagram/findings for description). Left ventricular  diastolic parameters are consistent with Grade II diastolic dysfunction (pseudonormalization).  2. Right ventricular systolic function is mildly reduced. The right ventricular size is moderately enlarged. There is moderately elevated pulmonary artery systolic pressure.  The estimated right ventricular systolic pressure is 50.0 mmHg.  3. Left atrial size was moderately dilated.  4. Right atrial size was moderately dilated.  5. The mitral valve is normal in structure. Mild to moderate mitral valve regurgitation. No evidence of mitral stenosis. Moderate mitral annular calcification.  6. Tricuspid valve regurgitation is moderate.  7. The aortic valve is calcified. There is mild calcification of the aortic valve. There is mild thickening of the aortic valve. Aortic valve regurgitation is not visualized. No aortic stenosis is present. Aortic valve mean gradient measures 6.0 mmHg. Aortic valve  Vmax measures 1.81 m/s.  8. The inferior vena cava is dilated in size with >50% respiratory variability, suggesting right atrial pressure of 8 mmHg. Conclusion(s)/Recommendation(s): No intracardiac source of embolism detected on this transthoracic study. Consider a transesophageal echocardiogram to exclude cardiac source of embolism if clinically indicated. FINDINGS  Left Ventricle: Left ventricular ejection fraction, by estimation, is 35 to 40%. The left ventricle has moderately decreased function. The left ventricle demonstrates regional wall motion abnormalities. The left ventricular internal cavity size was normal in size. There is no left ventricular hypertrophy. Left ventricular diastolic parameters are consistent with Grade II diastolic dysfunction (pseudonormalization).  LV Wall Scoring: The inferior wall and posterior wall are akinetic. Right Ventricle: The right ventricular size is moderately enlarged. No increase in right ventricular wall thickness. Right ventricular systolic function is mildly reduced. There is moderately elevated pulmonary artery systolic pressure. The tricuspid regurgitant velocity is 3.24 m/s, and with an assumed right atrial pressure of 8 mmHg, the estimated right ventricular systolic pressure is 50.0 mmHg. Left Atrium: Left atrial size was moderately dilated. Right  Atrium: Right atrial size was moderately dilated. Pericardium: There is no evidence of pericardial effusion. Mitral Valve: The mitral valve is normal in structure. Moderate mitral annular calcification. Mild to moderate mitral valve regurgitation. No evidence of mitral valve stenosis. MV peak gradient, 9.0 mmHg. The mean mitral valve gradient is 3.0 mmHg. Tricuspid Valve: The tricuspid valve is normal in structure. Tricuspid valve regurgitation is moderate . No evidence of tricuspid stenosis. Aortic Valve: The aortic valve is calcified. There is mild calcification of the aortic valve. There is mild thickening of the aortic valve. Aortic valve regurgitation is not visualized. No aortic stenosis is present. Aortic valve mean gradient measures 6.0 mmHg. Aortic valve peak gradient measures 13.1 mmHg. Aortic valve area, by VTI measures 1.21 cm. Pulmonic Valve: The pulmonic valve was normal in structure. Pulmonic valve regurgitation is not visualized. No evidence of pulmonic stenosis. Aorta: The aortic root is normal in size and structure. Venous: The inferior vena cava is dilated in size with greater than 50% respiratory variability, suggesting right atrial pressure of 8 mmHg. IAS/Shunts: No atrial level shunt detected by color flow Doppler.  LEFT VENTRICLE PLAX 2D LVIDd:         6.00 cm      Diastology LVIDs:         5.10 cm      LV e' medial:    4.24 cm/s LV PW:         0.85 cm      LV E/e' medial:  31.6 LV IVS:        1.15 cm      LV e' lateral:   7.18 cm/s LVOT diam:     1.70 cm      LV E/e' lateral: 18.7 LV SV:         48 LV SV Index:   26 LVOT Area:     2.27 cm  LV Volumes (MOD) LV vol d, MOD A2C: 152.0 ml LV vol d, MOD A4C: 143.0 ml LV vol s, MOD A2C: 82.1 ml LV vol s, MOD A4C: 92.2 ml LV SV MOD A2C:     69.9 ml LV SV MOD A4C:     143.0 ml LV SV MOD BP:      62.8 ml RIGHT VENTRICLE RV S prime:     6.31 cm/s TAPSE (M-mode): 1.1 cm LEFT ATRIUM           Index  LA diam:      4.00 cm 2.19 cm/m LA Vol (A2C): 78.2 ml  42.76 ml/m LA Vol (A4C): 44.5 ml 24.33 ml/m  AORTIC VALVE AV Area (Vmax):    1.14 cm AV Area (Vmean):   1.13 cm AV Area (VTI):     1.21 cm AV Vmax:           181.00 cm/s AV Vmean:          111.000 cm/s AV VTI:            0.401 m AV Peak Grad:      13.1 mmHg AV Mean Grad:      6.0 mmHg LVOT Vmax:         90.80 cm/s LVOT Vmean:        55.200 cm/s LVOT VTI:          0.213 m LVOT/AV VTI ratio: 0.53  AORTA Ao Asc diam: 3.10 cm MITRAL VALVE                TRICUSPID VALVE MV Area (PHT): 5.27 cm     TV Peak grad:   42.0 mmHg MV Area VTI:   1.18 cm     TV Vmax:        3.24 m/s MV Peak grad:  9.0 mmHg     TR Peak grad:   42.0 mmHg MV Mean grad:  3.0 mmHg     TR Vmax:        324.00 cm/s MV Vmax:       1.50 m/s MV Vmean:      75.5 cm/s    SHUNTS MV Decel Time: 144 msec     Systemic VTI:  0.21 m MV E velocity: 134.00 cm/s  Systemic Diam: 1.70 cm MV A velocity: 89.40 cm/s MV E/A ratio:  1.50 Dorothye Gathers MD Electronically signed by Dorothye Gathers MD Signature Date/Time: 04/25/2024/3:19:00 PM    Final    VAS US  CAROTID Result Date: 04/25/2024 Carotid Arterial Duplex Study Patient Name:  BRITNI PLETT  Date of Exam:   04/25/2024 Medical Rec #: [id number]        Accession #:    [id number] Date of Birth: 02-14-1961         Patient Gender: F Patient Age:   50 years Exam Location:  [facility] Procedure:      VAS US  CAROTID Referring Phys: Margart Shears DE LA TORRE --------------------------------------------------------------------------------  Indications:       CVA. Risk Factors:      Hypertension, hyperlipidemia, prior MI, coronary artery                    disease, prior CVA, PAD. Comparison Study:  04/23/2024 CTA neck- 1. Negative for large vessel occlusion,                    But Positive for VERY SEVERE underlying atherosclerosis with:                    - LICA origin and bilateral ICA siphon RADIOGRAPHIC STRING                    SIGN STENOSES.                    - Right ICA distal bulb high-grade stenosis, estimated 70-80%                     and  approaching a radiographic string sign. Performing Technologist: Delford Felling MHA, RDMS, RVT, RDCS  Examination Guidelines: A complete evaluation includes B-mode imaging, spectral Doppler, color Doppler, and power Doppler as needed of all accessible portions of each vessel. Bilateral testing is considered an integral part of a complete examination. Limited examinations for reoccurring indications may be performed as noted.  Right Carotid Findings: +----------+--------+--------+--------+-----------------------+--------+           PSV cm/sEDV cm/sStenosisPlaque Description     Comments +----------+--------+--------+--------+-----------------------+--------+ CCA Prox  123     14                                              +----------+--------+--------+--------+-----------------------+--------+ CCA Distal79      8               heterogenous and smooth         +----------+--------+--------+--------+-----------------------+--------+ ICA Prox  170     36      1-39%   smooth and heterogenous         +----------+--------+--------+--------+-----------------------+--------+ ICA Mid   126     24                                              +----------+--------+--------+--------+-----------------------+--------+ ICA Distal70      12                                              +----------+--------+--------+--------+-----------------------+--------+ ECA       116                                                     +----------+--------+--------+--------+-----------------------+--------+ +----------+--------+-------+--------+--------------------+           PSV cm/sEDV cmsDescribeArm Pressure (mmHG)  +----------+--------+-------+--------+--------------------+ [id number]            StenoticRestricted extremity +----------+--------+-------+--------+--------------------+ +---------+--------+--+--------+-+---------+  VertebralPSV cm/s34EDV cm/s4Antegrade +---------+--------+--+--------+-+---------+  Left Carotid Findings: +----------+--------+--------+--------+------------------+--------+           PSV cm/sEDV cm/sStenosisPlaque DescriptionComments +----------+--------+--------+--------+------------------+--------+ CCA Prox  77      9                                          +----------+--------+--------+--------+------------------+--------+ CCA Distal                                          stent    +----------+--------+--------+--------+------------------+--------+ ICA Prox                                            stent    +----------+--------+--------+--------+------------------+--------+ ICA Mid  stent    +----------+--------+--------+--------+------------------+--------+ ICA Distal104     24                                         +----------+--------+--------+--------+------------------+--------+ ECA       287     13                                         +----------+--------+--------+--------+------------------+--------+ +----------+--------+--------+----------------+-------------------+           PSV cm/sEDV cm/sDescribe        Arm Pressure (mmHG) +----------+--------+--------+----------------+-------------------+ [id number]              Multiphasic, [id number]                 +----------+--------+--------+----------------+-------------------+ +---------+--------+---+--------+--+---------+ VertebralPSV cm/s101EDV cm/s23Antegrade +---------+--------+---+--------+--+---------+  Left Stent(s): +---------------+--------+--------+--------+--------+--------+ ICA            PSV cm/sEDV cm/sStenosisWaveformComments +---------------+--------+--------+--------+--------+--------+ Prox to Stent  96      17                               +---------------+--------+--------+--------+--------+--------+  Proximal Stent 82      12                               +---------------+--------+--------+--------+--------+--------+ Mid Stent      227     27                               +---------------+--------+--------+--------+--------+--------+ Distal Stent   101     24                               +---------------+--------+--------+--------+--------+--------+ Distal to Stent104     24                               +---------------+--------+--------+--------+--------+--------+    Summary: Right Carotid: Velocities in the right ICA are consistent with an upper range                1-39% stenosis. Left Carotid: Left ICA stent is patent with elevated velocities suggestive of               50-74% stenosis. Vertebrals:  Bilateral vertebral arteries demonstrate antegrade flow. Subclavians: Right subclavian artery was stenotic. Normal flow hemodynamics were              seen in the left subclavian artery. *See table(s) above for measurements and observations.     Preliminary    EEG adult Result Date: 04/25/2024 Arleene Lack, MD     04/25/2024 10:18 AM Patient Name: LEXANI HARER MRN: [id number] Epilepsy Attending: Arleene Lack Referring Physician/Provider: Colon Dear, NP Date: 04/25/2024 Duration: 25.36 mins Patient history: 63 year old female with acute ischemic stroke getting EEG to evaluate for seizure. Level of alertness: Awake AEDs during EEG study: GBP, TPM Technical aspects: This EEG study was done with scalp electrodes positioned according to the 10-20 International system of electrode placement. Electrical activity was reviewed  with band pass filter of 1-70Hz , sensitivity of 7 uV/mm, display speed of 97mm/sec with a 60Hz  notched filter applied as appropriate. EEG data were recorded continuously and digitally stored.  Video monitoring was available and reviewed as appropriate. Description: The posterior dominant rhythm consists of 8 Hz activity of moderate voltage (25-35  uV) seen predominantly in posterior head regions, symmetric and reactive to eye opening and eye closing. Hyperventilation and photic stimulation were not performed.   IMPRESSION: This study is within normal limits. No seizures or epileptiform discharges were seen throughout the recording. A normal interictal EEG does not exclude the diagnosis of epilepsy. Arleene Lack   MR BRAIN WO CONTRAST Result Date: 04/24/2024 CLINICAL DATA:  Stroke follow-up EXAM: MRI HEAD WITHOUT CONTRAST TECHNIQUE: Multiplanar, multiecho pulse sequences of the brain and surrounding structures were obtained without intravenous contrast. COMPARISON:  12/07/2023 FINDINGS: Brain: No acute infarction, hemorrhage, hydrocephalus, extra-axial collection or mass lesion. Chronic generalized dural thickening with calcification by CT. Vascular: Diffusely attenuated superior sagittal sinus with absent flow void continuing into the left sigmoid sinus, chronic partial thrombosis by prior enhanced imaging with prominent venous collaterals along the right inferior convexity. Chronic thrombus associated with calcifications. Skull and upper cervical spine: Diffusely hypointense marrow signal. No focal lesion seen. Sinuses/Orbits: Prior endoscopic sinus surgery. Right proptosis not associated with acute inflammation, stable from 2024. Nasopharyngeal retention cysts. IMPRESSION: No acute finding.  Negative for acute infarct. Chronic dural venous sinus thrombosis with collaterals. Electronically Signed   By: Ronnette Coke M.D.   On: 04/24/2024 06:03   IR PERCUTANEOUS ART THROMBECTOMY/INFUSION INTRACRANIAL INC DIAG ANGIO Result Date: 04/23/2024 PROCEDURE PERFORMED: 1. Stroke thrombectomy 2. Ultrasound vascular access 3. Cone beam CT for treatment planning COMPARISON:  CT perfusion performed Apr 23, 2024. CLINICAL DATA:  63 year old female with acute ischemic stroke with symptoms of right motor dysfunction sensory deficits and dysarthria with an NIH stroke  scale measuring 14. CT imaging demonstrated a severe proximal left internal carotid artery stenosis and perfusion imaging suggested hypoperfusion to the left hemisphere. After discussing the case in a multidisciplinary fashion, and confirming that there was no immediate available decision maker, the patient was transferred for thrombectomy and emergent 2 physician consent was documented. INDICATION: Acute ischemic ischemic stroke with left hemi severe hypoperfusion. ANESTHESIA/SEDATION: General anesthesia was utilized for the procedure. CONTRAST:  Approximately 75 cc Omnipaque  300 320 MEDICATIONS: Heparin  6000 units intravenously Brilinta  180 mg per tube FLUOROSCOPY TIME:  Fluoroscopy Time: 15.8 minutes (737 mGy). COMPLICATIONS: None immediate. BODY OF REPORT: The patient was then put under general anesthesia by the Department of Anesthesiology at [facility]. Insert ultrasound access The right groin was prepped and draped in the usual sterile fashion. Ultrasound was used to study the right common femoral artery which was patent. Using real-time ultrasound guidance, a 19 gauge introducer needle was used to access the right common femoral artery. Access was performed at 1357. A hard copy image ultrasound the same date uncertain PACS. Using this access, a 6 French sheath was placed in the descending thoracic aorta. Approximately 5000 units of heparin  was administered intravenously. Next, selective catheterization the left common carotid artery was performed. A selective arteriogram was performed which demonstrated that the internal carotid artery was patent. But demonstrated a severe focal stenosis estimated at greater than 80% in the single frontal plane of imaging. This was use a lies as it best profiled the bifurcation between the internal carotid artery and external carotid artery. There was no evidence of intracranial  filling defect. Scattered mild diffuse atherosclerotic changes are present. Next, the  lesion was crossed with a catheter and guidewire. At this point, my tempted to deployed embolic protection device, however, I could not safely deployed the device in the proximal internal carotid artery secondary to tortuosity and concern for device detachment. Therefore, the proximal stenosis was treated with balloon angioplasty using a 4 mm noncompliant balloon. This was considered the first pass for the purposes of documentation and this was performed at 14:20. Next, a precise 6 mm x 40 mm self expanding stent was deployed in the proximal left internal carotid artery extending into the common carotid artery. Deployment was completed at 14:50 and this was considered recanalization. There was a residual waist and therefore stent dilation was performed using a 5 mm noncompliant balloon. A post treatment arteriogram demonstrated no evidence of significant residual proximal stenosis. There was good apposition of the bare metal stent. There was persistent filling of the left external carotid artery. The intracranial arteries were improved in terms of caliber. There was no evidence of filling defect to suggest peripheral embolus. I elected to terminate the procedure at this point. The revascularization score was graded at TICI-3 Evaluation of the right femoral access site demonstrated at this site was suitable for closure device. A 6 French Angio-Seal device was deployed without complication. Become beam CT was then performed to evaluate for intracranial hemorrhage and treatment planning. This demonstrated no evidence significant acute intracranial hemorrhage. Fluoroscopic imaging confirmed placement of an enteric tube within the stomach. At this point, 180 mg of Brilinta  was administered per tube. The patient was then transferred to the ICU in stable condition while remaining intubated. IMPRESSION: 1. Emergent left internal carotid artery stent placement due to clinical concern for is global left-sided hypoperfusion  and developing stroke in the setting of severe left internal carotid artery stenosis. PLAN: 1. To ICU for routine postoperative supportive care. 2. The patient will continue on a heparin  infusion intravenously at 500 units/hour for the next 6 hours. At that point, heparin  will be discontinued. 3. The patient has received a bolus dose of aspirin  PR and a bolus dose of 180 mg of Brilinta  per tube. Electronically Signed   By: Reagan Camera M.D.   On: 04/23/2024 16:31   IR US  Guide Vasc Access Right Result Date: 04/23/2024 PROCEDURE PERFORMED: 1. Stroke thrombectomy 2. Ultrasound vascular access 3. Cone beam CT for treatment planning COMPARISON:  CT perfusion performed Apr 23, 2024. CLINICAL DATA:  63 year old female with acute ischemic stroke with symptoms of right motor dysfunction sensory deficits and dysarthria with an NIH stroke scale measuring 14. CT imaging demonstrated a severe proximal left internal carotid artery stenosis and perfusion imaging suggested hypoperfusion to the left hemisphere. After discussing the case in a multidisciplinary fashion, and confirming that there was no immediate available decision maker, the patient was transferred for thrombectomy and emergent 2 physician consent was documented. INDICATION: Acute ischemic ischemic stroke with left hemi severe hypoperfusion. ANESTHESIA/SEDATION: General anesthesia was utilized for the procedure. CONTRAST:  Approximately 75 cc Omnipaque  300 320 MEDICATIONS: Heparin  6000 units intravenously Brilinta  180 mg per tube FLUOROSCOPY TIME:  Fluoroscopy Time: 15.8 minutes (737 mGy). COMPLICATIONS: None immediate. BODY OF REPORT: The patient was then put under general anesthesia by the Department of Anesthesiology at [facility]. Insert ultrasound access The right groin was prepped and draped in the usual sterile fashion. Ultrasound was used to study the right common femoral artery which was patent. Using real-time ultrasound  guidance, a 19 gauge  introducer needle was used to access the right common femoral artery. Access was performed at 1357. A hard copy image ultrasound the same date uncertain PACS. Using this access, a 6 French sheath was placed in the descending thoracic aorta. Approximately 5000 units of heparin  was administered intravenously. Next, selective catheterization the left common carotid artery was performed. A selective arteriogram was performed which demonstrated that the internal carotid artery was patent. But demonstrated a severe focal stenosis estimated at greater than 80% in the single frontal plane of imaging. This was use a lies as it best profiled the bifurcation between the internal carotid artery and external carotid artery. There was no evidence of intracranial filling defect. Scattered mild diffuse atherosclerotic changes are present. Next, the lesion was crossed with a catheter and guidewire. At this point, my tempted to deployed embolic protection device, however, I could not safely deployed the device in the proximal internal carotid artery secondary to tortuosity and concern for device detachment. Therefore, the proximal stenosis was treated with balloon angioplasty using a 4 mm noncompliant balloon. This was considered the first pass for the purposes of documentation and this was performed at 14:20. Next, a precise 6 mm x 40 mm self expanding stent was deployed in the proximal left internal carotid artery extending into the common carotid artery. Deployment was completed at 14:50 and this was considered recanalization. There was a residual waist and therefore stent dilation was performed using a 5 mm noncompliant balloon. A post treatment arteriogram demonstrated no evidence of significant residual proximal stenosis. There was good apposition of the bare metal stent. There was persistent filling of the left external carotid artery. The intracranial arteries were improved in terms of caliber. There was no evidence of  filling defect to suggest peripheral embolus. I elected to terminate the procedure at this point. The revascularization score was graded at TICI-3 Evaluation of the right femoral access site demonstrated at this site was suitable for closure device. A 6 French Angio-Seal device was deployed without complication. Become beam CT was then performed to evaluate for intracranial hemorrhage and treatment planning. This demonstrated no evidence significant acute intracranial hemorrhage. Fluoroscopic imaging confirmed placement of an enteric tube within the stomach. At this point, 180 mg of Brilinta  was administered per tube. The patient was then transferred to the ICU in stable condition while remaining intubated. IMPRESSION: 1. Emergent left internal carotid artery stent placement due to clinical concern for is global left-sided hypoperfusion and developing stroke in the setting of severe left internal carotid artery stenosis. PLAN: 1. To ICU for routine postoperative supportive care. 2. The patient will continue on a heparin  infusion intravenously at 500 units/hour for the next 6 hours. At that point, heparin  will be discontinued. 3. The patient has received a bolus dose of aspirin  PR and a bolus dose of 180 mg of Brilinta  per tube. Electronically Signed   By: Reagan Camera M.D.   On: 04/23/2024 16:31   IR CT Head Ltd Result Date: 04/23/2024 PROCEDURE PERFORMED: 1. Stroke thrombectomy 2. Ultrasound vascular access 3. Cone beam CT for treatment planning COMPARISON:  CT perfusion performed Apr 23, 2024. CLINICAL DATA:  63 year old female with acute ischemic stroke with symptoms of right motor dysfunction sensory deficits and dysarthria with an NIH stroke scale measuring 14. CT imaging demonstrated a severe proximal left internal carotid artery stenosis and perfusion imaging suggested hypoperfusion to the left hemisphere. After discussing the case in a multidisciplinary fashion, and confirming that there  was no immediate  available decision maker, the patient was transferred for thrombectomy and emergent 2 physician consent was documented. INDICATION: Acute ischemic ischemic stroke with left hemi severe hypoperfusion. ANESTHESIA/SEDATION: General anesthesia was utilized for the procedure. CONTRAST:  Approximately 75 cc Omnipaque  300 320 MEDICATIONS: Heparin  6000 units intravenously Brilinta  180 mg per tube FLUOROSCOPY TIME:  Fluoroscopy Time: 15.8 minutes (737 mGy). COMPLICATIONS: None immediate. BODY OF REPORT: The patient was then put under general anesthesia by the Department of Anesthesiology at [facility]. Insert ultrasound access The right groin was prepped and draped in the usual sterile fashion. Ultrasound was used to study the right common femoral artery which was patent. Using real-time ultrasound guidance, a 19 gauge introducer needle was used to access the right common femoral artery. Access was performed at 1357. A hard copy image ultrasound the same date uncertain PACS. Using this access, a 6 French sheath was placed in the descending thoracic aorta. Approximately 5000 units of heparin  was administered intravenously. Next, selective catheterization the left common carotid artery was performed. A selective arteriogram was performed which demonstrated that the internal carotid artery was patent. But demonstrated a severe focal stenosis estimated at greater than 80% in the single frontal plane of imaging. This was use a lies as it best profiled the bifurcation between the internal carotid artery and external carotid artery. There was no evidence of intracranial filling defect. Scattered mild diffuse atherosclerotic changes are present. Next, the lesion was crossed with a catheter and guidewire. At this point, my tempted to deployed embolic protection device, however, I could not safely deployed the device in the proximal internal carotid artery secondary to tortuosity and concern for device detachment. Therefore,  the proximal stenosis was treated with balloon angioplasty using a 4 mm noncompliant balloon. This was considered the first pass for the purposes of documentation and this was performed at 14:20. Next, a precise 6 mm x 40 mm self expanding stent was deployed in the proximal left internal carotid artery extending into the common carotid artery. Deployment was completed at 14:50 and this was considered recanalization. There was a residual waist and therefore stent dilation was performed using a 5 mm noncompliant balloon. A post treatment arteriogram demonstrated no evidence of significant residual proximal stenosis. There was good apposition of the bare metal stent. There was persistent filling of the left external carotid artery. The intracranial arteries were improved in terms of caliber. There was no evidence of filling defect to suggest peripheral embolus. I elected to terminate the procedure at this point. The revascularization score was graded at TICI-3 Evaluation of the right femoral access site demonstrated at this site was suitable for closure device. A 6 French Angio-Seal device was deployed without complication. Become beam CT was then performed to evaluate for intracranial hemorrhage and treatment planning. This demonstrated no evidence significant acute intracranial hemorrhage. Fluoroscopic imaging confirmed placement of an enteric tube within the stomach. At this point, 180 mg of Brilinta  was administered per tube. The patient was then transferred to the ICU in stable condition while remaining intubated. IMPRESSION: 1. Emergent left internal carotid artery stent placement due to clinical concern for is global left-sided hypoperfusion and developing stroke in the setting of severe left internal carotid artery stenosis. PLAN: 1. To ICU for routine postoperative supportive care. 2. The patient will continue on a heparin  infusion intravenously at 500 units/hour for the next 6 hours. At that point, heparin   will be discontinued. 3. The patient has received a bolus dose  of aspirin  PR and a bolus dose of 180 mg of Brilinta  per tube. Electronically Signed   By: Reagan Camera M.D.   On: 04/23/2024 16:31   CT CEREBRAL PERFUSION W CONTRAST Result Date: 04/23/2024 CLINICAL DATA:  63 year old female code stroke presentation, profound unilateral lower extremity weakness. EXAM: CT PERFUSION BRAIN TECHNIQUE: Multiphase CT imaging of the brain was performed following IV bolus contrast injection. Subsequent parametric perfusion maps were calculated using RAPID software. RADIATION DOSE REDUCTION: This exam was performed according to the departmental dose-optimization program which includes automated exposure control, adjustment of the mA and/or kV according to patient size and/or use of iterative reconstruction technique. CONTRAST:  50mL OMNIPAQUE  IOHEXOL  350 MG/ML SOLN COMPARISON:  CTA 1207 hours today. FINDINGS: FINDINGS ASPECTS: 10 CBF (<30%) Volume: 0mL. No CBF ir CBV parameter abnormality detected. Perfusion (Tmax>6.0s) volume: 0mL however, 88 mL of T-max greater than 4s is detected which is mostly (although not entirely) in the left hemisphere. Mismatch Volume: Not applicablemL Infarction Location:Not applicable IMPRESSION: 1. NO infarct core or penumbra detected by standard CTP parameters. 2. Suggestion of asymmetric oligemia in the left hemisphere (T-max > 4s), which might relate to the severe ICA stenoses described on CTA today. Electronically Signed   By: Marlise Simpers M.D.   On: 04/23/2024 13:04   CT Angio Chest/Abd/Pel for Dissection W and/or W/WO Result Date: 04/23/2024 CLINICAL DATA:  Weakness and back pain. Acute aortic syndrome suspected. EXAM: CT ANGIOGRAPHY CHEST, ABDOMEN AND PELVIS TECHNIQUE: Non-contrast CT of the chest was initially obtained. Multidetector CT imaging through the chest, abdomen and pelvis was performed using the standard protocol during bolus administration of intravenous contrast. Multiplanar  reconstructed images and MIPs were obtained and reviewed to evaluate the vascular anatomy. RADIATION DOSE REDUCTION: This exam was performed according to the departmental dose-optimization program which includes automated exposure control, adjustment of the mA and/or kV according to patient size and/or use of iterative reconstruction technique. CONTRAST:  OMNIPAQUE  IOHEXOL  350 MG/ML SOLN COMPARISON:  CT abdomen pelvis dated 02/09/2022. FINDINGS: Evaluation is limited due to streak artifact caused by patient's arms and overlying support wires. CTA CHEST FINDINGS Cardiovascular: Mild cardiomegaly. No pericardial effusion. There is mild atherosclerotic calcification of the thoracic aorta. No aneurysmal dilatation or dissection. The origins of the great vessels of the aortic arch appear patent. No pulmonary artery embolus identified. Mediastinum/Nodes: No hilar or mediastinal adenopathy. There is circumferential thickening of the mid esophagus concerning for esophagitis. Clinical correlation is recommended. An infiltrative process is not excluded. No mediastinal fluid collection. Lungs/Pleura: There are minimal bibasilar linear atelectasis. No focal consolidation, pleural effusion, or pneumothorax. The central airways are patent. Musculoskeletal: Median sternotomy wires. No acute osseous pathology. Review of the MIP images confirms the above findings. CTA ABDOMEN AND PELVIS FINDINGS VASCULAR Aorta: Advanced atherosclerotic calcification of the abdominal aorta. No aneurysmal dilatation or dissection. No periaortic fluid collection. Celiac: Atherosclerotic calcification of the origin of the celiac artery. The celiac artery and its major branches are patent. SMA: The SMA is patent. Renals: There is atherosclerotic calcification of the origins of the renal arteries with luminal narrowing. There is diminished flow in the renal arteries bilaterally. The renal arteries however remain patent. IMA: Atherosclerotic  calcification of the IMA with diminished flow. Focal area of high-grade narrowing of the proximal IMA with reconstitution of flow distal to this segment. Inflow: Advanced atherosclerotic calcification of the iliac arteries. The iliac arteries are patent. Veins: The IVC is unremarkable.  No portal venous gas. Review of the  MIP images confirms the above findings. NON-VASCULAR No intra-abdominal free air.  Small perihepatic ascites. Hepatobiliary: The liver is unremarkable. No biliary dilatation. The gallbladder is unremarkable. Pancreas: Unremarkable. No pancreatic ductal dilatation or surrounding inflammatory changes. Spleen: Normal in size without focal abnormality. Adrenals/Urinary Tract: The adrenal glands are unremarkable. Moderate bilateral renal parenchyma atrophy. No hydronephrosis. There is stranding of the perinephric and renal sinus fat. Correlation with urinalysis recommended to exclude UTI. The urinary bladder is collapsed. Stomach/Bowel: Mild sigmoid diverticulosis. There is no bowel obstruction or active inflammation. The appendix is normal. Lymphatic: No adenopathy. Reproductive: The uterus is grossly unremarkable. No suspicious adnexal masses. Other: None Musculoskeletal: No acute or significant osseous findings. Review of the MIP images confirms the above findings. IMPRESSION: 1. No aortic aneurysm or dissection. 2. Circumferential thickening of the mid esophagus concerning for esophagitis. Clinical correlation is recommended. 3. Mild sigmoid diverticulosis. No bowel obstruction. Normal appendix. 4. Moderate bilateral renal parenchyma atrophy. No hydronephrosis. Correlation with urinalysis recommended to exclude UTI. Electronically Signed   By: Angus Bark M.D.   On: 04/23/2024 12:44   CT ANGIO HEAD NECK W WO CM (CODE STROKE) Result Date: 04/23/2024 CLINICAL DATA:  63 year old female code stroke presentation. Possible left MCA syndrome. Dialysis patient. EXAM: CT ANGIOGRAPHY HEAD AND NECK WITH  AND WITHOUT CONTRAST TECHNIQUE: Multidetector CT imaging of the head and neck was performed using the standard protocol during bolus administration of intravenous contrast. Multiplanar CT image reconstructions and MIPs were obtained to evaluate the vascular anatomy. Carotid stenosis measurements (when applicable) are obtained utilizing NASCET criteria, using the distal internal carotid diameter as the denominator. RADIATION DOSE REDUCTION: This exam was performed according to the departmental dose-optimization program which includes automated exposure control, adjustment of the mA and/or kV according to patient size and/or use of iterative reconstruction technique. CONTRAST:  50mL OMNIPAQUE  IOHEXOL  350 MG/ML SOLN COMPARISON:  Head CT 1158 hours today. FINDINGS: CTA NECK Skeleton: Reversal of the normal cervical lordosis. Previous sternotomy. Mostly absent dentition. Upper thoracic spina bifida occulta. No acute osseous abnormality identified. Upper chest: Previous CABG. Other neck: Partially retropharyngeal carotids. Nonvascular neck soft tissue spaces are within normal limits. Aortic arch: Calcified aortic atherosclerosis. Bovine arch configuration. Right carotid system: Brachiocephalic artery and proximal right CCA are patent with mild plaque and no stenosis. Tortuous, partially retropharyngeal right CCA. Calcified plaque at the right ICA origin without significant stenosis. Soft and calcified plaque at the bulb or just distal to the bulb with high-grade stenosis (series 2, image 94 and series 6, image 68) approaching a radiographic string sign, numerically estimated at 70-80 % with respect to the distal vessel. The vessel remains patent to the skull base. Left carotid system: Left CCA origin plaque with less than 50% stenosis. Tortuous left CCA. Soft and calcified plaque before the bifurcation. Severe plaque at the left carotid bifurcation with radiographic string sign stenosis series 5, image 133. But the vessel  remains patent. Vertebral arteries: Right subclavian origin calcified plaque without stenosis. Diminutive and atherosclerotic right vertebral artery origin and V1 segment with at least moderate stenosis. Intermittent right V2 segment plaque, but the right vertebral artery caliber improves to the skull base with no additional significant stenosis. Left subclavian origin plaque without stenosis. Calcified plaque at the left vertebral artery origin with similar mild to moderate stenosis. Codominant left vertebral artery with intermittent V2 and V3 segment plaque but no other significant stenosis to the skull base. CTA HEAD Posterior circulation: Atherosclerotic distal vertebral arteries and vertebrobasilar junction. Dominant  left V4. PICA origins remain patent. Mild distal vertebral and vertebrobasilar stenosis. Patent basilar artery without stenosis. Patent SCA and PCA origins. Small posterior communicating arteries. Bilateral PCA branches are within normal limits. Anterior circulation:  Both ICA siphons are patent. Severe calcified atherosclerosis and stenosis of the cavernous left ICA on series 6, image 102, string sign stenosis. Moderate additional left supraclinoid plaque. The vessel remains patent. Left posterior communicating artery origin is normal. Similar severe calcified right siphon plaque and stenosis. Distal right ICA remains patent with moderate additional supraclinoid plaque and stenosis. Patent carotid termini, MCA and ACA origins. Normal anterior communicating artery. Bilateral ACA branches are within normal limits. Right MCA M1 segment and bifurcation are patent without stenosis. No right MCA branch occlusion is identified. Left MCA M1 segment is patent without stenosis. There is a saccular 2-3 mm left MCA bifurcation aneurysm on series 6, image 112, directed anteriorly and laterally. Left MCA bifurcation remains patent. No left MCA branch occlusion is identified. Venous sinuses: Not evaluated due to  early contrast timing. Anatomic variants: Dominant left vertebral artery. Review of the MIP images confirms the above findings IMPRESSION: 1. Negative for large vessel occlusion, But Positive for VERY SEVERE underlying atherosclerosis with: - LICA origin and bilateral ICA siphon RADIOGRAPHIC STRING SIGN STENOSES. - Right ICA distal bulb high-grade stenosis, estimated 70-80% and approaching a radiographic string sign. 2. 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 3. Less pronounced posterior circulation, bilateral vertebral artery atherosclerosis but still evidence of up to moderate bilateral vertebral artery stenoses. 4.  Aortic Atherosclerosis (ICD10-I70.0). Salient findings discussed by telephone with Dr. Doretta Gant on 04/23/2024 at 12:25 . Electronically Signed   By: Marlise Simpers M.D.   On: 04/23/2024 12:27   CT HEAD CODE STROKE WO CONTRAST Result Date: 04/23/2024 CLINICAL DATA:  Code stroke. 63 year old female. Dialysis patient. Pain. EXAM: CT HEAD WITHOUT CONTRAST TECHNIQUE: Contiguous axial images were obtained from the base of the skull through the vertex without intravenous contrast. RADIATION DOSE REDUCTION: This exam was performed according to the departmental dose-optimization program which includes automated exposure control, adjustment of the mA and/or kV according to patient size and/or use of iterative reconstruction technique. COMPARISON:  Head CT,Brain MRI 09-Dec-2023. FINDINGS: Brain: Pronounced chronic dural calcification, stable. Stable cerebral volume. No midline shift, ventriculomegaly, mass effect, evidence of mass lesion, intracranial hemorrhage or evidence of cortically based acute infarction. Stable gray-white matter differentiation throughout the brain, relatively normal for age. Vascular: Chronic severe calcification. Skull: Stable visualized osseous structures. Sinuses/Orbits: Severe chronic paranasal sinusitis with mucoperiosteal thickening, chronically eroded posterior  septum and sphenoid septum. Stable appearance since Dec 08, 2024. Tympanic cavities and mastoids remain well aerated. Other: No acute orbit or scalp soft tissue finding. ASPECTS [facility] Stroke Program Early CT Score) Total score (0-10 with 10 being normal): 10 IMPRESSION: 1. Stable noncontrast CT appearance of the brain since 12/08/24, remarkable for unusually advanced dural calcification. ASPECTS 10. No acute intracranial hemorrhage. 2. Severe chronic paranasal sinus disease. 3. These results were communicated to Dr. Doretta Gant at 12:11 pm on 04/23/2024 by text page via the [facility] messaging system. Electronically Signed   By: Marlise Simpers M.D.   On: 04/23/2024 12:12   PERIPHERAL VASCULAR CATHETERIZATION Result Date: 04/17/2024 See surgical note for result.   Labs: BNP (last 3 results) Recent Labs    December 09, 2023 0826  BNP 1,576.0*   Basic Metabolic Panel: Recent Labs  Lab 04/26/24 0959 04/27/24 1758 04/29/24 1255 04/29/24 1302 04/30/24 0700 05/01/24 0612 05/02/24 0459  05/03/24 0629  NA 132* 135 135 134* 133* 133* 134* 132*  K 4.4 4.2 4.6 4.6 4.9 4.4 4.4 4.3  CL 95* 95* 96* 99 94* 93* 95* 92*  CO2 22 25 24   --  23 23 23 23   GLUCOSE 176* 139* 160* 159* 205* 174* 79 136*  BUN 62* 36* 56* 51* 67* 37* 44* 29*  CREATININE 9.03* 5.83* 8.05* 8.10* 9.54* 6.61* 8.08* 6.20*  CALCIUM  8.7* 8.9 8.9  --  8.6* 9.1 8.3* 9.1  PHOS 5.8* 3.5  --   --   --   --   --   --    Liver Function Tests: Recent Labs  Lab 04/26/24 0959 04/27/24 1758 04/29/24 1255 04/30/24 0700  AST  --   --  12* 11*  ALT  --   --  10 10  ALKPHOS  --   --  79 79  BILITOT  --   --  0.9 0.4  PROT  --   --  7.3 7.1  ALBUMIN  3.4* 3.4* 3.4* 3.3*   No results for input(s): "LIPASE", "AMYLASE" in the last 168 hours. No results for input(s): "AMMONIA" in the last 168 hours. CBC: Recent Labs  Lab 04/29/24 1255 04/29/24 1302 04/30/24 0700 05/01/24 0612 05/02/24 0459 05/03/24 0629  WBC 10.0  --  9.4 11.6* 10.4 9.0  NEUTROABS 7.3  --    --   --  7.8*  --   HGB 10.2* 10.5* 10.1* 10.9* 9.8* 10.3*  HCT 33.2* 31.0* 31.3* 34.4* 30.3* 31.4*  MCV 95.1  --  91.8 92.0 91.3 89.5  PLT 267  --  278 317 288 316   Cardiac Enzymes: No results for input(s): "CKTOTAL", "CKMB", "CKMBINDEX", "TROPONINI" in the last 168 hours. BNP: Invalid input(s): "POCBNP" CBG: Recent Labs  Lab 05/02/24 0734 05/02/24 1121 05/02/24 1925 05/02/24 2111 05/03/24 0627  GLUCAP 89 142* 73 166* 140*   D-Dimer No results for input(s): "DDIMER" in the last 72 hours. Hgb A1c No results for input(s): "HGBA1C" in the last 72 hours. Lipid Profile No results for input(s): "CHOL", "HDL", "LDLCALC", "TRIG", "CHOLHDL", "LDLDIRECT" in the last 72 hours. Thyroid  function studies No results for input(s): "TSH", "T4TOTAL", "T3FREE", "THYROIDAB" in the last 72 hours.  Invalid input(s): "FREET3" Anemia work up No results for input(s): "VITAMINB12", "FOLATE", "FERRITIN", "TIBC", "IRON", "RETICCTPCT" in the last 72 hours. Urinalysis    Component Value Date/Time   COLORURINE YELLOW (A) 11/04/2021 0035   APPEARANCEUR HAZY (A) 11/04/2021 0035   LABSPEC 1.016 11/04/2021 0035   PHURINE 7.0 11/04/2021 0035   GLUCOSEU 150 (A) 11/04/2021 0035   HGBUR SMALL (A) 11/04/2021 0035   BILIRUBINUR NEGATIVE 11/04/2021 0035   KETONESUR NEGATIVE 11/04/2021 0035   PROTEINUR >=300 (A) 11/04/2021 0035   NITRITE NEGATIVE 11/04/2021 0035   LEUKOCYTESUR SMALL (A) 11/04/2021 0035   Sepsis Labs Recent Labs  Lab 04/30/24 0700 05/01/24 0612 05/02/24 0459 05/03/24 0629  WBC 9.4 11.6* 10.4 9.0   Microbiology Recent Results (from the past 240 hours)  Resp panel by RT-PCR (RSV, Flu A&B, Covid) Anterior Nasal Swab     Status: None   Collection Time: 04/23/24 12:55 PM   Specimen: Anterior Nasal Swab  Result Value Ref Range Status   SARS Coronavirus 2 by RT PCR NEGATIVE NEGATIVE Final    Comment: (NOTE) SARS-CoV-2 target nucleic acids are NOT DETECTED.  The SARS-CoV-2 RNA is  generally detectable in upper respiratory specimens during the acute phase of infection. The lowest concentration of  SARS-CoV-2 viral copies this assay can detect is 138 copies/mL. A negative result does not preclude SARS-Cov-2 infection and should not be used as the sole basis for treatment or other patient management decisions. A negative result may occur with  improper specimen collection/handling, submission of specimen other than nasopharyngeal swab, presence of viral mutation(s) within the areas targeted by this assay, and inadequate number of viral copies(<138 copies/mL). A negative result must be combined with clinical observations, patient history, and epidemiological information. The expected result is Negative.  Fact Sheet for Patients:  BloggerCourse.com  Fact Sheet for Healthcare Providers:  SeriousBroker.it  This test is no t yet approved or cleared by the [place]  FDA and  has been authorized for detection and/or diagnosis of SARS-CoV-2 by FDA under an Emergency Use Authorization (EUA). This EUA will remain  in effect (meaning this test can be used) for the duration of the COVID-19 declaration under Section 564(b)(1) of the Act, 21 U.S.C.section 360bbb-3(b)(1), unless the authorization is terminated  or revoked sooner.       Influenza A by PCR NEGATIVE NEGATIVE Final   Influenza B by PCR NEGATIVE NEGATIVE Final    Comment: (NOTE) The Xpert Xpress SARS-CoV-2/FLU/RSV plus assay is intended as an aid in the diagnosis of influenza from Nasopharyngeal swab specimens and should not be used as a sole basis for treatment. Nasal washings and aspirates are unacceptable for Xpert Xpress SARS-CoV-2/FLU/RSV testing.  Fact Sheet for Patients: BloggerCourse.com  Fact Sheet for Healthcare Providers: SeriousBroker.it  This test is not yet approved or cleared by the [place] FDA and has been authorized for detection and/or diagnosis of SARS-CoV-2 by FDA under an Emergency Use Authorization (EUA). This EUA will remain in effect (meaning this test can be used) for the duration of the COVID-19 declaration under Section 564(b)(1) of the Act, 21 U.S.C. section 360bbb-3(b)(1), unless the authorization is terminated or revoked.     Resp Syncytial Virus by PCR NEGATIVE NEGATIVE Final    Comment: (NOTE) Fact Sheet for Patients: BloggerCourse.com  Fact Sheet for Healthcare Providers: SeriousBroker.it  This test is not yet approved or cleared by the [place]  FDA and has been authorized for detection and/or diagnosis of SARS-CoV-2 by FDA under an Emergency Use Authorization (EUA). This EUA will remain in effect (meaning this test can be used) for the duration of the COVID-19 declaration under Section 564(b)(1) of the Act, 21 U.S.C. section 360bbb-3(b)(1), unless the authorization is terminated or revoked.  Performed at [facility], [address] [place], [place] [address]   MRSA Next Gen by PCR, Nasal     Status: None   Collection Time: 04/23/24  4:13 PM   Specimen: Nasal Mucosa; Nasal Swab  Result Value Ref Range Status   MRSA by PCR Next Gen NOT DETECTED NOT DETECTED Final    Comment: (NOTE) The GeneXpert MRSA Assay (FDA approved for NASAL specimens only), is one component of a comprehensive MRSA colonization surveillance program. It is not intended to diagnose MRSA infection nor to guide or monitor treatment for MRSA infections. Test performance is not FDA approved in patients less than 77 years old. Performed at [facility], [address] [place], [place] [address]   MRSA Next Gen by PCR, Nasal     Status: None   Collection Time: 05/01/24  2:40 PM   Specimen: Nasal Mucosa; Nasal Swab  Result Value Ref Range Status   MRSA by PCR Next Gen NOT DETECTED NOT DETECTED Final  Comment: (NOTE) The GeneXpert MRSA Assay (FDA approved for NASAL specimens only), is one component of a comprehensive MRSA colonization surveillance program. It is not intended to diagnose MRSA infection nor to guide or monitor treatment for MRSA infections. Test performance is not FDA approved in patients less than 48 years old. Performed at [facility], [address], [place], [place] [address]   Time coordinating discharge: 35 minutes  SIGNED: Lesa Rape, MD  [facility] 05/03/2024, 9:13 AM  If 7PM-7AM, please contact night-coverage [email or web address]

## 2024-05-03 NOTE — Progress Notes (Signed)
 Report given to Evone at [facility].

## 2024-05-03 NOTE — Progress Notes (Signed)
 Transported to facility by [facility]

## 2024-05-03 NOTE — Progress Notes (Signed)
 Attempted to call report to [facility]; receiving nurse is unavailable to take report at this time. Will try again.

## 2024-05-03 NOTE — Progress Notes (Signed)
 Advised by CSW of pt's d/c to snf today. Contacted [facility] but was unable to reach staff and unable to leave a message. D/C summary and yesterday's renal noted faxed to clinic for continuation of care. Will attempt to reach clinic tomorrow due to the possibility of clinic being open on MWF only.   Lauraine Polite Renal Navigator [phone]  Late Note Entry- May 04, 2024 Yesterday's renal note faxed to clinic this morning. Contacted [facility] Deryl Flora FA to be advised of pt's d/c since unable to reach clinic staff yesterday or this morning via phone.

## 2024-05-03 NOTE — TOC Transition Note (Signed)
 Transition of Care [facility]) - Discharge Note   Patient Details  Name: Terry Arroyo MRN: [id number] Date of Birth: 07-01-1961  [facility] CM/SW Contact:  Tandy Fam, LCSW Phone Number: 05/03/2024, 10:49 AM   Clinical Narrative:   CSW noting patient discharged to SNF. CSW sent discharge information to [facility], confirmed they are ready for patient to admit. Requesting patient arrive after 11. CSW spoke with patient, she is in agreement, will need transport arranged. Patient to update family. Transport arranged with [facility] for after 11 per SNF request.   Nurse to call report to [phone] room 807.    Final next level of care: Skilled Nursing Facility Barriers to Discharge: Barriers Resolved   Patient Goals and CMS Choice Patient states their goals for this hospitalization and ongoing recovery are:: Rehab CMS Medicare.gov Compare Post Acute Care list provided to:: Patient Choice offered to / list presented to : Patient [facility] ownership interest in [facility].provided to:: Patient    Discharge Placement              Patient chooses bed at: [facility] [place] Patient to be transferred to facility by: [facility] Name of family member notified: Self Patient and family notified of of transfer: 05/03/24  Discharge Plan and Services Additional resources added to the After Visit Summary for   In-house Referral: Clinical Social Work   Post Acute Care Choice: Skilled Nursing Facility                               Social Drivers of Health (SDOH) Interventions SDOH Screenings   Food Insecurity: No Food Insecurity (04/29/2024)  Housing: Low Risk  (04/29/2024)  Transportation Needs: No Transportation Needs (04/29/2024)  Utilities: Not At Risk (04/29/2024)  Depression (PHQ2-9): Low Risk  (07/08/2022)  Financial Resource Strain: Low Risk  (10/13/2023)   Received from [facility]  Social Connections: Unknown (04/29/2024)   Tobacco Use: Medium Risk (05/01/2024)     Readmission Risk Interventions    05/02/2024   11:03 AM 10/29/2021   10:30 AM  Readmission Risk Prevention Plan  Transportation Screening Complete Complete  Medication Review Oceanographer) Complete Complete  PCP or Specialist appointment within 3-5 days of discharge Complete Complete  HRI or Home Care Consult Complete   SW Recovery Care/Counseling Consult Complete Complete  Palliative Care Screening Not Applicable Not Applicable  Skilled Nursing Facility Complete Not Applicable

## 2024-05-03 NOTE — Progress Notes (Signed)
 [facility] Progress Note   Subjective:   Patient seen in her room this morning. She has some complaints about pain in her left arm and residual weakness. Today, she denies any dyspnea or CP. We are awaiting her OP HD orders from [facility]. Patient is being discharged today to SNF. We did find out her EDW is 75.5 kg.    Objective Vitals:   05/02/24 1921 05/03/24 0007 05/03/24 0331 05/03/24 0810  BP: (!) 144/87 122/65 121/66 107/73  Pulse: 70 70 66 67  Resp: 17 17 18 18   Temp: 98.2 F (36.8 C) 98.7 F (37.1 C) 98 F (36.7 C) 98.1 F (36.7 C)  TempSrc: Oral Oral  Oral  SpO2: 91% 95% 99% 98%  Weight:      Height:       Physical Exam General: Alert, on room air, no acute distress Heart: RRR, no MGR Lungs: Clear to auscultation bilaterally, no wheezing or rales Abdomen: soft, non-tender, non-disteded Extremities: No LE or UE edema Dialysis Access: R AVF +b/t  Additional Objective Labs: Basic Metabolic Panel: Recent Labs  Lab 04/27/24 1758 04/29/24 1255 05/01/24 0612 05/02/24 0459 05/03/24 0629  NA 135   < > 133* 134* 132*  K 4.2   < > 4.4 4.4 4.3  CL 95*   < > 93* 95* 92*  CO2 25   < > 23 23 23   GLUCOSE 139*   < > 174* 79 136*  BUN 36*   < > 37* 44* 29*  CREATININE 5.83*   < > 6.61* 8.08* 6.20*  CALCIUM  8.9   < > 9.1 8.3* 9.1  PHOS 3.5  --   --   --   --    < > = values in this interval not displayed.   Liver Function Tests: Recent Labs  Lab 04/27/24 1758 04/29/24 1255 04/30/24 0700  AST  --  12* 11*  ALT  --  10 10  ALKPHOS  --  79 79  BILITOT  --  0.9 0.4  PROT  --  7.3 7.1  ALBUMIN  3.4* 3.4* 3.3*   No results for input(s): "LIPASE", "AMYLASE" in the last 168 hours. CBC: Recent Labs  Lab 04/29/24 1255 04/29/24 1302 04/30/24 0700 05/01/24 0612 05/02/24 0459 05/03/24 0629  WBC 10.0  --  9.4 11.6* 10.4 9.0  NEUTROABS 7.3  --   --   --  7.8*  --   HGB 10.2*   < > 10.1* 10.9* 9.8* 10.3*  HCT 33.2*   < > 31.3* 34.4* 30.3*  31.4*  MCV 95.1  --  91.8 92.0 91.3 89.5  PLT 267  --  278 317 288 316   < > = values in this interval not displayed.   Blood Culture    Component Value Date/Time   SDES BLOOD LEFT WRIST 02/10/2022 2205   SPECREQUEST  02/10/2022 2205    BOTTLES DRAWN AEROBIC AND ANAEROBIC Blood Culture adequate volume   CULT  02/10/2022 2205    NO GROWTH 5 DAYS Performed at [facility], [address] [place], [place] [address]    REPTSTATUS 02/15/2022 FINAL 02/10/2022 2205    Cardiac Enzymes: No results for input(s): "CKTOTAL", "CKMB", "CKMBINDEX", "TROPONINI" in the last 168 hours. CBG: Recent Labs  Lab 05/02/24 0734 05/02/24 1121 05/02/24 1925 05/02/24 2111 05/03/24 0627  GLUCAP 89 142* 73 166* 140*   Iron Studies: No results for input(s): "IRON", "TIBC", "TRANSFERRIN", "FERRITIN" in the last 72 hours. @lablastinr3 @ Studies/Results: No  results found.  Medications:    (feeding supplement) PROSource Plus  30 mL Oral BID BM   aspirin   81 mg Oral Daily   Or   aspirin   81 mg Per Tube Daily   atorvastatin   80 mg Oral q1800   Chlorhexidine  Gluconate Cloth  6 each Topical Q0600   Chlorhexidine  Gluconate Cloth  6 each Topical Q0600   gabapentin   300 mg Oral QHS   heparin   5,000 Units Subcutaneous Q8H   insulin  aspart  0-15 Units Subcutaneous TID WC   mirtazapine   30 mg Oral QHS   multivitamin  1 tablet Oral Daily   ticagrelor   90 mg Oral BID   Or   ticagrelor   90 mg Per Tube BID   topiramate   50 mg Oral Daily   traZODone   25 mg Oral QHS    Dialysis Orders [facility] MWF Awaiting HD orders from clinic EDW 75.5 at OP HD   Renal-related home meds: Avastin 10 mg daily Coreg  25 twice daily Rena-Vite multivitamin 1 daily Others: Brilinta , trazodone , Remeron , Neurontin , Lipitor , aspirin , Zetia , Topamax , NovoLog  FlexPen   Assessment/Plan: TIA/ recent CVA/ carotid disease: s/p recent thrombectomy and L ICA stenting. Code stroke called on 5/13 due to  disorientation, left arm weakness, left decreased sensation, and dysarthria. CT and MRI negative for new stroke.  ESRD: HD OP at [facility] in [place]. Awaiting HD orders. Last HD 05/02/24 HTN/volume: BP controlled on cleviprex  gtt. Trying to wean down. Patient does have some LE edema. On room air.  Anemia of ESRD: Hgb 9.8 today. Unclear if she is receiving OP ESA. Awaiting orders from [facility].  Secondary HPTH: Corrected Ca 8.9. Add on phos. She is taking Phoslo  2 tablets TID for home binder.  Nutrition: Albumin  3.3. Adding protein supplements. Switched her to Renal diet with fluid restrictions.    Hersey Lorenzo, NP-C 05/03/2024, 2:00 PM  [employer]

## 2024-05-04 LAB — HEPATITIS B SURFACE ANTIBODY, QUANTITATIVE: Hep B S AB Quant (Post): 44.7 m[IU]/mL

## 2024-05-17 ENCOUNTER — Other Ambulatory Visit (INDEPENDENT_AMBULATORY_CARE_PROVIDER_SITE_OTHER): Payer: Self-pay | Admitting: Vascular Surgery

## 2024-05-17 DIAGNOSIS — N186 End stage renal disease: Secondary | ICD-10-CM

## 2024-05-23 NOTE — Progress Notes (Deleted)
 MRN : [id number]  Terry Arroyo is a 63 y.o. (06-Mar-1961) female who presents with chief complaint of check access.  History of Present Illness:   The patient returns to the office for followup status post intervention of their dialysis access on 04/17/2024.   Procedure: Percutaneous transluminal angioplasty and stent placement right arm brachial axillary AV access peripheral segment  Following the intervention the access function has significantly improved, with better flow rates and improved KT/V. The patient has not been experiencing increased bleeding times following decannulation and the patient denies increased recirculation. The patient denies an increase in arm swelling. At the present time the patient denies hand pain.  Patient is also followed for carotid artery stenosis status post left internal carotid artery stent placement the patient denies amaurosis fugax or recent TIA symptoms. There are no recent neurological changes noted.  No recent shortening of the patient's walking distance or new symptoms consistent with claudication.  No history of rest pain symptoms. No new ulcers or wounds of the lower extremities have occurred.  There is no history of DVT, PE or superficial thrombophlebitis. No recent episodes of angina or shortness of breath documented.   Duplex ultrasound of the AV access shows a patent access.  The previously noted stenosis is improved compared to last study.  Flow volume today is *** cc/min (previous flow volume was 1427 cc/min)  Duplex ultrasound of the carotid arteries dated 04/30/2024 shows RICA 40-59% and LICA <50% s/p stent placement  No outpatient medications have been marked as taking for the 05/24/24 encounter (Appointment) with Prescilla Brod, Ninette Basque, MD.    Past Medical History:  Diagnosis Date   ACS (acute coronary syndrome) (HCC) 03/24/2019   Acute ischemic left MCA stroke (HCC) 04/23/2024   Acute on chronic systolic CHF  (congestive heart failure) (HCC) 02/26/2020   Acute pain of left thigh 02/09/2022   Acute pulmonary edema (HCC)    Acute renal failure superimposed on stage 4 chronic kidney disease (HCC)    Anasarca 02/26/2020   Anemia    Angina at rest [facility])    Angioedema 03/20/2021   Lisinopril    Anterior cerebral aneurysm    approximately 5 mm; left MCA    Anxiety    Aortic atherosclerosis (HCC)    Asthma    Benign hypertensive kidney disease with chronic kidney disease 03/10/2020   Blind left eye    CHF (congestive heart failure) (HCC)    COPD (chronic obstructive pulmonary disease) (HCC)    COPD exacerbation (HCC) 10/10/2020   Coronary artery disease    COVID-19 virus infection 03/24/2019   Depression    DOE (dyspnea on exertion)    DVT (deep venous thrombosis) (HCC) 12/30/2016   Left leg dvt as per pt while she was in hospital for cabg  Is on eliquis since august  Doppler done in [place]  Shows   acute  occlusive deep vein  thrombosis  noted in the soleal  vein (a short  segment) at the  mid calf  3/18 doppler is negative     Last Assessment & Plan:   Will rpt doppler   If negative will stop eliquis  Formatting of this note might be different from the original.  Left leg dv   ESRD (end stage renal disease) (HCC)    Gait instability    Genital herpes    GERD (gastroesophageal reflux disease)  HFrEF (heart failure with reduced ejection fraction) (HCC)    History of 2019 novel coronavirus disease (COVID-19) 03/2019   [facility] [place]    History of DVT (deep vein thrombosis)    History of DVT (deep vein thrombosis)    Hx of CABG 2018   x 4; performed in [place]    Hyperlipidemia    Hypertension    Hypertensive urgency 11/24/2019   MI (myocardial infarction) (HCC) 2018   no stents   Pituitary adenoma (HCC)    PVD (posterior vitreous detachment), right eye    Secondary hyperparathyroidism (HCC)    Sepsis (HCC) 09/05/2021   Sleep apnea    non-compliant with nocturnal  PAP therapy   Stroke [facility]) 2014   stated affected left side.   T2DM (type 2 diabetes mellitus) [facility])     Past Surgical History:  Procedure Laterality Date   A/V FISTULAGRAM Right 12/29/2021   Procedure: A/V FISTULAGRAM;  Surgeon: Jackquelyn Mass, MD;  Location: [facility] INVASIVE CV LAB;  Service: Cardiovascular;  Laterality: Right;   A/V FISTULAGRAM Right 06/15/2022   Procedure: A/V Fistulagram;  Surgeon: Jackquelyn Mass, MD;  Location: [facility] INVASIVE CV LAB;  Service: Cardiovascular;  Laterality: Right;   A/V FISTULAGRAM Right 04/17/2024   Procedure: A/V Fistulagram;  Surgeon: Jackquelyn Mass, MD;  Location: [facility] INVASIVE CV LAB;  Service: Cardiovascular;  Laterality: Right;   AV FISTULA PLACEMENT Right 01/28/2021   Procedure: ARTERIOVENOUS (AV) FISTULA CREATION;  Surgeon: Jackquelyn Mass, MD;  Location: [facility] ORS;  Service: Vascular;  Laterality: Right;   AV FISTULA PLACEMENT Right 05/13/2021   Procedure: INSERTION OF ARTERIOVENOUS (AV) GORE-TEX GRAFT ARM ( BRACHIAL AXILLARY );  Surgeon: Jackquelyn Mass, MD;  Location: [facility] ORS;  Service: Vascular;  Laterality: Right;   BRAIN SURGERY  1986   pituitary tumor   BREAST EXCISIONAL BIOPSY Left yrs ago   benign   CESAREAN SECTION     COLONOSCOPY N/A 05/26/2023   Procedure: COLONOSCOPY;  Surgeon: Quintin Buckle, DO;  Location: [facility] ENDOSCOPY;  Service: Gastroenterology;  Laterality: N/A;  DIALYSIS   CORONARY ARTERY BYPASS GRAFT  2018   x 4 vessels; performed in [place]    DIALYSIS/PERMA CATHETER INSERTION N/A 11/07/2020   Procedure: DIALYSIS/PERMA CATHETER INSERTION;  Surgeon: Celso College, MD;  Location: [facility] INVASIVE CV LAB;  Service: Cardiovascular;  Laterality: N/A;   DIALYSIS/PERMA CATHETER REMOVAL N/A 06/12/2021   Procedure: DIALYSIS/PERMA CATHETER REMOVAL;  Surgeon: Celso College, MD;  Location: [facility] INV[facility] CV LAB;  Service: Cardiovascular;  Laterality: N/A;   ESOPHAGOGASTRODUODENOSCOPY (EGD) WITH PROPOFOL  N/A 12/22/2021    Procedure: ESOPHAGOGASTRODUODENOSCOPY (EGD) WITH PROPOFOL ;  Surgeon: Shane Darling, MD;  Location: [facility] ENDOSCOPY;  Service: Gastroenterology;  Laterality: N/A;  IDDM   EYE SURGERY Right 2021   cataracts    IR ANGIO INTRA EXTRACRAN SEL COM CAROTID INNOMINATE UNI R MOD SED  05/01/2024   IR CT HEAD LTD  04/23/2024   IR PERCUTANEOUS ART THROMBECTOMY/INFUSION INTRACRANIAL INC DIAG ANGIO  04/23/2024   IR US  GUIDE VASC ACCESS RIGHT  04/23/2024   PERIPHERAL VASCULAR THROMBECTOMY Right 04/03/2021   Procedure: A/V Fistulagram ;  Surgeon: Jackquelyn Mass, MD;  Location: [facility] INVAS[facility] CV LAB;  Service: Cardiovascular;  Laterality: Right;   RADIOLOGY WITH ANESTHESIA N/A 04/23/2024   Procedure: RADIOLOGY WITH ANESTHESIA;  Surgeon: Radiologist, Medication, MD;  Location: [facility] OR;  Service: Radiology;  Laterality: N/A;   RADIOLOGY WITH ANESTHESIA N/A 05/01/2024   Procedure: RADIOLOGY WITH  ANESTHESIA;  Surgeon: Luellen Sages, MD;  Location: [facility] OR;  Service: Radiology;  Laterality: N/A;  Right carotid revascularization    Social History Social History   Tobacco Use   Smoking status: Former    Current packs/day: 0.00    Types: Cigarettes    Quit date: 2018    Years since quitting: 7.4   Smokeless tobacco: Never  Vaping Use   Vaping status: Never Used  Substance Use Topics   Alcohol  use: Not Currently   Drug use: Not Currently    Family History Family History  Problem Relation Age of Onset   CAD Mother    CAD Brother    Breast cancer Sister 37    Allergies  Allergen Reactions   Hydralazine  Hives, Shortness Of Breath, Swelling and Rash    Body aches, also    Kiwi Extract Hives, Swelling, Rash and Other (See Comments)    Mouth swells    Lisinopril  Swelling and Other (See Comments)    Angioedema   Shellfish Allergy Anaphylaxis and Other (See Comments)    Shrimp/lobster    Betadine [Povidone Iodine ] Hives and Rash   Septra [Sulfamethoxazole-Trimethoprim] Other (See Comments)     Reaction not stated   Metformin Diarrhea and Other (See Comments)    GI Intolerance   Tape Itching    Use paper tape whenever possible     REVIEW OF SYSTEMS (Negative unless checked)  Constitutional: [] Weight loss  [] Fever  [] Chills Cardiac: [] Chest pain   [] Chest pressure   [] Palpitations   [] Shortness of breath when laying flat   [] Shortness of breath with exertion. Vascular:  [] Pain in legs with walking   [] Pain in legs at rest  [] History of DVT   [] Phlebitis   [] Swelling in legs   [] Varicose veins   [] Non-healing ulcers Pulmonary:   [] Uses home oxygen   [] Productive cough   [] Hemoptysis   [] Wheeze  [] COPD   [] Asthma Neurologic:  [] Dizziness   [] Seizures   [] History of stroke   [] History of TIA  [] Aphasia   [] Vissual changes   [] Weakness or numbness in arm   [] Weakness or numbness in leg Musculoskeletal:   [] Joint swelling   [] Joint pain   [] Low back pain Hematologic:  [] Easy bruising  [] Easy bleeding   [] Hypercoagulable state   [] Anemic Gastrointestinal:  [] Diarrhea   [] Vomiting  [] Gastroesophageal reflux/heartburn   [] Difficulty swallowing. Genitourinary:  [x] Chronic kidney disease   [] Difficult urination  [] Frequent urination   [] Blood in urine Skin:  [] Rashes   [] Ulcers  Psychological:  [] History of anxiety   []  History of major depression.  Physical Examination  There were no vitals filed for this visit. There is no height or weight on file to calculate BMI. Gen: WD/WN, NAD Head: [place]/AT, No temporalis wasting.  Ear/Nose/Throat: Hearing grossly intact, nares w/o erythema or drainage Eyes: PER, EOMI, sclera nonicteric.  Neck: Supple, no gross masses or lesions.  No JVD.  Pulmonary:  Good air movement, no audible wheezing, no use of accessory muscles.  Cardiac: RRR, precordium non-hyperdynamic. Vascular:   *** Vessel Right Left  Radial Palpable Palpable  Brachial Palpable Palpable  Gastrointestinal: soft, non-distended. No guarding/no peritoneal signs.  Musculoskeletal: M/S  5/5 throughout.  No deformity.  Neurologic: CN 2-12 intact. Pain and light touch intact in extremities.  Symmetrical.  Speech is fluent. Motor exam as listed above. Psychiatric: Judgment intact, Mood & affect appropriate for pt's clinical situation. Dermatologic: No rashes or ulcers noted.  No changes consistent with cellulitis.  CBC Lab Results  Component Value Date   WBC 9.0 05/03/2024   HGB 10.3 (L) 05/03/2024   HCT 31.4 (L) 05/03/2024   MCV 89.5 05/03/2024   PLT 316 05/03/2024    BMET    Component Value Date/Time   NA 132 (L) 05/03/2024 0629   NA 134 (L) 11/18/2013 0138   K 4.3 05/03/2024 0629   K 3.8 11/18/2013 0138   CL 92 (L) 05/03/2024 0629   CL 98 11/18/2013 0138   CO2 23 05/03/2024 0629   CO2 29 11/18/2013 0138   GLUCOSE 136 (H) 05/03/2024 0629   GLUCOSE 348 (H) 11/18/2013 0138   BUN 29 (H) 05/03/2024 0629   BUN 11 11/18/2013 0138   CREATININE 6.20 (H) 05/03/2024 0629   CREATININE 0.87 11/18/2013 0138   CALCIUM  9.1 05/03/2024 0629   CALCIUM  9.4 11/18/2013 0138   GFRNONAA 7 (L) 05/03/2024 0629   GFRNONAA >60 11/18/2013 0138   GFRAA 18 (L) 09/15/2020 0403   GFRAA >60 11/18/2013 0138   CrCl cannot be calculated (Unknown ideal weight.).  COAG Lab Results  Component Value Date   INR 1.1 04/30/2024   INR 1.1 04/29/2024   INR 1.1 04/24/2024    Radiology VAS US  CAROTID Result Date: 05/04/2024 Carotid Arterial Duplex Study Patient Name:  Terry Arroyo  Date of Exam:   04/30/2024 Medical Rec #: [id number]        Accession #:    [id number] Date of Birth: [birth year]         Patient Gender: F Patient Age:   12 years Exam Location:  [facility] Procedure:      VAS US  CAROTID Referring Phys: Authur Leghorn MELVIN --------------------------------------------------------------------------------  Indications:       CVA. Risk Factors:      Hypertension, hyperlipidemia, Diabetes. Other Factors:     Left ICA stent. Comparison Study:  04/25/2024 - Right Carotid: Velocities in  the right ICA are                    consistent with an upper range 1-39% stenosis.                     Left Carotid: Left ICA stent is patent with elevated                    velocities suggestive                    of 50-74% stenosis.                     Vertebrals: Bilateral vertebral arteries demonstrate                    antegrade flow.                    Subclavians: Right subclavian artery was stenotic. Normal                    flow                    hemodynamics were seen in the left subclavian artery. Performing Technologist: Lerry Ransom RVT  Examination Guidelines: A complete evaluation includes B-mode imaging, spectral Doppler, color Doppler, and power Doppler as needed of all accessible portions of each vessel. Bilateral testing is considered an integral part of a complete examination. Limited examinations for reoccurring indications may be performed as noted.  Right Carotid Findings: +----------+--------+--------+--------+--------------------------+--------+           PSV cm/sEDV cm/sStenosisPlaque Description        Comments +----------+--------+--------+--------+--------------------------+--------+ CCA Prox  123     13              smooth and heterogenous            +----------+--------+--------+--------+--------------------------+--------+ CCA Distal54      9               irregular and heterogenous         +----------+--------+--------+--------+--------------------------+--------+ ICA Prox  96      19              irregular and heterogenous         +----------+--------+--------+--------+--------------------------+--------+ ICA Mid   157     44      40-59%  irregular and heterogenous         +----------+--------+--------+--------+--------------------------+--------+ ICA Distal107     26                                        tortuous +----------+--------+--------+--------+--------------------------+--------+ ECA       95      1                                                   +----------+--------+--------+--------+--------------------------+--------+ +----------+--------+-------+--------+-------------------+           PSV cm/sEDV cmsDescribeArm Pressure (mmHG) +----------+--------+-------+--------+-------------------+ [id number]     106                                +----------+--------+-------+--------+-------------------+ +---------+--------+--+--------+--+---------+ VertebralPSV cm/s43EDV cm/s10Antegrade +---------+--------+--+--------+--+---------+ Elevated velocities are noted in the subclavian artery. Left Carotid Findings: +----------+--------+--------+--------+-----------------------+--------+           PSV cm/sEDV cm/sStenosisPlaque Description     Comments +----------+--------+--------+--------+-----------------------+--------+ CCA Prox  73      11              smooth and heterogenous         +----------+--------+--------+--------+-----------------------+--------+ CCA Distal66      12              smooth and heterogenous         +----------+--------+--------+--------+-----------------------+--------+ ICA Prox  76      17                                     Stent    +----------+--------+--------+--------+-----------------------+--------+ ICA Mid   112     31                                     Stent    +----------+--------+--------+--------+-----------------------+--------+ ICA Distal111     24                                     Stent    +----------+--------+--------+--------+-----------------------+--------+ ECA       63      10                                              +----------+--------+--------+--------+-----------------------+--------+ +----------+--------+--------+--------+-------------------+  PSV cm/sEDV cm/sDescribeArm Pressure (mmHG) +----------+--------+--------+--------+-------------------+ Subclavian131                                          +----------+--------+--------+--------+-------------------+ +---------+--------+--+--------+--+---------+ VertebralPSV cm/s66EDV cm/s17Antegrade +---------+--------+--+--------+--+---------+  Left Stent(s): +---------------+---+--++++ Prox to Stent  61 11 +---------------+---+--++++ Proximal Stent 76 17 +---------------+---+--++++ Mid Stent      11231 +---------------+---+--++++ Distal Stent   11124 +---------------+---+--++++ Distal to Stent54 20 +---------------+---+--++++    Summary: Right Carotid: Velocities in the right ICA are consistent with a 40-59%                stenosis. Left Carotid: Left ICA stent is patent with less than 50% stenosis by velocity. Vertebrals: Bilateral vertebral arteries demonstrate antegrade flow. *See table(s) above for measurements and observations.  Electronically signed by Ardella Beaver MD on 05/04/2024 at 10:19:42 AM.    Final    VAS US  CAROTID Result Date: 05/04/2024 Carotid Arterial Duplex Study Patient Name:  Terry Arroyo  Date of Exam:   04/25/2024 Medical Rec #: [id number]        Accession #:    [id number] Date of Birth: 31-May-1961         Patient Gender: F Patient Age:   38 years Exam Location:  [facility] Procedure:      VAS US  CAROTID Referring Phys: Margart Shears DE LA TORRE --------------------------------------------------------------------------------  Indications:       CVA. Risk Factors:      Hypertension, hyperlipidemia, prior MI, coronary artery                    disease, prior CVA, PAD. Comparison Study:  04/23/2024 CTA neck- 1. Negative for large vessel occlusion,                    But Positive for VERY SEVERE underlying atherosclerosis with:                    - LICA origin and bilateral ICA siphon RADIOGRAPHIC STRING                    SIGN STENOSES.                    - Right ICA distal bulb high-grade stenosis, estimated 70-80%                    and                    approaching a radiographic string sign.  Performing Technologist: Delford Felling MHA, RDMS, RVT, RDCS  Examination Guidelines: A complete evaluation includes B-mode imaging, spectral Doppler, color Doppler, and power Doppler as needed of all accessible portions of each vessel. Bilateral testing is considered an integral part of a complete examination. Limited examinations for reoccurring indications may be performed as noted.  Right Carotid Findings: +----------+--------+--------+--------+-----------------------+--------+           PSV cm/sEDV cm/sStenosisPlaque Description     Comments +----------+--------+--------+--------+-----------------------+--------+ CCA Prox  123     14                                              +----------+--------+--------+--------+-----------------------+--------+ CCA Distal79      8  heterogenous and smooth         +----------+--------+--------+--------+-----------------------+--------+ ICA Prox  170     36      1-39%   smooth and heterogenous         +----------+--------+--------+--------+-----------------------+--------+ ICA Mid   126     24                                              +----------+--------+--------+--------+-----------------------+--------+ ICA Distal70      12                                              +----------+--------+--------+--------+-----------------------+--------+ ECA       116                                                     +----------+--------+--------+--------+-----------------------+--------+ +----------+--------+-------+--------+--------------------+           PSV cm/sEDV cmsDescribeArm Pressure (mmHG)  +----------+--------+-------+--------+--------------------+ [id number]            StenoticRestricted extremity +----------+--------+-------+--------+--------------------+ +---------+--------+--+--------+-+---------+ VertebralPSV cm/s34EDV cm/s4Antegrade +---------+--------+--+--------+-+---------+   Left Carotid Findings: +----------+--------+--------+--------+------------------+--------+           PSV cm/sEDV cm/sStenosisPlaque DescriptionComments +----------+--------+--------+--------+------------------+--------+ CCA Prox  77      9                                          +----------+--------+--------+--------+------------------+--------+ CCA Distal                                          stent    +----------+--------+--------+--------+------------------+--------+ ICA Prox                                            stent    +----------+--------+--------+--------+------------------+--------+ ICA Mid                                             stent    +----------+--------+--------+--------+------------------+--------+ ICA Distal104     24                                         +----------+--------+--------+--------+------------------+--------+ ECA       287     13                                         +----------+--------+--------+--------+------------------+--------+ +----------+--------+--------+----------------+-------------------+           PSV cm/sEDV cm/sDescribe        Arm Pressure (mmHG) +----------+--------+--------+----------------+-------------------+ [id number]  Multiphasic, [id number]                 +----------+--------+--------+----------------+-------------------+ +---------+--------+---+--------+--+---------+ VertebralPSV cm/s101EDV cm/s23Antegrade +---------+--------+---+--------+--+---------+  Left Stent(s): +---------------+--------+--------+--------+--------+--------+ ICA            PSV cm/sEDV cm/sStenosisWaveformComments +---------------+--------+--------+--------+--------+--------+ Prox to Stent  96      17                               +---------------+--------+--------+--------+--------+--------+ Proximal Stent 82      12                                +---------------+--------+--------+--------+--------+--------+ Mid Stent      227     27                               +---------------+--------+--------+--------+--------+--------+ Distal Stent   101     24                               +---------------+--------+--------+--------+--------+--------+ Distal to Stent104     24                               +---------------+--------+--------+--------+--------+--------+    Summary: Right Carotid: Velocities in the right ICA are consistent with an upper range                1-39% stenosis. Left Carotid: Velocities in the left ICA are consistent with a 1-39% stenosis.               Left ICA stent is patent with elevated velocities suggestive of               50-79% stenosis. Vertebrals:  Bilateral vertebral arteries demonstrate antegrade flow. Subclavians: Right subclavian artery was stenotic. Normal flow hemodynamics were              seen in the left subclavian artery. *See table(s) above for measurements and observations.  Electronically signed by Ardella Beaver MD on 05/04/2024 at 10:18:59 AM.    Final    IR ANGIO INTRA EXTRACRAN SEL COM CAROTID INNOMINATE UNI R MOD SED Result Date: 05/03/2024 CLINICAL DATA:  Left-sided numbness, tingling and weakness. CT angiogram of the head and neck suspicious of high-grade stenosis of the proximal right ICA. EXAM: IR ANGIO INTRA EXTRACRAN SEL COM CAROTID INNOMINATE UNI RIGHT MOD SED COMPARISON:  CT angiogram of the head and neck of Apr 23, 2024. MEDICATIONS: Heparin  none units IV. No antibiotic was administered within 1 hour of the procedure. ANESTHESIA/SEDATION: General anesthesia. CONTRAST:  Omnipaque  300 approximately 20 mL. FLUOROSCOPY TIME:  Fluoroscopy Time: 4 minutes 0 seconds (288 mGy). COMPLICATIONS: None immediate. TECHNIQUE: Informed written consent was obtained from the patient after a thorough discussion of the procedural risks, benefits and alternatives. All questions were addressed. Maximal  Sterile Barrier Technique was utilized including caps, mask, sterile gowns, sterile gloves, sterile drape, hand hygiene and skin antiseptic. A timeout was performed prior to the initiation of the procedure. The right groin was prepped and draped in the usual sterile fashion. Thereafter using modified Seldinger technique, transfemoral access into the right common femoral artery was obtained without difficulty. Over an 0.035 inch guidewire, a 9 French 15 cm  Pinnacle sheath was inserted. Through this, and also over an 0.035 inch guidewire, a 5 [place] JB 1 catheter was advanced to the aortic arch region and selectively positioned in the right common carotid artery. FINDINGS: Right common carotid arteriogram demonstrates mild stenosis at the origin of the right external carotid artery. Its branches opacified normally. The right internal carotid artery at the bulb origin has mild stenosis. Just distal to the bulb there is a smooth stenosis without evidence of intraluminal filling defects, or of irregularities. This is approximately 50%. Distal to this there is a double U shaped configuration of the right internal carotid artery at the junction of the middle and the proximal cervical right ICA. No evidence of kinking noted. Distal to this the vessel opacifies widely to the cranial skull base. Approximately 50% stenosis is seen of the proximal cavernous right ICA on the AP oblique and lateral projections. Distal to this the distal cavernous, and the supraclinoid right ICA are widely patent. The right middle cerebral artery and the right anterior cerebral artery opacify into the capillary and venous phases. Unopacified blood is seen in the right anterior cerebral artery from the contralateral left ICA via the anterior communicating artery. Given the above findings, no endovascular treatment was rendered. An 8 French Angio-Seal closure device was used for hemostasis at the right groin puncture site. Distal pulses remained  Dopplerable unchanged from prior to the procedure. The patient's general anesthesia was reversed. Patient was then extubated without any difficulty. Upon recovery, the patient returned to her pre angiographic neurological status. The patient was communicative and moving all fours as previously. She denied any headaches, nausea, vomiting or left-sided weakness. IMPRESSION: Approximately 50% stenosis of the right internal carotid artery proximally secondary to a smooth circumferential atherosclerotic plaque. Approximately 50% stenosis of the proximal cavernous right ICA probably related to intracranial arteriosclerosis. PLAN: As per referring MD. Electronically Signed   By: Luellen Sages M.D.   On: 05/03/2024 09:01   VAS US  UPPER EXTREMITY VENOUS DUPLEX Result Date: 05/01/2024 UPPER VENOUS STUDY  Patient Name:  Terry Arroyo  Date of Exam:   05/01/2024 Medical Rec #: [id number]        Accession #:    [id number] Date of Birth: 10/15/1961         Patient Gender: F Patient Age:   38 years Exam Location:  [facility] Procedure:      VAS US  UPPER EXTREMITY VENOUS DUPLEX Referring Phys: RAMESH KC --------------------------------------------------------------------------------  Indications: Pain Risk Factors: None identified. Comparison Study: No prior studies. Performing Technologist: Lerry Ransom RVT  Examination Guidelines: A complete evaluation includes B-mode imaging, spectral Doppler, color Doppler, and power Doppler as needed of all accessible portions of each vessel. Bilateral testing is considered an integral part of a complete examination. Limited examinations for reoccurring indications may be performed as noted.  Right Findings: +----------+------------+---------+-----------+----------+-------+ RIGHT     CompressiblePhasicitySpontaneousPropertiesSummary +----------+------------+---------+-----------+----------+-------+ Subclavian    Full       Yes       Yes                       +----------+------------+---------+-----------+----------+-------+  Left Findings: +----------+------------+---------+-----------+----------+-------+ LEFT      CompressiblePhasicitySpontaneousPropertiesSummary +----------+------------+---------+-----------+----------+-------+ IJV           Full       Yes       Yes                      +----------+------------+---------+-----------+----------+-------+  Subclavian               Yes       Yes                      +----------+------------+---------+-----------+----------+-------+ Axillary      Full       Yes       Yes                      +----------+------------+---------+-----------+----------+-------+ Brachial      Full                                          +----------+------------+---------+-----------+----------+-------+ Radial        Full                                          +----------+------------+---------+-----------+----------+-------+ Ulnar         Full                                          +----------+------------+---------+-----------+----------+-------+ Cephalic      Full                                          +----------+------------+---------+-----------+----------+-------+ Basilic       Full                                          +----------+------------+---------+-----------+----------+-------+  Summary:  Right: No evidence of thrombosis in the subclavian.  Left: No evidence of deep vein thrombosis in the upper extremity. No evidence of superficial vein thrombosis in the upper extremity.  *See table(s) above for measurements and observations.  Diagnosing physician: Delaney Fearing Electronically signed by Delaney Fearing on 05/01/2024 at 4:07:03 PM.    Final    MR BRAIN WO CONTRAST Result Date: 05/01/2024 CLINICAL DATA:  63 year old female code stroke presentation this morning. Dialysis patient. Severe atherosclerosis in the head and neck on CTA this month. EXAM: MRI HEAD  WITHOUT CONTRAST LIMITED TECHNIQUE: Multiplanar, multiecho pulse sequences of the brain and surrounding structures were obtained without intravenous contrast. COMPARISON:  Plain head CT 0847 hours. CT head, CTA, CTP 04/23/24. FINDINGS: Neurology requests a study limited to DWI, sagittal T1 and axial FLAIR imaging. No restricted diffusion to suggest acute infarction. No midline shift, mass effect, evidence of mass lesion, ventriculomegaly, extra-axial collection or acute intracranial hemorrhage. Cervicomedullary junction and pituitary are within normal limits. Axial FLAIR imaging demonstrates scattered, generally small hyperintense foci in the bilateral cerebral white matter. The extent is mild to moderate for age. No cortical encephalomalacia identified. Incidental midline nasopharyngeal retention cysts (Tornwaldt cyst, normal variant). Negative for age visible cervical spine. IMPRESSION: Negative for acute infarct. No evidence of acute intracranial abnormality on limited exam. Electronically Signed   By: Marlise Simpers M.D.   On: 05/01/2024 10:26   CT HEAD CODE STROKE WO CONTRAST Result Date: 05/01/2024 CLINICAL DATA:  Code stroke. Neuro deficit,  concern for stroke, left arm weakness. EXAM: CT HEAD WITHOUT CONTRAST TECHNIQUE: Contiguous axial images were obtained from the base of the skull through the vertex without intravenous contrast. RADIATION DOSE REDUCTION: This exam was performed according to the departmental dose-optimization program which includes automated exposure control, adjustment of the mA and/or kV according to patient size and/or use of iterative reconstruction technique. COMPARISON:  CT head and MRI head 04/29/2024. FINDINGS: Brain: No acute intracranial hemorrhage. No CT evidence of acute infarct. No edema, mass effect, or midline shift. The basilar cisterns are patent. Ventricles: The ventricles are normal. Vascular: Atherosclerotic calcifications of the carotid siphons and intracranial vertebral  arteries. No hyperdense vessel. Skull: No acute or aggressive finding. Orbits: Right lens replacement.  Orbits otherwise unremarkable. Sinuses: Mild mucosal thickening in the left posterior ethmoid air cells. Postsurgical changes of the sphenoid sinuses. Other: Trace fluid in the right mastoid air cells. ASPECTS [facility] Stroke Program Early CT Score) - Ganglionic level infarction (caudate, lentiform nuclei, internal capsule, insula, M1-M3 cortex): 7 - Supraganglionic infarction (M4-M6 cortex): 3 Total score (0-10 with 10 being normal): 10 IMPRESSION: 1. No CT evidence of acute intracranial abnormality. 2. ASPECTS is 10 These results were communicated to Dr. Christiane Cowing at 9:06 am on 05/01/2024 by text page via the [facility] messaging system. Electronically Signed   By: Denny Flack M.D.   On: 05/01/2024 09:07   US  LT UPPER EXTREM LTD SOFT TISSUE NON VASCULAR Result Date: 04/30/2024 CLINICAL DATA:  Elbow pain and swelling.  Joint effusion. EXAM: ULTRASOUND LEFT UPPER EXTREMITY LIMITED TECHNIQUE: Ultrasound examination of the upper extremity soft tissues was performed in the area of clinical concern. COMPARISON:  Elbow radiographs 04/29/2024 and 04/30/2024. CT 04/30/2024. FINDINGS: Examination was targeted to the posterior aspect of the left elbow. There is generalized subcutaneous edema, as seen on CT. A focal fluid collection posteriorly measures approximately 3.6 x 1.4 x 2.4 cm. This has a deep location adjacent to bone, and as correlated with same day CT likely represents the known elbow joint effusion. No other focal periarticular fluid collections are identified. IMPRESSION: Focal fluid collection posterior to the left elbow likely represents the known elbow joint effusion. No other focal periarticular fluid collections identified. If there is concern of septic arthritis, consider further evaluation with MRI with and without contrast or arthrocentesis. Electronically Signed   By: Elmon Hagedorn M.D.   On: 04/30/2024  21:44   CT ELBOW LEFT WO CONTRAST Result Date: 04/30/2024 CLINICAL DATA:  Bone lesion, elbow, incidental Elbow pain and swelling with joint effusion on radiographs. EXAM: CT OF THE UPPER LEFT EXTREMITY WITHOUT CONTRAST TECHNIQUE: Multidetector CT imaging of the left elbow was performed according to the standard protocol. RADIATION DOSE REDUCTION: This exam was performed according to the departmental dose-optimization program which includes automated exposure control, adjustment of the mA and/or kV according to patient size and/or use of iterative reconstruction technique. COMPARISON:  Elbow radiographs 04/29/2024 and 04/30/2024. Ultrasound 04/30/2024. FINDINGS: Bones/Joint/Cartilage No evidence of acute fracture, dislocation or bone destruction. Mild spurring of the coronoid process. There is a moderate-sized elbow joint effusion without evidence of erosive disease or intra-articular air. Ligaments Suboptimally assessed by CT. Muscles and Tendons No intramuscular fluid collection or focal atrophy identified on noncontrast imaging. As evaluated by CT, the biceps and triceps tendons appear intact. Soft tissues Dorsal subcutaneous edema within the distal upper arm, extending posterior to the elbow and into the dorsal aspect of the proximal forearm. There may be a small amount of fluid within  the olecranon bursa. Small amount of air in the antecubital fossa without associated focal fluid collection, likely related to a peripheral IV or reported recent contrast extravasation. IMPRESSION: 1. Moderate-sized elbow joint effusion without evidence of erosive disease or intra-articular air. This is nonspecific and could be degenerative or inflammatory. If there is clinical concern for septic arthritis, consider arthrocentesis. 2. Dorsal subcutaneous edema within the distal upper arm, extending posterior to the elbow and into the dorsal aspect of the proximal forearm. No associated focal fluid collection. 3. Small amount of  air in the antecubital fossa without associated focal fluid collection, likely related to a peripheral IV or reported recent contrast extravasation. 4. No acute osseous findings or evidence of osteomyelitis. Electronically Signed   By: Elmon Hagedorn M.D.   On: 04/30/2024 21:34   DG Elbow 2 Views Left Result Date: 04/30/2024 CLINICAL DATA:  Pain and swelling after an IV insertion. EXAM: LEFT ELBOW - 2 VIEW COMPARISON:  04/29/2024 FINDINGS: Soft tissue swelling over the olecranon. Mild subcutaneous edema in the posterior aspect of the distal upper arm. No acute osseous abnormality. IMPRESSION: 1. Edema over the olecranon may be related to extravasated contrast material or olecranon bursitis. 2. Subcutaneous edema in the dorsal aspect of the distal upper arm, compatible with recent contrast extravasation. Electronically Signed   By: Shearon Denis M.D.   On: 04/30/2024 19:27   DG Elbow 2 Views Left Result Date: 04/29/2024 CLINICAL DATA:  Pain elbow pain EXAM: LEFT ELBOW - 2 VIEW COMPARISON:  CTA chest, abdomen, and pelvis dated 04/23/2024 FINDINGS: Ill-defined density dispersed through the subcutaneous soft tissues of the partially imaged upper arm overlying the ulnar aspect of the humerus. No acute dislocation. Subtle cortical angulation at the level of the radial neck. Moderate elbow joint effusion. IMPRESSION: 1. Subtle cortical angulation at the level of the radial neck, which may represent a nondisplaced fracture in the appropriate clinical setting. 2. Moderate elbow joint effusion. 3. Ill-defined density dispersed through the subcutaneous soft tissues of the partially imaged upper arm overlying the ulnar aspect of the humerus, which may represent extravasated contrast material. The patient underwent a contrast-enhanced CT examination on 04/23/2024 with contrast instillation via the left upper extremity. Electronically Signed   By: Limin  Xu M.D.   On: 04/29/2024 13:22   MR BRAIN WO CONTRAST Result  Date: 04/29/2024 CLINICAL DATA:  Neuro deficit, acute, stroke suspected EXAM: MRI HEAD WITHOUT CONTRAST MRA HEAD WITHOUT CONTRAST MRA NECK WITHOUT CONTRAST TECHNIQUE: Multiplanar, multiecho pulse sequences of the brain and surrounding structures were obtained without intravenous contrast. Angiographic images of the Circle of Willis were obtained using MRA technique without intravenous contrast. Angiographic images of the neck were obtained using MRA technique without intravenous contrast. Carotid stenosis measurements (when applicable) are obtained utilizing NASCET criteria, using the distal internal carotid diameter as the denominator. COMPARISON:  CTA 04/23/2024, MRI head 12/07/2023 FINDINGS: MRI HEAD Motion artifact. Brain: There is no acute infarction or intracranial hemorrhage. There is no intracranial mass, mass effect, or edema. There is no hydrocephalus or extra-axial fluid collection. Ventricles and sulci are stable in size and configuration. Patchy foci of T2 hyperintensity in the supratentorial white matter are nonspecific but may reflect stable mild chronic microvascular ischemic changes. Vascular: Major vessel flow voids at the skull base are preserved. Skull and upper cervical spine: Normal marrow signal is preserved. Sinuses/Orbits: Postoperative changes with minimal patchy mucosal thickening. Right lens replacement. Other: Evidence of prior sellar mass resection minimal patchy mastoid fluid opacification. Retention  cysts along the posterior wall of the nasopharynx. MRA HEAD Motion artifact. Intracranial internal carotid arteries are patent. Atherosclerotic irregularity with stenosis. There is up to marked stenosis of the right cavernous ICA and moderate marked stenosis of the left paraclinoid ICA, likely similar to prior CTA. Middle and anterior cerebral arteries are patent. 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 Intracranial vertebral arteries, basilar artery, posterior cerebral  arteries are patent. Bilateral posterior communicating arteries are present. No significant stenosis. MRA NECK Motion artifact. Right common, internal, and external carotid arteries are patent. There is plaque without hemodynamically significant stenosis at the ICA origin. Subsequent circumferential narrowing of the proximal ICA causing greater than 70% stenosis. Likely no significant change from the prior CTA. Left common carotid artery is patent. There is loss of flow at the distal CCA and proximal ICA that may be related to interval stent placement. Mid to distal cervical ICA is patent. Vertebral arteries are patent. There is limited evaluation proximally due to artifact. No significant stenosis identified. IMPRESSION: Degraded by motion. No acute infarction, hemorrhage, or mass. No significant change since the prior brain MRI. Loss of flow related enhancement of the distal CCA and proximal ICA is probably related to artifact from interval stent. Greater than 70% stenosis of the proximal right ICA is probably similar to the prior CTA. Right greater than left intracranial ICA atherosclerosis and stenosis is likely similar to the prior CTA. 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 Electronically Signed   By: Geannie Keener M.D.   On: 04/29/2024 12:38   MR ANGIO HEAD WO CONTRAST Result Date: 04/29/2024 CLINICAL DATA:  Neuro deficit, acute, stroke suspected EXAM: MRI HEAD WITHOUT CONTRAST MRA HEAD WITHOUT CONTRAST MRA NECK WITHOUT CONTRAST TECHNIQUE: Multiplanar, multiecho pulse sequences of the brain and surrounding structures were obtained without intravenous contrast. Angiographic images of the Circle of Willis were obtained using MRA technique without intravenous contrast. Angiographic images of the neck were obtained using MRA technique without intravenous contrast. Carotid stenosis measurements (when applicable) are obtained utilizing NASCET criteria, using the distal internal carotid  diameter as the denominator. COMPARISON:  CTA 04/23/2024, MRI head 12/07/2023 FINDINGS: MRI HEAD Motion artifact. Brain: There is no acute infarction or intracranial hemorrhage. There is no intracranial mass, mass effect, or edema. There is no hydrocephalus or extra-axial fluid collection. Ventricles and sulci are stable in size and configuration. Patchy foci of T2 hyperintensity in the supratentorial white matter are nonspecific but may reflect stable mild chronic microvascular ischemic changes. Vascular: Major vessel flow voids at the skull base are preserved. Skull and upper cervical spine: Normal marrow signal is preserved. Sinuses/Orbits: Postoperative changes with minimal patchy mucosal thickening. Right lens replacement. Other: Evidence of prior sellar mass resection minimal patchy mastoid fluid opacification. Retention cysts along the posterior wall of the nasopharynx. MRA HEAD Motion artifact. Intracranial internal carotid arteries are patent. Atherosclerotic irregularity with stenosis. There is up to marked stenosis of the right cavernous ICA and moderate marked stenosis of the left paraclinoid ICA, likely similar to prior CTA. Middle and anterior cerebral arteries are patent. 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 Intracranial vertebral arteries, basilar artery, posterior cerebral arteries are patent. Bilateral posterior communicating arteries are present. No significant stenosis. MRA NECK Motion artifact. Right common, internal, and external carotid arteries are patent. There is plaque without hemodynamically significant stenosis at the ICA origin. Subsequent circumferential narrowing of the proximal ICA causing greater than 70% stenosis. Likely no significant change from the prior  CTA. Left common carotid artery is patent. There is loss of flow at the distal CCA and proximal ICA that may be related to interval stent placement. Mid to distal cervical ICA is patent. Vertebral arteries are  patent. There is limited evaluation proximally due to artifact. No significant stenosis identified. IMPRESSION: Degraded by motion. No acute infarction, hemorrhage, or mass. No significant change since the prior brain MRI. Loss of flow related enhancement of the distal CCA and proximal ICA is probably related to artifact from interval stent. Greater than 70% stenosis of the proximal right ICA is probably similar to the prior CTA. Right greater than left intracranial ICA atherosclerosis and stenosis is likely similar to the prior CTA. 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 Electronically Signed   By: Geannie Keener M.D.   On: 04/29/2024 12:38   MR ANGIO NECK WO CONTRAST Result Date: 04/29/2024 CLINICAL DATA:  Neuro deficit, acute, stroke suspected EXAM: MRI HEAD WITHOUT CONTRAST MRA HEAD WITHOUT CONTRAST MRA NECK WITHOUT CONTRAST TECHNIQUE: Multiplanar, multiecho pulse sequences of the brain and surrounding structures were obtained without intravenous contrast. Angiographic images of the Circle of Willis were obtained using MRA technique without intravenous contrast. Angiographic images of the neck were obtained using MRA technique without intravenous contrast. Carotid stenosis measurements (when applicable) are obtained utilizing NASCET criteria, using the distal internal carotid diameter as the denominator. COMPARISON:  CTA 04/23/2024, MRI head 12/07/2023 FINDINGS: MRI HEAD Motion artifact. Brain: There is no acute infarction or intracranial hemorrhage. There is no intracranial mass, mass effect, or edema. There is no hydrocephalus or extra-axial fluid collection. Ventricles and sulci are stable in size and configuration. Patchy foci of T2 hyperintensity in the supratentorial white matter are nonspecific but may reflect stable mild chronic microvascular ischemic changes. Vascular: Major vessel flow voids at the skull base are preserved. Skull and upper cervical spine: Normal marrow signal  is preserved. Sinuses/Orbits: Postoperative changes with minimal patchy mucosal thickening. Right lens replacement. Other: Evidence of prior sellar mass resection minimal patchy mastoid fluid opacification. Retention cysts along the posterior wall of the nasopharynx. MRA HEAD Motion artifact. Intracranial internal carotid arteries are patent. Atherosclerotic irregularity with stenosis. There is up to marked stenosis of the right cavernous ICA and moderate marked stenosis of the left paraclinoid ICA, likely similar to prior CTA. Middle and anterior cerebral arteries are patent. 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 Intracranial vertebral arteries, basilar artery, posterior cerebral arteries are patent. Bilateral posterior communicating arteries are present. No significant stenosis. MRA NECK Motion artifact. Right common, internal, and external carotid arteries are patent. There is plaque without hemodynamically significant stenosis at the ICA origin. Subsequent circumferential narrowing of the proximal ICA causing greater than 70% stenosis. Likely no significant change from the prior CTA. Left common carotid artery is patent. There is loss of flow at the distal CCA and proximal ICA that may be related to interval stent placement. Mid to distal cervical ICA is patent. Vertebral arteries are patent. There is limited evaluation proximally due to artifact. No significant stenosis identified. IMPRESSION: Degraded by motion. No acute infarction, hemorrhage, or mass. No significant change since the prior brain MRI. Loss of flow related enhancement of the distal CCA and proximal ICA is probably related to artifact from interval stent. Greater than 70% stenosis of the proximal right ICA is probably similar to the prior CTA. Right greater than left intracranial ICA atherosclerosis and stenosis is likely similar to the prior CTA. Likely no significant change  in left MCA bifurcation aneurysm. Electronically Signed    By: Geannie Keener M.D.   On: 04/29/2024 12:38   CT HEAD CODE STROKE WO CONTRAST Result Date: 04/29/2024 CLINICAL DATA:  Code stroke.  Left-sided weakness EXAM: CT HEAD WITHOUT CONTRAST TECHNIQUE: Contiguous axial images were obtained from the base of the skull through the vertex without intravenous contrast. RADIATION DOSE REDUCTION: This exam was performed according to the departmental dose-optimization program which includes automated exposure control, adjustment of the mA and/or kV according to patient size and/or use of iterative reconstruction technique. COMPARISON:  Six days prior FINDINGS: Brain: No evidence of acute infarction, hemorrhage, hydrocephalus, extra-axial collection or mass lesion/mass effect. Extensive dural calcification Vascular: No hyperdense vessel. Scattered areas of calcification within the superior sagittal and left transverse sigmoid dural sinuses. Skull: Normal. Negative for fracture or focal lesion. Sinuses/Orbits: Chronic sinusitis with prior endoscopic sinus surgery Other: Prelim sent in epic chat ASPECTS [facility] Stroke Program Early CT Score) - Ganglionic level infarction (caudate, lentiform nuclei, internal capsule, insula, M1-M3 cortex): 7 - Supraganglionic infarction (M4-M6 cortex): 3 Total score (0-10 with 10 being normal): 10 IMPRESSION: No acute or interval finding. Electronically Signed   By: Ronnette Coke M.D.   On: 04/29/2024 11:08   ECHOCARDIOGRAM COMPLETE Result Date: 04/25/2024    ECHOCARDIOGRAM REPORT   Patient Name:   Terry Arroyo Date of Exam: 04/25/2024 Medical Rec #:  [id number]       Height:       66.0 in Accession #:    [id number]      Weight:       162.0 lb Date of Birth:  [birth year] 1961        BSA:          1.829 m Patient Age:    62 years        BP:           154/61 mmHg Patient Gender: F               HR:           63 bpm. Exam Location:  Inpatient Procedure: 2D Echo, Color Doppler and Cardiac Doppler (Both Spectral and Color            Flow Doppler were  utilized during procedure). Indications:    Stroke  History:        Patient has prior history of Echocardiogram examinations, most                 recent 12/08/2023.  Sonographer:    Andrena Bang Referring Phys: Leandro Proffer [place] IMPRESSIONS  1. Left ventricular ejection fraction, by estimation, is 35 to 40%. The left ventricle has moderately decreased function. The left ventricle demonstrates regional wall motion abnormalities (see scoring diagram/findings for description). Left ventricular  diastolic parameters are consistent with Grade II diastolic dysfunction (pseudonormalization).  2. Right ventricular systolic function is mildly reduced. The right ventricular size is moderately enlarged. There is moderately elevated pulmonary artery systolic pressure. The estimated right ventricular systolic pressure is 50.0 mmHg.  3. Left atrial size was moderately dilated.  4. Right atrial size was moderately dilated.  5. The mitral valve is normal in structure. Mild to moderate mitral valve regurgitation. No evidence of mitral stenosis. Moderate mitral annular calcification.  6. Tricuspid valve regurgitation is moderate.  7. The aortic valve is calcified. There is mild calcification of the aortic valve. There is mild thickening of the aortic valve. Aortic valve regurgitation is  not visualized. No aortic stenosis is present. Aortic valve mean gradient measures 6.0 mmHg. Aortic valve Vmax measures 1.81 m/s.  8. The inferior vena cava is dilated in size with >50% respiratory variability, suggesting right atrial pressure of 8 mmHg. Conclusion(s)/Recommendation(s): No intracardiac source of embolism detected on this transthoracic study. Consider a transesophageal echocardiogram to exclude cardiac source of embolism if clinically indicated. FINDINGS  Left Ventricle: Left ventricular ejection fraction, by estimation, is 35 to 40%. The left ventricle has moderately decreased function. The left ventricle demonstrates regional wall  motion abnormalities. The left ventricular internal cavity size was normal in size. There is no left ventricular hypertrophy. Left ventricular diastolic parameters are consistent with Grade II diastolic dysfunction (pseudonormalization).  LV Wall Scoring: The inferior wall and posterior wall are akinetic. Right Ventricle: The right ventricular size is moderately enlarged. No increase in right ventricular wall thickness. Right ventricular systolic function is mildly reduced. There is moderately elevated pulmonary artery systolic pressure. The tricuspid regurgitant velocity is 3.24 m/s, and with an assumed right atrial pressure of 8 mmHg, the estimated right ventricular systolic pressure is 50.0 mmHg. Left Atrium: Left atrial size was moderately dilated. Right Atrium: Right atrial size was moderately dilated. Pericardium: There is no evidence of pericardial effusion. Mitral Valve: The mitral valve is normal in structure. Moderate mitral annular calcification. Mild to moderate mitral valve regurgitation. No evidence of mitral valve stenosis. MV peak gradient, 9.0 mmHg. The mean mitral valve gradient is 3.0 mmHg. Tricuspid Valve: The tricuspid valve is normal in structure. Tricuspid valve regurgitation is moderate . No evidence of tricuspid stenosis. Aortic Valve: The aortic valve is calcified. There is mild calcification of the aortic valve. There is mild thickening of the aortic valve. Aortic valve regurgitation is not visualized. No aortic stenosis is present. Aortic valve mean gradient measures 6.0 mmHg. Aortic valve peak gradient measures 13.1 mmHg. Aortic valve area, by VTI measures 1.21 cm. Pulmonic Valve: The pulmonic valve was normal in structure. Pulmonic valve regurgitation is not visualized. No evidence of pulmonic stenosis. Aorta: The aortic root is normal in size and structure. Venous: The inferior vena cava is dilated in size with greater than 50% respiratory variability, suggesting right atrial pressure  of 8 mmHg. IAS/Shunts: No atrial level shunt detected by color flow Doppler.  LEFT VENTRICLE PLAX 2D LVIDd:         6.00 cm      Diastology LVIDs:         5.10 cm      LV e' medial:    4.24 cm/s LV PW:         0.85 cm      LV E/e' medial:  31.6 LV IVS:        1.15 cm      LV e' lateral:   7.18 cm/s LVOT diam:     1.70 cm      LV E/e' lateral: 18.7 LV SV:         48 LV SV Index:   26 LVOT Area:     2.27 cm  LV Volumes (MOD) LV vol d, MOD A2C: 152.0 ml LV vol d, MOD A4C: 143.0 ml LV vol s, MOD A2C: 82.1 ml LV vol s, MOD A4C: 92.2 ml LV SV MOD A2C:     69.9 ml LV SV MOD A4C:     143.0 ml LV SV MOD BP:      62.8 ml RIGHT VENTRICLE RV S prime:     6.31 cm/s  TAPSE (M-mode): 1.1 cm LEFT ATRIUM           Index LA diam:      4.00 cm 2.19 cm/m LA Vol (A2C): 78.2 ml 42.76 ml/m LA Vol (A4C): 44.5 ml 24.33 ml/m  AORTIC VALVE AV Area (Vmax):    1.14 cm AV Area (Vmean):   1.13 cm AV Area (VTI):     1.21 cm AV Vmax:           181.00 cm/s AV Vmean:          111.000 cm/s AV VTI:            0.401 m AV Peak Grad:      13.1 mmHg AV Mean Grad:      6.0 mmHg LVOT Vmax:         90.80 cm/s LVOT Vmean:        55.200 cm/s LVOT VTI:          0.213 m LVOT/AV VTI ratio: 0.53  AORTA Ao Asc diam: 3.10 cm MITRAL VALVE                TRICUSPID VALVE MV Area (PHT): 5.27 cm     TV Peak grad:   42.0 mmHg MV Area VTI:   1.18 cm     TV Vmax:        3.24 m/s MV Peak grad:  9.0 mmHg     TR Peak grad:   42.0 mmHg MV Mean grad:  3.0 mmHg     TR Vmax:        324.00 cm/s MV Vmax:       1.50 m/s MV Vmean:      75.5 cm/s    SHUNTS MV Decel Time: 144 msec     Systemic VTI:  0.21 m MV E velocity: 134.00 cm/s  Systemic Diam: 1.70 cm MV A velocity: 89.40 cm/s MV E/A ratio:  1.50 Dorothye Gathers MD Electronically signed by Dorothye Gathers MD Signature Date/Time: 04/25/2024/3:19:00 PM    Final    EEG adult Result Date: 04/25/2024 Arleene Lack, MD     04/25/2024 10:18 AM Patient Name: Terry Arroyo MRN: [id number] Epilepsy Attending: Arleene Lack Referring  Physician/Provider: Colon Dear, NP Date: 04/25/2024 Duration: 25.36 mins Patient history: 63 year old female with acute ischemic stroke getting EEG to evaluate for seizure. Level of alertness: Awake AEDs during EEG study: GBP, TPM Technical aspects: This EEG study was done with scalp electrodes positioned according to the 10-20 International system of electrode placement. Electrical activity was reviewed with band pass filter of 1-70Hz , sensitivity of 7 uV/mm, display speed of 19mm/sec with a 60Hz  notched filter applied as appropriate. EEG data were recorded continuously and digitally stored.  Video monitoring was available and reviewed as appropriate. Description: The posterior dominant rhythm consists of 8 Hz activity of moderate voltage (25-35 uV) seen predominantly in posterior head regions, symmetric and reactive to eye opening and eye closing. Hyperventilation and photic stimulation were not performed.   IMPRESSION: This study is within normal limits. No seizures or epileptiform discharges were seen throughout the recording. A normal interictal EEG does not exclude the diagnosis of epilepsy. Arleene Lack   MR BRAIN WO CONTRAST Result Date: 04/24/2024 CLINICAL DATA:  Stroke follow-up EXAM: MRI HEAD WITHOUT CONTRAST TECHNIQUE: Multiplanar, multiecho pulse sequences of the brain and surrounding structures were obtained without intravenous contrast. COMPARISON:  12/07/2023 FINDINGS: Brain: No acute infarction, hemorrhage, hydrocephalus, extra-axial collection or mass lesion. Chronic  generalized dural thickening with calcification by CT. Vascular: Diffusely attenuated superior sagittal sinus with absent flow void continuing into the left sigmoid sinus, chronic partial thrombosis by prior enhanced imaging with prominent venous collaterals along the right inferior convexity. Chronic thrombus associated with calcifications. Skull and upper cervical spine: Diffusely hypointense marrow signal. No focal  lesion seen. Sinuses/Orbits: Prior endoscopic sinus surgery. Right proptosis not associated with acute inflammation, stable from 2024. Nasopharyngeal retention cysts. IMPRESSION: No acute finding.  Negative for acute infarct. Chronic dural venous sinus thrombosis with collaterals. Electronically Signed   By: Ronnette Coke M.D.   On: 04/24/2024 06:03   IR PERCUTANEOUS ART THROMBECTOMY/INFUSION INTRACRANIAL INC DIAG ANGIO Result Date: 04/23/2024 PROCEDURE PERFORMED: 1. Stroke thrombectomy 2. Ultrasound vascular access 3. Cone beam CT for treatment planning COMPARISON:  CT perfusion performed Apr 23, 2024. CLINICAL DATA:  63 year old female with acute ischemic stroke with symptoms of right motor dysfunction sensory deficits and dysarthria with an NIH stroke scale measuring 14. CT imaging demonstrated a severe proximal left internal carotid artery stenosis and perfusion imaging suggested hypoperfusion to the left hemisphere. After discussing the case in a multidisciplinary fashion, and confirming that there was no immediate available decision maker, the patient was transferred for thrombectomy and emergent 2 physician consent was documented. INDICATION: Acute ischemic ischemic stroke with left hemi severe hypoperfusion. ANESTHESIA/SEDATION: General anesthesia was utilized for the procedure. CONTRAST:  Approximately 75 cc Omnipaque  300 320 MEDICATIONS: Heparin  6000 units intravenously Brilinta  180 mg per tube FLUOROSCOPY TIME:  Fluoroscopy Time: 15.8 minutes (737 mGy). COMPLICATIONS: None immediate. BODY OF REPORT: The patient was then put under general anesthesia by the Department of Anesthesiology at [facility]. Insert ultrasound access The right groin was prepped and draped in the usual sterile fashion. Ultrasound was used to study the right common femoral artery which was patent. Using real-time ultrasound guidance, a 19 gauge introducer needle was used to access the right common femoral artery. Access  was performed at 1357. A hard copy image ultrasound the same date uncertain PACS. Using this access, a 6 French sheath was placed in the descending thoracic aorta. Approximately 5000 units of heparin  was administered intravenously. Next, selective catheterization the left common carotid artery was performed. A selective arteriogram was performed which demonstrated that the internal carotid artery was patent. But demonstrated a severe focal stenosis estimated at greater than 80% in the single frontal plane of imaging. This was use a lies as it best profiled the bifurcation between the internal carotid artery and external carotid artery. There was no evidence of intracranial filling defect. Scattered mild diffuse atherosclerotic changes are present. Next, the lesion was crossed with a catheter and guidewire. At this point, my tempted to deployed embolic protection device, however, I could not safely deployed the device in the proximal internal carotid artery secondary to tortuosity and concern for device detachment. Therefore, the proximal stenosis was treated with balloon angioplasty using a 4 mm noncompliant balloon. This was considered the first pass for the purposes of documentation and this was performed at 14:20. Next, a precise 6 mm x 40 mm self expanding stent was deployed in the proximal left internal carotid artery extending into the common carotid artery. Deployment was completed at 14:50 and this was considered recanalization. There was a residual waist and therefore stent dilation was performed using a 5 mm noncompliant balloon. A post treatment arteriogram demonstrated no evidence of significant residual proximal stenosis. There was good apposition of the bare metal stent. There was persistent  filling of the left external carotid artery. The intracranial arteries were improved in terms of caliber. There was no evidence of filling defect to suggest peripheral embolus. I elected to terminate the procedure  at this point. The revascularization score was graded at TICI-3 Evaluation of the right femoral access site demonstrated at this site was suitable for closure device. A 6 French Angio-Seal device was deployed without complication. Become beam CT was then performed to evaluate for intracranial hemorrhage and treatment planning. This demonstrated no evidence significant acute intracranial hemorrhage. Fluoroscopic imaging confirmed placement of an enteric tube within the stomach. At this point, 180 mg of Brilinta  was administered per tube. The patient was then transferred to the ICU in stable condition while remaining intubated. IMPRESSION: 1. Emergent left internal carotid artery stent placement due to clinical concern for is global left-sided hypoperfusion and developing stroke in the setting of severe left internal carotid artery stenosis. PLAN: 1. To ICU for routine postoperative supportive care. 2. The patient will continue on a heparin  infusion intravenously at 500 units/hour for the next 6 hours. At that point, heparin  will be discontinued. 3. The patient has received a bolus dose of aspirin  PR and a bolus dose of 180 mg of Brilinta  per tube. Electronically Signed   By: Reagan Camera M.D.   On: 04/23/2024 16:31   IR US  Guide Vasc Access Right Result Date: 04/23/2024 PROCEDURE PERFORMED: 1. Stroke thrombectomy 2. Ultrasound vascular access 3. Cone beam CT for treatment planning COMPARISON:  CT perfusion performed Apr 23, 2024. CLINICAL DATA:  63 year old female with acute ischemic stroke with symptoms of right motor dysfunction sensory deficits and dysarthria with an NIH stroke scale measuring 14. CT imaging demonstrated a severe proximal left internal carotid artery stenosis and perfusion imaging suggested hypoperfusion to the left hemisphere. After discussing the case in a multidisciplinary fashion, and confirming that there was no immediate available decision maker, the patient was transferred for thrombectomy  and emergent 2 physician consent was documented. INDICATION: Acute ischemic ischemic stroke with left hemi severe hypoperfusion. ANESTHESIA/SEDATION: General anesthesia was utilized for the procedure. CONTRAST:  Approximately 75 cc Omnipaque  300 320 MEDICATIONS: Heparin  6000 units intravenously Brilinta  180 mg per tube FLUOROSCOPY TIME:  Fluoroscopy Time: 15.8 minutes (737 mGy). COMPLICATIONS: None immediate. BODY OF REPORT: The patient was then put under general anesthesia by the Department of Anesthesiology at [facility]. Insert ultrasound access The right groin was prepped and draped in the usual sterile fashion. Ultrasound was used to study the right common femoral artery which was patent. Using real-time ultrasound guidance, a 19 gauge introducer needle was used to access the right common femoral artery. Access was performed at 1357. A hard copy image ultrasound the same date uncertain PACS. Using this access, a 6 French sheath was placed in the descending thoracic aorta. Approximately 5000 units of heparin  was administered intravenously. Next, selective catheterization the left common carotid artery was performed. A selective arteriogram was performed which demonstrated that the internal carotid artery was patent. But demonstrated a severe focal stenosis estimated at greater than 80% in the single frontal plane of imaging. This was use a lies as it best profiled the bifurcation between the internal carotid artery and external carotid artery. There was no evidence of intracranial filling defect. Scattered mild diffuse atherosclerotic changes are present. Next, the lesion was crossed with a catheter and guidewire. At this point, my tempted to deployed embolic protection device, however, I could not safely deployed the device in the proximal  internal carotid artery secondary to tortuosity and concern for device detachment. Therefore, the proximal stenosis was treated with balloon angioplasty using a 4 mm  noncompliant balloon. This was considered the first pass for the purposes of documentation and this was performed at 14:20. Next, a precise 6 mm x 40 mm self expanding stent was deployed in the proximal left internal carotid artery extending into the common carotid artery. Deployment was completed at 14:50 and this was considered recanalization. There was a residual waist and therefore stent dilation was performed using a 5 mm noncompliant balloon. A post treatment arteriogram demonstrated no evidence of significant residual proximal stenosis. There was good apposition of the bare metal stent. There was persistent filling of the left external carotid artery. The intracranial arteries were improved in terms of caliber. There was no evidence of filling defect to suggest peripheral embolus. I elected to terminate the procedure at this point. The revascularization score was graded at TICI-3 Evaluation of the right femoral access site demonstrated at this site was suitable for closure device. A 6 French Angio-Seal device was deployed without complication. Become beam CT was then performed to evaluate for intracranial hemorrhage and treatment planning. This demonstrated no evidence significant acute intracranial hemorrhage. Fluoroscopic imaging confirmed placement of an enteric tube within the stomach. At this point, 180 mg of Brilinta  was administered per tube. The patient was then transferred to the ICU in stable condition while remaining intubated. IMPRESSION: 1. Emergent left internal carotid artery stent placement due to clinical concern for is global left-sided hypoperfusion and developing stroke in the setting of severe left internal carotid artery stenosis. PLAN: 1. To ICU for routine postoperative supportive care. 2. The patient will continue on a heparin  infusion intravenously at 500 units/hour for the next 6 hours. At that point, heparin  will be discontinued. 3. The patient has received a bolus dose of aspirin   PR and a bolus dose of 180 mg of Brilinta  per tube. Electronically Signed   By: Reagan Camera M.D.   On: 04/23/2024 16:31   IR CT Head Ltd Result Date: 04/23/2024 PROCEDURE PERFORMED: 1. Stroke thrombectomy 2. Ultrasound vascular access 3. Cone beam CT for treatment planning COMPARISON:  CT perfusion performed Apr 23, 2024. CLINICAL DATA:  63 year old female with acute ischemic stroke with symptoms of right motor dysfunction sensory deficits and dysarthria with an NIH stroke scale measuring 14. CT imaging demonstrated a severe proximal left internal carotid artery stenosis and perfusion imaging suggested hypoperfusion to the left hemisphere. After discussing the case in a multidisciplinary fashion, and confirming that there was no immediate available decision maker, the patient was transferred for thrombectomy and emergent 2 physician consent was documented. INDICATION: Acute ischemic ischemic stroke with left hemi severe hypoperfusion. ANESTHESIA/SEDATION: General anesthesia was utilized for the procedure. CONTRAST:  Approximately 75 cc Omnipaque  300 320 MEDICATIONS: Heparin  6000 units intravenously Brilinta  180 mg per tube FLUOROSCOPY TIME:  Fluoroscopy Time: 15.8 minutes (737 mGy). COMPLICATIONS: None immediate. BODY OF REPORT: The patient was then put under general anesthesia by the Department of Anesthesiology at [facility]. Insert ultrasound access The right groin was prepped and draped in the usual sterile fashion. Ultrasound was used to study the right common femoral artery which was patent. Using real-time ultrasound guidance, a 19 gauge introducer needle was used to access the right common femoral artery. Access was performed at 1357. A hard copy image ultrasound the same date uncertain PACS. Using this access, a 6 French sheath was placed in the descending  thoracic aorta. Approximately 5000 units of heparin  was administered intravenously. Next, selective catheterization the left common carotid  artery was performed. A selective arteriogram was performed which demonstrated that the internal carotid artery was patent. But demonstrated a severe focal stenosis estimated at greater than 80% in the single frontal plane of imaging. This was use a lies as it best profiled the bifurcation between the internal carotid artery and external carotid artery. There was no evidence of intracranial filling defect. Scattered mild diffuse atherosclerotic changes are present. Next, the lesion was crossed with a catheter and guidewire. At this point, my tempted to deployed embolic protection device, however, I could not safely deployed the device in the proximal internal carotid artery secondary to tortuosity and concern for device detachment. Therefore, the proximal stenosis was treated with balloon angioplasty using a 4 mm noncompliant balloon. This was considered the first pass for the purposes of documentation and this was performed at 14:20. Next, a precise 6 mm x 40 mm self expanding stent was deployed in the proximal left internal carotid artery extending into the common carotid artery. Deployment was completed at 14:50 and this was considered recanalization. There was a residual waist and therefore stent dilation was performed using a 5 mm noncompliant balloon. A post treatment arteriogram demonstrated no evidence of significant residual proximal stenosis. There was good apposition of the bare metal stent. There was persistent filling of the left external carotid artery. The intracranial arteries were improved in terms of caliber. There was no evidence of filling defect to suggest peripheral embolus. I elected to terminate the procedure at this point. The revascularization score was graded at TICI-3 Evaluation of the right femoral access site demonstrated at this site was suitable for closure device. A 6 French Angio-Seal device was deployed without complication. Become beam CT was then performed to evaluate for  intracranial hemorrhage and treatment planning. This demonstrated no evidence significant acute intracranial hemorrhage. Fluoroscopic imaging confirmed placement of an enteric tube within the stomach. At this point, 180 mg of Brilinta  was administered per tube. The patient was then transferred to the ICU in stable condition while remaining intubated. IMPRESSION: 1. Emergent left internal carotid artery stent placement due to clinical concern for is global left-sided hypoperfusion and developing stroke in the setting of severe left internal carotid artery stenosis. PLAN: 1. To ICU for routine postoperative supportive care. 2. The patient will continue on a heparin  infusion intravenously at 500 units/hour for the next 6 hours. At that point, heparin  will be discontinued. 3. The patient has received a bolus dose of aspirin  PR and a bolus dose of 180 mg of Brilinta  per tube. Electronically Signed   By: Reagan Camera M.D.   On: 04/23/2024 16:31   CT CEREBRAL PERFUSION W CONTRAST Result Date: 04/23/2024 CLINICAL DATA:  63 year old female code stroke presentation, profound unilateral lower extremity weakness. EXAM: CT PERFUSION BRAIN TECHNIQUE: Multiphase CT imaging of the brain was performed following IV bolus contrast injection. Subsequent parametric perfusion maps were calculated using RAPID software. RADIATION DOSE REDUCTION: This exam was performed according to the departmental dose-optimization program which includes automated exposure control, adjustment of the mA and/or kV according to patient size and/or use of iterative reconstruction technique. CONTRAST:  50mL OMNIPAQUE  IOHEXOL  350 MG/ML SOLN COMPARISON:  CTA 1207 hours today. FINDINGS: FINDINGS ASPECTS: 10 CBF (<30%) Volume: 0mL. No CBF ir CBV parameter abnormality detected. Perfusion (Tmax>6.0s) volume: 0mL however, 88 mL of T-max greater than 4s is detected which is mostly (although not entirely)  in the left hemisphere. Mismatch Volume: Not applicablemL  Infarction Location:Not applicable IMPRESSION: 1. NO infarct core or penumbra detected by standard CTP parameters. 2. Suggestion of asymmetric oligemia in the left hemisphere (T-max > 4s), which might relate to the severe ICA stenoses described on CTA today. Electronically Signed   By: Marlise Simpers M.D.   On: 04/23/2024 13:04   CT Angio Chest/Abd/Pel for Dissection W and/or W/WO Result Date: 04/23/2024 CLINICAL DATA:  Weakness and back pain. Acute aortic syndrome suspected. EXAM: CT ANGIOGRAPHY CHEST, ABDOMEN AND PELVIS TECHNIQUE: Non-contrast CT of the chest was initially obtained. Multidetector CT imaging through the chest, abdomen and pelvis was performed using the standard protocol during bolus administration of intravenous contrast. Multiplanar reconstructed images and MIPs were obtained and reviewed to evaluate the vascular anatomy. RADIATION DOSE REDUCTION: This exam was performed according to the departmental dose-optimization program which includes automated exposure control, adjustment of the mA and/or kV according to patient size and/or use of iterative reconstruction technique. CONTRAST:  OMNIPAQUE  IOHEXOL  350 MG/ML SOLN COMPARISON:  CT abdomen pelvis dated 02/09/2022. FINDINGS: Evaluation is limited due to streak artifact caused by patient's arms and overlying support wires. CTA CHEST FINDINGS Cardiovascular: Mild cardiomegaly. No pericardial effusion. There is mild atherosclerotic calcification of the thoracic aorta. No aneurysmal dilatation or dissection. The origins of the great vessels of the aortic arch appear patent. No pulmonary artery embolus identified. Mediastinum/Nodes: No hilar or mediastinal adenopathy. There is circumferential thickening of the mid esophagus concerning for esophagitis. Clinical correlation is recommended. An infiltrative process is not excluded. No mediastinal fluid collection. Lungs/Pleura: There are minimal bibasilar linear atelectasis. No focal consolidation, pleural  effusion, or pneumothorax. The central airways are patent. Musculoskeletal: Median sternotomy wires. No acute osseous pathology. Review of the MIP images confirms the above findings. CTA ABDOMEN AND PELVIS FINDINGS VASCULAR Aorta: Advanced atherosclerotic calcification of the abdominal aorta. No aneurysmal dilatation or dissection. No periaortic fluid collection. Celiac: Atherosclerotic calcification of the origin of the celiac artery. The celiac artery and its major branches are patent. SMA: The SMA is patent. Renals: There is atherosclerotic calcification of the origins of the renal arteries with luminal narrowing. There is diminished flow in the renal arteries bilaterally. The renal arteries however remain patent. IMA: Atherosclerotic calcification of the IMA with diminished flow. Focal area of high-grade narrowing of the proximal IMA with reconstitution of flow distal to this segment. Inflow: Advanced atherosclerotic calcification of the iliac arteries. The iliac arteries are patent. Veins: The IVC is unremarkable.  No portal venous gas. Review of the MIP images confirms the above findings. NON-VASCULAR No intra-abdominal free air.  Small perihepatic ascites. Hepatobiliary: The liver is unremarkable. No biliary dilatation. The gallbladder is unremarkable. Pancreas: Unremarkable. No pancreatic ductal dilatation or surrounding inflammatory changes. Spleen: Normal in size without focal abnormality. Adrenals/Urinary Tract: The adrenal glands are unremarkable. Moderate bilateral renal parenchyma atrophy. No hydronephrosis. There is stranding of the perinephric and renal sinus fat. Correlation with urinalysis recommended to exclude UTI. The urinary bladder is collapsed. Stomach/Bowel: Mild sigmoid diverticulosis. There is no bowel obstruction or active inflammation. The appendix is normal. Lymphatic: No adenopathy. Reproductive: The uterus is grossly unremarkable. No suspicious adnexal masses. Other: None  Musculoskeletal: No acute or significant osseous findings. Review of the MIP images confirms the above findings. IMPRESSION: 1. No aortic aneurysm or dissection. 2. Circumferential thickening of the mid esophagus concerning for esophagitis. Clinical correlation is recommended. 3. Mild sigmoid diverticulosis. No bowel obstruction. Normal appendix. 4. Moderate bilateral  renal parenchyma atrophy. No hydronephrosis. Correlation with urinalysis recommended to exclude UTI. Electronically Signed   By: Angus Bark M.D.   On: 04/23/2024 12:44   CT ANGIO HEAD NECK W WO CM (CODE STROKE) Result Date: 04/23/2024 CLINICAL DATA:  63 year old female code stroke presentation. Possible left MCA syndrome. Dialysis patient. EXAM: CT ANGIOGRAPHY HEAD AND NECK WITH AND WITHOUT CONTRAST TECHNIQUE: Multidetector CT imaging of the head and neck was performed using the standard protocol during bolus administration of intravenous contrast. Multiplanar CT image reconstructions and MIPs were obtained to evaluate the vascular anatomy. Carotid stenosis measurements (when applicable) are obtained utilizing NASCET criteria, using the distal internal carotid diameter as the denominator. RADIATION DOSE REDUCTION: This exam was performed according to the departmental dose-optimization program which includes automated exposure control, adjustment of the mA and/or kV according to patient size and/or use of iterative reconstruction technique. CONTRAST:  50mL OMNIPAQUE  IOHEXOL  350 MG/ML SOLN COMPARISON:  Head CT 1158 hours today. FINDINGS: CTA NECK Skeleton: Reversal of the normal cervical lordosis. Previous sternotomy. Mostly absent dentition. Upper thoracic spina bifida occulta. No acute osseous abnormality identified. Upper chest: Previous CABG. Other neck: Partially retropharyngeal carotids. Nonvascular neck soft tissue spaces are within normal limits. Aortic arch: Calcified aortic atherosclerosis. Bovine arch configuration. Right carotid  system: Brachiocephalic artery and proximal right CCA are patent with mild plaque and no stenosis. Tortuous, partially retropharyngeal right CCA. Calcified plaque at the right ICA origin without significant stenosis. Soft and calcified plaque at the bulb or just distal to the bulb with high-grade stenosis (series 2, image 94 and series 6, image 68) approaching a radiographic string sign, numerically estimated at 70-80 % with respect to the distal vessel. The vessel remains patent to the skull base. Left carotid system: Left CCA origin plaque with less than 50% stenosis. Tortuous left CCA. Soft and calcified plaque before the bifurcation. Severe plaque at the left carotid bifurcation with radiographic string sign stenosis series 5, image 133. But the vessel remains patent. Vertebral arteries: Right subclavian origin calcified plaque without stenosis. Diminutive and atherosclerotic right vertebral artery origin and V1 segment with at least moderate stenosis. Intermittent right V2 segment plaque, but the right vertebral artery caliber improves to the skull base with no additional significant stenosis. Left subclavian origin plaque without stenosis. Calcified plaque at the left vertebral artery origin with similar mild to moderate stenosis. Codominant left vertebral artery with intermittent V2 and V3 segment plaque but no other significant stenosis to the skull base. CTA HEAD Posterior circulation: Atherosclerotic distal vertebral arteries and vertebrobasilar junction. Dominant left V4. PICA origins remain patent. Mild distal vertebral and vertebrobasilar stenosis. Patent basilar artery without stenosis. Patent SCA and PCA origins. Small posterior communicating arteries. Bilateral PCA branches are within normal limits. Anterior circulation:  Both ICA siphons are patent. Severe calcified atherosclerosis and stenosis of the cavernous left ICA on series 6, image 102, string sign stenosis. Moderate additional left  supraclinoid plaque. The vessel remains patent. Left posterior communicating artery origin is normal. Similar severe calcified right siphon plaque and stenosis. Distal right ICA remains patent with moderate additional supraclinoid plaque and stenosis. Patent carotid termini, MCA and ACA origins. Normal anterior communicating artery. Bilateral ACA branches are within normal limits. Right MCA M1 segment and bifurcation are patent without stenosis. No right MCA branch occlusion is identified. Left MCA M1 segment is patent without stenosis. There is a saccular 2-3 mm left MCA bifurcation aneurysm on series 6, image 112, directed anteriorly and laterally. Left MCA bifurcation  remains patent. No left MCA branch occlusion is identified. Venous sinuses: Not evaluated due to early contrast timing. Anatomic variants: Dominant left vertebral artery. Review of the MIP images confirms the above findings IMPRESSION: 1. Negative for large vessel occlusion, But Positive for VERY SEVERE underlying atherosclerosis with: - LICA origin and bilateral ICA siphon RADIOGRAPHIC STRING SIGN STENOSES. - Right ICA distal bulb high-grade stenosis, estimated 70-80% and approaching a radiographic string sign. 2. 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 3. Less pronounced posterior circulation, bilateral vertebral artery atherosclerosis but still evidence of up to moderate bilateral vertebral artery stenoses. 4.  Aortic Atherosclerosis (ICD10-I70.0). Salient findings discussed by telephone with Dr. Doretta Gant on 04/23/2024 at 12:25 . Electronically Signed   By: Marlise Simpers M.D.   On: 04/23/2024 12:27   CT HEAD CODE STROKE WO CONTRAST Result Date: 04/23/2024 CLINICAL DATA:  Code stroke. 63 year old female. Dialysis patient. Pain. EXAM: CT HEAD WITHOUT CONTRAST TECHNIQUE: Contiguous axial images were obtained from the base of the skull through the vertex without intravenous contrast. RADIATION DOSE REDUCTION: This exam was performed  according to the departmental dose-optimization program which includes automated exposure control, adjustment of the mA and/or kV according to patient size and/or use of iterative reconstruction technique. COMPARISON:  Head CT,Brain MRI 01/04/2024. FINDINGS: Brain: Pronounced chronic dural calcification, stable. Stable cerebral volume. No midline shift, ventriculomegaly, mass effect, evidence of mass lesion, intracranial hemorrhage or evidence of cortically based acute infarction. Stable gray-white matter differentiation throughout the brain, relatively normal for age. Vascular: Chronic severe calcification. Skull: Stable visualized osseous structures. Sinuses/Orbits: Severe chronic paranasal sinusitis with mucoperiosteal thickening, chronically eroded posterior septum and sphenoid septum. Stable appearance since December. Tympanic cavities and mastoids remain well aerated. Other: No acute orbit or scalp soft tissue finding. ASPECTS [facility] Stroke Program Early CT Score) Total score (0-10 with 10 being normal): 10 IMPRESSION: 1. Stable noncontrast CT appearance of the brain since December, remarkable for unusually advanced dural calcification. ASPECTS 10. No acute intracranial hemorrhage. 2. Severe chronic paranasal sinus disease. 3. These results were communicated to Dr. Doretta Gant at 12:11 pm on 04/23/2024 by text page via the [facility] messaging system. Electronically Signed   By: Marlise Simpers M.D.   On: 04/23/2024 12:12     Assessment/Plan There are no diagnoses linked to this encounter.   Devon Fogo, MD  05/23/2024 10:53 AM

## 2024-05-24 ENCOUNTER — Ambulatory Visit (INDEPENDENT_AMBULATORY_CARE_PROVIDER_SITE_OTHER): Admitting: Vascular Surgery

## 2024-05-24 ENCOUNTER — Encounter (INDEPENDENT_AMBULATORY_CARE_PROVIDER_SITE_OTHER)

## 2024-05-24 DIAGNOSIS — N186 End stage renal disease: Secondary | ICD-10-CM

## 2024-05-24 DIAGNOSIS — I70213 Atherosclerosis of native arteries of extremities with intermittent claudication, bilateral legs: Secondary | ICD-10-CM

## 2024-05-24 DIAGNOSIS — I2510 Atherosclerotic heart disease of native coronary artery without angina pectoris: Secondary | ICD-10-CM

## 2024-05-24 DIAGNOSIS — I6523 Occlusion and stenosis of bilateral carotid arteries: Secondary | ICD-10-CM

## 2024-05-24 DIAGNOSIS — I10 Essential (primary) hypertension: Secondary | ICD-10-CM

## 2024-05-29 ENCOUNTER — Ambulatory Visit: Attending: Cardiology | Admitting: Cardiology

## 2024-06-09 ENCOUNTER — Emergency Department (HOSPITAL_COMMUNITY)

## 2024-06-09 ENCOUNTER — Emergency Department (HOSPITAL_COMMUNITY)
Admission: EM | Admit: 2024-06-09 | Discharge: 2024-06-09 | Disposition: A | Discharge status: Home / Self Care | Attending: Emergency Medicine | Admitting: Emergency Medicine

## 2024-06-09 DIAGNOSIS — Z7982 Long term (current) use of aspirin: Secondary | ICD-10-CM | POA: Insufficient documentation

## 2024-06-09 DIAGNOSIS — R0602 Shortness of breath: Secondary | ICD-10-CM

## 2024-06-09 DIAGNOSIS — Z87891 Personal history of nicotine dependence: Secondary | ICD-10-CM | POA: Diagnosis not present

## 2024-06-09 DIAGNOSIS — Z794 Long term (current) use of insulin: Secondary | ICD-10-CM | POA: Diagnosis not present

## 2024-06-09 DIAGNOSIS — Z992 Dependence on renal dialysis: Secondary | ICD-10-CM | POA: Diagnosis not present

## 2024-06-09 DIAGNOSIS — F439 Reaction to severe stress, unspecified: Secondary | ICD-10-CM

## 2024-06-09 DIAGNOSIS — J810 Acute pulmonary edema: Secondary | ICD-10-CM | POA: Diagnosis not present

## 2024-06-09 DIAGNOSIS — Z7951 Long term (current) use of inhaled steroids: Secondary | ICD-10-CM | POA: Insufficient documentation

## 2024-06-09 DIAGNOSIS — R4781 Slurred speech: Secondary | ICD-10-CM | POA: Diagnosis present

## 2024-06-09 DIAGNOSIS — Z7902 Long term (current) use of antithrombotics/antiplatelets: Secondary | ICD-10-CM | POA: Insufficient documentation

## 2024-06-09 LAB — CBC
HCT: 31.5 % — ABNORMAL LOW (ref 36.0–46.0)
Hemoglobin: 9.7 g/dL — ABNORMAL LOW (ref 12.0–15.0)
MCH: 29.3 pg (ref 26.0–34.0)
MCHC: 30.8 g/dL (ref 30.0–36.0)
MCV: 95.2 fL (ref 80.0–100.0)
Platelets: 176 10*3/uL (ref 150–400)
RBC: 3.31 MIL/uL — ABNORMAL LOW (ref 3.87–5.11)
RDW: 14.5 % (ref 11.5–15.5)
WBC: 7 10*3/uL (ref 4.0–10.5)
nRBC: 0 % (ref 0.0–0.2)

## 2024-06-09 LAB — COMPREHENSIVE METABOLIC PANEL WITH GFR
ALT: 12 U/L (ref 0–44)
AST: 15 U/L (ref 15–41)
Albumin: 3.6 g/dL (ref 3.5–5.0)
Alkaline Phosphatase: 85 U/L (ref 38–126)
Anion gap: 15 (ref 5–15)
BUN: 22 mg/dL (ref 8–23)
CO2: 26 mmol/L (ref 22–32)
Calcium: 8.5 mg/dL — ABNORMAL LOW (ref 8.9–10.3)
Chloride: 96 mmol/L — ABNORMAL LOW (ref 98–111)
Creatinine, Ser: 5.16 mg/dL — ABNORMAL HIGH (ref 0.44–1.00)
GFR, Estimated: 9 mL/min — ABNORMAL LOW (ref >60)
Glucose, Bld: 154 mg/dL — ABNORMAL HIGH (ref 70–99)
Potassium: 4.5 mmol/L (ref 3.5–5.1)
Sodium: 137 mmol/L (ref 135–145)
Total Bilirubin: 0.9 mg/dL (ref 0.0–1.2)
Total Protein: 7.3 g/dL (ref 6.5–8.1)

## 2024-06-09 LAB — DIFFERENTIAL
Abs Immature Granulocytes: 0.01 10*3/uL (ref 0.00–0.07)
Basophils Absolute: 0 10*3/uL (ref 0.0–0.1)
Basophils Relative: 1 %
Eosinophils Absolute: 0.2 10*3/uL (ref 0.0–0.5)
Eosinophils Relative: 3 %
Immature Granulocytes: 0 %
Lymphocytes Relative: 14 %
Lymphs Abs: 1 10*3/uL (ref 0.7–4.0)
Monocytes Absolute: 0.5 10*3/uL (ref 0.1–1.0)
Monocytes Relative: 7 %
Neutro Abs: 5.3 10*3/uL (ref 1.7–7.7)
Neutrophils Relative %: 75 %

## 2024-06-09 LAB — CBG MONITORING, ED: Glucose-Capillary: 144 mg/dL — ABNORMAL HIGH (ref 70–99)

## 2024-06-09 LAB — PROTIME-INR
INR: 1.2 (ref 0.8–1.2)
Prothrombin Time: 15.7 s — ABNORMAL HIGH (ref 11.4–15.2)

## 2024-06-09 LAB — ETHANOL: Alcohol, Ethyl (B): <15 mg/dL (ref <15)

## 2024-06-09 LAB — BRAIN NATRIURETIC PEPTIDE: B Natriuretic Peptide: 3464.5 pg/mL — ABNORMAL HIGH (ref 0.0–100.0)

## 2024-06-09 LAB — APTT: aPTT: 31 s (ref 24–36)

## 2024-06-09 MED ORDER — FUROSEMIDE 40 MG PO TABS: 40.0000 mg | ORAL_TABLET | Freq: Every day | ORAL | 0 refills | Status: DC

## 2024-06-09 MED ORDER — SODIUM CHLORIDE 0.9% FLUSH
3.0000 mL | Freq: Once | INTRAVENOUS | Status: AC
  Administered 2024-06-09: 3 mL via INTRAVENOUS

## 2024-06-09 MED ORDER — IPRATROPIUM-ALBUTEROL 0.5-2.5 (3) MG/3ML IN SOLN
3.0000 mL | Freq: Once | RESPIRATORY_TRACT | Status: AC
  Administered 2024-06-09: 3 mL via RESPIRATORY_TRACT
  Filled 2024-06-09: qty 3

## 2024-06-09 NOTE — ED Triage Notes (Signed)
 Pt BIB [facility] from home as a code stroke. Unknown LKW. Pt is a dialysis pt, had her dialysis yesterday. Pt initially called EMS for SOB. EMS reported the pt had trouble speaking and recognizing. VSS.

## 2024-06-09 NOTE — Discharge Instructions (Signed)
 Please follow up for your neck scheduled dialysis on Monday.  I started you on 3 days of furosemide  which is a diuretic medicine to help you pee some extra fluid out of your lungs.  You should discuss your fluid issue with your nephrologist or your primary care provider.

## 2024-06-09 NOTE — Code Documentation (Signed)
 Stroke Response Nurse Documentation Code Documentation  Terry Arroyo is a 63 y.o. female arriving to [name]  via [facility] on 06/09/24 with past medical hx of CHF, L MCA, ESRD on HD MWF, anxiety, anemia, asthma, COPD, depression, DVT, HLD, DM. On No antithrombotic. Code stroke was activated by EMS.   Patient from home with an unknown LKW and now complaining of SOB. Pt called EMS for SOB without relief of inhalers. EMS noted L sided weakness and confusion- activating code stroke. LKW unknown- pt states she has had SOB for a few days. When asked about L sided weakness she states that has been since last admission (May 2025).   Stroke team at the bedside on patient arrival. Labs drawn and patient cleared for CT. Patient to CT with team. NIHSS 4, see documentation for details and code stroke times. The following imaging was completed:  CT Head. Patient is not a candidate for IV Thrombolytic due to outside window & symptoms improving. Patient is not a candidate for IR due to no LVO suspected.   Cancelled at 1150 per Neurology  Bedside handoff with ED RN Terry Arroyo.    Terry Arroyo  Stroke Response RN

## 2024-06-09 NOTE — ED Provider Notes (Signed)
 [name] Provider Note   CSN: [id number] Arrival date & time: 06/09/24  1105     Patient presents with: Code Stroke   Terry Arroyo is a 63 y.o. female presenting from home with concerns of potential code stroke with slurred and difficult speech.  Speech appears to have resolved since arrival.  Patient to call EMS out because if she was concerned about shortness of breath.  She says been ongoing for several days.  She does go to dialysis Monday Wednesday and Friday and has not missed any dialysis.  She also makes urine.  She uses inhaler at home and has been taking this with little relief.  She does have a former history of smoking but no longer.  She denies any leg swelling.  Denies chest pain   HPI     Prior to Admission medications   Medication Sig Start Date End Date Taking? Authorizing Provider  furosemide  (LASIX ) 40 MG tablet Take 1 tablet (40 mg total) by mouth daily for 3 days. 06/09/24 06/12/24 Yes [place] Warriner, Donnice PARAS, MD  acetaminophen  (TYLENOL ) 500 MG tablet Take 1,000 mg by mouth every 6 (six) hours as needed for mild pain (pain score 1-3) or moderate pain (pain score 4-6).    [provider]  albuterol  (VENTOLIN  HFA) 108 (90 Base) MCG/ACT inhaler Inhale 2 puffs into the lungs every 6 (six) hours as needed for wheezing or shortness of breath.    [provider]  aspirin  81 MG EC tablet Take 1 tablet (81 mg total) by mouth daily. 02/10/21   Maree Hue, MD  atorvastatin  (LIPITOR ) 80 MG tablet Take 1 tablet (80 mg total) by mouth daily at 6 PM. 11/26/19   Patel, Pranav M, MD  B Complex-C-Folic Acid  (RENA-VITE RX) 1 MG TABS Take 1 tablet by mouth daily. 03/17/23   [provider]  ezetimibe  (ZETIA ) 10 MG tablet Take 1 tablet (10 mg total) by mouth daily. Patient not taking: Reported on 04/29/2024 04/29/24   de Clint Kill, Cortney E, NP  gabapentin  (NEURONTIN ) 300 MG capsule Take 300 mg by mouth at bedtime.     [provider]  lidocaine -prilocaine  (EMLA ) cream Apply 1 Application topically every Monday, Wednesday, and Friday with hemodialysis.    [provider]  methocarbamol  (ROBAXIN ) 500 MG tablet Take 1.5 tablets (750 mg total) by mouth every 6 (six) hours as needed for muscle spasms. 05/03/24   Christobal Guadalajara, MD  mirtazapine  (REMERON ) 30 MG tablet Take 30 mg by mouth at bedtime.    [provider]  NOVOLOG  MIX 70/30 FLEXPEN (70-30) 100 UNIT/ML FlexPen Inject 7-15 Units into the skin 3 (three) times daily with meals.    [provider]  oxyCODONE -acetaminophen  (PERCOCET/ROXICET) 5-325 MG tablet Take 1 tablet by mouth every 6 (six) hours as needed for up to 4 doses for severe pain (pain score 7-10). 05/03/24   Christobal Guadalajara, MD  senna-docusate (SENOKOT-S) 8.6-50 MG tablet Take 1 tablet by mouth at bedtime as needed for mild constipation or moderate constipation. 05/03/24   Christobal Guadalajara, MD  ticagrelor  (BRILINTA ) 90 MG TABS tablet Take 1 tablet (90 mg total) by mouth 2 (two) times daily. Patient not taking: Reported on 04/29/2024 04/28/24   de Clint Kill, Cortney E, NP  topiramate  (TOPAMAX ) 50 MG tablet Take 50 mg by mouth daily. 11/16/23   [provider]  traZODone  (DESYREL ) 50 MG tablet Take 0.5 tablets (25 mg total) by mouth at bedtime. Patient  taking differently: Take 50 mg by mouth at bedtime. 04/16/22   Lenon Marien CROME, MD  vitamin B-12 (CYANOCOBALAMIN ) 1000 MCG tablet Take 1,000 mcg by mouth daily.    [provider]    Allergies: Hydralazine , Kiwi extract, Lisinopril , Shellfish allergy, Betadine [povidone iodine ], Septra [sulfamethoxazole-trimethoprim], Metformin, and Tape    Review of Systems  Updated Vital Signs BP (!) 154/73   Pulse (!) 58   Temp 97.8 F (36.6 C) (Oral)   Resp (!) 22   Wt 77.6 kg   SpO2 100%   BMI (P) 27.61 kg/m   Physical Exam Constitutional:      General: She is not in acute distress. HENT:     Head: Normocephalic  and atraumatic.   Eyes:     Conjunctiva/sclera: Conjunctivae normal.     Pupils: Pupils are equal, round, and reactive to light.    Cardiovascular:     Rate and Rhythm: Normal rate and regular rhythm.  Pulmonary:     Effort: Pulmonary effort is normal. No respiratory distress.  Abdominal:     General: There is no distension.     Tenderness: There is no abdominal tenderness.   Skin:    General: Skin is warm and dry.   Neurological:     General: No focal deficit present.     Mental Status: She is alert and oriented to person, place, and time. Mental status is at baseline.     Sensory: No sensory deficit.     Motor: No weakness.   Psychiatric:        Mood and Affect: Mood normal.        Behavior: Behavior normal.     (all labs ordered are listed, but only abnormal results are displayed) Labs Reviewed  PROTIME-INR - Abnormal; Notable for the following components:      Result Value   Prothrombin Time 15.7 (*)    All other components within normal limits  CBC - Abnormal; Notable for the following components:   RBC 3.31 (*)    Hemoglobin 9.7 (*)    HCT 31.5 (*)    All other components within normal limits  COMPREHENSIVE METABOLIC PANEL WITH GFR - Abnormal; Notable for the following components:   Chloride 96 (*)    Glucose, Bld 154 (*)    Creatinine, Ser 5.16 (*)    Calcium  8.5 (*)    GFR, Estimated 9 (*)    All other components within normal limits  BRAIN NATRIURETIC PEPTIDE - Abnormal; Notable for the following components:   B Natriuretic Peptide 3,464.5 (*)    All other components within normal limits  CBG MONITORING, ED - Abnormal; Notable for the following components:   Glucose-Capillary 144 (*)    All other components within normal limits  APTT  DIFFERENTIAL  ETHANOL  I-STAT CHEM 8, ED    EKG: None  Radiology: DG Chest Portable 1 View Result Date: 06/09/2024 CLINICAL DATA:  Chest pain EXAM: PORTABLE CHEST 1 VIEW COMPARISON:  Radiograph 12/07/2023  FINDINGS: Stable large cardiac silhouette. Multiple vascular clips overlie the LEFT heart. Central venous congestion is similar prior. Small effusions. No pneumothorax. No focal consolidation. IMPRESSION: Cardiomegaly and central venous congestion. No significant change from comparison exam Electronically Signed   By: Jackquline Boxer M.D.   On: 06/09/2024 13:46   CT HEAD CODE STROKE WO CONTRAST Result Date: 06/09/2024 CLINICAL DATA:  Code stroke. Neuro deficit, concern for stroke, aphasia and confusion. EXAM: CT HEAD WITHOUT CONTRAST TECHNIQUE: Contiguous axial images were  obtained from the base of the skull through the vertex without intravenous contrast. RADIATION DOSE REDUCTION: This exam was performed according to the departmental dose-optimization program which includes automated exposure control, adjustment of the mA and/or kV according to patient size and/or use of iterative reconstruction technique. COMPARISON:  CT head 05/01/2024. FINDINGS: Brain: No acute intracranial hemorrhage. No CT evidence of acute infarct. No edema, mass effect, or midline shift. The basilar cisterns are patent. Ventricles: The ventricles are normal. Vascular: No hyperdense vessel. Atherosclerosis at the skull base involving the carotid siphons. Similar appearance of scattered calcification and increased density along the venous sinuses and tentorium. Skull: No acute or aggressive finding. Orbits: Right lens replacement.  Orbits otherwise unremarkable. Sinuses: Mucosal thickening in the left posterior ethmoid air cells. Postsurgical changes of the sphenoid sinuses. Other: Trace fluid in the right mastoid air cells. ASPECTS [facility] Stroke Program Early CT Score) - Ganglionic level infarction (caudate, lentiform nuclei, internal capsule, insula, M1-M3 cortex): 7 - Supraganglionic infarction (M4-M6 cortex): 3 Total score (0-10 with 10 being normal): 10 IMPRESSION: 1. No CT evidence of acute intracranial abnormality. 2. ASPECTS is  10 These results were communicated to Dr. Vanessa at 11:24 am on 06/09/2024 by text page via the [facility] messaging system. Electronically Signed   By: Donnice Mania M.D.   On: 06/09/2024 11:24     Procedures   Medications Ordered in the ED  sodium chloride  flush (NS) 0.9 % injection 3 mL (3 mLs Intravenous Given 06/09/24 1132)  ipratropium-albuterol  (DUONEB) 0.5-2.5 (3) MG/3ML nebulizer solution 3 mL (3 mLs Nebulization Given 06/09/24 1243)    Clinical Course as of 06/09/24 1358  Sat Jun 09, 2024  1230 Neurologist dr vanessa cancels code stroke - does not think symptoms are related to CVA. [MT]    Clinical Course User Index [MT] Searra Carnathan, Donnice PARAS, MD                                 Medical Decision Making Amount and/or Complexity of Data Reviewed Labs: ordered. Radiology: ordered. ECG/medicine tests: ordered.  Risk Prescription drug management.   This patient presents to the ED with concern for shortness of breath. This involves an extensive number of treatment options, and is a complaint that carries with it a high risk of complications and morbidity.  The differential diagnosis includes COPD exacerbation versus pneumonia versus pleural effusion versus pulmonary edema versus other  Co-morbidities that complicate the patient evaluation: History of dialysis, stage renal disease, at high risk of pulmonary edema  Additional history obtained from EMS  External records from outside source obtained and reviewed including most recent hospital discharge summary from 1 month ago, May 2025, where patient was initially admitted for potential stroke but MRI showed no acute stroke subsequently.  At that time she had presentation of slurred speech and left arm weakness, similar to today.  My discussion with the neurologist today, the patient is not requiring further stroke workup at this time.  Her episodes are felt to be likely nonorganic.  I ordered and personally interpreted labs.  The  pertinent results include: Elevated BNP.  Otherwise labs are stable near baseline levels  I ordered imaging studies including x-ray of the chest, CT head I independently visualized and interpreted imaging which showed no acute stroke noted, pulmonary congestion and cardiomegaly noted on x-ray I agree with the radiologist interpretation  The patient was maintained on a cardiac monitor.  I  personally viewed and interpreted the cardiac monitored which showed an underlying rhythm of: Borderline bradycardia   I have reviewed the patients home medicines and have made adjustments as needed  Test Considered: No indication for MRI imaging of the brain, low suspicion for acute stroke per my discussion with neurologist.  No further stroke workup indicated at this time.  Patient does not have any acute stroke or neurological deficits on my examination.  Low suspicion overall for acute PE  I requested consultation with the neurology,  and discussed lab and imaging findings as well as pertinent plan - they recommend: No further stroke workup indicated at this time  Disposition:  Ultimately the patient remained stable on room air, no tachypnea, no hypoxia.  I suspect that she is having some pulmonary edema.  Given that she does make urine, I think 3 days of Lasix  would be reasonable and she can follow-up with her nephrologist and dialysis center on Monday.  I suspect this is the cause of her shortness of breath, not COPD or reactive airway disease.  The DuoNeb did not make much of a difference for her breathing, but the diuresis may help.  She does not want to diuresis in the ED out of concern for needing to urinate frequently, prefer to fill the prescription today and take it at home.  Her sister will be taking her home.  I think this is a reasonable plan, she is otherwise not requiring hospitalization.      Final diagnoses:  Acute pulmonary edema (HCC)  Shortness of breath    ED Discharge Orders           Ordered    furosemide  (LASIX ) 40 MG tablet  Daily        06/09/24 1356               Cottie Donnice PARAS, MD 06/09/24 1358

## 2024-06-09 NOTE — Consult Note (Signed)
 NEUROLOGY CONSULT NOTE   Date of service: June 09, 2024 Patient Name: Terry Arroyo MRN:  [id number] DOB:  22-Sep-1961 Chief Complaint: Code stroke  Requesting Provider: Cottie Donnice PARAS, MD  History of Present Illness  Terry Arroyo is a 63 y.o. female with hx of prior strokes, HTN, HLD, hx of DVT, CAD s/p CABG, DM, OSA, CHF, ESRD on HD M, W,F, COPD, anxiety and depression, who presents via EMS as a code stroke for trouble speaking and confusion. EMS states they were originally called out for SOB, however noticed above symptoms and activated a code stroke. On [place] the bridge, she appears to be very anxious, no focal neurological deficits. Ct head with no acute process. NIHSS 3.She c/o SOB that has been ongoing for the last few days and she is not able to lay flat. She had HD yesterday. She has been using her inhalers which have not been helping her breathing.   LKW: unclear Modified rankin score: 2-Slight disability-UNABLE to perform all activities but does not need assistance IV Thrombolysis:  No not a stroke  EVT:  No LVO  NIHSS components Score: Comment  1a Level of Conscious 0[x]  1[]  2[]  3[]      1b LOC Questions 0[x]  1[]  2[]       1c LOC Commands 0[x]  1[]  2[]       2 Best Gaze 0[x]  1[]  2[]       3 Visual 0[x]  1[]  2[]  3[]      4 Facial Palsy 0[x]  1[]  2[]  3[]      5a Motor Arm - left 0[x]  1[]  2[]  3[]  4[]  UN[]    5b Motor Arm - Right 0[x]  1[]  2[]  3[]  4[]  UN[]    6a Motor Leg - Left 0[x]  1[]  2[]  3[]  4[]  UN[]    6b Motor Leg - Right 0[x]  1[]  2[]  3[]  4[]  UN[]    7 Limb Ataxia 0[x]  1[]  2[]  UN[]      8 Sensory 0[x]  1[]  2[]  UN[]      9 Best Language 0[x]  1[]  2[]  3[]      10 Dysarthria 0[]  1[x]  2[]  UN[]      11 Extinct. and Inattention 0[x]  1[]  2[]       TOTAL: 1      ROS   Comprehensive ROS performed and pertinent positives documented in HPI    Past History   Past Medical History:  Diagnosis Date   ACS (acute coronary syndrome) (HCC) 03/24/2019   Acute ischemic left MCA stroke  (HCC) 04/23/2024   Acute on chronic systolic CHF (congestive heart failure) (HCC) 02/26/2020   Acute pain of left thigh 02/09/2022   Acute pulmonary edema (HCC)    Acute renal failure superimposed on stage 4 chronic kidney disease (HCC)    Anasarca 02/26/2020   Anemia    Angina at rest [facility])    Angioedema 03/20/2021   Lisinopril    Anterior cerebral aneurysm    approximately 5 mm; left MCA    Anxiety    Aortic atherosclerosis (HCC)    Asthma    Benign hypertensive kidney disease with chronic kidney disease 03/10/2020   Blind left eye    CHF (congestive heart failure) (HCC)    COPD (chronic obstructive pulmonary disease) (HCC)    COPD exacerbation (HCC) 10/10/2020   Coronary artery disease    COVID-19 virus infection 03/24/2019   Depression    DOE (dyspnea on exertion)    DVT (deep venous thrombosis) (HCC) 12/30/2016   Left leg dvt as per pt while she was in hospital for cabg  Is on  eliquis since august  Doppler done in [place]  Shows   acute  occlusive deep vein  thrombosis  noted in the soleal  vein (a short  segment) at the  mid calf  3/18 doppler is negative     Last Assessment & Plan:   Will rpt doppler   If negative will stop eliquis  Formatting of this note might be different from the original.  Left leg dv   ESRD (end stage renal disease) (HCC)    Gait instability    Genital herpes    GERD (gastroesophageal reflux disease)    HFrEF (heart failure with reduced ejection fraction) (HCC)    History of 2019 novel coronavirus disease (COVID-19) 03/2019   [facility] [place]    History of DVT (deep vein thrombosis)    History of DVT (deep vein thrombosis)    Hx of CABG 2018   x 4; performed in [place]    Hyperlipidemia    Hypertension    Hypertensive urgency 11/24/2019   MI (myocardial infarction) (HCC) 2018   no stents   Pituitary adenoma (HCC)    PVD (posterior vitreous detachment), right eye    Secondary hyperparathyroidism (HCC)    Sepsis (HCC) 09/05/2021    Sleep apnea    non-compliant with nocturnal PAP therapy   Stroke [facility]) 2014   stated affected left side.   T2DM (type 2 diabetes mellitus) [facility])     Past Surgical History:  Procedure Laterality Date   A/V FISTULAGRAM Right 12/29/2021   Procedure: A/V FISTULAGRAM;  Surgeon: Jama Cordella MATSU, MD;  Location: [facility] INVASIVE CV LAB;  Service: Cardiovascular;  Laterality: Right;   A/V FISTULAGRAM Right 06/15/2022   Procedure: A/V Fistulagram;  Surgeon: Jama Cordella MATSU, MD;  Location: [facility] INVASIVE CV LAB;  Service: Cardiovascular;  Laterality: Right;   A/V FISTULAGRAM Right 04/17/2024   Procedure: A/V Fistulagram;  Surgeon: Jama Cordella MATSU, MD;  Location: [facility] INVASIVE CV LAB;  Service: Cardiovascular;  Laterality: Right;   AV FISTULA PLACEMENT Right 01/28/2021   Procedure: ARTERIOVENOUS (AV) FISTULA CREATION;  Surgeon: Jama Cordella MATSU, MD;  Location: [facility] ORS;  Service: Vascular;  Laterality: Right;   AV FISTULA PLACEMENT Right 05/13/2021   Procedure: INSERTION OF ARTERIOVENOUS (AV) GORE-TEX GRAFT ARM ( BRACHIAL AXILLARY );  Surgeon: Jama Cordella MATSU, MD;  Location: [facility] ORS;  Service: Vascular;  Laterality: Right;   BRAIN SURGERY  1986   pituitary tumor   BREAST EXCISIONAL BIOPSY Left yrs ago   benign   CESAREAN SECTION     COLONOSCOPY N/A 05/26/2023   Procedure: COLONOSCOPY;  Surgeon: Onita Elspeth Sharper, DO;  Location: [facility] ENDOSCOPY;  Service: Gastroenterology;  Laterality: N/A;  DIALYSIS   CORONARY ARTERY BYPASS GRAFT  2018   x 4 vessels; performed in [place]    DIALYSIS/PERMA CATHETER INSERTION N/A 11/07/2020   Procedure: DIALYSIS/PERMA CATHETER INSERTION;  Surgeon: Marea Selinda RAMAN, MD;  Location: [facility] INVASIVE CV LAB;  Service: Cardiovascular;  Laterality: N/A;   DIALYSIS/PERMA CATHETER REMOVAL N/A 06/12/2021   Procedure: DIALYSIS/PERMA CATHETER REMOVAL;  Surgeon: Marea Selinda RAMAN, MD;  Location: [facility] INVASIVE CV LAB;  Service: Cardiovascular;  Laterality: N/A;    ESOPHAGOGASTRODUODENOSCOPY (EGD) WITH PROPOFOL  N/A 12/22/2021   Procedure: ESOPHAGOGASTRODUODENOSCOPY (EGD) WITH PROPOFOL ;  Surgeon: Maryruth Ole DASEN, MD;  Location: [facility] ENDOSCOPY;  Service: Gastroenterology;  Laterality: N/A;  IDDM   EYE SURGERY Right 2021   cataracts    IR ANGIO INTRA EXTRACRAN SEL COM CAROTID INNOMINATE UNI R MOD SED  05/01/2024   IR CT HEAD LTD  04/23/2024   IR PERCUTANEOUS ART THROMBECTOMY/INFUSION INTRACRANIAL INC DIAG ANGIO  04/23/2024   IR US  GUIDE VASC ACCESS RIGHT  04/23/2024   PERIPHERAL VASCULAR THROMBECTOMY Right 04/03/2021   Procedure: A/V Fistulagram ;  Surgeon: Jama Cordella MATSU, MD;  Location: [facility] INVASIVE CV LAB;  Service: Cardiovascular;  Laterality: Right;   RADIOLOGY WITH ANESTHESIA N/A 04/23/2024   Procedure: RADIOLOGY WITH ANESTHESIA;  Surgeon: Radiologist, Medication, MD;  Location: [facility] OR;  Service: Radiology;  Laterality: N/A;   RADIOLOGY WITH ANESTHESIA N/A 05/01/2024   Procedure: RADIOLOGY WITH ANESTHESIA;  Surgeon: Dolphus Carrion, MD;  Location: [facility] OR;  Service: Radiology;  Laterality: N/A;  Right carotid revascularization    Family History: Family History  Problem Relation Age of Onset   CAD Mother    CAD Brother    Breast cancer Sister 87    Social History  reports that she quit smoking about 7 years ago. Her smoking use included cigarettes. She has never used smokeless tobacco. She reports that she does not currently use alcohol . She reports that she does not currently use drugs.  Allergies  Allergen Reactions   Hydralazine  Hives, Shortness Of Breath, Swelling and Rash    Body aches, also    Kiwi Extract Hives, Swelling, Rash and Other (See Comments)    Mouth swells    Lisinopril  Swelling and Other (See Comments)    Angioedema   Shellfish Allergy Anaphylaxis and Other (See Comments)    Shrimp/lobster    Betadine [Povidone Iodine ] Hives and Rash   Septra [Sulfamethoxazole-Trimethoprim] Other (See Comments)    Reaction not stated    Metformin Diarrhea and Other (See Comments)    GI Intolerance   Tape Itching    Use paper tape whenever possible    Medications   Current Facility-Administered Medications:    sodium chloride  flush (NS) 0.9 % injection 3 mL, 3 mL, Intravenous, Once, Trifan, Donnice PARAS, MD  Current Outpatient Medications:    acetaminophen  (TYLENOL ) 500 MG tablet, Take 1,000 mg by mouth every 6 (six) hours as needed for mild pain (pain score 1-3) or moderate pain (pain score 4-6)., Disp: , Rfl:    albuterol  (VENTOLIN  HFA) 108 (90 Base) MCG/ACT inhaler, Inhale 2 puffs into the lungs every 6 (six) hours as needed for wheezing or shortness of breath., Disp: , Rfl:    aspirin  81 MG EC tablet, Take 1 tablet (81 mg total) by mouth daily., Disp: 30 tablet, Rfl: 0   atorvastatin  (LIPITOR ) 80 MG tablet, Take 1 tablet (80 mg total) by mouth daily at 6 PM., Disp: 30 tablet, Rfl: 0   B Complex-C-Folic Acid  (RENA-VITE RX) 1 MG TABS, Take 1 tablet by mouth daily., Disp: , Rfl:    ezetimibe  (ZETIA ) 10 MG tablet, Take 1 tablet (10 mg total) by mouth daily. (Patient not taking: Reported on 04/29/2024), Disp: 30 tablet, Rfl: 2   gabapentin  (NEURONTIN ) 300 MG capsule, Take 300 mg by mouth at bedtime., Disp: , Rfl:    lidocaine -prilocaine  (EMLA ) cream, Apply 1 Application topically every Monday, Wednesday, and Friday with hemodialysis., Disp: , Rfl:    methocarbamol  (ROBAXIN ) 500 MG tablet, Take 1.5 tablets (750 mg total) by mouth every 6 (six) hours as needed for muscle spasms., Disp: , Rfl:    mirtazapine  (REMERON ) 30 MG tablet, Take 30 mg by mouth at bedtime., Disp: , Rfl:    NOVOLOG  MIX 70/30 FLEXPEN (70-30) 100 UNIT/ML FlexPen, Inject 7-15 Units into the skin 3 (  three) times daily with meals., Disp: , Rfl:    oxyCODONE -acetaminophen  (PERCOCET/ROXICET) 5-325 MG tablet, Take 1 tablet by mouth every 6 (six) hours as needed for up to 4 doses for severe pain (pain score 7-10)., Disp: 4 tablet, Rfl: 0   senna-docusate (SENOKOT-S)  8.6-50 MG tablet, Take 1 tablet by mouth at bedtime as needed for mild constipation or moderate constipation., Disp: , Rfl:    ticagrelor  (BRILINTA ) 90 MG TABS tablet, Take 1 tablet (90 mg total) by mouth 2 (two) times daily. (Patient not taking: Reported on 04/29/2024), Disp: 60 tablet, Rfl: 2   topiramate  (TOPAMAX ) 50 MG tablet, Take 50 mg by mouth daily., Disp: , Rfl:    traZODone  (DESYREL ) 50 MG tablet, Take 0.5 tablets (25 mg total) by mouth at bedtime. (Patient taking differently: Take 50 mg by mouth at bedtime.), Disp: , Rfl:    vitamin B-12 (CYANOCOBALAMIN ) 1000 MCG tablet, Take 1,000 mcg by mouth daily., Disp: , Rfl:   Vitals   Vitals:   06/09/24 1110 06/09/24 1111  BP: (!) 142/66   Pulse: (!) 58   Resp: 18   SpO2: 95%   Weight:  77.6 kg    Body mass index is 27.61 kg/m (pended).   Physical Exam   Constitutional: Appears well-developed and well-nourished.   Psych: anxious and crying  Eyes: No scleral injection.   HENT: No OP obstruction.   Head: Normocephalic.   Cardiovascular: Normal rate and regular rhythm.   Respiratory: Effort normal, non-labored breathing.   GI: Soft.  No distension. There is no tenderness.   Skin: WDI.    Neurologic Examination   Mental Status -  Level of arousal and orientation to time, place, and person were intact . Mild dysarthria  Language including expression, naming, repetition, comprehension was assessed and found intact . Attention span and concentration were normal . Recent and remote memory were intact . Fund of Knowledge was assessed and was intact .  Cranial Nerves II - XII - II - Visual field intact OU . III, IV, VI - Extraocular movements intact . V - Facial sensation intact bilaterally . VII - Facial movement intact bilaterally . VIII - Hearing & vestibular intact bilaterally . X - Palate elevates symmetrically . XI - Chin turning & shoulder shrug intact bilaterally . XII - Tongue protrusion intact .  Motor Strength -  The patient's strength was normal in all extremities and pronator drift was absent.  Bulk was normal and fasciculations were absent .   Motor Tone - Muscle tone was assessed at the neck and appendages and was normal . Sensory - inconsistent with right arm decreased and left leg decreased   Coordination - The patient had normal movements in the hands and feet with no ataxia or dysmetria.  Tremor was absent .  Gait and Station - deferred.  Labs/Imaging/Neurodiagnostic studies   CBC: No results for input(s): WBC, NEUTROABS, HGB, HCT, MCV, PLT in the last 168 hours. Basic Metabolic Panel:  Lab Results  Component Value Date   NA 132 (L) 05/03/2024   K 4.3 05/03/2024   CO2 23 05/03/2024   GLUCOSE 136 (H) 05/03/2024   BUN 29 (H) 05/03/2024   CREATININE 6.20 (H) 05/03/2024   CALCIUM  9.1 05/03/2024   GFRNONAA 7 (L) 05/03/2024   GFRAA 18 (L) 09/15/2020   Lipid Panel:  Lab Results  Component Value Date   LDLCALC 75 04/24/2024   HgbA1c:  Lab Results  Component Value Date   HGBA1C 8.6 (H)  04/23/2024   Urine Drug Screen:     Component Value Date/Time   LABOPIA NONE DETECTED 02/26/2020 1620   COCAINSCRNUR NONE DETECTED 02/26/2020 1620   LABBENZ NONE DETECTED 02/26/2020 1620   AMPHETMU NONE DETECTED 02/26/2020 1620   THCU NONE DETECTED 02/26/2020 1620   LABBARB NONE DETECTED 02/26/2020 1620    Alcohol  Level     Component Value Date/Time   ETH <15 04/29/2024 1255   INR  Lab Results  Component Value Date   INR 1.1 04/30/2024   APTT  Lab Results  Component Value Date   APTT 32 04/29/2024   AED levels: No results found for: PHENYTOIN, ZONISAMIDE, LAMOTRIGINE, LEVETIRACETA  CT Head without contrast(Personally reviewed):  No acute process    ASSESSMENT   SEDRA MORFIN is a 63 y.o. female hx of prior strokes, HTN, HLD, hx of DVT, CAD s/p CABG, DM, OSA, CHF, ESRD on HD M, W,F, COPD, anxiety and depression, who presents via EMS as a code stroke for  trouble speaking and confusion. EMS states they were originally called out for SOB, however noticed above symptoms and activated a code stroke. Unclear LKW.  Patient reports shortness of breath started last night, unable to sleep and called EMS since this was persistent this AM. She is crying in the ED, appears stressed. With encourgament and some deep breaths, she is able to answer all questions and follow all commands.  Overall suspect presentation is probably freeze response in the setting of stress. If this truly was a stroke, I would not expect her symptoms to pretty much resolve with encouragement and deep breaths. She had similar episode back in may of this year and MRI Brain was negative. She was thought to be a TIA. However, there was concern for functional overlay.  RECOMMENDATIONS   Cancel Code stroke  Please call  with questions or concerns   ______________________________________________________________________    Signed, Karna DELENA Geralds, NP [facility]  NEUROHOSPITALIST ADDENDUM Performed a face to face diagnostic evaluation.   I have reviewed the contents of history and physical exam as documented by PA/ARNP/Resident and agree with above documentation.  I have discussed and formulated the above plan as documented. Edits to the note have been made as needed.   Amadeus Oyama, MD [facility] [phone]   If 7pm to 7am, please call on call as listed on AMION.

## 2024-06-09 NOTE — ED Notes (Addendum)
 CCMD notified for monitoring

## 2024-06-11 ENCOUNTER — Emergency Department

## 2024-06-11 ENCOUNTER — Other Ambulatory Visit: Payer: Self-pay

## 2024-06-11 ENCOUNTER — Emergency Department
Admission: EM | Admit: 2024-06-11 | Discharge: 2024-06-11 | Disposition: A | Discharge status: Home / Self Care | Attending: Emergency Medicine | Admitting: Emergency Medicine

## 2024-06-11 DIAGNOSIS — Z8673 Personal history of transient ischemic attack (TIA), and cerebral infarction without residual deficits: Secondary | ICD-10-CM | POA: Diagnosis not present

## 2024-06-11 DIAGNOSIS — N186 End stage renal disease: Secondary | ICD-10-CM | POA: Insufficient documentation

## 2024-06-11 DIAGNOSIS — J449 Chronic obstructive pulmonary disease, unspecified: Secondary | ICD-10-CM | POA: Diagnosis not present

## 2024-06-11 DIAGNOSIS — Z992 Dependence on renal dialysis: Secondary | ICD-10-CM | POA: Diagnosis not present

## 2024-06-11 DIAGNOSIS — E8770 Fluid overload, unspecified: Secondary | ICD-10-CM | POA: Diagnosis not present

## 2024-06-11 DIAGNOSIS — R0602 Shortness of breath: Secondary | ICD-10-CM | POA: Insufficient documentation

## 2024-06-11 LAB — BASIC METABOLIC PANEL WITH GFR
Anion gap: 13 (ref 5–15)
BUN: 51 mg/dL — ABNORMAL HIGH (ref 8–23)
CO2: 26 mmol/L (ref 22–32)
Calcium: 8.8 mg/dL — ABNORMAL LOW (ref 8.9–10.3)
Chloride: 96 mmol/L — ABNORMAL LOW (ref 98–111)
Creatinine, Ser: 8 mg/dL — ABNORMAL HIGH (ref 0.44–1.00)
GFR, Estimated: 5 mL/min — ABNORMAL LOW (ref >60)
Glucose, Bld: 172 mg/dL — ABNORMAL HIGH (ref 70–99)
Potassium: 5.6 mmol/L — ABNORMAL HIGH (ref 3.5–5.1)
Sodium: 135 mmol/L (ref 135–145)

## 2024-06-11 LAB — TROPONIN I (HIGH SENSITIVITY)
Troponin I (High Sensitivity): 195 ng/L (ref <18)
Troponin I (High Sensitivity): 213 ng/L (ref <18)

## 2024-06-11 LAB — CBC
HCT: 32.9 % — ABNORMAL LOW (ref 36.0–46.0)
Hemoglobin: 10.3 g/dL — ABNORMAL LOW (ref 12.0–15.0)
MCH: 29.5 pg (ref 26.0–34.0)
MCHC: 31.3 g/dL (ref 30.0–36.0)
MCV: 94.3 fL (ref 80.0–100.0)
Platelets: 180 10*3/uL (ref 150–400)
RBC: 3.49 MIL/uL — ABNORMAL LOW (ref 3.87–5.11)
RDW: 14.6 % (ref 11.5–15.5)
WBC: 6 10*3/uL (ref 4.0–10.5)
nRBC: 0 % (ref 0.0–0.2)

## 2024-06-11 LAB — HEPATITIS B SURFACE ANTIGEN: Hepatitis B Surface Ag: NONREACTIVE

## 2024-06-11 MED ORDER — CHLORHEXIDINE GLUCONATE CLOTH 2 % EX PADS: 6.0000 | MEDICATED_PAD | Freq: Every day | CUTANEOUS | Status: DC

## 2024-06-11 MED ORDER — IPRATROPIUM-ALBUTEROL 0.5-2.5 (3) MG/3ML IN SOLN
3.0000 mL | Freq: Once | RESPIRATORY_TRACT | Status: AC
  Administered 2024-06-11: 3 mL via RESPIRATORY_TRACT
  Filled 2024-06-11: qty 3

## 2024-06-11 MED ORDER — HYDROXYZINE HCL 25 MG PO TABS
50.0000 mg | ORAL_TABLET | Freq: Once | ORAL | Status: AC
  Administered 2024-06-11: 50 mg via ORAL
  Filled 2024-06-11: qty 2

## 2024-06-11 NOTE — ED Notes (Signed)
 Called lab to obtain second troponin

## 2024-06-11 NOTE — ED Notes (Signed)
 This RN attempted PIV x1 with no success, lab called and states we will be there when we can we only have 1 phlebotomist

## 2024-06-11 NOTE — ED Notes (Signed)
 Pt transported to dialysis

## 2024-06-11 NOTE — ED Provider Notes (Signed)
 [facility] Provider Note   Event Date/Time   First MD Initiated Contact with Patient 06/11/24 1018     (approximate) History  Shortness of Breath  HPI Terry Arroyo is a 63 y.o. female with a past medical history of end-stage renal disease on dialysis Monday/Wednesday/Friday as well as recent CVA with verbal deficits who presents complaining of shortness of breath from dialysis.  Patient states I just do not feel right.  Patient states that she has been feeling this way since her stroke.  With no change in her neurologic function today.  Patient states that she has slurred speech from this previous stroke.  Patient does endorse mild lightheadedness that is worse with walking.  Patient denies any chest pain over the last few days ROS: Patient currently denies any vision changes, tinnitus, difficulty speaking, facial droop, sore throat, chest pain, abdominal pain, nausea/vomiting/diarrhea, dysuria, or weakness/numbness/paresthesias in any extremity   Physical Exam  Triage Vital Signs: ED Triage Vitals [06/11/24 0959]  Encounter Vitals Group     BP 135/64     Girls Systolic BP Percentile      Girls Diastolic BP Percentile      Boys Systolic BP Percentile      Boys Diastolic BP Percentile      Pulse Rate (!) 57     Resp 20     Temp 97.7 F (36.5 C)     Temp Source Oral     SpO2 98 %     Weight 171 lb 1.2 oz (77.6 kg)     Height 5' 6 (1.676 m)     Head Circumference      Peak Flow      Pain Score 0     Pain Loc      Pain Education      Exclude from Growth Chart    Most recent vital signs: Vitals:   06/11/24 1500 06/11/24 1530  BP: 138/67 130/68  Pulse: (!) 53 (!) 54  Resp:    Temp:    SpO2: 100% 99%   General: Awake, oriented x4. CV:  Good peripheral perfusion. Resp:  Normal effort. Abd:  No distention. Other:  Middle-aged obese African-American female resting comfortably in no acute distress ED Results / Procedures / Treatments   Labs (all labs ordered are listed, but only abnormal results are displayed) Labs Reviewed  BASIC METABOLIC PANEL WITH GFR - Abnormal; Notable for the following components:      Result Value   Potassium 5.6 (*)    Chloride 96 (*)    Glucose, Bld 172 (*)    BUN 51 (*)    Creatinine, Ser 8.00 (*)    Calcium  8.8 (*)    GFR, Estimated 5 (*)    All other components within normal limits  CBC - Abnormal; Notable for the following components:   RBC 3.49 (*)    Hemoglobin 10.3 (*)    HCT 32.9 (*)    All other components within normal limits  TROPONIN I (HIGH SENSITIVITY) - Abnormal; Notable for the following components:   Troponin I (High Sensitivity) 195 (*)    All other components within normal limits  TROPONIN I (HIGH SENSITIVITY) - Abnormal; Notable for the following components:   Troponin I (High Sensitivity) 213 (*)    All other components within normal limits  HEPATITIS B SURFACE ANTIGEN  HEPATITIS B SURFACE ANTIBODY, QUANTITATIVE   EKG ED ECG REPORT I, Artist MARLA Kerns, the attending physician, personally viewed  and interpreted this ECG. Date: 06/11/2024 EKG Time: 0955 Rate: 55 Rhythm: normal sinus rhythm QRS Axis: normal Intervals: normal ST/T Wave abnormalities: normal Narrative Interpretation: no evidence of acute ischemia RADIOLOGY ED MD interpretation: 2 view chest x-ray shows cardiomegaly with increasing vascular congestion and small pleural effusions - All radiology independently interpreted and agree with radiology assessment Official radiology report(s): DG Chest 2 View Result Date: 06/11/2024 CLINICAL DATA:  Shortness of breath since yesterday. Missed dialysis. History of COPD. EXAM: CHEST - 2 VIEW COMPARISON:  06/09/2024, 2 days prior FINDINGS: Prior median sternotomy. Cardiomegaly is stable. Unchanged mediastinal contours. Increasing vascular congestion. Suspect small pleural effusions. No confluent airspace disease. No pneumothorax. IMPRESSION: Cardiomegaly with  increasing vascular congestion and small pleural effusions. Electronically Signed   By: Andrea Gasman M.D.   On: 06/11/2024 12:35   PROCEDURES: Critical Care performed: No Procedures MEDICATIONS ORDERED IN ED: Medications  Chlorhexidine  Gluconate Cloth 2 % PADS 6 each (has no administration in time range)  ipratropium-albuterol  (DUONEB) 0.5-2.5 (3) MG/3ML nebulizer solution 3 mL (3 mLs Nebulization Given 06/11/24 1111)  hydrOXYzine (ATARAX) tablet 50 mg (50 mg Oral Given 06/11/24 1219)   IMPRESSION / MDM / ASSESSMENT AND PLAN / ED COURSE  I reviewed the triage vital signs and the nursing notes.                             The patient is on the cardiac monitor to evaluate for evidence of arrhythmia and/or significant heart rate changes. Patient's presentation is most consistent with acute presentation with potential threat to life or bodily function. Endorses dyspnea, endorses LE edema Denies Non adherence to medication regimen  Workup: ECG, CBC, BMP, Troponin, BNP, CXR Findings: EKG: No STEMI and no evidence of Brugada's sign, delta wave, epsilon wave, significantly prolonged QTc, or malignant arrhythmia. CXR: Cardiomegaly with vascular congestion Based on history, exam and findings, presentation most consistent with acute on chronic heart failure. Low suspicion for PNA, ACS, tamponade, aortic dissection. Interventions: I spoke to Dr. Marcelino who agrees for expedited hemodialysis at this time  Disposition (ESRD, missed HD): Plan for expedited HD   FINAL CLINICAL IMPRESSION(S) / ED DIAGNOSES   Final diagnoses:  Shortness of breath  Hypervolemia, unspecified hypervolemia type   Rx / DC Orders   ED Discharge Orders     None      Note:  This document was prepared using Dragon voice recognition software and may include unintentional dictation errors.   Momoko Slezak K, MD 06/11/24 1540

## 2024-06-11 NOTE — ED Notes (Signed)
 Patient currently waiting for ride home.  Family member will be here shortly.

## 2024-06-11 NOTE — ED Notes (Signed)
 Lab at beside trying to obtain bloodwork

## 2024-06-11 NOTE — ED Triage Notes (Addendum)
 Pt states SOB since yesterday. Pt states she was on the way to dialysis. Pt did not get her treatment. Pt states HX of COPD, pt has slurred speech from a previous stroke 3 weeks ago.   Pt states they put a stent in my head when I had my stroke. Pt endorses dizziness and states I just don't feel right, pt states this has been ongoing since /stroke stents were placed. Pt states dizziness worse when walking. Pt is A&Ox4.

## 2024-06-11 NOTE — Progress Notes (Addendum)
 Hemodialysis Note:  Received patient in bed to unit. Alert and oriented. Informed consent singed and in chart.  Treatment initiated: 1450 Treatment completed: 1757  Access used: Right AVF Access issues: None  Rinse back 30 minutes early due to leg cramps.200 ml saline bolus given.Transported back to room, alert without acute distress. Report given to patient's RN.  Total UF removed: 1.4 Liters Medications given: None  Post HD weight: 80.2 Kg  [facility]

## 2024-06-12 LAB — HEPATITIS B SURFACE ANTIBODY, QUANTITATIVE: Hep B S AB Quant (Post): 54.9 m[IU]/mL

## 2024-06-15 ENCOUNTER — Encounter (HOSPITAL_COMMUNITY): Payer: Self-pay | Admitting: Interventional Radiology

## 2024-06-21 ENCOUNTER — Inpatient Hospital Stay: Admitting: Diagnostic Neuroimaging

## 2024-06-26 ENCOUNTER — Inpatient Hospital Stay: Admitting: Neurology

## 2024-06-28 ENCOUNTER — Other Ambulatory Visit: Payer: Self-pay

## 2024-06-28 ENCOUNTER — Other Ambulatory Visit (HOSPITAL_COMMUNITY): Payer: Self-pay

## 2024-07-02 ENCOUNTER — Telehealth: Payer: Self-pay | Admitting: Family

## 2024-07-02 NOTE — Progress Notes (Unsigned)
 Advanced [facility] Failure Clinic Note    PCP: Lenon Layman ORN, MD Cardiologist: Florencio Kava, MD (last seen 03/23; upcoming appt 07/25 with Dr. Wilburn)  Chief Complaint: shortness of breath  HPI:  Terry Arroyo is a 63 y/o female with a history of stroke (05/25), DM, hyperlipidemia, HTN, brain tumor, left eye blindness, previous tobacco use and chronic heart failure.   Echo 02/27/20: EF of 30-35% along with mild MR. Echo 06/13/21: EF of 35-40% along with mild MR and mild/moderate TR.   Admitted 12/07/23 due to dizziness, instability and shortness of breath. She felt like she was leaning to the left side. Did not go to dialysis today. BNP elevated. Head CT negative. Dialysis session done and symptoms much improved. Echo 12/08/23: EF 45-50% with mild LVH, Grade II DD, moderately elevated PA pressure of 51.8 mm Hg, moderate LAE, mild MR, mild/ moderate TR  Was in the ED 04/23/24 with 2 week history of back pain and right sided weakness. Code stroke activated. CTA was without obvious LVO, but did demonstrate significant stenosis for which perfusion study was recommended and a ultrasound guided 18-gauge IV was placed. Transferred to [facility].   Admitted 04/23/24 at [facility] after being transferred from OSH for acute stroke. Vas Carotid US -1 to 39% stenosis in right ICA, 50 to 74% stenosis in the left ICA. Found to have critical stenosis of the left ICA and was taken to interventional radiology for left ICA stenting. MRI no acute infarct, chronic dural venous sinus thrombosis with collaterals. Echo 04/25/24: EF 35 to 40%, grade 2 diastolic dysfunction with akinetic inferior and posterior wall, moderately dilated left atrium, moderate tricuspid regurgitation, no atrial level shunt. EEG within normal limits.    Admitted 04/29/24 with slurred speech, left sided weakness and left arm pain. Vital signs in the ED notable for blood pressure in the 110s-160s. CT head>>no acute abnormality. MRI brain  >>motion degradation but no acute abnormality. MRA of the head and neck>> showed similar carotid artery disease with greater than 70% stenosis on the right side and status post intervention on the left.  Also noted was stable aneurysm. Left elbow x-ray showed questionable radial neck fracture. Neurology consulted. Concern for right carotid artery stenosis underwent angiogram 05/01/24 and no intervention needed.    Was in the ED 06/09/24 with slurred and difficult speech along with shortness of breath. Speech resolved after ED arrival. Diuretic adjusted for 3 days.    Was in the ED 06/11/24 with shortness of breath after missing HD. Expedited HD session done.   She presents today for a HF follow-up visit with a chief complaint of moderate shortness of breath. Has been present for several months, ever since my stroke. Has associated fatigue, chest heaviness, dizziness and difficulty sleeping due to SOB. When walking, tends to walk off-balance. No change in neurological symptoms from stroke May 2025.  Getting dialysis on M, W, F. Takes her furosemide  on her non-dialysis days.   Past Medical History:  Diagnosis Date   ACS (acute coronary syndrome) (HCC) 03/24/2019   Acute ischemic left MCA stroke (HCC) 04/23/2024   Acute on chronic systolic CHF (congestive heart failure) (HCC) 02/26/2020   Acute pain of left thigh 02/09/2022   Acute pulmonary edema (HCC)    Acute renal failure superimposed on stage 4 chronic kidney disease (HCC)    Anasarca 02/26/2020   Anemia    Angina at rest [facility])    Angioedema 03/20/2021   Lisinopril    Anterior cerebral aneurysm  approximately 5 mm; left MCA    Anxiety    Aortic atherosclerosis (HCC)    Asthma    Benign hypertensive kidney disease with chronic kidney disease 03/10/2020   Blind left eye    CHF (congestive heart failure) (HCC)    COPD (chronic obstructive pulmonary disease) (HCC)    COPD exacerbation (HCC) 10/10/2020   Coronary artery disease     COVID-19 virus infection 03/24/2019   Depression    DOE (dyspnea on exertion)    DVT (deep venous thrombosis) (HCC) 12/30/2016   Left leg dvt as per pt while she was in hospital for cabg  Is on eliquis since august  Doppler done in [place]  Shows   acute  occlusive deep vein  thrombosis  noted in the soleal  vein (a short  segment) at the  mid calf  3/18 doppler is negative     Last Assessment & Plan:   Will rpt doppler   If negative will stop eliquis  Formatting of this note might be different from the original.  Left leg dv   ESRD (end stage renal disease) (HCC)    Gait instability    Genital herpes    GERD (gastroesophageal reflux disease)    HFrEF (heart failure with reduced ejection fraction) (HCC)    History of 2019 novel coronavirus disease (COVID-19) 03/2019   [facility] [place]    History of DVT (deep vein thrombosis)    History of DVT (deep vein thrombosis)    Hx of CABG 2018   x 4; performed in [place]    Hyperlipidemia    Hypertension    Hypertensive urgency 11/24/2019   MI (myocardial infarction) (HCC) 2018   no stents   Pituitary adenoma (HCC)    PVD (posterior vitreous detachment), right eye    Secondary hyperparathyroidism (HCC)    Sepsis (HCC) 09/05/2021   Sleep apnea    non-compliant with nocturnal PAP therapy   Stroke [facility]) 2014   stated affected left side.   T2DM (type 2 diabetes mellitus) (HCC)     Current Outpatient Medications  Medication Sig Dispense Refill   acetaminophen  (TYLENOL ) 500 MG tablet Take 1,000 mg by mouth every 6 (six) hours as needed for mild pain (pain score 1-3) or moderate pain (pain score 4-6).     albuterol  (VENTOLIN  HFA) 108 (90 Base) MCG/ACT inhaler Inhale 2 puffs into the lungs every 6 (six) hours as needed for wheezing or shortness of breath.     aspirin  81 MG EC tablet Take 1 tablet (81 mg total) by mouth daily. 30 tablet 0   atorvastatin  (LIPITOR ) 80 MG tablet Take 1 tablet (80 mg total) by mouth daily at 6 PM. 30  tablet 0   B Complex-C-Folic Acid  (RENA-VITE RX) 1 MG TABS Take 1 tablet by mouth daily.     ezetimibe  (ZETIA ) 10 MG tablet Take 1 tablet (10 mg total) by mouth daily. (Patient not taking: Reported on 04/29/2024) 30 tablet 2   furosemide  (LASIX ) 40 MG tablet Take 1 tablet (40 mg total) by mouth daily for 3 days. 3 tablet 0   gabapentin  (NEURONTIN ) 300 MG capsule Take 300 mg by mouth at bedtime.     lidocaine -prilocaine  (EMLA ) cream Apply 1 Application topically every Monday, Wednesday, and Friday with hemodialysis.     methocarbamol  (ROBAXIN ) 500 MG tablet Take 1.5 tablets (750 mg total) by mouth every 6 (six) hours as needed for muscle spasms.     mirtazapine  (REMERON ) 30 MG  tablet Take 30 mg by mouth at bedtime.     NOVOLOG  MIX 70/30 FLEXPEN (70-30) 100 UNIT/ML FlexPen Inject 7-15 Units into the skin 3 (three) times daily with meals.     oxyCODONE -acetaminophen  (PERCOCET/ROXICET) 5-325 MG tablet Take 1 tablet by mouth every 6 (six) hours as needed for up to 4 doses for severe pain (pain score 7-10). 4 tablet 0   senna-docusate (SENOKOT-S) 8.6-50 MG tablet Take 1 tablet by mouth at bedtime as needed for mild constipation or moderate constipation.     ticagrelor  (BRILINTA ) 90 MG TABS tablet Take 1 tablet (90 mg total) by mouth 2 (two) times daily. (Patient not taking: Reported on 04/29/2024) 60 tablet 2   topiramate  (TOPAMAX ) 50 MG tablet Take 50 mg by mouth daily.     traZODone  (DESYREL ) 50 MG tablet Take 0.5 tablets (25 mg total) by mouth at bedtime. (Patient taking differently: Take 50 mg by mouth at bedtime.)     vitamin B-12 (CYANOCOBALAMIN ) 1000 MCG tablet Take 1,000 mcg by mouth daily.     No current facility-administered medications for this visit.    Allergies  Allergen Reactions   Hydralazine  Hives, Shortness Of Breath, Swelling and Rash    Body aches, also    Kiwi Extract Hives, Swelling, Rash and Other (See Comments)    Mouth swells    Lisinopril  Swelling and Other (See Comments)     Angioedema   Shellfish Allergy Anaphylaxis and Other (See Comments)    Shrimp/lobster    Betadine [Povidone Iodine ] Hives and Rash   Septra [Sulfamethoxazole-Trimethoprim] Other (See Comments)    Reaction not stated   Metformin Diarrhea and Other (See Comments)    GI Intolerance   Tape Itching    Use paper tape whenever possible      Social History   Socioeconomic History   Marital status: Single    Spouse name: Not on file   Number of children: Not on file   Years of education: Not on file   Highest education level: Not on file  Occupational History    Comment: disabled  Tobacco Use   Smoking status: Former    Current packs/day: 0.00    Types: Cigarettes    Quit date: 2018    Years since quitting: 7.5   Smokeless tobacco: Never  Vaping Use   Vaping status: Never Used  Substance and Sexual Activity   Alcohol  use: Not Currently   Drug use: Not Currently   Sexual activity: Not Currently  Other Topics Concern   Not on file  Social History Narrative   Patient lives alone.  Feels safe in her home.   Social Drivers of Corporate investment banker Strain: Low Risk  (10/13/2023)   Received from [facility]   Overall Financial Resource Strain (CARDIA)    Difficulty of Paying Living Expenses: Not very hard  Food Insecurity: No Food Insecurity (04/29/2024)   Hunger Vital Sign    Worried About Running Out of Food in the Last Year: Never true    Ran Out of Food in the Last Year: Never true  Transportation Needs: No Transportation Needs (04/29/2024)   PRAPARE - Administrator, Civil Service (Medical): No    Lack of Transportation (Non-Medical): No  Physical Activity: Not on file  Stress: Not on file  Social Connections: Unknown (04/29/2024)   Social Connection and Isolation Panel    Frequency of Communication with Friends and Family: Twice a week    Frequency  of Social Gatherings with Friends and Family: Twice a week    Attends Religious  Services: 1 to 4 times per year    Active Member of [employer] or Organizations: No    Attends Banker Meetings: Never    Marital Status: Patient declined  Catering manager Violence: Not At Risk (04/29/2024)   Humiliation, Afraid, Rape, and Kick questionnaire    Fear of Current or Ex-Partner: No    Emotionally Abused: No    Physically Abused: No    Sexually Abused: No      Family History  Problem Relation Age of Onset   CAD Mother    CAD Brother    Breast cancer Sister 73   Vitals:   07/03/24 1041  BP: 129/60  Pulse: (!) 56  SpO2: 95%  Weight: 167 lb (75.8 kg)   Wt Readings from Last 3 Encounters:  07/03/24 167 lb (75.8 kg)  06/11/24 176 lb 12.9 oz (80.2 kg)  06/09/24 171 lb 1.2 oz (77.6 kg)   Lab Results  Component Value Date   CREATININE 8.00 (H) 06/11/2024   CREATININE 5.16 (H) 06/09/2024   CREATININE 6.20 (H) 05/03/2024    PHYSICAL EXAM:  General: Well appearing female with a walker. No resp difficulty HEENT: normal Neck: supple, no JVD Cor: Regular rhythm, bradycardic. No rubs, gallops or murmurs Lungs: clear Abdomen: soft, nontender, nondistended. Extremities: no cyanosis, clubbing, rash, 1+ pitting edema bilateral lower legs Neuro: alert & oriented X 3. Moves all 4 extremities w/o difficulty. Affect pleasant   ECG: not done  ReDs reading: 43 %, abnormal (see below)   ASSESSMENT & PLAN:  1: NICM with reduced ejection fraction- - suspect due to HTN/ renal disease - NYHA Arroyo III - fluid up today with elevated ReDs reading (today is non-HD day) & symptomls - weighing at dialysis as she says that her weight fluctuates in between sessions - weight down 3 pounds from last visit here 6 months ago - ReDs today is 43% (increasing furosemide  per below) - Echo 02/27/20: EF of 30-35% along with mild MR. - Echo 06/13/21: EF of 35-40% along with mild MR and mild/moderate TR.  - Echo 12/08/23: EF 45-50% with mild LVH, Grade II DD, moderately elevated PA  pressure of 51.8 mm Hg, moderate LAE, mild MR, mild/ moderate TR - Echo 04/25/24: EF 35 to 40%, grade 2 diastolic dysfunction with akinetic inferior and posterior wall, moderately dilated left atrium, moderate tricuspid regurgitation, no atrial level shunt. - continue carvedilol  25mg  BID - increase furosemide  to 120mg  on non-dialysis days (T, TH, Sa, Su) as she still makes urine - renal function limits other GDMT - not adding salt  - saw cardiology Philippe) 03/23; has upcoming appointment w/ Dr. Wilburn 07/12/24 (~10 days)  - BNP 06/09/24 was 3464.5 - BNP today  2: HTN- - BP 129/60 - continue amlodipine  10mg  daily - saw PCP Lucio) 06/25 - BMET today  3: DM- - saw endocrinology Coye) 07/21 - A1c 04/23/24 was 8.6%  4: ESRD- - BMP 06/11/24 reviewed: sodium 135, potassium 5.6, creatinine 8.00 and GFR 5 - saw nephrology (Kolluru) 11/22 - saw vascular Luna) 02/25 - dialysis M, W, F  5: Stroke- - 05/25 Vas Carotid US -1 to 39% stenosis in right ICA, 50 to 74% stenosis in the left ICA. Found to have critical stenosis of the left ICA and was taken to interventional radiology for left ICA stenting. - 05/25 MRI no acute infarct, chronic dural venous sinus thrombosis with collaterals. -  reports having SOB and weakness since stroke 05/25. Walking with a walker - sees neurology 07/17/24   She is unable to return later this week for f/u appointment post increasing lasix  as she comes with transportation and they require 3 days notice. Emphasized keeping the appt with Dr. Wilburn and since she will be following with him, will not make a return appt back here. She can return here PRN.    Terry DELENA Class, FNP 07/02/24

## 2024-07-02 NOTE — Telephone Encounter (Signed)
 Called to confirm/remind patient of their appointment at the [facility] on 07/03/24.   Appointment:   [] Confirmed  [x] Left mess   [] No answer/No voice mail  [] VM Full/unable to leave message  [] Phone not in service  Patient reminded to bring all medications and/or complete list.  Confirmed patient has transportation. Gave directions, instructed to utilize valet parking.

## 2024-07-03 ENCOUNTER — Encounter: Payer: Self-pay | Admitting: Family

## 2024-07-03 ENCOUNTER — Other Ambulatory Visit
Admission: RE | Admit: 2024-07-03 | Discharge: 2024-07-03 | Disposition: A | Discharge status: Home / Self Care | Source: Ambulatory Visit | Attending: Family | Admitting: Family

## 2024-07-03 ENCOUNTER — Ambulatory Visit: Payer: Self-pay | Admitting: Family

## 2024-07-03 ENCOUNTER — Ambulatory Visit: Admitting: Family

## 2024-07-03 VITALS — BP 129/60 | HR 56 | Wt 167.0 lb

## 2024-07-03 DIAGNOSIS — E1122 Type 2 diabetes mellitus with diabetic chronic kidney disease: Secondary | ICD-10-CM | POA: Diagnosis not present

## 2024-07-03 DIAGNOSIS — I5022 Chronic systolic (congestive) heart failure: Secondary | ICD-10-CM | POA: Insufficient documentation

## 2024-07-03 DIAGNOSIS — I10 Essential (primary) hypertension: Secondary | ICD-10-CM

## 2024-07-03 DIAGNOSIS — Z794 Long term (current) use of insulin: Secondary | ICD-10-CM

## 2024-07-03 DIAGNOSIS — N186 End stage renal disease: Secondary | ICD-10-CM | POA: Diagnosis not present

## 2024-07-03 DIAGNOSIS — Z992 Dependence on renal dialysis: Secondary | ICD-10-CM

## 2024-07-03 DIAGNOSIS — I63232 Cerebral infarction due to unspecified occlusion or stenosis of left carotid arteries: Secondary | ICD-10-CM

## 2024-07-03 LAB — BASIC METABOLIC PANEL WITH GFR
Anion gap: 13 (ref 5–15)
BUN: 22 mg/dL (ref 8–23)
CO2: 27 mmol/L (ref 22–32)
Calcium: 8.7 mg/dL — ABNORMAL LOW (ref 8.9–10.3)
Chloride: 99 mmol/L (ref 98–111)
Creatinine, Ser: 5.76 mg/dL — ABNORMAL HIGH (ref 0.44–1.00)
GFR, Estimated: 8 mL/min — ABNORMAL LOW (ref >60)
Glucose, Bld: 154 mg/dL — ABNORMAL HIGH (ref 70–99)
Potassium: 3.9 mmol/L (ref 3.5–5.1)
Sodium: 139 mmol/L (ref 135–145)

## 2024-07-03 LAB — BRAIN NATRIURETIC PEPTIDE: B Natriuretic Peptide: 2954.6 pg/mL — ABNORMAL HIGH (ref 0.0–100.0)

## 2024-07-03 MED ORDER — FUROSEMIDE 40 MG PO TABS: 120.0000 mg | ORAL_TABLET | Freq: Every day | ORAL | 5 refills | Status: DC

## 2024-07-03 NOTE — Progress Notes (Signed)
 ReDS Vest / Clip - 07/03/24 1000       ReDS Vest / Clip   Station Marker A    Ruler Value 30    ReDS Value Range High volume overload    ReDS Actual Value 43

## 2024-07-03 NOTE — Patient Instructions (Signed)
 Medication Changes:  INCREASE YOUR LASIX  120 MG ONCE DAILY   Lab Work:  Go over to the [facility]. Go pass the gift shop and have your blood work completed.  We will only call you if the results are abnormal or if the provider would like to make medication changes.   Special Instructions // Education:  Follow up with [facility]. Elane at [facility].   Follow-Up with [facility], as needed for weight gain or any new or worsening symptoms.    At the [facility] Clinic, you and your health needs are our priority. We have a designated team specialized in the treatment of Heart Failure. This Care Team includes your primary Heart Failure Specialized Cardiologist (physician), Advanced Practice Providers (APPs- Physician Assistants and Nurse Practitioners), and Pharmacist who all work together to provide you with the care you need, when you need it.   You may see any of the following providers on your designated Care Team at your next follow up:  Dr. Toribio Fuel Dr. Ezra Shuck Dr. Ria Commander Dr. Odis Brownie Ellouise Class, FNP Jaun Bash, RPH-CPP  Please be sure to bring in all your medications bottles to every appointment.   Need to Contact Us :  If you have any questions or concerns before your next appointment please send us  a message through [place] or call our office at [phone].    TO LEAVE A MESSAGE FOR THE NURSE SELECT OPTION 2, PLEASE LEAVE A MESSAGE INCLUDING: YOUR NAME DATE OF BIRTH CALL BACK NUMBER REASON FOR CALL**this is important as we prioritize the call backs  YOU WILL RECEIVE A CALL BACK THE SAME DAY AS LONG AS YOU CALL BEFORE 4:00 PM

## 2024-07-31 ENCOUNTER — Other Ambulatory Visit (HOSPITAL_COMMUNITY): Payer: Self-pay

## 2024-08-01 ENCOUNTER — Other Ambulatory Visit (HOSPITAL_COMMUNITY): Payer: Self-pay

## 2024-08-07 ENCOUNTER — Telehealth: Payer: Self-pay

## 2024-08-07 NOTE — Patient Outreach (Signed)
 First telephone outreach attempt to obtain mRS. No answer. Unable to leave message for returned call.  [facility]  Population Health Care Management Assistant  Direct Dial: [phone]  Fax: [phone] Website: [name]

## 2024-08-09 ENCOUNTER — Telehealth: Payer: Self-pay

## 2024-08-09 NOTE — Patient Outreach (Signed)
 Second telephone outreach attempt to obtain mRS. No answer. Left message for returned call.  Shereen [facility]  Population Health Care Management Assistant  Direct Dial: [phone]  Fax: [phone] Website: [email or web address]

## 2024-08-10 ENCOUNTER — Telehealth: Payer: Self-pay

## 2024-08-10 NOTE — Patient Outreach (Signed)
 3 outreach attempts were completed to obtain mRs. mRs could not be obtained because patient never returned my calls. mRs=7    Shereen Gin [facility] Care Management Assistant  Direct Dial: [phone]  Fax: [phone] Website: [email or web address]

## 2024-08-15 ENCOUNTER — Other Ambulatory Visit (HOSPITAL_COMMUNITY): Payer: Self-pay

## 2024-08-22 ENCOUNTER — Other Ambulatory Visit: Payer: Self-pay

## 2024-08-24 ENCOUNTER — Other Ambulatory Visit (HOSPITAL_COMMUNITY): Payer: Self-pay

## 2024-08-28 ENCOUNTER — Other Ambulatory Visit (HOSPITAL_COMMUNITY): Payer: Self-pay

## 2024-08-30 ENCOUNTER — Ambulatory Visit
Admission: RE | Admit: 2024-08-30 | Discharge: 2024-08-30 | Disposition: A | Discharge status: Home / Self Care | Source: Ambulatory Visit | Attending: Nephrology | Admitting: Nephrology

## 2024-08-30 ENCOUNTER — Other Ambulatory Visit (HOSPITAL_COMMUNITY): Payer: Self-pay

## 2024-08-30 ENCOUNTER — Other Ambulatory Visit: Payer: Self-pay | Admitting: Nephrology

## 2024-08-30 ENCOUNTER — Ambulatory Visit
Admission: RE | Admit: 2024-08-30 | Discharge: 2024-08-30 | Disposition: A | Discharge status: Home / Self Care | Attending: Nephrology | Admitting: Nephrology

## 2024-08-30 DIAGNOSIS — R0602 Shortness of breath: Secondary | ICD-10-CM

## 2024-08-30 DIAGNOSIS — R0601 Orthopnea: Secondary | ICD-10-CM | POA: Diagnosis present

## 2024-09-04 ENCOUNTER — Other Ambulatory Visit (HOSPITAL_COMMUNITY): Payer: Self-pay

## 2024-09-05 ENCOUNTER — Other Ambulatory Visit (HOSPITAL_COMMUNITY): Payer: Self-pay

## 2024-09-06 ENCOUNTER — Other Ambulatory Visit (HOSPITAL_COMMUNITY): Payer: Self-pay

## 2024-09-07 ENCOUNTER — Other Ambulatory Visit (HOSPITAL_COMMUNITY): Payer: Self-pay

## 2024-09-10 ENCOUNTER — Other Ambulatory Visit (HOSPITAL_COMMUNITY): Payer: Self-pay

## 2024-09-11 ENCOUNTER — Encounter (HOSPITAL_COMMUNITY): Payer: Self-pay

## 2024-09-11 ENCOUNTER — Other Ambulatory Visit (HOSPITAL_COMMUNITY): Payer: Self-pay

## 2024-09-12 ENCOUNTER — Encounter: Payer: Self-pay | Admitting: Vascular Surgery

## 2024-09-24 ENCOUNTER — Other Ambulatory Visit: Payer: Self-pay

## 2024-09-24 ENCOUNTER — Encounter: Payer: Self-pay | Admitting: *Deleted

## 2024-09-24 ENCOUNTER — Emergency Department

## 2024-09-24 ENCOUNTER — Emergency Department
Admission: EM | Admit: 2024-09-24 | Discharge: 2024-09-25 | Disposition: A | Discharge status: Home / Self Care | Attending: Emergency Medicine | Admitting: Emergency Medicine

## 2024-09-24 DIAGNOSIS — M109 Gout, unspecified: Secondary | ICD-10-CM | POA: Insufficient documentation

## 2024-09-24 DIAGNOSIS — N186 End stage renal disease: Secondary | ICD-10-CM | POA: Insufficient documentation

## 2024-09-24 DIAGNOSIS — Z992 Dependence on renal dialysis: Secondary | ICD-10-CM | POA: Diagnosis not present

## 2024-09-24 DIAGNOSIS — M25531 Pain in right wrist: Secondary | ICD-10-CM | POA: Diagnosis present

## 2024-09-24 DIAGNOSIS — I509 Heart failure, unspecified: Secondary | ICD-10-CM | POA: Insufficient documentation

## 2024-09-24 DIAGNOSIS — J449 Chronic obstructive pulmonary disease, unspecified: Secondary | ICD-10-CM | POA: Diagnosis not present

## 2024-09-24 DIAGNOSIS — I2510 Atherosclerotic heart disease of native coronary artery without angina pectoris: Secondary | ICD-10-CM | POA: Diagnosis not present

## 2024-09-24 DIAGNOSIS — I132 Hypertensive heart and chronic kidney disease with heart failure and with stage 5 chronic kidney disease, or end stage renal disease: Secondary | ICD-10-CM | POA: Diagnosis not present

## 2024-09-24 LAB — CBC
HCT: 39.4 % (ref 36.0–46.0)
Hemoglobin: 12.4 g/dL (ref 12.0–15.0)
MCH: 28.2 pg (ref 26.0–34.0)
MCHC: 31.5 g/dL (ref 30.0–36.0)
MCV: 89.5 fL (ref 80.0–100.0)
Platelets: 149 K/uL — ABNORMAL LOW (ref 150–400)
RBC: 4.4 MIL/uL (ref 3.87–5.11)
RDW: 15.1 % (ref 11.5–15.5)
WBC: 6.5 K/uL (ref 4.0–10.5)
nRBC: 0 % (ref 0.0–0.2)

## 2024-09-24 LAB — BASIC METABOLIC PANEL WITH GFR
Anion gap: 15 (ref 5–15)
BUN: 26 mg/dL — ABNORMAL HIGH (ref 8–23)
CO2: 26 mmol/L (ref 22–32)
Calcium: 8.8 mg/dL — ABNORMAL LOW (ref 8.9–10.3)
Chloride: 96 mmol/L — ABNORMAL LOW (ref 98–111)
Creatinine, Ser: 4.68 mg/dL — ABNORMAL HIGH (ref 0.44–1.00)
GFR, Estimated: 10 mL/min — ABNORMAL LOW (ref >60)
Glucose, Bld: 223 mg/dL — ABNORMAL HIGH (ref 70–99)
Potassium: 3.5 mmol/L (ref 3.5–5.1)
Sodium: 137 mmol/L (ref 135–145)

## 2024-09-24 MED ORDER — ONDANSETRON 4 MG PO TBDP
4.0000 mg | ORAL_TABLET | Freq: Once | ORAL | Status: AC
  Administered 2024-09-24: 4 mg via ORAL
  Filled 2024-09-24: qty 1

## 2024-09-24 MED ORDER — OXYCODONE-ACETAMINOPHEN 5-325 MG PO TABS
1.0000 | ORAL_TABLET | Freq: Once | ORAL | Status: AC
  Administered 2024-09-24: 1 via ORAL
  Filled 2024-09-24: qty 1

## 2024-09-24 NOTE — ED Triage Notes (Addendum)
 First nurse note: Pt to ED via [facility] from home. Pt reports right arm pain and more below the elbow. Pt has fistula in RUQ that was accessed today. Pain 9/10. Hx of CHF, asthma and DM  60 HR  98% RA 137/68

## 2024-09-24 NOTE — ED Triage Notes (Signed)
 Pt brought in via ems from home.  Pt had dialysis today.  Pt reports pain in right arm.  Pt wearing a sling/ace wrap to right hand.   denies injury to arm.  Pain for 3 days.    No chest pain or sob.  Pt alert

## 2024-09-24 NOTE — ED Provider Notes (Signed)
 [facility] Emergency Department Provider Note     Event Date/Time   First MD Initiated Contact with Patient 09/24/24 2047     (approximate)   History   Arm Pain   HPI  Terry Arroyo is a 63 y.o. right-handed female with a history of HTN, HLD, GERD, asthma, CAD, DVT, COPD, heart failure, ESRD on dialysis ([facility]) who presents to the ED endorsing acute pain to the right wrist and dorsal forearm.  Patient denies any preceding injury, trauma, or fall.  She notes onset of symptoms on Saturday.  Without provocation she began to experience some pain and disability primarily to the dorsal wrist.  She presents now with some swelling and exquisite pain and tenderness over the dorsal wrist.  She denies any history of gout, and denies any known arthritis joint pain.  Patient denies any fevers, chills, sweats.  No chest pain or shortness of breath reported.  She was able to dialyze on Saturday without difficulty.  She presents to the ED via EMS from home for evaluation of this acute wrist right pain and disability.  Physical Exam   Triage Vital Signs: ED Triage Vitals  Encounter Vitals Group     BP 09/24/24 1742 139/60     Girls Systolic BP Percentile --      Girls Diastolic BP Percentile --      Boys Systolic BP Percentile --      Boys Diastolic BP Percentile --      Pulse Rate 09/24/24 1742 61     Resp 09/24/24 1742 20     Temp 09/24/24 1742 99.5 F (37.5 C)     Temp Source 09/24/24 1742 Oral     SpO2 09/24/24 1742 99 %     Weight 09/24/24 1743 178 lb (80.7 kg)     Height 09/24/24 1743 5' 6 (1.676 m)     Head Circumference --      Peak Flow --      Pain Score 09/24/24 1742 9     Pain Loc --      Pain Education --      Exclude from Growth Chart --     Most recent vital signs: Vitals:   09/24/24 2244 09/25/24 0058  BP: (!) 152/67 (!) 147/73  Pulse: 65 69  Resp: 18 18  Temp: 98.1 F (36.7 C)   SpO2: 97% 99%    General Awake, no distress.  NAD HEENT NCAT. PERRL. EOMI. No rhinorrhea. Mucous membranes are moist.  CV:  Good peripheral perfusion. No CCE distally RESP:  Normal effort. CTA MSK:  Right wrist without obvious deformity, dislocation, or effusion.  Soft tissue swelling noted dorsally at the wrist.  Mild increased warmth and exquisite tenderness to palpation.  No erythema noted.  Patient with decreased active range of motion secondary to pain.  Similar pain to the dorsal extensors due to pain   ED Results / Procedures / Treatments   Labs (all labs ordered are listed, but only abnormal results are displayed) Labs Reviewed  BASIC METABOLIC PANEL WITH GFR - Abnormal; Notable for the following components:      Result Value   Chloride 96 (*)    Glucose, Bld 223 (*)    BUN 26 (*)    Creatinine, Ser 4.68 (*)    Calcium  8.8 (*)    GFR, Estimated 10 (*)    All other components within normal limits  CBC - Abnormal; Notable for the following components:  Platelets 149 (*)    All other components within normal limits  URIC ACID    EKG   RADIOLOGY  I personally viewed and evaluated these images as part of my medical decision making, as well as reviewing the written report by the radiologist.  ED Provider Interpretation: No acute bony abnormality to the elbow or forearm; degenerative changes noted to the elbow and wrist  US  Venous Img Upper Uni Right(DVT) Result Date: 09/24/2024 EXAM: US  Duplex right Upper Extremity Veins. TECHNIQUE: Real-time ultrasound scan of the veins of the right upper extremity with color Doppler flow, spectral waveform analysis and compression. COMPARISON: None. CLINICAL HISTORY: Pain. FINDINGS: SUPERFICIAL VEINS: No findings are reported for the superficial veins. DEEP VEINS: The right internal jugular, subclavian, and axillary veins are widely patent with appropriate antegrade flow and respiratory variation. The visualized brachial veins of the mid right upper extremity are patent. SOFT TISSUES:  No acute finding. LIMITATIONS/ARTIFACTS: The vessels peripheral to the right axillary vein were not assessed on this examination due to patient intolerance of the examination and refusal to proceed. IMPRESSION: 1. No deep venous thrombosis in the central  right upper extremity. 2. Limited evaluation of the vessels peripheral to the right axillary vein due to patient intolerance and refusal to proceed. Electronically signed by: Dorethia Molt MD 09/24/2024 11:49 PM EDT RP Workstation: [id number]   DG Elbow Complete Right Result Date: 09/24/2024 CLINICAL DATA:  Right arm pain below the elbow starting Saturday. Pain above the dialysis site to the wrist. EXAM: RIGHT ELBOW - COMPLETE 3+ VIEW COMPARISON:  Right forearm 09/24/2024 FINDINGS: Degenerative changes in the right elbow with prominent osteophyte formation on the radial head/neck, coronary process, and proximal ulna. Cortical irregularity seen on forearm images corresponds to osteophyte formation. No evidence of acute fracture or dislocation of the right elbow. No significant effusions. A vascular graft is demonstrated in the soft tissues of the right arm above the elbow. No soft tissue gas. IMPRESSION: Moderate degenerative changes in the right elbow. No acute fracture or dislocation. Electronically Signed   By: Elsie Gravely M.D.   On: 09/24/2024 21:15   DG Forearm Right Result Date: 09/24/2024 CLINICAL DATA:  Pain below the elbow. Patient is unable to move the arm. No reported injury. EXAM: RIGHT FOREARM - 2 VIEW COMPARISON:  None Available. FINDINGS: Degenerative changes are demonstrated in the elbow and wrist joints. Focal cortical irregularity over the radial head is poorly visualized on forearm views and could represent degenerative osteophyte formation or a small cortical fracture fragment. Recommend dedicated views of the right elbow for further evaluation. Remainder of the right radius and ulna appear intact. Soft tissues are unremarkable.  IMPRESSION: 1. Possible cortical fragment over the radial head. Recommend dedicated views of the right elbow for further evaluation. 2. Degenerative changes in the right wrist and right elbow. Electronically Signed   By: Elsie Gravely M.D.   On: 09/24/2024 18:30     PROCEDURES:  Critical Care performed: No  Procedures   MEDICATIONS ORDERED IN ED: Medications  ondansetron  (ZOFRAN -ODT) disintegrating tablet 4 mg (4 mg Oral Given 09/24/24 2338)  oxyCODONE -acetaminophen  (PERCOCET/ROXICET) 5-325 MG per tablet 1 tablet (1 tablet Oral Given 09/24/24 2337)     IMPRESSION / MDM / ASSESSMENT AND PLAN / ED COURSE  I reviewed the triage vital signs and the nursing notes.  Differential diagnosis includes, but is not limited to, DJD, OA, gouty arthritis, tendinitis, septic arthritis  Patient's presentation is most consistent with acute complicated illness / injury requiring diagnostic workup.  Patient's diagnosis is consistent with acute right wrist pain likely due to gouty arthritis.  Patient with exam consistent with exquisite tenderness to palpation over the dorsal wrist.  Decreased range secondary to pain.  Labs overall reassuring without any acute leukocytosis or critical anemia.  CKD again demonstrated.  Plain film reviewed by me, showed no acute fracture or dislocation, but evidence of both wrist and elbow degenerative changes.  Low concern for DVT of the RUE as patient has normal blood flow for the patient-limited study.  Symptoms likely representing acute inflammatory arthritis. Low suspicion for septic joint. Patient will be discharged home with prescriptions for Zofran  and Percocet. Patient is to follow up with her PCP as suggested, as needed or otherwise directed. Patient is given ED precautions to return to the ED for any worsening or new symptoms.   FINAL CLINICAL IMPRESSION(S) / ED DIAGNOSES   Final diagnoses:  Acute gout of right wrist, unspecified  cause  Acute pain of right wrist     Rx / DC Orders   ED Discharge Orders          Ordered    oxyCODONE -acetaminophen  (PERCOCET) 5-325 MG tablet  Every 8 hours PRN,   Status:  Discontinued        09/25/24 0025    ondansetron  (ZOFRAN -ODT) 4 MG disintegrating tablet  Every 8 hours PRN,   Status:  Discontinued        09/25/24 0025    oxyCODONE -acetaminophen  (PERCOCET) 5-325 MG tablet  Every 8 hours PRN        09/25/24 0045    ondansetron  (ZOFRAN -ODT) 4 MG disintegrating tablet  Every 8 hours PRN        09/25/24 0045             Note:  This document was prepared using Dragon voice recognition software and may include unintentional dictation errors.    Loyd Candida LULLA Aldona, PA-C 09/25/24 1522    Levander Slate, MD 09/25/24 [phone]

## 2024-09-25 LAB — URIC ACID: Uric Acid, Serum: 3.7 mg/dL (ref 2.5–7.1)

## 2024-09-25 MED ORDER — ONDANSETRON 4 MG PO TBDP: 4.0000 mg | ORAL_TABLET | Freq: Three times a day (TID) | ORAL | 0 refills | Status: DC | PRN

## 2024-09-25 MED ORDER — ONDANSETRON 4 MG PO TBDP: 4.0000 mg | ORAL_TABLET | Freq: Three times a day (TID) | ORAL | 0 refills | Status: AC | PRN

## 2024-09-25 MED ORDER — OXYCODONE-ACETAMINOPHEN 5-325 MG PO TABS: 1.0000 | ORAL_TABLET | Freq: Three times a day (TID) | ORAL | 0 refills | Status: DC | PRN

## 2024-09-25 MED ORDER — OXYCODONE-ACETAMINOPHEN 5-325 MG PO TABS: 1.0000 | ORAL_TABLET | Freq: Three times a day (TID) | ORAL | 0 refills | Status: AC | PRN

## 2024-09-25 NOTE — Discharge Instructions (Addendum)
 Your exam and x-ray are normal and reassuring at this time.  No signs of a fracture or dislocation.  You are being treated with pain medicines for presumed gout flare to the wrist.  Follow-up with your primary provider for ongoing evaluation.  Return to the ED if needed.

## 2024-10-13 ENCOUNTER — Other Ambulatory Visit (HOSPITAL_COMMUNITY): Payer: Self-pay

## 2024-10-30 ENCOUNTER — Other Ambulatory Visit (HOSPITAL_COMMUNITY): Payer: Self-pay

## 2024-11-06 ENCOUNTER — Other Ambulatory Visit: Payer: Self-pay | Admitting: Family

## 2025-02-21 ENCOUNTER — Ambulatory Visit: Admission: RE | Admit: 2025-02-21 | Source: Home / Self Care | Admitting: Gastroenterology

## 2025-02-21 ENCOUNTER — Encounter: Admission: RE | Payer: Self-pay | Source: Home / Self Care
